# Patient Record
Sex: Male | Born: 1960 | Race: Black or African American | Hispanic: No | Marital: Married | Smoking: Former smoker
Health system: Southern US, Community
[De-identification: ages and names within clinical notes are randomized; demographics above are authoritative.]

## PROBLEM LIST (undated history)

## (undated) DIAGNOSIS — D751 Secondary polycythemia: Secondary | ICD-10-CM

## (undated) DIAGNOSIS — N200 Calculus of kidney: Secondary | ICD-10-CM

## (undated) DIAGNOSIS — D72829 Elevated white blood cell count, unspecified: Secondary | ICD-10-CM

## (undated) DIAGNOSIS — Z720 Tobacco use: Secondary | ICD-10-CM

## (undated) DIAGNOSIS — I10 Essential (primary) hypertension: Secondary | ICD-10-CM

## (undated) DIAGNOSIS — Z972 Presence of dental prosthetic device (complete) (partial): Secondary | ICD-10-CM

## (undated) DIAGNOSIS — F419 Anxiety disorder, unspecified: Secondary | ICD-10-CM

## (undated) DIAGNOSIS — R51 Headache: Secondary | ICD-10-CM

## (undated) DIAGNOSIS — E041 Nontoxic single thyroid nodule: Secondary | ICD-10-CM

## (undated) DIAGNOSIS — D3501 Benign neoplasm of right adrenal gland: Secondary | ICD-10-CM

## (undated) DIAGNOSIS — R6889 Other general symptoms and signs: Secondary | ICD-10-CM

## (undated) DIAGNOSIS — M94269 Chondromalacia, unspecified knee: Secondary | ICD-10-CM

## (undated) DIAGNOSIS — S83249A Other tear of medial meniscus, current injury, unspecified knee, initial encounter: Secondary | ICD-10-CM

## (undated) DIAGNOSIS — E039 Hypothyroidism, unspecified: Secondary | ICD-10-CM

## (undated) DIAGNOSIS — Z87442 Personal history of urinary calculi: Secondary | ICD-10-CM

## (undated) DIAGNOSIS — D649 Anemia, unspecified: Secondary | ICD-10-CM

## (undated) DIAGNOSIS — K219 Gastro-esophageal reflux disease without esophagitis: Secondary | ICD-10-CM

## (undated) DIAGNOSIS — M2560 Stiffness of unspecified joint, not elsewhere classified: Secondary | ICD-10-CM

## (undated) DIAGNOSIS — M1990 Unspecified osteoarthritis, unspecified site: Secondary | ICD-10-CM

## (undated) HISTORY — PX: COLONOSCOPY: SHX174

## (undated) HISTORY — DX: Elevated white blood cell count, unspecified: D72.829

## (undated) HISTORY — PX: INGUINAL HERNIA REPAIR: SHX194

## (undated) HISTORY — DX: Calculus of kidney: N20.0

## (undated) HISTORY — DX: Anemia, unspecified: D64.9

## (undated) HISTORY — PX: ABDOMINAL SURGERY: SHX537

## (undated) HISTORY — DX: Secondary polycythemia: D75.1

## (undated) HISTORY — PX: APPENDECTOMY: SHX54

## (undated) HISTORY — PX: UPPER GASTROINTESTINAL ENDOSCOPY: SHX188

## (undated) HISTORY — PX: EYE SURGERY: SHX253

---

## 1999-11-02 ENCOUNTER — Emergency Department (HOSPITAL_COMMUNITY): Admission: EM | Admit: 1999-11-02 | Discharge: 1999-11-02 | Payer: Self-pay | Admitting: Emergency Medicine

## 2000-02-26 ENCOUNTER — Other Ambulatory Visit: Admission: RE | Admit: 2000-02-26 | Discharge: 2000-02-26 | Payer: Self-pay | Admitting: Gastroenterology

## 2000-02-26 ENCOUNTER — Encounter (INDEPENDENT_AMBULATORY_CARE_PROVIDER_SITE_OTHER): Payer: Self-pay | Admitting: Specialist

## 2000-04-08 ENCOUNTER — Encounter (HOSPITAL_BASED_OUTPATIENT_CLINIC_OR_DEPARTMENT_OTHER): Payer: Self-pay | Admitting: General Surgery

## 2000-04-13 ENCOUNTER — Inpatient Hospital Stay (HOSPITAL_COMMUNITY): Admission: RE | Admit: 2000-04-13 | Discharge: 2000-05-11 | Payer: Self-pay | Admitting: General Surgery

## 2000-04-13 ENCOUNTER — Encounter (INDEPENDENT_AMBULATORY_CARE_PROVIDER_SITE_OTHER): Payer: Self-pay | Admitting: *Deleted

## 2000-04-13 HISTORY — PX: HEMICOLECTOMY: SHX854

## 2000-04-16 ENCOUNTER — Encounter (HOSPITAL_BASED_OUTPATIENT_CLINIC_OR_DEPARTMENT_OTHER): Payer: Self-pay | Admitting: General Surgery

## 2000-04-19 ENCOUNTER — Encounter (HOSPITAL_BASED_OUTPATIENT_CLINIC_OR_DEPARTMENT_OTHER): Payer: Self-pay | Admitting: General Surgery

## 2000-04-20 ENCOUNTER — Encounter (HOSPITAL_BASED_OUTPATIENT_CLINIC_OR_DEPARTMENT_OTHER): Payer: Self-pay | Admitting: General Surgery

## 2000-04-21 ENCOUNTER — Encounter (HOSPITAL_BASED_OUTPATIENT_CLINIC_OR_DEPARTMENT_OTHER): Payer: Self-pay | Admitting: General Surgery

## 2000-04-23 ENCOUNTER — Encounter (HOSPITAL_BASED_OUTPATIENT_CLINIC_OR_DEPARTMENT_OTHER): Payer: Self-pay | Admitting: General Surgery

## 2000-04-24 ENCOUNTER — Encounter (HOSPITAL_BASED_OUTPATIENT_CLINIC_OR_DEPARTMENT_OTHER): Payer: Self-pay | Admitting: General Surgery

## 2000-04-24 HISTORY — PX: SMALL INTESTINE SURGERY: SHX150

## 2000-05-05 ENCOUNTER — Encounter (HOSPITAL_BASED_OUTPATIENT_CLINIC_OR_DEPARTMENT_OTHER): Payer: Self-pay | Admitting: General Surgery

## 2000-09-07 ENCOUNTER — Emergency Department (HOSPITAL_COMMUNITY): Admission: EM | Admit: 2000-09-07 | Discharge: 2000-09-08 | Payer: Self-pay | Admitting: Emergency Medicine

## 2000-09-08 ENCOUNTER — Encounter: Payer: Self-pay | Admitting: Emergency Medicine

## 2001-10-04 ENCOUNTER — Encounter: Payer: Self-pay | Admitting: Gastroenterology

## 2001-10-05 ENCOUNTER — Encounter: Payer: Self-pay | Admitting: Gastroenterology

## 2001-10-05 ENCOUNTER — Encounter: Admission: RE | Admit: 2001-10-05 | Discharge: 2001-10-05 | Payer: Self-pay | Admitting: Gastroenterology

## 2003-10-09 ENCOUNTER — Emergency Department (HOSPITAL_COMMUNITY): Admission: EM | Admit: 2003-10-09 | Discharge: 2003-10-09 | Payer: Self-pay | Admitting: Emergency Medicine

## 2004-08-02 ENCOUNTER — Encounter: Admission: RE | Admit: 2004-08-02 | Discharge: 2004-08-02 | Payer: Self-pay | Admitting: Internal Medicine

## 2004-08-06 ENCOUNTER — Encounter: Admission: RE | Admit: 2004-08-06 | Discharge: 2004-08-06 | Payer: Self-pay | Admitting: Internal Medicine

## 2005-01-06 ENCOUNTER — Ambulatory Visit: Payer: Self-pay | Admitting: Gastroenterology

## 2005-01-13 ENCOUNTER — Ambulatory Visit: Payer: Self-pay | Admitting: Gastroenterology

## 2006-05-04 ENCOUNTER — Ambulatory Visit (HOSPITAL_BASED_OUTPATIENT_CLINIC_OR_DEPARTMENT_OTHER): Admission: RE | Admit: 2006-05-04 | Discharge: 2006-05-04 | Payer: Self-pay | Admitting: Urology

## 2006-05-04 HISTORY — PX: CIRCUMCISION: SHX1350

## 2008-02-01 ENCOUNTER — Emergency Department (HOSPITAL_COMMUNITY): Admission: EM | Admit: 2008-02-01 | Discharge: 2008-02-01 | Payer: Self-pay | Admitting: Emergency Medicine

## 2008-02-01 ENCOUNTER — Ambulatory Visit (HOSPITAL_COMMUNITY): Admission: RE | Admit: 2008-02-01 | Discharge: 2008-02-01 | Payer: Self-pay | Admitting: Family Medicine

## 2008-07-24 ENCOUNTER — Ambulatory Visit: Payer: Self-pay | Admitting: Internal Medicine

## 2008-07-24 ENCOUNTER — Telehealth: Payer: Self-pay | Admitting: Gastroenterology

## 2008-07-24 DIAGNOSIS — Z86010 Personal history of colonic polyps: Secondary | ICD-10-CM | POA: Insufficient documentation

## 2008-07-24 DIAGNOSIS — K219 Gastro-esophageal reflux disease without esophagitis: Secondary | ICD-10-CM | POA: Insufficient documentation

## 2008-07-25 LAB — CONVERTED CEMR LAB
Basophils Absolute: 0.1 10*3/uL (ref 0.0–0.1)
Basophils Relative: 0.4 % (ref 0.0–3.0)
Eosinophils Absolute: 0.1 10*3/uL (ref 0.0–0.7)
Eosinophils Relative: 0.9 % (ref 0.0–5.0)
HCT: 41.5 % (ref 39.0–52.0)
Hemoglobin: 14.3 g/dL (ref 13.0–17.0)
Lymphocytes Relative: 30.7 % (ref 12.0–46.0)
MCHC: 34.5 g/dL (ref 30.0–36.0)
MCV: 84.9 fL (ref 78.0–100.0)
Monocytes Absolute: 0.7 10*3/uL (ref 0.1–1.0)
Monocytes Relative: 5.6 % (ref 3.0–12.0)
Neutro Abs: 8 10*3/uL — ABNORMAL HIGH (ref 1.4–7.7)
Neutrophils Relative %: 62.4 % (ref 43.0–77.0)
Platelets: 303 10*3/uL (ref 150–400)
RBC: 4.89 M/uL (ref 4.22–5.81)
RDW: 13.3 % (ref 11.5–14.6)
WBC: 12.9 10*3/uL — ABNORMAL HIGH (ref 4.5–10.5)

## 2008-12-06 ENCOUNTER — Emergency Department (HOSPITAL_COMMUNITY): Admission: EM | Admit: 2008-12-06 | Discharge: 2008-12-06 | Payer: Self-pay | Admitting: Family Medicine

## 2009-05-01 ENCOUNTER — Inpatient Hospital Stay (HOSPITAL_COMMUNITY): Admission: EM | Admit: 2009-05-01 | Discharge: 2009-05-05 | Payer: Self-pay | Admitting: Emergency Medicine

## 2009-05-01 ENCOUNTER — Encounter (INDEPENDENT_AMBULATORY_CARE_PROVIDER_SITE_OTHER): Payer: Self-pay | Admitting: *Deleted

## 2009-05-02 ENCOUNTER — Encounter (INDEPENDENT_AMBULATORY_CARE_PROVIDER_SITE_OTHER): Payer: Self-pay | Admitting: *Deleted

## 2009-05-03 ENCOUNTER — Encounter (INDEPENDENT_AMBULATORY_CARE_PROVIDER_SITE_OTHER): Payer: Self-pay | Admitting: *Deleted

## 2009-05-04 ENCOUNTER — Encounter (INDEPENDENT_AMBULATORY_CARE_PROVIDER_SITE_OTHER): Payer: Self-pay | Admitting: *Deleted

## 2009-06-25 ENCOUNTER — Ambulatory Visit: Payer: Self-pay | Admitting: Gastroenterology

## 2009-12-06 ENCOUNTER — Encounter (INDEPENDENT_AMBULATORY_CARE_PROVIDER_SITE_OTHER): Payer: Self-pay | Admitting: *Deleted

## 2010-06-08 ENCOUNTER — Emergency Department (HOSPITAL_COMMUNITY): Admission: EM | Admit: 2010-06-08 | Discharge: 2010-06-08 | Payer: Self-pay | Admitting: Emergency Medicine

## 2010-07-16 ENCOUNTER — Ambulatory Visit (HOSPITAL_COMMUNITY): Admission: RE | Admit: 2010-07-16 | Discharge: 2010-07-17 | Payer: Self-pay | Admitting: Orthopedic Surgery

## 2010-07-16 HISTORY — PX: SHOULDER ARTHROSCOPY W/ ROTATOR CUFF REPAIR: SHX2400

## 2010-09-11 ENCOUNTER — Encounter (INDEPENDENT_AMBULATORY_CARE_PROVIDER_SITE_OTHER): Payer: Self-pay | Admitting: *Deleted

## 2010-09-11 ENCOUNTER — Telehealth: Payer: Self-pay | Admitting: Gastroenterology

## 2010-09-18 ENCOUNTER — Encounter (INDEPENDENT_AMBULATORY_CARE_PROVIDER_SITE_OTHER): Payer: Self-pay | Admitting: *Deleted

## 2010-09-25 ENCOUNTER — Ambulatory Visit: Payer: Self-pay | Admitting: Gastroenterology

## 2010-10-07 ENCOUNTER — Telehealth: Payer: Self-pay | Admitting: Gastroenterology

## 2010-10-09 ENCOUNTER — Ambulatory Visit: Payer: Self-pay | Admitting: Gastroenterology

## 2010-10-16 ENCOUNTER — Encounter: Payer: Self-pay | Admitting: Gastroenterology

## 2010-11-26 NOTE — Miscellaneous (Signed)
Summary: [facility] PREvisit/prep  Clinical Lists Changes  Medications: Added new medication of MOVIPREP 100 GM  SOLR (PEG-KCL-NACL-NASULF-NA ASC-C) As per prep instructions. - Signed Rx of MOVIPREP 100 GM  SOLR (PEG-KCL-NACL-NASULF-NA ASC-C) As per prep instructions.;  #1 x 0;  Signed;  Entered by: Wyona Almas RN;  Authorized by: Meryl Dare MD Clementeen Graham;  Method used: Electronically to [id number]*, [address] [place], [place], [place]  [address], Ph: [phone] or [phone], Fax: [phone] Allergies: Changed allergy or adverse reaction from PENICILLIN to PENICILLIN Observations: Added new observation of ALLERGY REV: Done (09/25/2010 13:26)    Prescriptions: MOVIPREP 100 GM  SOLR (PEG-KCL-NACL-NASULF-NA ASC-C) As per prep instructions.  #1 x 0   Entered by:   Wyona Almas RN   Authorized by:   Meryl Dare MD [facility]   Signed by:   Wyona Almas RN on 09/25/2010   Method used:   Electronically to        [facility] [phone]* (retail)       [address]       [place], [place]  [address]       Ph: [phone] or [phone]       Fax: [phone]   RxID:   [id number]

## 2010-11-26 NOTE — Letter (Signed)
Summary: Pre Visit Letter Revised  [facility]  [address] [place], [place] [address]   Phone: [phone]  Fax: [phone]        09/11/2010 MRN: [id number] [name] [name] [address] [place], [place]  [address]             Procedure Date:  10/09/2010  Welcome to the Gastroenterology Division at [facility].    You are scheduled to see a nurse for your pre-procedure visit on 09/23/2010 at 1:00PM on the 3rd floor at [facility], [address]  We ask that you try to arrive at our office 15 minutes prior to your appointment time to allow for check-in.  Please take a minute to review the attached form.  If you answer "Yes" to one or more of the questions on the first page, we ask that you call the person listed at your earliest opportunity.  If you answer "No" to all of the questions, please complete the rest of the form and bring it to your appointment.    Your nurse visit will consist of discussing your medical and surgical history, your immediate family medical history, and your medications.   If you are unable to list all of your medications on the form, please bring the medication bottles to your appointment and we will list them.  We will need to be aware of both prescribed and over the counter drugs.  We will need to know exact dosage information as well.    Please be prepared to read and sign documents such as consent forms, a financial agreement, and acknowledgement forms.  If necessary, and with your consent, a friend or relative is welcome to sit-in on the nurse visit with you.  Please bring your insurance card so that we may make a copy of it.  If your insurance requires a referral to see a specialist, please bring your referral form from your primary care physician.  No co-pay is required for this nurse visit.     If you cannot keep your appointment, please call [phone] to cancel or reschedule prior to your appointment date.  This allows  [name] the opportunity to schedule an appointment for another patient in need of care.    Thank you for choosing [facility] for your medical needs.  We appreciate the opportunity to care for you.  Please visit [name] at our website  to learn more about our practice.  Sincerely, The Gastroenterology Division

## 2010-11-26 NOTE — Letter (Signed)
Summary: Colonoscopy Letter  [facility]  [address] [place], [place] [address]   Phone: [phone]  Fax: [phone]      December 06, 2009 MRN: [id number]   [facility] [address] [place], [place]  [address]   Dear [name] [name],   According to your medical record, it is time for you to schedule a Colonoscopy. The American Cancer Society recommends this procedure as a method to detect early colon cancer. Patients with a family history of colon cancer, or a personal history of colon polyps or inflammatory bowel disease are at increased risk.  This letter has beeen generated based on the recommendations made at the time of your procedure. If you feel that in your particular situation this may no longer apply, please contact our office.  Please call our office at [phone] to schedule this appointment or to update your records at your earliest convenience.  Thank you for cooperating with [name] to provide you with the very best care possible.   Sincerely,  Judie Petit T. Russella Dar, M.D.  [facility] Gastroenterology Division [phone]

## 2010-11-26 NOTE — Letter (Signed)
Summary: [facility] Instructions  [name] [name]  [address] [place], [place] [address]   Phone: [phone]  Fax: [phone]       [name]    01-17-1961    MRN: [id number]        Procedure Day [name] [name]:  [facility]  10/09/10     Arrival Time:  2:00PM     Procedure Time:  3:00PM     [facility] of Procedure:                    [facility]                      PREPARATION FOR COLONOSCOPY WITH MOVIPREP   Starting 5 days prior to your procedure 12/9/11do not eat nuts, seeds, popcorn, corn, beans, peas,  salads, or any raw vegetables.  Do not take any fiber supplements (e.g. Metamucil, Citrucel, and Benefiber).  THE DAY BEFORE YOUR PROCEDURE         DATE: 10/08/10   DAY: TUESDAY  1.  Drink clear liquids the entire day-NO SOLID FOOD  2.  Do not drink anything colored red or purple.  Avoid juices with pulp.  No orange juice.  3.  Drink at least 64 oz. (8 glasses) of fluid/clear liquids during the day to prevent dehydration and help the prep work efficiently.  CLEAR LIQUIDS INCLUDE: Water Jello Ice Popsicles Tea (sugar ok, no milk/cream) Powdered fruit flavored drinks Coffee (sugar ok, no milk/cream) Gatorade Juice: apple, white grape, white cranberry  Lemonade Clear bullion, consomm, broth Carbonated beverages (any kind) Strained chicken noodle soup Hard Candy                             4.  In the morning, mix first dose of MoviPrep solution:    Empty 1 Pouch A and 1 Pouch B into the disposable container    Add lukewarm drinking water to the top line of the container. Mix to dissolve    Refrigerate (mixed solution should be used within 24 hrs)  5.  Begin drinking the prep at 5:00 p.m. The MoviPrep container is divided by 4 marks.   Every 15 minutes drink the solution down to the next [name] [name] (approximately 8 oz) until the full liter is complete.   6.  Follow completed prep with 16 oz of clear liquid of your choice (Nothing  red or purple).  Continue to drink clear liquids until bedtime.  7.  Before going to bed, mix second dose of MoviPrep solution:    Empty 1 Pouch A and 1 Pouch B into the disposable container    Add lukewarm drinking water to the top line of the container. Mix to dissolve    Refrigerate  THE DAY OF YOUR PROCEDURE      DATE: 10/09/10   DAY: WEDNESDAY  Beginning at 10:00AM (5 hours before procedure):         1. Every 15 minutes, drink the solution down to the next [name] [name] (approx 8 oz) until the full liter is complete.  2. Follow completed prep with 16 oz. of clear liquid of your choice.    3. You may drink clear liquids until 1:00PM (2 HOURS BEFORE PROCEDURE).   MEDICATION INSTRUCTIONS  Unless otherwise instructed, you should take regular prescription medications with a small sip of water   as early as possible the morning of  your procedure.          OTHER INSTRUCTIONS  You will need a responsible adult at least 51 years of age to accompany you and drive you home.   This person must remain in the waiting room during your procedure.  Wear loose fitting clothing that is easily removed.  Leave jewelry and other valuables at home.  However, you may wish to bring a book to read or  an iPod/MP3 player to listen to music as you wait for your procedure to start.  Remove all body piercing jewelry and leave at home.  Total time from sign-in until discharge is approximately 2-3 hours.  You should go home directly after your procedure and rest.  You can resume normal activities the  day after your procedure.  The day of your procedure you should not:   Drive   Make legal decisions   Operate machinery   Drink alcohol   Return to work  You will receive specific instructions about eating, activities and medications before you leave.    The above instructions have been reviewed and explained to me by   [name] Almas RN  September 25, 2010 2:08 PM     I fully  understand and can verbalize these instructions _____________________________ Date _________

## 2010-11-26 NOTE — Progress Notes (Signed)
Summary: Wants to schedule ECL asap  Phone Note Call from Patient Call back at cell [phone]   Call For: Dr Russella Dar Reason for Call: Talk to Nurse Summary of Call: Is having alot of indigestion. Wanted to schedule his Colon that is overdue  & wonders if he can get Endo at the same time directly as soon as possible before end of year? Initial call taken by: Leanor Kail [facility],  September 11, 2010 10:12 AM  Follow-up for Phone Call        Left message for patient to call back Darcey Nora RN, [facility]  September 11, 2010 10:14 AM  Patient has 5 month hx of indigestion controlled with Prilosec, but has problems if he skips a dose.  Patient recently had a rotator cuff repair, but says he hasn't been taking or prescribed NSAIDS.  Patient  reports blood in his stool.  He is overdue for his colon.  he would like to schedule both procedures directly.  Dr Russella Dar please advise. Follow-up by: Darcey Nora RN, CGRN,  September 11, 2010 11:11 AM  Additional Follow-up for Phone Call Additional follow up Details #1::        OK for EGD with COLON Additional Follow-up by: Meryl Dare MD Clementeen Graham,  September 11, 2010 11:51 AM    Additional Follow-up for Phone Call Additional follow up Details #2::    Left message for patient to call back Darcey Nora RN, [facility]  September 11, 2010 3:36 PM  Colon/endo scheduled for 10/09/10 3:00 pre-visit 09/23/10 Follow-up by: Darcey Nora RN, CGRN,  September 11, 2010 4:18 PM

## 2010-11-28 NOTE — Progress Notes (Signed)
Summary: Medication  Phone Note Call from Patient Call back at Home Phone [phone]   Caller: Patient Call For: Dr. Russella Dar Reason for Call: Talk to Nurse Summary of Call: Moviprep is too expensive...needs an alternative med Initial call taken by: Karna Christmas,  October 07, 2010 10:49 AM  Follow-up for Phone Call        Pt states he cannot afford the Movi prep. Told pt that he could have the Golytely prep as an alternate prep kit but needs to come by the office today to get new prep instructions. Pt states he will come by at 3:00pm today to get the new instructions.  Follow-up by: Christie Nottingham CMA [facility]ull),  October 07, 2010 12:34 PM    New/Updated Medications: PEG 3350/ELECTROLYTES 240 GM SOLR (PEG 3350-KCL-NABCB-NACL-NASULF) use as directed for preparation for colonoscopy DULCOLAX 5 MG  TBEC (BISACODYL) take as directed REGLAN 10 MG  TABS (METOCLOPRAMIDE HCL) As per prep instructions. Prescriptions: PEG 3350/ELECTROLYTES 240 GM SOLR (PEG 3350-KCL-NABCB-NACL-NASULF) use as directed for preparation for colonoscopy  #1 x 0   Entered by:   Christie Nottingham CMA (AAMA)   Authorized by:   Meryl Dare MD [facility]   Signed by:   Christie Nottingham CMA (AAMA) on 10/07/2010   Method used:   Electronically to        [facility] [phone]* (retail)       [address]       [place], [place]  [address]       Ph: [phone] or [phone]       Fax: [phone]   RxID:   [id number] REGLAN 10 MG  TABS (METOCLOPRAMIDE HCL) As per prep instructions.  #2 x 0   Entered by:   Christie Nottingham CMA (AAMA)   Authorized by:   Meryl Dare MD [facility]   Signed by:   Christie Nottingham CMA (AAMA) on 10/07/2010   Method used:   Electronically to        [facility] [phone]* (retail)       [address]       [place], [place]  [address]       Ph: [phone] or [phone]       Fax: [phone]   RxID:    [id number] DULCOLAX 5 MG  TBEC (BISACODYL) take as directed  #2 x 0   Entered by:   Christie Nottingham CMA (AAMA)   Authorized by:   Meryl Dare MD [facility]   Signed by:   Christie Nottingham CMA (AAMA) on 10/07/2010   Method used:   Electronically to        [facility] [phone]* (retail)       [address]       [place], [place]  [address]       Ph: [phone] or [phone]       Fax: [phone]   RxID:   [id number]

## 2010-11-28 NOTE — Letter (Signed)
Summary: Patient Notice- Colon Biospy Results  [facility]  [address] [place], [place] [address]   Phone: [phone]  Fax: [phone]        October 16, 2010 MRN: [id number]    [facility] [address] [place], [place]  [address]    Dear [name] [name],  I am pleased to inform you that the biopsies taken during your recent colonoscopy and uppper endoscopy did not show any evidence of cancer upon pathologic examination. The biopsies showed nonspecific inflammation of the ileum and normal stomach lining.   Continue with the treatment plan as outlined on the day of your      exam.  You should have a repeat colonoscopy examination in 5 years.  Please call us if you are having persistent problems or have questions about your condition that have not been fully answered at this time.  Sincerely,  Meryl Dare MD [facility]  This letter has been electronically signed by your physician.   Appended Document: Patient Notice- Colon Biospy Results Letter Mailed

## 2010-11-28 NOTE — Miscellaneous (Signed)
Summary: Prescriptions  Clinical Lists Changes  Medications: Added new medication of OMEPRAZOLE 40 MG CPDR (OMEPRAZOLE) take 1 tablet by mouth each morning - Signed Added new medication of ROBINUL 1 MG TABS (GLYCOPYRROLATE) take 1 tablet twice daily as needed for abdominal pain/cramping - Signed Rx of OMEPRAZOLE 40 MG CPDR (OMEPRAZOLE) take 1 tablet by mouth each morning;  #30 x 11;  Signed;  Entered by: Grier Rocher, RN;  Authorized by: Meryl Dare MD Clementeen Graham;  Method used: Electronically to [facility] [phone]*, [address] [place], [place], [place]  [address], Ph: [phone] or [phone], Fax: [phone] Rx of ROBINUL 1 MG TABS (GLYCOPYRROLATE) take 1 tablet twice daily as needed for abdominal pain/cramping;  #60 x 11;  Signed;  Entered by: Grier Rocher, RN;  Authorized by: Meryl Dare MD Clementeen Graham;  Method used: Electronically to [facility] [phone]*, [address] [place], [place], [place]  [address], Ph: [phone] or [phone], Fax: [phone]    Prescriptions: ROBINUL 1 MG TABS (GLYCOPYRROLATE) take 1 tablet twice daily as needed for abdominal pain/cramping  #60 x 11   Entered by:   Grier Rocher, RN   Authorized by:   Meryl Dare MD [facility]   Signed by:   Grier Rocher, RN on 10/09/2010   Method used:   Electronically to        [facility] [phone]* (retail)       [address]       [place], [place]  [address]       Ph: [phone] or [phone]       Fax: [phone]   RxID:   [id number] OMEPRAZOLE 40 MG CPDR (OMEPRAZOLE) take 1 tablet by mouth each morning  #30 x 11   Entered by:   Grier Rocher, RN   Authorized by:   Meryl Dare MD [facility]   Signed by:   Grier Rocher, RN on 10/09/2010   Method used:   Electronically to        [facility] [phone]* (retail)       [address]       [place], [place]  [address]       Ph: [phone] or [phone]       Fax: [phone]   RxID:   [id number]

## 2010-11-28 NOTE — Procedures (Signed)
Summary: Upper Endoscopy  Patient: [name] [name] Note: All result statuses are Final unless otherwise noted.  Tests: (1) Upper Endoscopy (EGD)   EGD Upper Endoscopy       DONE (C)     [facility]     [address]     [place], [place]  [address]           ENDOSCOPY PROCEDURE REPORT           PATIENT:  [name], [name]  MR#:  [id number]     BIRTHDATE:  May 03, 1961, 49 yrs. old  GENDER:  male     ENDOSCOPIST:  Judie Petit T. Russella Dar, MD, [facility]           PROCEDURE DATE:  10/09/2010     PROCEDURE:  EGD with biopsy, [address]     ASA CLASS:  Class II     INDICATIONS:  GERD     MEDICATIONS:  There was residual sedation effect present from     prior procedure, Versed 2 mg IV     TOPICAL ANESTHETIC:  Exactacain Spray     DESCRIPTION OF PROCEDURE:   After the risks benefits and     alternatives of the procedure were thoroughly explained, informed     consent was obtained.  The LB GIF-H180 [id number] endoscope was     introduced through the mouth and advanced to the second portion of     the duodenum, without limitations.  The instrument was slowly     withdrawn as the mucosa was fully examined.     <<PROCEDUREIMAGES>>     Duodenitis was found in the bulb of the duodenum. It was pathcy,     erythematous and mild.  Mild gastritis was found in the total     stomach. It was erythematous and granular. Biopsies of the antrum     and body of the stomach were obtained and sent to pathology. The     esophagus and gastroesophageal junction were completely normal in     appearance.  Otherwise normal duodenum. Retroflexed views revealed     no abnormalities. The scope was then withdrawn from the patient     and the procedure completed.           COMPLICATIONS:  None           ENDOSCOPIC IMPRESSION:     1) Duodenitis in the bulb of duodenum     2) Mild gastritis in the total stomach           RECOMMENDATIONS:     1) Await pathology results     2) Anti-reflux regimen     3) PPI qam: omeprazole 40 mg po  qam, #30, 11 refills     4) glycopyrrolate 1 mg po bid prn abd pain/cramping, #60, 11     refills     5) Consider MAC sedation for future procedures           Malcolm T. Russella Dar, MD, [facility]           n.     REVISED:  10/09/2010 02:23 PM     eSIGNED:   Venita Lick. Stark at 10/09/2010 02:23 PM           [name], [id number]  Note: An exclamation mark (!) indicates a result that was not dispersed into the flowsheet. Document Creation Date: 10/09/2010 2:23 PM _______________________________________________________________________  (1) Order result status: Final Collection or observation date-time: 10/09/2010 14:07 Requested date-time:  Receipt date-time:  Reported date-time:  Referring Physician:   Ordering Physician: Claudette Head [phone]) Specimen Source:  Source: Launa Grill Order Number: [id number] Lab site:

## 2010-11-28 NOTE — Procedures (Signed)
Summary: Colonoscopy  Patient: [name] Note: All result statuses are Final unless otherwise noted.  Tests: (1) Colonoscopy (COL)   COL Colonoscopy           DONE     [facility]     [address]     [place], [place]  [address]           COLONOSCOPY PROCEDURE REPORT     PATIENT:  [name] [name], [name] [name]  MR#:  [id number]     BIRTHDATE:  02-19-1961, 49 yrs. old  GENDER:  male     ENDOSCOPIST:  Judie Petit T. Russella Dar, MD, [facility]           PROCEDURE DATE:  10/09/2010     PROCEDURE:  Colonoscopy with biopsy and snare polypectomy     ASA CLASS:  Class II     INDICATIONS:  1) surveillance and high-risk screening  2) history     of tubulovillous adenomatous colon polyps, 1991.     MEDICATIONS:   Fentanyl 125 mcg IV, Versed 12 mg IV, Benadryl 12.5     mg IV     DESCRIPTION OF PROCEDURE:   After the risks benefits and     alternatives of the procedure were thoroughly explained, informed     consent was obtained.  Digital rectal exam was performed and     revealed no abnormalities.   The LB [id number]-[id number] endoscope     was introduced through the anus and advanced to the terminal ileum     which was intubated for a short distance, without limitations.     The quality of the prep was adequate, using MoviPrep.  The     instrument was then slowly withdrawn as the colon was fully     examined.     <<PROCEDUREIMAGES>>           FINDINGS:  There were inflammatory     changes in the ileum. They were erosive. Multiple biopsies were     obtained and sent to pathology.  The right colon was surgically     resected and an ileo-colonic anastamosis was seen. A sessile polyp     was found in the mid transverse colon. It was 4 mm in size. The     polyp was removed using cold biopsy forceps.  A sessile polyp was     found in the rectum. It was 5 mm in size. Polyp was snared without     cautery. Retrieval was successful. This was otherwise a normal     examination of the     colon. Retroflexed views in the  rectum revealed internal     hemorrhoids, small.  The time to cecum =  3  minutes. The scope     was then withdrawn (time =  9.67  min) from the patient and the     procedure completed.           COMPLICATIONS:  None           ENDOSCOPIC IMPRESSION:     1) Ileitis in the terminal ileum     2) Prior right hemi-colectomy     3) 4 mm sessile polyp     4) 5 mm sessile polyp in the rectum     5) Internal hemorrhoids           RECOMMENDATIONS:     1) Await pathology results     2) Repeat Colonoscopy in 5 years pending pathology review.  Venita Lick. Russella Dar, MD, [facility]           n.     eSIGNED:   Venita Lick. Stark at 10/09/2010 02:04 PM           [name], [id number]  Note: An exclamation mark (!) indicates a result that was not dispersed into the flowsheet. Document Creation Date: 10/09/2010 2:05 PM _______________________________________________________________________  (1) Order result status: Final Collection or observation date-time: 10/09/2010 13:55 Requested date-time:  Receipt date-time:  Reported date-time:  Referring Physician:   Ordering Physician: Claudette Head [phone]) Specimen Source:  Source: Launa Grill Order Number: [id number] Lab site:   Appended Document: Colonoscopy     Procedures Next Due Date:    Colonoscopy: 09/2015

## 2011-01-09 LAB — SURGICAL PCR SCREEN
MRSA, PCR: NEGATIVE
Staphylococcus aureus: NEGATIVE

## 2011-01-09 LAB — BASIC METABOLIC PANEL
BUN: 12 mg/dL (ref 6–23)
CO2: 27 mEq/L (ref 19–32)
Calcium: 9.6 mg/dL (ref 8.4–10.5)
Creatinine, Ser: 0.98 mg/dL (ref 0.4–1.5)
GFR calc Af Amer: >60 mL/min (ref >60)

## 2011-01-10 LAB — POCT URINALYSIS DIPSTICK
Protein, ur: NEGATIVE mg/dL
Specific Gravity, Urine: 1.025 (ref 1.005–1.030)
Urobilinogen, UA: 0.2 mg/dL (ref 0.0–1.0)
pH: 5.5 (ref 5.0–8.0)

## 2011-01-10 LAB — LIPASE, BLOOD: Lipase: 42 U/L (ref 11–59)

## 2011-02-03 LAB — URINALYSIS, ROUTINE W REFLEX MICROSCOPIC
Bilirubin Urine: NEGATIVE
Hgb urine dipstick: NEGATIVE
Nitrite: NEGATIVE
Specific Gravity, Urine: 1.029 (ref 1.005–1.030)
Urobilinogen, UA: 1 mg/dL (ref 0.0–1.0)
pH: 7 (ref 5.0–8.0)

## 2011-02-03 LAB — COMPREHENSIVE METABOLIC PANEL
AST: 26 U/L (ref 0–37)
Albumin: 3.4 g/dL — ABNORMAL LOW (ref 3.5–5.2)
Albumin: 3.9 g/dL (ref 3.5–5.2)
Alkaline Phosphatase: 65 U/L (ref 39–117)
BUN: 15 mg/dL (ref 6–23)
BUN: 8 mg/dL (ref 6–23)
Calcium: 8.8 mg/dL (ref 8.4–10.5)
Calcium: 9.5 mg/dL (ref 8.4–10.5)
Creatinine, Ser: 0.98 mg/dL (ref 0.4–1.5)
Creatinine, Ser: 1.12 mg/dL (ref 0.4–1.5)
GFR calc Af Amer: >60 mL/min (ref >60)
Glucose, Bld: 100 mg/dL — ABNORMAL HIGH (ref 70–99)
Potassium: 3.9 mEq/L (ref 3.5–5.1)
Total Protein: 6.3 g/dL (ref 6.0–8.3)
Total Protein: 7.2 g/dL (ref 6.0–8.3)

## 2011-02-03 LAB — DIFFERENTIAL
Basophils Absolute: 0 10*3/uL (ref 0.0–0.1)
Eosinophils Relative: 1 % (ref 0–5)
Eosinophils Relative: 1 % (ref 0–5)
Lymphocytes Relative: 28 % (ref 12–46)
Lymphocytes Relative: 30 % (ref 12–46)
Lymphs Abs: 3.3 10*3/uL (ref 0.7–4.0)
Lymphs Abs: 3.9 10*3/uL (ref 0.7–4.0)
Monocytes Absolute: 0.8 10*3/uL (ref 0.1–1.0)
Monocytes Absolute: 1 10*3/uL (ref 0.1–1.0)
Monocytes Relative: 7 % (ref 3–12)
Neutro Abs: 7.5 10*3/uL (ref 1.7–7.7)
Neutro Abs: 8.1 10*3/uL — ABNORMAL HIGH (ref 1.7–7.7)

## 2011-02-03 LAB — CBC
HCT: 41.2 % (ref 39.0–52.0)
HCT: 41.2 % (ref 39.0–52.0)
HCT: 44.3 % (ref 39.0–52.0)
Hemoglobin: 13.8 g/dL (ref 13.0–17.0)
Hemoglobin: 14 g/dL (ref 13.0–17.0)
MCHC: 33.5 g/dL (ref 30.0–36.0)
MCHC: 33.8 g/dL (ref 30.0–36.0)
MCV: 85.6 fL (ref 78.0–100.0)
MCV: 86.4 fL (ref 78.0–100.0)
Platelets: 220 10*3/uL (ref 150–400)
Platelets: 250 10*3/uL (ref 150–400)
RBC: 4.85 MIL/uL (ref 4.22–5.81)
RDW: 13.4 % (ref 11.5–15.5)
RDW: 14.1 % (ref 11.5–15.5)
RDW: 14.2 % (ref 11.5–15.5)
WBC: 13.2 10*3/uL — ABNORMAL HIGH (ref 4.0–10.5)

## 2011-02-03 LAB — BASIC METABOLIC PANEL
Calcium: 8.7 mg/dL (ref 8.4–10.5)
GFR calc Af Amer: >60 mL/min (ref >60)
GFR calc non Af Amer: >60 mL/min (ref >60)
Glucose, Bld: 107 mg/dL — ABNORMAL HIGH (ref 70–99)
Potassium: 3.5 mEq/L (ref 3.5–5.1)
Sodium: 137 mEq/L (ref 135–145)

## 2011-02-03 LAB — LIPASE, BLOOD: Lipase: 25 U/L (ref 11–59)

## 2011-02-11 LAB — POCT URINALYSIS DIP (DEVICE)
Glucose, UA: NEGATIVE mg/dL
Nitrite: NEGATIVE

## 2011-02-26 ENCOUNTER — Emergency Department (HOSPITAL_COMMUNITY): Payer: Federal, State, Local not specified - PPO

## 2011-02-26 ENCOUNTER — Emergency Department (HOSPITAL_COMMUNITY)
Admission: EM | Admit: 2011-02-26 | Discharge: 2011-02-26 | Disposition: A | Discharge status: Home / Self Care | Payer: Federal, State, Local not specified - PPO | Attending: Emergency Medicine | Admitting: Emergency Medicine

## 2011-02-26 DIAGNOSIS — M542 Cervicalgia: Secondary | ICD-10-CM | POA: Insufficient documentation

## 2011-02-26 DIAGNOSIS — I10 Essential (primary) hypertension: Secondary | ICD-10-CM | POA: Insufficient documentation

## 2011-02-26 DIAGNOSIS — R51 Headache: Secondary | ICD-10-CM | POA: Insufficient documentation

## 2011-02-26 DIAGNOSIS — H53149 Visual discomfort, unspecified: Secondary | ICD-10-CM | POA: Insufficient documentation

## 2011-02-26 DIAGNOSIS — Z79899 Other long term (current) drug therapy: Secondary | ICD-10-CM | POA: Insufficient documentation

## 2011-02-26 DIAGNOSIS — M79609 Pain in unspecified limb: Secondary | ICD-10-CM | POA: Insufficient documentation

## 2011-03-11 NOTE — H&P (Signed)
[name] [name], [name] [name]                 ACCOUNT NO.:  [id number]   MEDICAL RECORD NO.:  [id number]          PATIENT TYPE:  EMS   LOCATION:  MAJO                         FACILITY:  [facility]   PHYSICIAN:  Ollen Gross. Vernell Morgans, M.D. DATE OF BIRTH:  12-May-1961   DATE OF ADMISSION:  04/30/2009  DATE OF DISCHARGE:                              HISTORY & PHYSICAL   CHIEF COMPLAINT:  Abdominal pain.   HISTORY OF PRESENT ILLNESS:  Mr. Bucklin is a 50 year old black male who  began having diarrhea with some blood in the stool yesterday.  As the  day went on, the diarrhea stopped and then this morning he began having  some pain in his right upper quadrant.  He has not had any nausea or  vomiting associated with it.  He denies any chest pain or shortness of  breath.  It appears as though he did have a similar pain in February  2010 that resolved on its own.   PAST MEDICAL HISTORY:  Significant for tubulovillous adenoma of the  right colon and hypertension.   PAST SURGICAL HISTORY:  Significant for a remote appendectomy and right  colectomy in 2001.  He had to be re-explored 11 days later for a small  bowel obstruction where the anastomosis was redone.   MEDICATIONS:  Atenolol.   ALLERGIES:  MORPHINE which causes itching.   SOCIAL HISTORY:  Smokes tobacco and only occasionally drinks alcohol.   FAMILY HISTORY:  Noncontributory.   PHYSICAL EXAMINATION:  VITAL SIGNS:  Temperature is 97.4, blood pressure  132/80, and pulse 57.  GENERAL:  He is a well-developed, well-nourished black male in no acute  distress.  SKIN:  Warm and dry.  No jaundice.  EYES:  Extraocular movements are intact.  Pupils equal, round, and  reactive to light.  Sclerae are nonicteric.  LUNGS:  Clear bilaterally with no use of accessory respiratory muscles.  HEART:  Regular rate and rhythm with impulse in left chest.  ABDOMEN:  Soft but he has moderate tenderness in the right upper  quadrant, some mild-to-moderate distention.  No  palpable mass or  hepatosplenomegaly.  He has got a large midline scar.  EXTREMITIES:  No cyanosis, clubbing, or edema.  He has good strength in  his arms and legs.  PSYCHOLOGIC:  He is alert and oriented x3 with no evidence of anxiety or  depression.   LABORATORY DATA:  On review of his lab work, it is significant for a  white count of 13.2.  CT scan shows some dilated small bowel with stool-  like changes in the small bowel as was at the anastomosis.   ASSESSMENT AND PLAN:  This is a 50 year old black male with what looks  like some chronic at least partial obstruction of the small bowel  causing some stool-like changes.  We plan to admit him to the hospital,  put him on  bowel rest, and IV hydration.  We will repeat his abdominal x-rays and  labs here in the morning.  If he does not improve, he may need to have  surgery to relieve the  obstruction in the next 24-48 hours.  He is  little apprehensive about surgery since he had such a hard time with his  previous surgery back in 2001.      Ollen Gross. Vernell Morgans, M.D.  Electronically Signed     PST/MEDQ  D:  05/01/2009  T:  05/01/2009  Job:  [id number]

## 2011-03-11 NOTE — Discharge Summary (Signed)
[name] [name], [name] [name]                 ACCOUNT NO.:  [id number]   MEDICAL RECORD NO.:  [id number]          PATIENT TYPE:  INP   LOCATION:  5023                         FACILITY:  [facility]   PHYSICIAN:  Sharlet Salina T. Hoxworth, M.D.DATE OF BIRTH:  1961/05/20   DATE OF ADMISSION:  04/30/2009  DATE OF DISCHARGE:                               DISCHARGE SUMMARY   CHIEF COMPLAINT/REASON FOR ADMISSION:  [name] [name] is a 50 year old  African American male, who began having diarrhea with blood in his  stool, 24 hours prior to presentation when the diarrhea stopped, he  began having right upper quadrant pain, and this started on the date of  admission.  No nausea and vomiting.  Similar symptoms in February of  this year that resolved independently of any treatment.  The patient has  a past medical history of a complicated hospitalization involving right  colectomy and tubulovillous adenoma with return to the OR 11 days later  for small bowel obstruction and redo of the anastomosis.  On initial  exam, the patient's abdomen was soft, but moderately tender in the right  upper quadrant, some mild-to-moderate distention, midline scar without  hernia.  White count was 13,200.  CT showed dilated small bowel with  stool-like changes in the small bowel at the anastomosis concerning for  small bowel obstruction related to an anastomotic stricture with  associated fecalization in the small bowel.   ADMITTING DIAGNOSES:  1. Small bowel obstruction, question anastomotic stricture.  2. Fecalization of small bowel, questionable chronic constipation.   HOSPITAL COURSE:  The patient was admitted by Dr. Carolynne Edouard, placed on n.p.o.  status, NG tube was placed, IV fluids were initiated, and he was  followed closely.  For the first 24-48 hours, the patient did have  significant right upper quadrant abdominal pain, but bowel sounds were  present.  His white count slowly trended down to normal.  Followup x-  rays eventually showed  resolution of the small bowel obstruction pattern  with air in the colon noted by hospital day #3.  His NG tube was  discontinued and he was started on clears.  On the same day, the patient  reports having 1 stool that had a lot of hard brick-like pieces in it,  but he was passing flatus and tolerating clears.   By hospital day #4, the patient's diet was advanced to full liquids.  His abdomen was soft.  He was having bowel sounds and flatus.  He had a  diffuse report of soreness in his abdomen, but abdomen was nontender on  palpation.  Plans were to advance the patient's diet over the next 24-48  hours and if tolerates solid diet without any difficulties, plans were  to discharge home.  On hospital day #4, when diet was advanced to full  liquids, Colace was added, and plans were to continue this after  discharge with possibility of adding MiraLax if needed.   FINAL DISCHARGE DIAGNOSES:  1. Small bowel obstruction localized to the prior surgical anastomosis      with associated fecalization of the small bowel resolving at time  of dictation.  2. 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  3. Prior abdominal surgeries as described in the history of present      illness without any abdominal wall or incisional hernias.  4. Hypertension.   DISCHARGE MEDICATIONS:  The patient will resume his usual home  medications as follows;  1. Atenolol 30 mg daily.  2. Vitamin B daily.  3. Cialis p.r.n.  4. Fish oil.   New medications at the time of dictation include Colace, over the  counter 100 mg 1-2 times daily to prevent constipation.   DIET:  Advanced to soft diet.  Continue this for 1 week and then diet as  prior to admission.  Return to work within 7 days after discharge and  that will be given in specific instructions at the time of discharge.   ACTIVITY:  No restrictions in activity.   FOLLOWUP CARE:  1. The patient needs to notify his family  doctor for routine followup      in 2 weeks.  This may be necessary to assist in managing his      obstipation better.  2. He is to follow up with Dr. Johna Sheriff of Surgery p.r.n. if this      problem recurs.       Allison L. Rennis Harding, N.P.      Lorne Skeens. Hoxworth, M.D.  Electronically Signed    ALE/MEDQ  D:  05/04/2009  T:  05/04/2009  Job:  [id number]

## 2011-03-14 NOTE — Op Note (Signed)
[place]. [facility]  Patient:    [name] [name], [name] [name]                        MRN: [id number] Proc. Date: 04/13/00 Adm. Date:  [id number] Attending:  Sonda Primes Dictator:   Mardene Celeste Lurene Shadow, M.D. CC:         Mardene Celeste. Lurene Shadow, M.D. 2 copies                           Operative Report  PREOPERATIVE DIAGNOSIS:  Tubulovillious adenoma of the cecum.  POSTOPERATIVE DIAGNOSIS:  Tubulovillious adenoma of the cecum.  PATHOLOGY:  Pending.  PROCEDURE:  Right hemicolectomy with ileocolic anastomosis.  SURGEON:  Dr. Lurene Shadow.  ASSISTANT: 1. Marnee Spring. Wiliam Ke, M.D. 2. Rubye Oaks, P.A. student.  ANESTHESIA:  General.  HISTORY OF PRESENT ILLNESS:  The patient is a 50 year old man presenting with some hematochezia and underwent colonoscopy which was notable for a large (4 cm) villious tumor in the cecum, and biopsy of this shows a tubulovillious adenoma without any evidence of a ______.  Because of the large sessile nature of the tumor, the patient is referred for right-sided hemicolectomy.  DESCRIPTION OF PROCEDURE:  Following the induction of satisfactory general anesthetic with the patient positioned supinely, the abdomen was doubly prepped and draped to be included in the sterile operative field.  Exploratory laparotomy carried out through a midline incision, deepened through the skin and subcutaneous tissue through the linea alba into the peritoneum. Exploration of the abdomen showed no evidence of hepatic metastases.  The tumor could be palpated within the cecum.  There was no evidence of extension to the serosal surface.  I did not feel any significant nodes within the mesentery.  There was no peritoneal seeding noted.  There were multiple adhesions from a previous appendectomy down into the pelvis and right lower quadrant that were lysed, and then dissection was begun at the cecum, carrying dissection along the retroperitoneal reflection, mobilizing the  ileum, and along the line of Toldt up to the hepatic flexure. The right ureter was protected throughout the course of dissection.  Duodenum was also protected throughout the course of dissection.  Hepatic flexure taken down between clamps and secured with ties of 2-0 silk.  The gastric colic omentum taken down to the region of the middle colic vessels.  The colon was transected with a GIA 55 stapler, as was the distal ileum.  The intervening mesentery was then taken between clamps and secured with ties of 2-0 silk. Right colic vessels were doubly clamped and tied with 0 silk sutures. Specimens were removed from the operative field for pathologic evaluation.  I did open up the specimen and noted the margins around the tumor to be excellent.  I then accomplished a functional end-to-end anastomosis using a GIA stapler and TA55 stapling device with widely patent anastomosis.  The intervening mesenteric defect was then closed with interrupted 3-0 silk sutures.  All areas of dissection were then checked for hemostasis. Hemostasis was noted to be excellent.  Sponge, instrument, and Sharp counts verified.  The wound was then closed in layers as follows:  The midline closed with a running suture of #1 PDS, subcutaneous tissues irrigated, and the skin closed with staples.  Sterile dressings applied.  Anesthetic reversed.  The patient was removed from the operating room to the recovery room in stable condition, having tolerated the procedure well. DD:  04/13/00 TD:  04/15/00 Job: [id number]

## 2011-03-14 NOTE — Op Note (Signed)
[place] [facility]  Patient:    [name] [name], [name] [name]                        MRN: [id number] Proc. Date: 04/24/00 Adm. Date:  [id number] Attending:  Sonda Primes CC:         Mardene Celeste. Lurene Shadow, M.D. (2 copies)                           Operative Report  PREOPERATIVE DIAGNOSIS:  Small bowel obstruction at anastomosis.  POSTOPERATIVE DIAGNOSIS:  Adhesive small bowel obstruction proximal to the ileocolic anastomosis.  PROCEDURE:  Adhesiolysis, small bowel resection, and ileocolic anastomosis.  SURGEON:  Mardene Celeste. Lurene Shadow, M.D.  ASSISTANT:  Marnee Spring. Wiliam Ke, M.D.  ANESTHESIA:  General.  HISTORY:  The patient is a 50 year old man who originally presented with a large villous adenoma off the cecum.  He was prepared and brought to the operating room and underwent right-sided hemicolectomy.  During the postoperative period, he developed recurrent distention, nausea and vomiting. He was treated with nasogastric decompression and watchful waiting without any resolution of his small bowel obstructive symptoms.  Because of his increasing symptoms he has returned to the operating room for exploratory laparotomy.  PROCEDURE IN DETAIL:  Following the induction of anesthesia, the patient was positioned supinely and the abdomen prepped and draped to be included in a sterile operative field.  Exploratory laparotomy was carried out through a midline incision extending inferiorly and superiorly from his original incision.  This was done down through the skin and subcutaneous tissues and through the linea alba where the previous placed stitches were removed.  The abdomen was entered and there were very thick concretions of adhesions in all areas with very massive small bowel distention extending from the duodenum to the region just proximal to the ileocolic anastomosis.  The patient underwent rather extensive adhesiolysis, freeing the severely obstructed bowel from  the retroperitoneum and from the pelvis and this was taken down.  Once this was taken down we used a GIA stapler to transect the proximal transverse colon and portion off the distal small bowel, thus removing approximately 2 to 2-1/2 feet of the distal ileum.  We then constructed a functional end-to-end anastomosis using a GIA stapler and a TA-60 stapling device closing the mesenteric defect with interrupted 3-0 silk sutures.  We then thoroughly irrigated the entire abdominal cavity.  We checked all the areas of hemostasis to assure no additional bleeding.  Attention was then turned to the stomach where the stomach was brought up, decompressed and a tubing for gastrostomy tube was placed by placing a pressing suture into the anterior gastric wall making a gastrostomy and placing a size 22 French Foley catheter into the stomach.  The first suture line was drawn taut and tied and the second suture line inverting the first was placed with 2-0 silk and that secured the gastrostomy tube in the stomach. The tubing was brought out onto the anterior abdominal wall on the left side and the stomach was sutured up to the parietes using interrupted 2-0 silk sutures. Again, all areas of dissection were checked for hemostasis and noted to be dry. Sponge, instrument and sharp counts were verified.  Wound closed in layers as follows:  the midline fascia was closed a running #1 Novofil suture and the subcutaneous tissues irrigated and the skin closed with staples.  A sterile dressing was applied. Anesthetic  reversed.  The patient was moved from the operating room to the recovery room in stable condition having tolerated the procedure well. DD:  04/24/00 TD:  04/25/00 Job: [id number]

## 2011-03-14 NOTE — Op Note (Signed)
[name] [name], [name] [name]                 ACCOUNT NO.:  [id number]   MEDICAL RECORD NO.:  [id number]          PATIENT TYPE:  AMB   LOCATION:  [facility]C                         FACILITY:  [facility]   PHYSICIAN:  Lindaann Slough, M.D.  DATE OF BIRTH:  May 01, 1961   DATE OF PROCEDURE:  05/04/2006  DATE OF DISCHARGE:                                 OPERATIVE REPORT   PREOPERATIVE DIAGNOSES:  Phimosis and balanitis.   POSTOPERATIVE DIAGNOSES:  Phimosis and balanitis.   PROCEDURE:  Circumcision.   SURGEON:  Danae Chen, M.D.   ANESTHESIA:  General.   INDICATIONS:  The patient is a 50 years old male who was been complaining of  difficulty retracting his foreskin.  He also has been having multiple  breakdowns of the foreskin.  He was found on physical examination to have  phimosis and balanitis.  He is scheduled today for circumcision.   Under general anesthesia, the patient was prepped and draped and placed in  the supine position. A penile block was done with 0.25% Marcaine. Then two  circumferential incisions were made on the foreskin and the foreskin in  between those two incisions was excised.  Hemostasis was secured with  electrocautery.  Skin approximation was then done with #4-0 chromic.   The patient tolerated the procedure well and left the OR in satisfactory  condition to post anesthesia care unit.      Lindaann Slough, M.D.  Electronically Signed     MN/MEDQ  D:  05/04/2006  T:  05/04/2006  Job:  [id number]

## 2011-03-14 NOTE — Discharge Summary (Signed)
[place]. [facility]  Patient:    [name] [name], [name] [name]                        MRN: [id number] Adm. Date:  [id number] Disc. Date: [id number] Attending:  Sonda Primes CC:         Mardene Celeste. Lurene Shadow, M.D. (2 copies)                           Discharge Summary  ADMISSION DIAGNOSIS:  Tumor of the cecum.  DISCHARGE DIAGNOSES: 1. Tumor of the cecum. 2. Postoperative small-bowel obstruction. 3. Wound infection.  PROCEDURES: 1. Right hemicolectomy on April 13, 2000. 2. Exploratory laparotomy with adhesiolysis and small-bowel resection on    April 24, 2000. 3. Total parenteral nutrition started on April 20, 2000, until May 09, 2000.  COMPLICATIONS: 1. Postoperative bowel obstruction. 2. Superficial wound infections with growth of Pseudomonas aeruginosa. 3. Candidal infection of the glans penis and ______ .  CONDITION ON DISCHARGE:  Improved.  HOSPITAL COURSE:  This patient is a 50 year old African-American gentleman who was noted to have some hematochezia, and on colonoscopy was noted to have a very large sessile villous tumor off the cecum and ascending colon.  He was referred for evaluation and right hemicolectomy after biopsies did not show any tumor.  The patient was admitted to the hospital on April 13, 2000, where he underwent an exploratory laparotomy and right-sided hemicolectomy, final pathology showing a villous tumor without any evidence of carcinoma.  His postoperative course was complicated by prolonged abdominal distention with chronic abdominal pain.  He began having some bowel sounds and some flatus; however, continued to have distention and abdominal pain.  A KUB of the abdomen was consistent with a partial small-bowel obstruction, and Gastrografin enema was taken to reevaluate the area of the anastomosis and showed the anastomosis to be patent.  Because of the patients prolonged hospital course, at day #7, he had a central line placed and the  patient started on total parenteral nutrition for nutritional enhancement.  We, however, continued to wait for his partial obstruction to resolve.  This did not occur, and on April 24, 2000, he was returned to the operating room where he underwent exploratory laparotomy with an extensive adhesiolysis and small-bowel resection for portion of small bowel that was proximal to the anastomosis.  The entire anastomosis was re-resected and re-anastomosed.  His second postoperative course was marked by a prolonged ileus, during which time he did have significant hypovolemia and associated tachycardia.  He also developed a fever.  He had been on empiric Unasyn and was switched to vancomycin; however, the region of infection revealed itself to be a superficial wound infection which was opened up, and cultures showed Pseudomonas aeruginosa.  The patient was switched to ciprofloxacin IV, with good resolution of his fever and tachycardia.  Currently, the wound has continued to be irrigated and packed on a daily basis.  He eventually began to have some bowel activity and, at the time of discharge, was having frequent stools and normal passage of flatus.  His abdomen is not distended.  Bowel sounds are normal and active.  At the time of discharge his laboratory evaluations are as follows.  LABORATORY DATA:  CBC shows a white count of 6.6, a hemoglobin of 9.7, hematocrit of 29, and a platelet count of 291.  Showing 58% neutrophils and 25% lymphocytes.  Electrolytes show a sodium  of 138, potassium 4.0, chloride 103, CO2 27, glucose 108, BUN 15, and creatinine 0.7.  During the course of his TNA, he did have some elevation of his liver function studies which are now returning to normal.  Most recent serum magnesium was 1.8 with a phosphorus of 4.5.  FOLLOW-UP:  He is being discharged now to be followed up in the office in one week.  DISCHARGE MEDICATIONS: 1. Ciprofloxacin 200 mg p.o. b.i.d., continue for  five days. 2. Flagyl 500 mg p.o. t.i.d., continue for five days. 3. Percocet 1-2 every four hours p.r.n. for pain.  ACTIVITY:  As tolerated, with restrictions on lifting no greater than 10-15 pounds.  WOUND CARE:  Will be continued by his wife, who is a Designer, jewellery.  This will be effective by daily showers in order to cleanse the wound and then wet-to-dry gauze packings done daily.  DIET:  Unrestricted. DD:  05/11/00 TD:  05/11/00 Job: [id number]

## 2011-03-31 ENCOUNTER — Other Ambulatory Visit: Payer: Self-pay | Admitting: Gastroenterology

## 2012-08-03 ENCOUNTER — Ambulatory Visit: Payer: Self-pay | Attending: Family Medicine

## 2012-08-03 DIAGNOSIS — IMO0001 Reserved for inherently not codable concepts without codable children: Secondary | ICD-10-CM | POA: Insufficient documentation

## 2012-08-03 DIAGNOSIS — M25569 Pain in unspecified knee: Secondary | ICD-10-CM | POA: Insufficient documentation

## 2012-08-03 DIAGNOSIS — R5381 Other malaise: Secondary | ICD-10-CM | POA: Insufficient documentation

## 2012-08-03 DIAGNOSIS — M6281 Muscle weakness (generalized): Secondary | ICD-10-CM | POA: Insufficient documentation

## 2012-08-03 DIAGNOSIS — R269 Unspecified abnormalities of gait and mobility: Secondary | ICD-10-CM | POA: Insufficient documentation

## 2012-08-11 ENCOUNTER — Ambulatory Visit: Payer: Self-pay

## 2012-08-13 ENCOUNTER — Ambulatory Visit: Payer: Self-pay

## 2012-08-17 ENCOUNTER — Ambulatory Visit: Payer: Self-pay | Admitting: Physical Therapy

## 2012-08-19 ENCOUNTER — Ambulatory Visit: Payer: Self-pay

## 2012-08-23 ENCOUNTER — Other Ambulatory Visit (HOSPITAL_COMMUNITY): Payer: Self-pay | Admitting: Neurosurgery

## 2012-08-23 ENCOUNTER — Ambulatory Visit (HOSPITAL_COMMUNITY)
Admission: RE | Admit: 2012-08-23 | Discharge: 2012-08-23 | Disposition: A | Discharge status: Home / Self Care | Payer: Self-pay | Source: Ambulatory Visit | Attending: Neurosurgery | Admitting: Neurosurgery

## 2012-08-23 DIAGNOSIS — M542 Cervicalgia: Secondary | ICD-10-CM

## 2012-08-23 DIAGNOSIS — E041 Nontoxic single thyroid nodule: Secondary | ICD-10-CM | POA: Insufficient documentation

## 2012-08-23 DIAGNOSIS — M5030 Other cervical disc degeneration, unspecified cervical region: Secondary | ICD-10-CM | POA: Insufficient documentation

## 2012-09-13 ENCOUNTER — Other Ambulatory Visit (HOSPITAL_COMMUNITY): Payer: Self-pay | Admitting: Family Medicine

## 2012-09-13 DIAGNOSIS — E041 Nontoxic single thyroid nodule: Secondary | ICD-10-CM

## 2012-09-16 ENCOUNTER — Ambulatory Visit (HOSPITAL_COMMUNITY)
Admission: RE | Admit: 2012-09-16 | Discharge: 2012-09-16 | Disposition: A | Discharge status: Home / Self Care | Payer: Self-pay | Source: Ambulatory Visit | Attending: Family Medicine | Admitting: Family Medicine

## 2012-09-16 DIAGNOSIS — E041 Nontoxic single thyroid nodule: Secondary | ICD-10-CM

## 2013-01-18 ENCOUNTER — Other Ambulatory Visit: Payer: Self-pay | Admitting: Neurosurgery

## 2013-02-14 ENCOUNTER — Encounter (HOSPITAL_COMMUNITY): Payer: Self-pay | Admitting: Pharmacy Technician

## 2013-02-17 ENCOUNTER — Inpatient Hospital Stay (HOSPITAL_COMMUNITY): Admission: RE | Admit: 2013-02-17 | Payer: Federal, State, Local not specified - PPO | Source: Ambulatory Visit

## 2013-02-23 ENCOUNTER — Other Ambulatory Visit: Payer: Self-pay | Admitting: Neurosurgery

## 2013-02-24 ENCOUNTER — Ambulatory Visit (HOSPITAL_COMMUNITY): Admission: RE | Admit: 2013-02-24 | Payer: Self-pay | Source: Ambulatory Visit | Admitting: Neurosurgery

## 2013-02-24 ENCOUNTER — Encounter (HOSPITAL_COMMUNITY): Admission: RE | Payer: Self-pay | Source: Ambulatory Visit

## 2013-02-24 SURGERY — ANTERIOR CERVICAL DECOMPRESSION/DISCECTOMY FUSION 3 LEVELS
Anesthesia: General | Site: Neck

## 2013-02-25 ENCOUNTER — Other Ambulatory Visit: Payer: Self-pay | Admitting: Family Medicine

## 2013-02-25 DIAGNOSIS — E041 Nontoxic single thyroid nodule: Secondary | ICD-10-CM

## 2013-03-03 ENCOUNTER — Ambulatory Visit (HOSPITAL_COMMUNITY)
Admission: RE | Admit: 2013-03-03 | Discharge: 2013-03-03 | Disposition: A | Discharge status: Home / Self Care | Payer: Self-pay | Source: Ambulatory Visit | Attending: Family Medicine | Admitting: Family Medicine

## 2013-03-03 DIAGNOSIS — E041 Nontoxic single thyroid nodule: Secondary | ICD-10-CM | POA: Insufficient documentation

## 2013-03-09 ENCOUNTER — Other Ambulatory Visit: Payer: Self-pay | Admitting: Family Medicine

## 2013-03-09 ENCOUNTER — Encounter (HOSPITAL_COMMUNITY)
Admission: RE | Admit: 2013-03-09 | Discharge: 2013-03-09 | Disposition: A | Discharge status: Home / Self Care | Payer: Medicaid Other | Source: Ambulatory Visit | Attending: Neurosurgery | Admitting: Neurosurgery

## 2013-03-09 ENCOUNTER — Encounter (HOSPITAL_COMMUNITY)
Admission: RE | Admit: 2013-03-09 | Discharge: 2013-03-09 | Disposition: A | Discharge status: Home / Self Care | Payer: Medicaid Other | Source: Ambulatory Visit | Attending: Anesthesiology | Admitting: Anesthesiology

## 2013-03-09 ENCOUNTER — Encounter (HOSPITAL_COMMUNITY): Payer: Self-pay

## 2013-03-09 DIAGNOSIS — E041 Nontoxic single thyroid nodule: Secondary | ICD-10-CM

## 2013-03-09 HISTORY — DX: Anxiety disorder, unspecified: F41.9

## 2013-03-09 HISTORY — DX: Nontoxic single thyroid nodule: E04.1

## 2013-03-09 HISTORY — DX: Gastro-esophageal reflux disease without esophagitis: K21.9

## 2013-03-09 HISTORY — DX: Headache: R51

## 2013-03-09 LAB — BASIC METABOLIC PANEL
GFR calc Af Amer: >90 mL/min (ref >90)
GFR calc non Af Amer: >90 mL/min (ref >90)
Glucose, Bld: 96 mg/dL (ref 70–99)
Potassium: 3.3 mEq/L — ABNORMAL LOW (ref 3.5–5.1)
Sodium: 136 mEq/L (ref 135–145)

## 2013-03-09 LAB — CBC
Hemoglobin: 13.1 g/dL (ref 13.0–17.0)
RBC: 4.85 MIL/uL (ref 4.22–5.81)

## 2013-03-09 LAB — SURGICAL PCR SCREEN: MRSA, PCR: NEGATIVE

## 2013-03-09 NOTE — Pre-Procedure Instructions (Signed)
[name] [name]  03/09/2013   Your procedure is scheduled on:  *03-18-2013   Friday   Report to [facility] at 7:00 AM.   Call this number if you have problems the morning of surgery: [phone]   Remember:   Do not eat food or drink liquids after midnight.    Take these medicines the morning of surgery with A SIP OF WATER: atenolol-chlorthalidone,lorazepam as needed Omeprazole,pain medication as needed             Stop all Aspirin,NSAIDS(motrin,advil), blood thinners,and herbal supplements   Do not wear jewelry,   Do not wear lotions, powders, or perfumes.   Do not shave 48 hours prior to surgery. Men may shave face and neck.  Do not bring valuables to the hospital.  Contacts, dentures or bridgework may not be worn into surgery.  Leave suitcase in the car. After surgery it may be brought to your room.   For patients admitted to the hospital, checkout time is 11:00 AM the day of discharge.   Patients discharged the day of surgery will not be allowed to drive home.   Special Instructions: Shower using CHG 2 nights before surgery and the night before surgery.  If you shower the day of surgery use CHG.  Use special wash - you have one bottle of CHG for all showers.  You should use approximately 1/3 of the bottle for each shower.   Please read over the following fact sheets that you were given: Pain Booklet and Surgical Site Infection Prevention

## 2013-03-09 NOTE — Progress Notes (Signed)
This patient has tested at an elevated risk for obstructive Sleep Apnea using the STOP Bang tool during a pre-surgical visit. A score of 4 or greater is an elevated risk.

## 2013-03-10 NOTE — Progress Notes (Signed)
This is a 52 year old male who is scheduled for multi-level cervical spine surgery to be performed  By Dr. Maeola Harman on Friday 18 Mar 2013. He was noted to be hypokalemic at 3.3 (3.5-5.1) via his pre-anesthesia lab results dated 09 Mar 2013. Current medications include Tenoretic (Atenolol-Chlorthalidone) 100-25 daily for hypertension. He is not currently taking a potassium supplement. I left a message at Dr. Fredrich Birks office to advise them of this patient's abnormal lab results. An I-STAT has been ordered for DOS.

## 2013-03-11 ENCOUNTER — Ambulatory Visit (HOSPITAL_COMMUNITY)
Admission: RE | Admit: 2013-03-11 | Discharge: 2013-03-11 | Disposition: A | Discharge status: Home / Self Care | Payer: Medicaid Other | Source: Ambulatory Visit | Attending: Family Medicine | Admitting: Family Medicine

## 2013-03-11 DIAGNOSIS — E041 Nontoxic single thyroid nodule: Secondary | ICD-10-CM

## 2013-03-11 NOTE — Procedures (Signed)
US guided FNA x4 left lower pole thyroid nodule via 25 gauge needles. Pathology pending. No immediate complications.

## 2013-03-12 DIAGNOSIS — Z79899 Other long term (current) drug therapy: Secondary | ICD-10-CM | POA: Insufficient documentation

## 2013-03-12 DIAGNOSIS — Y9301 Activity, walking, marching and hiking: Secondary | ICD-10-CM | POA: Insufficient documentation

## 2013-03-12 DIAGNOSIS — I10 Essential (primary) hypertension: Secondary | ICD-10-CM | POA: Insufficient documentation

## 2013-03-12 DIAGNOSIS — Y9289 Other specified places as the place of occurrence of the external cause: Secondary | ICD-10-CM | POA: Insufficient documentation

## 2013-03-12 DIAGNOSIS — S40019A Contusion of unspecified shoulder, initial encounter: Secondary | ICD-10-CM | POA: Insufficient documentation

## 2013-03-12 DIAGNOSIS — F411 Generalized anxiety disorder: Secondary | ICD-10-CM | POA: Insufficient documentation

## 2013-03-12 DIAGNOSIS — S46909A Unspecified injury of unspecified muscle, fascia and tendon at shoulder and upper arm level, unspecified arm, initial encounter: Secondary | ICD-10-CM | POA: Insufficient documentation

## 2013-03-12 DIAGNOSIS — Z862 Personal history of diseases of the blood and blood-forming organs and certain disorders involving the immune mechanism: Secondary | ICD-10-CM | POA: Insufficient documentation

## 2013-03-12 DIAGNOSIS — Z8639 Personal history of other endocrine, nutritional and metabolic disease: Secondary | ICD-10-CM | POA: Insufficient documentation

## 2013-03-12 DIAGNOSIS — K219 Gastro-esophageal reflux disease without esophagitis: Secondary | ICD-10-CM | POA: Insufficient documentation

## 2013-03-12 DIAGNOSIS — Z880 Allergy status to penicillin: Secondary | ICD-10-CM | POA: Insufficient documentation

## 2013-03-12 DIAGNOSIS — S4980XA Other specified injuries of shoulder and upper arm, unspecified arm, initial encounter: Secondary | ICD-10-CM | POA: Insufficient documentation

## 2013-03-12 DIAGNOSIS — F17200 Nicotine dependence, unspecified, uncomplicated: Secondary | ICD-10-CM | POA: Insufficient documentation

## 2013-03-12 DIAGNOSIS — W1789XA Other fall from one level to another, initial encounter: Secondary | ICD-10-CM | POA: Insufficient documentation

## 2013-03-12 NOTE — ED Notes (Signed)
Pt tripped and fell on his right side. Landed on concrete. C/o rt shoulder, right thoracic, and right neck pain. Scheduled to have surgery on Friday on Rt shoulder. Pt states he can feel pressure when digits are touched but not like on his left hand. Skin warm and dry, can wiggle digits, color normal for race, cap refill less than 2 seconds.

## 2013-03-13 ENCOUNTER — Emergency Department (HOSPITAL_COMMUNITY): Payer: Medicaid Other

## 2013-03-13 ENCOUNTER — Emergency Department (HOSPITAL_COMMUNITY)
Admission: EM | Admit: 2013-03-13 | Discharge: 2013-03-13 | Disposition: A | Discharge status: Home / Self Care | Payer: Medicaid Other | Attending: Emergency Medicine | Admitting: Emergency Medicine

## 2013-03-13 DIAGNOSIS — S4991XA Unspecified injury of right shoulder and upper arm, initial encounter: Secondary | ICD-10-CM

## 2013-03-13 DIAGNOSIS — T148XXA Other injury of unspecified body region, initial encounter: Secondary | ICD-10-CM

## 2013-03-13 MED ORDER — METHOCARBAMOL 500 MG PO TABS
500.0000 mg | ORAL_TABLET | Freq: Once | ORAL | Status: AC
  Administered 2013-03-13: 500 mg via ORAL
  Filled 2013-03-13: qty 1

## 2013-03-13 MED ORDER — METHOCARBAMOL 750 MG PO TABS: 750.0000 mg | ORAL_TABLET | Freq: Two times a day (BID) | ORAL | Status: DC

## 2013-03-13 MED ORDER — OXYCODONE-ACETAMINOPHEN 5-325 MG PO TABS
1.0000 | ORAL_TABLET | Freq: Once | ORAL | Status: AC
  Administered 2013-03-13: 1 via ORAL
  Filled 2013-03-13: qty 1

## 2013-03-13 MED ORDER — OXYCODONE-ACETAMINOPHEN 5-325 MG PO TABS: 1.0000 | ORAL_TABLET | Freq: Four times a day (QID) | ORAL | Status: DC | PRN

## 2013-03-13 MED ORDER — METHOCARBAMOL 500 MG PO TABS
1000.0000 mg | ORAL_TABLET | Freq: Once | ORAL | Status: AC
  Administered 2013-03-13: 500 mg via ORAL
  Filled 2013-03-13: qty 1

## 2013-03-13 NOTE — ED Provider Notes (Signed)
History     CSN: [id number]  Arrival date & time 03/12/13  2306   First MD Initiated Contact with Patient 03/13/13 [phone]      Chief Complaint  Patient presents with  . Fall    (Consider location/radiation/quality/duration/timing/severity/associated sxs/prior treatment) Patient is a 52 y.o. male presenting with fall. The history is provided by the patient. No language interpreter was used.  Fall The accident occurred 3 to 5 hours ago. The fall occurred while walking. He fell from a height of 1 to 2 ft. He landed on concrete. There was no blood loss. The point of impact was the right shoulder. The pain is present in the right shoulder. The pain is at a severity of 10/10. The pain is severe. He was ambulatory at the scene. There was no entrapment after the fall. There was no drug use involved in the accident. There was no alcohol use involved in the accident. Pertinent negatives include no loss of consciousness. The symptoms are aggravated by activity. He has tried nothing for the symptoms. The treatment provided no relief.    Past Medical History  Diagnosis Date  . Hypertension   . Thyroid nodule   . Anxiety   . GERD (gastroesophageal reflux disease)   . Headache     Past Surgical History  Procedure Laterality Date  . Appendectomy    . Colon surgery  2002    resection  . Arthoscopic rotaor cuff repair Left   . Eye surgery Left     cataract  . Hernia repair      No family history on file.  History  Substance Use Topics  . Smoking status: Current Every Day Smoker -- 1.00 packs/day for 20 years    Types: Cigarettes  . Smokeless tobacco: Not on file  . Alcohol Use: Yes     Comment: occasionally      Review of Systems  Neurological: Negative for loss of consciousness.  All other systems reviewed and are negative.    Allergies  Morphine and Penicillins  Home Medications   Current Outpatient Rx  Name  Route  Sig  Dispense  Refill  . atenolol-chlorthalidone  (TENORETIC) 100-25 MG per tablet   Oral   Take 1 tablet by mouth daily.         Marland Kitchen LORazepam (ATIVAN) 1 MG tablet   Oral   Take 1 mg by mouth 2 (two) times daily between meals as needed for anxiety.         . Multiple Vitamin (MULTIVITAMIN WITH MINERALS) TABS   Oral   Take 1 tablet by mouth daily.         Marland Kitchen omeprazole (PRILOSEC OTC) 20 MG tablet   Oral   Take 40 mg by mouth daily.         Marland Kitchen oxyCODONE-acetaminophen (PERCOCET) 10-325 MG per tablet   Oral   Take 1 tablet by mouth 3 (three) times daily as needed for pain.         . sildenafil (VIAGRA) 100 MG tablet   Oral   Take 100 mg by mouth daily as needed for erectile dysfunction.           BP 129/83  Pulse 89  Temp(Src) 98.3 F (36.8 C) (Oral)  Resp 18  SpO2 94%  Physical Exam  Constitutional: He is oriented to person, place, and time. He appears well-developed and well-nourished.  HENT:  Head: Normocephalic and atraumatic. Head is without raccoon's eyes and without Battle's sign.  Right  Ear: No hemotympanum.  Left Ear: No hemotympanum.  Mouth/Throat: Oropharynx is clear and moist.  Eyes: Conjunctivae are normal. Pupils are equal, round, and reactive to light.  Neck: Normal range of motion. Neck supple.  No midline c spine tenderness  Cardiovascular: Normal rate, regular rhythm and intact distal pulses.   Pulmonary/Chest: Effort normal and breath sounds normal. He has no wheezes. He has no rales.  Abdominal: Soft. Bowel sounds are normal. There is no tenderness. There is no rebound and no guarding.  Musculoskeletal: Normal range of motion. He exhibits no edema.  Neurological: He is alert and oriented to person, place, and time. He has normal reflexes.  5/5 strength in all 4 extremities.  No snuff box tenderness of the wrists rUE neurovascularly intact.  Right hand neurovascularly intact sensation and motor intact to all nerve distributions cap refill < 2 sec  Skin: Skin is warm and dry.  Psychiatric: He  has a normal mood and affect.    ED Course  Procedures (including critical care time)  Labs Reviewed - No data to display Dg Chest 2 View  03/13/2013   *RADIOLOGY REPORT*  Clinical Data: Fall.  Trauma.  Hypertension.  CHEST - 2 VIEW  Comparison: 03/09/2013  Findings: Stable scarring at the lung bases.  Heart size within normal limits for technique.  Atherosclerotic aortic arch noted. Otherwise negative.  IMPRESSION:  1.  Stable bibasilar scarring.   Original Report Authenticated By: Gaylyn Rong, M.D.   Dg Cervical Spine Complete  03/13/2013   *RADIOLOGY REPORT*  Clinical Data: Fall.  Neck pain.  Numbness and weakness in the right side of the body.  CERVICAL SPINE - COMPLETE 4+ VIEW  Comparison: 08/23/2012  Findings: Straightening of the normal cervical lordosis noted.  No malalignment or prevertebral soft tissue swelling noted.  No cervical spine fracture identified.  IMPRESSION: 1.  No fracture or static instability identified. 2.  Straightening of the normal cervical lordosis, possibly from muscle spasm.   Original Report Authenticated By: Gaylyn Rong, M.D.   Dg Shoulder Right  03/13/2013   *RADIOLOGY REPORT*  Clinical Data: Right shoulder pain.  Limited range of motion. Fall.  RIGHT SHOULDER - 2+ VIEW  Comparison: None.  Findings: No fracture or dislocation observed.  No acute bony findings.  IMPRESSION:  1.  No significant abnormality identified.   Original Report Authenticated By: Gaylyn Rong, M.D.   US Biopsy  03/11/2013   *RADIOLOGY REPORT*  Clinical Data:  Patient with history of left thyroid nodules noted on MRI of the cervical spine.  Follow-up thyroid ultrasound on 03/03/2013 revealed a solid nodule in the left lower pole with microcalcifications measuring 1.4 x 1.0 x 1.2 cm.  Request is now made for needle aspirate biopsy of this left lower pole thyroid nodule.  ULTRASOUND GUIDED NEEDLE ASPIRATE BIOPSY ,LEFT LOWER POLE THYROID NODULE  Thyroid biopsy was thoroughly  discussed with the patient and questions were answered.  The benefits, risks, alternatives, and complications were also discussed.  The patient understands and wishes to proceed with the procedure.  Written consent was obtained.  Ultrasound was performed to localize and mark an adequate site for the biopsy.  The patient was then prepped and draped in a normal sterile fashion.  Local anesthesia was provided with 1% lidocaine. Using direct ultrasound guidance, 4 passes were made using 25 gauge needles into the nodule within the left lower lobe of the thyroid. Ultrasound was used to confirm needle placements on all occasions. Specimens were sent to Pathology  for analysis.  Complications:  none  IMPRESSION: Ultrasound guided needle aspirate biopsy performed of aleft lower pole thyroid nodule. Final pathology pending.  Read by: Jeananne Rama, P.A.-C   Original Report Authenticated By: Richarda Overlie, M.D.     No diagnosis found.    MDM  Case d/w Dr. Jordan Likes no NSAIDs prior to surgery   Ice pain medication and follow up with orthopedics for ongoing care    Embree Brawley K Blannie Shedlock-Rasch, MD 03/13/13 [phone]

## 2013-03-13 NOTE — ED Notes (Signed)
Patient presents from home stating he was walking out his driveway and tripped over a fire pit landing on his right shoulder.  Also states that his fingers on his right hand are numb - can feel them when he moves them but otherwise they are just numb.  Scheduled to have neck surgery by Dr Venetia Maxon on Friday for 3 disc in his neck.  States he is feeling worse the pain travels down his neck to his right shoulder and around to the front

## 2013-03-14 NOTE — Progress Notes (Signed)
Anesthesia follow-up:  See note from Grafton Folk, PA-C from 03/10/13.  Shanda Bumps from [facility] called.  Dr. Venetia Maxon has instructed patient to increase potassium rich foods in his diet, and requests that a K+ be repeated on the day of surgery.  An ISTAT4 has already been ordered.  I also reviewed patient's WBC results with Shanda Bumps with plans to repeat CBC as well on the day of surgery.  Velna Ochs [facility]/Anesthesiology Phone [phone] 03/14/2013 3:37 PM

## 2013-03-17 MED ORDER — VANCOMYCIN HCL IN DEXTROSE 1-5 GM/200ML-% IV SOLN
1000.0000 mg | INTRAVENOUS | Status: AC
  Administered 2013-03-18: 1000 mg via INTRAVENOUS
  Filled 2013-03-17: qty 200

## 2013-03-17 NOTE — H&P (Signed)
[facility] [address], [address] [place], [place]  [address] [phone]  Fax [phone]  OUTPATIENT OFFICE NOTE  February 17, 2013  Patient Name: [name] [name]  #[id number]  Date of Birth:  1961-04-25    HOPI:      I spoke with [name] [name] after he cancelled his scheduled surgery and said he was going to go to a chiropractor.  He then brought x-rays of his neck from the chiropractor and asked me to consider these and review them and determine whether or not I still had the same opinion regarding his surgery.  I said that this was essentially meaningless, that he had already had MRIs and a thorough evaluate and I did not know how an AP and lateral plain x-ray from the chiropractor was going to significantly alter my recommendations to him.  I explained that I was extremely frustrated in that we had scheduled the surgery and then he cancelled at the last minute and then called back asking for a refill of Percocet.  I told him at this point that it is fine for him to choose another doctor or another course of therapy but that I am not okay with him continuing pain medicine indefinitely or scheduling surgery and then cancelling it.  I told him at this point he needs to make some decisions.  He is welcome to go to another physician, he is welcome to go to a chiropractor, and he is welcome to go to pain management, but if he wants me to continue to care for him, I am not going to be alright with refilling pain medicine indefinitely while he seeks care elsewhere.  He says he wants to go ahead with surgery and does want to schedule it.  I told him that I would refill the pain medicine up until the surgery and that we will go ahead with the surgery, but if he cancels it again then I will terminate him as a patient.   Danae Orleans. Venetia Maxon, MD         [name]   [phone] DOB:  1961/06/20 01/17/2013:  I met with [name] [name]. I spent approximately 25 minutes in great  detail and discussion with him. We went over his cervical imaging and my recommendations for surgery.  He does wish to proceed. At this point, this will consist of anterior cervical decompression and fusion C3-4, C4-5, and C5-6 levels.  He is working on stopping smoking, but has not been able to stop completely. He is going to be fitted for a Vista cervical collar.  Surgery is scheduled for 02/24/2013.    I have recommended to the patient that he undergo anterior cervical discectomy and fusion with plating.  I went over the diagnostic studies in detail and reviewed surgical models and also discussed the exact nature of the surgical procedure, attendant risks, potential benefits and typical operative and postoperative course.  I discussed the risks of surgery which include, but are not limited to, risks of anesthesia, blood loss, infection, injury to various neck structures including the trachea, esophagus, which could cause temporary or permanent swallowing difficulties and also the potential for perforation of the esophagus which might require operative intervention, larynx, recurrent laryngeal nerve, which could cause either temporary or permanent vocal cord paralysis resulting in either temporary or permanent voice changes, injury to cervical nerve roots, which could cause either temporary or permanent arm pain, numbness and/or weakness.  There is a small chance  of injury to the spinal cord which could cause paralysis.  There is also the potential for malplacement of instrumentation, fusion failure, need for repeat surgery, degenerative disease at other levels in the neck, failure to relieve the pain, worsening of pain.  I also discussed with the patient that he will lose some neck mobility from the surgery.  It is typical to stay in the hospital overnight after this operation.  Typically he will not be able to drive for two weeks after surgery and will come back to see me two weeks following surgery with a  lateral C-spine x-ray and then for monthly visits to three months after surgery.  Generally patients are out of work for four to six weeks following surgery.  He will wear a soft collar for two weeks after surgery.    He continues to have predominantly right upper extremity pain and weakness.          Danae Orleans. Venetia Maxon, M.D./aft     NEUROSURGICAL CONSULTATION     [name] [name]                              DOB:  October 12, 1961      #[id number]                        July 16, 2011   HISTORY:                                                     [name] [name] is a 52 year old man for whom neurosurgical second opinion IME has been requested.  I met with the patient, reviewed his outside records and imaging studies, and then met with [name] [name], his nurse case manager.  [name] [name] is a 52 year old man with neck and left trapezius and left upper arm pain.  He describes his neck pain as ranging from 4 to 8/10 in severity, left arm pain 4 to 8/10 in severity, and headaches when they are severe up to 10/10.  He notes a sharp pain going into his left arm. He says this began in 03/2010.  This occurred after he fell off a truck and subsequently he had two surgeries on his left shoulder since then.  He has had a total of seven cervical injections for neck pain since his first surgery. He has been taking Percocet 10/325 two per day, Flexeril 5 mg. once daily.  His first surgery was by Dr. Simonne Come.  His second surgery was by Dr. Ave Filter. He says he did not do well after the first surgery.  He did considerably better after the second surgery.    His additional medical problems include appendectomy in childhood, circumcision and a section of his colon removed due to polyps.  He has been doing physical therapy for his shoulder which has been limited by his headaches. He feels that his shoulder is doing better, but he is having a significant amount of pain in his left neck to his forearm, and he describes neck pain all  the time.    I reviewed an MRI of his cervical spine which was performed through [facility] on 05/12/2011.  Per my review, this demonstrates a disc herniation laterally at the level of C5-6 which does appear to indent the  lateral aspect of the canal and to my review does cause some foraminal stenosis; however, this was also reviewed as a tiny disc herniation, but I believe that it is focally consistent with a C6 radiculopathy and focal disc herniation which may well be causing a significant C6 nerve root compression and C6 radiculopathy.    I also reviewed physical therapy notes and I reviewed Dr. Veda Canning evaluation, and Dr. Ollen Gross notes.  I also reviewed films from his arthroscopic shoulder surgery and also the films with the removed shoulder anchors.    I have also reviewed Mr. Kolasinski's notes from his lawyer at [employer], [place].  This is a summary of his previous treatments.   REVIEW OF SYSTEMS:                              A detailed Review of Systems sheet was reviewed with the patient.  Pertinent positives include wears glasses, eye injuries, high blood pressure, kidney stones, neck pain, and anxiety.  All other systems are negative; this includes Constitutional symptoms, Ears, nose, mouth, throat, Endocrine, Respiratory, Gastrointestinal, Integumentary & Breast, Neurologic, Hematologic/Lymphatic, Allergic/Immunologic.    PAST MEDICAL HISTORY:                                  Current Medical Conditions:                    As described above            Prior Operations and Hospitalizations:     As described above.             Medications and Allergies:                       Medications - Atenolol 100/25 qd, Oxycodone 10/325, Centrum Silver, Lorazepam 1 mg., and Flexeril 5 mg. qd.  ALLERGIES ARE TO MORPHINE.            Height and Weight:                                  He is currently 5'11" tall, 196 lbs.    FAMILY HISTORY:                                      His mother is 55 in good health. His father's health history not specified.    SOCIAL HISTORY:                                     He is a smoker and continues to smoke one pack per day.  He denies any history of drug abuse and is a social drinker of alcoholic beverages.   PHYSICAL EXAMINATION:                                General Appearance:                                 On examination today, Mr.  [name] is a pleasant and cooperative, somewhat anxious appearing man in no acute distress.             Blood Pressure, Pulse:                              His blood pressure is 142/90.  Heart rate is 74. Respiratory rate is 16.             HEENT - normocephalic, atraumatic.  The pupils are equal, round and reactive to light.  The extraocular muscles are intact.  Sclerae - white.  Conjunctiva - pink.  Oropharynx benign.  Uvula midline.            Neck - there are no masses, meningismus, deformities, tracheal deviation, jugular vein distention or carotid bruits.  There is normal cervical range of motion.  Positive Spurlings' to the left, negative to the right.  Negative Lhermitte's sign with axial compression.  He has some left parascapular discomfort.             Respiratory - there is normal respiratory effort with good intercostal function.  Lungs are clear to auscultation.  There are no rales, rhonchi or wheezes.             Cardiovascular - the heart has regular rate and rhythm to auscultation.  No murmurs are appreciated.  There is no extremity edema, cyanosis or clubbing.  There are palpable pedal pulses.             Abdomen - soft, nontender, no hepatosplenomegaly appreciated or masses.  There are active bowel sounds.  No guarding or rebound.             Musculoskeletal Examination - the patient is able to walk about the examining room with a normal, casual, heel and toe gait.  There are a mildly positive Tinel's sign at the right wrist and negative Phalen's signs bilaterally.     NEUROLOGICAL EXAMINATION:        The patient is oriented to time, person and place and has good recall of both recent and remote memory with normal attention span and concentration.  The patient speaks with clear and fluent speech and exhibits normal language function and appropriate fund of knowledge.             Cranial Nerve Examination - pupils are equal, round and reactive to light.  Extraocular movements are full.  Visual fields are full to confrontational testing.  Facial sensation and facial movement are symmetric and intact.  Hearing is intact to finger rub.  Palate is upgoing.  Shoulder shrug is symmetric.  Tongue protrudes in the midline.             Motor Examination - motor strength is 5/5 in the bilateral deltoids, triceps, handgrips,  interosseous, 5/5 right biceps, left biceps strength at 4/5, 5/5 right wrist extension, and left wrist extension strength at 4/5.  In the lower extremities motor strength is 5/5 in hip flexion, extension, quadriceps, hamstrings, plantar flexion, dorsiflexion and extensor hallucis longus.             Sensory Examination - normal to light touch and pinprick sensation in the upper and lower extremities.            Deep Tendon Reflexes - 2 in the biceps, triceps, and brachioradialis, 2 in the knees, 2 in the ankles.  The great toes are downgoing to plantar stimulation.  Cerebellar Examination - normal coordination in upper and lower extremities and normal rapid alternating movements.  Romberg test is negative.    IMPRESSION AND RECOMMENDATIONS:      My impression is that [name] [name] actually does have a symptomatic cervical radiculopathy due to the disc herniation at the C5-6 level.  He  has some mild disc bulges at C4-5 and C3-4 levels without severe foraminal stenosis or nerve root compression.    In light of his physical exam findings and the fact that he has undergone protracted therapy without much relief, I think it is reasonable to  proceed with surgical intervention and I have suggested that he undergo a C5-6 ProDisc-C arthroplasty. I think this is less likely to be problematic for him from the standpoint of adjacent segment disease as well as given the fact that he is a one pack per day smoker, he is less likely to have a nonunion with a fusion as there is no requirement for smoking cessation or fusion for a successful outcome from surgery. I also advised him the importance of stopping smoking.    I gave [name] [name] literature on the disc arthroplasty as well as standard fusion operations. I reviewed his imaging studies with and with his nurse case manager. I was asked to comment on the causation of his cervical disc herniation and I think this is entirely consistent with an injury in which he fell off a truck and injured his shoulder, and it is certainly consistent with that kind of mechanism that he would have developed a cervical radiculopathy and cervical disc herniation in conjunction with that injury. He now feels that his shoulder is better, but he is still having a lot of neck and arm discomfort. His biceps strength does seem a little bit problematic given his prior shoulder surgery, but he does also have wrist extensor weakness and this would not be present if he did not have a cervical radiculopathy.    At this point I have made my surgical recommendations and commented on causation, and the patient wants to consider his options and will let me know if he wants further help from me in the future.    [facility]       Danae Orleans. Venetia Maxon, M.D.

## 2013-03-18 ENCOUNTER — Encounter (HOSPITAL_COMMUNITY): Payer: Self-pay | Admitting: *Deleted

## 2013-03-18 ENCOUNTER — Observation Stay (HOSPITAL_COMMUNITY)
Admission: RE | Admit: 2013-03-18 | Discharge: 2013-03-19 | Disposition: A | Discharge status: Home / Self Care | Payer: Medicaid Other | Source: Ambulatory Visit | Attending: Neurosurgery | Admitting: Neurosurgery

## 2013-03-18 ENCOUNTER — Inpatient Hospital Stay (HOSPITAL_COMMUNITY): Payer: Medicaid Other | Admitting: Anesthesiology

## 2013-03-18 ENCOUNTER — Inpatient Hospital Stay (HOSPITAL_COMMUNITY): Payer: Medicaid Other

## 2013-03-18 ENCOUNTER — Encounter (HOSPITAL_COMMUNITY): Payer: Self-pay | Admitting: Anesthesiology

## 2013-03-18 ENCOUNTER — Encounter (HOSPITAL_COMMUNITY)
Admission: RE | Disposition: A | Discharge status: Home / Self Care | Payer: Self-pay | Source: Ambulatory Visit | Attending: Neurosurgery

## 2013-03-18 DIAGNOSIS — Z981 Arthrodesis status: Secondary | ICD-10-CM

## 2013-03-18 DIAGNOSIS — I10 Essential (primary) hypertension: Secondary | ICD-10-CM | POA: Insufficient documentation

## 2013-03-18 DIAGNOSIS — M5020 Other cervical disc displacement, unspecified cervical region: Secondary | ICD-10-CM | POA: Insufficient documentation

## 2013-03-18 DIAGNOSIS — Z01818 Encounter for other preprocedural examination: Secondary | ICD-10-CM | POA: Insufficient documentation

## 2013-03-18 DIAGNOSIS — Z01812 Encounter for preprocedural laboratory examination: Secondary | ICD-10-CM | POA: Insufficient documentation

## 2013-03-18 DIAGNOSIS — M47812 Spondylosis without myelopathy or radiculopathy, cervical region: Principal | ICD-10-CM | POA: Insufficient documentation

## 2013-03-18 DIAGNOSIS — Z01810 Encounter for preprocedural cardiovascular examination: Secondary | ICD-10-CM | POA: Insufficient documentation

## 2013-03-18 HISTORY — PX: ANTERIOR CERVICAL DECOMP/DISCECTOMY FUSION: SHX1161

## 2013-03-18 LAB — POCT I-STAT 4, (NA,K, GLUC, HGB,HCT)
Glucose, Bld: 97 mg/dL (ref 70–99)
HCT: 42 % (ref 39.0–52.0)
Hemoglobin: 14.3 g/dL (ref 13.0–17.0)
Sodium: 139 mEq/L (ref 135–145)

## 2013-03-18 LAB — CBC
HCT: 38.5 % — ABNORMAL LOW (ref 39.0–52.0)
MCV: 81.2 fL (ref 78.0–100.0)
Platelets: 359 10*3/uL (ref 150–400)
RBC: 4.74 MIL/uL (ref 4.22–5.81)
WBC: 12.8 10*3/uL — ABNORMAL HIGH (ref 4.0–10.5)

## 2013-03-18 LAB — DIFFERENTIAL
Eosinophils Relative: 3 % (ref 0–5)
Lymphocytes Relative: 33 % (ref 12–46)
Lymphs Abs: 4.3 10*3/uL — ABNORMAL HIGH (ref 0.7–4.0)
Monocytes Absolute: 0.9 10*3/uL (ref 0.1–1.0)
Neutro Abs: 7.3 10*3/uL (ref 1.7–7.7)

## 2013-03-18 SURGERY — ANTERIOR CERVICAL DECOMPRESSION/DISCECTOMY FUSION 3 LEVELS
Anesthesia: General | Site: Neck | Wound class: Clean

## 2013-03-18 MED ORDER — PROPOFOL 10 MG/ML IV BOLUS
INTRAVENOUS | Status: DC | PRN
  Administered 2013-03-18: 200 mg via INTRAVENOUS

## 2013-03-18 MED ORDER — PANTOPRAZOLE SODIUM 40 MG IV SOLR: 40.0000 mg | Freq: Every day | INTRAVENOUS | Status: DC

## 2013-03-18 MED ORDER — GLYCOPYRROLATE 0.2 MG/ML IJ SOLN
INTRAMUSCULAR | Status: DC | PRN
  Administered 2013-03-18: 0.6 mg via INTRAVENOUS

## 2013-03-18 MED ORDER — FENTANYL CITRATE 0.05 MG/ML IJ SOLN
INTRAMUSCULAR | Status: DC | PRN
  Administered 2013-03-18: 50 ug via INTRAVENOUS
  Administered 2013-03-18: 100 ug via INTRAVENOUS
  Administered 2013-03-18 (×3): 50 ug via INTRAVENOUS
  Administered 2013-03-18 (×2): 100 ug via INTRAVENOUS

## 2013-03-18 MED ORDER — FLEET ENEMA 7-19 GM/118ML RE ENEM
1.0000 | ENEMA | Freq: Once | RECTAL | Status: AC | PRN
  Filled 2013-03-18: qty 1

## 2013-03-18 MED ORDER — DOCUSATE SODIUM 100 MG PO CAPS
100.0000 mg | ORAL_CAPSULE | Freq: Two times a day (BID) | ORAL | Status: DC
  Administered 2013-03-18 – 2013-03-19 (×2): 100 mg via ORAL
  Filled 2013-03-18 (×2): qty 1

## 2013-03-18 MED ORDER — LACTATED RINGERS IV SOLN
INTRAVENOUS | Status: DC | PRN
  Administered 2013-03-18 (×2): via INTRAVENOUS

## 2013-03-18 MED ORDER — LIDOCAINE HCL (CARDIAC) 20 MG/ML IV SOLN
INTRAVENOUS | Status: DC | PRN
  Administered 2013-03-18: 60 mg via INTRAVENOUS

## 2013-03-18 MED ORDER — ATENOLOL-CHLORTHALIDONE 100-25 MG PO TABS: 1.0000 | ORAL_TABLET | Freq: Every day | ORAL | Status: DC

## 2013-03-18 MED ORDER — 0.9 % SODIUM CHLORIDE (POUR BTL) OPTIME
TOPICAL | Status: DC | PRN
  Administered 2013-03-18: 1000 mL

## 2013-03-18 MED ORDER — LIDOCAINE HCL 4 % MT SOLN
OROMUCOSAL | Status: DC | PRN
  Administered 2013-03-18: 4 mL via TOPICAL

## 2013-03-18 MED ORDER — ARTIFICIAL TEARS OP OINT
TOPICAL_OINTMENT | OPHTHALMIC | Status: DC | PRN
  Administered 2013-03-18: 1 via OPHTHALMIC

## 2013-03-18 MED ORDER — CHLORTHALIDONE 25 MG PO TABS
25.0000 mg | ORAL_TABLET | Freq: Every day | ORAL | Status: DC
Start: 2013-03-19 — End: 2013-03-19
  Filled 2013-03-18: qty 1

## 2013-03-18 MED ORDER — SODIUM CHLORIDE 0.9 % IV SOLN: 250.0000 mL | INTRAVENOUS | Status: DC

## 2013-03-18 MED ORDER — ZOLPIDEM TARTRATE 5 MG PO TABS: 5.0000 mg | ORAL_TABLET | Freq: Every evening | ORAL | Status: DC | PRN

## 2013-03-18 MED ORDER — ACETAMINOPHEN 650 MG RE SUPP: 650.0000 mg | RECTAL | Status: DC | PRN

## 2013-03-18 MED ORDER — OXYCODONE HCL 5 MG PO TABS: 5.0000 mg | ORAL_TABLET | ORAL | Status: DC | PRN

## 2013-03-18 MED ORDER — ADULT MULTIVITAMIN W/MINERALS CH
1.0000 | ORAL_TABLET | Freq: Every day | ORAL | Status: DC
  Filled 2013-03-18 (×2): qty 1

## 2013-03-18 MED ORDER — LIDOCAINE-EPINEPHRINE 1 %-1:100000 IJ SOLN
INTRAMUSCULAR | Status: DC | PRN
  Administered 2013-03-18: 10 mL

## 2013-03-18 MED ORDER — NEOSTIGMINE METHYLSULFATE 1 MG/ML IJ SOLN
INTRAMUSCULAR | Status: DC | PRN
  Administered 2013-03-18: 4 mg via INTRAVENOUS

## 2013-03-18 MED ORDER — MENTHOL 3 MG MT LOZG
1.0000 | LOZENGE | OROMUCOSAL | Status: DC | PRN
  Filled 2013-03-18: qty 9

## 2013-03-18 MED ORDER — HYDROMORPHONE HCL PF 1 MG/ML IJ SOLN
INTRAMUSCULAR | Status: DC | PRN
  Administered 2013-03-18 (×2): 0.5 mg via INTRAVENOUS

## 2013-03-18 MED ORDER — SENNOSIDES-DOCUSATE SODIUM 8.6-50 MG PO TABS
1.0000 | ORAL_TABLET | Freq: Every evening | ORAL | Status: DC | PRN
  Filled 2013-03-18: qty 1

## 2013-03-18 MED ORDER — ONDANSETRON HCL 4 MG/2ML IJ SOLN: 4.0000 mg | Freq: Four times a day (QID) | INTRAMUSCULAR | Status: DC | PRN

## 2013-03-18 MED ORDER — OXYCODONE-ACETAMINOPHEN 10-325 MG PO TABS: 1.0000 | ORAL_TABLET | ORAL | Status: DC | PRN

## 2013-03-18 MED ORDER — HYDROCODONE-ACETAMINOPHEN 5-325 MG PO TABS: 1.0000 | ORAL_TABLET | ORAL | Status: DC | PRN

## 2013-03-18 MED ORDER — OXYCODONE HCL 5 MG PO TABS
5.0000 mg | ORAL_TABLET | Freq: Once | ORAL | Status: AC | PRN
  Administered 2013-03-18: 5 mg via ORAL

## 2013-03-18 MED ORDER — ACETAMINOPHEN 10 MG/ML IV SOLN
INTRAVENOUS | Status: AC
  Administered 2013-03-18: 1000 mg via INTRAVENOUS
  Filled 2013-03-18: qty 100

## 2013-03-18 MED ORDER — SODIUM CHLORIDE 0.9 % IJ SOLN: 3.0000 mL | INTRAMUSCULAR | Status: DC | PRN

## 2013-03-18 MED ORDER — SODIUM CHLORIDE 0.9 % IJ SOLN: 3.0000 mL | Freq: Two times a day (BID) | INTRAMUSCULAR | Status: DC

## 2013-03-18 MED ORDER — HEMOSTATIC AGENTS (NO CHARGE) OPTIME: TOPICAL | Status: DC | PRN

## 2013-03-18 MED ORDER — ACETAMINOPHEN 325 MG PO TABS: 650.0000 mg | ORAL_TABLET | ORAL | Status: DC | PRN

## 2013-03-18 MED ORDER — OXYCODONE-ACETAMINOPHEN 5-325 MG PO TABS: 1.0000 | ORAL_TABLET | ORAL | Status: DC | PRN

## 2013-03-18 MED ORDER — HYDROMORPHONE HCL PF 1 MG/ML IJ SOLN
0.2500 mg | INTRAMUSCULAR | Status: DC | PRN
  Administered 2013-03-18 (×4): 0.5 mg via INTRAVENOUS

## 2013-03-18 MED ORDER — HYDROMORPHONE HCL PF 1 MG/ML IJ SOLN
INTRAMUSCULAR | Status: AC
  Filled 2013-03-18: qty 1

## 2013-03-18 MED ORDER — OXYCODONE HCL 5 MG PO TABS
ORAL_TABLET | ORAL | Status: AC
  Filled 2013-03-18: qty 1

## 2013-03-18 MED ORDER — ROCURONIUM BROMIDE 100 MG/10ML IV SOLN
INTRAVENOUS | Status: DC | PRN
  Administered 2013-03-18: 20 mg via INTRAVENOUS
  Administered 2013-03-18: 50 mg via INTRAVENOUS
  Administered 2013-03-18: 30 mg via INTRAVENOUS

## 2013-03-18 MED ORDER — DIAZEPAM 5 MG PO TABS
5.0000 mg | ORAL_TABLET | Freq: Four times a day (QID) | ORAL | Status: DC | PRN
  Administered 2013-03-18 – 2013-03-19 (×3): 5 mg via ORAL
  Filled 2013-03-18 (×3): qty 1

## 2013-03-18 MED ORDER — ONDANSETRON HCL 4 MG/2ML IJ SOLN: 4.0000 mg | INTRAMUSCULAR | Status: DC | PRN

## 2013-03-18 MED ORDER — KCL IN DEXTROSE-NACL 20-5-0.45 MEQ/L-%-% IV SOLN
INTRAVENOUS | Status: DC
  Administered 2013-03-18: 22:00:00 via INTRAVENOUS
  Filled 2013-03-18 (×2): qty 1000

## 2013-03-18 MED ORDER — OXYCODONE-ACETAMINOPHEN 5-325 MG PO TABS
1.0000 | ORAL_TABLET | ORAL | Status: DC | PRN
  Administered 2013-03-18: 1 via ORAL
  Administered 2013-03-19 (×2): 2 via ORAL
  Filled 2013-03-18 (×3): qty 2

## 2013-03-18 MED ORDER — BUPIVACAINE HCL (PF) 0.5 % IJ SOLN
INTRAMUSCULAR | Status: DC | PRN
  Administered 2013-03-18: 10 mL

## 2013-03-18 MED ORDER — SURGIFOAM 100 EX MISC
CUTANEOUS | Status: DC | PRN
  Administered 2013-03-18 (×2): via TOPICAL

## 2013-03-18 MED ORDER — PANTOPRAZOLE SODIUM 40 MG PO TBEC
80.0000 mg | DELAYED_RELEASE_TABLET | Freq: Every day | ORAL | Status: DC
  Filled 2013-03-18: qty 2

## 2013-03-18 MED ORDER — OMEPRAZOLE MAGNESIUM 20 MG PO TBEC: 40.0000 mg | DELAYED_RELEASE_TABLET | Freq: Every day | ORAL | Status: DC

## 2013-03-18 MED ORDER — ATENOLOL 100 MG PO TABS
100.0000 mg | ORAL_TABLET | Freq: Every day | ORAL | Status: DC
  Filled 2013-03-18: qty 1

## 2013-03-18 MED ORDER — OXYCODONE HCL 5 MG/5ML PO SOLN: 5.0000 mg | Freq: Once | ORAL | Status: AC | PRN

## 2013-03-18 MED ORDER — SENNA 8.6 MG PO TABS
1.0000 | ORAL_TABLET | Freq: Two times a day (BID) | ORAL | Status: DC
  Administered 2013-03-18: 8.6 mg via ORAL
  Filled 2013-03-18 (×3): qty 1

## 2013-03-18 MED ORDER — BISACODYL 10 MG RE SUPP: 10.0000 mg | Freq: Every day | RECTAL | Status: DC | PRN

## 2013-03-18 MED ORDER — ONDANSETRON HCL 4 MG/2ML IJ SOLN
INTRAMUSCULAR | Status: DC | PRN
  Administered 2013-03-18: 4 mg via INTRAVENOUS

## 2013-03-18 MED ORDER — DEXAMETHASONE SODIUM PHOSPHATE 4 MG/ML IJ SOLN
INTRAMUSCULAR | Status: DC | PRN
  Administered 2013-03-18: 10 mg via INTRAVENOUS

## 2013-03-18 MED ORDER — PHENOL 1.4 % MT LIQD: 1.0000 | OROMUCOSAL | Status: DC | PRN

## 2013-03-18 MED ORDER — LORAZEPAM 0.5 MG PO TABS: 1.0000 mg | ORAL_TABLET | Freq: Two times a day (BID) | ORAL | Status: DC | PRN

## 2013-03-18 MED ORDER — HYDROMORPHONE HCL PF 1 MG/ML IJ SOLN
0.5000 mg | INTRAMUSCULAR | Status: DC | PRN
  Administered 2013-03-18 – 2013-03-19 (×2): 1 mg via INTRAVENOUS
  Filled 2013-03-18 (×2): qty 1

## 2013-03-18 MED ORDER — METHOCARBAMOL 750 MG PO TABS
750.0000 mg | ORAL_TABLET | Freq: Two times a day (BID) | ORAL | Status: DC
  Administered 2013-03-18: 750 mg via ORAL
  Filled 2013-03-18 (×3): qty 1

## 2013-03-18 MED ORDER — MIDAZOLAM HCL 5 MG/5ML IJ SOLN
INTRAMUSCULAR | Status: DC | PRN
  Administered 2013-03-18: 2 mg via INTRAVENOUS

## 2013-03-18 SURGICAL SUPPLY — 71 items
BAG DECANTER FOR FLEXI CONT (MISCELLANEOUS) ×2 IMPLANT
BANDAGE GAUZE ELAST BULKY 4 IN (GAUZE/BANDAGES/DRESSINGS) IMPLANT
BENZOIN TINCTURE PRP APPL 2/3 (GAUZE/BANDAGES/DRESSINGS) IMPLANT
BIT DRILL 14MM (INSTRUMENTS) ×1 IMPLANT
BIT DRILL NEURO 2X3.1 SFT TUCH (MISCELLANEOUS) ×1 IMPLANT
BLADE ULTRA TIP 2M (BLADE) ×2 IMPLANT
BUR BARREL STRAIGHT FLUTE 4.0 (BURR) ×2 IMPLANT
CANISTER SUCTION 2500CC (MISCELLANEOUS) ×2 IMPLANT
CLOTH BEACON ORANGE TIMEOUT ST (SAFETY) ×2 IMPLANT
CONT SPEC 4OZ CLIKSEAL STRL BL (MISCELLANEOUS) ×2 IMPLANT
COVER MAYO STAND STRL (DRAPES) ×2 IMPLANT
DERMABOND ADVANCED (GAUZE/BANDAGES/DRESSINGS) ×1
DERMABOND ADVANCED .7 DNX12 (GAUZE/BANDAGES/DRESSINGS) ×1 IMPLANT
DRAIN JACKSON PRATT 10MM FLAT (MISCELLANEOUS) ×2 IMPLANT
DRAPE LAPAROTOMY 100X72 PEDS (DRAPES) ×2 IMPLANT
DRAPE MICROSCOPE LEICA (MISCELLANEOUS) IMPLANT
DRAPE POUCH INSTRU U-SHP 10X18 (DRAPES) ×2 IMPLANT
DRAPE PROXIMA HALF (DRAPES) IMPLANT
DRESSING TELFA 8X3 (GAUZE/BANDAGES/DRESSINGS) IMPLANT
DRILL 14MM (INSTRUMENTS) ×2
DRILL NEURO 2X3.1 SOFT TOUCH (MISCELLANEOUS) ×2
DURAPREP 6ML APPLICATOR 50/CS (WOUND CARE) ×2 IMPLANT
ELECT COATED BLADE 2.86 ST (ELECTRODE) ×2 IMPLANT
ELECT REM PT RETURN 9FT ADLT (ELECTROSURGICAL) ×2
ELECTRODE REM PT RTRN 9FT ADLT (ELECTROSURGICAL) ×1 IMPLANT
EVACUATOR SILICONE 100CC (DRAIN) ×2 IMPLANT
GAUZE SPONGE 4X4 16PLY XRAY LF (GAUZE/BANDAGES/DRESSINGS) IMPLANT
GLOVE BIO SURGEON STRL SZ 6.5 (GLOVE) ×4 IMPLANT
GLOVE BIO SURGEON STRL SZ8 (GLOVE) ×4 IMPLANT
GLOVE BIOGEL PI IND STRL 6.5 (GLOVE) ×1 IMPLANT
GLOVE BIOGEL PI IND STRL 8 (GLOVE) ×3 IMPLANT
GLOVE BIOGEL PI IND STRL 8.5 (GLOVE) ×1 IMPLANT
GLOVE BIOGEL PI INDICATOR 6.5 (GLOVE) ×1
GLOVE BIOGEL PI INDICATOR 8 (GLOVE) ×3
GLOVE BIOGEL PI INDICATOR 8.5 (GLOVE) ×1
GLOVE ECLIPSE 7.5 STRL STRAW (GLOVE) ×6 IMPLANT
GLOVE ECLIPSE 8.0 STRL XLNG CF (GLOVE) ×2 IMPLANT
GLOVE EXAM NITRILE LRG STRL (GLOVE) IMPLANT
GLOVE EXAM NITRILE MD LF STRL (GLOVE) ×2 IMPLANT
GLOVE EXAM NITRILE XL STR (GLOVE) IMPLANT
GLOVE EXAM NITRILE XS STR PU (GLOVE) IMPLANT
GOWN BRE IMP SLV AUR LG STRL (GOWN DISPOSABLE) IMPLANT
GOWN BRE IMP SLV AUR XL STRL (GOWN DISPOSABLE) IMPLANT
GOWN STRL REIN 2XL LVL4 (GOWN DISPOSABLE) IMPLANT
HEAD HALTER (SOFTGOODS) ×2 IMPLANT
KIT BASIN OR (CUSTOM PROCEDURE TRAY) ×2 IMPLANT
KIT ROOM TURNOVER OR (KITS) ×2 IMPLANT
NEEDLE HYPO 25X1 1.5 SAFETY (NEEDLE) ×2 IMPLANT
NEEDLE SPNL 18GX3.5 QUINCKE PK (NEEDLE) ×2 IMPLANT
NEEDLE SPNL 22GX3.5 QUINCKE BK (NEEDLE) ×2 IMPLANT
NS IRRIG 1000ML POUR BTL (IV SOLUTION) ×2 IMPLANT
PACK LAMINECTOMY NEURO (CUSTOM PROCEDURE TRAY) ×2 IMPLANT
PAD ARMBOARD 7.5X6 YLW CONV (MISCELLANEOUS) ×6 IMPLANT
PIN DISTRACTION 14MM (PIN) ×4 IMPLANT
PLATE 54MM (Plate) ×2 IMPLANT
RUBBERBAND STERILE (MISCELLANEOUS) ×4 IMPLANT
SCREW 14MM (Screw) ×16 IMPLANT
SPACER ASSEM CERV LORD 7M (Spacer) ×6 IMPLANT
SPONGE GAUZE 4X4 12PLY (GAUZE/BANDAGES/DRESSINGS) IMPLANT
SPONGE INTESTINAL PEANUT (DISPOSABLE) ×4 IMPLANT
SPONGE SURGIFOAM ABS GEL 100 (HEMOSTASIS) ×2 IMPLANT
STAPLER SKIN PROX WIDE 3.9 (STAPLE) IMPLANT
STRIP CLOSURE SKIN 1/2X4 (GAUZE/BANDAGES/DRESSINGS) IMPLANT
SUT VIC AB 3-0 SH 8-18 (SUTURE) ×2 IMPLANT
SYR 20ML ECCENTRIC (SYRINGE) ×2 IMPLANT
SYR 5ML LL (SYRINGE) IMPLANT
TOWEL OR 17X24 6PK STRL BLUE (TOWEL DISPOSABLE) ×2 IMPLANT
TOWEL OR 17X26 10 PK STRL BLUE (TOWEL DISPOSABLE) ×2 IMPLANT
TRAP SPECIMEN MUCOUS 40CC (MISCELLANEOUS) ×2 IMPLANT
TRAY FOLEY CATH 14FRSI W/METER (CATHETERS) IMPLANT
WATER STERILE IRR 1000ML POUR (IV SOLUTION) ×2 IMPLANT

## 2013-03-18 NOTE — Brief Op Note (Signed)
03/18/2013  6:01 PM  PATIENT:  [name] [name]  52 y.o. 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  PROCEDURE:  Procedure(s) with comments: ANTERIOR CERVICAL DECOMPRESSION/DISCECTOMY FUSION 3 LEVELS (N/A) - Anterior Cervical Three-Six  Decompression/Diskectomy/Fusion, autograft, allograft, plate  SURGEON:  Surgeon(s) and Role:    * Maeola Harman, MD - Primary    * Tia Alert, MD - Assisting  PHYSICIAN ASSISTANT:   ASSISTANTS: Poteat, RN   ANESTHESIA:   general  EBL:  Total I/O In: 1600 [I.V.:1600] Out: 20 [Blood:20]  BLOOD ADMINISTERED:none  DRAINS: (10) Jackson-Pratt drain(s) with closed bulb suction in the prevertebral space   LOCAL MEDICATIONS USED:  MARCAINE     SPECIMEN:  No Specimen  DISPOSITION OF SPECIMEN:  N/A  COUNTS:  YES  TOURNIQUET:  * No tourniquets in log *  DICTATION: Patient was brought to operating room and following the smooth and uncomplicated induction of general endotracheal anesthesia his head was placed on a horseshoe head holder he was placed in 5 pounds of Holter traction and his anterior neck was prepped and draped in usual sterile fashion. An incision was made on the left side of midline after infiltrating the skin and subcutaneous tissues with local lidocaine. The platysmal layer was incised and subplatysmal dissection was performed exposing the anterior border sternocleidomastoid muscle. Using blunt dissection the carotid sheath was kept lateral and trachea and esophagus kept medial exposing the anterior cervical spine. A bent spinal needle was placed it was felt to be the C 34  level and this was confirmed on intraoperative x-ray. Longus coli muscles were taken down from the anterior cervical spine using electrocautery and key  elevator and self-retaining retractor was placed. The interspace at C 56 was incised and a thorough discectomy was performed. Distraction pins were placed. Uncinate spurs and central spondylitic ridges were drilled down with a high-speed drill. The spinal cord dura and both C6 nerve roots were widely decompressed. Hemostasis was assured. After trial sizing a 7 mm machined lordotic allograft packed with morsellized autograft was selected and  tamped into position and countersunk appropriately. The retractor was moved and the interspace at C 45 was incised and a thorough discectomy was performed. Distraction pins were placed. Uncinate spurs and central spondylitic ridges were drilled down with a high-speed drill. The spinal cord dura and both C5 nerve roots were widely decompressed. Hemostasis was assured. After trial sizing a similarly sized bone graft was selected and packed with autograft. The graft was tamped into position and countersunk appropriately.The interspace at C 34 was incised and a thorough discectomy was performed. Distraction pins were placed. Uncinate spurs and central spondylitic ridges were drilled down with a high-speed drill. The spinal cord dura and both C 4 nerve roots were widely decompressed.  Hemostasis was assured. After trial sizing a similarly sized graft was selected and packed with autograft. The graft was tamped into position and countersunk appropriately.  Distraction weight was removed. A 54 mm trestle luxe anterior cervical plate was affixed to the cervical spine with 14 mm variable-angle screws 2 at C3, 2 at C4, 2 at C5,  and 2 at C6.  All screws were well-positioned and locking mechanisms were engaged. Soft tissues were inspected and found to be in good repair. The wound was irrigated. A final x-ray was obtained with  good visualization to C 5 with the interbody grafts well visualized. A 10 JP drain was placed and anchored with a 3-0 stitch. The platysma layer was closed with 3-0  Vicryl stitches and the skin was reapproximated with 3-0 Vicryl subcuticular stitches. The wound was dressed with Dermabond. Counts were correct at the end of the case. Patient was extubated and taken to recovery in stable and satisfactory condition.   PLAN OF CARE: Admit for overnight observation  PATIENT DISPOSITION:  PACU - hemodynamically stable.   Delay start of Pharmacological VTE agent (>24hrs) due to surgical blood loss or risk of bleeding: yes

## 2013-03-18 NOTE — Op Note (Signed)
03/18/2013  6:01 PM  PATIENT:  [name] [name]  51 y.o. 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  PROCEDURE:  Procedure(s) with comments: ANTERIOR CERVICAL DECOMPRESSION/DISCECTOMY FUSION 3 LEVELS (N/A) - Anterior Cervical Three-Six  Decompression/Diskectomy/Fusion, autograft, allograft, plate  SURGEON:  Surgeon(s) and Role:    * Siena Poehler, MD - Primary    * David S Jones, MD - Assisting  PHYSICIAN ASSISTANT:   ASSISTANTS: Poteat, RN   ANESTHESIA:   general  EBL:  Total I/O In: 1600 [I.V.:1600] Out: 20 [Blood:20]  BLOOD ADMINISTERED:none  DRAINS: (10) Jackson-Pratt drain(s) with closed bulb suction in the prevertebral space   LOCAL MEDICATIONS USED:  MARCAINE     SPECIMEN:  No Specimen  DISPOSITION OF SPECIMEN:  N/A  COUNTS:  YES  TOURNIQUET:  * No tourniquets in log *  DICTATION: Patient was brought to operating room and following the smooth and uncomplicated induction of general endotracheal anesthesia his head was placed on a horseshoe head holder he was placed in 5 pounds of Holter traction and his anterior neck was prepped and draped in usual sterile fashion. An incision was made on the left side of midline after infiltrating the skin and subcutaneous tissues with local lidocaine. The platysmal layer was incised and subplatysmal dissection was performed exposing the anterior border sternocleidomastoid muscle. Using blunt dissection the carotid sheath was kept lateral and trachea and esophagus kept medial exposing the anterior cervical spine. A bent spinal needle was placed it was felt to be the C 34  level and this was confirmed on intraoperative x-ray. Longus coli muscles were taken down from the anterior cervical spine using electrocautery and key  elevator and self-retaining retractor was placed. The interspace at C 56 was incised and a thorough discectomy was performed. Distraction pins were placed. Uncinate spurs and central spondylitic ridges were drilled down with a high-speed drill. The spinal cord dura and both C6 nerve roots were widely decompressed. Hemostasis was assured. After trial sizing a 7 mm machined lordotic allograft packed with morsellized autograft was selected and  tamped into position and countersunk appropriately. The retractor was moved and the interspace at C 45 was incised and a thorough discectomy was performed. Distraction pins were placed. Uncinate spurs and central spondylitic ridges were drilled down with a high-speed drill. The spinal cord dura and both C5 nerve roots were widely decompressed. Hemostasis was assured. After trial sizing a similarly sized bone graft was selected and packed with autograft. The graft was tamped into position and countersunk appropriately.The interspace at C 34 was incised and a thorough discectomy was performed. Distraction pins were placed. Uncinate spurs and central spondylitic ridges were drilled down with a high-speed drill. The spinal cord dura and both C 4 nerve roots were widely decompressed.  Hemostasis was assured. After trial sizing a similarly sized graft was selected and packed with autograft. The graft was tamped into position and countersunk appropriately.  Distraction weight was removed. A 54 mm trestle luxe anterior cervical plate was affixed to the cervical spine with 14 mm variable-angle screws 2 at C3, 2 at C4, 2 at C5,  and 2 at C6.  All screws were well-positioned and locking mechanisms were engaged. Soft tissues were inspected and found to be in good repair. The wound was irrigated. A final x-ray was obtained with   good visualization to C 5 with the interbody grafts well visualized. A 10 JP drain was placed and anchored with a 3-0 stitch. The platysma layer was closed with 3-0  Vicryl stitches and the skin was reapproximated with 3-0 Vicryl subcuticular stitches. The wound was dressed with Dermabond. Counts were correct at the end of the case. Patient was extubated and taken to recovery in stable and satisfactory condition.   PLAN OF CARE: Admit for overnight observation  PATIENT DISPOSITION:  PACU - hemodynamically stable.   Delay start of Pharmacological VTE agent (>24hrs) due to surgical blood loss or risk of bleeding: yes  

## 2013-03-18 NOTE — Anesthesia Preprocedure Evaluation (Signed)
Anesthesia Evaluation  Patient identified by MRN, date of birth, ID band Patient awake    Reviewed: Allergy & Precautions, H&P , NPO status , Patient's Chart, lab work & pertinent test results  Airway Mallampati: II  Neck ROM: full    Dental   Pulmonary Current Smoker,          Cardiovascular hypertension,     Neuro/Psych  Headaches, Anxiety    GI/Hepatic GERD-  ,  Endo/Other    Renal/GU      Musculoskeletal   Abdominal   Peds  Hematology   Anesthesia Other Findings   Reproductive/Obstetrics                           Anesthesia Physical Anesthesia Plan  ASA: II  Anesthesia Plan: General   Post-op Pain Management:    Induction: Intravenous  Airway Management Planned: Oral ETT  Additional Equipment:   Intra-op Plan:   Post-operative Plan: Extubation in OR  Informed Consent: I have reviewed the patients History and Physical, chart, labs and discussed the procedure including the risks, benefits and alternatives for the proposed anesthesia with the patient or authorized representative who has indicated his/her understanding and acceptance.     Plan Discussed with: CRNA, Anesthesiologist and Surgeon  Anesthesia Plan Comments:         Anesthesia Quick Evaluation

## 2013-03-18 NOTE — Anesthesia Procedure Notes (Signed)
Procedure Name: Intubation Date/Time: 03/18/2013 3:14 PM Performed by: Margaree Mackintosh Pre-anesthesia Checklist: Patient identified, Timeout performed, Emergency Drugs available, Suction available and Patient being monitored Patient Re-evaluated:Patient Re-evaluated prior to inductionOxygen Delivery Method: Circle system utilized Preoxygenation: Pre-oxygenation with 100% oxygen Intubation Type: IV induction Ventilation: Mask ventilation without difficulty and Oral airway inserted - appropriate to patient size Laryngoscope Size: Mac and 4 Grade View: Grade II Tube type: Oral Tube size: 7.5 mm Number of attempts: 1 Airway Equipment and Method: Stylet and LTA kit utilized Placement Confirmation: ETT inserted through vocal cords under direct vision,  positive ETCO2 and breath sounds checked- equal and bilateral Secured at: 22 cm Tube secured with: Tape Dental Injury: Teeth and Oropharynx as per pre-operative assessment

## 2013-03-18 NOTE — Transfer of Care (Signed)
Immediate Anesthesia Transfer of Care Note  Patient: [name] [name]  Procedure(s) Performed: Procedure(s) with comments: ANTERIOR CERVICAL DECOMPRESSION/DISCECTOMY FUSION 3 LEVELS (N/A) - Anterior Cervical Three-Six  Decompression/Diskectomy/Fusion  Patient Location: PACU  Anesthesia Type:General  Level of Consciousness: awake, alert  and oriented  Airway & Oxygen Therapy: Patient Spontanous Breathing and Patient connected to nasal cannula oxygen  Post-op Assessment: Report given to PACU RN, Post -op Vital signs reviewed and stable and Patient moving all extremities X 4  Post vital signs: Reviewed and stable  Complications: No apparent anesthesia complications

## 2013-03-18 NOTE — Anesthesia Postprocedure Evaluation (Signed)
  Anesthesia Post-op Note  Patient: [name] [name]  Procedure(s) Performed: Procedure(s) with comments: ANTERIOR CERVICAL DECOMPRESSION/DISCECTOMY FUSION 3 LEVELS (N/A) - Anterior Cervical Three-Six  Decompression/Diskectomy/Fusion  Patient Location: PACU  Anesthesia Type:General  Level of Consciousness: awake, alert , oriented and patient cooperative  Airway and Oxygen Therapy: Patient Spontanous Breathing and Patient connected to nasal cannula oxygen  Post-op Pain: mild  Post-op Assessment: Post-op Vital signs reviewed, Patient's Cardiovascular Status Stable, Respiratory Function Stable, Patent Airway, No signs of Nausea or vomiting and Pain level controlled  Post-op Vital Signs: Reviewed and stable  Complications: No apparent anesthesia complications

## 2013-03-18 NOTE — Preoperative (Signed)
Beta Blockers   Reason not to administer Beta Blockers:Not Applicable, took atenolol this am 

## 2013-03-18 NOTE — Progress Notes (Signed)
Awake, alert, conversant.  Full strength both D/B/T.  MAEW.  Still has some right shoulder pain consistent with preop.

## 2013-03-18 NOTE — Interval H&P Note (Signed)
History and Physical Interval Note:  03/18/2013 2:53 PM  [name] [name]  has presented today for surgery, with the diagnosis of Cervicalgia  The various methods of treatment have been discussed with the patient and family. After consideration of risks, benefits and other options for treatment, the patient has consented to  Procedure(s) with comments: ANTERIOR CERVICAL DECOMPRESSION/DISCECTOMY FUSION 3 LEVELS (N/A) - C3-4 C4-5 C5-6 Anterior cervical decompression/diskectomy/fusion as a surgical intervention .  The patient's history has been reviewed, patient examined, no change in status, stable for surgery.  I have reviewed the patient's chart and labs.  Questions were answered to the patient's satisfaction.     Rafeal Skibicki D

## 2013-03-19 MED ORDER — OXYCODONE-ACETAMINOPHEN 10-325 MG PO TABS: 1.0000 | ORAL_TABLET | Freq: Four times a day (QID) | ORAL | Status: DC | PRN

## 2013-03-19 MED ORDER — METHOCARBAMOL 750 MG PO TABS
750.0000 mg | ORAL_TABLET | Freq: Three times a day (TID) | ORAL | Status: DC | PRN
Start: 2013-03-19 — End: 2013-08-25

## 2013-03-19 NOTE — Discharge Summary (Signed)
Physician Discharge Summary  Patient ID: [name] [name] MRN: [id number] DOB/AGE: July 03, 1961 52 y.o.  Admit date: 03/18/2013 Discharge date: 03/19/2013  Admission Diagnoses: Cervical spondylosis   Discharge Diagnoses: Same   Discharged Condition: good  Hospital Course: The patient was admitted on 03/18/2013 and taken to the operating room where the patient underwent ACDF. The patient tolerated the procedure well and was taken to the recovery room and then to the floor in stable condition. The hospital course was routine. There were no complications. The wound remained clean dry and intact. Pt had appropriate neck soreness. No complaints of arm pain or new N/T/W. The patient remained afebrile with stable vital signs, and tolerated a regular diet. The patient continued to increase activities, and pain was well controlled with oral pain medications.   Consults: None  Significant Diagnostic Studies:  Results for orders placed during the hospital encounter of 03/18/13  CBC      Result Value Range   WBC 12.8 (*) 4.0 - 10.5 K/uL   RBC 4.74  4.22 - 5.81 MIL/uL   Hemoglobin 13.2  13.0 - 17.0 g/dL   HCT 65.7 (*) 84.6 - 96.2 %   MCV 81.2  78.0 - 100.0 fL   MCH 27.8  26.0 - 34.0 pg   MCHC 34.3  30.0 - 36.0 g/dL   RDW 95.2  84.1 - 32.4 %   Platelets 359  150 - 400 K/uL  DIFFERENTIAL      Result Value Range   Neutrophils Relative % 57  43 - 77 %   Neutro Abs 7.3  1.7 - 7.7 K/uL   Lymphocytes Relative 33  12 - 46 %   Lymphs Abs 4.3 (*) 0.7 - 4.0 K/uL   Monocytes Relative 7  3 - 12 %   Monocytes Absolute 0.9  0.1 - 1.0 K/uL   Eosinophils Relative 3  0 - 5 %   Eosinophils Absolute 0.4  0.0 - 0.7 K/uL   Basophils Relative 0  0 - 1 %   Basophils Absolute 0.0  0.0 - 0.1 K/uL  POCT I-STAT 4, (NA,K, GLUC, HGB,HCT)      Result Value Range   Sodium 139  135 - 145 mEq/L   Potassium 3.7  3.5 - 5.1 mEq/L   Glucose, Bld 97  70 - 99 mg/dL   HCT 40.1  02.7 - 25.3 %   Hemoglobin 14.3  13.0 - 17.0  g/dL    Dg Chest 2 View  6/64/4034   *RADIOLOGY REPORT*  Clinical Data: Fall.  Trauma.  Hypertension.  CHEST - 2 VIEW  Comparison: 03/09/2013  Findings: Stable scarring at the lung bases.  Heart size within normal limits for technique.  Atherosclerotic aortic arch noted. Otherwise negative.  IMPRESSION:  1.  Stable bibasilar scarring.   Original Report Authenticated By: Gaylyn Rong, M.D.   Dg Chest 2 View  03/09/2013   *RADIOLOGY REPORT*  Clinical Data: Preop  CHEST - 2 VIEW  Comparison: 07/09/2010  Findings: Cardiomediastinal silhouette is stable.  No acute infiltrate or pleural effusion.  No pulmonary edema.  Stable scarring right middle lobe and lingula.  Bony thorax is stable.  IMPRESSION: No active disease.  No significant change.   Original Report Authenticated By: Natasha Mead, M.D.   Dg Cervical Spine 2-3 Views  03/18/2013   *RADIOLOGY REPORT*  Clinical Data: C3-4 through C5-6 ACDF.  OPERATIVE CERVICAL SPINE - 2-3 VIEW  Comparison: Cervical spine x-rays 03/13/2013.  Findings: Images were submitted for interpretation postoperatively.  Image labeled demonstrates the localizer needle with its tip projected over the C3-4 disc space anteriorly.  Image labeled #2 demonstrates ACDF from C3-C6 with interbody fusion plugs in the disc spaces.  I am only able to visualize through the C5 level on this image.  Alignment appears anatomic.  Fusion plugs in the C3-4 and C4-5 disc spaces are appropriately positioned.  IMPRESSION: ACDF with hardware from C3-4 through C5-6 with interbody fusion plugs.   Original Report Authenticated By: Hulan Saas, M.D.   Dg Cervical Spine Complete  03/13/2013   *RADIOLOGY REPORT*  Clinical Data: Fall.  Neck pain.  Numbness and weakness in the right side of the body.  CERVICAL SPINE - COMPLETE 4+ VIEW  Comparison: 08/23/2012  Findings: Straightening of the normal cervical lordosis noted.  No malalignment or prevertebral soft tissue swelling noted.  No cervical spine  fracture identified.  IMPRESSION: 1.  No fracture or static instability identified. 2.  Straightening of the normal cervical lordosis, possibly from muscle spasm.   Original Report Authenticated By: Gaylyn Rong, M.D.   Dg Shoulder Right  03/13/2013   *RADIOLOGY REPORT*  Clinical Data: Right shoulder pain.  Limited range of motion. Fall.  RIGHT SHOULDER - 2+ VIEW  Comparison: None.  Findings: No fracture or dislocation observed.  No acute bony findings.  IMPRESSION:  1.  No significant abnormality identified.   Original Report Authenticated By: Gaylyn Rong, M.D.   US Soft Tissue Head/neck  03/03/2013   *RADIOLOGY REPORT*  Clinical Data: Thyroid nodules.  THYROID ULTRASOUND  Technique: Ultrasound examination of the thyroid gland and adjacent soft tissues was performed.  Comparison:  09/16/2012  Findings:  Right thyroid lobe:  5.8 x 1.7 x 2.5 cm. Left thyroid lobe:  6.0 x 2.2 x 2.2 cm. Isthmus:  0.3 cm.  Focal nodules:  Largest nodule on the right is 0.7 x 0.4 x 0.7 cm, solid but inhomogeneous, essentially unchanged.  The patient has multiple tiny nodules on the left as well as a complex cystic lesion in the lower pole measuring 1.6 x 1.2 x 1.2 cm  and an adjacent solid nodule measuring 1.4 x 1.0 x 1.2 cm with microcalcifications.  This is minimally larger than on the prior exam but within normal limits of variation between scans.  No new nodules.  There are several other small cystic and solid nodules on the left which are stable.  Lymphadenopathy:  No significant adenopathy.  IMPRESSION: The solid nodule in the left lower pole does contain microcalcifications on the current exam.  This nodule fits criteria for fine needle aspiration biopsy.  No other change.   Original Report Authenticated By: Francene Boyers, M.D.   US Biopsy  03/11/2013   *RADIOLOGY REPORT*  Clinical Data:  Patient with history of left thyroid nodules noted on MRI of the cervical spine.  Follow-up thyroid ultrasound on 03/03/2013  revealed a solid nodule in the left lower pole with microcalcifications measuring 1.4 x 1.0 x 1.2 cm.  Request is now made for needle aspirate biopsy of this left lower pole thyroid nodule.  ULTRASOUND GUIDED NEEDLE ASPIRATE BIOPSY ,LEFT LOWER POLE THYROID NODULE  Thyroid biopsy was thoroughly discussed with the patient and questions were answered.  The benefits, risks, alternatives, and complications were also discussed.  The patient understands and wishes to proceed with the procedure.  Written consent was obtained.  Ultrasound was performed to localize and mark an adequate site for the biopsy.  The patient was then prepped and draped in a normal  sterile fashion.  Local anesthesia was provided with 1% lidocaine. Using direct ultrasound guidance, 4 passes were made using 25 gauge needles into the nodule within the left lower lobe of the thyroid. Ultrasound was used to confirm needle placements on all occasions. Specimens were sent to Pathology for analysis.  Complications:  none  IMPRESSION: Ultrasound guided needle aspirate biopsy performed of aleft lower pole thyroid nodule. Final pathology pending.  Read by: Jeananne Rama, P.A.-C   Original Report Authenticated By: Richarda Overlie, M.D.    Antibiotics:  Anti-infectives   Start     Dose/Rate Route Frequency Ordered Stop   03/18/13 0600  vancomycin (VANCOCIN) IVPB 1000 mg/200 mL premix     1,000 mg 200 mL/hr over 60 Minutes Intravenous On call to O.R. 03/17/13 1428 03/18/13 1509      Discharge Exam: Blood pressure 126/76, pulse 77, temperature 98.8 F (37.1 C), temperature source Oral, resp. rate 18, SpO2 92.00%. Neurologic: Grossly normal Incision clean dry and intact  Discharge Medications:     Medication List    STOP taking these medications       oxyCODONE-acetaminophen 5-325 MG per tablet  Commonly known as:  PERCOCET      TAKE these medications       atenolol-chlorthalidone 100-25 MG per tablet  Commonly known as:  TENORETIC  Take 1  tablet by mouth daily.     LORazepam 1 MG tablet  Commonly known as:  ATIVAN  Take 1 mg by mouth 2 (two) times daily between meals as needed for anxiety.     methocarbamol 750 MG tablet  Commonly known as:  ROBAXIN  Take 1 tablet (750 mg total) by mouth 3 (three) times daily as needed.     multivitamin with minerals Tabs  Take 1 tablet by mouth daily.     omeprazole 20 MG tablet  Commonly known as:  PRILOSEC OTC  Take 40 mg by mouth daily.     oxyCODONE-acetaminophen 10-325 MG per tablet  Commonly known as:  PERCOCET  Take 1-2 tablets by mouth every 6 (six) hours as needed for pain.     sildenafil 100 MG tablet  Commonly known as:  VIAGRA  Take 100 mg by mouth daily as needed for erectile dysfunction.        Disposition: Home   Final Dx: ACDF      Discharge Orders   Future Orders Complete By Expires     Call MD for:  difficulty breathing, headache or visual disturbances  As directed     Call MD for:  persistant nausea and vomiting  As directed     Call MD for:  redness, tenderness, or signs of infection (pain, swelling, redness, odor or green/yellow discharge around incision site)  As directed     Call MD for:  severe uncontrolled pain  As directed     Call MD for:  temperature >100.4  As directed     Diet - low sodium heart healthy  As directed     Discharge instructions  As directed     Comments:      No heavy lifting, no driving, no strenuous activity, may shower normal    Increase activity slowly  As directed        Follow-up Information   Follow up with STERN,JOSEPH D, MD. Schedule an appointment as soon as possible for a visit in 2 weeks.   Contact information:   [address], [address] [place]  [place] [address] [phone]        Signed: Tia Alert 03/19/2013, 10:18 AM

## 2013-03-19 NOTE — Evaluation (Signed)
Physical Therapy Evaluation Patient Details Name: [name] [name] MRN: [id number] DOB: 03-Apr-1961 Today's Date: 03/19/2013 Time: 4098-1191 PT Time Calculation (min): 31 min  PT Assessment / Plan / Recommendation Clinical Impression  Pt s/p anterior cervical decompression presenting with hard collar and cervical precautions. Pt tolerating mobility well. Pt with good home set up and 24/7 assist/support. pt provided handout with precautions and pt with verbal understanding and return demonstation. Pt safe to d/c home when medically stable. Pt with no further acute PT needs. PT signing off at this time. Please re-consult if needed in future.    PT Assessment  Patent does not need any further PT services    Follow Up Recommendations  No PT follow up;Supervision - Intermittent    Does the patient have the potential to tolerate intense rehabilitation      Barriers to Discharge        Equipment Recommendations  None recommended by PT    Recommendations for Other Services     Frequency      Precautions / Restrictions Precautions Precautions: Cervical Precaution Comments: PT delivered precaution informational sheet and explained precautions to pt (pt with verbal confirmation of handout) Required Braces or Orthoses: Cervical Brace Cervical Brace: Hard collar Restrictions Weight Bearing Restrictions: No Other Position/Activity Restrictions: No lifting more than 5-10 lbs   Pertinent Vitals/Pain 4/10 at cervical incision       Mobility  Bed Mobility Bed Mobility: Rolling Right;Right Sidelying to Sit;Sitting - Scoot to [employer] of Bed;Sit to Sidelying Right Rolling Right: 5: Supervision;4: Min guard (verbal cues for technique, min guard due to 1st time OOB) Right Sidelying to Sit: 4: Min guard (v/cs for hand placement and sequencing) Sit to Sidelying Right: 4: Min guard;HOB flat (v/cs for sequencing with precautions) Details for Bed Mobility Assistance: Pt was able to return demo log roll  with superivison Transfers Transfers: Sit to Stand;Stand to Sit Sit to Stand: From bed;With armrests;4: Min guard Stand to Sit: 5: Supervision;To chair/3-in-1 Details for Transfer Assistance: v/c's for hand placement Ambulation/Gait Ambulation/Gait Assistance: 4: Min guard;5: Supervision Ambulation Distance (Feet): 150 Feet Assistive device: None Ambulation/Gait Assistance Details: no episodes of LOB Gait Pattern: Step-through pattern Gait velocity: wfl Stairs: Yes Stairs Assistance: 4: Min guard Stair Management Technique: One rail Right Number of Stairs: 12 (non-reciprocal descend, reciprocal ascend) Naval architect Mobility: No    Exercises     PT Diagnosis:    PT Problem List:   PT Treatment Interventions:     PT Goals Acute Rehab PT Goals PT Goal Formulation:  (n/a)  Visit Information  Last PT Received On: 03/19/13 Assistance Needed: +1    Subjective Data  Subjective: Pt recieved sitting up in bed, agreeable to PT Patient Stated Goal: To go home   Prior Functioning  Home Living Lives With: Spouse Available Help at Discharge: Family;Available 24 hours/day Type of Home: House Home Access: Stairs to enter [employer] of Steps: 5 Entrance Stairs-Rails: Right Home Layout: Two level;Bed/bath upstairs Alternate Level Stairs-Number of Steps: flight Alternate Level Stairs-Rails: Right Bathroom Shower/Tub: Health visitor: Standard Home Adaptive Equipment: Shower chair without back;Grab bars around toilet (hand rail on shower door) Prior Function Level of Independence: Independent Able to Take Stairs?: Yes Driving: No (pt drives "a little") Vocation: On disability Comments: Wife comments pt was able to help with some yardwork and required help putting his shirt when "his arm was gibing him fits" Communication Communication: No difficulties Dominant Hand: Right    Cognition  Cognition Arousal/Alertness:  Awake/alert Behavior During Therapy: WFL for tasks assessed/performed Overall Cognitive Status: Within Functional Limits for tasks assessed    Extremity/Trunk Assessment Right Upper Extremity Assessment RUE ROM/Strength/Tone: Deficits RUE ROM/Strength/Tone Deficits: R shoulder pain and range limitations RUE Sensation: Deficits RUE Sensation Deficits: Pt reports tingling in R UE Left Upper Extremity Assessment LUE ROM/Strength/Tone: WFL for tasks assessed LUE Coordination: WFL - gross motor Right Lower Extremity Assessment RLE ROM/Strength/Tone: WFL for tasks assessed RLE Coordination: WFL - gross motor Left Lower Extremity Assessment LLE Coordination: WFL - gross motor   Balance    End of Session PT - End of Session Equipment Utilized During Treatment: Gait belt;Cervical collar Activity Tolerance: Patient tolerated treatment well Patient left: in chair;with call bell/phone within reach;with family/visitor present Nurse Communication:  (Additional collar)  GP Functional Assessment Tool Used: clinical judement Functional Limitation: Mobility: Walking and moving around Mobility: Walking and Moving Around Current Status (A5409): At least 1 percent but less than 20 percent impaired, limited or restricted Mobility: Walking and Moving Around Goal Status [phone]): At least 1 percent but less than 20 percent impaired, limited or restricted Mobility: Walking and Moving Around Discharge Status [phone]): At least 1 percent but less than 20 percent impaired, limited or restricted   Marcene Brawn 03/19/2013, 9:01 AM  Lewis Shock, PT, DPT Pager #: [phone] Office #: [phone]

## 2013-03-19 NOTE — Evaluation (Signed)
Occupational Therapy Evaluation Patient Details Name: [name] [name] MRN: [id number] DOB: 07/15/1961 Today's Date: 03/19/2013 Time: 9563-8756 OT Time Calculation (min): 30 min  OT Assessment / Plan / Recommendation Clinical Impression  Pt is recovering from ACDF.  Moving well and able to perform ADL with supervision to modified independently.  Educated pt in precautions related to cervical surgery.  No further OT or DME needs.    OT Assessment  Patient does not need any further OT services    Follow Up Recommendations  No OT follow up    Barriers to Discharge      Equipment Recommendations  None recommended by OT    Recommendations for Other Services    Frequency       Precautions / Restrictions Precautions Precautions: Cervical Precaution Comments: reviewd cervical precautions with pt and wife Required Braces or Orthoses: Cervical Brace Cervical Brace: Hard collar Restrictions Weight Bearing Restrictions: No Other Position/Activity Restrictions: No lifting more than 5-10 lbs   Pertinent Vitals/Pain 5/10 neck, premedicated    ADL  Transfers/Ambulation Related to ADLs: supervision, no device ADL Comments: Pt instructed in ADL related to precautions.  Able to cross foot over opposite knee for reaching feet. Performing ADL at supervision to mod I level.  Will use shower seat.      OT Diagnosis:    OT Problem List:   OT Treatment Interventions:     OT Goals    Visit Information  Last OT Received On: 03/19/13 Assistance Needed: +1    Subjective Data  Subjective: "Tell the nurse I need something else for pain." Patient Stated Goal: pain free   Prior Functioning     Home Living Lives With: Spouse Available Help at Discharge: Family;Available 24 hours/day Type of Home: House Home Access: Stairs to enter [employer] of Steps: 5 Entrance Stairs-Rails: Right Home Layout: Two level;Bed/bath upstairs Alternate Level Stairs-Number of Steps:  flight Alternate Level Stairs-Rails: Right Bathroom Shower/Tub: Health visitor: Standard Home Adaptive Equipment: Shower chair without back;Grab bars around toilet Prior Function Level of Independence: Independent Able to Take Stairs?: Yes Driving: No Vocation: On disability Comments: Wife comments pt was able to help with some yardwork and required help putting his shirt when "his arm was gibing him fits" Communication Communication: No difficulties Dominant Hand: Right         Vision/Perception Vision - History Baseline Vision: Wears glasses all the time Patient Visual Report: No change from baseline   Cognition  Cognition Arousal/Alertness: Awake/alert Behavior During Therapy: WFL for tasks assessed/performed Overall Cognitive Status: Within Functional Limits for tasks assessed    Extremity/Trunk Assessment Right Upper Extremity Assessment RUE ROM/Strength/Tone: Deficits RUE ROM/Strength/Tone Deficits: R shoulder pain and range limitations, instructed in A/AROM in supine--R shoulder 3 x per day.  Pt with hx of adhesive capsulitis on L. RUE Sensation: Deficits RUE Sensation Deficits: Pt reports tingling in R UE Left Upper Extremity Assessment LUE ROM/Strength/Tone: WFL for tasks assessed LUE Coordination: WFL - gross/fine motor     Mobility Bed Mobility Bed Mobility: Sit to Sidelying Left  Sit to Sidelying Left: 6: Modified independent (Device/Increase time);HOB flat Details for Bed Mobility Assistance: Pt was able to return demo log roll with superivison Transfers Transfers: Sit to Stand;Stand to Sit Sit to Stand: With upper extremity assist;5: Supervision;From chair/3-in-1 Stand to Sit: 5: Supervision;To bed Details for Transfer Assistance: v/c's for hand placement     Exercise     Balance     End of Session OT -  End of Session Equipment Utilized During Treatment: Cervical collar Activity Tolerance: Patient limited by pain Patient left:  in bed;with call bell/phone within reach;with family/visitor present Nurse Communication: Patient requests pain meds  GO Functional Assessment Tool Used: clinical judgement Functional Limitation: Self care Self Care Current Status (A5409): At least 1 percent but less than 20 percent impaired, limited or restricted Self Care Goal Status (W1191): At least 1 percent but less than 20 percent impaired, limited or restricted Self Care Discharge Status [phone]): At least 1 percent but less than 20 percent impaired, limited or restricted   Evern Bio 03/19/2013, 9:50 AM [phone]

## 2013-03-19 NOTE — Progress Notes (Signed)
Pt. Alert and oriented,follows simple instructions, denies pain. Incision area without swelling, redness or S/S of infection. Voiding adequate clear yellow urine. Moving all extremities well and vitals stable and documented. Anterior Cervical Fusion surgery notes instructions given to patient and family member for home safety and precautions. Pt. and family stated understanding of instructions given. 

## 2013-03-22 ENCOUNTER — Encounter (HOSPITAL_COMMUNITY): Payer: Self-pay | Admitting: Neurosurgery

## 2013-05-03 ENCOUNTER — Ambulatory Visit: Payer: Medicaid Other | Attending: Family Medicine | Admitting: Internal Medicine

## 2013-05-03 ENCOUNTER — Encounter: Payer: Self-pay | Admitting: Internal Medicine

## 2013-05-03 VITALS — BP 127/85 | HR 78 | Temp 98.2°F | Resp 16 | Ht 70.0 in | Wt 191.2 lb

## 2013-05-03 DIAGNOSIS — I10 Essential (primary) hypertension: Secondary | ICD-10-CM | POA: Insufficient documentation

## 2013-05-03 DIAGNOSIS — M25511 Pain in right shoulder: Secondary | ICD-10-CM

## 2013-05-03 DIAGNOSIS — K219 Gastro-esophageal reflux disease without esophagitis: Secondary | ICD-10-CM

## 2013-05-03 DIAGNOSIS — M25519 Pain in unspecified shoulder: Secondary | ICD-10-CM

## 2013-05-03 DIAGNOSIS — G894 Chronic pain syndrome: Secondary | ICD-10-CM

## 2013-05-03 NOTE — Progress Notes (Signed)
Pt here for orthopedic referral. C/o right shoulder and left knee pain intermit. States he has medicaid and needs referral.vss

## 2013-05-03 NOTE — Progress Notes (Signed)
Patient ID: [name] [name], male   DOB: 1961-07-12, 52 y.o.   MRN: [id number]  CC: Shoulder pain  HPI: 52 year old male with past medical history hypertension, anxiety, acid reflux who presented to our clinic for orthopedic surgery referral. Patient reports right shoulder pain getting worse over past couple of months. He is able to have full range of motion but is in significant amount of pain with executive those ranges of motion. Patient also reports left knee pain and usually he gets corticosteroid injections. He reports having difficulty bearing weight on the knee and believes he may need surgery.  Allergies  Allergen Reactions  . Morphine     REACTION: itching  . Penicillins     REACTION: rash   Past Medical History  Diagnosis Date  . Hypertension   . Thyroid nodule   . Anxiety   . GERD (gastroesophageal reflux disease)   . JYNWGNFA(213.0)    Current Outpatient Prescriptions on File Prior to Visit  Medication Sig Dispense Refill  . atenolol-chlorthalidone (TENORETIC) 100-25 MG per tablet Take 1 tablet by mouth daily.      Marland Kitchen LORazepam (ATIVAN) 1 MG tablet Take 1 mg by mouth 2 (two) times daily between meals as needed for anxiety.      . methocarbamol (ROBAXIN) 750 MG tablet Take 1 tablet (750 mg total) by mouth 3 (three) times daily as needed.  60 tablet  1  . Multiple Vitamin (MULTIVITAMIN WITH MINERALS) TABS Take 1 tablet by mouth daily.      Marland Kitchen omeprazole (PRILOSEC OTC) 20 MG tablet Take 40 mg by mouth daily.      Marland Kitchen oxyCODONE-acetaminophen (PERCOCET) 10-325 MG per tablet Take 1-2 tablets by mouth every 6 (six) hours as needed for pain.  90 tablet  0  . sildenafil (VIAGRA) 100 MG tablet Take 100 mg by mouth daily as needed for erectile dysfunction.       No current facility-administered medications on file prior to visit.   Family medical history significant for HTN, HLD  History   Social History  . Marital Status: Married    Spouse Name: N/A    Number of Children: N/A  .  Years of Education: N/A   Occupational History  . Not on file.   Social History Main Topics  . Smoking status: Current Every Day Smoker -- 1.00 packs/day for 20 years    Types: Cigarettes  . Smokeless tobacco: Not on file  . Alcohol Use: Yes     Comment: occasionally  . Drug Use: No  . Sexually Active: Not on file   Other Topics Concern  . Not on file   Social History Narrative  . No narrative on file    Review of Systems  Constitutional: Negative for fever, chills, diaphoresis, activity change, appetite change and fatigue.  HENT: Negative for ear pain, nosebleeds, congestion, facial swelling, rhinorrhea, neck pain, neck stiffness and ear discharge.   Eyes: Negative for pain, discharge, redness, itching and visual disturbance.  Respiratory: Negative for cough, choking, chest tightness, shortness of breath, wheezing and stridor.   Cardiovascular: Negative for chest pain, palpitations and leg swelling.  Gastrointestinal: Negative for abdominal distention.  Genitourinary: Negative for dysuria, urgency, frequency, hematuria, flank pain, decreased urine volume, difficulty urinating and dyspareunia.  Musculoskeletal: Positive for right shoulder pain and left knee  Neurological: Negative for dizziness, tremors, seizures, syncope, facial asymmetry, speech difficulty, weakness, light-headedness, numbness and headaches.  Hematological: Negative for adenopathy. Does not bruise/bleed easily.  Psychiatric/Behavioral: Negative for hallucinations,  behavioral problems, confusion, dysphoric mood, decreased concentration and agitation.    Objective:   Filed Vitals:   05/03/13 1204  BP: 127/85  Pulse: 78  Temp: 98.2 F (36.8 C)  Resp: 16    Physical Exam  Constitutional: Appears well-developed and well-nourished. No distress.  HENT: Normocephalic. External right and left ear normal. Oropharynx is clear and moist.  Eyes: Conjunctivae and EOM are normal. PERRLA, no scleral icterus.   Neck: Normal ROM. Neck supple. No JVD. No tracheal deviation. No thyromegaly.  CVS: RRR, S1/S2 +, no murmurs, no gallops, no carotid bruit.  Pulmonary: Effort and breath sounds normal, no stridor, rhonchi, wheezes, rales.  Abdominal: Soft. BS +,  no distension, tenderness, rebound or guarding.  Musculoskeletal: Normal range of motion. No edema and no tenderness.  Lymphadenopathy: No lymphadenopathy noted, cervical, inguinal. Neuro: Alert. Normal reflexes, muscle tone coordination. No cranial nerve deficit. Skin: Skin is warm and dry. No rash noted. Not diaphoretic. No erythema. No pallor.  Psychiatric: Normal mood and affect. Behavior, judgment, thought content normal.   Lab Results  Component Value Date   WBC 12.8* 03/18/2013   HGB 14.3 03/18/2013   HCT 42.0 03/18/2013   MCV 81.2 03/18/2013   PLT 359 03/18/2013   Lab Results  Component Value Date   CREATININE 0.87 03/09/2013   BUN 16 03/09/2013   NA 139 03/18/2013   K 3.7 03/18/2013   CL 98 03/09/2013   CO2 26 03/09/2013    No results found for this basename: HGBA1C   Lipid Panel  No results found for this basename: chol, trig, hdl, cholhdl, vldl, ldlcalc       Assessment and plan:   Patient Active Problem List   Diagnosis Date Noted  . Pain in joint, shoulder region 05/03/2013    Priority: Medium - Referral for orthopedic surgery provided   . HTN (hypertension) 05/03/2013    Priority: Medium - Continue home medications, atenolol - chlorthalidone   . GERD 07/24/2008    Priority: Medium - Continue omeprazole

## 2013-05-03 NOTE — Patient Instructions (Addendum)
Shoulder Pain  The shoulder is the joint that connects your arms to your body. The bones that form the shoulder joint include the upper arm bone (humerus), the shoulder blade (scapula), and the collarbone (clavicle). The top of the humerus is shaped like a ball and fits into a rather flat socket on the scapula (glenoid cavity). A combination of muscles and strong, fibrous tissues that connect muscles to bones (tendons) support your shoulder joint and hold the ball in the socket. Small, fluid-filled sacs (bursae) are located in different areas of the joint. They act as cushions between the bones and the overlying soft tissues and help reduce friction between the gliding tendons and the bone as you move your arm. Your shoulder joint allows a wide range of motion in your arm. This range of motion allows you to do things like scratch your back or throw a ball. However, this range of motion also makes your shoulder more prone to pain from overuse and injury.  Causes of shoulder pain can originate from both injury and overuse and usually can be grouped in the following four categories:   Redness, swelling, and pain (inflammation) of the tendon (tendinitis) or the bursae (bursitis).   Instability, such as a dislocation of the joint.   Inflammation of the joint (arthritis).   Broken bone (fracture).  HOME CARE INSTRUCTIONS    Apply ice to the sore area.   Put ice in a plastic bag.   Place a towel between your skin and the bag.   Leave the ice on for 15-20 minutes, 3-4 times per day for the first 2 days.   Stop using cold packs if they do not help with the pain.   If you have a shoulder sling or immobilizer, wear it as long as your caregiver instructs. Only remove it to shower or bathe. Move your arm as little as possible, but keep your hand moving to prevent swelling.   Squeeze a soft ball or foam pad as much as possible to help prevent swelling.   Only take over-the-counter or prescription medicines for pain,  discomfort, or fever as directed by your caregiver.  SEEK MEDICAL CARE IF:    Your shoulder pain increases, or new pain develops in your arm, hand, or fingers.   Your hand or fingers become cold and numb.   Your pain is not relieved with medicines.  SEEK IMMEDIATE MEDICAL CARE IF:    Your arm, hand, or fingers are numb or tingling.   Your arm, hand, or fingers are significantly swollen or turn white or blue.  MAKE SURE YOU:    Understand these instructions.   Will watch your condition.   Will get help right away if you are not doing well or get worse.  Document Released: 07/23/2005 Document Revised: 07/07/2012 Document Reviewed: 09/27/2011  [employer] Patient Information 2014 [employer]

## 2013-06-27 DIAGNOSIS — S83249A Other tear of medial meniscus, current injury, unspecified knee, initial encounter: Secondary | ICD-10-CM

## 2013-06-27 DIAGNOSIS — M94269 Chondromalacia, unspecified knee: Secondary | ICD-10-CM

## 2013-06-27 HISTORY — DX: Other tear of medial meniscus, current injury, unspecified knee, initial encounter: S83.249A

## 2013-06-27 HISTORY — DX: Chondromalacia, unspecified knee: M94.269

## 2013-07-22 ENCOUNTER — Ambulatory Visit: Payer: Medicaid Other | Attending: Family Medicine | Admitting: Internal Medicine

## 2013-07-22 ENCOUNTER — Other Ambulatory Visit: Payer: Self-pay | Admitting: Orthopedic Surgery

## 2013-07-22 VITALS — BP 123/88 | HR 73 | Temp 98.1°F | Ht 70.5 in | Wt 191.0 lb

## 2013-07-22 DIAGNOSIS — I10 Essential (primary) hypertension: Secondary | ICD-10-CM | POA: Insufficient documentation

## 2013-07-22 DIAGNOSIS — M25519 Pain in unspecified shoulder: Secondary | ICD-10-CM | POA: Insufficient documentation

## 2013-07-22 LAB — CBC WITH DIFFERENTIAL/PLATELET
Basophils Absolute: 0 10*3/uL (ref 0.0–0.1)
Eosinophils Relative: 1 % (ref 0–5)
HCT: 36.6 % — ABNORMAL LOW (ref 39.0–52.0)
Hemoglobin: 11.8 g/dL — ABNORMAL LOW (ref 13.0–17.0)
Lymphocytes Relative: 25 % (ref 12–46)
Lymphs Abs: 3.7 10*3/uL (ref 0.7–4.0)
MCV: 76.1 fL — ABNORMAL LOW (ref 78.0–100.0)
Monocytes Absolute: 1 10*3/uL (ref 0.1–1.0)
Monocytes Relative: 6 % (ref 3–12)
RDW: 15.4 % (ref 11.5–15.5)
WBC: 14.8 10*3/uL — ABNORMAL HIGH (ref 4.0–10.5)

## 2013-07-22 LAB — COMPREHENSIVE METABOLIC PANEL
AST: 20 U/L (ref 0–37)
Albumin: 4.3 g/dL (ref 3.5–5.2)
BUN: 13 mg/dL (ref 6–23)
Calcium: 9.8 mg/dL (ref 8.4–10.5)
Chloride: 100 mEq/L (ref 96–112)
Glucose, Bld: 93 mg/dL (ref 70–99)
Potassium: 3.8 mEq/L (ref 3.5–5.3)
Total Protein: 7 g/dL (ref 6.0–8.3)

## 2013-07-22 LAB — LIPID PANEL
HDL: 48 mg/dL (ref >39)
Triglycerides: 276 mg/dL — ABNORMAL HIGH (ref <150)

## 2013-07-22 MED ORDER — OMEPRAZOLE 40 MG PO CPDR: 40.0000 mg | DELAYED_RELEASE_CAPSULE | Freq: Two times a day (BID) | ORAL | Status: DC

## 2013-07-22 MED ORDER — ATENOLOL-CHLORTHALIDONE 100-25 MG PO TABS: 1.0000 | ORAL_TABLET | Freq: Every day | ORAL | Status: DC

## 2013-07-22 NOTE — Progress Notes (Signed)
Patient ID: [name] [name], male   DOB: Jul 19, 1961, 52 y.o.   MRN: [id number]   CC: follow up and refill on medication   HPI: Patient is 52 year old male who presents to clinic requiring refill on blood pressure medicine. He denies chest pain or shortness of breath, no abdominal urinary concerns. He reports persistent right shoulder pain, left knee pain for which he has been seeing specialist and his undergoing evaluation for possible surgical intervention. Patient is receiving steroid injection in the left knee area and explains that it does not help. He is narcotic medicine is provided by orthopedic specialist that he seen.  Allergies  Allergen Reactions  . Morphine     REACTION: itching  . Penicillins     REACTION: rash   Past Medical History  Diagnosis Date  . Hypertension   . Thyroid nodule   . Anxiety   . GERD (gastroesophageal reflux disease)   . WUJWJXBJ(478.2)    Current Outpatient Prescriptions on File Prior to Visit  Medication Sig Dispense Refill  . LORazepam (ATIVAN) 1 MG tablet Take 1 mg by mouth 2 (two) times daily between meals as needed for anxiety.      . methocarbamol (ROBAXIN) 750 MG tablet Take 1 tablet (750 mg total) by mouth 3 (three) times daily as needed.  60 tablet  1  . Multiple Vitamin (MULTIVITAMIN WITH MINERALS) TABS Take 1 tablet by mouth daily.      Marland Kitchen oxyCODONE-acetaminophen (PERCOCET) 10-325 MG per tablet Take 1-2 tablets by mouth every 6 (six) hours as needed for pain.  90 tablet  0  . sildenafil (VIAGRA) 100 MG tablet Take 100 mg by mouth daily as needed for erectile dysfunction.       No current facility-administered medications on file prior to visit.   No known family medical history History   Social History  . Marital Status: Married    Spouse Name: N/A    Number of Children: N/A  . Years of Education: N/A   Occupational History  . Not on file.   Social History Main Topics  . Smoking status: Current Every Day Smoker -- 1.00 packs/day  for 20 years    Types: Cigarettes  . Smokeless tobacco: Not on file  . Alcohol Use: Yes     Comment: occasionally  . Drug Use: No  . Sexual Activity: Not on file   Other Topics Concern  . Not on file   Social History Narrative  . No narrative on file    Review of Systems  Constitutional: Negative for fever, chills, diaphoresis, activity change, appetite change and fatigue.  HENT: Negative for ear pain, nosebleeds, congestion, facial swelling, rhinorrhea, neck pain, neck stiffness and ear discharge.   Eyes: Negative for pain, discharge, redness, itching and visual disturbance.  Respiratory: Negative for cough, choking, chest tightness, shortness of breath, wheezing and stridor.   Cardiovascular: Negative for chest pain, palpitations and leg swelling.  Gastrointestinal: Negative for abdominal distention.  Genitourinary: Negative for dysuria, urgency, frequency, hematuria, flank pain, decreased urine volume, difficulty urinating and dyspareunia.  Musculoskeletal: Negative for back pain, joint swelling, arthralgias and gait problem.  Neurological: Negative for dizziness, tremors, seizures, syncope, facial asymmetry, speech difficulty, weakness, light-headedness, numbness and headaches.  Hematological: Negative for adenopathy. Does not bruise/bleed easily.  Psychiatric/Behavioral: Negative for hallucinations, behavioral problems, confusion, dysphoric mood, decreased concentration and agitation.    Objective:   Filed Vitals:   07/22/13 1002  BP: 123/88  Pulse: 73  Temp: 98.1 F (36.7  C)    Physical Exam  Constitutional: Appears well-developed and well-nourished. No distress.  CVS: RRR, S1/S2 +, no murmurs, no gallops, no carotid bruit.  Pulmonary: Effort and breath sounds normal, no stridor, rhonchi, wheezes, rales.  Abdominal: Soft. BS +,  no distension, tenderness, rebound or guarding.  Musculoskeletal: Normal range of motion. No edema and no tenderness.    Lab Results   Component Value Date   WBC 12.8* 03/18/2013   HGB 14.3 03/18/2013   HCT 42.0 03/18/2013   MCV 81.2 03/18/2013   PLT 359 03/18/2013   Lab Results  Component Value Date   CREATININE 0.87 03/09/2013   BUN 16 03/09/2013   NA 139 03/18/2013   K 3.7 03/18/2013   CL 98 03/09/2013   CO2 26 03/09/2013    No results found for this basename: HGBA1C   Lipid Panel  No results found for this basename: chol, trig, hdl, cholhdl, vldl, ldlcalc       Assessment and plan:   Patient Active Problem List   Diagnosis Date Noted  . Pain in joint, shoulder region - undergoing evaluation for surgical intervention  05/03/2013  . HTN (hypertension) - stable and at target range, will provide refill on medication today. Will check electrolyte panel today  05/03/2013

## 2013-07-22 NOTE — Patient Instructions (Signed)

## 2013-07-25 ENCOUNTER — Encounter (HOSPITAL_BASED_OUTPATIENT_CLINIC_OR_DEPARTMENT_OTHER): Payer: Self-pay | Admitting: *Deleted

## 2013-07-25 NOTE — Progress Notes (Signed)
07/25/13 1423  OBSTRUCTIVE SLEEP APNEA  Have you ever been diagnosed with sleep apnea through a sleep study? No  Do you snore loudly (loud enough to be heard through closed doors)?  0  Do you often feel tired, fatigued, or sleepy during the daytime? 1  Has anyone observed you stop breathing during your sleep? 0  Do you have, or are you being treated for high blood pressure? 1  BMI more than 35 kg/m2? 0  Age over 53 years old? 1  Gender: 1  Obstructive Sleep Apnea Score 4  Score 4 or greater  Results sent to PCP (Dr. Jeanann Lewandowsky)

## 2013-07-26 ENCOUNTER — Ambulatory Visit: Payer: Medicaid Other | Attending: Internal Medicine | Admitting: Family Medicine

## 2013-07-26 ENCOUNTER — Encounter: Payer: Self-pay | Admitting: Family Medicine

## 2013-07-26 ENCOUNTER — Encounter: Payer: Self-pay | Admitting: Internal Medicine

## 2013-07-26 VITALS — BP 123/82 | HR 74 | Temp 98.6°F | Resp 16 | Ht 70.0 in | Wt 191.0 lb

## 2013-07-26 DIAGNOSIS — M25569 Pain in unspecified knee: Secondary | ICD-10-CM

## 2013-07-26 DIAGNOSIS — R0989 Other specified symptoms and signs involving the circulatory and respiratory systems: Secondary | ICD-10-CM | POA: Insufficient documentation

## 2013-07-26 DIAGNOSIS — J209 Acute bronchitis, unspecified: Secondary | ICD-10-CM

## 2013-07-26 DIAGNOSIS — R05 Cough: Secondary | ICD-10-CM | POA: Insufficient documentation

## 2013-07-26 DIAGNOSIS — K219 Gastro-esophageal reflux disease without esophagitis: Secondary | ICD-10-CM

## 2013-07-26 DIAGNOSIS — Z720 Tobacco use: Secondary | ICD-10-CM | POA: Insufficient documentation

## 2013-07-26 DIAGNOSIS — F17200 Nicotine dependence, unspecified, uncomplicated: Secondary | ICD-10-CM

## 2013-07-26 DIAGNOSIS — J449 Chronic obstructive pulmonary disease, unspecified: Secondary | ICD-10-CM

## 2013-07-26 DIAGNOSIS — R059 Cough, unspecified: Secondary | ICD-10-CM | POA: Insufficient documentation

## 2013-07-26 DIAGNOSIS — M25562 Pain in left knee: Secondary | ICD-10-CM | POA: Insufficient documentation

## 2013-07-26 MED ORDER — IPRATROPIUM-ALBUTEROL 0.5-2.5 (3) MG/3ML IN SOLN
3.0000 mL | Freq: Once | RESPIRATORY_TRACT | Status: AC
  Administered 2013-07-26: 3 mL via RESPIRATORY_TRACT

## 2013-07-26 MED ORDER — ALBUTEROL SULFATE HFA 108 (90 BASE) MCG/ACT IN AERS: 2.0000 | INHALATION_SPRAY | RESPIRATORY_TRACT | Status: DC | PRN

## 2013-07-26 MED ORDER — CETIRIZINE HCL 10 MG PO TABS: 10.0000 mg | ORAL_TABLET | Freq: Every day | ORAL | Status: DC

## 2013-07-26 MED ORDER — AZITHROMYCIN 500 MG PO TABS: 500.0000 mg | ORAL_TABLET | Freq: Every day | ORAL | Status: DC

## 2013-07-26 MED ORDER — FLUTICASONE PROPIONATE 50 MCG/ACT NA SUSP: 2.0000 | Freq: Every day | NASAL | Status: DC

## 2013-07-26 NOTE — Telephone Encounter (Signed)
Error

## 2013-07-26 NOTE — Progress Notes (Signed)
PT HERE FOR F/O COUGH WITH BROWNISH PHLEGM X 1 WEEK HAVING KNEE SURGERY 10/6 NEEDS CLEARED AFEBRILE

## 2013-07-26 NOTE — Progress Notes (Signed)
Patient ID: [name] [name], male   DOB: June 23, 1961, 52 y.o.   MRN: [id number]  CC: cough   HPI: Patient reports that for the past several days he's had cough and chest congestion and wheezing.  He reports that he's had a chest cold has been really bad recently.  He's having runny nose and nasal congestion and postnasal drainage.  The patient reports that he is scheduled to have arthroscopic left knee surgery in 3 days.  He reports that he was told that if he didn't get his upper respiratory situation cleared up he would have to have his surgery postponed.  He's presenting today for treatment.  The patient denies fever chills nausea and vomiting. The patient denies shortness of breath.  The patient is a chronic smoker.  Allergies  Allergen Reactions  . Morphine Itching  . Penicillins Rash   Past Medical History  Diagnosis Date  . Thyroid nodule   . Anxiety   . GERD (gastroesophageal reflux disease)   . Headache(784.0)     2 x/week  . Arthritis     neck, shoulder, left knee  . Hypertension     under control with med., has been on med. x 1 yr.  . 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 Medial meniscus tear 06/2013    left knee  . Stuffy and runny nose 07/25/2013    clear drainage from nose  . Cough with sputum 07/25/2013    white  . Limited joint range of motion     cervical fusion 02/2013  . Wears partial dentures     upper   Current Outpatient Prescriptions on File Prior to Visit  Medication Sig Dispense Refill  . atenolol-chlorthalidone (TENORETIC) 100-25 MG per tablet Take 1 tablet by mouth daily.  30 tablet  5  . LORazepam (ATIVAN) 1 MG tablet Take 1 mg by mouth 2 (two) times daily between meals as needed for anxiety.      . methocarbamol (ROBAXIN) 750 MG tablet Take 1 tablet (750 mg total) by mouth 3 (three) times daily as needed.  60 tablet  1  . Multiple Vitamin (MULTIVITAMIN WITH MINERALS) TABS Take 1 tablet by mouth daily.      Marland Kitchen omeprazole (PRILOSEC) 40 MG capsule Take 1  capsule (40 mg total) by mouth 2 (two) times daily.  60 capsule  5  . oxyCODONE-acetaminophen (PERCOCET) 10-325 MG per tablet Take 1-2 tablets by mouth every 6 (six) hours as needed for pain.  90 tablet  0  . sildenafil (VIAGRA) 100 MG tablet Take 100 mg by mouth daily as needed for erectile dysfunction.       No current facility-administered medications on file prior to visit.   No family history on file. History   Social History  . Marital Status: Married    Spouse Name: N/A    Number of Children: N/A  . Years of Education: N/A   Occupational History  . Not on file.   Social History Main Topics  . Smoking status: Current Every Day Smoker -- 1.00 packs/day for 25 years    Types: Cigarettes  . Smokeless tobacco: Never Used  . Alcohol Use: Yes     Comment: occasionally  . Drug Use: No  . Sexual Activity: Not on file   Other Topics Concern  . Not on file   Social History Narrative  . No narrative on file    Review of Systems  Constitutional: Negative for fever, chills, diaphoresis, activity  change, appetite change and fatigue.  HENT: Positive for congestion, facial swelling, rhinorrhea.  Negative for ear pain, nosebleeds,  neck pain, neck stiffness and ear discharge.   Eyes: Negative for pain, discharge, redness, itching and visual disturbance.  Respiratory: Positive for cough,chest tightness,  Wheezing.  Negative for stridor and shortness of breath.   Cardiovascular: Negative for chest pain, palpitations and leg swelling.  Gastrointestinal: Negative for abdominal distention.  Genitourinary: Negative for dysuria, urgency, frequency, hematuria, flank pain, decreased urine volume, difficulty urinating and dyspareunia.  Musculoskeletal: Negative for back pain, joint swelling, arthralgias and gait problem.  Neurological: Negative for dizziness, tremors, seizures, syncope, facial asymmetry, speech difficulty, weakness, light-headedness, numbness and headaches.  Hematological:  Negative for adenopathy. Does not bruise/bleed easily.  Psychiatric/Behavioral: Negative for hallucinations, behavioral problems, confusion, dysphoric mood, decreased concentration and agitation.   Objective:   Filed Vitals:   07/26/13 1214  BP: 123/82  Pulse: 74  Temp: 98.6 F (37 C)  Resp: 16   Physical Exam  Constitutional: Appears well-developed and well-nourished. No distress.  HENT: Normocephalic. External right and left ear normal. Oropharynx is clear and moist.  Eyes: Conjunctivae and EOM are normal. PERRLA, no scleral icterus.  Neck: Normal ROM. Neck supple. No JVD. No tracheal deviation. No thyromegaly.  CVS: RRR, S1/S2 +, no murmurs, no gallops, no carotid bruit.  Pulmonary: bilateral inspiratory/expiratory wheezing.  Abdominal: Soft. BS +,  no distension, tenderness, rebound or guarding.  Musculoskeletal: Normal range of motion. No edema and no tenderness.  Lymphadenopathy: No lymphadenopathy noted, cervical, inguinal. Neuro: Alert. Normal reflexes, muscle tone coordination. No cranial nerve deficit. Skin: Skin is warm and dry. No rash noted. Not diaphoretic. No erythema. No pallor.  Psychiatric: Normal mood and affect. Behavior, judgment, thought content normal.   Lab Results  Component Value Date   WBC 14.8* 07/22/2013   HGB 11.8* 07/22/2013   HCT 36.6* 07/22/2013   MCV 76.1* 07/22/2013   PLT 489* 07/22/2013   Lab Results  Component Value Date   CREATININE 0.85 07/22/2013   BUN 13 07/22/2013   NA 138 07/22/2013   K 3.8 07/22/2013   CL 100 07/22/2013   CO2 27 07/22/2013    No results found for this basename: HGBA1C   Lipid Panel     Component Value Date/Time   CHOL 239* 07/22/2013 1021   TRIG 276* 07/22/2013 1021   HDL 48 07/22/2013 1021   CHOLHDL 5.0 07/22/2013 1021   VLDL 55* 07/22/2013 1021   LDLCALC 136* 07/22/2013 1021       Assessment and plan:   Patient Active Problem List   Diagnosis Date Noted  . Smoker 07/26/2013  . COPD (chronic obstructive  pulmonary disease) 07/26/2013  . Left knee pain 07/26/2013  . Pain in joint, shoulder region 05/03/2013  . HTN (hypertension) 05/03/2013  . GERD 07/24/2008  . 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  The patient was counseled on the dangers of tobacco use, and was advised to quit.  Reviewed strategies to maximize success, including  removing cigarettes and smoking materials from environment, stress management and substitution of other forms of reinforcement. The patient verbalized understanding.   Follow up in 2-3 days if no significant improvement.  I explained to the patient that he may have to have his surgery postponed if he doesn't clear up in the next 3 days. The patient verbalized understanding.    The patient was given clear instructions to go to ER or return to medical center if symptoms don't improve, worsen or new problems develop.  The patient verbalized understanding.  The patient was told to call to get any lab results if not heard anything in the next week.    Rodney Langton, MD, CDE, FAAFP [facility] [place], [place]

## 2013-07-26 NOTE — Patient Instructions (Addendum)
Smoking Cessation Quitting smoking is important to your health and has many advantages. However, it is not always easy to quit since nicotine is a very addictive drug. Often times, people try 3 times or more before being able to quit. This document explains the best ways for you to prepare to quit smoking. Quitting takes hard work and a lot of effort, but you can do it. ADVANTAGES OF QUITTING SMOKING  You will live longer, feel better, and live better.  Your body will feel the impact of quitting smoking almost immediately.  Within 20 minutes, blood pressure decreases. Your pulse returns to its normal level.  After 8 hours, carbon monoxide levels in the blood return to normal. Your oxygen level increases.  After 24 hours, the chance of having a heart attack starts to decrease. Your breath, hair, and body stop smelling like smoke.  After 48 hours, damaged nerve endings begin to recover. Your sense of taste and smell improve.  After 72 hours, the body is virtually free of nicotine. Your bronchial tubes relax and breathing becomes easier.  After 2 to 12 weeks, lungs can hold more air. Exercise becomes easier and circulation improves.  The risk of having a heart attack, stroke, cancer, or lung disease is greatly reduced.  After 1 year, the risk of coronary heart disease is cut in half.  After 5 years, the risk of stroke falls to the same as a nonsmoker.  After 10 years, the risk of lung cancer is cut in half and the risk of other cancers decreases significantly.  After 15 years, the risk of coronary heart disease drops, usually to the level of a nonsmoker.  If you are pregnant, quitting smoking will improve your chances of having a healthy baby.  The people you live with, especially any children, will be healthier.  You will have extra money to spend on things other than cigarettes. QUESTIONS TO THINK ABOUT BEFORE ATTEMPTING TO QUIT You may want to talk about your answers with your  caregiver.  Why do you want to quit?  If you tried to quit in the past, what helped and what did not?  What will be the most difficult situations for you after you quit? How will you plan to handle them?  Who can help you through the tough times? Your family? Friends? A caregiver?  What pleasures do you get from smoking? What ways can you still get pleasure if you quit? Here are some questions to ask your caregiver:  How can you help me to be successful at quitting?  What medicine do you think would be best for me and how should I take it?  What should I do if I need more help?  What is smoking withdrawal like? How can I get information on withdrawal? GET READY  Set a quit date.  Change your environment by getting rid of all cigarettes, ashtrays, matches, and lighters in your home, car, or work. Do not let people smoke in your home.  Review your past attempts to quit. Think about what worked and what did not. GET SUPPORT AND ENCOURAGEMENT You have a better chance of being successful if you have help. You can get support in many ways.  Tell your family, friends, and co-workers that you are going to quit and need their support. Ask them not to smoke around you.  Get individual, group, or telephone counseling and support. Programs are available at local hospitals and health centers. Call your local health department for   information about programs in your area.  Spiritual beliefs and practices may help some smokers quit.  Download a "quit meter" on your computer to keep track of quit statistics, such as how long you have gone without smoking, cigarettes not smoked, and money saved.  Get a self-help book about quitting smoking and staying off of tobacco. LEARN NEW SKILLS AND BEHAVIORS  Distract yourself from urges to smoke. Talk to someone, go for a walk, or occupy your time with a task.  Change your normal routine. Take a different route to work. Drink tea instead of coffee.  Eat breakfast in a different place.  Reduce your stress. Take a hot bath, exercise, or read a book.  Plan something enjoyable to do every day. Reward yourself for not smoking.  Explore interactive web-based programs that specialize in helping you quit. GET MEDICINE AND USE IT CORRECTLY Medicines can help you stop smoking and decrease the urge to smoke. Combining medicine with the above behavioral methods and support can greatly increase your chances of successfully quitting smoking.  Nicotine replacement therapy helps deliver nicotine to your body without the negative effects and risks of smoking. Nicotine replacement therapy includes nicotine gum, lozenges, inhalers, nasal sprays, and skin patches. Some may be available over-the-counter and others require a prescription.  Antidepressant medicine helps people abstain from smoking, but how this works is unknown. This medicine is available by prescription.  Nicotinic receptor partial agonist medicine simulates the effect of nicotine in your brain. This medicine is available by prescription. Ask your caregiver for advice about which medicines to use and how to use them based on your health history. Your caregiver will tell you what side effects to look out for if you choose to be on a medicine or therapy. Carefully read the information on the package. Do not use any other product containing nicotine while using a nicotine replacement product.  RELAPSE OR DIFFICULT SITUATIONS Most relapses occur within the first 3 months after quitting. Do not be discouraged if you start smoking again. Remember, most people try several times before finally quitting. You may have symptoms of withdrawal because your body is used to nicotine. You may crave cigarettes, be irritable, feel very hungry, cough often, get headaches, or have difficulty concentrating. The withdrawal symptoms are only temporary. They are strongest when you first quit, but they will go away within  10 14 days. To reduce the chances of relapse, try to:  Avoid drinking alcohol. Drinking lowers your chances of successfully quitting.  Reduce the amount of caffeine you consume. Once you quit smoking, the amount of caffeine in your body increases and can give you symptoms, such as a rapid heartbeat, sweating, and anxiety.  Avoid smokers because they can make you want to smoke.  Do not let weight gain distract you. Many smokers will gain weight when they quit, usually less than 10 pounds. Eat a healthy diet and stay active. You can always lose the weight gained after you quit.  Find ways to improve your mood other than smoking. FOR MORE INFORMATION  www.smokefree.gov  Document Released: 10/07/2001 Document Revised: 04/13/2012 Document Reviewed: 01/22/2012 [facility] Patient Information 2014 [place], [place]. Smoking Cessation, Tips for Success YOU CAN QUIT SMOKING If you are ready to quit smoking, congratulations! You have chosen to help yourself be healthier. Cigarettes bring nicotine, tar, carbon monoxide, and other irritants into your body. Your lungs, heart, and blood vessels will be able to work better without these poisons. There are many different ways to  quit smoking. Nicotine gum, nicotine patches, a nicotine inhaler, or nicotine nasal spray can help with physical craving. Hypnosis, support groups, and medicines help break the habit of smoking. Here are some tips to help you quit for good.  Throw away all cigarettes.  Clean and remove all ashtrays from your home, work, and car.  On a card, write down your reasons for quitting. Carry the card with you and read it when you get the urge to smoke.  Cleanse your body of nicotine. Drink enough water and fluids to keep your urine clear or pale yellow. Do this after quitting to flush the nicotine from your body.  Learn to predict your moods. Do not let a bad situation be your excuse to have a cigarette. Some situations in your life might  tempt you into wanting a cigarette.  Never have "just one" cigarette. It leads to wanting another and another. Remind yourself of your decision to quit.  Change habits associated with smoking. If you smoked while driving or when feeling stressed, try other activities to replace smoking. Stand up when drinking your coffee. Brush your teeth after eating. Sit in a different chair when you read the paper. Avoid alcohol while trying to quit, and try to drink fewer caffeinated beverages. Alcohol and caffeine may urge you to smoke.  Avoid foods and drinks that can trigger a desire to smoke, such as sugary or spicy foods and alcohol.  Ask people who smoke not to smoke around you.  Have something planned to do right after eating or having a cup of coffee. Take a walk or exercise to perk you up. This will help to keep you from overeating.  Try a relaxation exercise to calm you down and decrease your stress. Remember, you may be tense and nervous for the first 2 weeks after you quit, but this will pass.  Find new activities to keep your hands busy. Play with a pen, coin, or rubber band. Doodle or draw things on paper.  Brush your teeth right after eating. This will help cut down on the craving for the taste of tobacco after meals. You can try mouthwash, too.  Use oral substitutes, such as lemon drops, carrots, a cinnamon stick, or chewing gum, in place of cigarettes. Keep them handy so they are available when you have the urge to smoke.  When you have the urge to smoke, try deep breathing.  Designate your home as a nonsmoking area.  If you are a heavy smoker, ask your caregiver about a prescription for nicotine chewing gum. It can ease your withdrawal from nicotine.  Reward yourself. Set aside the cigarette money you save and buy yourself something nice.  Look for support from others. Join a support group or smoking cessation program. Ask someone at home or at work to help you with your plan to quit  smoking.  Always ask yourself, "Do I need this cigarette or is this just a reflex?" Tell yourself, "Today, I choose not to smoke," or "I do not want to smoke." You are reminding yourself of your decision to quit, even if you do smoke a cigarette. HOW WILL I FEEL WHEN I QUIT SMOKING?  The benefits of not smoking start within days of quitting.  You may have symptoms of withdrawal because your body is used to nicotine (the addictive substance in cigarettes). You may crave cigarettes, be irritable, feel very hungry, cough often, get headaches, or have difficulty concentrating.  The withdrawal symptoms are only temporary.  They are strongest when you first quit but will go away within 10 to 14 days.  When withdrawal symptoms occur, stay in control. Think about your reasons for quitting. Remind yourself that these are signs that your body is healing and getting used to being without cigarettes.  Remember that withdrawal symptoms are easier to treat than the major diseases that smoking can cause.  Even after the withdrawal is over, expect periodic urges to smoke. However, these cravings are generally short-lived and will go away whether you smoke or not. Do not smoke!  If you relapse and smoke again, do not lose hope. Most smokers quit 3 times before they are successful.  If you relapse, do not give up! Plan ahead and think about what you will do the next time you get the urge to smoke. LIFE AS A NONSMOKER: MAKE IT FOR A MONTH, MAKE IT FOR LIFE Day 1: Hang this page where you will see it every day. Day 2: Get rid of all ashtrays, matches, and lighters. Day 3: Drink water. Breathe deeply between sips. Day 4: Avoid places with smoke-filled air, such as bars, clubs, or the smoking section of restaurants. Day 5: Keep track of how much money you save by not smoking. Day 6: Avoid boredom. Keep a good book with you or go to the movies. Day 7: Reward yourself! One week without smoking! Day 8: Make a  dental appointment to get your teeth cleaned. Day 9: Decide how you will turn down a cigarette before it is offered to you. Day 10: Review your reasons for quitting. Day 11: Distract yourself. Stay active to keep your mind off smoking and to relieve tension. Take a walk, exercise, read a book, do a crossword puzzle, or try a new hobby. Day 12: Exercise. Get off the bus before your stop or use stairs instead of escalators. Day 13: Call on friends for support and encouragement. Day 14: Reward yourself! Two weeks without smoking! Day 15: Practice deep breathing exercises. Day 16: Bet a friend that you can stay a nonsmoker. Day 17: Ask to sit in nonsmoking sections of restaurants. Day 18: Hang up "No Smoking" signs. Day 19: Think of yourself as a nonsmoker. Day 20: Each morning, tell yourself you will not smoke. Day 21: Reward yourself! Three weeks without smoking! Day 22: Think of smoking in negative ways. Remember how it stains your teeth, gives you bad breath, and leaves you short of breath. Day 23: Eat a nutritious breakfast. Day 24:Do not relive your days as a smoker. Day 25: Hold a pencil in your hand when talking on the telephone. Day 26: Tell all your friends you do not smoke. Day 27: Think about how much better food tastes. Day 28: Remember, one cigarette is one too many. Day 29: Take up a hobby that will keep your hands busy. Day 30: Congratulations! One month without smoking! Give yourself a big reward. Your caregiver can direct you to community resources or hospitals for support, which may include:  Group support.  Education.  Hypnosis.  Subliminal therapy. Document Released: 07/11/2004 Document Revised: 01/05/2012 Document Reviewed: 07/30/2009 [facility] Patient Information 2014 [place], [place]. Allergic Rhinitis Allergic rhinitis is when the mucous membranes in the nose respond to allergens. Allergens are particles in the air that cause your body to have an allergic  reaction. This causes you to release allergic antibodies. Through a chain of events, these eventually cause you to release histamine into the blood stream (hence the use of antihistamines).  Although meant to be protective to the body, it is this release that causes your discomfort, such as frequent sneezing, congestion and an itchy runny nose.  CAUSES  The pollen allergens may come from grasses, trees, and weeds. This is seasonal allergic rhinitis, or "hay fever." Other allergens cause year-round allergic rhinitis (perennial allergic rhinitis) such as house dust mite allergen, pet dander and mold spores.  SYMPTOMS   Nasal stuffiness (congestion).  Runny, itchy nose with sneezing and tearing of the eyes.  There is often an itching of the mouth, eyes and ears. It cannot be cured, but it can be controlled with medications. DIAGNOSIS  If you are unable to determine the offending allergen, skin or blood testing may find it. TREATMENT   Avoid the allergen.  Medications and allergy shots (immunotherapy) can help.  Hay fever may often be treated with antihistamines in pill or nasal spray forms. Antihistamines block the effects of histamine. There are over-the-counter medicines that may help with nasal congestion and swelling around the eyes. Check with your caregiver before taking or giving this medicine. If the treatment above does not work, there are many new medications your caregiver can prescribe. Stronger medications may be used if initial measures are ineffective. Desensitizing injections can be used if medications and avoidance fails. Desensitization is when a patient is given ongoing shots until the body becomes less sensitive to the allergen. Make sure you follow up with your caregiver if problems continue. SEEK MEDICAL CARE IF:   You develop fever (more than 100.5 F (38.1 C).  You develop a cough that does not stop easily (persistent).  You have shortness of breath.  You start  wheezing.  Symptoms interfere with normal daily activities. Document Released: 07/08/2001 Document Revised: 01/05/2012 Document Reviewed: 01/17/2009 [facility] Patient Information 2014 [place], [place]. Bronchitis Bronchitis is a problem of the air tubes leading to your lungs. This problem makes it hard for air to get in and out of the lungs. You may cough a lot because your air tubes are narrow. Going without care can cause lasting (chronic) bronchitis. HOME CARE   Drink enough fluids to keep your pee (urine) clear or pale yellow.  Use a cool mist humidifier.  Quit smoking if you smoke. If you keep smoking, the bronchitis might not get better.  Only take medicine as told by your doctor. GET HELP RIGHT AWAY IF:   Coughing keeps you awake.  You start to wheeze.  You become more sick or weak.  You have a hard time breathing or get short of breath.  You cough up blood.  Coughing lasts more than 2 weeks.  You have a fever.  Your baby is older than 3 months with a rectal temperature of 102 F (38.9 C) or higher.  Your baby is 79 months old or younger with a rectal temperature of 100.4 F (38 C) or higher. MAKE SURE YOU:  Understand these instructions.  Will watch your condition.  Will get help right away if you are not doing well or get worse. Document Released: 03/31/2008 Document Revised: 01/05/2012 Document Reviewed: 09/14/2009 [facility] Patient Information 2014 [place], [place].

## 2013-07-29 NOTE — H&P (Signed)
SIM CHOQUETTE is an 52 y.o. male.   Chief Complaint:   Left knee pain  HPI: Mr. Caldera is a 52 year old male here to followup for left knee pain he is been experiencing for "years".  The patient was in clinic last month for the same knee pain and received a cortisone injection in his left knee.  Patient states he noticed marginal improvement that same day however notice no difference in his pain the days after.  He also complains of swelling in his left knee which he is only noticed in the past month.  He underwent an MRI of his left knee in July which that time showed mild degenerative chondrosis with no meniscal tear.  At that time MRI showed no joint effusion and showed a small Baker's cyst in the popliteal fossa.  No acute bony findings were noted.  The patient states recently his pain has worsened to the point where it is affecting his sleep and he said last night he did not sleep at all due to his pain.  He has tried his pain medication prescribed for his back pain which has not helped his knee at all.  Past Medical History  Diagnosis Date  . Thyroid nodule   . Anxiety   . GERD (gastroesophageal reflux disease)   . Headache(784.0)     2 x/week  . Arthritis     neck, shoulder, left knee  . Hypertension     under control with med., has been on med. x 1 yr.  . 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 Medial meniscus tear 06/2013    left knee  . Stuffy and runny nose 07/25/2013    clear drainage from nose  . Cough with sputum 07/25/2013    white  . Limited joint range of motion     cervical fusion 02/2013  . Wears partial dentures     upper    Past Surgical History  Procedure Laterality Date  . Appendectomy    . Eye surgery Left     cataract  . Anterior cervical decomp/discectomy fusion N/A 03/18/2013    Procedure: ANTERIOR CERVICAL DECOMPRESSION/DISCECTOMY FUSION 3 LEVELS;  Surgeon: Maeola Harman, MD;  Location: [facility];  Service: Neurosurgery;  Laterality: N/A;  Anterior  Cervical Three-Six  Decompression/Diskectomy/Fusion  . Hemicolectomy Right 04/13/2000    with ileocolic anastomosis  . Small intestine surgery  04/24/2000    adhesiolysis, ileocolic anastomosis  . Circumcision  05/04/2006  . Shoulder arthroscopy w/ rotator cuff repair Left 07/16/2010  . Inguinal hernia repair      History reviewed. No pertinent family history. Social History:  reports that he has been smoking Cigarettes.  He has a 25 pack-year smoking history. He has never used smokeless tobacco. He reports that  drinks alcohol. He reports that he does not use illicit drugs.  Allergies:  Allergies  Allergen Reactions  . Morphine Itching  . Penicillins Rash    No prescriptions prior to admission    No results found for this or any previous visit (from the past 48 hour(s)). No results found.  Review of Systems  Constitutional: Negative.   HENT: Negative.   Eyes: Negative.   Respiratory: Negative.   Cardiovascular: Negative.   Gastrointestinal: Negative.   Genitourinary: Negative.   Musculoskeletal: Positive for joint pain.  Skin: Negative.   Neurological: Negative.   Endo/Heme/Allergies: Negative.   Psychiatric/Behavioral: Negative.     Height 5' 10.5" (1.791 m), weight 86.637 kg (191 lb).  Physical Exam  Constitutional: He is oriented to person, place, and time. He appears well-developed and well-nourished.  HENT:  Head: Normocephalic and atraumatic.  Eyes: Pupils are equal, round, and reactive to light.  Neck: Normal range of motion. Neck supple.  Cardiovascular: Intact distal pulses.   Respiratory: Effort normal and breath sounds normal.  Musculoskeletal: He exhibits tenderness (left knee pain).  Neurological: He is alert and oriented to person, place, and time. He has normal reflexes.  Skin: Skin is warm and dry.  Psychiatric: He has a normal mood and affect. His behavior is normal. Judgment and thought content normal.     Assessment/Plan Assess: Left knee  pain/effusion due to possible chondromalacia.  Plan: Due to the fact that patient's last cortisone in his left knee only help marginally, patient's pain is suspicious for possible chondromalacia in the joint.  Today we spoke to Mr. Godlewski about his options and since he is failed conservative therapy, his next most helpful step would be an arthroscopic debridement.  We have provided Mr. Steagall with an information sheet regarding this procedure and he decided he would go home and think about whether or not this to be the best option for him.  He would like Mr. Kloosterman to call us to schedule this procedure should he decide to proceed with it.  We encourage Mr. Brashier to call or return to clinic should he have any other questions or concerns.  Lavern Crimi R 07/29/2013, 3:10 PM

## 2013-08-01 ENCOUNTER — Encounter (HOSPITAL_BASED_OUTPATIENT_CLINIC_OR_DEPARTMENT_OTHER)
Admission: RE | Disposition: A | Discharge status: Home / Self Care | Payer: Self-pay | Source: Ambulatory Visit | Attending: Orthopedic Surgery

## 2013-08-01 ENCOUNTER — Ambulatory Visit (HOSPITAL_BASED_OUTPATIENT_CLINIC_OR_DEPARTMENT_OTHER)
Admission: RE | Admit: 2013-08-01 | Discharge: 2013-08-01 | Disposition: A | Discharge status: Home / Self Care | Payer: Medicaid Other | Source: Ambulatory Visit | Attending: Orthopedic Surgery | Admitting: Orthopedic Surgery

## 2013-08-01 ENCOUNTER — Encounter (HOSPITAL_BASED_OUTPATIENT_CLINIC_OR_DEPARTMENT_OTHER): Payer: Self-pay | Admitting: Anesthesiology

## 2013-08-01 ENCOUNTER — Encounter (HOSPITAL_BASED_OUTPATIENT_CLINIC_OR_DEPARTMENT_OTHER): Payer: Self-pay

## 2013-08-01 ENCOUNTER — Ambulatory Visit (HOSPITAL_BASED_OUTPATIENT_CLINIC_OR_DEPARTMENT_OTHER): Payer: Medicaid Other | Admitting: Anesthesiology

## 2013-08-01 DIAGNOSIS — K219 Gastro-esophageal reflux disease without esophagitis: Secondary | ICD-10-CM | POA: Insufficient documentation

## 2013-08-01 DIAGNOSIS — X58XXXS Exposure to other specified factors, sequela: Secondary | ICD-10-CM | POA: Insufficient documentation

## 2013-08-01 DIAGNOSIS — S83207A Unspecified tear of unspecified meniscus, current injury, left knee, initial encounter: Secondary | ICD-10-CM

## 2013-08-01 DIAGNOSIS — M23359 Other meniscus derangements, posterior horn of lateral meniscus, unspecified knee: Secondary | ICD-10-CM | POA: Insufficient documentation

## 2013-08-01 DIAGNOSIS — M2340 Loose body in knee, unspecified knee: Secondary | ICD-10-CM | POA: Insufficient documentation

## 2013-08-01 DIAGNOSIS — IMO0001 Reserved for inherently not codable concepts without codable children: Secondary | ICD-10-CM | POA: Insufficient documentation

## 2013-08-01 DIAGNOSIS — F17200 Nicotine dependence, unspecified, uncomplicated: Secondary | ICD-10-CM | POA: Insufficient documentation

## 2013-08-01 DIAGNOSIS — M2240 Chondromalacia patellae, unspecified knee: Secondary | ICD-10-CM | POA: Insufficient documentation

## 2013-08-01 DIAGNOSIS — I10 Essential (primary) hypertension: Secondary | ICD-10-CM | POA: Insufficient documentation

## 2013-08-01 HISTORY — DX: Other tear of medial meniscus, current injury, unspecified knee, initial encounter: S83.249A

## 2013-08-01 HISTORY — DX: Presence of dental prosthetic device (complete) (partial): Z97.2

## 2013-08-01 HISTORY — DX: Stiffness of unspecified joint, not elsewhere classified: M25.60

## 2013-08-01 HISTORY — DX: Chondromalacia, unspecified knee: M94.269

## 2013-08-01 HISTORY — DX: Unspecified osteoarthritis, unspecified site: M19.90

## 2013-08-01 HISTORY — PX: KNEE ARTHROSCOPY: SHX127

## 2013-08-01 LAB — POCT HEMOGLOBIN-HEMACUE: Hemoglobin: 12.4 g/dL — ABNORMAL LOW (ref 13.0–17.0)

## 2013-08-01 SURGERY — ARTHROSCOPY, KNEE
Anesthesia: General | Site: Knee | Laterality: Left | Wound class: Clean

## 2013-08-01 MED ORDER — DEXAMETHASONE SODIUM PHOSPHATE 4 MG/ML IJ SOLN
INTRAMUSCULAR | Status: DC | PRN
  Administered 2013-08-01: 10 mg via INTRAVENOUS

## 2013-08-01 MED ORDER — FENTANYL CITRATE 0.05 MG/ML IJ SOLN
50.0000 ug | INTRAMUSCULAR | Status: AC | PRN
  Administered 2013-08-01 (×2): 50 ug via INTRAVENOUS

## 2013-08-01 MED ORDER — ONDANSETRON HCL 4 MG/2ML IJ SOLN
INTRAMUSCULAR | Status: DC | PRN
  Administered 2013-08-01: 4 mg via INTRAMUSCULAR

## 2013-08-01 MED ORDER — OXYCODONE HCL 5 MG PO TABS
5.0000 mg | ORAL_TABLET | Freq: Once | ORAL | Status: AC | PRN
  Administered 2013-08-01: 5 mg via ORAL

## 2013-08-01 MED ORDER — HYDROMORPHONE HCL PF 1 MG/ML IJ SOLN
0.2500 mg | INTRAMUSCULAR | Status: DC | PRN
  Administered 2013-08-01 (×2): 0.5 mg via INTRAVENOUS

## 2013-08-01 MED ORDER — OXYCODONE HCL 5 MG/5ML PO SOLN: 5.0000 mg | Freq: Once | ORAL | Status: AC | PRN

## 2013-08-01 MED ORDER — OXYCODONE-ACETAMINOPHEN 5-325 MG PO TABS: 1.0000 | ORAL_TABLET | ORAL | Status: DC | PRN

## 2013-08-01 MED ORDER — PROMETHAZINE HCL 25 MG/ML IJ SOLN: 6.2500 mg | INTRAMUSCULAR | Status: DC | PRN

## 2013-08-01 MED ORDER — FENTANYL CITRATE 0.05 MG/ML IJ SOLN: 50.0000 ug | INTRAMUSCULAR | Status: DC | PRN

## 2013-08-01 MED ORDER — VANCOMYCIN HCL IN DEXTROSE 1-5 GM/200ML-% IV SOLN: 1000.0000 mg | INTRAVENOUS | Status: DC

## 2013-08-01 MED ORDER — BUPIVACAINE-EPINEPHRINE 0.5% -1:200000 IJ SOLN
INTRAMUSCULAR | Status: DC | PRN
  Administered 2013-08-01: 20 mL

## 2013-08-01 MED ORDER — LIDOCAINE HCL (CARDIAC) 20 MG/ML IV SOLN
INTRAVENOUS | Status: DC | PRN
  Administered 2013-08-01: 100 mg via INTRAVENOUS

## 2013-08-01 MED ORDER — LACTATED RINGERS IV SOLN
INTRAVENOUS | Status: DC
  Administered 2013-08-01: 20 mL/h via INTRAVENOUS
  Administered 2013-08-01: 12:00:00 via INTRAVENOUS

## 2013-08-01 MED ORDER — CHLORHEXIDINE GLUCONATE 4 % EX LIQD: 60.0000 mL | Freq: Once | CUTANEOUS | Status: DC

## 2013-08-01 MED ORDER — MIDAZOLAM HCL 2 MG/ML PO SYRP: 12.0000 mg | ORAL_SOLUTION | Freq: Once | ORAL | Status: DC | PRN

## 2013-08-01 MED ORDER — PROPOFOL 10 MG/ML IV BOLUS
INTRAVENOUS | Status: DC | PRN
  Administered 2013-08-01: 200 mg via INTRAVENOUS

## 2013-08-01 MED ORDER — FENTANYL CITRATE 0.05 MG/ML IJ SOLN
INTRAMUSCULAR | Status: DC | PRN
  Administered 2013-08-01: 50 ug via INTRAVENOUS
  Administered 2013-08-01 (×2): 25 ug via INTRAVENOUS
  Administered 2013-08-01: 100 ug via INTRAVENOUS

## 2013-08-01 MED ORDER — DEXTROSE-NACL 5-0.45 % IV SOLN: INTRAVENOUS | Status: DC

## 2013-08-01 MED ORDER — EPINEPHRINE HCL 1 MG/ML IJ SOLN
INTRAMUSCULAR | Status: DC | PRN
  Administered 2013-08-01: 2 mL

## 2013-08-01 MED ORDER — MIDAZOLAM HCL 2 MG/2ML IJ SOLN: 1.0000 mg | INTRAMUSCULAR | Status: DC | PRN

## 2013-08-01 MED ORDER — SODIUM CHLORIDE 0.9 % IR SOLN
Status: DC | PRN
  Administered 2013-08-01: 6000 mL

## 2013-08-01 MED ORDER — MIDAZOLAM HCL 5 MG/5ML IJ SOLN
INTRAMUSCULAR | Status: DC | PRN
  Administered 2013-08-01: 2 mg via INTRAVENOUS

## 2013-08-01 SURGICAL SUPPLY — 41 items
BANDAGE ELASTIC 6 VELCRO ST LF (GAUZE/BANDAGES/DRESSINGS) IMPLANT
BLADE 4.2CUDA (BLADE) IMPLANT
BLADE CUTTER GATOR 3.5 (BLADE) ×2 IMPLANT
BLADE GREAT WHITE 4.2 (BLADE) IMPLANT
CANISTER OMNI JUG 16 LITER (MISCELLANEOUS) ×2 IMPLANT
CANISTER SUCTION 2500CC (MISCELLANEOUS) IMPLANT
CHLORAPREP W/TINT 26ML (MISCELLANEOUS) ×2 IMPLANT
CLOTH BEACON ORANGE TIMEOUT ST (SAFETY) ×2 IMPLANT
DRAPE ARTHROSCOPY W/POUCH 114 (DRAPES) ×2 IMPLANT
ELECT MENISCUS 165MM 90D (ELECTRODE) IMPLANT
ELECT REM PT RETURN 9FT ADLT (ELECTROSURGICAL) ×2
ELECTRODE REM PT RTRN 9FT ADLT (ELECTROSURGICAL) ×1 IMPLANT
GAUZE XEROFORM 1X8 LF (GAUZE/BANDAGES/DRESSINGS) ×2 IMPLANT
GLOVE BIO SURGEON STRL SZ7.5 (GLOVE) ×2 IMPLANT
GLOVE BIO SURGEON STRL SZ8.5 (GLOVE) ×2 IMPLANT
GLOVE BIOGEL PI IND STRL 7.0 (GLOVE) ×2 IMPLANT
GLOVE BIOGEL PI IND STRL 8 (GLOVE) ×1 IMPLANT
GLOVE BIOGEL PI IND STRL 9 (GLOVE) ×1 IMPLANT
GLOVE BIOGEL PI INDICATOR 7.0 (GLOVE) ×2
GLOVE BIOGEL PI INDICATOR 8 (GLOVE) ×1
GLOVE BIOGEL PI INDICATOR 9 (GLOVE) ×1
GLOVE ECLIPSE 6.5 STRL STRAW (GLOVE) ×2 IMPLANT
GOWN PREVENTION PLUS XLARGE (GOWN DISPOSABLE) ×2 IMPLANT
GOWN PREVENTION PLUS XXLARGE (GOWN DISPOSABLE) ×2 IMPLANT
IV NS IRRIG 3000ML ARTHROMATIC (IV SOLUTION) ×4 IMPLANT
KNEE WRAP E Z 3 GEL PACK (MISCELLANEOUS) ×2 IMPLANT
NDL SAFETY ECLIPSE 18X1.5 (NEEDLE) ×1 IMPLANT
NEEDLE HYPO 18GX1.5 SHARP (NEEDLE) ×1
PACK ARTHROSCOPY DSU (CUSTOM PROCEDURE TRAY) ×2 IMPLANT
PACK BASIN DAY SURGERY FS (CUSTOM PROCEDURE TRAY) ×2 IMPLANT
PAD ALCOHOL SWAB (MISCELLANEOUS) ×2 IMPLANT
PENCIL BUTTON HOLSTER BLD 10FT (ELECTRODE) IMPLANT
SET ARTHROSCOPY TUBING (MISCELLANEOUS) ×1
SET ARTHROSCOPY TUBING LN (MISCELLANEOUS) ×1 IMPLANT
SLEEVE SCD COMPRESS KNEE MED (MISCELLANEOUS) IMPLANT
SPONGE GAUZE 4X4 12PLY (GAUZE/BANDAGES/DRESSINGS) ×2 IMPLANT
SYR 3ML 18GX1 1/2 (SYRINGE) IMPLANT
SYR 5ML LL (SYRINGE) ×2 IMPLANT
TOWEL OR 17X24 6PK STRL BLUE (TOWEL DISPOSABLE) ×2 IMPLANT
WAND STAR VAC 90 (SURGICAL WAND) IMPLANT
WATER STERILE IRR 1000ML POUR (IV SOLUTION) ×2 IMPLANT

## 2013-08-01 NOTE — Progress Notes (Signed)
Pt C/O pain in knee,level 7/10.  Dr Jacklynn Bue notified, orders received %14mcg Fentanyl given IVP, pt on monitor and O2 per simple mask Family with pt

## 2013-08-01 NOTE — Anesthesia Postprocedure Evaluation (Signed)
Anesthesia Post Note  Patient: [name] [name]  Procedure(s) Performed: Procedure(s) (LRB): LEFT ARTHROSCOPY KNEE (Left)  Anesthesia type: general  Patient location: PACU  Post pain: Pain level controlled  Post assessment: Patient's Cardiovascular Status Stable  Last Vitals:  Filed Vitals:   08/01/13 1415  BP: 126/97  Pulse: 75  Temp:   Resp: 17    Post vital signs: Reviewed and stable  Level of consciousness: sedated  Complications: No apparent anesthesia complications

## 2013-08-01 NOTE — Op Note (Signed)
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 Turner Daniels M.D.  Assist: Tomi Likens. Gaylene Brooks  (present throughout entire procedure and necessary for timely completion of the procedure) Anes: General LMA  EBL: Minimal  Fluids: 800 cc   Indications: Patient has catching popping and pain in his left knee status post traumatic accident. Plain x-rays and MRI scan are nondiagnostic but he had good temporarily from a cortisone injection in his taken for arthroscopic evaluation treatment of his knee. Pt has failed conservative treatment with anti-inflammatory medicines, physical therapy, and modified activites but did get good temporarily from an intra-articular cortisone injection. Pain has recurred and patient desires elective arthroscopic evaluation and treatment of knee. Risks and benefits of surgery have been discussed and questions answered.  Procedure: Patient identified by arm band and taken to the operating room at the day [facility]. The appropriate anesthetic monitors were attached, and General LMA anesthesia was induced without difficulty. Lateral post was applied to the table and the lower extremity was prepped and draped in usual sterile fashion from the ankle to the midthigh. Time out procedure was performed. We began the operation by making standard inferior lateral and inferior medial peripatellar portals with a #11 blade allowing introduction of the arthroscope through the inferior lateral portal and the out flow to the inferior medial portal. Pump pressure was set at 100 mmHg and diagnostic arthroscopy  revealed a normal patella, grade 3 chondromalacia  the trochlea large flap tears her bradycardia back to a stable margin with a 3.5 mm Gator sucker shaver. Also multiple cartilaginous loose bodies throughout the knee there were taken primarily through the sucker shaver as well as an outflow cannula. The medial meniscus was intact as was the articular cartilage to the medial tibial plateau there is grade 3 chondromalacia small flap tears to the medial femoral condyle near the notch posteriorly, this was debrided. The anterior cruciate ligament and peace are intact. On the lateral side the patient did have a small radial tear the lateral meniscus posterior lateral it was debrided involving the inner quarter of the meniscus. We also found some more loose bodies in the lateral gutter. These were taken with 35 Gator sucker shaver.. The knee was irrigated out normal saline solution. A dressing of xerofoam 4 x 4 dressing sponges, web roll and an Ace wrap was applied. The patient was awakened extubated and taken to the recovery without difficulty.    Signed: Nestor Lewandowsky, MD

## 2013-08-01 NOTE — Transfer of Care (Signed)
Immediate Anesthesia Transfer of Care Note  Patient: [name] [name]  Procedure(s) Performed: Procedure(s): LEFT ARTHROSCOPY KNEE (Left)  Patient Location: PACU  Anesthesia Type:General  Level of Consciousness: sedated  Airway & Oxygen Therapy: Patient Spontanous Breathing and Patient connected to face mask oxygen  Post-op Assessment: Report given to PACU RN and Post -op Vital signs reviewed and stable  Post vital signs: Reviewed and stable  Complications: No apparent anesthesia complications

## 2013-08-01 NOTE — Interval H&P Note (Signed)
History and Physical Interval Note:  08/01/2013 12:24 PM  [name] [name]  has presented today for surgery, with the diagnosis of LEFT KNEE CHONDROMALACIA/MEDIAL MENISCUS TEAR  The various methods of treatment have been discussed with the patient and family. After consideration of risks, benefits and other options for treatment, the patient has consented to  Procedure(s): LEFT ARTHROSCOPY KNEE (Left) as a surgical intervention .  The patient's history has been reviewed, patient examined, no change in status, stable for surgery.  I have reviewed the patient's chart and labs.  Questions were answered to the patient's satisfaction.     Nestor Lewandowsky

## 2013-08-01 NOTE — Anesthesia Procedure Notes (Addendum)
Performed by: Gar Gibbon   Procedure Name: LMA Insertion Date/Time: 08/01/2013 12:39 PM Performed by: Gar Gibbon Pre-anesthesia Checklist: Patient identified, Emergency Drugs available, Suction available and Patient being monitored Patient Re-evaluated:Patient Re-evaluated prior to inductionOxygen Delivery Method: Circle System Utilized Preoxygenation: Pre-oxygenation with 100% oxygen Intubation Type: IV induction Ventilation: Mask ventilation without difficulty LMA: LMA inserted LMA Size: 5.0 Number of attempts: 1 Airway Equipment and Method: bite block Placement Confirmation: positive ETCO2 Tube secured with: Tape Dental Injury: Teeth and Oropharynx as per pre-operative assessment

## 2013-08-01 NOTE — Anesthesia Preprocedure Evaluation (Addendum)
Anesthesia Evaluation  Patient identified by MRN, date of birth, ID band Patient awake    Reviewed: Allergy & Precautions, H&P , NPO status , Patient's Chart, lab work & pertinent test results  History of Anesthesia Complications Negative for: history of anesthetic complications  Airway Mallampati: II  Neck ROM: full    Dental  (+) Teeth Intact   Pulmonary Current Smoker,  Has cough , on antiibiotics breath sounds clear to auscultation        Cardiovascular hypertension, Rhythm:Regular Rate:Normal     Neuro/Psych  Headaches,    GI/Hepatic GERD-  ,  Endo/Other    Renal/GU      Musculoskeletal   Abdominal   Peds  Hematology   Anesthesia Other Findings   Reproductive/Obstetrics                         Anesthesia Physical Anesthesia Plan  ASA: II  Anesthesia Plan: General   Post-op Pain Management:    Induction: Intravenous  Airway Management Planned: LMA  Additional Equipment:   Intra-op Plan:   Post-operative Plan: Extubation in OR  Informed Consent: I have reviewed the patients History and Physical, chart, labs and discussed the procedure including the risks, benefits and alternatives for the proposed anesthesia with the patient or authorized representative who has indicated his/her understanding and acceptance.   Dental advisory given  Plan Discussed with: CRNA and Surgeon  Anesthesia Plan Comments:         Anesthesia Quick Evaluation

## 2013-08-02 ENCOUNTER — Encounter (HOSPITAL_BASED_OUTPATIENT_CLINIC_OR_DEPARTMENT_OTHER): Payer: Self-pay | Admitting: Orthopedic Surgery

## 2013-08-05 ENCOUNTER — Ambulatory Visit: Payer: Medicaid Other

## 2013-08-11 ENCOUNTER — Ambulatory Visit: Payer: Medicaid Other

## 2013-08-18 ENCOUNTER — Ambulatory Visit: Payer: Medicaid Other | Attending: Orthopedic Surgery | Admitting: Physical Therapy

## 2013-08-18 DIAGNOSIS — M25569 Pain in unspecified knee: Secondary | ICD-10-CM | POA: Insufficient documentation

## 2013-08-18 DIAGNOSIS — R269 Unspecified abnormalities of gait and mobility: Secondary | ICD-10-CM | POA: Insufficient documentation

## 2013-08-18 DIAGNOSIS — M6281 Muscle weakness (generalized): Secondary | ICD-10-CM | POA: Insufficient documentation

## 2013-08-18 DIAGNOSIS — R5381 Other malaise: Secondary | ICD-10-CM | POA: Insufficient documentation

## 2013-08-18 DIAGNOSIS — IMO0001 Reserved for inherently not codable concepts without codable children: Secondary | ICD-10-CM | POA: Insufficient documentation

## 2013-08-25 ENCOUNTER — Emergency Department (HOSPITAL_COMMUNITY)
Admission: EM | Admit: 2013-08-25 | Discharge: 2013-08-25 | Disposition: A | Discharge status: Home / Self Care | Payer: Medicaid Other | Attending: Emergency Medicine | Admitting: Emergency Medicine

## 2013-08-25 ENCOUNTER — Emergency Department (HOSPITAL_COMMUNITY): Payer: Medicaid Other

## 2013-08-25 ENCOUNTER — Encounter (HOSPITAL_COMMUNITY): Payer: Self-pay | Admitting: Emergency Medicine

## 2013-08-25 DIAGNOSIS — Z880 Allergy status to penicillin: Secondary | ICD-10-CM | POA: Insufficient documentation

## 2013-08-25 DIAGNOSIS — I10 Essential (primary) hypertension: Secondary | ICD-10-CM | POA: Insufficient documentation

## 2013-08-25 DIAGNOSIS — Z860100 Personal history of colon polyps, unspecified: Secondary | ICD-10-CM | POA: Insufficient documentation

## 2013-08-25 DIAGNOSIS — F17200 Nicotine dependence, unspecified, uncomplicated: Secondary | ICD-10-CM | POA: Insufficient documentation

## 2013-08-25 DIAGNOSIS — Z9089 Acquired absence of other organs: Secondary | ICD-10-CM | POA: Insufficient documentation

## 2013-08-25 DIAGNOSIS — Z87828 Personal history of other (healed) physical injury and trauma: Secondary | ICD-10-CM | POA: Insufficient documentation

## 2013-08-25 DIAGNOSIS — F411 Generalized anxiety disorder: Secondary | ICD-10-CM | POA: Insufficient documentation

## 2013-08-25 DIAGNOSIS — D649 Anemia, unspecified: Secondary | ICD-10-CM | POA: Insufficient documentation

## 2013-08-25 DIAGNOSIS — K219 Gastro-esophageal reflux disease without esophagitis: Secondary | ICD-10-CM | POA: Insufficient documentation

## 2013-08-25 DIAGNOSIS — R109 Unspecified abdominal pain: Secondary | ICD-10-CM

## 2013-08-25 DIAGNOSIS — Z8739 Personal history of other diseases of the musculoskeletal system and connective tissue: Secondary | ICD-10-CM | POA: Insufficient documentation

## 2013-08-25 DIAGNOSIS — Z98811 Dental restoration status: Secondary | ICD-10-CM | POA: Insufficient documentation

## 2013-08-25 DIAGNOSIS — M129 Arthropathy, unspecified: Secondary | ICD-10-CM | POA: Insufficient documentation

## 2013-08-25 DIAGNOSIS — K2970 Gastritis, unspecified, without bleeding: Secondary | ICD-10-CM

## 2013-08-25 DIAGNOSIS — K5289 Other specified noninfective gastroenteritis and colitis: Secondary | ICD-10-CM | POA: Insufficient documentation

## 2013-08-25 DIAGNOSIS — R1013 Epigastric pain: Secondary | ICD-10-CM | POA: Insufficient documentation

## 2013-08-25 LAB — URINALYSIS, ROUTINE W REFLEX MICROSCOPIC
Bilirubin Urine: NEGATIVE
Ketones, ur: NEGATIVE mg/dL
Leukocytes, UA: NEGATIVE
Nitrite: NEGATIVE
Protein, ur: NEGATIVE mg/dL
pH: 6.5 (ref 5.0–8.0)

## 2013-08-25 LAB — CBC WITH DIFFERENTIAL/PLATELET
Basophils Relative: 0 % (ref 0–1)
Eosinophils Relative: 0 % (ref 0–5)
HCT: 36.3 % — ABNORMAL LOW (ref 39.0–52.0)
Hemoglobin: 12.1 g/dL — ABNORMAL LOW (ref 13.0–17.0)
Lymphocytes Relative: 17 % (ref 12–46)
Lymphs Abs: 2.3 10*3/uL (ref 0.7–4.0)
MCHC: 33.3 g/dL (ref 30.0–36.0)
MCV: 75.6 fL — ABNORMAL LOW (ref 78.0–100.0)
Monocytes Absolute: 1.1 10*3/uL — ABNORMAL HIGH (ref 0.1–1.0)
Monocytes Relative: 8 % (ref 3–12)
Neutro Abs: 10 10*3/uL — ABNORMAL HIGH (ref 1.7–7.7)
Neutrophils Relative %: 75 % (ref 43–77)
RBC: 4.8 MIL/uL (ref 4.22–5.81)

## 2013-08-25 LAB — COMPREHENSIVE METABOLIC PANEL
Albumin: 3.8 g/dL (ref 3.5–5.2)
Alkaline Phosphatase: 86 U/L (ref 39–117)
BUN: 10 mg/dL (ref 6–23)
CO2: 26 mEq/L (ref 19–32)
Chloride: 98 mEq/L (ref 96–112)
Creatinine, Ser: 0.84 mg/dL (ref 0.50–1.35)
GFR calc non Af Amer: >90 mL/min (ref >90)
Glucose, Bld: 97 mg/dL (ref 70–99)
Potassium: 3.8 mEq/L (ref 3.5–5.1)
Total Bilirubin: 0.3 mg/dL (ref 0.3–1.2)

## 2013-08-25 LAB — LIPASE, BLOOD: Lipase: 39 U/L (ref 11–59)

## 2013-08-25 MED ORDER — ONDANSETRON HCL 4 MG/2ML IJ SOLN
4.0000 mg | Freq: Once | INTRAMUSCULAR | Status: AC
  Administered 2013-08-25: 4 mg via INTRAVENOUS
  Filled 2013-08-25: qty 2

## 2013-08-25 MED ORDER — SODIUM CHLORIDE 0.9 % IV BOLUS (SEPSIS)
1000.0000 mL | Freq: Once | INTRAVENOUS | Status: AC
  Administered 2013-08-25: 1000 mL via INTRAVENOUS

## 2013-08-25 MED ORDER — HYDROMORPHONE HCL PF 2 MG/ML IJ SOLN
2.0000 mg | Freq: Once | INTRAMUSCULAR | Status: AC
  Administered 2013-08-25: 2 mg via INTRAVENOUS
  Filled 2013-08-25: qty 1

## 2013-08-25 MED ORDER — IOHEXOL 300 MG/ML  SOLN
25.0000 mL | INTRAMUSCULAR | Status: AC
  Administered 2013-08-25: 25 mL via ORAL

## 2013-08-25 MED ORDER — FAMOTIDINE IN NACL 20-0.9 MG/50ML-% IV SOLN
20.0000 mg | Freq: Once | INTRAVENOUS | Status: AC
  Administered 2013-08-25: 20 mg via INTRAVENOUS
  Filled 2013-08-25: qty 50

## 2013-08-25 MED ORDER — HYDROMORPHONE HCL PF 1 MG/ML IJ SOLN
1.0000 mg | Freq: Once | INTRAMUSCULAR | Status: AC
  Administered 2013-08-25: 1 mg via INTRAVENOUS
  Filled 2013-08-25: qty 1

## 2013-08-25 MED ORDER — IOHEXOL 300 MG/ML  SOLN
100.0000 mL | Freq: Once | INTRAMUSCULAR | Status: AC | PRN
  Administered 2013-08-25: 100 mL via INTRAVENOUS

## 2013-08-25 MED ORDER — OXYCODONE-ACETAMINOPHEN 10-325 MG PO TABS: 1.0000 | ORAL_TABLET | Freq: Four times a day (QID) | ORAL | Status: DC | PRN

## 2013-08-25 NOTE — ED Provider Notes (Signed)
CSN: [id number]     Arrival date & time 08/25/13  1227 History   First MD Initiated Contact with Patient 08/25/13 1401     No chief complaint on file.  (Consider location/radiation/quality/duration/timing/severity/associated sxs/prior Treatment) HPI  [name] [name] is a 52 y.o. male complaining of acute onset of severe, colicky epigastric pain, radiating to the back, starting  at 5 AM this morning, woke him from sleep. Patient is nauseous but there has been no vomiting. He had a single normally formed bowel movement after the onset of pain. He states it was dark, however he is taking iron pills for anemia. Patient states that since that time he has not had any bowel movement or passed any gas. Patient is status post appendectomy with hemicolectomy x2 for polyp removal around 2000. He states that he has had SBO in the past states this feels similar. Patient denies fever, chest pain, shortness of breath. Patient has been taking NSAIDs for headache, proximally 600 mg one time daily for the last several days, no significant alcohol use.  Past Medical History  Diagnosis Date  . Thyroid nodule   . Anxiety   . GERD (gastroesophageal reflux disease)   . Headache(784.0)     2 x/week  . Arthritis     neck, shoulder, left knee  . Hypertension     under control with med., has been on med. x 1 yr.  . 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 Medial meniscus tear 06/2013    left knee  . Stuffy and runny nose 07/25/2013    clear drainage from nose  . Cough with sputum 07/25/2013    white  . Limited joint range of motion     cervical fusion 02/2013  . Wears partial dentures     upper   Past Surgical History  Procedure Laterality Date  . Appendectomy    . Eye surgery Left     cataract  . Anterior cervical decomp/discectomy fusion N/A 03/18/2013    Procedure: ANTERIOR CERVICAL DECOMPRESSION/DISCECTOMY FUSION 3 LEVELS;  Surgeon: Maeola Harman, MD;  Location: [facility];  Service: Neurosurgery;   Laterality: N/A;  Anterior Cervical Three-Six  Decompression/Diskectomy/Fusion  . Hemicolectomy Right 04/13/2000    with ileocolic anastomosis  . Small intestine surgery  04/24/2000    adhesiolysis, ileocolic anastomosis  . Circumcision  05/04/2006  . Shoulder arthroscopy w/ rotator cuff repair Left 07/16/2010  . Inguinal hernia repair    . Knee arthroscopy Left 08/01/2013    Procedure: LEFT ARTHROSCOPY KNEE;  Surgeon: Nestor Lewandowsky, MD;  Location: [facility];  Service: Orthopedics;  Laterality: Left;   History reviewed. No pertinent family history. History  Substance Use Topics  . Smoking status: Current Every Day Smoker -- 1.00 packs/day for 25 years    Types: Cigarettes  . Smokeless tobacco: Never Used  . Alcohol Use: Yes     Comment: occasionally    Review of Systems 10 systems reviewed and found to be negative, except as noted in the HPI   Allergies  Chantix; Morphine; and Penicillins  Home Medications   Current Outpatient Rx  Name  Route  Sig  Dispense  Refill  . albuterol (PROVENTIL HFA;VENTOLIN HFA) 108 (90 BASE) MCG/ACT inhaler   Inhalation   Inhale 2 puffs into the lungs every 4 (four) hours as needed for wheezing or shortness of breath (cough, shortness of breath or wheezing.).   1 Inhaler   1   . atenolol-chlorthalidone (TENORETIC)  100-25 MG per tablet   Oral   Take 1 tablet by mouth daily.   30 tablet   5   . diclofenac sodium (VOLTAREN) 1 % GEL   Topical   Apply 2 g topically 4 (four) times daily as needed.         . ferrous sulfate 325 (65 FE) MG tablet   Oral   Take 325 mg by mouth 2 (two) times daily.         . fluticasone (FLONASE) 50 MCG/ACT nasal spray   Nasal   Place 2 sprays into the nose daily.   16 g   6   . LORazepam (ATIVAN) 1 MG tablet   Oral   Take 1 mg by mouth 2 (two) times daily between meals as needed for anxiety.         . methocarbamol (ROBAXIN) 500 MG tablet   Oral   Take 500 mg by mouth 3 (three) times  daily.         . Multiple Vitamin (MULTIVITAMIN WITH MINERALS) TABS   Oral   Take 1 tablet by mouth daily.         Marland Kitchen omeprazole (PRILOSEC) 40 MG capsule   Oral   Take 1 capsule (40 mg total) by mouth 2 (two) times daily.   60 capsule   5   . oxyCODONE-acetaminophen (PERCOCET) 10-325 MG per tablet   Oral   Take 1-2 tablets by mouth every 6 (six) hours as needed for pain.   90 tablet   0   . sildenafil (VIAGRA) 100 MG tablet   Oral   Take 100 mg by mouth daily as needed for erectile dysfunction.          BP 119/81  Pulse 79  Temp(Src) 98.6 F (37 C) (Oral)  Resp 19  SpO2 96% Physical Exam  Nursing note and vitals reviewed. Constitutional: He is oriented to person, place, and time. He appears well-developed and well-nourished. No distress.  HENT:  Head: Normocephalic and atraumatic.  Mouth/Throat: Oropharynx is clear and moist.  Eyes: Conjunctivae and EOM are normal. Pupils are equal, round, and reactive to light.  Neck: Normal range of motion.  Cardiovascular: Normal rate, regular rhythm and intact distal pulses.   Pulmonary/Chest: Effort normal and breath sounds normal. No stridor. No respiratory distress. He has no wheezes. He has no rales. He exhibits no tenderness.  Abdominal: Soft. He exhibits no distension. There is tenderness. There is no rebound and no guarding.  Tender to deep palpation of the epigastrium with no guarding or rebound.  Musculoskeletal: Normal range of motion. He exhibits no edema.  Neurological: He is alert and oriented to person, place, and time.  Psychiatric: He has a normal mood and affect.    ED Course  Procedures (including critical care time) Labs Review Labs Reviewed  CBC WITH DIFFERENTIAL - Abnormal; Notable for the following:    WBC 13.4 (*)    Hemoglobin 12.1 (*)    HCT 36.3 (*)    MCV 75.6 (*)    MCH 25.2 (*)    RDW 19.5 (*)    Neutro Abs 10.0 (*)    Monocytes Absolute 1.1 (*)    All other components within normal  limits  COMPREHENSIVE METABOLIC PANEL  LIPASE, BLOOD  URINALYSIS, ROUTINE W REFLEX MICROSCOPIC   Imaging Review No results found.  EKG Interpretation   None       MDM  No diagnosis found.   Filed Vitals:  08/25/13 1354 08/25/13 1400 08/25/13 1430 08/25/13 1500  BP: 119/81 110/76 114/76 115/83  Pulse: 79 75 74 70  Temp: 98.6 F (37 C)     TempSrc:      Resp: 19     SpO2: 96% 97% 98% 96%     [name] [name] is a 52 y.o. male with acute onset of epigastric pain, not passing flatus since 5 AM this morning, history of SBO and this feels similar. Patient has had several prior abdominal surgeries including 2 hemicolectomy is for what the patient described as polyp removal. States his surgeon is now retired. Abdominal exam is nonsurgical. Likely SBO. Patient made n.p.o., CT abdomen pelvis pending.   Case signed out to Resident Dr. Craige Cotta at shift change: Plan is to f/u abd CT and if negative d/c Pt with Pain control, advise Pt to DC NSAIDS and f/u with PCP for H pyori testing.   Medications  sodium chloride 0.9 % bolus 1,000 mL (1,000 mLs Intravenous New Bag/Given 08/25/13 1419)  HYDROmorphone (DILAUDID) injection 1 mg (1 mg Intravenous Given 08/25/13 1420)  ondansetron (ZOFRAN) injection 4 mg (4 mg Intravenous Given 08/25/13 1420)  famotidine (PEPCID) IVPB 20 mg (20 mg Intravenous New Bag/Given 08/25/13 1419)  iohexol (OMNIPAQUE) 300 MG/ML solution 25 mL (25 mLs Oral Contrast Given 08/25/13 1422)  HYDROmorphone (DILAUDID) injection 2 mg (2 mg Intravenous Given 08/25/13 1547)    Note: Portions of this report may have been transcribed using voice recognition software. Every effort was made to ensure accuracy; however, inadvertent computerized transcription errors may be present      Wynetta Emery, PA-C 08/25/13 1624

## 2013-08-25 NOTE — ED Provider Notes (Signed)
  Physical Exam  BP 115/83  Pulse 70  Temp(Src) 98.6 F (37 C) (Oral)  Resp 19  SpO2 96%  Physical Exam  ED Course  Procedures  MDM Assumed care from [facility] PA-C at 1600 please see her note for HPI and details of care up until that point.  Briefly this is a 52 y.o. African American male with past medical history of a colon resection small bowel obstructions in the past comes emergency department today with abdominal pain concerning for small bowel extraction. Laboratory evaluation this point has been unremarkable. With significant pain and no flatus or bowel movement awaiting a CT of his abdomen. CT of the abdomen demonstrated gastritis no small bowel obstruction.  Patient does use significant on Motrin home and does have a past history of H. pylori. He was instructed to stop using Motrin for his pain. Is provided with a prescription for Percocet instructed utilizes instead. He was instructed to followup with his primary care physician for evaluation for possible H. pylori infection. Patient expressed understanding. He was discharged in stable condition. Images reviewed by myself and considered and medical decision-making. Imaging was interpreted per radiology. Care was discussed with my attending.   Bethann Berkshire, MD 08/26/13 [phone]

## 2013-08-25 NOTE — ED Provider Notes (Signed)
Medical screening examination/treatment/procedure(s) were conducted as a shared visit with non-physician practitioner(s) and myself.  I personally evaluated the patient during the encounter.  EKG Interpretation   None        Doug Sou, MD 08/25/13 [phone]

## 2013-08-25 NOTE — ED Notes (Signed)
Pt presents with sudden onset of epigastric pain that woke him from sleep at 0500 this morning.  Pt reports h/o pancreatitis and H pylori with symptoms reported consistent with his h pylori.  Pt reports pain is constant with spasms that increase pain to 10/10.  +nausea

## 2013-08-25 NOTE — ED Provider Notes (Signed)
Complains of abdominal pain onset 5 AM today. Pain comes in waves accompanied by by nausea. feels like bowel obstructions he's had in the past. Pain is right-sided and epigastric. On exam alert nontoxic abdomen positive bowel sounds tender over her right upper quadrant right lower quadrants and epigastrium  Doug Sou, MD 08/25/13 1545

## 2013-08-25 NOTE — ED Notes (Signed)
Family at bedside. 

## 2013-08-26 NOTE — ED Provider Notes (Signed)
I have personally seen and examined the patient.  I have discussed the plan of care with the resident.  I have reviewed the documentation on PMH/FH/Soc. History.  I have reviewed the documentation of the resident and agree.  Doug Sou, MD 08/26/13 [phone]

## 2013-09-06 ENCOUNTER — Ambulatory Visit: Payer: Medicaid Other | Attending: Orthopedic Surgery | Admitting: Physical Therapy

## 2013-09-06 DIAGNOSIS — M6281 Muscle weakness (generalized): Secondary | ICD-10-CM | POA: Insufficient documentation

## 2013-09-06 DIAGNOSIS — R269 Unspecified abnormalities of gait and mobility: Secondary | ICD-10-CM | POA: Insufficient documentation

## 2013-09-06 DIAGNOSIS — IMO0001 Reserved for inherently not codable concepts without codable children: Secondary | ICD-10-CM | POA: Insufficient documentation

## 2013-09-06 DIAGNOSIS — M25569 Pain in unspecified knee: Secondary | ICD-10-CM | POA: Insufficient documentation

## 2013-09-06 DIAGNOSIS — R5381 Other malaise: Secondary | ICD-10-CM | POA: Insufficient documentation

## 2013-09-08 ENCOUNTER — Ambulatory Visit: Payer: Medicaid Other | Admitting: Internal Medicine

## 2013-09-08 ENCOUNTER — Encounter: Payer: Medicaid Other | Admitting: Physical Therapy

## 2013-09-12 ENCOUNTER — Telehealth: Payer: Self-pay | Admitting: Internal Medicine

## 2013-09-12 ENCOUNTER — Ambulatory Visit: Payer: Medicaid Other | Admitting: Physical Therapy

## 2013-09-12 NOTE — Telephone Encounter (Signed)
Hearing on 09/27/13 [employer] attorney needs narrative for patient. Please f/u with Dorene Grebe asap to let them know if this is possible.

## 2013-09-14 DIAGNOSIS — G8929 Other chronic pain: Secondary | ICD-10-CM | POA: Insufficient documentation

## 2013-09-14 DIAGNOSIS — M47812 Spondylosis without myelopathy or radiculopathy, cervical region: Secondary | ICD-10-CM | POA: Insufficient documentation

## 2013-09-14 DIAGNOSIS — M5030 Other cervical disc degeneration, unspecified cervical region: Secondary | ICD-10-CM | POA: Insufficient documentation

## 2013-09-20 NOTE — Telephone Encounter (Signed)
Natalie from attorney's office calling again.  She has not received response as to whether this will be possible before 09/27/13.

## 2013-09-21 ENCOUNTER — Encounter: Payer: Self-pay | Admitting: Internal Medicine

## 2013-09-21 ENCOUNTER — Ambulatory Visit: Payer: Medicaid Other | Admitting: Physical Therapy

## 2013-09-21 ENCOUNTER — Other Ambulatory Visit: Payer: Self-pay | Admitting: Internal Medicine

## 2013-09-21 NOTE — Telephone Encounter (Signed)
Spoke with Dorene Grebe from attorney office. Awaiting her to refax paperwork needing signed.

## 2013-09-29 ENCOUNTER — Encounter: Payer: Medicaid Other | Admitting: Physical Therapy

## 2013-10-04 DIAGNOSIS — R519 Headache, unspecified: Secondary | ICD-10-CM | POA: Insufficient documentation

## 2013-10-06 ENCOUNTER — Encounter: Payer: Medicaid Other | Admitting: Physical Therapy

## 2013-10-10 ENCOUNTER — Ambulatory Visit: Payer: Medicaid Other | Attending: Orthopedic Surgery | Admitting: Physical Therapy

## 2013-10-10 DIAGNOSIS — R269 Unspecified abnormalities of gait and mobility: Secondary | ICD-10-CM | POA: Insufficient documentation

## 2013-10-10 DIAGNOSIS — M6281 Muscle weakness (generalized): Secondary | ICD-10-CM | POA: Insufficient documentation

## 2013-10-10 DIAGNOSIS — R5381 Other malaise: Secondary | ICD-10-CM | POA: Insufficient documentation

## 2013-10-10 DIAGNOSIS — M25569 Pain in unspecified knee: Secondary | ICD-10-CM | POA: Insufficient documentation

## 2013-10-10 DIAGNOSIS — IMO0001 Reserved for inherently not codable concepts without codable children: Secondary | ICD-10-CM | POA: Insufficient documentation

## 2013-11-02 ENCOUNTER — Telehealth: Payer: Self-pay | Admitting: Hematology and Oncology

## 2013-11-02 NOTE — Telephone Encounter (Signed)
C/D 11/02/13 for appt. 11/03/13

## 2013-11-03 ENCOUNTER — Encounter: Payer: Self-pay | Admitting: Hematology and Oncology

## 2013-11-03 ENCOUNTER — Ambulatory Visit (HOSPITAL_BASED_OUTPATIENT_CLINIC_OR_DEPARTMENT_OTHER): Payer: Medicaid Other | Admitting: Hematology and Oncology

## 2013-11-03 ENCOUNTER — Ambulatory Visit: Payer: Medicare Other

## 2013-11-03 VITALS — BP 119/89 | HR 63 | Temp 98.1°F | Resp 18 | Ht 70.0 in | Wt 192.3 lb

## 2013-11-03 DIAGNOSIS — D72829 Elevated white blood cell count, unspecified: Secondary | ICD-10-CM

## 2013-11-03 DIAGNOSIS — F17200 Nicotine dependence, unspecified, uncomplicated: Secondary | ICD-10-CM

## 2013-11-03 HISTORY — DX: Elevated white blood cell count, unspecified: D72.829

## 2013-11-03 NOTE — Progress Notes (Signed)
[facility] NOTE  Patient Care Team: Darrick Penna, PA-C as PCP - General (Physician Assistant) Heath Lark, MD as Consulting Physician (Hematology and Oncology)  CHIEF COMPLAINTS/PURPOSE OF CONSULTATION:  Chronic leukocytosis  HISTORY OF PRESENTING ILLNESS:  [name] [name] 53 y.o. male is here because of elevated WBC.  He was found to have abnormal CBC from 2009. From 2009 to present, he has chronic fluctuation of white blood cell count between normal limits to 15.9. He has history of recurrent bronchitis. The last prescription antibiotics was less than 3 months ago from recurrent bronchitis At present time, he reports symptoms of productive cough with yellow phlegm on a daily basis. He denies urinary frequency/urgency or dysuria, diarrhea, joint swelling/pain or abnormal skin rash.  He had no prior history or diagnosis of cancer. His age appropriate screening programs are up-to-date. The patient has no prior diagnosis of autoimmune disease and was not prescribed corticosteroids related products. The patient is a smoker and currently smokes 1 pack of cigarettes per day for the last 25 years.  MEDICAL HISTORY:  Past Medical History  Diagnosis Date  . Thyroid nodule   . Anxiety   . GERD (gastroesophageal reflux disease)   . Headache(784.0)     2 x/week  . Arthritis     neck, shoulder, left knee  . Hypertension     under control with med., has been on med. x 1 yr.  . 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 Medial meniscus tear 06/2013    left knee  . Stuffy and runny nose 07/25/2013    clear drainage from nose  . Cough with sputum 07/25/2013    white  . Limited joint range of motion     cervical fusion 02/2013  . Wears partial dentures     upper    SURGICAL HISTORY: Past Surgical History  Procedure Laterality Date  . Appendectomy    . Eye surgery Left     cataract  . Anterior cervical decomp/discectomy fusion N/A 03/18/2013    Procedure: ANTERIOR  CERVICAL DECOMPRESSION/DISCECTOMY FUSION 3 LEVELS;  Surgeon: Erline Levine, MD;  Location: [facility];  Service: Neurosurgery;  Laterality: N/A;  Anterior Cervical Three-Six  Decompression/Diskectomy/Fusion  . Hemicolectomy Right 9/81/1914    with ileocolic anastomosis  . Small intestine surgery  04/24/2000    adhesiolysis, ileocolic anastomosis  . Circumcision  05/04/2006  . Shoulder arthroscopy w/ rotator cuff repair Left 07/16/2010  . Inguinal hernia repair    . Knee arthroscopy Left 08/01/2013    Procedure: LEFT ARTHROSCOPY KNEE;  Surgeon: Kerin Salen, MD;  Location: [facility];  Service: Orthopedics;  Laterality: Left;    SOCIAL HISTORY: History   Social History  . Marital Status: Married    Spouse Name: N/A    Number of Children: N/A  . Years of Education: N/A   Occupational History  . Not on file.   Social History Main Topics  . Smoking status: Current Every Day Smoker -- 1.00 packs/day for 25 years    Types: Cigarettes  . Smokeless tobacco: Never Used  . Alcohol Use: Yes     Comment: occasionally  . Drug Use: No  . Sexual Activity: Not on file   Other Topics Concern  . Not on file   Social History Narrative  . No narrative on file    FAMILY HISTORY: Family History  Problem Relation Age of Onset  . Cancer Father 20    colon  ca  . Cancer Sister 25    breast ca    ALLERGIES:  is allergic to chantix; morphine; and penicillins.  MEDICATIONS:  Current Outpatient Prescriptions  Medication Sig Dispense Refill  . albuterol (PROVENTIL HFA;VENTOLIN HFA) 108 (90 BASE) MCG/ACT inhaler Inhale 2 puffs into the lungs every 4 (four) hours as needed for wheezing or shortness of breath (cough, shortness of breath or wheezing.).  1 Inhaler  1  . atenolol-chlorthalidone (TENORETIC) 100-25 MG per tablet Take 1 tablet by mouth daily.  30 tablet  5  . diclofenac sodium (VOLTAREN) 1 % GEL Apply 2 g topically 4 (four) times daily as needed.      . fluticasone  (FLONASE) 50 MCG/ACT nasal spray Place 2 sprays into the nose daily.  16 g  6  . LORazepam (ATIVAN) 1 MG tablet Take 1 mg by mouth 2 (two) times daily between meals as needed for anxiety.      . methocarbamol (ROBAXIN) 500 MG tablet Take 500 mg by mouth 3 (three) times daily.      . Multiple Vitamin (MULTIVITAMIN WITH MINERALS) TABS Take 1 tablet by mouth daily.      Marland Kitchen omeprazole (PRILOSEC) 40 MG capsule Take 1 capsule (40 mg total) by mouth 2 (two) times daily.  60 capsule  5  . oxyCODONE-acetaminophen (PERCOCET) 10-325 MG per tablet Take 1-2 tablets by mouth every 6 (six) hours as needed for pain.  30 tablet  0  . sildenafil (VIAGRA) 100 MG tablet Take 100 mg by mouth daily as needed for erectile dysfunction.      . ferrous sulfate 325 (65 FE) MG tablet Take 325 mg by mouth 2 (two) times daily.       No current facility-administered medications for this visit.    REVIEW OF SYSTEMS:   Constitutional: Denies fevers, chills or abnormal night sweats Eyes: Denies blurriness of vision, double vision or watery eyes Ears, nose, mouth, throat, and face: Denies mucositis or sore throat Cardiovascular: Denies palpitation, chest discomfort or lower extremity swelling Gastrointestinal:  Denies nausea, heartburn or change in bowel habits Skin: Denies abnormal skin rashes Lymphatics: Denies new lymphadenopathy or easy bruising Neurological:Denies numbness, tingling or new weaknesses Behavioral/Psych: Mood is stable, no new changes  All other systems were reviewed with the patient and are negative.  PHYSICAL EXAMINATION: ECOG PERFORMANCE STATUS: 0 - Asymptomatic  Filed Vitals:   11/03/13 0959  BP: 119/89  Pulse: 63  Temp: 98.1 F (36.7 C)  Resp: 18   Filed Weights   11/03/13 0959  Weight: 192 lb 4.8 oz (87.227 kg)    GENERAL:alert, no distress and comfortable SKIN: skin color, texture, turgor are normal, no rashes or significant lesions EYES: normal, conjunctiva are pink and non-injected,  sclera clear OROPHARYNX:no exudate, no erythema and lips, buccal mucosa, and tongue normal  NECK: supple, thyroid normal size, non-tender, without nodularity LYMPH:  no palpable lymphadenopathy in the cervical, axillary or inguinal LUNGS: clear to auscultation and percussion with normal breathing effort HEART: regular rate & rhythm and no murmurs and no lower extremity edema ABDOMEN:abdomen soft, non-tender and normal bowel sounds Musculoskeletal:no cyanosis of digits and no clubbing  PSYCH: alert & oriented x 3 with fluent speech NEURO: no focal motor/sensory deficits  LABORATORY DATA:  I have reviewed the data as listed Recent Results (from the past 2160 hour(s))  CBC WITH DIFFERENTIAL     Status: Abnormal   Collection Time    08/25/13 12:45 PM      Result  Value Range   WBC 13.4 (*) 4.0 - 10.5 K/uL   RBC 4.80  4.22 - 5.81 MIL/uL   Hemoglobin 12.1 (*) 13.0 - 17.0 g/dL   HCT 36.3 (*) 39.0 - 52.0 %   MCV 75.6 (*) 78.0 - 100.0 fL   MCH 25.2 (*) 26.0 - 34.0 pg   MCHC 33.3  30.0 - 36.0 g/dL   RDW 19.5 (*) 11.5 - 15.5 %   Platelets 397  150 - 400 K/uL   Neutrophils Relative % 75  43 - 77 %   Neutro Abs 10.0 (*) 1.7 - 7.7 K/uL   Lymphocytes Relative 17  12 - 46 %   Lymphs Abs 2.3  0.7 - 4.0 K/uL   Monocytes Relative 8  3 - 12 %   Monocytes Absolute 1.1 (*) 0.1 - 1.0 K/uL   Eosinophils Relative 0  0 - 5 %   Eosinophils Absolute 0.1  0.0 - 0.7 K/uL   Basophils Relative 0  0 - 1 %   Basophils Absolute 0.0  0.0 - 0.1 K/uL  COMPREHENSIVE METABOLIC PANEL     Status: None   Collection Time    08/25/13 12:45 PM      Result Value Range   Sodium 136  135 - 145 mEq/L   Potassium 3.8  3.5 - 5.1 mEq/L   Chloride 98  96 - 112 mEq/L   CO2 26  19 - 32 mEq/L   Glucose, Bld 97  70 - 99 mg/dL   BUN 10  6 - 23 mg/dL   Creatinine, Ser 0.84  0.50 - 1.35 mg/dL   Calcium 9.7  8.4 - 10.5 mg/dL   Total Protein 7.4  6.0 - 8.3 g/dL   Albumin 3.8  3.5 - 5.2 g/dL   AST 22  0 - 37 U/L   ALT 19  0 - 53  U/L   Alkaline Phosphatase 86  39 - 117 U/L   Total Bilirubin 0.3  0.3 - 1.2 mg/dL   GFR calc non Af Amer >90  >90 mL/min   GFR calc Af Amer >90  >90 mL/min   Comment: (NOTE)     The eGFR has been calculated using the CKD EPI equation.     This calculation has not been validated in all clinical situations.     eGFR's persistently <90 mL/min signify possible Chronic Kidney     Disease.  LIPASE, BLOOD     Status: None   Collection Time    08/25/13 12:45 PM      Result Value Range   Lipase 39  11 - 59 U/L  URINALYSIS, ROUTINE W REFLEX MICROSCOPIC     Status: None   Collection Time    08/25/13  4:39 PM      Result Value Range   Color, Urine YELLOW  YELLOW   APPearance CLEAR  CLEAR   Specific Gravity, Urine 1.020  1.005 - 1.030   pH 6.5  5.0 - 8.0   Glucose, UA NEGATIVE  NEGATIVE mg/dL   Hgb urine dipstick NEGATIVE  NEGATIVE   Bilirubin Urine NEGATIVE  NEGATIVE   Ketones, ur NEGATIVE  NEGATIVE mg/dL   Protein, ur NEGATIVE  NEGATIVE mg/dL   Urobilinogen, UA 0.2  0.0 - 1.0 mg/dL   Nitrite NEGATIVE  NEGATIVE   Leukocytes, UA NEGATIVE  NEGATIVE   Comment: MICROSCOPIC NOT DONE ON URINES WITH NEGATIVE PROTEIN, BLOOD, LEUKOCYTES, NITRITE, OR GLUCOSE <1000 mg/dL.    RADIOGRAPHIC STUDIES: His last chest  x-ray from May 2014 and CT scan of the abdomen and pelvis from October 2014 were normal  ASSESSMENT:  Chronic leukocytosis likely secondary to bronchitis  PLAN #1 Leukocytosis The cause of his chronic leukocytosis fits the pattern of chronic bronchitis. 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 Chronic smoking can also cause leukocytosis. The pattern of his white blood cell count elevation fit into a reactive picture. It is likely if I repeat his blood work today it will be high as the patient has symptoms of bronchitis. #2 chronic tobacco abuse I spent a lot of time educating the patient importance of nicotine cessation. The patient has lost his insurance  recently. I recommend he called to quit line for help and assistance of quitting smoking. Once he has successfully quit smoking, I will bring him back and repeat blood work.

## 2014-01-05 DIAGNOSIS — M5417 Radiculopathy, lumbosacral region: Secondary | ICD-10-CM | POA: Insufficient documentation

## 2014-02-01 ENCOUNTER — Encounter (HOSPITAL_COMMUNITY): Payer: Self-pay | Admitting: Radiology

## 2014-02-01 ENCOUNTER — Emergency Department (HOSPITAL_COMMUNITY): Payer: Medicare Other

## 2014-02-01 ENCOUNTER — Emergency Department (HOSPITAL_COMMUNITY)
Admission: EM | Admit: 2014-02-01 | Discharge: 2014-02-01 | Disposition: A | Discharge status: Home / Self Care | Payer: Medicare Other | Attending: Emergency Medicine | Admitting: Emergency Medicine

## 2014-02-01 DIAGNOSIS — Z880 Allergy status to penicillin: Secondary | ICD-10-CM | POA: Insufficient documentation

## 2014-02-01 DIAGNOSIS — K219 Gastro-esophageal reflux disease without esophagitis: Secondary | ICD-10-CM | POA: Insufficient documentation

## 2014-02-01 DIAGNOSIS — F411 Generalized anxiety disorder: Secondary | ICD-10-CM | POA: Insufficient documentation

## 2014-02-01 DIAGNOSIS — Z862 Personal history of diseases of the blood and blood-forming organs and certain disorders involving the immune mechanism: Secondary | ICD-10-CM | POA: Insufficient documentation

## 2014-02-01 DIAGNOSIS — Z8639 Personal history of other endocrine, nutritional and metabolic disease: Secondary | ICD-10-CM | POA: Insufficient documentation

## 2014-02-01 DIAGNOSIS — F17200 Nicotine dependence, unspecified, uncomplicated: Secondary | ICD-10-CM | POA: Diagnosis not present

## 2014-02-01 DIAGNOSIS — M1710 Unilateral primary osteoarthritis, unspecified knee: Secondary | ICD-10-CM | POA: Insufficient documentation

## 2014-02-01 DIAGNOSIS — Z79899 Other long term (current) drug therapy: Secondary | ICD-10-CM | POA: Diagnosis not present

## 2014-02-01 DIAGNOSIS — K5289 Other specified noninfective gastroenteritis and colitis: Secondary | ICD-10-CM | POA: Diagnosis not present

## 2014-02-01 DIAGNOSIS — Z9089 Acquired absence of other organs: Secondary | ICD-10-CM | POA: Insufficient documentation

## 2014-02-01 DIAGNOSIS — Z87828 Personal history of other (healed) physical injury and trauma: Secondary | ICD-10-CM | POA: Diagnosis not present

## 2014-02-01 DIAGNOSIS — I10 Essential (primary) hypertension: Secondary | ICD-10-CM | POA: Insufficient documentation

## 2014-02-01 DIAGNOSIS — M19019 Primary osteoarthritis, unspecified shoulder: Secondary | ICD-10-CM | POA: Diagnosis not present

## 2014-02-01 DIAGNOSIS — K529 Noninfective gastroenteritis and colitis, unspecified: Secondary | ICD-10-CM

## 2014-02-01 DIAGNOSIS — IMO0002 Reserved for concepts with insufficient information to code with codable children: Secondary | ICD-10-CM | POA: Diagnosis not present

## 2014-02-01 LAB — URINALYSIS, ROUTINE W REFLEX MICROSCOPIC
BILIRUBIN URINE: NEGATIVE
Glucose, UA: NEGATIVE mg/dL
HGB URINE DIPSTICK: NEGATIVE
KETONES UR: NEGATIVE mg/dL
Leukocytes, UA: NEGATIVE
Nitrite: NEGATIVE
PROTEIN: NEGATIVE mg/dL
Specific Gravity, Urine: 1.018 (ref 1.005–1.030)
Urobilinogen, UA: 0.2 mg/dL (ref 0.0–1.0)
pH: 6 (ref 5.0–8.0)

## 2014-02-01 LAB — CBC WITH DIFFERENTIAL/PLATELET
Basophils Absolute: 0 10*3/uL (ref 0.0–0.1)
Basophils Relative: 0 % (ref 0–1)
EOS PCT: 2 % (ref 0–5)
Eosinophils Absolute: 0.3 10*3/uL (ref 0.0–0.7)
HCT: 35.3 % — ABNORMAL LOW (ref 39.0–52.0)
Hemoglobin: 12.2 g/dL — ABNORMAL LOW (ref 13.0–17.0)
Lymphocytes Relative: 34 % (ref 12–46)
Lymphs Abs: 3.8 10*3/uL (ref 0.7–4.0)
MCH: 28.5 pg (ref 26.0–34.0)
MCHC: 34.6 g/dL (ref 30.0–36.0)
MCV: 82.5 fL (ref 78.0–100.0)
Monocytes Absolute: 0.7 10*3/uL (ref 0.1–1.0)
Monocytes Relative: 7 % (ref 3–12)
Neutro Abs: 6.3 10*3/uL (ref 1.7–7.7)
Neutrophils Relative %: 57 % (ref 43–77)
Platelets: 289 10*3/uL (ref 150–400)
RBC: 4.28 MIL/uL (ref 4.22–5.81)
RDW: 15.4 % (ref 11.5–15.5)
WBC: 11 10*3/uL — ABNORMAL HIGH (ref 4.0–10.5)

## 2014-02-01 LAB — COMPREHENSIVE METABOLIC PANEL
ALK PHOS: 67 U/L (ref 39–117)
ALT: 25 U/L (ref 0–53)
AST: 24 U/L (ref 0–37)
Albumin: 3.5 g/dL (ref 3.5–5.2)
BILIRUBIN TOTAL: 0.4 mg/dL (ref 0.3–1.2)
BUN: 7 mg/dL (ref 6–23)
CALCIUM: 9.1 mg/dL (ref 8.4–10.5)
CO2: 21 mEq/L (ref 19–32)
Chloride: 105 mEq/L (ref 96–112)
Creatinine, Ser: 0.67 mg/dL (ref 0.50–1.35)
GFR calc non Af Amer: >90 mL/min (ref >90)
GLUCOSE: 83 mg/dL (ref 70–99)
POTASSIUM: 3.1 meq/L — AB (ref 3.7–5.3)
Sodium: 141 mEq/L (ref 137–147)
Total Protein: 6.3 g/dL (ref 6.0–8.3)

## 2014-02-01 LAB — LIPASE, BLOOD: Lipase: 22 U/L (ref 11–59)

## 2014-02-01 LAB — I-STAT CG4 LACTIC ACID, ED: Lactic Acid, Venous: 0.77 mmol/L (ref 0.5–2.2)

## 2014-02-01 MED ORDER — METOCLOPRAMIDE HCL 5 MG/ML IJ SOLN
10.0000 mg | Freq: Once | INTRAMUSCULAR | Status: DC
  Administered 2014-02-01: 10 mg via INTRAVENOUS
  Filled 2014-02-01: qty 2

## 2014-02-01 MED ORDER — SODIUM CHLORIDE 0.9 % IV SOLN
1000.0000 mL | INTRAVENOUS | Status: DC
  Administered 2014-02-01: 1000 mL via INTRAVENOUS

## 2014-02-01 MED ORDER — HYDROMORPHONE HCL PF 1 MG/ML IJ SOLN
INTRAMUSCULAR | Status: AC
  Filled 2014-02-01: qty 1

## 2014-02-01 MED ORDER — SODIUM CHLORIDE 0.9 % IV SOLN
1000.0000 mL | Freq: Once | INTRAVENOUS | Status: DC
  Administered 2014-02-01: 1000 mL via INTRAVENOUS

## 2014-02-01 MED ORDER — ONDANSETRON HCL 4 MG/2ML IJ SOLN
4.0000 mg | Freq: Once | INTRAMUSCULAR | Status: DC
  Administered 2014-02-01: 4 mg via INTRAVENOUS

## 2014-02-01 MED ORDER — METRONIDAZOLE 500 MG PO TABS
500.0000 mg | ORAL_TABLET | Freq: Once | ORAL | Status: DC
  Administered 2014-02-01: 500 mg via ORAL
  Filled 2014-02-01: qty 1

## 2014-02-01 MED ORDER — ONDANSETRON HCL 4 MG PO TABS: 4.0000 mg | ORAL_TABLET | Freq: Four times a day (QID) | ORAL | Status: DC | PRN

## 2014-02-01 MED ORDER — ONDANSETRON HCL 4 MG/2ML IJ SOLN
INTRAMUSCULAR | Status: AC
  Administered 2014-02-01: 4 mg via INTRAVENOUS
  Filled 2014-02-01: qty 2

## 2014-02-01 MED ORDER — LORAZEPAM 2 MG/ML IJ SOLN
1.0000 mg | Freq: Once | INTRAMUSCULAR | Status: DC
  Administered 2014-02-01: 1 mg via INTRAVENOUS
  Filled 2014-02-01: qty 1

## 2014-02-01 MED ORDER — IOHEXOL 300 MG/ML  SOLN
25.0000 mL | INTRAMUSCULAR | Status: DC
  Administered 2014-02-01: 25 mL via ORAL

## 2014-02-01 MED ORDER — METRONIDAZOLE 500 MG PO TABS: 500.0000 mg | ORAL_TABLET | Freq: Three times a day (TID) | ORAL | Status: DC

## 2014-02-01 MED ORDER — CIPROFLOXACIN HCL 500 MG PO TABS
500.0000 mg | ORAL_TABLET | Freq: Once | ORAL | Status: DC
  Administered 2014-02-01: 500 mg via ORAL
  Filled 2014-02-01: qty 1

## 2014-02-01 MED ORDER — IOHEXOL 300 MG/ML  SOLN
100.0000 mL | Freq: Once | INTRAMUSCULAR | Status: DC | PRN
  Administered 2014-02-01: 100 mL via INTRAVENOUS

## 2014-02-01 MED ORDER — OXYCODONE-ACETAMINOPHEN 5-325 MG PO TABS: 2.0000 | ORAL_TABLET | ORAL | Status: DC | PRN

## 2014-02-01 MED ORDER — OXYCODONE-ACETAMINOPHEN 5-325 MG PO TABS: 1.0000 | ORAL_TABLET | ORAL | Status: DC | PRN

## 2014-02-01 MED ORDER — CIPROFLOXACIN HCL 500 MG PO TABS: 500.0000 mg | ORAL_TABLET | Freq: Two times a day (BID) | ORAL | Status: DC

## 2014-02-01 MED ORDER — DIPHENHYDRAMINE HCL 50 MG/ML IJ SOLN
25.0000 mg | Freq: Once | INTRAMUSCULAR | Status: DC
  Administered 2014-02-01: 25 mg via INTRAVENOUS
  Filled 2014-02-01: qty 1

## 2014-02-01 MED ORDER — HYDROMORPHONE HCL PF 1 MG/ML IJ SOLN
1.0000 mg | Freq: Once | INTRAMUSCULAR | Status: DC
  Administered 2014-02-01: 1 mg via INTRAVENOUS

## 2014-02-01 MED ORDER — POTASSIUM CHLORIDE 10 MEQ/100ML IV SOLN
10.0000 meq | Freq: Once | INTRAVENOUS | Status: DC
  Administered 2014-02-01: 10 meq via INTRAVENOUS
  Filled 2014-02-01: qty 100

## 2014-02-01 NOTE — ED Provider Notes (Signed)
CSN: [id number]     Arrival date & time 02/01/14  0048 History   First MD Initiated Contact with Patient 02/01/14 0310     Chief Complaint  Patient presents with  . Abdominal Pain     (Consider location/radiation/quality/duration/timing/severity/associated sxs/prior Treatment) Patient is a 53 y.o. male presenting with abdominal pain. The history is provided by the patient.  Abdominal Pain  He complains of right upper quadrant pain which gradually spread to encompass the entire upper abdomen. There was initially some radiation to the back but that is no longer present. Pain has waxed and waned. Currently he rates pain at 5/10 but it has been as severe as 9/10. There's been associated nausea but no vomiting. He has not had a bowel movement or passed any flatus since onset of pain. He has a history of bowel obstruction with similar presentation including lack of vomiting. He tried taking Gas-X and milk of magnesia with no relief. He also took a dose of oxycodone-acetaminophen 10-French 25 with no relief. Pain is worse if she moves and better if he stays still. He has history of resection of portion of large and small bowel and subsequent bowel obstructions.  Past Medical History  Diagnosis Date  . Thyroid nodule   . Anxiety   . GERD (gastroesophageal reflux disease)   . Headache(784.0)     2 x/week  . Arthritis     neck, shoulder, left knee  . Hypertension     under control with med., has been on med. x 1 yr.  . 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 Medial meniscus tear 06/2013    left knee  . Stuffy and runny nose 07/25/2013    clear drainage from nose  . Cough with sputum 07/25/2013    white  . Limited joint range of motion     cervical fusion 02/2013  . Wears partial dentures     upper  . Leukocytosis, unspecified 11/03/2013   Past Surgical History  Procedure Laterality Date  . Appendectomy    . Eye surgery Left     cataract  . Anterior cervical decomp/discectomy fusion N/A  03/18/2013    Procedure: ANTERIOR CERVICAL DECOMPRESSION/DISCECTOMY FUSION 3 LEVELS;  Surgeon: Erline Levine, MD;  Location: [facility];  Service: Neurosurgery;  Laterality: N/A;  Anterior Cervical Three-Six  Decompression/Diskectomy/Fusion  . Hemicolectomy Right 02/02/8118    with ileocolic anastomosis  . Small intestine surgery  04/24/2000    adhesiolysis, ileocolic anastomosis  . Circumcision  05/04/2006  . Shoulder arthroscopy w/ rotator cuff repair Left 07/16/2010  . Inguinal hernia repair    . Knee arthroscopy Left 08/01/2013    Procedure: LEFT ARTHROSCOPY KNEE;  Surgeon: Kerin Salen, MD;  Location: [facility];  Service: Orthopedics;  Laterality: Left;   Family History  Problem Relation Age of Onset  . Cancer Father 7    colon ca  . Cancer Sister 35    breast ca   History  Substance Use Topics  . Smoking status: Current Every Day Smoker -- 1.00 packs/day for 25 years    Types: Cigarettes  . Smokeless tobacco: Never Used  . Alcohol Use: Yes     Comment: occasionally    Review of Systems  Gastrointestinal: Positive for abdominal pain.  All other systems reviewed and are negative.     Allergies  Chantix; Morphine; and Penicillins  Home Medications   Current Outpatient Rx  Name  Route  Sig  Dispense  Refill  . albuterol (PROVENTIL HFA;VENTOLIN HFA) 108 (90 BASE) MCG/ACT inhaler   Inhalation   Inhale 2 puffs into the lungs every 4 (four) hours as needed for wheezing or shortness of breath (cough, shortness of breath or wheezing.).   1 Inhaler   1   . atenolol-chlorthalidone (TENORETIC) 100-25 MG per tablet   Oral   Take 1 tablet by mouth daily.   30 tablet   5   . diclofenac sodium (VOLTAREN) 1 % GEL   Topical   Apply 2 g topically 4 (four) times daily as needed.         . ferrous sulfate 325 (65 FE) MG tablet   Oral   Take 325 mg by mouth 2 (two) times daily.         . fluticasone (FLONASE) 50 MCG/ACT nasal spray   Nasal   Place 2  sprays into the nose daily.   16 g   6   . LORazepam (ATIVAN) 1 MG tablet   Oral   Take 1 mg by mouth 2 (two) times daily between meals as needed for anxiety.         . methocarbamol (ROBAXIN) 500 MG tablet   Oral   Take 500 mg by mouth 3 (three) times daily.         . Multiple Vitamin (MULTIVITAMIN WITH MINERALS) TABS   Oral   Take 1 tablet by mouth daily.         Marland Kitchen omeprazole (PRILOSEC) 40 MG capsule   Oral   Take 1 capsule (40 mg total) by mouth 2 (two) times daily.   60 capsule   5   . oxyCODONE-acetaminophen (PERCOCET) 10-325 MG per tablet   Oral   Take 1-2 tablets by mouth every 6 (six) hours as needed for pain.   30 tablet   0   . sildenafil (VIAGRA) 100 MG tablet   Oral   Take 100 mg by mouth daily as needed for erectile dysfunction.          There were no vitals taken for this visit. Physical Exam  Nursing note and vitals reviewed.  53 year old male, resting comfortably and in no acute distress. Vital signs are normal. Oxygen saturation is 94%, which is normal. Head is normocephalic and atraumatic. PERRLA, EOMI. Oropharynx is clear. Neck is nontender and supple without adenopathy or JVD. Back is nontender and there is no CVA tenderness. Lungs are clear without rales, wheezes, or rhonchi. Chest is nontender. Heart has regular rate and rhythm without murmur. Abdomen is soft, flat, with diffuse tenderness. Maximum tenderness is in the epigastrium and right upper quadrant negative Murphy sign. There is no rebound or guarding. There are no masses or hepatosplenomegaly and peristalsis is hypoactive. Extremities have no cyanosis or edema, full range of motion is present. Skin is warm and dry without rash. Neurologic: Mental status is normal, cranial nerves are intact, there are no motor or sensory deficits.  ED Course  Procedures (including critical care time) Labs Review Results for orders placed during the hospital encounter of 02/01/14  CBC WITH  DIFFERENTIAL      Result Value Ref Range   WBC 11.0 (*) 4.0 - 10.5 K/uL   RBC 4.28  4.22 - 5.81 MIL/uL   Hemoglobin 12.2 (*) 13.0 - 17.0 g/dL   HCT 35.3 (*) 39.0 - 52.0 %   MCV 82.5  78.0 - 100.0 fL   MCH 28.5  26.0 - 34.0 pg   MCHC  34.6  30.0 - 36.0 g/dL   RDW 72.5  36.6 - 44.0 %   Platelets 289  150 - 400 K/uL   Neutrophils Relative % 57  43 - 77 %   Neutro Abs 6.3  1.7 - 7.7 K/uL   Lymphocytes Relative 34  12 - 46 %   Lymphs Abs 3.8  0.7 - 4.0 K/uL   Monocytes Relative 7  3 - 12 %   Monocytes Absolute 0.7  0.1 - 1.0 K/uL   Eosinophils Relative 2  0 - 5 %   Eosinophils Absolute 0.3  0.0 - 0.7 K/uL   Basophils Relative 0  0 - 1 %   Basophils Absolute 0.0  0.0 - 0.1 K/uL  COMPREHENSIVE METABOLIC PANEL      Result Value Ref Range   Sodium 141  137 - 147 mEq/L   Potassium 3.1 (*) 3.7 - 5.3 mEq/L   Chloride 105  96 - 112 mEq/L   CO2 21  19 - 32 mEq/L   Glucose, Bld 83  70 - 99 mg/dL   BUN 7  6 - 23 mg/dL   Creatinine, Ser 3.47  0.50 - 1.35 mg/dL   Calcium 9.1  8.4 - 42.5 mg/dL   Total Protein 6.3  6.0 - 8.3 g/dL   Albumin 3.5  3.5 - 5.2 g/dL   AST 24  0 - 37 U/L   ALT 25  0 - 53 U/L   Alkaline Phosphatase 67  39 - 117 U/L   Total Bilirubin 0.4  0.3 - 1.2 mg/dL   GFR calc non Af Amer >90  >90 mL/min   GFR calc Af Amer >90  >90 mL/min  LIPASE, BLOOD      Result Value Ref Range   Lipase 22  11 - 59 U/L  URINALYSIS, ROUTINE W REFLEX MICROSCOPIC      Result Value Ref Range   Color, Urine YELLOW  YELLOW   APPearance CLEAR  CLEAR   Specific Gravity, Urine 1.018  1.005 - 1.030   pH 6.0  5.0 - 8.0   Glucose, UA NEGATIVE  NEGATIVE mg/dL   Hgb urine dipstick NEGATIVE  NEGATIVE   Bilirubin Urine NEGATIVE  NEGATIVE   Ketones, ur NEGATIVE  NEGATIVE mg/dL   Protein, ur NEGATIVE  NEGATIVE mg/dL   Urobilinogen, UA 0.2  0.0 - 1.0 mg/dL   Nitrite NEGATIVE  NEGATIVE   Leukocytes, UA NEGATIVE  NEGATIVE  I-STAT CG4 LACTIC ACID, ED      Result Value Ref Range   Lactic Acid, Venous  0.77  0.5 - 2.2 mmol/L   Imaging Review Ct Abdomen Pelvis W Contrast  02/01/2014   CLINICAL DATA:  abdominal pain  EXAM: CT ABDOMEN AND PELVIS WITH CONTRAST  TECHNIQUE: Multidetector CT imaging of the abdomen and pelvis was performed using the standard protocol following bolus administration of intravenous contrast.  CONTRAST:  OMNIPAQUE IOHEXOL 300 MG/ML  SOLN  COMPARISON:  CT ABD/PELVIS W CM dated 08/25/2013  FINDINGS: Included view of the lung bases are clear. Mild paraseptal emphysema. Linear atelectasis or scarring in the lung bases is similar. Visualized heart and pericardium are unremarkable.  The liver, spleen, gallbladder, pancreas and left adrenal gland are unremarkable. Stable 19 mm right adrenal nodule  Mild gastric wall thickening is likely related to underdistention, though overall improved from prior examination. Fluid filled nondistended small and large bowel with air-fluid levels. Enteric contrast has not yet reached the distal small bowel. Right upper abdomen bowel  surgical anastomosis may reflect right hemicolectomy. Normal appendix. No intraperitoneal free fluid nor free air.  Kidneys are orthotopic, demonstrating symmetric enhancement without hydronephrosis or renal masses. Too small to characterize hypodensity in the right kidney. 2 mm left upper pole nephrolithiasis. The unopacified ureters are normal in course and caliber. Delayed imaging through the kidneys demonstrates symmetric prompt excretion to the proximal urinary collecting system. Urinary bladder is partially distended and unremarkable.  Aortoiliac vessels are normal in course and caliber with mild calcific atherosclerosis. No lymphadenopathy by CT size criteria. Internal reproductive organs are unremarkable. Scarring in the anterior abdominal wall.  IMPRESSION: Fluid filled nondistended small and large bowel likely reflect enteritis, status post apparent right hemicolectomy. No bowel obstruction.  2 mm nonobstructing left  upper pole nephrolithiasis.  Stable right adrenal nodule.   Electronically Signed   By: Elon Alas   On: 02/01/2014 05:28   Images viewed by me.  MDM   Final diagnoses:  Enteritis    Abdominal pain worrisome for recurrent bowel obstruction. Old records are reviewed and he did have a hospitalization for a bowel obstruction 5 years ago and has had several other CT scans of abdomen since then which did not show any obstruction. He will be sent for CT of his abdomen and pelvis. Screening labs are obtained and he will be given IV fluids as well as IV hydromorphone and IV ondansetron.   Delora Fuel, MD 20/94/70 [id number]

## 2014-02-01 NOTE — ED Notes (Signed)
Patient arrives from home with complaint of intense abdominal pain. Significant history of abdominal surgeries with partial resections of both intestines and multiple blockages.

## 2014-02-01 NOTE — Discharge Instructions (Signed)
Your CT scan suggests enteritis. This can be from a virus or a bacterium. You are being given an antibiotic in case it is a bacterial infection. Return to the ED if your pain is getting worse or you start running a high fever.   Colitis Colitis is inflammation of the colon. Colitis can be a short-term or long-standing (chronic) illness. Crohn's disease and ulcerative colitis are 2 types of colitis which are chronic. They usually require lifelong treatment. CAUSES  There are many different causes of colitis, including:  Viruses.  Germs (bacteria).  Medicine reactions. SYMPTOMS   Diarrhea.  Intestinal bleeding.  Pain.  Fever.  Throwing up (vomiting).  Tiredness (fatigue).  Weight loss.  Bowel blockage. DIAGNOSIS  The diagnosis of colitis is based on examination and stool or blood tests. X-rays, CT scan, and colonoscopy may also be needed. TREATMENT  Treatment may include:  Fluids given through the vein (intravenously).  Bowel rest (nothing to eat or drink for a period of time).  Medicine for pain and diarrhea.  Medicines (antibiotics) that kill germs.  Cortisone medicines.  Surgery. HOME CARE INSTRUCTIONS   Get plenty of rest.  Drink enough water and fluids to keep your urine clear or pale yellow.  Eat a well-balanced diet.  Call your caregiver for follow-up as recommended. SEEK IMMEDIATE MEDICAL CARE IF:   You develop chills.  You have an oral temperature above 102 F (38.9 C), not controlled by medicine.  You have extreme weakness, fainting, or dehydration.  You have repeated vomiting.  You develop severe belly (abdominal) pain or are passing bloody or tarry stools. MAKE SURE YOU:   Understand these instructions.  Will watch your condition.  Will get help right away if you are not doing well or get worse. Document Released: 11/20/2004 Document Revised: 01/05/2012 Document Reviewed: 02/15/2010 [facility] Patient Information 2014 [place],  [place].  Ciprofloxacin tablets What is this medicine? CIPROFLOXACIN (sip roe FLOX a sin) is a quinolone antibiotic. It is used to treat certain kinds of bacterial infections. It will not work for colds, flu, or other viral infections. This medicine may be used for other purposes; ask your health care provider or pharmacist if you have questions. COMMON BRAND NAME(S): Cipro What should I tell my health care provider before I take this medicine? They need to know if you have any of these conditions: -bone problems -cerebral disease -joint problems -irregular heartbeat -kidney disease -liver disease -myasthenia gravis -seizure disorder -tendon problems -an unusual or allergic reaction to ciprofloxacin, other antibiotics or medicines, foods, dyes, or preservatives -pregnant or trying to get pregnant -breast-feeding How should I use this medicine? Take this medicine by mouth with a glass of water. Follow the directions on the prescription label. Take your medicine at regular intervals. Do not take your medicine more often than directed. Take all of your medicine as directed even if you think your are better. Do not skip doses or stop your medicine early. You can take this medicine with food or on an empty stomach. It can be taken with a meal that contains dairy or calcium, but do not take it alone with a dairy product, like milk or yogurt or calcium-fortified juice. A special MedGuide will be given to you by the pharmacist with each prescription and refill. Be sure to read this information carefully each time. Talk to your pediatrician regarding the use of this medicine in children. Special care may be needed. Overdosage: If you think you have taken too much of this  medicine contact a poison control center or emergency room at once. NOTE: This medicine is only for you. Do not share this medicine with others. What if I miss a dose? If you miss a dose, take it as soon as you can. If it is almost  time for your next dose, take only that dose. Do not take double or extra doses. What may interact with this medicine? Do not take this medicine with any of the following medications: -cisapride -droperidol -terfenadine -tizanidine This medicine may also interact with the following medications: -antacids -caffeine -cyclosporin -didanosine (ddI) buffered tablets or powder -medicines for diabetes -medicines for inflammation like ibuprofen, naproxen -methotrexate -multivitamins -omeprazole -phenytoin -probenecid -sucralfate -theophylline -warfarin This list may not describe all possible interactions. Give your health care provider a list of all the medicines, herbs, non-prescription drugs, or dietary supplements you use. Also tell them if you smoke, drink alcohol, or use illegal drugs. Some items may interact with your medicine. What should I watch for while using this medicine? Tell your doctor or health care professional if your symptoms do not improve. Do not treat diarrhea with over the counter products. Contact your doctor if you have diarrhea that lasts more than 2 days or if it is severe and watery. You may get drowsy or dizzy. Do not drive, use machinery, or do anything that needs mental alertness until you know how this medicine affects you. Do not stand or sit up quickly, especially if you are an older patient. This reduces the risk of dizzy or fainting spells. This medicine can make you more sensitive to the sun. Keep out of the sun. If you cannot avoid being in the sun, wear protective clothing and use sunscreen. Do not use sun lamps or tanning beds/booths. Avoid antacids, aluminum, calcium, iron, magnesium, and zinc products for 6 hours before and 2 hours after taking a dose of this medicine. What side effects may I notice from receiving this medicine? Side effects that you should report to your doctor or health care professional as soon as possible: - allergic reactions  like skin rash, itching or hives, swelling of the face, lips, or tongue - breathing problems - confusion, nightmares or hallucinations - feeling faint or lightheaded, falls - irregular heartbeat - joint, muscle or tendon pain or swelling - pain or trouble passing urine -persistent headache with or without blurred vision - redness, blistering, peeling or loosening of the skin, including inside the mouth - seizure - unusual pain, numbness, tingling, or weakness Side effects that usually do not require medical attention (report to your doctor or health care professional if they continue or are bothersome): - diarrhea - nausea or stomach upset - white patches or sores in the mouth This list may not describe all possible side effects. Call your doctor for medical advice about side effects. You may report side effects to FDA at 1-800-FDA-1088. Where should I keep my medicine? Keep out of the reach of children. Store at room temperature below 30 degrees C (86 degrees F). Keep container tightly closed. Throw away any unused medicine after the expiration date. NOTE: This sheet is a summary. It may not cover all possible information. If you have questions about this medicine, talk to your doctor, pharmacist, or health care provider.  2014, Elsevier/Gold Standard. (2011-10-31 12:53:06)  Metronidazole tablets or capsules What is this medicine? METRONIDAZOLE (me troe NI da zole) is an antiinfective. It is used to treat certain kinds of bacterial and protozoal infections. It will not  work for colds, flu, or other viral infections. This medicine may be used for other purposes; ask your health care provider or pharmacist if you have questions. COMMON BRAND NAME(S): Flagyl What should I tell my health care provider before I take this medicine? They need to know if you have any of these conditions: -anemia or other blood disorders -disease of the nervous system -fungal or yeast  infection -if you drink alcohol containing drinks -liver disease -seizures -an unusual or allergic reaction to metronidazole, or other medicines, foods, dyes, or preservatives -pregnant or trying to get pregnant -breast-feeding How should I use this medicine? Take this medicine by mouth with a full glass of water. Follow the directions on the prescription label. Take your medicine at regular intervals. Do not take your medicine more often than directed. Take all of your medicine as directed even if you think you are better. Do not skip doses or stop your medicine early. Talk to your pediatrician regarding the use of this medicine in children. Special care may be needed. Overdosage: If you think you have taken too much of this medicine contact a poison control center or emergency room at once. NOTE: This medicine is only for you. Do not share this medicine with others. What if I miss a dose? If you miss a dose, take it as soon as you can. If it is almost time for your next dose, take only that dose. Do not take double or extra doses. What may interact with this medicine? Do not take this medicine with any of the following medications: -alcohol or any product that contains alcohol -amprenavir oral solution -cisapride -disulfiram -dofetilide -dronedarone -paclitaxel injection -pimozide -ritonavir oral solution -sertraline oral solution -sulfamethoxazole-trimethoprim injection -thioridazine -ziprasidone This medicine may also interact with the following medications: -cimetidine -lithium -other medicines that prolong the QT interval (cause an abnormal heart rhythm) -phenobarbital -phenytoin -warfarin This list may not describe all possible interactions. Give your health care provider a list of all the medicines, herbs, non-prescription drugs, or dietary supplements you use. Also tell them if you smoke, drink alcohol, or use illegal drugs. Some items may interact with your  medicine. What should I watch for while using this medicine? Tell your doctor or health care professional if your symptoms do not improve or if they get worse. You may get drowsy or dizzy. Do not drive, use machinery, or do anything that needs mental alertness until you know how this medicine affects you. Do not stand or sit up quickly, especially if you are an older patient. This reduces the risk of dizzy or fainting spells. Avoid alcoholic drinks while you are taking this medicine and for three days afterward. Alcohol may make you feel dizzy, sick, or flushed. If you are being treated for a sexually transmitted disease, avoid sexual contact until you have finished your treatment. Your sexual partner may also need treatment. What side effects may I notice from receiving this medicine? Side effects that you should report to your doctor or health care professional as soon as possible: -allergic reactions like skin rash or hives, swelling of the face, lips, or tongue -confusion, clumsiness -difficulty speaking -discolored or sore mouth -dizziness -fever, infection -numbness, tingling, pain or weakness in the hands or feet -trouble passing urine or change in the amount of urine -redness, blistering, peeling or loosening of the skin, including inside the mouth -seizures -unusually weak or tired -vaginal irritation, dryness, or discharge Side effects that usually do not require medical attention (report to  your doctor or health care professional if they continue or are bothersome): -diarrhea -headache -irritability -metallic taste -nausea -stomach pain or cramps -trouble sleeping This list may not describe all possible side effects. Call your doctor for medical advice about side effects. You may report side effects to FDA at 1-800-FDA-1088. Where should I keep my medicine? Keep out of the reach of children. Store at room temperature below 25 degrees C (77 degrees F). Protect from light.  Keep container tightly closed. Throw away any unused medicine after the expiration date. NOTE: This sheet is a summary. It may not cover all possible information. If you have questions about this medicine, talk to your doctor, pharmacist, or health care provider.  2014, [employer]vier/Gold Standard. (2013-04-12 14:08:26)  Ondansetron tablets What is this medicine? ONDANSETRON (on DAN se tron) is used to treat nausea and vomiting caused by chemotherapy. It is also used to prevent or treat nausea and vomiting after surgery. This medicine may be used for other purposes; ask your health care provider or pharmacist if you have questions. COMMON BRAND NAME(S): Zofran What should I tell my health care provider before I take this medicine? They need to know if you have any of these conditions: -heart disease -history of irregular heartbeat -liver disease -low levels of magnesium or potassium in the blood -an unusual or allergic reaction to ondansetron, granisetron, other medicines, foods, dyes, or preservatives -pregnant or trying to get pregnant -breast-feeding How should I use this medicine? Take this medicine by mouth with a glass of water. Follow the directions on your prescription label. Take your doses at regular intervals. Do not take your medicine more often than directed. Talk to your pediatrician regarding the use of this medicine in children. Special care may be needed. Overdosage: If you think you have taken too much of this medicine contact a poison control center or emergency room at once. NOTE: This medicine is only for you. Do not share this medicine with others. What if I miss a dose? If you miss a dose, take it as soon as you can. If it is almost time for your next dose, take only that dose. Do not take double or extra doses. What may interact with this medicine? Do not take this medicine with any of the following medications: -apomorphine -certain medicines for fungal infections  like fluconazole, itraconazole, ketoconazole, posaconazole, voriconazole -cisapride -dofetilide -dronedarone -pimozide -thioridazine -ziprasidone  This medicine may also interact with the following medications: -carbamazepine -certain medicines for depression, anxiety, or psychotic disturbances -fentanyl -linezolid -MAOIs like Carbex, Eldepryl, Marplan, Nardil, and Parnate -methylene blue (injected into a vein) -other medicines that prolong the QT interval (cause an abnormal heart rhythm) -phenytoin -rifampicin -tramadol This list may not describe all possible interactions. Give your health care provider a list of all the medicines, herbs, non-prescription drugs, or dietary supplements you use. Also tell them if you smoke, drink alcohol, or use illegal drugs. Some items may interact with your medicine. What should I watch for while using this medicine? Check with your doctor or health care professional right away if you have any sign of an allergic reaction. What side effects may I notice from receiving this medicine? Side effects that you should report to your doctor or health care professional as soon as possible: -allergic reactions like skin rash, itching or hives, swelling of the face, lips or tongue -breathing problems -confusion -dizziness -fast or irregular heartbeat -feeling faint or lightheaded, falls -fever and chills -loss of balance or coordination -seizures -  sweating -swelling of the hands or feet -tightness in the chest -tremors -unusually weak or tired Side effects that usually do not require medical attention (report to your doctor or health care professional if they continue or are bothersome): -constipation or diarrhea -headache This list may not describe all possible side effects. Call your doctor for medical advice about side effects. You may report side effects to FDA at 1-800-FDA-1088. Where should I keep my medicine? Keep out of the reach of  children. Store between 2 and 30 degrees C (36 and 86 degrees F). Throw away any unused medicine after the expiration date. NOTE: This sheet is a summary. It may not cover all possible information. If you have questions about this medicine, talk to your doctor, pharmacist, or health care provider.  2014, [employer]vier/Gold Standard. (2013-07-20 16:27:45)  Acetaminophen; Oxycodone tablets What is this medicine? ACETAMINOPHEN; OXYCODONE (a set a MEE noe fen; ox i KOE done) is a pain reliever. It is used to treat mild to moderate pain. This medicine may be used for other purposes; ask your health care provider or pharmacist if you have questions. COMMON BRAND NAME(S): Endocet, Magnacet, Narvox, Percocet, Perloxx, Primalev, Primlev, Roxicet, Xolox What should I tell my health care provider before I take this medicine? They need to know if you have any of these conditions: -brain tumor -Crohn's disease, inflammatory bowel disease, or ulcerative colitis -drug abuse or addiction -head injury -heart or circulation problems -if you often drink alcohol -kidney disease or problems going to the bathroom -liver disease -lung disease, asthma, or breathing problems -an unusual or allergic reaction to acetaminophen, oxycodone, other opioid analgesics, other medicines, foods, dyes, or preservatives -pregnant or trying to get pregnant -breast-feeding How should I use this medicine? Take this medicine by mouth with a full glass of water. Follow the directions on the prescription label. Take your medicine at regular intervals. Do not take your medicine more often than directed. Talk to your pediatrician regarding the use of this medicine in children. Special care may be needed. Patients over 86 years old may have a stronger reaction and need a smaller dose. Overdosage: If you think you have taken too much of this medicine contact a poison control center or emergency room at once. NOTE: This medicine is only for  you. Do not share this medicine with others. What if I miss a dose? If you miss a dose, take it as soon as you can. If it is almost time for your next dose, take only that dose. Do not take double or extra doses. What may interact with this medicine? -alcohol -antihistamines -barbiturates like amobarbital, butalbital, butabarbital, methohexital, pentobarbital, phenobarbital, thiopental, and secobarbital -benztropine -drugs for bladder problems like solifenacin, trospium, oxybutynin, tolterodine, hyoscyamine, and methscopolamine -drugs for breathing problems like ipratropium and tiotropium -drugs for certain stomach or intestine problems like propantheline, homatropine methylbromide, glycopyrrolate, atropine, belladonna, and dicyclomine -general anesthetics like etomidate, ketamine, nitrous oxide, propofol, desflurane, enflurane, halothane, isoflurane, and sevoflurane -medicines for depression, anxiety, or psychotic disturbances -medicines for sleep -muscle relaxants -naltrexone -narcotic medicines (opiates) for pain -phenothiazines like perphenazine, thioridazine, chlorpromazine, mesoridazine, fluphenazine, prochlorperazine, promazine, and trifluoperazine -scopolamine -tramadol -trihexyphenidyl This list may not describe all possible interactions. Give your health care provider a list of all the medicines, herbs, non-prescription drugs, or dietary supplements you use. Also tell them if you smoke, drink alcohol, or use illegal drugs. Some items may interact with your medicine. What should I watch for while using this medicine? Tell your doctor or  health care professional if your pain does not go away, if it gets worse, or if you have new or a different type of pain. You may develop tolerance to the medicine. Tolerance means that you will need a higher dose of the medication for pain relief. Tolerance is normal and is expected if you take this medicine for a long time. Do not suddenly stop  taking your medicine because you may develop a severe reaction. Your body becomes used to the medicine. This does NOT mean you are addicted. Addiction is a behavior related to getting and using a drug for a non-medical reason. If you have pain, you have a medical reason to take pain medicine. Your doctor will tell you how much medicine to take. If your doctor wants you to stop the medicine, the dose will be slowly lowered over time to avoid any side effects. You may get drowsy or dizzy. Do not drive, use machinery, or do anything that needs mental alertness until you know how this medicine affects you. Do not stand or sit up quickly, especially if you are an older patient. This reduces the risk of dizzy or fainting spells. Alcohol may interfere with the effect of this medicine. Avoid alcoholic drinks. There are different types of narcotic medicines (opiates) for pain. If you take more than one type at the same time, you may have more side effects. Give your health care provider a list of all medicines you use. Your doctor will tell you how much medicine to take. Do not take more medicine than directed. Call emergency for help if you have problems breathing. The medicine will cause constipation. Try to have a bowel movement at least every 2 to 3 days. If you do not have a bowel movement for 3 days, call your doctor or health care professional. Do not take Tylenol (acetaminophen) or medicines that have acetaminophen with this medicine. Too much acetaminophen can be very dangerous. Many nonprescription medicines contain acetaminophen. Always read the labels carefully to avoid taking more acetaminophen. What side effects may I notice from receiving this medicine? Side effects that you should report to your doctor or health care professional as soon as possible: -allergic reactions like skin rash, itching or hives, swelling of the face, lips, or tongue -breathing difficulties, wheezing -confusion -light  headedness or fainting spells -severe stomach pain -unusually weak or tired -yellowing of the skin or the whites of the eyes  Side effects that usually do not require medical attention (report to your doctor or health care professional if they continue or are bothersome): -dizziness -drowsiness -nausea -vomiting This list may not describe all possible side effects. Call your doctor for medical advice about side effects. You may report side effects to FDA at 1-800-FDA-1088. Where should I keep my medicine? Keep out of the reach of children. This medicine can be abused. Keep your medicine in a safe place to protect it from theft. Do not share this medicine with anyone. Selling or giving away this medicine is dangerous and against the law. Store at room temperature between 20 and 25 degrees C (68 and 77 degrees F). Keep container tightly closed. Protect from light. This medicine may cause accidental overdose and death if it is taken by other adults, children, or pets. Flush any unused medicine down the toilet to reduce the chance of harm. Do not use the medicine after the expiration date. NOTE: This sheet is a summary. It may not cover all possible information. If you have questions  about this medicine, talk to your doctor, pharmacist, or health care provider.  2014, [employer] Standard. (2013-06-06 13:17:35)

## 2014-02-10 DIAGNOSIS — D649 Anemia, unspecified: Secondary | ICD-10-CM | POA: Diagnosis not present

## 2014-02-10 DIAGNOSIS — F410 Panic disorder [episodic paroxysmal anxiety] without agoraphobia: Secondary | ICD-10-CM | POA: Diagnosis not present

## 2014-02-10 DIAGNOSIS — F17200 Nicotine dependence, unspecified, uncomplicated: Secondary | ICD-10-CM | POA: Diagnosis not present

## 2014-02-10 DIAGNOSIS — Z125 Encounter for screening for malignant neoplasm of prostate: Secondary | ICD-10-CM | POA: Diagnosis not present

## 2014-02-10 DIAGNOSIS — R51 Headache: Secondary | ICD-10-CM | POA: Diagnosis not present

## 2014-02-10 DIAGNOSIS — Z0000 Encounter for general adult medical examination without abnormal findings: Secondary | ICD-10-CM | POA: Diagnosis not present

## 2014-02-10 DIAGNOSIS — E041 Nontoxic single thyroid nodule: Secondary | ICD-10-CM | POA: Diagnosis not present

## 2014-02-10 DIAGNOSIS — Z136 Encounter for screening for cardiovascular disorders: Secondary | ICD-10-CM | POA: Diagnosis not present

## 2014-02-10 DIAGNOSIS — I10 Essential (primary) hypertension: Secondary | ICD-10-CM | POA: Diagnosis not present

## 2014-02-10 DIAGNOSIS — M5090 Cervical disc disorder, unspecified, unspecified cervical region: Secondary | ICD-10-CM | POA: Diagnosis not present

## 2014-03-03 DIAGNOSIS — R51 Headache: Secondary | ICD-10-CM | POA: Diagnosis not present

## 2014-03-03 DIAGNOSIS — H109 Unspecified conjunctivitis: Secondary | ICD-10-CM | POA: Diagnosis not present

## 2014-03-03 DIAGNOSIS — R6882 Decreased libido: Secondary | ICD-10-CM | POA: Diagnosis not present

## 2014-03-09 DIAGNOSIS — M5030 Other cervical disc degeneration, unspecified cervical region: Secondary | ICD-10-CM | POA: Diagnosis not present

## 2014-03-09 DIAGNOSIS — R51 Headache: Secondary | ICD-10-CM | POA: Diagnosis not present

## 2014-03-09 DIAGNOSIS — M5412 Radiculopathy, cervical region: Secondary | ICD-10-CM | POA: Diagnosis not present

## 2014-03-09 DIAGNOSIS — M47812 Spondylosis without myelopathy or radiculopathy, cervical region: Secondary | ICD-10-CM | POA: Diagnosis not present

## 2014-03-30 DIAGNOSIS — H01009 Unspecified blepharitis unspecified eye, unspecified eyelid: Secondary | ICD-10-CM | POA: Diagnosis not present

## 2014-04-07 DIAGNOSIS — H2589 Other age-related cataract: Secondary | ICD-10-CM | POA: Diagnosis not present

## 2014-04-07 DIAGNOSIS — H01009 Unspecified blepharitis unspecified eye, unspecified eyelid: Secondary | ICD-10-CM | POA: Diagnosis not present

## 2014-04-07 DIAGNOSIS — H5200 Hypermetropia, unspecified eye: Secondary | ICD-10-CM | POA: Diagnosis not present

## 2014-04-07 DIAGNOSIS — H52229 Regular astigmatism, unspecified eye: Secondary | ICD-10-CM | POA: Diagnosis not present

## 2014-05-04 DIAGNOSIS — K2970 Gastritis, unspecified, without bleeding: Secondary | ICD-10-CM | POA: Diagnosis not present

## 2014-05-04 DIAGNOSIS — K2990 Gastroduodenitis, unspecified, without bleeding: Secondary | ICD-10-CM | POA: Diagnosis not present

## 2014-05-04 DIAGNOSIS — L255 Unspecified contact dermatitis due to plants, except food: Secondary | ICD-10-CM | POA: Diagnosis not present

## 2014-05-04 DIAGNOSIS — F411 Generalized anxiety disorder: Secondary | ICD-10-CM | POA: Diagnosis not present

## 2014-05-04 DIAGNOSIS — F410 Panic disorder [episodic paroxysmal anxiety] without agoraphobia: Secondary | ICD-10-CM | POA: Diagnosis not present

## 2014-05-04 DIAGNOSIS — I10 Essential (primary) hypertension: Secondary | ICD-10-CM | POA: Diagnosis not present

## 2014-05-04 DIAGNOSIS — N529 Male erectile dysfunction, unspecified: Secondary | ICD-10-CM | POA: Diagnosis not present

## 2014-05-12 ENCOUNTER — Ambulatory Visit
Admission: RE | Admit: 2014-05-12 | Discharge: 2014-05-12 | Disposition: A | Discharge status: Home / Self Care | Payer: Medicare Other | Source: Ambulatory Visit | Attending: Family Medicine | Admitting: Family Medicine

## 2014-05-12 ENCOUNTER — Other Ambulatory Visit: Payer: Self-pay | Admitting: Family Medicine

## 2014-05-12 DIAGNOSIS — R350 Frequency of micturition: Secondary | ICD-10-CM | POA: Diagnosis not present

## 2014-05-12 DIAGNOSIS — Z87442 Personal history of urinary calculi: Secondary | ICD-10-CM | POA: Diagnosis not present

## 2014-05-12 DIAGNOSIS — M549 Dorsalgia, unspecified: Secondary | ICD-10-CM

## 2014-05-12 DIAGNOSIS — N200 Calculus of kidney: Secondary | ICD-10-CM | POA: Diagnosis not present

## 2014-05-18 DIAGNOSIS — I10 Essential (primary) hypertension: Secondary | ICD-10-CM | POA: Diagnosis not present

## 2014-05-18 DIAGNOSIS — M545 Low back pain, unspecified: Secondary | ICD-10-CM | POA: Diagnosis not present

## 2014-05-18 DIAGNOSIS — F411 Generalized anxiety disorder: Secondary | ICD-10-CM | POA: Diagnosis not present

## 2014-06-21 DIAGNOSIS — M47812 Spondylosis without myelopathy or radiculopathy, cervical region: Secondary | ICD-10-CM | POA: Diagnosis not present

## 2014-06-21 DIAGNOSIS — M5030 Other cervical disc degeneration, unspecified cervical region: Secondary | ICD-10-CM | POA: Diagnosis not present

## 2014-06-21 DIAGNOSIS — M542 Cervicalgia: Secondary | ICD-10-CM | POA: Diagnosis not present

## 2014-06-21 DIAGNOSIS — R030 Elevated blood-pressure reading, without diagnosis of hypertension: Secondary | ICD-10-CM | POA: Diagnosis not present

## 2014-06-21 DIAGNOSIS — M545 Low back pain, unspecified: Secondary | ICD-10-CM | POA: Diagnosis not present

## 2014-06-21 DIAGNOSIS — Z6828 Body mass index (BMI) 28.0-28.9, adult: Secondary | ICD-10-CM | POA: Diagnosis not present

## 2014-06-21 DIAGNOSIS — R51 Headache: Secondary | ICD-10-CM | POA: Diagnosis not present

## 2014-06-21 DIAGNOSIS — M5412 Radiculopathy, cervical region: Secondary | ICD-10-CM | POA: Diagnosis not present

## 2014-07-06 DIAGNOSIS — Z23 Encounter for immunization: Secondary | ICD-10-CM | POA: Diagnosis not present

## 2014-07-06 DIAGNOSIS — F411 Generalized anxiety disorder: Secondary | ICD-10-CM | POA: Diagnosis not present

## 2014-07-06 DIAGNOSIS — F410 Panic disorder [episodic paroxysmal anxiety] without agoraphobia: Secondary | ICD-10-CM | POA: Diagnosis not present

## 2014-07-06 DIAGNOSIS — I10 Essential (primary) hypertension: Secondary | ICD-10-CM | POA: Diagnosis not present

## 2014-07-06 DIAGNOSIS — N529 Male erectile dysfunction, unspecified: Secondary | ICD-10-CM | POA: Diagnosis not present

## 2014-07-07 DIAGNOSIS — I10 Essential (primary) hypertension: Secondary | ICD-10-CM | POA: Diagnosis not present

## 2014-07-07 DIAGNOSIS — M25619 Stiffness of unspecified shoulder, not elsewhere classified: Secondary | ICD-10-CM | POA: Diagnosis not present

## 2014-07-07 DIAGNOSIS — M5430 Sciatica, unspecified side: Secondary | ICD-10-CM | POA: Diagnosis not present

## 2014-07-07 DIAGNOSIS — M542 Cervicalgia: Secondary | ICD-10-CM | POA: Diagnosis not present

## 2014-07-12 DIAGNOSIS — M542 Cervicalgia: Secondary | ICD-10-CM | POA: Diagnosis not present

## 2014-07-12 DIAGNOSIS — M5430 Sciatica, unspecified side: Secondary | ICD-10-CM | POA: Diagnosis not present

## 2014-07-12 DIAGNOSIS — M25619 Stiffness of unspecified shoulder, not elsewhere classified: Secondary | ICD-10-CM | POA: Diagnosis not present

## 2014-07-12 DIAGNOSIS — I10 Essential (primary) hypertension: Secondary | ICD-10-CM | POA: Diagnosis not present

## 2014-07-18 DIAGNOSIS — M549 Dorsalgia, unspecified: Secondary | ICD-10-CM | POA: Diagnosis not present

## 2014-07-26 DIAGNOSIS — M5430 Sciatica, unspecified side: Secondary | ICD-10-CM | POA: Diagnosis not present

## 2014-07-26 DIAGNOSIS — M542 Cervicalgia: Secondary | ICD-10-CM | POA: Diagnosis not present

## 2014-07-26 DIAGNOSIS — I10 Essential (primary) hypertension: Secondary | ICD-10-CM | POA: Diagnosis not present

## 2014-07-26 DIAGNOSIS — M25619 Stiffness of unspecified shoulder, not elsewhere classified: Secondary | ICD-10-CM | POA: Diagnosis not present

## 2014-08-01 DIAGNOSIS — H2511 Age-related nuclear cataract, right eye: Secondary | ICD-10-CM | POA: Diagnosis not present

## 2014-08-01 DIAGNOSIS — Z961 Presence of intraocular lens: Secondary | ICD-10-CM | POA: Diagnosis not present

## 2014-08-01 DIAGNOSIS — H04121 Dry eye syndrome of right lacrimal gland: Secondary | ICD-10-CM | POA: Diagnosis not present

## 2014-08-01 DIAGNOSIS — I10 Essential (primary) hypertension: Secondary | ICD-10-CM | POA: Diagnosis not present

## 2014-08-07 DIAGNOSIS — Z961 Presence of intraocular lens: Secondary | ICD-10-CM | POA: Diagnosis not present

## 2014-08-07 DIAGNOSIS — H2511 Age-related nuclear cataract, right eye: Secondary | ICD-10-CM | POA: Diagnosis not present

## 2014-08-07 DIAGNOSIS — H269 Unspecified cataract: Secondary | ICD-10-CM | POA: Diagnosis not present

## 2014-08-14 DIAGNOSIS — M47812 Spondylosis without myelopathy or radiculopathy, cervical region: Secondary | ICD-10-CM | POA: Diagnosis not present

## 2014-08-14 DIAGNOSIS — Z6827 Body mass index (BMI) 27.0-27.9, adult: Secondary | ICD-10-CM | POA: Diagnosis not present

## 2014-08-14 DIAGNOSIS — I10 Essential (primary) hypertension: Secondary | ICD-10-CM | POA: Diagnosis not present

## 2014-08-14 DIAGNOSIS — M5412 Radiculopathy, cervical region: Secondary | ICD-10-CM | POA: Diagnosis not present

## 2014-08-14 DIAGNOSIS — M5030 Other cervical disc degeneration, unspecified cervical region: Secondary | ICD-10-CM | POA: Diagnosis not present

## 2014-09-07 DIAGNOSIS — M545 Low back pain: Secondary | ICD-10-CM | POA: Diagnosis not present

## 2014-09-26 DIAGNOSIS — K149 Disease of tongue, unspecified: Secondary | ICD-10-CM | POA: Diagnosis not present

## 2014-09-26 DIAGNOSIS — R634 Abnormal weight loss: Secondary | ICD-10-CM | POA: Diagnosis not present

## 2014-10-11 DIAGNOSIS — M5416 Radiculopathy, lumbar region: Secondary | ICD-10-CM | POA: Diagnosis not present

## 2014-10-11 DIAGNOSIS — M5440 Lumbago with sciatica, unspecified side: Secondary | ICD-10-CM | POA: Diagnosis not present

## 2014-10-11 DIAGNOSIS — M47812 Spondylosis without myelopathy or radiculopathy, cervical region: Secondary | ICD-10-CM | POA: Diagnosis not present

## 2014-10-11 DIAGNOSIS — M4722 Other spondylosis with radiculopathy, cervical region: Secondary | ICD-10-CM | POA: Diagnosis not present

## 2014-10-11 DIAGNOSIS — M545 Low back pain: Secondary | ICD-10-CM | POA: Diagnosis not present

## 2014-10-16 DIAGNOSIS — H6122 Impacted cerumen, left ear: Secondary | ICD-10-CM | POA: Diagnosis not present

## 2014-10-16 DIAGNOSIS — K146 Glossodynia: Secondary | ICD-10-CM | POA: Diagnosis not present

## 2014-10-16 DIAGNOSIS — E042 Nontoxic multinodular goiter: Secondary | ICD-10-CM | POA: Diagnosis not present

## 2014-10-24 DIAGNOSIS — N5201 Erectile dysfunction due to arterial insufficiency: Secondary | ICD-10-CM | POA: Diagnosis not present

## 2014-10-24 DIAGNOSIS — F411 Generalized anxiety disorder: Secondary | ICD-10-CM | POA: Diagnosis not present

## 2014-10-24 DIAGNOSIS — F17210 Nicotine dependence, cigarettes, uncomplicated: Secondary | ICD-10-CM | POA: Diagnosis not present

## 2014-10-24 DIAGNOSIS — R6882 Decreased libido: Secondary | ICD-10-CM | POA: Diagnosis not present

## 2014-10-24 DIAGNOSIS — I10 Essential (primary) hypertension: Secondary | ICD-10-CM | POA: Diagnosis not present

## 2014-10-24 DIAGNOSIS — D509 Iron deficiency anemia, unspecified: Secondary | ICD-10-CM | POA: Diagnosis not present

## 2014-10-24 DIAGNOSIS — K2970 Gastritis, unspecified, without bleeding: Secondary | ICD-10-CM | POA: Diagnosis not present

## 2014-10-24 DIAGNOSIS — K146 Glossodynia: Secondary | ICD-10-CM | POA: Diagnosis not present

## 2014-11-01 ENCOUNTER — Encounter: Payer: Self-pay | Admitting: Nurse Practitioner

## 2014-11-01 ENCOUNTER — Ambulatory Visit (INDEPENDENT_AMBULATORY_CARE_PROVIDER_SITE_OTHER): Payer: Medicare Other | Admitting: Nurse Practitioner

## 2014-11-01 VITALS — BP 120/86 | HR 80 | Ht 70.0 in | Wt 189.0 lb

## 2014-11-01 DIAGNOSIS — D509 Iron deficiency anemia, unspecified: Secondary | ICD-10-CM

## 2014-11-01 NOTE — Patient Instructions (Signed)
You have been scheduled for an endoscopy. Please follow written instructions given to you at your visit today. If you use inhalers (even only as needed), please bring them with you on the day of your procedure.  Stay on Omeprazole   Avoid NSAIDs like:: Advil, Motrin or Ibuprofen

## 2014-11-01 NOTE — Progress Notes (Signed)
HPI :   Patient is a 54 year old male known to Dr. Fuller Plan. For evaluation of GERD he underwent EGD December 2011. Findings included gastritis and duodenitis. Gastric biopsies showed benign gastric mucosa, no H. 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 His last colonoscopy was December 2011 with findings of ileitis, a small adenomatous polyp and internal hemorrhoids. 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  Patient is referred for evaluation of iron deficiency anemia. Recent hemoglobin 9.4, MCV 67, total iron binding capacity elevated at 540, iron saturation only 7%. Patient has been taking 3 ibuprofen daily for a month or so for headaches. Absolutely no black stool. No bowel changes. No abdominal pain, no nausea, weight is stable  Past Medical History  Diagnosis Date  . Thyroid nodule   . Anxiety   . GERD (gastroesophageal reflux disease)   . Headache(784.0)     2 x/week  . Arthritis     neck, shoulder, left knee  . Hypertension     under control with med., has been on med. x 1 yr.  . 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 Medial meniscus tear 06/2013    left knee  . Limited joint range of motion     cervical fusion 02/2013  . Wears partial dentures     upper  . Leukocytosis, unspecified 11/03/2013  . Anemia     Family History  Problem Relation Age of Onset  . Cancer Father 36    colon ca  . Cancer Sister 61    breast ca   History  Substance Use Topics  . Smoking status: Current Every Day Smoker -- 1.00 packs/day for 25 years    Types: Cigarettes  . Smokeless tobacco: Never Used  . Alcohol Use: Yes     Comment: occasionally   Current Outpatient Prescriptions  Medication Sig Dispense Refill  . albuterol (PROVENTIL HFA;VENTOLIN HFA) 108 (90 BASE) MCG/ACT inhaler Inhale 2 puffs into the lungs every 4 (four) hours as needed for wheezing or shortness of breath  (cough, shortness of breath or wheezing.). 1 Inhaler 1  . atenolol-chlorthalidone (TENORETIC) 100-25 MG per tablet Take 1 tablet by mouth daily. 30 tablet 5  . LORazepam (ATIVAN) 1 MG tablet Take 1 mg by mouth 2 (two) times daily between meals as needed for anxiety.    . methocarbamol (ROBAXIN) 500 MG tablet Take 500 mg by mouth every 8 (eight) hours as needed for muscle spasms.     . Multiple Vitamin (MULTIVITAMIN WITH MINERALS) TABS Take 1 tablet by mouth daily.    Marland Kitchen omeprazole (PRILOSEC) 40 MG capsule Take 1 capsule (40 mg total) by mouth 2 (two) times daily. 60 capsule 5  . oxyCODONE-acetaminophen (PERCOCET) 10-325 MG per tablet Take 1-2 tablets by mouth every 6 (six) hours as needed for pain. 30 tablet 0  . tadalafil (CIALIS) 20 MG tablet Take 20 mg by mouth daily as needed for erectile dysfunction.    . diclofenac sodium (VOLTAREN) 1 % GEL Apply 2 g topically 4 (four) times daily as needed.    . fluticasone (FLONASE) 50 MCG/ACT nasal spray Place 2 sprays into the nose daily. (Patient not taking: Reported on 11/01/2014) 16 g 6  . sildenafil (VIAGRA) 100 MG tablet Take 100 mg by mouth daily as needed for erectile dysfunction.    . [DISCONTINUED] simethicone (MYLICON) 80 MG chewable tablet Chew 80 mg by mouth  every 6 (six) hours as needed for flatulence.     No current facility-administered medications for this visit.   Allergies  Allergen Reactions  . Chantix [Varenicline] Palpitations    Heart race Increased panic attacks  . Morphine Itching  . Penicillins Rash     Review of Systems: Positive for anxiety, back pain, headaches, muscle pain, and night sweats  All other systems reviewed and negative except where noted in HPI.    Physical Exam: BP 120/86 mmHg  Pulse 80  Ht 5\' 10"  (1.778 m)  Wt 189 lb (85.73 kg)  BMI 27.12 kg/m2 Constitutional: Pleasant,well-developed, black male in no acute distress. HEENT: Normocephalic and atraumatic. Conjunctivae are normal. No scleral  icterus. Neck supple.  Cardiovascular: Normal rate, regular rhythm.  Pulmonary/chest: Effort normal and breath sounds normal. No wheezing, rales or rhonchi. Abdominal: Soft, nondistended, nontender. Bowel sounds active throughout. There are no masses palpable. No hepatomegaly. Rectal: scant brown, heme negative stool Extremities: no edema Lymphadenopathy: No cervical adenopathy noted. Neurological: Alert and oriented to person place and time. Skin: Skin is warm and dry. No rashes noted. Psychiatric: Normal mood and affect. Behavior is normal.   ASSESSMENT AND PLAN:  34. 54 year old male referred for iron deficiency anemia. No overt GI bleeding, Hemoccult-negative today. Patient has been taking daily NSAIDs  Rule out recurrent erosive disease/peptic ulcer disease. Recurrent ileitis also a consideration.  Patient has now discontinued NSAIDs.   He is already on a PPI (over a year now).   For further evaluation patient will be scheduled for upper endoscopy. The benefits, risks, and potential complications of EGD with possible biopsies were discussed with the patient and he agrees to proceed.   We discussed starting iron supplements. Patient has tried iron in the past and it caused some upper GI distress. We talked about iron infusions. Patient will talk with his PCP and think more about what he wants to do.  If EGD negative he may require colonoscopy and /or pill camera   2. History of colon polyps. He is s/p remote right hemicolectomy for a tubulovillous adenomatous polyp in 1991. He had one small adenomatous polyp found on last colonoscopy December 2011.  Due for surveillance colonoscopy December 2016 but require one sooner depending on results of EGD.

## 2014-11-02 NOTE — Progress Notes (Signed)
Reviewed and agree with management plan. R/O celiac disease. Check tTG and IgA.  Pricilla Riffle. Fuller Plan, MD [facility]

## 2014-11-07 DIAGNOSIS — I10 Essential (primary) hypertension: Secondary | ICD-10-CM | POA: Diagnosis not present

## 2014-11-07 DIAGNOSIS — E291 Testicular hypofunction: Secondary | ICD-10-CM | POA: Diagnosis not present

## 2014-11-14 DIAGNOSIS — M545 Low back pain: Secondary | ICD-10-CM | POA: Diagnosis not present

## 2014-11-14 DIAGNOSIS — Z6828 Body mass index (BMI) 28.0-28.9, adult: Secondary | ICD-10-CM | POA: Diagnosis not present

## 2014-11-14 DIAGNOSIS — I10 Essential (primary) hypertension: Secondary | ICD-10-CM | POA: Diagnosis not present

## 2014-11-14 DIAGNOSIS — M5412 Radiculopathy, cervical region: Secondary | ICD-10-CM | POA: Diagnosis not present

## 2014-11-14 DIAGNOSIS — M4722 Other spondylosis with radiculopathy, cervical region: Secondary | ICD-10-CM | POA: Diagnosis not present

## 2014-11-14 DIAGNOSIS — M5030 Other cervical disc degeneration, unspecified cervical region: Secondary | ICD-10-CM | POA: Diagnosis not present

## 2014-11-14 DIAGNOSIS — M5416 Radiculopathy, lumbar region: Secondary | ICD-10-CM | POA: Diagnosis not present

## 2014-11-14 DIAGNOSIS — M5440 Lumbago with sciatica, unspecified side: Secondary | ICD-10-CM | POA: Diagnosis not present

## 2014-11-16 ENCOUNTER — Encounter: Payer: Self-pay | Admitting: Gastroenterology

## 2014-11-16 ENCOUNTER — Telehealth: Payer: Self-pay | Admitting: *Deleted

## 2014-11-16 ENCOUNTER — Ambulatory Visit (AMBULATORY_SURGERY_CENTER): Payer: Medicare Other | Admitting: Gastroenterology

## 2014-11-16 VITALS — BP 106/75 | HR 73 | Temp 97.4°F | Resp 37 | Ht 70.0 in | Wt 180.0 lb

## 2014-11-16 DIAGNOSIS — K219 Gastro-esophageal reflux disease without esophagitis: Secondary | ICD-10-CM | POA: Diagnosis not present

## 2014-11-16 DIAGNOSIS — D509 Iron deficiency anemia, unspecified: Secondary | ICD-10-CM

## 2014-11-16 DIAGNOSIS — I10 Essential (primary) hypertension: Secondary | ICD-10-CM | POA: Diagnosis not present

## 2014-11-16 MED ORDER — SODIUM CHLORIDE 0.9 % IV SOLN: 500.0000 mL | INTRAVENOUS | Status: DC

## 2014-11-16 NOTE — Op Note (Signed)
[place]  [address] [place], [address]   ENDOSCOPY PROCEDURE REPORT  PATIENT: [name], [name]  MR#: [id number] BIRTHDATE: Apr 16, 1961 , 58  yrs. old GENDER: male ENDOSCOPIST: Ladene Artist, MD, [facility] PROCEDURE DATE:  11/16/2014 PROCEDURE:  EGD w/ biopsy ASA CLASS:     Class II INDICATIONS:  iron deficiency anemia. MEDICATIONS: Monitored anesthesia care, Propofol 250 mg IV, and lidocaine 40 mg IV TOPICAL ANESTHETIC: none DESCRIPTION OF PROCEDURE: After the risks benefits and alternatives of the procedure were thoroughly explained, informed consent was obtained.  The    endoscope was introduced through the mouth and advanced to the second portion of the duodenum , Without limitations.  The instrument was slowly withdrawn as the mucosa was fully examined.    ESOPHAGUS: The mucosa of the esophagus appeared normal. STOMACH: The mucosa of the stomach appeared normal. DUODENUM: The duodenal mucosa showed no abnormalities in the bulb and 2nd part of the duodenum.  Cold forceps biopsies were taken in the bulb and second portion.  Retroflexed views revealed no abnormalities.   The scope was then withdrawn from the patient and the procedure completed.  COMPLICATIONS: There were no immediate complications.  ENDOSCOPIC IMPRESSION: 1.   The EGD appeared normal  RECOMMENDATIONS: 1.  Await pathology results 2.  My office will schedule a colonoscopy   eSigned:  Ladene Artist, MD, [facility] 11/16/2014 10:07 AM

## 2014-11-16 NOTE — Telephone Encounter (Signed)
I tried to set up this pt's colonoscopy.  Pt's wife isn't aware of pt's schedule; states he will call back to set this up.  I put this on his AVS to remind him to call back to set this up

## 2014-11-16 NOTE — Progress Notes (Signed)
Called to room to assist during endoscopic procedure.  Patient ID and intended procedure confirmed with present staff. Received instructions for my participation in the procedure from the performing physician.  

## 2014-11-16 NOTE — Progress Notes (Signed)
Pt's wife isn't aware of pt's schedule; states he will call back to set this up.  Note forwarded to Adah Salvage RN and Samuella Cota CMA so they are aware of this

## 2014-11-16 NOTE — Progress Notes (Signed)
Stable to RR 

## 2014-11-16 NOTE — Patient Instructions (Signed)
YOU HAD AN ENDOSCOPIC PROCEDURE TODAY AT [facility]: Refer to the procedure report that was given to you for any specific questions about what was found during the examination.  If the procedure report does not answer your questions, please call your gastroenterologist to clarify.  If you requested that your care partner not be given the details of your procedure findings, then the procedure report has been included in a sealed envelope for you to review at your convenience later.  YOU SHOULD EXPECT: Some feelings of bloating in the abdomen. Passage of more gas than usual.  Walking can help get rid of the air that was put into your GI tract during the procedure and reduce the bloating.  DIET: Your first meal following the procedure should be a light meal and then it is ok to progress to your normal diet.  A half-sandwich or bowl of soup is an example of a good first meal.  Heavy or fried foods are harder to digest and may make you feel nauseous or bloated.  Likewise meals heavy in dairy and vegetables can cause extra gas to form and this can also increase the bloating.  Drink plenty of fluids but you should avoid alcoholic beverages for 24 hours.  ACTIVITY: Your care partner should take you home directly after the procedure.  You should plan to take it easy, moving slowly for the rest of the day.  You can resume normal activity the day after the procedure however you should NOT DRIVE or use heavy machinery for 24 hours (because of the sedation medicines used during the test).    SYMPTOMS TO REPORT IMMEDIATELY: A gastroenterologist can be reached at any hour.  During normal business hours, 8:30 AM to 5:00 PM Monday through Friday, call [phone].  After hours and on weekends, please call the GI answering service at [phone] who will take a message and have the physician on call contact you.  Following upper endoscopy (EGD)  Vomiting of blood or coffee ground material  New  chest pain or pain under the shoulder blades  Painful or persistently difficult swallowing  New shortness of breath  Fever of 100F or higher  Black, tarry-looking stools  FOLLOW UP: If any biopsies were taken you will be contacted by phone or by letter within the next 1-3 weeks.  Call your gastroenterologist if you have not heard about the biopsies in 3 weeks.  Our staff will call the home number listed on your records the next business day following your procedure to check on you and address any questions or concerns that you may have at that time regarding the information given to you following your procedure. This is a courtesy call and so if there is no answer at the home number and we have not heard from you through the emergency physician on call, we will assume that you have returned to your regular daily activities without incident.  SIGNATURES/CONFIDENTIALITY: You and/or your care partner have signed paperwork which will be entered into your electronic medical record.  These signatures attest to the fact that that the information above on your After Visit Summary has been reviewed and is understood.  Full responsibility of the confidentiality of this discharge information lies with you and/or your care-partner.  Await pathology  Please continue your normal medications  Please call office back to set up a colonoscopy

## 2014-11-16 NOTE — Telephone Encounter (Signed)
noted 

## 2014-11-20 ENCOUNTER — Encounter: Payer: Self-pay | Admitting: Gastroenterology

## 2014-11-20 ENCOUNTER — Telehealth: Payer: Self-pay | Admitting: *Deleted

## 2014-11-20 NOTE — Telephone Encounter (Signed)
  Follow up Call-  Call back number 11/16/2014  Post procedure Call Back phone  # [phone]  Permission to leave phone message Yes     No answer at # given.  Left message on voicemail.

## 2014-11-27 ENCOUNTER — Encounter: Payer: Self-pay | Admitting: Gastroenterology

## 2014-11-28 ENCOUNTER — Other Ambulatory Visit: Payer: Self-pay | Admitting: Hematology and Oncology

## 2014-11-28 ENCOUNTER — Telehealth: Payer: Self-pay | Admitting: Hematology and Oncology

## 2014-11-28 ENCOUNTER — Telehealth: Payer: Self-pay | Admitting: *Deleted

## 2014-11-28 ENCOUNTER — Other Ambulatory Visit: Payer: Self-pay | Admitting: *Deleted

## 2014-11-28 DIAGNOSIS — D509 Iron deficiency anemia, unspecified: Secondary | ICD-10-CM

## 2014-11-28 NOTE — Telephone Encounter (Signed)
returned Payne call from Rowe @ [facility] [phone] and lvm that desk nurse did not recieve fax for ivf/iron...i lvm with correct fax number

## 2014-11-28 NOTE — Telephone Encounter (Signed)
Confirm appointment for Feb.

## 2014-11-28 NOTE — Telephone Encounter (Signed)
LVM for [name] at [facility] of appts made and called pt notified him of appt on 2/4 at 2:30 pm..  He verbalized understanding.

## 2014-11-30 ENCOUNTER — Telehealth: Payer: Self-pay | Admitting: Hematology and Oncology

## 2014-11-30 ENCOUNTER — Encounter: Payer: Self-pay | Admitting: Hematology and Oncology

## 2014-11-30 ENCOUNTER — Other Ambulatory Visit: Payer: Medicare Other

## 2014-11-30 ENCOUNTER — Other Ambulatory Visit (HOSPITAL_BASED_OUTPATIENT_CLINIC_OR_DEPARTMENT_OTHER): Payer: Medicare Other

## 2014-11-30 ENCOUNTER — Ambulatory Visit (HOSPITAL_BASED_OUTPATIENT_CLINIC_OR_DEPARTMENT_OTHER): Payer: Medicare Other | Admitting: Hematology and Oncology

## 2014-11-30 VITALS — BP 122/84 | HR 86 | Temp 97.6°F | Resp 19 | Ht 70.0 in | Wt 192.5 lb

## 2014-11-30 DIAGNOSIS — D72829 Elevated white blood cell count, unspecified: Secondary | ICD-10-CM

## 2014-11-30 DIAGNOSIS — D509 Iron deficiency anemia, unspecified: Secondary | ICD-10-CM

## 2014-11-30 DIAGNOSIS — F17200 Nicotine dependence, unspecified, uncomplicated: Secondary | ICD-10-CM

## 2014-11-30 DIAGNOSIS — R197 Diarrhea, unspecified: Secondary | ICD-10-CM | POA: Diagnosis not present

## 2014-11-30 DIAGNOSIS — Z720 Tobacco use: Secondary | ICD-10-CM | POA: Diagnosis not present

## 2014-11-30 DIAGNOSIS — D751 Secondary polycythemia: Secondary | ICD-10-CM

## 2014-11-30 HISTORY — DX: Secondary polycythemia: D75.1

## 2014-11-30 LAB — CBC & DIFF AND RETIC
BASO%: 0.9 % (ref 0.0–2.0)
Basophils Absolute: 0.1 10*3/uL (ref 0.0–0.1)
EOS%: 1.2 % (ref 0.0–7.0)
Eosinophils Absolute: 0.1 10*3/uL (ref 0.0–0.5)
HEMATOCRIT: 33.2 % — AB (ref 38.4–49.9)
HGB: 10.1 g/dL — ABNORMAL LOW (ref 13.0–17.1)
IMMATURE RETIC FRACT: 35.3 % — AB (ref 3.00–10.60)
LYMPH%: 26.5 % (ref 14.0–49.0)
MCH: 20.9 pg — AB (ref 27.2–33.4)
MCHC: 30.5 g/dL — ABNORMAL LOW (ref 32.0–36.0)
MCV: 68.7 fL — ABNORMAL LOW (ref 79.3–98.0)
MONO#: 0.8 10*3/uL (ref 0.1–0.9)
MONO%: 6.6 % (ref 0.0–14.0)
NEUT#: 8.2 10*3/uL — ABNORMAL HIGH (ref 1.5–6.5)
NEUT%: 64.8 % (ref 39.0–75.0)
PLATELETS: 393 10*3/uL (ref 140–400)
RBC: 4.83 10*6/uL (ref 4.20–5.82)
RDW: 24.4 % — ABNORMAL HIGH (ref 11.0–14.6)
Retic %: 2.36 % — ABNORMAL HIGH (ref 0.80–1.80)
Retic Ct Abs: 113.99 10*3/uL — ABNORMAL HIGH (ref 34.80–93.90)
WBC: 12.6 10*3/uL — AB (ref 4.0–10.3)
lymph#: 3.3 10*3/uL (ref 0.9–3.3)

## 2014-11-30 LAB — HOLD TUBE, BLOOD BANK

## 2014-11-30 LAB — FERRITIN CHCC: FERRITIN: 11 ng/mL — AB (ref 22–316)

## 2014-11-30 LAB — IRON AND TIBC CHCC
%SAT: 18 % — ABNORMAL LOW (ref 20–55)
Iron: 81 ug/dL (ref 42–163)
TIBC: 454 ug/dL — ABNORMAL HIGH (ref 202–409)
UIBC: 373 ug/dL (ref 117–376)

## 2014-11-30 NOTE — Progress Notes (Signed)
[facility] CONSULT NOTE  Patient Care Team: Dibas Koirala, MD as PCP - General (Family Medicine) Heath Lark, MD as Consulting Physician (Hematology and Oncology)  CHIEF COMPLAINTS/PURPOSE OF CONSULTATION:  Severe iron deficiency anemia  HISTORY OF PRESENTING ILLNESS:  [name] [name] 54 y.o. male is here because of recent diagnosis of severe iron deficiency anemia  He was found to have abnormal CBC from routine blood work monitoring in his physician's office. The patient complained of fatigue. CBC from 10/24/2014 show severe iron deficiency anemia with hemoglobin of 9.4, MCV of 67.1 and platelet count of 458. He denies recent chest pain on exertion, shortness of breath on minimal exertion, pre-syncopal episodes, or palpitations. He complained of profound muscle cramping. The patient had intermittent cramps in his abdomen with occasional diarrhea. He has noticed some mucus in his stool but no blood. He had not noticed any recent bleeding such as epistaxis, hematuria or hematochezia The patient denies over the counter NSAID ingestion. He is not  on antiplatelets agents. His last colonoscopy was in December 2011 which showed some ileitis. He had no prior history or diagnosis of cancer. His age appropriate screening programs are up-to-date. He complain of pica and chews excessive amount of ice and eats a variety of diet. He never donated blood or received blood transfusion The patient was prescribed oral iron supplements and he takes 1 tablet a day with breakfast and he did not tolerate that well. It causes excessive abdominal bloating and symptoms of gastritis  MEDICAL HISTORY:  Past Medical History  Diagnosis Date  . Thyroid nodule   . Anxiety   . GERD (gastroesophageal reflux disease)   . Headache(784.0)     2 x/week  . Arthritis     neck, shoulder, left knee  . Hypertension     under control with med., has been on med. x 1 yr.  . 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 Medial meniscus tear 06/2013    left knee  . Stuffy and runny nose 07/25/2013    clear drainage from nose  . Cough with sputum 07/25/2013    white  . Limited joint range of motion     cervical fusion 02/2013  . Wears partial dentures     upper  . Leukocytosis, unspecified 11/03/2013  . Anemia   . Erythrocytosis 11/30/2014    SURGICAL HISTORY: Past Surgical History  Procedure Laterality Date  . Appendectomy    . Eye surgery Left     cataract  . Anterior cervical decomp/discectomy fusion N/A 03/18/2013    Procedure: ANTERIOR CERVICAL DECOMPRESSION/DISCECTOMY FUSION 3 LEVELS;  Surgeon: Erline Levine, MD;  Location: [facility];  Service: Neurosurgery;  Laterality: N/A;  Anterior Cervical Three-Six  Decompression/Diskectomy/Fusion  . Hemicolectomy Right 1/61/0960    with ileocolic anastomosis  . Small intestine surgery  04/24/2000    adhesiolysis, ileocolic anastomosis  . Circumcision  05/04/2006  . Shoulder arthroscopy w/ rotator cuff repair Left 07/16/2010  . Inguinal hernia repair    . Knee arthroscopy Left 08/01/2013    Procedure: LEFT ARTHROSCOPY KNEE;  Surgeon: Kerin Salen, MD;  Location: [facility];  Service: Orthopedics;  Laterality: Left;  . Upper gastrointestinal endoscopy    . Colonoscopy      SOCIAL HISTORY: History   Social History  . Marital Status: Married    Spouse Name: N/A    Number of Children: N/A  . Years of Education: N/A   Occupational History  .  Not on file.   Social History Main Topics  . Smoking status: Current Every Day Smoker -- 1.00 packs/day for 25 years    Types: Cigarettes  . Smokeless tobacco: Never Used  . Alcohol Use: Yes     Comment: occasionally  . Drug Use: No  . Sexual Activity: Not on file   Other Topics Concern  . Not on file   Social History Narrative    FAMILY HISTORY: Family History  Problem Relation Age of Onset  . Cancer Father 44    colon ca  . Colon cancer Father   . Cancer Sister 50     breast ca    ALLERGIES:  is allergic to chantix; morphine; and penicillins.  MEDICATIONS:  Current Outpatient Prescriptions  Medication Sig Dispense Refill  . albuterol (PROVENTIL HFA;VENTOLIN HFA) 108 (90 BASE) MCG/ACT inhaler Inhale 2 puffs into the lungs every 4 (four) hours as needed for wheezing or shortness of breath (cough, shortness of breath or wheezing.). 1 Inhaler 1  . atenolol-chlorthalidone (TENORETIC) 100-25 MG per tablet Take 1 tablet by mouth daily. 30 tablet 5  . FERREX 150 150 MG capsule Take 150 mg by mouth daily.     Marland Kitchen gabapentin (NEURONTIN) 100 MG capsule Take 100 mg by mouth 2 (two) times daily as needed.     Marland Kitchen LORazepam (ATIVAN) 1 MG tablet Take 1 mg by mouth 2 (two) times daily between meals as needed for anxiety.    . methocarbamol (ROBAXIN) 500 MG tablet Take 500 mg by mouth every 8 (eight) hours as needed for muscle spasms.     . Multiple Vitamin (MULTIVITAMIN WITH MINERALS) TABS Take 1 tablet by mouth daily.    Marland Kitchen omeprazole (PRILOSEC) 40 MG capsule Take 1 capsule (40 mg total) by mouth 2 (two) times daily. 60 capsule 5  . oxyCODONE-acetaminophen (PERCOCET) 10-325 MG per tablet Take 1-2 tablets by mouth every 6 (six) hours as needed for pain. 30 tablet 0  . sildenafil (VIAGRA) 100 MG tablet Take 100 mg by mouth daily as needed for erectile dysfunction.    . tadalafil (CIALIS) 20 MG tablet Take 20 mg by mouth daily as needed for erectile dysfunction.    . [DISCONTINUED] simethicone (MYLICON) 80 MG chewable tablet Chew 80 mg by mouth every 6 (six) hours as needed for flatulence.     No current facility-administered medications for this visit.    REVIEW OF SYSTEMS:   Constitutional: Denies fevers, chills or abnormal night sweats Eyes: Denies blurriness of vision, double vision or watery eyes Ears, nose, mouth, throat, and face: Denies mucositis or sore throat Respiratory: Denies cough, dyspnea or wheezes Cardiovascular: Denies palpitation, chest discomfort or  lower extremity swelling Skin: Denies abnormal skin rashes Lymphatics: Denies new lymphadenopathy or easy bruising Neurological:Denies numbness, tingling or new weaknesses Behavioral/Psych: Mood is stable, no new changes  All other systems were reviewed with the patient and are negative.  PHYSICAL EXAMINATION: ECOG PERFORMANCE STATUS: 1 - Symptomatic but completely ambulatory  Filed Vitals:   11/30/14 1401  BP: 122/84  Pulse: 86  Temp: 97.6 F (36.4 C)  Resp: 19   Filed Weights   11/30/14 1401  Weight: 192 lb 8 oz (87.317 kg)    GENERAL:alert, no distress and comfortable SKIN: skin color, texture, turgor are normal, no rashes or significant lesions EYES: normal, conjunctiva are pink and non-injected, sclera clear OROPHARYNX:no exudate, no erythema and lips, buccal mucosa, and tongue normal  NECK: supple, thyroid normal size, non-tender, without nodularity LYMPH:  no palpable lymphadenopathy in the cervical, axillary or inguinal LUNGS: clear to auscultation and percussion with normal breathing effort HEART: regular rate & rhythm and no murmurs and no lower extremity edema ABDOMEN:abdomen soft, non-tender and normal bowel sounds Musculoskeletal:no cyanosis of digits and no clubbing  PSYCH: alert & oriented x 3 with fluent speech NEURO: no focal motor/sensory deficits  LABORATORY DATA:  I have reviewed the data as listed Recent Results (from the past 2160 hour(s))  CBC & Diff and Retic     Status: Abnormal   Collection Time: 11/30/14  1:57 PM  Result Value Ref Range   WBC 12.6 (H) 4.0 - 10.3 10e3/uL   NEUT# 8.2 (H) 1.5 - 6.5 10e3/uL   HGB 10.1 (L) 13.0 - 17.1 g/dL   HCT 33.2 (L) 38.4 - 49.9 %   Platelets 393 140 - 400 10e3/uL   MCV 68.7 (L) 79.3 - 98.0 fL   MCH 20.9 (L) 27.2 - 33.4 pg   MCHC 30.5 (L) 32.0 - 36.0 g/dL   RBC 4.83 4.20 - 5.82 10e6/uL   RDW 24.4 (H) 11.0 - 14.6 %   lymph# 3.3 0.9 - 3.3 10e3/uL   MONO# 0.8 0.1 - 0.9 10e3/uL   Eosinophils Absolute 0.1 0.0  - 0.5 10e3/uL   Basophils Absolute 0.1 0.0 - 0.1 10e3/uL   NEUT% 64.8 39.0 - 75.0 %   LYMPH% 26.5 14.0 - 49.0 %   MONO% 6.6 0.0 - 14.0 %   EOS% 1.2 0.0 - 7.0 %   BASO% 0.9 0.0 - 2.0 %   Retic % 2.36 (H) 0.80 - 1.80 %   Retic Ct Abs 113.99 (H) 34.80 - 93.90 10e3/uL   Immature Retic Fract 35.30 (H) 3.00 - 10.60 %  Ferritin     Status: Abnormal   Collection Time: 11/30/14  1:57 PM  Result Value Ref Range   Ferritin 11 (L) 22 - 316 ng/ml  Iron and TIBC     Status: Abnormal   Collection Time: 11/30/14  1:57 PM  Result Value Ref Range   Iron 81 42 - 163 ug/dL   TIBC 454 (H) 202 - 409 ug/dL   UIBC 373 117 - 376 ug/dL   %SAT 18 (L) 20 - 55 %  Hold Tube, Blood Bank     Status: None   Collection Time: 11/30/14  1:57 PM  Result Value Ref Range   Hold Tube, Blood Bank Blood Bank Order Cancelled    ASSESSMENT & PLAN:  Anemia, iron deficiency His last colonoscopy in 2011 show ileitis. Recent EGD showed no source of upper GI bleed. I recommend he keeps his appointment for colonoscopy as I suspect the source of blood losses in his colon. In the meantime, I recommend iron infusion. The most likely cause of his anemia is due to chronic blood loss. We discussed some of the risks, benefits, and alternatives of intravenous iron infusions. The patient is symptomatic from anemia and the iron level is critically low. He tolerated oral iron supplement poorly and desires to achieved higher levels of iron faster for adequate hematopoesis. Some of the side-effects to be expected including risks of infusion reactions, phlebitis, headaches, nausea and fatigue.  The patient is willing to proceed. Patient education material was dispensed.  Goal is to keep ferritin level greater than 50    Smoker I spent some time counseling the patient the importance of tobacco cessation. he is currently attempting to quit on his own  I gave him patient education handout  and encouraged him to sign up for smoking cessation  class.    Leukocytosis This is likely reactive in nature related to smoking. I recommend observation only.   Diarrhea He has change in bowel habits and findings of ilietis from colonoscopy in 2011. He has strong family history of colon cancer. I recommend he follows closely with GI for colonoscopy in the near future.        All questions were answered. The patient knows to call the clinic with any problems, questions or concerns. I spent 40 minutes counseling the patient face to face. The total time spent in the appointment was 55 minutes and more than 50% was on counseling.     [place], [place], MD@ 3:57 PM 11/30/2014

## 2014-11-30 NOTE — Telephone Encounter (Signed)
gv and printed appt sched and avs for pt for Feb and April

## 2014-11-30 NOTE — Assessment & Plan Note (Signed)
He has change in bowel habits and findings of ilietis from colonoscopy in 2011. He has strong family history of colon cancer. I recommend he follows closely with GI for colonoscopy in the near future.

## 2014-11-30 NOTE — Assessment & Plan Note (Signed)
This is likely reactive in nature related to smoking. I recommend observation only.

## 2014-11-30 NOTE — Assessment & Plan Note (Signed)
His last colonoscopy in 2011 show ileitis. Recent EGD showed no source of upper GI bleed. I recommend he keeps his appointment for colonoscopy as I suspect the source of blood losses in his colon. In the meantime, I recommend iron infusion. The most likely cause of his anemia is due to chronic blood loss. We discussed some of the risks, benefits, and alternatives of intravenous iron infusions. The patient is symptomatic from anemia and the iron level is critically low. He tolerated oral iron supplement poorly and desires to achieved higher levels of iron faster for adequate hematopoesis. Some of the side-effects to be expected including risks of infusion reactions, phlebitis, headaches, nausea and fatigue.  The patient is willing to proceed. Patient education material was dispensed.  Goal is to keep ferritin level greater than 50

## 2014-11-30 NOTE — Assessment & Plan Note (Signed)
I spent some time counseling the patient the importance of tobacco cessation. he is currently attempting to quit on his own  I gave him patient education handout and encouraged him to sign up for smoking cessation class.  

## 2014-12-01 ENCOUNTER — Ambulatory Visit (HOSPITAL_BASED_OUTPATIENT_CLINIC_OR_DEPARTMENT_OTHER): Payer: Medicare Other

## 2014-12-01 ENCOUNTER — Encounter: Payer: Self-pay | Admitting: *Deleted

## 2014-12-01 ENCOUNTER — Telehealth: Payer: Self-pay | Admitting: *Deleted

## 2014-12-01 ENCOUNTER — Other Ambulatory Visit: Payer: Self-pay | Admitting: Hematology and Oncology

## 2014-12-01 DIAGNOSIS — D509 Iron deficiency anemia, unspecified: Secondary | ICD-10-CM | POA: Diagnosis not present

## 2014-12-01 MED ORDER — SODIUM CHLORIDE 0.9 % IV SOLN
Freq: Once | INTRAVENOUS | Status: AC
  Administered 2014-12-01: 14:00:00 via INTRAVENOUS

## 2014-12-01 MED ORDER — FERUMOXYTOL INJECTION 510 MG/17 ML
510.0000 mg | Freq: Once | INTRAVENOUS | Status: AC
Start: 2014-12-01 — End: 2014-12-01
  Administered 2014-12-01: 510 mg via INTRAVENOUS
  Filled 2014-12-01: qty 17

## 2014-12-01 NOTE — Telephone Encounter (Signed)
Per chemo RN I have rescheduled appt

## 2014-12-01 NOTE — Patient Instructions (Signed)

## 2014-12-07 ENCOUNTER — Ambulatory Visit (AMBULATORY_SURGERY_CENTER): Payer: Self-pay

## 2014-12-07 VITALS — Ht 70.5 in | Wt 192.0 lb

## 2014-12-07 DIAGNOSIS — D509 Iron deficiency anemia, unspecified: Secondary | ICD-10-CM

## 2014-12-07 MED ORDER — MOVIPREP 100 G PO SOLR: 1.0000 | Freq: Once | ORAL | Status: DC

## 2014-12-07 NOTE — Progress Notes (Signed)
No allergies to eggs or soy No past problems with anesthesia No home oxygen No diet/weight loss meds  Has email  Emmi instructions given for colonoscopy 

## 2014-12-08 ENCOUNTER — Ambulatory Visit: Payer: Medicare Other

## 2014-12-08 ENCOUNTER — Ambulatory Visit (HOSPITAL_BASED_OUTPATIENT_CLINIC_OR_DEPARTMENT_OTHER): Payer: Medicare Other

## 2014-12-08 DIAGNOSIS — D509 Iron deficiency anemia, unspecified: Secondary | ICD-10-CM | POA: Diagnosis not present

## 2014-12-08 MED ORDER — SODIUM CHLORIDE 0.9 % IV SOLN
510.0000 mg | Freq: Once | INTRAVENOUS | Status: AC
  Administered 2014-12-08: 510 mg via INTRAVENOUS
  Filled 2014-12-08: qty 17

## 2014-12-08 MED ORDER — SODIUM CHLORIDE 0.9 % IV SOLN
Freq: Once | INTRAVENOUS | Status: AC
  Administered 2014-12-08: 12:00:00 via INTRAVENOUS

## 2014-12-08 NOTE — Patient Instructions (Signed)

## 2014-12-14 ENCOUNTER — Encounter: Payer: Self-pay | Admitting: Gastroenterology

## 2014-12-14 ENCOUNTER — Ambulatory Visit (AMBULATORY_SURGERY_CENTER): Payer: Medicare Other | Admitting: Gastroenterology

## 2014-12-14 VITALS — BP 108/67 | HR 75 | Temp 97.4°F | Resp 20 | Ht 70.0 in | Wt 192.0 lb

## 2014-12-14 DIAGNOSIS — D125 Benign neoplasm of sigmoid colon: Secondary | ICD-10-CM | POA: Diagnosis not present

## 2014-12-14 DIAGNOSIS — D124 Benign neoplasm of descending colon: Secondary | ICD-10-CM

## 2014-12-14 DIAGNOSIS — D509 Iron deficiency anemia, unspecified: Secondary | ICD-10-CM | POA: Diagnosis not present

## 2014-12-14 DIAGNOSIS — D649 Anemia, unspecified: Secondary | ICD-10-CM | POA: Diagnosis not present

## 2014-12-14 DIAGNOSIS — Z86010 Personal history of colonic polyps: Secondary | ICD-10-CM

## 2014-12-14 DIAGNOSIS — J449 Chronic obstructive pulmonary disease, unspecified: Secondary | ICD-10-CM | POA: Diagnosis not present

## 2014-12-14 MED ORDER — SODIUM CHLORIDE 0.9 % IV SOLN: 500.0000 mL | INTRAVENOUS | Status: DC

## 2014-12-14 NOTE — Progress Notes (Signed)
Called to room to assist during endoscopic procedure.  Patient ID and intended procedure confirmed with present staff. Received instructions for my participation in the procedure from the performing physician.  

## 2014-12-14 NOTE — Progress Notes (Signed)
A/ox3 pleased with MAC, report to April RN 

## 2014-12-14 NOTE — Op Note (Signed)
[place]  [address] [place], [id number]   COLONOSCOPY PROCEDURE REPORT  PATIENT: [name], [name]  MR#: [id number] BIRTHDATE: Dec 14, 1960 , 54  yrs. old GENDER: male ENDOSCOPIST: Ladene Artist, MD, [facility] PROCEDURE DATE:  12/14/2014 PROCEDURE:   Colonoscopy with snare polypectomy First Screening Colonoscopy - Avg.  risk and is 50 yrs.  old or older - No.  Prior Negative Screening - Now for repeat screening. N/A  History of Adenoma - Now for follow-up colonoscopy & has been > or = to 3 yrs.  Yes hx of adenoma.  Has been 3 or more years since last colonoscopy.  Polyps Removed Today? Yes. ASA CLASS:   Class II INDICATIONS:follow up of adenomatous colonic polyp(s) and iron deficiency anemia. MEDICATIONS: Monitored anesthesia care and Propofol 300 mg IV DESCRIPTION OF PROCEDURE:   After the risks benefits and alternatives of the procedure were thoroughly explained, informed consent was obtained.  The digital rectal exam revealed no abnormalities of the rectum.   The LB [id number]  endoscope was introduced through the anus and advanced to the terminal ileum which was intubated for a short distance. No adverse events experienced.   The quality of the prep was good, using MoviPrep The instrument was then slowly withdrawn as the colon was fully examined.    COLON FINDINGS: There was evidence of a prior ileocolonic surgical anastomosis and segmental colon resection in the ascending colon. The examined neoterminal ileum appeared to be normal.  Two sessile polyps measuring 6 mm in size were found in the sigmoid colon and descending colon.  Polypectomies were performed with a cold snare. The resection was complete, the polyp tissue was completely retrieved and sent to histology.   The examination was otherwise normal.  Retroflexed views revealed internal Grade I hemorrhoids. The time to cecum=1 minutes 02 seconds.  Withdrawal time=9 minutes 09 seconds.  The scope  was withdrawn and the procedure completed. COMPLICATIONS: There were no immediate complications.  ENDOSCOPIC IMPRESSION: 1.   Prior ileocolonic surgical anastomosis and segmental colon resection in the ascending colon 2.   Two sessile polyps in the sigmoid colon and descending colon; polypectomies performed with a cold snare 5.   Grade l internal hemorrhoids  RECOMMENDATIONS: 1.  Await pathology results 2.  Repeat Colonoscopy in 5 years.  eSigned:  Ladene Artist, MD, [facility] 12/14/2014 11:13 AM

## 2014-12-14 NOTE — Progress Notes (Signed)
Dr did not wait for family to come into recovery, he will come back after next case.

## 2014-12-14 NOTE — Patient Instructions (Signed)

## 2014-12-15 ENCOUNTER — Encounter: Payer: Medicare Other | Admitting: Gastroenterology

## 2014-12-15 ENCOUNTER — Telehealth: Payer: Self-pay | Admitting: *Deleted

## 2014-12-15 NOTE — Telephone Encounter (Signed)
Left message that we called for f/u 

## 2014-12-18 ENCOUNTER — Ambulatory Visit: Payer: Medicare Other

## 2014-12-19 ENCOUNTER — Encounter: Payer: Self-pay | Admitting: Gastroenterology

## 2015-01-03 ENCOUNTER — Encounter: Payer: Medicare Other | Admitting: Gastroenterology

## 2015-01-23 DIAGNOSIS — M546 Pain in thoracic spine: Secondary | ICD-10-CM | POA: Diagnosis not present

## 2015-01-31 ENCOUNTER — Other Ambulatory Visit: Payer: Medicare Other

## 2015-01-31 ENCOUNTER — Ambulatory Visit: Payer: Medicare Other | Admitting: Hematology and Oncology

## 2015-02-06 DIAGNOSIS — M5440 Lumbago with sciatica, unspecified side: Secondary | ICD-10-CM | POA: Diagnosis not present

## 2015-02-06 DIAGNOSIS — G4489 Other headache syndrome: Secondary | ICD-10-CM | POA: Diagnosis not present

## 2015-02-06 DIAGNOSIS — M791 Myalgia: Secondary | ICD-10-CM | POA: Diagnosis not present

## 2015-02-15 DIAGNOSIS — Z136 Encounter for screening for cardiovascular disorders: Secondary | ICD-10-CM | POA: Diagnosis not present

## 2015-02-15 DIAGNOSIS — I10 Essential (primary) hypertension: Secondary | ICD-10-CM | POA: Diagnosis not present

## 2015-02-15 DIAGNOSIS — Z79899 Other long term (current) drug therapy: Secondary | ICD-10-CM | POA: Diagnosis not present

## 2015-02-15 DIAGNOSIS — D649 Anemia, unspecified: Secondary | ICD-10-CM | POA: Diagnosis not present

## 2015-02-15 DIAGNOSIS — Z0001 Encounter for general adult medical examination with abnormal findings: Secondary | ICD-10-CM | POA: Diagnosis not present

## 2015-02-15 DIAGNOSIS — Z125 Encounter for screening for malignant neoplasm of prostate: Secondary | ICD-10-CM | POA: Diagnosis not present

## 2015-02-15 DIAGNOSIS — F17210 Nicotine dependence, cigarettes, uncomplicated: Secondary | ICD-10-CM | POA: Diagnosis not present

## 2015-02-15 DIAGNOSIS — K219 Gastro-esophageal reflux disease without esophagitis: Secondary | ICD-10-CM | POA: Diagnosis not present

## 2015-02-15 DIAGNOSIS — E041 Nontoxic single thyroid nodule: Secondary | ICD-10-CM | POA: Diagnosis not present

## 2015-02-15 DIAGNOSIS — M5030 Other cervical disc degeneration, unspecified cervical region: Secondary | ICD-10-CM | POA: Diagnosis not present

## 2015-02-15 DIAGNOSIS — F411 Generalized anxiety disorder: Secondary | ICD-10-CM | POA: Diagnosis not present

## 2015-02-19 ENCOUNTER — Encounter: Payer: Self-pay | Admitting: Gastroenterology

## 2015-02-20 ENCOUNTER — Other Ambulatory Visit: Payer: Self-pay | Admitting: Family Medicine

## 2015-02-20 DIAGNOSIS — E041 Nontoxic single thyroid nodule: Secondary | ICD-10-CM

## 2015-03-05 ENCOUNTER — Encounter (HOSPITAL_COMMUNITY): Payer: Self-pay | Admitting: Emergency Medicine

## 2015-03-05 ENCOUNTER — Other Ambulatory Visit (HOSPITAL_COMMUNITY): Payer: Self-pay

## 2015-03-05 ENCOUNTER — Emergency Department (HOSPITAL_COMMUNITY): Payer: Medicare Other

## 2015-03-05 ENCOUNTER — Observation Stay (HOSPITAL_COMMUNITY)
Admission: EM | Admit: 2015-03-05 | Discharge: 2015-03-06 | Disposition: A | Discharge status: Home / Self Care | Payer: Medicare Other | Attending: Cardiology | Admitting: Cardiology

## 2015-03-05 DIAGNOSIS — G8929 Other chronic pain: Secondary | ICD-10-CM

## 2015-03-05 DIAGNOSIS — J449 Chronic obstructive pulmonary disease, unspecified: Secondary | ICD-10-CM | POA: Diagnosis present

## 2015-03-05 DIAGNOSIS — F419 Anxiety disorder, unspecified: Secondary | ICD-10-CM | POA: Insufficient documentation

## 2015-03-05 DIAGNOSIS — M94262 Chondromalacia, left knee: Secondary | ICD-10-CM | POA: Diagnosis not present

## 2015-03-05 DIAGNOSIS — Z862 Personal history of diseases of the blood and blood-forming organs and certain disorders involving the immune mechanism: Secondary | ICD-10-CM | POA: Diagnosis not present

## 2015-03-05 DIAGNOSIS — Z720 Tobacco use: Secondary | ICD-10-CM | POA: Diagnosis not present

## 2015-03-05 DIAGNOSIS — E041 Nontoxic single thyroid nodule: Secondary | ICD-10-CM | POA: Diagnosis not present

## 2015-03-05 DIAGNOSIS — Z98811 Dental restoration status: Secondary | ICD-10-CM | POA: Diagnosis not present

## 2015-03-05 DIAGNOSIS — R0789 Other chest pain: Principal | ICD-10-CM | POA: Diagnosis present

## 2015-03-05 DIAGNOSIS — Z880 Allergy status to penicillin: Secondary | ICD-10-CM | POA: Diagnosis not present

## 2015-03-05 DIAGNOSIS — E876 Hypokalemia: Secondary | ICD-10-CM

## 2015-03-05 DIAGNOSIS — R079 Chest pain, unspecified: Secondary | ICD-10-CM | POA: Diagnosis not present

## 2015-03-05 DIAGNOSIS — E785 Hyperlipidemia, unspecified: Secondary | ICD-10-CM

## 2015-03-05 DIAGNOSIS — K219 Gastro-esophageal reflux disease without esophagitis: Secondary | ICD-10-CM | POA: Insufficient documentation

## 2015-03-05 DIAGNOSIS — I10 Essential (primary) hypertension: Secondary | ICD-10-CM | POA: Diagnosis present

## 2015-03-05 DIAGNOSIS — Z79899 Other long term (current) drug therapy: Secondary | ICD-10-CM | POA: Diagnosis not present

## 2015-03-05 DIAGNOSIS — M159 Polyosteoarthritis, unspecified: Secondary | ICD-10-CM | POA: Insufficient documentation

## 2015-03-05 DIAGNOSIS — R072 Precordial pain: Secondary | ICD-10-CM | POA: Diagnosis not present

## 2015-03-05 DIAGNOSIS — M542 Cervicalgia: Secondary | ICD-10-CM

## 2015-03-05 HISTORY — DX: Tobacco use: Z72.0

## 2015-03-05 HISTORY — DX: Essential (primary) hypertension: I10

## 2015-03-05 LAB — CREATININE, SERUM
Creatinine, Ser: 1.01 mg/dL (ref 0.61–1.24)
GFR calc Af Amer: >60 mL/min (ref >60)
GFR calc non Af Amer: >60 mL/min (ref >60)

## 2015-03-05 LAB — CBC
HCT: 40.9 % (ref 39.0–52.0)
HEMATOCRIT: 42.2 % (ref 39.0–52.0)
HEMOGLOBIN: 13.6 g/dL (ref 13.0–17.0)
Hemoglobin: 14.3 g/dL (ref 13.0–17.0)
MCH: 27.3 pg (ref 26.0–34.0)
MCH: 26.7 pg (ref 26.0–34.0)
MCHC: 33.9 g/dL (ref 30.0–36.0)
MCHC: 33.3 g/dL (ref 30.0–36.0)
MCV: 80.7 fL (ref 78.0–100.0)
MCV: 80.2 fL (ref 78.0–100.0)
Platelets: 338 10*3/uL (ref 150–400)
Platelets: 326 10*3/uL (ref 150–400)
RBC: 5.23 MIL/uL (ref 4.22–5.81)
RBC: 5.1 MIL/uL (ref 4.22–5.81)
RDW: 16.6 % — AB (ref 11.5–15.5)
RDW: 16.4 % — ABNORMAL HIGH (ref 11.5–15.5)
WBC: 13.8 10*3/uL — ABNORMAL HIGH (ref 4.0–10.5)
WBC: 10.2 10*3/uL (ref 4.0–10.5)

## 2015-03-05 LAB — BASIC METABOLIC PANEL
ANION GAP: 10 (ref 5–15)
BUN: 12 mg/dL (ref 6–20)
CALCIUM: 9.5 mg/dL (ref 8.9–10.3)
CO2: 24 mmol/L (ref 22–32)
CREATININE: 0.88 mg/dL (ref 0.61–1.24)
Chloride: 104 mmol/L (ref 101–111)
GFR calc non Af Amer: >60 mL/min (ref >60)
Glucose, Bld: 98 mg/dL (ref 70–99)
POTASSIUM: 3.4 mmol/L — AB (ref 3.5–5.1)
Sodium: 138 mmol/L (ref 135–145)

## 2015-03-05 LAB — I-STAT TROPONIN, ED: TROPONIN I, POC: 0 ng/mL (ref 0.00–0.08)

## 2015-03-05 LAB — D-DIMER, QUANTITATIVE: D-Dimer, Quant: <0.27 ug/mL-FEU (ref 0.00–0.48)

## 2015-03-05 LAB — TROPONIN I

## 2015-03-05 LAB — TSH: TSH: 0.327 u[IU]/mL — ABNORMAL LOW (ref 0.350–4.500)

## 2015-03-05 MED ORDER — NITROGLYCERIN 0.4 MG SL SUBL: 0.4000 mg | SUBLINGUAL_TABLET | SUBLINGUAL | Status: DC | PRN

## 2015-03-05 MED ORDER — ACETAMINOPHEN 325 MG PO TABS: 650.0000 mg | ORAL_TABLET | ORAL | Status: DC | PRN

## 2015-03-05 MED ORDER — SODIUM CHLORIDE 0.9 % IJ SOLN: 3.0000 mL | Freq: Two times a day (BID) | INTRAMUSCULAR | Status: DC

## 2015-03-05 MED ORDER — SODIUM CHLORIDE 0.9 % IJ SOLN: 3.0000 mL | INTRAMUSCULAR | Status: DC | PRN

## 2015-03-05 MED ORDER — NITROGLYCERIN 0.4 MG SL SUBL
0.4000 mg | SUBLINGUAL_TABLET | SUBLINGUAL | Status: AC | PRN
Start: 2015-03-05 — End: 2015-03-05
  Administered 2015-03-05 (×3): 0.4 mg via SUBLINGUAL
  Filled 2015-03-05 (×3): qty 1

## 2015-03-05 MED ORDER — LORAZEPAM 1 MG PO TABS
1.0000 mg | ORAL_TABLET | Freq: Two times a day (BID) | ORAL | Status: DC | PRN
  Administered 2015-03-05: 1 mg via ORAL
  Filled 2015-03-05: qty 1

## 2015-03-05 MED ORDER — ATENOLOL 25 MG PO TABS
50.0000 mg | ORAL_TABLET | Freq: Every day | ORAL | Status: DC
  Filled 2015-03-05: qty 2

## 2015-03-05 MED ORDER — ACETAMINOPHEN 500 MG PO TABS
1000.0000 mg | ORAL_TABLET | Freq: Once | ORAL | Status: AC
  Administered 2015-03-05: 1000 mg via ORAL
  Filled 2015-03-05: qty 2

## 2015-03-05 MED ORDER — SODIUM CHLORIDE 0.9 % IV SOLN: 250.0000 mL | INTRAVENOUS | Status: DC | PRN

## 2015-03-05 MED ORDER — PANTOPRAZOLE SODIUM 40 MG PO TBEC
40.0000 mg | DELAYED_RELEASE_TABLET | Freq: Every day | ORAL | Status: DC
  Filled 2015-03-05: qty 1

## 2015-03-05 MED ORDER — ASPIRIN EC 325 MG PO TBEC
325.0000 mg | DELAYED_RELEASE_TABLET | Freq: Once | ORAL | Status: AC
  Administered 2015-03-05: 325 mg via ORAL
  Filled 2015-03-05: qty 1

## 2015-03-05 MED ORDER — ONDANSETRON HCL 4 MG/2ML IJ SOLN: 4.0000 mg | Freq: Four times a day (QID) | INTRAMUSCULAR | Status: DC | PRN

## 2015-03-05 MED ORDER — HEPARIN SODIUM (PORCINE) 5000 UNIT/ML IJ SOLN: 5000.0000 [IU] | Freq: Three times a day (TID) | INTRAMUSCULAR | Status: DC

## 2015-03-05 MED ORDER — ASPIRIN EC 81 MG PO TBEC: 81.0000 mg | DELAYED_RELEASE_TABLET | Freq: Every day | ORAL | Status: DC

## 2015-03-05 MED ORDER — NICOTINE 21 MG/24HR TD PT24
21.0000 mg | MEDICATED_PATCH | Freq: Every day | TRANSDERMAL | Status: DC
Start: 2015-03-05 — End: 2015-03-06
  Administered 2015-03-05: 21 mg via TRANSDERMAL
  Filled 2015-03-05: qty 1

## 2015-03-05 MED ORDER — ADULT MULTIVITAMIN W/MINERALS CH
1.0000 | ORAL_TABLET | Freq: Every day | ORAL | Status: DC
  Administered 2015-03-05: 1 via ORAL
  Filled 2015-03-05: qty 1

## 2015-03-05 MED ORDER — GABAPENTIN 100 MG PO CAPS: 100.0000 mg | ORAL_CAPSULE | Freq: Two times a day (BID) | ORAL | Status: DC | PRN

## 2015-03-05 MED ORDER — CHLORTHALIDONE 25 MG PO TABS
25.0000 mg | ORAL_TABLET | Freq: Every day | ORAL | Status: DC
  Filled 2015-03-05: qty 1

## 2015-03-05 MED ORDER — KETOROLAC TROMETHAMINE 30 MG/ML IJ SOLN
30.0000 mg | Freq: Three times a day (TID) | INTRAMUSCULAR | Status: DC | PRN
  Administered 2015-03-05: 30 mg via INTRAVENOUS
  Filled 2015-03-05: qty 1

## 2015-03-05 MED ORDER — METHOCARBAMOL 500 MG PO TABS: 500.0000 mg | ORAL_TABLET | Freq: Three times a day (TID) | ORAL | Status: DC | PRN

## 2015-03-05 MED ORDER — OXYCODONE HCL 5 MG PO TABS
10.0000 mg | ORAL_TABLET | Freq: Four times a day (QID) | ORAL | Status: DC | PRN
  Administered 2015-03-05: 10 mg via ORAL
  Filled 2015-03-05: qty 2

## 2015-03-05 MED ORDER — OXYCODONE-ACETAMINOPHEN 10-325 MG PO TABS: 2.0000 | ORAL_TABLET | Freq: Four times a day (QID) | ORAL | Status: DC | PRN

## 2015-03-05 MED ORDER — ATORVASTATIN CALCIUM 80 MG PO TABS
80.0000 mg | ORAL_TABLET | Freq: Every day | ORAL | Status: DC
  Filled 2015-03-05: qty 1

## 2015-03-05 MED ORDER — CARISOPRODOL 350 MG PO TABS
350.0000 mg | ORAL_TABLET | Freq: Three times a day (TID) | ORAL | Status: DC | PRN
  Filled 2015-03-05: qty 1

## 2015-03-05 MED ORDER — OXYCODONE-ACETAMINOPHEN 5-325 MG PO TABS
2.0000 | ORAL_TABLET | Freq: Four times a day (QID) | ORAL | Status: DC | PRN
  Administered 2015-03-05: 2 via ORAL
  Filled 2015-03-05: qty 2

## 2015-03-05 MED ORDER — KETOROLAC TROMETHAMINE 30 MG/ML IJ SOLN
30.0000 mg | Freq: Once | INTRAMUSCULAR | Status: AC
  Administered 2015-03-05: 30 mg via INTRAVENOUS
  Filled 2015-03-05: qty 1

## 2015-03-05 MED ORDER — ATENOLOL-CHLORTHALIDONE 50-25 MG PO TABS: 1.0000 | ORAL_TABLET | Freq: Every day | ORAL | Status: DC

## 2015-03-05 MED ORDER — OXYCODONE-ACETAMINOPHEN 5-325 MG PO TABS
1.0000 | ORAL_TABLET | Freq: Once | ORAL | Status: AC
  Administered 2015-03-05: 1 via ORAL
  Filled 2015-03-05: qty 1

## 2015-03-05 NOTE — ED Notes (Signed)
Patient states that his chest pain has "completely gone away".

## 2015-03-05 NOTE — ED Notes (Signed)
Pt given turkey sandwich and water

## 2015-03-05 NOTE — ED Notes (Signed)
Patient c/o chest pressure and sharp intermittent pains.  Pain sits in middle of the chest and radiates to left side of his chest and shoulder.

## 2015-03-05 NOTE — ED Provider Notes (Signed)
CSN: [id number]     Arrival date & time 03/05/15  [id number] History   First MD Initiated Contact with Patient 03/05/15 1016     Chief Complaint  Patient presents with  . Chest Pain   CHUKWUEBUKA CHURCHILL is a 54 y.o. male with a past medical history significant for hypertension, GERD, headaches, and chronic neck pain who presents with chest pain and shortness of breath. The patient reports that beginning last night, the patient experienced a pressure-like chest pain in his central chest that radiated down his left arm. The patient reports that it would come and go lasting approximately 10-20 minutes per episode. The patient denied any association with exertion but he did report shortness of breath, nausea, and lightheadedness. The patient denied diaphoresis, leg swelling, leg pain or history of heart disease. The patient denies any family members having heart disease. The patient does report smoking. The patient denies any palpitations, sensations of tachycardia, or any other symptoms on arrival. The patient reported that his chest pain was a 7 or 8 out of 10 in severity on arrival.   (Consider location/radiation/quality/duration/timing/severity/associated sxs/prior Treatment) Patient is a 54 y.o. male presenting with chest pain. The history is provided by the patient and medical records. No language interpreter was used.  Chest Pain Pain location:  Substernal area Pain quality: crushing, pressure, sharp and tightness   Pain radiates to:  L shoulder and L arm Pain radiates to the back: yes   Pain severity:  Severe Onset quality:  Gradual Duration:  1 day Timing:  Intermittent Progression:  Waxing and waning Chronicity:  New Context: at rest   Context: no trauma   Relieved by:  Nitroglycerin Worsened by:  Deep breathing Ineffective treatments:  None tried Associated symptoms: shortness of breath   Associated symptoms: no abdominal pain, no anxiety, no back pain, no cough, no diaphoresis, no dizziness,  no dysphagia, no fever, no headache, no nausea, no numbness, no palpitations and not vomiting   Risk factors: hypertension, male sex and smoking   Risk factors: no coronary artery disease and no prior DVT/PE     Past Medical History  Diagnosis Date  . Thyroid nodule   . Anxiety   . GERD (gastroesophageal reflux disease)   . Headache(784.0)     2 x/week  . Arthritis     neck, shoulder, left knee  . Hypertension     under control with med., has been on med. x 1 yr.  . 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 Medial meniscus tear 06/2013    left knee  . Stuffy and runny nose 07/25/2013    clear drainage from nose  . Cough with sputum 07/25/2013    white  . Limited joint range of motion     cervical fusion 02/2013  . Wears partial dentures     upper  . Leukocytosis, unspecified 11/03/2013  . Anemia   . Erythrocytosis 11/30/2014   Past Surgical History  Procedure Laterality Date  . Appendectomy    . Eye surgery Left     cataract  . Anterior cervical decomp/discectomy fusion N/A 03/18/2013    Procedure: ANTERIOR CERVICAL DECOMPRESSION/DISCECTOMY FUSION 3 LEVELS;  Surgeon: Erline Levine, MD;  Location: [facility];  Service: Neurosurgery;  Laterality: N/A;  Anterior Cervical Three-Six  Decompression/Diskectomy/Fusion  . Hemicolectomy Right 2/95/2841    with ileocolic anastomosis  . Small intestine surgery  04/24/2000    adhesiolysis, ileocolic anastomosis  . Circumcision  05/04/2006  .  Shoulder arthroscopy w/ rotator cuff repair Left 07/16/2010  . Inguinal hernia repair    . Knee arthroscopy Left 08/01/2013    Procedure: LEFT ARTHROSCOPY KNEE;  Surgeon: Kerin Salen, MD;  Location: [facility];  Service: Orthopedics;  Laterality: Left;  . Upper gastrointestinal endoscopy    . Colonoscopy     Family History  Problem Relation Age of Onset  . Cancer Father 31    colon ca  . Colon cancer Father   . Cancer Sister 109    breast ca   History  Substance Use Topics  .  Smoking status: Current Every Day Smoker -- 1.00 packs/day for 25 years    Types: Cigarettes  . Smokeless tobacco: Never Used  . Alcohol Use: Yes     Comment: occasionally    Review of Systems  Constitutional: Negative for fever, chills and diaphoresis.  HENT: Negative for congestion and trouble swallowing.   Respiratory: Positive for shortness of breath. Negative for cough, choking, chest tightness, wheezing and stridor.   Cardiovascular: Positive for chest pain. Negative for palpitations.  Gastrointestinal: Negative for nausea, vomiting, abdominal pain, diarrhea and constipation.  Genitourinary: Negative for dysuria.  Musculoskeletal: Negative for back pain, neck pain and neck stiffness.  Skin: Negative for rash and wound.  Neurological: Negative for dizziness, numbness and headaches.  Psychiatric/Behavioral: Negative for agitation.  All other systems reviewed and are negative.     Allergies  Chantix; Morphine; and Penicillins  Home Medications   Prior to Admission medications   Medication Sig Start Date End Date Taking? Authorizing Provider  atenolol-chlorthalidone (TENORETIC) 100-25 MG per tablet Take 1 tablet by mouth daily. 07/22/13   Theodis Blaze, MD  FERREX 150 150 MG capsule  11/24/14   Historical Provider, MD  gabapentin (NEURONTIN) 100 MG capsule Take 100 mg by mouth 2 (two) times daily as needed.  11/14/14   Historical Provider, MD  LORazepam (ATIVAN) 1 MG tablet Take 1 mg by mouth 2 (two) times daily between meals as needed for anxiety.    Historical Provider, MD  methocarbamol (ROBAXIN) 500 MG tablet Take 500 mg by mouth every 8 (eight) hours as needed for muscle spasms.     Historical Provider, MD  Multiple Vitamin (MULTIVITAMIN WITH MINERALS) TABS Take 1 tablet by mouth daily.    Historical Provider, MD  omeprazole (PRILOSEC) 40 MG capsule Take 1 capsule (40 mg total) by mouth 2 (two) times daily. 07/22/13   Theodis Blaze, MD  oxyCODONE-acetaminophen (PERCOCET)  10-325 MG per tablet Take 1-2 tablets by mouth every 6 (six) hours as needed for pain. 08/25/13   Katheren Shams, MD  sildenafil (VIAGRA) 100 MG tablet Take 100 mg by mouth daily as needed for erectile dysfunction.    Historical Provider, MD  tadalafil (CIALIS) 20 MG tablet Take 20 mg by mouth daily as needed for erectile dysfunction.    Historical Provider, MD   BP 162/101 mmHg  Pulse 82  Temp(Src) 97.7 F (36.5 C) (Oral)  Resp 16  Wt 194 lb (87.998 kg)  SpO2 96% Physical Exam  Constitutional: He is oriented to person, place, and time. He appears well-developed and well-nourished. No distress.  HENT:  Head: Normocephalic.  Mouth/Throat: No oropharyngeal exudate.  Eyes: Pupils are equal, round, and reactive to light.  Neck: Normal range of motion.  Cardiovascular: Normal rate, regular rhythm and normal heart sounds.   No murmur heard. Pulmonary/Chest: Effort normal. No stridor. No respiratory distress. He has no wheezes. He  exhibits no tenderness.  Abdominal: Soft. Bowel sounds are normal. He exhibits no distension. There is no tenderness. There is no rebound.  Musculoskeletal: He exhibits no tenderness.  Neurological: He is alert and oriented to person, place, and time. No cranial nerve deficit.  Skin: Skin is warm. He is not diaphoretic. No pallor.  Nursing note and vitals reviewed.   ED Course  Procedures (including critical care time) Labs Review Labs Reviewed  CBC - Abnormal; Notable for the following:    WBC 13.8 (*)    RDW 16.6 (*)    All other components within normal limits  BASIC METABOLIC PANEL - Abnormal; Notable for the following:    Potassium 3.4 (*)    All other components within normal limits  D-DIMER, QUANTITATIVE  CBC  CREATININE, SERUM  TSH  TROPONIN I  TROPONIN I  TROPONIN I  URINALYSIS, ROUTINE W REFLEX MICROSCOPIC  I-STAT TROPOININ, ED    Imaging Review Dg Chest 2 View  03/05/2015   CLINICAL DATA:  Chest pain and tightness starting this morning   EXAM: CHEST  2 VIEW  COMPARISON:  03/13/2013  FINDINGS: No cardiomegaly. Negative aortic and hilar contours. Stable pattern of linear opacity in the bilateral lower lungs, consistent with scarring. There is no edema, consolidation, effusion, or pneumothorax. No acute osseous findings. Two level cervical discectomy noted.  IMPRESSION: Mild lung scarring.  No change from 2014 to suggest acute disease.   Electronically Signed   By: Monte Fantasia M.D.   On: 03/05/2015 11:30     EKG Interpretation   Date/Time:  Monday Mar 05 2015 09:55:22 EDT Ventricular Rate:  79 PR Interval:  154 QRS Duration: 84 QT Interval:  384 QTC Calculation: 440 R Axis:   63 Text Interpretation:  Normal sinus rhythm Nonspecific ST abnormality  Abnormal ECG No significant change since last tracing Confirmed by Mingo Amber   MD, [place] ([id number]) on 03/05/2015 10:27:39 AM      MDM   Final diagnoses:  None    AWAB ABEBE is a 54 y.o. male with a past medical history significant for hypertension, GERD, headaches, and chronic neck pain who presents with chest pain and shortness of breath. Given the patient's description of his chest pain, there is concern for possible cardiac etiology of his symptoms. The patient's diagnostic laboratory testing results are seen above. The patient had initial troponin was negative at 0.00. The patient's CBC revealed a mild leukocytosis of 13.8 which is similar to prior readings. The patient's BMP showed a normal kidney function. The patient had a d-dimer checked which was negative making pulmonary embolism less likely. The patient was given sublingual nitroglycerin completely resolving his pain. The patient was given aspirin.  The patient's EKG showed a normal sinus rhythm with nonspecific ST abnormality. There appeared to be no significant change from prior.   Given the patient's improvement with nitroglycerin as well as his concerning history and risk factors, the patient will be admitted for  further workup and management of his chest pain. The patient's vital signs remained stable while remaining in the emergency department. The patient was admitted in stable condition.    This patient was seen with and care discussed with Dr. Mingo Amber, emergency medicine attending.      Antony Blackbird, MD 03/05/15 1553  Evelina Bucy, MD 03/06/15 [phone]

## 2015-03-05 NOTE — ED Notes (Signed)
Appt time: 15:07. Hope, RN notified

## 2015-03-05 NOTE — ED Notes (Signed)
Pt reports reports substernal chest pains radiating to left arm and SOb that began last night. Worsening this AM. Pt states feels as though he cant breathe. Lungs diminished.

## 2015-03-05 NOTE — ED Notes (Signed)
MD resident at bedside

## 2015-03-05 NOTE — H&P (Signed)
Patient ID: [name] [name] MRN: [id number], DOB/AGE: July 25, 1961   Admit date: 03/05/2015   Primary Physician: Lujean Amel, MD Primary Cardiologist: new - seen by M. Marlou Porch, MD   Pt. Profile:  54 year old male with no prior cardiac history presented to the ED this morning secondary to chest pressure.  Problem List  Past Medical History  Diagnosis Date  . Thyroid nodule   . Anxiety   . GERD (gastroesophageal reflux disease)   . Headache(784.0)     2 x/week  . Arthritis     neck, shoulder, left knee  . Essential hypertension     under control with med., has been on med. x 1 yr.  . 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 Medial meniscus tear 06/2013    left knee  . Limited joint range of motion     cervical fusion 02/2013  . Wears partial dentures     upper  . Leukocytosis, unspecified 11/03/2013  . Anemia   . Erythrocytosis 11/30/2014  . Tobacco abuse     Past Surgical History  Procedure Laterality Date  . Appendectomy    . Eye surgery Left     cataract  . Anterior cervical decomp/discectomy fusion N/A 03/18/2013    Procedure: ANTERIOR CERVICAL DECOMPRESSION/DISCECTOMY FUSION 3 LEVELS;  Surgeon: Erline Levine, MD;  Location: [facility];  Service: Neurosurgery;  Laterality: N/A;  Anterior Cervical Three-Six  Decompression/Diskectomy/Fusion  . Hemicolectomy Right 5/85/2778    with ileocolic anastomosis  . Small intestine surgery  04/24/2000    adhesiolysis, ileocolic anastomosis  . Circumcision  05/04/2006  . Shoulder arthroscopy w/ rotator cuff repair Left 07/16/2010  . Inguinal hernia repair    . Knee arthroscopy Left 08/01/2013    Procedure: LEFT ARTHROSCOPY KNEE;  Surgeon: Kerin Salen, MD;  Location: [facility];  Service: Orthopedics;  Laterality: Left;  . Upper gastrointestinal endoscopy    . Colonoscopy       Allergies  Allergies  Allergen Reactions  . Chantix [Varenicline] Palpitations    Heart race Increased panic attacks  . Morphine  Itching  . Penicillins Rash    HPI  54 year old male with the above list. He does not have a prior cardiac history but does have risk factors including hypertension and tobacco abuse. He has chronic neck pain with associated headaches related to degenerative disc disease requiring prior surgical interventions. He is on chronic pain medications at home. He reports that he was in his usual state of health until about 3-4 weeks ago when he began to experience some dyspnea on exertion while working out in his yard. Yesterday evening, while sitting at home, he developed sharp and fleeting substernal chest pain that radiated to both sides of his chest and resolved within 15 seconds. There were no associated symptoms. He slept well last night but after smoking a cigarette and using the bathroom this morning, he developed 7 at 10 substernal chest pressure associated with dyspnea and radiating down the left arm. Patient drove himself to the [facility] emergency department and was treated with 3 sublingual nitroglycerin tablets with eventual relief. He believes the total duration of symptoms was about 2 hours. Despite prolonged symptoms, ECG was nonacute and initial troponin was normal. Chest pain resolved but then just returned at approximately 1:30 PM after he pushed on his chest. He continues now to complain of 7/10 chest discomfort that is worse with palpation over the left lower sternal border. Chest pain is not  worse with deep breathing or position changes. He denies PND, orthopnea, dizziness, syncope, edema, or early satiety. Since receiving nitroglycerin earlier this morning, he has had a headache and is requesting pain medication. His RV given 1000 g of Tylenol earlier today.  Home Medications  Prior to Admission medications   Medication Sig Start Date End Date Taking? Authorizing Provider  atenolol-chlorthalidone (TENORETIC) 50-25 MG per tablet Take 1 tablet by mouth daily.   Yes Historical Provider, MD    carisoprodol (SOMA) 350 MG tablet Take 350 mg by mouth every 8 (eight) hours as needed. pain 02/06/15  Yes Historical Provider, MD  EQ NICOTINE 21 MG/24HR patch Place 1 patch onto the skin daily. 02/19/15  Yes Historical Provider, MD  gabapentin (NEURONTIN) 100 MG capsule Take 100 mg by mouth 2 (two) times daily as needed (nerve pain).  11/14/14  Yes Historical Provider, MD  LORazepam (ATIVAN) 1 MG tablet Take 1 mg by mouth 2 (two) times daily between meals as needed for anxiety.   Yes Historical Provider, MD  methocarbamol (ROBAXIN) 500 MG tablet Take 500 mg by mouth every 8 (eight) hours as needed for muscle spasms.    Yes Historical Provider, MD  Multiple Vitamin (MULTIVITAMIN WITH MINERALS) TABS Take 1 tablet by mouth daily.   Yes Historical Provider, MD  omeprazole (PRILOSEC) 40 MG capsule Take 1 capsule (40 mg total) by mouth 2 (two) times daily. 07/22/13  Yes Theodis Blaze, MD  oxyCODONE-acetaminophen (PERCOCET) 10-325 MG per tablet Take 1-2 tablets by mouth every 6 (six) hours as needed for pain. 08/25/13  Yes Katheren Shams, MD  tadalafil (CIALIS) 20 MG tablet Take 20 mg by mouth daily as needed for erectile dysfunction.   Yes Historical Provider, MD  atenolol-chlorthalidone (TENORETIC) 100-25 MG per tablet Take 1 tablet by mouth daily. Patient not taking: Reported on 03/05/2015 07/22/13   Theodis Blaze, MD    Family History  Family History  Problem Relation Age of Onset  . Cancer Father 69    colon ca - deceased.  . Colon cancer Father   . Cancer Sister 50    breast ca    Social History  History   Social History  . Marital Status: Married    Spouse Name: N/A  . Number of Children: N/A  . Years of Education: N/A   Occupational History  . Not on file.   Social History Main Topics  . Smoking status: Current Every Day Smoker -- 1.00 packs/day for 25 years    Types: Cigarettes  . Smokeless tobacco: Never Used  . Alcohol Use: Yes     Comment: occasionally  . Drug Use: No  .  Sexual Activity: Not on file   Other Topics Concern  . Not on file   Social History Narrative   Lives in [place] with his wife.     Review of Systems General:  No chills, fever, night sweats or weight changes.  Cardiovascular:  Positive chest pain, positive dyspnea on exertion, no edema, orthopnea, palpitations, paroxysmal nocturnal dyspnea. Dermatological: No rash, lesions/masses Respiratory: No cough, positive dyspnea Urologic: No hematuria, dysuria Abdominal:   No nausea, vomiting, diarrhea, bright red blood per rectum, melena, or hematemesis Neurologic:  Currently complains of headache. No visual changes, wkns, changes in mental status. All other systems reviewed and are otherwise negative except as noted above.  Physical Exam  Blood pressure 127/91, pulse 58, temperature 97.7 F (36.5 C), temperature source Oral, resp. rate 14, weight 194 lb (87.998 kg),  SpO2 97 %.  General: Pleasant, NAD Psych: Normal affect. Neuro: Alert and oriented X 3. Moves all extremities spontaneously. HEENT: Normal  Neck: Supple without bruits or JVD. Lungs:  Resp regular and unlabored, CTA. Heart: RRR no s3, s4, or murmurs. Abdomen: Soft, non-tender, non-distended, BS + x 4.  Extremities: No clubbing, cyanosis or edema. DP/PT/Radials 2+ and equal bilaterally.  Labs  Troponin [facility] Test)  Recent Labs  03/05/15 1012  TROPIPOC 0.00   Lab Results  Component Value Date   WBC 13.8* 03/05/2015   HGB 14.3 03/05/2015   HCT 42.2 03/05/2015   MCV 80.7 03/05/2015   PLT 338 03/05/2015    Recent Labs Lab 03/05/15 1000  NA 138  K 3.4*  CL 104  CO2 24  BUN 12  CREATININE 0.88  CALCIUM 9.5  GLUCOSE 98   Lab Results  Component Value Date   CHOL 239* 07/22/2013   HDL 48 07/22/2013   LDLCALC 136* 07/22/2013   TRIG 276* 07/22/2013   Lab Results  Component Value Date   DDIMER <0.27 03/05/2015     Radiology/Studies  Dg Chest 2 View  03/05/2015   CLINICAL DATA:  Chest pain and  tightness starting this morning  EXAM: CHEST  2 VIEW  COMPARISON:  03/13/2013  FINDINGS: No cardiomegaly. Negative aortic and hilar contours. Stable pattern of linear opacity in the bilateral lower lungs, consistent with scarring. There is no edema, consolidation, effusion, or pneumothorax. No acute osseous findings. Two level cervical discectomy noted.  IMPRESSION: Mild lung scarring.  No change from 2014 to suggest acute disease.   Electronically Signed   By: Monte Fantasia M.D.   On: 03/05/2015 11:30   ECG  Rsr, 79, no acute st/t changes.  ASSESSMENT AND PLAN  1. Midsternal chest pain: Patient presented to the ED this morning with a 2 hour history of substernal chest pressure and dyspnea that resolved with nitroglycerin. He began to have intermittent, fleeting and sharp chest pain beginning last night. Chest pain has returned while here in the ED and is reproducible with palpation. Despite prolonged symptoms, ECG is without acute changes and initial troponin is normal. D-dimer is also normal. We will plan to observe and cycle cardiac markers. Add aspirin and statin and repeat lipid profile. Total cholesterol appears to be 239 in September 2014. Provided that patient rules out and chest pain is controlled, we'll likely plan for discharge in the a.m. and outpatient stress testing given risk factors including hypertension and tobacco abuse. If he rules in, we will plan on diagnostic catheterization.  2. Essential hypertension: Stable on beta blocker and diuretic therapy at home. Continue to follow here.  3. Hyperlipidemia: Total cholesterol 239 with an LDL of 136 in September 2014. Repeat lipids and LFTs. Add statin the setting of chest pain as above.  4. Tobacco abuse: Complete cessation advised. Next  5. Hypokalemia: Supplement.  6. Leukocytosis: White count is elevated at 13.8. Does have prior history of leukocytosis documented. He does not appear to be infected though he does complain of  frequency of urination I will check a urinalysis.  7. Chronic neck pain with headache: Resume home pain medicine. We will give him 1 dose of Toradol here in the ER.  Signed, Murray Hodgkins, NP 03/05/2015, 1:51 PM\  Personally seen and examined. Agree with above. ?MSK component to CP. Chronic neck pain noted. Asking for pain meds. Nonetheless with smoking history could have CAD (pressure noted). Trop and ECG unremarkable. If normal  troponin serial, hopeful DC in AM. Outpatient stress.   Candee Furbish, MD

## 2015-03-05 NOTE — ED Notes (Signed)
Chest pain has subsided some since his 2nd nitroglycerin

## 2015-03-06 ENCOUNTER — Other Ambulatory Visit: Payer: Self-pay | Admitting: Physician Assistant

## 2015-03-06 DIAGNOSIS — M545 Low back pain: Secondary | ICD-10-CM | POA: Diagnosis not present

## 2015-03-06 DIAGNOSIS — R072 Precordial pain: Secondary | ICD-10-CM

## 2015-03-06 DIAGNOSIS — M5412 Radiculopathy, cervical region: Secondary | ICD-10-CM | POA: Diagnosis not present

## 2015-03-06 DIAGNOSIS — M791 Myalgia: Secondary | ICD-10-CM | POA: Diagnosis not present

## 2015-03-06 DIAGNOSIS — M5440 Lumbago with sciatica, unspecified side: Secondary | ICD-10-CM | POA: Diagnosis not present

## 2015-03-06 DIAGNOSIS — M5030 Other cervical disc degeneration, unspecified cervical region: Secondary | ICD-10-CM | POA: Diagnosis not present

## 2015-03-06 DIAGNOSIS — M542 Cervicalgia: Secondary | ICD-10-CM | POA: Diagnosis not present

## 2015-03-06 DIAGNOSIS — E785 Hyperlipidemia, unspecified: Secondary | ICD-10-CM | POA: Diagnosis not present

## 2015-03-06 DIAGNOSIS — M4722 Other spondylosis with radiculopathy, cervical region: Secondary | ICD-10-CM | POA: Diagnosis not present

## 2015-03-06 DIAGNOSIS — Z6828 Body mass index (BMI) 28.0-28.9, adult: Secondary | ICD-10-CM | POA: Diagnosis not present

## 2015-03-06 DIAGNOSIS — G4489 Other headache syndrome: Secondary | ICD-10-CM | POA: Diagnosis not present

## 2015-03-06 DIAGNOSIS — R079 Chest pain, unspecified: Secondary | ICD-10-CM

## 2015-03-06 DIAGNOSIS — M5416 Radiculopathy, lumbar region: Secondary | ICD-10-CM | POA: Diagnosis not present

## 2015-03-06 LAB — BASIC METABOLIC PANEL
Anion gap: 9 (ref 5–15)
BUN: 16 mg/dL (ref 6–20)
CO2: 25 mmol/L (ref 22–32)
CREATININE: 1.1 mg/dL (ref 0.61–1.24)
Calcium: 8.9 mg/dL (ref 8.9–10.3)
Chloride: 102 mmol/L (ref 101–111)
GLUCOSE: 110 mg/dL — AB (ref 70–99)
POTASSIUM: 3.2 mmol/L — AB (ref 3.5–5.1)
Sodium: 136 mmol/L (ref 135–145)

## 2015-03-06 LAB — LIPID PANEL
CHOL/HDL RATIO: 5.4 ratio
CHOLESTEROL: 183 mg/dL (ref 0–200)
HDL: 34 mg/dL — ABNORMAL LOW (ref >40)
LDL CALC: 73 mg/dL (ref 0–99)
TRIGLYCERIDES: 379 mg/dL — AB (ref <150)
VLDL: 76 mg/dL — AB (ref 0–40)

## 2015-03-06 LAB — TROPONIN I: Troponin I: <0.03 ng/mL (ref <0.031)

## 2015-03-06 MED ORDER — ASPIRIN 81 MG PO TBEC: 81.0000 mg | DELAYED_RELEASE_TABLET | Freq: Every day | ORAL | Status: DC

## 2015-03-06 NOTE — Discharge Summary (Signed)
Discharge Summary   Patient ID: GRIFFEY NICASIO,  MRN: [id number], DOB/AGE: July 20, 1961 54 y.o.  Admit date: 03/05/2015 Discharge date: 03/06/2015  Primary Care Provider: Lujean Amel Primary Cardiologist: Dr. Marlou Porch  Discharge Diagnoses Principal Problem:   Midsternal chest pain Active Problems:   HTN (hypertension)   Smoker   COPD (chronic obstructive pulmonary disease)   Essential hypertension   Allergies Allergies  Allergen Reactions  . Chantix [Varenicline] Palpitations    Heart race Increased panic attacks  . Morphine Itching  . Penicillins Rash   Hospital Course  The patient is a 54 year old male with past medical history of hypertension, COPD, however no prior cardiac history. He has chronic back pain associated with headache related to degenerative disc disease requiring prior surgical intervention. He is on chronic pain medicine at home. He reports he was in his usual state of health until 2-4 weeks ago when he began to experience some dyspnea on exertion while working out in his yard. He developed sharp fleeting substernal chest pain radiating to both side of his chest on the night of 5/80/2016. He drove himself to [facility] ED to seek medical attention and was treated with 3 sublingual nitroglycerin with eventual relief. The total duration of the symptom was roughly 2 hours. Despite prolonged symptoms, EKG was nonacute, initial troponin was normal. D-dimer was also normal. He was admitted to cardiology service. Overnight, serial troponin was negative.  He was seen in the morning of 03/06/2015, lipid panel shows triglycerides 379, HDL 34, LDL 73. His chest pain has resolved. Overnight, he did have some atypical chest pain that is accentuated with twisting motion, patient thinks he pulled a muscle. It was also worse with deep palpation. He is deemed stable for discharge from cardiac perspective. His pain was felt to be likely musculoskeletal. I have arranged a one-week  Lexiscan Myoview as outpatient before follow-up with Dr. Marlou Porch in the clinic. We will not add statin for now, as he will be reassessed by his PCP in 3 month after diet changes.    Discharge Vitals Blood pressure 123/77, pulse 66, temperature 97.8 F (36.6 C), temperature source Oral, resp. rate 18, height 5\' 10"  (1.778 m), weight 197 lb 6.4 oz (89.54 kg), SpO2 99 %.  Filed Weights   03/05/15 1528 03/05/15 2045 03/06/15 0625  Weight: 195 lb (88.451 kg) 196 lb 6.4 oz (89.086 kg) 197 lb 6.4 oz (89.54 kg)    Labs  CBC  Recent Labs  03/05/15 1000 03/05/15 1647  WBC 13.8* 10.2  HGB 14.3 13.6  HCT 42.2 40.9  MCV 80.7 80.2  PLT 338 448   Basic Metabolic Panel  Recent Labs  03/05/15 1000 03/05/15 1647 03/06/15 0315  NA 138  --  136  K 3.4*  --  3.2*  CL 104  --  102  CO2 24  --  25  GLUCOSE 98  --  110*  BUN 12  --  16  CREATININE 0.88 1.01 1.10  CALCIUM 9.5  --  8.9   Cardiac Enzymes  Recent Labs  03/05/15 1647 03/05/15 2245 03/06/15 0315  TROPONINI <0.03 <0.03 <0.03   BNP Invalid input(s): POCBNP D-Dimer  Recent Labs  03/05/15 1100  DDIMER <0.27   Fasting Lipid Panel  Recent Labs  03/06/15 0315  CHOL 183  HDL 34*  LDLCALC 73  TRIG 379*  CHOLHDL 5.4   Thyroid Function Tests  Recent Labs  03/05/15 1647  TSH 0.327*    Disposition  Pt is being discharged  home today in good condition.  Follow-up Plans & Appointments      Follow-up Information    Follow up with [facility] On 03/08/2015.   Specialty:  Cardiology   Why:  7:00am. Outpatient stress test. Please no food or drink after midnight on the day of procedure, sips of water with medication ok    Contact information:   [address] [phone]      Follow up with Richardson Dopp, PA-C On 04/04/2015.   Specialties:  Physician Assistant, Radiology, Interventional Cardiology   Why:  8:50am   Contact information:    [address] [address] [phone]       Discharge Medications    Medication List    TAKE these medications        aspirin 81 MG EC tablet  Take 1 tablet (81 mg total) by mouth daily.     atenolol-chlorthalidone 50-25 MG per tablet  Commonly known as:  TENORETIC  Take 1 tablet by mouth daily.     carisoprodol 350 MG tablet  Commonly known as:  SOMA  Take 350 mg by mouth every 8 (eight) hours as needed. pain     EQ NICOTINE 21 mg/24hr patch  Generic drug:  nicotine  Place 1 patch onto the skin daily.     gabapentin 100 MG capsule  Commonly known as:  NEURONTIN  Take 100 mg by mouth 2 (two) times daily as needed (nerve pain).     LORazepam 1 MG tablet  Commonly known as:  ATIVAN  Take 1 mg by mouth 2 (two) times daily between meals as needed for anxiety.     methocarbamol 500 MG tablet  Commonly known as:  ROBAXIN  Take 500 mg by mouth every 8 (eight) hours as needed for muscle spasms.     multivitamin with minerals Tabs tablet  Take 1 tablet by mouth daily.     omeprazole 40 MG capsule  Commonly known as:  PRILOSEC  Take 1 capsule (40 mg total) by mouth 2 (two) times daily.     oxyCODONE-acetaminophen 10-325 MG per tablet  Commonly known as:  PERCOCET  Take 1-2 tablets by mouth every 6 (six) hours as needed for pain.     tadalafil 20 MG tablet  Commonly known as:  CIALIS  Take 20 mg by mouth daily as needed for erectile dysfunction.        Outstanding Labs/Studies  Outpatient lexiscan myoview  Duration of Discharge Encounter   Greater than 30 minutes including physician time.  Hilbert Corrigan PA-C Pager: [phone] 03/06/2015, 9:40 AM

## 2015-03-06 NOTE — Discharge Instructions (Signed)

## 2015-03-06 NOTE — Progress Notes (Signed)
UR Completed Ophie Burrowes Graves-Bigelow, RN,BSN [phone]  

## 2015-03-06 NOTE — Progress Notes (Signed)
    Subjective:  No CP this AM. Earlier pain across chest wall accentuated with twisting motion. He thinks pulled muscle. Also worse when he pushed on chest wall.  Objective:  Vital Signs in the last 24 hours: Temp:  [97.7 F (36.5 C)-98.1 F (36.7 C)] 97.8 F (36.6 C) (05/10 0625) Pulse Rate:  [58-82] 66 (05/10 0625) Resp:  [12-20] 18 (05/09 2000) BP: (102-162)/(63-101) 123/77 mmHg (05/10 0625) SpO2:  [95 %-99 %] 99 % (05/10 0625) Weight:  [194 lb (87.998 kg)-197 lb 6.4 oz (89.54 kg)] 197 lb 6.4 oz (89.54 kg) (05/10 0625)  Intake/Output from previous day: 05/09 0701 - 05/10 0700 In: 240 [P.O.:240] Out: -    Physical Exam: General: Well developed, well nourished, in no acute distress. Head:  Normocephalic and atraumatic. Lungs: Clear to auscultation and percussion. Heart: Normal S1 and S2.  No murmur, rubs or gallops.  Abdomen: soft, non-tender, positive bowel sounds. Extremities: No clubbing or cyanosis. No edema. Neurologic: Alert and oriented x 3.    Lab Results:  Recent Labs  03/05/15 1000 03/05/15 1647  WBC 13.8* 10.2  HGB 14.3 13.6  PLT 338 326    Recent Labs  03/05/15 1000 03/05/15 1647 03/06/15 0315  NA 138  --  136  K 3.4*  --  3.2*  CL 104  --  102  CO2 24  --  25  GLUCOSE 98  --  110*  BUN 12  --  16  CREATININE 0.88 1.01 1.10    Recent Labs  03/05/15 2245 03/06/15 0315  TROPONINI <0.03 <0.03   Hepatic Function Panel No results for input(s): PROT, ALBUMIN, AST, ALT, ALKPHOS, BILITOT, BILIDIR, IBILI in the last 72 hours.  Recent Labs  03/06/15 0315  CHOL 183   No results for input(s): PROTIME in the last 72 hours.  Imaging: Dg Chest 2 View  03/05/2015   CLINICAL DATA:  Chest pain and tightness starting this morning  EXAM: CHEST  2 VIEW  COMPARISON:  03/13/2013  FINDINGS: No cardiomegaly. Negative aortic and hilar contours. Stable pattern of linear opacity in the bilateral lower lungs, consistent with scarring. There is no edema,  consolidation, effusion, or pneumothorax. No acute osseous findings. Two level cervical discectomy noted.  IMPRESSION: Mild lung scarring.  No change from 2014 to suggest acute disease.   Electronically Signed   By: Monte Fantasia M.D.   On: 03/05/2015 11:30   Personally viewed.   Telemetry: No adverse rhythm Personally viewed.   EKG:  No ST changes  Scheduled Meds: . aspirin EC  81 mg Oral Daily  . atenolol  50 mg Oral Daily   And  . chlorthalidone  25 mg Oral Daily  . atorvastatin  80 mg Oral q1800  . heparin  5,000 Units Subcutaneous 3 times per day  . multivitamin with minerals  1 tablet Oral Daily  . nicotine  21 mg Transdermal Daily  . pantoprazole  40 mg Oral Daily  . sodium chloride  3 mL Intravenous Q12H   Continuous Infusions:  PRN Meds:.sodium chloride, acetaminophen, carisoprodol, gabapentin, ketorolac, LORazepam, methocarbamol, nitroGLYCERIN, ondansetron (ZOFRAN) IV, oxyCODONE-acetaminophen **AND** oxyCODONE, sodium chloride   Assessment/Plan:  Active Problems:   Midsternal chest pain  -OK for DC -Outpatient NUC stress -Pain likely MSK -Will not send out on statin. He is seeing PCP in 3 months after diet changes to reassess.  -continue PPI -has chronic neck pain (multiple meds)   SKAINS, MARK 03/06/2015, 8:43 AM

## 2015-03-07 ENCOUNTER — Telehealth (HOSPITAL_COMMUNITY): Payer: Self-pay | Admitting: *Deleted

## 2015-03-07 NOTE — Telephone Encounter (Signed)
Patient given detailed instructions per Myocardial Perfusion Study Information Sheet for test on 03/08/15 at 7 am Patient verbalized understanding. Crissie Figures RN

## 2015-03-08 ENCOUNTER — Ambulatory Visit (HOSPITAL_COMMUNITY): Payer: Medicare Other | Attending: Cardiology

## 2015-03-08 DIAGNOSIS — R079 Chest pain, unspecified: Secondary | ICD-10-CM | POA: Diagnosis not present

## 2015-03-08 DIAGNOSIS — I10 Essential (primary) hypertension: Secondary | ICD-10-CM | POA: Diagnosis not present

## 2015-03-08 DIAGNOSIS — R0609 Other forms of dyspnea: Secondary | ICD-10-CM | POA: Diagnosis not present

## 2015-03-08 LAB — MYOCARDIAL PERFUSION IMAGING
CHL CUP NUCLEAR SRS: 1
CHL CUP NUCLEAR SSS: 2
CHL CUP RESTING HR STRESS: 67 {beats}/min
CHL CUP STRESS STAGE 1 HR: 69 {beats}/min
CHL CUP STRESS STAGE 2 GRADE: 0.1 %
CHL CUP STRESS STAGE 2 SPEED: 0 mph
CHL CUP STRESS STAGE 3 GRADE: 10 %
CHL CUP STRESS STAGE 3 HR: 113 {beats}/min
CHL CUP STRESS STAGE 3 SPEED: 0 mph
CHL CUP STRESS STAGE 4 HR: 102 {beats}/min
CHL CUP STRESS STAGE 5 HR: 85 {beats}/min
CHL CUP STRESS STAGE 5 SPEED: 0 mph
CSEPEW: 1 METS
LHR: 0.41
LV dias vol: 135 mL
LV sys vol: 63 mL
Nuc Stress EF: 54 %
Peak HR: 113 {beats}/min
Percent of predicted max HR: 68 %
SDS: 1
Stage 1 DBP: 85 mmHg
Stage 1 Grade: 0 %
Stage 1 SBP: 127 mmHg
Stage 1 Speed: 0 mph
Stage 2 HR: 70 {beats}/min
Stage 4 DBP: 77 mmHg
Stage 4 Grade: 0 %
Stage 4 SBP: 146 mmHg
Stage 4 Speed: 0 mph
Stage 5 DBP: 74 mmHg
Stage 5 Grade: 0 %
Stage 5 SBP: 122 mmHg
TID: 1.05

## 2015-03-08 MED ORDER — REGADENOSON 0.4 MG/5ML IV SOLN
0.4000 mg | Freq: Once | INTRAVENOUS | Status: AC
  Administered 2015-03-08: 0.4 mg via INTRAVENOUS

## 2015-03-08 MED ORDER — TECHNETIUM TC 99M SESTAMIBI GENERIC - CARDIOLITE
11.0000 | Freq: Once | INTRAVENOUS | Status: AC | PRN
  Administered 2015-03-08: 11 via INTRAVENOUS

## 2015-03-08 MED ORDER — TECHNETIUM TC 99M SESTAMIBI GENERIC - CARDIOLITE
33.0000 | Freq: Once | INTRAVENOUS | Status: AC | PRN
  Administered 2015-03-08: 33 via INTRAVENOUS

## 2015-03-20 DIAGNOSIS — M542 Cervicalgia: Secondary | ICD-10-CM | POA: Diagnosis not present

## 2015-03-27 DIAGNOSIS — M542 Cervicalgia: Secondary | ICD-10-CM | POA: Diagnosis not present

## 2015-03-29 DIAGNOSIS — M542 Cervicalgia: Secondary | ICD-10-CM | POA: Diagnosis not present

## 2015-04-03 DIAGNOSIS — M25562 Pain in left knee: Secondary | ICD-10-CM | POA: Diagnosis not present

## 2015-04-03 DIAGNOSIS — M1712 Unilateral primary osteoarthritis, left knee: Secondary | ICD-10-CM | POA: Diagnosis not present

## 2015-04-03 NOTE — Progress Notes (Signed)
Cardiology Office Note   Date:  04/03/2015   ID:  [name] [name], DOB November 02, 1960, MRN [id number]  PCP:  Lujean Amel, MD  Cardiologist:  Dr. Candee Furbish     Chief Complaint  Patient presents with  . Hospitalization Follow-up    admitted with chest pain     History of Present Illness: [name] [name] is a 54 y.o. male with a hx of HTN, COPD, tobacco abuse.  He was admitted last month with CP. CEs and DDimer were neg.  He was DC and underwent an OP nuclear stress test.  This demonstrated no ischemia and normal LVF.  He returns for FU.     Studies/Reports Reviewed Today:  Myoview 03/08/15 Study Impression Myocardial perfusion is normal. The study is normal. This is a low risk study. Overall left ventricular systolic function was normal. LV cavity size is normal. The left ventricular ejection fraction is mildly decreased (45-54%). There is no prior study for comparison.Normal lexiscan myovue with no ischemia or infarct EF 54%    Past Medical History  Diagnosis Date  . Thyroid nodule   . Anxiety   . GERD (gastroesophageal reflux disease)   . Headache(784.0)     2 x/week  . Arthritis     neck, shoulder, left knee  . Essential hypertension     under control with med., has been on med. x 1 yr.  . 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 Medial meniscus tear 06/2013    left knee  . Limited joint range of motion     cervical fusion 02/2013  . Wears partial dentures     upper  . Leukocytosis, unspecified 11/03/2013  . Anemia   . Erythrocytosis 11/30/2014  . Tobacco abuse     Past Surgical History  Procedure Laterality Date  . Appendectomy    . Eye surgery Left     cataract  . Anterior cervical decomp/discectomy fusion N/A 03/18/2013    Procedure: ANTERIOR CERVICAL DECOMPRESSION/DISCECTOMY FUSION 3 LEVELS;  Surgeon: Erline Levine, MD;  Location: [facility];  Service: Neurosurgery;  Laterality: N/A;  Anterior Cervical Three-Six  Decompression/Diskectomy/Fusion  . Hemicolectomy  Right 2/44/0102    with ileocolic anastomosis  . Small intestine surgery  04/24/2000    adhesiolysis, ileocolic anastomosis  . Circumcision  05/04/2006  . Shoulder arthroscopy w/ rotator cuff repair Left 07/16/2010  . Inguinal hernia repair    . Knee arthroscopy Left 08/01/2013    Procedure: LEFT ARTHROSCOPY KNEE;  Surgeon: Kerin Salen, MD;  Location: [facility];  Service: Orthopedics;  Laterality: Left;  . Upper gastrointestinal endoscopy    . Colonoscopy       Current Outpatient Prescriptions  Medication Sig Dispense Refill  . aspirin EC 81 MG EC tablet Take 1 tablet (81 mg total) by mouth daily.    Marland Kitchen atenolol-chlorthalidone (TENORETIC) 50-25 MG per tablet Take 1 tablet by mouth daily.    . carisoprodol (SOMA) 350 MG tablet Take 350 mg by mouth every 8 (eight) hours as needed. pain    . EQ NICOTINE 21 MG/24HR patch Place 1 patch onto the skin daily.    Marland Kitchen gabapentin (NEURONTIN) 100 MG capsule Take 100 mg by mouth 2 (two) times daily as needed (nerve pain).     . LORazepam (ATIVAN) 1 MG tablet Take 1 mg by mouth 2 (two) times daily between meals as needed for anxiety.    . methocarbamol (ROBAXIN) 500 MG tablet Take 500 mg by  mouth every 8 (eight) hours as needed for muscle spasms.     . Multiple Vitamin (MULTIVITAMIN WITH MINERALS) TABS Take 1 tablet by mouth daily.    Marland Kitchen omeprazole (PRILOSEC) 40 MG capsule Take 1 capsule (40 mg total) by mouth 2 (two) times daily. 60 capsule 5  . oxyCODONE-acetaminophen (PERCOCET) 10-325 MG per tablet Take 1-2 tablets by mouth every 6 (six) hours as needed for pain. 30 tablet 0  . tadalafil (CIALIS) 20 MG tablet Take 20 mg by mouth daily as needed for erectile dysfunction.    . [DISCONTINUED] simethicone (MYLICON) 80 MG chewable tablet Chew 80 mg by mouth every 6 (six) hours as needed for flatulence.     No current facility-administered medications for this visit.    Allergies:   Chantix; Morphine; and Penicillins    Social History:   The patient  reports that he has been smoking Cigarettes.  He has a 25 pack-year smoking history. He has never used smokeless tobacco. He reports that he drinks alcohol. He reports that he does not use illicit drugs.   Family History:  The patient's family history includes Cancer (age of onset: 61) in his sister; Cancer (age of onset: 92) in his father; Colon cancer in his father.    ROS:   Please see the history of present illness.   ROS    PHYSICAL EXAM: VS:  There were no vitals taken for this visit.    Wt Readings from Last 3 Encounters:  03/08/15 193 lb (87.544 kg)  03/06/15 197 lb 6.4 oz (89.54 kg)  12/14/14 192 lb (87.091 kg)     GEN: Well nourished, well developed, in no acute distress HEENT: normal Neck: no JVD, no carotid bruits, no masses Cardiac:  Normal S1/S2, RRR; no murmur ,  no rubs or gallops, no edema  Respiratory:  clear to auscultation bilaterally, no wheezing, rhonchi or rales. GI: soft, nontender, nondistended, + BS MS: no deformity or atrophy Skin: warm and dry  Neuro:  CNs II-XII intact, Strength and sensation are intact Psych: Normal affect   EKG:  EKG is ordered today.  It demonstrates:      Recent Labs: 03/05/2015: Hemoglobin 13.6; Platelets 326; TSH 0.327* 03/06/2015: BUN 16; Creatinine 1.10; Potassium 3.2*; Sodium 136    Lipid Panel    Component Value Date/Time   CHOL 183 03/06/2015 0315   TRIG 379* 03/06/2015 0315   HDL 34* 03/06/2015 0315   CHOLHDL 5.4 03/06/2015 0315   VLDL 76* 03/06/2015 0315   LDLCALC 73 03/06/2015 0315      ASSESSMENT AND PLAN:  Midsternal chest pain  Essential hypertension  Smoker  Chronic obstructive pulmonary disease, unspecified COPD, unspecified chronic bronchitis type    Current medicines are reviewed at length with the patient today.  Concerns regarding medicines are as outlined above.  The following changes have been made:       Labs/ tests ordered today include:  No orders of the defined  types were placed in this encounter.    Disposition:   FU with    Signed, Richardson Dopp, PA-C, MHS 04/03/2015 10:31 PM    [facility] [address], [place], [place]  [address] Phone: [phone]; Fax: [phone]    This encounter was created in error - please disregard.

## 2015-04-04 ENCOUNTER — Encounter: Payer: Medicare Other | Admitting: Physician Assistant

## 2015-04-05 DIAGNOSIS — M542 Cervicalgia: Secondary | ICD-10-CM | POA: Diagnosis not present

## 2015-04-10 DIAGNOSIS — M542 Cervicalgia: Secondary | ICD-10-CM | POA: Diagnosis not present

## 2015-04-19 DIAGNOSIS — M542 Cervicalgia: Secondary | ICD-10-CM | POA: Diagnosis not present

## 2015-04-24 DIAGNOSIS — M542 Cervicalgia: Secondary | ICD-10-CM | POA: Diagnosis not present

## 2015-04-25 DIAGNOSIS — M542 Cervicalgia: Secondary | ICD-10-CM | POA: Diagnosis not present

## 2015-05-02 DIAGNOSIS — M549 Dorsalgia, unspecified: Secondary | ICD-10-CM | POA: Diagnosis not present

## 2015-05-02 DIAGNOSIS — M25562 Pain in left knee: Secondary | ICD-10-CM | POA: Diagnosis not present

## 2015-05-02 DIAGNOSIS — D509 Iron deficiency anemia, unspecified: Secondary | ICD-10-CM | POA: Diagnosis not present

## 2015-05-02 DIAGNOSIS — N5201 Erectile dysfunction due to arterial insufficiency: Secondary | ICD-10-CM | POA: Diagnosis not present

## 2015-05-02 DIAGNOSIS — F410 Panic disorder [episodic paroxysmal anxiety] without agoraphobia: Secondary | ICD-10-CM | POA: Diagnosis not present

## 2015-05-02 DIAGNOSIS — I10 Essential (primary) hypertension: Secondary | ICD-10-CM | POA: Diagnosis not present

## 2015-05-02 DIAGNOSIS — E041 Nontoxic single thyroid nodule: Secondary | ICD-10-CM | POA: Diagnosis not present

## 2015-05-30 DIAGNOSIS — M5440 Lumbago with sciatica, unspecified side: Secondary | ICD-10-CM | POA: Diagnosis not present

## 2015-05-30 DIAGNOSIS — M5030 Other cervical disc degeneration, unspecified cervical region: Secondary | ICD-10-CM | POA: Diagnosis not present

## 2015-05-30 DIAGNOSIS — M4722 Other spondylosis with radiculopathy, cervical region: Secondary | ICD-10-CM | POA: Diagnosis not present

## 2015-05-30 DIAGNOSIS — M5416 Radiculopathy, lumbar region: Secondary | ICD-10-CM | POA: Diagnosis not present

## 2015-05-30 DIAGNOSIS — M791 Myalgia: Secondary | ICD-10-CM | POA: Diagnosis not present

## 2015-05-30 DIAGNOSIS — G4489 Other headache syndrome: Secondary | ICD-10-CM | POA: Diagnosis not present

## 2015-05-30 DIAGNOSIS — M545 Low back pain: Secondary | ICD-10-CM | POA: Diagnosis not present

## 2015-05-30 DIAGNOSIS — M5412 Radiculopathy, cervical region: Secondary | ICD-10-CM | POA: Diagnosis not present

## 2015-07-31 DIAGNOSIS — J209 Acute bronchitis, unspecified: Secondary | ICD-10-CM | POA: Diagnosis not present

## 2015-07-31 DIAGNOSIS — R062 Wheezing: Secondary | ICD-10-CM | POA: Diagnosis not present

## 2015-07-31 DIAGNOSIS — R05 Cough: Secondary | ICD-10-CM | POA: Diagnosis not present

## 2015-08-17 DIAGNOSIS — F411 Generalized anxiety disorder: Secondary | ICD-10-CM | POA: Diagnosis not present

## 2015-08-17 DIAGNOSIS — N5201 Erectile dysfunction due to arterial insufficiency: Secondary | ICD-10-CM | POA: Diagnosis not present

## 2015-08-17 DIAGNOSIS — I10 Essential (primary) hypertension: Secondary | ICD-10-CM | POA: Diagnosis not present

## 2015-08-17 DIAGNOSIS — D649 Anemia, unspecified: Secondary | ICD-10-CM | POA: Diagnosis not present

## 2015-08-17 DIAGNOSIS — Z23 Encounter for immunization: Secondary | ICD-10-CM | POA: Diagnosis not present

## 2015-08-17 DIAGNOSIS — E785 Hyperlipidemia, unspecified: Secondary | ICD-10-CM | POA: Diagnosis not present

## 2015-09-10 DIAGNOSIS — M791 Myalgia: Secondary | ICD-10-CM | POA: Diagnosis not present

## 2015-09-10 DIAGNOSIS — M5030 Other cervical disc degeneration, unspecified cervical region: Secondary | ICD-10-CM | POA: Diagnosis not present

## 2015-09-10 DIAGNOSIS — M5440 Lumbago with sciatica, unspecified side: Secondary | ICD-10-CM | POA: Diagnosis not present

## 2015-09-10 DIAGNOSIS — M5412 Radiculopathy, cervical region: Secondary | ICD-10-CM | POA: Diagnosis not present

## 2015-09-10 DIAGNOSIS — M5416 Radiculopathy, lumbar region: Secondary | ICD-10-CM | POA: Diagnosis not present

## 2015-09-10 DIAGNOSIS — M545 Low back pain: Secondary | ICD-10-CM | POA: Diagnosis not present

## 2015-09-10 DIAGNOSIS — M4722 Other spondylosis with radiculopathy, cervical region: Secondary | ICD-10-CM | POA: Diagnosis not present

## 2015-09-10 DIAGNOSIS — G4489 Other headache syndrome: Secondary | ICD-10-CM | POA: Diagnosis not present

## 2015-09-10 DIAGNOSIS — Z6828 Body mass index (BMI) 28.0-28.9, adult: Secondary | ICD-10-CM | POA: Diagnosis not present

## 2015-09-14 ENCOUNTER — Other Ambulatory Visit: Payer: Self-pay | Admitting: Hematology and Oncology

## 2015-09-15 ENCOUNTER — Encounter (HOSPITAL_COMMUNITY): Payer: Self-pay

## 2015-09-15 ENCOUNTER — Emergency Department (HOSPITAL_COMMUNITY)
Admission: EM | Admit: 2015-09-15 | Discharge: 2015-09-15 | Disposition: A | Discharge status: Home / Self Care | Payer: Medicare Other | Attending: Emergency Medicine | Admitting: Emergency Medicine

## 2015-09-15 ENCOUNTER — Emergency Department (HOSPITAL_COMMUNITY): Payer: Medicare Other

## 2015-09-15 DIAGNOSIS — R1010 Upper abdominal pain, unspecified: Secondary | ICD-10-CM

## 2015-09-15 DIAGNOSIS — F419 Anxiety disorder, unspecified: Secondary | ICD-10-CM | POA: Insufficient documentation

## 2015-09-15 DIAGNOSIS — R109 Unspecified abdominal pain: Secondary | ICD-10-CM | POA: Diagnosis not present

## 2015-09-15 DIAGNOSIS — M1990 Unspecified osteoarthritis, unspecified site: Secondary | ICD-10-CM | POA: Insufficient documentation

## 2015-09-15 DIAGNOSIS — Z880 Allergy status to penicillin: Secondary | ICD-10-CM | POA: Diagnosis not present

## 2015-09-15 DIAGNOSIS — F17210 Nicotine dependence, cigarettes, uncomplicated: Secondary | ICD-10-CM | POA: Diagnosis not present

## 2015-09-15 DIAGNOSIS — Z79899 Other long term (current) drug therapy: Secondary | ICD-10-CM | POA: Insufficient documentation

## 2015-09-15 DIAGNOSIS — I10 Essential (primary) hypertension: Secondary | ICD-10-CM | POA: Diagnosis not present

## 2015-09-15 DIAGNOSIS — K5900 Constipation, unspecified: Secondary | ICD-10-CM | POA: Diagnosis not present

## 2015-09-15 DIAGNOSIS — Z862 Personal history of diseases of the blood and blood-forming organs and certain disorders involving the immune mechanism: Secondary | ICD-10-CM | POA: Insufficient documentation

## 2015-09-15 DIAGNOSIS — R110 Nausea: Secondary | ICD-10-CM | POA: Insufficient documentation

## 2015-09-15 LAB — CBC WITH DIFFERENTIAL/PLATELET
BASOS PCT: 0 %
Basophils Absolute: 0 10*3/uL (ref 0.0–0.1)
Eosinophils Absolute: 0.2 10*3/uL (ref 0.0–0.7)
Eosinophils Relative: 1 %
HEMATOCRIT: 40.7 % (ref 39.0–52.0)
HEMOGLOBIN: 13.5 g/dL (ref 13.0–17.0)
LYMPHS ABS: 2.9 10*3/uL (ref 0.7–4.0)
Lymphocytes Relative: 23 %
MCH: 27.8 pg (ref 26.0–34.0)
MCHC: 33.2 g/dL (ref 30.0–36.0)
MCV: 83.9 fL (ref 78.0–100.0)
Monocytes Absolute: 0.7 10*3/uL (ref 0.1–1.0)
Monocytes Relative: 5 %
NEUTROS ABS: 8.9 10*3/uL — AB (ref 1.7–7.7)
NEUTROS PCT: 71 %
Platelets: 336 10*3/uL (ref 150–400)
RBC: 4.85 MIL/uL (ref 4.22–5.81)
RDW: 15.1 % (ref 11.5–15.5)
WBC: 12.7 10*3/uL — AB (ref 4.0–10.5)

## 2015-09-15 LAB — COMPREHENSIVE METABOLIC PANEL
ALBUMIN: 3.7 g/dL (ref 3.5–5.0)
ALK PHOS: 76 U/L (ref 38–126)
ALT: 24 U/L (ref 17–63)
ANION GAP: 9 (ref 5–15)
AST: 25 U/L (ref 15–41)
BILIRUBIN TOTAL: 0.3 mg/dL (ref 0.3–1.2)
BUN: 7 mg/dL (ref 6–20)
CALCIUM: 9.5 mg/dL (ref 8.9–10.3)
CO2: 26 mmol/L (ref 22–32)
Chloride: 103 mmol/L (ref 101–111)
Creatinine, Ser: 0.88 mg/dL (ref 0.61–1.24)
GFR calc Af Amer: >60 mL/min (ref >60)
GFR calc non Af Amer: >60 mL/min (ref >60)
GLUCOSE: 102 mg/dL — AB (ref 65–99)
Potassium: 3.5 mmol/L (ref 3.5–5.1)
Sodium: 138 mmol/L (ref 135–145)
TOTAL PROTEIN: 7 g/dL (ref 6.5–8.1)

## 2015-09-15 LAB — LIPASE, BLOOD: Lipase: 27 U/L (ref 11–51)

## 2015-09-15 MED ORDER — HYDROMORPHONE HCL 1 MG/ML IJ SOLN
1.0000 mg | Freq: Once | INTRAMUSCULAR | Status: AC
  Administered 2015-09-15: 1 mg via INTRAVENOUS
  Filled 2015-09-15: qty 1

## 2015-09-15 MED ORDER — IOHEXOL 300 MG/ML  SOLN
100.0000 mL | Freq: Once | INTRAMUSCULAR | Status: AC | PRN
  Administered 2015-09-15: 100 mL via INTRAVENOUS

## 2015-09-15 MED ORDER — ONDANSETRON HCL 4 MG PO TABS: 4.0000 mg | ORAL_TABLET | Freq: Four times a day (QID) | ORAL | Status: DC

## 2015-09-15 MED ORDER — ONDANSETRON HCL 4 MG/2ML IJ SOLN
4.0000 mg | Freq: Once | INTRAMUSCULAR | Status: AC
  Administered 2015-09-15: 4 mg via INTRAVENOUS
  Filled 2015-09-15: qty 2

## 2015-09-15 NOTE — ED Notes (Signed)
Patient undressed, in gown, on continuous pulse oximetry and blood pressure cuff; warm blankets given 

## 2015-09-15 NOTE — ED Provider Notes (Signed)
CSN: [id number]     Arrival date & time 09/15/15  0840 History   First MD Initiated Contact with Patient 09/15/15 [id number]     Chief Complaint  Patient presents with  . Abdominal Pain    (Consider location/radiation/quality/duration/timing/severity/associated sxs/prior Treatment) Patient is a 54 y.o. male presenting with abdominal pain. The history is provided by the patient. No language interpreter was used.  Abdominal Pain Associated symptoms: constipation and nausea   Associated symptoms: no chest pain, no diarrhea, no fever, no shortness of breath and no vomiting    [name] [name] is a 54 year old male with a history of GERD, hypertension, bowel obstruction x 3, partial colectomy, and appendectomy who presents for abdominal pain, nausea, and constipation for the past 3 days. He denies any bloody bowel movements but states that he total Pepto-Bismol 2 days ago and had a difficult time with a bowel movement. He states it was very hard. He then took [employer] of magnesia and still had another hard bowel movement. He reports that he was able to pass gas yesterday it is no longer passing gas. He states this feels similar to his previous bowel obstructions. He denies any vomiting. He takes Percocet for chronic neck pain. He denies any fever, chills, chest pain, cough, shortness of breath, hematochezia, or urinary symptoms.  Past Medical History  Diagnosis Date  . Thyroid nodule   . Anxiety   . GERD (gastroesophageal reflux disease)   . Headache(784.0)     2 x/week  . Arthritis     neck, shoulder, left knee  . Essential hypertension     under control with med., has been on med. x 1 yr.  . 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 Medial meniscus tear 06/2013    left knee  . Limited joint range of motion     cervical fusion 02/2013  . Wears partial dentures     upper  . Leukocytosis, unspecified 11/03/2013  . Anemia   . Erythrocytosis 11/30/2014  . Tobacco abuse    Past Surgical History    Procedure Laterality Date  . Appendectomy    . Eye surgery Left     cataract  . Anterior cervical decomp/discectomy fusion N/A 03/18/2013    Procedure: ANTERIOR CERVICAL DECOMPRESSION/DISCECTOMY FUSION 3 LEVELS;  Surgeon: Erline Levine, MD;  Location: [facility];  Service: Neurosurgery;  Laterality: N/A;  Anterior Cervical Three-Six  Decompression/Diskectomy/Fusion  . Hemicolectomy Right [id number]    with ileocolic anastomosis  . Small intestine surgery  04/24/2000    adhesiolysis, ileocolic anastomosis  . Circumcision  05/04/2006  . Shoulder arthroscopy w/ rotator cuff repair Left 07/16/2010  . Inguinal hernia repair    . Knee arthroscopy Left 08/01/2013    Procedure: LEFT ARTHROSCOPY KNEE;  Surgeon: Kerin Salen, MD;  Location: [facility];  Service: Orthopedics;  Laterality: Left;  . Upper gastrointestinal endoscopy    . Colonoscopy     Family History  Problem Relation Age of Onset  . Cancer Father 55    colon ca - deceased.  . Colon cancer Father   . Cancer Sister 65    breast ca   Social History  Substance Use Topics  . Smoking status: Current Every Day Smoker -- 1.00 packs/day for 25 years    Types: Cigarettes  . Smokeless tobacco: Never Used  . Alcohol Use: Yes     Comment: occasionally    Review of Systems  Constitutional: Negative for fever.  Respiratory:  Negative for shortness of breath.   Cardiovascular: Negative for chest pain.  Gastrointestinal: Positive for nausea, abdominal pain and constipation. Negative for vomiting, diarrhea, blood in stool, abdominal distention and anal bleeding.  All other systems reviewed and are negative.     Allergies  Chantix; Morphine; and Penicillins  Home Medications   Prior to Admission medications   Medication Sig Start Date End Date Taking? Authorizing Provider  acetaminophen (TYLENOL) 500 MG tablet Take 500 mg by mouth every 6 (six) hours as needed.   Yes Historical Provider, MD  atenolol-chlorthalidone  (TENORETIC) 50-25 MG per tablet Take 1 tablet by mouth daily.   Yes Historical Provider, MD  atorvastatin (LIPITOR) 10 MG tablet Take 10 mg by mouth daily.   Yes Historical Provider, MD  baclofen (LIORESAL) 10 MG tablet Take 10 mg by mouth 2 (two) times daily as needed for muscle spasms.   Yes Historical Provider, MD  bismuth subsalicylate (PEPTO BISMOL) 262 MG/15ML suspension Take 30 mLs by mouth every 6 (six) hours as needed for indigestion.   Yes Historical Provider, MD  gabapentin (NEURONTIN) 100 MG capsule Take 100 mg by mouth 2 (two) times daily as needed (nerve pain).  11/14/14  Yes Historical Provider, MD  ibuprofen (ADVIL,MOTRIN) 200 MG tablet Take 200 mg by mouth every 6 (six) hours as needed.   Yes Historical Provider, MD  LORazepam (ATIVAN) 1 MG tablet Take 1 mg by mouth 2 (two) times daily between meals as needed for anxiety.   Yes Historical Provider, MD  magnesium hydroxide (MILK OF MAGNESIA) 400 MG/5ML suspension Take 60 mLs by mouth daily as needed for mild constipation.   Yes Historical Provider, MD  Multiple Vitamin (MULTIVITAMIN WITH MINERALS) TABS Take 1 tablet by mouth daily.   Yes Historical Provider, MD  Multiple Vitamins-Minerals (MULTIVITAMIN WITH MINERALS) tablet Take 1 tablet by mouth daily.   Yes Historical Provider, MD  omeprazole (PRILOSEC) 40 MG capsule Take 1 capsule (40 mg total) by mouth 2 (two) times daily. 07/22/13  Yes Theodis Blaze, MD  oxyCODONE-acetaminophen (PERCOCET) 10-325 MG per tablet Take 1-2 tablets by mouth every 6 (six) hours as needed for pain. 08/25/13  Yes Katheren Shams, MD  tadalafil (CIALIS) 20 MG tablet Take 20 mg by mouth daily as needed for erectile dysfunction.   Yes Historical Provider, MD  aspirin EC 81 MG EC tablet Take 1 tablet (81 mg total) by mouth daily. 03/06/15   Almyra Deforest, PA  ondansetron (ZOFRAN) 4 MG tablet Take 1 tablet (4 mg total) by mouth every 6 (six) hours. 09/15/15   Antonette Hendricks Patel-Mills, PA-C   BP 117/79 mmHg  Pulse 57   Temp(Src) 97.8 F (36.6 C) (Oral)  Resp 18  SpO2 97% Physical Exam  Constitutional: He is oriented to person, place, and time. He appears well-developed and well-nourished.  HENT:  Head: Normocephalic and atraumatic.  Eyes: Conjunctivae are normal.  Neck: Normal range of motion. Neck supple.  Cardiovascular: Normal rate, regular rhythm and normal heart sounds.   Pulmonary/Chest: Effort normal and breath sounds normal.  Lungs clear to auscultation bilaterally. Regular rate and rhythm.  Abdominal: Soft. He exhibits no distension. There is tenderness. There is no rebound and no guarding.    Significant right sided abdominal tenderness. No guarding or rebound. Multiple surgical scars on the abdomen. No abdominal distention.  Musculoskeletal: Normal range of motion.  Neurological: He is alert and oriented to person, place, and time.  Skin: Skin is warm and dry.  Nursing note and  vitals reviewed.   ED Course  Procedures (including critical care time) Labs Review Labs Reviewed  CBC WITH DIFFERENTIAL/PLATELET - Abnormal; Notable for the following:    WBC 12.7 (*)    Neutro Abs 8.9 (*)    All other components within normal limits  COMPREHENSIVE METABOLIC PANEL - Abnormal; Notable for the following:    Glucose, Bld 102 (*)    All other components within normal limits  LIPASE, BLOOD    Imaging Review Ct Abdomen Pelvis W Contrast  09/15/2015  CLINICAL DATA:  Pt here with c/o abd pain since Thursday. Pt also reports nausea. Pt has hx of bowel obstruction. He reports taking pepto bismol at home and milk of mag. LBM was last night but no pain relief EXAM: CT ABDOMEN AND PELVIS WITH CONTRAST TECHNIQUE: Multidetector CT imaging of the abdomen and pelvis was performed using the standard protocol following bolus administration of intravenous contrast. CONTRAST:  134mL OMNIPAQUE IOHEXOL 300 MG/ML  SOLN COMPARISON:  05/12/2014. FINDINGS: Lung bases: Mild scarring/subsegmental atelectasis. Lung  bases otherwise clear. Heart normal in size. Liver: Mild diffuse hepatic steatosis. No liver mass or focal lesion. Spleen, gallbladder, pancreas: Unremarkable. Adrenal glands: Mass in the inferior margin of the right adrenal gland measuring 2.6 cm. This has relatively high attenuation. It is stable from the prior CT. Normal left adrenal gland. Kidneys, ureters, bladder: Small nonobstructing stone in the upper pole of the left kidney. No renal masses. No hydronephrosis. Ureters normal in course and in caliber. Bladder is unremarkable. Lymph nodes:  No pathologically enlarged lymph nodes. Ascites: None. Gastrointestinal: Status post previous colon surgery with a right partial hemicolectomy. There are prominent mesenteric lymph nodes adjacent to the colonic anastomosis staple line in the right upper quadrant, none enlarged by size criteria. This appearance is stable from the prior exam. No bowel wall thickening or inflammatory changes. No bowel dilation is seen to suggest obstruction. Stomach and small bowel are unremarkable. Abdominal wall:  No hernia. Musculoskeletal:  Unremarkable. IMPRESSION: 1. No acute findings.  No evidence of bowel obstruction. 2. Status post right colon surgery, stable from the prior exam. No evidence of bowel inflammation. 3. 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 4. Small nonobstructing stone in the upper pole of the left kidney, stable from the prior study. Electronically Signed   By: Lajean Manes M.D.   On: 09/15/2015 11:42   I have personally reviewed and evaluated these images and lab results as part of my medical decision-making.   EKG Interpretation None      MDM   Final diagnoses:  Pain of upper abdomen  Patient presents for constipation, nausea, abdominal pain, and inability to pass gas since yesterday. He is concerned that he may have a bowel obstruction as he has had 3 in the  past. He is well-appearing and his vital signs are stable. His labs are unremarkable. CT abdomen is negative for bowel obstruction, colitis, cholecystitis, or pancreatitis. I do not know what is causing his pain but it does not seem to be a life-threatening cause. This may be musculoskeletal related. I discussed return precautions with the patient and gave him follow-up. I explained that he could take Tylenol for pain. He was also given Zofran. Patient verbally agreed to the plan. Medications  HYDROmorphone (DILAUDID) injection 1 mg (1 mg Intravenous Given 09/15/15 0938)  ondansetron (ZOFRAN) injection 4 mg (4 mg Intravenous Given 09/15/15 0937)  HYDROmorphone (DILAUDID)  injection 1 mg (1 mg Intravenous Given 09/15/15 1049)  iohexol (OMNIPAQUE) 300 MG/ML solution 100 mL (100 mLs Intravenous Contrast Given 09/15/15 1111)        [name] Glazier, PA-C 09/15/15 1522  Charlesetta Shanks, MD 09/26/15 1500

## 2015-09-15 NOTE — Discharge Instructions (Signed)
Abdominal Pain, Adult Take Zofran for nausea. Follow up with GI. Take Tylenol for pain. Many things can cause abdominal pain. Usually, abdominal pain is not caused by a disease and will improve without treatment. It can often be observed and treated at home. Your health care provider will do a physical exam and possibly order blood tests and X-rays to help determine the seriousness of your pain. However, in many cases, more time must pass before a clear cause of the pain can be found. Before that point, your health care provider may not know if you need more testing or further treatment. HOME CARE INSTRUCTIONS Monitor your abdominal pain for any changes. The following actions may help to alleviate any discomfort you are experiencing:  Only take over-the-counter or prescription medicines as directed by your health care provider.  Do not take laxatives unless directed to do so by your health care provider.  Try a clear liquid diet (broth, tea, or water) as directed by your health care provider. Slowly move to a bland diet as tolerated. SEEK MEDICAL CARE IF:  You have unexplained abdominal pain.  You have abdominal pain associated with nausea or diarrhea.  You have pain when you urinate or have a bowel movement.  You experience abdominal pain that wakes you in the night.  You have abdominal pain that is worsened or improved by eating food.  You have abdominal pain that is worsened with eating fatty foods.  You have a fever. SEEK IMMEDIATE MEDICAL CARE IF:  Your pain does not go away within 2 hours.  You keep throwing up (vomiting).  Your pain is felt only in portions of the abdomen, such as the right side or the left lower portion of the abdomen.  You pass bloody or black tarry stools. MAKE SURE YOU:  Understand these instructions.  Will watch your condition.  Will get help right away if you are not doing well or get worse.   This information is not intended to replace advice  given to you by your health care provider. Make sure you discuss any questions you have with your health care provider.   Document Released: 07/23/2005 Document Revised: 07/04/2015 Document Reviewed: 06/22/2013 Elsevier Interactive Patient Education [employer].

## 2015-09-15 NOTE — ED Notes (Signed)
Pt here with c/o abd pain since Thursday. Pt also reports nausea. Pt has hx of bowel obstruction. He reports taking pepto bismol at home and milk of mag. LBM was last night but no pain relief.

## 2015-09-17 ENCOUNTER — Other Ambulatory Visit: Payer: Self-pay | Admitting: Neurosurgery

## 2015-09-17 DIAGNOSIS — M4722 Other spondylosis with radiculopathy, cervical region: Secondary | ICD-10-CM

## 2015-10-02 ENCOUNTER — Ambulatory Visit
Admission: RE | Admit: 2015-10-02 | Discharge: 2015-10-02 | Disposition: A | Discharge status: Home / Self Care | Payer: Medicare Other | Source: Ambulatory Visit | Attending: Neurosurgery | Admitting: Neurosurgery

## 2015-10-02 DIAGNOSIS — M4722 Other spondylosis with radiculopathy, cervical region: Secondary | ICD-10-CM

## 2015-10-02 DIAGNOSIS — M4802 Spinal stenosis, cervical region: Secondary | ICD-10-CM | POA: Diagnosis not present

## 2015-10-05 ENCOUNTER — Emergency Department (HOSPITAL_COMMUNITY)
Admission: EM | Admit: 2015-10-05 | Discharge: 2015-10-05 | Disposition: A | Discharge status: Home / Self Care | Payer: Medicare Other | Attending: Emergency Medicine | Admitting: Emergency Medicine

## 2015-10-05 ENCOUNTER — Emergency Department (HOSPITAL_COMMUNITY): Payer: Medicare Other

## 2015-10-05 ENCOUNTER — Encounter (HOSPITAL_COMMUNITY): Payer: Self-pay | Admitting: Emergency Medicine

## 2015-10-05 DIAGNOSIS — Y9389 Activity, other specified: Secondary | ICD-10-CM | POA: Diagnosis not present

## 2015-10-05 DIAGNOSIS — Z880 Allergy status to penicillin: Secondary | ICD-10-CM | POA: Diagnosis not present

## 2015-10-05 DIAGNOSIS — F17210 Nicotine dependence, cigarettes, uncomplicated: Secondary | ICD-10-CM | POA: Diagnosis not present

## 2015-10-05 DIAGNOSIS — Y998 Other external cause status: Secondary | ICD-10-CM | POA: Diagnosis not present

## 2015-10-05 DIAGNOSIS — K219 Gastro-esophageal reflux disease without esophagitis: Secondary | ICD-10-CM | POA: Diagnosis not present

## 2015-10-05 DIAGNOSIS — Z79899 Other long term (current) drug therapy: Secondary | ICD-10-CM | POA: Insufficient documentation

## 2015-10-05 DIAGNOSIS — M549 Dorsalgia, unspecified: Secondary | ICD-10-CM

## 2015-10-05 DIAGNOSIS — Z7982 Long term (current) use of aspirin: Secondary | ICD-10-CM | POA: Diagnosis not present

## 2015-10-05 DIAGNOSIS — Y9289 Other specified places as the place of occurrence of the external cause: Secondary | ICD-10-CM | POA: Diagnosis not present

## 2015-10-05 DIAGNOSIS — G8929 Other chronic pain: Secondary | ICD-10-CM | POA: Insufficient documentation

## 2015-10-05 DIAGNOSIS — I10 Essential (primary) hypertension: Secondary | ICD-10-CM | POA: Diagnosis not present

## 2015-10-05 DIAGNOSIS — Z8639 Personal history of other endocrine, nutritional and metabolic disease: Secondary | ICD-10-CM | POA: Insufficient documentation

## 2015-10-05 DIAGNOSIS — Z862 Personal history of diseases of the blood and blood-forming organs and certain disorders involving the immune mechanism: Secondary | ICD-10-CM | POA: Diagnosis not present

## 2015-10-05 DIAGNOSIS — X58XXXA Exposure to other specified factors, initial encounter: Secondary | ICD-10-CM | POA: Diagnosis not present

## 2015-10-05 DIAGNOSIS — S3992XA Unspecified injury of lower back, initial encounter: Secondary | ICD-10-CM | POA: Insufficient documentation

## 2015-10-05 DIAGNOSIS — M545 Low back pain: Secondary | ICD-10-CM | POA: Diagnosis not present

## 2015-10-05 DIAGNOSIS — M1990 Unspecified osteoarthritis, unspecified site: Secondary | ICD-10-CM | POA: Insufficient documentation

## 2015-10-05 DIAGNOSIS — F419 Anxiety disorder, unspecified: Secondary | ICD-10-CM | POA: Diagnosis not present

## 2015-10-05 MED ORDER — METHOCARBAMOL 500 MG PO TABS: 500.0000 mg | ORAL_TABLET | Freq: Two times a day (BID) | ORAL | Status: DC

## 2015-10-05 MED ORDER — OXYCODONE-ACETAMINOPHEN 5-325 MG PO TABS
2.0000 | ORAL_TABLET | Freq: Once | ORAL | Status: AC
  Administered 2015-10-05: 2 via ORAL
  Filled 2015-10-05: qty 2

## 2015-10-05 NOTE — ED Notes (Signed)
Pt provided with heating packs and placed on pts lower back in bed.

## 2015-10-05 NOTE — ED Provider Notes (Signed)
CSN: [id number]     Arrival date & time 10/05/15  1711 History  By signing my name below, I, Soijett Blue, attest that this documentation has been prepared under the direction and in the presence of Quincy Carnes, PA-C Electronically Signed: Soijett Blue, ED Scribe. 10/05/2015. 5:38 PM.   Chief Complaint  Patient presents with  . Back Pain    The history is provided by the patient. No language interpreter was used.   HPI Comments: [name] [name] is a 54 y.o. male with a medical hx of chronic neck and upper back pain, who presents to the Emergency Department complaining of low back pain onset today. He notes that he was wiping a table off today and he felt a pop to his lower back. He reports that the low back pain does radiate to his buttocks. He reports that his chronic back pain is different from the pain that he is currently experiencing. He states that his low back pain is worsened with ambulation and movement and nothing alleviates his symptoms. He notes that his is having associated symptoms of gait problem due to pain. He states that he has tried ativan PTA without any pain medications. with no relief for his symptoms. Pt denies leg pain, bowel/bladder incontinence, fever, chills, CP, SOB, abdominal pain, n/v/d/c, hematuria, dysuria, numbness, tingling, weakness, and any other symptoms. He notes that his is allergic to morphine. Dr. [name] is the pt neurosurgeon.   Past Medical History  Diagnosis Date  . Thyroid nodule   . Anxiety   . GERD (gastroesophageal reflux disease)   . Headache(784.0)     2 x/week  . Arthritis     neck, shoulder, left knee  . Essential hypertension     under control with med., has been on med. x 1 yr.  . 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 Medial meniscus tear 06/2013    left knee  . Limited joint range of motion     cervical fusion 02/2013  . Wears partial dentures     upper  . Leukocytosis, unspecified 11/03/2013  . Anemia   . Erythrocytosis 11/30/2014   . Tobacco abuse    Past Surgical History  Procedure Laterality Date  . Appendectomy    . Eye surgery Left     cataract  . Anterior cervical decomp/discectomy fusion N/A 03/18/2013    Procedure: ANTERIOR CERVICAL DECOMPRESSION/DISCECTOMY FUSION 3 LEVELS;  Surgeon: Erline Levine, MD;  Location: [facility];  Service: Neurosurgery;  Laterality: N/A;  Anterior Cervical Three-Six  Decompression/Diskectomy/Fusion  . Hemicolectomy Right 123456    with ileocolic anastomosis  . Small intestine surgery  04/24/2000    adhesiolysis, ileocolic anastomosis  . Circumcision  05/04/2006  . Shoulder arthroscopy w/ rotator cuff repair Left 07/16/2010  . Inguinal hernia repair    . Knee arthroscopy Left 08/01/2013    Procedure: LEFT ARTHROSCOPY KNEE;  Surgeon: Kerin Salen, MD;  Location: [facility];  Service: Orthopedics;  Laterality: Left;  . Upper gastrointestinal endoscopy    . Colonoscopy     Family History  Problem Relation Age of Onset  . Cancer Father 20    colon ca - deceased.  . Colon cancer Father   . Cancer Sister 67    breast ca   Social History  Substance Use Topics  . Smoking status: Current Every Day Smoker -- 1.00 packs/day for 25 years    Types: Cigarettes  . Smokeless tobacco: Never Used  .  Alcohol Use: Yes     Comment: occasionally    Review of Systems  Constitutional: Negative for fever and chills.  Gastrointestinal: Negative for nausea, vomiting and abdominal pain.       No bowel incontinence  Genitourinary: Negative for dysuria, hematuria and difficulty urinating.       No bladder incontinence  Musculoskeletal: Positive for back pain and gait problem (due to pain). Negative for joint swelling.  Skin: Negative for color change, rash and wound.  Neurological: Negative for weakness and numbness.       No tingling  All other systems reviewed and are negative.     Allergies  Chantix; Morphine; and Penicillins  Home Medications   Prior to  Admission medications   Medication Sig Start Date End Date Taking? Authorizing Provider  acetaminophen (TYLENOL) 500 MG tablet Take 500 mg by mouth every 6 (six) hours as needed.    Historical Provider, MD  aspirin EC 81 MG EC tablet Take 1 tablet (81 mg total) by mouth daily. 03/06/15   Almyra Deforest, PA  atenolol-chlorthalidone (TENORETIC) 50-25 MG per tablet Take 1 tablet by mouth daily.    Historical Provider, MD  atorvastatin (LIPITOR) 10 MG tablet Take 10 mg by mouth daily.    Historical Provider, MD  baclofen (LIORESAL) 10 MG tablet Take 10 mg by mouth 2 (two) times daily as needed for muscle spasms.    Historical Provider, MD  bismuth subsalicylate (PEPTO BISMOL) 262 MG/15ML suspension Take 30 mLs by mouth every 6 (six) hours as needed for indigestion.    Historical Provider, MD  gabapentin (NEURONTIN) 100 MG capsule Take 100 mg by mouth 2 (two) times daily as needed (nerve pain).  11/14/14   Historical Provider, MD  ibuprofen (ADVIL,MOTRIN) 200 MG tablet Take 200 mg by mouth every 6 (six) hours as needed.    Historical Provider, MD  LORazepam (ATIVAN) 1 MG tablet Take 1 mg by mouth 2 (two) times daily between meals as needed for anxiety.    Historical Provider, MD  magnesium hydroxide (MILK OF MAGNESIA) 400 MG/5ML suspension Take 60 mLs by mouth daily as needed for mild constipation.    Historical Provider, MD  Multiple Vitamin (MULTIVITAMIN WITH MINERALS) TABS Take 1 tablet by mouth daily.    Historical Provider, MD  Multiple Vitamins-Minerals (MULTIVITAMIN WITH MINERALS) tablet Take 1 tablet by mouth daily.    Historical Provider, MD  omeprazole (PRILOSEC) 40 MG capsule Take 1 capsule (40 mg total) by mouth 2 (two) times daily. 07/22/13   Theodis Blaze, MD  ondansetron (ZOFRAN) 4 MG tablet Take 1 tablet (4 mg total) by mouth every 6 (six) hours. 09/15/15   Hanna Patel-Mills, PA-C  oxyCODONE-acetaminophen (PERCOCET) 10-325 MG per tablet Take 1-2 tablets by mouth every 6 (six) hours as needed for  pain. 08/25/13   Katheren Shams, MD  tadalafil (CIALIS) 20 MG tablet Take 20 mg by mouth daily as needed for erectile dysfunction.    Historical Provider, MD   BP 152/82 mmHg  Pulse 73  Temp(Src) 97.4 F (36.3 C) (Oral)  Resp 22  SpO2 100%   Physical Exam  Constitutional: He is oriented to person, place, and time. He appears well-developed and well-nourished. No distress.  HENT:  Head: Normocephalic and atraumatic.  Mouth/Throat: Oropharynx is clear and moist.  Eyes: Conjunctivae and EOM are normal. Pupils are equal, round, and reactive to light.  Neck: Normal range of motion. Neck supple.  Cardiovascular: Normal rate, regular rhythm and normal heart sounds.  Pulmonary/Chest: Effort normal and breath sounds normal. No respiratory distress. He has no wheezes.  Abdominal: Soft. Bowel sounds are normal. There is no tenderness. There is no guarding.  Musculoskeletal: Normal range of motion.  Midline TTP of LS without deformity or step-off; limited ROM due to pain and poor patient effort; normal strength and sensation of BLE; gait not tested  Neurological: He is alert and oriented to person, place, and time.  Skin: Skin is warm and dry. He is not diaphoretic.  Psychiatric: He has a normal mood and affect.  Nursing note and vitals reviewed.   ED Course  Procedures (including critical care time) DIAGNOSTIC STUDIES: Oxygen Saturation is 100% on RA, nl by my interpretation.    COORDINATION OF CARE: 5:38 PM Discussed treatment plan with pt at bedside which includes lumbar spine xray and pt agreed to plan.    Labs Review Labs Reviewed - No data to display  Imaging Review Dg Lumbar Spine Complete  10/05/2015  CLINICAL DATA:  Acute onset low back pain while bending over. Initial encounter. EXAM: LUMBAR SPINE - COMPLETE 4+ VIEW COMPARISON:  06/21/2014 FINDINGS: There is no evidence of lumbar spine fracture. Alignment is normal. Intervertebral disc spaces are maintained. No other bone  abnormality identified. Atherosclerotic calcification of abdominal aorta noted. IMPRESSION: Negative lumbar spine radiographs. Electronically Signed   By: Earle Gell M.D.   On: 10/05/2015 18:29   I have personally reviewed and evaluated these images as part of my medical decision-making.   EKG Interpretation None      MDM   Final diagnoses:  Back pain, unspecified location   54 y.o. M here with low back pain after wiping table, states he felt a "pop" in his back.  Hx of chronic neck and upper back pain.  TTP of LS on exam without deformity.  Limited ROM due to pain and very poor patient effort.  No red flag sx or focal neurologic deficits to suggest cauda equina, SCI, or other acute spinal pathology.  X-ray was obtained, no acute findings.  Patient has follow-up with his pain management doctor on Monday.  Will add robaxin to home regimen of oxycodone and baclofen.  Discussed plan with patient, he/she acknowledged understanding and agreed with plan of care.  Return precautions given for new or worsening symptoms.  I personally performed the services described in this documentation, which was scribed in my presence. The recorded information has been reviewed and is accurate.  Larene Pickett, PA-C 10/05/15 1942  Dorie Rank, MD 10/06/15 [phone]

## 2015-10-05 NOTE — ED Notes (Signed)
Pt here for evaluation of lower back pain.  States he was wiping the table off today and "felt something pop" in his lower back.  Pt has chronic neck and upper back pain, but states this is completely different.

## 2015-10-05 NOTE — Discharge Instructions (Signed)
Take the prescribed medication as directed.  You may continue taking your baclofen and oxycodone with this. Follow-up with your pain management doctor on Monday as scheduled. Return to the ED for new or worsening symptoms.  Back Pain, Adult Back pain is very common in adults.The cause of back pain is rarely dangerous and the pain often gets better over time.The cause of your back pain may not be known. Some common causes of back pain include:  Strain of the muscles or ligaments supporting the spine.  Wear and tear (degeneration) of the spinal disks.  Arthritis.  Direct injury to the back. For many people, back pain may return. Since back pain is rarely dangerous, most people can learn to manage this condition on their own. HOME CARE INSTRUCTIONS Watch your back pain for any changes. The following actions may help to lessen any discomfort you are feeling:  Remain active. It is stressful on your back to sit or stand in one place for long periods of time. Do not sit, drive, or stand in one place for more than 30 minutes at a time. Take short walks on even surfaces as soon as you are able.Try to increase the length of time you walk each day.  Exercise regularly as directed by your health care provider. Exercise helps your back heal faster. It also helps avoid future injury by keeping your muscles strong and flexible.  Do not stay in bed.Resting more than 1-2 days can delay your recovery.  Pay attention to your body when you bend and lift. The most comfortable positions are those that put less stress on your recovering back. Always use proper lifting techniques, including:  Bending your knees.  Keeping the load close to your body.  Avoiding twisting.  Find a comfortable position to sleep. Use a firm mattress and lie on your side with your knees slightly bent. If you lie on your back, put a pillow under your knees.  Avoid feeling anxious or stressed.Stress increases muscle tension  and can worsen back pain.It is important to recognize when you are anxious or stressed and learn ways to manage it, such as with exercise.  Take medicines only as directed by your health care provider. Over-the-counter medicines to reduce pain and inflammation are often the most helpful.Your health care provider may prescribe muscle relaxant drugs.These medicines help dull your pain so you can more quickly return to your normal activities and healthy exercise.  Apply ice to the injured area:  Put ice in a plastic bag.  Place a towel between your skin and the bag.  Leave the ice on for 20 minutes, 2-3 times a day for the first 2-3 days. After that, ice and heat may be alternated to reduce pain and spasms.  Maintain a healthy weight. Excess weight puts extra stress on your back and makes it difficult to maintain good posture. SEEK MEDICAL CARE IF:  You have pain that is not relieved with rest or medicine.  You have increasing pain going down into the legs or buttocks.  You have pain that does not improve in one week.  You have night pain.  You lose weight.  You have a fever or chills. SEEK IMMEDIATE MEDICAL CARE IF:   You develop new bowel or bladder control problems.  You have unusual weakness or numbness in your arms or legs.  You develop nausea or vomiting.  You develop abdominal pain.  You feel faint.   This information is not intended to replace advice given  to you by your health care provider. Make sure you discuss any questions you have with your health care provider.   Document Released: 10/13/2005 Document Revised: 11/03/2014 Document Reviewed: 02/14/2014 Elsevier Interactive Patient Education [employer].

## 2015-10-08 DIAGNOSIS — M5030 Other cervical disc degeneration, unspecified cervical region: Secondary | ICD-10-CM | POA: Diagnosis not present

## 2015-10-08 DIAGNOSIS — M545 Low back pain: Secondary | ICD-10-CM | POA: Diagnosis not present

## 2015-10-08 DIAGNOSIS — M5416 Radiculopathy, lumbar region: Secondary | ICD-10-CM | POA: Diagnosis not present

## 2015-10-08 DIAGNOSIS — M791 Myalgia: Secondary | ICD-10-CM | POA: Diagnosis not present

## 2015-10-08 DIAGNOSIS — M5412 Radiculopathy, cervical region: Secondary | ICD-10-CM | POA: Diagnosis not present

## 2015-10-08 DIAGNOSIS — M4722 Other spondylosis with radiculopathy, cervical region: Secondary | ICD-10-CM | POA: Diagnosis not present

## 2015-10-08 DIAGNOSIS — M5440 Lumbago with sciatica, unspecified side: Secondary | ICD-10-CM | POA: Diagnosis not present

## 2015-10-09 ENCOUNTER — Other Ambulatory Visit: Payer: Self-pay | Admitting: Neurosurgery

## 2015-10-09 DIAGNOSIS — M5416 Radiculopathy, lumbar region: Secondary | ICD-10-CM

## 2015-10-11 DIAGNOSIS — M545 Low back pain: Secondary | ICD-10-CM | POA: Diagnosis not present

## 2015-10-11 DIAGNOSIS — M5440 Lumbago with sciatica, unspecified side: Secondary | ICD-10-CM | POA: Diagnosis not present

## 2015-11-09 DIAGNOSIS — E785 Hyperlipidemia, unspecified: Secondary | ICD-10-CM | POA: Diagnosis not present

## 2015-12-06 DIAGNOSIS — Z6827 Body mass index (BMI) 27.0-27.9, adult: Secondary | ICD-10-CM | POA: Diagnosis not present

## 2015-12-06 DIAGNOSIS — M4722 Other spondylosis with radiculopathy, cervical region: Secondary | ICD-10-CM | POA: Diagnosis not present

## 2015-12-06 DIAGNOSIS — M5440 Lumbago with sciatica, unspecified side: Secondary | ICD-10-CM | POA: Diagnosis not present

## 2015-12-06 DIAGNOSIS — M791 Myalgia: Secondary | ICD-10-CM | POA: Diagnosis not present

## 2015-12-06 DIAGNOSIS — G4489 Other headache syndrome: Secondary | ICD-10-CM | POA: Diagnosis not present

## 2015-12-19 DIAGNOSIS — Z79899 Other long term (current) drug therapy: Secondary | ICD-10-CM | POA: Diagnosis not present

## 2015-12-19 DIAGNOSIS — I10 Essential (primary) hypertension: Secondary | ICD-10-CM | POA: Diagnosis not present

## 2015-12-19 DIAGNOSIS — N5201 Erectile dysfunction due to arterial insufficiency: Secondary | ICD-10-CM | POA: Diagnosis not present

## 2015-12-19 DIAGNOSIS — F17210 Nicotine dependence, cigarettes, uncomplicated: Secondary | ICD-10-CM | POA: Diagnosis not present

## 2015-12-19 DIAGNOSIS — E785 Hyperlipidemia, unspecified: Secondary | ICD-10-CM | POA: Diagnosis not present

## 2015-12-19 DIAGNOSIS — Z0001 Encounter for general adult medical examination with abnormal findings: Secondary | ICD-10-CM | POA: Diagnosis not present

## 2015-12-19 DIAGNOSIS — F411 Generalized anxiety disorder: Secondary | ICD-10-CM | POA: Diagnosis not present

## 2015-12-19 DIAGNOSIS — E041 Nontoxic single thyroid nodule: Secondary | ICD-10-CM | POA: Diagnosis not present

## 2015-12-19 DIAGNOSIS — Z125 Encounter for screening for malignant neoplasm of prostate: Secondary | ICD-10-CM | POA: Diagnosis not present

## 2015-12-19 DIAGNOSIS — D649 Anemia, unspecified: Secondary | ICD-10-CM | POA: Diagnosis not present

## 2015-12-20 ENCOUNTER — Other Ambulatory Visit: Payer: Self-pay | Admitting: Family Medicine

## 2015-12-20 DIAGNOSIS — E041 Nontoxic single thyroid nodule: Secondary | ICD-10-CM

## 2015-12-26 ENCOUNTER — Ambulatory Visit
Admission: RE | Admit: 2015-12-26 | Discharge: 2015-12-26 | Disposition: A | Discharge status: Home / Self Care | Payer: Medicare Other | Source: Ambulatory Visit | Attending: Family Medicine | Admitting: Family Medicine

## 2015-12-26 DIAGNOSIS — E041 Nontoxic single thyroid nodule: Secondary | ICD-10-CM

## 2015-12-26 DIAGNOSIS — E042 Nontoxic multinodular goiter: Secondary | ICD-10-CM | POA: Diagnosis not present

## 2016-01-17 ENCOUNTER — Ambulatory Visit: Payer: Medicare Other | Admitting: Endocrinology

## 2016-01-17 ENCOUNTER — Encounter: Payer: Self-pay | Admitting: Endocrinology

## 2016-01-17 ENCOUNTER — Ambulatory Visit (INDEPENDENT_AMBULATORY_CARE_PROVIDER_SITE_OTHER): Payer: Medicare Other | Admitting: Endocrinology

## 2016-01-17 VITALS — BP 128/70 | HR 71 | Temp 97.7°F | Resp 12 | Ht 71.0 in | Wt 194.2 lb

## 2016-01-17 DIAGNOSIS — E0590 Thyrotoxicosis, unspecified without thyrotoxic crisis or storm: Secondary | ICD-10-CM | POA: Diagnosis not present

## 2016-01-17 DIAGNOSIS — E042 Nontoxic multinodular goiter: Secondary | ICD-10-CM

## 2016-01-17 MED ORDER — METHIMAZOLE 5 MG PO TABS: 5.0000 mg | ORAL_TABLET | ORAL | Status: DC

## 2016-01-17 NOTE — Progress Notes (Signed)
Subjective:    Patient ID: [name] [name], male    DOB: [birth year], 55 y.o.   MRN: [id number]  HPI In 2014, pt was noted to have a thyroid nodule.  he has no h/o XRT.  He has slight sensation of phlegm in the neck, but no assoc pain.   Past Medical History  Diagnosis Date  . Thyroid nodule   . Anxiety   . GERD (gastroesophageal reflux disease)   . Headache(784.0)     2 x/week  . Arthritis     neck, shoulder, left knee  . Essential hypertension     under control with med., has been on med. x 1 yr.  . 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 Medial meniscus tear 06/2013    left knee  . Limited joint range of motion     cervical fusion 02/2013  . Wears partial dentures     upper  . Leukocytosis, unspecified 11/03/2013  . Anemia   . Erythrocytosis 11/30/2014  . Tobacco abuse     Past Surgical History  Procedure Laterality Date  . Appendectomy    . Eye surgery Left     cataract  . Anterior cervical decomp/discectomy fusion N/A 03/18/2013    Procedure: ANTERIOR CERVICAL DECOMPRESSION/DISCECTOMY FUSION 3 LEVELS;  Surgeon: Erline Levine, MD;  Location: [facility];  Service: Neurosurgery;  Laterality: N/A;  Anterior Cervical Three-Six  Decompression/Diskectomy/Fusion  . Hemicolectomy Right 123456    with ileocolic anastomosis  . Small intestine surgery  04/24/2000    adhesiolysis, ileocolic anastomosis  . Circumcision  05/04/2006  . Shoulder arthroscopy w/ rotator cuff repair Left 07/16/2010  . Inguinal hernia repair    . Knee arthroscopy Left 08/01/2013    Procedure: LEFT ARTHROSCOPY KNEE;  Surgeon: Kerin Salen, MD;  Location: [facility];  Service: Orthopedics;  Laterality: Left;  . Upper gastrointestinal endoscopy    . Colonoscopy      Social History   Social History  . Marital Status: Married    Spouse Name: N/A  . Number of Children: N/A  . Years of Education: N/A   Occupational History  . Not on file.   Social History Main Topics  . Smoking  status: Current Every Day Smoker -- 1.00 packs/day for 25 years    Types: Cigarettes  . Smokeless tobacco: Never Used  . Alcohol Use: Yes     Comment: occasionally  . Drug Use: No  . Sexual Activity: Not on file   Other Topics Concern  . Not on file   Social History Narrative   Lives in [place] with his wife.    Current Outpatient Prescriptions on File Prior to Visit  Medication Sig Dispense Refill  . acetaminophen (TYLENOL) 500 MG tablet Take 500 mg by mouth every 6 (six) hours as needed.    Marland Kitchen atenolol-chlorthalidone (TENORETIC) 50-25 MG per tablet Take 1 tablet by mouth daily.    Marland Kitchen atorvastatin (LIPITOR) 10 MG tablet Take 10 mg by mouth daily.    . baclofen (LIORESAL) 10 MG tablet Take 10 mg by mouth 2 (two) times daily as needed for muscle spasms.    Marland Kitchen gabapentin (NEURONTIN) 100 MG capsule Take 100 mg by mouth 2 (two) times daily as needed (nerve pain).     Marland Kitchen ibuprofen (ADVIL,MOTRIN) 200 MG tablet Take 200 mg by mouth every 6 (six) hours as needed.    . methocarbamol (ROBAXIN) 500 MG tablet Take 1 tablet (500 mg total) by  mouth 2 (two) times daily. 20 tablet 0  . Multiple Vitamin (MULTIVITAMIN WITH MINERALS) TABS Take 1 tablet by mouth daily.    Marland Kitchen oxyCODONE-acetaminophen (PERCOCET) 10-325 MG per tablet Take 1-2 tablets by mouth every 6 (six) hours as needed for pain. 30 tablet 0  . tadalafil (CIALIS) 20 MG tablet Take 20 mg by mouth daily as needed for erectile dysfunction.    Marland Kitchen aspirin EC 81 MG EC tablet Take 1 tablet (81 mg total) by mouth daily. (Patient not taking: Reported on 01/17/2016)    . omeprazole (PRILOSEC) 40 MG capsule Take 1 capsule (40 mg total) by mouth 2 (two) times daily. 60 capsule 5  . [DISCONTINUED] simethicone (MYLICON) 80 MG chewable tablet Chew 80 mg by mouth every 6 (six) hours as needed for flatulence.     No current facility-administered medications on file prior to visit.    Allergies  Allergen Reactions  . Chantix [Varenicline] Palpitations    Heart  race Increased panic attacks  . Morphine Itching  . Penicillins Rash    Family History  Problem Relation Age of Onset  . Cancer Father 6    colon ca - deceased.  . Colon cancer Father   . Cancer Sister 60    breast ca  . Thyroid disease Neg Hx     BP 128/70 mmHg  Pulse 71  Temp(Src) 97.7 F (36.5 C) (Oral)  Resp 12  Ht 5\' 11"  (1.803 m)  Wt 194 lb 3.2 oz (88.089 kg)  BMI 27.10 kg/m2  SpO2 97%   Review of Systems denies weight loss, hoarseness, visual loss, palpitations, sob, diarrhea, polyuria, muscle weakness, excessive diaphoresis, tremor, heat intolerance, easy bruising, and rhinorrhea.  He has chronic headache and anxiety.     Objective:   Physical Exam VS: see vs page GEN: no distress HEAD: head: no deformity eyes: no periorbital swelling, no proptosis external nose and ears are normal mouth: no lesion seen NECK: a healed scar is present (C-spine procedure).  i do not appreciate the thyroid nodule.   CHEST WALL: no deformity LUNGS: clear to auscultation BREASTS:  No gynecomastia CV: reg rate and rhythm, no murmur ABD: abdomen is soft, nontender.  no hepatosplenomegaly.  not distended.  no hernia MUSCULOSKELETAL: muscle bulk and strength are grossly normal.  no obvious joint swelling.  gait is normal and steady EXTEMITIES: no deformity.  no edema PULSES: no carotid bruit NEURO:  cn 2-12 grossly intact.   readily moves all 4's.  sensation is intact to touch all 4's.  No tremor SKIN:  Normal texture and temperature.  No rash or suspicious lesion is visible.   NODES:  None palpable at the neck PSYCH: alert, well-oriented.  Does not appear anxious nor depressed.   Lab Results  Component Value Date   TSH 0.327* 03/05/2015   [name]: small multinodular goiter  Thyroid Bx: NON-NEOPLASTIC GOITER.    Assessment & Plan:  Multinodular goiter, usually hereditary. hyperthyroidism, mild, new.  We discussed rx options.  he declines I-131 rx.  Patient is advised the  following: Patient Instructions  i have sent a prescription to your pharmacy, to slow the thyroid if ever you have fever while taking methimazole, stop it and call us, even if the reason is obvious, because of the risk of a rare side-effect. Please come back for a follow-up appointment in 2 months.  We can reconsider the radioactive iodine pill in then future if you wish.

## 2016-01-17 NOTE — Patient Instructions (Signed)
i have sent a prescription to your pharmacy, to slow the thyroid if ever you have fever while taking methimazole, stop it and call us, even if the reason is obvious, because of the risk of a rare side-effect. Please come back for a follow-up appointment in 2 months.  We can reconsider the radioactive iodine pill in then future if you wish.

## 2016-01-19 DIAGNOSIS — E042 Nontoxic multinodular goiter: Secondary | ICD-10-CM | POA: Insufficient documentation

## 2016-01-19 DIAGNOSIS — E0590 Thyrotoxicosis, unspecified without thyrotoxic crisis or storm: Secondary | ICD-10-CM | POA: Insufficient documentation

## 2016-01-25 ENCOUNTER — Ambulatory Visit: Payer: Medicare Other | Admitting: Endocrinology

## 2016-03-03 DIAGNOSIS — M791 Myalgia: Secondary | ICD-10-CM | POA: Diagnosis not present

## 2016-03-03 DIAGNOSIS — M4722 Other spondylosis with radiculopathy, cervical region: Secondary | ICD-10-CM | POA: Diagnosis not present

## 2016-03-03 DIAGNOSIS — M5440 Lumbago with sciatica, unspecified side: Secondary | ICD-10-CM | POA: Diagnosis not present

## 2016-03-03 DIAGNOSIS — M5416 Radiculopathy, lumbar region: Secondary | ICD-10-CM | POA: Diagnosis not present

## 2016-03-03 DIAGNOSIS — Z6827 Body mass index (BMI) 27.0-27.9, adult: Secondary | ICD-10-CM | POA: Diagnosis not present

## 2016-03-18 ENCOUNTER — Encounter: Payer: Self-pay | Admitting: Endocrinology

## 2016-03-18 ENCOUNTER — Ambulatory Visit (INDEPENDENT_AMBULATORY_CARE_PROVIDER_SITE_OTHER): Payer: Medicare Other | Admitting: Endocrinology

## 2016-03-18 VITALS — BP 138/94 | HR 76 | Temp 98.0°F | Ht 71.0 in | Wt 193.0 lb

## 2016-03-18 DIAGNOSIS — E0590 Thyrotoxicosis, unspecified without thyrotoxic crisis or storm: Secondary | ICD-10-CM

## 2016-03-18 DIAGNOSIS — R6889 Other general symptoms and signs: Secondary | ICD-10-CM

## 2016-03-18 DIAGNOSIS — R198 Other specified symptoms and signs involving the digestive system and abdomen: Secondary | ICD-10-CM | POA: Diagnosis not present

## 2016-03-18 DIAGNOSIS — M542 Cervicalgia: Secondary | ICD-10-CM | POA: Insufficient documentation

## 2016-03-18 LAB — CBC WITH DIFFERENTIAL/PLATELET
BASOS ABS: 0.1 10*3/uL (ref 0.0–0.1)
Basophils Relative: 0.4 % (ref 0.0–3.0)
EOS ABS: 0.1 10*3/uL (ref 0.0–0.7)
Eosinophils Relative: 0.9 % (ref 0.0–5.0)
HCT: 41.4 % (ref 39.0–52.0)
HEMOGLOBIN: 13.7 g/dL (ref 13.0–17.0)
LYMPHS ABS: 3.3 10*3/uL (ref 0.7–4.0)
Lymphocytes Relative: 21.2 % (ref 12.0–46.0)
MCHC: 33.1 g/dL (ref 30.0–36.0)
MCV: 82.8 fl (ref 78.0–100.0)
MONO ABS: 0.7 10*3/uL (ref 0.1–1.0)
Monocytes Relative: 4.5 % (ref 3.0–12.0)
NEUTROS PCT: 73 % (ref 43.0–77.0)
Neutro Abs: 11.5 10*3/uL — ABNORMAL HIGH (ref 1.4–7.7)
Platelets: 336 10*3/uL (ref 150.0–400.0)
RBC: 4.99 Mil/uL (ref 4.22–5.81)
RDW: 14.8 % (ref 11.5–15.5)
WBC: 15.8 10*3/uL — AB (ref 4.0–10.5)

## 2016-03-18 LAB — T4, FREE: FREE T4: 0.69 ng/dL (ref 0.60–1.60)

## 2016-03-18 LAB — TSH: TSH: 1.34 u[IU]/mL (ref 0.35–4.50)

## 2016-03-18 NOTE — Patient Instructions (Addendum)
blood tests are requested for you today.  We'll let you know about the results. Please see an ear-nose-throat specialist.  you will receive a phone call, about a day and time for an appointment if ever you have fever while taking methimazole, stop it and call us, even if the reason is obvious, because of the risk of a rare side-effect. Please come back for a follow-up appointment in 4 months.  We can reconsider the radioactive iodine pill in then future if you wish.

## 2016-03-18 NOTE — Progress Notes (Signed)
Subjective:    Patient ID: [name] [name], male    DOB: [birth year], 55 y.o.   MRN: [id number]  HPI Pt returns for f/u of mild hyperthyroidism (due to multinodular goiter; dx'ed 2014; he declines RAI).  since on tapazole, he has slight pain at the throat, and assoc fatigue.   Past Medical History  Diagnosis Date  . Thyroid nodule   . Anxiety   . GERD (gastroesophageal reflux disease)   . Headache(784.0)     2 x/week  . Arthritis     neck, shoulder, left knee  . Essential hypertension     under control with med., has been on med. x 1 yr.  . 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 Medial meniscus tear 06/2013    left knee  . Limited joint range of motion     cervical fusion 02/2013  . Wears partial dentures     upper  . Leukocytosis, unspecified 11/03/2013  . Anemia   . Erythrocytosis 11/30/2014  . Tobacco abuse     Past Surgical History  Procedure Laterality Date  . Appendectomy    . Eye surgery Left     cataract  . Anterior cervical decomp/discectomy fusion N/A 03/18/2013    Procedure: ANTERIOR CERVICAL DECOMPRESSION/DISCECTOMY FUSION 3 LEVELS;  Surgeon: Erline Levine, MD;  Location: [facility];  Service: Neurosurgery;  Laterality: N/A;  Anterior Cervical Three-Six  Decompression/Diskectomy/Fusion  . Hemicolectomy Right 123456    with ileocolic anastomosis  . Small intestine surgery  04/24/2000    adhesiolysis, ileocolic anastomosis  . Circumcision  05/04/2006  . Shoulder arthroscopy w/ rotator cuff repair Left 07/16/2010  . Inguinal hernia repair    . Knee arthroscopy Left 08/01/2013    Procedure: LEFT ARTHROSCOPY KNEE;  Surgeon: Kerin Salen, MD;  Location: [facility];  Service: Orthopedics;  Laterality: Left;  . Upper gastrointestinal endoscopy    . Colonoscopy      Social History   Social History  . Marital Status: Married    Spouse Name: N/A  . Number of Children: N/A  . Years of Education: N/A   Occupational History  . Not on file.    Social History Main Topics  . Smoking status: Current Every Day Smoker -- 1.00 packs/day for 25 years    Types: Cigarettes  . Smokeless tobacco: Never Used  . Alcohol Use: Yes     Comment: occasionally  . Drug Use: No  . Sexual Activity: Not on file   Other Topics Concern  . Not on file   Social History Narrative   Lives in [place] with his wife.    Current Outpatient Prescriptions on File Prior to Visit  Medication Sig Dispense Refill  . acetaminophen (TYLENOL) 500 MG tablet Take 500 mg by mouth every 6 (six) hours as needed.    Marland Kitchen atenolol-chlorthalidone (TENORETIC) 50-25 MG per tablet Take 1 tablet by mouth daily.    Marland Kitchen atorvastatin (LIPITOR) 10 MG tablet Take 10 mg by mouth daily.    . baclofen (LIORESAL) 10 MG tablet Take 10 mg by mouth 2 (two) times daily as needed for muscle spasms.    Marland Kitchen gabapentin (NEURONTIN) 100 MG capsule Take 100 mg by mouth 2 (two) times daily as needed (nerve pain).     . methimazole (TAPAZOLE) 5 MG tablet Take 1 tablet (5 mg total) by mouth 2 (two) times a week. 10 tablet 4  . methocarbamol (ROBAXIN) 500 MG tablet Take 1 tablet (500  mg total) by mouth 2 (two) times daily. 20 tablet 0  . Multiple Vitamin (MULTIVITAMIN WITH MINERALS) TABS Take 1 tablet by mouth daily.    Marland Kitchen omeprazole (PRILOSEC) 40 MG capsule Take 1 capsule (40 mg total) by mouth 2 (two) times daily. 60 capsule 5  . oxyCODONE-acetaminophen (PERCOCET) 10-325 MG per tablet Take 1-2 tablets by mouth every 6 (six) hours as needed for pain. 30 tablet 0  . tadalafil (CIALIS) 20 MG tablet Take 20 mg by mouth daily as needed for erectile dysfunction.    Marland Kitchen aspirin EC 81 MG EC tablet Take 1 tablet (81 mg total) by mouth daily. (Patient not taking: Reported on 01/17/2016)    . ibuprofen (ADVIL,MOTRIN) 200 MG tablet Take 200 mg by mouth every 6 (six) hours as needed. Reported on 03/18/2016    . [DISCONTINUED] simethicone (MYLICON) 80 MG chewable tablet Chew 80 mg by mouth every 6 (six) hours as needed for  flatulence.     No current facility-administered medications on file prior to visit.    Allergies  Allergen Reactions  . Chantix [Varenicline] Palpitations    Heart race Increased panic attacks  . Morphine Itching  . Penicillins Rash    Family History  Problem Relation Age of Onset  . Cancer Father 20    colon ca - deceased.  . Colon cancer Father   . Cancer Sister 31    breast ca  . Thyroid disease Neg Hx     BP 138/94 mmHg  Pulse 76  Temp(Src) 98 F (36.7 C) (Oral)  Ht 5\' 11"  (1.803 m)  Wt 193 lb (87.544 kg)  BMI 26.93 kg/m2  SpO2 98%  Review of Systems Denies fever.  He has lost a few lbs.    Objective:   Physical Exam VITAL SIGNS:  See vs page GENERAL: no distress NECK: a healed scar is present (C-spine procedure).  i do not appreciate the thyroid nodule.   Skin: not diaphoretic.   Lab Results  Component Value Date   TSH 1.34 03/18/2016      Assessment & Plan:  Hyperthyroidism: well-controlled.  Patient is advised the following: Patient Instructions  blood tests are requested for you today.  We'll let you know about the results. Please see an ear-nose-throat specialist.  you will receive a phone call, about a day and time for an appointment if ever you have fever while taking methimazole, stop it and call us, even if the reason is obvious, because of the risk of a rare side-effect. Please come back for a follow-up appointment in 4 months.  We can reconsider the radioactive iodine pill in then future if you wish.     addendum: Please continue the same medication.

## 2016-03-19 ENCOUNTER — Telehealth: Payer: Self-pay | Admitting: Endocrinology

## 2016-03-19 DIAGNOSIS — E7800 Pure hypercholesterolemia, unspecified: Secondary | ICD-10-CM | POA: Diagnosis not present

## 2016-03-19 NOTE — Telephone Encounter (Signed)
I contacted the pt and advised his thyroid levels are normal and as predicted his WBC was high. Pt advised to call back if he had any questions.

## 2016-03-19 NOTE — Telephone Encounter (Signed)
Patient calling for the results of his labwork

## 2016-03-26 DIAGNOSIS — R490 Dysphonia: Secondary | ICD-10-CM | POA: Diagnosis not present

## 2016-06-04 DIAGNOSIS — Z6827 Body mass index (BMI) 27.0-27.9, adult: Secondary | ICD-10-CM | POA: Diagnosis not present

## 2016-06-04 DIAGNOSIS — M791 Myalgia: Secondary | ICD-10-CM | POA: Diagnosis not present

## 2016-06-04 DIAGNOSIS — M5416 Radiculopathy, lumbar region: Secondary | ICD-10-CM | POA: Diagnosis not present

## 2016-06-04 DIAGNOSIS — I10 Essential (primary) hypertension: Secondary | ICD-10-CM | POA: Diagnosis not present

## 2016-06-04 DIAGNOSIS — M4722 Other spondylosis with radiculopathy, cervical region: Secondary | ICD-10-CM | POA: Diagnosis not present

## 2016-06-04 DIAGNOSIS — G4489 Other headache syndrome: Secondary | ICD-10-CM | POA: Diagnosis not present

## 2016-06-04 DIAGNOSIS — M5440 Lumbago with sciatica, unspecified side: Secondary | ICD-10-CM | POA: Diagnosis not present

## 2016-06-19 DIAGNOSIS — E7800 Pure hypercholesterolemia, unspecified: Secondary | ICD-10-CM | POA: Diagnosis not present

## 2016-06-19 DIAGNOSIS — R7301 Impaired fasting glucose: Secondary | ICD-10-CM | POA: Diagnosis not present

## 2016-06-19 DIAGNOSIS — I10 Essential (primary) hypertension: Secondary | ICD-10-CM | POA: Diagnosis not present

## 2016-06-19 DIAGNOSIS — E0590 Thyrotoxicosis, unspecified without thyrotoxic crisis or storm: Secondary | ICD-10-CM | POA: Diagnosis not present

## 2016-06-19 DIAGNOSIS — F411 Generalized anxiety disorder: Secondary | ICD-10-CM | POA: Diagnosis not present

## 2016-06-19 DIAGNOSIS — F17210 Nicotine dependence, cigarettes, uncomplicated: Secondary | ICD-10-CM | POA: Diagnosis not present

## 2016-06-19 DIAGNOSIS — R079 Chest pain, unspecified: Secondary | ICD-10-CM | POA: Diagnosis not present

## 2016-06-19 DIAGNOSIS — Z79899 Other long term (current) drug therapy: Secondary | ICD-10-CM | POA: Diagnosis not present

## 2016-06-25 ENCOUNTER — Encounter: Payer: Self-pay | Admitting: Endocrinology

## 2016-06-25 ENCOUNTER — Ambulatory Visit (INDEPENDENT_AMBULATORY_CARE_PROVIDER_SITE_OTHER): Payer: Medicare Other | Admitting: Endocrinology

## 2016-06-25 VITALS — BP 110/70 | HR 80 | Wt 195.0 lb

## 2016-06-25 DIAGNOSIS — E0590 Thyrotoxicosis, unspecified without thyrotoxic crisis or storm: Secondary | ICD-10-CM

## 2016-06-25 NOTE — Patient Instructions (Addendum)
I have removed the thyroid pill from your list.   Please come back for a follow-up appointment in 2-3 months.  We can reconsider the radioactive iodine pill in then future if you wish.

## 2016-06-25 NOTE — Progress Notes (Signed)
Subjective:    Patient ID: [name] [name], male    DOB: October 16, 1961, 55 y.o.   MRN: [id number]  HPI Pt returns for f/u of mild hyperthyroidism (due to multinodular goiter; dx'ed 2014; he declines RAI).  He takes tapazole as rx'ed.  Main symptom is generalized body pain, which he attributes to tapazole.   Past Medical History:  Diagnosis Date  . Anemia   . Anxiety   . Arthritis    neck, shoulder, left knee  . 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 Erythrocytosis 11/30/2014  . Essential hypertension    under control with med., has been on med. x 1 yr.  Marland Kitchen GERD (gastroesophageal reflux disease)   . Headache(784.0)    2 x/week  . Leukocytosis, unspecified 11/03/2013  . Limited joint range of motion    cervical fusion 02/2013  . Medial meniscus tear 06/2013   left knee  . Thyroid nodule   . Tobacco abuse   . Wears partial dentures    upper    Past Surgical History:  Procedure Laterality Date  . ANTERIOR CERVICAL DECOMP/DISCECTOMY FUSION N/A 03/18/2013   Procedure: ANTERIOR CERVICAL DECOMPRESSION/DISCECTOMY FUSION 3 LEVELS;  Surgeon: Erline Levine, MD;  Location: [facility];  Service: Neurosurgery;  Laterality: N/A;  Anterior Cervical Three-Six  Decompression/Diskectomy/Fusion  . APPENDECTOMY    . CIRCUMCISION  05/04/2006  . COLONOSCOPY    . EYE SURGERY Left    cataract  . HEMICOLECTOMY Right 123456   with ileocolic anastomosis  . INGUINAL HERNIA REPAIR    . KNEE ARTHROSCOPY Left 08/01/2013   Procedure: LEFT ARTHROSCOPY KNEE;  Surgeon: Kerin Salen, MD;  Location: [facility];  Service: Orthopedics;  Laterality: Left;  . SHOULDER ARTHROSCOPY W/ ROTATOR CUFF REPAIR Left 07/16/2010  . SMALL INTESTINE SURGERY  04/24/2000   adhesiolysis, ileocolic anastomosis  . UPPER GASTROINTESTINAL ENDOSCOPY      Social History   Social History  . Marital status: Married    Spouse name: N/A  . Number of children: N/A  . Years of education: N/A   Occupational History  . Not  on file.   Social History Main Topics  . Smoking status: Current Every Day Smoker    Packs/day: 1.00    Years: 25.00    Types: Cigarettes  . Smokeless tobacco: Never Used  . Alcohol use Yes     Comment: occasionally  . Drug use: No  . Sexual activity: Not on file   Other Topics Concern  . Not on file   Social History Narrative   Lives in [place] with his wife.    Current Outpatient Prescriptions on File Prior to Visit  Medication Sig Dispense Refill  . acetaminophen (TYLENOL) 500 MG tablet Take 500 mg by mouth every 6 (six) hours as needed.    Marland Kitchen atenolol-chlorthalidone (TENORETIC) 50-25 MG per tablet Take 1 tablet by mouth daily.    Marland Kitchen atorvastatin (LIPITOR) 10 MG tablet Take 10 mg by mouth daily.    . baclofen (LIORESAL) 10 MG tablet Take 10 mg by mouth 2 (two) times daily as needed for muscle spasms.    Marland Kitchen gabapentin (NEURONTIN) 100 MG capsule Take 100 mg by mouth 2 (two) times daily as needed (nerve pain).     . methocarbamol (ROBAXIN) 500 MG tablet Take 1 tablet (500 mg total) by mouth 2 (two) times daily. 20 tablet 0  . Multiple Vitamin (MULTIVITAMIN WITH MINERALS) TABS Take 1 tablet by mouth daily.    Marland Kitchen  omeprazole (PRILOSEC) 40 MG capsule Take 1 capsule (40 mg total) by mouth 2 (two) times daily. 60 capsule 5  . oxyCODONE-acetaminophen (PERCOCET) 10-325 MG per tablet Take 1-2 tablets by mouth every 6 (six) hours as needed for pain. 30 tablet 0  . tadalafil (CIALIS) 20 MG tablet Take 20 mg by mouth daily as needed for erectile dysfunction.    Marland Kitchen aspirin EC 81 MG EC tablet Take 1 tablet (81 mg total) by mouth daily. (Patient not taking: Reported on 01/17/2016)    . ibuprofen (ADVIL,MOTRIN) 200 MG tablet Take 200 mg by mouth every 6 (six) hours as needed. Reported on 03/18/2016    . [DISCONTINUED] simethicone (MYLICON) 80 MG chewable tablet Chew 80 mg by mouth every 6 (six) hours as needed for flatulence.     No current facility-administered medications on file prior to visit.      Allergies  Allergen Reactions  . Chantix [Varenicline] Palpitations    Heart race Increased panic attacks  . Morphine Itching  . Penicillins Rash    Family History  Problem Relation Age of Onset  . Cancer Father 2    colon ca - deceased.  . Colon cancer Father   . Cancer Sister 19    breast ca  . Thyroid disease Neg Hx     BP 110/70   Pulse 80   Wt 195 lb (88.5 kg)   SpO2 98%   BMI 27.20 kg/m   Review of Systems Denies fever.     Objective:   Physical Exam VITAL SIGNS:  See vs page.  GENERAL: no distress.   eyes: slight bilat periorbital swelling and bilat proptosis   NECK: a healed scar is present (C-spine procedure).  i do not appreciate the thyroid nodule.   Skin: not diaphoretic.     (our office has requested last week's TSH result, which pt says was normal)    Assessment & Plan:  Hyperthyroidism, apparently well-controlled Myalgias, perceived due to tapazole

## 2016-07-18 ENCOUNTER — Ambulatory Visit: Payer: Medicare Other | Admitting: Endocrinology

## 2016-07-28 DIAGNOSIS — E0590 Thyrotoxicosis, unspecified without thyrotoxic crisis or storm: Secondary | ICD-10-CM | POA: Diagnosis not present

## 2016-07-28 DIAGNOSIS — I10 Essential (primary) hypertension: Secondary | ICD-10-CM | POA: Diagnosis not present

## 2016-07-28 DIAGNOSIS — E041 Nontoxic single thyroid nodule: Secondary | ICD-10-CM | POA: Diagnosis not present

## 2016-07-28 DIAGNOSIS — J069 Acute upper respiratory infection, unspecified: Secondary | ICD-10-CM | POA: Diagnosis not present

## 2016-07-28 DIAGNOSIS — Z23 Encounter for immunization: Secondary | ICD-10-CM | POA: Diagnosis not present

## 2016-07-28 DIAGNOSIS — F411 Generalized anxiety disorder: Secondary | ICD-10-CM | POA: Diagnosis not present

## 2016-07-28 DIAGNOSIS — E876 Hypokalemia: Secondary | ICD-10-CM | POA: Diagnosis not present

## 2016-07-28 DIAGNOSIS — Z1159 Encounter for screening for other viral diseases: Secondary | ICD-10-CM | POA: Diagnosis not present

## 2016-08-11 DIAGNOSIS — E876 Hypokalemia: Secondary | ICD-10-CM | POA: Diagnosis not present

## 2016-08-25 ENCOUNTER — Ambulatory Visit: Payer: Medicare Other | Admitting: Endocrinology

## 2016-09-01 DIAGNOSIS — M5440 Lumbago with sciatica, unspecified side: Secondary | ICD-10-CM | POA: Diagnosis not present

## 2016-09-01 DIAGNOSIS — M4722 Other spondylosis with radiculopathy, cervical region: Secondary | ICD-10-CM | POA: Diagnosis not present

## 2016-09-01 DIAGNOSIS — R51 Headache: Secondary | ICD-10-CM | POA: Diagnosis not present

## 2016-09-01 DIAGNOSIS — E876 Hypokalemia: Secondary | ICD-10-CM | POA: Diagnosis not present

## 2016-09-01 DIAGNOSIS — M5416 Radiculopathy, lumbar region: Secondary | ICD-10-CM | POA: Diagnosis not present

## 2016-09-01 DIAGNOSIS — M791 Myalgia: Secondary | ICD-10-CM | POA: Diagnosis not present

## 2016-09-01 DIAGNOSIS — Z6829 Body mass index (BMI) 29.0-29.9, adult: Secondary | ICD-10-CM | POA: Diagnosis not present

## 2016-09-26 DIAGNOSIS — E876 Hypokalemia: Secondary | ICD-10-CM | POA: Diagnosis not present

## 2016-09-26 DIAGNOSIS — I10 Essential (primary) hypertension: Secondary | ICD-10-CM | POA: Diagnosis not present

## 2016-09-26 DIAGNOSIS — F17210 Nicotine dependence, cigarettes, uncomplicated: Secondary | ICD-10-CM | POA: Diagnosis not present

## 2016-12-01 DIAGNOSIS — M5440 Lumbago with sciatica, unspecified side: Secondary | ICD-10-CM | POA: Diagnosis not present

## 2016-12-01 DIAGNOSIS — M4722 Other spondylosis with radiculopathy, cervical region: Secondary | ICD-10-CM | POA: Diagnosis not present

## 2016-12-01 DIAGNOSIS — I10 Essential (primary) hypertension: Secondary | ICD-10-CM | POA: Diagnosis not present

## 2016-12-01 DIAGNOSIS — M5416 Radiculopathy, lumbar region: Secondary | ICD-10-CM | POA: Diagnosis not present

## 2016-12-01 DIAGNOSIS — Z6828 Body mass index (BMI) 28.0-28.9, adult: Secondary | ICD-10-CM | POA: Diagnosis not present

## 2016-12-11 DIAGNOSIS — R042 Hemoptysis: Secondary | ICD-10-CM | POA: Diagnosis not present

## 2016-12-11 DIAGNOSIS — R109 Unspecified abdominal pain: Secondary | ICD-10-CM | POA: Diagnosis not present

## 2016-12-11 DIAGNOSIS — Z720 Tobacco use: Secondary | ICD-10-CM | POA: Diagnosis not present

## 2016-12-12 ENCOUNTER — Other Ambulatory Visit: Payer: Self-pay | Admitting: Family Medicine

## 2016-12-12 DIAGNOSIS — R042 Hemoptysis: Secondary | ICD-10-CM

## 2016-12-16 ENCOUNTER — Ambulatory Visit
Admission: RE | Admit: 2016-12-16 | Discharge: 2016-12-16 | Disposition: A | Discharge status: Home / Self Care | Payer: Medicare Other | Source: Ambulatory Visit | Attending: Family Medicine | Admitting: Family Medicine

## 2016-12-16 DIAGNOSIS — R079 Chest pain, unspecified: Secondary | ICD-10-CM | POA: Diagnosis not present

## 2016-12-16 DIAGNOSIS — R042 Hemoptysis: Secondary | ICD-10-CM

## 2017-02-16 DIAGNOSIS — I10 Essential (primary) hypertension: Secondary | ICD-10-CM | POA: Diagnosis not present

## 2017-02-16 DIAGNOSIS — D3501 Benign neoplasm of right adrenal gland: Secondary | ICD-10-CM | POA: Diagnosis not present

## 2017-02-16 DIAGNOSIS — Z0001 Encounter for general adult medical examination with abnormal findings: Secondary | ICD-10-CM | POA: Diagnosis not present

## 2017-02-16 DIAGNOSIS — Z125 Encounter for screening for malignant neoplasm of prostate: Secondary | ICD-10-CM | POA: Diagnosis not present

## 2017-02-16 DIAGNOSIS — N5201 Erectile dysfunction due to arterial insufficiency: Secondary | ICD-10-CM | POA: Diagnosis not present

## 2017-02-16 DIAGNOSIS — F411 Generalized anxiety disorder: Secondary | ICD-10-CM | POA: Diagnosis not present

## 2017-02-16 DIAGNOSIS — F17210 Nicotine dependence, cigarettes, uncomplicated: Secondary | ICD-10-CM | POA: Diagnosis not present

## 2017-02-16 DIAGNOSIS — Z79899 Other long term (current) drug therapy: Secondary | ICD-10-CM | POA: Diagnosis not present

## 2017-02-16 DIAGNOSIS — E7800 Pure hypercholesterolemia, unspecified: Secondary | ICD-10-CM | POA: Diagnosis not present

## 2017-02-16 DIAGNOSIS — E041 Nontoxic single thyroid nodule: Secondary | ICD-10-CM | POA: Diagnosis not present

## 2017-02-24 DIAGNOSIS — M5416 Radiculopathy, lumbar region: Secondary | ICD-10-CM | POA: Diagnosis not present

## 2017-02-24 DIAGNOSIS — M25551 Pain in right hip: Secondary | ICD-10-CM | POA: Diagnosis not present

## 2017-02-24 DIAGNOSIS — I10 Essential (primary) hypertension: Secondary | ICD-10-CM | POA: Diagnosis not present

## 2017-02-24 DIAGNOSIS — M791 Myalgia: Secondary | ICD-10-CM | POA: Diagnosis not present

## 2017-02-24 DIAGNOSIS — Z6828 Body mass index (BMI) 28.0-28.9, adult: Secondary | ICD-10-CM | POA: Diagnosis not present

## 2017-02-24 DIAGNOSIS — M25511 Pain in right shoulder: Secondary | ICD-10-CM | POA: Diagnosis not present

## 2017-02-24 DIAGNOSIS — M4722 Other spondylosis with radiculopathy, cervical region: Secondary | ICD-10-CM | POA: Diagnosis not present

## 2017-03-03 ENCOUNTER — Ambulatory Visit: Payer: Medicare Other | Admitting: Endocrinology

## 2017-03-13 ENCOUNTER — Encounter: Payer: Self-pay | Admitting: Endocrinology

## 2017-03-13 ENCOUNTER — Ambulatory Visit (INDEPENDENT_AMBULATORY_CARE_PROVIDER_SITE_OTHER): Payer: Medicare Other | Admitting: Endocrinology

## 2017-03-13 VITALS — BP 140/78 | HR 78 | Wt 199.0 lb

## 2017-03-13 DIAGNOSIS — E279 Disorder of adrenal gland, unspecified: Secondary | ICD-10-CM | POA: Diagnosis not present

## 2017-03-13 DIAGNOSIS — E0590 Thyrotoxicosis, unspecified without thyrotoxic crisis or storm: Secondary | ICD-10-CM

## 2017-03-13 DIAGNOSIS — E278 Other specified disorders of adrenal gland: Secondary | ICD-10-CM | POA: Insufficient documentation

## 2017-03-13 MED ORDER — DEXAMETHASONE 1 MG PO TABS: 1.0000 mg | ORAL_TABLET | Freq: Two times a day (BID) | ORAL | 0 refills | Status: DC

## 2017-03-13 NOTE — Progress Notes (Signed)
Subjective:    Patient ID: [name] [name], male    DOB: 08/26/1961, 56 y.o.   MRN: [id number]  HPI Pt returns for f/u of mild hyperthyroidism (due to multinodular goiter; dx'ed 2014; he declines RAI; he stopped tapazole, due to myalgias).  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   He has slight pain at the right flank, but no assoc flushing.   Past Medical History:  Diagnosis Date  . Anemia   . Anxiety   . Arthritis    neck, shoulder, left knee  . 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 Erythrocytosis 11/30/2014  . Essential hypertension    under control with med., has been on med. x 1 yr.  Marland Kitchen GERD (gastroesophageal reflux disease)   . Headache(784.0)    2 x/week  . Leukocytosis, unspecified 11/03/2013  . Limited joint range of motion    cervical fusion 02/2013  . Medial meniscus tear 06/2013   left knee  . Thyroid nodule   . Tobacco abuse   . Wears partial dentures    upper    Past Surgical History:  Procedure Laterality Date  . ANTERIOR CERVICAL DECOMP/DISCECTOMY FUSION N/A 03/18/2013   Procedure: ANTERIOR CERVICAL DECOMPRESSION/DISCECTOMY FUSION 3 LEVELS;  Surgeon: Erline Levine, MD;  Location: [facility];  Service: Neurosurgery;  Laterality: N/A;  Anterior Cervical Three-Six  Decompression/Diskectomy/Fusion  . APPENDECTOMY    . CIRCUMCISION  05/04/2006  . COLONOSCOPY    . EYE SURGERY Left    cataract  . HEMICOLECTOMY Right 1/63/8453   with ileocolic anastomosis  . INGUINAL HERNIA REPAIR    . KNEE ARTHROSCOPY Left 08/01/2013   Procedure: LEFT ARTHROSCOPY KNEE;  Surgeon: Kerin Salen, MD;  Location: [facility];  Service: Orthopedics;  Laterality: Left;  . SHOULDER ARTHROSCOPY W/ ROTATOR CUFF REPAIR Left 07/16/2010  . SMALL INTESTINE SURGERY  04/24/2000   adhesiolysis, ileocolic anastomosis  . UPPER GASTROINTESTINAL ENDOSCOPY      Social History   Social History  . Marital status: Married    Spouse name: N/A  . Number of children: N/A  .  Years of education: N/A   Occupational History  . Not on file.   Social History Main Topics  . Smoking status: Current Every Day Smoker    Packs/day: 1.00    Years: 25.00    Types: Cigarettes  . Smokeless tobacco: Never Used  . Alcohol use Yes     Comment: occasionally  . Drug use: No  . Sexual activity: Not on file   Other Topics Concern  . Not on file   Social History Narrative   Lives in [place] with his wife.    Current Outpatient Prescriptions on File Prior to Visit  Medication Sig Dispense Refill  . acetaminophen (TYLENOL) 500 MG tablet Take 500 mg by mouth every 6 (six) hours as needed.    Marland Kitchen atorvastatin (LIPITOR) 10 MG tablet Take 10 mg by mouth daily.    Marland Kitchen gabapentin (NEURONTIN) 100 MG capsule Take 100 mg by mouth 2 (two) times daily as needed (nerve pain).     Marland Kitchen ibuprofen (ADVIL,MOTRIN) 200 MG tablet Take 200 mg by mouth every 6 (six) hours as needed. Reported on 03/18/2016    . methocarbamol (ROBAXIN) 500 MG tablet Take 1 tablet (500 mg total) by mouth 2 (two) times daily. 20 tablet 0  . Multiple Vitamin (MULTIVITAMIN WITH MINERALS) TABS Take 1 tablet by mouth daily.    Marland Kitchen  omeprazole (PRILOSEC) 40 MG capsule Take 1 capsule (40 mg total) by mouth 2 (two) times daily. 60 capsule 5  . oxyCODONE-acetaminophen (PERCOCET) 10-325 MG per tablet Take 1-2 tablets by mouth every 6 (six) hours as needed for pain. 30 tablet 0  . tadalafil (CIALIS) 20 MG tablet Take 20 mg by mouth daily as needed for erectile dysfunction.    . [DISCONTINUED] simethicone (MYLICON) 80 MG chewable tablet Chew 80 mg by mouth every 6 (six) hours as needed for flatulence.     No current facility-administered medications on file prior to visit.     Allergies  Allergen Reactions  . Chantix [Varenicline] Palpitations    Heart race Increased panic attacks  . Morphine Itching  . Penicillins Rash    Family History  Problem Relation Age of Onset  . Cancer Father 73       colon ca - deceased.  . Colon  cancer Father   . Cancer Sister 21       breast ca  . Thyroid disease Neg Hx     BP 140/78 (BP Location: Left Arm, Patient Position: Sitting)   Pulse 78   Wt 199 lb (90.3 kg)   SpO2 98%   BMI 27.75 kg/m    Review of Systems Denies weigfht change and excessive diaphoresis.     Objective:   Physical Exam VITAL SIGNS:  See vs page.  GENERAL: no distress.   NECK: a healed scar is present (C-spine procedure).  i do not appreciate the thyroid nodule.   Skin: not diaphoretic.   CT: Right adrenal nodule shows gradual enlargement 05/01/2009 and may represent adenoma.      Assessment & Plan:  Adrenal nodule, new to me, needs w/u.  However, even with neg endocrine w/u, he should consider resection, due to interval growth. Hyperthyroidism, due for recheck.   Patient Instructions  You should do a "dexamethasone suppression test."  For this, you would take dexamethasone 1 mg at 10 pm (I have sent a prescription to your pharmacy), then come in for a "cortisol" blood test the next morning before 9 am.  You do not need to be fasting for this test. Also, please check a 24-hr urine test. Please see a surgery specialist.  you will receive a phone call, about a day and time for an appointment. Please come back for a follow-up appointment in 6 months.

## 2017-03-13 NOTE — Patient Instructions (Signed)
You should do a "dexamethasone suppression test."  For this, you would take dexamethasone 1 mg at 10 pm (I have sent a prescription to your pharmacy), then come in for a "cortisol" blood test the next morning before 9 am.  You do not need to be fasting for this test. Also, please check a 24-hr urine test. Please see a surgery specialist.  you will receive a phone call, about a day and time for an appointment. Please come back for a follow-up appointment in 6 months.

## 2017-03-16 ENCOUNTER — Other Ambulatory Visit: Payer: Self-pay

## 2017-03-16 ENCOUNTER — Telehealth: Payer: Self-pay | Admitting: Endocrinology

## 2017-03-16 NOTE — Telephone Encounter (Signed)
I contacted the pharmacy and advised the decadron prescription is for 1 tab for 1 day due to a suppression test the patient is having on 03/17/2017. Patient notified of this as well.

## 2017-03-16 NOTE — Telephone Encounter (Signed)
dexamethasone (DECADRON) 1 MG tablet  [place], [place] [address] [phone] (Phone) [phone] (Fax)   Patient needs a refill on rx above and needs the nurse to call the pharmacy to advise on the dosage. Patient requests a call back once everything has been sorted out. Okay to leave a message on phone.

## 2017-03-16 NOTE — Telephone Encounter (Signed)
See message. Dexadron was submitted for a refill on on 03/13/2017, but the quantity was for 1 tablet. Is it ok to send for a 30 day supply with 60 tablets? Thanks!

## 2017-03-16 NOTE — Telephone Encounter (Signed)
No, this was just for the test.  It is not an ongoing medication

## 2017-03-16 NOTE — Telephone Encounter (Signed)
Patient stated pharmacy  Stated they did not receive the medication Dexadron, he need to take one tab tonight at 10:00. Please advise   [facility] [address] [place] - [address] [phone] (Phone) [phone] (Fax)

## 2017-03-17 ENCOUNTER — Other Ambulatory Visit: Payer: Medicare Other

## 2017-03-17 DIAGNOSIS — E279 Disorder of adrenal gland, unspecified: Secondary | ICD-10-CM | POA: Diagnosis not present

## 2017-03-17 LAB — TSH: TSH: 0.43 u[IU]/mL (ref 0.35–4.50)

## 2017-03-17 LAB — CORTISOL: CORTISOL PLASMA: 0.9 ug/dL

## 2017-03-17 NOTE — Addendum Note (Signed)
Addended by: Kaylyn Lim I on: 03/17/2017 08:40 AM   Modules accepted: Orders

## 2017-03-17 NOTE — Telephone Encounter (Signed)
Patient is calling on the status of his surgery, no one has called him yet. Please advise

## 2017-03-17 NOTE — Telephone Encounter (Signed)
[name] [name] can you check on the status of this and contact the patient.

## 2017-03-18 DIAGNOSIS — M25551 Pain in right hip: Secondary | ICD-10-CM | POA: Diagnosis not present

## 2017-03-18 DIAGNOSIS — M25511 Pain in right shoulder: Secondary | ICD-10-CM | POA: Diagnosis not present

## 2017-03-21 LAB — ALDOSTERONE + RENIN ACTIVITY W/ RATIO
Aldosterone: <1 ng/dL
PRA LC/MS/MS: 1.08 ng/mL/h (ref 0.25–5.82)

## 2017-03-24 LAB — METANEPHRINES, URINE, 24 HOUR
METANEPH TOTAL UR: 2026 ug/(24.h) — AB (ref 224–832)
METANEPHRINES UR: 1301 ug/(24.h) — AB (ref 90–315)
NORMETANEPHRINE 24H UR: 725 ug/(24.h) — AB (ref 122–676)

## 2017-03-27 LAB — CATECHOLAMINES, FRACTIONATED, URINE, 24 HOUR
CALCULATED TOTAL (E+ NE): 223 ug/(24.h) — AB (ref 26–121)
Creatinine, Urine mg/day-CATEUR: 1.8 g/(24.h) (ref 0.63–2.50)
Dopamine, 24 hr Urine: 257 mcg/24 h (ref 52–480)
Epinephrine, 24 hr Urine: 96 mcg/24 h — ABNORMAL HIGH (ref 2–24)
Norepinephrine, 24 hr Ur: 127 mcg/24 h — ABNORMAL HIGH (ref 15–100)
TOTAL VOLUME - CF 24HR U: 900 mL

## 2017-03-28 ENCOUNTER — Other Ambulatory Visit: Payer: Self-pay | Admitting: Endocrinology

## 2017-03-28 MED ORDER — LABETALOL HCL 100 MG PO TABS: 50.0000 mg | ORAL_TABLET | Freq: Two times a day (BID) | ORAL | 5 refills | Status: DC

## 2017-04-03 ENCOUNTER — Telehealth: Payer: Self-pay | Admitting: Family Medicine

## 2017-04-03 NOTE — Telephone Encounter (Signed)
This is to control your heart rate and blood pressure, while you are getting your appointment with the surgery specialist.

## 2017-04-03 NOTE — Telephone Encounter (Signed)
Please advise. Thank you

## 2017-04-03 NOTE — Telephone Encounter (Signed)
Wants to know why he was prescribed labetalol (NORMODYNE) 100 MG tablet?  Please call to advise,  -LL

## 2017-04-06 NOTE — Telephone Encounter (Signed)
This message was not reviewed by me until 04/06/2017. Patient notified of message and voiced understanding.

## 2017-04-08 ENCOUNTER — Ambulatory Visit: Payer: Self-pay | Admitting: General Surgery

## 2017-04-08 DIAGNOSIS — D3501 Benign neoplasm of right adrenal gland: Secondary | ICD-10-CM | POA: Diagnosis not present

## 2017-04-22 ENCOUNTER — Encounter (HOSPITAL_COMMUNITY): Payer: Self-pay | Admitting: *Deleted

## 2017-04-22 NOTE — Patient Instructions (Addendum)
[name] [name]  04/22/2017   Your procedure is scheduled on: 05/06/2017    Report to [facility] Main  Entrance Take [place]  elevators to 3rd floor to  [place] at   0900 AM.    Call this number if you have problems the morning of surgery [phone]    Remember: ONLY 1 PERSON MAY GO WITH YOU TO SHORT STAY TO GET  READY MORNING OF YOUR SURGERY.  Do not eat food or drink liquids :After Midnight.     Take these medicines the morning of surgery with A SIP OF WATER:  Omeprazole ( PRilosec), Clonidine ( Catapres)                                You may not have any metal on your body including hair pins and              piercings  Do not wear jewelry, , lotions, powders or perfumes, deodorant                           Men may shave face and neck.   Do not bring valuables to the hospital. [place].  Contacts, dentures or bridgework may not be worn into surgery.  Leave suitcase in the car. After surgery it may be brought to your room.                   Please read over the following fact sheets you were given: _____________________________________________________________________             [facility] - Preparing for Surgery Before surgery, you can play an important role.  Because skin is not sterile, your skin needs to be as free of germs as possible.  You can reduce the number of germs on your skin by washing with CHG (chlorahexidine gluconate) soap before surgery.  CHG is an antiseptic cleaner which kills germs and bonds with the skin to continue killing germs even after washing. Please DO NOT use if you have an allergy to CHG or antibacterial soaps.  If your skin becomes reddened/irritated stop using the CHG and inform your nurse when you arrive at Short Stay. Do not shave (including legs and underarms) for at least 48 hours prior to the first CHG shower.  You may shave your face/neck. Please follow  these instructions carefully:  1.  Shower with CHG Soap the night before surgery and the  morning of Surgery.  2.  If you choose to wash your hair, wash your hair first as usual with your  normal  shampoo.  3.  After you shampoo, rinse your hair and body thoroughly to remove the  shampoo.                           4.  Use CHG as you would any other liquid soap.  You can apply chg directly  to the skin and wash                       Gently with a scrungie or clean washcloth.  5.  Apply the CHG Soap  to your body ONLY FROM THE NECK DOWN.   Do not use on face/ open                           Wound or open sores. Avoid contact with eyes, ears mouth and genitals (private parts).                       Wash face,  Genitals (private parts) with your normal soap.             6.  Wash thoroughly, paying special attention to the area where your surgery  will be performed.  7.  Thoroughly rinse your body with warm water from the neck down.  8.  DO NOT shower/wash with your normal soap after using and rinsing off  the CHG Soap.                9.  Pat yourself dry with a clean towel.            10.  Wear clean pajamas.            11.  Place clean sheets on your bed the night of your first shower and do not  sleep with pets. Day of Surgery : Do not apply any lotions/deodorants the morning of surgery.  Please wear clean clothes to the hospital/surgery center.  FAILURE TO FOLLOW THESE INSTRUCTIONS MAY RESULT IN THE CANCELLATION OF YOUR SURGERY PATIENT SIGNATURE_________________________________  NURSE SIGNATURE__________________________________  ________________________________________________________________________

## 2017-05-04 ENCOUNTER — Encounter (HOSPITAL_COMMUNITY): Payer: Self-pay

## 2017-05-04 ENCOUNTER — Encounter (HOSPITAL_COMMUNITY)
Admission: RE | Admit: 2017-05-04 | Discharge: 2017-05-04 | Disposition: A | Discharge status: Home / Self Care | Payer: Medicare Other | Source: Ambulatory Visit | Attending: General Surgery | Admitting: General Surgery

## 2017-05-04 DIAGNOSIS — B370 Candidal stomatitis: Secondary | ICD-10-CM | POA: Diagnosis not present

## 2017-05-04 DIAGNOSIS — G9341 Metabolic encephalopathy: Secondary | ICD-10-CM | POA: Diagnosis not present

## 2017-05-04 DIAGNOSIS — D62 Acute posthemorrhagic anemia: Secondary | ICD-10-CM | POA: Diagnosis not present

## 2017-05-04 DIAGNOSIS — J690 Pneumonitis due to inhalation of food and vomit: Secondary | ICD-10-CM | POA: Diagnosis not present

## 2017-05-04 DIAGNOSIS — E43 Unspecified severe protein-calorie malnutrition: Secondary | ICD-10-CM | POA: Diagnosis not present

## 2017-05-04 DIAGNOSIS — R6520 Severe sepsis without septic shock: Secondary | ICD-10-CM | POA: Diagnosis not present

## 2017-05-04 DIAGNOSIS — T8132XA Disruption of internal operation (surgical) wound, not elsewhere classified, initial encounter: Secondary | ICD-10-CM | POA: Diagnosis not present

## 2017-05-04 DIAGNOSIS — D3500 Benign neoplasm of unspecified adrenal gland: Secondary | ICD-10-CM | POA: Insufficient documentation

## 2017-05-04 DIAGNOSIS — Z01812 Encounter for preprocedural laboratory examination: Secondary | ICD-10-CM

## 2017-05-04 DIAGNOSIS — Z01810 Encounter for preprocedural cardiovascular examination: Secondary | ICD-10-CM

## 2017-05-04 DIAGNOSIS — D3501 Benign neoplasm of right adrenal gland: Secondary | ICD-10-CM | POA: Diagnosis not present

## 2017-05-04 DIAGNOSIS — J9601 Acute respiratory failure with hypoxia: Secondary | ICD-10-CM | POA: Diagnosis not present

## 2017-05-04 DIAGNOSIS — K560 Paralytic ileus: Secondary | ICD-10-CM | POA: Diagnosis not present

## 2017-05-04 DIAGNOSIS — K659 Peritonitis, unspecified: Secondary | ICD-10-CM | POA: Diagnosis not present

## 2017-05-04 DIAGNOSIS — A419 Sepsis, unspecified organism: Secondary | ICD-10-CM | POA: Diagnosis not present

## 2017-05-04 HISTORY — DX: Personal history of urinary calculi: Z87.442

## 2017-05-04 HISTORY — DX: Hypothyroidism, unspecified: E03.9

## 2017-05-04 LAB — CBC WITH DIFFERENTIAL/PLATELET
BASOS ABS: 0 10*3/uL (ref 0.0–0.1)
BASOS PCT: 0 %
EOS PCT: 1 %
Eosinophils Absolute: 0.2 10*3/uL (ref 0.0–0.7)
HEMATOCRIT: 40.1 % (ref 39.0–52.0)
Hemoglobin: 13.9 g/dL (ref 13.0–17.0)
Lymphocytes Relative: 29 %
Lymphs Abs: 3.9 10*3/uL (ref 0.7–4.0)
MCH: 27.9 pg (ref 26.0–34.0)
MCHC: 34.7 g/dL (ref 30.0–36.0)
MCV: 80.5 fL (ref 78.0–100.0)
MONO ABS: 0.8 10*3/uL (ref 0.1–1.0)
Monocytes Relative: 6 %
NEUTROS ABS: 8.6 10*3/uL — AB (ref 1.7–7.7)
Neutrophils Relative %: 64 %
PLATELETS: 358 10*3/uL (ref 150–400)
RBC: 4.98 MIL/uL (ref 4.22–5.81)
RDW: 15.3 % (ref 11.5–15.5)
WBC: 13.5 10*3/uL — AB (ref 4.0–10.5)

## 2017-05-04 LAB — COMPREHENSIVE METABOLIC PANEL
ALBUMIN: 4.1 g/dL (ref 3.5–5.0)
ALT: 32 U/L (ref 17–63)
AST: 31 U/L (ref 15–41)
Alkaline Phosphatase: 78 U/L (ref 38–126)
Anion gap: 12 (ref 5–15)
BUN: 11 mg/dL (ref 6–20)
CHLORIDE: 103 mmol/L (ref 101–111)
CO2: 26 mmol/L (ref 22–32)
Calcium: 9.6 mg/dL (ref 8.9–10.3)
Creatinine, Ser: 0.81 mg/dL (ref 0.61–1.24)
GFR calc Af Amer: >60 mL/min (ref >60)
GFR calc non Af Amer: >60 mL/min (ref >60)
GLUCOSE: 105 mg/dL — AB (ref 65–99)
POTASSIUM: 3.4 mmol/L — AB (ref 3.5–5.1)
Sodium: 141 mmol/L (ref 135–145)
Total Bilirubin: 0.5 mg/dL (ref 0.3–1.2)
Total Protein: 7.1 g/dL (ref 6.5–8.1)

## 2017-05-04 LAB — PROTIME-INR
INR: 1
PROTHROMBIN TIME: 13.2 s (ref 11.4–15.2)

## 2017-05-04 LAB — APTT: APTT: 36 s (ref 24–36)

## 2017-05-04 NOTE — Progress Notes (Signed)
CBc/Diff done 05/04/17 faxed via epic to Dr Kieth Brightly.

## 2017-05-04 NOTE — Progress Notes (Signed)
Stress Test - 2016 - epic

## 2017-05-04 NOTE — Progress Notes (Signed)
DR Marcell Barlow( anesthesia ) called to see patient at time of preop appointment.  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  Informed patient to call office and the office would have MD call and speak with him.  Dr Marcell Barlow made aware of all blood pressure medications patient is currently taking to include: Chlorthalidone, Valsartan and Clonidine.  DR Marcell Barlow also aware of 03/17/2017 labs done and values.  Anesthesia to see patient am of surgery per Dr Marcell Barlow.

## 2017-05-06 ENCOUNTER — Inpatient Hospital Stay (HOSPITAL_COMMUNITY)
Admission: RE | Admit: 2017-05-06 | Discharge: 2017-09-04 | DRG: 614 | Disposition: A | Discharge status: Home Health | Payer: Medicare Other | Source: Ambulatory Visit | Attending: Surgery | Admitting: Surgery

## 2017-05-06 ENCOUNTER — Encounter (HOSPITAL_COMMUNITY)
Admission: RE | Disposition: A | Discharge status: Home Health | Payer: Self-pay | Source: Ambulatory Visit | Attending: Surgery

## 2017-05-06 ENCOUNTER — Inpatient Hospital Stay (HOSPITAL_COMMUNITY): Payer: Medicare Other | Admitting: Certified Registered"

## 2017-05-06 ENCOUNTER — Encounter (HOSPITAL_COMMUNITY): Payer: Self-pay | Admitting: Anesthesiology

## 2017-05-06 DIAGNOSIS — D649 Anemia, unspecified: Secondary | ICD-10-CM | POA: Diagnosis not present

## 2017-05-06 DIAGNOSIS — J690 Pneumonitis due to inhalation of food and vomit: Secondary | ICD-10-CM | POA: Diagnosis not present

## 2017-05-06 DIAGNOSIS — R6520 Severe sepsis without septic shock: Secondary | ICD-10-CM | POA: Diagnosis not present

## 2017-05-06 DIAGNOSIS — T17500A Unspecified foreign body in bronchus causing asphyxiation, initial encounter: Secondary | ICD-10-CM

## 2017-05-06 DIAGNOSIS — E279 Disorder of adrenal gland, unspecified: Secondary | ICD-10-CM | POA: Diagnosis not present

## 2017-05-06 DIAGNOSIS — E876 Hypokalemia: Secondary | ICD-10-CM | POA: Diagnosis not present

## 2017-05-06 DIAGNOSIS — G8929 Other chronic pain: Secondary | ICD-10-CM | POA: Diagnosis not present

## 2017-05-06 DIAGNOSIS — M7989 Other specified soft tissue disorders: Secondary | ICD-10-CM | POA: Diagnosis not present

## 2017-05-06 DIAGNOSIS — T4275XA Adverse effect of unspecified antiepileptic and sedative-hypnotic drugs, initial encounter: Secondary | ICD-10-CM | POA: Diagnosis not present

## 2017-05-06 DIAGNOSIS — T402X5A Adverse effect of other opioids, initial encounter: Secondary | ICD-10-CM | POA: Diagnosis present

## 2017-05-06 DIAGNOSIS — Z452 Encounter for adjustment and management of vascular access device: Secondary | ICD-10-CM

## 2017-05-06 DIAGNOSIS — A419 Sepsis, unspecified organism: Secondary | ICD-10-CM

## 2017-05-06 DIAGNOSIS — R109 Unspecified abdominal pain: Secondary | ICD-10-CM | POA: Diagnosis not present

## 2017-05-06 DIAGNOSIS — Z720 Tobacco use: Secondary | ICD-10-CM | POA: Diagnosis present

## 2017-05-06 DIAGNOSIS — J9690 Respiratory failure, unspecified, unspecified whether with hypoxia or hypercapnia: Secondary | ICD-10-CM

## 2017-05-06 DIAGNOSIS — Z01812 Encounter for preprocedural laboratory examination: Secondary | ICD-10-CM

## 2017-05-06 DIAGNOSIS — F411 Generalized anxiety disorder: Secondary | ICD-10-CM | POA: Diagnosis not present

## 2017-05-06 DIAGNOSIS — E871 Hypo-osmolality and hyponatremia: Secondary | ICD-10-CM | POA: Diagnosis not present

## 2017-05-06 DIAGNOSIS — Z515 Encounter for palliative care: Secondary | ICD-10-CM | POA: Diagnosis not present

## 2017-05-06 DIAGNOSIS — B370 Candidal stomatitis: Secondary | ICD-10-CM | POA: Diagnosis not present

## 2017-05-06 DIAGNOSIS — D509 Iron deficiency anemia, unspecified: Secondary | ICD-10-CM | POA: Diagnosis not present

## 2017-05-06 DIAGNOSIS — B3781 Candidal esophagitis: Secondary | ICD-10-CM | POA: Diagnosis not present

## 2017-05-06 DIAGNOSIS — T8132XA Disruption of internal operation (surgical) wound, not elsewhere classified, initial encounter: Secondary | ICD-10-CM | POA: Diagnosis not present

## 2017-05-06 DIAGNOSIS — Z981 Arthrodesis status: Secondary | ICD-10-CM

## 2017-05-06 DIAGNOSIS — M79609 Pain in unspecified limb: Secondary | ICD-10-CM | POA: Diagnosis not present

## 2017-05-06 DIAGNOSIS — R188 Other ascites: Secondary | ICD-10-CM | POA: Diagnosis not present

## 2017-05-06 DIAGNOSIS — E278 Other specified disorders of adrenal gland: Secondary | ICD-10-CM | POA: Diagnosis present

## 2017-05-06 DIAGNOSIS — K560 Paralytic ileus: Secondary | ICD-10-CM | POA: Diagnosis not present

## 2017-05-06 DIAGNOSIS — F1190 Opioid use, unspecified, uncomplicated: Secondary | ICD-10-CM

## 2017-05-06 DIAGNOSIS — K565 Intestinal adhesions [bands], unspecified as to partial versus complete obstruction: Secondary | ICD-10-CM | POA: Diagnosis not present

## 2017-05-06 DIAGNOSIS — Z1611 Resistance to penicillins: Secondary | ICD-10-CM

## 2017-05-06 DIAGNOSIS — K631 Perforation of intestine (nontraumatic): Secondary | ICD-10-CM | POA: Diagnosis not present

## 2017-05-06 DIAGNOSIS — J9601 Acute respiratory failure with hypoxia: Secondary | ICD-10-CM | POA: Diagnosis not present

## 2017-05-06 DIAGNOSIS — J90 Pleural effusion, not elsewhere classified: Secondary | ICD-10-CM | POA: Diagnosis not present

## 2017-05-06 DIAGNOSIS — T8140XA Infection following a procedure, unspecified, initial encounter: Secondary | ICD-10-CM | POA: Diagnosis not present

## 2017-05-06 DIAGNOSIS — Y95 Nosocomial condition: Secondary | ICD-10-CM | POA: Diagnosis not present

## 2017-05-06 DIAGNOSIS — E039 Hypothyroidism, unspecified: Secondary | ICD-10-CM | POA: Diagnosis present

## 2017-05-06 DIAGNOSIS — Z79899 Other long term (current) drug therapy: Secondary | ICD-10-CM

## 2017-05-06 DIAGNOSIS — D72829 Elevated white blood cell count, unspecified: Secondary | ICD-10-CM

## 2017-05-06 DIAGNOSIS — K9172 Accidental puncture and laceration of a digestive system organ or structure during other procedure: Secondary | ICD-10-CM | POA: Diagnosis not present

## 2017-05-06 DIAGNOSIS — T8144XA Sepsis following a procedure, initial encounter: Secondary | ICD-10-CM | POA: Diagnosis not present

## 2017-05-06 DIAGNOSIS — F17210 Nicotine dependence, cigarettes, uncomplicated: Secondary | ICD-10-CM | POA: Diagnosis present

## 2017-05-06 DIAGNOSIS — D3501 Benign neoplasm of right adrenal gland: Secondary | ICD-10-CM | POA: Diagnosis not present

## 2017-05-06 DIAGNOSIS — Z9049 Acquired absence of other specified parts of digestive tract: Secondary | ICD-10-CM

## 2017-05-06 DIAGNOSIS — K9189 Other postprocedural complications and disorders of digestive system: Secondary | ICD-10-CM

## 2017-05-06 DIAGNOSIS — K660 Peritoneal adhesions (postprocedural) (postinfection): Secondary | ICD-10-CM

## 2017-05-06 DIAGNOSIS — J15212 Pneumonia due to Methicillin resistant Staphylococcus aureus: Secondary | ICD-10-CM | POA: Diagnosis not present

## 2017-05-06 DIAGNOSIS — J189 Pneumonia, unspecified organism: Secondary | ICD-10-CM

## 2017-05-06 DIAGNOSIS — Z885 Allergy status to narcotic agent status: Secondary | ICD-10-CM

## 2017-05-06 DIAGNOSIS — E896 Postprocedural adrenocortical (-medullary) hypofunction: Secondary | ICD-10-CM

## 2017-05-06 DIAGNOSIS — D473 Essential (hemorrhagic) thrombocythemia: Secondary | ICD-10-CM | POA: Diagnosis not present

## 2017-05-06 DIAGNOSIS — K567 Ileus, unspecified: Secondary | ICD-10-CM

## 2017-05-06 DIAGNOSIS — J15211 Pneumonia due to Methicillin susceptible Staphylococcus aureus: Secondary | ICD-10-CM | POA: Diagnosis not present

## 2017-05-06 DIAGNOSIS — E46 Unspecified protein-calorie malnutrition: Secondary | ICD-10-CM | POA: Diagnosis not present

## 2017-05-06 DIAGNOSIS — R1084 Generalized abdominal pain: Secondary | ICD-10-CM | POA: Diagnosis not present

## 2017-05-06 DIAGNOSIS — J9600 Acute respiratory failure, unspecified whether with hypoxia or hypercapnia: Secondary | ICD-10-CM | POA: Diagnosis not present

## 2017-05-06 DIAGNOSIS — B9561 Methicillin susceptible Staphylococcus aureus infection as the cause of diseases classified elsewhere: Secondary | ICD-10-CM | POA: Diagnosis not present

## 2017-05-06 DIAGNOSIS — D638 Anemia in other chronic diseases classified elsewhere: Secondary | ICD-10-CM | POA: Diagnosis not present

## 2017-05-06 DIAGNOSIS — J9602 Acute respiratory failure with hypercapnia: Secondary | ICD-10-CM | POA: Diagnosis not present

## 2017-05-06 DIAGNOSIS — E8779 Other fluid overload: Secondary | ICD-10-CM | POA: Diagnosis not present

## 2017-05-06 DIAGNOSIS — K659 Peritonitis, unspecified: Secondary | ICD-10-CM

## 2017-05-06 DIAGNOSIS — F419 Anxiety disorder, unspecified: Secondary | ICD-10-CM

## 2017-05-06 DIAGNOSIS — T81509A Unspecified complication of foreign body accidentally left in body following unspecified procedure, initial encounter: Secondary | ICD-10-CM

## 2017-05-06 DIAGNOSIS — Z888 Allergy status to other drugs, medicaments and biological substances status: Secondary | ICD-10-CM

## 2017-05-06 DIAGNOSIS — Z86010 Personal history of colonic polyps: Secondary | ICD-10-CM

## 2017-05-06 DIAGNOSIS — K632 Fistula of intestine: Secondary | ICD-10-CM | POA: Diagnosis not present

## 2017-05-06 DIAGNOSIS — T17590A Other foreign object in bronchus causing asphyxiation, initial encounter: Secondary | ICD-10-CM | POA: Diagnosis not present

## 2017-05-06 DIAGNOSIS — M25562 Pain in left knee: Secondary | ICD-10-CM

## 2017-05-06 DIAGNOSIS — E0590 Thyrotoxicosis, unspecified without thyrotoxic crisis or storm: Secondary | ICD-10-CM | POA: Diagnosis present

## 2017-05-06 DIAGNOSIS — E43 Unspecified severe protein-calorie malnutrition: Secondary | ICD-10-CM

## 2017-05-06 DIAGNOSIS — K559 Vascular disorder of intestine, unspecified: Secondary | ICD-10-CM | POA: Diagnosis not present

## 2017-05-06 DIAGNOSIS — Z01810 Encounter for preprocedural cardiovascular examination: Secondary | ICD-10-CM

## 2017-05-06 DIAGNOSIS — Z6823 Body mass index (BMI) 23.0-23.9, adult: Secondary | ICD-10-CM

## 2017-05-06 DIAGNOSIS — J449 Chronic obstructive pulmonary disease, unspecified: Secondary | ICD-10-CM | POA: Diagnosis not present

## 2017-05-06 DIAGNOSIS — I517 Cardiomegaly: Secondary | ICD-10-CM | POA: Diagnosis not present

## 2017-05-06 DIAGNOSIS — I493 Ventricular premature depolarization: Secondary | ICD-10-CM | POA: Diagnosis not present

## 2017-05-06 DIAGNOSIS — G8918 Other acute postprocedural pain: Secondary | ICD-10-CM | POA: Diagnosis not present

## 2017-05-06 DIAGNOSIS — S31109A Unspecified open wound of abdominal wall, unspecified quadrant without penetration into peritoneal cavity, initial encounter: Secondary | ICD-10-CM | POA: Diagnosis not present

## 2017-05-06 DIAGNOSIS — R0602 Shortness of breath: Secondary | ICD-10-CM

## 2017-05-06 DIAGNOSIS — R0682 Tachypnea, not elsewhere classified: Secondary | ICD-10-CM

## 2017-05-06 DIAGNOSIS — Z800 Family history of malignant neoplasm of digestive organs: Secondary | ICD-10-CM

## 2017-05-06 DIAGNOSIS — F1120 Opioid dependence, uncomplicated: Secondary | ICD-10-CM | POA: Diagnosis present

## 2017-05-06 DIAGNOSIS — R918 Other nonspecific abnormal finding of lung field: Secondary | ICD-10-CM | POA: Diagnosis not present

## 2017-05-06 DIAGNOSIS — L0291 Cutaneous abscess, unspecified: Secondary | ICD-10-CM

## 2017-05-06 DIAGNOSIS — K5651 Intestinal adhesions [bands], with partial obstruction: Secondary | ICD-10-CM | POA: Diagnosis not present

## 2017-05-06 DIAGNOSIS — K633 Ulcer of intestine: Secondary | ICD-10-CM | POA: Diagnosis not present

## 2017-05-06 DIAGNOSIS — Z4682 Encounter for fitting and adjustment of non-vascular catheter: Secondary | ICD-10-CM | POA: Diagnosis not present

## 2017-05-06 DIAGNOSIS — I10 Essential (primary) hypertension: Secondary | ICD-10-CM | POA: Diagnosis present

## 2017-05-06 DIAGNOSIS — Z5331 Laparoscopic surgical procedure converted to open procedure: Secondary | ICD-10-CM

## 2017-05-06 DIAGNOSIS — K219 Gastro-esophageal reflux disease without esophagitis: Secondary | ICD-10-CM | POA: Diagnosis present

## 2017-05-06 DIAGNOSIS — D62 Acute posthemorrhagic anemia: Secondary | ICD-10-CM | POA: Diagnosis not present

## 2017-05-06 DIAGNOSIS — M5412 Radiculopathy, cervical region: Secondary | ICD-10-CM | POA: Diagnosis present

## 2017-05-06 DIAGNOSIS — L8990 Pressure ulcer of unspecified site, unspecified stage: Secondary | ICD-10-CM | POA: Insufficient documentation

## 2017-05-06 DIAGNOSIS — J9691 Respiratory failure, unspecified with hypoxia: Secondary | ICD-10-CM | POA: Diagnosis not present

## 2017-05-06 DIAGNOSIS — E042 Nontoxic multinodular goiter: Secondary | ICD-10-CM | POA: Diagnosis present

## 2017-05-06 DIAGNOSIS — R079 Chest pain, unspecified: Secondary | ICD-10-CM | POA: Diagnosis not present

## 2017-05-06 DIAGNOSIS — R739 Hyperglycemia, unspecified: Secondary | ICD-10-CM | POA: Diagnosis not present

## 2017-05-06 DIAGNOSIS — Z880 Allergy status to penicillin: Secondary | ICD-10-CM

## 2017-05-06 DIAGNOSIS — T8131XA Disruption of external operation (surgical) wound, not elsewhere classified, initial encounter: Secondary | ICD-10-CM | POA: Diagnosis not present

## 2017-05-06 DIAGNOSIS — T85898A Other specified complication of other internal prosthetic devices, implants and grafts, initial encounter: Secondary | ICD-10-CM | POA: Diagnosis not present

## 2017-05-06 DIAGNOSIS — Z803 Family history of malignant neoplasm of breast: Secondary | ICD-10-CM

## 2017-05-06 DIAGNOSIS — Z01811 Encounter for preprocedural respiratory examination: Secondary | ICD-10-CM

## 2017-05-06 DIAGNOSIS — G9341 Metabolic encephalopathy: Secondary | ICD-10-CM | POA: Diagnosis not present

## 2017-05-06 DIAGNOSIS — E785 Hyperlipidemia, unspecified: Secondary | ICD-10-CM | POA: Diagnosis present

## 2017-05-06 DIAGNOSIS — Y838 Other surgical procedures as the cause of abnormal reaction of the patient, or of later complication, without mention of misadventure at the time of the procedure: Secondary | ICD-10-CM | POA: Diagnosis present

## 2017-05-06 DIAGNOSIS — J9809 Other diseases of bronchus, not elsewhere classified: Secondary | ICD-10-CM

## 2017-05-06 DIAGNOSIS — C7491 Malignant neoplasm of unspecified part of right adrenal gland: Secondary | ICD-10-CM | POA: Diagnosis not present

## 2017-05-06 DIAGNOSIS — K55019 Acute (reversible) ischemia of small intestine, extent unspecified: Secondary | ICD-10-CM | POA: Diagnosis not present

## 2017-05-06 DIAGNOSIS — D3500 Benign neoplasm of unspecified adrenal gland: Secondary | ICD-10-CM | POA: Diagnosis not present

## 2017-05-06 DIAGNOSIS — S0990XA Unspecified injury of head, initial encounter: Secondary | ICD-10-CM | POA: Diagnosis not present

## 2017-05-06 DIAGNOSIS — R601 Generalized edema: Secondary | ICD-10-CM | POA: Diagnosis not present

## 2017-05-06 DIAGNOSIS — R112 Nausea with vomiting, unspecified: Secondary | ICD-10-CM | POA: Diagnosis not present

## 2017-05-06 DIAGNOSIS — R0902 Hypoxemia: Secondary | ICD-10-CM

## 2017-05-06 DIAGNOSIS — N179 Acute kidney failure, unspecified: Secondary | ICD-10-CM | POA: Diagnosis not present

## 2017-05-06 DIAGNOSIS — I959 Hypotension, unspecified: Secondary | ICD-10-CM | POA: Diagnosis not present

## 2017-05-06 DIAGNOSIS — R208 Other disturbances of skin sensation: Secondary | ICD-10-CM | POA: Diagnosis present

## 2017-05-06 DIAGNOSIS — R000 Tachycardia, unspecified: Secondary | ICD-10-CM | POA: Diagnosis not present

## 2017-05-06 DIAGNOSIS — Z9289 Personal history of other medical treatment: Secondary | ICD-10-CM

## 2017-05-06 DIAGNOSIS — G894 Chronic pain syndrome: Secondary | ICD-10-CM | POA: Diagnosis present

## 2017-05-06 DIAGNOSIS — K6389 Other specified diseases of intestine: Secondary | ICD-10-CM | POA: Diagnosis not present

## 2017-05-06 DIAGNOSIS — B961 Klebsiella pneumoniae [K. pneumoniae] as the cause of diseases classified elsewhere: Secondary | ICD-10-CM | POA: Diagnosis not present

## 2017-05-06 DIAGNOSIS — S34109A Unspecified injury to unspecified level of lumbar spinal cord, initial encounter: Secondary | ICD-10-CM | POA: Diagnosis not present

## 2017-05-06 DIAGNOSIS — J9811 Atelectasis: Secondary | ICD-10-CM | POA: Diagnosis not present

## 2017-05-06 HISTORY — PX: LAPAROSCOPIC ADRENALECTOMY: SHX999

## 2017-05-06 HISTORY — DX: Benign neoplasm of right adrenal gland: D35.01

## 2017-05-06 LAB — CREATININE, SERUM
CREATININE: 0.79 mg/dL (ref 0.61–1.24)
GFR calc Af Amer: >60 mL/min (ref >60)
GFR calc non Af Amer: >60 mL/min (ref >60)

## 2017-05-06 LAB — CBC
HCT: 35.8 % — ABNORMAL LOW (ref 39.0–52.0)
Hemoglobin: 12.2 g/dL — ABNORMAL LOW (ref 13.0–17.0)
MCH: 27.5 pg (ref 26.0–34.0)
MCHC: 34.1 g/dL (ref 30.0–36.0)
MCV: 80.6 fL (ref 78.0–100.0)
PLATELETS: 315 10*3/uL (ref 150–400)
RBC: 4.44 MIL/uL (ref 4.22–5.81)
RDW: 15.3 % (ref 11.5–15.5)
WBC: 21.7 10*3/uL — ABNORMAL HIGH (ref 4.0–10.5)

## 2017-05-06 LAB — PREPARE RBC (CROSSMATCH)

## 2017-05-06 LAB — ABO/RH: ABO/RH(D): A NEG

## 2017-05-06 LAB — MRSA PCR SCREENING: MRSA by PCR: NEGATIVE

## 2017-05-06 SURGERY — ADRENALECTOMY, LAPAROSCOPIC
Anesthesia: General | Laterality: Right

## 2017-05-06 MED ORDER — ESMOLOL HCL 100 MG/10ML IV SOLN
INTRAVENOUS | Status: AC
  Filled 2017-05-06: qty 10

## 2017-05-06 MED ORDER — ATORVASTATIN CALCIUM 10 MG PO TABS
10.0000 mg | ORAL_TABLET | Freq: Every day | ORAL | Status: DC
  Administered 2017-05-06: 10 mg via ORAL
  Filled 2017-05-06: qty 1

## 2017-05-06 MED ORDER — DIPHENHYDRAMINE HCL 12.5 MG/5ML PO ELIX
12.5000 mg | ORAL_SOLUTION | Freq: Four times a day (QID) | ORAL | Status: DC | PRN
  Filled 2017-05-06: qty 5

## 2017-05-06 MED ORDER — ACETAMINOPHEN 325 MG PO TABS
650.0000 mg | ORAL_TABLET | Freq: Four times a day (QID) | ORAL | Status: DC | PRN
  Administered 2017-05-07: 650 mg via ORAL
  Filled 2017-05-06: qty 2

## 2017-05-06 MED ORDER — ONDANSETRON 4 MG PO TBDP
4.0000 mg | ORAL_TABLET | Freq: Four times a day (QID) | ORAL | Status: DC | PRN
  Filled 2017-05-06 (×2): qty 1

## 2017-05-06 MED ORDER — HEPARIN SODIUM (PORCINE) 5000 UNIT/ML IJ SOLN
5000.0000 [IU] | Freq: Once | INTRAMUSCULAR | Status: AC
  Administered 2017-05-06: 5000 [IU] via SUBCUTANEOUS
  Filled 2017-05-06: qty 1

## 2017-05-06 MED ORDER — BUPIVACAINE LIPOSOME 1.3 % IJ SUSP
20.0000 mL | Freq: Once | INTRAMUSCULAR | Status: AC
  Administered 2017-05-06: 20 mL
  Filled 2017-05-06: qty 20

## 2017-05-06 MED ORDER — ESMOLOL HCL 100 MG/10ML IV SOLN: INTRAVENOUS | Status: DC | PRN

## 2017-05-06 MED ORDER — ONDANSETRON HCL 4 MG/2ML IJ SOLN
4.0000 mg | Freq: Four times a day (QID) | INTRAMUSCULAR | Status: DC | PRN
Start: 2017-05-06 — End: 2017-06-26
  Administered 2017-05-06 – 2017-06-25 (×21): 4 mg via INTRAVENOUS
  Filled 2017-05-06 (×21): qty 2

## 2017-05-06 MED ORDER — LIDOCAINE 2% (20 MG/ML) 5 ML SYRINGE
INTRAMUSCULAR | Status: AC
  Filled 2017-05-06: qty 10

## 2017-05-06 MED ORDER — SODIUM CHLORIDE 0.9 % IV SOLN
INTRAVENOUS | Status: DC
  Administered 2017-05-06 – 2017-05-08 (×4): via INTRAVENOUS

## 2017-05-06 MED ORDER — ESMOLOL HCL 100 MG/10ML IV SOLN
INTRAVENOUS | Status: DC | PRN
  Administered 2017-05-06: 10 mg via INTRAVENOUS

## 2017-05-06 MED ORDER — MIDAZOLAM HCL 5 MG/5ML IJ SOLN
INTRAMUSCULAR | Status: DC | PRN
  Administered 2017-05-06: 2 mg via INTRAVENOUS

## 2017-05-06 MED ORDER — HYDROMORPHONE HCL-NACL 0.5-0.9 MG/ML-% IV SOSY
PREFILLED_SYRINGE | INTRAVENOUS | Status: AC
  Filled 2017-05-06: qty 2

## 2017-05-06 MED ORDER — DIPHENHYDRAMINE HCL 12.5 MG/5ML PO ELIX: 12.5000 mg | ORAL_SOLUTION | Freq: Four times a day (QID) | ORAL | Status: DC | PRN

## 2017-05-06 MED ORDER — HYDROMORPHONE HCL-NACL 0.5-0.9 MG/ML-% IV SOSY
0.5000 mg | PREFILLED_SYRINGE | INTRAVENOUS | Status: DC | PRN
  Administered 2017-05-06: 0.5 mg via INTRAVENOUS
  Filled 2017-05-06: qty 1

## 2017-05-06 MED ORDER — BISACODYL 5 MG PO TBEC: 5.0000 mg | DELAYED_RELEASE_TABLET | Freq: Every day | ORAL | Status: DC | PRN

## 2017-05-06 MED ORDER — NALOXONE HCL 0.4 MG/ML IJ SOLN: 0.4000 mg | INTRAMUSCULAR | Status: DC | PRN

## 2017-05-06 MED ORDER — OXYCODONE HCL 5 MG PO TABS
5.0000 mg | ORAL_TABLET | ORAL | Status: DC | PRN
  Administered 2017-05-06 – 2017-05-07 (×3): 10 mg via ORAL
  Filled 2017-05-06 (×3): qty 2

## 2017-05-06 MED ORDER — OXYCODONE HCL 5 MG PO TABS: 5.0000 mg | ORAL_TABLET | Freq: Once | ORAL | Status: DC | PRN

## 2017-05-06 MED ORDER — CHLORHEXIDINE GLUCONATE CLOTH 2 % EX PADS: 6.0000 | MEDICATED_PAD | Freq: Once | CUTANEOUS | Status: DC

## 2017-05-06 MED ORDER — PHENYLEPHRINE HCL 10 MG/ML IJ SOLN
INTRAVENOUS | Status: DC | PRN
  Administered 2017-05-06: 15 ug/min via INTRAVENOUS

## 2017-05-06 MED ORDER — PANTOPRAZOLE SODIUM 40 MG PO TBEC
40.0000 mg | DELAYED_RELEASE_TABLET | Freq: Every day | ORAL | Status: DC
  Administered 2017-05-07 – 2017-05-09 (×3): 40 mg via ORAL
  Filled 2017-05-06 (×3): qty 1

## 2017-05-06 MED ORDER — HYDROMORPHONE HCL 2 MG/ML IJ SOLN
INTRAMUSCULAR | Status: AC
  Filled 2017-05-06: qty 1

## 2017-05-06 MED ORDER — ACETAMINOPHEN 650 MG RE SUPP: 650.0000 mg | Freq: Four times a day (QID) | RECTAL | Status: DC | PRN

## 2017-05-06 MED ORDER — ROCURONIUM BROMIDE 50 MG/5ML IV SOSY
PREFILLED_SYRINGE | INTRAVENOUS | Status: AC
Start: 2017-05-06 — End: ?
  Filled 2017-05-06: qty 5

## 2017-05-06 MED ORDER — MORPHINE SULFATE 2 MG/ML IV SOLN
INTRAVENOUS | Status: DC
  Administered 2017-05-06: 17:00:00 via INTRAVENOUS
  Filled 2017-05-06: qty 30

## 2017-05-06 MED ORDER — MEPERIDINE HCL 50 MG/ML IJ SOLN: 6.2500 mg | INTRAMUSCULAR | Status: DC | PRN

## 2017-05-06 MED ORDER — ALBUMIN HUMAN 5 % IV SOLN
INTRAVENOUS | Status: AC
  Filled 2017-05-06: qty 250

## 2017-05-06 MED ORDER — VANCOMYCIN HCL IN DEXTROSE 1-5 GM/200ML-% IV SOLN
1000.0000 mg | INTRAVENOUS | Status: AC
  Administered 2017-05-06: 1000 mg via INTRAVENOUS
  Filled 2017-05-06: qty 200

## 2017-05-06 MED ORDER — SODIUM CHLORIDE 0.9 % IV SOLN: 10.0000 mL/h | Freq: Once | INTRAVENOUS | Status: DC

## 2017-05-06 MED ORDER — IRBESARTAN 75 MG PO TABS
37.5000 mg | ORAL_TABLET | Freq: Every day | ORAL | Status: DC
  Administered 2017-05-06 – 2017-05-07 (×2): 37.5 mg via ORAL
  Filled 2017-05-06 (×2): qty 0.5

## 2017-05-06 MED ORDER — SIMETHICONE 80 MG PO CHEW
40.0000 mg | CHEWABLE_TABLET | Freq: Four times a day (QID) | ORAL | Status: DC | PRN
  Administered 2017-05-08 – 2017-05-09 (×2): 40 mg via ORAL
  Filled 2017-05-06 (×2): qty 1

## 2017-05-06 MED ORDER — OXYCODONE HCL 5 MG/5ML PO SOLN
5.0000 mg | Freq: Once | ORAL | Status: DC | PRN
  Filled 2017-05-06: qty 5

## 2017-05-06 MED ORDER — HYDROMORPHONE HCL-NACL 0.5-0.9 MG/ML-% IV SOSY
0.2500 mg | PREFILLED_SYRINGE | INTRAVENOUS | Status: DC | PRN
  Administered 2017-05-06 (×2): 0.5 mg via INTRAVENOUS

## 2017-05-06 MED ORDER — SODIUM CHLORIDE 0.9% FLUSH: 9.0000 mL | INTRAVENOUS | Status: DC | PRN

## 2017-05-06 MED ORDER — ROCURONIUM BROMIDE 50 MG/5ML IV SOSY
PREFILLED_SYRINGE | INTRAVENOUS | Status: AC
  Filled 2017-05-06: qty 5

## 2017-05-06 MED ORDER — PROPOFOL 10 MG/ML IV BOLUS
INTRAVENOUS | Status: AC
  Filled 2017-05-06: qty 20

## 2017-05-06 MED ORDER — NITROGLYCERIN IN D5W 200-5 MCG/ML-% IV SOLN
0.0000 ug/min | INTRAVENOUS | Status: DC
  Filled 2017-05-06: qty 250

## 2017-05-06 MED ORDER — NICOTINE 21 MG/24HR TD PT24
21.0000 mg | MEDICATED_PATCH | Freq: Every day | TRANSDERMAL | Status: DC
  Administered 2017-05-06 – 2017-06-27 (×53): 21 mg via TRANSDERMAL
  Filled 2017-05-06 (×52): qty 1

## 2017-05-06 MED ORDER — ONDANSETRON HCL 4 MG/2ML IJ SOLN: 4.0000 mg | Freq: Four times a day (QID) | INTRAMUSCULAR | Status: DC | PRN

## 2017-05-06 MED ORDER — SUGAMMADEX SODIUM 200 MG/2ML IV SOLN
INTRAVENOUS | Status: DC | PRN
  Administered 2017-05-06: 200 mg via INTRAVENOUS

## 2017-05-06 MED ORDER — METHOCARBAMOL 500 MG PO TABS
500.0000 mg | ORAL_TABLET | Freq: Two times a day (BID) | ORAL | Status: DC
  Administered 2017-05-06 – 2017-05-08 (×4): 500 mg via ORAL
  Filled 2017-05-06 (×5): qty 1

## 2017-05-06 MED ORDER — PHENYLEPHRINE HCL-NACL 10-0.9 MG/250ML-% IV SOLN
0.0000 ug/min | INTRAVENOUS | Status: DC
Start: 2017-05-06 — End: 2017-05-07
  Filled 2017-05-06: qty 250

## 2017-05-06 MED ORDER — HYDROMORPHONE HCL 1 MG/ML IJ SOLN
INTRAMUSCULAR | Status: DC | PRN
  Administered 2017-05-06 (×4): 0.5 mg via INTRAVENOUS

## 2017-05-06 MED ORDER — ADULT MULTIVITAMIN W/MINERALS CH
1.0000 | ORAL_TABLET | Freq: Every day | ORAL | Status: DC
  Administered 2017-05-06 – 2017-05-09 (×4): 1 via ORAL
  Filled 2017-05-06 (×4): qty 1

## 2017-05-06 MED ORDER — FENTANYL CITRATE (PF) 100 MCG/2ML IJ SOLN
INTRAMUSCULAR | Status: DC | PRN
  Administered 2017-05-06: 200 ug via INTRAVENOUS
  Administered 2017-05-06: 50 ug via INTRAVENOUS

## 2017-05-06 MED ORDER — LORAZEPAM 1 MG PO TABS
1.0000 mg | ORAL_TABLET | Freq: Two times a day (BID) | ORAL | Status: DC | PRN
  Administered 2017-05-06 – 2017-05-09 (×2): 1 mg via ORAL
  Filled 2017-05-06 (×2): qty 1

## 2017-05-06 MED ORDER — ACETAMINOPHEN 500 MG PO TABS
1000.0000 mg | ORAL_TABLET | ORAL | Status: AC
  Administered 2017-05-06: 1000 mg via ORAL
  Filled 2017-05-06: qty 2

## 2017-05-06 MED ORDER — DIPHENHYDRAMINE HCL 50 MG/ML IJ SOLN: 12.5000 mg | Freq: Four times a day (QID) | INTRAMUSCULAR | Status: DC | PRN

## 2017-05-06 MED ORDER — 0.9 % SODIUM CHLORIDE (POUR BTL) OPTIME
TOPICAL | Status: DC | PRN
  Administered 2017-05-06: 2000 mL

## 2017-05-06 MED ORDER — HYDROMORPHONE HCL-NACL 0.5-0.9 MG/ML-% IV SOSY
0.5000 mg | PREFILLED_SYRINGE | INTRAVENOUS | Status: DC | PRN
  Administered 2017-05-07 (×3): 1 mg via INTRAVENOUS
  Administered 2017-05-07 – 2017-05-08 (×4): 2 mg via INTRAVENOUS
  Administered 2017-05-08: 1 mg via INTRAVENOUS
  Administered 2017-05-08 – 2017-05-09 (×5): 2 mg via INTRAVENOUS
  Filled 2017-05-06: qty 2
  Filled 2017-05-06 (×2): qty 4
  Filled 2017-05-06 (×2): qty 2
  Filled 2017-05-06: qty 4
  Filled 2017-05-06: qty 2
  Filled 2017-05-06 (×6): qty 4

## 2017-05-06 MED ORDER — POTASSIUM CHLORIDE CRYS ER 20 MEQ PO TBCR
20.0000 meq | EXTENDED_RELEASE_TABLET | Freq: Every day | ORAL | Status: DC
  Administered 2017-05-06 – 2017-05-13 (×7): 20 meq via ORAL
  Filled 2017-05-06 (×7): qty 1

## 2017-05-06 MED ORDER — ENOXAPARIN SODIUM 40 MG/0.4ML ~~LOC~~ SOLN
40.0000 mg | SUBCUTANEOUS | Status: DC
  Administered 2017-05-07 – 2017-05-31 (×23): 40 mg via SUBCUTANEOUS
  Filled 2017-05-06 (×23): qty 0.4

## 2017-05-06 MED ORDER — MIDAZOLAM HCL 2 MG/2ML IJ SOLN
INTRAMUSCULAR | Status: AC
  Filled 2017-05-06: qty 2

## 2017-05-06 MED ORDER — PROMETHAZINE HCL 25 MG/ML IJ SOLN: 6.2500 mg | INTRAMUSCULAR | Status: DC | PRN

## 2017-05-06 MED ORDER — LACTATED RINGERS IV SOLN
INTRAVENOUS | Status: DC
  Administered 2017-05-06 (×3): via INTRAVENOUS

## 2017-05-06 MED ORDER — SODIUM CHLORIDE 0.9 % IV SOLN: INTRAVENOUS | Status: DC | PRN

## 2017-05-06 MED ORDER — PROPOFOL 10 MG/ML IV BOLUS
INTRAVENOUS | Status: DC | PRN
  Administered 2017-05-06: 200 mg via INTRAVENOUS
  Administered 2017-05-06: 20 mg via INTRAVENOUS

## 2017-05-06 MED ORDER — HYDROMORPHONE 1 MG/ML IV SOLN
INTRAVENOUS | Status: DC
  Administered 2017-05-06: 1 mg via INTRAVENOUS
  Administered 2017-05-07: 4.2 mg via INTRAVENOUS
  Administered 2017-05-07: 1.5 mg via INTRAVENOUS
  Administered 2017-05-07: 3.3 mg via INTRAVENOUS
  Administered 2017-05-07: 3.56 mg via INTRAVENOUS
  Administered 2017-05-07: 2.7 mg via INTRAVENOUS
  Administered 2017-05-07: 3.3 mg via INTRAVENOUS
  Administered 2017-05-08: 3.6 mg via INTRAVENOUS
  Administered 2017-05-08: 2.1 mg via INTRAVENOUS
  Administered 2017-05-08: 3.6 mg via INTRAVENOUS
  Administered 2017-05-08: 3.9 mg via INTRAVENOUS
  Administered 2017-05-08: 4.5 mg via INTRAVENOUS
  Administered 2017-05-08: 2.7 mg via INTRAVENOUS
  Administered 2017-05-08 – 2017-05-09 (×3): 1.5 mg via INTRAVENOUS
  Administered 2017-05-09: 2.1 mg via INTRAVENOUS
  Administered 2017-05-09: 0.3 mg via INTRAVENOUS
  Administered 2017-05-09 (×2): 1.5 mg via INTRAVENOUS
  Filled 2017-05-06 (×2): qty 25

## 2017-05-06 MED ORDER — LIDOCAINE 2% (20 MG/ML) 5 ML SYRINGE
INTRAMUSCULAR | Status: DC | PRN
  Administered 2017-05-06: 50 mg via INTRAVENOUS

## 2017-05-06 MED ORDER — GLYCOPYRROLATE 0.2 MG/ML IV SOSY
PREFILLED_SYRINGE | INTRAVENOUS | Status: DC | PRN
  Administered 2017-05-06: .2 mg via INTRAVENOUS

## 2017-05-06 MED ORDER — SUCCINYLCHOLINE CHLORIDE 200 MG/10ML IV SOSY
PREFILLED_SYRINGE | INTRAVENOUS | Status: AC
  Filled 2017-05-06: qty 10

## 2017-05-06 MED ORDER — BUPIVACAINE-EPINEPHRINE 0.25% -1:200000 IJ SOLN
INTRAMUSCULAR | Status: DC | PRN
  Administered 2017-05-06: 30 mL

## 2017-05-06 MED ORDER — GABAPENTIN 100 MG PO CAPS: 100.0000 mg | ORAL_CAPSULE | Freq: Two times a day (BID) | ORAL | Status: DC | PRN

## 2017-05-06 MED ORDER — ALBUMIN HUMAN 5 % IV SOLN
INTRAVENOUS | Status: DC | PRN
  Administered 2017-05-06 (×2): via INTRAVENOUS

## 2017-05-06 MED ORDER — DIPHENHYDRAMINE HCL 50 MG/ML IJ SOLN: 25.0000 mg | Freq: Four times a day (QID) | INTRAMUSCULAR | Status: DC | PRN

## 2017-05-06 MED ORDER — BUPIVACAINE-EPINEPHRINE (PF) 0.25% -1:200000 IJ SOLN
INTRAMUSCULAR | Status: AC
  Filled 2017-05-06: qty 30

## 2017-05-06 MED ORDER — FENTANYL CITRATE (PF) 250 MCG/5ML IJ SOLN
INTRAMUSCULAR | Status: AC
  Filled 2017-05-06: qty 5

## 2017-05-06 MED ORDER — DEXAMETHASONE SODIUM PHOSPHATE 10 MG/ML IJ SOLN
INTRAMUSCULAR | Status: DC | PRN
  Administered 2017-05-06: 10 mg via INTRAVENOUS

## 2017-05-06 MED ORDER — NITROPRUSSIDE SODIUM 25 MG/ML IV SOLN
0.0000 ug/kg/min | INTRAVENOUS | Status: DC
  Administered 2017-05-06: .3 ug/kg/min via INTRAVENOUS
  Filled 2017-05-06: qty 2

## 2017-05-06 MED ORDER — ROCURONIUM BROMIDE 10 MG/ML (PF) SYRINGE
PREFILLED_SYRINGE | INTRAVENOUS | Status: DC | PRN
  Administered 2017-05-06: 50 mg via INTRAVENOUS
  Administered 2017-05-06: 15 mg via INTRAVENOUS
  Administered 2017-05-06: 10 mg via INTRAVENOUS
  Administered 2017-05-06: 15 mg via INTRAVENOUS
  Administered 2017-05-06 (×3): 20 mg via INTRAVENOUS

## 2017-05-06 MED ORDER — LACTATED RINGERS IV SOLN
INTRAVENOUS | Status: DC
  Administered 2017-05-06: 14:00:00 via INTRAVENOUS
  Administered 2017-05-06: 1000 mL via INTRAVENOUS

## 2017-05-06 MED ORDER — GABAPENTIN 300 MG PO CAPS
300.0000 mg | ORAL_CAPSULE | ORAL | Status: AC
  Administered 2017-05-06: 300 mg via ORAL
  Filled 2017-05-06: qty 1

## 2017-05-06 MED ORDER — DIPHENHYDRAMINE HCL 25 MG PO CAPS: 25.0000 mg | ORAL_CAPSULE | Freq: Four times a day (QID) | ORAL | Status: DC | PRN

## 2017-05-06 MED ORDER — LABETALOL HCL 100 MG PO TABS
50.0000 mg | ORAL_TABLET | Freq: Two times a day (BID) | ORAL | Status: DC
  Administered 2017-05-07 – 2017-05-13 (×12): 50 mg via ORAL
  Filled 2017-05-06 (×12): qty 1

## 2017-05-06 SURGICAL SUPPLY — 68 items
APPLIER CLIP ROT 10 11.4 M/L (STAPLE)
BAG LAPAROSCOPIC 12 15 PORT 16 (BASKET) IMPLANT
BAG RETRIEVAL 12/15 (BASKET)
BLADE EXTENDED COATED 6.5IN (ELECTRODE) ×2 IMPLANT
BLADE HEX COATED 2.75 (ELECTRODE) IMPLANT
CHLORAPREP W/TINT 26ML (MISCELLANEOUS) ×2 IMPLANT
CLIP APPLIE ROT 10 11.4 M/L (STAPLE) IMPLANT
CLIP TI LARGE 6 (CLIP) ×4 IMPLANT
CONNECTOR 5 IN 1 STRAIGHT STRL (MISCELLANEOUS) ×2 IMPLANT
COVER MAYO STAND STRL (DRAPES) ×4 IMPLANT
COVER SURGICAL LIGHT HANDLE (MISCELLANEOUS) ×2 IMPLANT
DECANTER SPIKE VIAL GLASS SM (MISCELLANEOUS) ×2 IMPLANT
DISSECTOR BLUNT TIP ENDO 5MM (MISCELLANEOUS) ×2 IMPLANT
DISSECTOR ROUND CHERRY 3/8 STR (MISCELLANEOUS) ×2 IMPLANT
DRAIN CHANNEL RND F F (WOUND CARE) IMPLANT
DRAPE LAPAROSCOPIC ABDOMINAL (DRAPES) ×2 IMPLANT
DRAPE WARM FLUID 44X44 (DRAPE) ×4 IMPLANT
DRSG VAC ATS MED SENSATRAC (GAUZE/BANDAGES/DRESSINGS) ×2 IMPLANT
ELECT BLADE TIP CTD 4 INCH (ELECTRODE) ×2 IMPLANT
ELECT CAUTERY BLADE TIP 2.5 (TIP)
ELECT PENCIL ROCKER SW 15FT (MISCELLANEOUS) ×2 IMPLANT
ELECTRODE CAUTERY BLDE TIP 2.5 (TIP) IMPLANT
EVACUATOR SILICONE 100CC (DRAIN) IMPLANT
GLOVE BIOGEL PI IND STRL 7.0 (GLOVE) ×1 IMPLANT
GLOVE BIOGEL PI INDICATOR 7.0 (GLOVE) ×1
GLOVE SURG ORTHO 8.0 STRL STRW (GLOVE) ×2 IMPLANT
GOWN STRL REUS W/TWL LRG LVL3 (GOWN DISPOSABLE) ×2 IMPLANT
GOWN STRL REUS W/TWL XL LVL3 (GOWN DISPOSABLE) ×4 IMPLANT
HEMOSTAT SURGICEL 4X8 (HEMOSTASIS) ×2 IMPLANT
IRRIG SUCT STRYKERFLOW 2 WTIP (MISCELLANEOUS) ×2
IRRIGATION SUCT STRKRFLW 2 WTP (MISCELLANEOUS) ×1 IMPLANT
KIT BASIN OR (CUSTOM PROCEDURE TRAY) ×2 IMPLANT
NS IRRIG 1000ML POUR BTL (IV SOLUTION) ×2 IMPLANT
PAD POSITIONING PINK XL (MISCELLANEOUS) IMPLANT
POSITIONER SURGICAL ARM (MISCELLANEOUS) IMPLANT
POUCH SPECIMEN RETRIEVAL 10MM (ENDOMECHANICALS) IMPLANT
RELOAD PROXIMATE 75MM BLUE (ENDOMECHANICALS) ×4 IMPLANT
RELOAD STAPLER BLUE 60MM (STAPLE) IMPLANT
RELOAD STAPLER WHITE 60MM (STAPLE) IMPLANT
SCISSORS LAP 5X35 DISP (ENDOMECHANICALS) ×2 IMPLANT
SHEARS HARMONIC ACE PLUS 36CM (ENDOMECHANICALS) ×2 IMPLANT
SOLUTION ANTI FOG 6CC (MISCELLANEOUS) ×2 IMPLANT
SPONGE LAP 18X18 X RAY DECT (DISPOSABLE) ×4 IMPLANT
STAPLER GUN LINEAR PROX 60 (STAPLE) ×2 IMPLANT
STAPLER RELOAD BLUE 60MM (STAPLE)
STAPLER RELOAD WHITE 60MM (STAPLE)
STAPLER VISISTAT 35W (STAPLE) ×2 IMPLANT
SUT ETHILON 3 0 PS 1 (SUTURE) IMPLANT
SUT MNCRL AB 4-0 PS2 18 (SUTURE) ×2 IMPLANT
SUT PDS AB 0 CT1 36 (SUTURE) ×8 IMPLANT
SUT SILK 3 0 SH CR/8 (SUTURE) ×4 IMPLANT
SUT VIC AB 3-0 SH 27 (SUTURE) ×1
SUT VIC AB 3-0 SH 27XBRD (SUTURE) ×1 IMPLANT
SYR BULB IRRIGATION 50ML (SYRINGE) ×2 IMPLANT
SYS LAPSCP GELPORT 120MM (MISCELLANEOUS)
SYSTEM LAPSCP GELPORT 120MM (MISCELLANEOUS) IMPLANT
TAPE CLOTH 4X10 WHT NS (GAUZE/BANDAGES/DRESSINGS) ×2 IMPLANT
TOWEL OR 17X26 10 PK STRL BLUE (TOWEL DISPOSABLE) ×2 IMPLANT
TRAY FOLEY W/METER SILVER 16FR (SET/KITS/TRAYS/PACK) ×2 IMPLANT
TRAY LAPAROSCOPIC (CUSTOM PROCEDURE TRAY) ×2 IMPLANT
TROCAR XCEL 12X100 BLDLESS (ENDOMECHANICALS) IMPLANT
TROCAR XCEL BLUNT TIP 100MML (ENDOMECHANICALS) IMPLANT
TROCAR XCEL NON-BLD 11X100MML (ENDOMECHANICALS) IMPLANT
TROCAR XCEL UNIV SLVE 11M 100M (ENDOMECHANICALS) IMPLANT
TUBING CONNECTING 10 (TUBING) ×2 IMPLANT
TUBING INSUF HEATED (TUBING) ×2 IMPLANT
WATER STERILE IRR 1500ML POUR (IV SOLUTION) ×2 IMPLANT
YANKAUER SUCT BULB TIP 10FT TU (MISCELLANEOUS) ×2 IMPLANT

## 2017-05-06 NOTE — Anesthesia Preprocedure Evaluation (Addendum)
Anesthesia Evaluation  Patient identified by MRN, date of birth, ID band Patient awake    Reviewed: Allergy & Precautions, H&P , NPO status , Patient's Chart, lab work & pertinent test results  History of Anesthesia Complications Negative for: history of anesthetic complications  Airway Mallampati: II   Neck ROM: full    Dental  (+) Teeth Intact   Pulmonary Current Smoker,  Has cough , on antiibiotics   breath sounds clear to auscultation       Cardiovascular hypertension,  Rhythm:Regular Rate:Normal     Neuro/Psych  Headaches,    GI/Hepatic GERD  ,  Endo/Other    Renal/GU      Musculoskeletal  (+) Arthritis ,   Abdominal   Peds  Hematology   Anesthesia Other Findings   Reproductive/Obstetrics                             Anesthesia Physical  Anesthesia Plan  ASA: III  Anesthesia Plan: General   Post-op Pain Management:    Induction: Intravenous  PONV Risk Score and Plan: 3 and Ondansetron, Dexamethasone, Propofol, Midazolam and Treatment may vary due to age or medical condition  Airway Management Planned: Oral ETT  Additional Equipment: Arterial line  Intra-op Plan:   Post-operative Plan: Extubation in OR  Informed Consent: I have reviewed the patients History and Physical, chart, labs and discussed the procedure including the risks, benefits and alternatives for the proposed anesthesia with the patient or authorized representative who has indicated his/her understanding and acceptance.   Dental advisory given  Plan Discussed with: CRNA and Surgeon  Anesthesia Plan Comments:        Anesthesia Quick Evaluation

## 2017-05-06 NOTE — Op Note (Signed)
Preoperative diagnosis: right adrenal mass  Postoperative diagnosis: same   Procedure: laparoscopic converted to open right adrenalectomy, small bowel resection with anastomosis, placement of 20cm^2 wound vac  Surgeon: Gurney Maxin, M.D.  Asst: Ralene Ok, M.D.  Anesthesia: general  Indications for procedure: [name] [name] is a 56 y.o. year old male with findings of enlarging right adrenal mass. He also had labs suspicious  .  Description of procedure: The patient was brought into the operative suite. Anesthesia was administered with General endotracheal anesthesia. WHO checklist was applied. The patient was then placed in left side down decubitus position. The area was prepped and draped in the usual sterile fashion.  Next, a 2cm incision was made at the midclavicular line and cautery was used to dissect through the layers of fascia and muscle. Finger dissection was used to attempt to identify the peritoneum but no cavity was found. An additional layer was incised and intestinal mucosa was identified. This injury was repaired with a running 3-0 silk temporarily. The trocar was then introduced into the area outside of the intestine. Insufflation occurred with low flow and appropriate abdominal dissection. On insertion of the laparoscope there wer dense adhesions with intestine adhered to the abdominal wall throughout the right upper quadrant. Since no cavity could be identified, decision was made to attempt one additional access. Therefore, the patient was rotated towards flat and small incision was made at the upper midline and a 52mm trocar was used to gain optical entry. On insertion of the laparoscope the liver was identified as well as multiple adhesions of intestine to the liver and abdominal wall. Some blunt dissection was used to try to create a space for an additional trocar, however, no abdominal wall was able to be freed. Therefore, decision was made to convert to an open  procedure.  A right side subcostal incision was made. Cautery was used to dissect through the fascia and rectus muscles. 60 minutes of blunt and sharp dissection of the intestine occurred to free the injured area away from the abdominal wall and liver as well as free the abdominal wall and liver in order to complete the procedure. Multiple serosal tears occurred. Decision was made to resect 10cm of small intestine. 27mm GIA blue load staplers were used to divide the intestine at each end of the injured bowel. Harmonic scalpel was used to take the mesentery. A 33mm GIA blue load stapler was used to create an anastomosis and a 65mm TA blue load stapler was used to close the enterotomy. One additional area has a serosal tear that was repaired with interrupted 3-0 silk sutures. Multiple filmy adhesions of the liver to the diaphragm were present and taken with cautery. This allowed retraction of the liver. A fair amount of rare liver edge was visible after the adhesiolysis, Cautery was used to provide hemostasis at this area.  A Bookwalker retractor was put in place. The duodenum was Kocherized in order to identify the IVC and once identified the peritoneum was incised along the lateral border of the IVC. Further dissection allowed identification of the right renal vein.The IVC border was dissected free cephalad until the right adrenal vein was identified. This was clipped 3 times on the proximal side and twice on the distal side and then divided. Additional cautery and harmonic scalpel was used to free the adrenal gland from the superior pole of the kidney and posterior diaphragm. A mass was palpated in the lower end of the adrenal gland. The gland was removed  and sent for pathology. The area was irrigated. Surgicel was used at the bare liver edge.  The incision fascia was closed in 2 layers using 0 PDS. Due to contamination, the skin was left open and a black sponge gauze was put in place for dressing. The 29mm  trocar site was closed with a 4-0 monocryl suture and covered with a band-aid.  Findings: large amount of dense adhesions, palpable mass in right adrenal gland, normal venous anatomy of right adrenal gland.  Specimen: right adrenal gland, small bowel  Implant: none   Blood loss: 159ml  Local anesthesia: 50 ml Exparel:Marcaine Mix  Complications: injury to small intestine leading to small bowel resection and conversion to open case due to dense adhesions throughout the abdomen.  Gurney Maxin, M.D. General, Bariatric, & Minimally Invasive Surgery [facility]

## 2017-05-06 NOTE — Anesthesia Procedure Notes (Addendum)
Arterial Line Insertion Start/End7/08/2017 11:31 AM, 05/06/2017 11:31 AM Performed by: Lollie Sails, CRNA  Patient location: Pre-op. Lidocaine 1% used for infiltration Left, radial was placed Catheter size: 20 G Hand hygiene performed  and maximum sterile barriers used  Allen's test indicative of satisfactory collateral circulation Attempts: 1 Procedure performed without using ultrasound guided technique. Following insertion, dressing applied and Biopatch. Post procedure assessment: normal  Patient tolerated the procedure well with no immediate complications.

## 2017-05-06 NOTE — Anesthesia Procedure Notes (Signed)
Procedure Name: Intubation Date/Time: 05/06/2017 12:00 PM Performed by: Vibha Ferdig, Virgel Gess Pre-anesthesia Checklist: Patient identified, Emergency Drugs available, Suction available, Patient being monitored and Timeout performed Patient Re-evaluated:Patient Re-evaluated prior to induction Oxygen Delivery Method: Circle system utilized Preoxygenation: Pre-oxygenation with 100% oxygen Induction Type: IV induction Ventilation: Mask ventilation without difficulty Laryngoscope Size: Mac and 4 Grade View: Grade II Tube type: Oral Tube size: 7.5 mm Number of attempts: 1 Airway Equipment and Method: Stylet Placement Confirmation: ETT inserted through vocal cords under direct vision,  positive ETCO2,  CO2 detector and breath sounds checked- equal and bilateral Secured at: 22 cm Tube secured with: Tape Dental Injury: Teeth and Oropharynx as per pre-operative assessment

## 2017-05-06 NOTE — Anesthesia Postprocedure Evaluation (Signed)
Anesthesia Post Note  Patient: [name] [name]  Procedure(s) Performed: Procedure(s) (LRB): OPEN RIGHT ADRENALECTOMY WITH SMALL BOWEL RESECTION AND ANASTOMOSIS (Right)     Patient location during evaluation: PACU Anesthesia Type: General Level of consciousness: awake and alert Pain management: pain level controlled Vital Signs Assessment: post-procedure vital signs reviewed and stable Respiratory status: spontaneous breathing, nonlabored ventilation and respiratory function stable Cardiovascular status: blood pressure returned to baseline and stable Postop Assessment: no signs of nausea or vomiting Anesthetic complications: no    Last Vitals:  Vitals:   05/06/17 1600 05/06/17 1615  BP: 122/82 128/82  Pulse: 81 76  Resp: (!) 24 17  Temp:      Last Pain:  Vitals:   05/06/17 1615  TempSrc:   PainSc: Asleep                 Lynda Rainwater

## 2017-05-06 NOTE — H&P (Signed)
[name] [name] is an 56 y.o. male.   Chief Complaint: adrenal mass HPI: 56 yo male with adrenal mass that has increased in size over the last few years. 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  Past Medical History:  Diagnosis Date  . Anemia   . Anxiety   . Arthritis    neck, shoulder, left knee  . 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 Erythrocytosis 11/30/2014  . Essential hypertension    under control with med., has been on med. x 1 yr.  Marland Kitchen GERD (gastroesophageal reflux disease)   . Headache(784.0)    2 x/week  . History of kidney stones   . Hypothyroidism    hx of   . Leukocytosis, unspecified 11/03/2013  . Limited joint range of motion    cervical fusion 02/2013  . Medial meniscus tear 06/2013   left knee  . Thyroid nodule   . Tobacco abuse   . Wears partial dentures    upper    Past Surgical History:  Procedure Laterality Date  . ANTERIOR CERVICAL DECOMP/DISCECTOMY FUSION N/A 03/18/2013   Procedure: ANTERIOR CERVICAL DECOMPRESSION/DISCECTOMY FUSION 3 LEVELS;  Surgeon: Erline Levine, MD;  Location: [place] [facility];  Service: Neurosurgery;  Laterality: N/A;  Anterior Cervical Three-Six  Decompression/Diskectomy/Fusion  . APPENDECTOMY    . CIRCUMCISION  05/04/2006  . COLONOSCOPY    . EYE SURGERY     bilateral cataract surgery   . HEMICOLECTOMY Right 6/44/0347   with ileocolic anastomosis  . INGUINAL HERNIA REPAIR    . KNEE ARTHROSCOPY Left 08/01/2013   Procedure: LEFT ARTHROSCOPY KNEE;  Surgeon: Kerin Salen, MD;  Location: [place];  Service: Orthopedics;  Laterality: Left;  . SHOULDER ARTHROSCOPY W/ ROTATOR CUFF REPAIR Left 07/16/2010  . SMALL INTESTINE SURGERY  04/24/2000   adhesiolysis, ileocolic anastomosis  . UPPER GASTROINTESTINAL ENDOSCOPY      Family History  Problem Relation Age of Onset  . Cancer Father 41       colon ca - deceased.  . Colon cancer Father   . Cancer Sister 32       breast ca  . Thyroid disease Neg Hx    Social History:   reports that he has been smoking Cigarettes.  He has a 25.00 pack-year smoking history. He has never used smokeless tobacco. He reports that he drinks alcohol. He reports that he does not use drugs.  Allergies:  Allergies  Allergen Reactions  . Chantix [Varenicline] Palpitations    Heart race Increased panic attacks  . Morphine Itching  . Penicillins Rash    Has patient had a PCN reaction causing immediate rash, facial/tongue/throat swelling, SOB or lightheadedness with hypotension: Yes Has patient had a PCN reaction causing severe rash involving mucus membranes or skin necrosis: Unknown Has patient had a PCN reaction that required hospitalization: No Has patient had a PCN reaction occurring within the last 10 years: No If all of the above answers are "NO", then may proceed with Cephalosporin use.     Medications Prior to Admission  Medication Sig Dispense Refill  . acetaminophen (TYLENOL) 500 MG tablet Take 1,500 mg by mouth daily as needed for mild pain.     Marland Kitchen atorvastatin (LIPITOR) 10 MG tablet Take 10 mg by mouth daily.    . chlorthalidone (HYGROTON) 25 MG tablet Take 12.5 mg by mouth daily.     . cloNIDine (CATAPRES) 0.1 MG tablet Take 0.1 mg by mouth 2 (two) times daily.    Marland Kitchen  gabapentin (NEURONTIN) 100 MG capsule Take 100 mg by mouth 2 (two) times daily as needed (nerve pain).     Marland Kitchen ibuprofen (ADVIL,MOTRIN) 200 MG tablet Take 600 mg by mouth daily as needed for headache. Reported on 03/18/2016    . LORazepam (ATIVAN) 1 MG tablet Take 1 mg by mouth 2 (two) times daily as needed for anxiety.    . methocarbamol (ROBAXIN) 500 MG tablet Take 1 tablet (500 mg total) by mouth 2 (two) times daily. (Patient taking differently: Take 500 mg by mouth daily as needed for muscle spasms. ) 20 tablet 0  . Multiple Vitamin (MULTIVITAMIN WITH MINERALS) TABS Take 1 tablet by mouth daily.    Marland Kitchen omeprazole (PRILOSEC) 40 MG capsule Take 1 capsule (40 mg total) by mouth 2 (two) times daily. (Patient taking  differently: Take 40 mg by mouth daily. ) 60 capsule 5  . oxyCODONE-acetaminophen (PERCOCET) 10-325 MG per tablet Take 1-2 tablets by mouth every 6 (six) hours as needed for pain. 30 tablet 0  . Potassium Chloride ER 20 MEQ TBCR Take 20 mEq by mouth daily.    . sildenafil (VIAGRA) 100 MG tablet Take 100 mg by mouth daily as needed for erectile dysfunction.    . tadalafil (CIALIS) 20 MG tablet Take 20 mg by mouth daily as needed for erectile dysfunction.    . valsartan (DIOVAN) 80 MG tablet Take 80 mg by mouth daily.    Marland Kitchen labetalol (NORMODYNE) 100 MG tablet Take 0.5 tablets (50 mg total) by mouth 2 (two) times daily. (Patient not taking: Reported on 04/28/2017) 30 tablet 5    Results for orders placed or performed during the hospital encounter of 05/06/17 (from the past 48 hour(s))  Type and screen [facility]     Status: None   Collection Time: 05/06/17  9:30 AM  Result Value Ref Range   ABO/RH(D) A NEG    Antibody Screen NEG    Sample Expiration 05/09/2017   ABO/Rh     Status: None   Collection Time: 05/06/17  9:40 AM  Result Value Ref Range   ABO/RH(D) A NEG    No results found.  Review of Systems  Constitutional: Negative for chills and fever.  HENT: Negative for hearing loss.   Eyes: Negative for blurred vision and double vision.  Respiratory: Negative for cough and hemoptysis.   Cardiovascular: Negative for chest pain and palpitations.  Gastrointestinal: Negative for abdominal pain, nausea and vomiting.  Genitourinary: Negative for dysuria and urgency.  Musculoskeletal: Negative for myalgias and neck pain.  Skin: Negative for itching and rash.  Neurological: Negative for dizziness, tingling and headaches.  Endo/Heme/Allergies: Does not bruise/bleed easily.  Psychiatric/Behavioral: Negative for depression and suicidal ideas.    Blood pressure 123/80, pulse 81, temperature 98 F (36.7 C), temperature source Oral, resp. rate 18, height 5' 10.5" (1.791 m),  weight 89.4 kg (197 lb), SpO2 98 %. Physical Exam  Vitals reviewed. Constitutional: He is oriented to person, place, and time. He appears well-developed and well-nourished.  HENT:  Head: Normocephalic and atraumatic.  Eyes: Conjunctivae and EOM are normal. Pupils are equal, round, and reactive to light.  Neck: Normal range of motion. Neck supple.  Cardiovascular: Normal rate and regular rhythm.   Respiratory: Effort normal and breath sounds normal.  GI: Soft. Bowel sounds are normal. He exhibits no distension. There is no tenderness.  Musculoskeletal: Normal range of motion.  Neurological: He is alert and oriented to person, place, and time.  Skin:  Skin is warm and dry.  Psychiatric: He has a normal mood and affect. His behavior is normal.     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

## 2017-05-06 NOTE — Transfer of Care (Signed)
Immediate Anesthesia Transfer of Care Note  Patient: [name] [name]  Procedure(s) Performed: Procedure(s): OPEN RIGHT ADRENALECTOMY WITH SMALL BOWEL RESECTION AND ANASTOMOSIS (Right)  Patient Location: PACU  Anesthesia Type:General  Level of Consciousness:  sedated, patient cooperative and responds to stimulation  Airway & Oxygen Therapy:Patient Spontanous Breathing and Patient connected to face mask oxgen  Post-op Assessment:  Report given to PACU RN and Post -op Vital signs reviewed and stable  Post vital signs:  Reviewed and stable  Last Vitals:  Vitals:   05/06/17 1630 05/06/17 1645  BP: 138/86 121/87  Pulse: 80 81  Resp: 20 (!) 22  Temp:  72.1 C    Complications: No apparent anesthesia complications

## 2017-05-07 ENCOUNTER — Inpatient Hospital Stay (HOSPITAL_COMMUNITY): Payer: Medicare Other

## 2017-05-07 ENCOUNTER — Encounter (HOSPITAL_COMMUNITY): Payer: Self-pay | Admitting: General Surgery

## 2017-05-07 LAB — CBC
HCT: 36 % — ABNORMAL LOW (ref 39.0–52.0)
Hemoglobin: 12.2 g/dL — ABNORMAL LOW (ref 13.0–17.0)
MCH: 27.4 pg (ref 26.0–34.0)
MCHC: 33.9 g/dL (ref 30.0–36.0)
MCV: 80.7 fL (ref 78.0–100.0)
PLATELETS: 272 10*3/uL (ref 150–400)
RBC: 4.46 MIL/uL (ref 4.22–5.81)
RDW: 15.3 % (ref 11.5–15.5)
WBC: 29.4 10*3/uL — AB (ref 4.0–10.5)

## 2017-05-07 LAB — BASIC METABOLIC PANEL
Anion gap: 11 (ref 5–15)
BUN: 12 mg/dL (ref 6–20)
CO2: 24 mmol/L (ref 22–32)
CREATININE: 0.83 mg/dL (ref 0.61–1.24)
Calcium: 8.2 mg/dL — ABNORMAL LOW (ref 8.9–10.3)
Chloride: 103 mmol/L (ref 101–111)
Glucose, Bld: 110 mg/dL — ABNORMAL HIGH (ref 65–99)
Potassium: 3.3 mmol/L — ABNORMAL LOW (ref 3.5–5.1)
SODIUM: 138 mmol/L (ref 135–145)

## 2017-05-07 LAB — POCT I-STAT 4, (NA,K, GLUC, HGB,HCT)
Glucose, Bld: 136 mg/dL — ABNORMAL HIGH (ref 65–99)
HCT: 34 % — ABNORMAL LOW (ref 39.0–52.0)
HEMOGLOBIN: 11.6 g/dL — AB (ref 13.0–17.0)
Potassium: 3.2 mmol/L — ABNORMAL LOW (ref 3.5–5.1)
SODIUM: 139 mmol/L (ref 135–145)

## 2017-05-07 MED ORDER — GABAPENTIN 300 MG PO CAPS
300.0000 mg | ORAL_CAPSULE | Freq: Two times a day (BID) | ORAL | Status: DC
  Administered 2017-05-07 – 2017-05-09 (×5): 300 mg via ORAL
  Filled 2017-05-07 (×5): qty 1

## 2017-05-07 MED ORDER — FENTANYL 25 MCG/HR TD PT72
25.0000 ug | MEDICATED_PATCH | TRANSDERMAL | Status: DC
  Administered 2017-05-07 – 2017-05-13 (×3): 25 ug via TRANSDERMAL
  Filled 2017-05-07 (×3): qty 1

## 2017-05-07 MED ORDER — DEXTROSE 5 % IV SOLN
500.0000 mg | Freq: Four times a day (QID) | INTRAVENOUS | Status: DC | PRN
  Administered 2017-05-07 – 2017-05-09 (×4): 500 mg via INTRAVENOUS
  Filled 2017-05-07 (×3): qty 550

## 2017-05-07 MED ORDER — KETOROLAC TROMETHAMINE 30 MG/ML IJ SOLN
30.0000 mg | Freq: Three times a day (TID) | INTRAMUSCULAR | Status: DC
  Administered 2017-05-07 – 2017-05-08 (×3): 30 mg via INTRAVENOUS
  Filled 2017-05-07 (×3): qty 1

## 2017-05-07 MED ORDER — BISACODYL 5 MG PO TBEC
5.0000 mg | DELAYED_RELEASE_TABLET | Freq: Every day | ORAL | Status: DC
  Administered 2017-05-07 – 2017-05-09 (×3): 5 mg via ORAL
  Filled 2017-05-07 (×3): qty 1

## 2017-05-07 NOTE — Care Management Note (Signed)
Case Management Note  Patient Details  Name: [name] [name] MRN: [id number] Date of Birth: Dec 15, 1960  Subjective/Objective:                  Preoperative diagnosis: right adrenal mass  Postoperative diagnosis: same   Procedure: laparoscopic converted to open right adrenalectomy, small bowel resection with anastomosis, placement of 20cm^2 wound vac  Action/Plan: Date:  May 07, 2017  Chart reviewed for concurrent status and case management needs.  Will continue to follow patient progress.  Discharge Planning: following for needs  Expected discharge date: [id number]  [name], BSN, [place], [place]   Expected Discharge Date:                  Expected Discharge Plan:  Home/Self Care  In-House Referral:     Discharge planning Services  CM Consult  Post Acute Care Choice:    Choice offered to:     DME Arranged:    DME Agency:     HH Arranged:    HH Agency:     Status of Service:  In process, will continue to follow  If discussed at Long Length of Stay Meetings, dates discussed:    Additional Comments:  Leeroy Cha, RN 05/07/2017, 9:32 AM

## 2017-05-07 NOTE — Progress Notes (Signed)
Patient c/o abdominal discomfort and distention.  Bowel sounds faint.  Messaged sent to Dr. Marlou Starks for evaluation.  Increased shortness of breath noted.  See new orders.

## 2017-05-07 NOTE — Progress Notes (Signed)
Patient ID: [name] [name], male   DOB: [birth year], 56 y.o.   MRN: [id number]   Progress Note: General Surgery Service   Assessment/Plan: Patient Active Problem List   Diagnosis Date Noted  . H/O total adrenalectomy ([place]) 05/06/2017  . Right adrenal mass ([place]) 05/06/2017  . Adrenal nodule ([place]) 03/13/2017  . Throat symptom 03/18/2016  . Multinodular goiter 01/19/2016  . Hyperthyroidism 01/19/2016  . Midsternal chest pain 03/05/2015  . Essential hypertension   . Diarrhea 11/30/2014  . Anemia, iron deficiency 11/01/2014  . Leukocytosis 11/03/2013  . Smoker 07/26/2013  . COPD (chronic obstructive pulmonary disease) ([place]) 07/26/2013  . Left knee pain 07/26/2013  . Pain in joint, shoulder region 05/03/2013  . HTN (hypertension) 05/03/2013  . GERD 07/24/2008  . 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   s/p Procedure(s): OPEN RIGHT ADRENALECTOMY WITH SMALL BOWEL RESECTION AND ANASTOMOSIS 05/06/2017 Pain issues overnight Hgb stable, leukomoid reaction after surgery -scheduled toradol -change gabapentin to scheduled -add fentanyl patch -transfer to floor -remove a line -remove foley -increase IS use -goal for up to chair this afternoon -continue clear liquids   LOS: 1 day  Chief Complaint/Subjective: Pain in right side is awful, some bloating, no nausea or vomiting  Objective: Vital signs in last 24 hours: Temp:  [97.6 F (36.4 C)-100.1 F (37.8 C)] 98.5 F (36.9 C) (07/12 0800) Pulse Rate:  [76-91] 81 (07/12 0500) Resp:  [14-31] 22 (07/12 0800) BP: (121-149)/(68-110) 149/79 (07/12 0500) SpO2:  [89 %-98 %] 98 % (07/12 0800) Arterial Line BP: (106-143)/(56-78) 112/57 (07/12 0500) Weight:  [89.4 kg (197 lb)-92.3 kg (203 lb 7.8 oz)] 92.3 kg (203 lb 7.8 oz) (07/11 1715) Last BM Date: 05/03/17 (per patient)  Intake/Output from previous day: 07/11 0701 - 07/12 0700 In: 4365.9 [I.V.:3865.9; IV Piggyback:500] Out: 2475 [Urine:1975; Drains:100; Blood:400] Intake/Output  this shift: Total I/O In: 225 [I.V.:225] Out: -   Lungs: CTAB  Cardiovascular: RRR  Abd: soft, ATTP, ND, some numbness medial to incision  Extremities: no edema  Neuro: AOx4  Lab Results: CBC   Recent Labs  05/06/17 1742 05/07/17 0314  WBC 21.7* 29.4*  HGB 12.2* 12.2*  HCT 35.8* 36.0*  PLT 315 272   BMET  Recent Labs  05/04/17 1018 05/06/17 1407 05/06/17 1742 05/07/17 0314  NA 141 139  --  138  K 3.4* 3.2*  --  3.3*  CL 103  --   --  103  CO2 26  --   --  24  GLUCOSE 105* 136*  --  110*  BUN 11  --   --  12  CREATININE 0.81  --  0.79 0.83  CALCIUM 9.6  --   --  8.2*   PT/INR  Recent Labs  05/04/17 1018  LABPROT 13.2  INR 1.00   ABG No results for input(s): PHART, HCO3 in the last 72 hours.  Invalid input(s): PCO2, PO2  Studies/Results:  Anti-infectives: Anti-infectives    Start     Dose/Rate Route Frequency Ordered Stop   05/06/17 0916  vancomycin (VANCOCIN) IVPB 1000 mg/200 mL premix     1,000 mg 200 mL/hr over 60 Minutes Intravenous On call to O.R. 05/06/17 0916 05/06/17 1229      Medications: Scheduled Meds: . atorvastatin  10 mg Oral q1800  . enoxaparin (LOVENOX) injection  40 mg Subcutaneous Q24H  . HYDROmorphone   Intravenous Q4H  . irbesartan  37.5 mg Oral Daily  . labetalol  50 mg Oral BID  . methocarbamol  500 mg Oral BID  . multivitamin with minerals  1 tablet Oral Daily  . nicotine  21 mg Transdermal Daily  . pantoprazole  40 mg Oral Daily  . potassium chloride SA  20 mEq Oral Daily   Continuous Infusions: . sodium chloride    . sodium chloride 75 mL/hr at 05/07/17 0800  . sodium chloride    . nitroGLYCERIN    . nitroPRUSSide Stopped (05/06/17 1427)  . phenylephrine (NEO-SYNEPHRINE) Adult infusion     PRN Meds:.Place/Maintain arterial line **AND** sodium chloride, acetaminophen **OR** acetaminophen, bisacodyl, diphenhydrAMINE **OR** diphenhydrAMINE, gabapentin, HYDROmorphone (DILAUDID) injection, LORazepam, naloxone  **AND** sodium chloride flush, ondansetron **OR** ondansetron (ZOFRAN) IV, oxyCODONE, simethicone  Mickeal Skinner, MD Pg# [phone] [facility]

## 2017-05-07 NOTE — Progress Notes (Signed)
[place], RN and [place], RN wasted 10 cc of Morphine PCA at 2025 on 05/06/17.

## 2017-05-07 NOTE — Anesthesia Postprocedure Evaluation (Signed)
Anesthesia Post Note  Patient: [name] [name]  Procedure(s) Performed: Procedure(s) (LRB): OPEN RIGHT ADRENALECTOMY WITH SMALL BOWEL RESECTION AND ANASTOMOSIS (Right)     Patient location during evaluation: PACU Anesthesia Type: General Level of consciousness: awake and alert Pain management: pain level controlled Vital Signs Assessment: post-procedure vital signs reviewed and stable Respiratory status: spontaneous breathing, nonlabored ventilation and respiratory function stable Cardiovascular status: blood pressure returned to baseline and stable Postop Assessment: no signs of nausea or vomiting Anesthetic complications: no    Last Vitals:  Vitals:   05/07/17 0400 05/07/17 0500  BP: 139/71 (!) 149/79  Pulse: 84 81  Resp: 16 19  Temp: 37.8 C     Last Pain:  Vitals:   05/07/17 0609  TempSrc:   PainSc: Advance

## 2017-05-08 LAB — CBC
HCT: 32.3 % — ABNORMAL LOW (ref 39.0–52.0)
Hemoglobin: 11.1 g/dL — ABNORMAL LOW (ref 13.0–17.0)
MCH: 27.3 pg (ref 26.0–34.0)
MCHC: 34.4 g/dL (ref 30.0–36.0)
MCV: 79.6 fL (ref 78.0–100.0)
PLATELETS: 219 10*3/uL (ref 150–400)
RBC: 4.06 MIL/uL — AB (ref 4.22–5.81)
RDW: 15 % (ref 11.5–15.5)
WBC: 19.6 10*3/uL — AB (ref 4.0–10.5)

## 2017-05-08 LAB — BASIC METABOLIC PANEL
Anion gap: 9 (ref 5–15)
BUN: 15 mg/dL (ref 6–20)
CO2: 23 mmol/L (ref 22–32)
Calcium: 7.5 mg/dL — ABNORMAL LOW (ref 8.9–10.3)
Chloride: 101 mmol/L (ref 101–111)
Creatinine, Ser: 0.95 mg/dL (ref 0.61–1.24)
GFR calc Af Amer: >60 mL/min (ref >60)
Glucose, Bld: 104 mg/dL — ABNORMAL HIGH (ref 65–99)
POTASSIUM: 3.1 mmol/L — AB (ref 3.5–5.1)
SODIUM: 133 mmol/L — AB (ref 135–145)

## 2017-05-08 MED ORDER — KETOROLAC TROMETHAMINE 30 MG/ML IJ SOLN
15.0000 mg | Freq: Three times a day (TID) | INTRAMUSCULAR | Status: DC
  Administered 2017-05-08 – 2017-05-09 (×4): 15 mg via INTRAVENOUS
  Filled 2017-05-08 (×4): qty 1

## 2017-05-08 MED ORDER — SODIUM CHLORIDE 0.9 % IV BOLUS (SEPSIS)
1000.0000 mL | Freq: Once | INTRAVENOUS | Status: AC
  Administered 2017-05-08: 1000 mL via INTRAVENOUS

## 2017-05-08 NOTE — Consult Note (Signed)
[place] Nurse wound consult note Reason for Consult: NPWT dressing, first postop. Surgery has seen patient already this am. Noted 1pc of black foam reported from surgery Wound type: full thickness surgical wound, right lateral chest Pressure Injury POA:NA Measurement:19cm x 3cm x 1cm  Wound bed:100% clean, pink Drainage (amount, consistency, odor) minimal in canister, serosanguinous  Periwound: intact some medical adhesive skin injury noted along the medial aspect of the VAC drape. Dressing procedure/placement/frequency: 1pc of black foam used to cover wound bed. Seal at 155mmHG. Patient used PCA 3 x during dressing change. Protected skin with skin barrier wipes due to the presence of MARSI along the medial aspect of the VAC drape. Next dressing due Monday 05/11/17, ok for bedside nurses to change uncomplicated NPWT dressing.   Discussed POC with patient and bedside nurse.  Re consult if needed, will not follow at this time. Thanks  Claribel Sachs [employer], RN,CWOCN, CNS, [place] [phone])

## 2017-05-09 ENCOUNTER — Inpatient Hospital Stay (HOSPITAL_COMMUNITY): Payer: Medicare Other

## 2017-05-09 DIAGNOSIS — K660 Peritoneal adhesions (postprocedural) (postinfection): Secondary | ICD-10-CM

## 2017-05-09 LAB — CBC
HCT: 29.3 % — ABNORMAL LOW (ref 39.0–52.0)
HEMOGLOBIN: 10.3 g/dL — AB (ref 13.0–17.0)
MCH: 27.7 pg (ref 26.0–34.0)
MCHC: 35.2 g/dL (ref 30.0–36.0)
MCV: 78.8 fL (ref 78.0–100.0)
Platelets: 217 10*3/uL (ref 150–400)
RBC: 3.72 MIL/uL — AB (ref 4.22–5.81)
RDW: 14.8 % (ref 11.5–15.5)
WBC: 18.8 10*3/uL — ABNORMAL HIGH (ref 4.0–10.5)

## 2017-05-09 LAB — BASIC METABOLIC PANEL
Anion gap: 11 (ref 5–15)
BUN: 12 mg/dL (ref 6–20)
CHLORIDE: 101 mmol/L (ref 101–111)
CO2: 24 mmol/L (ref 22–32)
CREATININE: 1.02 mg/dL (ref 0.61–1.24)
Calcium: 7.2 mg/dL — ABNORMAL LOW (ref 8.9–10.3)
GFR calc non Af Amer: >60 mL/min (ref >60)
Glucose, Bld: 106 mg/dL — ABNORMAL HIGH (ref 65–99)
Potassium: 3.4 mmol/L — ABNORMAL LOW (ref 3.5–5.1)
SODIUM: 136 mmol/L (ref 135–145)

## 2017-05-09 MED ORDER — CLONIDINE HCL 0.1 MG PO TABS
0.1000 mg | ORAL_TABLET | Freq: Two times a day (BID) | ORAL | Status: DC
  Administered 2017-05-09 – 2017-05-10 (×3): 0.1 mg via ORAL
  Filled 2017-05-09 (×3): qty 1

## 2017-05-09 MED ORDER — LACTATED RINGERS IV BOLUS (SEPSIS): 1000.0000 mL | Freq: Three times a day (TID) | INTRAVENOUS | Status: AC | PRN

## 2017-05-09 MED ORDER — METHOCARBAMOL 1000 MG/10ML IJ SOLN
750.0000 mg | Freq: Three times a day (TID) | INTRAVENOUS | Status: DC
  Filled 2017-05-09: qty 7.5

## 2017-05-09 MED ORDER — LIP MEDEX EX OINT
1.0000 "application " | TOPICAL_OINTMENT | Freq: Two times a day (BID) | CUTANEOUS | Status: DC
  Administered 2017-05-10 – 2017-07-26 (×148): 1 via TOPICAL
  Filled 2017-05-09 (×13): qty 7

## 2017-05-09 MED ORDER — GUAIFENESIN-DM 100-10 MG/5ML PO SYRP: 10.0000 mL | ORAL_SOLUTION | ORAL | Status: DC | PRN

## 2017-05-09 MED ORDER — ALUM & MAG HYDROXIDE-SIMETH 200-200-20 MG/5ML PO SUSP
30.0000 mL | Freq: Four times a day (QID) | ORAL | Status: DC | PRN
  Administered 2017-05-11 – 2017-05-13 (×4): 30 mL via ORAL
  Filled 2017-05-09 (×4): qty 30

## 2017-05-09 MED ORDER — METHOCARBAMOL 1000 MG/10ML IJ SOLN
750.0000 mg | Freq: Three times a day (TID) | INTRAVENOUS | Status: DC
  Administered 2017-05-09 – 2017-05-10 (×2): 750 mg via INTRAVENOUS
  Filled 2017-05-09 (×3): qty 7.5

## 2017-05-09 MED ORDER — PANTOPRAZOLE SODIUM 40 MG PO TBEC
40.0000 mg | DELAYED_RELEASE_TABLET | Freq: Two times a day (BID) | ORAL | Status: DC
  Administered 2017-05-10 – 2017-05-13 (×6): 40 mg via ORAL
  Filled 2017-05-09 (×6): qty 1

## 2017-05-09 MED ORDER — OXYCODONE-ACETAMINOPHEN 5-325 MG PO TABS
1.0000 | ORAL_TABLET | ORAL | Status: DC | PRN
  Administered 2017-05-09: 2 via ORAL
  Filled 2017-05-09: qty 2

## 2017-05-09 MED ORDER — BISACODYL 10 MG RE SUPP
10.0000 mg | Freq: Every day | RECTAL | Status: DC
  Administered 2017-05-10 – 2017-05-28 (×11): 10 mg via RECTAL
  Filled 2017-05-09 (×14): qty 1

## 2017-05-09 MED ORDER — IBUPROFEN 200 MG PO TABS: 600.0000 mg | ORAL_TABLET | Freq: Every day | ORAL | Status: DC | PRN

## 2017-05-09 MED ORDER — MAGIC MOUTHWASH
15.0000 mL | Freq: Four times a day (QID) | ORAL | Status: DC | PRN
  Administered 2017-06-07 – 2017-06-20 (×10): 15 mL via ORAL
  Filled 2017-05-09 (×11): qty 15

## 2017-05-09 MED ORDER — ALBUTEROL SULFATE (2.5 MG/3ML) 0.083% IN NEBU
2.5000 mg | INHALATION_SOLUTION | RESPIRATORY_TRACT | Status: DC | PRN
  Administered 2017-05-10 – 2017-05-13 (×5): 2.5 mg via RESPIRATORY_TRACT
  Filled 2017-05-09 (×6): qty 3

## 2017-05-09 MED ORDER — PROCHLORPERAZINE EDISYLATE 5 MG/ML IJ SOLN: 5.0000 mg | INTRAMUSCULAR | Status: DC | PRN

## 2017-05-09 MED ORDER — OXYCODONE HCL 5 MG PO TABS: 5.0000 mg | ORAL_TABLET | ORAL | Status: DC | PRN

## 2017-05-09 MED ORDER — PHENOL 1.4 % MT LIQD: 1.0000 | OROMUCOSAL | Status: DC | PRN

## 2017-05-09 MED ORDER — FUROSEMIDE 10 MG/ML IJ SOLN
20.0000 mg | Freq: Once | INTRAMUSCULAR | Status: AC
  Administered 2017-05-09: 20 mg via INTRAVENOUS
  Filled 2017-05-09: qty 2

## 2017-05-09 MED ORDER — MENTHOL 3 MG MT LOZG: 1.0000 | LOZENGE | OROMUCOSAL | Status: DC | PRN

## 2017-05-09 MED ORDER — METOCLOPRAMIDE HCL 5 MG/ML IJ SOLN: 5.0000 mg | Freq: Four times a day (QID) | INTRAMUSCULAR | Status: DC | PRN

## 2017-05-09 MED ORDER — PSYLLIUM 95 % PO PACK: 1.0000 | PACK | Freq: Every day | ORAL | Status: DC

## 2017-05-09 MED ORDER — KCL IN DEXTROSE-NACL 20-5-0.45 MEQ/L-%-% IV SOLN
INTRAVENOUS | Status: AC
  Administered 2017-05-09: 12:00:00 via INTRAVENOUS
  Administered 2017-05-10: 1000 mL via INTRAVENOUS
  Administered 2017-05-11 – 2017-05-13 (×3): via INTRAVENOUS
  Administered 2017-05-14: 100 mL via INTRAVENOUS
  Administered 2017-05-14 – 2017-05-15 (×2): via INTRAVENOUS
  Filled 2017-05-09 (×11): qty 1000

## 2017-05-09 MED ORDER — CHLORTHALIDONE 25 MG PO TABS
12.5000 mg | ORAL_TABLET | Freq: Every day | ORAL | Status: DC
  Filled 2017-05-09 (×2): qty 0.5

## 2017-05-09 MED ORDER — HYDROCORTISONE 2.5 % RE CREA: 1.0000 "application " | TOPICAL_CREAM | Freq: Four times a day (QID) | RECTAL | Status: DC | PRN

## 2017-05-09 MED ORDER — ACETAMINOPHEN 500 MG PO TABS
1000.0000 mg | ORAL_TABLET | Freq: Three times a day (TID) | ORAL | Status: DC
  Administered 2017-05-09: 1000 mg via ORAL
  Filled 2017-05-09: qty 2

## 2017-05-09 MED ORDER — ENSURE SURGERY PO LIQD
237.0000 mL | Freq: Two times a day (BID) | ORAL | Status: DC
  Administered 2017-05-11 – 2017-05-12 (×4): 237 mL via ORAL
  Filled 2017-05-09 (×9): qty 237

## 2017-05-09 MED ORDER — SACCHAROMYCES BOULARDII 250 MG PO CAPS
250.0000 mg | ORAL_CAPSULE | Freq: Two times a day (BID) | ORAL | Status: DC
  Administered 2017-05-10 – 2017-05-12 (×4): 250 mg via ORAL
  Filled 2017-05-09 (×5): qty 1

## 2017-05-09 MED ORDER — HYDROMORPHONE HCL-NACL 0.5-0.9 MG/ML-% IV SOSY
0.5000 mg | PREFILLED_SYRINGE | INTRAVENOUS | Status: DC | PRN
  Administered 2017-05-10 (×10): 2 mg via INTRAVENOUS
  Administered 2017-05-11: 0.5 mg via INTRAVENOUS
  Administered 2017-05-11 – 2017-05-13 (×18): 2 mg via INTRAVENOUS
  Administered 2017-05-13: 1 mg via INTRAVENOUS
  Administered 2017-05-13: 1.5 mg via INTRAVENOUS
  Administered 2017-05-13 (×5): 2 mg via INTRAVENOUS
  Filled 2017-05-09 (×4): qty 4
  Filled 2017-05-09: qty 2
  Filled 2017-05-09 (×4): qty 4
  Filled 2017-05-09: qty 2
  Filled 2017-05-09: qty 4
  Filled 2017-05-09: qty 2
  Filled 2017-05-09 (×24): qty 4

## 2017-05-09 MED ORDER — CHLORPROMAZINE HCL 25 MG/ML IJ SOLN
25.0000 mg | Freq: Four times a day (QID) | INTRAMUSCULAR | Status: DC | PRN
  Filled 2017-05-09: qty 1

## 2017-05-09 MED ORDER — HYDROCORTISONE 1 % EX CREA: 1.0000 "application " | TOPICAL_CREAM | Freq: Three times a day (TID) | CUTANEOUS | Status: DC | PRN

## 2017-05-09 MED ORDER — BISACODYL 10 MG RE SUPP: 10.0000 mg | Freq: Two times a day (BID) | RECTAL | Status: DC | PRN

## 2017-05-09 MED ORDER — METOPROLOL TARTRATE 5 MG/5ML IV SOLN: 5.0000 mg | Freq: Four times a day (QID) | INTRAVENOUS | Status: DC | PRN

## 2017-05-09 NOTE — Progress Notes (Signed)
[facility]  [address] [place], [place] [address] Phone: [phone]  FAX: [phone]      [name] [name] [id number] 1961-10-04  CARE TEAM:  PCP: Lujean Amel, MD  Outpatient Care Team: Patient Care Team: Lujean Amel, MD as PCP - General (Family Medicine) Heath Lark, MD as Consulting Physician (Hematology and Oncology)  Inpatient Treatment Team: Treatment Team: Attending Provider: [name], Arta Bruce, MD; Registered Nurse: Nadara Mode, RN; Technician: Sueanne Margarita, NT; Registered Nurse: Consuela Mimes, RN   Problem List:   Principal Problem:   Right adrenal mass s/p adrenalectomy 05/06/2017 Active Problems:   GERD   Smoker   COPD (chronic obstructive pulmonary disease) ([place])   Essential hypertension   H/O total adrenalectomy ([place])   Intra-abdominal adhesions s/p SB resection 05/06/2017   3 Days Post-Op  05/06/2017  Procedure(s): OPEN RIGHT ADRENALECTOMY WITH SMALL BOWEL RESECTION AND ANASTOMOSIS   Assessment  Fair  Plan:  -DC PCA and switched to intermittent Dilaudid.  Continue scheduled Robaxin/eyes/Tylenol/Toradol.  Challenge in this patient on chronic narcotics.  Anxiolysis.  -Nicotine patch for withdrawal  -Beta blocker and clonidine.  Make sure he is not getting rebound hypertension.  Blood pressure K for now.  Attempt diuresis 1 since exploration shows some edema and atelectasis.  Most likely cause of temperature spike..  IV fluid back up if urine output goes down.  Keep on low volume clears until ileus resolves.  If much worse, place nasogastric tube.  If worse, consider CAT scan.  No strong evidence of peritonitis or leak at this time.  -VTE prophylaxis- SCDs, etc  -mobilize as tolerated to help recovery  30 minutes spent in review, evaluation, examination, counseling, and coordination of care.  More than 50% of that time was spent in counseling.  I updated the patient's status to  the patient, spouse, and nurse.  Recommendations were made.  Questions were answered.  They expressed understanding & appreciation.   Adin Hector, M.D., F.A.C.S. Gastrointestinal and Minimally Invasive Surgery [facility] [address], [place], [place] [address] [phone] Main / Paging   05/09/2017    Subjective: (Chief complaint)  Patient felt pain while trying to get up and walking tonight.  Heart rate went up.  Hopefully more sore and anxious.  At extra doses of Dilaudid.  Less sore but cannot get comfortable.  Tolerating intermittent sepsis of liquids.  No nausea but has some abdominal crampiness. Claim small flatus and small bowel movement today. Sore.  Sometimes forgets to press the PCA button.   sometimes using it regularly.  Usually not more than an hour  Spouse and nurse in room.  Objective:  Vital signs:  Vitals:   05/09/17 1331 05/09/17 1508 05/09/17 2000 05/09/17 2222  BP: 113/62   123/70  Pulse: 92   (!) 121  Resp: 20 (!) 21 15 (!) 30  Temp: 98.2 F (36.8 C)   (!) 102.7 F (39.3 C)  TempSrc: Oral   Axillary  SpO2: 93% 93% 90% 90%  Weight:      Height:        Last BM Date: 05/08/17  Intake/Output   Yesterday:  07/13 0701 - 07/14 0700 In: 855.1 [P.O.:240; I.V.:615.1] Out: 450 [Urine:450] This shift:  No intake/output data recorded.  Bowel function:  Flatus: YES  BM:  YES  Drain: (No drain).  Wound VAC with old blood.   Physical Exam:  General: Pt awake/alert/oriented x4 in moderate  acute distress.  Uncomfortable.  Consolable. Eyes: PERRL, normal EOM.  Sclera clear.  No icterus Neuro: CN II-XII intact w/o focal sensory/motor deficits. Lymph: No head/neck/groin lymphadenopathy Psych:  No delerium/psychosis/paranoia HENT: Normocephalic, Mucus membranes moist.  No thrush Neck: Supple, No tracheal deviation Chest: No chest wall pain w good excursion CV:  Pulses intact.  Regular rhythm MS: Normal AROM  mjr joints.  No obvious deformity  Abdomen: Somewhat firm.  Moderately distended.  Mildly tender at incisions only.  Right upper quadrant incision clean.  Wound VAC with serosanguineous drainage.  No bile.  No stool. No evidence of peritonitis.  No incarcerated hernias.  Ext:  No deformity.  1-2+ edema.  No cyanosis Skin: No petechiae / purpura  Results:   Labs: Results for orders placed or performed during the hospital encounter of 05/06/17 (from the past 48 hour(s))  CBC     Status: Abnormal   Collection Time: 05/08/17  5:00 AM  Result Value Ref Range   WBC 19.6 (H) 4.0 - 10.5 K/uL   RBC 4.06 (L) 4.22 - 5.81 MIL/uL   Hemoglobin 11.1 (L) 13.0 - 17.0 g/dL   HCT 32.3 (L) 39.0 - 52.0 %   MCV 79.6 78.0 - 100.0 fL   MCH 27.3 26.0 - 34.0 pg   MCHC 34.4 30.0 - 36.0 g/dL   RDW 15.0 11.5 - 15.5 %   Platelets 219 150 - 400 K/uL  Basic metabolic panel     Status: Abnormal   Collection Time: 05/08/17  5:00 AM  Result Value Ref Range   Sodium 133 (L) 135 - 145 mmol/L   Potassium 3.1 (L) 3.5 - 5.1 mmol/L   Chloride 101 101 - 111 mmol/L   CO2 23 22 - 32 mmol/L   Glucose, Bld 104 (H) 65 - 99 mg/dL   BUN 15 6 - 20 mg/dL   Creatinine, Ser 0.95 0.61 - 1.24 mg/dL   Calcium 7.5 (L) 8.9 - 10.3 mg/dL   GFR calc non Af Amer >60 >60 mL/min   GFR calc Af Amer >60 >60 mL/min    Comment: (NOTE) The eGFR has been calculated using the CKD EPI equation. This calculation has not been validated in all clinical situations. eGFR's persistently <60 mL/min signify possible Chronic Kidney Disease.    Anion gap 9 5 - 15  CBC     Status: Abnormal   Collection Time: 05/09/17  5:09 AM  Result Value Ref Range   WBC 18.8 (H) 4.0 - 10.5 K/uL   RBC 3.72 (L) 4.22 - 5.81 MIL/uL   Hemoglobin 10.3 (L) 13.0 - 17.0 g/dL   HCT 29.3 (L) 39.0 - 52.0 %   MCV 78.8 78.0 - 100.0 fL   MCH 27.7 26.0 - 34.0 pg   MCHC 35.2 30.0 - 36.0 g/dL   RDW 14.8 11.5 - 15.5 %   Platelets 217 150 - 400 K/uL  Basic metabolic panel      Status: Abnormal   Collection Time: 05/09/17  5:09 AM  Result Value Ref Range   Sodium 136 135 - 145 mmol/L   Potassium 3.4 (L) 3.5 - 5.1 mmol/L   Chloride 101 101 - 111 mmol/L   CO2 24 22 - 32 mmol/L   Glucose, Bld 106 (H) 65 - 99 mg/dL   BUN 12 6 - 20 mg/dL   Creatinine, Ser 1.02 0.61 - 1.24 mg/dL   Calcium 7.2 (L) 8.9 - 10.3 mg/dL   GFR calc non Af Amer >60 >60 mL/min  GFR calc Af Amer >60 >60 mL/min    Comment: (NOTE) The eGFR has been calculated using the CKD EPI equation. This calculation has not been validated in all clinical situations. eGFR's persistently <60 mL/min signify possible Chronic Kidney Disease.    Anion gap 11 5 - 15    Imaging / Studies: No results found.  Medications / Allergies: per chart  Antibiotics: Anti-infectives    Start     Dose/Rate Route Frequency Ordered Stop   05/06/17 0916  vancomycin (VANCOCIN) IVPB 1000 mg/200 mL premix     1,000 mg 200 mL/hr over 60 Minutes Intravenous On call to O.R. 05/06/17 0916 05/06/17 1229        Note: Portions of this report may have been transcribed using voice recognition software. Every effort was made to ensure accuracy; however, inadvertent computerized transcription errors may be present.   Any transcriptional errors that result from this process are unintentional.     Adin Hector, M.D., F.A.C.S. Gastrointestinal and Minimally Invasive Surgery [facility] [address] [place], [place] [address] [phone] Main / Paging   05/09/2017

## 2017-05-09 NOTE — Progress Notes (Signed)
[facility] Office:  [phone] General Surgery Progress Note   LOS: 3 days  POD -  3 Days Post-Op  Chief Complaint: Adrenal tumor  Assessment and Plan: 1.  OPEN RIGHT ADRENALECTOMY WITH SMALL BOWEL RESECTION AND ANASTOMOSIS - 05/06/2017 - [name]  WBC - 18,800 - 05/09/2017  Has had BM, but moving slowly  Plan:  Will keep on clear liquids for now, restart his Percocet - he takes 40 to 50 mg daily at home, encourage ambulation  2.  HTN 3.  COPD/Smokes 4.  Hypokalemia -   K+ - 3.4 - 05/09/2017   On oral K+, will change IVF to add K+ 5.  On chronic pain meds  Takes about 40 mg oxycodone per day for neck - goes to pain clinic - sees Dr. [name] for neck  Will restart percocet 6.  DVT prophylaxis - Lovenox   Active Problems:   H/O total adrenalectomy ([name])   Right adrenal mass ([facility])   Subjective:  Still with pain issues.  But according to wife, these are better.  We discussed his chronic narcotic use and the difficulty of managing pain post op.  Wife in room.  Objective:   Vitals:   05/09/17 0610 05/09/17 0800  BP:    Pulse:    Resp: 11 (!) 24  Temp:       Intake/Output from previous day:  07/13 0701 - 07/14 0700 In: 855.1 [P.O.:240; I.V.:615.1] Out: 450 [Urine:450]  Intake/Output this shift:  No intake/output data recorded.   Physical Exam:   General: WN M who is alert and oriented.    HEENT: Normal. Pupils equal. .   Lungs: Clear.  IS=1,000   Abdomen: Mild distention.  Rare BS.   Wound: VAC in RUQ.   Lab Results:    Recent Labs  05/08/17 0500 05/09/17 0509  WBC 19.6* 18.8*  HGB 11.1* 10.3*  HCT 32.3* 29.3*  PLT 219 217    BMET   Recent Labs  05/08/17 0500 05/09/17 0509  NA 133* 136  K 3.1* 3.4*  CL 101 101  CO2 23 24  GLUCOSE 104* 106*  BUN 15 12  CREATININE 0.95 1.02  CALCIUM 7.5* 7.2*    PT/INR  No results for input(s): LABPROT, INR in the last 72 hours.  ABG  No results for input(s): PHART, HCO3 in the last 72  hours.  Invalid input(s): PCO2, PO2   Studies/Results:  Dg Chest Port 1 View  Result Date: 05/07/2017 CLINICAL DATA:  Rapid respiration. Abdominal distension and shortness of breath. Laparoscopic adrenalectomy and small bowel resection with reanastomosis May 06, 2017. EXAM: PORTABLE CHEST 1 VIEW COMPARISON:  Mar 05, 2015 FINDINGS: Free air seen under the right hemidiaphragm consistent with surgery yesterday. No pneumothorax. The cardiomediastinal silhouette is normal. Bibasilar opacities are likely atelectasis. IMPRESSION: 1. Free air under the right hemidiaphragm consistent with surgery yesterday. 2. Bibasilar opacities, likely atelectasis. Electronically Signed   By: Dorise Bullion III M.D   On: 05/07/2017 21:04     Anti-infectives:   Anti-infectives    Start     Dose/Rate Route Frequency Ordered Stop   05/06/17 0916  vancomycin (VANCOCIN) IVPB 1000 mg/200 mL premix     1,000 mg 200 mL/hr over 60 Minutes Intravenous On call to O.R. 05/06/17 0916 05/06/17 1229      Alphonsa Overall, MD, FACS Pager: [facility] Office: [phone] 05/09/2017

## 2017-05-10 ENCOUNTER — Encounter (HOSPITAL_COMMUNITY): Payer: Self-pay | Admitting: Surgery

## 2017-05-10 DIAGNOSIS — F411 Generalized anxiety disorder: Secondary | ICD-10-CM | POA: Insufficient documentation

## 2017-05-10 DIAGNOSIS — F1190 Opioid use, unspecified, uncomplicated: Secondary | ICD-10-CM

## 2017-05-10 LAB — CBC
HEMATOCRIT: 29.7 % — AB (ref 39.0–52.0)
HEMOGLOBIN: 10.2 g/dL — AB (ref 13.0–17.0)
MCH: 27.6 pg (ref 26.0–34.0)
MCHC: 34.3 g/dL (ref 30.0–36.0)
MCV: 80.3 fL (ref 78.0–100.0)
Platelets: 258 10*3/uL (ref 150–400)
RBC: 3.7 MIL/uL — AB (ref 4.22–5.81)
RDW: 14.7 % (ref 11.5–15.5)
WBC: 6.4 10*3/uL (ref 4.0–10.5)

## 2017-05-10 LAB — TYPE AND SCREEN
ABO/RH(D): A NEG
Antibody Screen: NEGATIVE
UNIT DIVISION: 0
UNIT DIVISION: 0

## 2017-05-10 LAB — BASIC METABOLIC PANEL
Anion gap: 10 (ref 5–15)
BUN: 20 mg/dL (ref 6–20)
CHLORIDE: 104 mmol/L (ref 101–111)
CO2: 23 mmol/L (ref 22–32)
CREATININE: 1.39 mg/dL — AB (ref 0.61–1.24)
Calcium: 7.6 mg/dL — ABNORMAL LOW (ref 8.9–10.3)
GFR calc non Af Amer: 55 mL/min — ABNORMAL LOW (ref >60)
Glucose, Bld: 123 mg/dL — ABNORMAL HIGH (ref 65–99)
POTASSIUM: 3.6 mmol/L (ref 3.5–5.1)
Sodium: 137 mmol/L (ref 135–145)

## 2017-05-10 LAB — BPAM RBC
BLOOD PRODUCT EXPIRATION DATE: 201807262359
Blood Product Expiration Date: 201807272359
UNIT TYPE AND RH: 600
Unit Type and Rh: 600

## 2017-05-10 LAB — TSH: TSH: 0.512 u[IU]/mL (ref 0.350–4.500)

## 2017-05-10 LAB — T4, FREE: Free T4: 1.13 ng/dL — ABNORMAL HIGH (ref 0.61–1.12)

## 2017-05-10 MED ORDER — LORAZEPAM 1 MG PO TABS
1.0000 mg | ORAL_TABLET | Freq: Four times a day (QID) | ORAL | Status: DC | PRN
  Administered 2017-05-10 – 2017-05-12 (×3): 1 mg via ORAL
  Filled 2017-05-10 (×3): qty 1

## 2017-05-10 MED ORDER — OXYCODONE HCL 5 MG PO TABS
10.0000 mg | ORAL_TABLET | ORAL | Status: DC | PRN
  Administered 2017-05-10 – 2017-05-12 (×5): 15 mg via ORAL
  Administered 2017-05-13: 10 mg via ORAL
  Administered 2017-05-13: 15 mg via ORAL
  Filled 2017-05-10 (×2): qty 3
  Filled 2017-05-10: qty 2
  Filled 2017-05-10 (×4): qty 3

## 2017-05-10 MED ORDER — FOLIC ACID 1 MG PO TABS
1.0000 mg | ORAL_TABLET | Freq: Every day | ORAL | Status: DC
  Administered 2017-05-10 – 2017-05-11 (×2): 1 mg via ORAL
  Filled 2017-05-10 (×3): qty 1

## 2017-05-10 MED ORDER — POLYETHYLENE GLYCOL 3350 17 G PO PACK
17.0000 g | PACK | Freq: Two times a day (BID) | ORAL | Status: DC
  Administered 2017-05-10 – 2017-05-12 (×3): 17 g via ORAL
  Filled 2017-05-10 (×5): qty 1

## 2017-05-10 MED ORDER — POLYETHYLENE GLYCOL 3350 17 G PO PACK: 17.0000 g | PACK | Freq: Two times a day (BID) | ORAL | Status: DC | PRN

## 2017-05-10 MED ORDER — GABAPENTIN 300 MG PO CAPS
300.0000 mg | ORAL_CAPSULE | Freq: Three times a day (TID) | ORAL | Status: DC
  Administered 2017-05-10 – 2017-05-13 (×8): 300 mg via ORAL
  Filled 2017-05-10 (×3): qty 1
  Filled 2017-05-10: qty 3
  Filled 2017-05-10 (×5): qty 1

## 2017-05-10 MED ORDER — THIAMINE HCL 100 MG/ML IJ SOLN: 100.0000 mg | Freq: Every day | INTRAMUSCULAR | Status: DC

## 2017-05-10 MED ORDER — LORAZEPAM 2 MG/ML IJ SOLN
1.0000 mg | Freq: Four times a day (QID) | INTRAMUSCULAR | Status: DC | PRN
  Administered 2017-05-10 – 2017-05-11 (×2): 1 mg via INTRAVENOUS
  Filled 2017-05-10 (×2): qty 1

## 2017-05-10 MED ORDER — SIMETHICONE 80 MG PO CHEW
40.0000 mg | CHEWABLE_TABLET | Freq: Four times a day (QID) | ORAL | Status: AC
Start: 2017-05-10 — End: 2017-05-12
  Administered 2017-05-10 – 2017-05-11 (×7): 40 mg via ORAL
  Filled 2017-05-10 (×7): qty 1

## 2017-05-10 MED ORDER — LACTATED RINGERS IV BOLUS (SEPSIS): 1000.0000 mL | Freq: Three times a day (TID) | INTRAVENOUS | Status: AC | PRN

## 2017-05-10 MED ORDER — ADULT MULTIVITAMIN W/MINERALS CH
1.0000 | ORAL_TABLET | Freq: Every day | ORAL | Status: DC
  Administered 2017-05-10 – 2017-05-11 (×2): 1 via ORAL
  Filled 2017-05-10 (×3): qty 1

## 2017-05-10 MED ORDER — ACETAMINOPHEN 500 MG PO TABS
1000.0000 mg | ORAL_TABLET | Freq: Four times a day (QID) | ORAL | Status: DC
  Administered 2017-05-10 – 2017-05-12 (×7): 1000 mg via ORAL
  Filled 2017-05-10 (×10): qty 2

## 2017-05-10 MED ORDER — VITAMIN B-1 100 MG PO TABS
100.0000 mg | ORAL_TABLET | Freq: Every day | ORAL | Status: DC
  Administered 2017-05-10 – 2017-05-16 (×4): 100 mg via ORAL
  Filled 2017-05-10 (×5): qty 1

## 2017-05-10 NOTE — Progress Notes (Addendum)
[facility]  [address] [place], [place] [address] Phone: [phone]  FAX: [phone]      VINT POLA [id number] [birth year]  CARE TEAM:  PCP: Lujean Amel, MD  Outpatient Care Team: Patient Care Team: Lujean Amel, MD as PCP - General (Family Medicine) Heath Lark, MD as Consulting Physician (Hematology and Oncology)  Inpatient Treatment Team: Treatment Team: Attending Provider: [name], Arta Bruce, MD; Registered Nurse: Nadara Mode, RN; Technician: Sueanne Margarita, NT; Registered Nurse: Consuela Mimes, RN; Registered Nurse: Thermon Leyland, RN   Problem List:   Principal Problem:   Right adrenal mass s/p adrenalectomy 05/06/2017 Active Problems:   GERD   Tobacco abuse   COPD (chronic obstructive pulmonary disease) ([facility])   Essential hypertension   Hyperthyroidism   H/O total adrenalectomy ([facility])   Intra-abdominal adhesions s/p SB resection 05/06/2017   Chronic narcotic use   Anxiety state   4 Days Post-Op  05/06/2017  OPEN RIGHT ADRENALECTOMY  LYSIS OF ADHESIONS SMALL BOWEL RESECTION AND ANASTOMOSIS   Assessment  Fair but a little better  Plan:  -Try pureed diet.  Adv to solids tomorrow if better.  If much worse, NPO & maybe place nasogastric tube.  Less likely now  -Pain control  Continue scheduled Robaxin/ice/Tylenol/gabapenin/Fent patch.  Inc gabapentin & tylenol.  Stop NSAIDs w inc Cr.  Cont PRN Dilaudid.  Restart oxy w BM.  Challenge in this patient on chronic narcotics.  -Anxiolysis.  CIWA protocol to help follow LOC & agitation  -Nicotine patch for withdrawal  -HTN - Beta blocker and clonidine.  Make sure he is not getting rebound hypertension.  Blood pressure OK for now.  -hyperthyroidism - check TSH, free T4, T3.  On no Tx (med intolerance).  If abnormal, med consult  -Keep dry but have PRN IVF in case Cr inc more or UOP less since some atelectasis/edema.  Hold home diuretic.    IV  fluid back up if urine output goes down.  -No more fever & WBC now WNL hopeful sign - hold off on ABx.  Atelectasis likely cause of temperature spike.  Pt still miserable.  Challenge to read.  Follow closely.  Recheck Xrays in AM.   If not better or worse, consider CAT scan.  No strong evidence of peritonitis or leak at this time.  -VTE prophylaxis- SCDs, etc  -GERD- PPI  -mobilize as tolerated to help recovery.  Challenge  30 minutes spent in review, evaluation, examination, counseling, and coordination of care.  More than 50% of that time was spent in counseling.  I updated the patient's status to the patient, spouse, and nurse.  Recommendations were made.  Questions were answered.  They expressed understanding & some appreciation.  Patient understandably frustrated & worried.   Adin Hector, M.D., F.A.C.S. Gastrointestinal and Minimally Invasive Surgery [facility] [address] [place],  [address] [phone] Main / Paging   05/10/2017    Subjective: (Chief complaint)  Patient felt better after "good sized" bowel movement this AM  Still having sharp pains at incision.  Concerning to him.  However not diffuse pain.  Dilaudid intermittent shots covering it.  Feels hot.  Occasional chills.  Wife at bedside.  There is just outside room.  Tolerated some liquids.  Appetite poor.    Worried that he is wiped out.  Frustrated that he is not normal.    Objective:  Vital signs:  Vitals:   05/10/17  0130 05/10/17 0140 05/10/17 0302 05/10/17 0543  BP: 116/78 115/77  131/70  Pulse: (!) 104   (!) 111  Resp:    (!) 24  Temp: 98.7 F (37.1 C)   99.6 F (37.6 C)  TempSrc: Oral   Oral  SpO2: 97%  99% 90%  Weight:      Height:        Last BM Date: 05/08/17  Intake/Output   Yesterday:  07/14 0701 - 07/15 0700 In: 780 [P.O.:240; I.V.:375; IV Piggyback:165] Out: 830 [Urine:580; Drains:250] This shift:  No intake/output  data recorded.  Bowel function:  Flatus: YES  BM:  YES  Drain: (No drain).  Wound VAC with old blood.   Physical Exam:  General: Pt awake/alert/oriented x4 in mild acute distress.  Less restless.  Or talkative.  Sitting up sipping liquids.  Uncomfortable.  Somewhat consolable. Eyes: PERRL, normal EOM.  Sclera clear.  No icterus Neuro: CN II-XII intact w/o focal sensory/motor deficits. Lymph: No head/neck/groin lymphadenopathy Psych:  No delerium/psychosis/paranoia HENT: Normocephalic, Mucus membranes moist.  No thrush Neck: Supple, No tracheal deviation Chest: No chest wall pain w good excursion CV:  Pulses intact.  Regular rhythm MS: Normal AROM mjr joints.  No obvious deformity  Abdomen: Somewhat firm. - softer.   Mildy distended.  Tenderness at RUQ incision only..  Right upper quadrant incision clean.  Wound VAC with serosanguineous drainage.  Patient claims foul odor.  Apparent to me at this time.  Abdomen does feel hot.  No cellulitis.  No erythema.  No swelling.  No purulence.  No bile.  No stool. No evidence of peritonitis.  No incarcerated hernias.  Ext:  No deformity.  1+ edema.  No cyanosis Skin: No petechiae / purpura  Results:   Labs: Results for orders placed or performed during the hospital encounter of 05/06/17 (from the past 48 hour(s))  CBC     Status: Abnormal   Collection Time: 05/09/17  5:09 AM  Result Value Ref Range   WBC 18.8 (H) 4.0 - 10.5 K/uL   RBC 3.72 (L) 4.22 - 5.81 MIL/uL   Hemoglobin 10.3 (L) 13.0 - 17.0 g/dL   HCT 29.3 (L) 39.0 - 52.0 %   MCV 78.8 78.0 - 100.0 fL   MCH 27.7 26.0 - 34.0 pg   MCHC 35.2 30.0 - 36.0 g/dL   RDW 14.8 11.5 - 15.5 %   Platelets 217 150 - 400 K/uL  Basic metabolic panel     Status: Abnormal   Collection Time: 05/09/17  5:09 AM  Result Value Ref Range   Sodium 136 135 - 145 mmol/L   Potassium 3.4 (L) 3.5 - 5.1 mmol/L   Chloride 101 101 - 111 mmol/L   CO2 24 22 - 32 mmol/L   Glucose, Bld 106 (H) 65 - 99 mg/dL    BUN 12 6 - 20 mg/dL   Creatinine, Ser 1.02 0.61 - 1.24 mg/dL   Calcium 7.2 (L) 8.9 - 10.3 mg/dL   GFR calc non Af Amer >60 >60 mL/min   GFR calc Af Amer >60 >60 mL/min    Comment: (NOTE) The eGFR has been calculated using the CKD EPI equation. This calculation has not been validated in all clinical situations. eGFR's persistently <60 mL/min signify possible Chronic Kidney Disease.    Anion gap 11 5 - 15  CBC     Status: Abnormal   Collection Time: 05/10/17  5:02 AM  Result Value Ref Range   WBC 6.4 4.0 -  10.5 K/uL   RBC 3.70 (L) 4.22 - 5.81 MIL/uL   Hemoglobin 10.2 (L) 13.0 - 17.0 g/dL   HCT 29.7 (L) 39.0 - 52.0 %   MCV 80.3 78.0 - 100.0 fL   MCH 27.6 26.0 - 34.0 pg   MCHC 34.3 30.0 - 36.0 g/dL   RDW 14.7 11.5 - 15.5 %   Platelets 258 150 - 400 K/uL  Basic metabolic panel     Status: Abnormal   Collection Time: 05/10/17  5:02 AM  Result Value Ref Range   Sodium 137 135 - 145 mmol/L   Potassium 3.6 3.5 - 5.1 mmol/L   Chloride 104 101 - 111 mmol/L   CO2 23 22 - 32 mmol/L   Glucose, Bld 123 (H) 65 - 99 mg/dL   BUN 20 6 - 20 mg/dL   Creatinine, Ser 1.39 (H) 0.61 - 1.24 mg/dL   Calcium 7.6 (L) 8.9 - 10.3 mg/dL   GFR calc non Af Amer 55 (L) >60 mL/min   GFR calc Af Amer >60 >60 mL/min    Comment: (NOTE) The eGFR has been calculated using the CKD EPI equation. This calculation has not been validated in all clinical situations. eGFR's persistently <60 mL/min signify possible Chronic Kidney Disease.    Anion gap 10 5 - 15    Imaging / Studies: Dg Chest Port 1 View  Result Date: 05/09/2017 CLINICAL DATA:  56 year old male with history of shortness of breath. History of prior inguinal hernia repair. EXAM: PORTABLE CHEST 1 VIEW COMPARISON:  Chest x-ray 05/07/2017. FINDINGS: Elevated right hemidiaphragm with evidence of pneumoperitoneum beneath the right hemidiaphragm. Opacities at the right lung base likely reflect subsegmental atelectasis, although underlying airspace  consolidation is not excluded. Patchy areas of interstitial prominence are noted in the lungs bilaterally, most significant in the right mid to upper lung, concerning for potential developing bronchopneumonia. Small right pleural effusion. No left pleural effusion. No evidence of pulmonary edema. Heart size is normal. Upper mediastinal contours are within normal limits. Aortic atherosclerosis. Orthopedic fixation hardware in the lower cervical spine. IMPRESSION: 1. Findings are concerning for developing multilobar bronchopneumonia. 2. Worsening atelectasis and/or consolidation in the right lower lobe. Small right pleural effusion. 3. Persistent pneumoperitoneum beneath the right hemidiaphragm, presumably iatrogenic related to recent surgery. Electronically Signed   By: Vinnie Langton M.D.   On: 05/09/2017 23:48    Medications / Allergies: per chart  Antibiotics: Anti-infectives    Start     Dose/Rate Route Frequency Ordered Stop   05/06/17 0916  vancomycin (VANCOCIN) IVPB 1000 mg/200 mL premix     1,000 mg 200 mL/hr over 60 Minutes Intravenous On call to O.R. 05/06/17 0916 05/06/17 1229        Note: Portions of this report may have been transcribed using voice recognition software. Every effort was made to ensure accuracy; however, inadvertent computerized transcription errors may be present.   Any transcriptional errors that result from this process are unintentional.     Adin Hector, M.D., F.A.C.S. Gastrointestinal and Minimally Invasive Surgery [facility] [address], [place], [place] [address] [phone] Main / Paging   05/10/2017

## 2017-05-10 NOTE — Progress Notes (Signed)
Rapid Response Event Note  Overview: Called to patient room to assess oxygen saturations in the high 80's low 90's on 3L [place].     Initial Focused Assessment: On arrival patient resting comfortably. Denied chest pain or shortness of breath.  Breath sounds diminished with expiratory wheeze.   Interventions: Oxygen probe changed, patient still on 3L, saturations now reading 96-99%. RT called for breathing treatment.   Plan of Care (if not transferred): Encourage use of incentive spirometry, as well as cough and deep breathing with use of pillow to brace surgery site. Continue to control pain.       Lollie Marrow

## 2017-05-10 NOTE — Progress Notes (Signed)
Nursing note: witnessed waste of 1.81ml from pca syringe wasted by Michelene Gardener

## 2017-05-10 NOTE — Progress Notes (Signed)
Nursing Note: In to give pain meds and IV site infiltrated.Paged IV .Pt c/o SOB,abd tight on L side, not so much on r side where pt has wound vac in place.Upon entering room,notable odor of stool ,but pt has not had BM.T-102.6 ax P-128 r-30 Bp 136/64 PO2 88% on 3L n/c.Pt has PCA infusion.A; Paged on-call.Spoke w/ Dr.Gross and orders received.Lasix 20 IV, CXR,EKG,IVF decrease to KVO,will give tylenol for temp.wbb

## 2017-05-10 NOTE — Progress Notes (Signed)
Nursing Note: Paged on-call as results of CXR available .wbb

## 2017-05-10 NOTE — Progress Notes (Signed)
Nursing Note: Wasted 1.5 ml form PCa syringe as PCa was discontinued.Wasted med w/Amanda Lemons,RN[ med was not pulled from this pyxis so it did not show up..wbb

## 2017-05-10 NOTE — Progress Notes (Signed)
Nursing Note: MD is at the bedside talking w/ pt and wife.Dr.Gross.wbb

## 2017-05-11 ENCOUNTER — Inpatient Hospital Stay (HOSPITAL_COMMUNITY): Payer: Medicare Other

## 2017-05-11 LAB — CBC
HCT: 29.2 % — ABNORMAL LOW (ref 39.0–52.0)
Hemoglobin: 10 g/dL — ABNORMAL LOW (ref 13.0–17.0)
MCH: 27 pg (ref 26.0–34.0)
MCHC: 34.2 g/dL (ref 30.0–36.0)
MCV: 78.7 fL (ref 78.0–100.0)
PLATELETS: 297 10*3/uL (ref 150–400)
RBC: 3.71 MIL/uL — AB (ref 4.22–5.81)
RDW: 14.8 % (ref 11.5–15.5)
WBC: 9.1 10*3/uL (ref 4.0–10.5)

## 2017-05-11 LAB — BASIC METABOLIC PANEL
Anion gap: 10 (ref 5–15)
BUN: 25 mg/dL — AB (ref 6–20)
CO2: 25 mmol/L (ref 22–32)
CREATININE: 1.39 mg/dL — AB (ref 0.61–1.24)
Calcium: 7.7 mg/dL — ABNORMAL LOW (ref 8.9–10.3)
Chloride: 103 mmol/L (ref 101–111)
GFR calc Af Amer: >60 mL/min (ref >60)
GFR, EST NON AFRICAN AMERICAN: 55 mL/min — AB (ref >60)
GLUCOSE: 117 mg/dL — AB (ref 65–99)
POTASSIUM: 3.7 mmol/L (ref 3.5–5.1)
Sodium: 138 mmol/L (ref 135–145)

## 2017-05-11 NOTE — Progress Notes (Addendum)
Tried to speak with patient at bedside regarding [place] plans. Patient having lots of pain and asked if I could come back later. Plan for d/c with vac, faxed application and clinicals to [facility] for approval. Awaiting final approval and [place] orders. [phone]

## 2017-05-11 NOTE — Progress Notes (Signed)
Pharmacy IV to PO conversion  The patient is ordered thiamine and diphenhydramine by the intravenous route with linked PO options available.  Based on criteria approved by the Pharmacy and [place], the IV options are being discontinued.   No active GI bleeding or impaired absorption  Not s/p esophagectomy  Documented ability to take oral medications for > 24 hr  Plan to continue treatment for at least 1 day  If you have any questions about this conversion, please contact the Pharmacy Department (ext [phone]).  Thank you.  Reuel Boom, PharmD Pager: [phone] 05/11/2017, 2:20 PM

## 2017-05-11 NOTE — Progress Notes (Signed)
Pt left floor for x-ray

## 2017-05-11 NOTE — Progress Notes (Signed)
Progress Note: General Surgery Service   Assessment/Plan: Patient Active Problem List   Diagnosis Date Noted  . Chronic narcotic use 05/10/2017  . Anxiety state 05/10/2017  . Intra-abdominal adhesions s/p SB resection 05/06/2017 05/09/2017  . H/O total adrenalectomy ([place]) 05/06/2017  . Right adrenal mass s/p adrenalectomy 05/06/2017 05/06/2017  . Throat symptom 03/18/2016  . Multinodular goiter 01/19/2016  . Hyperthyroidism 01/19/2016  . Essential hypertension   . Diarrhea 11/30/2014  . Anemia, iron deficiency 11/01/2014  . Leukocytosis 11/03/2013  . Tobacco abuse 07/26/2013  . COPD (chronic obstructive pulmonary disease) ([place]) 07/26/2013  . Left knee pain 07/26/2013  . Pain in joint, shoulder region 05/03/2013  . GERD 07/24/2008  . 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   s/p Procedure(s): OPEN RIGHT ADRENALECTOMY WITH SMALL BOWEL RESECTION AND ANASTOMOSIS 05/06/2017 +BM yesterday, pain still issue but walked in hall today -stop clonidine as patient is hypotensive and had no rebound after being stopped post operatively -increase fluids for AKI, no response to diuresis and likely third spaced from retroperitoneal dissection and atelectasis -continue IS -replace K -advance to regular diet    LOS: 5 days  Chief Complaint/Subjective: Walked in hall today, still having pain issues, tolerated some diet  Objective: Vital signs in last 24 hours: Temp:  [98.5 F (36.9 C)-99.1 F (37.3 C)] 98.9 F (37.2 C) (07/16 0602) Pulse Rate:  [98-116] 103 (07/16 0602) Resp:  [16-19] 18 (07/16 0602) BP: (106-128)/(64-83) 119/73 (07/16 0602) SpO2:  [90 %-92 %] 90 % (07/16 0602) Last BM Date: 05/08/17  Intake/Output from previous day: 07/15 0701 - 07/16 0700 In: 1689.8 [P.O.:1040; I.V.:599.8] Out: 927 [Urine:577; Drains:350] Intake/Output this shift: No intake/output data recorded.  Lungs: IS 1000, difficulty with deep breaths  Cardiovascular: tachycardic  Abd: soft,  slight distension, vac in place, TTP RuQ  Extremities: no edema  Neuro: AOx4  Lab Results: CBC   Recent Labs  05/10/17 0502 05/11/17 0500  WBC 6.4 9.1  HGB 10.2* 10.0*  HCT 29.7* 29.2*  PLT 258 297   BMET  Recent Labs  05/10/17 0502 05/11/17 0500  NA 137 138  K 3.6 3.7  CL 104 103  CO2 23 25  GLUCOSE 123* 117*  BUN 20 25*  CREATININE 1.39* 1.39*  CALCIUM 7.6* 7.7*   PT/INR No results for input(s): LABPROT, INR in the last 72 hours. ABG No results for input(s): PHART, HCO3 in the last 72 hours.  Invalid input(s): PCO2, PO2  Studies/Results:  Anti-infectives: Anti-infectives    Start     Dose/Rate Route Frequency Ordered Stop   05/06/17 0916  vancomycin (VANCOCIN) IVPB 1000 mg/200 mL premix     1,000 mg 200 mL/hr over 60 Minutes Intravenous On call to O.R. 05/06/17 0916 05/06/17 1229      Medications: Scheduled Meds: . acetaminophen  1,000 mg Oral QID  . bisacodyl  10 mg Rectal Daily  . enoxaparin (LOVENOX) injection  40 mg Subcutaneous Q24H  . feeding supplement  237 mL Oral BID BM  . fentaNYL  25 mcg Transdermal Q72H  . folic acid  1 mg Oral Daily  . gabapentin  300 mg Oral TID  . labetalol  50 mg Oral BID  . lip balm  1 application Topical BID  . multivitamin with minerals  1 tablet Oral Daily  . nicotine  21 mg Transdermal Daily  . pantoprazole  40 mg Oral BID  . polyethylene glycol  17 g Oral BID  . potassium chloride SA  20 mEq Oral  Daily  . saccharomyces boulardii  250 mg Oral BID  . simethicone  40 mg Oral QID  . thiamine  100 mg Oral Daily   Or  . thiamine  100 mg Intravenous Daily   Continuous Infusions: . dextrose 5 % and 0.45 % NaCl with KCl 20 mEq/L 1,000 mL (05/10/17 0009)  . lactated ringers    . lactated ringers     PRN Meds:.albuterol, alum & mag hydroxide-simeth, diphenhydrAMINE **OR** diphenhydrAMINE, guaiFENesin-dextromethorphan, hydrocortisone, hydrocortisone cream, HYDROmorphone (DILAUDID) injection, lactated ringers,  lactated ringers, LORazepam **OR** LORazepam, magic mouthwash, menthol-cetylpyridinium, metoCLOPramide (REGLAN) injection, ondansetron **OR** ondansetron (ZOFRAN) IV, oxyCODONE, phenol, polyethylene glycol, prochlorperazine  Mickeal Skinner, MD Pg# [phone] [facility]

## 2017-05-12 LAB — CBC
HCT: 28.9 % — ABNORMAL LOW (ref 39.0–52.0)
Hemoglobin: 10 g/dL — ABNORMAL LOW (ref 13.0–17.0)
MCH: 27.1 pg (ref 26.0–34.0)
MCHC: 34.6 g/dL (ref 30.0–36.0)
MCV: 78.3 fL (ref 78.0–100.0)
PLATELETS: 335 10*3/uL (ref 150–400)
RBC: 3.69 MIL/uL — AB (ref 4.22–5.81)
RDW: 14.5 % (ref 11.5–15.5)
WBC: 17 10*3/uL — ABNORMAL HIGH (ref 4.0–10.5)

## 2017-05-12 LAB — BASIC METABOLIC PANEL
Anion gap: 11 (ref 5–15)
BUN: 26 mg/dL — AB (ref 6–20)
CALCIUM: 8.2 mg/dL — AB (ref 8.9–10.3)
CO2: 23 mmol/L (ref 22–32)
CREATININE: 0.93 mg/dL (ref 0.61–1.24)
Chloride: 104 mmol/L (ref 101–111)
GFR calc Af Amer: >60 mL/min (ref >60)
Glucose, Bld: 125 mg/dL — ABNORMAL HIGH (ref 65–99)
POTASSIUM: 3.5 mmol/L (ref 3.5–5.1)
SODIUM: 138 mmol/L (ref 135–145)

## 2017-05-12 LAB — T3: T3, Total: 102 ng/dL (ref 71–180)

## 2017-05-12 MED ORDER — IPRATROPIUM-ALBUTEROL 0.5-2.5 (3) MG/3ML IN SOLN
3.0000 mL | Freq: Four times a day (QID) | RESPIRATORY_TRACT | Status: DC
  Administered 2017-05-12 – 2017-05-31 (×75): 3 mL via RESPIRATORY_TRACT
  Filled 2017-05-12 (×76): qty 3

## 2017-05-12 MED ORDER — DEXTROSE 5 % IV SOLN
1.0000 g | Freq: Three times a day (TID) | INTRAVENOUS | Status: AC
  Administered 2017-05-12 – 2017-05-26 (×43): 1 g via INTRAVENOUS
  Filled 2017-05-12 (×46): qty 1

## 2017-05-12 MED ORDER — VANCOMYCIN HCL IN DEXTROSE 1-5 GM/200ML-% IV SOLN
1000.0000 mg | Freq: Two times a day (BID) | INTRAVENOUS | Status: DC
  Administered 2017-05-12 – 2017-05-15 (×9): 1000 mg via INTRAVENOUS
  Filled 2017-05-12 (×10): qty 200

## 2017-05-12 MED ORDER — AZTREONAM IN DEXTROSE 2 GM/50ML IV SOLN
2.0000 g | Freq: Once | INTRAVENOUS | Status: AC
  Administered 2017-05-12: 2 g via INTRAVENOUS
  Filled 2017-05-12: qty 50

## 2017-05-12 NOTE — Progress Notes (Signed)
Patient has had restless night. Medicated X 4 for pain this shift, see MAR. Pt continues to have increased resp rate, Oxygen saturations remain in the mid to upper 90s. Collection canister replaced on wound vac, pt continues to have noticeable foul odor despite connection of wound vac.Resp Therapy has evaluated and given patient SVNs X 2 this shift due to noted I/E wheezing, and continued SOB. No acute resp distress noted this shift. Family member has remained at bedside. Pt refused all of his 2200 medications last evening due to not feeling as if he could "tolerate swallowing anything at this time."

## 2017-05-12 NOTE — Care Management Important Message (Signed)
Important Message  Patient Details  Name: ELIA KEENUM MRN: [id number] Date of Birth: [birth year]   Medicare Important Message Given:  Yes    Kerin Salen 05/12/2017, 10:47 AMImportant Message  Patient Details  Name: SHAQUIL ALDANA MRN: [id number] Date of Birth: 17-Oct-1961   Medicare Important Message Given:  Yes    Kerin Salen 05/12/2017, 10:47 AM

## 2017-05-13 ENCOUNTER — Encounter (HOSPITAL_COMMUNITY)
Admission: RE | Disposition: A | Discharge status: Home Health | Payer: Self-pay | Source: Ambulatory Visit | Attending: Surgery

## 2017-05-13 ENCOUNTER — Inpatient Hospital Stay (HOSPITAL_COMMUNITY): Payer: Medicare Other

## 2017-05-13 ENCOUNTER — Inpatient Hospital Stay (HOSPITAL_COMMUNITY): Payer: Medicare Other | Admitting: Anesthesiology

## 2017-05-13 DIAGNOSIS — J449 Chronic obstructive pulmonary disease, unspecified: Secondary | ICD-10-CM

## 2017-05-13 DIAGNOSIS — F1190 Opioid use, unspecified, uncomplicated: Secondary | ICD-10-CM

## 2017-05-13 DIAGNOSIS — E279 Disorder of adrenal gland, unspecified: Secondary | ICD-10-CM

## 2017-05-13 HISTORY — PX: LAPAROTOMY: SHX154

## 2017-05-13 LAB — BLOOD GAS, ARTERIAL
Acid-Base Excess: 1.7 mmol/L (ref 0.0–2.0)
Bicarbonate: 28.1 mmol/L — ABNORMAL HIGH (ref 20.0–28.0)
Drawn by: 232811
FIO2: 60
LHR: 16 {breaths}/min
MECHVT: 580 mL
O2 Saturation: 96.1 %
PATIENT TEMPERATURE: 98.2
PEEP: 5 cmH2O
pCO2 arterial: 55.6 mmHg — ABNORMAL HIGH (ref 32.0–48.0)
pH, Arterial: 7.323 — ABNORMAL LOW (ref 7.350–7.450)
pO2, Arterial: 98 mmHg (ref 83.0–108.0)

## 2017-05-13 LAB — CBC
HCT: 25.9 % — ABNORMAL LOW (ref 39.0–52.0)
HEMOGLOBIN: 9.1 g/dL — AB (ref 13.0–17.0)
MCH: 27.6 pg (ref 26.0–34.0)
MCHC: 35.1 g/dL (ref 30.0–36.0)
MCV: 78.5 fL (ref 78.0–100.0)
Platelets: 512 10*3/uL — ABNORMAL HIGH (ref 150–400)
RBC: 3.3 MIL/uL — ABNORMAL LOW (ref 4.22–5.81)
RDW: 14.2 % (ref 11.5–15.5)
WBC: 23.1 10*3/uL — ABNORMAL HIGH (ref 4.0–10.5)

## 2017-05-13 LAB — POCT I-STAT 7, (LYTES, BLD GAS, ICA,H+H)
ACID-BASE EXCESS: 3 mmol/L — AB (ref 0.0–2.0)
Bicarbonate: 29.2 mmol/L — ABNORMAL HIGH (ref 20.0–28.0)
CALCIUM ION: 1.11 mmol/L — AB (ref 1.15–1.40)
HEMATOCRIT: 25 % — AB (ref 39.0–52.0)
HEMOGLOBIN: 8.5 g/dL — AB (ref 13.0–17.0)
O2 SAT: 99 %
PH ART: 7.351 (ref 7.350–7.450)
POTASSIUM: 3.6 mmol/L (ref 3.5–5.1)
Sodium: 139 mmol/L (ref 135–145)
TCO2: 31 mmol/L (ref 0–100)
pCO2 arterial: 52.8 mmHg — ABNORMAL HIGH (ref 32.0–48.0)
pO2, Arterial: 154 mmHg — ABNORMAL HIGH (ref 83.0–108.0)

## 2017-05-13 LAB — BASIC METABOLIC PANEL
Anion gap: 11 (ref 5–15)
Anion gap: 7 (ref 5–15)
BUN: 17 mg/dL (ref 6–20)
BUN: 13 mg/dL (ref 6–20)
CALCIUM: 8.2 mg/dL — AB (ref 8.9–10.3)
CHLORIDE: 103 mmol/L (ref 101–111)
CO2: 27 mmol/L (ref 22–32)
CO2: 28 mmol/L (ref 22–32)
CREATININE: 0.86 mg/dL (ref 0.61–1.24)
Calcium: 7.8 mg/dL — ABNORMAL LOW (ref 8.9–10.3)
Chloride: 100 mmol/L — ABNORMAL LOW (ref 101–111)
Creatinine, Ser: 0.73 mg/dL (ref 0.61–1.24)
GFR calc Af Amer: >60 mL/min (ref >60)
GFR calc non Af Amer: >60 mL/min (ref >60)
GFR calc non Af Amer: >60 mL/min (ref >60)
GLUCOSE: 128 mg/dL — AB (ref 65–99)
Glucose, Bld: 132 mg/dL — ABNORMAL HIGH (ref 65–99)
POTASSIUM: 4.2 mmol/L (ref 3.5–5.1)
Potassium: 3.3 mmol/L — ABNORMAL LOW (ref 3.5–5.1)
SODIUM: 138 mmol/L (ref 135–145)
Sodium: 138 mmol/L (ref 135–145)

## 2017-05-13 LAB — CBC WITH DIFFERENTIAL/PLATELET
BASOS PCT: 0 %
Basophils Absolute: 0 10*3/uL (ref 0.0–0.1)
EOS ABS: 0.4 10*3/uL (ref 0.0–0.7)
Eosinophils Relative: 2 %
HCT: 26.7 % — ABNORMAL LOW (ref 39.0–52.0)
Hemoglobin: 9.4 g/dL — ABNORMAL LOW (ref 13.0–17.0)
LYMPHS ABS: 2 10*3/uL (ref 0.7–4.0)
Lymphocytes Relative: 9 %
MCH: 27.4 pg (ref 26.0–34.0)
MCHC: 35.2 g/dL (ref 30.0–36.0)
MCV: 77.8 fL — AB (ref 78.0–100.0)
MONO ABS: 1.7 10*3/uL — AB (ref 0.1–1.0)
Monocytes Relative: 8 %
NEUTROS ABS: 17.7 10*3/uL — AB (ref 1.7–7.7)
Neutrophils Relative %: 81 %
PLATELETS: 449 10*3/uL — AB (ref 150–400)
RBC: 3.43 MIL/uL — ABNORMAL LOW (ref 4.22–5.81)
RDW: 14.2 % (ref 11.5–15.5)
WBC: 21.8 10*3/uL — ABNORMAL HIGH (ref 4.0–10.5)

## 2017-05-13 LAB — CREATININE, SERUM
CREATININE: 0.91 mg/dL (ref 0.61–1.24)
GFR calc Af Amer: >60 mL/min (ref >60)

## 2017-05-13 LAB — TRIGLYCERIDES: TRIGLYCERIDES: 296 mg/dL — AB (ref <150)

## 2017-05-13 SURGERY — LAPAROTOMY, EXPLORATORY
Anesthesia: General

## 2017-05-13 MED ORDER — PROPOFOL 10 MG/ML IV BOLUS
INTRAVENOUS | Status: AC
  Filled 2017-05-13: qty 20

## 2017-05-13 MED ORDER — PROPOFOL 10 MG/ML IV BOLUS
INTRAVENOUS | Status: DC | PRN
Start: 2017-05-13 — End: 2017-05-13
  Administered 2017-05-13: 200 mg via INTRAVENOUS

## 2017-05-13 MED ORDER — POTASSIUM CHLORIDE 20 MEQ/15ML (10%) PO SOLN
40.0000 meq | Freq: Once | ORAL | Status: AC
  Administered 2017-05-13: 40 meq
  Filled 2017-05-13: qty 30

## 2017-05-13 MED ORDER — IOPAMIDOL (ISOVUE-300) INJECTION 61%
INTRAVENOUS | Status: DC
Start: 2017-05-13 — End: 2017-05-13
  Filled 2017-05-13: qty 75

## 2017-05-13 MED ORDER — FENTANYL BOLUS VIA INFUSION
50.0000 ug | INTRAVENOUS | Status: DC | PRN
  Administered 2017-05-13 – 2017-05-15 (×12): 50 ug via INTRAVENOUS
  Filled 2017-05-13: qty 50

## 2017-05-13 MED ORDER — SUFENTANIL CITRATE 50 MCG/ML IV SOLN
INTRAVENOUS | Status: AC
  Filled 2017-05-13: qty 1

## 2017-05-13 MED ORDER — MIDAZOLAM HCL 2 MG/2ML IJ SOLN
INTRAMUSCULAR | Status: AC
  Filled 2017-05-13: qty 2

## 2017-05-13 MED ORDER — IOPAMIDOL (ISOVUE-300) INJECTION 61%
INTRAVENOUS | Status: AC
  Administered 2017-05-13: 13:00:00
  Filled 2017-05-13: qty 30

## 2017-05-13 MED ORDER — ROCURONIUM BROMIDE 100 MG/10ML IV SOLN
INTRAVENOUS | Status: DC | PRN
  Administered 2017-05-13 (×3): 50 mg via INTRAVENOUS

## 2017-05-13 MED ORDER — LACTATED RINGERS IV SOLN
INTRAVENOUS | Status: DC | PRN
  Administered 2017-05-13 (×2): via INTRAVENOUS

## 2017-05-13 MED ORDER — DEXTROSE 5 % IV SOLN
INTRAVENOUS | Status: DC | PRN
  Administered 2017-05-13: 25 ug/min via INTRAVENOUS

## 2017-05-13 MED ORDER — CHLORHEXIDINE GLUCONATE 0.12% ORAL RINSE (MEDLINE KIT)
15.0000 mL | Freq: Two times a day (BID) | OROMUCOSAL | Status: DC
  Administered 2017-05-13 – 2017-05-26 (×26): 15 mL via OROMUCOSAL

## 2017-05-13 MED ORDER — FENTANYL CITRATE (PF) 100 MCG/2ML IJ SOLN: 50.0000 ug | Freq: Once | INTRAMUSCULAR | Status: AC

## 2017-05-13 MED ORDER — CLONIDINE ORAL SUSPENSION 10 MCG/ML: 0.1000 mg | Freq: Two times a day (BID) | ORAL | Status: DC

## 2017-05-13 MED ORDER — SODIUM CHLORIDE 0.9 % IJ SOLN
INTRAMUSCULAR | Status: AC
  Filled 2017-05-13: qty 10

## 2017-05-13 MED ORDER — FENTANYL CITRATE (PF) 2500 MCG/50ML IJ SOLN
25.0000 ug/h | INTRAMUSCULAR | Status: DC
  Administered 2017-05-13: 50 ug/h via INTRAVENOUS
  Administered 2017-05-14: 325 ug/h via INTRAVENOUS
  Administered 2017-05-14: 350 ug/h via INTRAVENOUS
  Administered 2017-05-14: 225 ug/h via INTRAVENOUS
  Administered 2017-05-15: 375 ug/h via INTRAVENOUS
  Administered 2017-05-15: 350 ug/h via INTRAVENOUS
  Administered 2017-05-15: 375 ug/h via INTRAVENOUS
  Administered 2017-05-15: 350 ug/h via INTRAVENOUS
  Administered 2017-05-16: 375 ug/h via INTRAVENOUS
  Administered 2017-05-16: 400 ug/h via INTRAVENOUS
  Administered 2017-05-16: 375 ug/h via INTRAVENOUS
  Administered 2017-05-17: 400 ug/h via INTRAVENOUS
  Filled 2017-05-13 (×13): qty 50

## 2017-05-13 MED ORDER — FENTANYL CITRATE (PF) 100 MCG/2ML IJ SOLN
100.0000 ug | INTRAMUSCULAR | Status: DC | PRN
  Administered 2017-05-13 (×2): 100 ug via INTRAVENOUS
  Filled 2017-05-13 (×2): qty 2

## 2017-05-13 MED ORDER — IOPAMIDOL (ISOVUE-300) INJECTION 61%
100.0000 mL | Freq: Once | INTRAVENOUS | Status: AC | PRN
  Administered 2017-05-13: 100 mL via INTRAVENOUS

## 2017-05-13 MED ORDER — 0.9 % SODIUM CHLORIDE (POUR BTL) OPTIME
TOPICAL | Status: DC | PRN
  Administered 2017-05-13: 4000 mL

## 2017-05-13 MED ORDER — SUFENTANIL CITRATE 50 MCG/ML IV SOLN
INTRAVENOUS | Status: DC | PRN
  Administered 2017-05-13 (×3): 10 ug via INTRAVENOUS
  Administered 2017-05-13: 15 ug via INTRAVENOUS
  Administered 2017-05-13 (×2): 10 ug via INTRAVENOUS
  Administered 2017-05-13 (×2): 15 ug via INTRAVENOUS
  Administered 2017-05-13: 5 ug via INTRAVENOUS
  Administered 2017-05-13: 10 ug via INTRAVENOUS
  Administered 2017-05-13: 5 ug via INTRAVENOUS
  Administered 2017-05-13: 10 ug via INTRAVENOUS
  Administered 2017-05-13: 15 ug via INTRAVENOUS
  Administered 2017-05-13 (×3): 10 ug via INTRAVENOUS
  Administered 2017-05-13: 5 ug via INTRAVENOUS

## 2017-05-13 MED ORDER — IOPAMIDOL (ISOVUE-300) INJECTION 61%: 15.0000 mL | Freq: Two times a day (BID) | INTRAVENOUS | Status: DC | PRN

## 2017-05-13 MED ORDER — LACTATED RINGERS IV SOLN
INTRAVENOUS | Status: DC | PRN
  Administered 2017-05-13 (×3): via INTRAVENOUS

## 2017-05-13 MED ORDER — FENTANYL BOLUS VIA INFUSION
50.0000 ug | INTRAVENOUS | Status: DC | PRN
  Filled 2017-05-13: qty 50

## 2017-05-13 MED ORDER — MIDAZOLAM HCL 2 MG/2ML IJ SOLN
2.0000 mg | INTRAMUSCULAR | Status: DC | PRN
  Administered 2017-05-13 – 2017-05-17 (×9): 2 mg via INTRAVENOUS
  Filled 2017-05-13 (×9): qty 2

## 2017-05-13 MED ORDER — LABETALOL HCL 5 MG/ML IV SOLN
INTRAVENOUS | Status: AC
  Administered 2017-05-13: 20 mg
  Filled 2017-05-13: qty 4

## 2017-05-13 MED ORDER — PROPOFOL 1000 MG/100ML IV EMUL
0.0000 ug/kg/min | INTRAVENOUS | Status: DC
  Administered 2017-05-13: 5 ug/kg/min via INTRAVENOUS
  Administered 2017-05-14: 45 ug/kg/min via INTRAVENOUS
  Administered 2017-05-14: 30 ug/kg/min via INTRAVENOUS
  Administered 2017-05-14: 50 ug/kg/min via INTRAVENOUS
  Administered 2017-05-14: 25 ug/kg/min via INTRAVENOUS
  Administered 2017-05-14: 30 ug/kg/min via INTRAVENOUS
  Administered 2017-05-15 – 2017-05-16 (×5): 35 ug/kg/min via INTRAVENOUS
  Administered 2017-05-16 (×3): 40 ug/kg/min via INTRAVENOUS
  Administered 2017-05-16: 35 ug/kg/min via INTRAVENOUS
  Administered 2017-05-17: 45 ug/kg/min via INTRAVENOUS
  Administered 2017-05-17: 50 ug/kg/min via INTRAVENOUS
  Filled 2017-05-13 (×19): qty 100

## 2017-05-13 MED ORDER — FENTANYL CITRATE (PF) 100 MCG/2ML IJ SOLN
INTRAMUSCULAR | Status: AC
  Administered 2017-05-13: 100 ug via INTRAVENOUS
  Filled 2017-05-13: qty 2

## 2017-05-13 MED ORDER — PANTOPRAZOLE SODIUM 40 MG IV SOLR
40.0000 mg | INTRAVENOUS | Status: DC
  Administered 2017-05-13 – 2017-05-30 (×18): 40 mg via INTRAVENOUS
  Filled 2017-05-13 (×19): qty 40

## 2017-05-13 MED ORDER — IPRATROPIUM-ALBUTEROL 20-100 MCG/ACT IN AERS
INHALATION_SPRAY | RESPIRATORY_TRACT | Status: DC | PRN
  Administered 2017-05-13: 3 via RESPIRATORY_TRACT

## 2017-05-13 MED ORDER — LABETALOL HCL 5 MG/ML IV SOLN
20.0000 mg | INTRAVENOUS | Status: DC | PRN
Start: 2017-05-13 — End: 2017-08-21

## 2017-05-13 MED ORDER — ORAL CARE MOUTH RINSE
15.0000 mL | Freq: Four times a day (QID) | OROMUCOSAL | Status: DC
  Administered 2017-05-13 – 2017-05-26 (×43): 15 mL via OROMUCOSAL

## 2017-05-13 MED ORDER — MIDAZOLAM HCL 2 MG/2ML IJ SOLN
2.0000 mg | INTRAMUSCULAR | Status: DC | PRN
  Administered 2017-05-14 (×2): 2 mg via INTRAVENOUS
  Filled 2017-05-13 (×2): qty 2

## 2017-05-13 MED ORDER — SODIUM CHLORIDE 0.9 % IV SOLN
25.0000 ug/h | INTRAVENOUS | Status: DC
  Filled 2017-05-13 (×2): qty 50

## 2017-05-13 MED ORDER — SUCCINYLCHOLINE CHLORIDE 20 MG/ML IJ SOLN
INTRAMUSCULAR | Status: DC | PRN
  Administered 2017-05-13: 120 mg via INTRAVENOUS

## 2017-05-13 MED ORDER — VANCOMYCIN HCL IN DEXTROSE 1-5 GM/200ML-% IV SOLN
INTRAVENOUS | Status: AC
  Filled 2017-05-13: qty 200

## 2017-05-13 MED ORDER — MIDAZOLAM HCL 5 MG/5ML IJ SOLN
INTRAMUSCULAR | Status: DC | PRN
  Administered 2017-05-13 (×4): 1 mg via INTRAVENOUS

## 2017-05-13 MED ORDER — LIDOCAINE HCL (CARDIAC) 20 MG/ML IV SOLN
INTRAVENOUS | Status: DC | PRN
  Administered 2017-05-13: 75 mg via INTRAVENOUS
  Administered 2017-05-13: 25 mg via INTRATRACHEAL

## 2017-05-13 MED ORDER — FENTANYL CITRATE (PF) 100 MCG/2ML IJ SOLN: 50.0000 ug | Freq: Once | INTRAMUSCULAR | Status: DC

## 2017-05-13 MED ORDER — IOPAMIDOL (ISOVUE-300) INJECTION 61%
INTRAVENOUS | Status: AC
  Filled 2017-05-13: qty 100

## 2017-05-13 MED ORDER — HYDROMORPHONE HCL 1 MG/ML IJ SOLN
INTRAMUSCULAR | Status: DC | PRN
  Administered 2017-05-13 (×2): 1 mg via INTRAVENOUS

## 2017-05-13 MED ORDER — HYDROMORPHONE HCL 2 MG/ML IJ SOLN
INTRAMUSCULAR | Status: AC
  Filled 2017-05-13: qty 1

## 2017-05-13 MED ORDER — ALBUTEROL SULFATE HFA 108 (90 BASE) MCG/ACT IN AERS
INHALATION_SPRAY | RESPIRATORY_TRACT | Status: AC
  Filled 2017-05-13: qty 6.7

## 2017-05-13 MED ORDER — FENTANYL BOLUS VIA INFUSION
100.0000 ug | INTRAVENOUS | Status: DC | PRN
  Administered 2017-05-14 – 2017-05-17 (×22): 100 ug via INTRAVENOUS
  Filled 2017-05-13: qty 100

## 2017-05-13 SURGICAL SUPPLY — 46 items
BANDAGE ELASTIC 4 VELCRO ST LF (GAUZE/BANDAGES/DRESSINGS) ×2 IMPLANT
BINDER BREAST MEDIUM (GAUZE/BANDAGES/DRESSINGS) ×2 IMPLANT
CHLORAPREP W/TINT 26ML (MISCELLANEOUS) IMPLANT
COVER MAYO STAND STRL (DRAPES) ×2 IMPLANT
COVER SURGICAL LIGHT HANDLE (MISCELLANEOUS) ×2 IMPLANT
DRAIN CHANNEL 19F RND (DRAIN) ×2 IMPLANT
DRAPE LAPAROSCOPIC ABDOMINAL (DRAPES) ×2 IMPLANT
DRAPE UTILITY XL STRL (DRAPES) ×2 IMPLANT
DRAPE WARM FLUID 44X44 (DRAPE) ×2 IMPLANT
DRSG OPSITE POSTOP 4X10 (GAUZE/BANDAGES/DRESSINGS) IMPLANT
DRSG OPSITE POSTOP 4X6 (GAUZE/BANDAGES/DRESSINGS) IMPLANT
DRSG OPSITE POSTOP 4X8 (GAUZE/BANDAGES/DRESSINGS) IMPLANT
ELECT BLADE 6.5 EXT (BLADE) IMPLANT
ELECT REM PT RETURN 15FT ADLT (MISCELLANEOUS) ×2 IMPLANT
EVACUATOR SILICONE 100CC (DRAIN) ×2 IMPLANT
GLOVE BIOGEL PI IND STRL 7.0 (GLOVE) ×1 IMPLANT
GLOVE BIOGEL PI INDICATOR 7.0 (GLOVE) ×1
GLOVE SURG SS PI 7.0 STRL IVOR (GLOVE) ×2 IMPLANT
GOWN STRL REUS W/TWL LRG LVL3 (GOWN DISPOSABLE) ×2 IMPLANT
GOWN STRL REUS W/TWL XL LVL3 (GOWN DISPOSABLE) ×2 IMPLANT
HANDLE SUCTION POOLE (INSTRUMENTS) ×1 IMPLANT
KIT BASIN OR (CUSTOM PROCEDURE TRAY) ×2 IMPLANT
PACK GENERAL/GYN (CUSTOM PROCEDURE TRAY) ×2 IMPLANT
PAD ABD 8X10 STRL (GAUZE/BANDAGES/DRESSINGS) ×2 IMPLANT
RELOAD PROXIMATE 100 BLUE (MISCELLANEOUS) IMPLANT
RELOAD PROXIMATE 100MM BLUE (MISCELLANEOUS)
SEALER TISSUE X1 CVD JAW (INSTRUMENTS) IMPLANT
SPONGE LAP 18X18 X RAY DECT (DISPOSABLE) IMPLANT
STAPLER PROXIMATE 100MM BLUE (MISCELLANEOUS) IMPLANT
STAPLER VISISTAT 35W (STAPLE) ×2 IMPLANT
SUCTION POOLE HANDLE (INSTRUMENTS) ×2
SUT ETHILON 1 LR 30 (SUTURE) ×4 IMPLANT
SUT ETHILON 2 0 PSLX (SUTURE) ×2 IMPLANT
SUT MNCRL AB 4-0 PS2 18 (SUTURE) IMPLANT
SUT PDS AB 0 CT1 36 (SUTURE) ×16 IMPLANT
SUT SILK 2 0 (SUTURE) ×1
SUT SILK 2 0 SH CR/8 (SUTURE) ×2 IMPLANT
SUT SILK 2-0 18XBRD TIE 12 (SUTURE) ×1 IMPLANT
SUT SILK 3 0 (SUTURE) ×1
SUT SILK 3 0 SH CR/8 (SUTURE) ×2 IMPLANT
SUT SILK 3-0 18XBRD TIE 12 (SUTURE) ×1 IMPLANT
TOWEL OR 17X26 10 PK STRL BLUE (TOWEL DISPOSABLE) ×6 IMPLANT
TOWEL OR NON WOVEN STRL DISP B (DISPOSABLE) ×2 IMPLANT
TRAY FOLEY W/METER SILVER 14FR (SET/KITS/TRAYS/PACK) IMPLANT
TRAY FOLEY W/METER SILVER 16FR (SET/KITS/TRAYS/PACK) ×2 IMPLANT
YANKAUER SUCT BULB TIP NO VENT (SUCTIONS) ×2 IMPLANT

## 2017-05-13 NOTE — Progress Notes (Signed)
Late entry: This morning noted pungent odor with wound and pt's abdomenn was taut and distended.  Asked [place] nurse to assess. Consulted with the PA on the floor, [name]. PA assessed wound and contacted Dr. Kieth Brightly.  Order was given for CT of abdomen. Pt later taken down for surgery.

## 2017-05-13 NOTE — Op Note (Signed)
Preoperative diagnosis: wound dehisence  Postoperative diagnosis: wound dehisence, anastomotic leak  Procedure: primary repair of small bowel anastomotic leak, abdominal closure of wound dehisence  Surgeon: Gurney Maxin, M.D.  Asst: Nedra Hai, M.D.  Anesthesia: general  Indications for procedure: [name] [name] is a 56 y.o. year old male who is s/p right adrenalectomy with complication of intestinal injury s/p small bowel resection with anastomosis who now presents with dehiscence and intestinal leak.  Description of procedure: The patient was brought into the operative suite. Anesthesia was administered with General endotracheal anesthesia. WHO checklist was applied. The patient was then placed in supine position. The area was prepped and draped in the usual sterile fashion.  The previous RUQ incision was inspected and small intestine contents were seen. The previous fascial closure sutures were free as some of the rectus muscle appeared to have disintegrated in the face of the leak. The sutures were removed. The wound was explored, there was a pin hole leak along the small intestinal anastomosis. The hole was closed with interrupted 3-0 silk sutures. Next, an adjacent loop of small intestine was chosen for serosal patch and multiple interrupted 3-0 silks were used to appose the serosal to the repair. Next, a large amount of saline was used to irrigate the abdomen. A 19 fr blake drain was placed inferior to the incision and laid along the repair and into the right posterior space. Next, the fascia was inspected and the fascia was closed in a single layer using 0 PDS and #1 nylons were used in U fashion to make retention sutures. Salinated gauze was put in place for dressing. Patient was taken to ICU due to concern for respiratory distress and sepsis.  Findings: pin hole anastomotic leak of small intestine. Dehiscence of fascia with partial disintegration of rectus muscle and  fascia  Specimen: none  Implant: 44fr blake drain   Blood loss: 184ml  Local anesthesia: none  Complications: none  Gurney Maxin, M.D. General, Bariatric, & Minimally Invasive Surgery [facility]

## 2017-05-13 NOTE — Anesthesia Procedure Notes (Signed)
Arterial Line Insertion Start/End7/18/2018 7:00 PM, 05/13/2017 7:05 PM Performed by: CRNA  Patient location: OR. Preanesthetic checklist: patient identified, site marked, surgical consent, monitors and equipment checked, pre-op evaluation and timeout performed Left, radial was placed Catheter size: 20 G Hand hygiene performed  and maximum sterile barriers used  Allen's test indicative of satisfactory collateral circulation Attempts: 1 Procedure performed without using ultrasound guided technique. Following insertion, dressing applied and Biopatch. Post procedure assessment: normal  Patient tolerated the procedure with difficulty. Additional procedure comments: Placed as per [place] MD.

## 2017-05-13 NOTE — Consult Note (Signed)
PULMONARY / CRITICAL CARE MEDICINE   Name: [name] [name] MRN: [id number] DOB: [birth year]    ADMISSION DATE:  05/06/2017 CONSULTATION DATE:  05/13/17  REFERRING MD:  [name]  CHIEF COMPLAINT:  Vent Management  HISTORY OF PRESENT ILLNESS:  Pt is encephelopathic; therefore, this HPI is obtained from chart review. [name] [name] is a 56 y.o. male with PMH as outlined below. 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  On 7/18, he developed a leak therefore was taken back to OR for re-do of ex-lap and repair of leak.  He returned to the ICU on the vent and PCCM was consulted for vent management.  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  PAST SURGICAL HISTORY: He  has a past surgical history that includes Appendectomy; Anterior cervical decomp/discectomy fusion (N/A, 03/18/2013); Hemicolectomy (Right, 04/13/2000); Small intestine surgery (04/24/2000); Circumcision (05/04/2006); Shoulder arthroscopy w/ rotator cuff repair (Left, 07/16/2010); Inguinal hernia repair; Knee arthroscopy (Left, 08/01/2013); Upper gastrointestinal endoscopy; Colonoscopy; Eye surgery; and Laparoscopic Adrenalectomy (Right, 05/06/2017).  Allergies  Allergen Reactions  . Chantix [Varenicline] Palpitations    Heart race Increased panic attacks  . Morphine Itching  . Penicillins Rash    Has patient had a PCN reaction causing immediate rash,  facial/tongue/throat swelling, SOB or lightheadedness with hypotension: Yes Has patient had a PCN reaction causing severe rash involving mucus membranes or skin necrosis: Unknown Has patient had a PCN reaction that required hospitalization: No Has patient had a PCN reaction occurring within the last 10 years: No If all of the above answers are "NO", then may proceed with Cephalosporin use.     No current facility-administered medications on file prior to encounter.    Current Outpatient Prescriptions on File Prior to Encounter  Medication Sig  . acetaminophen (TYLENOL) 500 MG tablet Take 1,500 mg by mouth daily as needed for mild pain.   Marland Kitchen atorvastatin (LIPITOR) 10 MG tablet Take 10 mg by mouth daily.  . chlorthalidone (HYGROTON) 25 MG tablet Take 12.5 mg by mouth daily.   Marland Kitchen gabapentin (NEURONTIN) 100 MG capsule Take 100 mg by mouth 2 (two) times daily as needed (nerve pain).   Marland Kitchen ibuprofen (ADVIL,MOTRIN) 200 MG tablet Take 600 mg by mouth daily as needed for headache. Reported on 03/18/2016  . methocarbamol (ROBAXIN) 500 MG tablet Take 1 tablet (500 mg total) by mouth 2 (two) times daily. (Patient taking differently: Take 500 mg by mouth daily as needed for muscle spasms. )  . Multiple Vitamin (MULTIVITAMIN WITH MINERALS) TABS Take 1 tablet by mouth daily.  Marland Kitchen omeprazole (PRILOSEC) 40 MG capsule Take 1 capsule (40 mg total) by mouth 2 (two) times daily. (Patient taking differently: Take 40 mg by mouth daily. )  . oxyCODONE-acetaminophen (PERCOCET) 10-325 MG per tablet Take 1-2 tablets by mouth every 6 (six) hours as needed for pain.  . tadalafil (CIALIS) 20 MG tablet Take 20 mg by mouth daily as needed for erectile dysfunction.  Marland Kitchen labetalol (NORMODYNE) 100 MG tablet Take 0.5 tablets (50 mg total) by mouth 2 (two) times daily. (  Patient not taking: Reported on 04/28/2017)  . [DISCONTINUED] simethicone (MYLICON) 80 MG chewable tablet Chew 80 mg by mouth every 6 (six) hours as needed for flatulence.     FAMILY HISTORY:  His indicated that his mother is alive. He indicated that his father is deceased. He indicated that the status of his sister is unknown. He indicated that the status of his neg hx is unknown.    SOCIAL HISTORY: He  reports that he has been smoking Cigarettes.  He has a 25.00 pack-year smoking history. He has never used smokeless tobacco. He reports that he drinks alcohol. He reports that he does not use drugs.  REVIEW OF SYSTEMS:   Unable to obtain as pt is encephalopathic.  SUBJECTIVE:  On vent, unresponsive.  VITAL SIGNS: BP (!) 139/91 (BP Location: Right Arm)   Pulse 95   Temp 98 F (36.7 C) (Oral)   Resp 18   Ht 5\' 10"  (1.778 m)   Wt 92.3 kg (203 lb 7.8 oz)   SpO2 94%   BMI 29.20 kg/m   HEMODYNAMICS:    VENTILATOR SETTINGS:    INTAKE / OUTPUT: I/O last 3 completed shifts: In: 5506.3 [P.O.:480; I.V.:4126.3; IV [id number]:701] Out: 7793 [Urine:1360; Drains:375]   PHYSICAL EXAMINATION: General: Adult male, resting in bed, in NAD. Neuro: Sedated, non-responsive. HEENT: [place]/AT. PERRL, sclerae anicteric. Cardiovascular: RRR, no M/R/G.  Lungs: Respirations even and unlabored.  CTA bilaterally, No W/R/R.  Abdomen: Abdominal dressings C/D/I.  JP drain in place. Musculoskeletal: No gross deformities, no edema.  Skin: Intact, warm, no rashes.   LABS:  BMET  Recent Labs Lab 05/11/17 0500 05/12/17 0519 05/13/17 0504 05/13/17 0920  NA 138 138  --  138  K 3.7 3.5  --  3.3*  CL 103 104  --  100*  CO2 25 23  --  27  BUN 25* 26*  --  17  CREATININE 1.39* 0.93 0.91 0.86  GLUCOSE 117* 125*  --  128*    Electrolytes  Recent Labs Lab 05/11/17 0500 05/12/17 0519 05/13/17 0920  CALCIUM 7.7* 8.2* 8.2*    CBC  Recent Labs Lab 05/11/17 0500 05/12/17 0519 05/13/17 0920  WBC 9.1 17.0* 21.8*  HGB 10.0* 10.0* 9.4*  HCT 29.2* 28.9* 26.7*  PLT 297 335 449*    Coag's No results for input(s): APTT, INR in the last 168 hours.  Sepsis  Markers No results for input(s): LATICACIDVEN, PROCALCITON, O2SATVEN in the last 168 hours.  ABG No results for input(s): PHART, PCO2ART, PO2ART in the last 168 hours.  Liver Enzymes No results for input(s): AST, ALT, ALKPHOS, BILITOT, ALBUMIN in the last 168 hours.  Cardiac Enzymes No results for input(s): TROPONINI, PROBNP in the last 168 hours.  Glucose No results for input(s): GLUCAP in the last 168 hours.  Imaging Ct Abdomen Pelvis W Contrast  Result Date: 05/13/2017 CLINICAL DATA:  Inpatient. Ileus. Severe abdominal pain. Status post laparoscopic converted to open right adrenalectomy on 90/30/0923 complicated by small bowel injury requiring small bowel resection with dense adhesions throughout the abdomen. Prior appendectomy and right hemicolectomy. EXAM: CT ABDOMEN AND PELVIS WITH CONTRAST TECHNIQUE: Multidetector CT imaging of the abdomen and pelvis was performed using the standard protocol following bolus administration of intravenous contrast. CONTRAST:  171mL ISOVUE-300 IOPAMIDOL (ISOVUE-300) INJECTION 61% COMPARISON:  05/11/2017 abdominal radiographs. 09/15/2015 CT abdomen/pelvis. 12/16/2016 chest CT. FINDINGS: Lower chest: Small dependent right pleural effusion. Patchy consolidation and ground-glass attenuation throughout both lung bases with some volume loss. Hepatobiliary: Normal liver  size. No discrete liver mass. Mild heterogeneity in the anterior inferior right liver lobe near the gallbladder fossa may be artifactual versus mild postsurgical change. Normal gallbladder with no radiopaque cholelithiasis. No biliary ductal dilatation. Pancreas: Normal, with no mass or duct dilation. Spleen: Normal size. No mass. Adrenals/Urinary Tract: Status post right adrenalectomy. Ill-defined 5.1 x 3.8 cm simple appearing fluid collection in the right adrenalectomy bed (series 2/ image 36) without internal gas or associated wall thickening. No left adrenal nodules. No hydronephrosis. No renal  masses. Stomach/Bowel: Distended fluid-filled stomach with no appreciable gastric wall thickening or pneumatosis. There is diffuse dilatation of the small bowel up to 5.6 cm diameter, with fluid levels throughout the small bowel. Surgical sutures are noted in the right abdomen from enteroenterostomy sites. No discrete small bowel caliber transition site. No small bowel wall thickening. Status post right hemicolectomy with intact appearing ileocolic anastomosis. Mild diffuse dilatation of the large bowel with fluid levels throughout the large bowel. No large bowel wall thickening. Vascular/Lymphatic: Atherosclerotic nonaneurysmal abdominal aorta. Patent portal, splenic, hepatic and renal veins. No pathologically enlarged lymph nodes in the abdomen or pelvis. Reproductive: Top-normal size prostate with nonspecific internal prostatic calcifications. Other: Pneumoperitoneum under the right hemidiaphragm and in the ventral right abdominal wall. No additional focal fluid collections. Open wound in the ventral right upper abdominal wall. Mild dependent superficial edema in the low back and lateral gluteal regions. Musculoskeletal: No aggressive appearing focal osseous lesions. IMPRESSION: 1. Diffuse dilatation of the small greater the large bowel with fluid levels throughout the small and large bowel, most compatible with adynamic ileus in the postoperative state. No bowel wall thickening. No discrete small bowel caliber transition to suggest obstruction. 2. Pneumoperitoneum in the right upper quadrant is presumably due to recent surgery . 3. Small postoperative fluid collection at the right adrenalectomy site without wall thickening or internal gas. 4. Small dependent right pleural effusion. 5. Patchy bibasilar lung consolidation and ground-glass attenuation with some volume loss, concerning for multilobar pneumonia and/or aspiration with some atelectasis. Electronically Signed   By: Ilona Sorrel M.D.   On: 05/13/2017  18:01     STUDIES:  CT A / P 7/18 > adynamic ileus, pneumoperitoneum, small post operative fluid collection at right adrenalectomy site without wall thickening or gas, small right pleural effusion.  CULTURES: Abdominal wound 7/18 >   ANTIBIOTICS: Vanc 7/17 > Cefepime 7/17 >   SIGNIFICANT EVENTS: 7/11 > to OR for lap converted to open right adrenalectomy, SBR with anastomosis, placement of wound vac. 7/18  > found to have leak > taken back to OR for redo of ex-lap.  Returned to ICU on vent.  LINES/TUBES: ETT 7/18 >   DISCUSSION: 56 y.o. 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  Taken to OR for right adrenalectomy, SBR with anastomosis and placement of wound vac.  7/18 developed small leak so taken back to OR for re-do of ex-lap and repair of leak.  Returned to ICU on vent.  ASSESSMENT / PLAN:  PULMONARY A: Respiratory insufficiency - due to inability to wean from vent post op. Tobacco dependence. P:   Full vent support. Wean as able. VAP prevention measures. SBT in AM though no plans for extubation until touch base with surgery regarding future trips to OR / closure of abdomen. CXR in AM. Tobacco cessation counseling.  CARDIOVASCULAR A:  Sinus tachycardia. Hx HTN. P:  Continue preadmission clonidine. Labetalol PRN. Hold preadmission atorvastatin, chlorthalidone, labetalol, valsartan.  RENAL  A:   Hypokalemia. P:   40 mEq K per tube now. Repeat BMP now and in AM.  GASTROINTESTINAL A:   S/p re-do of ex-lap post right adrenalectomy. GERD. Nutrition. P:   Post op care per CCS. SUP: Pantoprazole. NPO.  HEMATOLOGIC A:   Anemia - chronic. VTE Prophylaxis. P:  Transfuse for Hgb < 7. SCD's / lovenox. CBC in AM.  INFECTIOUS A:   At risk for peritonitis due to intrabdominal leak. P:   Abx as above (vanc / cefepime).  Follow cultures as above.  ENDOCRINE A:   Right adrenal mass - s/p adrenalectomy 7/11. Hx  hypothyroidism. P:   Assess TSH.  NEUROLOGIC A:   Acute encephalopathy - due to sedation. Hx anxiety. P:   Sedation:  Propofol gtt / Fentanyl PRN.  RASS goal: 0 to -1. Daily WUA. Hold preadmission gabapentin, lorazepam, methocarbamol, percocet.  Family updated: Wife updated by me.  Interdisciplinary Family Meeting v Palliative Care Meeting:  Due by: 05/19/17.  CC time: 35 min.   Montey Hora, [facility] Pager: [phone]  or [phone] 05/13/2017, 9:21 PM

## 2017-05-13 NOTE — Anesthesia Postprocedure Evaluation (Signed)
Anesthesia Post Note  Patient: [name] [name]  Procedure(s) Performed: Procedure(s) (LRB): EXPLORATORY LAPAROTOMY, with wound dehisense, and repair of anastamotic leak (N/A)     Patient location during evaluation: ICU Anesthesia Type: General Level of consciousness: sedated Pain management: pain level controlled Vital Signs Assessment: post-procedure vital signs reviewed and stable Respiratory status: patient remains intubated per anesthesia plan Cardiovascular status: blood pressure returned to baseline and stable Postop Assessment: no signs of nausea or vomiting Anesthetic complications: no    Last Vitals:  Vitals:   05/13/17 2130 05/13/17 2145  BP:    Pulse: 85 84  Resp: (!) 9 (!) 2  Temp:      Last Pain:  Vitals:   05/13/17 1455  TempSrc: Oral  PainSc:    Pain Goal: Patients Stated Pain Goal: 2 (05/13/17 0544)               Evelene Roussin

## 2017-05-13 NOTE — Consult Note (Signed)
[place] Nurse wound follow up Wound type: surgical full thickness Measurement:19cm x 5cm x 2cm with a 2cm shelf on the proximal side of wound from the center of the wound (12 o'clock) to the medial end of wound. At the 12 o'clock area there is a 4cm tunnel within the shelf as well as a 7cm deep tract that [name], [place] for [facility] put her finger in and said was gaseous.  Wound bed:80% black and tan slough, 20% red granulating (granulation is on the lateral portion of the wound) Drainage (amount, consistency, odor) copious, foul smelling, gray purulent Periwound:intact with darkened red area around wound from foam from NPWT dressing that was just removed.  Dressing procedure/placement/frequency: I was stopped in hallway by bedside nurse and asked if I was seeing this pt.  The consult and NPWT dressing was done Friday, 7/13 and orders written for NPWT dressing changes M-W-F by bedside RN. This RN stated she thought [place] team was doing the dressing changes so she has not completed it. She states that the odor we smell in the hallway is from his wound even though vac is intact with suction, no leakage at all. I noted on the dressing that it was dated Friday 7/13 by [place] RN.  The dressing was not changed Monday for whatever reason. I removed NPWT dressing and wound has deteriorated with foul odor and necrotic tissue, odor smells like stool. [name], [place] for [facility] was in room next door to pt and I asked her to step in to see wound. She took a picture and loaded to chart and notified physician who states he will be in to see patient.  Pt to be kept NPO as physician feels like he may have to take him back to OR today. [name], PA states to do wet to dry dressings and discontinue NPWT dressing. We will follow this patient and remain available to this patient, nursing, and the medical and surgical teams.  Fara Olden, RN-C, WTA-C, [facility] Wound Treatment Associate

## 2017-05-13 NOTE — Progress Notes (Signed)
[facility] Progress Note Patient Name: [name] [name] DOB: December 08, 1960 MRN: [id number]   Date of Service  05/13/2017  HPI/Events of Note  Increased agitation   eICU Interventions  Will order fentanyl infusion     Intervention Category Evaluation Type: Other  Dorothymae Maciver 05/13/2017, 11:12 PM

## 2017-05-13 NOTE — Anesthesia Preprocedure Evaluation (Signed)
Anesthesia Evaluation  Patient identified by MRN, date of birth, ID band Patient awake    Reviewed: Allergy & Precautions, H&P , NPO status , Patient's Chart, lab work & pertinent test results  History of Anesthesia Complications Negative for: history of anesthetic complications  Airway Mallampati: II   Neck ROM: full    Dental  (+) Teeth Intact   Pulmonary Current Smoker,  Has cough , on antiibiotics   breath sounds clear to auscultation       Cardiovascular hypertension,  Rhythm:Regular Rate:Normal     Neuro/Psych  Headaches,    GI/Hepatic GERD  ,  Endo/Other    Renal/GU      Musculoskeletal  (+) Arthritis ,   Abdominal   Peds  Hematology  (+) anemia ,   Anesthesia Other Findings   Reproductive/Obstetrics                             Anesthesia Physical  Anesthesia Plan  ASA: III  Anesthesia Plan: General   Post-op Pain Management:    Induction: Intravenous  PONV Risk Score and Plan: 3 and Ondansetron, Dexamethasone, Propofol, Midazolam and Treatment may vary due to age or medical condition  Airway Management Planned: Oral ETT  Additional Equipment: Arterial line  Intra-op Plan:   Post-operative Plan: Extubation in OR  Informed Consent: I have reviewed the patients History and Physical, chart, labs and discussed the procedure including the risks, benefits and alternatives for the proposed anesthesia with the patient or authorized representative who has indicated his/her understanding and acceptance.   Dental advisory given  Plan Discussed with: CRNA and Surgeon  Anesthesia Plan Comments:         Anesthesia Quick Evaluation

## 2017-05-13 NOTE — Progress Notes (Signed)
Notified MD McQuaid in regards to patient having large NG output (approx 721ml) and also LUQ bruise.

## 2017-05-13 NOTE — Anesthesia Procedure Notes (Addendum)
Procedure Name: Intubation Date/Time: 05/13/2017 6:46 PM Performed by: Lissa Morales Pre-anesthesia Checklist: Patient identified, Emergency Drugs available, Suction available and Patient being monitored Patient Re-evaluated:Patient Re-evaluated prior to induction Oxygen Delivery Method: Circle system utilized Preoxygenation: Pre-oxygenation with 100% oxygen Induction Type: IV induction, Rapid sequence and Cricoid Pressure applied Laryngoscope Size: Glidescope and 4 Grade View: Grade II Tube type: Oral Tube size: 8.5 (taper ETT) mm Number of attempts: 1 Airway Equipment and Method: Stylet and Video-laryngoscopy Placement Confirmation: ETT inserted through vocal cords under direct vision,  positive ETCO2 and breath sounds checked- equal and bilateral Secured at: 24 cm Tube secured with: Tape Dental Injury: Teeth and Oropharynx as per pre-operative assessment  Comments: Elected glidescope 2ndary to  3 level cervical fusion MAP 3 RSI. Good visualization with glidescope

## 2017-05-13 NOTE — Transfer of Care (Signed)
Immediate Anesthesia Transfer of Care Note  Patient: [name] [name]  Procedure(s) Performed: Procedure(s): EXPLORATORY LAPAROTOMY, with wound dehisense, and repair of anastamotic leak (N/A)  Patient Location: PACU  Anesthesia Type:General  Level of Consciousness: Patient remains intubated per anesthesia plan  Airway & Oxygen Therapy: Patient remains intubated per anesthesia plan  Post-op Assessment: Report given to RN  Post vital signs: stable  Last Vitals:  Vitals:   05/13/17 1242 05/13/17 1455  BP: 130/84 (!) 139/91  Pulse: 93 95  Resp:  18  Temp:  36.7 C    Last Pain:  Vitals:   05/13/17 1455  TempSrc: Oral  PainSc:       Patients Stated Pain Goal: 2 (91/91/66 0600)  Complications: No apparent anesthesia complications

## 2017-05-14 ENCOUNTER — Inpatient Hospital Stay (HOSPITAL_COMMUNITY): Payer: Medicare Other | Admitting: Anesthesiology

## 2017-05-14 ENCOUNTER — Encounter (HOSPITAL_COMMUNITY)
Admission: RE | Disposition: A | Discharge status: Home Health | Payer: Self-pay | Source: Ambulatory Visit | Attending: Surgery

## 2017-05-14 ENCOUNTER — Inpatient Hospital Stay (HOSPITAL_COMMUNITY): Payer: Medicare Other

## 2017-05-14 ENCOUNTER — Encounter (HOSPITAL_COMMUNITY): Payer: Self-pay | Admitting: General Surgery

## 2017-05-14 DIAGNOSIS — J690 Pneumonitis due to inhalation of food and vomit: Secondary | ICD-10-CM

## 2017-05-14 DIAGNOSIS — T8131XA Disruption of external operation (surgical) wound, not elsewhere classified, initial encounter: Secondary | ICD-10-CM

## 2017-05-14 DIAGNOSIS — K9189 Other postprocedural complications and disorders of digestive system: Secondary | ICD-10-CM

## 2017-05-14 DIAGNOSIS — A419 Sepsis, unspecified organism: Secondary | ICD-10-CM

## 2017-05-14 DIAGNOSIS — E876 Hypokalemia: Secondary | ICD-10-CM

## 2017-05-14 DIAGNOSIS — R6520 Severe sepsis without septic shock: Secondary | ICD-10-CM

## 2017-05-14 HISTORY — PX: LAPAROTOMY: SHX154

## 2017-05-14 LAB — BLOOD GAS, ARTERIAL
ACID-BASE EXCESS: 3.9 mmol/L — AB (ref 0.0–2.0)
Bicarbonate: 27 mmol/L (ref 20.0–28.0)
DRAWN BY: 232811
FIO2: 60
LHR: 16 {breaths}/min
O2 SAT: 95.8 %
PATIENT TEMPERATURE: 102.8
PCO2 ART: 40.5 mmHg (ref 32.0–48.0)
PEEP: 5 cmH2O
PH ART: 7.45 (ref 7.350–7.450)
PO2 ART: 90.3 mmHg (ref 83.0–108.0)
VT: 580 mL

## 2017-05-14 LAB — CBC
HEMATOCRIT: 24.7 % — AB (ref 39.0–52.0)
HEMOGLOBIN: 8.8 g/dL — AB (ref 13.0–17.0)
MCH: 27.8 pg (ref 26.0–34.0)
MCHC: 35.6 g/dL (ref 30.0–36.0)
MCV: 78.2 fL (ref 78.0–100.0)
Platelets: 467 10*3/uL — ABNORMAL HIGH (ref 150–400)
RBC: 3.16 MIL/uL — ABNORMAL LOW (ref 4.22–5.81)
RDW: 14.1 % (ref 11.5–15.5)
WBC: 22.8 10*3/uL — AB (ref 4.0–10.5)

## 2017-05-14 LAB — POCT I-STAT 7, (LYTES, BLD GAS, ICA,H+H)
Acid-Base Excess: 4 mmol/L — ABNORMAL HIGH (ref 0.0–2.0)
Bicarbonate: 29.3 mmol/L — ABNORMAL HIGH (ref 20.0–28.0)
Calcium, Ion: 1.11 mmol/L — ABNORMAL LOW (ref 1.15–1.40)
HCT: 26 % — ABNORMAL LOW (ref 39.0–52.0)
HEMOGLOBIN: 8.8 g/dL — AB (ref 13.0–17.0)
O2 Saturation: 100 %
PCO2 ART: 49.4 mmHg — AB (ref 32.0–48.0)
PO2 ART: 187 mmHg — AB (ref 83.0–108.0)
Potassium: 3.5 mmol/L (ref 3.5–5.1)
Sodium: 141 mmol/L (ref 135–145)
TCO2: 31 mmol/L (ref 0–100)
pH, Arterial: 7.382 (ref 7.350–7.450)

## 2017-05-14 LAB — BASIC METABOLIC PANEL
ANION GAP: 7 (ref 5–15)
BUN: 13 mg/dL (ref 6–20)
CHLORIDE: 104 mmol/L (ref 101–111)
CO2: 27 mmol/L (ref 22–32)
Calcium: 7.6 mg/dL — ABNORMAL LOW (ref 8.9–10.3)
Creatinine, Ser: 0.92 mg/dL (ref 0.61–1.24)
GFR calc Af Amer: >60 mL/min (ref >60)
GFR calc non Af Amer: >60 mL/min (ref >60)
Glucose, Bld: 129 mg/dL — ABNORMAL HIGH (ref 65–99)
Potassium: 3.8 mmol/L (ref 3.5–5.1)
SODIUM: 138 mmol/L (ref 135–145)

## 2017-05-14 LAB — PREPARE RBC (CROSSMATCH)

## 2017-05-14 LAB — GLUCOSE, CAPILLARY
GLUCOSE-CAPILLARY: 119 mg/dL — AB (ref 65–99)
GLUCOSE-CAPILLARY: 109 mg/dL — AB (ref 65–99)
Glucose-Capillary: 125 mg/dL — ABNORMAL HIGH (ref 65–99)

## 2017-05-14 LAB — MAGNESIUM: Magnesium: 1.7 mg/dL (ref 1.7–2.4)

## 2017-05-14 LAB — PHOSPHORUS: PHOSPHORUS: 2.9 mg/dL (ref 2.5–4.6)

## 2017-05-14 LAB — TSH: TSH: 0.552 u[IU]/mL (ref 0.350–4.500)

## 2017-05-14 SURGERY — LAPAROTOMY, EXPLORATORY
Anesthesia: General | Site: Abdomen

## 2017-05-14 MED ORDER — PROPOFOL 10 MG/ML IV BOLUS
INTRAVENOUS | Status: AC
Start: 2017-05-14 — End: ?
  Filled 2017-05-14: qty 40

## 2017-05-14 MED ORDER — PROPOFOL 10 MG/ML IV BOLUS
INTRAVENOUS | Status: AC
  Filled 2017-05-14: qty 20

## 2017-05-14 MED ORDER — FENTANYL CITRATE (PF) 100 MCG/2ML IJ SOLN
INTRAMUSCULAR | Status: AC
  Filled 2017-05-14: qty 2

## 2017-05-14 MED ORDER — ACETAMINOPHEN 325 MG PO TABS
650.0000 mg | ORAL_TABLET | ORAL | Status: DC | PRN
  Administered 2017-05-14: 650 mg via ORAL
  Filled 2017-05-14: qty 2

## 2017-05-14 MED ORDER — LACTATED RINGERS IV SOLN
INTRAVENOUS | Status: DC | PRN
  Administered 2017-05-14 (×3): via INTRAVENOUS

## 2017-05-14 MED ORDER — SUFENTANIL CITRATE 50 MCG/ML IV SOLN
INTRAVENOUS | Status: AC
  Filled 2017-05-14: qty 2

## 2017-05-14 MED ORDER — SUFENTANIL CITRATE 50 MCG/ML IV SOLN
INTRAVENOUS | Status: DC | PRN
  Administered 2017-05-14: 15 ug via INTRAVENOUS
  Administered 2017-05-14: 20 ug via INTRAVENOUS
  Administered 2017-05-14: 10 ug via INTRAVENOUS
  Administered 2017-05-14: 20 ug via INTRAVENOUS
  Administered 2017-05-14 (×5): 10 ug via INTRAVENOUS
  Administered 2017-05-14: 15 ug via INTRAVENOUS
  Administered 2017-05-14: 20 ug via INTRAVENOUS

## 2017-05-14 MED ORDER — SODIUM CHLORIDE 0.9 % IV BOLUS (SEPSIS)
1000.0000 mL | Freq: Once | INTRAVENOUS | Status: AC
  Administered 2017-05-14: 1000 mL via INTRAVENOUS

## 2017-05-14 MED ORDER — LACTATED RINGERS IV SOLN
INTRAVENOUS | Status: DC | PRN
  Administered 2017-05-14: 17:00:00 via INTRAVENOUS

## 2017-05-14 MED ORDER — DEXAMETHASONE SODIUM PHOSPHATE 10 MG/ML IJ SOLN
INTRAMUSCULAR | Status: AC
  Filled 2017-05-14: qty 1

## 2017-05-14 MED ORDER — INSULIN ASPART 100 UNIT/ML ~~LOC~~ SOLN
0.0000 [IU] | SUBCUTANEOUS | Status: DC
  Administered 2017-05-14 – 2017-05-17 (×13): 2 [IU] via SUBCUTANEOUS
  Administered 2017-05-18: 3 [IU] via SUBCUTANEOUS
  Administered 2017-05-18 – 2017-05-20 (×5): 2 [IU] via SUBCUTANEOUS

## 2017-05-14 MED ORDER — ARTIFICIAL TEARS OP OINT
TOPICAL_OINTMENT | OPHTHALMIC | Status: AC
  Filled 2017-05-14: qty 3.5

## 2017-05-14 MED ORDER — MIDAZOLAM HCL 2 MG/2ML IJ SOLN
INTRAMUSCULAR | Status: DC | PRN
  Administered 2017-05-14 (×2): 1 mg via INTRAVENOUS

## 2017-05-14 MED ORDER — 0.9 % SODIUM CHLORIDE (POUR BTL) OPTIME
TOPICAL | Status: DC | PRN
  Administered 2017-05-14 (×2): 2000 mL

## 2017-05-14 MED ORDER — TRACE MINERALS CR-CU-MN-SE-ZN 10-1000-500-60 MCG/ML IV SOLN
INTRAVENOUS | Status: DC
  Filled 2017-05-14: qty 960

## 2017-05-14 MED ORDER — ANIDULAFUNGIN 100 MG IV SOLR
200.0000 mg | Freq: Once | INTRAVENOUS | Status: AC
  Administered 2017-05-14: 200 mg via INTRAVENOUS
  Filled 2017-05-14: qty 200

## 2017-05-14 MED ORDER — SUFENTANIL CITRATE 50 MCG/ML IV SOLN
INTRAVENOUS | Status: AC
  Filled 2017-05-14: qty 1

## 2017-05-14 MED ORDER — MIDAZOLAM HCL 2 MG/2ML IJ SOLN
INTRAMUSCULAR | Status: AC
  Filled 2017-05-14: qty 2

## 2017-05-14 MED ORDER — ACETAMINOPHEN 10 MG/ML IV SOLN
INTRAVENOUS | Status: AC
  Filled 2017-05-14: qty 100

## 2017-05-14 MED ORDER — ROCURONIUM BROMIDE 10 MG/ML (PF) SYRINGE
PREFILLED_SYRINGE | INTRAVENOUS | Status: DC | PRN
  Administered 2017-05-14 (×3): 50 mg via INTRAVENOUS

## 2017-05-14 MED ORDER — SODIUM CHLORIDE 0.9 % IV SOLN
100.0000 mg | INTRAVENOUS | Status: DC
  Administered 2017-05-15: 100 mg via INTRAVENOUS
  Filled 2017-05-14 (×2): qty 100

## 2017-05-14 MED ORDER — SODIUM CHLORIDE 0.9 % IV SOLN
Freq: Once | INTRAVENOUS | Status: AC
  Administered 2017-05-14: 10 mL via INTRAVENOUS

## 2017-05-14 MED ORDER — ACETAMINOPHEN 10 MG/ML IV SOLN
INTRAVENOUS | Status: DC | PRN
  Administered 2017-05-14: 1000 mg via INTRAVENOUS

## 2017-05-14 SURGICAL SUPPLY — 44 items
BLADE EXTENDED COATED 6.5IN (ELECTRODE) ×2 IMPLANT
CHLORAPREP W/TINT 26ML (MISCELLANEOUS) ×2 IMPLANT
COVER MAYO STAND STRL (DRAPES) ×2 IMPLANT
COVER SURGICAL LIGHT HANDLE (MISCELLANEOUS) ×2 IMPLANT
DRAPE LAPAROSCOPIC ABDOMINAL (DRAPES) ×2 IMPLANT
DRAPE UTILITY XL STRL (DRAPES) ×2 IMPLANT
DRAPE WARM FLUID 44X44 (DRAPE) ×2 IMPLANT
DRSG OPSITE POSTOP 4X10 (GAUZE/BANDAGES/DRESSINGS) IMPLANT
DRSG OPSITE POSTOP 4X6 (GAUZE/BANDAGES/DRESSINGS) IMPLANT
DRSG OPSITE POSTOP 4X8 (GAUZE/BANDAGES/DRESSINGS) IMPLANT
ELECT BLADE 6.5 EXT (BLADE) ×2 IMPLANT
ELECT REM PT RETURN 15FT ADLT (MISCELLANEOUS) ×2 IMPLANT
GLOVE BIOGEL PI IND STRL 7.0 (GLOVE) ×1 IMPLANT
GLOVE BIOGEL PI INDICATOR 7.0 (GLOVE) ×1
GLOVE SURG SS PI 7.0 STRL IVOR (GLOVE) ×2 IMPLANT
GOWN STRL REUS W/TWL LRG LVL3 (GOWN DISPOSABLE) ×2 IMPLANT
GOWN STRL REUS W/TWL XL LVL3 (GOWN DISPOSABLE) ×2 IMPLANT
HANDLE SUCTION POOLE (INSTRUMENTS) ×1 IMPLANT
KIT BASIN OR (CUSTOM PROCEDURE TRAY) ×2 IMPLANT
PACK GENERAL/GYN (CUSTOM PROCEDURE TRAY) ×2 IMPLANT
RELOAD PROXIMATE 100 BLUE (MISCELLANEOUS) IMPLANT
RELOAD PROXIMATE 100MM BLUE (MISCELLANEOUS)
RELOAD PROXIMATE 75MM BLUE (ENDOMECHANICALS) ×2 IMPLANT
SEALER TISSUE X1 CVD JAW (INSTRUMENTS) IMPLANT
SPONGE ABDOMINAL VAC ABTHERA (MISCELLANEOUS) ×2 IMPLANT
SPONGE LAP 18X18 X RAY DECT (DISPOSABLE) IMPLANT
STAPLER PROXIMATE 100MM BLUE (MISCELLANEOUS) IMPLANT
STAPLER PROXIMATE 75MM BLUE (STAPLE) ×2 IMPLANT
STAPLER VISISTAT 35W (STAPLE) ×2 IMPLANT
SUCTION POOLE HANDLE (INSTRUMENTS) ×2
SUT MNCRL AB 4-0 PS2 18 (SUTURE) IMPLANT
SUT PDS AB 0 CT1 36 (SUTURE) IMPLANT
SUT PROLENE 2 0 SH DA (SUTURE) ×2 IMPLANT
SUT SILK 2 0 (SUTURE) ×1
SUT SILK 2 0 SH CR/8 (SUTURE) ×2 IMPLANT
SUT SILK 2-0 18XBRD TIE 12 (SUTURE) ×1 IMPLANT
SUT SILK 3 0 (SUTURE) ×1
SUT SILK 3 0 SH CR/8 (SUTURE) ×2 IMPLANT
SUT SILK 3-0 18XBRD TIE 12 (SUTURE) ×1 IMPLANT
TOWEL OR 17X26 10 PK STRL BLUE (TOWEL DISPOSABLE) ×2 IMPLANT
TOWEL OR NON WOVEN STRL DISP B (DISPOSABLE) ×2 IMPLANT
TRAY FOLEY W/METER SILVER 14FR (SET/KITS/TRAYS/PACK) ×2 IMPLANT
TRAY FOLEY W/METER SILVER 16FR (SET/KITS/TRAYS/PACK) ×2 IMPLANT
YANKAUER SUCT BULB TIP NO VENT (SUCTIONS) ×2 IMPLANT

## 2017-05-14 NOTE — Progress Notes (Signed)
Pharmacy: Re- TPN  Patient's nurse reported that IV team will not be able to place PICC today (05/14/17).    Will NOT start TPN today.  F/u with PICC placement on 7/20 and will start TPN once access is established.  Dia Sitter, PharmD, BCPS 05/14/2017 4:14 PM

## 2017-05-14 NOTE — Progress Notes (Signed)
[facility] Progress Note Patient Name: [name] [name] DOB: 11/03/1960 MRN: [id number]   Date of Service  05/14/2017  HPI/Events of Note  Low BP  eICU Interventions  1 L NS bolus to be given     Intervention Category Evaluation Type: Other  Grae Cannata 05/14/2017, 6:08 AM

## 2017-05-14 NOTE — Transfer of Care (Signed)
Immediate Anesthesia Transfer of Care Note  Patient: HASSANI SLINEY  Procedure(s) Performed: Procedure(s): EXPLORATORY LAPAROTOMY, LYSIS OF ADHESIONS/ SMALL BOWEL RESECTION/ WASHOUT OF ABDOMEN AND PLACEMENT OF NEGATIVE PRESSURE DRESSING (N/A)  Patient Location: ICU  Anesthesia Type:General  Level of Consciousness: sedated and Patient remains intubated per anesthesia plan  Airway & Oxygen Therapy: Patient remains intubated per anesthesia plan  Post-op Assessment: Report given to RN and Post -op Vital signs reviewed and stable  Post vital signs: stable  Last Vitals:  Vitals:   05/14/17 1600 05/14/17 1933  BP:    Pulse: (!) 109 (!) 124  Resp: (!) 27 (!) 23  Temp:      Last Pain:  Vitals:   05/14/17 1550  TempSrc: Axillary  PainSc:       Patients Stated Pain Goal: 2 (99/35/70 1779)  Complications: No apparent anesthesia complications

## 2017-05-14 NOTE — Anesthesia Preprocedure Evaluation (Signed)
Anesthesia Evaluation  Patient identified by MRN, date of birth, ID band Patient awake    Reviewed: Allergy & Precautions, H&P , NPO status , Patient's Chart, lab work & pertinent test results  History of Anesthesia Complications Negative for: history of anesthetic complications  Airway Mallampati: Intubated   Neck ROM: full    Dental  (+) Teeth Intact   Pulmonary Current Smoker,  Has cough , on antiibiotics   breath sounds clear to auscultation       Cardiovascular hypertension,  Rhythm:Regular Rate:Normal     Neuro/Psych  Headaches,    GI/Hepatic GERD  ,  Endo/Other    Renal/GU      Musculoskeletal  (+) Arthritis ,   Abdominal   Peds  Hematology  (+) anemia ,   Anesthesia Other Findings   Reproductive/Obstetrics                             Lab Results  Component Value Date   WBC 22.8 (H) 05/14/2017   HGB 8.8 (L) 05/14/2017   HCT 24.7 (L) 05/14/2017   MCV 78.2 05/14/2017   PLT 467 (H) 05/14/2017   Lab Results  Component Value Date   CREATININE 0.92 05/14/2017   BUN 13 05/14/2017   NA 138 05/14/2017   K 3.8 05/14/2017   CL 104 05/14/2017   CO2 27 05/14/2017    Anesthesia Physical  Anesthesia Plan  ASA: IV and emergent  Anesthesia Plan: General   Post-op Pain Management:    Induction: Intravenous  PONV Risk Score and Plan: 3 and Ondansetron, Dexamethasone, Propofol, Midazolam and Treatment may vary due to age or medical condition  Airway Management Planned: Oral ETT  Additional Equipment: Arterial line  Intra-op Plan:   Post-operative Plan: Post-operative intubation/ventilation  Informed Consent: I have reviewed the patients History and Physical, chart, labs and discussed the procedure including the risks, benefits and alternatives for the proposed anesthesia with the patient or authorized representative who has indicated his/her understanding and acceptance.    Dental advisory given  Plan Discussed with: CRNA and Surgeon  Anesthesia Plan Comments:         Anesthesia Quick Evaluation

## 2017-05-14 NOTE — Progress Notes (Signed)
[facility] Progress Note Patient Name: [name] [name] DOB: 12-Jul-1961 MRN: [id number]   Date of Service  05/14/2017  HPI/Events of Note  Fever is 102  eICU Interventions  Blood cultures and tylenol ordered        Flora Lipps 05/14/2017, 4:43 AM

## 2017-05-14 NOTE — Op Note (Signed)
Preoperative diagnosis: intestinal leak  Postoperative diagnosis: ischemic intestine with leak  Procedure: reopening of recent laparotomy, lysis of adhesions, small bowel resection, placement 200cm^2 negative pressure dressing  Surgeon: Gurney Maxin, M.D.  Asst: Neysa Bonito  Anesthesia: General  Indications for procedure: [name] [name] is a 56 y.o. year old male underwent anastomotic leak repair and then had additional succus present through his abdominal wound. Subsequently he was urgently taken to the operating room.  Description of procedure: The patient was brought into the operative suite. Anesthesia was administered with General endotracheal anesthesia. WHO checklist was applied. The patient was then placed in supine position. The area was prepped and draped in the usual sterile fashion.  The dressing was taken down and brown succus was seen in the wound. The 0 PDS from previous closure had partially pulled through the muscle and fascia especially in the medial aspect of the wound. These sutures were cut and removed. On inspection of the previous repair there was no leakage in that area. Further dissection showed a new leak on a different piece of bowel.  Careful dissection was performed in order to free this piece of bowel from dense adhesions of the abdominal wall. Due to the level of adhesions, the adhesions over the midline of the upper abdomen were dissected free using blunt and sharp dissection in order to find healthy bowel with a better plane for attack. The incision was extended in the midline to allow this to occur safely. In doing this we were able to identify the piece of injured intestine where it came up to the midline abdomen and successfully dissected it free. In doing so there was one full thickness injury made to the intestine approximately 5cm from the perforation site. Once the loop of intestine was traced and path identified a 63mm GIA stapler was used to divide each  limb of this loop of intestine. A 2-0 prolene was placed on the superior limb on the mesenteric side. A 3-0 silk was placed on the inferior limb on the mesenteric side.   Decision was made not to proceed with anastomosis or fascia closure but rather temporary vac closure device and planned reoperation in 72h. Further dissection of the abdominal contents away from the abdominal wall was performed. The stomach was identified and OG tube confirmed to be in good position.  The wound appeared to be approximately 20cm by 10cmThe blue vac sponge was put in place first with the covered sponge covering the abdominal contents, then a second sponge placed under the fascia of the wound and the third sponge covering the fascia and subcutaneous areas.  Findings: ischemic perforation of loop of intestine, intact repair of previous anastomotic leak repair, intact anastomosis  Specimen: small intestine  Implant: temporary abdominal closure sponge vac   Blood loss: 227ml  Local anesthesia: none  Complications: inadvertent injury to intestines during lysis of adhesions near the point of intestinal perforation  Disposition: The patient will go to the ICU remain intubated and plan to return to the operating room in 48-72 hours  Gurney Maxin, M.D. General, Bariatric, & Minimally Invasive Surgery [facility]

## 2017-05-14 NOTE — Progress Notes (Signed)
Progress Note: General Surgery Service   Assessment/Plan: Patient Active Problem List   Diagnosis Date Noted  . Chronic narcotic use 05/10/2017  . Anxiety state 05/10/2017  . Intra-abdominal adhesions s/p SB resection 05/06/2017 05/09/2017  . 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) 05/06/2017  . Right adrenal mass s/p adrenalectomy 05/06/2017 05/06/2017  . Throat symptom 03/18/2016  . Multinodular goiter 01/19/2016  . Hyperthyroidism 01/19/2016  . Essential hypertension   . Diarrhea 11/30/2014  . Anemia, iron deficiency 11/01/2014  . Leukocytosis 11/03/2013  . Tobacco abuse 07/26/2013  . COPD (chronic obstructive pulmonary disease) ([place]) 07/26/2013  . Left knee pain 07/26/2013  . Pain in joint, shoulder region 05/03/2013  . GERD 07/24/2008  . 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 -discussed case with Dr. Johney Maine, we both agree proceeding with a damage control type operation is a good course of action given his septic appearance -continue abx -TPN -PICC line, possible need for hemodynamic support -place OG tube   LOS: 8 days  Chief Complaint/Subjective: Febrile overnight, pulled his OG tube, odorous drainage from RUQ wound  Objective: Vital signs in last 24 hours: Temp:  [98 F (36.7 C)-102.8 F (39.3 C)] 100.6 F (38.1 C) (07/19 0800) Pulse Rate:  [84-123] 97 (07/19 1100) Resp:  [2-32] 26 (07/19 1100) BP: (102-164)/(44-91) 102/44 (07/19 0100) SpO2:  [88 %-100 %] 93 % (07/19 1100) Arterial Line BP: (81-206)/(42-94) 97/48 (07/19 1100) FiO2 (%):  [60 %-80 %] 70 % (07/19 1037) Weight:  [98 kg (216 lb 0.8 oz)] 98 kg (216 lb 0.8 oz) (07/18 2100) Last BM Date: 05/11/17  Intake/Output from previous day: 07/18 0701 -  07/19 0700 In: 8005.4 [I.V.:7125.4; NG/GT:30; IV Piggyback:850] Out: 2195 [Urine:785; Emesis/NG output:1300; Drains:60; Blood:50] Intake/Output this shift: Total I/O In: 1477.6 [I.V.:417.6; IV Piggyback:1060] Out: -   Lungs: coarse b/l, assisted  Cardiovascular: tachycardic  Abd: moderate distension, wound with odorous brown liquid seen  Neuro: sedated, arousable  Lab Results: CBC   Recent Labs  05/13/17 2109 05/14/17 0348  WBC 23.1* 22.8*  HGB 9.1* 8.8*  HCT 25.9* 24.7*  PLT 512* 467*   BMET  Recent Labs  05/13/17 2109 05/14/17 0348  NA 138 138  K 4.2 3.8  CL 103 104  CO2 28 27  GLUCOSE 132* 129*  BUN 13 13  CREATININE 0.73 0.92  CALCIUM 7.8* 7.6*   PT/INR No results for input(s): LABPROT, INR in the last 72 hours. ABG  Recent Labs  05/13/17 2140 05/14/17 0440  PHART 7.323* 7.450  HCO3 28.1* 27.0    Studies/Results:  Anti-infectives: Anti-infectives    Start     Dose/Rate Route Frequency Ordered Stop   05/15/17 1000  anidulafungin (ERAXIS) 100 mg in sodium chloride 0.9 % 100 mL IVPB     100 mg 78 mL/hr over 100 Minutes Intravenous Every 24 hours 05/14/17 0829     05/14/17 0900  anidulafungin (ERAXIS) 200 mg in sodium chloride 0.9 % 200 mL IVPB     200 mg 78 mL/hr over 200 Minutes Intravenous  Once 05/14/17 0829     05/13/17 1927  vancomycin (VANCOCIN) 1-5 GM/200ML-% IVPB    Comments:  Ward, Christa   : cabinet override      05/13/17 1927 05/14/17 0744   05/12/17 1400  ceFEPIme (  MAXIPIME) 1 g in dextrose 5 % 50 mL IVPB     1 g 100 mL/hr over 30 Minutes Intravenous Every 8 hours 05/12/17 0939     05/12/17 0900  vancomycin (VANCOCIN) IVPB 1000 mg/200 mL premix     1,000 mg 200 mL/hr over 60 Minutes Intravenous Every 12 hours 05/12/17 0750     05/12/17 0830  aztreonam (AZACTAM) 2 GM IVPB     2 g 100 mL/hr over 30 Minutes Intravenous  Once 05/12/17 0800 05/12/17 0957   05/06/17 0916  vancomycin (VANCOCIN) IVPB 1000 mg/200 mL premix     1,000  mg 200 mL/hr over 60 Minutes Intravenous On call to O.R. 05/06/17 0916 05/06/17 1229      Medications: Scheduled Meds: . bisacodyl  10 mg Rectal Daily  . chlorhexidine gluconate (MEDLINE KIT)  15 mL Mouth Rinse BID  . enoxaparin (LOVENOX) injection  40 mg Subcutaneous Q24H  . fentaNYL (SUBLIMAZE) injection  50 mcg Intravenous Once  . ipratropium-albuterol  3 mL Nebulization Q6H  . lip balm  1 application Topical BID  . mouth rinse  15 mL Mouth Rinse QID  . nicotine  21 mg Transdermal Daily  . pantoprazole (PROTONIX) IV  40 mg Intravenous Q24H  . thiamine  100 mg Oral Daily   Continuous Infusions: . anidulafungin 200 mg (05/14/17 1005)   Followed by  . [START ON 05/15/2017] anidulafungin    . ceFEPime (MAXIPIME) IV Stopped (05/14/17 0601)  . dextrose 5 % and 0.45 % NaCl with KCl 20 mEq/L 100 mL/hr at 05/14/17 1000  . fentaNYL infusion INTRAVENOUS 325 mcg/hr (05/14/17 1016)  . propofol (DIPRIVAN) infusion 25 mcg/kg/min (05/14/17 1015)  . vancomycin Stopped (05/14/17 1006)   PRN Meds:.acetaminophen, fentaNYL, fentaNYL, fentaNYL (SUBLIMAZE) injection, labetalol, magic mouthwash, midazolam, midazolam, ondansetron **OR** ondansetron (ZOFRAN) IV  Mickeal Skinner, MD Pg# [phone] [facility]

## 2017-05-14 NOTE — Progress Notes (Signed)
IV team made this RN aware that PICC would not be placed today due to patient going to surgery.  PCCM MD and Pharmacy made aware that TPN would not be started today (05/14/2017) due to this; PICC should be placed tomorrow (05/15/2017).  Current IV sites infusing without any signs of difficulty.  Will continue to monitor.

## 2017-05-14 NOTE — Anesthesia Procedure Notes (Addendum)
Performed by: Lissa Morales Oxygen Delivery Method: Circle system utilized Induction Type: Inhalational induction with existing ETT Tube size: 8.5 mm Secured at: 25 cm Tube secured with: Tape Comments: Pt came to OR intubated with 8.5 ETT in place  From  Surgery  05/13/17.  See note.

## 2017-05-14 NOTE — Progress Notes (Signed)
PULMONARY / CRITICAL CARE MEDICINE   Name: [name] [name] MRN: [id number] DOB: [birth year]    ADMISSION DATE:  05/06/2017 CONSULTATION DATE:  05/13/17  REFERRING MD:  Kieth Brightly  CHIEF COMPLAINT:  Vent Management  BRIEF SUMMARY:  [name] [name] is a 56 y.o. 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  On 7/18, he developed a leak therefore was taken back to OR for re-do of ex-lap and repair of leak.  He returned to the ICU on the vent and PCCM was consulted for vent management.   SUBJECTIVE:  RN reports pt pulled NGT tube out, almost self extubated (pulled tube out to 19cm).  ETT repositioned by RT. BP soft with sedation.  Tmax 102.8 in last 24 hours.  I/O - +5.8 L in last 24 hours  VITAL SIGNS: BP (!) 102/44   Pulse (!) 101   Temp 100.3 F (37.9 C) (Axillary)   Resp (!) 26   Ht 5\' 10"  (1.778 m)   Wt 216 lb 0.8 oz (98 kg)   SpO2 96%   BMI 31.00 kg/m   HEMODYNAMICS:    VENTILATOR SETTINGS: Vent Mode: PRVC FiO2 (%):  [60 %-80 %] 60 % Set Rate:  [14 bmp-16 bmp] 16 bmp Vt Set:  [580 mL] 580 mL PEEP:  [5 cmH20] 5 cmH20 Plateau Pressure:  [21 cmH20-24 cmH20] 21 cmH20  INTAKE / OUTPUT: I/O last 3 completed shifts: In: 9530.4 [I.V.:7950.4; NG/GT:30; IV Piggyback:1550] Out: 3070 [Urine:1285; Emesis/NG output:1300; Drains:435; Blood:50]   PHYSICAL EXAMINATION: General: adult male, appears uncomfortable on vent  HEENT: MM pink/moist, ETT, no jvd Neuro: Awakens to voice, nods, intermittently sedate, MAE CV: s1s2 rrr, no m/r/g PULM: even/non-labored, lungs bilaterally clear  GI: abdominal binder in place, abd dressing with small amt serosanguinous drainage, JP drain RLQ Extremities: warm/dry, no edema  Skin: no rashes or lesions  LABS:  BMET  Recent Labs Lab 05/13/17 0920 05/13/17 1920  05/13/17 2109 05/14/17 0348  NA 138 139 138 138  K 3.3* 3.6 4.2 3.8  CL 100*  --  103 104  CO2 27  --  28 27  BUN 17  --  13 13  CREATININE 0.86  --  0.73 0.92  GLUCOSE 128*  --  132* 129*    Electrolytes  Recent Labs Lab 05/13/17 0920 05/13/17 2109 05/14/17 0348  CALCIUM 8.2* 7.8* 7.6*  MG  --   --  1.7  PHOS  --   --  2.9    CBC  Recent Labs Lab 05/13/17 0920 05/13/17 1920 05/13/17 2109 05/14/17 0348  WBC 21.8*  --  23.1* 22.8*  HGB 9.4* 8.5* 9.1* 8.8*  HCT 26.7* 25.0* 25.9* 24.7*  PLT 449*  --  512* 467*    Coag's No results for input(s): APTT, INR in the last 168 hours.  Sepsis Markers No results for input(s): LATICACIDVEN, PROCALCITON, O2SATVEN in the last 168 hours.  ABG  Recent Labs Lab 05/13/17 1920 05/13/17 2140 05/14/17 0440  PHART 7.351 7.323* 7.450  PCO2ART 52.8* 55.6* 40.5  PO2ART 154.0* 98.0 90.3    Liver Enzymes No results for input(s): AST, ALT, ALKPHOS, BILITOT, ALBUMIN in the last 168 hours.  Cardiac Enzymes No results for input(s): TROPONINI, PROBNP in the last 168 hours.  Glucose No results for input(s): GLUCAP in the last 168 hours.  Imaging Ct  Abdomen Pelvis W Contrast  Result Date: 05/13/2017 CLINICAL DATA:  Inpatient. Ileus. Severe abdominal pain. Status post laparoscopic converted to open right adrenalectomy on 02/63/7858 complicated by small bowel injury requiring small bowel resection with dense adhesions throughout the abdomen. Prior appendectomy and right hemicolectomy. EXAM: CT ABDOMEN AND PELVIS WITH CONTRAST TECHNIQUE: Multidetector CT imaging of the abdomen and pelvis was performed using the standard protocol following bolus administration of intravenous contrast. CONTRAST:  176mL ISOVUE-300 IOPAMIDOL (ISOVUE-300) INJECTION 61% COMPARISON:  05/11/2017 abdominal radiographs. 09/15/2015 CT abdomen/pelvis. 12/16/2016 chest CT. FINDINGS: Lower chest: Small dependent right pleural effusion. Patchy consolidation and  ground-glass attenuation throughout both lung bases with some volume loss. Hepatobiliary: Normal liver size. No discrete liver mass. Mild heterogeneity in the anterior inferior right liver lobe near the gallbladder fossa may be artifactual versus mild postsurgical change. Normal gallbladder with no radiopaque cholelithiasis. No biliary ductal dilatation. Pancreas: Normal, with no mass or duct dilation. Spleen: Normal size. No mass. Adrenals/Urinary Tract: Status post right adrenalectomy. Ill-defined 5.1 x 3.8 cm simple appearing fluid collection in the right adrenalectomy bed (series 2/ image 36) without internal gas or associated wall thickening. No left adrenal nodules. No hydronephrosis. No renal masses. Stomach/Bowel: Distended fluid-filled stomach with no appreciable gastric wall thickening or pneumatosis. There is diffuse dilatation of the small bowel up to 5.6 cm diameter, with fluid levels throughout the small bowel. Surgical sutures are noted in the right abdomen from enteroenterostomy sites. No discrete small bowel caliber transition site. No small bowel wall thickening. Status post right hemicolectomy with intact appearing ileocolic anastomosis. Mild diffuse dilatation of the large bowel with fluid levels throughout the large bowel. No large bowel wall thickening. Vascular/Lymphatic: Atherosclerotic nonaneurysmal abdominal aorta. Patent portal, splenic, hepatic and renal veins. No pathologically enlarged lymph nodes in the abdomen or pelvis. Reproductive: Top-normal size prostate with nonspecific internal prostatic calcifications. Other: Pneumoperitoneum under the right hemidiaphragm and in the ventral right abdominal wall. No additional focal fluid collections. Open wound in the ventral right upper abdominal wall. Mild dependent superficial edema in the low back and lateral gluteal regions. Musculoskeletal: No aggressive appearing focal osseous lesions. IMPRESSION: 1. Diffuse dilatation of the small  greater the large bowel with fluid levels throughout the small and large bowel, most compatible with adynamic ileus in the postoperative state. No bowel wall thickening. No discrete small bowel caliber transition to suggest obstruction. 2. Pneumoperitoneum in the right upper quadrant is presumably due to recent surgery . 3. Small postoperative fluid collection at the right adrenalectomy site without wall thickening or internal gas. 4. Small dependent right pleural effusion. 5. Patchy bibasilar lung consolidation and ground-glass attenuation with some volume loss, concerning for multilobar pneumonia and/or aspiration with some atelectasis. Electronically Signed   By: Ilona Sorrel M.D.   On: 05/13/2017 18:01   Dg Chest Port 1 View  Result Date: 05/14/2017 CLINICAL DATA:  Acute respiratory failure with hypoxemia, current smoker. EXAM: PORTABLE CHEST 1 VIEW COMPARISON:  05/11/2017 FINDINGS: Heart size is top normal with aortic atherosclerosis. Endotracheal tube tip is seen 3.4 cm above the carina. A gastric tube is noted in the expected location of the stomach with tip in the region of the gastric antrum. Interstitial edema is noted with central vascular congestion. Mild prominence of the right hilum is again noted which may reflect central vascular congestion and/or adenopathy. Superimposed pneumonia is not entirely excluded. Resolution of free intraperitoneal air beneath the right hemidiaphragm on current study. No acute nor suspicious osseous abnormality. IMPRESSION: 1.  Satisfactory support line and tube positions. 2. Bilateral diffuse interstitial edema is again noted with slight improvement in aeration at the right lung base and resolution of pneumoperitoneum. 3. Right hilar prominence is seen similar to prior and may reflect adenopathy, vascular summation or possibly superimposed pneumonia. Electronically Signed   By: Ashley Royalty M.D.   On: 05/14/2017 03:58     STUDIES:  CT A / P 7/18 > adynamic ileus,  pneumoperitoneum, small post operative fluid collection at right adrenalectomy site without wall thickening or gas, small right pleural effusion.  CULTURES: Abdominal wound 7/18 >>  BCx2 7/19 >>   ANTIBIOTICS: Vanc 7/17 >> Cefepime 7/17 >> Eraxis 7/19 >>  SIGNIFICANT EVENTS: 7/11  OR for lap converted to open right adrenalectomy, SBR with anastomosis, placement of wound vac. 7/18  Found to have leak > taken back to OR for redo of ex-lap.  Returned to ICU on vent. 7/19  Pt pulled NGT out, almost self extubated (pulled to 19, adjusted by RT) > cuff leak / tube exchanged  LINES/TUBES: ETT 7/18 >>  DISCUSSION: 56 y.o. 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  Taken to OR for right adrenalectomy, SBR with anastomosis and placement of wound vac.  7/18 developed small leak so taken back to OR for re-do of ex-lap and repair of leak.  Returned to ICU on vent.  ASSESSMENT / PLAN:  PULMONARY A: Respiratory insufficiency - due to inability to wean from vent post op. Tobacco dependence ETT Cuff Leak - noted 7/19  P:   Plan for ETT exchange this am PRVC 8 cc/kg  Wean PEEP / FiO2 for sats > 90% Increase PEEP to 8 CXR now and in am  SBT but no plan for extubation until CCS has closed abdomen  VAP prevention measures  Smoking cessation counseling once able  CARDIOVASCULAR A:  Sinus tachycardia. Hx HTN. P:  Hold home clonidine, atorvastatin, chlorthalidone, labetalol, valsartan PRN labetalol  Tele monitoring   RENAL A:   Hypokalemia At Risk AKI P:   Trend BMP / urinary output Replace electrolytes as indicated Avoid nephrotoxic agents, ensure adequate renal perfusion  GASTROINTESTINAL A:   S/p re-do of ex-lap post right adrenalectomy. GERD. Nutrition. P:   Post-op rec's per CCS SUP: PPI  NPO  May need early TNA > defer to CCS  HEMATOLOGIC A:   Anemia - chronic. VTE Prophylaxis. P:  Trend CBC Transfuse for Hgb <7 SCD's  + Lovenox for DVT prophylaxis  INFECTIOUS A:   At risk for peritonitis due to intrabdominal leak. P:   Continue Vanco + Cefepime as above Follow cultures  Add Eraxis given fever  ENDOCRINE A:   Right adrenal mass - s/p adrenalectomy 7/11. Hx hypothyroidism. P:   Monitor glucose on BMP TSH wnl > 0.552  NEUROLOGIC A:   Acute encephalopathy - due to sedation. Hx anxiety. P:   RASS goal: 0 to -1 Continue propofol / fentanyl gtt's  Daily WUA  Hold home gabapentin, lorazepam, methocarbamol, percocet   Family updated:  Wife updated on am NP rounds on plan of care  Interdisciplinary Family Meeting v Palliative Care Meeting:  Due by: 05/19/17.   CC Time:  27 minutes  Noe Gens, NP-C [facility] Pgr: [phone] or if no answer [phone] 05/14/2017, 8:23 AM

## 2017-05-14 NOTE — Progress Notes (Signed)
Initial Nutrition Assessment  DOCUMENTATION CODES:   Not applicable  INTERVENTION:  - Will monitor for TPN initiation. - Will communicate with Pharmacy.  NUTRITION DIAGNOSIS:   Inadequate oral intake related to inability to eat as evidenced by NPO status.  GOAL:   Patient will meet greater than or equal to 90% of their needs  MONITOR:   Vent status, Weight trends, Labs, Skin, I & O's  REASON FOR ASSESSMENT:   Ventilator  ASSESSMENT:   56 y.o. 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 On 7/18, he developed a leak therefore was taken back to OR for re-do of ex-lap and repair of leak.  Pt seen for new vent. BMI indicates overweight status. Weight had been 193-199 lbs from 03/06/15-05/04/17; weight from 05/04/17 (197 lbs/89.4 kg) used in estimating needs as weight +19 lbs/8.6 kg since that time. Surgical hx this admission as outlined above. Pt had NGT in place which he pulled ~ 7:35 AM today and no NGT/OGT in place at this time.   Wife at bedside. She reports that PTA pt had a very good appetite with no changes until the day of admission. Following surgery on 05/06/17 pt was advanced to CLD then to Dysphagia 1, thin liquids then to Regular diet. Wife reports minimal intakes during that time (x1 week).   Per PCCM NP note this AM, possible need for TPN. Wife reports she was informed of plan for PICC placement today and initiation of IV nutrition. If TPN initiated, recommend goal of Clinimix E 5/20 @ 110 mL/hr (ILE to be held x7 days per ICU protocol). Without ILE, this regimen will provide 2323 kcal, 132 grams of protein.   Physical assessment shows no muscle and no fat wasting; mild edema to extremities.   Patient is currently intubated on ventilator support MV: 14.1 L/min Temp (24hrs), Avg:100.4 F (38 C),  Min:98 F (36.7 C), Max:102.8 F (39.3 C) Propofol: 14.7 ml/hr (388 kcal from fat) BP: 100/48 and MAP: 60  Medications reviewed; 40 mg IV Protonix/day, 40 mEq KCl per NGT x1 dose yesterday, 100 mg thiamine/day. Labs reviewed; Ca: 7.6 mg/dL.  Drips: Propofol @ 25 mcg/kg/min, Fentanyl @ 325 mcg/hr.    Diet Order:  Diet NPO time specified  Skin:  Wound (see comment) (Incisions to R flank and abdomen from 7/11 and 7/18)  Last BM:  7/16  Height:   Ht Readings from Last 1 Encounters:  05/14/17 5\' 10"  (1.778 m)    Weight:   Wt Readings from Last 1 Encounters:  05/13/17 216 lb 0.8 oz (98 kg)    Ideal Body Weight:  75.45 kg  BMI:  Body mass index is 31 kg/m.  Estimated Nutritional Needs:   Kcal:  2453  Protein:  134 grams  Fluid:  2.4 L/day  EDUCATION NEEDS:   No education needs identified at this time    [name] Matin, MS, RD, LDN, CNSC Inpatient Clinical Dietitian Pager # [phone] After hours/weekend pager # [phone]

## 2017-05-14 NOTE — Anesthesia Postprocedure Evaluation (Signed)
Anesthesia Post Note  Patient: [name] [name]  Procedure(s) Performed: Procedure(s) (LRB): EXPLORATORY LAPAROTOMY, LYSIS OF ADHESIONS/ SMALL BOWEL RESECTION/ WASHOUT OF ABDOMEN AND PLACEMENT OF NEGATIVE PRESSURE DRESSING (N/A)     Patient location during evaluation: SICU Anesthesia Type: General Level of consciousness: sedated Pain management: pain level controlled Vital Signs Assessment: post-procedure vital signs reviewed and stable Respiratory status: patient remains intubated per anesthesia plan Cardiovascular status: stable Anesthetic complications: no    Last Vitals:  Vitals:   05/14/17 1933 05/14/17 2003  BP:    Pulse: (!) 124   Resp: (!) 23   Temp:  37.2 C    Last Pain:  Vitals:   05/14/17 2003  TempSrc: Axillary  PainSc:                  Tiajuana Amass

## 2017-05-14 NOTE — Progress Notes (Signed)
Date:  May 14, 2017 Chart reviewed for concurrent status and case management needs. Will continue to follow patient progress. Intubated s/p surg. [id number] Discharge Planning: following for needs Expected discharge date: [id number] [name], BSN, [place], [place]

## 2017-05-14 NOTE — Progress Notes (Signed)
[place] NOTE   Pharmacy Consult for TPN Indication: prolonged ileus, suspected small bowel leak  Patient Measurements: Body mass index is 31 kg/m. Filed Weights   05/06/17 0947 05/06/17 1715 05/13/17 2100  Weight: 197 lb (89.4 kg) 203 lb 7.8 oz (92.3 kg) 216 lb 0.8 oz (98 kg)   HPI: 20 yoM admitted on 7/11 for enlarging adrenal mass and planned adrenalectomy.  Pharmacy is now consulted to dose TPN.  Significant events:  7/11 OR:  right adrenalectomy with complication of intestinal injury and small bowel resection with anastomosis  7/18 OR: repair of small bowel anastomotic leak, abdominal closure of wound dehisence 7/19 OR: concern of re-leaking  Insulin requirements past 24 hours: none (no hx DM)  Current Nutrition: NPO  IVF: D5 0.45% with KCl 20 mEq/L at 100 ml/hr  Central access: PICC line ordered 7/19 TPN start date: 7/19 if PICC line placed  ASSESSMENT                                                                                                          Today:   Glucose:  Within goal < 150  Electrolytes:  WNL except Ca 7.6  Renal:  SCr 0.92  LFTs:  WNL (7/9)  TGs:  296 (7/18)  Prealbumin:  Pending  Propofol infusion at 25 mcg/kg/min : 13.8 mL/hr    NUTRITIONAL GOALS                                                                                             RD recs (7/19):  134 Protein, 2453 Kcal Clinimix 5/15 at a goal rate of 115 ml/hr + 20% fat emulsion 240 ml/day to provide: 138 g of protein and 2440 kCals per day meeting >100 % of protein and 99% of kCal needs   Based on ASPEN guidelines, while the patient meets ICU status the initiation of lipids is being delayed for 7 days or until transition out of ICU. Planned start date for lipids is 05/21/17.  Goal will be to meet 100% of the patient's protein needs and approximately 80% of their caloric needs. Clinimix 5/15 @ 115 mL/hr to provide 138 grams protein (>100 %  of goal) and 1960 kcal (80% of goal)    PLAN  At 1800 today:  Start Clinimix E 5/15 at 40 ml/hr.  Hold lipids for the first 7 days.  Plan to advance as tolerated to the goal rate.  TPN to contain standard multivitamins daily and trace elements only M/W/F.  Reduce IVF to 60 ml/hr.  Continue CBGs and SSI per MD orders.  TPN lab panels on Mondays & Thursdays.  BMET, Mag, and Phos x 3 days  Gretta Arab PharmD, BCPS Pager [phone] 05/14/2017 2:06 PM

## 2017-05-14 NOTE — Progress Notes (Signed)
0733: RN heard pt's ventilator alarming, and immediately went to assess.  Upon assessment, pt's NG tube was seen out of patient's nose and on patient's bed.  Ventilator alarm had resolved. Patient was agitated. PRN medications were provided for said agitation. Oral suction was performed; copious amounts of thick white, yellow, and red secretions were removed.  0743: ETT was assessed to be 22 cm at the lip.  Respiratory was notified to come assess the ETT, and was at bedside within 2 minutes.  Patient's oxygen saturation remained in the low to mid 90's.   3888: Patient's oxygen saturation maintaining at mid to high 80's.  ETT suctioned with very little secretions returned.  O2 breath provided on the ventilator. Respiratory notified and reassessed.  2800: Patient's oxygen saturation is in the low 90's.  HR mid 90's.  Patient is resting, with no signs of distress.  Will make PCCM aware, and will continue to monitor.

## 2017-05-14 NOTE — Progress Notes (Signed)
Upon assessment of patient's abdominal wound this morning, dressing was intact with minimal serosanguinous drainage present on inside of ABD.   RN went to change the patient's abdominal dressing at 1050.  Upon removal of abdominal binder, dressing was assessed to have copious amounts of brown/yellow drainage on ABD pad. ABD pads were pulled back to show gauze packing to have copious amounts of the same drainage present with a foul odor.  PCCM was made aware.  Surgery PA, Creig Hines, came in STAT to assess the wound, and he subsequently notified the surgical team and MD following the patient, at 1116, about the findings  Wound was repacked with and dressed with care orders.  Will continue to monitor.

## 2017-05-15 ENCOUNTER — Encounter (HOSPITAL_COMMUNITY): Payer: Self-pay | Admitting: General Surgery

## 2017-05-15 ENCOUNTER — Inpatient Hospital Stay (HOSPITAL_COMMUNITY): Payer: Medicare Other

## 2017-05-15 LAB — COMPREHENSIVE METABOLIC PANEL
ALBUMIN: 1.5 g/dL — AB (ref 3.5–5.0)
ALT: 35 U/L (ref 17–63)
ANION GAP: 9 (ref 5–15)
AST: 64 U/L — ABNORMAL HIGH (ref 15–41)
Alkaline Phosphatase: 89 U/L (ref 38–126)
BUN: 11 mg/dL (ref 6–20)
CO2: 25 mmol/L (ref 22–32)
Calcium: 7.3 mg/dL — ABNORMAL LOW (ref 8.9–10.3)
Chloride: 106 mmol/L (ref 101–111)
Creatinine, Ser: 0.92 mg/dL (ref 0.61–1.24)
GFR calc non Af Amer: >60 mL/min (ref >60)
GLUCOSE: 132 mg/dL — AB (ref 65–99)
POTASSIUM: 4.3 mmol/L (ref 3.5–5.1)
SODIUM: 140 mmol/L (ref 135–145)
TOTAL PROTEIN: 4.4 g/dL — AB (ref 6.5–8.1)
Total Bilirubin: 2.2 mg/dL — ABNORMAL HIGH (ref 0.3–1.2)

## 2017-05-15 LAB — PHOSPHORUS: Phosphorus: 5.1 mg/dL — ABNORMAL HIGH (ref 2.5–4.6)

## 2017-05-15 LAB — DIFFERENTIAL
BASOS ABS: 0.3 10*3/uL — AB (ref 0.0–0.1)
Basophils Relative: 1 %
EOS ABS: 0.3 10*3/uL (ref 0.0–0.7)
Eosinophils Relative: 1 %
LYMPHS PCT: 7 %
Lymphs Abs: 2.4 10*3/uL (ref 0.7–4.0)
MONO ABS: 1.7 10*3/uL — AB (ref 0.1–1.0)
Monocytes Relative: 5 %
NEUTROS ABS: 29.2 10*3/uL — AB (ref 1.7–7.7)
NEUTROS PCT: 86 %

## 2017-05-15 LAB — CBC
HCT: 24.6 % — ABNORMAL LOW (ref 39.0–52.0)
Hemoglobin: 8.4 g/dL — ABNORMAL LOW (ref 13.0–17.0)
MCH: 26.9 pg (ref 26.0–34.0)
MCHC: 34.1 g/dL (ref 30.0–36.0)
MCV: 78.8 fL (ref 78.0–100.0)
Platelets: 525 10*3/uL — ABNORMAL HIGH (ref 150–400)
RBC: 3.12 MIL/uL — ABNORMAL LOW (ref 4.22–5.81)
RDW: 14.3 % (ref 11.5–15.5)
WBC: 33.9 10*3/uL — ABNORMAL HIGH (ref 4.0–10.5)

## 2017-05-15 LAB — GLUCOSE, CAPILLARY
GLUCOSE-CAPILLARY: 139 mg/dL — AB (ref 65–99)
GLUCOSE-CAPILLARY: 126 mg/dL — AB (ref 65–99)
GLUCOSE-CAPILLARY: 148 mg/dL — AB (ref 65–99)
Glucose-Capillary: 130 mg/dL — ABNORMAL HIGH (ref 65–99)
Glucose-Capillary: 138 mg/dL — ABNORMAL HIGH (ref 65–99)
Glucose-Capillary: 122 mg/dL — ABNORMAL HIGH (ref 65–99)
Glucose-Capillary: 135 mg/dL — ABNORMAL HIGH (ref 65–99)

## 2017-05-15 LAB — AEROBIC CULTURE W GRAM STAIN (SUPERFICIAL SPECIMEN)

## 2017-05-15 LAB — AEROBIC CULTURE  (SUPERFICIAL SPECIMEN)

## 2017-05-15 LAB — MAGNESIUM: MAGNESIUM: 1.6 mg/dL — AB (ref 1.7–2.4)

## 2017-05-15 LAB — PREALBUMIN: Prealbumin: <5 mg/dL — ABNORMAL LOW (ref 18–38)

## 2017-05-15 LAB — TRIGLYCERIDES: TRIGLYCERIDES: 266 mg/dL — AB (ref <150)

## 2017-05-15 MED ORDER — CHLORHEXIDINE GLUCONATE CLOTH 2 % EX PADS
6.0000 | MEDICATED_PAD | Freq: Every day | CUTANEOUS | Status: DC
  Administered 2017-05-15 – 2017-05-24 (×10): 6 via TOPICAL

## 2017-05-15 MED ORDER — METRONIDAZOLE IN NACL 5-0.79 MG/ML-% IV SOLN
500.0000 mg | Freq: Three times a day (TID) | INTRAVENOUS | Status: DC
  Administered 2017-05-15 – 2017-05-25 (×31): 500 mg via INTRAVENOUS
  Filled 2017-05-15 (×32): qty 100

## 2017-05-15 MED ORDER — TRACE MINERALS CR-CU-MN-SE-ZN 10-1000-500-60 MCG/ML IV SOLN
INTRAVENOUS | Status: AC
  Administered 2017-05-15: 18:00:00 via INTRAVENOUS
  Filled 2017-05-15 (×2): qty 960

## 2017-05-15 MED ORDER — KCL IN DEXTROSE-NACL 20-5-0.45 MEQ/L-%-% IV SOLN
INTRAVENOUS | Status: DC
  Administered 2017-05-15: 18:00:00 via INTRAVENOUS
  Administered 2017-05-16: 60 mL via INTRAVENOUS
  Administered 2017-05-16: 16:00:00 via INTRAVENOUS
  Filled 2017-05-15 (×3): qty 1000

## 2017-05-15 MED ORDER — ACETAMINOPHEN 650 MG RE SUPP
650.0000 mg | RECTAL | Status: DC | PRN
  Administered 2017-05-15 – 2017-05-26 (×10): 650 mg via RECTAL
  Filled 2017-05-15 (×11): qty 1

## 2017-05-15 MED ORDER — SODIUM CHLORIDE 0.9% FLUSH
10.0000 mL | INTRAVENOUS | Status: DC | PRN
  Administered 2017-05-15 – 2017-05-22 (×2): 10 mL
  Administered 2017-05-29 – 2017-05-31 (×2): 20 mL
  Administered 2017-06-05 – 2017-06-26 (×7): 10 mL
  Administered 2017-06-27: 40 mL
  Administered 2017-06-27 – 2017-07-06 (×3): 10 mL
  Administered 2017-07-06 (×2): 30 mL
  Administered 2017-07-17 – 2017-08-07 (×6): 10 mL
  Administered 2017-08-18: 20 mL
  Administered 2017-08-21: 40 mL
  Filled 2017-05-15 (×25): qty 40

## 2017-05-15 MED ORDER — MAGNESIUM SULFATE IN D5W 1-5 GM/100ML-% IV SOLN
1.0000 g | Freq: Once | INTRAVENOUS | Status: AC
  Administered 2017-05-15: 1 g via INTRAVENOUS
  Filled 2017-05-15: qty 100

## 2017-05-15 NOTE — Progress Notes (Signed)
This RN wasted 30 mL of Fentanyl in sink with Prince Rome, RN.

## 2017-05-15 NOTE — Addendum Note (Signed)
Addendum  created 05/15/17 0854 by Sharlette Dense, CRNA   Anesthesia Event deleted, Anesthesia Event edited

## 2017-05-15 NOTE — Progress Notes (Signed)
Nutrition Follow-up  DOCUMENTATION CODES:   Not applicable  INTERVENTION:  - TPN per Pharmacy. - Will continue to monitor medical course and adjust estimated nutrition needs as warranted.   NUTRITION DIAGNOSIS:   Inadequate oral intake related to inability to eat as evidenced by NPO status. -ongoing  GOAL:   Patient will meet greater than or equal to 90% of their needs -unable to meet at this time.   MONITOR:   Vent status, Weight trends, Labs, Skin, I & O's, Other (Comment) (TPN regimen)  ASSESSMENT:   56 y.o. 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 On 7/18, he developed a leak therefore was taken back to OR for re-do of ex-lap and repair of leak.  7/20 Pt remains intubated and OGT placed when pt returned to the OR yesterday. PICC not placed yesterday d/t return to the OR and was placed this AM at ~8:15 AM. Plan to start TPN today.   Pt returned to the OR yesterday for intestinal leak with ischemic intestine. Per Dr. Marylou Mccoy note yesterday at 7:23 PM: reopening of recent laparotomy, lysis of adhesions, small bowel resection, placement of negative pressure dressing. Decision was made not to proceed with anastomosis or fascia closure but rather temporary vac closure device and planned reoperation in 72h.  Estimated nutrition needs updated based on surgery and open abdomen. Needs based on weight from 7/9 (89.4 kg). Pt currently receiving 789 kcal from Propofol and IVF (D5). Per Pharmacy's note plan to use electrolyte-free TPN d/t hyperphosphatemia. Plan for Clinimix 5/15 @ 40 mL/hr to start today at 1800. This regimen will provide 48 grams of protein and 682 kcal.   Patient is currently intubated on ventilator support MV: 10.7 L/min Temp (24hrs), Avg:100.1 F (37.8 C), Min:98.8 F (37.1 C),  Max:102.1 F (38.9 C) Propofol: 20.6 ml/hr (544 kcal from fat) BP: 92/48 and MAP: 59  Medications reviewed; sliding scale Novolog, 1 g IV Mg sulfate x1 run today, 40 mg IV Protonix/day, 100 mg thiamine/day. Labs reviewed; Ca: 7.3 mg/dL, Phos: 5.1 mg/dL, Mg: 1.6 mg/dL.  IVF: D5-1/2 NS-20 mEq KCl @ 60 mL/hr (245 kcal from dextrose). Drips: Propofol @ 35 mcg/kg/min; Fentanyl @ 350 mcg/hr.    7/19 - Weight had been 193-199 lbs from 03/06/15-05/04/17. - Weight from 05/04/17 (197 lbs/89.4 kg) used in estimating needs as weight +19 lbs/8.6 kg since that time.  - Surgical hx this admission as outlined above.  - Pt had NGT in place which he pulled ~ 7:35 AM today and no NGT/OGT in place at this time.  - Family at bedside reports that PTA pt had a very good appetite with no changes until the day of admission.  - Following surgery on 05/06/17 pt was advanced to CLD then to Dysphagia 1, thin liquids then to Regular diet.  - Family reports minimal intakes during that time (x1 week).  - Per PCCM NP note this AM, possible need for TPN. - Family reports being informed of plan for PICC placement today and initiation of IV nutrition.  - If TPN initiated, recommend goal of Clinimix E 5/20 @ 110 mL/hr (ILE to be held x7 days per ICU protocol). Without ILE, this regimen will provide 2323 kcal, 132 grams of protein.   Physical assessment shows no muscle and no fat wasting; mild edema to extremities.   Patient is  currently intubated on ventilator support MV: 14.1 L/min Temp (24hrs), Avg:100.4 F (38 C), Min:98 F (36.7 C), Max:102.8 F (39.3 C) Propofol: 14.7 ml/hr (388 kcal from fat) BP: 100/48 and MAP: 60 Drips: Propofol @ 25 mcg/kg/min, Fentanyl @ 325 mcg/hr.     Diet Order:  Diet NPO time specified TPN (CLINIMIX) Adult without lytes  Skin:  Wound (see comment) (Incisions to R flank and abdomen from 7/11 and 7/18)  Last BM:  7/16  Height:   Ht Readings from Last 1 Encounters:  05/14/17 5\' 10"   (1.778 m)    Weight:   Wt Readings from Last 1 Encounters:  05/13/17 216 lb 0.8 oz (98 kg)    Ideal Body Weight:  75.45 kg  BMI:  Body mass index is 31 kg/m.  Estimated Nutritional Needs:   Kcal:  2280-2510 ([employer] Equation + x1.1 stress factor)  Protein:  160 grams of protein (1.8 grams/kg)  Fluid:  2.4 L/day  EDUCATION NEEDS:   No education needs identified at this time    Jarome Matin, MS, RD, LDN, [facility] Inpatient Clinical Dietitian Pager # [phone] After hours/weekend pager # [phone]

## 2017-05-15 NOTE — Progress Notes (Signed)
PULMONARY / CRITICAL CARE MEDICINE   Name: [name] [name] MRN: [id number] DOB: Jun 03, 1961    ADMISSION DATE:  05/06/2017 CONSULTATION DATE:  05/13/17  REFERRING MD:  Kieth Brightly  CHIEF COMPLAINT:  Vent Management  BRIEF SUMMARY:  [name] [name] is a 56 y.o. 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  On 7/18, he developed a leak therefore was taken back to OR for re-do of ex-lap and repair of leak.  He returned to the ICU on the vent and PCCM was consulted for vent management.   SUBJECTIVE:  RN reports pt pulled NGT tube out, almost self extubated (pulled tube out to 19cm).  ETT repositioned by RT. BP soft with sedation.  Tmax 102.8 in last 24 hours.  I/O - +5.8 L in last 24 hours  VITAL SIGNS: BP (!) 79/59 (BP Location: Left Leg)   Pulse (!) 103   Temp 99.6 F (37.6 C) (Axillary)   Resp 20   Ht 5\' 10"  (1.778 m)   Wt 216 lb 0.8 oz (98 kg)   SpO2 96%   BMI 31.00 kg/m   HEMODYNAMICS:    VENTILATOR SETTINGS: Vent Mode: PRVC FiO2 (%):  [60 %-70 %] 60 % Set Rate:  [16 bmp] 16 bmp Vt Set:  [580 mL] 580 mL PEEP:  [10 cmH20] 10 cmH20 Plateau Pressure:  [19 cmH20-28 cmH20] 25 cmH20  INTAKE / OUTPUT: I/O last 3 completed shifts: In: 14301.2 [I.V.:12011.2; NG/GT:30; IV Piggyback:2260] Out: 4373 [Urine:2635; Emesis/NG output:1700; Drains:535; Blood:300]   PHYSICAL EXAMINATION: General: adult male, appears uncomfortable on vent  HEENT: MM pink/moist, ETT, no jvd Neuro: Awakens to voice, nods, intermittently sedate, MAE CV: s1s2 rrr, no m/r/g PULM: even/non-labored, lungs bilaterally clear  GI: abdominal binder in place, abd dressing with small amt serosanguinous drainage, JP drain RLQ Extremities: warm/dry, no edema  Skin: no rashes or lesions  LABS:  BMET  Recent Labs Lab 05/13/17 2109  05/14/17 0348 05/14/17 1659 05/15/17 0653  NA 138 138 141 140  K 4.2 3.8 3.5 4.3  CL 103 104  --  106  CO2 28 27  --  25  BUN 13 13  --  11  CREATININE 0.73 0.92  --  0.92  GLUCOSE 132* 129*  --  132*    Electrolytes  Recent Labs Lab 05/13/17 2109 05/14/17 0348 05/15/17 0653  CALCIUM 7.8* 7.6* 7.3*  MG  --  1.7 1.6*  PHOS  --  2.9 5.1*    CBC  Recent Labs Lab 05/13/17 2109 05/14/17 0348 05/14/17 1659 05/15/17 0653  WBC 23.1* 22.8*  --  33.9*  HGB 9.1* 8.8* 8.8* 8.4*  HCT 25.9* 24.7* 26.0* 24.6*  PLT 512* 467*  --  525*    Coag's No results for input(s): APTT, INR in the last 168 hours.  Sepsis Markers No results for input(s): LATICACIDVEN, PROCALCITON, O2SATVEN in the last 168 hours.  ABG  Recent Labs Lab 05/13/17 2140 05/14/17 0440 05/14/17 1659  PHART 7.323* 7.450 7.382  PCO2ART 55.6* 40.5 49.4*  PO2ART 98.0 90.3 187.0*    Liver Enzymes  Recent Labs Lab 05/15/17 0653  AST 64*  ALT 35  ALKPHOS 89  BILITOT 2.2*  ALBUMIN 1.5*    Cardiac Enzymes No results for input(s): TROPONINI, PROBNP in the last 168 hours.  Glucose  Recent Labs Lab 05/14/17 1533  05/14/17 1941 05/14/17 2312 05/15/17 0350  GLUCAP 125* 119* 109* 130*    Imaging Dg Chest Port 1 View  Result Date: 05/15/2017 CLINICAL DATA:  Respiratory failure.  Hypoxia. EXAM: PORTABLE CHEST 1 VIEW COMPARISON:  05/14/2017 . FINDINGS: Endotracheal tube is been repositioned, its tip is 3.2 cm above the carina. NG tube noted with tip coiled in stomach. Heart size normal. Diffuse bilateral pulmonary infiltrates again noted. Left pulmonary infiltrate has improved prior exam. No prominent pleural effusion. No pneumothorax . IMPRESSION: 1. Interim endotracheal tube repositioning with its tip now 3.2 cm above the carina. NG tube noted with tip coiled in stomach. 2. Persistent bilateral pulmonary infiltrates. Left pulmonary infiltrate has improved from prior exam . 3. Stable cardiomegaly .  Electronically Signed   By: Marcello Moores  Register   On: 05/15/2017 07:11     STUDIES:  CT A / P 7/18 > adynamic ileus, pneumoperitoneum, small post operative fluid collection at right adrenalectomy site without wall thickening or gas, small right pleural effusion. CXR 05/15/2017>>Persistent bilateral pulmonary infiltrates. Left pulmonary infiltrate has improved from prior exam .  CULTURES: Abdominal wound 7/18 >> Moderate Klebsiella pneumonia prelim BCx2 7/19 >>   ANTIBIOTICS: Vanc 7/17 >> Cefepime 7/17 >> Eraxis 7/19 >> Flagyl 7/20>>   SIGNIFICANT EVENTS: 7/11  OR for lap converted to open right adrenalectomy, SBR with anastomosis, placement of wound vac. 7/18  Found to have leak > taken back to OR for redo of ex-lap.  Returned to ICU on vent. 7/19  Pt pulled NGT out, almost self extubated (pulled to 19, adjusted by RT) > cuff leak / tube exchanged 05/14/2017>> Back to OR Emergently>> intestinal leak/ ischemic intestine  LINES/TUBES: ETT 7/18 >>  DISCUSSION: 56 y.o. 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  Taken to OR for right adrenalectomy, SBR with anastomosis and placement of wound vac.  7/18 developed small leak so taken back to OR for re-do of ex-lap and repair of leak.  Returned to ICU on vent.  ASSESSMENT / PLAN:  PULMONARY A: Respiratory insufficiency - due to inability to wean from vent post op. Tobacco dependence ETT Cuff Leak - noted 7/19  P:   ETT exchange 7/19 PRVC 8 cc/kg  Wean PEEP / FiO2 for sats > 90% Increase PEEP to 8 CXR 7/21 No SBT 7/20 as emergent OR 7/19 and open abdomen  SBT when able/ noplan to extubate until CCS has closed abdomen  VAP prevention measures  Smoking cessation counseling once able  CARDIOVASCULAR A:  Sinus tachycardia. Hx HTN. P:  Hold home clonidine, atorvastatin, chlorthalidone, labetalol, valsartan PRN labetalol  Tele monitoring  Maintain MAP > 65  RENAL A:   Hypokalemia At  Risk AKI P:   Trend BMP / urinary output Replace electrolytes as indicated Avoid nephrotoxic agents, ensure adequate renal perfusion  GASTROINTESTINAL A:   S/p re-do of ex-lap post right adrenalectomy. Intestinal leak/ ischemia GERD. Nutrition. P:   Post-op rec's per CCS SUP: PPI  NPO  Consider early TNA > defer to CCS  HEMATOLOGIC A:   Anemia - chronic. VTE Prophylaxis. P:  Trend CBC Transfuse for Hgb <7 SCD's + Lovenox for DVT prophylaxis  INFECTIOUS A:   At risk for peritonitis due to intrabdominal leak. P:   Continue  Vanc + Cefepime >> Consider pharmacy consult to manage Vanc Follow cultures  Add Eraxis given fever Flagyl added per pharmacy  ENDOCRINE A:   Right adrenal mass - s/p adrenalectomy 7/11. Hx hypothyroidism.  P:   Monitor glucose on BMP TSH wnl > 0.552  NEUROLOGIC A:   Acute encephalopathy - due to sedation. Hx anxiety. P:   RASS goal: 0 to -1 Continue propofol / fentanyl gtt's  Daily WUA  Hold home gabapentin, lorazepam, methocarbamol, percocet   Family updated:  Wife updated on am NP rounds on plan of care. She is aware no extubation with open abdomen  Interdisciplinary Family Meeting v Palliative Care Meeting:  Due by: 05/19/17.   CC Time: 33  minutes  Magdalen Spatz, AGACNP-BC [facility] Pgr [phone] 05/15/2017, 9:43 AM

## 2017-05-15 NOTE — Progress Notes (Signed)
[facility] NOTE   Pharmacy Consult for TPN Indication: prolonged ileus, suspected small bowel leak  Patient Measurements: Body mass index is 31 kg/m. Filed Weights   05/06/17 0947 05/06/17 1715 05/13/17 2100  Weight: 197 lb (89.4 kg) 203 lb 7.8 oz (92.3 kg) 216 lb 0.8 oz (98 kg)   HPI: 85 yoM admitted on 7/11 for enlarging adrenal mass and planned adrenalectomy.  Pharmacy is now consulted to dose TPN.  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  Planning for return to OR in 48-72h  Insulin requirements past 24 hours: 4 units SSI (no hx DM)  Current Nutrition: NPO  IVF: D5 0.45% with KCl 20 mEq/L at 100 ml/hr  Central access: PICC line ordered 7/19, placed on 7/20 TPN start date: 7/20  ASSESSMENT                                                                                                          Today:   Glucose:  Within goal < 150  Electrolytes:  WNL, except Phos elevated at 5.1, Mag low at 1.6  Renal:  SCr 0.92  LFTs:  WNL except AST 64, Tbili 2.2 (7/20)  TGs:  296 (7/18)  Prealbumin:  Pending (7/18)  Propofol infusion (1.1 kcal/ml) at 35 mcg/kg/min : 19.4 mL/hr (provides ~512 kcal /24 hrs)   NUTRITIONAL GOALS                                                                                             RD recs (7/19):  134 Protein, 2453 Kcal Clinimix 5/15 at a goal rate of 115 ml/hr + 20% fat emulsion 240 ml/day to provide: 138 g of protein and 2440 kCals per day meeting >100 % of protein and 99% of kCal needs   Based on ASPEN guidelines, while the patient  meets ICU status the initiation of lipids is being delayed for 7 days or until transition out of ICU. Planned start date for lipids is 05/21/17.  Goal will be to meet 100% of the patient's protein needs and approximately 80% of their caloric needs. Clinimix 5/15 @ 115 mL/hr to provide 138 grams protein (>100 % of goal) and 1960 kcal (80% of goal)    PLAN  Now: Magnesium 1g IV bolus x1 dose  At 1800 today:  Start Clinimix 5/15 at 40 ml/hr - NO elytes d/t elevated phosphorus  Hold lipids for the first 7 days.  Plan to advance as tolerated to the goal rate.  TPN to contain standard multivitamins daily and trace elements only M/W/F.  Reduce IVF to 60 ml/hr.  Continue CBGs and SSI per MD orders.  TPN lab panels on Mondays & Thursdays.  BMET, Mag, and Phos x 3 days  Gretta Arab PharmD, BCPS Pager [phone] 05/15/2017 7:29 AM

## 2017-05-15 NOTE — Progress Notes (Signed)
Progress Note: General Surgery Service   Assessment/Plan: Patient Active Problem List   Diagnosis Date Noted  . Hypokalemia 05/14/2017  . Anastomotic leak of intestine 05/14/2017  . Severe sepsis ([place]) 05/14/2017  . Wound dehiscence, surgical 05/14/2017  . Aspiration pneumonia ([place]) 05/14/2017  . Chronic narcotic use 05/10/2017  . Anxiety state 05/10/2017  . Intra-abdominal adhesions s/p SB resection 05/06/2017 05/09/2017  . 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 Right adrenal mass s/p adrenalectomy 05/06/2017 05/06/2017  . Throat symptom 03/18/2016  . Multinodular goiter 01/19/2016  . Hyperthyroidism 01/19/2016  . Essential hypertension   . Diarrhea 11/30/2014  . Anemia, iron deficiency 11/01/2014  . Leukocytosis 11/03/2013  . Tobacco abuse 07/26/2013  . COPD (chronic obstructive pulmonary disease) ([place]) 07/26/2013  . Left knee pain 07/26/2013  . Pain in joint, shoulder region 05/03/2013  . GERD 07/24/2008  . 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   s/p Procedure(s): EXPLORATORY LAPAROTOMY, LYSIS OF ADHESIONS/ SMALL BOWEL RESECTION/ WASHOUT OF ABDOMEN AND PLACEMENT OF NEGATIVE PRESSURE DRESSING 05/14/2017 PICC placed this morning, increased O2/PEEP requirements since yesterday's surgery, expected leukocytosis rise after surgery yesterday, hemoglobin stable -continue vac to open abdomen -start TPN -adjust antibiotics: add flagyl, possibly remove vanc -continue OG to LIS -planned return to OR 'Sunday    LOS: 9 days  Chief Complaint/Subjective: OR yesterday for damage control of intestinal leak with small bowel resection, PICC placed this morning  Objective: Vital signs in last 24 hours: Temp:  [98.8 F (37.1 C)-102.1 F (38.9 C)] 99.6 F (37.6 C) (07/20 0400) Pulse Rate:  [94-128] 103 (07/20 0700) Resp:  [17-38] 20 (07/20 0700) BP: (113-162)/(60-72) 162/72 (07/19 2323) SpO2:  [88 %-100 %] 96 % (07/20 0700) Arterial Line BP: (86-137)/(42-65) 90/48 (07/20  0700) FiO2 (%):  [60 %-70 %] 60 % (07/20 0833) Last BM Date: 05/11/17  Intake/Output from previous day: 07/19 0701 - 07/20 0700 In: 7270.8 [I.V.:5860.8; IV Piggyback:1410] Out: 3385 [Urine:2260; Emesis/NG output:400; Drains:475; Blood:250] Intake/Output this shift: No intake/output data recorded.  Lungs: coarse R lower lung area, clear L lower lung area  Cardiovascular: tachycardic  Abd: vac in place with good suction, thin red/brown fluid, no erythema  Extremities: trace edema  Neuro: sedated, ventilated  Lab Results: CBC   Recent Labs  05/14/17 0348 05/14/17 1659 05/15/17 0653  WBC 22.8*  --  33.9*  HGB 8.8* 8.8* 8.4*  HCT 24.7* 26.0* 24.6*  PLT 467*  --  525*   BMET  Recent Labs  05/14/17 0348 05/14/17 1659 05/15/17 0653  NA 138 141 140  K 3.8 3.5 4.3  CL 104  --  106  CO2 27  --  25  GLUCOSE 129*  --  132*  BUN 13  --  11  CREATININE 0.92  --  0.92  CALCIUM 7.6*  --  7.3*   PT/INR No results for input(s): LABPROT, INR in the last 72 hours. ABG  Recent Labs  05/14/17 0440 05/14/17 1659  PHART 7.450 7.382  HCO3 27.0 29.3*    Studies/Results:  Anti-infectives: Anti-infectives    Start     Dose/Rate Route Frequency Ordered Stop   05/15/17 1000  anidulafungin (ERAXIS) 100 mg in sodium chloride 0.9 % 100 mL IVPB     100 mg 78 mL/hr over 100 Minutes Intravenous Every 24 hours 05/14/17 0829     05/15/17 1000  metroNIDAZOLE (FLAGYL) IVPB 500 mg     50' 0 mg 100 mL/hr over 60 Minutes Intravenous Every 8 hours 05/15/17 0903  05/14/17 0900  anidulafungin (ERAXIS) 200 mg in sodium chloride 0.9 % 200 mL IVPB     200 mg 78 mL/hr over 200 Minutes Intravenous  Once 05/14/17 0829 05/14/17 1325   05/13/17 1927  vancomycin (VANCOCIN) 1-5 GM/200ML-% IVPB    Comments:  Ward, Christa   : cabinet override      05/13/17 1927 05/14/17 0744   05/12/17 1400  ceFEPIme (MAXIPIME) 1 g in dextrose 5 % 50 mL IVPB     1 g 100 mL/hr over 30 Minutes Intravenous Every  8 hours 05/12/17 0939     05/12/17 0900  vancomycin (VANCOCIN) IVPB 1000 mg/200 mL premix     1,000 mg 200 mL/hr over 60 Minutes Intravenous Every 12 hours 05/12/17 0750     05/12/17 0830  aztreonam (AZACTAM) 2 GM IVPB     2 g 100 mL/hr over 30 Minutes Intravenous  Once 05/12/17 0800 05/12/17 0957   05/06/17 0916  vancomycin (VANCOCIN) IVPB 1000 mg/200 mL premix     1,000 mg 200 mL/hr over 60 Minutes Intravenous On call to O.R. 05/06/17 0916 05/06/17 1229      Medications: Scheduled Meds: . bisacodyl  10 mg Rectal Daily  . chlorhexidine gluconate (MEDLINE KIT)  15 mL Mouth Rinse BID  . Chlorhexidine Gluconate Cloth  6 each Topical Daily  . enoxaparin (LOVENOX) injection  40 mg Subcutaneous Q24H  . fentaNYL (SUBLIMAZE) injection  50 mcg Intravenous Once  . insulin aspart  0-15 Units Subcutaneous Q4H  . ipratropium-albuterol  3 mL Nebulization Q6H  . lip balm  1 application Topical BID  . mouth rinse  15 mL Mouth Rinse QID  . nicotine  21 mg Transdermal Daily  . pantoprazole (PROTONIX) IV  40 mg Intravenous Q24H  . thiamine  100 mg Oral Daily   Continuous Infusions: . anidulafungin    . ceFEPime (MAXIPIME) IV Stopped (05/15/17 1102)  . dextrose 5 % and 0.45 % NaCl with KCl 20 mEq/L 100 mL/hr at 05/15/17 0842  . fentaNYL infusion INTRAVENOUS 350 mcg/hr (05/15/17 0700)  . metronidazole    . propofol (DIPRIVAN) infusion 35 mcg/kg/min (05/15/17 0700)  . vancomycin Stopped (05/14/17 2148)   PRN Meds:.acetaminophen, acetaminophen, fentaNYL, fentaNYL, fentaNYL (SUBLIMAZE) injection, labetalol, magic mouthwash, midazolam, midazolam, ondansetron **OR** ondansetron (ZOFRAN) IV, sodium chloride flush  Mickeal Skinner, MD Pg# [phone] [facility]

## 2017-05-15 NOTE — Progress Notes (Signed)
Peripherally Inserted Central Catheter/Midline Placement  The IV Nurse has discussed with the patient and/or persons authorized to consent for the patient, the purpose of this procedure and the potential benefits and risks involved with this procedure.  The benefits include less needle sticks, lab draws from the catheter, and the patient may be discharged home with the catheter. Risks include, but not limited to, infection, bleeding, blood clot (thrombus formation), and puncture of an artery; nerve damage and irregular heartbeat and possibility to perform a PICC exchange if needed/ordered by physician.  Alternatives to this procedure were also discussed.  Bard Power PICC patient education guide, fact sheet on infection prevention and patient information card has been provided to patient /or left at bedside.    PICC/Midline Placement Documentation  PICC Triple Lumen 35/24/81 PICC Right Basilic 43 cm 0 cm (Active)  Indication for Insertion or Continuance of Line Administration of hyperosmolar/irritating solutions (i.e. TPN, Vancomycin, etc.) 05/15/2017  8:00 AM  Exposed Catheter (cm) 0 cm 05/15/2017  8:00 AM  Dressing Change Due 05/22/17 05/15/2017  8:00 AM       Jule Economy Horton 05/15/2017, 8:17 AM

## 2017-05-15 NOTE — Progress Notes (Addendum)
  Requested by patient's wife to check on patient since I was already in the intensive care unit.  I confirmed patient already seen by Dr. Kieth Brightly this morning.    Pulmonary critical care, Miss Groce, in room evaluating as well.  ICU nurse as well.    Patient remains on ventilator with moderate settings.  Not on pressors.  Decent urine output.  Creatinine normal.  Expected elevated white count with repeated surgeries.  Blood pressure soft needing volume but not requiring pressors.  Wound VAC in place with serosanguineous output.  No evidence of any purulence or succus or stool.  Patient opening eyes & BP good enough to tolerate sedation.  Expected acute on chronic anemia with hemoglobin eight.  Nothing concerning enough for  transfusion.  Considering everything, things are guardedly optimistic only one day out.  Again agreed with the plan of the ICU team & lead surgeon, Dr Kieth Brightly, of continued aggressive antibiotics, vent support, IV TNA nutrition, etc.    Hopefully if pressure stabilizes, can try some diuresis.  Hopefully at re-exploration in 48 hours, Dr Kieth Brightly will be able to do anastomosis as long as the patient's tissues do not break down further.    I noted the patient would be in hospital for several weeks at the least.  Maybe more than a month.  Probable skilled facility/rehabilitation on recovery.  Probable 6-12 months needed to recover from all of this at best.  Patient's wife noted "we have been through this before".  Concerned.  Appreciative of all the nursing in surgical care.  She expressed appreciation.   I discussed operative findings, updated the patient's status, discussed probable steps to recovery, and gave postoperative recommendations as outlined by leading surgeon, Dr Kieth Brightly, to the patient's spouse. & ICU RN  Recommendations were made.  Questions were answered.  She expressed understanding & appreciation.  Adin Hector, M.D., F.A.C.S. Gastrointestinal and  Minimally Invasive Surgery [facility] [address] [place], [place] [address] [phone] Main / Paging  05/15/2017

## 2017-05-16 ENCOUNTER — Inpatient Hospital Stay (HOSPITAL_COMMUNITY): Payer: Medicare Other

## 2017-05-16 DIAGNOSIS — K9189 Other postprocedural complications and disorders of digestive system: Secondary | ICD-10-CM

## 2017-05-16 DIAGNOSIS — J9601 Acute respiratory failure with hypoxia: Secondary | ICD-10-CM

## 2017-05-16 LAB — COMPREHENSIVE METABOLIC PANEL
ALBUMIN: 1.3 g/dL — AB (ref 3.5–5.0)
ALK PHOS: 80 U/L (ref 38–126)
ALT: 26 U/L (ref 17–63)
ANION GAP: 8 (ref 5–15)
AST: 43 U/L — ABNORMAL HIGH (ref 15–41)
BILIRUBIN TOTAL: 1.5 mg/dL — AB (ref 0.3–1.2)
BUN: 13 mg/dL (ref 6–20)
CALCIUM: 7.1 mg/dL — AB (ref 8.9–10.3)
CO2: 25 mmol/L (ref 22–32)
CREATININE: 0.89 mg/dL (ref 0.61–1.24)
Chloride: 106 mmol/L (ref 101–111)
GFR calc Af Amer: >60 mL/min (ref >60)
GFR calc non Af Amer: >60 mL/min (ref >60)
GLUCOSE: 125 mg/dL — AB (ref 65–99)
Potassium: 3.5 mmol/L (ref 3.5–5.1)
Sodium: 139 mmol/L (ref 135–145)
TOTAL PROTEIN: 4.3 g/dL — AB (ref 6.5–8.1)

## 2017-05-16 LAB — GLUCOSE, CAPILLARY
GLUCOSE-CAPILLARY: 140 mg/dL — AB (ref 65–99)
Glucose-Capillary: 130 mg/dL — ABNORMAL HIGH (ref 65–99)
Glucose-Capillary: 115 mg/dL — ABNORMAL HIGH (ref 65–99)
Glucose-Capillary: 113 mg/dL — ABNORMAL HIGH (ref 65–99)
Glucose-Capillary: 129 mg/dL — ABNORMAL HIGH (ref 65–99)

## 2017-05-16 LAB — CBC WITH DIFFERENTIAL/PLATELET
BASOS ABS: 0 10*3/uL (ref 0.0–0.1)
Basophils Relative: 0 %
EOS ABS: 0 10*3/uL (ref 0.0–0.7)
Eosinophils Relative: 0 %
HCT: 20.9 % — ABNORMAL LOW (ref 39.0–52.0)
Hemoglobin: 7.3 g/dL — ABNORMAL LOW (ref 13.0–17.0)
LYMPHS ABS: 1.9 10*3/uL (ref 0.7–4.0)
LYMPHS PCT: 7 %
MCH: 27.4 pg (ref 26.0–34.0)
MCHC: 34.9 g/dL (ref 30.0–36.0)
MCV: 78.6 fL (ref 78.0–100.0)
MONO ABS: 0.8 10*3/uL (ref 0.1–1.0)
Monocytes Relative: 3 %
NEUTROS ABS: 24.9 10*3/uL — AB (ref 1.7–7.7)
Neutrophils Relative %: 90 %
PLATELETS: 529 10*3/uL — AB (ref 150–400)
RBC: 2.66 MIL/uL — ABNORMAL LOW (ref 4.22–5.81)
RDW: 14.2 % (ref 11.5–15.5)
WBC MORPHOLOGY: INCREASED
WBC: 27.6 10*3/uL — ABNORMAL HIGH (ref 4.0–10.5)

## 2017-05-16 LAB — MAGNESIUM: Magnesium: 1.9 mg/dL (ref 1.7–2.4)

## 2017-05-16 LAB — PHOSPHORUS: Phosphorus: 3.6 mg/dL (ref 2.5–4.6)

## 2017-05-16 MED ORDER — POTASSIUM CHLORIDE 10 MEQ/50ML IV SOLN
10.0000 meq | INTRAVENOUS | Status: AC
  Administered 2017-05-16 (×3): 10 meq via INTRAVENOUS
  Filled 2017-05-16 (×3): qty 50

## 2017-05-16 MED ORDER — CHLORHEXIDINE GLUCONATE CLOTH 2 % EX PADS
6.0000 | MEDICATED_PAD | Freq: Every day | CUTANEOUS | Status: DC
  Administered 2017-05-16 – 2017-05-25 (×9): 6 via TOPICAL

## 2017-05-16 MED ORDER — M.V.I. ADULT IV INJ
INJECTION | INTRAVENOUS | Status: AC
  Administered 2017-05-16: 18:00:00 via INTRAVENOUS
  Filled 2017-05-16 (×2): qty 960

## 2017-05-16 NOTE — Progress Notes (Signed)
Progress Note: General Surgery Service   Assessment/Plan: Patient Active Problem List   Diagnosis Date Noted  . Hypokalemia 05/14/2017  . Anastomotic leak of intestine 05/14/2017  . Severe sepsis ([place]) 05/14/2017  . Wound dehiscence, surgical 05/14/2017  . Aspiration pneumonia ([place]) 05/14/2017  . Chronic narcotic use 05/10/2017  . Anxiety state 05/10/2017  . Intra-abdominal adhesions s/p SB resection 05/06/2017 05/09/2017  . H/O total adrenalectomy ([place]) 05/06/2017  . Right adrenal mass s/p adrenalectomy 05/06/2017 05/06/2017  . Throat symptom 03/18/2016  . Multinodular goiter 01/19/2016  . Hyperthyroidism 01/19/2016  . Essential hypertension   . Diarrhea 11/30/2014  . Anemia, iron deficiency 11/01/2014  . Leukocytosis 11/03/2013  . Tobacco abuse 07/26/2013  . COPD (chronic obstructive pulmonary disease) ([place]) 07/26/2013  . Left knee pain 07/26/2013  . Pain in joint, shoulder region 05/03/2013  . GERD 07/24/2008  . 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  Wbc down today.  Appreciate their assistance -continue vac to open abdomen -cont TPN -continue OG to LIS -planned return to OR tomorrow    LOS: 10 days  Chief Complaint/Subjective: OR Thur for damage control of intestinal leak with small bowel resection, PICC in place and receiving TPN  Objective: Vital signs in last 24 hours: Temp:  [98.6 F (37 C)-101.1 F (38.4 C)] 98.6 F (37 C) (07/21 0331) Pulse Rate:  [99-127] 104 (07/21 0700) Resp:  [17-31] 19 (07/21 0700) BP: (111-131)/(48-57) 131/57 (07/21 0456) SpO2:  [92 %-96 %] 95 % (07/21 0700) Arterial Line BP: (97-134)/(48-60) 122/52 (07/21 0700) FiO2 (%):  [40 %-50 %] 40 % (07/21 0700) Weight:  [98.8 kg (217 lb 13 oz)] 98.8 kg (217 lb 13 oz) (07/21 0500) Last BM  Date: 05/11/17  Intake/Output from previous day: 07/20 0701 - 07/21 0700 In: 4471 [I.V.:3491; IV Piggyback:980] Out: 7253 [Urine:1635; Emesis/NG output:1100; Drains:950] Intake/Output this shift: No intake/output data recorded.  Lungs: coarse R lower lung area, clear L lower lung area  Cardiovascular: tachycardic  Abd: vac in place with good suction, thin red/brown fluid, no erythema  Extremities: trace edema  Neuro: sedated, ventilated.  Opens eyes spontaneously   Lab Results: CBC   Recent Labs  05/15/17 0653 05/16/17 0542  WBC 33.9* 27.6*  HGB 8.4* 7.3*  HCT 24.6* 20.9*  PLT 525* 529*   BMET  Recent Labs  05/15/17 0653 05/16/17 0542  NA 140 139  K 4.3 3.5  CL 106 106  CO2 25 25  GLUCOSE 132* 125*  BUN 11 13  CREATININE 0.92 0.89  CALCIUM 7.3* 7.1*   PT/INR No results for input(s): LABPROT, INR in the last 72 hours. ABG  Recent Labs  05/14/17 0440 05/14/17 1659  PHART 7.450 7.382  HCO3 27.0 29.3*    Studies/Results:  Anti-infectives: Anti-infectives    Start     Dose/Rate Route Frequency Ordered Stop   05/15/17 1000  anidulafungin (ERAXIS) 100 mg in sodium chloride 0.9 % 100 mL IVPB  Status:  Discontinued     100 mg 78 mL/hr over 100 Minutes Intravenous Every 24 hours 05/14/17 0829 05/16/17 0901   05/15/17 1000  metroNIDAZOLE (FLAGYL) IVPB 500 mg     500 mg 100 mL/hr over 60 Minutes Intravenous Every 8 hours 05/15/17 0903     05/14/17 0900  anidulafungin (ERAXIS) 200 mg in sodium chloride 0.9 % 200 mL IVPB  200 mg 78 mL/hr over 200 Minutes Intravenous  Once 05/14/17 0829 05/14/17 1325   05/13/17 1927  vancomycin (VANCOCIN) 1-5 GM/200ML-% IVPB    Comments:  Ward, Christa   : cabinet override      05/13/17 1927 05/14/17 0744   05/12/17 1400  ceFEPIme (MAXIPIME) 1 g in dextrose 5 % 50 mL IVPB     1 g 100 mL/hr over 30 Minutes Intravenous Every 8 hours 05/12/17 0939     05/12/17 0900  vancomycin (VANCOCIN) IVPB 1000 mg/200 mL premix   Status:  Discontinued     1,000 mg 200 mL/hr over 60 Minutes Intravenous Every 12 hours 05/12/17 0750 05/16/17 0852   05/12/17 0830  aztreonam (AZACTAM) 2 GM IVPB     2 g 100 mL/hr over 30 Minutes Intravenous  Once 05/12/17 0800 05/12/17 0957   05/06/17 0916  vancomycin (VANCOCIN) IVPB 1000 mg/200 mL premix     1,000 mg 200 mL/hr over 60 Minutes Intravenous On call to O.R. 05/06/17 0916 05/06/17 1229      Medications: Scheduled Meds: . bisacodyl  10 mg Rectal Daily  . chlorhexidine gluconate (MEDLINE KIT)  15 mL Mouth Rinse BID  . Chlorhexidine Gluconate Cloth  6 each Topical Daily  . Chlorhexidine Gluconate Cloth  6 each Topical Q0600  . enoxaparin (LOVENOX) injection  40 mg Subcutaneous Q24H  . fentaNYL (SUBLIMAZE) injection  50 mcg Intravenous Once  . insulin aspart  0-15 Units Subcutaneous Q4H  . ipratropium-albuterol  3 mL Nebulization Q6H  . lip balm  1 application Topical BID  . mouth rinse  15 mL Mouth Rinse QID  . nicotine  21 mg Transdermal Daily  . pantoprazole (PROTONIX) IV  40 mg Intravenous Q24H  . thiamine  100 mg Oral Daily   Continuous Infusions: . Marland KitchenTPN (CLINIMIX-E) Adult    . ceFEPime (MAXIPIME) IV Stopped (05/16/17 0542)  . dextrose 5 % and 0.45 % NaCl with KCl 20 mEq/L 60 mL (05/16/17 0025)  . fentaNYL infusion INTRAVENOUS 375 mcg/hr (05/16/17 0700)  . metronidazole 500 mg (05/16/17 0930)  . potassium chloride    . propofol (DIPRIVAN) infusion 35 mcg/kg/min (05/16/17 0849)  . TPN (CLINIMIX) Adult without lytes 40 mL/hr at 05/15/17 1732   PRN Meds:.acetaminophen, acetaminophen, fentaNYL, fentaNYL, fentaNYL (SUBLIMAZE) injection, labetalol, magic mouthwash, midazolam, midazolam, ondansetron **OR** ondansetron (ZOFRAN) IV, sodium chloride flush  Rosario Adie, MD  [facility]

## 2017-05-16 NOTE — Progress Notes (Signed)
[facility] NOTE   Pharmacy Consult for TPN Indication: prolonged ileus, suspected small bowel leak  Patient Measurements: Body mass index is 31.25 kg/m. Filed Weights   05/06/17 1715 05/13/17 2100 05/16/17 0500  Weight: 203 lb 7.8 oz (92.3 kg) 216 lb 0.8 oz (98 kg) 217 lb 13 oz (98.8 kg)   HPI: 14 yoM admitted on 7/11 for enlarging adrenal mass and planned adrenalectomy.  Pharmacy is now consulted to dose TPN.  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  Planning for return to OR in 48-72h  Insulin requirements past 24 hours: 10 units SSI (no hx DM)  Current Nutrition: NPO  IVF: D5 0.45% with KCl 20 mEq/L at 60 ml/hr  Central access: PICC line ordered 7/19, placed on 7/20 TPN start date: 7/20  ASSESSMENT                                                                                                          Today:  - Glucose: rising a little with start of TPN but within goal < 150 with minimal SSI correction - Electrolytes:  K trended down overnight, Phos improved to wnl, Mag improved to wnl, CorrCa wnl. - Renal:  SCr 0.92 - LFTs:  Mild AST elevation, improving. Elevated Tbili improving.  - TGs:  296 (7/18), 266 (7/20) - Prealbumin:  <5 (7/20) - Propofol infusion(1.1 kcal/ml) continues at 35 mcg/kg/min : 19.4 mL/hr (provides ~512 kcal /24 hrs)   NUTRITIONAL GOALS                                                                                             RD recs (7/19):  134 Protein, 2453 Kcal Clinimix 5/15 at a goal rate of 115 ml/hr + 20% fat emulsion 240 ml/day to  provide: 138 g of protein and 2440 kCals per day meeting >100 % of protein and 99% of kCal needs   Based on ASPEN guidelines, while the patient meets ICU status the initiation of lipids is being delayed for 7 days or until transition out of ICU. Planned start date for lipids is 05/21/17.  Goal will be to meet 100% of the patient's protein needs and approximately 80% of their caloric needs. Clinimix 5/15 @ 115 mL/hr to provide 138 grams protein (>100 % of goal) and 1960 kcal (80% of goal)    PLAN  Now:  Give 69meq KCl IV today.   At 1800 today:  Change to Clinimix E 5/15 at 40 ml/hr - adding back electrolytes.  Hold lipids for the first 7 days.  Plan to advance as tolerated to the goal rate.  TPN to contain standard multivitamins daily and trace elements only M/W/F.  Cont IVF at 60 ml/hr.  Continue CBGs and SSI per MD orders.  TPN lab panels on Mondays & Thursdays.  BMET, Mag, and Phos x 3 days  Romeo Rabon, PharmD, pager [phone]. 05/16/2017,7:55 AM.

## 2017-05-16 NOTE — Progress Notes (Signed)
PULMONARY / CRITICAL CARE MEDICINE   Name: [name] [name] MRN: [id number] DOB: 1961/09/25    ADMISSION DATE:  05/06/2017 CONSULTATION DATE:  05/13/17  REFERRING MD:  Kieth Brightly  CHIEF COMPLAINT:  Vent Management  BRIEF SUMMARY:  [name] [name] is a 56 y.o. 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  On 7/18, he developed a leak therefore was taken back to OR for re-do of ex-lap and repair of leak.  He returned to the ICU on the vent and PCCM was consulted for vent management.   SUBJECTIVE:   Febrile day before WBC down  VITAL SIGNS: BP (!) 131/57   Pulse (!) 104   Temp 98.6 F (37 C) (Oral)   Resp 19   Ht 5\' 10"  (1.778 m)   Wt 98.8 kg (217 lb 13 oz)   SpO2 95%   BMI 31.25 kg/m   HEMODYNAMICS: CVP:  [7 mmHg-9 mmHg] 9 mmHg  VENTILATOR SETTINGS: Vent Mode: PRVC FiO2 (%):  [40 %-50 %] 40 % Set Rate:  [16 bmp] 16 bmp Vt Set:  [580 mL] 580 mL PEEP:  [10 cmH20] 10 cmH20 Plateau Pressure:  [20 cmH20-24 cmH20] 23 cmH20  INTAKE / OUTPUT: I/O last 3 completed shifts: In: 7275.6 [I.V.:5995.6; IV Piggyback:1280] Out: 9833 [Urine:2245; Emesis/NG output:1500; Drains:1425]   PHYSICAL EXAMINATION:  General:  In bed on vent HENT: NCAT ETT in place PULM: CTA B, vent supported breathing CV: RRR, no mgr GI: wound vac in place, large RUQ wound, no drainage MSK: normal bulk and tone Neuro: sedated on vent    LABS:  BMET  Recent Labs Lab 05/14/17 0348 05/14/17 1659 05/15/17 0653 05/16/17 0542  NA 138 141 140 139  K 3.8 3.5 4.3 3.5  CL 104  --  106 106  CO2 27  --  25 25  BUN 13  --  11 13  CREATININE 0.92  --  0.92 0.89  GLUCOSE 129*  --  132* 125*    Electrolytes  Recent Labs Lab 05/14/17 0348 05/15/17 0653 05/16/17 0542  CALCIUM 7.6* 7.3* 7.1*  MG 1.7 1.6* 1.9  PHOS 2.9 5.1*  3.6    CBC  Recent Labs Lab 05/14/17 0348 05/14/17 1659 05/15/17 0653 05/16/17 0542  WBC 22.8*  --  33.9* 27.6*  HGB 8.8* 8.8* 8.4* 7.3*  HCT 24.7* 26.0* 24.6* 20.9*  PLT 467*  --  525* 529*    Coag's No results for input(s): APTT, INR in the last 168 hours.  Sepsis Markers No results for input(s): LATICACIDVEN, PROCALCITON, O2SATVEN in the last 168 hours.  ABG  Recent Labs Lab 05/13/17 2140 05/14/17 0440 05/14/17 1659  PHART 7.323* 7.450 7.382  PCO2ART 55.6* 40.5 49.4*  PO2ART 98.0 90.3 187.0*    Liver Enzymes  Recent Labs Lab 05/15/17 0653 05/16/17 0542  AST 64* 43*  ALT 35 26  ALKPHOS 89 80  BILITOT 2.2* 1.5*  ALBUMIN 1.5* 1.3*    Cardiac Enzymes No results for input(s): TROPONINI, PROBNP in the last 168 hours.  Glucose  Recent Labs Lab 05/15/17 1139 05/15/17 1203 05/15/17 1556 05/15/17 1944 05/15/17 2306 05/16/17 0329  GLUCAP 138* 126* 122* 148* 135* 140*    Imaging Dg Chest Port 1 View  Result Date: 05/16/2017 CLINICAL DATA:  Respiratory failure EXAM: PORTABLE CHEST 1 VIEW COMPARISON:  05/15/2017 FINDINGS: Endotracheal tube  3 cm above the carina. NG tube in the stomach. Interval placement of right arm PICC tip in the SVC Bibasilar airspace disease unchanged. Negative for heart failure or effusion IMPRESSION: PICC tip in the SVC. Bibasilar airspace disease unchanged. Electronically Signed   By: Franchot Gallo M.D.   On: 05/16/2017 07:25     STUDIES:  CT A / P 7/18 > adynamic ileus, pneumoperitoneum, small post operative fluid collection at right adrenalectomy site without wall thickening or gas, small right pleural effusion. CXR 05/15/2017>>Persistent bilateral pulmonary infiltrates. Left pulmonary infiltrate has improved from prior exam .  CULTURES: Abdominal wound 7/18 >> Moderate Klebsiella pneumonia prelim BCx2 7/19 >>   ANTIBIOTICS: Vanc 7/17 >> 7/21 Cefepime 7/17 >> Eraxis 7/19 >> Flagyl 7/20>>   SIGNIFICANT EVENTS: 7/11   OR for lap converted to open right adrenalectomy, SBR with anastomosis, placement of wound vac. 7/18  Found to have leak > taken back to OR for redo of ex-lap.  Returned to ICU on vent. 7/19  Pt pulled NGT out, almost self extubated (pulled to 19, adjusted by RT) > cuff leak / tube exchanged 05/14/2017>> Back to OR [facility]>> intestinal leak/ ischemic intestine  LINES/TUBES: ETT 0/27 >>  PICC R basilic vein 2/53 >>   DISCUSSION: 56 y.o. 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  PCCM consulted for post operative vent management in setting of an open abdomen, leaking anastomosis.  ASSESSMENT / PLAN:  PULMONARY A: Acute respiratory failure with hypoxemia Atelectasis on CXR in R lung Smoker P:   Full mechanical vent support VAP prevention Maintain heavy sedation until extubated Wean PEEP now  CARDIOVASCULAR A:  No acute issues History of hypertension pheochromocytoma P:  Tele ICU monitoring of hemodynamics  RENAL A:   No acute issues P:   Monitor BMET and UOP Replace electrolytes as needed   GASTROINTESTINAL A:   Intestinal perforation during adrenalectomy Anastomosis leak Open abdomen P:   TNA per CCS Repeat OR 7/22 Pain control Monitor for stool output  HEMATOLOGIC A:   Anemia, no bleeding P:  Continue DVT prophylaxis Monitor for bleeding Transfuse for Hgb < 7gm/dL  INFECTIOUS A:   Peritonitis, klebsiella, WBC improving P:   Stop vanc Stop eraxis Continue cefepime and flagyl, likely narrow tomorrow post operatively  ENDOCRINE A:   Right adrenal mass - s/p adrenalectomy 7/11 Hx hypothyroidism P:   Monitor glucose  NEUROLOGIC A:   No acute issues P:   RASS goal 0 to -1 Continue propofol and fentanyl per PAD protocol  Family updated:  Update wife [name] on 7/21  Interdisciplinary Family Meeting v Palliative Care Meeting:  Due by: 05/19/17.  My cc time 32 minutes  Roselie Awkward,  MD [place] Pager: [phone] Cell: [phone] After 3pm or if no response, call [phone]

## 2017-05-17 ENCOUNTER — Inpatient Hospital Stay (HOSPITAL_COMMUNITY): Payer: Medicare Other | Admitting: Certified Registered Nurse Anesthetist

## 2017-05-17 ENCOUNTER — Encounter (HOSPITAL_COMMUNITY)
Admission: RE | Disposition: A | Discharge status: Home Health | Payer: Self-pay | Source: Ambulatory Visit | Attending: Surgery

## 2017-05-17 ENCOUNTER — Encounter (HOSPITAL_COMMUNITY): Payer: Self-pay | Admitting: Anesthesiology

## 2017-05-17 ENCOUNTER — Inpatient Hospital Stay (HOSPITAL_COMMUNITY): Payer: Medicare Other

## 2017-05-17 DIAGNOSIS — J9602 Acute respiratory failure with hypercapnia: Secondary | ICD-10-CM

## 2017-05-17 HISTORY — PX: LAPAROTOMY: SHX154

## 2017-05-17 LAB — HEMOGLOBIN AND HEMATOCRIT, BLOOD
HCT: 23 % — ABNORMAL LOW (ref 39.0–52.0)
Hemoglobin: 8.1 g/dL — ABNORMAL LOW (ref 13.0–17.0)

## 2017-05-17 LAB — PREPARE RBC (CROSSMATCH)

## 2017-05-17 LAB — BASIC METABOLIC PANEL
Anion gap: 7 (ref 5–15)
BUN: 11 mg/dL (ref 6–20)
CHLORIDE: 107 mmol/L (ref 101–111)
CO2: 26 mmol/L (ref 22–32)
CREATININE: 0.83 mg/dL (ref 0.61–1.24)
Calcium: 7.3 mg/dL — ABNORMAL LOW (ref 8.9–10.3)
GFR calc Af Amer: >60 mL/min (ref >60)
GFR calc non Af Amer: >60 mL/min (ref >60)
Glucose, Bld: 121 mg/dL — ABNORMAL HIGH (ref 65–99)
Potassium: 3.3 mmol/L — ABNORMAL LOW (ref 3.5–5.1)
SODIUM: 140 mmol/L (ref 135–145)

## 2017-05-17 LAB — MAGNESIUM: MAGNESIUM: 1.9 mg/dL (ref 1.7–2.4)

## 2017-05-17 LAB — GLUCOSE, CAPILLARY
Glucose-Capillary: 129 mg/dL — ABNORMAL HIGH (ref 65–99)
Glucose-Capillary: 128 mg/dL — ABNORMAL HIGH (ref 65–99)
Glucose-Capillary: 109 mg/dL — ABNORMAL HIGH (ref 65–99)
Glucose-Capillary: 115 mg/dL — ABNORMAL HIGH (ref 65–99)
Glucose-Capillary: 141 mg/dL — ABNORMAL HIGH (ref 65–99)

## 2017-05-17 LAB — TRIGLYCERIDES: TRIGLYCERIDES: 294 mg/dL — AB (ref <150)

## 2017-05-17 LAB — PHOSPHORUS: Phosphorus: 4.2 mg/dL (ref 2.5–4.6)

## 2017-05-17 SURGERY — LAPAROTOMY, EXPLORATORY
Anesthesia: General | Site: Abdomen

## 2017-05-17 MED ORDER — ROCURONIUM BROMIDE 50 MG/5ML IV SOSY
PREFILLED_SYRINGE | INTRAVENOUS | Status: AC
  Filled 2017-05-17: qty 5

## 2017-05-17 MED ORDER — SODIUM CHLORIDE 0.9 % IV SOLN
Freq: Once | INTRAVENOUS | Status: AC
  Administered 2017-05-17: 10:00:00 via INTRAVENOUS

## 2017-05-17 MED ORDER — ROCURONIUM BROMIDE 50 MG/5ML IV SOSY
PREFILLED_SYRINGE | INTRAVENOUS | Status: DC | PRN
  Administered 2017-05-17: 20 mg via INTRAVENOUS
  Administered 2017-05-17 (×2): 50 mg via INTRAVENOUS

## 2017-05-17 MED ORDER — MIDAZOLAM HCL 2 MG/2ML IJ SOLN
4.0000 mg | Freq: Once | INTRAMUSCULAR | Status: AC
  Administered 2017-05-17: 4 mg via INTRAVENOUS

## 2017-05-17 MED ORDER — ONDANSETRON HCL 4 MG/2ML IJ SOLN
INTRAMUSCULAR | Status: AC
  Filled 2017-05-17: qty 2

## 2017-05-17 MED ORDER — ONDANSETRON HCL 4 MG/2ML IJ SOLN
INTRAMUSCULAR | Status: DC | PRN
  Administered 2017-05-17: 4 mg via INTRAVENOUS

## 2017-05-17 MED ORDER — PROPOFOL 10 MG/ML IV BOLUS
INTRAVENOUS | Status: AC
  Filled 2017-05-17: qty 20

## 2017-05-17 MED ORDER — CLINIMIX E/DEXTROSE (5/15) 5 % IV SOLN
INTRAVENOUS | Status: DC
  Filled 2017-05-17: qty 1440

## 2017-05-17 MED ORDER — DEXAMETHASONE SODIUM PHOSPHATE 10 MG/ML IJ SOLN
INTRAMUSCULAR | Status: AC
  Filled 2017-05-17: qty 1

## 2017-05-17 MED ORDER — SODIUM CHLORIDE 0.9 % IV SOLN
1.0000 mg/h | INTRAVENOUS | Status: DC
  Administered 2017-05-17: 4 mg/h via INTRAVENOUS
  Administered 2017-05-17: 2 mg/h via INTRAVENOUS
  Administered 2017-05-18 – 2017-05-22 (×7): 4 mg/h via INTRAVENOUS
  Administered 2017-05-22: 2 mg/h via INTRAVENOUS
  Administered 2017-05-23: 5 mg/h via INTRAVENOUS
  Administered 2017-05-23: 4 mg/h via INTRAVENOUS
  Administered 2017-05-24 – 2017-05-28 (×12): 6 mg/h via INTRAVENOUS
  Administered 2017-05-28 – 2017-05-29 (×2): 5 mg/h via INTRAVENOUS
  Administered 2017-05-29: 4 mg/h via INTRAVENOUS
  Administered 2017-05-30 (×2): 5 mg/h via INTRAVENOUS
  Administered 2017-05-31: 4 mg/h via INTRAVENOUS
  Filled 2017-05-17 (×34): qty 5

## 2017-05-17 MED ORDER — 0.9 % SODIUM CHLORIDE (POUR BTL) OPTIME
TOPICAL | Status: DC | PRN
  Administered 2017-05-17 (×5): 1000 mL

## 2017-05-17 MED ORDER — KCL IN DEXTROSE-NACL 40-5-0.45 MEQ/L-%-% IV SOLN
INTRAVENOUS | Status: AC
  Administered 2017-05-17: 10:00:00 via INTRAVENOUS
  Filled 2017-05-17 (×2): qty 1000

## 2017-05-17 MED ORDER — THIAMINE HCL 100 MG/ML IJ SOLN
100.0000 mg | Freq: Every day | INTRAMUSCULAR | Status: DC
  Administered 2017-05-17 – 2017-06-27 (×42): 100 mg via INTRAVENOUS
  Filled 2017-05-17 (×42): qty 2

## 2017-05-17 MED ORDER — DEXAMETHASONE SODIUM PHOSPHATE 10 MG/ML IJ SOLN
INTRAMUSCULAR | Status: DC | PRN
  Administered 2017-05-17: 10 mg via INTRAVENOUS

## 2017-05-17 MED ORDER — MIDAZOLAM BOLUS VIA INFUSION
1.0000 mg | INTRAVENOUS | Status: DC | PRN
  Administered 2017-05-17 – 2017-05-18 (×2): 2 mg via INTRAVENOUS
  Filled 2017-05-17: qty 2

## 2017-05-17 MED ORDER — M.V.I. ADULT IV INJ
INTRAVENOUS | Status: DC
  Administered 2017-05-17: 18:00:00 via INTRAVENOUS
  Filled 2017-05-17: qty 1440

## 2017-05-17 MED ORDER — SODIUM CHLORIDE 0.9 % IV SOLN
1.0000 mg/h | INTRAVENOUS | Status: DC
  Administered 2017-05-17: 5 mg/h via INTRAVENOUS
  Administered 2017-05-17: 2 mg/h via INTRAVENOUS
  Administered 2017-05-18: 3 mg/h via INTRAVENOUS
  Administered 2017-05-18: 5 mg/h via INTRAVENOUS
  Administered 2017-05-18 – 2017-05-21 (×8): 6 mg/h via INTRAVENOUS
  Administered 2017-05-21: 8 mg/h via INTRAVENOUS
  Administered 2017-05-21 – 2017-05-22 (×2): 4 mg/h via INTRAVENOUS
  Administered 2017-05-22: 6 mg/h via INTRAVENOUS
  Administered 2017-05-22: 8 mg/h via INTRAVENOUS
  Administered 2017-05-23: 9 mg/h via INTRAVENOUS
  Administered 2017-05-23 (×2): 8 mg/h via INTRAVENOUS
  Administered 2017-05-24: 10 mg/h via INTRAVENOUS
  Administered 2017-05-24: 8 mg/h via INTRAVENOUS
  Administered 2017-05-24: 3 mg/h via INTRAVENOUS
  Administered 2017-05-25: 5 mg/h via INTRAVENOUS
  Administered 2017-05-25: 4 mg/h via INTRAVENOUS
  Administered 2017-05-26: 1 mg/h via INTRAVENOUS
  Filled 2017-05-17 (×27): qty 10

## 2017-05-17 MED ORDER — LACTATED RINGERS IV SOLN
INTRAVENOUS | Status: DC | PRN
  Administered 2017-05-17: 15:00:00 via INTRAVENOUS

## 2017-05-17 MED ORDER — PROPOFOL 1000 MG/100ML IV EMUL
5.0000 ug/kg/min | INTRAVENOUS | Status: DC
  Administered 2017-05-17: 5 ug/kg/min via INTRAVENOUS
  Administered 2017-05-18: 10 ug/kg/min via INTRAVENOUS
  Filled 2017-05-17 (×2): qty 100

## 2017-05-17 MED ORDER — POTASSIUM CHLORIDE 10 MEQ/50ML IV SOLN
10.0000 meq | INTRAVENOUS | Status: AC
  Administered 2017-05-17 (×4): 10 meq via INTRAVENOUS
  Filled 2017-05-17 (×4): qty 50

## 2017-05-17 MED ORDER — KCL IN DEXTROSE-NACL 40-5-0.45 MEQ/L-%-% IV SOLN
INTRAVENOUS | Status: AC
  Administered 2017-05-17 – 2017-05-19 (×3): via INTRAVENOUS
  Filled 2017-05-17: qty 1000

## 2017-05-17 MED ORDER — SODIUM CHLORIDE 0.9 % IV SOLN: Freq: Once | INTRAVENOUS | Status: DC

## 2017-05-17 MED ORDER — HYDROMORPHONE BOLUS VIA INFUSION
1.0000 mg | INTRAVENOUS | Status: DC | PRN
  Administered 2017-05-17: 1 mg via INTRAVENOUS
  Administered 2017-05-17: 2 mg via INTRAVENOUS
  Administered 2017-05-17 – 2017-05-28 (×101): 1 mg via INTRAVENOUS
  Filled 2017-05-17 (×4): qty 1

## 2017-05-17 MED ORDER — PROPOFOL 10 MG/ML IV BOLUS
INTRAVENOUS | Status: DC | PRN
  Administered 2017-05-17 (×2): 30 mg via INTRAVENOUS

## 2017-05-17 SURGICAL SUPPLY — 42 items
CATH FOLEY 2WAY SLVR 30CC 20FR (CATHETERS) ×4 IMPLANT
CHLORAPREP W/TINT 26ML (MISCELLANEOUS) ×2 IMPLANT
COVER MAYO STAND STRL (DRAPES) ×2 IMPLANT
COVER SURGICAL LIGHT HANDLE (MISCELLANEOUS) ×4 IMPLANT
DRAPE LAPAROSCOPIC ABDOMINAL (DRAPES) ×2 IMPLANT
DRAPE UTILITY XL STRL (DRAPES) ×2 IMPLANT
DRAPE WARM FLUID 44X44 (DRAPE) ×2 IMPLANT
DRSG OPSITE POSTOP 4X10 (GAUZE/BANDAGES/DRESSINGS) IMPLANT
DRSG OPSITE POSTOP 4X6 (GAUZE/BANDAGES/DRESSINGS) IMPLANT
DRSG OPSITE POSTOP 4X8 (GAUZE/BANDAGES/DRESSINGS) IMPLANT
ELECT BLADE 6.5 EXT (BLADE) ×2 IMPLANT
ELECT REM PT RETURN 15FT ADLT (MISCELLANEOUS) ×2 IMPLANT
GLOVE BIOGEL PI IND STRL 7.0 (GLOVE) ×1 IMPLANT
GLOVE BIOGEL PI INDICATOR 7.0 (GLOVE) ×1
GLOVE SURG SS PI 7.0 STRL IVOR (GLOVE) ×2 IMPLANT
GOWN STRL REUS W/TWL LRG LVL3 (GOWN DISPOSABLE) ×2 IMPLANT
GOWN STRL REUS W/TWL XL LVL3 (GOWN DISPOSABLE) ×2 IMPLANT
HANDLE SUCTION POOLE (INSTRUMENTS) ×1 IMPLANT
KIT BASIN OR (CUSTOM PROCEDURE TRAY) ×2 IMPLANT
PACK COLON (CUSTOM PROCEDURE TRAY) ×2 IMPLANT
PACK GENERAL/GYN (CUSTOM PROCEDURE TRAY) IMPLANT
RELOAD PROXIMATE 100 BLUE (MISCELLANEOUS) IMPLANT
RELOAD PROXIMATE 100MM BLUE (MISCELLANEOUS)
SEALER TISSUE X1 CVD JAW (INSTRUMENTS) IMPLANT
SPONGE LAP 18X18 X RAY DECT (DISPOSABLE) IMPLANT
STAPLER PROXIMATE 100MM BLUE (MISCELLANEOUS) IMPLANT
STAPLER VISISTAT 35W (STAPLE) IMPLANT
SUCTION POOLE HANDLE (INSTRUMENTS) ×2
SUCTION POOLE TIP (SUCTIONS) ×2 IMPLANT
SUT MNCRL AB 4-0 PS2 18 (SUTURE) IMPLANT
SUT PDS AB 0 CT1 36 (SUTURE) IMPLANT
SUT SILK 2 0 (SUTURE)
SUT SILK 2 0 SH CR/8 (SUTURE) ×2 IMPLANT
SUT SILK 2-0 18XBRD TIE 12 (SUTURE) IMPLANT
SUT SILK 3 0 (SUTURE)
SUT SILK 3 0 SH CR/8 (SUTURE) ×2 IMPLANT
SUT SILK 3-0 18XBRD TIE 12 (SUTURE) IMPLANT
TOWEL OR 17X26 10 PK STRL BLUE (TOWEL DISPOSABLE) IMPLANT
TOWEL OR NON WOVEN STRL DISP B (DISPOSABLE) IMPLANT
TRAY FOLEY W/METER SILVER 14FR (SET/KITS/TRAYS/PACK) IMPLANT
TRAY FOLEY W/METER SILVER 16FR (SET/KITS/TRAYS/PACK) IMPLANT
YANKAUER SUCT BULB TIP NO VENT (SUCTIONS) ×2 IMPLANT

## 2017-05-17 NOTE — Anesthesia Postprocedure Evaluation (Deleted)
Anesthesia Post Note  Patient: [name] [name]  Procedure(s) Performed: Procedure(s) (LRB): EXPLORATORY LAPAROTOMY, PLACEMENT OF ILEOSTOMY TUBES, PARTIAL CLOSURE OF FASCIA, WOUND VAC EXCHANGE (N/A)     Patient location during evaluation: ICU Anesthesia Type: General Level of consciousness: patient remains intubated per anesthesia plan Vital Signs Assessment: post-procedure vital signs reviewed and stable Respiratory status: patient on ventilator - see flowsheet for VS Cardiovascular status: stable Anesthetic complications: no    Last Vitals:  Vitals:   05/17/17 1300 05/17/17 1400  BP:  (!) 126/57  Pulse: 91 88  Resp: (!) 24 (!) 21  Temp:      Last Pain:  Vitals:   05/17/17 1221  TempSrc: Axillary  PainSc:    Pain Goal: Patients Stated Pain Goal: 2 (05/13/17 0544)               Macario Shear JR,[name]

## 2017-05-17 NOTE — Anesthesia Postprocedure Evaluation (Signed)
Anesthesia Post Note  Patient: [name] [name]  Procedure(s) Performed: Procedure(s) (LRB): EXPLORATORY LAPAROTOMY, PLACEMENT OF ILEOSTOMY TUBES, PARTIAL CLOSURE OF FASCIA, WOUND VAC EXCHANGE (N/A)     Patient location during evaluation: ICU Anesthesia Type: General Level of consciousness: patient remains intubated per anesthesia plan Pain management: pain level controlled Vital Signs Assessment: post-procedure vital signs reviewed and stable Respiratory status: patient on ventilator - see flowsheet for VS and patient remains intubated per anesthesia plan Cardiovascular status: stable Postop Assessment: no signs of nausea or vomiting Anesthetic complications: no    Last Vitals:  Vitals:   05/17/17 1400 05/17/17 1900  BP: (!) 126/57   Pulse: 88   Resp: (!) 21   Temp:  36.6 C    Last Pain:  Vitals:   05/17/17 1900  TempSrc: Axillary  PainSc:    Pain Goal: Patients Stated Pain Goal: 2 (05/13/17 0544)               [name] [name],[name] [name]

## 2017-05-17 NOTE — Progress Notes (Signed)
Progress Note: General Surgery Service   Assessment/Plan: Patient Active Problem List   Diagnosis Date Noted  . Hypokalemia 05/14/2017  . Anastomotic leak of intestine 05/14/2017  . Severe sepsis ([facility]) 05/14/2017  . Wound dehiscence, surgical 05/14/2017  . Aspiration pneumonia ([facility]) 05/14/2017  . Chronic narcotic use 05/10/2017  . Anxiety state 05/10/2017  . Intra-abdominal adhesions s/p SB resection 05/06/2017 05/09/2017  . H/O total adrenalectomy ([facility]) 05/06/2017  . Right adrenal mass s/p adrenalectomy 05/06/2017 05/06/2017  . Throat symptom 03/18/2016  . Multinodular goiter 01/19/2016  . Hyperthyroidism 01/19/2016  . Essential hypertension   . Diarrhea 11/30/2014  . Anemia, iron deficiency 11/01/2014  . Leukocytosis 11/03/2013  . Tobacco abuse 07/26/2013  . COPD (chronic obstructive pulmonary disease) ([facility]) 07/26/2013  . Left knee pain 07/26/2013  . Pain in joint, shoulder region 05/03/2013  . GERD 07/24/2008  . 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  Increased FiO2 last night.  Wbc up today.  Appreciate their assistance -continue vac to open abdomen -cont TPN -continue OG to LIS -planned return to OR today    LOS: 11 days  Chief Complaint/Subjective: OR Thur for damage control of intestinal leak with small bowel resection, PICC in place and receiving TPN  Objective: Vital signs in last 24 hours: Temp:  [99 F (37.2 C)-101.4 F (38.6 C)] 99.6 F (37.6 C) (07/22 0640) Pulse Rate:  [84-117] 84 (07/22 0818) Resp:  [19-28] 19 (07/22 0818) BP: (104-131)/(53-57) 104/53 (07/22 0818) SpO2:  [93 %-99 %] 99 % (07/22 0818) Arterial Line BP: (76-171)/(49-65) 95/53 (07/22 0640) FiO2 (%):  [40 %-60 %] 60 % (07/22 0820) Last BM Date: 05/11/17  Intake/Output from previous day: 07/21 0701 -  07/22 0700 In: 4473.5 [I.V.:3893.5; Blood:30; IV Piggyback:550] Out: 4580 [Urine:2480; Emesis/NG output:1000; Drains:1100] Intake/Output this shift: No intake/output data recorded.    Cardiovascular: tachycardic  Abd: vac in place with good suction, thin red/brown fluid, no erythema  Extremities: trace edema  Neuro: sedated, ventilated.  Opens eyes spontaneously   Lab Results: CBC   Recent Labs  05/16/17 0542 05/17/17 0357  WBC 27.6* 30.1*  HGB 7.3* 6.6*  HCT 20.9* 18.6*  PLT 529* 552*   BMET  Recent Labs  05/16/17 0542 05/17/17 0357  NA 139 140  K 3.5 3.3*  CL 106 107  CO2 25 26  GLUCOSE 125* 121*  BUN 13 11  CREATININE 0.89 0.83  CALCIUM 7.1* 7.3*   PT/INR No results for input(s): LABPROT, INR in the last 72 hours. ABG  Recent Labs  05/14/17 1659  PHART 7.382  HCO3 29.3*    Studies/Results:  Anti-infectives: Anti-infectives    Start     Dose/Rate Route Frequency Ordered Stop   05/15/17 1000  anidulafungin (ERAXIS) 100 mg in sodium chloride 0.9 % 100 mL IVPB  Status:  Discontinued     100 mg 78 mL/hr over 100 Minutes Intravenous Every 24 hours 05/14/17 0829 05/16/17 0901   05/15/17 1000  metroNIDAZOLE (FLAGYL) IVPB 500 mg     500 mg 100 mL/hr over 60 Minutes Intravenous Every 8 hours 05/15/17 0903     05/14/17 0900  anidulafungin (ERAXIS) 200 mg in sodium chloride 0.9 % 200 mL IVPB     200 mg 78 mL/hr over 200 Minutes Intravenous  Once 05/14/17 0829 05/14/17 1325   05/13/17 1927  vancomycin (VANCOCIN) 1-5 GM/200ML-%  IVPB    Comments:  Ward, Christa   : cabinet override      05/13/17 1927 05/14/17 0744   05/12/17 1400  ceFEPIme (MAXIPIME) 1 g in dextrose 5 % 50 mL IVPB     1 g 100 mL/hr over 30 Minutes Intravenous Every 8 hours 05/12/17 0939     05/12/17 0900  vancomycin (VANCOCIN) IVPB 1000 mg/200 mL premix  Status:  Discontinued     1,000 mg 200 mL/hr over 60 Minutes Intravenous Every 12 hours 05/12/17 0750 05/16/17 0852   05/12/17 0830   aztreonam (AZACTAM) 2 GM IVPB     2 g 100 mL/hr over 30 Minutes Intravenous  Once 05/12/17 0800 05/12/17 0957   05/06/17 0916  vancomycin (VANCOCIN) IVPB 1000 mg/200 mL premix     1,000 mg 200 mL/hr over 60 Minutes Intravenous On call to O.R. 05/06/17 0916 05/06/17 1229      Medications: Scheduled Meds: . bisacodyl  10 mg Rectal Daily  . chlorhexidine gluconate (MEDLINE KIT)  15 mL Mouth Rinse BID  . Chlorhexidine Gluconate Cloth  6 each Topical Daily  . Chlorhexidine Gluconate Cloth  6 each Topical Q0600  . enoxaparin (LOVENOX) injection  40 mg Subcutaneous Q24H  . fentaNYL (SUBLIMAZE) injection  50 mcg Intravenous Once  . insulin aspart  0-15 Units Subcutaneous Q4H  . ipratropium-albuterol  3 mL Nebulization Q6H  . lip balm  1 application Topical BID  . mouth rinse  15 mL Mouth Rinse QID  . nicotine  21 mg Transdermal Daily  . pantoprazole (PROTONIX) IV  40 mg Intravenous Q24H  . thiamine  100 mg Oral Daily   Continuous Infusions: . Marland KitchenTPN (CLINIMIX-E) Adult 40 mL/hr at 05/16/17 1739  . Marland KitchenTPN (CLINIMIX-E) Adult    . sodium chloride    . sodium chloride    . ceFEPime (MAXIPIME) IV Stopped (05/17/17 2297)  . dextrose 5 % and 0.45 % NaCl with KCl 40 mEq/L    . dextrose 5 % and 0.45 % NaCl with KCl 40 mEq/L    . fentaNYL infusion INTRAVENOUS 400 mcg/hr (05/17/17 0600)  . metronidazole Stopped (05/17/17 0350)  . potassium chloride 10 mEq (05/17/17 0754)  . propofol (DIPRIVAN) infusion 50 mcg/kg/min (05/17/17 0600)   PRN Meds:.acetaminophen, acetaminophen, fentaNYL, fentaNYL, fentaNYL (SUBLIMAZE) injection, labetalol, magic mouthwash, midazolam, midazolam, ondansetron **OR** ondansetron (ZOFRAN) IV, sodium chloride flush  Rosario Adie, MD  [facility]

## 2017-05-17 NOTE — Progress Notes (Signed)
Wasted 118mL IV Fentynl with Laurance Flatten RN

## 2017-05-17 NOTE — Transfer of Care (Signed)
Immediate Anesthesia Transfer of Care Note  Patient: [name] [name]  Procedure(s) Performed: Procedure(s): EXPLORATORY LAPAROTOMY, PLACEMENT OF ILEOSTOMY TUBES, PARTIAL CLOSURE OF FASCIA, WOUND VAC EXCHANGE (N/A)  Patient Location: ICU  Anesthesia Type:General  Level of Consciousness: sedated and Patient remains intubated per anesthesia plan  Airway & Oxygen Therapy: Patient placed on Ventilator (see vital sign flow sheet for setting)  Post-op Assessment: Report given to RN and Post -op Vital signs reviewed and stable  Post vital signs: Reviewed and stable  Last Vitals:  Vitals:   05/17/17 1300 05/17/17 1400  BP:  (!) 126/57  Pulse: 91 88  Resp: (!) 24 (!) 21  Temp:      Last Pain:  Vitals:   05/17/17 1221  TempSrc: Axillary  PainSc:       Patients Stated Pain Goal: 2 (76/14/70 9295)  Complications: No apparent anesthesia complications

## 2017-05-17 NOTE — H&P (Signed)
  Pre Procedure note for inpatients:   [name] [name] has been scheduled for Procedure(s): EXPLORATORY LAPAROTOMY, POSSIBLE REANASTOMOSIS, POSSIBLE BOWEL RESECTION, POSSIBLE WOUND VAC EXCHANGE (N/A) today. The various methods of treatment have been discussed with the patient. After consideration of the risks, benefits and treatment options the patient has consented to the planned procedure.   The patient has been seen and labs reviewed. There are no changes in the patient's condition to prevent proceeding with the planned procedure today.  Recent labs:  Lab Results  Component Value Date   WBC 30.1 (H) 05/17/2017   HGB 6.6 (LL) 05/17/2017   HCT 18.6 (L) 05/17/2017   PLT 552 (H) 05/17/2017   GLUCOSE 121 (H) 05/17/2017   CHOL 183 03/06/2015   TRIG 266 (H) 05/15/2017   HDL 34 (L) 03/06/2015   LDLCALC 73 03/06/2015   ALT 26 05/16/2017   AST 43 (H) 05/16/2017   NA 140 05/17/2017   K 3.3 (L) 05/17/2017   CL 107 05/17/2017   CREATININE 0.83 05/17/2017   BUN 11 05/17/2017   CO2 26 05/17/2017   TSH 0.552 05/14/2017   INR 1.00 05/04/2017    Mickeal Skinner, MD 05/17/2017 2:48 PM

## 2017-05-17 NOTE — Progress Notes (Signed)
[place] NOTE   Pharmacy Consult for TPN Indication: prolonged ileus, suspected small bowel leak  Patient Measurements: Body mass index is 31.25 kg/m. Filed Weights   05/06/17 1715 05/13/17 2100 05/16/17 0500  Weight: 203 lb 7.8 oz (92.3 kg) 216 lb 0.8 oz (98 kg) 217 lb 13 oz (98.8 kg)   HPI: 58 yoM admitted on 7/11 for enlarging adrenal mass and planned adrenalectomy.  Pharmacy is now consulted to dose TPN.  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  Planning for return to OR in 48-72h  Insulin requirements past 24 hours: 8 units SSI (no hx DM)  Current Nutrition: NPO  IVF: D5 0.45% with KCl 20 mEq/L at 60 ml/hr  Central access: PICC line ordered 7/19, placed on 7/20 TPN start date: 7/20  ASSESSMENT                                                                                                          Today:  - Glucose: within goal < 150 with minimal SSI correction - Electrolytes:  K still trending down despite 3 runs yesterday, Elink ordered 4 runs this AM, Phos, Mag, and CorrCa wnl. - Renal:  SCr wnl, stable. - LFTs:  (7/21)Mild AST elevation, improving. Elevated Tbili improving.  - TGs:  296 (7/18), 266 (7/20) - Prealbumin:  <5 (7/20) - Propofol infusion(1.1 kcal/ml) currently up to 50 mcg/kg/min : 27.7 mL/hr (provides ~730 kcal /24 hrs)   NUTRITIONAL GOALS                                                                                             RD recs (7/19):  134 Protein, 2453 Kcal Clinimix 5/15 at a goal rate of 115 ml/hr + 20% fat emulsion 240 ml/day to  provide: 138 g of protein and 2440 kCals per day meeting >100 % of protein and 99% of kCal needs   Based on ASPEN guidelines, while the patient meets ICU status the initiation of lipids is being delayed for 7 days or until transition out of ICU. Planned start date for lipids is 05/21/17.  Goal will be to meet 100% of the patient's protein needs and approximately 80% of their caloric needs. Clinimix 5/15 @ 115 mL/hr to provide 138 grams protein (>100 % of goal) and 1960 kcal (80% of goal)    PLAN  Now:  Increase KCL in IVF to 70meq/L, keep at 89ml/hr until 1800.   At 1800 today:  Increase Clinimix E 5/15 to 60 ml/hr.  Plan to advance as tolerated to the goal rate.  Hold lipids for the first 7 days. Getting Propofol.  TPN to contain standard multivitamins daily and trace elements only M/W/F.  Decrease IVF to 40 ml/hr.  Continue CBGs and SSI per MD orders.  TPN lab panels on Mondays & Thursdays.  BMET, Mag, and Phos x 3 days  Romeo Rabon, PharmD, pager [phone]. 05/17/2017,7:25 AM.

## 2017-05-17 NOTE — Progress Notes (Signed)
[facility] Progress Note Patient Name: [name] [name] DOB: 1961/06/01 MRN: [id number]   Date of Service  05/17/2017  HPI/Events of Note  Gradual drop in Hgb over several days.  Now 6.6.  Has wound vac in place.  Hypokalemia  eICU Interventions  Transfuse 1 u pRBC Post-transfusion CBC Potassium replaced     Intervention Category Intermediate Interventions: Other:  Oliviarose Punch 05/17/2017, 4:41 AM

## 2017-05-17 NOTE — Progress Notes (Signed)
PULMONARY / CRITICAL CARE MEDICINE   Name: [name] [name] MRN: [id number] DOB: 01-09-1961    ADMISSION DATE:  05/06/2017 CONSULTATION DATE:  05/13/17  REFERRING MD:  Kieth Brightly  CHIEF COMPLAINT:  Vent Management  BRIEF SUMMARY:  [name] [name] is a 56 y.o. 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  On 7/18, he developed a leak therefore was taken back to OR for re-do of ex-lap and repair of leak.  He returned to the ICU on the vent and PCCM was consulted for vent management.   SUBJECTIVE:   Febrile day before WBC down  VITAL SIGNS: BP 136/67   Pulse 86   Temp 97.6 F (36.4 C) (Axillary)   Resp 20   Ht 5\' 10"  (1.778 m)   Wt 98.8 kg (217 lb 13 oz)   SpO2 98%   BMI 31.25 kg/m   HEMODYNAMICS:    VENTILATOR SETTINGS: Vent Mode: PRVC FiO2 (%):  [40 %-60 %] 60 % Set Rate:  [16 bmp] 16 bmp Vt Set:  [580 mL] 580 mL PEEP:  [5 cmH20] 5 cmH20 Plateau Pressure:  [13 cmH20-20 cmH20] 19 cmH20  INTAKE / OUTPUT: I/O last 3 completed shifts: In: 6566.6 [I.V.:5586.6; Blood:30; IV Piggyback:950] Out: 4854 [Urine:3330; Emesis/NG output:1800; Drains:1550]   PHYSICAL EXAMINATION:  General:  In bed on vent HENT: NCAT ETT in place PULM: Rhonchi R>L, vent supported breathing CV: RRR, no mgr GI: BS+, soft, nontender MSK: normal bulk and tone Neuro: sedated on vent   LABS:  BMET  Recent Labs Lab 05/15/17 0653 05/16/17 0542 05/17/17 0357  NA 140 139 140  K 4.3 3.5 3.3*  CL 106 106 107  CO2 25 25 26   BUN 11 13 11   CREATININE 0.92 0.89 0.83  GLUCOSE 132* 125* 121*    Electrolytes  Recent Labs Lab 05/15/17 0653 05/16/17 0542 05/17/17 0357  CALCIUM 7.3* 7.1* 7.3*  MG 1.6* 1.9 1.9  PHOS 5.1* 3.6 4.2    CBC  Recent Labs Lab 05/15/17 0653 05/16/17 0542 05/17/17 0357  WBC 33.9*  27.6* 30.1*  HGB 8.4* 7.3* 6.6*  HCT 24.6* 20.9* 18.6*  PLT 525* 529* 552*    Coag's No results for input(s): APTT, INR in the last 168 hours.  Sepsis Markers No results for input(s): LATICACIDVEN, PROCALCITON, O2SATVEN in the last 168 hours.  ABG  Recent Labs Lab 05/13/17 2140 05/14/17 0440 05/14/17 1659  PHART 7.323* 7.450 7.382  PCO2ART 55.6* 40.5 49.4*  PO2ART 98.0 90.3 187.0*    Liver Enzymes  Recent Labs Lab 05/15/17 0653 05/16/17 0542  AST 64* 43*  ALT 35 26  ALKPHOS 89 80  BILITOT 2.2* 1.5*  ALBUMIN 1.5* 1.3*    Cardiac Enzymes No results for input(s): TROPONINI, PROBNP in the last 168 hours.  Glucose  Recent Labs Lab 05/16/17 0759 05/16/17 1557 05/16/17 1954 05/16/17 2314 05/17/17 0335 05/17/17 0805  GLUCAP 130* 115* 113* 129* 129* 128*    Imaging Dg Chest Port 1 View  Result Date: 05/17/2017 CLINICAL DATA:  Acute respiratory failure with hypoxemia EXAM: PORTABLE CHEST 1 VIEW COMPARISON:  05/16/2017 FINDINGS: Endotracheal tube in good position. NG tube in the stomach. PICC tip in the SVC. Progression of left upper lobe airspace disease with new airspace disease left upper lobe. Right lower lobe airspace disease unchanged. Progression of left upper lobe airspace  disease since the prior study. IMPRESSION: Progression of left lower lobe and left upper lobe airspace disease. No change right lower lobe airspace disease. Electronically Signed   By: Franchot Gallo M.D.   On: 05/17/2017 07:00     STUDIES:  CT A / P 7/18 > adynamic ileus, pneumoperitoneum, small post operative fluid collection at right adrenalectomy site without wall thickening or gas, small right pleural effusion. CXR 05/15/2017>>Persistent bilateral pulmonary infiltrates. Left pulmonary infiltrate has improved from prior exam    CULTURES: Abdominal wound 7/18 >> Moderate Klebsiella pneumonia prelim BCx2 7/19 >>  Resp culture 7/22 >  ANTIBIOTICS: Vanc 7/17 >> 7/21 Cefepime 7/17  >> Eraxis 7/19 >> Flagyl 7/20>>   SIGNIFICANT EVENTS: 7/11  OR for lap converted to open right adrenalectomy, SBR with anastomosis, placement of wound vac. 7/18  Found to have leak > taken back to OR for redo of ex-lap.  Returned to ICU on vent. 7/19  Pt pulled NGT out, almost self extubated (pulled to 19, adjusted by RT) > cuff leak / tube exchanged 05/14/2017>> Back to OR [facility]>> intestinal leak/ ischemic intestine  LINES/TUBES: ETT 5/73 >>  PICC R basilic vein 2/20 >>   DISCUSSION: 56 y.o. 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  PCCM consulted for post operative vent management in setting of an open abdomen, leaking anastomosis.  ASSESSMENT / PLAN:  PULMONARY A: Acute respiratory failure with hypoxemia Atelectasis on CXR in R lung > worsening infiltrate on my personal review of 7/22 CXR images Smoker P:   Full mechanical vent support VAP prevention Daily WUA/SBT Maintain heavy sedation until extubated Send resp cultures Continue pulm toilette measures  CARDIOVASCULAR A:  No acute issues History of hypertension pheochromocytoma P:  Tele Monitor hemodynamics in ICU  RENAL A:   Hypokalemia P:   Monitor BMET and UOP Replace K   GASTROINTESTINAL A:   Intestinal perforation during adrenalectomy Anastomosis leak Open abdomen P: TNA per CCS Back to OR today Continue pain control     HEMATOLOGIC A:   Anemia, no bleeding P:  Continue DVT prophylaxis with SCD Monitor for bleeding Transfuse today to maintain Hgb > 7gm/dL    INFECTIOUS A:   Peritonitis, klebsiella, WBC improving HCAP P:   Continue cefepime and flagyl Send respiratory culture   ENDOCRINE A:   Right adrenal mass - s/p adrenalectomy 7/11 Hx hypothyroidism P:   Continue to monitor glucose on labs  NEUROLOGIC A:   Appears to have developed tachyphylaxis to fentanyl/propofol as more awake on high doses of both  today P:   Change sedation from fentanyl and propofol to dilaudid and versed infusions RASS goal -2   Family updated:  Updated wife [name] on 7/22  Interdisciplinary Family Meeting v Palliative Care Meeting:  Due by: 05/19/17.  My cc time 33 minutes  Roselie Awkward, MD [place] PCCM Pager: [phone] Cell: [phone] After 3pm or if no response, call [phone]

## 2017-05-17 NOTE — Anesthesia Preprocedure Evaluation (Addendum)
Anesthesia Evaluation  Patient identified by MRN, date of birth, ID band  Reviewed: Allergy & Precautions, NPO status , Patient's Chart, lab work & pertinent test results, reviewed documented beta blocker date and time   Airway Mallampati: Intubated       Dental  (+) Teeth Intact   Pulmonary Current Smoker,     + decreased breath sounds  + intubated    Cardiovascular hypertension, Pt. on medications and Pt. on home beta blockers Normal cardiovascular exam Rhythm:Regular Rate:Normal     Neuro/Psych    GI/Hepatic   Endo/Other    Renal/GU      Musculoskeletal   Abdominal   Peds  Hematology   Anesthesia Other Findings   Reproductive/Obstetrics                            Anesthesia Physical Anesthesia Plan  ASA: III  Anesthesia Plan: General   Post-op Pain Management:    Induction: Inhalational  PONV Risk Score and Plan: 3 and Ondansetron, Dexamethasone, Propofol, Midazolam and Treatment may vary due to age or medical condition  Airway Management Planned: Oral ETT  Additional Equipment:   Intra-op Plan:   Post-operative Plan: Post-operative intubation/ventilation  Informed Consent: I have reviewed the patients History and Physical, chart, labs and discussed the procedure including the risks, benefits and alternatives for the proposed anesthesia with the patient or authorized representative who has indicated his/her understanding and acceptance.     Plan Discussed with: CRNA and Surgeon  Anesthesia Plan Comments:        Anesthesia Quick Evaluation

## 2017-05-17 NOTE — Progress Notes (Signed)
CRITICAL VALUE ALERT  Critical Value: Hgb 6.6  Date & Time Notied:  05/17/17  Provider Notified: Dr. [name]. Deterding- Elink  Orders Received/Actions taken: new orders received

## 2017-05-17 NOTE — Op Note (Signed)
Preoperative diagnosis: open abdomen, intestinal leak  Postoperative diagnosis: same   Procedure: reopening of recent laparotomy, creation of tube ileostomy, placement of 100cm2 negative pressure dressing  Surgeon: Gurney Maxin, M.D.  Asst: Kaylyn Lim  Anesthesia: general  Indications for procedure: [name] [name] is a 56 y.o. year old male with intestinal leak after adrenalectomy with open abdomen in discontinuity presents for .  Description of procedure: The patient was brought into the operative suite. Anesthesia was administered with General endotracheal anesthesia. WHO checklist was applied. The patient was then placed in supine position. The area was prepped and draped in the usual sterile fashion.  Next, the previous vac was taken down. The intestine appeared edematous and there was some fibrinous exudate on the visceral contents. No succus was encountered. The prolene and silk of the ends of intestine were identified. They were edematous and adhered to the surrounding intestines and mesentery. The area was carefully dissected briefly but do to the edema little mobility was created. Due to concern of further complication, the decision was made not to mobilize further or to create an anastomosis but rather to place tubes into the ends for venting with the plan of eventual closure of the abdomen and having to return weeks later in order to deal with the intestine not in an inflamed state.  Therefore, 2 incisions were made in the mid right abdomen and 20 Fr foley catheters were placed through the abdominal wall. Enterotomies were made on each of the intestinal ends and the balloons of the catheters were inflated with 16ml of water. The catheters were sutured in place with 2-0 silk at the skin. The distal intestinal enterotomy was larger than necessary and closed with interrupted 3-0 silk to fit snuggly against the tube.  The abdomen was then irrigated with 3L warm saline. The fascia was  closed for 2cm with 0 PDS in interrupted fashion. Further closure was not able due to the gap between fascia. The abtherra vac was put in place in standard fashion and had good suction.  Findings: edematous bowel throughout.   Specimen: none  Implant: 20Fr foley into distal ileum, 20Fr foley into proximal end of ileum   Blood loss: 139ml  Local anesthesia: none  Complications: unable to create intestinal continuity  Gurney Maxin, M.D. General, Bariatric, & Minimally Invasive Surgery [facility]

## 2017-05-17 NOTE — Anesthesia Postprocedure Evaluation (Deleted)
Anesthesia Post Note  Patient: [name] [name]  Procedure(s) Performed: Procedure(s) (LRB): EXPLORATORY LAPAROTOMY, PLACEMENT OF ILEOSTOMY TUBES, PARTIAL CLOSURE OF FASCIA, WOUND VAC EXCHANGE (N/A)     Anesthesia Post Evaluation  Last Vitals:  Vitals:   05/17/17 1300 05/17/17 1400  BP:  (!) 126/57  Pulse: 91 88  Resp: (!) 24 (!) 21  Temp:      Last Pain:  Vitals:   05/17/17 1221  TempSrc: Axillary  PainSc:    Pain Goal: Patients Stated Pain Goal: 2 (05/13/17 0544)               Ladelle Teodoro JR,[name]

## 2017-05-18 ENCOUNTER — Inpatient Hospital Stay (HOSPITAL_COMMUNITY): Payer: Medicare Other

## 2017-05-18 DIAGNOSIS — A419 Sepsis, unspecified organism: Secondary | ICD-10-CM

## 2017-05-18 LAB — BPAM RBC
BLOOD PRODUCT EXPIRATION DATE: 201808062359
BLOOD PRODUCT EXPIRATION DATE: 201808182359
Blood Product Expiration Date: 201808132359
ISSUE DATE / TIME: 201807220609
ISSUE DATE / TIME: 201807150728
ISSUE DATE / TIME: 201807220926
UNIT TYPE AND RH: 600
UNIT TYPE AND RH: 600
Unit Type and Rh: 600

## 2017-05-18 LAB — TYPE AND SCREEN
ABO/RH(D): A NEG
Antibody Screen: NEGATIVE
UNIT DIVISION: 0
UNIT DIVISION: 0
Unit division: 0

## 2017-05-18 LAB — CBC WITH DIFFERENTIAL/PLATELET
BASOS PCT: 0 %
BASOS PCT: 0 %
Basophils Absolute: 0 10*3/uL (ref 0.0–0.1)
Basophils Absolute: 0 10*3/uL (ref 0.0–0.1)
EOS ABS: 0 10*3/uL (ref 0.0–0.7)
Eosinophils Absolute: 0.3 10*3/uL (ref 0.0–0.7)
Eosinophils Relative: 1 %
Eosinophils Relative: 0 %
HEMATOCRIT: 18.6 % — AB (ref 39.0–52.0)
HEMATOCRIT: 25.6 % — AB (ref 39.0–52.0)
HEMOGLOBIN: 6.6 g/dL — AB (ref 13.0–17.0)
Hemoglobin: 8.7 g/dL — ABNORMAL LOW (ref 13.0–17.0)
LYMPHS PCT: 4 %
Lymphocytes Relative: 7 %
Lymphs Abs: 2 10*3/uL (ref 0.7–4.0)
Lymphs Abs: 1.2 10*3/uL (ref 0.7–4.0)
MCH: 27.7 pg (ref 26.0–34.0)
MCH: 27.4 pg (ref 26.0–34.0)
MCHC: 35.5 g/dL (ref 30.0–36.0)
MCHC: 34 g/dL (ref 30.0–36.0)
MCV: 78.2 fL (ref 78.0–100.0)
MCV: 80.8 fL (ref 78.0–100.0)
MONO ABS: 1.5 10*3/uL — AB (ref 0.1–1.0)
MONOS PCT: 5 %
Monocytes Absolute: 1.2 10*3/uL — ABNORMAL HIGH (ref 0.1–1.0)
Monocytes Relative: 4 %
NEUTROS ABS: 26.4 10*3/uL — AB (ref 1.7–7.7)
NEUTROS ABS: 26.4 10*3/uL — AB (ref 1.7–7.7)
Neutrophils Relative %: 88 %
Neutrophils Relative %: 92 %
PLATELETS: 552 10*3/uL — AB (ref 150–400)
PLATELETS: 568 10*3/uL — AB (ref 150–400)
RBC: 2.38 MIL/uL — ABNORMAL LOW (ref 4.22–5.81)
RBC: 3.17 MIL/uL — ABNORMAL LOW (ref 4.22–5.81)
RDW: 14 % (ref 11.5–15.5)
RDW: 14.8 % (ref 11.5–15.5)
WBC: 30.1 10*3/uL — ABNORMAL HIGH (ref 4.0–10.5)
WBC: 28.8 10*3/uL — ABNORMAL HIGH (ref 4.0–10.5)

## 2017-05-18 LAB — GLUCOSE, CAPILLARY
GLUCOSE-CAPILLARY: 114 mg/dL — AB (ref 65–99)
GLUCOSE-CAPILLARY: 120 mg/dL — AB (ref 65–99)
Glucose-Capillary: 123 mg/dL — ABNORMAL HIGH (ref 65–99)
Glucose-Capillary: 170 mg/dL — ABNORMAL HIGH (ref 65–99)
Glucose-Capillary: 135 mg/dL — ABNORMAL HIGH (ref 65–99)
Glucose-Capillary: 141 mg/dL — ABNORMAL HIGH (ref 65–99)
Glucose-Capillary: 130 mg/dL — ABNORMAL HIGH (ref 65–99)

## 2017-05-18 LAB — COMPREHENSIVE METABOLIC PANEL
ALK PHOS: 76 U/L (ref 38–126)
ALT: 22 U/L (ref 17–63)
ANION GAP: 7 (ref 5–15)
AST: 39 U/L (ref 15–41)
Albumin: 1.3 g/dL — ABNORMAL LOW (ref 3.5–5.0)
BILIRUBIN TOTAL: 0.9 mg/dL (ref 0.3–1.2)
BUN: 14 mg/dL (ref 6–20)
CALCIUM: 7.9 mg/dL — AB (ref 8.9–10.3)
CO2: 27 mmol/L (ref 22–32)
Chloride: 106 mmol/L (ref 101–111)
Creatinine, Ser: 0.71 mg/dL (ref 0.61–1.24)
GFR calc Af Amer: >60 mL/min (ref >60)
GFR calc non Af Amer: >60 mL/min (ref >60)
Glucose, Bld: 150 mg/dL — ABNORMAL HIGH (ref 65–99)
Potassium: 4.6 mmol/L (ref 3.5–5.1)
Sodium: 140 mmol/L (ref 135–145)
TOTAL PROTEIN: 4.9 g/dL — AB (ref 6.5–8.1)

## 2017-05-18 LAB — PREALBUMIN: Prealbumin: <5 mg/dL — ABNORMAL LOW (ref 18–38)

## 2017-05-18 LAB — PROCALCITONIN: PROCALCITONIN: 1.45 ng/mL

## 2017-05-18 LAB — MAGNESIUM: Magnesium: 1.9 mg/dL (ref 1.7–2.4)

## 2017-05-18 LAB — PHOSPHORUS: PHOSPHORUS: 4.3 mg/dL (ref 2.5–4.6)

## 2017-05-18 LAB — TRIGLYCERIDES: TRIGLYCERIDES: 269 mg/dL — AB (ref <150)

## 2017-05-18 MED ORDER — TRACE MINERALS CR-CU-MN-SE-ZN 10-1000-500-60 MCG/ML IV SOLN: INTRAVENOUS | Status: DC

## 2017-05-18 MED ORDER — MIDAZOLAM BOLUS VIA INFUSION
1.0000 mg | INTRAVENOUS | Status: DC | PRN
  Administered 2017-05-18: 2 mg via INTRAVENOUS
  Administered 2017-05-18 (×5): 1 mg via INTRAVENOUS
  Administered 2017-05-19 (×2): 2 mg via INTRAVENOUS
  Administered 2017-05-19: 1 mg via INTRAVENOUS
  Administered 2017-05-20: 2 mg via INTRAVENOUS
  Administered 2017-05-20: 4 mg via INTRAVENOUS
  Administered 2017-05-20 (×2): 2 mg via INTRAVENOUS
  Administered 2017-05-20: 4 mg via INTRAVENOUS
  Administered 2017-05-21: 1 mg via INTRAVENOUS
  Administered 2017-05-21: 2 mg via INTRAVENOUS
  Administered 2017-05-21: 3 mg via INTRAVENOUS
  Administered 2017-05-21 – 2017-05-22 (×5): 1 mg via INTRAVENOUS
  Administered 2017-05-22: 4 mg via INTRAVENOUS
  Administered 2017-05-22: 2 mg via INTRAVENOUS
  Administered 2017-05-22: 1 mg via INTRAVENOUS
  Administered 2017-05-22: 4 mg via INTRAVENOUS
  Administered 2017-05-23: 1 mg via INTRAVENOUS
  Administered 2017-05-23 (×2): 4 mg via INTRAVENOUS
  Administered 2017-05-23: 2 mg via INTRAVENOUS
  Administered 2017-05-23: 4 mg via INTRAVENOUS
  Administered 2017-05-23 (×4): 2 mg via INTRAVENOUS
  Administered 2017-05-23: 1 mg via INTRAVENOUS
  Administered 2017-05-23: 4 mg via INTRAVENOUS
  Administered 2017-05-23: 2 mg via INTRAVENOUS
  Administered 2017-05-23 (×2): 4 mg via INTRAVENOUS
  Administered 2017-05-23: 2 mg via INTRAVENOUS
  Administered 2017-05-24 (×9): 4 mg via INTRAVENOUS
  Administered 2017-05-25 (×2): 1 mg via INTRAVENOUS
  Administered 2017-05-25 (×4): 2 mg via INTRAVENOUS
  Administered 2017-05-25 (×2): 1 mg via INTRAVENOUS
  Administered 2017-05-25: 3 mg via INTRAVENOUS
  Administered 2017-05-26: 2 mg via INTRAVENOUS
  Filled 2017-05-18 (×2): qty 4

## 2017-05-18 MED ORDER — M.V.I. ADULT IV INJ
INJECTION | INTRAVENOUS | Status: AC
  Administered 2017-05-18: 18:00:00 via INTRAVENOUS
  Filled 2017-05-18: qty 1992

## 2017-05-18 MED ORDER — DEXMEDETOMIDINE HCL IN NACL 200 MCG/50ML IV SOLN
0.4000 ug/kg/h | INTRAVENOUS | Status: DC
  Administered 2017-05-18: 0.5 ug/kg/h via INTRAVENOUS
  Filled 2017-05-18: qty 50

## 2017-05-18 MED ORDER — M.V.I. ADULT IV INJ: INTRAVENOUS | Status: AC

## 2017-05-18 NOTE — Progress Notes (Signed)
S: fevers overnight, vac removed with discoloration and foul smelling drainage O: 97.9 124/73 HR 94 RR 18 92% on 3L  Gen: uncomfortable Neuro: AOx4 R: labored breathing Abd: TTP RUQ, wound with brown drainage  WBC 21.8 Hgb 9.4 Hct 26.7 Plt 449  Cr 0.86   A/P 56 yo male s/p adrenalectomy, wound concerning for abscess versus leak -CT scan -empiric abx -NPO  Addendum: CT scan concerning for complete dehiscence with leak -OR for exploration -likely going to ICU postop

## 2017-05-18 NOTE — Progress Notes (Signed)
Nutrition Follow-up  DOCUMENTATION CODES:   Not applicable  INTERVENTION:  - Continue TPN/TPN advancement per Pharmacy. - RD will continue to monitor for nutrition-related needs.  NUTRITION DIAGNOSIS:   Inadequate oral intake related to inability to eat as evidenced by NPO status. -ongoing  GOAL:   Patient will meet greater than or equal to 90% of their needs -unmet with current TPN rate.   MONITOR:   Vent status, Weight trends, Labs, Skin, I & O's, Other (Comment) (TPN regimen)  ASSESSMENT:   56 y.o. 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 On 7/18, he developed a leak therefore was taken back to OR for re-do of ex-lap and repair of leak.  7/23 Pt remains intubated with OGT in place; no output from OGT at this time. Pt returned to the OR yesterday for ex lap, placement of ileostomy tubes, partial closure of fascia, and wound vac exchange. Plan is for return to OR on 7/25 for full closure of abdominal fascia.   Weight +0.8 kg from 7/18-7/21 and no new weight since that time. Continue to use weight of 89.4 kg in estimating needs. Pt with triple lumen PICC and is currently receiving Clinimix E 5/15 @ 60 mL/hr which is providing 72 grams of protein and 1022 kcal. Plan to increase to Clinimix E 5/15 @ 83 mL/hr tonight at 1800. This rate will provide 100 grams of protein and 1414 kcal.   Per Pharmacy note, goal rate for TPN is Clinimix E 5/15 @ 115 mL/hr. This rate will provide 138 grams of protein (86% estimated protein need) and 1960 kcal (100% re-estimated minimum kcal need). Start date for ILE: 7/27  Patient is currently intubated on ventilator support MV: 9.7 L/min Temp (24hrs), Avg:98.2 F (36.8 C), Min:97.8 F (36.6 C), Max:99 F (37.2 C) Propofol: none BP: 102/58 and MAP: 71  Medications  reviewed; sliding scale Novolog, 40 mg IV Protonix/day, 100 mg IV thiamine/day.  Labs reviewed; CBGs: 135, 141, and 130 mg/dL this AM, Ca: 7.9 mg/dL.  IVF: D5-1/2 NS-40 mEq KCl @ 40 mL/hr (163 kcal from dextrose).  Drips: Versed @ 6 mg/hr, Dilaudid @ 4 mg/hr, Precedex @ 0.5 mcg/kg/hr.    7/20 - Pt remains intubated and OGT placed when pt returned to the OR yesterday.  - PICC not placed yesterday d/t return to the OR and was placed this AM at ~8:15 AM.  - Plan to start TPN today.  - Pt returned to the OR yesterday for intestinal leak with ischemic intestine.  - Estimated nutrition needs updated based on surgery and open abdomen.  - Needs based on weight from 7/9 (89.4 kg).  - Pt currently receiving 789 kcal from Propofol and IVF (D5).  - Per Pharmacy's note, plan to use electrolyte-free TPN d/t hyperphosphatemia.  - Plan for Clinimix 5/15 @ 40 mL/hr to start today at 1800. This regimen will provide 48 grams of protein and 682 kcal.   Per Dr. Marylou Mccoy note yesterday at 7:23 PM: reopening of recent laparotomy, lysis of adhesions, small bowel resection, placement of negative pressure dressing. Decision was made not to proceed with anastomosis or fascia closure but rather temporary vac closure device and planned reoperation in 72h.  Patient is currently intubated on ventilator support MV: 10.7 L/min Temp (24hrs), Avg:100.1 F (37.8 C), Min:98.8 F (37.1 C), Max:102.1 F (38.9 C)  Propofol: 20.6 ml/hr (544 kcal from fat) BP: 92/48 and MAP: 59 Phos: 5.1 mg/dL IVF: D5-1/2 NS-20 mEq KCl @ 60 mL/hr (245 kcal from dextrose). Drips: Propofol @ 35 mcg/kg/min; Fentanyl @ 350 mcg/hr.     7/19 - Weight had been 193-199 lbs from 03/06/15-05/04/17. - Weight from 05/04/17 (197 lbs/89.4 kg) used in estimating needs as weight +19 lbs/8.6 kg since that time.  - Surgical hx this admission as outlined above.  - Pt had NGT in place which he pulled ~ 7:35 AM today and no NGT/OGT in place at this time.  -  Family at bedside reports that PTA pt had a very good appetite with no changes until the day of admission.  - Following surgery on 05/06/17 pt was advanced to CLD then to Dysphagia 1, thin liquids then to Regular diet.  - Family reports minimal intakes during that time (x1 week).  - Per PCCM NP note this AM, possible need for TPN. - Family reports being informed of plan for PICC placement today and initiation of IV nutrition. - If TPN initiated, recommend goal of Clinimix E 5/20 @ 110 mL/hr (ILE to be held x7 days per ICU protocol). Without ILE, this regimen will provide 2323 kcal, 132 grams of protein.   Physical assessment shows no muscle and no fat wasting; mild edema to extremities.   Patient is currently intubated on ventilator support MV: 14.1L/min Temp (24hrs), Avg:100.4 F (38 C), Min:98 F (36.7 C), Max:102.8 F (39.3 C) Propofol: 14.69ml/hr (388 kcal from fat) BP: 100/48 and MAP: 60 Drips: Propofol @ 25 mcg/kg/min, Fentanyl @ 325 mcg/hr.    Diet Order:  Diet NPO time specified .TPN (CLINIMIX-E) Adult .TPN (CLINIMIX-E) Adult  Skin:  Wound (see comment) (Incisions to R flank and abdomen from 7/11 and 7/18), abdominal also from 7/19 and 7/22  Last BM:  7/16  Height:   Ht Readings from Last 1 Encounters:  05/14/17 5\' 10"  (1.778 m)    Weight:   Wt Readings from Last 1 Encounters:  05/16/17 217 lb 13 oz (98.8 kg)    Ideal Body Weight:  75.45 kg  BMI:  Body mass index is 31.25 kg/m.  Estimated Nutritional Needs:   Kcal:  3825-0539 ([employer] Equation + x1.2 stress factor)  Protein:  160 grams of protein (1.8 grams/kg)  Fluid:  2.4 L/day  EDUCATION NEEDS:   No education needs identified at this time    Jarome Matin, MS, RD, LDN, [facility] Inpatient Clinical Dietitian Pager # [phone] After hours/weekend pager # [phone]

## 2017-05-18 NOTE — Progress Notes (Signed)
patient refused to let nurse or nurse tech check blood sugar. Educated on importance and still refused. Will continue to monitor.

## 2017-05-18 NOTE — Progress Notes (Addendum)
[place] NOTE   Pharmacy Consult for TPN Indication: prolonged ileus, suspected small bowel leak  Patient Measurements: Body mass index is 31.25 kg/m. Filed Weights   05/06/17 1715 05/13/17 2100 05/16/17 0500  Weight: 203 lb 7.8 oz (92.3 kg) 216 lb 0.8 oz (98 kg) 217 lb 13 oz (98.8 kg)   HPI: 60 yoM admitted on 7/11 for enlarging adrenal mass and planned adrenalectomy.  Pharmacy is now consulted to dose TPN.  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  Planning for return to OR in 48-72h 7/23: per CCS notes, continue abxs and TPN, s/p EXPLORATORY LAPAROTOMY, PLACEMENT OF ILEOSTOMY TUBES, PARTIAL CLOSURE OF FASCIA, WOUND VAC EXCHANGE 05/17/2017 and plan to take back to OR on 7/25 for closure  Insulin requirements past 24 hours: 11 units SSI (no hx DM)  Current Nutrition: NPO  IVF: D5 1/2NS with 40 mEq KCL at 40 ml/hr  Central access: PICC line ordered 7/19, placed on 7/20 TPN start date: 7/20  ASSESSMENT                                                                                                            Today, 05/18/17 - Glucose: within goal < 150 with minimal SSI correction - Electrolytes: WNL - Renal:  SCr wnl, stable. - LFTs: WNL, tbili WNL - TGs:  296 (7/18), 266 (7/20), 269 stable (7/23) - Prealbumin:  <5 (7/20), not drawn 7/23 - will order 7/24 - Propofol infusion (1.1 kcal/ml) currently at rate of 9.9 mcg/kg/min : 5.9 mL/hr provides ~155 kcal /24 hrs   NUTRITIONAL GOALS                                                                                              RD recs (7/20):  160 Protein, 2280-2510 Kcal Clinimix 5/15 at a max fluid volume of 3L would be provide a rate of 125 ml/hr  + 20% fat emulsion 240 ml/day to provide: 153 g of protein and 2652 kCals per day meeting 100 % of protein and >100% of kCal needs BUT modified goal of 83 ml/min due to continued TPN shortages to provide 100g protein and 1912 kcal daily  Based on ASPEN guidelines, while the patient meets ICU status the initiation of lipids is being delayed for 7 days or until transition out of ICU. Planned  start date for lipids is 05/21/17.  Goal will be to meet 100% of the patient's protein needs and approximately 80% of their caloric needs. Clinimix 5/15 @ 125 mL/hr to provide 153 grams protein (100 % of goal) and 2129 kcal (>90% of goal)    PLAN                                                                                                                         At 1800 today:  Increase Clinimix E 5/15 to 83 ml/hr.  Plan to advance as tolerated to the goal rate.  Hold lipids for the first 7 days. Getting Propofol at 5.9 ml/hr to provide about 155 kcal/24hr and trying to transition off of propofol with start of precedex today  TPN to contain standard multivitamins daily and trace elements only M/W/F.  Continue IVF at 40 ml/hr.  Continue CBGs and SSI per MD orders.  TPN lab panels on Mondays & Thursdays.  BMET, Mag, and Phos x 3 days   Adrian Saran, PharmD, BCPS Pager [phone] 05/18/2017 10:29 AM

## 2017-05-18 NOTE — Progress Notes (Signed)
[facility] Attending Note  ADMISSION DATE:  05/06/2017  CONSULTATION DATE:  05/13/17  REFERRING MD:  Kieth Brightly  CHIEF COMPLAINT:  Vent Management  Presenting HPI:  56 y.o. 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 On 7/18, he developed a leak therefore was taken back to OR for re-do of ex-lap and repair of leak.  He returned to the ICU on the vent and PCCM was consulted for vent management.  Subjective:  No acute events overnight. Patient was continued thick secretions per nurse. Patient also with some agitation and still requiring propofol infusion despite Dilaudid and Versed. Arterial line reportedly very positional and correlating with cuff pressures well when it does work.   Review of Systems:  Unable to obtain given intubation & sedation.   Vent Mode: PRVC FiO2 (%):  [30 %-60 %] 60 % Set Rate:  [16 bmp] 16 bmp Vt Set:  [580 mL] 580 mL PEEP:  [5 cmH20] 5 cmH20 Plateau Pressure:  [12 cmH20-36 cmH20] 36 cmH20  Temp:  [97.8 F (36.6 C)-99 F (37.2 C)] 99 F (37.2 C) (07/23 0400) Pulse Rate:  [77-91] 80 (07/23 0900) Resp:  [9-25] 14 (07/23 0900) BP: (94-126)/(55-65) 94/57 (07/23 0900) SpO2:  [92 %-99 %] 97 % (07/23 0900) Arterial Line BP: (72-136)/(53-94) 72/65 (07/23 0900) FiO2 (%):  [30 %-60 %] 60 % (07/23 0814)  General:  No family at bedside. Intubated. No distress. Integument:  Warm & dry. No rash on exposed skin. Abdominal wound vac in place. HEENT:  Moist mucus memebranes. No scleral icterus. Endotracheal tube in place. Neurological:  Pupils symmetric and pinpoint. Sedated. No spontaneous movements. Musculoskeletal:  No joint effusion or erythema appreciated. Symmetric muscle bulk. Pulmonary:  Symmetric chest wall rise on ventilator. Coarse breath sounds  bilaterally. Cardiovascular:  Regular rate. No appreciable JVD. Normal S1 & S2. Telemetry:  Sinus rhythm. Abdomen:  Soft. Nondistended. Normoactive bowel sounds.  LINES/TUBES: OETT 07/18 >>> (tube exchanged 7/19 w/ cuff leak & attempted self-extubation) RUE TL PICC 7/20 >>> RAD ART LINE 7/18 >>> Foley 7/28 >>> OGT 7/19 >>>  CBC Latest Ref Rng & Units 05/18/2017 05/17/2017 05/17/2017  WBC 4.0 - 10.5 K/uL 28.8(H) - 30.1(H)  Hemoglobin 13.0 - 17.0 g/dL 8.7(L) 8.1(L) 6.6(LL)  Hematocrit 39.0 - 52.0 % 25.6(L) 23.0(L) 18.6(L)  Platelets 150 - 400 K/uL 568(H) - 552(H)    BMP Latest Ref Rng & Units 05/18/2017 05/17/2017 05/16/2017  Glucose 65 - 99 mg/dL 150(H) 121(H) 125(H)  BUN 6 - 20 mg/dL _0 Creatinine 0.61 - 1.24 mg/dL 0.71 0.83 0.89  Sodium 135 - 145 mmol/L 140 140 139  Potassium 3.5 - 5.1 mmol/L 4.6 3.3(L) 3.5  Chloride 101 - 111 mmol/L 106 107 106  CO2 22 - 32 mmol/L _1 Calcium 8.9 - 10.3 mg/dL 7.9(L) 7.3(L) 7.1(L)    Hepatic Function Latest Ref Rng & Units 05/18/2017 05/16/2017 05/15/2017  Total Protein 6.5 - 8.1 g/dL 4.9(L) 4.3(L) 4.4(L)  Albumin 3.5 - 5.0 g/dL 1.3(L) 1.3(L) 1.5(L)  AST 15 - 41 U/L 39 43(H) 64(H)  ALT 17 - 63 U/L 22 26 35  Alk Phosphatase 38 - 126 U/L 76 80 89  Total Bilirubin 0.3 - 1.2 mg/dL 0.9 1.5(H) 2.2(H)    IMAGING/STUDIES: CT ABD/PELVIS W/ CONTRAST 7/18: IMPRESSION: 1. Diffuse dilatation of the  small greater the large bowel with fluid levels throughout the small and large bowel, most compatible with adynamic ileus in the postoperative state. No bowel wall thickening. No discrete small bowel caliber transition to suggest obstruction. 2. Pneumoperitoneum in the right upper quadrant is presumably due to recent surgery . 3. Small postoperative fluid collection at the right adrenalectomy site without wall thickening or internal gas. 4. Small dependent right pleural effusion. 5. Patchy bibasilar lung consolidation and ground-glass attenuation with  some volume loss, concerning for multilobar pneumonia and/or aspiration with some atelectasis. PORT CXR 7/23:  Personally reviewed by me. Improving left upper lobe opacity. Enteric feeding tube coursing below diaphragm and seeming to occur towards the fundus of stomach. Endotracheal tube in good position above the carina. No new focal opacity appreciated. Persistent silhouetting left hemidiaphragm.  MICROBIOLOGY: MRSA PCR 7/11:  Negative Abdominal Wound Culture 7/18:  Klebsiella pneumoniae (resistant to Ampicillin) Blood Cultures x2 7/19 >>> Tracheal Aspirate Culture 7/22 >>>  ANTIBIOTICS: Aztreonam 7/17 (x1 dose) Vancomycin 7/17 - 7/21 Cefeime 7/17 >>> Eraxis 7/19 >>> Flagyl 7/20 >>>  SIGNIFICANT EVENTS: 07/11 - Admit for laparoscopic converted to open right adrenal ectomy w/  Small bowel resection and reanastamosis. Wound vac placed. 07/18 - Found to have leak >> taken back to OR for re-do exploratory laparotomy. Returned to ICU on vent. 07/19 - NGT pulled out and almost self-extubated. Developed cuff leak >> tube exchanged. Back to OR emergently for intestinal leak/ischemic bowel. 07/22 - Back to OR for wash out >> left with open abdomen & wound vac in place. Transfused 1u PRBC.  ASSESSMENT/PLAN:  56 y.o. male with sepsis secondary to intra-abdominal infection. Patient with persistent hypoxic respiratory failure.  1. Sepsis: Secondary to intra-abdominal infection secondary to anastomotic leak resulting in peritonitis. Continuing broad-spectrum antibiotic coverage with cefepime, Eraxis, and Flagyl. Awaiting finalization of cultures. Trending Procalcitonin per algorithm. 2. Acute hypoxic respiratory failure: Continuing full ventilator support pending abdominal closure. Holding on pressure support wean and spontaneous breathing trial. Continuing Duonebs every 6 hours. Holding on diuresis until patient closer to extubation. 3. Possible HCAP:  Continuing broad-spectrum antibiotics. Awaiting  tracheal aspirate culture. 4. Essential hypertension: History of pheochromocytoma now status post resection. Monitoring vitals as per unit protocol. Discontinuing arterial line.  5. Intestinal perforation: During adrenalectomy. Status post resection and reanastomosis by general surgery. Open abdomen with wound VAC in place. Care as per surgery service. 6. Anemia: No signs of active bleeding. Trending cell counts daily with CBC. Status post 1 unit packed red blood cells on 7/22. 7. History of hypothyroidism:  TSH 0.552 on 7/19. 8. Tachyphylaxis: Occurred while on propofol and fentanyl dose. Continuing patient on Dilaudid and Versed infusions. Attempting to transition from propofol to Precedex infusion. 9. Hypokalemia: Resolved. Monitoring electrolytes closely. 10. Hyperglycemia: Monitoring closely. Continuing Accu-Cheks every 4 hours with sliding scale insulin as per moderate algorithm. 11. Protein calorie malnutrition: Continuing TPN as per pharmacy dosing. 12. Tobacco use disorder: Continuing NicoDerm 21 mg patch.  Prophylaxis:  SCDs, Lovenox [place] q24hr, & Protonix IV q24hr.  Diet:  NPO. Currently on TPN. Code Status:  Full Code per previous physician discussions. Disposition:  Patient remains intubated & sedated pending closure of abdomen. Family Update: No family at bedside at the time of my rounds.  I have personally spent a total of 33 minutes of critical care time today caring for the patient & reviewing the patient's electronic medical record.  Remainder of care as per primary service & other consultants.   Creig Hines  Earney Hamburg, M.D. [facility] Pager:  [phone] After 3pm or if no response, call [phone] 9:57 AM 05/18/17

## 2017-05-18 NOTE — Care Management Note (Signed)
Case Management Note  Patient Details  Name: [name] [name] MRN: [id number] Date of Birth: 10/21/1961  Subjective/Objective:     POD 1-bowel resection               Action/Plan: Date:  May 18 2017  Chart reviewed for concurrent status and case management needs.  Will continue to follow patient progress.  Discharge Planning: following for needs  Expected discharge date: [id number]  [name], BSN, [place], [place]   Expected Discharge Date:                  Expected Discharge Plan:  Home/Self Care  In-House Referral:     Discharge planning Services  CM Consult  Post Acute Care Choice:    Choice offered to:     DME Arranged:    DME Agency:     HH Arranged:    HH Agency:     Status of Service:  In process, will continue to follow  If discussed at Long Length of Stay Meetings, dates discussed:    Additional Comments:  Leeroy Cha, RN 05/18/2017, 9:10 AM

## 2017-05-18 NOTE — Progress Notes (Signed)
Progress Note: General Surgery Service   Assessment/Plan: Patient Active Problem List   Diagnosis Date Noted  . Hypokalemia 05/14/2017  . Anastomotic leak of intestine 05/14/2017  . Severe sepsis ([place]) 05/14/2017  . Wound dehiscence, surgical 05/14/2017  . Aspiration pneumonia ([place]) 05/14/2017  . Chronic narcotic use 05/10/2017  . Anxiety state 05/10/2017  . Intra-abdominal adhesions s/p SB resection 05/06/2017 05/09/2017  . H/O total adrenalectomy ([place]) 05/06/2017  . Right adrenal mass s/p adrenalectomy 05/06/2017 05/06/2017  . Throat symptom 03/18/2016  . Multinodular goiter 01/19/2016  . Hyperthyroidism 01/19/2016  . Essential hypertension   . Diarrhea 11/30/2014  . Anemia, iron deficiency 11/01/2014  . Leukocytosis 11/03/2013  . Tobacco abuse 07/26/2013  . COPD (chronic obstructive pulmonary disease) ([place]) 07/26/2013  . Left knee pain 07/26/2013  . Pain in joint, shoulder region 05/03/2013  . GERD 07/24/2008  . 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   s/p Procedure(s): EXPLORATORY LAPAROTOMY, PLACEMENT OF ILEOSTOMY TUBES, PARTIAL CLOSURE OF FASCIA, WOUND VAC EXCHANGE 05/17/2017 WBC slight down 28 from 30, ileal drain with 112m output, vac in place, rhonchus with continued vent requirement -continue abx -flush drain tubes with 2105mwater q12h -continue TPN -plan for take back 7/25 for closure    LOS: 12 days  Chief Complaint/Subjective: No events overnight, NT suctioned this morning with moderate amount of output  Objective: Vital signs in last 24 hours: Temp:  [97.5 F (36.4 C)-99 F (37.2 C)] 99 F (37.2 C) (07/23 0400) Pulse Rate:  [77-91] 80 (07/23 0900) Resp:  [9-25] 14 (07/23 0900) BP: (94-126)/(55-65) 94/57 (07/23 0900) SpO2:  [92 %-99 %] 97 % (07/23 0900) Arterial Line BP: (72-136)/(53-94) 72/65 (07/23 0900) FiO2 (%):  [30 %-60 %] 60 % (07/23 0814) Last BM Date: 05/11/17  Intake/Output from previous day: 07/22 0701 - 07/23 0700 In: 5212.7  [I.V.:3984; Blood:778.7; IV Piggyback:450] Out: 367654Urine:2425; Emesis/NG output:400; Drains:775; Blood:25] Intake/Output this shift: Total I/O In: 337.8 [I.V.:337.8] Out: -   Lungs: rhonchus b/l  Cardiovascular: RRR  Abd: soft, vac in place functional, drain tubes in place with minimal output in distal and small amount output in proximal  Extremities: trace edema  Neuro: intubated, sedated  Lab Results: CBC   Recent Labs  05/17/17 0357 05/17/17 1457 05/18/17 0534  WBC 30.1*  --  28.8*  HGB 6.6* 8.1* 8.7*  HCT 18.6* 23.0* 25.6*  PLT 552*  --  568*   BMET  Recent Labs  05/17/17 0357 05/18/17 0534  NA 140 140  K 3.3* 4.6  CL 107 106  CO2 26 27  GLUCOSE 121* 150*  BUN 11 14  CREATININE 0.83 0.71  CALCIUM 7.3* 7.9*   PT/INR No results for input(s): LABPROT, INR in the last 72 hours. ABG No results for input(s): PHART, HCO3 in the last 72 hours.  Invalid input(s): PCO2, PO2  Studies/Results:  Anti-infectives: Anti-infectives    Start     Dose/Rate Route Frequency Ordered Stop   05/15/17 1000  anidulafungin (ERAXIS) 100 mg in sodium chloride 0.9 % 100 mL IVPB  Status:  Discontinued     100 mg 78 mL/hr over 100 Minutes Intravenous Every 24 hours 05/14/17 0829 05/16/17 0901   05/15/17 1000  metroNIDAZOLE (FLAGYL) IVPB 500 mg     500 mg 100 mL/hr over 60 Minutes Intravenous Every 8 hours 05/15/17 0903     05/14/17 0900  anidulafungin (ERAXIS) 200 mg in sodium chloride 0.9 % 200 mL IVPB     200 mg 78 mL/hr over  200 Minutes Intravenous  Once 05/14/17 0829 05/14/17 1325   05/13/17 1927  vancomycin (VANCOCIN) 1-5 GM/200ML-% IVPB    Comments:  Ward, Christa   : cabinet override      05/13/17 1927 05/14/17 0744   05/12/17 1400  ceFEPIme (MAXIPIME) 1 g in dextrose 5 % 50 mL IVPB     1 g 100 mL/hr over 30 Minutes Intravenous Every 8 hours 05/12/17 0939     05/12/17 0900  vancomycin (VANCOCIN) IVPB 1000 mg/200 mL premix  Status:  Discontinued     1,000 mg 200  mL/hr over 60 Minutes Intravenous Every 12 hours 05/12/17 0750 05/16/17 0852   05/12/17 0830  aztreonam (AZACTAM) 2 GM IVPB     2 g 100 mL/hr over 30 Minutes Intravenous  Once 05/12/17 0800 05/12/17 0957   05/06/17 0916  vancomycin (VANCOCIN) IVPB 1000 mg/200 mL premix     1,000 mg 200 mL/hr over 60 Minutes Intravenous On call to O.R. 05/06/17 0916 05/06/17 1229      Medications: Scheduled Meds: . bisacodyl  10 mg Rectal Daily  . chlorhexidine gluconate (MEDLINE KIT)  15 mL Mouth Rinse BID  . Chlorhexidine Gluconate Cloth  6 each Topical Daily  . Chlorhexidine Gluconate Cloth  6 each Topical Q0600  . enoxaparin (LOVENOX) injection  40 mg Subcutaneous Q24H  . insulin aspart  0-15 Units Subcutaneous Q4H  . ipratropium-albuterol  3 mL Nebulization Q6H  . lip balm  1 application Topical BID  . mouth rinse  15 mL Mouth Rinse QID  . nicotine  21 mg Transdermal Daily  . pantoprazole (PROTONIX) IV  40 mg Intravenous Q24H  . thiamine injection  100 mg Intravenous Daily   Continuous Infusions: . Marland KitchenTPN (CLINIMIX-E) Adult 60 mL/hr at 05/18/17 0900  . sodium chloride    . ceFEPime (MAXIPIME) IV Stopped (05/18/17 0606)  . dextrose 5 % and 0.45 % NaCl with KCl 40 mEq/L 40 mL/hr at 05/18/17 0900  . HYDROmorphone 4 mg/hr (05/18/17 0900)  . metronidazole Stopped (05/18/17 0310)  . midazolam (VERSED) infusion 5 mg/hr (05/18/17 0900)  . propofol (DIPRIVAN) infusion 9.953 mcg/kg/min (05/18/17 0900)   PRN Meds:.acetaminophen, acetaminophen, fentaNYL (SUBLIMAZE) injection, HYDROmorphone, labetalol, magic mouthwash, midazolam, ondansetron **OR** ondansetron (ZOFRAN) IV, sodium chloride flush  Mickeal Skinner, MD Pg# [phone] [facility]

## 2017-05-19 DIAGNOSIS — R6520 Severe sepsis without septic shock: Secondary | ICD-10-CM

## 2017-05-19 LAB — CULTURE, BLOOD (ROUTINE X 2)
Culture: NO GROWTH
Culture: NO GROWTH
SPECIAL REQUESTS: ADEQUATE
Special Requests: ADEQUATE

## 2017-05-19 LAB — CBC WITH DIFFERENTIAL/PLATELET
BASOS PCT: 0 %
Basophils Absolute: 0 10*3/uL (ref 0.0–0.1)
EOS ABS: 0.2 10*3/uL (ref 0.0–0.7)
EOS PCT: 1 %
HCT: 23.8 % — ABNORMAL LOW (ref 39.0–52.0)
HEMOGLOBIN: 8.2 g/dL — AB (ref 13.0–17.0)
LYMPHS PCT: 15 %
Lymphs Abs: 3 10*3/uL (ref 0.7–4.0)
MCH: 28.2 pg (ref 26.0–34.0)
MCHC: 34.5 g/dL (ref 30.0–36.0)
MCV: 81.8 fL (ref 78.0–100.0)
MONOS PCT: 7 %
Monocytes Absolute: 1.4 10*3/uL — ABNORMAL HIGH (ref 0.1–1.0)
NEUTROS PCT: 77 %
Neutro Abs: 15.6 10*3/uL — ABNORMAL HIGH (ref 1.7–7.7)
PLATELETS: 668 10*3/uL — AB (ref 150–400)
RBC: 2.91 MIL/uL — ABNORMAL LOW (ref 4.22–5.81)
RDW: 15.1 % (ref 11.5–15.5)
WBC: 20.2 10*3/uL — ABNORMAL HIGH (ref 4.0–10.5)

## 2017-05-19 LAB — BASIC METABOLIC PANEL
Anion gap: 7 (ref 5–15)
BUN: 18 mg/dL (ref 6–20)
CALCIUM: 7.7 mg/dL — AB (ref 8.9–10.3)
CHLORIDE: 106 mmol/L (ref 101–111)
CO2: 29 mmol/L (ref 22–32)
CREATININE: 0.71 mg/dL (ref 0.61–1.24)
GFR calc non Af Amer: >60 mL/min (ref >60)
Glucose, Bld: 118 mg/dL — ABNORMAL HIGH (ref 65–99)
Potassium: 3.7 mmol/L (ref 3.5–5.1)
SODIUM: 142 mmol/L (ref 135–145)

## 2017-05-19 LAB — GLUCOSE, CAPILLARY
GLUCOSE-CAPILLARY: 118 mg/dL — AB (ref 65–99)
GLUCOSE-CAPILLARY: 115 mg/dL — AB (ref 65–99)
GLUCOSE-CAPILLARY: 118 mg/dL — AB (ref 65–99)
GLUCOSE-CAPILLARY: 117 mg/dL — AB (ref 65–99)
Glucose-Capillary: 125 mg/dL — ABNORMAL HIGH (ref 65–99)
Glucose-Capillary: 109 mg/dL — ABNORMAL HIGH (ref 65–99)

## 2017-05-19 LAB — RENAL FUNCTION PANEL
ANION GAP: 7 (ref 5–15)
Albumin: 1.4 g/dL — ABNORMAL LOW (ref 3.5–5.0)
BUN: 18 mg/dL (ref 6–20)
CHLORIDE: 107 mmol/L (ref 101–111)
CO2: 29 mmol/L (ref 22–32)
Calcium: 7.7 mg/dL — ABNORMAL LOW (ref 8.9–10.3)
Creatinine, Ser: 0.68 mg/dL (ref 0.61–1.24)
GFR calc Af Amer: >60 mL/min (ref >60)
GFR calc non Af Amer: >60 mL/min (ref >60)
GLUCOSE: 120 mg/dL — AB (ref 65–99)
PHOSPHORUS: 3.8 mg/dL (ref 2.5–4.6)
POTASSIUM: 3.7 mmol/L (ref 3.5–5.1)
Sodium: 143 mmol/L (ref 135–145)

## 2017-05-19 LAB — MAGNESIUM: Magnesium: 1.8 mg/dL (ref 1.7–2.4)

## 2017-05-19 LAB — CULTURE, RESPIRATORY W GRAM STAIN: Special Requests: NORMAL

## 2017-05-19 LAB — PREALBUMIN: Prealbumin: 6.1 mg/dL — ABNORMAL LOW (ref 18–38)

## 2017-05-19 LAB — CULTURE, RESPIRATORY: GRAM STAIN: NONE SEEN

## 2017-05-19 LAB — PROCALCITONIN: Procalcitonin: 1 ng/mL

## 2017-05-19 MED ORDER — M.V.I. ADULT IV INJ
INTRAVENOUS | Status: AC
  Administered 2017-05-19: 18:00:00 via INTRAVENOUS
  Filled 2017-05-19 (×2): qty 1992

## 2017-05-19 NOTE — Progress Notes (Signed)
[facility] NOTE   Pharmacy Consult for TPN Indication: prolonged ileus, suspected small bowel leak  Patient Measurements: Body mass index is 31.25 kg/m. Filed Weights   05/06/17 1715 05/13/17 2100 05/16/17 0500  Weight: 203 lb 7.8 oz (92.3 kg) 216 lb 0.8 oz (98 kg) 217 lb 13 oz (98.8 kg)   HPI: 63 yoM admitted on 7/11 for enlarging adrenal mass and planned adrenalectomy.  Pharmacy is now consulted to dose TPN.  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  Planning for return to OR in 48-72h 7/23: per [facility] notes, continue abxs and TPN, s/p EXPLORATORY LAPAROTOMY, PLACEMENT OF ILEOSTOMY TUBES, PARTIAL CLOSURE OF FASCIA, WOUND VAC EXCHANGE 05/17/2017 and plan to take back to OR on 7/25 for closure  Insulin requirements past 24 hours: 6 units SSI (no hx DM)  Current Nutrition: NPO  IVF: D5 1/2NS with 40 mEq KCL at 40 ml/hr  Central access: PICC line ordered 7/19, placed on 7/20 TPN start date: 7/20  ASSESSMENT                                                                                                            Today, 05/19/17 - Glucose: within goal < 150 with minimal SSI correction - Electrolytes: WNL - Renal:  SCr wnl, stable. - LFTs: WNL, tbili WNL - TGs:  296 (7/18), 266 (7/20), 269 stable (7/23) - Prealbumin:  <5 (7/20), 6.1 (7/24) - Propofol has been stopped  NUTRITIONAL GOALS                                                                                             RD recs (7/20):  160 Protein, 2280-2510 Kcal Clinimix 5/15 at a max fluid volume of 3L would be  provide a rate of 125 ml/hr  + 20% fat emulsion 240 ml/day to provide: 153 g of protein and 2652 kCals per day meeting 100 % of protein and >100% of kCal needs BUT modified goal of 83 ml/min due to continued TPN shortages to provide 100g protein and 1912 kcal daily  Based on ASPEN guidelines, while the patient meets ICU status the initiation of lipids is being delayed for 7 days or until transition out of ICU. Planned start date for lipids is 05/21/17.  Goal will be to meet 100% of the patient's protein needs and approximately  80% of their caloric needs. Clinimix 5/15 @ 125 mL/hr to provide 153 grams protein (100 % of goal) and 2129 kcal (>90% of goal)    PLAN                                                                                                                         At 1800 today:  Continue Clinimix E 5/15 at 83 ml/hr. Will consider further increase in rate s/p surgery 7/25 to increase protein for further wound healing  Plan to advance as tolerated to the goal rate.  Hold lipids for the first 7 days - plan to begin on 7/27  TPN to contain standard multivitamins daily and trace elements only M/W/F.  Continue IVF at 40 ml/hr.  Continue CBGs and SSI per MD orders.  TPN lab panels on Mondays & Thursdays.  BMET, Mag, and Phos x 3 days   Adrian Saran, PharmD, BCPS Pager [phone] 05/19/2017 11:08 AM

## 2017-05-19 NOTE — Progress Notes (Signed)
S: pain worse, some cough overnight, sweats overnight O: 97.7 HR 102 138/86 RR 18 95% on 2L [place]  Gen: NAD Neuro: AOx4 R: coarse RLL Ab: soft, ATTP, vac in place  A/P s/p right adrenalectomy, WBC up from yesterday concern for pneumonia vs postoperative complication -Continue to monitor, if continues will get CT scan tomorrow -start empiric abx for HCAP

## 2017-05-19 NOTE — Progress Notes (Signed)
[facility] Note  ADMISSION DATE:  05/06/2017  CONSULTATION DATE:  05/13/17  REFERRING MD:  Kieth Brightly  CHIEF COMPLAINT:  Vent Management  Presenting HPI:  56 y.o. 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 On 7/18, he developed a leak therefore was taken back to OR for re-do of ex-lap and repair of leak.  He returned to the ICU on the vent and PCCM was consulted for vent management.  Subjective:  RN reports pt continues to have intermittent agitation despite dilaudid / versed gtt's.  Reportedly did not tolerate addition of precedex due to hypotension.  Tmax 99.4, VSS.  Remains on PEEP 5 / 40% FiO2.  Planned return to OR in am 7/25 for wash out and attempt at closure. 19L+ positive since admit I/O.  WBC trending down.     Vent Mode: PRVC FiO2 (%):  [40 %-70 %] 70 % Set Rate:  [16 bmp] 16 bmp Vt Set:  [580 mL] 580 mL PEEP:  [5 cmH20] 5 cmH20 Plateau Pressure:  [17 cmH20-22 cmH20] 21 cmH20  Temp:  [99 F (37.2 C)-99.5 F (37.5 C)] 99.4 F (37.4 C) (07/24 0830) Pulse Rate:  [65-105] 94 (07/24 0600) Resp:  [14-24] 17 (07/24 0600) BP: (81-143)/(46-114) 122/73 (07/24 0600) SpO2:  [94 %-99 %] 96 % (07/24 0600) Arterial Line BP: (95)/(55) 95/55 (07/23 1100) FiO2 (%):  [40 %-70 %] 70 % (07/24 5956)  General:  Ill appearing adult male in NAD on vent HEENT: MM pink/moist, ETT Neuro: sedated, opens eyes to voice, nods appropriately, MAE / generalized weakness  CV: s1s2 rrr, no m/r/g PULM: even/non-labored, lungs bilaterally coarse GI: midline foley catheter drains x2, R upper lateral abdominal incision with VAC in place, clean/dry/intact  Extremities: warm/dry, no edema  Skin: no rashes or lesions  CBC Latest Ref Rng & Units 05/19/2017 05/18/2017 05/17/2017  WBC 4.0 - 10.5 K/uL 20.2(H)  28.8(H) -  Hemoglobin 13.0 - 17.0 g/dL 8.2(L) 8.7(L) 8.1(L)  Hematocrit 39.0 - 52.0 % 23.8(L) 25.6(L) 23.0(L)  Platelets 150 - 400 K/uL 668(H) 568(H) -    BMP Latest Ref Rng & Units 05/19/2017 05/19/2017 05/18/2017  Glucose 65 - 99 mg/dL 120(H) 118(H) 150(H)  BUN 6 - 20 mg/dL _0 Creatinine 0.61 - 1.24 mg/dL 0.68 0.71 0.71  Sodium 135 - 145 mmol/L 143 142 140  Potassium 3.5 - 5.1 mmol/L 3.7 3.7 4.6  Chloride 101 - 111 mmol/L 107 106 106  CO2 22 - 32 mmol/L _1 Calcium 8.9 - 10.3 mg/dL 7.7(L) 7.7(L) 7.9(L)    Hepatic Function Latest Ref Rng & Units 05/19/2017 05/18/2017 05/16/2017  Total Protein 6.5 - 8.1 g/dL - 4.9(L) 4.3(L)  Albumin 3.5 - 5.0 g/dL 1.4(L) 1.3(L) 1.3(L)  AST 15 - 41 U/L - 39 43(H)  ALT 17 - 63 U/L - 22 26  Alk Phosphatase 38 - 126 U/L - 76 80  Total Bilirubin 0.3 - 1.2 mg/dL - 0.9 1.5(H)    IMAGING/STUDIES: CT ABD/PELVIS W/ CONTRAST 7/18: IMPRESSION: 1. Diffuse dilatation of the small greater the large bowel with fluid levels throughout the small and large bowel, most compatible with adynamic ileus in the postoperative state. No bowel wall thickening. No discrete small bowel caliber transition to suggest obstruction. 2. Pneumoperitoneum in the right upper quadrant is presumably due to recent surgery .  3. Small postoperative fluid collection at the right adrenalectomy site without wall thickening or internal gas. 4. Small dependent right pleural effusion. 5. Patchy bibasilar lung consolidation and ground-glass attenuation with some volume loss, concerning for multilobar pneumonia and/or aspiration with some atelectasis. PORT CXR 7/23:  Personally reviewed by me. Improving left upper lobe opacity. Enteric feeding tube coursing below diaphragm and seeming to occur towards the fundus of stomach. Endotracheal tube in good position above the carina. No new focal opacity appreciated. Persistent silhouetting left hemidiaphragm.  MICROBIOLOGY: MRSA PCR 7/11:   Negative Abdominal Wound Culture 7/18:  Klebsiella pneumoniae (resistant to Ampicillin) Blood Cultures x2 7/19 >>> Tracheal Aspirate Culture 7/22: multiple organisms present, none predominant  ANTIBIOTICS: Aztreonam 7/17 (x1 dose) Vancomycin 7/17 - 7/21 Cefeime 7/17 >>> Eraxis 7/19 >>> Flagyl 7/20 >>>  LINES/TUBES: OETT 07/18 >>>  RUE PICC 7/20 >>> RAD ART LINE 7/18 >>> Foley 7/28 >>> OGT 7/19 >>>  SIGNIFICANT EVENTS: 07/11 - Admit for laparoscopic converted to open right adrenal ectomy w/  Small bowel resection and reanastamosis. Wound vac placed. 07/18 - Found to have leak >> taken back to OR for re-do exploratory laparotomy. Returned to ICU on vent. 07/19 - NGT pulled out and almost self-extubated. Developed cuff leak >> tube exchanged. Back to OR emergently for intestinal leak/ischemic bowel. 07/22 - Back to OR for wash out >> left with open abdomen & wound vac in place. Transfused 1u PRBC. 07/23 - Intermittent agitation, sedation adjusted    ASSESSMENT/PLAN:  56 y.o. male with sepsis secondary to intra-abdominal infection. Patient with persistent hypoxic respiratory failure.  1. Sepsis: Secondary to intra-abdominal infection secondary to anastomotic leak resulting in peritonitis. Continue broad spectrum abx and antifungals as above.  Await blood cultures.  Trend PCT (negative).  ICU monitoring. 2. Acute hypoxic respiratory failure: continue PRVC support, wean PEEP / FiO2 for sats > 92%, not a weaning candidate until abdominal closure completed.  Continue duoneb Q6.  Lasix 20 mg IV x1.   3. Possible HCAP: abx as above, trend CXR 4. Essential hypertension: hx of pheochromocytoma s/p resection.  Monitor vitals in ICU.   5. Intestinal perforation: during adrenalectomy in setting of adhesions s/p resection & reanastomosis by CCS.  ABD remains open with VAC in place.  Post-op care per CCS.  Planned to return to OR 7/25 for washout and possible closure 6. Anemia: without active  bleeding, trend CBC, prior PRBC infusion, most recent 7/22.  Transfuse for Hgb <7%.   7. History of hypothyroidism:  TSH last assessed 7/19, no acute changes.  Follow up in 6 weeks.  8. Tachyphylaxis: on propofol / fentanyl infusion.  Transitioned to dilaudid, versed gtt's.  Did not tolerate precedex due to hypotension.   9. Hypokalemia: monitor, replace electrolytes as indicated  10. Hyperglycemia: monitor CBG Q4 with SSI.   11. Protein calorie malnutrition: TPN per pharmacy dosing 12. Tobacco use disorder: Nicoderm patch 21 mcg  Prophylaxis:  SCDs, Lovenox [place] q24hr, & Protonix IV q24hr.  Diet:  NPO. Currently on TPN. Code Status:  Full Code   Disposition:  Patient remains intubated & sedated pending closure of abdomen / ICU Family Update: Wife updated on bedside rounds per Dr. Ashok Cordia.    CC Time: 30 minutes   Noe Gens, NP-C [facility] Pgr: [phone] or if no answer [phone] 05/19/2017, 10:17 AM

## 2017-05-19 NOTE — Progress Notes (Signed)
Nutrition Follow-up  DOCUMENTATION CODES:   Not applicable  INTERVENTION:  - Continue TPN/TPN advancement per Pharmacy. - RD will continue to monitor for nutrition-related needs. - Obtain new weight today.    NUTRITION DIAGNOSIS:   Inadequate oral intake related to inability to eat as evidenced by NPO status. -ongoing  GOAL:   Patient will meet greater than or equal to 90% of their needs -unmet with  Current TPN regimen.   MONITOR:   Vent status, Weight trends, Labs, Skin, I & O's, Other (Comment) (TPN regimen)  ASSESSMENT:   56 y.o. 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 On 7/18, he developed a leak therefore was taken back to OR for re-do of ex-lap and repair of leak.  7/24 Pt remains intubated and with OGT in place. Pt had a BM via ileostomy this AM. No new weight since 7/21; requested RN obtain a new weight during this shift. Pt with triple lumen PICC and is receiving Clinimix E 5/15 @ 83 mL/hr which is providing 100 grams of protein and 1414 kcal; ILE start date: 7/26. Per Pharmacy note this AM, plan to continue this rate today and to increase rate following OR visit tomorrow. Per rounds this AM, Precedex led to hypotension yesterday and possible plan to re-try today.   Patient is currently intubated on ventilator support MV: 11.5 L/min Temp (24hrs), Avg:99.2 F (37.3 C), Min:99 F (37.2 C), Max:99.5 F (37.5 C) BP: 136/76 and MAP: 94  Medications reviewed; 10 mg rectal Dulcolax/day, sliding scale Novolog, 40 mg IV Protonix/day, 100 mg IV thiamine/day.  Labs reviewed; CBGs: 118 and 125 mg/dL, Ca: 7.7 mg/dL.  IVF: D5-1/2 NS-40 mEq KCl @ 40 mL/hr (163 kcal from dextrose).  Drips: Versed @ 6 mg/hr, Dilaudid @ 4 mg/hr     7/23 - Pt remains intubated with OGT in place - No output from  OGT.  - Pt returned to the OR yesterday for ex lap, placement of ileostomy tubes, partial closure of fascia, and wound vac exchange.  - Plan is for return to OR on 7/25 for full closure of abdominal fascia.  - Weight +0.8 kg from 7/18-7/21 and no new weight since that time.  - Continue to use weight of 89.4 kg in estimating needs.  - Pt with triple lumen PICC and is currently receiving Clinimix E 5/15 @ 60 mL/hr which is providing 72 grams of protein and 1022 kcal.  - Plan to increase to Clinimix E 5/15 @ 83 mL/hr tonight at 1800.  - This rate will provide 100 grams of protein and 1414 kcal.  - Per Pharmacy note, goal rate for TPN is Clinimix E 5/15 @ 115 mL/hr.  - This rate will provide 138 grams of protein (86% estimated protein need) and 1960 kcal (100% re-estimated minimum kcal need). - Start date for ILE: 7/26  Patient is currently intubated on ventilator support MV: 9.7 L/min Temp (24hrs), Avg:98.2 F (36.8 C), Min:97.8 F (36.6 C), Max:99 F (37.2 C) Propofol: none BP: 102/58 and MAP: 71 IVF: D5-1/2 NS-40 mEq KCl @ 40 mL/hr (163 kcal from dextrose).  Drips: Versed @ 6 mg/hr, Dilaudid @ 4 mg/hr, Precedex @ 0.5 mcg/kg/hr.    *SEE CHART FOR PREVIOUS RD NOTES    Diet Order:  Diet NPO time specified .TPN (CLINIMIX-E) Adult .TPN (CLINIMIX-E) Adult  Skin:  Wound (see comment) (Incisions to R flank and abdomen from 7/11 and 7/18)  Last BM:  7/24  Height:   Ht Readings from Last 1 Encounters:  05/14/17 5\' 10"  (1.778 m)    Weight:   Wt Readings from Last 1 Encounters:  05/16/17 217 lb 13 oz (98.8 kg)    Ideal Body Weight:  75.45 kg  BMI:  Body mass index is 31.25 kg/m.  Estimated Nutritional Needs:   Kcal:  2072-2280 [facility] + x1.1 stress factor)  Protein:  160 grams of protein (1.8 grams/kg)  Fluid:  2.4 L/day  EDUCATION NEEDS:   No education needs identified at this time    Jarome Matin, MS, RD, LDN, CNSC Inpatient Clinical Dietitian Pager #  [phone] After hours/weekend pager # [phone]

## 2017-05-19 NOTE — Progress Notes (Signed)
Progress Note: General Surgery Service   Assessment/Plan: Patient Active Problem List   Diagnosis Date Noted  . Hypokalemia 05/14/2017  . Anastomotic leak of intestine 05/14/2017  . Severe sepsis ([place]) 05/14/2017  . Wound dehiscence, surgical 05/14/2017  . Aspiration pneumonia ([place]) 05/14/2017  . Chronic narcotic use 05/10/2017  . Anxiety state 05/10/2017  . Intra-abdominal adhesions s/p SB resection 05/06/2017 05/09/2017  . 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 Right adrenal mass s/p adrenalectomy 05/06/2017 05/06/2017  . Throat symptom 03/18/2016  . Multinodular goiter 01/19/2016  . Hyperthyroidism 01/19/2016  . Essential hypertension   . Diarrhea 11/30/2014  . Anemia, iron deficiency 11/01/2014  . Leukocytosis 11/03/2013  . Tobacco abuse 07/26/2013  . COPD (chronic obstructive pulmonary disease) ([place]) 07/26/2013  . Left knee pain 07/26/2013  . Pain in joint, shoulder region 05/03/2013  . GERD 07/24/2008  . 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   s/p Procedure(s): EXPLORATORY LAPAROTOMY, PLACEMENT OF ILEOSTOMY TUBES, PARTIAL CLOSURE OF FASCIA, WOUND VAC EXCHANGE 05/17/2017 -plan return to OR -continue deep suctioning -continue abx -continue TPN    LOS: 13 days  Chief Complaint/Subjective: Bowel movement overnight night, minimal drainage from tubes  Objective: Vital signs in last 24 hours: Temp:  [99 F (37.2 C)-99.5 F (37.5 C)] 99.5 F (37.5 C) (07/24 0359) Pulse Rate:  [65-105] 94 (07/24 0600) Resp:  [14-24] 17 (07/24 0600) BP: (81-143)/(46-114) 122/73 (07/24 0600) SpO2:  [94 %-99 %] 96 % (07/24 0600) Arterial Line BP: (93-95)/(55-57) 95/55 (07/23 1100) FiO2 (%):  [40 %-70 %] 70 % (07/24 0833) Last BM Date: 05/11/17  Intake/Output from previous day: 07/23 0701 - 07/24 0700 In: 2557.5 [I.V.:2217.5; IV Piggyback:300] Out: 2875 [QJJHE:1740; Emesis/NG output:600; Drains:350] Intake/Output this shift: No intake/output data recorded.  Lungs:  coarse, improved from 7/23  Cardiovascular: RRR  Abd: soft, ATTP, drains with small amount output in proximal drain  Extremities: trace edema  Neuro: sedated, intubated  Lab Results: CBC   Recent Labs  05/18/17 0534 05/19/17 0507  WBC 28.8* 20.2*  HGB 8.7* 8.2*  HCT 25.6* 23.8*  PLT 568* 668*   BMET  Recent Labs  05/18/17 0534 05/19/17 0507  NA 140 143  142  K 4.6 3.7  3.7  CL 106 107  106  CO2 '27 29  29  ' GLUCOSE 150* 120*  118*  BUN '14 18  18  ' CREATININE 0.71 0.68  0.71  CALCIUM 7.9* 7.7*  7.7*   PT/INR No results for input(s): LABPROT, INR in the last 72 hours. ABG No results for input(s): PHART, HCO3 in the last 72 hours.  Invalid input(s): PCO2, PO2  Studies/Results:  Anti-infectives: Anti-infectives    Start     Dose/Rate Route Frequency Ordered Stop   05/15/17 1000  anidulafungin (ERAXIS) 100 mg in sodium chloride 0.9 % 100 mL IVPB  Status:  Discontinued     100 mg 78 mL/hr over 100 Minutes Intravenous Every 24 hours 05/14/17 0829 05/16/17 0901   05/15/17 1000  metroNIDAZOLE (FLAGYL) IVPB 500 mg     500 mg 100 mL/hr over 60 Minutes Intravenous Every 8 hours 05/15/17 0903     05/14/17 0900  anidulafungin (ERAXIS) 200 mg in sodium chloride 0.9 % 200 mL IVPB     200 mg 78 mL/hr over 200 Minutes Intravenous  Once 05/14/17 0829 05/14/17 1325   05/13/17 1927  vancomycin (VANCOCIN) 1-5 GM/200ML-% IVPB    Comments:  Ward, Christa   : cabinet override      05/13/17 1927  05/14/17 0744   05/12/17 1400  ceFEPIme (MAXIPIME) 1 g in dextrose 5 % 50 mL IVPB     1 g 100 mL/hr over 30 Minutes Intravenous Every 8 hours 05/12/17 0939     05/12/17 0900  vancomycin (VANCOCIN) IVPB 1000 mg/200 mL premix  Status:  Discontinued     1,000 mg 200 mL/hr over 60 Minutes Intravenous Every 12 hours 05/12/17 0750 05/16/17 0852   05/12/17 0830  aztreonam (AZACTAM) 2 GM IVPB     2 g 100 mL/hr over 30 Minutes Intravenous  Once 05/12/17 0800 05/12/17 0957   05/06/17  0916  vancomycin (VANCOCIN) IVPB 1000 mg/200 mL premix     1,000 mg 200 mL/hr over 60 Minutes Intravenous On call to O.R. 05/06/17 0916 05/06/17 1229      Medications: Scheduled Meds: . bisacodyl  10 mg Rectal Daily  . chlorhexidine gluconate (MEDLINE KIT)  15 mL Mouth Rinse BID  . Chlorhexidine Gluconate Cloth  6 each Topical Daily  . Chlorhexidine Gluconate Cloth  6 each Topical Q0600  . enoxaparin (LOVENOX) injection  40 mg Subcutaneous Q24H  . insulin aspart  0-15 Units Subcutaneous Q4H  . ipratropium-albuterol  3 mL Nebulization Q6H  . lip balm  1 application Topical BID  . mouth rinse  15 mL Mouth Rinse QID  . nicotine  21 mg Transdermal Daily  . pantoprazole (PROTONIX) IV  40 mg Intravenous Q24H  . thiamine injection  100 mg Intravenous Daily   Continuous Infusions: . Marland KitchenTPN (CLINIMIX-E) Adult 83 mL/hr at 05/19/17 0400  . sodium chloride    . ceFEPime (MAXIPIME) IV Stopped (05/19/17 0543)  . dexmedetomidine (PRECEDEX) IV infusion Stopped (05/18/17 1330)  . dextrose 5 % and 0.45 % NaCl with KCl 40 mEq/L 40 mL/hr at 05/19/17 0400  . HYDROmorphone 4 mg/hr (05/19/17 0400)  . metronidazole Stopped (05/19/17 1009)  . midazolam (VERSED) infusion 6 mg/hr (05/19/17 0722)  . propofol (DIPRIVAN) infusion Stopped (05/18/17 1045)   PRN Meds:.acetaminophen, acetaminophen, HYDROmorphone, labetalol, magic mouthwash, midazolam, ondansetron **OR** ondansetron (ZOFRAN) IV, sodium chloride flush  Mickeal Skinner, MD Pg# [phone] [facility]

## 2017-05-20 ENCOUNTER — Inpatient Hospital Stay (HOSPITAL_COMMUNITY): Payer: Medicare Other

## 2017-05-20 ENCOUNTER — Inpatient Hospital Stay (HOSPITAL_COMMUNITY): Payer: Medicare Other | Admitting: Anesthesiology

## 2017-05-20 ENCOUNTER — Encounter (HOSPITAL_COMMUNITY)
Admission: RE | Disposition: A | Discharge status: Home Health | Payer: Self-pay | Source: Ambulatory Visit | Attending: Surgery

## 2017-05-20 ENCOUNTER — Encounter (HOSPITAL_COMMUNITY): Payer: Self-pay | Admitting: General Surgery

## 2017-05-20 DIAGNOSIS — J9601 Acute respiratory failure with hypoxia: Secondary | ICD-10-CM

## 2017-05-20 DIAGNOSIS — M7989 Other specified soft tissue disorders: Secondary | ICD-10-CM

## 2017-05-20 DIAGNOSIS — K567 Ileus, unspecified: Secondary | ICD-10-CM

## 2017-05-20 HISTORY — PX: LAPAROTOMY: SHX154

## 2017-05-20 LAB — PHOSPHORUS: PHOSPHORUS: 4.2 mg/dL (ref 2.5–4.6)

## 2017-05-20 LAB — CBC WITH DIFFERENTIAL/PLATELET
BASOS ABS: 0 10*3/uL (ref 0.0–0.1)
Basophils Relative: 0 %
EOS ABS: 0.2 10*3/uL (ref 0.0–0.7)
Eosinophils Relative: 1 %
HCT: 24.3 % — ABNORMAL LOW (ref 39.0–52.0)
HEMOGLOBIN: 8.2 g/dL — AB (ref 13.0–17.0)
LYMPHS ABS: 2.8 10*3/uL (ref 0.7–4.0)
LYMPHS PCT: 12 %
MCH: 27.6 pg (ref 26.0–34.0)
MCHC: 33.7 g/dL (ref 30.0–36.0)
MCV: 81.8 fL (ref 78.0–100.0)
MONOS PCT: 8 %
Monocytes Absolute: 1.9 10*3/uL — ABNORMAL HIGH (ref 0.1–1.0)
NEUTROS ABS: 18.4 10*3/uL — AB (ref 1.7–7.7)
Neutrophils Relative %: 79 %
PLATELETS: 753 10*3/uL — AB (ref 150–400)
RBC: 2.97 MIL/uL — AB (ref 4.22–5.81)
RDW: 15.2 % (ref 11.5–15.5)
WBC: 23.3 10*3/uL — AB (ref 4.0–10.5)

## 2017-05-20 LAB — BASIC METABOLIC PANEL
Anion gap: 5 (ref 5–15)
BUN: 17 mg/dL (ref 6–20)
CALCIUM: 7.7 mg/dL — AB (ref 8.9–10.3)
CO2: 30 mmol/L (ref 22–32)
Chloride: 105 mmol/L (ref 101–111)
Creatinine, Ser: 0.68 mg/dL (ref 0.61–1.24)
GFR calc Af Amer: >60 mL/min (ref >60)
GLUCOSE: 121 mg/dL — AB (ref 65–99)
POTASSIUM: 4.2 mmol/L (ref 3.5–5.1)
SODIUM: 140 mmol/L (ref 135–145)

## 2017-05-20 LAB — GLUCOSE, CAPILLARY
GLUCOSE-CAPILLARY: 114 mg/dL — AB (ref 65–99)
GLUCOSE-CAPILLARY: 127 mg/dL — AB (ref 65–99)
GLUCOSE-CAPILLARY: 126 mg/dL — AB (ref 65–99)
Glucose-Capillary: 150 mg/dL — ABNORMAL HIGH (ref 65–99)
Glucose-Capillary: 154 mg/dL — ABNORMAL HIGH (ref 65–99)

## 2017-05-20 LAB — TROPONIN I: Troponin I: <0.03 ng/mL (ref <0.03)

## 2017-05-20 LAB — MAGNESIUM: MAGNESIUM: 1.7 mg/dL (ref 1.7–2.4)

## 2017-05-20 LAB — PROCALCITONIN: PROCALCITONIN: 4.56 ng/mL

## 2017-05-20 SURGERY — LAPAROTOMY, EXPLORATORY
Anesthesia: General | Site: Abdomen

## 2017-05-20 MED ORDER — KCL IN DEXTROSE-NACL 40-5-0.45 MEQ/L-%-% IV SOLN: INTRAVENOUS | Status: DC

## 2017-05-20 MED ORDER — FENTANYL CITRATE (PF) 100 MCG/2ML IJ SOLN
25.0000 ug | INTRAMUSCULAR | Status: DC | PRN
Start: 2017-05-20 — End: 2017-05-21

## 2017-05-20 MED ORDER — 0.9 % SODIUM CHLORIDE (POUR BTL) OPTIME
TOPICAL | Status: DC | PRN
  Administered 2017-05-20: 3000 mL

## 2017-05-20 MED ORDER — ROCURONIUM BROMIDE 50 MG/5ML IV SOSY
PREFILLED_SYRINGE | INTRAVENOUS | Status: DC | PRN
  Administered 2017-05-20: 50 mg via INTRAVENOUS
  Administered 2017-05-20: 10 mg via INTRAVENOUS
  Administered 2017-05-20: 30 mg via INTRAVENOUS
  Administered 2017-05-20: 10 mg via INTRAVENOUS

## 2017-05-20 MED ORDER — ROCURONIUM BROMIDE 50 MG/5ML IV SOSY
PREFILLED_SYRINGE | INTRAVENOUS | Status: AC
  Filled 2017-05-20: qty 5

## 2017-05-20 MED ORDER — TRACE MINERALS CR-CU-MN-SE-ZN 10-1000-500-60 MCG/ML IV SOLN
INTRAVENOUS | Status: AC
  Administered 2017-05-20: 17:00:00 via INTRAVENOUS
  Filled 2017-05-20: qty 3000

## 2017-05-20 MED ORDER — FUROSEMIDE 10 MG/ML IJ SOLN
20.0000 mg | Freq: Once | INTRAMUSCULAR | Status: AC
  Administered 2017-05-20: 20 mg via INTRAVENOUS
  Filled 2017-05-20: qty 2

## 2017-05-20 MED ORDER — INSULIN ASPART 100 UNIT/ML ~~LOC~~ SOLN
0.0000 [IU] | Freq: Four times a day (QID) | SUBCUTANEOUS | Status: DC
  Administered 2017-05-20: 2 [IU] via SUBCUTANEOUS
  Administered 2017-05-20: 3 [IU] via SUBCUTANEOUS
  Administered 2017-05-21 – 2017-05-23 (×8): 2 [IU] via SUBCUTANEOUS
  Administered 2017-05-23: 3 [IU] via SUBCUTANEOUS
  Administered 2017-05-23 (×2): 2 [IU] via SUBCUTANEOUS
  Administered 2017-05-24: 3 [IU] via SUBCUTANEOUS
  Administered 2017-05-24 – 2017-05-25 (×6): 2 [IU] via SUBCUTANEOUS
  Administered 2017-05-25: 3 [IU] via SUBCUTANEOUS
  Administered 2017-05-26: 2 [IU] via SUBCUTANEOUS
  Administered 2017-05-26: 3 [IU] via SUBCUTANEOUS
  Administered 2017-05-26 – 2017-05-28 (×5): 2 [IU] via SUBCUTANEOUS
  Administered 2017-05-28 (×2): 3 [IU] via SUBCUTANEOUS
  Administered 2017-05-28: 2 [IU] via SUBCUTANEOUS
  Administered 2017-05-29: 3 [IU] via SUBCUTANEOUS
  Administered 2017-05-29 – 2017-06-04 (×13): 2 [IU] via SUBCUTANEOUS
  Administered 2017-06-04: 3 [IU] via SUBCUTANEOUS
  Administered 2017-06-04 – 2017-06-10 (×23): 2 [IU] via SUBCUTANEOUS
  Administered 2017-06-11: 1 [IU] via SUBCUTANEOUS
  Administered 2017-06-12 – 2017-06-18 (×11): 2 [IU] via SUBCUTANEOUS
  Administered 2017-06-19: 3 [IU] via SUBCUTANEOUS
  Administered 2017-06-19 – 2017-06-22 (×2): 2 [IU] via SUBCUTANEOUS

## 2017-05-20 MED ORDER — FENTANYL CITRATE (PF) 100 MCG/2ML IJ SOLN: 25.0000 ug | INTRAMUSCULAR | Status: DC | PRN

## 2017-05-20 MED ORDER — PROPOFOL 10 MG/ML IV BOLUS
INTRAVENOUS | Status: AC
  Filled 2017-05-20: qty 20

## 2017-05-20 MED ORDER — PROPOFOL 10 MG/ML IV BOLUS
INTRAVENOUS | Status: DC | PRN
  Administered 2017-05-20: 50 mg via INTRAVENOUS

## 2017-05-20 MED ORDER — ONDANSETRON HCL 4 MG/2ML IJ SOLN: 4.0000 mg | Freq: Once | INTRAMUSCULAR | Status: DC | PRN

## 2017-05-20 MED ORDER — LACTATED RINGERS IV SOLN
INTRAVENOUS | Status: DC | PRN
  Administered 2017-05-20: 11:00:00 via INTRAVENOUS

## 2017-05-20 MED ORDER — MEPERIDINE HCL 25 MG/ML IJ SOLN: 6.2500 mg | INTRAMUSCULAR | Status: DC | PRN

## 2017-05-20 MED ORDER — ONDANSETRON HCL 4 MG/2ML IJ SOLN
INTRAMUSCULAR | Status: DC | PRN
  Administered 2017-05-20: 4 mg via INTRAVENOUS

## 2017-05-20 MED ORDER — MAGNESIUM SULFATE 2 GM/50ML IV SOLN
2.0000 g | Freq: Once | INTRAVENOUS | Status: AC
  Administered 2017-05-20: 2 g via INTRAVENOUS
  Filled 2017-05-20: qty 50

## 2017-05-20 MED ORDER — MEPERIDINE HCL 25 MG/ML IJ SOLN
6.2500 mg | INTRAMUSCULAR | Status: DC | PRN
Start: 2017-05-20 — End: 2017-05-21

## 2017-05-20 SURGICAL SUPPLY — 46 items
CHLORAPREP W/TINT 26ML (MISCELLANEOUS) ×2 IMPLANT
COVER MAYO STAND STRL (DRAPES) ×2 IMPLANT
COVER SURGICAL LIGHT HANDLE (MISCELLANEOUS) ×2 IMPLANT
DRAIN CHANNEL 19F RND (DRAIN) ×2 IMPLANT
DRAPE LAPAROSCOPIC ABDOMINAL (DRAPES) ×2 IMPLANT
DRAPE UTILITY XL STRL (DRAPES) ×2 IMPLANT
DRAPE WARM FLUID 44X44 (DRAPE) ×2 IMPLANT
DRSG OPSITE POSTOP 4X10 (GAUZE/BANDAGES/DRESSINGS) IMPLANT
DRSG OPSITE POSTOP 4X6 (GAUZE/BANDAGES/DRESSINGS) IMPLANT
DRSG OPSITE POSTOP 4X8 (GAUZE/BANDAGES/DRESSINGS) IMPLANT
DRSG VAC ATS LRG SENSATRAC (GAUZE/BANDAGES/DRESSINGS) ×2 IMPLANT
ELECT BLADE 6.5 EXT (BLADE) IMPLANT
ELECT REM PT RETURN 15FT ADLT (MISCELLANEOUS) ×2 IMPLANT
EVACUATOR DRAINAGE 10X20 100CC (DRAIN) ×1 IMPLANT
EVACUATOR SILICONE 100CC (DRAIN) ×1
GLOVE BIOGEL PI IND STRL 7.0 (GLOVE) ×1 IMPLANT
GLOVE BIOGEL PI INDICATOR 7.0 (GLOVE) ×1
GLOVE SURG SS PI 7.0 STRL IVOR (GLOVE) ×2 IMPLANT
GOWN STRL REUS W/TWL LRG LVL3 (GOWN DISPOSABLE) ×2 IMPLANT
GOWN STRL REUS W/TWL XL LVL3 (GOWN DISPOSABLE) ×2 IMPLANT
HANDLE SUCTION POOLE (INSTRUMENTS) ×1 IMPLANT
KIT BASIN OR (CUSTOM PROCEDURE TRAY) ×2 IMPLANT
MARKER SKIN DUAL TIP RULER LAB (MISCELLANEOUS) ×2 IMPLANT
MESH PHASIX ST 25X30 (Mesh General) ×2 IMPLANT
PACK GENERAL/GYN (CUSTOM PROCEDURE TRAY) ×2 IMPLANT
RELOAD PROXIMATE 100 BLUE (MISCELLANEOUS) IMPLANT
RELOAD PROXIMATE 100MM BLUE (MISCELLANEOUS)
SEALER TISSUE X1 CVD JAW (INSTRUMENTS) IMPLANT
SPONGE DRAIN TRACH 4X4 STRL 2S (GAUZE/BANDAGES/DRESSINGS) ×6 IMPLANT
SPONGE LAP 18X18 X RAY DECT (DISPOSABLE) IMPLANT
STAPLER PROXIMATE 100MM BLUE (MISCELLANEOUS) IMPLANT
STAPLER VISISTAT 35W (STAPLE) ×2 IMPLANT
SUCTION POOLE HANDLE (INSTRUMENTS) ×2
SUT MNCRL AB 4-0 PS2 18 (SUTURE) IMPLANT
SUT PDS AB 0 CT1 36 (SUTURE) ×8 IMPLANT
SUT PDS AB 1 CTX 36 (SUTURE) ×4 IMPLANT
SUT PROLENE 0 CT 2 (SUTURE) ×2 IMPLANT
SUT SILK 2 0 (SUTURE) ×1
SUT SILK 2 0 SH CR/8 (SUTURE) ×16 IMPLANT
SUT SILK 2-0 18XBRD TIE 12 (SUTURE) ×1 IMPLANT
SUT SILK 3 0 (SUTURE) ×1
SUT SILK 3 0 SH CR/8 (SUTURE) ×2 IMPLANT
SUT SILK 3-0 18XBRD TIE 12 (SUTURE) ×1 IMPLANT
TOWEL OR 17X26 10 PK STRL BLUE (TOWEL DISPOSABLE) ×2 IMPLANT
TOWEL OR NON WOVEN STRL DISP B (DISPOSABLE) ×2 IMPLANT
YANKAUER SUCT BULB TIP NO VENT (SUCTIONS) ×2 IMPLANT

## 2017-05-20 NOTE — Op Note (Signed)
Preoperative diagnosis: open abdomen, recent intestinal leak  Postoperative diagnosis: same   Procedure: placement of mesh for abdominal closure, placement of wound vac of 100cm^2  Surgeon: Gurney Maxin, M.D.  Asst: Nedra Hai  Anesthesia: general  Indications for procedure: [name] [name] is a 56 y.o. year old male who underwent adrenalectomy with complication of intestinal leak presents for reexploration.  Description of procedure: The patient was brought into the operative suite. Anesthesia was administered with General endotracheal anesthesia. WHO checklist was applied. The patient was then placed in supine position. The area was prepped and draped in the usual sterile fashion.  The vac was taken down and the intestinal ends were evaluated. The abdomen was frozen and manipulation was not safe. The abdomen was irrigated with saline. I spoke with the wife via the phone to communicate the findings and we agreed to proceed with the plan for temporary mesh placement to allow closure.  Therefore, A 25 x 35 phasix mesh was sutured to the fascia using a 0 (inferiorly) and #1 (superiorly) PDS in running fascia. The larger needle was used on the superior aspect to along suturing over the rib in a few positions. Next, 0 PDS was used to suture the fascia to appose itself in interrupted fashion. A large black vac was replaced. The patient was then transferred back to the ICU in critical condition.  Findings: ileal tubes in place with no leakage in the area. Severe inflammatory and edematous changes to intestine  Specimen: none  Implant: 25 x 35cm Phasix dual layered mesh, 19Fr blake drain in RLQ with suction portion along the blind ends of intestine, black vac sponge  Blood loss: 47ml  Local anesthesia: none  Complications: none  Gurney Maxin, M.D. General, Bariatric, & Minimally Invasive Surgery [facility]

## 2017-05-20 NOTE — Progress Notes (Signed)
Patient upon arrival from surgery was tachycardic in the 140s and oxygen saturation had dropped down to 80%, RT at bedside, [facility] called and MD as well as NP paged. STAT chest xray  Ordered and a one time troponin ordered, RN to collect. Patients O2 stable at 100% on 100% FiO2, PEEP of 10 as ordered by NP> RN to continue to monitor.

## 2017-05-20 NOTE — Progress Notes (Signed)
[facility] Note  ADMISSION DATE:  05/06/2017  CONSULTATION DATE:  05/13/17  REFERRING MD:  Kieth Brightly  CHIEF COMPLAINT:  Vent Management  Presenting HPI:  56 y.o. 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 On 7/18, he developed a leak therefore was taken back to OR for re-do of ex-lap and repair of leak.  He returned to the ICU on the vent and PCCM was consulted for vent management.  Subjective:  No acute events overnight.  Tmax 100.9.  WBC increased to 23.3 (20.2).  3025 UOP but only net negative -634 in last 24 hours.  IVF's total running at ~ 14m/hr + abx.  RN reports pt pending OR at 1000.  Wife has concerns about which part of surgery is priority and RUE swelling.    Vent Mode: PRVC FiO2 (%):  [35 %-40 %] 35 % Set Rate:  [16 bmp] 16 bmp Vt Set:  [580 mL] 580 mL PEEP:  [5 cmH20] 5 cmH20 Plateau Pressure:  [19 cmH20-21 cmH20] 19 cmH20  Temp:  [98.8 F (37.1 C)-100.9 F (38.3 C)] 100.9 F (38.3 C) (07/25 0400) Pulse Rate:  [97-114] 97 (07/25 0700) Resp:  [16-30] 21 (07/25 0700) BP: (105-145)/(59-84) 121/70 (07/25 0700) SpO2:  [92 %-99 %] 95 % (07/25 0700) FiO2 (%):  [35 %-40 %] 35 % (07/25 0345)  General: acutely ill adult [name] [name] in NAD  HEENT: MM pink/moist, ETT Neuro: sedate, opens eyes to voice, nods / falls back to sleep CV: s1s2 rrr, no m/r/g PULM: even/non-labored, lungs bilaterally coarse GI: midline large catheter x2, RUQ VAC c/d/i Extremities: warm/dry, generalized edema, RUE swelling Skin: no rashes or lesions   CBC Latest Ref Rng & Units 05/20/2017 05/19/2017 05/18/2017  WBC 4.0 - 10.5 K/uL 23.3(H) 20.2(H) 28.8(H)  Hemoglobin 13.0 - 17.0 g/dL 8.2(L) 8.2(L) 8.7(L)  Hematocrit 39.0 - 52.0 % 24.3(L) 23.8(L) 25.6(L)  Platelets 150 - 400 K/uL 753(H) 668(H) 568(H)     BMP Latest Ref Rng & Units 05/20/2017 05/19/2017 05/19/2017  Glucose 65 - 99 mg/dL 121(H) 120(H) 118(H)  BUN 6 - 20 mg/dL '17 18 18  ' Creatinine 0.[name] - 1.24 mg/dL 0.68 0.68 0.71  Sodium 135 - 145 mmol/L 140 143 142  Potassium 3.5 - 5.1 mmol/L 4.2 3.7 3.7  Chloride 101 - 111 mmol/L 105 107 106  CO2 22 - 32 mmol/L '30 29 29  ' Calcium 8.9 - 10.3 mg/dL 7.7(L) 7.7(L) 7.7(L)    Hepatic Function Latest Ref Rng & Units 05/19/2017 05/18/2017 05/16/2017  Total Protein 6.5 - 8.1 g/dL - 4.9(L) 4.3(L)  Albumin 3.5 - 5.0 g/dL 1.4(L) 1.3(L) 1.3(L)  AST 15 - 41 U/L - 39 43(H)  ALT 17 - 63 U/L - 22 26  Alk Phosphatase 38 - 126 U/L - 76 80  Total Bilirubin 0.3 - 1.2 mg/dL - 0.9 1.5(H)    IMAGING/STUDIES: CT ABD/PELVIS W/ CONTRAST 7/18: IMPRESSION: 1. Diffuse dilatation of the small greater the large bowel with fluid levels throughout the small and large bowel, most compatible with adynamic ileus in the postoperative state. No bowel wall thickening. No discrete small bowel caliber transition to suggest obstruction. 2. Pneumoperitoneum in the right upper quadrant is presumably due to recent surgery . 3. Small postoperative fluid collection at the right adrenalectomy site without wall thickening or internal gas. 4. Small dependent right pleural  effusion. 5. Patchy bibasilar lung consolidation and ground-glass attenuation with some volume loss, concerning for multilobar pneumonia and/or aspiration with some atelectasis. PORT CXR 7/23: Improving left upper lobe opacity. Enteric feeding tube coursing below diaphragm and seeming to occur towards the fundus of stomach. Endotracheal tube in good position above the carina. No new focal opacity appreciated. Persistent silhouetting left hemidiaphragm. PORT CXR 7/25: small bilateral pleural effusions VENOUS DUPLEX 7/25 >>  MICROBIOLOGY: MRSA PCR 7/11:  Negative Abdominal Wound Culture 7/18:  Klebsiella pneumoniae (resistant to Ampicillin) Blood Cultures x2 7/19:  negative  Tracheal Aspirate Culture 7/22: multiple organisms present, none predominant  ANTIBIOTICS: Aztreonam 7/17 (x1 dose) Vancomycin 7/17 - 7/21 Cefeime 7/17 >>> Eraxis 7/19 >>> Flagyl 7/20 >>>  LINES/TUBES: OETT 07/18 >>>  RUE PICC 7/20 >>> RAD ART LINE 7/18 >>> 7/23 Foley 7/28 >>> OGT 7/19 >>>  SIGNIFICANT EVENTS: 07/11 - Admit for laparoscopic converted to open right adrenal ectomy w/  Small bowel resection and reanastamosis. Wound vac placed. 07/18 - Found to have leak >> taken back to OR for re-do exploratory laparotomy. Returned to ICU on vent. 07/19 - NGT pulled out and almost self-extubated. Developed cuff leak >> tube exchanged. Back to OR emergently for intestinal leak/ischemic bowel. 07/22 - Back to OR for wash out >> left with open abdomen & wound vac in place. Transfused 1u PRBC. 07/23 - Intermittent agitation, sedation adjusted    ASSESSMENT/PLAN:  56 y.o. [name] [name] admitted with adrenal mass positive for metanephrine's s/p right adrenalectomy complicated by small bowel injury in the setting of adhesions with sepsis secondary to intra-abdominal infection. Patient with persistent hypoxic respiratory failure.  1. Sepsis: in the setting of intra-abdominal infection with anastomotic leak resulting in peritonitis.  Continue abx & antifungals as above.  Cultures are negative.  PCT rising and mild fever overnight.  Continue close monitoring.  2. Acute hypoxic respiratory failure: in setting of sepsis, post-op adrenalectomy.  Continue PRVC 8cc/kg, wean PEEP/FiO2 for sats > 92%, currently not a weaning candidate due to open abdomen.  Continue duoneb Q6.  Lasix 40m IV x1.   3. Possible HCAP: abx as above, trend CXR  4. Essential hypertension: hx of pheochromocytoma s/p resection.  Monitor vitals in ICU.   5. 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  Recommendations per CCS.  VAC care per protocol.  Pending return to OR 7/25 for wash  out and possible closure. May require two stage process for closure 6. Anemia: without active bleeding, trend CBC, PRBC for Hgb <7%.   7. RUE Swelling:  Rule out DVT, assess venous duplex 8. History of hypothyroidism:  Repeat TSH in 6 weeks (~around 9/1)  9. Tachyphylaxis: noted while on propofol / fentanyl infusion.  Continue dilaudid / versed gtts.  Did not tolerate precedex due to hypotension.   10. Hypokalemia: monitor BMP, replace electrolytes as indicated   11. Hyperglycemia: CBG Q4 with SSI    12. Protein calorie malnutrition: TPN per pharmacy 13. Tobacco use disorder: continue nicoderm patch  Prophylaxis:  SCD's, Lovenox, Protonix    Diet:  NPO. Continue TPN. Code Status:  Full Code   Disposition:  ICU.  Remains intubated / sedated on vent.  Pending abdominal closure.  Family Update: Wife updated in full on NP rounding.     CC Time: 437minutes   BNoe Gens NP-C [facility] Pgr: [phone] or if no answer [phone]/25/2018, 7:24 AM

## 2017-05-20 NOTE — Progress Notes (Signed)
Patients wife, unsure about going along with the plan for surgery, she is wanting to delay surgery for indefinite amount of time. OR desk called and Surgeon paged to come and clarify procedure with patients wife.

## 2017-05-20 NOTE — Progress Notes (Signed)
[facility] NOTE   Pharmacy Consult for TPN Indication: prolonged ileus, small bowel leak  Patient Measurements: Body mass index is 31.25 kg/m. Filed Weights   05/06/17 1715 05/13/17 2100 05/16/17 0500  Weight: 203 lb 7.8 oz (92.3 kg) 216 lb 0.8 oz (98 kg) 217 lb 13 oz (98.8 kg)   HPI: 27 yoM admitted on 7/11 for enlarging adrenal mass and planned adrenalectomy.  Pharmacy is now consulted to dose TPN.  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  Planning for return to OR in 48-72h 7/22 OR:  wash out, tube ileostomy, left with open abdomen & wound vac in place.  Plan for OR on 7/25 for closure 7/25 OR: mesh placed for abdominal closure, wound vac  Insulin requirements past 24 hours: 2 units SSI (no hx DM)  Current Nutrition: NPO  IVF: D5 1/2NS with 40 mEq KCL at 40 ml/hr  Central access: PICC line ordered 7/19, placed on 7/20 TPN start date: 7/20  ASSESSMENT                                                                                                           Today, 05/20/17 - Glucose: within goal < 150 with minimal SSI correction - Electrolytes: WNL - Renal:  SCr wnl, stable. - LFTs: WNL, Tbili WNL - TGs:  296 (7/18), 266 (7/20), 269 (7/23).  Propofol d/c 7/24. - Prealbumin:  <5 (7/20), 6.1 (7/24)  NUTRITIONAL GOALS                                                                                             RD recs (7/24):  160 Protein,  7622-6333 Kcal  Clinimix 5/15 at a max fluid volume of 3L at 125 ml/hr  + 20% fat emulsion 240 ml/day to provide: 150 g of protein  and 2610 kCals per day meeting 94% of protein and >100% of kCal needs. - Glucose infusion rate will be 3.2 mg/kg/min (Maximum 5 mg/kg/min) - Using Lipids only 3 times/week on MWF would provide: 150 g of protein and an average of 2336 kCals per day meeting 94% of protein and 100 % of kCal needs.  Based on ASPEN guidelines, while the patient meets ICU status the initiation of lipids is being delayed for 7 days or until transition out of ICU. Planned start date for lipids is  05/22/17.  Goal will be to meet 100% of the patient's protein needs and approximately 80% of their caloric needs. Clinimix 5/15 @ 125 mL/hr to provide 150 grams protein (94% of goal) and 2130 kcal (97% of goal)    PLAN                                                                                                                         Magnesium 2g bolus per PCCM  At 1800 today:  Titrate to Clinimix E 5/15 at 125 ml/hr.   Hold lipids for the first 7 days - plan to begin on 7/27  TPN to contain standard multivitamins daily and trace elements only M/W/F.  Decrease IVF to [place] to CBGs and SSI q6h instead of q4h.  TPN lab panels on Mondays & Thursdays.  BMET, [place], Phos 7/27-7/29  Gretta Arab PharmD, BCPS Pager [phone] 05/20/2017 9:26 AM

## 2017-05-20 NOTE — Progress Notes (Signed)
Nutrition Follow-up  DOCUMENTATION CODES:   Not applicable  INTERVENTION:  - Continue TPN/TPN advancement per Pharmacy. - RD will continue to monitor for nutrition-related needs following return to OR this AM.   NUTRITION DIAGNOSIS:   Inadequate oral intake related to inability to eat as evidenced by NPO status. -ongoing  GOAL:   Patient will meet greater than or equal to 90% of their needs -unmet with current TPN rate.   MONITOR:   Vent status, Weight trends, Labs, Skin, I & O's, Other (Comment) (TPN regimen)  ASSESSMENT:   56 y.o. 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 On 7/18, he developed a leak therefore was taken back to OR for re-do of ex-lap and repair of leak.  7/25 Pt remains intubated and with OGT in place. Spoke with RN this AM who also had pt during day shift yesterday. She reports new weight obtained yesterday afternoon and was 99.9 kg; if time permits she may try to re-weigh pt today and make sure that all equipment and extra pillows are off of the bed prior to weighing. Continue to use weight of 89.4 kg in estimating needs.   Plan was for return to [place] today; Surgeon talked with pt's wife earlier this AM d/t concerns she had and her wanting to delay surgery. Will monitor for plan. RN reports that if pt goes to OR, possible plan removal of OGT, placement of NGT, and extubation following surgery.   Pt with triple lumen PICC and TPN regimen today remains the same as yesterday. Will monitor for plan following surgery (if occurs) and RD will follow-up tomorrow.   Patient is currently intubated on ventilator support MV: 11.2 L/min Temp (24hrs), Avg:100 F (37.8 C), Min:98.8 F (37.1 C), Max:100.9 F (38.3 C) BP: 117/70 and MAP: 82  Medications reviewed; 20 mg IV Lasix x1 dose today,  sliding scale Novolog, 2 g IV Mg sulfate x1 run today, 40 mg IV Protonix/day, 100 mg IV thiamine/day Labs reviewed; CBGs: 114 and 127 mg/dL, Ca: 7.7 mg/dL.  IVF: D5-1/2 NS-40 mEq KCl @ 40 mL/hr (163 kcal from dextrose) Drip: Versed @ 6 mg/hr.     7/24 - Pt had a BM via ileostomy this AM.  - No new weight since 7/21; requested RN obtain a new weight during this shift.  - Pt with triple lumen PICC and is receiving Clinimix E 5/15 @ 83 mL/hr which is providing 100 grams of protein and 1414 kcal; ILE start date: 7/26.  - Per Pharmacy note this AM, plan to continue this rate today and to increase rate following OR visit tomorrow.  - Per rounds this AM, Precedex led to hypotension yesterday and possible plan to re-try today.   Patient is currently intubated on ventilator support MV: 11.5 L/min Temp (24hrs), Avg:99.2 F (37.3 C), Min:99 F (37.2 C), Max:99.5 F (37.5 C) BP: 136/76 and MAP: 94 IVF: D5-1/2 NS-40 mEq KCl @ 40 mL/hr (163 kcal from dextrose).  Drips: Versed @ 6 mg/hr, Dilaudid @ 4 mg/hr     7/23 - Pt remains intubated with OGT in place - No output from OGT.  - Pt returned to the OR yesterday for ex lap, placement of ileostomy tubes, partial closure of fascia, and wound vac exchange.  - Plan is for return to OR on 7/25 for full closure of abdominal  fascia.  - Weight +0.8 kg from 7/18-7/21 and no new weight since that time.  - Continue to use weight of 89.4 kg in estimating needs.  - Pt with triple lumen PICC and iscurrently receiving Clinimix E 5/15 @ 60 mL/hr which is providing 72 grams of protein and 1022 kcal.  - Plan to increase to Clinimix E 5/15 @ 83 mL/hr tonight at 1800.  - This rate will provide 100 grams of protein and 1414 kcal.  - Per Pharmacy note, goal rate for TPN is Clinimix E 5/15 @ 115 mL/hr.  - This rate will provide 138 grams of protein (86% estimated protein need) and 1960 kcal (100% re-estimated minimum kcal need). - Start date for ILE:  7/26  Patient is currently intubated on ventilator support MV: 9.7L/min Temp (24hrs), Avg:98.2 F (36.8 C), Min:97.8 F (36.6 C), Max:99 F (37.2 C) Propofol: none BP: 102/58 and MAP: 71 IVF:D5-1/2 NS-40 mEq KCl @ 40 mL/hr (163 kcal from dextrose).  Drips: Versed @ 6 mg/hr, Dilaudid @ 4 mg/hr, Precedex @ 0.5 mcg/kg/hr.    *SEE CHART FOR PREVIOUS RD NOTES   Diet Order:  Diet NPO time specified .TPN (CLINIMIX-E) Adult  Skin:  Wound (see comment) (Incisions to R flank and abdomen from 7/11 and 7/18)  abdominal also from 7/19 and 7/22  Last BM:  7/24  Height:   Ht Readings from Last 1 Encounters:  05/14/17 5\' 10"  (1.778 m)    Weight:   Wt Readings from Last 1 Encounters:  05/16/17 217 lb 13 oz (98.8 kg)    Ideal Body Weight:  75.45 kg  BMI:  Body mass index is 31.25 kg/m.  Estimated Nutritional Needs:   Kcal:  1638-4536 [facility] + x1.1 stress factor)  Protein:  160 grams of protein (1.8 grams/kg)  Fluid:  2.4 L/day  EDUCATION NEEDS:   No education needs identified at this time    Jarome Matin, MS, RD, LDN, CNSC Inpatient Clinical Dietitian Pager # [phone] After hours/weekend pager # [phone]

## 2017-05-20 NOTE — Anesthesia Preprocedure Evaluation (Signed)
Anesthesia Evaluation  Patient identified by MRN, date of birth, ID band Patient awake    Reviewed: Allergy & Precautions, H&P , NPO status , Patient's Chart, lab work & pertinent test results  History of Anesthesia Complications Negative for: history of anesthetic complications  Airway Mallampati: Intubated   Neck ROM: full    Dental  (+) Teeth Intact   Pulmonary pneumonia, COPD, Current Smoker,  Has cough , on antiibiotics   breath sounds clear to auscultation       Cardiovascular hypertension,  Rhythm:Regular Rate:Normal     Neuro/Psych  Headaches,    GI/Hepatic GERD  ,  Endo/Other    Renal/GU      Musculoskeletal  (+) Arthritis ,   Abdominal   Peds  Hematology  (+) anemia ,   Anesthesia Other Findings 7/14  ekg  Sinus tachycardia Left atrial enlargement Nonspecific ST abnormality  Reproductive/Obstetrics                             Lab Results  Component Value Date   WBC 23.3 (H) 05/20/2017   HGB 8.2 (L) 05/20/2017   HCT 24.3 (L) 05/20/2017   MCV 81.8 05/20/2017   PLT 753 (H) 05/20/2017   Lab Results  Component Value Date   CREATININE 0.68 05/20/2017   BUN 17 05/20/2017   NA 140 05/20/2017   K 4.2 05/20/2017   CL 105 05/20/2017   CO2 30 05/20/2017    Anesthesia Physical  Anesthesia Plan  ASA: IV and emergent  Anesthesia Plan: General   Post-op Pain Management:    Induction: Intravenous  PONV Risk Score and Plan: 3 and Ondansetron, Dexamethasone, Propofol, Midazolam and Treatment may vary due to age or medical condition  Airway Management Planned: Oral ETT  Additional Equipment: Arterial line  Intra-op Plan:   Post-operative Plan: Post-operative intubation/ventilation  Informed Consent: I have reviewed the patients History and Physical, chart, labs and discussed the procedure including the risks, benefits and alternatives for the proposed anesthesia with  the patient or authorized representative who has indicated his/her understanding and acceptance.   Dental advisory given  Plan Discussed with: CRNA and Surgeon  Anesthesia Plan Comments:         Anesthesia Quick Evaluation

## 2017-05-20 NOTE — Transfer of Care (Signed)
Immediate Anesthesia Transfer of Care Note  Patient: [name] [name]  Procedure(s) Performed: Procedure(s): ABDOMINAL WOUND [place] OUT, WOUND CLOSURE AND WOUND VAC PLACEMENT (N/A)  Patient Location: ICU  Anesthesia Type:General  Level of Consciousness: sedated and Patient remains intubated per anesthesia plan  Airway & Oxygen Therapy: Patient remains intubated per anesthesia plan  Post-op Assessment: Report given to RN and Post -op Vital signs reviewed and stable  Post vital signs: Reviewed and stable  Last Vitals:  Vitals:   05/20/17 0900 05/20/17 1000  BP: 117/70 113/67  Pulse: (!) 101 (!) 101  Resp: 16 17  Temp:      Last Pain:  Vitals:   05/20/17 0800  TempSrc: Oral  PainSc:       Patients Stated Pain Goal: 2 (09/32/67 1245)  Complications: No apparent anesthesia complications

## 2017-05-20 NOTE — Progress Notes (Signed)
Pre Procedure note for inpatients:   [name] [name] has been scheduled for Procedure(s): EXPLORATORY LAPAROTOMY, PLACEMENT OF ILEOSTOMY TUBES, PARTIAL CLOSURE OF FASCIA, WOUND VAC EXCHANGE (N/A) today. The various methods of treatment have been discussed with the patient. After consideration of the risks, benefits and treatment options the patient has consented to the planned procedure.   The patient has been seen and labs reviewed. There are no changes in the patient's condition to prevent proceeding with the planned procedure today.  Recent labs:  Lab Results  Component Value Date   WBC 23.3 (H) 05/20/2017   HGB 8.2 (L) 05/20/2017   HCT 24.3 (L) 05/20/2017   PLT 753 (H) 05/20/2017   GLUCOSE 121 (H) 05/20/2017   CHOL 183 03/06/2015   TRIG 269 (H) 05/18/2017   HDL 34 (L) 03/06/2015   LDLCALC 73 03/06/2015   ALT 22 05/18/2017   AST 39 05/18/2017   NA 140 05/20/2017   K 4.2 05/20/2017   CL 105 05/20/2017   CREATININE 0.68 05/20/2017   BUN 17 05/20/2017   CO2 30 05/20/2017   TSH 0.552 05/14/2017   INR 1.00 05/04/2017    Mickeal Skinner, MD 05/20/2017 10:38 AM

## 2017-05-20 NOTE — Progress Notes (Addendum)
**  Preliminary report by tech**  Bilateral upper extremity venous duplex complete. There is no evidence of deep vein thrombosis involving the right and left upper extremities. There is evidence of age indeterminate superficial vein thrombosis involving the cephalic vein of the left upper extremity. There is no evidence of superficial vein thrombosis involving the right upper extremity. Results were given to the patient's nurse, Zoe.  05/20/17 3:39 PM [name] [name] RVT

## 2017-05-20 NOTE — Progress Notes (Signed)
Assisted with PT transfer to [place] from [facility] ICU 1239- uneventful. PT was transferred on 100% Fi02 via Ambu bag.

## 2017-05-20 NOTE — Anesthesia Postprocedure Evaluation (Signed)
Anesthesia Post Note  Patient: [name] [name]  Procedure(s) Performed: Procedure(s) (LRB): ABDOMINAL WOUND [place] OUT, WOUND CLOSURE AND WOUND VAC PLACEMENT (N/A)     Patient location during evaluation: SICU Anesthesia Type: General Level of consciousness: sedated Pain management: pain level controlled Vital Signs Assessment: post-procedure vital signs reviewed and stable Respiratory status: patient remains intubated per anesthesia plan Cardiovascular status: blood pressure returned to baseline and stable Postop Assessment: no signs of nausea or vomiting Anesthetic complications: no    Last Vitals:  Vitals:   05/20/17 0900 05/20/17 1000  BP: 117/70 113/67  Pulse: (!) 101 (!) 101  Resp: 16 17  Temp:      Last Pain:  Vitals:   05/20/17 0800  TempSrc: Oral  PainSc:                  [name] [name]

## 2017-05-21 ENCOUNTER — Encounter (HOSPITAL_COMMUNITY): Payer: Self-pay | Admitting: General Surgery

## 2017-05-21 ENCOUNTER — Inpatient Hospital Stay (HOSPITAL_COMMUNITY): Payer: Medicare Other

## 2017-05-21 LAB — CBC WITH DIFFERENTIAL/PLATELET
BASOS ABS: 0 10*3/uL (ref 0.0–0.1)
Basophils Relative: 0 %
EOS ABS: 0.3 10*3/uL (ref 0.0–0.7)
Eosinophils Relative: 1 %
HCT: 25.4 % — ABNORMAL LOW (ref 39.0–52.0)
Hemoglobin: 8.4 g/dL — ABNORMAL LOW (ref 13.0–17.0)
LYMPHS ABS: 2.4 10*3/uL (ref 0.7–4.0)
Lymphocytes Relative: 9 %
MCH: 27.2 pg (ref 26.0–34.0)
MCHC: 33.1 g/dL (ref 30.0–36.0)
MCV: 82.2 fL (ref 78.0–100.0)
MONO ABS: 2.1 10*3/uL — AB (ref 0.1–1.0)
Monocytes Relative: 8 %
NEUTROS ABS: 21.5 10*3/uL — AB (ref 1.7–7.7)
Neutrophils Relative %: 82 %
Platelets: 853 10*3/uL — ABNORMAL HIGH (ref 150–400)
RBC: 3.09 MIL/uL — AB (ref 4.22–5.81)
RDW: 15.4 % (ref 11.5–15.5)
WBC: 26.3 10*3/uL — AB (ref 4.0–10.5)

## 2017-05-21 LAB — COMPREHENSIVE METABOLIC PANEL
ALBUMIN: 1.5 g/dL — AB (ref 3.5–5.0)
ALK PHOS: 76 U/L (ref 38–126)
ALT: 19 U/L (ref 17–63)
ANION GAP: 4 — AB (ref 5–15)
AST: 26 U/L (ref 15–41)
BILIRUBIN TOTAL: 1 mg/dL (ref 0.3–1.2)
BUN: 19 mg/dL (ref 6–20)
CALCIUM: 7.8 mg/dL — AB (ref 8.9–10.3)
CO2: 31 mmol/L (ref 22–32)
CREATININE: 0.78 mg/dL (ref 0.61–1.24)
Chloride: 101 mmol/L (ref 101–111)
GLUCOSE: 146 mg/dL — AB (ref 65–99)
POTASSIUM: 4.8 mmol/L (ref 3.5–5.1)
Sodium: 136 mmol/L (ref 135–145)
TOTAL PROTEIN: 5.2 g/dL — AB (ref 6.5–8.1)

## 2017-05-21 LAB — GLUCOSE, CAPILLARY
GLUCOSE-CAPILLARY: 148 mg/dL — AB (ref 65–99)
GLUCOSE-CAPILLARY: 119 mg/dL — AB (ref 65–99)
Glucose-Capillary: 132 mg/dL — ABNORMAL HIGH (ref 65–99)
Glucose-Capillary: 133 mg/dL — ABNORMAL HIGH (ref 65–99)

## 2017-05-21 LAB — PHOSPHORUS: Phosphorus: 4.2 mg/dL (ref 2.5–4.6)

## 2017-05-21 LAB — MAGNESIUM: MAGNESIUM: 1.9 mg/dL (ref 1.7–2.4)

## 2017-05-21 MED ORDER — SODIUM CHLORIDE 0.9 % IV SOLN
200.0000 mg | Freq: Once | INTRAVENOUS | Status: AC
  Administered 2017-05-21: 200 mg via INTRAVENOUS
  Filled 2017-05-21: qty 200

## 2017-05-21 MED ORDER — FUROSEMIDE 10 MG/ML IJ SOLN
20.0000 mg | Freq: Once | INTRAMUSCULAR | Status: AC
  Administered 2017-05-21: 20 mg via INTRAVENOUS
  Filled 2017-05-21: qty 2

## 2017-05-21 MED ORDER — SODIUM CHLORIDE 0.9 % IV SOLN
100.0000 mg | INTRAVENOUS | Status: AC
  Administered 2017-05-22 – 2017-05-27 (×6): 100 mg via INTRAVENOUS
  Filled 2017-05-21 (×6): qty 100

## 2017-05-21 MED ORDER — DEXMEDETOMIDINE HCL IN NACL 200 MCG/50ML IV SOLN
0.0000 ug/kg/h | INTRAVENOUS | Status: AC
Start: 2017-05-22 — End: 2017-05-24
  Administered 2017-05-22: 0.4 ug/kg/h via INTRAVENOUS
  Filled 2017-05-21 (×2): qty 50

## 2017-05-21 MED ORDER — M.V.I. ADULT IV INJ
INTRAVENOUS | Status: AC
  Administered 2017-05-21: 17:00:00 via INTRAVENOUS
  Filled 2017-05-21 (×2): qty 3000

## 2017-05-21 NOTE — Progress Notes (Signed)
Patient became agitated while family rubbing and talking over patient. Patient bolused pain medication and sedation without resolution. Patient coughing and agitated. Mouth and ETT suctioned no resolution. New bleeding noted in NG tube. MD paged about bleeding in NG tube, MD instructed to hold suction for one hour and flush then resume suction and report further bleeding.

## 2017-05-21 NOTE — Progress Notes (Signed)
[facility] NOTE   Pharmacy Consult for TPN Indication: prolonged ileus, small bowel leak  Patient Measurements: Body mass index is 29.92 kg/m. Filed Weights   05/13/17 2100 05/16/17 0500 05/21/17 0555  Weight: 216 lb 0.8 oz (98 kg) 217 lb 13 oz (98.8 kg) 208 lb 8.9 oz (94.6 kg)   HPI: 63 yoM admitted on 7/11 for enlarging adrenal mass and planned adrenalectomy.  Pharmacy is now consulted to dose TPN.  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  Planning for return to OR in 48-72h 7/22 OR:  wash out, tube ileostomy, left with open abdomen & wound vac in place.  Plan for OR on 7/25 for closure 7/25 OR: mesh placed for abdominal closure, wound vac  Insulin requirements past 24 hours: 9 units SSI (no hx DM)  Current Nutrition: NPO  IVF: D5 1/2NS with 40 mEq KCL at 10 ml/hr  Central access: PICC line ordered 7/19, placed on 7/20 TPN start date: 7/20  ASSESSMENT                                                                                                           Today, 05/21/17 - Glucose: within goal < 150 with minimal SSI correction - Electrolytes: WNL - Renal:  SCr wnl, stable.  Lasix given 7/25 and 7/26 per PCCM d/t high volume TPN + continuous drips/abx.  I/O net -400 mL yesterday.  - LFTs: WNL, Tbili WNL - TGs:  296 (7/18), 266 (7/20), 269 (7/23).  Propofol d/c 7/24. - Prealbumin:  <5 (7/20), 6.1 (7/24)  NUTRITIONAL GOALS                                                                                             RD recs (7/24):  160 Protein,  3716-9678 Kcal  Clinimix  5/15 at a max fluid volume of 3L at 125 ml/hr  + 20% fat emulsion 240 ml/day to provide: 150 g of protein and 2610 kCals per day meeting 94% of protein and >100% of kCal needs. - Glucose infusion rate will be 3.2 mg/kg/min (Maximum 5 mg/kg/min) - Using Lipids only 3 times/week on MWF would provide: 150 g of protein and an average of 2336 kCals per day meeting 94% of protein and 100 % of kCal needs.  Based on ASPEN guidelines, while the patient meets ICU status  the initiation of lipids is being delayed for 7 days or until transition out of ICU. Planned start date for lipids is 05/22/17.  Goal will be to meet 100% of the patient's protein needs and approximately 80% of their caloric needs. Clinimix 5/15 @ 125 mL/hr to provide 150 grams protein (94% of goal) and 2130 kcal (97% of goal)    PLAN                                                                                                                          At 1800 today:  Continue Clinimix E 5/15 at 125 ml/hr.   Hold lipids for the first 7 days - plan to begin on 7/27  TPN to contain standard multivitamins daily and trace elements only M/W/F.  Continue IVF at [place] and moderate SSI q6h.  TPN lab panels on Mondays & Thursdays.  BMET, Mag, Phos 7/27-7/29  Monitor I/O with high volume TPN  Gretta Arab PharmD, BCPS Pager [phone] 05/21/2017 10:20 AM

## 2017-05-21 NOTE — Progress Notes (Signed)
Date:  May 21, 2017 Chart reviewed for concurrent status and case management needs. Will continue to follow patient progress. Remains intubated and sedated/SB injury with leak to or [id number] for repair Discharge Planning: following for needs Expected discharge date: [id number] [name], BSN, [place], [place]

## 2017-05-21 NOTE — Progress Notes (Signed)
Progress Note: General Surgery Service   Assessment/Plan: Patient Active Problem List   Diagnosis Date Noted  . Acute respiratory failure with hypoxemia ([place])   . Ileus ([place])   . Hypokalemia 05/14/2017  . Anastomotic leak of intestine 05/14/2017  . Severe sepsis ([place]) 05/14/2017  . Wound dehiscence, surgical 05/14/2017  . Aspiration pneumonia ([place]) 05/14/2017  . Chronic narcotic use 05/10/2017  . Anxiety state 05/10/2017  . Intra-abdominal adhesions s/p SB resection 05/06/2017 05/09/2017  . H/O total adrenalectomy ([place]) 05/06/2017  . Right adrenal mass s/p adrenalectomy 05/06/2017 05/06/2017  . Throat symptom 03/18/2016  . Multinodular goiter 01/19/2016  . Hyperthyroidism 01/19/2016  . Essential hypertension   . Diarrhea 11/30/2014  . Anemia, iron deficiency 11/01/2014  . Leukocytosis 11/03/2013  . Tobacco abuse 07/26/2013  . COPD (chronic obstructive pulmonary disease) ([place]) 07/26/2013  . Left knee pain 07/26/2013  . Pain in joint, shoulder region 05/03/2013  . GERD 07/24/2008  . 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 -start antifungal due to 1 week open abdomen with multiple complications related to intestinal leak -continue vent management/wean per PCCM -continue NG tube to LIS, blake drain to bulb suction, foley ileal drains to gravity    LOS: 15 days  Chief Complaint/Subjective: Fever overnight, weaning sedation  Objective: Vital signs in last 24 hours: Temp:  [98.4 F (36.9 C)-101.7 F (38.7 C)] 98.4 F (36.9 C) (07/26 1200) Pulse Rate:  [100-127] 118 (07/26 1200) Resp:  [17-28] 26 (07/26 1200) BP: (104-141)/(57-98) 141/82 (07/26 1200) SpO2:  [94 %-100 %] 96 % (07/26 1200) FiO2 (%):  [40 %-100 %] 40 % (07/26 1156) Weight:  [94.6 kg (208 lb 8.9 oz)] 94.6 kg (208 lb 8.9 oz) (07/26 1111) Last  BM Date: 05/19/17  Intake/Output from previous day: 07/25 0701 - 07/26 0700 In: 4676 [I.V.:4136; IV Piggyback:500] Out: 5075 [Urine:3700; Emesis/NG output:700; Drains:550; Blood:125] Intake/Output this shift: Total I/O In: 278.1 [I.V.:278.1] Out: 150 [Urine:100; Emesis/NG output:50]  Lungs: CTAB  Cardiovascular: tachycardic  Abd: soft, vac in place with no surrounding erythema, thin serosanguinous fluid in canister and in blake drain bulb. Small amount of green drainage from proximal ileal drain  Extremities: no edema  Neuro: eyes open, moves all extremities  Lab Results: CBC   Recent Labs  05/20/17 0338 05/21/17 0332  WBC 23.3* 26.3*  HGB 8.2* 8.4*  HCT 24.3* 25.4*  PLT 753* 853*   BMET  Recent Labs  05/20/17 0338 05/21/17 0332  NA 140 136  K 4.2 4.8  CL 105 101  CO2 30 31  GLUCOSE 121* 146*  BUN 17 19  CREATININE 0.68 0.78  CALCIUM 7.7* 7.8*   PT/INR No results for input(s): LABPROT, INR in the last 72 hours. ABG No results for input(s): PHART, HCO3 in the last 72 hours.  Invalid input(s): PCO2, PO2  Studies/Results:  Anti-infectives: Anti-infectives    Start     Dose/Rate Route Frequency Ordered Stop   05/15/17 1000  anidulafungin (ERAXIS) 100 mg in sodium chloride 0.9 % 100 mL IVPB  Status:  Discontinued     100 mg 78 mL/hr over 100 Minutes Intravenous Every 24 hours 05/14/17 0829 05/16/17 0901   05/15/17 1000  metroNIDAZOLE (FLAGYL) IVPB 500 mg     500 mg 100 mL/hr over 60 Minutes Intravenous Every 8 hours 05/15/17 0903     05/14/17 0900  anidulafungin (ERAXIS) 200 mg in sodium chloride 0.9 % 200 mL IVPB     200 mg 78 mL/hr over 200 Minutes Intravenous  Once 05/14/17 0829 05/14/17 1325   05/13/17 1927  vancomycin (VANCOCIN) 1-5 GM/200ML-% IVPB    Comments:  Ward, Christa   : cabinet override      05/13/17 1927 05/14/17 0744   05/12/17 1400  ceFEPIme (MAXIPIME) 1 g in dextrose 5 % 50 mL IVPB     1 g 100 mL/hr over 30 Minutes Intravenous  Every 8 hours 05/12/17 0939     05/12/17 0900  vancomycin (VANCOCIN) IVPB 1000 mg/200 mL premix  Status:  Discontinued     1,000 mg 200 mL/hr over 60 Minutes Intravenous Every 12 hours 05/12/17 0750 05/16/17 0852   05/12/17 0830  aztreonam (AZACTAM) 2 GM IVPB     2 g 100 mL/hr over 30 Minutes Intravenous  Once 05/12/17 0800 05/12/17 0957   05/06/17 0916  vancomycin (VANCOCIN) IVPB 1000 mg/200 mL premix     1,000 mg 200 mL/hr over 60 Minutes Intravenous On call to O.R. 05/06/17 0916 05/06/17 1229      Medications: Scheduled Meds: . bisacodyl  10 mg Rectal Daily  . chlorhexidine gluconate (MEDLINE KIT)  15 mL Mouth Rinse BID  . Chlorhexidine Gluconate Cloth  6 each Topical Daily  . Chlorhexidine Gluconate Cloth  6 each Topical Q0600  . enoxaparin (LOVENOX) injection  40 mg Subcutaneous Q24H  . insulin aspart  0-15 Units Subcutaneous Q6H  . ipratropium-albuterol  3 mL Nebulization Q6H  . lip balm  1 application Topical BID  . mouth rinse  15 mL Mouth Rinse QID  . nicotine  21 mg Transdermal Daily  . pantoprazole (PROTONIX) IV  40 mg Intravenous Q24H  . thiamine injection  100 mg Intravenous Daily   Continuous Infusions: . Marland KitchenTPN (CLINIMIX-E) Adult 125 mL/hr at 05/21/17 0800  . Marland KitchenTPN (CLINIMIX-E) Adult    . ceFEPime (MAXIPIME) IV Stopped (05/21/17 4103)  . dextrose 5 % and 0.45 % NaCl with KCl 40 mEq/L 10 mL/hr at 05/21/17 0800  . HYDROmorphone 4 mg/hr (05/21/17 1110)  . metronidazole Stopped (05/21/17 1024)  . midazolam (VERSED) infusion 3 mg/hr (05/21/17 1113)   PRN Meds:.acetaminophen, acetaminophen, HYDROmorphone, labetalol, magic mouthwash, midazolam, ondansetron **OR** ondansetron (ZOFRAN) IV, ondansetron (ZOFRAN) IV, sodium chloride flush  Mickeal Skinner, MD Pg# [phone] [facility]

## 2017-05-21 NOTE — Progress Notes (Addendum)
Nutrition Follow-up  DOCUMENTATION CODES:   Not applicable  INTERVENTION:  - Continue TPN per Pharmacy. - Will monitor for continued plan concerning nutrition support.   NUTRITION DIAGNOSIS:   Inadequate oral intake related to inability to eat as evidenced by NPO status. -ongoing  GOAL:   Patient will meet greater than or equal to 90% of their needs -met with current TPN regimen.   MONITOR:   Vent status, Weight trends, Labs, Skin, I & O's, Other (Comment) (TPN regimen)  ASSESSMENT:   56 y.o. 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 On 7/18, he developed a leak therefore was taken back to OR for re-do of ex-lap and repair of leak.  7/26 Pt returned to OR yesterday; will review Surgery note from procedure once available. PCCM NP note from this AM states belief that abdomen was closed yesterday but not sure; plan to begin vent weaning once closure of abd can be confirmed. OGT removed and NGT placed while in the OR yesterday. NGT not secured in any way; but bilateral mittens on pt hands.   Pt with triple lumen PICC and is receiving Clinimix E 5/15 @ 125 mL/hr which is providing 150 grams of protein (94% estimated protein need) and 2130 kcal (94% minimum estimated kcal need). ILE able to be started tomorrow (7/27).  Patient is currently intubated on ventilator support MV: 11.7 L/min Temp (24hrs), Avg:100.1 F (37.8 C), Min:99 F (37.2 C), Max:101.7 F (38.7 C) BP: 119/70 and MAP: 83  Medications reviewed; 10 mg rectal Dulcolax/day, 20 mg IV Lasix x1 dose today, sliding scale Novolog, 40 mg IV Protonix/day, 100 mg IV thiamine/day.  Labs reviewed; CBG: 148 mg/dL this AM, Ca: 7.8 mg/dL.  IVF: D5-1/2 NS-40 mEq KCl @ 10 mL/hr (41 kcal from dextrose) Drips: Dilaudid @ 4 mg/hr, Versed @ 5  mg/hr.  ADDENDUM: Per RN during rounds, hopeful extubation 7/27. Abdomen was closed yesterday with plan for intestinal resection/reattachment in ~3 weeks. Spoke with Pharmacist who reports plan to add ILE tomorrow and possibly provide x3 days/week as to limit overfeeding of kcal.      7/25 - Spoke with RN this AM who also had pt during day shift yesterday.  - She reports new weight obtained yesterday afternoon and was 99.9 kg. - Continue to use weight of 89.4 kg in estimating needs.  - Plan was for return to [facility] today; Surgeon talked with pt's wife earlier this AM d/t concerns she had and her wanting to delay surgery.  - RN reports that if pt goes to OR, possible plan removal of OGT, placement of NGT, and extubation following surgery.  - Pt with triple lumen PICC and TPN regimen today remains the same as yesterday.   Patient is currently intubated on ventilator support MV: 11.2 L/min Temp (24hrs), Avg:100 F (37.8 C), Min:98.8 F (37.1 C), Max:100.9 F (38.3 C) BP: 117/70 and MAP: 82 IVF: D5-1/2 NS-40 mEq KCl @ 40 mL/hr (163 kcal from dextrose) Drip: Versed @ 6 mg/hr.     7/24 - Pt had a BM via ileostomy this AM.  - No new weight since 7/21; requested RN obtain a new weight during this shift.  - Pt with triple lumen PICC and is receiving Clinimix E 5/15 @ 83 mL/hr which is providing 100 grams of protein and 1414 kcal; ILE start date:  7/27.  - Per Pharmacy note this AM, plan to continue this rate today and to increase rate following OR visit tomorrow.  - Per rounds this AM, Precedex led to hypotension yesterday and possible plan to re-try today.   Patient is currently intubated on ventilator support MV: 11.5L/min Temp (24hrs), Avg:99.2 F (37.3 C), Min:99 F (37.2 C), Max:99.5 F (37.5 C) BP: 136/76 and MAP: 94 IVF: D5-1/2 NS-40 mEq KCl @ 40 mL/hr (163 kcal from dextrose).  Drips: Versed @ 6 mg/hr, Dilaudid @ 4 mg/hr   *SEE CHART FOR PREVIOUS RD NOTES    Diet  Order:  Diet NPO time specified .TPN (CLINIMIX-E) Adult  Skin:  Wound (see comment) (Incisions to R flank and abdomen from 7/11 and 7/18) abdominal also from 7/19, 7/22, and 7/25   Last BM:  7/24  Height:   Ht Readings from Last 1 Encounters:  05/14/17 5' 10" (1.778 m)    Weight:   Wt Readings from Last 1 Encounters:  05/21/17 208 lb 8.9 oz (94.6 kg)    Ideal Body Weight:  75.45 kg  BMI:  Body mass index is 29.92 kg/m.  Estimated Nutritional Needs:   Kcal:  2278-2505 [facility] + x1.1 stress factor)  Protein:  160 grams of protein (1.8 grams/kg)  Fluid:  2.4 L/day  EDUCATION NEEDS:   No education needs identified at this time    Jarome Matin, MS, RD, LDN, CNSC Inpatient Clinical Dietitian Pager # [phone] After hours/weekend pager # [phone]

## 2017-05-21 NOTE — Progress Notes (Signed)
[facility] Note  ADMISSION DATE:  05/06/2017  CONSULTATION DATE:  05/13/17  REFERRING MD:  Kieth Brightly  CHIEF COMPLAINT:  Vent Management  Presenting HPI:  56 y.o. 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 On 7/18, he developed a leak therefore was taken back to OR for re-do of ex-lap and repair of leak.  He returned to the ICU on the vent and PCCM was consulted for vent management.  Subjective:  RN reports PEEP 5 / FiO2 50%, tmax 101.7, UOP 3700 with 62m lasix, remains positive balance overall  Vent Mode: PRVC FiO2 (%):  [35 %-100 %] 50 % Set Rate:  [16 bmp] 16 bmp Vt Set:  [580 mL] 580 mL PEEP:  [5 cmH20-10 cmH20] 5 cmH20 Plateau Pressure:  [18 cmH20-30 cmH20] 19 cmH20  Temp:  [99 F (37.2 C)-101.7 F (38.7 C)] 99 F (37.2 C) (07/26 0745) Pulse Rate:  [101-142] 106 (07/26 0600) Resp:  [16-28] 23 (07/26 0600) BP: (105-181)/(62-98) 121/74 (07/26 0600) SpO2:  [84 %-100 %] 98 % (07/26 0600) FiO2 (%):  [35 %-100 %] 50 % (07/26 0409) Weight:  [208 lb 8.9 oz (94.6 kg)] 208 lb 8.9 oz (94.6 kg) (07/26 0555)  General: ill appearing male in NAD on vent, wife at bedside HEENT: MM pink/moist, no jvd Neuro: sedate, responds to commands  CV: s1s2 rrr, tachy, no m/r/g PULM: even/non-labored, lungs bilaterally coarse, diminished RLL  GI: midline large catheter x2, RUQ VAC, JP drain x1 on R Extremities: warm/dry, 1-2+ generalized edema  Skin: no rashes or lesions  CBC Latest Ref Rng & Units 05/21/2017 05/20/2017 05/19/2017  WBC 4.0 - 10.5 K/uL 26.3(H) 23.3(H) 20.2(H)  Hemoglobin 13.0 - 17.0 g/dL 8.4(L) 8.2(L) 8.2(L)  Hematocrit 39.0 - 52.0 % 25.4(L) 24.3(L) 23.8(L)  Platelets 150 - 400 K/uL 853(H) 753(H) 668(H)    BMP Latest Ref Rng & Units 05/21/2017 05/20/2017 05/19/2017  Glucose 65 - 99  mg/dL 146(H) 121(H) 120(H)  BUN 6 - 20 mg/dL '19 17 18  ' Creatinine 0.61 - 1.24 mg/dL 0.78 0.68 0.68  Sodium 135 - 145 mmol/L 136 140 143  Potassium 3.5 - 5.1 mmol/L 4.8 4.2 3.7  Chloride 101 - 111 mmol/L 101 105 107  CO2 22 - 32 mmol/L '31 30 29  ' Calcium 8.9 - 10.3 mg/dL 7.8(L) 7.7(L) 7.7(L)    Hepatic Function Latest Ref Rng & Units 05/21/2017 05/19/2017 05/18/2017  Total Protein 6.5 - 8.1 g/dL 5.2(L) - 4.9(L)  Albumin 3.5 - 5.0 g/dL 1.5(L) 1.4(L) 1.3(L)  AST 15 - 41 U/L 26 - 39  ALT 17 - 63 U/L 19 - 22  Alk Phosphatase 38 - 126 U/L 76 - 76  Total Bilirubin 0.3 - 1.2 mg/dL 1.0 - 0.9    IMAGING/STUDIES: CT ABD/PELVIS W/ CONTRAST 7/18: IMPRESSION: 1. Diffuse dilatation of the small greater the large bowel with fluid levels throughout the small and large bowel, most compatible with adynamic ileus in the postoperative state. No bowel wall thickening. No discrete small bowel caliber transition to suggest obstruction. 2. Pneumoperitoneum in the right upper quadrant is presumably due to recent surgery . 3. Small postoperative fluid collection at the right adrenalectomy site without wall thickening or internal gas. 4. Small dependent right pleural effusion. 5. Patchy bibasilar lung consolidation and ground-glass attenuation with some volume loss, concerning for multilobar pneumonia and/or  aspiration with some atelectasis. PORT CXR 7/23: Improving left upper lobe opacity. Enteric feeding tube coursing below diaphragm and seeming to occur towards the fundus of stomach. Endotracheal tube in good position above the carina. No new focal opacity appreciated. Persistent silhouetting left hemidiaphragm. PORT CXR 7/25: small bilateral pleural effusions VENOUS DUPLEX 7/25 >> preliminary negative for DVT, superficial vein thrombosis in the cephalic vein  MICROBIOLOGY: MRSA PCR 7/11:  Negative Abdominal Wound Culture 7/18:  Klebsiella pneumoniae (resistant to Ampicillin) Blood Cultures x2 7/19: negative   Tracheal Aspirate Culture 7/22: multiple organisms present, none predominant  ANTIBIOTICS: Aztreonam 7/17 (x1 dose) Vancomycin 7/17 - 7/21 Cefeime 7/17 >>> Eraxis 7/19 >>> 7/21, 7/26 >> Flagyl 7/20 >>>  LINES/TUBES: OETT 07/18 >>>  RUE PICC 7/20 >>> RAD ART LINE 7/18 >>> 7/23 Foley 7/28 >>>  SIGNIFICANT EVENTS: 07/11 - Admit for laparoscopic converted to open right adrenal ectomy w/  Small bowel resection and reanastamosis. Wound vac placed. 07/18 - Found to have leak >> taken back to OR for re-do exploratory laparotomy. Returned to ICU on vent. 07/19 - NGT pulled out and almost self-extubated. Developed cuff leak >> tube exchanged. Back to OR emergently for intestinal leak/ischemic bowel. 07/22 - Back to OR for wash out >> left with open abdomen & wound vac in place. Transfused 1u PRBC. 07/23 - Intermittent agitation, sedation adjusted  07/25 - rising temp, WBC increased. Returned to OR for closure   ASSESSMENT/PLAN:  56 y.o. male admitted with adrenal mass positive for metanephrine's s/p right adrenalectomy complicated by small bowel injury in the setting of adhesions with sepsis secondary to intra-abdominal infection. Patient with persistent hypoxic respiratory failure.  Sepsis - in the setting of intra-abdominal infection with anastomotic leak resulting in peritonitis.   P: Continue abx & antifungals as above Repeat cultures if fever > 101.5  Trend PCT  ? Duration of abx given recurrent wash out and repeat leak (7/19), consider 14 days total  Acute hypoxic respiratory failure: in setting of sepsis, post-op adrenalectomy. P:  PRVC 8 cc/kg  Wean PEEP / FiO2 for sats > 92% Duoneb Q6 Repeat lasix 20 mg IV x1 Intermittent CXR  VAP prevention measures    Possible HCAP P: ABX as above  Follow CXR    Essential hypertension - in setting of pheochromocytoma s/p resection  P: Monitor vitals in ICU  PRN labetalol for SBP > 170   Intestinal perforation - in setting of  adhesions during adrenalectomy s/p reanastamosis P: Recommendations per CCS  Await surgical note from 7/25 > believe he was closed but will not wean until confirmed    Anemia - without bleeding  P: Trend CBC Lovenox for DVT prophylaxis  Transfuse for Hgb <7%   Thiamine   UE Swelling - preliminary [name] results negative  P: Monitor  Lasix as above   History of hypothyroidism  P: Repeat TSH in 6 weeks (~ 9/1)   Tachyphylaxis - while on propofol / fentanyl  P: Continue dilaudid / versed gtt  Wean sedation as able  Did not tolerate precedex due to hypotension    Hypokalemia  P: Monitor BMP  Replace electrolytes as indicated  D51/2NS with 40 mEq KCL @ 45m/hr   Hyperglycemia P: CBG Q4 with SSI    Protein calorie malnutrition P:  TPN per pharmacy    Tobacco use disorder P: Nicotine patch  Tobacco cessation counseling once able    Prophylaxis:  SCD's, Lovenox, Protonix    Diet:  NPO. Continue TPN. Code Status:  Full Code   Disposition:  ICU. Remains sedated / intubated  Family Update: Retia (wife) updated on bedside rounds     CC Time: 30 minutes   Noe Gens, NP-C  Pulmonary & Critical Care Pgr: [phone] or if no answer [phone] 05/21/2017, 8:02 AM

## 2017-05-21 NOTE — Progress Notes (Signed)
[facility] Progress Note Patient Name: [name] [name] DOB: 1961/10/05 MRN: [id number]   Date of Service  05/21/2017  HPI/Events of Note  Call from nurse reporting that patient remains agitated on vent despite dilaudid at 4 and versed at 8.  Tachy to 155 but also with fever of 101.  HD stable.  RR is 39 with sats of 91%.  Had been on propofol but TG 269.  eICU Interventions  Plan: RASS goal of -1 Start precedex Continue versed and dilaudid Tylenol PRN already ordered for fever     Intervention Category Major Interventions: Delirium, psychosis, severe agitation - evaluation and management  Geniva Lohnes 05/21/2017, 11:51 PM

## 2017-05-22 ENCOUNTER — Encounter (HOSPITAL_COMMUNITY): Payer: Self-pay | Admitting: General Surgery

## 2017-05-22 ENCOUNTER — Inpatient Hospital Stay (HOSPITAL_COMMUNITY): Payer: Medicare Other

## 2017-05-22 DIAGNOSIS — J9809 Other diseases of bronchus, not elsewhere classified: Secondary | ICD-10-CM

## 2017-05-22 DIAGNOSIS — T17500A Unspecified foreign body in bronchus causing asphyxiation, initial encounter: Secondary | ICD-10-CM

## 2017-05-22 LAB — MAGNESIUM: Magnesium: 1.9 mg/dL (ref 1.7–2.4)

## 2017-05-22 LAB — GLUCOSE, CAPILLARY
GLUCOSE-CAPILLARY: 140 mg/dL — AB (ref 65–99)
GLUCOSE-CAPILLARY: 148 mg/dL — AB (ref 65–99)
Glucose-Capillary: 143 mg/dL — ABNORMAL HIGH (ref 65–99)
Glucose-Capillary: 150 mg/dL — ABNORMAL HIGH (ref 65–99)

## 2017-05-22 LAB — CBC
HCT: 21.8 % — ABNORMAL LOW (ref 39.0–52.0)
Hemoglobin: 7.3 g/dL — ABNORMAL LOW (ref 13.0–17.0)
MCH: 27.9 pg (ref 26.0–34.0)
MCHC: 33.5 g/dL (ref 30.0–36.0)
MCV: 83.2 fL (ref 78.0–100.0)
PLATELETS: 836 10*3/uL — AB (ref 150–400)
RBC: 2.62 MIL/uL — AB (ref 4.22–5.81)
RDW: 15.9 % — ABNORMAL HIGH (ref 11.5–15.5)
WBC: 25.4 10*3/uL — ABNORMAL HIGH (ref 4.0–10.5)

## 2017-05-22 LAB — URINALYSIS, ROUTINE W REFLEX MICROSCOPIC
Bilirubin Urine: NEGATIVE
GLUCOSE, UA: NEGATIVE mg/dL
KETONES UR: NEGATIVE mg/dL
Nitrite: NEGATIVE
Protein, ur: NEGATIVE mg/dL
SQUAMOUS EPITHELIAL / LPF: NONE SEEN
Specific Gravity, Urine: 1.021 (ref 1.005–1.030)
pH: 6 (ref 5.0–8.0)

## 2017-05-22 LAB — BASIC METABOLIC PANEL
ANION GAP: 8 (ref 5–15)
BUN: 28 mg/dL — ABNORMAL HIGH (ref 6–20)
CALCIUM: 8 mg/dL — AB (ref 8.9–10.3)
CO2: 27 mmol/L (ref 22–32)
Chloride: 100 mmol/L — ABNORMAL LOW (ref 101–111)
Creatinine, Ser: 0.9 mg/dL (ref 0.61–1.24)
GFR calc Af Amer: >60 mL/min (ref >60)
GLUCOSE: 143 mg/dL — AB (ref 65–99)
Potassium: 5.4 mmol/L — ABNORMAL HIGH (ref 3.5–5.1)
Sodium: 135 mmol/L (ref 135–145)

## 2017-05-22 LAB — PHOSPHORUS: Phosphorus: 5.1 mg/dL — ABNORMAL HIGH (ref 2.5–4.6)

## 2017-05-22 MED ORDER — DEXTROSE-NACL 5-0.45 % IV SOLN
INTRAVENOUS | Status: DC
  Administered 2017-05-25 – 2017-06-02 (×4): via INTRAVENOUS

## 2017-05-22 MED ORDER — FAT EMULSION 20 % IV EMUL
240.0000 mL | INTRAVENOUS | Status: AC
  Administered 2017-05-22: 240 mL via INTRAVENOUS
  Filled 2017-05-22: qty 250

## 2017-05-22 MED ORDER — TRACE MINERALS CR-CU-MN-SE-ZN 10-1000-500-60 MCG/ML IV SOLN
INTRAVENOUS | Status: AC
  Administered 2017-05-22: 17:00:00 via INTRAVENOUS
  Filled 2017-05-22: qty 2000

## 2017-05-22 NOTE — Progress Notes (Signed)
[facility] Note  ADMISSION DATE:  05/06/2017  CONSULTATION DATE:  05/13/17  REFERRING MD:  Kieth Brightly  CHIEF COMPLAINT:  Vent Management  Presenting HPI:  56 y.o. 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 On 7/18, he developed a leak therefore was taken back to OR for re-do of ex-lap and repair of leak.  He returned to the ICU on the vent and PCCM was consulted for vent management.  Subjective:  Remains 14+L positive balance.  Tmax 101.5.  Precedex initiated due to agitation.  Versed gtt off around 0300.  Dilaudid gtt down to 25m.  Bloody drainage from NGT overnight.   Vent Mode: PRVC FiO2 (%):  [40 %-100 %] 40 % Set Rate:  [16 bmp] 16 bmp Vt Set:  [580 mL] 580 mL PEEP:  [5 cmH20] 5 cmH20 Plateau Pressure:  [19 cmH20-24 cmH20] 19 cmH20  Temp:  [98.4 F (36.9 C)-101.5 F (38.6 C)] 100.5 F (38.1 C) (07/27 0600) Pulse Rate:  [89-147] 114 (07/27 0645) Resp:  [17-39] 25 (07/27 0645) BP: (71-141)/(32-82) 114/59 (07/27 0645) SpO2:  [92 %-100 %] 96 % (07/27 0645) FiO2 (%):  [40 %-100 %] 40 % (07/27 0420) Weight:  [208 lb 8.9 oz (94.6 kg)] 208 lb 8.9 oz (94.6 kg) (07/26 1111)  General: ill appearing adult male in NAD on vent, wife at bedside HEENT: MM pink/moist, ETT, no jvd PSY: calm/appropriate Neuro: Awakens to voice, follows commands CV: s1s2 rrr, no m/r/g PULM: even/non-labored, lungs bilaterally coarse, diminished BLL GI: midline catheter x2 with small amt yellow / brown drainage around site, RUQ VAC changed > wound clean Extremities: warm/dry, 1+ generalized edema  Skin: small amt skin irritation from dressing changes on abd  CBC Latest Ref Rng & Units 05/21/2017 05/20/2017 05/19/2017  WBC 4.0 - 10.5 K/uL 26.3(H) 23.3(H) 20.2(H)  Hemoglobin 13.0 - 17.0 g/dL 8.4(L) 8.2(L)  8.2(L)  Hematocrit 39.0 - 52.0 % 25.4(L) 24.3(L) 23.8(L)  Platelets 150 - 400 K/uL 853(H) 753(H) 668(H)    BMP Latest Ref Rng & Units 05/22/2017 05/21/2017 05/20/2017  Glucose 65 - 99 mg/dL 143(H) 146(H) 121(H)  BUN 6 - 20 mg/dL 28(H) 19 17  Creatinine 0.61 - 1.24 mg/dL 0.90 0.78 0.68  Sodium 135 - 145 mmol/L 135 136 140  Potassium 3.5 - 5.1 mmol/L 5.4(H) 4.8 4.2  Chloride 101 - 111 mmol/L 100(L) 101 105  CO2 22 - 32 mmol/L '27 31 30  ' Calcium 8.9 - 10.3 mg/dL 8.0(L) 7.8(L) 7.7(L)    Hepatic Function Latest Ref Rng & Units 05/21/2017 05/19/2017 05/18/2017  Total Protein 6.5 - 8.1 g/dL 5.2(L) - 4.9(L)  Albumin 3.5 - 5.0 g/dL 1.5(L) 1.4(L) 1.3(L)  AST 15 - 41 U/L 26 - 39  ALT 17 - 63 U/L 19 - 22  Alk Phosphatase 38 - 126 U/L 76 - 76  Total Bilirubin 0.3 - 1.2 mg/dL 1.0 - 0.9    IMAGING/STUDIES: CT ABD/PELVIS W/ CONTRAST 7/18: IMPRESSION: 1. Diffuse dilatation of the small greater the large bowel with fluid levels throughout the small and large bowel, most compatible with adynamic ileus in the postoperative state. No bowel wall thickening. No discrete small bowel caliber transition to suggest obstruction. 2. Pneumoperitoneum in the right upper quadrant is presumably due to recent surgery . 3. Small postoperative fluid collection at the right adrenalectomy site without wall  thickening or internal gas. 4. Small dependent right pleural effusion. 5. Patchy bibasilar lung consolidation and ground-glass attenuation with some volume loss, concerning for multilobar pneumonia and/or aspiration with some atelectasis. PORT CXR 7/23: Improving left upper lobe opacity. Enteric feeding tube coursing below diaphragm and seeming to occur towards the fundus of stomach. Endotracheal tube in good position above the carina. No new focal opacity appreciated. Persistent silhouetting left hemidiaphragm. PORT CXR 7/25: small bilateral pleural effusions VENOUS DUPLEX 7/25 >> preliminary negative for DVT, superficial  vein thrombosis in the cephalic vein  MICROBIOLOGY: MRSA PCR 7/11:  Negative Abdominal Wound Culture 7/18:  Klebsiella pneumoniae (resistant to Ampicillin) Blood Cultures x2 7/19: negative  Tracheal Aspirate Culture 7/22: multiple organisms present, none predominant  ANTIBIOTICS: Aztreonam 7/17 (x1 dose) Vancomycin 7/17 - 7/21 Cefeime 7/17 >>> Eraxis 7/19 >>> 7/21, 7/26 >> Flagyl 7/20 >>>  LINES/TUBES: OETT 07/18 >>>  RUE PICC 7/20 >>> RAD ART LINE 7/18 >>> 7/23 Foley 7/28 >>>  SIGNIFICANT EVENTS: 07/11 - Admit for laparoscopic converted to open right adrenal ectomy w/  Small bowel resection and reanastamosis. Wound vac placed. 07/18 - Found to have leak >> taken back to OR for re-do exploratory laparotomy. Returned to ICU on vent. 07/19 - NGT pulled out and almost self-extubated. Developed cuff leak >> tube exchanged. Back to OR emergently for intestinal leak/ischemic bowel. 07/22 - Back to OR for wash out >> left with open abdomen & wound vac in place. Transfused 1u PRBC. 07/23 - Intermittent agitation, sedation adjusted  07/25 - rising temp, WBC increased. Returned to OR for closure 07/27 - Recultured, sedation down on precedex   ASSESSMENT/PLAN:  56 y.o. male admitted with adrenal mass positive for metanephrine's s/p right adrenalectomy complicated by small bowel injury in the setting of adhesions with sepsis secondary to intra-abdominal infection. Patient with persistent hypoxic respiratory failure.  Sepsis - in the setting of intra-abdominal infection with anastomotic leak resulting in peritonitis.   Fever - intermittent, ongoing.   P: Continue antibiotics & antifungals as above Repeat cultures  Trend PCT  Consider 14 days total abx / antifungals    Acute hypoxic respiratory failure: in setting of sepsis, post-op adrenalectomy. 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 s/p reanastamosis P: Recommendations per CCS OK from surgical standpoint for extubation once pt meets criteria Reduce NGT suction to ~ 40 on LIS  Anemia - bloody drainage from NGT  P: Trend CBC  Lovenox for DVT prophylaxis  Transfuse for Hgb <7% Thiamine Minimize lab draws as able     UE Swelling - preliminary [name] results negative  P: Monitor / supportive care Hold lasix 7/27   History of hypothyroidism  P: Repeat TSH in 6 weeks (~ 9/1)   Tachyphylaxis - while on propofol / fentanyl  P: Continue dilaudid gtt for pain  Precedex gtt added overnight 7/27, attempt to wean versed gtt off  Has not tolerated precedex in the past due to hypotension (? If this was sepsis related or intolerance)   Hypokalemia  P: Monitor BMP  Replace electrolytes as indicated D5/15 NS with 40 mEq KCL @ 44m/hr  Hyperglycemia P: CBG with SSI    Protein calorie malnutrition P:  TPN per Pharmacy    Tobacco use disorder P: Nicotine patch  Tobacco cessation counseling once able  Prophylaxis:  SCD's, Lovenox, Protonix    Diet:  NPO. Continue TPN. Code Status:  Full Code   Disposition:  ICU. Remains sedated / intubated  Family Update: Retia (wife) updated on am rounding 7/27     CC Time: 84 minutes   Noe Gens, NP-C [facility] Pgr: [phone] or if no answer [phone] 05/22/2017, 7:31 AM

## 2017-05-22 NOTE — Progress Notes (Signed)
Sputum collected per order- given to RN.

## 2017-05-22 NOTE — Progress Notes (Signed)
Progress Note: General Surgery Service   Assessment/Plan: Patient Active Problem List   Diagnosis Date Noted  . Acute respiratory failure with hypoxemia ([place])   . Ileus ([place])   . Hypokalemia 05/14/2017  . Anastomotic leak of intestine 05/14/2017  . Severe sepsis ([place]) 05/14/2017  . Wound dehiscence, surgical 05/14/2017  . Aspiration pneumonia ([place]) 05/14/2017  . Chronic narcotic use 05/10/2017  . Anxiety state 05/10/2017  . Intra-abdominal adhesions s/p SB resection 05/06/2017 05/09/2017  . 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 Right adrenal mass s/p adrenalectomy 05/06/2017 05/06/2017  . Throat symptom 03/18/2016  . Multinodular goiter 01/19/2016  . Hyperthyroidism 01/19/2016  . Essential hypertension   . Diarrhea 11/30/2014  . Anemia, iron deficiency 11/01/2014  . Leukocytosis 11/03/2013  . Tobacco abuse 07/26/2013  . COPD (chronic obstructive pulmonary disease) ([place]) 07/26/2013  . Left knee pain 07/26/2013  . Pain in joint, shoulder region 05/03/2013  . GERD 07/24/2008  . 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   s/p Procedure(s): ABDOMINAL WOUND [place] OUT, WOUND CLOSURE AND WOUND VAC PLACEMENT 05/20/2017  -vac changed at bedside today -continue abx -cultures today -sedation and vent per CCM -continue drain management -NG suction decreased slightly   LOS: 16 days  Chief Complaint/Subjective: Some blood in NG overnight, fever overnight  Objective: Vital signs in last 24 hours: Temp:  [98.4 F (36.9 C)-101.5 F (38.6 C)] 100.5 F (38.1 C) (07/27 0600) Pulse Rate:  [89-147] 114 (07/27 0645) Resp:  [17-39] 25 (07/27 0645) BP: (71-141)/(32-82) 114/59 (07/27 0645) SpO2:  [92 %-100 %] 96 % (07/27 0645) FiO2 (%):  [40 %-100 %] 40 % (07/27 0420) Weight:  [94.6 kg (208 lb 8.9 oz)] 94.6 kg (208 lb 8.9 oz) (07/26 1111) Last BM Date: 05/19/17  Intake/Output from previous day: 07/26 0701 - 07/27 0700 In: 4024.2 [I.V.:3214.2; IV Piggyback:710] Out: 8016  [Urine:2105; Emesis/NG output:1200; Drains:565] Intake/Output this shift: No intake/output data recorded.  Cardiovascular: tachycardic  Abd: vac changed, some film overlying the mesh area, small amount drainage around the superior foley tube.   Extremities: no edema  Neuro: GCS 11T, communicating effectively with hand gestures  Lab Results: CBC   Recent Labs  05/21/17 0332 05/22/17 0547  WBC 26.3* 25.4*  HGB 8.4* 7.3*  HCT 25.4* 21.8*  PLT 853* 836*   BMET  Recent Labs  05/21/17 0332 05/22/17 0541  NA 136 135  K 4.8 5.4*  CL 101 100*  CO2 31 27  GLUCOSE 146* 143*  BUN 19 28*  CREATININE 0.78 0.90  CALCIUM 7.8* 8.0*   PT/INR No results for input(s): LABPROT, INR in the last 72 hours. ABG No results for input(s): PHART, HCO3 in the last 72 hours.  Invalid input(s): PCO2, PO2  Studies/Results:  Anti-infectives: Anti-infectives    Start     Dose/Rate Route Frequency Ordered Stop   05/22/17 1400  anidulafungin (ERAXIS) 100 mg in sodium chloride 0.9 % 100 mL IVPB     100 mg 78 mL/hr over 100 Minutes Intravenous Every 24 hours 05/21/17 1330     05/21/17 1400  anidulafungin (ERAXIS) 200 mg in sodium chloride 0.9 % 200 mL IVPB     200 mg 78 mL/hr over 200 Minutes Intravenous  Once 05/21/17 1330 05/21/17 1940   05/15/17 1000  anidulafungin (ERAXIS) 100 mg in sodium chloride 0.9 % 100 mL IVPB  Status:  Discontinued     100 mg 78 mL/hr over 100 Minutes Intravenous Every 24 hours 05/14/17 0829 05/16/17 0901   05/15/17  1000  metroNIDAZOLE (FLAGYL) IVPB 500 mg     500 mg 100 mL/hr over 60 Minutes Intravenous Every 8 hours 05/15/17 0903     05/14/17 0900  anidulafungin (ERAXIS) 200 mg in sodium chloride 0.9 % 200 mL IVPB     200 mg 78 mL/hr over 200 Minutes Intravenous  Once 05/14/17 0829 05/14/17 1325   05/13/17 1927  vancomycin (VANCOCIN) 1-5 GM/200ML-% IVPB    Comments:  Ward, Christa   : cabinet override      05/13/17 1927 05/14/17 0744   05/12/17 1400   ceFEPIme (MAXIPIME) 1 g in dextrose 5 % 50 mL IVPB     1 g 100 mL/hr over 30 Minutes Intravenous Every 8 hours 05/12/17 0939     05/12/17 0900  vancomycin (VANCOCIN) IVPB 1000 mg/200 mL premix  Status:  Discontinued     1,000 mg 200 mL/hr over 60 Minutes Intravenous Every 12 hours 05/12/17 0750 05/16/17 0852   05/12/17 0830  aztreonam (AZACTAM) 2 GM IVPB     2 g 100 mL/hr over 30 Minutes Intravenous  Once 05/12/17 0800 05/12/17 0957   05/06/17 0916  vancomycin (VANCOCIN) IVPB 1000 mg/200 mL premix     1,000 mg 200 mL/hr over 60 Minutes Intravenous On call to O.R. 05/06/17 0916 05/06/17 1229      Medications: Scheduled Meds: . bisacodyl  10 mg Rectal Daily  . chlorhexidine gluconate (MEDLINE KIT)  15 mL Mouth Rinse BID  . Chlorhexidine Gluconate Cloth  6 each Topical Daily  . Chlorhexidine Gluconate Cloth  6 each Topical Q0600  . enoxaparin (LOVENOX) injection  40 mg Subcutaneous Q24H  . insulin aspart  0-15 Units Subcutaneous Q6H  . ipratropium-albuterol  3 mL Nebulization Q6H  . lip balm  1 application Topical BID  . mouth rinse  15 mL Mouth Rinse QID  . nicotine  21 mg Transdermal Daily  . pantoprazole (PROTONIX) IV  40 mg Intravenous Q24H  . thiamine injection  100 mg Intravenous Daily   Continuous Infusions: . Marland KitchenTPN (CLINIMIX-E) Adult 125 mL/hr at 05/21/17 1717  . anidulafungin    . ceFEPime (MAXIPIME) IV Stopped (05/22/17 0612)  . dexmedetomidine (PRECEDEX) IV infusion 0.2 mcg/kg/hr (05/22/17 0246)  . dextrose 5 % and 0.45% NaCl    . HYDROmorphone 2 mg/hr (05/22/17 0981)  . metronidazole Stopped (05/22/17 0252)  . midazolam (VERSED) infusion Stopped (05/22/17 0335)   PRN Meds:.acetaminophen, acetaminophen, HYDROmorphone, labetalol, magic mouthwash, midazolam, ondansetron **OR** ondansetron (ZOFRAN) IV, ondansetron (ZOFRAN) IV, sodium chloride flush  Mickeal Skinner, MD Pg# [phone] [facility]

## 2017-05-22 NOTE — Progress Notes (Signed)
[place] NOTE   Pharmacy Consult for TPN Indication: prolonged ileus, small bowel leak  Patient Measurements: Body mass index is 29.92 kg/m. Filed Weights   05/16/17 0500 05/21/17 0555 05/21/17 1111  Weight: 217 lb 13 oz (98.8 kg) 208 lb 8.9 oz (94.6 kg) 208 lb 8.9 oz (94.6 kg)   HPI: 9 yoM admitted on 7/11 for enlarging adrenal mass and planned adrenalectomy.  Pharmacy is now consulted to dose TPN.  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  Planning for return to OR in 48-72h 7/22 OR:  wash out, tube ileostomy, left with open abdomen & wound vac in place.  Plan for OR on 7/25 for closure 7/25 OR: mesh placed for abdominal closure, wound vac  Insulin requirements past 24 hours: 8 units SSI (no hx DM)  Current Nutrition: NPO  IVF: D5 1/2NS at10 ml/hr  Central access: PICC line ordered 7/19, placed on 7/20 TPN start date: 7/20  ASSESSMENT                                                                                                           Today, 05/22/17 - Glucose: within goal < 150 with minimal SSI correction - Electrolytes: K and phos now elevated above normal range - K removed from IVF's per Md orders already but was only running at 10 ml/hr - Renal:  SCr wnl, stable.  Lasix given 7/25 and 7/26 per PCCM d/t high volume TPN + continuous drips/abx.  I/O net -400 mL yesterday.  7/27 I/O: 4024/3870 net 154 mL - LFTs: WNL, Tbili WNL - TGs:  296 (7/18), 266 (7/20), 269 (7/23).  Propofol d/c 7/24. - Prealbumin:  <5 (7/20), 6.1 (7/24)  NUTRITIONAL GOALS                                                                                              RD recs (7/24):  160 Protein,  1610-9604 Kcal  Clinimix 5/15 at a max fluid volume of 3L at 125 ml/hr  + 20% fat emulsion 240 ml/day to provide: 150 g of protein and 2610 kCals per day meeting 94% of protein and >100% of kCal needs. - Glucose infusion rate will be 3.2 mg/kg/min (Maximum 5 mg/kg/min) - Using Lipids only 3 times/week on MWF would provide: 150 g of protein and an average of 2336  kCals per day meeting 94% of protein and 100 % of kCal needs.  Based on ASPEN guidelines, while the patient meets ICU status the initiation of lipids is being delayed for 7 days or until transition out of ICU. Planned start date for lipids is 05/22/17.  Goal will be to meet 100% of the patient's protein needs and approximately 80% of their caloric needs. Clinimix 5/15 @ 125 mL/hr to provide 150 grams protein (94% of goal) and 2130 kcal (97% of goal)    PLAN                                                                                                                          At 1800 today:  Due to elevated K and phos, will switch clinimix formula from E 5/15 to electrolyte free 5/20 formulation - Due to increased dextrose in lyte free formulation, will have to reduce rate from 125 ml/hr to 115 ml/hr. This will reduce the protein for time being from 150g to 138g.    Lipids also to start today after holding for first 7 days while in ICU status per policy - Will start lipids on MWF schedule for time being so lipids MWF + clinimix will make daily Kcal increase to 2174 kcal/day  TPN to contain standard multivitamins daily and trace elements only M/W/F.  Continue IVF at [place] and moderate SSI q6h.  TPN lab panels on Mondays & Thursdays.  BMET, Mag, Phos 7/27-7/29  Monitor I/O with high volume TPN   Adrian Saran, PharmD, BCPS Pager [phone] 05/22/2017 11:18 AM

## 2017-05-22 NOTE — Progress Notes (Signed)
Nutrition Follow-up  DOCUMENTATION CODES:   Not applicable  INTERVENTION:  - Continue TPN per Pharmacy. - Will monitor for vent status.  NUTRITION DIAGNOSIS:   Inadequate oral intake related to inability to eat as evidenced by NPO status. -ongoing  GOAL:   Patient will meet greater than or equal to 90% of their needs -met with current TPN regimen.   MONITOR:   Vent status, Weight trends, Labs, Skin, I & O's, Other (Comment) (TPN regimen)  ASSESSMENT:   56 y.o. 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 On 7/18, he developed a leak therefore was taken back to OR for re-do of ex-lap and repair of leak.  7/27 Pt with NGT in place with bloody drainage during night shift with last documented output of 150cc at 0315; ~675cc in canister at this time. No additional drainage documented since 0315. Spoke with RN at bedside who reports vent weaning attempted this AM but was unsuccessful and pt now back on full vent support. Estimated kcal updated; will continue to monitor vent status/ability for extubation and update estimated nutrition needs accordingly.   Pt continues with Clinimix E 5/15 @ 125 mL/hr. ILE able to be added today. Spoke with Pharmacist about pt/TPN. Noted hypernatremia and hyperphosphatemia; likely plan to switch to electrolyte-free Clinimix 5/15 d/t this. Recommend: Clinimix 5/15 @ 120 mL/hr with 20% ILE @ 20 mL/hr x12 hours. This regimen would provide 144 grams of protein (90% estimated need) and 2525 kcal.  Weight yesterday was -4.2 kg from 7/21 and +2.3 kg from admission. Continue to use UBW of 89.4 kg in estimating needs.   Patient is currently intubated on ventilator support MV: 12.9 L/min Temp (24hrs), Avg:100.1 F (37.8 C), Min:98.4 F (36.9 C), Max:101.5 F (38.6 C) BP: 126/79 and MAP:  89  Medications reviewed; sliding scale Novolog, 40 mg IV Protonix/day, 100 mg IV thiamine/day.  Labs reviewed; CBG: 143 mg/dL this AM, Na: 154 mmol/L, Phos: 5.1 mg/dL, Cl: 100 mmol/L, BUN: 28 mg/dL, Ca: 8 mg/dL.  IVF: D5-1/2 NS @ 10 mL/hr (41 kcal from dextrose) Drips: Dilaudid @ 2 mg/hr, Versed @ 1 mg/hr, Precedex @ 0.2 mcg/kg/hr.     7/26 - Pt returned to OR yesterday; will review Surgery note from procedure once available.  - OGT removed and NGT placed while in the OR yesterday.  - NGT not secured in any way; but bilateral mittens on pt hands.  - Pt with triple lumen PICC and is receiving Clinimix E 5/15 @ 125 mL/hr which is providing 150 grams of protein (94% estimated protein need) and 2130 kcal (94% minimum estimated kcal need).  - ILE able to be started tomorrow (7/27). - Per RN during rounds, hopeful extubation 7/27.  - Abdomen was closed yesterday with plan for intestinal resection/reattachment in ~3 weeks.  - Spoke with Pharmacist who reports plan to add ILE tomorrow and possibly provide x3 days/week as to limit overfeeding of kcal.   Patient is currently intubated on ventilator support MV: 11.7 L/min Temp (24hrs), Avg:100.1 F (37.8 C), Min:99 F (37.2 C), Max:101.7 F (38.7 C) BP: 119/70 and MAP: 83 IVF: D5-1/2 NS-40 mEq KCl @ 10 mL/hr (41 kcal from dextrose) Drips: Dilaudid @ 4 mg/hr, Versed @ 5 mg/hr.    7/25 - Spoke with RN this AM who also had pt during day shift yesterday.  -  She reports new weight obtained yesterday afternoon and was 99.9 kg. - Continue to use weight of 89.4 kg in estimating needs.  - Plan was for return to [facility] today; Surgeon talked with pt's wife earlier this AM d/t concerns she had and her wanting to delay surgery.  - RN reports that if pt goes to OR, possible plan removal of OGT, placement of NGT, and extubation following surgery.  - Pt with triple lumen PICC and TPN regimen today remains the same as yesterday.   Patient is currently  intubated on ventilator support MV: 11.2L/min Temp (24hrs), Avg:100 F (37.8 C), Min:98.8 F (37.1 C), Max:100.9 F (38.3 C) BP: 117/70 and MAP: 82 IVF:D5-1/2 NS-40 mEq KCl @ 40 mL/hr (163 kcal from dextrose) Drip: Versed @ 6 mg/hr.    *SEE CHART FOR PREVIOUS RD NOTES    Diet Order:  Diet NPO time specified .TPN (CLINIMIX-E) Adult  Skin:  Wound (see comment) (Incisions to R flank and abdomen from 7/11 and 7/18)  Last BM:  7/24  Height:   Ht Readings from Last 1 Encounters:  05/14/17 _0  (1.778 m)    Weight:   Wt Readings from Last 1 Encounters:  05/21/17 208 lb 8.9 oz (94.6 kg)    Ideal Body Weight:  75.45 kg  BMI:  Body mass index is 29.92 kg/m.  Estimated Nutritional Needs:   Kcal:  1950-9326 ([employer] + x1.1 stress factor)  Protein:  160 grams of protein (1.8 grams/kg)  Fluid:  2.4 L/day  EDUCATION NEEDS:   No education needs identified at this time    Jarome Matin, MS, RD, LDN, CNSC Inpatient Clinical Dietitian Pager # [phone] After hours/weekend pager # [phone]

## 2017-05-22 NOTE — Procedures (Signed)
PCCM Video Bronchoscopy Procedure Note  The patient was informed of the risks (including but not limited to bleeding, infection, respiratory failure, lung injury, tooth/oral injury) and benefits of the procedure and gave consent, see chart.  Indication: respiratory failure with hypoxemia, HCAP  Post Procedure Diagnosis: same  Location: [facility] ICU  Condition pre procedure: critically ill on vent  Medications for procedure: versed, dilaudid, etomidate  Procedure description: The bronchoscope was introduced through the endotracheal tube and passed to the bilateral lungs to the level of the subsegmental bronchi throughout the tracheobronchial tree.  Airway exam revealed thick grey mucus in the bronchus intermedius: non-obstructing, this was easily cleared.  There was also thick mucus in the left lower lobe completely obstructing the airway: this was also cleared with saline washing.    Procedures performed: suctionining of the airways  Specimens sent: resp culture from bronch wash  Condition post procedure: critically ill on vent  EBL: none from procedure  Complications: none  Roselie Awkward, MD [place] [facility]M Pager: [phone] Cell: [phone] After 3pm or if no response, call [phone]

## 2017-05-23 ENCOUNTER — Inpatient Hospital Stay (HOSPITAL_COMMUNITY): Payer: Medicare Other

## 2017-05-23 DIAGNOSIS — R601 Generalized edema: Secondary | ICD-10-CM

## 2017-05-23 LAB — COMPREHENSIVE METABOLIC PANEL
ALBUMIN: 1.5 g/dL — AB (ref 3.5–5.0)
ALK PHOS: 67 U/L (ref 38–126)
ALT: 13 U/L — AB (ref 17–63)
AST: 25 U/L (ref 15–41)
Anion gap: 8 (ref 5–15)
BILIRUBIN TOTAL: 0.4 mg/dL (ref 0.3–1.2)
BUN: 21 mg/dL — ABNORMAL HIGH (ref 6–20)
CALCIUM: 7.7 mg/dL — AB (ref 8.9–10.3)
CO2: 24 mmol/L (ref 22–32)
CREATININE: 0.72 mg/dL (ref 0.61–1.24)
Chloride: 101 mmol/L (ref 101–111)
GFR calc Af Amer: >60 mL/min (ref >60)
GFR calc non Af Amer: >60 mL/min (ref >60)
GLUCOSE: 147 mg/dL — AB (ref 65–99)
Potassium: 4.8 mmol/L (ref 3.5–5.1)
SODIUM: 133 mmol/L — AB (ref 135–145)
TOTAL PROTEIN: 5.3 g/dL — AB (ref 6.5–8.1)

## 2017-05-23 LAB — MAGNESIUM: MAGNESIUM: 1.7 mg/dL (ref 1.7–2.4)

## 2017-05-23 LAB — BLOOD CULTURE ID PANEL (REFLEXED)
ACINETOBACTER BAUMANNII: NOT DETECTED
Candida albicans: NOT DETECTED
Candida glabrata: NOT DETECTED
Candida krusei: NOT DETECTED
Candida parapsilosis: NOT DETECTED
Candida tropicalis: NOT DETECTED
ESCHERICHIA COLI: NOT DETECTED
Enterobacter cloacae complex: NOT DETECTED
Enterobacteriaceae species: NOT DETECTED
Enterococcus species: NOT DETECTED
HAEMOPHILUS INFLUENZAE: NOT DETECTED
Klebsiella oxytoca: NOT DETECTED
Klebsiella pneumoniae: NOT DETECTED
LISTERIA MONOCYTOGENES: NOT DETECTED
METHICILLIN RESISTANCE: DETECTED — AB
NEISSERIA MENINGITIDIS: NOT DETECTED
PSEUDOMONAS AERUGINOSA: NOT DETECTED
Proteus species: NOT DETECTED
SERRATIA MARCESCENS: NOT DETECTED
STAPHYLOCOCCUS AUREUS BCID: NOT DETECTED
STREPTOCOCCUS AGALACTIAE: NOT DETECTED
STREPTOCOCCUS PNEUMONIAE: NOT DETECTED
Staphylococcus species: DETECTED — AB
Streptococcus pyogenes: NOT DETECTED
Streptococcus species: NOT DETECTED

## 2017-05-23 LAB — GLUCOSE, CAPILLARY
Glucose-Capillary: 157 mg/dL — ABNORMAL HIGH (ref 65–99)
Glucose-Capillary: 134 mg/dL — ABNORMAL HIGH (ref 65–99)
Glucose-Capillary: 131 mg/dL — ABNORMAL HIGH (ref 65–99)
Glucose-Capillary: 131 mg/dL — ABNORMAL HIGH (ref 65–99)

## 2017-05-23 LAB — CBC
HEMATOCRIT: 22.7 % — AB (ref 39.0–52.0)
HEMOGLOBIN: 7.6 g/dL — AB (ref 13.0–17.0)
MCH: 27.3 pg (ref 26.0–34.0)
MCHC: 33.5 g/dL (ref 30.0–36.0)
MCV: 81.7 fL (ref 78.0–100.0)
Platelets: 714 10*3/uL — ABNORMAL HIGH (ref 150–400)
RBC: 2.78 MIL/uL — AB (ref 4.22–5.81)
RDW: 15.3 % (ref 11.5–15.5)
WBC: 17.3 10*3/uL — ABNORMAL HIGH (ref 4.0–10.5)

## 2017-05-23 LAB — PHOSPHORUS: PHOSPHORUS: 4.1 mg/dL (ref 2.5–4.6)

## 2017-05-23 MED ORDER — TRACE MINERALS CR-CU-MN-SE-ZN 10-1000-500-60 MCG/ML IV SOLN
INTRAVENOUS | Status: AC
  Administered 2017-05-23: 18:00:00 via INTRAVENOUS
  Filled 2017-05-23 (×2): qty 2760

## 2017-05-23 MED ORDER — PROPOFOL 1000 MG/100ML IV EMUL
0.0000 ug/kg/min | INTRAVENOUS | Status: DC
  Administered 2017-05-23 – 2017-05-24 (×2): 5 ug/kg/min via INTRAVENOUS
  Administered 2017-05-25: 15 ug/kg/min via INTRAVENOUS
  Administered 2017-05-25: 3 ug/kg/min via INTRAVENOUS
  Administered 2017-05-25 – 2017-05-26 (×4): 15 ug/kg/min via INTRAVENOUS
  Administered 2017-05-26: 20 ug/kg/min via INTRAVENOUS
  Administered 2017-05-27: 40 ug/kg/min via INTRAVENOUS
  Administered 2017-05-27 (×2): 25 ug/kg/min via INTRAVENOUS
  Administered 2017-05-28: 45 ug/kg/min via INTRAVENOUS
  Administered 2017-05-28: 50 ug/kg/min via INTRAVENOUS
  Filled 2017-05-23 (×14): qty 100

## 2017-05-23 MED ORDER — PROPOFOL 1000 MG/100ML IV EMUL
INTRAVENOUS | Status: AC
  Filled 2017-05-23: qty 100

## 2017-05-23 MED ORDER — FUROSEMIDE 10 MG/ML IJ SOLN
5.0000 mg/h | INTRAVENOUS | Status: AC
  Administered 2017-05-23: 5 mg/h via INTRAVENOUS
  Filled 2017-05-23: qty 20

## 2017-05-23 NOTE — Progress Notes (Signed)
[place] Progress Note Patient Name: [name] [name] DOB: 10/24/1961 MRN: [id number]   Date of Service  05/23/2017  HPI/Events of Note  Failing current sedation   eICU Interventions  Add prop BP okay     Intervention Category Evaluation Type: New Patient Evaluation  Raylene Miyamoto. 05/23/2017, 5:31 PM

## 2017-05-23 NOTE — Progress Notes (Signed)
[facility] Note  ADMISSION DATE:  05/06/2017  CONSULTATION DATE:  05/13/17  REFERRING MD:  Kieth Brightly  CHIEF COMPLAINT:  Vent Management  Presenting HPI:  56 y.o. 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 On 7/18, he developed a leak therefore was taken back to OR for re-do of ex-lap and repair of leak.  He returned to the ICU on the vent and PCCM was consulted for vent management.  Subjective:  Continues to have increased secretions and no weaning  Vent Mode: PRVC FiO2 (%):  [40 %] 40 % Set Rate:  [16 bmp] 16 bmp Vt Set:  [580 mL] 580 mL PEEP:  [5 cmH20] 5 cmH20 Plateau Pressure:  [17 cmH20-22 cmH20] 17 cmH20  Temp:  [99.4 F (37.4 C)-100.7 F (38.2 C)] 99.4 F (37.4 C) (07/28 0800) Pulse Rate:  [86-110] 96 (07/28 0816) Resp:  [16-31] 27 (07/28 0816) BP: (87-142)/(53-108) 112/64 (07/28 0800) SpO2:  [93 %-100 %] 97 % (07/28 0816) FiO2 (%):  [40 %] 40 % (07/28 0816)  General: Ill appearing adult male in NAD on vent, wife at bedside HEENT: MM pink/moist, ETT, no jvd PSY: Sedate Neuro: Sedate, awakens to stimulation CV: RRR, Nl S1/S2, -M/R/G. PULM: Coarse BS diffusely GI: midline catheter x2 with small amt yellow / brown drainage around site, RUQ VAC changed > wound clean Extremities: warm/dry, 1+ generalized edema  Skin: small amt skin irritation from dressing changes on abd  CBC Latest Ref Rng & Units 05/23/2017 05/22/2017 05/21/2017  WBC 4.0 - 10.5 K/uL 17.3(H) 25.4(H) 26.3(H)  Hemoglobin 13.0 - 17.0 g/dL 7.6(L) 7.3(L) 8.4(L)  Hematocrit 39.0 - 52.0 % 22.7(L) 21.8(L) 25.4(L)  Platelets 150 - 400 K/uL 714(H) 836(H) 853(H)   BMP Latest Ref Rng & Units 05/23/2017 05/22/2017 05/21/2017  Glucose 65 - 99 mg/dL 147(H) 143(H) 146(H)  BUN 6 - 20 mg/dL 21(H) 28(H) 19  Creatinine  0.61 - 1.24 mg/dL 0.72 0.90 0.78  Sodium 135 - 145 mmol/L 133(L) 135 136  Potassium 3.5 - 5.1 mmol/L 4.8 5.4(H) 4.8  Chloride 101 - 111 mmol/L 101 100(L) 101  CO2 22 - 32 mmol/L '24 27 31  ' Calcium 8.9 - 10.3 mg/dL 7.7(L) 8.0(L) 7.8(L)   Hepatic Function Latest Ref Rng & Units 05/23/2017 05/21/2017 05/19/2017  Total Protein 6.5 - 8.1 g/dL 5.3(L) 5.2(L) -  Albumin 3.5 - 5.0 g/dL 1.5(L) 1.5(L) 1.4(L)  AST 15 - 41 U/L 25 26 -  ALT 17 - 63 U/L 13(L) 19 -  Alk Phosphatase 38 - 126 U/L 67 76 -  Total Bilirubin 0.3 - 1.2 mg/dL 0.4 1.0 -   IMAGING/STUDIES: CT ABD/PELVIS W/ CONTRAST 7/18: IMPRESSION: 1. Diffuse dilatation of the small greater the large bowel with fluid levels throughout the small and large bowel, most compatible with adynamic ileus in the postoperative state. No bowel wall thickening. No discrete small bowel caliber transition to suggest obstruction. 2. Pneumoperitoneum in the right upper quadrant is presumably due to recent surgery . 3. Small postoperative fluid collection at the right adrenalectomy site without wall thickening or internal gas. 4. Small dependent right pleural effusion. 5. Patchy bibasilar lung consolidation and ground-glass attenuation with some volume loss, concerning for multilobar pneumonia and/or aspiration with some atelectasis. PORT CXR 7/23: Improving left upper lobe opacity. Enteric feeding tube coursing below diaphragm and seeming to occur  towards the fundus of stomach. Endotracheal tube in good position above the carina. No new focal opacity appreciated. Persistent silhouetting left hemidiaphragm. PORT CXR 7/25: small bilateral pleural effusions VENOUS DUPLEX 7/25 >> preliminary negative for DVT, superficial vein thrombosis in the cephalic vein  MICROBIOLOGY: MRSA PCR 7/11:  Negative Abdominal Wound Culture 7/18:  Klebsiella pneumoniae (resistant to Ampicillin) Blood Cultures x2 7/19: negative  Tracheal Aspirate Culture 7/22: multiple organisms present,  none predominant  ANTIBIOTICS: Aztreonam 7/17 (x1 dose) Vancomycin 7/17 - 7/21 Cefeime 7/17 >>> Eraxis 7/19 >>> 7/21, 7/26 >> Flagyl 7/20 >>>  LINES/TUBES: OETT 07/18 >>>  RUE PICC 7/20 >>> RAD ART LINE 7/18 >>> 7/23 Foley 7/28 >>>  SIGNIFICANT EVENTS: 07/11 - Admit for laparoscopic converted to open right adrenal ectomy w/  Small bowel resection and reanastamosis. Wound vac placed. 07/18 - Found to have leak >> taken back to OR for re-do exploratory laparotomy. Returned to ICU on vent. 07/19 - NGT pulled out and almost self-extubated. Developed cuff leak >> tube exchanged. Back to OR emergently for intestinal leak/ischemic bowel. 07/22 - Back to OR for wash out >> left with open abdomen & wound vac in place. Transfused 1u PRBC. 07/23 - Intermittent agitation, sedation adjusted  07/25 - rising temp, WBC increased. Returned to OR for closure 07/27 - Recultured, sedation down on precedex  ASSESSMENT/PLAN:  56 y.o. male admitted with adrenal mass positive for metanephrine's s/p right adrenalectomy complicated by small bowel injury in the setting of adhesions with sepsis secondary to intra-abdominal infection. Patient with persistent hypoxic respiratory failure.  Sepsis - in the setting of intra-abdominal infection with anastomotic leak resulting in peritonitis.   Fever - intermittent, ongoing.   P: Continue antibiotics & antifungals as above Repeat cultures  Trend PCT  Plan 14 days of antibiotics/antifungals  Acute hypoxic respiratory failure: in setting of sepsis, post-op adrenalectomy. 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 during adrenalectomy s/p reanastamosis P: Recommendations per CCS OK from surgical standpoint for extubation once pt meets criteria Reduce NGT suction TPN per pharmacy  Anemia - bloody drainage from NGT  P: Trend CBC  Lovenox for DVT prophylaxis  Transfuse for Hgb <7% Thiamine Minimize lab draws as able    UE Swelling - preliminary [name] results negative  P: Monitor / supportive care Hold lasix 7/27  History of hypothyroidism  P: Repeat TSH in 6 weeks (~ 9/1)  Tachyphylaxis - while on propofol / fentanyl  P: Continue dilaudid gtt for pain  Versed drip Has not tolerated precedex in the past due to hypotension, will continue sedation until properly diuresed  Hypokalemia  P: Monitor BMP  Replace electrolytes as indicated KVO IVF Lasix 5 mg/hr drip Would like to give zaroxolyn but NPO for now  Hyperglycemia P: CBG with SSI   Protein calorie malnutrition P:  TPN per Pharmacy   Tobacco use disorder P: Nicotine patch  Tobacco cessation counseling once able  Prophylaxis:  SCD's, Lovenox, Protonix    Diet:  NPO. Continue TPN. Code Status:  Full Code   Disposition:  ICU. Remains sedated / intubated  Family Update: Retia (wife) updated on  am rounding 7/28   The patient is critically ill with multiple organ systems failure and requires high complexity decision making for assessment and support, frequent evaluation and titration of therapies, application of advanced monitoring technologies and extensive interpretation of multiple databases.   Critical Care Time devoted to patient care services described in this note is  35  Minutes. This time reflects time of care of this signee Dr Jennet Maduro. This critical care time does not reflect procedure time, or teaching time or supervisory time of PA/NP/Med student/Med Resident etc but could involve care discussion time.  Rush Farmer, M.D. [facility] Pulmonary/Critical Care Medicine. Pager: [phone]. After hours  pager: [phone].

## 2017-05-23 NOTE — Progress Notes (Signed)
Patient ID: [name] [name], male   DOB: May 08, 1961, 56 y.o.   MRN: [id number]   Progress Note: [facility] Service   Chief Complaint/Subjective: Intubated. Had bronch yesterday afternoon. Low grade temps.   Objective: Vital signs in last 24 hours: Temp:  [99.4 F (37.4 C)-100.7 F (38.2 C)] 99.4 F (37.4 C) (07/28 0800) Pulse Rate:  [86-110] 96 (07/28 0816) Resp:  [16-31] 27 (07/28 0816) BP: (87-142)/(53-108) 112/68 (07/28 0600) SpO2:  [93 %-100 %] 97 % (07/28 0816) FiO2 (%):  [40 %] 40 % (07/28 0816) Last BM Date: 05/19/17  Intake/Output from previous day: 07/27 0701 - 07/28 0700 In: 2972.1 [I.V.:2312.1; IV Piggyback:580] Out: 4500 [Urine:2175; Emesis/NG output:1300; Drains:1025] Intake/Output this shift: No intake/output data recorded.  Lungs: mostly cta  Cardiovascular: reg  Abd: soft, wound vac RUQ, JP-serosang; other drains-bilious; wound vac -serosang  Extremities: no edema, +SCDs  Neuro: intubated, sedated  Lab Results: CBC   Recent Labs  05/22/17 0547 05/23/17 0345  WBC 25.4* 17.3*  HGB 7.3* 7.6*  HCT 21.8* 22.7*  PLT 836* 714*   BMET  Recent Labs  05/22/17 0541 05/23/17 0345  NA 135 133*  K 5.4* 4.8  CL 100* 101  CO2 27 24  GLUCOSE 143* 147*  BUN 28* 21*  CREATININE 0.90 0.72  CALCIUM 8.0* 7.7*   PT/INR No results for input(s): LABPROT, INR in the last 72 hours. ABG No results for input(s): PHART, HCO3 in the last 72 hours.  Invalid input(s): PCO2, PO2  Studies/Results:  Anti-infectives: Anti-infectives    Start     Dose/Rate Route Frequency Ordered Stop   05/22/17 1400  anidulafungin (ERAXIS) 100 mg in sodium chloride 0.9 % 100 mL IVPB     100 mg 78 mL/hr over 100 Minutes Intravenous Every 24 hours 05/21/17 1330     05/21/17 1400  anidulafungin (ERAXIS) 200 mg in sodium chloride 0.9 % 200 mL IVPB     200 mg 78 mL/hr over 200 Minutes Intravenous  Once 05/21/17 1330 05/21/17 1940   05/15/17 1000  anidulafungin (ERAXIS) 100 mg in  sodium chloride 0.9 % 100 mL IVPB  Status:  Discontinued     100 mg 78 mL/hr over 100 Minutes Intravenous Every 24 hours 05/14/17 0829 05/16/17 0901   05/15/17 1000  metroNIDAZOLE (FLAGYL) IVPB 500 mg     500 mg 100 mL/hr over 60 Minutes Intravenous Every 8 hours 05/15/17 0903     05/14/17 0900  anidulafungin (ERAXIS) 200 mg in sodium chloride 0.9 % 200 mL IVPB     200 mg 78 mL/hr over 200 Minutes Intravenous  Once 05/14/17 0829 05/14/17 1325   05/13/17 1927  vancomycin (VANCOCIN) 1-5 GM/200ML-% IVPB    Comments:  Ward, Christa   : cabinet override      05/13/17 1927 05/14/17 0744   05/12/17 1400  ceFEPIme (MAXIPIME) 1 g in dextrose 5 % 50 mL IVPB     1 g 100 mL/hr over 30 Minutes Intravenous Every 8 hours 05/12/17 0939     05/12/17 0900  vancomycin (VANCOCIN) IVPB 1000 mg/200 mL premix  Status:  Discontinued     1,000 mg 200 mL/hr over 60 Minutes Intravenous Every 12 hours 05/12/17 0750 05/16/17 0852   05/12/17 0830  aztreonam (AZACTAM) 2 GM IVPB     2 g 100 mL/hr over 30 Minutes Intravenous  Once 05/12/17 0800 05/12/17 0957   05/06/17 0916  vancomycin (VANCOCIN) IVPB 1000 mg/200 mL premix     1,000 mg 200  mL/hr over 60 Minutes Intravenous On call to O.R. 05/06/17 0916 05/06/17 1229      Medications: Scheduled Meds: . bisacodyl  10 mg Rectal Daily  . chlorhexidine gluconate (MEDLINE KIT)  15 mL Mouth Rinse BID  . Chlorhexidine Gluconate Cloth  6 each Topical Daily  . Chlorhexidine Gluconate Cloth  6 each Topical Q0600  . enoxaparin (LOVENOX) injection  40 mg Subcutaneous Q24H  . insulin aspart  0-15 Units Subcutaneous Q6H  . ipratropium-albuterol  3 mL Nebulization Q6H  . lip balm  1 application Topical BID  . mouth rinse  15 mL Mouth Rinse QID  . nicotine  21 mg Transdermal Daily  . pantoprazole (PROTONIX) IV  40 mg Intravenous Q24H  . thiamine injection  100 mg Intravenous Daily   Continuous Infusions: . anidulafungin Stopped (05/22/17 1607)  . ceFEPime (MAXIPIME) IV  Stopped (05/23/17 0645)  . dexmedetomidine (PRECEDEX) IV infusion Stopped (05/22/17 1200)  . dextrose 5 % and 0.45% NaCl    . HYDROmorphone 4 mg/hr (05/23/17 0045)  . metronidazole Stopped (05/23/17 0359)  . midazolam (VERSED) infusion 8 mg/hr (05/23/17 0415)  . TPN (CLINIMIX) Adult without lytes 115 mL/hr at 05/22/17 1716   PRN Meds:.acetaminophen, acetaminophen, HYDROmorphone, labetalol, magic mouthwash, midazolam, ondansetron **OR** ondansetron (ZOFRAN) IV, ondansetron (ZOFRAN) IV, sodium chloride flush  Assessment/Plan: Patient Active Problem List   Diagnosis Date Noted  . Mucus plugging of bronchi   . Acute respiratory failure with hypoxemia ([place])   . Ileus ([place])   . Hypokalemia 05/14/2017  . Anastomotic leak of intestine 05/14/2017  . Severe sepsis ([place]) 05/14/2017  . Wound dehiscence, surgical 05/14/2017  . Aspiration pneumonia ([place]) 05/14/2017  . Chronic narcotic use 05/10/2017  . Anxiety state 05/10/2017  . Intra-abdominal adhesions s/p SB resection 05/06/2017 05/09/2017  . H/O total adrenalectomy ([place]) 05/06/2017  . Right adrenal mass s/p adrenalectomy 05/06/2017 05/06/2017  . Throat symptom 03/18/2016  . Multinodular goiter 01/19/2016  . Hyperthyroidism 01/19/2016  . Essential hypertension   . Diarrhea 11/30/2014  . Anemia, iron deficiency 11/01/2014  . Leukocytosis 11/03/2013  . Tobacco abuse 07/26/2013  . COPD (chronic obstructive pulmonary disease) ([place]) 07/26/2013  . Left knee pain 07/26/2013  . Pain in joint, shoulder region 05/03/2013  . GERD 07/24/2008  . 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  Small bowel in discontinuity. Cont NG tube to LIWS.  Cont IV abx  Cont TPN  Updated family at [facility].   Disposition:  LOS: 17 days    Gayland Curry, MD [phone] [facility]

## 2017-05-23 NOTE — Progress Notes (Signed)
[place] NOTE   Pharmacy Consult for TPN Indication: prolonged ileus, small bowel leak  Patient Measurements: Body mass index is 29.92 kg/m. Filed Weights   05/16/17 0500 05/21/17 0555 05/21/17 1111  Weight: 217 lb 13 oz (98.8 kg) 208 lb 8.9 oz (94.6 kg) 208 lb 8.9 oz (94.6 kg)   HPI: 49 yoM admitted on 7/11 for enlarging adrenal mass and planned adrenalectomy.  Pharmacy is now consulted to dose TPN.  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  Planning for return to OR in 48-72h 7/22 OR:  wash out, tube ileostomy, left with open abdomen & wound vac in place.  Plan for OR on 7/25 for closure 7/25 OR: mesh placed for abdominal closure, wound vac  Insulin requirements past 24 hours: 11 units SSI (no hx DM)  Current Nutrition: NPO  IVF: D5 1/2NS at10 ml/hr  Central access: PICC line ordered 7/19, placed on 7/20 TPN start date: 7/20  ASSESSMENT                                                                                                           Today, 05/23/17 - Glucose: within goal < 150 for most part - with change in Clinimix from 5/15 to 5/20 CBGs appear to be trending upward some - range 119-157 - Electrolytes: K and Phos back to WNL. Na dropped slightly to 133 - monitor - Renal:  SCr wnl, stable.  Lasix given 7/25 and 7/26 per PCCM d/t high volume TPN + continuous drips/abx.  I/O net -400 mL yesterday.  7/27 I/O: 3321/4500 net - 1171mL - LFTs: WNL, Tbili WNL - TGs:  296 (7/18), 266 (7/20), 269 (7/23).  Propofol d/c 7/24. - Prealbumin:  <5 (7/20), 6.1  (7/24)  NUTRITIONAL GOALS                                                                                             RD recs (7/24):  160 Protein,  0347-4259 Kcal  Clinimix 5/15 at a max fluid volume of 3L at 125 ml/hr  + 20% fat emulsion 240 ml/day to provide: 150 g of protein and 2610 kCals per day meeting 94% of protein and >100% of kCal needs. - Glucose infusion rate will be 3.2 mg/kg/min (Maximum 5 mg/kg/min) - Using Lipids only 3 times/week on MWF would provide: 150 g  of protein and an average of 2336 kCals per day meeting 94% of protein and 100 % of kCal needs.  7/27: Clinimix 5/20 at rate of 115 ml/hr with lipids 3x/week on MWF only would provide: 138g protein and avg 2174 kcal/day  Based on ASPEN guidelines, while the patient meets ICU status the initiation of lipids is being delayed for 7 days or until transition out of ICU. Planned start date for lipids is 05/22/17.  Goal will be to meet 100% of the patient's protein needs and approximately 80% of their caloric needs. Clinimix 5/15 @ 125 mL/hr to provide 150 grams protein (94% of goal) and 2130 kcal (97% of goal)    PLAN                                                                                                                          At 1800 today:  Continue Clinimix lyte free 5/20 at current rate of 115 ml/hr  Continue IV Lipids MWF  TPN to contain standard multivitamins daily and trace elements only M/W/F.  Continue IVF at [place] and moderate SSI q6h.  TPN lab panels on Mondays & Thursdays.  Monitor I/O with high volume TPN   Adrian Saran, PharmD, BCPS Pager [phone] 05/23/2017 9:00 AM

## 2017-05-23 NOTE — Progress Notes (Signed)
PHARMACY - PHYSICIAN COMMUNICATION CRITICAL VALUE ALERT - BLOOD CULTURE IDENTIFICATION (BCID)  No results found for this or any previous visit. 1/2 Staph Species, MecA gene detected  Name of physician (or Provider) Contacted: Greer Pickerel  Changes to prescribed antibiotics required: none, likely contaminant.  MD to observe fever curve overnight.   Biagio Borg 05/23/2017  2:53 PM

## 2017-05-24 ENCOUNTER — Inpatient Hospital Stay (HOSPITAL_COMMUNITY): Payer: Medicare Other

## 2017-05-24 DIAGNOSIS — J690 Pneumonitis due to inhalation of food and vomit: Secondary | ICD-10-CM

## 2017-05-24 LAB — BLOOD GAS, ARTERIAL
ACID-BASE EXCESS: 3 mmol/L — AB (ref 0.0–2.0)
BICARBONATE: 27.4 mmol/L (ref 20.0–28.0)
Drawn by: 308601
FIO2: 30
LHR: 16 {breaths}/min
O2 SAT: 97.3 %
PATIENT TEMPERATURE: 98.6
PCO2 ART: 44.1 mmHg (ref 32.0–48.0)
PEEP/CPAP: 5 cmH2O
PH ART: 7.41 (ref 7.350–7.450)
PO2 ART: 94.1 mmHg (ref 83.0–108.0)
VT: 580 mL

## 2017-05-24 LAB — CBC
HEMATOCRIT: 25.6 % — AB (ref 39.0–52.0)
HEMOGLOBIN: 8.3 g/dL — AB (ref 13.0–17.0)
MCH: 26.7 pg (ref 26.0–34.0)
MCHC: 32.4 g/dL (ref 30.0–36.0)
MCV: 82.3 fL (ref 78.0–100.0)
Platelets: 792 10*3/uL — ABNORMAL HIGH (ref 150–400)
RBC: 3.11 MIL/uL — AB (ref 4.22–5.81)
RDW: 15.4 % (ref 11.5–15.5)
WBC: 15.6 10*3/uL — ABNORMAL HIGH (ref 4.0–10.5)

## 2017-05-24 LAB — BASIC METABOLIC PANEL
ANION GAP: 9 (ref 5–15)
BUN: 19 mg/dL (ref 6–20)
CHLORIDE: 96 mmol/L — AB (ref 101–111)
CO2: 27 mmol/L (ref 22–32)
Calcium: 7.7 mg/dL — ABNORMAL LOW (ref 8.9–10.3)
Creatinine, Ser: 0.78 mg/dL (ref 0.61–1.24)
GFR calc non Af Amer: >60 mL/min (ref >60)
GLUCOSE: 143 mg/dL — AB (ref 65–99)
POTASSIUM: 3.8 mmol/L (ref 3.5–5.1)
Sodium: 132 mmol/L — ABNORMAL LOW (ref 135–145)

## 2017-05-24 LAB — PHOSPHORUS: PHOSPHORUS: 3.9 mg/dL (ref 2.5–4.6)

## 2017-05-24 LAB — MAGNESIUM: MAGNESIUM: 1.4 mg/dL — AB (ref 1.7–2.4)

## 2017-05-24 LAB — GLUCOSE, CAPILLARY
GLUCOSE-CAPILLARY: 138 mg/dL — AB (ref 65–99)
Glucose-Capillary: 150 mg/dL — ABNORMAL HIGH (ref 65–99)
Glucose-Capillary: 156 mg/dL — ABNORMAL HIGH (ref 65–99)

## 2017-05-24 LAB — TRIGLYCERIDES: Triglycerides: 210 mg/dL — ABNORMAL HIGH (ref <150)

## 2017-05-24 MED ORDER — MAGNESIUM SULFATE 2 GM/50ML IV SOLN
2.0000 g | Freq: Once | INTRAVENOUS | Status: AC
  Administered 2017-05-24: 2 g via INTRAVENOUS
  Filled 2017-05-24: qty 50

## 2017-05-24 MED ORDER — FUROSEMIDE 10 MG/ML IJ SOLN
5.0000 mg/h | INTRAMUSCULAR | Status: AC
  Filled 2017-05-24: qty 25

## 2017-05-24 MED ORDER — MAGNESIUM SULFATE 4 GM/100ML IV SOLN
4.0000 g | Freq: Once | INTRAVENOUS | Status: AC
  Administered 2017-05-24: 4 g via INTRAVENOUS
  Filled 2017-05-24: qty 100

## 2017-05-24 MED ORDER — M.V.I. ADULT IV INJ
INTRAVENOUS | Status: AC
  Administered 2017-05-24: 17:00:00 via INTRAVENOUS
  Filled 2017-05-24: qty 2000
  Filled 2017-05-24: qty 2760

## 2017-05-24 MED ORDER — POTASSIUM CHLORIDE 20 MEQ/15ML (10%) PO SOLN
40.0000 meq | Freq: Three times a day (TID) | ORAL | Status: AC
  Administered 2017-05-24 (×2): 40 meq
  Filled 2017-05-24 (×2): qty 30

## 2017-05-24 NOTE — Progress Notes (Signed)
Wound Vac continuously alarming and turning off, attempts made to reinforce dressing to solve leak issue, with no resolve. Surgeon on call paged and gave WTA-RN go ahead to change out entire vac dressing. Dressing changed and vac no longer alarming. RN to continue to monitor.

## 2017-05-24 NOTE — Progress Notes (Signed)
[name]  ADMISSION DATE:  05/06/2017  CONSULTATION DATE:  05/13/17  REFERRING MD:  Kieth Brightly  CHIEF COMPLAINT:  Vent Management  Presenting HPI:  56 y.o. 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 On 7/18, he developed a leak therefore was taken back to OR for re-do of ex-lap and repair of leak.  He returned to the ICU on the vent and PCCM was consulted for vent management.  Subjective:  Propofol started overnight due to severe agitation  Vent Mode: PRVC FiO2 (%):  [30 %-40 %] 30 % Set Rate:  [16 bmp] 16 bmp Vt Set:  [580 mL] 580 mL PEEP:  [5 cmH20] 5 cmH20 Plateau Pressure:  [16 cmH20-20 cmH20] 20 cmH20  Temp:  [99.5 F (37.5 C)-101.1 F (38.4 C)] 101.1 F (38.4 C) (07/29 0800) Pulse Rate:  [97-131] 124 (07/29 0900) Resp:  [16-32] 23 (07/29 0900) BP: (96-165)/(48-97) 103/88 (07/29 0900) SpO2:  [91 %-100 %] 99 % (07/29 0900) FiO2 (%):  [30 %-40 %] 30 % (07/29 0815)  General: Ill appearing adult male in NAD on vent, wife at bedside HEENT: MM pink/moist, ETT, no jvd PSY: Sedate on propofol overnight Neuro: Sedate, awakens to stimulation CV: RRR, Nl S1/S2, -M/R/G PULM: Coarse BS diffusely GI: midline catheter x2 with small amt yellow / brown drainage around site, RUQ VAC changed > wound clean Extremities: warm/dry, 1+ generalized edema  Skin: small amt skin irritation from dressing changes on abd  CBC Latest Ref Rng & Units 05/24/2017 05/23/2017 05/22/2017  WBC 4.0 - 10.5 K/uL 15.6(H) 17.3(H) 25.4(H)  Hemoglobin 13.0 - 17.0 g/dL 8.3(L) 7.6(L) 7.3(L)  Hematocrit 39.0 - 52.0 % 25.6(L) 22.7(L) 21.8(L)  Platelets 150 - 400 K/uL 792(H) 714(H) 836(H)   BMP Latest Ref Rng & Units 05/24/2017 05/23/2017 05/22/2017  Glucose 65 - 99 mg/dL 143(H) 147(H) 143(H)  BUN 6 - 20 mg/dL 19  21(H) 28(H)  Creatinine 0.61 - 1.24 mg/dL 0.78 0.72 0.90  Sodium 135 - 145 mmol/L 132(L) 133(L) 135  Potassium 3.5 - 5.1 mmol/L 3.8 4.8 5.4(H)  Chloride 101 - 111 mmol/L 96(L) 101 100(L)  CO2 22 - 32 mmol/L '27 24 27  ' Calcium 8.9 - 10.3 mg/dL 7.7(L) 7.7(L) 8.0(L)   Hepatic Function Latest Ref Rng & Units 05/23/2017 05/21/2017 05/19/2017  Total Protein 6.5 - 8.1 g/dL 5.3(L) 5.2(L) -  Albumin 3.5 - 5.0 g/dL 1.5(L) 1.5(L) 1.4(L)  AST 15 - 41 U/L 25 26 -  ALT 17 - 63 U/L 13(L) 19 -  Alk Phosphatase 38 - 126 U/L 67 76 -  Total Bilirubin 0.3 - 1.2 mg/dL 0.4 1.0 -   IMAGING/STUDIES: CT ABD/PELVIS W/ CONTRAST 7/18: IMPRESSION: 1. Diffuse dilatation of the small greater the large bowel with fluid levels throughout the small and large bowel, most compatible with adynamic ileus in the postoperative state. No bowel wall thickening. No discrete small bowel caliber transition to suggest obstruction. 2. Pneumoperitoneum in the right upper quadrant is presumably due to recent surgery . 3. Small postoperative fluid collection at the right adrenalectomy site without wall thickening or internal gas. 4. Small dependent right pleural effusion. 5. Patchy bibasilar lung consolidation and ground-glass attenuation with some volume loss, concerning for multilobar pneumonia and/or aspiration with some atelectasis. PORT CXR 7/23: Improving left upper lobe opacity. Enteric feeding tube coursing below diaphragm  and seeming to occur towards the fundus of stomach. Endotracheal tube in good position above the carina. No new focal opacity appreciated. Persistent silhouetting left hemidiaphragm. PORT CXR 7/25: small bilateral pleural effusions VENOUS DUPLEX 7/25 >> preliminary negative for DVT, superficial vein thrombosis in the cephalic vein  MICROBIOLOGY: MRSA PCR 7/11:  Negative Abdominal Wound Culture 7/18:  Klebsiella pneumoniae (resistant to Ampicillin) Blood Cultures x2 7/19: negative  Tracheal Aspirate Culture  7/22: multiple organisms present, none predominant  ANTIBIOTICS: Aztreonam 7/17 (x1 dose) Vancomycin 7/17 - 7/21 Cefeime 7/17 >>> Eraxis 7/19 >>> 7/21, 7/26 >> Flagyl 7/20 >>>  LINES/TUBES: OETT 07/18 >>>  RUE PICC 7/20 >>> RAD ART LINE 7/18 >>> 7/23 Foley 7/28 >>>  SIGNIFICANT EVENTS: 07/11 - Admit for laparoscopic converted to open right adrenal ectomy w/  Small bowel resection and reanastamosis. Wound vac placed. 07/18 - Found to have leak >> taken back to OR for re-do exploratory laparotomy. Returned to ICU on vent. 07/19 - NGT pulled out and almost self-extubated. Developed cuff leak >> tube exchanged. Back to OR emergently for intestinal leak/ischemic bowel. 07/22 - Back to OR for wash out >> left with open abdomen & wound vac in place. Transfused 1u PRBC. 07/23 - Intermittent agitation, sedation adjusted  07/25 - rising temp, WBC increased. Returned to OR for closure 07/27 - Recultured, sedation down on precedex  ASSESSMENT/PLAN:  56 y.o. male admitted with adrenal mass positive for metanephrine's s/p right adrenalectomy complicated by small bowel injury in the setting of adhesions with sepsis secondary to intra-abdominal infection. Patient with persistent hypoxic respiratory failure.  Sepsis - in the setting of intra-abdominal infection with anastomotic leak resulting in peritonitis.   Fever - intermittent, ongoing.   P: Continue antibiotics & antifungals as above Repeat cultures  Plan 14 days of antibiotics/antifungals  Acute hypoxic respiratory failure: in setting of sepsis, post-op adrenalectomy. P:  Continue full vent support Hold weaning for now Diureses as ordered Daily SBT / WUA  Duoneb Q6  Intermittent CXR  VAP prevention measures Anticipate will need trach given sedation needs, weaning well however.  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 s/p reanastamosis P: Recommendations per CCS OK from surgical standpoint for extubation once pt meets criteria Reduce NGT suction TPN per pharmacy  Anemia - bloody drainage from NGT  P: Trend CBC  Lovenox for DVT prophylaxis  Transfuse for Hgb <7% Thiamine Minimize lab draws as able    UE Swelling - preliminary [name] results negative  P: Monitor / supportive care Lasix 5 mg/hr x24 hours Replace K  History of hypothyroidism  P: Repeat TSH in 6 weeks (~ 9/1)  Tachyphylaxis - while on propofol / fentanyl  P: Dilaudid drip Versed drip Hold propofol Restart precedex  Hypokalemia  P: Monitor BMP  Replace electrolytes as indicated KVO IVF Lasix 5 mg/hr drip Would like to give zaroxolyn but NPO for now  Hyperglycemia P: CBG with SSI   Protein calorie malnutrition P:  TPN per Pharmacy   Tobacco use disorder P: Nicotine patch  Tobacco cessation counseling once able  Prophylaxis:  SCD's, Lovenox, Protonix    Diet:  NPO. Continue TPN. Code Status:  Full Code   Disposition:  ICU. Remains sedated / intubated  Family Update: No family bedside to updated  The patient is critically ill with multiple organ systems failure and requires high complexity decision making for assessment and support, frequent evaluation and titration of therapies, application of advanced monitoring technologies and extensive interpretation of multiple databases.   Critical Care Time devoted to patient care services described in this note is  35  Minutes. This time reflects time of care of this signee Dr Jennet Maduro. This critical care time does not reflect procedure time, or teaching time or supervisory time of PA/NP/Med student/Med Resident etc but could involve care discussion time.  Rush Farmer, M.D. [facility] Pulmonary/Critical Care Medicine. Pager:  [phone]. After hours pager: [phone].

## 2017-05-24 NOTE — Progress Notes (Signed)
[facility] NOTE   Pharmacy Consult for TPN Indication: prolonged ileus, small bowel leak, multiple bowel surgeries  Patient Measurements: Body mass index is 29.92 kg/m. Filed Weights   05/16/17 0500 05/21/17 0555 05/21/17 1111  Weight: 217 lb 13 oz (98.8 kg) 208 lb 8.9 oz (94.6 kg) 208 lb 8.9 oz (94.6 kg)   HPI: 69 yoM admitted on 7/11 for enlarging adrenal mass and planned adrenalectomy.  Pharmacy is now consulted to dose TPN.  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  Planning for return to OR in 48-72h 7/22 OR:  wash out, tube ileostomy, left with open abdomen & wound vac in place.  Plan for OR on 7/25 for closure 7/25 OR: mesh placed for abdominal closure, wound vac  Insulin requirements past 24 hours: 11 units SSI (no hx DM)  Current Nutrition: NPO  IVF: D5 1/2NS at10 ml/hr  Central access: PICC line ordered 7/19, placed on 7/20 TPN start date: 7/20  ASSESSMENT                                                                                                           Today, 05/24/17 - Glucose: within goal < 150 for most part - with change in Clinimix from 5/15 to 5/20 CBGs appear to be trending upward some - range 131-157 - Electrolytes: WNL except Mag just below normal range and Na still slightly low at 132 - Renal:  SCr wnl, stable.  Lasix given 7/25 and 7/26 per PCCM d/t high volume TPN + continuous drips/abx. 7/28 I/O: 3607/5829 net -2218mL - LFTs: WNL, Tbili WNL - TGs:  296 (7/18), 266 (7/20), 269 (7/23).  Propofol d/c 7/24. 210 (7/28) - propofol restarted 7/28 PM  but then stopped again 7/29 AM - Prealbumin:  <5 (7/20), 6.1 (7/24)  NUTRITIONAL GOALS                                                                                             RD recs (7/24):  160 Protein,  7616-0737 Kcal  Clinimix 5/15 at a max fluid volume of 3L at 125 ml/hr  + 20% fat emulsion 240 ml/day to provide: 150 g of protein and 2610 kCals per day meeting 94% of protein and >100% of kCal needs. - Glucose infusion rate will be 3.2 mg/kg/min (Maximum 5 mg/kg/min) - Using Lipids  only 3 times/week on MWF would provide: 150 g of protein and an average of 2336 kCals per day meeting 94% of protein and 100 % of kCal needs.  7/27: Clinimix 5/20 at rate of 115 ml/hr with lipids 3x/week on MWF only would provide: 138g protein and avg 2174 kcal/day  Based on ASPEN guidelines, while the patient meets ICU status the initiation of lipids is being delayed for 7 days or until transition out of ICU. Planned start date for lipids is 05/22/17.  Goal will be to meet 100% of the patient's protein needs and approximately 80% of their caloric needs. Clinimix 5/15 @ 125 mL/hr to provide 150 grams protein (94% of goal) and 2130 kcal (97% of goal)    PLAN                                                                                                                          Mag 2g IV x 1 for low mag level  At 1800 today:  Continue Clinimix lyte free 5/20 at current rate of 115 ml/hr  Continue IV Lipids MWF  TPN to contain standard multivitamins daily and trace elements only MWF  Continue IVF at [place] and moderate SSI q6h.  TPN lab panels on Mondays & Thursdays.  Monitor I/O with high volume TPN   Adrian Saran, PharmD, BCPS Pager [phone] 05/24/2017 7:45 AM

## 2017-05-24 NOTE — Progress Notes (Signed)
Patient ID: DONAVEN CRISWELL, male   DOB: [birth year], 56 y.o.   MRN: [id number]   Acute Care Surgery Service Progress Note:    Chief Complaint/Subjective: reqd some increased sedation last night but propofol now off. Had some mucous plugging per wife. Wound vac alerted several times and nurse had to reinforce - no leak but pressure only able to get up to 141mHg.   1/2 blood cx grew coag neg staph  Objective: Vital signs in last 24 hours: Temp:  [99.4 F (37.4 C)-100.9 F (38.3 C)] 99.5 F (37.5 C) (07/29 0530) Pulse Rate:  [95-131] 107 (07/29 0700) Resp:  [16-32] 26 (07/29 0700) BP: (96-165)/(48-97) 147/72 (07/29 0700) SpO2:  [91 %-100 %] 100 % (07/29 0700) FiO2 (%):  [30 %-40 %] 30 % (07/29 0600) Last BM Date: 05/19/17  Intake/Output from previous day: 07/28 0701 - 07/29 0700 In: 3607.2 [I.V.:3157.2; IV Piggyback:450] Out: 55009[[FGHWE:9937 Emesis/NG output:1250; Drains:655] Intake/Output this shift: No intake/output data recorded.  Lungs: cta, some coarse bs at bases, nonlabored  Cardiovascular: reg  Abd: wound vac with slight leak around tubing. Drains unchanged  Extremities: no edema, +SCDs  Neuro: intubated, sedated  Lab Results: CBC   Recent Labs  05/23/17 0345 05/24/17 0333  WBC 17.3* 15.6*  HGB 7.6* 8.3*  HCT 22.7* 25.6*  PLT 714* 792*   BMET  Recent Labs  05/23/17 0345 05/24/17 0333  NA 133* 132*  K 4.8 3.8  CL 101 96*  CO2 24 27  GLUCOSE 147* 143*  BUN 21* 19  CREATININE 0.72 0.78  CALCIUM 7.7* 7.7*   LFT Hepatic Function Latest Ref Rng & Units 05/23/2017 05/21/2017 05/19/2017  Total Protein 6.5 - 8.1 g/dL 5.3(L) 5.2(L) -  Albumin 3.5 - 5.0 g/dL 1.5(L) 1.5(L) 1.4(L)  AST 15 - 41 U/L 25 26 -  ALT 17 - 63 U/L 13(L) 19 -  Alk Phosphatase 38 - 126 U/L 67 76 -  Total Bilirubin 0.3 - 1.2 mg/dL 0.4 1.0 -   PT/INR No results for input(s): LABPROT, INR in the last 72 hours. ABG  Recent Labs  05/24/17 0318  PHART 7.410  HCO3 27.4     Studies/Results:  Anti-infectives: Anti-infectives    Start     Dose/Rate Route Frequency Ordered Stop   05/22/17 1400  anidulafungin (ERAXIS) 100 mg in sodium chloride 0.9 % 100 mL IVPB     100 mg 78 mL/hr over 100 Minutes Intravenous Every 24 hours 05/21/17 1330     05/21/17 1400  anidulafungin (ERAXIS) 200 mg in sodium chloride 0.9 % 200 mL IVPB     200 mg 78 mL/hr over 200 Minutes Intravenous  Once 05/21/17 1330 05/21/17 1940   05/15/17 1000  anidulafungin (ERAXIS) 100 mg in sodium chloride 0.9 % 100 mL IVPB  Status:  Discontinued     100 mg 78 mL/hr over 100 Minutes Intravenous Every 24 hours 05/14/17 0829 05/16/17 0901   05/15/17 1000  metroNIDAZOLE (FLAGYL) IVPB 500 mg     500 mg 100 mL/hr over 60 Minutes Intravenous Every 8 hours 05/15/17 0903     05/14/17 0900  anidulafungin (ERAXIS) 200 mg in sodium chloride 0.9 % 200 mL IVPB     200 mg 78 mL/hr over 200 Minutes Intravenous  Once 05/14/17 0829 05/14/17 1325   05/13/17 1927  vancomycin (VANCOCIN) 1-5 GM/200ML-% IVPB    Comments:  Ward, Christa   : cabinet override      05/13/17 1927 05/14/17 0744   05/12/17 1400  ceFEPIme (MAXIPIME) 1 g in dextrose 5 % 50 mL IVPB     1 g 100 mL/hr over 30 Minutes Intravenous Every 8 hours 05/12/17 0939     05/12/17 0900  vancomycin (VANCOCIN) IVPB 1000 mg/200 mL premix  Status:  Discontinued     1,000 mg 200 mL/hr over 60 Minutes Intravenous Every 12 hours 05/12/17 0750 05/16/17 0852   05/12/17 0830  aztreonam (AZACTAM) 2 GM IVPB     2 g 100 mL/hr over 30 Minutes Intravenous  Once 05/12/17 0800 05/12/17 0957   05/06/17 0916  vancomycin (VANCOCIN) IVPB 1000 mg/200 mL premix     1,000 mg 200 mL/hr over 60 Minutes Intravenous On call to O.R. 05/06/17 0916 05/06/17 1229      Medications: Scheduled Meds: . bisacodyl  10 mg Rectal Daily  . chlorhexidine gluconate (MEDLINE KIT)  15 mL Mouth Rinse BID  . Chlorhexidine Gluconate Cloth  6 each Topical Daily  . Chlorhexidine Gluconate  Cloth  6 each Topical Q0600  . enoxaparin (LOVENOX) injection  40 mg Subcutaneous Q24H  . insulin aspart  0-15 Units Subcutaneous Q6H  . ipratropium-albuterol  3 mL Nebulization Q6H  . lip balm  1 application Topical BID  . mouth rinse  15 mL Mouth Rinse QID  . nicotine  21 mg Transdermal Daily  . pantoprazole (PROTONIX) IV  40 mg Intravenous Q24H  . thiamine injection  100 mg Intravenous Daily   Continuous Infusions: . anidulafungin Stopped (05/23/17 1641)  . ceFEPime (MAXIPIME) IV Stopped (05/24/17 0533)  . dexmedetomidine (PRECEDEX) IV infusion Stopped (05/22/17 1200)  . dextrose 5 % and 0.45% NaCl    . furosemide (LASIX) infusion 5 mg/hr (05/23/17 1800)  . HYDROmorphone 6 mg/hr (05/24/17 0549)  . magnesium sulfate 1 - 4 g bolus IVPB    . metronidazole Stopped (05/24/17 0313)  . midazolam (VERSED) infusion 10 mg/hr (05/24/17 0700)  . propofol (DIPRIVAN) infusion Stopped (05/24/17 0550)  . TPN (CLINIMIX) Adult without lytes 115 mL/hr at 05/23/17 1800  . TPN (CLINIMIX) Adult without lytes     PRN Meds:.acetaminophen, acetaminophen, HYDROmorphone, labetalol, magic mouthwash, midazolam, ondansetron **OR** ondansetron (ZOFRAN) IV, ondansetron (ZOFRAN) IV, sodium chloride flush  Assessment/Plan: Patient Active Problem List   Diagnosis Date Noted  . Mucus plugging of bronchi   . Acute respiratory failure with hypoxemia ([place])   . Ileus ([place])   . Hypokalemia 05/14/2017  . Anastomotic leak of intestine 05/14/2017  . Severe sepsis ([place]) 05/14/2017  . Wound dehiscence, surgical 05/14/2017  . Aspiration pneumonia ([place]) 05/14/2017  . Chronic narcotic use 05/10/2017  . Anxiety state 05/10/2017  . Intra-abdominal adhesions s/p SB resection 05/06/2017 05/09/2017  . 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 Right adrenal mass s/p adrenalectomy 05/06/2017 05/06/2017  . Throat symptom 03/18/2016  . Multinodular goiter 01/19/2016  . Hyperthyroidism 01/19/2016  . Essential hypertension   .  Diarrhea 11/30/2014  . Anemia, iron deficiency 11/01/2014  . Leukocytosis 11/03/2013  . Tobacco abuse 07/26/2013  . COPD (chronic obstructive pulmonary disease) ([place]) 07/26/2013  . Left knee pain 07/26/2013  . Pain in joint, shoulder region 05/03/2013  . GERD 07/24/2008  . 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  Small bowel in discontinuity. Cont NG tube to LIWS.  Cont IV abx  Cont TPN  Believe the coag  staph 1/2 blood cx is a contaminant but will defer to CCM.  Wound vac change on Monday. It does not have a current leak but just maintaining pressure of 161mHg which is acceptable. Will try to reinforce.     LOS: 18 days    ELeighton Ruff WRedmond Pulling MD, FACS General, Bariatric, & Minimally Invasive Surgery [phone]

## 2017-05-24 NOTE — Progress Notes (Signed)
After referral, I met pt's sister in the hallway and visited pt w/her. Pt's wife was bedside but was asleep during the visit. Pt's nephew and his SO were also bedside w/sister. Pt's sister requested prayer which we had bedside of pt who was intubated. Pt's sister described him as sedated. Pt's wife continued to rest during our visit. Pt's sister and family were grateful for prayer and support. Please page if additional support is needed. [place], [place]   05/24/17 1700  Clinical Encounter Type  Visited With Family

## 2017-05-25 ENCOUNTER — Inpatient Hospital Stay (HOSPITAL_COMMUNITY): Payer: Medicare Other

## 2017-05-25 DIAGNOSIS — J15212 Pneumonia due to Methicillin resistant Staphylococcus aureus: Secondary | ICD-10-CM

## 2017-05-25 LAB — BLOOD GAS, ARTERIAL
ACID-BASE EXCESS: 5 mmol/L — AB (ref 0.0–2.0)
Bicarbonate: 29.6 mmol/L — ABNORMAL HIGH (ref 20.0–28.0)
DRAWN BY: 308601
FIO2: 50
O2 SAT: 97.2 %
PATIENT TEMPERATURE: 98.6
PCO2 ART: 46.1 mmHg (ref 32.0–48.0)
PEEP/CPAP: 5 cmH2O
PH ART: 7.424 (ref 7.350–7.450)
RATE: 16 resp/min
VT: 580 mL
pO2, Arterial: 98.2 mmHg (ref 83.0–108.0)

## 2017-05-25 LAB — DIFFERENTIAL
Basophils Absolute: 0 10*3/uL (ref 0.0–0.1)
Basophils Relative: 0 %
EOS PCT: 2 %
Eosinophils Absolute: 0.3 10*3/uL (ref 0.0–0.7)
LYMPHS ABS: 2.3 10*3/uL (ref 0.7–4.0)
LYMPHS PCT: 16 %
MONO ABS: 1.3 10*3/uL — AB (ref 0.1–1.0)
Monocytes Relative: 9 %
NEUTROS ABS: 10.6 10*3/uL — AB (ref 1.7–7.7)
NEUTROS PCT: 73 %

## 2017-05-25 LAB — GLUCOSE, CAPILLARY
Glucose-Capillary: 130 mg/dL — ABNORMAL HIGH (ref 65–99)
Glucose-Capillary: 139 mg/dL — ABNORMAL HIGH (ref 65–99)
Glucose-Capillary: 140 mg/dL — ABNORMAL HIGH (ref 65–99)
Glucose-Capillary: 149 mg/dL — ABNORMAL HIGH (ref 65–99)
Glucose-Capillary: 153 mg/dL — ABNORMAL HIGH (ref 65–99)

## 2017-05-25 LAB — CBC
HCT: 22.5 % — ABNORMAL LOW (ref 39.0–52.0)
Hemoglobin: 7.4 g/dL — ABNORMAL LOW (ref 13.0–17.0)
MCH: 27.1 pg (ref 26.0–34.0)
MCHC: 32.9 g/dL (ref 30.0–36.0)
MCV: 82.4 fL (ref 78.0–100.0)
PLATELETS: 857 10*3/uL — AB (ref 150–400)
RBC: 2.73 MIL/uL — AB (ref 4.22–5.81)
RDW: 15.8 % — ABNORMAL HIGH (ref 11.5–15.5)
WBC: 14.5 10*3/uL — AB (ref 4.0–10.5)

## 2017-05-25 LAB — COMPREHENSIVE METABOLIC PANEL
ALBUMIN: 1.6 g/dL — AB (ref 3.5–5.0)
ALT: 12 U/L — AB (ref 17–63)
AST: 20 U/L (ref 15–41)
Alkaline Phosphatase: 80 U/L (ref 38–126)
Anion gap: 8 (ref 5–15)
BUN: 19 mg/dL (ref 6–20)
CHLORIDE: 94 mmol/L — AB (ref 101–111)
CO2: 29 mmol/L (ref 22–32)
CREATININE: 0.84 mg/dL (ref 0.61–1.24)
Calcium: 7.6 mg/dL — ABNORMAL LOW (ref 8.9–10.3)
GFR calc Af Amer: >60 mL/min (ref >60)
Glucose, Bld: 151 mg/dL — ABNORMAL HIGH (ref 65–99)
POTASSIUM: 3.4 mmol/L — AB (ref 3.5–5.1)
Sodium: 131 mmol/L — ABNORMAL LOW (ref 135–145)
Total Bilirubin: 0.3 mg/dL (ref 0.3–1.2)
Total Protein: 6.1 g/dL — ABNORMAL LOW (ref 6.5–8.1)

## 2017-05-25 LAB — CULTURE, BLOOD (ROUTINE X 2): SPECIAL REQUESTS: ADEQUATE

## 2017-05-25 LAB — PHOSPHORUS: PHOSPHORUS: 2.7 mg/dL (ref 2.5–4.6)

## 2017-05-25 LAB — CULTURE, RESPIRATORY W GRAM STAIN

## 2017-05-25 LAB — CULTURE, RESPIRATORY

## 2017-05-25 LAB — MAGNESIUM: MAGNESIUM: 1.9 mg/dL (ref 1.7–2.4)

## 2017-05-25 LAB — PREALBUMIN: PREALBUMIN: 10.1 mg/dL — AB (ref 18–38)

## 2017-05-25 MED ORDER — VANCOMYCIN HCL 10 G IV SOLR
1250.0000 mg | Freq: Two times a day (BID) | INTRAVENOUS | Status: DC
  Administered 2017-05-25 (×2): 1250 mg via INTRAVENOUS
  Filled 2017-05-25 (×3): qty 1250

## 2017-05-25 MED ORDER — POTASSIUM CHLORIDE 10 MEQ/50ML IV SOLN
10.0000 meq | INTRAVENOUS | Status: AC
  Administered 2017-05-25 (×3): 10 meq via INTRAVENOUS
  Filled 2017-05-25 (×3): qty 50

## 2017-05-25 MED ORDER — TRACE MINERALS CR-CU-MN-SE-ZN 10-1000-500-60 MCG/ML IV SOLN
INTRAVENOUS | Status: AC
  Administered 2017-05-25: 18:00:00 via INTRAVENOUS
  Filled 2017-05-25 (×2): qty 2880

## 2017-05-25 MED ORDER — FAT EMULSION 20 % IV EMUL
240.0000 mL | INTRAVENOUS | Status: AC
  Administered 2017-05-25: 240 mL via INTRAVENOUS
  Filled 2017-05-25: qty 250

## 2017-05-25 MED ORDER — FUROSEMIDE 10 MG/ML IJ SOLN
5.0000 mg/h | INTRAVENOUS | Status: DC
  Administered 2017-05-25: 5 mg/h via INTRAVENOUS
  Filled 2017-05-25: qty 25

## 2017-05-25 MED ORDER — CHLORHEXIDINE GLUCONATE CLOTH 2 % EX PADS
6.0000 | MEDICATED_PAD | Freq: Every day | CUTANEOUS | Status: DC
  Administered 2017-05-26 – 2017-05-29 (×4): 6 via TOPICAL

## 2017-05-25 NOTE — Progress Notes (Signed)
Progress Note: General Surgery Service   Assessment/Plan: Patient Active Problem List   Diagnosis Date Noted  . Mucus plugging of bronchi   . Acute respiratory failure with hypoxemia ([place])   . Ileus ([place])   . Hypokalemia 05/14/2017  . Anastomotic leak of intestine 05/14/2017  . Severe sepsis ([place]) 05/14/2017  . Wound dehiscence, surgical 05/14/2017  . Aspiration pneumonia ([place]) 05/14/2017  . Chronic narcotic use 05/10/2017  . Anxiety state 05/10/2017  . Intra-abdominal adhesions s/p SB resection 05/06/2017 05/09/2017  . H/O total adrenalectomy ([place]) 05/06/2017  . Right adrenal mass s/p adrenalectomy 05/06/2017 05/06/2017  . Throat symptom 03/18/2016  . Multinodular goiter 01/19/2016  . Hyperthyroidism 01/19/2016  . Essential hypertension   . Diarrhea 11/30/2014  . Anemia, iron deficiency 11/01/2014  . Leukocytosis 11/03/2013  . Tobacco abuse 07/26/2013  . COPD (chronic obstructive pulmonary disease) ([place]) 07/26/2013  . Left knee pain 07/26/2013  . Pain in joint, shoulder region 05/03/2013  . GERD 07/24/2008  . 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   s/p Procedure(s): ABDOMINAL WOUND [place] OUT, WOUND CLOSURE AND WOUND VAC PLACEMENT 05/20/2017  -continue abx -vac change Wednesday -continue to wean -briefly discussed epidural with family, at this time we will hold off   LOS: 19 days  Chief Complaint/Subjective: Difficulty getting pain under control with weaning  Objective: Vital signs in last 24 hours: Temp:  [99 F (37.2 C)-100.8 F (38.2 C)] 100.4 F (38 C) (07/30 0800) Pulse Rate:  [97-123] 113 (07/30 1230) Resp:  [15-35] 22 (07/30 1230) BP: (82-188)/(53-91) 131/68 (07/30 1230) SpO2:  [88 %-100 %] 99 % (07/30 1230) FiO2 (%):  [30 %-50 %] 40 % (07/30 1205) Last BM Date: 05/24/17 (ileal tubes)  Intake/Output from previous day: 07/29 0701 - 07/30 0700 In: 3786.4 [I.V.:3176.4; IV Piggyback:610] Out: 3290 [Urine:2550; Drains:740] Intake/Output this  shift: Total I/O In: 1167.9 [I.V.:817.9; IV Piggyback:350] Out: -   Lungs: rhonchus b/l  Cardiovascular: tachycardic  Abd: soft, vac in place no erythema, drains with small amount thick liquid  Extremities: no edema  Neuro: GCS 6t  Lab Results: CBC   Recent Labs  05/24/17 0333 05/25/17 0509  WBC 15.6* 14.5*  HGB 8.3* 7.4*  HCT 25.6* 22.5*  PLT 792* 857*   BMET  Recent Labs  05/24/17 0333 05/25/17 0509  NA 132* 131*  K 3.8 3.4*  CL 96* 94*  CO2 27 29  GLUCOSE 143* 151*  BUN 19 19  CREATININE 0.78 0.84  CALCIUM 7.7* 7.6*   PT/INR No results for input(s): LABPROT, INR in the last 72 hours. ABG  Recent Labs  05/24/17 0318 05/25/17 0300  PHART 7.410 7.424  HCO3 27.4 29.6*    Studies/Results:  Anti-infectives: Anti-infectives    Start     Dose/Rate Route Frequency Ordered Stop   05/25/17 1100  vancomycin (VANCOCIN) 1,250 mg in sodium chloride 0.9 % 250 mL IVPB     1,250 mg 166.7 mL/hr over 90 Minutes Intravenous Every 12 hours 05/25/17 0955     05/22/17 1400  anidulafungin (ERAXIS) 100 mg in sodium chloride 0.9 % 100 mL IVPB     100 mg 78 mL/hr over 100 Minutes Intravenous Every 24 hours 05/21/17 1330     05/21/17 1400  anidulafungin (ERAXIS) 200 mg in sodium chloride 0.9 % 200 mL IVPB     200 mg 78 mL/hr over 200 Minutes Intravenous  Once 05/21/17 1330 05/21/17 1940   05/15/17 1000  anidulafungin (ERAXIS) 100 mg in sodium chloride 0.9 % 100  mL IVPB  Status:  Discontinued     100 mg 78 mL/hr over 100 Minutes Intravenous Every 24 hours 05/14/17 0829 05/16/17 0901   05/15/17 1000  metroNIDAZOLE (FLAGYL) IVPB 500 mg  Status:  Discontinued     500 mg 100 mL/hr over 60 Minutes Intravenous Every 8 hours 05/15/17 0903 05/25/17 0938   05/14/17 0900  anidulafungin (ERAXIS) 200 mg in sodium chloride 0.9 % 200 mL IVPB     200 mg 78 mL/hr over 200 Minutes Intravenous  Once 05/14/17 0829 05/14/17 1325   05/13/17 1927  vancomycin (VANCOCIN) 1-5 GM/200ML-% IVPB     Comments:  Ward, Christa   : cabinet override      05/13/17 1927 05/14/17 0744   05/12/17 1400  ceFEPIme (MAXIPIME) 1 g in dextrose 5 % 50 mL IVPB     1 g 100 mL/hr over 30 Minutes Intravenous Every 8 hours 05/12/17 0939 05/26/17 2359   05/12/17 0900  vancomycin (VANCOCIN) IVPB 1000 mg/200 mL premix  Status:  Discontinued     1,000 mg 200 mL/hr over 60 Minutes Intravenous Every 12 hours 05/12/17 0750 05/16/17 0852   05/12/17 0830  aztreonam (AZACTAM) 2 GM IVPB     2 g 100 mL/hr over 30 Minutes Intravenous  Once 05/12/17 0800 05/12/17 0957   05/06/17 0916  vancomycin (VANCOCIN) IVPB 1000 mg/200 mL premix     1,000 mg 200 mL/hr over 60 Minutes Intravenous On call to O.R. 05/06/17 0916 05/06/17 1229      Medications: Scheduled Meds: . bisacodyl  10 mg Rectal Daily  . chlorhexidine gluconate (MEDLINE KIT)  15 mL Mouth Rinse BID  . Chlorhexidine Gluconate Cloth  6 each Topical Q0600  . [START ON 05/26/2017] Chlorhexidine Gluconate Cloth  6 each Topical Q0600  . enoxaparin (LOVENOX) injection  40 mg Subcutaneous Q24H  . insulin aspart  0-15 Units Subcutaneous Q6H  . ipratropium-albuterol  3 mL Nebulization Q6H  . lip balm  1 application Topical BID  . mouth rinse  15 mL Mouth Rinse QID  . nicotine  21 mg Transdermal Daily  . pantoprazole (PROTONIX) IV  40 mg Intravenous Q24H  . thiamine injection  100 mg Intravenous Daily   Continuous Infusions: . [place] KitchenTPN (CLINIMIX-E) Adult    . anidulafungin Stopped (05/24/17 1736)  . ceFEPime (MAXIPIME) IV Stopped (05/25/17 0545)  . dextrose 5 % and 0.45% NaCl    . fat emulsion    . furosemide (LASIX) infusion 5 mg/hr (05/25/17 1200)  . HYDROmorphone 6 mg/hr (05/25/17 1202)  . midazolam (VERSED) infusion 4 mg/hr (05/25/17 1228)  . potassium chloride Stopped (05/25/17 1059)  . propofol (DIPRIVAN) infusion 7 mcg/kg/min (05/25/17 1243)  . TPN (CLINIMIX) Adult without lytes 115 mL/hr at 05/25/17 1200  . vancomycin Stopped (05/25/17 1240)   PRN  Meds:.acetaminophen, HYDROmorphone, labetalol, magic mouthwash, midazolam, ondansetron **OR** ondansetron (ZOFRAN) IV, ondansetron (ZOFRAN) IV, sodium chloride flush  Mickeal Skinner, MD Pg# [phone] [facility]

## 2017-05-25 NOTE — Progress Notes (Signed)
[facility] Pulmonary & Critical Care Note  ADMISSION DATE:  05/06/2017  CONSULTATION DATE:  05/13/17  REFERRING MD:  Kieth Brightly  CHIEF COMPLAINT:  Vent Management  Presenting HPI:  56 y.o. 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 On 7/18, he developed a leak therefore was taken back to OR for re-do of ex-lap and repair of leak.  He returned to the ICU on the vent and PCCM was consulted for vent management.  Subjective:  Tmax in last 24 hours 101.1.  Pt remains sedate on vent.  Dilaudid increased to 236m/hr, Versed 2336m lasix gtt at 36m60mpropofol 5 mcg.  I/O negative 2.2 in last 24 hours / remains in positive balance.    Vent Mode: PRVC FiO2 (%):  [30 %-50 %] 40 % Set Rate:  [16 bmp] 16 bmp Vt Set:  [580 mL] 580 mL PEEP:  [5 cmH20] 5 cmH20 Plateau Pressure:  [16 cmH20-20 cmH20] 20 cmH20  Temp:  [99 F (37.2 C)-100.8 F (38.2 C)] 99 F (37.2 C) (07/30 0400) Pulse Rate:  [97-124] 107 (07/30 0800) Resp:  [15-35] 19 (07/30 0800) BP: (77-131)/(50-88) 116/73 (07/30 0800) SpO2:  [88 %-100 %] 100 % (07/30 0800) FiO2 (%):  [30 %-50 %] 40 % (07/30 0302)  General: ill appearing male in NAD on vent HEENT: MM pink/moist, no jvd Neuro: sedate, no distress on vent  CV: s1s2 rrr, no m/r/g PULM: even/non-labored, lungs bilaterally with rhonchi  GI:GG:YIRSon-tender, bsx4 active  Extremities: warm/dry, reduced edema / trace to 1+  Skin: no rashes or lesions   CBC Latest Ref Rng & Units 05/25/2017 05/24/2017 05/23/2017  WBC 4.0 - 10.5 K/uL 14.5(H) 15.6(H) 17.3(H)  Hemoglobin 13.0 - 17.0 g/dL 7.4(L) 8.3(L) 7.6(L)  Hematocrit 39.0 - 52.0 % 22.5(L) 25.6(L) 22.7(L)  Platelets 150 - 400 K/uL 857(H) 792(H) 714(H)   BMP Latest Ref Rng & Units 05/25/2017 05/24/2017 05/23/2017  Glucose 65 - 99 mg/dL 151(H) 143(H) 147(H)  BUN 6  - 20 mg/dL 19 19 21(H)  Creatinine 0.61 - 1.24 mg/dL 0.84 0.78 0.72  Sodium 135 - 145 mmol/L 131(L) 132(L) 133(L)  Potassium 3.5 - 5.1 mmol/L 3.4(L) 3.8 4.8  Chloride 101 - 111 mmol/L 94(L) 96(L) 101  CO2 22 - 32 mmol/L _0 Calcium 8.9 - 10.3 mg/dL 7.6(L) 7.7(L) 7.7(L)   Hepatic Function Latest Ref Rng & Units 05/25/2017 05/23/2017 05/21/2017  Total Protein 6.5 - 8.1 g/dL 6.1(L) 5.3(L) 5.2(L)  Albumin 3.5 - 5.0 g/dL 1.6(L) 1.5(L) 1.5(L)  AST 15 - 41 U/L _1 ALT 17 - 63 U/L 12(L) 13(L) 19  Alk Phosphatase 38 - 126 U/L 80 67 76  Total Bilirubin 0.3 - 1.2 mg/dL 0.3 0.4 1.0    IMAGING/STUDIES: CT ABD/PELVIS W/ CONTRAST 7/18: IMPRESSION: 1. Diffuse dilatation of the small greater the large bowel with fluid levels throughout the small and large bowel, most compatible with adynamic ileus in the postoperative state. No bowel wall thickening. No discrete small bowel caliber transition to suggest obstruction. 2. Pneumoperitoneum in the right upper quadrant is presumably due to recent surgery . 3. Small postoperative fluid collection at the right adrenalectomy site without wall thickening or internal gas. 4. Small dependent right pleural effusion. 5. Patchy bibasilar lung consolidation and ground-glass attenuation with some volume loss, concerning for multilobar pneumonia and/or aspiration with some  atelectasis. PORT CXR 7/23: Improving left upper lobe opacity. Enteric feeding tube coursing below diaphragm and seeming to occur towards the fundus of stomach. Endotracheal tube in good position above the carina. No new focal opacity appreciated. Persistent silhouetting left hemidiaphragm. PORT CXR 7/25: small bilateral pleural effusions VENOUS DUPLEX 7/25 >> preliminary negative for DVT, superficial vein thrombosis in the cephalic vein  MICROBIOLOGY: MRSA PCR 7/11:  Negative Abdominal Wound Culture 7/18:  Klebsiella pneumoniae (resistant to Ampicillin) Blood Cultures x2 7/19: negative   Tracheal Aspirate Culture 7/22: multiple organisms present, none predominant BCx2 7/27 >> 1/2 coag neg staph BAL 7/27 >> rare staph >>  BCID 7/27 >> staph detected, MRSA detected   ANTIBIOTICS: Aztreonam 7/17 (x1 dose) Vancomycin 7/17 - 7/21 Cefeime 7/17 >>> 7/31 (stop date entered) Eraxis 7/19 >>> 7/21, 7/26 >> Flagyl 7/20 >>> 7/30 Vancomycin 7/30 >>  LINES/TUBES: OETT 07/18 >>>  RUE PICC 7/20 >>> RAD ART LINE 7/18 >>> 7/23 Foley 7/28 >>>  SIGNIFICANT EVENTS: 07/11 - Admit for laparoscopic converted to open right adrenal ectomy w/  Small bowel resection and reanastamosis. Wound vac placed. 07/18 - Found to have leak >> taken back to OR for re-do exploratory laparotomy. Returned to ICU on vent. 07/19 - NGT pulled out and almost self-extubated. Developed cuff leak >> tube exchanged. Back to OR emergently for intestinal leak/ischemic bowel. 07/22 - Back to OR for wash out >> left with open abdomen & wound vac in place. Transfused 1u PRBC. 07/23 - Intermittent agitation, sedation adjusted  07/25 - rising temp, WBC increased. Returned to OR for closure 07/27 - Recultured, sedation down on precedex 07/30 - Remains on sedation, VAC leak over the weekend  ASSESSMENT/PLAN:  56 y.o. male admitted with adrenal mass positive for metanephrine's s/p right adrenalectomy complicated by small bowel injury in the setting of adhesions with sepsis secondary to intra-abdominal infection. Patient with persistent hypoxic respiratory failure.  Sepsis - in the setting of intra-abdominal infection with anastomotic leak resulting in peritonitis.   Fever - intermittent, ongoing.   P: Continue cefepime, stop date for 7/31 = 14 days abx Stop flagyl Resume Vanco with staph in sputum until cultures defined Follow cultures as above Monitor WBC trend/fever curve   Acute hypoxic respiratory failure: in setting of sepsis, post-op adrenalectomy. 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 during adrenalectomy s/p reanastamosis P: Recommendations per CCS OK from surgical standpoint for extubation once meets criteria NGT to LIS  TPN per pharmacy    Anemia - bloody drainage from NGT  P: Trend CBC  Lovenox for DVT prophylaxis  Transfuse for Hgb <7% Thiamine  Minimize lab draws as able     UE Swelling - preliminary [name] results negative  P: Lasix gtt 71m/hr > stop time in place Replace K IV   History of hypothyroidism  P: Repeat TSH ~ 9/1  Tachyphylaxis - while on propofol / fentanyl  P: Dilaudid gtt Versed gtt for sedation  Propofol gtt   Hypokalemia  At Risk AKI P: Trend BMP /  urinary output Replace electrolytes as indicated Avoid nephrotoxic agents, ensure adequate renal perfusion Lasix gtt as above    Hyperglycemia P: CBG with SSI    Protein calorie malnutrition P:  TPN per pharmacy    Tobacco use disorder P: Nicotine patch  Tobacco cessation counseling   Prophylaxis:  SCD's, Lovenox, Protonix    Diet:  NPO. Continue TPN. Code Status:  Full Code   Disposition:  ICU. Remains sedated / intubated  Family Update: Wife updated in full 7/30   CC Time: 41 minutes  Noe Gens, NP-C [facility] Pgr: [phone] or if no answer [phone] 05/25/2017, 8:14 AM

## 2017-05-25 NOTE — Progress Notes (Signed)
Nutrition Follow-up  DOCUMENTATION CODES:   Not applicable  INTERVENTION:  - Continue TPN regimen per Pharmacy. - Will continue to monitor for vent status and need to further adjust estimated nutrition-needs.  NUTRITION DIAGNOSIS:   Inadequate oral intake related to inability to eat as evidenced by NPO status. -ongoing  GOAL:   Patient will meet greater than or equal to 90% of their needs -met with TPN regimen.   MONITOR:   Vent status, Weight trends, Labs, Skin, I & O's, Other (Comment) (TPN regimen)  ASSESSMENT:   56 y.o. 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 On 7/18, he developed a leak therefore was taken back to OR for re-do of ex-lap and repair of leak.  7/30 Pt continues with NGT and remains intubated. Estimated kcal need updated based on medical course. Pt had bronch on 7/27 afternoon. Notes indicate wound vac leak over the weekend which has now been resolved. Reviewed PCCM NP note from this AM in detail. CCM team has begun discussion with wife concerning trach placement. RT in the room at [place] visit and attempted vent weaning which was unsuccessful with increased WOB/agitation. Note also indicates acute anasarca which has been improving with addition of Lasix drip. No new weight since 7/26; will request RN re-weigh pt today as time permits.  Spoke with Pharmacist about TPN regimen. Pt current receiving Clinimix 5/20 @ 115 mL/hr which is providing 138 grams of protein (86% estimated protein need) and 1960 kcal (83% estimated kcal need). Plan to switch to Clinimix E 5/15 (add back lytes given current BMP) @ 120 mL/hr (2880 mL/day) with 20% ILE @ 20 mL/hr x12 hours on MWF. This will provide a daily average of 144 grams of protein (90% estimated protein need) and 2251 kcal (95% estimated kcal need).  Pending fluid status, eventual goal for Clinimix E 5/15 @ 125 mL/hr (3000 mL/day) with 20% ILE @ 20 mL/hr x12 hours MWF. This regimen will provide a daily average of 150 grams of protein (94% estimated protein need) and 2336 kcal (99% estimated kcal need).  Patient is currently intubated on ventilator support MV: 17.1 L/min Temp (24hrs), Avg:100.2 F (37.9 C), Min:99 F (37.2 C), Max:100.8 F (38.2 C) Propofol: 1.1 ml/hr (29 kcal from fat) BP: 128/74 and MAP: 87  Medications reviewed; 10 mg rectal Dulcolax/day, sliding scale Novolog, 4 g IV Mg sulfate x1 run yesterday, 40 mg IV Protonix/day, 10 mEq IC KCl x3 runs today, 40 mEq KCl per NGT x2 doses yesterday, 100 mg IV thiamine/day.  Labs reviewed; CBGs: 130 and 139 mg/dL today, Na: 131 mmol/L, K: 3.4 mmol/L, Cl: 94 mmol/L, Ca: 7.6 mg/dL, triglycerides: 210 mg/dL on 7/28  IVF: D5-1/2 NS @ 10 mL/hr (41 kcal from dextrose). Drips: Dilaudid @ 5 mg/hr, Versed @ 1 mg/hr, Propofol @ 2 mcg/kg/min, Lasix @ 5 mg/hr.    7/27 - NGT in place with bloody drainage during night shift with last documented output of 150cc at 0315; ~675cc in canister at this time.  - No additional drainage documented since 0315.  - Spoke with RN at bedside who reports vent weaning attempted this AM but was unsuccessful and pt now back on full vent support.  - Clinimix E 5/15 @ 125 mL/hr. ILE able to be added today.  - Spoke with Pharmacist about pt/TPN. Noted hypernatremia and hyperphosphatemia. -  Plan to switch to electrolyte-free Clinimix 5/15 d/t this.  - Recommend: Clinimix 5/15 @ 120 mL/hr with 20% ILE @ 20 mL/hr x12 hours. This regimen would provide 144 grams of protein (90% estimated need) and 2525 kcal.  - Weight yesterday was -4.2 kg from 7/21 and +2.3 kg from admission.  - Continue to use UBW of 89.4 kg in estimating needs.   Patient is currently intubated on ventilator support MV: 12.9 L/min Temp (24hrs), Avg:100.1 F (37.8 C), Min:98.4 F (36.9 C),  Max:101.5 F (38.6 C) BP: 126/79 and MAP: 89 Na: 154 mmol/L, Phos: 5.1 mg/dL IVF: D5-1/2 NS @ 10 mL/hr (41 kcal from dextrose) Drips: Dilaudid @ 2 mg/hr, Versed @ 1 mg/hr, Precedex @ 0.2 mcg/kg/hr.     7/26 - Pt returned to OR yesterday; will review Surgery note from procedure once available.  - OGT removed and NGT placed while in the OR yesterday.  - NGT not secured in any way; but bilateral mittens on pt hands.  - Pt with triple lumen PICC and is receiving Clinimix E 5/15 @ 125 mL/hr which is providing 150 grams of protein (94% estimated protein need) and 2130 kcal (94% minimum estimated kcal need).  - ILE able to be started tomorrow (7/27). - Per RN during rounds, hopeful extubation 7/27.  - Abdomen was closed yesterday with plan for intestinal resection/reattachment in ~3 weeks.  - Spoke with Pharmacist who reports plan to add ILE tomorrow and possibly provide x3 days/week as to limit overfeeding of kcal.   Patient is currently intubated on ventilator support MV: 11.7L/min Temp (24hrs), Avg:100.1 F (37.8 C), Min:99 F (37.2 C), Max:101.7 F (38.7 C) BP: 119/70 and MAP: 83 IVF: D5-1/2 NS-40 mEq KCl @ 10 mL/hr (41 kcal from dextrose) Drips: Dilaudid @ 4 mg/hr, Versed @ 5 mg/hr.   *SEE CHART FOR PREVIOUS RD NOTES   Diet Order:  Diet NPO time specified TPN (CLINIMIX) Adult without lytes .TPN (CLINIMIX-E) Adult  Skin:  Wound (see comment) (Incisions to R flank and abdomen from 7/11 and 7/18), 7/19, 7/22, and 7/25  Last BM:  7/29 (via ileostomy)  Height:   Ht Readings from Last 1 Encounters:  05/14/17 '5\' 10"'  (1.778 m)    Weight:   Wt Readings from Last 1 Encounters:  05/21/17 208 lb 8.9 oz (94.6 kg)    Ideal Body Weight:  75.45 kg  BMI:  Body mass index is 29.92 kg/m.  Estimated Nutritional Needs:   Kcal:  2362  Protein:  160 grams of protein (1.8 grams/kg)  Fluid:  2.4 L/day  EDUCATION NEEDS:   No education needs identified at this  time    Jarome Matin, MS, RD, LDN, CNSC Inpatient Clinical Dietitian Pager # [phone] After hours/weekend pager # [phone]

## 2017-05-25 NOTE — Progress Notes (Signed)
Pharmacy Antibiotic Note  [name] [name] is a 56 y.o. male admitted on 05/06/2017 s/p right adrenalectomy with multiple postop complications. He has been on several antibiotics (see below) for sepsis from peritonitis.  Pharmacy is now consulted to resume vancomycin dosing for Staph aureus isolated from trach aspirate on 7/27.  Plan:  Vancomycin 1250mg  IV q12h  Check trough at steady state, goal 15-20 mcg/ml  Follow up renal function & cultures, planned duration of therapy  Height: 5\' 10"  (177.8 cm) Weight: 208 lb 8.9 oz (94.6 kg) IBW/kg (Calculated) : 73  Temp (24hrs), Avg:100.2 F (37.9 C), Min:99 F (37.2 C), Max:100.8 F (38.2 C)   Recent Labs Lab 05/21/17 0332 05/22/17 0541 05/22/17 0547 05/23/17 0345 05/24/17 0333 05/25/17 0509  WBC 26.3*  --  25.4* 17.3* 15.6* 14.5*  CREATININE 0.78 0.90  --  0.72 0.78 0.84    Estimated Creatinine Clearance: 113.3 mL/min (by C-G formula based on SCr of 0.84 mg/dL).    Allergies  Allergen Reactions  . Chantix [Varenicline] Palpitations    Heart race Increased panic attacks  . Morphine Itching  . Penicillins Rash    Has patient had a PCN reaction causing immediate rash, facial/tongue/throat swelling, SOB or lightheadedness with hypotension: Yes Has patient had a PCN reaction causing severe rash involving mucus membranes or skin necrosis: Unknown Has patient had a PCN reaction that required hospitalization: No Has patient had a PCN reaction occurring within the last 10 years: No If all of the above answers are "NO", then may proceed with Cephalosporin use.     Antimicrobials this admission: 7/17 Vancomcyin >> 7/21 7/17 Cefepime >> (7/31) 7/19 Anidulafungin >> 7/21, resume 7/26 >>  7/20 metronidazole >> 7/30  Dose adjustments this admission:  Microbiology results: 7/11 MRSA PCR: negative 7/18 Abdominal culture:  moderate Klebsiella pna - Pan sens except amp 7/19 BCx: NGF 7/22 Trach asp: multple orgs, none  predominant 7/27 Trach asp: rare Staph aureus 7/27 BCx: 1/2 CoNS  Thank you for allowing pharmacy to be a part of this patient's care.  Peggyann Juba, PharmD, BCPS Pager: [phone] 05/25/2017 9:58 AM

## 2017-05-25 NOTE — Progress Notes (Signed)
[place] NOTE   Pharmacy Consult for TPN Indication: prolonged ileus, small bowel leak, multiple bowel surgeries  Patient Measurements: Body mass index is 29.92 kg/m. Filed Weights   05/16/17 0500 05/21/17 0555 05/21/17 1111  Weight: 217 lb 13 oz (98.8 kg) 208 lb 8.9 oz (94.6 kg) 208 lb 8.9 oz (94.6 kg)   HPI: 57 yoM admitted on 7/11 for enlarging adrenal mass and planned adrenalectomy.  Pharmacy is now consulted to dose TPN.  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  Planning for return to OR in 48-72h 7/22 OR:  wash out, tube ileostomy, left with open abdomen & wound vac in place.  Plan for OR on 7/25 for closure 7/25 OR: mesh placed for abdominal closure, wound vac  Insulin requirements past 24 hours: 11 units SSI (no hx DM)  Current Nutrition: NPO  IVF: D5 1/2NS at10 ml/hr  Central access: PICC line ordered 7/19, placed on 7/20 TPN start date: 7/20  ASSESSMENT                                                                                                           Today, 05/25/17 - Glucose: within goal < 150 for most part - with change in Clinimix from 5/15 to 5/20 CBGs appear to be trending upward some - range 138-156 - Electrolytes:   Na slightly low  K slightly low (received 79meq KCl liquid yesterday)  Corrected calcium WNL  Mg/Phos WNL (received 6g Mag sulfate IV yesterday) - Renal:  SCr wnl, stable.  Lasix given 7/25 and 7/26, then continuous infusion 7/29-7/30 per PCCM d/t high volume TPN + continuous drips/abx. 7/29 I/O: 3786/3290 net +458mL - Weight:  most recent 7/26 94.6kg - LFTs: WNL, Tbili WNL - TGs:  296 (7/18), 266 (7/20), 269 (7/23).  Propofol d/c 7/24. 210 (7/28) - propofol restarted 7/28 PM but then stopped again 7/29 AM, restarted overnight 7/30 - currently at 2 mcg/kg/min (1.1 kcal/hr). - Prealbumin:  <5 (7/20), 6.1 (7/24), 10.1 (7/30)  NUTRITIONAL GOALS                                                                                             RD recs (7/27):  160 Protein (1.8 g/kg of usual body weight 89.4kg), 2300-2530 Kcal  Clinimix 5/15 at a max fluid volume of 3L at 125 ml/hr  +  20% fat emulsion 240 ml/day to provide: 150 g of protein and 2610 kCals per day meeting 94% of protein and >100% of kCal needs. - Glucose infusion rate will be 3.2 mg/kg/min (Maximum 5 mg/kg/min) - Using Lipids only 3 times/week on MWF would provide: 150 g of protein and an average of 2336 kCals per day meeting 94% of protein and 100 % of kCal needs.   Based on ASPEN guidelines, while the patient meets ICU status the initiation of lipids is being delayed for 7 days or until transition out of ICU. Planned start date for lipids is 05/22/17.  Goal will be to meet 100% of the patient's protein needs and approximately 80% of their caloric needs. Clinimix 5/15 @ 125 mL/hr to provide 150 grams protein (94% of goal) and 2130 kcal (97% of goal)    PLAN                                                                                                                          Potassium chloride 78meq IV x 3 ordered per MD  At 1800 today:  Since all electrolytes are now all WNL or low, will Change Clinimix back to electrolyte-containing formulation 5/15 at a rate of 120 ml/hr which will provide 144 grams protein (eventual goal 125 ml/hr pending overall fluid status)  Continue IV Lipids MWF  TPN to contain standard multivitamins daily and trace elements only MWF  Continue IVF at [place] and moderate SSI q6h.  TPN lab panels on Mondays &  Thursdays.  Monitor I/O with high volume TPN   Peggyann Juba, PharmD, BCPS Pager: [phone] 05/25/2017 9:13 AM

## 2017-05-25 NOTE — Progress Notes (Signed)
Date:  May 25, 2017 Chart reviewed for concurrent status and case management needs. Will continue to follow patient progress. REMAINS ON VENT,POSS.TRACHEOSTOMY WITHIN NEXT SEVERAL DAYS/QESTION OF LTACH TO ATTENDING. Discharge Planning: following for needs Expected discharge date: [id number] [name], BSN, [place], [place]

## 2017-05-26 ENCOUNTER — Inpatient Hospital Stay (HOSPITAL_COMMUNITY): Payer: Medicare Other

## 2017-05-26 DIAGNOSIS — J15211 Pneumonia due to Methicillin susceptible Staphylococcus aureus: Secondary | ICD-10-CM

## 2017-05-26 LAB — GLUCOSE, CAPILLARY
GLUCOSE-CAPILLARY: 147 mg/dL — AB (ref 65–99)
Glucose-Capillary: 152 mg/dL — ABNORMAL HIGH (ref 65–99)
Glucose-Capillary: 148 mg/dL — ABNORMAL HIGH (ref 65–99)
Glucose-Capillary: 136 mg/dL — ABNORMAL HIGH (ref 65–99)

## 2017-05-26 LAB — PHOSPHORUS: PHOSPHORUS: 3.4 mg/dL (ref 2.5–4.6)

## 2017-05-26 LAB — CBC
HCT: 23.6 % — ABNORMAL LOW (ref 39.0–52.0)
Hemoglobin: 7.6 g/dL — ABNORMAL LOW (ref 13.0–17.0)
MCH: 26.9 pg (ref 26.0–34.0)
MCHC: 32.2 g/dL (ref 30.0–36.0)
MCV: 83.4 fL (ref 78.0–100.0)
Platelets: 894 10*3/uL — ABNORMAL HIGH (ref 150–400)
RBC: 2.83 MIL/uL — ABNORMAL LOW (ref 4.22–5.81)
RDW: 15.6 % — AB (ref 11.5–15.5)
WBC: 15.8 10*3/uL — ABNORMAL HIGH (ref 4.0–10.5)

## 2017-05-26 LAB — COMPREHENSIVE METABOLIC PANEL
ALT: 12 U/L — ABNORMAL LOW (ref 17–63)
ANION GAP: 9 (ref 5–15)
AST: 24 U/L (ref 15–41)
Albumin: 1.7 g/dL — ABNORMAL LOW (ref 3.5–5.0)
Alkaline Phosphatase: 97 U/L (ref 38–126)
BUN: 20 mg/dL (ref 6–20)
CHLORIDE: 92 mmol/L — AB (ref 101–111)
CO2: 31 mmol/L (ref 22–32)
Calcium: 8 mg/dL — ABNORMAL LOW (ref 8.9–10.3)
Creatinine, Ser: 0.78 mg/dL (ref 0.61–1.24)
GFR calc non Af Amer: >60 mL/min (ref >60)
Glucose, Bld: 129 mg/dL — ABNORMAL HIGH (ref 65–99)
Potassium: 3.5 mmol/L (ref 3.5–5.1)
SODIUM: 132 mmol/L — AB (ref 135–145)
Total Bilirubin: 0.3 mg/dL (ref 0.3–1.2)
Total Protein: 6.6 g/dL (ref 6.5–8.1)

## 2017-05-26 LAB — MAGNESIUM: Magnesium: 1.6 mg/dL — ABNORMAL LOW (ref 1.7–2.4)

## 2017-05-26 LAB — TRIGLYCERIDES: TRIGLYCERIDES: 285 mg/dL — AB (ref <150)

## 2017-05-26 MED ORDER — M.V.I. ADULT IV INJ
INTRAVENOUS | Status: AC
  Administered 2017-05-26: 18:00:00 via INTRAVENOUS
  Filled 2017-05-26: qty 2000

## 2017-05-26 MED ORDER — POTASSIUM CHLORIDE 10 MEQ/50ML IV SOLN
10.0000 meq | INTRAVENOUS | Status: AC
  Administered 2017-05-26 (×3): 10 meq via INTRAVENOUS
  Filled 2017-05-26 (×3): qty 50

## 2017-05-26 MED ORDER — SODIUM CHLORIDE 0.9 % IV SOLN
1.0000 mg/h | INTRAVENOUS | Status: DC
  Administered 2017-05-27: 2 mg/h via INTRAVENOUS
  Filled 2017-05-26: qty 10

## 2017-05-26 MED ORDER — CEFAZOLIN SODIUM-DEXTROSE 1-4 GM/50ML-% IV SOLN
1.0000 g | Freq: Three times a day (TID) | INTRAVENOUS | Status: DC
  Administered 2017-05-26 – 2017-05-29 (×9): 1 g via INTRAVENOUS
  Filled 2017-05-26 (×14): qty 50

## 2017-05-26 MED ORDER — ORAL CARE MOUTH RINSE
15.0000 mL | OROMUCOSAL | Status: DC
  Administered 2017-05-26 – 2017-05-29 (×23): 15 mL via OROMUCOSAL

## 2017-05-26 MED ORDER — MAGNESIUM SULFATE 2 GM/50ML IV SOLN
2.0000 g | Freq: Once | INTRAVENOUS | Status: AC
  Administered 2017-05-26: 2 g via INTRAVENOUS
  Filled 2017-05-26: qty 50

## 2017-05-26 MED ORDER — CHLORHEXIDINE GLUCONATE 0.12% ORAL RINSE (MEDLINE KIT)
15.0000 mL | Freq: Two times a day (BID) | OROMUCOSAL | Status: DC
  Administered 2017-05-26 – 2017-05-28 (×5): 15 mL via OROMUCOSAL

## 2017-05-26 MED ORDER — MIDAZOLAM HCL 2 MG/2ML IJ SOLN
1.0000 mg | INTRAMUSCULAR | Status: DC | PRN
  Administered 2017-05-26 – 2017-05-27 (×4): 2 mg via INTRAVENOUS
  Administered 2017-05-27 (×2): 1 mg via INTRAVENOUS
  Administered 2017-05-27 – 2017-05-28 (×2): 2 mg via INTRAVENOUS
  Filled 2017-05-26: qty 2

## 2017-05-26 NOTE — Progress Notes (Signed)
[name] NOTE   Pharmacy Consult for TPN Indication: prolonged ileus, small bowel leak, multiple bowel surgeries  Patient Measurements: Body mass index is 29.51 kg/m. Filed Weights   05/21/17 0555 05/21/17 1111 05/26/17 0430  Weight: 208 lb 8.9 oz (94.6 kg) 208 lb 8.9 oz (94.6 kg) 205 lb 11 oz (93.3 kg)   HPI: 44 yoM admitted on 7/11 for enlarging adrenal mass and planned adrenalectomy.  Pharmacy is now consulted to dose TPN.  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  Planning for return to OR in 48-72h 7/22 OR:  wash out, tube ileostomy, left with open abdomen & wound vac in place.  Plan for OR on 7/25 for closure 7/25 OR: mesh placed for abdominal closure, wound vac  Insulin requirements past 24 hours: 11 units SSI (no hx DM)  Current Nutrition: NPO  IVF: D5 1/2NS at10 ml/hr  Central access: PICC line ordered 7/19, placed on 7/20 TPN start date: 7/20  ASSESSMENT                                                                                                           Today, 05/26/17 - Glucose: within goal < 150 for most part, range 139-153 - Electrolytes:   Na slightly low  K at low-end of normal range (received 3 runs IV KCl yesterday)  Corrected calcium WNL  Mg slightly low  Phos WNL  - Renal:  SCr wnl, stable.  Lasix continuous infusion 7/29-7/31 per PCCM d/t high volume TPN + continuous drips/abx. 7/23 I/O: 3819/4850 net -20.40mL - Weight: most recent 7/31 93.3kg down from 94.6kg on 7/26 - LFTs: WNL, Tbili WNL - TGs:  296 (7/18), 266 (7/20), 269 (7/23), 210  (7/28) - propofol restarted overnight 7/30 - required up to 15 mcg/kg/min (9.4 kcal/hr). TG pending this AM. - Prealbumin:  <5 (7/20), 6.1 (7/24), 10.1 (7/30)  NUTRITIONAL GOALS                                                                                             RD recs (7/27):  160 Protein (1.8 g/kg of usual body weight 89.4kg), 2300-2530 Kcal  Clinimix 5/15 at a max fluid volume of 3L at 125 ml/hr  + 20% fat emulsion 240 ml/day to provide: 150 g of protein and 2610 kCals per day meeting 94% of protein  and >100% of kCal needs. - Glucose infusion rate will be 3.2 mg/kg/min (Maximum 5 mg/kg/min) - Using Lipids only 3 times/week on MWF would provide: 150 g of protein and an average of 2336 kCals per day meeting 94% of protein and 100 % of kCal needs.   Based on ASPEN guidelines, while the patient meets ICU status the initiation of lipids is being delayed for 7 days or until transition out of ICU. Planned start date for lipids is 05/22/17.  Goal will be to meet 100% of the patient's protein needs and approximately 80% of their caloric needs. Clinimix 5/15 @ 125 mL/hr to provide 150 grams protein (94% of goal) and 2130 kcal (97% of goal)    PLAN                                                                                                                          Magnesium sulfate 2g IV x 1  At 1800 today:  Continue Clinimix E 5/15 at 120 ml/hr (eventual goal 125 ml/hr pending overall fluid status and decreased caloric provision from propofol)  Continue IV Lipids MWF  TPN to contain standard multivitamins daily and trace elements only MWF  Continue IVF at [place] and moderate SSI q6h.  TPN lab panels on Mondays & Thursdays.  Monitor I/O with high volume TPN   Peggyann Juba, PharmD, BCPS Pager: [phone] 05/26/2017 8:15 AM

## 2017-05-26 NOTE — Progress Notes (Signed)
RT Note:  Patient was placed back on full support due to increase in RR and HR

## 2017-05-26 NOTE — Progress Notes (Signed)
[facility] Note  ADMISSION DATE:  05/06/2017  CONSULTATION DATE:  05/13/17  REFERRING MD:  Kieth Brightly  CHIEF COMPLAINT:  Vent Management  Presenting HPI:  56 y.o. 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 On 7/18, he developed a leak therefore was taken back to OR for re-do of ex-lap and repair of leak.  He returned to the ICU on the vent and PCCM was consulted for vent management.  Subjective:   No acute events overnight. Tmax 101.  WBC 15.8.  Attempted weaning on 10/5 > pt became tachypneic   Vent Mode: PRVC FiO2 (%):  [30 %-40 %] 30 % Set Rate:  [16 bmp] 16 bmp Vt Set:  [580 mL] 580 mL PEEP:  [5 cmH20] 5 cmH20 Plateau Pressure:  [12 cmH20-26 cmH20] 26 cmH20  Temp:  [97.3 F (36.3 C)-101 F (38.3 C)] 99.8 F (37.7 C) (07/31 0403) Pulse Rate:  [88-124] 97 (07/31 0759) Resp:  [18-32] 21 (07/31 0759) BP: (91-192)/(50-93) 105/56 (07/31 0759) SpO2:  [94 %-100 %] 98 % (07/31 0759) FiO2 (%):  [30 %-40 %] 30 % (07/31 0759) Weight:  [205 lb 11 oz (93.3 kg)] 205 lb 11 oz (93.3 kg) (07/31 0430)  General: adult male in NAD on vent   HEENT: MM pink/moist, no jvd Neuro: sedate, opens eyes to voice, generalized weakness CV: s1s2 rrr, no m/r/g PULM: even/non-labored, lungs bilaterally clear GI: midline catheter x2 with small amt brown drainage on gauze, JP drain x1, RUQ VAC clean/dry/intact Extremities: warm/dry, improved edema  Skin: no rashes or lesions, abd as above    CBC Latest Ref Rng & Units 05/26/2017 05/25/2017 05/24/2017  WBC 4.0 - 10.5 K/uL 15.8(H) 14.5(H) 15.6(H)  Hemoglobin 13.0 - 17.0 g/dL 7.6(L) 7.4(L) 8.3(L)  Hematocrit 39.0 - 52.0 % 23.6(L) 22.5(L) 25.6(L)  Platelets 150 - 400 K/uL 894(H) 857(H) 792(H)   BMP Latest Ref Rng & Units 05/26/2017 05/25/2017 05/24/2017   Glucose 65 - 99 mg/dL 129(H) 151(H) 143(H)  BUN 6 - 20 mg/dL _0 Creatinine 0.61 - 1.24 mg/dL 0.78 0.84 0.78  Sodium 135 - 145 mmol/L 132(L) 131(L) 132(L)  Potassium 3.5 - 5.1 mmol/L 3.5 3.4(L) 3.8  Chloride 101 - 111 mmol/L 92(L) 94(L) 96(L)  CO2 22 - 32 mmol/L _1 Calcium 8.9 - 10.3 mg/dL 8.0(L) 7.6(L) 7.7(L)   Hepatic Function Latest Ref Rng & Units 05/26/2017 05/25/2017 05/23/2017  Total Protein 6.5 - 8.1 g/dL 6.6 6.1(L) 5.3(L)  Albumin 3.5 - 5.0 g/dL 1.7(L) 1.6(L) 1.5(L)  AST 15 - 41 U/L _2 ALT 17 - 63 U/L 12(L) 12(L) 13(L)  Alk Phosphatase 38 - 126 U/L 97 80 67  Total Bilirubin 0.3 - 1.2 mg/dL 0.3 0.3 0.4    IMAGING/STUDIES: CT ABD/PELVIS W/ CONTRAST 7/18: IMPRESSION: 1. Diffuse dilatation of the small greater the large bowel with fluid levels throughout the small and large bowel, most compatible with adynamic ileus in the postoperative state. No bowel wall thickening. No discrete small bowel caliber transition to suggest obstruction. 2. Pneumoperitoneum in the right upper quadrant is presumably due to recent surgery . 3. Small postoperative fluid collection at the right adrenalectomy site without wall thickening or internal gas. 4. Small dependent right pleural effusion. 5. Patchy bibasilar lung consolidation and ground-glass attenuation with some volume loss, concerning  for multilobar pneumonia and/or aspiration with some atelectasis. PORT CXR 7/23: Improving left upper lobe opacity. Enteric feeding tube coursing below diaphragm and seeming to occur towards the fundus of stomach. Endotracheal tube in good position above the carina. No new focal opacity appreciated. Persistent silhouetting left hemidiaphragm. PORT CXR 7/25: small bilateral pleural effusions VENOUS DUPLEX 7/25 >> preliminary negative for DVT, superficial vein thrombosis in the cephalic vein  MICROBIOLOGY: MRSA PCR 7/11:  Negative Abdominal Wound Culture 7/18:  Klebsiella pneumoniae (resistant  to Ampicillin) Blood Cultures x2 7/19: negative  Tracheal Aspirate Culture 7/22: multiple organisms present, none predominant BCx2 7/27 >> 1/2 coag neg staph >> suspected contaminant  BAL 7/27 >> MSSA  BCID 7/27 >> staph detected, MRSA detected   ANTIBIOTICS:  PCN ALLERGIC Aztreonam 7/17 (x1 dose) Vancomycin 7/17 - 7/21 Cefeime 7/17 >>> 7/31   Eraxis 7/19 >>> 7/21, 7/26 >> 8/1 Flagyl 7/20 >>> 7/30 Vancomycin 7/30 >>  LINES/TUBES: OETT 07/18 >>>  RUE PICC 7/20 >>>  RAD ART LINE 7/18 >>> 7/23 Foley 7/28 >>>  SIGNIFICANT EVENTS: 07/11 - Admit for laparoscopic converted to open right adrenal ectomy w/  Small bowel resection and reanastamosis. Wound vac placed. 07/18 - Found to have leak >> taken back to OR for re-do exploratory laparotomy. Returned to ICU on vent. 07/19 - NGT pulled out and almost self-extubated. Developed cuff leak >> tube exchanged. Back to OR emergently for intestinal leak/ischemic bowel. 07/22 - Back to OR for wash out >> left with open abdomen & wound vac in place. Transfused 1u PRBC. 07/23 - Intermittent agitation, sedation adjusted  07/25 - rising temp, WBC increased. Returned to OR for closure 07/27 - Recultured, sedation down on precedex 07/30 - Remains on sedation, VAC leak over the weekend with dressing change by [facility] 07/31 - Failed SBT > tachypnea/anxiety  ASSESSMENT/PLAN:  56 y.o. male admitted with adrenal mass positive for metanephrine's s/p right adrenalectomy complicated by small bowel injury in the setting of adhesions with sepsis secondary to intra-abdominal infection. Patient with persistent hypoxic respiratory failure.  Sepsis - in the setting of intra-abdominal infection with anastomotic leak resulting in peritonitis.   Fever - intermittent, ongoing.   P: Stop Cefepime after today's dosing  Stop date added for Eraxis > 7 days total planned Continue vancomycin as above Follow cultures  Monitor fever curve / WBC trend   Acute hypoxic  respiratory failure: in setting of sepsis, post-op adrenalectomy. P:  PRVC 8cc/kg Wean PEEP / FiO2 for sats > 92% Daily SBT / WUA  Duoneb Q6 Intermittent CXR  VAP prevention measures Suspect he will need trach > working toward extubation but pain control remains an issue Discontinue lasix gtt    MSSA HCAP  P: ABX as above Vancomycin as above Follow CXR   Essential hypertension - in setting of pheochromocytoma s/p resection  P: ICU monitoring of hemodynamics  PRN labetalol for SBP >170    Intestinal perforation - in setting of adhesions during adrenalectomy s/p reanastamosis P: Post op care / recommendations per CCS  OK for extubation from surgery NGT to LIS  TPN per pharmacy   Anemia - bloody drainage from NGT  Thrombocytosis  P: Trend CBC  Lovenox for DVT prophylaxis  Transfuse for Hgb <7% Minimize lab draws as able      UE Swelling - preliminary [name] results negative  P: Discontinue lasix gtt  Monitor electrolytes with diuresis    History of hypothyroidism  P: Repeat TSH ~ 9/1   Tachyphylaxis -  while on propofol / fentanyl  P: Dilaudid gtt for pain  Versed gtt for sedation  Propofol gtt for sedation Attempt to minimize sedation as able     Hypokalemia  At Risk AKI P: Trend BMP / urinary output Replace electrolytes as indicated Avoid nephrotoxic agents, ensure adequate renal perfusion    Hyperglycemia P: CBG with SSI    Protein calorie malnutrition P:  TPN per pharmacy    Tobacco use disorder P: Nicotine patch  Smoking cessation counseling when able    Prophylaxis:  SCD's, Lovenox, Protonix    Diet:  NPO. Continue TPN. Code Status:  Full Code   Disposition:  ICU. Remains sedated / intubated  Family Update: Wife updated at bedside 7/31.  She has multiple concerns about pain control. Doesn't want him to hurt.  Explained in detail that it pain control will be a balance given sedating effects of medications and respiratory drive.  We  reviewed that he will likely not be pain free but we will work toward a tolerable / comfortable level to allow for respiratory effort and weaning in hopes to avoid a trach / prolonged ICU stay.  Unfortunately, he failed weaning this am.   CC Time:  59 minutes  Noe Gens, NP-C [facility] Pgr: [phone] or if no answer [phone] 05/26/2017, 8:02 AM

## 2017-05-26 NOTE — Progress Notes (Signed)
1945-Patient agitated and restless. Unsuccessful attempt made to calm patient down by verbal  Reorientation/calming techniques. Bolus of Dilaudid & Versed given, but patient still remains agitated by attempting to pull ETT. Propofol infusion titrated up to achieve RASS score of -1.  2000- Patient calm, cooperative and no longer interfering with tubes/lines. Will continue to monitor. Patient wife @ bedside.

## 2017-05-26 NOTE — Progress Notes (Signed)
Patient became inconsolably agitated and restless despite Dilaudid bolus. RTR and RN in room attempted suctioning oral and ETT with no relief patient continued to be dyssynchronous with the vent. Patient bolused per order and sedation increased. Patient attempting to remove equipment despite redirects. NP called and received verbal orders to restart the Versed gtt. RN to continue to monitor.

## 2017-05-27 ENCOUNTER — Inpatient Hospital Stay (HOSPITAL_COMMUNITY): Payer: Medicare Other

## 2017-05-27 DIAGNOSIS — K659 Peritonitis, unspecified: Secondary | ICD-10-CM

## 2017-05-27 LAB — CBC
HCT: 21.6 % — ABNORMAL LOW (ref 39.0–52.0)
Hemoglobin: 7.1 g/dL — ABNORMAL LOW (ref 13.0–17.0)
MCH: 27.1 pg (ref 26.0–34.0)
MCHC: 32.9 g/dL (ref 30.0–36.0)
MCV: 82.4 fL (ref 78.0–100.0)
PLATELETS: 847 10*3/uL — AB (ref 150–400)
RBC: 2.62 MIL/uL — AB (ref 4.22–5.81)
RDW: 16 % — AB (ref 11.5–15.5)
WBC: 14.2 10*3/uL — ABNORMAL HIGH (ref 4.0–10.5)

## 2017-05-27 LAB — BASIC METABOLIC PANEL
Anion gap: 6 (ref 5–15)
BUN: 23 mg/dL — AB (ref 6–20)
CALCIUM: 8.3 mg/dL — AB (ref 8.9–10.3)
CO2: 31 mmol/L (ref 22–32)
CREATININE: 0.69 mg/dL (ref 0.61–1.24)
Chloride: 96 mmol/L — ABNORMAL LOW (ref 101–111)
GFR calc non Af Amer: >60 mL/min (ref >60)
Glucose, Bld: 130 mg/dL — ABNORMAL HIGH (ref 65–99)
Potassium: 4 mmol/L (ref 3.5–5.1)
SODIUM: 133 mmol/L — AB (ref 135–145)

## 2017-05-27 LAB — GLUCOSE, CAPILLARY
GLUCOSE-CAPILLARY: 139 mg/dL — AB (ref 65–99)
Glucose-Capillary: 136 mg/dL — ABNORMAL HIGH (ref 65–99)
Glucose-Capillary: 120 mg/dL — ABNORMAL HIGH (ref 65–99)

## 2017-05-27 LAB — MAGNESIUM: Magnesium: 2 mg/dL (ref 1.7–2.4)

## 2017-05-27 LAB — CULTURE, BLOOD (ROUTINE X 2)
Culture: NO GROWTH
SPECIAL REQUESTS: ADEQUATE

## 2017-05-27 LAB — PHOSPHORUS: Phosphorus: 4.3 mg/dL (ref 2.5–4.6)

## 2017-05-27 MED ORDER — M.V.I. ADULT IV INJ
INTRAVENOUS | Status: DC
  Filled 2017-05-27: qty 2880

## 2017-05-27 MED ORDER — ACETAMINOPHEN 10 MG/ML IV SOLN
1000.0000 mg | Freq: Four times a day (QID) | INTRAVENOUS | Status: AC
  Administered 2017-05-27 – 2017-05-28 (×4): 1000 mg via INTRAVENOUS
  Filled 2017-05-27 (×4): qty 100

## 2017-05-27 MED ORDER — TRACE MINERALS CR-CU-MN-SE-ZN 10-1000-500-60 MCG/ML IV SOLN
INTRAVENOUS | Status: AC
  Administered 2017-05-27: 18:00:00 via INTRAVENOUS
  Filled 2017-05-27: qty 2000

## 2017-05-27 NOTE — Progress Notes (Signed)
Progress Note: General Surgery Service   Assessment/Plan: Patient Active Problem List   Diagnosis Date Noted  . Mucus plugging of bronchi   . Acute respiratory failure with hypoxemia ([place])   . Ileus ([place])   . Hypokalemia 05/14/2017  . Anastomotic leak of intestine 05/14/2017  . Severe sepsis ([place]) 05/14/2017  . Wound dehiscence, surgical 05/14/2017  . Aspiration pneumonia ([place]) 05/14/2017  . Chronic narcotic use 05/10/2017  . Anxiety state 05/10/2017  . Intra-abdominal adhesions s/p SB resection 05/06/2017 05/09/2017  . 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 Right adrenal mass s/p adrenalectomy 05/06/2017 05/06/2017  . Throat symptom 03/18/2016  . Multinodular goiter 01/19/2016  . Hyperthyroidism 01/19/2016  . Essential hypertension   . Diarrhea 11/30/2014  . Anemia, iron deficiency 11/01/2014  . Leukocytosis 11/03/2013  . Tobacco abuse 07/26/2013  . COPD (chronic obstructive pulmonary disease) ([place]) 07/26/2013  . Left knee pain 07/26/2013  . Pain in joint, shoulder region 05/03/2013  . GERD 07/24/2008  . 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 Albumin and prealbumin slightly increased, continud issues with analgesia and weaning  Add ofirmev scheduled for 48h as nonnarcotic analgesia   LOS: 21 days  Chief Complaint/Subjective: One episode of agitation overnight, able to come down some on sedatives  Objective: Vital signs in last 24 hours: Temp:  [98.8 F (37.1 C)-100.9 F (38.3 C)] 100.9 F (38.3 C) (08/01 0800) Pulse Rate:  [82-138] 116 (08/01 0900) Resp:  [16-39] 34 (08/01 0900) BP: (90-201)/(44-117) 130/61 (08/01 0900) SpO2:  [92 %-100 %] 95 % (08/01 0900) FiO2 (%):  [30 %] 30 % (08/01 0853) Last BM Date:  (ileal tubes)  Intake/Output from previous day: 07/31 0701 - 08/01 0700 In: 3191.7 [I.V.:2861.7; IV Piggyback:330] Out: 3975 [Urine:2425; Emesis/NG  output:1100; Drains:450] Intake/Output this shift: Total I/O In: 288 [I.V.:288] Out: 25 [Drains:25]  Lungs: coarse b/l  Cardiovascular: tachycardic  Abd: vac changes, good granulation tissue at borders, minimal drainage  Extremities: trace edema  Neuro: GCS 6t  Lab Results: CBC   Recent Labs  05/26/17 0414 05/27/17 0441  WBC 15.8* 14.2*  HGB 7.6* 7.1*  HCT 23.6* 21.6*  PLT 894* 847*   BMET  Recent Labs  05/26/17 0414 05/27/17 0441  NA 132* 133*  K 3.5 4.0  CL 92* 96*  CO2 31 31  GLUCOSE 129* 130*  BUN 20 23*  CREATININE 0.78 0.69  CALCIUM 8.0* 8.3*   PT/INR No results for input(s): LABPROT, INR in the last 72 hours. ABG  Recent Labs  05/25/17 0300  PHART 7.424  HCO3 29.6*    Studies/Results:  Anti-infectives: Anti-infectives    Start     Dose/Rate Route Frequency Ordered Stop   05/26/17 2200  ceFAZolin (ANCEF) IVPB 1 g/50 mL premix     1 g 100 mL/hr over 30 Minutes Intravenous Every 8 hours 05/26/17 1008     05/25/17 1100  vancomycin (VANCOCIN) 1,250 mg in sodium chloride 0.9 % 250 mL IVPB  Status:  Discontinued     1,250 mg 166.7 mL/hr over 90 Minutes Intravenous Every 12 hours 05/25/17 0955 05/26/17 0946   05/22/17 1400  anidulafungin (ERAXIS) 100 mg in sodium chloride 0.9 % 100 mL IVPB     100 mg 78 mL/hr over 100 Minutes Intravenous Every 24 hours 05/21/17 1330 05/27/17 2359   05/21/17 1400  anidulafungin (ERAXIS) 200 mg in sodium chloride 0.9 % 200 mL IVPB  200 mg 78 mL/hr over 200 Minutes Intravenous  Once 05/21/17 1330 05/21/17 1940   05/15/17 1000  anidulafungin (ERAXIS) 100 mg in sodium chloride 0.9 % 100 mL IVPB  Status:  Discontinued     100 mg 78 mL/hr over 100 Minutes Intravenous Every 24 hours 05/14/17 0829 05/16/17 0901   05/15/17 1000  metroNIDAZOLE (FLAGYL) IVPB 500 mg  Status:  Discontinued     500 mg 100 mL/hr over 60 Minutes Intravenous Every 8 hours 05/15/17 0903 05/25/17 0938   05/14/17 0900  anidulafungin (ERAXIS)  200 mg in sodium chloride 0.9 % 200 mL IVPB     200 mg 78 mL/hr over 200 Minutes Intravenous  Once 05/14/17 0829 05/14/17 1325   05/13/17 1927  vancomycin (VANCOCIN) 1-5 GM/200ML-% IVPB    Comments:  Ward, Christa   : cabinet override      05/13/17 1927 05/14/17 0744   05/12/17 1400  ceFEPIme (MAXIPIME) 1 g in dextrose 5 % 50 mL IVPB     1 g 100 mL/hr over 30 Minutes Intravenous Every 8 hours 05/12/17 0939 05/26/17 2359   05/12/17 0900  vancomycin (VANCOCIN) IVPB 1000 mg/200 mL premix  Status:  Discontinued     1,000 mg 200 mL/hr over 60 Minutes Intravenous Every 12 hours 05/12/17 0750 05/16/17 0852   05/12/17 0830  aztreonam (AZACTAM) 2 GM IVPB     2 g 100 mL/hr over 30 Minutes Intravenous  Once 05/12/17 0800 05/12/17 0957   05/06/17 0916  vancomycin (VANCOCIN) IVPB 1000 mg/200 mL premix     1,000 mg 200 mL/hr over 60 Minutes Intravenous On call to O.R. 05/06/17 0916 05/06/17 1229      Medications: Scheduled Meds: . bisacodyl  10 mg Rectal Daily  . chlorhexidine gluconate (MEDLINE KIT)  15 mL Mouth Rinse BID  . Chlorhexidine Gluconate Cloth  6 each Topical Q0600  . enoxaparin (LOVENOX) injection  40 mg Subcutaneous Q24H  . insulin aspart  0-15 Units Subcutaneous Q6H  . ipratropium-albuterol  3 mL Nebulization Q6H  . lip balm  1 application Topical BID  . mouth rinse  15 mL Mouth Rinse 10 times per day  . nicotine  21 mg Transdermal Daily  . pantoprazole (PROTONIX) IV  40 mg Intravenous Q24H  . thiamine injection  100 mg Intravenous Daily   Continuous Infusions: . Marland KitchenTPN (CLINIMIX-E) Adult 120 mL/hr at 05/27/17 0800  . Marland KitchenTPN (CLINIMIX-E) Adult    . anidulafungin Stopped (05/26/17 1445)  .  ceFAZolin (ANCEF) IV Stopped (05/27/17 3300)  . dextrose 5 % and 0.45% NaCl 10 mL/hr at 05/27/17 0800  . HYDROmorphone 6 mg/hr (05/27/17 0800)  . midazolam (VERSED) infusion 2 mg/hr (05/27/17 0847)  . propofol (DIPRIVAN) infusion 20.085 mcg/kg/min (05/27/17 0800)   PRN Meds:.acetaminophen,  HYDROmorphone, labetalol, magic mouthwash, midazolam, ondansetron **OR** ondansetron (ZOFRAN) IV, ondansetron (ZOFRAN) IV, sodium chloride flush  Mickeal Skinner, MD Pg# [phone] [facility]

## 2017-05-27 NOTE — Progress Notes (Signed)
[facility] Note  ADMISSION DATE:  05/06/2017  CONSULTATION DATE:  05/13/17  REFERRING MD:  Kieth Brightly  CHIEF COMPLAINT:  Vent Management  Presenting HPI:  56 y.o. 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 On 7/18, he developed a leak therefore was taken back to OR for re-do of ex-lap and repair of leak.  He returned to the ICU on the vent and PCCM was consulted for vent management.  IMAGING/STUDIES: CT ABD/PELVIS W/ CONTRAST 7/18: 1. Diffuse dilatation of the small greater the large bowel with fluid levels throughout the small and large bowel, most compatible with adynamic ileus in the postoperative state. No bowel wall thickening. No discrete small bowel caliber transition to suggest obstruction. 2. Pneumoperitoneum in the right upper quadrant is presumably due to recent surgery . 3. Small postoperative fluid collection at the right adrenalectomy site without wall thickening or internal gas. 4. Small dependent right pleural effusion. 5. Patchy bibasilar lung consolidation and ground-glass attenuation with some volume loss, concerning for multilobar pneumonia and/or aspiration with some atelectasis.  VENOUS DUPLEX 7/25 >> preliminary negative for DVT, superficial vein thrombosis in the cephalic vein  MICROBIOLOGY: MRSA PCR 7/11:  Negative Abdominal Wound Culture 7/18:  Klebsiella pneumoniae (resistant to Ampicillin) Blood Cultures x2 7/19: negative  Tracheal Aspirate Culture 7/22: multiple organisms present, none predominant BAL 7/27 >> MSSA  BC 7/27 >> MRSE 1/2 > contaminant ?  ANTIBIOTICS:  PCN ALLERGIC Aztreonam 7/17 (x1 dose) Vancomycin 7/17 - 7/21 Cefeime 7/17 >>> 7/31   Eraxis 7/19 >>> 7/21, 7/26 >> 8/1 Flagyl 7/20 >>> 7/30 Vancomycin 7/30 >> 7/31 Ancef 7/31  >>  LINES/TUBES: OETT 07/18 >>>  RUE PICC 7/20 >>>  RAD ART LINE 7/18 >>> 7/23 Foley 7/28 >>>  SIGNIFICANT EVENTS: 07/11 - Admit for laparoscopic converted to open right adrenal ectomy w/  Small bowel resection and reanastamosis. Wound vac placed. 07/18 - Found to have leak >> taken back to OR for re-do exploratory laparotomy. Returned to ICU on vent. 07/19 - NGT pulled out and almost self-extubated. Developed cuff leak >> tube exchanged. Back to OR emergently for intestinal leak/ischemic bowel. 07/22 - Back to OR for wash out >> left with open abdomen & wound vac in place. Transfused 1u PRBC. 07/23 - Intermittent agitation, sedation adjusted  07/25 - rising temp, WBC increased. Returned to OR for closure 07/27 - Recultured, sedation down on precedex 07/30 - Remains on sedation, VAC leak over the weekend with dressing change by [facility] 07/31 - Failed SBT > tachypnea/anxiety    Subjective:   Low gr febrile Critically ill, intubated on propofol/versed & dilaudid gttt  Vent Mode: PSV;CPAP FiO2 (%):  [30 %] 30 % Set Rate:  [16 bmp] 16 bmp Vt Set:  [580 mL] 580 mL PEEP:  [5 cmH20] 5 cmH20 Pressure Support:  [5 cmH20-10 cmH20] 10 cmH20 Plateau Pressure:  [10 cmH20-25 cmH20] 18 cmH20  Temp:  [98.8 F (37.1 C)-100.9 F (38.3 C)] 100.9 F (38.3 C) (08/01 0800) Pulse Rate:  [82-138] 116 (08/01 0900) Resp:  [16-39] 34 (08/01 0900) BP: (90-201)/(44-117) 130/61 (08/01 0900) SpO2:  [92 %-100 %] 95 % (08/01 0900) FiO2 (%):  [30 %] 30 % (08/01 0853)  General: adult male in NAD on vent   HEENT: MM pink/moist, no jvd Neuro: awake,opens eyes to voice, generalized weakness CV:  s1s2 rrr, no m/r/g PULM: even/non-labored, lungs bilaterally clear GI: midline ileal drain  x2 with small amt brown drainage on gauze, JP drain x1, RUQ VAC clean/dry/intact Extremities: warm/dry, improved edema  Skin: no rashes or lesions, abd as above    CBC Latest Ref Rng & Units 05/27/2017 05/26/2017 05/25/2017   WBC 4.0 - 10.5 K/uL 14.2(H) 15.8(H) 14.5(H)  Hemoglobin 13.0 - 17.0 g/dL 7.1(L) 7.6(L) 7.4(L)  Hematocrit 39.0 - 52.0 % 21.6(L) 23.6(L) 22.5(L)  Platelets 150 - 400 K/uL 847(H) 894(H) 857(H)   BMP Latest Ref Rng & Units 05/27/2017 05/26/2017 05/25/2017  Glucose 65 - 99 mg/dL 130(H) 129(H) 151(H)  BUN 6 - 20 mg/dL 23(H) 20 19  Creatinine 0.61 - 1.24 mg/dL 0.69 0.78 0.84  Sodium 135 - 145 mmol/L 133(L) 132(L) 131(L)  Potassium 3.5 - 5.1 mmol/L 4.0 3.5 3.4(L)  Chloride 101 - 111 mmol/L 96(L) 92(L) 94(L)  CO2 22 - 32 mmol/L _0 Calcium 8.9 - 10.3 mg/dL 8.3(L) 8.0(L) 7.6(L)   Hepatic Function Latest Ref Rng & Units 05/26/2017 05/25/2017 05/23/2017  Total Protein 6.5 - 8.1 g/dL 6.6 6.1(L) 5.3(L)  Albumin 3.5 - 5.0 g/dL 1.7(L) 1.6(L) 1.5(L)  AST 15 - 41 U/L _1 ALT 17 - 63 U/L 12(L) 12(L) 13(L)  Alk Phosphatase 38 - 126 U/L 97 80 67  Total Bilirubin 0.3 - 1.2 mg/dL 0.3 0.3 0.4    ASSESSMENT/PLAN:  56 y.o. male admitted with adrenal mass positive for metanephrine's s/p right adrenalectomy complicated by small bowel injury in the setting of adhesions with sepsis secondary to intra-abdominal infection. Intubated  X 13 days , requirnig multiple sedatives (chronic ativan & percocet use)  Sepsis -anastomotic leak resulting in peritonitis. MSSA HCAP    Fever - intermittent, ongoing.   P: Completed 14 ds of cefepime Ct ancef  -duration unclear Eraxis > 7 days total planned Monitor fever curve / WBC trend   Acute hypoxic respiratory failure: in setting of sepsis, post-op adrenalectomy. 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 Remains sedated / intubated   Family Update: Wife updated at bedside   The patient is critically ill with multiple organ systems failure and requires high complexity decision making for assessment and support, frequent evaluation and titration of therapies, application of advanced monitoring technologies and extensive interpretation of multiple databases. Critical Care Time devoted to patient care services described in this note independent of APP time is 35 minutes.    Kara Mead MD. [facility]. [facility] Pager [phone] If no  response call [phone]    05/27/2017, 11:01 AM

## 2017-05-27 NOTE — Progress Notes (Signed)
[place] NOTE   Pharmacy Consult for TPN Indication: prolonged ileus, small bowel leak, multiple bowel surgeries  Patient Measurements: Body mass index is 29.51 kg/m. Filed Weights   05/21/17 0555 05/21/17 1111 05/26/17 0430  Weight: 208 lb 8.9 oz (94.6 kg) 208 lb 8.9 oz (94.6 kg) 205 lb 11 oz (93.3 kg)   HPI: 76 yoM admitted on 7/11 for enlarging adrenal mass and planned adrenalectomy.  Pharmacy is now consulted to dose TPN.  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  Planning for return to OR in 48-72h 7/22 OR:  wash out, tube ileostomy, left with open abdomen & wound vac in place.  Plan for OR on 7/25 for closure 7/25 OR: mesh placed for abdominal closure, wound vac  Insulin requirements past 24 hours: 11 units SSI (no hx DM)  Current Nutrition: NPO  IVF: D5 1/2NS at10 ml/hr  Central access: PICC line ordered 7/19, placed on 7/20 TPN start date: 7/20  ASSESSMENT                                                                                                           Today, 05/27/17 - Glucose: within goal < 150 for most part, range 136-152 - Electrolytes:   Na slightly low  K now in normal range (received 3 runs IV KCl yesterday)  Corrected calcium WNL  Mg in normal range (received 2g mag sulfate yesterday)  Phos in normal range, trending up  - Renal:  SCr wnl, stable.  Lasix continuous infusion 7/29-7/31 per PCCM d/t high volume TPN + continuous drips/abx. 7/31 I/O: 3352/3975 net -676mL - Weight: most recent 7/31 93.3kg down from 94.6kg on 7/26 - LFTs: WNL,  Tbili WNL - TGs:  296 (7/18), 266 (7/20), 269 (7/23), 210 (7/28) - propofol restarted overnight 7/30 - required up to 25 mcg/kg/min (15.6 kcal/hr). TG increased to 285 (7/31) - Prealbumin:  <5 (7/20), 6.1 (7/24), 10.1 (7/30)  NUTRITIONAL GOALS                                                                                             RD recs (7/27):  160 Protein (1.8 g/kg of usual body weight 89.4kg), 2300-2530 Kcal  Clinimix 5/15 at a max fluid volume of 3L at 125 ml/hr  + 20% fat emulsion 240 ml/day to provide: 150 g of  protein and 2610 kCals per day meeting 94% of protein and >100% of kCal needs. - Glucose infusion rate will be 3.2 mg/kg/min (Maximum 5 mg/kg/min) - Using Lipids only 3 times/week on MWF would provide: 150 g of protein and an average of 2336 kCals per day meeting 94% of protein and 100 % of kCal needs.   Based on ASPEN guidelines, while the patient meets ICU status the initiation of lipids is being delayed for 7 days or until transition out of ICU. Planned start date for lipids is 05/22/17.  Goal will be to meet 100% of the patient's protein needs and approximately 80% of their caloric needs. Clinimix 5/15 @ 125 mL/hr to provide 150 grams protein (94% of goal) and 2130 kcal (97% of goal)    PLAN                                                                                                                          At 1800 today:  Continue Clinimix E 5/15 at 120 ml/hr (eventual goal 125 ml/hr pending overall fluid status and decreased caloric provision from propofol)  Withhold lipids today due to high propofol requirements  TPN to contain standard multivitamins daily and trace elements only MWF  Continue IVF at [place] and moderate SSI q6h.  TPN lab panels on Mondays & Thursdays.  Monitor I/O with high volume TPN   Peggyann Juba, PharmD, BCPS Pager: [phone] 05/27/2017 8:48 AM

## 2017-05-27 NOTE — Consult Note (Signed)
WOC not re-consulted for wound care after surgery 05/13/17, 05/14/17., 05/17/17 or 05/20/17.  Reviewed chart surgical dressings have been changed in OR and noted to have been changed 05/22/17. WOC not following for wound care at this time.   Re consult if needed, will not follow at this time. Thanks  Jamieon Lannen [employer], RN,CWOCN, CNS, [place] [phone])

## 2017-05-28 ENCOUNTER — Inpatient Hospital Stay (HOSPITAL_COMMUNITY): Payer: Medicare Other

## 2017-05-28 LAB — COMPREHENSIVE METABOLIC PANEL
ALT: 18 U/L (ref 17–63)
AST: 38 U/L (ref 15–41)
Albumin: 1.7 g/dL — ABNORMAL LOW (ref 3.5–5.0)
Alkaline Phosphatase: 215 U/L — ABNORMAL HIGH (ref 38–126)
Anion gap: 7 (ref 5–15)
BILIRUBIN TOTAL: 0.5 mg/dL (ref 0.3–1.2)
BUN: 21 mg/dL — AB (ref 6–20)
CO2: 32 mmol/L (ref 22–32)
Calcium: 8.3 mg/dL — ABNORMAL LOW (ref 8.9–10.3)
Chloride: 96 mmol/L — ABNORMAL LOW (ref 101–111)
Creatinine, Ser: 0.69 mg/dL (ref 0.61–1.24)
Glucose, Bld: 115 mg/dL — ABNORMAL HIGH (ref 65–99)
POTASSIUM: 4.2 mmol/L (ref 3.5–5.1)
Sodium: 135 mmol/L (ref 135–145)
TOTAL PROTEIN: 6.7 g/dL (ref 6.5–8.1)

## 2017-05-28 LAB — PHOSPHORUS: PHOSPHORUS: 5.1 mg/dL — AB (ref 2.5–4.6)

## 2017-05-28 LAB — GLUCOSE, CAPILLARY
GLUCOSE-CAPILLARY: 127 mg/dL — AB (ref 65–99)
GLUCOSE-CAPILLARY: 138 mg/dL — AB (ref 65–99)
GLUCOSE-CAPILLARY: 145 mg/dL — AB (ref 65–99)
GLUCOSE-CAPILLARY: 156 mg/dL — AB (ref 65–99)
GLUCOSE-CAPILLARY: 165 mg/dL — AB (ref 65–99)

## 2017-05-28 LAB — MAGNESIUM: MAGNESIUM: 2 mg/dL (ref 1.7–2.4)

## 2017-05-28 MED ORDER — ACETAMINOPHEN 10 MG/ML IV SOLN
1000.0000 mg | Freq: Four times a day (QID) | INTRAVENOUS | Status: AC
  Administered 2017-05-28 – 2017-05-29 (×4): 1000 mg via INTRAVENOUS
  Filled 2017-05-28 (×4): qty 100

## 2017-05-28 MED ORDER — DEXMEDETOMIDINE HCL IN NACL 200 MCG/50ML IV SOLN
0.4000 ug/kg/h | INTRAVENOUS | Status: AC
  Administered 2017-05-28: 0.4 ug/kg/h via INTRAVENOUS
  Administered 2017-05-29 (×3): 0.7 ug/kg/h via INTRAVENOUS
  Administered 2017-05-29: 0.5 ug/kg/h via INTRAVENOUS
  Filled 2017-05-28 (×5): qty 50

## 2017-05-28 MED ORDER — TRACE MINERALS CR-CU-MN-SE-ZN 10-1000-500-60 MCG/ML IV SOLN
INTRAVENOUS | Status: AC
  Administered 2017-05-28: 18:00:00 via INTRAVENOUS
  Filled 2017-05-28: qty 1000

## 2017-05-28 MED ORDER — HYDROMORPHONE BOLUS VIA INFUSION
1.0000 mg | INTRAVENOUS | Status: DC | PRN
Start: 2017-05-28 — End: 2017-06-27
  Administered 2017-05-28 – 2017-06-18 (×21): 1 mg via INTRAVENOUS
  Filled 2017-05-28: qty 1

## 2017-05-28 NOTE — Progress Notes (Signed)
[facility] Note  ADMISSION DATE:  05/06/2017  CONSULTATION DATE:  05/13/17  REFERRING MD:  Kieth Brightly  CHIEF COMPLAINT:  Vent Management  Presenting HPI:  56 y.o. 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 On 7/18, he developed a leak therefore was taken back to OR for re-do of ex-lap and repair of leak.  He returned to the ICU on the vent and PCCM was consulted for vent management.  Subjective:   Tmax 101.1, WBC 14.2.  RN reports thick secretions overnight.  Pt weaning on 5/5, 30%.  On propofol 50 mcg, Dilaudid at 63m/hr.  Versed gtt off.  No acute events overnight.  Wife reports pt had episodes of sweating after infusion of IV tylenol    Vent Mode: PRVC FiO2 (%):  [30 %] 30 % Set Rate:  [16 bmp-18 bmp] 18 bmp Vt Set:  [580 mL] 580 mL PEEP:  [5 cmH20] 5 cmH20 Pressure Support:  [10 cmH20-15 cmH20] 15 cmH20 Plateau Pressure:  [18 cmH20-19 cmH20] 18 cmH20  Temp:  [99.1 F (37.3 C)-101.1 F (38.4 C)] 99.4 F (37.4 C) (08/02 0400) Pulse Rate:  [89-119] 103 (08/02 0800) Resp:  [16-34] 22 (08/02 0800) BP: (107-148)/(57-79) 116/71 (08/02 0800) SpO2:  [95 %-100 %] 100 % (08/02 0800) FiO2 (%):  [30 %] 30 % (08/02 0305)  General: ill appearing adult male in NAD on vent HEENT: MM pink/moist, ETT, NGT sutured in place Neuro: awake, alert, nods appropriately, generalized weakness CV: s1s2 rrr, no m/r/g PULM: even/non-labored, lungs bilaterally clear anterior, diminished bibasilar GI: RUQ VAC intact, midline large catheter x2 with small amt brown drainage at insertion site, JP drain with pink/brown drainage in bulb Extremities: warm/dry, no edema  Skin: no rashes or lesions, small amt erythema on upper chest    CBC Latest Ref Rng & Units 05/27/2017 05/26/2017 05/25/2017  WBC 4.0 -  10.5 K/uL 14.2(H) 15.8(H) 14.5(H)  Hemoglobin 13.0 - 17.0 g/dL 7.1(L) 7.6(L) 7.4(L)  Hematocrit 39.0 - 52.0 % 21.6(L) 23.6(L) 22.5(L)  Platelets 150 - 400 K/uL 847(H) 894(H) 857(H)   BMP Latest Ref Rng & Units 05/28/2017 05/27/2017 05/26/2017  Glucose 65 - 99 mg/dL 115(H) 130(H) 129(H)  BUN 6 - 20 mg/dL 21(H) 23(H) 20  Creatinine 0.61 - 1.24 mg/dL 0.69 0.69 0.78  Sodium 135 - 145 mmol/L 135 133(L) 132(L)  Potassium 3.5 - 5.1 mmol/L 4.2 4.0 3.5  Chloride 101 - 111 mmol/L 96(L) 96(L) 92(L)  CO2 22 - 32 mmol/L 32 31 31  Calcium 8.9 - 10.3 mg/dL 8.3(L) 8.3(L) 8.0(L)   Hepatic Function Latest Ref Rng & Units 05/28/2017 05/26/2017 05/25/2017  Total Protein 6.5 - 8.1 g/dL 6.7 6.6 6.1(L)  Albumin 3.5 - 5.0 g/dL 1.7(L) 1.7(L) 1.6(L)  AST 15 - 41 U/L 38 24 20  ALT 17 - 63 U/L 18 12(L) 12(L)  Alk Phosphatase 38 - 126 U/L 215(H) 97 80  Total Bilirubin 0.3 - 1.2 mg/dL 0.5 0.3 0.3    IMAGING/STUDIES: CT ABD/PELVIS W/ CONTRAST 7/18: 1. Diffuse dilatation of the small greater the large bowel with fluid levels throughout the small and large bowel, most compatible with adynamic ileus in the postoperative state. No bowel wall thickening. No discrete small bowel caliber transition to suggest obstruction. 2. Pneumoperitoneum in the right upper quadrant is presumably due to recent surgery . 3. Small  postoperative fluid collection at the right adrenalectomy site without wall thickening or internal gas. 4. Small dependent right pleural effusion. 5. Patchy bibasilar lung consolidation and ground-glass attenuation with some volume loss, concerning for multilobar pneumonia and/or aspiration with some atelectasis.  VENOUS DUPLEX 7/25 >> negative for DVT, superficial vein thrombosis in the cephalic vein  MICROBIOLOGY: MRSA PCR 7/11:  Negative Abdominal Wound Culture 7/18:  Klebsiella pneumoniae (resistant to Ampicillin) Blood Cultures x2 7/19: negative  Tracheal Aspirate Culture 7/22: multiple organisms present,  none predominant BAL 7/27 >> MSSA  BC 7/27 >> MRSE 1/2 > contaminant ?  ANTIBIOTICS:  PCN ALLERGIC Aztreonam 7/17 (x1 dose) Vancomycin 7/17 - 7/21 Cefeime 7/17 >>> 7/31   Eraxis 7/19 >>> 7/21, 7/26 >> 8/1 Flagyl 7/20 >>> 7/30 Vancomycin 7/30 >> 7/31 Ancef 7/31 >>  LINES/TUBES: OETT 07/18 >>>  RUE PICC 7/20 >>>  RAD ART LINE 7/18 >>> 7/23 Foley 7/28 >>>  SIGNIFICANT EVENTS: 07/11 - Admit for laparoscopic converted to open right adrenal ectomy w/  Small bowel resection and reanastamosis. Wound vac placed. 07/18 - Found to have leak >> taken back to OR for re-do exploratory laparotomy. Returned to ICU on vent. 07/19 - NGT pulled out and almost self-extubated. Developed cuff leak >> tube exchanged. Back to OR emergently for intestinal leak/ischemic bowel. 07/22 - Back to OR for wash out >> left with open abdomen & wound vac in place. Transfused 1u PRBC. 07/23 - Intermittent agitation, sedation adjusted  07/25 - rising temp, WBC increased. Returned to OR for closure 07/27 - Recultured, sedation down on precedex 07/30 - Remains on sedation, VAC leak over the weekend with dressing [facility] 07/31 - Failed SBT > tachypnea/anxiety 08/01 - Weaned 10/5     ASSESSMENT/PLAN:  56 y.o. male admitted with adrenal mass positive for metanephrine's s/p right adrenalectomy complicated by small bowel injury in the setting of adhesions with sepsis secondary to intra-abdominal infection. Intubated  X 13 days , requirnig multiple sedatives (chronic ativan & percocet use)  Sepsis - anastomotic leak resulting in peritonitis. MSSA HCAP    Fever - intermittent, ongoing.   P: Abx as above  Completed 14 days cefepime, 7 days Eraxis Continue Ancef, D4/x  Monitor fever curve / WBC trend    Acute hypoxic respiratory failure: in setting of sepsis, post-op adrenalectomy. 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 Remains sedated / intubated   Family Update: Wife updated at bedside am 8/2     CC Time: 52 minutes  Noe Gens, NP-C [facility] Pgr:  [phone] or if no answer [phone] 05/28/2017, 8:14 AM

## 2017-05-28 NOTE — Progress Notes (Signed)
PT Cancellation Note  Patient Details Name: [name] [name] MRN: [id number] DOB: May 08, 1961    Cancelled Treatment:    Reason Eval/Treat Not Completed: Patient not medically ready. Just extubated. Will check back another time.   Marcelino Freestone PT [phone]  05/28/2017, 10:11 AM

## 2017-05-28 NOTE — Progress Notes (Signed)
Progress Note: General Surgery Service   Assessment/Plan: Patient Active Problem List   Diagnosis Date Noted  . Mucus plugging of bronchi   . Acute respiratory failure with hypoxemia ([place])   . Ileus ([place])   . Hypokalemia 05/14/2017  . Anastomotic leak of intestine 05/14/2017  . Severe sepsis ([place]) 05/14/2017  . Wound dehiscence, surgical 05/14/2017  . Aspiration pneumonia ([place]) 05/14/2017  . Chronic narcotic use 05/10/2017  . Anxiety state 05/10/2017  . Intra-abdominal adhesions s/p SB resection 05/06/2017 05/09/2017  . 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 Right adrenal mass s/p adrenalectomy 05/06/2017 05/06/2017  . Throat symptom 03/18/2016  . Multinodular goiter 01/19/2016  . Hyperthyroidism 01/19/2016  . Essential hypertension   . Diarrhea 11/30/2014  . Anemia, iron deficiency 11/01/2014  . Leukocytosis 11/03/2013  . Tobacco abuse 07/26/2013  . COPD (chronic obstructive pulmonary disease) ([place]) 07/26/2013  . Left knee pain 07/26/2013  . Pain in joint, shoulder region 05/03/2013  . GERD 07/24/2008  . 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   s/p Procedure(s): ABDOMINAL WOUND [place] OUT, WOUND CLOSURE AND WOUND VAC PLACEMENT 05/20/2017 -able to get off versed -reorder 24h ofirmev -f/u ccm weaning and recs    LOS: 22 days  Chief Complaint/Subjective: Required multiple dilaudid boluses overnight  Objective: Vital signs in last 24 hours: Temp:  [99.1 F (37.3 C)-101.1 F (38.4 C)] 99.4 F (37.4 C) (08/02 0400) Pulse Rate:  [89-119] 103 (08/02 0800) Resp:  [16-34] 22 (08/02 0800) BP: (107-148)/(57-79) 116/71 (08/02 0800) SpO2:  [95 %-100 %] 100 % (08/02 0800) FiO2 (%):  [30 %] 30 % (08/02 0305) Last BM Date:  (ileal tubes)  Intake/Output from previous day: 08/01 0701 - 08/02 0700 In: 3210 [I.V.:3210] Out: 3410 [Urine:1750; Emesis/NG output:1325; Drains:335] Intake/Output this shift: No intake/output data recorded.  Lungs: coarse  b/l  Cardiovascular: tachycardic  Abd: soft, vac functional, thin dark liquid from proximal ileal tube, thick drainage from blek drain  Extremities: trace edema  Neuro: GCS 11t  Lab Results: CBC   Recent Labs  05/26/17 0414 05/27/17 0441  WBC 15.8* 14.2*  HGB 7.6* 7.1*  HCT 23.6* 21.6*  PLT 894* 847*   BMET  Recent Labs  05/27/17 0441 05/28/17 0211  NA 133* 135  K 4.0 4.2  CL 96* 96*  CO2 31 32  GLUCOSE 130* 115*  BUN 23* 21*  CREATININE 0.69 0.69  CALCIUM 8.3* 8.3*   PT/INR No results for input(s): LABPROT, INR in the last 72 hours. ABG No results for input(s): PHART, HCO3 in the last 72 hours.  Invalid input(s): PCO2, PO2  Studies/Results:  Anti-infectives: Anti-infectives    Start     Dose/Rate Route Frequency Ordered Stop   05/26/17 2200  ceFAZolin (ANCEF) IVPB 1 g/50 mL premix     1 g 100 mL/hr over 30 Minutes Intravenous Every 8 hours 05/26/17 1008     05/25/17 1100  vancomycin (VANCOCIN) 1,250 mg in sodium chloride 0.9 % 250 mL IVPB  Status:  Discontinued     1,250 mg 166.7 mL/hr over 90 Minutes Intravenous Every 12 hours 05/25/17 0955 05/26/17 0946   05/22/17 1400  anidulafungin (ERAXIS) 100 mg in sodium chloride 0.9 % 100 mL IVPB     100 mg 78 mL/hr over 100 Minutes Intravenous Every 24 hours 05/21/17 1330 05/27/17 1740   05/21/17 1400  anidulafungin (ERAXIS) 200 mg in sodium chloride 0.9 % 200 mL IVPB     200 mg 78 mL/hr over 200 Minutes Intravenous  Once 05/21/17 1330 05/21/17 1940   05/15/17 1000  anidulafungin (ERAXIS) 100 mg in sodium chloride 0.9 % 100 mL IVPB  Status:  Discontinued     100 mg 78 mL/hr over 100 Minutes Intravenous Every 24 hours 05/14/17 0829 05/16/17 0901   05/15/17 1000  metroNIDAZOLE (FLAGYL) IVPB 500 mg  Status:  Discontinued     500 mg 100 mL/hr over 60 Minutes Intravenous Every 8 hours 05/15/17 0903 05/25/17 0938   05/14/17 0900  anidulafungin (ERAXIS) 200 mg in sodium chloride 0.9 % 200 mL IVPB     200 mg 78  mL/hr over 200 Minutes Intravenous  Once 05/14/17 0829 05/14/17 1325   05/13/17 1927  vancomycin (VANCOCIN) 1-5 GM/200ML-% IVPB    Comments:  Ward, Christa   : cabinet override      05/13/17 1927 05/14/17 0744   05/12/17 1400  ceFEPIme (MAXIPIME) 1 g in dextrose 5 % 50 mL IVPB     1 g 100 mL/hr over 30 Minutes Intravenous Every 8 hours 05/12/17 0939 05/26/17 2359   05/12/17 0900  vancomycin (VANCOCIN) IVPB 1000 mg/200 mL premix  Status:  Discontinued     1,000 mg 200 mL/hr over 60 Minutes Intravenous Every 12 hours 05/12/17 0750 05/16/17 0852   05/12/17 0830  aztreonam (AZACTAM) 2 GM IVPB     2 g 100 mL/hr over 30 Minutes Intravenous  Once 05/12/17 0800 05/12/17 0957   05/06/17 0916  vancomycin (VANCOCIN) IVPB 1000 mg/200 mL premix     1,000 mg 200 mL/hr over 60 Minutes Intravenous On call to O.R. 05/06/17 0916 05/06/17 1229      Medications: Scheduled Meds: . bisacodyl  10 mg Rectal Daily  . chlorhexidine gluconate (MEDLINE KIT)  15 mL Mouth Rinse BID  . Chlorhexidine Gluconate Cloth  6 each Topical Q0600  . enoxaparin (LOVENOX) injection  40 mg Subcutaneous Q24H  . insulin aspart  0-15 Units Subcutaneous Q6H  . ipratropium-albuterol  3 mL Nebulization Q6H  . lip balm  1 application Topical BID  . mouth rinse  15 mL Mouth Rinse 10 times per day  . nicotine  21 mg Transdermal Daily  . pantoprazole (PROTONIX) IV  40 mg Intravenous Q24H  . thiamine injection  100 mg Intravenous Daily   Continuous Infusions: . [place] KitchenTPN (CLINIMIX-E) Adult 120 mL/hr at 05/28/17 0600  .  ceFAZolin (ANCEF) IV Stopped (05/28/17 7673)  . dextrose 5 % and 0.45% NaCl 10 mL/hr at 05/28/17 0600  . HYDROmorphone 6 mg/hr (05/28/17 0600)  . midazolam (VERSED) infusion Stopped (05/27/17 1740)  . propofol (DIPRIVAN) infusion 50 mcg/kg/min (05/28/17 0624)  . TPN (CLINIMIX) Adult without lytes     PRN Meds:.acetaminophen, HYDROmorphone, labetalol, magic mouthwash, midazolam, ondansetron **OR** ondansetron (ZOFRAN)  IV, ondansetron (ZOFRAN) IV, sodium chloride flush  Mickeal Skinner, MD Pg# [phone] [facility]

## 2017-05-28 NOTE — Progress Notes (Signed)
Nutrition Follow-up  DOCUMENTATION CODES:   Not applicable  INTERVENTION:  - Continue TPN per Pharmacy. - RD will continue to monitor for nutrition-related plan.   NUTRITION DIAGNOSIS:   Inadequate oral intake related to inability to eat as evidenced by NPO status. -ongoing  GOAL:   Patient will meet greater than or equal to 90% of their needs -met with TPN regimen  MONITOR:   Weight trends, Labs, Skin, I & O's, Other (Comment) (TPN regimen)  ASSESSMENT:   56 y.o. 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 On 7/18, he developed a leak therefore was taken back to OR for re-do of ex-lap and repair of leak.  8/2 Pt was extubated shortly after 9AM today; estimated nutrition needs updated based on this event and continue to use weight of 89.4 kg to estimate needs. NGT remains in place with 550cc output in canister. RN, who is at bedside, reports 50cc of this is from this shift.   Pt with triple lumen RIJ and is receiving Clinimix E 5/15 @ 120 mL/hr. Pharmacy note from this AM states plan to switch to Clinimix 5/20 @ 115 mL/hr and withhold ILE d/t high Propofol requirement; Propofol now off. Will monitor for plan concerning ILE. Clinimix 5/20 @ 115 mL/hr will provide 138 grams of protein and 2429 kcal to meet 100% re-estimated kcal and protein need.  Medications reviewed; 10 mg rectal Dulcolax/day, sliding scale Novolog, 40 mg IV Protonix/day, 100 mg IV thiamine/day.  Labs reviewed; CBGs: 138 and 127 mg/dL this AM, Cl: 96 mmol/L, BUN: 21 mg/dL, Phos: 5.1 mg/dL, Ca: 8.3 mg/dL.   IVF: D5-1/2 NS @ 10 mL/hr (41 kcal from dextrose).  Drip: Dilaudid @ 5 mg/hr.    7/30 - Pt continues with NGT and remains intubated.  - Estimated kcal need updated based on medical course.  - Pt had bronch on 7/27 afternoon. -  Notes indicate wound vac leak over the weekend which has now been resolved.  - CCM team has begun discussion with wife concerning trach placement.  - Unsuccessful with increased WOB/agitation.  - Acute anasarca which has been improving with addition of Lasix drip. No new weight since 7/26; will request RN re-weigh pt today as time permits. - Spoke with Pharmacist about TPN regimen.  - Pt current receiving Clinimix 5/20 @ 115 mL/hr which is providing 138 grams of protein (86% estimated protein need) and 1960 kcal (83% estimated kcal need).  - Plan to switch to Clinimix E 5/15 (add back lytes given current BMP) @ 120 mL/hr (2880 mL/day) with 20% ILE @ 20 mL/hr x12 hours on MWF. This will provide a daily average of 144 grams of protein (90% estimated protein need) and 2251 kcal (95% estimated kcal need).  - Pending fluid status, eventual goal for Clinimix E 5/15 @ 125 mL/hr (3000 mL/day) with 20% ILE @ 20 mL/hr x12 hours MWF. T - his regimen will provide a daily average of 150 grams of protein (94% estimated protein need) and 2336 kcal (99% estimated kcal need).  Patient is currently intubated on ventilator support MV: 17.1 L/min Temp (24hrs), Avg:100.2 F (37.9 C), Min:99 F (37.2 C), Max:100.8 F (38.2 C) Propofol: 1.1 ml/hr (29 kcal from fat) BP: 128/74 and MAP: 87 IVF: D5-1/2 NS @ 10 mL/hr (41 kcal from dextrose). Drips: Dilaudid @ 5 mg/hr, Versed @  1 mg/hr, Propofol @ 2 mcg/kg/min, Lasix @ 5 mg/hr.    *SEE CHART FOR PREVIOUS RD NOTES    Diet Order:  .TPN (CLINIMIX-E) Adult TPN (CLINIMIX) Adult without lytes Diet NPO time specified  Skin:  Wound (see comment) (Incisions to R flank and abdomen from 7/11 and 7/18), 7/19, 7/22, and 7/25  Last BM:  8/2  Height:   Ht Readings from Last 1 Encounters:  05/26/17 '5\' 10"'  (1.778 m)    Weight:   Wt Readings from Last 1 Encounters:  05/26/17 205 lb 11 oz (93.3 kg)    Ideal Body Weight:  75.45 kg  BMI:  Body mass index is 29.51  kg/m.  Estimated Nutritional Needs:   Kcal:  2235-2505 (25-28 kcal/kg)  Protein:  134-152 grams (1.5-1.7 grams/kg)  Fluid:  2.4 L/day  EDUCATION NEEDS:   No education needs identified at this time    Jarome Matin, MS, RD, LDN, CNSC Inpatient Clinical Dietitian Pager # [phone] After hours/weekend pager # [phone]

## 2017-05-28 NOTE — Progress Notes (Signed)
Date:  May 28, 2017 Chart reviewed for concurrent status and case management needs. Will continue to follow patient progress./Extubated to 3liter [place] this am tolerating well at this time. NGtube sutured in place/remains npo./iv flds/iv tpn Discharge Planning: following for needs Expected discharge date: [id number] [name], BSN, [place], [place]

## 2017-05-28 NOTE — Procedures (Signed)
Extubation Procedure Note  Patient Details:   Name: [name] [name] DOB: Mar 04, 1961 MRN: [id number]   Airway Documentation:     Evaluation  O2 sats: stable throughout Complications: No apparent complications Patient did tolerate procedure well. Bilateral Breath Sounds: Diminished, Rhonchi   Yes  [name] 05/28/2017, 9:09 AM

## 2017-05-28 NOTE — Progress Notes (Signed)
[place] NOTE   Pharmacy Consult for TPN Indication: prolonged ileus, small bowel leak, multiple bowel surgeries  Patient Measurements: Body mass index is 29.51 kg/m. Filed Weights   05/21/17 0555 05/21/17 1111 05/26/17 0430  Weight: 208 lb 8.9 oz (94.6 kg) 208 lb 8.9 oz (94.6 kg) 205 lb 11 oz (93.3 kg)   HPI: 23 yoM admitted on 7/11 for enlarging adrenal mass and planned adrenalectomy.  Pharmacy is now consulted to dose TPN.  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  Planning for return to OR in 48-72h 7/22 OR:  wash out, tube ileostomy, left with open abdomen & wound vac in place.  Plan for OR on 7/25 for closure 7/25 OR: mesh placed for abdominal closure, wound vac  Insulin requirements past 24 hours: 8 units SSI (no hx DM)  Current Nutrition: NPO  IVF: D5 1/2NS at10 ml/hr  Central access: PICC line ordered 7/19, placed on 7/20 TPN start date: 7/20  ASSESSMENT                                                                                                           Today, 05/28/17 - Glucose: within goal < 150, range 120-139 (trending down with change to 5/15 formulation on 7/30) - Electrolytes:   Na had been low but now within normal range  K now in normal range after several days of supplementation  Corrected calcium WNL  Mg in normal range  Phos now elevated - Renal:  SCr wnl, stable.  Lasix continuous infusion 7/29-7/31 per PCCM d/t high volume TPN + continuous drips/abx. 8/1 I/O: 3210/3410 net -267mL - Weight: most recent 7/31 93.3kg down from 94.6kg on  7/26 - LFTs: WNL, Tbili WNL - TGs:  296 (7/18), 266 (7/20), 269 (7/23), 210 (7/28) - propofol restarted overnight 7/30 - required up to 50 mcg/kg/min (31.2 kcal/hr). TG increased to 285 (7/31) - Prealbumin:  <5 (7/20), 6.1 (7/24), 10.1 (7/30)  NUTRITIONAL GOALS                                                                                             RD recs (7/27):  160 Protein (1.8 g/kg of usual body weight 89.4kg), 2300-2530 Kcal  Clinimix 5/15 at a max fluid volume of 3L at 125 ml/hr  + 20% fat emulsion 240 ml/day to provide: 150  g of protein and 2610 kCals per day meeting 94% of protein and >100% of kCal needs. - Glucose infusion rate will be 3.2 mg/kg/min (Maximum 5 mg/kg/min) - Using Lipids only 3 times/week on MWF would provide: 150 g of protein and an average of 2336 kCals per day meeting 94% of protein and 100 % of kCal needs.  When using the 5/20 formulation (which is the only available electrolyte-free formulation available), a  rate of 115 ml/hr with lipids 3x/week on MWF would provide: 138g protein and avg 2174 kcal/day   PLAN                                                                                                                          At 1800 today:  Change Clinimix to electrolyte-free formulation due to elevated phosphorus: 5/20 and decrease rate to at 115 ml/hr   Withhold lipids today due to high propofol requirements  TPN to contain standard multivitamins and trace elements daily  Continue IVF at [place] and moderate SSI q6h.  TPN lab panels on Mondays & Thursdays.  Monitor I/O with high volume TPN   Peggyann Juba, PharmD, BCPS Pager: [phone] 05/28/2017 7:29 AM

## 2017-05-28 NOTE — Progress Notes (Signed)
[place] Progress Note Patient Name: [name] [name] DOB: [birth year] MRN: [id number]   Date of Service  05/28/2017  HPI/Events of Note  Nurse reports patient's pain now 5/10 but he feels "anxious".  eICU Interventions  Starting Precedex gtt     Intervention Category Intermediate Interventions: Other:  Tera Partridge 05/28/2017, 10:15 PM

## 2017-05-29 ENCOUNTER — Inpatient Hospital Stay (HOSPITAL_COMMUNITY): Payer: Medicare Other

## 2017-05-29 DIAGNOSIS — L8990 Pressure ulcer of unspecified site, unspecified stage: Secondary | ICD-10-CM | POA: Insufficient documentation

## 2017-05-29 LAB — BASIC METABOLIC PANEL
Anion gap: 8 (ref 5–15)
BUN: 18 mg/dL (ref 6–20)
CHLORIDE: 98 mmol/L — AB (ref 101–111)
CO2: 28 mmol/L (ref 22–32)
Calcium: 8.3 mg/dL — ABNORMAL LOW (ref 8.9–10.3)
Creatinine, Ser: 0.54 mg/dL — ABNORMAL LOW (ref 0.61–1.24)
GFR calc non Af Amer: >60 mL/min (ref >60)
Glucose, Bld: 161 mg/dL — ABNORMAL HIGH (ref 65–99)
Potassium: 3.8 mmol/L (ref 3.5–5.1)
SODIUM: 134 mmol/L — AB (ref 135–145)

## 2017-05-29 LAB — MAGNESIUM: MAGNESIUM: 1.8 mg/dL (ref 1.7–2.4)

## 2017-05-29 LAB — CBC
HCT: 21.6 % — ABNORMAL LOW (ref 39.0–52.0)
HEMOGLOBIN: 7.2 g/dL — AB (ref 13.0–17.0)
MCH: 27.1 pg (ref 26.0–34.0)
MCHC: 33.3 g/dL (ref 30.0–36.0)
MCV: 81.2 fL (ref 78.0–100.0)
Platelets: 852 10*3/uL — ABNORMAL HIGH (ref 150–400)
RBC: 2.66 MIL/uL — ABNORMAL LOW (ref 4.22–5.81)
RDW: 15.7 % — AB (ref 11.5–15.5)
WBC: 16.3 10*3/uL — ABNORMAL HIGH (ref 4.0–10.5)

## 2017-05-29 LAB — GLUCOSE, CAPILLARY
GLUCOSE-CAPILLARY: 185 mg/dL — AB (ref 65–99)
GLUCOSE-CAPILLARY: 126 mg/dL — AB (ref 65–99)
Glucose-Capillary: 150 mg/dL — ABNORMAL HIGH (ref 65–99)
Glucose-Capillary: 112 mg/dL — ABNORMAL HIGH (ref 65–99)

## 2017-05-29 LAB — PHOSPHORUS: PHOSPHORUS: 3.1 mg/dL (ref 2.5–4.6)

## 2017-05-29 LAB — TRIGLYCERIDES: TRIGLYCERIDES: 301 mg/dL — AB (ref <150)

## 2017-05-29 MED ORDER — OXYCODONE HCL 5 MG/5ML PO SOLN
10.0000 mg | ORAL | Status: DC
  Administered 2017-05-29 – 2017-06-27 (×151): 10 mg
  Filled 2017-05-29 (×157): qty 10

## 2017-05-29 MED ORDER — CEFEPIME HCL 2 G IJ SOLR
2.0000 g | Freq: Three times a day (TID) | INTRAMUSCULAR | Status: DC
  Administered 2017-05-29 – 2017-06-03 (×14): 2 g via INTRAVENOUS
  Filled 2017-05-29 (×15): qty 2

## 2017-05-29 MED ORDER — ORAL CARE MOUTH RINSE: 15.0000 mL | Freq: Two times a day (BID) | OROMUCOSAL | Status: DC

## 2017-05-29 MED ORDER — CHLORHEXIDINE GLUCONATE 0.12 % MT SOLN
15.0000 mL | Freq: Two times a day (BID) | OROMUCOSAL | Status: DC
  Administered 2017-05-29 (×2): 15 mL via OROMUCOSAL

## 2017-05-29 MED ORDER — IOPAMIDOL (ISOVUE-300) INJECTION 61%
INTRAVENOUS | Status: AC
  Filled 2017-05-29: qty 100

## 2017-05-29 MED ORDER — PHENOL 1.4 % MT LIQD
1.0000 | OROMUCOSAL | Status: DC | PRN
  Administered 2017-05-31 – 2017-06-09 (×3): 1 via OROMUCOSAL
  Filled 2017-05-29 (×2): qty 177

## 2017-05-29 MED ORDER — FAT EMULSION 20 % IV EMUL
240.0000 mL | INTRAVENOUS | Status: AC
  Administered 2017-05-29: 240 mL via INTRAVENOUS
  Filled 2017-05-29: qty 250

## 2017-05-29 MED ORDER — IOPAMIDOL (ISOVUE-300) INJECTION 61%
100.0000 mL | Freq: Once | INTRAVENOUS | Status: AC | PRN
  Administered 2017-05-29: 100 mL via INTRAVENOUS

## 2017-05-29 MED ORDER — IOPAMIDOL (ISOVUE-300) INJECTION 61%
15.0000 mL | Freq: Once | INTRAVENOUS | Status: DC | PRN
  Administered 2017-05-29: 15 mL via ORAL
  Filled 2017-05-29: qty 30

## 2017-05-29 MED ORDER — IOPAMIDOL (ISOVUE-300) INJECTION 61%
INTRAVENOUS | Status: AC
  Filled 2017-05-29: qty 30

## 2017-05-29 MED ORDER — TRACE MINERALS CR-CU-MN-SE-ZN 10-1000-500-60 MCG/ML IV SOLN
INTRAVENOUS | Status: AC
  Administered 2017-05-29: 18:00:00 via INTRAVENOUS
  Filled 2017-05-29: qty 1000

## 2017-05-29 MED ORDER — METRONIDAZOLE IN NACL 5-0.79 MG/ML-% IV SOLN
500.0000 mg | Freq: Three times a day (TID) | INTRAVENOUS | Status: DC
  Administered 2017-05-29 – 2017-06-03 (×14): 500 mg via INTRAVENOUS
  Filled 2017-05-29 (×15): qty 100

## 2017-05-29 MED ORDER — LORAZEPAM 0.5 MG PO TABS: 0.5000 mg | ORAL_TABLET | Freq: Two times a day (BID) | ORAL | Status: DC

## 2017-05-29 MED ORDER — MENTHOL 3 MG MT LOZG: 1.0000 | LOZENGE | OROMUCOSAL | Status: DC | PRN

## 2017-05-29 MED ORDER — LORAZEPAM 2 MG/ML IJ SOLN
0.5000 mg | Freq: Two times a day (BID) | INTRAMUSCULAR | Status: DC
  Administered 2017-05-29 – 2017-05-31 (×6): 0.5 mg via INTRAVENOUS
  Filled 2017-05-29 (×6): qty 1

## 2017-05-29 NOTE — Progress Notes (Signed)
OT Cancellation Note  Patient Details Name: [name] [name] MRN: [id number] DOB: September 06, 1961   Cancelled Treatment:    Reason Eval/Treat Not Completed: Other (comment).  Pt is prepping for CT scan. Will check back another day  [name] [name] 05/29/2017, 2:47 PM  [name], OTR/L [phone] 05/29/2017

## 2017-05-29 NOTE — Progress Notes (Signed)
Progress Note: General Surgery Service   Assessment/Plan: Patient Active Problem List   Diagnosis Date Noted  . Pressure injury of skin 05/29/2017  . Mucus plugging of bronchi   . Acute respiratory failure with hypoxemia ([place])   . Ileus ([place])   . Hypokalemia 05/14/2017  . Anastomotic leak of intestine 05/14/2017  . Severe sepsis ([place]) 05/14/2017  . Wound dehiscence, surgical 05/14/2017  . Aspiration pneumonia ([place]) 05/14/2017  . Chronic narcotic use 05/10/2017  . Anxiety state 05/10/2017  . Intra-abdominal adhesions s/p SB resection 05/06/2017 05/09/2017  . H/O total adrenalectomy ([place]) 05/06/2017  . Right adrenal mass s/p adrenalectomy 05/06/2017 05/06/2017  . Throat symptom 03/18/2016  . Multinodular goiter 01/19/2016  . Hyperthyroidism 01/19/2016  . Essential hypertension   . Diarrhea 11/30/2014  . Anemia, iron deficiency 11/01/2014  . Leukocytosis 11/03/2013  . Tobacco abuse 07/26/2013  . COPD (chronic obstructive pulmonary disease) ([place]) 07/26/2013  . Left knee pain 07/26/2013  . Pain in joint, shoulder region 05/03/2013  . GERD 07/24/2008  . 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   s/p Procedure(s): ABDOMINAL WOUND [place] OUT, WOUND CLOSURE AND WOUND VAC PLACEMENT 05/20/2017  -add oxycodone scheduled per tube -will hold on vac change until tomorrow given recent extubation -continue drains, blake drain with thick brown liquid, if WBC up tomorrow will get CT for being ~1wk from closure   LOS: 23 days  Chief Complaint/Subjective: Extubated yesterday, breathing well  Objective: Vital signs in last 24 hours: Temp:  [98 F (36.7 C)-100.6 F (38.1 C)] 98.1 F (36.7 C) (08/03 0755) Pulse Rate:  [82-114] 87 (08/03 0600) Resp:  [24-37] 25 (08/03 0600) BP: (113-162)/(69-89) 133/77 (08/03 0600) SpO2:  [96 %-100 %] 100 % (08/03 0744) FiO2 (%):  [2 %-30 %] 2 % (08/02 1400) Last BM Date:  (ileal tubes)  Intake/Output from previous day: 08/02 0701 - 08/03  0700 In: 4093 [I.V.:3873; IV Piggyback:200] Out: 3440 [Urine:1900; Emesis/NG output:950; Drains:590] Intake/Output this shift: No intake/output data recorded.  Lungs: CTAB  Cardiovascular: tachycardic  Abd: soft, min tenderness, vac functional, bulb with thick brown fluid, minimal drainage from foley tubes  Extremities: no edema  Neuro: GCS 15  Lab Results: CBC   Recent Labs  05/27/17 0441 05/29/17 0316  WBC 14.2* 16.3*  HGB 7.1* 7.2*  HCT 21.6* 21.6*  PLT 847* 852*   BMET  Recent Labs  05/28/17 0211 05/29/17 0316  NA 135 134*  K 4.2 3.8  CL 96* 98*  CO2 32 28  GLUCOSE 115* 161*  BUN 21* 18  CREATININE 0.69 0.54*  CALCIUM 8.3* 8.3*   PT/INR No results for input(s): LABPROT, INR in the last 72 hours. ABG No results for input(s): PHART, HCO3 in the last 72 hours.  Invalid input(s): PCO2, PO2  Studies/Results:  Anti-infectives: Anti-infectives    Start     Dose/Rate Route Frequency Ordered Stop   05/26/17 2200  ceFAZolin (ANCEF) IVPB 1 g/50 mL premix     1 g 100 mL/hr over 30 Minutes Intravenous Every 8 hours 05/26/17 1008     05/25/17 1100  vancomycin (VANCOCIN) 1,250 mg in sodium chloride 0.9 % 250 mL IVPB  Status:  Discontinued     1,250 mg 166.7 mL/hr over 90 Minutes Intravenous Every 12 hours 05/25/17 0955 05/26/17 0946   05/22/17 1400  anidulafungin (ERAXIS) 100 mg in sodium chloride 0.9 % 100 mL IVPB     100 mg 78 mL/hr over 100 Minutes Intravenous Every 24 hours 05/21/17 1330 05/27/17 1740  05/21/17 1400  anidulafungin (ERAXIS) 200 mg in sodium chloride 0.9 % 200 mL IVPB     200 mg 78 mL/hr over 200 Minutes Intravenous  Once 05/21/17 1330 05/21/17 1940   05/15/17 1000  anidulafungin (ERAXIS) 100 mg in sodium chloride 0.9 % 100 mL IVPB  Status:  Discontinued     100 mg 78 mL/hr over 100 Minutes Intravenous Every 24 hours 05/14/17 0829 05/16/17 0901   05/15/17 1000  metroNIDAZOLE (FLAGYL) IVPB 500 mg  Status:  Discontinued     500 mg 100  mL/hr over 60 Minutes Intravenous Every 8 hours 05/15/17 0903 05/25/17 0938   05/14/17 0900  anidulafungin (ERAXIS) 200 mg in sodium chloride 0.9 % 200 mL IVPB     200 mg 78 mL/hr over 200 Minutes Intravenous  Once 05/14/17 0829 05/14/17 1325   05/13/17 1927  vancomycin (VANCOCIN) 1-5 GM/200ML-% IVPB    Comments:  Ward, Christa   : cabinet override      05/13/17 1927 05/14/17 0744   05/12/17 1400  ceFEPIme (MAXIPIME) 1 g in dextrose 5 % 50 mL IVPB     1 g 100 mL/hr over 30 Minutes Intravenous Every 8 hours 05/12/17 0939 05/26/17 2359   05/12/17 0900  vancomycin (VANCOCIN) IVPB 1000 mg/200 mL premix  Status:  Discontinued     1,000 mg 200 mL/hr over 60 Minutes Intravenous Every 12 hours 05/12/17 0750 05/16/17 0852   05/12/17 0830  aztreonam (AZACTAM) 2 GM IVPB     2 g 100 mL/hr over 30 Minutes Intravenous  Once 05/12/17 0800 05/12/17 0957   05/06/17 0916  vancomycin (VANCOCIN) IVPB 1000 mg/200 mL premix     1,000 mg 200 mL/hr over 60 Minutes Intravenous On call to O.R. 05/06/17 0916 05/06/17 1229      Medications: Scheduled Meds: . bisacodyl  10 mg Rectal Daily  . chlorhexidine  15 mL Mouth Rinse BID  . Chlorhexidine Gluconate Cloth  6 each Topical Q0600  . enoxaparin (LOVENOX) injection  40 mg Subcutaneous Q24H  . insulin aspart  0-15 Units Subcutaneous Q6H  . ipratropium-albuterol  3 mL Nebulization Q6H  . lip balm  1 application Topical BID  . mouth rinse  15 mL Mouth Rinse q12n4p  . nicotine  21 mg Transdermal Daily  . oxyCODONE  10 mg Per Tube Q4H  . pantoprazole (PROTONIX) IV  40 mg Intravenous Q24H  . thiamine injection  100 mg Intravenous Daily   Continuous Infusions: .  ceFAZolin (ANCEF) IV Stopped (05/29/17 2426)  . dexmedetomidine (PRECEDEX) IV infusion 0.7 mcg/kg/hr (05/29/17 8341)  . dextrose 5 % and 0.45% NaCl 10 mL/hr at 05/29/17 0530  . HYDROmorphone 5 mg/hr (05/29/17 0530)  . TPN (CLINIMIX) Adult without lytes 115 mL/hr at 05/29/17 0530   PRN  Meds:.acetaminophen, HYDROmorphone, labetalol, magic mouthwash, ondansetron **OR** ondansetron (ZOFRAN) IV, ondansetron (ZOFRAN) IV, sodium chloride flush  Mickeal Skinner, MD Pg# [phone] [facility]

## 2017-05-29 NOTE — Evaluation (Signed)
Physical Therapy Evaluation Patient Details Name: [name] [name] MRN: [id number] DOB: Jun 13, 1961 Today's Date: 05/29/2017   History of Present Illness   56 y.o. 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 On 7/18, he developed a leak therefore was taken back to OR for re-do of ex-lap and repair of leak on 05/20/17.  He returned to the ICU on the vent . Extubated on 05/28/17.  Clinical Impression  The patient has not been out of bed since 05/06/17 upon admission. The patient was able to tolerate standing and taking a few steps to recliner with RW and 2 person moderate  Assist. KNees did buckle initially. Pt admitted with above diagnosis. Pt currently with functional limitations due to the deficits listed below (see PT Problem List).  Pt will benefit from skilled PT to increase their independence and safety with mobility to allow discharge to the venue listed below.      Follow Up Recommendations Home health PT;Supervision/Assistance - 24 hour ,CIR    Equipment Recommendations   (TBD)    Recommendations for Other Services Rehab consult     Precautions / Restrictions Precautions Precautions: Fall Precaution Comments: multiple lines in abdomen, VAC and NG      Mobility  Bed Mobility Overal bed mobility: Needs Assistance Bed Mobility: Supine to Sit     Supine to sit: Mod assist;HOB elevated     General bed mobility comments: assist with trunk, self assisted legs over bed edge  Transfers Overall transfer level: Needs assistance Equipment used: Rolling walker (2 wheeled) Transfers: Stand Pivot Transfers Sit to Stand: Mod assist;+2 physical assistance;+2 safety/equipment         General transfer comment: safety support at the knees as noted to buckle when stood first time.   Patient stood x 1 minute,  sat to rest. Assited to stand at [facility] with 2 assist. Small shuffle steps to  turn to recliner. Cues to reach back to recliner.   Ambulation/Gait                Stairs            Wheelchair Mobility    Modified Rankin (Stroke Patients Only)       Balance Overall balance assessment: Needs assistance Sitting-balance support: Feet supported;Bilateral upper extremity supported Sitting balance-Leahy Scale: Fair     Standing balance support: During functional activity;Bilateral upper extremity supported Standing balance-Leahy Scale: Poor Standing balance comment: knees buckling                             Pertinent Vitals/Pain Pain Assessment: No/denies pain    Home Living Family/patient expects to be discharged to:: Private residence Living Arrangements: Spouse/significant other Available Help at Discharge: Family;Available 24 hours/day Type of Home: House Home Access: Stairs to enter Entrance Stairs-Rails: Right Entrance Stairs-Number of Steps: 5 Home Layout: Two level;Able to live on main level with bedroom/bathroom Home Equipment: Shower seat;Grab bars - toilet      Prior Function Level of Independence: Independent               Hand Dominance        Extremity/Trunk Assessment   Upper Extremity Assessment Upper Extremity Assessment: Defer to OT evaluation    Lower Extremity Assessment Lower Extremity Assessment: Generalized weakness  Cervical / Trunk Assessment Cervical / Trunk Assessment: Normal  Communication   Communication:  (voice very soft)  Cognition Arousal/Alertness: Awake/alert Behavior During Therapy: WFL for tasks assessed/performed;Flat affect Overall Cognitive Status: Difficult to assess                                 General Comments: does follow commands very quiet      General Comments      Exercises     Assessment/Plan    PT Assessment Patient needs continued PT services  PT  Problem List Decreased strength;Decreased activity tolerance;Decreased balance;Decreased mobility;Decreased knowledge of precautions;Decreased safety awareness;Decreased knowledge of use of DME       PT Treatment Interventions DME instruction;Gait training;Functional mobility training;Stair training;Therapeutic activities;Therapeutic exercise;Patient/family education    PT Goals (Current goals can be found in the Care Plan section)  Acute Rehab PT Goals Patient Stated Goal: agreed to getting OOB PT Goal Formulation: With patient/family Time For Goal Achievement: 06/12/17 Potential to Achieve Goals: Good    Frequency Min 3X/week   Barriers to discharge        Co-evaluation               AM-PAC PT "6 Clicks" Daily Activity  Outcome Measure Difficulty turning over in bed (including adjusting bedclothes, sheets and blankets)?: Total Difficulty moving from lying on back to sitting on the side of the bed? : Total Difficulty sitting down on and standing up from a chair with arms (e.g., wheelchair, bedside commode, etc,.)?: Total Help needed moving to and from a bed to chair (including a wheelchair)?: Total Help needed walking in hospital room?: Total Help needed climbing 3-5 steps with a railing? : Total 6 Click Score: 6    End of Session   Activity Tolerance: Patient tolerated treatment well Patient left: in chair;with call bell/phone within reach;with family/visitor present;with nursing/sitter in room Nurse Communication: Mobility status PT Visit Diagnosis: Difficulty in walking, not elsewhere classified (R26.2);Muscle weakness (generalized) (M62.81)    Time: 2683-4196 PT Time Calculation (min) (ACUTE ONLY): 32 min   Charges:   PT Evaluation $PT Eval Moderate Complexity: 1 Mod PT Treatments $Therapeutic Activity: 8-22 mins   PT G CodesTresa Endo PT [phone]   [name] 05/29/2017, 11:26 AM

## 2017-05-29 NOTE — Progress Notes (Signed)
[facility] Note  ADMISSION DATE:  05/06/2017  CONSULTATION DATE:  05/13/17  REFERRING MD:  Kieth Brightly  CHIEF COMPLAINT:  Vent Management  Presenting HPI:  56 y.o. 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 On 7/18, he developed a leak therefore was taken back to OR for re-do of ex-lap and repair of leak.  He returned to the ICU on the vent and PCCM was consulted for vent management.  Subjective:  Extubated yesterday, tolerating well.  O2 weaned to 1L.  Remains on dilaudid at 34m/hr.  RN reports precedex started overnight for anxiety.  Per wife, pt did not sleep > he states he "keeps seeing a flash of light when he closes his eyes".    FiO2 (%):  [2 %-3 %] 2 %  Temp:  [98 F (36.7 C)-100.6 F (38.1 C)] 98.1 F (36.7 C) (08/03 0755) Pulse Rate:  [82-114] 82 (08/03 0834) Resp:  [24-37] 28 (08/03 0834) BP: (113-162)/(69-89) 126/72 (08/03 0834) SpO2:  [96 %-100 %] 99 % (08/03 0834) FiO2 (%):  [2 %-3 %] 2 % (08/02 1400)  General: ill appearing adult male in NAD, sitting up in bed HEENT: MM pink/moist, no jvd, NGT sewn in place PSY: calm/appropriate Neuro: Awake, alert, appropriate, generalized weakness but MAE CV: s1s2 rrr, no m/r/g PULM: even/non-labored, lungs bilaterally diminished lower GI: abdominal dressings clean /dry, midline large catheter x2, JP drain with brown drainage in bulb Extremities: warm/dry, trace generalized edema  Skin: no rashes or lesions   CBC Latest Ref Rng & Units 05/29/2017 05/27/2017 05/26/2017  WBC 4.0 - 10.5 K/uL 16.3(H) 14.2(H) 15.8(H)  Hemoglobin 13.0 - 17.0 g/dL 7.2(L) 7.1(L) 7.6(L)  Hematocrit 39.0 - 52.0 % 21.6(L) 21.6(L) 23.6(L)  Platelets 150 - 400 K/uL 852(H) 847(H) 894(H)   BMP Latest Ref Rng & Units 05/29/2017 05/28/2017 05/27/2017  Glucose 65  - 99 mg/dL 161(H) 115(H) 130(H)  BUN 6 - 20 mg/dL 18 21(H) 23(H)  Creatinine 0.61 - 1.24 mg/dL 0.54(L) 0.69 0.69  Sodium 135 - 145 mmol/L 134(L) 135 133(L)  Potassium 3.5 - 5.1 mmol/L 3.8 4.2 4.0  Chloride 101 - 111 mmol/L 98(L) 96(L) 96(L)  CO2 22 - 32 mmol/L 28 32 31  Calcium 8.9 - 10.3 mg/dL 8.3(L) 8.3(L) 8.3(L)   Hepatic Function Latest Ref Rng & Units 05/28/2017 05/26/2017 05/25/2017  Total Protein 6.5 - 8.1 g/dL 6.7 6.6 6.1(L)  Albumin 3.5 - 5.0 g/dL 1.7(L) 1.7(L) 1.6(L)  AST 15 - 41 U/L 38 24 20  ALT 17 - 63 U/L 18 12(L) 12(L)  Alk Phosphatase 38 - 126 U/L 215(H) 97 80  Total Bilirubin 0.3 - 1.2 mg/dL 0.5 0.3 0.3    IMAGING/STUDIES: CT ABD/PELVIS W/ CONTRAST 7/18: 1. Diffuse dilatation of the small greater the large bowel with fluid levels throughout the small and large bowel, most compatible with adynamic ileus in the postoperative state. No bowel wall thickening. No discrete small bowel caliber transition to suggest obstruction. 2. Pneumoperitoneum in the right upper quadrant is presumably due to recent surgery . 3. Small postoperative fluid collection at the right adrenalectomy site without wall thickening or internal gas. 4. Small dependent right pleural effusion. 5. Patchy bibasilar lung consolidation and ground-glass attenuation with some volume loss, concerning for multilobar pneumonia and/or aspiration with some atelectasis.  VENOUS DUPLEX 7/25 >> negative for DVT,  superficial vein thrombosis in the cephalic vein  MICROBIOLOGY: MRSA PCR 7/11:  Negative Abdominal Wound Culture 7/18:  Klebsiella pneumoniae (resistant to Ampicillin) Blood Cultures x2 7/19: negative  Tracheal Aspirate Culture 7/22: multiple organisms present, none predominant BAL 7/27 >> MSSA  BC 7/27 >> MRSE 1/2 > contaminant ?  ANTIBIOTICS:  PCN ALLERGIC Aztreonam 7/17 (x1 dose) Vancomycin 7/17 - 7/21 Cefeime 7/17 >>> 7/31   Eraxis 7/19 >>> 7/21, 7/26 >> 8/1 Flagyl 7/20 >>> 7/30 Vancomycin 7/30 >>  7/31 Ancef 7/31 >>  LINES/TUBES: OETT 07/18 >>> 8/2 RUE PICC 7/20 >>>  RAD ART LINE 7/18 >>> 7/23 Foley 7/28 >>>   SIGNIFICANT EVENTS: 07/11 - Admit for laparoscopic converted to open right adrenalectomy w/ small bowel resection and reanastamosis. Wound vac placed. 07/18 - Found to have leak >> taken back to OR for re-do exploratory laparotomy. Returned to ICU on vent. 07/19 - NGT pulled out and almost self-extubated. Developed cuff leak >> tube exchanged. Back to OR emergently for intestinal leak/ischemic bowel. 07/22 - Back to OR for wash out >> left with open abdomen & wound vac in place. Transfused 1u PRBC. 07/23 - Intermittent agitation, sedation adjusted  07/25 - rising temp, WBC increased. Returned to OR for closure 07/27 - Recultured, sedation down on precedex 07/30 - Remains on sedation, VAC leak over the weekend with dressing change by [facility] 07/31 - Failed SBT > tachypnea/anxiety 08/01 - Weaned 10/5  08/02 - Fever 101.1, WBC 14.2, weaned / extubated  ASSESSMENT/PLAN:  56 y.o. male admitted with adrenal mass positive for metanephrine's s/p right adrenalectomy complicated by small bowel injury in the setting of adhesions with sepsis secondary to intra-abdominal infection. Intubated for almost 2 weeks, required multiple sedatives (chronic ativan & percocet use).  Sepsis - anastomotic leak resulting in peritonitis. MSSA HCAP    Fever - intermittent, ongoing.   P: ABX as above  Completed 14 days cefepime, 7 days Eraxis Continue Ancef, D5/x  Monitor fever curve / WBC trend   Acute hypoxic respiratory failure: in setting of sepsis, post-op adrenalectomy. 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  s/p reanastamosis Protein calorie malnutrition P: Post-op care per CCS NGT to LIS  TPN per pharmacy  CCS ok with oxycodone PT   Anemia - bloody drainage from NGT  Thrombocytosis  P: Trend CBC  Lovenox for DVT prophylaxis  Transfuse for Hgb <7%     History of hypothyroidism  Hyperglycemia P: Repeat TSH ~ 9/1  CBG with SSI    Acute encephalopathy - on ativan, percocet at home Tobacco use P: Reduce ceiling of dilaudid gtt to 39m/hr with PRN bolus Oral oxycodone Q4 > hopeful this will absorb and we can get him off IV pain control  Restart home ativan, 0.5 BID (takes 156mPRN) Wean precedex gtt to off  PT efforts > OOB, mobilize as able    Code Status:  Full Code   Disposition:  ICU  Family Update: Wife updated at bedside am 8/3.     BrNoe GensNP-C [facility] Pgr: [phone] or if no answer [phone]/12/2016, 9:32 AM

## 2017-05-29 NOTE — Progress Notes (Signed)
Small amount of liquid brown stool draining from JP drain, Surgery paged

## 2017-05-29 NOTE — Progress Notes (Signed)
[name] [name] NOTE   Pharmacy Consult for TPN Indication: prolonged ileus, small bowel leak, multiple bowel surgeries  Patient Measurements: Body mass index is 29.51 kg/m. Filed Weights   05/21/17 0555 05/21/17 1111 05/26/17 0430  Weight: 208 lb 8.9 oz (94.6 kg) 208 lb 8.9 oz (94.6 kg) 205 lb 11 oz (93.3 kg)   HPI: 79 yoM admitted on 7/11 for enlarging adrenal mass and planned adrenalectomy.  Pharmacy is now consulted to dose TPN.  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  Planning for return to OR in 48-72h 7/22 OR:  wash out, tube ileostomy, left with open abdomen & wound vac in place.  Plan for OR on 7/25 for closure 7/25 OR: mesh placed for abdominal closure, wound vac 8/2 extubated, propofol drip off  Insulin requirements past 24 hours: 11 units SSI (no hx DM)  Current Nutrition: NPO  IVF: D5 1/2NS at10 ml/hr  Central access: PICC line ordered 7/19, placed on 7/20 TPN start date: 7/20  ASSESSMENT                                                                                                           Today, 05/29/17 - Glucose: 185, goal < 150, on 5/20 formulation - Electrolytes:   Na low  K WNL  Corrected calcium WNL  Mg in normal range  Phos now WNL - Renal:  SCr wnl, stable.  Lasix continuous infusion 7/29-7/31 per PCCM d/t high volume TPN + continuous drips/abx. 8/2 I/O: 4093/3440 net +656mL - Weight: most recent 7/31 93.3kg down from 94.6kg on 7/26 - LFTs: WNL, Tbili WNL - TGs:  296 (7/18), 266 (7/20), 269 (7/23), 210 (7/28), 285 (7/31) 301 (8/3)   -  Prealbumin:  <5 (7/20), 6.1 (7/24), 10.1 (7/30)  NUTRITIONAL GOALS                                                                                             RD recs (7/27):  160 Protein (1.8 g/kg of usual body weight 89.4kg), 2300-2530 Kcal  Clinimix 5/15 at a max fluid volume of 3L at 120 ml/hr  + 20% fat emulsion 240 ml/day to provide: 144 g of protein and 2568 kCals per day meeting 94% of protein and >100% of kCal needs. - Glucose infusion rate will be 3.2 mg/kg/min (Maximum 5 mg/kg/min) - Using  Lipids only 3 times/week on MWF would provide: 150 g of protein and an average of 2336 kCals per day meeting 94% of protein and 100 % of kCal needs.  When using the 5/20 formulation (which is the only available electrolyte-free formulation available), a  rate of 115 ml/hr with lipids 3x/week on MWF would provide: 138g protein and avg 2174 kcal/day   PLAN                                                                                                                          At 1800 today:  Change Clinimix to E 5/15 at 149ml/hr, as phosphorus now WNL and glucose elevated  resume lipids today at 35mls/hr x 12 hours as propofol has been discontinued  TPN to contain standard multivitamins and trace elements daily  Continue IVF at [place] and moderate SSI q6h.  Bmet with mag and phos in am  TPN lab panels on Mondays & Thursdays.  Monitor I/O with high volume TPN   [name] [name] RPh 05/29/2017, 9:01 AM Pager [phone]

## 2017-05-29 NOTE — Progress Notes (Signed)
Nutrition Follow-up  DOCUMENTATION CODES:   Not applicable  INTERVENTION:  - Continue TPN per Pharmacy. - RD will continue to monitor nutrition-related plan.   NUTRITION DIAGNOSIS:   Inadequate oral intake related to inability to eat as evidenced by NPO status. -ongoing  GOAL:   Patient will meet greater than or equal to 90% of their needs -met with TPN regimen.   MONITOR:   Weight trends, Labs, Skin, I & O's, Other (Comment) (TPN regimen)  ASSESSMENT:   56 y.o. 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 On 7/18, he developed a leak therefore was taken back to OR for re-do of ex-lap and repair of leak.  8/3 NGT remains to R nare with ~100cc clear, tan-tinged output at this time. He is receiving Clinimix 5/20 @ 115 mL/hr which is providing 138 grams of protein and 2429 kcal. Spoke with Pharmacist who states plans to add ILE today and to switch back to Clinimix E 5/15 (likely at 120 mL/hr). Will continue to monitor TPN regimen and plan. No new weight since 7/31; will as RN if she is able to weigh pt this shift.  Medications reviewed; 10 mg rectal Dulcolax, sliding scale Novolog, 40 mg IV Protonix/day, 100 mg IV thiamine/day. Labs reviewed; CBG: 185 mg/dL, Na: 134 mmol/L, Cl: 98 mmol/L, creatinine: 0.54 mg/dL, Ca: 8.3 mg/dL.  IVF: D5-1/2 NS @ 10 mL/hr (41 kcal from dextrose).  Drip: Dilaudid @ 5 mg/hr.     8/2 - Pt was extubated shortly after 9AM today; estimated nutrition needs updated based on this event. -  Continue to use weight of 89.4 kg to estimate needs.  - NGT remains in place with 550cc output in canister.  - RN, who is at bedside, reports 50cc of this is from this shift.  - Pt with triple lumen RIJ and is receiving Clinimix E 5/15 @ 120 mL/hr. - Pharmacy note from this AM states plan to  switch to Clinimix 5/20 @ 115 mL/hr and withhold ILE d/t high Propofol requirement; Propofol now off.  - Will monitor for plan concerning ILE. - Clinimix 5/20 @ 115 mL/hr will provide 138 grams of protein and 2429 kcal to meet 100% re-estimated kcal and protein need.  Phos: 5.1 mg/dL IVF: D5-1/2 NS @ 10 mL/hr (41 kcal from dextrose).  Drip: Dilaudid @ 5 mg/hr.    7/30 - Pt continues with NGT and remains intubated.  - Estimated kcal need updated based on medical course.  - Pt had bronch on 7/27 afternoon. - Notes indicate wound vac leak over the weekend which has now been resolved.  - CCM team has begun discussion with wife concerning trach placement.  - Unsuccessful with increased WOB/agitation.  - Acute anasarca which has been improving with addition of Lasix drip. No new weight since 7/26; will request RN re-weigh pt today as time permits. - Spoke with Pharmacist about TPN regimen.  - Pt current receiving Clinimix 5/20 @ 115 mL/hr which is providing 138 grams of protein (86% estimated protein need) and 1960 kcal (83% estimated kcal need).  - Plan to switch to Clinimix E 5/15 (add back lytes given current BMP) @ 120 mL/hr (2880 mL/day) with 20% ILE @ 20 mL/hr x12 hours on MWF. This will provide a daily average of 144 grams of protein (90% estimated protein need) and 2251 kcal (95%  estimated kcal need).  - Pending fluid status, eventual goal for Clinimix E 5/15 @ 125 mL/hr (3000 mL/day) with 20% ILE @ 20 mL/hr x12 hours MWF. T - his regimen will provide a daily average of 150 grams of protein (94% estimated protein need) and 2336 kcal (99% estimated kcal need).  Patient is currently intubated on ventilator support MV: 17.1L/min Temp (24hrs), Avg:100.2 F (37.9 C), Min:99 F (37.2 C), Max:100.8 F (38.2 C) Propofol: 1.52m/hr (29 kcal from fat) BP: 128/74 and MAP: 87 IVF:D5-1/2 NS @ 10 mL/hr (41 kcal from dextrose). Drips: Dilaudid @ 5 mg/hr, Versed @ 1 mg/hr, Propofol @ 2  mcg/kg/min, Lasix @ 5 mg/hr.    *SEE CHART FOR PREVIOUS RD NOTES   Diet Order:  TPN (CLINIMIX) Adult without lytes Diet NPO time specified  Skin:  Wound (see comment) (Incisions to R flank and abdomen from 7/11 and 7/18)  Last BM:  8/2  Height:   Ht Readings from Last 1 Encounters:  05/26/17 _0  (1.778 m)    Weight:   Wt Readings from Last 1 Encounters:  05/26/17 205 lb 11 oz (93.3 kg)    Ideal Body Weight:  75.45 kg  BMI:  Body mass index is 29.51 kg/m.  Estimated Nutritional Needs:   Kcal:  2235-2505 (25-28 kcal/kg)  Protein:  134-152 grams (1.5-1.7 grams/kg)  Fluid:  2.4 L/day  EDUCATION NEEDS:   No education needs identified at this time    JJarome Matin MS, RD, LDN, CNSC Inpatient Clinical Dietitian Pager # [phone] hours/weekend pager # [phone]

## 2017-05-29 NOTE — Progress Notes (Signed)
Rehab Admissions Coordinator Note:  Patient was screened by Cleatrice Burke for appropriateness for an Inpatient Acute Rehab Consult per PT recommendation. OT eval pending.  At this time, we are recommending await therpay progress for today is first day up after extubation 05/28/17. Noted TNA and VAC. We will follow his progress over the next several days to assist in determining dispo needs once medically ready for d/c. [phone].  Cleatrice Burke 05/29/2017, 11:38 AM  I can be reached at [phone].

## 2017-05-29 NOTE — Progress Notes (Signed)
Pharmacy Antibiotic Note  [name] [name] is a 56 y.o. male admitted on 05/06/2017. Patient underwent elective right adrenalectomy 3/33, complicated by small bowel injury requiring resection and anastomosis and an anastomotic leak requiring repair on 7/18, then abdominal wound washout, wound closure, and wound vac placement on 7/25. Course complicated by peritonitis and MSSA pneumonia. CT of abdomen and pelvis today shows fluid collection along the anterior and lateral aspects of the liver, which is a presumed abscess collection.  Antibiotics broadened from Cefazolin to Cefepime and Metronidazole, with pharmacy to assist with dosing.   Plan: Cefepime 2g IV q8h Metronidazole 570m IV q8h Monitor renal function, cultures, clinical course  Height: _0  (177.8 cm) Weight: 205 lb 11 oz (93.3 kg) IBW/kg (Calculated) : 73  Temp (24hrs), Avg:98.4 F (36.9 C), Min:98 F (36.7 C), Max:98.8 F (37.1 C)   Recent Labs Lab 05/24/17 0333 05/25/17 0509 05/26/17 0414 05/27/17 0441 05/28/17 0211 05/29/17 0316  WBC 15.6* 14.5* 15.8* 14.2*  --  16.3*  CREATININE 0.78 0.84 0.78 0.69 0.69 0.54*    Estimated Creatinine Clearance: 118.3 mL/min (A) (by C-G formula based on SCr of 0.54 mg/dL (L)).    Allergies  Allergen Reactions  . Chantix [Varenicline] Palpitations    Heart race Increased panic attacks  . Morphine Itching  . Penicillins Rash    Has patient had a PCN reaction causing immediate rash, facial/tongue/throat swelling, SOB or lightheadedness with hypotension: Yes Has patient had a PCN reaction causing severe rash involving mucus membranes or skin necrosis: Unknown Has patient had a PCN reaction that required hospitalization: No Has patient had a PCN reaction occurring within the last 10 years: No If all of the above answers are "NO", then may proceed with Cephalosporin use.     Antimicrobials this admission: 7/17 Vancomcyin >> 7/21, resume 7/30 >> 7/31 7/17 Cefepime >> 7/31, resume  8/3 >> 7/19 Anidulafungin >> 7/21, resume 7/26 >>  7/20 Metronidazole >> 7/30, resume 8/3 >> 7/31 Cefazolin >> 8/3  Dose adjustments this admission: --  Microbiology results: 7/11 MRSA PCR: negative 7/18 Abdominal culture: moderate Klebsiella pneumoniae- pan-sensitive, except ampicillin 7/19 BCx: NGF 7/22 Trach asp: multple organisms, none predominant 7/27 BAL: rare MSSA 7/27 BCx: 1/2 CoNS  Thank you for allowing pharmacy to be a part of this patient's care.   JLindell Spar PharmD, BCPS Pager: [phone]/12/2016 9:35 PM

## 2017-05-30 ENCOUNTER — Inpatient Hospital Stay (HOSPITAL_COMMUNITY): Payer: Medicare Other

## 2017-05-30 DIAGNOSIS — G8918 Other acute postprocedural pain: Secondary | ICD-10-CM

## 2017-05-30 LAB — BASIC METABOLIC PANEL
Anion gap: 12 (ref 5–15)
BUN: 17 mg/dL (ref 6–20)
CHLORIDE: 95 mmol/L — AB (ref 101–111)
CO2: 25 mmol/L (ref 22–32)
CREATININE: 0.57 mg/dL — AB (ref 0.61–1.24)
Calcium: 8.6 mg/dL — ABNORMAL LOW (ref 8.9–10.3)
GFR calc Af Amer: >60 mL/min (ref >60)
Glucose, Bld: 113 mg/dL — ABNORMAL HIGH (ref 65–99)
Potassium: 3.7 mmol/L (ref 3.5–5.1)
SODIUM: 132 mmol/L — AB (ref 135–145)

## 2017-05-30 LAB — APTT: aPTT: 54 s — ABNORMAL HIGH (ref 24–36)

## 2017-05-30 LAB — CBC
HCT: 23.5 % — ABNORMAL LOW (ref 39.0–52.0)
HEMOGLOBIN: 7.8 g/dL — AB (ref 13.0–17.0)
MCH: 26.7 pg (ref 26.0–34.0)
MCHC: 33.2 g/dL (ref 30.0–36.0)
MCV: 80.5 fL (ref 78.0–100.0)
Platelets: 941 10*3/uL (ref 150–400)
RBC: 2.92 MIL/uL — ABNORMAL LOW (ref 4.22–5.81)
RDW: 15.8 % — ABNORMAL HIGH (ref 11.5–15.5)
WBC: 16.4 10*3/uL — ABNORMAL HIGH (ref 4.0–10.5)

## 2017-05-30 LAB — PROTIME-INR
INR: 1.21
Prothrombin Time: 15.4 s — ABNORMAL HIGH (ref 11.4–15.2)

## 2017-05-30 LAB — GLUCOSE, CAPILLARY
GLUCOSE-CAPILLARY: 111 mg/dL — AB (ref 65–99)
GLUCOSE-CAPILLARY: 114 mg/dL — AB (ref 65–99)
Glucose-Capillary: 120 mg/dL — ABNORMAL HIGH (ref 65–99)
Glucose-Capillary: 120 mg/dL — ABNORMAL HIGH (ref 65–99)

## 2017-05-30 LAB — PHOSPHORUS: PHOSPHORUS: 3.8 mg/dL (ref 2.5–4.6)

## 2017-05-30 LAB — MAGNESIUM: Magnesium: 1.7 mg/dL (ref 1.7–2.4)

## 2017-05-30 LAB — LACTIC ACID, PLASMA: Lactic Acid, Venous: 0.9 mmol/L (ref 0.5–1.9)

## 2017-05-30 MED ORDER — DEXMEDETOMIDINE HCL IN NACL 200 MCG/50ML IV SOLN
0.2000 ug/kg/h | INTRAVENOUS | Status: DC
  Administered 2017-05-30: 0.4 ug/kg/h via INTRAVENOUS
  Administered 2017-05-30: 0.2 ug/kg/h via INTRAVENOUS
  Administered 2017-05-30: 0.4 ug/kg/h via INTRAVENOUS
  Administered 2017-05-30: 0.399 ug/kg/h via INTRAVENOUS
  Filled 2017-05-30 (×4): qty 50

## 2017-05-30 MED ORDER — POTASSIUM CHLORIDE 10 MEQ/50ML IV SOLN
10.0000 meq | INTRAVENOUS | Status: AC
  Administered 2017-05-30 (×3): 10 meq via INTRAVENOUS
  Filled 2017-05-30 (×3): qty 50

## 2017-05-30 MED ORDER — TRACE MINERALS CR-CU-MN-SE-ZN 10-1000-500-60 MCG/ML IV SOLN
INTRAVENOUS | Status: AC
  Administered 2017-05-30: 17:00:00 via INTRAVENOUS
  Filled 2017-05-30: qty 2000

## 2017-05-30 MED ORDER — FAT EMULSION 20 % IV EMUL
240.0000 mL | INTRAVENOUS | Status: AC
  Administered 2017-05-30: 240 mL via INTRAVENOUS
  Filled 2017-05-30: qty 250

## 2017-05-30 MED ORDER — MAGNESIUM SULFATE 2 GM/50ML IV SOLN
2.0000 g | Freq: Once | INTRAVENOUS | Status: DC
  Filled 2017-05-30: qty 50

## 2017-05-30 MED ORDER — CHLORHEXIDINE GLUCONATE CLOTH 2 % EX PADS
6.0000 | MEDICATED_PAD | Freq: Every day | CUTANEOUS | Status: DC
  Administered 2017-05-30 – 2017-07-30 (×56): 6 via TOPICAL

## 2017-05-30 MED ORDER — MAGNESIUM SULFATE 2 GM/50ML IV SOLN
2.0000 g | Freq: Once | INTRAVENOUS | Status: AC
  Administered 2017-05-30: 2 g via INTRAVENOUS
  Filled 2017-05-30: qty 50

## 2017-05-30 MED ORDER — LORAZEPAM 2 MG/ML IJ SOLN
1.0000 mg | INTRAMUSCULAR | Status: DC | PRN
  Administered 2017-05-30 – 2017-05-31 (×2): 1 mg via INTRAVENOUS
  Administered 2017-05-31: 0.5 mg via INTRAVENOUS
  Administered 2017-05-31: 1 mg via INTRAVENOUS
  Administered 2017-05-31: 0.5 mg via INTRAVENOUS
  Administered 2017-06-01: 1 mg via INTRAVENOUS
  Filled 2017-05-30 (×6): qty 1

## 2017-05-30 NOTE — Progress Notes (Signed)
CRITICAL VALUE ALERT  Critical Value:  Platelets 941  Date & Time Notied:  05-30-17 @ [phone]  Provider notified

## 2017-05-30 NOTE — Progress Notes (Signed)
[name] Note  ADMISSION DATE:  05/06/2017  CONSULTATION DATE:  05/13/17  REFERRING MD:  Kieth Brightly  CHIEF COMPLAINT:  Vent Management  Presenting HPI:  56 y.o. 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 On 7/18, he developed a leak therefore was taken back to OR for re-do of ex-lap and repair of leak.  He returned to the ICU on the vent and PCCM was consulted for vent management.   IMAGING/STUDIES: CT ABD/PELVIS W/ CONTRAST 7/18: 1. Diffuse dilatation of the small greater the large bowel with fluid levels throughout the small and large bowel, most compatible with adynamic ileus in the postoperative state. No bowel wall thickening. No discrete small bowel caliber transition to suggest obstruction. 2. Pneumoperitoneum in the right upper quadrant is presumably due to recent surgery . 3. Small postoperative fluid collection at the right adrenalectomy site without wall thickening or internal gas. 4. Small dependent right pleural effusion. 5. Patchy bibasilar lung consolidation and ground-glass attenuation with some volume loss, concerning for multilobar pneumonia and/or aspiration with some atelectasis.  VENOUS DUPLEX 7/25 >> negative for DVT, superficial vein thrombosis in the cephalic vein  MICROBIOLOGY: MRSA PCR 7/11:  Negative Abdominal Wound Culture 7/18:  Klebsiella pneumoniae (resistant to Ampicillin) Blood Cultures x2 7/19: negative  Tracheal Aspirate Culture 7/22: multiple organisms present, [name] predominant BAL 7/27 >> MSSA  BC 7/27 >> MRSE 1/2 > contaminant ?  ANTIBIOTICS:  PCN ALLERGIC Aztreonam 7/17 (x1 dose) Vancomycin 7/17 - 7/21 Cefeime 7/17 >>> 7/31   Eraxis 7/19 >>> 7/21, 7/26 >> 8/1 Flagyl 7/20 >>> 7/30 Vancomycin 7/30 >> 7/31 Ancef 7/31  >>  LINES/TUBES: OETT 07/18 >>> 8/2 RUE PICC 7/20 >>>  RAD ART LINE 7/18 >>> 7/23 Foley 7/28 >>>   SIGNIFICANT EVENTS: 07/11 - Admit for laparoscopic converted to open right adrenalectomy w/ small bowel resection and reanastamosis. Wound vac placed. 07/18 - Found to have leak >> taken back to OR for re-do exploratory laparotomy. Returned to ICU on vent. 07/19 - NGT pulled out and almost self-extubated. Developed cuff leak >> tube exchanged. Back to OR emergently for intestinal leak/ischemic bowel. 07/22 - Back to OR for wash out >> left with open abdomen & wound vac in place. Transfused 1u PRBC. 07/23 - Intermittent agitation, sedation adjusted  07/25 - rising temp, WBC increased. Returned to OR for closure 07/27 - Recultured, sedation down on precedex 07/30 - Remains on sedation, VAC leak over the weekend with dressing change by [facility] 07/31 - Failed SBT > tachypnea/anxiety 08/01 - Weaned 10/5  08/02 - Fever 101.1, WBC 14.2, weaned / extubated 8/3 -   Extubated yesterday, tolerating well.  O2 weaned to 1L.  Remains on dilaudid at 106m/hr.  RN reports precedex started overnight for anxiety.  Per wife, pt did not sleep > he states he "keeps seeing a flash of light when he closes his eyes".     SUBJECTIVE/OVERNIGHT/INTERVAL HX 8/4 - still on dilaudid gtt 537mh (surgical pain) and precedex. COmfortable on this thought last night require lot of prn dilaudid. Pain medsa t hoome: tylenol, advil, percocent, gabapentin, robaxin. NPO  - mild coffee ground on NG but no active bleed     Temp:  [98 F (36.7 C)-99.6 F (37.6 C)] 99.6 F (37.6 C) (08/04 0347) Pulse Rate:  [80-110] 86 (08/04 0900) Resp:  [  20-30] 25 (08/04 0900) BP: (108-152)/(67-108) 115/78 (08/04 0900) SpO2:  [95 %-100 %] 100 % (08/04 0902)     General Appearance:    Looks chornic unwell  Head:    Normocephalic, without obvious abnormality, atraumatic  Eyes:    PERRL - yes, conjunctiva/corneas - clear      Ears:    Normal  external ear canals, both ears  Nose:   NG tube - yes  Throat:  ETT TUBE - no , OG tube - no  Neck:   Supple,  No enlargement/tenderness/nodules     Lungs:     Clear to auscultation bilaterally,   Chest wall:    No deformity  Heart:    S1 and S2 normal, no murmur, CVP - no.  Pressors - no  Abdomen:     Soft, no masses, no organomegaly  Genitalia:    Not done  Rectal:   not done  Extremities:   Extremities- intact     Skin:   Intact in exposed areas . Sacral area - na     Neurologic:   Sedation - sleepding on precerdex gtt and dialudid gtt -> RASS - -2 . Moves all 4s - yes     PULMONARY  Recent Labs Lab 05/24/17 0318 05/25/17 0300  PHART 7.410 7.424  PCO2ART 44.1 46.1  PO2ART 94.1 98.2  HCO3 27.4 29.6*  O2SAT 97.3 97.2    CBC  Recent Labs Lab 05/27/17 0441 05/29/17 0316 05/30/17 0430  HGB 7.1* 7.2* 7.8*  HCT 21.6* 21.6* 23.5*  WBC 14.2* 16.3* 16.4*  PLT 847* 852* 941*    COAGULATION  Recent Labs Lab 05/30/17 0900  INR 1.21    CARDIAC  No results for input(s): TROPONINI in the last 168 hours. No results for input(s): PROBNP in the last 168 hours.   CHEMISTRY  Recent Labs Lab 05/26/17 0414 05/27/17 0441 05/28/17 0211 05/29/17 0316 05/30/17 0430  NA 132* 133* 135 134* 132*  K 3.5 4.0 4.2 3.8 3.7  CL 92* 96* 96* 98* 95*  CO2 31 31 32 28 25  GLUCOSE 129* 130* 115* 161* 113*  BUN 20 23* 21* 18 17  CREATININE 0.78 0.69 0.69 0.54* 0.57*  CALCIUM 8.0* 8.3* 8.3* 8.3* 8.6*  MG 1.6* 2.0 2.0 1.8 1.7  PHOS 3.4 4.3 5.1* 3.1 3.8   Estimated Creatinine Clearance: 118.3 mL/min (A) (by C-G formula based on SCr of 0.57 mg/dL (L)).   LIVER  Recent Labs Lab 05/25/17 0509 05/26/17 0414 05/28/17 0211 05/30/17 0900  AST 20 24 38  --   ALT 12* 12* 18  --   ALKPHOS 80 97 215*  --   BILITOT 0.3 0.3 0.5  --   PROT 6.1* 6.6 6.7  --   ALBUMIN 1.6* 1.7* 1.7*  --   INR  --   --   --  1.21     INFECTIOUS No results for input(s): LATICACIDVEN,  PROCALCITON in the last 168 hours.   ENDOCRINE CBG (last 3)   Recent Labs  05/29/17 1759 05/29/17 2307 05/30/17 0550  GLUCAP 126* 112* 120*         IMAGING x48h  - image(s) personally visualized  -   highlighted in bold Ct Abdomen Pelvis W Contrast  Result Date: 05/29/2017 CLINICAL DATA:  Status post abdominal wound washout, wound closure and wound VAC placement on July 25th. Abdominal pain and fever. Abscess suspected. EXAM: CT ABDOMEN AND PELVIS WITH CONTRAST TECHNIQUE: Multidetector CT imaging of the abdomen and  pelvis was performed using the standard protocol following bolus administration of intravenous contrast. CONTRAST:  35m ISOVUE-300 IOPAMIDOL (ISOVUE-300) INJECTION 61%, 1016mISOVUE-300 IOPAMIDOL (ISOVUE-300) INJECTION 61% COMPARISON:  [name]. FINDINGS: Lower chest: Small right pleural effusion. Mild bibasilar atelectasis. Hepatobiliary: No focal liver abnormality is seen. Gallbladder is mildly distended but otherwise unremarkable. No bile duct dilatation seen. Pancreas: Unremarkable. No pancreatic ductal dilatation or surrounding inflammatory changes. Spleen: Normal in size without focal abnormality. Adrenals/Urinary Tract: Status post right adrenalectomy. Fluid collection in the right adrenalectomy bed has decreased in size, now measuring approximately 4 x 2 cm (previously 5 x 4 cm). Left adrenal gland appears normal. Kidneys are unremarkable without mass or hydronephrosis. Small amount of air in the bladder, likely related to recent catheterization. Bladder otherwise unremarkable. Stomach/Bowel: Status post partial colectomy with surgical bowel anastomoses in the right upper quadrant. Two percutaneous jejunostomy tubes in place, grossly appropriate in position. Additional percutaneous drainage catheter in place with tip projecting across the midline into the anterior left abdomen. No dilated large or small bowel loops.  Enteric tube in the stomach. Vascular/Lymphatic: Aortic  atherosclerosis. No acute appearing vascular abnormality. Scattered small and mildly prominent lymph nodes within the abdomen and pelvis, likely reactive in nature. Reproductive: Prostate is unremarkable. Other: Small amount of free fluid in the abdomen and pelvis. Probable thin abscess collection along the anterior and lateral aspects of the liver, measuring approximately 2 cm greatest thickness, extending for approximately 20 cm along the anterolateral aspects of the liver. No free intraperitoneal air seen. Expected foci of extraperitoneal air seen along the right abdominal wall surgical defect. Musculoskeletal: No acute or suspicious osseous finding. IMPRESSION: 1. Thick-walled fluid collection along the anterior and lateral aspects of the liver. The collection is thin measuring approximately 2 cm greatest thickness (measured on the coronal reconstructions series 5, image 24) but extends for approximately 20 cm along the anterior and lateral margins of the liver (axial series 2, image 22). This is a presumed abscess collection. 2. No evidence of additional abscess collection within the abdomen or pelvis. 3. Small free fluid in the abdomen and pelvis. 4. Nonobstructive bowel gas pattern. Surgical changes of partial bowel resection. No evidence of surgical complicating feature seen. 5. Small right pleural effusion. Small consolidations at each lung base are most likely atelectasis. Pneumonia or aspiration pneumonitis are possible but considered less likely. 6. Support apparatus appears appropriately positioned. 7. Decrease in size of the fluid collection at the right adrenalectomy bed, now measuring 4 x 2 cm (previously 5 x 4 cm). 8. Aortic atherosclerosis. These results will be called to the ordering clinician or representative by the Radiologist Assistant, and communication documented in the PACS or zVision Dashboard. Electronically Signed   By: StFranki Cabot.D.   On: 05/29/2017 17:15   Dg Chest Port 1  View  Result Date: 05/30/2017 CLINICAL DATA:  History of total adrenalectomy. EXAM: PORTABLE CHEST 1 VIEW COMPARISON:  Prior radiograph from 05/29/2017. FINDINGS: Enteric tube courses in the abdomen. Cardiac and mediastinal silhouettes are stable. Aortic atherosclerosis. Right PICC catheter terminates at the cavoatrial junction. Elevation the right hemidiaphragm with associated right basilar opacity, atelectasis or infiltrate. Mild left basilar subsegmental atelectasis. No appreciable pleural effusion. Perihilar vascular congestion without pulmonary edema. No pneumothorax. No acute osseus abnormality.  Cervical ACDF noted. IMPRESSION: 1. Elevation of the right hemidiaphragm with similar right basilar opacity, either atelectasis or infiltrate. Mild left basilar subsegmental atelectasis. 2. Stable position of right PICC catheter. Electronically Signed   By: BeMarland Kitchen  Jeannine Boga M.D.   On: 05/30/2017 06:34   Dg Chest Port 1 View  Result Date: 05/29/2017 CLINICAL DATA:  Respiratory failure. EXAM: PORTABLE CHEST 1 VIEW COMPARISON:  05/28/2017. FINDINGS: Interim extubation. NG tube and right PICC line in stable position. Heart size normal. Stable right base atelectasis and infiltrate. Small left pleural effusion. Cervical spine fusion. IMPRESSION: 1. Interim extubation. NG tube and right PICC line in stable position. 2. Stable right base atelectasis/infiltrate. Mild left base subsegmental atelectasis. Electronically Signed   By: Marcello Moores  Register   On: 05/29/2017 06:40       ASSESSMENT/PLAN:  56 y.o. male admitted with adrenal mass positive for metanephrine's s/p right adrenalectomy complicated by small bowel injury in the setting of adhesions with sepsis secondary to intra-abdominal infection. Intubated for almost 2 weeks, required multiple sedatives (chronic ativan & percocet use).  Sepsis - anastomotic leak resulting in peritonitis. MSSA HCAP    Fever - intermittent, ongoing.      - 05/30/2017 - not on  pressors P: ABX as above  Completed 14 days cefepime, 7 days Eraxis Continue Ancef, D6/x  Monitor fever curve / WBC trend   Acute hypoxic respiratory failure: in setting of sepsis, post-op adrenalectomy. 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 Abd soft. Only on somme baseline pain meds at home  P: dilaudid gtt to 64m/hr with PRN bolus Unable to take po - so no oral oxycodone Q4 > h Continue home ativan Check lactate precedex gtt to take edge off  Might need opiopid rotation PT efforts > OOB, mobilize as able    Code Status:  Full Code   Disposition:  ICU  Family Update: Wife updated at bedside am 8/3.      Dr. MBrand Males M.D., FNav[facility] Clinic [facility], NewportC.P Pulmonary and Critical Care Medicine Staff Physician C[facility]iaPulmonary and Critical Care Pager: [phone] If no answer or between  15:00h - 7:00h: call [phone]  05/30/2017 9:55 AM

## 2017-05-30 NOTE — Progress Notes (Signed)
[facility] NOTE   Pharmacy Consult for TPN Indication: prolonged ileus, small bowel leak, multiple bowel surgeries  Patient Measurements: Body mass index is 29.51 kg/m. Filed Weights   05/21/17 0555 05/21/17 1111 05/26/17 0430  Weight: 208 lb 8.9 oz (94.6 kg) 208 lb 8.9 oz (94.6 kg) 205 lb 11 oz (93.3 kg)   HPI: 26 yoM admitted on 7/11 for enlarging adrenal mass and planned adrenalectomy.  Pharmacy is now consulted to dose TPN.  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  Planning for return to OR in 48-72h 7/22 OR:  wash out, tube ileostomy, left with open abdomen & wound vac in place.  Plan for OR on 7/25 for closure 7/25 OR: mesh placed for abdominal closure, wound vac 8/2 extubated, propofol drip off  Insulin requirements past 24 hours: 4 units SSI (no hx DM)  Current Nutrition: NPO  IVF: D5 1/2NS at10 ml/hr  Central access: PICC line ordered 7/19, placed on 7/20 TPN start date: 7/20  ASSESSMENT                                                                                                           Today, 05/30/17 - Glucose: 185, goal < 150, on 5/20 formulation - Electrolytes:   Na low  K low end of normal (goal 4.0 for wound healing)  Corrected calcium WNL  Mg low end of normal (goal 2.0 for wound healing)  Phos  WNL - Renal:  SCr wnl, stable. . 8/2 I/O: 3937/4095 net -170mL - Weight: most recent 7/31 93.3kg down from 94.6kg on 7/26 - LFTs: WNL, Tbili WNL - TGs:  296 (7/18), 266 (7/20), 269 (7/23), 210 (7/28), 285 (7/31) 301 (8/3)   - Prealbumin:  <5  (7/20), 6.1 (7/24), 10.1 (7/30)  NUTRITIONAL GOALS                                                                                             RD recs (7/27):  160 Protein (1.8 g/kg of usual body weight 89.4kg), 2300-2530 Kcal  Clinimix 5/15 at a max fluid volume of 3L at 120 ml/hr  + 20% fat emulsion 240 ml/day to provide: 144 g of protein and 2568 kCals per day meeting 94% of protein and >100% of kCal needs. - Glucose infusion rate will be 3.2 mg/kg/min (Maximum 5 mg/kg/min) -  Using Lipids only 3 times/week on MWF would provide: 150 g of protein and an average of 2336 kCals per day meeting 94% of protein and 100 % of kCal needs.  When using the 5/20 formulation (which is the only available electrolyte-free formulation available), a  rate of 115 ml/hr with lipids 3x/week on MWF would provide: 138g protein and avg 2174 kcal/day   PLAN                                                                                                                         Magnesium sulfate 2gm IV x1, KCl 73meq IV x 3 At 1800 today:  Continue Clinimix to E 5/15 at 196ml/hr  continue lipids today at 26mls/hr x 12 hours (propofol has been d/c'd and pt has been in ICU on TPN >7days)  TPN to contain standard multivitamins and trace elements daily  Continue IVF at [place] and moderate SSI q6h.  Bmet with mag and phos in am  TPN lab panels on Mondays & Thursdays.  Monitor I/O with high volume TPN   Dolly Rias RPh 05/30/2017, 8:29 AM Pager [phone]

## 2017-05-30 NOTE — Progress Notes (Signed)
Progress Note: General Surgery Service   Assessment/Plan: Patient Active Problem List   Diagnosis Date Noted  . Pressure injury of skin 05/29/2017  . Mucus plugging of bronchi   . Acute respiratory failure with hypoxemia ([place])   . Ileus ([place])   . Hypokalemia 05/14/2017  . Anastomotic leak of intestine 05/14/2017  . Severe sepsis ([place]) 05/14/2017  . Wound dehiscence, surgical 05/14/2017  . Aspiration pneumonia ([place]) 05/14/2017  . Chronic narcotic use 05/10/2017  . Anxiety state 05/10/2017  . Intra-abdominal adhesions s/p SB resection 05/06/2017 05/09/2017  . H/O total adrenalectomy ([place]) 05/06/2017  . Right adrenal mass s/p adrenalectomy 05/06/2017 05/06/2017  . Throat symptom 03/18/2016  . Multinodular goiter 01/19/2016  . Hyperthyroidism 01/19/2016  . Essential hypertension   . Diarrhea 11/30/2014  . Anemia, iron deficiency 11/01/2014  . Leukocytosis 11/03/2013  . Tobacco abuse 07/26/2013  . COPD (chronic obstructive pulmonary disease) ([place]) 07/26/2013  . Left knee pain 07/26/2013  . Pain in joint, shoulder region 05/03/2013  . GERD 07/24/2008  . 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 Appears contrast made the journey from NG to ileal tube yesterday. Discussed with IR, will attempt aspiration today: abx changed to zosyn for intraabdominal coverage -vac change today -continue drains -irrigate blake drain q12h -continue PT  Thrombocytosis increasing -continue to monitor  ABLA- stable Continue to monitor  Chronic pain -continue dilaudid -continue to wean precedex as tolerated -continue per tube oxycodone -continue intermittent ativan   LOS: 24 days  Chief Complaint/Subjective: Bloating pain in abdomen after contrast administration.   Objective: Vital signs in last 24 hours: Temp:  [98 F  (36.7 C)-99.6 F (37.6 C)] 99.6 F (37.6 C) (08/04 0347) Pulse Rate:  [80-110] 87 (08/04 0800) Resp:  [20-30] 22 (08/04 0800) BP: (102-152)/(67-108) 120/77 (08/04 0800) SpO2:  [94 %-100 %] 100 % (08/04 0902) Last BM Date:  (ileal tubes)  Intake/Output from previous day: 08/03 0701 - 08/04 0700 In: 3937.3 [I.V.:3857.3; NG/GT:30] Out: 4098 [Urine:1895; Emesis/NG output:1550; Drains:650] Intake/Output this shift: Total I/O In: 199.3 [I.V.:149.3; IV Piggyback:50] Out: -   Lungs: CTAB  Cardiovascular: RRR  Abd: soft, drains in place, thick brown liquid from blake drain, vac functional and in place  Extremities: no edema  Neuro: AOx4  Lab Results: CBC   Recent Labs  05/29/17 0316 05/30/17 0430  WBC 16.3* 16.4*  HGB 7.2* 7.8*  HCT 21.6* 23.5*  PLT 852* 941*   BMET  Recent Labs  05/29/17 0316 05/30/17 0430  NA 134* 132*  K 3.8 3.7  CL 98* 95*  CO2 28 25  GLUCOSE 161* 113*  BUN 18 17  CREATININE 0.54* 0.57*  CALCIUM 8.3* 8.6*   PT/INR No results for input(s): LABPROT, INR in the last 72 hours. ABG No results for input(s): PHART, HCO3 in the last 72 hours.  Invalid input(s): PCO2, PO2  Studies/Results:  Anti-infectives: Anti-infectives    Start     Dose/Rate Route Frequency Ordered Stop   05/29/17 2200  ceFEPIme (MAXIPIME) 2 g in dextrose 5 % 50 mL IVPB     2 g 100 mL/hr over 30 Minutes Intravenous Every 8 hours 05/29/17 2031     05/29/17 2100  metroNIDAZOLE (FLAGYL) IVPB 500 mg     500 mg 100 mL/hr over 60 Minutes Intravenous Every 8 hours 05/29/17 2029     05/26/17 2200  ceFAZolin (ANCEF) IVPB 1 g/50 mL premix  Status:  Discontinued     1 g 100 mL/hr over 30 Minutes Intravenous Every 8 hours 05/26/17 1008 05/29/17 2023   05/25/17 1100  vancomycin (VANCOCIN) 1,250 mg in sodium chloride 0.9 % 250 mL IVPB  Status:  Discontinued     1,250 mg 166.7 mL/hr over 90 Minutes Intravenous Every 12 hours 05/25/17 0955 05/26/17 0946   05/22/17 1400   anidulafungin (ERAXIS) 100 mg in sodium chloride 0.9 % 100 mL IVPB     100 mg 78 mL/hr over 100 Minutes Intravenous Every 24 hours 05/21/17 1330 05/27/17 1740   05/21/17 1400  anidulafungin (ERAXIS) 200 mg in sodium chloride 0.9 % 200 mL IVPB     200 mg 78 mL/hr over 200 Minutes Intravenous  Once 05/21/17 1330 05/21/17 1940   05/15/17 1000  anidulafungin (ERAXIS) 100 mg in sodium chloride 0.9 % 100 mL IVPB  Status:  Discontinued     100 mg 78 mL/hr over 100 Minutes Intravenous Every 24 hours 05/14/17 0829 05/16/17 0901   05/15/17 1000  metroNIDAZOLE (FLAGYL) IVPB 500 mg  Status:  Discontinued     500 mg 100 mL/hr over 60 Minutes Intravenous Every 8 hours 05/15/17 0903 05/25/17 0938   05/14/17 0900  anidulafungin (ERAXIS) 200 mg in sodium chloride 0.9 % 200 mL IVPB     200 mg 78 mL/hr over 200 Minutes Intravenous  Once 05/14/17 0829 05/14/17 1325   05/13/17 1927  vancomycin (VANCOCIN) 1-5 GM/200ML-% IVPB    Comments:  Ward, Christa   : cabinet override      05/13/17 1927 05/14/17 0744   05/12/17 1400  ceFEPIme (MAXIPIME) 1 g in dextrose 5 % 50 mL IVPB     1 g 100 mL/hr over 30 Minutes Intravenous Every 8 hours 05/12/17 0939 05/26/17 2359   05/12/17 0900  vancomycin (VANCOCIN) IVPB 1000 mg/200 mL premix  Status:  Discontinued     1,000 mg 200 mL/hr over 60 Minutes Intravenous Every 12 hours 05/12/17 0750 05/16/17 0852   05/12/17 0830  aztreonam (AZACTAM) 2 GM IVPB     2 g 100 mL/hr over 30 Minutes Intravenous  Once 05/12/17 0800 05/12/17 0957   05/06/17 0916  vancomycin (VANCOCIN) IVPB 1000 mg/200 mL premix     1,000 mg 200 mL/hr over 60 Minutes Intravenous On call to O.R. 05/06/17 0916 05/06/17 1229      Medications: Scheduled Meds: . Chlorhexidine Gluconate Cloth  6 each Topical Q1200  . enoxaparin (LOVENOX) injection  40 mg Subcutaneous Q24H  . insulin aspart  0-15 Units Subcutaneous Q6H  . ipratropium-albuterol  3 mL Nebulization Q6H  . lip balm  1 application Topical BID  .  LORazepam  0.5 mg Intravenous BID  . nicotine  21 mg Transdermal Daily  . oxyCODONE  10 mg Per Tube Q4H  . pantoprazole (PROTONIX) IV  40 mg Intravenous Q24H  . thiamine injection  100 mg Intravenous Daily   Continuous Infusions: . ceFEPime (MAXIPIME) IV Stopped (05/30/17 8119)  . dexmedetomidine (PRECEDEX) IV infusion 0.399 mcg/kg/hr (05/30/17 0800)  . dextrose 5 % and 0.45% NaCl 10 mL/hr at 05/30/17 0800  . Marland KitchenTPN (CLINIMIX-E) Adult     And  . fat emulsion    . HYDROmorphone 5 mg/hr (05/30/17 0800)  . metronidazole Stopped (05/30/17 0535)  . potassium chloride    . Marland KitchenTPN (CLINIMIX-E) Adult 120 mL/hr at 05/30/17 0800   PRN Meds:.acetaminophen, HYDROmorphone, iopamidol, labetalol, magic mouthwash, menthol-cetylpyridinium, ondansetron **OR**  ondansetron (ZOFRAN) IV, phenol, sodium chloride flush  Mickeal Skinner, MD Pg# [phone] [facility]

## 2017-05-30 NOTE — Progress Notes (Signed)
[facility] Progress Note Patient Name: [name] [name] DOB: [birth year] MRN: [id number]   Date of Service  05/30/2017  HPI/Events of Note  Persistent issues of inadequate pain control per bedside nurse.  Patient on dilaudid gtt at 4 with PRNs given regularly.  Was on dilaudid up to 6 and precedex last pm along with prn atian.  Also Roxicodone.  Patient is HD stable and protecting airway  eICU Interventions  Plan: Increase dilaudid gtt up to 5 No other changes to pain management regimen     Intervention Category Major Interventions: Other:  DETERDING,ELIZABETH 05/30/2017, 12:50 AM

## 2017-05-30 NOTE — H&P (Signed)
Chief Complaint: RUQ fluid collection  Referring Physician(s): [name], Arta Bruce  Supervising Physician: Jacqulynn Cadet  Patient Status: [facility] - In-pt  History of Present Illness: [name] [name] is a 56 y.o. male 56 year old man underwent elective right adrenalectomy 8/50 which was complicated by small bowel injury requiring resection and anastomosis and an anastomotic leak requiring repair on 7/18.   He went back to the OR on 7/22 for washout and was was left with an open abdomen with a wound vac in place.  He had required intubation with mechanical ventilation x 2 weeks and was extubated on 05/28/2017.  His course complicated by peritonitis, MSSA pneumonia and severe agitation and pain requiring sedative drips.  He developed increasing abdominal pain and fever yesterday and CT scan was obtained.  CT revealed thick-walled fluid collection along the anterior and lateral aspects of the liver. The collection is thin measuring approximately 2 cm greatest thickness. This is a presumed abscess collection.  Past Medical History:  Diagnosis Date  . Anemia   . Anxiety   . Anxiety state 05/10/2017  . Arthritis    neck, shoulder, left knee  . 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 Erythrocytosis 11/30/2014  . Essential hypertension    under control with med., has been on med. x 1 yr.  Marland Kitchen GERD (gastroesophageal reflux disease)   . Headache(784.0)    2 x/week  . History of kidney stones   . Hypothyroidism    hx of   . Leukocytosis, unspecified 11/03/2013  . Limited joint range of motion    cervical fusion 02/2013  . Medial meniscus tear 06/2013   left knee  . Midsternal chest pain 03/05/2015  . Thyroid nodule   . Tobacco abuse   . Wears partial dentures    upper    Past Surgical History:  Procedure Laterality Date  . ANTERIOR CERVICAL DECOMP/DISCECTOMY FUSION N/A 03/18/2013   Procedure: ANTERIOR CERVICAL DECOMPRESSION/DISCECTOMY FUSION 3 LEVELS;  Surgeon: Erline Levine, MD;   Location: [place] [facility];  Service: Neurosurgery;  Laterality: N/A;  Anterior Cervical Three-Six  Decompression/Diskectomy/Fusion  . APPENDECTOMY    . CIRCUMCISION  05/04/2006  . COLONOSCOPY    . EYE SURGERY     bilateral cataract surgery   . HEMICOLECTOMY Right 2/77/4128   with ileocolic anastomosis  . INGUINAL HERNIA REPAIR    . KNEE ARTHROSCOPY Left 08/01/2013   Procedure: LEFT ARTHROSCOPY KNEE;  Surgeon: Kerin Salen, MD;  Location: [place];  Service: Orthopedics;  Laterality: Left;  . LAPAROSCOPIC ADRENALECTOMY Right 05/06/2017   Procedure: OPEN RIGHT ADRENALECTOMY WITH SMALL BOWEL RESECTION AND ANASTOMOSIS;  Surgeon: Kieth Brightly, Arta Bruce, MD;  Location: [facility];  Service: General;  Laterality: Right;  . LAPAROTOMY N/A 05/13/2017   Procedure: EXPLORATORY LAPAROTOMY, with wound dehisense, and repair of anastamotic leak;  Surgeon: [name], Arta Bruce, MD;  Location: [facility];  Service: General;  Laterality: N/A;  . LAPAROTOMY N/A 05/14/2017   Procedure: EXPLORATORY LAPAROTOMY, LYSIS OF ADHESIONS/ SMALL BOWEL RESECTION/ WASHOUT OF ABDOMEN AND PLACEMENT OF NEGATIVE PRESSURE DRESSING;  Surgeon: [name], Arta Bruce, MD;  Location: [facility];  Service: General;  Laterality: N/A;  . LAPAROTOMY N/A 05/17/2017   Procedure: EXPLORATORY LAPAROTOMY, PLACEMENT OF ILEOSTOMY TUBES, PARTIAL CLOSURE OF FASCIA, WOUND VAC EXCHANGE;  Surgeon: [name], Arta Bruce, MD;  Location: [facility];  Service: General;  Laterality: N/A;  . LAPAROTOMY N/A 05/20/2017   Procedure: ABDOMINAL WOUND [place] OUT, WOUND CLOSURE AND WOUND [place];  Surgeon: Kieth Brightly, Arta Bruce, MD;  Location: [facility];  Service: General;  Laterality: N/A;  . SHOULDER ARTHROSCOPY W/ ROTATOR CUFF REPAIR Left 07/16/2010  . SMALL INTESTINE SURGERY  04/24/2000   adhesiolysis, ileocolic anastomosis  . UPPER GASTROINTESTINAL ENDOSCOPY      Allergies: Chantix [varenicline]; Morphine; and Penicillins  Medications: Prior to Admission  medications   Medication Sig Start Date End Date Taking? Authorizing Provider  acetaminophen (TYLENOL) 500 MG tablet Take 1,500 mg by mouth daily as needed for mild pain.    Yes [provider]  atorvastatin (LIPITOR) 10 MG tablet Take 10 mg by mouth daily.   Yes [provider]  chlorthalidone (HYGROTON) 25 MG tablet Take 12.5 mg by mouth daily.  02/11/17  Yes [provider]  cloNIDine (CATAPRES) 0.1 MG tablet Take 0.1 mg by mouth 2 (two) times daily. 04/08/17  Yes [provider]  gabapentin (NEURONTIN) 100 MG capsule Take 100 mg by mouth 2 (two) times daily as needed (nerve pain).  11/14/14  Yes [provider]  ibuprofen (ADVIL,MOTRIN) 200 MG tablet Take 600 mg by mouth daily as needed for headache. Reported on 03/18/2016   Yes [provider]  LORazepam (ATIVAN) 1 MG tablet Take 1 mg by mouth 2 (two) times daily as needed for anxiety.   Yes [provider]  methocarbamol (ROBAXIN) 500 MG tablet Take 1 tablet (500 mg total) by mouth 2 (two) times daily. Patient taking differently: Take 500 mg by mouth daily as needed for muscle spasms.  10/05/15  Yes Larene Pickett, PA-C  Multiple Vitamin (MULTIVITAMIN WITH MINERALS) TABS Take 1 tablet by mouth daily.   Yes [provider]  omeprazole (PRILOSEC) 40 MG capsule Take 1 capsule (40 mg total) by mouth 2 (two) times daily. Patient taking differently: Take 40 mg by mouth daily.  07/22/13  Yes Theodis Blaze, MD  oxyCODONE-acetaminophen (PERCOCET) 10-325 MG per tablet Take 1-2 tablets by mouth every 6 (six) hours as needed for pain. 08/25/13  Yes Katheren Shams, MD  Potassium Chloride ER 20 MEQ TBCR Take 20 mEq by mouth daily. 02/18/17  Yes [provider]  sildenafil (VIAGRA) 100 MG tablet Take 100 mg by mouth daily as needed for erectile dysfunction.   Yes [provider]  tadalafil (CIALIS) 20 MG tablet Take 20 mg by mouth daily as needed for erectile dysfunction.    Yes [provider]  valsartan (DIOVAN) 80 MG tablet Take 80 mg by mouth daily. 03/03/17  Yes [provider]  labetalol (NORMODYNE) 100 MG tablet Take 0.5 tablets (50 mg total) by mouth 2 (two) times daily. Patient not taking: Reported on 04/28/2017 03/28/17   Renato Shin, MD     Family History  Problem Relation Age of Onset  . Cancer Father 39       colon ca - deceased.  . Colon cancer Father   . Cancer Sister 14       breast ca  . Thyroid disease Neg Hx     Social History   Social History  . Marital status: Married    Spouse name: N/A  . Number of children: N/A  . Years of education: N/A   Social History Main Topics  . Smoking status: Current Every Day Smoker    Packs/day: 1.00    Years: 25.00    Types: Cigarettes  . Smokeless tobacco: Never Used  . Alcohol use Yes     Comment: occasionally  . Drug use: No  .  Sexual activity: Not Asked   Other Topics Concern  . None   Social History Narrative   Lives in [place] with his wife.    Review of Systems  Unable to perform ROS: Other  Recent pain medication administration  Vital Signs: BP 115/78   Pulse 86   Temp 99.6 F (37.6 C) (Oral)   Resp (!) 25   Ht 5\' 10"  (1.778 m)   Wt 205 lb 11 oz (93.3 kg)   SpO2 100%   BMI 29.51 kg/m   Physical Exam  Constitutional: He appears well-developed.  HENT:  Head: Normocephalic and atraumatic.  Eyes: EOM are normal.  Neck: Normal range of motion.  Cardiovascular: Normal rate, regular rhythm and normal heart sounds.   Pulmonary/Chest: Effort normal and breath sounds normal.  Abdominal:    Red = Large defect RUQ with wound vac in place Blue = drains  Neurological:  Patient sleepy secondary to recent administration of pain medication.  Skin: Skin is warm and dry.  Vitals reviewed.   Imaging: Dg Chest 1 View  Result Date: 05/20/2017 CLINICAL DATA:  Postoperative desaturation. EXAM: CHEST 1 VIEW COMPARISON:  Portable chest x-ray of May 20, 2017  FINDINGS: The lungs are adequately inflated. There is persistent increased density in the retrocardiac region on the left with obscuration of the hemidiaphragm and blunting of the lateral costophrenic angle. On the right there is patchy increased density just above the hemidiaphragm which is stable. There is no pneumothorax. The heart and pulmonary vascularity are normal. There is calcification wall of the aortic arch. The endotracheal tube lies approximately 12 mm above the carina. The esophagogastric tube's proximal port lies at or just below the GE junction with the tip projecting below the inferior margin of the image. The PICC line tip projects over the midportion of the SVC. IMPRESSION: Persistent low positioning of the endotracheal tube. Withdrawal by 2 cm is recommended in an effort to avoid accidental mainstem bronchus intubation with patient movement. Persistent bibasilar atelectasis or pneumonia. Small left pleural effusion. Thoracic aortic atherosclerosis. Electronically Signed   By: David  Martinique M.D.   On: 05/20/2017 13:31   Ct Abdomen Pelvis W Contrast  Result Date: 05/29/2017 CLINICAL DATA:  Status post abdominal wound washout, wound closure and wound VAC placement on July 25th. Abdominal pain and fever. Abscess suspected. EXAM: CT ABDOMEN AND PELVIS WITH CONTRAST TECHNIQUE: Multidetector CT imaging of the abdomen and pelvis was performed using the standard protocol following bolus administration of intravenous contrast. CONTRAST:  2mL ISOVUE-300 IOPAMIDOL (ISOVUE-300) INJECTION 61%, 165mL ISOVUE-300 IOPAMIDOL (ISOVUE-300) INJECTION 61% COMPARISON:  None. FINDINGS: Lower chest: Small right pleural effusion. Mild bibasilar atelectasis. Hepatobiliary: No focal liver abnormality is seen. Gallbladder is mildly distended but otherwise unremarkable. No bile duct dilatation seen. Pancreas: Unremarkable. No pancreatic ductal dilatation or surrounding inflammatory changes. Spleen: Normal in size without  focal abnormality. Adrenals/Urinary Tract: Status post right adrenalectomy. Fluid collection in the right adrenalectomy bed has decreased in size, now measuring approximately 4 x 2 cm (previously 5 x 4 cm). Left adrenal gland appears normal. Kidneys are unremarkable without mass or hydronephrosis. Small amount of air in the bladder, likely related to recent catheterization. Bladder otherwise unremarkable. Stomach/Bowel: Status post partial colectomy with surgical bowel anastomoses in the right upper quadrant. Two percutaneous jejunostomy tubes in place, grossly appropriate in position. Additional percutaneous drainage catheter in place with tip projecting across the midline into the anterior left abdomen. No dilated large or small bowel loops.  Enteric tube in  the stomach. Vascular/Lymphatic: Aortic atherosclerosis. No acute appearing vascular abnormality. Scattered small and mildly prominent lymph nodes within the abdomen and pelvis, likely reactive in nature. Reproductive: Prostate is unremarkable. Other: Small amount of free fluid in the abdomen and pelvis. Probable thin abscess collection along the anterior and lateral aspects of the liver, measuring approximately 2 cm greatest thickness, extending for approximately 20 cm along the anterolateral aspects of the liver. No free intraperitoneal air seen. Expected foci of extraperitoneal air seen along the right abdominal wall surgical defect. Musculoskeletal: No acute or suspicious osseous finding. IMPRESSION: 1. Thick-walled fluid collection along the anterior and lateral aspects of the liver. The collection is thin measuring approximately 2 cm greatest thickness (measured on the coronal reconstructions series 5, image 24) but extends for approximately 20 cm along the anterior and lateral margins of the liver (axial series 2, image 22). This is a presumed abscess collection. 2. No evidence of additional abscess collection within the abdomen or pelvis. 3. Small free  fluid in the abdomen and pelvis. 4. Nonobstructive bowel gas pattern. Surgical changes of partial bowel resection. No evidence of surgical complicating feature seen. 5. Small right pleural effusion. Small consolidations at each lung base are most likely atelectasis. Pneumonia or aspiration pneumonitis are possible but considered less likely. 6. Support apparatus appears appropriately positioned. 7. Decrease in size of the fluid collection at the right adrenalectomy bed, now measuring 4 x 2 cm (previously 5 x 4 cm). 8. Aortic atherosclerosis. These results will be called to the ordering clinician or representative by the Radiologist Assistant, and communication documented in the PACS or zVision Dashboard. Electronically Signed   By: Franki Cabot M.D.   On: 05/29/2017 17:15   Ct Abdomen Pelvis W Contrast  Result Date: 05/13/2017 CLINICAL DATA:  Inpatient. Ileus. Severe abdominal pain. Status post laparoscopic converted to open right adrenalectomy on 44/31/5400 complicated by small bowel injury requiring small bowel resection with dense adhesions throughout the abdomen. Prior appendectomy and right hemicolectomy. EXAM: CT ABDOMEN AND PELVIS WITH CONTRAST TECHNIQUE: Multidetector CT imaging of the abdomen and pelvis was performed using the standard protocol following bolus administration of intravenous contrast. CONTRAST:  194mL ISOVUE-300 IOPAMIDOL (ISOVUE-300) INJECTION 61% COMPARISON:  05/11/2017 abdominal radiographs. 09/15/2015 CT abdomen/pelvis. 12/16/2016 chest CT. FINDINGS: Lower chest: Small dependent right pleural effusion. Patchy consolidation and ground-glass attenuation throughout both lung bases with some volume loss. Hepatobiliary: Normal liver size. No discrete liver mass. Mild heterogeneity in the anterior inferior right liver lobe near the gallbladder fossa may be artifactual versus mild postsurgical change. Normal gallbladder with no radiopaque cholelithiasis. No biliary ductal dilatation.  Pancreas: Normal, with no mass or duct dilation. Spleen: Normal size. No mass. Adrenals/Urinary Tract: Status post right adrenalectomy. Ill-defined 5.1 x 3.8 cm simple appearing fluid collection in the right adrenalectomy bed (series 2/ image 36) without internal gas or associated wall thickening. No left adrenal nodules. No hydronephrosis. No renal masses. Stomach/Bowel: Distended fluid-filled stomach with no appreciable gastric wall thickening or pneumatosis. There is diffuse dilatation of the small bowel up to 5.6 cm diameter, with fluid levels throughout the small bowel. Surgical sutures are noted in the right abdomen from enteroenterostomy sites. No discrete small bowel caliber transition site. No small bowel wall thickening. Status post right hemicolectomy with intact appearing ileocolic anastomosis. Mild diffuse dilatation of the large bowel with fluid levels throughout the large bowel. No large bowel wall thickening. Vascular/Lymphatic: Atherosclerotic nonaneurysmal abdominal aorta. Patent portal, splenic, hepatic and renal veins. No pathologically enlarged lymph  nodes in the abdomen or pelvis. Reproductive: Top-normal size prostate with nonspecific internal prostatic calcifications. Other: Pneumoperitoneum under the right hemidiaphragm and in the ventral right abdominal wall. No additional focal fluid collections. Open wound in the ventral right upper abdominal wall. Mild dependent superficial edema in the low back and lateral gluteal regions. Musculoskeletal: No aggressive appearing focal osseous lesions. IMPRESSION: 1. Diffuse dilatation of the small greater the large bowel with fluid levels throughout the small and large bowel, most compatible with adynamic ileus in the postoperative state. No bowel wall thickening. No discrete small bowel caliber transition to suggest obstruction. 2. Pneumoperitoneum in the right upper quadrant is presumably due to recent surgery . 3. Small postoperative fluid  collection at the right adrenalectomy site without wall thickening or internal gas. 4. Small dependent right pleural effusion. 5. Patchy bibasilar lung consolidation and ground-glass attenuation with some volume loss, concerning for multilobar pneumonia and/or aspiration with some atelectasis. Electronically Signed   By: Ilona Sorrel M.D.   On: 05/13/2017 18:01   Dg Chest Port 1 View  Result Date: 05/30/2017 CLINICAL DATA:  History of total adrenalectomy. EXAM: PORTABLE CHEST 1 VIEW COMPARISON:  Prior radiograph from 05/29/2017. FINDINGS: Enteric tube courses in the abdomen. Cardiac and mediastinal silhouettes are stable. Aortic atherosclerosis. Right PICC catheter terminates at the cavoatrial junction. Elevation the right hemidiaphragm with associated right basilar opacity, atelectasis or infiltrate. Mild left basilar subsegmental atelectasis. No appreciable pleural effusion. Perihilar vascular congestion without pulmonary edema. No pneumothorax. No acute osseus abnormality.  Cervical ACDF noted. IMPRESSION: 1. Elevation of the right hemidiaphragm with similar right basilar opacity, either atelectasis or infiltrate. Mild left basilar subsegmental atelectasis. 2. Stable position of right PICC catheter. Electronically Signed   By: Jeannine Boga M.D.   On: 05/30/2017 06:34   Dg Chest Port 1 View  Result Date: 05/29/2017 CLINICAL DATA:  Respiratory failure. EXAM: PORTABLE CHEST 1 VIEW COMPARISON:  05/28/2017. FINDINGS: Interim extubation. NG tube and right PICC line in stable position. Heart size normal. Stable right base atelectasis and infiltrate. Small left pleural effusion. Cervical spine fusion. IMPRESSION: 1. Interim extubation. NG tube and right PICC line in stable position. 2. Stable right base atelectasis/infiltrate. Mild left base subsegmental atelectasis. Electronically Signed   By: Marcello Moores  Register   On: 05/29/2017 06:40   Dg Chest Port 1 View  Result Date: 05/28/2017 CLINICAL DATA:   Intubated patient, respiratory failure patient's proximally 10 days post laparotomy and subsequent abdominal wound clean out. EXAM: PORTABLE CHEST 1 VIEW COMPARISON:  Portable chest x-ray of May 27, 2017 FINDINGS: The left lung is well-expanded. Minimal increased density is present in the retrocardiac region and is stable. On the right there is persistent patchy density in the lower lung with partial obscuration of the hemidiaphragm. There is no pneumothorax or large pleural effusion. The heart is normal in size. The pulmonary vascularity is not engorged. There is calcification in the wall of the aortic arch. The endotracheal tube tip lies 5.2 cm above the carina. The esophagogastric tube has its proximal port at or just above the GE junction with the tip in the region of the gastric cardia below the inferior margin of the image. The right PICC line tip projects over the junction of middle and distal thirds of the SVC. IMPRESSION: There is persistent right basilar atelectasis or pneumonia little changed from yesterday's study. Subtle density at the left lung base medially is stable and compatible with atelectasis as well. Advancement of the esophagogastric tube by 10  cm is recommended to assure that the proximal port rib is positioned below the GE junction. Appropriate positioning of the esophagogastric tube. Thoracic aortic atherosclerosis. Electronically Signed   By: David  Martinique M.D.   On: 05/28/2017 07:11   Dg Chest Port 1 View  Result Date: 05/27/2017 CLINICAL DATA:  Respiratory failure . EXAM: PORTABLE CHEST 1 VIEW COMPARISON:  05/26/2017 . FINDINGS: Endotracheal tube, NG tube, right PICC line in stable position . Heart size normal. Bibasilar atelectasis and infiltrates. No pleural effusion or pneumothorax . Cervical spine fusion. IMPRESSION: 1.  Lines and tubes in stable position. 2. Bibasilar atelectasis and infiltrates again noted. No change from prior exam. Electronically Signed   By: Marcello Moores   Register   On: 05/27/2017 07:16   Dg Chest Port 1 View  Result Date: 05/26/2017 CLINICAL DATA:  Respiratory failure. EXAM: PORTABLE CHEST 1 VIEW COMPARISON:  05/25/2017 . FINDINGS: Endotracheal tube, NG tube, right PICC line in stable position. Heart size normal. Low lung volumes with bibasilar atelectasis and infiltrates. Interim improvement from prior exam. No significant pleural effusion noted on today's exam. No pneumothorax . IMPRESSION: 1. Lines and tubes in stable position. 2. Low lung volumes with persistent bibasilar atelectasis and infiltrates. Interim improvement from prior exam. Electronically Signed   By: Marcello Moores  Register   On: 05/26/2017 06:39   Dg Chest Port 1 View  Result Date: 05/25/2017 CLINICAL DATA:  Intubation. EXAM: PORTABLE CHEST 1 VIEW COMPARISON:  05/24/2017 . FINDINGS: Endotracheal tube, NG tube, right PICC line stable position. Stable cardiomegaly. Persistent unchanged bibasilar atelectasis and infiltrates noted. Small left pleural effusion cannot be excluded. No pneumothorax. IMPRESSION: 1. Lines and tubes in stable position. 2. Persistent unchanged bibasilar atelectasis and infiltrates noted. Small left pleural effusion cannot be excluded. Electronically Signed   By: Marcello Moores  Register   On: 05/25/2017 06:08   Dg Chest Port 1 View  Result Date: 05/24/2017 CLINICAL DATA:  Endotracheal tube. EXAM: PORTABLE CHEST 1 VIEW COMPARISON:  05/23/2017 FINDINGS: Endotracheal tube terminates 5.5 cm above the carina. Enteric tube terminates over the proximal stomach with side hole overlying the distal esophagus. The cardiomediastinal silhouette is unchanged. Left basilar airspace opacities on the prior study have decreased. Right basilar opacities are unchanged. No sizable pleural effusion or pneumothorax is identified. IMPRESSION: Improved aeration of the left lung base. Unchanged right basilar infiltrates. Electronically Signed   By: Logan Bores M.D.   On: 05/24/2017 07:05   Dg Chest  Port 1 View  Result Date: 05/23/2017 CLINICAL DATA:  Acute respiratory failure. EXAM: PORTABLE CHEST 1 VIEW COMPARISON:  05/22/2017 FINDINGS: Endotracheal tube terminates 3- 3.5 cm above the carina. Enteric tube courses into the left upper abdomen with tip not imaged. The cardiomediastinal silhouette is unchanged. Left greater than right basilar lung opacities are unchanged. No large pleural effusion or pneumothorax is identified. IMPRESSION: Unchanged left greater than right basilar lung opacities which may reflect pneumonia. Electronically Signed   By: Logan Bores M.D.   On: 05/23/2017 07:30   Dg Chest Port 1 View  Result Date: 05/22/2017 CLINICAL DATA:  Acute respiratory failure, hypoxia EXAM: PORTABLE CHEST 1 VIEW COMPARISON:  05/21/2017 FINDINGS: Interval retraction of the endotracheal tube with the tip now 4 cm above the carina. NG tube enters the stomach. Bilateral perihilar and lower lobe airspace opacities are similar to prior study. Mild cardiomegaly. IMPRESSION: Interval repositioning of endotracheal tube, now 4 cm above the carina. Continued bilateral perihilar and lower lobe airspace opacities, left greater than right. Electronically Signed  By: Rolm Baptise M.D.   On: 05/22/2017 07:10   Dg Chest Port 1 View  Result Date: 05/21/2017 CLINICAL DATA:  Acute respiratory failure with hypoxia. EXAM: PORTABLE CHEST 1 VIEW COMPARISON:  Radiograph May 20, 2017. FINDINGS: Stable cardiomediastinal silhouette. Atherosclerosis of thoracic aorta is noted. No pneumothorax is noted. Right-sided PICC line is unchanged in position. Stable bibasilar atelectasis is noted. Nasogastric tube is seen entering stomach. Endotracheal tube appears to be slightly directed into right mainstem bronchus. IMPRESSION: Aortic atherosclerosis. Stable bibasilar subsegmental atelectasis. Endotracheal tube appears to be slightly directed into right mainstem bronchus, and withdrawal by 3 cm is recommended. These results will be  called to the ordering clinician or representative by the Radiologist Assistant, and communication documented in the PACS or zVision Dashboard. Electronically Signed   By: Marijo Conception, M.D.   On: 05/21/2017 07:32   Dg Chest Port 1 View  Result Date: 05/20/2017 CLINICAL DATA:  Acute respiratory failure. EXAM: PORTABLE CHEST 1 VIEW COMPARISON:  May 18, 2017 FINDINGS: The ETT terminates 11 mm above the carina. Recommend withdrawing 1 cm. The NG tube terminates in the left upper quadrant. A right PICC line terminates in the central SVC. No pneumothorax. Bibasilar opacities and possible small effusions are stable. No other interval changes. IMPRESSION: 1. The ETT terminates 11 mm above the carina. Recommend withdrawing 1 cm. 2. Stable bibasilar opacities. Possible small effusions. No other changes. Electronically Signed   By: Dorise Bullion III M.D   On: 05/20/2017 07:19   Dg Chest Port 1 View  Result Date: 05/18/2017 CLINICAL DATA:  Respiratory failure. EXAM: PORTABLE CHEST 1 VIEW COMPARISON:  05/17/2017 . FINDINGS: Endotracheal tube, NG tube, right PICC line in stable position. Heart size stable . Improved left upper lobe infiltrate/edema . Persistent diffuse bilateral pulmonary infiltrates/edema. Persistent left base atelectasis. Persistent small left pleural effusion. No pneumothorax. IMPRESSION: 1. Lines and tubes in stable position. 2. Improved left upper lobe infiltrate/edema. 3. Persistent bibasilar pulmonary infiltrates/edema. Persistent left base atelectasis. Persistent small left pleural effusion . Electronically Signed   By: Marcello Moores  Register   On: 05/18/2017 06:48   Dg Chest Port 1 View  Result Date: 05/17/2017 CLINICAL DATA:  Acute respiratory failure with hypoxemia EXAM: PORTABLE CHEST 1 VIEW COMPARISON:  05/16/2017 FINDINGS: Endotracheal tube in good position. NG tube in the stomach. PICC tip in the SVC. Progression of left upper lobe airspace disease with new airspace disease left upper  lobe. Right lower lobe airspace disease unchanged. Progression of left upper lobe airspace disease since the prior study. IMPRESSION: Progression of left lower lobe and left upper lobe airspace disease. No change right lower lobe airspace disease. Electronically Signed   By: Franchot Gallo M.D.   On: 05/17/2017 07:00   Dg Chest Port 1 View  Result Date: 05/16/2017 CLINICAL DATA:  Respiratory failure EXAM: PORTABLE CHEST 1 VIEW COMPARISON:  05/15/2017 FINDINGS: Endotracheal tube 3 cm above the carina. NG tube in the stomach. Interval placement of right arm PICC tip in the SVC Bibasilar airspace disease unchanged. Negative for heart failure or effusion IMPRESSION: PICC tip in the SVC. Bibasilar airspace disease unchanged. Electronically Signed   By: Franchot Gallo M.D.   On: 05/16/2017 07:25   Dg Chest Port 1 View  Result Date: 05/15/2017 CLINICAL DATA:  Respiratory failure.  Hypoxia. EXAM: PORTABLE CHEST 1 VIEW COMPARISON:  05/14/2017 . FINDINGS: Endotracheal tube is been repositioned, its tip is 3.2 cm above the carina. NG tube noted with tip coiled in  stomach. Heart size normal. Diffuse bilateral pulmonary infiltrates again noted. Left pulmonary infiltrate has improved prior exam. No prominent pleural effusion. No pneumothorax . IMPRESSION: 1. Interim endotracheal tube repositioning with its tip now 3.2 cm above the carina. NG tube noted with tip coiled in stomach. 2. Persistent bilateral pulmonary infiltrates. Left pulmonary infiltrate has improved from prior exam . 3. Stable cardiomegaly . Electronically Signed   By: Marcello Moores  Register   On: 05/15/2017 07:11   Dg Chest Port 1 View  Result Date: 05/14/2017 CLINICAL DATA:  Acute respiratory failure EXAM: PORTABLE CHEST 1 VIEW COMPARISON:  Yesterday FINDINGS: Endotracheal tube tip is shorter than before, now at the level the clavicular heads. An orogastric tube is no longer seen. Worsening inflation. Diffuse interstitial and airspace opacity, greater on  the left where there could be layering fluid. No pneumothorax. Grossly normal heart size. IMPRESSION: 1. Higher endotracheal tube, tip at the clavicular heads. An orogastric tube is no longer seen. 2. Lower lung volumes. Diffuse airspace disease suspicious for ARDS or multifocal infection/aspiration. Electronically Signed   By: Monte Fantasia M.D.   On: 05/14/2017 08:59   Dg Chest Port 1 View  Result Date: 05/14/2017 CLINICAL DATA:  Acute respiratory failure with hypoxemia, current smoker. EXAM: PORTABLE CHEST 1 VIEW COMPARISON:  05/11/2017 FINDINGS: Heart size is top normal with aortic atherosclerosis. Endotracheal tube tip is seen 3.4 cm above the carina. A gastric tube is noted in the expected location of the stomach with tip in the region of the gastric antrum. Interstitial edema is noted with central vascular congestion. Mild prominence of the right hilum is again noted which may reflect central vascular congestion and/or adenopathy. Superimposed pneumonia is not entirely excluded. Resolution of free intraperitoneal air beneath the right hemidiaphragm on current study. No acute nor suspicious osseous abnormality. IMPRESSION: 1. Satisfactory support line and tube positions. 2. Bilateral diffuse interstitial edema is again noted with slight improvement in aeration at the right lung base and resolution of pneumoperitoneum. 3. Right hilar prominence is seen similar to prior and may reflect adenopathy, vascular summation or possibly superimposed pneumonia. Electronically Signed   By: Ashley Royalty M.D.   On: 05/14/2017 03:58   Dg Chest Port 1 View  Result Date: 05/09/2017 CLINICAL DATA:  56 year old male with history of shortness of breath. History of prior inguinal hernia repair. EXAM: PORTABLE CHEST 1 VIEW COMPARISON:  Chest x-ray 05/07/2017. FINDINGS: Elevated right hemidiaphragm with evidence of pneumoperitoneum beneath the right hemidiaphragm. Opacities at the right lung base likely reflect subsegmental  atelectasis, although underlying airspace consolidation is not excluded. Patchy areas of interstitial prominence are noted in the lungs bilaterally, most significant in the right mid to upper lung, concerning for potential developing bronchopneumonia. Small right pleural effusion. No left pleural effusion. No evidence of pulmonary edema. Heart size is normal. Upper mediastinal contours are within normal limits. Aortic atherosclerosis. Orthopedic fixation hardware in the lower cervical spine. IMPRESSION: 1. Findings are concerning for developing multilobar bronchopneumonia. 2. Worsening atelectasis and/or consolidation in the right lower lobe. Small right pleural effusion. 3. Persistent pneumoperitoneum beneath the right hemidiaphragm, presumably iatrogenic related to recent surgery. Electronically Signed   By: Vinnie Langton M.D.   On: 05/09/2017 23:48   Dg Chest Port 1 View  Result Date: 05/07/2017 CLINICAL DATA:  Rapid respiration. Abdominal distension and shortness of breath. Laparoscopic adrenalectomy and small bowel resection with reanastomosis May 06, 2017. EXAM: PORTABLE CHEST 1 VIEW COMPARISON:  Mar 05, 2015 FINDINGS: Free air seen under the  right hemidiaphragm consistent with surgery yesterday. No pneumothorax. The cardiomediastinal silhouette is normal. Bibasilar opacities are likely atelectasis. IMPRESSION: 1. Free air under the right hemidiaphragm consistent with surgery yesterday. 2. Bibasilar opacities, likely atelectasis. Electronically Signed   By: Dorise Bullion III M.D   On: 05/07/2017 21:04   Dg Abd Acute W/chest  Result Date: 05/11/2017 CLINICAL DATA:  Shortness of breath and abdominal pain EXAM: DG ABDOMEN ACUTE W/ 1V CHEST COMPARISON:  Chest x-ray from 2 days pper FINDINGS: Progressive diffuse airspace opacity. Lung volumes are low. Prominent heart size, accentuated by technique. No pneumothorax. Recent abdominal surgery with similar appearing pneumoperitoneum. Diffuse colonic,  gastric, and small bowel dilatation consistent with an ileus in this setting. Surgical clips and bowel sutures. IMPRESSION: 1. Progressive bilateral airspace disease which could be edema (likely noncardiogenic), infection, or aspiration. 2. Ileus pattern. Electronically Signed   By: Monte Fantasia M.D.   On: 05/11/2017 08:41    Labs:  CBC:  Recent Labs  05/26/17 0414 05/27/17 0441 05/29/17 0316 05/30/17 0430  WBC 15.8* 14.2* 16.3* 16.4*  HGB 7.6* 7.1* 7.2* 7.8*  HCT 23.6* 21.6* 21.6* 23.5*  PLT 894* 847* 852* 941*    COAGS:  Recent Labs  05/04/17 1018 05/30/17 0900  INR 1.00 1.21  APTT 36 54*    BMP:  Recent Labs  05/27/17 0441 05/28/17 0211 05/29/17 0316 05/30/17 0430  NA 133* 135 134* 132*  K 4.0 4.2 3.8 3.7  CL 96* 96* 98* 95*  CO2 31 32 28 25  GLUCOSE 130* 115* 161* 113*  BUN 23* 21* 18 17  CALCIUM 8.3* 8.3* 8.3* 8.6*  CREATININE 0.69 0.69 0.54* 0.57*  GFRNONAA >60 >60 >60 >60  GFRAA >60 >60 >60 >60    LIVER FUNCTION TESTS:  Recent Labs  05/23/17 0345 05/25/17 0509 05/26/17 0414 05/28/17 0211  BILITOT 0.4 0.3 0.3 0.5  AST 25 20 24  38  ALT 13* 12* 12* 18  ALKPHOS 67 80 97 215*  PROT 5.3* 6.1* 6.6 6.7  ALBUMIN 1.5* 1.6* 1.7* 1.7*    TUMOR MARKERS: No results for input(s): AFPTM, CEA, CA199, CHROMGRNA in the last 8760 hours.  Assessment and Plan:  S/P adrenalectomy with complicated course, now with RUQ abscess.  Images reviewed by Dr. Anselm Pancoast and Dr. Laurence Ferrari.  Wound vac in place RUQ. Should be able to navigate around the vac without removing it.  Will proceed with image guided aspiration of the fluid collection.  Risks and benefits of the aspiration were discussed with the patient and his wife including bleeding, infection, damage to adjacent structures, bowel perforation/fistula connection, and sepsis.  All of the patient's and wife's questions were answered, patient is agreeable to proceed. Consent signed and in chart.  Thank you  for this interesting consult.  I greatly enjoyed meeting DARNELL JESCHKE and look forward to participating in their care.  A copy of this report was sent to the requesting provider on this date.  Electronically Signed: Murrell Redden, PA-C 05/30/2017, 10:17 AM   I spent a total of 40 Minutes in face to face in clinical consultation, greater than 50% of which was counseling/coordinating care for RUQ fluid collection.

## 2017-05-30 NOTE — Procedures (Signed)
Interventional Radiology Procedure Note  Procedure: US aspiration of small perihepatic fluid collection yields minimal bloody fluid. 1 mL aspirated and sent for Cx    Complications: None  Estimated Blood Loss: None  Recommendations: - Cx pending  Signed,  Criselda Peaches, MD

## 2017-05-30 NOTE — Progress Notes (Signed)
Pt has continuous c/o pain. Pt requests additional pain medicine and states pain is uncontrolled.  RN has given multiple 1 mg PRN Dilaudid boluses, with little relief. Pt states that he is anxious and unable to rest. RN notified E-link MD. Dilaudid gtt was increased to 5 mg/hr and Precedex gtt initiaited at 0.2 mcg/kg/hr. Pt also has c/o "feeling of fullness" in his abdomen. RN has tried alternatives (repositioning, aroma therapy, irrigating GI tubes) but was unable to control pain.  Pt refusing pain medicine per Tube r/t feeling of fullness/ discomfort in his abdomen.  Pt states that he has had increased abdominal discomfort/ "fullness" since swallowing contrast prior to abdominal CT.  MD has been notified and RN will continue to monitor.

## 2017-05-30 NOTE — Progress Notes (Signed)
[facility] Progress Note Patient Name: [name] [name] DOB: [birth year] MRN: [id number]   Date of Service  05/30/2017  HPI/Events of Note  Continued persistent pain despite increase in dilaudid.  Now c/o of abd fullness and concern that the oral oxycodone may not be absorbing.    eICU Interventions  Restart precedex for tonight Consider pain management consult on AM rounds        [place] 05/30/2017, 1:41 AM

## 2017-05-30 NOTE — H&P (Signed)
Chief Complaint: RUQ fluid collection  Referring Physician(s): [name], Arta Bruce  Supervising Physician: Jacqulynn Cadet  Patient Status: [facility] - In-pt  History of Present Illness: [name] [name] is a 56 y.o. male 56 year old man underwent elective right adrenalectomy 0/86 which was complicated by small bowel injury requiring resection and anastomosis and an anastomotic leak requiring repair on 7/18.   He went back to the OR on 7/22 for washout and was was left with an open abdomen with a wound vac in place.  He had required intubation with mechanical ventilation x 2 weeks and was extubated on 05/28/2017.  His course complicated by peritonitis, MSSA pneumonia and severe agitation and pain requiring sedative drips.  He developed increasing abdominal pain and fever yesterday and CT scan was obtained.  CT revealed thick-walled fluid collection along the anterior and lateral aspects of the liver. The collection is thin measuring approximately 2 cm greatest thickness. This is a presumed abscess collection.  Past Medical History:  Diagnosis Date  . Anemia   . Anxiety   . Anxiety state 05/10/2017  . Arthritis    neck, shoulder, left knee  . 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 Erythrocytosis 11/30/2014  . Essential hypertension    under control with med., has been on med. x 1 yr.  Marland Kitchen GERD (gastroesophageal reflux disease)   . Headache(784.0)    2 x/week  . History of kidney stones   . Hypothyroidism    hx of   . Leukocytosis, unspecified 11/03/2013  . Limited joint range of motion    cervical fusion 02/2013  . Medial meniscus tear 06/2013   left knee  . Midsternal chest pain 03/05/2015  . Thyroid nodule   . Tobacco abuse   . Wears partial dentures    upper    Past Surgical History:  Procedure Laterality Date  . ANTERIOR CERVICAL DECOMP/DISCECTOMY FUSION N/A 03/18/2013   Procedure: ANTERIOR CERVICAL DECOMPRESSION/DISCECTOMY FUSION 3 LEVELS;  Surgeon: Erline Levine, MD;   Location: [place];  Service: Neurosurgery;  Laterality: N/A;  Anterior Cervical Three-Six  Decompression/Diskectomy/Fusion  . APPENDECTOMY    . CIRCUMCISION  05/04/2006  . COLONOSCOPY    . EYE SURGERY     bilateral cataract surgery   . HEMICOLECTOMY Right 5/78/4696   with ileocolic anastomosis  . INGUINAL HERNIA REPAIR    . KNEE ARTHROSCOPY Left 08/01/2013   Procedure: LEFT ARTHROSCOPY KNEE;  Surgeon: Kerin Salen, MD;  Location: [name];  Service: Orthopedics;  Laterality: Left;  . LAPAROSCOPIC ADRENALECTOMY Right 05/06/2017   Procedure: OPEN RIGHT ADRENALECTOMY WITH SMALL BOWEL RESECTION AND ANASTOMOSIS;  Surgeon: Kieth Brightly, Arta Bruce, MD;  Location: [facility];  Service: General;  Laterality: Right;  . LAPAROTOMY N/A 05/13/2017   Procedure: EXPLORATORY LAPAROTOMY, with wound dehisense, and repair of anastamotic leak;  Surgeon: [name], Arta Bruce, MD;  Location: [facility];  Service: General;  Laterality: N/A;  . LAPAROTOMY N/A 05/14/2017   Procedure: EXPLORATORY LAPAROTOMY, LYSIS OF ADHESIONS/ SMALL BOWEL RESECTION/ WASHOUT OF ABDOMEN AND PLACEMENT OF NEGATIVE PRESSURE DRESSING;  Surgeon: [name], Arta Bruce, MD;  Location: [facility];  Service: General;  Laterality: N/A;  . LAPAROTOMY N/A 05/17/2017   Procedure: EXPLORATORY LAPAROTOMY, PLACEMENT OF ILEOSTOMY TUBES, PARTIAL CLOSURE OF FASCIA, WOUND VAC EXCHANGE;  Surgeon: [name], Arta Bruce, MD;  Location: [facility];  Service: General;  Laterality: N/A;  . LAPAROTOMY N/A 05/20/2017   Procedure: ABDOMINAL WOUND [place] OUT, WOUND CLOSURE AND WOUND [place];  Surgeon: Kieth Brightly, Arta Bruce, MD;  Location: [facility];  Service: General;  Laterality: N/A;  . SHOULDER ARTHROSCOPY W/ ROTATOR CUFF REPAIR Left 07/16/2010  . SMALL INTESTINE SURGERY  04/24/2000   adhesiolysis, ileocolic anastomosis  . UPPER GASTROINTESTINAL ENDOSCOPY      Allergies: Chantix [varenicline]; Morphine; and Penicillins  Medications: Prior to Admission  medications   Medication Sig Start Date End Date Taking? Authorizing Provider  acetaminophen (TYLENOL) 500 MG tablet Take 1,500 mg by mouth daily as needed for mild pain.    Yes [provider]  atorvastatin (LIPITOR) 10 MG tablet Take 10 mg by mouth daily.   Yes [provider]  chlorthalidone (HYGROTON) 25 MG tablet Take 12.5 mg by mouth daily.  02/11/17  Yes [provider]  cloNIDine (CATAPRES) 0.1 MG tablet Take 0.1 mg by mouth 2 (two) times daily. 04/08/17  Yes [provider]  gabapentin (NEURONTIN) 100 MG capsule Take 100 mg by mouth 2 (two) times daily as needed (nerve pain).  11/14/14  Yes [provider]  ibuprofen (ADVIL,MOTRIN) 200 MG tablet Take 600 mg by mouth daily as needed for headache. Reported on 03/18/2016   Yes [provider]  LORazepam (ATIVAN) 1 MG tablet Take 1 mg by mouth 2 (two) times daily as needed for anxiety.   Yes [provider]  methocarbamol (ROBAXIN) 500 MG tablet Take 1 tablet (500 mg total) by mouth 2 (two) times daily. Patient taking differently: Take 500 mg by mouth daily as needed for muscle spasms.  10/05/15  Yes Larene Pickett, PA-C  Multiple Vitamin (MULTIVITAMIN WITH MINERALS) TABS Take 1 tablet by mouth daily.   Yes [provider]  omeprazole (PRILOSEC) 40 MG capsule Take 1 capsule (40 mg total) by mouth 2 (two) times daily. Patient taking differently: Take 40 mg by mouth daily.  07/22/13  Yes Theodis Blaze, MD  oxyCODONE-acetaminophen (PERCOCET) 10-325 MG per tablet Take 1-2 tablets by mouth every 6 (six) hours as needed for pain. 08/25/13  Yes Katheren Shams, MD  Potassium Chloride ER 20 MEQ TBCR Take 20 mEq by mouth daily. 02/18/17  Yes [provider]  sildenafil (VIAGRA) 100 MG tablet Take 100 mg by mouth daily as needed for erectile dysfunction.   Yes [provider]  tadalafil (CIALIS) 20 MG tablet Take 20 mg by mouth daily as needed for erectile dysfunction.    Yes [provider]  valsartan (DIOVAN) 80 MG tablet Take 80 mg by mouth daily. 03/03/17  Yes [provider]  labetalol (NORMODYNE) 100 MG tablet Take 0.5 tablets (50 mg total) by mouth 2 (two) times daily. Patient not taking: Reported on 04/28/2017 03/28/17   Renato Shin, MD     Family History  Problem Relation Age of Onset  . Cancer Father 60       colon ca - deceased.  . Colon cancer Father   . Cancer Sister 59       breast ca  . Thyroid disease Neg Hx     Social History   Social History  . Marital status: Married    Spouse name: N/A  . Number of children: N/A  . Years of education: N/A   Social History Main Topics  . Smoking status: Current Every Day Smoker    Packs/day: 1.00    Years: 25.00    Types: Cigarettes  . Smokeless tobacco: Never Used  . Alcohol use Yes     Comment: occasionally  . Drug use: No  .  Sexual activity: Not Asked   Other Topics Concern  . None   Social History Narrative   Lives in [place] with his wife.    Review of Systems  Unable to perform ROS: Other  Recent pain medication administration  Vital Signs: BP 120/77 (BP Location: Left Arm)   Pulse 87   Temp 99.6 F (37.6 C) (Oral)   Resp (!) 22   Ht 5\' 10"  (1.778 m)   Wt 205 lb 11 oz (93.3 kg)   SpO2 100%   BMI 29.51 kg/m   Physical Exam  Constitutional: He is oriented to person, place, and time. He appears well-developed.  HENT:  Head: Normocephalic and atraumatic.  Eyes: EOM are normal.  Neck: Normal range of motion.  Cardiovascular: Normal rate, regular rhythm and normal heart sounds.   Pulmonary/Chest: Effort normal and breath sounds normal.  Abdominal:    Large defect RUQ = wound vac  Neurological: He is alert and oriented to person, place, and time.  Vitals reviewed.      Imaging: Dg Chest 1 View  Result Date: 05/20/2017 CLINICAL DATA:  Postoperative desaturation. EXAM: CHEST 1 VIEW COMPARISON:  Portable chest x-ray of May 20, 2017 FINDINGS: The  lungs are adequately inflated. There is persistent increased density in the retrocardiac region on the left with obscuration of the hemidiaphragm and blunting of the lateral costophrenic angle. On the right there is patchy increased density just above the hemidiaphragm which is stable. There is no pneumothorax. The heart and pulmonary vascularity are normal. There is calcification wall of the aortic arch. The endotracheal tube lies approximately 12 mm above the carina. The esophagogastric tube's proximal port lies at or just below the GE junction with the tip projecting below the inferior margin of the image. The PICC line tip projects over the midportion of the SVC. IMPRESSION: Persistent low positioning of the endotracheal tube. Withdrawal by 2 cm is recommended in an effort to avoid accidental mainstem bronchus intubation with patient movement. Persistent bibasilar atelectasis or pneumonia. Small left pleural effusion. Thoracic aortic atherosclerosis. Electronically Signed   By: David  Martinique M.D.   On: 05/20/2017 13:31   Ct Abdomen Pelvis W Contrast  Result Date: 05/29/2017 CLINICAL DATA:  Status post abdominal wound washout, wound closure and wound VAC placement on July 25th. Abdominal pain and fever. Abscess suspected. EXAM: CT ABDOMEN AND PELVIS WITH CONTRAST TECHNIQUE: Multidetector CT imaging of the abdomen and pelvis was performed using the standard protocol following bolus administration of intravenous contrast. CONTRAST:  29mL ISOVUE-300 IOPAMIDOL (ISOVUE-300) INJECTION 61%, 118mL ISOVUE-300 IOPAMIDOL (ISOVUE-300) INJECTION 61% COMPARISON:  None. FINDINGS: Lower chest: Small right pleural effusion. Mild bibasilar atelectasis. Hepatobiliary: No focal liver abnormality is seen. Gallbladder is mildly distended but otherwise unremarkable. No bile duct dilatation seen. Pancreas: Unremarkable. No pancreatic ductal dilatation or surrounding inflammatory changes. Spleen: Normal in size without focal  abnormality. Adrenals/Urinary Tract: Status post right adrenalectomy. Fluid collection in the right adrenalectomy bed has decreased in size, now measuring approximately 4 x 2 cm (previously 5 x 4 cm). Left adrenal gland appears normal. Kidneys are unremarkable without mass or hydronephrosis. Small amount of air in the bladder, likely related to recent catheterization. Bladder otherwise unremarkable. Stomach/Bowel: Status post partial colectomy with surgical bowel anastomoses in the right upper quadrant. Two percutaneous jejunostomy tubes in place, grossly appropriate in position. Additional percutaneous drainage catheter in place with tip projecting across the midline into the anterior left abdomen. No dilated large or small bowel loops.  Enteric tube  in the stomach. Vascular/Lymphatic: Aortic atherosclerosis. No acute appearing vascular abnormality. Scattered small and mildly prominent lymph nodes within the abdomen and pelvis, likely reactive in nature. Reproductive: Prostate is unremarkable. Other: Small amount of free fluid in the abdomen and pelvis. Probable thin abscess collection along the anterior and lateral aspects of the liver, measuring approximately 2 cm greatest thickness, extending for approximately 20 cm along the anterolateral aspects of the liver. No free intraperitoneal air seen. Expected foci of extraperitoneal air seen along the right abdominal wall surgical defect. Musculoskeletal: No acute or suspicious osseous finding. IMPRESSION: 1. Thick-walled fluid collection along the anterior and lateral aspects of the liver. The collection is thin measuring approximately 2 cm greatest thickness (measured on the coronal reconstructions series 5, image 24) but extends for approximately 20 cm along the anterior and lateral margins of the liver (axial series 2, image 22). This is a presumed abscess collection. 2. No evidence of additional abscess collection within the abdomen or pelvis. 3. Small free fluid  in the abdomen and pelvis. 4. Nonobstructive bowel gas pattern. Surgical changes of partial bowel resection. No evidence of surgical complicating feature seen. 5. Small right pleural effusion. Small consolidations at each lung base are most likely atelectasis. Pneumonia or aspiration pneumonitis are possible but considered less likely. 6. Support apparatus appears appropriately positioned. 7. Decrease in size of the fluid collection at the right adrenalectomy bed, now measuring 4 x 2 cm (previously 5 x 4 cm). 8. Aortic atherosclerosis. These results will be called to the ordering clinician or representative by the Radiologist Assistant, and communication documented in the PACS or zVision Dashboard. Electronically Signed   By: Franki Cabot M.D.   On: 05/29/2017 17:15   Ct Abdomen Pelvis W Contrast  Result Date: 05/13/2017 CLINICAL DATA:  Inpatient. Ileus. Severe abdominal pain. Status post laparoscopic converted to open right adrenalectomy on 19/41/7408 complicated by small bowel injury requiring small bowel resection with dense adhesions throughout the abdomen. Prior appendectomy and right hemicolectomy. EXAM: CT ABDOMEN AND PELVIS WITH CONTRAST TECHNIQUE: Multidetector CT imaging of the abdomen and pelvis was performed using the standard protocol following bolus administration of intravenous contrast. CONTRAST:  144mL ISOVUE-300 IOPAMIDOL (ISOVUE-300) INJECTION 61% COMPARISON:  05/11/2017 abdominal radiographs. 09/15/2015 CT abdomen/pelvis. 12/16/2016 chest CT. FINDINGS: Lower chest: Small dependent right pleural effusion. Patchy consolidation and ground-glass attenuation throughout both lung bases with some volume loss. Hepatobiliary: Normal liver size. No discrete liver mass. Mild heterogeneity in the anterior inferior right liver lobe near the gallbladder fossa may be artifactual versus mild postsurgical change. Normal gallbladder with no radiopaque cholelithiasis. No biliary ductal dilatation. Pancreas:  Normal, with no mass or duct dilation. Spleen: Normal size. No mass. Adrenals/Urinary Tract: Status post right adrenalectomy. Ill-defined 5.1 x 3.8 cm simple appearing fluid collection in the right adrenalectomy bed (series 2/ image 36) without internal gas or associated wall thickening. No left adrenal nodules. No hydronephrosis. No renal masses. Stomach/Bowel: Distended fluid-filled stomach with no appreciable gastric wall thickening or pneumatosis. There is diffuse dilatation of the small bowel up to 5.6 cm diameter, with fluid levels throughout the small bowel. Surgical sutures are noted in the right abdomen from enteroenterostomy sites. No discrete small bowel caliber transition site. No small bowel wall thickening. Status post right hemicolectomy with intact appearing ileocolic anastomosis. Mild diffuse dilatation of the large bowel with fluid levels throughout the large bowel. No large bowel wall thickening. Vascular/Lymphatic: Atherosclerotic nonaneurysmal abdominal aorta. Patent portal, splenic, hepatic and renal veins. No pathologically enlarged  lymph nodes in the abdomen or pelvis. Reproductive: Top-normal size prostate with nonspecific internal prostatic calcifications. Other: Pneumoperitoneum under the right hemidiaphragm and in the ventral right abdominal wall. No additional focal fluid collections. Open wound in the ventral right upper abdominal wall. Mild dependent superficial edema in the low back and lateral gluteal regions. Musculoskeletal: No aggressive appearing focal osseous lesions. IMPRESSION: 1. Diffuse dilatation of the small greater the large bowel with fluid levels throughout the small and large bowel, most compatible with adynamic ileus in the postoperative state. No bowel wall thickening. No discrete small bowel caliber transition to suggest obstruction. 2. Pneumoperitoneum in the right upper quadrant is presumably due to recent surgery . 3. Small postoperative fluid collection at the  right adrenalectomy site without wall thickening or internal gas. 4. Small dependent right pleural effusion. 5. Patchy bibasilar lung consolidation and ground-glass attenuation with some volume loss, concerning for multilobar pneumonia and/or aspiration with some atelectasis. Electronically Signed   By: Ilona Sorrel M.D.   On: 05/13/2017 18:01   Dg Chest Port 1 View  Result Date: 05/30/2017 CLINICAL DATA:  History of total adrenalectomy. EXAM: PORTABLE CHEST 1 VIEW COMPARISON:  Prior radiograph from 05/29/2017. FINDINGS: Enteric tube courses in the abdomen. Cardiac and mediastinal silhouettes are stable. Aortic atherosclerosis. Right PICC catheter terminates at the cavoatrial junction. Elevation the right hemidiaphragm with associated right basilar opacity, atelectasis or infiltrate. Mild left basilar subsegmental atelectasis. No appreciable pleural effusion. Perihilar vascular congestion without pulmonary edema. No pneumothorax. No acute osseus abnormality.  Cervical ACDF noted. IMPRESSION: 1. Elevation of the right hemidiaphragm with similar right basilar opacity, either atelectasis or infiltrate. Mild left basilar subsegmental atelectasis. 2. Stable position of right PICC catheter. Electronically Signed   By: Jeannine Boga M.D.   On: 05/30/2017 06:34   Dg Chest Port 1 View  Result Date: 05/29/2017 CLINICAL DATA:  Respiratory failure. EXAM: PORTABLE CHEST 1 VIEW COMPARISON:  05/28/2017. FINDINGS: Interim extubation. NG tube and right PICC line in stable position. Heart size normal. Stable right base atelectasis and infiltrate. Small left pleural effusion. Cervical spine fusion. IMPRESSION: 1. Interim extubation. NG tube and right PICC line in stable position. 2. Stable right base atelectasis/infiltrate. Mild left base subsegmental atelectasis. Electronically Signed   By: Marcello Moores  Register   On: 05/29/2017 06:40   Dg Chest Port 1 View  Result Date: 05/28/2017 CLINICAL DATA:  Intubated patient,  respiratory failure patient's proximally 10 days post laparotomy and subsequent abdominal wound clean out. EXAM: PORTABLE CHEST 1 VIEW COMPARISON:  Portable chest x-ray of May 27, 2017 FINDINGS: The left lung is well-expanded. Minimal increased density is present in the retrocardiac region and is stable. On the right there is persistent patchy density in the lower lung with partial obscuration of the hemidiaphragm. There is no pneumothorax or large pleural effusion. The heart is normal in size. The pulmonary vascularity is not engorged. There is calcification in the wall of the aortic arch. The endotracheal tube tip lies 5.2 cm above the carina. The esophagogastric tube has its proximal port at or just above the GE junction with the tip in the region of the gastric cardia below the inferior margin of the image. The right PICC line tip projects over the junction of middle and distal thirds of the SVC. IMPRESSION: There is persistent right basilar atelectasis or pneumonia little changed from yesterday's study. Subtle density at the left lung base medially is stable and compatible with atelectasis as well. Advancement of the esophagogastric tube by  10 cm is recommended to assure that the proximal port rib is positioned below the GE junction. Appropriate positioning of the esophagogastric tube. Thoracic aortic atherosclerosis. Electronically Signed   By: David  Martinique M.D.   On: 05/28/2017 07:11   Dg Chest Port 1 View  Result Date: 05/27/2017 CLINICAL DATA:  Respiratory failure . EXAM: PORTABLE CHEST 1 VIEW COMPARISON:  05/26/2017 . FINDINGS: Endotracheal tube, NG tube, right PICC line in stable position . Heart size normal. Bibasilar atelectasis and infiltrates. No pleural effusion or pneumothorax . Cervical spine fusion. IMPRESSION: 1.  Lines and tubes in stable position. 2. Bibasilar atelectasis and infiltrates again noted. No change from prior exam. Electronically Signed   By: Marcello Moores  Register   On: 05/27/2017  07:16   Dg Chest Port 1 View  Result Date: 05/26/2017 CLINICAL DATA:  Respiratory failure. EXAM: PORTABLE CHEST 1 VIEW COMPARISON:  05/25/2017 . FINDINGS: Endotracheal tube, NG tube, right PICC line in stable position. Heart size normal. Low lung volumes with bibasilar atelectasis and infiltrates. Interim improvement from prior exam. No significant pleural effusion noted on today's exam. No pneumothorax . IMPRESSION: 1. Lines and tubes in stable position. 2. Low lung volumes with persistent bibasilar atelectasis and infiltrates. Interim improvement from prior exam. Electronically Signed   By: Marcello Moores  Register   On: 05/26/2017 06:39   Dg Chest Port 1 View  Result Date: 05/25/2017 CLINICAL DATA:  Intubation. EXAM: PORTABLE CHEST 1 VIEW COMPARISON:  05/24/2017 . FINDINGS: Endotracheal tube, NG tube, right PICC line stable position. Stable cardiomegaly. Persistent unchanged bibasilar atelectasis and infiltrates noted. Small left pleural effusion cannot be excluded. No pneumothorax. IMPRESSION: 1. Lines and tubes in stable position. 2. Persistent unchanged bibasilar atelectasis and infiltrates noted. Small left pleural effusion cannot be excluded. Electronically Signed   By: Marcello Moores  Register   On: 05/25/2017 06:08   Dg Chest Port 1 View  Result Date: 05/24/2017 CLINICAL DATA:  Endotracheal tube. EXAM: PORTABLE CHEST 1 VIEW COMPARISON:  05/23/2017 FINDINGS: Endotracheal tube terminates 5.5 cm above the carina. Enteric tube terminates over the proximal stomach with side hole overlying the distal esophagus. The cardiomediastinal silhouette is unchanged. Left basilar airspace opacities on the prior study have decreased. Right basilar opacities are unchanged. No sizable pleural effusion or pneumothorax is identified. IMPRESSION: Improved aeration of the left lung base. Unchanged right basilar infiltrates. Electronically Signed   By: Logan Bores M.D.   On: 05/24/2017 07:05   Dg Chest Port 1 View  Result Date:  05/23/2017 CLINICAL DATA:  Acute respiratory failure. EXAM: PORTABLE CHEST 1 VIEW COMPARISON:  05/22/2017 FINDINGS: Endotracheal tube terminates 3- 3.5 cm above the carina. Enteric tube courses into the left upper abdomen with tip not imaged. The cardiomediastinal silhouette is unchanged. Left greater than right basilar lung opacities are unchanged. No large pleural effusion or pneumothorax is identified. IMPRESSION: Unchanged left greater than right basilar lung opacities which may reflect pneumonia. Electronically Signed   By: Logan Bores M.D.   On: 05/23/2017 07:30   Dg Chest Port 1 View  Result Date: 05/22/2017 CLINICAL DATA:  Acute respiratory failure, hypoxia EXAM: PORTABLE CHEST 1 VIEW COMPARISON:  05/21/2017 FINDINGS: Interval retraction of the endotracheal tube with the tip now 4 cm above the carina. NG tube enters the stomach. Bilateral perihilar and lower lobe airspace opacities are similar to prior study. Mild cardiomegaly. IMPRESSION: Interval repositioning of endotracheal tube, now 4 cm above the carina. Continued bilateral perihilar and lower lobe airspace opacities, left greater than right. Electronically  Signed   By: Rolm Baptise M.D.   On: 05/22/2017 07:10   Dg Chest Port 1 View  Result Date: 05/21/2017 CLINICAL DATA:  Acute respiratory failure with hypoxia. EXAM: PORTABLE CHEST 1 VIEW COMPARISON:  Radiograph May 20, 2017. FINDINGS: Stable cardiomediastinal silhouette. Atherosclerosis of thoracic aorta is noted. No pneumothorax is noted. Right-sided PICC line is unchanged in position. Stable bibasilar atelectasis is noted. Nasogastric tube is seen entering stomach. Endotracheal tube appears to be slightly directed into right mainstem bronchus. IMPRESSION: Aortic atherosclerosis. Stable bibasilar subsegmental atelectasis. Endotracheal tube appears to be slightly directed into right mainstem bronchus, and withdrawal by 3 cm is recommended. These results will be called to the ordering  clinician or representative by the Radiologist Assistant, and communication documented in the PACS or zVision Dashboard. Electronically Signed   By: Marijo Conception, M.D.   On: 05/21/2017 07:32   Dg Chest Port 1 View  Result Date: 05/20/2017 CLINICAL DATA:  Acute respiratory failure. EXAM: PORTABLE CHEST 1 VIEW COMPARISON:  May 18, 2017 FINDINGS: The ETT terminates 11 mm above the carina. Recommend withdrawing 1 cm. The NG tube terminates in the left upper quadrant. A right PICC line terminates in the central SVC. No pneumothorax. Bibasilar opacities and possible small effusions are stable. No other interval changes. IMPRESSION: 1. The ETT terminates 11 mm above the carina. Recommend withdrawing 1 cm. 2. Stable bibasilar opacities. Possible small effusions. No other changes. Electronically Signed   By: Dorise Bullion III M.D   On: 05/20/2017 07:19   Dg Chest Port 1 View  Result Date: 05/18/2017 CLINICAL DATA:  Respiratory failure. EXAM: PORTABLE CHEST 1 VIEW COMPARISON:  05/17/2017 . FINDINGS: Endotracheal tube, NG tube, right PICC line in stable position. Heart size stable . Improved left upper lobe infiltrate/edema . Persistent diffuse bilateral pulmonary infiltrates/edema. Persistent left base atelectasis. Persistent small left pleural effusion. No pneumothorax. IMPRESSION: 1. Lines and tubes in stable position. 2. Improved left upper lobe infiltrate/edema. 3. Persistent bibasilar pulmonary infiltrates/edema. Persistent left base atelectasis. Persistent small left pleural effusion . Electronically Signed   By: Marcello Moores  Register   On: 05/18/2017 06:48   Dg Chest Port 1 View  Result Date: 05/17/2017 CLINICAL DATA:  Acute respiratory failure with hypoxemia EXAM: PORTABLE CHEST 1 VIEW COMPARISON:  05/16/2017 FINDINGS: Endotracheal tube in good position. NG tube in the stomach. PICC tip in the SVC. Progression of left upper lobe airspace disease with new airspace disease left upper lobe. Right lower lobe  airspace disease unchanged. Progression of left upper lobe airspace disease since the prior study. IMPRESSION: Progression of left lower lobe and left upper lobe airspace disease. No change right lower lobe airspace disease. Electronically Signed   By: Franchot Gallo M.D.   On: 05/17/2017 07:00   Dg Chest Port 1 View  Result Date: 05/16/2017 CLINICAL DATA:  Respiratory failure EXAM: PORTABLE CHEST 1 VIEW COMPARISON:  05/15/2017 FINDINGS: Endotracheal tube 3 cm above the carina. NG tube in the stomach. Interval placement of right arm PICC tip in the SVC Bibasilar airspace disease unchanged. Negative for heart failure or effusion IMPRESSION: PICC tip in the SVC. Bibasilar airspace disease unchanged. Electronically Signed   By: Franchot Gallo M.D.   On: 05/16/2017 07:25   Dg Chest Port 1 View  Result Date: 05/15/2017 CLINICAL DATA:  Respiratory failure.  Hypoxia. EXAM: PORTABLE CHEST 1 VIEW COMPARISON:  05/14/2017 . FINDINGS: Endotracheal tube is been repositioned, its tip is 3.2 cm above the carina. NG tube noted with  tip coiled in stomach. Heart size normal. Diffuse bilateral pulmonary infiltrates again noted. Left pulmonary infiltrate has improved prior exam. No prominent pleural effusion. No pneumothorax . IMPRESSION: 1. Interim endotracheal tube repositioning with its tip now 3.2 cm above the carina. NG tube noted with tip coiled in stomach. 2. Persistent bilateral pulmonary infiltrates. Left pulmonary infiltrate has improved from prior exam . 3. Stable cardiomegaly . Electronically Signed   By: Marcello Moores  Register   On: 05/15/2017 07:11   Dg Chest Port 1 View  Result Date: 05/14/2017 CLINICAL DATA:  Acute respiratory failure EXAM: PORTABLE CHEST 1 VIEW COMPARISON:  Yesterday FINDINGS: Endotracheal tube tip is shorter than before, now at the level the clavicular heads. An orogastric tube is no longer seen. Worsening inflation. Diffuse interstitial and airspace opacity, greater on the left where there  could be layering fluid. No pneumothorax. Grossly normal heart size. IMPRESSION: 1. Higher endotracheal tube, tip at the clavicular heads. An orogastric tube is no longer seen. 2. Lower lung volumes. Diffuse airspace disease suspicious for ARDS or multifocal infection/aspiration. Electronically Signed   By: Monte Fantasia M.D.   On: 05/14/2017 08:59   Dg Chest Port 1 View  Result Date: 05/14/2017 CLINICAL DATA:  Acute respiratory failure with hypoxemia, current smoker. EXAM: PORTABLE CHEST 1 VIEW COMPARISON:  05/11/2017 FINDINGS: Heart size is top normal with aortic atherosclerosis. Endotracheal tube tip is seen 3.4 cm above the carina. A gastric tube is noted in the expected location of the stomach with tip in the region of the gastric antrum. Interstitial edema is noted with central vascular congestion. Mild prominence of the right hilum is again noted which may reflect central vascular congestion and/or adenopathy. Superimposed pneumonia is not entirely excluded. Resolution of free intraperitoneal air beneath the right hemidiaphragm on current study. No acute nor suspicious osseous abnormality. IMPRESSION: 1. Satisfactory support line and tube positions. 2. Bilateral diffuse interstitial edema is again noted with slight improvement in aeration at the right lung base and resolution of pneumoperitoneum. 3. Right hilar prominence is seen similar to prior and may reflect adenopathy, vascular summation or possibly superimposed pneumonia. Electronically Signed   By: Ashley Royalty M.D.   On: 05/14/2017 03:58   Dg Chest Port 1 View  Result Date: 05/09/2017 CLINICAL DATA:  56 year old male with history of shortness of breath. History of prior inguinal hernia repair. EXAM: PORTABLE CHEST 1 VIEW COMPARISON:  Chest x-ray 05/07/2017. FINDINGS: Elevated right hemidiaphragm with evidence of pneumoperitoneum beneath the right hemidiaphragm. Opacities at the right lung base likely reflect subsegmental atelectasis, although  underlying airspace consolidation is not excluded. Patchy areas of interstitial prominence are noted in the lungs bilaterally, most significant in the right mid to upper lung, concerning for potential developing bronchopneumonia. Small right pleural effusion. No left pleural effusion. No evidence of pulmonary edema. Heart size is normal. Upper mediastinal contours are within normal limits. Aortic atherosclerosis. Orthopedic fixation hardware in the lower cervical spine. IMPRESSION: 1. Findings are concerning for developing multilobar bronchopneumonia. 2. Worsening atelectasis and/or consolidation in the right lower lobe. Small right pleural effusion. 3. Persistent pneumoperitoneum beneath the right hemidiaphragm, presumably iatrogenic related to recent surgery. Electronically Signed   By: Vinnie Langton M.D.   On: 05/09/2017 23:48   Dg Chest Port 1 View  Result Date: 05/07/2017 CLINICAL DATA:  Rapid respiration. Abdominal distension and shortness of breath. Laparoscopic adrenalectomy and small bowel resection with reanastomosis May 06, 2017. EXAM: PORTABLE CHEST 1 VIEW COMPARISON:  Mar 05, 2015 FINDINGS: Free air  seen under the right hemidiaphragm consistent with surgery yesterday. No pneumothorax. The cardiomediastinal silhouette is normal. Bibasilar opacities are likely atelectasis. IMPRESSION: 1. Free air under the right hemidiaphragm consistent with surgery yesterday. 2. Bibasilar opacities, likely atelectasis. Electronically Signed   By: Dorise Bullion III M.D   On: 05/07/2017 21:04   Dg Abd Acute W/chest  Result Date: 05/11/2017 CLINICAL DATA:  Shortness of breath and abdominal pain EXAM: DG ABDOMEN ACUTE W/ 1V CHEST COMPARISON:  Chest x-ray from 2 days pper FINDINGS: Progressive diffuse airspace opacity. Lung volumes are low. Prominent heart size, accentuated by technique. No pneumothorax. Recent abdominal surgery with similar appearing pneumoperitoneum. Diffuse colonic, gastric, and small bowel  dilatation consistent with an ileus in this setting. Surgical clips and bowel sutures. IMPRESSION: 1. Progressive bilateral airspace disease which could be edema (likely noncardiogenic), infection, or aspiration. 2. Ileus pattern. Electronically Signed   By: Monte Fantasia M.D.   On: 05/11/2017 08:41    Labs:  CBC:  Recent Labs  05/26/17 0414 05/27/17 0441 05/29/17 0316 05/30/17 0430  WBC 15.8* 14.2* 16.3* 16.4*  HGB 7.6* 7.1* 7.2* 7.8*  HCT 23.6* 21.6* 21.6* 23.5*  PLT 894* 847* 852* 941*    COAGS:  Recent Labs  05/04/17 1018  INR 1.00  APTT 36    BMP:  Recent Labs  05/27/17 0441 05/28/17 0211 05/29/17 0316 05/30/17 0430  NA 133* 135 134* 132*  K 4.0 4.2 3.8 3.7  CL 96* 96* 98* 95*  CO2 31 32 28 25  GLUCOSE 130* 115* 161* 113*  BUN 23* 21* 18 17  CALCIUM 8.3* 8.3* 8.3* 8.6*  CREATININE 0.69 0.69 0.54* 0.57*  GFRNONAA >60 >60 >60 >60  GFRAA >60 >60 >60 >60    LIVER FUNCTION TESTS:  Recent Labs  05/23/17 0345 05/25/17 0509 05/26/17 0414 05/28/17 0211  BILITOT 0.4 0.3 0.3 0.5  AST 25 20 24  38  ALT 13* 12* 12* 18  ALKPHOS 67 80 97 215*  PROT 5.3* 6.1* 6.6 6.7  ALBUMIN 1.5* 1.6* 1.7* 1.7*    TUMOR MARKERS: No results for input(s): AFPTM, CEA, CA199, CHROMGRNA in the last 8760 hours.  Assessment and Plan:  S/P adrenalectomy with complicated course, now with RUQ abscess.  Images reviewed by Dr. Anselm Pancoast and Dr. Laurence Ferrari.  Will proceed with image guided aspiration of the fluid collection.  Risks and benefits of the aspiration were discussed with the patient including bleeding, infection, damage to adjacent structures, bowel perforation/fistula connection, and sepsis.  All of the patient's questions were answered, patient is agreeable to proceed. Consent signed and in chart.  Thank you for this interesting consult.  I greatly enjoyed meeting [name] [name] and look forward to participating in their care.  A copy of this report was sent to the  requesting provider on this date.  Electronically Signed: Murrell Redden, PA-C 05/30/2017, 9:15 AM   I spent a total of 40 Minutes in face to face in clinical consultation, greater than 50% of which was counseling/coordinating care for RUQ fluid collection.

## 2017-05-30 NOTE — Progress Notes (Signed)
Patient's wife came to RN stating that "blood was backing up into the PICC."  RN went to assess; RN saw that patient's TPN line had become detached from filter and was on the floor.  RN unhooked the rest of the line from the PICC and flushed.  IV team was paged; IV RN stated to run D10 at same rate that the TPN was running.  D10 infusion began at set rate of 120 mL/hr.  Will continue to monitor.

## 2017-05-31 DIAGNOSIS — I10 Essential (primary) hypertension: Secondary | ICD-10-CM

## 2017-05-31 LAB — PHOSPHORUS: Phosphorus: 4.6 mg/dL (ref 2.5–4.6)

## 2017-05-31 LAB — BASIC METABOLIC PANEL
Anion gap: 9 (ref 5–15)
BUN: 20 mg/dL (ref 6–20)
CO2: 26 mmol/L (ref 22–32)
CREATININE: 0.67 mg/dL (ref 0.61–1.24)
Calcium: 8.4 mg/dL — ABNORMAL LOW (ref 8.9–10.3)
Chloride: 98 mmol/L — ABNORMAL LOW (ref 101–111)
GFR calc Af Amer: >60 mL/min (ref >60)
Glucose, Bld: 119 mg/dL — ABNORMAL HIGH (ref 65–99)
POTASSIUM: 4.1 mmol/L (ref 3.5–5.1)
SODIUM: 133 mmol/L — AB (ref 135–145)

## 2017-05-31 LAB — CBC WITH DIFFERENTIAL/PLATELET
BASOS ABS: 0.1 10*3/uL (ref 0.0–0.1)
BASOS PCT: 0 %
EOS PCT: 5 %
Eosinophils Absolute: 0.7 10*3/uL (ref 0.0–0.7)
HEMATOCRIT: 24.1 % — AB (ref 39.0–52.0)
Hemoglobin: 8.1 g/dL — ABNORMAL LOW (ref 13.0–17.0)
Lymphocytes Relative: 20 %
Lymphs Abs: 3 10*3/uL (ref 0.7–4.0)
MCH: 26.3 pg (ref 26.0–34.0)
MCHC: 33.6 g/dL (ref 30.0–36.0)
MCV: 78.2 fL (ref 78.0–100.0)
MONO ABS: 1.2 10*3/uL — AB (ref 0.1–1.0)
MONOS PCT: 8 %
Neutro Abs: 9.7 10*3/uL — ABNORMAL HIGH (ref 1.7–7.7)
Neutrophils Relative %: 67 %
PLATELETS: 890 10*3/uL — AB (ref 150–400)
RBC: 3.08 MIL/uL — ABNORMAL LOW (ref 4.22–5.81)
RDW: 15.9 % — AB (ref 11.5–15.5)
WBC: 14.5 10*3/uL — ABNORMAL HIGH (ref 4.0–10.5)

## 2017-05-31 LAB — GLUCOSE, CAPILLARY
GLUCOSE-CAPILLARY: 105 mg/dL — AB (ref 65–99)
GLUCOSE-CAPILLARY: 121 mg/dL — AB (ref 65–99)
GLUCOSE-CAPILLARY: 124 mg/dL — AB (ref 65–99)
Glucose-Capillary: 111 mg/dL — ABNORMAL HIGH (ref 65–99)

## 2017-05-31 LAB — LACTIC ACID, PLASMA: Lactic Acid, Venous: 1.2 mmol/L (ref 0.5–1.9)

## 2017-05-31 LAB — MAGNESIUM: MAGNESIUM: 1.9 mg/dL (ref 1.7–2.4)

## 2017-05-31 MED ORDER — ALTEPLASE 2 MG IJ SOLR
2.0000 mg | Freq: Once | INTRAMUSCULAR | Status: AC
  Administered 2017-05-31: 2 mg
  Filled 2017-05-31: qty 2

## 2017-05-31 MED ORDER — IPRATROPIUM-ALBUTEROL 0.5-2.5 (3) MG/3ML IN SOLN
3.0000 mL | Freq: Two times a day (BID) | RESPIRATORY_TRACT | Status: DC
  Administered 2017-05-31 – 2017-07-02 (×63): 3 mL via RESPIRATORY_TRACT
  Filled 2017-05-31 (×64): qty 3

## 2017-05-31 MED ORDER — SODIUM CHLORIDE 0.9% FLUSH: 9.0000 mL | INTRAVENOUS | Status: DC | PRN

## 2017-05-31 MED ORDER — CHLORHEXIDINE GLUCONATE 0.12 % MT SOLN
15.0000 mL | Freq: Two times a day (BID) | OROMUCOSAL | Status: DC
  Administered 2017-05-31 – 2017-06-27 (×50): 15 mL via OROMUCOSAL
  Filled 2017-05-31 (×39): qty 15

## 2017-05-31 MED ORDER — TRACE MINERALS CR-CU-MN-SE-ZN 10-1000-500-60 MCG/ML IV SOLN
INTRAVENOUS | Status: AC
  Administered 2017-05-31: 18:00:00 via INTRAVENOUS
  Filled 2017-05-31 (×2): qty 2880

## 2017-05-31 MED ORDER — HYDROMORPHONE 1 MG/ML IV SOLN
INTRAVENOUS | Status: DC
  Administered 2017-05-31 (×2): via INTRAVENOUS
  Administered 2017-05-31: 6.98 mg via INTRAVENOUS
  Administered 2017-05-31: 17.38 mg via INTRAVENOUS
  Administered 2017-05-31: 1 mg via INTRAVENOUS
  Administered 2017-06-01: 4.95 mg via INTRAVENOUS
  Administered 2017-06-01: 7.99 mg via INTRAVENOUS
  Administered 2017-06-01: 10.07 mg via INTRAVENOUS
  Administered 2017-06-01: 6.75 mg via INTRAVENOUS
  Administered 2017-06-01: 2.7 mg via INTRAVENOUS
  Administered 2017-06-01: 9.2 mg via INTRAVENOUS
  Administered 2017-06-02: 6.63 mg via INTRAVENOUS
  Administered 2017-06-02: 8.72 mg via INTRAVENOUS
  Administered 2017-06-02: 5.81 mg via INTRAVENOUS
  Administered 2017-06-02 (×2): via INTRAVENOUS
  Administered 2017-06-02: 6.7 mg via INTRAVENOUS
  Administered 2017-06-02: 6.48 mg via INTRAVENOUS
  Administered 2017-06-02: 8.25 mg via INTRAVENOUS
  Administered 2017-06-03: 1 mg via INTRAVENOUS
  Administered 2017-06-03: 2.69 mg via INTRAVENOUS
  Administered 2017-06-03: 1 mg via INTRAVENOUS
  Administered 2017-06-03: 5.91 mg via INTRAVENOUS
  Administered 2017-06-03: 6.38 mg via INTRAVENOUS
  Administered 2017-06-03: 6.52 mg via INTRAVENOUS
  Administered 2017-06-03: 23:00:00 via INTRAVENOUS
  Administered 2017-06-03: 4.56 mg via INTRAVENOUS
  Administered 2017-06-03: 1 mg via INTRAVENOUS
  Administered 2017-06-04: 11.4 mg via INTRAVENOUS
  Administered 2017-06-04: 5.38 mg via INTRAVENOUS
  Administered 2017-06-04 (×2): 1 mg via INTRAVENOUS
  Administered 2017-06-04: 16:00:00 via INTRAVENOUS
  Administered 2017-06-04: 7.64 mg via INTRAVENOUS
  Administered 2017-06-05: 06:00:00 via INTRAVENOUS
  Administered 2017-06-05: 7.6 mg via INTRAVENOUS
  Administered 2017-06-05: 4.87 mg via INTRAVENOUS
  Administered 2017-06-05: 9.94 mg via INTRAVENOUS
  Administered 2017-06-05: 1 mg via INTRAVENOUS
  Administered 2017-06-05: 4.91 mg via INTRAVENOUS
  Administered 2017-06-06: 23:00:00 via INTRAVENOUS
  Administered 2017-06-06: 8.49 mg via INTRAVENOUS
  Administered 2017-06-06: 6.47 mg via INTRAVENOUS
  Administered 2017-06-06: 25 mg via INTRAVENOUS
  Administered 2017-06-06: 1 mg via INTRAVENOUS
  Administered 2017-06-06: 5.7 mg via INTRAVENOUS
  Administered 2017-06-07: 3.53 mg via INTRAVENOUS
  Administered 2017-06-07: 13.94 mg via INTRAVENOUS
  Administered 2017-06-07: 14:00:00 via INTRAVENOUS
  Administered 2017-06-07: 12.89 mg via INTRAVENOUS
  Administered 2017-06-08: 6.56 mg via INTRAVENOUS
  Administered 2017-06-08: 4.37 mg via INTRAVENOUS
  Administered 2017-06-08: 6.83 mg via INTRAVENOUS
  Administered 2017-06-08: 09:00:00 via INTRAVENOUS
  Administered 2017-06-08: 14.08 mg via INTRAVENOUS
  Administered 2017-06-08: 3.15 mg via INTRAVENOUS
  Administered 2017-06-09: 8.74 mg via INTRAVENOUS
  Administered 2017-06-09: 6.21 mg via INTRAVENOUS
  Administered 2017-06-09: 17:00:00 via INTRAVENOUS
  Administered 2017-06-09: 5.18 mg via INTRAVENOUS
  Administered 2017-06-09: 01:00:00 via INTRAVENOUS
  Administered 2017-06-09: 2.59 mg via INTRAVENOUS
  Administered 2017-06-09: 4.86 mg via INTRAVENOUS
  Administered 2017-06-09: 4.25 mg via INTRAVENOUS
  Administered 2017-06-10: 5.08 mg via INTRAVENOUS
  Administered 2017-06-10: 4.99 mg via INTRAVENOUS
  Administered 2017-06-10: 4.63 mg via INTRAVENOUS
  Administered 2017-06-10: 5.06 mg via INTRAVENOUS
  Administered 2017-06-10: 12:00:00 via INTRAVENOUS
  Administered 2017-06-11: 7.14 mg via INTRAVENOUS
  Administered 2017-06-11: 13.62 mg via INTRAVENOUS
  Administered 2017-06-11: 6.03 mg via INTRAVENOUS
  Administered 2017-06-11: 22:00:00 via INTRAVENOUS
  Administered 2017-06-11: 5.19 mg via INTRAVENOUS
  Administered 2017-06-11: 07:00:00 via INTRAVENOUS
  Administered 2017-06-11: 3.94 mg via INTRAVENOUS
  Administered 2017-06-12: 5.47 mg via INTRAVENOUS
  Administered 2017-06-12: 8.1 mg via INTRAVENOUS
  Administered 2017-06-12: 10.98 mg via INTRAVENOUS
  Administered 2017-06-12: 13:00:00 via INTRAVENOUS
  Administered 2017-06-12: 2.57 mg via INTRAVENOUS
  Administered 2017-06-12: 4.36 mg via INTRAVENOUS
  Administered 2017-06-12: 5.68 mg via INTRAVENOUS
  Administered 2017-06-13: 6.19 mg via INTRAVENOUS
  Administered 2017-06-13: 21:00:00 via INTRAVENOUS
  Administered 2017-06-13: 5.45 mg via INTRAVENOUS
  Administered 2017-06-13: 6.29 mg via INTRAVENOUS
  Administered 2017-06-13: 6.69 mg via INTRAVENOUS
  Administered 2017-06-13: 10.07 mg via INTRAVENOUS
  Administered 2017-06-13: 05:00:00 via INTRAVENOUS
  Administered 2017-06-13: 2.3 mg via INTRAVENOUS
  Administered 2017-06-14: 6.06 mg via INTRAVENOUS
  Administered 2017-06-14: 4.74 mg via INTRAVENOUS
  Administered 2017-06-14: 16:00:00 via INTRAVENOUS
  Administered 2017-06-14: 3.9 mg via INTRAVENOUS
  Administered 2017-06-14: 4.92 mg via INTRAVENOUS
  Administered 2017-06-15: 10:00:00 via INTRAVENOUS
  Administered 2017-06-15: 4.03 mg via INTRAVENOUS
  Administered 2017-06-15: 10.97 mg via INTRAVENOUS
  Administered 2017-06-16: 6.49 mg via INTRAVENOUS
  Administered 2017-06-16: 3.67 mg via INTRAVENOUS
  Administered 2017-06-16: 4.35 mg via INTRAVENOUS
  Administered 2017-06-16: 6.67 mg via INTRAVENOUS
  Administered 2017-06-17: 3.84 mg via INTRAVENOUS
  Administered 2017-06-17: 20:00:00 via INTRAVENOUS
  Administered 2017-06-17: 11.52 mg via INTRAVENOUS
  Administered 2017-06-17: 5.17 mg via INTRAVENOUS
  Administered 2017-06-17: 1 mg via INTRAVENOUS
  Administered 2017-06-17: 4.85 mg via INTRAVENOUS
  Administered 2017-06-18: 8.95 mg via INTRAVENOUS
  Administered 2017-06-18: 4.29 mg via INTRAVENOUS
  Administered 2017-06-18: 11:00:00 via INTRAVENOUS
  Administered 2017-06-18: 8.31 mg via INTRAVENOUS
  Administered 2017-06-19: 7.12 mg via INTRAVENOUS
  Administered 2017-06-19: 4.97 mg via INTRAVENOUS
  Administered 2017-06-19: 4.12 mg via INTRAVENOUS
  Administered 2017-06-19 (×2): via INTRAVENOUS
  Administered 2017-06-20: 5.49 mg via INTRAVENOUS
  Administered 2017-06-20: 5.11 mg via INTRAVENOUS
  Administered 2017-06-20: 18:00:00 via INTRAVENOUS
  Administered 2017-06-20: 5.48 mg via INTRAVENOUS
  Administered 2017-06-21: 7.97 mg via INTRAVENOUS
  Administered 2017-06-21: 12:00:00 via INTRAVENOUS
  Administered 2017-06-21: 4.49 mg via INTRAVENOUS
  Administered 2017-06-21: 4.99 mg via INTRAVENOUS
  Administered 2017-06-21: 5.39 mg via INTRAVENOUS
  Administered 2017-06-22: 3.31 mg via INTRAVENOUS
  Administered 2017-06-22 (×2): 1 mg via INTRAVENOUS
  Administered 2017-06-22: 13.94 mg via INTRAVENOUS
  Administered 2017-06-22: 4.5 mg via INTRAVENOUS
  Administered 2017-06-22: 5.83 mg via INTRAVENOUS
  Administered 2017-06-23: 5.49 mg via INTRAVENOUS
  Administered 2017-06-23: 5.48 mg via INTRAVENOUS
  Administered 2017-06-23: 5 mg via INTRAVENOUS
  Administered 2017-06-23: 12:00:00 via INTRAVENOUS
  Administered 2017-06-24: 5.49 mg via INTRAVENOUS
  Administered 2017-06-24: 5.3 mg via INTRAVENOUS
  Administered 2017-06-24: 08:00:00 via INTRAVENOUS
  Administered 2017-06-24: 14.44 mg via INTRAVENOUS
  Administered 2017-06-24: 23:00:00 via INTRAVENOUS
  Administered 2017-06-24: 4.99 mg via INTRAVENOUS
  Administered 2017-06-25: 4.36 mg via INTRAVENOUS
  Administered 2017-06-25: 11.39 mg via INTRAVENOUS
  Administered 2017-06-25: 16:00:00 via INTRAVENOUS
  Administered 2017-06-25: 4.26 mg via INTRAVENOUS
  Administered 2017-06-25: 5.31 mg via INTRAVENOUS
  Administered 2017-06-25: 6.78 mg via INTRAVENOUS
  Administered 2017-06-26: 5.58 mg via INTRAVENOUS
  Administered 2017-06-26: 09:00:00 via INTRAVENOUS
  Administered 2017-06-26: 3.67 mg via INTRAVENOUS
  Administered 2017-06-26: 6.49 mg via INTRAVENOUS
  Administered 2017-06-26: 4.49 mg via INTRAVENOUS
  Administered 2017-06-27: 3.98 mg via INTRAVENOUS
  Administered 2017-06-27: 5.81 mg via INTRAVENOUS
  Administered 2017-06-27: 3.48 mg via INTRAVENOUS
  Administered 2017-06-27: 09:00:00 via INTRAVENOUS
  Administered 2017-06-27: 3.95 mg via INTRAVENOUS
  Filled 2017-05-31 (×41): qty 25

## 2017-05-31 MED ORDER — NALOXONE HCL 0.4 MG/ML IJ SOLN
0.4000 mg | INTRAMUSCULAR | Status: DC | PRN
  Filled 2017-05-31: qty 1

## 2017-05-31 MED ORDER — PANTOPRAZOLE SODIUM 40 MG IV SOLR
40.0000 mg | Freq: Two times a day (BID) | INTRAVENOUS | Status: DC
  Administered 2017-05-31 – 2017-06-27 (×55): 40 mg via INTRAVENOUS
  Filled 2017-05-31 (×56): qty 40

## 2017-05-31 MED ORDER — FAT EMULSION 20 % IV EMUL
240.0000 mL | INTRAVENOUS | Status: AC
Start: 2017-05-31 — End: 2017-06-01
  Administered 2017-05-31: 240 mL via INTRAVENOUS
  Filled 2017-05-31: qty 250

## 2017-05-31 MED ORDER — ORAL CARE MOUTH RINSE
15.0000 mL | Freq: Two times a day (BID) | OROMUCOSAL | Status: DC
  Administered 2017-06-01 – 2017-07-29 (×80): 15 mL via OROMUCOSAL

## 2017-05-31 MED ORDER — MAGNESIUM SULFATE 2 GM/50ML IV SOLN
2.0000 g | Freq: Once | INTRAVENOUS | Status: AC
  Administered 2017-05-31: 2 g via INTRAVENOUS
  Filled 2017-05-31: qty 50

## 2017-05-31 NOTE — Progress Notes (Signed)
[facility] Progress Note Patient Name: [name] [name] DOB: 1961-05-25 MRN: [id number]   Date of Service  05/31/2017  HPI/Events of Note  Patient doing well on Dilaudid PCA. Request to transfer to Stepdown bed status based on discussion with Dr. Chase Caller on rounds.   eICU Interventions  Will transfer to Stepdown bed status.      Intervention Category Minor Interventions: Routine modifications to care plan (e.g. PRN medications for pain, fever)  Ellsie Violette Eugene 05/31/2017, 3:42 PM

## 2017-05-31 NOTE — Progress Notes (Signed)
[facility] NOTE   Pharmacy Consult for TPN Indication: prolonged ileus, small bowel leak, multiple bowel surgeries  Patient Measurements: Body mass index is 29.51 kg/m. Filed Weights   05/21/17 0555 05/21/17 1111 05/26/17 0430  Weight: 208 lb 8.9 oz (94.6 kg) 208 lb 8.9 oz (94.6 kg) 205 lb 11 oz (93.3 kg)   HPI: 26 yoM admitted on 7/11 for enlarging adrenal mass and planned adrenalectomy.  Pharmacy is now consulted to dose TPN.  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  Planning for return to OR in 48-72h 7/22 OR:  wash out, tube ileostomy, left with open abdomen & wound vac in place.  Plan for OR on 7/25 for closure 7/25 OR: mesh placed for abdominal closure, wound vac 8/2 extubated, propofol drip off 8/4 TPN off for ~ 5 hours due to line detached from filter  Insulin requirements past 24 hours: 0 units SSI (no hx DM)  Current Nutrition: NPO  IVF: D5 1/2NS at10 ml/hr  Central access: PICC line ordered 7/19, placed on 7/20 TPN start date: 7/20  ASSESSMENT                                                                                                           Today, 05/31/17 - Glucose: at goal (goal < 150) - Electrolytes:   Na low  K WNL after replacement  Corrected calcium WNL  Mg improved  Phos  WNL - Renal:  SCr wnl, stable. . 8/2 I/O: 6393/3345 net +3063mL - Weight: most recent 7/31 93.3kg down from 94.6kg on 7/26 - LFTs: WNL, Tbili WNL - TGs:  296 (7/18), 266 (7/20), 269 (7/23), 210 (7/28), 285 (7/31) 301 (8/3)   - Prealbumin:  <5 (7/20), 6.1 (7/24), 10.1  (7/30)  NUTRITIONAL GOALS                                                                                             RD recs (7/27):  160 Protein (1.8 g/kg of usual body weight 89.4kg), 2300-2530 Kcal  Clinimix 5/15 at a max fluid volume of 3L at 120 ml/hr  + 20% fat emulsion 240 ml/day to provide: 144 g of protein and 2568 kCals per day meeting 94% of protein and >100% of kCal needs. - Glucose infusion rate will be 3.2 mg/kg/min (Maximum 5 mg/kg/min) - Using Lipids only  3 times/week on MWF would provide: 150 g of protein and an average of 2336 kCals per day meeting 94% of protein and 100 % of kCal needs.  When using the 5/20 formulation (which is the only available electrolyte-free formulation available), a  rate of 115 ml/hr with lipids 3x/week on MWF would provide: 138g protein and avg 2174 kcal/day   PLAN                                                                                                                          At 1800 today:  Continue Clinimix to E 5/15 at 11ml/hr  continue lipids today at 70mls/hr x 12 hours (propofol has been d/c'd and pt has been in ICU on TPN >7days)  TPN to contain standard multivitamins and trace elements daily  Continue IVF at [place] and moderate SSI q6h.  TPN lab panels on Mondays & Thursdays.  Monitor I/O with high volume TPN   Dolly Rias RPh 05/31/2017, 8:22 AM Pager [phone]

## 2017-05-31 NOTE — Progress Notes (Signed)
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  Patient's course was complicated by small bowel injury requiring resection with anastomoses and placement of wound VAC. On 7/18, patient developed leak and was taken back to the operating room for redo of his exploratory laparotomy and repair of leak. That time, patient returned to the ICU on the ventilator. Critical care was consulted for ventilator/medical management.  Over the next 2 weeks, patient's hospital course was complicated with the following: 07/19 - NGT pulled out and almost self-extubated. Developed cuff leak >> tube exchanged. Back to OR emergently for intestinal leak/ischemic bowel.TPN started at that time 07/22 - Back to OR for wash out >> left with open abdomen & wound vac in place. Transfused 1u PRBC. 07/25 - rising temp, WBC increased. Returned to OR for closure 07/27 - Recultured, sedation down on precedex [facility] 7/27 >> MRSE 1/2 > contaminant ? 07/30 - Remains on sedation, VAC leak over the weekend with dressing change by [facility] 07/31 - Initial attempt at weaning failed to due to tachypnea/anxiety 08/02 - Fever 101.1, WBC 14.2, weaned / extubated 8/3 -   Extubated yesterday, tolerating well.  O2 weaned to 1L.  Remains on dilaudid at 5mg /hr.  RN reports precedex started overnight for anxiety. 8/4: Ultrasound-guided aspiration By interventional radiology of small perihepatic fluid collection yielded only 1 mL of the fluid, sent for culture.  As of 8/5, patient improving. Off Precedex drip since 1908/4. Dilaudid Drip being changed over to PCA.  Still with some coffee ground -looking material in NG tube.  White blood cell count staying elevated at 16 for past few days. Hemoglobin Stable for the past week between 7-8. Still on TPN  Patient today feels very anxious, some pain. No shortness of breath, although does feel like he cannot take a deep breath.    Assessment/Plan: Principal Problem:   Right adrenal mass s/p adrenalectomy 05/06/2017 Active Problems:   GERD   Tobacco abuse   COPD (chronic obstructive pulmonary disease) ([place])   Essential hypertension:In setting of pheochromocytoma, status post resection. I & D Antihypertensives when necessary Reported history of hyperthyroidism. TSH stable  Acute blood loss anemia: 13.9 prior to admission. Most immediate blood loss was from initial surgeries. Monitoring for GI blood losses now. Some coffee ground-looking material in NG tube    Chronic narcotic use   Anxiety state: On when necessary Ativan. Off of Precedex   Hypokalemia: Replacing as needed    Severe sepsis (HCC)From peritonitis secondary to anastomotic leak: Completed initial antibiotic course. Also with MSS[facility] healthcare associated pneumonia. Noted increasing white blood cell count. Currently on Zosyn and Flagyl. No fever.  Severe protein calorie malnutrition:Being followed by nutrition. Currently on TPN. Albumin 1.6  Acute respiratory failure with hypoxia: In the setting of sepsis/Healthcare associated pneumonia. Off ventilator.   Wound dehiscence, surgical    Pressure injury of skin Intestinal perforation in the setting of adhesions during adrenalectomy status post re-anastomoses: On NG tube, TPN. Following H&H.  Code Status: Full code   Family Communication: Left message for wife   Disposition Plan: Continue inpatient. Transferred to step down. Hospitalists to assume medical  management consultation    Consultants:  Critical care  Interventional radiology   Procedures:  7/11: Open right adrenalectomy  complicated with small bowel injury requiring resection, reanastomosis and placement of wound VAC.  7/18: Found to have leak and taken back to or for redo exploratory laparoscopy.  7/18: Intubated  7/19: Back to work emergently for intestinal leak/ischemic bowel  7/20: PICC line placed  7/22: Back to or for washout-left with open abdomen and wound VAC.  7/22 transfuse 1 unit packed red blood cells  7/25: Venous duplex notes superficial vein thrombosis in cephalic vein  8/2: Extubated  8/3: Precedex drip started for anxiety  8/4:Ultrasound-guided aspiration of 1 mL. Hepatic fluid by interventional radiology.  Antimicrobials:  PCN ALLERGIC Aztreonam 7/17 (x1 dose) Vancomycin 7/17 - 7/21 Cefepime 7/17 >>> 7/31  , Restarted 8/3-present Eraxis 7/19 >>> 7/21, 7/26 >> 8/1 Flagyl 7/20 >>> 7/30, Restarted 8/3-present Vancomycin 7/30 >> 7/31 Ancef 7/31-8/3   DVT prophylaxis: SCDs   Objective: Vitals:   05/31/17 0600 05/31/17 0757 05/31/17 0800 05/31/17 1136  BP: 122/84     Pulse: 96     Resp: (!) 23   (!) 21  Temp:   97.8 F (36.6 C)   TempSrc:   Oral   SpO2: 100% 95%    Weight:      Height:        Intake/Output Summary (Last 24 hours) at 05/31/17 1210 Last data filed at 05/31/17 1245  Gross per 24 hour  Intake          5539.91 ml  Output             2470 ml  Net          3069.91 ml   Filed Weights   05/21/17 0555 05/21/17 1111 05/26/17 0430  Weight: 94.6 kg (208 lb 8.9 oz) 94.6 kg (208 lb 8.9 oz) 93.3 kg (205 lb 11 oz)    Exam:   General:  Alert and oriented 3, fatigued, mildly anxious  HEENT: Normocephalic, NG tube in place, mucous membranes slightly dry   Cardiovascular: Regular rate and rhythm, S1-S2   Respiratory: Few rails, only moderate inspiratory effort, breathing not labored   Abdomen: Soft, nonspecific tenderness, noted drains in place, no bowel sounds   Musculoskeletal: No clubbing or cyanosis, 1+ pitting edema from the knees down    Skin: No skin breaks, tears or lesions-mepilex Dressing over area on mid back.  Psychiatry: Appropriate, slightly anxious  Neuro: No deficits    Data Reviewed: CBC:  Recent Labs Lab 05/25/17 0509 05/26/17 0414 05/27/17 0441 05/29/17 0316 05/30/17 0430  WBC 14.5* 15.8* 14.2* 16.3* 16.4*  NEUTROABS 10.6*  --   --   --   --   HGB 7.4* 7.6* 7.1* 7.2* 7.8*  HCT 22.5* 23.6* 21.6* 21.6* 23.5*  MCV 82.4 83.4 82.4 81.2 80.5  PLT 857* 894* 847* 852* 809*   Basic Metabolic Panel:  Recent Labs Lab 05/27/17 0441 05/28/17 0211 05/29/17 0316 05/30/17 0430 05/31/17 0315  NA 133* 135 134* 132* 133*  K 4.0 4.2 3.8 3.7 4.1  CL 96* 96* 98* 95* 98*  CO2 31 32 28 25 26   GLUCOSE 130* 115* 161* 113* 119*  BUN 23* 21* 18 17 20   CREATININE 0.69 0.69 0.54* 0.57* 0.67  CALCIUM 8.3* 8.3* 8.3* 8.6* 8.4*  MG 2.0 2.0 1.8 1.7 1.9  PHOS 4.3 5.1* 3.1 3.8 4.6   GFR: Estimated Creatinine Clearance: 118.3 mL/min (by C-G formula based on SCr  of 0.67 mg/dL). Liver Function Tests:  Recent Labs Lab 05/25/17 0509 05/26/17 0414 05/28/17 0211  AST 20 24 38  ALT 12* 12* 18  ALKPHOS 80 97 215*  BILITOT 0.3 0.3 0.5  PROT 6.1* 6.6 6.7  ALBUMIN 1.6* 1.7* 1.7*   No results for input(s): LIPASE, AMYLASE in the last 168 hours. No results for input(s): AMMONIA in the last 168 hours. Coagulation Profile:  Recent Labs Lab 05/30/17 0900  INR 1.21   Cardiac Enzymes: No results for input(s): CKTOTAL, CKMB, CKMBINDEX, TROPONINI in the last 168 hours. BNP (last 3 results) No results for input(s): PROBNP in the last 8760 hours. HbA1C: No results for input(s): HGBA1C in the last 72 hours. CBG:  Recent Labs Lab 05/30/17 0550 05/30/17 1254 05/30/17 1818 05/30/17 2349 05/31/17 0617  GLUCAP 120* 120* 111* 114* 111*   Lipid Profile:  Recent Labs  05/29/17 0316  TRIG 301*   Thyroid Function Tests: No results for input(s): TSH, T4TOTAL, FREET4, T3FREE, THYROIDAB in the last 72  hours. Anemia Panel: No results for input(s): VITAMINB12, FOLATE, FERRITIN, TIBC, IRON, RETICCTPCT in the last 72 hours. Urine analysis:    Component Value Date/Time   COLORURINE AMBER (A) 05/22/2017 1300   APPEARANCEUR CLEAR 05/22/2017 1300   LABSPEC 1.021 05/22/2017 1300   PHURINE 6.0 05/22/2017 1300   GLUCOSEU NEGATIVE 05/22/2017 1300   HGBUR SMALL (A) 05/22/2017 1300   BILIRUBINUR NEGATIVE 05/22/2017 1300   KETONESUR NEGATIVE 05/22/2017 1300   PROTEINUR NEGATIVE 05/22/2017 1300   UROBILINOGEN 0.2 02/01/2014 0454   NITRITE NEGATIVE 05/22/2017 1300   LEUKOCYTESUR SMALL (A) 05/22/2017 1300   Sepsis Labs: @LABRCNTIP (procalcitonin:4,lacticidven:4)  ) Recent Results (from the past 240 hour(s))  Culture, blood (Routine X 2) w Reflex to ID Panel     Status: Abnormal   Collection Time: 05/22/17 10:41 AM  Result Value Ref Range Status   Specimen Description BLOOD LEFT ARM  Final   Special Requests IN PEDIATRIC BOTTLE Blood Culture adequate volume  Final   Culture  Setup Time   Final    GRAM POSITIVE COCCI IN CLUSTERS IN PEDIATRIC BOTTLE CRITICAL RESULT CALLED TO, READ BACK BY AND VERIFIED WITH: M LILLISTON,PHARMD AT 1453 05/23/17 BY L BENFIELD    Culture (A)  Final    STAPHYLOCOCCUS SPECIES (COAGULASE NEGATIVE) THE SIGNIFICANCE OF ISOLATING THIS ORGANISM FROM A SINGLE SET OF BLOOD CULTURES WHEN MULTIPLE SETS ARE DRAWN IS UNCERTAIN. PLEASE NOTIFY THE MICROBIOLOGY DEPARTMENT WITHIN ONE WEEK IF SPECIATION AND SENSITIVITIES ARE REQUIRED. Performed at [facility], [place] [address], [place], [place] [address]    Report Status 05/25/2017 FINAL  Final  Culture, blood (Routine X 2) w Reflex to ID Panel     Status: None   Collection Time: 05/22/17 10:41 AM  Result Value Ref Range Status   Specimen Description BLOOD LEFT HAND  Final   Special Requests AEROBIC BOTTLE ONLY Blood Culture adequate volume  Final   Culture   Final    NO GROWTH 5 DAYS Performed at [facility],  [place] [address], [place],  [address]    Report Status 05/27/2017 FINAL  Final  Blood Culture ID Panel (Reflexed)     Status: Abnormal   Collection Time: 05/22/17 10:41 AM  Result Value Ref Range Status   Enterococcus species NOT DETECTED NOT DETECTED Final   Listeria monocytogenes NOT DETECTED NOT DETECTED Final   Staphylococcus species DETECTED (A) NOT DETECTED Final    Comment: Methicillin (oxacillin) resistant coagulase negative  staphylococcus. Possible blood culture contaminant (unless isolated from more than one blood culture draw or clinical case suggests pathogenicity). No antibiotic treatment is indicated for blood  culture contaminants. CRITICAL RESULT CALLED TO, READ BACK BY AND VERIFIED WITH: M LILLISTON,PHARMD AT 1452 05/23/17 BY L BENFIELD    Staphylococcus aureus NOT DETECTED NOT DETECTED Final   Methicillin resistance DETECTED (A) NOT DETECTED Final    Comment: CRITICAL RESULT CALLED TO, READ BACK BY AND VERIFIED WITH: M LILLISTON,PHARMD AT 1452 05/23/17 BY L BENFIELD    Streptococcus species NOT DETECTED NOT DETECTED Final   Streptococcus agalactiae NOT DETECTED NOT DETECTED Final   Streptococcus pneumoniae NOT DETECTED NOT DETECTED Final   Streptococcus pyogenes NOT DETECTED NOT DETECTED Final   Acinetobacter baumannii NOT DETECTED NOT DETECTED Final   Enterobacteriaceae species NOT DETECTED NOT DETECTED Final   Enterobacter cloacae complex NOT DETECTED NOT DETECTED Final   Escherichia coli NOT DETECTED NOT DETECTED Final   Klebsiella oxytoca NOT DETECTED NOT DETECTED Final   Klebsiella pneumoniae NOT DETECTED NOT DETECTED Final   Proteus species NOT DETECTED NOT DETECTED Final   Serratia marcescens NOT DETECTED NOT DETECTED Final   Haemophilus influenzae NOT DETECTED NOT DETECTED Final   Neisseria meningitidis NOT DETECTED NOT DETECTED Final   Pseudomonas aeruginosa NOT DETECTED NOT DETECTED Final   Candida albicans NOT DETECTED NOT DETECTED Final   Candida  glabrata NOT DETECTED NOT DETECTED Final   Candida krusei NOT DETECTED NOT DETECTED Final   Candida parapsilosis NOT DETECTED NOT DETECTED Final   Candida tropicalis NOT DETECTED NOT DETECTED Final    Comment: Performed at [facility], [place]. [address], [place], [place] [address]  Culture, respiratory (NON-Expectorated)     Status: None   Collection Time: 05/22/17 12:00 PM  Result Value Ref Range Status   Specimen Description BRONCHIAL ALVEOLAR LAVAGE LEFT  Final   Special Requests NONE  Final   Gram Stain   Final    RARE WBC PRESENT,BOTH PMN AND MONONUCLEAR NO ORGANISMS SEEN Performed at [facility], [place] [address], [place], [place] [address]    Culture RARE STAPHYLOCOCCUS AUREUS  Final   Report Status 05/25/2017 FINAL  Final   Organism ID, Bacteria STAPHYLOCOCCUS AUREUS  Final      Susceptibility   Staphylococcus aureus - MIC*    CIPROFLOXACIN <=0.5 SENSITIVE Sensitive     ERYTHROMYCIN <=0.25 SENSITIVE Sensitive     GENTAMICIN <=0.5 SENSITIVE Sensitive     OXACILLIN <=0.25 SENSITIVE Sensitive     TETRACYCLINE <=1 SENSITIVE Sensitive     VANCOMYCIN <=0.5 SENSITIVE Sensitive     TRIMETH/SULFA <=10 SENSITIVE Sensitive     CLINDAMYCIN <=0.25 SENSITIVE Sensitive     RIFAMPIN <=0.5 SENSITIVE Sensitive     Inducible Clindamycin NEGATIVE Sensitive     * RARE STAPHYLOCOCCUS AUREUS  Body fluid culture     Status: None (Preliminary result)   Collection Time: 05/30/17 12:16 PM  Result Value Ref Range Status   Specimen Description FLUID ASPIRATE  Final   Special Requests SMALL PERIHEPATIC FLUID  Final   Gram Stain   Final    FEW WBC PRESENT, PREDOMINANTLY PMN NO ORGANISMS SEEN    Culture   Final    NO GROWTH 1 DAY Performed at [facility], [place] [address] [place],  [address]    Report Status PENDING  Incomplete      Studies: US Aspiration  Result Date: 05/30/2017 INDICATION: 56 year old male status post complicated right adrenalectomy  with small bowel  injury, resection and anastomotic leak necessitating re- operative intervention. He currently has a thin rim of peripherally enhancing fluid in the perihepatic space and presents for ultrasound-guided aspiration. EXAM: Ultrasound-guided aspiration MEDICATIONS: The patient is currently admitted to the hospital and receiving intravenous antibiotics. The antibiotics were administered within an appropriate time frame prior to the initiation of the procedure. ANESTHESIA/SEDATION: None COMPLICATIONS: None immediate. PROCEDURE: Informed written consent was obtained from the patient after a thorough discussion of the procedural risks, benefits and alternatives. All questions were addressed. A timeout was performed prior to the initiation of the procedure. Ultrasound was used to interrogate the right upper quadrant. There is a very thin rim of heterogeneous fluid in the perihepatic space. The fluid is a heterogeneous mix of hypoechoic and echogenic material. A suitable skin entry site was selected and marked. Local anesthesia was attained by infiltration with 1% lidocaine. A small dermatotomy was made. Under real-time sonographic guidance, a 5 [place] Yueh centesis catheter was advanced into the fluid collection. Aspiration yielded a minimal amount of bloody fluid. No gross purulence. IMPRESSION: 1. Ultrasound reveals a thin rim of highly complex fluid in the perihepatic space. 2. Ultrasound aspiration yields minimal bloody fluid. This fluid collection likely represents mild perihepatic hematoma. The aspirated sample will be sent for culture. Electronically Signed   By: Jacqulynn Cadet M.D.   On: 05/30/2017 12:33    Scheduled Meds: . Chlorhexidine Gluconate Cloth  6 each Topical Q1200  . HYDROmorphone   Intravenous Q4H  . insulin aspart  0-15 Units Subcutaneous Q6H  . ipratropium-albuterol  3 mL Nebulization Q6H  . lip balm  1 application Topical BID  . LORazepam  0.5 mg Intravenous BID  . nicotine  21 mg  Transdermal Daily  . oxyCODONE  10 mg Per Tube Q4H  . pantoprazole (PROTONIX) IV  40 mg Intravenous Q12H  . thiamine injection  100 mg Intravenous Daily    Continuous Infusions: . ceFEPime (MAXIPIME) IV Stopped (05/31/17 8841)  . dextrose 5 % and 0.45% NaCl 10 mL/hr at 05/31/17 0600  . Marland KitchenTPN (CLINIMIX-E) Adult     And  . fat emulsion    . magnesium sulfate 1 - 4 g bolus IVPB 2 g (05/31/17 1134)  . metronidazole Stopped (05/31/17 0735)  . Marland KitchenTPN (CLINIMIX-E) Adult 120 mL/hr at 05/31/17 0600     LOS: 25 days     Annita Brod, MD [facility] Pager [phone]  If 7PM-7AM, please contact night-coverage [email or web address] Password [email or web address] 05/31/2017, 12:10 PM

## 2017-05-31 NOTE — Progress Notes (Signed)
Progress Note: General Surgery Service   Assessment/Plan: Patient Active Problem List   Diagnosis Date Noted  . Pressure injury of skin 05/29/2017  . Mucus plugging of bronchi   . Acute respiratory failure with hypoxemia ([place])   . Ileus ([place])   . Hypokalemia 05/14/2017  . Anastomotic leak of intestine 05/14/2017  . Severe sepsis ([place]) 05/14/2017  . Wound dehiscence, surgical 05/14/2017  . Aspiration pneumonia ([place]) 05/14/2017  . Chronic narcotic use 05/10/2017  . Anxiety state 05/10/2017  . Intra-abdominal adhesions s/p SB resection 05/06/2017 05/09/2017  . H/O total adrenalectomy ([place]) 05/06/2017  . Right adrenal mass s/p adrenalectomy 05/06/2017 05/06/2017  . Throat symptom 03/18/2016  . Multinodular goiter 01/19/2016  . Hyperthyroidism 01/19/2016  . Essential hypertension   . Diarrhea 11/30/2014  . Anemia, iron deficiency 11/01/2014  . Leukocytosis 11/03/2013  . Tobacco abuse 07/26/2013  . COPD (chronic obstructive pulmonary disease) ([place]) 07/26/2013  . Left knee pain 07/26/2013  . Pain in joint, shoulder region 05/03/2013  . GERD 07/24/2008  . 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 IR only aspirated old blood, small amount. Ativan added yesterday -continue empiric G+/- coverage -change dilaudid drip to PCA with basal -up to chair -vac change tomorrow -continue drains -continue TPN   LOS: 25 days  Chief Complaint/Subjective: Some anxiety overnight, abdominal pain moderate  Objective: Vital signs in last 24 hours: Temp:  [98.2 F (36.8 C)-99.2 F (37.3 C)] 98.2 F (36.8 C) (08/05 0322) Pulse Rate:  [74-107] 96 (08/05 0600) Resp:  [20-27] 23 (08/05 0600) BP: (81-151)/(52-85) 122/84 (08/05 0600) SpO2:  [95 %-100 %] 95 % (08/05 0757) Last BM Date:  (ileal tubes)  Intake/Output from previous day: 08/04 0701 - 08/05 0700 In: 6393.9 [I.V.:3577.2; NG/GT:30; IV  Piggyback:2766.7] Out: 3345 [Urine:950; Emesis/NG output:1700; Drains:695] Intake/Output this shift: No intake/output data recorded.  Lungs: CTAB  Cardiovascular: RRR  Abd: soft, vac in place and functional, blake with brown drainage, other tubes with minimal drainage  Extremities: no edema  Neuro: AOx4  Lab Results: CBC   Recent Labs  05/29/17 0316 05/30/17 0430  WBC 16.3* 16.4*  HGB 7.2* 7.8*  HCT 21.6* 23.5*  PLT 852* 941*   BMET  Recent Labs  05/30/17 0430 05/31/17 0315  NA 132* 133*  K 3.7 4.1  CL 95* 98*  CO2 25 26  GLUCOSE 113* 119*  BUN 17 20  CREATININE 0.57* 0.67  CALCIUM 8.6* 8.4*   PT/INR  Recent Labs  05/30/17 0900  LABPROT 15.4*  INR 1.21   ABG No results for input(s): PHART, HCO3 in the last 72 hours.  Invalid input(s): PCO2, PO2  Studies/Results:  Anti-infectives: Anti-infectives    Start     Dose/Rate Route Frequency Ordered Stop   05/29/17 2200  ceFEPIme (MAXIPIME) 2 g in dextrose 5 % 50 mL IVPB     2 g 100 mL/hr over 30 Minutes Intravenous Every 8 hours 05/29/17 2031     05/29/17 2100  metroNIDAZOLE (FLAGYL) IVPB 500 mg     500 mg 100 mL/hr over 60 Minutes Intravenous Every 8 hours 05/29/17 2029     05/26/17 2200  ceFAZolin (ANCEF) IVPB 1 g/50 mL premix  Status:  Discontinued     1 g 100 mL/hr over 30 Minutes Intravenous Every 8 hours 05/26/17 1008 05/29/17 2023   05/25/17 1100  vancomycin (VANCOCIN) 1,250 mg in sodium chloride 0.9 % 250 mL IVPB  Status:  Discontinued     1,250 mg 166.7 mL/hr over 90 Minutes Intravenous Every 12 hours 05/25/17 0955 05/26/17 0946   05/22/17 1400  anidulafungin (ERAXIS) 100 mg in sodium chloride 0.9 % 100 mL IVPB     100 mg 78 mL/hr over 100 Minutes Intravenous Every 24 hours 05/21/17 1330 05/27/17 1740   05/21/17 1400  anidulafungin (ERAXIS) 200 mg in sodium chloride 0.9 % 200 mL IVPB     200 mg 78 mL/hr over 200 Minutes Intravenous  Once 05/21/17 1330 05/21/17 1940   05/15/17 1000   anidulafungin (ERAXIS) 100 mg in sodium chloride 0.9 % 100 mL IVPB  Status:  Discontinued     100 mg 78 mL/hr over 100 Minutes Intravenous Every 24 hours 05/14/17 0829 05/16/17 0901   05/15/17 1000  metroNIDAZOLE (FLAGYL) IVPB 500 mg  Status:  Discontinued     500 mg 100 mL/hr over 60 Minutes Intravenous Every 8 hours 05/15/17 0903 05/25/17 0938   05/14/17 0900  anidulafungin (ERAXIS) 200 mg in sodium chloride 0.9 % 200 mL IVPB     200 mg 78 mL/hr over 200 Minutes Intravenous  Once 05/14/17 0829 05/14/17 1325   05/13/17 1927  vancomycin (VANCOCIN) 1-5 GM/200ML-% IVPB    Comments:  Ward, Christa   : cabinet override      05/13/17 1927 05/14/17 0744   05/12/17 1400  ceFEPIme (MAXIPIME) 1 g in dextrose 5 % 50 mL IVPB     1 g 100 mL/hr over 30 Minutes Intravenous Every 8 hours 05/12/17 0939 05/26/17 2359   05/12/17 0900  vancomycin (VANCOCIN) IVPB 1000 mg/200 mL premix  Status:  Discontinued     1,000 mg 200 mL/hr over 60 Minutes Intravenous Every 12 hours 05/12/17 0750 05/16/17 0852   05/12/17 0830  aztreonam (AZACTAM) 2 GM IVPB     2 g 100 mL/hr over 30 Minutes Intravenous  Once 05/12/17 0800 05/12/17 0957   05/06/17 0916  vancomycin (VANCOCIN) IVPB 1000 mg/200 mL premix     1,000 mg 200 mL/hr over 60 Minutes Intravenous On call to O.R. 05/06/17 0916 05/06/17 1229      Medications: Scheduled Meds: . Chlorhexidine Gluconate Cloth  6 each Topical Q1200  . enoxaparin (LOVENOX) injection  40 mg Subcutaneous Q24H  . insulin aspart  0-15 Units Subcutaneous Q6H  . ipratropium-albuterol  3 mL Nebulization Q6H  . lip balm  1 application Topical BID  . LORazepam  0.5 mg Intravenous BID  . nicotine  21 mg Transdermal Daily  . oxyCODONE  10 mg Per Tube Q4H  . pantoprazole (PROTONIX) IV  40 mg Intravenous Q24H  . thiamine injection  100 mg Intravenous Daily   Continuous Infusions: . ceFEPime (MAXIPIME) IV Stopped (05/31/17 5726)  . dextrose 5 % and 0.45% NaCl 10 mL/hr at 05/31/17 0600  .  metronidazole 500 mg (05/31/17 2035)  . Marland KitchenTPN (CLINIMIX-E) Adult 120 mL/hr at 05/31/17 0600   PRN Meds:.acetaminophen, HYDROmorphone, iopamidol, labetalol, LORazepam, magic mouthwash, menthol-cetylpyridinium, ondansetron **OR** ondansetron (ZOFRAN) IV, phenol, sodium chloride flush  Mickeal Skinner, MD Pg# [phone] [facility]

## 2017-05-31 NOTE — Progress Notes (Signed)
[facility] Note  ADMISSION DATE:  05/06/2017  CONSULTATION DATE:  05/13/17  REFERRING MD:  Kieth Brightly  CHIEF COMPLAINT:  Vent Management  Presenting HPI:  56 y.o. 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 On 7/18, he developed a leak therefore was taken back to OR for re-do of ex-lap and repair of leak.  He returned to the ICU on the vent and PCCM was consulted for vent management.   IMAGING/STUDIES: CT ABD/PELVIS W/ CONTRAST 7/18: 1. Diffuse dilatation of the small greater the large bowel with fluid levels throughout the small and large bowel, most compatible with adynamic ileus in the postoperative state. No bowel wall thickening. No discrete small bowel caliber transition to suggest obstruction. 2. Pneumoperitoneum in the right upper quadrant is presumably due to recent surgery . 3. Small postoperative fluid collection at the right adrenalectomy site without wall thickening or internal gas. 4. Small dependent right pleural effusion. 5. Patchy bibasilar lung consolidation and ground-glass attenuation with some volume loss, concerning for multilobar pneumonia and/or aspiration with some atelectasis.  VENOUS DUPLEX 7/25 >> negative for DVT, superficial vein thrombosis in the cephalic vein  MICROBIOLOGY: MRSA PCR 7/11:  Negative Abdominal Wound Culture 7/18:  Klebsiella pneumoniae (resistant to Ampicillin) Blood Cultures x2 7/19: negative  Tracheal Aspirate Culture 7/22: multiple organisms present, none predominant BAL 7/27 >> MSSA  BC 7/27 >> MRSE 1/2 > contaminant ?  ANTIBIOTICS:  PCN ALLERGIC Aztreonam 7/17 (x1 dose) Vancomycin 7/17 - 7/21 Cefeime 7/17 >>> 7/31   Eraxis 7/19 >>> 7/21, 7/26 >> 8/1 Flagyl 7/20 >>> 7/30 Vancomycin 7/30 >> 7/31 Ancef 7/31  >>  LINES/TUBES: OETT 07/18 >>> 8/2 RUE PICC 7/20 >>>  RAD ART LINE 7/18 >>> 7/23 Foley 7/28 >>>   SIGNIFICANT EVENTS: 07/11 - Admit for laparoscopic converted to open right adrenalectomy w/ small bowel resection and reanastamosis. Wound vac placed. 07/18 - Found to have leak >> taken back to OR for re-do exploratory laparotomy. Returned to ICU on vent. 07/19 - NGT pulled out and almost self-extubated. Developed cuff leak >> tube exchanged. Back to OR emergently for intestinal leak/ischemic bowel. 07/22 - Back to OR for wash out >> left with open abdomen & wound vac in place. Transfused 1u PRBC. 07/23 - Intermittent agitation, sedation adjusted  07/25 - rising temp, WBC increased. Returned to OR for closure 07/27 - Recultured, sedation down on precedex 07/30 - Remains on sedation, VAC leak over the weekend with dressing change by [facility] 07/31 - Failed SBT > tachypnea/anxiety 08/01 - Weaned 10/5  08/02 - Fever 101.1, WBC 14.2, weaned / extubated 8/3 -   Extubated yesterday, tolerating well.  O2 weaned to 1L.  Remains on dilaudid at 61m/hr.  RN reports precedex started overnight for anxiety.  Per wife, pt did not sleep > he states he "keeps seeing a flash of light when he closes his eyes".   8/4 - still on dilaudid gtt 540mh (surgical pain) and precedex. COmfortable on this thought last night require lot of prn dilaudid. Pain medsa t hoome: tylenol, advil, percocent, gabapentin, robaxin. NPO  - mild coffee ground on NG but no active bleed   SUBJECTIVE/OVERNIGHT/INTERVAL HX 05/31/17 - s/p USKoreaspiration of small perihpatic fluids 1ML yesterday by IR . Cultures sent. CCS started dilaudid PCA. Improved pain needs - dilaudid gtt down to  36m per hour. Off precedex x > 12h. NG returns still coffee groundish     Temp:  [98.2 F (36.8 C)-99.2 F (37.3 C)] 98.2 F (36.8 C) (08/05 0322) Pulse Rate:  [74-107] 96 (08/05 0600) Resp:  [20-27] 23 (08/05 0600) BP: (81-151)/(52-85) 122/84 (08/05  0600) SpO2:  [95 %-100 %] 95 % (08/05 0757)    General Appearance:    Looks better  Head:    Normocephalic, without obvious abnormality, atraumatic  Eyes:    PERRL - yes, conjunctiva/corneas - clear      Ears:    Normal external ear canals, both ears  Nose:   NG tube - yes - coffe ground?  Throat:  ETT TUBE - no , OG tube - no  Neck:   Supple,  No enlargement/tenderness/nodules     Lungs:     Clear to auscultation bilaterally  Chest wall:    No deformity  Heart:    S1 and S2 normal, no murmur, CVP - no.  Pressors - no  Abdomen:     Soft, no masses, no organomegaly  Genitalia:    Not done  Rectal:   not done  Extremities:   Extremities- intact     Skin:   Intact in exposed areas .     Neurologic:   Sedation - dilaudid gtt 483mper hours -> RASS - +1 . Moves all 4s - yes. CAM-ICU - neg for delirium . Orientation - x3+      PULMONARY  Recent Labs Lab 05/25/17 0300  PHART 7.424  PCO2ART 46.1  PO2ART 98.2  HCO3 29.6*  O2SAT 97.2    CBC  Recent Labs Lab 05/27/17 0441 05/29/17 0316 05/30/17 0430  HGB 7.1* 7.2* 7.8*  HCT 21.6* 21.6* 23.5*  WBC 14.2* 16.3* 16.4*  PLT 847* 852* 941*    COAGULATION  Recent Labs Lab 05/30/17 0900  INR 1.21    CARDIAC  No results for input(s): TROPONINI in the last 168 hours. No results for input(s): PROBNP in the last 168 hours.   CHEMISTRY  Recent Labs Lab 05/27/17 0441 05/28/17 0211 05/29/17 0316 05/30/17 0430 05/31/17 0315  NA 133* 135 134* 132* 133*  K 4.0 4.2 3.8 3.7 4.1  CL 96* 96* 98* 95* 98*  CO2 31 32 _0 GLUCOSE 130* 115* 161* 113* 119*  BUN 23* 21* _1 CREATININE 0.69 0.69 0.54* 0.57* 0.67  CALCIUM 8.3* 8.3* 8.3* 8.6* 8.4*  MG 2.0 2.0 1.8 1.7 1.9  PHOS 4.3 5.1* 3.1 3.8 4.6   Estimated Creatinine Clearance: 118.3 mL/min (by C-G formula based on SCr of 0.67 mg/dL).   LIVER  Recent Labs Lab 05/25/17 0509 05/26/17 0414 05/28/17 0211 05/30/17 0900  AST 20 24 38  --   ALT 12* 12* 18   --   ALKPHOS 80 97 215*  --   BILITOT 0.3 0.3 0.5  --   PROT 6.1* 6.6 6.7  --   ALBUMIN 1.6* 1.7* 1.7*  --   INR  --   --   --  1.21     INFECTIOUS  Recent Labs Lab 05/30/17 1004 05/31/17 0315  LATICACIDVEN 0.9 1.2     ENDOCRINE CBG (last 3)   Recent Labs  05/30/17 1818 05/30/17 2349 05/31/17 0617  GLUCAP 111* 114* 111*         IMAGING x48h  - image(s) personally visualized  -   highlighted in bold Ct Abdomen Pelvis W Contrast  Result Date:  05/29/2017 CLINICAL DATA:  Status post abdominal wound washout, wound closure and wound VAC placement on July 25th. Abdominal pain and fever. Abscess suspected. EXAM: CT ABDOMEN AND PELVIS WITH CONTRAST TECHNIQUE: Multidetector CT imaging of the abdomen and pelvis was performed using the standard protocol following bolus administration of intravenous contrast. CONTRAST:  71m ISOVUE-300 IOPAMIDOL (ISOVUE-300) INJECTION 61%, 101mISOVUE-300 IOPAMIDOL (ISOVUE-300) INJECTION 61% COMPARISON:  None. FINDINGS: Lower chest: Small right pleural effusion. Mild bibasilar atelectasis. Hepatobiliary: No focal liver abnormality is seen. Gallbladder is mildly distended but otherwise unremarkable. No bile duct dilatation seen. Pancreas: Unremarkable. No pancreatic ductal dilatation or surrounding inflammatory changes. Spleen: Normal in size without focal abnormality. Adrenals/Urinary Tract: Status post right adrenalectomy. Fluid collection in the right adrenalectomy bed has decreased in size, now measuring approximately 4 x 2 cm (previously 5 x 4 cm). Left adrenal gland appears normal. Kidneys are unremarkable without mass or hydronephrosis. Small amount of air in the bladder, likely related to recent catheterization. Bladder otherwise unremarkable. Stomach/Bowel: Status post partial colectomy with surgical bowel anastomoses in the right upper quadrant. Two percutaneous jejunostomy tubes in place, grossly appropriate in position. Additional percutaneous  drainage catheter in place with tip projecting across the midline into the anterior left abdomen. No dilated large or small bowel loops.  Enteric tube in the stomach. Vascular/Lymphatic: Aortic atherosclerosis. No acute appearing vascular abnormality. Scattered small and mildly prominent lymph nodes within the abdomen and pelvis, likely reactive in nature. Reproductive: Prostate is unremarkable. Other: Small amount of free fluid in the abdomen and pelvis. Probable thin abscess collection along the anterior and lateral aspects of the liver, measuring approximately 2 cm greatest thickness, extending for approximately 20 cm along the anterolateral aspects of the liver. No free intraperitoneal air seen. Expected foci of extraperitoneal air seen along the right abdominal wall surgical defect. Musculoskeletal: No acute or suspicious osseous finding. IMPRESSION: 1. Thick-walled fluid collection along the anterior and lateral aspects of the liver. The collection is thin measuring approximately 2 cm greatest thickness (measured on the coronal reconstructions series 5, image 24) but extends for approximately 20 cm along the anterior and lateral margins of the liver (axial series 2, image 22). This is a presumed abscess collection. 2. No evidence of additional abscess collection within the abdomen or pelvis. 3. Small free fluid in the abdomen and pelvis. 4. Nonobstructive bowel gas pattern. Surgical changes of partial bowel resection. No evidence of surgical complicating feature seen. 5. Small right pleural effusion. Small consolidations at each lung base are most likely atelectasis. Pneumonia or aspiration pneumonitis are possible but considered less likely. 6. Support apparatus appears appropriately positioned. 7. Decrease in size of the fluid collection at the right adrenalectomy bed, now measuring 4 x 2 cm (previously 5 x 4 cm). 8. Aortic atherosclerosis. These results will be called to the ordering clinician or  representative by the Radiologist Assistant, and communication documented in the PACS or zVision Dashboard. Electronically Signed   By: StFranki Cabot.D.   On: 05/29/2017 17:15   UsKoreaspiration  Result Date: 05/30/2017 INDICATION: [age]ear old male status post complicated right adrenalectomy with small bowel injury, resection and anastomotic leak necessitating re- operative intervention. He currently has a thin rim of peripherally enhancing fluid in the perihepatic space and presents for ultrasound-guided aspiration. EXAM: Ultrasound-guided aspiration MEDICATIONS: The patient is currently admitted to the hospital and receiving intravenous antibiotics. The antibiotics were administered within an appropriate time frame prior to the initiation of the procedure. ANESTHESIA/SEDATION: None COMPLICATIONS: None immediate.  PROCEDURE: Informed written consent was obtained from the patient after a thorough discussion of the procedural risks, benefits and alternatives. All questions were addressed. A timeout was performed prior to the initiation of the procedure. Ultrasound was used to interrogate the right upper quadrant. There is a very thin rim of heterogeneous fluid in the perihepatic space. The fluid is a heterogeneous mix of hypoechoic and echogenic material. A suitable skin entry site was selected and marked. Local anesthesia was attained by infiltration with 1% lidocaine. A small dermatotomy was made. Under real-time sonographic guidance, a 5 [place] Yueh centesis catheter was advanced into the fluid collection. Aspiration yielded a minimal amount of bloody fluid. No gross purulence. IMPRESSION: 1. Ultrasound reveals a thin rim of highly complex fluid in the perihepatic space. 2. Ultrasound aspiration yields minimal bloody fluid. This fluid collection likely represents mild perihepatic hematoma. The aspirated sample will be sent for culture. Electronically Signed   By: Jacqulynn Cadet M.D.   On: 05/30/2017 12:33    Dg Chest Port 1 View  Result Date: 05/30/2017 CLINICAL DATA:  History of total adrenalectomy. EXAM: PORTABLE CHEST 1 VIEW COMPARISON:  Prior radiograph from 05/29/2017. FINDINGS: Enteric tube courses in the abdomen. Cardiac and mediastinal silhouettes are stable. Aortic atherosclerosis. Right PICC catheter terminates at the cavoatrial junction. Elevation the right hemidiaphragm with associated right basilar opacity, atelectasis or infiltrate. Mild left basilar subsegmental atelectasis. No appreciable pleural effusion. Perihilar vascular congestion without pulmonary edema. No pneumothorax. No acute osseus abnormality.  Cervical ACDF noted. IMPRESSION: 1. Elevation of the right hemidiaphragm with similar right basilar opacity, either atelectasis or infiltrate. Mild left basilar subsegmental atelectasis. 2. Stable position of right PICC catheter. Electronically Signed   By: Jeannine Boga M.D.   On: 05/30/2017 06:34       ASSESSMENT/PLAN:  56 y.o. male admitted with adrenal mass positive for metanephrine's s/p right adrenalectomy complicated by small bowel injury in the setting of adhesions with sepsis secondary to intra-abdominal infection. Intubated for almost 2 weeks, required multiple sedatives (chronic ativan & percocet use).  Sepsis - anastomotic leak resulting in peritonitis. MSSA HCAP    Fever - intermittent, ongoing.      - 05/31/2017 -afebrile since 05/28/17 . Completed 14 days cefepime, 7 days Eraxis   P: Continue flagyl IV and Ancef per CCS Monitor fever curve / WBC trend   Acute hypoxic respiratory failure: in setting of sepsis, post-op adrenalectomy. Tobacco Abuse    - sp extubation and maintains resp status   P:  Monitor post extubation  O2 as needed to support sats > 92% Intermittent CXR  Duoneb to Q6    Essential hypertension - in setting of pheochromocytoma s/p resection  P: ICU monitoring of hemodynamics PRN labetalol for SBP > 170   Intestinal  perforation - in setting of adhesions during adrenalectomy s/p reanastamosis Protein calorie malnutrition  05/31/17 - having coffee ground returns but so far bp /hr stability and cbc stable.   P: Increase PPI to bid Check CBC stat 05/31/2017 Post-op care per CCS NGT to LIS  TPN per pharmacy  CCS ok with oxycodone PT   Anemia - bloody drainage from NGT  Thrombocytosis   P: Trend CBC  - repeat 05/31/2017 Lovenox for DVT prophylaxis  - hold 05/31/2017  due to potential coffee ground returns Transfuse for Hgb <7%     History of hypothyroidism  Hyperglycemia P: Repeat TSH ~ 9/1  CBG with SSI    Acute encephalopathy - on ativan, percocet at  home Tobacco use  - 05/31/2017 - severe post op oain (neednig dilaudid gtt). Abd soft. Only on somme baseline pain meds at home. Overall improved  P: dilaudidto PCA Continue home ativan Dc precedex  PT efforts > OOB, mobilize as able    Code Status:  Full Code   Disposition:  ICU -> can move to sDU if off gtt and doing ok on PCA  Family Update: Wife updated at bedside am 05/31/17      Dr. Brand Males, M.D., [facility].C.P Pulmonary and Critical Care Medicine Staff Physician [place] [facility] Pager: [phone], If no answer or between  15:00h - 7:00h: call [phone]  05/31/2017 9:54 AM

## 2017-06-01 DIAGNOSIS — Z720 Tobacco use: Secondary | ICD-10-CM

## 2017-06-01 DIAGNOSIS — K659 Peritonitis, unspecified: Secondary | ICD-10-CM

## 2017-06-01 DIAGNOSIS — K660 Peritoneal adhesions (postprocedural) (postinfection): Secondary | ICD-10-CM

## 2017-06-01 LAB — CBC WITH DIFFERENTIAL/PLATELET
BASOS ABS: 0.1 10*3/uL (ref 0.0–0.1)
BASOS PCT: 0 %
EOS ABS: 0.5 10*3/uL (ref 0.0–0.7)
EOS PCT: 4 %
HCT: 31.3 % — ABNORMAL LOW (ref 39.0–52.0)
Hemoglobin: 10.4 g/dL — ABNORMAL LOW (ref 13.0–17.0)
Lymphocytes Relative: 21 %
Lymphs Abs: 2.6 10*3/uL (ref 0.7–4.0)
MCH: 26.5 pg (ref 26.0–34.0)
MCHC: 33.2 g/dL (ref 30.0–36.0)
MCV: 79.8 fL (ref 78.0–100.0)
MONO ABS: 0.9 10*3/uL (ref 0.1–1.0)
Monocytes Relative: 7 %
Neutro Abs: 8.5 10*3/uL — ABNORMAL HIGH (ref 1.7–7.7)
Neutrophils Relative %: 68 %
PLATELETS: 742 10*3/uL — AB (ref 150–400)
RBC: 3.92 MIL/uL — ABNORMAL LOW (ref 4.22–5.81)
RDW: 16.4 % — AB (ref 11.5–15.5)
WBC: 12.6 10*3/uL — AB (ref 4.0–10.5)

## 2017-06-01 LAB — COMPREHENSIVE METABOLIC PANEL
ALT: 37 U/L (ref 17–63)
AST: 41 U/L (ref 15–41)
Albumin: 2.2 g/dL — ABNORMAL LOW (ref 3.5–5.0)
Alkaline Phosphatase: 269 U/L — ABNORMAL HIGH (ref 38–126)
Anion gap: 10 (ref 5–15)
BILIRUBIN TOTAL: 0.6 mg/dL (ref 0.3–1.2)
BUN: 20 mg/dL (ref 6–20)
CO2: 25 mmol/L (ref 22–32)
CREATININE: 0.67 mg/dL (ref 0.61–1.24)
Calcium: 8.6 mg/dL — ABNORMAL LOW (ref 8.9–10.3)
Chloride: 99 mmol/L — ABNORMAL LOW (ref 101–111)
Glucose, Bld: 109 mg/dL — ABNORMAL HIGH (ref 65–99)
Potassium: 4.3 mmol/L (ref 3.5–5.1)
Sodium: 134 mmol/L — ABNORMAL LOW (ref 135–145)
TOTAL PROTEIN: 7.3 g/dL (ref 6.5–8.1)

## 2017-06-01 LAB — GLUCOSE, CAPILLARY
GLUCOSE-CAPILLARY: 107 mg/dL — AB (ref 65–99)
GLUCOSE-CAPILLARY: 112 mg/dL — AB (ref 65–99)
GLUCOSE-CAPILLARY: 124 mg/dL — AB (ref 65–99)
GLUCOSE-CAPILLARY: 131 mg/dL — AB (ref 65–99)

## 2017-06-01 LAB — PHOSPHORUS: Phosphorus: 4.5 mg/dL (ref 2.5–4.6)

## 2017-06-01 LAB — MAGNESIUM: Magnesium: 2.1 mg/dL (ref 1.7–2.4)

## 2017-06-01 LAB — PREALBUMIN: PREALBUMIN: 24.8 mg/dL (ref 18–38)

## 2017-06-01 MED ORDER — TRACE MINERALS CR-CU-MN-SE-ZN 10-1000-500-60 MCG/ML IV SOLN
INTRAVENOUS | Status: AC
  Administered 2017-06-01: 18:00:00 via INTRAVENOUS
  Filled 2017-06-01: qty 2880

## 2017-06-01 MED ORDER — FAT EMULSION 20 % IV EMUL
240.0000 mL | INTRAVENOUS | Status: AC
  Administered 2017-06-01: 240 mL via INTRAVENOUS
  Filled 2017-06-01: qty 250

## 2017-06-01 MED ORDER — LORAZEPAM 2 MG/ML IJ SOLN
1.0000 mg | INTRAMUSCULAR | Status: DC | PRN
  Administered 2017-06-01 – 2017-06-14 (×38): 1 mg via INTRAVENOUS
  Filled 2017-06-01 (×39): qty 1

## 2017-06-01 MED ORDER — LORAZEPAM 2 MG/ML PO CONC
2.0000 mg | Freq: Four times a day (QID) | ORAL | Status: DC
  Administered 2017-06-01 – 2017-06-22 (×84): 2 mg via ORAL
  Filled 2017-06-01 (×85): qty 1

## 2017-06-01 NOTE — Progress Notes (Signed)
Pharmacy Antibiotic Note  [name] [name] is a 56 y.o. male admitted on 05/06/2017. Patient underwent elective right adrenalectomy 7/79, complicated by small bowel injury requiring resection and anastomosis and an anastomotic leak requiring repair on 7/18, then abdominal wound washout, wound closure, and wound vac placement on 7/25. Course complicated by peritonitis and MSSA pneumonia. CT of abdomen and pelvis 8/3 shows fluid collection along the anterior and lateral aspects of the liver, which is a presumed abscess collection.  Antibiotics broadened from Cefazolin to Cefepime and Metronidazole on 8/3.  WBC down to 12.6, AF, Creat stable.  Abx to end 8/8 per CCS note pending no fever/WBC  Plan: Cefepime 2g IV q8h Metronidazole 500mg  IV q8h Monitor renal function, cultures, clinical course Anticipated end date 8/8 per CCS note if remains stable  Height: 5\' 10"  (177.8 cm) Weight: 205 lb 11 oz (93.3 kg) IBW/kg (Calculated) : 73  Temp (24hrs), Avg:98 F (36.7 C), Min:97.3 F (36.3 C), Max:98.5 F (36.9 C)   Recent Labs Lab 05/27/17 0441 05/28/17 0211 05/29/17 0316 05/30/17 0430 05/30/17 1004 05/31/17 0315 05/31/17 1235 06/01/17 0543  WBC 14.2*  --  16.3* 16.4*  --   --  14.5* 12.6*  CREATININE 0.69 0.69 0.54* 0.57*  --  0.67  --  0.67  LATICACIDVEN  --   --   --   --  0.9 1.2  --   --     Estimated Creatinine Clearance: 118.3 mL/min (by C-G formula based on SCr of 0.67 mg/dL).    Allergies  Allergen Reactions  . Chantix [Varenicline] Palpitations    Heart race Increased panic attacks  . Morphine Itching  . Penicillins Rash    Has patient had a PCN reaction causing immediate rash, facial/tongue/throat swelling, SOB or lightheadedness with hypotension: Yes Has patient had a PCN reaction causing severe rash involving mucus membranes or skin necrosis: Unknown Has patient had a PCN reaction that required hospitalization: No Has patient had a PCN reaction occurring within the last  10 years: No If all of the above answers are "NO", then may proceed with Cephalosporin use.     Antimicrobials this admission: 7/17 Vancomcyin >> 7/21, resume 7/30 >> 7/31 7/17 Cefepime >> 7/31, resume 8/3 >> 7/19 Anidulafungin >> 7/21, resume 7/26 >>  7/20 metronidazole >> 7/30, resume 8/3 >> 7/31 Cefazolin >> 8/3  Dose adjustments this admission:  Microbiology results: 7/11 MRSA PCR: negative 7/18 Abdominal culture:  moderate Klebsiella pna - Pan sens except amp 7/19 BCx: NGF 7/22 Trach asp: multple orgs, none predominant 7/27 Trach asp: rare MSSA 7/27 BCx: 1/2 CoNS 8/4 perihepatic fluid: ngtd  Eudelia Bunch, Pharm.D. [phone] 06/01/2017 8:56 AM

## 2017-06-01 NOTE — Progress Notes (Signed)
Physical Therapy Treatment Patient Details Name: [name] [name] MRN: [id number] DOB: 1961/04/05 Today's Date: 06/01/2017    History of Present Illness  56 y.o. 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 On 7/18, he developed a leak therefore was taken back to OR for re-do of ex-lap and repair of leak on 05/20/17.  He returned to the ICU on the vent . Extubated on 05/28/17.    PT Comments    Pt assisted OOB to recliner and continues to have LE buckling with taking a few steps over to recliner.  Pt with multiple lines/leads/tubes and requires increased time to perform mobility.  Follow Up Recommendations  Home health PT;Supervision/Assistance - 24 hour     Equipment Recommendations  Rolling walker with 5" wheels    Recommendations for Other Services       Precautions / Restrictions Precautions Precautions: Fall Precaution Comments: multiple lines in abdomen, VAC and NG    Mobility  Bed Mobility Overal bed mobility: Needs Assistance Bed Mobility: Supine to Sit     Supine to sit: Mod assist;HOB elevated     General bed mobility comments: assist for trunk upright  Transfers Overall transfer level: Needs assistance Equipment used: Rolling walker (2 wheeled) Transfers: Stand Pivot Transfers;Sit to/from Stand Sit to Stand: Min assist;+2 safety/equipment;+2 physical assistance Stand pivot transfers: +2 safety/equipment;+2 physical assistance;Mod assist       General transfer comment: assist to rise and steady as well as control descent, verbal cues for safe technique, LEs buckling with taking a couple steps over to recliner  Ambulation/Gait                 Stairs            Wheelchair Mobility    Modified Rankin (Stroke Patients Only)       Balance           Standing  balance support: During functional activity;Bilateral upper extremity supported Standing balance-Leahy Scale: Poor Standing balance comment: requires UE support for static standing                            Cognition Arousal/Alertness: Awake/alert Behavior During Therapy: Flat affect Overall Cognitive Status: Within Functional Limits for tasks assessed                                        Exercises      General Comments        Pertinent Vitals/Pain Pain Assessment: 0-10 Pain Score: 6  Pain Location: abdomen Pain Descriptors / Indicators: Grimacing;Sore Pain Intervention(s): Limited activity within patient's tolerance;Monitored during session;Repositioned;PCA encouraged    Home Living                      Prior Function            PT Goals (current goals can now be found in the care plan section) Progress towards PT goals: Progressing toward goals    Frequency    Min 3X/week      PT Plan Current plan remains appropriate    Co-evaluation              AM-PAC PT "6 Clicks" Daily  Activity  Outcome Measure  Difficulty turning over in bed (including adjusting bedclothes, sheets and blankets)?: Total Difficulty moving from lying on back to sitting on the side of the bed? : Total Difficulty sitting down on and standing up from a chair with arms (e.g., wheelchair, bedside commode, etc,.)?: Total Help needed moving to and from a bed to chair (including a wheelchair)?: A Lot Help needed walking in hospital room?: A Lot Help needed climbing 3-5 steps with a railing? : Total 6 Click Score: 8    End of Session Equipment Utilized During Treatment: Gait belt Activity Tolerance: Patient tolerated treatment well Patient left: in chair;with call bell/phone within reach;with family/visitor present Nurse Communication: Mobility status PT Visit Diagnosis: Difficulty in walking, not elsewhere classified (R26.2);Muscle weakness  (generalized) (M62.81)     Time: 5041-3643 PT Time Calculation (min) (ACUTE ONLY): 27 min  Charges:  $Therapeutic Activity: 8-22 mins                    G Codes:       Carmelia Bake, PT, DPT 06/01/2017 Pager: [phone]  York Ram E 06/01/2017, 1:11 PM

## 2017-06-01 NOTE — Progress Notes (Signed)
OT Cancellation Note  Patient Details Name: [name] [name] MRN: [id number] DOB: 1961-03-10   Cancelled Treatment:    Reason Eval/Treat Not Completed: Pain limiting ability to participate.  Will reattempt.  [name] [place], OTR/L [phone]   [name] 06/01/2017, 5:35 PM

## 2017-06-01 NOTE — Care Management Note (Signed)
Case Management Note  Patient Details  Name: [name] [name] MRN: [id number] Date of Birth: 1961/10/24  Subjective/Objective: npo/iv flds/ngtube/Iv diluadid pca drip.                   Action/Plan: Date:  June 01, 2017 Chart reviewed for concurrent status and case management needs. Will continue to follow patient progress. Discharge Planning: following for needs Expected discharge date: [id number] [name], BSN, [place], [place]  Expected Discharge Date:                  Expected Discharge Plan:  Home/Self Care  In-House Referral:     Discharge planning Services  CM Consult  Post Acute Care Choice:    Choice offered to:     DME Arranged:    DME Agency:     HH Arranged:    HH Agency:     Status of Service:  In process, will continue to follow  If discussed at Long Length of Stay Meetings, dates discussed:    Additional Comments:  Leeroy Cha, RN 06/01/2017, 9:56 AM

## 2017-06-01 NOTE — Progress Notes (Signed)
Progress Note: General Surgery Service   Assessment/Plan: Patient Active Problem List   Diagnosis Date Noted  . Pressure injury of skin 05/29/2017  . Mucus plugging of bronchi   . Acute respiratory failure with hypoxemia ([place])   . Ileus ([place])   . Hypokalemia 05/14/2017  . Anastomotic leak of intestine 05/14/2017  . Severe sepsis ([place]) 05/14/2017  . Wound dehiscence, surgical 05/14/2017  . Aspiration pneumonia ([place]) 05/14/2017  . Chronic narcotic use 05/10/2017  . Anxiety state 05/10/2017  . Intra-abdominal adhesions s/p SB resection 05/06/2017 05/09/2017  . H/O total adrenalectomy ([place]) 05/06/2017  . Right adrenal mass s/p adrenalectomy 05/06/2017 05/06/2017  . Throat symptom 03/18/2016  . Multinodular goiter 01/19/2016  . Hyperthyroidism 01/19/2016  . Essential hypertension   . Diarrhea 11/30/2014  . Anemia, iron deficiency 11/01/2014  . Leukocytosis 11/03/2013  . Tobacco abuse 07/26/2013  . COPD (chronic obstructive pulmonary disease) ([place]) 07/26/2013  . Left knee pain 07/26/2013  . Pain in joint, shoulder region 05/03/2013  . GERD 07/24/2008  . 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   s/p Procedure(s): ABDOMINAL WOUND [place] OUT, WOUND CLOSURE AND WOUND VAC PLACEMENT 05/20/2017 -per tube ativan -increase ativan prn frequency -ok for ice chips -vac changed today -plan to stop antibiotics 8/8 unless no fever/wbc    LOS: 26 days  Chief Complaint/Subjective: Difficulty with anxiety overnight, pain moderate, needing reminding to use PCA  Objective: Vital signs in last 24 hours: Temp:  [97.3 F (36.3 C)-98.3 F (36.8 C)] 98 F (36.7 C) (08/06 0400) Pulse Rate:  [96-112] 100 (08/06 0400) Resp:  [18-30] 28 (08/06 0400) BP: (112-143)/(69-85) 128/79 (08/06 0400) SpO2:  [95 %-100 %] 96 % (08/06 0400) Last BM Date: 05/31/17 (has an ileostomy drain)  Intake/Output from previous day: 08/05 0701 - 08/06 0700 In: 3712.8 [I.V.:3462.8; IV Piggyback:250] Out: 5038  [Urine:300; Emesis/NG output:950; Drains:580] Intake/Output this shift: Total I/O In: 1800 [I.V.:1650; IV Piggyback:150] Out: 8828 [Urine:300; Emesis/NG output:950; Drains:580]  Lungs: CTAB  Cardiovascular: tachycardic  Abd: soft, vac removed, mesh incorporating well (bed 23 x 7cm), drains in, blake with thick brown liquid  Extremities: no edema  Neuro: AOx4  Lab Results: CBC   Recent Labs  05/31/17 1235 06/01/17 0543  WBC 14.5* 12.6*  HGB 8.1* 10.4*  HCT 24.1* 31.3*  PLT 890* 742*   BMET  Recent Labs  05/30/17 0430 05/31/17 0315  NA 132* 133*  K 3.7 4.1  CL 95* 98*  CO2 25 26  GLUCOSE 113* 119*  BUN 17 20  CREATININE 0.57* 0.67  CALCIUM 8.6* 8.4*   PT/INR  Recent Labs  05/30/17 0900  LABPROT 15.4*  INR 1.21   ABG No results for input(s): PHART, HCO3 in the last 72 hours.  Invalid input(s): PCO2, PO2  Studies/Results:  Anti-infectives: Anti-infectives    Start     Dose/Rate Route Frequency Ordered Stop   05/29/17 2200  ceFEPIme (MAXIPIME) 2 g in dextrose 5 % 50 mL IVPB     2 g 100 mL/hr over 30 Minutes Intravenous Every 8 hours 05/29/17 2031     05/29/17 2100  metroNIDAZOLE (FLAGYL) IVPB 500 mg     500 mg 100 mL/hr over 60 Minutes Intravenous Every 8 hours 05/29/17 2029     05/26/17 2200  ceFAZolin (ANCEF) IVPB 1 g/50 mL premix  Status:  Discontinued     1 g 100 mL/hr over 30 Minutes Intravenous Every 8 hours 05/26/17 1008 05/29/17 2023   05/25/17 1100  vancomycin (VANCOCIN) 1,250 mg  in sodium chloride 0.9 % 250 mL IVPB  Status:  Discontinued     1,250 mg 166.7 mL/hr over 90 Minutes Intravenous Every 12 hours 05/25/17 0955 05/26/17 0946   05/22/17 1400  anidulafungin (ERAXIS) 100 mg in sodium chloride 0.9 % 100 mL IVPB     100 mg 78 mL/hr over 100 Minutes Intravenous Every 24 hours 05/21/17 1330 05/27/17 1740   05/21/17 1400  anidulafungin (ERAXIS) 200 mg in sodium chloride 0.9 % 200 mL IVPB     200 mg 78 mL/hr over 200 Minutes Intravenous   Once 05/21/17 1330 05/21/17 1940   05/15/17 1000  anidulafungin (ERAXIS) 100 mg in sodium chloride 0.9 % 100 mL IVPB  Status:  Discontinued     100 mg 78 mL/hr over 100 Minutes Intravenous Every 24 hours 05/14/17 0829 05/16/17 0901   05/15/17 1000  metroNIDAZOLE (FLAGYL) IVPB 500 mg  Status:  Discontinued     500 mg 100 mL/hr over 60 Minutes Intravenous Every 8 hours 05/15/17 0903 05/25/17 0938   05/14/17 0900  anidulafungin (ERAXIS) 200 mg in sodium chloride 0.9 % 200 mL IVPB     200 mg 78 mL/hr over 200 Minutes Intravenous  Once 05/14/17 0829 05/14/17 1325   05/13/17 1927  vancomycin (VANCOCIN) 1-5 GM/200ML-% IVPB    Comments:  Ward, Christa   : cabinet override      05/13/17 1927 05/14/17 0744   05/12/17 1400  ceFEPIme (MAXIPIME) 1 g in dextrose 5 % 50 mL IVPB     1 g 100 mL/hr over 30 Minutes Intravenous Every 8 hours 05/12/17 0939 05/26/17 2359   05/12/17 0900  vancomycin (VANCOCIN) IVPB 1000 mg/200 mL premix  Status:  Discontinued     1,000 mg 200 mL/hr over 60 Minutes Intravenous Every 12 hours 05/12/17 0750 05/16/17 0852   05/12/17 0830  aztreonam (AZACTAM) 2 GM IVPB     2 g 100 mL/hr over 30 Minutes Intravenous  Once 05/12/17 0800 05/12/17 0957   05/06/17 0916  vancomycin (VANCOCIN) IVPB 1000 mg/200 mL premix     1,000 mg 200 mL/hr over 60 Minutes Intravenous On call to O.R. 05/06/17 0916 05/06/17 1229      Medications: Scheduled Meds: . chlorhexidine  15 mL Mouth Rinse BID  . Chlorhexidine Gluconate Cloth  6 each Topical Q1200  . HYDROmorphone   Intravenous Q4H  . insulin aspart  0-15 Units Subcutaneous Q6H  . ipratropium-albuterol  3 mL Nebulization BID  . lip balm  1 application Topical BID  . LORazepam  2 mg Oral Q6H  . mouth rinse  15 mL Mouth Rinse q12n4p  . nicotine  21 mg Transdermal Daily  . oxyCODONE  10 mg Per Tube Q4H  . pantoprazole (PROTONIX) IV  40 mg Intravenous Q12H  . thiamine injection  100 mg Intravenous Daily   Continuous Infusions: .  ceFEPime (MAXIPIME) IV 2 g (06/01/17 3545)  . dextrose 5 % and 0.45% NaCl 10 mL/hr at 05/31/17 0600  . metronidazole 500 mg (06/01/17 0622)  . Marland KitchenTPN (CLINIMIX-E) Adult 120 mL/hr at 05/31/17 1733   PRN Meds:.HYDROmorphone, iopamidol, labetalol, LORazepam, magic mouthwash, menthol-cetylpyridinium, naloxone **AND** sodium chloride flush, ondansetron **OR** ondansetron (ZOFRAN) IV, phenol, sodium chloride flush  Mickeal Skinner, MD Pg# [phone] [facility]

## 2017-06-01 NOTE — Progress Notes (Signed)
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  Patient's course was complicated by small bowel injury requiring resection with anastomoses and placement of wound VAC. On 7/18, patient developed leak and was taken back to the operating room for redo of his exploratory laparotomy and repair of leak. That time, patient returned to the ICU on the ventilator. Critical care was consulted for ventilator/medical management.  Over the next 2 weeks, patient's hospital course was complicated with the following: 07/19 - NGT pulled out and almost self-extubated. Developed cuff leak >> tube exchanged. Back to OR emergently for intestinal leak/ischemic bowel.TPN started at that time 07/22 - Back to OR for wash out >> left with open abdomen & wound vac in place. Transfused 1u PRBC. 07/25 - rising temp, WBC increased. Returned to OR for closure 07/27 - Recultured, sedation down on precedex [facility] 7/27 >> MRSE 1/2 > contaminant ? 07/30 - Remains on sedation, VAC leak over the weekend with dressing change by [facility] 07/31 - Initial attempt at weaning failed to due to tachypnea/anxiety 08/02 - Fever 101.1, WBC 14.2, weaned / extubated 8/3 - Extubated yesterday, tolerating well. O2 weaned to 1L. Remains on dilaudid at 5mg /hr. RN reports precedex started overnight for anxiety. 8/4: Ultrasound-guided aspiration By interventional radiology of small perihepatic fluid collection yielded only 1 mL of the fluid, sent for culture.  NPO  - mild coffee ground on NG but no active bleed  As of 8/5, patient  improving. Off Precedex drip since 1908/4. Dilaudid Drip being changed over to PCA. Still with some coffee ground -looking material in NG tube.  White blood cell count staying elevated at 16 for past few days. Hemoglobin Stable for the past week between 7-8. Still on TPN  Assessment/Plan: Sepsis -Secondary to peritonitis and MSSA HCAP -05/31/2017 lactic acid 1.2 -WBC trending down, patient has remained afebrile last 72 hours -Completed cefepime 14 days (05/12/2017>> 05/26/2017) -Continuing cefepime and metronidazole per surgery -Please see summary of antibiotics below  Acute respiratory failure with hypoxia -Secondary to sepsis and HCAP post op adrenalectomy -Presently stable on 1 L -Extubated 05/28/2017 -Maintain oxygen saturation greater than 92% -continue Duonebs -05/30/2017--personally reviewed CXR--bibasilar atelectasis, no consolidation  Peritonitis/intestinal perforation/anastomotic leak -05/29/2017 CT abdomen and pelvis thick walled fluid collection along anterior and lateral aspects of the liver; extends 20 cm along anterior and lateral margins of liver -05/30/2017 IR aspiration--cultures remain negative; suspected hematoma -Continue cefepime and metronidazole per general surgery -05/20/17--ABDOMINAL WOUND [place] OUT, WOUND CLOSURE AND WOUND VAC PLACEMENT  -NG remains LIS--management per surgery  MSSA HCAP -finished 7 days of cefepime  Acute blood loss anemia -Baseline Hgb ~12 prior to admission -Hgb 8.1>>10.4 in past 24 hours likely spurious -had coffee ground returns but so far bp /hr stability and cbc stable -Continue PPI twice a day -TPN per general surgery and pharmacy -Follow CBC -post op care per general surgery -holding lovenox -transfuse for Hgb<7  R-adrenal mass/pheochromocytoma -s/p R-adrenalectomy -complicated by small bowel injury in the setting of adhesions with sepsis secondary to intra-abdominal infection -post-op care per  surgery  Thrombocytosis -  likely acute phase reactant -trending down  Essential Hypertension -remains stable off home clonidine, labetalol, chlorthalidone, valsartan  Chronic pain syndrome/Post-op pain -had significant post-op pain -remains on dilaudid PCA, po oxycodone scheduled, ativan scheduled -pain management per [facility] -previously on home gaba, percocet  Hyperlipidemia -remain off lipitor until able to tolerate po reliably  Tobacco abuse -nicoderm patch  Hyperglycemia -likely stress induced and TPN -check A1C -continue novolog sliding scale    Disposition Plan:   Remain in stepdown Family Communication:   No Family at bedside--Total time spent 45 minutes.  Greater than 50% spent face to face counseling and coordinating care.   Consultants: General surgery is primary   Code Status:  FULL   DVT Prophylaxis:  SCDs   Procedures: As Listed in Progress Note Above  Antibiotics: Vancomycin 7/17 >>>7/21; 7/30 >> 8/1 Eraxis 7/19 >>> 7/21, 7/26 >> 8/1 Cefazolin 7/31>>>8/3 Cefepime 7/17 >>> 7/31 ; 8/3>>> Flagyl 7/20 >>> 7/30; 8/3>>>     Subjective: Patient is slow in responding but answers properly. He denies any nausea, vomiting, diarrhea. He is passing flatus. No bowel movement in the last 24 hours. He feels that his abdominal pain is controlled. Denies any chest pain, short of breath, headache.  Objective: Vitals:   06/01/17 0600 06/01/17 0700 06/01/17 0720 06/01/17 0744  BP:      Pulse: 98     Resp: (!) 23 (!) 23  (!) 29  Temp:      TempSrc:      SpO2: 96%  96% 95%  Weight:      Height:        Intake/Output Summary (Last 24 hours) at 06/01/17 0812 Last data filed at 06/01/17 5329  Gross per 24 hour  Intake          3876.48 ml  Output             1980 ml  Net          1896.48 ml   Weight change:  Exam:   General:  Pt is alert, follows commands appropriately, not in acute distress  HEENT: No icterus, No thrush, No neck mass,  [place]/AT  Cardiovascular: RRR, S1/S2, no rubs, no gallops  Respiratory: Bibasilar crackles. No wheezes. Good air movement.  Abdomen: Soft/+BS, non tender, non distended, no guarding  Extremities: No edema, No lymphangitis, No petechiae, No rashes, no synovitis   Data Reviewed: I have personally reviewed following labs and imaging studies Basic Metabolic Panel:  Recent Labs Lab 05/28/17 0211 05/29/17 0316 05/30/17 0430 05/31/17 0315 06/01/17 0543  NA 135 134* 132* 133* 134*  K 4.2 3.8 3.7 4.1 4.3  CL 96* 98* 95* 98* 99*  CO2 32 28 25 26 25   GLUCOSE 115* 161* 113* 119* 109*  BUN 21* 18 17 20 20   CREATININE 0.69 0.54* 0.57* 0.67 0.67  CALCIUM 8.3* 8.3* 8.6* 8.4* 8.6*  MG 2.0 1.8 1.7 1.9 2.1  PHOS 5.1* 3.1 3.8 4.6 4.5   Liver Function Tests:  Recent Labs Lab 05/26/17 0414 05/28/17 0211 06/01/17 0543  AST 24 38 41  ALT 12* 18 37  ALKPHOS 97 215* 269*  BILITOT 0.3 0.5 0.6  PROT 6.6 6.7 7.3  ALBUMIN 1.7* 1.7* 2.2*   No results for input(s): LIPASE, AMYLASE in the last 168 hours. No results for input(s): AMMONIA in the last 168 hours. Coagulation Profile:  Recent Labs Lab 05/30/17 0900  INR 1.21   CBC:  Recent Labs Lab 05/27/17 0441 05/29/17 0316 05/30/17 0430 05/31/17  1235 06/01/17 0543  WBC 14.2* 16.3* 16.4* 14.5* 12.6*  NEUTROABS  --   --   --  9.7* 8.5*  HGB 7.1* 7.2* 7.8* 8.1* 10.4*  HCT 21.6* 21.6* 23.5* 24.1* 31.3*  MCV 82.4 81.2 80.5 78.2 79.8  PLT 847* 852* 941* 890* 742*   Cardiac Enzymes: No results for input(s): CKTOTAL, CKMB, CKMBINDEX, TROPONINI in the last 168 hours. BNP: Invalid input(s): POCBNP CBG:  Recent Labs Lab 05/31/17 0617 05/31/17 1239 05/31/17 1806 05/31/17 2349 06/01/17 0553  GLUCAP 111* 105* 124* 121* 112*   HbA1C: No results for input(s): HGBA1C in the last 72 hours. Urine analysis:    Component Value Date/Time   COLORURINE AMBER (A) 05/22/2017 1300   APPEARANCEUR CLEAR 05/22/2017 1300   LABSPEC 1.021  05/22/2017 1300   PHURINE 6.0 05/22/2017 1300   GLUCOSEU NEGATIVE 05/22/2017 1300   HGBUR SMALL (A) 05/22/2017 1300   BILIRUBINUR NEGATIVE 05/22/2017 1300   KETONESUR NEGATIVE 05/22/2017 1300   PROTEINUR NEGATIVE 05/22/2017 1300   UROBILINOGEN 0.2 02/01/2014 0454   NITRITE NEGATIVE 05/22/2017 1300   LEUKOCYTESUR SMALL (A) 05/22/2017 1300   Sepsis Labs: @LABRCNTIP (procalcitonin:4,lacticidven:4) ) Recent Results (from the past 240 hour(s))  Culture, blood (Routine X 2) w Reflex to ID Panel     Status: Abnormal   Collection Time: 05/22/17 10:41 AM  Result Value Ref Range Status   Specimen Description BLOOD LEFT ARM  Final   Special Requests IN PEDIATRIC BOTTLE Blood Culture adequate volume  Final   Culture  Setup Time   Final    GRAM POSITIVE COCCI IN CLUSTERS IN PEDIATRIC BOTTLE CRITICAL RESULT CALLED TO, READ BACK BY AND VERIFIED WITH: M LILLISTON,PHARMD AT 1453 05/23/17 BY L BENFIELD    Culture (A)  Final    STAPHYLOCOCCUS SPECIES (COAGULASE NEGATIVE) THE SIGNIFICANCE OF ISOLATING THIS ORGANISM FROM A SINGLE SET OF BLOOD CULTURES WHEN MULTIPLE SETS ARE DRAWN IS UNCERTAIN. PLEASE NOTIFY THE MICROBIOLOGY DEPARTMENT WITHIN ONE WEEK IF SPECIATION AND SENSITIVITIES ARE REQUIRED. Performed at [facility], [place] [address] [place], [place] [address]    Report Status 05/25/2017 FINAL  Final  Culture, blood (Routine X 2) w Reflex to ID Panel     Status: None   Collection Time: 05/22/17 10:41 AM  Result Value Ref Range Status   Specimen Description BLOOD LEFT HAND  Final   Special Requests AEROBIC BOTTLE ONLY Blood Culture adequate volume  Final   Culture   Final    NO GROWTH 5 DAYS Performed at [facility], [place] [address] [place], [place] [address]    Report Status 05/27/2017 FINAL  Final  Blood Culture ID Panel (Reflexed)     Status: Abnormal   Collection Time: 05/22/17 10:41 AM  Result Value Ref Range Status   Enterococcus species NOT DETECTED NOT DETECTED Final    Listeria monocytogenes NOT DETECTED NOT DETECTED Final   Staphylococcus species DETECTED (A) NOT DETECTED Final    Comment: Methicillin (oxacillin) resistant coagulase negative staphylococcus. Possible blood culture contaminant (unless isolated from more than one blood culture draw or clinical case suggests pathogenicity). No antibiotic treatment is indicated for blood  culture contaminants. CRITICAL RESULT CALLED TO, READ BACK BY AND VERIFIED WITH: M LILLISTON,PHARMD AT 1452 05/23/17 BY L BENFIELD    Staphylococcus aureus NOT DETECTED NOT DETECTED Final   Methicillin resistance DETECTED (A) NOT DETECTED Final    Comment: CRITICAL RESULT CALLED TO, READ BACK BY AND VERIFIED WITH: M LILLISTON,PHARMD AT 1452 05/23/17 BY L  BENFIELD    Streptococcus species NOT DETECTED NOT DETECTED Final   Streptococcus agalactiae NOT DETECTED NOT DETECTED Final   Streptococcus pneumoniae NOT DETECTED NOT DETECTED Final   Streptococcus pyogenes NOT DETECTED NOT DETECTED Final   Acinetobacter baumannii NOT DETECTED NOT DETECTED Final   Enterobacteriaceae species NOT DETECTED NOT DETECTED Final   Enterobacter cloacae complex NOT DETECTED NOT DETECTED Final   Escherichia coli NOT DETECTED NOT DETECTED Final   Klebsiella oxytoca NOT DETECTED NOT DETECTED Final   Klebsiella pneumoniae NOT DETECTED NOT DETECTED Final   Proteus species NOT DETECTED NOT DETECTED Final   Serratia marcescens NOT DETECTED NOT DETECTED Final   Haemophilus influenzae NOT DETECTED NOT DETECTED Final   Neisseria meningitidis NOT DETECTED NOT DETECTED Final   Pseudomonas aeruginosa NOT DETECTED NOT DETECTED Final   Candida albicans NOT DETECTED NOT DETECTED Final   Candida glabrata NOT DETECTED NOT DETECTED Final   Candida krusei NOT DETECTED NOT DETECTED Final   Candida parapsilosis NOT DETECTED NOT DETECTED Final   Candida tropicalis NOT DETECTED NOT DETECTED Final    Comment: Performed at [facility], [place] [address]  [place], [place] [address]  Culture, respiratory (NON-Expectorated)     Status: None   Collection Time: 05/22/17 12:00 PM  Result Value Ref Range Status   Specimen Description BRONCHIAL ALVEOLAR LAVAGE LEFT  Final   Special Requests NONE  Final   Gram Stain   Final    RARE WBC PRESENT,BOTH PMN AND MONONUCLEAR NO ORGANISMS SEEN Performed at [facility], [place] [address], [place], [place] [address]    Culture RARE STAPHYLOCOCCUS AUREUS  Final   Report Status 05/25/2017 FINAL  Final   Organism ID, Bacteria STAPHYLOCOCCUS AUREUS  Final      Susceptibility   Staphylococcus aureus - MIC*    CIPROFLOXACIN <=0.5 SENSITIVE Sensitive     ERYTHROMYCIN <=0.25 SENSITIVE Sensitive     GENTAMICIN <=0.5 SENSITIVE Sensitive     OXACILLIN <=0.25 SENSITIVE Sensitive     TETRACYCLINE <=1 SENSITIVE Sensitive     VANCOMYCIN <=0.5 SENSITIVE Sensitive     TRIMETH/SULFA <=10 SENSITIVE Sensitive     CLINDAMYCIN <=0.25 SENSITIVE Sensitive     RIFAMPIN <=0.5 SENSITIVE Sensitive     Inducible Clindamycin NEGATIVE Sensitive     * RARE STAPHYLOCOCCUS AUREUS  Body fluid culture     Status: None (Preliminary result)   Collection Time: 05/30/17 12:16 PM  Result Value Ref Range Status   Specimen Description FLUID ASPIRATE  Final   Special Requests SMALL PERIHEPATIC FLUID  Final   Gram Stain   Final    FEW WBC PRESENT, PREDOMINANTLY PMN NO ORGANISMS SEEN    Culture   Final    NO GROWTH 1 DAY Performed at [facility], [place] [address] [place], [place] [address]    Report Status PENDING  Incomplete     Scheduled Meds: . chlorhexidine  15 mL Mouth Rinse BID  . Chlorhexidine Gluconate Cloth  6 each Topical Q1200  . HYDROmorphone   Intravenous Q4H  . insulin aspart  0-15 Units Subcutaneous Q6H  . ipratropium-albuterol  3 mL Nebulization BID  . lip balm  1 application Topical BID  . LORazepam  2 mg Oral Q6H  . mouth rinse  15 mL Mouth Rinse q12n4p  . nicotine  21 mg Transdermal Daily  . oxyCODONE  10  mg Per Tube Q4H  . pantoprazole (PROTONIX) IV  40 mg Intravenous Q12H  . thiamine injection  100 mg Intravenous Daily   Continuous Infusions: . ceFEPime (MAXIPIME) IV 2 g (06/01/17 0623)  . dextrose 5 % and 0.45% NaCl 10 mL/hr at 05/31/17 0600  . Mar[place] KitchenTPN (CLINIMIX-E) Adult     And  . fat emulsion    . metronidazole Stopped (06/01/17 0722)  . Mar[place] KitchenTPN (CLINIMIX-E) Adult 120 mL/hr at 05/31/17 1733    Procedures/Studies: Dg Chest 1 View  Result Date: 05/20/2017 CLINICAL DATA:  Postoperative desaturation. EXAM: CHEST 1 VIEW COMPARISON:  Portable chest x-ray of May 20, 2017 FINDINGS: The lungs are adequately inflated. There is persistent increased density in the retrocardiac region on the left with obscuration of the hemidiaphragm and blunting of the lateral costophrenic angle. On the right there is patchy increased density just above the hemidiaphragm which is stable. There is no pneumothorax. The heart and pulmonary vascularity are normal. There is calcification wall of the aortic arch. The endotracheal tube lies approximately 12 mm above the carina. The esophagogastric tube's proximal port lies at or just below the GE junction with the tip projecting below the inferior margin of the image. The PICC line tip projects over the midportion of the SVC. IMPRESSION: Persistent low positioning of the endotracheal tube. Withdrawal by 2 cm is recommended in an effort to avoid accidental mainstem bronchus intubation with patient movement. Persistent bibasilar atelectasis or pneumonia. Small left pleural effusion. Thoracic aortic atherosclerosis. Electronically Signed   By: Laurren Lepkowski  [place] M.D.   On: 05/20/2017 13:31   Ct Abdomen Pelvis W Contrast  Result Date: 05/29/2017 CLINICAL DATA:  Status post abdominal wound washout, wound closure and wound VAC placement on July 25th. Abdominal pain and fever. Abscess suspected. EXAM: CT ABDOMEN AND PELVIS WITH CONTRAST TECHNIQUE: Multidetector CT imaging of the abdomen and  pelvis was performed using the standard protocol following bolus administration of intravenous contrast. CONTRAST:  67mL ISOVUE-300 IOPAMIDOL (ISOVUE-300) INJECTION 61%, 135mL ISOVUE-300 IOPAMIDOL (ISOVUE-300) INJECTION 61% COMPARISON:  None. FINDINGS: Lower chest: Small right pleural effusion. Mild bibasilar atelectasis. Hepatobiliary: No focal liver abnormality is seen. Gallbladder is mildly distended but otherwise unremarkable. No bile duct dilatation seen. Pancreas: Unremarkable. No pancreatic ductal dilatation or surrounding inflammatory changes. Spleen: Normal in size without focal abnormality. Adrenals/Urinary Tract: Status post right adrenalectomy. Fluid collection in the right adrenalectomy bed has decreased in size, now measuring approximately 4 x 2 cm (previously 5 x 4 cm). Left adrenal gland appears normal. Kidneys are unremarkable without mass or hydronephrosis. Small amount of air in the bladder, likely related to recent catheterization. Bladder otherwise unremarkable. Stomach/Bowel: Status post partial colectomy with surgical bowel anastomoses in the right upper quadrant. Two percutaneous jejunostomy tubes in place, grossly appropriate in position. Additional percutaneous drainage catheter in place with tip projecting across the midline into the anterior left abdomen. No dilated large or small bowel loops.  Enteric tube in the stomach. Vascular/Lymphatic: Aortic atherosclerosis. No acute appearing vascular abnormality. Scattered small and mildly prominent lymph nodes within the abdomen and pelvis, likely reactive in nature. Reproductive: Prostate is unremarkable. Other: Small amount of free fluid in the abdomen and pelvis. Probable thin abscess collection along the anterior and lateral aspects of the liver, measuring approximately 2 cm greatest thickness, extending for approximately 20 cm along the anterolateral aspects of the liver. No free intraperitoneal air seen. Expected foci of extraperitoneal  air seen along the right abdominal wall surgical defect. Musculoskeletal: No acute or suspicious osseous finding. IMPRESSION: 1. Thick-walled fluid collection along the anterior and lateral aspects of the liver. The collection  is thin measuring approximately 2 cm greatest thickness (measured on the coronal reconstructions series 5, image 24) but extends for approximately 20 cm along the anterior and lateral margins of the liver (axial series 2, image 22). This is a presumed abscess collection. 2. No evidence of additional abscess collection within the abdomen or pelvis. 3. Small free fluid in the abdomen and pelvis. 4. Nonobstructive bowel gas pattern. Surgical changes of partial bowel resection. No evidence of surgical complicating feature seen. 5. Small right pleural effusion. Small consolidations at each lung base are most likely atelectasis. Pneumonia or aspiration pneumonitis are possible but considered less likely. 6. Support apparatus appears appropriately positioned. 7. Decrease in size of the fluid collection at the right adrenalectomy bed, now measuring 4 x 2 cm (previously 5 x 4 cm). 8. Aortic atherosclerosis. These results will be called to the ordering clinician or representative by the Radiologist Assistant, and communication documented in the PACS or zVision Dashboard. Electronically Signed   By: Franki Cabot M.D.   On: 05/29/2017 17:15   Ct Abdomen Pelvis W Contrast  Result Date: 05/13/2017 CLINICAL DATA:  Inpatient. Ileus. Severe abdominal pain. Status post laparoscopic converted to open right adrenalectomy on 80/99/8338 complicated by small bowel injury requiring small bowel resection with dense adhesions throughout the abdomen. Prior appendectomy and right hemicolectomy. EXAM: CT ABDOMEN AND PELVIS WITH CONTRAST TECHNIQUE: Multidetector CT imaging of the abdomen and pelvis was performed using the standard protocol following bolus administration of intravenous contrast. CONTRAST:  128mL  ISOVUE-300 IOPAMIDOL (ISOVUE-300) INJECTION 61% COMPARISON:  05/11/2017 abdominal radiographs. 09/15/2015 CT abdomen/pelvis. 12/16/2016 chest CT. FINDINGS: Lower chest: Small dependent right pleural effusion. Patchy consolidation and ground-glass attenuation throughout both lung bases with some volume loss. Hepatobiliary: Normal liver size. No discrete liver mass. Mild heterogeneity in the anterior inferior right liver lobe near the gallbladder fossa may be artifactual versus mild postsurgical change. Normal gallbladder with no radiopaque cholelithiasis. No biliary ductal dilatation. Pancreas: Normal, with no mass or duct dilation. Spleen: Normal size. No mass. Adrenals/Urinary Tract: Status post right adrenalectomy. Ill-defined 5.1 x 3.8 cm simple appearing fluid collection in the right adrenalectomy bed (series 2/ image 36) without internal gas or associated wall thickening. No left adrenal nodules. No hydronephrosis. No renal masses. Stomach/Bowel: Distended fluid-filled stomach with no appreciable gastric wall thickening or pneumatosis. There is diffuse dilatation of the small bowel up to 5.6 cm diameter, with fluid levels throughout the small bowel. Surgical sutures are noted in the right abdomen from enteroenterostomy sites. No discrete small bowel caliber transition site. No small bowel wall thickening. Status post right hemicolectomy with intact appearing ileocolic anastomosis. Mild diffuse dilatation of the large bowel with fluid levels throughout the large bowel. No large bowel wall thickening. Vascular/Lymphatic: Atherosclerotic nonaneurysmal abdominal aorta. Patent portal, splenic, hepatic and renal veins. No pathologically enlarged lymph nodes in the abdomen or pelvis. Reproductive: Top-normal size prostate with nonspecific internal prostatic calcifications. Other: Pneumoperitoneum under the right hemidiaphragm and in the ventral right abdominal wall. No additional focal fluid collections. Open wound in  the ventral right upper abdominal wall. Mild dependent superficial edema in the low back and lateral gluteal regions. Musculoskeletal: No aggressive appearing focal osseous lesions. IMPRESSION: 1. Diffuse dilatation of the small greater the large bowel with fluid levels throughout the small and large bowel, most compatible with adynamic ileus in the postoperative state. No bowel wall thickening. No discrete small bowel caliber transition to suggest obstruction. 2. Pneumoperitoneum in the right upper quadrant is presumably due  to recent surgery . 3. Small postoperative fluid collection at the right adrenalectomy site without wall thickening or internal gas. 4. Small dependent right pleural effusion. 5. Patchy bibasilar lung consolidation and ground-glass attenuation with some volume loss, concerning for multilobar pneumonia and/or aspiration with some atelectasis. Electronically Signed   By: Ilona Sorrel M.D.   On: 05/13/2017 18:01   US Aspiration  Result Date: 05/30/2017 INDICATION: 56 year old male status post complicated right adrenalectomy with small bowel injury, resection and anastomotic leak necessitating re- operative intervention. He currently has a thin rim of peripherally enhancing fluid in the perihepatic space and presents for ultrasound-guided aspiration. EXAM: Ultrasound-guided aspiration MEDICATIONS: The patient is currently admitted to the hospital and receiving intravenous antibiotics. The antibiotics were administered within an appropriate time frame prior to the initiation of the procedure. ANESTHESIA/SEDATION: None COMPLICATIONS: None immediate. PROCEDURE: Informed written consent was obtained from the patient after a thorough discussion of the procedural risks, benefits and alternatives. All questions were addressed. A timeout was performed prior to the initiation of the procedure. Ultrasound was used to interrogate the right upper quadrant. There is a very thin rim of heterogeneous fluid in  the perihepatic space. The fluid is a heterogeneous mix of hypoechoic and echogenic material. A suitable skin entry site was selected and marked. Local anesthesia was attained by infiltration with 1% lidocaine. A small dermatotomy was made. Under real-time sonographic guidance, a 5 [place] Yueh centesis catheter was advanced into the fluid collection. Aspiration yielded a minimal amount of bloody fluid. No gross purulence. IMPRESSION: 1. Ultrasound reveals a thin rim of highly complex fluid in the perihepatic space. 2. Ultrasound aspiration yields minimal bloody fluid. This fluid collection likely represents mild perihepatic hematoma. The aspirated sample will be sent for culture. Electronically Signed   By: Jacqulynn Cadet M.D.   On: 05/30/2017 12:33   Dg Chest Port 1 View  Result Date: 05/30/2017 CLINICAL DATA:  History of total adrenalectomy. EXAM: PORTABLE CHEST 1 VIEW COMPARISON:  Prior radiograph from 05/29/2017. FINDINGS: Enteric tube courses in the abdomen. Cardiac and mediastinal silhouettes are stable. Aortic atherosclerosis. Right PICC catheter terminates at the cavoatrial junction. Elevation the right hemidiaphragm with associated right basilar opacity, atelectasis or infiltrate. Mild left basilar subsegmental atelectasis. No appreciable pleural effusion. Perihilar vascular congestion without pulmonary edema. No pneumothorax. No acute osseus abnormality.  Cervical ACDF noted. IMPRESSION: 1. Elevation of the right hemidiaphragm with similar right basilar opacity, either atelectasis or infiltrate. Mild left basilar subsegmental atelectasis. 2. Stable position of right PICC catheter. Electronically Signed   By: Jeannine Boga M.D.   On: 05/30/2017 06:34   Dg Chest Port 1 View  Result Date: 05/29/2017 CLINICAL DATA:  Respiratory failure. EXAM: PORTABLE CHEST 1 VIEW COMPARISON:  05/28/2017. FINDINGS: Interim extubation. NG tube and right PICC line in stable position. Heart size normal. Stable  right base atelectasis and infiltrate. Small left pleural effusion. Cervical spine fusion. IMPRESSION: 1. Interim extubation. NG tube and right PICC line in stable position. 2. Stable right base atelectasis/infiltrate. Mild left base subsegmental atelectasis. Electronically Signed   By: Marcello Moores  Register   On: 05/29/2017 06:40   Dg Chest Port 1 View  Result Date: 05/28/2017 CLINICAL DATA:  Intubated patient, respiratory failure patient's proximally 10 days post laparotomy and subsequent abdominal wound clean out. EXAM: PORTABLE CHEST 1 VIEW COMPARISON:  Portable chest x-ray of May 27, 2017 FINDINGS: The left lung is well-expanded. Minimal increased density is present in the retrocardiac region and is stable. On the  right there is persistent patchy density in the lower lung with partial obscuration of the hemidiaphragm. There is no pneumothorax or large pleural effusion. The heart is normal in size. The pulmonary vascularity is not engorged. There is calcification in the wall of the aortic arch. The endotracheal tube tip lies 5.2 cm above the carina. The esophagogastric tube has its proximal port at or just above the GE junction with the tip in the region of the gastric cardia below the inferior margin of the image. The right PICC line tip projects over the junction of middle and distal thirds of the SVC. IMPRESSION: There is persistent right basilar atelectasis or pneumonia little changed from yesterday's study. Subtle density at the left lung base medially is stable and compatible with atelectasis as well. Advancement of the esophagogastric tube by 10 cm is recommended to assure that the proximal port rib is positioned below the GE junction. Appropriate positioning of the esophagogastric tube. Thoracic aortic atherosclerosis. Electronically Signed   By: Bryton Romagnoli  [place] M.D.   On: 05/28/2017 07:11   Dg Chest Port 1 View  Result Date: 05/27/2017 CLINICAL DATA:  Respiratory failure . EXAM: PORTABLE CHEST 1 VIEW  COMPARISON:  05/26/2017 . FINDINGS: Endotracheal tube, NG tube, right PICC line in stable position . Heart size normal. Bibasilar atelectasis and infiltrates. No pleural effusion or pneumothorax . Cervical spine fusion. IMPRESSION: 1.  Lines and tubes in stable position. 2. Bibasilar atelectasis and infiltrates again noted. No change from prior exam. Electronically Signed   By: Marcello Moores  Register   On: 05/27/2017 07:16   Dg Chest Port 1 View  Result Date: 05/26/2017 CLINICAL DATA:  Respiratory failure. EXAM: PORTABLE CHEST 1 VIEW COMPARISON:  05/25/2017 . FINDINGS: Endotracheal tube, NG tube, right PICC line in stable position. Heart size normal. Low lung volumes with bibasilar atelectasis and infiltrates. Interim improvement from prior exam. No significant pleural effusion noted on today's exam. No pneumothorax . IMPRESSION: 1. Lines and tubes in stable position. 2. Low lung volumes with persistent bibasilar atelectasis and infiltrates. Interim improvement from prior exam. Electronically Signed   By: Marcello Moores  Register   On: 05/26/2017 06:39   Dg Chest Port 1 View  Result Date: 05/25/2017 CLINICAL DATA:  Intubation. EXAM: PORTABLE CHEST 1 VIEW COMPARISON:  05/24/2017 . FINDINGS: Endotracheal tube, NG tube, right PICC line stable position. Stable cardiomegaly. Persistent unchanged bibasilar atelectasis and infiltrates noted. Small left pleural effusion cannot be excluded. No pneumothorax. IMPRESSION: 1. Lines and tubes in stable position. 2. Persistent unchanged bibasilar atelectasis and infiltrates noted. Small left pleural effusion cannot be excluded. Electronically Signed   By: Marcello Moores  Register   On: 05/25/2017 06:08   Dg Chest Port 1 View  Result Date: 05/24/2017 CLINICAL DATA:  Endotracheal tube. EXAM: PORTABLE CHEST 1 VIEW COMPARISON:  05/23/2017 FINDINGS: Endotracheal tube terminates 5.5 cm above the carina. Enteric tube terminates over the proximal stomach with side hole overlying the distal  esophagus. The cardiomediastinal silhouette is unchanged. Left basilar airspace opacities on the prior study have decreased. Right basilar opacities are unchanged. No sizable pleural effusion or pneumothorax is identified. IMPRESSION: Improved aeration of the left lung base. Unchanged right basilar infiltrates. Electronically Signed   By: Logan Bores M.D.   On: 05/24/2017 07:05   Dg Chest Port 1 View  Result Date: 05/23/2017 CLINICAL DATA:  Acute respiratory failure. EXAM: PORTABLE CHEST 1 VIEW COMPARISON:  05/22/2017 FINDINGS: Endotracheal tube terminates 3- 3.5 cm above the carina. Enteric tube courses into the left upper  abdomen with tip not imaged. The cardiomediastinal silhouette is unchanged. Left greater than right basilar lung opacities are unchanged. No large pleural effusion or pneumothorax is identified. IMPRESSION: Unchanged left greater than right basilar lung opacities which may reflect pneumonia. Electronically Signed   By: Logan Bores M.D.   On: 05/23/2017 07:30   Dg Chest Port 1 View  Result Date: 05/22/2017 CLINICAL DATA:  Acute respiratory failure, hypoxia EXAM: PORTABLE CHEST 1 VIEW COMPARISON:  05/21/2017 FINDINGS: Interval retraction of the endotracheal tube with the tip now 4 cm above the carina. NG tube enters the stomach. Bilateral perihilar and lower lobe airspace opacities are similar to prior study. Mild cardiomegaly. IMPRESSION: Interval repositioning of endotracheal tube, now 4 cm above the carina. Continued bilateral perihilar and lower lobe airspace opacities, left greater than right. Electronically Signed   By: Rolm Baptise M.D.   On: 05/22/2017 07:10   Dg Chest Port 1 View  Result Date: 05/21/2017 CLINICAL DATA:  Acute respiratory failure with hypoxia. EXAM: PORTABLE CHEST 1 VIEW COMPARISON:  Radiograph May 20, 2017. FINDINGS: Stable cardiomediastinal silhouette. Atherosclerosis of thoracic aorta is noted. No pneumothorax is noted. Right-sided PICC line is unchanged  in position. Stable bibasilar atelectasis is noted. Nasogastric tube is seen entering stomach. Endotracheal tube appears to be slightly directed into right mainstem bronchus. IMPRESSION: Aortic atherosclerosis. Stable bibasilar subsegmental atelectasis. Endotracheal tube appears to be slightly directed into right mainstem bronchus, and withdrawal by 3 cm is recommended. These results will be called to the ordering clinician or representative by the Radiologist Assistant, and communication documented in the PACS or zVision Dashboard. Electronically Signed   By: Marijo Conception, M.D.   On: 05/21/2017 07:32   Dg Chest Port 1 View  Result Date: 05/20/2017 CLINICAL DATA:  Acute respiratory failure. EXAM: PORTABLE CHEST 1 VIEW COMPARISON:  May 18, 2017 FINDINGS: The ETT terminates 11 mm above the carina. Recommend withdrawing 1 cm. The NG tube terminates in the left upper quadrant. A right PICC line terminates in the central SVC. No pneumothorax. Bibasilar opacities and possible small effusions are stable. No other interval changes. IMPRESSION: 1. The ETT terminates 11 mm above the carina. Recommend withdrawing 1 cm. 2. Stable bibasilar opacities. Possible small effusions. No other changes. Electronically Signed   By: Dorise Bullion III M.D   On: 05/20/2017 07:19   Dg Chest Port 1 View  Result Date: 05/18/2017 CLINICAL DATA:  Respiratory failure. EXAM: PORTABLE CHEST 1 VIEW COMPARISON:  05/17/2017 . FINDINGS: Endotracheal tube, NG tube, right PICC line in stable position. Heart size stable . Improved left upper lobe infiltrate/edema . Persistent diffuse bilateral pulmonary infiltrates/edema. Persistent left base atelectasis. Persistent small left pleural effusion. No pneumothorax. IMPRESSION: 1. Lines and tubes in stable position. 2. Improved left upper lobe infiltrate/edema. 3. Persistent bibasilar pulmonary infiltrates/edema. Persistent left base atelectasis. Persistent small left pleural effusion .  Electronically Signed   By: Marcello Moores  Register   On: 05/18/2017 06:48   Dg Chest Port 1 View  Result Date: 05/17/2017 CLINICAL DATA:  Acute respiratory failure with hypoxemia EXAM: PORTABLE CHEST 1 VIEW COMPARISON:  05/16/2017 FINDINGS: Endotracheal tube in good position. NG tube in the stomach. PICC tip in the SVC. Progression of left upper lobe airspace disease with new airspace disease left upper lobe. Right lower lobe airspace disease unchanged. Progression of left upper lobe airspace disease since the prior study. IMPRESSION: Progression of left lower lobe and left upper lobe airspace disease. No change right lower lobe airspace disease.  Electronically Signed   By: Franchot Gallo M.D.   On: 05/17/2017 07:00   Dg Chest Port 1 View  Result Date: 05/16/2017 CLINICAL DATA:  Respiratory failure EXAM: PORTABLE CHEST 1 VIEW COMPARISON:  05/15/2017 FINDINGS: Endotracheal tube 3 cm above the carina. NG tube in the stomach. Interval placement of right arm PICC tip in the SVC Bibasilar airspace disease unchanged. Negative for heart failure or effusion IMPRESSION: PICC tip in the SVC. Bibasilar airspace disease unchanged. Electronically Signed   By: Franchot Gallo M.D.   On: 05/16/2017 07:25   Dg Chest Port 1 View  Result Date: 05/15/2017 CLINICAL DATA:  Respiratory failure.  Hypoxia. EXAM: PORTABLE CHEST 1 VIEW COMPARISON:  05/14/2017 . FINDINGS: Endotracheal tube is been repositioned, its tip is 3.2 cm above the carina. NG tube noted with tip coiled in stomach. Heart size normal. Diffuse bilateral pulmonary infiltrates again noted. Left pulmonary infiltrate has improved prior exam. No prominent pleural effusion. No pneumothorax . IMPRESSION: 1. Interim endotracheal tube repositioning with its tip now 3.2 cm above the carina. NG tube noted with tip coiled in stomach. 2. Persistent bilateral pulmonary infiltrates. Left pulmonary infiltrate has improved from prior exam . 3. Stable cardiomegaly . Electronically  Signed   By: Marcello Moores  Register   On: 05/15/2017 07:11   Dg Chest Port 1 View  Result Date: 05/14/2017 CLINICAL DATA:  Acute respiratory failure EXAM: PORTABLE CHEST 1 VIEW COMPARISON:  Yesterday FINDINGS: Endotracheal tube tip is shorter than before, now at the level the clavicular heads. An orogastric tube is no longer seen. Worsening inflation. Diffuse interstitial and airspace opacity, greater on the left where there could be layering fluid. No pneumothorax. Grossly normal heart size. IMPRESSION: 1. Higher endotracheal tube, tip at the clavicular heads. An orogastric tube is no longer seen. 2. Lower lung volumes. Diffuse airspace disease suspicious for ARDS or multifocal infection/aspiration. Electronically Signed   By: Monte Fantasia M.D.   On: 05/14/2017 08:59   Dg Chest Port 1 View  Result Date: 05/14/2017 CLINICAL DATA:  Acute respiratory failure with hypoxemia, current smoker. EXAM: PORTABLE CHEST 1 VIEW COMPARISON:  05/11/2017 FINDINGS: Heart size is top normal with aortic atherosclerosis. Endotracheal tube tip is seen 3.4 cm above the carina. A gastric tube is noted in the expected location of the stomach with tip in the region of the gastric antrum. Interstitial edema is noted with central vascular congestion. Mild prominence of the right hilum is again noted which may reflect central vascular congestion and/or adenopathy. Superimposed pneumonia is not entirely excluded. Resolution of free intraperitoneal air beneath the right hemidiaphragm on current study. No acute nor suspicious osseous abnormality. IMPRESSION: 1. Satisfactory support line and tube positions. 2. Bilateral diffuse interstitial edema is again noted with slight improvement in aeration at the right lung base and resolution of pneumoperitoneum. 3. Right hilar prominence is seen similar to prior and may reflect adenopathy, vascular summation or possibly superimposed pneumonia. Electronically Signed   By: Ashley Royalty M.D.   On:  05/14/2017 03:58   Dg Chest Port 1 View  Result Date: 05/09/2017 CLINICAL DATA:  56 year old male with history of shortness of breath. History of prior inguinal hernia repair. EXAM: PORTABLE CHEST 1 VIEW COMPARISON:  Chest x-ray 05/07/2017. FINDINGS: Elevated right hemidiaphragm with evidence of pneumoperitoneum beneath the right hemidiaphragm. Opacities at the right lung base likely reflect subsegmental atelectasis, although underlying airspace consolidation is not excluded. Patchy areas of interstitial prominence are noted in the lungs bilaterally, most significant in the  right mid to upper lung, concerning for potential developing bronchopneumonia. Small right pleural effusion. No left pleural effusion. No evidence of pulmonary edema. Heart size is normal. Upper mediastinal contours are within normal limits. Aortic atherosclerosis. Orthopedic fixation hardware in the lower cervical spine. IMPRESSION: 1. Findings are concerning for developing multilobar bronchopneumonia. 2. Worsening atelectasis and/or consolidation in the right lower lobe. Small right pleural effusion. 3. Persistent pneumoperitoneum beneath the right hemidiaphragm, presumably iatrogenic related to recent surgery. Electronically Signed   By: Vinnie Langton M.D.   On: 05/09/2017 23:48   Dg Chest Port 1 View  Result Date: 05/07/2017 CLINICAL DATA:  Rapid respiration. Abdominal distension and shortness of breath. Laparoscopic adrenalectomy and small bowel resection with reanastomosis May 06, 2017. EXAM: PORTABLE CHEST 1 VIEW COMPARISON:  Mar 05, 2015 FINDINGS: Free air seen under the right hemidiaphragm consistent with surgery yesterday. No pneumothorax. The cardiomediastinal silhouette is normal. Bibasilar opacities are likely atelectasis. IMPRESSION: 1. Free air under the right hemidiaphragm consistent with surgery yesterday. 2. Bibasilar opacities, likely atelectasis. Electronically Signed   By: Dorise Bullion III M.D   On: 05/07/2017  21:04   Dg Abd Acute W/chest  Result Date: 05/11/2017 CLINICAL DATA:  Shortness of breath and abdominal pain EXAM: DG ABDOMEN ACUTE W/ 1V CHEST COMPARISON:  Chest x-ray from 2 days pper FINDINGS: Progressive diffuse airspace opacity. Lung volumes are low. Prominent heart size, accentuated by technique. No pneumothorax. Recent abdominal surgery with similar appearing pneumoperitoneum. Diffuse colonic, gastric, and small bowel dilatation consistent with an ileus in this setting. Surgical clips and bowel sutures. IMPRESSION: 1. Progressive bilateral airspace disease which could be edema (likely noncardiogenic), infection, or aspiration. 2. Ileus pattern. Electronically Signed   By: Monte Fantasia M.D.   On: 05/11/2017 08:41    Ladonne Sharples, DO  [facility] Pager [phone]  If 7PM-7AM, please contact night-coverage [email or web address] Password [email or web address] 06/01/2017, 8:12 AM   LOS: 26 days

## 2017-06-01 NOTE — Progress Notes (Signed)
[facility] NOTE   Pharmacy Consult for TPN Indication: prolonged ileus, small bowel leak, multiple bowel surgeries  Patient Measurements: Body mass index is 29.51 kg/m. Filed Weights   05/21/17 0555 05/21/17 1111 05/26/17 0430  Weight: 208 lb 8.9 oz (94.6 kg) 208 lb 8.9 oz (94.6 kg) 205 lb 11 oz (93.3 kg)   HPI: 75 yoM admitted on 7/11 for enlarging adrenal mass and planned adrenalectomy.  Pharmacy consulted to dose TPN.  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   7/22 OR:  wash out, tube ileostomy, left with open abdomen & wound vac in place.  Plan for OR on 7/25 for closure 7/25 OR: mesh placed for abdominal closure, wound vac 8/2 extubated, propofol drip off 8/4 TPN off for ~ 5 hours due to line detached from filter  Insulin requirements past 24 hours: 4 units SSI (no hx DM)  Current Nutrition: NPO  IVF: D5 1/2NS at10 ml/hr  Central access: PICC line ordered 7/19, placed on 7/20 TPN start date: 7/20  ASSESSMENT                                                                                                           Today, 06/01/17 - Glucose: at goal (goal < 150) - Electrolytes: WNL x Na 134.  - Renal:  SCr wnl, stable. I/O: 1800/1830, NGO 950, drains 580 - Weight: most recent 7/31 93.3kg down from 94.6kg on 7/26 - GI: VAC removed, drains in, blake w/ thick brown liquid - LFTs: WNL x alk phos 269, trending up;  Tbili WNL - TGs:  296 (7/18), 266 (7/20), 269 (7/23), 210 (7/28), 285 (7/31) 301 (8/3)   - Prealbumin:  <5 (7/20), 6.1 (7/24), 10.1 (7/30)  NUTRITIONAL  GOALS                                                                                             RD recs (8/3):  134 - 152 gms Protein (1.5 - 1.7 g/kg) 2235 - 2505 Kcal  Clinimix 5/15 at a max fluid volume of 3L at 120 ml/hr  + 20% fat emulsion 240 ml/day to provide: 144 g of protein and 2568 kCals per day - Glucose infusion rate will be 3.2 mg/kg/min (Maximum 5 mg/kg/min)   PLAN  At 1800 today:  Continue Clinimix  E 5/15 at 141m/hr and continue lipids today at 244m/hr x 12 hours to provide 144 gm protein and 2525 Kcal for 100 % support  TPN to contain standard multivitamins and trace elements daily  [facility] q[facility].  TPN lab panels on Mondays & Thursdays.  Monitor I/O with high volume TPN  MiEudelia BunchPharm.D. [id number]/03/2017 9:22 AM

## 2017-06-02 LAB — GLUCOSE, CAPILLARY
GLUCOSE-CAPILLARY: 127 mg/dL — AB (ref 65–99)
GLUCOSE-CAPILLARY: 123 mg/dL — AB (ref 65–99)
Glucose-Capillary: 123 mg/dL — ABNORMAL HIGH (ref 65–99)
Glucose-Capillary: 124 mg/dL — ABNORMAL HIGH (ref 65–99)

## 2017-06-02 LAB — BASIC METABOLIC PANEL
ANION GAP: 10 (ref 5–15)
BUN: 22 mg/dL — ABNORMAL HIGH (ref 6–20)
CALCIUM: 8.9 mg/dL (ref 8.9–10.3)
CO2: 22 mmol/L (ref 22–32)
CREATININE: 0.66 mg/dL (ref 0.61–1.24)
Chloride: 100 mmol/L — ABNORMAL LOW (ref 101–111)
Glucose, Bld: 109 mg/dL — ABNORMAL HIGH (ref 65–99)
Potassium: 4.7 mmol/L (ref 3.5–5.1)
SODIUM: 132 mmol/L — AB (ref 135–145)

## 2017-06-02 LAB — CBC WITH DIFFERENTIAL/PLATELET
BASOS ABS: 0.1 10*3/uL (ref 0.0–0.1)
Basophils Relative: 1 %
EOS ABS: 0.7 10*3/uL (ref 0.0–0.7)
Eosinophils Relative: 4 %
HEMATOCRIT: 26.1 % — AB (ref 39.0–52.0)
Hemoglobin: 8.9 g/dL — ABNORMAL LOW (ref 13.0–17.0)
Lymphocytes Relative: 22 %
Lymphs Abs: 3.3 10*3/uL (ref 0.7–4.0)
MCH: 27.1 pg (ref 26.0–34.0)
MCHC: 34.1 g/dL (ref 30.0–36.0)
MCV: 79.3 fL (ref 78.0–100.0)
MONO ABS: 1.3 10*3/uL — AB (ref 0.1–1.0)
MONOS PCT: 9 %
NEUTROS ABS: 9.9 10*3/uL — AB (ref 1.7–7.7)
NEUTROS PCT: 64 %
Platelets: 898 10*3/uL — ABNORMAL HIGH (ref 150–400)
RBC: 3.29 MIL/uL — ABNORMAL LOW (ref 4.22–5.81)
RDW: 16.6 % — AB (ref 11.5–15.5)
WBC: 15.3 10*3/uL — ABNORMAL HIGH (ref 4.0–10.5)

## 2017-06-02 LAB — PATHOLOGIST SMEAR REVIEW

## 2017-06-02 MED ORDER — TRACE MINERALS CR-CU-MN-SE-ZN 10-1000-500-60 MCG/ML IV SOLN
INTRAVENOUS | Status: AC
  Administered 2017-06-02: 17:00:00 via INTRAVENOUS
  Filled 2017-06-02: qty 2880

## 2017-06-02 MED ORDER — FAT EMULSION 20 % IV EMUL
240.0000 mL | INTRAVENOUS | Status: AC
  Administered 2017-06-02: 240 mL via INTRAVENOUS
  Filled 2017-06-02: qty 250

## 2017-06-02 NOTE — Progress Notes (Signed)
Nutrition Follow-up  DOCUMENTATION CODES:   Not applicable  INTERVENTION:  - Continue TPN per Pharmacy.  ? Clinimix  E 5/15 at 115m/hr and continue lipids today at 269m/hr x 12 hours to provide 144 gm protein and 2525 Kcal for 100 % support ? TPN to contain standard multivitamins and trace elements daily ? [facility] and moderate SSI q6h. ? TPN lab panels on Mondays & Thursdays. ? Monitor I/O with high volume TPN  NUTRITION DIAGNOSIS:   Inadequate oral intake related to inability to eat as evidenced by NPO status. 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 On 7/18, he developed a leak therefore was taken back to OR for re-do of ex-lap and repair of leak.   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   7/22 OR:  wash out, tube ileostomy, left with open abdomen & wound vac in place.  Plan for OR on 7/25 for closure 7/25 OR: mesh placed for abdominal closure, wound vac 8/2 extubated, propofol drip off 8/4 TPN off for ~ 5 hours due to line detached from filter. Ultrasound-guided aspiration By interventional radiology of small perihepatic fluid collection yielded only 1 mL of the fluid, sent for culture. NPO - mild coffee  ground on NG but no active bleed 8/6, patient improving. Off Precedex drip since 8/4. Dilaudid Drip being changed over to PCA. Still with some coffee ground -looking material in NG tube.   Pt continues to be on TPN; tolerating well. Per chart, pt is weight stable since admit. NGT in place on suction.   Medications reviewed and include: hydromorphone, insulin, nicotine, oxycodone, thiamine, cefepime, metronidazole  Labs reviewed: Na 132(L), Cl 100(L), BUN 22(H), P 4.5 wml, Mg 2.1 wnl Wbc- 15.3(H), Hgb 8.9(L), Hct 26.1(L)  Diet Order:  Diet NPO time specified Except for: Ice Chips TPN (CLINIMIX-E) Adult TPN (CLINIMIX-E) Adult  Skin:  Wound (see comment) (Incisions to R flank and abdomen from 7/11 and 7/18), 7/19, 7/22, and 7/25, Stage II back  Last BM:  8/7-illeostomy   Height:   Ht Readings from Last 1 Encounters:  05/26/17 '5\' 10"'  (1.778 m)    Weight:   Wt Readings from Last 1 Encounters:  05/26/17 205 lb 11 oz (93.3 kg)    Ideal Body Weight:  75.45 kg  BMI:  Body mass index is 29.51 kg/m.  Estimated Nutritional Needs:   Kcal:  [id number] kcal/kg)  Protein:  134-152 grams (1.5-1.7 grams/kg)  Fluid:  2.4 L/day  EDUCATION NEEDS:   No education needs identified at this time  CaKoleen DistanceS, RD, LDWhite Rockager #- [phone] Hours Pager: [phone]

## 2017-06-02 NOTE — Progress Notes (Signed)
[place] NOTE   Pharmacy Consult for TPN Indication: prolonged ileus, small bowel leak, multiple bowel surgeries  Patient Measurements: Body mass index is 29.51 kg/m. Filed Weights   05/21/17 0555 05/21/17 1111 05/26/17 0430  Weight: 208 lb 8.9 oz (94.6 kg) 208 lb 8.9 oz (94.6 kg) 205 lb 11 oz (93.3 kg)   HPI: 64 yoM admitted on 7/11 for enlarging adrenal mass and planned adrenalectomy.  Pharmacy consulted to dose TPN.  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   7/22 OR:  wash out, tube ileostomy, left with open abdomen & wound vac in place.  Plan for OR on 7/25 for closure 7/25 OR: mesh placed for abdominal closure, wound vac 8/2 extubated, propofol drip off 8/4 TPN off for ~ 5 hours due to line detached from filter  Insulin requirements past 24 hours: 6 units SSI (no hx DM)  Current Nutrition: NPO  IVF: D5 1/2NS at10 ml/hr  Central access: PICC line ordered 7/19, placed on 7/20 TPN start date: 7/20  ASSESSMENT                                                                                                           Today, 06/02/17 - Glucose: at goal (goal < 150) - Electrolytes: WNL x Na 132.  - Renal:  SCr wnl, stable. I/O: +1084, NGO 850, drains 485 - Weight: most recent 7/31 93.3kg down from 94.6kg on 7/26 - GI: VAC removed, drains in, blake w/ thick brown liquid - LFTs: WNL x alk phos 269, trending up;  Tbili WNL - TGs:  296 (7/18), 266 (7/20), 269 (7/23), 210 (7/28), 285 (7/31) 301 (8/3)   - Prealbumin:  <5 (7/20), 6.1 (7/24), 10.1 (7/30) 24.8 (8/6) Prealbumin  now WNL NUTRITIONAL GOALS                                                                                             RD recs (8/3):  134 - 152 gms Protein (1.5 - 1.7 g/kg) 2235 - 2505 Kcal  Clinimix 5/15 at a max fluid volume of 3L at 120 ml/hr  + 20% fat emulsion 240 ml/day to provide: 144 g of protein and 2568 kCals per day - Glucose infusion rate will be 3.2 mg/kg/min (Maximum 5 mg/kg/min)   PLAN  At 1800 today:  Continue Clinimix  E 5/15 at 161m/hr and continue lipids today at 217m/hr x 12 hours to provide 144 gm protein and 2525 Kcal for 100 % support  TPN to contain standard multivitamins and trace elements daily  Continue [place]F at [place] moderate SSI q6h.  TPN lab panels on Mondays & Thursdays.  Monitor I/O with high volume TPN  MiEudelia BunchPharm.D. [id number]/04/2017 8:04 AM

## 2017-06-02 NOTE — Progress Notes (Signed)
Progress Note: General Surgery Service   Assessment/Plan: Patient Active Problem List   Diagnosis Date Noted  . Peritonitis ([place]) 06/01/2017  . Pressure injury of skin 05/29/2017  . Mucus plugging of bronchi   . Acute respiratory failure with hypoxemia ([place])   . Ileus ([place])   . Hypokalemia 05/14/2017  . Anastomotic leak of intestine 05/14/2017  . Severe sepsis ([place]) 05/14/2017  . Wound dehiscence, surgical 05/14/2017  . Aspiration pneumonia ([place]) 05/14/2017  . Chronic narcotic use 05/10/2017  . Anxiety state 05/10/2017  . Intra-abdominal adhesions s/p SB resection 05/06/2017 05/09/2017  . H/O total adrenalectomy ([place]) 05/06/2017  . Right adrenal mass s/p adrenalectomy 05/06/2017 05/06/2017  . Throat symptom 03/18/2016  . Multinodular goiter 01/19/2016  . Hyperthyroidism 01/19/2016  . Essential hypertension   . Diarrhea 11/30/2014  . Anemia, iron deficiency 11/01/2014  . Leukocytosis 11/03/2013  . Tobacco abuse 07/26/2013  . COPD (chronic obstructive pulmonary disease) ([place]) 07/26/2013  . Left knee pain 07/26/2013  . Pain in joint, shoulder region 05/03/2013  . GERD 07/24/2008  . 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 Up to chair yesterday  Objective: Vital signs in last 24 hours: Temp:  [97.5 F (36.4 C)-99.6 F (37.6 C)] 98.1 F (36.7 C) (08/07 1200) Pulse Rate:  [96-122] 101 (08/07 1200) Resp:  [16-41] 29 (08/07 1200) BP: (118-156)/(71-95) 128/71 (08/07 1200) SpO2:  [95 %-98 %] 95 % (08/07 1200) Last BM Date: 06/02/17 (ileostomy)  Intake/Output from previous day: 08/06 0701 - 08/07 0700 In: 3404 [I.V.:2644; NG/GT:160; IV Piggyback:600] Out: 2160  [Urine:825; Emesis/NG output:850; Drains:485] Intake/Output this shift: Total I/O In: 895 [I.V.:770; Other:10; NG/GT:115] Out: -   Lungs: CTAB  Cardiovascular: tachycardic  Abd: soft, NT, ND  Extremities: no edema  Neuro: AOx4  Lab Results: CBC   Recent Labs  06/01/17 0543 06/02/17 0413  WBC 12.6* 15.3*  HGB 10.4* 8.9*  HCT 31.3* 26.1*  PLT 742* 898*   BMET  Recent Labs  06/01/17 0543 06/02/17 0413  NA 134* 132*  K 4.3 4.7  CL 99* 100*  CO2 25 22  GLUCOSE 109* 109*  BUN 20 22*  CREATININE 0.67 0.66  CALCIUM 8.6* 8.9   PT/INR No results for input(s): LABPROT, INR in the last 72 hours. ABG No results for input(s): PHART, HCO3 in the last 72 hours.  Invalid input(s): PCO2, PO2  Studies/Results:  Anti-infectives: Anti-infectives    Start     Dose/Rate Route Frequency Ordered Stop   05/29/17 2200  ceFEPIme (MAXIPIME) 2 g in dextrose 5 % 50 mL IVPB     2 g 100 mL/hr over 30 Minutes Intravenous Every 8 hours 05/29/17 2031     05/29/17 2100  metroNIDAZOLE (FLAGYL) IVPB 500 mg     500 mg 100 mL/hr over 60 Minutes Intravenous Every 8 hours 05/29/17 2029     05/26/17 2200  ceFAZolin (ANCEF) IVPB 1 g/50 mL premix  Status:  Discontinued     1 g 100 mL/hr over 30 Minutes Intravenous Every 8 hours 05/26/17 1008 05/29/17 2023   05/25/17 1100  vancomycin (VANCOCIN) 1,250 mg in sodium chloride 0.9 % 250 mL IVPB  Status:  Discontinued     1,250 mg  166.7 mL/hr over 90 Minutes Intravenous Every 12 hours 05/25/17 0955 05/26/17 0946   05/22/17 1400  anidulafungin (ERAXIS) 100 mg in sodium chloride 0.9 % 100 mL IVPB     100 mg 78 mL/hr over 100 Minutes Intravenous Every 24 hours 05/21/17 1330 05/27/17 1740   05/21/17 1400  anidulafungin (ERAXIS) 200 mg in sodium chloride 0.9 % 200 mL IVPB     200 mg 78 mL/hr over 200 Minutes Intravenous  Once 05/21/17 1330 05/21/17 1940   05/15/17 1000  anidulafungin (ERAXIS) 100 mg in sodium chloride 0.9 % 100 mL IVPB  Status:   Discontinued     100 mg 78 mL/hr over 100 Minutes Intravenous Every 24 hours 05/14/17 0829 05/16/17 0901   05/15/17 1000  metroNIDAZOLE (FLAGYL) IVPB 500 mg  Status:  Discontinued     500 mg 100 mL/hr over 60 Minutes Intravenous Every 8 hours 05/15/17 0903 05/25/17 0938   05/14/17 0900  anidulafungin (ERAXIS) 200 mg in sodium chloride 0.9 % 200 mL IVPB     200 mg 78 mL/hr over 200 Minutes Intravenous  Once 05/14/17 0829 05/14/17 1325   05/13/17 1927  vancomycin (VANCOCIN) 1-5 GM/200ML-% IVPB    Comments:  Ward, Christa   : cabinet override      05/13/17 1927 05/14/17 0744   05/12/17 1400  ceFEPIme (MAXIPIME) 1 g in dextrose 5 % 50 mL IVPB     1 g 100 mL/hr over 30 Minutes Intravenous Every 8 hours 05/12/17 0939 05/26/17 2359   05/12/17 0900  vancomycin (VANCOCIN) IVPB 1000 mg/200 mL premix  Status:  Discontinued     1,000 mg 200 mL/hr over 60 Minutes Intravenous Every 12 hours 05/12/17 0750 05/16/17 0852   05/12/17 0830  aztreonam (AZACTAM) 2 GM IVPB     2 g 100 mL/hr over 30 Minutes Intravenous  Once 05/12/17 0800 05/12/17 0957   05/06/17 0916  vancomycin (VANCOCIN) IVPB 1000 mg/200 mL premix     1,000 mg 200 mL/hr over 60 Minutes Intravenous On call to O.R. 05/06/17 0916 05/06/17 1229      Medications: Scheduled Meds: . chlorhexidine  15 mL Mouth Rinse BID  . Chlorhexidine Gluconate Cloth  6 each Topical Q1200  . HYDROmorphone   Intravenous Q4H  . insulin aspart  0-15 Units Subcutaneous Q6H  . ipratropium-albuterol  3 mL Nebulization BID  . lip balm  1 application Topical BID  . LORazepam  2 mg Oral Q6H  . mouth rinse  15 mL Mouth Rinse q12n4p  . nicotine  21 mg Transdermal Daily  . oxyCODONE  10 mg Per Tube Q4H  . pantoprazole (PROTONIX) IV  40 mg Intravenous Q12H  . thiamine injection  100 mg Intravenous Daily   Continuous Infusions: . ceFEPime (MAXIPIME) IV Stopped (06/02/17 0531)  . dextrose 5 % and 0.45% NaCl 10 mL/hr at 06/02/17 0600  . Marland KitchenTPN (CLINIMIX-E) Adult       And  . fat emulsion    . metronidazole 500 mg (06/02/17 1322)  . Marland KitchenTPN (CLINIMIX-E) Adult 120 mL/hr at 06/02/17 0600   PRN Meds:.HYDROmorphone, labetalol, LORazepam, magic mouthwash, menthol-cetylpyridinium, naloxone **AND** sodium chloride flush, ondansetron **OR** ondansetron (ZOFRAN) IV, phenol, sodium chloride flush  Mickeal Skinner, MD Pg# [phone] [facility]

## 2017-06-02 NOTE — Evaluation (Signed)
Occupational Therapy Evaluation Patient Details Name: [name] [name] MRN: [id number] DOB: 06/24/1961 Today's Date: 06/02/2017    History of Present Illness  56 y.o. 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 On 7/18, he developed a leak therefore was taken back to OR for re-do of ex-lap and repair of leak on 05/20/17.  He returned to the ICU on the vent . Extubated on 05/28/17.   Clinical Impression   Pt was admitted for the above.  He was willing to get up and work with OT.  Pt fatiques and had delayed responses During session.  Pt was independent with adls prior to admission.  He needs assist for all adls at this time, up to total +2 assistance for LB adls.  Goals in acute are for min A UB adls, mod A LB adls and min A for toilet transfers.    Follow Up Recommendations  SNF    Equipment Recommendations  3 in 1 bedside commode    Recommendations for Other Services       Precautions / Restrictions Precautions Precautions: Fall Precaution Comments: multiple lines in abdomen, VAC and NG AND JP DRAIN Restrictions Weight Bearing Restrictions: No      Mobility Bed Mobility         Supine to sit: Mod assist;HOB elevated;+2 for safety/equipment     General bed mobility comments: +2 lines  Transfers Overall transfer level: Needs assistance Equipment used: Rolling walker (2 wheeled)   Sit to Stand: Min assist;+2 safety/equipment;From elevated surface Stand pivot transfers: Min assist;+2 safety/equipment       General transfer comment: assist to rise and steady.  Steadying assistance and cues for sequence during SPT.  3rd person managed lines as pt's knees buckled with PT on last visit.  Pt did not have any buckling today    Balance                                            ADL either performed or assessed with clinical judgement   ADL Overall ADL's : Needs assistance/impaired Eating/Feeding: NPO   Grooming: Minimal assistance;Sitting   Upper Body Bathing: Maximal assistance;Sitting   Lower Body Bathing: Total assistance;Sit to/from stand;+2 for safety/equipment   Upper Body Dressing : Moderate assistance;Sitting   Lower Body Dressing: Total assistance;+2 for physical assistance;Sit to/from stand   Toilet Transfer: Minimal assistance;+2 for physical assistance;Stand-pivot (to chair)   Toileting- Clothing Manipulation and Hygiene: Total assistance;+2 for safety/equipment;Sit to/from stand         General ADL Comments: pt agreeable to OOB.  Slow responses.  Knees did not buckle this session. Had 2 RNs assist due top multiple lines.  Pt felt SOB when sitting in chair:  sats 92-97% and HR 111.  Educated on pursed lip breathing     Vision         Perception     Praxis      Pertinent Vitals/Pain Pain Assessment: Faces Faces Pain Scale: Hurts whole lot Pain Location: abdomen Pain Descriptors / Indicators: Grimacing;Sore Pain Intervention(s): PCA encouraged     Hand Dominance     Extremity/Trunk Assessment Upper Extremity Assessment Upper Extremity Assessment:  (moved to 90 appears wfls)  Communication Communication Communication:  (DIFFICULT TO UNDERSTAND; SOFT SPOKEN)   Cognition Arousal/Alertness: Awake/alert Behavior During Therapy: Flat affect                                   General Comments: delayed responses   General Comments       Exercises     Shoulder Instructions      Home Living Family/patient expects to be discharged to:: Skilled nursing facility Living Arrangements: Spouse/significant other Available Help at Discharge: Family;Available 24 hours/day                                    Prior Functioning/Environment Level of Independence: Independent                  OT Problem List: Decreased strength;Decreased activity tolerance;Impaired balance (sitting and/or standing);Decreased cognition;Decreased knowledge of use of DME or AE;Pain;Cardiopulmonary status limiting activity      OT Treatment/Interventions: Self-care/ADL training;DME and/or AE instruction;Patient/family education;Balance training;Therapeutic activities;Cognitive remediation/compensation;Energy conservation    OT Goals(Current goals can be found in the care plan section) Acute Rehab OT Goals Patient Stated Goal: agreed to getting OOB OT Goal Formulation: With patient/family Time For Goal Achievement: 06/16/17 Potential to Achieve Goals: Good ADL Goals Pt Will Perform Grooming: standing;with min guard assist Pt Will Perform Upper Body Bathing: with min assist;sitting Pt Will Perform Lower Body Bathing: with adaptive equipment;sit to/from stand;with min assist Pt Will Perform Upper Body Dressing: with min assist;sitting Pt Will Perform Lower Body Dressing: with mod assist;sit to/from stand;with adaptive equipment Pt Will Transfer to Toilet: with min assist;ambulating;bedside commode Pt Will Perform Toileting - Clothing Manipulation and hygiene: with min assist;sit to/from stand Additional ADL Goal #1: pt will initiate at least one rest break for energy conservation Additional ADL Goal #2: pt will respond to questions/cues within 5 seconds  OT Frequency: Min 2X/week   Barriers to D/C:            Co-evaluation              AM-PAC PT "6 Clicks" Daily Activity     Outcome Measure Help from another person eating meals?: A Little Help from another person taking care of personal grooming?: A Little Help from another person toileting, which includes using toliet, bedpan, or urinal?: A Lot Help from another person bathing (including washing, rinsing, drying)?: A Lot Help from another person to put on and taking off regular upper body clothing?: A Lot Help from another  person to put on and taking off regular lower body clothing?: Total 6 Click Score: 13   End of Session    Activity Tolerance: Patient limited by fatigue Patient left: in chair;with call bell/phone within reach;with bed alarm set;with nursing/sitter in room  OT Visit Diagnosis: Unsteadiness on feet (R26.81);Muscle weakness (generalized) (M62.81)                Time: 4081-4481 OT Time Calculation (min): 28 min Charges:  OT General Charges $OT Visit: 1 Procedure OT Evaluation $OT Eval Moderate Complexity: 1 Procedure OT Treatments $Therapeutic Activity: 8-22 mins G-Codes:     [place], OTR/L [phone] 06/02/2017  Christain Niznik 06/02/2017, 2:40 PM

## 2017-06-02 NOTE — Progress Notes (Signed)
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 Patient's course was complicated by small bowel injury requiring resection with anastomoses and placement of wound VAC. On 7/18, patient developed leak and was taken back to the operating room for redo of his exploratory laparotomy and repair of leak. That time, patient returned to the ICU on the ventilator. Critical care was consulted for ventilator/medical management.  Over the next 2 weeks, patient's hospital course was complicated with the following: 07/19 - NGT pulled out and almost self-extubated. Developed cuff leak >> tube exchanged. Back to OR emergently for intestinal leak/ischemic bowel.TPN started at that time 07/22 - Back to OR for wash out >> left with open abdomen & wound vac in place. Transfused 1u PRBC. 07/25 - rising temp, WBC increased. Returned to OR for closure 07/27 - Recultured, sedation down on precedex [facility] 7/27 >> MRSE 1/2 > contaminant ? 07/30 - Remains on sedation, VAC leak over the weekend with dressing change by [facility] 07/31 - Initial attempt at weaning failed to due to tachypnea/anxiety 08/02 - Fever 101.1, WBC 14.2, weaned / extubated 8/3 - Extubated yesterday, tolerating well. O2 weaned to 1L. Remains on dilaudid at 5mg /hr. RN reports precedex started overnight for anxiety. 8/4: Ultrasound-guided aspiration By interventional radiology of small perihepatic fluid collection yielded only 1 mL of the fluid, sent for culture. NPO - mild coffee ground on NG but no active bleed  As of 8/6, patient  improving. Off Precedex drip since 8/4. Dilaudid Drip being changed over to PCA. Still with some coffee ground -looking material in NG tube. White blood cell count staying elevated at 14-16 for past 10 days. Hemoglobin Stable for the past 2 weeks between 7-8. Still on TPN  Assessment/Plan: Sepsis -Secondary to peritonitis and MSSA HCAP -05/31/2017 lactic acid 1.2 -WBC overall stable 14-16 for past 10 days -Completed cefepime 14 days (05/12/2017>> 05/26/2017) -Continuing cefepime and metronidazole per surgery -Please see summary of antibiotics below  Acute respiratory failure with hypoxia -Secondary to sepsis and HCAP post op adrenalectomy -Presently stable on 1 L-->weaned to RA -Extubated 05/28/2017 -Maintain oxygen saturation greater than 92% -continue Duonebs -05/30/2017--personally reviewed CXR--bibasilar atelectasis, no consolidation  Peritonitis/intestinal perforation/anastomotic leak -05/29/2017 CT abdomen and pelvis thick walled fluid collection along anterior and lateral aspects of the liver; extends 20 cm along anterior and lateral margins of liver -05/30/2017 IR aspiration--cultures negative; suspected hematoma -Continue cefepime and metronidazole per general surgery -05/20/17--ABDOMINAL WOUND Gila OUT, WOUND CLOSURE AND WOUND VAC PLACEMENT (this was most recent operative procedure) -NG remains LIS--management per surgery -diet advancement per general surgery--currently ice chips only  MSSA HCAP -finished 7 days of cefepime  Acute blood loss anemia -Baseline Hgb ~12 prior to admission -06/01/17--Hgb10.4--likely spurious -Hgb 7-8 and stable for past 2 weeks -has coffee ground returns but so far bp /hr stability and cbc stable -Continue PPI twice a day -TPN per general surgery and pharmacy -Follow CBC -post op care per general surgery -holding lovenox-->SCDs -transfuse for Hgb<7  R-adrenal mass/pheochromocytoma -s/p R-adrenalectomy -complicated by small bowel  injury in the setting of adhesions with  sepsis secondary to intra-abdominal infection -post-op care per surgery  Acute metabolic encephalopathy -multifactorial including sepsis, infection, electrolyte derangement, opioids/benzos -overall improving  Thrombocytosis -likely acute phase reactant and possible iron deficiency -check iron studies -pt had normal platelet count at time of admission  Essential Hypertension -remains stable off home clonidine, labetalol, chlorthalidone, valsartan  Chronic pain syndrome/Post-op pain -had significant post-op pain -remains on dilaudid PCA, po oxycodone scheduled, ativan scheduled -pain management per [facility] -previously on home gaba, percocet  Hyperlipidemia -remain off lipitor until able to tolerate po reliably  Tobacco abuse -nicoderm patch  Hyperglycemia -likely stress induced and TPN -check A1C -continue novolog sliding scale    Disposition Plan:   Remain in stepdown Family Communication:   Spouse updated at bedside 8/7--Total time spent 35 minutes.  Greater than 50% spent face to face counseling and coordinating care.   Consultants: General surgery is primary   Code Status:  FULL   DVT Prophylaxis:  SCDs   Procedures: As Listed in Progress Note Above  Antibiotics: Vancomycin 7/17 >>>7/21; 7/30 >> 8/1 Eraxis 7/19 >>> 7/21, 7/26 >> 8/1 Cefazolin 7/31>>>8/3 Cefepime 7/17 >>>7/31 ; 8/3>>> Flagyl 7/20 >>> 7/30; 8/3>>>      Subjective: Patient denies fevers, chills, headache, chest pain, dyspnea, nausea, vomiting, diarrhea,  dysuria, hematuria, hematochezia, and melena.  He is passing flatus, but no BM.  Abd pain is controlled with PCA pump   Objective: Vitals:   06/02/17 0509 06/02/17 0600 06/02/17 0712 06/02/17 0813  BP:  132/88    Pulse:  (!) 106    Resp: 16 20  (!) 23  Temp:   98.7 F (37.1 C)   TempSrc:   Axillary   SpO2: 98% 97%  97%  Weight:      Height:        Intake/Output Summary  (Last 24 hours) at 06/02/17 0916 Last data filed at 06/02/17 0604  Gross per 24 hour  Intake             2984 ml  Output             2160 ml  Net              824 ml   Weight change:  Exam:   General:  Pt is alert, follows commands appropriately, not in acute distress  HEENT: No icterus, No thrush, No neck mass, Margaret/AT  Cardiovascular: RRR, S1/S2, no rubs, no gallops  Respiratory: CTA bilaterally, no wheezing, no crackles, no rhonchi  Abdomen: Soft/+BS, non tender, non distended, no guarding  Extremities: No edema, No lymphangitis, No petechiae, No rashes, no synovitis   Data Reviewed: I have personally reviewed following labs and imaging studies Basic Metabolic Panel:  Recent Labs Lab 05/28/17 0211 05/29/17 0316 05/30/17 0430 05/31/17 0315 06/01/17 0543 06/02/17 0413  NA 135 134* 132* 133* 134* 132*  K 4.2 3.8 3.7 4.1 4.3 4.7  CL 96* 98* 95* 98* 99* 100*  CO2 32 28 25 26 25 22   GLUCOSE 115* 161* 113* 119* 109* 109*  BUN 21* 18 17 20 20  22*  CREATININE 0.69 0.54* 0.57* 0.67 0.67 0.66  CALCIUM 8.3* 8.3* 8.6* 8.4* 8.6* 8.9  MG 2.0 1.8 1.7 1.9 2.1  --   PHOS 5.1* 3.1 3.8 4.6 4.5  --    Liver Function Tests:  Recent Labs Lab 05/28/17 0211 06/01/17 0543  AST 38 41  ALT 18 37  ALKPHOS 215* 269*  BILITOT 0.5 0.6  PROT 6.7 7.3  ALBUMIN 1.7*  2.2*   No results for input(s): LIPASE, AMYLASE in the last 168 hours. No results for input(s): AMMONIA in the last 168 hours. Coagulation Profile:  Recent Labs Lab 05/30/17 0900  INR 1.21   CBC:  Recent Labs Lab 05/29/17 0316 05/30/17 0430 05/31/17 1235 06/01/17 0543 06/02/17 0413  WBC 16.3* 16.4* 14.5* 12.6* 15.3*  NEUTROABS  --   --  9.7* 8.5* 9.9*  HGB 7.2* 7.8* 8.1* 10.4* 8.9*  HCT 21.6* 23.5* 24.1* 31.3* 26.1*  MCV 81.2 80.5 78.2 79.8 79.3  PLT 852* 941* 890* 742* 898*   Cardiac Enzymes: No results for input(s): CKTOTAL, CKMB, CKMBINDEX, TROPONINI in the last 168 hours. BNP: Invalid input(s):  POCBNP CBG:  Recent Labs Lab 06/01/17 0553 06/01/17 1141 06/01/17 1547 06/01/17 2335 06/02/17 0558  GLUCAP 112* 124* 107* 131* 127*   HbA1C: No results for input(s): HGBA1C in the last 72 hours. Urine analysis:    Component Value Date/Time   COLORURINE AMBER (A) 05/22/2017 1300   APPEARANCEUR CLEAR 05/22/2017 1300   LABSPEC 1.021 05/22/2017 1300   PHURINE 6.0 05/22/2017 1300   GLUCOSEU NEGATIVE 05/22/2017 1300   HGBUR SMALL (A) 05/22/2017 1300   BILIRUBINUR NEGATIVE 05/22/2017 1300   KETONESUR NEGATIVE 05/22/2017 1300   PROTEINUR NEGATIVE 05/22/2017 1300   UROBILINOGEN 0.2 02/01/2014 0454   NITRITE NEGATIVE 05/22/2017 1300   LEUKOCYTESUR SMALL (A) 05/22/2017 1300   Sepsis Labs: @LABRCNTIP (procalcitonin:4,lacticidven:4) ) Recent Results (from the past 240 hour(s))  Body fluid culture     Status: None (Preliminary result)   Collection Time: 05/30/17 12:16 PM  Result Value Ref Range Status   Specimen Description FLUID ASPIRATE  Final   Special Requests SMALL PERIHEPATIC FLUID  Final   Gram Stain   Final    FEW WBC PRESENT, PREDOMINANTLY PMN NO ORGANISMS SEEN    Culture   Final    NO GROWTH 2 DAYS Performed at [facility], [place] [address], [place], [place] [address]    Report Status PENDING  Incomplete     Scheduled Meds: . chlorhexidine  15 mL Mouth Rinse BID  . Chlorhexidine Gluconate Cloth  6 each Topical Q1200  . HYDROmorphone   Intravenous Q4H  . insulin aspart  0-15 Units Subcutaneous Q6H  . ipratropium-albuterol  3 mL Nebulization BID  . lip balm  1 application Topical BID  . LORazepam  2 mg Oral Q6H  . mouth rinse  15 mL Mouth Rinse q12n4p  . nicotine  21 mg Transdermal Daily  . oxyCODONE  10 mg Per Tube Q4H  . pantoprazole (PROTONIX) IV  40 mg Intravenous Q12H  . thiamine injection  100 mg Intravenous Daily   Continuous Infusions: . ceFEPime (MAXIPIME) IV Stopped (06/02/17 0531)  . dextrose 5 % and 0.45% NaCl 10 mL/hr at 06/02/17 0600  .  Marland KitchenTPN (CLINIMIX-E) Adult     And  . fat emulsion    . metronidazole Stopped (06/02/17 7517)  . Marland KitchenTPN (CLINIMIX-E) Adult 120 mL/hr at 06/02/17 0600    Procedures/Studies: Dg Chest 1 View  Result Date: 05/20/2017 CLINICAL DATA:  Postoperative desaturation. EXAM: CHEST 1 VIEW COMPARISON:  Portable chest x-ray of May 20, 2017 FINDINGS: The lungs are adequately inflated. There is persistent increased density in the retrocardiac region on the left with obscuration of the hemidiaphragm and blunting of the lateral costophrenic angle. On the right there is patchy increased density just above the hemidiaphragm which is stable. There is no pneumothorax. The heart and pulmonary vascularity are normal.  There is calcification wall of the aortic arch. The endotracheal tube lies approximately 12 mm above the carina. The esophagogastric tube's proximal port lies at or just below the GE junction with the tip projecting below the inferior margin of the image. The PICC line tip projects over the midportion of the SVC. IMPRESSION: Persistent low positioning of the endotracheal tube. Withdrawal by 2 cm is recommended in an effort to avoid accidental mainstem bronchus intubation with patient movement. Persistent bibasilar atelectasis or pneumonia. Small left pleural effusion. Thoracic aortic atherosclerosis. Electronically Signed   By: Lilyanah Celestin  [place] M.D.   On: 05/20/2017 13:31   Ct Abdomen Pelvis W Contrast  Result Date: 05/29/2017 CLINICAL DATA:  Status post abdominal wound washout, wound closure and wound VAC placement on July 25th. Abdominal pain and fever. Abscess suspected. EXAM: CT ABDOMEN AND PELVIS WITH CONTRAST TECHNIQUE: Multidetector CT imaging of the abdomen and pelvis was performed using the standard protocol following bolus administration of intravenous contrast. CONTRAST:  36mL ISOVUE-300 IOPAMIDOL (ISOVUE-300) INJECTION 61%, 143mL ISOVUE-300 IOPAMIDOL (ISOVUE-300) INJECTION 61% COMPARISON:  None. FINDINGS:  Lower chest: Small right pleural effusion. Mild bibasilar atelectasis. Hepatobiliary: No focal liver abnormality is seen. Gallbladder is mildly distended but otherwise unremarkable. No bile duct dilatation seen. Pancreas: Unremarkable. No pancreatic ductal dilatation or surrounding inflammatory changes. Spleen: Normal in size without focal abnormality. Adrenals/Urinary Tract: Status post right adrenalectomy. Fluid collection in the right adrenalectomy bed has decreased in size, now measuring approximately 4 x 2 cm (previously 5 x 4 cm). Left adrenal gland appears normal. Kidneys are unremarkable without mass or hydronephrosis. Small amount of air in the bladder, likely related to recent catheterization. Bladder otherwise unremarkable. Stomach/Bowel: Status post partial colectomy with surgical bowel anastomoses in the right upper quadrant. Two percutaneous jejunostomy tubes in place, grossly appropriate in position. Additional percutaneous drainage catheter in place with tip projecting across the midline into the anterior left abdomen. No dilated large or small bowel loops.  Enteric tube in the stomach. Vascular/Lymphatic: Aortic atherosclerosis. No acute appearing vascular abnormality. Scattered small and mildly prominent lymph nodes within the abdomen and pelvis, likely reactive in nature. Reproductive: Prostate is unremarkable. Other: Small amount of free fluid in the abdomen and pelvis. Probable thin abscess collection along the anterior and lateral aspects of the liver, measuring approximately 2 cm greatest thickness, extending for approximately 20 cm along the anterolateral aspects of the liver. No free intraperitoneal air seen. Expected foci of extraperitoneal air seen along the right abdominal wall surgical defect. Musculoskeletal: No acute or suspicious osseous finding. IMPRESSION: 1. Thick-walled fluid collection along the anterior and lateral aspects of the liver. The collection is thin measuring  approximately 2 cm greatest thickness (measured on the coronal reconstructions series 5, image 24) but extends for approximately 20 cm along the anterior and lateral margins of the liver (axial series 2, image 22). This is a presumed abscess collection. 2. No evidence of additional abscess collection within the abdomen or pelvis. 3. Small free fluid in the abdomen and pelvis. 4. Nonobstructive bowel gas pattern. Surgical changes of partial bowel resection. No evidence of surgical complicating feature seen. 5. Small right pleural effusion. Small consolidations at each lung base are most likely atelectasis. Pneumonia or aspiration pneumonitis are possible but considered less likely. 6. Support apparatus appears appropriately positioned. 7. Decrease in size of the fluid collection at the right adrenalectomy bed, now measuring 4 x 2 cm (previously 5 x 4 cm). 8. Aortic atherosclerosis. These results will be called to the  ordering clinician or representative by the Radiologist Assistant, and communication documented in the PACS or zVision Dashboard. Electronically Signed   By: Franki Cabot M.D.   On: 05/29/2017 17:15   Ct Abdomen Pelvis W Contrast  Result Date: 05/13/2017 CLINICAL DATA:  Inpatient. Ileus. Severe abdominal pain. Status post laparoscopic converted to open right adrenalectomy on 43/32/9518 complicated by small bowel injury requiring small bowel resection with dense adhesions throughout the abdomen. Prior appendectomy and right hemicolectomy. EXAM: CT ABDOMEN AND PELVIS WITH CONTRAST TECHNIQUE: Multidetector CT imaging of the abdomen and pelvis was performed using the standard protocol following bolus administration of intravenous contrast. CONTRAST:  139mL ISOVUE-300 IOPAMIDOL (ISOVUE-300) INJECTION 61% COMPARISON:  05/11/2017 abdominal radiographs. 09/15/2015 CT abdomen/pelvis. 12/16/2016 chest CT. FINDINGS: Lower chest: Small dependent right pleural effusion. Patchy consolidation and ground-glass  attenuation throughout both lung bases with some volume loss. Hepatobiliary: Normal liver size. No discrete liver mass. Mild heterogeneity in the anterior inferior right liver lobe near the gallbladder fossa may be artifactual versus mild postsurgical change. Normal gallbladder with no radiopaque cholelithiasis. No biliary ductal dilatation. Pancreas: Normal, with no mass or duct dilation. Spleen: Normal size. No mass. Adrenals/Urinary Tract: Status post right adrenalectomy. Ill-defined 5.1 x 3.8 cm simple appearing fluid collection in the right adrenalectomy bed (series 2/ image 36) without internal gas or associated wall thickening. No left adrenal nodules. No hydronephrosis. No renal masses. Stomach/Bowel: Distended fluid-filled stomach with no appreciable gastric wall thickening or pneumatosis. There is diffuse dilatation of the small bowel up to 5.6 cm diameter, with fluid levels throughout the small bowel. Surgical sutures are noted in the right abdomen from enteroenterostomy sites. No discrete small bowel caliber transition site. No small bowel wall thickening. Status post right hemicolectomy with intact appearing ileocolic anastomosis. Mild diffuse dilatation of the large bowel with fluid levels throughout the large bowel. No large bowel wall thickening. Vascular/Lymphatic: Atherosclerotic nonaneurysmal abdominal aorta. Patent portal, splenic, hepatic and renal veins. No pathologically enlarged lymph nodes in the abdomen or pelvis. Reproductive: Top-normal size prostate with nonspecific internal prostatic calcifications. Other: Pneumoperitoneum under the right hemidiaphragm and in the ventral right abdominal wall. No additional focal fluid collections. Open wound in the ventral right upper abdominal wall. Mild dependent superficial edema in the low back and lateral gluteal regions. Musculoskeletal: No aggressive appearing focal osseous lesions. IMPRESSION: 1. Diffuse dilatation of the small greater the large  bowel with fluid levels throughout the small and large bowel, most compatible with adynamic ileus in the postoperative state. No bowel wall thickening. No discrete small bowel caliber transition to suggest obstruction. 2. Pneumoperitoneum in the right upper quadrant is presumably due to recent surgery . 3. Small postoperative fluid collection at the right adrenalectomy site without wall thickening or internal gas. 4. Small dependent right pleural effusion. 5. Patchy bibasilar lung consolidation and ground-glass attenuation with some volume loss, concerning for multilobar pneumonia and/or aspiration with some atelectasis. Electronically Signed   By: Ilona Sorrel M.D.   On: 05/13/2017 18:01   US Aspiration  Result Date: 05/30/2017 INDICATION: 56 year old male status post complicated right adrenalectomy with small bowel injury, resection and anastomotic leak necessitating re- operative intervention. He currently has a thin rim of peripherally enhancing fluid in the perihepatic space and presents for ultrasound-guided aspiration. EXAM: Ultrasound-guided aspiration MEDICATIONS: The patient is currently admitted to the hospital and receiving intravenous antibiotics. The antibiotics were administered within an appropriate time frame prior to the initiation of the procedure. ANESTHESIA/SEDATION: None COMPLICATIONS: None immediate. PROCEDURE: Informed  written consent was obtained from the patient after a thorough discussion of the procedural risks, benefits and alternatives. All questions were addressed. A timeout was performed prior to the initiation of the procedure. Ultrasound was used to interrogate the right upper quadrant. There is a very thin rim of heterogeneous fluid in the perihepatic space. The fluid is a heterogeneous mix of hypoechoic and echogenic material. A suitable skin entry site was selected and marked. Local anesthesia was attained by infiltration with 1% lidocaine. A small dermatotomy was made. Under  real-time sonographic guidance, a 5 [place] Yueh centesis catheter was advanced into the fluid collection. Aspiration yielded a minimal amount of bloody fluid. No gross purulence. IMPRESSION: 1. Ultrasound reveals a thin rim of highly complex fluid in the perihepatic space. 2. Ultrasound aspiration yields minimal bloody fluid. This fluid collection likely represents mild perihepatic hematoma. The aspirated sample will be sent for culture. Electronically Signed   By: Jacqulynn Cadet M.D.   On: 05/30/2017 12:33   Dg Chest Port 1 View  Result Date: 05/30/2017 CLINICAL DATA:  History of total adrenalectomy. EXAM: PORTABLE CHEST 1 VIEW COMPARISON:  Prior radiograph from 05/29/2017. FINDINGS: Enteric tube courses in the abdomen. Cardiac and mediastinal silhouettes are stable. Aortic atherosclerosis. Right PICC catheter terminates at the cavoatrial junction. Elevation the right hemidiaphragm with associated right basilar opacity, atelectasis or infiltrate. Mild left basilar subsegmental atelectasis. No appreciable pleural effusion. Perihilar vascular congestion without pulmonary edema. No pneumothorax. No acute osseus abnormality.  Cervical ACDF noted. IMPRESSION: 1. Elevation of the right hemidiaphragm with similar right basilar opacity, either atelectasis or infiltrate. Mild left basilar subsegmental atelectasis. 2. Stable position of right PICC catheter. Electronically Signed   By: Jeannine Boga M.D.   On: 05/30/2017 06:34   Dg Chest Port 1 View  Result Date: 05/29/2017 CLINICAL DATA:  Respiratory failure. EXAM: PORTABLE CHEST 1 VIEW COMPARISON:  05/28/2017. FINDINGS: Interim extubation. NG tube and right PICC line in stable position. Heart size normal. Stable right base atelectasis and infiltrate. Small left pleural effusion. Cervical spine fusion. IMPRESSION: 1. Interim extubation. NG tube and right PICC line in stable position. 2. Stable right base atelectasis/infiltrate. Mild left base subsegmental  atelectasis. Electronically Signed   By: Marcello Moores  Register   On: 05/29/2017 06:40   Dg Chest Port 1 View  Result Date: 05/28/2017 CLINICAL DATA:  Intubated patient, respiratory failure patient's proximally 10 days post laparotomy and subsequent abdominal wound clean out. EXAM: PORTABLE CHEST 1 VIEW COMPARISON:  Portable chest x-ray of May 27, 2017 FINDINGS: The left lung is well-expanded. Minimal increased density is present in the retrocardiac region and is stable. On the right there is persistent patchy density in the lower lung with partial obscuration of the hemidiaphragm. There is no pneumothorax or large pleural effusion. The heart is normal in size. The pulmonary vascularity is not engorged. There is calcification in the wall of the aortic arch. The endotracheal tube tip lies 5.2 cm above the carina. The esophagogastric tube has its proximal port at or just above the GE junction with the tip in the region of the gastric cardia below the inferior margin of the image. The right PICC line tip projects over the junction of middle and distal thirds of the SVC. IMPRESSION: There is persistent right basilar atelectasis or pneumonia little changed from yesterday's study. Subtle density at the left lung base medially is stable and compatible with atelectasis as well. Advancement of the esophagogastric tube by 10 cm is recommended to assure that  the proximal port rib is positioned below the GE junction. Appropriate positioning of the esophagogastric tube. Thoracic aortic atherosclerosis. Electronically Signed   By: Cameo Shewell  [place] M.D.   On: 05/28/2017 07:11   Dg Chest Port 1 View  Result Date: 05/27/2017 CLINICAL DATA:  Respiratory failure . EXAM: PORTABLE CHEST 1 VIEW COMPARISON:  05/26/2017 . FINDINGS: Endotracheal tube, NG tube, right PICC line in stable position . Heart size normal. Bibasilar atelectasis and infiltrates. No pleural effusion or pneumothorax . Cervical spine fusion. IMPRESSION: 1.  Lines and  tubes in stable position. 2. Bibasilar atelectasis and infiltrates again noted. No change from prior exam. Electronically Signed   By: Marcello Moores  Register   On: 05/27/2017 07:16   Dg Chest Port 1 View  Result Date: 05/26/2017 CLINICAL DATA:  Respiratory failure. EXAM: PORTABLE CHEST 1 VIEW COMPARISON:  05/25/2017 . FINDINGS: Endotracheal tube, NG tube, right PICC line in stable position. Heart size normal. Low lung volumes with bibasilar atelectasis and infiltrates. Interim improvement from prior exam. No significant pleural effusion noted on today's exam. No pneumothorax . IMPRESSION: 1. Lines and tubes in stable position. 2. Low lung volumes with persistent bibasilar atelectasis and infiltrates. Interim improvement from prior exam. Electronically Signed   By: Marcello Moores  Register   On: 05/26/2017 06:39   Dg Chest Port 1 View  Result Date: 05/25/2017 CLINICAL DATA:  Intubation. EXAM: PORTABLE CHEST 1 VIEW COMPARISON:  05/24/2017 . FINDINGS: Endotracheal tube, NG tube, right PICC line stable position. Stable cardiomegaly. Persistent unchanged bibasilar atelectasis and infiltrates noted. Small left pleural effusion cannot be excluded. No pneumothorax. IMPRESSION: 1. Lines and tubes in stable position. 2. Persistent unchanged bibasilar atelectasis and infiltrates noted. Small left pleural effusion cannot be excluded. Electronically Signed   By: Marcello Moores  Register   On: 05/25/2017 06:08   Dg Chest Port 1 View  Result Date: 05/24/2017 CLINICAL DATA:  Endotracheal tube. EXAM: PORTABLE CHEST 1 VIEW COMPARISON:  05/23/2017 FINDINGS: Endotracheal tube terminates 5.5 cm above the carina. Enteric tube terminates over the proximal stomach with side hole overlying the distal esophagus. The cardiomediastinal silhouette is unchanged. Left basilar airspace opacities on the prior study have decreased. Right basilar opacities are unchanged. No sizable pleural effusion or pneumothorax is identified. IMPRESSION: Improved aeration  of the left lung base. Unchanged right basilar infiltrates. Electronically Signed   By: Logan Bores M.D.   On: 05/24/2017 07:05   Dg Chest Port 1 View  Result Date: 05/23/2017 CLINICAL DATA:  Acute respiratory failure. EXAM: PORTABLE CHEST 1 VIEW COMPARISON:  05/22/2017 FINDINGS: Endotracheal tube terminates 3- 3.5 cm above the carina. Enteric tube courses into the left upper abdomen with tip not imaged. The cardiomediastinal silhouette is unchanged. Left greater than right basilar lung opacities are unchanged. No large pleural effusion or pneumothorax is identified. IMPRESSION: Unchanged left greater than right basilar lung opacities which may reflect pneumonia. Electronically Signed   By: Logan Bores M.D.   On: 05/23/2017 07:30   Dg Chest Port 1 View  Result Date: 05/22/2017 CLINICAL DATA:  Acute respiratory failure, hypoxia EXAM: PORTABLE CHEST 1 VIEW COMPARISON:  05/21/2017 FINDINGS: Interval retraction of the endotracheal tube with the tip now 4 cm above the carina. NG tube enters the stomach. Bilateral perihilar and lower lobe airspace opacities are similar to prior study. Mild cardiomegaly. IMPRESSION: Interval repositioning of endotracheal tube, now 4 cm above the carina. Continued bilateral perihilar and lower lobe airspace opacities, left greater than right. Electronically Signed   By: Rolm Baptise  M.D.   On: 05/22/2017 07:10   Dg Chest Port 1 View  Result Date: 05/21/2017 CLINICAL DATA:  Acute respiratory failure with hypoxia. EXAM: PORTABLE CHEST 1 VIEW COMPARISON:  Radiograph May 20, 2017. FINDINGS: Stable cardiomediastinal silhouette. Atherosclerosis of thoracic aorta is noted. No pneumothorax is noted. Right-sided PICC line is unchanged in position. Stable bibasilar atelectasis is noted. Nasogastric tube is seen entering stomach. Endotracheal tube appears to be slightly directed into right mainstem bronchus. IMPRESSION: Aortic atherosclerosis. Stable bibasilar subsegmental atelectasis.  Endotracheal tube appears to be slightly directed into right mainstem bronchus, and withdrawal by 3 cm is recommended. These results will be called to the ordering clinician or representative by the Radiologist Assistant, and communication documented in the PACS or zVision Dashboard. Electronically Signed   By: Marijo Conception, M.D.   On: 05/21/2017 07:32   Dg Chest Port 1 View  Result Date: 05/20/2017 CLINICAL DATA:  Acute respiratory failure. EXAM: PORTABLE CHEST 1 VIEW COMPARISON:  May 18, 2017 FINDINGS: The ETT terminates 11 mm above the carina. Recommend withdrawing 1 cm. The NG tube terminates in the left upper quadrant. A right PICC line terminates in the central SVC. No pneumothorax. Bibasilar opacities and possible small effusions are stable. No other interval changes. IMPRESSION: 1. The ETT terminates 11 mm above the carina. Recommend withdrawing 1 cm. 2. Stable bibasilar opacities. Possible small effusions. No other changes. Electronically Signed   By: Dorise Bullion III M.D   On: 05/20/2017 07:19   Dg Chest Port 1 View  Result Date: 05/18/2017 CLINICAL DATA:  Respiratory failure. EXAM: PORTABLE CHEST 1 VIEW COMPARISON:  05/17/2017 . FINDINGS: Endotracheal tube, NG tube, right PICC line in stable position. Heart size stable . Improved left upper lobe infiltrate/edema . Persistent diffuse bilateral pulmonary infiltrates/edema. Persistent left base atelectasis. Persistent small left pleural effusion. No pneumothorax. IMPRESSION: 1. Lines and tubes in stable position. 2. Improved left upper lobe infiltrate/edema. 3. Persistent bibasilar pulmonary infiltrates/edema. Persistent left base atelectasis. Persistent small left pleural effusion . Electronically Signed   By: Marcello Moores  Register   On: 05/18/2017 06:48   Dg Chest Port 1 View  Result Date: 05/17/2017 CLINICAL DATA:  Acute respiratory failure with hypoxemia EXAM: PORTABLE CHEST 1 VIEW COMPARISON:  05/16/2017 FINDINGS: Endotracheal tube in good  position. NG tube in the stomach. PICC tip in the SVC. Progression of left upper lobe airspace disease with new airspace disease left upper lobe. Right lower lobe airspace disease unchanged. Progression of left upper lobe airspace disease since the prior study. IMPRESSION: Progression of left lower lobe and left upper lobe airspace disease. No change right lower lobe airspace disease. Electronically Signed   By: Franchot Gallo M.D.   On: 05/17/2017 07:00   Dg Chest Port 1 View  Result Date: 05/16/2017 CLINICAL DATA:  Respiratory failure EXAM: PORTABLE CHEST 1 VIEW COMPARISON:  05/15/2017 FINDINGS: Endotracheal tube 3 cm above the carina. NG tube in the stomach. Interval placement of right arm PICC tip in the SVC Bibasilar airspace disease unchanged. Negative for heart failure or effusion IMPRESSION: PICC tip in the SVC. Bibasilar airspace disease unchanged. Electronically Signed   By: Franchot Gallo M.D.   On: 05/16/2017 07:25   Dg Chest Port 1 View  Result Date: 05/15/2017 CLINICAL DATA:  Respiratory failure.  Hypoxia. EXAM: PORTABLE CHEST 1 VIEW COMPARISON:  05/14/2017 . FINDINGS: Endotracheal tube is been repositioned, its tip is 3.2 cm above the carina. NG tube noted with tip coiled in stomach. Heart size normal.  Diffuse bilateral pulmonary infiltrates again noted. Left pulmonary infiltrate has improved prior exam. No prominent pleural effusion. No pneumothorax . IMPRESSION: 1. Interim endotracheal tube repositioning with its tip now 3.2 cm above the carina. NG tube noted with tip coiled in stomach. 2. Persistent bilateral pulmonary infiltrates. Left pulmonary infiltrate has improved from prior exam . 3. Stable cardiomegaly . Electronically Signed   By: Marcello Moores  Register   On: 05/15/2017 07:11   Dg Chest Port 1 View  Result Date: 05/14/2017 CLINICAL DATA:  Acute respiratory failure EXAM: PORTABLE CHEST 1 VIEW COMPARISON:  Yesterday FINDINGS: Endotracheal tube tip is shorter than before, now at the  level the clavicular heads. An orogastric tube is no longer seen. Worsening inflation. Diffuse interstitial and airspace opacity, greater on the left where there could be layering fluid. No pneumothorax. Grossly normal heart size. IMPRESSION: 1. Higher endotracheal tube, tip at the clavicular heads. An orogastric tube is no longer seen. 2. Lower lung volumes. Diffuse airspace disease suspicious for ARDS or multifocal infection/aspiration. Electronically Signed   By: Monte Fantasia M.D.   On: 05/14/2017 08:59   Dg Chest Port 1 View  Result Date: 05/14/2017 CLINICAL DATA:  Acute respiratory failure with hypoxemia, current smoker. EXAM: PORTABLE CHEST 1 VIEW COMPARISON:  05/11/2017 FINDINGS: Heart size is top normal with aortic atherosclerosis. Endotracheal tube tip is seen 3.4 cm above the carina. A gastric tube is noted in the expected location of the stomach with tip in the region of the gastric antrum. Interstitial edema is noted with central vascular congestion. Mild prominence of the right hilum is again noted which may reflect central vascular congestion and/or adenopathy. Superimposed pneumonia is not entirely excluded. Resolution of free intraperitoneal air beneath the right hemidiaphragm on current study. No acute nor suspicious osseous abnormality. IMPRESSION: 1. Satisfactory support line and tube positions. 2. Bilateral diffuse interstitial edema is again noted with slight improvement in aeration at the right lung base and resolution of pneumoperitoneum. 3. Right hilar prominence is seen similar to prior and may reflect adenopathy, vascular summation or possibly superimposed pneumonia. Electronically Signed   By: Ashley Royalty M.D.   On: 05/14/2017 03:58   Dg Chest Port 1 View  Result Date: 05/09/2017 CLINICAL DATA:  56 year old male with history of shortness of breath. History of prior inguinal hernia repair. EXAM: PORTABLE CHEST 1 VIEW COMPARISON:  Chest x-ray 05/07/2017. FINDINGS: Elevated right  hemidiaphragm with evidence of pneumoperitoneum beneath the right hemidiaphragm. Opacities at the right lung base likely reflect subsegmental atelectasis, although underlying airspace consolidation is not excluded. Patchy areas of interstitial prominence are noted in the lungs bilaterally, most significant in the right mid to upper lung, concerning for potential developing bronchopneumonia. Small right pleural effusion. No left pleural effusion. No evidence of pulmonary edema. Heart size is normal. Upper mediastinal contours are within normal limits. Aortic atherosclerosis. Orthopedic fixation hardware in the lower cervical spine. IMPRESSION: 1. Findings are concerning for developing multilobar bronchopneumonia. 2. Worsening atelectasis and/or consolidation in the right lower lobe. Small right pleural effusion. 3. Persistent pneumoperitoneum beneath the right hemidiaphragm, presumably iatrogenic related to recent surgery. Electronically Signed   By: Vinnie Langton M.D.   On: 05/09/2017 23:48   Dg Chest Port 1 View  Result Date: 05/07/2017 CLINICAL DATA:  Rapid respiration. Abdominal distension and shortness of breath. Laparoscopic adrenalectomy and small bowel resection with reanastomosis May 06, 2017. EXAM: PORTABLE CHEST 1 VIEW COMPARISON:  Mar 05, 2015 FINDINGS: Free air seen under the right hemidiaphragm consistent with  surgery yesterday. No pneumothorax. The cardiomediastinal silhouette is normal. Bibasilar opacities are likely atelectasis. IMPRESSION: 1. Free air under the right hemidiaphragm consistent with surgery yesterday. 2. Bibasilar opacities, likely atelectasis. Electronically Signed   By: Dorise Bullion III M.D   On: 05/07/2017 21:04   Dg Abd Acute W/chest  Result Date: 05/11/2017 CLINICAL DATA:  Shortness of breath and abdominal pain EXAM: DG ABDOMEN ACUTE W/ 1V CHEST COMPARISON:  Chest x-ray from 2 days pper FINDINGS: Progressive diffuse airspace opacity. Lung volumes are low. Prominent  heart size, accentuated by technique. No pneumothorax. Recent abdominal surgery with similar appearing pneumoperitoneum. Diffuse colonic, gastric, and small bowel dilatation consistent with an ileus in this setting. Surgical clips and bowel sutures. IMPRESSION: 1. Progressive bilateral airspace disease which could be edema (likely noncardiogenic), infection, or aspiration. 2. Ileus pattern. Electronically Signed   By: Monte Fantasia M.D.   On: 05/11/2017 08:41    Marthena Whitmyer, DO  [facility] Pager [phone]  If 7PM-7AM, please contact night-coverage [email or web address] Password [email or web address] 06/02/2017, 9:16 AM   LOS: 27 days

## 2017-06-03 LAB — CBC WITH DIFFERENTIAL/PLATELET
BASOS ABS: 0.1 10*3/uL (ref 0.0–0.1)
Basophils Relative: 1 %
Eosinophils Absolute: 0.6 10*3/uL (ref 0.0–0.7)
Eosinophils Relative: 4 %
HEMATOCRIT: 29 % — AB (ref 39.0–52.0)
HEMOGLOBIN: 9.7 g/dL — AB (ref 13.0–17.0)
LYMPHS PCT: 21 %
Lymphs Abs: 3.4 10*3/uL (ref 0.7–4.0)
MCH: 26.4 pg (ref 26.0–34.0)
MCHC: 33.4 g/dL (ref 30.0–36.0)
MCV: 79 fL (ref 78.0–100.0)
MONO ABS: 1.2 10*3/uL — AB (ref 0.1–1.0)
MONOS PCT: 7 %
NEUTROS ABS: 10.8 10*3/uL — AB (ref 1.7–7.7)
NEUTROS PCT: 67 %
Platelets: 889 10*3/uL — ABNORMAL HIGH (ref 150–400)
RBC: 3.67 MIL/uL — ABNORMAL LOW (ref 4.22–5.81)
RDW: 16.7 % — AB (ref 11.5–15.5)
WBC: 16.1 10*3/uL — ABNORMAL HIGH (ref 4.0–10.5)

## 2017-06-03 LAB — GLUCOSE, CAPILLARY
GLUCOSE-CAPILLARY: 121 mg/dL — AB (ref 65–99)
Glucose-Capillary: 127 mg/dL — ABNORMAL HIGH (ref 65–99)
Glucose-Capillary: 117 mg/dL — ABNORMAL HIGH (ref 65–99)
Glucose-Capillary: 121 mg/dL — ABNORMAL HIGH (ref 65–99)

## 2017-06-03 LAB — HEMOGLOBIN A1C
Hgb A1c MFr Bld: 5.6 % (ref 4.8–5.6)
Mean Plasma Glucose: 114 mg/dL

## 2017-06-03 MED ORDER — TRACE MINERALS CR-CU-MN-SE-ZN 10-1000-500-60 MCG/ML IV SOLN
INTRAVENOUS | Status: AC
  Administered 2017-06-03: 18:00:00 via INTRAVENOUS
  Filled 2017-06-03 (×2): qty 2880

## 2017-06-03 MED ORDER — FAT EMULSION 20 % IV EMUL
240.0000 mL | INTRAVENOUS | Status: AC
  Administered 2017-06-03: 240 mL via INTRAVENOUS
  Filled 2017-06-03: qty 250

## 2017-06-03 NOTE — Progress Notes (Signed)
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 Patient's course was complicated by small bowel injury requiring resection with anastomoses and placement of wound VAC. On 7/18, patient developed leak and was taken back to the operating room for redo of his exploratory laparotomy and repair of leak. That time, patient returned to the ICU on the ventilator. Critical care was consulted for ventilator/medical management.  Over the next 2 weeks, patient's hospital course was complicated with the following: 07/19 - NGT pulled out and almost self-extubated. Developed cuff leak >> tube exchanged. Back to OR emergently for intestinal leak/ischemic bowel.TPN started at that time 07/22 - Back to OR for wash out >> left with open abdomen & wound vac in place. Transfused 1u PRBC. 07/25 - rising temp, WBC increased. Returned to OR for closure 07/27 - Recultured, sedation down on precedex [facility] 7/27 >> MRSE 1/2 > contaminant ? 07/30 - Remains on sedation, VAC leak over the weekend with dressing change by [facility] 07/31 - Initial attempt at weaning failed to due to tachypnea/anxiety 08/02 - Fever 101.1, WBC 14.2, weaned / extubated 8/3 - Extubated for 1 day, tolerating well. O2 weaned to 1L. Remains on dilaudid, initially on gtt now PCA. RN reports precedex started overnight for anxiety. 8/4: Ultrasound-guided aspiration By interventional radiology of small perihepatic fluid collection yielded only 1 mL of the fluid, sent for culture. No growth yet 8/6, patient improving. Off Precedex drip  since 8/4. White blood cell count staying elevated at 14-16 for past 10 days. Hemoglobin Stable for the past 2 weeks between 7-9. Still on TPN 8/8 wound vac changed per surgery. WBC 16. 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   Chronic narcotic use   Anxiety state   Hypokalemia   Anastomotic leak of intestine   Severe sepsis (HCC)   Wound dehiscence, surgical   Aspiration pneumonia (HCC)   Acute respiratory failure with hypoxemia (HCC)   Ileus (HCC)   Mucus plugging of bronchi   Pressure injury of skin   Peritonitis ([place])   Sepsis -Secondary to peritonitis and MSSA HCAP -05/31/2017 lactic acid 1.2 -WBC overall stable 14-16 for past 11 days -Completed cefepime 14 days (05/12/2017>>05/26/2017) -Off all antibiotics 8/8 -Please see summary of antibiotics below  Acute respiratory failure with hypoxia -Secondary to sepsis and HCAP post op adrenalectomy -Presently stable on 1 L-->weaned to RA -Extubated 05/28/2017 -Maintain oxygen saturation greater than 92% -continue Duonebs -Respiratory status stable  Peritonitis/intestinal perforation/anastomotic leak -05/29/2017 CT abdomen and pelvis thick walled fluid collection along anterior and lateral aspects of the liver; extends 20 cm along anterior and lateral margins of liver -05/30/2017 IR aspiration--cultures negative; suspected hematoma -Monitor off of antibiotics, discontinued on 8/8 -05/20/17--ABDOMINAL WOUND [place] OUT, WOUND CLOSURE AND WOUND VAC PLACEMENT.  Wound vacs changed 8/8 -NG remains  LIS--management per surgery -diet advancement per general surgery--currently ice chips only  MSSA HCAP -finished 7 days of  cefepime  Acute blood loss anemia -Baseline Hgb ~12 prior to admission -06/01/17--Hgb10.4--likely spurious -Hgb 7-8 and stable for past 2 weeks -hascoffee ground returns but so far bp /hr stability and cbc stable -Continue PPI twice a day -TPN per general surgery and pharmacy -Follow CBC -post op care per general surgery -holding lovenox-->SCDs -transfuse for Hgb<7  R-adrenal mass/pheochromocytoma -s/p R-adrenalectomy -complicated by small bowel injury in the setting of adhesions with sepsis secondary to intra-abdominal infection -post-op care per surgery  Acute metabolic encephalopathy -multifactorial including sepsis, infection, electrolyte derangement, opioids/benzos -overall improving  Thrombocytosis -likely acute phase reactant and possible iron deficiency -pt had normal platelet count at time of admission  Essential Hypertension -remains stable off home clonidine, labetalol, chlorthalidone, valsartan  Chronic pain syndrome/Post-op pain -had significant post-op pain -remains on dilaudid PCA, po oxycodone scheduled, ativan scheduled -pain management per [facility] -previously on home gaba, percocet  Hyperlipidemia -remain off lipitor until able to tolerate po reliably  Tobacco abuse -nicoderm patch  Hyperglycemia -likely stress induced and TPN -A1C was 5.6, not a diabetic -continue novolog sliding scale  DVT prophylaxis: SCDs Code Status: Full code Family Communication: no family at bedside Disposition Plan: remain in SDU. Overall dispo per general surgery    Procedures:   As per progress note  Antimicrobials: Vancomycin 7/17 >>>7/21; 7/30 >>8/1 Eraxis 7/19 >>> 7/21, 7/26 >> 8/1 Cefazolin 7/31>>>8/3 Cefepime 7/17 >>>7/31 ; 8/3>>>8/8 Flagyl 7/20 >>> 7/30; 8/3>>> 8/8  Subjective: -Appears uncomfortable, somewhat restless.  Complains of abdominal pain 7/10.  No nausea or vomiting.  No chest pain or breathing difficulties.  Objective: Vitals:    06/03/17 0800 06/03/17 0944 06/03/17 1000 06/03/17 1100  BP:   130/76   Pulse:   (!) 103 (!) 101  Resp: (!) 22 20 (!) 21 (!) 21  Temp:      TempSrc:      SpO2: 95% 95% 95% 95%  Weight:      Height:        Intake/Output Summary (Last 24 hours) at 06/03/17 1219 Last data filed at 06/03/17 0800  Gross per 24 hour  Intake          3161.33 ml  Output             2680 ml  Net           481.33 ml   Filed Weights   05/21/17 0555 05/21/17 1111 05/26/17 0430  Weight: 94.6 kg (208 lb 8.9 oz) 94.6 kg (208 lb 8.9 oz) 93.3 kg (205 lb 11 oz)    Examination:  Vitals:   06/03/17 0800 06/03/17 0944 06/03/17 1000 06/03/17 1100  BP:   130/76   Pulse:   (!) 103 (!) 101  Resp: (!) 22 20 (!) 21 (!) 21  Temp:      TempSrc:      SpO2: 95% 95% 95% 95%  Weight:      Height:        Constitutional: restless Eyes:  lids and conjunctivae normal Respiratory: clear to auscultation bilaterally, no wheezing, no crackles. Cardiovascular: Regular rate and rhythm, no murmurs / rubs / gallops. No LE edema.  Abdomen: drains in place. Soft. BS+. Wound vac in place Musculoskeletal: no clubbing / cyanosis. No joint deformity upper and lower extremities. No contractures. Normal muscle tone.  Neurologic: CN 2-12 grossly intact. Strength 5/5 in all 4.    Data  Reviewed: I have independently reviewed following labs and imaging studies   CBC:  Recent Labs Lab 05/30/17 0430 05/31/17 1235 06/01/17 0543 06/02/17 0413 06/03/17 0723  WBC 16.4* 14.5* 12.6* 15.3* 16.1*  NEUTROABS  --  9.7* 8.5* 9.9* 10.8*  HGB 7.8* 8.1* 10.4* 8.9* 9.7*  HCT 23.5* 24.1* 31.3* 26.1* 29.0*  MCV 80.5 78.2 79.8 79.3 79.0  PLT 941* 890* 742* 898* 096*   Basic Metabolic Panel:  Recent Labs Lab 05/28/17 0211 05/29/17 0316 05/30/17 0430 05/31/17 0315 06/01/17 0543 06/02/17 0413  NA 135 134* 132* 133* 134* 132*  K 4.2 3.8 3.7 4.1 4.3 4.7  CL 96* 98* 95* 98* 99* 100*  CO2 32 28 25 26 25 22   GLUCOSE 115* 161* 113* 119*  109* 109*  BUN 21* 18 17 20 20  22*  CREATININE 0.69 0.54* 0.57* 0.67 0.67 0.66  CALCIUM 8.3* 8.3* 8.6* 8.4* 8.6* 8.9  MG 2.0 1.8 1.7 1.9 2.1  --   PHOS 5.1* 3.1 3.8 4.6 4.5  --    GFR: Estimated Creatinine Clearance: 118.3 mL/min (by C-G formula based on SCr of 0.66 mg/dL). Liver Function Tests:  Recent Labs Lab 05/28/17 0211 06/01/17 0543  AST 38 41  ALT 18 37  ALKPHOS 215* 269*  BILITOT 0.5 0.6  PROT 6.7 7.3  ALBUMIN 1.7* 2.2*   No results for input(s): LIPASE, AMYLASE in the last 168 hours. No results for input(s): AMMONIA in the last 168 hours. Coagulation Profile:  Recent Labs Lab 05/30/17 0900  INR 1.21   Cardiac Enzymes: No results for input(s): CKTOTAL, CKMB, CKMBINDEX, TROPONINI in the last 168 hours. BNP (last 3 results) No results for input(s): PROBNP in the last 8760 hours. HbA1C:  Recent Labs  06/02/17 0414  HGBA1C 5.6   CBG:  Recent Labs Lab 06/02/17 0558 06/02/17 1224 06/02/17 1635 06/02/17 2325 06/03/17 0647  GLUCAP 127* 123* 124* 123* 127*   Lipid Profile: No results for input(s): CHOL, HDL, LDLCALC, TRIG, CHOLHDL, LDLDIRECT in the last 72 hours. Thyroid Function Tests: No results for input(s): TSH, T4TOTAL, FREET4, T3FREE, THYROIDAB in the last 72 hours. Anemia Panel: No results for input(s): VITAMINB12, FOLATE, FERRITIN, TIBC, IRON, RETICCTPCT in the last 72 hours. Urine analysis:    Component Value Date/Time   COLORURINE AMBER (A) 05/22/2017 1300   APPEARANCEUR CLEAR 05/22/2017 1300   LABSPEC 1.021 05/22/2017 1300   PHURINE 6.0 05/22/2017 1300   GLUCOSEU NEGATIVE 05/22/2017 1300   HGBUR SMALL (A) 05/22/2017 1300   BILIRUBINUR NEGATIVE 05/22/2017 1300   KETONESUR NEGATIVE 05/22/2017 1300   PROTEINUR NEGATIVE 05/22/2017 1300   UROBILINOGEN 0.2 02/01/2014 0454   NITRITE NEGATIVE 05/22/2017 1300   LEUKOCYTESUR SMALL (A) 05/22/2017 1300   Sepsis Labs: Invalid input(s): PROCALCITONIN, LACTICIDVEN  Recent Results (from the  past 240 hour(s))  Body fluid culture     Status: None (Preliminary result)   Collection Time: 05/30/17 12:16 PM  Result Value Ref Range Status   Specimen Description FLUID ASPIRATE  Final   Special Requests SMALL PERIHEPATIC FLUID  Final   Gram Stain   Final    FEW WBC PRESENT, PREDOMINANTLY PMN NO ORGANISMS SEEN    Culture   Final    CULTURE REINCUBATED FOR BETTER GROWTH Performed at [facility], [address] [place], [place] [address]    Report Status PENDING  Incomplete      Radiology Studies: No results found.   Scheduled Meds: . chlorhexidine  15 mL Mouth Rinse BID  .  Chlorhexidine Gluconate Cloth  6 each Topical Q1200  . HYDROmorphone   Intravenous Q4H  . insulin aspart  0-15 Units Subcutaneous Q6H  . ipratropium-albuterol  3 mL Nebulization BID  . lip balm  1 application Topical BID  . LORazepam  2 mg Oral Q6H  . mouth rinse  15 mL Mouth Rinse q12n4p  . nicotine  21 mg Transdermal Daily  . oxyCODONE  10 mg Per Tube Q4H  . pantoprazole (PROTONIX) IV  40 mg Intravenous Q12H  . thiamine injection  100 mg Intravenous Daily   Continuous Infusions: . dextrose 5 % and 0.45% NaCl 10 mL/hr at 06/03/17 0800  . Marland KitchenTPN (CLINIMIX-E) Adult     And  . fat emulsion    . Marland KitchenTPN (CLINIMIX-E) Adult 120 mL/hr at 06/03/17 0800    Marzetta Board, MD, PhD [facility] Pager [phone]  If 7PM-7AM, please contact night-coverage [email or web address] Password [email or web address] 06/03/2017, 12:19 PM

## 2017-06-03 NOTE — Progress Notes (Signed)
Progress Note: General Surgery Service   Assessment/Plan: Patient Active Problem List   Diagnosis Date Noted  . Peritonitis ([place]) 06/01/2017  . Pressure injury of skin 05/29/2017  . Mucus plugging of bronchi   . Acute respiratory failure with hypoxemia ([place])   . Ileus ([place])   . Hypokalemia 05/14/2017  . Anastomotic leak of intestine 05/14/2017  . Severe sepsis ([place]) 05/14/2017  . Wound dehiscence, surgical 05/14/2017  . Aspiration pneumonia ([place]) 05/14/2017  . Chronic narcotic use 05/10/2017  . Anxiety state 05/10/2017  . Intra-abdominal adhesions s/p SB resection 05/06/2017 05/09/2017  . H/O total adrenalectomy ([place]) 05/06/2017  . Right adrenal mass s/p adrenalectomy 05/06/2017 05/06/2017  . Throat symptom 03/18/2016  . Multinodular goiter 01/19/2016  . Hyperthyroidism 01/19/2016  . Essential hypertension   . Diarrhea 11/30/2014  . Anemia, iron deficiency 11/01/2014  . Leukocytosis 11/03/2013  . Tobacco abuse 07/26/2013  . COPD (chronic obstructive pulmonary disease) ([place]) 07/26/2013  . Left knee pain 07/26/2013  . Pain in joint, shoulder region 05/03/2013  . GERD 07/24/2008  . 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   s/p Procedure(s): ABDOMINAL WOUND [place] OUT, WOUND CLOSURE AND WOUND VAC PLACEMENT 05/20/2017  Vac changed this morning, continues to show good granulation tissue and ingrowth -WBC stable, afebrile - stop abx -continue TPN -continue drain management Anxiety improved - continue ativan scheduled via tube +IV PRN Pain controlled - continue dilaudid with 1 basal rate and oxy per tube PT/OT/up to chair resp care with flutter valve and IS and deep breathing exercises   LOS: 28 days  Chief Complaint/Subjective: ETCO2 going off throughout the night, pain controlled, small BM yesterday  Objective: Vital signs in last 24 hours: Temp:  [97.1 F (36.2 C)-98.9 F (37.2 C)] 97.1 F (36.2 C) (08/08 0719) Pulse Rate:  [97-122] 100 (08/08 0600) Resp:   [19-30] 24 (08/08 0600) BP: (114-158)/(69-97) 115/81 (08/08 0600) SpO2:  [92 %-98 %] 95 % (08/08 0600) Last BM Date: 06/02/17 (ileostomy)  Intake/Output from previous day: 08/07 0701 - 08/08 0700 In: 3706.3 [I.V.:3271.3; NG/GT:125; IV Piggyback:300] Out: 2680 [Urine:1300; Emesis/NG output:1000; Drains:380] Intake/Output this shift: No intake/output data recorded.  Lungs: CTAB  Cardiovascular: tachycardic  Abd: soft, vac in position and functioning tubes in position with minimal leakage  Extremities: no edema  Neuro: AOx4  Lab Results: CBC   Recent Labs  06/02/17 0413 06/03/17 0723  WBC 15.3* 16.1*  HGB 8.9* 9.7*  HCT 26.1* 29.0*  PLT 898* 889*   BMET  Recent Labs  06/01/17 0543 06/02/17 0413  NA 134* 132*  K 4.3 4.7  CL 99* 100*  CO2 25 22  GLUCOSE 109* 109*  BUN 20 22*  CREATININE 0.67 0.66  CALCIUM 8.6* 8.9   PT/INR No results for input(s): LABPROT, INR in the last 72 hours. ABG No results for input(s): PHART, HCO3 in the last 72 hours.  Invalid input(s): PCO2, PO2  Studies/Results:  Anti-infectives: Anti-infectives    Start     Dose/Rate Route Frequency Ordered Stop   05/29/17 2200  ceFEPIme (MAXIPIME) 2 g in dextrose 5 % 50 mL IVPB  Status:  Discontinued     2 g 100 mL/hr over 30 Minutes Intravenous Every 8 hours 05/29/17 2031 06/03/17 0832   05/29/17 2100  metroNIDAZOLE (FLAGYL) IVPB 500 mg  Status:  Discontinued     500 mg 100 mL/hr over 60 Minutes Intravenous Every 8 hours 05/29/17 2029 06/03/17 0832   05/26/17 2200  ceFAZolin (ANCEF) IVPB 1 g/50 mL premix  Status:  Discontinued     1 g 100 mL/hr over 30 Minutes Intravenous Every 8 hours 05/26/17 1008 05/29/17 2023   05/25/17 1100  vancomycin (VANCOCIN) 1,250 mg in sodium chloride 0.9 % 250 mL IVPB  Status:  Discontinued     1,250 mg 166.7 mL/hr over 90 Minutes Intravenous Every 12 hours 05/25/17 0955 05/26/17 0946   05/22/17 1400  anidulafungin (ERAXIS) 100 mg in sodium chloride 0.9 %  100 mL IVPB     100 mg 78 mL/hr over 100 Minutes Intravenous Every 24 hours 05/21/17 1330 05/27/17 1740   05/21/17 1400  anidulafungin (ERAXIS) 200 mg in sodium chloride 0.9 % 200 mL IVPB     200 mg 78 mL/hr over 200 Minutes Intravenous  Once 05/21/17 1330 05/21/17 1940   05/15/17 1000  anidulafungin (ERAXIS) 100 mg in sodium chloride 0.9 % 100 mL IVPB  Status:  Discontinued     100 mg 78 mL/hr over 100 Minutes Intravenous Every 24 hours 05/14/17 0829 05/16/17 0901   05/15/17 1000  metroNIDAZOLE (FLAGYL) IVPB 500 mg  Status:  Discontinued     500 mg 100 mL/hr over 60 Minutes Intravenous Every 8 hours 05/15/17 0903 05/25/17 0938   05/14/17 0900  anidulafungin (ERAXIS) 200 mg in sodium chloride 0.9 % 200 mL IVPB     200 mg 78 mL/hr over 200 Minutes Intravenous  Once 05/14/17 0829 05/14/17 1325   05/13/17 1927  vancomycin (VANCOCIN) 1-5 GM/200ML-% IVPB    Comments:  Ward, Christa   : cabinet override      05/13/17 1927 05/14/17 0744   05/12/17 1400  ceFEPIme (MAXIPIME) 1 g in dextrose 5 % 50 mL IVPB     1 g 100 mL/hr over 30 Minutes Intravenous Every 8 hours 05/12/17 0939 05/26/17 2359   05/12/17 0900  vancomycin (VANCOCIN) IVPB 1000 mg/200 mL premix  Status:  Discontinued     1,000 mg 200 mL/hr over 60 Minutes Intravenous Every 12 hours 05/12/17 0750 05/16/17 0852   05/12/17 0830  aztreonam (AZACTAM) 2 GM IVPB     2 g 100 mL/hr over 30 Minutes Intravenous  Once 05/12/17 0800 05/12/17 0957   05/06/17 0916  vancomycin (VANCOCIN) IVPB 1000 mg/200 mL premix     1,000 mg 200 mL/hr over 60 Minutes Intravenous On call to O.R. 05/06/17 0916 05/06/17 1229      Medications: Scheduled Meds: . chlorhexidine  15 mL Mouth Rinse BID  . Chlorhexidine Gluconate Cloth  6 each Topical Q1200  . HYDROmorphone   Intravenous Q4H  . insulin aspart  0-15 Units Subcutaneous Q6H  . ipratropium-albuterol  3 mL Nebulization BID  . lip balm  1 application Topical BID  . LORazepam  2 mg Oral Q6H  . mouth  rinse  15 mL Mouth Rinse q12n4p  . nicotine  21 mg Transdermal Daily  . oxyCODONE  10 mg Per Tube Q4H  . pantoprazole (PROTONIX) IV  40 mg Intravenous Q12H  . thiamine injection  100 mg Intravenous Daily   Continuous Infusions: . dextrose 5 % and 0.45% NaCl 10 mL/hr at 06/02/17 0600  . Marland KitchenTPN (CLINIMIX-E) Adult     And  . fat emulsion    . Marland KitchenTPN (CLINIMIX-E) Adult 120 mL/hr at 06/02/17 1708   PRN Meds:.HYDROmorphone, labetalol, LORazepam, magic mouthwash, menthol-cetylpyridinium, naloxone **AND** sodium chloride flush, ondansetron **OR** ondansetron (ZOFRAN) IV, phenol, sodium chloride flush  Mickeal Skinner, MD Pg# [phone] [facility]

## 2017-06-03 NOTE — Progress Notes (Signed)
[place] NOTE   Pharmacy Consult for TPN Indication: prolonged ileus, small bowel leak, multiple bowel surgeries  Patient Measurements: Body mass index is 29.51 kg/m. Filed Weights   05/21/17 0555 05/21/17 1111 05/26/17 0430  Weight: 208 lb 8.9 oz (94.6 kg) 208 lb 8.9 oz (94.6 kg) 205 lb 11 oz (93.3 kg)   HPI: 12 yoM admitted on 7/11 for enlarging adrenal mass and planned adrenalectomy.  Pharmacy consulted to dose TPN.  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   7/22 OR:  wash out, tube ileostomy, left with open abdomen & wound vac in place.  Plan for OR on 7/25 for closure 7/25 OR: mesh placed for abdominal closure, wound vac 8/2 extubated, propofol drip off 8/4 TPN off for ~ 5 hours due to line detached from filter  Insulin requirements past 24 hours: 10 units SSI (no hx DM)  Current Nutrition: NPO  IVF: D5 1/2NS at10 ml/hr  Central access: PICC line ordered 7/19, placed on 7/20 TPN start date: 7/20  ASSESSMENT                                                                                                           Today, 06/03/17 - NO LABS - Glucose: at goal (goal < 150) - Electrolytes: WNL x Na 132.  - Renal:  SCr wnl, stable. I/O: +377, NG 1000, drains 380 - Weight: most recent 7/31 93.3kg down from 94.6kg on 7/26 - GI: VAC removed, drains in, coffee ground-looking material - LFTs: WNL x alk phos 269, trending up;  Tbili WNL - TGs:  296 (7/18), 266 (7/20), 269 (7/23), 210 (7/28), 285 (7/31) 301 (8/3)   - Prealbumin:  <5 (7/20), 6.1 (7/24), 10.1 (7/30) 24.8  (8/6) Prealbumin now WNL NUTRITIONAL GOALS                                                                                             RD recs (8/3):  134 - 152 gms Protein (1.5 - 1.7 g/kg) 2235 - 2505 Kcal  Clinimix 5/15 at a max fluid volume of 3L at 120 ml/hr  + 20% fat emulsion 240 ml/day to provide: 144 g of protein and 2568 kCals per day - Glucose infusion rate will be 3.2 mg/kg/min (Maximum 5 mg/kg/min)   PLAN  At 1800 today:  Continue Clinimix  E 5/15 at 173m/hr and continue lipids today at 270m/hr x 12 hours to provide 144 gm protein and 2525 Kcal for 100 % support  TPN to contain standard multivitamins and trace elements daily  Continue IVF at KVLovingnd moderate SSI q6h.  TPN lab panels on Mondays & Thursdays.  Monitor I/O with high volume TPN  ErPeggyann JubaPharmD, BCPS Pager: [phone]/05/2017 6:54 AM

## 2017-06-04 DIAGNOSIS — F411 Generalized anxiety disorder: Secondary | ICD-10-CM

## 2017-06-04 LAB — COMPREHENSIVE METABOLIC PANEL
ALK PHOS: 257 U/L — AB (ref 38–126)
ALT: 28 U/L (ref 17–63)
ANION GAP: 9 (ref 5–15)
AST: 35 U/L (ref 15–41)
Albumin: 2.6 g/dL — ABNORMAL LOW (ref 3.5–5.0)
BILIRUBIN TOTAL: 0.7 mg/dL (ref 0.3–1.2)
BUN: 22 mg/dL — ABNORMAL HIGH (ref 6–20)
CO2: 22 mmol/L (ref 22–32)
Calcium: 9.3 mg/dL (ref 8.9–10.3)
Chloride: 98 mmol/L — ABNORMAL LOW (ref 101–111)
Creatinine, Ser: 0.71 mg/dL (ref 0.61–1.24)
GFR calc Af Amer: >60 mL/min (ref >60)
GFR calc non Af Amer: >60 mL/min (ref >60)
GLUCOSE: 119 mg/dL — AB (ref 65–99)
POTASSIUM: 4.9 mmol/L (ref 3.5–5.1)
Sodium: 129 mmol/L — ABNORMAL LOW (ref 135–145)
TOTAL PROTEIN: 7.7 g/dL (ref 6.5–8.1)

## 2017-06-04 LAB — GLUCOSE, CAPILLARY
GLUCOSE-CAPILLARY: 132 mg/dL — AB (ref 65–99)
Glucose-Capillary: 129 mg/dL — ABNORMAL HIGH (ref 65–99)
Glucose-Capillary: 152 mg/dL — ABNORMAL HIGH (ref 65–99)
Glucose-Capillary: 130 mg/dL — ABNORMAL HIGH (ref 65–99)

## 2017-06-04 LAB — BODY FLUID CULTURE: CULTURE: NO GROWTH

## 2017-06-04 LAB — CBC WITH DIFFERENTIAL/PLATELET
BASOS ABS: 0.1 10*3/uL (ref 0.0–0.1)
Basophils Relative: 1 %
Eosinophils Absolute: 0.5 10*3/uL (ref 0.0–0.7)
Eosinophils Relative: 4 %
HEMATOCRIT: 30.7 % — AB (ref 39.0–52.0)
HEMOGLOBIN: 10.5 g/dL — AB (ref 13.0–17.0)
LYMPHS PCT: 25 %
Lymphs Abs: 3.4 10*3/uL (ref 0.7–4.0)
MCH: 27.2 pg (ref 26.0–34.0)
MCHC: 34.2 g/dL (ref 30.0–36.0)
MCV: 79.5 fL (ref 78.0–100.0)
MONO ABS: 1 10*3/uL (ref 0.1–1.0)
Monocytes Relative: 7 %
NEUTROS ABS: 8.4 10*3/uL — AB (ref 1.7–7.7)
Neutrophils Relative %: 63 %
Platelets: 780 10*3/uL — ABNORMAL HIGH (ref 150–400)
RBC: 3.86 MIL/uL — ABNORMAL LOW (ref 4.22–5.81)
RDW: 16.8 % — AB (ref 11.5–15.5)
WBC: 13.2 10*3/uL — ABNORMAL HIGH (ref 4.0–10.5)

## 2017-06-04 LAB — OSMOLALITY, URINE: OSMOLALITY UR: 788 mosm/kg (ref 300–900)

## 2017-06-04 LAB — OSMOLALITY: Osmolality: 287 mOsm/kg (ref 275–295)

## 2017-06-04 LAB — PHOSPHORUS: Phosphorus: 5.6 mg/dL — ABNORMAL HIGH (ref 2.5–4.6)

## 2017-06-04 LAB — MAGNESIUM: Magnesium: 1.8 mg/dL (ref 1.7–2.4)

## 2017-06-04 MED ORDER — TRACE MINERALS CR-CU-MN-SE-ZN 10-1000-500-60 MCG/ML IV SOLN
INTRAVENOUS | Status: AC
  Administered 2017-06-04: 17:00:00 via INTRAVENOUS
  Filled 2017-06-04 (×2): qty 2760

## 2017-06-04 NOTE — Progress Notes (Signed)
[place] NOTE   Pharmacy Consult for TPN Indication: prolonged ileus, small bowel leak, multiple bowel surgeries  Patient Measurements: Body mass index is 29.51 kg/m. Filed Weights   05/21/17 0555 05/21/17 1111 05/26/17 0430  Weight: 208 lb 8.9 oz (94.6 kg) 208 lb 8.9 oz (94.6 kg) 205 lb 11 oz (93.3 kg)   HPI: 33 yoM admitted on 7/11 for enlarging adrenal mass and planned adrenalectomy.  Pharmacy consulted to dose TPN.  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   7/22 OR:  wash out, tube ileostomy, left with open abdomen & wound vac in place.  Plan for OR on 7/25 for closure 7/25 OR: mesh placed for abdominal closure, wound vac 8/2 extubated, propofol drip off 8/4 TPN off for ~ 5 hours due to line detached from filter  Insulin requirements past 24 hours: 6 units SSI (no hx DM)  Current Nutrition: NPO, ice chips  IVF: D5 1/2NS at10 ml/hr  Central access: PICC line ordered 7/19, placed on 7/20 TPN start date: 7/20  ASSESSMENT                                                                                                           Today, 06/04/17  - Glucose: at goal (goal < 150) - Electrolytes:  Na low 129 (decreasing)  Phos high 5.6 (rising)  - Renal:  SCr wnl, stable. I/O: +173, drains 445 - Weight: most recent 7/31 93.3kg down from 94.6kg on 7/26 - GI: VAC removed, drains in - LFTs: WNL x alk phos 257, trending up;  Tbili WNL - TGs:  296 (7/18), 266 (7/20), 269 (7/23), 210 (7/28), 285 (7/31) 301 (8/3)   - Prealbumin:  <5 (7/20), 6.1 (7/24), 10.1 (7/30) 24.8  (8/6) Prealbumin now WNL NUTRITIONAL GOALS                                                                                             RD recs (8/7):  134 - 152 gms Protein (1.5 - 1.7 g/kg) 2235 - 2505 Kcal  Clinimix  E 5/15 at a max fluid volume of 3L at 120 ml/hr  + 20% fat emulsion 240 ml/day to provide: 144 g of protein and 2568 kCals per day - Glucose infusion rate will be 3.2 mg/kg/min (Maximum 5 mg/kg/min)  Clinimix 5/20 (no electrolytes) at rate of  115 ml/hr with lipids 3x/week on MWF only would provide: 138g protein and avg 2174 kcal/day - Glucose infusion rate will be 4.11 mg/kg/min (Maximum 5 mg/kg/min)   PLAN                                                                                                                          At 1800 today:  Change Clinimix to 5/20 (remove electrolytes) 115 ml/hr with no lipids today  TPN to contain standard multivitamins and trace elements daily  Continue IVF at [place] and moderate SSI q6h.  TPN lab panels on Mondays & Thursdays.  Monitor I/O with high volume TPN  Peggyann Juba, PharmD, BCPS Pager: [phone] 06/04/2017 6:58 AM

## 2017-06-04 NOTE — Progress Notes (Signed)
Physical Therapy Treatment Patient Details Name: [name] [name] MRN: [id number] DOB: 09/18/1961 Today's Date: 06/04/2017    History of Present Illness  56 y.o. 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 On 7/18, he developed a leak therefore was taken back to OR for re-do of ex-lap and repair of leak on 05/20/17.  He returned to the ICU on the vent . Extubated on 05/28/17.    PT Comments    Pt assisted with ambulating again into hallway and able to perform 27 feet!  Pt slowly progressing with mobility.  Follow Up Recommendations  SNF;Supervision/Assistance - 24 hour     Equipment Recommendations  Rolling walker with 5" wheels    Recommendations for Other Services       Precautions / Restrictions Precautions Precautions: Fall Precaution Comments: multiple lines in abdomen, VAC, NG, and JP drain    Mobility  Bed Mobility Overal bed mobility: Needs Assistance Bed Mobility: Supine to Sit     Supine to sit: Min assist;HOB elevated;+2 for safety/equipment     General bed mobility comments: assist for trunk upright, pt more awake/alert today and able to self assist better, +2 for lines  Transfers Overall transfer level: Needs assistance Equipment used: Rolling walker (2 wheeled) Transfers: Sit to/from Stand Sit to Stand: Min assist;From elevated surface;+2 physical assistance;+2 safety/equipment         General transfer comment: slight assist to rise and steady, min assist for control of descent likely due to fatigue, multimodal cues for sequence and technique  Ambulation/Gait Ambulation/Gait assistance: Mod assist;+2 physical assistance;+2 safety/equipment Ambulation Distance (Feet): 27 Feet Assistive device: Rolling walker (2 wheeled) Gait Pattern/deviations: Step-through pattern;Decreased  stride length;Narrow base of support;Scissoring     General Gait Details: verbal cues for sequencing, encouraged step to pattern as pt reports R knee pain and LE buckling, gait improved with step to pattern, recliner brought to pt as he fatigues quickly, SpO2 97% on room air however HR elevated to 137 bpm   Stairs            Wheelchair Mobility    Modified Rankin (Stroke Patients Only)       Balance                                            Cognition Arousal/Alertness: Awake/alert Behavior During Therapy: Flat affect Overall Cognitive Status: Within Functional Limits for tasks assessed                                 General Comments: Delayed responses       Exercises      General Comments        Pertinent Vitals/Pain Pain Assessment: Faces Faces Pain Scale: Hurts even more Pain Location: abdomen Pain Descriptors / Indicators: Grimacing;Sore Pain Intervention(s): PCA encouraged;Repositioned;Monitored during session;Premedicated before session    Home Living                      Prior Function            PT Goals (current goals can now be found in the care plan section) Progress towards PT goals: Progressing toward  goals    Frequency    Min 3X/week      PT Plan Current plan remains appropriate    Co-evaluation              AM-PAC PT "6 Clicks" Daily Activity  Outcome Measure  Difficulty turning over in bed (including adjusting bedclothes, sheets and blankets)?: A Little Difficulty moving from lying on back to sitting on the side of the bed? : Total Difficulty sitting down on and standing up from a chair with arms (e.g., wheelchair, bedside commode, etc,.)?: Total Help needed moving to and from a bed to chair (including a wheelchair)?: A Lot Help needed walking in hospital room?: A Lot Help needed climbing 3-5 steps with a railing? : Total 6 Click Score: 10    End of Session Equipment  Utilized During Treatment: Gait belt Activity Tolerance: Patient limited by fatigue Patient left: in chair;with call bell/phone within reach;with family/visitor present;with nursing/sitter in room Nurse Communication: Mobility status PT Visit Diagnosis: Difficulty in walking, not elsewhere classified (R26.2);Muscle weakness (generalized) (M62.81)     Time: 6226-3335 PT Time Calculation (min) (ACUTE ONLY): 27 min  Charges:  $Gait Training: 8-22 mins $Therapeutic Activity: 8-22 mins                    G Codes:      Carmelia Bake, PT, DPT 06/04/2017 Pager: [phone]  York Ram E 06/04/2017, 1:17 PM

## 2017-06-04 NOTE — Progress Notes (Signed)
Patient's wife and brother got the patient back to bed without staff help despite alarms and requests to wait for help. Patient safely back to bed no complications. Family educated on need for assistance

## 2017-06-04 NOTE — Care Management Note (Signed)
Case Management Note  Patient Details  Name: [name] [name] MRN: [id number] Date of Birth: 28-Apr-1961  Subjective/Objective:                  Wound vac changed on 080818/Extubated on 080318/WBC remains elevated/remains npo with ngtube  Action/Plan: Date:  June 04, 2017 Chart reviewed for concurrent status and case management needs. Will continue to follow patient progress. Discharge Planning: following for needs Expected discharge date: [id number] [name], BSN, [place], [place]  Expected Discharge Date:                  Expected Discharge Plan:  Home/Self Care  In-House Referral:     Discharge planning Services  CM Consult  Post Acute Care Choice:    Choice offered to:     DME Arranged:    DME Agency:     HH Arranged:    HH Agency:     Status of Service:  In process, will continue to follow  If discussed at Long Length of Stay Meetings, dates discussed:    Additional Comments:  Leeroy Cha, RN 06/04/2017, 8:37 AM

## 2017-06-04 NOTE — Progress Notes (Signed)
Progress Note: General Surgery Service   Assessment/Plan: Patient Active Problem List   Diagnosis Date Noted  . Peritonitis ([place]) 06/01/2017  . Pressure injury of skin 05/29/2017  . Mucus plugging of bronchi   . Acute respiratory failure with hypoxemia ([place])   . Ileus ([place])   . Hypokalemia 05/14/2017  . Anastomotic leak of intestine 05/14/2017  . Severe sepsis ([place]) 05/14/2017  . Wound dehiscence, surgical 05/14/2017  . Aspiration pneumonia ([place]) 05/14/2017  . Chronic narcotic use 05/10/2017  . Anxiety state 05/10/2017  . Intra-abdominal adhesions s/p SB resection 05/06/2017 05/09/2017  . H/O total adrenalectomy ([place]) 05/06/2017  . Right adrenal mass s/p adrenalectomy 05/06/2017 05/06/2017  . Throat symptom 03/18/2016  . Multinodular goiter 01/19/2016  . Hyperthyroidism 01/19/2016  . Essential hypertension   . Diarrhea 11/30/2014  . Anemia, iron deficiency 11/01/2014  . Leukocytosis 11/03/2013  . Tobacco abuse 07/26/2013  . COPD (chronic obstructive pulmonary disease) ([place]) 07/26/2013  . Left knee pain 07/26/2013  . Pain in joint, shoulder region 05/03/2013  . GERD 07/24/2008  . 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   s/p Procedure(s): ABDOMINAL WOUND [place] OUT, WOUND CLOSURE AND WOUND VAC PLACEMENT 05/20/2017  Hyponatremia -talk to pharmacy after TPN mix  Intestinal injury -continue TPN -continue tubes to drainage -continue NG tube  Anxiety- ativan PO may be only partially absorbed but does seem to have some effect on his ability to relax -continue PO ativan with intermittent IV ativan  Post operative pain -continue oxycodone per tube -continue dilaudid PCA  -labs improved with Hgb up to 10 and WBC down to 13   LOS: 29 days  Chief Complaint/Subjective: Some rest last night, able to walk to the door with assistance  Objective: Vital signs in last 24 hours: Temp:  [96.5 F (35.8 C)-98 F (36.7 C)] 97.3 F (36.3 C) (08/09 1200) Pulse Rate:   [92-117] 115 (08/09 1300) Resp:  [16-32] 28 (08/09 1200) BP: (98-132)/(60-91) 98/70 (08/09 1200) SpO2:  [91 %-100 %] 97 % (08/09 1300) Last BM Date: 06/02/17  Intake/Output from previous day: 08/08 0701 - 08/09 0700 In: 3399 [I.V.:3299; IV Piggyback:100] Out: 1610 [Urine:1550; Emesis/NG output:1250; Drains:445] Intake/Output this shift: Total I/O In: 195 [I.V.:195] Out: -   Lungs: nonlabored breathing  Cardiovascular: tachycardic  Abd: soft, vac in place and functional, drains in good position, blake drain with small amount thick secretion  Extremities: no edema  Neuro: Alert, oriented, calm  Lab Results: CBC   Recent Labs  06/03/17 0723 06/04/17 0317  WBC 16.1* 13.2*  HGB 9.7* 10.5*  HCT 29.0* 30.7*  PLT 889* 780*   BMET  Recent Labs  06/02/17 0413 06/04/17 0317  NA 132* 129*  K 4.7 4.9  CL 100* 98*  CO2 22 22  GLUCOSE 109* 119*  BUN 22* 22*  CREATININE 0.66 0.71  CALCIUM 8.9 9.3   PT/INR No results for input(s): LABPROT, INR in the last 72 hours. ABG No results for input(s): PHART, HCO3 in the last 72 hours.  Invalid input(s): PCO2, PO2  Studies/Results:  Anti-infectives: Anti-infectives    Start     Dose/Rate Route Frequency Ordered Stop   05/29/17 2200  ceFEPIme (MAXIPIME) 2 g in dextrose 5 % 50 mL IVPB  Status:  Discontinued     2 g 100 mL/hr over 30 Minutes Intravenous Every 8 hours 05/29/17 2031 06/03/17 0832   05/29/17 2100  metroNIDAZOLE (FLAGYL) IVPB 500 mg  Status:  Discontinued     500 mg 100 mL/hr over  60 Minutes Intravenous Every 8 hours 05/29/17 2029 06/03/17 0832   05/26/17 2200  ceFAZolin (ANCEF) IVPB 1 g/50 mL premix  Status:  Discontinued     1 g 100 mL/hr over 30 Minutes Intravenous Every 8 hours 05/26/17 1008 05/29/17 2023   05/25/17 1100  vancomycin (VANCOCIN) 1,250 mg in sodium chloride 0.9 % 250 mL IVPB  Status:  Discontinued     1,250 mg 166.7 mL/hr over 90 Minutes Intravenous Every 12 hours 05/25/17 0955 05/26/17  0946   05/22/17 1400  anidulafungin (ERAXIS) 100 mg in sodium chloride 0.9 % 100 mL IVPB     100 mg 78 mL/hr over 100 Minutes Intravenous Every 24 hours 05/21/17 1330 05/27/17 1740   05/21/17 1400  anidulafungin (ERAXIS) 200 mg in sodium chloride 0.9 % 200 mL IVPB     200 mg 78 mL/hr over 200 Minutes Intravenous  Once 05/21/17 1330 05/21/17 1940   05/15/17 1000  anidulafungin (ERAXIS) 100 mg in sodium chloride 0.9 % 100 mL IVPB  Status:  Discontinued     100 mg 78 mL/hr over 100 Minutes Intravenous Every 24 hours 05/14/17 0829 05/16/17 0901   05/15/17 1000  metroNIDAZOLE (FLAGYL) IVPB 500 mg  Status:  Discontinued     500 mg 100 mL/hr over 60 Minutes Intravenous Every 8 hours 05/15/17 0903 05/25/17 0938   05/14/17 0900  anidulafungin (ERAXIS) 200 mg in sodium chloride 0.9 % 200 mL IVPB     200 mg 78 mL/hr over 200 Minutes Intravenous  Once 05/14/17 0829 05/14/17 1325   05/13/17 1927  vancomycin (VANCOCIN) 1-5 GM/200ML-% IVPB    Comments:  Ward, Christa   : cabinet override      05/13/17 1927 05/14/17 0744   05/12/17 1400  ceFEPIme (MAXIPIME) 1 g in dextrose 5 % 50 mL IVPB     1 g 100 mL/hr over 30 Minutes Intravenous Every 8 hours 05/12/17 0939 05/26/17 2359   05/12/17 0900  vancomycin (VANCOCIN) IVPB 1000 mg/200 mL premix  Status:  Discontinued     1,000 mg 200 mL/hr over 60 Minutes Intravenous Every 12 hours 05/12/17 0750 05/16/17 0852   05/12/17 0830  aztreonam (AZACTAM) 2 GM IVPB     2 g 100 mL/hr over 30 Minutes Intravenous  Once 05/12/17 0800 05/12/17 0957   05/06/17 0916  vancomycin (VANCOCIN) IVPB 1000 mg/200 mL premix     1,000 mg 200 mL/hr over 60 Minutes Intravenous On call to O.R. 05/06/17 0916 05/06/17 1229      Medications: Scheduled Meds: . chlorhexidine  15 mL Mouth Rinse BID  . Chlorhexidine Gluconate Cloth  6 each Topical Q1200  . HYDROmorphone   Intravenous Q4H  . insulin aspart  0-15 Units Subcutaneous Q6H  . ipratropium-albuterol  3 mL Nebulization BID  .  lip balm  1 application Topical BID  . LORazepam  2 mg Oral Q6H  . mouth rinse  15 mL Mouth Rinse q12n4p  . nicotine  21 mg Transdermal Daily  . oxyCODONE  10 mg Per Tube Q4H  . pantoprazole (PROTONIX) IV  40 mg Intravenous Q12H  . thiamine injection  100 mg Intravenous Daily   Continuous Infusions: . dextrose 5 % and 0.45% NaCl 10 mL/hr at 06/04/17 0730  . TPN (CLINIMIX) Adult without lytes    . Marland KitchenTPN (CLINIMIX-E) Adult 120 mL/hr at 06/04/17 0730   PRN Meds:.HYDROmorphone, labetalol, LORazepam, magic mouthwash, menthol-cetylpyridinium, naloxone **AND** sodium chloride flush, ondansetron **OR** ondansetron (ZOFRAN) IV, phenol, sodium chloride flush  Lurena Joiner  Sondra Come, MD Pg# [phone] [facility]  \

## 2017-06-04 NOTE — Progress Notes (Signed)
Physical Therapy Treatment Patient Details Name: [name] [name] MRN: [id number] DOB: May 23, 1961 Today's Date: 06/04/2017    History of Present Illness  56 y.o. 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 On 7/18, he developed a leak therefore was taken back to OR for re-do of ex-lap and repair of leak on 05/20/17.  He returned to the ICU on the vent . Extubated on 05/28/17.    PT Comments    Pt difficult to awaken today however once sitting EOB more awake/alert.  Pt assisted with ambulating short distance.  Mobility limited by LE buckling.  Follow Up Recommendations  SNF;Supervision/Assistance - 24 hour     Equipment Recommendations  Rolling walker with 5" wheels    Recommendations for Other Services       Precautions / Restrictions Precautions Precautions: Fall Precaution Comments: multiple lines in abdomen, VAC, NG, and JP drain Restrictions Weight Bearing Restrictions: No    Mobility  Bed Mobility Overal bed mobility: Needs Assistance Bed Mobility: Rolling;Sidelying to Sit Rolling: Supervision Sidelying to sit: Max assist;+2 for physical assistance       General bed mobility comments: +2 for lines and assist, pt difficult to awaken so required more assist, assist to bring LEs over EOB and then trunk upright  Transfers Overall transfer level: Needs assistance Equipment used: Rolling walker (2 wheeled) Transfers: Sit to/from Stand Sit to Stand: Mod assist;+2 safety/equipment;+2 physical assistance         General transfer comment: assist to rise, steady, and control descent, multimodal cues for sequence and technique  Ambulation/Gait Ambulation/Gait assistance: Mod assist;+2 physical assistance;+2 safety/equipment Ambulation Distance (Feet): 13 Feet Assistive device: Rolling walker  (2 wheeled) Gait Pattern/deviations: Step-through pattern;Narrow base of support;Decreased stride length     General Gait Details: verbal cues for sequencing, pt with occasional LE buckling limiting distance, Recliner brought to pt, HR elevated to 136 bpm during ambulation   Stairs            Wheelchair Mobility    Modified Rankin (Stroke Patients Only)       Balance                                            Cognition Arousal/Alertness: Awake/alert Behavior During Therapy: Flat affect Overall Cognitive Status: Within Functional Limits for tasks assessed                                 General Comments: Delayed responses       Exercises      General Comments        Pertinent Vitals/Pain Pain Assessment: Faces Faces Pain Scale: Hurts even more Pain Location: abdomen Pain Descriptors / Indicators: Grimacing;Sore Pain Intervention(s): Limited activity within patient's tolerance;Repositioned;Monitored during session;Premedicated before session    Home Living                      Prior Function            PT Goals (current goals can now be found in the care plan section) Progress towards PT goals: Progressing toward goals    Frequency    Min 3X/week  PT Plan Discharge plan needs to be updated    Co-evaluation              AM-PAC PT "6 Clicks" Daily Activity  Outcome Measure  Difficulty turning over in bed (including adjusting bedclothes, sheets and blankets)?: A Little Difficulty moving from lying on back to sitting on the side of the bed? : Total Difficulty sitting down on and standing up from a chair with arms (e.g., wheelchair, bedside commode, etc,.)?: Total Help needed moving to and from a bed to chair (including a wheelchair)?: A Lot Help needed walking in hospital room?: A Lot Help needed climbing 3-5 steps with a railing? : Total 6 Click Score: 10    End of Session Equipment Utilized  During Treatment: Gait belt Activity Tolerance: Patient limited by fatigue Patient left: in chair;with call bell/phone within reach;with nursing/sitter in room Nurse Communication: Mobility status PT Visit Diagnosis: Difficulty in walking, not elsewhere classified (R26.2);Muscle weakness (generalized) (M62.81)     Time: 3785-8850 PT Time Calculation (min) (ACUTE ONLY): 30 min  Charges:                       G CodesCarmelia Bake, PT, DPT 06/04/2017 Pager: [phone]  York Ram E 06/04/2017, 8:48 AM

## 2017-06-04 NOTE — Progress Notes (Signed)
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 Patient's course was complicated by small bowel injury requiring resection with anastomoses and placement of wound VAC. On 7/18, patient developed leak and was taken back to the operating room for redo of his exploratory laparotomy and repair of leak. That time, patient returned to the ICU on the ventilator. Critical care was consulted for ventilator/medical management.  Over the next 2 weeks, patient's hospital course was complicated with the following: 07/19 - NGT pulled out and almost self-extubated. Developed cuff leak >> tube exchanged. Back to OR emergently for intestinal leak/ischemic bowel.TPN started at that time 07/22 - Back to OR for wash out >> left with open abdomen & wound vac in place. Transfused 1u PRBC. 07/25 - rising temp, WBC increased. Returned to OR for closure 07/27 - Recultured, sedation down on precedex [facility] 7/27 >> MRSE 1/2 > contaminant ? 07/30 - Remains on sedation, VAC leak over the weekend with dressing change by [facility] 07/31 - Initial attempt at weaning failed to due to tachypnea/anxiety 08/02 - Fever 101.1, WBC 14.2, weaned / extubated 8/3 - Extubated for 1 day, tolerating well. O2 weaned to 1L. Remains on dilaudid, initially on gtt now PCA. RN reports precedex started overnight for anxiety. 8/4: Ultrasound-guided aspiration By interventional radiology of small perihepatic fluid collection yielded only 1 mL of the fluid, sent for culture. No growth yet 8/6, patient improving. Off Precedex drip  since 8/4. White blood cell count staying elevated at 14-16 for past 10 days. Hemoglobin Stable for the past 2 weeks between 7-9. Still on TPN 8/8: wound vac changed per surgery. WBC 16. 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/2017 Active Problems:   GERD   Tobacco abuse   COPD (chronic obstructive pulmonary disease) (H[facility])   Essential hypertension   Hyperthyroidism   H/O total adrenalectomy ([place])   Intra-abdominal adhesions s/p SB resection 05/06/2017   Chronic narcotic use   Anxiety state   Hypokalemia   Anastomotic leak of intestine   Severe sepsis (H[facility])   Wound dehiscence, surgical   Aspiration pneumonia ([facility])   Acute respiratory failure with hypoxemia ([facility])   Ileus ([facility])   Mucus plugging of bronchi   Pressure injury of skin   Peritonitis ([place])   Sepsis -due to peritonitis and MSSA HCAP.  Patient has completed antibiotics for these as outlined below.  His white count has been overall stable in the last couple of weeks, and antibiotics were discontinued by general surgery on 8/8.  His white count today is improving some to 13.2.  Acute respiratory failure with hypoxia -Secondary to sepsis and HCAP post op adrenalectomy.  Currently he is stable on nasal cannula.  He was intubated and has been extubated since 05/28/2017.  Continue DuoNeb's.  Peritonitis/intestinal perforation/anastomotic leak -05/29/2017 CT abdomen and pelvis thick walled fluid collection along anterior and lateral aspects of the liver; extends 20 cm along anterior and lateral margins of liver.  Interventional radiology was consulted and on 05/30/2017 he underwent aspiration.  Cultures  have remained negative. -05/20/17--ABDOMINAL WOUND [place] OUT, WOUND CLOSURE AND WOUND VAC PLACEMENT.  Wound vacs changed 8/8 -NG remains in place, discussed with Dr. Alvino Blood today, patient will need to be on TPN for quite some time -Monitor off of antibiotics, discontinued on  8/8  Hyponatremia -Suspect due to TPN, also need to be very careful regarding volume status as he can be prone for fluid overload given poor mobility/TPN, will reinstate daily weights.  He seems to be up 4.5 L based on I's and O's, however they tend to become inaccurate given prolonged hospital stay.  Closely monitor volume status and daily weights.  Will obtain serum osmolality and urine osmolality for completeness.  TSH was checked on 05/14/2017 and was within normal limits  MSSA HCAP -finished 7 days of cefepime, afebrile, white count is improving, continue to closely monitor respiratory status  Acute blood loss anemia -Baseline Hgb ~12 prior to admission, currently stable  -Continue PPI -There were some concerns for coffee-ground return in his NG tube therefore he is on SCDs and his Lovenox is on hold -most recent Hb on 10.5 on 8/9.   R-adrenal mass/pheochromocytoma -s/p R-adrenalectomy, surgery was complicated by small bowel injury in the setting of adhesions with sepsis secondary to intra-abdominal infection -post-op care per surgery  Acute metabolic encephalopathy -multifactorial including sepsis, infection, electrolyte derangement, opioids/benzos -overall improving  Thrombocytosis -likely acute phase reactant and possible iron deficiency -pt had normal platelet count at time of admission  Essential Hypertension -remains stable off home clonidine, labetalol, chlorthalidone, valsartan  Chronic pain syndrome/Post-op pain -had significant post-op pain -remains on dilaudid PCA, po oxycodone scheduled, ativan scheduled -pain management per C[facility] -previously on home gaba, percocet  Hyperlipidemia -remain off lipitor until able to tolerate po reliably  Tobacco abuse -nicoderm patch  Hyperglycemia -likely stress induced and TPN -A1C was 5.6, not a diabetic -continue novolog sliding scale  DVT prophylaxis: SCDs Code Status: Full code Family Communication: no family at  bedside Disposition Plan: dispo per general surgery.  I wonder whether he is a good LTAC candidate  Procedures:   As per progress note  Antimicrobials: Vancomycin 7/17 >>>7/21; 7/30 >>8/1 Eraxis 7/19 >>> 7/21, 7/26 >> 8/1 Cefazolin 7/31>>>8/3 Cefepime 7/17 >>>7/31 ; 8/3>>>8/8 Flagyl 7/20 >>> 7/30; 8/3>>> 8/8  Subjective: -Abdominal pain seems to be a bit better today per patient.  No shortness of breath.  No chest pain.  No nausea or vomiting  Objective: Vitals:   06/04/17 0730 06/04/17 0738 06/04/17 0800 06/04/17 1112  BP: 108/86  131/78   Pulse: (!) 106  (!) 108   Resp: 16 (!) 24 (!) 22 20  Temp:   (!) 96.5 F (35.8 C)   TempSrc:   Axillary   SpO2: 95% 96% 96% 96%  Weight:      Height:        Intake/Output Summary (Last 24 hours) at 06/04/17 1122 Last data filed at 06/04/17 0730  Gross per 24 hour  Intake             3244 ml  Output             3245 ml  Net               -1 ml   Filed Weights   05/21/17 0555 05/21/17 1111 05/26/17 0430  Weight: 94.6 kg (208 lb 8.9 oz) 94.6 kg (208 lb 8.9 oz) 93.3 kg (205 lb 11 oz)    Examination:  Vitals:   06/04/17 0730 06/04/17  1275 06/04/17 0800 06/04/17 1112  BP: 108/86  131/78   Pulse: (!) 106  (!) 108   Resp: 16 (!) 24 (!) 22 20  Temp:   (!) 96.5 F (35.8 C)   TempSrc:   Axillary   SpO2: 95% 96% 96% 96%  Weight:      Height:       Constitutional: NAD Eyes: lids and conjunctivae normal Respiratory: clear to auscultation bilaterally, no wheezing, no crackles. Normal respiratory effort.  Cardiovascular: Regular rate and rhythm, no murmurs / rubs / gallops. No LE edema. 2+ pedal pulses.  Abdomen: no tenderness. Bowel sounds positive.  Drains in place, wound VAC in place Skin: no rashes, lesions, ulcers. No induration Neurologic: non focal  Data Reviewed: I have independently reviewed following labs and imaging studies   CBC:  Recent Labs Lab 05/31/17 1235 06/01/17 0543 06/02/17 0413 06/03/17 0723  06/04/17 0317  WBC 14.5* 12.6* 15.3* 16.1* 13.2*  NEUTROABS 9.7* 8.5* 9.9* 10.8* 8.4*  HGB 8.1* 10.4* 8.9* 9.7* 10.5*  HCT 24.1* 31.3* 26.1* 29.0* 30.7*  MCV 78.2 79.8 79.3 79.0 79.5  PLT 890* 742* 898* 889* 170*   Basic Metabolic Panel:  Recent Labs Lab 05/29/17 0316 05/30/17 0430 05/31/17 0315 06/01/17 0543 06/02/17 0413 06/04/17 0317  NA 134* 132* 133* 134* 132* 129*  K 3.8 3.7 4.1 4.3 4.7 4.9  CL 98* 95* 98* 99* 100* 98*  CO2 28 25 26 25 22 22   GLUCOSE 161* 113* 119* 109* 109* 119*  BUN 18 17 20 20  22* 22*  CREATININE 0.54* 0.57* 0.67 0.67 0.66 0.71  CALCIUM 8.3* 8.6* 8.4* 8.6* 8.9 9.3  MG 1.8 1.7 1.9 2.1  --  1.8  PHOS 3.1 3.8 4.6 4.5  --  5.6*   GFR: Estimated Creatinine Clearance: 118.3 mL/min (by C-G formula based on SCr of 0.71 mg/dL). Liver Function Tests:  Recent Labs Lab 06/01/17 0543 06/04/17 0317  AST 41 35  ALT 37 28  ALKPHOS 269* 257*  BILITOT 0.6 0.7  PROT 7.3 7.7  ALBUMIN 2.2* 2.6*   No results for input(s): LIPASE, AMYLASE in the last 168 hours. No results for input(s): AMMONIA in the last 168 hours. Coagulation Profile:  Recent Labs Lab 05/30/17 0900  INR 1.21   Cardiac Enzymes: No results for input(s): CKTOTAL, CKMB, CKMBINDEX, TROPONINI in the last 168 hours. BNP (last 3 results) No results for input(s): PROBNP in the last 8760 hours. HbA1C:  Recent Labs  06/02/17 0414  HGBA1C 5.6   CBG:  Recent Labs Lab 06/03/17 1217 06/03/17 1745 06/03/17 2320 06/04/17 0529 06/04/17 1106  GLUCAP 121* 117* 121* 129* 132*   Lipid Profile: No results for input(s): CHOL, HDL, LDLCALC, TRIG, CHOLHDL, LDLDIRECT in the last 72 hours. Thyroid Function Tests: No results for input(s): TSH, T4TOTAL, FREET4, T3FREE, THYROIDAB in the last 72 hours. Anemia Panel: No results for input(s): VITAMINB12, FOLATE, FERRITIN, TIBC, IRON, RETICCTPCT in the last 72 hours. Urine analysis:    Component Value Date/Time   COLORURINE AMBER (A) 05/22/2017  1300   APPEARANCEUR CLEAR 05/22/2017 1300   LABSPEC 1.021 05/22/2017 1300   PHURINE 6.0 05/22/2017 1300   GLUCOSEU NEGATIVE 05/22/2017 1300   HGBUR SMALL (A) 05/22/2017 1300   BILIRUBINUR NEGATIVE 05/22/2017 1300   KETONESUR NEGATIVE 05/22/2017 1300   PROTEINUR NEGATIVE 05/22/2017 1300   UROBILINOGEN 0.2 02/01/2014 0454   NITRITE NEGATIVE 05/22/2017 1300   LEUKOCYTESUR SMALL (A) 05/22/2017 1300   Sepsis Labs: Invalid input(s): PROCALCITONIN, LACTICIDVEN  Recent  Results (from the past 240 hour(s))  Body fluid culture     Status: None   Collection Time: 05/30/17 12:16 PM  Result Value Ref Range Status   Specimen Description FLUID ASPIRATE  Final   Special Requests SMALL PERIHEPATIC FLUID  Final   Gram Stain   Final    FEW WBC PRESENT, PREDOMINANTLY PMN NO ORGANISMS SEEN    Culture   Final    NO GROWTH 3 DAYS Performed at [facility], [address] [place], [place] [address]    Report Status 06/04/2017 FINAL  Final      Radiology Studies: No results found.   Scheduled Meds: . chlorhexidine  15 mL Mouth Rinse BID  . Chlorhexidine Gluconate Cloth  6 each Topical Q1200  . HYDROmorphone   Intravenous Q4H  . insulin aspart  0-15 Units Subcutaneous Q6H  . ipratropium-albuterol  3 mL Nebulization BID  . lip balm  1 application Topical BID  . LORazepam  2 mg Oral Q6H  . mouth rinse  15 mL Mouth Rinse q12n4p  . nicotine  21 mg Transdermal Daily  . oxyCODONE  10 mg Per Tube Q4H  . pantoprazole (PROTONIX) IV  40 mg Intravenous Q12H  . thiamine injection  100 mg Intravenous Daily   Continuous Infusions: . dextrose 5 % and 0.45% NaCl 10 mL/hr at 06/04/17 0730  . TPN (CLINIMIX) Adult without lytes    . Marland KitchenTPN (CLINIMIX-E) Adult 120 mL/hr at 06/04/17 0730    Marzetta Board, MD, PhD [facility] Pager [phone]  If 7PM-7AM, please contact night-coverage [email or web address] Password [email or web address] 06/04/2017, 11:22 AM

## 2017-06-04 NOTE — Progress Notes (Signed)
OT Cancellation Note  Patient Details Name: [name] [name] MRN: [id number] DOB: 1961/06/11   Cancelled Treatment:    Reason Eval/Treat Not Completed: Fatigue/lethargy limiting ability to participate.  Pt resting and wife reports that he just started to rest.  He worked with PT earlier. Will return tomorrow, if schedule permits.    Christella App 06/04/2017, 3:28 PM  [name], OTR/L [phone] 06/04/2017

## 2017-06-05 LAB — COMPREHENSIVE METABOLIC PANEL
ALBUMIN: 2.6 g/dL — AB (ref 3.5–5.0)
ALT: 25 U/L (ref 17–63)
ANION GAP: 10 (ref 5–15)
AST: 31 U/L (ref 15–41)
Alkaline Phosphatase: 245 U/L — ABNORMAL HIGH (ref 38–126)
BILIRUBIN TOTAL: 0.8 mg/dL (ref 0.3–1.2)
BUN: 26 mg/dL — ABNORMAL HIGH (ref 6–20)
CALCIUM: 9.3 mg/dL (ref 8.9–10.3)
CO2: 22 mmol/L (ref 22–32)
Chloride: 97 mmol/L — ABNORMAL LOW (ref 101–111)
Creatinine, Ser: 0.68 mg/dL (ref 0.61–1.24)
Glucose, Bld: 135 mg/dL — ABNORMAL HIGH (ref 65–99)
Potassium: 4.5 mmol/L (ref 3.5–5.1)
Sodium: 129 mmol/L — ABNORMAL LOW (ref 135–145)
TOTAL PROTEIN: 7.7 g/dL (ref 6.5–8.1)

## 2017-06-05 LAB — GLUCOSE, CAPILLARY
GLUCOSE-CAPILLARY: 148 mg/dL — AB (ref 65–99)
GLUCOSE-CAPILLARY: 145 mg/dL — AB (ref 65–99)
GLUCOSE-CAPILLARY: 140 mg/dL — AB (ref 65–99)
Glucose-Capillary: 122 mg/dL — ABNORMAL HIGH (ref 65–99)

## 2017-06-05 LAB — PHOSPHORUS: Phosphorus: 4.9 mg/dL — ABNORMAL HIGH (ref 2.5–4.6)

## 2017-06-05 LAB — MAGNESIUM: MAGNESIUM: 1.6 mg/dL — AB (ref 1.7–2.4)

## 2017-06-05 MED ORDER — TRACE MINERALS CR-CU-MN-SE-ZN 10-1000-500-60 MCG/ML IV SOLN
INTRAVENOUS | Status: AC
  Administered 2017-06-05: 18:00:00 via INTRAVENOUS
  Filled 2017-06-05 (×2): qty 1992

## 2017-06-05 MED ORDER — DIPHENHYDRAMINE HCL 50 MG/ML IJ SOLN
12.5000 mg | Freq: Four times a day (QID) | INTRAMUSCULAR | Status: DC | PRN
  Administered 2017-06-05 – 2017-06-30 (×9): 12.5 mg via INTRAVENOUS
  Filled 2017-06-05 (×9): qty 1

## 2017-06-05 MED ORDER — FAT EMULSION 20 % IV EMUL
240.0000 mL | INTRAVENOUS | Status: AC
  Administered 2017-06-05: 240 mL via INTRAVENOUS
  Filled 2017-06-05: qty 250

## 2017-06-05 MED ORDER — MAGNESIUM SULFATE 2 GM/50ML IV SOLN
2.0000 g | Freq: Once | INTRAVENOUS | Status: AC
  Administered 2017-06-05: 2 g via INTRAVENOUS
  Filled 2017-06-05: qty 50

## 2017-06-05 NOTE — Progress Notes (Signed)
Progress Note: General Surgery Service   Assessment/Plan: Patient Active Problem List   Diagnosis Date Noted  . Peritonitis ([place]) 06/01/2017  . Pressure injury of skin 05/29/2017  . Mucus plugging of bronchi   . Acute respiratory failure with hypoxemia ([place])   . Ileus ([place])   . Hypokalemia 05/14/2017  . Anastomotic leak of intestine 05/14/2017  . Severe sepsis ([place]) 05/14/2017  . Wound dehiscence, surgical 05/14/2017  . Aspiration pneumonia ([place]) 05/14/2017  . Chronic narcotic use 05/10/2017  . Anxiety state 05/10/2017  . Intra-abdominal adhesions s/p SB resection 05/06/2017 05/09/2017  . H/O total adrenalectomy ([place]) 05/06/2017  . Right adrenal mass s/p adrenalectomy 05/06/2017 05/06/2017  . Throat symptom 03/18/2016  . Multinodular goiter 01/19/2016  . Hyperthyroidism 01/19/2016  . Essential hypertension   . Diarrhea 11/30/2014  . Anemia, iron deficiency 11/01/2014  . Leukocytosis 11/03/2013  . Tobacco abuse 07/26/2013  . COPD (chronic obstructive pulmonary disease) ([place]) 07/26/2013  . Left knee pain 07/26/2013  . Pain in joint, shoulder region 05/03/2013  . GERD 07/24/2008  . 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   s/p Procedure(s): ABDOMINAL WOUND [place] OUT, WOUND CLOSURE AND WOUND VAC PLACEMENT 05/20/2017  Hyponatremia -if phosphorus improved, will decrease TPN to 2L a day  Intestinal injury -continue TPN -continue tubes to drainage -continue NG tube  Anxiety- ativan PO may be only partially absorbed but does seem to have some effect on his ability to relax -continue PO ativan with intermittent IV ativan  Post operative pain -continue oxycodone per tube -continue dilaudid PCA    LOS: 30 days  Chief Complaint/Subjective: Some anxiety and difficulty sleeping overnight, only requested PRN ativan 2x yesterday  Objective: Vital signs in last 24 hours: Temp:  [97.3 F (36.3 C)-98.9 F (37.2 C)] 98 F (36.7 C) (08/10 0759) Pulse Rate:  [98-129]  98 (08/10 0700) Resp:  [16-28] 18 (08/10 0730) BP: (98-132)/(48-83) 120/80 (08/10 0600) SpO2:  [88 %-100 %] 100 % (08/10 0730) Weight:  [81.1 kg (178 lb 12.7 oz)] 81.1 kg (178 lb 12.7 oz) (08/10 0730) Last BM Date: 06/02/17  Intake/Output from previous day: 08/09 0701 - 08/10 0700 In: 2724.5 [I.V.:2724.5] Out: 2645 [Urine:1050; Emesis/NG output:1300; Drains:295] Intake/Output this shift: Total I/O In: 355.9 [I.V.:355.9] Out: -   Lungs: productive cough, nonlabored breathing  Cardiovascular: tachycardic  Abd: wound changed bed was 5cm by 23cm with decreased width medially and healthy granulation tissue. Drained in position, minimal drainage, blake drain fluid changed to thin green fluid  Extremities: no edema  Neuro: tired, asks and answers questions appropriately  Lab Results: CBC   Recent Labs  06/03/17 0723 06/04/17 0317  WBC 16.1* 13.2*  HGB 9.7* 10.5*  HCT 29.0* 30.7*  PLT 889* 780*   BMET  Recent Labs  06/04/17 0317 06/05/17 0308  NA 129* 129*  K 4.9 4.5  CL 98* 97*  CO2 22 22  GLUCOSE 119* 135*  BUN 22* 26*  CREATININE 0.71 0.68  CALCIUM 9.3 9.3   PT/INR No results for input(s): LABPROT, INR in the last 72 hours. ABG No results for input(s): PHART, HCO3 in the last 72 hours.  Invalid input(s): PCO2, PO2  Studies/Results:  Anti-infectives: Anti-infectives    Start     Dose/Rate Route Frequency Ordered Stop   05/29/17 2200  ceFEPIme (MAXIPIME) 2 g in dextrose 5 % 50 mL IVPB  Status:  Discontinued     2 g 100 mL/hr over 30 Minutes Intravenous Every 8 hours 05/29/17 2031 06/03/17 6546  05/29/17 2100  metroNIDAZOLE (FLAGYL) IVPB 500 mg  Status:  Discontinued     500 mg 100 mL/hr over 60 Minutes Intravenous Every 8 hours 05/29/17 2029 06/03/17 0832   05/26/17 2200  ceFAZolin (ANCEF) IVPB 1 g/50 mL premix  Status:  Discontinued     1 g 100 mL/hr over 30 Minutes Intravenous Every 8 hours 05/26/17 1008 05/29/17 2023   05/25/17 1100  vancomycin  (VANCOCIN) 1,250 mg in sodium chloride 0.9 % 250 mL IVPB  Status:  Discontinued     1,250 mg 166.7 mL/hr over 90 Minutes Intravenous Every 12 hours 05/25/17 0955 05/26/17 0946   05/22/17 1400  anidulafungin (ERAXIS) 100 mg in sodium chloride 0.9 % 100 mL IVPB     100 mg 78 mL/hr over 100 Minutes Intravenous Every 24 hours 05/21/17 1330 05/27/17 1740   05/21/17 1400  anidulafungin (ERAXIS) 200 mg in sodium chloride 0.9 % 200 mL IVPB     200 mg 78 mL/hr over 200 Minutes Intravenous  Once 05/21/17 1330 05/21/17 1940   05/15/17 1000  anidulafungin (ERAXIS) 100 mg in sodium chloride 0.9 % 100 mL IVPB  Status:  Discontinued     100 mg 78 mL/hr over 100 Minutes Intravenous Every 24 hours 05/14/17 0829 05/16/17 0901   05/15/17 1000  metroNIDAZOLE (FLAGYL) IVPB 500 mg  Status:  Discontinued     500 mg 100 mL/hr over 60 Minutes Intravenous Every 8 hours 05/15/17 0903 05/25/17 0938   05/14/17 0900  anidulafungin (ERAXIS) 200 mg in sodium chloride 0.9 % 200 mL IVPB     200 mg 78 mL/hr over 200 Minutes Intravenous  Once 05/14/17 0829 05/14/17 1325   05/13/17 1927  vancomycin (VANCOCIN) 1-5 GM/200ML-% IVPB    Comments:  Ward, Christa   : cabinet override      05/13/17 1927 05/14/17 0744   05/12/17 1400  ceFEPIme (MAXIPIME) 1 g in dextrose 5 % 50 mL IVPB     1 g 100 mL/hr over 30 Minutes Intravenous Every 8 hours 05/12/17 0939 05/26/17 2359   05/12/17 0900  vancomycin (VANCOCIN) IVPB 1000 mg/200 mL premix  Status:  Discontinued     1,000 mg 200 mL/hr over 60 Minutes Intravenous Every 12 hours 05/12/17 0750 05/16/17 0852   05/12/17 0830  aztreonam (AZACTAM) 2 GM IVPB     2 g 100 mL/hr over 30 Minutes Intravenous  Once 05/12/17 0800 05/12/17 0957   05/06/17 0916  vancomycin (VANCOCIN) IVPB 1000 mg/200 mL premix     1,000 mg 200 mL/hr over 60 Minutes Intravenous On call to O.R. 05/06/17 0916 05/06/17 1229      Medications: Scheduled Meds: . chlorhexidine  15 mL Mouth Rinse BID  . Chlorhexidine  Gluconate Cloth  6 each Topical Q1200  . HYDROmorphone   Intravenous Q4H  . insulin aspart  0-15 Units Subcutaneous Q6H  . ipratropium-albuterol  3 mL Nebulization BID  . lip balm  1 application Topical BID  . LORazepam  2 mg Oral Q6H  . mouth rinse  15 mL Mouth Rinse q12n4p  . nicotine  21 mg Transdermal Daily  . oxyCODONE  10 mg Per Tube Q4H  . pantoprazole (PROTONIX) IV  40 mg Intravenous Q12H  . thiamine injection  100 mg Intravenous Daily   Continuous Infusions: . dextrose 5 % and 0.45% NaCl 10 mL/hr at 06/05/17 0730  . TPN (CLINIMIX) Adult without lytes 115 mL/hr at 06/05/17 0730   PRN Meds:.HYDROmorphone, labetalol, LORazepam, magic mouthwash, menthol-cetylpyridinium, naloxone **AND**  sodium chloride flush, ondansetron **OR** ondansetron (ZOFRAN) IV, phenol, sodium chloride flush  Mickeal Skinner, MD Pg# [phone] [facility]

## 2017-06-05 NOTE — Progress Notes (Signed)
Nutrition Follow-up  DOCUMENTATION CODES:   Not applicable  INTERVENTION:  - Continue TPN per Pharmacy. - RD will continue to monitor for TPN-related plan and ability to advance to at least CLD.  - Updated estimated nutrition needs this AM.   NUTRITION DIAGNOSIS:   Inadequate oral intake related to inability to eat as evidenced by NPO status. -ongoing  GOAL:   Patient will meet greater than or equal to 90% of their needs -met with TPN regimen.   MONITOR:   Weight trends, Labs, Skin, I & O's, Other (Comment) (TPN regimen)  ASSESSMENT:   56 y.o. 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 On 7/18, he developed a leak therefore was taken back to OR for re-do of ex-lap and repair of leak.  Significant events: 7/11 OR: right adrenalectomy with complication of intestinal injury and small bowel resection with anastomosis  7/18 OR: repair of small bowel anastomotic leak, abdominal closure of wound dehisence 7/19 OR: reopening of recent laparotomy, lysis of adhesions, noted ischemic perforation of new loop of intestine, intact repair of previous anastomotic leak repair, intact previous anastomosis.  7/22 OR: wash out, tube ileostomy, left with open abdomen &wound vac in place. Plan for OR on 7/25 for closure 7/25 OR: mesh placed for abdominal closure, wound vac 8/2 extubated, propofol drip off 8/4 TPN off for ~ 5 hours due to line detached from filter. Ultrasound-guided aspiration By interventional radiology of small perihepatic fluid collection yielded only 1 mL of the fluid, sent for culture. NPO - mild coffee ground on NG but no active bleed 8/6 patient improving. Off Precedex drip since 8/4. Dilaudid Drip being changed over to PCA. Still with some coffee ground-looking material in NG tube.  8/10  hyponatremia and plan to decrease TPN from 3L/day to 2L/day x1 week   Spoke with Pharmacist who states that she spoke with Dr. Reece Agar earlier this AM and he is concerned about hyponatremia being caused by volume overload and requests decreasing TPN from 3L/day to 2L/day x1 week. Reassessment to occur in 1 week concerning TPN rate. Dr. Reece Agar encouraged Pharmacist to maintain lytes in TPN despite hyperphosphatemia. Weight -12.2 kg/27 lb since 7/31 so this RD updated estimated nutrition needs accordingly; previously using weight of 89.4 kg to estimate needs as this was an average of weight, which had been stable, from 03/06/15-05/04/17.  Pharmacist reported that Dr. Reece Agar stated that abdominal wound is healing well. He reported no plan for diet advancement in the immediate future. Per Pharmacy note this AM, plan for Clinimix E 5/15 @ 83 mL/hr (2L/day) with 20% ILE @ 20 mL/hr x12 hours. This regimen will provide 100 grams of protein (82% minimum estimated protein need) and 1894 kcal (93% minimum estimated kcal need). NGT remains to LIS with 350cc output during night shift.   Medications reviewed; sliding scale Novolog, 2 g IV Mg sulfate x1 run today, 40 mg IV Protonix BID, 100 mg IV thiamine/day. Labs reviewed; CBG: 148 mg/dL this AM, Na: 129 mmol/L, Cl: 97 mmol/L, BUN: 26 mg/dL, Phos: 4.9 mg/dL, Mg: 1.6 mg/dL, Alk Phos elevated but trending down.   IVF: D5-1/2 NS @ 10 mL/hr (41 kcal from dextrose).     Diet Order:  Diet NPO time specified Except for: Ice Chips TPN (CLINIMIX) Adult without lytes TPN (CLINIMIX-E) Adult  Skin:  Wound (see comment) (  Incisions to R flank and abdomen from 7/11 and 7/18)  Last BM:  8/7 (via ileostomy)  Height:   Ht Readings from Last 1 Encounters:  05/26/17 '5\' 10"'  (1.778 m)    Weight:   Wt Readings from Last 1 Encounters:  06/05/17 178 lb 12.7 oz (81.1 kg)    Ideal Body Weight:  75.45 kg  BMI:  Body mass index is 25.65 kg/m.  Estimated Nutritional  Needs:   Kcal:  2030-2270 (25-28 kcal/kg)  Protein:  122-138 grams (1.5-1.7 grams/kg)  Fluid:  2 L/day  EDUCATION NEEDS:   No education needs identified at this time    Jarome Matin, MS, RD, LDN, CNSC Inpatient Clinical Dietitian Pager # [phone] After hours/weekend pager # [phone]

## 2017-06-05 NOTE — Progress Notes (Signed)
CSW is available to assist with dc planning. CSW has made several unsuccessful attempts to meet with pt to review PT recommendations for SNF placement. CSW will ask weekend CSW to see pt to assist with dc planning.  Werner Lean LCSW [phone]

## 2017-06-05 NOTE — Progress Notes (Signed)
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 Patient's course was complicated by small bowel injury requiring resection with anastomoses and placement of wound VAC. On 7/18, patient developed leak and was taken back to the operating room for redo of his exploratory laparotomy and repair of leak. That time, patient returned to the ICU on the ventilator. Critical care was consulted for ventilator/medical management.  Over the next 2 weeks, patient's hospital course was complicated with the following: 07/19 - NGT pulled out and almost self-extubated. Developed cuff leak >> tube exchanged. Back to OR emergently for intestinal leak/ischemic bowel.TPN started at that time 07/22 - Back to OR for wash out >> left with open abdomen & wound vac in place. Transfused 1u PRBC. 07/25 - rising temp, WBC increased. Returned to OR for closure 07/27 - Recultured, sedation down on precedex [facility] 7/27 >> MRSE 1/2 > contaminant ? 07/30 - Remains on sedation, VAC leak over the weekend with dressing change by [facility] 07/31 - Initial attempt at weaning failed to due to tachypnea/anxiety 08/02 - Fever 101.1, WBC 14.2, weaned / extubated 8/3 - Extubated for 1 day, tolerating well. O2 weaned to 1L. Remains on dilaudid, initially on gtt now PCA. RN reports precedex started overnight for anxiety. 8/4: Ultrasound-guided aspiration By interventional radiology of small perihepatic fluid collection yielded only 1 mL of the fluid, sent for culture. No growth yet 8/6, patient improving. Off Precedex drip  since 8/4. White blood cell count staying elevated at 14-16 for past 10 days. Hemoglobin Stable for the past 2 weeks between 7-9. Still on TPN 8/8: wound vac changed per surgery. WBC 16. 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/2017 Active Problems:   GERD   Tobacco abuse   COPD (chronic obstructive pulmonary disease) ([facility])   Essential hypertension   Hyperthyroidism   H/O total adrenalectomy ([place])   Intra-abdominal adhesions s/p SB resection 05/06/2017   Chronic narcotic use   Anxiety state   Hypokalemia   Anastomotic leak of intestine   Severe sepsis ([facility])   Wound dehiscence, surgical   Aspiration pneumonia ([facility])   Acute respiratory failure with hypoxemia ([facility])   Ileus ([facility])   Mucus plugging of bronchi   Pressure injury of skin   Peritonitis ([place])   Sepsis -due to peritonitis and MSSA HCAP.  Patient has completed antibiotics for these as outlined below.  His white count has been overall stable in the last couple of weeks, and antibiotics were discontinued by general surgery on 8/8.  His white count today is improving some to 13.2.  Acute respiratory failure with hypoxia -Secondary to sepsis and HCAP post op adrenalectomy.  Currently he is stable on nasal cannula.  He was intubated and has been extubated since 05/28/2017.  Continue DuoNeb's.  Peritonitis/intestinal perforation/anastomotic leak -05/29/2017 CT abdomen and pelvis thick walled fluid collection along anterior and lateral aspects of the liver; extends 20 cm along anterior and lateral margins of liver.  Interventional radiology was consulted and on 05/30/2017 he underwent aspiration.  Cultures  have remained negative. -05/20/17--ABDOMINAL WOUND [place] OUT, WOUND CLOSURE AND WOUND VAC PLACEMENT.  Wound vacs changed 8/8 -NG remains in place, discussed with Dr. Alvino Blood today, patient will need to be on TPN for quite some time -Monitor off of antibiotics, discontinued on  8/8  Hyponatremia -Suspect due to TPN, also need to be very careful regarding volume status as he can be prone for fluid overload given poor mobility/TPN, will reinstate daily weights. -today's weight 178 from 205 11 days ago ?? real -osmolalities OK, no fluid overload on clinical exam.  -continue to monitor, Na stable at 129 today  -TSH normal  MSSA HCAP -finished 7 days of cefepime, afebrile, white count is improving, stable respiratory status  Acute blood loss anemia -Baseline Hgb ~12 prior to admission, currently ~ 10. Stable -Continue PPI -There were some concerns for coffee-ground return in his NG tube therefore he is on SCDs and his Lovenox is on hold -most recent Hb on 10.5 on 8/9.   R-adrenal mass/pheochromocytoma -s/p R-adrenalectomy, surgery was complicated by small bowel injury in the setting of adhesions with sepsis secondary to intra-abdominal infection -post-op care per surgery  Acute metabolic encephalopathy -multifactorial including sepsis, infection, electrolyte derangement, opioids/benzos -overall improving  Thrombocytosis -likely acute phase reactant and possible iron deficiency -pt had normal platelet count at time of admission  Essential Hypertension -remains stable off home clonidine, labetalol, chlorthalidone, valsartan  Chronic pain syndrome/Post-op pain -had significant post-op pain -remains on dilaudid PCA, po oxycodone scheduled, ativan scheduled -pain management per CCS -previously on home gaba, percocet  Hyperlipidemia -remain off lipitor until able to tolerate po reliably  Tobacco abuse -nicoderm patch  Hyperglycemia -likely stress induced and TPN -A1C was 5.6, not a diabetic -continue novolog sliding scale   DVT prophylaxis: SCDs Code Status: Full code Family Communication: no family at bedside Disposition Plan: dispo per general surgery  Procedures:   As per progress note  Antimicrobials: Vancomycin 7/17 >>>7/21; 7/30  >>8/1 Eraxis 7/19 >>> 7/21, 7/26 >> 8/1 Cefazolin 7/31>>>8/3 Cefepime 7/17 >>>7/31 ; 8/3>>>8/8 Flagyl 7/20 >>> 7/30; 8/3>>> 8/8  Subjective: - no chest pain, shortness of breath, no abdominal pain, nausea or vomiting.   Objective: Vitals:   06/05/17 0730 06/05/17 0759 06/05/17 1149 06/05/17 1156  BP:      Pulse:      Resp: 18  (!) 23   Temp:  98 F (36.7 C)  98.5 F (36.9 C)  TempSrc:  Oral  Oral  SpO2: 100%  98%   Weight: 81.1 kg (178 lb 12.7 oz)     Height:        Intake/Output Summary (Last 24 hours) at 06/05/17 1248 Last data filed at 06/05/17 0730  Gross per 24 hour  Intake           2885.4 ml  Output             2645 ml  Net            240.4 ml   Filed Weights   05/21/17 1111 05/26/17 0430 06/05/17 0730  Weight: 94.6 kg (208 lb 8.9 oz) 93.3 kg (205 lb 11 oz) 81.1 kg (178 lb 12.7 oz)    Examination:  Vitals:   06/05/17 0730 06/05/17 0759 06/05/17 1149 06/05/17 1156  BP:      Pulse:      Resp: 18  (!) 23   Temp:  98 F (36.7 C)  98.5 F (36.9 C)  TempSrc:  Oral  Oral  SpO2: 100%  98%   Weight: 81.1 kg (178 lb 12.7 oz)     Height:       Constitutional: NAD Eyes: lids and conjunctivae normal ENMT: Mucous membranes are moist.  Neck: normal, supple Respiratory: clear to auscultation bilaterally, no wheezing, no crackles.  Cardiovascular: Regular rate and rhythm, no murmurs / rubs / gallops.  Abdomen: no tenderness. Drains/wound vac in place Neurologic: non focal  Data Reviewed: I have independently reviewed following labs and imaging studies   CBC:  Recent Labs Lab 05/31/17 1235 06/01/17 0543 06/02/17 0413 06/03/17 0723 06/04/17 0317  WBC 14.5* 12.6* 15.3* 16.1* 13.2*  NEUTROABS 9.7* 8.5* 9.9* 10.8* 8.4*  HGB 8.1* 10.4* 8.9* 9.7* 10.5*  HCT 24.1* 31.3* 26.1* 29.0* 30.7*  MCV 78.2 79.8 79.3 79.0 79.5  PLT 890* 742* 898* 889* 244*   Basic Metabolic Panel:  Recent Labs Lab 05/30/17 0430 05/31/17 0315 06/01/17 0543 06/02/17 0413  06/04/17 0317 06/05/17 0308 06/05/17 0334  NA 132* 133* 134* 132* 129* 129*  --   K 3.7 4.1 4.3 4.7 4.9 4.5  --   CL 95* 98* 99* 100* 98* 97*  --   CO2 25 26 25 22 22 22   --   GLUCOSE 113* 119* 109* 109* 119* 135*  --   BUN 17 20 20  22* 22* 26*  --   CREATININE 0.57* 0.67 0.67 0.66 0.71 0.68  --   CALCIUM 8.6* 8.4* 8.6* 8.9 9.3 9.3  --   MG 1.7 1.9 2.1  --  1.8  --  1.6*  PHOS 3.8 4.6 4.5  --  5.6*  --  4.9*   GFR: Estimated Creatinine Clearance: 106.5 mL/min (by C-G formula based on SCr of 0.68 mg/dL). Liver Function Tests:  Recent Labs Lab 06/01/17 0543 06/04/17 0317 06/05/17 0308  AST 41 35 31  ALT 37 28 25  ALKPHOS 269* 257* 245*  BILITOT 0.6 0.7 0.8  PROT 7.3 7.7 7.7  ALBUMIN 2.2* 2.6* 2.6*   No results for input(s): LIPASE, AMYLASE in the last 168 hours. No results for input(s): AMMONIA in the last 168 hours. Coagulation Profile:  Recent Labs Lab 05/30/17 0900  INR 1.21   Cardiac Enzymes: No results for input(s): CKTOTAL, CKMB, CKMBINDEX, TROPONINI in the last 168 hours. BNP (last 3 results) No results for input(s): PROBNP in the last 8760 hours. HbA1C: No results for input(s): HGBA1C in the last 72 hours. CBG:  Recent Labs Lab 06/04/17 1106 06/04/17 1736 06/04/17 2312 06/05/17 0546 06/05/17 1146  GLUCAP 132* 152* 130* 148* 145*   Lipid Profile: No results for input(s): CHOL, HDL, LDLCALC, TRIG, CHOLHDL, LDLDIRECT in the last 72 hours. Thyroid Function Tests: No results for input(s): TSH, T4TOTAL, FREET4, T3FREE, THYROIDAB in the last 72 hours. Anemia Panel: No results for input(s): VITAMINB12, FOLATE, FERRITIN, TIBC, IRON, RETICCTPCT in the last 72 hours. Urine analysis:    Component Value Date/Time   COLORURINE AMBER (A) 05/22/2017 1300   APPEARANCEUR CLEAR 05/22/2017 1300   LABSPEC 1.021 05/22/2017 1300   PHURINE 6.0 05/22/2017 1300   GLUCOSEU NEGATIVE 05/22/2017 1300   HGBUR SMALL (A) 05/22/2017 1300   BILIRUBINUR NEGATIVE 05/22/2017  1300   KETONESUR NEGATIVE 05/22/2017 1300   PROTEINUR NEGATIVE 05/22/2017 1300   UROBILINOGEN 0.2 02/01/2014 0454   NITRITE NEGATIVE 05/22/2017 1300   LEUKOCYTESUR SMALL (A) 05/22/2017 1300   Sepsis Labs: Invalid input(s): PROCALCITONIN, LACTICIDVEN  Recent Results (from the past 240 hour(s))  Body fluid culture     Status: None  Collection Time: 05/30/17 12:16 PM  Result Value Ref Range Status   Specimen Description FLUID ASPIRATE  Final   Special Requests SMALL PERIHEPATIC FLUID  Final   Gram Stain   Final    FEW WBC PRESENT, PREDOMINANTLY PMN NO ORGANISMS SEEN    Culture   Final    NO GROWTH 3 DAYS Performed at [facility], [place] [address] [place], [place] [address]    Report Status 06/04/2017 FINAL  Final      Radiology Studies: No results found.   Scheduled Meds: . chlorhexidine  15 mL Mouth Rinse BID  . Chlorhexidine Gluconate Cloth  6 each Topical Q1200  . HYDROmorphone   Intravenous Q4H  . insulin aspart  0-15 Units Subcutaneous Q6H  . ipratropium-albuterol  3 mL Nebulization BID  . lip balm  1 application Topical BID  . LORazepam  2 mg Oral Q6H  . mouth rinse  15 mL Mouth Rinse q12n4p  . nicotine  21 mg Transdermal Daily  . oxyCODONE  10 mg Per Tube Q4H  . pantoprazole (PROTONIX) IV  40 mg Intravenous Q12H  . thiamine injection  100 mg Intravenous Daily   Continuous Infusions: . dextrose 5 % and 0.45% NaCl 10 mL/hr at 06/05/17 0730  . Marland KitchenTPN (CLINIMIX-E) Adult     And  . fat emulsion    . TPN (CLINIMIX) Adult without lytes 115 mL/hr at 06/05/17 0730    Marzetta Board, MD, PhD [facility] Pager [phone]  If 7PM-7AM, please contact night-coverage [email or web address] Password [email or web address] 06/05/2017, 12:48 PM

## 2017-06-05 NOTE — Progress Notes (Signed)
[place] NOTE   Pharmacy Consult for TPN Indication: prolonged ileus, small bowel leak, multiple bowel surgeries  Patient Measurements: Body mass index is 25.65 kg/m. Filed Weights   05/21/17 1111 05/26/17 0430 06/05/17 0730  Weight: 208 lb 8.9 oz (94.6 kg) 205 lb 11 oz (93.3 kg) 178 lb 12.7 oz (81.1 kg)   HPI: 12 yoM admitted on 7/11 for enlarging adrenal mass and planned adrenalectomy.  Pharmacy consulted to dose TPN.  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   7/22 OR:  wash out, tube ileostomy, left with open abdomen & wound vac in place.  Plan for OR on 7/25 for closure 7/25 OR: mesh placed for abdominal closure, wound vac 8/2 extubated, propofol drip off 8/4 TPN off for ~ 5 hours due to line detached from filter  Insulin requirements past 24 hours: 11 units SSI (no hx DM)  Current Nutrition: NPO, ice chips  IVF: D5 1/2NS at10 ml/hr  Central access: PICC line ordered 7/19, placed on 7/20 TPN start date: 7/20  ASSESSMENT                                                                                                           Today, 06/05/17  - Glucose: at goal (goal < 150) - Electrolytes:  Na low 129 (decreasing)  Phos high 4.9 (elevated, improved from yesterday)  - Renal:  SCr wnl, stable. I/O: +79.5, drains 295 - Weight: 81.1kg down from 93.3kg on 7/31 - GI: VAC removed, drains in - LFTs: WNL x alk phos 245, trending down;  Tbili WNL - TGs:  296 (7/18), 266 (7/20), 269 (7/23), 210 (7/28), 285 (7/31) 301 (8/3)   - Prealbumin:  <5 (7/20), 6.1 (7/24), 10.1  (7/30) 24.8 (8/6) Prealbumin now WNL  NUTRITIONAL GOALS                                                                                             RD recs (8/7):  134 - 152 gms Protein (1.5 - 1.7 g/kg) 2235 - 2505 Kcal  Clinimix  E 5/15 at a max fluid volume of 3L at 120 ml/hr  + 20% fat emulsion 240 ml/day to provide: 144 g of protein and 2568 kCals per day - Glucose infusion rate will be 3.2 mg/kg/min (Maximum 5 mg/kg/min)  Clinimix 5/20 (no electrolytes) at rate  of 115 ml/hr with lipids 3x/week on MWF only would provide: 138g protein and avg 2174 kcal/day - Glucose infusion rate will be 4.11 mg/kg/min (Maximum 5 mg/kg/min)   PLAN                                                                                                                          Discussed with Dr. Kieth Brightly today - concerned about hyponatremia, possibly due to high-volume TPN. Requested to decrease rate for a short time (~1 week) and monitor sodium levels closely. Understands that this will decrease the amount of protein the patient will be receiving for the time being but prealbumin has improved and wound is healing. Also wants to add back electrolytes since phosphorus has improved today - will monitor this closely as well.  At 1800 today:  Change Clinimix to E 5/15 (adding back electrolytes), decrease rate to 83 ml/hr (~2L volume per day) ? Lipids at 45ms/hr x 12 hours   TPN to contain standard multivitamins and trace elements daily  Continue [place]F at [place] SSI q6h.  TPN lab panels on Mondays & Thursdays.  CMET, Mg & Phos in AM  EPeggyann Juba PharmD, BCPS Pager: [phone]/07/2017 8:46 AM

## 2017-06-06 LAB — COMPREHENSIVE METABOLIC PANEL
ALK PHOS: 259 U/L — AB (ref 38–126)
ALT: 31 U/L (ref 17–63)
ANION GAP: 9 (ref 5–15)
AST: 35 U/L (ref 15–41)
Albumin: 2.9 g/dL — ABNORMAL LOW (ref 3.5–5.0)
BUN: 31 mg/dL — ABNORMAL HIGH (ref 6–20)
CALCIUM: 9.7 mg/dL (ref 8.9–10.3)
CO2: 24 mmol/L (ref 22–32)
CREATININE: 0.82 mg/dL (ref 0.61–1.24)
Chloride: 96 mmol/L — ABNORMAL LOW (ref 101–111)
Glucose, Bld: 132 mg/dL — ABNORMAL HIGH (ref 65–99)
Potassium: 4.5 mmol/L (ref 3.5–5.1)
SODIUM: 129 mmol/L — AB (ref 135–145)
TOTAL PROTEIN: 8.4 g/dL — AB (ref 6.5–8.1)
Total Bilirubin: 1 mg/dL (ref 0.3–1.2)

## 2017-06-06 LAB — GLUCOSE, CAPILLARY
GLUCOSE-CAPILLARY: 130 mg/dL — AB (ref 65–99)
GLUCOSE-CAPILLARY: 138 mg/dL — AB (ref 65–99)
Glucose-Capillary: 131 mg/dL — ABNORMAL HIGH (ref 65–99)

## 2017-06-06 LAB — MAGNESIUM: MAGNESIUM: 1.9 mg/dL (ref 1.7–2.4)

## 2017-06-06 MED ORDER — TRACE MINERALS CR-CU-MN-SE-ZN 10-1000-500-60 MCG/ML IV SOLN
INTRAVENOUS | Status: AC
  Administered 2017-06-06: 17:00:00 via INTRAVENOUS
  Filled 2017-06-06 (×2): qty 1992

## 2017-06-06 MED ORDER — CHLORPROMAZINE HCL 25 MG/ML IJ SOLN
12.5000 mg | Freq: Three times a day (TID) | INTRAMUSCULAR | Status: DC | PRN
  Administered 2017-06-06 – 2017-06-17 (×6): 12.5 mg via INTRAVENOUS
  Filled 2017-06-06 (×9): qty 0.5

## 2017-06-06 MED ORDER — ALTEPLASE 2 MG IJ SOLR
2.0000 mg | Freq: Once | INTRAMUSCULAR | Status: AC
  Administered 2017-06-06: 2 mg
  Filled 2017-06-06: qty 2

## 2017-06-06 MED ORDER — FAT EMULSION 20 % IV EMUL
240.0000 mL | INTRAVENOUS | Status: AC
  Administered 2017-06-06: 240 mL via INTRAVENOUS
  Filled 2017-06-06: qty 250

## 2017-06-06 NOTE — Progress Notes (Signed)
General Surgery [facility]  Assessment & Plan: Patient Active Problem List   Diagnosis Date Noted  . Peritonitis ([place]) 06/01/2017  . Pressure injury of skin 05/29/2017  . Mucus plugging of bronchi   . Acute respiratory failure with hypoxemia ([place])   . Ileus ([place])   . Hypokalemia 05/14/2017  . Anastomotic leak of intestine 05/14/2017  . Severe sepsis ([place]) 05/14/2017  . Wound dehiscence, surgical 05/14/2017  . Aspiration pneumonia ([place]) 05/14/2017  . Chronic narcotic use 05/10/2017  . Anxiety state 05/10/2017  . Intra-abdominal adhesions s/p SB resection 05/06/2017 05/09/2017  . H/O total adrenalectomy ([place]) 05/06/2017  . Right adrenal mass s/p adrenalectomy 05/06/2017 05/06/2017  . Throat symptom 03/18/2016  . Multinodular goiter 01/19/2016  . Hyperthyroidism 01/19/2016  . Essential hypertension   . Diarrhea 11/30/2014  . Anemia, iron deficiency 11/01/2014  . Leukocytosis 11/03/2013  . Tobacco abuse 07/26/2013  . COPD (chronic obstructive pulmonary disease) ([place]) 07/26/2013  . Left knee pain 07/26/2013  . Pain in joint, shoulder region 05/03/2013  . GERD 07/24/2008  . 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        Earnstine Regal, MD, [facility]       Office: [phone]    Chief Complaint: Agitation, pain.  Subjective: Patient in bed, appears comfortable.  Family in room.  Questions about VAC pump.  Objective: Vital signs in last 24 hours: Temp:  [98.2 F (36.8 C)-99.1 F (37.3 C)] 98.2 F (36.8 C) (08/11 0813) Pulse Rate:   [108-126] 114 (08/11 0600) Resp:  [20-27] 20 (08/11 0840) BP: (113-127)/(78-94) 127/91 (08/11 0600) SpO2:  [94 %-98 %] 96 % (08/11 0840) Last BM Date: 06/02/17  Intake/Output from previous day: 08/10 0701 - 08/11 0700 In: 2095.5 [I.V.:2055.5; NG/GT:20] Out: 1887 [Urine:725; Emesis/NG output:1050; Drains:112] Intake/Output this shift: No intake/output data recorded.  Physical Exam: HEENT - sclerae clear, mucous membranes moist Neck - soft Abdomen - soft, RUQ VAC intact with good seal; thin green succus in ileostomy tube  Lab Results:   Recent Labs  06/04/17 0317  WBC 13.2*  HGB 10.5*  HCT 30.7*  PLT 780*   BMET  Recent Labs  06/04/17 0317 06/05/17 0308  NA 129* 129*  K 4.9 4.5  CL 98* 97*  CO2 22 22  GLUCOSE 119* 135*  BUN 22* 26*  CREATININE 0.71 0.68  CALCIUM 9.3 9.3   PT/INR No results for input(s): LABPROT, INR in the last 72 hours. Comprehensive Metabolic Panel:    Component Value Date/Time   NA 129 (L) 06/05/2017 0308   NA 129 (L) 06/04/2017 0317   K 4.5 06/05/2017 0308   K 4.9 06/04/2017 0317   CL 97 (L) 06/05/2017 0308   CL 98 (L) 06/04/2017 0317   CO2 22 06/05/2017 0308   CO2 22 06/04/2017 0317   BUN 26 (H) 06/05/2017 0308   BUN 22 (H) 06/04/2017 0317   CREATININE 0.68 06/05/2017 0308   CREATININE 0.71 06/04/2017 0317   CREATININE 0.85 07/22/2013 1021   GLUCOSE 135 (H) 06/05/2017 0308   GLUCOSE 119 (H) 06/04/2017 0317   CALCIUM 9.3  06/05/2017 0308   CALCIUM 9.3 06/04/2017 0317   AST 31 06/05/2017 0308   AST 35 06/04/2017 0317   ALT 25 06/05/2017 0308   ALT 28 06/04/2017 0317   ALKPHOS 245 (H) 06/05/2017 0308   ALKPHOS 257 (H) 06/04/2017 0317   BILITOT 0.8 06/05/2017 0308   BILITOT 0.7 06/04/2017 0317   PROT 7.7 06/05/2017 0308   PROT 7.7 06/04/2017 0317   ALBUMIN 2.6 (L) 06/05/2017 0308   ALBUMIN 2.6 (L) 06/04/2017 0317    Studies/Results: No results found.    [name] [name] M 06/06/2017  Patient ID: [name] [name], male   DOB:  Jul 23, 1961, 56 y.o.   MRN: [id number]

## 2017-06-06 NOTE — Progress Notes (Addendum)
[place] NOTE   Pharmacy Consult for TPN Indication: prolonged ileus, small bowel leak, multiple bowel surgeries  Patient Measurements: Body mass index is 26.92 kg/m. Filed Weights   05/26/17 0430 06/05/17 0730 06/06/17 1000  Weight: 205 lb 11 oz (93.3 kg) 178 lb 12.7 oz (81.1 kg) 187 lb 9.8 oz (85.1 kg)   HPI: 83 yoM admitted on 7/11 for enlarging adrenal mass and planned adrenalectomy.  Pharmacy consulted to dose TPN.  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   7/22 OR:  wash out, tube ileostomy, left with open abdomen & wound vac in place.  Plan for OR on 7/25 for closure 7/25 OR: mesh placed for abdominal closure, wound vac 8/2 extubated, propofol drip off 8/4 TPN off for ~ 5 hours due to line detached from filter 8/10 MD request decrease rate x 1 week 2nd low sodium  Insulin requirements past 24 hours: 2 units SSI (no hx DM)  Current Nutrition: NPO, ice chips  IVF: D5 1/2NS at10 ml/hr  Central access: PICC line ordered 7/19, placed on 7/20 TPN start date: 7/20  ASSESSMENT                                                                                                           Today, 06/06/17  - Glucose: at goal (goal < 150) - Electrolytes:  Na low 129 x 3 days  Phos high 4.9 yesterday, ordered for today but not drawn  K 4.5, stable,   Mag 1.6> 1.9 after 2 gm bolus yesterday - Renal:  SCr wnl, stable. I/O: +208,   NGO 1050 - Weight: 85.8,  Was 93.3kg on 7/31 - GI: VAC removed, drains in - LFTs: WNL x alk phos 259.   Tbili WNL  - TGs:  296 (7/18), 266  (7/20), 269 (7/23), 210 (7/28), 285 (7/31) 301 (8/3)   - Prealbumin:  <5 (7/20), 6.1 (7/24), 10.1 (7/30) 24.8 (8/6) Prealbumin now WNL  NUTRITIONAL GOALS                                                                                             RD recs (8/10):  122-138 gms Protein (1.5 - 1.7 g/kg) 2030 - 2270 Kcal  Clinimix  E 5/15 at a max fluid volume of 3L at 120 ml/hr  + 20% fat emulsion 240 ml/day  to provide: 144 g of protein and 2568 kCals per day - Glucose infusion rate will be 3.2 mg/kg/min (Maximum 5 mg/kg/min)  Clinimix 5/20 (no electrolytes) at rate of 115 ml/hr with lipids 3x/week on MWF only would provide: 138g protein and avg 2174 kcal/day - Glucose infusion rate will be 4.11 mg/kg/min (Maximum 5 mg/kg/min)   PLAN                                                                                                                          8/10 Discussed with Dr. Kieth Brightly 8/10 - concerned about hyponatremia, possibly due to high-volume TPN. Requested to decrease rate for a short time (~1 week) and monitor sodium levels closely. Understands that this will decrease the amount of protein the patient will be receiving for the time being but prealbumin has improved and wound is healing. Also wants to add back electrolytes since phosphorus has improved today - will monitor this closely as well.  At 1800 today:  Continue Clinimix  E 5/15 at rate of 83 ml/hr (~2L volume per day) ? Lipids at 55ms/hr x 12 hours  ? This provides ~ 100 gm protein and 1894 Kcals  TPN to contain standard multivitamins and trace elements daily  [place] S[place] q6h.  TPN lab panels on Mondays & Thursdays.  BMET, Mg & Phos in AM  MEudelia Bunch Pharm.D. [phone]/08/2017 1:36 PM

## 2017-06-06 NOTE — Progress Notes (Signed)
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 Patient's course was complicated by small bowel injury requiring resection with anastomoses and placement of wound VAC. On 7/18, patient developed leak and was taken back to the operating room for redo of his exploratory laparotomy and repair of leak. That time, patient returned to the ICU on the ventilator. Critical care was consulted for ventilator/medical management.  Over the next 2 weeks, patient's hospital course was complicated with the following: 07/19 - NGT pulled out and almost self-extubated. Developed cuff leak >> tube exchanged. Back to OR emergently for intestinal leak/ischemic bowel.TPN started at that time 07/22 - Back to OR for wash out >> left with open abdomen & wound vac in place. Transfused 1u PRBC. 07/25 - rising temp, WBC increased. Returned to OR for closure 07/27 - Recultured, sedation down on precedex [facility] 7/27 >> MRSE 1/2 > contaminant ? 07/30 - Remains on sedation, VAC leak over the weekend with dressing change by [facility] 07/31 - Initial attempt at weaning failed to due to tachypnea/anxiety 08/02 - Fever 101.1, WBC 14.2, weaned / extubated 8/3 - Extubated for 1 day, tolerating well. O2 weaned to 1L. Remains on dilaudid, initially on gtt now PCA. RN reports precedex started overnight for anxiety. 8/4: Ultrasound-guided aspiration By interventional radiology of small perihepatic fluid collection yielded only 1 mL of the fluid, sent for culture. No growth yet 8/6, patient improving. Off Precedex drip  since 8/4. White blood cell count staying elevated at 14-16 for past 10 days. Hemoglobin Stable for the past 2 weeks between 7-9. Still on TPN 8/8: wound vac changed per surgery. WBC 16. 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/2017 Active Problems:   GERD   Tobacco abuse   COPD (chronic obstructive pulmonary disease) (H[facility])   Essential hypertension   Hyperthyroidism   H/O total adrenalectomy ([place])   Intra-abdominal adhesions s/p SB resection 05/06/2017   Chronic narcotic use   Anxiety state   Hypokalemia   Anastomotic leak of intestine   Severe sepsis (H[facility])   Wound dehiscence, surgical   Aspiration pneumonia (H[facility])   Acute respiratory failure with hypoxemia ([facility])   Ileus ([facility])   Mucus plugging of bronchi   Pressure injury of skin   Peritonitis ([place])   Sepsis -due to peritonitis and MSSA HCAP.  Patient has completed antibiotics for these as outlined below.  His white count has been overall stable in the last couple of weeks, and antibiotics were discontinued by general surgery on 8/8.   -WBC today pending  Acute respiratory failure with hypoxia -Secondary to sepsis and HCAP post op adrenalectomy.  Currently he is stable on nasal cannula.  He was intubated and has been extubated since 05/28/2017.  Continue DuoNeb's. -Wean off oxygen as tolerated  Peritonitis/intestinal perforation/anastomotic leak -05/29/2017 CT abdomen and pelvis thick walled fluid collection along anterior and lateral aspects of the liver; extends 20 cm along anterior and lateral margins of liver.  Interventional radiology was consulted and on 05/30/2017 he underwent aspiration.  Cultures  have remained negative. -05/20/17--ABDOMINAL WOUND [place] OUT, WOUND CLOSURE AND WOUND VAC PLACEMENT.  Wound vacs changed 8/8 -NG remains in place, patient will need to be on TPN for quite some time per general surgery -Monitor off of antibiotics, discontinued on  8/8 -Remains afebrile  Hyponatremia -Suspect due to TPN, also need to be very careful regarding volume status as he can be prone for fluid overload given poor mobility/TPN, will reinstate daily weights. -today's weight 178 from 205 11 days ago ?? real -osmolalities OK, no fluid overload on clinical exam.  -continue to monitor, Na pending today -TSH normal  MSSA HCAP -finished 7 days of cefepime, afebrile, white count is improving, stable respiratory status  Acute blood loss anemia -Baseline Hgb ~12 prior to admission, currently ~ 10. Stable -Continue PPI -There were some concerns for coffee-ground return in his NG tube therefore he is on SCDs and his Lovenox is on hold -most recent Hb on 10.5 on 8/9.   R-adrenal mass/pheochromocytoma -s/p R-adrenalectomy, surgery was complicated by small bowel injury in the setting of adhesions with sepsis secondary to intra-abdominal infection -post-op care per surgery  Acute metabolic encephalopathy -multifactorial including sepsis, infection, electrolyte derangement, opioids/benzos -overall improving  Thrombocytosis -likely acute phase reactant and possible iron deficiency -pt had normal platelet count at time of admission  Essential Hypertension -remains stable off home clonidine, labetalol, chlorthalidone, valsartan  Chronic pain syndrome/Post-op pain -had significant post-op pain -remains on dilaudid PCA, po oxycodone scheduled, ativan scheduled -pain management per CCS -previously on home gaba, percocet  Hyperlipidemia -remain off lipitor until able to tolerate po reliably  Tobacco abuse -nicoderm patch  Hyperglycemia -likely stress induced and TPN -A1C was 5.6, not a diabetic -continue novolog sliding scale   DVT prophylaxis: SCDs Code Status: Full code Family Communication: no family at bedside Disposition Plan: dispo per general surgery  Procedures:   As per progress note  Antimicrobials: Vancomycin 7/17  >>>7/21; 7/30 >>8/1 Eraxis 7/19 >>> 7/21, 7/26 >> 8/1 Cefazolin 7/31>>>8/3 Cefepime 7/17 >>>7/31 ; 8/3>>>8/8 Flagyl 7/20 >>> 7/30; 8/3>>> 8/8  Subjective: -Feels very anxious, abdominal pain is controlled.  No shortness of breath, no chest pain.  Objective: Vitals:   06/06/17 0813 06/06/17 0840 06/06/17 1000 06/06/17 1012  BP:      Pulse:      Resp:  20    Temp: 98.2 F (36.8 C)     TempSrc: Oral     SpO2:  96%  97%  Weight:   85.1 kg (187 lb 9.8 oz)   Height:        Intake/Output Summary (Last 24 hours) at 06/06/17 1116 Last data filed at 06/05/17 2200  Gross per 24 hour  Intake          1739.54 ml  Output             1887 ml  Net          -147.46 ml   Filed Weights   05/26/17 0430 06/05/17 0730 06/06/17 1000  Weight: 93.3 kg (205 lb 11 oz) 81.1 kg (178 lb 12.7 oz) 85.1 kg (187 lb 9.8 oz)    Examination:  Vitals:   06/06/17 0813 06/06/17 0840 06/06/17 1000 06/06/17 1012  BP:      Pulse:      Resp:  20    Temp: 98.2 F (36.8 C)     TempSrc: Oral     SpO2:  96%  97%  Weight:   85.1 kg (187 lb 9.8 oz)  Height:       Constitutional: NAD Respiratory: CTA biL Cardiovascular: RRR, no edema Abdomen: drains / wound vac in place  Data Reviewed: I have independently reviewed following labs and imaging studies   CBC:  Recent Labs Lab 05/31/17 1235 06/01/17 0543 06/02/17 0413 06/03/17 0723 06/04/17 0317  WBC 14.5* 12.6* 15.3* 16.1* 13.2*  NEUTROABS 9.7* 8.5* 9.9* 10.8* 8.4*  HGB 8.1* 10.4* 8.9* 9.7* 10.5*  HCT 24.1* 31.3* 26.1* 29.0* 30.7*  MCV 78.2 79.8 79.3 79.0 79.5  PLT 890* 742* 898* 889* 295*   Basic Metabolic Panel:  Recent Labs Lab 05/31/17 0315 06/01/17 0543 06/02/17 0413 06/04/17 0317 06/05/17 0308 06/05/17 0334  NA 133* 134* 132* 129* 129*  --   K 4.1 4.3 4.7 4.9 4.5  --   CL 98* 99* 100* 98* 97*  --   CO2 26 25 22 22 22   --   GLUCOSE 119* 109* 109* 119* 135*  --   BUN 20 20 22* 22* 26*  --   CREATININE 0.67 0.67 0.66 0.71 0.68   --   CALCIUM 8.4* 8.6* 8.9 9.3 9.3  --   MG 1.9 2.1  --  1.8  --  1.6*  PHOS 4.6 4.5  --  5.6*  --  4.9*   GFR: Estimated Creatinine Clearance: 106.5 mL/min (by C-G formula based on SCr of 0.68 mg/dL). Liver Function Tests:  Recent Labs Lab 06/01/17 0543 06/04/17 0317 06/05/17 0308  AST 41 35 31  ALT 37 28 25  ALKPHOS 269* 257* 245*  BILITOT 0.6 0.7 0.8  PROT 7.3 7.7 7.7  ALBUMIN 2.2* 2.6* 2.6*   No results for input(s): LIPASE, AMYLASE in the last 168 hours. No results for input(s): AMMONIA in the last 168 hours. Coagulation Profile: No results for input(s): INR, PROTIME in the last 168 hours. Cardiac Enzymes: No results for input(s): CKTOTAL, CKMB, CKMBINDEX, TROPONINI in the last 168 hours. BNP (last 3 results) No results for input(s): PROBNP in the last 8760 hours. HbA1C: No results for input(s): HGBA1C in the last 72 hours. CBG:  Recent Labs Lab 06/05/17 0546 06/05/17 1146 06/05/17 1702 06/05/17 2327 06/06/17 0502  GLUCAP 148* 145* 140* 122* 130*   Lipid Profile: No results for input(s): CHOL, HDL, LDLCALC, TRIG, CHOLHDL, LDLDIRECT in the last 72 hours. Thyroid Function Tests: No results for input(s): TSH, T4TOTAL, FREET4, T3FREE, THYROIDAB in the last 72 hours. Anemia Panel: No results for input(s): VITAMINB12, FOLATE, FERRITIN, TIBC, IRON, RETICCTPCT in the last 72 hours. Urine analysis:    Component Value Date/Time   COLORURINE AMBER (A) 05/22/2017 1300   APPEARANCEUR CLEAR 05/22/2017 1300   LABSPEC 1.021 05/22/2017 1300   PHURINE 6.0 05/22/2017 1300   GLUCOSEU NEGATIVE 05/22/2017 1300   HGBUR SMALL (A) 05/22/2017 1300   BILIRUBINUR NEGATIVE 05/22/2017 1300   KETONESUR NEGATIVE 05/22/2017 1300   PROTEINUR NEGATIVE 05/22/2017 1300   UROBILINOGEN 0.2 02/01/2014 0454   NITRITE NEGATIVE 05/22/2017 1300   LEUKOCYTESUR SMALL (A) 05/22/2017 1300   Sepsis Labs: Invalid input(s): PROCALCITONIN, LACTICIDVEN  Recent Results (from the past 240 hour(s))    Body fluid culture     Status: None   Collection Time: 05/30/17 12:16 PM  Result Value Ref Range Status   Specimen Description FLUID ASPIRATE  Final   Special Requests SMALL PERIHEPATIC FLUID  Final   Gram Stain   Final    FEW WBC PRESENT, PREDOMINANTLY PMN NO ORGANISMS SEEN    Culture   Final  NO GROWTH 3 DAYS Performed at [facility], [place] [address] [place], [place] [address]    Report Status 06/04/2017 FINAL  Final      Radiology Studies: No results found.   Scheduled Meds: . chlorhexidine  15 mL Mouth Rinse BID  . Chlorhexidine Gluconate Cloth  6 each Topical Q1200  . HYDROmorphone   Intravenous Q4H  . insulin aspart  0-15 Units Subcutaneous Q6H  . ipratropium-albuterol  3 mL Nebulization BID  . lip balm  1 application Topical BID  . LORazepam  2 mg Oral Q6H  . mouth rinse  15 mL Mouth Rinse q12n4p  . nicotine  21 mg Transdermal Daily  . oxyCODONE  10 mg Per Tube Q4H  . pantoprazole (PROTONIX) IV  40 mg Intravenous Q12H  . thiamine injection  100 mg Intravenous Daily   Continuous Infusions: . dextrose 5 % and 0.45% NaCl 10 mL/hr at 06/05/17 0730  . Mar[place] KitchenTPN (CLINIMIX-E) Adult 83 mL/hr at 06/05/17 1740    Marzetta Board, MD, PhD [facility] Pager [phone]  If 7PM-7AM, please contact night-coverage [email or web address] Password [facility] 06/06/2017, 11:16 AM

## 2017-06-07 DIAGNOSIS — R000 Tachycardia, unspecified: Secondary | ICD-10-CM

## 2017-06-07 LAB — GLUCOSE, CAPILLARY
GLUCOSE-CAPILLARY: 131 mg/dL — AB (ref 65–99)
GLUCOSE-CAPILLARY: 134 mg/dL — AB (ref 65–99)
GLUCOSE-CAPILLARY: 137 mg/dL — AB (ref 65–99)
GLUCOSE-CAPILLARY: 140 mg/dL — AB (ref 65–99)
GLUCOSE-CAPILLARY: 143 mg/dL — AB (ref 65–99)

## 2017-06-07 LAB — BASIC METABOLIC PANEL
ANION GAP: 13 (ref 5–15)
BUN: 35 mg/dL — ABNORMAL HIGH (ref 6–20)
CHLORIDE: 93 mmol/L — AB (ref 101–111)
CO2: 23 mmol/L (ref 22–32)
Calcium: 9.7 mg/dL (ref 8.9–10.3)
Creatinine, Ser: 0.86 mg/dL (ref 0.61–1.24)
GFR calc Af Amer: >60 mL/min (ref >60)
GLUCOSE: 127 mg/dL — AB (ref 65–99)
POTASSIUM: 4.4 mmol/L (ref 3.5–5.1)
Sodium: 129 mmol/L — ABNORMAL LOW (ref 135–145)

## 2017-06-07 LAB — PHOSPHORUS: Phosphorus: 6.5 mg/dL — ABNORMAL HIGH (ref 2.5–4.6)

## 2017-06-07 LAB — MAGNESIUM: Magnesium: 1.9 mg/dL (ref 1.7–2.4)

## 2017-06-07 MED ORDER — TRACE MINERALS CR-CU-MN-SE-ZN 10-1000-500-60 MCG/ML IV SOLN
INTRAVENOUS | Status: AC
  Administered 2017-06-07: 18:00:00 via INTRAVENOUS
  Filled 2017-06-07 (×2): qty 1992

## 2017-06-07 MED ORDER — FAT EMULSION 20 % IV EMUL
240.0000 mL | INTRAVENOUS | Status: AC
  Administered 2017-06-07: 240 mL via INTRAVENOUS
  Filled 2017-06-07: qty 250

## 2017-06-07 NOTE — Progress Notes (Signed)
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 Patient's course was complicated by small bowel injury requiring resection with anastomoses and placement of wound VAC. On 7/18, patient developed leak and was taken back to the operating room for redo of his exploratory laparotomy and repair of leak. That time, patient returned to the ICU on the ventilator. Critical care was consulted for ventilator/medical management.  Over the next 2 weeks, patient's hospital course was complicated with the following: 07/19 - NGT pulled out and almost self-extubated. Developed cuff leak >> tube exchanged. Back to OR emergently for intestinal leak/ischemic bowel.TPN started at that time 07/22 - Back to OR for wash out >> left with open abdomen & wound vac in place. Transfused 1u PRBC. 07/25 - rising temp, WBC increased. Returned to OR for closure 07/27 - Recultured, sedation down on precedex [facility] 7/27 >> MRSE 1/2 > contaminant ? 07/30 - Remains on sedation, VAC leak over the weekend with dressing change by [facility] 07/31 - Initial attempt at weaning failed to due to tachypnea/anxiety 08/02 - Fever 101.1, WBC 14.2, weaned / extubated 8/3 - Extubated for 1 day, tolerating well. O2 weaned to 1L. Remains on dilaudid, initially on gtt now PCA. RN reports precedex started overnight for anxiety. 8/4: Ultrasound-guided aspiration By interventional radiology of small perihepatic fluid collection yielded only 1 mL of the fluid, sent for culture. No growth yet 8/6, patient improving. Off Precedex drip  since 8/4. White blood cell count staying elevated at 14-16 for past 10 days. Hemoglobin Stable for the past 2 weeks between 7-9. Still on TPN 8/8: wound vac changed per surgery. WBC 16. 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  Patient has completed antibiotics for these as outlined below.  His white count has been overall stable in the last couple of weeks, and antibiotics were discontinued by general surgery on 8/8.    Acute respiratory failure with hypoxia -Secondary to sepsis and HCAP post op adrenalectomy.  Currently he is stable on nasal cannula.  He was intubated and has been extubated since 05/28/2017.  Continue DuoNeb's. -Wean off oxygen as tolerated  Sinus tachycardia -slight worsening today, rates in the 130s while sleeping -will get an echo for completeness  Peritonitis/intestinal perforation/anastomotic leak -05/29/2017 CT abdomen and pelvis thick walled fluid collection along anterior and lateral aspects of the liver; extends 20 cm along anterior and lateral margins  of liver.  Interventional radiology was consulted and on 05/30/2017 he underwent aspiration.  Cultures have remained negative. -05/20/17--ABDOMINAL WOUND [place] OUT, WOUND CLOSURE AND WOUND VAC PLACEMENT.  Wound vacs changed 8/8 -NG remains in place, patient will need to be on TPN  for quite some time per general surgery -Monitor off of antibiotics, discontinued on 8/8 -Remains afebrile  Hyponatremia -Suspect due to TPN, also need to be very careful regarding volume status as he can be prone for fluid overload given poor mobility/TPN, will reinstate daily weights. -today's weight 178 from 205 11 days ago ?? real -osmolalities OK, no fluid overload on clinical exam.  -continue to monitor, Na stable -TSH normal  MSSA HCAP -finished 7 days of cefepime, afebrile, white count is improving, stable respiratory status  Acute blood loss anemia -Baseline Hgb ~12 prior to admission, currently ~ 10. Stable -Continue PPI -There were some concerns for coffee-ground return in his NG tube therefore he is on SCDs and his Lovenox is on hold  R-adrenal mass/pheochromocytoma -s/p R-adrenalectomy, surgery was complicated by small bowel injury in the setting of adhesions with sepsis secondary to intra-abdominal infection -post-op care per surgery  Acute metabolic encephalopathy -multifactorial including sepsis, infection, electrolyte derangement, opioids/benzos -overall improving  Thrombocytosis -likely acute phase reactant and possible iron deficiency -pt had normal platelet count at time of admission  Essential Hypertension -remains stable off home clonidine, labetalol, chlorthalidone, valsartan  Chronic pain syndrome/Post-op pain -had significant post-op pain -remains on dilaudid PCA, po oxycodone scheduled, ativan scheduled -pain management per CCS -previously on home gaba, percocet  Hyperlipidemia -remain off lipitor until able to tolerate po reliably  Tobacco abuse -nicoderm patch  Hyperglycemia -likely stress induced and TPN -A1C was 5.6, not a diabetic -continue novolog sliding scale    DVT prophylaxis: SCDs Code Status: Full code Family Communication: no family at bedside Disposition Plan: dispo per general surgery  Procedures:   As per  progress note  Antimicrobials: Vancomycin 7/17 >>>7/21; 7/30 >>8/1 Eraxis 7/19 >>> 7/21, 7/26 >> 8/1 Cefazolin 7/31>>>8/3 Cefepime 7/17 >>>7/31 ; 8/3>>>8/8 Flagyl 7/20 >>> 7/30; 8/3>>> 8/8  Subjective: -drowsy, easily drifts back asleep.   Objective: Vitals:   06/07/17 1131 06/07/17 1200 06/07/17 1215 06/07/17 1300  BP:  114/72    Pulse:    (!) 122  Resp: (!) 23 (!) 27  (!) 22  Temp:   98.4 F (36.9 C)   TempSrc:   Oral   SpO2: 97%   95%  Weight:      Height:        Intake/Output Summary (Last 24 hours) at 06/07/17 1335 Last data filed at 06/07/17 1300  Gross per 24 hour  Intake          2634.33 ml  Output             2670 ml  Net           -35.67 ml   Filed Weights   06/05/17 0730 06/06/17 1000 06/07/17 0603  Weight: 81.1 kg (178 lb 12.7 oz) 85.1 kg (187 lb 9.8 oz) 78.1 kg (172 lb 2.9 oz)    Examination:  Vitals:   06/07/17 1131 06/07/17 1200 06/07/17 1215 06/07/17 1300  BP:  114/72    Pulse:    (!) 122  Resp: (!) 23 (!) 27  (!) 22  Temp:   98.4 F (36.9 C)   TempSrc:   Oral   SpO2: 97%   95%  Weight:      Height:  Constitutional: NAD HEENT: no scleral icterus, lids and conjunctivae normal  Respiratory: CTA biL Cardiovascular: RRR, no edema, tachycardic Abdomen: drains / wound vac in place Neuro: non focal   Data Reviewed: I have independently reviewed following labs and imaging studies   CBC:  Recent Labs Lab 06/01/17 0543 06/02/17 0413 06/03/17 0723 06/04/17 0317  WBC 12.6* 15.3* 16.1* 13.2*  NEUTROABS 8.5* 9.9* 10.8* 8.4*  HGB 10.4* 8.9* 9.7* 10.5*  HCT 31.3* 26.1* 29.0* 30.7*  MCV 79.8 79.3 79.0 79.5  PLT 742* 898* 889* 353*   Basic Metabolic Panel:  Recent Labs Lab 06/01/17 0543 06/02/17 0413 06/04/17 0317 06/05/17 0308 06/05/17 0334 06/06/17 1159 06/07/17 0658  NA 134* 132* 129* 129*  --  129* 129*  K 4.3 4.7 4.9 4.5  --  4.5 4.4  CL 99* 100* 98* 97*  --  96* 93*  CO2 25 22 22 22   --  24 23  GLUCOSE 109* 109*  119* 135*  --  132* 127*  BUN 20 22* 22* 26*  --  31* 35*  CREATININE 0.67 0.66 0.71 0.68  --  0.82 0.86  CALCIUM 8.6* 8.9 9.3 9.3  --  9.7 9.7  MG 2.1  --  1.8  --  1.6* 1.9 1.9  PHOS 4.5  --  5.6*  --  4.9*  --  6.5*   GFR: Estimated Creatinine Clearance: 99 mL/min (by C-G formula based on SCr of 0.86 mg/dL). Liver Function Tests:  Recent Labs Lab 06/01/17 0543 06/04/17 0317 06/05/17 0308 06/06/17 1159  AST 41 35 31 35  ALT 37 28 25 31   ALKPHOS 269* 257* 245* 259*  BILITOT 0.6 0.7 0.8 1.0  PROT 7.3 7.7 7.7 8.4*  ALBUMIN 2.2* 2.6* 2.6* 2.9*   No results for input(s): LIPASE, AMYLASE in the last 168 hours. No results for input(s): AMMONIA in the last 168 hours. Coagulation Profile: No results for input(s): INR, PROTIME in the last 168 hours. Cardiac Enzymes: No results for input(s): CKTOTAL, CKMB, CKMBINDEX, TROPONINI in the last 168 hours. BNP (last 3 results) No results for input(s): PROBNP in the last 8760 hours. HbA1C: No results for input(s): HGBA1C in the last 72 hours. CBG:  Recent Labs Lab 06/06/17 1136 06/06/17 1758 06/07/17 0020 06/07/17 0601 06/07/17 1221  GLUCAP 138* 131* 140* 137* 143*   Lipid Profile: No results for input(s): CHOL, HDL, LDLCALC, TRIG, CHOLHDL, LDLDIRECT in the last 72 hours. Thyroid Function Tests: No results for input(s): TSH, T4TOTAL, FREET4, T3FREE, THYROIDAB in the last 72 hours. Anemia Panel: No results for input(s): VITAMINB12, FOLATE, FERRITIN, TIBC, IRON, RETICCTPCT in the last 72 hours. Urine analysis:    Component Value Date/Time   COLORURINE AMBER (A) 05/22/2017 1300   APPEARANCEUR CLEAR 05/22/2017 1300   LABSPEC 1.021 05/22/2017 1300   PHURINE 6.0 05/22/2017 1300   GLUCOSEU NEGATIVE 05/22/2017 1300   HGBUR SMALL (A) 05/22/2017 1300   BILIRUBINUR NEGATIVE 05/22/2017 1300   KETONESUR NEGATIVE 05/22/2017 1300   PROTEINUR NEGATIVE 05/22/2017 1300   UROBILINOGEN 0.2 02/01/2014 0454   NITRITE NEGATIVE 05/22/2017 1300    LEUKOCYTESUR SMALL (A) 05/22/2017 1300   Sepsis Labs: Invalid input(s): PROCALCITONIN, LACTICIDVEN  Recent Results (from the past 240 hour(s))  Body fluid culture     Status: None   Collection Time: 05/30/17 12:16 PM  Result Value Ref Range Status   Specimen Description FLUID ASPIRATE  Final   Special Requests SMALL PERIHEPATIC FLUID  Final   Gram Stain   Final  FEW WBC PRESENT, PREDOMINANTLY PMN NO ORGANISMS SEEN    Culture   Final    NO GROWTH 3 DAYS Performed at [facility] [address] [place], [place] [address]    Report Status 06/04/2017 FINAL  Final      Radiology Studies: No results found.   Scheduled Meds: . chlorhexidine  15 mL Mouth Rinse BID  . Chlorhexidine Gluconate Cloth  6 each Topical Q1200  . HYDROmorphone   Intravenous Q4H  . insulin aspart  0-15 Units Subcutaneous Q6H  . ipratropium-albuterol  3 mL Nebulization BID  . lip balm  1 application Topical BID  . LORazepam  2 mg Oral Q6H  . mouth rinse  15 mL Mouth Rinse q12n4p  . nicotine  21 mg Transdermal Daily  . oxyCODONE  10 mg Per Tube Q4H  . pantoprazole (PROTONIX) IV  40 mg Intravenous Q12H  . thiamine injection  100 mg Intravenous Daily   Continuous Infusions: . chlorproMAZINE (THORAZINE) IV 12.5 mg (06/07/17 0952)  . dextrose 5 % and 0.45% NaCl 10 mL/hr at 06/07/17 0800  . TPN (CLINIMIX) Adult without lytes     And  . fat emulsion    . Mar[place] KitchenTPN (CLINIMIX-E) Adult 83 mL/hr at 06/07/17 0800    Marzetta Board, MD, PhD [facility] Pager [phone]  If 7PM-7AM, please contact night-coverage [email or web address] Password [email or web address] 06/07/2017, 1:35 PM

## 2017-06-07 NOTE — Progress Notes (Signed)
[place] NOTE   Pharmacy Consult for TPN Indication: prolonged ileus, small bowel leak, multiple bowel surgeries  Patient Measurements: Body mass index is 24.71 kg/m. Filed Weights   06/05/17 0730 06/06/17 1000 06/07/17 0603  Weight: 178 lb 12.7 oz (81.1 kg) 187 lb 9.8 oz (85.1 kg) 172 lb 2.9 oz (78.1 kg)   HPI: 40 yoM admitted on 7/11 for enlarging adrenal mass and planned adrenalectomy.  Pharmacy consulted to dose TPN.  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   7/22 OR:  wash out, tube ileostomy, left with open abdomen & wound vac in place.  Plan for OR on 7/25 for closure 7/25 OR: mesh placed for abdominal closure, wound vac 8/2 extubated, propofol drip off 8/4 TPN off for ~ 5 hours due to line detached from filter 8/10 MD request decrease rate x 1 week 2nd low sodium  Insulin requirements past 24 hours: 8 units SSI (no hx DM)  Current Nutrition: NPO, ice chips  IVF: D5 1/2NS at10 ml/hr  Central access: PICC line ordered 7/19, placed on 7/20 TPN start date: 7/20  ASSESSMENT                                                                                                           Today, 06/07/17  - Glucose: at goal (goal < 150) - Electrolytes:  Na low 129 x 4 days - will add 154 meq Na/L to TPN - equivalent to NS, standard TPN has ~ 70 meq/L  Phos high 6.5, CoCa 10.58, CaxPhos = ~ 69  goal < 55   K 4.4, stable,   Mag 1.9  - Renal:  SCr wnl, stable. I/O: +563,   NGO 1300 - Weight:78.1,  Was 93.3kg on 7/31 - GI: VAC removed, drains in - LFTs: WNL x alk phos  259.   Tbili WNL  - TGs:  296 (7/18), 266 (7/20), 269 (7/23), 210 (7/28), 285 (7/31) 301 (8/3)   - Prealbumin:  <5 (7/20), 6.1 (7/24), 10.1 (7/30) 24.8 (8/6) Prealbumin now WNL  NUTRITIONAL GOALS                                                                                             RD recs (8/10):  122-138 gms Protein (1.5 - 1.7 g/kg) 2030 - 2270 Kcal  Clinimix  E 5/15 at  a max fluid volume of 3L at 120 ml/hr  + 20% fat emulsion 240 ml/day to provide: 144 g of protein and 2568 kCals per day - Glucose infusion rate will be 3.2 mg/kg/min (Maximum 5 mg/kg/min)  Clinimix 5/20 (no electrolytes) at rate of 115 ml/hr with lipids 3x/week on MWF only would provide: 138g protein and avg 2174 kcal/day - Glucose infusion rate will be 4.11 mg/kg/min (Maximum 5 mg/kg/min)   PLAN                                                                                                                          8/10 Discussed with Dr. Kieth Brightly 8/10 - concerned about hyponatremia, possibly due to high-volume TPN. Requested to decrease rate for a short time (~1 week) and monitor sodium levels closely. Understands that this will decrease the amount of protein the patient will be receiving for the time being but prealbumin has improved and wound is healing. Also wants to add back electrolytes since phosphorus has improved today - will monitor this closely as well.  At 1800 today:  Take out electrolytes due to elevated calcium-phosphorus product and add sodium 154 meq/L and continue rate of 83 ml/hr (~2L volume per day) ? Lipids at 30ms/hr x 12 hours  ? This provides ~ 100 gm protein and 1894 Kcals  TPN to contain standard multivitamins and trace elements daily  [facility].  TPN lab panels on Mondays & Thursdays.  MEudelia Bunch Pharm.D. [phone]/09/2017 8:54 AM

## 2017-06-07 NOTE — Progress Notes (Signed)
  General Surgery [facility]  Assessment & Plan: POD#32 - status post right adrenalectomy, subsequent laparotomy with drainage, small bowel resection  VAC dressing intact - dressing change tomorrow  Drains in place  Pain, agitation controlled better        Earnstine Regal, MD, [facility]       Office: [phone]    Chief Complaint: Right adrenalectomy, small bowel injury  Subjective: Patient awake, responsive this AM.  No complaints.  Family sleeping at bedside.  Objective: Vital signs in last 24 hours: Temp:  [97.5 F (36.4 C)-98.9 F (37.2 C)] 97.5 F (36.4 C) (08/12 0747) Pulse Rate:  [106-136] 111 (08/12 0611) Resp:  [17-29] 22 (08/12 0611) BP: (96-131)/(67-88) 106/87 (08/12 0600) SpO2:  [91 %-97 %] 94 % (08/12 0611) Weight:  [78.1 kg (172 lb 2.9 oz)-85.1 kg (187 lb 9.8 oz)] 78.1 kg (172 lb 2.9 oz) (08/12 0603) Last BM Date: 06/02/17  Intake/Output from previous day: 08/11 0701 - 08/12 0700 In: 3233.3 [I.V.:3058.3; NG/GT:100; IV Piggyback:25] Out: 2670 [Urine:1000; Emesis/NG output:1300; Drains:370] Intake/Output this shift: No intake/output data recorded.  Physical Exam: HEENT - sclerae clear, mucous membranes moist Neck - soft Chest - clear bilaterally Cor - tachy Abdomen - soft without distension; RUQ VAC intact; drains with thin green succus  Lab Results:  No results for input(s): WBC, HGB, HCT, PLT in the last 72 hours. BMET  Recent Labs  06/05/17 0308 06/06/17 1159  NA 129* 129*  K 4.5 4.5  CL 97* 96*  CO2 22 24  GLUCOSE 135* 132*  BUN 26* 31*  CREATININE 0.68 0.82  CALCIUM 9.3 9.7   PT/INR No results for input(s): LABPROT, INR in the last 72 hours. Comprehensive Metabolic Panel:    Component Value Date/Time   NA 129 (L) 06/06/2017 1159   NA 129 (L) 06/05/2017 0308   K 4.5 06/06/2017 1159   K 4.5 06/05/2017 0308   CL 96 (L) 06/06/2017 1159   CL 97 (L) 06/05/2017 0308   CO2 24  06/06/2017 1159   CO2 22 06/05/2017 0308   BUN 31 (H) 06/06/2017 1159   BUN 26 (H) 06/05/2017 0308   CREATININE 0.82 06/06/2017 1159   CREATININE 0.68 06/05/2017 0308   CREATININE 0.85 07/22/2013 1021   GLUCOSE 132 (H) 06/06/2017 1159   GLUCOSE 135 (H) 06/05/2017 0308   CALCIUM 9.7 06/06/2017 1159   CALCIUM 9.3 06/05/2017 0308   AST 35 06/06/2017 1159   AST 31 06/05/2017 0308   ALT 31 06/06/2017 1159   ALT 25 06/05/2017 0308   ALKPHOS 259 (H) 06/06/2017 1159   ALKPHOS 245 (H) 06/05/2017 0308   BILITOT 1.0 06/06/2017 1159   BILITOT 0.8 06/05/2017 0308   PROT 8.4 (H) 06/06/2017 1159   PROT 7.7 06/05/2017 0308   ALBUMIN 2.9 (L) 06/06/2017 1159   ALBUMIN 2.6 (L) 06/05/2017 0308    Studies/Results: No results found.    Rendon Howell M 06/07/2017  Patient ID: [name] [name], male   DOB: [birth year], 56 y.o.   MRN: [id number]

## 2017-06-08 ENCOUNTER — Inpatient Hospital Stay (HOSPITAL_COMMUNITY): Payer: Medicare Other

## 2017-06-08 DIAGNOSIS — R000 Tachycardia, unspecified: Secondary | ICD-10-CM

## 2017-06-08 LAB — CBC
HCT: 28 % — ABNORMAL LOW (ref 39.0–52.0)
Hemoglobin: 9.3 g/dL — ABNORMAL LOW (ref 13.0–17.0)
MCH: 26.4 pg (ref 26.0–34.0)
MCHC: 33.2 g/dL (ref 30.0–36.0)
MCV: 79.5 fL (ref 78.0–100.0)
Platelets: 635 10*3/uL — ABNORMAL HIGH (ref 150–400)
RBC: 3.52 MIL/uL — AB (ref 4.22–5.81)
RDW: 16.8 % — ABNORMAL HIGH (ref 11.5–15.5)
WBC: 15.4 10*3/uL — AB (ref 4.0–10.5)

## 2017-06-08 LAB — GLUCOSE, CAPILLARY
GLUCOSE-CAPILLARY: 128 mg/dL — AB (ref 65–99)
Glucose-Capillary: 129 mg/dL — ABNORMAL HIGH (ref 65–99)
Glucose-Capillary: 135 mg/dL — ABNORMAL HIGH (ref 65–99)

## 2017-06-08 LAB — DIFFERENTIAL
Basophils Absolute: 0 10*3/uL (ref 0.0–0.1)
Basophils Relative: 0 %
EOS PCT: 2 %
Eosinophils Absolute: 0.3 10*3/uL (ref 0.0–0.7)
LYMPHS PCT: 28 %
Lymphs Abs: 4.4 10*3/uL — ABNORMAL HIGH (ref 0.7–4.0)
MONO ABS: 1.9 10*3/uL — AB (ref 0.1–1.0)
MONOS PCT: 12 %
NEUTROS ABS: 8.9 10*3/uL — AB (ref 1.7–7.7)
Neutrophils Relative %: 58 %

## 2017-06-08 LAB — COMPREHENSIVE METABOLIC PANEL
ALK PHOS: 212 U/L — AB (ref 38–126)
ALT: 27 U/L (ref 17–63)
ANION GAP: 10 (ref 5–15)
AST: 32 U/L (ref 15–41)
Albumin: 2.8 g/dL — ABNORMAL LOW (ref 3.5–5.0)
BUN: 36 mg/dL — ABNORMAL HIGH (ref 6–20)
CALCIUM: 9.5 mg/dL (ref 8.9–10.3)
CO2: 27 mmol/L (ref 22–32)
CREATININE: 0.95 mg/dL (ref 0.61–1.24)
Chloride: 95 mmol/L — ABNORMAL LOW (ref 101–111)
Glucose, Bld: 122 mg/dL — ABNORMAL HIGH (ref 65–99)
Potassium: 4.4 mmol/L (ref 3.5–5.1)
SODIUM: 132 mmol/L — AB (ref 135–145)
TOTAL PROTEIN: 7.8 g/dL (ref 6.5–8.1)
Total Bilirubin: 0.8 mg/dL (ref 0.3–1.2)

## 2017-06-08 LAB — MAGNESIUM: MAGNESIUM: 1.7 mg/dL (ref 1.7–2.4)

## 2017-06-08 LAB — ECHOCARDIOGRAM COMPLETE
HEIGHTINCHES: 70 in
WEIGHTICAEL: 2793.67 [oz_av]

## 2017-06-08 LAB — PREALBUMIN: PREALBUMIN: 26.4 mg/dL (ref 18–38)

## 2017-06-08 LAB — PHOSPHORUS: PHOSPHORUS: 5.8 mg/dL — AB (ref 2.5–4.6)

## 2017-06-08 MED ORDER — TRACE MINERALS CR-CU-MN-SE-ZN 10-1000-500-60 MCG/ML IV SOLN
INTRAVENOUS | Status: AC
  Administered 2017-06-08: 19:00:00 via INTRAVENOUS
  Filled 2017-06-08: qty 1992

## 2017-06-08 MED ORDER — ACETAMINOPHEN 10 MG/ML IV SOLN
1000.0000 mg | Freq: Four times a day (QID) | INTRAVENOUS | Status: AC
  Administered 2017-06-08 – 2017-06-09 (×4): 1000 mg via INTRAVENOUS
  Filled 2017-06-08 (×7): qty 100

## 2017-06-08 MED ORDER — PIPERACILLIN-TAZOBACTAM 3.375 G IVPB
3.3750 g | Freq: Three times a day (TID) | INTRAVENOUS | Status: DC
  Administered 2017-06-08 – 2017-06-16 (×24): 3.375 g via INTRAVENOUS
  Filled 2017-06-08 (×24): qty 50

## 2017-06-08 MED ORDER — FAT EMULSION 20 % IV EMUL
240.0000 mL | INTRAVENOUS | Status: AC
  Administered 2017-06-08: 240 mL via INTRAVENOUS
  Filled 2017-06-08: qty 250

## 2017-06-08 NOTE — Progress Notes (Signed)
Progress Note: General Surgery Service   Assessment/Plan: Patient Active Problem List   Diagnosis Date Noted  . Peritonitis ([place]) 06/01/2017  . Pressure injury of skin 05/29/2017  . Mucus plugging of bronchi   . Acute respiratory failure with hypoxemia ([place])   . Ileus ([place])   . Hypokalemia 05/14/2017  . Anastomotic leak of intestine 05/14/2017  . Severe sepsis ([place]) 05/14/2017  . Wound dehiscence, surgical 05/14/2017  . Aspiration pneumonia ([place]) 05/14/2017  . Chronic narcotic use 05/10/2017  . Anxiety state 05/10/2017  . Intra-abdominal adhesions s/p SB resection 05/06/2017 05/09/2017  . H/O total adrenalectomy ([place]) 05/06/2017  . Right adrenal mass s/p adrenalectomy 05/06/2017 05/06/2017  . Throat symptom 03/18/2016  . Multinodular goiter 01/19/2016  . Hyperthyroidism 01/19/2016  . Essential hypertension   . Diarrhea 11/30/2014  . Anemia, iron deficiency 11/01/2014  . Leukocytosis 11/03/2013  . Tobacco abuse 07/26/2013  . COPD (chronic obstructive pulmonary disease) ([place]) 07/26/2013  . Left knee pain 07/26/2013  . Pain in joint, shoulder region 05/03/2013  . GERD 07/24/2008  . 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 The rest of the wound had good granulation tissue. On probing no fistulous tract was identified. -empiric abx for concern of stool leakage -white sponge to affected area and black sponge over the rest -reassess wound tomorrow  Anxiety -continue intermittent ativan  Pain control -continue dilaudid PCA -continue oxycodone per tube  Hyponatremia -improved  Leak after intestinal resection -continue TPN -continue ileostomy tubes to drainage -continue NG tube    LOS: 33 days  Chief Complaint/Subjective: Smell coming from area, anxiety over the weekend, up  to side of bed yesterday  Objective: Vital signs in last 24 hours: Temp:  [97.8 F (36.6 C)-99 F (37.2 C)] 98.1 F (36.7 C) (08/13 0800) Pulse Rate:  [112-129] 120 (08/13 0900) Resp:  [18-27] 27 (08/13 0905) BP: (87-114)/(67-83) 109/69 (08/13 0859) SpO2:  [91 %-97 %] 96 % (08/13 0905) FiO2 (%):  [98 %] 98 % (08/13 0400) Weight:  [79.2 kg (174 lb 9.7 oz)] 79.2 kg (174 lb 9.7 oz) (08/13 0500) Last BM Date: 06/07/17 (has an ileostomy)  Intake/Output from previous day: 08/12 0701 - 08/13 0700 In: 2375.1 [I.V.:2255.1; NG/GT:10; IV Piggyback:50] Out: 2815 [Urine:1300; Emesis/NG output:1250; Drains:265] Intake/Output this shift: No intake/output data recorded.  Cardiovascular: tachycardic  Abd: wound assessed, good granulation tissue over the majority of the area, the middle inferior area had one small area of brown drainage, other drains in place with good drainage, blake with thick yellow liquid  Extremities: no edema  Neuro: AOx4  Lab Results: CBC   Recent Labs  06/08/17 0525  WBC 15.4*  HGB 9.3*  HCT 28.0*  PLT 635*   BMET  Recent Labs  06/07/17 0658 06/08/17 0525  NA 129* 132*  K 4.4 4.4  CL 93* 95*  CO2 23 27  GLUCOSE 127* 122*  BUN 35* 36*  CREATININE 0.86 0.95  CALCIUM 9.7 9.5   PT/INR No results for input(s): LABPROT, INR in the last 72 hours. ABG No results for input(s): PHART, HCO3 in the last 72 hours.  Invalid input(s): PCO2, PO2  Studies/Results:  Anti-infectives: Anti-infectives    Start     Dose/Rate Route Frequency Ordered Stop   05/29/17 2200  ceFEPIme (MAXIPIME) 2 g in dextrose 5 % 50 mL IVPB  Status:  Discontinued     2 g 100 mL/hr over 30 Minutes Intravenous Every 8 hours 05/29/17 2031 06/03/17 0832   05/29/17 2100  metroNIDAZOLE (FLAGYL) IVPB 500 mg  Status:  Discontinued     500 mg 100 mL/hr over 60 Minutes Intravenous Every 8 hours 05/29/17 2029 06/03/17 0832   05/26/17 2200  ceFAZolin (ANCEF) IVPB 1 g/50 mL premix  Status:   Discontinued     1 g 100 mL/hr over 30 Minutes Intravenous Every 8 hours 05/26/17 1008 05/29/17 2023   05/25/17 1100  vancomycin (VANCOCIN) 1,250 mg in sodium chloride 0.9 % 250 mL IVPB  Status:  Discontinued     1,250 mg 166.7 mL/hr over 90 Minutes Intravenous Every 12 hours 05/25/17 0955 05/26/17 0946   05/22/17 1400  anidulafungin (ERAXIS) 100 mg in sodium chloride 0.9 % 100 mL IVPB     100 mg 78 mL/hr over 100 Minutes Intravenous Every 24 hours 05/21/17 1330 05/27/17 1740   05/21/17 1400  anidulafungin (ERAXIS) 200 mg in sodium chloride 0.9 % 200 mL IVPB     200 mg 78 mL/hr over 200 Minutes Intravenous  Once 05/21/17 1330 05/21/17 1940   05/15/17 1000  anidulafungin (ERAXIS) 100 mg in sodium chloride 0.9 % 100 mL IVPB  Status:  Discontinued     100 mg 78 mL/hr over 100 Minutes Intravenous Every 24 hours 05/14/17 0829 05/16/17 0901   05/15/17 1000  metroNIDAZOLE (FLAGYL) IVPB 500 mg  Status:  Discontinued     500 mg 100 mL/hr over 60 Minutes Intravenous Every 8 hours 05/15/17 0903 05/25/17 0938   05/14/17 0900  anidulafungin (ERAXIS) 200 mg in sodium chloride 0.9 % 200 mL IVPB     200 mg 78 mL/hr over 200 Minutes Intravenous  Once 05/14/17 0829 05/14/17 1325   05/13/17 1927  vancomycin (VANCOCIN) 1-5 GM/200ML-% IVPB    Comments:  Ward, Christa   : cabinet override      05/13/17 1927 05/14/17 0744   05/12/17 1400  ceFEPIme (MAXIPIME) 1 g in dextrose 5 % 50 mL IVPB     1 g 100 mL/hr over 30 Minutes Intravenous Every 8 hours 05/12/17 0939 05/26/17 2359   05/12/17 0900  vancomycin (VANCOCIN) IVPB 1000 mg/200 mL premix  Status:  Discontinued     1,000 mg 200 mL/hr over 60 Minutes Intravenous Every 12 hours 05/12/17 0750 05/16/17 0852   05/12/17 0830  aztreonam (AZACTAM) 2 GM IVPB     2 g 100 mL/hr over 30 Minutes Intravenous  Once 05/12/17 0800 05/12/17 0957   05/06/17 0916  vancomycin (VANCOCIN) IVPB 1000 mg/200 mL premix     1,000 mg 200 mL/hr over 60 Minutes Intravenous On call to  O.R. 05/06/17 0916 05/06/17 1229      Medications: Scheduled Meds: . chlorhexidine  15 mL Mouth Rinse BID  . Chlorhexidine Gluconate Cloth  6 each Topical Q1200  . HYDROmorphone   Intravenous Q4H  . insulin aspart  0-15 Units Subcutaneous Q6H  . ipratropium-albuterol  3 mL Nebulization BID  . lip balm  1 application Topical BID  . LORazepam  2 mg Oral Q6H  . mouth rinse  15 mL Mouth Rinse q12n4p  . nicotine  21 mg Transdermal Daily  . oxyCODONE  10 mg Per Tube Q4H  . pantoprazole (PROTONIX) IV  40 mg Intravenous Q12H  . thiamine injection  100 mg Intravenous Daily   Continuous Infusions: . acetaminophen    . chlorproMAZINE (THORAZINE) IV 12.5 mg (  06/08/17 1517)  . dextrose 5 % and 0.45% NaCl 10 mL/hr at 06/07/17 1729  . TPN (CLINIMIX) Adult without lytes     And  . fat emulsion    . TPN (CLINIMIX) Adult without lytes 83 mL/hr at 06/07/17 1900   PRN Meds:.chlorproMAZINE (THORAZINE) IV, diphenhydrAMINE, HYDROmorphone, labetalol, LORazepam, magic mouthwash, menthol-cetylpyridinium, naloxone **AND** sodium chloride flush, ondansetron **OR** ondansetron (ZOFRAN) IV, phenol, sodium chloride flush  Mickeal Skinner, MD Pg# [phone] [facility]

## 2017-06-08 NOTE — Consult Note (Addendum)
[place] Nurse wound consult note Reason for Consult: NPWT dressing change.  Dr. Kieth Brightly at bedside for wound assessment during NPWT dressing change.  Wife present, I am assisted in today's dressing change by Critical Care Resident RN x 2. Wound type: Surgical Pressure Injury POA: N/A Measurement: Not measured today Wound bed: 90% red, granulating, 10% area at the inferior middle of the wound with brown/feculent material noted.  Unable to express any more than a few drops through mesh with gentle exploration. Drainage (amount, consistency, odor) As above. Periwound: Periwound tissue is intact, clear. Dressing procedure/placement/frequency: Black foam removed from wound, wound cleansed with NS.  White foam (1piece) used to cover center of wound (over mesh), black foam (3 pieces) used to cover remainder of wound defect. Dr. Kieth Brightly requests to visualize wound again tomorrow; we will plan for NPWT change again in the am. [place] nursing team will follow, and will remain available to this patient, the nursing, surgical and medical teams.   Thanks, Maudie Flakes, MSN, RN, [place], Arther Abbott  Pager# [phone]

## 2017-06-08 NOTE — Care Management Note (Signed)
Case Management Note  Patient Details  Name: [name] [name] MRN: [id number] Date of Birth: 01-27-1961  Subjective/Objective:       Remains npo with ngtube on iv tpn/s/p r adenalectomy [id number]             Action/Plan: Date:  June 08, 2017  Chart reviewed for concurrent status and case management needs.  Will continue to follow patient progress.  Discharge Planning: following for needs  Expected discharge date: [id number]  [name], BSN, [place], [place]   Expected Discharge Date:                  Expected Discharge Plan:  Home/Self Care  In-House Referral:     Discharge planning Services  CM Consult  Post Acute Care Choice:    Choice offered to:     DME Arranged:    DME Agency:     HH Arranged:    HH Agency:     Status of Service:  In process, will continue to follow  If discussed at Long Length of Stay Meetings, dates discussed:    Additional Comments:  Leeroy Cha, RN 06/08/2017, 9:23 AM

## 2017-06-08 NOTE — Progress Notes (Signed)
Red tinged secretions noted in both patient's NG tube and ileostomy tube drainage. Spoke with the on-call physician for [facility] (Dr. Lucia Gaskins), he advised that patient should continue to be closely monitored, and CBC should be checked in the morning. Patient's Hgb this AM was 9.3, his HR is currently 105, BP 110/75 (86). He is laying in bed in no acute distress. Wife at bedside,I made her aware of the plan.

## 2017-06-08 NOTE — Progress Notes (Signed)
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 Patient's course was complicated by small bowel injury requiring resection with anastomoses and placement of wound VAC. On 7/18, patient developed leak and was taken back to the operating room for redo of his exploratory laparotomy and repair of leak. That time, patient returned to the ICU on the ventilator. Critical care was consulted for ventilator/medical management.  Over the next 2 weeks, patient's hospital course was complicated with the following: 07/19 - NGT pulled out and almost self-extubated. Developed cuff leak >> tube exchanged. Back to OR emergently for intestinal leak/ischemic bowel.TPN started at that time 07/22 - Back to OR for wash out >> left with open abdomen & wound vac in place. Transfused 1u PRBC. 07/25 - rising temp, WBC increased. Returned to OR for closure 07/27 - Recultured, sedation down on precedex [facility] 7/27 >> MRSE 1/2 > contaminant ? 07/30 - Remains on sedation, VAC leak over the weekend with dressing change by [facility] 07/31 - Initial attempt at weaning failed to due to tachypnea/anxiety 08/02 - Fever 101.1, WBC 14.2, weaned / extubated 8/3 - Extubated for 1 day, tolerating well. O2 weaned to 1L. Remains on dilaudid, initially on gtt now PCA. RN reports precedex started overnight for anxiety. 8/4: Ultrasound-guided aspiration By interventional radiology of small perihepatic fluid collection yielded only 1 mL of the fluid, sent for culture. No growth yet 8/6, patient improving. Off Precedex drip  since 8/4. White blood cell count staying elevated at 14-16 for past 10 days. Hemoglobin Stable for the past 2 weeks between 7-9. Still on TPN 8/8: wound vac changed per surgery. WBC 16. 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/2017 Active Problems:   GERD   Tobacco abuse   COPD (chronic obstructive pulmonary disease) (HCC)   Essential hypertension   Hyperthyroidism   H/O total adrenalectomy ([place])   Intra-abdominal adhesions s/p SB resection 05/06/2017   Chronic narcotic use   Anxiety state   Hypokalemia   Anastomotic leak of intestine   Severe sepsis (H[facility])   Wound dehiscence, surgical   Aspiration pneumonia (H[facility])   Acute respiratory failure with hypoxemia (H[facility])   Ileus ([facility])   Mucus plugging of bronchi   Pressure injury of skin   Peritonitis ([place])   Sepsis -due to peritonitis and MSSA HCAP.  Patient has completed antibiotics for these as outlined below.  His white count has been overall stable in the last couple of weeks, and antibiotics were discontinued by general surgery on 8/8.    Acute respiratory failure with hypoxia -Secondary to sepsis and HCAP post op adrenalectomy.  Currently he is stable on nasal cannula.  He was intubated and has been extubated since 05/28/2017.  Continue DuoNeb's. -Wean off oxygen as tolerated  Sinus tachycardia -ongoing, ?related to anxiety  -will get an echo for completeness  Peritonitis/intestinal perforation/anastomotic leak -05/29/2017 CT abdomen and pelvis thick walled fluid collection along anterior and lateral aspects of the liver; extends 20 cm along anterior and lateral margins of liver.  Interventional  radiology was consulted and on 05/30/2017 he underwent aspiration.  Cultures have remained negative. -05/20/17--ABDOMINAL WOUND [place] OUT, WOUND CLOSURE AND WOUND VAC PLACEMENT.  Wound vacs changed 8/8 -NG remains in place, patient will need to be on TPN for quite some time per  general surgery -Monitor off of antibiotics, discontinued on 8/8 -Remains afebrile  Hyponatremia -Suspect due to TPN, also need to be very careful regarding volume status as he can be prone for fluid overload given poor mobility/TPN, will reinstate daily weights. -today's weight 178 from 205 11 days ago ?? real -osmolalities OK, no fluid overload on clinical exam.  -continue to monitor, Na stable, improving to 132 today  -TSH normal  MSSA HCAP -finished 7 days of cefepime, afebrile, white count is improving, stable respiratory status  Acute blood loss anemia -Baseline Hgb ~12 prior to admission, currently ~ 10. Stable -Continue PPI -There were some concerns for coffee-ground return in his NG tube therefore he is on SCDs and his Lovenox is on hold  R-adrenal mass/pheochromocytoma -s/p R-adrenalectomy, surgery was complicated by small bowel injury in the setting of adhesions with sepsis secondary to intra-abdominal infection -post-op care per surgery  Acute metabolic encephalopathy -multifactorial including sepsis, infection, electrolyte derangement, opioids/benzos -overall improving  Thrombocytosis -likely acute phase reactant and possible iron deficiency -pt had normal platelet count at time of admission  Essential Hypertension -remains stable off home clonidine, labetalol, chlorthalidone, valsartan  Chronic pain syndrome/Post-op pain -had significant post-op pain -remains on dilaudid PCA, po oxycodone scheduled, ativan scheduled -pain management per [facility] -previously on home gaba, percocet  Hyperlipidemia -remain off lipitor until able to tolerate po reliably  Tobacco abuse -nicoderm patch  Hyperglycemia -likely stress induced and TPN -A1C was 5.6, not a diabetic -continue novolog sliding scale    DVT prophylaxis: SCDs Code Status: Full code Family Communication: no family at bedside Disposition Plan: dispo per general surgery  Procedures:   As per  progress note  Antimicrobials: Vancomycin 7/17 >>>7/21; 7/30 >>8/1 Eraxis 7/19 >>> 7/21, 7/26 >> 8/1 Cefazolin 7/31>>>8/3 Cefepime 7/17 >>>7/31 ; 8/3>>>8/8 Flagyl 7/20 >>> 7/30; 8/3>>> 8/8  Subjective: -feels anxious.   Objective: Vitals:   06/08/17 0800 06/08/17 0859 06/08/17 0900 06/08/17 0905  BP: 109/69 109/69    Pulse: (!) 125 (!) 125 (!) 120   Resp: (!) 24 (!) 26 (!) 25 (!) 27  Temp: 98.1 F (36.7 C)     TempSrc: Oral     SpO2: 93% 97% 97% 96%  Weight:      Height:        Intake/Output Summary (Last 24 hours) at 06/08/17 1100 Last data filed at 06/08/17 0905  Gross per 24 hour  Intake          2096.14 ml  Output             2815 ml  Net          -718.86 ml   Filed Weights   06/06/17 1000 06/07/17 0603 06/08/17 0500  Weight: 85.1 kg (187 lb 9.8 oz) 78.1 kg (172 lb 2.9 oz) 79.2 kg (174 lb 9.7 oz)    Examination:  Vitals:   06/08/17 0800 06/08/17 0859 06/08/17 0900 06/08/17 0905  BP: 109/69 109/69    Pulse: (!) 125 (!) 125 (!) 120   Resp: (!) 24 (!) 26 (!) 25 (!) 27  Temp: 98.1 F (36.7 C)     TempSrc: Oral     SpO2: 93% 97% 97% 96%  Weight:  Height:       Constitutional: NAD Respiratory: CTA biL Cardiovascular: RRR  Data Reviewed: I have independently reviewed following labs and imaging studies   CBC:  Recent Labs Lab 06/02/17 0413 06/03/17 0723 06/04/17 0317 06/08/17 0525  WBC 15.3* 16.1* 13.2* 15.4*  NEUTROABS 9.9* 10.8* 8.4* 8.9*  HGB 8.9* 9.7* 10.5* 9.3*  HCT 26.1* 29.0* 30.7* 28.0*  MCV 79.3 79.0 79.5 79.5  PLT 898* 889* 780* 709*   Basic Metabolic Panel:  Recent Labs Lab 06/04/17 0317 06/05/17 0308 06/05/17 0334 06/06/17 1159 06/07/17 0658 06/08/17 0525  NA 129* 129*  --  129* 129* 132*  K 4.9 4.5  --  4.5 4.4 4.4  CL 98* 97*  --  96* 93* 95*  CO2 22 22  --  24 23 27   GLUCOSE 119* 135*  --  132* 127* 122*  BUN 22* 26*  --  31* 35* 36*  CREATININE 0.71 0.68  --  0.82 0.86 0.95  CALCIUM 9.3 9.3  --  9.7 9.7 9.5    MG 1.8  --  1.6* 1.9 1.9 1.7  PHOS 5.6*  --  4.9*  --  6.5* 5.8*   GFR: Estimated Creatinine Clearance: 89.6 mL/min (by C-G formula based on SCr of 0.95 mg/dL). Liver Function Tests:  Recent Labs Lab 06/04/17 0317 06/05/17 0308 06/06/17 1159 06/08/17 0525  AST 35 31 35 32  ALT 28 25 31 27   ALKPHOS 257* 245* 259* 212*  BILITOT 0.7 0.8 1.0 0.8  PROT 7.7 7.7 8.4* 7.8  ALBUMIN 2.6* 2.6* 2.9* 2.8*   No results for input(s): LIPASE, AMYLASE in the last 168 hours. No results for input(s): AMMONIA in the last 168 hours. Coagulation Profile: No results for input(s): INR, PROTIME in the last 168 hours. Cardiac Enzymes: No results for input(s): CKTOTAL, CKMB, CKMBINDEX, TROPONINI in the last 168 hours. BNP (last 3 results) No results for input(s): PROBNP in the last 8760 hours. HbA1C: No results for input(s): HGBA1C in the last 72 hours. CBG:  Recent Labs Lab 06/07/17 0601 06/07/17 1221 06/07/17 1807 06/07/17 2353 06/08/17 0602  GLUCAP 137* 143* 131* 134* 128*   Lipid Profile: No results for input(s): CHOL, HDL, LDLCALC, TRIG, CHOLHDL, LDLDIRECT in the last 72 hours. Thyroid Function Tests: No results for input(s): TSH, T4TOTAL, FREET4, T3FREE, THYROIDAB in the last 72 hours. Anemia Panel: No results for input(s): VITAMINB12, FOLATE, FERRITIN, TIBC, IRON, RETICCTPCT in the last 72 hours. Urine analysis:    Component Value Date/Time   COLORURINE AMBER (A) 05/22/2017 1300   APPEARANCEUR CLEAR 05/22/2017 1300   LABSPEC 1.021 05/22/2017 1300   PHURINE 6.0 05/22/2017 1300   GLUCOSEU NEGATIVE 05/22/2017 1300   HGBUR SMALL (A) 05/22/2017 1300   BILIRUBINUR NEGATIVE 05/22/2017 1300   KETONESUR NEGATIVE 05/22/2017 1300   PROTEINUR NEGATIVE 05/22/2017 1300   UROBILINOGEN 0.2 02/01/2014 0454   NITRITE NEGATIVE 05/22/2017 1300   LEUKOCYTESUR SMALL (A) 05/22/2017 1300   Sepsis Labs: Invalid input(s): PROCALCITONIN, LACTICIDVEN  Recent Results (from the past 240 hour(s))   Body fluid culture     Status: None   Collection Time: 05/30/17 12:16 PM  Result Value Ref Range Status   Specimen Description FLUID ASPIRATE  Final   Special Requests SMALL PERIHEPATIC FLUID  Final   Gram Stain   Final    FEW WBC PRESENT, PREDOMINANTLY PMN NO ORGANISMS SEEN    Culture   Final    NO GROWTH 3 DAYS Performed at [facility], [address] [place], [place] [address]    Report Status 06/04/2017 FINAL  Final      Radiology Studies: No results found.   Scheduled Meds: . chlorhexidine  15 mL Mouth Rinse BID  . Chlorhexidine Gluconate Cloth  6 each Topical Q1200  . HYDROmorphone   Intravenous Q4H  . insulin aspart  0-15 Units Subcutaneous Q6H  . ipratropium-albuterol  3 mL Nebulization BID  . lip balm  1 application Topical BID  . LORazepam  2 mg Oral Q6H  . mouth rinse  15 mL Mouth Rinse q12n4p  . nicotine  21 mg Transdermal Daily  . oxyCODONE  10 mg Per Tube Q4H  . pantoprazole (PROTONIX) IV  40 mg Intravenous Q12H  . thiamine injection  100 mg Intravenous Daily   Continuous Infusions: . acetaminophen    . chlorproMAZINE (THORAZINE) IV 12.5 mg (06/08/17 7062)  . dextrose 5 % and 0.45% NaCl 10 mL/hr at 06/07/17 1729  . TPN (CLINIMIX) Adult without lytes     And  . fat emulsion    . piperacillin-tazobactam (ZOSYN)  IV    . TPN (CLINIMIX) Adult without lytes 83 mL/hr at 06/07/17 1900    Marzetta Board, MD, PhD [facility] Pager [phone]  If 7PM-7AM, please contact night-coverage [email or web address] Password [email or web address] 06/08/2017, 11:00 AM

## 2017-06-08 NOTE — Progress Notes (Addendum)
[place] NOTE   Pharmacy Consult for TPN Indication: prolonged ileus, small bowel leak, multiple bowel surgeries  Patient Measurements: Body mass index is 25.05 kg/m. Filed Weights   06/06/17 1000 06/07/17 0603 06/08/17 0500  Weight: 187 lb 9.8 oz (85.1 kg) 172 lb 2.9 oz (78.1 kg) 174 lb 9.7 oz (79.2 kg)   HPI: 53 yoM admitted on 7/11 for enlarging adrenal mass and planned adrenalectomy.  Pharmacy consulted to dose TPN.  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   7/22 OR:  wash out, tube ileostomy, left with open abdomen & wound vac in place.  Plan for OR on 7/25 for closure 7/25 OR: mesh placed for abdominal closure, wound vac 8/2 extubated, propofol drip off 8/4 TPN off for ~ 5 hours due to line detached from filter 8/10 MD request decrease rate x 1 week 2nd low sodium 8/12 Additional sodium added to TPN due to persistent hyponatremia  Insulin requirements past 24 hours: 10 units SSI (no hx DM)  Current Nutrition: NPO, ice chips  IVF: D5 1/2NS at10 ml/hr  Central access: PICC line ordered 7/19, placed on 7/20 TPN start date: 7/20  ASSESSMENT                                                                                                           Today, 06/08/17  - Glucose: at goal (goal < 150) - Electrolytes:  Na low but improved to 132 with the addition of sodium to TPN: now with a total of 154 meq Na/L - equivalent to NS, standard TPN has ~ 70 meq/L  Phos high 5.8, CoCa 10.46, CaxPhos = ~ 61  goal < 55   K 4.4, stable,   Mag 1.7  - Renal:  SCr wnl, stable.  I/O: -160,   NGO 1250 - Weight:79.2,  Was 93.3kg on 7/31 - GI: VAC removed, drains in - LFTs: WNL x alk phos 212.   Tbili WNL  - TGs:  296 (7/18), 266 (7/20), 269 (7/23), 210 (7/28), 285 (7/31) 301 (8/3), will check in AM  - Prealbumin:  <5 (7/20), 6.1 (7/24), 10.1 (7/30) 24.8 (8/6), 26.4 today  NUTRITIONAL GOALS                                                                                             RD recs (  8/10):  122-138 gms Protein (1.5 - 1.7 g/kg) 2030 - 2270 Kcal  Clinimix  E 5/15 at a max fluid volume of 3L at 120 ml/hr  + 20% fat emulsion 240 ml/day to provide: 144 g of protein and 2568 kCals per day - Glucose infusion rate will be 3.2 mg/kg/min (Maximum 5 mg/kg/min)  Clinimix 5/20 (no electrolytes) at rate of 115 ml/hr with lipids 3x/week on MWF only would provide: 138g protein and avg 2174 kcal/day - Glucose infusion rate will be 4.11 mg/kg/min (Maximum 5 mg/kg/min)   PLAN                                                                                                                          8/10 Discussed with Dr. Kieth Brightly 8/10 - concerned about hyponatremia, possibly due to high-volume TPN. Requested to decrease rate for a short time (~1 week) and monitor sodium levels closely. Understands that this will decrease the amount of protein the patient will be receiving for the time being but prealbumin has improved and wound is healing. Also wants to add back electrolytes since phosphorus has improved today - will monitor this closely as well.  At 1800 today:  Continue no electrolyte formula due to elevated calcium-phosphorus product but continue to add sodium 154 meq/L and continue rate of 83 ml/hr (~2L volume per day) ? Lipids at 51ms/hr x 12 hours  ? This provides ~ 100 gm protein and 1894 Kcals  TPN to contain standard multivitamins and trace elements daily  [place] q[place]h.  TPN lab panels on Mondays & Thursdays.  Check BMET and Phos  in AM  EPeggyann Juba PharmD, BCPS Pager: [phone]/13/2018 7:05 AM  Addendum: Consulted to dose Zosyn for possible wound infection  Antimicrobials this admission: 7/17 Vancomcyin >> 7/21, resume 7/30 >> 7/31 7/17 Cefepime >> 7/31, resume 8/3 >> 8/8 7/19 Anidulafungin >> 7/21, resume 7/26 >>  7/20 metronidazole >> 7/30, resume 8/3 >> 8/8 7/31 Cefazolin >> 8/3 8/13 Zosyn >>  Dose adjustments this admission:  Microbiology results: 7/11 MRSA PCR: negative 7/18 Abdominal culture:  moderate Klebsiella pna - Pan sens except amp 7/19 BCx: NGF 7/22 Trach asp: multple orgs, none predominant 7/27 Trach asp: rare MSSA 7/27 BCx: 1/2 CoNS 8/4 perihepatic fluid: ngtd  Plan: - Zosyn 3.375gm IV q8h (4hr extended infusions) - No further dosage adjustments needed, Pharmacy will sign-off  EPeggyann Juba PharmD, BCPS 06/08/2017 10:58 AM

## 2017-06-08 NOTE — Progress Notes (Signed)
  Echocardiogram 2D Echocardiogram has been performed.  [name] [name] M 06/08/2017, 3:34 PM

## 2017-06-08 NOTE — Progress Notes (Signed)
PT Cancellation Note  Patient Details Name: [name] [name] MRN: [id number] DOB: 29-Feb-1961   Cancelled Treatment:     wound VAC change this am.  Was unable to return in pm.  Will attempt to see another day as schedule permits.  Pt has been evaluated by PT with rec for SNF   [name]  PTA [facility]  Acute  Rehab Pager      [phone]

## 2017-06-09 ENCOUNTER — Inpatient Hospital Stay (HOSPITAL_COMMUNITY): Payer: Medicare Other

## 2017-06-09 LAB — BASIC METABOLIC PANEL
Anion gap: 12 (ref 5–15)
BUN: 32 mg/dL — AB (ref 6–20)
CHLORIDE: 94 mmol/L — AB (ref 101–111)
CO2: 30 mmol/L (ref 22–32)
CREATININE: 0.98 mg/dL (ref 0.61–1.24)
Calcium: 9.3 mg/dL (ref 8.9–10.3)
GFR calc Af Amer: >60 mL/min (ref >60)
GFR calc non Af Amer: >60 mL/min (ref >60)
GLUCOSE: 150 mg/dL — AB (ref 65–99)
Potassium: 3.9 mmol/L (ref 3.5–5.1)
Sodium: 136 mmol/L (ref 135–145)

## 2017-06-09 LAB — CBC
HEMATOCRIT: 26.6 % — AB (ref 39.0–52.0)
Hemoglobin: 8.4 g/dL — ABNORMAL LOW (ref 13.0–17.0)
MCH: 25.1 pg — AB (ref 26.0–34.0)
MCHC: 31.6 g/dL (ref 30.0–36.0)
MCV: 79.4 fL (ref 78.0–100.0)
Platelets: 560 10*3/uL — ABNORMAL HIGH (ref 150–400)
RBC: 3.35 MIL/uL — ABNORMAL LOW (ref 4.22–5.81)
RDW: 16.7 % — AB (ref 11.5–15.5)
WBC: 13.8 10*3/uL — ABNORMAL HIGH (ref 4.0–10.5)

## 2017-06-09 LAB — GLUCOSE, CAPILLARY
GLUCOSE-CAPILLARY: 115 mg/dL — AB (ref 65–99)
Glucose-Capillary: 123 mg/dL — ABNORMAL HIGH (ref 65–99)
Glucose-Capillary: 126 mg/dL — ABNORMAL HIGH (ref 65–99)
Glucose-Capillary: 136 mg/dL — ABNORMAL HIGH (ref 65–99)

## 2017-06-09 LAB — PHOSPHORUS: Phosphorus: 5.7 mg/dL — ABNORMAL HIGH (ref 2.5–4.6)

## 2017-06-09 LAB — TRIGLYCERIDES: Triglycerides: 323 mg/dL — ABNORMAL HIGH (ref <150)

## 2017-06-09 MED ORDER — IOPAMIDOL (ISOVUE-300) INJECTION 61%
15.0000 mL | Freq: Once | INTRAVENOUS | Status: DC | PRN
  Filled 2017-06-09: qty 30

## 2017-06-09 MED ORDER — IOPAMIDOL (ISOVUE-300) INJECTION 61%
INTRAVENOUS | Status: AC
  Administered 2017-06-09: 15 mL
  Filled 2017-06-09: qty 30

## 2017-06-09 MED ORDER — TRACE MINERALS CR-CU-MN-SE-ZN 10-1000-500-60 MCG/ML IV SOLN
INTRAVENOUS | Status: AC
  Administered 2017-06-09: 18:00:00 via INTRAVENOUS
  Filled 2017-06-09: qty 1992

## 2017-06-09 MED ORDER — IOPAMIDOL (ISOVUE-300) INJECTION 61%
100.0000 mL | Freq: Once | INTRAVENOUS | Status: AC | PRN
  Administered 2017-06-09: 100 mL via INTRAVENOUS

## 2017-06-09 MED ORDER — FAT EMULSION 20 % IV EMUL
240.0000 mL | INTRAVENOUS | Status: AC
  Administered 2017-06-09: 240 mL via INTRAVENOUS
  Filled 2017-06-09: qty 250

## 2017-06-09 MED ORDER — IOPAMIDOL (ISOVUE-300) INJECTION 61%
INTRAVENOUS | Status: AC
  Filled 2017-06-09: qty 100

## 2017-06-09 NOTE — Progress Notes (Signed)
Spoke with patient's wife, Retia. Requested a new physician be assigned to this patient.   I paged Dr. Kieth Brightly to notify him of this request. Dr. Kieth Brightly will notify his partners and a new physician will be assigned.

## 2017-06-09 NOTE — Consult Note (Signed)
[facility] Nurse wound consult note Reason for Consult:Change of NPWT with Dr. Kieth Brightly for monitoring of area with brown/feculent material beneath mesh at 6 o'clock. Wound type:Surgical Pressure Injury POA: N/A Measurement:6cm x 24 x 2cm Wound bed:90% red, mesh evident, particularly at periphery. Area measuring 3cm x 2cm at 6 o'clock with small collection of brown/feculent material noted. Assessed with Dr. Kieth Brightly.   Drainage (amount, consistency, odor) Tan drainage in NPWT tubing. Will change cannister to monitor. Feculent odor noted. Periwound: Some periwound maceration (moisture associated skin damage) noted from 5-7 o'clock, medical adhesive related skin injury (MARSI) from 11-2 o'clock.  Both areas are pretreated with NPWT drape prior to dressing application. Adhesive releaser used to remove NPWT dressing (supplied to room) and also tube dressings (x2). Dressing procedure/placement/frequency: NPWT dressing applied yesterday removed using adhesive releaser ([employer]). 3 pieces of black foam and 1 piece of white foam removed.  Wound cleansed with NS and gently patted dry.  White foam trimmed and used to cover central portion of wound. Three pieces of black foam used to obliterate dead space. I am assisted in the dressing change by bedside RN and observed by Residency Program RN. Drape applied and system attached to negative pressure, 118mmHg continuous.  An immediate seal is achieved. Patient uses PCA for pain control x3 during procedure. Plan to change NPWT on Thursday and Saturday this week [facility] Nurse to perform dressing changes) and return to M/W/F schedule next week. [facility] Nurse to page Dr. Kieth Brightly for Thursday dressing change between 10-11:30am. [facility] nursing team will follow, and will remain available to this patient, the nursing and medical teams.  Please re-consult if needed. Thanks, Maudie Flakes, MSN, RN, Bernice, Arther Abbott  Pager# [phone]

## 2017-06-09 NOTE — Progress Notes (Signed)
Physical Therapy Treatment Patient Details Name: [name] [name] MRN: [id number] DOB: [birth year] Today's Date: 06/09/2017    History of Present Illness  56 y.o. 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 On 7/18, he developed a leak therefore was taken back to OR for re-do of ex-lap and repair of leak on 05/20/17.  He returned to the ICU on the vent . Extubated on 05/28/17.    PT Comments    Supine: BP 116/70, O2 95%, HR 92 EOB     BP 110/29, O2 96%, HR 94 mod c/o dizziness  Assisted to recliner only and positioned to comfort.   Follow Up Recommendations  SNF;Supervision/Assistance - 24 hour     Equipment Recommendations  Rolling walker with 5" wheels    Recommendations for Other Services Rehab consult     Precautions / Restrictions Precautions Precautions: Fall Precaution Comments: multiple lines in abdomen, VAC, NG, and JP drain Restrictions Weight Bearing Restrictions: No    Mobility  Bed Mobility Overal bed mobility: Needs Assistance Bed Mobility: Supine to Sit     Supine to sit: Mod assist;+2 for physical assistance;+2 for safety/equipment     General bed mobility comments: assist for trunk upright, pt more awake/alert today and able to self assist better, +2 for lines  Transfers Overall transfer level: Needs assistance Equipment used: 2 person hand held assist Transfers: Sit to/from Stand;Stand Pivot Transfers   Stand pivot transfers: Mod assist;+2 physical assistance;+2 safety/equipment       General transfer comment: bed to recliner transfer only due to hypotension.  BP decreased from 116/70 to 100/29 with c/o dizziness  Ambulation/Gait             General Gait Details: transfers only this session due to decreased BP with positive c/o dizziness   Stairs            Wheelchair Mobility    Modified Rankin (Stroke Patients Only)       Balance                                            Cognition Arousal/Alertness: Awake/alert Behavior During Therapy: WFL for tasks assessed/performed Overall Cognitive Status: Within Functional Limits for tasks assessed                                        Exercises      General Comments        Pertinent Vitals/Pain Pain Assessment: Faces Faces Pain Scale: Hurts little more Pain Location: abdomen Pain Descriptors / Indicators: Grimacing;Sore Pain Intervention(s): Monitored during session    Home Living                      Prior Function            PT Goals (current goals can now be found in the care plan section) Progress towards PT goals: Progressing toward goals    Frequency    Min 3X/week      PT Plan Current plan remains appropriate    Co-evaluation  AM-PAC PT "6 Clicks" Daily Activity  Outcome Measure  Difficulty turning over in bed (including adjusting bedclothes, sheets and blankets)?: Total Difficulty moving from lying on back to sitting on the side of the bed? : Total Difficulty sitting down on and standing up from a chair with arms (e.g., wheelchair, bedside commode, etc,.)?: Total Help needed moving to and from a bed to chair (including a wheelchair)?: A Lot Help needed walking in hospital room?: A Lot Help needed climbing 3-5 steps with a railing? : Total 6 Click Score: 8    End of Session Equipment Utilized During Treatment: Gait belt Activity Tolerance: Patient limited by fatigue Patient left: in chair;with call bell/phone within reach;with family/visitor present;with nursing/sitter in room Nurse Communication: Mobility status PT Visit Diagnosis: Difficulty in walking, not elsewhere classified (R26.2);Muscle weakness (generalized) (M62.81)     Time: 1610-9604 PT Time Calculation (min) (ACUTE  ONLY): 30 min  Charges:  $Therapeutic Activity: 23-37 mins                    G Codes:       [name]  PTA [facility]  Acute  Rehab Pager      [phone]

## 2017-06-09 NOTE — Progress Notes (Signed)
Nutrition Follow-up  DOCUMENTATION CODES:   Not applicable  INTERVENTION:  - Continue TPN per Pharmacy; will monitor for ability to increase rate.  - RD will continue to monitor medical course and provide interventions as warranted.   NUTRITION DIAGNOSIS:   Inadequate oral intake related to inability to eat as evidenced by NPO status. -ongoing  GOAL:   Patient will meet greater than or equal to 90% of their needs -unmet for protein, met for kcal with current TPN regimen.   MONITOR:   Weight trends, Labs, Skin, I & O's, Other (Comment) (TPN regimen)  ASSESSMENT:   56 y.o. 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 On 7/18, he developed a leak therefore was taken back to OR for re-do of ex-lap and repair of leak.  Significant events: 7/11 OR: right adrenalectomy with complication of intestinal injury and small bowel resection with anastomosis  7/18 OR: repair of small bowel anastomotic leak, abdominal closure of wound dehisence 7/19 OR: reopening of recent laparotomy, lysis of adhesions, noted ischemic perforation of new loop of intestine, intact repair of previous anastomotic leak repair, intact previous anastomosis.  7/22 OR: wash out, tube ileostomy, left with open abdomen &wound vac in place. Plan for OR on 7/25 for closure 7/25 OR: mesh placed for abdominal closure, wound vac 8/2  extubated, propofol drip off 8/4  TPN off for ~ 5 hours due to line detached from filter. Ultrasound-guided aspiration By interventional radiology of small perihepatic fluid collection yielded only 1 mL of the fluid, sent for culture. NPO - mild coffee ground on NG but no active bleed 8/6  patient improving. Off Precedex drip since 8/4. Dilaudid Drip being changed over to PCA. Still with some coffee ground-looking  material in NG tube.  8/10  hyponatremia and plan to decrease TPN from 3L/day to 2L/day x1 week 8/13  found to have fecal matter in wound vac 8/14  increase in fecal matter presence in wound vac with no plan for surgery at this time   8/14 Information from rounds yesterday and today outlined above. Pt remains with NGT to suction with 600cc output from night shift. Pt with triple lumen PICC. Plan per Pharmacy note this AM is to continue (since 8/11) Clinimix 5/15 @ 83 mL/hr (electrolyte-free d/t hyperphosphatemia today) with 20% ILE @ 20 mL/hr x12 hours. This regimen will provide 100 grams of protein (82% minimum estimated protein need) and 1894 kcal (93% minimum estimated kcal need). Weight -1.6 kg/3 lb from 8/10.  Medications reviewed; sliding scale Novolog, 40 mg IV Protonix BID, 100 mg IV thiamine/day. Labs reviewed; CBG: 123 mg/dL, Cl: 94 mmol/L, BUN: 32 mg/dL, Phos: 5.7 mg/dL.  IVF: D5-1/2 NS @ 10 mL/hr (41 kcal from dextrose).     40/10 - Spoke with Pharmacist who states that she spoke with Dr. Reece Agar. - He is concerned about hyponatremia being caused by volume overload and requests decreasing TPN from 3L/day to 2L/day x1 week.  - Reassessment to occur in 1 week concerning TPN rate.  - Dr. Reece Agar encouraged Pharmacist to maintain lytes in TPN despite hyperphosphatemia.  - Weight -12.2 kg/27 lb since 7/31 so this RD updated estimated nutrition needs accordingly. - Previously using weight of 89.4 kg to estimate needs as this was an average of weight, which had been stable, from 03/06/15-05/04/17. - Pharmacist reported that Dr.  Kisinger stated that abdominal wound is healing well.  - He reported no plan for diet advancement in the immediate future.  - Per Pharmacy note this AM, plan for Clinimix E 5/15 @ 83 mL/hr (2L/day) with 20% ILE @ 20 mL/hr x12 hours. This regimen will provide 100 grams of protein (82% minimum estimated protein need) and 1894 kcal (93% minimum estimated kcal need).   - NGT remains to LIS with 350cc output during night shift.   Na: 129 mmol/L, Phos: 4.9 mg/dL, Mg: 1.6 mg/dL IVF: D5-1/2 NS @ 10 mL/hr (41 kcal from dextrose).    Diet Order:  Diet NPO time specified Except for: Ice Chips TPN [facility]) Adult without lytes TPN (CLINIMIX) Adult without lytes  Skin:  Wound (see comment) (Incisions to R flank and abdomen from 7/11 and 7/18)  Last BM:  8/12 via ileostomy  Height:   Ht Readings from Last 1 Encounters:  05/26/17 _0  (1.778 m)    Weight:   Wt Readings from Last 1 Encounters:  06/09/17 175 lb 4.3 oz (79.5 kg)    Ideal Body Weight:  75.45 kg  BMI:  Body mass index is 25.15 kg/m.  Estimated Nutritional Needs:   Kcal:  2030-2270 (25-28 kcal/kg)  Protein:  122-138 grams (1.5-1.7 grams/kg)  Fluid:  2 L/day  EDUCATION NEEDS:   No education needs identified at this time    Jarome Matin, MS, RD, LDN, CNSC Inpatient Clinical Dietitian Pager # [phone] After hours/weekend pager # [phone]

## 2017-06-09 NOTE — Progress Notes (Signed)
Progress Note: General Surgery Service   Assessment/Plan: Patient Active Problem List   Diagnosis Date Noted  . Peritonitis ([place]) 06/01/2017  . Pressure injury of skin 05/29/2017  . Mucus plugging of bronchi   . Acute respiratory failure with hypoxemia ([place])   . Ileus ([place])   . Hypokalemia 05/14/2017  . Anastomotic leak of intestine 05/14/2017  . Severe sepsis ([place]) 05/14/2017  . Wound dehiscence, surgical 05/14/2017  . Aspiration pneumonia ([place]) 05/14/2017  . Chronic narcotic use 05/10/2017  . Anxiety state 05/10/2017  . Intra-abdominal adhesions s/p SB resection 05/06/2017 05/09/2017  . 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 Right adrenal mass s/p adrenalectomy 05/06/2017 05/06/2017  . Throat symptom 03/18/2016  . Multinodular goiter 01/19/2016  . Hyperthyroidism 01/19/2016  . Essential hypertension   . Diarrhea 11/30/2014  . Anemia, iron deficiency 11/01/2014  . Leukocytosis 11/03/2013  . Tobacco abuse 07/26/2013  . COPD (chronic obstructive pulmonary disease) ([place]) 07/26/2013  . Left knee pain 07/26/2013  . Pain in joint, shoulder region 05/03/2013  . GERD 07/24/2008  . 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   s/p Procedure(s): ABDOMINAL WOUND [place] OUT, WOUND CLOSURE AND WOUND VAC PLACEMENT 05/20/2017 -empiric abx for concern of stool leakage -white sponge to affected area and black sponge over the rest -ct today to evaluate underdrained  Anxiety -continue intermittent ativan  Pain control -continue dilaudid PCA -continue oxycodone per tube  Hyponatremia -improved  Leak after intestinal resection -continue TPN -continue ileostomy tubes to drainage -continue NG tube   LOS: 34 days  Chief Complaint/Subjective: Continues to have anxiety issues  Objective: Vital signs in last 24 hours: Temp:  [97.3 F (36.3 C)-100.6 F (38.1 C)] 98.4 F (36.9 C) (08/14 0800) Pulse Rate:  [98-126] 108 (08/14 0900) Resp:  [19-27] 22 (08/14 0900) BP:  (91-126)/(66-79) 99/71 (08/14 0800) SpO2:  [92 %-96 %] 96 % (08/14 0900) Weight:  [79.5 kg (175 lb 4.3 oz)] 79.5 kg (175 lb 4.3 oz) (08/14 0500) Last BM Date: 06/07/17 (has an ileostomy)  Intake/Output from previous day: 08/13 0701 - 08/14 0700 In: 2443 [I.V.:2148; IV Piggyback:250] Out: 1660 [Urine:525; Emesis/NG output:600; Drains:535] Intake/Output this shift: Total I/O In: 100 [IV Piggyback:100] Out: 275 [Urine:275]  Lungs: CTAB  Cardiovascular: tachycardic  Abd: wound open with feculent drainage from middle inferior portion of the wound, other areas with healthy granulation tissue  Extremities: no edema  Neuro: AOx4  Lab Results: CBC   Recent Labs  06/08/17 0525 06/09/17 0600  WBC 15.4* 13.8*  HGB 9.3* 8.4*  HCT 28.0* 26.6*  PLT 635* 560*   BMET  Recent Labs  06/08/17 0525 06/09/17 0600  NA 132* 136  K 4.4 3.9  CL 95* 94*  CO2 27 30  GLUCOSE 122* 150*  BUN 36* 32*  CREATININE 0.95 0.98  CALCIUM 9.5 9.3   PT/INR No results for input(s): LABPROT, INR in the last 72 hours. ABG No results for input(s): PHART, HCO3 in the last 72 hours.  Invalid input(s): PCO2, PO2  Studies/Results:  Anti-infectives: Anti-infectives    Start     Dose/Rate Route Frequency Ordered Stop   06/08/17 1200  piperacillin-tazobactam (ZOSYN) IVPB 3.375 g     3.375 g 12.5 mL/hr over 240 Minutes Intravenous Every 8 hours 06/08/17 1056     05/29/17 2200  ceFEPIme (MAXIPIME) 2 g in dextrose 5 % 50 mL IVPB  Status:  Discontinued     2 g 100 mL/hr over 30 Minutes Intravenous Every 8 hours 05/29/17 2031 06/03/17  7425   05/29/17 2100  metroNIDAZOLE (FLAGYL) IVPB 500 mg  Status:  Discontinued     500 mg 100 mL/hr over 60 Minutes Intravenous Every 8 hours 05/29/17 2029 06/03/17 0832   05/26/17 2200  ceFAZolin (ANCEF) IVPB 1 g/50 mL premix  Status:  Discontinued     1 g 100 mL/hr over 30 Minutes Intravenous Every 8 hours 05/26/17 1008 05/29/17 2023   05/25/17 1100  vancomycin  (VANCOCIN) 1,250 mg in sodium chloride 0.9 % 250 mL IVPB  Status:  Discontinued     1,250 mg 166.7 mL/hr over 90 Minutes Intravenous Every 12 hours 05/25/17 0955 05/26/17 0946   05/22/17 1400  anidulafungin (ERAXIS) 100 mg in sodium chloride 0.9 % 100 mL IVPB     100 mg 78 mL/hr over 100 Minutes Intravenous Every 24 hours 05/21/17 1330 05/27/17 1740   05/21/17 1400  anidulafungin (ERAXIS) 200 mg in sodium chloride 0.9 % 200 mL IVPB     200 mg 78 mL/hr over 200 Minutes Intravenous  Once 05/21/17 1330 05/21/17 1940   05/15/17 1000  anidulafungin (ERAXIS) 100 mg in sodium chloride 0.9 % 100 mL IVPB  Status:  Discontinued     100 mg 78 mL/hr over 100 Minutes Intravenous Every 24 hours 05/14/17 0829 05/16/17 0901   05/15/17 1000  metroNIDAZOLE (FLAGYL) IVPB 500 mg  Status:  Discontinued     500 mg 100 mL/hr over 60 Minutes Intravenous Every 8 hours 05/15/17 0903 05/25/17 0938   05/14/17 0900  anidulafungin (ERAXIS) 200 mg in sodium chloride 0.9 % 200 mL IVPB     200 mg 78 mL/hr over 200 Minutes Intravenous  Once 05/14/17 0829 05/14/17 1325   05/13/17 1927  vancomycin (VANCOCIN) 1-5 GM/200ML-% IVPB    Comments:  Ward, Christa   : cabinet override      05/13/17 1927 05/14/17 0744   05/12/17 1400  ceFEPIme (MAXIPIME) 1 g in dextrose 5 % 50 mL IVPB     1 g 100 mL/hr over 30 Minutes Intravenous Every 8 hours 05/12/17 0939 05/26/17 2359   05/12/17 0900  vancomycin (VANCOCIN) IVPB 1000 mg/200 mL premix  Status:  Discontinued     1,000 mg 200 mL/hr over 60 Minutes Intravenous Every 12 hours 05/12/17 0750 05/16/17 0852   05/12/17 0830  aztreonam (AZACTAM) 2 GM IVPB     2 g 100 mL/hr over 30 Minutes Intravenous  Once 05/12/17 0800 05/12/17 0957   05/06/17 0916  vancomycin (VANCOCIN) IVPB 1000 mg/200 mL premix     1,000 mg 200 mL/hr over 60 Minutes Intravenous On call to O.R. 05/06/17 0916 05/06/17 1229      Medications: Scheduled Meds: . chlorhexidine  15 mL Mouth Rinse BID  . Chlorhexidine  Gluconate Cloth  6 each Topical Q1200  . HYDROmorphone   Intravenous Q4H  . insulin aspart  0-15 Units Subcutaneous Q6H  . ipratropium-albuterol  3 mL Nebulization BID  . lip balm  1 application Topical BID  . LORazepam  2 mg Oral Q6H  . mouth rinse  15 mL Mouth Rinse q12n4p  . nicotine  21 mg Transdermal Daily  . oxyCODONE  10 mg Per Tube Q4H  . pantoprazole (PROTONIX) IV  40 mg Intravenous Q12H  . thiamine injection  100 mg Intravenous Daily   Continuous Infusions: . chlorproMAZINE (THORAZINE) IV Stopped (06/09/17 0727)  . dextrose 5 % and 0.45% NaCl 10 mL/hr at 06/08/17 1200  . TPN (CLINIMIX) Adult without lytes     And  .  fat emulsion    . piperacillin-tazobactam (ZOSYN)  IV Stopped (06/09/17 0834)  . TPN (CLINIMIX) Adult without lytes 83 mL/hr at 06/08/17 1856   PRN Meds:.chlorproMAZINE (THORAZINE) IV, diphenhydrAMINE, HYDROmorphone, labetalol, LORazepam, magic mouthwash, menthol-cetylpyridinium, naloxone **AND** sodium chloride flush, ondansetron **OR** ondansetron (ZOFRAN) IV, phenol, sodium chloride flush  Mickeal Skinner, MD Pg# [phone] [facility]

## 2017-06-09 NOTE — Progress Notes (Signed)
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 Patient's course was complicated by small bowel injury requiring resection with anastomoses and placement of wound VAC. On 7/18, patient developed leak and was taken back to the operating room for redo of his exploratory laparotomy and repair of leak. That time, patient returned to the ICU on the ventilator. Critical care was consulted for ventilator/medical management.  Over the next 2 weeks, patient's hospital course was complicated with the following: 07/19 - NGT pulled out and almost self-extubated. Developed cuff leak >> tube exchanged. Back to OR emergently for intestinal leak/ischemic bowel.TPN started at that time 07/22 - Back to OR for wash out >> left with open abdomen & wound vac in place. Transfused 1u PRBC. 07/25 - rising temp, WBC increased. Returned to OR for closure 07/27 - Recultured, sedation down on precedex [facility] 7/27 >> MRSE 1/2 > contaminant ? 07/30 - Remains on sedation, VAC leak over the weekend with dressing change by [facility] 07/31 - Initial attempt at weaning failed to due to tachypnea/anxiety 08/02 - Fever 101.1, WBC 14.2, weaned / extubated 8/3 - Extubated for 1 day, tolerating well. O2 weaned to 1L. Remains on dilaudid, initially on gtt now PCA. RN reports precedex started overnight for anxiety. 8/4: Ultrasound-guided aspiration By interventional radiology of small perihepatic fluid collection yielded only 1 mL of the fluid, sent for culture. No growth yet 8/6, patient improving. Off Precedex drip  since 8/4. White blood cell count staying elevated at 14-16 for past 10 days. Hemoglobin Stable for the past 2 weeks between 7-9. Still on TPN 8/8: wound vac changed per surgery. WBC 16. Abx d/c per surgery  8/13: Patient with increased tachycardia and developed a fever.  Wound VAC noted with feculent material.  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  Patient has been on and off antibiotics for these as outlined below.  His white count has been overall stable in the last couple of weeks, and antibiotics were discontinued by general surgery on 8/8.  Zosyn had to be restarted however on 8/13 due to concern for recurrent sepsis given fever and elevated heart rate.  Peritonitis/intestinal perforation/anastomotic leak -05/29/2017 CT abdomen and pelvis thick walled fluid collection along anterior and lateral aspects of the liver; extends 20 cm along anterior and lateral margins of liver.  Interventional radiology was consulted and on 05/30/2017 he underwent aspiration.  Cultures  have remained negative. -05/20/17--ABDOMINAL WOUND [place] OUT, WOUND CLOSURE AND WOUND VAC PLACEMENT.  Wound vacs changed 8/8 -NG remains in place, patient will need to be on TPN for quite some time per general surgery -His antibiotics were discontinued on 8/8, he was monitor off of all  antibiotics up until 8/13 when he developed a fever again.  He is likely developing peritonitis in the setting of stool leakage. Repeat CT scan of the abdomen and pelvis pending  Acute respiratory failure with hypoxia -Secondary to sepsis and HCAP post op adrenalectomy.  Currently he is stable on nasal cannula.  He was intubated and has been extubated since 05/28/2017.  Continue DuoNeb's. -Wean off oxygen as tolerated  Sinus tachycardia -ongoing, likely related to peritonitis -2D echo obtained 8/13 showed an EF of 55-60% and grade 1 diastolic dysfunction  Hyponatremia -Suspect due to TPN, also need to be very careful regarding volume status as he can be prone for fluid overload given poor mobility/TPN, will reinstate daily weights. -today's weight 178 from 205 11 days ago ?? real -osmolalities OK, no fluid overload on clinical exam.  -continue to monitor, Na stable, improving to 132 today  -TSH normal  MSSA HCAP -finished 7 days of cefepime, afebrile, stable respiratory status  Acute blood loss anemia -Baseline Hgb ~12 prior to admission, currently ~ 10. Stable -Continue PPI -There is some concerns for coffee-ground return in his NG tube therefore he is on SCDs and his Lovenox is on hold  R-adrenal mass/pheochromocytoma -s/p R-adrenalectomy, surgery was complicated by small bowel injury in the setting of adhesions with sepsis secondary to intra-abdominal infection -post-op care per surgery  Acute metabolic encephalopathy -multifactorial including sepsis, infection, electrolyte derangement, opioids/benzos.  Stable  Thrombocytosis -likely acute phase reactant and possible iron deficiency -pt had normal platelet count at time of admission  Essential Hypertension -remains stable off home clonidine, labetalol, chlorthalidone, valsartan  Chronic pain syndrome/Post-op pain -had significant post-op pain -remains on dilaudid PCA, po oxycodone scheduled, ativan scheduled -pain  management per CCS -previously on home gaba, percocet  Hyperlipidemia -remain off lipitor until able to tolerate po reliably  Tobacco abuse -nicoderm patch  Hyperglycemia -likely stress induced and TPN -A1C was 5.6, not a diabetic -continue novolog sliding scale    DVT prophylaxis: SCDs Code Status: Full code Family Communication: no family at bedside Disposition Plan: dispo per general surgery  Procedures:   As per progress note  2D echo Impressions: - Normal LV systolic function; mild proximal septal thickening; mild diastolic dysfunction.  Antimicrobials: Vancomycin 7/17 >>>7/21; 7/30 >>8/1 Eraxis 7/19 >>> 7/21, 7/26 >> 8/1 Cefazolin 7/31>>>8/3 Cefepime 7/17 >>>7/31 ; 8/3>>>8/8 Flagyl 7/20 >>> 7/30; 8/3>>> 8/8  Subjective: -pain controlled, no chest pain / dyspnea  Objective: Vitals:   06/09/17 0843 06/09/17 0900 06/09/17 1000 06/09/17 1100  BP:   116/70   Pulse:  (!) 108 (!) 107 (!) 107  Resp:  (!) 22 (!) 22 18  Temp:      TempSrc:      SpO2: 95% 96% 94% 95%  Weight:      Height:        Intake/Output Summary (Last 24 hours) at 06/09/17 1127 Last data filed at 06/09/17 1100  Gross per 24 hour  Intake          3146.86 ml  Output             1935 ml  Net          1211.86 ml   Danley Danker  Weights   06/07/17 0603 06/08/17 0500 06/09/17 0500  Weight: 78.1 kg (172 lb 2.9 oz) 79.2 kg (174 lb 9.7 oz) 79.5 kg (175 lb 4.3 oz)    Examination:  Vitals:   06/09/17 0843 06/09/17 0900 06/09/17 1000 06/09/17 1100  BP:   116/70   Pulse:  (!) 108 (!) 107 (!) 107  Resp:  (!) 22 (!) 22 18  Temp:      TempSrc:      SpO2: 95% 96% 94% 95%  Weight:      Height:       Constitutional: NAD, drowsy Eyes: No scleral icterus Respiratory: clear to auscultation bilaterally, no wheezing, no crackles. Normal respiratory effort.  Cardiovascular: Regular rate and rhythm, no murmurs / rubs / gallops. No LE edema. 2+ pedal pulses.  Abdomen: Drains in place, wound VAC in  place Skin: no rashes, lesions, ulcers. No induration Neurologic: non focal   Data Reviewed: I have independently reviewed following labs and imaging studies   CBC:  Recent Labs Lab 06/03/17 0723 06/04/17 0317 06/08/17 0525 06/09/17 0600  WBC 16.1* 13.2* 15.4* 13.8*  NEUTROABS 10.8* 8.4* 8.9*  --   HGB 9.7* 10.5* 9.3* 8.4*  HCT 29.0* 30.7* 28.0* 26.6*  MCV 79.0 79.5 79.5 79.4  PLT 889* 780* 635* 096*   Basic Metabolic Panel:  Recent Labs Lab 06/04/17 0317 06/05/17 0308 06/05/17 0334 06/06/17 1159 06/07/17 0658 06/08/17 0525 06/09/17 0600  NA 129* 129*  --  129* 129* 132* 136  K 4.9 4.5  --  4.5 4.4 4.4 3.9  CL 98* 97*  --  96* 93* 95* 94*  CO2 22 22  --  24 23 27 30   GLUCOSE 119* 135*  --  132* 127* 122* 150*  BUN 22* 26*  --  31* 35* 36* 32*  CREATININE 0.71 0.68  --  0.82 0.86 0.95 0.98  CALCIUM 9.3 9.3  --  9.7 9.7 9.5 9.3  MG 1.8  --  1.6* 1.9 1.9 1.7  --   PHOS 5.6*  --  4.9*  --  6.5* 5.8* 5.7*   GFR: Estimated Creatinine Clearance: 86.9 mL/min (by C-G formula based on SCr of 0.98 mg/dL). Liver Function Tests:  Recent Labs Lab 06/04/17 0317 06/05/17 0308 06/06/17 1159 06/08/17 0525  AST 35 31 35 32  ALT 28 25 31 27   ALKPHOS 257* 245* 259* 212*  BILITOT 0.7 0.8 1.0 0.8  PROT 7.7 7.7 8.4* 7.8  ALBUMIN 2.6* 2.6* 2.9* 2.8*   No results for input(s): LIPASE, AMYLASE in the last 168 hours. No results for input(s): AMMONIA in the last 168 hours. Coagulation Profile: No results for input(s): INR, PROTIME in the last 168 hours. Cardiac Enzymes: No results for input(s): CKTOTAL, CKMB, CKMBINDEX, TROPONINI in the last 168 hours. BNP (last 3 results) No results for input(s): PROBNP in the last 8760 hours. HbA1C: No results for input(s): HGBA1C in the last 72 hours. CBG:  Recent Labs Lab 06/07/17 2353 06/08/17 0602 06/08/17 1750 06/08/17 2340 06/09/17 0536  GLUCAP 134* 128* 135* 129* 123*   Lipid Profile: No results for input(s): CHOL, HDL,  LDLCALC, TRIG, CHOLHDL, LDLDIRECT in the last 72 hours. Thyroid Function Tests: No results for input(s): TSH, T4TOTAL, FREET4, T3FREE, THYROIDAB in the last 72 hours. Anemia Panel: No results for input(s): VITAMINB12, FOLATE, FERRITIN, TIBC, IRON, RETICCTPCT in the last 72 hours. Urine analysis:    Component Value Date/Time   COLORURINE AMBER (A) 05/22/2017 1300   APPEARANCEUR CLEAR 05/22/2017  1300   LABSPEC 1.021 05/22/2017 1300   PHURINE 6.0 05/22/2017 1300   GLUCOSEU NEGATIVE 05/22/2017 1300   HGBUR SMALL (A) 05/22/2017 1300   BILIRUBINUR NEGATIVE 05/22/2017 1300   KETONESUR NEGATIVE 05/22/2017 1300   PROTEINUR NEGATIVE 05/22/2017 1300   UROBILINOGEN 0.2 02/01/2014 0454   NITRITE NEGATIVE 05/22/2017 1300   LEUKOCYTESUR SMALL (A) 05/22/2017 1300   Sepsis Labs: Invalid input(s): PROCALCITONIN, LACTICIDVEN  Recent Results (from the past 240 hour(s))  Body fluid culture     Status: None   Collection Time: 05/30/17 12:16 PM  Result Value Ref Range Status   Specimen Description FLUID ASPIRATE  Final   Special Requests SMALL PERIHEPATIC FLUID  Final   Gram Stain   Final    FEW WBC PRESENT, PREDOMINANTLY PMN NO ORGANISMS SEEN    Culture   Final    NO GROWTH 3 DAYS Performed at [facility], [address] [place], [place] [address]    Report Status 06/04/2017 FINAL  Final      Radiology Studies: No results found.   Scheduled Meds: . chlorhexidine  15 mL Mouth Rinse BID  . Chlorhexidine Gluconate Cloth  6 each Topical Q1200  . HYDROmorphone   Intravenous Q4H  . insulin aspart  0-15 Units Subcutaneous Q6H  . iopamidol      . ipratropium-albuterol  3 mL Nebulization BID  . lip balm  1 application Topical BID  . LORazepam  2 mg Oral Q6H  . mouth rinse  15 mL Mouth Rinse q12n4p  . nicotine  21 mg Transdermal Daily  . oxyCODONE  10 mg Per Tube Q4H  . pantoprazole (PROTONIX) IV  40 mg Intravenous Q12H  . thiamine injection  100 mg Intravenous Daily   Continuous  Infusions: . chlorproMAZINE (THORAZINE) IV Stopped (06/09/17 0727)  . dextrose 5 % and 0.45% NaCl 10 mL/hr at 06/08/17 1200  . TPN (CLINIMIX) Adult without lytes     And  . fat emulsion    . piperacillin-tazobactam (ZOSYN)  IV Stopped (06/09/17 0834)  . TPN (CLINIMIX) Adult without lytes 83 mL/hr at 06/08/17 1856    Marzetta Board, MD, PhD [facility] Pager [phone]  If 7PM-7AM, please contact night-coverage [email or web address] Password [facility] 06/09/2017, 11:27 AM

## 2017-06-09 NOTE — Progress Notes (Signed)
[place] NOTE   Pharmacy Consult for TPN Indication: prolonged ileus, small bowel leak, multiple bowel surgeries  Patient Measurements: Body mass index is 25.15 kg/m. Filed Weights   06/07/17 0603 06/08/17 0500 06/09/17 0500  Weight: 172 lb 2.9 oz (78.1 kg) 174 lb 9.7 oz (79.2 kg) 175 lb 4.3 oz (79.5 kg)   HPI: 61 yoM admitted on 7/11 for enlarging adrenal mass and planned adrenalectomy.  Pharmacy consulted to dose TPN.  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   7/22 OR:  wash out, tube ileostomy, left with open abdomen & wound vac in place.  Plan for OR on 7/25 for closure 7/25 OR: mesh placed for abdominal closure, wound vac 8/2 extubated, propofol drip off 8/4 TPN off for ~ 5 hours due to line detached from filter 8/10 MD request decrease rate x 1 week 2nd low sodium 8/12 Additional sodium added to TPN due to persistent hyponatremia  Insulin requirements past 24 hours: 10 units SSI (no hx DM)  Current Nutrition: NPO, ice chips  IVF: D5 1/2NS at10 ml/hr  Central access: PICC line ordered 7/19, placed on 7/20 TPN start date: 7/20  ASSESSMENT                                                                                                           Today, 06/09/17  - Glucose: at goal (goal < 150) - Electrolytes:  Na now normal at 136 with the addition of sodium to TPN: now with a total of 154 meq Na/L - equivalent to NS, standard TPN has ~ 70 meq/L  Phos high 5.7, CoCa 10.26, CaxPhos = ~58.4  goal < 55   K 3.9, decreasing   Mag 1.7 yesterday - Renal:  SCr wnl,  stable. I/O: +733,   NGO 86 - Weight:79.5,  Was 93.3kg on 7/31 - GI: VAC removed, drains in - LFTs: WNL x alk phos 212.   Tbili WNL  - TGs:  296 (7/18), 266 (7/20), 269 (7/23), 210 (7/28), 285 (7/31) 301 (8/3), pending this AM  - Prealbumin:  <5 (7/20), 6.1 (7/24), 10.1 (7/30) 24.8 (8/6), 26.4 (8/13)  NUTRITIONAL GOALS                                                                                             RD recs (8/10):  122-138  gms Protein (1.5 - 1.7 g/kg) 2030 - 2270 Kcal  Clinimix  E 5/15 at a max fluid volume of 3L at 120 ml/hr  + 20% fat emulsion 240 ml/day to provide: 144 g of protein and 2568 kCals per day - Glucose infusion rate will be 3.2 mg/kg/min (Maximum 5 mg/kg/min)  Clinimix 5/20 (no electrolytes) at rate of 115 ml/hr with lipids 3x/week on MWF only would provide: 138g protein and avg 2174 kcal/day - Glucose infusion rate will be 4.11 mg/kg/min (Maximum 5 mg/kg/min)   PLAN                                                                                                                          8/10 Discussed with Dr. Kieth Brightly 8/10 - concerned about hyponatremia, possibly due to high-volume TPN. Requested to decrease rate for a short time (~1 week) and monitor sodium levels closely. Understands that this will decrease the amount of protein the patient will be receiving for the time being but prealbumin has improved and wound is healing.   At 1800 today: ? Continue no electrolyte formula due to elevated calcium-phosphorus product but continue to add sodium 154 meq/L and continue rate of 83 ml/hr (~2L volume per day) ? Lipids at 45ms/hr x 12 hours  ? This provides ~ 100 gm protein and 1894 Kcals  TPN to contain standard multivitamins and trace elements daily  Continue [place]F [place] moderate SSI q6h.  TPN lab panels on Mondays & Thursdays.  Check BMET and Phos in AM  EPeggyann Juba PharmD, BCPS Pager: [phone]/14/2018 7:02 AM

## 2017-06-10 LAB — COMPREHENSIVE METABOLIC PANEL
ALK PHOS: 174 U/L — AB (ref 38–126)
ALT: 21 U/L (ref 17–63)
ANION GAP: 9 (ref 5–15)
AST: 23 U/L (ref 15–41)
Albumin: 2.5 g/dL — ABNORMAL LOW (ref 3.5–5.0)
BILIRUBIN TOTAL: 0.6 mg/dL (ref 0.3–1.2)
BUN: 26 mg/dL — ABNORMAL HIGH (ref 6–20)
CALCIUM: 9.1 mg/dL (ref 8.9–10.3)
CO2: 32 mmol/L (ref 22–32)
Chloride: 97 mmol/L — ABNORMAL LOW (ref 101–111)
Creatinine, Ser: 0.95 mg/dL (ref 0.61–1.24)
Glucose, Bld: 121 mg/dL — ABNORMAL HIGH (ref 65–99)
Potassium: 3.5 mmol/L (ref 3.5–5.1)
SODIUM: 138 mmol/L (ref 135–145)
TOTAL PROTEIN: 7.1 g/dL (ref 6.5–8.1)

## 2017-06-10 LAB — CBC
HCT: 25.1 % — ABNORMAL LOW (ref 39.0–52.0)
HEMOGLOBIN: 8.1 g/dL — AB (ref 13.0–17.0)
MCH: 25.7 pg — ABNORMAL LOW (ref 26.0–34.0)
MCHC: 32.3 g/dL (ref 30.0–36.0)
MCV: 79.7 fL (ref 78.0–100.0)
Platelets: 529 10*3/uL — ABNORMAL HIGH (ref 150–400)
RBC: 3.15 MIL/uL — AB (ref 4.22–5.81)
RDW: 16.9 % — ABNORMAL HIGH (ref 11.5–15.5)
WBC: 13 10*3/uL — AB (ref 4.0–10.5)

## 2017-06-10 LAB — GLUCOSE, CAPILLARY
GLUCOSE-CAPILLARY: 125 mg/dL — AB (ref 65–99)
Glucose-Capillary: 116 mg/dL — ABNORMAL HIGH (ref 65–99)
Glucose-Capillary: 120 mg/dL — ABNORMAL HIGH (ref 65–99)
Glucose-Capillary: 123 mg/dL — ABNORMAL HIGH (ref 65–99)
Glucose-Capillary: 128 mg/dL — ABNORMAL HIGH (ref 65–99)

## 2017-06-10 LAB — PHOSPHORUS: Phosphorus: 4.9 mg/dL — ABNORMAL HIGH (ref 2.5–4.6)

## 2017-06-10 LAB — MAGNESIUM: Magnesium: 1.6 mg/dL — ABNORMAL LOW (ref 1.7–2.4)

## 2017-06-10 MED ORDER — TRACE MINERALS CR-CU-MN-SE-ZN 10-1000-500-60 MCG/ML IV SOLN
INTRAVENOUS | Status: AC
  Administered 2017-06-10: 17:00:00 via INTRAVENOUS
  Filled 2017-06-10 (×2): qty 1992

## 2017-06-10 MED ORDER — MAGNESIUM SULFATE 2 GM/50ML IV SOLN
2.0000 g | Freq: Once | INTRAVENOUS | Status: AC
  Administered 2017-06-10: 2 g via INTRAVENOUS
  Filled 2017-06-10: qty 50

## 2017-06-10 MED ORDER — FAT EMULSION 20 % IV EMUL
240.0000 mL | INTRAVENOUS | Status: AC
  Administered 2017-06-10: 240 mL via INTRAVENOUS
  Filled 2017-06-10: qty 250

## 2017-06-10 NOTE — Progress Notes (Signed)
[place] NOTE   Pharmacy Consult for TPN Indication: prolonged ileus, small bowel leak, multiple bowel surgeries  Patient Measurements: Body mass index is 24.2 kg/m. Filed Weights   06/08/17 0500 06/09/17 0500 06/10/17 0600  Weight: 174 lb 9.7 oz (79.2 kg) 175 lb 4.3 oz (79.5 kg) 168 lb 10.4 oz (76.5 kg)   HPI: 29 yoM admitted on 7/11 for enlarging adrenal mass and planned adrenalectomy.  Pharmacy consulted to dose TPN.  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   7/22 OR:  wash out, tube ileostomy, left with open abdomen & wound vac in place.  Plan for OR on 7/25 for closure 7/25 OR: mesh placed for abdominal closure, wound vac 8/2 extubated, propofol drip off 8/4 TPN off for ~ 5 hours due to line detached from filter 8/10 MD request decrease rate x 1 week 2nd low sodium 8/12 Additional sodium added to TPN due to persistent hyponatremia  Insulin requirements past 24 hours: 4 units SSI (no hx DM)  Current Nutrition: NPO, ice chips  IVF: D5 1/2NS at10 ml/hr  Central access: PICC line ordered 7/19, placed on 7/20 TPN start date: 7/20  ASSESSMENT                                                                                                           Today, 06/10/17  - Glucose: at goal (goal < 150) - Electrolytes:  Na now normal at 138 with the addition of sodium to TPN: now with a total of 154 meq Na/L - equivalent to NS, standard TPN has ~ 70 meq/L  Phos high 4.9, CoCa 10.3, CaxPhos = ~50.47  goal < 55   K 3.5, decreasing   Mag 1.6  - Renal:  SCr wnl, stable. I/O:  +1042,   NGO 400 - Weight:76.5,  Was 93.3kg on 7/31 - GI: VAC removed, drains in - LFTs: WNL x alk phos 174.   Tbili WNL  - TGs:  296 (7/18), 266 (7/20), 269 (7/23), 210 (7/28), 285 (7/31) 301 (8/3), increased 323 on 814  - Prealbumin:  <5 (7/20), 6.1 (7/24), 10.1 (7/30) 24.8 (8/6), now normal 26.4 (8/13)  NUTRITIONAL GOALS                                                                                             RD recs (  8/10):  122-138 gms Protein (1.5 - 1.7 g/kg) 2030 - 2270 Kcal  Clinimix  E 5/15 at a max fluid volume of 3L at 120 ml/hr  + 20% fat emulsion 240 ml/day to provide: 144 g of protein and 2568 kCals per day - Glucose infusion rate will be 3.2 mg/kg/min (Maximum 5 mg/kg/min)  Clinimix 5/20 (no electrolytes) at rate of 115 ml/hr with lipids 3x/week on MWF only would provide: 138g protein and avg 2174 kcal/day - Glucose infusion rate will be 4.11 mg/kg/min (Maximum 5 mg/kg/min)   PLAN                                                                                                                          8/10 Discussed with Dr. Kieth Brightly 8/10 - concerned about hyponatremia, possibly due to high-volume TPN. Requested to decrease rate for a short time (~1 week) and monitor sodium levels closely. Understands that this will decrease the amount of protein the patient will be receiving for the time being but prealbumin has improved and wound is healing.   Now: Magnesium sulfate 2g IV x 1 per MD order  At 1800 today: ? Continue no electrolyte formula due to elevated calcium-phosphorus product but continue to add sodium 154 meq/L and continue rate of 83 ml/hr (~2L volume per day).  ? This provides ~ 100 gm protein and 1894 Kcals - plan to discuss with surgeon tomorrow regarding the ability to increase TPN rate in order to provide goal protein and kcal needs as per above discussion on 8/10. ? Lipids at 88ms/hr x 12 hours - monitor triglycerides and consider administering 3 times  weekly  TPN to contain standard multivitamins and trace elements daily  [place] S[place] q6h.  TPN lab panels on Mondays & Thursdays.  Check BMET and Phos in AM  EPeggyann Juba PharmD, BCPS Pager: [phone]/15/2018 8:03 AM

## 2017-06-10 NOTE — Progress Notes (Signed)
Multiple attempts by nursing staff to assist patient out of bed to chair today. Patient insists he needs to rest today and does not want to get up. Patient educated about importance of getting out of bed. Also, refused to get out of bed with OT earlier in the day.

## 2017-06-10 NOTE — Progress Notes (Signed)
OT Cancellation Note  Patient Details Name: [name] [name] MRN: [id number] DOB: Feb 29, 1961   Cancelled Treatment:    Reason Eval/Treat Not Completed: Fatigue/lethargy limiting ability to participate.  Pt reports he just got back to bed. Will reattempt tomorrow if schedule permits.  [name] 06/10/2017, 1:12 PM  [name], OTR/L [phone] 06/10/2017

## 2017-06-10 NOTE — Progress Notes (Signed)
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 Patient's course was complicated by small bowel injury requiring resection with anastomoses and placement of wound VAC. On 7/18, patient developed leak and was taken back to the operating room for redo of his exploratory laparotomy and repair of leak. That time, patient returned to the ICU on the ventilator. Critical care was consulted for ventilator/medical management.  Over the next 2 weeks, patient's hospital course was complicated with the following: 07/19 - NGT pulled out and almost self-extubated. Developed cuff leak >> tube exchanged. Back to OR emergently for intestinal leak/ischemic bowel.TPN started at that time 07/22 - Back to OR for wash out >> left with open abdomen & wound vac in place. Transfused 1u PRBC. 07/25 - rising temp, WBC increased. Returned to OR for closure 07/27 - Recultured, sedation down on precedex [facility] 7/27 >> MRSE 1/2 > contaminant ? 07/30 - Remains on sedation, VAC leak over the weekend with dressing change by [facility] 07/31 - Initial attempt at weaning failed to due to tachypnea/anxiety 08/02 - Fever 101.1, WBC 14.2, weaned / extubated 8/3 - Extubated for 1 day, tolerating well. O2 weaned to 1L. Remains on dilaudid, initially on gtt now PCA. RN reports precedex started overnight for anxiety. 8/4: Ultrasound-guided aspiration By interventional radiology of small perihepatic fluid collection yielded only 1 mL of the fluid, sent for culture. No growth yet 8/6, patient improving. Off Precedex drip  since 8/4. White blood cell count staying elevated at 14-16 for past 10 days. Hemoglobin Stable for the past 2 weeks between 7-9. Still on TPN 8/8: wound vac changed per surgery. WBC 16. Abx d/c per surgery  8/13: Patient with increased tachycardia and developed a fever.  Wound VAC noted with feculent material.  Started on antibiotics   Assessment & Plan: Principal Problem:   Right adrenal mass s/p adrenalectomy 05/06/2017 Active Problems:   GERD   Tobacco abuse   COPD (chronic obstructive pulmonary disease) ([place])   Essential hypertension   Hyperthyroidism   H/O total adrenalectomy ([place])   Intra-abdominal adhesions s/p SB resection 05/06/2017   Chronic narcotic use   Anxiety state   Hypokalemia   Anastomotic leak of intestine   Severe sepsis (HCC)   Wound dehiscence, surgical   Aspiration pneumonia (HCC)   Acute respiratory failure with hypoxemia (HCC)   Ileus (HCC)   Mucus plugging of bronchi   Pressure injury of skin   Peritonitis ([place])   Sepsis -due to peritonitis and MSSA HCAP.  Patient has been on and off antibiotics for these as outlined below.  His white count has been overall stable in the last couple of weeks, and antibiotics were discontinued by general surgery on 8/8.  Zosyn had to be restarted however on 8/13 due to concern for recurrent sepsis given fever and elevated heart rate. Heart rate as well as leukocytosis getting better. Patient primarily have leaking bowel, will defer to surgery for further management. At present we'll continue IV antibiotics for a total of 7-10 days.  Peritonitis/intestinal perforation/anastomotic leak -05/29/2017 CT abdomen and pelvis thick walled fluid  collection along anterior and lateral aspects of the liver; extends 20 cm along anterior and lateral margins of liver.  Interventional radiology was consulted and on 05/30/2017 he underwent aspiration.  Cultures have remained negative. -05/20/17--ABDOMINAL WOUND [place] OUT, WOUND CLOSURE AND WOUND  VAC PLACEMENT.  Wound vacs changed 8/8 -NG remains in place, patient will need to be on TPN for quite some time per general surgery -His antibiotics were discontinued on 8/8, he was monitor off of all antibiotics up until 8/13 when he developed a fever again.  He is likely developing peritonitis in the setting of stool leakage. Repeat CT scan of the abdomen and pelvis shows fecal material EKG. Awaiting recommendation from surgery or further management.  Acute respiratory failure with hypoxia -Secondary to sepsis and HCAP post op adrenalectomy.  Currently he is stable on nasal cannula.  He was intubated and has been extubated since 05/28/2017.  Continue DuoNeb's. -Wean off oxygen as tolerated  Sinus tachycardia -ongoing, likely related to peritonitis -2D echo obtained 8/13 showed an EF of 55-60% and grade 1 diastolic dysfunction  Hyponatremia -Suspect due to TPN, also need to be very careful regarding volume status as he can be prone for fluid overload given poor mobility/TPN, will reinstate daily weights. -today's weight 178 from 205 11 days ago ?? real -osmolalities OK, no fluid overload on clinical exam.  -continue to monitor, Na stable, improving to 132 today  -TSH normal  MSSA HCAP -finished 7 days of cefepime, afebrile, stable respiratory status  Acute blood loss anemia -Baseline Hgb ~12 prior to admission, currently ~ 10. Stable -Continue PPI -There is some concerns for coffee-ground return in his NG tube therefore he is on SCDs and his Lovenox is on hold  R-adrenal mass/pheochromocytoma -s/p R-adrenalectomy, surgery was complicated by small bowel injury in the setting of adhesions with sepsis secondary to intra-abdominal infection -post-op care per surgery  Acute metabolic encephalopathy -multifactorial including sepsis, infection, electrolyte derangement, opioids/benzos.  Stable  Thrombocytosis -likely acute phase reactant and possible iron deficiency -pt had normal platelet  count at time of admission  Essential Hypertension -remains stable off home clonidine, labetalol, chlorthalidone, valsartan  Chronic pain syndrome/Post-op pain -had significant post-op pain -remains on dilaudid PCA, po oxycodone scheduled, ativan scheduled -pain management per CCS -previously on home gaba, percocet  Hyperlipidemia -remain off lipitor until able to tolerate po reliably  Tobacco abuse -nicoderm patch  Hyperglycemia -likely stress induced and TPN -A1C was 5.6, not a diabetic -continue novolog sliding scale    DVT prophylaxis: SCDs Code Status: Full code Family Communication: no family at bedside Disposition Plan: dispo per general surgery  Procedures:   As per progress note  2D echo Impressions: - Normal LV systolic function; mild proximal septal thickening; mild diastolic dysfunction.  Antimicrobials: Vancomycin 7/17 >>>7/21; 7/30 >>8/1 Eraxis 7/19 >>> 7/21, 7/26 >> 8/1 Cefazolin 7/31>>>8/3 Cefepime 7/17 >>>7/31 ; 8/3>>>8/8 Flagyl 7/20 >>> 7/30; 8/3>>> 8/8  Subjective: Pain remains well controlled. No nausea no vomiting. No abdominal pain.  Objective: Vitals:   06/10/17 1500 06/10/17 1600 06/10/17 1625 06/10/17 1700  BP:  108/71    Pulse: (!) 108 (!) 108  (!) 109  Resp: 20 (!) 21 (!) 22 (!) 22  Temp:  98.3 F (36.8 C)    TempSrc:  Oral    SpO2: 95% 95% 97% 94%  Weight:      Height:        Intake/Output Summary (Last 24 hours) at 06/10/17 1727 Last data filed at 06/10/17 1718  Gross per 24 hour  Intake          3043.92 ml  Output             1630 ml  Net          1413.92 ml   Filed Weights   06/08/17 0500 06/09/17 0500 06/10/17 0600  Weight: 79.2 kg (174 lb 9.7 oz) 79.5 kg (175 lb 4.3 oz) 76.5 kg (168 lb 10.4 oz)    Examination:  Vitals:   06/10/17 1500 06/10/17 1600 06/10/17 1625 06/10/17 1700  BP:  108/71    Pulse: (!) 108 (!) 108  (!) 109  Resp: 20 (!) 21 (!) 22 (!) 22  Temp:  98.3 F (36.8 C)    TempSrc:  Oral     SpO2: 95% 95% 97% 94%  Weight:      Height:       Constitutional: NAD, drowsy Eyes: No scleral icterus Respiratory: clear to auscultation bilaterally, no wheezing, no crackles. Normal respiratory effort.  Cardiovascular: Regular rate and rhythm, no murmurs / rubs / gallops. No LE edema. 2+ pedal pulses.  Abdomen: Drains in place, wound VAC in place Skin: no rashes, lesions, ulcers. No induration Neurologic: non focal   Data Reviewed: I have independently reviewed following labs and imaging studies   CBC:  Recent Labs Lab 06/04/17 0317 06/08/17 0525 06/09/17 0600 06/10/17 0603  WBC 13.2* 15.4* 13.8* 13.0*  NEUTROABS 8.4* 8.9*  --   --   HGB 10.5* 9.3* 8.4* 8.1*  HCT 30.7* 28.0* 26.6* 25.1*  MCV 79.5 79.5 79.4 79.7  PLT 780* 635* 560* 016*   Basic Metabolic Panel:  Recent Labs Lab 06/05/17 0334 06/06/17 1159 06/07/17 0658 06/08/17 0525 06/09/17 0600 06/10/17 0603 06/10/17 0616  NA  --  129* 129* 132* 136 138  --   K  --  4.5 4.4 4.4 3.9 3.5  --   CL  --  96* 93* 95* 94* 97*  --   CO2  --  24 23 27 30  32  --   GLUCOSE  --  132* 127* 122* 150* 121*  --   BUN  --  31* 35* 36* 32* 26*  --   CREATININE  --  0.82 0.86 0.95 0.98 0.95  --   CALCIUM  --  9.7 9.7 9.5 9.3 9.1  --   MG 1.6* 1.9 1.9 1.7  --   --  1.6*  PHOS 4.9*  --  6.5* 5.8* 5.7* 4.9*  --    GFR: Estimated Creatinine Clearance: 89.6 mL/min (by C-G formula based on SCr of 0.95 mg/dL). Liver Function Tests:  Recent Labs Lab 06/04/17 0317 06/05/17 0308 06/06/17 1159 06/08/17 0525 06/10/17 0603  AST 35 31 35 32 23  ALT 28 25 31 27 21   ALKPHOS 257* 245* 259* 212* 174*  BILITOT 0.7 0.8 1.0 0.8 0.6  PROT 7.7 7.7 8.4* 7.8 7.1  ALBUMIN 2.6* 2.6* 2.9* 2.8* 2.5*   No results for input(s): LIPASE, AMYLASE in the last 168 hours. No results for input(s): AMMONIA in the last 168 hours. Coagulation Profile: No results for input(s): INR, PROTIME in the last 168 hours. Cardiac Enzymes: No results for  input(s): CKTOTAL, CKMB, CKMBINDEX, TROPONINI in the last 168 hours. BNP (last 3 results) No results for input(s): PROBNP in the last 8760 hours. HbA1C: No results for input(s): HGBA1C in the last 72 hours. CBG:  Recent Labs Lab 06/09/17 2326 06/10/17 0603 06/10/17 0756 06/10/17 1154 06/10/17 1601  GLUCAP 115* 120* 125* 128* 123*   Lipid Profile:  Recent Labs  06/09/17 0600  TRIG 323*   Thyroid Function Tests: No results for input(s): TSH, T4TOTAL, FREET4, T3FREE, THYROIDAB in the last 72 hours. Anemia Panel: No results for input(s): VITAMINB12, FOLATE, FERRITIN, TIBC, IRON, RETICCTPCT in the last 72 hours. Urine analysis:    Component Value Date/Time   COLORURINE AMBER (A) 05/22/2017 1300   APPEARANCEUR CLEAR 05/22/2017 1300   LABSPEC 1.021 05/22/2017 1300   PHURINE 6.0 05/22/2017 1300   GLUCOSEU NEGATIVE 05/22/2017 1300   HGBUR SMALL (A) 05/22/2017 1300   BILIRUBINUR NEGATIVE 05/22/2017 1300   KETONESUR NEGATIVE 05/22/2017 1300   PROTEINUR NEGATIVE 05/22/2017 1300   UROBILINOGEN 0.2 02/01/2014 0454   NITRITE NEGATIVE 05/22/2017 1300   LEUKOCYTESUR SMALL (A) 05/22/2017 1300   Sepsis Labs: Invalid input(s): PROCALCITONIN, LACTICIDVEN  No results found for this or any previous visit (from the past 240 hour(s)).    Radiology Studies: Ct Abdomen Pelvis W Contrast  Result Date: 06/09/2017 CLINICAL DATA:  Intestinal fistula. Post laparotomy for abdominal wound wash out and wound VAC placement. EXAM: CT ABDOMEN AND PELVIS WITH CONTRAST TECHNIQUE: Multidetector CT imaging of the abdomen and pelvis was performed using the standard protocol following bolus administration of intravenous contrast. CONTRAST:  100 cc Isovue-300 IV. COMPARISON:  Most recent CT 05/29/2017 FINDINGS: Lower chest: Tree in bud opacities in the right lower lobe. Dependent atelectasis in both lower lobes. Trace right pleural effusion. Hepatobiliary: Crescentic perihepatic fluid collection about the  anterior and lateral right lobe of the liver has apparent continuity with an anterior abdominal wound defect. Superiorly this collection measures approximately 12 mm in thickness with enhancing thick wall. Inferiorly there are air bubbles in continuity with the abdominal wall defect. Trace free fluid tracks inferiorly. There is no adjacent surgical clip. No intrahepatic fluid collection. Gallbladder physiologically distended, no calcified stone. No biliary dilatation. Pancreas: No ductal dilatation or inflammation. Spleen: Normal in size without focal abnormality. Adrenals/Urinary Tract: Post right adrenalectomy. The small fluid collection about the adrenalectomy bed is now ill-defined measuring approximately 2.1 x 1.6 cm, continued decrease in size from prior exam previously 4.0 x 2.2 cm. Left adrenal gland is normal. No hydronephrosis. Homogeneous renal enhancement with symmetric excretion on delayed phase imaging. Urinary bladder is physiologically distended. No bladder wall thickening. Stomach/Bowel: There is an enteric tube in place with tip in the stomach. Fluid distends the included distal esophagus. Smaller fluid level noted in the stomach. Surgical drain with skin entry site in the right lateral abdomen, loops in the central abdomen with tip in the midline in the upper abdomen. Two midline catheters, catheter with lower skin entry site appear is tibia ileostomy with intraluminal balloon in the mid abdomen. The more superior catheter tip terminates in the region of enteric sutures in the midline. Multiple enteric sutures post partial colectomy and small bowel resection in the right abdomen. Enteric anatomy is difficult to define, however there is question of extraluminal stool adjacent to enteric sutures in the right lower quadrant, images 37 through 42 series 2. This approximates the open anterior abdominal wound with wound VAC in place. High-density material in the region of multiple enteric sutures,  difficult to differentiate extraluminal enteric contrast versus surgical material, as this appears to track into the anterior abdominal wall (for example images 46 through 49), with more superior extension of the hyperdense material in the midline upper abdomen abdominal wall, postsurgical material is favored. No small or large bowel dilatation.  Formed stool in the transverse, descending and rectosigmoid colon. Vascular/Lymphatic: Multiple small retroperitoneal nodes. Mild aortic atherosclerosis. Reproductive: Prostate is unremarkable. Other: No ascites. Wound VAC overlies the right lateral abdominal wall. Musculoskeletal: Unchanged from prior exam.  No acute abnormality. IMPRESSION: 1. Complex enteric anatomy post bowel resections and multiple enteric sutures in the right abdomen. Question of extraluminal stool in the anterior right lateral abdomen in the region of multiple enteric sutures subjacent to right lateral abdominal wound VAC. Recent clinical note demonstrated concern for stool leakage, this would be congruent. 2. Crescentic perihepatic fluid collection with inferior margin in apparent continuity with the abdominal wall defect/wound VAC containing small foci of air. Overall size is similar to prior CT. 3. Distal esophagus distended with intraluminal fluid despite enteric tube in place. Tree in bud opacities in the right lower lobe are suspicious for aspiration in this setting. 4. Continued decreased size in the fluid collection of the right adrenalectomy bed, currently 2.1 x 1.6 cm. These results will be called to the ordering clinician or representative by the Radiologist Assistant, and communication documented in the PACS or zVision Dashboard. Electronically Signed   By: Jeb Levering M.D.   On: 06/09/2017 16:44     Scheduled Meds: . chlorhexidine  15 mL Mouth Rinse BID  . Chlorhexidine Gluconate Cloth  6 each Topical Q1200  . HYDROmorphone   Intravenous Q4H  . insulin aspart  0-15 Units  Subcutaneous Q6H  . ipratropium-albuterol  3 mL Nebulization BID  . lip balm  1 application Topical BID  . LORazepam  2 mg Oral Q6H  . mouth rinse  15 mL Mouth Rinse q12n4p  . nicotine  21 mg Transdermal Daily  . oxyCODONE  10 mg Per Tube Q4H  . pantoprazole (PROTONIX) IV  40 mg Intravenous Q12H  . thiamine injection  100 mg Intravenous Daily   Continuous Infusions: . chlorproMAZINE (THORAZINE) IV Stopped (06/09/17 1649)  . dextrose 5 % and 0.45% NaCl 10 mL/hr at 06/08/17 1200  . TPN (CLINIMIX) Adult without lytes 83 mL/hr at 06/10/17 1717   And  . fat emulsion 240 mL (06/10/17 1717)  . piperacillin-tazobactam (ZOSYN)  IV Stopped (06/10/17 1600)  . TPN [facility]) Adult without lytes Stopped (06/10/17 1718)    Author:  Berle Mull, MD [facility] Pager: [phone] 06/10/2017 5:29 PM     If 7PM-7AM, please contact night-coverage [email or web address] Password [facility] 06/10/2017, 5:27 PM

## 2017-06-10 NOTE — Progress Notes (Signed)
Progress Note: General Surgery Service   Assessment/Plan: Patient Active Problem List   Diagnosis Date Noted  . Peritonitis ([place]) 06/01/2017  . Pressure injury of skin 05/29/2017  . Mucus plugging of bronchi   . Acute respiratory failure with hypoxemia ([place])   . Ileus ([place])   . Hypokalemia 05/14/2017  . Anastomotic leak of intestine 05/14/2017  . Severe sepsis ([place]) 05/14/2017  . Wound dehiscence, surgical 05/14/2017  . Aspiration pneumonia ([place]) 05/14/2017  . Chronic narcotic use 05/10/2017  . Anxiety state 05/10/2017  . Intra-abdominal adhesions s/p SB resection 05/06/2017 05/09/2017  . H/O total adrenalectomy ([place]) 05/06/2017  . Right adrenal mass s/p adrenalectomy 05/06/2017 05/06/2017  . Throat symptom 03/18/2016  . Multinodular goiter 01/19/2016  . Hyperthyroidism 01/19/2016  . Essential hypertension   . Diarrhea 11/30/2014  . Anemia, iron deficiency 11/01/2014  . Leukocytosis 11/03/2013  . Tobacco abuse 07/26/2013  . COPD (chronic obstructive pulmonary disease) ([place]) 07/26/2013  . Left knee pain 07/26/2013  . Pain in joint, shoulder region 05/03/2013  . GERD 07/24/2008  . 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   s/p Procedure(s): ABDOMINAL WOUND [place] OUT, WOUND CLOSURE AND WOUND VAC PLACEMENT 05/20/2017 Continue iv abx Continue TPN Vac change tomorrow    LOS: 35 days  Chief Complaint/Subjective: No issues overnight, family requesting transfer of care to new physician  Objective: Vital signs in last 24 hours: Temp:  [97.3 F (36.3 C)-98.3 F (36.8 C)] 97.8 F (36.6 C) (08/15 0800) Pulse Rate:  [102-124] 106 (08/15 1000) Resp:  [17-28] 22 (08/15 1000) BP: (101-128)/(61-83) 101/75 (08/15 1000) SpO2:  [92 %-98 %] 93 % (08/15 1000) Weight:  [76.5 kg (168 lb 10.4 oz)] 76.5 kg (168 lb 10.4 oz) (08/15 0600) Last BM Date: 06/07/17 (has an ileostomy)  Intake/Output from previous day: 08/14 0701 - 08/15 0700 In: 3379 [I.V.:2369; NG/GT:575; IV  Piggyback:375] Out: 2295 [Urine:1250; Emesis/NG output:400; Drains:645] Intake/Output this shift: Total I/O In: 392 [I.V.:262; Other:20; NG/GT:60; IV Piggyback:50] Out: -   Lungs: CTAB  Cardiovascular: tachycardic  Abd: vac in place functional, drains in place with small amount of dark drainage, blake with small gray/tan drainage  Extremities: no edema  Neuro: AOx4  Lab Results: CBC   Recent Labs  06/09/17 0600 06/10/17 0603  WBC 13.8* 13.0*  HGB 8.4* 8.1*  HCT 26.6* 25.1*  PLT 560* 529*   BMET  Recent Labs  06/09/17 0600 06/10/17 0603  NA 136 138  K 3.9 3.5  CL 94* 97*  CO2 30 32  GLUCOSE 150* 121*  BUN 32* 26*  CREATININE 0.98 0.95  CALCIUM 9.3 9.1   PT/INR No results for input(s): LABPROT, INR in the last 72 hours. ABG No results for input(s): PHART, HCO3 in the last 72 hours.  Invalid input(s): PCO2, PO2  Studies/Results:  Anti-infectives: Anti-infectives    Start     Dose/Rate Route Frequency Ordered Stop   06/08/17 1200  piperacillin-tazobactam (ZOSYN) IVPB 3.375 g     3.375 g 12.5 mL/hr over 240 Minutes Intravenous Every 8 hours 06/08/17 1056     05/29/17 2200  ceFEPIme (MAXIPIME) 2 g in dextrose 5 % 50 mL IVPB  Status:  Discontinued     2 g 100 mL/hr over 30 Minutes Intravenous Every 8 hours 05/29/17 2031 06/03/17 0832   05/29/17 2100  metroNIDAZOLE (FLAGYL) IVPB 500 mg  Status:  Discontinued     500 mg 100 mL/hr over 60 Minutes Intravenous Every 8 hours 05/29/17 2029 06/03/17 0832   05/26/17  2200  ceFAZolin (ANCEF) IVPB 1 g/50 mL premix  Status:  Discontinued     1 g 100 mL/hr over 30 Minutes Intravenous Every 8 hours 05/26/17 1008 05/29/17 2023   05/25/17 1100  vancomycin (VANCOCIN) 1,250 mg in sodium chloride 0.9 % 250 mL IVPB  Status:  Discontinued     1,250 mg 166.7 mL/hr over 90 Minutes Intravenous Every 12 hours 05/25/17 0955 05/26/17 0946   05/22/17 1400  anidulafungin (ERAXIS) 100 mg in sodium chloride 0.9 % 100 mL IVPB     100  mg 78 mL/hr over 100 Minutes Intravenous Every 24 hours 05/21/17 1330 05/27/17 1740   05/21/17 1400  anidulafungin (ERAXIS) 200 mg in sodium chloride 0.9 % 200 mL IVPB     200 mg 78 mL/hr over 200 Minutes Intravenous  Once 05/21/17 1330 05/21/17 1940   05/15/17 1000  anidulafungin (ERAXIS) 100 mg in sodium chloride 0.9 % 100 mL IVPB  Status:  Discontinued     100 mg 78 mL/hr over 100 Minutes Intravenous Every 24 hours 05/14/17 0829 05/16/17 0901   05/15/17 1000  metroNIDAZOLE (FLAGYL) IVPB 500 mg  Status:  Discontinued     500 mg 100 mL/hr over 60 Minutes Intravenous Every 8 hours 05/15/17 0903 05/25/17 0938   05/14/17 0900  anidulafungin (ERAXIS) 200 mg in sodium chloride 0.9 % 200 mL IVPB     200 mg 78 mL/hr over 200 Minutes Intravenous  Once 05/14/17 0829 05/14/17 1325   05/13/17 1927  vancomycin (VANCOCIN) 1-5 GM/200ML-% IVPB    Comments:  Ward, Christa   : cabinet override      05/13/17 1927 05/14/17 0744   05/12/17 1400  ceFEPIme (MAXIPIME) 1 g in dextrose 5 % 50 mL IVPB     1 g 100 mL/hr over 30 Minutes Intravenous Every 8 hours 05/12/17 0939 05/26/17 2359   05/12/17 0900  vancomycin (VANCOCIN) IVPB 1000 mg/200 mL premix  Status:  Discontinued     1,000 mg 200 mL/hr over 60 Minutes Intravenous Every 12 hours 05/12/17 0750 05/16/17 0852   05/12/17 0830  aztreonam (AZACTAM) 2 GM IVPB     2 g 100 mL/hr over 30 Minutes Intravenous  Once 05/12/17 0800 05/12/17 0957   05/06/17 0916  vancomycin (VANCOCIN) IVPB 1000 mg/200 mL premix     1,000 mg 200 mL/hr over 60 Minutes Intravenous On call to O.R. 05/06/17 0916 05/06/17 1229      Medications: Scheduled Meds: . chlorhexidine  15 mL Mouth Rinse BID  . Chlorhexidine Gluconate Cloth  6 each Topical Q1200  . HYDROmorphone   Intravenous Q4H  . insulin aspart  0-15 Units Subcutaneous Q6H  . ipratropium-albuterol  3 mL Nebulization BID  . lip balm  1 application Topical BID  . LORazepam  2 mg Oral Q6H  . mouth rinse  15 mL Mouth  Rinse q12n4p  . nicotine  21 mg Transdermal Daily  . oxyCODONE  10 mg Per Tube Q4H  . pantoprazole (PROTONIX) IV  40 mg Intravenous Q12H  . thiamine injection  100 mg Intravenous Daily   Continuous Infusions: . chlorproMAZINE (THORAZINE) IV Stopped (06/09/17 1649)  . dextrose 5 % and 0.45% NaCl 10 mL/hr at 06/08/17 1200  . TPN (CLINIMIX) Adult without lytes     And  . fat emulsion    . piperacillin-tazobactam (ZOSYN)  IV Stopped (06/10/17 0904)  . TPN (CLINIMIX) Adult without lytes 83 mL/hr at 06/09/17 1809   PRN Meds:.chlorproMAZINE (THORAZINE) IV, diphenhydrAMINE, HYDROmorphone, iopamidol, labetalol,  LORazepam, magic mouthwash, menthol-cetylpyridinium, naloxone **AND** sodium chloride flush, ondansetron **OR** ondansetron (ZOFRAN) IV, phenol, sodium chloride flush  Mickeal Skinner, MD Pg# [phone] [facility]

## 2017-06-11 LAB — COMPREHENSIVE METABOLIC PANEL
ALT: 21 U/L (ref 17–63)
ANION GAP: 7 (ref 5–15)
AST: 27 U/L (ref 15–41)
Albumin: 2.4 g/dL — ABNORMAL LOW (ref 3.5–5.0)
Alkaline Phosphatase: 165 U/L — ABNORMAL HIGH (ref 38–126)
BILIRUBIN TOTAL: 0.6 mg/dL (ref 0.3–1.2)
BUN: 24 mg/dL — ABNORMAL HIGH (ref 6–20)
CHLORIDE: 101 mmol/L (ref 101–111)
CO2: 34 mmol/L — ABNORMAL HIGH (ref 22–32)
Calcium: 9.1 mg/dL (ref 8.9–10.3)
Creatinine, Ser: 0.87 mg/dL (ref 0.61–1.24)
Glucose, Bld: 110 mg/dL — ABNORMAL HIGH (ref 65–99)
Potassium: 3.4 mmol/L — ABNORMAL LOW (ref 3.5–5.1)
Sodium: 142 mmol/L (ref 135–145)
TOTAL PROTEIN: 7 g/dL (ref 6.5–8.1)

## 2017-06-11 LAB — CBC WITH DIFFERENTIAL/PLATELET
BASOS ABS: 0 10*3/uL (ref 0.0–0.1)
BASOS PCT: 0 %
Eosinophils Absolute: 0.5 10*3/uL (ref 0.0–0.7)
Eosinophils Relative: 4 %
HEMATOCRIT: 24.2 % — AB (ref 39.0–52.0)
Hemoglobin: 7.7 g/dL — ABNORMAL LOW (ref 13.0–17.0)
LYMPHS PCT: 32 %
Lymphs Abs: 3.8 10*3/uL (ref 0.7–4.0)
MCH: 25.8 pg — ABNORMAL LOW (ref 26.0–34.0)
MCHC: 31.8 g/dL (ref 30.0–36.0)
MCV: 80.9 fL (ref 78.0–100.0)
MONO ABS: 0.7 10*3/uL (ref 0.1–1.0)
Monocytes Relative: 6 %
NEUTROS ABS: 7.1 10*3/uL (ref 1.7–7.7)
NEUTROS PCT: 58 %
Platelets: 503 10*3/uL — ABNORMAL HIGH (ref 150–400)
RBC: 2.99 MIL/uL — AB (ref 4.22–5.81)
RDW: 17 % — AB (ref 11.5–15.5)
WBC: 12.2 10*3/uL — AB (ref 4.0–10.5)

## 2017-06-11 LAB — GLUCOSE, CAPILLARY
GLUCOSE-CAPILLARY: 80 mg/dL (ref 65–99)
GLUCOSE-CAPILLARY: 121 mg/dL — AB (ref 65–99)
GLUCOSE-CAPILLARY: 131 mg/dL — AB (ref 65–99)
Glucose-Capillary: 120 mg/dL — ABNORMAL HIGH (ref 65–99)

## 2017-06-11 LAB — HEMOGLOBIN AND HEMATOCRIT, BLOOD
HEMATOCRIT: 27 % — AB (ref 39.0–52.0)
Hemoglobin: 8.8 g/dL — ABNORMAL LOW (ref 13.0–17.0)

## 2017-06-11 LAB — MAGNESIUM: MAGNESIUM: 1.8 mg/dL (ref 1.7–2.4)

## 2017-06-11 LAB — MRSA PCR SCREENING: MRSA by PCR: NEGATIVE

## 2017-06-11 LAB — PHOSPHORUS: PHOSPHORUS: 5 mg/dL — AB (ref 2.5–4.6)

## 2017-06-11 LAB — PREPARE RBC (CROSSMATCH)

## 2017-06-11 MED ORDER — FAMOTIDINE IN NACL 20-0.9 MG/50ML-% IV SOLN
20.0000 mg | Freq: Two times a day (BID) | INTRAVENOUS | Status: DC
  Administered 2017-06-11 – 2017-06-28 (×34): 20 mg via INTRAVENOUS
  Filled 2017-06-11 (×35): qty 50

## 2017-06-11 MED ORDER — TRACE MINERALS CR-CU-MN-SE-ZN 10-1000-500-60 MCG/ML IV SOLN
INTRAVENOUS | Status: AC
  Administered 2017-06-11: 17:00:00 via INTRAVENOUS
  Filled 2017-06-11: qty 2760

## 2017-06-11 MED ORDER — POTASSIUM CHLORIDE 10 MEQ/100ML IV SOLN
10.0000 meq | INTRAVENOUS | Status: AC
  Administered 2017-06-11 (×3): 10 meq via INTRAVENOUS
  Filled 2017-06-11 (×3): qty 100

## 2017-06-11 MED ORDER — SODIUM CHLORIDE 0.9 % IV SOLN
Freq: Once | INTRAVENOUS | Status: AC
  Administered 2017-06-11: 11:00:00 via INTRAVENOUS

## 2017-06-11 MED ORDER — ACETAMINOPHEN 650 MG RE SUPP: 325.0000 mg | RECTAL | Status: DC | PRN

## 2017-06-11 MED ORDER — SODIUM CHLORIDE 4 MEQ/ML IV SOLN: INTRAVENOUS | Status: DC

## 2017-06-11 NOTE — Progress Notes (Signed)
Patient ID: [name] [name], male   DOB: 07/01/1961, 55 y.o.   MRN: [id number]     

## 2017-06-11 NOTE — Care Management Note (Signed)
Case Management Note  Patient Details  Name: [name] [name] MRN: [id number] Date of Birth: 1961/09/10  Subjective/Objective:    To or [id number] for wound vac change due to fecal material in wound base and possible fistula.                Action/Plan: Date:  June 11, 2017 Chart reviewed for concurrent status and case management needs. Will continue to follow patient progress. Discharge Planning: following for needs Expected discharge date: [id number] [name], BSN, [place], [place]  Expected Discharge Date:                  Expected Discharge Plan:  Home/Self Care  In-House Referral:     Discharge planning Services  CM Consult  Post Acute Care Choice:    Choice offered to:     DME Arranged:    DME Agency:     HH Arranged:    HH Agency:     Status of Service:  In process, will continue to follow  If discussed at Long Length of Stay Meetings, dates discussed:    Additional Comments:  Leeroy Cha, RN 06/11/2017, 8:41 AM

## 2017-06-11 NOTE — Progress Notes (Addendum)
PHARMACY - ADULT TOTAL PARENTERAL NUTRITION CONSULT NOTE   Pharmacy Consult for TPN Indication: prolonged ileus, small bowel leak, multiple bowel surgeries  Patient Measurements: Body mass index is 25.37 kg/m. Filed Weights   06/09/17 0500 06/10/17 0600 06/11/17 0700  Weight: 175 lb 4.3 oz (79.5 kg) 168 lb 10.4 oz (76.5 kg) 176 lb 12.9 oz (80.2 kg)   HPI: 55 yoM admitted on 7/11 for enlarging adrenal mass and planned adrenalectomy.  Pharmacy consulted to dose TPN.  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   7/22 OR:  wash out, tube ileostomy, left with open abdomen & wound vac in place.  Plan for OR on 7/25 for closure 7/25 OR: mesh placed for abdominal closure, wound vac 8/2 extubated, propofol drip off 8/4 TPN off for ~ 5 hours due to line detached from filter 8/10 MD request decrease rate x 1 week 2nd low sodium 8/12 Additional sodium added to TPN due to persistent hyponatremia 8/15 Increasing rate back to goal since sodium has improved with above intervention  Insulin requirements past 24 hours: 4 units SSI (no hx DM)  Current Nutrition: NPO, ice chips  IVF: D5 1/2NS at10 ml/hr  Central access: PICC line ordered 7/19, placed on 7/20 TPN start date: 7/20  ASSESSMENT                                                                                                           Today, 06/11/17  - Glucose: at goal (goal < 150) - Electrolytes:  Na now normal at 138 with the addition of sodium to TPN: now with a total of 154 meq Na/L - equivalent to NS, standard TPN has ~ 70 meq/L  Phos high 4.9, CoCa 10.3, CaxPhos  = ~50.47  goal < 55   K 3.5, decreasing   Mag 1.6  - Renal:  SCr wnl, stable. I/O: +1042,   NGO 400 - Weight:76.5,  Was 93.3kg on 7/31 - GI: VAC removed, drains in - LFTs: WNL x alk phos 174.   Tbili WNL  - TGs:  296 (7/18), 266 (7/20), 269 (7/23), 210 (7/28), 285 (7/31) 301 (8/3), increased 323 on 814  - Prealbumin:  <5 (7/20), 6.1 (7/24), 10.1 (7/30) 24.8 (8/6), now normal 26.4 (8/13)  NUTRITIONAL GOALS                                                                                               RD recs (8/10):  122-138 gms Protein (1.5 - 1.7 g/kg) 2030 - 2270 Kcal  Clinimix  E 5/15 at a max fluid volume of 3L at 120 ml/hr  + 20% fat emulsion 240 ml/day to provide: 144 g of protein and 2568 kCals per day - Glucose infusion rate will be 3.2 mg/kg/min (Maximum 5 mg/kg/min)  Clinimix 5/20 (no electrolytes) at rate of 115 ml/hr with lipids 3x/week on MWF only would provide: 138g protein and avg 2174 kcal/day - Glucose infusion rate will be 4.11 mg/kg/min (Maximum 5 mg/kg/min)   PLAN                                                                                                                          8/10 Discussed with Dr. [name] 8/10 - concerned about hyponatremia, possibly due to high-volume TPN. Requested to decrease rate for a short time (~1 week) and monitor sodium levels closely. Understands that this will decrease the amount of protein the patient will be receiving for the time being but prealbumin has improved and wound is healing.   8/16 Discussed with Dr. [name] - since sodium levels have improved, appropriate to increase TPN rate back to goal.  Will continue no electrolyte formula due to elevated phosphorus but continue to add sodium.  Now: Potassium chloride 10meq IV x 3  At 1800 today: ? Continue Clinimix 5/20 no electrolyte formula due to elevated phosphorus product but continue to add sodium ~125 meq/L; increase rate to 115 ml/hr (~2.76L volume per day).   Lipids  at 20mls/hr x 12 hours 3 times weekly on MWF ? This provides 138 gm protein and 2174 Kcals   TPN to contain standard multivitamins and trace elements daily  Continue IVF at KVO  CBGs and moderate SSI q6h.  TPN lab panels on Mondays & Thursdays.  Check BMET and Phos in AM   , PharmD, BCPS Pager: [phone] 06/11/2017 9:24 AM   

## 2017-06-11 NOTE — Progress Notes (Signed)
OT Cancellation Note  Patient Details Name: [name] [name] MRN: [id number] DOB: [birth year]   Cancelled Treatment:    Reason Eval/Treat Not Completed: Fatigue/lethargy limiting ability to participate.  Pt having difficulty staying aroused. Will try to check back tomorrow.  [name] 06/11/2017, 3:20 PM  [name], OTR/L [phone] 06/11/2017

## 2017-06-11 NOTE — Consult Note (Signed)
[place] Nurse wound follow up Wound type:Surgical with complications. Sister in room for wound dressing change and is asking many questions that are appropriate for surgeon, such as how long will we be using the [facility] and when will wound closure be expected and will patient spend the entire time required for wound healing in the hospital. Patient's sister also asks these questions of CCS PA and she refers her to the surgeon. I explain to patient's sister the mechanism of NPWT and she expresses appreciation for explanation. Measurement: 5.5cm x 23.5cm x 1.8cm Wound bed: 80% red with mesh lifting in center.  Brown feculent material in inferior portion of wound at center and lifting mesh encompasses approximately 20% of wound bed. Brown/feculent material noted beneath mesh and in NPWT cannister and tubing.  White foam with brown feculent material lifts easily without adherence. Drainage (amount, consistency, odor) See above Periwound:Intact with epidermal edge rolling from 3-9 o'clock and from 10-2 o'clock. Dressing procedure/placement/frequency: Drape removed using medical adhesive releaser.  Wound contact layers removed, (4), three black and 1 white.  Wound cleansed with NS, and gently patted dry.  Izora Gala, CCS PA in to see wound and to photograph for medical record.  Wound redressed using 1 piece of white foam in the center of the wound bed and one piece of black foam to obliterate the dead space.  I am assisted in placing the VAC drape over the wound while maintaining black foam position by his bedside RN. Drape applied, dressing connected to continuous negative pressure at 188mmHg and an immediate seal is achieved. The dressing is labeled.  upplies are in room for next dressing change on Saturday, August 18. Dressing change will be performed by my partner. [place] nursing team will follow along with you, and will remain available to this patient, the nursing, surgical and medical teams.  Thanks, Maudie Flakes, MSN, RN, [place], Arther Abbott  Pager# [phone]

## 2017-06-11 NOTE — Progress Notes (Signed)
[facility] Consultation Progress Note  Patient: GLENDA KUNST KCL:[id number]   PCP: Lujean Amel, MD DOB: August 26, 1961   DOA: 05/06/2017   DOS: 06/11/2017   Date of Service: the patient was seen and examined on 06/11/2017 Primary service: [name], Arta Bruce, MD   Subjective: No significant abdominal pain. Occasional dizziness. Feeling fatigue. Feeling hungry. No nausea no vomiting. No chest pain.  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 Patient's course was complicated by small bowel injury requiring resection with anastomoses and placement of wound VAC. On 7/18, patient developed leak and was taken back to the operating room for redo of his exploratory laparotomy and repair of leak. That time, patient returned to the ICU on the ventilator. Critical care was consulted for ventilator/medical management.  Over the next 2 weeks, patient's hospital course was complicated with the following: 07/19 - NGT pulled out and almost self-extubated. Developed cuff leak >> tube exchanged. Back to OR emergently for intestinal leak/ischemic bowel.TPN started at that time 07/22 - Back to OR for wash out >> left with open abdomen & wound vac in place. Transfused 1u PRBC. 07/25 - rising temp, WBC increased. Returned to OR for closure 07/27 - Recultured, sedation down on precedex [facility] 7/27 >> MRSE 1/2 > contaminant ? 07/30 - Remains on sedation, VAC leak over the weekend with dressing change by [facility] 07/31 - Initial attempt at weaning failed to due to tachypnea/anxiety 08/02 - Fever 101.1, WBC 14.2, weaned / extubated 08/03 - O2 weaned to 1L. Remains on dilaudid, initially on gtt now PCA. RN reports  precedex started overnight for anxiety. 08/04 - Ultrasound-guided aspiration By interventional radiology of small perihepatic fluid collection yielded only 1 mL of the fluid, sent for culture. No growth yet 08/06 - patient improving. Off Precedex drip since 8/4. Still on TPN 08/08 - wound vac changed per surgery. WBC 16. Abx d/c per surgery  08/13 - Patient with increased tachycardia and developed a fever.  Wound VAC noted with feculent material.  Started on IV Zosyn 08/14 - CT abdomen pelvis shows extraluminal stool in the right lower quadrant, surgery expects conservative management.  Currently further plan is continue IV antibiotics transition to oral when stable.  Assessment and Plan: 1. Sepsis- resolved now due to peritonitis and MSSA HCAP.   white count has been overall stable in the last couple of weeks, and antibiotics were discontinued by general surgery on 8/8.   Zosyn had to be restarted however on 8/13 due to concern for recurrent sepsis given fever and elevated heart rate. Heart rate as well as leukocytosis getting better. Patient primarily have leaking bowel, will defer to surgery for further management for definitive management, currently plan is conservative management with frequent wound VAC dressing changes.  At present we'll continue IV antibiotics for a total of 7-10 days.  2. Peritonitis/intestinal perforation/anastomotic leak - 05/29/2017 CT abdomen and pelvis thick walled fluid collection along anterior and lateral aspects of the liver; extends 20 cm along anterior and lateral margins of liver.   Interventional radiology was consulted and on 05/30/2017 he underwent aspiration.  Cultures have remained negative. 05/20/17--ABDOMINAL WOUND [place] OUT, WOUND CLOSURE AND WOUND VAC PLACEMENT.  NG remains in place, patient will  need to be on TPN for quite some time per general surgery  3. Acute respiratory failure with hypoxia -resolved Secondary to sepsis and HCAP post op  adrenalectomy.  Currently he is stable on nasal cannula.  He was intubated and has been extubated since 05/28/2017.  Continue DuoNeb's. -Wean off oxygen as tolerated  4. Sinus tachycardia -ongoing, likely related to peritonitis -2D echo obtained 8/13 showed an EF of 55-60% and grade 1 diastolic dysfunction  5. Acute postoperative blood loss anemia. Hemoglobin slowly trending downwards over last few days, initial drop was due to postoperative blood loss anemia. Current downward trend is most likely dilutional in the setting of receiving TPN as the patient is a 11 L positive. Patient is getting one PRBC due to being symptomatic. Will monitor.  There is some concerns for coffee-ground return in his NG tube therefore he is on SCDs and his Lovenox is on hold. On Protonix injectable twice a day as well as Pepcid added today.  6. Hyponatremia, hypomagnesemia, hypokalemia, hyperphosphatemia -Suspect due to TPN, -TSH normal - Daily monitoring and correction  7 MSSA HCAP  finished 7 days of cefepime, afebrile, stable respiratory status  8. R-adrenal mass/pheochromocytoma  -s/p R-adrenalectomy, surgery was complicated by small bowel injury in the setting of adhesions with sepsis secondary to intra-abdominal infection Blood pressure remained stable after the surgery.  9. Acute metabolic encephalopathy -multifactorial including sepsis, infection, electrolyte derangement, opioids/benzos.  Stable  10 Thrombocytosis -likely acute phase reactant and possible iron deficiency -pt had normal platelet count at time of admission  11. Essential Hypertension -remains stable off home clonidine, labetalol, chlorthalidone, valsartan  12. Chronic pain syndrome/Post-op pain -had significant post-op pain -remains on dilaudid PCA, po oxycodone scheduled, ativan scheduled -pain management per CCS -previously on home gaba, percocet  13. Hyperlipidemia -remain off lipitor until able to tolerate po  reliably  14 Tobacco abuse -nicoderm patch  15 Hyperglycemia -likely stress induced and TPN -A1C was 5.6, not a diabetic -continue novolog sliding scale  Diet: NPO on TPN DVT Prophylaxis: SCD  Family Communication: family was present at bedside, at the time of interview. The pt provided permission to discuss medical plan with the family. Opportunity was given to ask question and all questions were answered satisfactorily.   Disposition: We will continue to follow the patient.   Recommendation on discharge: To be determined.   Other Consultants: Wound care, critical care Procedures: Echocardiogram, rest per progress note Antibiotics: Anti-infectives    Start     Dose/Rate Route Frequency Ordered Stop   06/08/17 1200  piperacillin-tazobactam (ZOSYN) IVPB 3.375 g     3.375 g 12.5 mL/hr over 240 Minutes Intravenous Every 8 hours 06/08/17 1056     05/29/17 2200  ceFEPIme (MAXIPIME) 2 g in dextrose 5 % 50 mL IVPB  Status:  Discontinued     2 g 100 mL/hr over 30 Minutes Intravenous Every 8 hours 05/29/17 2031 06/03/17 0832   05/29/17 2100  metroNIDAZOLE (FLAGYL) IVPB 500 mg  Status:  Discontinued     500 mg 100 mL/hr over 60 Minutes Intravenous Every 8 hours 05/29/17 2029 06/03/17 0832   05/26/17 2200  ceFAZolin (ANCEF) IVPB 1 g/50 mL premix  Status:  Discontinued     1 g 100 mL/hr over 30 Minutes Intravenous Every 8 hours 05/26/17 1008 05/29/17 2023   05/25/17 1100  vancomycin (VANCOCIN) 1,250 mg in sodium chloride 0.9 % 250 mL IVPB  Status:  Discontinued     1,250 mg 166.7 mL/hr over  90 Minutes Intravenous Every 12 hours 05/25/17 0955 05/26/17 0946   05/22/17 1400  anidulafungin (ERAXIS) 100 mg in sodium chloride 0.9 % 100 mL IVPB     100 mg 78 mL/hr over 100 Minutes Intravenous Every 24 hours 05/21/17 1330 05/27/17 1740   05/21/17 1400  anidulafungin (ERAXIS) 200 mg in sodium chloride 0.9 % 200 mL IVPB     200 mg 78 mL/hr over 200 Minutes Intravenous  Once 05/21/17 1330  05/21/17 1940   05/15/17 1000  anidulafungin (ERAXIS) 100 mg in sodium chloride 0.9 % 100 mL IVPB  Status:  Discontinued     100 mg 78 mL/hr over 100 Minutes Intravenous Every 24 hours 05/14/17 0829 05/16/17 0901   05/15/17 1000  metroNIDAZOLE (FLAGYL) IVPB 500 mg  Status:  Discontinued     500 mg 100 mL/hr over 60 Minutes Intravenous Every 8 hours 05/15/17 0903 05/25/17 0938   05/14/17 0900  anidulafungin (ERAXIS) 200 mg in sodium chloride 0.9 % 200 mL IVPB     200 mg 78 mL/hr over 200 Minutes Intravenous  Once 05/14/17 0829 05/14/17 1325   05/13/17 1927  vancomycin (VANCOCIN) 1-5 GM/200ML-% IVPB    Comments:  Ward, Christa   : cabinet override      05/13/17 1927 05/14/17 0744   05/12/17 1400  ceFEPIme (MAXIPIME) 1 g in dextrose 5 % 50 mL IVPB     1 g 100 mL/hr over 30 Minutes Intravenous Every 8 hours 05/12/17 0939 05/26/17 2359   05/12/17 0900  vancomycin (VANCOCIN) IVPB 1000 mg/200 mL premix  Status:  Discontinued     1,000 mg 200 mL/hr over 60 Minutes Intravenous Every 12 hours 05/12/17 0750 05/16/17 0852   05/12/17 0830  aztreonam (AZACTAM) 2 GM IVPB     2 g 100 mL/hr over 30 Minutes Intravenous  Once 05/12/17 0800 05/12/17 0957   05/06/17 0916  vancomycin (VANCOCIN) IVPB 1000 mg/200 mL premix     1,000 mg 200 mL/hr over 60 Minutes Intravenous On call to O.R. 05/06/17 0916 05/06/17 1229      Objective: Physical Exam: Vitals:   06/11/17 0700 06/11/17 0800 06/11/17 1000 06/11/17 1200  BP:  110/77 106/67 114/71  Pulse:  (!) 104 (!) 107 (!) 101  Resp:  (!) 21 (!) 25 (!) 24  Temp:  97.9 F (36.6 C)  98.5 F (36.9 C)  TempSrc:  Oral  Oral  SpO2:  95% 94% 95%  Weight: 80.2 kg (176 lb 12.9 oz)     Height:        Intake/Output Summary (Last 24 hours) at 06/11/17 1357 Last data filed at 06/11/17 0600  Gross per 24 hour  Intake          2230.38 ml  Output             1620 ml  Net           610.38 ml   Filed Weights   06/09/17 0500 06/10/17 0600 06/11/17 0700  Weight:  79.5 kg (175 lb 4.3 oz) 76.5 kg (168 lb 10.4 oz) 80.2 kg (176 lb 12.9 oz)   General: Alert, Awake and Oriented to Time, Place and Person. Appear in moderate distress, affect flat Eyes: PERRL, Conjunctiva normal ENT: Oral Mucosa clear moist. Neck: difficult to assess JVD, no Abnormal Mass Or lumps Cardiovascular: S1 and S2 Present, no Murmur, Peripheral Pulses Present Respiratory: normal respiratory effort, Bilateral Air entry equal and Decreased, no use of accessory muscle, Clear to Auscultation, no Crackles,  no wheezes Abdomen: Bowel Sound present, Soft and diffuse tenderness, large open abdominal wound seen with woundcare nurse. Extremities: trace Pedal edema, no calf tenderness Neurologic: Grossly no focal neuro deficit. Bilaterally Equal motor strength   Data Reviewed: CBC:  Recent Labs Lab 06/08/17 0525 06/09/17 0600 06/10/17 0603 06/11/17 0804  WBC 15.4* 13.8* 13.0* 12.2*  NEUTROABS 8.9*  --   --  7.1  HGB 9.3* 8.4* 8.1* 7.7*  HCT 28.0* 26.6* 25.1* 24.2*  MCV 79.5 79.4 79.7 80.9  PLT 635* 560* 529* 545*   Basic Metabolic Panel:  Recent Labs Lab 06/06/17 1159 06/07/17 0658 06/08/17 0525 06/09/17 0600 06/10/17 0603 06/10/17 0616 06/11/17 0526  NA 129* 129* 132* 136 138  --  142  K 4.5 4.4 4.4 3.9 3.5  --  3.4*  CL 96* 93* 95* 94* 97*  --  101  CO2 24 23 27 30  32  --  34*  GLUCOSE 132* 127* 122* 150* 121*  --  110*  BUN 31* 35* 36* 32* 26*  --  24*  CREATININE 0.82 0.86 0.95 0.98 0.95  --  0.87  CALCIUM 9.7 9.7 9.5 9.3 9.1  --  9.1  MG 1.9 1.9 1.7  --   --  1.6* 1.8  PHOS  --  6.5* 5.8* 5.7* 4.9*  --  5.0*   Liver Function Tests:  Recent Labs Lab 06/05/17 0308 06/06/17 1159 06/08/17 0525 06/10/17 0603 06/11/17 0526  AST 31 35 32 23 27  ALT 25 31 27 21 21   ALKPHOS 245* 259* 212* 174* 165*  BILITOT 0.8 1.0 0.8 0.6 0.6  PROT 7.7 8.4* 7.8 7.1 7.0  ALBUMIN 2.6* 2.9* 2.8* 2.5* 2.4*   No results for input(s): LIPASE, AMYLASE in the last 168 hours. No  results for input(s): AMMONIA in the last 168 hours. Cardiac Enzymes: No results for input(s): CKTOTAL, CKMB, CKMBINDEX, TROPONINI in the last 168 hours. BNP (last 3 results) No results for input(s): BNP in the last 8760 hours. CBG:  Recent Labs Lab 06/10/17 1154 06/10/17 1601 06/10/17 2344 06/11/17 0540 06/11/17 1315  GLUCAP 128* 123* 116* 80 120*   No results found for this or any previous visit (from the past 240 hour(s)).  Studies: No results found.   Scheduled Meds: . chlorhexidine  15 mL Mouth Rinse BID  . Chlorhexidine Gluconate Cloth  6 each Topical Q1200  . HYDROmorphone   Intravenous Q4H  . insulin aspart  0-15 Units Subcutaneous Q6H  . ipratropium-albuterol  3 mL Nebulization BID  . lip balm  1 application Topical BID  . LORazepam  2 mg Oral Q6H  . mouth rinse  15 mL Mouth Rinse q12n4p  . nicotine  21 mg Transdermal Daily  . oxyCODONE  10 mg Per Tube Q4H  . pantoprazole (PROTONIX) IV  40 mg Intravenous Q12H  . thiamine injection  100 mg Intravenous Daily   Continuous Infusions: . sodium chloride    . chlorproMAZINE (THORAZINE) IV Stopped (06/09/17 1649)  . dextrose 5 % and 0.45% NaCl 10 mL/hr at 06/08/17 1200  . famotidine (PEPCID) IV Stopped (06/11/17 6256)  . piperacillin-tazobactam (ZOSYN)  IV 3.375 g (06/11/17 1321)  . TPN (CLINIMIX) Adult without lytes 83 mL/hr at 06/10/17 1900  . TPN (CLINIMIX) Adult without lytes     PRN Meds: acetaminophen, chlorproMAZINE (THORAZINE) IV, diphenhydrAMINE, HYDROmorphone, iopamidol, labetalol, LORazepam, magic mouthwash, menthol-cetylpyridinium, naloxone **AND** sodium chloride flush, ondansetron **OR** ondansetron (ZOFRAN) IV, phenol, sodium chloride flush  Time spent: 45 minutes  Author: Berle Mull, MD [facility] Pager: [phone] 06/11/2017 1:57 PM  If 7PM-7AM, please contact night-coverage at [email or web address], password [email or web address]

## 2017-06-11 NOTE — Progress Notes (Signed)
PT Cancellation Note  Patient Details Name: WILBON OBENCHAIN MRN: [id number] DOB: [birth year]   Cancelled Treatment:     pt very sleepy/groggy with BP 102/64.  Will attempt to see in am.   [name]  PTA [facility]  Acute  Rehab Pager      [phone]

## 2017-06-11 NOTE — Progress Notes (Signed)
Progress Note: General Surgery Service   Assessment/Plan: Patient Active Problem List   Diagnosis Date Noted  . Peritonitis ([place]) 06/01/2017  . Pressure injury of skin 05/29/2017  . Mucus plugging of bronchi   . Acute respiratory failure with hypoxemia ([place])   . Ileus ([place])   . Hypokalemia 05/14/2017  . Anastomotic leak of intestine 05/14/2017  . Severe sepsis ([place]) 05/14/2017  . Wound dehiscence, surgical 05/14/2017  . Aspiration pneumonia ([place]) 05/14/2017  . Chronic narcotic use 05/10/2017  . Anxiety state 05/10/2017  . Intra-abdominal adhesions s/p SB resection 05/06/2017 05/09/2017  . H/O total adrenalectomy ([place]) 05/06/2017  . Right adrenal mass s/p adrenalectomy 05/06/2017 05/06/2017  . Throat symptom 03/18/2016  . Multinodular goiter 01/19/2016  . Hyperthyroidism 01/19/2016  . Essential hypertension   . Diarrhea 11/30/2014  . Anemia, iron deficiency 11/01/2014  . Leukocytosis 11/03/2013  . Tobacco abuse 07/26/2013  . COPD (chronic obstructive pulmonary disease) ([place]) 07/26/2013  . Left knee pain 07/26/2013  . Pain in joint, shoulder region 05/03/2013  . GERD 07/24/2008  . 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   s/p Procedure(s): ABDOMINAL WOUND [place] OUT, WOUND CLOSURE AND WOUND VAC PLACEMENT 05/20/2017  Symptomatic anemia - transfuse 1 unit today  Malnutrition -Na improved, increase volume and protein again  Intestinal leak/discontintuiy -NG decreased and ileal tube increased, could be sign of improved intestinal function -   LOS: 36 days  Chief Complaint/Subjective: No events overnight  Objective: Vital signs in last 24 hours: Temp:  [97.9 F (36.6 C)-98.6 F (37 C)] 97.9 F (36.6 C) (08/16 0800) Pulse Rate:  [99-109] 104 (08/16 0800) Resp:  [19-25] 21 (08/16 0800) BP: (104-121)/(66-77) 110/77 (08/16 0800) SpO2:  [93 %-97 %] 95 % (08/16 0800) Weight:  [80.2 kg (176 lb 12.9 oz)] 80.2 kg (176 lb 12.9 oz) (08/16 0700) Last BM Date: 06/07/17  (has an ileostomy)  Intake/Output from previous day: 08/15 0701 - 08/16 0700 In: 2994.4 [I.V.:2344.4; NG/GT:400; IV Piggyback:200] Out: 1620 [Urine:550; Emesis/NG output:300; Drains:770] Intake/Output this shift: No intake/output data recorded.  Lungs: CTAB  Cardiovascular: tachycardic  Abd: vac in position, thin brown fluid from drain 2, minimal drainage from drain one, minimal drainage from blake  Extremities: no edema  Neuro: AOx4  Lab Results: CBC   Recent Labs  06/10/17 0603 06/11/17 0804  WBC 13.0* 12.2*  HGB 8.1* 7.7*  HCT 25.1* 24.2*  PLT 529* 503*   BMET  Recent Labs  06/10/17 0603 06/11/17 0526  NA 138 142  K 3.5 3.4*  CL 97* 101  CO2 32 34*  GLUCOSE 121* 110*  BUN 26* 24*  CREATININE 0.95 0.87  CALCIUM 9.1 9.1   PT/INR No results for input(s): LABPROT, INR in the last 72 hours. ABG No results for input(s): PHART, HCO3 in the last 72 hours.  Invalid input(s): PCO2, PO2  Studies/Results:  Anti-infectives: Anti-infectives    Start     Dose/Rate Route Frequency Ordered Stop   06/08/17 1200  piperacillin-tazobactam (ZOSYN) IVPB 3.375 g     3.375 g 12.5 mL/hr over 240 Minutes Intravenous Every 8 hours 06/08/17 1056     05/29/17 2200  ceFEPIme (MAXIPIME) 2 g in dextrose 5 % 50 mL IVPB  Status:  Discontinued     2 g 100 mL/hr over 30 Minutes Intravenous Every 8 hours 05/29/17 2031 06/03/17 0832   05/29/17 2100  metroNIDAZOLE (FLAGYL) IVPB 500 mg  Status:  Discontinued     500 mg 100 mL/hr over 60 Minutes Intravenous Every  8 hours 05/29/17 2029 06/03/17 0832   05/26/17 2200  ceFAZolin (ANCEF) IVPB 1 g/50 mL premix  Status:  Discontinued     1 g 100 mL/hr over 30 Minutes Intravenous Every 8 hours 05/26/17 1008 05/29/17 2023   05/25/17 1100  vancomycin (VANCOCIN) 1,250 mg in sodium chloride 0.9 % 250 mL IVPB  Status:  Discontinued     1,250 mg 166.7 mL/hr over 90 Minutes Intravenous Every 12 hours 05/25/17 0955 05/26/17 0946   05/22/17 1400   anidulafungin (ERAXIS) 100 mg in sodium chloride 0.9 % 100 mL IVPB     100 mg 78 mL/hr over 100 Minutes Intravenous Every 24 hours 05/21/17 1330 05/27/17 1740   05/21/17 1400  anidulafungin (ERAXIS) 200 mg in sodium chloride 0.9 % 200 mL IVPB     200 mg 78 mL/hr over 200 Minutes Intravenous  Once 05/21/17 1330 05/21/17 1940   05/15/17 1000  anidulafungin (ERAXIS) 100 mg in sodium chloride 0.9 % 100 mL IVPB  Status:  Discontinued     100 mg 78 mL/hr over 100 Minutes Intravenous Every 24 hours 05/14/17 0829 05/16/17 0901   05/15/17 1000  metroNIDAZOLE (FLAGYL) IVPB 500 mg  Status:  Discontinued     500 mg 100 mL/hr over 60 Minutes Intravenous Every 8 hours 05/15/17 0903 05/25/17 0938   05/14/17 0900  anidulafungin (ERAXIS) 200 mg in sodium chloride 0.9 % 200 mL IVPB     200 mg 78 mL/hr over 200 Minutes Intravenous  Once 05/14/17 0829 05/14/17 1325   05/13/17 1927  vancomycin (VANCOCIN) 1-5 GM/200ML-% IVPB    Comments:  Ward, Christa   : cabinet override      05/13/17 1927 05/14/17 0744   05/12/17 1400  ceFEPIme (MAXIPIME) 1 g in dextrose 5 % 50 mL IVPB     1 g 100 mL/hr over 30 Minutes Intravenous Every 8 hours 05/12/17 0939 05/26/17 2359   05/12/17 0900  vancomycin (VANCOCIN) IVPB 1000 mg/200 mL premix  Status:  Discontinued     1,000 mg 200 mL/hr over 60 Minutes Intravenous Every 12 hours 05/12/17 0750 05/16/17 0852   05/12/17 0830  aztreonam (AZACTAM) 2 GM IVPB     2 g 100 mL/hr over 30 Minutes Intravenous  Once 05/12/17 0800 05/12/17 0957   05/06/17 0916  vancomycin (VANCOCIN) IVPB 1000 mg/200 mL premix     1,000 mg 200 mL/hr over 60 Minutes Intravenous On call to O.R. 05/06/17 0916 05/06/17 1229      Medications: Scheduled Meds: . chlorhexidine  15 mL Mouth Rinse BID  . Chlorhexidine Gluconate Cloth  6 each Topical Q1200  . HYDROmorphone   Intravenous Q4H  . insulin aspart  0-15 Units Subcutaneous Q6H  . ipratropium-albuterol  3 mL Nebulization BID  . lip balm  1  application Topical BID  . LORazepam  2 mg Oral Q6H  . mouth rinse  15 mL Mouth Rinse q12n4p  . nicotine  21 mg Transdermal Daily  . oxyCODONE  10 mg Per Tube Q4H  . pantoprazole (PROTONIX) IV  40 mg Intravenous Q12H  . thiamine injection  100 mg Intravenous Daily   Continuous Infusions: . sodium chloride    . chlorproMAZINE (THORAZINE) IV Stopped (06/09/17 1649)  . dextrose 5 % and 0.45% NaCl 10 mL/hr at 06/08/17 1200  . famotidine (PEPCID) IV Stopped (06/11/17 9741)  . piperacillin-tazobactam (ZOSYN)  IV Stopped (06/11/17 0744)  . potassium chloride 10 mEq (06/11/17 1010)  . TPN (CLINIMIX) Adult without lytes 83 mL/hr  at 06/10/17 1900  . TPN (CLINIMIX) Adult without lytes     PRN Meds:.acetaminophen, chlorproMAZINE (THORAZINE) IV, diphenhydrAMINE, HYDROmorphone, iopamidol, labetalol, LORazepam, magic mouthwash, menthol-cetylpyridinium, naloxone **AND** sodium chloride flush, ondansetron **OR** ondansetron (ZOFRAN) IV, phenol, sodium chloride flush  Mickeal Skinner, MD Pg# [phone] [facility]

## 2017-06-11 NOTE — Progress Notes (Signed)
Pharmacy Antibiotic Note  [name] [name] is a 56 y.o. male admitted on 05/06/2017.  Pharmacy has been consulted for  Dosing for possible wound infection.  Today, 06/11/2017 Day #4 Zosyn Afebrile WBC 13, decreased SCr 0.87, stable CrCl 97 ml/min  Plan: Continue Zosyn 3.375gm IV q8h (4hr extended infusions) F/u duration of therapy  Height: 5\' 10"  (177.8 cm) Weight: 168 lb 10.4 oz (76.5 kg) IBW/kg (Calculated) : 73  Temp (24hrs), Avg:98.3 F (36.8 C), Min:97.8 F (36.6 C), Max:98.6 F (37 C)   Recent Labs Lab 06/07/17 0658 06/08/17 0525 06/09/17 0600 06/10/17 0603 06/11/17 0526  WBC  --  15.4* 13.8* 13.0*  --   CREATININE 0.86 0.95 0.98 0.95 0.87    Estimated Creatinine Clearance: 97.9 mL/min (by C-G formula based on SCr of 0.87 mg/dL).    Allergies  Allergen Reactions  . Chantix [Varenicline] Palpitations    Heart race Increased panic attacks  . Morphine Itching  . Penicillins Rash    Has patient had a PCN reaction causing immediate rash, facial/tongue/throat swelling, SOB or lightheadedness with hypotension: Yes Has patient had a PCN reaction causing severe rash involving mucus membranes or skin necrosis: Unknown Has patient had a PCN reaction that required hospitalization: No Has patient had a PCN reaction occurring within the last 10 years: No If all of the above answers are "NO", then may proceed with Cephalosporin use. Tolerating Zosyn 05/2017     Antimicrobials this admission: 7/17 Vancomcyin >> 7/21, resume 7/30 >> 7/31 7/17 Cefepime >> 7/31, resume 8/3 >> 8/8 7/19 Anidulafungin >> 7/21, resume 7/26 >>  7/20 metronidazole >> 7/30, resume 8/3 >> 8/8 7/31 Cefazolin >> 8/3 8/13 Zosyn >>  Dose adjustments this admission:  Microbiology results: 7/11 MRSA PCR: negative 7/18 Abdominal culture:  moderate Klebsiella pna - Pan sens except amp 7/19 BCx: NGF 7/22 Trach asp: multple orgs, none predominant 7/27 Trach asp: rare MSSA 7/27 BCx: 1/2 CoNS 8/4  perihepatic fluid: NGF  Thank you for allowing pharmacy to be a part of this patient's care.  Peggyann Juba, PharmD, BCPS Pager: [phone] 06/11/2017 7:40 AM

## 2017-06-12 LAB — CBC
HEMATOCRIT: 27.4 % — AB (ref 39.0–52.0)
Hemoglobin: 8.8 g/dL — ABNORMAL LOW (ref 13.0–17.0)
MCH: 25.7 pg — AB (ref 26.0–34.0)
MCHC: 32.1 g/dL (ref 30.0–36.0)
MCV: 79.9 fL (ref 78.0–100.0)
PLATELETS: 453 10*3/uL — AB (ref 150–400)
RBC: 3.43 MIL/uL — AB (ref 4.22–5.81)
RDW: 16.4 % — ABNORMAL HIGH (ref 11.5–15.5)
WBC: 11.1 10*3/uL — AB (ref 4.0–10.5)

## 2017-06-12 LAB — BPAM RBC
Blood Product Expiration Date: 201809062359
ISSUE DATE / TIME: 201808161515
Unit Type and Rh: 600

## 2017-06-12 LAB — TYPE AND SCREEN
ABO/RH(D): A NEG
ANTIBODY SCREEN: NEGATIVE
UNIT DIVISION: 0

## 2017-06-12 LAB — COMPREHENSIVE METABOLIC PANEL
ALT: 18 U/L (ref 17–63)
ANION GAP: 6 (ref 5–15)
AST: 23 U/L (ref 15–41)
Albumin: 2.4 g/dL — ABNORMAL LOW (ref 3.5–5.0)
Alkaline Phosphatase: 145 U/L — ABNORMAL HIGH (ref 38–126)
BUN: 23 mg/dL — AB (ref 6–20)
CHLORIDE: 106 mmol/L (ref 101–111)
CO2: 30 mmol/L (ref 22–32)
Calcium: 9.1 mg/dL (ref 8.9–10.3)
Creatinine, Ser: 0.76 mg/dL (ref 0.61–1.24)
Glucose, Bld: 98 mg/dL (ref 65–99)
POTASSIUM: 3.6 mmol/L (ref 3.5–5.1)
Sodium: 142 mmol/L (ref 135–145)
TOTAL PROTEIN: 6.8 g/dL (ref 6.5–8.1)
Total Bilirubin: 0.8 mg/dL (ref 0.3–1.2)

## 2017-06-12 LAB — GLUCOSE, CAPILLARY
Glucose-Capillary: 94 mg/dL (ref 65–99)
Glucose-Capillary: 122 mg/dL — ABNORMAL HIGH (ref 65–99)
Glucose-Capillary: 120 mg/dL — ABNORMAL HIGH (ref 65–99)

## 2017-06-12 LAB — PHOSPHORUS: Phosphorus: 4.2 mg/dL (ref 2.5–4.6)

## 2017-06-12 LAB — MAGNESIUM: Magnesium: 1.4 mg/dL — ABNORMAL LOW (ref 1.7–2.4)

## 2017-06-12 MED ORDER — FAMOTIDINE IN NACL 20-0.9 MG/50ML-% IV SOLN
20.0000 mg | Freq: Once | INTRAVENOUS | Status: AC
  Administered 2017-06-13: 20 mg via INTRAVENOUS

## 2017-06-12 MED ORDER — TRACE MINERALS CR-CU-MN-SE-ZN 10-1000-500-60 MCG/ML IV SOLN
INTRAVENOUS | Status: AC
  Administered 2017-06-12: 18:00:00 via INTRAVENOUS
  Filled 2017-06-12: qty 2760

## 2017-06-12 MED ORDER — LIDOCAINE HCL 1 % IJ SOLN
INTRAMUSCULAR | Status: AC
  Administered 2017-06-12: 09:00:00
  Filled 2017-06-12: qty 20

## 2017-06-12 MED ORDER — MAGNESIUM SULFATE 2 GM/50ML IV SOLN
2.0000 g | Freq: Once | INTRAVENOUS | Status: AC
  Administered 2017-06-12: 2 g via INTRAVENOUS
  Filled 2017-06-12: qty 50

## 2017-06-12 MED ORDER — FAT EMULSION 20 % IV EMUL
240.0000 mL | INTRAVENOUS | Status: AC
  Administered 2017-06-12: 240 mL via INTRAVENOUS
  Filled 2017-06-12: qty 250

## 2017-06-12 NOTE — Progress Notes (Signed)
Physical Therapy Treatment Patient Details Name: [name] [name] MRN: [id number] DOB: 27-Dec-1960 Today's Date: 06/12/2017    History of Present Illness  56 y.o. 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 On 7/18, he developed a leak therefore was taken back to OR for re-do of ex-lap and repair of leak on 05/20/17.  He returned to the ICU on the vent . Extubated on 05/28/17.    PT Comments    Co Tx with OT due to complexity.  Assisted pt OOB to amb a greater distance in hallway.  PCA x 2 used for ABD pain.  Unable to rate, positive grimacing.  Avg HR 102, RA 98%.  Returned to room then positioned to comfort in recliner.    Follow Up Recommendations  SNF;Supervision/Assistance - 24 hour     Equipment Recommendations  Rolling walker with 5" wheels    Recommendations for Other Services Rehab consult     Precautions / Restrictions Precautions Precautions: Fall Precaution Comments: multiple lines in abdomen, VAC, NG, and JP drain Restrictions Weight Bearing Restrictions: No    Mobility  Bed Mobility Overal bed mobility: Needs Assistance Bed Mobility: Supine to Sit     Supine to sit: Mod assist;+2 for physical assistance;+2 for safety/equipment     General bed mobility comments: assist for trunk upright, pt more awake/alert today and able to self assist better, +2 for lines  Transfers Overall transfer level: Needs assistance Equipment used: 2 person hand held assist Transfers: Sit to/from Stand Sit to Stand: Min assist;From elevated surface;+2 physical assistance;+2 safety/equipment         General transfer comment: elevated bed with 50% VC's on proper hand placement and increased time to rise      Mild c/o dizziness  Ambulation/Gait Ambulation/Gait assistance: Mod assist;+2 physical  assistance;+2 safety/equipment Ambulation Distance (Feet): 75 Feet Assistive device: Rolling walker (2 wheeled) Gait Pattern/deviations: Step-through pattern;Decreased stride length;Narrow base of support;Scissoring Gait velocity: decreased   General Gait Details: tolerated an increased distance with + 2 assist for safety (2 IV poles and recliner)    Stairs            Wheelchair Mobility    Modified Rankin (Stroke Patients Only)       Balance                                            Cognition Arousal/Alertness: Awake/alert Behavior During Therapy: WFL for tasks assessed/performed Overall Cognitive Status: Within Functional Limits for tasks assessed                                 General Comments: pleasant/cooperative      Exercises      General Comments        Pertinent Vitals/Pain Pain Assessment: Faces Faces Pain Scale: Hurts little more Pain Location: abdomen after activity Pain Descriptors / Indicators: Grimacing;Sore Pain Intervention(s): Monitored during session;Repositioned    Home Living                      Prior Function            PT Goals (current goals can now  be found in the care plan section) Progress towards PT goals: Progressing toward goals    Frequency    Min 3X/week      PT Plan Current plan remains appropriate    Co-evaluation PT/OT/SLP Co-Evaluation/Treatment: Yes Reason for Co-Treatment: Complexity of the patient's impairments (multi-system involvement);For patient/therapist safety;Other (comment) PT goals addressed during session: Mobility/safety with mobility        AM-PAC PT "6 Clicks" Daily Activity  Outcome Measure  Difficulty turning over in bed (including adjusting bedclothes, sheets and blankets)?: A Lot Difficulty moving from lying on back to sitting on the side of the bed? : A Lot Difficulty sitting down on and standing up from a chair with arms (e.g.,  wheelchair, bedside commode, etc,.)?: A Lot Help needed moving to and from a bed to chair (including a wheelchair)?: A Lot Help needed walking in hospital room?: A Lot Help needed climbing 3-5 steps with a railing? : Total 6 Click Score: 11    End of Session Equipment Utilized During Treatment: Gait belt Activity Tolerance: Patient tolerated treatment well Patient left: in chair;with call bell/phone within reach;with family/visitor present;with nursing/sitter in room Nurse Communication: Mobility status PT Visit Diagnosis: Difficulty in walking, not elsewhere classified (R26.2);Muscle weakness (generalized) (M62.81)     Time: 0100-7121 PT Time Calculation (min) (ACUTE ONLY): 35 min  Charges:  $Gait Training: 8-22 mins                     G Codes:       [name]  PTA [facility]  Acute  Rehab Pager      [phone]

## 2017-06-12 NOTE — Progress Notes (Signed)
Occupational Therapy Treatment and Goal Update Patient Details Name: [name] [name] MRN: [id number] DOB: Aug 25, 1961 Today's Date: 06/12/2017    History of present illness  56 y.o. 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 On 7/18, he developed a leak therefore was taken back to OR for re-do of ex-lap and repair of leak on 05/20/17.  He returned to the ICU on the vent . Extubated on 05/28/17.   OT comments  Pt agreeable to OOB and walk this am.  Cotx with PT due to complexity and multiple lines.  Updated goals this session  Follow Up Recommendations  SNF    Equipment Recommendations    3:1 commode   Recommendations for Other Services      Precautions / Restrictions Precautions Precautions: Fall Precaution Comments: multiple lines in abdomen, VAC, NG, and JP drain Restrictions Weight Bearing Restrictions: No       Mobility Bed Mobility Overal bed mobility: Needs Assistance Bed Mobility: Supine to Sit Rolling: Min guard (lines) Sidelying to sit: Mod assist;+2 for safety/equipment (lines)      General bed mobility comments: assist for trunk upright, pt more awake/alert today and able to self assist better, +2 for lines  Transfers Overall transfer level: Needs assistance Equipment used: Rolling walker (2 wheeled) Transfers: Sit to/from Stand Sit to Stand: Min assist;From elevated surface;+2 physical assistance;+2 safety/equipment         General transfer comment: vcs for hand placement and assist to rise and steady    Balance     Sitting balance-Leahy Scale: Fair                                     ADL either performed or assessed with clinical judgement   ADL                                         General ADL Comments: pt did not need to use  restroom during session.  Ambulated with mod A +2 safety for multiple lines and chair.  Pt tolerated well with VSS.     Vision       Perception     Praxis      Cognition Arousal/Alertness: Awake/alert Behavior During Therapy: WFL for tasks assessed/performed Overall Cognitive Status: Within Functional Limits for tasks assessed                                 General Comments: pleasant/cooperative        Exercises Exercises: Other exercises Other Exercises Other Exercises: performed one set of 10 AROM FF to 90 degrees while sitting EOB   Shoulder Instructions       General Comments      Pertinent Vitals/ Pain       Pain Assessment: Faces Faces Pain Scale: Hurts little more Pain Location: abdomen after activity Pain Descriptors / Indicators: Grimacing;Sore Pain Intervention(s): Limited activity within patient's tolerance;Monitored during session;Repositioned;PCA encouraged  Home Living  Prior Functioning/Environment              Frequency  Min 2X/week        Progress Toward Goals  OT Goals(current goals can now be found in the care plan section)  Progress towards OT goals:  (goals updated today)  Acute Rehab OT Goals OT Goal Formulation: With patient Time For Goal Achievement: 06/26/17 Potential to Achieve Goals: Good ADL Goals Pt Will Perform Grooming: with min guard assist;standing Pt Will Perform Upper Body Bathing:  (discontinue) Pt Will Perform Lower Body Bathing:  (discontinue) Pt Will Perform Upper Body Dressing:  (discontinue) Pt Will Perform Lower Body Dressing:  (discontinue) Pt Will Transfer to Toilet: with min assist;with +2 assist;bedside commode Pt Will Perform Toileting - Clothing Manipulation and hygiene: with mod assist;sit to/from stand (+2) Pt Will Perform Tub/Shower Transfer:  (discontinue) Additional ADL Goal #1: pt will perform bed mobility at min guard  level using bedrail as needed in preparation for toilet transfers Additional ADL Goal #2: Pt will sit EOB x 10 minutes and perform AROM or light adl activities to increase strength and endurance  Plan      Co-evaluation    PT/OT/SLP Co-Evaluation/Treatment: Yes Reason for Co-Treatment: Complexity of the patient's impairments (multi-system involvement);For patient/therapist safety PT goals addressed during session: Mobility/safety with mobility OT goals addressed during session: Strengthening/ROM      AM-PAC PT "6 Clicks" Daily Activity     Outcome Measure   Help from another person eating meals?: Total (NPO) Help from another person taking care of personal grooming?: A Little Help from another person toileting, which includes using toliet, bedpan, or urinal?: A Lot Help from another person bathing (including washing, rinsing, drying)?: A Lot Help from another person to put on and taking off regular upper body clothing?: A Lot Help from another person to put on and taking off regular lower body clothing?: Total 6 Click Score: 11    End of Session    OT Visit Diagnosis: Unsteadiness on feet (R26.81);Muscle weakness (generalized) (M62.81)   Activity Tolerance Patient tolerated treatment well   Patient Left in chair;with call bell/phone within reach;with bed alarm set   Nurse Communication          Time: 6301-6010 OT Time Calculation (min): 35 min  Charges: OT General Charges $OT Visit: 1 Procedure OT Treatments $Therapeutic Activity: 8-22 mins  [name], OTR/L [phone] 06/12/2017   [name] Mendolia 06/12/2017, 1:35 PM

## 2017-06-12 NOTE — Progress Notes (Signed)
Patient ID: [name] [name], male   DOB: August 13, 1961, 56 y.o.   MRN: [id number] 23 Days Post-Op   Subjective: No new events or problems overnight. Patient denies abdominal pain or other specific complaints this morning.  Objective: Vital signs in last 24 hours: Temp:  [98 F (36.7 C)-99.3 F (37.4 C)] 98 F (36.7 C) (08/17 0349) Pulse Rate:  [94-107] 94 (08/17 0800) Resp:  [16-25] 22 (08/17 0800) BP: (101-135)/(66-86) 107/68 (08/17 0800) SpO2:  [94 %-99 %] 94 % (08/17 0800) Last BM Date: 06/07/17  Intake/Output from previous day: 08/16 0701 - 08/17 0700 In: 3442.4 [I.V.:2507.4; Blood:345; NG/GT:260; IV Piggyback:300] Out: 2290 [Urine:1075; Emesis/NG output:500; Drains:715] Intake/Output this shift: No intake/output data recorded.  General appearance: fatigued and no distress Resp: clear to auscultation bilaterally GI: Nondistended. Soft and nontender. 2 Foley catheters in midline, lower with clear bilious drainage (distal bowel) and proximal with scant drainage. JP upper abdomen with minimal dark drainage. Incision/Wound: VAC in place right flank with scant apparent enteric drainage.  Lab Results:   Recent Labs  06/11/17 0804 06/11/17 2023 06/12/17 0607  WBC 12.2*  --  11.1*  HGB 7.7* 8.8* 8.8*  HCT 24.2* 27.0* 27.4*  PLT 503*  --  453*   BMET  Recent Labs  06/11/17 0526 06/12/17 0607  NA 142 142  K 3.4* 3.6  CL 101 106  CO2 34* 30  GLUCOSE 110* 98  BUN 24* 23*  CREATININE 0.87 0.76  CALCIUM 9.1 9.1     Studies/Results: No results found.  Anti-infectives: Anti-infectives    Start     Dose/Rate Route Frequency Ordered Stop   06/08/17 1200  piperacillin-tazobactam (ZOSYN) IVPB 3.375 g     3.375 g 12.5 mL/hr over 240 Minutes Intravenous Every 8 hours 06/08/17 1056     05/29/17 2200  ceFEPIme (MAXIPIME) 2 g in dextrose 5 % 50 mL IVPB  Status:  Discontinued     2 g 100 mL/hr over 30 Minutes Intravenous Every 8 hours 05/29/17 2031 06/03/17 0832   05/29/17  2100  metroNIDAZOLE (FLAGYL) IVPB 500 mg  Status:  Discontinued     500 mg 100 mL/hr over 60 Minutes Intravenous Every 8 hours 05/29/17 2029 06/03/17 0832   05/26/17 2200  ceFAZolin (ANCEF) IVPB 1 g/50 mL premix  Status:  Discontinued     1 g 100 mL/hr over 30 Minutes Intravenous Every 8 hours 05/26/17 1008 05/29/17 2023   05/25/17 1100  vancomycin (VANCOCIN) 1,250 mg in sodium chloride 0.9 % 250 mL IVPB  Status:  Discontinued     1,250 mg 166.7 mL/hr over 90 Minutes Intravenous Every 12 hours 05/25/17 0955 05/26/17 0946   05/22/17 1400  anidulafungin (ERAXIS) 100 mg in sodium chloride 0.9 % 100 mL IVPB     100 mg 78 mL/hr over 100 Minutes Intravenous Every 24 hours 05/21/17 1330 05/27/17 1740   05/21/17 1400  anidulafungin (ERAXIS) 200 mg in sodium chloride 0.9 % 200 mL IVPB     200 mg 78 mL/hr over 200 Minutes Intravenous  Once 05/21/17 1330 05/21/17 1940   05/15/17 1000  anidulafungin (ERAXIS) 100 mg in sodium chloride 0.9 % 100 mL IVPB  Status:  Discontinued     100 mg 78 mL/hr over 100 Minutes Intravenous Every 24 hours 05/14/17 0829 05/16/17 0901   05/15/17 1000  metroNIDAZOLE (FLAGYL) IVPB 500 mg  Status:  Discontinued     500 mg 100 mL/hr over 60 Minutes Intravenous Every 8 hours 05/15/17 0903 05/25/17 7494  05/14/17 0900  anidulafungin (ERAXIS) 200 mg in sodium chloride 0.9 % 200 mL IVPB     200 mg 78 mL/hr over 200 Minutes Intravenous  Once 05/14/17 0829 05/14/17 1325   05/13/17 1927  vancomycin (VANCOCIN) 1-5 GM/200ML-% IVPB    Comments:  Ward, Christa   : cabinet override      05/13/17 1927 05/14/17 0744   05/12/17 1400  ceFEPIme (MAXIPIME) 1 g in dextrose 5 % 50 mL IVPB     1 g 100 mL/hr over 30 Minutes Intravenous Every 8 hours 05/12/17 0939 05/26/17 2359   05/12/17 0900  vancomycin (VANCOCIN) IVPB 1000 mg/200 mL premix  Status:  Discontinued     1,000 mg 200 mL/hr over 60 Minutes Intravenous Every 12 hours 05/12/17 0750 05/16/17 0852   05/12/17 0830  aztreonam  (AZACTAM) 2 GM IVPB     2 g 100 mL/hr over 30 Minutes Intravenous  Once 05/12/17 0800 05/12/17 0957   05/06/17 0916  vancomycin (VANCOCIN) IVPB 1000 mg/200 mL premix     1,000 mg 200 mL/hr over 60 Minutes Intravenous On call to O.R. 05/06/17 0916 05/06/17 1229      Assessment/Plan: Multiple abdominal procedures following bowel injury during adrenalectomy Currently with distal ileum in discontinuity and catheter drainage of the 2 bowel ends. Now has new apparent enteric leak from a separate anastomosis communicating to the anterior abdomen and the VAC dressing. This appears to be draining through the wound and no evidence of sepsis. White blood count decreasing, patient stable and abdomen nontender.  No change in therapy currently. Discussed with patient and wife. Encourage participation in OT/PT. Continue antibiotics for now.  I think there is a good chance the current fistula will heal with bowel rest and wound care. Ultimately with a large tube in the ileum as currently I think we can work toward getting NG tube outer even start a small amount of enteric feeding. However would not do that currently with the recent leak. All questions answered.    LOS: 37 days    Krishay Faro T 06/12/2017

## 2017-06-12 NOTE — Progress Notes (Signed)
[place] NOTE   Pharmacy Consult for TPN Indication: prolonged ileus, small bowel leak, multiple bowel surgeries  Patient Measurements: Body mass index is 25.37 kg/m. Filed Weights   06/09/17 0500 06/10/17 0600 06/11/17 0700  Weight: 175 lb 4.3 oz (79.5 kg) 168 lb 10.4 oz (76.5 kg) 176 lb 12.9 oz (80.2 kg)   HPI: [name] [name] admitted on 7/11 for enlarging adrenal mass and planned adrenalectomy.  Pharmacy consulted to dose TPN.  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   7/22 OR:  wash out, tube ileostomy, left with open abdomen & wound vac in place.  Plan for OR on 7/25 for closure 7/25 OR: mesh placed for abdominal closure, wound vac 8/2 extubated, propofol drip off 8/4 TPN off for ~ 5 hours due to line detached from filter 8/10 MD request decrease rate x 1 week 2nd low sodium 8/12 Additional sodium added to TPN due to persistent hyponatremia 8/16 Increasing rate back to goal since sodium has improved with above intervention.    Insulin requirements past 24 hours: 3 units SSI (no hx DM)  Current Nutrition: NPO, ice chips  IVF: D5 1/2NS at10 ml/hr  Central access: PICC line ordered 7/19, placed on 7/20 TPN start date: 7/20  ASSESSMENT                                                                                                           Today, 06/12/17  - Glucose: at goal (goal < 150) - Electrolytes:  Na now normal at 142 with the addition of sodium to TPN: now with a total of 127 meq Na/L - standard TPN has ~ 70 meq/L  Phos now WNL, CoCa 10.3, CaxPhos = ~43  goal < 55    K 3.6 (WNL)  Mag low at 1.4 - Repleted per MD - Renal:  SCr wnl, stable. I/O: +1001,   NGO 45 - Weight:80.2,  Was 93.3kg on 7/31, 89.4kg on hospital admission - GI: VAC removed, drains in - LFTs: WNL x alk phos 174.   Tbili WNL  - TGs:  296 (7/18), 266 (7/20), 269 (7/23), 210 (7/28), 285 (7/31) 301 (8/3), increased 323 on 814  - Prealbumin:  <5 (7/20), 6.1 (7/24), 10.1 (7/30) 24.8 (8/6), now normal 26.4 (8/13)  NUTRITIONAL GOALS  RD recs (8/10):  122-138 gms Protein (1.5 - 1.7 g/kg) 2030 - 2270 Kcal  Clinimix  E 5/15 at a max fluid volume of 3L at 120 ml/hr  + 20% fat emulsion 240 ml/day to provide: 144 g of protein and 2568 kCals per day - Glucose infusion rate will be 3.2 mg/kg/min (Maximum 5 mg/kg/min)  Clinimix 5/20 (no electrolytes) at rate of 115 ml/hr with lipids 3x/week on MWF only would provide: 138g protein and avg 2174 kcal/day - Glucose infusion rate will be 4.11 mg/kg/min (Maximum 5 mg/kg/min)   PLAN                                                                                                                         At 1800 today: ? Change to Clinimix E 5/20 with improvement in phosphorus product at 115 ml/hr.  Will not add additional sodium to TPN (E formulation includes 70 mEq of Na)  Lipids at 23ms/hr x 12 hours 3 times weekly on MWF  TPN to contain standard multivitamins and trace elements daily  [place] S[place] q6h.  TPN lab panels on Mondays & Thursdays.  Check BMET, Mag & Phos in AM  MNetta Cedars PharmD, BCPS Pager: [phone]/17/2018 9:04 AM

## 2017-06-12 NOTE — Progress Notes (Signed)
[facility] Consultation Progress Note  Patient: [name] [name]:[id number]   PCP: Lujean Amel, MD DOB: Aug 22, 1961   DOA: 05/06/2017   DOS: 06/12/2017   Date of Service: the patient was seen and examined on 06/12/2017 Primary service: [name], Arta Bruce, MD   Subjective: Dizziness is better. Has increased cough. No nausea no vomiting.  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 Patient's course was complicated by small bowel injury requiring resection with anastomoses and placement of wound VAC. On 7/18, patient developed leak and was taken back to the operating room for redo of his exploratory laparotomy and repair of leak. That time, patient returned to the ICU on the ventilator. Critical care was consulted for ventilator/medical management.  Over the next 2 weeks, patient's hospital course was complicated with the following: 07/19 - NGT pulled out and almost self-extubated. Developed cuff leak >> tube exchanged. Back to OR emergently for intestinal leak/ischemic bowel.TPN started at that time 07/22 - Back to OR for wash out >> left with open abdomen & wound vac in place. Transfused 1u PRBC. 07/25 - rising temp, WBC increased. Returned to OR for closure 07/27 - Recultured, sedation down on precedex [facility] 7/27 >> MRSE 1/2 > contaminant ? 07/30 - Remains on sedation, VAC leak over the weekend with dressing change by [facility] 07/31 - Initial attempt at weaning failed to due to tachypnea/anxiety 08/02 - Fever 101.1, WBC 14.2, weaned / extubated 08/03 - O2 weaned to 1L. Remains on dilaudid, initially on gtt now PCA. RN reports precedex started overnight for anxiety. 08/04 -  Ultrasound-guided aspiration By interventional radiology of small perihepatic fluid collection yielded only 1 mL of the fluid, sent for culture. No growth yet 08/06 - patient improving. Off Precedex drip since 8/4. Still on TPN 08/08 - wound vac changed per surgery. WBC 16. Abx d/c per surgery  08/13 - Patient with increased tachycardia and developed a fever.  Wound VAC noted with feculent material.  Started on IV Zosyn 08/14 - CT abdomen pelvis shows extraluminal stool in the right lower quadrant, surgery expects conservative management.  Currently further plan is continue IV antibiotics transition to oral when stable.  Assessment and Plan: 1. Sepsis- resolved now due to peritonitis and MSSA HCAP.   white count has been overall stable in the last couple of weeks, and antibiotics were discontinued by general surgery on 8/8.   Zosyn had to be restarted however on 8/13 due to concern for recurrent sepsis given fever and elevated heart rate. Heart rate as well as leukocytosis getting better. Patient primarily have leaking bowel, will defer to surgery for further management for definitive treatment, currently plan is conservative management with frequent wound VAC dressing changes.  At present we'll continue IV antibiotics for a total of 7-10 days.  2. Peritonitis/intestinal perforation/anastomotic leak - 05/29/2017 CT abdomen and pelvis thick walled fluid collection along anterior and lateral aspects of the liver; extends 20 cm along anterior and lateral margins of liver.   Interventional radiology was consulted and on 05/30/2017 he underwent aspiration.  Cultures have remained negative. 05/20/17--ABDOMINAL WOUND [place] OUT, WOUND CLOSURE AND WOUND VAC PLACEMENT.  NG remains in place, patient will need to be on TPN for quite  some time per general surgery  3. Acute respiratory failure with hypoxia -resolved Secondary to sepsis and HCAP post op adrenalectomy.  Currently he is stable on nasal cannula.   He was intubated and has been extubated since 05/28/2017.  Continue DuoNeb's. -Wean off oxygen as tolerated  4. Sinus tachycardia -ongoing, likely related to peritonitis -2D echo obtained 8/13 showed an EF of 55-60% and grade 1 diastolic dysfunction  5. Acute postoperative blood loss anemia. Hemoglobin slowly trending downwards over last few days, initial drop was due to postoperative blood loss anemia. Current downward trend is most likely dilutional in the setting of receiving TPN as the patient is a 11 L positive. Patient is getting one PRBC due to being symptomatic. Will monitor.  There is some concerns for coffee-ground return in his NG tube therefore he is on SCDs and his Lovenox is on hold. On Protonix injectable twice a day as well as Pepcid added today.  6. Hyponatremia, hypomagnesemia, hypokalemia, hyperphosphatemia -Suspect due to TPN, -TSH normal - Daily monitoring and correction  7 MSSA HCAP  finished 7 days of cefepime, afebrile, stable respiratory status Continue flutter device as well as incentive spirometer.  8. R-adrenal mass/pheochromocytoma  -s/p R-adrenalectomy, surgery was complicated by small bowel injury in the setting of adhesions with sepsis secondary to intra-abdominal infection Blood pressure remained stable after the surgery.  9. Acute metabolic encephalopathy -multifactorial including sepsis, infection, electrolyte derangement, opioids/benzos.  Stable  10 Thrombocytosis -likely acute phase reactant and possible iron deficiency -pt had normal platelet count at time of admission  11. Essential Hypertension -remains stable off home clonidine, labetalol, chlorthalidone, valsartan  12. Chronic pain syndrome/Post-op pain -had significant post-op pain -remains on dilaudid PCA, po oxycodone scheduled, ativan scheduled -pain management per [facility] -previously on home gaba, percocet  13. Hyperlipidemia -remain off lipitor until able to tolerate po  reliably  14 Tobacco abuse -nicoderm patch  15 Hyperglycemia -likely stress induced and TPN -A1C was 5.6, not a diabetic -continue novolog sliding scale  Diet: NPO on TPN DVT Prophylaxis: SCD  Family Communication: family was present at bedside, at the time of interview. The pt provided permission to discuss medical plan with the family. Opportunity was given to ask question and all questions were answered satisfactorily.   Disposition: We will continue to follow the patient.   Recommendation on discharge: To be determined.   Other Consultants: Wound care, critical care Procedures: Echocardiogram, rest per progress note Antibiotics: Anti-infectives    Start     Dose/Rate Route Frequency Ordered Stop   06/08/17 1200  piperacillin-tazobactam (ZOSYN) IVPB 3.375 g     3.375 g 12.5 mL/hr over 240 Minutes Intravenous Every 8 hours 06/08/17 1056     05/29/17 2200  ceFEPIme (MAXIPIME) 2 g in dextrose 5 % 50 mL IVPB  Status:  Discontinued     2 g 100 mL/hr over 30 Minutes Intravenous Every 8 hours 05/29/17 2031 06/03/17 0832   05/29/17 2100  metroNIDAZOLE (FLAGYL) IVPB 500 mg  Status:  Discontinued     500 mg 100 mL/hr over 60 Minutes Intravenous Every 8 hours 05/29/17 2029 06/03/17 0832   05/26/17 2200  ceFAZolin (ANCEF) IVPB 1 g/50 mL premix  Status:  Discontinued     1 g 100 mL/hr over 30 Minutes Intravenous Every 8 hours 05/26/17 1008 05/29/17 2023   05/25/17 1100  vancomycin (VANCOCIN) 1,250 mg in sodium chloride 0.9 % 250 mL IVPB  Status:  Discontinued     1,250 mg 166.7 mL/hr  over 90 Minutes Intravenous Every 12 hours 05/25/17 0955 05/26/17 0946   05/22/17 1400  anidulafungin (ERAXIS) 100 mg in sodium chloride 0.9 % 100 mL IVPB     100 mg 78 mL/hr over 100 Minutes Intravenous Every 24 hours 05/21/17 1330 05/27/17 1740   05/21/17 1400  anidulafungin (ERAXIS) 200 mg in sodium chloride 0.9 % 200 mL IVPB     200 mg 78 mL/hr over 200 Minutes Intravenous  Once 05/21/17 1330  05/21/17 1940   05/15/17 1000  anidulafungin (ERAXIS) 100 mg in sodium chloride 0.9 % 100 mL IVPB  Status:  Discontinued     100 mg 78 mL/hr over 100 Minutes Intravenous Every 24 hours 05/14/17 0829 05/16/17 0901   05/15/17 1000  metroNIDAZOLE (FLAGYL) IVPB 500 mg  Status:  Discontinued     500 mg 100 mL/hr over 60 Minutes Intravenous Every 8 hours 05/15/17 0903 05/25/17 0938   05/14/17 0900  anidulafungin (ERAXIS) 200 mg in sodium chloride 0.9 % 200 mL IVPB     200 mg 78 mL/hr over 200 Minutes Intravenous  Once 05/14/17 0829 05/14/17 1325   05/13/17 1927  vancomycin (VANCOCIN) 1-5 GM/200ML-% IVPB    Comments:  Ward, Christa   : cabinet override      05/13/17 1927 05/14/17 0744   05/12/17 1400  ceFEPIme (MAXIPIME) 1 g in dextrose 5 % 50 mL IVPB     1 g 100 mL/hr over 30 Minutes Intravenous Every 8 hours 05/12/17 0939 05/26/17 2359   05/12/17 0900  vancomycin (VANCOCIN) IVPB 1000 mg/200 mL premix  Status:  Discontinued     1,000 mg 200 mL/hr over 60 Minutes Intravenous Every 12 hours 05/12/17 0750 05/16/17 0852   05/12/17 0830  aztreonam (AZACTAM) 2 GM IVPB     2 g 100 mL/hr over 30 Minutes Intravenous  Once 05/12/17 0800 05/12/17 0957   05/06/17 0916  vancomycin (VANCOCIN) IVPB 1000 mg/200 mL premix     1,000 mg 200 mL/hr over 60 Minutes Intravenous On call to O.R. 05/06/17 0916 05/06/17 1229      Objective: Physical Exam: Vitals:   06/12/17 1258 06/12/17 1300 06/12/17 1400 06/12/17 1500  BP:   103/67   Pulse:  (!) 101 99 (!) 104  Resp: (!) 22  (!) 23 19  Temp:      TempSrc:      SpO2: 94% 95% 95% 96%  Weight:      Height:        Intake/Output Summary (Last 24 hours) at 06/12/17 1510 Last data filed at 06/12/17 1500  Gross per 24 hour  Intake          4787.44 ml  Output             1565 ml  Net          3222.44 ml   Filed Weights   06/09/17 0500 06/10/17 0600 06/11/17 0700  Weight: 79.5 kg (175 lb 4.3 oz) 76.5 kg (168 lb 10.4 oz) 80.2 kg (176 lb 12.9 oz)   General:  Alert, Awake and Oriented to Time, Place and Person. Appear in moderate distress, affect flat Eyes: PERRL, Conjunctiva normal ENT: Oral Mucosa clear moist. Neck: difficult to assess JVD, no Abnormal Mass Or lumps Cardiovascular: S1 and S2 Present, no Murmur, Peripheral Pulses Present Respiratory: normal respiratory effort, Bilateral Air entry equal and Decreased, no use of accessory muscle, Clear to Auscultation, no Crackles, no wheezes Abdomen: Bowel Sound present, Soft and diffuse tenderness, large open abdominal  wound seen with woundcare nurse. Extremities: trace Pedal edema, no calf tenderness Neurologic: Grossly no focal neuro deficit. Bilaterally Equal motor strength   Data Reviewed: CBC:  Recent Labs Lab 06/08/17 0525 06/09/17 0600 06/10/17 0603 06/11/17 0804 06/11/17 2023 06/12/17 0607  WBC 15.4* 13.8* 13.0* 12.2*  --  11.1*  NEUTROABS 8.9*  --   --  7.1  --   --   HGB 9.3* 8.4* 8.1* 7.7* 8.8* 8.8*  HCT 28.0* 26.6* 25.1* 24.2* 27.0* 27.4*  MCV 79.5 79.4 79.7 80.9  --  79.9  PLT 635* 560* 529* 503*  --  389*   Basic Metabolic Panel:  Recent Labs Lab 06/07/17 0658 06/08/17 0525 06/09/17 0600 06/10/17 0603 06/10/17 0616 06/11/17 0526 06/12/17 0607  NA 129* 132* 136 138  --  142 142  K 4.4 4.4 3.9 3.5  --  3.4* 3.6  CL 93* 95* 94* 97*  --  101 106  CO2 23 27 30  32  --  34* 30  GLUCOSE 127* 122* 150* 121*  --  110* 98  BUN 35* 36* 32* 26*  --  24* 23*  CREATININE 0.86 0.95 0.98 0.95  --  0.87 0.76  CALCIUM 9.7 9.5 9.3 9.1  --  9.1 9.1  MG 1.9 1.7  --   --  1.6* 1.8 1.4*  PHOS 6.5* 5.8* 5.7* 4.9*  --  5.0* 4.2   Liver Function Tests:  Recent Labs Lab 06/06/17 1159 06/08/17 0525 06/10/17 0603 06/11/17 0526 06/12/17 0607  AST 35 32 23 27 23   ALT 31 27 21 21 18   ALKPHOS 259* 212* 174* 165* 145*  BILITOT 1.0 0.8 0.6 0.6 0.8  PROT 8.4* 7.8 7.1 7.0 6.8  ALBUMIN 2.9* 2.8* 2.5* 2.4* 2.4*   No results for input(s): LIPASE, AMYLASE in the last 168 hours. No  results for input(s): AMMONIA in the last 168 hours. Cardiac Enzymes: No results for input(s): CKTOTAL, CKMB, CKMBINDEX, TROPONINI in the last 168 hours. BNP (last 3 results) No results for input(s): BNP in the last 8760 hours. CBG:  Recent Labs Lab 06/11/17 1315 06/11/17 1711 06/11/17 2307 06/12/17 0620 06/12/17 1251  GLUCAP 120* 121* 131* 94 122*   Recent Results (from the past 240 hour(s))  MRSA PCR Screening     Status: None   Collection Time: 06/11/17  5:59 PM  Result Value Ref Range Status   MRSA by PCR NEGATIVE NEGATIVE Final    Comment:        The GeneXpert MRSA Assay (FDA approved for NASAL specimens only), is one component of a comprehensive MRSA colonization surveillance program. It is not intended to diagnose MRSA infection nor to guide or monitor treatment for MRSA infections.     Studies: No results found.   Scheduled Meds: . chlorhexidine  15 mL Mouth Rinse BID  . Chlorhexidine Gluconate Cloth  6 each Topical Q1200  . HYDROmorphone   Intravenous Q4H  . insulin aspart  0-15 Units Subcutaneous Q6H  . ipratropium-albuterol  3 mL Nebulization BID  . lip balm  1 application Topical BID  . LORazepam  2 mg Oral Q6H  . mouth rinse  15 mL Mouth Rinse q12n4p  . nicotine  21 mg Transdermal Daily  . oxyCODONE  10 mg Per Tube Q4H  . pantoprazole (PROTONIX) IV  40 mg Intravenous Q12H  . thiamine injection  100 mg Intravenous Daily   Continuous Infusions: . chlorproMAZINE (THORAZINE) IV Stopped (06/09/17 1649)  . dextrose 5 % and 0.45%  NaCl 10 mL/hr at 06/12/17 1500  . famotidine (PEPCID) IV Stopped (06/12/17 0936)  . Marland KitchenTPN (CLINIMIX-E) Adult     And  . fat emulsion    . piperacillin-tazobactam (ZOSYN)  IV Stopped (06/12/17 1653)  . TPN (CLINIMIX) Adult without lytes 115 mL/hr at 06/12/17 1500   PRN Meds: acetaminophen, chlorproMAZINE (THORAZINE) IV, diphenhydrAMINE, HYDROmorphone, iopamidol, labetalol, LORazepam, magic mouthwash, menthol-cetylpyridinium,  naloxone **AND** sodium chloride flush, ondansetron **OR** ondansetron (ZOFRAN) IV, phenol, sodium chloride flush  Time spent: 45 minutes  Author: Berle Mull, MD [facility] Pager: [phone] 06/12/2017 3:10 PM  If 7PM-7AM, please contact night-coverage at [email or web address], password [facility]

## 2017-06-12 NOTE — Progress Notes (Signed)
Nutrition Follow-up  INTERVENTION:   TPN per Pharmacy Will continue to monitor plan  NUTRITION DIAGNOSIS:   Inadequate oral intake related to inability to eat as evidenced by NPO status.  Ongoing.  GOAL:   Patient will meet greater than or equal to 90% of their needs  Meeting now with TPN.  MONITOR:   Weight trends, Labs, Skin, I & O's, Other (Comment) (TPN regimen)  ASSESSMENT:   56 y.o. 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 On 7/18, he developed a leak therefore was taken back to OR for re-do of ex-lap and repair of leak. 7/11 PY:KDXIP adrenalectomy with complication of intestinal injury and small bowel resection with anastomosis  7/18 JA:[id number] of small bowel anastomotic leak, abdominal closure of wound dehisence 7/19 OR: reopening of recent laparotomy, lysis of adhesions, noted ischemic perforation of new loop of intestine, intact repair of previous anastomotic leak repair, intact previous anastomosis.  7/22 OR: wash out, tube ileostomy, left with open abdomen &wound vac in place. Plan for OR on 7/25 for closure 7/25 OR: mesh placed for abdominal closure, wound vac 8/2 extubated, propofol drip off 8/4  TPN off for ~ 5 hours due to line detached from filter. Ultrasound-guided aspiration By interventional radiology of small perihepatic fluid collection yielded only 1 mL of the fluid, sent for culture. NPO - mild coffee ground on NG but no active bleed 8/6 patient improving. Off Precedex drip since 8/4. Dilaudid Drip being changed over to PCA. Still with some coffee ground-looking material in NG tube.  8/10  hyponatremia and plan to decrease TPN from 3L/day to 2L/day x1 week 8/13  found to have fecal matter in wound vac 8/14  increase in fecal matter presence in wound vac with no plan  for surgery at this time 8/16 TPN advanced to goal rate of 115 ml/hr (2174 kcal, 138g protein)  Patient with enteric leak. Bowel rest to continue per surgery note. Still with NGT, last output recorded: 350 ml.  Medications: IV Protonix BID, IV Thiamine daily  Labs reviewed: Low Mg Phos WNL  Plan per Pharmacy 8/17: At 1800 today: ? Change to Clinimix E 5/20 with improvement in phosphorus product at 115 ml/hr.  Will not add additional sodium to TPN (E formulation includes 70 mEq of Na)  Lipids at 93mls/hr x 12 hours 3 times weekly on MWF  Diet Order:  Diet NPO time specified Except for: Ice Chips TPN (CLINIMIX) Adult without lytes TPN (CLINIMIX-E) Adult  Skin:  Wound (see comment) (Incisions to R flank and abdomen from 7/11 and 7/18)  Last BM:  8/12 via ileostomy  Height:   Ht Readings from Last 1 Encounters:  05/26/17 5\' 10"  (1.778 m)    Weight:   Wt Readings from Last 1 Encounters:  06/11/17 176 lb 12.9 oz (80.2 kg)    Ideal Body Weight:  75.45 kg  BMI:  Body mass index is 25.37 kg/m.  Estimated Nutritional Needs:   Kcal:  2030-2270 (25-28 kcal/kg)  Protein:  122-138 grams (1.5-1.7 grams/kg)  Fluid:  2 L/day  EDUCATION NEEDS:   No education needs identified at this time  Clayton Bibles, MS, RD, LDN Pager: [phone] After Hours Pager: [phone]

## 2017-06-13 LAB — BASIC METABOLIC PANEL
Anion gap: 8 (ref 5–15)
BUN: 22 mg/dL — AB (ref 6–20)
CALCIUM: 8.8 mg/dL — AB (ref 8.9–10.3)
CO2: 26 mmol/L (ref 22–32)
CREATININE: 0.69 mg/dL (ref 0.61–1.24)
Chloride: 106 mmol/L (ref 101–111)
GFR calc Af Amer: >60 mL/min (ref >60)
GLUCOSE: 126 mg/dL — AB (ref 65–99)
Potassium: 3.3 mmol/L — ABNORMAL LOW (ref 3.5–5.1)
Sodium: 140 mmol/L (ref 135–145)

## 2017-06-13 LAB — GLUCOSE, CAPILLARY
GLUCOSE-CAPILLARY: 115 mg/dL — AB (ref 65–99)
GLUCOSE-CAPILLARY: 129 mg/dL — AB (ref 65–99)
GLUCOSE-CAPILLARY: 115 mg/dL — AB (ref 65–99)
Glucose-Capillary: 115 mg/dL — ABNORMAL HIGH (ref 65–99)
Glucose-Capillary: 121 mg/dL — ABNORMAL HIGH (ref 65–99)

## 2017-06-13 LAB — PHOSPHORUS: Phosphorus: 5 mg/dL — ABNORMAL HIGH (ref 2.5–4.6)

## 2017-06-13 LAB — CBC
HCT: 25.5 % — ABNORMAL LOW (ref 39.0–52.0)
Hemoglobin: 8.2 g/dL — ABNORMAL LOW (ref 13.0–17.0)
MCH: 27.4 pg (ref 26.0–34.0)
MCHC: 32.2 g/dL (ref 30.0–36.0)
MCV: 85.3 fL (ref 78.0–100.0)
PLATELETS: 430 10*3/uL — AB (ref 150–400)
RBC: 2.99 MIL/uL — ABNORMAL LOW (ref 4.22–5.81)
RDW: 17.8 % — ABNORMAL HIGH (ref 11.5–15.5)
WBC: 10.2 10*3/uL (ref 4.0–10.5)

## 2017-06-13 LAB — MAGNESIUM: Magnesium: 1.6 mg/dL — ABNORMAL LOW (ref 1.7–2.4)

## 2017-06-13 MED ORDER — POTASSIUM CHLORIDE 10 MEQ/100ML IV SOLN
10.0000 meq | INTRAVENOUS | Status: AC
  Administered 2017-06-13 (×3): 10 meq via INTRAVENOUS
  Filled 2017-06-13 (×3): qty 100

## 2017-06-13 MED ORDER — MAGNESIUM SULFATE 2 GM/50ML IV SOLN
2.0000 g | Freq: Once | INTRAVENOUS | Status: AC
  Administered 2017-06-13: 2 g via INTRAVENOUS
  Filled 2017-06-13: qty 50

## 2017-06-13 MED ORDER — BUTALBITAL-APAP-CAFFEINE 50-325-40 MG PO TABS
1.0000 | ORAL_TABLET | Freq: Once | ORAL | Status: AC
  Administered 2017-06-13: 1
  Filled 2017-06-13: qty 1

## 2017-06-13 MED ORDER — TRACE MINERALS CR-CU-MN-SE-ZN 10-1000-500-60 MCG/ML IV SOLN
INTRAVENOUS | Status: AC
  Administered 2017-06-13: 19:00:00 via INTRAVENOUS
  Filled 2017-06-13: qty 1000

## 2017-06-13 MED ORDER — BUTALBITAL-APAP-CAFFEINE 50-325-40 MG PO TABS: 1.0000 | ORAL_TABLET | Freq: Once | ORAL | Status: DC

## 2017-06-13 MED ORDER — BUTALBITAL-APAP-CAFFEINE 50-325-40 MG PO TABS
1.0000 | ORAL_TABLET | Freq: Four times a day (QID) | ORAL | Status: DC | PRN
  Administered 2017-06-14 – 2017-06-16 (×5): 1
  Filled 2017-06-13 (×7): qty 1

## 2017-06-13 NOTE — Progress Notes (Signed)
[facility]:  [phone] General Surgery Progress Note   LOS: 38 days  POD -  24 Days Post-Op  Chief Complaint: Abdominal pain  Assessment and Plan: 1.  OPEN RIGHT ADRENALECTOMY WITH SMALL BOWEL RESECTION AND ANASTOMOSIS - 05/06/2017 - [name]  Additional surgery - 05/13/2017, 05/14/2017, 05/17/2017 and 05/20/2017  Currently with distal ileum in discontinuity and catheter drainage of the 2 bowel ends. Now has new apparent enteric leak from a separate anastomosis communicating to the anterior abdomen and the VAC dressing.   Zosyn- 06/07/2017 >>>  2.  Malnutrition  On TPN (also for bowel rest)  3.  HTN 4.  COPD/Smokes 5.  Anemia  Hgb - 8.2 - 06/13/2017 6.  On chronic pain meds             Takes about 40 mg oxycodone per day for neck - goes to pain clinic - sees Dr. [name] for neck 7.  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  He had a headache last PM.  Better today.  Wife at bedside.  Objective:   Vitals:   06/13/17 0606 06/13/17 0800  BP:    Pulse:    Resp:    Temp: 98.3 F (36.8 C) 98.2 F (36.8 C)  SpO2:       Intake/Output from previous day:  08/17 0701 - 08/18 0700 In: 3039.8 [I.V.:2939.8; IV Piggyback:100] Out: 2818 [Urine:875; Emesis/NG output:1250; Drains:693]  Intake/Output this shift:  No intake/output data  recorded.   Physical Exam:   General: AA M who is alert and oriented.    HEENT: Normal. Pupils equal.  Has NGT .   Lungs: Clear - though he is spitting up some phlegm   Abdomen: RUQ VAC.  Looks like two drains in small bowel.  Right lateral abdominal drain.  Drain 1/2/bulb - 23/500/30 cc last 24 hours   Lab Results:    Recent Labs  06/12/17 0607 06/13/17 0430  WBC 11.1* 10.2  HGB 8.8* 8.2*  HCT 27.4* 25.5*  PLT 453* 430*    BMET   Recent Labs  06/12/17 0607 06/13/17 0430  NA 142 140  K 3.6 3.3*  CL 106 106  CO2 30 26  GLUCOSE 98 126*  BUN 23* 22*  CREATININE 0.76 0.69  CALCIUM 9.1 8.8*    PT/INR  No results for input(s): LABPROT, INR in the last 72 hours.  ABG  No results for input(s): PHART, HCO3 in the last 72 hours.  Invalid input(s): PCO2, PO2   Studies/Results:  No results found.   Anti-infectives:   Anti-infectives    Start     Dose/Rate Route Frequency Ordered Stop   06/08/17 1200  piperacillin-tazobactam (ZOSYN) IVPB 3.375 g     3.375 g 12.5 mL/hr over 240 Minutes Intravenous Every 8 hours 06/08/17 1056  05/29/17 2200  ceFEPIme (MAXIPIME) 2 g in dextrose 5 % 50 mL IVPB  Status:  Discontinued     2 g 100 mL/hr over 30 Minutes Intravenous Every 8 hours 05/29/17 2031 06/03/17 0832   05/29/17 2100  metroNIDAZOLE (FLAGYL) IVPB 500 mg  Status:  Discontinued     500 mg 100 mL/hr over 60 Minutes Intravenous Every 8 hours 05/29/17 2029 06/03/17 0832   05/26/17 2200  ceFAZolin (ANCEF) IVPB 1 g/50 mL premix  Status:  Discontinued     1 g 100 mL/hr over 30 Minutes Intravenous Every 8 hours 05/26/17 1008 05/29/17 2023   05/25/17 1100  vancomycin (VANCOCIN) 1,250 mg in sodium chloride 0.9 % 250 mL IVPB  Status:  Discontinued     1,250 mg 166.7 mL/hr over 90 Minutes Intravenous Every 12 hours 05/25/17 0955 05/26/17 0946   05/22/17 1400  anidulafungin (ERAXIS) 100 mg in sodium chloride 0.9 % 100 mL IVPB     100 mg 78 mL/hr over 100 Minutes Intravenous Every  24 hours 05/21/17 1330 05/27/17 1740   05/21/17 1400  anidulafungin (ERAXIS) 200 mg in sodium chloride 0.9 % 200 mL IVPB     200 mg 78 mL/hr over 200 Minutes Intravenous  Once 05/21/17 1330 05/21/17 1940   05/15/17 1000  anidulafungin (ERAXIS) 100 mg in sodium chloride 0.9 % 100 mL IVPB  Status:  Discontinued     100 mg 78 mL/hr over 100 Minutes Intravenous Every 24 hours 05/14/17 0829 05/16/17 0901   05/15/17 1000  metroNIDAZOLE (FLAGYL) IVPB 500 mg  Status:  Discontinued     500 mg 100 mL/hr over 60 Minutes Intravenous Every 8 hours 05/15/17 0903 05/25/17 0938   05/14/17 0900  anidulafungin (ERAXIS) 200 mg in sodium chloride 0.9 % 200 mL IVPB     200 mg 78 mL/hr over 200 Minutes Intravenous  Once 05/14/17 0829 05/14/17 1325   05/13/17 1927  vancomycin (VANCOCIN) 1-5 GM/200ML-% IVPB    Comments:  Ward, Christa   : cabinet override      05/13/17 1927 05/14/17 0744   05/12/17 1400  ceFEPIme (MAXIPIME) 1 g in dextrose 5 % 50 mL IVPB     1 g 100 mL/hr over 30 Minutes Intravenous Every 8 hours 05/12/17 0939 05/26/17 2359   05/12/17 0900  vancomycin (VANCOCIN) IVPB 1000 mg/200 mL premix  Status:  Discontinued     1,000 mg 200 mL/hr over 60 Minutes Intravenous Every 12 hours 05/12/17 0750 05/16/17 0852   05/12/17 0830  aztreonam (AZACTAM) 2 GM IVPB     2 g 100 mL/hr over 30 Minutes Intravenous  Once 05/12/17 0800 05/12/17 0957   05/06/17 0916  vancomycin (VANCOCIN) IVPB 1000 mg/200 mL premix     1,000 mg 200 mL/hr over 60 Minutes Intravenous On call to O.R. 05/06/17 0916 05/06/17 1229      Alphonsa Overall, MD, FACS Pager: [facility]: [phone] 06/13/2017

## 2017-06-13 NOTE — Progress Notes (Signed)
CBG at 0000 121.  Would not cross over from machine to epic.  Confirmed with tech [employer].

## 2017-06-13 NOTE — Progress Notes (Signed)
[place] NOTE   Pharmacy Consult for TPN Indication: prolonged ileus, small bowel leak, multiple bowel surgeries  Patient Measurements: Body mass index is 25.94 kg/m. Filed Weights   06/10/17 0600 06/11/17 0700 06/13/17 0606  Weight: 168 lb 10.4 oz (76.5 kg) 176 lb 12.9 oz (80.2 kg) 180 lb 12.4 oz (82 kg)   HPI: 54 yoM admitted on 7/11 for enlarging adrenal mass and planned adrenalectomy.  Pharmacy consulted to dose TPN.  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   7/22 OR:  wash out, tube ileostomy, left with open abdomen & wound vac in place.  Plan for OR on 7/25 for closure 7/25 OR: mesh placed for abdominal closure, wound vac 8/2 extubated, propofol drip off 8/4 TPN off for ~ 5 hours due to line detached from filter 8/10 MD request decrease rate x 1 week 2nd low sodium 8/12 Additional sodium added to TPN due to persistent hyponatremia 8/16 Increasing rate back to goal since sodium has improved with above intervention.    Insulin requirements past 24 hours: 4 units SSI (no hx DM)  Current Nutrition: NPO, ice chips  IVF: D5 1/2NS at10 ml/hr  Central access: PICC line ordered 7/19, placed on 7/20 TPN start date: 7/20  ASSESSMENT                                                                                                           Today, 06/13/17  - Glucose: at goal (goal < 150) - Electrolytes:  Na now normal at 140 with standard formulation TPN: now with a total of ~ 70 meq Na/L  Phos increasing again, CoCa 10.08, CaxPhos = ~50  goal < 55   K 3.3  (decreased)  Mag 1.6 (up from 1.4 yesterday after 2g Mag sulfate per MD) - Renal:  SCr wnl, stable. I/O: +221,   NGO 10 - [id number],  Was 93.3kg on 7/31, 89.4kg on hospital admission - GI: VAC removed, drains in - LFTs: WNL x alk phos 174.   Tbili WNL  - TGs:  296 (7/18), 266 (7/20), 269 (7/23), 210 (7/28), 285 (7/31) 301 (8/3), increased 323 on 8/14  - Prealbumin:  <5 (7/20), 6.1 (7/24), 10.1 (7/30) 24.8 (8/6), now normal 26.4 (8/13)  NUTRITIONAL GOALS  RD recs (8/10):  122-138 gms Protein (1.5 - 1.7 g/kg) 2030 - 2270 Kcal  Clinimix  E 5/15 at a max fluid volume of 3L at 120 ml/hr  + 20% fat emulsion 240 ml/day to provide: 144 g of protein and 2568 kCals per day - Glucose infusion rate will be 3.2 mg/kg/min (Maximum 5 mg/kg/min)  Clinimix 5/20 (no electrolytes) at rate of 115 ml/hr with lipids 3x/week on MWF only would provide: 138g protein and avg 2174 kcal/day - Glucose infusion rate will be 4.11 mg/kg/min (Maximum 5 mg/kg/min)   PLAN                                                                                                                         Now: Magnesium sulfate 2g IV x 1 (per MD) Potassium chloride 53mq IV x 3  At 1800 today: ? Continue Clinimix E 5/20 at 115 ml/hr.  Will not add additional sodium to TPN (E formulation includes 70 mEq of Na). Continue to monitor calcium phosphorus product and consider removal of electrolytes if rises > 55. ? Lipids at 222m/hr x 12 hours 3 times weekly on MWF  TPN to contain standard multivitamins and trace elements daily  Continue IVF at [facility] moderate SSI q6h.  TPN lab panels on Mondays & Thursdays.  Check BMET, Mag & Phos in AM  MiNetta CedarsPharmD, BCPS Pager: [phone]/18/2018 7:08 AM

## 2017-06-13 NOTE — Progress Notes (Signed)
[facility] Consultation Progress Note  Patient: [name] [name] FGH:[id number]   PCP: Lujean Amel, MD DOB: 1961/01/28   DOA: 05/06/2017   DOS: 06/13/2017   Date of Service: the patient was seen and examined on 06/13/2017 Primary service: [name], Arta Bruce, MD   Subjective: Feeling better. Complains to have some headache. No other acute complaint. No nausea no vomiting.  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 Patient's course was complicated by small bowel injury requiring resection with anastomoses and placement of wound VAC. On 7/18, patient developed leak and was taken back to the operating room for redo of his exploratory laparotomy and repair of leak. That time, patient returned to the ICU on the ventilator. Critical care was consulted for ventilator/medical management.  07/19 - NGT pulled out and almost self-extubated. Developed cuff leak >> tube exchanged. Back to OR emergently for intestinal leak/ischemic bowel.TPN started at that time 07/22 - Back to OR for wash out >> left with open abdomen & wound vac in place. Transfused 1u PRBC. 07/25 - rising temp, WBC increased. Returned to OR for closure 07/27 - Recultured, sedation down on precedex [facility] 7/27 >> MRSE 1/2 > contaminant ? 07/30 - Remains on sedation, VAC leak over the weekend with dressing change by [facility] 07/31 - Initial attempt at weaning failed to due to tachypnea/anxiety 08/02 - Fever 101.1, WBC 14.2, weaned / extubated 08/03 - O2 weaned to 1L. Remains on dilaudid, initially on gtt now PCA. RN reports precedex started overnight for anxiety. 08/04 - Ultrasound-guided aspiration By interventional radiology of small  perihepatic fluid collection yielded only 1 mL of the fluid, sent for culture. No growth yet 08/06 - patient improving. Off Precedex drip since 8/4. Still on TPN 08/08 - wound vac changed per surgery. WBC 16. Abx d/c per surgery  08/13 - Patient with increased tachycardia and developed a fever.  Wound VAC noted with feculent material.  Started on IV Zosyn 08/14 - CT abdomen pelvis shows extraluminal stool in the right lower quadrant, surgery expects conservative management.  Currently further plan is continue IV antibiotics transition to oral when stable.  Assessment and Plan: 1. Sepsis- resolved now due to peritonitis and MSSA HCAP.   white count has been overall stable in the last couple of weeks, and antibiotics were discontinued by general surgery on 8/8.   Zosyn had to be restarted however on 8/13 due to concern for recurrent sepsis given fever and elevated heart rate. Heart rate as well as leukocytosis getting better. Patient primarily have leaking bowel, will defer to surgery for further management for definitive treatment, currently plan is conservative management with frequent wound VAC dressing changes.  At present we'll continue IV antibiotics for a total of 7 day from clinical improvement, last day for antibiotic 06/16/2017.  2. Peritonitis/intestinal perforation/anastomotic leak - 05/29/2017 CT abdomen and pelvis thick walled fluid collection along anterior and lateral aspects of the liver; extends 20 cm along anterior and lateral margins of liver.   Interventional radiology was consulted and on 05/30/2017 he underwent aspiration.  Cultures have remained negative. 05/20/17--ABDOMINAL WOUND [place] OUT, WOUND CLOSURE AND WOUND VAC PLACEMENT.  NG remains in place, patient will need to be on TPN for quite  some time per general surgery  3. Acute respiratory failure with hypoxia -resolved Secondary to sepsis and HCAP post op adrenalectomy.  Currently he is stable on nasal cannula.  He was  intubated and has been extubated since 05/28/2017.  Continue DuoNeb's. -Wean off oxygen as tolerated  4. Sinus tachycardia -ongoing, likely related to peritonitis -2D echo obtained 8/13 showed an EF of 55-60% and grade 1 diastolic dysfunction  5. Acute postoperative blood loss anemia. Hemoglobin slowly trending downwards over last few days, initial drop was due to postoperative blood loss anemia. Current downward trend is most likely dilutional in the setting of receiving TPN as the patient is a 11 L positive. Patient is getting one PRBC due to being symptomatic. Will monitor.  There is some concerns for coffee-ground return in his NG tube therefore he is on SCDs and his Lovenox is on hold. On Protonix injectable twice a day as well as Pepcid added.  6. Hyponatremia, hypomagnesemia, hypokalemia, hyperphosphatemia -Suspect due to TPN, -TSH normal - Daily monitoring and correction  7 MSSA HCAP  finished 7 days of cefepime, afebrile, stable respiratory status Continue flutter device as well as incentive spirometer.  8. R-adrenal mass/pheochromocytoma  -s/p R-adrenalectomy, surgery was complicated by small bowel injury in the setting of adhesions with sepsis secondary to intra-abdominal infection Blood pressure remained stable after the surgery.  9. Acute metabolic encephalopathy -multifactorial including sepsis, infection, electrolyte derangement, opioids/benzos.  Stable  10 Thrombocytosis -likely acute phase reactant and possible iron deficiency -pt had normal platelet count at time of admission  11. Essential Hypertension -remains stable off home clonidine, labetalol, chlorthalidone, valsartan  12. Chronic pain syndrome/Post-op pain -had significant post-op pain -remains on dilaudid PCA, po oxycodone scheduled, ativan scheduled -pain management per [facility] -previously on home gaba, percocet  13. Hyperlipidemia -remain off lipitor until able to tolerate po reliably  14  Tobacco abuse -nicoderm patch  15 Hyperglycemia -likely stress induced and TPN -A1C was 5.6, not a diabetic -continue novolog sliding scale  Diet: NPO on TPN DVT Prophylaxis: SCD  Family Communication: family was present at bedside, at the time of interview. The pt provided permission to discuss medical plan with the family. Opportunity was given to ask question and all questions were answered satisfactorily.   Disposition: We will continue to follow the patient.   Recommendation on discharge: To be determined.   Other Consultants: Wound care, critical care Procedures: Echocardiogram, rest per progress note Antibiotics: Anti-infectives    Start     Dose/Rate Route Frequency Ordered Stop   06/08/17 1200  piperacillin-tazobactam (ZOSYN) IVPB 3.375 g     3.375 g 12.5 mL/hr over 240 Minutes Intravenous Every 8 hours 06/08/17 1056     05/29/17 2200  ceFEPIme (MAXIPIME) 2 g in dextrose 5 % 50 mL IVPB  Status:  Discontinued     2 g 100 mL/hr over 30 Minutes Intravenous Every 8 hours 05/29/17 2031 06/03/17 0832   05/29/17 2100  metroNIDAZOLE (FLAGYL) IVPB 500 mg  Status:  Discontinued     500 mg 100 mL/hr over 60 Minutes Intravenous Every 8 hours 05/29/17 2029 06/03/17 0832   05/26/17 2200  ceFAZolin (ANCEF) IVPB 1 g/50 mL premix  Status:  Discontinued     1 g 100 mL/hr over 30 Minutes Intravenous Every 8 hours 05/26/17 1008 05/29/17 2023   05/25/17 1100  vancomycin (VANCOCIN) 1,250 mg in sodium chloride 0.9 % 250 mL IVPB  Status:  Discontinued     1,250 mg 166.7 mL/hr over  90 Minutes Intravenous Every 12 hours 05/25/17 0955 05/26/17 0946   05/22/17 1400  anidulafungin (ERAXIS) 100 mg in sodium chloride 0.9 % 100 mL IVPB     100 mg 78 mL/hr over 100 Minutes Intravenous Every 24 hours 05/21/17 1330 05/27/17 1740   05/21/17 1400  anidulafungin (ERAXIS) 200 mg in sodium chloride 0.9 % 200 mL IVPB     200 mg 78 mL/hr over 200 Minutes Intravenous  Once 05/21/17 1330 05/21/17 1940    05/15/17 1000  anidulafungin (ERAXIS) 100 mg in sodium chloride 0.9 % 100 mL IVPB  Status:  Discontinued     100 mg 78 mL/hr over 100 Minutes Intravenous Every 24 hours 05/14/17 0829 05/16/17 0901   05/15/17 1000  metroNIDAZOLE (FLAGYL) IVPB 500 mg  Status:  Discontinued     500 mg 100 mL/hr over 60 Minutes Intravenous Every 8 hours 05/15/17 0903 05/25/17 0938   05/14/17 0900  anidulafungin (ERAXIS) 200 mg in sodium chloride 0.9 % 200 mL IVPB     200 mg 78 mL/hr over 200 Minutes Intravenous  Once 05/14/17 0829 05/14/17 1325   05/13/17 1927  vancomycin (VANCOCIN) 1-5 GM/200ML-% IVPB    Comments:  Ward, Christa   : cabinet override      05/13/17 1927 05/14/17 0744   05/12/17 1400  ceFEPIme (MAXIPIME) 1 g in dextrose 5 % 50 mL IVPB     1 g 100 mL/hr over 30 Minutes Intravenous Every 8 hours 05/12/17 0939 05/26/17 2359   05/12/17 0900  vancomycin (VANCOCIN) IVPB 1000 mg/200 mL premix  Status:  Discontinued     1,000 mg 200 mL/hr over 60 Minutes Intravenous Every 12 hours 05/12/17 0750 05/16/17 0852   05/12/17 0830  aztreonam (AZACTAM) 2 GM IVPB     2 g 100 mL/hr over 30 Minutes Intravenous  Once 05/12/17 0800 05/12/17 0957   05/06/17 0916  vancomycin (VANCOCIN) IVPB 1000 mg/200 mL premix     1,000 mg 200 mL/hr over 60 Minutes Intravenous On call to O.R. 05/06/17 0916 05/06/17 1229      Objective: Physical Exam: Vitals:   06/13/17 0606 06/13/17 0800 06/13/17 1000 06/13/17 1400  BP:  113/71 107/72 115/69  Pulse:  95 95 (!) 116  Resp:  16 (!) 22 20  Temp: 98.3 F (36.8 C) 98.2 F (36.8 C)    TempSrc: Oral Oral    SpO2:  94% 94% (!) 86%  Weight: 82 kg (180 lb 12.4 oz)     Height:        Intake/Output Summary (Last 24 hours) at 06/13/17 1451 Last data filed at 06/13/17 5638  Gross per 24 hour  Intake          3039.75 ml  Output             2818 ml  Net           221.75 ml   Filed Weights   06/10/17 0600 06/11/17 0700 06/13/17 0606  Weight: 76.5 kg (168 lb 10.4 oz) 80.2 kg  (176 lb 12.9 oz) 82 kg (180 lb 12.4 oz)   General: Alert, Awake and Oriented to Time, Place and Person. Appear in mild distress, affect flat Eyes: PERRL, Conjunctiva normal ENT: Oral Mucosa clear moist. Cardiovascular: S1 and S2 Present, no Murmur, Respiratory: normal respiratory effort, Bilateral Air entry equal and Decreased, no use of accessory muscle, faint basal Crackles, no wheezes Abdomen: Bowel Sound present, Soft and mild diffuse tenderness, Extremities: trace Pedal edema, no calf tenderness  Neurologic: Grossly no focal neuro deficit. Bilaterally Equal motor strength   Data Reviewed: CBC:  Recent Labs Lab 06/08/17 0525 06/09/17 0600 06/10/17 0603 06/11/17 0804 06/11/17 2023 06/12/17 0607 06/13/17 0430  WBC 15.4* 13.8* 13.0* 12.2*  --  11.1* 10.2  NEUTROABS 8.9*  --   --  7.1  --   --   --   HGB 9.3* 8.4* 8.1* 7.7* 8.8* 8.8* 8.2*  HCT 28.0* 26.6* 25.1* 24.2* 27.0* 27.4* 25.5*  MCV 79.5 79.4 79.7 80.9  --  79.9 85.3  PLT 635* 560* 529* 503*  --  453* 427*   Basic Metabolic Panel:  Recent Labs Lab 06/08/17 0525 06/09/17 0600 06/10/17 0603 06/10/17 0616 06/11/17 0526 06/12/17 0607 06/13/17 0430  NA 132* 136 138  --  142 142 140  K 4.4 3.9 3.5  --  3.4* 3.6 3.3*  CL 95* 94* 97*  --  101 106 106  CO2 27 30 32  --  34* 30 26  GLUCOSE 122* 150* 121*  --  110* 98 126*  BUN 36* 32* 26*  --  24* 23* 22*  CREATININE 0.95 0.98 0.95  --  0.87 0.76 0.69  CALCIUM 9.5 9.3 9.1  --  9.1 9.1 8.8*  MG 1.7  --   --  1.6* 1.8 1.4* 1.6*  PHOS 5.8* 5.7* 4.9*  --  5.0* 4.2 5.0*   Liver Function Tests:  Recent Labs Lab 06/08/17 0525 06/10/17 0603 06/11/17 0526 06/12/17 0607  AST 32 23 27 23   ALT 27 21 21 18   ALKPHOS 212* 174* 165* 145*  BILITOT 0.8 0.6 0.6 0.8  PROT 7.8 7.1 7.0 6.8  ALBUMIN 2.8* 2.5* 2.4* 2.4*   No results for input(s): LIPASE, AMYLASE in the last 168 hours. No results for input(s): AMMONIA in the last 168 hours. Cardiac Enzymes: No results for  input(s): CKTOTAL, CKMB, CKMBINDEX, TROPONINI in the last 168 hours. BNP (last 3 results) No results for input(s): BNP in the last 8760 hours. CBG:  Recent Labs Lab 06/12/17 1251 06/12/17 1626 06/12/17 2349 06/13/17 0552 06/13/17 1134  GLUCAP 122* 120* 121* 115* 129*   Recent Results (from the past 240 hour(s))  MRSA PCR Screening     Status: None   Collection Time: 06/11/17  5:59 PM  Result Value Ref Range Status   MRSA by PCR NEGATIVE NEGATIVE Final    Comment:        The GeneXpert MRSA Assay (FDA approved for NASAL specimens only), is one component of a comprehensive MRSA colonization surveillance program. It is not intended to diagnose MRSA infection nor to guide or monitor treatment for MRSA infections.     Studies: No results found.   Scheduled Meds: . chlorhexidine  15 mL Mouth Rinse BID  . Chlorhexidine Gluconate Cloth  6 each Topical Q1200  . HYDROmorphone   Intravenous Q4H  . insulin aspart  0-15 Units Subcutaneous Q6H  . ipratropium-albuterol  3 mL Nebulization BID  . lip balm  1 application Topical BID  . LORazepam  2 mg Oral Q6H  . mouth rinse  15 mL Mouth Rinse q12n4p  . nicotine  21 mg Transdermal Daily  . oxyCODONE  10 mg Per Tube Q4H  . pantoprazole (PROTONIX) IV  40 mg Intravenous Q12H  . thiamine injection  100 mg Intravenous Daily   Continuous Infusions: . chlorproMAZINE (THORAZINE) IV Stopped (06/09/17 1649)  . famotidine (PEPCID) IV Stopped (06/13/17 0848)  . piperacillin-tazobactam (ZOSYN)  IV 3.375 g (06/13/17  1224)  . .TPN (CLINIMIX-E) Adult 115 mL/hr at 06/13/17 0600  . Marland KitchenTPN (CLINIMIX-E) Adult     PRN Meds: acetaminophen, butalbital-acetaminophen-caffeine, chlorproMAZINE (THORAZINE) IV, diphenhydrAMINE, HYDROmorphone, iopamidol, labetalol, LORazepam, magic mouthwash, menthol-cetylpyridinium, naloxone **AND** sodium chloride flush, ondansetron **OR** ondansetron (ZOFRAN) IV, phenol, sodium chloride flush  Time spent: 35  minutes  Author: Berle Mull, MD [facility] Pager: [phone] 06/13/2017 2:51 PM  If 7PM-7AM, please contact night-coverage at [email or web address], password [email or web address]

## 2017-06-13 NOTE — Consult Note (Signed)
[place] Nurse wound follow up NPWT (VAC) dressing change today to abdominal surgical wound.  Measurement: 7 cm x 22.5cm x 1.8cm Wound bed: 80% red with mesh lifting in center.  Brown feculent material in inferior portion of wound at center and lifting mesh encompasses approximately 20% of wound bed. Brown/feculent material noted beneath mesh and in NPWT cannister and tubing.  White foam with brown feculent material lifts easily without adherence. Drainage (amount, consistency, odor) See above Periwound:Intact with epidermal edge rolling from 3-9 o'clock and from 10-2 o'clock. Dressing procedure/placement/frequency: Drape removed using medical adhesive releaser.  Wound contact layers removed, (2), one black and 1 white.  Wound cleansed with NS, and gently patted dry.   Wound redressed using 1 piece of white foam in the center of the wound bed and two pieces of black foam to obliterate the dead space.  BEdside RN assists with drape application.  Drape applied, dressing connected to continuous negative pressure at 187mmHg and an immediate seal is achieved.  Supplies are in room for next dressing change. [place] team will follow and remain available to patient, medical and nursing teams.  Domenic Moras RN BSN [place] Pager [phone]

## 2017-06-13 NOTE — Progress Notes (Signed)
2.5 ml of dilaudid PCA wasted and witnessed by Sharlene Dory RN  Juanda Chance

## 2017-06-14 LAB — COMPREHENSIVE METABOLIC PANEL WITH GFR
ALT: 18 U/L (ref 17–63)
AST: 21 U/L (ref 15–41)
Albumin: 2.3 g/dL — ABNORMAL LOW (ref 3.5–5.0)
Alkaline Phosphatase: 123 U/L (ref 38–126)
Anion gap: 6 (ref 5–15)
BUN: 23 mg/dL — ABNORMAL HIGH (ref 6–20)
CO2: 25 mmol/L (ref 22–32)
Calcium: 8.8 mg/dL — ABNORMAL LOW (ref 8.9–10.3)
Chloride: 106 mmol/L (ref 101–111)
Creatinine, Ser: 0.63 mg/dL (ref 0.61–1.24)
GFR calc Af Amer: >60 mL/min
GFR calc non Af Amer: >60 mL/min
Glucose, Bld: 108 mg/dL — ABNORMAL HIGH (ref 65–99)
Potassium: 3.8 mmol/L (ref 3.5–5.1)
Sodium: 137 mmol/L (ref 135–145)
Total Bilirubin: 1.1 mg/dL (ref 0.3–1.2)
Total Protein: 6.6 g/dL (ref 6.5–8.1)

## 2017-06-14 LAB — CBC
HCT: 26.7 % — ABNORMAL LOW (ref 39.0–52.0)
Hemoglobin: 8.6 g/dL — ABNORMAL LOW (ref 13.0–17.0)
MCH: 25.9 pg — ABNORMAL LOW (ref 26.0–34.0)
MCHC: 32.2 g/dL (ref 30.0–36.0)
MCV: 80.4 fL (ref 78.0–100.0)
Platelets: 433 10*3/uL — ABNORMAL HIGH (ref 150–400)
RBC: 3.32 MIL/uL — ABNORMAL LOW (ref 4.22–5.81)
RDW: 16.7 % — ABNORMAL HIGH (ref 11.5–15.5)
WBC: 10.9 10*3/uL — ABNORMAL HIGH (ref 4.0–10.5)

## 2017-06-14 LAB — MAGNESIUM: Magnesium: 1.6 mg/dL — ABNORMAL LOW (ref 1.7–2.4)

## 2017-06-14 LAB — GLUCOSE, CAPILLARY
GLUCOSE-CAPILLARY: 120 mg/dL — AB (ref 65–99)
Glucose-Capillary: 119 mg/dL — ABNORMAL HIGH (ref 65–99)
Glucose-Capillary: 125 mg/dL — ABNORMAL HIGH (ref 65–99)

## 2017-06-14 LAB — PHOSPHORUS: Phosphorus: 4.8 mg/dL — ABNORMAL HIGH (ref 2.5–4.6)

## 2017-06-14 MED ORDER — MAGNESIUM SULFATE 4 GM/100ML IV SOLN
4.0000 g | Freq: Once | INTRAVENOUS | Status: AC
  Administered 2017-06-14: 4 g via INTRAVENOUS
  Filled 2017-06-14: qty 100

## 2017-06-14 MED ORDER — LORAZEPAM 2 MG/ML IJ SOLN
1.0000 mg | INTRAMUSCULAR | Status: DC | PRN
  Administered 2017-06-16 – 2017-06-26 (×18): 1 mg via INTRAVENOUS
  Filled 2017-06-14 (×20): qty 1

## 2017-06-14 MED ORDER — SODIUM CHLORIDE 0.9 % IV SOLN
INTRAVENOUS | Status: DC
  Administered 2017-06-14 – 2017-06-17 (×2): via INTRAVENOUS
  Administered 2017-06-18: 250 mL via INTRAVENOUS
  Administered 2017-06-19: 10:00:00 via INTRAVENOUS

## 2017-06-14 MED ORDER — TRACE MINERALS CR-CU-MN-SE-ZN 10-1000-500-60 MCG/ML IV SOLN
INTRAVENOUS | Status: AC
  Administered 2017-06-14: 18:00:00 via INTRAVENOUS
  Filled 2017-06-14: qty 2000
  Filled 2017-06-14: qty 2760

## 2017-06-14 NOTE — Progress Notes (Signed)
[facility] Consultation Progress Note  Patient: [name] [name] OZD:[id number]   PCP: Lujean Amel, MD DOB: 1961/09/07   DOA: 05/06/2017   DOS: 06/14/2017   Date of Service: the patient was seen and examined on 06/14/2017 Primary service: [name], Arta Bruce, MD   Subjective: Patient was sleeping. Denied any acute complaint. No nausea no vomiting. No acute events overnight.  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 Patient's course was complicated by small bowel injury requiring resection with anastomoses and placement of wound VAC. On 7/18, patient developed leak and was taken back to the operating room for redo of his exploratory laparotomy and repair of leak. That time, patient returned to the ICU on the ventilator. Critical care was consulted for ventilator/medical management.  07/19 - NGT pulled out and almost self-extubated. Developed cuff leak >> tube exchanged. Back to OR emergently for intestinal leak/ischemic bowel.TPN started at that time 07/22 - Back to OR for wash out >> left with open abdomen & wound vac in place. Transfused 1u PRBC. 07/25 - rising temp, WBC increased. Returned to OR for closure 07/27 - Recultured, sedation down on precedex [facility] 7/27 >> MRSE 1/2 > contaminant ? 07/30 - Remains on sedation, VAC leak over the weekend with dressing change by [facility] 07/31 - Initial attempt at weaning failed to due to tachypnea/anxiety 08/02 - Fever 101.1, WBC 14.2, weaned / extubated 08/03 - O2 weaned to 1L. Remains on dilaudid, initially on gtt now PCA. RN reports precedex started overnight for anxiety. 08/04 - Ultrasound-guided aspiration By interventional radiology of small  perihepatic fluid collection yielded only 1 mL of the fluid, sent for culture. No growth yet 08/06 - patient improving. Off Precedex drip since 8/4. Still on TPN 08/08 - wound vac changed per surgery. WBC 16. Abx d/c per surgery  08/13 - Patient with increased tachycardia and developed a fever.  Wound VAC noted with feculent material.  Started on IV Zosyn 08/14 - CT abdomen pelvis shows extraluminal stool in the right lower quadrant, surgery expects conservative management.  Currently further plan is continue IV antibiotics transition to oral when stable.  Assessment and Plan: 1. Sepsis- resolved now due to peritonitis and MSSA HCAP.   white count has been overall stable in the last couple of weeks, and antibiotics were discontinued by general surgery on 8/8.   Zosyn had to be restarted however on 8/13 due to concern for recurrent sepsis given fever and elevated heart rate. Heart rate as well as leukocytosis getting better. Patient primarily have leaking bowel, will defer to surgery for further management for definitive treatment, currently plan is conservative management with frequent wound VAC dressing changes.  At present we'll continue IV antibiotics for a total of 7 day from clinical improvement, last day for antibiotic 06/16/2017.  2. Peritonitis/intestinal perforation/anastomotic leak - 05/29/2017 CT abdomen and pelvis thick walled fluid collection along anterior and lateral aspects of the liver; extends 20 cm along anterior and lateral margins of liver.   Interventional radiology was consulted and on 05/30/2017 he underwent aspiration.  Cultures have remained negative. 05/20/17--ABDOMINAL WOUND [place] OUT, WOUND CLOSURE AND WOUND VAC PLACEMENT.  NG remains in place, patient will need to be on TPN for quite  some time per general surgery  3. Acute respiratory failure with hypoxia -resolved Secondary to sepsis and HCAP post op adrenalectomy.  Currently he is stable on nasal cannula.  He was  intubated and has been extubated since 05/28/2017.  Continue DuoNeb's. -Wean off oxygen as tolerated\ Repeat chest x-ray tomorrow.  4. Sinus tachycardia -ongoing, likely related to peritonitis -2D echo obtained 8/13 showed an EF of 55-60% and grade 1 diastolic dysfunction  5. Acute postoperative blood loss anemia. Hemoglobin slowly trending downwards over last few days, initial drop was due to postoperative blood loss anemia. Current downward trend is most likely dilutional in the setting of receiving TPN as the patient is a 11 L positive. Patient is getting one PRBC due to being symptomatic. Will monitor.  There is some concerns for coffee-ground return in his NG tube therefore he is on SCDs and his Lovenox is on hold. On Protonix injectable twice a day as well as Pepcid added.  6. Hyponatremia, hypomagnesemia, hypokalemia, hyperphosphatemia -Suspect due to TPN, -TSH normal - Daily monitoring and correction  7 MSSA HCAP  finished 7 days of cefepime, afebrile, stable respiratory status Continue flutter device as well as incentive spirometer.  8. R-adrenal mass/pheochromocytoma  -s/p R-adrenalectomy, surgery was complicated by small bowel injury in the setting of adhesions with sepsis secondary to intra-abdominal infection Blood pressure remained stable after the surgery.  9. Acute metabolic encephalopathy -multifactorial including sepsis, infection, electrolyte derangement, opioids/benzos.  Stable  10 Thrombocytosis -likely acute phase reactant and possible iron deficiency -pt had normal platelet count at time of admission  11. Essential Hypertension -remains stable off home clonidine, labetalol, chlorthalidone, valsartan  12. Chronic pain syndrome/Post-op pain -had significant post-op pain -remains on dilaudid PCA, po oxycodone scheduled, ativan scheduled -pain management per [facility] -previously on home gaba, percocet  13. Hyperlipidemia -remain off lipitor until able  to tolerate po reliably  14 Tobacco abuse -nicoderm patch  15 Hyperglycemia -likely stress induced and TPN -A1C was 5.6, not a diabetic -continue novolog sliding scale  Diet: NPO on TPN DVT Prophylaxis: SCD  Family Communication: family was present at bedside, at the time of interview. The pt provided permission to discuss medical plan with the family. Opportunity was given to ask question and all questions were answered satisfactorily.   Disposition: We will continue to follow the patient.   Recommendation on discharge: To be determined.   Other Consultants: Wound care, critical care Procedures: Echocardiogram, rest per progress note Antibiotics: Anti-infectives    Start     Dose/Rate Route Frequency Ordered Stop   06/08/17 1200  piperacillin-tazobactam (ZOSYN) IVPB 3.375 g     3.375 g 12.5 mL/hr over 240 Minutes Intravenous Every 8 hours 06/08/17 1056     05/29/17 2200  ceFEPIme (MAXIPIME) 2 g in dextrose 5 % 50 mL IVPB  Status:  Discontinued     2 g 100 mL/hr over 30 Minutes Intravenous Every 8 hours 05/29/17 2031 06/03/17 0832   05/29/17 2100  metroNIDAZOLE (FLAGYL) IVPB 500 mg  Status:  Discontinued     500 mg 100 mL/hr over 60 Minutes Intravenous Every 8 hours 05/29/17 2029 06/03/17 0832   05/26/17 2200  ceFAZolin (ANCEF) IVPB 1 g/50 mL premix  Status:  Discontinued     1 g 100 mL/hr over 30 Minutes Intravenous Every 8 hours 05/26/17 1008 05/29/17 2023   05/25/17 1100  vancomycin (VANCOCIN) 1,250 mg in sodium chloride 0.9 % 250 mL IVPB  Status:  Discontinued     1,250  mg 166.7 mL/hr over 90 Minutes Intravenous Every 12 hours 05/25/17 0955 05/26/17 0946   05/22/17 1400  anidulafungin (ERAXIS) 100 mg in sodium chloride 0.9 % 100 mL IVPB     100 mg 78 mL/hr over 100 Minutes Intravenous Every 24 hours 05/21/17 1330 05/27/17 1740   05/21/17 1400  anidulafungin (ERAXIS) 200 mg in sodium chloride 0.9 % 200 mL IVPB     200 mg 78 mL/hr over 200 Minutes Intravenous  Once  05/21/17 1330 05/21/17 1940   05/15/17 1000  anidulafungin (ERAXIS) 100 mg in sodium chloride 0.9 % 100 mL IVPB  Status:  Discontinued     100 mg 78 mL/hr over 100 Minutes Intravenous Every 24 hours 05/14/17 0829 05/16/17 0901   05/15/17 1000  metroNIDAZOLE (FLAGYL) IVPB 500 mg  Status:  Discontinued     500 mg 100 mL/hr over 60 Minutes Intravenous Every 8 hours 05/15/17 0903 05/25/17 0938   05/14/17 0900  anidulafungin (ERAXIS) 200 mg in sodium chloride 0.9 % 200 mL IVPB     200 mg 78 mL/hr over 200 Minutes Intravenous  Once 05/14/17 0829 05/14/17 1325   05/13/17 1927  vancomycin (VANCOCIN) 1-5 GM/200ML-% IVPB    Comments:  Ward, Christa   : cabinet override      05/13/17 1927 05/14/17 0744   05/12/17 1400  ceFEPIme (MAXIPIME) 1 g in dextrose 5 % 50 mL IVPB     1 g 100 mL/hr over 30 Minutes Intravenous Every 8 hours 05/12/17 0939 05/26/17 2359   05/12/17 0900  vancomycin (VANCOCIN) IVPB 1000 mg/200 mL premix  Status:  Discontinued     1,000 mg 200 mL/hr over 60 Minutes Intravenous Every 12 hours 05/12/17 0750 05/16/17 0852   05/12/17 0830  aztreonam (AZACTAM) 2 GM IVPB     2 g 100 mL/hr over 30 Minutes Intravenous  Once 05/12/17 0800 05/12/17 0957   05/06/17 0916  vancomycin (VANCOCIN) IVPB 1000 mg/200 mL premix     1,000 mg 200 mL/hr over 60 Minutes Intravenous On call to O.R. 05/06/17 0916 05/06/17 1229      Objective: Physical Exam: Vitals:   06/14/17 0757 06/14/17 0800 06/14/17 0827 06/14/17 1145  BP:  103/78    Pulse:  89 94   Resp:  18 18 20   Temp: 97.8 F (36.6 C)   97.7 F (36.5 C)  TempSrc: Axillary   Oral  SpO2:  97% 98% 99%  Weight:      Height:        Intake/Output Summary (Last 24 hours) at 06/14/17 1439 Last data filed at 06/14/17 0832  Gross per 24 hour  Intake          2785.34 ml  Output             2285 ml  Net           500.34 ml   Filed Weights   06/11/17 0700 06/13/17 0606 06/14/17 0500  Weight: 80.2 kg (176 lb 12.9 oz) 82 kg (180 lb 12.4 oz)  81.4 kg (179 lb 7.3 oz)   General: Alert, Awake and Oriented to Time, Place and Person. Appear in mild distress, affect flat Eyes: PERRL, Conjunctiva normal ENT: Oral Mucosa clear moist. Cardiovascular: S1 and S2 Present, no Murmur, Respiratory: normal respiratory effort, Bilateral Air entry equal and Decreased, no use of accessory muscle, faint basal Crackles, no wheezes Abdomen: Bowel Sound present, Soft and mild diffuse tenderness, Extremities: trace Pedal edema, no calf tenderness Neurologic: Grossly no focal  neuro deficit. Bilaterally Equal motor strength   Data Reviewed: CBC:  Recent Labs Lab 06/08/17 0525  06/10/17 0603 06/11/17 0804 06/11/17 2023 06/12/17 0607 06/13/17 0430 06/14/17 0400  WBC 15.4*  < > 13.0* 12.2*  --  11.1* 10.2 10.9*  NEUTROABS 8.9*  --   --  7.1  --   --   --   --   HGB 9.3*  < > 8.1* 7.7* 8.8* 8.8* 8.2* 8.6*  HCT 28.0*  < > 25.1* 24.2* 27.0* 27.4* 25.5* 26.7*  MCV 79.5  < > 79.7 80.9  --  79.9 85.3 80.4  PLT 635*  < > 529* 503*  --  453* 430* 433*  < > = values in this interval not displayed. Basic Metabolic Panel:  Recent Labs Lab 06/10/17 0603 06/10/17 0616 06/11/17 0526 06/12/17 0607 06/13/17 0430 06/14/17 0400  NA 138  --  142 142 140 137  K 3.5  --  3.4* 3.6 3.3* 3.8  CL 97*  --  101 106 106 106  CO2 32  --  34* 30 26 25   GLUCOSE 121*  --  110* 98 126* 108*  BUN 26*  --  24* 23* 22* 23*  CREATININE 0.95  --  0.87 0.76 0.69 0.63  CALCIUM 9.1  --  9.1 9.1 8.8* 8.8*  MG  --  1.6* 1.8 1.4* 1.6* 1.6*  PHOS 4.9*  --  5.0* 4.2 5.0* 4.8*   Liver Function Tests:  Recent Labs Lab 06/08/17 0525 06/10/17 0603 06/11/17 0526 06/12/17 0607 06/14/17 0400  AST 32 23 27 23 21   ALT 27 21 21 18 18   ALKPHOS 212* 174* 165* 145* 123  BILITOT 0.8 0.6 0.6 0.8 1.1  PROT 7.8 7.1 7.0 6.8 6.6  ALBUMIN 2.8* 2.5* 2.4* 2.4* 2.3*   No results for input(s): LIPASE, AMYLASE in the last 168 hours. No results for input(s): AMMONIA in the last 168  hours. Cardiac Enzymes: No results for input(s): CKTOTAL, CKMB, CKMBINDEX, TROPONINI in the last 168 hours. BNP (last 3 results) No results for input(s): BNP in the last 8760 hours. CBG:  Recent Labs Lab 06/13/17 1134 06/13/17 1830 06/13/17 2353 06/14/17 0616 06/14/17 1113  GLUCAP 129* 115* 115* 120* 119*   Recent Results (from the past 240 hour(s))  MRSA PCR Screening     Status: None   Collection Time: 06/11/17  5:59 PM  Result Value Ref Range Status   MRSA by PCR NEGATIVE NEGATIVE Final    Comment:        The GeneXpert MRSA Assay (FDA approved for NASAL specimens only), is one component of a comprehensive MRSA colonization surveillance program. It is not intended to diagnose MRSA infection nor to guide or monitor treatment for MRSA infections.     Studies: No results found.   Scheduled Meds: . chlorhexidine  15 mL Mouth Rinse BID  . Chlorhexidine Gluconate Cloth  6 each Topical Q1200  . HYDROmorphone   Intravenous Q4H  . insulin aspart  0-15 Units Subcutaneous Q6H  . ipratropium-albuterol  3 mL Nebulization BID  . lip balm  1 application Topical BID  . LORazepam  2 mg Oral Q6H  . mouth rinse  15 mL Mouth Rinse q12n4p  . nicotine  21 mg Transdermal Daily  . oxyCODONE  10 mg Per Tube Q4H  . pantoprazole (PROTONIX) IV  40 mg Intravenous Q12H  . thiamine injection  100 mg Intravenous Daily   Continuous Infusions: . Marland KitchenTPN (CLINIMIX-E) Adult    .  chlorproMAZINE (THORAZINE) IV Stopped (06/09/17 1649)  . famotidine (PEPCID) IV Stopped (06/14/17 0915)  . piperacillin-tazobactam (ZOSYN)  IV 3.375 g (06/14/17 1141)  . Marland KitchenTPN (CLINIMIX-E) Adult 115 mL/hr at 06/13/17 1841   PRN Meds: acetaminophen, butalbital-acetaminophen-caffeine, chlorproMAZINE (THORAZINE) IV, diphenhydrAMINE, HYDROmorphone, iopamidol, labetalol, LORazepam, magic mouthwash, menthol-cetylpyridinium, naloxone **AND** sodium chloride flush, ondansetron **OR** ondansetron (ZOFRAN) IV, phenol, sodium  chloride flush  Time spent: 35 minutes  Author: Berle Mull, MD [facility] Pager: [phone] 06/14/2017 2:39 PM  If 7PM-7AM, please contact night-coverage at [email or web address], password [facility]

## 2017-06-14 NOTE — Progress Notes (Signed)
[facility] Office:  [phone] General Surgery Progress Note   LOS: 39 days  POD -  25 Days Post-Op  Chief Complaint: Abdominal pain  Assessment and Plan: 1.  OPEN RIGHT ADRENALECTOMY WITH SMALL BOWEL RESECTION AND ANASTOMOSIS - 05/06/2017 - [name]  Additional surgery - 05/13/2017, 05/14/2017, 05/17/2017 and 05/20/2017  Currently with distal ileum in discontinuity and catheter drainage of the 2 bowel ends. Now has new apparent enteric leak from a separate anastomosis communicating to the anterior abdomen and the VAC dressing.   Last CT scan - 06/09/2017  WBC - 10,900 - 06/14/2017  Zosyn- 06/07/2017 >>>  2.  Malnutrition  On TPN (also for bowel rest)  3.  HTN 4.  COPD/Smokes 5.  Anemia  Hgb - 8.6 - 06/14/2017 6.  On chronic pain meds             Prior to admission - he took about 40 mg oxycodone per day for neck - goes to pain clinic - sees Dr. [name] for neck 7.  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  Complains of HA again.  Wife - Retia - [phone])  Objective:   Vitals:   06/14/17 0600 06/14/17 0757  BP: 100/73   Pulse: 94   Resp: 18   Temp: 99.1 F (37.3 C) 97.8 F (36.6 C)  SpO2: 97%      Intake/Output from previous day:  08/18 0701 - 08/19 0700 In: 2785.3 [I.V.:2315.3; NG/GT:20; IV Piggyback:350] Out: 2229 [Urine:725; Emesis/NG  output:550; Drains:510]  Intake/Output this shift:  No intake/output data recorded.   Physical Exam:   General: AA M who is alert and oriented.    HEENT: Normal. Pupils equal.  Has NGT .   Lungs: Clear - though he is spitting up some phlegm   Abdomen: RUQ VAC.  Still some enteric contents at lower part of RUQ wound.  Looks like two drains in small bowel.  Right lateral abdominal drain.  Drain 1/2/bulb - 0/500/10 cc last 24 hours   Lab Results:     Recent Labs  06/13/17 0430 06/14/17 0400  WBC 10.2 10.9*  HGB 8.2* 8.6*  HCT 25.5* 26.7*  PLT 430* 433*    BMET    Recent Labs  06/13/17 0430 06/14/17 0400  NA 140 137  K 3.3* 3.8  CL 106 106  CO2 26 25  GLUCOSE 126* 108*  BUN 22* 23*  CREATININE 0.69 0.63  CALCIUM 8.8* 8.8*    PT/INR  No results for input(s): LABPROT, INR in the last 72 hours.  ABG  No results for input(s): PHART, HCO3 in the last 72 hours.  Invalid input(s): PCO2, PO2   Studies/Results:  No results found.   Anti-infectives:   Anti-infectives    Start     Dose/Rate Route Frequency Ordered Stop   06/08/17 1200  piperacillin-tazobactam (  ZOSYN) IVPB 3.375 g     3.375 g 12.5 mL/hr over 240 Minutes Intravenous Every 8 hours 06/08/17 1056     05/29/17 2200  ceFEPIme (MAXIPIME) 2 g in dextrose 5 % 50 mL IVPB  Status:  Discontinued     2 g 100 mL/hr over 30 Minutes Intravenous Every 8 hours 05/29/17 2031 06/03/17 0832   05/29/17 2100  metroNIDAZOLE (FLAGYL) IVPB 500 mg  Status:  Discontinued     500 mg 100 mL/hr over 60 Minutes Intravenous Every 8 hours 05/29/17 2029 06/03/17 0832   05/26/17 2200  ceFAZolin (ANCEF) IVPB 1 g/50 mL premix  Status:  Discontinued     1 g 100 mL/hr over 30 Minutes Intravenous Every 8 hours 05/26/17 1008 05/29/17 2023   05/25/17 1100  vancomycin (VANCOCIN) 1,250 mg in sodium chloride 0.9 % 250 mL IVPB  Status:  Discontinued     1,250 mg 166.7 mL/hr over 90 Minutes Intravenous Every 12 hours 05/25/17 0955 05/26/17 0946    05/22/17 1400  anidulafungin (ERAXIS) 100 mg in sodium chloride 0.9 % 100 mL IVPB     100 mg 78 mL/hr over 100 Minutes Intravenous Every 24 hours 05/21/17 1330 05/27/17 1740   05/21/17 1400  anidulafungin (ERAXIS) 200 mg in sodium chloride 0.9 % 200 mL IVPB     200 mg 78 mL/hr over 200 Minutes Intravenous  Once 05/21/17 1330 05/21/17 1940   05/15/17 1000  anidulafungin (ERAXIS) 100 mg in sodium chloride 0.9 % 100 mL IVPB  Status:  Discontinued     100 mg 78 mL/hr over 100 Minutes Intravenous Every 24 hours 05/14/17 0829 05/16/17 0901   05/15/17 1000  metroNIDAZOLE (FLAGYL) IVPB 500 mg  Status:  Discontinued     500 mg 100 mL/hr over 60 Minutes Intravenous Every 8 hours 05/15/17 0903 05/25/17 0938   05/14/17 0900  anidulafungin (ERAXIS) 200 mg in sodium chloride 0.9 % 200 mL IVPB     200 mg 78 mL/hr over 200 Minutes Intravenous  Once 05/14/17 0829 05/14/17 1325   05/13/17 1927  vancomycin (VANCOCIN) 1-5 GM/200ML-% IVPB    Comments:  Ward, Christa   : cabinet override      05/13/17 1927 05/14/17 0744   05/12/17 1400  ceFEPIme (MAXIPIME) 1 g in dextrose 5 % 50 mL IVPB     1 g 100 mL/hr over 30 Minutes Intravenous Every 8 hours 05/12/17 0939 05/26/17 2359   05/12/17 0900  vancomycin (VANCOCIN) IVPB 1000 mg/200 mL premix  Status:  Discontinued     1,000 mg 200 mL/hr over 60 Minutes Intravenous Every 12 hours 05/12/17 0750 05/16/17 0852   05/12/17 0830  aztreonam (AZACTAM) 2 GM IVPB     2 g 100 mL/hr over 30 Minutes Intravenous  Once 05/12/17 0800 05/12/17 0957   05/06/17 0916  vancomycin (VANCOCIN) IVPB 1000 mg/200 mL premix     1,000 mg 200 mL/hr over 60 Minutes Intravenous On call to O.R. 05/06/17 0916 05/06/17 1229      Alphonsa Overall, MD, FACS Pager: [facility] Office: [phone] 06/14/2017

## 2017-06-14 NOTE — Progress Notes (Signed)
[place] NOTE   Pharmacy Consult for TPN Indication: prolonged ileus, small bowel leak, multiple bowel surgeries  Patient Measurements: Body mass index is 25.75 kg/m. Filed Weights   06/11/17 0700 06/13/17 0606 06/14/17 0500  Weight: 176 lb 12.9 oz (80.2 kg) 180 lb 12.4 oz (82 kg) 179 lb 7.3 oz (81.4 kg)   HPI: 52 yoM admitted on 7/11 for enlarging adrenal mass and planned adrenalectomy.  Pharmacy consulted to dose TPN.  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   7/22 OR:  wash out, tube ileostomy, left with open abdomen & wound vac in place.  Plan for OR on 7/25 for closure 7/25 OR: mesh placed for abdominal closure, wound vac 8/2 extubated, propofol drip off 8/4 TPN off for ~ 5 hours due to line detached from filter 8/10 MD request decrease rate x 1 week 2nd low sodium 8/12 Additional sodium added to TPN due to persistent hyponatremia 8/16 Increasing rate back to goal since sodium has improved with above intervention.    Insulin requirements past 24 hours: 2 units SSI (no hx DM)  Current Nutrition: NPO, ice chips  IVF: none  Central access: PICC line ordered 7/19, placed on 7/20 TPN start date: 7/20  ASSESSMENT                                                                                                           Today, 06/14/17  - Glucose: at goal (goal < 150) - Electrolytes:  Na now normal at 138 with standard formulation TPN: now with a total of ~ 70 meq Na/L  Phos elevated, CoCa 10.16, CaxPhos = ~48.7  goal < 55   K 3.8 (improved) after 3 runs  yesterday  Mag 1.6 (unchanged after 2g Mag sulfate yesterday) - Renal:  SCr wnl, stable. recorded I/O: +1500,   NGO 34 - Weight:81.4,  Was 93.3kg on 7/31, 89.4kg on hospital admission - GI: VAC removed, drains in - LFTs: all WNL  - TGs:  296 (7/18), 266 (7/20), 269 (7/23), 210 (7/28), 285 (7/31) 301 (8/3), increased 323 on 8/14  - Prealbumin:  <5 (7/20), 6.1 (7/24), 10.1 (7/30) 24.8 (8/6), now normal 26.4 (8/13)  NUTRITIONAL GOALS  RD recs (8/10):  122-138 gms Protein (1.5 - 1.7 g/kg) 2030 - 2270 Kcal  Recalculation: Clinimix 5/15 at rate of 115 ml/hr with lipids 3x/week on MWF only would provide: 138g protein and avg 2164 kcal/day  - Glucose infusion rate will be 3.53 mg/kg/min (Maximum 5 mg/kg/min)   PLAN                                                                                                                         Now: Magnesium sulfate 4g IV x 1   At 1800 today: ? Change to Clinimix E 5/15 at 115 ml/hr. (E formulation includes 70 mEq of Na). Continue to monitor calcium phosphorus product and consider removal of electrolytes if rises > 55. ? Lipids at 63mls/hr x 12 hours 3 times weekly on MWF  TPN to contain standard multivitamins and trace elements daily  No maintenance IVF per MD  CBGs and moderate SSI q6h.  TPN lab panels on Mondays & Thursdays.  Check BMET, Mag & Phos in AM  Peggyann Juba, PharmD, BCPS Pager: [phone] 06/14/2017 7:05 AM

## 2017-06-14 NOTE — Progress Notes (Signed)
Pharmacy Antibiotic Note  [name] [name] is a 56 y.o. male admitted on 05/06/2017.  Pharmacy has been consulted for  Dosing for possible wound infection.  Today, 06/14/2017 Day #7 Zosyn Afebrile WBC 10.9, decreased SCr 0.63, stable CrCl 100 ml/min  Plan: Continue Zosyn 3.375gm IV q8h (4hr extended infusions) Per MD, planning last day of abx 8/21  Height: 5\' 10"  (177.8 cm) Weight: 179 lb 7.3 oz (81.4 kg) IBW/kg (Calculated) : 73  Temp (24hrs), Avg:98.4 F (36.9 C), Min:97.5 F (36.4 C), Max:99.1 F (37.3 C)   Recent Labs Lab 06/10/17 0603 06/11/17 0526 06/11/17 0804 06/12/17 0607 06/13/17 0430 06/14/17 0400  WBC 13.0*  --  12.2* 11.1* 10.2 10.9*  CREATININE 0.95 0.87  --  0.76 0.69 0.63    Estimated Creatinine Clearance: 106.5 mL/min (by C-G formula based on SCr of 0.63 mg/dL).    Allergies  Allergen Reactions  . Chantix [Varenicline] Palpitations    Heart race Increased panic attacks  . Morphine Itching  . Penicillins Rash    Has patient had a PCN reaction causing immediate rash, facial/tongue/throat swelling, SOB or lightheadedness with hypotension: Yes Has patient had a PCN reaction causing severe rash involving mucus membranes or skin necrosis: Unknown Has patient had a PCN reaction that required hospitalization: No Has patient had a PCN reaction occurring within the last 10 years: No If all of the above answers are "NO", then may proceed with Cephalosporin use. Tolerating Zosyn 05/2017     Antimicrobials this admission: 7/17 Vancomcyin >> 7/21, resume 7/30 >> 7/31 7/17 Cefepime >> 7/31, resume 8/3 >> 8/8 7/19 Anidulafungin >> 7/21, resume 7/26 >>  7/20 metronidazole >> 7/30, resume 8/3 >> 8/8 7/31 Cefazolin >> 8/3 8/13 Zosyn >>  Dose adjustments this admission:  Microbiology results: 7/11 MRSA PCR: negative 7/18 Abdominal culture:  moderate Klebsiella pna - Pan sens except amp 7/19 BCx: NGF 7/22 Trach asp: multple orgs, none predominant 7/27  Trach asp: rare MSSA 7/27 BCx: 1/2 CoNS 8/4 perihepatic fluid: NGF 8/16 MRSA PCR: negative  Thank you for allowing pharmacy to be a part of this patient's care.  Peggyann Juba, PharmD, BCPS Pager: [phone] 06/14/2017 7:40 AM

## 2017-06-15 ENCOUNTER — Inpatient Hospital Stay (HOSPITAL_COMMUNITY): Payer: Medicare Other

## 2017-06-15 LAB — CBC WITH DIFFERENTIAL/PLATELET
BASOS ABS: 0 10*3/uL (ref 0.0–0.1)
Basophils Relative: 0 %
EOS ABS: 0.7 10*3/uL (ref 0.0–0.7)
EOS PCT: 6 %
HCT: 26.8 % — ABNORMAL LOW (ref 39.0–52.0)
HEMOGLOBIN: 8.8 g/dL — AB (ref 13.0–17.0)
LYMPHS ABS: 4.4 10*3/uL — AB (ref 0.7–4.0)
LYMPHS PCT: 40 %
MCH: 25.8 pg — ABNORMAL LOW (ref 26.0–34.0)
MCHC: 32.8 g/dL (ref 30.0–36.0)
MCV: 78.6 fL (ref 78.0–100.0)
Monocytes Absolute: 0.8 10*3/uL (ref 0.1–1.0)
Monocytes Relative: 7 %
NEUTROS PCT: 47 %
Neutro Abs: 5.1 10*3/uL (ref 1.7–7.7)
PLATELETS: 452 10*3/uL — AB (ref 150–400)
RBC: 3.41 MIL/uL — AB (ref 4.22–5.81)
RDW: 16.4 % — ABNORMAL HIGH (ref 11.5–15.5)
WBC: 11 10*3/uL — AB (ref 4.0–10.5)

## 2017-06-15 LAB — MAGNESIUM: Magnesium: 1.8 mg/dL (ref 1.7–2.4)

## 2017-06-15 LAB — GLUCOSE, CAPILLARY
GLUCOSE-CAPILLARY: 120 mg/dL — AB (ref 65–99)
GLUCOSE-CAPILLARY: 115 mg/dL — AB (ref 65–99)
GLUCOSE-CAPILLARY: 114 mg/dL — AB (ref 65–99)
GLUCOSE-CAPILLARY: 110 mg/dL — AB (ref 65–99)
Glucose-Capillary: 126 mg/dL — ABNORMAL HIGH (ref 65–99)

## 2017-06-15 LAB — TRIGLYCERIDES: Triglycerides: 238 mg/dL — ABNORMAL HIGH (ref <150)

## 2017-06-15 LAB — COMPREHENSIVE METABOLIC PANEL
ALK PHOS: 139 U/L — AB (ref 38–126)
ALT: 19 U/L (ref 17–63)
AST: 20 U/L (ref 15–41)
Albumin: 2.2 g/dL — ABNORMAL LOW (ref 3.5–5.0)
Anion gap: 7 (ref 5–15)
BUN: 21 mg/dL — AB (ref 6–20)
CALCIUM: 8.9 mg/dL (ref 8.9–10.3)
CHLORIDE: 103 mmol/L (ref 101–111)
CO2: 25 mmol/L (ref 22–32)
CREATININE: 0.7 mg/dL (ref 0.61–1.24)
GFR calc Af Amer: >60 mL/min (ref >60)
GFR calc non Af Amer: >60 mL/min (ref >60)
Glucose, Bld: 94 mg/dL (ref 65–99)
Potassium: 4.2 mmol/L (ref 3.5–5.1)
SODIUM: 135 mmol/L (ref 135–145)
Total Bilirubin: 1.2 mg/dL (ref 0.3–1.2)
Total Protein: 6.9 g/dL (ref 6.5–8.1)

## 2017-06-15 LAB — PREALBUMIN: Prealbumin: 15.9 mg/dL — ABNORMAL LOW (ref 18–38)

## 2017-06-15 LAB — PHOSPHORUS: Phosphorus: 5 mg/dL — ABNORMAL HIGH (ref 2.5–4.6)

## 2017-06-15 MED ORDER — TRACE MINERALS CR-CU-MN-SE-ZN 10-1000-500-60 MCG/ML IV SOLN
INTRAVENOUS | Status: AC
  Administered 2017-06-15: 17:00:00 via INTRAVENOUS
  Filled 2017-06-15: qty 2000
  Filled 2017-06-15: qty 2760

## 2017-06-15 MED ORDER — FAT EMULSION 20 % IV EMUL
240.0000 mL | INTRAVENOUS | Status: AC
  Administered 2017-06-15: 240 mL via INTRAVENOUS
  Filled 2017-06-15: qty 250

## 2017-06-15 MED ORDER — MENTHOL 3 MG MT LOZG
1.0000 | LOZENGE | OROMUCOSAL | Status: DC | PRN
  Filled 2017-06-15 (×2): qty 9

## 2017-06-15 NOTE — Progress Notes (Signed)
Patient ID: [name] [name], male   DOB: 1961-05-01, 56 y.o.   MRN: [id number] [facility] Surgery Progress Note:   26 Days Post-Op  Subjective: Mental status is clear.  We discussed his situation.  Objective: Vital signs in last 24 hours: Temp:  [97.5 F (36.4 C)-98.7 F (37.1 C)] 97.5 F (36.4 C) (08/20 1317) Pulse Rate:  [88-97] 92 (08/20 1200) Resp:  [12-24] 22 (08/20 1200) BP: (104-138)/(70-84) 119/77 (08/20 1200) SpO2:  [94 %-100 %] 98 % (08/20 1200) Weight:  [81.9 kg (180 lb 8.9 oz)] 81.9 kg (180 lb 8.9 oz) (08/20 0403)  Intake/Output from previous day: 08/19 0701 - 08/20 0700 In: 3697.9 [I.V.:3447.9; NG/GT:10; IV Piggyback:200] Out: 2683 [Urine:1700; Emesis/NG output:200; Drains:1130] Intake/Output this shift: Total I/O In: 865.4 [I.V.:865.4] Out: -   Physical Exam: Work of breathing is not labored.  No abdominal pain at present.  NG in place without tape.    Lab Results:  Results for orders placed or performed during the hospital encounter of 05/06/17 (from the past 48 hour(s))  Glucose, capillary     Status: Abnormal   Collection Time: 06/13/17  6:30 PM  Result Value Ref Range   Glucose-Capillary 115 (H) 65 - 99 mg/dL  Glucose, capillary     Status: Abnormal   Collection Time: 06/13/17 11:53 PM  Result Value Ref Range   Glucose-Capillary 115 (H) 65 - 99 mg/dL   Comment 1 Notify RN   CBC     Status: Abnormal   Collection Time: 06/14/17  4:00 AM  Result Value Ref Range   WBC 10.9 (H) 4.0 - 10.5 K/uL   RBC 3.32 (L) 4.22 - 5.81 MIL/uL   Hemoglobin 8.6 (L) 13.0 - 17.0 g/dL   HCT 26.7 (L) 39.0 - 52.0 %   MCV 80.4 78.0 - 100.0 fL   MCH 25.9 (L) 26.0 - 34.0 pg   MCHC 32.2 30.0 - 36.0 g/dL   RDW 16.7 (H) 11.5 - 15.5 %   Platelets 433 (H) 150 - 400 K/uL  Comprehensive metabolic panel     Status: Abnormal   Collection Time: 06/14/17  4:00 AM  Result Value Ref Range   Sodium 137 135 - 145 mmol/L   Potassium 3.8 3.5 - 5.1 mmol/L   Chloride 106 101 - 111 mmol/L   CO2 25 22 - 32 mmol/L   Glucose, Bld 108 (H) 65 - 99 mg/dL   BUN 23 (H) 6 - 20 mg/dL   Creatinine, Ser 0.63 0.61 - 1.24 mg/dL   Calcium 8.8 (L) 8.9 - 10.3 mg/dL   Total Protein 6.6 6.5 - 8.1 g/dL   Albumin 2.3 (L) 3.5 - 5.0 g/dL   AST 21 15 - 41 U/L   ALT 18 17 - 63 U/L   Alkaline Phosphatase 123 38 - 126 U/L   Total Bilirubin 1.1 0.3 - 1.2 mg/dL   GFR calc non Af Amer >60 >60 mL/min   GFR calc Af Amer >60 >60 mL/min    Comment: (NOTE) The eGFR has been calculated using the CKD EPI equation. This calculation has not been validated in all clinical situations. eGFR's persistently <60 mL/min signify possible Chronic Kidney Disease.    Anion gap 6 5 - 15  Magnesium     Status: Abnormal   Collection Time: 06/14/17  4:00 AM  Result Value Ref Range   Magnesium 1.6 (L) 1.7 - 2.4 mg/dL  Phosphorus     Status: Abnormal   Collection Time: 06/14/17  4:00 AM  Result Value Ref Range   Phosphorus 4.8 (H) 2.5 - 4.6 mg/dL  Glucose, capillary     Status: Abnormal   Collection Time: 06/14/17  6:16 AM  Result Value Ref Range   Glucose-Capillary 120 (H) 65 - 99 mg/dL  Glucose, capillary     Status: Abnormal   Collection Time: 06/14/17 11:13 AM  Result Value Ref Range   Glucose-Capillary 119 (H) 65 - 99 mg/dL  Glucose, capillary     Status: Abnormal   Collection Time: 06/14/17  5:35 PM  Result Value Ref Range   Glucose-Capillary 125 (H) 65 - 99 mg/dL  Glucose, capillary     Status: Abnormal   Collection Time: 06/14/17 11:58 PM  Result Value Ref Range   Glucose-Capillary 126 (H) 65 - 99 mg/dL   Comment 1 Notify RN    Comment 2 Document in Chart   CBC with Differential/Platelet     Status: Abnormal   Collection Time: 06/15/17  8:09 AM  Result Value Ref Range   WBC 11.0 (H) 4.0 - 10.5 K/uL   RBC 3.41 (L) 4.22 - 5.81 MIL/uL   Hemoglobin 8.8 (L) 13.0 - 17.0 g/dL   HCT 26.8 (L) 39.0 - 52.0 %   MCV 78.6 78.0 - 100.0 fL   MCH 25.8 (L) 26.0 - 34.0 pg   MCHC 32.8 30.0 - 36.0 g/dL   RDW 16.4  (H) 11.5 - 15.5 %   Platelets 452 (H) 150 - 400 K/uL   Neutrophils Relative % 47 %   Neutro Abs 5.1 1.7 - 7.7 K/uL   Lymphocytes Relative 40 %   Lymphs Abs 4.4 (H) 0.7 - 4.0 K/uL   Monocytes Relative 7 %   Monocytes Absolute 0.8 0.1 - 1.0 K/uL   Eosinophils Relative 6 %   Eosinophils Absolute 0.7 0.0 - 0.7 K/uL   Basophils Relative 0 %   Basophils Absolute 0.0 0.0 - 0.1 K/uL  Comprehensive metabolic panel     Status: Abnormal   Collection Time: 06/15/17  8:09 AM  Result Value Ref Range   Sodium 135 135 - 145 mmol/L   Potassium 4.2 3.5 - 5.1 mmol/L   Chloride 103 101 - 111 mmol/L   CO2 25 22 - 32 mmol/L   Glucose, Bld 94 65 - 99 mg/dL   BUN 21 (H) 6 - 20 mg/dL   Creatinine, Ser 0.70 0.61 - 1.24 mg/dL   Calcium 8.9 8.9 - 10.3 mg/dL   Total Protein 6.9 6.5 - 8.1 g/dL   Albumin 2.2 (L) 3.5 - 5.0 g/dL   AST 20 15 - 41 U/L   ALT 19 17 - 63 U/L   Alkaline Phosphatase 139 (H) 38 - 126 U/L   Total Bilirubin 1.2 0.3 - 1.2 mg/dL   GFR calc non Af Amer >60 >60 mL/min   GFR calc Af Amer >60 >60 mL/min    Comment: (NOTE) The eGFR has been calculated using the CKD EPI equation. This calculation has not been validated in all clinical situations. eGFR's persistently <60 mL/min signify possible Chronic Kidney Disease.    Anion gap 7 5 - 15  Magnesium     Status: None   Collection Time: 06/15/17  8:09 AM  Result Value Ref Range   Magnesium 1.8 1.7 - 2.4 mg/dL  Phosphorus     Status: Abnormal   Collection Time: 06/15/17  8:09 AM  Result Value Ref Range   Phosphorus 5.0 (H) 2.5 - 4.6 mg/dL  Glucose, capillary  Status: Abnormal   Collection Time: 06/15/17  1:01 PM  Result Value Ref Range   Glucose-Capillary 115 (H) 65 - 99 mg/dL   Comment 1 Notify RN    Comment 2 Document in Chart     Radiology/Results: Dg Chest Port 1 View  Result Date: 06/15/2017 CLINICAL DATA:  Shortness of breath. EXAM: PORTABLE CHEST 1 VIEW COMPARISON:  05/30/2017. FINDINGS: NG tube noted below the lower  border of the image. Hemidiaphragms are incompletely imaged. Right PICC line tip in stable position at the cavoatrial junction. Heart size normal. Right base subsegmental atelectasis. Mild elevation right hemidiaphragm again noted . No pleural effusion or pneumothorax noted. Cervical spine fusion IMPRESSION: 1. NG tube noted with its tip below the lower border of the image. Hemidiaphragms are incompletely imaged. Right PICC line tip in stable position at the cavoatrial junction. 2. Mild right base subsegmental atelectasis . Mild elevation the right hemidiaphragm again noted . Electronically Signed   By: Marcello Moores  Register   On: 06/15/2017 06:18    Anti-infectives: Anti-infectives    Start     Dose/Rate Route Frequency Ordered Stop   06/08/17 1200  piperacillin-tazobactam (ZOSYN) IVPB 3.375 g     3.375 g 12.5 mL/hr over 240 Minutes Intravenous Every 8 hours 06/08/17 1056     05/29/17 2200  ceFEPIme (MAXIPIME) 2 g in dextrose 5 % 50 mL IVPB  Status:  Discontinued     2 g 100 mL/hr over 30 Minutes Intravenous Every 8 hours 05/29/17 2031 06/03/17 0832   05/29/17 2100  metroNIDAZOLE (FLAGYL) IVPB 500 mg  Status:  Discontinued     500 mg 100 mL/hr over 60 Minutes Intravenous Every 8 hours 05/29/17 2029 06/03/17 0832   05/26/17 2200  ceFAZolin (ANCEF) IVPB 1 g/50 mL premix  Status:  Discontinued     1 g 100 mL/hr over 30 Minutes Intravenous Every 8 hours 05/26/17 1008 05/29/17 2023   05/25/17 1100  vancomycin (VANCOCIN) 1,250 mg in sodium chloride 0.9 % 250 mL IVPB  Status:  Discontinued     1,250 mg 166.7 mL/hr over 90 Minutes Intravenous Every 12 hours 05/25/17 0955 05/26/17 0946   05/22/17 1400  anidulafungin (ERAXIS) 100 mg in sodium chloride 0.9 % 100 mL IVPB     100 mg 78 mL/hr over 100 Minutes Intravenous Every 24 hours 05/21/17 1330 05/27/17 1740   05/21/17 1400  anidulafungin (ERAXIS) 200 mg in sodium chloride 0.9 % 200 mL IVPB     200 mg 78 mL/hr over 200 Minutes Intravenous  Once 05/21/17  1330 05/21/17 1940   05/15/17 1000  anidulafungin (ERAXIS) 100 mg in sodium chloride 0.9 % 100 mL IVPB  Status:  Discontinued     100 mg 78 mL/hr over 100 Minutes Intravenous Every 24 hours 05/14/17 0829 05/16/17 0901   05/15/17 1000  metroNIDAZOLE (FLAGYL) IVPB 500 mg  Status:  Discontinued     500 mg 100 mL/hr over 60 Minutes Intravenous Every 8 hours 05/15/17 0903 05/25/17 0938   05/14/17 0900  anidulafungin (ERAXIS) 200 mg in sodium chloride 0.9 % 200 mL IVPB     200 mg 78 mL/hr over 200 Minutes Intravenous  Once 05/14/17 0829 05/14/17 1325   05/13/17 1927  vancomycin (VANCOCIN) 1-5 GM/200ML-% IVPB    Comments:  Ward, Christa   : cabinet override      05/13/17 1927 05/14/17 0744   05/12/17 1400  ceFEPIme (MAXIPIME) 1 g in dextrose 5 % 50 mL IVPB     1  g 100 mL/hr over 30 Minutes Intravenous Every 8 hours 05/12/17 0939 05/26/17 2359   05/12/17 0900  vancomycin (VANCOCIN) IVPB 1000 mg/200 mL premix  Status:  Discontinued     1,000 mg 200 mL/hr over 60 Minutes Intravenous Every 12 hours 05/12/17 0750 05/16/17 0852   05/12/17 0830  aztreonam (AZACTAM) 2 GM IVPB     2 g 100 mL/hr over 30 Minutes Intravenous  Once 05/12/17 0800 05/12/17 0957   05/06/17 0916  vancomycin (VANCOCIN) IVPB 1000 mg/200 mL premix     1,000 mg 200 mL/hr over 60 Minutes Intravenous On call to O.R. 05/06/17 0916 05/06/17 1229      Assessment/Plan: Problem List: Patient Active Problem List   Diagnosis Date Noted  . Peritonitis ([place]) 06/01/2017  . Pressure injury of skin 05/29/2017  . Mucus plugging of bronchi   . Acute respiratory failure with hypoxemia ([place])   . Ileus ([place])   . Hypokalemia 05/14/2017  . Anastomotic leak of intestine 05/14/2017  . Severe sepsis ([place]) 05/14/2017  . Wound dehiscence, surgical 05/14/2017  . Aspiration pneumonia ([place]) 05/14/2017  . Chronic narcotic use 05/10/2017  . Anxiety state 05/10/2017  . Intra-abdominal adhesions s/p SB resection 05/06/2017 05/09/2017  . H/O total  adrenalectomy ([place]) 05/06/2017  . Right adrenal mass s/p adrenalectomy 05/06/2017 05/06/2017  . Throat symptom 03/18/2016  . Multinodular goiter 01/19/2016  . Hyperthyroidism 01/19/2016  . Essential hypertension   . Diarrhea 11/30/2014  . Anemia, iron deficiency 11/01/2014  . Leukocytosis 11/03/2013  . Tobacco abuse 07/26/2013  . COPD (chronic obstructive pulmonary disease) ([place]) 07/26/2013  . Left knee pain 07/26/2013  . Pain in joint, shoulder region 05/03/2013  . GERD 07/24/2008  . 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  Continue TNA and time.   26 Days Post-Op    LOS: 40 days   Matt B. Hassell Done, MD, [facility] [phone] beeper [phone]  06/15/2017 3:10 PM

## 2017-06-15 NOTE — Progress Notes (Signed)
PHARMACY - ADULT TOTAL PARENTERAL NUTRITION CONSULT NOTE   Pharmacy Consult for TPN Indication: prolonged ileus, small bowel leak, multiple bowel surgeries  Patient Measurements: Body mass index is 25.91 kg/m. Filed Weights   06/13/17 0606 06/14/17 0500 06/15/17 0403  Weight: 180 lb 12.4 oz (82 kg) 179 lb 7.3 oz (81.4 kg) 180 lb 8.9 oz (81.9 kg)   HPI: 87 yoM admitted on 7/11 for enlarging adrenal mass and planned adrenalectomy.  Pharmacy consulted to dose TPN.  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   7/22 OR:  wash out, tube ileostomy, left with open abdomen & wound vac in place.  Plan for OR on 7/25 for closure 7/25 OR: mesh placed for abdominal closure, wound vac 8/2 extubated, propofol drip off 8/4 TPN off for ~ 5 hours due to line detached from filter 8/10 MD request decrease rate x 1 week 2nd low sodium 8/12 Additional sodium added to TPN due to persistent hyponatremia 8/16 Increasing rate back to goal since sodium has improved with above intervention.    Insulin requirements past 24 hours: 2 units SSI (no hx DM)  Current Nutrition: NPO, ice chips  IVF: NS at 10 ml/hr  Central access: PICC line ordered 7/19, placed on 7/20 TPN start date: 7/20  ASSESSMENT                                                                                                           Today, 06/15/17  - Glucose: at goal (goal < 150) - Electrolytes:  Na normal at 135 with standard formulation TPN: now with a total of ~ 70 meq Na/L. However has been trending down    Phos elevated, CoCa 10.3, CaxPhos = ~51.5 trending up   goal  < 55   K 4.2 WNL  Mag 1.8 WNL - Renal:  SCr wnl, stable. recorded I/O: +1500,   NGO 50 - Weight:81.9 kg,  Was 93.3kg on 7/31, 89.4kg on hospital admission - GI: VAC removed, drains in - LFTs: all WNL  - TGs:  296 (7/18), 266 (7/20), 269 (7/23), 210 (7/28), 285 (7/31) 301 (8/3), increased 323 on 8/14  - Prealbumin:  <5 (7/20), 6.1 (7/24), 10.1 (7/30) 24.8 (8/6), now normal 26.4 (8/13)  NUTRITIONAL GOALS  RD recs (8/10):  122-138 gms Protein (1.5 - 1.7 g/kg) 2030 - 2270 Kcal  Recalculation: Clinimix 5/15 at rate of 115 ml/hr with lipids 3x/week on MWF only would provide: 138g protein and avg 2164 kcal/day  - Glucose infusion rate will be 3.53 mg/kg/min (Maximum 5 mg/kg/min)   PLAN                                                                                                                           At 1800 today: ? Continue Clinimix E 5/15 at 115 ml/hr. (E formulation includes 70 mEq of Na).  ? Continue to monitor calcium phosphorus product and consider removal of electrolytes if rises > 55. ? Lipids at 54mls/hr x 12 hours 3 times weekly on MWF  TPN to contain standard multivitamins and trace elements daily  Maintenance IVF NS at 10 ml/hr  CBGs and moderate SSI q6h.  TPN lab panels on Mondays & Thursdays.  Check BMET, Mag & Phos in AM   Royetta Asal, PharmD, BCPS Pager [phone] 06/15/2017 9:47 AM

## 2017-06-15 NOTE — Care Management Note (Signed)
Case Management Note  Patient Details  Name: [name] [name] MRN: [id number] Date of Birth: [birth year]  Subjective/Objective:  Wound vac/iv abx/recent ct scan=extraluminal stool in the rt lower quad of the abd. wound                  Action/Plan: Date:  June 15, 2017 Chart reviewed for concurrent status and case management needs. Will continue to follow patient progress. Discharge Planning: following for needs Expected discharge date: [id number] [name], BSN, [place], [place]  Expected Discharge Date:                  Expected Discharge Plan:  Home/Self Care  In-House Referral:     Discharge planning Services  CM Consult  Post Acute Care Choice:    Choice offered to:     DME Arranged:    DME Agency:     HH Arranged:    HH Agency:     Status of Service:  In process, will continue to follow  If discussed at Long Length of Stay Meetings, dates discussed:    Additional Comments:  Leeroy Cha, RN 06/15/2017, 9:09 AM

## 2017-06-15 NOTE — Consult Note (Signed)
[place] Nurse wound follow up Wound type: Routine change of NPWT Measurement: per Friday Wound bed:  75% red, 25% with brown/feculent material.  Peripheral edges of mesh appear to be lifting. Drainage (amount, consistency, odor) See above. Periwound:IUntact with closed edges from 3-9 o'clock and from 10-2 o'clock. Dressing procedure/placement/frequency: Spray releaser used to remove drape for patient's comfort. Wound contact layers removed: 1 piece white foam and 2 pieces of black foam.  Wound bed cleansed. One piece of white foam applied to wound bed, two pieces of black foam used to obliterate dead space. Drape is applied and an immediate seal is achieved.  I am assisted in the dressing change today by the patient's bedside RN.  Patient's wife is present for dressing change. Next dressing change is due on 06/17/17.  Supplies in room. [place] nursing team will  Follow, and will remain available to this patient, the nursing, surgical and medical teams.  Thanks, Maudie Flakes, MSN, RN, [place], Arther Abbott  Pager# [phone]

## 2017-06-15 NOTE — Progress Notes (Signed)
Physical Therapy Treatment Patient Details Name: [name] [name] MRN: [id number] DOB: 17-Dec-1960 Today's Date: 06/15/2017    History of Present Illness  56 y.o. 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 On 7/18, he developed a leak therefore was taken back to OR for re-do of ex-lap and repair of leak on 05/20/17.  He returned to the ICU on the vent . Extubated on 05/28/17.    PT Comments    Pt assisted to sitting EOB and had increased abdominal pain which was unrelieved by PCA.  RN notified.  Pt requested return to supine.  Pt's spouse reports nursing ambulated with pt around unit yesterday.  Follow Up Recommendations  SNF;Supervision/Assistance - 24 hour     Equipment Recommendations  Rolling walker with 5" wheels    Recommendations for Other Services       Precautions / Restrictions Precautions Precautions: Fall Precaution Comments: multiple lines in abdomen, VAC, NG, and JP drain    Mobility  Bed Mobility Overal bed mobility: Needs Assistance Bed Mobility: Supine to Sit;Sit to Supine   Sidelying to sit: Min guard;+2 for safety/equipment   Sit to supine: Mod assist   General bed mobility comments: min/guard for lines, increased abdominal pain with sitting, pt hit PCA button but this did not relieve pain and pt requested return to supine, assist for LEs onto bed due to pain  Transfers                    Ambulation/Gait                 Stairs            Wheelchair Mobility    Modified Rankin (Stroke Patients Only)       Balance                                            Cognition Arousal/Alertness: Awake/alert Behavior During Therapy: WFL for tasks assessed/performed Overall Cognitive Status: Within Functional Limits for tasks assessed                                         Exercises      General Comments        Pertinent Vitals/Pain Pain Assessment: Faces Faces Pain Scale: Hurts whole lot Pain Location: abdomen Pain Descriptors / Indicators: Grimacing;Sore Pain Intervention(s): Repositioned;Limited activity within patient's tolerance;Monitored during session;PCA encouraged    Home Living                      Prior Function            PT Goals (current goals can now be found in the care plan section) Acute Rehab PT Goals PT Goal Formulation: With patient/family Time For Goal Achievement: 06/29/17 Potential to Achieve Goals: Fair Progress towards PT goals: Not progressing toward goals - comment (limited by pain today)    Frequency    Min 3X/week      PT Plan Current plan remains appropriate    Co-evaluation              AM-PAC PT "  6 Clicks" Daily Activity  Outcome Measure  Difficulty turning over in bed (including adjusting bedclothes, sheets and blankets)?: A Little Difficulty moving from lying on back to sitting on the side of the bed? : Unable Difficulty sitting down on and standing up from a chair with arms (e.g., wheelchair, bedside commode, etc,.)?: Unable Help needed moving to and from a bed to chair (including a wheelchair)?: Total Help needed walking in hospital room?: Total Help needed climbing 3-5 steps with a railing? : Total 6 Click Score: 8    End of Session   Activity Tolerance: Patient tolerated treatment well Patient left: with call bell/phone within reach;with family/visitor present;in bed Nurse Communication: Mobility status PT Visit Diagnosis: Difficulty in walking, not elsewhere classified (R26.2);Muscle weakness (generalized) (M62.81)     Time: 3202-3343 PT Time Calculation (min) (ACUTE ONLY): 21 min  Charges:  $Therapeutic Activity: 8-22 mins                    G Codes:       Carmelia Bake, PT, DPT 06/15/2017 Pager:  [phone]  York Ram E 06/15/2017, 12:48 PM

## 2017-06-15 NOTE — Progress Notes (Signed)
[facility] Consultation Progress Note  Patient: [name] [name]:[id number]   PCP: Lujean Amel, MD DOB: 1961-08-09   DOA: 05/06/2017   DOS: 06/15/2017   Date of Service: the patient was seen and examined on 06/15/2017 Primary service: [name], Arta Bruce, MD   Subjective: headache comes and go, no fever, no chest pain. Cough is better.   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 Patient's course was complicated by small bowel injury requiring resection with anastomoses and placement of wound VAC. On 7/18, patient developed leak and was taken back to the operating room for redo of his exploratory laparotomy and repair of leak. That time, patient returned to the ICU on the ventilator. Critical care was consulted for ventilator/medical management.  07/19 - NGT pulled out and almost self-extubated. Developed cuff leak >> tube exchanged. Back to OR emergently for intestinal leak/ischemic bowel.TPN started at that time 07/22 - Back to OR for wash out >> left with open abdomen & wound vac in place. Transfused 1u PRBC. 07/25 - rising temp, WBC increased. Returned to OR for closure 07/27 - Recultured, sedation down on precedex [facility] 7/27 >> MRSE 1/2 > contaminant ? 07/30 - Remains on sedation, VAC leak over the weekend with dressing change by [facility] 07/31 - Initial attempt at weaning failed to due to tachypnea/anxiety 08/02 - Fever 101.1, WBC 14.2, weaned / extubated 08/03 - O2 weaned to 1L. Remains on dilaudid, initially on gtt now PCA. RN reports precedex started overnight for anxiety. 08/04 - Ultrasound-guided aspiration By interventional radiology of small perihepatic fluid collection  yielded only 1 mL of the fluid, sent for culture. No growth yet 08/06 - patient improving. Off Precedex drip since 8/4. Still on TPN 08/08 - wound vac changed per surgery. WBC 16. Abx d/c per surgery  08/13 - Patient with increased tachycardia and developed a fever.  Wound VAC noted with feculent material.  Started on IV Zosyn 08/14 - CT abdomen pelvis shows extraluminal stool in the right lower quadrant, surgery expects conservative management.  Currently further plan is continue IV antibiotics transition to oral when stable.  Assessment and Plan: 1. Sepsis- resolved now due to peritonitis and MSSA HCAP.   white count has been overall stable in the last couple of weeks, and antibiotics were discontinued by general surgery on 8/8.   Zosyn had to be restarted however on 8/13 due to concern for recurrent sepsis given fever and elevated heart rate. Heart rate as well as leukocytosis getting better. Patient primarily have leaking bowel, will defer to surgery for further management for definitive treatment, currently plan is conservative management with frequent wound VAC dressing changes.  At present we'll continue IV antibiotics for a total of 7 day from clinical improvement, last day for antibiotic 06/16/2017.  2. Peritonitis/intestinal perforation/anastomotic leak - 05/29/2017 CT abdomen and pelvis thick walled fluid collection along anterior and lateral aspects of the liver; extends 20 cm along anterior and lateral margins of liver.   Interventional radiology was consulted and on 05/30/2017 he underwent aspiration.  Cultures have remained negative. 05/20/17--ABDOMINAL WOUND [place] OUT, WOUND CLOSURE AND WOUND VAC PLACEMENT.  NG remains in place, patient will need to be on TPN for quite some time  per general surgery Noted from surgery note"may need repeat ct abdomen today." Will watch the results.   3. Acute respiratory failure with hypoxia -resolved Secondary to sepsis and HCAP post op  adrenalectomy.  Currently he is stable on nasal cannula.  He was intubated and has been extubated since 05/28/2017.  Continue DuoNeb's. Repeat chest x-ray shows no pneumonia, pleural effusion or pneumothorax.   4. Sinus tachycardia - ongoing, likely related to peritonitis - 2D echo obtained 8/13 showed an EF of 55-60% and grade 1 diastolic dysfunction  5. Acute postoperative blood loss anemia. Hemoglobin slowly trending downwards over last few days, initial drop was due to postoperative blood loss anemia. Current downward trend is most likely dilutional in the setting of receiving TPN as the patient is a 11 L positive. Patient is getting one PRBC due to being symptomatic. Will monitor.  There is some concerns for coffee-ground return in his NG tube therefore he is on SCDs and his Lovenox was on hold. Resume now that his Hb is stable.  On Protonix injectable twice a day as well as Pepcid added.  6. Hyponatremia, hypomagnesemia, hypokalemia, hyperphosphatemia -Suspect due to TPN, -TSH normal - Daily monitoring and correction  7 MSSA HCAP  finished 7 days of cefepime, afebrile, stable respiratory status Continue flutter device as well as incentive spirometer.  8. R-adrenal mass/pheochromocytoma  -s/p R-adrenalectomy, surgery was complicated by small bowel injury in the setting of adhesions with sepsis secondary to intra-abdominal infection Blood pressure remained stable after the surgery.  9. Acute metabolic encephalopathy -multifactorial including sepsis, infection, electrolyte derangement, opioids/benzos.  Stable  10 Thrombocytosis -likely acute phase reactant and possible iron deficiency -pt had normal platelet count at time of admission  11. Essential Hypertension -remains stable off home clonidine, labetalol, chlorthalidone, valsartan  12. Chronic pain syndrome/Post-op pain -had significant post-op pain -remains on dilaudid PCA, po oxycodone scheduled, ativan  scheduled -pain management per [facility] -previously on home gaba, percocet  13. Hyperlipidemia -remain off lipitor until able to tolerate po reliably  14 Tobacco abuse -nicoderm patch  15 Hyperglycemia -likely stress induced and TPN -A1C was 5.6, not a diabetic -continue novolog sliding scale  Diet: NPO on TPN DVT Prophylaxis: SCD  Family Communication: family was present at bedside, at the time of interview. The pt provided permission to discuss medical plan with the family. Opportunity was given to ask question and all questions were answered satisfactorily.   Disposition: We will continue to follow the patient.  Can resume DVT PX if Primary team wants.   Recommendation on discharge: To be determined.   Other Consultants: Wound care, critical care Procedures: Echocardiogram, rest per progress note Antibiotics: Anti-infectives    Start     Dose/Rate Route Frequency Ordered Stop   06/08/17 1200  piperacillin-tazobactam (ZOSYN) IVPB 3.375 g     3.375 g 12.5 mL/hr over 240 Minutes Intravenous Every 8 hours 06/08/17 1056     05/29/17 2200  ceFEPIme (MAXIPIME) 2 g in dextrose 5 % 50 mL IVPB  Status:  Discontinued     2 g 100 mL/hr over 30 Minutes Intravenous Every 8 hours 05/29/17 2031 06/03/17 0832   05/29/17 2100  metroNIDAZOLE (FLAGYL) IVPB 500 mg  Status:  Discontinued     500 mg 100 mL/hr over 60 Minutes Intravenous Every 8 hours 05/29/17 2029 06/03/17 0832   05/26/17 2200  ceFAZolin (ANCEF) IVPB 1 g/50 mL premix  Status:  Discontinued     1 g 100 mL/hr over 30 Minutes Intravenous  Every 8 hours 05/26/17 1008 05/29/17 2023   05/25/17 1100  vancomycin (VANCOCIN) 1,250 mg in sodium chloride 0.9 % 250 mL IVPB  Status:  Discontinued     1,250 mg 166.7 mL/hr over 90 Minutes Intravenous Every 12 hours 05/25/17 0955 05/26/17 0946   05/22/17 1400  anidulafungin (ERAXIS) 100 mg in sodium chloride 0.9 % 100 mL IVPB     100 mg 78 mL/hr over 100 Minutes Intravenous Every 24 hours  05/21/17 1330 05/27/17 1740   05/21/17 1400  anidulafungin (ERAXIS) 200 mg in sodium chloride 0.9 % 200 mL IVPB     200 mg 78 mL/hr over 200 Minutes Intravenous  Once 05/21/17 1330 05/21/17 1940   05/15/17 1000  anidulafungin (ERAXIS) 100 mg in sodium chloride 0.9 % 100 mL IVPB  Status:  Discontinued     100 mg 78 mL/hr over 100 Minutes Intravenous Every 24 hours 05/14/17 0829 05/16/17 0901   05/15/17 1000  metroNIDAZOLE (FLAGYL) IVPB 500 mg  Status:  Discontinued     500 mg 100 mL/hr over 60 Minutes Intravenous Every 8 hours 05/15/17 0903 05/25/17 0938   05/14/17 0900  anidulafungin (ERAXIS) 200 mg in sodium chloride 0.9 % 200 mL IVPB     200 mg 78 mL/hr over 200 Minutes Intravenous  Once 05/14/17 0829 05/14/17 1325   05/13/17 1927  vancomycin (VANCOCIN) 1-5 GM/200ML-% IVPB    Comments:  Ward, Christa   : cabinet override      05/13/17 1927 05/14/17 0744   05/12/17 1400  ceFEPIme (MAXIPIME) 1 g in dextrose 5 % 50 mL IVPB     1 g 100 mL/hr over 30 Minutes Intravenous Every 8 hours 05/12/17 0939 05/26/17 2359   05/12/17 0900  vancomycin (VANCOCIN) IVPB 1000 mg/200 mL premix  Status:  Discontinued     1,000 mg 200 mL/hr over 60 Minutes Intravenous Every 12 hours 05/12/17 0750 05/16/17 0852   05/12/17 0830  aztreonam (AZACTAM) 2 GM IVPB     2 g 100 mL/hr over 30 Minutes Intravenous  Once 05/12/17 0800 05/12/17 0957   05/06/17 0916  vancomycin (VANCOCIN) IVPB 1000 mg/200 mL premix     1,000 mg 200 mL/hr over 60 Minutes Intravenous On call to O.R. 05/06/17 0916 05/06/17 1229      Objective: Physical Exam: Vitals:   06/15/17 0837 06/15/17 0943 06/15/17 1200 06/15/17 1317  BP:   119/77   Pulse:   92   Resp:  20 (!) 22   Temp:    (!) 97.5 F (36.4 C)  TempSrc:      SpO2: 96% 95% 98%   Weight:      Height:        Intake/Output Summary (Last 24 hours) at 06/15/17 1501 Last data filed at 06/15/17 1300  Gross per 24 hour  Intake          4563.33 ml  Output             2530 ml   Net          2033.33 ml   Filed Weights   06/13/17 0606 06/14/17 0500 06/15/17 0403  Weight: 82 kg (180 lb 12.4 oz) 81.4 kg (179 lb 7.3 oz) 81.9 kg (180 lb 8.9 oz)   General: Alert, Awake and Oriented to Time, Place and Person. Appear in mild distress, affect flat Eyes: PERRL, Conjunctiva normal ENT: Oral Mucosa clear moist. Cardiovascular: S1 and S2 Present, no Murmur, Respiratory: normal respiratory effort, Bilateral Air entry equal and  Decreased, no use of accessory muscle, faint basal Crackles, no wheezes Abdomen: Bowel Sound present, Soft and mild diffuse tenderness, Extremities: trace Pedal edema, no calf tenderness Neurologic: Grossly no focal neuro deficit. Bilaterally Equal motor strength   Data Reviewed: CBC:  Recent Labs Lab 06/11/17 0804 06/11/17 2023 06/12/17 0607 06/13/17 0430 06/14/17 0400 06/15/17 0809  WBC 12.2*  --  11.1* 10.2 10.9* 11.0*  NEUTROABS 7.1  --   --   --   --  5.1  HGB 7.7* 8.8* 8.8* 8.2* 8.6* 8.8*  HCT 24.2* 27.0* 27.4* 25.5* 26.7* 26.8*  MCV 80.9  --  79.9 85.3 80.4 78.6  PLT 503*  --  453* 430* 433* 505*   Basic Metabolic Panel:  Recent Labs Lab 06/11/17 0526 06/12/17 0607 06/13/17 0430 06/14/17 0400 06/15/17 0809  NA 142 142 140 137 135  K 3.4* 3.6 3.3* 3.8 4.2  CL 101 106 106 106 103  CO2 34* 30 26 25 25   GLUCOSE 110* 98 126* 108* 94  BUN 24* 23* 22* 23* 21*  CREATININE 0.87 0.76 0.69 0.63 0.70  CALCIUM 9.1 9.1 8.8* 8.8* 8.9  MG 1.8 1.4* 1.6* 1.6* 1.8  PHOS 5.0* 4.2 5.0* 4.8* 5.0*   Liver Function Tests:  Recent Labs Lab 06/10/17 0603 06/11/17 0526 06/12/17 0607 06/14/17 0400 06/15/17 0809  AST 23 27 23 21 20   ALT 21 21 18 18 19   ALKPHOS 174* 165* 145* 123 139*  BILITOT 0.6 0.6 0.8 1.1 1.2  PROT 7.1 7.0 6.8 6.6 6.9  ALBUMIN 2.5* 2.4* 2.4* 2.3* 2.2*   No results for input(s): LIPASE, AMYLASE in the last 168 hours. No results for input(s): AMMONIA in the last 168 hours. Cardiac Enzymes: No results for  input(s): CKTOTAL, CKMB, CKMBINDEX, TROPONINI in the last 168 hours. BNP (last 3 results) No results for input(s): BNP in the last 8760 hours. CBG:  Recent Labs Lab 06/14/17 0616 06/14/17 1113 06/14/17 1735 06/14/17 2358 06/15/17 1301  GLUCAP 120* 119* 125* 126* 115*   Recent Results (from the past 240 hour(s))  MRSA PCR Screening     Status: None   Collection Time: 06/11/17  5:59 PM  Result Value Ref Range Status   MRSA by PCR NEGATIVE NEGATIVE Final    Comment:        The GeneXpert MRSA Assay (FDA approved for NASAL specimens only), is one component of a comprehensive MRSA colonization surveillance program. It is not intended to diagnose MRSA infection nor to guide or monitor treatment for MRSA infections.     Studies: Dg Chest Port 1 View  Result Date: 06/15/2017 CLINICAL DATA:  Shortness of breath. EXAM: PORTABLE CHEST 1 VIEW COMPARISON:  05/30/2017. FINDINGS: NG tube noted below the lower border of the image. Hemidiaphragms are incompletely imaged. Right PICC line tip in stable position at the cavoatrial junction. Heart size normal. Right base subsegmental atelectasis. Mild elevation right hemidiaphragm again noted . No pleural effusion or pneumothorax noted. Cervical spine fusion IMPRESSION: 1. NG tube noted with its tip below the lower border of the image. Hemidiaphragms are incompletely imaged. Right PICC line tip in stable position at the cavoatrial junction. 2. Mild right base subsegmental atelectasis . Mild elevation the right hemidiaphragm again noted . Electronically Signed   By: [place]   On: 06/15/2017 06:18     Scheduled Meds: . chlorhexidine  15 mL Mouth Rinse BID  . Chlorhexidine Gluconate Cloth  6 each Topical Q1200  . HYDROmorphone   Intravenous Q4H  .  insulin aspart  0-15 Units Subcutaneous Q6H  . ipratropium-albuterol  3 mL Nebulization BID  . lip balm  1 application Topical BID  . LORazepam  2 mg Oral Q6H  . mouth rinse  15 mL Mouth Rinse  q12n4p  . nicotine  21 mg Transdermal Daily  . oxyCODONE  10 mg Per Tube Q4H  . pantoprazole (PROTONIX) IV  40 mg Intravenous Q12H  . thiamine injection  100 mg Intravenous Daily   Continuous Infusions: . Marland KitchenTPN (CLINIMIX-E) Adult 115 mL/hr at 06/15/17 1300  . sodium chloride 10 mL/hr at 06/15/17 1300  . chlorproMAZINE (THORAZINE) IV Stopped (06/09/17 1649)  . famotidine (PEPCID) IV Stopped (06/15/17 0853)  . Marland KitchenTPN (CLINIMIX-E) Adult     And  . fat emulsion    . piperacillin-tazobactam (ZOSYN)  IV 3.375 g (06/15/17 1237)   PRN Meds: acetaminophen, butalbital-acetaminophen-caffeine, chlorproMAZINE (THORAZINE) IV, diphenhydrAMINE, HYDROmorphone, iopamidol, labetalol, LORazepam, magic mouthwash, menthol-cetylpyridinium, naloxone **AND** sodium chloride flush, ondansetron **OR** ondansetron (ZOFRAN) IV, phenol, sodium chloride flush  Time spent: 35 minutes  Author: Berle Mull, MD [facility] Pager: [phone] 06/15/2017 3:01 PM  If 7PM-7AM, please contact night-coverage at [email or web address], password [facility]

## 2017-06-15 NOTE — Progress Notes (Signed)
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 [name] for neck HTN COPD/Smokes  OPEN RIGHT ADRENALECTOMY WITH SMALL BOWEL RESECTION AND ANASTOMOSIS - 05/06/2017 - [name] - Additional surgery - 05/13/2017, 05/14/2017, 05/17/2017 and 05/20/2017 - Currently with distal ileum in discontinuity and catheter drainage of the 2 bowel ends. Now has new apparent enteric leak from a separate anastomosis communicating to the anterior abdomen and the VAC dressing.  - Last CT scan on 08/14 - Vac changes being switched to M, W, F            Malnutrition - On TPN (also for bowel rest) Anemia - Hgb - 8.8 - 06/15/2017  FEN: IVF, TPN, NGT VTE: SCD's, lovenox ID: Zosyn- 06/07/2017 >>> Foley: none Follow up: TBD  DISPO: will look at wound today and see if continued feculent drainage noted in wound. Due to increase in pain today may get repeat CT scan later today. It's been 6 days since last scan. Pt remains afebrile with a WBC of 11.     LOS: 40 days    Subjective: CC: abdominal pain  Wife and son at bedside. Pt was sleeping. Wife states pt had worsening RUQ abdominal pain today. Not much pain yesterday. No other new complaints. Pt stated pain under the area of his  wound vac.   Objective: Vital signs in last 24 hours: Temp:  [97.8 F (36.6 C)-98.7 F (37.1 C)] 97.8 F (36.6 C) (08/20 0808) Pulse Rate:  [88-97] 92 (08/20 0619) Resp:  [12-24] 20 (08/20 0943) BP: (104-138)/(70-84) 119/84 (08/20 0600) SpO2:  [94 %-100 %] 95 % (08/20 0943) Weight:  [180 lb 8.9 oz (81.9 kg)] 180 lb 8.9 oz (81.9 kg) (08/20 0403) Last BM Date: 06/07/17  Intake/Output from previous day: 08/19 0701 - 08/20 0700 In: 3697.9 [I.V.:3447.9; NG/GT:10; IV Piggyback:200] Out: 4496 [Urine:1700; Emesis/NG output:200; Drains:1130] Intake/Output this shift: Total I/O In: 490.4 [I.V.:490.4] Out: -   PE: Gen:  Alert, NAD, pleasant, cooperative, resting but did awake when asked to Card:  RRR, no M/G/R heard Pulm:  CTA anteriorly, no W/R/R, effort normal Abd: Soft, not distended, hypoactive BS, RUQ vac in place with dark brown material in canister. 2 foleys/drains in right abdomen one with scant yellow drainage and the other with dark green liquid drainage. Bulb drain with cloudy yellow drainage, TTP in area of vac, rest of abdomen nontender. Skin: no rashes noted, warm and dry   Anti-infectives: Anti-infectives    Start     Dose/Rate Route Frequency Ordered Stop   06/08/17 1200  piperacillin-tazobactam (ZOSYN) IVPB 3.375 g     3.375 g 12.5 mL/hr over 240 Minutes Intravenous Every 8 hours 06/08/17 1056     05/29/17 2200  ceFEPIme (MAXIPIME) 2 g in dextrose 5 % 50 mL IVPB  Status:  Discontinued     2 g 100 mL/hr over 30 Minutes Intravenous Every 8 hours 05/29/17 2031 06/03/17 0832   05/29/17 2100  metroNIDAZOLE (FLAGYL) IVPB 500 mg  Status:  Discontinued     500 mg 100 mL/hr over 60 Minutes Intravenous Every 8 hours 05/29/17 2029 06/03/17 0832   05/26/17 2200  ceFAZolin (ANCEF) IVPB 1 g/50 mL premix  Status:  Discontinued     1 g 100 mL/hr over 30 Minutes Intravenous Every 8 hours 05/26/17 1008 05/29/17 2023   05/25/17 1100  vancomycin (VANCOCIN) 1,250 mg in sodium chloride  0.9 % 250 mL IVPB  Status:  Discontinued     1,250 mg 166.7 mL/hr over 90 Minutes Intravenous Every 12 hours 05/25/17 0955 05/26/17 0946   05/22/17 1400  anidulafungin (ERAXIS) 100 mg in sodium chloride 0.9 % 100 mL IVPB     100 mg 78 mL/hr over 100 Minutes Intravenous Every 24 hours 05/21/17 1330 05/27/17 1740   05/21/17 1400  anidulafungin (ERAXIS) 200 mg in sodium chloride 0.9 % 200 mL IVPB     200 mg 78 mL/hr over 200 Minutes Intravenous  Once 05/21/17 1330 05/21/17 1940   05/15/17 1000  anidulafungin (ERAXIS) 100 mg in sodium chloride 0.9 % 100 mL IVPB  Status:  Discontinued     100 mg 78 mL/hr over 100 Minutes Intravenous Every 24 hours 05/14/17 0829 05/16/17 0901   05/15/17 1000  metroNIDAZOLE (FLAGYL) IVPB 500 mg  Status:  Discontinued     500 mg 100 mL/hr over 60 Minutes Intravenous Every 8 hours 05/15/17 0903 05/25/17 0938   05/14/17 0900  anidulafungin (ERAXIS) 200 mg in sodium chloride 0.9 % 200 mL IVPB     200 mg 78 mL/hr over 200 Minutes Intravenous  Once 05/14/17 0829 05/14/17 1325   05/13/17 1927  vancomycin (VANCOCIN) 1-5 GM/200ML-% IVPB    Comments:  Ward, Christa   : cabinet override      05/13/17 1927 05/14/17 0744   05/12/17 1400  ceFEPIme (MAXIPIME) 1 g in dextrose 5 % 50 mL IVPB     1 g 100 mL/hr over 30 Minutes Intravenous Every 8 hours 05/12/17 0939 05/26/17 2359   05/12/17 0900  vancomycin (VANCOCIN) IVPB 1000 mg/200 mL premix  Status:  Discontinued     1,000 mg 200 mL/hr over 60 Minutes Intravenous Every 12 hours 05/12/17 0750 05/16/17 0852   05/12/17 0830  aztreonam (AZACTAM) 2 GM IVPB     2 g 100 mL/hr over 30 Minutes Intravenous  Once 05/12/17 0800 05/12/17 0957   05/06/17 0916  vancomycin (VANCOCIN) IVPB 1000 mg/200 mL premix     1,000 mg 200 mL/hr over 60 Minutes Intravenous On call to O.R. 05/06/17 0916 05/06/17 1229      Lab Results:   Recent Labs  06/14/17 0400 06/15/17 0809  WBC 10.9* 11.0*  HGB 8.6* 8.8*  HCT 26.7* 26.8*  PLT 433* 452*    BMET  Recent Labs  06/14/17 0400 06/15/17 0809  NA 137 135  K 3.8 4.2  CL 106 103  CO2 25 25  GLUCOSE 108* 94  BUN 23* 21*  CREATININE 0.63 0.70  CALCIUM 8.8* 8.9   PT/INR No results for input(s): LABPROT, INR in the last 72 hours. CMP     Component Value Date/Time   NA 135 06/15/2017 0809   K 4.2 06/15/2017 0809   CL 103 06/15/2017 0809  CO2 25 06/15/2017 0809   GLUCOSE 94 06/15/2017 0809   BUN 21 (H) 06/15/2017 0809   CREATININE 0.70 06/15/2017 0809   CREATININE 0.85 07/22/2013 1021   CALCIUM 8.9 06/15/2017 0809   PROT 6.9 06/15/2017 0809   ALBUMIN 2.2 (L) 06/15/2017 0809   AST 20 06/15/2017 0809   ALT 19 06/15/2017 0809   ALKPHOS 139 (H) 06/15/2017 0809   BILITOT 1.2 06/15/2017 0809   GFRNONAA >60 06/15/2017 0809   GFRAA >60 06/15/2017 0809   Lipase     Component Value Date/Time   LIPASE 27 09/15/2015 0934    Studies/Results: Dg Chest Port 1 View  Result Date: 06/15/2017 CLINICAL DATA:  Shortness of breath. EXAM: PORTABLE CHEST 1 VIEW COMPARISON:  05/30/2017. FINDINGS: NG tube noted below the lower border of the image. Hemidiaphragms are incompletely imaged. Right PICC line tip in stable position at the cavoatrial junction. Heart size normal. Right base subsegmental atelectasis. Mild elevation right hemidiaphragm again noted . No pleural effusion or pneumothorax noted. Cervical spine fusion IMPRESSION: 1. NG tube noted with its tip below the lower border of the image. Hemidiaphragms are incompletely imaged. Right PICC line tip in stable position at the cavoatrial junction. 2. Mild right base subsegmental atelectasis . Mild elevation the right hemidiaphragm again noted . Electronically Signed   By: Marcello Moores  Register   On: 06/15/2017 06:18    Kalman Drape , [facility] 06/15/2017, 12:24 PM Pager: [phone] Consults: [phone] Mon-Fri 7:00 am-4:30 pm Sat-Sun 7:00 am-11:30 am  Seen by Dr. Hassell Done today - who wrote note and talked  to patient and wife.  Alphonsa Overall, MD, [facility] Pager: [phone] Office phone:  [phone]

## 2017-06-15 NOTE — Progress Notes (Signed)
Nutrition Follow-up  DOCUMENTATION CODES:   Not applicable  INTERVENTION:  - Continue TPN per Pharmacy. - RD will continue to monitor nutrition-related plan and provide interventions as warranted.   NUTRITION DIAGNOSIS:   Inadequate oral intake related to inability to eat as evidenced by NPO status. -ongoing  GOAL:   Patient will meet greater than or equal to 90% of their needs -met with TPN regimen  MONITOR:   Weight trends, Labs, Skin, I & O's, Other (Comment) (TPN regimen)  ASSESSMENT:   56 y.o. 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 On 7/18, he developed a leak therefore was taken back to OR for re-do of ex-lap and repair of leak.  Significant events: 7/11 KG:MWNUU adrenalectomy with complication of intestinal injury and small bowel resection with anastomosis  7/18 VO:ZDGUYQ of small bowel anastomotic leak, abdominal closure of wound dehisence 7/19 OR: reopening of recent laparotomy, lysis of adhesions, noted ischemic perforation of new loop of intestine, intact repair of previous anastomotic leak repair, intact previous anastomosis.  7/22 OR: wash out, tube ileostomy, left with open abdomen &wound vac in place. Plan for OR on 7/25 for closure 7/25 OR: mesh placed for abdominal closure, wound vac 8/2extubated, propofol drip off 8/4TPN off for ~ 5 hours due to line detached from filter. Ultrasound-guided aspiration By interventional radiology of small perihepatic fluid collection yielded only 1 mL of the fluid, sent for culture. NPO - mild coffee ground on NG but no active bleed 8/6patient improving. Off Precedex drip since 8/4. Dilaudid Drip being changed over to PCA. Still with some coffee ground-looking material in NG tube.  8/10 hyponatremia and plan to decrease TPN from  3L/day to 2L/day x1 week 8/21fund to have fecal matter in wound vac 8/14increase in fecal matter presence in wound vac with no plan for surgery at this time 8/16 TPN advanced to goal rate of 115 ml/hr with 20% ILE @ 20 mL/hr x12 hours on MWF   Pt continues with NGT to suction with 150cc during day shift yesterday and 550cc during night shift 8/18. Pt with triple lumen PICC and is receiving Clinimix E 5/15 @ 115 mL/hr with 20% ILE @ 20 mL/hr x12 hours on MWF. This regimen is providing a daily average of 138 grams of protein and 2165 kcal to meet 100% estimated protein and kcal need.   Per Dr. NPollie Friarnote yesterday at [phone] Currently with distal ileum in discontinuity and catheter drainage of the 2 bowel ends. Now has new apparent enteric leak.  Per Surgery PA note today at 1224: wound vac changes to be switched to MWF. Last CT was on 06/09/17. Will look at wound today and see if continued feculent drainage noted in wound. Due to increase in pain today may get repeat CT scan later today.  Medications reviewed; 20 mg IV Pepcid BID, sliding scale Novolog, 40 mg IV Protonix BID, 100 mg IV thiamine/day.  Labs reviewed; CBG: 115 mg/dL today, BUN: 21 mg/dL, Phos: 5 mg/dL, Alk Phos elevated today, triglycerides on 06/09/17: 323 mg/dL.     Diet Order:  Diet NPO time specified Except for: Ice Chips .TPN (CLINIMIX-E) Adult TPN (CLINIMIX-E) Adult  Skin:  Wound (see comment) (Incisions to R flank and abdomen from 7/11 and 7/18), 7/19, 7/22, 2/25  Last BM:  8/12 via ileostomy  Height:   Ht  Readings from Last 1 Encounters:  05/26/17 '5\' 10"'  (1.778 m)    Weight:   Wt Readings from Last 1 Encounters:  06/15/17 180 lb 8.9 oz (81.9 kg)    Ideal Body Weight:  75.45 kg  BMI:  Body mass index is 25.91 kg/m.  Estimated Nutritional Needs:   Kcal:  2030-2270 (25-28 kcal/kg)  Protein:  122-138 grams (1.5-1.7 grams/kg)  Fluid:  2 L/day  EDUCATION NEEDS:   No education needs identified at  this time     [name] Matin, MS, RD, LDN, CNSC Inpatient Clinical Dietitian Pager # [phone] After hours/weekend pager # [phone]

## 2017-06-16 LAB — MAGNESIUM: Magnesium: 1.7 mg/dL (ref 1.7–2.4)

## 2017-06-16 LAB — BASIC METABOLIC PANEL
ANION GAP: 8 (ref 5–15)
BUN: 21 mg/dL — ABNORMAL HIGH (ref 6–20)
CALCIUM: 9.2 mg/dL (ref 8.9–10.3)
CHLORIDE: 103 mmol/L (ref 101–111)
CO2: 24 mmol/L (ref 22–32)
Creatinine, Ser: 0.74 mg/dL (ref 0.61–1.24)
GFR calc non Af Amer: >60 mL/min (ref >60)
Glucose, Bld: 91 mg/dL (ref 65–99)
POTASSIUM: 4.5 mmol/L (ref 3.5–5.1)
Sodium: 135 mmol/L (ref 135–145)

## 2017-06-16 LAB — GLUCOSE, CAPILLARY
GLUCOSE-CAPILLARY: 116 mg/dL — AB (ref 65–99)
GLUCOSE-CAPILLARY: 115 mg/dL — AB (ref 65–99)
Glucose-Capillary: 111 mg/dL — ABNORMAL HIGH (ref 65–99)
Glucose-Capillary: 117 mg/dL — ABNORMAL HIGH (ref 65–99)

## 2017-06-16 LAB — CBC
HEMATOCRIT: 28 % — AB (ref 39.0–52.0)
HEMOGLOBIN: 9 g/dL — AB (ref 13.0–17.0)
MCH: 25.6 pg — ABNORMAL LOW (ref 26.0–34.0)
MCHC: 32.1 g/dL (ref 30.0–36.0)
MCV: 79.5 fL (ref 78.0–100.0)
Platelets: 493 10*3/uL — ABNORMAL HIGH (ref 150–400)
RBC: 3.52 MIL/uL — AB (ref 4.22–5.81)
RDW: 16.8 % — ABNORMAL HIGH (ref 11.5–15.5)
WBC: 11.3 10*3/uL — AB (ref 4.0–10.5)

## 2017-06-16 LAB — PHOSPHORUS: PHOSPHORUS: 5.6 mg/dL — AB (ref 2.5–4.6)

## 2017-06-16 MED ORDER — HYDROCOD POLST-CPM POLST ER 10-8 MG/5ML PO SUER
5.0000 mL | Freq: Two times a day (BID) | ORAL | Status: DC
  Administered 2017-06-16 – 2017-06-23 (×13): 5 mL via ORAL
  Filled 2017-06-16 (×14): qty 5

## 2017-06-16 MED ORDER — PIPERACILLIN-TAZOBACTAM 3.375 G IVPB
3.3750 g | Freq: Three times a day (TID) | INTRAVENOUS | Status: AC
  Administered 2017-06-16 (×2): 3.375 g via INTRAVENOUS
  Filled 2017-06-16 (×2): qty 50

## 2017-06-16 MED ORDER — MAGNESIUM SULFATE IN D5W 1-5 GM/100ML-% IV SOLN
1.0000 g | Freq: Once | INTRAVENOUS | Status: AC
  Administered 2017-06-16: 1 g via INTRAVENOUS
  Filled 2017-06-16: qty 100

## 2017-06-16 MED ORDER — TRACE MINERALS CR-CU-MN-SE-ZN 10-1000-500-60 MCG/ML IV SOLN
INTRAVENOUS | Status: AC
  Administered 2017-06-16: 19:00:00 via INTRAVENOUS
  Filled 2017-06-16: qty 2760

## 2017-06-16 NOTE — Progress Notes (Signed)
PHARMACY - ADULT TOTAL PARENTERAL NUTRITION CONSULT NOTE   Pharmacy Consult for TPN Indication: prolonged ileus, small bowel leak, multiple bowel surgeries  Patient Measurements: Body mass index is 25.91 kg/m. Filed Weights   06/13/17 0606 06/14/17 0500 06/15/17 0403  Weight: 180 lb 12.4 oz (82 kg) 179 lb 7.3 oz (81.4 kg) 180 lb 8.9 oz (81.9 kg)   HPI: 8 yoM admitted on 7/11 for enlarging adrenal mass and planned adrenalectomy.  Pharmacy consulted to dose TPN.  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   7/22 OR:  wash out, tube ileostomy, left with open abdomen & wound vac in place.  Plan for OR on 7/25 for closure 7/25 OR: mesh placed for abdominal closure, wound vac 8/2 extubated, propofol drip off 8/4 TPN off for ~ 5 hours due to line detached from filter 8/10 MD request decrease rate x 1 week 2nd low sodium 8/12 Additional sodium added to TPN due to persistent hyponatremia 8/16 Increasing rate back to goal since sodium has improved with above intervention.   8/20 Prealbumin decreased (possibly related to TPN rate below goal from 8/10 - 8/16?).  New apparent enteric leak per surgery notes. 8/21 CaPhos product elevated, remove electrolytes from TPN, but add Na to prevent hyponatremia.  Insulin requirements past 24 hours: 2 units SSI (no hx DM)  Current Nutrition: NPO, ice chips, popsicles  IVF: NS at 10 ml/hr  Central access: PICC line ordered 7/19, placed on 7/20 TPN start date: 7/20  ASSESSMENT                                                                                                           Today,  06/16/17  - Glucose: CBGs at goal (goal < 150) - Electrolytes:  Na 135 at low end of normal with standard electrolyte formulation TPN.  Monitor closely for further decreases.      Phos elevated and increased to 5.6  CorrCa 10.64, CaxPhos = 59.5 and trending up.  Goal < 55   K 4.2 and Mag 1.7, WNL - Renal:  SCr wnl, stable. recorded I/O net +1L,   NG +120 - Weight: 81.9 kg,  89.4kg on hospital admission - LFTs: WNL (8/20) - TGs:  296 (7/18), 266 (7/20), 269 (7/23), 210 (7/28), 285 (7/31) 301 (8/3), increased 323 (8/14), improved to 238 (8/20) - Prealbumin:  <5 (7/20), 6.1 (7/24), 10.1 (7/30) 24.8 (8/6), 26.4 (8/13), decreased to 15.9 (8/20)  NUTRITIONAL GOALS  RD recs (8/20):  122-138 gms Protein (1.5 - 1.7 g/kg) 2030 - 2270 Kcal  Recalculation: Clinimix 5/15 at rate of 115 ml/hr with lipids 3x/week on MWF only would provide: 138g protein and avg 2164 kcal/day  - Glucose infusion rate will be 3.53 mg/kg/min (Maximum 5 mg/kg/min)   PLAN                                                                                                                         Now: Magnesium 1g IV x1 dose  At 1800 today: ? Continue Clinimix 5/15 at 115 ml/hr.  ? REMOVE ELECTROLYTES due to CaPhos product > 55 ? Add sodium to match equivalent of Clinimix E formulation (35 mEq of Na/L in a 1:2 YE:[id number] ratio) to prevent recurrent hyponatremia. ? Lipids at 10mls/hr x 12 hours 3 times weekly on MWF  TPN to contain standard multivitamins and trace elements daily  Maintenance IVF NS at 10 ml/hr  CBGs and moderate SSI q6h.  TPN lab panels on Mondays & Thursdays.  Check BMET, Mag & Phos daily   Gretta Arab PharmD, BCPS Pager [phone] 06/16/2017 9:12 AM

## 2017-06-16 NOTE — Progress Notes (Signed)
[facility] Consultation Progress Note  Patient: [name] [name] MLY:[id number]   PCP: Lujean Amel, MD DOB: 08-Sep-1961   DOA: 05/06/2017   DOS: 06/16/2017   Date of Service: the patient was seen and examined on 06/16/2017 Primary service: [name], Arta Bruce, MD   Subjective: complains about more abdominal pain. Cough is actually making the pain better as well as make the headache worse. No vomiting or nausea.  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 Patient's course was complicated by small bowel injury requiring resection with anastomoses and placement of wound VAC. On 7/18, patient developed leak and was taken back to the operating room for redo of his exploratory laparotomy and repair of leak. That time, patient returned to the ICU on the ventilator. Critical care was consulted for ventilator/medical management.  07/19 - NGT pulled out and almost self-extubated. Developed cuff leak >> tube exchanged. Back to OR emergently for intestinal leak/ischemic bowel.TPN started at that time 07/22 - Back to OR for wash out >> left with open abdomen & wound vac in place. Transfused 1u PRBC. 07/25 - rising temp, WBC increased. Returned to OR for closure 07/27 - Recultured, sedation down on precedex [facility] 7/27 >> MRSE 1/2 > contaminant ? 07/30 - Remains on sedation, VAC leak over the weekend with dressing change by [facility] 07/31 - Initial attempt at weaning failed to due to tachypnea/anxiety 08/02 - Fever 101.1, WBC 14.2, weaned / extubated 08/03 - O2 weaned to 1L. Remains on dilaudid, initially on gtt now PCA. RN reports precedex started overnight for anxiety. 08/04 - Ultrasound-guided aspiration  By interventional radiology of small perihepatic fluid collection yielded only 1 mL of the fluid, sent for culture. No growth yet 08/06 - patient improving. Off Precedex drip since 8/4. Still on TPN 08/08 - wound vac changed per surgery. WBC 16. Abx d/c per surgery  08/13 - Patient with increased tachycardia and developed a fever.  Wound VAC noted with feculent material.  Started on IV Zosyn 08/14 - CT abdomen pelvis shows extraluminal stool in the right lower quadrant, surgery expects conservative management.  Currently further plan is continue IV antibiotics transition to oral when stable.  Assessment and Plan: 1. Sepsis- resolved now due to peritonitis and MSSA HCAP.   white count has been overall stable in the last couple of weeks, and antibiotics were discontinued by general surgery on 8/8.   Zosyn had to be restarted however on 8/13 due to concern for recurrent sepsis given fever and elevated heart rate. Heart rate as well as leukocytosis getting better. Patient primarily have leaking bowel, will defer to surgery for further management for definitive treatment, currently plan is conservative management with frequent wound VAC dressing changes.  At present we'll continue IV antibiotics for a total of 7 day from clinical improvement, last day for antibiotic 06/16/2017.  2. Peritonitis/intestinal perforation/anastomotic leak - 05/29/2017 CT abdomen and pelvis thick walled fluid collection along anterior and lateral aspects of the liver; extends 20 cm along anterior and lateral margins of liver.   Interventional radiology was consulted and on 05/30/2017 he underwent aspiration.  Cultures have remained negative. 05/20/17--ABDOMINAL WOUND [place] OUT, WOUND CLOSURE AND WOUND VAC PLACEMENT.  NG remains in place, patient  will need to be on TPN for quite some time per general surgery Noted from surgery note"may need repeat ct abdomen today." Will watch the results.   3. Acute respiratory failure with  hypoxia -resolved Secondary to sepsis and HCAP post op adrenalectomy.  Currently he is stable on nasal cannula.  He was intubated and has been extubated since 05/28/2017.  Continue DuoNeb's. Repeat chest x-ray shows no pneumonia, pleural effusion or pneumothorax.   4. Sinus tachycardia - ongoing, likely related to peritonitis - 2D echo obtained 8/13 showed an EF of 55-60% and grade 1 diastolic dysfunction  5. Acute postoperative blood loss anemia. Hemoglobin slowly trending downwards over last few days, initial drop was due to postoperative blood loss anemia. Current downward trend is most likely dilutional in the setting of receiving TPN as the patient is a 11 L positive. Patient is getting one PRBC due to being symptomatic. Will monitor.  There is some concerns for coffee-ground return in his NG tube therefore he is on SCDs and his Lovenox was on hold. Resume now that his Hb is stable.  On Protonix injectable twice a day as well as Pepcid added.  6. Hyponatremia, hypomagnesemia, hypokalemia, hyperphosphatemia -Suspect due to TPN, -TSH normal - Daily monitoring and correction  7 MSSA HCAP  finished 7 days of cefepime, afebrile, stable respiratory status Continue flutter device as well as incentive spirometer.  8. R-adrenal mass/pheochromocytoma  -s/p R-adrenalectomy, surgery was complicated by small bowel injury in the setting of adhesions with sepsis secondary to intra-abdominal infection Blood pressure remained stable after the surgery.  9. Acute metabolic encephalopathy -multifactorial including sepsis, infection, electrolyte derangement, opioids/benzos.  Stable  10 Thrombocytosis -likely acute phase reactant and possible iron deficiency -pt had normal platelet count at time of admission  11. Essential Hypertension -remains stable off home clonidine, labetalol, chlorthalidone, valsartan  12. Chronic pain syndrome/Post-op pain -had significant post-op pain -remains  on dilaudid PCA, po oxycodone scheduled, ativan scheduled -pain management per [facility] -previously on home gaba, percocet  13. Hyperlipidemia -remain off lipitor until able to tolerate po reliably  14 Tobacco abuse -nicoderm patch  15 Hyperglycemia -likely stress induced and TPN -A1C was 5.6, not a diabetic -continue novolog sliding scale  16. Cough. We will provide scheduled Tussionex  Diet:advance to clear liquid diet today per surgery. DVT Prophylaxis: SCD  Family Communication: no family was present at bedside, at the time of interview. Discussed with patient's wife on 06/15/2017  Disposition: We will continue to follow the patient.  Can resume DVT PX if Primary team wants.   Recommendation on discharge: To be determined.   Other Consultants: Wound care, critical care Procedures: Echocardiogram, rest per progress note Antibiotics: Anti-infectives    Start     Dose/Rate Route Frequency Ordered Stop   06/16/17 1200  piperacillin-tazobactam (ZOSYN) IVPB 3.375 g     3.375 g 12.5 mL/hr over 240 Minutes Intravenous Every 8 hours 06/16/17 1026 06/17/17 0359   06/08/17 1200  piperacillin-tazobactam (ZOSYN) IVPB 3.375 g  Status:  Discontinued     3.375 g 12.5 mL/hr over 240 Minutes Intravenous Every 8 hours 06/08/17 1056 06/16/17 1026   05/29/17 2200  ceFEPIme (MAXIPIME) 2 g in dextrose 5 % 50 mL IVPB  Status:  Discontinued     2 g 100 mL/hr over 30 Minutes Intravenous Every 8 hours 05/29/17 2031 06/03/17 0832   05/29/17 2100  metroNIDAZOLE (FLAGYL) IVPB 500 mg  Status:  Discontinued     500 mg 100 mL/hr over  60 Minutes Intravenous Every 8 hours 05/29/17 2029 06/03/17 0832   05/26/17 2200  ceFAZolin (ANCEF) IVPB 1 g/50 mL premix  Status:  Discontinued     1 g 100 mL/hr over 30 Minutes Intravenous Every 8 hours 05/26/17 1008 05/29/17 2023   05/25/17 1100  vancomycin (VANCOCIN) 1,250 mg in sodium chloride 0.9 % 250 mL IVPB  Status:  Discontinued     1,250 mg 166.7 mL/hr  over 90 Minutes Intravenous Every 12 hours 05/25/17 0955 05/26/17 0946   05/22/17 1400  anidulafungin (ERAXIS) 100 mg in sodium chloride 0.9 % 100 mL IVPB     100 mg 78 mL/hr over 100 Minutes Intravenous Every 24 hours 05/21/17 1330 05/27/17 1740   05/21/17 1400  anidulafungin (ERAXIS) 200 mg in sodium chloride 0.9 % 200 mL IVPB     200 mg 78 mL/hr over 200 Minutes Intravenous  Once 05/21/17 1330 05/21/17 1940   05/15/17 1000  anidulafungin (ERAXIS) 100 mg in sodium chloride 0.9 % 100 mL IVPB  Status:  Discontinued     100 mg 78 mL/hr over 100 Minutes Intravenous Every 24 hours 05/14/17 0829 05/16/17 0901   05/15/17 1000  metroNIDAZOLE (FLAGYL) IVPB 500 mg  Status:  Discontinued     500 mg 100 mL/hr over 60 Minutes Intravenous Every 8 hours 05/15/17 0903 05/25/17 0938   05/14/17 0900  anidulafungin (ERAXIS) 200 mg in sodium chloride 0.9 % 200 mL IVPB     200 mg 78 mL/hr over 200 Minutes Intravenous  Once 05/14/17 0829 05/14/17 1325   05/13/17 1927  vancomycin (VANCOCIN) 1-5 GM/200ML-% IVPB    Comments:  Ward, Christa   : cabinet override      05/13/17 1927 05/14/17 0744   05/12/17 1400  ceFEPIme (MAXIPIME) 1 g in dextrose 5 % 50 mL IVPB     1 g 100 mL/hr over 30 Minutes Intravenous Every 8 hours 05/12/17 0939 05/26/17 2359   05/12/17 0900  vancomycin (VANCOCIN) IVPB 1000 mg/200 mL premix  Status:  Discontinued     1,000 mg 200 mL/hr over 60 Minutes Intravenous Every 12 hours 05/12/17 0750 05/16/17 0852   05/12/17 0830  aztreonam (AZACTAM) 2 GM IVPB     2 g 100 mL/hr over 30 Minutes Intravenous  Once 05/12/17 0800 05/12/17 0957   05/06/17 0916  vancomycin (VANCOCIN) IVPB 1000 mg/200 mL premix     1,000 mg 200 mL/hr over 60 Minutes Intravenous On call to O.R. 05/06/17 0916 05/06/17 1229      Objective: Physical Exam: Vitals:   06/16/17 1400 06/16/17 1600 06/16/17 1648 06/16/17 1800  BP: 123/85 (!) 127/96  104/85  Pulse: (!) 25 98  97  Resp:   12 (!) 23  Temp:  98.2 F (36.8 C)     TempSrc:  Oral    SpO2: 96% 97% 96% 98%  Weight:      Height:        Intake/Output Summary (Last 24 hours) at 06/16/17 1902 Last data filed at 06/16/17 1845  Gross per 24 hour  Intake          2912.66 ml  Output             2925 ml  Net           -12.34 ml   Filed Weights   06/13/17 0606 06/14/17 0500 06/15/17 0403  Weight: 82 kg (180 lb 12.4 oz) 81.4 kg (179 lb 7.3 oz) 81.9 kg (180 lb 8.9 oz)   General:  Alert, Awake and Oriented to Time, Place and Person. Appear in mild distress, affect flat Eyes: PERRL, Conjunctiva normal ENT: Oral Mucosa clear moist. Cardiovascular: S1 and S2 Present, no Murmur, Respiratory: normal respiratory effort, Bilateral Air entry equal and Decreased, no use of accessory muscle, faint basal Crackles, no wheezes Abdomen: Bowel Sound present, Soft and mild diffuse tenderness,open abdominal wound with wound VAC. Extremities: trace Pedal edema, no calf tenderness Neurologic: Grossly no focal neuro deficit. Bilaterally Equal motor strength   Data Reviewed: CBC:  Recent Labs Lab 06/11/17 0804  06/12/17 0607 06/13/17 0430 06/14/17 0400 06/15/17 0809 06/16/17 0428  WBC 12.2*  --  11.1* 10.2 10.9* 11.0* 11.3*  NEUTROABS 7.1  --   --   --   --  5.1  --   HGB 7.7*  < > 8.8* 8.2* 8.6* 8.8* 9.0*  HCT 24.2*  < > 27.4* 25.5* 26.7* 26.8* 28.0*  MCV 80.9  --  79.9 85.3 80.4 78.6 79.5  PLT 503*  --  453* 430* 433* 452* 493*  < > = values in this interval not displayed. Basic Metabolic Panel:  Recent Labs Lab 06/12/17 0607 06/13/17 0430 06/14/17 0400 06/15/17 0809 06/16/17 0428  NA 142 140 137 135 135  K 3.6 3.3* 3.8 4.2 4.5  CL 106 106 106 103 103  CO2 30 26 25 25 24   GLUCOSE 98 126* 108* 94 91  BUN 23* 22* 23* 21* 21*  CREATININE 0.76 0.69 0.63 0.70 0.74  CALCIUM 9.1 8.8* 8.8* 8.9 9.2  MG 1.4* 1.6* 1.6* 1.8 1.7  PHOS 4.2 5.0* 4.8* 5.0* 5.6*   Liver Function Tests:  Recent Labs Lab 06/10/17 0603 06/11/17 0526 06/12/17 0607  06/14/17 0400 06/15/17 0809  AST 23 27 23 21 20   ALT 21 21 18 18 19   ALKPHOS 174* 165* 145* 123 139*  BILITOT 0.6 0.6 0.8 1.1 1.2  PROT 7.1 7.0 6.8 6.6 6.9  ALBUMIN 2.5* 2.4* 2.4* 2.3* 2.2*   No results for input(s): LIPASE, AMYLASE in the last 168 hours. No results for input(s): AMMONIA in the last 168 hours. Cardiac Enzymes: No results for input(s): CKTOTAL, CKMB, CKMBINDEX, TROPONINI in the last 168 hours. BNP (last 3 results) No results for input(s): BNP in the last 8760 hours. CBG:  Recent Labs Lab 06/15/17 1804 06/15/17 2319 06/16/17 0542 06/16/17 1217 06/16/17 1747  GLUCAP 114* 110* 116* 111* 115*   Recent Results (from the past 240 hour(s))  MRSA PCR Screening     Status: None   Collection Time: 06/11/17  5:59 PM  Result Value Ref Range Status   MRSA by PCR NEGATIVE NEGATIVE Final    Comment:        The GeneXpert MRSA Assay (FDA approved for NASAL specimens only), is one component of a comprehensive MRSA colonization surveillance program. It is not intended to diagnose MRSA infection nor to guide or monitor treatment for MRSA infections.     Studies: No results found.   Scheduled Meds: . chlorhexidine  15 mL Mouth Rinse BID  . Chlorhexidine Gluconate Cloth  6 each Topical Q1200  . chlorpheniramine-HYDROcodone  5 mL Oral Q12H  . HYDROmorphone   Intravenous Q4H  . insulin aspart  0-15 Units Subcutaneous Q6H  . ipratropium-albuterol  3 mL Nebulization BID  . lip balm  1 application Topical BID  . LORazepam  2 mg Oral Q6H  . mouth rinse  15 mL Mouth Rinse q12n4p  . nicotine  21 mg Transdermal Daily  . oxyCODONE  10 mg Per Tube Q4H  . pantoprazole (PROTONIX) IV  40 mg Intravenous Q12H  . thiamine injection  100 mg Intravenous Daily   Continuous Infusions: . sodium chloride 10 mL/hr at 06/16/17 0600  . chlorproMAZINE (THORAZINE) IV Stopped (06/09/17 1649)  . famotidine (PEPCID) IV Stopped (06/16/17 3074)  . piperacillin-tazobactam (ZOSYN)  IV  Stopped (06/16/17 1700)  . TPN (CLINIMIX) Adult without lytes 115 mL/hr at 06/16/17 1841   PRN Meds: acetaminophen, butalbital-acetaminophen-caffeine, chlorproMAZINE (THORAZINE) IV, diphenhydrAMINE, HYDROmorphone, iopamidol, labetalol, LORazepam, magic mouthwash, menthol-cetylpyridinium, naloxone **AND** sodium chloride flush, ondansetron **OR** ondansetron (ZOFRAN) IV, phenol, sodium chloride flush  Time spent: 35 minutes  Author: Berle Mull, MD [facility] Pager: [phone] 06/16/2017 7:02 PM  If 7PM-7AM, please contact night-coverage at [email or web address], password [facility]

## 2017-06-16 NOTE — Progress Notes (Signed)
Patient ID: [name] [name], male   DOB: 03-06-1961, 56 y.o.   MRN: [id number] [facility] Surgery Progress Note:   27 Days Post-Op  Subjective: Mental status is clear.  Not talkative because NG is producing sore throat. No change in mental status Objective: Vital signs in last 24 hours: Temp:  [97.5 F (36.4 C)-98.9 F (37.2 C)] 98.9 F (37.2 C) (08/21 0300) Pulse Rate:  [88-95] 94 (08/21 0600) Resp:  [17-22] 22 (08/21 0823) BP: (99-131)/(68-87) 126/79 (08/21 0600) SpO2:  [96 %-100 %] 96 % (08/21 0823) FiO2 (%):  [21 %] 21 % (08/20 2101)  Intake/Output from previous day: 08/20 0701 - 08/21 0700 In: 3532.8 [I.V.:3262.8; NG/GT:120; IV Piggyback:150] Out: 2530 [Urine:800; Emesis/NG output:1050; Drains:680] Intake/Output this shift: No intake/output data recorded.  Physical Exam: Work of breathing is not labored.  VAC in place and two foleys in place and draining  Lab Results:  Results for orders placed or performed during the hospital encounter of 05/06/17 (from the past 48 hour(s))  Glucose, capillary     Status: Abnormal   Collection Time: 06/14/17 11:13 AM  Result Value Ref Range   Glucose-Capillary 119 (H) 65 - 99 mg/dL  Glucose, capillary     Status: Abnormal   Collection Time: 06/14/17  5:35 PM  Result Value Ref Range   Glucose-Capillary 125 (H) 65 - 99 mg/dL  Glucose, capillary     Status: Abnormal   Collection Time: 06/14/17 11:58 PM  Result Value Ref Range   Glucose-Capillary 126 (H) 65 - 99 mg/dL   Comment 1 Notify RN    Comment 2 Document in Chart   Glucose, capillary     Status: Abnormal   Collection Time: 06/15/17  6:07 AM  Result Value Ref Range   Glucose-Capillary 120 (H) 65 - 99 mg/dL   Comment 1 Notify RN    Comment 2 Document in Chart   CBC with Differential/Platelet     Status: Abnormal   Collection Time: 06/15/17  8:09 AM  Result Value Ref Range   WBC 11.0 (H) 4.0 - 10.5 K/uL   RBC 3.41 (L) 4.22 - 5.81 MIL/uL   Hemoglobin 8.8 (L) 13.0 - 17.0  g/dL   HCT 26.8 (L) 39.0 - 52.0 %   MCV 78.6 78.0 - 100.0 fL   MCH 25.8 (L) 26.0 - 34.0 pg   MCHC 32.8 30.0 - 36.0 g/dL   RDW 16.4 (H) 11.5 - 15.5 %   Platelets 452 (H) 150 - 400 K/uL   Neutrophils Relative % 47 %   Neutro Abs 5.1 1.7 - 7.7 K/uL   Lymphocytes Relative 40 %   Lymphs Abs 4.4 (H) 0.7 - 4.0 K/uL   Monocytes Relative 7 %   Monocytes Absolute 0.8 0.1 - 1.0 K/uL   Eosinophils Relative 6 %   Eosinophils Absolute 0.7 0.0 - 0.7 K/uL   Basophils Relative 0 %   Basophils Absolute 0.0 0.0 - 0.1 K/uL  Comprehensive metabolic panel     Status: Abnormal   Collection Time: 06/15/17  8:09 AM  Result Value Ref Range   Sodium 135 135 - 145 mmol/L   Potassium 4.2 3.5 - 5.1 mmol/L   Chloride 103 101 - 111 mmol/L   CO2 25 22 - 32 mmol/L   Glucose, Bld 94 65 - 99 mg/dL   BUN 21 (H) 6 - 20 mg/dL   Creatinine, Ser 0.70 0.61 - 1.24 mg/dL   Calcium 8.9 8.9 - 10.3 mg/dL   Total Protein  6.9 6.5 - 8.1 g/dL   Albumin 2.2 (L) 3.5 - 5.0 g/dL   AST 20 15 - 41 U/L   ALT 19 17 - 63 U/L   Alkaline Phosphatase 139 (H) 38 - 126 U/L   Total Bilirubin 1.2 0.3 - 1.2 mg/dL   GFR calc non Af Amer >60 >60 mL/min   GFR calc Af Amer >60 >60 mL/min    Comment: (NOTE) The eGFR has been calculated using the CKD EPI equation. This calculation has not been validated in all clinical situations. eGFR's persistently <60 mL/min signify possible Chronic Kidney Disease.    Anion gap 7 5 - 15  Magnesium     Status: None   Collection Time: 06/15/17  8:09 AM  Result Value Ref Range   Magnesium 1.8 1.7 - 2.4 mg/dL  Phosphorus     Status: Abnormal   Collection Time: 06/15/17  8:09 AM  Result Value Ref Range   Phosphorus 5.0 (H) 2.5 - 4.6 mg/dL  Triglycerides     Status: Abnormal   Collection Time: 06/15/17  8:09 AM  Result Value Ref Range   Triglycerides 238 (H) <150 mg/dL    Comment: Performed at [facility] [address] [place], [place] [address]  Prealbumin     Status: Abnormal   Collection  Time: 06/15/17 11:20 AM  Result Value Ref Range   Prealbumin 15.9 (L) 18 - 38 mg/dL    Comment: Performed at [facility] [address] [place], [place] [address]  Glucose, capillary     Status: Abnormal   Collection Time: 06/15/17  1:01 PM  Result Value Ref Range   Glucose-Capillary 115 (H) 65 - 99 mg/dL   Comment 1 Notify RN    Comment 2 Document in Chart   Glucose, capillary     Status: Abnormal   Collection Time: 06/15/17  6:04 PM  Result Value Ref Range   Glucose-Capillary 114 (H) 65 - 99 mg/dL   Comment 1 Notify RN    Comment 2 Document in Chart   Glucose, capillary     Status: Abnormal   Collection Time: 06/15/17 11:19 PM  Result Value Ref Range   Glucose-Capillary 110 (H) 65 - 99 mg/dL   Comment 1 Notify RN    Comment 2 Document in Chart   CBC     Status: Abnormal   Collection Time: 06/16/17  4:28 AM  Result Value Ref Range   WBC 11.3 (H) 4.0 - 10.5 K/uL   RBC 3.52 (L) 4.22 - 5.81 MIL/uL   Hemoglobin 9.0 (L) 13.0 - 17.0 g/dL   HCT 28.0 (L) 39.0 - 52.0 %   MCV 79.5 78.0 - 100.0 fL   MCH 25.6 (L) 26.0 - 34.0 pg   MCHC 32.1 30.0 - 36.0 g/dL   RDW 16.8 (H) 11.5 - 15.5 %   Platelets 493 (H) 150 - 400 K/uL  Basic metabolic panel     Status: Abnormal   Collection Time: 06/16/17  4:28 AM  Result Value Ref Range   Sodium 135 135 - 145 mmol/L   Potassium 4.5 3.5 - 5.1 mmol/L   Chloride 103 101 - 111 mmol/L   CO2 24 22 - 32 mmol/L   Glucose, Bld 91 65 - 99 mg/dL   BUN 21 (H) 6 - 20 mg/dL   Creatinine, Ser 0.74 0.61 - 1.24 mg/dL   Calcium 9.2 8.9 - 10.3 mg/dL   GFR calc non Af Amer >60 >60 mL/min  GFR calc Af Amer >60 >60 mL/min    Comment: (NOTE) The eGFR has been calculated using the CKD EPI equation. This calculation has not been validated in all clinical situations. eGFR's persistently <60 mL/min signify possible Chronic Kidney Disease.    Anion gap 8 5 - 15  Magnesium     Status: None   Collection Time: 06/16/17  4:28 AM  Result Value Ref Range    Magnesium 1.7 1.7 - 2.4 mg/dL  Phosphorus     Status: Abnormal   Collection Time: 06/16/17  4:28 AM  Result Value Ref Range   Phosphorus 5.6 (H) 2.5 - 4.6 mg/dL  Glucose, capillary     Status: Abnormal   Collection Time: 06/16/17  5:42 AM  Result Value Ref Range   Glucose-Capillary 116 (H) 65 - 99 mg/dL    Radiology/Results: Dg Chest Port 1 View  Result Date: 06/15/2017 CLINICAL DATA:  Shortness of breath. EXAM: PORTABLE CHEST 1 VIEW COMPARISON:  05/30/2017. FINDINGS: NG tube noted below the lower border of the image. Hemidiaphragms are incompletely imaged. Right PICC line tip in stable position at the cavoatrial junction. Heart size normal. Right base subsegmental atelectasis. Mild elevation right hemidiaphragm again noted . No pleural effusion or pneumothorax noted. Cervical spine fusion IMPRESSION: 1. NG tube noted with its tip below the lower border of the image. Hemidiaphragms are incompletely imaged. Right PICC line tip in stable position at the cavoatrial junction. 2. Mild right base subsegmental atelectasis . Mild elevation the right hemidiaphragm again noted . Electronically Signed   By: Marcello Moores  Register   On: 06/15/2017 06:18    Anti-infectives: Anti-infectives    Start     Dose/Rate Route Frequency Ordered Stop   06/08/17 1200  piperacillin-tazobactam (ZOSYN) IVPB 3.375 g     3.375 g 12.5 mL/hr over 240 Minutes Intravenous Every 8 hours 06/08/17 1056     05/29/17 2200  ceFEPIme (MAXIPIME) 2 g in dextrose 5 % 50 mL IVPB  Status:  Discontinued     2 g 100 mL/hr over 30 Minutes Intravenous Every 8 hours 05/29/17 2031 06/03/17 0832   05/29/17 2100  metroNIDAZOLE (FLAGYL) IVPB 500 mg  Status:  Discontinued     500 mg 100 mL/hr over 60 Minutes Intravenous Every 8 hours 05/29/17 2029 06/03/17 0832   05/26/17 2200  ceFAZolin (ANCEF) IVPB 1 g/50 mL premix  Status:  Discontinued     1 g 100 mL/hr over 30 Minutes Intravenous Every 8 hours 05/26/17 1008 05/29/17 2023   05/25/17 1100   vancomycin (VANCOCIN) 1,250 mg in sodium chloride 0.9 % 250 mL IVPB  Status:  Discontinued     1,250 mg 166.7 mL/hr over 90 Minutes Intravenous Every 12 hours 05/25/17 0955 05/26/17 0946   05/22/17 1400  anidulafungin (ERAXIS) 100 mg in sodium chloride 0.9 % 100 mL IVPB     100 mg 78 mL/hr over 100 Minutes Intravenous Every 24 hours 05/21/17 1330 05/27/17 1740   05/21/17 1400  anidulafungin (ERAXIS) 200 mg in sodium chloride 0.9 % 200 mL IVPB     200 mg 78 mL/hr over 200 Minutes Intravenous  Once 05/21/17 1330 05/21/17 1940   05/15/17 1000  anidulafungin (ERAXIS) 100 mg in sodium chloride 0.9 % 100 mL IVPB  Status:  Discontinued     100 mg 78 mL/hr over 100 Minutes Intravenous Every 24 hours 05/14/17 0829 05/16/17 0901   05/15/17 1000  metroNIDAZOLE (FLAGYL) IVPB 500 mg  Status:  Discontinued     500  mg 100 mL/hr over 60 Minutes Intravenous Every 8 hours 05/15/17 0903 05/25/17 0938   05/14/17 0900  anidulafungin (ERAXIS) 200 mg in sodium chloride 0.9 % 200 mL IVPB     200 mg 78 mL/hr over 200 Minutes Intravenous  Once 05/14/17 0829 05/14/17 1325   05/13/17 1927  vancomycin (VANCOCIN) 1-5 GM/200ML-% IVPB    Comments:  Ward, Christa   : cabinet override      05/13/17 1927 05/14/17 0744   05/12/17 1400  ceFEPIme (MAXIPIME) 1 g in dextrose 5 % 50 mL IVPB     1 g 100 mL/hr over 30 Minutes Intravenous Every 8 hours 05/12/17 0939 05/26/17 2359   05/12/17 0900  vancomycin (VANCOCIN) IVPB 1000 mg/200 mL premix  Status:  Discontinued     1,000 mg 200 mL/hr over 60 Minutes Intravenous Every 12 hours 05/12/17 0750 05/16/17 0852   05/12/17 0830  aztreonam (AZACTAM) 2 GM IVPB     2 g 100 mL/hr over 30 Minutes Intravenous  Once 05/12/17 0800 05/12/17 0957   05/06/17 0916  vancomycin (VANCOCIN) IVPB 1000 mg/200 mL premix     1,000 mg 200 mL/hr over 60 Minutes Intravenous On call to O.R. 05/06/17 0916 05/06/17 1229      Assessment/Plan: Problem List: Patient Active Problem List   Diagnosis Date  Noted  . Peritonitis ([place]) 06/01/2017  . Pressure injury of skin 05/29/2017  . Mucus plugging of bronchi   . Acute respiratory failure with hypoxemia ([place])   . Ileus ([place])   . Hypokalemia 05/14/2017  . Anastomotic leak of intestine 05/14/2017  . Severe sepsis ([place]) 05/14/2017  . Wound dehiscence, surgical 05/14/2017  . Aspiration pneumonia ([place]) 05/14/2017  . Chronic narcotic use 05/10/2017  . Anxiety state 05/10/2017  . Intra-abdominal adhesions s/p SB resection 05/06/2017 05/09/2017  . H/O total adrenalectomy ([place]) 05/06/2017  . Right adrenal mass s/p adrenalectomy 05/06/2017 05/06/2017  . Throat symptom 03/18/2016  . Multinodular goiter 01/19/2016  . Hyperthyroidism 01/19/2016  . Essential hypertension   . Diarrhea 11/30/2014  . Anemia, iron deficiency 11/01/2014  . Leukocytosis 11/03/2013  . Tobacco abuse 07/26/2013  . COPD (chronic obstructive pulmonary disease) ([place]) 07/26/2013  . Left knee pain 07/26/2013  . Pain in joint, shoulder region 05/03/2013  . GERD 07/24/2008  . 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  Ice chips with NG.  May consider small amounts of elemental liquids that will be absorbed in proximal gut along with probiotics.   27 Days Post-Op    LOS: 41 days   Matt B. Hassell Done, MD, [facility] [phone] beeper [phone]  06/16/2017 9:56 AM

## 2017-06-17 LAB — GLUCOSE, CAPILLARY
Glucose-Capillary: 125 mg/dL — ABNORMAL HIGH (ref 65–99)
Glucose-Capillary: 125 mg/dL — ABNORMAL HIGH (ref 65–99)
Glucose-Capillary: 126 mg/dL — ABNORMAL HIGH (ref 65–99)
Glucose-Capillary: 132 mg/dL — ABNORMAL HIGH (ref 65–99)

## 2017-06-17 LAB — MAGNESIUM: Magnesium: 1.5 mg/dL — ABNORMAL LOW (ref 1.7–2.4)

## 2017-06-17 LAB — BASIC METABOLIC PANEL
Anion gap: 7 (ref 5–15)
BUN: 23 mg/dL — ABNORMAL HIGH (ref 6–20)
CHLORIDE: 100 mmol/L — AB (ref 101–111)
CO2: 23 mmol/L (ref 22–32)
CREATININE: 0.76 mg/dL (ref 0.61–1.24)
Calcium: 9.2 mg/dL (ref 8.9–10.3)
GFR calc non Af Amer: >60 mL/min (ref >60)
GLUCOSE: 125 mg/dL — AB (ref 65–99)
Potassium: 4.6 mmol/L (ref 3.5–5.1)
Sodium: 130 mmol/L — ABNORMAL LOW (ref 135–145)

## 2017-06-17 LAB — PHOSPHORUS: PHOSPHORUS: 4.7 mg/dL — AB (ref 2.5–4.6)

## 2017-06-17 MED ORDER — SACCHAROMYCES BOULARDII 250 MG PO CAPS
250.0000 mg | ORAL_CAPSULE | Freq: Two times a day (BID) | ORAL | Status: DC
  Administered 2017-06-17 – 2017-07-07 (×29): 250 mg via ORAL
  Filled 2017-06-17 (×36): qty 1

## 2017-06-17 MED ORDER — FAT EMULSION 20 % IV EMUL
240.0000 mL | INTRAVENOUS | Status: AC
  Administered 2017-06-17: 240 mL via INTRAVENOUS
  Filled 2017-06-17: qty 250

## 2017-06-17 MED ORDER — FUROSEMIDE 20 MG PO TABS
20.0000 mg | ORAL_TABLET | Freq: Every day | ORAL | Status: AC
  Administered 2017-06-17 – 2017-06-18 (×2): 20 mg via ORAL
  Filled 2017-06-17: qty 1

## 2017-06-17 MED ORDER — BOOST / RESOURCE BREEZE PO LIQD
1.0000 | Freq: Two times a day (BID) | ORAL | Status: DC
  Administered 2017-06-17: 237 mL via ORAL
  Administered 2017-06-18 – 2017-06-27 (×9): 1 via ORAL

## 2017-06-17 MED ORDER — MAGNESIUM SULFATE 4 GM/100ML IV SOLN
4.0000 g | Freq: Once | INTRAVENOUS | Status: AC
  Administered 2017-06-17: 4 g via INTRAVENOUS
  Filled 2017-06-17: qty 100

## 2017-06-17 MED ORDER — NYSTATIN 100000 UNIT/ML MT SUSP
5.0000 mL | Freq: Three times a day (TID) | OROMUCOSAL | Status: DC
  Administered 2017-06-17 – 2017-07-01 (×41): 500000 [IU] via ORAL
  Filled 2017-06-17 (×38): qty 5

## 2017-06-17 MED ORDER — TRACE MINERALS CR-CU-MN-SE-ZN 10-1000-500-60 MCG/ML IV SOLN
INTRAVENOUS | Status: AC
  Administered 2017-06-17: 17:00:00 via INTRAVENOUS
  Filled 2017-06-17: qty 2000
  Filled 2017-06-17: qty 2760

## 2017-06-17 NOTE — Progress Notes (Signed)
[facility] Consultation Progress Note  Patient: [name] [name] TIW:[id number]   PCP: Lujean Amel, MD DOB: 18-Sep-1961   DOA: 05/06/2017   DOS: 06/17/2017   Date of Service: the patient was seen and examined on 06/17/2017 Primary service: [name], Arta Bruce, MD   Subjective: complains about more abdominal pain. Cough is actually making the pain better as well as make the headache worse. No vomiting or nausea.  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 Patient's course was complicated by small bowel injury requiring resection with anastomoses and placement of wound VAC. On 7/18, patient developed leak and was taken back to the operating room for redo of his exploratory laparotomy and repair of leak. That time, patient returned to the ICU on the ventilator. Critical care was consulted for ventilator/medical management.  07/19 - NGT pulled out and almost self-extubated. Developed cuff leak >> tube exchanged. Back to OR emergently for intestinal leak/ischemic bowel.TPN started at that time 07/22 - Back to OR for wash out >> left with open abdomen & wound vac in place. Transfused 1u PRBC. 07/25 - rising temp, WBC increased. Returned to OR for closure 07/27 - Recultured, sedation down on precedex [facility] 7/27 >> MRSE 1/2 > contaminant ? 07/30 - Remains on sedation, VAC leak over the weekend with dressing change by [facility] 07/31 - Initial attempt at weaning failed to due to tachypnea/anxiety 08/02 - Fever 101.1, WBC 14.2, weaned / extubated 08/03 - O2 weaned to 1L. Remains on dilaudid, initially on gtt now PCA. RN reports precedex started overnight for anxiety. 08/04 - Ultrasound-guided aspiration  By interventional radiology of small perihepatic fluid collection yielded only 1 mL of the fluid, sent for culture. No growth yet 08/06 - patient improving. Off Precedex drip since 8/4. Still on TPN 08/08 - wound vac changed per surgery. WBC 16. Abx d/c per surgery  08/13 - Patient with increased tachycardia and developed a fever.  Wound VAC noted with feculent material.  Started on IV Zosyn 08/14 - CT abdomen pelvis shows extraluminal stool in the right lower quadrant, surgery expects conservative management.  Assessment and Plan: 1. Sepsis- resolved now -due to peritonitis/abd abscess and MSSA HCAP.   -white count has been overall stable,, and antibiotics were discontinued by general surgery on 8/8, then Zosyn had to be restarted however on 8/13 due to concern for recurrent sepsis from peritonitis given fever and elevated heart rate, completed on 8/21 -monitor now -always at risk of sepsis with ongoing leak and fluid overload from TNA  2. Peritonitis/intestinal perforation/anastomotic leak - 05/29/2017 CT abdomen and pelvis thick walled fluid collection along anterior and lateral aspects of the liver; extends 20 cm along anterior and lateral margins of liver.   Interventional radiology was consulted and on 05/30/2017 he underwent aspiration.   -Cultures have remained negative. 05/20/17--ABDOMINAL WOUND [place] OUT, WOUND CLOSURE AND WOUND VAC PLACEMENT.  NG remains in place, patient will need to be on TPN for quite some time per general surgery -per [facility] -last CT 8/14, getting Vac changes MWF  3. Acute respiratory failure with hypoxia  -resolved, secondary to sepsis and HCAP post op adrenalectomy.  -stable now - He was intubated and then extubated since 05/28/2017.  Continue  DuoNeb's. - Repeat chest x-ray shows no pneumonia, pleural effusion or pneumothorax.  -overall volume overloaded from all the fluid in in TNA will give him Lasix by mouth low-dose for 2 days  4. Sinus tachycardia -  ongoing, likely related to peritonitis - 2D echo obtained 8/13 showed an EF of 55-60% and grade 1 diastolic dysfunction  5. Acute postoperative blood loss anemia. -due to acute illness and dilution -There was some concerns for coffee-ground return in his NG tube therefore he is on SCDs and his Lovenox was on hold, continue PPI   6. Hyponatremia, hypomagnesemia, hypokalemia, hyperphosphatemia -Suspect due to TPN, -TSH normal - Daily monitoring and correction  7 MSSA HCAP  finished 7 days of cefepime, afebrile, stable respiratory status Continue flutter device as well as incentive spirometer, OOB  8. R-adrenal mass/pheochromocytoma  -s/p R-adrenalectomy, surgery was complicated by small bowel injury in the setting of adhesions with sepsis secondary to intra-abdominal infection  9. Acute metabolic encephalopathy -multifactorial including sepsis, infection, electrolyte derangement, opioids/benzos.   -Stable  10 Thrombocytosis -likely acute phase reactant   11. Essential Hypertension -remains stable off home clonidine, labetalol, chlorthalidone, valsartan  12. Chronic pain syndrome/Post-op pain -had significant post-op pain -remains on dilaudid PCA, po oxycodone scheduled, ativan scheduled -pain management per [facility] -previously on home gaba, percocet  13. Hyperlipidemia -remain off lipitor until able to tolerate po reliably  14 Tobacco abuse -nicoderm patch  15 Hyperglycemia -likely stress induced and TPN -A1C was 5.6, not a diabetic -continue novolog sliding scale  Diet:advance to clear liquid diet per [facility] DVT Prophylaxis: SCDs  Family Communication: wife at bedside Disposition: to be determined  Other Consultants: Wound care, critical care Procedures: Echocardiogram, rest per progress note  Antibiotics: Anti-infectives    Start     Dose/Rate Route Frequency Ordered Stop   06/16/17 1200  piperacillin-tazobactam (ZOSYN) IVPB 3.375 g     3.375 g 12.5  mL/hr over 240 Minutes Intravenous Every 8 hours 06/16/17 1026 06/17/17 0004   06/08/17 1200  piperacillin-tazobactam (ZOSYN) IVPB 3.375 g  Status:  Discontinued     3.375 g 12.5 mL/hr over 240 Minutes Intravenous Every 8 hours 06/08/17 1056 06/16/17 1026   05/29/17 2200  ceFEPIme (MAXIPIME) 2 g in dextrose 5 % 50 mL IVPB  Status:  Discontinued     2 g 100 mL/hr over 30 Minutes Intravenous Every 8 hours 05/29/17 2031 06/03/17 0832   05/29/17 2100  metroNIDAZOLE (FLAGYL) IVPB 500 mg  Status:  Discontinued     500 mg 100 mL/hr over 60 Minutes Intravenous Every 8 hours 05/29/17 2029 06/03/17 0832   05/26/17 2200  ceFAZolin (ANCEF) IVPB 1 g/50 mL premix  Status:  Discontinued     1 g 100 mL/hr over 30 Minutes Intravenous Every 8 hours 05/26/17 1008 05/29/17 2023   05/25/17 1100  vancomycin (VANCOCIN) 1,250 mg in sodium chloride 0.9 % 250 mL IVPB  Status:  Discontinued     1,250 mg 166.7 mL/hr over 90 Minutes Intravenous Every 12 hours 05/25/17 0955 05/26/17 0946   05/22/17 1400  anidulafungin (ERAXIS) 100 mg in sodium chloride 0.9 % 100 mL IVPB     100 mg 78 mL/hr over 100 Minutes Intravenous Every 24 hours 05/21/17 1330 05/27/17 1740   05/21/17 1400  anidulafungin (ERAXIS) 200 mg in sodium chloride 0.9 % 200 mL IVPB     200 mg 78 mL/hr over 200 Minutes Intravenous  Once 05/21/17 1330 05/21/17 1940   05/15/17 1000  anidulafungin (ERAXIS) 100  mg in sodium chloride 0.9 % 100 mL IVPB  Status:  Discontinued     100 mg 78 mL/hr over 100 Minutes Intravenous Every 24 hours 05/14/17 0829 05/16/17 0901   05/15/17 1000  metroNIDAZOLE (FLAGYL) IVPB 500 mg  Status:  Discontinued     500 mg 100 mL/hr over 60 Minutes Intravenous Every 8 hours 05/15/17 0903 05/25/17 0938   05/14/17 0900  anidulafungin (ERAXIS) 200 mg in sodium chloride 0.9 % 200 mL IVPB     200 mg 78 mL/hr over 200 Minutes Intravenous  Once 05/14/17 0829 05/14/17 1325   05/13/17 1927  vancomycin (VANCOCIN) 1-5 GM/200ML-% IVPB      Comments:  Ward, Christa   : cabinet override      05/13/17 1927 05/14/17 0744   05/12/17 1400  ceFEPIme (MAXIPIME) 1 g in dextrose 5 % 50 mL IVPB     1 g 100 mL/hr over 30 Minutes Intravenous Every 8 hours 05/12/17 0939 05/26/17 2359   05/12/17 0900  vancomycin (VANCOCIN) IVPB 1000 mg/200 mL premix  Status:  Discontinued     1,000 mg 200 mL/hr over 60 Minutes Intravenous Every 12 hours 05/12/17 0750 05/16/17 0852   05/12/17 0830  aztreonam (AZACTAM) 2 GM IVPB     2 g 100 mL/hr over 30 Minutes Intravenous  Once 05/12/17 0800 05/12/17 0957   05/06/17 0916  vancomycin (VANCOCIN) IVPB 1000 mg/200 mL premix     1,000 mg 200 mL/hr over 60 Minutes Intravenous On call to O.R. 05/06/17 0916 05/06/17 1229      Objective: Physical Exam: Vitals:   06/17/17 0400 06/17/17 0800 06/17/17 0925 06/17/17 1200  BP: 99/70     Pulse: 94     Resp: 20 (!) 24  (!) 24  Temp:  98.7 F (37.1 C)  98.1 F (36.7 C)  TempSrc:  Oral  Oral  SpO2: 99% 100% 97% 96%  Weight:      Height:        Intake/Output Summary (Last 24 hours) at 06/17/17 1410 Last data filed at 06/17/17 0700  Gross per 24 hour  Intake           836.42 ml  Output             2260 ml  Net         -1423.58 ml   Filed Weights   06/14/17 0500 06/15/17 0403 06/17/17 0356  Weight: 81.4 kg (179 lb 7.3 oz) 81.9 kg (180 lb 8.9 oz) 83.6 kg (184 lb 4.9 oz)   FIE:PPIRJJOACZY ill-appearing African-American male, resting in bed, no distress HEENT: PERRLA, Neck supple, no JVD, thick whitish, pink exudate on the tongue  Lungs: Few basilar crackles noted respiratory effort normal,  CVS: RRR,No Gallops,Rubs or new Murmurs Abd: softmild diffuse tenderness, bowel sounds present, abdominal wounds with Foley catheters for drainage  Skin: no new rashes Psychiatric : Flat affect    Data Reviewed: CBC:  Recent Labs Lab 06/11/17 0804  06/12/17 0607 06/13/17 0430 06/14/17 0400 06/15/17 0809 06/16/17 0428  WBC 12.2*  --  11.1* 10.2 10.9* 11.0*  11.3*  NEUTROABS 7.1  --   --   --   --  5.1  --   HGB 7.7*  < > 8.8* 8.2* 8.6* 8.8* 9.0*  HCT 24.2*  < > 27.4* 25.5* 26.7* 26.8* 28.0*  MCV 80.9  --  79.9 85.3 80.4 78.6 79.5  PLT 503*  --  453* 430* 433* 452* 493*  < > = values  in this interval not displayed. Basic Metabolic Panel:  Recent Labs Lab 06/13/17 0430 06/14/17 0400 06/15/17 0809 06/16/17 0428 06/17/17 0450  NA 140 137 135 135 130*  K 3.3* 3.8 4.2 4.5 4.6  CL 106 106 103 103 100*  CO2 26 25 25 24 23   GLUCOSE 126* 108* 94 91 125*  BUN 22* 23* 21* 21* 23*  CREATININE 0.69 0.63 0.70 0.74 0.76  CALCIUM 8.8* 8.8* 8.9 9.2 9.2  MG 1.6* 1.6* 1.8 1.7 1.5*  PHOS 5.0* 4.8* 5.0* 5.6* 4.7*   Liver Function Tests:  Recent Labs Lab 06/11/17 0526 06/12/17 0607 06/14/17 0400 06/15/17 0809  AST 27 23 21 20   ALT 21 18 18 19   ALKPHOS 165* 145* 123 139*  BILITOT 0.6 0.8 1.1 1.2  PROT 7.0 6.8 6.6 6.9  ALBUMIN 2.4* 2.4* 2.3* 2.2*   No results for input(s): LIPASE, AMYLASE in the last 168 hours. No results for input(s): AMMONIA in the last 168 hours. Cardiac Enzymes: No results for input(s): CKTOTAL, CKMB, CKMBINDEX, TROPONINI in the last 168 hours. BNP (last 3 results) No results for input(s): BNP in the last 8760 hours. CBG:  Recent Labs Lab 06/16/17 1217 06/16/17 1747 06/16/17 2344 06/17/17 0633 06/17/17 1219  GLUCAP 111* 115* 117* 125* 125*   Recent Results (from the past 240 hour(s))  MRSA PCR Screening     Status: None   Collection Time: 06/11/17  5:59 PM  Result Value Ref Range Status   MRSA by PCR NEGATIVE NEGATIVE Final    Comment:        The GeneXpert MRSA Assay (FDA approved for NASAL specimens only), is one component of a comprehensive MRSA colonization surveillance program. It is not intended to diagnose MRSA infection nor to guide or monitor treatment for MRSA infections.     Studies: No results found.   Scheduled Meds: . chlorhexidine  15 mL Mouth Rinse BID  . Chlorhexidine Gluconate  Cloth  6 each Topical Q1200  . chlorpheniramine-HYDROcodone  5 mL Oral Q12H  . feeding supplement  1 Container Oral BID BM  . furosemide  20 mg Oral Daily  . HYDROmorphone   Intravenous Q4H  . insulin aspart  0-15 Units Subcutaneous Q6H  . ipratropium-albuterol  3 mL Nebulization BID  . lip balm  1 application Topical BID  . LORazepam  2 mg Oral Q6H  . mouth rinse  15 mL Mouth Rinse q12n4p  . nicotine  21 mg Transdermal Daily  . nystatin  5 mL Oral TID  . oxyCODONE  10 mg Per Tube Q4H  . pantoprazole (PROTONIX) IV  40 mg Intravenous Q12H  . saccharomyces boulardii  250 mg Oral BID  . thiamine injection  100 mg Intravenous Daily   Continuous Infusions: . sodium chloride 10 mL/hr at 06/17/17 0741  . chlorproMAZINE (THORAZINE) IV 12.5 mg (06/17/17 0830)  . famotidine (PEPCID) IV Stopped (06/17/17 0810)  . fat emulsion    . TPN (CLINIMIX) Adult without lytes 115 mL/hr at 06/16/17 1841  . TPN (CLINIMIX) Adult without lytes     PRN Meds: acetaminophen, butalbital-acetaminophen-caffeine, chlorproMAZINE (THORAZINE) IV, diphenhydrAMINE, HYDROmorphone, iopamidol, labetalol, LORazepam, magic mouthwash, menthol-cetylpyridinium, naloxone **AND** sodium chloride flush, ondansetron **OR** ondansetron (ZOFRAN) IV, phenol, sodium chloride flush  Time spent: 35 minutes  Author: Domenic Polite, MD [facility] Page via [email or web address], password [email or web address]  06/17/2017 2:10 PM  If 7PM-7AM, please contact night-coverage at [email or web address], password [facility]

## 2017-06-17 NOTE — Progress Notes (Signed)
Occupational Therapy Treatment Patient Details Name: [name] [name] MRN: [id number] DOB: [birth year] Today's Date: 06/17/2017    History of present illness  56 y.o. 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 On 7/18, he developed a leak therefore was taken back to OR for re-do of ex-lap and repair of leak on 05/20/17.  He returned to the ICU on the vent . Extubated on 05/28/17.   OT comments  Pt with increased HR today; decreased activity tolerance due to this and pain.  OT had woken pt up; he started to fall back to sleep when he was assisted to chair  Follow Up Recommendations  SNF    Equipment Recommendations  3 in 1 bedside commode    Recommendations for Other Services      Precautions / Restrictions Precautions Precautions: Fall Precaution Comments: multiple lines in abdomen, VAC, NG, and JP drain       Mobility Bed Mobility           Sit to supine: Min assist   General bed mobility comments: cued pt to roll over, but he started to sit up without rolling  Transfers   Equipment used: Rolling walker (2 wheeled)   Sit to Stand: Min assist;From elevated surface;+2 physical assistance;+2 safety/equipment Stand pivot transfers: Min assist;+2 physical assistance       General transfer comment: assist to rise and steady.  Cues for UE placement    Balance                                           ADL either performed or assessed with clinical judgement   ADL                                         General ADL Comments: Pt transferred to recliner only as HR went up to 141 from 115.  Pt used PCA and rested at EOB due to R side pain.  Pt sleepy; did not want to perform any adl activities from chair     Vision       Perception     Praxis       Cognition Arousal/Alertness: Awake/alert Behavior During Therapy: WFL for tasks assessed/performed Overall Cognitive Status: Within Functional Limits for tasks assessed                                          Exercises     Shoulder Instructions       General Comments      Pertinent Vitals/ Pain       Pain Assessment: Faces Faces Pain Scale: Hurts whole lot Pain Location: R side Pain Descriptors / Indicators: Grimacing;Sore Pain Intervention(s): Limited activity within patient's tolerance;Monitored during session;Premedicated before session;PCA encouraged  Home Living                                          Prior Functioning/Environment  Frequency  Min 2X/week        Progress Toward Goals  OT Goals(current goals can now be found in the care plan section)  Progress towards OT goals: Progressing toward goals (slowly)  Acute Rehab OT Goals Time For Goal Achievement: 06/26/17 Potential to Achieve Goals: Good  Plan      Co-evaluation                 AM-PAC PT "6 Clicks" Daily Activity     Outcome Measure   Help from another person eating meals?: A Little Help from another person taking care of personal grooming?: A Little Help from another person toileting, which includes using toliet, bedpan, or urinal?: A Lot Help from another person bathing (including washing, rinsing, drying)?: A Lot Help from another person to put on and taking off regular upper body clothing?: A Lot Help from another person to put on and taking off regular lower body clothing?: Total 6 Click Score: 13    End of Session    OT Visit Diagnosis: Unsteadiness on feet (R26.81);Muscle weakness (generalized) (M62.81)   Activity Tolerance Patient limited by fatigue;Patient limited by pain;Other (comment) (HR)   Patient Left in chair;with call bell/phone within reach;with chair alarm set   Nurse Communication          Time:  4920-1007 OT Time Calculation (min): 28 min  Charges: OT General Charges $OT Visit: 1 Procedure OT Treatments $Therapeutic Activity: 23-37 mins  [name], OTR/L [phone] 06/17/2017   [name] [name] 06/17/2017, 1:54 PM

## 2017-06-17 NOTE — Consult Note (Signed)
[facility] Nurse wound follow up Wound type:Full thickness surgical NPWT dressing change Measurement:6cm x 22cm x 1.8cm Wound bed: 60% pink granulating tissue, 20% beefy red granulating tissue, 20% brown fecal material under distal portion of mesh Drainage (amount, consistency, odor) small amt of brown feculent drainage in tubing and canister, malodorous. Periwound: Wound edges closed from 3-9 o'clock and 10-2 o'clock Dressing procedure/placement/frequency: Removed drape with aerosol Adhesive Remover, wound bed cleansed with NS, one piece of white foam and two pieces of black foam used to obliterate dead space. Drape applied, attached to 125 mmHg and seal achieved.  Bedside RN, Crowley assisted. Patient's wife, Retia present along with Dr. Hassell Done and his PA. Dr. Hassell Done stated he was pleased with how the wound looked. Supplies ordered for room for next dressing change which is scheduled 8/24.  We will follow this patient and return Friday 8/24 for next scheduled NWPT dressing change.  Fara Olden, RN-C, WTA-C, [facility] Wound Treatment Associate

## 2017-06-17 NOTE — Progress Notes (Signed)
Nutrition Follow-up  INTERVENTION:   -TPN per Pharmacy -Provide Boost Breeze po BID, each supplement provides 250 kcal and 9 grams of protein  RD will continue to monitor  NUTRITION DIAGNOSIS:   Inadequate oral intake related to inability to eat as evidenced by NPO status.  Ongoing.  GOAL:   Patient will meet greater than or equal to 90% of their needs  Meeting with TPN at goal rate.  MONITOR:   Supplement acceptance, Diet advancement, Labs, Weight trends, Skin, I & O's (TPN)  REASON FOR ASSESSMENT:   Consult Assessment of nutrition requirement/status  ASSESSMENT:   56 y.o. 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 On 7/18, he developed a leak therefore was taken back to OR for re-do of ex-lap and repair of leak.  Significant events: 7/11 PY:KDXIP adrenalectomy with complication of intestinal injury and small bowel resection with anastomosis  7/18 JA:SNKNLZ of small bowel anastomotic leak, abdominal closure of wound dehisence 7/19 OR: reopening of recent laparotomy, lysis of adhesions, noted ischemic perforation of new loop of intestine, intact repair of previous anastomotic leak repair, intact previous anastomosis.  7/22 OR: wash out, tube ileostomy, left with open abdomen &wound vac in place. Plan for OR on 7/25 for closure 7/25 OR: mesh placed for abdominal closure, wound vac 8/2extubated, propofol drip off 8/4TPN off for ~ 5 hours due to line detached from filter. Ultrasound-guided aspiration By interventional radiology of small perihepatic fluid collection yielded only 1 mL of the fluid, sent for culture. NPO - mild coffee ground on NG but no active bleed 8/6patient improving. Off Precedex drip since 8/4. Dilaudid Drip being changed over to PCA. Still with some coffee  ground-looking material in NG tube.  8/10 hyponatremia and plan to decrease TPN from 3L/day to 2L/day x1 week 8/71found to have fecal matter in wound vac 8/14increase in fecal matter presence in wound vac with no plan for surgery at this time 8/16TPN advanced to goal rate of 115 ml/hr with 20% ILE @ 20 mL/hr x12 hours on MWF 8/22: NGT clamped. RD to order Boost Breeze for patient to sip on.  RD consulted for recommendations regarding elemental liquids. Spoke with Claiborne Billings PA states she would like patient to have a supplement to sip on while NGT is clamped given his wound. RD to order Boost Breeze supplements for patient to try.   Pt continues to receive Clinimix E 5/15 @ 115 mL/hr with 20% ILE @ 20 mL/hr x12 hours on MWF (2164 kcal, 138g protein).  Medications: Lasix tablet daily, IV Protonix BID, Florastor capsule BID, IV Thiamine daily  Labs reviewed: CBGs: 125 Low Na, Mg Elevated Phos  Plan per Pharmacy 8/22: Now: Magnesium 4g IV x1 dose  At 1800 today: ? Continue Clinimix 5/15 at 115 ml/hr.   NO ELECTROLYTES due to elevated Phosphorus  Add sodium to make equivalent to 0.45% NaCl, above standard Clinimix E formulation (add 75 mEq of Na/L in a 1:2 JQ:[id number] ratio) ? Lipids at 100mls/hr x 12 hours 3 times weekly on MWF  Diet Order:  TPN (CLINIMIX) Adult without lytes Diet clear liquid Room service appropriate? Yes; Fluid consistency: Thin TPN (CLINIMIX) Adult without lytes  Skin:  Wound: Incisions to R flank and abdomen from 7/11 and 7/18), 7/19, 7/22, 7/25  Last BM:  8/12 via ileostomy  Height:   Ht Readings  from Last 1 Encounters:  05/26/17 5\' 10"  (1.778 m)    Weight:   Wt Readings from Last 1 Encounters:  06/17/17 184 lb 4.9 oz (83.6 kg)    Ideal Body Weight:  75.45 kg  BMI:  Body mass index is 26.44 kg/m.  Estimated Nutritional Needs:   Kcal:  2030-2270 (25-28 kcal/kg)  Protein:  122-138 grams (1.5-1.7 grams/kg)  Fluid:  2 L/day  EDUCATION NEEDS:    No education needs identified at this time  [name] Bibles, MS, RD, LDN Pager: [phone] After Hours Pager: [phone]

## 2017-06-17 NOTE — Progress Notes (Signed)
[facility] Progress Note  28 Days Post-Op  Subjective: CC: abdominal pain Patient with continued abdominal pain. NG causing throat irritation.   Objective: Vital signs in last 24 hours: Temp:  [97.6 F (36.4 C)-98.9 F (37.2 C)] 97.6 F (36.4 C) (08/21 2334) Pulse Rate:  [25-101] 94 (08/22 0400) Resp:  [12-24] 24 (08/22 0800) BP: (99-135)/(68-99) 99/70 (08/22 0400) SpO2:  [96 %-100 %] 100 % (08/22 0800) Weight:  [83.6 kg (184 lb 4.9 oz)] 83.6 kg (184 lb 4.9 oz) (08/22 0356) Last BM Date: 06/07/17  Intake/Output from previous day: 08/21 0701 - 08/22 0700 In: 1433.3 [I.V.:1163.3; NG/GT:120; IV Piggyback:150] Out: 2710 [Urine:1250; Emesis/NG output:200; Drains:1260] Intake/Output this shift: No intake/output data recorded.  PE: Gen:  Alert, NAD Head: NGT present Pulm:  Normal effort Abd: RUQ wound as below, two foleys in place and draining   Psych: A&Ox3   Lab Results:   Recent Labs  06/15/17 0809 06/16/17 0428  WBC 11.0* 11.3*  HGB 8.8* 9.0*  HCT 26.8* 28.0*  PLT 452* 493*   BMET  Recent Labs  06/16/17 0428 06/17/17 0450  NA 135 130*  K 4.5 4.6  CL 103 100*  CO2 24 23  GLUCOSE 91 125*  BUN 21* 23*  CREATININE 0.74 0.76  CALCIUM 9.2 9.2   CMP     Component Value Date/Time   NA 130 (L) 06/17/2017 0450   K 4.6 06/17/2017 0450   CL 100 (L) 06/17/2017 0450   CO2 23 06/17/2017 0450   GLUCOSE 125 (H) 06/17/2017 0450   BUN 23 (H) 06/17/2017 0450   CREATININE 0.76 06/17/2017 0450   CREATININE 0.85 07/22/2013 1021   CALCIUM 9.2 06/17/2017 0450   PROT 6.9 06/15/2017 0809   ALBUMIN 2.2 (L) 06/15/2017 0809   AST 20 06/15/2017 0809   ALT 19 06/15/2017 0809   ALKPHOS 139 (H) 06/15/2017 0809   BILITOT 1.2 06/15/2017 0809   GFRNONAA >60 06/17/2017 0450   GFRAA >60 06/17/2017 0450   Lipase     Component Value Date/Time   LIPASE 27 09/15/2015 0934    Anti-infectives: Anti-infectives    Start     Dose/Rate Route Frequency Ordered  Stop   06/16/17 1200  piperacillin-tazobactam (ZOSYN) IVPB 3.375 g     3.375 g 12.5 mL/hr over 240 Minutes Intravenous Every 8 hours 06/16/17 1026 06/17/17 0004   06/08/17 1200  piperacillin-tazobactam (ZOSYN) IVPB 3.375 g  Status:  Discontinued     3.375 g 12.5 mL/hr over 240 Minutes Intravenous Every 8 hours 06/08/17 1056 06/16/17 1026   05/29/17 2200  ceFEPIme (MAXIPIME) 2 g in dextrose 5 % 50 mL IVPB  Status:  Discontinued     2 g 100 mL/hr over 30 Minutes Intravenous Every 8 hours 05/29/17 2031 06/03/17 0832   05/29/17 2100  metroNIDAZOLE (FLAGYL) IVPB 500 mg  Status:  Discontinued     500 mg 100 mL/hr over 60 Minutes Intravenous Every 8 hours 05/29/17 2029 06/03/17 0832   05/26/17 2200  ceFAZolin (ANCEF) IVPB 1 g/50 mL premix  Status:  Discontinued     1 g 100 mL/hr over 30 Minutes Intravenous Every 8 hours 05/26/17 1008 05/29/17 2023   05/25/17 1100  vancomycin (VANCOCIN) 1,250 mg in sodium chloride 0.9 % 250 mL IVPB  Status:  Discontinued     1,250 mg 166.7 mL/hr over 90 Minutes Intravenous Every 12 hours 05/25/17 0955 05/26/17 0946   05/22/17 1400  anidulafungin (ERAXIS) 100 mg in sodium chloride 0.9 % 100  mL IVPB     100 mg 78 mL/hr over 100 Minutes Intravenous Every 24 hours 05/21/17 1330 05/27/17 1740   05/21/17 1400  anidulafungin (ERAXIS) 200 mg in sodium chloride 0.9 % 200 mL IVPB     200 mg 78 mL/hr over 200 Minutes Intravenous  Once 05/21/17 1330 05/21/17 1940   05/15/17 1000  anidulafungin (ERAXIS) 100 mg in sodium chloride 0.9 % 100 mL IVPB  Status:  Discontinued     100 mg 78 mL/hr over 100 Minutes Intravenous Every 24 hours 05/14/17 0829 05/16/17 0901   05/15/17 1000  metroNIDAZOLE (FLAGYL) IVPB 500 mg  Status:  Discontinued     500 mg 100 mL/hr over 60 Minutes Intravenous Every 8 hours 05/15/17 0903 05/25/17 0938   05/14/17 0900  anidulafungin (ERAXIS) 200 mg in sodium chloride 0.9 % 200 mL IVPB     200 mg 78 mL/hr over 200 Minutes Intravenous  Once 05/14/17  0829 05/14/17 1325   05/13/17 1927  vancomycin (VANCOCIN) 1-5 GM/200ML-% IVPB    Comments:  Ward, Christa   : cabinet override      05/13/17 1927 05/14/17 0744   05/12/17 1400  ceFEPIme (MAXIPIME) 1 g in dextrose 5 % 50 mL IVPB     1 g 100 mL/hr over 30 Minutes Intravenous Every 8 hours 05/12/17 0939 05/26/17 2359   05/12/17 0900  vancomycin (VANCOCIN) IVPB 1000 mg/200 mL premix  Status:  Discontinued     1,000 mg 200 mL/hr over 60 Minutes Intravenous Every 12 hours 05/12/17 0750 05/16/17 0852   05/12/17 0830  aztreonam (AZACTAM) 2 GM IVPB     2 g 100 mL/hr over 30 Minutes Intravenous  Once 05/12/17 0800 05/12/17 0957   05/06/17 0916  vancomycin (VANCOCIN) IVPB 1000 mg/200 mL premix     1,000 mg 200 mL/hr over 60 Minutes Intravenous On call to O.R. 05/06/17 0916 05/06/17 1229       Assessment/Plan On chronic pain meds - Takes about 40 mg oxycodone per day for neck - goes to pain clinic - sees Dr. [name] for neck HTN COPD/Smokes  OPEN RIGHT ADRENALECTOMY WITH SMALL BOWEL RESECTION AND ANASTOMOSIS - 05/06/2017 - [name] - Additional surgery - 05/13/2017, 05/14/2017, 05/17/2017 and 05/20/2017 - Currently with distal ileum in discontinuity and catheter drainage of the 2 bowel ends. Now has new apparent enteric leak from a separate anastomosis communicating to the anterior abdomen and the VAC dressing.  - Last CT scan on 08/14 - continue VAC M, W, F Malnutrition - On TPN  - allow sips of elemental fluids with NGT clamped Anemia - Hgb - 9.0 - 06/16/2017  FEN: IVF, TPN, NGT, elemental liquids VTE: SCD's, lovenox ID: Zosyn- 06/07/2017 >06/16/17 Foley: none Follow up: TBD  DISPO: Start probiotic and allow patient to have elemental liquids with NGT clamped. Continue dressing changes. Can probably transfer out of unit.   LOS: 42 days    Brigid Re , [facility] 06/17/2017, 8:49 AM Pager: [phone] Consults: [phone]  Agree with  above. Seen by Dr. Hassell Done.  Alphonsa Overall, MD, [facility] Surgery Pager: [phone] Office phone:  [phone]

## 2017-06-17 NOTE — Progress Notes (Signed)
06/17/17  1225 Paged Claiborne Billings PA, to make sure they were aware of HR 115-120's. Per Claiborne Billings she is aware.

## 2017-06-17 NOTE — Progress Notes (Signed)
[place] NOTE   Pharmacy Consult for TPN Indication: prolonged ileus, small bowel leak, multiple bowel surgeries  Patient Measurements: Body mass index is 26.44 kg/m. Filed Weights   06/14/17 0500 06/15/17 0403 06/17/17 0356  Weight: 179 lb 7.3 oz (81.4 kg) 180 lb 8.9 oz (81.9 kg) 184 lb 4.9 oz (83.6 kg)   HPI: 41 yoM admitted on 7/11 for enlarging adrenal mass and planned adrenalectomy.  Pharmacy consulted to dose TPN.  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   7/22 OR:  wash out, tube ileostomy, left with open abdomen & wound vac in place.  Plan for OR on 7/25 for closure 7/25 OR: mesh placed for abdominal closure, wound vac 8/2 extubated, propofol drip off 8/4 TPN off for ~ 5 hours due to line detached from filter 8/10 MD request decrease rate x 1 week 2nd low sodium 8/12 Additional sodium added to TPN due to persistent hyponatremia 8/16 Increasing rate back to goal since sodium has improved with above intervention.   8/20 Prealbumin decreased (possibly related to TPN rate below goal from 8/10 - 8/16?).  New apparent enteric leak per surgery notes. 8/21 CaPhos product elevated, remove electrolytes from TPN  Insulin requirements past 24 hours: 2 units SSI (no hx DM)  Current Nutrition: NPO, ice chips, popsicles  IVF: NS at 10 ml/hr  Central access: PICC line ordered 7/19, placed on 7/20 TPN start date: 7/20  ASSESSMENT                                                                                                           Today, 06/17/17  - Glucose: CBGs at goal  (goal < 150) - Electrolytes:  K 4.6, WNL  Na 130, decreased despite maintaining standard Clinimix Na concentration in TPN.   Mag low at 1.5, Phos 4.7 is elevated but improved after removing elytes from TPN   CorrCa 10.64, CaxPhos = 50 (goal < 55), improved - Renal:  SCr wnl, stable. Recorded I/O net -1.2L, NG output 244mL, Drain output 1260 mL - Weight: 83.6 kg,  89.4kg on hospital admission - LFTs: WNL (8/20) - TGs:  296 (7/18), 266 (7/20), 269 (7/23), 210 (7/28), 285 (7/31) 301 (8/3), increased 323 (8/14), improved to 238 (8/20) - Prealbumin:  <5 (7/20), 6.1 (7/24), 10.1 (7/30) 24.8 (8/6), 26.4 (8/13), decreased to 15.9 (8/20)  NUTRITIONAL GOALS  RD recs (8/20):  122-138 gms Protein (1.5 - 1.7 g/kg) 2030 - 2270 Kcal  Recalculation: Clinimix 5/15 at rate of 115 ml/hr with lipids 3x/week on MWF only would provide: 138g protein and avg 2164 kcal/day  - Glucose infusion rate will be 3.53 mg/kg/min (Maximum 5 mg/kg/min)   PLAN                                                                                                                         Now: Magnesium 4g IV x1 dose  At 1800 today: ? Continue Clinimix 5/15 at 115 ml/hr.  ? NO ELECTROLYTES due to elevated Phosphorus ? Add sodium to make equivalent to 0.45% NaCl, above standard Clinimix E formulation (add 75 mEq of Na/L in a 1:2 ZJ:IRCVELF ratio) ? Lipids at 29mls/hr x 12 hours 3 times weekly on MWF  TPN to contain standard multivitamins and trace elements daily  Maintenance IVF NS at 10 ml/hr  CBGs and moderate SSI q6h.  TPN lab panels on Mondays & Thursdays.  Check BMET, Mag & Phos daily   Gretta Arab PharmD, BCPS Pager [phone] 06/17/2017 7:33 AM

## 2017-06-18 LAB — COMPREHENSIVE METABOLIC PANEL
ALBUMIN: 2.7 g/dL — AB (ref 3.5–5.0)
ALK PHOS: 192 U/L — AB (ref 38–126)
ALT: 24 U/L (ref 17–63)
ANION GAP: 8 (ref 5–15)
AST: 25 U/L (ref 15–41)
BUN: 27 mg/dL — ABNORMAL HIGH (ref 6–20)
CALCIUM: 9.1 mg/dL (ref 8.9–10.3)
CO2: 26 mmol/L (ref 22–32)
Chloride: 99 mmol/L — ABNORMAL LOW (ref 101–111)
Creatinine, Ser: 0.77 mg/dL (ref 0.61–1.24)
GFR calc non Af Amer: >60 mL/min (ref >60)
GLUCOSE: 115 mg/dL — AB (ref 65–99)
POTASSIUM: 4.1 mmol/L (ref 3.5–5.1)
SODIUM: 133 mmol/L — AB (ref 135–145)
TOTAL PROTEIN: 7.4 g/dL (ref 6.5–8.1)
Total Bilirubin: 0.9 mg/dL (ref 0.3–1.2)

## 2017-06-18 LAB — MAGNESIUM: MAGNESIUM: 1.8 mg/dL (ref 1.7–2.4)

## 2017-06-18 LAB — PHOSPHORUS: Phosphorus: 5.3 mg/dL — ABNORMAL HIGH (ref 2.5–4.6)

## 2017-06-18 LAB — GLUCOSE, CAPILLARY
GLUCOSE-CAPILLARY: 119 mg/dL — AB (ref 65–99)
Glucose-Capillary: 119 mg/dL — ABNORMAL HIGH (ref 65–99)
Glucose-Capillary: 131 mg/dL — ABNORMAL HIGH (ref 65–99)
Glucose-Capillary: 124 mg/dL — ABNORMAL HIGH (ref 65–99)

## 2017-06-18 MED ORDER — MAGNESIUM SULFATE 2 GM/50ML IV SOLN
2.0000 g | Freq: Once | INTRAVENOUS | Status: AC
  Administered 2017-06-18: 2 g via INTRAVENOUS
  Filled 2017-06-18: qty 50

## 2017-06-18 MED ORDER — ENOXAPARIN SODIUM 40 MG/0.4ML ~~LOC~~ SOLN
40.0000 mg | SUBCUTANEOUS | Status: DC
  Administered 2017-06-18 – 2017-07-29 (×42): 40 mg via SUBCUTANEOUS
  Filled 2017-06-18 (×42): qty 0.4

## 2017-06-18 MED ORDER — TRACE MINERALS CR-CU-MN-SE-ZN 10-1000-500-60 MCG/ML IV SOLN
INTRAVENOUS | Status: AC
  Administered 2017-06-18: 17:00:00 via INTRAVENOUS
  Filled 2017-06-18: qty 2000
  Filled 2017-06-18: qty 2760

## 2017-06-18 NOTE — Progress Notes (Signed)
[facility] Progress Note  29 Days Post-Op  Subjective: CC: RUQ pain Pain in right abdomen is worse today. Had NGT clamped yesterday and took some clears, but had some nausea with this. Feeling SOB at times. Throat is still sore, started on nystatin for oral candidiasis.  Objective: Vital signs in last 24 hours: Temp:  [98.1 F (36.7 C)-98.7 F (37.1 C)] 98.2 F (36.8 C) (08/23 0306) Pulse Rate:  [97-128] 97 (08/23 0400) Resp:  [18-25] 20 (08/23 0400) BP: (102-145)/(71-97) 145/97 (08/23 0400) SpO2:  [96 %-100 %] 99 % (08/23 0400) Last BM Date: 06/07/17  Intake/Output from previous day: 08/22 0701 - 08/23 0700 In: 2180.5 [I.V.:2000.5; NG/GT:30; IV Piggyback:150] Out: 1855 [Urine:725; Emesis/NG output:450; Drains:680] Intake/Output this shift: No intake/output data recorded.  PE: Gen:  Alert, NAD Head: NGT present Card:  Regular rate and rhythm Pulm:  Normal effort, clear to auscultation bilaterally Abd: VAC dressing in RUQ with good seal; two foleys in place and draining; RLQ JP with small amount of drainage; Normoactive bowel sounds. Skin: warm and dry, no rashes  Psych: A&Ox3   Lab Results:   Recent Labs  06/15/17 0809 06/16/17 0428  WBC 11.0* 11.3*  HGB 8.8* 9.0*  HCT 26.8* 28.0*  PLT 452* 493*   BMET  Recent Labs  06/17/17 0450 06/18/17 0407  NA 130* 133*  K 4.6 4.1  CL 100* 99*  CO2 23 26  GLUCOSE 125* 115*  BUN 23* 27*  CREATININE 0.76 0.77  CALCIUM 9.2 9.1   CMP     Component Value Date/Time   NA 133 (L) 06/18/2017 0407   K 4.1 06/18/2017 0407   CL 99 (L) 06/18/2017 0407   CO2 26 06/18/2017 0407   GLUCOSE 115 (H) 06/18/2017 0407   BUN 27 (H) 06/18/2017 0407   CREATININE 0.77 06/18/2017 0407   CREATININE 0.85 07/22/2013 1021   CALCIUM 9.1 06/18/2017 0407   PROT 7.4 06/18/2017 0407   ALBUMIN 2.7 (L) 06/18/2017 0407   AST 25 06/18/2017 0407   ALT 24 06/18/2017 0407   ALKPHOS 192 (H) 06/18/2017 0407   BILITOT 0.9 06/18/2017  0407   GFRNONAA >60 06/18/2017 0407   GFRAA >60 06/18/2017 0407   Lipase     Component Value Date/Time   LIPASE 27 09/15/2015 0934    Anti-infectives: Anti-infectives    Start     Dose/Rate Route Frequency Ordered Stop   06/16/17 1200  piperacillin-tazobactam (ZOSYN) IVPB 3.375 g     3.375 g 12.5 mL/hr over 240 Minutes Intravenous Every 8 hours 06/16/17 1026 06/17/17 0004   06/08/17 1200  piperacillin-tazobactam (ZOSYN) IVPB 3.375 g  Status:  Discontinued     3.375 g 12.5 mL/hr over 240 Minutes Intravenous Every 8 hours 06/08/17 1056 06/16/17 1026   05/29/17 2200  ceFEPIme (MAXIPIME) 2 g in dextrose 5 % 50 mL IVPB  Status:  Discontinued     2 g 100 mL/hr over 30 Minutes Intravenous Every 8 hours 05/29/17 2031 06/03/17 0832   05/29/17 2100  metroNIDAZOLE (FLAGYL) IVPB 500 mg  Status:  Discontinued     500 mg 100 mL/hr over 60 Minutes Intravenous Every 8 hours 05/29/17 2029 06/03/17 0832   05/26/17 2200  ceFAZolin (ANCEF) IVPB 1 g/50 mL premix  Status:  Discontinued     1 g 100 mL/hr over 30 Minutes Intravenous Every 8 hours 05/26/17 1008 05/29/17 2023   05/25/17 1100  vancomycin (VANCOCIN) 1,250 mg in sodium chloride 0.9 % 250 mL IVPB  Status:  Discontinued     1,250 mg 166.7 mL/hr over 90 Minutes Intravenous Every 12 hours 05/25/17 0955 05/26/17 0946   05/22/17 1400  anidulafungin (ERAXIS) 100 mg in sodium chloride 0.9 % 100 mL IVPB     100 mg 78 mL/hr over 100 Minutes Intravenous Every 24 hours 05/21/17 1330 05/27/17 1740   05/21/17 1400  anidulafungin (ERAXIS) 200 mg in sodium chloride 0.9 % 200 mL IVPB     200 mg 78 mL/hr over 200 Minutes Intravenous  Once 05/21/17 1330 05/21/17 1940   05/15/17 1000  anidulafungin (ERAXIS) 100 mg in sodium chloride 0.9 % 100 mL IVPB  Status:  Discontinued     100 mg 78 mL/hr over 100 Minutes Intravenous Every 24 hours 05/14/17 0829 05/16/17 0901   05/15/17 1000  metroNIDAZOLE (FLAGYL) IVPB 500 mg  Status:  Discontinued     500 mg 100  mL/hr over 60 Minutes Intravenous Every 8 hours 05/15/17 0903 05/25/17 0938   05/14/17 0900  anidulafungin (ERAXIS) 200 mg in sodium chloride 0.9 % 200 mL IVPB     200 mg 78 mL/hr over 200 Minutes Intravenous  Once 05/14/17 0829 05/14/17 1325   05/13/17 1927  vancomycin (VANCOCIN) 1-5 GM/200ML-% IVPB    Comments:  Ward, Christa   : cabinet override      05/13/17 1927 05/14/17 0744   05/12/17 1400  ceFEPIme (MAXIPIME) 1 g in dextrose 5 % 50 mL IVPB     1 g 100 mL/hr over 30 Minutes Intravenous Every 8 hours 05/12/17 0939 05/26/17 2359   05/12/17 0900  vancomycin (VANCOCIN) IVPB 1000 mg/200 mL premix  Status:  Discontinued     1,000 mg 200 mL/hr over 60 Minutes Intravenous Every 12 hours 05/12/17 0750 05/16/17 0852   05/12/17 0830  aztreonam (AZACTAM) 2 GM IVPB     2 g 100 mL/hr over 30 Minutes Intravenous  Once 05/12/17 0800 05/12/17 0957   05/06/17 0916  vancomycin (VANCOCIN) IVPB 1000 mg/200 mL premix     1,000 mg 200 mL/hr over 60 Minutes Intravenous On call to O.R. 05/06/17 0916 05/06/17 1229       Assessment/Plan On chronic pain meds - Takes about 40 mg oxycodone per day for neck - goes to pain clinic - sees Dr. [name] for neck HTN COPD/Smokes  OPEN RIGHT ADRENALECTOMY WITH SMALL BOWEL RESECTION AND ANASTOMOSIS - 05/06/2017 - [name] - Additional surgery - 05/13/2017, 05/14/2017, 05/17/2017 and 05/20/2017 - Currently with distal ileum in discontinuity and catheter drainage of the 2 bowel ends. Now has new apparent enteric leak from a separate anastomosis communicating to the anterior abdomen and the VAC dressing.  - Last CT scan on 08/14 - will discuss with MD whether patient needs repeat scan  - continue VAC M, W, F Malnutrition - On TPN  - allow sips of elemental fluids with NGT clamped Anemia - Hgb - 9.1- 06/18/2017  FEN: IVF, TPN, NGT, elemental liquids VTE: SCD's, lovenox ID: Zosyn- 06/07/2017 >06/16/17; florastor (8/22>>) Foley: none Follow up:  TBD  DISPO: Continue current management. Encourage patient to get up and out of the bed. Will discuss with MD if patient needs repeat CT. --I think that his right sided pain is incisional.  Will hold off on CT scan for now.    LOS: 43 days    Brigid Re , [facility] 06/18/2017, 7:22 AM Pager: [phone] Consults: [phone] Mon-Fri 7:00 am-4:30 pm Sat-Sun 7:00 am-11:30 am

## 2017-06-18 NOTE — Progress Notes (Signed)
NUTRITION NOTE  Full assessment done yesterday with associated note at 1:28 PM. Pt drowsy during RD visit so discussion was had with his wife, who is at bedside. She reports that pt had about 1/3 of a bowl of beef broth for lunch and again for dinner. She is unsure if pt experienced pain or nausea with intakes (Surgery PA note pt had reported some nausea with PO intakes). Boost Breeze was ordered BID yesterday but wife does not think that pt has tried this item yet; she is very interested in him receiving it and consuming it.   Per Pharmacy note this AM, plan to continue Clinimix 5/15 @ 115 mL/hr with 20% ILE @ 20 mL/hr x12 hours MWF. Also adding sodium to TPN bags: add 75 mEq of Na/L in a 1:2 XT:[id number] ratio. TPN regimen is providing 2164 kcal and 138 grams of protein.  RD will continue to follow per protocol.    Estimated Nutritional Needs:  Kcal:  2030-2270 (25-28 kcal/kg) Protein:  122-138 grams (1.5-1.7 grams/kg) Fluid:  2 L/day     Jarome Matin, MS, RD, LDN, [facility] Inpatient Clinical Dietitian Pager # [phone] After hours/weekend pager # [phone]

## 2017-06-18 NOTE — Progress Notes (Signed)
[place] NOTE   Pharmacy Consult for TPN Indication: prolonged ileus, small bowel leak, multiple bowel surgeries  Patient Measurements: Body mass index is 26.44 kg/m. Filed Weights   06/14/17 0500 06/15/17 0403 06/17/17 0356  Weight: 179 lb 7.3 oz (81.4 kg) 180 lb 8.9 oz (81.9 kg) 184 lb 4.9 oz (83.6 kg)   HPI: 46 yoM admitted on 7/11 for enlarging adrenal mass and planned adrenalectomy.  Pharmacy consulted to dose TPN.  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   7/22 OR:  wash out, tube ileostomy, left with open abdomen & wound vac in place.  Plan for OR on 7/25 for closure 7/25 OR: mesh placed for abdominal closure, wound vac 8/2 extubated, propofol drip off 8/4 TPN off for ~ 5 hours due to line detached from filter 8/10 MD request decrease rate x 1 week 2nd low sodium 8/12 Additional sodium added to TPN due to persistent hyponatremia 8/16 Increasing rate back to goal since sodium has improved with above intervention.   8/20 Prealbumin decreased (possibly related to TPN rate below goal from 8/10 - 8/16?).  New apparent enteric leak per surgery notes. 8/21 CaPhos product elevated, remove electrolytes from TPN  Insulin requirements past 24 hours: 6 units SSI (no hx DM)  Current Nutrition: NPO, ice chips, popsicles  IVF: NS at 10 ml/hr  Central access: PICC line ordered 7/19, placed on 7/20 TPN start date: 7/20  ASSESSMENT                                                                                                           Today, 06/18/17  - Glucose: CBGs at goal  (goal < 150) - Electrolytes:  K 4.1, WNL  Na 133, increase from 130 after addition of equivalent to 0.45% NaCl to TPN   Mag  1.8, Phos 5.3 is elevated   CorrCa 10.1, CaxPhos = 53.5 (goal < 55), slightly up from 50 yesterday - Renal:  SCr wnl, stable. Recorded I/O net -915 mL, NG output 58mL, Drain output 1155 mL - Weight: 83.6 kg (8/22),  89.4kg on hospital admission - LFTs: WNL (8/20), alkaline phos elevated at 192, monitor - TGs:  296 (7/18), 266 (7/20), 269 (7/23), 210 (7/28), 285 (7/31) 301 (8/3), increased 323 (8/14), improved to 238 (8/20) - Prealbumin:  <5 (7/20), 6.1 (7/24), 10.1 (7/30) 24.8 (8/6), 26.4 (8/13), decreased to 15.9 (8/20)  NUTRITIONAL GOALS  RD recs (8/20):  122-138 gms Protein (1.5 - 1.7 g/kg) 2030 - 2270 Kcal  Recalculation: Clinimix 5/15 at rate of 115 ml/hr with lipids 3x/week on MWF only would provide: 138g protein and avg 2164 kcal/day  - Glucose infusion rate will be 3.53 mg/kg/min (Maximum 5 mg/kg/min)   PLAN                                                                                                                         Now: Magnesium 2g IV x1 dose  At 1800 today: ? Continue Clinimix 5/15 at 115 ml/hr.  ? NO ELECTROLYTES due to elevated Phosphorus ? Add sodium to make equivalent to 0.45% NaCl, above standard Clinimix E formulation (add 75 mEq of Na/L in a 1:2 SW:[id number] ratio) ? Lipids at 68mls/hr x 12 hours 3 times weekly on MWF  TPN to contain standard multivitamins and trace elements daily  Maintenance IVF NS at 10 ml/hr  CBGs and moderate SSI q6h.  TPN lab panels on Mondays & Thursdays.  Check BMET, Mag & Phos daily    Royetta Asal, PharmD, BCPS Pager [phone] 06/18/2017 11:20 AM

## 2017-06-18 NOTE — Care Management Note (Signed)
Case Management Note  Patient Details  Name: [name] [name] MRN: [id number] Date of Birth: 07-21-1961  Subjective/Objective:   NG tube clamped, sips of cl. Ligs. IV TNA                 Action/Plan: Date:  June 18, 2017  Chart reviewed for concurrent status and case management needs.  Will continue to follow patient progress.  Discharge Planning: following for needs  Expected discharge date: [id number]  [name], BSN, [place], [place]   Expected Discharge Date:                  Expected Discharge Plan:  Home/Self Care  In-House Referral:     Discharge planning Services  CM Consult  Post Acute Care Choice:    Choice offered to:     DME Arranged:    DME Agency:     HH Arranged:    HH Agency:     Status of Service:  In process, will continue to follow  If discussed at Long Length of Stay Meetings, dates discussed:    Additional Comments:  Leeroy Cha, RN 06/18/2017, 9:27 AM

## 2017-06-18 NOTE — Progress Notes (Signed)
[facility] Consultation Progress Note  Patient: [name] [name] YSA:[id number]   PCP: [name] Amel, MD DOB: 1961-01-10   DOA: 05/06/2017   DOS: 06/18/2017   Date of Service: the patient was seen and examined on 06/18/2017 Primary service: [name], Arta Bruce, MD   Subjective: complains about more abdominal pain. Cough is actually making the pain better as well as make the headache worse. No vomiting or nausea.  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 Patient's course was complicated by small bowel injury requiring resection with anastomoses and placement of wound VAC. On 7/18, patient developed leak and was taken back to the operating room for redo of his exploratory laparotomy and repair of leak. That time, patient returned to the ICU on the ventilator. Critical care was consulted for ventilator/medical management.  07/19 - NGT pulled out and almost self-extubated. Developed cuff leak >> tube exchanged. Back to OR emergently for intestinal leak/ischemic bowel.TPN started at that time 07/22 - Back to OR for wash out >> left with open abdomen & wound vac in place. Transfused 1u PRBC. 07/25 - rising temp, WBC increased. Returned to OR for closure 07/27 - Recultured, sedation down on precedex [facility] 7/27 >> MRSE 1/2 > contaminant ? 07/30 - Remains on sedation, VAC leak over the weekend with dressing change by [facility] 07/31 - Initial attempt at weaning failed to due to tachypnea/anxiety 08/02 - Fever 101.1, WBC 14.2, weaned / extubated 08/03 - O2 weaned to 1L. Remains on dilaudid, initially on gtt now PCA. RN reports precedex started overnight for anxiety. 08/04 - Ultrasound-guided aspiration  By interventional radiology of small perihepatic fluid collection yielded only 1 mL of the fluid, sent for culture. No growth yet 08/06 - patient improving. Off Precedex drip since 8/4. Still on TPN 08/08 - wound vac changed per surgery. WBC 16. Abx d/c per surgery  08/13 - Patient with increased tachycardia and developed a fever.  Wound VAC noted with feculent material.  Started on IV Zosyn 08/14 - CT abdomen pelvis shows extraluminal stool in the right lower quadrant, surgery expects conservative management.  Assessment and Plan: 1. Sepsis- resolved now -due to peritonitis/abd abscess and MSSA HCAP.   -white count has been overall stable,, and antibiotics were discontinued on 8/8, then Zosyn had to be restarted on 8/13 due to concern for recurrent sepsis from peritonitis given fever and elevated heart rate, completed on 8/21 -monitor now -always at risk of sepsis with ongoing leak and fluid overload from TNA  2. Peritonitis/intestinal perforation/anastomotic leak - 05/29/2017 CT abdomen and pelvis thick walled fluid collection along anterior and lateral aspects of the liver; extends 20 cm along anterior and lateral margins of liver.   Interventional radiology was consulted and on 05/30/2017 he underwent aspiration.   -Cultures have remained negative. 05/20/17--ABDOMINAL WOUND [name] OUT, WOUND CLOSURE AND WOUND VAC PLACEMENT.  NG remains in place, patient will need to be on TPN for quite some time per general surgery -per [facility] -last CT 8/14, getting Vac changes MWF -Continue low-dose by mouth Lasix due to TNA  3. Acute respiratory failure with hypoxia  -resolved, secondary to sepsis and HCAP post op adrenalectomy.  -stable now - He was intubated and then extubated  since 05/28/2017.  Continue DuoNeb's. - Repeat chest x-ray shows no pneumonia, pleural effusion or pneumothorax.  -overall volume overloaded from all the fluid in in TNA, second dose of Lasix today, and then dose  intermittently  4. Sinus tachycardia - ongoing, likely related to peritonitis - 2D echo obtained 8/13 showed an EF of 55-60% and grade 1 diastolic dysfunction  5. Acute postoperative blood loss anemia. -due to acute illness and dilution -There was some concerns for coffee-ground return in his NG tube therefore he is on SCDs and his Lovenox was on hold, continue PPI, now stable resume Lovenox   6. Hyponatremia, hypomagnesemia, hypokalemia, hyperphosphatemia -Suspect due to TPN, -TSH normal - Daily monitoring and correction  7 MSSA HCAP  finished 7 days of cefepime, afebrile, stable respiratory status Continue flutter device as well as incentive spirometer, OOB  8. R-adrenal mass/pheochromocytoma  -s/p R-adrenalectomy, surgery was complicated by small bowel injury in the setting of adhesions with sepsis secondary to intra-abdominal infection  9. Acute metabolic encephalopathy -multifactorial including sepsis, infection, electrolyte derangement, opioids/benzos.   -Stable  10 Thrombocytosis -likely acute phase reactant   11. Essential Hypertension -remains stable off home clonidine, labetalol, chlorthalidone, valsartan  12. Chronic pain syndrome/Post-op pain -had significant post-op pain -remains on dilaudid PCA, po oxycodone scheduled, ativan scheduled -pain management per [facility] -previously on home gaba, percocet  13. Hyperlipidemia -remain off lipitor until able to tolerate po reliably  14 Tobacco abuse -nicoderm patch  15 Hyperglycemia -likely stress induced and TPN -A1C was 5.6, not a diabetic -continue novolog sliding scale  Diet:advance to clear liquid diet per [facility] DVT Prophylaxis: Restart Lovenox  Family Communication: wife at bedside Disposition: to be determined  Other Consultants: Wound care, critical care Procedures: Echocardiogram, rest per progress note  Antibiotics: Anti-infectives    Start     Dose/Rate Route Frequency Ordered Stop    06/16/17 1200  piperacillin-tazobactam (ZOSYN) IVPB 3.375 g     3.375 g 12.5 mL/hr over 240 Minutes Intravenous Every 8 hours 06/16/17 1026 06/17/17 0004   06/08/17 1200  piperacillin-tazobactam (ZOSYN) IVPB 3.375 g  Status:  Discontinued     3.375 g 12.5 mL/hr over 240 Minutes Intravenous Every 8 hours 06/08/17 1056 06/16/17 1026   05/29/17 2200  ceFEPIme (MAXIPIME) 2 g in dextrose 5 % 50 mL IVPB  Status:  Discontinued     2 g 100 mL/hr over 30 Minutes Intravenous Every 8 hours 05/29/17 2031 06/03/17 0832   05/29/17 2100  metroNIDAZOLE (FLAGYL) IVPB 500 mg  Status:  Discontinued     500 mg 100 mL/hr over 60 Minutes Intravenous Every 8 hours 05/29/17 2029 06/03/17 0832   05/26/17 2200  ceFAZolin (ANCEF) IVPB 1 g/50 mL premix  Status:  Discontinued     1 g 100 mL/hr over 30 Minutes Intravenous Every 8 hours 05/26/17 1008 05/29/17 2023   05/25/17 1100  vancomycin (VANCOCIN) 1,250 mg in sodium chloride 0.9 % 250 mL IVPB  Status:  Discontinued     1,250 mg 166.7 mL/hr over 90 Minutes Intravenous Every 12 hours 05/25/17 0955 05/26/17 0946   05/22/17 1400  anidulafungin (ERAXIS) 100 mg in sodium chloride 0.9 % 100 mL IVPB     100 mg 78 mL/hr over 100 Minutes Intravenous Every 24 hours 05/21/17 1330 05/27/17 1740   05/21/17 1400  anidulafungin (ERAXIS) 200 mg in sodium chloride 0.9 % 200 mL IVPB     200 mg 78 mL/hr over 200 Minutes Intravenous  Once 05/21/17 1330 05/21/17 1940  05/15/17 1000  anidulafungin (ERAXIS) 100 mg in sodium chloride 0.9 % 100 mL IVPB  Status:  Discontinued     100 mg 78 mL/hr over 100 Minutes Intravenous Every 24 hours 05/14/17 0829 05/16/17 0901   05/15/17 1000  metroNIDAZOLE (FLAGYL) IVPB 500 mg  Status:  Discontinued     500 mg 100 mL/hr over 60 Minutes Intravenous Every 8 hours 05/15/17 0903 05/25/17 0938   05/14/17 0900  anidulafungin (ERAXIS) 200 mg in sodium chloride 0.9 % 200 mL IVPB     200 mg 78 mL/hr over 200 Minutes Intravenous  Once 05/14/17 0829  05/14/17 1325   05/13/17 1927  vancomycin (VANCOCIN) 1-5 GM/200ML-% IVPB    Comments:  Ward, Christa   : cabinet override      05/13/17 1927 05/14/17 0744   05/12/17 1400  ceFEPIme (MAXIPIME) 1 g in dextrose 5 % 50 mL IVPB     1 g 100 mL/hr over 30 Minutes Intravenous Every 8 hours 05/12/17 0939 05/26/17 2359   05/12/17 0900  vancomycin (VANCOCIN) IVPB 1000 mg/200 mL premix  Status:  Discontinued     1,000 mg 200 mL/hr over 60 Minutes Intravenous Every 12 hours 05/12/17 0750 05/16/17 0852   05/12/17 0830  aztreonam (AZACTAM) 2 GM IVPB     2 g 100 mL/hr over 30 Minutes Intravenous  Once 05/12/17 0800 05/12/17 0957   05/06/17 0916  vancomycin (VANCOCIN) IVPB 1000 mg/200 mL premix     1,000 mg 200 mL/hr over 60 Minutes Intravenous On call to O.R. 05/06/17 0916 05/06/17 1229      Objective: Physical Exam: Vitals:   06/18/17 0800 06/18/17 0916 06/18/17 1124 06/18/17 1200  BP:      Pulse: 60     Resp: (!) 23  16   Temp: 98 F (36.7 C)   97.9 F (36.6 C)  TempSrc: Oral   Oral  SpO2: 96% 100% 100%   Weight:      Height:        Intake/Output Summary (Last 24 hours) at 06/18/17 1322 Last data filed at 06/18/17 0600  Gross per 24 hour  Intake           2080.5 ml  Output             1705 ml  Net            375.5 ml   Filed Weights   06/14/17 0500 06/15/17 0403 06/17/17 0356  Weight: 81.4 kg (179 lb 7.3 oz) 81.9 kg (180 lb 8.9 oz) 83.6 kg (184 lb 4.9 oz)    Gen: Chronically ill-appearing African-American male, resting in bed, no distress HEENT: no JVD Lungs: Decreased breath sounds at the bases  CVS: RRR,No Gallops,Rubs or new Murmurs Abd: soft, mild diffuse tenderness, large abdominal wound with wound VAC and 2 drainage catheters  Extremities: No Cyanosis, Clubbing or edema Skin: no new rashes Psychiatric : Flat affect    Data Reviewed: CBC:  Recent Labs Lab 06/12/17 0607 06/13/17 0430 06/14/17 0400 06/15/17 0809 06/16/17 0428  WBC 11.1* 10.2 10.9* 11.0* 11.3*   NEUTROABS  --   --   --  5.1  --   HGB 8.8* 8.2* 8.6* 8.8* 9.0*  HCT 27.4* 25.5* 26.7* 26.8* 28.0*  MCV 79.9 85.3 80.4 78.6 79.5  PLT 453* 430* 433* 452* 784*   Basic Metabolic Panel:  Recent Labs Lab 06/14/17 0400 06/15/17 0809 06/16/17 0428 06/17/17 0450 06/18/17 0407  NA 137 135 135 130* 133*  K  3.8 4.2 4.5 4.6 4.1  CL 106 103 103 100* 99*  CO2 25 25 24 23 26   GLUCOSE 108* 94 91 125* 115*  BUN 23* 21* 21* 23* 27*  CREATININE 0.63 0.70 0.74 0.76 0.77  CALCIUM 8.8* 8.9 9.2 9.2 9.1  MG 1.6* 1.8 1.7 1.5* 1.8  PHOS 4.8* 5.0* 5.6* 4.7* 5.3*   Liver Function Tests:  Recent Labs Lab 06/12/17 0607 06/14/17 0400 06/15/17 0809 06/18/17 0407  AST 23 21 20 25   ALT 18 18 19 24   ALKPHOS 145* 123 139* 192*  BILITOT 0.8 1.1 1.2 0.9  PROT 6.8 6.6 6.9 7.4  ALBUMIN 2.4* 2.3* 2.2* 2.7*   No results for input(s): LIPASE, AMYLASE in the last 168 hours. No results for input(s): AMMONIA in the last 168 hours. Cardiac Enzymes: No results for input(s): CKTOTAL, CKMB, CKMBINDEX, TROPONINI in the last 168 hours. BNP (last 3 results) No results for input(s): BNP in the last 8760 hours. CBG:  Recent Labs Lab 06/17/17 1219 06/17/17 1721 06/17/17 2304 06/18/17 0623 06/18/17 1211  GLUCAP 125* 126* 132* 119* 119*   Recent Results (from the past 240 hour(s))  MRSA PCR Screening     Status: None   Collection Time: 06/11/17  5:59 PM  Result Value Ref Range Status   MRSA by PCR NEGATIVE NEGATIVE Final    Comment:        The GeneXpert MRSA Assay (FDA approved for NASAL specimens only), is one component of a comprehensive MRSA colonization surveillance program. It is not intended to diagnose MRSA infection nor to guide or monitor treatment for MRSA infections.     Studies: No results found.   Scheduled Meds: . chlorhexidine  15 mL Mouth Rinse BID  . Chlorhexidine Gluconate Cloth  6 each Topical Q1200  . chlorpheniramine-HYDROcodone  5 mL Oral Q12H  . feeding supplement   1 Container Oral BID BM  . HYDROmorphone   Intravenous Q4H  . insulin aspart  0-15 Units Subcutaneous Q6H  . ipratropium-albuterol  3 mL Nebulization BID  . lip balm  1 application Topical BID  . LORazepam  2 mg Oral Q6H  . mouth rinse  15 mL Mouth Rinse q12n4p  . nicotine  21 mg Transdermal Daily  . nystatin  5 mL Oral TID  . oxyCODONE  10 mg Per Tube Q4H  . pantoprazole (PROTONIX) IV  40 mg Intravenous Q12H  . saccharomyces boulardii  250 mg Oral BID  . thiamine injection  100 mg Intravenous Daily   Continuous Infusions: . sodium chloride 10 mL/hr at 06/18/17 0600  . chlorproMAZINE (THORAZINE) IV 12.5 mg (06/17/17 0830)  . famotidine (PEPCID) IV Stopped (06/18/17 0944)  . TPN (CLINIMIX) Adult without lytes 115 mL/hr at 06/18/17 0600  . TPN (CLINIMIX) Adult without lytes     PRN Meds: acetaminophen, butalbital-acetaminophen-caffeine, chlorproMAZINE (THORAZINE) IV, diphenhydrAMINE, HYDROmorphone, iopamidol, labetalol, LORazepam, magic mouthwash, menthol-cetylpyridinium, naloxone **AND** sodium chloride flush, ondansetron **OR** ondansetron (ZOFRAN) IV, phenol, sodium chloride flush  Time spent: 35 minutes  Author: Domenic Polite, MD [facility] Page via [email or web address], password [facility]  06/18/2017 1:22 PM  If 7PM-7AM, please contact night-coverage at [email or web address], password [facility]

## 2017-06-18 NOTE — Progress Notes (Signed)
Physical Therapy Treatment Patient Details Name: [name] [name] MRN: [id number] DOB: Sep 06, 1961 Today's Date: 06/18/2017    History of Present Illness  56 y.o. 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 On 7/18, he developed a leak therefore was taken back to OR for re-do of ex-lap and repair of leak on 05/20/17.  He returned to the ICU on the vent . Extubated on 05/28/17.    PT Comments    Pt tolerated session well. Pt able and willing to attempt ambulation. Pt ambulated 60 ft in hall before HR elevated to 139 BPM. RN was made aware of HR. Pt reports increased pain in abdomen while sitting in the chair and requested to be back in the bed following ambulation.    Follow Up Recommendations  SNF;Supervision/Assistance - 24 hour     Equipment Recommendations  Rolling walker with 5" wheels    Recommendations for Other Services       Precautions / Restrictions Precautions Precautions: Fall Precaution Comments: multiple lines in abdomen, VAC, NG, and JP drain    Mobility  Bed Mobility Overal bed mobility: Needs Assistance Bed Mobility: Supine to Sit;Sit to Supine     Supine to sit: +2 for safety/equipment;Min assist;Min guard Sit to supine: Min assist;+2 for safety/equipment;Min guard   General bed mobility comments: Pt requires min guard for safety, pt able to manage trunk and LE during bed mobility but requires A to manage multiple lines and drains   Transfers Overall transfer level: Needs assistance Equipment used: Rolling walker (2 wheeled) Transfers: Sit to/from Stand Sit to Stand: Min assist;From elevated surface;+2 physical assistance;+2 safety/equipment Stand pivot transfers: Min assist;+2 physical assistance       General transfer comment: Min A to rise and steady, pt performed both  sit to stand and stand pivot transfer   Ambulation/Gait Ambulation/Gait assistance: +2 physical assistance;+2 safety/equipment;Min assist Ambulation Distance (Feet): 60 Feet Assistive device: Rolling walker (2 wheeled) Gait Pattern/deviations: Step-through pattern;Decreased stride length;Narrow base of support Gait velocity: decr   General Gait Details: Pt ambulated 60 ft with +2 assist for safety, pt HR elevated to 139 BPM, RN aware of HR, HR was 115-120 at rest    Stairs            Wheelchair Mobility    Modified Rankin (Stroke Patients Only)       Balance                                            Cognition Arousal/Alertness: Awake/alert Behavior During Therapy: WFL for tasks assessed/performed Overall Cognitive Status: Within Functional Limits for tasks assessed                                        Exercises      General Comments        Pertinent Vitals/Pain Pain Assessment: Faces Faces Pain Scale: Hurts whole lot Pain Location: R side abdomen  Pain Descriptors / Indicators: Grimacing;Sore Pain Intervention(s): Limited activity within patient's tolerance;Monitored during session;Premedicated before session;PCA encouraged;Repositioned;RN gave pain meds during session    Home Living  Prior Function            PT Goals (current goals can now be found in the care plan section) Progress towards PT goals: Progressing toward goals    Frequency    Min 3X/week      PT Plan Current plan remains appropriate    Co-evaluation              AM-PAC PT "6 Clicks" Daily Activity  Outcome Measure  Difficulty turning over in bed (including adjusting bedclothes, sheets and blankets)?: A Little Difficulty moving from lying on back to sitting on the side of the bed? : A Lot Difficulty sitting down on and standing up from a chair with arms (e.g., wheelchair, bedside commode, etc,.)?:  Unable Help needed moving to and from a bed to chair (including a wheelchair)?: A Lot Help needed walking in hospital room?: A Lot Help needed climbing 3-5 steps with a railing? : Total 6 Click Score: 11    End of Session Equipment Utilized During Treatment: Gait belt Activity Tolerance: Patient tolerated treatment well Patient left: in bed;with nursing/sitter in room;with family/visitor present;with call bell/phone within reach Nurse Communication: Mobility status PT Visit Diagnosis: Difficulty in walking, not elsewhere classified (R26.2);Muscle weakness (generalized) (M62.81)     Time: 4287-6811 PT Time Calculation (min) (ACUTE ONLY): 42 min  Charges:  $Gait Training: 8-22 mins $Therapeutic Activity: 8-22 mins                    G Codes:       Olegario Shearer, SPT    Reino Bellis 06/18/2017, 4:51 PM

## 2017-06-19 LAB — GLUCOSE, CAPILLARY
GLUCOSE-CAPILLARY: 118 mg/dL — AB (ref 65–99)
GLUCOSE-CAPILLARY: 127 mg/dL — AB (ref 65–99)
Glucose-Capillary: 130 mg/dL — ABNORMAL HIGH (ref 65–99)

## 2017-06-19 LAB — BASIC METABOLIC PANEL
Anion gap: 8 (ref 5–15)
BUN: 27 mg/dL — AB (ref 6–20)
CALCIUM: 9 mg/dL (ref 8.9–10.3)
CO2: 26 mmol/L (ref 22–32)
CREATININE: 0.74 mg/dL (ref 0.61–1.24)
Chloride: 98 mmol/L — ABNORMAL LOW (ref 101–111)
GFR calc Af Amer: >60 mL/min (ref >60)
GLUCOSE: 117 mg/dL — AB (ref 65–99)
Potassium: 3.8 mmol/L (ref 3.5–5.1)
Sodium: 132 mmol/L — ABNORMAL LOW (ref 135–145)

## 2017-06-19 LAB — CBC
HEMATOCRIT: 30 % — AB (ref 39.0–52.0)
Hemoglobin: 9.8 g/dL — ABNORMAL LOW (ref 13.0–17.0)
MCH: 25.8 pg — AB (ref 26.0–34.0)
MCHC: 32.7 g/dL (ref 30.0–36.0)
MCV: 78.9 fL (ref 78.0–100.0)
Platelets: 509 10*3/uL — ABNORMAL HIGH (ref 150–400)
RBC: 3.8 MIL/uL — ABNORMAL LOW (ref 4.22–5.81)
RDW: 16.5 % — AB (ref 11.5–15.5)
WBC: 13.1 10*3/uL — ABNORMAL HIGH (ref 4.0–10.5)

## 2017-06-19 LAB — PHOSPHORUS: Phosphorus: 4.8 mg/dL — ABNORMAL HIGH (ref 2.5–4.6)

## 2017-06-19 LAB — MAGNESIUM: Magnesium: 1.5 mg/dL — ABNORMAL LOW (ref 1.7–2.4)

## 2017-06-19 MED ORDER — METHOCARBAMOL 1000 MG/10ML IJ SOLN
1000.0000 mg | Freq: Three times a day (TID) | INTRAVENOUS | Status: DC
  Administered 2017-06-19 – 2017-06-28 (×27): 1000 mg via INTRAVENOUS
  Filled 2017-06-19 (×6): qty 10
  Filled 2017-06-19: qty 600
  Filled 2017-06-19 (×14): qty 10
  Filled 2017-06-19: qty 600
  Filled 2017-06-19 (×6): qty 10

## 2017-06-19 MED ORDER — FAT EMULSION 20 % IV EMUL
240.0000 mL | INTRAVENOUS | Status: AC
  Administered 2017-06-19: 240 mL via INTRAVENOUS
  Filled 2017-06-19: qty 250

## 2017-06-19 MED ORDER — MAGNESIUM SULFATE 4 GM/100ML IV SOLN
4.0000 g | Freq: Once | INTRAVENOUS | Status: AC
  Administered 2017-06-19: 4 g via INTRAVENOUS
  Filled 2017-06-19: qty 100

## 2017-06-19 MED ORDER — FUROSEMIDE 10 MG/ML IJ SOLN
20.0000 mg | INTRAMUSCULAR | Status: DC
  Administered 2017-06-19 – 2017-06-21 (×2): 20 mg via INTRAVENOUS
  Filled 2017-06-19 (×3): qty 2

## 2017-06-19 MED ORDER — POTASSIUM CHLORIDE 10 MEQ/100ML IV SOLN
10.0000 meq | INTRAVENOUS | Status: AC
  Administered 2017-06-19 (×2): 10 meq via INTRAVENOUS
  Filled 2017-06-19 (×2): qty 100

## 2017-06-19 MED ORDER — METOCLOPRAMIDE HCL 5 MG/ML IJ SOLN
5.0000 mg | Freq: Three times a day (TID) | INTRAMUSCULAR | Status: DC
  Administered 2017-06-19 – 2017-06-25 (×21): 5 mg via INTRAVENOUS
  Filled 2017-06-19 (×21): qty 2

## 2017-06-19 MED ORDER — SODIUM ACETATE 2 MEQ/ML IV SOLN
INTRAVENOUS | Status: AC
Start: 2017-06-19 — End: 2017-06-20
  Administered 2017-06-19: 17:00:00 via INTRAVENOUS
  Filled 2017-06-19: qty 2760
  Filled 2017-06-19: qty 2000

## 2017-06-19 NOTE — Progress Notes (Signed)
Physical Therapy Treatment Patient Details Name: [name] [name] MRN: [id number] DOB: [birth year] Today's Date: 06/19/2017    History of Present Illness  56 y.o. 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 On 7/18, he developed a leak therefore was taken back to OR for re-do of ex-lap and repair of leak on 05/20/17.  He returned to the ICU on the vent . Extubated on 05/28/17.     PT Comments    Pt tolerated session well. Pt ambulated 60 ft before requesting recliner. Pt reports increased abdominal pain after ambulation but willing to sit upright in chair. Pt demonstrated improved bed and transfer mobility today requiring less assistance. Pt remains +2 for lines, leads, and tube management.    Follow Up Recommendations  SNF;Supervision/Assistance - 24 hour     Equipment Recommendations  Rolling walker with 5" wheels    Recommendations for Other Services       Precautions / Restrictions Precautions Precautions: Fall Precaution Comments: multiple lines in abdomen, VAC, NG, and JP drain Restrictions Weight Bearing Restrictions: No    Mobility  Bed Mobility Overal bed mobility: Needs Assistance Bed Mobility: Supine to Sit     Supine to sit: +2 for safety/equipment;Min guard     General bed mobility comments: Pt requires min guard for safety and managing lines, pt able to pull himself upright at EOB with unilateral UE support   Transfers Overall transfer level: Needs assistance Equipment used: Rolling walker (2 wheeled) Transfers: Sit to/from Stand Sit to Stand: Min guard;+2 safety/equipment         General transfer comment: Pt able to stand and steady, min guard for safety and management of lines   Ambulation/Gait Ambulation/Gait assistance: Min guard;+2 physical assistance;+2  safety/equipment Ambulation Distance (Feet): 60 Feet Assistive device: Rolling walker (2 wheeled) Gait Pattern/deviations: Step-through pattern;Decreased stride length;Narrow base of support Gait velocity: decr   General Gait Details: Pt ambulated 60 ft with +2 assist for safety and to manage 2 IV poles and recliner, pt HR elevated to 144 BPM, HR was 115-120 at rest    Stairs            Wheelchair Mobility    Modified Rankin (Stroke Patients Only)       Balance                                            Cognition Arousal/Alertness: Awake/alert Behavior During Therapy: WFL for tasks assessed/performed Overall Cognitive Status: Within Functional Limits for tasks assessed                                 General Comments: pleasant/cooperative      Exercises      General Comments        Pertinent Vitals/Pain Pain Assessment: Faces Faces Pain Scale: Hurts whole lot Pain Location: R side abdomen  Pain Descriptors / Indicators: Sore;Grimacing Pain Intervention(s): Limited activity within patient's tolerance;Monitored during session;Premedicated before session;PCA encouraged;Repositioned    Home Living Family/patient expects to be discharged to:: Skilled nursing facility Living Arrangements: Spouse/significant other Available Help at Discharge: Family;Available 24 hours/day  Prior Function Level of Independence: Independent          PT Goals (current goals can now be found in the care plan section) Progress towards PT goals: Progressing toward goals    Frequency    Min 3X/week      PT Plan Current plan remains appropriate    Co-evaluation              AM-PAC PT "6 Clicks" Daily Activity  Outcome Measure  Difficulty turning over in bed (including adjusting bedclothes, sheets and blankets)?: A Little Difficulty moving from lying on back to sitting on the side of the bed? : A Little Difficulty  sitting down on and standing up from a chair with arms (e.g., wheelchair, bedside commode, etc,.)?: A Little Help needed moving to and from a bed to chair (including a wheelchair)?: A Little Help needed walking in hospital room?: A Little Help needed climbing 3-5 steps with a railing? : Total 6 Click Score: 16    End of Session Equipment Utilized During Treatment: Gait belt Activity Tolerance: Patient tolerated treatment well Patient left: in chair;with call bell/phone within reach Nurse Communication: Mobility status PT Visit Diagnosis: Difficulty in walking, not elsewhere classified (R26.2);Muscle weakness (generalized) (M62.81)     Time: 9675-9163 PT Time Calculation (min) (ACUTE ONLY): 27 min  Charges:  $Gait Training: 8-22 mins $Therapeutic Activity: 8-22 mins                    G Codes:       Olegario Shearer, SPT    Reino Bellis 06/19/2017, 1:10 PM

## 2017-06-19 NOTE — Progress Notes (Addendum)
[facility] Progress Note  30 Days Post-Op  Subjective: CC: Abdominal pain  Pain in right abdomen is unchanged from yesterday. Had NGT clamped yesterday and took some clears, but had some nausea with this. Pain worse after working with PT yesterday.   Objective: Vital signs in last 24 hours: Temp:  [97.6 F (36.4 C)-98.4 F (36.9 C)] 97.6 F (36.4 C) (08/24 0335) Pulse Rate:  [94-120] 106 (08/24 0800) Resp:  [16-27] 23 (08/24 0800) BP: (111-155)/(73-96) 120/87 (08/24 0800) SpO2:  [96 %-100 %] 98 % (08/24 0800) Last BM Date: 06/07/17  Intake/Output from previous day: 08/23 0701 - 08/24 0700 In: 3275.9 [P.O.:100; I.V.:2880.9; NG/GT:195; IV Piggyback:100] Out: 2251 [Urine:1055; Emesis/NG output:600; Drains:596] Intake/Output this shift: No intake/output data recorded.  PE: Gen:  Alert, NAD Head: NGT present Pulm:  Normal effort Abd:  two foleys in place and draining; RLQ JP with small amount of drainage; RUQ wound as below with moderate slough present at inferior border of wound, minimal slough present at superior border   Skin: warm and dry, no rashes  Psych: A&Ox3   Lab Results:   Recent Labs  06/19/17 0316  WBC 13.1*  HGB 9.8*  HCT 30.0*  PLT 509*   BMET  Recent Labs  06/18/17 0407 06/19/17 0316  NA 133* 132*  K 4.1 3.8  CL 99* 98*  CO2 26 26  GLUCOSE 115* 117*  BUN 27* 27*  CREATININE 0.77 0.74  CALCIUM 9.1 9.0   PT/INR No results for input(s): LABPROT, INR in the last 72 hours. CMP     Component Value Date/Time   NA 132 (L) 06/19/2017 0316   K 3.8 06/19/2017 0316   CL 98 (L) 06/19/2017 0316   CO2 26 06/19/2017 0316   GLUCOSE 117 (H) 06/19/2017 0316   BUN 27 (H) 06/19/2017 0316   CREATININE 0.74 06/19/2017 0316   CREATININE 0.85 07/22/2013 1021   CALCIUM 9.0 06/19/2017 0316   PROT 7.4 06/18/2017 0407   ALBUMIN 2.7 (L) 06/18/2017 0407   AST 25 06/18/2017 0407   ALT 24 06/18/2017 0407   ALKPHOS 192 (H) 06/18/2017 0407   BILITOT 0.9 06/18/2017 0407   GFRNONAA >60 06/19/2017 0316   GFRAA >60 06/19/2017 0316   Lipase     Component Value Date/Time   LIPASE 27 09/15/2015 0934       Studies/Results: No results found.  Anti-infectives: Anti-infectives    Start     Dose/Rate Route Frequency Ordered Stop   06/16/17 1200  piperacillin-tazobactam (ZOSYN) IVPB 3.375 g     3.375 g 12.5 mL/hr over 240 Minutes Intravenous Every 8 hours 06/16/17 1026 06/17/17 0004   06/08/17 1200  piperacillin-tazobactam (ZOSYN) IVPB 3.375 g  Status:  Discontinued     3.375 g 12.5 mL/hr over 240 Minutes Intravenous Every 8 hours 06/08/17 1056 06/16/17 1026   05/29/17 2200  ceFEPIme (MAXIPIME) 2 g in dextrose 5 % 50 mL IVPB  Status:  Discontinued     2 g 100 mL/hr over 30 Minutes Intravenous Every 8 hours 05/29/17 2031 06/03/17 0832   05/29/17 2100  metroNIDAZOLE (FLAGYL) IVPB 500 mg  Status:  Discontinued     500 mg 100 mL/hr over 60 Minutes Intravenous Every 8 hours 05/29/17 2029 06/03/17 0832   05/26/17 2200  ceFAZolin (ANCEF) IVPB 1 g/50 mL premix  Status:  Discontinued     1 g 100 mL/hr over 30 Minutes Intravenous Every 8 hours 05/26/17 1008 05/29/17 2023   05/25/17 1100  vancomycin (  VANCOCIN) 1,250 mg in sodium chloride 0.9 % 250 mL IVPB  Status:  Discontinued     1,250 mg 166.7 mL/hr over 90 Minutes Intravenous Every 12 hours 05/25/17 0955 05/26/17 0946   05/22/17 1400  anidulafungin (ERAXIS) 100 mg in sodium chloride 0.9 % 100 mL IVPB     100 mg 78 mL/hr over 100 Minutes Intravenous Every 24 hours 05/21/17 1330 05/27/17 1740   05/21/17 1400  anidulafungin (ERAXIS) 200 mg in sodium chloride 0.9 % 200 mL IVPB     200 mg 78 mL/hr over 200 Minutes Intravenous  Once 05/21/17 1330 05/21/17 1940   05/15/17 1000  anidulafungin (ERAXIS) 100 mg in sodium chloride 0.9 % 100 mL IVPB  Status:  Discontinued     100 mg 78 mL/hr over 100 Minutes Intravenous Every 24 hours 05/14/17 0829 05/16/17 0901   05/15/17 1000   metroNIDAZOLE (FLAGYL) IVPB 500 mg  Status:  Discontinued     500 mg 100 mL/hr over 60 Minutes Intravenous Every 8 hours 05/15/17 0903 05/25/17 0938   05/14/17 0900  anidulafungin (ERAXIS) 200 mg in sodium chloride 0.9 % 200 mL IVPB     200 mg 78 mL/hr over 200 Minutes Intravenous  Once 05/14/17 0829 05/14/17 1325   05/13/17 1927  vancomycin (VANCOCIN) 1-5 GM/200ML-% IVPB    Comments:  Ward, Christa   : cabinet override      05/13/17 1927 05/14/17 0744   05/12/17 1400  ceFEPIme (MAXIPIME) 1 g in dextrose 5 % 50 mL IVPB     1 g 100 mL/hr over 30 Minutes Intravenous Every 8 hours 05/12/17 0939 05/26/17 2359   05/12/17 0900  vancomycin (VANCOCIN) IVPB 1000 mg/200 mL premix  Status:  Discontinued     1,000 mg 200 mL/hr over 60 Minutes Intravenous Every 12 hours 05/12/17 0750 05/16/17 0852   05/12/17 0830  aztreonam (AZACTAM) 2 GM IVPB     2 g 100 mL/hr over 30 Minutes Intravenous  Once 05/12/17 0800 05/12/17 0957   05/06/17 0916  vancomycin (VANCOCIN) IVPB 1000 mg/200 mL premix     1,000 mg 200 mL/hr over 60 Minutes Intravenous On call to O.R. 05/06/17 0916 05/06/17 1229       Assessment/Plan On chronic pain meds - Takes about 40 mg oxycodone per day for neck - goes to pain clinic - sees Dr. [name] for neck HTN COPD/Smokes  OPEN RIGHT ADRENALECTOMY WITH SMALL BOWEL RESECTION AND ANASTOMOSIS - 05/06/2017 - [name] - Additional surgery - 05/13/2017, 05/14/2017, 05/17/2017 and 05/20/2017 - Currently with distal ileum in discontinuity and catheter drainage of the 2 bowel ends. Now has new apparent enteric leak from a separate anastomosis communicating to the anterior abdomen and the VAC dressing.  - Last CT scan on 08/14 - WBC 13.1, afebrile, sinus tachycardia - continue VACM, W, F Malnutrition - On TPN  - allow sips of elemental fluids with NGT clamped Anemia - Hgb - 9.8- 06/19/2017  FEN: IVF, TPN, NGT, elemental liquids VTE: SCD's, lovenox ID: Zosyn- 06/07/2017  >06/16/17; florastor (8/22>>) Foley: none Follow up: TBD  DISPO: Continue current management. Encourage patient to get up and out of the bed.   Will discuss with MD if there may be any benefit for some reglan for nausea or scheduled robaxin for pain.   LOS: 44 days    Brigid Re , [facility] Surgery 06/19/2017, 8:44 AM Pager: [phone] Consults: [phone]  Agree with above. Seen earlier by Dr. Hassell Done. Wife at bedside.  I answered  some questions. Main issue is pain in RUQ at [facility] site and some nausea, which is persistent. Otherwise stable.  Alphonsa Overall, MD, [facility] Surgery Pager: [phone] Office phone:  [phone]

## 2017-06-19 NOTE — Progress Notes (Signed)
Pt seen, no significant changes from prior, apart from ongoing intra abd issues, continues to have net IVF/TNA intake of 2-3L per day and hence I will leave him on lasix every other day to prevent iatrogenic fluid overload. -rest per CCS -will follow peripherally  Domenic Polite, MD

## 2017-06-19 NOTE — Progress Notes (Addendum)
[place] NOTE   Pharmacy Consult for TPN Indication: prolonged ileus, small bowel leak, multiple bowel surgeries  Patient Measurements: Body mass index is 26.44 kg/m. Filed Weights   06/14/17 0500 06/15/17 0403 06/17/17 0356  Weight: 179 lb 7.3 oz (81.4 kg) 180 lb 8.9 oz (81.9 kg) 184 lb 4.9 oz (83.6 kg)   HPI: 71 yoM admitted on 7/11 for enlarging adrenal mass and planned adrenalectomy.  Pharmacy consulted to dose TPN.  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   7/22 OR:  wash out, tube ileostomy, left with open abdomen & wound vac in place.  Plan for OR on 7/25 for closure 7/25 OR: mesh placed for abdominal closure, wound vac 8/2 extubated, propofol drip off 8/4 TPN off for ~ 5 hours due to line detached from filter 8/10 MD request decrease rate x 1 week 2nd low sodium 8/12 Additional sodium added to TPN due to persistent hyponatremia 8/16 Increasing rate back to goal since sodium has improved with above intervention.   8/20 Prealbumin decreased (possibly related to TPN rate below goal from 8/10 - 8/16?).  New apparent enteric leak per surgery notes. 8/21 CaPhos product elevated, remove electrolytes from TPN  Insulin requirements past 24 hours: 6 units SSI (no hx DM)  Current Nutrition: Clear Liquid Diet (8/21)  IVF: NS at 10 ml/hr  Central access: PICC line ordered 7/19, placed on 7/20 TPN start date: 7/20  ASSESSMENT                                                                                                           Today, 06/19/17  - Glucose: CBGs at goal  (goal < 150) - Electrolytes:  K 3.8, WNL  Na 132, remains low but stable after Na addition to TPN, equivalent to 0.45% NaCl  Mag low at 1.5, Phos 4.8 is elevated but improved  CorrCa 10.44, CaxPhos = 50.1 (goal < 55), improved - Renal:  SCr wnl, stable. Recorded I/O net +1078mL, NG output 645mL, Drain output 581mL - Weight: 83.6 kg (8/22),  89.4kg on hospital admission - LFTs: WNL except alkaline phos elevated at 192 (8/23), monitor - TGs:  296 (7/18), 266 (7/20), 269 (7/23), 210 (7/28), 285 (7/31) 301 (8/3), increased 323 (8/14), improved to 238 (8/20) - Prealbumin:  <5 (7/20), 6.1 (7/24), 10.1 (7/30) 24.8 (8/6), 26.4 (8/13), decreased to 15.9 (8/20)  NUTRITIONAL GOALS  RD recs (8/20):  122-138 gms Protein (1.5 - 1.7 g/kg) 2030 - 2270 Kcal  Recalculation: Clinimix 5/15 at rate of 115 ml/hr with lipids 3x/week on MWF only would provide: 138g protein and avg 2164 kcal/day  - Glucose infusion rate will be 3.53 mg/kg/min (Maximum 5 mg/kg/min)   PLAN                                                                                                                         Now: Magnesium 4g IV x1 dose KCl 10 mEq IV x2 runs  At 1800 today: ? Continue Clinimix 5/15 at 115 ml/hr.  ? NO ELECTROLYTES due to elevated Phosphorus ? Add sodium to make equivalent to 0.45% NaCl (add 75 mEq of Na/L in a 1:2 [id number] ratio);  above standard Clinimix E formulation due to ongoing and recurrent hyponatremia ? Lipids at 81mls/hr x 12 hours 3 times weekly on MWF  TPN to contain standard multivitamins and trace elements daily  Maintenance IVF NS at 10 ml/hr  CBGs and moderate SSI q6h.  TPN lab panels on Mondays & Thursdays.  Check BMET, Mag & Phos daily   Gretta Arab PharmD, BCPS Pager [phone] 06/19/2017 10:05 AM

## 2017-06-19 NOTE — Consult Note (Signed)
[place] Nurse wound follow up Wound type:Full thickness surgical NPWT dressing change Measurement: see Wednesday Wound bed: 80% beefy red granulating tissue, 15% what I believe looks like brown fecal material under  mesh at distal portion of wound.  PA present and described to family as slough.  Drainage (amount, consistency, odor) moderate amt of brown feculent drainage in tubing and canister, more malodorous today which pt assumed meant infection.  I explained the smell was stool and not related to infection. PA explained WBC count good, VS stable and etc.  Periwound: Wound edges closed from 3-9 o'clock and 10-2 o'clock Dressing procedure/placement/frequency: Removed drape with aerosol Adhesive Remover, wound bed cleansed with NS, one piece of white foam and two pieces of black foam used to obliterate dead space. Drape applied, attached to 125 mmHg and seal achieved.  Bedside RN assisted. Patient's wife, Retia present along with Claiborne Billings, [place] for CCS. Supplies ordered for room for next dressing change which is scheduled 8/27.  We will follow this patient and return Monday 8/27 for next scheduled NWPT dressing change.  Fara Olden, RN-C, WTA-C, [facility] Associate

## 2017-06-20 ENCOUNTER — Inpatient Hospital Stay (HOSPITAL_COMMUNITY): Payer: Medicare Other

## 2017-06-20 LAB — CBC
HCT: 28.1 % — ABNORMAL LOW (ref 39.0–52.0)
HEMOGLOBIN: 9.1 g/dL — AB (ref 13.0–17.0)
MCH: 25.6 pg — ABNORMAL LOW (ref 26.0–34.0)
MCHC: 32.4 g/dL (ref 30.0–36.0)
MCV: 78.9 fL (ref 78.0–100.0)
PLATELETS: 546 10*3/uL — AB (ref 150–400)
RBC: 3.56 MIL/uL — AB (ref 4.22–5.81)
RDW: 16.5 % — ABNORMAL HIGH (ref 11.5–15.5)
WBC: 12.8 10*3/uL — AB (ref 4.0–10.5)

## 2017-06-20 LAB — GLUCOSE, CAPILLARY
GLUCOSE-CAPILLARY: 111 mg/dL — AB (ref 65–99)
GLUCOSE-CAPILLARY: 113 mg/dL — AB (ref 65–99)
GLUCOSE-CAPILLARY: 115 mg/dL — AB (ref 65–99)
Glucose-Capillary: 117 mg/dL — ABNORMAL HIGH (ref 65–99)
Glucose-Capillary: 125 mg/dL — ABNORMAL HIGH (ref 65–99)

## 2017-06-20 LAB — BASIC METABOLIC PANEL
ANION GAP: 10 (ref 5–15)
BUN: 25 mg/dL — ABNORMAL HIGH (ref 6–20)
CHLORIDE: 97 mmol/L — AB (ref 101–111)
CO2: 27 mmol/L (ref 22–32)
Calcium: 8.9 mg/dL (ref 8.9–10.3)
Creatinine, Ser: 0.7 mg/dL (ref 0.61–1.24)
Glucose, Bld: 111 mg/dL — ABNORMAL HIGH (ref 65–99)
POTASSIUM: 3.5 mmol/L (ref 3.5–5.1)
SODIUM: 134 mmol/L — AB (ref 135–145)

## 2017-06-20 LAB — MAGNESIUM: MAGNESIUM: 1.8 mg/dL (ref 1.7–2.4)

## 2017-06-20 LAB — PHOSPHORUS: PHOSPHORUS: 5.3 mg/dL — AB (ref 2.5–4.6)

## 2017-06-20 MED ORDER — SODIUM CHLORIDE 4 MEQ/ML IV SOLN
INTRAVENOUS | Status: AC
  Administered 2017-06-20: 18:00:00 via INTRAVENOUS
  Filled 2017-06-20: qty 2760

## 2017-06-20 MED ORDER — MAGNESIUM SULFATE 4 GM/100ML IV SOLN
4.0000 g | Freq: Once | INTRAVENOUS | Status: AC
  Administered 2017-06-20: 4 g via INTRAVENOUS
  Filled 2017-06-20: qty 100

## 2017-06-20 MED ORDER — POTASSIUM CHLORIDE 10 MEQ/100ML IV SOLN
10.0000 meq | INTRAVENOUS | Status: AC
  Administered 2017-06-20 (×4): 10 meq via INTRAVENOUS
  Filled 2017-06-20: qty 100

## 2017-06-20 MED ORDER — ALTEPLASE 2 MG IJ SOLR
2.0000 mg | Freq: Once | INTRAMUSCULAR | Status: AC
  Administered 2017-06-20: 2 mg
  Filled 2017-06-20: qty 2

## 2017-06-20 NOTE — Progress Notes (Signed)
[place] Progress Note Patient Name: [name] [name] DOB: [birth year] MRN: [id number]   Date of Service  06/20/2017  HPI/Events of Note  Minor Head Trauma - Patient isn't a PCCM patient. However, surgeon on call in OR> Patient slid out of chair striking head. Patient is on Lovenox (prophylaxis dose). Not able to do full neurological exam from [place].   eICU Interventions  Will order: 1. Head CT Scan without contrast STAT. 2. Further management by primary service.      Intervention Category Intermediate Interventions: Other:  Sommer,Steven Cornelia Copa 06/20/2017, 4:05 PM

## 2017-06-20 NOTE — Progress Notes (Addendum)
[place] NOTE   Pharmacy Consult for TPN Indication: prolonged ileus, small bowel leak, multiple bowel surgeries  Patient Measurements: Body mass index is 26.44 kg/m. Filed Weights   06/14/17 0500 06/15/17 0403 06/17/17 0356  Weight: 179 lb 7.3 oz (81.4 kg) 180 lb 8.9 oz (81.9 kg) 184 lb 4.9 oz (83.6 kg)   HPI: 43 yoM admitted on 7/11 for enlarging adrenal mass and planned adrenalectomy.  Pharmacy consulted to dose TPN.  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   7/22 OR:  wash out, tube ileostomy, left with open abdomen & wound vac in place.  Plan for OR on 7/25 for closure 7/25 OR: mesh placed for abdominal closure, wound vac 8/2 extubated, propofol drip off 8/4 TPN off for ~ 5 hours due to line detached from filter 8/10 MD request decrease rate x 1 week 2nd low sodium 8/12 Additional sodium added to TPN due to persistent hyponatremia 8/16 Increasing rate back to goal since sodium has improved with above intervention.   8/20 Prealbumin decreased.  New apparent enteric leak per surgery notes. 8/21 CaPhos product elevated, remove electrolytes from TPN  Insulin requirements past 24 hours: 3 units SSI (no hx DM)  Current Nutrition: Clear Liquid Diet (8/21)  IVF: NS at 10 ml/hr  Central access: PICC line ordered 7/19, placed on 7/20 TPN start date: 7/20  ASSESSMENT                                                                                                           Today, 06/20/17  - Glucose: CBGs at goal (goal < 150) - Electrolytes:  K 3.5, Mag 1.8  Na improved  to 134, remains low but improved after Na addition to TPN equivalent to 0.45% NaCl  Phos increased to 5.3 despite no phos in TPN since 8/21  CorrCa 9.94, CaxPhos = 52.7 (goal < 55) - Renal:  SCr wnl, stable. - Weight: 83.6 kg (8/22),  89.4kg on hospital admission - LFTs: WNL except alkaline phos elevated at 192 (8/23), monitor - TGs:  296 (7/18), 266 (7/20), 269 (7/23), 210 (7/28), 285 (7/31) 301 (8/3), increased 323 (8/14), improved to 238 (8/20) - Prealbumin:  <5 (7/20), 6.1 (7/24), 10.1 (7/30) 24.8 (8/6), 26.4 (8/13), decreased to 15.9 (8/20)  NUTRITIONAL GOALS  RD recs (8/20):  122-138 gms Protein (1.5 - 1.7 g/kg) 2030 - 2270 Kcal  Recalculation: Clinimix 5/15 at rate of 115 ml/hr with lipids 3x/week on MWF only would provide: 138g protein and avg 2164 kcal/day  - Glucose infusion rate will be 3.53 mg/kg/min (Maximum 5 mg/kg/min)   PLAN                                                                                                                         Now: Magnesium 4g IV x1 dose KCl 10 mEq IV x4 runs  At 1800 today: ? Continue Clinimix 5/15 at 115 ml/hr.  ? NO ELECTROLYTES due to elevated Phosphorus ? Add sodium to make equivalent to 0.45% NaCl (add 75 mEq of Na/L, change to 1:1 DV:[id number] ratio on 8/25);  above standard Clinimix sodium concentration due to ongoing and recurrent hyponatremia ? Lipids at 27mls/hr x 12 hours 3 times weekly on MWF  TPN to contain standard multivitamins and trace elements daily  Maintenance IVF NS at 10 ml/hr  CBGs and moderate SSI q6h.  TPN lab panels on Mondays & Thursdays.  Check BMET, Mag & Phos daily   Gretta Arab PharmD, BCPS Pager [phone] 06/20/2017 9:18 AM

## 2017-06-20 NOTE — Progress Notes (Signed)
This RN heard noise from room 1239.  This RN went into room and saw pt on floor.  Pt was sitting up on floor.  Feet and Buttocks was touching floor.  This RN, Karena Addison RT, and Diane CNA helped pt back to chair.  Assessed pt head to toe.  No bleeding or lacerations noted and all bones were intact. Pt was in chair and had slid out to floor.  Pt stated he was "looking for pain pump controller" which was in chair with him.  Pt stated he "bumped" his head on the bedside table when he fell. Stated he hit the posterior right side of head.  No bumps or lacerations noted in that area.  This RN believes pt mistakenly believed tubing on the bed for pain pump controller and was reaching for items on bed and slipped out of chair to floor.  Pt wound vac, 2 gravity drains, JP drain, and NG all remain unharmed.  This RN called charge RN who called Elink "Dr. Oletta Darter.  This RN also called wife and made her aware of situation.  CT of  Head ordered and pt transported to CT stat.  Irven Baltimore, RN

## 2017-06-20 NOTE — Progress Notes (Signed)
Pt's PICC did not have blood return in 2 of the 3 ports. IV team was notified

## 2017-06-20 NOTE — Progress Notes (Signed)
Patient ID: [name] [name], male   DOB: 1961/08/13, 56 y.o.   MRN: [id number] [facility] Surgery Progress Note:   31 Days Post-Op  Subjective: Mental status is clear.  Seen with wife at bedside Objective: Vital signs in last 24 hours: Temp:  [98.1 F (36.7 C)-98.7 F (37.1 C)] 98.7 F (37.1 C) (08/25 0551) Pulse Rate:  [92-119] 100 (08/25 0600) Resp:  [16-21] 19 (08/25 0600) BP: (102-135)/(67-96) 106/71 (08/25 0600) SpO2:  [92 %-100 %] 93 % (08/25 0600)  Intake/Output from previous day: 08/24 0701 - 08/25 0700 In: 3985.2 [I.V.:3245.2; NG/GT:60; IV Piggyback:670] Out: 1912.5 [Urine:925; Emesis/NG output:200; Drains:787.5] Intake/Output this shift: No intake/output data recorded.  Physical Exam: Work of breathing is not labored.  Using IS at present  Lab Results:  Results for orders placed or performed during the hospital encounter of 05/06/17 (from the past 48 hour(s))  Glucose, capillary     Status: Abnormal   Collection Time: 06/18/17 12:11 PM  Result Value Ref Range   Glucose-Capillary 119 (H) 65 - 99 mg/dL  Glucose, capillary     Status: Abnormal   Collection Time: 06/18/17  5:55 PM  Result Value Ref Range   Glucose-Capillary 131 (H) 65 - 99 mg/dL  Glucose, capillary     Status: Abnormal   Collection Time: 06/18/17 11:03 PM  Result Value Ref Range   Glucose-Capillary 124 (H) 65 - 99 mg/dL  Basic metabolic panel     Status: Abnormal   Collection Time: 06/19/17  3:16 AM  Result Value Ref Range   Sodium 132 (L) 135 - 145 mmol/L   Potassium 3.8 3.5 - 5.1 mmol/L   Chloride 98 (L) 101 - 111 mmol/L   CO2 26 22 - 32 mmol/L   Glucose, Bld 117 (H) 65 - 99 mg/dL   BUN 27 (H) 6 - 20 mg/dL   Creatinine, Ser 0.74 0.61 - 1.24 mg/dL   Calcium 9.0 8.9 - 10.3 mg/dL   GFR calc non Af Amer >60 >60 mL/min   GFR calc Af Amer >60 >60 mL/min    Comment: (NOTE) The eGFR has been calculated using the CKD EPI equation. This calculation has not been validated in all clinical  situations. eGFR's persistently <60 mL/min signify possible Chronic Kidney Disease.    Anion gap 8 5 - 15  Magnesium     Status: Abnormal   Collection Time: 06/19/17  3:16 AM  Result Value Ref Range   Magnesium 1.5 (L) 1.7 - 2.4 mg/dL  Phosphorus     Status: Abnormal   Collection Time: 06/19/17  3:16 AM  Result Value Ref Range   Phosphorus 4.8 (H) 2.5 - 4.6 mg/dL  CBC     Status: Abnormal   Collection Time: 06/19/17  3:16 AM  Result Value Ref Range   WBC 13.1 (H) 4.0 - 10.5 K/uL   RBC 3.80 (L) 4.22 - 5.81 MIL/uL   Hemoglobin 9.8 (L) 13.0 - 17.0 g/dL   HCT 30.0 (L) 39.0 - 52.0 %   MCV 78.9 78.0 - 100.0 fL   MCH 25.8 (L) 26.0 - 34.0 pg   MCHC 32.7 30.0 - 36.0 g/dL   RDW 16.5 (H) 11.5 - 15.5 %   Platelets 509 (H) 150 - 400 K/uL  Glucose, capillary     Status: Abnormal   Collection Time: 06/19/17  5:19 AM  Result Value Ref Range   Glucose-Capillary 118 (H) 65 - 99 mg/dL   Comment 1 Notify RN    Comment 2  Document in Chart   Glucose, capillary     Status: Abnormal   Collection Time: 06/19/17 12:13 PM  Result Value Ref Range   Glucose-Capillary 130 (H) 65 - 99 mg/dL  Glucose, capillary     Status: Abnormal   Collection Time: 06/19/17  6:14 PM  Result Value Ref Range   Glucose-Capillary 127 (H) 65 - 99 mg/dL   Comment 1 Notify RN    Comment 2 Document in Chart   Glucose, capillary     Status: Abnormal   Collection Time: 06/20/17 12:02 AM  Result Value Ref Range   Glucose-Capillary 111 (H) 65 - 99 mg/dL   Comment 1 Notify RN    Comment 2 Document in Chart   Basic metabolic panel     Status: Abnormal   Collection Time: 06/20/17  3:02 AM  Result Value Ref Range   Sodium 134 (L) 135 - 145 mmol/L   Potassium 3.5 3.5 - 5.1 mmol/L   Chloride 97 (L) 101 - 111 mmol/L   CO2 27 22 - 32 mmol/L   Glucose, Bld 111 (H) 65 - 99 mg/dL   BUN 25 (H) 6 - 20 mg/dL   Creatinine, Ser 0.70 0.61 - 1.24 mg/dL   Calcium 8.9 8.9 - 10.3 mg/dL   GFR calc non Af Amer >60 >60 mL/min   GFR calc Af  Amer >60 >60 mL/min    Comment: (NOTE) The eGFR has been calculated using the CKD EPI equation. This calculation has not been validated in all clinical situations. eGFR's persistently <60 mL/min signify possible Chronic Kidney Disease.    Anion gap 10 5 - 15  Magnesium     Status: None   Collection Time: 06/20/17  3:02 AM  Result Value Ref Range   Magnesium 1.8 1.7 - 2.4 mg/dL  Phosphorus     Status: Abnormal   Collection Time: 06/20/17  3:02 AM  Result Value Ref Range   Phosphorus 5.3 (H) 2.5 - 4.6 mg/dL  CBC     Status: Abnormal   Collection Time: 06/20/17  3:02 AM  Result Value Ref Range   WBC 12.8 (H) 4.0 - 10.5 K/uL   RBC 3.56 (L) 4.22 - 5.81 MIL/uL   Hemoglobin 9.1 (L) 13.0 - 17.0 g/dL   HCT 28.1 (L) 39.0 - 52.0 %   MCV 78.9 78.0 - 100.0 fL   MCH 25.6 (L) 26.0 - 34.0 pg   MCHC 32.4 30.0 - 36.0 g/dL   RDW 16.5 (H) 11.5 - 15.5 %   Platelets 546 (H) 150 - 400 K/uL  Glucose, capillary     Status: Abnormal   Collection Time: 06/20/17  5:49 AM  Result Value Ref Range   Glucose-Capillary 113 (H) 65 - 99 mg/dL   Comment 1 Notify RN    Comment 2 Document in Chart     Radiology/Results: No results found.  Anti-infectives: Anti-infectives    Start     Dose/Rate Route Frequency Ordered Stop   06/16/17 1200  piperacillin-tazobactam (ZOSYN) IVPB 3.375 g     3.375 g 12.5 mL/hr over 240 Minutes Intravenous Every 8 hours 06/16/17 1026 06/17/17 0004   06/08/17 1200  piperacillin-tazobactam (ZOSYN) IVPB 3.375 g  Status:  Discontinued     3.375 g 12.5 mL/hr over 240 Minutes Intravenous Every 8 hours 06/08/17 1056 06/16/17 1026   05/29/17 2200  ceFEPIme (MAXIPIME) 2 g in dextrose 5 % 50 mL IVPB  Status:  Discontinued     2 g 100 mL/hr over  30 Minutes Intravenous Every 8 hours 05/29/17 2031 06/03/17 0832   05/29/17 2100  metroNIDAZOLE (FLAGYL) IVPB 500 mg  Status:  Discontinued     500 mg 100 mL/hr over 60 Minutes Intravenous Every 8 hours 05/29/17 2029 06/03/17 0832   05/26/17  2200  ceFAZolin (ANCEF) IVPB 1 g/50 mL premix  Status:  Discontinued     1 g 100 mL/hr over 30 Minutes Intravenous Every 8 hours 05/26/17 1008 05/29/17 2023   05/25/17 1100  vancomycin (VANCOCIN) 1,250 mg in sodium chloride 0.9 % 250 mL IVPB  Status:  Discontinued     1,250 mg 166.7 mL/hr over 90 Minutes Intravenous Every 12 hours 05/25/17 0955 05/26/17 0946   05/22/17 1400  anidulafungin (ERAXIS) 100 mg in sodium chloride 0.9 % 100 mL IVPB     100 mg 78 mL/hr over 100 Minutes Intravenous Every 24 hours 05/21/17 1330 05/27/17 1740   05/21/17 1400  anidulafungin (ERAXIS) 200 mg in sodium chloride 0.9 % 200 mL IVPB     200 mg 78 mL/hr over 200 Minutes Intravenous  Once 05/21/17 1330 05/21/17 1940   05/15/17 1000  anidulafungin (ERAXIS) 100 mg in sodium chloride 0.9 % 100 mL IVPB  Status:  Discontinued     100 mg 78 mL/hr over 100 Minutes Intravenous Every 24 hours 05/14/17 0829 05/16/17 0901   05/15/17 1000  metroNIDAZOLE (FLAGYL) IVPB 500 mg  Status:  Discontinued     500 mg 100 mL/hr over 60 Minutes Intravenous Every 8 hours 05/15/17 0903 05/25/17 0938   05/14/17 0900  anidulafungin (ERAXIS) 200 mg in sodium chloride 0.9 % 200 mL IVPB     200 mg 78 mL/hr over 200 Minutes Intravenous  Once 05/14/17 0829 05/14/17 1325   05/13/17 1927  vancomycin (VANCOCIN) 1-5 GM/200ML-% IVPB    Comments:  Ward, Christa   : cabinet override      05/13/17 1927 05/14/17 0744   05/12/17 1400  ceFEPIme (MAXIPIME) 1 g in dextrose 5 % 50 mL IVPB     1 g 100 mL/hr over 30 Minutes Intravenous Every 8 hours 05/12/17 0939 05/26/17 2359   05/12/17 0900  vancomycin (VANCOCIN) IVPB 1000 mg/200 mL premix  Status:  Discontinued     1,000 mg 200 mL/hr over 60 Minutes Intravenous Every 12 hours 05/12/17 0750 05/16/17 0852   05/12/17 0830  aztreonam (AZACTAM) 2 GM IVPB     2 g 100 mL/hr over 30 Minutes Intravenous  Once 05/12/17 0800 05/12/17 0957   05/06/17 0916  vancomycin (VANCOCIN) IVPB 1000 mg/200 mL premix      1,000 mg 200 mL/hr over 60 Minutes Intravenous On call to O.R. 05/06/17 0916 05/06/17 1229      Assessment/Plan: Problem List: Patient Active Problem List   Diagnosis Date Noted  . Peritonitis ([place]) 06/01/2017  . Pressure injury of skin 05/29/2017  . Mucus plugging of bronchi   . Acute respiratory failure with hypoxemia ([place])   . Ileus ([place])   . Hypokalemia 05/14/2017  . Anastomotic leak of intestine 05/14/2017  . Severe sepsis ([place]) 05/14/2017  . Wound dehiscence, surgical 05/14/2017  . Aspiration pneumonia ([place]) 05/14/2017  . Chronic narcotic use 05/10/2017  . Anxiety state 05/10/2017  . Intra-abdominal adhesions s/p SB resection 05/06/2017 05/09/2017  . H/O total adrenalectomy ([place]) 05/06/2017  . Right adrenal mass s/p adrenalectomy 05/06/2017 05/06/2017  . Throat symptom 03/18/2016  . Multinodular goiter 01/19/2016  . Hyperthyroidism 01/19/2016  . Essential hypertension   . Diarrhea 11/30/2014  .  Anemia, iron deficiency 11/01/2014  . Leukocytosis 11/03/2013  . Tobacco abuse 07/26/2013  . COPD (chronic obstructive pulmonary disease) ([place]) 07/26/2013  . Left knee pain 07/26/2013  . Pain in joint, shoulder region 05/03/2013  . GERD 07/24/2008  . 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  Labs OK 31 Days Post-Op    LOS: 45 days   Matt B. Hassell Done, MD, [facility] [phone] beeper [phone]  06/20/2017 8:09 AM

## 2017-06-21 LAB — BASIC METABOLIC PANEL
ANION GAP: 8 (ref 5–15)
BUN: 22 mg/dL — ABNORMAL HIGH (ref 6–20)
CALCIUM: 8.9 mg/dL (ref 8.9–10.3)
CO2: 25 mmol/L (ref 22–32)
Chloride: 98 mmol/L — ABNORMAL LOW (ref 101–111)
Creatinine, Ser: 0.65 mg/dL (ref 0.61–1.24)
GLUCOSE: 109 mg/dL — AB (ref 65–99)
Potassium: 3.7 mmol/L (ref 3.5–5.1)
SODIUM: 131 mmol/L — AB (ref 135–145)

## 2017-06-21 LAB — GLUCOSE, CAPILLARY
GLUCOSE-CAPILLARY: 113 mg/dL — AB (ref 65–99)
GLUCOSE-CAPILLARY: 103 mg/dL — AB (ref 65–99)
Glucose-Capillary: 118 mg/dL — ABNORMAL HIGH (ref 65–99)
Glucose-Capillary: 125 mg/dL — ABNORMAL HIGH (ref 65–99)

## 2017-06-21 LAB — PHOSPHORUS: PHOSPHORUS: 4.8 mg/dL — AB (ref 2.5–4.6)

## 2017-06-21 LAB — MAGNESIUM: Magnesium: 1.6 mg/dL — ABNORMAL LOW (ref 1.7–2.4)

## 2017-06-21 MED ORDER — POTASSIUM CHLORIDE 10 MEQ/100ML IV SOLN
10.0000 meq | INTRAVENOUS | Status: AC
  Administered 2017-06-21 (×4): 10 meq via INTRAVENOUS
  Filled 2017-06-21 (×3): qty 100

## 2017-06-21 MED ORDER — MAGNESIUM SULFATE 4 GM/100ML IV SOLN
4.0000 g | Freq: Once | INTRAVENOUS | Status: AC
  Administered 2017-06-21: 4 g via INTRAVENOUS
  Filled 2017-06-21: qty 100

## 2017-06-21 MED ORDER — TRACE MINERALS CR-CU-MN-SE-ZN 10-1000-500-60 MCG/ML IV SOLN
INTRAVENOUS | Status: AC
  Administered 2017-06-21: 17:00:00 via INTRAVENOUS
  Filled 2017-06-21 (×2): qty 2760

## 2017-06-21 NOTE — Progress Notes (Signed)
[place] NOTE   Pharmacy Consult for TPN Indication: prolonged ileus, small bowel leak, multiple bowel surgeries  Patient Measurements: Body mass index is 26.44 kg/m. Filed Weights   06/14/17 0500 06/15/17 0403 06/17/17 0356  Weight: 179 lb 7.3 oz (81.4 kg) 180 lb 8.9 oz (81.9 kg) 184 lb 4.9 oz (83.6 kg)   HPI: 25 yoM admitted on 7/11 for enlarging adrenal mass and planned adrenalectomy.  Pharmacy consulted to dose TPN.  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   7/22 OR:  wash out, tube ileostomy, left with open abdomen & wound vac in place.  Plan for OR on 7/25 for closure 7/25 OR: mesh placed for abdominal closure, wound vac 8/2 extubated, propofol drip off 8/4 TPN off for ~ 5 hours due to line detached from filter 8/10 MD request decrease rate x 1 week 2nd low sodium 8/12 Additional sodium added to TPN due to persistent hyponatremia 8/16 Increasing rate back to goal since sodium has improved with above intervention.   8/20 Prealbumin decreased.  New apparent enteric leak per surgery notes. 8/21 CaPhos product elevated, remove electrolytes from TPN 8/25 PICC did not have blood return in 2 of the 3 ports.  Alteplase intra-catheter and function returned.  TPN infusing correctly on 8/26 per RN.  Insulin requirements past 24 hours: 0 units SSI (no hx DM)  Current Nutrition: Clear Liquid Diet (8/21)  IVF: NS at 10 ml/hr  Central access: PICC line ordered 7/19, placed on 7/20 TPN start date: 7/20  ASSESSMENT                                                                                                            Today, 06/21/17  - Glucose: CBGs at goal (goal range 100-150) - Electrolytes:  K 3.7, Mag 1.6  Na 131, remains low despite sodium addition to TPN equivalent to 0.45% NaCl  Phos remains elevated at 4.8 despite no phos in TPN since 8/21  CorrCa 9.94, CaPhos = 47.7 (goal < 55) - Renal:  SCr wnl, stable. - Weight: 83.6 kg (8/22),  89.4kg on hospital admission - LFTs: WNL except alkaline phos elevated at 192 (8/23), monitor - TGs:  296 (7/18), 266 (7/20), 269 (7/23), 210 (7/28), 285 (7/31) 301 (8/3), increased 323 (8/14), improved to 238 (8/20) - Prealbumin:  <5 (7/20), 6.1 (7/24), 10.1 (7/30) 24.8 (8/6), 26.4 (8/13), decreased to 15.9 (8/20)  NUTRITIONAL GOALS  RD recs (8/20):  122-138 gms Protein (1.5 - 1.7 g/kg) 2030 - 2270 Kcal  Recalculation: Clinimix 5/15 at rate of 115 ml/hr with lipids 3x/week on MWF only would provide: 138g protein and avg 2164 kcal/day  - Glucose infusion rate will be 3.53 mg/kg/min (Maximum 5 mg/kg/min)   PLAN                                                                                                                         Now:   Magnesium 4g IV x1 dose  KCl 10 mEq IV x4 runs  At 1800 today: ? Continue Clinimix 5/15 at 115 ml/hr.  ? NO ELECTROLYTES due to elevated Phosphorus ? Add sodium to make equivalent to 0.45% NaCl (add 75 mEq of Na/L in a 1:1 [id number] ratio);  above standard Clinimix sodium concentration due to ongoing and recurrent hyponatremia ? Lipids at 24mls/hr x 12 hours 3 times weekly on MWF  TPN to contain standard multivitamins and trace elements daily  Maintenance IVF NS at 10 ml/hr  CBGs and moderate SSI q6h.  TPN lab panels on Mondays & Thursdays.  Check BMET, Mag & Phos daily   Gretta Arab PharmD, BCPS Pager [phone] 06/21/2017 9:34 AM

## 2017-06-21 NOTE — Progress Notes (Signed)
Patient ID: [name] [name], male   DOB: [birth year], 56 y.o.   MRN: [id number] [facility] Surgery Progress Note:   32 Days Post-Op  Subjective: Mental status is alert and orient;  Patient fell yesterday while I was in the OR.  CT of head obtained that was negative.   Objective: Vital signs in last 24 hours: Temp:  [97.9 F (36.6 C)-98.8 F (37.1 C)] 98.6 F (37 C) (08/26 0800) Pulse Rate:  [45-104] 94 (08/26 0600) Resp:  [15-26] 18 (08/26 0600) BP: (106-166)/(62-99) 125/77 (08/26 0600) SpO2:  [93 %-100 %] 97 % (08/26 0600) FiO2 (%):  [97 %] 97 % (08/26 0827)  Intake/Output from previous day: 08/25 0701 - 08/26 0700 In: 2184.3 [I.V.:1374.3; NG/GT:120; IV Piggyback:680] Out: 1710 [Urine:575; Emesis/NG output:350; Drains:785] Intake/Output this shift: No intake/output data recorded.  Physical Exam: Work of breathing is normal.   Foleys are draining OK.  VAC in place  Lab Results:  Results for orders placed or performed during the hospital encounter of 05/06/17 (from the past 48 hour(s))  Glucose, capillary     Status: Abnormal   Collection Time: 06/19/17 12:13 PM  Result Value Ref Range   Glucose-Capillary 130 (H) 65 - 99 mg/dL  Glucose, capillary     Status: Abnormal   Collection Time: 06/19/17  6:14 PM  Result Value Ref Range   Glucose-Capillary 127 (H) 65 - 99 mg/dL   Comment 1 Notify RN    Comment 2 Document in Chart   Glucose, capillary     Status: Abnormal   Collection Time: 06/20/17 12:02 AM  Result Value Ref Range   Glucose-Capillary 111 (H) 65 - 99 mg/dL   Comment 1 Notify RN    Comment 2 Document in Chart   Basic metabolic panel     Status: Abnormal   Collection Time: 06/20/17  3:02 AM  Result Value Ref Range   Sodium 134 (L) 135 - 145 mmol/L   Potassium 3.5 3.5 - 5.1 mmol/L   Chloride 97 (L) 101 - 111 mmol/L   CO2 27 22 - 32 mmol/L   Glucose, Bld 111 (H) 65 - 99 mg/dL   BUN 25 (H) 6 - 20 mg/dL   Creatinine, Ser 0.70 0.61 - 1.24 mg/dL   Calcium 8.9 8.9 -  10.3 mg/dL   GFR calc non Af Amer >60 >60 mL/min   GFR calc Af Amer >60 >60 mL/min    Comment: (NOTE) The eGFR has been calculated using the CKD EPI equation. This calculation has not been validated in all clinical situations. eGFR's persistently <60 mL/min signify possible Chronic Kidney Disease.    Anion gap 10 5 - 15  Magnesium     Status: None   Collection Time: 06/20/17  3:02 AM  Result Value Ref Range   Magnesium 1.8 1.7 - 2.4 mg/dL  Phosphorus     Status: Abnormal   Collection Time: 06/20/17  3:02 AM  Result Value Ref Range   Phosphorus 5.3 (H) 2.5 - 4.6 mg/dL  CBC     Status: Abnormal   Collection Time: 06/20/17  3:02 AM  Result Value Ref Range   WBC 12.8 (H) 4.0 - 10.5 K/uL   RBC 3.56 (L) 4.22 - 5.81 MIL/uL   Hemoglobin 9.1 (L) 13.0 - 17.0 g/dL   HCT 28.1 (L) 39.0 - 52.0 %   MCV 78.9 78.0 - 100.0 fL   MCH 25.6 (L) 26.0 - 34.0 pg   MCHC 32.4 30.0 - 36.0 g/dL  RDW 16.5 (H) 11.5 - 15.5 %   Platelets 546 (H) 150 - 400 K/uL  Glucose, capillary     Status: Abnormal   Collection Time: 06/20/17  5:49 AM  Result Value Ref Range   Glucose-Capillary 113 (H) 65 - 99 mg/dL   Comment 1 Notify RN    Comment 2 Document in Chart   Glucose, capillary     Status: Abnormal   Collection Time: 06/20/17  1:00 PM  Result Value Ref Range   Glucose-Capillary 117 (H) 65 - 99 mg/dL   Comment 1 Notify RN    Comment 2 Document in Chart   Glucose, capillary     Status: Abnormal   Collection Time: 06/20/17  5:19 PM  Result Value Ref Range   Glucose-Capillary 125 (H) 65 - 99 mg/dL  Glucose, capillary     Status: Abnormal   Collection Time: 06/20/17 11:36 PM  Result Value Ref Range   Glucose-Capillary 115 (H) 65 - 99 mg/dL   Comment 1 Notify RN   Basic metabolic panel     Status: Abnormal   Collection Time: 06/21/17  3:34 AM  Result Value Ref Range   Sodium 131 (L) 135 - 145 mmol/L   Potassium 3.7 3.5 - 5.1 mmol/L   Chloride 98 (L) 101 - 111 mmol/L   CO2 25 22 - 32 mmol/L   Glucose,  Bld 109 (H) 65 - 99 mg/dL   BUN 22 (H) 6 - 20 mg/dL   Creatinine, Ser 0.65 0.61 - 1.24 mg/dL   Calcium 8.9 8.9 - 10.3 mg/dL   GFR calc non Af Amer >60 >60 mL/min   GFR calc Af Amer >60 >60 mL/min    Comment: (NOTE) The eGFR has been calculated using the CKD EPI equation. This calculation has not been validated in all clinical situations. eGFR's persistently <60 mL/min signify possible Chronic Kidney Disease.    Anion gap 8 5 - 15  Magnesium     Status: Abnormal   Collection Time: 06/21/17  3:34 AM  Result Value Ref Range   Magnesium 1.6 (L) 1.7 - 2.4 mg/dL  Phosphorus     Status: Abnormal   Collection Time: 06/21/17  3:34 AM  Result Value Ref Range   Phosphorus 4.8 (H) 2.5 - 4.6 mg/dL  Glucose, capillary     Status: Abnormal   Collection Time: 06/21/17  5:27 AM  Result Value Ref Range   Glucose-Capillary 118 (H) 65 - 99 mg/dL   Comment 1 Notify RN     Radiology/Results: Ct Head Wo Contrast  Result Date: 06/20/2017 CLINICAL DATA:  Head trauma EXAM: CT HEAD WITHOUT CONTRAST TECHNIQUE: Contiguous axial images were obtained from the base of the skull through the vertex without intravenous contrast. COMPARISON:  None. FINDINGS: Brain: No acute intracranial abnormality. Specifically, no hemorrhage, hydrocephalus, mass lesion, acute infarction, or significant intracranial injury. Vascular: No hyperdense vessel or unexpected calcification. Skull: No acute calvarial abnormality. Sinuses/Orbits: Visualized paranasal sinuses and mastoids clear. Orbital soft tissues unremarkable. Other: None IMPRESSION: No acute intracranial abnormality. Electronically Signed   By: Rolm Baptise M.D.   On: 06/20/2017 16:59    Anti-infectives: Anti-infectives    Start     Dose/Rate Route Frequency Ordered Stop   06/16/17 1200  piperacillin-tazobactam (ZOSYN) IVPB 3.375 g     3.375 g 12.5 mL/hr over 240 Minutes Intravenous Every 8 hours 06/16/17 1026 06/17/17 0004   06/08/17 1200  piperacillin-tazobactam  (ZOSYN) IVPB 3.375 g  Status:  Discontinued  3.375 g 12.5 mL/hr over 240 Minutes Intravenous Every 8 hours 06/08/17 1056 06/16/17 1026   05/29/17 2200  ceFEPIme (MAXIPIME) 2 g in dextrose 5 % 50 mL IVPB  Status:  Discontinued     2 g 100 mL/hr over 30 Minutes Intravenous Every 8 hours 05/29/17 2031 06/03/17 0832   05/29/17 2100  metroNIDAZOLE (FLAGYL) IVPB 500 mg  Status:  Discontinued     500 mg 100 mL/hr over 60 Minutes Intravenous Every 8 hours 05/29/17 2029 06/03/17 0832   05/26/17 2200  ceFAZolin (ANCEF) IVPB 1 g/50 mL premix  Status:  Discontinued     1 g 100 mL/hr over 30 Minutes Intravenous Every 8 hours 05/26/17 1008 05/29/17 2023   05/25/17 1100  vancomycin (VANCOCIN) 1,250 mg in sodium chloride 0.9 % 250 mL IVPB  Status:  Discontinued     1,250 mg 166.7 mL/hr over 90 Minutes Intravenous Every 12 hours 05/25/17 0955 05/26/17 0946   05/22/17 1400  anidulafungin (ERAXIS) 100 mg in sodium chloride 0.9 % 100 mL IVPB     100 mg 78 mL/hr over 100 Minutes Intravenous Every 24 hours 05/21/17 1330 05/27/17 1740   05/21/17 1400  anidulafungin (ERAXIS) 200 mg in sodium chloride 0.9 % 200 mL IVPB     200 mg 78 mL/hr over 200 Minutes Intravenous  Once 05/21/17 1330 05/21/17 1940   05/15/17 1000  anidulafungin (ERAXIS) 100 mg in sodium chloride 0.9 % 100 mL IVPB  Status:  Discontinued     100 mg 78 mL/hr over 100 Minutes Intravenous Every 24 hours 05/14/17 0829 05/16/17 0901   05/15/17 1000  metroNIDAZOLE (FLAGYL) IVPB 500 mg  Status:  Discontinued     500 mg 100 mL/hr over 60 Minutes Intravenous Every 8 hours 05/15/17 0903 05/25/17 0938   05/14/17 0900  anidulafungin (ERAXIS) 200 mg in sodium chloride 0.9 % 200 mL IVPB     200 mg 78 mL/hr over 200 Minutes Intravenous  Once 05/14/17 0829 05/14/17 1325   05/13/17 1927  vancomycin (VANCOCIN) 1-5 GM/200ML-% IVPB    Comments:  Ward, Christa   : cabinet override      05/13/17 1927 05/14/17 0744   05/12/17 1400  ceFEPIme (MAXIPIME) 1 g in  dextrose 5 % 50 mL IVPB     1 g 100 mL/hr over 30 Minutes Intravenous Every 8 hours 05/12/17 0939 05/26/17 2359   05/12/17 0900  vancomycin (VANCOCIN) IVPB 1000 mg/200 mL premix  Status:  Discontinued     1,000 mg 200 mL/hr over 60 Minutes Intravenous Every 12 hours 05/12/17 0750 05/16/17 0852   05/12/17 0830  aztreonam (AZACTAM) 2 GM IVPB     2 g 100 mL/hr over 30 Minutes Intravenous  Once 05/12/17 0800 05/12/17 0957   05/06/17 0916  vancomycin (VANCOCIN) IVPB 1000 mg/200 mL premix     1,000 mg 200 mL/hr over 60 Minutes Intravenous On call to O.R. 05/06/17 0916 05/06/17 1229      Assessment/Plan: Problem List: Patient Active Problem List   Diagnosis Date Noted  . Peritonitis ([place]) 06/01/2017  . Pressure injury of skin 05/29/2017  . Mucus plugging of bronchi   . Acute respiratory failure with hypoxemia ([place])   . Ileus ([place])   . Hypokalemia 05/14/2017  . Anastomotic leak of intestine 05/14/2017  . Severe sepsis ([place]) 05/14/2017  . Wound dehiscence, surgical 05/14/2017  . Aspiration pneumonia ([place]) 05/14/2017  . Chronic narcotic use 05/10/2017  . Anxiety state 05/10/2017  . Intra-abdominal adhesions s/p SB resection  05/06/2017 05/09/2017  . H/O total adrenalectomy ([place]) 05/06/2017  . Right adrenal mass s/p adrenalectomy 05/06/2017 05/06/2017  . Throat symptom 03/18/2016  . Multinodular goiter 01/19/2016  . Hyperthyroidism 01/19/2016  . Essential hypertension   . Diarrhea 11/30/2014  . Anemia, iron deficiency 11/01/2014  . Leukocytosis 11/03/2013  . Tobacco abuse 07/26/2013  . COPD (chronic obstructive pulmonary disease) ([place]) 07/26/2013  . Left knee pain 07/26/2013  . Pain in joint, shoulder region 05/03/2013  . GERD 07/24/2008  . 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  CT head ok.  No other problems noted.   32 Days Post-Op    LOS: 46 days   Matt B. Hassell Done, MD, [facility] [phone] beeper [phone]  06/21/2017 9:35 AM

## 2017-06-21 NOTE — Progress Notes (Signed)
Pt was lying in bed awake and alert when I arrived. His sister was bedside. Pt's sister and I talked about how much btr he is doing. Pt talked w/nurse staff about pain he was having; which they took care of. Pt was appreciative of visit and prayer. Please page if additional support is needed. [place], [place]   06/21/17 1900  Clinical Encounter Type  Visited With Patient and family together

## 2017-06-22 LAB — DIFFERENTIAL
BASOS PCT: 0 %
Basophils Absolute: 0 10*3/uL (ref 0.0–0.1)
EOS ABS: 0.6 10*3/uL (ref 0.0–0.7)
Eosinophils Relative: 5 %
LYMPHS ABS: 4 10*3/uL (ref 0.7–4.0)
Lymphocytes Relative: 35 %
MONO ABS: 1 10*3/uL (ref 0.1–1.0)
MONOS PCT: 9 %
Neutro Abs: 5.7 10*3/uL (ref 1.7–7.7)
Neutrophils Relative %: 51 %

## 2017-06-22 LAB — CBC
HCT: 28.1 % — ABNORMAL LOW (ref 39.0–52.0)
HEMOGLOBIN: 9.1 g/dL — AB (ref 13.0–17.0)
MCH: 25.6 pg — AB (ref 26.0–34.0)
MCHC: 32.4 g/dL (ref 30.0–36.0)
MCV: 78.9 fL (ref 78.0–100.0)
PLATELETS: 586 10*3/uL — AB (ref 150–400)
RBC: 3.56 MIL/uL — AB (ref 4.22–5.81)
RDW: 16.7 % — ABNORMAL HIGH (ref 11.5–15.5)
WBC: 11.3 10*3/uL — ABNORMAL HIGH (ref 4.0–10.5)

## 2017-06-22 LAB — COMPREHENSIVE METABOLIC PANEL
ALT: 25 U/L (ref 17–63)
AST: 26 U/L (ref 15–41)
Albumin: 2.7 g/dL — ABNORMAL LOW (ref 3.5–5.0)
Alkaline Phosphatase: 172 U/L — ABNORMAL HIGH (ref 38–126)
Anion gap: 8 (ref 5–15)
BILIRUBIN TOTAL: 0.8 mg/dL (ref 0.3–1.2)
BUN: 22 mg/dL — AB (ref 6–20)
CHLORIDE: 99 mmol/L — AB (ref 101–111)
CO2: 24 mmol/L (ref 22–32)
Calcium: 8.9 mg/dL (ref 8.9–10.3)
Creatinine, Ser: 0.64 mg/dL (ref 0.61–1.24)
Glucose, Bld: 115 mg/dL — ABNORMAL HIGH (ref 65–99)
Potassium: 3.6 mmol/L (ref 3.5–5.1)
Sodium: 131 mmol/L — ABNORMAL LOW (ref 135–145)
TOTAL PROTEIN: 7 g/dL (ref 6.5–8.1)

## 2017-06-22 LAB — GLUCOSE, CAPILLARY
GLUCOSE-CAPILLARY: 116 mg/dL — AB (ref 65–99)
GLUCOSE-CAPILLARY: 128 mg/dL — AB (ref 65–99)
GLUCOSE-CAPILLARY: 118 mg/dL — AB (ref 65–99)

## 2017-06-22 LAB — PREALBUMIN: PREALBUMIN: 19.7 mg/dL (ref 18–38)

## 2017-06-22 LAB — TRIGLYCERIDES: TRIGLYCERIDES: 203 mg/dL — AB (ref <150)

## 2017-06-22 LAB — MAGNESIUM: Magnesium: 1.6 mg/dL — ABNORMAL LOW (ref 1.7–2.4)

## 2017-06-22 LAB — PHOSPHORUS: Phosphorus: 4.6 mg/dL (ref 2.5–4.6)

## 2017-06-22 MED ORDER — MAGNESIUM SULFATE 4 GM/100ML IV SOLN
4.0000 g | Freq: Once | INTRAVENOUS | Status: AC
  Administered 2017-06-22: 4 g via INTRAVENOUS
  Filled 2017-06-22: qty 100

## 2017-06-22 MED ORDER — POTASSIUM CHLORIDE 10 MEQ/100ML IV SOLN
10.0000 meq | INTRAVENOUS | Status: AC
  Administered 2017-06-22 (×4): 10 meq via INTRAVENOUS
  Filled 2017-06-22 (×4): qty 100

## 2017-06-22 MED ORDER — FAT EMULSION 20 % IV EMUL
240.0000 mL | INTRAVENOUS | Status: AC
  Administered 2017-06-22: 240 mL via INTRAVENOUS
  Filled 2017-06-22: qty 250

## 2017-06-22 MED ORDER — TRACE MINERALS CR-CU-MN-SE-ZN 10-1000-500-60 MCG/ML IV SOLN
INTRAVENOUS | Status: AC
  Administered 2017-06-22: 18:00:00 via INTRAVENOUS
  Filled 2017-06-22: qty 2000

## 2017-06-22 MED ORDER — PRO-STAT SUGAR FREE PO LIQD
30.0000 mL | Freq: Every day | ORAL | Status: DC
  Administered 2017-06-22 – 2017-06-27 (×5): 30 mL via ORAL
  Filled 2017-06-22 (×9): qty 30

## 2017-06-22 MED ORDER — FUROSEMIDE 10 MG/ML IJ SOLN
20.0000 mg | Freq: Every day | INTRAMUSCULAR | Status: DC
  Administered 2017-06-22 – 2017-06-28 (×7): 20 mg via INTRAVENOUS
  Filled 2017-06-22 (×7): qty 2

## 2017-06-22 NOTE — Care Management Note (Signed)
Case Management Note  Patient Details  Name: [name] [name] MRN: [id number] Date of Birth: 24-Jul-1961  Subjective/Objective:      wcb trending down,ng tube remains in place,iv flds and iv tpn, fell on pm of [id number] injuries sustained, wound vac intact to abd wound.              Action/Plan: Date:  June 22, 2017 Chart reviewed for concurrent status and case management needs. Will continue to follow patient progress. Discharge Planning: following for needs Expected discharge date: [id number] [name], BSN, [place], [place]  Expected Discharge Date:                  Expected Discharge Plan:  Home/Self Care  In-House Referral:     Discharge planning Services  CM Consult  Post Acute Care Choice:    Choice offered to:     DME Arranged:    DME Agency:     HH Arranged:    HH Agency:     Status of Service:  In process, will continue to follow  If discussed at Long Length of Stay Meetings, dates discussed:    Additional Comments:  Leeroy Cha, RN 06/22/2017, 8:42 AM

## 2017-06-22 NOTE — Progress Notes (Signed)
Physical Therapy Treatment Patient Details Name: MARTAVIOUS HARTEL MRN: [id number] DOB: Mar 10, 1961 Today's Date: 06/22/2017    History of Present Illness  56 y.o. 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 On 7/18, he developed a leak therefore was taken back to OR for re-do of ex-lap and repair of leak on 05/20/17.  He returned to the ICU on the vent . Extubated on 05/28/17.     PT Comments    Pt progressing well with mobility, he tolerated increased distance of 80' with ambulation, HR 122. Performed BLE strengthening exercises.   Follow Up Recommendations  SNF;Supervision/Assistance - 24 hour     Equipment Recommendations  Rolling walker with 5" wheels    Recommendations for Other Services       Precautions / Restrictions Precautions Precaution Comments: multiple lines in abdomen, VAC, NG, and JP drain Restrictions Weight Bearing Restrictions: No    Mobility  Bed Mobility Overal bed mobility: Needs Assistance Bed Mobility: Supine to Sit     Supine to sit: +2 for safety/equipment;Min guard     General bed mobility comments: Pt requires min guard for safety and managing lines, pt able to pull himself upright at EOB with unilateral UE support   Transfers Overall transfer level: Needs assistance Equipment used: Rolling walker (2 wheeled) Transfers: Sit to/from Stand Sit to Stand: Min guard;+2 safety/equipment         General transfer comment: Pt able to stand and steady, min guard for safety and management of lines, VCs hand placement  Ambulation/Gait Ambulation/Gait assistance: Min guard;+2 safety/equipment Ambulation Distance (Feet): 80 Feet Assistive device: Rolling walker (2 wheeled) Gait Pattern/deviations: Step-through pattern;Decreased stride length;Narrow base of support Gait  velocity: decr Gait velocity interpretation: Below normal speed for age/gender General Gait Details: Pt ambulated 80 ft with +2 assist for safety and to manage lines, IV pole and recliner,  HR 122 max with walking, distance limited by fatigue   Stairs            Wheelchair Mobility    Modified Rankin (Stroke Patients Only)       Balance           Standing balance support: During functional activity;Bilateral upper extremity supported Standing balance-Leahy Scale: Poor Standing balance comment: requires UE support for static standing                            Cognition Arousal/Alertness: Awake/alert Behavior During Therapy: WFL for tasks assessed/performed Overall Cognitive Status: Within Functional Limits for tasks assessed                                        Exercises General Exercises - Lower Extremity Ankle Circles/Pumps: AROM;Both;10 reps;Supine Quad Sets: AROM;Both;10 reps;Supine Gluteal Sets: AROM;Both;5 reps;Supine Short Arc Quad: AROM;Both;10 reps;Supine Heel Slides: AROM;Both;10 reps (with manually resisted extension)    General Comments        Pertinent Vitals/Pain Pain Assessment: No/denies pain    Home Living                      Prior Function            PT Goals (current goals can now be  found in the care plan section) Acute Rehab PT Goals Patient Stated Goal: to go fishing PT Goal Formulation: With patient/family Time For Goal Achievement: 06/29/17 Potential to Achieve Goals: Fair Progress towards PT goals: Progressing toward goals    Frequency    Min 3X/week      PT Plan Current plan remains appropriate    Co-evaluation              AM-PAC PT "6 Clicks" Daily Activity  Outcome Measure  Difficulty turning over in bed (including adjusting bedclothes, sheets and blankets)?: A Little Difficulty moving from lying on back to sitting on the side of the bed? : A Little Difficulty  sitting down on and standing up from a chair with arms (e.g., wheelchair, bedside commode, etc,.)?: A Little Help needed moving to and from a bed to chair (including a wheelchair)?: A Little Help needed walking in hospital room?: A Little Help needed climbing 3-5 steps with a railing? : Total 6 Click Score: 16    End of Session Equipment Utilized During Treatment: Gait belt Activity Tolerance: Patient tolerated treatment well Patient left: in chair;with call bell/phone within reach;with family/visitor present;with chair alarm set Nurse Communication: Mobility status PT Visit Diagnosis: Difficulty in walking, not elsewhere classified (R26.2);Muscle weakness (generalized) (M62.81)     Time: 1610-9604 PT Time Calculation (min) (ACUTE ONLY): 26 min  Charges:  $Gait Training: 8-22 mins $Therapeutic Exercise: 8-22 mins                    G Codes:          Philomena Doheny 06/22/2017, 12:24 PM [phone]

## 2017-06-22 NOTE — Consult Note (Signed)
[facility] Nurse wound consult note Reason for Consult: Routine change of NPWT dressing Wound type: Surgical Pressure Injury POA: N/A Measurement: 5.5cm x 22cm x 1.8cm Wound bed: 80% red, moist granulating tissue, 20% brown/feculent material at 6 o'clock.  No mesh noted to be lifting in the superior portion of wound. Drainage (amount, consistency, odor) As noted above Periwound:Intact. Wound edges are closed (epibole) from 3-9 o'clock and from 10-2 o'clock. Dressing procedure/placement/frequency: Spray releaser used to remove drape, dressing easily removed (1 piece of white foam and 2 pieces of black foam).  Wound cleansed with NS, and gently patted dry.  1 piece of white foam used to cover 80% of wound bed, 1 piece of black foam used to cover white foam and rest of wound bed (2 pieces total). Drape applied and dressing is attached to 161mmHg of continuous negative pressure via T.R.A.C. pad.  An immediate seal was achieved. Patient used PCA pump 3 times during procedure. Tolerated well. Next dressing change is scheduel for Wednesday, August 29.  [facility] nursing team will follow, and will remain available to this patient, the nursing, surgical and medical teams.   Thanks, Maudie Flakes, MSN, RN, [place], Arther Abbott  Pager# [phone]

## 2017-06-22 NOTE — Progress Notes (Signed)
Starting to make some progress with mobility/PT Afebrile, WBC stable Continues to have Net Fluid intake of around 3L per day and hence will change lasix from QOD to Daily while he is on TNA and also getting IVF due to PCA and other IV meds -will follow peripherally  Domenic Polite MD

## 2017-06-22 NOTE — Progress Notes (Signed)
Patient ID: [name] [name], male   DOB: 10-Feb-1961, 56 y.o.   MRN: [id number] [facility] Surgery Progress Note:   33 Days Post-Op  Subjective: Mental status is clear.  He feels like he needs to have a BM.  Have asked nursing to place on toilet and see he can evacuate his bowels Objective: Vital signs in last 24 hours: Temp:  [97.7 F (36.5 C)-99.2 F (37.3 C)] 99.2 F (37.3 C) (08/27 0800) Pulse Rate:  [86-100] 94 (08/27 0938) Resp:  [16-23] 18 (08/27 0938) BP: (109-138)/(56-86) 113/76 (08/27 0600) SpO2:  [96 %-99 %] 98 % (08/27 0938)  Intake/Output from previous day: 08/26 0701 - 08/27 0700 In: 2356.4 [I.V.:1486.4; NG/GT:190; IV Piggyback:680] Out: 2205 [Urine:1150; Emesis/NG output:370; Drains:685] Intake/Output this shift: Total I/O In: -  Out: 780 [Urine:780]  Physical Exam: Work of breathing is not labored.  VAC intact;  Drainage unchanged.  No pain today  Lab Results:  Results for orders placed or performed during the hospital encounter of 05/06/17 (from the past 48 hour(s))  Glucose, capillary     Status: Abnormal   Collection Time: 06/20/17  1:00 PM  Result Value Ref Range   Glucose-Capillary 117 (H) 65 - 99 mg/dL   Comment 1 Notify RN    Comment 2 Document in Chart   Glucose, capillary     Status: Abnormal   Collection Time: 06/20/17  5:19 PM  Result Value Ref Range   Glucose-Capillary 125 (H) 65 - 99 mg/dL  Glucose, capillary     Status: Abnormal   Collection Time: 06/20/17 11:36 PM  Result Value Ref Range   Glucose-Capillary 115 (H) 65 - 99 mg/dL   Comment 1 Notify RN   Basic metabolic panel     Status: Abnormal   Collection Time: 06/21/17  3:34 AM  Result Value Ref Range   Sodium 131 (L) 135 - 145 mmol/L   Potassium 3.7 3.5 - 5.1 mmol/L   Chloride 98 (L) 101 - 111 mmol/L   CO2 25 22 - 32 mmol/L   Glucose, Bld 109 (H) 65 - 99 mg/dL   BUN 22 (H) 6 - 20 mg/dL   Creatinine, Ser 0.65 0.61 - 1.24 mg/dL   Calcium 8.9 8.9 - 10.3 mg/dL   GFR calc non Af  Amer >60 >60 mL/min   GFR calc Af Amer >60 >60 mL/min    Comment: (NOTE) The eGFR has been calculated using the CKD EPI equation. This calculation has not been validated in all clinical situations. eGFR's persistently <60 mL/min signify possible Chronic Kidney Disease.    Anion gap 8 5 - 15  Magnesium     Status: Abnormal   Collection Time: 06/21/17  3:34 AM  Result Value Ref Range   Magnesium 1.6 (L) 1.7 - 2.4 mg/dL  Phosphorus     Status: Abnormal   Collection Time: 06/21/17  3:34 AM  Result Value Ref Range   Phosphorus 4.8 (H) 2.5 - 4.6 mg/dL  Glucose, capillary     Status: Abnormal   Collection Time: 06/21/17  5:27 AM  Result Value Ref Range   Glucose-Capillary 118 (H) 65 - 99 mg/dL   Comment 1 Notify RN   Glucose, capillary     Status: Abnormal   Collection Time: 06/21/17 12:08 PM  Result Value Ref Range   Glucose-Capillary 113 (H) 65 - 99 mg/dL  Glucose, capillary     Status: Abnormal   Collection Time: 06/21/17  4:48 PM  Result Value Ref Range  Glucose-Capillary 125 (H) 65 - 99 mg/dL   Comment 1 Notify RN    Comment 2 Document in Chart   Glucose, capillary     Status: Abnormal   Collection Time: 06/21/17 11:26 PM  Result Value Ref Range   Glucose-Capillary 103 (H) 65 - 99 mg/dL  Glucose, capillary     Status: Abnormal   Collection Time: 06/22/17  5:30 AM  Result Value Ref Range   Glucose-Capillary 116 (H) 65 - 99 mg/dL  Magnesium     Status: Abnormal   Collection Time: 06/22/17  5:43 AM  Result Value Ref Range   Magnesium 1.6 (L) 1.7 - 2.4 mg/dL  Phosphorus     Status: None   Collection Time: 06/22/17  5:43 AM  Result Value Ref Range   Phosphorus 4.6 2.5 - 4.6 mg/dL  Differential     Status: None   Collection Time: 06/22/17  5:43 AM  Result Value Ref Range   Neutrophils Relative % 51 %   Neutro Abs 5.7 1.7 - 7.7 K/uL   Lymphocytes Relative 35 %   Lymphs Abs 4.0 0.7 - 4.0 K/uL   Monocytes Relative 9 %   Monocytes Absolute 1.0 0.1 - 1.0 K/uL    Eosinophils Relative 5 %   Eosinophils Absolute 0.6 0.0 - 0.7 K/uL   Basophils Relative 0 %   Basophils Absolute 0.0 0.0 - 0.1 K/uL  Comprehensive metabolic panel     Status: Abnormal   Collection Time: 06/22/17  5:43 AM  Result Value Ref Range   Sodium 131 (L) 135 - 145 mmol/L   Potassium 3.6 3.5 - 5.1 mmol/L   Chloride 99 (L) 101 - 111 mmol/L   CO2 24 22 - 32 mmol/L   Glucose, Bld 115 (H) 65 - 99 mg/dL   BUN 22 (H) 6 - 20 mg/dL   Creatinine, Ser 0.64 0.61 - 1.24 mg/dL   Calcium 8.9 8.9 - 10.3 mg/dL   Total Protein 7.0 6.5 - 8.1 g/dL   Albumin 2.7 (L) 3.5 - 5.0 g/dL   AST 26 15 - 41 U/L   ALT 25 17 - 63 U/L   Alkaline Phosphatase 172 (H) 38 - 126 U/L   Total Bilirubin 0.8 0.3 - 1.2 mg/dL   GFR calc non Af Amer >60 >60 mL/min   GFR calc Af Amer >60 >60 mL/min    Comment: (NOTE) The eGFR has been calculated using the CKD EPI equation. This calculation has not been validated in all clinical situations. eGFR's persistently <60 mL/min signify possible Chronic Kidney Disease.    Anion gap 8 5 - 15  Prealbumin     Status: None   Collection Time: 06/22/17  5:43 AM  Result Value Ref Range   Prealbumin 19.7 18 - 38 mg/dL    Comment: Performed at [facility] [address], [place], [place] [address]  Triglycerides     Status: Abnormal   Collection Time: 06/22/17  5:43 AM  Result Value Ref Range   Triglycerides 203 (H) <150 mg/dL    Comment: Performed at [facility] [address], [place] [address]  CBC     Status: Abnormal   Collection Time: 06/22/17  5:43 AM  Result Value Ref Range   WBC 11.3 (H) 4.0 - 10.5 K/uL   RBC 3.56 (L) 4.22 - 5.81 MIL/uL   Hemoglobin 9.1 (L) 13.0 - 17.0 g/dL   HCT 28.1 (L) 39.0 - 52.0 %   MCV 78.9  78.0 - 100.0 fL   MCH 25.6 (L) 26.0 - 34.0 pg   MCHC 32.4 30.0 - 36.0 g/dL   RDW 16.7 (H) 11.5 - 15.5 %   Platelets 586 (H) 150 - 400 K/uL    Radiology/Results: Ct Head Wo Contrast  Result Date: 06/20/2017 CLINICAL DATA:   Head trauma EXAM: CT HEAD WITHOUT CONTRAST TECHNIQUE: Contiguous axial images were obtained from the base of the skull through the vertex without intravenous contrast. COMPARISON:  None. FINDINGS: Brain: No acute intracranial abnormality. Specifically, no hemorrhage, hydrocephalus, mass lesion, acute infarction, or significant intracranial injury. Vascular: No hyperdense vessel or unexpected calcification. Skull: No acute calvarial abnormality. Sinuses/Orbits: Visualized paranasal sinuses and mastoids clear. Orbital soft tissues unremarkable. Other: None IMPRESSION: No acute intracranial abnormality. Electronically Signed   By: Rolm Baptise M.D.   On: 06/20/2017 16:59    Anti-infectives: Anti-infectives    Start     Dose/Rate Route Frequency Ordered Stop   06/16/17 1200  piperacillin-tazobactam (ZOSYN) IVPB 3.375 g     3.375 g 12.5 mL/hr over 240 Minutes Intravenous Every 8 hours 06/16/17 1026 06/17/17 0004   06/08/17 1200  piperacillin-tazobactam (ZOSYN) IVPB 3.375 g  Status:  Discontinued     3.375 g 12.5 mL/hr over 240 Minutes Intravenous Every 8 hours 06/08/17 1056 06/16/17 1026   05/29/17 2200  ceFEPIme (MAXIPIME) 2 g in dextrose 5 % 50 mL IVPB  Status:  Discontinued     2 g 100 mL/hr over 30 Minutes Intravenous Every 8 hours 05/29/17 2031 06/03/17 0832   05/29/17 2100  metroNIDAZOLE (FLAGYL) IVPB 500 mg  Status:  Discontinued     500 mg 100 mL/hr over 60 Minutes Intravenous Every 8 hours 05/29/17 2029 06/03/17 0832   05/26/17 2200  ceFAZolin (ANCEF) IVPB 1 g/50 mL premix  Status:  Discontinued     1 g 100 mL/hr over 30 Minutes Intravenous Every 8 hours 05/26/17 1008 05/29/17 2023   05/25/17 1100  vancomycin (VANCOCIN) 1,250 mg in sodium chloride 0.9 % 250 mL IVPB  Status:  Discontinued     1,250 mg 166.7 mL/hr over 90 Minutes Intravenous Every 12 hours 05/25/17 0955 05/26/17 0946   05/22/17 1400  anidulafungin (ERAXIS) 100 mg in sodium chloride 0.9 % 100 mL IVPB     100 mg 78 mL/hr  over 100 Minutes Intravenous Every 24 hours 05/21/17 1330 05/27/17 1740   05/21/17 1400  anidulafungin (ERAXIS) 200 mg in sodium chloride 0.9 % 200 mL IVPB     200 mg 78 mL/hr over 200 Minutes Intravenous  Once 05/21/17 1330 05/21/17 1940   05/15/17 1000  anidulafungin (ERAXIS) 100 mg in sodium chloride 0.9 % 100 mL IVPB  Status:  Discontinued     100 mg 78 mL/hr over 100 Minutes Intravenous Every 24 hours 05/14/17 0829 05/16/17 0901   05/15/17 1000  metroNIDAZOLE (FLAGYL) IVPB 500 mg  Status:  Discontinued     500 mg 100 mL/hr over 60 Minutes Intravenous Every 8 hours 05/15/17 0903 05/25/17 0938   05/14/17 0900  anidulafungin (ERAXIS) 200 mg in sodium chloride 0.9 % 200 mL IVPB     200 mg 78 mL/hr over 200 Minutes Intravenous  Once 05/14/17 0829 05/14/17 1325   05/13/17 1927  vancomycin (VANCOCIN) 1-5 GM/200ML-% IVPB    Comments:  Ward, Christa   : cabinet override      05/13/17 1927 05/14/17 0744   05/12/17 1400  ceFEPIme (MAXIPIME) 1 g in dextrose 5 % 50 mL IVPB  1 g 100 mL/hr over 30 Minutes Intravenous Every 8 hours 05/12/17 0939 05/26/17 2359   05/12/17 0900  vancomycin (VANCOCIN) IVPB 1000 mg/200 mL premix  Status:  Discontinued     1,000 mg 200 mL/hr over 60 Minutes Intravenous Every 12 hours 05/12/17 0750 05/16/17 0852   05/12/17 0830  aztreonam (AZACTAM) 2 GM IVPB     2 g 100 mL/hr over 30 Minutes Intravenous  Once 05/12/17 0800 05/12/17 0957   05/06/17 0916  vancomycin (VANCOCIN) IVPB 1000 mg/200 mL premix     1,000 mg 200 mL/hr over 60 Minutes Intravenous On call to O.R. 05/06/17 0916 05/06/17 1229      Assessment/Plan: Problem List: Patient Active Problem List   Diagnosis Date Noted  . Peritonitis ([place]) 06/01/2017  . Pressure injury of skin 05/29/2017  . Mucus plugging of bronchi   . Acute respiratory failure with hypoxemia ([place])   . Ileus ([place])   . Hypokalemia 05/14/2017  . Anastomotic leak of intestine 05/14/2017  . Severe sepsis ([place]) 05/14/2017  . Wound  dehiscence, surgical 05/14/2017  . Aspiration pneumonia ([place]) 05/14/2017  . Chronic narcotic use 05/10/2017  . Anxiety state 05/10/2017  . Intra-abdominal adhesions s/p SB resection 05/06/2017 05/09/2017  . H/O total adrenalectomy ([place]) 05/06/2017  . Right adrenal mass s/p adrenalectomy 05/06/2017 05/06/2017  . Throat symptom 03/18/2016  . Multinodular goiter 01/19/2016  . Hyperthyroidism 01/19/2016  . Essential hypertension   . Diarrhea 11/30/2014  . Anemia, iron deficiency 11/01/2014  . Leukocytosis 11/03/2013  . Tobacco abuse 07/26/2013  . COPD (chronic obstructive pulmonary disease) ([place]) 07/26/2013  . Left knee pain 07/26/2013  . Pain in joint, shoulder region 05/03/2013  . GERD 07/24/2008  . 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   33 Days Post-Op    LOS: 47 days   Matt B. Hassell Done, MD, [facility] [phone] beeper [phone]  06/22/2017 10:33 AM

## 2017-06-22 NOTE — Progress Notes (Signed)
[facility] NOTE   Pharmacy Consult for TPN Indication: prolonged ileus, small bowel leak, multiple bowel surgeries  Patient Measurements: Body mass index is 26.44 kg/m. Filed Weights   06/15/17 0403 06/17/17 0356 06/21/17 1014  Weight: 180 lb 8.9 oz (81.9 kg) 184 lb 4.9 oz (83.6 kg) 184 lb 4.9 oz (83.6 kg)   HPI: 13 yoM admitted on 7/11 for enlarging adrenal mass and planned adrenalectomy.  Pharmacy consulted to dose TPN.  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   7/22 OR:  wash out, tube ileostomy, left with open abdomen & wound vac in place.  Plan for OR on 7/25 for closure 7/25 OR: mesh placed for abdominal closure, wound vac 8/2 extubated, propofol drip off 8/4 TPN off for ~ 5 hours due to line detached from filter 8/10 MD request decrease rate x 1 week 2nd low sodium 8/12 Additional sodium added to TPN due to persistent hyponatremia 8/16 Increasing rate back to goal since sodium has improved with above intervention.   8/20 Prealbumin decreased.  New apparent enteric leak per surgery notes. 8/21 CaPhos product elevated, remove electrolytes from TPN 8/25 PICC did not have blood return in 2 of the 3 ports.  Alteplase intra-catheter and function returned.  TPN infusing correctly on 8/26 per RN.  Insulin requirements past 24 hours: 0 units SSI (no hx DM)  Current Nutrition: Clear Liquid Diet (8/21)  IVF: NS at 10 ml/hr  Central access: PICC line ordered 7/19, placed on 7/20 TPN start date: 7/20  ASSESSMENT                                                                                                            Today, 06/22/17  - Glucose (goal <150): CBGs at goal  -  Electrolytes: K 3.6, Mag 1.6, Na low but stable at 131 (sodium added to TPN equivalent to 0.45% NaCl), phos down to 4.6 (with lytes removed from TPN since 8/21), CorrCa wnl at 9.94 - Renal:  SCr wnl, stable. - LFTs: WNL except alkaline phos elevated but down to 172 - TGs:  296 (7/18), 266 (7/20), 269 (7/23), 210 (7/28), 285 (7/31) 301 (8/3), increased 323 (8/14), improved to 238 (8/20) - Prealbumin:  <5 (7/20), 6.1 (7/24), 10.1 (7/30) 24.8 (8/6), 26.4 (8/13), decreased to 15.9 (8/20)  NUTRITIONAL GOALS  RD recs (8/27):  122-138 gms Protein (1.5 - 1.7 g/kg) 2030 - 2270 Kcal  Recalculation: Clinimix 5/15 at rate of 115 ml/hr with lipids 3x/week on MWF only would provide: 138g protein and avg 2164 kcal/day  - Glucose infusion rate will be 3.53 mg/kg/min (Maximum 5 mg/kg/min)   PLAN                                                                                                                         Now:  - Magnesium 4 g IV x1 dose - KCl 10 mEq IV x4 runs  At 1800 today: ? Continue Clinimix 5/15 at 115 ml/hr.  ? NO ELECTROLYTES for now. Will plan on adding lytes back to TPN on 8/28 if Phosphorus continues to remain within goal range ? Add sodium to make equivalent to 0.45% NaCl (add 75 mEq of Na/L in a 1:1 KW:IOXBDZH ratio);  above standard Clinimix sodium concentration due to ongoing and recurrent hyponatremia ? Lipids at 59mls/hr x 12 hours 3 times weekly on MWF  TPN to contain standard multivitamins and trace elements daily  Maintenance IVF NS at 10 ml/hr  CBGs and moderate SSI q6h.  TPN lab panels on Mondays & Thursdays.  Check BMET, Mag & Phos daily   Dia Sitter, PharmD, BCPS 06/22/2017 7:14 AM

## 2017-06-22 NOTE — Progress Notes (Signed)
Assessment OPEN RIGHT ADRENALECTOMY WITH SMALL BOWEL RESECTION AND ANASTOMOSIS - 05/06/2017 - [name] - Additional surgery - 05/13/2017, 05/14/2017, 05/17/2017 and 05/20/2017 - Currently with distal ileum in discontinuity and catheter drainage of the 2 bowel ends. Now has new, low volume enteric leak from a separate anastomosis communicating to the anterior abdomen and the VAC dressing.  - Last CT scan on 08/14 - WBC 11.3, afebrile, sinus tachycardia - continue VACM, W, F HTN Malnutrition - On TPN; Prealbumin 19.7 (range 18-38) - allow sips of elemental fluids with NGT clamped Anemia - Hgb - 9.1 Chronic pain FEN: IVF, TPN, NGT, elemental liquids VTE: SCD's, lovenox ID: Zosyn- 06/07/2017 >06/16/17; florastor (8/22>>) Foley: none Follow up: TBD  DISPO: Continue current management. Encourage patient to get up and out of the bed  Plan:  VAC change today.  Continue bowel rest and TPN.     LOS: 47 days     33 Days Post-Op  Chief Complaint/Subjective: Still with nausea at times. Wife at bedside.  Some intermittent pain.  Objective: Vital signs in last 24 hours: Temp:  [97.7 F (36.5 C)-98.7 F (37.1 C)] 97.7 F (36.5 C) (08/26 2348) Pulse Rate:  [86-101] 96 (08/27 0600) Resp:  [16-23] 20 (08/27 0600) BP: (108-138)/(56-86) 113/76 (08/27 0600) SpO2:  [96 %-99 %] 97 % (08/27 0600) Weight:  [83.6 kg (184 lb 4.9 oz)] 83.6 kg (184 lb 4.9 oz) (08/26 1014) Last BM Date: 06/07/17  Intake/Output from previous day: 08/26 0701 - 08/27 0700 In: 2356.4 [I.V.:1486.4; NG/GT:190; IV Piggyback:680] Out: 2205 [Urine:1150; Emesis/NG output:370; Drains:685] Intake/Output this shift: No intake/output data recorded.  PE: General- In NAD.  Awake and alert. Abdomen-soft, VAC on right flank wound with small amount of enteric-like drainage, multiple drains present and draining  Lab Results:   Recent Labs  06/20/17 0302 06/22/17 0543  WBC 12.8* 11.3*  HGB 9.1* 9.1*  HCT 28.1*  28.1*  PLT 546* 586*   BMET  Recent Labs  06/21/17 0334 06/22/17 0543  NA 131* 131*  K 3.7 3.6  CL 98* 99*  CO2 25 24  GLUCOSE 109* 115*  BUN 22* 22*  CREATININE 0.65 0.64  CALCIUM 8.9 8.9   PT/INR No results for input(s): LABPROT, INR in the last 72 hours. Comprehensive Metabolic Panel:    Component Value Date/Time   NA 131 (L) 06/22/2017 0543   NA 131 (L) 06/21/2017 0334   K 3.6 06/22/2017 0543   K 3.7 06/21/2017 0334   CL 99 (L) 06/22/2017 0543   CL 98 (L) 06/21/2017 0334   CO2 24 06/22/2017 0543   CO2 25 06/21/2017 0334   BUN 22 (H) 06/22/2017 0543   BUN 22 (H) 06/21/2017 0334   CREATININE 0.64 06/22/2017 0543   CREATININE 0.65 06/21/2017 0334   CREATININE 0.85 07/22/2013 1021   GLUCOSE 115 (H) 06/22/2017 0543   GLUCOSE 109 (H) 06/21/2017 0334   CALCIUM 8.9 06/22/2017 0543   CALCIUM 8.9 06/21/2017 0334   AST 26 06/22/2017 0543   AST 25 06/18/2017 0407   ALT 25 06/22/2017 0543   ALT 24 06/18/2017 0407   ALKPHOS 172 (H) 06/22/2017 0543   ALKPHOS 192 (H) 06/18/2017 0407   BILITOT 0.8 06/22/2017 0543   BILITOT 0.9 06/18/2017 0407   PROT 7.0 06/22/2017 0543   PROT 7.4 06/18/2017 0407   ALBUMIN 2.7 (L) 06/22/2017 0543   ALBUMIN 2.7 (L) 06/18/2017 0407     Studies/Results: Ct Head Wo Contrast  Result Date: 06/20/2017 CLINICAL DATA:  Head  trauma EXAM: CT HEAD WITHOUT CONTRAST TECHNIQUE: Contiguous axial images were obtained from the base of the skull through the vertex without intravenous contrast. COMPARISON:  None. FINDINGS: Brain: No acute intracranial abnormality. Specifically, no hemorrhage, hydrocephalus, mass lesion, acute infarction, or significant intracranial injury. Vascular: No hyperdense vessel or unexpected calcification. Skull: No acute calvarial abnormality. Sinuses/Orbits: Visualized paranasal sinuses and mastoids clear. Orbital soft tissues unremarkable. Other: None IMPRESSION: No acute intracranial abnormality. Electronically Signed   By: Rolm Baptise M.D.   On: 06/20/2017 16:59    Anti-infectives: Anti-infectives    Start     Dose/Rate Route Frequency Ordered Stop   06/16/17 1200  piperacillin-tazobactam (ZOSYN) IVPB 3.375 g     3.375 g 12.5 mL/hr over 240 Minutes Intravenous Every 8 hours 06/16/17 1026 06/17/17 0004   06/08/17 1200  piperacillin-tazobactam (ZOSYN) IVPB 3.375 g  Status:  Discontinued     3.375 g 12.5 mL/hr over 240 Minutes Intravenous Every 8 hours 06/08/17 1056 06/16/17 1026   05/29/17 2200  ceFEPIme (MAXIPIME) 2 g in dextrose 5 % 50 mL IVPB  Status:  Discontinued     2 g 100 mL/hr over 30 Minutes Intravenous Every 8 hours 05/29/17 2031 06/03/17 0832   05/29/17 2100  metroNIDAZOLE (FLAGYL) IVPB 500 mg  Status:  Discontinued     500 mg 100 mL/hr over 60 Minutes Intravenous Every 8 hours 05/29/17 2029 06/03/17 0832   05/26/17 2200  ceFAZolin (ANCEF) IVPB 1 g/50 mL premix  Status:  Discontinued     1 g 100 mL/hr over 30 Minutes Intravenous Every 8 hours 05/26/17 1008 05/29/17 2023   05/25/17 1100  vancomycin (VANCOCIN) 1,250 mg in sodium chloride 0.9 % 250 mL IVPB  Status:  Discontinued     1,250 mg 166.7 mL/hr over 90 Minutes Intravenous Every 12 hours 05/25/17 0955 05/26/17 0946   05/22/17 1400  anidulafungin (ERAXIS) 100 mg in sodium chloride 0.9 % 100 mL IVPB     100 mg 78 mL/hr over 100 Minutes Intravenous Every 24 hours 05/21/17 1330 05/27/17 1740   05/21/17 1400  anidulafungin (ERAXIS) 200 mg in sodium chloride 0.9 % 200 mL IVPB     200 mg 78 mL/hr over 200 Minutes Intravenous  Once 05/21/17 1330 05/21/17 1940   05/15/17 1000  anidulafungin (ERAXIS) 100 mg in sodium chloride 0.9 % 100 mL IVPB  Status:  Discontinued     100 mg 78 mL/hr over 100 Minutes Intravenous Every 24 hours 05/14/17 0829 05/16/17 0901   05/15/17 1000  metroNIDAZOLE (FLAGYL) IVPB 500 mg  Status:  Discontinued     500 mg 100 mL/hr over 60 Minutes Intravenous Every 8 hours 05/15/17 0903 05/25/17 0938   05/14/17 0900  anidulafungin  (ERAXIS) 200 mg in sodium chloride 0.9 % 200 mL IVPB     200 mg 78 mL/hr over 200 Minutes Intravenous  Once 05/14/17 0829 05/14/17 1325   05/13/17 1927  vancomycin (VANCOCIN) 1-5 GM/200ML-% IVPB    Comments:  Ward, Christa   : cabinet override      05/13/17 1927 05/14/17 0744   05/12/17 1400  ceFEPIme (MAXIPIME) 1 g in dextrose 5 % 50 mL IVPB     1 g 100 mL/hr over 30 Minutes Intravenous Every 8 hours 05/12/17 0939 05/26/17 2359   05/12/17 0900  vancomycin (VANCOCIN) IVPB 1000 mg/200 mL premix  Status:  Discontinued     1,000 mg 200 mL/hr over 60 Minutes Intravenous Every 12 hours 05/12/17 0750 05/16/17 6761  05/12/17 0830  aztreonam (AZACTAM) 2 GM IVPB     2 g 100 mL/hr over 30 Minutes Intravenous  Once 05/12/17 0800 05/12/17 0957   05/06/17 0916  vancomycin (VANCOCIN) IVPB 1000 mg/200 mL premix     1,000 mg 200 mL/hr over 60 Minutes Intravenous On call to O.R. 05/06/17 0916 05/06/17 1229       Punam Broussard J 06/22/2017

## 2017-06-22 NOTE — Progress Notes (Addendum)
Nutrition Follow-up  DOCUMENTATION CODES:   Not applicable  INTERVENTION:  - Continue Boost Breeze BID and encourage consumption. - Will order 30 mL Prostat once/day, this supplement provides 100 kcal and 15 grams of protein. - Diet advancement as medically feasible. - Continue TPN regimen per Pharmacy.  NUTRITION DIAGNOSIS:   Inadequate oral intake related to inability to eat as evidenced by NPO status. -ongoing  GOAL:   Patient will meet greater than or equal to 90% of their needs -met with TPN regimen alone  MONITOR:   Supplement acceptance, Diet advancement, Labs, Weight trends, Skin, I & O's (TPN)  ASSESSMENT:   56 y.o. 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 On 7/18, he developed a leak therefore was taken back to OR for re-do of ex-lap and repair of leak.  Significant events: 7/11 PT:WSFKC adrenalectomy with complication of intestinal injury and small bowel resection with anastomosis  7/18 LE:XNTZGY of small bowel anastomotic leak, abdominal closure of wound dehisence 7/19 OR: reopening of recent laparotomy, lysis of adhesions, noted ischemic perforation of new loop of intestine, intact repair of previous anastomotic leak repair, intact previous anastomosis.  7/22 OR: wash out, tube ileostomy, left with open abdomen &wound vac in place. Plan for OR on 7/25 for closure 7/25 OR: mesh placed for abdominal closure, wound vac 8/2extubated, propofol drip off 8/4TPN off for ~ 5 hours due to line detached from filter. Ultrasound-guided aspiration By interventional radiology of small perihepatic fluid collection yielded only 1 mL of the fluid, sent for culture. NPO - mild coffee ground on NG but no active bleed 8/6patient improving. Off Precedex drip since 8/4. Dilaudid Drip being  changed over to PCA. Still with some coffee ground-looking material in NG tube.  8/10 hyponatremia and plan to decrease TPN from 3L/day to 2L/day x1 week 8/6fund to have fecal matter in wound vac 8/14increase in fecal matter presence in wound vac with no plan for surgery at this time 8/16TPN advanced to goal rate of 115 ml/hr with 20% ILE @ 20 mL/hr x12 hours on MWF 8/22: NGT clamped. RD to order Boost Breeze for patient to sip on; Mycostatin started for oral thrush.   8/27 Pt continues on CLD with Boost Breeze BID (pt has accepted it 4/10 times offered). Will trial Prostat mixed in beverage of choice. Weight yesterday exactly the same as weight 8/22 with no weight obtained between those dates or this AM. Dr. MEarlie Servernote from this AM states that pt's condition remains unchanged.   Spoke with Pharmacist who reports plan to continue current TPN regimen of Clinimix 5/15 @ 115 mL/hr with 20% ILE @ 20 mL/hr x12 hours on MWF as Phos trending down from yesterday but still on the high side of "normal range." Plan to re-assess tomorrow AM following lab draw.  Medications reviewed; 20 mg IV Pepcid BID, 20 mg IV Lasix/day, sliding scale Novolog, 4 g IV Mg sulfate x1 run yesterday and x1 run today, 5 mg IV Reglan TID, 5 mL Mycostatin TID, 40 mg IV Protonix BID, 10 mEq IV KCl x4 runs yesterday and x4 runs today, 250 mg Florastor BID, 100 mg IV thiamine/day.  Labs reviewed; CBG: 116 mg/dL this AM, Na: 131 mmol/L, Cl: 99 mmol/L, BUN: 22 mg/dL, Mg: 1.6 mg/dL, Phos: 4.6 mg/dL, Alk Phos elevated, triglycerides 203 mg/dL today.  8/23 - Pt drowsy during RD visit so discussion was had with his wife, who is at bedside.  - Pt had about 1/3 of a bowl of beef broth for lunch and again for dinner.  - She is unsure if pt experienced pain or nausea with intakes (Surgery PA note pt had reported some nausea with PO intakes).  - Boost Breeze was ordered BID yesterday but wife does not think that pt has tried  this item yet; she is very interested in him receiving it and consuming it.   Per Pharmacy note this AM, plan to continue Clinimix 5/15 @ 115 mL/hr with 20% ILE @ 20 mL/hr x12 hours MWF. Also adding sodium to TPN bags: add 75 mEq of Na/L in a 1:2 KS:[id number] ratio. TPN regimen is providing 2164 kcal and 138 grams of protein.  RD will continue to follow per protocol.    8/22 - RD consulted for recommendations regarding elemental liquids.  - Spoke with Claiborne Billings, [place], states she would like patient to have a supplement to sip on while NGT is clamped given his wound.  - RD to order Boost Breeze supplements for patient to try.  - Pt continues to receive Clinimix E 5/15 @ 115 mL/hr with 20% ILE @ 20 mL/hr x12 hours on MWF which provides a daily average of 2164 kcal and 138g protein .    Diet Order:  Diet clear liquid Room service appropriate? Yes; Fluid consistency: Thin TPN (CLINIMIX) Adult without lytes  Skin:  Wound (see comment) (Incisions to R flank and abdomen from 7/11 and 7/18)  Last BM:  8/12 via ileostomy  Height:   Ht Readings from Last 1 Encounters:  05/26/17 '5\' 10"'  (1.778 m)    Weight:   Wt Readings from Last 1 Encounters:  06/21/17 184 lb 4.9 oz (83.6 kg)    Ideal Body Weight:  75.45 kg  BMI:  Body mass index is 26.44 kg/m.  Estimated Nutritional Needs:   Kcal:  2030-2270 (25-28 kcal/kg)  Protein:  122-138 grams (1.5-1.7 grams/kg)  Fluid:  2 L/day  EDUCATION NEEDS:   No education needs identified at this time    Jarome Matin, MS, RD, LDN, CNSC Inpatient Clinical Dietitian Pager # [phone] After hours/weekend pager # [phone]

## 2017-06-22 NOTE — Progress Notes (Signed)
Offered to assist pt to [facility] or bathroom to evacuate bowels; pt refused. Encouraged to try; continued to refuse. Will offer again this afternoon.

## 2017-06-23 LAB — BASIC METABOLIC PANEL
ANION GAP: 9 (ref 5–15)
BUN: 23 mg/dL — ABNORMAL HIGH (ref 6–20)
CALCIUM: 8.8 mg/dL — AB (ref 8.9–10.3)
CHLORIDE: 101 mmol/L (ref 101–111)
CO2: 22 mmol/L (ref 22–32)
CREATININE: 0.57 mg/dL — AB (ref 0.61–1.24)
GFR calc non Af Amer: >60 mL/min (ref >60)
GLUCOSE: 113 mg/dL — AB (ref 65–99)
Potassium: 3.9 mmol/L (ref 3.5–5.1)
Sodium: 132 mmol/L — ABNORMAL LOW (ref 135–145)

## 2017-06-23 LAB — GLUCOSE, CAPILLARY
GLUCOSE-CAPILLARY: 105 mg/dL — AB (ref 65–99)
GLUCOSE-CAPILLARY: 106 mg/dL — AB (ref 65–99)
GLUCOSE-CAPILLARY: 129 mg/dL — AB (ref 65–99)
Glucose-Capillary: 115 mg/dL — ABNORMAL HIGH (ref 65–99)

## 2017-06-23 LAB — PHOSPHORUS: PHOSPHORUS: 4.8 mg/dL — AB (ref 2.5–4.6)

## 2017-06-23 LAB — MAGNESIUM: Magnesium: 1.5 mg/dL — ABNORMAL LOW (ref 1.7–2.4)

## 2017-06-23 MED ORDER — LORAZEPAM 2 MG/ML PO CONC: 2.0000 mg | Freq: Four times a day (QID) | ORAL | Status: DC | PRN

## 2017-06-23 MED ORDER — TRACE MINERALS CR-CU-MN-SE-ZN 10-1000-500-60 MCG/ML IV SOLN
INTRAVENOUS | Status: AC
  Administered 2017-06-23: 18:00:00 via INTRAVENOUS
  Filled 2017-06-23: qty 1000
  Filled 2017-06-23: qty 2760

## 2017-06-23 MED ORDER — MAGNESIUM SULFATE 4 GM/100ML IV SOLN
4.0000 g | Freq: Once | INTRAVENOUS | Status: AC
  Administered 2017-06-23: 4 g via INTRAVENOUS
  Filled 2017-06-23: qty 100

## 2017-06-23 NOTE — Progress Notes (Signed)
No significant changes from a medical standpoint, remains on IV lasix due to large amounts IV net fluid intake with TNA , PCA pump etc -would recommend continuing IV lasix daily until he is on TNA -getting electrolytes per pharmacy with TNA -we will sign off, dont have anything else to add medically to [name] [name]'s care -please call as needed  Domenic Polite, MD

## 2017-06-23 NOTE — Progress Notes (Addendum)
Occupational Therapy Treatment Patient Details Name: [name] [name] MRN: [id number] DOB: [birth year] Today's Date: 06/23/2017    History of present illness  56 y.o. 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 On 7/18, he developed a leak therefore was taken back to OR for re-do of ex-lap and repair of leak on 05/20/17.  He returned to the ICU on the vent . Extubated on 05/28/17.    OT comments  Pt making good progress with OT. Ambulated to bathroom x 2 with min  Guard +2 lines.  HR 105-141  Follow Up Recommendations  SNF    Equipment Recommendations  3 in 1 bedside commode    Recommendations for Other Services      Precautions / Restrictions Precautions Precautions: Fall Precaution Comments: multiple lines in abdomen, VAC, NG, and JP drain Restrictions Weight Bearing Restrictions: No       Mobility Bed Mobility     Rolling: Min guard Sidelying to sit: Min guard       General bed mobility comments: for safety/lines  Transfers   Equipment used: Rolling walker (2 wheeled)   Sit to Stand: Min guard;+2 safety/equipment;Min assist         General transfer comment: min guard from bed and recliner. Min A to control descent to toilet    Balance                                           ADL either performed or assessed with clinical judgement   ADL       Grooming: Wash/dry hands;Supervision/safety;Standing                   Toilet Transfer: Minimal assistance;+2 for safety/equipment;Regular Toilet;Grab bars;RW             General ADL Comments: ambulated to bathroom twice:  washed hands first time. After UE exercise, pt requested to try to go in and sit on commode.  He did not want 3:1 over this.       Vision       Perception     Praxis       Cognition Arousal/Alertness: Awake/alert Behavior During Therapy: WFL for tasks assessed/performed Overall Cognitive Status: Within Functional Limits for tasks assessed                                          Exercises Other Exercises Other Exercises: performed 2 sets of 10 AROM FF to 90; one set of abduction to 90 and 20 reps elbow flexion   Shoulder Instructions       General Comments      Pertinent Vitals/ Pain       Pain Score: 4  Pain Location: R side abdomen  Pain Descriptors / Indicators: Sore;Grimacing Pain Intervention(s): Limited activity within patient's tolerance;Monitored during session;Repositioned  Home Living                                          Prior Functioning/Environment  Frequency  Min 2X/week        Progress Toward Goals  OT Goals(current goals can now be found in the care plan section)  Progress towards OT goals: Progressing toward goals  Acute Rehab OT Goals Time For Goal Achievement: 06/26/17  Plan      Co-evaluation                 AM-PAC PT "6 Clicks" Daily Activity     Outcome Measure   Help from another person eating meals?: A Little Help from another person taking care of personal grooming?: A Little Help from another person toileting, which includes using toliet, bedpan, or urinal?: A Lot Help from another person bathing (including washing, rinsing, drying)?: A Lot Help from another person to put on and taking off regular upper body clothing?: A Lot Help from another person to put on and taking off regular lower body clothing?: Total 6 Click Score: 13    End of Session    OT Visit Diagnosis: Unsteadiness on feet (R26.81);Muscle weakness (generalized) (M62.81)   Activity Tolerance Patient tolerated treatment well   Patient Left  (on commode with NT)   Nurse Communication          Time: 6269-4854 OT Time Calculation (min): 43 min  Charges: OT General  Charges $OT Visit: 1 Visit OT Treatments $Self Care/Home Management : 23-37 mins $Therapeutic Exercise: 8-22 mins  [name] [name], OTR/L [phone] 06/23/2017   [name] [name] 06/23/2017, 1:56 PM

## 2017-06-23 NOTE — Progress Notes (Signed)
[facility] NOTE   Pharmacy Consult for TPN Indication: prolonged ileus, small bowel leak, multiple bowel surgeries  Patient Measurements: Body mass index is 26.44 kg/m. Filed Weights   06/15/17 0403 06/17/17 0356 06/21/17 1014  Weight: 180 lb 8.9 oz (81.9 kg) 184 lb 4.9 oz (83.6 kg) 184 lb 4.9 oz (83.6 kg)   HPI: 27 yoM admitted on 7/11 for enlarging adrenal mass and planned adrenalectomy.  Pharmacy consulted to dose TPN.  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   7/22 OR:  wash out, tube ileostomy, left with open abdomen & wound vac in place.  Plan for OR on 7/25 for closure 7/25 OR: mesh placed for abdominal closure, wound vac 8/2 extubated, propofol drip off 8/4 TPN off for ~ 5 hours due to line detached from filter 8/10 MD request decrease rate x 1 week 2nd low sodium 8/12 Additional sodium added to TPN due to persistent hyponatremia 8/16 Increasing rate back to goal since sodium has improved with above intervention.   8/20 Prealbumin decreased.  New apparent enteric leak per surgery notes. 8/21 CaPhos product elevated, remove electrolytes from TPN 8/25 PICC did not have blood return in 2 of the 3 ports.  Alteplase intra-catheter and function returned.  TPN infusing correctly on 8/26 per RN. 8/28: d/c SSI  Insulin requirements past 24 hours: 2 units from SSI in the past 24 hhrs (no hx DM)  Current Nutrition: Clear Liquid Diet (8/21) - boost bid started on 8/22 (pt didn't take any from 8/24-8/26) - pro-stat 30 mL daily started on 8/27  IVF: none  Central access: PICC  line ordered 7/19, placed on 7/20 TPN start date: 7/20  ASSESSMENT                                                                                                           Today, 06/23/17  - Glucose (goal <150): CBGs at goal  -  Electrolytes: K 3.9, Mag down to 1.5 despite 4 gm yesterday, Na low but up slightly to 132 (sodium added to TPN equivalent to 0.45% NaCl), phos up 4.8 (with lytes removed from TPN since 8/21), CorrCa wnl at 9.84; Ca/phos product <55 - Renal:  SCr wnl, stable. - LFTs: WNL except alkaline phos elevated but down to 172 - TGs:  296 (7/18), 266 (7/20), 269 (7/23), 210 (7/28), 285 (7/31) 301 (8/3), increased 323 (8/14), 238 (8/20), trending down to 203 (8/27) - Prealbumin:  <5 (7/20), 6.1 (7/24), 10.1 (7/30) 24.8 (8/6), 26.4 (8/13), 15.9 (8/20), 19.7 (8/27)  NUTRITIONAL GOALS  RD recs (8/27):  122-138 gms Protein (1.5 - 1.7 g/kg) 2030 - 2270 Kcal  Recalculation: Clinimix 5/15 at rate of 115 ml/hr with lipids 3x/week on MWF only would provide: 138g protein and avg 2164 kcal/day  - Glucose infusion rate will be 3.53 mg/kg/min (Maximum 5 mg/kg/min)   PLAN                                                                                                                         Now:  - Magnesium 4 g IV x1 dose  At 1800 today: ? Continue Clinimix 5/15 at 115 ml/hr.  ? NO ELECTROLYTES for now since Phosphorus is elevated ? Add sodium to make equivalent to 0.45% NaCl (add 75 mEq of Na/L in a 1:1 YJ:[id number] ratio);  above standard Clinimix sodium concentration due to ongoing and recurrent hyponatremia ? Lipids at 79mls/hr x 12 hours 3 times weekly on MWF  TPN to contain standard multivitamins and trace elements daily  With stable cbgs, will d/c SSI  TPN lab panels on Mondays & Thursdays.  Check BMET, Mag & Phos daily   Dia Sitter, PharmD, BCPS 06/23/2017 6:59 AM

## 2017-06-24 LAB — BASIC METABOLIC PANEL
ANION GAP: 8 (ref 5–15)
BUN: 23 mg/dL — ABNORMAL HIGH (ref 6–20)
CALCIUM: 8.9 mg/dL (ref 8.9–10.3)
CHLORIDE: 101 mmol/L (ref 101–111)
CO2: 24 mmol/L (ref 22–32)
Creatinine, Ser: 0.62 mg/dL (ref 0.61–1.24)
GFR calc non Af Amer: >60 mL/min (ref >60)
Glucose, Bld: 118 mg/dL — ABNORMAL HIGH (ref 65–99)
Potassium: 3.1 mmol/L — ABNORMAL LOW (ref 3.5–5.1)
Sodium: 133 mmol/L — ABNORMAL LOW (ref 135–145)

## 2017-06-24 LAB — GLUCOSE, CAPILLARY
GLUCOSE-CAPILLARY: 126 mg/dL — AB (ref 65–99)
Glucose-Capillary: 112 mg/dL — ABNORMAL HIGH (ref 65–99)

## 2017-06-24 LAB — PHOSPHORUS: PHOSPHORUS: 4.2 mg/dL (ref 2.5–4.6)

## 2017-06-24 LAB — MAGNESIUM: Magnesium: 1.5 mg/dL — ABNORMAL LOW (ref 1.7–2.4)

## 2017-06-24 MED ORDER — MAGNESIUM SULFATE 4 GM/100ML IV SOLN
4.0000 g | Freq: Once | INTRAVENOUS | Status: AC
  Administered 2017-06-24: 4 g via INTRAVENOUS
  Filled 2017-06-24: qty 100

## 2017-06-24 MED ORDER — POTASSIUM CHLORIDE 10 MEQ/100ML IV SOLN
10.0000 meq | INTRAVENOUS | Status: AC
  Administered 2017-06-24 (×4): 10 meq via INTRAVENOUS
  Filled 2017-06-24 (×4): qty 100

## 2017-06-24 MED ORDER — TRACE MINERALS CR-CU-MN-SE-ZN 10-1000-500-60 MCG/ML IV SOLN
INTRAVENOUS | Status: AC
  Administered 2017-06-24: 18:00:00 via INTRAVENOUS
  Filled 2017-06-24: qty 2000
  Filled 2017-06-24: qty 2760

## 2017-06-24 MED ORDER — FAT EMULSION 20 % IV EMUL
240.0000 mL | INTRAVENOUS | Status: AC
  Administered 2017-06-24: 240 mL via INTRAVENOUS
  Filled 2017-06-24: qty 250

## 2017-06-24 NOTE — Progress Notes (Signed)
Nutrition Follow-up  DOCUMENTATION CODES:   Not applicable  INTERVENTION:  - Continue TPN per Pharmacy. - Continue to encourage PO intakes of CLD and supplements. - Continue Boost Breeze BID and 30 mL Prostat once/day.   NUTRITION DIAGNOSIS:   Inadequate oral intake related to inability to eat as evidenced by NPO status. -pt on CLD with minimal intakes.   GOAL:   Patient will meet greater than or equal to 90% of their needs -met with TPN regimen.  MONITOR:   Supplement acceptance, Diet advancement, Labs, Weight trends, Skin, I & O's (TPN)  ASSESSMENT:   56 y.o. 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 On 7/18, he developed a leak therefore was taken back to OR for re-do of ex-lap and repair of leak.  Significant events: 7/11 XB:JYNWG adrenalectomy with complication of intestinal injury and small bowel resection with anastomosis  7/18 NF:[id number] of small bowel anastomotic leak, abdominal closure of wound dehisence 7/19 OR: reopening of recent laparotomy, lysis of adhesions, noted ischemic perforation of new loop of intestine, intact repair of previous anastomotic leak repair, intact previous anastomosis.  7/22 OR: wash out, tube ileostomy, left with open abdomen &wound vac in place. Plan for OR on 7/25 for closure 7/25 OR: mesh placed for abdominal closure, wound vac 8/2extubated, propofol drip off 8/4TPN off for ~ 5 hours due to line detached from filter. Ultrasound-guided aspiration By interventional radiology of small perihepatic fluid collection yielded only 1 mL of the fluid, sent for culture. NPO - mild coffee ground on NG but no active bleed 8/6patient improving. Off Precedex drip since 8/4. Dilaudid Drip being changed over to PCA. Still with some coffee ground-looking material in  NG tube.  8/10 hyponatremia and plan to decrease TPN from 3L/day to 2L/day x1 week 8/66fund to have fecal matter in wound vac 8/14increase in fecal matter presence in wound vac with no plan for surgery at this time 8/16TPN advanced to goal rate of 115 ml/hr with 20% ILE @ 20 mL/hr x12 hours on MWF 8/22: NGT clamped. RD to order Boost Breeze for patient to sip on; Mycostatin started for oral thrush.   8/29 Pt continues on CLD with Boost Breeze BID (accepting ~50% of the time) and Prostat once/day (has accepted both times it was offered). Spoke with pt earlier this AM and he reported that he had drank most of a Boost Breeze, no other liquids at that time. He reported plan to try a few sips of tea and to eat a jello later in the AM. Last night he had a few sips of tea and a few spoonfuls of beef broth and did not experience abdominal pain or nausea with these items. Talked with pt about oral nutrition supplements and continued to encourage him to consume these items and the ability to use Boost Breeze to take medications. Encouraged sips throughout the day of any items on CLD. No new weight since 8/26.  Spoke with Pharmacist earlier this AM concerning TPN regimen. Plan to maintain electrolyte-free formula today and re-assess tomorrow AM; hopefully Phos will drop a little lower. Pharmacist would like to wait until pt is transferred to the floor before transitioning to cyclic TPN. When that occurs recommend: Clinimix 5/15/Clinimix E 5/15 @ 50 mL/hr x1 hour, @ 190 mL/hr x14 hours, @ 50 mL/hr x1 hour (16 hours on,  8 hours off; GIR for 190 mL/hr: 1.9). Pharmacist also reported that she d/c'ed sliding scale Novolog and QID CBGs.   Medications reviewed; 20 mg IV Pepcid BID, 20 mg IV Lasix/day, 4 g Mg sulfate x1 run yesterday and x1 run today, 5 mg IV Reglan TID, 5 mL Mycostatin TID, 40 mg IV Protonix BID, 10 mEq IV KCl x6 runs today, 250 mg Florastor BID, 100 mg IV thiamine/day.  Labs reviewed; CBGs:  105-129 mg/dL yesterday, Na: 133 mmol/L, K: 3.1 mmol/L, Mg: 1.5 mg/dL, BUN: 23 mg/dL.    8/27 - Boost Breeze BID (pt has accepted it 4/10 times offered).  - Will trial Prostat mixed in beverage of choice.  - Weight yesterday exactly the same as weight 8/22 with no weight obtained between those dates or this AM.  - Dr. Earlie Server note from this AM states that pt's condition remains unchanged.  - Spoke with Pharmacist who reports plan to continue current TPN regimen of Clinimix 5/15 @ 115 mL/hr with 20% ILE @ 20 mL/hr x12 hours on MWF as Phos trending down from yesterday but still on the high side of "normal range."   Mg: 1.6 mg/dL, Phos: 4.6 mg/dL, Alk Phos elevated, triglycerides 203 mg/dL today.     8/23 - Pt drowsy during RD visit so discussion was had with his wife, who is at bedside.  - Pt had about 1/3 of a bowl of beef broth for lunch and again for dinner.  - She is unsure if pt experienced pain or nausea with intakes (Surgery PA note pt had reported some nausea with PO intakes).  - Boost Breeze was ordered BID yesterday but wife does not think that pt has tried this item yet; she is very interested in him receiving it and consuming it.   Per Pharmacy note this AM, plan to continue Clinimix 5/15 @ 115 mL/hr with 20% ILE @ 20 mL/hr x12 hours MWF. Also adding sodium to TPN bags: add 75 mEq of Na/L in a 1:2 WT:[id number] ratio. TPN regimen is providing 2164 kcal and 138 grams of protein.     Diet Order:  Diet clear liquid Room service appropriate? Yes; Fluid consistency: Thin TPN (CLINIMIX) Adult without lytes  Skin:  Abdomen and R flank incision sites with wound vac to abdomen  Last BM:  8/12 via ileostomy  Height:   Ht Readings from Last 1 Encounters:  05/26/17 '5\' 10"'  (1.778 m)    Weight:   Wt Readings from Last 1 Encounters:  06/21/17 184 lb 4.9 oz (83.6 kg)    Ideal Body Weight:  75.45 kg  BMI:  Body mass index is 26.44 kg/m.  Estimated Nutritional Needs:    Kcal:  2030-2270 (25-28 kcal/kg)  Protein:  122-138 grams (1.5-1.7 grams/kg)  Fluid:  2 L/day  EDUCATION NEEDS:   No education needs identified at this time     Jarome Matin, MS, RD, LDN, CNSC Inpatient Clinical Dietitian Pager # [phone] After hours/weekend pager # [phone]

## 2017-06-24 NOTE — Consult Note (Signed)
[facility] Nurse wound consult note Reason for Consult: Routine change of NPWT device/dressing. I am assisted in the dressing change by Bedside RN.  CC[facility] PA Brooke Meuth in for wound assessment Wound type:Surgical Pressure Injury POA: N/A Measurement: per Monday Wound bed: 75% red, moist and granulating.  25% of wound bed with brown/feculent material. Drainage (amount, consistency, odor) VAC cannister is 80% full today and it will be changed shortly.  There is a pronounced odor in room today that is possibly linked to the cannister. Periwound: Intact, closed edges. Dressing procedure/placement/frequency: NPWT dressing removed using adhesive releaser product. One piece of black foam removed, 1 piece of white foam removed. Wound cleansed with NS prior to redressing.  Photo taken by [facility] PA Meuth.  One piece of white foam used to cover 80% of wound bed. Two pieces of black foam used to obliterate and fully cover wound bed and white foam.  Drape applied and TRAC pad applied. Negative pressure initiated at 136mmHg and an immediate seal achieved. Dressing labeled.  Patient tolerated procedure well; used PCA pump x 3 during dressing change. Patient is planning on getting OOB to chair in a short time with PT.  I will provide a pressure redistribution chair pad for OOB use. [place] nursing team will continue to follow, and will remain available to this patient, the nursing, srugical and medical teams.   Thanks, Maudie Flakes, MSN, RN, [place], Arther Abbott  Pager# [phone]

## 2017-06-24 NOTE — Progress Notes (Signed)
[facility] NOTE   Pharmacy Consult for TPN Indication: prolonged ileus, small bowel leak, multiple bowel surgeries  Patient Measurements: Body mass index is 26.44 kg/m. Filed Weights   06/15/17 0403 06/17/17 0356 06/21/17 1014  Weight: 180 lb 8.9 oz (81.9 kg) 184 lb 4.9 oz (83.6 kg) 184 lb 4.9 oz (83.6 kg)   HPI: 28 yoM admitted on 7/11 for enlarging adrenal mass and planned adrenalectomy.  Pharmacy consulted to dose TPN.  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   7/22 OR:  wash out, tube ileostomy, left with open abdomen & wound vac in place.  Plan for OR on 7/25 for closure 7/25 OR: mesh placed for abdominal closure, wound vac 8/2 extubated, propofol drip off 8/4 TPN off for ~ 5 hours due to line detached from filter 8/10 MD request decrease rate x 1 week 2nd low sodium 8/12 Additional sodium added to TPN due to persistent hyponatremia 8/16 Increasing rate back to goal since sodium has improved with above intervention.   8/20 Prealbumin decreased.  New apparent enteric leak per surgery notes. 8/21 CaPhos product elevated, remove electrolytes from TPN 8/25 PICC did not have blood return in 2 of the 3 ports.  Alteplase intra-catheter and function returned.  TPN infusing correctly on 8/26 per RN. 8/28: d/c SSI  Insulin requirements past 24 hours: not on insulin (no hx DM)  Current Nutrition: Clear Liquid Diet (8/21) - boost bid started on 8/22 (pt didn't take any from 8/24-8/26) - pro-stat 30 mL daily started on 8/27  IVF: none  Central access: PICC line ordered 7/19,  placed on 7/20 TPN start date: 7/20  ASSESSMENT                                                                                                           Today, 06/24/17  - Glucose (goal <150): CBGs at goal  -  Electrolytes: K low at 3.1, Mag remains at 1.5 despite 4 gm yesterday, Na low but trending up 133 (sodium added to TPN equivalent to 0.45% NaCl), phos down 4.2 (with lytes removed from TPN since 8/21), CorrCa wnl at 9.84; Ca/phos product <55 - Renal:  SCr wnl, stable. - LFTs: WNL except alkaline phos elevated but down to 172 - TGs:  296 (7/18), 266 (7/20), 269 (7/23), 210 (7/28), 285 (7/31) 301 (8/3), increased 323 (8/14), 238 (8/20), trending down to 203 (8/27) - Prealbumin:  <5 (7/20), 6.1 (7/24), 10.1 (7/30) 24.8 (8/6), 26.4 (8/13), 15.9 (8/20), 19.7 (8/27)  NUTRITIONAL GOALS  RD recs (8/27):  122-138 gms Protein (1.5 - 1.7 g/kg) 2030 - 2270 Kcal  Recalculation: Clinimix 5/15 at rate of 115 ml/hr with lipids 3x/week on MWF only would provide: 138g protein and avg 2164 kcal/day  - Glucose infusion rate will be 3.53 mg/kg/min (Maximum 5 mg/kg/min)   PLAN                                                                                                                         Now:  - Magnesium 4 g IV x1 dose - potassium chloride 10 mEq IV x 6 runs  At 1800 today: ? Continue Clinimix 5/15 at 115 ml/hr.  ? With phosphorus trended up in the past when Clinimix switched to the one with electrolytes, will continue with NO ELECTROLYTES formulation today.  If Phosphorus level remains stable and at goal tomorrow, will plan on changing clinimix to the E formulation on 8/30.  ? Add sodium to make equivalent to 0.45% NaCl (add 75 mEq of Na/L in a 1:1 VE:HMCNOBS ratio);  above standard Clinimix sodium concentration due to ongoing and recurrent hyponatremia.  If to change to Clinimix E on 8/30, Will not add  additional sodium to TPN bag. ? Lipids at 69mls/hr x 12 hours 3 times weekly on MWF  TPN to contain standard multivitamins and trace elements daily  TPN lab panels on Mondays & Thursdays.  Check BMET, Mag & Phos daily   Dia Sitter, PharmD, BCPS 06/24/2017 7:11 AM

## 2017-06-24 NOTE — Progress Notes (Signed)
Patient ID: [name] [name], male   DOB: 28-Feb-1961, 56 y.o.   MRN: [id number]

## 2017-06-24 NOTE — Progress Notes (Signed)
Patient ID: [name] [name], male   DOB: 1961-06-02, 56 y.o.   MRN: [id number] [name] Surgery Progress Note:   35 Days Post-Op  Subjective: Mental status is clear. Had BM.  Feels better since I cut the sutures holding the NG in his nose yesterday.   Objective: Vital signs in last 24 hours: Temp:  [97.5 F (36.4 C)-99.2 F (37.3 C)] 97.5 F (36.4 C) (08/29 0800) Pulse Rate:  [88-104] 97 (08/29 0500) Resp:  [16-20] 17 (08/29 0742) BP: (108-142)/(68-86) 114/72 (08/29 0400) SpO2:  [96 %-100 %] 99 % (08/29 0742)  Intake/Output from previous day: 08/28 0701 - 08/29 0700 In: 2861 [I.V.:2301; NG/GT:190; IV Piggyback:370] Out: 2495 [Urine:1975; Emesis/NG output:250; Drains:270] Intake/Output this shift: No intake/output data recorded.  Physical Exam: Work of breathing is not labored.  VAC in place.  No drainage from distal Foley  Lab Results:  Results for orders placed or performed during the hospital encounter of 05/06/17 (from the past 48 hour(s))  Glucose, capillary     Status: Abnormal   Collection Time: 06/22/17 11:39 AM  Result Value Ref Range   Glucose-Capillary 128 (H) 65 - 99 mg/dL  Glucose, capillary     Status: Abnormal   Collection Time: 06/22/17  5:25 PM  Result Value Ref Range   Glucose-Capillary 118 (H) 65 - 99 mg/dL  Glucose, capillary     Status: Abnormal   Collection Time: 06/23/17  1:01 AM  Result Value Ref Range   Glucose-Capillary 105 (H) 65 - 99 mg/dL  Basic metabolic panel     Status: Abnormal   Collection Time: 06/23/17  3:18 AM  Result Value Ref Range   Sodium 132 (L) 135 - 145 mmol/L   Potassium 3.9 3.5 - 5.1 mmol/L   Chloride 101 101 - 111 mmol/L   CO2 22 22 - 32 mmol/L   Glucose, Bld 113 (H) 65 - 99 mg/dL   BUN 23 (H) 6 - 20 mg/dL   Creatinine, Ser 0.57 (L) 0.61 - 1.24 mg/dL   Calcium 8.8 (L) 8.9 - 10.3 mg/dL   GFR calc non Af Amer >60 >60 mL/min   GFR calc Af Amer >60 >60 mL/min    Comment: (NOTE) The eGFR has been calculated using the  CKD EPI equation. This calculation has not been validated in all clinical situations. eGFR's persistently <60 mL/min signify possible Chronic Kidney Disease.    Anion gap 9 5 - 15  Magnesium     Status: Abnormal   Collection Time: 06/23/17  3:18 AM  Result Value Ref Range   Magnesium 1.5 (L) 1.7 - 2.4 mg/dL  Phosphorus     Status: Abnormal   Collection Time: 06/23/17  3:18 AM  Result Value Ref Range   Phosphorus 4.8 (H) 2.5 - 4.6 mg/dL  Glucose, capillary     Status: Abnormal   Collection Time: 06/23/17 11:38 AM  Result Value Ref Range   Glucose-Capillary 115 (H) 65 - 99 mg/dL  Glucose, capillary     Status: Abnormal   Collection Time: 06/23/17  6:01 PM  Result Value Ref Range   Glucose-Capillary 106 (H) 65 - 99 mg/dL  Glucose, capillary     Status: Abnormal   Collection Time: 06/23/17 11:24 PM  Result Value Ref Range   Glucose-Capillary 129 (H) 65 - 99 mg/dL  Basic metabolic panel     Status: Abnormal   Collection Time: 06/24/17  6:08 AM  Result Value Ref Range   Sodium 133 (L) 135 - 145  mmol/L   Potassium 3.1 (L) 3.5 - 5.1 mmol/L    Comment: DELTA CHECK NOTED REPEATED TO VERIFY    Chloride 101 101 - 111 mmol/L   CO2 24 22 - 32 mmol/L   Glucose, Bld 118 (H) 65 - 99 mg/dL   BUN 23 (H) 6 - 20 mg/dL   Creatinine, Ser 0.62 0.61 - 1.24 mg/dL   Calcium 8.9 8.9 - 10.3 mg/dL   GFR calc non Af Amer >60 >60 mL/min   GFR calc Af Amer >60 >60 mL/min    Comment: (NOTE) The eGFR has been calculated using the CKD EPI equation. This calculation has not been validated in all clinical situations. eGFR's persistently <60 mL/min signify possible Chronic Kidney Disease.    Anion gap 8 5 - 15  Magnesium     Status: Abnormal   Collection Time: 06/24/17  6:08 AM  Result Value Ref Range   Magnesium 1.5 (L) 1.7 - 2.4 mg/dL  Phosphorus     Status: None   Collection Time: 06/24/17  6:08 AM  Result Value Ref Range   Phosphorus 4.2 2.5 - 4.6 mg/dL    Radiology/Results: No results  found.  Anti-infectives: Anti-infectives    Start     Dose/Rate Route Frequency Ordered Stop   06/16/17 1200  piperacillin-tazobactam (ZOSYN) IVPB 3.375 g     3.375 g 12.5 mL/hr over 240 Minutes Intravenous Every 8 hours 06/16/17 1026 06/17/17 0004   06/08/17 1200  piperacillin-tazobactam (ZOSYN) IVPB 3.375 g  Status:  Discontinued     3.375 g 12.5 mL/hr over 240 Minutes Intravenous Every 8 hours 06/08/17 1056 06/16/17 1026   05/29/17 2200  ceFEPIme (MAXIPIME) 2 g in dextrose 5 % 50 mL IVPB  Status:  Discontinued     2 g 100 mL/hr over 30 Minutes Intravenous Every 8 hours 05/29/17 2031 06/03/17 0832   05/29/17 2100  metroNIDAZOLE (FLAGYL) IVPB 500 mg  Status:  Discontinued     500 mg 100 mL/hr over 60 Minutes Intravenous Every 8 hours 05/29/17 2029 06/03/17 0832   05/26/17 2200  ceFAZolin (ANCEF) IVPB 1 g/50 mL premix  Status:  Discontinued     1 g 100 mL/hr over 30 Minutes Intravenous Every 8 hours 05/26/17 1008 05/29/17 2023   05/25/17 1100  vancomycin (VANCOCIN) 1,250 mg in sodium chloride 0.9 % 250 mL IVPB  Status:  Discontinued     1,250 mg 166.7 mL/hr over 90 Minutes Intravenous Every 12 hours 05/25/17 0955 05/26/17 0946   05/22/17 1400  anidulafungin (ERAXIS) 100 mg in sodium chloride 0.9 % 100 mL IVPB     100 mg 78 mL/hr over 100 Minutes Intravenous Every 24 hours 05/21/17 1330 05/27/17 1740   05/21/17 1400  anidulafungin (ERAXIS) 200 mg in sodium chloride 0.9 % 200 mL IVPB     200 mg 78 mL/hr over 200 Minutes Intravenous  Once 05/21/17 1330 05/21/17 1940   05/15/17 1000  anidulafungin (ERAXIS) 100 mg in sodium chloride 0.9 % 100 mL IVPB  Status:  Discontinued     100 mg 78 mL/hr over 100 Minutes Intravenous Every 24 hours 05/14/17 0829 05/16/17 0901   05/15/17 1000  metroNIDAZOLE (FLAGYL) IVPB 500 mg  Status:  Discontinued     500 mg 100 mL/hr over 60 Minutes Intravenous Every 8 hours 05/15/17 0903 05/25/17 0938   05/14/17 0900  anidulafungin (ERAXIS) 200 mg in sodium  chloride 0.9 % 200 mL IVPB     200 mg 78 mL/hr over 200 Minutes  Intravenous  Once 05/14/17 0829 05/14/17 1325   05/13/17 1927  vancomycin (VANCOCIN) 1-5 GM/200ML-% IVPB    Comments:  Ward, Christa   : cabinet override      05/13/17 1927 05/14/17 0744   05/12/17 1400  ceFEPIme (MAXIPIME) 1 g in dextrose 5 % 50 mL IVPB     1 g 100 mL/hr over 30 Minutes Intravenous Every 8 hours 05/12/17 0939 05/26/17 2359   05/12/17 0900  vancomycin (VANCOCIN) IVPB 1000 mg/200 mL premix  Status:  Discontinued     1,000 mg 200 mL/hr over 60 Minutes Intravenous Every 12 hours 05/12/17 0750 05/16/17 0852   05/12/17 0830  aztreonam (AZACTAM) 2 GM IVPB     2 g 100 mL/hr over 30 Minutes Intravenous  Once 05/12/17 0800 05/12/17 0957   05/06/17 0916  vancomycin (VANCOCIN) IVPB 1000 mg/200 mL premix     1,000 mg 200 mL/hr over 60 Minutes Intravenous On call to O.R. 05/06/17 0916 05/06/17 1229      Assessment/Plan: Problem List: Patient Active Problem List   Diagnosis Date Noted  . Peritonitis ([place]) 06/01/2017  . Pressure injury of skin 05/29/2017  . Mucus plugging of bronchi   . Acute respiratory failure with hypoxemia ([place])   . Ileus ([place])   . Hypokalemia 05/14/2017  . Anastomotic leak of intestine 05/14/2017  . Severe sepsis ([place]) 05/14/2017  . Wound dehiscence, surgical 05/14/2017  . Aspiration pneumonia ([place]) 05/14/2017  . Chronic narcotic use 05/10/2017  . Anxiety state 05/10/2017  . Intra-abdominal adhesions s/p SB resection 05/06/2017 05/09/2017  . H/O total adrenalectomy ([place]) 05/06/2017  . Right adrenal mass s/p adrenalectomy 05/06/2017 05/06/2017  . Throat symptom 03/18/2016  . Multinodular goiter 01/19/2016  . Hyperthyroidism 01/19/2016  . Essential hypertension   . Diarrhea 11/30/2014  . Anemia, iron deficiency 11/01/2014  . Leukocytosis 11/03/2013  . Tobacco abuse 07/26/2013  . COPD (chronic obstructive pulmonary disease) ([place]) 07/26/2013  . Left knee pain 07/26/2013  . Pain in  joint, shoulder region 05/03/2013  . GERD 07/24/2008  . 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  LTAC not preferred by me.   35 Days Post-Op    LOS: 49 days   Matt B. Hassell Done, MD, [facility] [phone] beeper [phone]  06/24/2017 9:32 AM

## 2017-06-24 NOTE — Progress Notes (Signed)
PT Cancellation Note  Patient Details Name: [name] [name] MRN: [id number] DOB: 03/16/1961   Cancelled Treatment:     PT attempted x 2 this date.  Pt refused x 2 c/o fatigue.  Will follow.   Zanaya Baize 06/24/2017, 2:40 PM

## 2017-06-25 LAB — COMPREHENSIVE METABOLIC PANEL
ALT: 24 U/L (ref 17–63)
ANION GAP: 9 (ref 5–15)
AST: 24 U/L (ref 15–41)
Albumin: 2.6 g/dL — ABNORMAL LOW (ref 3.5–5.0)
Alkaline Phosphatase: 180 U/L — ABNORMAL HIGH (ref 38–126)
BILIRUBIN TOTAL: 0.6 mg/dL (ref 0.3–1.2)
BUN: 20 mg/dL (ref 6–20)
CO2: 24 mmol/L (ref 22–32)
Calcium: 9 mg/dL (ref 8.9–10.3)
Chloride: 99 mmol/L — ABNORMAL LOW (ref 101–111)
Creatinine, Ser: 0.59 mg/dL — ABNORMAL LOW (ref 0.61–1.24)
Glucose, Bld: 129 mg/dL — ABNORMAL HIGH (ref 65–99)
Potassium: 3.2 mmol/L — ABNORMAL LOW (ref 3.5–5.1)
Sodium: 132 mmol/L — ABNORMAL LOW (ref 135–145)
TOTAL PROTEIN: 7 g/dL (ref 6.5–8.1)

## 2017-06-25 LAB — PHOSPHORUS: Phosphorus: 4.3 mg/dL (ref 2.5–4.6)

## 2017-06-25 LAB — MAGNESIUM: MAGNESIUM: 1.3 mg/dL — AB (ref 1.7–2.4)

## 2017-06-25 MED ORDER — POTASSIUM CHLORIDE 10 MEQ/100ML IV SOLN
10.0000 meq | INTRAVENOUS | Status: AC
  Administered 2017-06-25 (×6): 10 meq via INTRAVENOUS
  Filled 2017-06-25 (×5): qty 100

## 2017-06-25 MED ORDER — TRACE MINERALS CR-CU-MN-SE-ZN 10-1000-500-60 MCG/ML IV SOLN
INTRAVENOUS | Status: DC
  Administered 2017-06-25: 18:00:00 via INTRAVENOUS
  Filled 2017-06-25: qty 2760

## 2017-06-25 MED ORDER — MAGNESIUM SULFATE 4 GM/100ML IV SOLN
4.0000 g | Freq: Once | INTRAVENOUS | Status: AC
  Administered 2017-06-25: 4 g via INTRAVENOUS
  Filled 2017-06-25 (×2): qty 100

## 2017-06-25 NOTE — Progress Notes (Signed)
[place] NOTE   Pharmacy Consult for TPN Indication: prolonged ileus, small bowel leak, multiple bowel surgeries  Patient Measurements: Body mass index is 26.41 kg/m. Filed Weights   06/21/17 1014 06/25/17 0428 06/25/17 1110  Weight: 184 lb 4.9 oz (83.6 kg) 184 lb 1.4 oz (83.5 kg) 184 lb 1.4 oz (83.5 kg)   HPI: 45 yoM admitted on 7/11 for enlarging adrenal mass and planned adrenalectomy.  Pharmacy consulted to dose TPN.  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   7/22 OR:  wash out, tube ileostomy, left with open abdomen & wound vac in place.  Plan for OR on 7/25 for closure 7/25 OR: mesh placed for abdominal closure, wound vac 8/2 extubated, propofol drip off 8/4 TPN off for ~ 5 hours due to line detached from filter 8/10 MD request decrease rate x 1 week 2nd low sodium 8/12 Additional sodium added to TPN due to persistent hyponatremia 8/16 Increasing rate back to goal since sodium has improved with above intervention.   8/20 Prealbumin decreased.  New apparent enteric leak per surgery notes. 8/21 CaPhos product elevated, remove electrolytes from TPN 8/25 PICC did not have blood return in 2 of the 3 ports.  Alteplase intra-catheter and function returned.  TPN infusing correctly on 8/26 per RN. 8/28: d/c SSI  Insulin requirements past 24 hours: not on insulin (no hx DM)  Current Nutrition: Clear Liquid Diet (8/21) - boost bid started on 8/22 (pt didn't take any from 8/24-8/26) - pro-stat 30 mL daily started on 8/27  IVF: none  Central access: PICC line ordered 7/19,  placed on 7/20 TPN start date: 7/20  ASSESSMENT                                                                                                           Today, 06/25/17  - Glucose (goal <150): at goal -  Electrolytes: K low at 3.2, Mag low at 1.3, Na low but stable at 132 (sodium added to TPN equivalent to 0.45% NaCl), phos stable at 4.2 (with lytes removed from TPN since 8/21), CorrCa wnl at 10.12; Ca/phos product <55 - Renal:  SCr wnl, stable. - LFTs: WNL except alkaline phos elevated but down to 172 - TGs:  296 (7/18), 266 (7/20), 269 (7/23), 210 (7/28), 285 (7/31) 301 (8/3), increased 323 (8/14), 238 (8/20), trending down to 203 (8/27) - Prealbumin:  <5 (7/20), 6.1 (7/24), 10.1 (7/30) 24.8 (8/6), 26.4 (8/13), 15.9 (8/20), 19.7 (8/27)  NUTRITIONAL GOALS  RD recs (8/27):  122-138 gms Protein (1.5 - 1.7 g/kg) 2030 - 2270 Kcal  Recalculation: Clinimix 5/15 at rate of 115 ml/hr with lipids 3x/week on MWF only would provide: 138g protein and avg 2164 kcal/day  - Glucose infusion rate will be 3.53 mg/kg/min (Maximum 5 mg/kg/min)   PLAN                                                                                                                         Now:  - Magnesium 4 g IV x1 dose - potassium chloride 10 mEq IV x 6 runs  At 1800 today: ? Continue Clinimix 5/15 at 115 ml/hr. Add electrolytes to TPN bag.   ? Lipids at 109mls/hr x 12 hours 3 times weekly on MWF  TPN to contain standard multivitamins and trace elements daily  TPN lab panels on Mondays & Thursdays.  Check BMET, Mag & Phos daily  Netta Cedars, PharmD, BCPS Pager: [phone] 06/25/2017 1:16 PM

## 2017-06-25 NOTE — Progress Notes (Signed)
PT Cancellation Note  Patient Details Name: [name] [name] MRN: [id number] DOB: 1961/03/02   Cancelled Treatment:     pt too fatigued to participate.  Will check back later as schedule permits.   [name]  PTA [facility]  Acute  Rehab Pager      [phone]

## 2017-06-25 NOTE — Care Management Note (Signed)
Case Management Note  Patient Details  Name: [name] [name] MRN: [id number] Date of Birth: October 15, 1961  Subjective/Objective:     woiund vac, surgical drain remaons in place, npo, iv tpn, ng tube               Action/Plan: Date:  June 25, 2017 Chart reviewed for concurrent status and case management needs. Will continue to follow patient progress. Discharge Planning: following for needs Expected discharge date: [id number] [name], BSN, [place], [place]  Expected Discharge Date:                  Expected Discharge Plan:  Home/Self Care  In-House Referral:     Discharge planning Services  CM Consult  Post Acute Care Choice:    Choice offered to:     DME Arranged:    DME Agency:     HH Arranged:    HH Agency:     Status of Service:  In process, will continue to follow  If discussed at Long Length of Stay Meetings, dates discussed:    Additional Comments:  Leeroy Cha, RN 06/25/2017, 9:04 AM

## 2017-06-26 LAB — BASIC METABOLIC PANEL
ANION GAP: 10 (ref 5–15)
BUN: 19 mg/dL (ref 6–20)
CO2: 25 mmol/L (ref 22–32)
Calcium: 9.2 mg/dL (ref 8.9–10.3)
Chloride: 99 mmol/L — ABNORMAL LOW (ref 101–111)
Creatinine, Ser: 0.67 mg/dL (ref 0.61–1.24)
Glucose, Bld: 93 mg/dL (ref 65–99)
POTASSIUM: 3.5 mmol/L (ref 3.5–5.1)
SODIUM: 134 mmol/L — AB (ref 135–145)

## 2017-06-26 LAB — PHOSPHORUS: PHOSPHORUS: 5.3 mg/dL — AB (ref 2.5–4.6)

## 2017-06-26 LAB — MAGNESIUM: Magnesium: 1.6 mg/dL — ABNORMAL LOW (ref 1.7–2.4)

## 2017-06-26 MED ORDER — PROMETHAZINE HCL 25 MG/ML IJ SOLN
6.2500 mg | INTRAMUSCULAR | Status: DC | PRN
  Administered 2017-06-26 – 2017-06-29 (×6): 6.25 mg via INTRAVENOUS
  Filled 2017-06-26 (×6): qty 1

## 2017-06-26 MED ORDER — ONDANSETRON HCL 4 MG/2ML IJ SOLN
4.0000 mg | INTRAMUSCULAR | Status: DC | PRN
  Administered 2017-06-26 – 2017-06-29 (×3): 4 mg via INTRAVENOUS
  Filled 2017-06-26 (×4): qty 2

## 2017-06-26 MED ORDER — FAT EMULSION 20 % IV EMUL
240.0000 mL | INTRAVENOUS | Status: AC
  Administered 2017-06-26: 240 mL via INTRAVENOUS
  Filled 2017-06-26: qty 250

## 2017-06-26 MED ORDER — MAGNESIUM SULFATE 2 GM/50ML IV SOLN
2.0000 g | Freq: Once | INTRAVENOUS | Status: AC
  Administered 2017-06-26: 2 g via INTRAVENOUS
  Filled 2017-06-26: qty 50

## 2017-06-26 MED ORDER — POTASSIUM CHLORIDE 10 MEQ/100ML IV SOLN
10.0000 meq | INTRAVENOUS | Status: AC
  Administered 2017-06-26 (×4): 10 meq via INTRAVENOUS
  Filled 2017-06-26 (×2): qty 100

## 2017-06-26 MED ORDER — METOCLOPRAMIDE HCL 5 MG/ML IJ SOLN
10.0000 mg | Freq: Four times a day (QID) | INTRAMUSCULAR | Status: DC
  Administered 2017-06-26 – 2017-06-28 (×8): 10 mg via INTRAVENOUS
  Filled 2017-06-26 (×8): qty 2

## 2017-06-26 MED ORDER — M.V.I. ADULT IV INJ
INJECTION | INTRAVENOUS | Status: AC
Start: 2017-06-26 — End: 2017-06-27
  Administered 2017-06-26: 17:00:00 via INTRAVENOUS
  Filled 2017-06-26 (×2): qty 2760

## 2017-06-26 MED ORDER — CLINIMIX/DEXTROSE (5/15) 5 % IV SOLN
115.0000 mL/h | INTRAVENOUS | Status: AC
  Administered 2017-06-26: 08:00:00 via INTRAVENOUS
  Filled 2017-06-26: qty 920

## 2017-06-26 MED ORDER — ONDANSETRON 4 MG PO TBDP: 4.0000 mg | ORAL_TABLET | Freq: Four times a day (QID) | ORAL | Status: DC | PRN

## 2017-06-26 NOTE — Consult Note (Signed)
[facility] Nurse wound consult note Reason for Consult:Routine change of NPWT.  I am assisted with the wound dressing change by the bedside RN today.  Her assistance is appreciated. Discussed with Margie Billet, CCS PA that Dr. Zella Richer did not need to see today or have a photo.  Ms. [name] [name] and I will change the dressing together on Monday. Wound type:Surgical Pressure Injury POA: N/A Measurement:per Monday Wound bed:75% red, 25% of wound bed with brown/tan feculent material at 6 o'clock Drainage (amount, consistency, odor) Cannister changed today. Periwound: Intact, closed edges Dressing procedure/placement/frequency: NPWT dressing removed using medical adhesive remover spray. One piece of white foam and two pieces of black foam removed. Wound cleansed with NS, gently patted dry. One piece of white foam used to cover 80% of wound bed, one piece of black foam used to cover white foam and remaining 20% of wound bed. Covered with drape and attached to 132mmHg continuous negative pressure.  An immediate seal is achieved. Patient tolerated dressing change well; used PCA pump x2 during procedure.  Next dressing change is Monday, 06/29/17.  I will perform that dressing change. [facility] nursing team will follow, and will remain available to this patient, the nursing, surgical and medical teams.  Please re-consult if needed. Thanks, Maudie Flakes, MSN, RN, [place], Arther Abbott  Pager# [phone]

## 2017-06-26 NOTE — Progress Notes (Signed)
[facility] NOTE   Pharmacy Consult for TPN Indication: prolonged ileus, small bowel leak, multiple bowel surgeries  Patient Measurements: Body mass index is 24.86 kg/m. Filed Weights   06/25/17 0428 06/25/17 1110 06/26/17 0500  Weight: 184 lb 1.4 oz (83.5 kg) 184 lb 1.4 oz (83.5 kg) 173 lb 4.5 oz (78.6 kg)   HPI: 44 yoM admitted on 7/11 for enlarging adrenal mass and planned adrenalectomy.  Pharmacy consulted to dose TPN.  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   7/22 OR:  wash out, tube ileostomy, left with open abdomen & wound vac in place.  Plan for OR on 7/25 for closure 7/25 OR: mesh placed for abdominal closure, wound vac 8/2 extubated, propofol drip off 8/4 TPN off for ~ 5 hours due to line detached from filter 8/10 MD request decrease rate x 1 week 2nd low sodium 8/12 Additional sodium added to TPN due to persistent hyponatremia 8/16 Increasing rate back to goal since sodium has improved with above intervention.   8/20 Prealbumin decreased.  New apparent enteric leak per surgery notes. 8/21 CaPhos product elevated, remove electrolytes from TPN 8/25 PICC did not have blood return in 2 of the 3 ports.  Alteplase intra-catheter and function returned.  TPN infusing correctly on 8/26 per RN. 8/28: d/c SSI 8/30: added electrolytes to TPN 8/31: TPN IV line came out around ~0340 and current bag was stopped at this time.  Restart new clinimix 5/15 1L bag (no lytes since phos is elevated this morning) until next bag is due at [facility]. Will not add MVI and trace elements to this 1L  bag. Use electrolyte free clinimix with new bag at 6PM  Insulin requirements past 24 hours: not on insulin (no hx DM)  Current Nutrition: Clear Liquid Diet (8/21) - boost bid started on 8/22 (pt didn't take any from 8/24-8/26) - pro-stat 30 mL daily started on 8/27  IVF: none  Central access: PICC line ordered 7/19, placed on 7/20 TPN start date: 7/20  ASSESSMENT                                                                                                           Today, 06/26/17  - Glucose (goal <150): at goal -  Electrolytes: K 3.5, Mag low at 1.6 but up after repletion and resumption of clinimix E, Na trending up and slightly low at 134, phos up to 5.3 with lytes added to clinimix yesterday, CorrCa wnl at 10.32; Ca/phos product 55 - Renal:  SCr wnl, stable. - LFTs: WNL except alkaline phos elevated- up slightly to 180 - TGs:  296 (7/18), 266 (7/20), 269 (7/23), 210 (7/28), 285 (7/31) 301 (8/3), increased 323 (8/14), 238 (8/20), trending  down to 203 (8/27) - Prealbumin:  <5 (7/20), 6.1 (7/24), 10.1 (7/30) 24.8 (8/6), 26.4 (8/13), 15.9 (8/20), 19.7 (8/27)  NUTRITIONAL GOALS                                                                                             RD recs (8/27):  122-138 gms Protein (1.5 - 1.7 g/kg) 2030 - 2270 Kcal  Recalculation: Clinimix 5/15 at rate of 115 ml/hr with lipids 3x/week on MWF only would provide: 138g protein and avg 2164 kcal/day  - Glucose infusion rate will be 3.53 mg/kg/min (Maximum 5 mg/kg/min)   PLAN                                                                                                                         Now:  - Magnesium 2 g IV x1 dose - potassium chloride 10 mEq IV x 4 runs - Will hang 1 L bag of clinimix electrolytes free at rate of 115 ml/hr (will last for 8 hrs -- from 8a to 4p)  At 1800 today: ? Start Clinimix 5/15 (Electrolytes free) at 115 ml/hr.  Will hold off on adding additional sodium to bag for now since Na is  only slightly below goal--monitor closely for now ? Lipids at 72mls/hr x 12 hours 3 times weekly on MWF  TPN to contain standard multivitamins and trace elements daily  TPN lab panels on Mondays & Thursdays.  Check BMET, Mag & Phos daily  Dia Sitter, PharmD, BCPS 06/26/2017 7:24 AM

## 2017-06-26 NOTE — Progress Notes (Signed)
Occupational Therapy Treatment and Goal Update Patient Details Name: LARON BOORMAN MRN: [id number] DOB: 09-20-1961 Today's Date: 06/26/2017    History of present illness  56 y.o. 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 On 7/18, he developed a leak therefore was taken back to OR for re-do of ex-lap and repair of leak on 05/20/17.  He returned to the ICU on the vent . Extubated on 05/28/17.    OT comments  Pt is making good progress with OT. Goals updated today.    Follow Up Recommendations  SNF    Equipment Recommendations  3 in 1 bedside commode    Recommendations for Other Services      Precautions / Restrictions Precautions Precautions: Fall Precaution Comments: multiple lines in abdomen, VAC, NG, and JP drain Restrictions Weight Bearing Restrictions: No       Mobility Bed Mobility     Rolling: Min guard Sidelying to sit: Min assist       General bed mobility comments: assist for lines and light min A to sit up  Transfers   Equipment used: Rolling walker (2 wheeled)   Sit to Stand: Min guard;+2 safety/equipment;Min assist         General transfer comment: Min a to help control descent to chair; assist for multiple lines    Balance                                           ADL either performed or assessed with clinical judgement   ADL                       Lower Body Dressing: Moderate assistance;Bed level Lower Body Dressing Details (indicate cue type and reason): shorts only.  Pt's wife started these up to knees and pt bridged to pull up.  He states it didn't hurt more but he might be more comfortable standing and pulling pants up Toilet Transfer: Minimal assistance;Ambulation;RW (chair (simulated))             General ADL Comments: Pt  did not want to walk to bathroom this session. He was agreeable to walking out in hall.  Min +2 assist for lines. (steadying).  HR 107-133     Vision       Perception     Praxis      Cognition Arousal/Alertness: Awake/alert Behavior During Therapy: WFL for tasks assessed/performed Overall Cognitive Status: Within Functional Limits for tasks assessed                                          Exercises Other Exercises Other Exercises: performed 2 sets of 10 FF and one set of 10 abduction AROM   Shoulder Instructions       General Comments      Pertinent Vitals/ Pain       Pain Score: 7  Pain Location: R side abdomen  Pain Descriptors / Indicators: Sore;Grimacing Pain Intervention(s): Limited activity within patient's tolerance;Monitored during session;Repositioned;PCA encouraged  Home Living  Prior Functioning/Environment              Frequency           Progress Toward Goals  OT Goals(current goals can now be found in the care plan section)  Progress towards OT goals: Goals met and updated - see care plan  Acute Rehab OT Goals OT Goal Formulation: With patient Time For Goal Achievement: 07/10/17 Potential to Achieve Goals: Good ADL Goals Pt Will Transfer to Toilet: with min guard assist;with +2 assist;bedside commode;ambulating Pt Will Perform Toileting - Clothing Manipulation and hygiene: with min assist;sit to/from stand Additional ADL Goal #1: pt will perform 3 sets of 10 x 2 shoulder exercises to increase strength for adls/transfers Additional ADL Goal #2: Pt will tolerate 5 minutes of static standing for adl activities  Plan      Co-evaluation                 AM-PAC PT "6 Clicks" Daily Activity     Outcome Measure   Help from another person eating meals?: A Little Help from another person taking care of personal grooming?: A Little Help from another person  toileting, which includes using toliet, bedpan, or urinal?: A Lot Help from another person bathing (including washing, rinsing, drying)?: A Lot Help from another person to put on and taking off regular upper body clothing?: A Little Help from another person to put on and taking off regular lower body clothing?: A Lot 6 Click Score: 15    End of Session    OT Visit Diagnosis: Unsteadiness on feet (R26.81);Muscle weakness (generalized) (M62.81)   Activity Tolerance Patient tolerated treatment well   Patient Left in chair;with call bell/phone within reach;with family/visitor present   Nurse Communication          Time: 0340-3524 OT Time Calculation (min): 30 min  Charges: OT General Charges $OT Visit: 1 Visit OT Treatments $Self Care/Home Management : 8-22 mins $Therapeutic Exercise: 8-22 mins  [name], OTR/L [phone] 06/26/2017   Arnel Wymer 06/26/2017, 3:18 PM

## 2017-06-26 NOTE — Progress Notes (Signed)
Assessment OPEN RIGHT ADRENALECTOMY WITH SMALL BOWEL RESECTION AND ANASTOMOSIS - 05/06/2017 - [name] - Additional surgery - 05/13/2017, 05/14/2017, 05/17/2017 and 05/20/2017 - Currently with distal ileum in discontinuity and catheter drainage of the 2 bowel ends. Now has new, low volume enteric leak from a separate anastomosis communicating to the anterior abdomen and the VAC dressing.  - Last CT scan on 08/14  - continue VACM, W, F HTN Malnutrition - On TPN; Prealbumin 19.7 (range 18-38) - allow sips of elemental fluids with NGT clamped Anemia - Hgb - 9.1 Chronic pain FEN: IVF, TPN, NGT, elemental liquids VTE: SCD's, lovenox Foley: none Follow up: TBD  DISPO: Continue current management. Encourage patient to get up and out of the bed  Plan:  VAC change today.  Continue bowel rest and TPN.  Work with anti emetics with goal of getting ng tube out.in 48 hours if nausea subsides.   LOS: 51 days     37 Days Post-Op  Chief Complaint/Subjective: Nausea is still an issue  Objective: Vital signs in last 24 hours: Temp:  [97.6 F (36.4 C)-98.4 F (36.9 C)] 97.6 F (36.4 C) (08/31 0411) Pulse Rate:  [85-103] 94 (08/31 0600) Resp:  [14-27] 18 (08/31 0600) BP: (118-130)/(73-97) 121/73 (08/31 0200) SpO2:  [97 %-99 %] 97 % (08/31 0600) FiO2 (%):  [99 %] 99 % (08/31 0043) Weight:  [78.6 kg (173 lb 4.5 oz)-83.5 kg (184 lb 1.4 oz)] 78.6 kg (173 lb 4.5 oz) (08/31 0500) Last BM Date: 06/23/17  Intake/Output from previous day: 08/30 0701 - 08/31 0700 In: 1756.2 [I.V.:1146.2; NG/GT:20; IV Piggyback:570] Out: 700 [Urine:700] Intake/Output this shift: No intake/output data recorded.  PE: General- In NAD.  Awake and alert. Abdomen-soft, VAC on right flank wound with small amount of enteric-like drainage, multiple drains present and draining enteric contents  Lab Results:  No results for input(s): WBC, HGB, HCT, PLT in the last 72 hours. BMET  Recent Labs   06/25/17 1107 06/26/17 0533  NA 132* 134*  K 3.2* 3.5  CL 99* 99*  CO2 24 25  GLUCOSE 129* 93  BUN 20 19  CREATININE 0.59* 0.67  CALCIUM 9.0 9.2   PT/INR No results for input(s): LABPROT, INR in the last 72 hours. Comprehensive Metabolic Panel:    Component Value Date/Time   NA 134 (L) 06/26/2017 0533   NA 132 (L) 06/25/2017 1107   K 3.5 06/26/2017 0533   K 3.2 (L) 06/25/2017 1107   CL 99 (L) 06/26/2017 0533   CL 99 (L) 06/25/2017 1107   CO2 25 06/26/2017 0533   CO2 24 06/25/2017 1107   BUN 19 06/26/2017 0533   BUN 20 06/25/2017 1107   CREATININE 0.67 06/26/2017 0533   CREATININE 0.59 (L) 06/25/2017 1107   CREATININE 0.85 07/22/2013 1021   GLUCOSE 93 06/26/2017 0533   GLUCOSE 129 (H) 06/25/2017 1107   CALCIUM 9.2 06/26/2017 0533   CALCIUM 9.0 06/25/2017 1107   AST 24 06/25/2017 1107   AST 26 06/22/2017 0543   ALT 24 06/25/2017 1107   ALT 25 06/22/2017 0543   ALKPHOS 180 (H) 06/25/2017 1107   ALKPHOS 172 (H) 06/22/2017 0543   BILITOT 0.6 06/25/2017 1107   BILITOT 0.8 06/22/2017 0543   PROT 7.0 06/25/2017 1107   PROT 7.0 06/22/2017 0543   ALBUMIN 2.6 (L) 06/25/2017 1107   ALBUMIN 2.7 (L) 06/22/2017 0543     Studies/Results: No results found.  Anti-infectives: Anti-infectives    Start     Dose/Rate Route  Frequency Ordered Stop   06/16/17 1200  piperacillin-tazobactam (ZOSYN) IVPB 3.375 g     3.375 g 12.5 mL/hr over 240 Minutes Intravenous Every 8 hours 06/16/17 1026 06/17/17 0004   06/08/17 1200  piperacillin-tazobactam (ZOSYN) IVPB 3.375 g  Status:  Discontinued     3.375 g 12.5 mL/hr over 240 Minutes Intravenous Every 8 hours 06/08/17 1056 06/16/17 1026   05/29/17 2200  ceFEPIme (MAXIPIME) 2 g in dextrose 5 % 50 mL IVPB  Status:  Discontinued     2 g 100 mL/hr over 30 Minutes Intravenous Every 8 hours 05/29/17 2031 06/03/17 0832   05/29/17 2100  metroNIDAZOLE (FLAGYL) IVPB 500 mg  Status:  Discontinued     500 mg 100 mL/hr over 60 Minutes Intravenous  Every 8 hours 05/29/17 2029 06/03/17 0832   05/26/17 2200  ceFAZolin (ANCEF) IVPB 1 g/50 mL premix  Status:  Discontinued     1 g 100 mL/hr over 30 Minutes Intravenous Every 8 hours 05/26/17 1008 05/29/17 2023   05/25/17 1100  vancomycin (VANCOCIN) 1,250 mg in sodium chloride 0.9 % 250 mL IVPB  Status:  Discontinued     1,250 mg 166.7 mL/hr over 90 Minutes Intravenous Every 12 hours 05/25/17 0955 05/26/17 0946   05/22/17 1400  anidulafungin (ERAXIS) 100 mg in sodium chloride 0.9 % 100 mL IVPB     100 mg 78 mL/hr over 100 Minutes Intravenous Every 24 hours 05/21/17 1330 05/27/17 1740   05/21/17 1400  anidulafungin (ERAXIS) 200 mg in sodium chloride 0.9 % 200 mL IVPB     200 mg 78 mL/hr over 200 Minutes Intravenous  Once 05/21/17 1330 05/21/17 1940   05/15/17 1000  anidulafungin (ERAXIS) 100 mg in sodium chloride 0.9 % 100 mL IVPB  Status:  Discontinued     100 mg 78 mL/hr over 100 Minutes Intravenous Every 24 hours 05/14/17 0829 05/16/17 0901   05/15/17 1000  metroNIDAZOLE (FLAGYL) IVPB 500 mg  Status:  Discontinued     500 mg 100 mL/hr over 60 Minutes Intravenous Every 8 hours 05/15/17 0903 05/25/17 0938   05/14/17 0900  anidulafungin (ERAXIS) 200 mg in sodium chloride 0.9 % 200 mL IVPB     200 mg 78 mL/hr over 200 Minutes Intravenous  Once 05/14/17 0829 05/14/17 1325   05/13/17 1927  vancomycin (VANCOCIN) 1-5 GM/200ML-% IVPB    Comments:  Ward, Christa   : cabinet override      05/13/17 1927 05/14/17 0744   05/12/17 1400  ceFEPIme (MAXIPIME) 1 g in dextrose 5 % 50 mL IVPB     1 g 100 mL/hr over 30 Minutes Intravenous Every 8 hours 05/12/17 0939 05/26/17 2359   05/12/17 0900  vancomycin (VANCOCIN) IVPB 1000 mg/200 mL premix  Status:  Discontinued     1,000 mg 200 mL/hr over 60 Minutes Intravenous Every 12 hours 05/12/17 0750 05/16/17 0852   05/12/17 0830  aztreonam (AZACTAM) 2 GM IVPB     2 g 100 mL/hr over 30 Minutes Intravenous  Once 05/12/17 0800 05/12/17 0957   05/06/17 0916   vancomycin (VANCOCIN) IVPB 1000 mg/200 mL premix     1,000 mg 200 mL/hr over 60 Minutes Intravenous On call to O.R. 05/06/17 0916 05/06/17 1229       Lurena Naeve J 06/26/2017

## 2017-06-27 ENCOUNTER — Encounter (HOSPITAL_COMMUNITY): Payer: Self-pay | Admitting: Surgery

## 2017-06-27 DIAGNOSIS — E43 Unspecified severe protein-calorie malnutrition: Secondary | ICD-10-CM

## 2017-06-27 DIAGNOSIS — D3501 Benign neoplasm of right adrenal gland: Secondary | ICD-10-CM

## 2017-06-27 DIAGNOSIS — K632 Fistula of intestine: Secondary | ICD-10-CM

## 2017-06-27 HISTORY — DX: Benign neoplasm of right adrenal gland: D35.01

## 2017-06-27 LAB — BASIC METABOLIC PANEL
Anion gap: 10 (ref 5–15)
BUN: 22 mg/dL — AB (ref 6–20)
CO2: 22 mmol/L (ref 22–32)
Calcium: 9.6 mg/dL (ref 8.9–10.3)
Chloride: 97 mmol/L — ABNORMAL LOW (ref 101–111)
Creatinine, Ser: 0.72 mg/dL (ref 0.61–1.24)
GFR calc Af Amer: >60 mL/min (ref >60)
GLUCOSE: 118 mg/dL — AB (ref 65–99)
POTASSIUM: 3.4 mmol/L — AB (ref 3.5–5.1)
Sodium: 129 mmol/L — ABNORMAL LOW (ref 135–145)

## 2017-06-27 LAB — MAGNESIUM: Magnesium: 1.3 mg/dL — ABNORMAL LOW (ref 1.7–2.4)

## 2017-06-27 LAB — PHOSPHORUS: PHOSPHORUS: 5 mg/dL — AB (ref 2.5–4.6)

## 2017-06-27 MED ORDER — POTASSIUM CHLORIDE 10 MEQ/100ML IV SOLN
10.0000 meq | INTRAVENOUS | Status: AC
  Administered 2017-06-27 (×6): 10 meq via INTRAVENOUS
  Filled 2017-06-27 (×6): qty 100

## 2017-06-27 MED ORDER — ALTEPLASE 2 MG IJ SOLR
2.0000 mg | Freq: Once | INTRAMUSCULAR | Status: AC
  Administered 2017-06-27: 2 mg
  Filled 2017-06-27: qty 2

## 2017-06-27 MED ORDER — OXYCODONE HCL 5 MG/5ML PO SOLN
10.0000 mg | ORAL | Status: DC
  Administered 2017-06-28: 10 mg via ORAL
  Filled 2017-06-27: qty 10

## 2017-06-27 MED ORDER — SODIUM CHLORIDE 4 MEQ/ML IV SOLN
INTRAVENOUS | Status: AC
  Administered 2017-06-27: 17:00:00 via INTRAVENOUS
  Filled 2017-06-27: qty 2760
  Filled 2017-06-27: qty 2000

## 2017-06-27 MED ORDER — ACETAMINOPHEN 650 MG RE SUPP
650.0000 mg | RECTAL | Status: DC | PRN
  Administered 2017-07-31 – 2017-08-01 (×3): 650 mg via RECTAL
  Filled 2017-06-27 (×3): qty 1

## 2017-06-27 MED ORDER — ACETAMINOPHEN 80 MG RE SUPP: 500.0000 mg | RECTAL | Status: DC | PRN

## 2017-06-27 MED ORDER — LORAZEPAM 2 MG/ML PO CONC: 2.0000 mg | Freq: Four times a day (QID) | ORAL | Status: DC | PRN

## 2017-06-27 MED ORDER — MAGNESIUM SULFATE 4 GM/100ML IV SOLN
4.0000 g | Freq: Once | INTRAVENOUS | Status: AC
  Administered 2017-06-27: 4 g via INTRAVENOUS
  Filled 2017-06-27: qty 100

## 2017-06-27 MED ORDER — HYDROMORPHONE BOLUS VIA INFUSION
1.0000 mg | INTRAVENOUS | Status: DC | PRN
  Filled 2017-06-27: qty 2

## 2017-06-27 MED ORDER — LORAZEPAM 2 MG/ML IJ SOLN
1.0000 mg | Freq: Four times a day (QID) | INTRAMUSCULAR | Status: DC | PRN
  Administered 2017-06-27 – 2017-06-29 (×4): 1 mg via INTRAVENOUS
  Filled 2017-06-27 (×4): qty 1

## 2017-06-27 MED ORDER — HYDROMORPHONE 1 MG/ML IV SOLN
INTRAVENOUS | Status: DC
  Administered 2017-06-27: 5.36 mg via INTRAVENOUS
  Administered 2017-06-27: 2.8 mg via INTRAVENOUS
  Administered 2017-06-28: 06:00:00 via INTRAVENOUS
  Administered 2017-06-28: 4.3 mg via INTRAVENOUS
  Administered 2017-06-28: 3.83 mg via INTRAVENOUS
  Filled 2017-06-27: qty 25

## 2017-06-27 MED ORDER — LORAZEPAM 2 MG/ML PO CONC
2.0000 mg | Freq: Three times a day (TID) | ORAL | Status: DC | PRN
  Administered 2017-06-27 – 2017-06-28 (×2): 2 mg via ORAL
  Filled 2017-06-27 (×2): qty 1

## 2017-06-27 NOTE — Progress Notes (Addendum)
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 Active Problems:   Protein-calorie malnutrition, severe (HCC)   GERD   Tobacco abuse   COPD (chronic obstructive pulmonary disease) ([place])   Essential hypertension   Hyperthyroidism   Intra-abdominal adhesions s/p SB resection 05/06/2017   Chronic narcotic use   Anxiety state   Hypokalemia   Anastomotic leak of intestine   Severe sepsis (HCC)   Wound dehiscence, surgical   Aspiration pneumonia (HCC)   Acute respiratory failure with hypoxemia (HCC)   Ileus (HCC)   Mucus plugging of bronchi   Pressure injury of skin   Peritonitis ([place])   Pheochromocytoma, right, 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  Enterocutaneous fistula Currently with distal ileum in discontinuity and catheter drainage of the 2 bowel ends.  Low volume enteric leak from a separate anastomosis communicating to the anterior abdomen and the VAC dressing.  -Pulled NGT out again - follow without -Bowel rest - low vol clears only as tolerated -Internal tube drainage at SB discontinuity  -continue VACqMWF -consider drain study of R flank drain if low output     Most likely wait 6-12 months from last surgery to improve nutrition, wean off tobacco, and normal activity before attempting ex lap & reanastomosis (has leaked & broken down numerous prior attempts)  - Last CT scan on 08/14 - resolving inflammation & fistulas controlled -   Malnutrition - On TPN; Prealbumin 19.7 (range 18-38) - allow sips of clear liquids & follow output  HTN -controlled s/p Pheochromocytoma 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  Adrenal gland, resection, right - PHEOCHROMOCYTOMA, 3.5 CM. - CONFINED TO ADRENAL GLAND. - RESECTION  MARGINS ARE NEGATIVE. - SEE COMMENT. Microscopic Comment . The cells are positive for CD56, chromogranin, and synaptophysin. S100 is weakly positive and stains focal sustentancular cells. Calretinin, inhibin, cytokeratin, and MelanA are negative. According to AJCC TNM staging the tumor is best classified pT1, pNX, pMX. Dr. Saralyn Pilar has reviewed the case. Dr. Kieth Brightly was paged on 05/12/2017.  Anemia - Hgb - 9.1  Chronic pain -on PCA - consider weaning -continue PO meds as tolerated  Anxiety -wean ativan as tolerated  FEN: IVF, TPN, NGT, elemental liquids  VTE: SCD's, lovenox  Foley: none  Tob abuse: quit smoking to avoid further complications/breakdowns  Follow up: TBD  DISPO: Continue current management. Encourage patient to get up and out of the bed  I updated the patient's status to the patient.  Recommendations were made.  Questions were answered.  He expressed understanding & appreciation.  45 minutes of review, H&P, planning, consultation.    LOS: 52 days     38 Days Post-Op  Chief Complaint/Subjective: Pulled out NGT fri night ("I had a nightmare") Denies much pain Got up yesterday   Objective: Vital signs in last 24 hours: Temp:  [97.8 F (36.6 C)-98.8 F (37.1 C)] 98.8 F (37.1 C) (09/01 0324) Pulse Rate:  [91-107] 97 (09/01 0600) Resp:  [15-24] 19 (09/01 0600) BP: (109-126)/(76-92) 126/92 (09/01 0600) SpO2:  [95 %-100 %] 98 % (09/01 0600) Weight:  [79.7 kg (175 lb 11.3 oz)] 79.7 kg (175 lb 11.3 oz) (09/01 0500) Last BM Date: 06/23/17  Intake/Output from previous day: 08/31 0701 - 09/01 0700 In: 1555.2 [I.V.:875.2; IV Piggyback:680] Out: 1100 [Urine:700; Drains:400] Intake/Output this shift: No intake/output data recorded.  PE: General: Pt awake/alert/oriented x4 in no major acute distress Eyes: PERRL, normal EOM. Sclera nonicteric Neuro: CN II-XII intact w/o focal sensory/motor deficits. Lymph: No head/neck/groin lymphadenopathy Psych:   No delerium/psychosis/paranoia HENT: Normocephalic, Mucus membranes moist.  No thrush Neck: Supple, No tracheal deviation Chest: No pain.  Good respiratory excursion. CV:  Pulses intact.  Regular rhythm MS: Normal AROM mjr joints.  No obvious deformity  Abdomen: soft, VAC on right flank wound with small amount of enteric-like drainage, 2 foley tube drains in prox/distal ileum.  R flank surgical drain   Ext:  SCDs BLE.  No significant edema.  No cyanosis Skin: No petechiae / purpura  Abdomen- Lab Results:  No results for input(s): WBC, HGB, HCT, PLT in the last 72 hours. BMET  Recent Labs  06/25/17 1107 06/26/17 0533  NA 132* 134*  K 3.2* 3.5  CL 99* 99*  CO2 24 25  GLUCOSE 129* 93  BUN 20 19  CREATININE 0.59* 0.67  CALCIUM 9.0 9.2   PT/INR No results for input(s): LABPROT, INR in the last 72 hours. Comprehensive Metabolic Panel:    Component Value Date/Time   NA 134 (L) 06/26/2017 0533   NA 132 (L) 06/25/2017 1107   K 3.5 06/26/2017 0533   K 3.2 (L) 06/25/2017 1107   CL 99 (L) 06/26/2017 0533   CL 99 (L) 06/25/2017 1107   CO2 25 06/26/2017 0533   CO2 24 06/25/2017 1107   BUN 19 06/26/2017 0533   BUN 20 06/25/2017 1107   CREATININE 0.67 06/26/2017 0533   CREATININE 0.59 (L) 06/25/2017 1107   CREATININE 0.85 07/22/2013 1021   GLUCOSE 93 06/26/2017 0533   GLUCOSE 129 (H) 06/25/2017 1107   CALCIUM 9.2 06/26/2017 0533   CALCIUM 9.0 06/25/2017 1107   AST 24 06/25/2017 1107   AST 26 06/22/2017 0543   ALT 24 06/25/2017 1107   ALT 25 06/22/2017 0543   ALKPHOS 180 (H) 06/25/2017 1107   ALKPHOS 172 (H) 06/22/2017 0543   BILITOT 0.6 06/25/2017 1107   BILITOT 0.8 06/22/2017 0543   PROT 7.0 06/25/2017 1107   PROT 7.0 06/22/2017 0543   ALBUMIN 2.6 (L) 06/25/2017 1107   ALBUMIN 2.7 (L) 06/22/2017 0543     Studies/Results: No results found.  Anti-infectives: Anti-infectives    Start     Dose/Rate Route Frequency Ordered Stop   06/16/17 1200   piperacillin-tazobactam (ZOSYN) IVPB 3.375 g     3.375 g 12.5 mL/hr over 240 Minutes Intravenous Every 8 hours 06/16/17 1026 06/17/17 0004   06/08/17 1200  piperacillin-tazobactam (ZOSYN) IVPB 3.375 g  Status:  Discontinued     3.375 g 12.5 mL/hr over 240 Minutes Intravenous Every 8 hours 06/08/17 1056 06/16/17 1026   05/29/17 2200  ceFEPIme (MAXIPIME) 2 g in dextrose 5 % 50 mL IVPB  Status:  Discontinued     2 g 100 mL/hr over 30 Minutes Intravenous Every 8 hours 05/29/17 2031 06/03/17 0832   05/29/17 2100  metroNIDAZOLE (FLAGYL) IVPB 500 mg  Status:  Discontinued     500 mg 100 mL/hr over 60 Minutes Intravenous Every 8 hours 05/29/17 2029 06/03/17 0832   05/26/17 2200  ceFAZolin (ANCEF) IVPB 1 g/50 mL premix  Status:  Discontinued     1 g 100 mL/hr over 30 Minutes Intravenous Every 8 hours 05/26/17 1008 05/29/17 2023   05/25/17 1100  vancomycin (VANCOCIN) 1,250 mg in sodium chloride 0.9 % 250 mL IVPB  Status:  Discontinued     1,250  mg 166.7 mL/hr over 90 Minutes Intravenous Every 12 hours 05/25/17 0955 05/26/17 0946   05/22/17 1400  anidulafungin (ERAXIS) 100 mg in sodium chloride 0.9 % 100 mL IVPB     100 mg 78 mL/hr over 100 Minutes Intravenous Every 24 hours 05/21/17 1330 05/27/17 1740   05/21/17 1400  anidulafungin (ERAXIS) 200 mg in sodium chloride 0.9 % 200 mL IVPB     200 mg 78 mL/hr over 200 Minutes Intravenous  Once 05/21/17 1330 05/21/17 1940   05/15/17 1000  anidulafungin (ERAXIS) 100 mg in sodium chloride 0.9 % 100 mL IVPB  Status:  Discontinued     100 mg 78 mL/hr over 100 Minutes Intravenous Every 24 hours 05/14/17 0829 05/16/17 0901   05/15/17 1000  metroNIDAZOLE (FLAGYL) IVPB 500 mg  Status:  Discontinued     500 mg 100 mL/hr over 60 Minutes Intravenous Every 8 hours 05/15/17 0903 05/25/17 0938   05/14/17 0900  anidulafungin (ERAXIS) 200 mg in sodium chloride 0.9 % 200 mL IVPB     200 mg 78 mL/hr over 200 Minutes Intravenous  Once 05/14/17 0829 05/14/17 1325    05/13/17 1927  vancomycin (VANCOCIN) 1-5 GM/200ML-% IVPB    Comments:  Ward, Christa   : cabinet override      05/13/17 1927 05/14/17 0744   05/12/17 1400  ceFEPIme (MAXIPIME) 1 g in dextrose 5 % 50 mL IVPB     1 g 100 mL/hr over 30 Minutes Intravenous Every 8 hours 05/12/17 0939 05/26/17 2359   05/12/17 0900  vancomycin (VANCOCIN) IVPB 1000 mg/200 mL premix  Status:  Discontinued     1,000 mg 200 mL/hr over 60 Minutes Intravenous Every 12 hours 05/12/17 0750 05/16/17 0852   05/12/17 0830  aztreonam (AZACTAM) 2 GM IVPB     2 g 100 mL/hr over 30 Minutes Intravenous  Once 05/12/17 0800 05/12/17 0957   05/06/17 0916  vancomycin (VANCOCIN) IVPB 1000 mg/200 mL premix     1,000 mg 200 mL/hr over 60 Minutes Intravenous On call to O.R. 05/06/17 0916 05/06/17 1229       Jyasia Markoff C. 06/27/2017

## 2017-06-27 NOTE — Progress Notes (Signed)
PHARMACY - ADULT TOTAL PARENTERAL NUTRITION CONSULT NOTE   Pharmacy Consult for TPN Indication: prolonged ileus, small bowel leak, multiple bowel surgeries  Patient Measurements: Body mass index is 25.21 kg/m. Filed Weights   06/25/17 1110 06/26/17 0500 06/27/17 0500  Weight: 184 lb 1.4 oz (83.5 kg) 173 lb 4.5 oz (78.6 kg) 175 lb 11.3 oz (79.7 kg)   HPI: 55 yoM admitted on 7/11 for enlarging adrenal mass and planned adrenalectomy.  Pharmacy consulted to dose TPN.  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   7/22 OR:  wash out, tube ileostomy, left with open abdomen & wound vac in place.  Plan for OR on 7/25 for closure 7/25 OR: mesh placed for abdominal closure, wound vac 8/2 extubated, propofol drip off 8/4 TPN off for ~ 5 hours due to line detached from filter 8/10 MD request decrease rate x 1 week 2nd low sodium 8/12 Additional sodium added to TPN due to persistent hyponatremia 8/16 Increasing rate back to goal since sodium has improved with above intervention.   8/20 Prealbumin decreased.  New apparent enteric leak per surgery notes. 8/21 CaPhos product elevated, remove electrolytes from TPN 8/25 PICC did not have blood return in 2 of the 3 ports.  Alteplase intra-catheter and function returned.  TPN infusing correctly on 8/26 per RN. 8/28: d/c SSI 8/30: added electrolytes to TPN 8/31: TPN IV line came out around ~0340 and current bag was stopped at this time.  Restart new clinimix 5/15 1L bag (no lytes since phos is elevated this morning) until next bag is due at [facility]. Will not add MVI and trace elements to this 1L  bag. Use electrolyte free clinimix with new bag at 6PM  Insulin requirements past 24 hours: not on insulin (no hx DM)  Current Nutrition: Clear Liquid Diet (8/21) - boost bid started on 8/22 (pt didn't take any from 8/24-8/26) - pro-stat 30 mL daily started on 8/27  IVF: none  Central access: PICC line ordered 7/19, placed on 7/20 TPN start date: 7/20  ASSESSMENT                                                                                                           Today, 06/27/17  - Glucose (goal <150): at goal -  Electrolytes: K low 3.4, Mag down 1.3 with lytes removed from TPN; Na down 129, phos down to 5.0 with removed from TPN yesterday, CorrCa slightly evelated at 10.72; Ca/phos product <55 - Renal:  SCr wnl - LFTs: WNL except alkaline phos elevated- up slightly to 180 - TGs:  296 (7/18), 266 (7/20), 269 (7/23), 210 (7/28), 285 (7/31) 301 (8/3), increased 323 (8/14), 238 (8/20), trending down to 203 (8/27) - Prealbumin:  <5 (7/20), 6.1 (  7/24), 10.1 (7/30) 24.8 (8/6), 26.4 (8/13), 15.9 (8/20), 19.7 (8/27)  NUTRITIONAL GOALS                                                                                             RD recs (8/27):  122-138 gms Protein (1.5 - 1.7 g/kg) 2030 - 2270 Kcal  Recalculation: Clinimix 5/15 at rate of 115 ml/hr with lipids 3x/week on MWF only would provide: 138g protein and avg 2164 kcal/day  - Glucose infusion rate will be 3.53 mg/kg/min (Maximum 5 mg/kg/min)   PLAN                                                                                                                         Now:  - Magnesium 4 g IV x1 dose - potassium chloride 10 mEq IV x 6 runs  At 1800 today: ? continune Clinimix 5/15 (Electrolytes free) at 115 ml/hr.   Add sodium to make equivalent to 0.45% NaCl (add 75 mEq of Na/L in a 1:1 YB:WLSLHTD ratio) for hyponatremia ? Lipids at 45mls/hr x 12 hours 3 times weekly on MWF  TPN to contain standard multivitamins and trace  elements daily  TPN lab panels on Mondays & Thursdays.  Check BMET, Mag & Phos daily  Dia Sitter, PharmD, BCPS 06/27/2017 7:36 AM

## 2017-06-28 ENCOUNTER — Encounter (HOSPITAL_COMMUNITY): Payer: Self-pay | Admitting: Surgery

## 2017-06-28 DIAGNOSIS — E039 Hypothyroidism, unspecified: Secondary | ICD-10-CM | POA: Diagnosis present

## 2017-06-28 LAB — PHOSPHORUS: PHOSPHORUS: 5.1 mg/dL — AB (ref 2.5–4.6)

## 2017-06-28 LAB — BASIC METABOLIC PANEL
ANION GAP: 6 (ref 5–15)
BUN: 23 mg/dL — ABNORMAL HIGH (ref 6–20)
CHLORIDE: 99 mmol/L — AB (ref 101–111)
CO2: 25 mmol/L (ref 22–32)
Calcium: 9.2 mg/dL (ref 8.9–10.3)
Creatinine, Ser: 0.69 mg/dL (ref 0.61–1.24)
Glucose, Bld: 110 mg/dL — ABNORMAL HIGH (ref 65–99)
POTASSIUM: 3.5 mmol/L (ref 3.5–5.1)
SODIUM: 130 mmol/L — AB (ref 135–145)

## 2017-06-28 LAB — MAGNESIUM: MAGNESIUM: 1.5 mg/dL — AB (ref 1.7–2.4)

## 2017-06-28 MED ORDER — FERROUS SULFATE 325 (65 FE) MG PO TABS
325.0000 mg | ORAL_TABLET | Freq: Two times a day (BID) | ORAL | Status: DC
  Administered 2017-06-28 (×2): 325 mg via ORAL
  Filled 2017-06-28 (×2): qty 1

## 2017-06-28 MED ORDER — NICOTINE 14 MG/24HR TD PT24
14.0000 mg | MEDICATED_PATCH | Freq: Every day | TRANSDERMAL | Status: DC
  Administered 2017-06-28 – 2017-08-16 (×49): 14 mg via TRANSDERMAL
  Filled 2017-06-28 (×49): qty 1

## 2017-06-28 MED ORDER — METHOCARBAMOL 1000 MG/10ML IJ SOLN
1000.0000 mg | Freq: Four times a day (QID) | INTRAVENOUS | Status: DC | PRN
  Filled 2017-06-28: qty 10

## 2017-06-28 MED ORDER — BOOST / RESOURCE BREEZE PO LIQD: 0.5000 | Freq: Two times a day (BID) | ORAL | Status: DC

## 2017-06-28 MED ORDER — OXYCODONE HCL 5 MG/5ML PO SOLN
10.0000 mg | ORAL | Status: DC | PRN
  Administered 2017-06-29: 10 mg via ORAL
  Filled 2017-06-28: qty 10

## 2017-06-28 MED ORDER — HYDROMORPHONE 1 MG/ML IV SOLN
INTRAVENOUS | Status: DC
  Administered 2017-06-28: 4.78 mg via INTRAVENOUS

## 2017-06-28 MED ORDER — HYDROMORPHONE 1 MG/ML IV SOLN
INTRAVENOUS | Status: DC
  Administered 2017-06-28: 6.93 mg via INTRAVENOUS
  Administered 2017-06-28: 0 mg via INTRAVENOUS
  Administered 2017-06-28: 3.45 mg via INTRAVENOUS
  Administered 2017-06-29: 2.96 mg via INTRAVENOUS
  Administered 2017-06-29: 6.88 mg via INTRAVENOUS
  Administered 2017-06-29: 6.89 mg via INTRAVENOUS
  Administered 2017-06-29: 02:00:00 via INTRAVENOUS
  Filled 2017-06-28: qty 25

## 2017-06-28 MED ORDER — POTASSIUM CHLORIDE CRYS ER 20 MEQ PO TBCR
20.0000 meq | EXTENDED_RELEASE_TABLET | Freq: Every day | ORAL | Status: DC
  Administered 2017-06-28: 20 meq via ORAL
  Filled 2017-06-28: qty 1

## 2017-06-28 MED ORDER — LOPERAMIDE HCL 2 MG PO CAPS
2.0000 mg | ORAL_CAPSULE | Freq: Every day | ORAL | Status: DC
  Filled 2017-06-28: qty 1

## 2017-06-28 MED ORDER — MAGNESIUM SULFATE 4 GM/100ML IV SOLN
4.0000 g | Freq: Once | INTRAVENOUS | Status: AC
  Administered 2017-06-28: 4 g via INTRAVENOUS
  Filled 2017-06-28: qty 100

## 2017-06-28 MED ORDER — METOCLOPRAMIDE HCL 5 MG/ML IJ SOLN
10.0000 mg | Freq: Three times a day (TID) | INTRAMUSCULAR | Status: DC
  Administered 2017-06-28 – 2017-06-29 (×3): 10 mg via INTRAVENOUS
  Filled 2017-06-28 (×3): qty 2

## 2017-06-28 MED ORDER — SODIUM CHLORIDE 4 MEQ/ML IV SOLN
INTRAVENOUS | Status: AC
  Administered 2017-06-28: 17:00:00 via INTRAVENOUS
  Filled 2017-06-28: qty 2760
  Filled 2017-06-28: qty 1000

## 2017-06-28 MED ORDER — ADULT MULTIVITAMIN W/MINERALS CH
1.0000 | ORAL_TABLET | Freq: Every day | ORAL | Status: DC
  Administered 2017-06-28: 1 via ORAL
  Filled 2017-06-28: qty 1

## 2017-06-28 MED ORDER — POTASSIUM CHLORIDE CRYS ER 20 MEQ PO TBCR
20.0000 meq | EXTENDED_RELEASE_TABLET | Freq: Once | ORAL | Status: AC
  Administered 2017-06-28: 20 meq via ORAL
  Filled 2017-06-28: qty 1

## 2017-06-28 MED ORDER — PANTOPRAZOLE SODIUM 40 MG PO TBEC
40.0000 mg | DELAYED_RELEASE_TABLET | Freq: Two times a day (BID) | ORAL | Status: DC
  Administered 2017-06-28 (×2): 40 mg via ORAL
  Filled 2017-06-28 (×2): qty 1

## 2017-06-28 NOTE — Progress Notes (Signed)
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   GERD   Tobacco abuse   COPD (chronic obstructive pulmonary disease) (HCC)   Anemia, iron deficiency   Essential hypertension   Hyperthyroidism   Intra-abdominal adhesions s/p SB resection 05/06/2017   Generalized anxiety disorder   Hypokalemia   Anastomotic leak of intestine   Wound dehiscence, surgical   Aspiration pneumonia (HCC)   Ileus (HCC)   Pressure injury of skin   Hypomagnesemia   Hypothyroidism   ASSESSMENT/PLAN:  OPEN RIGHT ADRENALECTOMY WITH SMALL BOWEL RESECTION AND ANASTOMOSIS - 05/06/2017 - [name] - Additional surgery - 05/13/2017, 05/14/2017, 05/17/2017 and 05/20/2017 involving bowel resection, tube drainage, abdominal closure.  Enterocutaneous fistula Currently with distal ileum in discontinuity and catheter drainage of the 2 bowel ends.  Low volume enteric leak from a separate anastomosis communicating to the anterior abdomen and the VAC dressing.  -Low vol clears only as tolerated -Internal tube drainage at SB discontinuity.  Consider d/c distal tube for long Hartmann -continue VACqMWF -consider drain study of R flank drain since low output - ?maybe remove     Most likely wait 6-12 months from last surgery to improve nutrition, wean off tobacco, and normal activity before attempting ex lap & reanastomosis (has leaked & broken down numerous prior attempts)  - Last CT scan on 08/14 - resolving inflammation & fistulas controlled -   Malnutrition - On TPN; Prealbumin 19.7 (range 18-38) - allow sips of clear liquids & follow output   HTN -lasix diurtics - follow w ECfistula -s/p Pheochromocytoma 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  Adrenal gland, resection, right - PHEOCHROMOCYTOMA, 3.5 CM. - CONFINED TO ADRENAL GLAND. - RESECTION MARGINS  ARE NEGATIVE. - SEE COMMENT. Microscopic Comment . The cells are positive for CD56, chromogranin, and synaptophysin. S100 is weakly positive and stains focal sustentancular cells. Calretinin, inhibin, cytokeratin, and MelanA are negative. According to AJCC TNM staging the tumor is best classified pT1, pNX, pMX. Dr. Saralyn Pilar has reviewed the case. Dr. Kieth Brightly was paged on 05/12/2017.  Anemia - Hgb - 9.1 -restart PO iron - should help constipate  Chronic pain -on PCA - weaning continuous rate.  Longer interval but higher dose PRN.  Maybe wean off next week -continue PO meds as tolerated -switch oxycodone to PRN since "It doesn't do anything" Could consider palliative care consult  Anxiety -Ativan as tolerated - major challenge  FEN: IVF, TPN,  Restart daily K with daily lasix Low Mag - replace  VTE: SCD's, lovenox  Foley: none  Tob abuse: quit smoking to avoid further complications/breakdowns.  Lower nicotine patch to 14g   Follow up: TBD  DISPO: Continue current management.  High complexity of pain, anxiety, fistula management = SDU.   Encourage patient to get up and out of the bed  I updated the patient's status to the patient and nurse.  Recommendations were made.  Questions were answered.  He expressed understanding & appreciation.  45 minutes of review, H&P, planning, consultation.    LOS: 53 days     39 Days Post-Op  Chief Complaint/Subjective:  Tol clears but inc output Some abd cramping SDU RN in room oxycodone not working - prefers PCA to get up, etc Still very anxious Got up yesterday   Objective: Vital signs in last 24 hours: Temp:  [97.6 F (36.4 C)-99 F (37.2 C)] 99 F (37.2 C) (  09/02 0324) Pulse Rate:  [95-110] 98 (09/02 0600) Resp:  [16-20] 20 (09/02 0600) BP: (103-142)/(65-106) 103/65 (09/02 0600) SpO2:  [94 %-99 %] 98 % (09/02 0600) Weight:  [79.9 kg (176 lb 2.4 oz)] 79.9 kg (176 lb 2.4 oz) (09/02 0500) Last BM Date:  06/27/17  Intake/Output from previous day: 09/01 0701 - 09/02 0700 In: 4790.1 [P.O.:200; I.V.:4230.1; IV Piggyback:340] Out: 5188 [Urine:2150; Drains:1135] Intake/Output this shift: No intake/output data recorded.  PE: General: Pt awake/alert/oriented x4 in no major acute distress Eyes: PERRL, normal EOM. Sclera nonicteric Neuro: CN II-XII intact w/o focal sensory/motor deficits. Lymph: No head/neck/groin lymphadenopathy Psych:  No delerium/psychosis/paranoia.  Anxious but consolable HENT: Normocephalic, Mucus membranes moist.  No thrush Neck: Supple, No tracheal deviation Chest: No pain.  Good respiratory excursion. CV:  Pulses intact.  Regular rhythm MS: Normal AROM mjr joints.  No obvious deformity  Abdomen: soft, VAC on right flank wound with small amount of enteric-like drainage, 2 foley tube drains in prox/distal ileum.  R flank surgical drain.  Dressings clean  Ext:  SCDs BLE.  No significant edema.  No cyanosis Skin: No petechiae / purpura  Abdomen- Lab Results:  No results for input(s): WBC, HGB, HCT, PLT in the last 72 hours. BMET  Recent Labs  06/26/17 0533 06/27/17 0748  NA 134* 129*  K 3.5 3.4*  CL 99* 97*  CO2 25 22  GLUCOSE 93 118*  BUN 19 22*  CREATININE 0.67 0.72  CALCIUM 9.2 9.6   PT/INR No results for input(s): LABPROT, INR in the last 72 hours. Comprehensive Metabolic Panel:    Component Value Date/Time   NA 129 (L) 06/27/2017 0748   NA 134 (L) 06/26/2017 0533   K 3.4 (L) 06/27/2017 0748   K 3.5 06/26/2017 0533   CL 97 (L) 06/27/2017 0748   CL 99 (L) 06/26/2017 0533   CO2 22 06/27/2017 0748   CO2 25 06/26/2017 0533   BUN 22 (H) 06/27/2017 0748   BUN 19 06/26/2017 0533   CREATININE 0.72 06/27/2017 0748   CREATININE 0.67 06/26/2017 0533   CREATININE 0.85 07/22/2013 1021   GLUCOSE 118 (H) 06/27/2017 0748   GLUCOSE 93 06/26/2017 0533   CALCIUM 9.6 06/27/2017 0748   CALCIUM 9.2 06/26/2017 0533   AST 24 06/25/2017 1107   AST 26  06/22/2017 0543   ALT 24 06/25/2017 1107   ALT 25 06/22/2017 0543   ALKPHOS 180 (H) 06/25/2017 1107   ALKPHOS 172 (H) 06/22/2017 0543   BILITOT 0.6 06/25/2017 1107   BILITOT 0.8 06/22/2017 0543   PROT 7.0 06/25/2017 1107   PROT 7.0 06/22/2017 0543   ALBUMIN 2.6 (L) 06/25/2017 1107   ALBUMIN 2.7 (L) 06/22/2017 0543     Studies/Results: No results found.  Anti-infectives: Anti-infectives    Start     Dose/Rate Route Frequency Ordered Stop   06/16/17 1200  piperacillin-tazobactam (ZOSYN) IVPB 3.375 g     3.375 g 12.5 mL/hr over 240 Minutes Intravenous Every 8 hours 06/16/17 1026 06/17/17 0004   06/08/17 1200  piperacillin-tazobactam (ZOSYN) IVPB 3.375 g  Status:  Discontinued     3.375 g 12.5 mL/hr over 240 Minutes Intravenous Every 8 hours 06/08/17 1056 06/16/17 1026   05/29/17 2200  ceFEPIme (MAXIPIME) 2 g in dextrose 5 % 50 mL IVPB  Status:  Discontinued     2 g 100 mL/hr over 30 Minutes Intravenous Every 8 hours 05/29/17 2031 06/03/17 0832   05/29/17 2100  metroNIDAZOLE (FLAGYL) IVPB 500 mg  Status:  Discontinued     500 mg 100 mL/hr over 60 Minutes Intravenous Every 8 hours 05/29/17 2029 06/03/17 0832   05/26/17 2200  ceFAZolin (ANCEF) IVPB 1 g/50 mL premix  Status:  Discontinued     1 g 100 mL/hr over 30 Minutes Intravenous Every 8 hours 05/26/17 1008 05/29/17 2023   05/25/17 1100  vancomycin (VANCOCIN) 1,250 mg in sodium chloride 0.9 % 250 mL IVPB  Status:  Discontinued     1,250 mg 166.7 mL/hr over 90 Minutes Intravenous Every 12 hours 05/25/17 0955 05/26/17 0946   05/22/17 1400  anidulafungin (ERAXIS) 100 mg in sodium chloride 0.9 % 100 mL IVPB     100 mg 78 mL/hr over 100 Minutes Intravenous Every 24 hours 05/21/17 1330 05/27/17 1740   05/21/17 1400  anidulafungin (ERAXIS) 200 mg in sodium chloride 0.9 % 200 mL IVPB     200 mg 78 mL/hr over 200 Minutes Intravenous  Once 05/21/17 1330 05/21/17 1940   05/15/17 1000  anidulafungin (ERAXIS) 100 mg in sodium chloride 0.9  % 100 mL IVPB  Status:  Discontinued     100 mg 78 mL/hr over 100 Minutes Intravenous Every 24 hours 05/14/17 0829 05/16/17 0901   05/15/17 1000  metroNIDAZOLE (FLAGYL) IVPB 500 mg  Status:  Discontinued     500 mg 100 mL/hr over 60 Minutes Intravenous Every 8 hours 05/15/17 0903 05/25/17 0938   05/14/17 0900  anidulafungin (ERAXIS) 200 mg in sodium chloride 0.9 % 200 mL IVPB     200 mg 78 mL/hr over 200 Minutes Intravenous  Once 05/14/17 0829 05/14/17 1325   05/13/17 1927  vancomycin (VANCOCIN) 1-5 GM/200ML-% IVPB    Comments:  Ward, Christa   : cabinet override      05/13/17 1927 05/14/17 0744   05/12/17 1400  ceFEPIme (MAXIPIME) 1 g in dextrose 5 % 50 mL IVPB     1 g 100 mL/hr over 30 Minutes Intravenous Every 8 hours 05/12/17 0939 05/26/17 2359   05/12/17 0900  vancomycin (VANCOCIN) IVPB 1000 mg/200 mL premix  Status:  Discontinued     1,000 mg 200 mL/hr over 60 Minutes Intravenous Every 12 hours 05/12/17 0750 05/16/17 0852   05/12/17 0830  aztreonam (AZACTAM) 2 GM IVPB     2 g 100 mL/hr over 30 Minutes Intravenous  Once 05/12/17 0800 05/12/17 0957   05/06/17 0916  vancomycin (VANCOCIN) IVPB 1000 mg/200 mL premix     1,000 mg 200 mL/hr over 60 Minutes Intravenous On call to O.R. 05/06/17 0916 05/06/17 1229       Yonah Tangeman C. 06/28/2017

## 2017-06-28 NOTE — Progress Notes (Signed)
[facility] NOTE   Pharmacy Consult for TPN Indication: prolonged ileus, small bowel leak, multiple bowel surgeries  Patient Measurements: Body mass index is 25.27 kg/m. Filed Weights   06/26/17 0500 06/27/17 0500 06/28/17 0500  Weight: 173 lb 4.5 oz (78.6 kg) 175 lb 11.3 oz (79.7 kg) 176 lb 2.4 oz (79.9 kg)   HPI: 52 yoM admitted on 7/11 for enlarging adrenal mass and planned adrenalectomy.  Pharmacy consulted to dose TPN.  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   7/22 OR:  wash out, tube ileostomy, left with open abdomen & wound vac in place.  Plan for OR on 7/25 for closure 7/25 OR: mesh placed for abdominal closure, wound vac 8/2 extubated, propofol drip off 8/4 TPN off for ~ 5 hours due to line detached from filter 8/10 MD request decrease rate x 1 week 2nd low sodium 8/12 Additional sodium added to TPN due to persistent hyponatremia 8/16 Increasing rate back to goal since sodium has improved with above intervention.   8/20 Prealbumin decreased.  New apparent enteric leak per surgery notes. 8/21 CaPhos product elevated, remove electrolytes from TPN 8/25 PICC did not have blood return in 2 of the 3 ports.  Alteplase intra-catheter and function returned.  TPN infusing correctly on 8/26 per RN. 8/28: d/c SSI 8/30: added electrolytes to TPN 8/31: TPN IV line came out around ~0340 and current bag was stopped at this time.  Restart new clinimix 5/15 1L bag (no lytes since phos is elevated this morning) until next bag is due at [facility]. Will not add MVI and trace elements to this 1L  bag. Use electrolyte free clinimix with new bag at 6PM  Insulin requirements past 24 hours: not on insulin (no hx DM)  Current Nutrition: Clear Liquid Diet (8/21) - boost bid started on 8/22 (pt didn't take any from 8/24-8/26) - pro-stat 30 mL daily started on 8/27  IVF: none  Central access: PICC line ordered 7/19, placed on 7/20 TPN start date: 7/20  ASSESSMENT                                                                                                           Today, 06/28/17  - Glucose (goal <150): at goal -  Electrolytes: K 3.5, Mag low 1.5; Na up slightly to 130 after sodium added to TPN, phos 5.1, CorrCa 10.32; Ca/phos product <55 - Renal:  SCr wnl - LFTs: WNL except alkaline phos elevated- up slightly to 180 - TGs:  296 (7/18), 266 (7/20), 269 (7/23), 210 (7/28), 285 (7/31) 301 (8/3), increased 323 (8/14), 238 (8/20), trending down to 203 (8/27) - Prealbumin:  <5 (7/20), 6.1 (7/24), 10.1 (7/30) 24.8 (8/6), 26.4 (8/13), 15.9 (8/20),  19.7 (8/27)  NUTRITIONAL GOALS                                                                                             RD recs (8/27):  122-138 gms Protein (1.5 - 1.7 g/kg) 2030 - 2270 Kcal  Recalculation: Clinimix 5/15 at rate of 115 ml/hr with lipids 3x/week on MWF only would provide: 138g protein and avg 2164 kcal/day  - Glucose infusion rate will be 3.53 mg/kg/min (Maximum 5 mg/kg/min)   PLAN                                                                                                                         Now:  - Magnesium 4 g IV x1 dose (per MD) - kdur 20 mEq daily started today per MD. Will give an additional 20 mEq to get 40 meq total for today  At 1800 today: ? continune Clinimix 5/15 (Electrolytes free) at 115 ml/hr.   Add sodium to make equivalent to 0.45% NaCl (add 75 mEq of Na/L in a 1:1 OF:BPZWCHE ratio) for hyponatremia ? Lipids at 104mls/hr x 12 hours 3 times weekly on MWF  Patient's taking oral meds. Will take  out MVI and trace elements in TPN bag and start MVI PO daily.  TPN lab panels on Mondays & Thursdays.  Check BMET, Mag & Phos daily  Dia Sitter, PharmD, BCPS 06/28/2017 7:19 AM

## 2017-06-29 ENCOUNTER — Inpatient Hospital Stay (HOSPITAL_COMMUNITY): Payer: Medicare Other

## 2017-06-29 LAB — DIFFERENTIAL
BASOS PCT: 0 %
Basophils Absolute: 0 10*3/uL (ref 0.0–0.1)
EOS ABS: 0.3 10*3/uL (ref 0.0–0.7)
EOS PCT: 1 %
Lymphocytes Relative: 15 %
Lymphs Abs: 3.6 10*3/uL (ref 0.7–4.0)
MONO ABS: 1.7 10*3/uL — AB (ref 0.1–1.0)
Monocytes Relative: 7 %
NEUTROS PCT: 77 %
Neutro Abs: 18 10*3/uL — ABNORMAL HIGH (ref 1.7–7.7)

## 2017-06-29 LAB — CBC
HCT: 33.6 % — ABNORMAL LOW (ref 39.0–52.0)
Hemoglobin: 11.3 g/dL — ABNORMAL LOW (ref 13.0–17.0)
MCH: 25.6 pg — ABNORMAL LOW (ref 26.0–34.0)
MCHC: 33.6 g/dL (ref 30.0–36.0)
MCV: 76.2 fL — ABNORMAL LOW (ref 78.0–100.0)
PLATELETS: 738 10*3/uL — AB (ref 150–400)
RBC: 4.41 MIL/uL (ref 4.22–5.81)
RDW: 16.6 % — AB (ref 11.5–15.5)
WBC: 23.6 10*3/uL — AB (ref 4.0–10.5)

## 2017-06-29 LAB — COMPREHENSIVE METABOLIC PANEL
ALBUMIN: 3.1 g/dL — AB (ref 3.5–5.0)
ALK PHOS: 196 U/L — AB (ref 38–126)
ALT: 27 U/L (ref 17–63)
ANION GAP: 12 (ref 5–15)
AST: 25 U/L (ref 15–41)
BUN: 27 mg/dL — ABNORMAL HIGH (ref 6–20)
CHLORIDE: 97 mmol/L — AB (ref 101–111)
CO2: 22 mmol/L (ref 22–32)
Calcium: 9.5 mg/dL (ref 8.9–10.3)
Creatinine, Ser: 0.66 mg/dL (ref 0.61–1.24)
GFR calc non Af Amer: >60 mL/min (ref >60)
Glucose, Bld: 129 mg/dL — ABNORMAL HIGH (ref 65–99)
POTASSIUM: 3.4 mmol/L — AB (ref 3.5–5.1)
SODIUM: 131 mmol/L — AB (ref 135–145)
Total Bilirubin: 1.1 mg/dL (ref 0.3–1.2)
Total Protein: 7.9 g/dL (ref 6.5–8.1)

## 2017-06-29 LAB — MAGNESIUM: MAGNESIUM: 1.6 mg/dL — AB (ref 1.7–2.4)

## 2017-06-29 LAB — PHOSPHORUS: PHOSPHORUS: 4.4 mg/dL (ref 2.5–4.6)

## 2017-06-29 LAB — TRIGLYCERIDES: Triglycerides: 206 mg/dL — ABNORMAL HIGH (ref <150)

## 2017-06-29 LAB — PREALBUMIN: Prealbumin: 24.7 mg/dL (ref 18–38)

## 2017-06-29 MED ORDER — MAGNESIUM SULFATE 4 GM/100ML IV SOLN: 4.0000 g | Freq: Once | INTRAVENOUS | Status: DC

## 2017-06-29 MED ORDER — FUROSEMIDE 20 MG PO TABS: 20.0000 mg | ORAL_TABLET | Freq: Every day | ORAL | Status: DC

## 2017-06-29 MED ORDER — IOPAMIDOL (ISOVUE-300) INJECTION 61%
100.0000 mL | Freq: Once | INTRAVENOUS | Status: AC | PRN
  Administered 2017-06-29: 100 mL via INTRAVENOUS

## 2017-06-29 MED ORDER — PROMETHAZINE HCL 25 MG/ML IJ SOLN
12.5000 mg | INTRAMUSCULAR | Status: DC | PRN
  Administered 2017-06-30 – 2017-07-10 (×4): 12.5 mg via INTRAVENOUS
  Administered 2017-07-16 – 2017-07-19 (×2): 25 mg via INTRAVENOUS
  Administered 2017-07-23: 12.5 mg via INTRAVENOUS
  Administered 2017-07-27 – 2017-08-04 (×9): 25 mg via INTRAVENOUS
  Filled 2017-06-29 (×17): qty 1

## 2017-06-29 MED ORDER — METOCLOPRAMIDE HCL 5 MG/ML IJ SOLN
10.0000 mg | Freq: Four times a day (QID) | INTRAMUSCULAR | Status: DC
  Administered 2017-06-30 – 2017-07-01 (×7): 10 mg via INTRAVENOUS
  Filled 2017-06-29 (×7): qty 2

## 2017-06-29 MED ORDER — LORAZEPAM 2 MG/ML IJ SOLN
0.5000 mg | Freq: Four times a day (QID) | INTRAMUSCULAR | Status: DC | PRN
  Administered 2017-06-29: 1 mg via INTRAVENOUS
  Administered 2017-06-29 – 2017-06-30 (×2): 2 mg via INTRAVENOUS
  Filled 2017-06-29 (×4): qty 1

## 2017-06-29 MED ORDER — CLINIMIX E/DEXTROSE (5/15) 5 % IV SOLN
INTRAVENOUS | Status: AC
  Administered 2017-06-29: 18:00:00 via INTRAVENOUS
  Filled 2017-06-29 (×2): qty 2760

## 2017-06-29 MED ORDER — IOPAMIDOL (ISOVUE-300) INJECTION 61%
INTRAVENOUS | Status: AC
  Filled 2017-06-29: qty 100

## 2017-06-29 MED ORDER — IOPAMIDOL (ISOVUE-300) INJECTION 61%
15.0000 mL | Freq: Two times a day (BID) | INTRAVENOUS | Status: DC | PRN
  Administered 2017-06-29: 15 mL via ORAL
  Filled 2017-06-29: qty 30

## 2017-06-29 MED ORDER — MAGNESIUM SULFATE 2 GM/50ML IV SOLN
2.0000 g | Freq: Once | INTRAVENOUS | Status: AC
  Administered 2017-06-29: 2 g via INTRAVENOUS
  Filled 2017-06-29: qty 50

## 2017-06-29 MED ORDER — ONDANSETRON HCL 40 MG/20ML IJ SOLN
8.0000 mg | INTRAMUSCULAR | Status: DC | PRN
  Administered 2017-06-30 – 2017-08-14 (×12): 8 mg via INTRAVENOUS
  Filled 2017-06-29 (×15): qty 4

## 2017-06-29 MED ORDER — METOCLOPRAMIDE HCL 5 MG/ML IJ SOLN
10.0000 mg | Freq: Four times a day (QID) | INTRAMUSCULAR | Status: DC | PRN
  Administered 2017-06-29 (×2): 10 mg via INTRAVENOUS
  Filled 2017-06-29 (×2): qty 2

## 2017-06-29 MED ORDER — POTASSIUM CHLORIDE 10 MEQ/100ML IV SOLN
10.0000 meq | INTRAVENOUS | Status: AC
  Administered 2017-06-29 (×4): 10 meq via INTRAVENOUS
  Filled 2017-06-29 (×4): qty 100

## 2017-06-29 MED ORDER — PANTOPRAZOLE SODIUM 40 MG IV SOLR
40.0000 mg | Freq: Two times a day (BID) | INTRAVENOUS | Status: DC
  Administered 2017-06-29 – 2017-08-16 (×97): 40 mg via INTRAVENOUS
  Filled 2017-06-29 (×96): qty 40

## 2017-06-29 MED ORDER — LOPERAMIDE HCL 2 MG PO CAPS
4.0000 mg | ORAL_CAPSULE | Freq: Every day | ORAL | Status: DC
  Filled 2017-06-29: qty 2

## 2017-06-29 MED ORDER — POTASSIUM CHLORIDE CRYS ER 20 MEQ PO TBCR: 20.0000 meq | EXTENDED_RELEASE_TABLET | Freq: Once | ORAL | Status: DC

## 2017-06-29 MED ORDER — METOPROLOL TARTRATE 5 MG/5ML IV SOLN
5.0000 mg | Freq: Four times a day (QID) | INTRAVENOUS | Status: DC | PRN
  Administered 2017-06-29 – 2017-07-01 (×2): 5 mg via INTRAVENOUS
  Filled 2017-06-29 (×2): qty 5

## 2017-06-29 MED ORDER — HYDROMORPHONE 1 MG/ML IV SOLN
INTRAVENOUS | Status: DC
  Administered 2017-06-29: 1.13 mg via INTRAVENOUS
  Administered 2017-06-29: 2.73 mg via INTRAVENOUS
  Administered 2017-06-29: 1.79 mg via INTRAVENOUS
  Administered 2017-06-30: 0.794 mg via INTRAVENOUS
  Administered 2017-06-30: 3.76 mg via INTRAVENOUS

## 2017-06-29 MED ORDER — LORAZEPAM 2 MG/ML PO CONC: 2.0000 mg | Freq: Four times a day (QID) | ORAL | Status: DC | PRN

## 2017-06-29 MED ORDER — FAT EMULSION 20 % IV EMUL
240.0000 mL | INTRAVENOUS | Status: AC
  Administered 2017-06-29: 240 mL via INTRAVENOUS
  Filled 2017-06-29: qty 250

## 2017-06-29 MED ORDER — LABETALOL HCL 100 MG PO TABS: 50.0000 mg | ORAL_TABLET | Freq: Two times a day (BID) | ORAL | Status: DC

## 2017-06-29 MED ORDER — FUROSEMIDE 10 MG/ML IJ SOLN
20.0000 mg | Freq: Every day | INTRAMUSCULAR | Status: DC
  Administered 2017-06-29 – 2017-07-14 (×16): 20 mg via INTRAVENOUS
  Filled 2017-06-29 (×17): qty 2

## 2017-06-29 MED ORDER — ACETAMINOPHEN 325 MG PO TABS: 325.0000 mg | ORAL_TABLET | Freq: Four times a day (QID) | ORAL | Status: DC | PRN

## 2017-06-29 MED ORDER — MAGNESIUM SULFATE 4 GM/100ML IV SOLN
4.0000 g | Freq: Once | INTRAVENOUS | Status: AC
  Administered 2017-06-29: 4 g via INTRAVENOUS
  Filled 2017-06-29 (×2): qty 100

## 2017-06-29 MED ORDER — CHLORPROMAZINE HCL 25 MG/ML IJ SOLN
12.5000 mg | Freq: Four times a day (QID) | INTRAMUSCULAR | Status: DC | PRN
  Filled 2017-06-29 (×2): qty 0.5

## 2017-06-29 MED ORDER — IOPAMIDOL (ISOVUE-300) INJECTION 61%
INTRAVENOUS | Status: AC
  Filled 2017-06-29: qty 30

## 2017-06-29 MED ORDER — ONDANSETRON 4 MG PO TBDP
4.0000 mg | ORAL_TABLET | Freq: Four times a day (QID) | ORAL | Status: DC | PRN
  Administered 2017-06-29 – 2017-06-30 (×2): 4 mg via ORAL
  Filled 2017-06-29 (×5): qty 1

## 2017-06-29 NOTE — Progress Notes (Addendum)
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   GERD   Tobacco abuse   COPD (chronic obstructive pulmonary disease) (HCC)   Anemia, iron deficiency   Essential hypertension   Hyperthyroidism   Intra-abdominal adhesions s/p SB resection 05/06/2017   Generalized anxiety disorder   Hypokalemia   Anastomotic leak of intestine   Wound dehiscence, surgical   Aspiration pneumonia (HCC)   Ileus (HCC)   Pressure injury of skin   Hypomagnesemia   Hypothyroidism  Patient Care Team: Lujean Amel, MD as PCP - General (Family Medicine) Heath Lark, MD as Consulting Physician (Hematology and Oncology) Johnathan Hausen, MD as Consulting Physician (General Surgery) Frederik Pear, MD as Consulting Physician (Orthopedic Surgery) Erline Levine, MD as Consulting Physician (Neurosurgery)   ASSESSMENT/PLAN:  OPEN RIGHT ADRENALECTOMY WITH SMALL BOWEL RESECTION AND ANASTOMOSIS - 05/06/2017 - [name] - Additional surgery - 05/13/2017, 05/14/2017, 05/17/2017 and 05/20/2017 involving bowel resection, tube drainage, abdominal closure.  Enterocutaneous fistula Currently with distal ileum in discontinuity and catheter drainage of the 2 bowel ends.  Low volume enteric leak from a separate anastomosis communicating to the anterior abdomen and the VAC dressing.  -Back down to sips only as tolerated -Internal tube drainage at SB discontinuity.  Consider d/c tubes & switching to Eakin's puch to catch output -continue VACqMWF for incision -consider drain study of R flank drain since low output - ?maybe remove     Most likely wait 6-12 months from last surgery to improve nutrition, wean off tobacco, and normal activity before attempting ex lap & reanastomosis (has leaked & broken down numerous prior attempts)  - Last CT scan on 08/14 - resolving  inflammation & fistulas controlled -   Malnutrition - On TPN; Prealbumin 19.7 (range 18-38) - allow sips only with n/v Sat   HTN -lasix diuretics - follow w ECfistula -restart B blocker with intermittent inc HR with anxiety/pain -s/p Pheochromocytoma 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  Adrenal gland, resection, right - PHEOCHROMOCYTOMA, 3.5 CM. - CONFINED TO ADRENAL GLAND. - RESECTION MARGINS ARE NEGATIVE. - SEE COMMENT. Microscopic Comment . The cells are positive for CD56, chromogranin, and synaptophysin. S100 is weakly positive and stains focal sustentancular cells. Calretinin, inhibin, cytokeratin, and MelanA are negative. According to AJCC TNM staging the tumor is best classified pT1, pNX, pMX. Dr. Saralyn Pilar has reviewed the case. Dr. Kieth Brightly was paged on 05/12/2017.  Anemia - Hgb - 9.1 -hold PO iron with n/v  Chronic pain -on PCA - weaning continuous rate.  Longer interval but higher dose PRN.  Maybe wean off next week -continue PO meds as tolerated -switch oxycodone to PRN since "It doesn't do anything" -Palliative care consult - d/w MD.  He will reach out to Neurosurgery that has helped manage patient's chronic pain in past  Anxiety -Ativan as tolerated - major challenge  FEN: IVF, TPN,  Restart daily K with daily lasix HypoMag - replacing  ID: Inc WBC but no evidence of infection.   Most likely related to stress/anxiety with N/V, but follow Back down to sips New abscess unlikely >5 weeks from last operation.  Consider CT scan if persistent or worsens  VTE: SCD's, lovenox  Foley: none  Tob abuse: quit smoking to avoid further complications/breakdowns.  Lowered nicotine patch to 14g on 9/2  Follow up: TBD  DISPO: Continue current management.  High complexity of pain, anxiety, fistula management =  SDU.   Encourage patient to get up and out of the bed  I updated the patient's status to the patient and nurse.  Recommendations were made.   Questions were answered.  He expressed understanding & appreciation.  30 minutes of review, H&P, planning, consultation.    LOS: 54 days     40 Days Post-Op  Chief Complaint/Subjective: N/V x 1  One fistula tube fell out partially - he pushed back in himself Cannot tolerate many pills, esp iron Some abd cramping SDU RN in room Prefers PCA to get up, etc Still with occasional anxiety   Objective: Vital signs in last 24 hours: Temp:  [97.4 F (36.3 C)-98.9 F (37.2 C)] 97.9 F (36.6 C) (09/03 0323) Pulse Rate:  [92-123] 123 (09/03 0600) Resp:  [16-27] 21 (09/03 0600) BP: (102-137)/(76-91) 108/91 (09/03 0600) SpO2:  [97 %-100 %] 97 % (09/03 0600) Weight:  [82.2 kg (181 lb 3.5 oz)] 82.2 kg (181 lb 3.5 oz) (09/03 0452) Last BM Date: 06/27/17  Intake/Output from previous day: 09/02 0701 - 09/03 0700 In: 2937.3 [P.O.:300; I.V.:2547.3] Out: 2350 [Urine:950; Drains:1400] Intake/Output this shift: No intake/output data recorded.  PE: General: Pt awake/alert/oriented x4 in no major acute distress Eyes: PERRL, normal EOM. Sclera nonicteric Neuro: CN II-XII intact w/o focal sensory/motor deficits. Lymph: No head/neck/groin lymphadenopathy Psych:  No delerium/psychosis/paranoia.  Anxious but consolable HENT: Normocephalic, Mucus membranes moist.  No thrush Neck: Supple, No tracheal deviation Chest: No pain.  Good respiratory excursion. CV:  Pulses intact.  Regular rhythm MS: Normal AROM mjr joints.  No obvious deformity  Abdomen: soft, VAC on right flank wound with small amount of enteric-like drainage, 2 foley tube drains in prox/distal ileum.  R flank surgical drain.  Dressings clean  Ext:  SCDs BLE.  No significant edema.  No cyanosis Skin: No petechiae / purpura  Abdomen- Lab Results:   Recent Labs  06/29/17 0434  WBC 23.6*  HGB 11.3*  HCT 33.6*  PLT 738*   BMET  Recent Labs  06/28/17 0145 06/29/17 0434  NA 130* 131*  K 3.5 3.4*  CL 99* 97*  CO2 25 22   GLUCOSE 110* 129*  BUN 23* 27*  CREATININE 0.69 0.66  CALCIUM 9.2 9.5   PT/INR No results for input(s): LABPROT, INR in the last 72 hours. Comprehensive Metabolic Panel:    Component Value Date/Time   NA 131 (L) 06/29/2017 0434   NA 130 (L) 06/28/2017 0145   K 3.4 (L) 06/29/2017 0434   K 3.5 06/28/2017 0145   CL 97 (L) 06/29/2017 0434   CL 99 (L) 06/28/2017 0145   CO2 22 06/29/2017 0434   CO2 25 06/28/2017 0145   BUN 27 (H) 06/29/2017 0434   BUN 23 (H) 06/28/2017 0145   CREATININE 0.66 06/29/2017 0434   CREATININE 0.69 06/28/2017 0145   CREATININE 0.85 07/22/2013 1021   GLUCOSE 129 (H) 06/29/2017 0434   GLUCOSE 110 (H) 06/28/2017 0145   CALCIUM 9.5 06/29/2017 0434   CALCIUM 9.2 06/28/2017 0145   AST 25 06/29/2017 0434   AST 24 06/25/2017 1107   ALT 27 06/29/2017 0434   ALT 24 06/25/2017 1107   ALKPHOS 196 (H) 06/29/2017 0434   ALKPHOS 180 (H) 06/25/2017 1107   BILITOT 1.1 06/29/2017 0434   BILITOT 0.6 06/25/2017 1107   PROT 7.9 06/29/2017 0434   PROT 7.0 06/25/2017 1107   ALBUMIN 3.1 (L) 06/29/2017 0434   ALBUMIN 2.6 (L) 06/25/2017 1107     Studies/Results: No  results found.  Anti-infectives: Anti-infectives    Start     Dose/Rate Route Frequency Ordered Stop   06/16/17 1200  piperacillin-tazobactam (ZOSYN) IVPB 3.375 g     3.375 g 12.5 mL/hr over 240 Minutes Intravenous Every 8 hours 06/16/17 1026 06/17/17 0004   06/08/17 1200  piperacillin-tazobactam (ZOSYN) IVPB 3.375 g  Status:  Discontinued     3.375 g 12.5 mL/hr over 240 Minutes Intravenous Every 8 hours 06/08/17 1056 06/16/17 1026   05/29/17 2200  ceFEPIme (MAXIPIME) 2 g in dextrose 5 % 50 mL IVPB  Status:  Discontinued     2 g 100 mL/hr over 30 Minutes Intravenous Every 8 hours 05/29/17 2031 06/03/17 0832   05/29/17 2100  metroNIDAZOLE (FLAGYL) IVPB 500 mg  Status:  Discontinued     500 mg 100 mL/hr over 60 Minutes Intravenous Every 8 hours 05/29/17 2029 06/03/17 0832   05/26/17 2200  ceFAZolin  (ANCEF) IVPB 1 g/50 mL premix  Status:  Discontinued     1 g 100 mL/hr over 30 Minutes Intravenous Every 8 hours 05/26/17 1008 05/29/17 2023   05/25/17 1100  vancomycin (VANCOCIN) 1,250 mg in sodium chloride 0.9 % 250 mL IVPB  Status:  Discontinued     1,250 mg 166.7 mL/hr over 90 Minutes Intravenous Every 12 hours 05/25/17 0955 05/26/17 0946   05/22/17 1400  anidulafungin (ERAXIS) 100 mg in sodium chloride 0.9 % 100 mL IVPB     100 mg 78 mL/hr over 100 Minutes Intravenous Every 24 hours 05/21/17 1330 05/27/17 1740   05/21/17 1400  anidulafungin (ERAXIS) 200 mg in sodium chloride 0.9 % 200 mL IVPB     200 mg 78 mL/hr over 200 Minutes Intravenous  Once 05/21/17 1330 05/21/17 1940   05/15/17 1000  anidulafungin (ERAXIS) 100 mg in sodium chloride 0.9 % 100 mL IVPB  Status:  Discontinued     100 mg 78 mL/hr over 100 Minutes Intravenous Every 24 hours 05/14/17 0829 05/16/17 0901   05/15/17 1000  metroNIDAZOLE (FLAGYL) IVPB 500 mg  Status:  Discontinued     500 mg 100 mL/hr over 60 Minutes Intravenous Every 8 hours 05/15/17 0903 05/25/17 0938   05/14/17 0900  anidulafungin (ERAXIS) 200 mg in sodium chloride 0.9 % 200 mL IVPB     200 mg 78 mL/hr over 200 Minutes Intravenous  Once 05/14/17 0829 05/14/17 1325   05/13/17 1927  vancomycin (VANCOCIN) 1-5 GM/200ML-% IVPB    Comments:  Ward, Christa   : cabinet override      05/13/17 1927 05/14/17 0744   05/12/17 1400  ceFEPIme (MAXIPIME) 1 g in dextrose 5 % 50 mL IVPB     1 g 100 mL/hr over 30 Minutes Intravenous Every 8 hours 05/12/17 0939 05/26/17 2359   05/12/17 0900  vancomycin (VANCOCIN) IVPB 1000 mg/200 mL premix  Status:  Discontinued     1,000 mg 200 mL/hr over 60 Minutes Intravenous Every 12 hours 05/12/17 0750 05/16/17 0852   05/12/17 0830  aztreonam (AZACTAM) 2 GM IVPB     2 g 100 mL/hr over 30 Minutes Intravenous  Once 05/12/17 0800 05/12/17 0957   05/06/17 0916  vancomycin (VANCOCIN) IVPB 1000 mg/200 mL premix     1,000 mg 200  mL/hr over 60 Minutes Intravenous On call to O.R. 05/06/17 0916 05/06/17 1229       Ladashia Demarinis C. 06/29/2017

## 2017-06-29 NOTE — Progress Notes (Signed)
Nutrition Follow-up  DOCUMENTATION CODES:   Not applicable  INTERVENTION:  - Continue TPN per Pharmacy. Will monitor for need to start cycling TPN dependent on labs and if plan is made for possible d/c to a facility.  - Will continue to monitor for diet abilities and provide interventions as warranted.   NUTRITION DIAGNOSIS:   Inadequate oral intake related to inability to eat as evidenced by NPO status. -ongoing  GOAL:   Patient will meet greater than or equal to 90% of their needs -met with TPN regimen  MONITOR:   Diet advancement, Weight trends, Labs, Skin, I & O's, Other (Comment) (TPN regimen)  ASSESSMENT:   56 y.o. 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 On 7/18, he developed a leak therefore was taken back to OR for re-do of ex-lap and repair of leak.  Significant events: 7/11 LX:IYHYZ adrenalectomy with complication of intestinal injury and small bowel resection with anastomosis  7/18 ZF:RKYJWV of small bowel anastomotic leak, abdominal closure of wound dehisence 7/19 OR: reopening of recent laparotomy, lysis of adhesions, noted ischemic perforation of new loop of intestine, intact repair of previous anastomotic leak repair, intact previous anastomosis.  7/22 OR: wash out, tube ileostomy, left with open abdomen &wound vac in place. Plan for OR on 7/25 for closure 7/25 OR: mesh placed for abdominal closure, wound vac 8/2extubated, propofol drip off 8/4TPN off for ~ 5 hours due to line detached from filter. Ultrasound-guided aspiration By interventional radiology of small perihepatic fluid collection yielded only 1 mL of the fluid, sent for culture. NPO - mild coffee ground on NG but no active bleed 8/6patient improving. Off Precedex drip since 8/4. Dilaudid Drip being changed over  to PCA. Still with some coffee ground-looking material in NG tube.  8/10 hyponatremia and plan to decrease TPN from 3L/day to 2L/day x1 week 8/24found to have fecal matter in wound vac (EC fistula) 8/14increase in fecal matter presence in wound vac with no plan for surgery at this time 8/16TPN advanced to goal rate of 115 ml/hr with 20% ILE @ 20 mL/hr x12 hours on MWF 8/22: NGT clamped. RD to order Boost Breeze for patient to sip on; Mycostatin started for oral thrush. 9/1: pulled NGT and no plans for replacement. 9/1: developed N/V and only permitted sips of clears, if tolerated.   9/3 Per Dr. Michaell Cowing' note on 9/1, pt had pulled NGT and no plan for replacement and "most likely wait 6-12 months from last surgery to improve nutrition, wean off tobacco, and normal activity before attempting ex lap & reanastomosis (has leaked & broken down numerous prior attempts)." Consideration of drain study of R flank drain with possible plan for removal. Dr. Michaell Cowing' note this AM states sips of clears only, as tolerated, as pt was experiencing N/V on 9/1. Diet order states NPO likely to reflect that pt is not to receive a tray from the kitchen. Weight fluctuations since 8/10: 172-187 lbs.  Pt with triple lumen PICC and continues with Clinimix (E) 5/15 @ 115 mL/hr with 20% ILE @ 20 mL/hr x12 hours on MWF. Electrolytes being added back today as Phos now WDL. This regimen is providing a daily average of 138 grams of protein and 2165 kcal to meet 100% estimated protein and kcal need.   Medications reviewed; 20 mg IV Lasix/day, 20 mg oral Lasix/day,  2 g IV Mg sulfate x1 run today, 4 mg IV Mg sulfate x1 run today, 4 g IV Mg sulfate x1 run yesterday, 10 mg IV Reglan QID PRN (no longer scheduled), 5 mL Mycostatin TID, 40 mg IV Protonix BID, 10 mEq IV KCl x4 runs today, 20 mEq oral KCl x2 doses yesterday and x1 dose today, 250 mg Florastor BID.  Labs reviewed; Na: 131 mmol/L, K: 3.4 mmol/L, Cl: 97 mmol/L, BUN: 27 mg/dL,  Mg: 1.6 mg/dL, Alk Phos elevated and trending up, triglycerides: 203 mg/dL on 8.27.    8/29 - Pt continues on CLD with Boost Breeze BID (accepting ~50% of the time) and Prostat once/day (has accepted both times it was offered).  - He reports drinking most of a Boost Breeze, no other liquids this AM.  - Plans to try a few sips of tea and to eat a jello later in the AM. - Last night he had a few sips of tea and a few spoonfuls of beef broth and did not experience abdominal pain or nausea with these items.  - Talked with pt about oral nutrition supplements and continued to encourage him to consume these items and the ability to use Boost Breeze to take medications.  - Encouraged sips throughout the day of any items on CLD. - No new weight since 8/26. - Spoke with Pharmacist earlier this AM concerning TPN regimen.  - Plan to maintain electrolyte-free formula today and re-assess tomorrow AM; hopefully Phos will drop a little lower.  - Pharmacist would like to wait until pt is transferred to the floor before transitioning to cyclic TPN.  - When that occurs, recommend: Clinimix 5/15/Clinimix E 5/15 @ 50 mL/hr x1 hour, @ 190 mL/hr x14 hours, @ 50 mL/hr x1 hour (16 hours on, 8 hours off; GIR for 190 mL/hr: 1.9).  4 g Mg sulfate x1 run yesterday and x1 run today, 5 mg IV Reglan TID, 10 mEq IV KCl x6 runs today K: 3.1 mmol/L, Mg: 1.5 mg/dL    8/27 - Boost Breeze BID (pt has accepted it 4/10 times offered).  - Will trial Prostat mixed in beverage of choice.  - Weight yesterday exactly the same as weight 8/22 with no weight obtained between those dates or this AM.  - Dr. Earlie Server note from this AM states that pt's condition remains unchanged.  - Spoke with Pharmacist who reports plan to continue current TPN regimen of Clinimix 5/15 @ 115 mL/hr with 20% ILE @ 20 mL/hr x12 hours on MWF as Phos trending down from yesterday but still on the high side of "normal range."   Mg: 1.6 mg/dL, Phos: 4.6  mg/dL, Alk Phos elevated, triglycerides 203 mg/dL today.   **SEE CHART FOR RD NOTES THROUGHOUT ADMISSION    Diet Order:  TPN (CLINIMIX) Adult without lytes TPN (CLINIMIX-E) Adult Diet NPO time specified Except for: Ice Chips  Skin:  R flank and abdominal incisions; abdominal incision with wound vac  Last BM:  9/1  Height:   Ht Readings from Last 1 Encounters:  05/26/17 '5\' 10"'$  (1.778 m)    Weight:   Wt Readings from Last 1 Encounters:  06/29/17 181 lb 3.5 oz (82.2 kg)    Ideal Body Weight:  75.45 kg  BMI:  Body mass index is 26 kg/m.  Estimated Nutritional Needs:   Kcal:  2030-2270 (25-28 kcal/kg)  Protein:  122-138 grams (1.5-1.7 grams/kg)  Fluid:  2 L/day  EDUCATION NEEDS:   No education needs identified at this  time    Jarome Matin, MS, RD, LDN, [facility] Inpatient Clinical Dietitian Pager # [phone] After hours/weekend pager # [phone]

## 2017-06-29 NOTE — Care Management Note (Signed)
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 Will continue to follow patient progress. Discharge Planning: following for needs Expected discharge date: [id number] [name], BSN, [place], [place]  Expected Discharge Date:                  Expected Discharge Plan:  Home/Self Care  In-House Referral:     Discharge planning Services  CM Consult  Post Acute Care Choice:    Choice offered to:     DME Arranged:    DME Agency:     HH Arranged:    HH Agency:     Status of Service:  In process, will continue to follow  If discussed at Long Length of Stay Meetings, dates discussed:    Additional Comments:  Leeroy Cha, RN 06/29/2017, 8:59 AM

## 2017-06-29 NOTE — Consult Note (Signed)
[place] Nurse wound follow up Wound type:Surgical.  Routine change for NPWT dressing. I am assisted in today's dressing change by the patient's bedside RN, Judson Roch. Measurement: 5cm x 18.5cm x 2cm Wound bed: 80% red, moist, 20% with brown, feculent material Drainage (amount, consistency, odor) brown in tubing and in cannister. Strong, sour odor (not feculent) Periwound: intact with maceration noted from 5-7 o'clock.  Dressing procedure/placement/frequency: Dressings removed using medical adhesive releaser spray. One piece of black foam and one piece of white foam removed.  Wound cleansed with NS, and gently patted dry.  One piece of white foam used to cover 90% of wound bed. One piece of black foam used to cover white foam and rest of wound bed.  Drape applied, attached to 168mmHg of continuous negative pressure. An immediate seal is achieved. Patient used PCA X1 during dressing change today. He reports nausea. NGT is out. Next dressing change is Wednesday, July 01, 2017. [place] nursing team will follow, and will remain available to this patient, the nursing, surgical and medical teams.   Thanks, Maudie Flakes, MSN, RN, [place], Arther Abbott  Pager# [phone]

## 2017-06-29 NOTE — Progress Notes (Signed)
Pt with persistent nausea and occasional dry heaves. Pt given medications with little effect. Pt states he may need to NG tube back if he "has another night like last night". Pt refused walk today. Pt refused getting up to char today. Pt educated to continue to change positions in bed to reduce chance of developing pressure sores. Pt verbalized understanding.

## 2017-06-29 NOTE — Progress Notes (Signed)
[place] NOTE   Pharmacy Consult for TPN Indication: prolonged ileus, small bowel leak, multiple bowel surgeries  Patient Measurements: Body mass index is 26 kg/m. Filed Weights   06/27/17 0500 06/28/17 0500 06/29/17 0452  Weight: 175 lb 11.3 oz (79.7 kg) 176 lb 2.4 oz (79.9 kg) 181 lb 3.5 oz (82.2 kg)   HPI: 35 yoM admitted on 7/11 for enlarging adrenal mass and planned adrenalectomy.  Pharmacy consulted to dose TPN.  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   7/22 OR:  wash out, tube ileostomy, left with open abdomen & wound vac in place.  Plan for OR on 7/25 for closure 7/25 OR: mesh placed for abdominal closure, wound vac 8/2 extubated, propofol drip off 8/4 TPN off for ~ 5 hours due to line detached from filter 8/10 MD request decrease rate x 1 week 2nd low sodium 8/12 Additional sodium added to TPN due to persistent hyponatremia 8/16 Increasing rate back to goal since sodium has improved with above intervention.   8/20 Prealbumin decreased.  New apparent enteric leak per surgery notes. 8/21 CaPhos product elevated, remove electrolytes from TPN 8/25 PICC did not have blood return in 2 of the 3 ports.  Alteplase intra-catheter and function returned.  TPN infusing correctly on 8/26 per RN. 8/28: d/c SSI 8/30: added electrolytes to TPN 8/31: TPN IV line came out around ~0340 and current bag was stopped at this time.  Restart new clinimix 5/15 1L bag (no lytes since phos is elevated this morning) until next bag is due at [facility]. Will not add MVI and trace elements to this 1L bag.  Use electrolyte free clinimix with new bag at [facility] 9/3 added electrolytes to TPN  Insulin requirements past 24 hours: not on insulin (no hx DM)  Current Nutrition: Clear Liquid Diet (8/21) - boost bid started on 8/22, decreased to 1/2 container 9/2 - pt refusing - pro-stat 30 mL daily started on 8/27- pt refused 9/2  IVF: none  Central access: PICC line ordered 7/19, placed on 7/20 TPN start date: 7/20  ASSESSMENT                                                                                                           Today, 06/29/17  - Glucose (goal <150): at goal -  Electrolytes: K 3.4, Mag low 1.6; Na 131 after sodium added to TPN, phos 4.4, CorrCa 10.22; Ca/phos product <55- no electrolytes in TPN - Renal:  SCr wnl - LFTs: WNL except alkaline phos elevated- 196 - TGs:  296 (7/18), 266 (7/20), 269 (7/23), 210 (7/28), 285 (7/31) 301 (8/3), increased 323 (8/14), 238 (8/20), trending down to 203 (8/27) - Prealbumin:  <5 (7/20), 6.1 (7/24),  10.1 (7/30) 24.8 (8/6), 26.4 (8/13), 15.9 (8/20), 19.7 (8/27)  NUTRITIONAL GOALS                                                                                             RD recs (8/27):  122-138 gms Protein (1.5 - 1.7 g/kg) 2030 - 2270 Kcal  Recalculation: Clinimix 5/15 at rate of 115 ml/hr with lipids 3x/week on MWF only would provide: 138g protein and avg 2164 kcal/day  - Glucose infusion rate will be 3.53 mg/kg/min (Maximum 5 mg/kg/min)   PLAN                                                                                                            Now:  - Magnesium 6 g IV x1 dose ( 4 gm followed by 2 gm for total of 6 gm) - kdur 20 mEq daily per MD. Will give an additional 20 mEq to get 40 meq total for today  At 1800 today: ? change Clinimix 5/15 (Electrolytes free) to Clinimix E 5/15 (with standard electrolytes)  at 115 ml/hr.  ? Lipids at 66mls/hr x 12 hours 3 times weekly on MWF  Patient's taking oral meds.  MVI PO daily.  TPN lab  panels on Mondays & Thursdays.  Check BMET, Mag & Phos daily  Eudelia Bunch, Pharm.D. [phone] 06/29/2017 7:36 AM

## 2017-06-29 NOTE — Progress Notes (Signed)
Pt continues with N/V. Zofran, phenergan, reglan, benadryl given to pt with minimal to no relief. Ativan given. NG tube offered to pt. Pt refused and requested CT scan. MD notified. CT scan ordered. Thorazine offered. Pt refused due to confusion with prior administration. Will continue to monitor.

## 2017-06-30 DIAGNOSIS — K632 Fistula of intestine: Secondary | ICD-10-CM

## 2017-06-30 LAB — BASIC METABOLIC PANEL
ANION GAP: 11 (ref 5–15)
BUN: 34 mg/dL — ABNORMAL HIGH (ref 6–20)
CHLORIDE: 95 mmol/L — AB (ref 101–111)
CO2: 25 mmol/L (ref 22–32)
CREATININE: 0.83 mg/dL (ref 0.61–1.24)
Calcium: 9.9 mg/dL (ref 8.9–10.3)
GFR calc non Af Amer: >60 mL/min (ref >60)
Glucose, Bld: 127 mg/dL — ABNORMAL HIGH (ref 65–99)
Potassium: 4.1 mmol/L (ref 3.5–5.1)
SODIUM: 131 mmol/L — AB (ref 135–145)

## 2017-06-30 LAB — MAGNESIUM: Magnesium: 2 mg/dL (ref 1.7–2.4)

## 2017-06-30 LAB — PHOSPHORUS: Phosphorus: 6.5 mg/dL — ABNORMAL HIGH (ref 2.5–4.6)

## 2017-06-30 MED ORDER — DIAZEPAM 5 MG/ML IJ SOLN
5.0000 mg | Freq: Two times a day (BID) | INTRAMUSCULAR | Status: DC
  Administered 2017-06-30 – 2017-08-01 (×27): 5 mg via INTRAVENOUS
  Filled 2017-06-30 (×40): qty 2

## 2017-06-30 MED ORDER — OLANZAPINE 5 MG PO TBDP
5.0000 mg | ORAL_TABLET | Freq: Every day | ORAL | Status: DC
  Administered 2017-06-30 – 2017-08-02 (×12): 5 mg via ORAL
  Filled 2017-06-30 (×49): qty 1

## 2017-06-30 MED ORDER — FENTANYL 50 MCG/HR TD PT72
50.0000 ug | MEDICATED_PATCH | TRANSDERMAL | Status: DC
  Administered 2017-06-30: 50 ug via TRANSDERMAL
  Filled 2017-06-30: qty 1

## 2017-06-30 MED ORDER — LORAZEPAM 2 MG/ML IJ SOLN
1.0000 mg | INTRAMUSCULAR | Status: DC | PRN
Start: 2017-06-30 — End: 2017-08-01
  Administered 2017-06-30 – 2017-08-01 (×78): 1 mg via INTRAVENOUS
  Filled 2017-06-30 (×82): qty 1

## 2017-06-30 MED ORDER — TRACE MINERALS CR-CU-MN-SE-ZN 10-1000-500-60 MCG/ML IV SOLN
INTRAVENOUS | Status: AC
  Administered 2017-06-30: 18:00:00 via INTRAVENOUS
  Filled 2017-06-30: qty 2000

## 2017-06-30 MED ORDER — FENTANYL CITRATE (PF) 100 MCG/2ML IJ SOLN
25.0000 ug | INTRAMUSCULAR | Status: DC | PRN
  Administered 2017-06-30 – 2017-07-01 (×4): 25 ug via INTRAVENOUS
  Filled 2017-06-30 (×4): qty 2

## 2017-06-30 MED ORDER — ONDANSETRON HCL 4 MG/2ML IJ SOLN
4.0000 mg | Freq: Three times a day (TID) | INTRAMUSCULAR | Status: DC
  Administered 2017-06-30 – 2017-08-01 (×91): 4 mg via INTRAVENOUS
  Filled 2017-06-30 (×96): qty 2

## 2017-06-30 MED ORDER — MAGNESIUM SULFATE 2 GM/50ML IV SOLN
2.0000 g | Freq: Once | INTRAVENOUS | Status: AC
  Administered 2017-06-30: 2 g via INTRAVENOUS
  Filled 2017-06-30: qty 50

## 2017-06-30 NOTE — Progress Notes (Signed)
Patient ID: [name] [name], male   DOB: 09-16-1961, 56 y.o.   MRN: [id number] [facility] Surgery Progress Note:   41 Days Post-Op  Subjective: Mental status is depressed but he is talkative and we discussed his frustrations.  I discussed with his sister and brother at length.  I reviewed his CT scan with them.   Objective: Vital signs in last 24 hours: Temp:  [97.9 F (36.6 C)-98.8 F (37.1 C)] 97.9 F (36.6 C) (09/04 1600) Pulse Rate:  [102-111] 111 (09/04 1300) Resp:  [18-35] 27 (09/04 1600) BP: (95-123)/(65-91) 95/73 (09/04 1200) SpO2:  [97 %-100 %] 99 % (09/04 1600) Weight:  [84.2 kg (185 lb 10 oz)] 84.2 kg (185 lb 10 oz) (09/04 0342)  Intake/Output from previous day: 09/03 0701 - 09/04 0700 In: 1613.5 [I.V.:1553.5] Out: 2960 [Drains:2960] Intake/Output this shift: Total I/O In: 2300 [I.V.:2300] Out: 550 [Urine:500; Drains:50]  Physical Exam: Work of breathing is normal.  Abdomen is flat;  Proximal Foley is putting out.    Lab Results:  Results for orders placed or performed during the hospital encounter of 05/06/17 (from the past 48 hour(s))  Magnesium     Status: Abnormal   Collection Time: 06/29/17  4:34 AM  Result Value Ref Range   Magnesium 1.6 (L) 1.7 - 2.4 mg/dL  Phosphorus     Status: None   Collection Time: 06/29/17  4:34 AM  Result Value Ref Range   Phosphorus 4.4 2.5 - 4.6 mg/dL  Differential     Status: Abnormal   Collection Time: 06/29/17  4:34 AM  Result Value Ref Range   Neutrophils Relative % 77 %   Neutro Abs 18.0 (H) 1.7 - 7.7 K/uL   Lymphocytes Relative 15 %   Lymphs Abs 3.6 0.7 - 4.0 K/uL   Monocytes Relative 7 %   Monocytes Absolute 1.7 (H) 0.1 - 1.0 K/uL   Eosinophils Relative 1 %   Eosinophils Absolute 0.3 0.0 - 0.7 K/uL   Basophils Relative 0 %   Basophils Absolute 0.0 0.0 - 0.1 K/uL  Comprehensive metabolic panel     Status: Abnormal   Collection Time: 06/29/17  4:34 AM  Result Value Ref Range   Sodium 131 (L) 135 - 145 mmol/L    Potassium 3.4 (L) 3.5 - 5.1 mmol/L   Chloride 97 (L) 101 - 111 mmol/L   CO2 22 22 - 32 mmol/L   Glucose, Bld 129 (H) 65 - 99 mg/dL   BUN 27 (H) 6 - 20 mg/dL   Creatinine, Ser 0.66 0.61 - 1.24 mg/dL   Calcium 9.5 8.9 - 10.3 mg/dL   Total Protein 7.9 6.5 - 8.1 g/dL   Albumin 3.1 (L) 3.5 - 5.0 g/dL   AST 25 15 - 41 U/L   ALT 27 17 - 63 U/L   Alkaline Phosphatase 196 (H) 38 - 126 U/L   Total Bilirubin 1.1 0.3 - 1.2 mg/dL   GFR calc non Af Amer >60 >60 mL/min   GFR calc Af Amer >60 >60 mL/min    Comment: (NOTE) The eGFR has been calculated using the CKD EPI equation. This calculation has not been validated in all clinical situations. eGFR's persistently <60 mL/min signify possible Chronic Kidney Disease.    Anion gap 12 5 - 15  CBC     Status: Abnormal   Collection Time: 06/29/17  4:34 AM  Result Value Ref Range   WBC 23.6 (H) 4.0 - 10.5 K/uL   RBC 4.41 4.22 - 5.81  MIL/uL   Hemoglobin 11.3 (L) 13.0 - 17.0 g/dL   HCT 33.6 (L) 39.0 - 52.0 %   MCV 76.2 (L) 78.0 - 100.0 fL   MCH 25.6 (L) 26.0 - 34.0 pg   MCHC 33.6 30.0 - 36.0 g/dL   RDW 16.6 (H) 11.5 - 15.5 %   Platelets 738 (H) 150 - 400 K/uL  Prealbumin     Status: None   Collection Time: 06/29/17  7:15 AM  Result Value Ref Range   Prealbumin 24.7 18 - 38 mg/dL    Comment: Performed at [facility], [place] [address] [place], [place] [address]  Triglycerides     Status: Abnormal   Collection Time: 06/29/17  7:15 AM  Result Value Ref Range   Triglycerides 206 (H) <150 mg/dL    Comment: Performed at [facility] [address] [place], [place] [address]  Magnesium     Status: None   Collection Time: 06/30/17  5:00 AM  Result Value Ref Range   Magnesium 2.0 1.7 - 2.4 mg/dL  Phosphorus     Status: Abnormal   Collection Time: 06/30/17  5:00 AM  Result Value Ref Range   Phosphorus 6.5 (H) 2.5 - 4.6 mg/dL  Basic metabolic panel     Status: Abnormal   Collection Time: 06/30/17  5:00 AM  Result Value Ref Range    Sodium 131 (L) 135 - 145 mmol/L   Potassium 4.1 3.5 - 5.1 mmol/L    Comment: DELTA CHECK NOTED REPEATED TO VERIFY NO VISIBLE HEMOLYSIS    Chloride 95 (L) 101 - 111 mmol/L   CO2 25 22 - 32 mmol/L   Glucose, Bld 127 (H) 65 - 99 mg/dL   BUN 34 (H) 6 - 20 mg/dL   Creatinine, Ser 0.83 0.61 - 1.24 mg/dL   Calcium 9.9 8.9 - 10.3 mg/dL   GFR calc non Af Amer >60 >60 mL/min   GFR calc Af Amer >60 >60 mL/min    Comment: (NOTE) The eGFR has been calculated using the CKD EPI equation. This calculation has not been validated in all clinical situations. eGFR's persistently <60 mL/min signify possible Chronic Kidney Disease.    Anion gap 11 5 - 15    Radiology/Results: Ct Abdomen Pelvis W Contrast  Result Date: 06/29/2017 CLINICAL DATA:  56 year old male with recent right adrenalectomy for pheochromocytoma, small bowel resection and enterocutaneous fistula, currently with distal ileum in discontinuity and catheter drainage of the 2 bowel ends. Low volume enteric leak from separate anastomosis communicating to the anterior abdomen and the VAC dressing. EXAM: CT ABDOMEN AND PELVIS WITH CONTRAST TECHNIQUE: Multidetector CT imaging of the abdomen and pelvis was performed using the standard protocol following bolus administration of intravenous contrast. CONTRAST:  100 cc intravenous Isovue-300 COMPARISON:  06/09/2017 and prior CTs FINDINGS: Lower chest: Mild bibasilar atelectasis again noted. Hepatobiliary: The liver and gallbladder are unremarkable. No biliary dilatation. Pancreas: Unremarkable Spleen: Unremarkable Adrenals/Urinary Tract: Right adrenalectomy changes noted. The left adrenal gland is unremarkable. The kidneys and bladder are unremarkable. Stomach/Bowel: Right colectomy, small bowel resection and postoperative changes within the abdomen are again identified. Two percutaneous enteric catheters are noted within small bowel. Minimally distended fluid-filled loops of small bowel within the left  abdomen are noted without a discrete transition point and probably represents a mild focal ileus. No other dilated bowel loops are present. Anterior open right abdominal wound is noted with a percutaneous abdominal drain within the anterior abdomen. A thick walled linear  collection along the anterior liver has slightly decreased in size. There is no evidence of pneumoperitoneum. Vascular/Lymphatic: Aortic atherosclerosis. No enlarged abdominal or pelvic lymph nodes. Reproductive: Prostate enlargement again noted Other: No free fluid or new focal collection Musculoskeletal: No acute abnormalities IMPRESSION: 1. Bowel postoperative changes again identified with mildly distended fluid filled small bowel loops in the left abdomen -suspect mild focal ileus. 2. Slightly smaller thick walled linear collection anterior to the liver. 3. No evidence of pneumoperitoneum or new fluid collection. 4.  Aortic Atherosclerosis (ICD10-I70.0). Electronically Signed   By: Margarette Canada M.D.   On: 06/29/2017 20:52    Anti-infectives: Anti-infectives    Start     Dose/Rate Route Frequency Ordered Stop   06/16/17 1200  piperacillin-tazobactam (ZOSYN) IVPB 3.375 g     3.375 g 12.5 mL/hr over 240 Minutes Intravenous Every 8 hours 06/16/17 1026 06/17/17 0004   06/08/17 1200  piperacillin-tazobactam (ZOSYN) IVPB 3.375 g  Status:  Discontinued     3.375 g 12.5 mL/hr over 240 Minutes Intravenous Every 8 hours 06/08/17 1056 06/16/17 1026   05/29/17 2200  ceFEPIme (MAXIPIME) 2 g in dextrose 5 % 50 mL IVPB  Status:  Discontinued     2 g 100 mL/hr over 30 Minutes Intravenous Every 8 hours 05/29/17 2031 06/03/17 0832   05/29/17 2100  metroNIDAZOLE (FLAGYL) IVPB 500 mg  Status:  Discontinued     500 mg 100 mL/hr over 60 Minutes Intravenous Every 8 hours 05/29/17 2029 06/03/17 0832   05/26/17 2200  ceFAZolin (ANCEF) IVPB 1 g/50 mL premix  Status:  Discontinued     1 g 100 mL/hr over 30 Minutes Intravenous Every 8 hours 05/26/17 1008  05/29/17 2023   05/25/17 1100  vancomycin (VANCOCIN) 1,250 mg in sodium chloride 0.9 % 250 mL IVPB  Status:  Discontinued     1,250 mg 166.7 mL/hr over 90 Minutes Intravenous Every 12 hours 05/25/17 0955 05/26/17 0946   05/22/17 1400  anidulafungin (ERAXIS) 100 mg in sodium chloride 0.9 % 100 mL IVPB     100 mg 78 mL/hr over 100 Minutes Intravenous Every 24 hours 05/21/17 1330 05/27/17 1740   05/21/17 1400  anidulafungin (ERAXIS) 200 mg in sodium chloride 0.9 % 200 mL IVPB     200 mg 78 mL/hr over 200 Minutes Intravenous  Once 05/21/17 1330 05/21/17 1940   05/15/17 1000  anidulafungin (ERAXIS) 100 mg in sodium chloride 0.9 % 100 mL IVPB  Status:  Discontinued     100 mg 78 mL/hr over 100 Minutes Intravenous Every 24 hours 05/14/17 0829 05/16/17 0901   05/15/17 1000  metroNIDAZOLE (FLAGYL) IVPB 500 mg  Status:  Discontinued     500 mg 100 mL/hr over 60 Minutes Intravenous Every 8 hours 05/15/17 0903 05/25/17 0938   05/14/17 0900  anidulafungin (ERAXIS) 200 mg in sodium chloride 0.9 % 200 mL IVPB     200 mg 78 mL/hr over 200 Minutes Intravenous  Once 05/14/17 0829 05/14/17 1325   05/13/17 1927  vancomycin (VANCOCIN) 1-5 GM/200ML-% IVPB    Comments:  Ward, Christa   : cabinet override      05/13/17 1927 05/14/17 0744   05/12/17 1400  ceFEPIme (MAXIPIME) 1 g in dextrose 5 % 50 mL IVPB     1 g 100 mL/hr over 30 Minutes Intravenous Every 8 hours 05/12/17 0939 05/26/17 2359   05/12/17 0900  vancomycin (VANCOCIN) IVPB 1000 mg/200 mL premix  Status:  Discontinued     1,000 mg  200 mL/hr over 60 Minutes Intravenous Every 12 hours 05/12/17 0750 05/16/17 0852   05/12/17 0830  aztreonam (AZACTAM) 2 GM IVPB     2 g 100 mL/hr over 30 Minutes Intravenous  Once 05/12/17 0800 05/12/17 0957   05/06/17 0916  vancomycin (VANCOCIN) IVPB 1000 mg/200 mL premix     1,000 mg 200 mL/hr over 60 Minutes Intravenous On call to O.R. 05/06/17 0916 05/06/17 1229      Assessment/Plan: Problem List: Patient  Active Problem List   Diagnosis Date Noted  . Hypomagnesemia 06/28/2017  . Hypothyroidism   . 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 Protein-calorie malnutrition, severe ([place]) 06/27/2017  . 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 Pressure injury of skin 05/29/2017  . Ileus ([place])   . Hypokalemia 05/14/2017  . Anastomotic leak of intestine 05/14/2017  . Wound dehiscence, surgical 05/14/2017  . Aspiration pneumonia ([place]) 05/14/2017  . Chronic narcotic use 05/10/2017  . Generalized anxiety disorder 05/10/2017  . Intra-abdominal adhesions s/p SB resection 05/06/2017 05/09/2017  . Throat symptom 03/18/2016  . Multinodular goiter 01/19/2016  . Hyperthyroidism 01/19/2016  . Essential hypertension   . Diarrhea 11/30/2014  . Anemia, iron deficiency 11/01/2014  . Leukocytosis 11/03/2013  . Tobacco abuse 07/26/2013  . COPD (chronic obstructive pulmonary disease) ([place]) 07/26/2013  . Left knee pain 07/26/2013  . Pain in joint, shoulder region 05/03/2013  . GERD 07/24/2008  . 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    Will try medications to stabilize nausea.  May need NG if this doesn't work.  I told him that I would consider surgery on him later this month (2 months after his prior surgery).   41 Days Post-Op    LOS: 55 days   Matt B. Hassell Done, MD, [facility] [phone] beeper [phone]  06/30/2017 6:07 PM

## 2017-06-30 NOTE — Consult Note (Addendum)
Palliative Medicine consulted for SYMPTOM MANAGEMENT.  56 yo s/p adrenalectomy-Complicated history-but hospitalization has been long and he is getting increasingly frustrated, anxious and agitated.He was treated for chronic cervical radiculopathy prior to admission with 3-4 10mg  oxycodone a day along with neurontin and other adjuvant pain medications.   Assessment: Prolonged illness and hospitalization- ongoing issues with refractory nausea, pain and existential suffering.  Recommendations:  Because of the multiple anti-anxiety/antinausea medications he is receiving, along with risk for Mg depletion we should check an EKG to make sure he does not have QT prolongation.  1. Stop Imodium, may contribute to bowel dysmotility and ileus. No having Diarrhea, possibly helpful for high output ileostomy but may have unwanted side effects on bowel.  Consider a trial of octreotide will help with decreasing small bowel and gastric secretions and with nausea.  2. Persistent Nausea: Complex nausea with aspects of mechanoreceptor induced features,toxic CTZ triggers and cholinergic from high levels of anxiety. It may need multiple levels of control- but in general I think he would benefit from scheduled Reglan, Xyprexa (ODT if he will take it-otherwise can use IV Haldol but probably not as effective), and zofran. Schedule these ATC and can use Phenergan as a PRN. For anxiety of to continue using the lorazepam.  3. Pain: Stop PCA. He clearly appears to be suffering - it is generalized, he is not making good eye contact but he did seem to engage me when I offered a new plan for his symptom control. He is probably having Dilaudid induced hyperalgesia- he reports that when he hits the PCA button his pain actually gets worse.  Opioid induced hyperalgesia  Opioid induced bowel dysmotility/ileus-persistent  He is receiving baseline 8mg  of hydromorphone daily (scheduled)- he has not hit his button- He will feel better  off the PCA without the CO2 monitoring etc..Since his bowel issues may go one for months I think a fentanyl patch would be best way to go 78mh hydromorphone = 27mcg Fentanyl patch-will start at this dose since he is getting PRN's on top of this as well. Will add on an IV fentanyl PRN as well.   4. Ritta Slot- this can cause nausea if in the esophagus as well as regurgitation and vomiting- would stop nystatin and MMW and use fluconazole.  Will ask Chaplain to visit- there may be ways we help with his pain by addressing existential suffering.  Appreciate this consult for symptom management. Will follow for adjustments and other needs.  Time: 70 minutes Greater than 50%  of this time was spent counseling and coordinating care related to the above assessment and plan.   Lane Hacker, DO Palliative Medicine [phone]

## 2017-06-30 NOTE — Progress Notes (Addendum)
Physical Therapy Treatment Patient Details Name: [name] [name] MRN: [id number] DOB: 1961/02/03 Today's Date: 06/30/2017    History of Present Illness  56 y.o. 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 On 7/18, he developed a leak therefore was taken back to OR for re-do of ex-lap and repair of leak on 05/20/17.  He returned to the ICU on the vent . Extubated on 05/28/17.     PT Comments    The patient complained of nausea. Did ambulate x 56'. Continue PT. Patient can benefit from a K Pad-heating pad.  Follow Up Recommendations  SNF;Supervision/Assistance - 24 hour     Equipment Recommendations  Rolling walker with 5" wheels    Recommendations for Other Services       Precautions / Restrictions Precautions Precautions: Fall Precaution Comments: multiple lines in abdomen, VAC, , and JP drain    Mobility  Bed Mobility   Bed Mobility: Supine to Sit   Sidelying to sit: Min assist       General bed mobility comments: assist for lines and light min A to sit up  Transfers Overall transfer level: Needs assistance Equipment used: Rolling walker (2 wheeled) Transfers: Sit to/from Stand Sit to Stand: Min guard;+2 safety/equipment;Min assist            Ambulation/Gait Ambulation/Gait assistance: Min assist;+2 safety/equipment Ambulation Distance (Feet): 56 Feet Assistive device: Rolling walker (2 wheeled) Gait Pattern/deviations: Step-to pattern;Step-through pattern     General Gait Details: slow pace. HR 114-137. Sats 100% RA   Stairs            Wheelchair Mobility    Modified Rankin (Stroke Patients Only)       Balance                                            Cognition Arousal/Alertness: Awake/alert Behavior During Therapy: WFL for tasks  assessed/performed                                          Exercises General Exercises - Lower Extremity Ankle Circles/Pumps: AROM;Both;5 reps Quad Sets: AROM;Both;5 reps    General Comments        Pertinent Vitals/Pain Faces Pain Scale: Hurts little more Pain Location: R side back  Pain Descriptors / Indicators: Sore;Grimacing Pain Intervention(s): Monitored during session;Premedicated before session;Repositioned    Home Living                      Prior Function            PT Goals (current goals can now be found in the care plan section) Acute Rehab PT Goals Time For Goal Achievement: 07/14/17 Progress towards PT goals:  (goals updated)    Frequency    Min 3X/week      PT Plan Current plan remains appropriate    Co-evaluation              AM-PAC PT "6 Clicks" Daily Activity  Outcome Measure  Difficulty turning over in bed (including adjusting bedclothes, sheets and blankets)?: A Lot Difficulty moving from lying on back to  sitting on the side of the bed? : A Lot Difficulty sitting down on and standing up from a chair with arms (e.g., wheelchair, bedside commode, etc,.)?: A Lot Help needed moving to and from a bed to chair (including a wheelchair)?: A Little Help needed walking in hospital room?: A Little Help needed climbing 3-5 steps with a railing? : Total 6 Click Score: 13    End of Session   Activity Tolerance: Patient tolerated treatment well Patient left: in chair;with call bell/phone within reach;with family/visitor present Nurse Communication: Mobility status PT Visit Diagnosis: Difficulty in walking, not elsewhere classified (R26.2);Muscle weakness (generalized) (M62.81)     Time: 3491-7915 PT Time Calculation (min) (ACUTE ONLY): 22 min  Charges:  $Gait Training: 8-22 mins                    G CodesTresa Endo PT [phone] e}   [name] 06/30/2017, 3:13 PM

## 2017-06-30 NOTE — Progress Notes (Addendum)
[facility] NOTE   Pharmacy Consult for TPN Indication: prolonged ileus, small bowel leak, multiple bowel surgeries  Patient Measurements: Body mass index is 26.63 kg/m. Filed Weights   06/28/17 0500 06/29/17 0452 06/30/17 0342  Weight: 176 lb 2.4 oz (79.9 kg) 181 lb 3.5 oz (82.2 kg) 185 lb 10 oz (84.2 kg)   HPI: 89 yoM admitted on 7/11 for enlarging adrenal mass and planned adrenalectomy.  Pharmacy consulted to dose TPN.  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   7/22 OR:  wash out, tube ileostomy, left with open abdomen & wound vac in place.  Plan for OR on 7/25 for closure 7/25 OR: mesh placed for abdominal closure, wound vac 8/2 extubated, propofol drip off 8/4 TPN off for ~ 5 hours due to line detached from filter 8/10 MD request decrease rate x 1 week 2nd low sodium 8/12 Additional sodium added to TPN due to persistent hyponatremia 8/16 Increasing rate back to goal since sodium has improved with above intervention.   8/20 Prealbumin decreased.  New apparent enteric leak per surgery notes. 8/21 CaPhos product elevated, remove electrolytes from TPN 8/25 PICC did not have blood return in 2 of the 3 ports.  Alteplase intra-catheter and function returned.  TPN infusing correctly on 8/26 per RN. 8/28: d/c SSI 8/30: added electrolytes to TPN 8/31: TPN IV line came out around ~0340 and current bag was stopped at this time.  Restart new clinimix 5/15 1L bag (no lytes since phos is elevated this morning) until next bag is due at [facility]. Will not add MVI and trace elements to this 1L bag.  Use electrolyte free clinimix with new bag at [facility] 9/1 NGT out 9/3 added electrolytes to TPN 9/4 lytes out of TPN  Insulin requirements past 24 hours: not on insulin (no hx DM)  Current Nutrition: Clear Liquid Diet (8/21) - boost bid started on 8/22, decreased to 1/2 container 9/2, dc'd 9/3 - pro-stat 30 mL daily started on 8/27- pt refused 9/2, 9/3  IVF: none. Lasix 20 IV qday  Central access: PICC line ordered 7/19, placed on 7/20 TPN start date: 7/20  ASSESSMENT                                                                                                           Today, 06/30/17  - Glucose (goal <150): at goal -  Electrolytes: K 4.1 after 4 runs and lytes added back to TPN, Mag 2.0 after 6 gm bolus,  Na 131, phos 4.4>6.5, CorrCa 10.62, Ca/phos product = 69, goal is < 55- after electrolytes added back to TPN 9/3 - Renal:  SCr wnl - LFTs: WNL except alkaline phos elevated- 196 - TGs:  296 (  7/18), 266 (7/20), 269 (7/23), 210 (7/28), 285 (7/31) 301 (8/3), increased 323 (8/14), 238 (8/20), 203(8/27), 206 (9/3) - Prealbumin:  <5 (7/20), 6.1 (7/24), 10.1 (7/30) 24.8 (8/6), 26.4 (8/13), 15.9 (8/20), 19.7 (8/27) 24.7 (9/3)- within normal range -pt with persistent N, dry heaves, zofran, phenergan, reglan, benadryl given w/ minimal relief, on IV PPI BID, scheduled reglan, NGT offered, CT scan 9/3 PM - mild focal ileus  NUTRITIONAL GOALS                                                                                             RD recs (9/3):  122-138 gms Protein (1.5 - 1.7 g/kg) 2030 - 2270 Kcal  Recalculation: Clinimix 5/15 at rate of 115 ml/hr with lipids 3x/week on MWF only would provide: 138g protein and avg 2164 kcal/day  - Glucose infusion rate will be 3.53 mg/kg/min (Maximum 5 mg/kg/min)   PLAN                                                                                                            Now:  Magnesium 2 gm- because taking lytes out of TPN anticipate it will drop  again  At 1800 today: ? change Clinimix E 5/15 (with standard electrolytes) back to Clinimix 5/15 (No electrolytes)  at 115 ml/hr due to elevated calcium phos product  Add sodium to TPN  (add 100 mEq of Na/L in a 1:1 VO:HYWVPXT ratio) for hyponatremia ? Lipids at 86mls/hr x 12 hours 3 times weekly on MWF  PO MVI>>MVI/TE in TPN  TPN lab panels on Mondays & Thursdays.  Check BMET, Mag & Phos daily  adding pepcid 40 mg/L - d/w Brooke, CCS PA  Eudelia Bunch, Pharm.D. [phone] 06/30/2017 7:21 AM

## 2017-06-30 NOTE — Progress Notes (Signed)
Patient with worsening nausea or retching and anxiety despite numerous IV medications. Put him back on scheduled Reglan for now since he felt that helped in the past. Refused nasogastric tube. Increased output from his tubes in his entertaining his fistula.  Again discussed with intensive care unit nurse that that is expected for someone with a proximal ileostomy of sorts with this EC fistula.  Expected some dumping given his prior by mouth intake without much output the first day.  Suspect he is cleaning himself out somewhat  Patient and family insisting on CAT scan given his history of prior abdominal catastrophes.  This was ordered.  He cannot tolerate oral contrast.  CT scan reviewed by myself and reported by radiology.  It is reassuring that there are no new issues.  Perhaps less inflammation overall.  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  New fluid collection or perforation.  Small bowel mildly more dilated than on last CAT scan with NG tube in place, but no evidence of any blockage.  Consistent with his by mouth clear liquid  intake over the past 48 hours.  Drains in correct location.  I called relay this to the intensive care unit nurse to let the patient and family know.  Nurse noted that after receiving Ativan, anxiety and nausea has gone down markedly.  No more dry heaving.  Defer to Dr. Hassell Done will return care tomorrow  Adin Hector, M.D., F.A.C.S. Gastrointestinal and Minimally Invasive Surgery [facility] [address] [place], [place] [phone] Main / Paging

## 2017-06-30 NOTE — Progress Notes (Signed)
Notified by primary nurse Marjory Lies of difficulty inserting NGT.  He advised me that he had tried a few times on both nares and unable to get it passed the nasal cavity.  I attempted twice (once on both sides) with the same results, will not advance more than a three or four inches past entrance of nares, immediately encounter resistance.  I attempted to pause for a few minutes then attempt to re-advance with no success.  Pt at this time is refusing anymore attempts to insert NGT.  Pt requesting NGT be placed under sedation by IR, if possible.  [facility] ICU/SD Care Coordinator / Rapid Response Nurse

## 2017-06-30 NOTE — Progress Notes (Signed)
AM labs drawn from PICC w/ TPN infusing. Labs revealed elevated phosphorus. Concerned about sample being contaminated. Spoke with Leodis Sias, Pharmacist who stated that she did not feel phosphorus sample was elevated due to contamination as glucose and potassium were not elevated.

## 2017-07-01 LAB — BASIC METABOLIC PANEL
Anion gap: 12 (ref 5–15)
BUN: 44 mg/dL — AB (ref 6–20)
CHLORIDE: 95 mmol/L — AB (ref 101–111)
CO2: 23 mmol/L (ref 22–32)
Calcium: 10.3 mg/dL (ref 8.9–10.3)
Creatinine, Ser: 0.98 mg/dL (ref 0.61–1.24)
GFR calc Af Amer: >60 mL/min (ref >60)
GLUCOSE: 141 mg/dL — AB (ref 65–99)
POTASSIUM: 4.6 mmol/L (ref 3.5–5.1)
Sodium: 130 mmol/L — ABNORMAL LOW (ref 135–145)

## 2017-07-01 LAB — MAGNESIUM: Magnesium: 1.8 mg/dL (ref 1.7–2.4)

## 2017-07-01 LAB — PHOSPHORUS: Phosphorus: 5.5 mg/dL — ABNORMAL HIGH (ref 2.5–4.6)

## 2017-07-01 MED ORDER — TRAZODONE HCL 50 MG PO TABS
50.0000 mg | ORAL_TABLET | Freq: Every day | ORAL | Status: DC
  Administered 2017-07-03 – 2017-07-30 (×26): 50 mg via ORAL
  Filled 2017-07-01 (×31): qty 1

## 2017-07-01 MED ORDER — FAT EMULSION 20 % IV EMUL
240.0000 mL | INTRAVENOUS | Status: AC
  Administered 2017-07-01: 240 mL via INTRAVENOUS
  Filled 2017-07-01: qty 250

## 2017-07-01 MED ORDER — TRACE MINERALS CR-CU-MN-SE-ZN 10-1000-500-60 MCG/ML IV SOLN
INTRAVENOUS | Status: AC
  Administered 2017-07-01: 17:00:00 via INTRAVENOUS
  Filled 2017-07-01: qty 1000
  Filled 2017-07-01: qty 2760

## 2017-07-01 MED ORDER — FLUCONAZOLE IN SODIUM CHLORIDE 100-0.9 MG/50ML-% IV SOLN
100.0000 mg | INTRAVENOUS | Status: AC
  Administered 2017-07-01 – 2017-07-07 (×7): 100 mg via INTRAVENOUS
  Filled 2017-07-01 (×10): qty 50

## 2017-07-01 MED ORDER — LIDOCAINE 5 % EX PTCH
2.0000 | MEDICATED_PATCH | CUTANEOUS | Status: DC
  Administered 2017-07-01 – 2017-07-08 (×6): 2 via TRANSDERMAL
  Administered 2017-07-09 – 2017-07-12 (×3): 1 via TRANSDERMAL
  Administered 2017-07-13 – 2017-07-15 (×2): 2 via TRANSDERMAL
  Administered 2017-07-16 – 2017-07-20 (×3): 1 via TRANSDERMAL
  Administered 2017-07-21: 2 via TRANSDERMAL
  Administered 2017-07-22: 1 via TRANSDERMAL
  Administered 2017-07-23 – 2017-08-01 (×4): 2 via TRANSDERMAL
  Administered 2017-08-02: 1 via TRANSDERMAL
  Administered 2017-08-06: 2 via TRANSDERMAL
  Administered 2017-08-09: 1 via TRANSDERMAL
  Administered 2017-08-11 – 2017-09-02 (×2): 2 via TRANSDERMAL
  Filled 2017-07-01 (×65): qty 2

## 2017-07-01 MED ORDER — METOCLOPRAMIDE HCL 5 MG/ML IJ SOLN
10.0000 mg | Freq: Three times a day (TID) | INTRAMUSCULAR | Status: DC
  Administered 2017-07-01 – 2017-08-16 (×134): 10 mg via INTRAVENOUS
  Filled 2017-07-01 (×136): qty 2

## 2017-07-01 MED ORDER — TRACE MINERALS CR-CU-MN-SE-ZN 10-1000-500-60 MCG/ML IV SOLN
INTRAVENOUS | Status: DC
  Filled 2017-07-01: qty 2760

## 2017-07-01 MED ORDER — FAT EMULSION 20 % IV EMUL
240.0000 mL | INTRAVENOUS | Status: DC
  Filled 2017-07-01: qty 250

## 2017-07-01 MED ORDER — MAGNESIUM SULFATE 2 GM/50ML IV SOLN
2.0000 g | Freq: Once | INTRAVENOUS | Status: AC
  Administered 2017-07-01: 2 g via INTRAVENOUS
  Filled 2017-07-01: qty 50

## 2017-07-01 MED ORDER — FENTANYL CITRATE (PF) 100 MCG/2ML IJ SOLN
75.0000 ug | INTRAMUSCULAR | Status: DC | PRN
  Administered 2017-07-01 – 2017-07-15 (×71): 75 ug via INTRAVENOUS
  Filled 2017-07-01 (×72): qty 2

## 2017-07-01 NOTE — Progress Notes (Signed)
OT Cancellation Note  Patient Details Name: [name] [name] MRN: [id number] DOB: 1961/10/21   Cancelled Treatment:    Reason Eval/Treat Not Completed: Other (comment)  Noted increased HR with PT this afternoon. Will check on pt next day for OT. Thanks,   Mickel Baas [place], [place] [phone] 07/01/2017, 4:18 PM

## 2017-07-01 NOTE — Progress Notes (Signed)
Patient ID: [name] [name], male   DOB: 1961-08-15, 56 y.o.   MRN: [id number] [facility] Progress Note:   42 Days Post-Op  Subjective: Mental status is clear and he appears less depressed Objective: Vital signs in last 24 hours: Temp:  [97.3 F (36.3 C)-98.5 F (36.9 C)] 97.3 F (36.3 C) (09/05 0800) Pulse Rate:  [25-123] 115 (09/05 1000) Resp:  [9-29] 9 (09/05 1000) BP: (95-122)/(68-90) 110/90 (09/05 1000) SpO2:  [85 %-100 %] 96 % (09/05 1000) FiO2 (%):  [99 %] 99 % (09/04 1957) Weight:  [79.5 kg (175 lb 4.3 oz)] 79.5 kg (175 lb 4.3 oz) (09/05 0402)  Intake/Output from previous day: 09/04 0701 - 09/05 0700 In: 4284.1 [I.V.:4230.1; IV Piggyback:54] Out: 70 [Urine:800; Drains:1740] Intake/Output this shift: Total I/O In: 395 [I.V.:345; IV Piggyback:50] Out: -   Physical Exam: Work of breathing is normal.  Abdominal exam is unchanged.  Nausea better with meds  Lab Results:  Results for orders placed or performed during the hospital encounter of 05/06/17 (from the past 48 hour(s))  Magnesium     Status: None   Collection Time: 06/30/17  5:00 AM  Result Value Ref Range   Magnesium 2.0 1.7 - 2.4 mg/dL  Phosphorus     Status: Abnormal   Collection Time: 06/30/17  5:00 AM  Result Value Ref Range   Phosphorus 6.5 (H) 2.5 - 4.6 mg/dL  Basic metabolic panel     Status: Abnormal   Collection Time: 06/30/17  5:00 AM  Result Value Ref Range   Sodium 131 (L) 135 - 145 mmol/L   Potassium 4.1 3.5 - 5.1 mmol/L    Comment: DELTA CHECK NOTED REPEATED TO VERIFY NO VISIBLE HEMOLYSIS    Chloride 95 (L) 101 - 111 mmol/L   CO2 25 22 - 32 mmol/L   Glucose, Bld 127 (H) 65 - 99 mg/dL   BUN 34 (H) 6 - 20 mg/dL   Creatinine, Ser 0.83 0.61 - 1.24 mg/dL   Calcium 9.9 8.9 - 10.3 mg/dL   GFR calc non Af Amer >60 >60 mL/min   GFR calc Af Amer >60 >60 mL/min    Comment: (NOTE) The eGFR has been calculated using the CKD EPI equation. This calculation has not been validated in all  clinical situations. eGFR's persistently <60 mL/min signify possible Chronic Kidney Disease.    Anion gap 11 5 - 15  Magnesium     Status: None   Collection Time: 07/01/17  3:21 AM  Result Value Ref Range   Magnesium 1.8 1.7 - 2.4 mg/dL  Phosphorus     Status: Abnormal   Collection Time: 07/01/17  3:21 AM  Result Value Ref Range   Phosphorus 5.5 (H) 2.5 - 4.6 mg/dL  Basic metabolic panel     Status: Abnormal   Collection Time: 07/01/17  3:21 AM  Result Value Ref Range   Sodium 130 (L) 135 - 145 mmol/L   Potassium 4.6 3.5 - 5.1 mmol/L   Chloride 95 (L) 101 - 111 mmol/L   CO2 23 22 - 32 mmol/L   Glucose, Bld 141 (H) 65 - 99 mg/dL   BUN 44 (H) 6 - 20 mg/dL   Creatinine, Ser 0.98 0.61 - 1.24 mg/dL   Calcium 10.3 8.9 - 10.3 mg/dL   GFR calc non Af Amer >60 >60 mL/min   GFR calc Af Amer >60 >60 mL/min    Comment: (NOTE) The eGFR has been calculated using the CKD EPI equation. This calculation has  not been validated in all clinical situations. eGFR's persistently <60 mL/min signify possible Chronic Kidney Disease.    Anion gap 12 5 - 15    Radiology/Results: Ct Abdomen Pelvis W Contrast  Result Date: 06/29/2017 CLINICAL DATA:  56 year old male with recent right adrenalectomy for pheochromocytoma, small bowel resection and enterocutaneous fistula, currently with distal ileum in discontinuity and catheter drainage of the 2 bowel ends. Low volume enteric leak from separate anastomosis communicating to the anterior abdomen and the VAC dressing. EXAM: CT ABDOMEN AND PELVIS WITH CONTRAST TECHNIQUE: Multidetector CT imaging of the abdomen and pelvis was performed using the standard protocol following bolus administration of intravenous contrast. CONTRAST:  100 cc intravenous Isovue-300 COMPARISON:  06/09/2017 and prior CTs FINDINGS: Lower chest: Mild bibasilar atelectasis again noted. Hepatobiliary: The liver and gallbladder are unremarkable. No biliary dilatation. Pancreas: Unremarkable  Spleen: Unremarkable Adrenals/Urinary Tract: Right adrenalectomy changes noted. The left adrenal gland is unremarkable. The kidneys and bladder are unremarkable. Stomach/Bowel: Right colectomy, small bowel resection and postoperative changes within the abdomen are again identified. Two percutaneous enteric catheters are noted within small bowel. Minimally distended fluid-filled loops of small bowel within the left abdomen are noted without a discrete transition point and probably represents a mild focal ileus. No other dilated bowel loops are present. Anterior open right abdominal wound is noted with a percutaneous abdominal drain within the anterior abdomen. A thick walled linear collection along the anterior liver has slightly decreased in size. There is no evidence of pneumoperitoneum. Vascular/Lymphatic: Aortic atherosclerosis. No enlarged abdominal or pelvic lymph nodes. Reproductive: Prostate enlargement again noted Other: No free fluid or new focal collection Musculoskeletal: No acute abnormalities IMPRESSION: 1. Bowel postoperative changes again identified with mildly distended fluid filled small bowel loops in the left abdomen -suspect mild focal ileus. 2. Slightly smaller thick walled linear collection anterior to the liver. 3. No evidence of pneumoperitoneum or new fluid collection. 4.  Aortic Atherosclerosis (ICD10-I70.0). Electronically Signed   By: Margarette Canada M.D.   On: 06/29/2017 20:52    Anti-infectives: Anti-infectives    Start     Dose/Rate Route Frequency Ordered Stop   06/16/17 1200  piperacillin-tazobactam (ZOSYN) IVPB 3.375 g     3.375 g 12.5 mL/hr over 240 Minutes Intravenous Every 8 hours 06/16/17 1026 06/17/17 0004   06/08/17 1200  piperacillin-tazobactam (ZOSYN) IVPB 3.375 g  Status:  Discontinued     3.375 g 12.5 mL/hr over 240 Minutes Intravenous Every 8 hours 06/08/17 1056 06/16/17 1026   05/29/17 2200  ceFEPIme (MAXIPIME) 2 g in dextrose 5 % 50 mL IVPB  Status:   Discontinued     2 g 100 mL/hr over 30 Minutes Intravenous Every 8 hours 05/29/17 2031 06/03/17 0832   05/29/17 2100  metroNIDAZOLE (FLAGYL) IVPB 500 mg  Status:  Discontinued     500 mg 100 mL/hr over 60 Minutes Intravenous Every 8 hours 05/29/17 2029 06/03/17 0832   05/26/17 2200  ceFAZolin (ANCEF) IVPB 1 g/50 mL premix  Status:  Discontinued     1 g 100 mL/hr over 30 Minutes Intravenous Every 8 hours 05/26/17 1008 05/29/17 2023   05/25/17 1100  vancomycin (VANCOCIN) 1,250 mg in sodium chloride 0.9 % 250 mL IVPB  Status:  Discontinued     1,250 mg 166.7 mL/hr over 90 Minutes Intravenous Every 12 hours 05/25/17 0955 05/26/17 0946   05/22/17 1400  anidulafungin (ERAXIS) 100 mg in sodium chloride 0.9 % 100 mL IVPB     100 mg 78 mL/hr  over 100 Minutes Intravenous Every 24 hours 05/21/17 1330 05/27/17 1740   05/21/17 1400  anidulafungin (ERAXIS) 200 mg in sodium chloride 0.9 % 200 mL IVPB     200 mg 78 mL/hr over 200 Minutes Intravenous  Once 05/21/17 1330 05/21/17 1940   05/15/17 1000  anidulafungin (ERAXIS) 100 mg in sodium chloride 0.9 % 100 mL IVPB  Status:  Discontinued     100 mg 78 mL/hr over 100 Minutes Intravenous Every 24 hours 05/14/17 0829 05/16/17 0901   05/15/17 1000  metroNIDAZOLE (FLAGYL) IVPB 500 mg  Status:  Discontinued     500 mg 100 mL/hr over 60 Minutes Intravenous Every 8 hours 05/15/17 0903 05/25/17 0938   05/14/17 0900  anidulafungin (ERAXIS) 200 mg in sodium chloride 0.9 % 200 mL IVPB     200 mg 78 mL/hr over 200 Minutes Intravenous  Once 05/14/17 0829 05/14/17 1325   05/13/17 1927  vancomycin (VANCOCIN) 1-5 GM/200ML-% IVPB    Comments:  Ward, Christa   : cabinet override      05/13/17 1927 05/14/17 0744   05/12/17 1400  ceFEPIme (MAXIPIME) 1 g in dextrose 5 % 50 mL IVPB     1 g 100 mL/hr over 30 Minutes Intravenous Every 8 hours 05/12/17 0939 05/26/17 2359   05/12/17 0900  vancomycin (VANCOCIN) IVPB 1000 mg/200 mL premix  Status:  Discontinued     1,000  mg 200 mL/hr over 60 Minutes Intravenous Every 12 hours 05/12/17 0750 05/16/17 0852   05/12/17 0830  aztreonam (AZACTAM) 2 GM IVPB     2 g 100 mL/hr over 30 Minutes Intravenous  Once 05/12/17 0800 05/12/17 0957   05/06/17 0916  vancomycin (VANCOCIN) IVPB 1000 mg/200 mL premix     1,000 mg 200 mL/hr over 60 Minutes Intravenous On call to O.R. 05/06/17 0916 05/06/17 1229      Assessment/Plan: Problem List: Patient Active Problem List   Diagnosis Date Noted  . Hypomagnesemia 06/28/2017  . Hypothyroidism   . 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 Protein-calorie malnutrition, severe ([place]) 06/27/2017  . 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 Pressure injury of skin 05/29/2017  . Ileus ([place])   . Hypokalemia 05/14/2017  . Anastomotic leak of intestine 05/14/2017  . Wound dehiscence, surgical 05/14/2017  . Aspiration pneumonia ([place]) 05/14/2017  . Chronic narcotic use 05/10/2017  . Generalized anxiety disorder 05/10/2017  . Intra-abdominal adhesions s/p SB resection 05/06/2017 05/09/2017  . Throat symptom 03/18/2016  . Multinodular goiter 01/19/2016  . Hyperthyroidism 01/19/2016  . Essential hypertension   . Diarrhea 11/30/2014  . Anemia, iron deficiency 11/01/2014  . Leukocytosis 11/03/2013  . Tobacco abuse 07/26/2013  . COPD (chronic obstructive pulmonary disease) ([place]) 07/26/2013  . Left knee pain 07/26/2013  . Pain in joint, shoulder region 05/03/2013  . GERD 07/24/2008  . 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    Working toward surgery date later this month.   42 Days Post-Op    LOS: 56 days   Matt B. Hassell Done, MD, [facility] [phone] beeper [phone]  07/01/2017 10:56 AM

## 2017-07-01 NOTE — Progress Notes (Signed)
Much improved-talkative, nausea controlled- he says he feels like he wants to try something other than ice chips-feels appetite may be coming back. Less depressed. Pain still not optimal but I was conservative with fentanyl dosing yesterday-he isn't having any sedation from it- feels the diazepam was very effective.Ok to use PRN ativan for more acute-panic like reactions.  Plan: Increase PRN fentanyl to 73mcg Will increase patch tomorrow Add lidoderm patches Treat his oral thrush with fluconazole- most effective tx.-high risk for esophageal candidiasis Continue all other medications as ordered yesterday Will add on low dose trazdone for sleep.   Lane Hacker, DO Palliative Medicine [phone]

## 2017-07-01 NOTE — Progress Notes (Signed)
Patient ID: [name] [name], male   DOB: March 30, 1961, 56 y.o.   MRN: [id number]  [facility] Surgery Progress Note  42 Days Post-Op  Subjective: CC- nausea Patient states that nausea has improved. He has had no recent emesis. Abdominal pain well controlled.  Objective: Vital signs in last 24 hours: Temp:  [97.3 F (36.3 C)-98.5 F (36.9 C)] 97.3 F (36.3 C) (09/05 0800) Pulse Rate:  [25-123] 115 (09/05 1000) Resp:  [9-29] 9 (09/05 1000) BP: (95-122)/(68-90) 110/90 (09/05 1000) SpO2:  [85 %-100 %] 96 % (09/05 1000) FiO2 (%):  [99 %] 99 % (09/04 1957) Weight:  [175 lb 4.3 oz (79.5 kg)] 175 lb 4.3 oz (79.5 kg) (09/05 0402) Last BM Date: 06/27/17  Intake/Output from previous day: 09/04 0701 - 09/05 0700 In: 4284.1 [I.V.:4230.1; IV Piggyback:54] Out: 2540 [Urine:800; Drains:1740] Intake/Output this shift: Total I/O In: 395 [I.V.:345; IV Piggyback:50] Out: -   PE: Gen:  Alert, NAD, depressed affect HEENT: EOMs intact, pupils equal and round Pulm:  Normal effort, CTAB Cardio: RRR Abd: soft, NT, ND, hypoactive BS, wound vac in place, two foleys in place and draining  Lab Results:   Recent Labs  06/29/17 0434  WBC 23.6*  HGB 11.3*  HCT 33.6*  PLT 738*   BMET  Recent Labs  06/30/17 0500 07/01/17 0321  NA 131* 130*  K 4.1 4.6  CL 95* 95*  CO2 25 23  GLUCOSE 127* 141*  BUN 34* 44*  CREATININE 0.83 0.98  CALCIUM 9.9 10.3   PT/INR No results for input(s): LABPROT, INR in the last 72 hours. CMP     Component Value Date/Time   NA 130 (L) 07/01/2017 0321   K 4.6 07/01/2017 0321   CL 95 (L) 07/01/2017 0321   CO2 23 07/01/2017 0321   GLUCOSE 141 (H) 07/01/2017 0321   BUN 44 (H) 07/01/2017 0321   CREATININE 0.98 07/01/2017 0321   CREATININE 0.85 07/22/2013 1021   CALCIUM 10.3 07/01/2017 0321   PROT 7.9 06/29/2017 0434   ALBUMIN 3.1 (L) 06/29/2017 0434   AST 25 06/29/2017 0434   ALT 27 06/29/2017 0434   ALKPHOS 196 (H) 06/29/2017 0434   BILITOT 1.1  06/29/2017 0434   GFRNONAA >60 07/01/2017 0321   GFRAA >60 07/01/2017 0321   Lipase     Component Value Date/Time   LIPASE 27 09/15/2015 0934       Studies/Results: Ct Abdomen Pelvis W Contrast  Result Date: 06/29/2017 CLINICAL DATA:  56 year old male with recent right adrenalectomy for pheochromocytoma, small bowel resection and enterocutaneous fistula, currently with distal ileum in discontinuity and catheter drainage of the 2 bowel ends. Low volume enteric leak from separate anastomosis communicating to the anterior abdomen and the VAC dressing. EXAM: CT ABDOMEN AND PELVIS WITH CONTRAST TECHNIQUE: Multidetector CT imaging of the abdomen and pelvis was performed using the standard protocol following bolus administration of intravenous contrast. CONTRAST:  100 cc intravenous Isovue-300 COMPARISON:  06/09/2017 and prior CTs FINDINGS: Lower chest: Mild bibasilar atelectasis again noted. Hepatobiliary: The liver and gallbladder are unremarkable. No biliary dilatation. Pancreas: Unremarkable Spleen: Unremarkable Adrenals/Urinary Tract: Right adrenalectomy changes noted. The left adrenal gland is unremarkable. The kidneys and bladder are unremarkable. Stomach/Bowel: Right colectomy, small bowel resection and postoperative changes within the abdomen are again identified. Two percutaneous enteric catheters are noted within small bowel. Minimally distended fluid-filled loops of small bowel within the left abdomen are noted without a discrete transition point and probably represents a mild focal ileus. No  other dilated bowel loops are present. Anterior open right abdominal wound is noted with a percutaneous abdominal drain within the anterior abdomen. A thick walled linear collection along the anterior liver has slightly decreased in size. There is no evidence of pneumoperitoneum. Vascular/Lymphatic: Aortic atherosclerosis. No enlarged abdominal or pelvic lymph nodes. Reproductive: Prostate enlargement again  noted Other: No free fluid or new focal collection Musculoskeletal: No acute abnormalities IMPRESSION: 1. Bowel postoperative changes again identified with mildly distended fluid filled small bowel loops in the left abdomen -suspect mild focal ileus. 2. Slightly smaller thick walled linear collection anterior to the liver. 3. No evidence of pneumoperitoneum or new fluid collection. 4.  Aortic Atherosclerosis (ICD10-I70.0). Electronically Signed   By: Margarette Canada M.D.   On: 06/29/2017 20:52    Anti-infectives: Anti-infectives    Start     Dose/Rate Route Frequency Ordered Stop   06/16/17 1200  piperacillin-tazobactam (ZOSYN) IVPB 3.375 g     3.375 g 12.5 mL/hr over 240 Minutes Intravenous Every 8 hours 06/16/17 1026 06/17/17 0004   06/08/17 1200  piperacillin-tazobactam (ZOSYN) IVPB 3.375 g  Status:  Discontinued     3.375 g 12.5 mL/hr over 240 Minutes Intravenous Every 8 hours 06/08/17 1056 06/16/17 1026   05/29/17 2200  ceFEPIme (MAXIPIME) 2 g in dextrose 5 % 50 mL IVPB  Status:  Discontinued     2 g 100 mL/hr over 30 Minutes Intravenous Every 8 hours 05/29/17 2031 06/03/17 0832   05/29/17 2100  metroNIDAZOLE (FLAGYL) IVPB 500 mg  Status:  Discontinued     500 mg 100 mL/hr over 60 Minutes Intravenous Every 8 hours 05/29/17 2029 06/03/17 0832   05/26/17 2200  ceFAZolin (ANCEF) IVPB 1 g/50 mL premix  Status:  Discontinued     1 g 100 mL/hr over 30 Minutes Intravenous Every 8 hours 05/26/17 1008 05/29/17 2023   05/25/17 1100  vancomycin (VANCOCIN) 1,250 mg in sodium chloride 0.9 % 250 mL IVPB  Status:  Discontinued     1,250 mg 166.7 mL/hr over 90 Minutes Intravenous Every 12 hours 05/25/17 0955 05/26/17 0946   05/22/17 1400  anidulafungin (ERAXIS) 100 mg in sodium chloride 0.9 % 100 mL IVPB     100 mg 78 mL/hr over 100 Minutes Intravenous Every 24 hours 05/21/17 1330 05/27/17 1740   05/21/17 1400  anidulafungin (ERAXIS) 200 mg in sodium chloride 0.9 % 200 mL IVPB     200 mg 78 mL/hr over  200 Minutes Intravenous  Once 05/21/17 1330 05/21/17 1940   05/15/17 1000  anidulafungin (ERAXIS) 100 mg in sodium chloride 0.9 % 100 mL IVPB  Status:  Discontinued     100 mg 78 mL/hr over 100 Minutes Intravenous Every 24 hours 05/14/17 0829 05/16/17 0901   05/15/17 1000  metroNIDAZOLE (FLAGYL) IVPB 500 mg  Status:  Discontinued     500 mg 100 mL/hr over 60 Minutes Intravenous Every 8 hours 05/15/17 0903 05/25/17 0938   05/14/17 0900  anidulafungin (ERAXIS) 200 mg in sodium chloride 0.9 % 200 mL IVPB     200 mg 78 mL/hr over 200 Minutes Intravenous  Once 05/14/17 0829 05/14/17 1325   05/13/17 1927  vancomycin (VANCOCIN) 1-5 GM/200ML-% IVPB    Comments:  Ward, Christa   : cabinet override      05/13/17 1927 05/14/17 0744   05/12/17 1400  ceFEPIme (MAXIPIME) 1 g in dextrose 5 % 50 mL IVPB     1 g 100 mL/hr over 30 Minutes Intravenous Every 8  hours 05/12/17 0939 05/26/17 2359   05/12/17 0900  vancomycin (VANCOCIN) IVPB 1000 mg/200 mL premix  Status:  Discontinued     1,000 mg 200 mL/hr over 60 Minutes Intravenous Every 12 hours 05/12/17 0750 05/16/17 0852   05/12/17 0830  aztreonam (AZACTAM) 2 GM IVPB     2 g 100 mL/hr over 30 Minutes Intravenous  Once 05/12/17 0800 05/12/17 0957   05/06/17 0916  vancomycin (VANCOCIN) IVPB 1000 mg/200 mL premix     1,000 mg 200 mL/hr over 60 Minutes Intravenous On call to O.R. 05/06/17 0916 05/06/17 1229       Assessment/Plan HTN COPD Tobacco abuse Malnutrition - improving, prealbumin 24.7 (9/3) Anemia - Hg 11.3 (9/3)  OPEN RIGHT ADRENALECTOMY WITH SMALL BOWEL RESECTION AND ANASTOMOSIS - 05/06/2017 - [name] - Additional surgery - 05/13/2017, 05/14/2017, 05/17/2017 and 05/20/2017 - Currently with distal ileum in discontinuity and catheter drainage of the 2 bowel ends, and an EC fistula - CT 9/3 ok - Vac changes MWF - Dr. Hassell Done considering taking patient back to surgery later this month (2 months after prior surgery)  FEN: IVF,  TPN VTE: SCD's, lovenox ID: currently  Foley: none Follow up: TBD  DISPO: Nausea improved. Continue NPO and TPN. I will return later today during vac change to evaluate wound.   LOS: 54 days    Wellington Hampshire , [facility] Surgery 07/01/2017, 10:34 AM Pager: [phone] Consults: [phone] Mon-Fri 7:00 am-4:30 pm Sat-Sun 7:00 am-11:30 am   Addendum:

## 2017-07-01 NOTE — Consult Note (Signed)
[facility] Nurse wound consult note Reason for Consult:Routine NPWT dressing change.  I am assisted today by CCS PA B.Meuth who is in to photograph the wound and bedside RN Bri.   Wound type: Surgical Pressure Injury POA: N/A Measurement: per Monday Wound bed: 80-% red. Moist with healthy granulation tissue in center.  20% with exposed mesh, brown feculent material Drainage (amount, consistency, odor)  Brown in tubing and in cannister. Periwound: More periwound maceration noted today at superior and inferior aspects of wound; drape strips applied to these regions in an attempt to repair and prevent damp sponges from making contact with skin.  Dressing procedure/placement/frequency: Medical Adhesive remover spray used to remove drape and tape from tubes. Tube care performed and dressings replaced. One piece of white foam and one piece of black foam removed today; wound cleansed with NS prior to photo. One pice of white foam and one piece of black foam used to fill defect, drape applied and T.R.A.C. Pad applied.  Negative pressure initiated at 114mmHg continuous suction and an immediate seal achieved. Patient was medicated with Fentanyl prior to dressing change and tolerated procedure well. Supplies for Friday's dressing change are requested for delivery to patient's room today. I saw wife in the hall as I was charting this note and I relayed the dressing change nuances (PA was present, cannister changed, no change in presentation) to her. I will be out of town on Friday and Monday, September 7 and 10, and one of my partners will be changing the dressing in my absence. I will see the patient again on Wednesday, September 12.  [place] nursing team will follow, and will remain available to this patient, the nursing, surgical and medical teams.   Thanks, Maudie Flakes, MSN, RN, [place], Arther Abbott  Pager# [phone]

## 2017-07-01 NOTE — Progress Notes (Signed)
[facility] [place] NOTE   Pharmacy Consult for TPN Indication: prolonged ileus, small bowel leak, multiple bowel surgeries  Patient Measurements: Body mass index is 25.15 kg/m. Filed Weights   06/29/17 0452 06/30/17 0342 07/01/17 0402  Weight: 181 lb 3.5 oz (82.2 kg) 185 lb 10 oz (84.2 kg) 175 lb 4.3 oz (79.5 kg)   HPI: 33 yoM admitted on 7/11 for enlarging adrenal mass and planned adrenalectomy.  Pharmacy consulted to dose TPN.  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   7/22 OR:  wash out, tube ileostomy, left with open abdomen & wound vac in place.  Plan for OR on 7/25 for closure 7/25 OR: mesh placed for abdominal closure, wound vac 8/2 extubated, propofol drip off 8/4 TPN off for ~ 5 hours due to line detached from filter 8/10 MD request decrease rate x 1 week 2nd low sodium 8/12 Additional sodium added to TPN due to persistent hyponatremia 8/16 Increasing rate back to goal since sodium has improved with above intervention.   8/20 Prealbumin decreased.  New apparent enteric leak per surgery notes. 8/21 CaPhos product elevated, remove electrolytes from TPN 8/25 PICC did not have blood return in 2 of the 3 ports.  Alteplase intra-catheter and function returned.  TPN infusing correctly on 8/26 per RN. 8/28: d/c SSI 8/30: added electrolytes to TPN 8/31: TPN IV line came out around ~0340 and current bag was stopped at this time.  Restart new clinimix 5/15 1L bag (no lytes since phos is elevated this morning) until next bag is due at [facility]. Will not add MVI and trace elements to this 1L bag.  Use electrolyte free clinimix with new bag at [facility] 9/1 NGT out 9/3 added electrolytes to TPN 9/4 lytes out of TPN  Insulin requirements past 24 hours: not on insulin (no hx DM)  Current Nutrition: Clear Liquid Diet (8/21) - boost bid 8/22, decreased to 1/2 container 9/2, dc'd 9/3 - pro-stat 30 mL daily started on 8/27- pt refusing x 3 days  IVF: none. Lasix 20 IV qday  Central access: PICC line ordered 7/19, placed on 7/20 TPN start date: 7/20  ASSESSMENT                                                                                                           Today, 07/01/17  - Glucose (goal <150): at goal, but up to 141 this morning,  -  Electrolytes: K 4.6, Mag 1.8  after 2 gm bolus,  Na 130 with Na 100 meq/L in TPN, phos 5.5, , CorrCa 11 , Ca/phos product = 60.5, goal is < 55- No electrolytes in TPN - Renal:  SCr wnl,  BUN up to 44,   - LFTs:  WNL except alkaline phos elevated- 196 - TGs:   206 (9/3) - Prealbumin:  <5 (7/20), 6.1 (7/24), 10.1 (7/30) 24.8 (8/6), 26.4 (8/13), 15.9 (8/20), 19.7 (8/27) 24.7 (9/3)- within normal range -pt with persistent N, dry heaves, CT scan 9/3 PM - mild focal ileus - 9/4 pall care dc dilaudid PCA>> fent patch, dc thorazine, continued reglan 10 IV q6h, PPI IV BID and pepcid 40 in TPN Started scheduled zofran, zyprexa, prn ativan, scheduled IV diazepam, rec trial of octreotide - 9/4 RN unable to get NGT to insert, pt requesting NGT under sedation by IR  NUTRITIONAL GOALS                                                                                             RD recs (9/3):  122-138 gms Protein (1.5 - 1.7 g/kg) 2030 - 2270 Kcal  Recalculation: Clinimix 5/15 at rate of 115 ml/hr with lipids 3x/week on MWF only would provide: 138g protein and avg 2164 kcal/day  - Glucose infusion rate will be 3.53 mg/kg/min (Maximum 5 mg/kg/min)   PLAN      Now:  Magnesium 2 gm  IV x 1   At 1800 today: ? continue Clinimix 5/15 (No electrolytes) at 115 ml/hr due  to elevated calcium phos product  Add sodium to TPN  (add 125 mEq of Na/L in a 1:1 LP:FXTKWIO ratio) for hyponatremia ? Lipids at 57mls/hr x 12 hours 3 times weekly on MWF  MVI/TE in TPN and pepcid 40 mg/day in TPN  TPN lab panels on Mondays & Thursdays.  Check BMET, Mag & Phos daily  Eudelia Bunch, Pharm.D. [phone] 07/01/2017 7:34 AM

## 2017-07-01 NOTE — Progress Notes (Signed)
Physical Therapy Treatment Patient Details Name: JAYLENE SCHROM MRN: [id number] DOB: 12/25/1960 Today's Date: 07/01/2017    History of Present Illness  56 y.o. 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 On 7/18, he developed a leak therefore was taken back to OR for re-do of ex-lap and repair of leak on 05/20/17.  He returned to the ICU on the vent . Extubated on 05/28/17.     PT Comments    [place] looks goods.  Bright.  Assisted OOB to amb however a limited distance due to increased HR as high as 146.  See details below.    Follow Up Recommendations  SNF;Supervision/Assistance - 24 hour     Equipment Recommendations  Rolling walker with 5" wheels    Recommendations for Other Services       Precautions / Restrictions Precautions Precautions: Fall Precaution Comments: multiple lines in abdomen, VAC, , and JP drain Restrictions Weight Bearing Restrictions: No    Mobility  Bed Mobility Overal bed mobility: Needs Assistance Bed Mobility: Supine to Sit     Supine to sit: +2 for safety/equipment;Min guard     General bed mobility comments: + 2 to watch multiple lines/tubes    pt able to self rise with use of rail  Transfers Overall transfer level: Needs assistance Equipment used: Rolling walker (2 wheeled) Transfers: Sit to/from [facility] Sit to Stand: Min guard;+2 safety/equipment;Min assist         General transfer comment: Min a to help control descent to chair; assist for multiple lines  Ambulation/Gait Ambulation/Gait assistance: Min guard;+2 safety/equipment Ambulation Distance (Feet): 40 Feet (20 feet x 2 one sitting rest break) Assistive device: Rolling walker (2 wheeled) Gait Pattern/deviations: Step-to pattern;Step-through pattern     General Gait Details:  amb distance limited by HIGH HR and c/o fatigue.  HR at rest was 114.  HR increased to 144 after 20 feet.  Recliner brought to pt to cease activity.  Allowed an extended sitting rest break, amb a seclond time same situation.  HR increased to 146.  RR 34.  Reported to RN.   Stairs            Wheelchair Mobility    Modified Rankin (Stroke Patients Only)       Balance                                            Cognition Arousal/Alertness: Awake/alert Behavior During Therapy: WFL for tasks assessed/performed Overall Cognitive Status: Within Functional Limits for tasks assessed                                 General Comments: pleasant/cooperative      Exercises      General Comments        Pertinent Vitals/Pain Pain Assessment: Faces Faces Pain Scale: Hurts even more Pain Location: ABD Pain Descriptors / Indicators: Sore;Grimacing Pain Intervention(s): Monitored during session    Home Living                      Prior Function            PT Goals (  current goals can now be found in the care plan section) Progress towards PT goals: Progressing toward goals    Frequency    Min 3X/week      PT Plan Current plan remains appropriate    Co-evaluation              AM-PAC PT "6 Clicks" Daily Activity  Outcome Measure    Difficulty moving from lying on back to sitting on the side of the bed? : A Lot Difficulty sitting down on and standing up from a chair with arms (e.g., wheelchair, bedside commode, etc,.)?: A Lot Help needed moving to and from a bed to chair (including a wheelchair)?: A Little Help needed walking in hospital room?: Total Help needed climbing 3-5 steps with a railing? : Total 6 Click Score: 9    End of Session Equipment Utilized During Treatment: Gait belt Activity Tolerance: Other (comment) Patient left: in chair;with call bell/phone within reach;with family/visitor present Nurse  Communication: Mobility status PT Visit Diagnosis: Difficulty in walking, not elsewhere classified (R26.2);Muscle weakness (generalized) (M62.81)     Time: 4825-0037 PT Time Calculation (min) (ACUTE ONLY): 24 min  Charges:  $Gait Training: 8-22 mins $Therapeutic Activity: 8-22 mins                    G Codes:       [name]  PTA [facility]  Acute  Rehab Pager      [phone]

## 2017-07-02 LAB — COMPREHENSIVE METABOLIC PANEL
ALBUMIN: 3.2 g/dL — AB (ref 3.5–5.0)
ALK PHOS: 240 U/L — AB (ref 38–126)
ALT: 82 U/L — ABNORMAL HIGH (ref 17–63)
AST: 60 U/L — ABNORMAL HIGH (ref 15–41)
Anion gap: 12 (ref 5–15)
BILIRUBIN TOTAL: 1.3 mg/dL — AB (ref 0.3–1.2)
BUN: 48 mg/dL — ABNORMAL HIGH (ref 6–20)
CALCIUM: 10.1 mg/dL (ref 8.9–10.3)
CO2: 26 mmol/L (ref 22–32)
Chloride: 98 mmol/L — ABNORMAL LOW (ref 101–111)
Creatinine, Ser: 0.97 mg/dL (ref 0.61–1.24)
GFR calc Af Amer: >60 mL/min (ref >60)
GFR calc non Af Amer: >60 mL/min (ref >60)
GLUCOSE: 144 mg/dL — AB (ref 65–99)
Potassium: 3.5 mmol/L (ref 3.5–5.1)
Sodium: 136 mmol/L (ref 135–145)
TOTAL PROTEIN: 8.2 g/dL — AB (ref 6.5–8.1)

## 2017-07-02 LAB — MAGNESIUM: Magnesium: 1.7 mg/dL (ref 1.7–2.4)

## 2017-07-02 LAB — PHOSPHORUS: Phosphorus: 4.8 mg/dL — ABNORMAL HIGH (ref 2.5–4.6)

## 2017-07-02 MED ORDER — FENTANYL 100 MCG/HR TD PT72
100.0000 ug | MEDICATED_PATCH | TRANSDERMAL | Status: DC
  Administered 2017-07-02 – 2017-07-11 (×4): 100 ug via TRANSDERMAL
  Filled 2017-07-02 (×4): qty 1

## 2017-07-02 MED ORDER — TRACE MINERALS CR-CU-MN-SE-ZN 10-1000-500-60 MCG/ML IV SOLN
INTRAVENOUS | Status: AC
  Administered 2017-07-02: 17:00:00 via INTRAVENOUS
  Filled 2017-07-02: qty 1000
  Filled 2017-07-02: qty 2760

## 2017-07-02 MED ORDER — MAGNESIUM SULFATE IN D5W 1-5 GM/100ML-% IV SOLN
1.0000 g | Freq: Once | INTRAVENOUS | Status: AC
Start: 2017-07-02 — End: 2017-07-02
  Administered 2017-07-02: 1 g via INTRAVENOUS
  Filled 2017-07-02: qty 100

## 2017-07-02 MED ORDER — POTASSIUM CHLORIDE 10 MEQ/100ML IV SOLN
10.0000 meq | INTRAVENOUS | Status: AC
  Administered 2017-07-02 (×3): 10 meq via INTRAVENOUS
  Filled 2017-07-02 (×3): qty 100

## 2017-07-02 MED ORDER — IPRATROPIUM-ALBUTEROL 0.5-2.5 (3) MG/3ML IN SOLN: 3.0000 mL | Freq: Four times a day (QID) | RESPIRATORY_TRACT | Status: DC | PRN

## 2017-07-02 NOTE — Care Management Note (Signed)
Case Management Note  Patient Details  Name: [name] [name] MRN: [id number] Date of Birth: 03-28-1961  Subjective/Objective:    Ng tube out/cl.ligs./mental status clearer/iv tpn on going/wound vac/drains to surgical site x2.                Action/Plan: Date:  July 02, 2017 Chart reviewed for concurrent status and case management needs. Will continue to follow patient progress. Discharge Planning: following for needs Expected discharge date: [id number] [name], BSN, [place], [place]  Expected Discharge Date:                  Expected Discharge Plan:  Home/Self Care  In-House Referral:     Discharge planning Services  CM Consult  Post Acute Care Choice:    Choice offered to:     DME Arranged:    DME Agency:     HH Arranged:    HH Agency:     Status of Service:  In process, will continue to follow  If discussed at Long Length of Stay Meetings, dates discussed:    Additional Comments:  Leeroy Cha, RN 07/02/2017, 11:16 AM

## 2017-07-02 NOTE — Progress Notes (Signed)
Patient ID: [name] [name], male   DOB: 10/20/1961, 56 y.o.   MRN: [id number] [facility] Progress Note:   43 Days Post-Op  Subjective: Mental status is clear;  Feeling much better and seems less depressed Objective: Vital signs in last 24 hours: Temp:  [97.5 F (36.4 C)-98.6 F (37 C)] 98.6 F (37 C) (09/06 0800) Pulse Rate:  [65-119] 109 (09/06 0800) Resp:  [9-36] 24 (09/06 0800) BP: (95-139)/(73-94) 139/92 (09/06 0800) SpO2:  [95 %-100 %] 99 % (09/06 0800) Weight:  [78.3 kg (172 lb 9.9 oz)] 78.3 kg (172 lb 9.9 oz) (09/06 0500)  Intake/Output from previous day: 09/05 0701 - 09/06 0700 In: 1328.8 [I.V.:1228.8; IV Piggyback:100] Out: 1625 [Urine:200; Drains:1425] Intake/Output this shift: Total I/O In: -  Out: 20 [Drains:20]  Physical Exam: Work of breathing is normal.  Drains unchanged.    Lab Results:  Results for orders placed or performed during the hospital encounter of 05/06/17 (from the past 48 hour(s))  Magnesium     Status: None   Collection Time: 07/01/17  3:21 AM  Result Value Ref Range   Magnesium 1.8 1.7 - 2.4 mg/dL  Phosphorus     Status: Abnormal   Collection Time: 07/01/17  3:21 AM  Result Value Ref Range   Phosphorus 5.5 (H) 2.5 - 4.6 mg/dL  Basic metabolic panel     Status: Abnormal   Collection Time: 07/01/17  3:21 AM  Result Value Ref Range   Sodium 130 (L) 135 - 145 mmol/L   Potassium 4.6 3.5 - 5.1 mmol/L   Chloride 95 (L) 101 - 111 mmol/L   CO2 23 22 - 32 mmol/L   Glucose, Bld 141 (H) 65 - 99 mg/dL   BUN 44 (H) 6 - 20 mg/dL   Creatinine, Ser 0.98 0.61 - 1.24 mg/dL   Calcium 10.3 8.9 - 10.3 mg/dL   GFR calc non Af Amer >60 >60 mL/min   GFR calc Af Amer >60 >60 mL/min    Comment: (NOTE) The eGFR has been calculated using the CKD EPI equation. This calculation has not been validated in all clinical situations. eGFR's persistently <60 mL/min signify possible Chronic Kidney Disease.    Anion gap 12 5 - 15  Magnesium     Status: None    Collection Time: 07/02/17  6:17 AM  Result Value Ref Range   Magnesium 1.7 1.7 - 2.4 mg/dL  Phosphorus     Status: Abnormal   Collection Time: 07/02/17  6:17 AM  Result Value Ref Range   Phosphorus 4.8 (H) 2.5 - 4.6 mg/dL  Comprehensive metabolic panel     Status: Abnormal   Collection Time: 07/02/17  6:17 AM  Result Value Ref Range   Sodium 136 135 - 145 mmol/L   Potassium 3.5 3.5 - 5.1 mmol/L    Comment: DELTA CHECK NOTED REPEATED TO VERIFY    Chloride 98 (L) 101 - 111 mmol/L   CO2 26 22 - 32 mmol/L   Glucose, Bld 144 (H) 65 - 99 mg/dL   BUN 48 (H) 6 - 20 mg/dL   Creatinine, Ser 0.97 0.61 - 1.24 mg/dL   Calcium 10.1 8.9 - 10.3 mg/dL   Total Protein 8.2 (H) 6.5 - 8.1 g/dL   Albumin 3.2 (L) 3.5 - 5.0 g/dL   AST 60 (H) 15 - 41 U/L   ALT 82 (H) 17 - 63 U/L   Alkaline Phosphatase 240 (H) 38 - 126 U/L   Total Bilirubin 1.3 (H) 0.3 -  1.2 mg/dL   GFR calc non Af Amer >60 >60 mL/min   GFR calc Af Amer >60 >60 mL/min    Comment: (NOTE) The eGFR has been calculated using the CKD EPI equation. This calculation has not been validated in all clinical situations. eGFR's persistently <60 mL/min signify possible Chronic Kidney Disease.    Anion gap 12 5 - 15    Radiology/Results: No results found.  Anti-infectives: Anti-infectives    Start     Dose/Rate Route Frequency Ordered Stop   07/01/17 1500  fluconazole (DIFLUCAN) IVPB 100 mg     100 mg 50 mL/hr over 60 Minutes Intravenous Every 24 hours 07/01/17 1401 07/08/17 1459   06/16/17 1200  piperacillin-tazobactam (ZOSYN) IVPB 3.375 g     3.375 g 12.5 mL/hr over 240 Minutes Intravenous Every 8 hours 06/16/17 1026 06/17/17 0004   06/08/17 1200  piperacillin-tazobactam (ZOSYN) IVPB 3.375 g  Status:  Discontinued     3.375 g 12.5 mL/hr over 240 Minutes Intravenous Every 8 hours 06/08/17 1056 06/16/17 1026   05/29/17 2200  ceFEPIme (MAXIPIME) 2 g in dextrose 5 % 50 mL IVPB  Status:  Discontinued     2 g 100 mL/hr over 30 Minutes  Intravenous Every 8 hours 05/29/17 2031 06/03/17 0832   05/29/17 2100  metroNIDAZOLE (FLAGYL) IVPB 500 mg  Status:  Discontinued     500 mg 100 mL/hr over 60 Minutes Intravenous Every 8 hours 05/29/17 2029 06/03/17 0832   05/26/17 2200  ceFAZolin (ANCEF) IVPB 1 g/50 mL premix  Status:  Discontinued     1 g 100 mL/hr over 30 Minutes Intravenous Every 8 hours 05/26/17 1008 05/29/17 2023   05/25/17 1100  vancomycin (VANCOCIN) 1,250 mg in sodium chloride 0.9 % 250 mL IVPB  Status:  Discontinued     1,250 mg 166.7 mL/hr over 90 Minutes Intravenous Every 12 hours 05/25/17 0955 05/26/17 0946   05/22/17 1400  anidulafungin (ERAXIS) 100 mg in sodium chloride 0.9 % 100 mL IVPB     100 mg 78 mL/hr over 100 Minutes Intravenous Every 24 hours 05/21/17 1330 05/27/17 1740   05/21/17 1400  anidulafungin (ERAXIS) 200 mg in sodium chloride 0.9 % 200 mL IVPB     200 mg 78 mL/hr over 200 Minutes Intravenous  Once 05/21/17 1330 05/21/17 1940   05/15/17 1000  anidulafungin (ERAXIS) 100 mg in sodium chloride 0.9 % 100 mL IVPB  Status:  Discontinued     100 mg 78 mL/hr over 100 Minutes Intravenous Every 24 hours 05/14/17 0829 05/16/17 0901   05/15/17 1000  metroNIDAZOLE (FLAGYL) IVPB 500 mg  Status:  Discontinued     500 mg 100 mL/hr over 60 Minutes Intravenous Every 8 hours 05/15/17 0903 05/25/17 0938   05/14/17 0900  anidulafungin (ERAXIS) 200 mg in sodium chloride 0.9 % 200 mL IVPB     200 mg 78 mL/hr over 200 Minutes Intravenous  Once 05/14/17 0829 05/14/17 1325   05/13/17 1927  vancomycin (VANCOCIN) 1-5 GM/200ML-% IVPB    Comments:  Ward, Christa   : cabinet override      05/13/17 1927 05/14/17 0744   05/12/17 1400  ceFEPIme (MAXIPIME) 1 g in dextrose 5 % 50 mL IVPB     1 g 100 mL/hr over 30 Minutes Intravenous Every 8 hours 05/12/17 0939 05/26/17 2359   05/12/17 0900  vancomycin (VANCOCIN) IVPB 1000 mg/200 mL premix  Status:  Discontinued     1,000 mg 200 mL/hr over 60 Minutes Intravenous Every  12  hours 05/12/17 0750 05/16/17 0852   05/12/17 0830  aztreonam (AZACTAM) 2 GM IVPB     2 g 100 mL/hr over 30 Minutes Intravenous  Once 05/12/17 0800 05/12/17 0957   05/06/17 0916  vancomycin (VANCOCIN) IVPB 1000 mg/200 mL premix     1,000 mg 200 mL/hr over 60 Minutes Intravenous On call to O.R. 05/06/17 0916 05/06/17 1229      Assessment/Plan: Problem List: Patient Active Problem List   Diagnosis Date Noted  . Hypomagnesemia 06/28/2017  . Hypothyroidism   . 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 Protein-calorie malnutrition, severe ([place]) 06/27/2017  . 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 Pressure injury of skin 05/29/2017  . Ileus ([place])   . Hypokalemia 05/14/2017  . Anastomotic leak of intestine 05/14/2017  . Wound dehiscence, surgical 05/14/2017  . Aspiration pneumonia ([place]) 05/14/2017  . Chronic narcotic use 05/10/2017  . Generalized anxiety disorder 05/10/2017  . Intra-abdominal adhesions s/p SB resection 05/06/2017 05/09/2017  . Throat symptom 03/18/2016  . Multinodular goiter 01/19/2016  . Hyperthyroidism 01/19/2016  . Essential hypertension   . Diarrhea 11/30/2014  . Anemia, iron deficiency 11/01/2014  . Leukocytosis 11/03/2013  . Tobacco abuse 07/26/2013  . COPD (chronic obstructive pulmonary disease) ([place]) 07/26/2013  . Left knee pain 07/26/2013  . Pain in joint, shoulder region 05/03/2013  . GERD 07/24/2008  . 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    On clear liquids to sip PO;  NG out 43 Days Post-Op    LOS: 57 days   Matt B. Hassell Done, MD, [facility] [phone] beeper [phone]  07/02/2017 9:09 AM

## 2017-07-02 NOTE — Progress Notes (Signed)
Patient ID: [name] [name], male   DOB: 02-09-1961, 56 y.o.   MRN: [id number]  [facility] Progress Note  43 Days Post-Op  Subjective: CC- nausea No nausea yesterday, but patient states that he did have some mild nausea after sipping water this morning. No emesis. Nausea now relieved. Denies increased abdominal pain. Patient asking when his surgery is going to be.  Objective: Vital signs in last 24 hours: Temp:  [97.5 F (36.4 C)-98.6 F (37 C)] 98.6 F (37 C) (09/06 0800) Pulse Rate:  [65-119] 114 (09/06 0600) Resp:  [9-36] 25 (09/06 0600) BP: (95-126)/(73-94) 95/73 (09/06 0200) SpO2:  [95 %-100 %] 100 % (09/06 0600) Weight:  [172 lb 9.9 oz (78.3 kg)] 172 lb 9.9 oz (78.3 kg) (09/06 0500) Last BM Date: 06/27/17  Intake/Output from previous day: 09/05 0701 - 09/06 0700 In: 1328.8 [I.V.:1228.8; IV Piggyback:100] Out: 1625 [Urine:200; Drains:1425] Intake/Output this shift: No intake/output data recorded.  PE: Gen: Alert, NAD, depressed affect HEENT: EOMs intact, pupils equal and round Pulm: Normal effort, CTAB Cardio: RRR Abd: soft, mild global tenderness, ND, few BS heard, wound vac in place, two foleys in place and draining  Lab Results:  No results for input(s): WBC, HGB, HCT, PLT in the last 72 hours. BMET  Recent Labs  07/01/17 0321 07/02/17 0617  NA 130* 136  K 4.6 3.5  CL 95* 98*  CO2 23 26  GLUCOSE 141* 144*  BUN 44* 48*  CREATININE 0.98 0.97  CALCIUM 10.3 10.1   PT/INR No results for input(s): LABPROT, INR in the last 72 hours. CMP     Component Value Date/Time   NA 136 07/02/2017 0617   K 3.5 07/02/2017 0617   CL 98 (L) 07/02/2017 0617   CO2 26 07/02/2017 0617   GLUCOSE 144 (H) 07/02/2017 0617   BUN 48 (H) 07/02/2017 0617   CREATININE 0.97 07/02/2017 0617   CREATININE 0.85 07/22/2013 1021   CALCIUM 10.1 07/02/2017 0617   PROT 8.2 (H) 07/02/2017 0617   ALBUMIN 3.2 (L) 07/02/2017 0617   AST 60 (H) 07/02/2017 0617   ALT 82 (H)  07/02/2017 0617   ALKPHOS 240 (H) 07/02/2017 0617   BILITOT 1.3 (H) 07/02/2017 0617   GFRNONAA >60 07/02/2017 0617   GFRAA >60 07/02/2017 0617   Lipase     Component Value Date/Time   LIPASE 27 09/15/2015 0934       Studies/Results: No results found.  Anti-infectives: Anti-infectives    Start     Dose/Rate Route Frequency Ordered Stop   07/01/17 1500  fluconazole (DIFLUCAN) IVPB 100 mg     100 mg 50 mL/hr over 60 Minutes Intravenous Every 24 hours 07/01/17 1401 07/08/17 1459   06/16/17 1200  piperacillin-tazobactam (ZOSYN) IVPB 3.375 g     3.375 g 12.5 mL/hr over 240 Minutes Intravenous Every 8 hours 06/16/17 1026 06/17/17 0004   06/08/17 1200  piperacillin-tazobactam (ZOSYN) IVPB 3.375 g  Status:  Discontinued     3.375 g 12.5 mL/hr over 240 Minutes Intravenous Every 8 hours 06/08/17 1056 06/16/17 1026   05/29/17 2200  ceFEPIme (MAXIPIME) 2 g in dextrose 5 % 50 mL IVPB  Status:  Discontinued     2 g 100 mL/hr over 30 Minutes Intravenous Every 8 hours 05/29/17 2031 06/03/17 0832   05/29/17 2100  metroNIDAZOLE (FLAGYL) IVPB 500 mg  Status:  Discontinued     500 mg 100 mL/hr over 60 Minutes Intravenous Every 8 hours 05/29/17 2029 06/03/17 3664  05/26/17 2200  ceFAZolin (ANCEF) IVPB 1 g/50 mL premix  Status:  Discontinued     1 g 100 mL/hr over 30 Minutes Intravenous Every 8 hours 05/26/17 1008 05/29/17 2023   05/25/17 1100  vancomycin (VANCOCIN) 1,250 mg in sodium chloride 0.9 % 250 mL IVPB  Status:  Discontinued     1,250 mg 166.7 mL/hr over 90 Minutes Intravenous Every 12 hours 05/25/17 0955 05/26/17 0946   05/22/17 1400  anidulafungin (ERAXIS) 100 mg in sodium chloride 0.9 % 100 mL IVPB     100 mg 78 mL/hr over 100 Minutes Intravenous Every 24 hours 05/21/17 1330 05/27/17 1740   05/21/17 1400  anidulafungin (ERAXIS) 200 mg in sodium chloride 0.9 % 200 mL IVPB     200 mg 78 mL/hr over 200 Minutes Intravenous  Once 05/21/17 1330 05/21/17 1940   05/15/17 1000   anidulafungin (ERAXIS) 100 mg in sodium chloride 0.9 % 100 mL IVPB  Status:  Discontinued     100 mg 78 mL/hr over 100 Minutes Intravenous Every 24 hours 05/14/17 0829 05/16/17 0901   05/15/17 1000  metroNIDAZOLE (FLAGYL) IVPB 500 mg  Status:  Discontinued     500 mg 100 mL/hr over 60 Minutes Intravenous Every 8 hours 05/15/17 0903 05/25/17 0938   05/14/17 0900  anidulafungin (ERAXIS) 200 mg in sodium chloride 0.9 % 200 mL IVPB     200 mg 78 mL/hr over 200 Minutes Intravenous  Once 05/14/17 0829 05/14/17 1325   05/13/17 1927  vancomycin (VANCOCIN) 1-5 GM/200ML-% IVPB    Comments:  Ward, Christa   : cabinet override      05/13/17 1927 05/14/17 0744   05/12/17 1400  ceFEPIme (MAXIPIME) 1 g in dextrose 5 % 50 mL IVPB     1 g 100 mL/hr over 30 Minutes Intravenous Every 8 hours 05/12/17 0939 05/26/17 2359   05/12/17 0900  vancomycin (VANCOCIN) IVPB 1000 mg/200 mL premix  Status:  Discontinued     1,000 mg 200 mL/hr over 60 Minutes Intravenous Every 12 hours 05/12/17 0750 05/16/17 0852   05/12/17 0830  aztreonam (AZACTAM) 2 GM IVPB     2 g 100 mL/hr over 30 Minutes Intravenous  Once 05/12/17 0800 05/12/17 0957   05/06/17 0916  vancomycin (VANCOCIN) IVPB 1000 mg/200 mL premix     1,000 mg 200 mL/hr over 60 Minutes Intravenous On call to O.R. 05/06/17 0916 05/06/17 1229       Assessment/Plan HTN COPD Tobacco abuse - nicotine patch Malnutrition - improving, prealbumin 24.7 (9/3) Anemia - Hg 11.3 (9/3), recheck tomorrow Chronic pain - appreciate palliative assistance with pain management  OPEN RIGHT ADRENALECTOMY WITH SMALL BOWEL RESECTION AND ANASTOMOSIS - 05/06/2017 - [name] - Additional surgery - 05/13/2017, 05/14/2017, 05/17/2017 and 05/20/2017 - Currently with distal ileum in discontinuity and catheter drainage of the 2 bowel ends, and an EC fistula - CT 9/3 ok - Vac changes MWF - Dr. Hassell Done considering taking patient back to surgery later this month (2 months after prior  surgery)  FEN: IVF, TPN, sips of clears VTE: SCD's, lovenox ID: currently  Foley: none Follow up: TBD  DISPO: Continue therapies. Continue TPN and sips of clears. OR date per Dr. Hassell Done.   LOS: 73 days    Wellington Hampshire , [facility] 07/02/2017, 8:30 AM Pager: [phone] Consults: [phone] Mon-Fri 7:00 am-4:30 pm Sat-Sun 7:00 am-11:30 am

## 2017-07-02 NOTE — Progress Notes (Signed)
Occupational Therapy Treatment Patient Details Name: [name] [name] MRN: [id number] DOB: 1961-01-26 Today's Date: 07/02/2017    History of present illness  56 y.o. 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 On 7/18, he developed a leak therefore was taken back to OR for re-do of ex-lap and repair of leak on 05/20/17.  He returned to the ICU on the vent . Extubated on 05/28/17.    OT comments  Pt fatiqued from walking with RN.  Performed AROM to bil UEs and gave him handout with goal of performing daily with RN (as he needs to ensure that lines are free)  Follow Up Recommendations  SNF    Equipment Recommendations  3 in 1 bedside commode    Recommendations for Other Services      Precautions / Restrictions Precautions Precautions: Fall Precaution Comments: multiple lines in abdomen, VAC, , and JP drain Restrictions Weight Bearing Restrictions: No       Mobility Bed Mobility                  Transfers                      Balance                                           ADL either performed or assessed with clinical judgement   ADL                                               Vision       Perception     Praxis      Cognition Arousal/Alertness: Awake/alert Behavior During Therapy: WFL for tasks assessed/performed Overall Cognitive Status: Within Functional Limits for tasks assessed                                          Exercises Exercises: Other exercises Other Exercises Other Exercises: performed 20 FF and abduction to bil shoulders.  Printed 2 copies of exercises with goal of him doing daily when nursing staff is in the room.  Staff needed to ensure that lines are free    Shoulder Instructions        General Comments pt had just finished walking with RN. Did not want to do any more standing at this time.    Pertinent Vitals/ Pain       Faces Pain Scale: Hurts little more Pain Location: ABD Pain Intervention(s): Limited activity within patient's tolerance;Monitored during session  Home Living                                          Prior Functioning/Environment              Frequency           Progress Toward Goals  OT Goals(current goals can now be found in the  care plan section)  Progress towards OT goals: Progressing toward goals     Plan      Co-evaluation                 AM-PAC PT "6 Clicks" Daily Activity     Outcome Measure   Help from another person eating meals?: A Little Help from another person taking care of personal grooming?: A Little Help from another person toileting, which includes using toliet, bedpan, or urinal?: A Lot Help from another person bathing (including washing, rinsing, drying)?: A Lot Help from another person to put on and taking off regular upper body clothing?: A Little Help from another person to put on and taking off regular lower body clothing?: A Lot 6 Click Score: 15    End of Session        Activity Tolerance     Patient Left     Nurse Communication          Time: 1194-1740 OT Time Calculation (min): 8 min  Charges: OT General Charges $OT Visit: 1 Visit OT Treatments $Therapeutic Exercise: 8-22 mins  [name], OTR/L [phone] 07/02/2017   [employer] 07/02/2017, 2:05 PM

## 2017-07-02 NOTE — Progress Notes (Signed)
Mr. Bolds continues to improve. Still has nausea but only with PO intake. No vomiting. Improved sleep last PM. Will try to allow for uninterrupted sleep if possible-will request nursing help with this. His affect is much better-he is asking to get OOB now and is walking- he is severely deconditioned. Thrush improving now- would continue fluconazole-probably will need more than 7 days but will reassess after complete course.He believes it is affecting the way food tastes and causing the nausea.  In addition to symptom management I provided additional psychological support and encouragement. Discussed plan with his wife and also answered her questions. Reviewed most current CT per their request.  Plan: Increase Duragesic to 155mcg-otherwise maintain current regimen.  Lane Hacker, DO Palliative Medicine  Time: 35 minutes Greater than 50%  of this time was spent counseling and coordinating care related to the above assessment and plan.

## 2017-07-02 NOTE — Progress Notes (Signed)
PHARMACY - ADULT TOTAL PARENTERAL NUTRITION CONSULT NOTE   Pharmacy Consult for TPN Indication: prolonged ileus, small bowel leak, multiple bowel surgeries  Patient Measurements: Body mass index is 24.77 kg/m. Filed Weights   06/30/17 0342 07/01/17 0402 07/02/17 0500  Weight: 185 lb 10 oz (84.2 kg) 175 lb 4.3 oz (79.5 kg) 172 lb 9.9 oz (78.3 kg)   HPI: 55 yoM admitted on 7/11 for enlarging adrenal mass and planned adrenalectomy.  Pharmacy consulted to dose TPN.  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   7/22 OR:  wash out, tube ileostomy, left with open abdomen & wound vac in place.  Plan for OR on 7/25 for closure 7/25 OR: mesh placed for abdominal closure, wound vac 8/2 extubated, propofol drip off 8/4 TPN off for ~ 5 hours due to line detached from filter 8/10 MD request decrease rate x 1 week 2nd low sodium 8/12 Additional sodium added to TPN due to persistent hyponatremia 8/16 Increasing rate back to goal since sodium has improved with above intervention.   8/20 Prealbumin decreased.  New apparent enteric leak per surgery notes. 8/21 CaPhos product elevated, remove electrolytes from TPN 8/25 PICC did not have blood return in 2 of the 3 ports.  Alteplase intra-catheter and function returned.  TPN infusing correctly on 8/26 per RN. 8/28: d/c SSI 8/30: added electrolytes to TPN 8/31: TPN IV line came out around ~0340 and current bag was stopped at this time.  Restart new clinimix 5/15 1L bag (no lytes since phos is elevated this morning) until next bag is due at 6PM. Will not add MVI and trace elements to this 1L bag.  Use electrolyte free clinimix with new bag at 6PM 9/1 NGT out 9/3 added electrolytes to TPN 9/4 lytes out of TPN  Insulin requirements past 24 hours: not on insulin (no hx DM)  Current Nutrition: Clear Liquid Diet (8/21) - boost bid 8/22, decreased to 1/2 container 9/2, dc'd 9/3 - pro-stat 30 mL daily started on 8/27- pt refusing x 3 days  IVF: none. Lasix 20 IV qday  Central access: PICC line ordered 7/19, placed on 7/20 TPN start date: 7/20  ASSESSMENT                                                                                                           Today, 07/02/17  - Glucose (goal <150): at goal, but up to 144 this morning,  -  Electrolytes: K 3.5, Mag 1.7- at goal, but low end of range; Na 136- improved to WNL with Na 125 meq/L in TPN, phos 4.8, , CorrCa 10.7 , Ca/phos product = 51.4, goal is < 55- No electrolytes in TPN - Renal:  SCr wnl,  BUN up to 48,   -   LFTs: slightly elevated- AST 60, ALT 82, Alk Phos 240, T Bili 1.3 - TGs:   206 (9/3) - Prealbumin:  <5 (7/20), 6.1 (7/24), 10.1 (7/30) 24.8 (8/6), 26.4 (8/13), 15.9 (8/20), 19.7 (8/27) 24.7 (9/3)- within normal range - Nausea much improved after meds adjusted - 9/4 pall care dc dilaudid PCA>> fent patch, dc thorazine, continued reglan 10 IV q6h, PPI IV BID and pepcid 40 in TPN Started scheduled zofran, zyprexa, prn ativan, scheduled IV diazepam, rec trial of octreotide - 9/4 RN unable to get NGT to insert, pt requesting NGT under sedation by IR  NUTRITIONAL GOALS                                                                                             RD recs (9/3):  122-138 gms Protein (1.5 - 1.7 g/kg) 2030 - 2270 Kcal  Recalculation: Clinimix 5/15 at rate of 115 ml/hr with lipids 3x/week on MWF only would provide: 138g protein and avg 2164 kcal/day  - Glucose infusion rate will be 3.53 mg/kg/min (Maximum 5 mg/kg/min)  PLAN      Now:  Replete electrolytes to maintain WNL  Magnesium 1 gm  IV x 1   IV KCl x 3  runs today  At 1800 today: ? continue Clinimix 5/15 (No electrolytes) at 115 ml/hr due to elevated calcium phos product  Add sodium to TPN  (add 125 mEq of Na/L in a 1:1 Cl:Acetate ratio) for hyponatremia ? Lipids at 20mls/hr x 12 hours 3 times weekly on MWF  MVI/TE in TPN and pepcid 40 mg/day in TPN  TPN lab panels on Mondays & Thursdays.  Check BMET, Mag & Phos daily  Michelle , PharmD, BCPS Pager: [phone] 07/02/2017 7:22 AM  

## 2017-07-02 NOTE — Progress Notes (Signed)
Nutrition Follow-up  DOCUMENTATION CODES:   Not applicable  INTERVENTION:  - Continue TPN per Pharmacy; will continue to monitor LFTs and if TPN needs to be cycled in the future. - Will monitor for tolerance of sips of liquid.  NUTRITION DIAGNOSIS:   Inadequate oral intake related to inability to eat as evidenced by NPO status. -ongoing, allowed sips of clears.   GOAL:   Patient will meet greater than or equal to 90% of their needs -met with TPN regimen alone.   MONITOR:   Diet advancement, Weight trends, Labs, Skin, I & O's, Other (Comment) (TPN regimen)  ASSESSMENT:   56 y.o. 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 On 7/18, he developed a leak therefore was taken back to OR for re-do of ex-lap and repair of leak.  Significant events: 7/11 YB:OFBPZ adrenalectomy with complication of intestinal injury and small bowel resection with anastomosis  7/18 WC:HENIDP of small bowel anastomotic leak, abdominal closure of wound dehisence 7/19 OR: reopening of recent laparotomy, lysis of adhesions, noted ischemic perforation of new loop of intestine, intact repair of previous anastomotic leak repair, intact previous anastomosis.  7/22 OR: wash out, tube ileostomy, left with open abdomen &wound vac in place. Plan for OR on 7/25 for closure 7/25 OR: mesh placed for abdominal closure, wound vac 8/2extubated, propofol drip off 8/4TPN off for ~ 5 hours due to line detached from filter. Ultrasound-guided aspiration By interventional radiology of small perihepatic fluid collection yielded only 1 mL of the fluid, sent for culture. NPO - mild coffee ground on NG but no active bleed 8/6patient improving. Off Precedex drip since 8/4. Dilaudid Drip being changed over to PCA. Still with some coffee  ground-looking material in NG tube.  8/10 hyponatremia and plan to decrease TPN from 3L/day to 2L/day x1 week 8/49fund to have fecal matter in wound vac (EC fistula) 8/14increase in fecal matter presence in wound vac with no plan for surgery at this time 8/16TPN advanced to goal rate of 115 ml/hr with 20% ILE @ 20 mL/hr x12 hours on MWF 8/22: NGT clamped. RD to order Boost Breeze for patient to sip on; Mycostatin started for oral thrush. 9/1: pulled NGT and no plans for replacement. 9/1: developed N/V and only permitted sips of clears, if tolerated. 9/4: NGT re-insertion attempted several times without success   9/6 Pt with triple lumen PICC and continues with TPN regimen as outlined below; currently receiving electrolyte-free formula d/t persistent hyperphosphatemia. Pt permitted sips of clear liquids. Noted that on 9/3 pt was experiencing persistent nausea with dry heaving. Reviewed notes from yesterday and today which indicate that pt was requesting a diet d/t hunger and that he experienced nausea today with water. Per Brooke's, Surgery PA, note this AM, plan to continue with sips of clears only. Talked with her on the phone to ask if sips of full liquids would be feasible and she is going to talk with Dr. TMarlou Starkslater today about if this would be possible given complicated nature of pt, EC fistula. Her note also states that "Dr. MHassell Doneconsidering taking patient back to surgery later this month (2 months after prior surgery)." Weight fluctuating almost daily for the past 1 month; will continue to monitor trends.  Medications reviewed; 20 mg IV Lasix/day, 1 g IV Mg sulfate x1 run today, 10 mg IV  Reglan TID, 40 mg IV Protonix BID, 250 mg Florastor BID. Labs reviewed; Cl: 98 mmol/L, BUN: 48 mg/dL, glucose: 144 mg/dL, Phos: 4.8 mg/dL (trending down from yesterday), Alk Phos elevated and trending up, LFTs elevated today, triglycerides: 206 mg/dL on 9/3.    9/3 - Per Dr. Johney Maine' note on 9/1,  pt had pulled NGT and no plan for replacement and "most likely wait 6-12 months from last surgery to improve nutrition, wean off tobacco, and normal activity before attempting ex lap & reanastomosis (has leaked &[place] numerous prior attempts)."  - Consideration of drain study of R flank drain with possible plan for removal.  - Dr. Johney Maine' note this AM states sips of clears only, as tolerated, as pt was experiencing N/V on 9/1.  - Weight fluctuations since 8/10: 172-187 lbs. - Pt with triple lumen PICC and continues with Clinimix (E) 5/15 @ 115 mL/hr with 20% ILE @ 20 mL/hr x12 hours on MWF.  - Electrolytes being added back today as Phos now WDL.  - This regimen is providing a daily average of 138 grams of protein and 2165 kcal to meet 100% estimated protein and kcal need.    8/29 - Pt continues on CLD with Boost Breeze BID (accepting ~50% of the time) and Prostat once/day (has accepted both times it was offered).  - He reports drinking most of a Boost Breeze, no other liquids this AM.  - Plans to try a few sips of tea and to eat a jello later in the AM. - Last night he had a few sips of tea and a few spoonfuls of beef broth and did not experience abdominal pain or nausea with these items.  - Talked with pt about oral nutrition supplements and continued to encourage him to consume these items and the ability to use Boost Breeze to take medications.  - Encouraged sips throughout the day of any items on CLD. - No new weight since 8/26. - Spoke with Pharmacist earlier this AM concerning TPN regimen.  - Plan to maintain electrolyte-free formula today and re-assess tomorrow AM; hopefully Phos will drop a little lower.  - Pharmacist would like to wait until pt is transferred to the floor before transitioning to cyclic TPN.  - When that occurs, recommend: Clinimix 5/15/Clinimix E 5/15 @ 50 mL/hr x1 hour, @ 190 mL/hr x14 hours, @ 50 mL/hr x1 hour (16 hours on, 8 hours off; GIR for 190 mL/hr:  1.9).  4 g Mg sulfate x1 run yesterday and x1 run today,5 mg IV Reglan TID, 10 mEq IV KCl x6 runs today K: 3.1 mmol/L, Mg: 1.5 mg/dL  **SEE CHART FOR RD NOTES THROUGHOUT ADMISSION    Diet Order:  TPN (CLINIMIX) Adult without lytes Diet NPO time specified Except for: Ice Chips, Other (See Comments) TPN (CLINIMIX) Adult without lytes  Skin:  Wound (see comment) (Incisions to R flank and abdomen from 7/11 and 7/18)  Last BM:  9/1  Height:   Ht Readings from Last 1 Encounters:  05/26/17 '5\' 10"'$  (1.778 m)    Weight:   Wt Readings from Last 1 Encounters:  07/02/17 172 lb 9.9 oz (78.3 kg)    Ideal Body Weight:  75.45 kg  BMI:  Body mass index is 24.77 kg/m.  Estimated Nutritional Needs:   Kcal:  2030-2270 (25-28 kcal/kg)  Protein:  122-138 grams (1.5-1.7 grams/kg)  Fluid:  2 L/day  EDUCATION NEEDS:   No education needs identified at this time    Jarome Matin, MS,  RD, LDN, CNSC Inpatient Clinical Dietitian Pager # [phone] After hours/weekend pager # [phone]

## 2017-07-03 LAB — BASIC METABOLIC PANEL WITH GFR
Anion gap: 8 (ref 5–15)
BUN: 41 mg/dL — ABNORMAL HIGH (ref 6–20)
CO2: 25 mmol/L (ref 22–32)
Calcium: 9.8 mg/dL (ref 8.9–10.3)
Chloride: 100 mmol/L — ABNORMAL LOW (ref 101–111)
Creatinine, Ser: 0.88 mg/dL (ref 0.61–1.24)
GFR calc Af Amer: >60 mL/min
GFR calc non Af Amer: >60 mL/min
Glucose, Bld: 132 mg/dL — ABNORMAL HIGH (ref 65–99)
Potassium: 3.5 mmol/L (ref 3.5–5.1)
Sodium: 133 mmol/L — ABNORMAL LOW (ref 135–145)

## 2017-07-03 LAB — CBC
HCT: 33.6 % — ABNORMAL LOW (ref 39.0–52.0)
Hemoglobin: 10.8 g/dL — ABNORMAL LOW (ref 13.0–17.0)
MCH: 24.7 pg — AB (ref 26.0–34.0)
MCHC: 32.1 g/dL (ref 30.0–36.0)
MCV: 76.9 fL — AB (ref 78.0–100.0)
PLATELETS: 593 10*3/uL — AB (ref 150–400)
RBC: 4.37 MIL/uL (ref 4.22–5.81)
RDW: 16.8 % — AB (ref 11.5–15.5)
WBC: 11.7 10*3/uL — AB (ref 4.0–10.5)

## 2017-07-03 LAB — PHOSPHORUS: Phosphorus: 4.5 mg/dL (ref 2.5–4.6)

## 2017-07-03 LAB — MAGNESIUM: Magnesium: 1.7 mg/dL (ref 1.7–2.4)

## 2017-07-03 LAB — GLUCOSE, CAPILLARY: Glucose-Capillary: 131 mg/dL — ABNORMAL HIGH (ref 65–99)

## 2017-07-03 MED ORDER — INSULIN ASPART 100 UNIT/ML ~~LOC~~ SOLN
0.0000 [IU] | SUBCUTANEOUS | Status: DC
  Administered 2017-07-03 – 2017-07-04 (×2): 1 [IU] via SUBCUTANEOUS

## 2017-07-03 MED ORDER — TRACE MINERALS CR-CU-MN-SE-ZN 10-1000-500-60 MCG/ML IV SOLN
INTRAVENOUS | Status: AC
  Administered 2017-07-03: 18:00:00 via INTRAVENOUS
  Filled 2017-07-03: qty 2644
  Filled 2017-07-03: qty 2000

## 2017-07-03 MED ORDER — MAGNESIUM SULFATE 4 GM/100ML IV SOLN
4.0000 g | Freq: Once | INTRAVENOUS | Status: AC
  Administered 2017-07-03: 4 g via INTRAVENOUS
  Filled 2017-07-03: qty 100

## 2017-07-03 MED ORDER — MAGNESIUM SULFATE 2 GM/50ML IV SOLN
2.0000 g | Freq: Once | INTRAVENOUS | Status: AC
  Administered 2017-07-03: 2 g via INTRAVENOUS
  Filled 2017-07-03: qty 50

## 2017-07-03 MED ORDER — TRACE MINERALS CR-CU-MN-SE-ZN 10-1000-500-60 MCG/ML IV SOLN
INTRAVENOUS | Status: DC
  Filled 2017-07-03: qty 2760

## 2017-07-03 MED ORDER — FAT EMULSION 20 % IV EMUL
240.0000 mL | INTRAVENOUS | Status: DC
  Filled 2017-07-03: qty 250

## 2017-07-03 MED ORDER — FAT EMULSION 20 % IV EMUL
240.0000 mL | INTRAVENOUS | Status: AC
  Administered 2017-07-03: 240 mL via INTRAVENOUS
  Filled 2017-07-03: qty 250

## 2017-07-03 MED ORDER — POTASSIUM CHLORIDE 10 MEQ/50ML IV SOLN
10.0000 meq | INTRAVENOUS | Status: AC
  Administered 2017-07-03 (×6): 10 meq via INTRAVENOUS
  Filled 2017-07-03 (×6): qty 50

## 2017-07-03 MED ORDER — BISACODYL 10 MG RE SUPP
10.0000 mg | Freq: Once | RECTAL | Status: AC
  Administered 2017-07-04: 10 mg via RECTAL
  Filled 2017-07-03: qty 1

## 2017-07-03 NOTE — Progress Notes (Addendum)
[facility] NOTE   Pharmacy Consult for TPN Indication: prolonged ileus, small bowel leak, multiple bowel surgeries  Patient Measurements: Body mass index is 24.93 kg/m. Filed Weights   07/01/17 0402 07/02/17 0500 07/03/17 0450  Weight: 175 lb 4.3 oz (79.5 kg) 172 lb 9.9 oz (78.3 kg) 173 lb 11.6 oz (78.8 kg)   HPI: 76 yoM admitted on 7/11 for enlarging adrenal mass and planned adrenalectomy.  Pharmacy consulted to dose TPN.  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   7/22 OR:  wash out, tube ileostomy, left with open abdomen & wound vac in place.  Plan for OR on 7/25 for closure 7/25 OR: mesh placed for abdominal closure, wound vac 8/2 extubated, propofol drip off 8/4 TPN off for ~ 5 hours due to line detached from filter 8/10 MD request decrease rate x 1 week 2nd low sodium 8/12 Additional sodium added to TPN due to persistent hyponatremia 8/16 Increasing rate back to goal since sodium has improved with above intervention.   8/20 Prealbumin decreased.  New apparent enteric leak per surgery notes. 8/21 CaPhos product elevated, remove electrolytes from TPN 8/25 PICC did not have blood return in 2 of the 3 ports.  Alteplase intra-catheter and function returned.  TPN infusing correctly on 8/26 per RN. 8/28: d/c SSI 8/30: added electrolytes to TPN 8/31: TPN IV line came out around ~0340 and current bag was stopped at this time.  Restart new clinimix 5/15 1L bag (no lytes since phos is elevated this morning) until next bag is [facility] at [facility]. Will not add MVI and trace elements to this 1L  bag. Use electrolyte free clinimix with new bag at [facility] 9/1 NGT out 9/3 added electrolytes to TPN 9/4 lytes out of TPN  Insulin requirements past 24 hours: not on insulin (no hx DM)  Current Nutrition: NPO x ice chips 9/5  IVF: none. Lasix 20 IV qday  Central access: PICC line ordered 7/19, placed on 7/20 TPN start date: 7/20  ASSESSMENT                                                                                                           Today, 07/03/17  - Glucose (goal <150): at goal,  -  Electrolytes: K 3.5 after 3 runs yesterday, Mag 1.7- after 1 gm yesterday, Na 133 with Na 125 meq/L in TPN, phos 4.5, , CorrCa 10.44 , Ca/phos product = 47,  goal is < 55- No electrolytes in TPN - Renal:  SCr wnl,  BUN elevated at 41   - LFTs: slightly elevated- AST 60, ALT 82, Alk Phos 240, T Bili 1.3 - TGs:   206 (9/3) - Prealbumin:  <5 (7/20), 6.1 (7/24), 10.1 (  7/30) 24.8 (8/6), 26.4 (8/13), 15.9 (8/20), 19.7 (8/27) 24.7 (9/3)- within normal range -  9/4 pall care dc dilaudid PCA>> fent patch, dc thorazine, continued reglan 10 IV q6h, PPI IV BID and pepcid 40 in TPN Started scheduled zofran, zyprexa, prn ativan, scheduled IV diazepam, rec trial of octreotide - 9/4 RN unable to get NGT to insert -  9/6 Nausea much improved after meds adjusted NUTRITIONAL GOALS                                                                                             RD recs (9/3):  122-138 gms Protein (1.5 - 1.7 g/kg) 2030 - 2270 Kcal  Recalculation: Clinimix 5/15 at rate of 115 ml/hr with lipids 3x/week on MWF only would provide: 138g protein and avg 2164 kcal/day  - Glucose infusion rate will be 3.53 mg/kg/min (Maximum 5 mg/kg/min)  PLAN      Now:  Replete electrolytes to maintain WNL  Magnesium 6 gm  IV x 1   IV KCl x 6 runs today  At 1800 today: ? continue Clinimix 5/15 (No electrolytes) due to elevated calcium phos product when lytes added to TPN ? Change rate from 115 ml/hr to equivalent of 110  ml/hr and change to 18 hr cycle and assess tolerance ? This will provide 132 gm protein and 2080 Kcal meeting 100% of needs  Add sodium to TPN  (add 125 mEq of Na/L in a 1:1 BE:MLJQGBE ratio) for hyponatremia ? Lipids at 5ms/hr x 12 hours 3 times weekly on MWF  MVI/TE in TPN and pepcid 40 mg/day in TPN  TPN lab panels on Mondays & Thursdays.  Check BMET, Mag & Phos daily  Custom SSI while transitioning to cyclic rate to assess tolerance  MEudelia Bunch Pharm.D. [id number]/04/2017 7:36 AM

## 2017-07-03 NOTE — Progress Notes (Signed)
NUTRITION NOTE  Pt discussed in rounds this AM. Pharmacist reports plan to begin cycling TPN; see note today at 0707 for further details on this plan. Per this RD's note on 8/29, recommended Clinimix (E) 5/15 x16 hours (50 mL/hr x1 hour, 190 mL/hr x14 hours, 50 mL/hr x1 hour; total volume: 2760 mL) which would provide a daily average (if ILE continued on to be provided MWF) of 2166 kcal and 138 grams of protein. Pharmacist states hope of being able to cycle down to only 14 hours/day.       Jarome Matin, MS, RD, LDN, [facility] Inpatient Clinical Dietitian Pager # [phone] After hours/weekend pager # [phone]

## 2017-07-03 NOTE — Consult Note (Signed)
[place] Nurse wound consult note Reason for Consult:NPWT dressing change, no assistance today. (Dr. Marlou Starks in just after wound vac applied.) Wound type:surgical full thickness Pressure Injury POA: NA Measurement:see Monday Wound bed:75% red moist granulating tissue, 25% exposed mesh, with brown feculent material.  Drainage (amount, consistency, odor) brown on white foam, in tubing and in canister. Periwound:less maceration than photo from Wednesday today, will continue extra drape around perimeter. Dressing procedure/placement/frequency:Patient medicated with IV pain medication. I went to get supplies from portable equipment. Placed extra supplies in room for Monday. Removed drape with spray adhesive remover, removed one white foam and one black foam. Cleansed wound with NS, patted dry, placed new pieces of one white foam and one black foam, drape applied, TRAC pad applied and negative pressure at 179mmHg was started with immediate seal. Patient tolerated well.  [place] team with continue to follow, remain available to this patient, nursing, and medical, surgical teams.   Fara Olden, RN-C, WTA-C,OCA Wound Treatment Associate

## 2017-07-03 NOTE — Progress Notes (Signed)
44 Days Post-Op   Subjective/Chief Complaint: No complaints. Feels a little better   Objective: Vital signs in last 24 hours: Temp:  [97.4 F (36.3 C)-99.1 F (37.3 C)] 97.5 F (36.4 C) (09/07 1200) Pulse Rate:  [96-111] 97 (09/07 1000) Resp:  [18-28] 18 (09/07 1000) BP: (102-143)/(74-88) 109/82 (09/07 1000) SpO2:  [98 %-100 %] 98 % (09/07 1000) Weight:  [78.8 kg (173 lb 11.6 oz)] 78.8 kg (173 lb 11.6 oz) (09/07 0450) Last BM Date: 06/27/17  Intake/Output from previous day: 09/06 0701 - 09/07 0700 In: 2580 [I.V.:2530; IV Piggyback:50] Out: 1945 [Urine:1200; Drains:745] Intake/Output this shift: Total I/O In: -  Out: 800 [Urine:800]  General appearance: alert and cooperative Resp: clear to auscultation bilaterally Cardio: regular rate and rhythm GI: soft, vac and tubes in place  Lab Results:   Recent Labs  07/03/17 0318  WBC 11.7*  HGB 10.8*  HCT 33.6*  PLT 593*   BMET  Recent Labs  07/02/17 0617 07/03/17 0318  NA 136 133*  K 3.5 3.5  CL 98* 100*  CO2 26 25  GLUCOSE 144* 132*  BUN 48* 41*  CREATININE 0.97 0.88  CALCIUM 10.1 9.8   PT/INR No results for input(s): LABPROT, INR in the last 72 hours. ABG No results for input(s): PHART, HCO3 in the last 72 hours.  Invalid input(s): PCO2, PO2  Studies/Results: No results found.  Anti-infectives: Anti-infectives    Start     Dose/Rate Route Frequency Ordered Stop   07/01/17 1500  fluconazole (DIFLUCAN) IVPB 100 mg     100 mg 50 mL/hr over 60 Minutes Intravenous Every 24 hours 07/01/17 1401 07/08/17 1459   06/16/17 1200  piperacillin-tazobactam (ZOSYN) IVPB 3.375 g     3.375 g 12.5 mL/hr over 240 Minutes Intravenous Every 8 hours 06/16/17 1026 06/17/17 0004   06/08/17 1200  piperacillin-tazobactam (ZOSYN) IVPB 3.375 g  Status:  Discontinued     3.375 g 12.5 mL/hr over 240 Minutes Intravenous Every 8 hours 06/08/17 1056 06/16/17 1026   05/29/17 2200  ceFEPIme (MAXIPIME) 2 g in dextrose 5 % 50 mL  IVPB  Status:  Discontinued     2 g 100 mL/hr over 30 Minutes Intravenous Every 8 hours 05/29/17 2031 06/03/17 0832   05/29/17 2100  metroNIDAZOLE (FLAGYL) IVPB 500 mg  Status:  Discontinued     500 mg 100 mL/hr over 60 Minutes Intravenous Every 8 hours 05/29/17 2029 06/03/17 0832   05/26/17 2200  ceFAZolin (ANCEF) IVPB 1 g/50 mL premix  Status:  Discontinued     1 g 100 mL/hr over 30 Minutes Intravenous Every 8 hours 05/26/17 1008 05/29/17 2023   05/25/17 1100  vancomycin (VANCOCIN) 1,250 mg in sodium chloride 0.9 % 250 mL IVPB  Status:  Discontinued     1,250 mg 166.7 mL/hr over 90 Minutes Intravenous Every 12 hours 05/25/17 0955 05/26/17 0946   05/22/17 1400  anidulafungin (ERAXIS) 100 mg in sodium chloride 0.9 % 100 mL IVPB     100 mg 78 mL/hr over 100 Minutes Intravenous Every 24 hours 05/21/17 1330 05/27/17 1740   05/21/17 1400  anidulafungin (ERAXIS) 200 mg in sodium chloride 0.9 % 200 mL IVPB     200 mg 78 mL/hr over 200 Minutes Intravenous  Once 05/21/17 1330 05/21/17 1940   05/15/17 1000  anidulafungin (ERAXIS) 100 mg in sodium chloride 0.9 % 100 mL IVPB  Status:  Discontinued     100 mg 78 mL/hr over 100 Minutes Intravenous Every 24 hours  05/14/17 0829 05/16/17 0901   05/15/17 1000  metroNIDAZOLE (FLAGYL) IVPB 500 mg  Status:  Discontinued     500 mg 100 mL/hr over 60 Minutes Intravenous Every 8 hours 05/15/17 0903 05/25/17 0938   05/14/17 0900  anidulafungin (ERAXIS) 200 mg in sodium chloride 0.9 % 200 mL IVPB     200 mg 78 mL/hr over 200 Minutes Intravenous  Once 05/14/17 0829 05/14/17 1325   05/13/17 1927  vancomycin (VANCOCIN) 1-5 GM/200ML-% IVPB    Comments:  Ward, Christa   : cabinet override      05/13/17 1927 05/14/17 0744   05/12/17 1400  ceFEPIme (MAXIPIME) 1 g in dextrose 5 % 50 mL IVPB     1 g 100 mL/hr over 30 Minutes Intravenous Every 8 hours 05/12/17 0939 05/26/17 2359   05/12/17 0900  vancomycin (VANCOCIN) IVPB 1000 mg/200 mL premix  Status:  Discontinued      1,000 mg 200 mL/hr over 60 Minutes Intravenous Every 12 hours 05/12/17 0750 05/16/17 0852   05/12/17 0830  aztreonam (AZACTAM) 2 GM IVPB     2 g 100 mL/hr over 30 Minutes Intravenous  Once 05/12/17 0800 05/12/17 0957   05/06/17 0916  vancomycin (VANCOCIN) IVPB 1000 mg/200 mL premix     1,000 mg 200 mL/hr over 60 Minutes Intravenous On call to O.R. 05/06/17 0916 05/06/17 1229      Assessment/Plan: s/p Procedure(s): ABDOMINAL WOUND [place] OUT, WOUND CLOSURE AND WOUND VAC PLACEMENT (N/A) Continue tpn for nutrition support Allow clears Plan for surgery later in september  LOS: 58 days    TOTH III,PAUL S 07/03/2017

## 2017-07-03 NOTE — Progress Notes (Signed)
Physical Therapy Treatment Patient Details Name: [name] [name] MRN: [id number] DOB: 1961/01/31 Today's Date: 07/03/2017    History of Present Illness  56 y.o. 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 On 7/18, he developed a leak therefore was taken back to OR for re-do of ex-lap and repair of leak on 05/20/17.  He returned to the ICU on the vent . Extubated on 05/28/17.     PT Comments    Assisted OOB + 2 for safety multiple ines/leads/drains to amb a greater distance.  Highest HR was 133.  Improved.     Follow Up Recommendations  SNF;Supervision/Assistance - 24 hour     Equipment Recommendations  Rolling walker with 5" wheels    Recommendations for Other Services       Precautions / Restrictions Precautions Precautions: Fall Precaution Comments: multiple lines in abdomen, VAC, , and JP drain Restrictions Weight Bearing Restrictions: No    Mobility  Bed Mobility Overal bed mobility: Needs Assistance Bed Mobility: Supine to Sit     Supine to sit: +2 for safety/equipment;Min guard     General bed mobility comments: + 2 to watch multiple lines/tubes    pt able to self rise with use of rail  Transfers Overall transfer level: Needs assistance Equipment used: Rolling walker (2 wheeled) Transfers: Sit to/from [facility] Sit to Stand: Min guard;+2 safety/equipment;Min assist         General transfer comment: Min a to help control descent to chair; assist for multiple lines  Ambulation/Gait Ambulation/Gait assistance: Min guard;+2 safety/equipment Ambulation Distance (Feet): 220 Feet (110 feet x 2 one sitting rest break) Assistive device: Rolling walker (2 wheeled) Gait Pattern/deviations: Step-to pattern;Step-through pattern     General Gait Details: tolerated an  increaesed amb distance.  Highest HR was 133.     Stairs            Wheelchair Mobility    Modified Rankin (Stroke Patients Only)       Balance                                            Cognition Arousal/Alertness: Awake/alert Behavior During Therapy: WFL for tasks assessed/performed Overall Cognitive Status: Within Functional Limits for tasks assessed                                 General Comments: pleasant/cooperative      Exercises      General Comments        Pertinent Vitals/Pain Pain Assessment: Faces Faces Pain Scale: Hurts a little bit Pain Location: ABD with activity Pain Descriptors / Indicators: Sore;Grimacing Pain Intervention(s): Monitored during session;Repositioned    Home Living                      Prior Function            PT Goals (current goals can now be found in the care plan section) Progress towards PT goals: Progressing toward goals    Frequency    Min 3X/week      PT Plan Current plan remains appropriate    Co-evaluation  AM-PAC PT "6 Clicks" Daily Activity  Outcome Measure  Difficulty turning over in bed (including adjusting bedclothes, sheets and blankets)?: A Lot Difficulty moving from lying on back to sitting on the side of the bed? : A Lot Difficulty sitting down on and standing up from a chair with arms (e.g., wheelchair, bedside commode, etc,.)?: A Lot Help needed moving to and from a bed to chair (including a wheelchair)?: A Little Help needed walking in hospital room?: A Little Help needed climbing 3-5 steps with a railing? : Total 6 Click Score: 13    End of Session Equipment Utilized During Treatment: Gait belt Activity Tolerance: Patient tolerated treatment well Patient left: in chair;with call bell/phone within reach;with family/visitor present Nurse Communication: Mobility status PT Visit Diagnosis: Difficulty in walking, not elsewhere  classified (R26.2);Muscle weakness (generalized) (M62.81)     Time: 5945-8592 PT Time Calculation (min) (ACUTE ONLY): 24 min  Charges:  $Gait Training: 8-22 mins $Therapeutic Activity: 8-22 mins                    G Codes:       [name]  PTA [facility]  Acute  Rehab Pager      [phone]

## 2017-07-04 LAB — GLUCOSE, CAPILLARY
GLUCOSE-CAPILLARY: 94 mg/dL (ref 65–99)
GLUCOSE-CAPILLARY: 92 mg/dL (ref 65–99)
GLUCOSE-CAPILLARY: 147 mg/dL — AB (ref 65–99)
Glucose-Capillary: 121 mg/dL — ABNORMAL HIGH (ref 65–99)

## 2017-07-04 LAB — BASIC METABOLIC PANEL
Anion gap: 10 (ref 5–15)
BUN: 39 mg/dL — AB (ref 6–20)
CALCIUM: 9.6 mg/dL (ref 8.9–10.3)
CO2: 19 mmol/L — ABNORMAL LOW (ref 22–32)
Chloride: 105 mmol/L (ref 101–111)
Creatinine, Ser: 0.84 mg/dL (ref 0.61–1.24)
GFR calc Af Amer: >60 mL/min (ref >60)
GLUCOSE: 99 mg/dL (ref 65–99)
Potassium: 3.8 mmol/L (ref 3.5–5.1)
Sodium: 134 mmol/L — ABNORMAL LOW (ref 135–145)

## 2017-07-04 LAB — MAGNESIUM: MAGNESIUM: 1.7 mg/dL (ref 1.7–2.4)

## 2017-07-04 LAB — PHOSPHORUS: PHOSPHORUS: 3.5 mg/dL (ref 2.5–4.6)

## 2017-07-04 MED ORDER — FAMOTIDINE 200 MG/20ML IV SOLN
INTRAVENOUS | Status: AC
Start: 2017-07-04 — End: 2017-07-05
  Administered 2017-07-04: 18:00:00 via INTRAVENOUS
  Filled 2017-07-04: qty 2644
  Filled 2017-07-04: qty 2000

## 2017-07-04 MED ORDER — MAGNESIUM SULFATE 2 GM/50ML IV SOLN
2.0000 g | Freq: Once | INTRAVENOUS | Status: AC
  Administered 2017-07-04: 2 g via INTRAVENOUS
  Filled 2017-07-04: qty 50

## 2017-07-04 MED ORDER — INSULIN ASPART 100 UNIT/ML ~~LOC~~ SOLN
0.0000 [IU] | SUBCUTANEOUS | Status: DC
  Administered 2017-07-04 – 2017-07-05 (×2): 1 [IU] via SUBCUTANEOUS

## 2017-07-04 NOTE — Progress Notes (Signed)
PHARMACY - ADULT TOTAL PARENTERAL NUTRITION CONSULT NOTE   Pharmacy Consult for TPN Indication: prolonged ileus, small bowel leak, multiple bowel surgeries  Patient Measurements: Body mass index is 25.21 kg/m. Filed Weights   07/02/17 0500 07/03/17 0450 07/04/17 0414  Weight: 172 lb 9.9 oz (78.3 kg) 173 lb 11.6 oz (78.8 kg) 175 lb 11.3 oz (79.7 kg)   HPI: 15 yoM admitted on 7/11 for enlarging adrenal mass and planned adrenalectomy.  Pharmacy consulted to dose TPN.  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   7/22 OR:  wash out, tube ileostomy, left with open abdomen & wound vac in place.  Plan for OR on 7/25 for closure 7/25 OR: mesh placed for abdominal closure, wound vac 8/2 extubated, propofol drip off 8/4 TPN off for ~ 5 hours due to line detached from filter 8/10 MD request decrease rate x 1 week 2nd low sodium 8/12 Additional sodium added to TPN due to persistent hyponatremia 8/16 Increasing rate back to goal since sodium has improved with above intervention.   8/20 Prealbumin decreased.  New apparent enteric leak per surgery notes. 8/21 CaPhos product elevated, remove electrolytes from TPN 8/25 PICC did not have blood return in 2 of the 3 ports.  Alteplase intra-catheter and function returned.  TPN infusing correctly on 8/26 per RN. 8/28: d/c SSI 8/30: added electrolytes to TPN 8/31: TPN IV line came out around ~0340 and current bag was stopped at this time.  Restart new clinimix 5/15 1L bag (no lytes since phos is elevated this morning) until next bag is due at [facility]. Will not add MVI and trace elements to this 1L  bag. Use electrolyte free clinimix with new bag at [facility] 9/1 NGT out 9/3 added electrolytes to TPN 9/4 lytes out of TPN 9/7 cycle over 18 hrs 9/8 add back electrolytes to TPN and cycle over 14 hours  Insulin requirements past 24 hours: 2 units SSI (no hx DM)  Current Nutrition: NPO x ice chips 9/5  IVF: none. Lasix 20 IV qday  Central access: PICC line ordered 7/19, placed on 7/20 TPN start date: 7/20  ASSESSMENT                                                                                                           Today, 07/04/17  - Glucose (goal <150): at goal, CBG 131 and 121 and 99 while cycle infusing at 159 ml/hr -  Electrolytes: K 3.8 after 5 runs yesterday, Mag 1.7- after 6 gm yesterday, Na134 with Na 125 meq/L in TPN,  phos 3.5, , CorrCa 10.24 , Ca/phos product = 36,  goal is < 55- No electrolytes in TPN - Renal:  SCr wnl,  BUN elevated at 39   -  LFTs: slightly elevated- AST 60, ALT 82, Alk Phos 240, T Bili 1.3 - TGs:   206 (9/3) - Prealbumin:  <5 (7/20), 6.1 (7/24), 10.1 (7/30) 24.8 (8/6), 26.4 (8/13), 15.9 (8/20), 19.7 (8/27) 24.7 (9/3)- within normal range -  9/4 pall care dc dilaudid PCA>> fent patch, dc thorazine, continued reglan 10 IV q6h, PPI IV BID and pepcid 40 in TPN Started scheduled zofran, zyprexa, prn ativan, scheduled IV diazepam, rec trial of octreotide - 9/4 RN unable to get NGT to insert -  9/6 Nausea much improved after meds adjusted NUTRITIONAL GOALS                                                                                             RD recs (9/3):  122-138 gms Protein (1.5 - 1.7 g/kg) 2030 - 2270 Kcal  Recalculation: Clinimix 5/15 at rate of 115 ml/hr with lipids 3x/week on MWF only would provide: 138g protein and avg 2164 kcal/day  - Glucose infusion rate will be 3.53 mg/kg/min (Maximum 5 mg/kg/min)  PLAN      Now:  Replete electrolytes to maintain WNL  Magnesium 2 gm  IV x 1   At 1800 today: ? Add back electrolytes: change to Clinimix E  5/15  ? Decrease cycle from 18 hr to 14 hr cycle and assess tolerance ? This will provide 132 gm protein and 2080 Kcal meeting 100% of needs ? Lipids at 85ms/hr x 12 hours 3 times weekly on MWF  MVI/TE in TPN and pepcid 40 mg/day in TPN  TPN lab panels on Mondays & Thursdays.  Check BMET in AM, Mag & Phos daily  Custom SSI while transitioning to cyclic rate to assess tolerance  MEudelia Bunch Pharm.D. [phone]/05/2017 7:16 AM

## 2017-07-04 NOTE — Progress Notes (Signed)
Physical Therapy Treatment Patient Details Name: [name] [name] MRN: [id number] DOB: [birth year] Today's Date: 07/04/2017    History of Present Illness  56 y.o. 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 On 7/18, he developed a leak therefore was taken back to OR for re-do of ex-lap and repair of leak on 05/20/17.  He returned to the ICU on the vent . Extubated on 05/28/17.     PT Comments    Pt motivated this date and showing steady progress with ambulated distance and balance.   Follow Up Recommendations  SNF;Supervision/Assistance - 24 hour     Equipment Recommendations  Rolling walker with 5" wheels    Recommendations for Other Services Rehab consult     Precautions / Restrictions Precautions Precautions: Fall Precaution Comments: multiple lines in abdomen, VAC, , and JP drain Restrictions Weight Bearing Restrictions: No    Mobility  Bed Mobility Overal bed mobility: Needs Assistance Bed Mobility: Supine to Sit Rolling: Min guard Sidelying to sit: Min assist       General bed mobility comments: + 2 to watch multiple lines/tubes    Transfers Overall transfer level: Needs assistance Equipment used: Rolling walker (2 wheeled) Transfers: Sit to/from Stand Sit to Stand: Min assist;Min guard;+2 safety/equipment         General transfer comment: Min a to help control descent to chair; assist for multiple lines  Ambulation/Gait Ambulation/Gait assistance: Min guard;+2 safety/equipment Ambulation Distance (Feet): 300 Feet (150' twice with one seated rest break) Assistive device: Rolling walker (2 wheeled) Gait Pattern/deviations: Step-through pattern;Decreased step length - right;Decreased step length - left;Shuffle;Trunk flexed Gait velocity: decr Gait velocity interpretation: Below  normal speed for age/gender General Gait Details: tolerated an increased amb distance.  Highest HR was 133.     Stairs            Wheelchair Mobility    Modified Rankin (Stroke Patients Only)       Balance Overall balance assessment: Needs assistance Sitting-balance support: Feet supported;Bilateral upper extremity supported Sitting balance-Leahy Scale: Fair     Standing balance support: During functional activity Standing balance-Leahy Scale: Fair                              Cognition Arousal/Alertness: Awake/alert Behavior During Therapy: WFL for tasks assessed/performed Overall Cognitive Status: Within Functional Limits for tasks assessed                                 General Comments: pleasant/cooperative      Exercises      General Comments        Pertinent Vitals/Pain Pain Assessment: 0-10 Pain Score: 6  Pain Location: ABD with activity Pain Descriptors / Indicators: Sore;Grimacing Pain Intervention(s): Limited activity within patient's tolerance;Monitored during session    Home Living                      Prior Function            PT Goals (current goals can now be found in the care plan section) Acute Rehab PT Goals Patient Stated Goal: to go fishing PT Goal Formulation: With patient/family Time For Goal Achievement: 07/14/17 Potential to Achieve Goals: Fair Progress  towards PT goals: Progressing toward goals    Frequency    Min 3X/week      PT Plan Current plan remains appropriate    Co-evaluation              AM-PAC PT "6 Clicks" Daily Activity  Outcome Measure  Difficulty turning over in bed (including adjusting bedclothes, sheets and blankets)?: A Lot Difficulty moving from lying on back to sitting on the side of the bed? : A Lot Difficulty sitting down on and standing up from a chair with arms (e.g., wheelchair, bedside commode, etc,.)?: A Lot Help needed moving to and from a  bed to chair (including a wheelchair)?: A Little Help needed walking in hospital room?: A Little Help needed climbing 3-5 steps with a railing? : Total 6 Click Score: 13    End of Session   Activity Tolerance: Patient tolerated treatment well Patient left: in chair;with call bell/phone within reach;with family/visitor present Nurse Communication: Mobility status PT Visit Diagnosis: Difficulty in walking, not elsewhere classified (R26.2);Muscle weakness (generalized) (M62.81)     Time: 2500-3704 PT Time Calculation (min) (ACUTE ONLY): 24 min  Charges:  $Gait Training: 23-37 mins                    G Codes:       Pg [phone]    [name] [name] 07/04/2017, 4:47 PM

## 2017-07-04 NOTE — Progress Notes (Signed)
  General Surgery [facility]  Assessment & Plan: Status post ex lap, small bowel resection, open abd wound  TNA, sips clear liquids  NG tube removed  Awaiting reconstructive abdominal surgery later this month         Earnstine Regal, MD, [facility]       Office: [phone]    Chief Complaint: Discontinuity of GI tract   Subjective: Patient awake and alert, no complaints this AM.  Objective: Vital signs in last 24 hours: Temp:  [97.5 F (36.4 C)-98.1 F (36.7 C)] 97.5 F (36.4 C) (09/08 0750) Pulse Rate:  [90-104] 103 (09/08 0600) Resp:  [17-24] 21 (09/08 0600) BP: (95-126)/(71-100) 112/76 (09/08 0600) SpO2:  [98 %-100 %] 99 % (09/08 0600) Weight:  [79.7 kg (175 lb 11.3 oz)] 79.7 kg (175 lb 11.3 oz) (09/08 0414) Last BM Date: 06/27/17  Intake/Output from previous day: 09/07 0701 - 09/08 0700 In: 2136.2 [P.O.:120; I.V.:2016.2] Out: 2020 [Urine:1350; Drains:670] Intake/Output this shift: Total I/O In: -  Out: 300 [Urine:300]  Physical Exam: HEENT - sclerae clear, mucous membranes moist Neck - soft Abdomen - soft; VAC intact RUQ; drains with thin bilious output Ext - no edema, non-tender Neuro - alert & oriented, no focal deficits  Lab Results:   Recent Labs  07/03/17 0318  WBC 11.7*  HGB 10.8*  HCT 33.6*  PLT 593*   BMET  Recent Labs  07/03/17 0318 07/04/17 0323  NA 133* 134*  K 3.5 3.8  CL 100* 105  CO2 25 19*  GLUCOSE 132* 99  BUN 41* 39*  CREATININE 0.88 0.84  CALCIUM 9.8 9.6   PT/INR No results for input(s): LABPROT, INR in the last 72 hours. Comprehensive Metabolic Panel:    Component Value Date/Time   NA 134 (L) 07/04/2017 0323   NA 133 (L) 07/03/2017 0318   K 3.8 07/04/2017 0323   K 3.5 07/03/2017 0318   CL 105 07/04/2017 0323   CL 100 (L) 07/03/2017 0318   CO2 19 (L) 07/04/2017 0323   CO2 25 07/03/2017 0318   BUN 39 (H) 07/04/2017 0323   BUN 41 (H) 07/03/2017 0318   CREATININE 0.84 07/04/2017 0323   CREATININE 0.88 07/03/2017 0318   CREATININE 0.85 07/22/2013 1021   GLUCOSE 99 07/04/2017 0323   GLUCOSE 132 (H) 07/03/2017 0318   CALCIUM 9.6 07/04/2017 0323   CALCIUM 9.8 07/03/2017 0318   AST 60 (H) 07/02/2017 0617   AST 25 06/29/2017 0434   ALT 82 (H) 07/02/2017 0617   ALT 27 06/29/2017 0434   ALKPHOS 240 (H) 07/02/2017 0617   ALKPHOS 196 (H) 06/29/2017 0434   BILITOT 1.3 (H) 07/02/2017 0617   BILITOT 1.1 06/29/2017 0434   PROT 8.2 (H) 07/02/2017 0617   PROT 7.9 06/29/2017 0434   ALBUMIN 3.2 (L) 07/02/2017 0617   ALBUMIN 3.1 (L) 06/29/2017 0434    Studies/Results: No results found.    [name] [name] M 07/04/2017  Patient ID: [name] [name], male   DOB: 08-29-1961, 56 y.o.   MRN: [id number]

## 2017-07-05 LAB — GLUCOSE, CAPILLARY
GLUCOSE-CAPILLARY: 135 mg/dL — AB (ref 65–99)
GLUCOSE-CAPILLARY: 102 mg/dL — AB (ref 65–99)
GLUCOSE-CAPILLARY: 94 mg/dL (ref 65–99)

## 2017-07-05 LAB — BASIC METABOLIC PANEL
ANION GAP: 8 (ref 5–15)
BUN: 38 mg/dL — ABNORMAL HIGH (ref 6–20)
CO2: 20 mmol/L — ABNORMAL LOW (ref 22–32)
Calcium: 9.2 mg/dL (ref 8.9–10.3)
Chloride: 106 mmol/L (ref 101–111)
Creatinine, Ser: 0.83 mg/dL (ref 0.61–1.24)
GFR calc Af Amer: >60 mL/min (ref >60)
Glucose, Bld: 122 mg/dL — ABNORMAL HIGH (ref 65–99)
POTASSIUM: 3.7 mmol/L (ref 3.5–5.1)
SODIUM: 134 mmol/L — AB (ref 135–145)

## 2017-07-05 LAB — PHOSPHORUS: PHOSPHORUS: 5.4 mg/dL — AB (ref 2.5–4.6)

## 2017-07-05 LAB — MAGNESIUM: Magnesium: 1.7 mg/dL (ref 1.7–2.4)

## 2017-07-05 MED ORDER — MAGNESIUM SULFATE 2 GM/50ML IV SOLN
2.0000 g | Freq: Once | INTRAVENOUS | Status: AC
Start: 2017-07-05 — End: 2017-07-05
  Administered 2017-07-05: 2 g via INTRAVENOUS
  Filled 2017-07-05: qty 50

## 2017-07-05 MED ORDER — TRACE MINERALS CR-CU-MN-SE-ZN 10-1000-500-60 MCG/ML IV SOLN
INTRAVENOUS | Status: AC
  Administered 2017-07-05: 18:00:00 via INTRAVENOUS
  Filled 2017-07-05: qty 2644
  Filled 2017-07-05: qty 2000

## 2017-07-05 MED ORDER — POTASSIUM CHLORIDE 10 MEQ/50ML IV SOLN
10.0000 meq | INTRAVENOUS | Status: AC
  Administered 2017-07-05 (×4): 10 meq via INTRAVENOUS
  Filled 2017-07-05 (×4): qty 50

## 2017-07-05 MED ORDER — MAGNESIUM SULFATE 4 GM/100ML IV SOLN
4.0000 g | Freq: Once | INTRAVENOUS | Status: AC
  Administered 2017-07-05: 4 g via INTRAVENOUS
  Filled 2017-07-05: qty 100

## 2017-07-05 NOTE — Progress Notes (Signed)
  General Surgery [facility]  Assessment & Plan: Status post ex lap, small bowel resection, open abd wound             TNA, sips clear liquids             tolerating NG tube removed             Awaiting reconstructive abdominal surgery later this month - Dr. Hassell Done to discuss this week while LDOW        Earnstine Regal, MD, [facility]       Office: [phone]    Chief Complaint: Open abdomen  Subjective: Patient up to bedside commode.  Comfortable.  No nausea or emesis with NG tube removed.  Objective: Vital signs in last 24 hours: Temp:  [96.5 F (35.8 C)-98.1 F (36.7 C)] 97.5 F (36.4 C) (09/09 0429) Pulse Rate:  [25-114] 37 (09/09 0455) Resp:  [18-24] 21 (09/09 0531) BP: (88-116)/(60-80) 105/60 (09/09 0341) SpO2:  [95 %-100 %] 98 % (09/09 0455) Last BM Date: 06/27/17  Intake/Output from previous day: 09/08 0701 - 09/09 0700 In: 3809.6 [I.V.:3639.6; IV Piggyback:150] Out: 2325 [Urine:1700; Drains:625] Intake/Output this shift: No intake/output data recorded.  Physical Exam: HEENT - sclerae clear, mucous membranes moist Abdomen - VAC intact; drains with thin green output Ext - no edema, non-tender Neuro - alert & oriented, no focal deficits  Lab Results:   Recent Labs  07/03/17 0318  WBC 11.7*  HGB 10.8*  HCT 33.6*  PLT 593*   BMET  Recent Labs  07/04/17 0323 07/05/17 0337  NA 134* 134*  K 3.8 3.7  CL 105 106  CO2 19* 20*  GLUCOSE 99 122*  BUN 39* 38*  CREATININE 0.84 0.83  CALCIUM 9.6 9.2   PT/INR No results for input(s): LABPROT, INR in the last 72 hours. Comprehensive Metabolic Panel:    Component Value Date/Time   NA 134 (L) 07/05/2017 0337   NA 134 (L) 07/04/2017 0323   K 3.7 07/05/2017 0337   K 3.8 07/04/2017 0323   CL 106 07/05/2017 0337   CL 105 07/04/2017 0323   CO2 20 (L) 07/05/2017 0337   CO2 19 (L) 07/04/2017 0323   BUN 38 (H) 07/05/2017 0337   BUN 39 (H) 07/04/2017  0323   CREATININE 0.83 07/05/2017 0337   CREATININE 0.84 07/04/2017 0323   CREATININE 0.85 07/22/2013 1021   GLUCOSE 122 (H) 07/05/2017 0337   GLUCOSE 99 07/04/2017 0323   CALCIUM 9.2 07/05/2017 0337   CALCIUM 9.6 07/04/2017 0323   AST 60 (H) 07/02/2017 0617   AST 25 06/29/2017 0434   ALT 82 (H) 07/02/2017 0617   ALT 27 06/29/2017 0434   ALKPHOS 240 (H) 07/02/2017 0617   ALKPHOS 196 (H) 06/29/2017 0434   BILITOT 1.3 (H) 07/02/2017 0617   BILITOT 1.1 06/29/2017 0434   PROT 8.2 (H) 07/02/2017 0617   PROT 7.9 06/29/2017 0434   ALBUMIN 3.2 (L) 07/02/2017 0617   ALBUMIN 3.1 (L) 06/29/2017 0434    Studies/Results: No results found.    [name] [name] M 07/05/2017  Patient ID: [name] [name], male   DOB: [birth year], 56 y.o.   MRN: [id number]

## 2017-07-05 NOTE — Progress Notes (Signed)
Physical Therapy Treatment Patient Details Name: [name] [name] MRN: [id number] DOB: Jan 02, 1961 Today's Date: 07/05/2017    History of Present Illness  56 y.o. 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 On 7/18, he developed a leak therefore was taken back to OR for re-do of ex-lap and repair of leak on 05/20/17.  He returned to the ICU on the vent . Extubated on 05/28/17.     PT Comments    Assisted pt OOB to amb a great distance then positioned in High Back Semi recliner WC for RN who plans to take pt outside for a little with spouse.    Follow Up Recommendations  SNF;Supervision/Assistance - 24 hour     Equipment Recommendations  Rolling walker with 5" wheels    Recommendations for Other Services       Precautions / Restrictions Precautions Precautions: Fall Precaution Comments: multiple lines in abdomen, VAC, , and JP drain Restrictions Weight Bearing Restrictions: No    Mobility  Bed Mobility Overal bed mobility: Needs Assistance       Supine to sit: Min assist     General bed mobility comments: min assist for upper body due to ABD "discomfort"  Transfers Overall transfer level: Needs assistance Equipment used: Rolling walker (2 wheeled) Transfers: Sit to/from Stand Sit to Stand: Supervision;Min guard Stand pivot transfers: Supervision;Min guard       General transfer comment: assist for lines/leads/drains and one VC to reach back prior to sit  Ambulation/Gait Ambulation/Gait assistance: Min guard;+2 safety/equipment Ambulation Distance (Feet): 325 Feet Assistive device: Rolling walker (2 wheeled) Gait Pattern/deviations: Step-through pattern;Decreased step length - right;Decreased step length - left;Shuffle;Trunk flexed Gait velocity: WFL   General Gait Details: tolerated  an increased amb distance.  Highest HR was 127.     Stairs            Wheelchair Mobility    Modified Rankin (Stroke Patients Only)       Balance                                            Cognition Arousal/Alertness: Awake/alert Behavior During Therapy: WFL for tasks assessed/performed Overall Cognitive Status: Within Functional Limits for tasks assessed                                        Exercises      General Comments        Pertinent Vitals/Pain Pain Assessment: Faces Faces Pain Scale: Hurts a little bit Pain Location: ABD after activity Pain Descriptors / Indicators: Grimacing Pain Intervention(s): Monitored during session;Repositioned    Home Living                      Prior Function            PT Goals (current goals can now be found in the care plan section) Progress towards PT goals: Progressing toward goals    Frequency    Min 3X/week      PT Plan Current plan remains appropriate    Co-evaluation  AM-PAC PT "6 Clicks" Daily Activity  Outcome Measure  Difficulty turning over in bed (including adjusting bedclothes, sheets and blankets)?: A Lot Difficulty moving from lying on back to sitting on the side of the bed? : A Lot Difficulty sitting down on and standing up from a chair with arms (e.g., wheelchair, bedside commode, etc,.)?: A Lot Help needed moving to and from a bed to chair (including a wheelchair)?: A Lot Help needed walking in hospital room?: A Lot Help needed climbing 3-5 steps with a railing? : A Lot 6 Click Score: 12    End of Session Equipment Utilized During Treatment: Gait belt Activity Tolerance: Patient tolerated treatment well Patient left: in chair;with call bell/phone within reach;with family/visitor present Nurse Communication:  (placed pt in High Back Semi recline WC for RN who is taking pt outside for a little while) PT Visit Diagnosis:  Difficulty in walking, not elsewhere classified (R26.2);Muscle weakness (generalized) (M62.81)     Time: 3220-2542 PT Time Calculation (min) (ACUTE ONLY): 27 min  Charges:  $Gait Training: 8-22 mins $Therapeutic Activity: 8-22 mins                    G Codes:       [name]  PTA [facility]  Acute  Rehab Pager      [phone]

## 2017-07-05 NOTE — Progress Notes (Signed)
PHARMACY - ADULT TOTAL PARENTERAL NUTRITION CONSULT NOTE   Pharmacy Consult for TPN Indication: prolonged ileus, small bowel leak, multiple bowel surgeries  Patient Measurements: Body mass index is 25.21 kg/m. Filed Weights   07/02/17 0500 07/03/17 0450 07/04/17 0414  Weight: 172 lb 9.9 oz (78.3 kg) 173 lb 11.6 oz (78.8 kg) 175 lb 11.3 oz (79.7 kg)   HPI: 56 yoM admitted on 7/11 for enlarging adrenal mass and planned adrenalectomy.  Pharmacy consulted to dose TPN.  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   7/22 OR:  wash out, tube ileostomy, left with open abdomen & wound vac in place.  Plan for OR on 7/25 for closure 7/25 OR: mesh placed for abdominal closure, wound vac 8/2 extubated, propofol drip off 8/4 TPN off for ~ 5 hours due to line detached from filter 8/10 MD request decrease rate x 1 week 2nd low sodium 8/12 Additional sodium added to TPN due to persistent hyponatremia 8/16 Increasing rate back to goal since sodium has improved with above intervention.   8/20 Prealbumin decreased.  New apparent enteric leak per surgery notes. 8/21 CaPhos product elevated, remove electrolytes from TPN 8/25 PICC did not have blood return in 2 of the 3 ports.  Alteplase intra-catheter and function returned.  TPN infusing correctly on 8/26 per RN. 8/28: d/c SSI 8/30: added electrolytes to TPN 8/31: TPN IV line came out around ~0340 and current bag was stopped at this time.  Restart new clinimix 5/15 1L bag (no lytes since phos is elevated this morning) until next bag is due at [facility]. Will not add MVI and trace elements to this 1L  bag. Use electrolyte free clinimix with new bag at [facility] 9/1 NGT out 9/3 added electrolytes to TPN 9/4 lytes out of TPN 9/7 cycle over 18 hrs 9/8 add back electrolytes to TPN and cycle over 14 hours 9/9 lytes out of TPN, continue 14 hr cycle, DC SSI/CBGs  Insulin requirements past 24 hours: 2 units SSI (no hx DM)  Current Nutrition: NPO x ice chips 9/5  IVF: none. Lasix 20 IV qday  Central access: PICC line ordered 7/19, placed on 7/20 TPN start date: 7/20  ASSESSMENT                                                                                                           Today, 07/05/17  - Glucose (goal <150): at goal, CBG 122-135-147 while cycle infusing at 212 ml/hr - acceptable, will DC SSI/CBGs -  Electrolytes: K 3.7 (goal > = 4 for ileus), Mag 1.7- after 2 gm yesterday ( goal >-2 for ileus), Na134   phos 3.5> 5.4 after lytes added back to TPN 9/8 , CorrCa 9.84 , Ca/phos product = 53  goal is < 55- electrolytes in TPN 8/9 PM- take out 9/9 - Renal:  SCr wnl,  BUN elevated at 38 but trending down  - LFTs: slightly elevated- AST 60, ALT 82, Alk Phos 240, T Bili 1.3 - TGs:   206 (9/3) - Prealbumin:  <5 (7/20), 6.1 (7/24), 10.1 (7/30) 24.8 (8/6), 26.4 (8/13), 15.9 (8/20), 19.7 (8/27) 24.7 (9/3)- within normal range -  9/4 pall care dc dilaudid PCA>> fent patch, dc thorazine, continued reglan 10 IV q6h, PPI IV BID and pepcid 40 in TPN Started scheduled zofran, zyprexa, prn ativan, scheduled IV diazepam, rec trial of octreotide - 9/4 RN unable to get NGT to insert -  9/6 Nausea much improved after meds adjusted NUTRITIONAL GOALS                                                                                             RD recs (9/3):  122-138 gms Protein (1.5 - 1.7 g/kg) 2030 - 2270 Kcal  Recalculation: Clinimix 5/15 at rate of 115 ml/hr with lipids 3x/week on MWF only would provide: 138g protein and avg 2164 kcal/day  - Glucose infusion rate will be 3.53 mg/kg/min (Maximum 5  mg/kg/min)  PLAN      Now:  Replete electrolytes to maintain WNL  Magnesium 6 gm  IV x 1 followed by 4 runs K  At 1800 today: ? Take out electrolytes: change to Clinimix 5/15 - due to elevated calcium phos product when lytes added to TPN   Add sodium to TPN  (add 125 mEq of Na/L in a 1:57 YO:[id number]  ratio) for hyponatremia ? Continue 14 hr cycle  ? This will provide 132 gm protein and 2080 Kcal meeting 100% of needs ? Lipids at 13ms/hr x 12 hours 3 times weekly on MWF  MVI/TE in TPN and pepcid 40 mg/day in TPN  TPN lab panels on Mondays & Thursdays.  BMET AM, Mag & Phos daily   MEudelia Bunch Pharm.D. [id number]/06/2017 7:17 AM

## 2017-07-06 LAB — DIFFERENTIAL
BASOS PCT: 0 %
Basophils Absolute: 0 10*3/uL (ref 0.0–0.1)
Eosinophils Absolute: 0.4 10*3/uL (ref 0.0–0.7)
Eosinophils Relative: 3 %
LYMPHS PCT: 31 %
Lymphs Abs: 4.5 10*3/uL — ABNORMAL HIGH (ref 0.7–4.0)
Monocytes Absolute: 1.1 10*3/uL — ABNORMAL HIGH (ref 0.1–1.0)
Monocytes Relative: 8 %
NEUTROS PCT: 58 %
Neutro Abs: 8.3 10*3/uL — ABNORMAL HIGH (ref 1.7–7.7)

## 2017-07-06 LAB — COMPREHENSIVE METABOLIC PANEL
ALT: 55 U/L (ref 17–63)
AST: 32 U/L (ref 15–41)
Albumin: 2.7 g/dL — ABNORMAL LOW (ref 3.5–5.0)
Alkaline Phosphatase: 199 U/L — ABNORMAL HIGH (ref 38–126)
Anion gap: 10 (ref 5–15)
BUN: 32 mg/dL — AB (ref 6–20)
CHLORIDE: 103 mmol/L (ref 101–111)
CO2: 24 mmol/L (ref 22–32)
Calcium: 9.3 mg/dL (ref 8.9–10.3)
Creatinine, Ser: 0.79 mg/dL (ref 0.61–1.24)
GFR calc Af Amer: >60 mL/min (ref >60)
GFR calc non Af Amer: >60 mL/min (ref >60)
Glucose, Bld: 99 mg/dL (ref 65–99)
Potassium: 4 mmol/L (ref 3.5–5.1)
SODIUM: 137 mmol/L (ref 135–145)
Total Bilirubin: 1 mg/dL (ref 0.3–1.2)
Total Protein: 7.1 g/dL (ref 6.5–8.1)

## 2017-07-06 LAB — CBC
HEMATOCRIT: 30.4 % — AB (ref 39.0–52.0)
Hemoglobin: 9.7 g/dL — ABNORMAL LOW (ref 13.0–17.0)
MCH: 25.3 pg — ABNORMAL LOW (ref 26.0–34.0)
MCHC: 31.9 g/dL (ref 30.0–36.0)
MCV: 79.2 fL (ref 78.0–100.0)
Platelets: 462 10*3/uL — ABNORMAL HIGH (ref 150–400)
RBC: 3.84 MIL/uL — ABNORMAL LOW (ref 4.22–5.81)
RDW: 17.4 % — AB (ref 11.5–15.5)
WBC: 14.4 10*3/uL — ABNORMAL HIGH (ref 4.0–10.5)

## 2017-07-06 LAB — PHOSPHORUS: PHOSPHORUS: 4.9 mg/dL — AB (ref 2.5–4.6)

## 2017-07-06 LAB — TRIGLYCERIDES: TRIGLYCERIDES: 227 mg/dL — AB (ref <150)

## 2017-07-06 LAB — MAGNESIUM: MAGNESIUM: 1.5 mg/dL — AB (ref 1.7–2.4)

## 2017-07-06 LAB — PREALBUMIN: Prealbumin: 23.7 mg/dL (ref 18–38)

## 2017-07-06 MED ORDER — BACITRACIN-NEOMYCIN-POLYMYXIN OINTMENT TUBE
TOPICAL_OINTMENT | CUTANEOUS | Status: DC | PRN
  Administered 2017-07-23 – 2017-07-26 (×3): via TOPICAL
  Filled 2017-07-06 (×2): qty 14.17

## 2017-07-06 MED ORDER — FAT EMULSION 20 % IV EMUL
240.0000 mL | INTRAVENOUS | Status: AC
  Administered 2017-07-06: 240 mL via INTRAVENOUS
  Filled 2017-07-06: qty 250

## 2017-07-06 MED ORDER — TRACE MINERALS CR-CU-MN-SE-ZN 10-1000-500-60 MCG/ML IV SOLN
INTRAVENOUS | Status: AC
  Administered 2017-07-06: 17:00:00 via INTRAVENOUS
  Filled 2017-07-06: qty 2644
  Filled 2017-07-06: qty 2000

## 2017-07-06 MED ORDER — ALTEPLASE 2 MG IJ SOLR
2.0000 mg | Freq: Once | INTRAMUSCULAR | Status: AC
  Administered 2017-07-06: 2 mg
  Filled 2017-07-06 (×2): qty 2

## 2017-07-06 MED ORDER — MAGNESIUM SULFATE 4 GM/100ML IV SOLN
4.0000 g | Freq: Four times a day (QID) | INTRAVENOUS | Status: AC
  Administered 2017-07-06 (×2): 4 g via INTRAVENOUS
  Filled 2017-07-06 (×3): qty 100

## 2017-07-06 NOTE — Progress Notes (Signed)
Nutrition Follow-up  DOCUMENTATION CODES:   Not applicable  INTERVENTION:  - Continue cyclic TPN per Pharmacy. - Will monitor for ability to consume items PO.   NUTRITION DIAGNOSIS:   Inadequate oral intake related to inability to eat as evidenced by NPO status. -ongoing  GOAL:   Patient will meet greater than or equal to 90% of their needs -met with TPN.   MONITOR:   Diet advancement, Weight trends, Labs, Skin, I & O's, Other (Comment) (TPN regimen)  ASSESSMENT:   56 y.o. 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 On 7/18, he developed a leak therefore was taken back to OR for re-do of ex-lap and repair of leak.  Significant events: 7/11 QI:HKVQQ adrenalectomy with complication of intestinal injury and small bowel resection with anastomosis  7/18 VZ:DGLOVF of small bowel anastomotic leak, abdominal closure of wound dehisence 7/19 OR: reopening of recent laparotomy, lysis of adhesions, noted ischemic perforation of new loop of intestine, intact repair of previous anastomotic leak repair, intact previous anastomosis.  7/22 OR: wash out, tube ileostomy, left with open abdomen &wound vac in place. Plan for OR on 7/25 for closure 7/25 OR: mesh placed for abdominal closure, wound vac 8/2extubated, propofol drip off 8/4TPN off for ~ 5 hours due to line detached from filter. Ultrasound-guided aspiration By interventional radiology of small perihepatic fluid collection yielded only 1 mL of the fluid, sent for culture. NPO - mild coffee ground on NG but no active bleed 8/6patient improving. Off Precedex drip since 8/4. Dilaudid Drip being changed over to PCA. Still with some coffee ground-looking material in NG tube.  8/10 hyponatremia and plan to decrease TPN from 3L/day to 2L/day x1  week 8/77fund to have fecal matter in wound vac (EC fistula) 8/14increase in fecal matter presence in wound vac with no plan for surgery at this time 8/16TPN advanced to goal rate of 115 ml/hr with 20% ILE @ 20 mL/hr x12 hours on MWF 8/22: NGT clamped. RD to order Boost Breeze for patient to sip on; Mycostatin started for oral thrush. 9/1:pulled NGT and no plans for replacement. 9/1:developed N/V and only permitted sips of clears, if tolerated. 9/4: NGT re-insertion attempted several times without success 9/7: begin cycling TPN   9/10 Pt remains NPO with allowance for popsicles and ice chips as tolerated. NGT remains out since 9/1. Pt with triple lumen PICC and receiving cyclic TPN: Clinimix 56/43@ 50 mL/hr x1 hour, 212 mL/hr x12 hours, 50 mL/hr x1 hour (2644 mL/day) with 20% ILE @ 20 mL/hr x12 hours on MWF. This regimen is providing a daily average of 132 grams of protein and 2083 kcal; meeting 100% estimated protein and kcal need. Weight continues to fluctuate. Per Dr. GGala Lewandowskynote this AM, plan is still for reconstructive surgery at the end of the month and Dr. MHassell Doneto discuss this during this week. Pt remains free of N/V.   Medications reviewed; 20 mg IV Lasix/day, 2 g IV Mg sulfate x1 run yesterday, 4 g IV Mg sulfate x1 run yesterday, 10 mg IV Reglan TID, 4 mg IV Zofran TID, 40 mg IV Protonix BID, 250 mg Florastor BID.  Labs reviewed; CBGs: 135, 102, and 94 mg/dL on 9/9, Na: 134 mmol/L, BUN: 38 mg/dL, Phos: 5.4 mg/dL.     9/7 - Pt discussed in rounds this AM.  -  Pharmacist reports plan to begin cycling TPN; see note today at 0707 for further details on this plan.  - Per this RD's note on 8/29, recommended Clinimix (E) 5/15 x16 hours (50 mL/hr x1 hour, 190 mL/hr x14 hours, 50 mL/hr x1 hour; total volume: 1007 mL)  - This cyclic regimen would provide a daily average (if ILE continued on to be provided MWF) of 2166 kcal and 138 grams of protein.  - Pharmacist states hope of being  able to cycle down to only 14 hours/day.     9/6 - Currently receiving electrolyte-free Clinimix 5/15 d/t persistent hyperphosphatemia. - Pt permitted sips of clear liquids.  - Noted that on 9/3 pt was experiencing persistent nausea with dry heaving.  - Reviewed notes from yesterday and today which indicate that pt was requesting a diet d/t hunger and that he experienced nausea today with water.  - Per Brooke's, Surgery PA, note this AM, plan to continue with sips of clears only.  - Talked with her on the phone to ask if sips of full liquids would be feasible. - Her note also states that "Dr. Hassell Done considering taking patient back to surgery later this month (2 months after prior surgery)."  - Weight fluctuating almost daily for the past 1 month.    Diet Order:  Diet NPO time specified Except for: Ice Chips, Other (See Comments) TPN (CLINIMIX) Adult without lytes  Skin:  Wound (see comment) (Incisions to R flank and abdomen from 7/11 and 7/18)  Last BM:  9/9  Height:   Ht Readings from Last 1 Encounters:  05/26/17 '5\' 10"'  (1.778 m)    Weight:   Wt Readings from Last 1 Encounters:  07/04/17 175 lb 11.3 oz (79.7 kg)    Ideal Body Weight:  75.45 kg  BMI:  Body mass index is 25.21 kg/m.  Estimated Nutritional Needs:   Kcal:  2030-2270 (25-28 kcal/kg)  Protein:  122-138 grams (1.5-1.7 grams/kg)  Fluid:  2 L/day  EDUCATION NEEDS:   No education needs identified at this time    Jarome Matin, MS, RD, LDN, CNSC Inpatient Clinical Dietitian Pager # [phone] After hours/weekend pager # [phone]

## 2017-07-06 NOTE — Progress Notes (Signed)
Physical Therapy Treatment Patient Details Name: [name] [name] MRN: [id number] DOB: July 31, 1961 Today's Date: 07/06/2017    History of Present Illness  56 y.o. 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 On 7/18, he developed a leak therefore was taken back to OR for re-do of ex-lap and repair of leak on 05/20/17.  He returned to the ICU on the vent . Extubated on 05/28/17.     PT Comments    Assisted OOB to amb a greater distance.  Follow Up Recommendations  SNF;Supervision/Assistance - 24 hour     Equipment Recommendations  Rolling walker with 5" wheels    Recommendations for Other Services       Precautions / Restrictions Precautions Precautions: Fall Precaution Comments: multiple lines in abdomen, VAC, , and JP drain Restrictions Weight Bearing Restrictions: No    Mobility  Bed Mobility Overal bed mobility: Needs Assistance Bed Mobility: Supine to Sit     Supine to sit: Min guard;Min assist     General bed mobility comments: min assist for upper body due to ABD "discomfort" and no assist B LE's off bed  Transfers Overall transfer level: Needs assistance Equipment used: Rolling walker (2 wheeled) Transfers: Sit to/from Stand Sit to Stand: Supervision;Min guard Stand pivot transfers: Supervision;Min guard       General transfer comment: assist for lines/leads/drains and one VC to reach back prior to sit  Ambulation/Gait Ambulation/Gait assistance: Min guard;+2 safety/equipment Ambulation Distance (Feet): 600 Feet Assistive device: Rolling walker (2 wheeled) Gait Pattern/deviations: Step-through pattern;Decreased step length - right;Decreased step length - left;Shuffle;Trunk flexed Gait velocity: WFL   General Gait Details: tolerated an increased amb distance.  Highest HR was  133.     Stairs            Wheelchair Mobility    Modified Rankin (Stroke Patients Only)       Balance                                            Cognition Arousal/Alertness: Awake/alert Behavior During Therapy: WFL for tasks assessed/performed Overall Cognitive Status: Within Functional Limits for tasks assessed                                        Exercises      General Comments        Pertinent Vitals/Pain Pain Assessment: Faces Faces Pain Scale: Hurts a little bit Pain Location: ABD after activity    Home Living                      Prior Function            PT Goals (current goals can now be found in the care plan section) Progress towards PT goals: Progressing toward goals    Frequency    Min 3X/week      PT Plan Current plan remains appropriate    Co-evaluation              AM-PAC PT "6 Clicks" Daily Activity  Outcome Measure  Difficulty turning over in bed (including adjusting bedclothes, sheets and blankets)?:  A Lot Difficulty moving from lying on back to sitting on the side of the bed? : A Lot Difficulty sitting down on and standing up from a chair with arms (e.g., wheelchair, bedside commode, etc,.)?: A Lot Help needed moving to and from a bed to chair (including a wheelchair)?: A Lot Help needed walking in hospital room?: A Lot Help needed climbing 3-5 steps with a railing? : A Lot 6 Click Score: 12    End of Session Equipment Utilized During Treatment: Gait belt Activity Tolerance: Patient tolerated treatment well Patient left: in chair;with call bell/phone within reach;with family/visitor present Nurse Communication: Mobility status PT Visit Diagnosis: Difficulty in walking, not elsewhere classified (R26.2);Muscle weakness (generalized) (M62.81)     Time: 3254-9826 PT Time Calculation (min) (ACUTE ONLY): 25 min  Charges:  $Gait Training: 8-22 mins $Therapeutic  Activity: 8-22 mins                    G Codes:       [name]  PTA [facility]  Acute  Rehab Pager      [phone]

## 2017-07-06 NOTE — Progress Notes (Signed)
PHARMACY - ADULT TOTAL PARENTERAL NUTRITION CONSULT NOTE   Pharmacy Consult for TPN Indication: prolonged ileus, small bowel leak, multiple bowel surgeries  Patient Measurements: Body mass index is 25.21 kg/m. Filed Weights   07/02/17 0500 07/03/17 0450 07/04/17 0414  Weight: 172 lb 9.9 oz (78.3 kg) 173 lb 11.6 oz (78.8 kg) 175 lb 11.3 oz (79.7 kg)   HPI: 33 yoM admitted on 7/11 for enlarging adrenal mass and planned adrenalectomy.  Pharmacy consulted to dose TPN.  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   7/22 OR:  wash out, tube ileostomy, left with open abdomen & wound vac in place.  Plan for OR on 7/25 for closure 7/25 OR: mesh placed for abdominal closure, wound vac 8/2 extubated, propofol drip off 8/4 TPN off for ~ 5 hours due to line detached from filter 8/10 MD request decrease rate x 1 week 2nd low sodium 8/12 Additional sodium added to TPN due to persistent hyponatremia 8/16 Increasing rate back to goal since sodium has improved with above intervention.   8/20 Prealbumin decreased.  New apparent enteric leak per surgery notes. 8/21 CaPhos product elevated, remove electrolytes from TPN 8/25 PICC did not have blood return in 2 of the 3 ports.  Alteplase intra-catheter and function returned.  TPN infusing correctly on 8/26 per RN. 8/28: d/c SSI 8/30: added electrolytes to TPN 8/31: TPN IV line came out around ~0340 and current bag was stopped at this time.  Restart new clinimix 5/15 1L bag (no lytes since phos is elevated this morning) until next bag is due at [facility]. Will not add MVI and trace elements to this 1L  bag. Use electrolyte free clinimix with new bag at [facility] 9/1 NGT out 9/3 added electrolytes to TPN 9/4 lytes out of TPN 9/7 cycle over 18 hrs 9/8 add back electrolytes to TPN and cycle over 14 hours 9/9 lytes out of TPN, continue 14 hr cycle, DC SSI/CBGs  Insulin requirements past 24 hours: 2 units SSI (no hx DM)  Current Nutrition: NPO x ice chips 9/5  IVF: none. Lasix 20 IV qday  Central access: PICC line ordered 7/19, placed on 7/20 TPN start date: 7/20  ASSESSMENT                                                                                                           Today, 07/06/17  - Glucose (goal <150): at goal -  Electrolytes: K 4.0 (goal > = 4 for ileus), Mag 1.5- after 6gm yesterday ( goal >-2 for ileus), Na 137,  Phos 4.9, CorrCa 10.34, Ca/phos product 50.7 =   goal is < 55 - Renal:  SCr wnl,  BUN elevated at 32  but trending down  - LFTs: WNL  except Alk Phos which is elevated at 199, but trending down.    - TGs:   206 (9/3), IP (9/10) - Prealbumin:  <5 (7/20), 6.1 (7/24), 10.1 (7/30) 24.8 (8/6), 26.4 (8/13), 15.9 (8/20), 19.7 (8/27) 24.7 (9/3) IP (9/10) -  9/4 pall care dc dilaudid PCA>> fent patch, dc thorazine, continued reglan 10 IV q6h, PPI IV BID and pepcid 40 in TPN Started scheduled zofran, zyprexa, prn ativan, scheduled IV diazepam, rec trial of octreotide - 9/4 RN unable to get NGT to insert -  9/6 Nausea much improved after meds adjusted  NUTRITIONAL GOALS                                                                                             RD recs (9/3):  122-138 gms Protein (1.5 - 1.7 g/kg) 2030 - 2270 Kcal  Recalculation: Clinimix 5/15 over 14h cycle with lipids 3x/week on MWF only would provide: 132g protein and avg 2080 kcal/day  - Glucose infusion rate will be 3.53 mg/kg/min (Maximum 5 mg/kg/min)  PLAN      Now:  Replete electrolytes to maintain WNL  Magnesium 4 gm  IV x 2 today  At 1800 today: ? Continue Clinimix 5/15 for total of  2669m/day to cycle over 14hrs   Add sodium to TPN  (add 125 mEq of Na/L in a 1:1 CIY:MEBRAXEratio) for hyponatremia ? Lipids at 279m/hr x 12 hours 3 times weekly on MWF  MVI/TE in TPN and pepcid 40 mg/day in TPN  TPN lab panels on Mondays & Thursdays.  BMET AM, Mag & Phos daily  MiNetta CedarsPharmD, BCPS Pager: [phone]/07/2017 7:08 AM

## 2017-07-06 NOTE — Consult Note (Addendum)
[place] Nurse wound consult note Surgical team following for assessment and plan of care.  Photo taken as requested; wife took the picture and has it on her phone for surgical team to review when they are next present.  Reason for Consult: NPWT dressing change,Wound type: post-op surgical full thickness Measurement:see Monday Wound bed: 80% red moist granulating tissue, 20% exposed mesh, with greenish brown tint.  Scant amt green drainage when patted with gauze.  Drainage (amount, consistency, odor) Small amt brown drainage in the cannister, strong fecal odor when dressing changed. Periwound: No maceration surrounding wound edges. Dressing procedure/placement/frequency: Patient medicated with IV pain medication. Removed drape with spray adhesive remover, removed one white foam and one black foam. Cleansed wound with NS, patted dry, skin prep applied to wound edges, and placed 1 piece of one white foam and 1 piece of black foam, drape applied, TRAC pad applied lower on the wound bed as requested and negative pressure at 144mmHg was started with immediate seal. New cannister applied. Patient tolerated dressing change well.  [place] team with continue to follow, remain available to this patient, nursing, and medical, surgical teams.  Julien Girt MSN, RN, [place], [place], [place]

## 2017-07-06 NOTE — Care Management Note (Signed)
Case Management Note  Patient Details  Name: [name] [name] MRN: [id number] Date of Birth: [birth year]  Subjective/Objective:                  Sips of cl. Liqs, iv tpn, awaiting surg for fecal material leakage at surgicla site later this month, wound vac to open abd wound  Action/Plan: Date:  July 06, 2017 Chart reviewed for concurrent status and case management needs. Will continue to follow patient progress. Discharge Planning: following for needs Expected discharge date: [id number] [name], BSN, [employer], [place]  Expected Discharge Date:                  Expected Discharge Plan:  Home/Self Care  In-House Referral:     Discharge planning Services  CM Consult  Post Acute Care Choice:    Choice offered to:     DME Arranged:    DME Agency:     HH Arranged:    HH Agency:     Status of Service:  In process, will continue to follow  If discussed at Long Length of Stay Meetings, dates discussed:    Additional Comments:  Leeroy Cha, RN 07/06/2017, 9:13 AM

## 2017-07-06 NOTE — Progress Notes (Signed)
Patient ID: [name] [name], male   DOB: Dec 24, 1960, 56 y.o.   MRN: [id number] [facility] Progress Note:   47 Days Post-Op  Subjective: Mental status is clear.   Objective: Vital signs in last 24 hours: Temp:  [98.1 F (36.7 C)-99.3 F (37.4 C)] 98.3 F (36.8 C) (09/10 0800) Pulse Rate:  [92-104] 99 (09/10 1000) Resp:  [13-26] 17 (09/10 1000) BP: (98-125)/(62-80) 125/73 (09/10 1000) SpO2:  [95 %-100 %] 99 % (09/10 1000)  Intake/Output from previous day: 09/09 0701 - 09/10 0700 In: 3025.6 [P.O.:90; I.V.:2635.6; IV Piggyback:300] Out: 2915 [Urine:2375; Drains:540] Intake/Output this shift: Total I/O In: 30 [I.V.:30] Out: -   Physical Exam: Work of breathing is normal.  Foleys checked and secured to the abdominal wall with NG holder.    Lab Results:  Results for orders placed or performed during the hospital encounter of 05/06/17 (from the past 48 hour(s))  Glucose, capillary     Status: None   Collection Time: 07/04/17  4:03 PM  Result Value Ref Range   Glucose-Capillary 92 65 - 99 mg/dL   Comment 1 Notify RN    Comment 2 Document in Chart   Glucose, capillary     Status: Abnormal   Collection Time: 07/04/17  8:19 PM  Result Value Ref Range   Glucose-Capillary 147 (H) 65 - 99 mg/dL   Comment 1 Notify RN    Comment 2 Document in Chart   Magnesium     Status: None   Collection Time: 07/05/17  3:37 AM  Result Value Ref Range   Magnesium 1.7 1.7 - 2.4 mg/dL  Phosphorus     Status: Abnormal   Collection Time: 07/05/17  3:37 AM  Result Value Ref Range   Phosphorus 5.4 (H) 2.5 - 4.6 mg/dL  Basic metabolic panel     Status: Abnormal   Collection Time: 07/05/17  3:37 AM  Result Value Ref Range   Sodium 134 (L) 135 - 145 mmol/L   Potassium 3.7 3.5 - 5.1 mmol/L   Chloride 106 101 - 111 mmol/L   CO2 20 (L) 22 - 32 mmol/L   Glucose, Bld 122 (H) 65 - 99 mg/dL   BUN 38 (H) 6 - 20 mg/dL   Creatinine, Ser 0.83 0.61 - 1.24 mg/dL   Calcium 9.2 8.9 - 10.3 mg/dL   GFR  calc non Af Amer >60 >60 mL/min   GFR calc Af Amer >60 >60 mL/min    Comment: (NOTE) The eGFR has been calculated using the CKD EPI equation. This calculation has not been validated in all clinical situations. eGFR's persistently <60 mL/min signify possible Chronic Kidney Disease.    Anion gap 8 5 - 15  Glucose, capillary     Status: Abnormal   Collection Time: 07/05/17  5:57 AM  Result Value Ref Range   Glucose-Capillary 135 (H) 65 - 99 mg/dL  Glucose, capillary     Status: Abnormal   Collection Time: 07/05/17 12:51 PM  Result Value Ref Range   Glucose-Capillary 102 (H) 65 - 99 mg/dL  Glucose, capillary     Status: None   Collection Time: 07/05/17  4:33 PM  Result Value Ref Range   Glucose-Capillary 94 65 - 99 mg/dL    Radiology/Results: No results found.  Anti-infectives: Anti-infectives    Start     Dose/Rate Route Frequency Ordered Stop   07/01/17 1500  fluconazole (DIFLUCAN) IVPB 100 mg     100 mg 50 mL/hr over 60 Minutes Intravenous Every  24 hours 07/01/17 1401 07/08/17 1459   06/16/17 1200  piperacillin-tazobactam (ZOSYN) IVPB 3.375 g     3.375 g 12.5 mL/hr over 240 Minutes Intravenous Every 8 hours 06/16/17 1026 06/17/17 0004   06/08/17 1200  piperacillin-tazobactam (ZOSYN) IVPB 3.375 g  Status:  Discontinued     3.375 g 12.5 mL/hr over 240 Minutes Intravenous Every 8 hours 06/08/17 1056 06/16/17 1026   05/29/17 2200  ceFEPIme (MAXIPIME) 2 g in dextrose 5 % 50 mL IVPB  Status:  Discontinued     2 g 100 mL/hr over 30 Minutes Intravenous Every 8 hours 05/29/17 2031 06/03/17 0832   05/29/17 2100  metroNIDAZOLE (FLAGYL) IVPB 500 mg  Status:  Discontinued     500 mg 100 mL/hr over 60 Minutes Intravenous Every 8 hours 05/29/17 2029 06/03/17 0832   05/26/17 2200  ceFAZolin (ANCEF) IVPB 1 g/50 mL premix  Status:  Discontinued     1 g 100 mL/hr over 30 Minutes Intravenous Every 8 hours 05/26/17 1008 05/29/17 2023   05/25/17 1100  vancomycin (VANCOCIN) 1,250 mg in sodium  chloride 0.9 % 250 mL IVPB  Status:  Discontinued     1,250 mg 166.7 mL/hr over 90 Minutes Intravenous Every 12 hours 05/25/17 0955 05/26/17 0946   05/22/17 1400  anidulafungin (ERAXIS) 100 mg in sodium chloride 0.9 % 100 mL IVPB     100 mg 78 mL/hr over 100 Minutes Intravenous Every 24 hours 05/21/17 1330 05/27/17 1740   05/21/17 1400  anidulafungin (ERAXIS) 200 mg in sodium chloride 0.9 % 200 mL IVPB     200 mg 78 mL/hr over 200 Minutes Intravenous  Once 05/21/17 1330 05/21/17 1940   05/15/17 1000  anidulafungin (ERAXIS) 100 mg in sodium chloride 0.9 % 100 mL IVPB  Status:  Discontinued     100 mg 78 mL/hr over 100 Minutes Intravenous Every 24 hours 05/14/17 0829 05/16/17 0901   05/15/17 1000  metroNIDAZOLE (FLAGYL) IVPB 500 mg  Status:  Discontinued     500 mg 100 mL/hr over 60 Minutes Intravenous Every 8 hours 05/15/17 0903 05/25/17 0938   05/14/17 0900  anidulafungin (ERAXIS) 200 mg in sodium chloride 0.9 % 200 mL IVPB     200 mg 78 mL/hr over 200 Minutes Intravenous  Once 05/14/17 0829 05/14/17 1325   05/13/17 1927  vancomycin (VANCOCIN) 1-5 GM/200ML-% IVPB    Comments:  Ward, Christa   : cabinet override      05/13/17 1927 05/14/17 0744   05/12/17 1400  ceFEPIme (MAXIPIME) 1 g in dextrose 5 % 50 mL IVPB     1 g 100 mL/hr over 30 Minutes Intravenous Every 8 hours 05/12/17 0939 05/26/17 2359   05/12/17 0900  vancomycin (VANCOCIN) IVPB 1000 mg/200 mL premix  Status:  Discontinued     1,000 mg 200 mL/hr over 60 Minutes Intravenous Every 12 hours 05/12/17 0750 05/16/17 0852   05/12/17 0830  aztreonam (AZACTAM) 2 GM IVPB     2 g 100 mL/hr over 30 Minutes Intravenous  Once 05/12/17 0800 05/12/17 0957   05/06/17 0916  vancomycin (VANCOCIN) IVPB 1000 mg/200 mL premix     1,000 mg 200 mL/hr over 60 Minutes Intravenous On call to O.R. 05/06/17 0916 05/06/17 1229      Assessment/Plan: Problem List: Patient Active Problem List   Diagnosis Date Noted  . Hypomagnesemia 06/28/2017  .  Hypothyroidism   . 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 Protein-calorie malnutrition, severe ([place]) 06/27/2017  . Pheochromocytoma, right,  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 06/27/2017  . Pressure injury of skin 05/29/2017  . Ileus ([place])   . Hypokalemia 05/14/2017  . Anastomotic leak of intestine 05/14/2017  . Wound dehiscence, surgical 05/14/2017  . Aspiration pneumonia ([place]) 05/14/2017  . Chronic narcotic use 05/10/2017  . Generalized anxiety disorder 05/10/2017  . Intra-abdominal adhesions s/p SB resection 05/06/2017 05/09/2017  . Throat symptom 03/18/2016  . Multinodular goiter 01/19/2016  . Hyperthyroidism 01/19/2016  . Essential hypertension   . Diarrhea 11/30/2014  . Anemia, iron deficiency 11/01/2014  . Leukocytosis 11/03/2013  . Tobacco abuse 07/26/2013  . COPD (chronic obstructive pulmonary disease) ([place]) 07/26/2013  . Left knee pain 07/26/2013  . Pain in joint, shoulder region 05/03/2013  . GERD 07/24/2008  . 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   47 Days Post-Op    LOS: 61 days   Matt B. Hassell Done, MD, [facility] [phone] beeper [phone]  07/06/2017 12:48 PM

## 2017-07-07 LAB — BASIC METABOLIC PANEL
Anion gap: 8 (ref 5–15)
BUN: 29 mg/dL — ABNORMAL HIGH (ref 6–20)
CHLORIDE: 103 mmol/L (ref 101–111)
CO2: 24 mmol/L (ref 22–32)
CREATININE: 0.75 mg/dL (ref 0.61–1.24)
Calcium: 8.5 mg/dL — ABNORMAL LOW (ref 8.9–10.3)
GFR calc Af Amer: >60 mL/min (ref >60)
GFR calc non Af Amer: >60 mL/min (ref >60)
Glucose, Bld: 137 mg/dL — ABNORMAL HIGH (ref 65–99)
Potassium: 4.2 mmol/L (ref 3.5–5.1)
SODIUM: 135 mmol/L (ref 135–145)

## 2017-07-07 LAB — MAGNESIUM: Magnesium: 2.5 mg/dL — ABNORMAL HIGH (ref 1.7–2.4)

## 2017-07-07 LAB — PHOSPHORUS: Phosphorus: 4.7 mg/dL — ABNORMAL HIGH (ref 2.5–4.6)

## 2017-07-07 MED ORDER — TRACE MINERALS CR-CU-MN-SE-ZN 10-1000-500-60 MCG/ML IV SOLN
INTRAVENOUS | Status: AC
  Administered 2017-07-07: 17:00:00 via INTRAVENOUS
  Filled 2017-07-07: qty 2644
  Filled 2017-07-07: qty 2000

## 2017-07-07 NOTE — Progress Notes (Signed)
Physical Therapy Treatment Patient Details Name: [name] [name] MRN: [id number] DOB: 07-Apr-1961 Today's Date: 07/07/2017    History of Present Illness  56 y.o. 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 On 7/18, he developed a leak therefore was taken back to OR for re-do of ex-lap and repair of leak on 05/20/17.  He returned to the ICU on the vent . Extubated on 05/28/17.     PT Comments    Assisted OOB to amb a greater distance.  Pt required + 2 assist for safety/multiple lines//tubes/drains/VAC and such recliner is following with close monitoring of vaitals(esp HR).   Follow Up Recommendations  SNF;Supervision/Assistance - 24 hour     Equipment Recommendations  Rolling walker with 5" wheels    Recommendations for Other Services       Precautions / Restrictions Precautions Precautions: Fall Precaution Comments: multiple lines in abdomen, VAC, , and JP drain Restrictions Weight Bearing Restrictions: No    Mobility  Bed Mobility Overal bed mobility: Needs Assistance Bed Mobility: Supine to Sit     Supine to sit: Min guard;Min assist     General bed mobility comments: min assist for upper body due to ABD "discomfort" and no assist B LE's off bed.  required extended sitting rest break EOB before amb   Transfers Overall transfer level: Needs assistance Equipment used: Rolling walker (2 wheeled) Transfers: Sit to/from Stand Sit to Stand: Supervision;Min guard Stand pivot transfers: Supervision;Min guard       General transfer comment: assist for lines/leads/drains and one VC to reach back prior to sit  Ambulation/Gait Ambulation/Gait assistance: Min guard;+2 safety/equipment Ambulation Distance (Feet): 650 Feet Assistive device: Rolling walker (2 wheeled) Gait Pattern/deviations:  Step-through pattern;Decreased step length - right;Decreased step length - left;Shuffle;Trunk flexed Gait velocity: WFL   General Gait Details: tolerated an increased amb distance.  Highest HR was 129.     Stairs            Wheelchair Mobility    Modified Rankin (Stroke Patients Only)       Balance                                            Cognition Arousal/Alertness: Awake/alert Behavior During Therapy: WFL for tasks assessed/performed Overall Cognitive Status: Within Functional Limits for tasks assessed                                 General Comments: pleasant/cooperative      Exercises      General Comments        Pertinent Vitals/Pain      Home Living                      Prior Function            PT Goals (current goals can now be found in the care plan section)      Frequency    Min 3X/week      PT Plan Current plan remains appropriate    Co-evaluation              AM-PAC PT "6 Clicks" Daily Activity  Outcome Measure  Difficulty turning over in bed (including adjusting bedclothes, sheets and blankets)?: A Lot   Difficulty sitting down on and standing up from a chair with arms (e.g., wheelchair, bedside commode, etc,.)?: A Lot Help needed moving to and from a bed to chair (including a wheelchair)?: A Lot Help needed walking in hospital room?: A Lot Help needed climbing 3-5 steps with a railing? : A Lot 6 Click Score: 10    End of Session Equipment Utilized During Treatment: Gait belt Activity Tolerance: Patient tolerated treatment well Patient left: in chair;with call bell/phone within reach;with family/visitor present Nurse Communication: Mobility status PT Visit Diagnosis: Difficulty in walking, not elsewhere classified (R26.2);Muscle weakness (generalized) (M62.81)     Time: 4627-0350 PT Time Calculation (min) (ACUTE ONLY): 26 min  Charges:  $Gait Training: 8-22  mins $Therapeutic Activity: 8-22 mins                    G Codes:       [name]  PTA [facility]  Acute  Rehab Pager      [phone]

## 2017-07-07 NOTE — Progress Notes (Signed)
PHARMACY - ADULT TOTAL PARENTERAL NUTRITION CONSULT NOTE   Pharmacy Consult for TPN Indication: prolonged ileus, small bowel leak, multiple bowel surgeries  Patient Measurements: Body mass index is 25.21 kg/m. Filed Weights   07/02/17 0500 07/03/17 0450 07/04/17 0414  Weight: 172 lb 9.9 oz (78.3 kg) 173 lb 11.6 oz (78.8 kg) 175 lb 11.3 oz (79.7 kg)   HPI: 16 yoM admitted on 7/11 for enlarging adrenal mass and planned adrenalectomy.  Pharmacy consulted to dose TPN.  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   7/22 OR:  wash out, tube ileostomy, left with open abdomen & wound vac in place.  Plan for OR on 7/25 for closure 7/25 OR: mesh placed for abdominal closure, wound vac 8/2 extubated, propofol drip off 8/4 TPN off for ~ 5 hours due to line detached from filter 8/10 MD request decrease rate x 1 week 2nd low sodium 8/12 Additional sodium added to TPN due to persistent hyponatremia 8/16 Increasing rate back to goal since sodium has improved with above intervention.   8/20 Prealbumin decreased.  New apparent enteric leak per surgery notes. 8/21 CaPhos product elevated, remove electrolytes from TPN 8/25 PICC did not have blood return in 2 of the 3 ports.  Alteplase intra-catheter and function returned.  TPN infusing correctly on 8/26 per RN. 8/28: d/c SSI 8/30: added electrolytes to TPN 8/31: TPN IV line came out around ~0340 and current bag was stopped at this time.  Restart new clinimix 5/15 1L bag (no lytes since phos is elevated this morning) until next bag is due at [facility]. Will not add MVI and trace elements to this 1L  bag. Use electrolyte free clinimix with new bag at [facility] 9/1 NGT out 9/3 added electrolytes to TPN 9/4 lytes out of TPN 9/7 cycle over 18 hrs 9/8 add back electrolytes to TPN and cycle over 14 hours 9/9 lytes out of TPN, continue 14 hr cycle, DC SSI/CBGs  Insulin requirements past 24 hours: none (no hx DM)  Current Nutrition: NPO x ice chips, popsicles 9/5  IVF: none. Lasix 20 IV qday  Central access: PICC line ordered 7/19, placed on 7/20 TPN start date: 7/20  ASSESSMENT                                                                                                           Today, 07/07/17  - Glucose (goal <150): at goal -  Electrolytes: K 4.2 (goal > = 4 for ileus), Mag 2.5- after 8gm yesterday ( goal >-2 for ileus), Na 135,  Phos 4.7, CorrCa 9.54, Ca/phos product 44.8 =   goal is < 55 - Renal:  SCr wnl,  BUN elevated at 29  but trending down  - LFTs (9/10): WNL  except Alk Phos which is elevated at 199, but trending down.    - TGs:   206 (9/3), 227 (9/10) - Prealbumin:  <5 (7/20), 6.1 (7/24), 10.1 (7/30) 24.8 (8/6), 26.4 (8/13), 15.9 (8/20), 19.7 (8/27) 24.7 (9/3) 23.7 (9/10) -  9/4 pall care dc dilaudid PCA>> fent patch, dc thorazine, continued reglan 10 IV q6h, PPI IV BID and pepcid 40 in TPN Started scheduled zofran, zyprexa, prn ativan, scheduled IV diazepam, rec trial of octreotide - 9/4 RN unable to get NGT to insert - 9/6 Nausea much improved after meds adjusted  NUTRITIONAL GOALS                                                                                             RD recs (9/3):  122-138 gms Protein (1.5 - 1.7 g/kg) 2030 - 2270 Kcal  Recalculation: Clinimix 5/15 over 14h cycle with lipids 3x/week on MWF only would provide: 132g protein and avg 2080 kcal/day  - Average glucose infusion rate will be 4.11 mg/kg/min (Maximum 5 mg/kg/min)  PLAN       At 1800 today: ? Continue Clinimix 5/15 for total of 2652m/day to cycle over 14hrs   Add sodium to TPN  (add 125 mEq  of Na/L in a 1:1 CHB:ZJIRCVEratio) for hyponatremia ? Lipids at 229m/hr x 12 hours 3 times weekly on MWF  MVI/TE in TPN and pepcid 40 mg/day in TPN  TPN lab panels on Mondays & Thursdays.  BMET AM, Mag & Phos daily  ErPeggyann JubaPharmD, BCPS Pager: [phone]/08/2017 7:32 AM

## 2017-07-07 NOTE — Care Management Note (Signed)
Case Management Note  Patient Details  Name: [name] [name] MRN: [id number] Date of Birth: September 11, 1961  Subjective/Objective:                  Cl.ligs.,iv tpn, wound vac,  Action/Plan: Date:  July 07, 2017 Chart reviewed for concurrent status and case management needs. Will continue to follow patient progress. Discharge Planning: following for needs Expected discharge date: [id number] [name], BSN, [place], [place]  Expected Discharge Date:                  Expected Discharge Plan:  Home/Self Care  In-House Referral:     Discharge planning Services  CM Consult  Post Acute Care Choice:    Choice offered to:     DME Arranged:    DME Agency:     HH Arranged:    HH Agency:     Status of Service:  In process, will continue to follow  If discussed at Long Length of Stay Meetings, dates discussed:    Additional Comments:  Leeroy Cha, RN 07/07/2017, 8:37 AM

## 2017-07-08 LAB — CBC
HEMATOCRIT: 28.2 % — AB (ref 39.0–52.0)
Hemoglobin: 9 g/dL — ABNORMAL LOW (ref 13.0–17.0)
MCH: 25.1 pg — ABNORMAL LOW (ref 26.0–34.0)
MCHC: 31.9 g/dL (ref 30.0–36.0)
MCV: 78.6 fL (ref 78.0–100.0)
PLATELETS: 401 10*3/uL — AB (ref 150–400)
RBC: 3.59 MIL/uL — AB (ref 4.22–5.81)
RDW: 17.1 % — AB (ref 11.5–15.5)
WBC: 9.7 10*3/uL (ref 4.0–10.5)

## 2017-07-08 LAB — BASIC METABOLIC PANEL
Anion gap: 8 (ref 5–15)
BUN: 29 mg/dL — ABNORMAL HIGH (ref 6–20)
CO2: 24 mmol/L (ref 22–32)
Calcium: 9 mg/dL (ref 8.9–10.3)
Chloride: 102 mmol/L (ref 101–111)
Creatinine, Ser: 0.67 mg/dL (ref 0.61–1.24)
GFR calc Af Amer: >60 mL/min (ref >60)
GFR calc non Af Amer: >60 mL/min (ref >60)
Glucose, Bld: 130 mg/dL — ABNORMAL HIGH (ref 65–99)
Potassium: 3 mmol/L — ABNORMAL LOW (ref 3.5–5.1)
Sodium: 134 mmol/L — ABNORMAL LOW (ref 135–145)

## 2017-07-08 LAB — MAGNESIUM: Magnesium: 1.3 mg/dL — ABNORMAL LOW (ref 1.7–2.4)

## 2017-07-08 LAB — PHOSPHORUS: Phosphorus: 4.6 mg/dL (ref 2.5–4.6)

## 2017-07-08 MED ORDER — MAGNESIUM SULFATE 4 GM/100ML IV SOLN
4.0000 g | Freq: Once | INTRAVENOUS | Status: AC
  Administered 2017-07-08: 4 g via INTRAVENOUS
  Filled 2017-07-08: qty 100

## 2017-07-08 MED ORDER — FLUCONAZOLE IN SODIUM CHLORIDE 100-0.9 MG/50ML-% IV SOLN
100.0000 mg | INTRAVENOUS | Status: AC
  Administered 2017-07-08 – 2017-07-14 (×7): 100 mg via INTRAVENOUS
  Filled 2017-07-08 (×11): qty 50

## 2017-07-08 MED ORDER — POTASSIUM CHLORIDE 10 MEQ/100ML IV SOLN
10.0000 meq | INTRAVENOUS | Status: DC
  Administered 2017-07-08: 10 meq via INTRAVENOUS
  Filled 2017-07-08: qty 100

## 2017-07-08 MED ORDER — TRACE MINERALS CR-CU-MN-SE-ZN 10-1000-500-60 MCG/ML IV SOLN
INTRAVENOUS | Status: AC
  Administered 2017-07-08: 18:00:00 via INTRAVENOUS
  Filled 2017-07-08: qty 2000
  Filled 2017-07-08: qty 2644

## 2017-07-08 MED ORDER — POTASSIUM CHLORIDE 10 MEQ/50ML IV SOLN
10.0000 meq | INTRAVENOUS | Status: AC
  Administered 2017-07-08 (×5): 10 meq via INTRAVENOUS
  Filled 2017-07-08 (×5): qty 50

## 2017-07-08 MED ORDER — FAT EMULSION 20 % IV EMUL
240.0000 mL | INTRAVENOUS | Status: AC
  Administered 2017-07-08: 240 mL via INTRAVENOUS
  Filled 2017-07-08: qty 250

## 2017-07-08 MED ORDER — BISACODYL 10 MG RE SUPP
10.0000 mg | Freq: Every day | RECTAL | Status: DC | PRN
  Administered 2017-07-10: 10 mg via RECTAL
  Filled 2017-07-08: qty 1

## 2017-07-08 NOTE — Progress Notes (Signed)
Occupational Therapy Treatment and goal update Patient Details Name: [name] [name] MRN: [id number] DOB: [birth year] Today's Date: 07/08/2017    History of present illness  56 y.o. 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 On 7/18, he developed a leak therefore was taken back to OR for re-do of ex-lap and repair of leak on 05/20/17.  He returned to the ICU on the vent . Extubated on 05/28/17.    OT comments  Pt did not feel like doing ADL activities this session. He worked on sitting and standing tolerance.  HR 101-125.  Pt verbalizes the progress he has been making. His goal is to go outside  Follow Up Recommendations  SNF    Equipment Recommendations  3 in 1 bedside commode    Recommendations for Other Services      Precautions / Restrictions Precautions Precautions: Fall Precaution Comments: multiple lines in abdomen, VAC, , and JP drain Restrictions Weight Bearing Restrictions: No       Mobility Bed Mobility Overal bed mobility: Needs Assistance Bed Mobility: Supine to Sit Rolling: Min guard Sidelying to sit: Min guard Supine to sit: Min guard;Min assist Sit to supine: Min guard;HOB elevated   General bed mobility comments: pt reported pain with sidelying to sit.  Raised HOB for back to bed  Transfers Overall transfer level: Needs assistance Equipment used: Rolling walker (2 wheeled) Transfers: Sit to/from Stand Sit to Stand: Supervision        General transfer comment: watched lines, cues for UE placement.  Also sidestepped up [place]                                           ADL either performed or assessed with clinical judgement   ADL                                         General ADL Comments: pt did not want stand for adls but  agreeable to standing statically as well as sitting EOB and performing exercises.  Pt stood x 3 for 2 minutes twice and 30 seconds the last time.  He reports he has been doing exercises, and encouraged him to perform more reps. Pt sat unsupported  EOB 10 minutes.       Vision       Perception     Praxis      Cognition Arousal/Alertness: Awake/alert Behavior During Therapy: WFL for tasks assessed/performed Overall Cognitive Status: Within Functional Limits for tasks assessed                                 General Comments: pleasant/cooperative        Exercises Other Exercises Other Exercises: 20 FF and abd exercises   Shoulder Instructions       General Comments Discussed goals and updated today.  Pt is fine with wife helping with adls.  He just really wants to increase his activity tolerance.  His goal is to be outside    Pertinent Vitals/ Pain  Pain Assessment: No/denies pain Pain Score: 7  Pain Location: ABD after activity Pain Descriptors / Indicators: Grimacing Pain Intervention(s): Limited activity within patient's tolerance;Monitored during session;Premedicated before session;Repositioned;Patient requesting pain meds-RN notified  Home Living                                          Prior Functioning/Environment              Frequency           Progress Toward Goals  OT Goals(current goals can now be found in the care plan section)  Progress towards OT goals: Progressing toward goals  Acute Rehab OT Goals Time For Goal Achievement: 07/22/17 Potential to Achieve Goals: Good ADL Goals Pt Will Perform Grooming:  (discontinue) Pt Will Transfer to Toilet: with supervision;ambulating;bedside commode Pt Will Perform Toileting - Clothing Manipulation and hygiene: with min assist;sit to/from stand Additional ADL Goal #2: pt will tolerate sitting eob x 20 minutes with any activity of choice to increase endurance for  adls Additional ADL Goal #3: pt will stand for 4 minutes statically for adls  Plan      Co-evaluation                 AM-PAC PT "6 Clicks" Daily Activity     Outcome Measure   Help from another person eating meals?: A Little Help from another person taking care of personal grooming?: A Little Help from another person toileting, which includes using toliet, bedpan, or urinal?: A Lot Help from another person bathing (including washing, rinsing, drying)?: A Lot Help from another person to put on and taking off regular upper body clothing?: A Little Help from another person to put on and taking off regular lower body clothing?: A Lot 6 Click Score: 15    End of Session        Activity Tolerance     Patient Left     Nurse Communication          Time: 1535-1600 OT Time Calculation (min): 25 min  Charges: OT General Charges $OT Visit: 1 Visit OT Treatments $Therapeutic Activity: 23-37 mins  [name], OTR/L [phone] 07/08/2017   [name] [name] 07/08/2017, 4:18 PM

## 2017-07-08 NOTE — Progress Notes (Signed)
PHARMACY - ADULT TOTAL PARENTERAL NUTRITION CONSULT NOTE   Pharmacy Consult for TPN Indication: prolonged ileus, small bowel leak, multiple bowel surgeries  Patient Measurements: Body mass index is 25.21 kg/m. Filed Weights   07/02/17 0500 07/03/17 0450 07/04/17 0414  Weight: 172 lb 9.9 oz (78.3 kg) 173 lb 11.6 oz (78.8 kg) 175 lb 11.3 oz (79.7 kg)   HPI: 63 yoM admitted on 7/11 for enlarging adrenal mass and planned adrenalectomy.  Pharmacy consulted to dose TPN.  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   7/22 OR:  wash out, tube ileostomy, left with open abdomen & wound vac in place.  Plan for OR on 7/25 for closure 7/25 OR: mesh placed for abdominal closure, wound vac 8/2 extubated, propofol drip off 8/4 TPN off for ~ 5 hours due to line detached from filter 8/10 MD request decrease rate x 1 week 2nd low sodium 8/12 Additional sodium added to TPN due to persistent hyponatremia 8/16 Increasing rate back to goal since sodium has improved with above intervention.   8/20 Prealbumin decreased.  New apparent enteric leak per surgery notes. 8/21 CaPhos product elevated, remove electrolytes from TPN 8/25 PICC did not have blood return in 2 of the 3 ports.  Alteplase intra-catheter and function returned.  TPN infusing correctly on 8/26 per RN. 8/28: d/c SSI 8/30: added electrolytes to TPN 8/31: TPN IV line came out around ~0340 and current bag was stopped at this time.  Restart new clinimix 5/15 1L bag (no lytes since phos is elevated this morning) until next bag is due at [facility]. Will not add MVI and trace elements to this 1L  bag. Use electrolyte free clinimix with new bag at [facility] 9/1 NGT out 9/3 added electrolytes to TPN 9/4 lytes out of TPN 9/7 cycle over 18 hrs 9/8 add back electrolytes to TPN and cycle over 14 hours 9/9 lytes out of TPN, continue 14 hr cycle, DC SSI/CBGs  Insulin requirements past 24 hours: none (no hx DM)  Current Nutrition: NPO x ice chips, popsicles 9/5  IVF: none. Lasix 20 IV qday  Central access: PICC line ordered 7/19, placed on 7/20 TPN start date: 7/20  ASSESSMENT                                                                                                           Today, 07/08/17  - Glucose (goal <150): at goal -  Electrolytes: K 3.0 (goal > = 4 for ileus), Mag 1.3 ( goal >-2 for ileus), Na 134,  Phos 4.6, CorrCa 10.04, Ca/phos product 46 = goal is < 55 - Renal:  SCr wnl,  BUN elevated at 29  but trending down  - LFTs (9/10): WNL except Alk Phos which is  elevated at 199, but trending down.    - TGs:   206 (9/3), 227 (9/10) - Prealbumin:  <5 (7/20), 6.1 (7/24), 10.1 (7/30) 24.8 (8/6), 26.4 (8/13), 15.9 (8/20), 19.7 (8/27) 24.7 (9/3) 23.7 (9/10)  -  9/4 pall care dc dilaudid PCA>> fent patch, prn fentanyl, dc thorazine, continued reglan 10 IV q6h, PPI IV BID and pepcid 40 in TPN Started scheduled zofran, zyprexa, prn ativan, scheduled IV diazepam, rec trial of octreotide - 9/4 RN unable to get NGT to insert - 9/6 Nausea much improved after meds adjusted  NUTRITIONAL GOALS                                                                                             RD recs (9/3):  122-138 gms Protein (1.5 - 1.7 g/kg) 2030 - 2270 Kcal  Recalculation: Clinimix 5/15 over 14h cycle with lipids 3x/week on MWF only would provide: 132g protein and avg 2080 kcal/day  - Average glucose infusion rate will be 4.11 mg/kg/min (Maximum 5 mg/kg/min)  PLAN       Now: Mag Sulfate 4g IV x 2 doses, KCl 23mq x 6 runs  At 1800 today: ? Continue Clinimix 5/15 for total of 26469mday to  cycle over 14hrs   Add sodium to TPN  (add 125 mEq of Na/L in a 1:1 ClSJ:GGEZMOQatio) for hyponatremia ? Lipids at 2060mhr x 12 hours 3 times weekly on MWF  MVI/TE in TPN and pepcid 40 mg/day in TPN  TPN lab panels on Mondays & Thursdays.  Mag & Phos daily  EriPeggyann JubaharmD, BCPS Pager: [phone]/2018 7:29 AM

## 2017-07-08 NOTE — Consult Note (Signed)
[place] Nurse wound follow up Wound type:Surgical NPWT dressing change. Measurement: 5cm x 18cm x 2cm with undermining 0.2 from 5-7 o'clock with brown drainage pooling in area once VAC removed.  Wound bed:80% red moist wound bed, with 20% brown fecal material Drainage (amount, consistency, odor) brown, malodorous Periwound: macerated around edges, drape applied surrounding wound to keep this area dry. Dressing procedure/placement/frequency: After pt was premedicated, I removed drape with aerosol adhesive remover, removed one piece black foam and one piece of white foam which was brown with drainage. Cleansed wound and surrounding area with NS, patted dry, placed one piece of white foam to wound bed, covered with one piece of black foam to obliterate space, covered with drape, attached to 138mmHg of continuous negative pressure, immediately seal was achieved. We will continue to follow this patient and remain available for nursing and medical and surgical teams. Will return Friday for dressing change.   Fara Olden, RN-C, WTA-C Wound Treatment Associate

## 2017-07-08 NOTE — Progress Notes (Signed)
Patient had blood tinged secretions from his wound vac and sanguinous secretions in his JP drain (75mls). Patient and his wife expressed concern about the color of the secretions which according to them started in the morning. MD on-call for [facility] was made aware, order for CBC to be added to his morning lab was received. Patient was assured that the drainage will monitored thru the night.

## 2017-07-08 NOTE — Progress Notes (Signed)
Mr. Hannis is steadily improving. No nausea, has been able to take ice chips, fluids PO and had a "frosty" yesterday with no nausea or vomiting. Pain is under excellent control- the only issue he has is getting OOB which causes severe pain but it doesn't last long-he has been walking the halls and really enjoys this. I encouraged mobility and mental distraction.   Plan: 1. High probability he had a component of esophageal thrush- his oral thrush has responded to fluconazole so given his complicated issues with PO intake and nausea should consider 14-21 day course for complete treatment.  2. Will gradually start to cut back on the anti-nausea meds- he is doing very well from this perspective.  3. Anxiety, anticipatory anxiety are major issues- he handles benzodiazapines very well and they do appear to help him- he sleeps at night and is awake most of the day -no issues with daytime sedation or confusion- would continue for now.  4. Maintain current fentanyl transdermal dosing.  5. Bowel regimen- no BMX2- dulcolax suppository PRN  6. Requested spiritual care see him for additional support.  I will continue to follow him as needed during his hospitalization.  Lane Hacker, DO Palliative Medicine [phone]  Time: 35 min Greater than 50%  of this time was spent counseling and coordinating care related to the above assessment and plan.

## 2017-07-08 NOTE — Progress Notes (Signed)
   07/08/17 1000  Clinical Encounter Type  Visited With Patient  Visit Type Follow-up  Referral From Nurse  Consult/Referral To Chaplain  Spiritual Encounters  Spiritual Needs Prayer;Emotional  Stress Factors  Patient Stress Factors Other (Comment) (Long time hopsitalization )   Visited patient on follow up from consult request.  Patient has been hospitalized for a long time.  Wife has been coming back and forth.  They have 2 teenage children.  Patient seemed comfortable and said today was a good day so far and hopes the rest of the day will go well. Patient has a somewhat positive outlook and taking one day at a time.  He wants to get better and stronger to be able to go home. Patient reflected on what he will look forward to most going home. Patient has a strong faith and relies on his trust in God.  Will be sure he is visited again in the next day or so. Wife had been present over night and had gone home for a while.

## 2017-07-08 NOTE — Progress Notes (Signed)
Physical Therapy Treatment Patient Details Name: [name] [name] MRN: [id number] DOB: [birth year] Today's Date: 07/08/2017    History of Present Illness  56 y.o. 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 On 7/18, he developed a leak therefore was taken back to OR for re-do of ex-lap and repair of leak on 05/20/17.  He returned to the ICU on the vent . Extubated on 05/28/17.     PT Comments    Idan is progressing well with his mobility.  Tolerated an increased distance. HR at rest 110.  HR during amb 133.  Follow Up Recommendations  SNF;Supervision/Assistance - 24 hour     Equipment Recommendations  Rolling walker with 5" wheels    Recommendations for Other Services       Precautions / Restrictions Precautions Precautions: Fall Precaution Comments: multiple lines in abdomen, VAC, , and JP drain Restrictions Weight Bearing Restrictions: No    Mobility  Bed Mobility Overal bed mobility: Needs Assistance Bed Mobility: Supine to Sit     Supine to sit: Min guard;Min assist     General bed mobility comments: min assist for upper body due to ABD "discomfort" and no assist B LE's off bed.  required extended sitting rest break EOB before amb   Transfers Overall transfer level: Needs assistance Equipment used: Rolling walker (2 wheeled) Transfers: Sit to/from Stand Sit to Stand: Supervision;Min guard Stand pivot transfers: Supervision;Min guard       General transfer comment: assist for lines/leads/drains and one VC to reach back prior to sit  Ambulation/Gait Ambulation/Gait assistance: Min guard;+2 safety/equipment Ambulation Distance (Feet): 735 Feet Assistive device: Rolling walker (2 wheeled) Gait Pattern/deviations: Step-through pattern;Decreased step length - right;Decreased step  length - left;Shuffle;Trunk flexed Gait velocity: WFL   General Gait Details: tolerated an increased amb distance.  Highest HR was 133     Stairs            Wheelchair Mobility    Modified Rankin (Stroke Patients Only)       Balance                                            Cognition Arousal/Alertness: Awake/alert Behavior During Therapy: WFL for tasks assessed/performed Overall Cognitive Status: Within Functional Limits for tasks assessed                                 General Comments: pleasant/cooperative      Exercises      General Comments        Pertinent Vitals/Pain Pain Assessment: No/denies pain    Home Living                      Prior Function            PT Goals (current goals can now be found in the care plan section) Progress towards PT goals: Progressing toward goals    Frequency    Min 3X/week      PT Plan Current plan remains appropriate    Co-evaluation              AM-PAC PT "6 Clicks" Daily Activity  Outcome Measure  Difficulty turning over in bed (including adjusting bedclothes, sheets and blankets)?: A Lot Difficulty moving from lying on back to sitting on the side of the bed? : A Lot Difficulty sitting down on and standing up from a chair with arms (e.g., wheelchair, bedside commode, etc,.)?: A Lot Help needed moving to and from a bed to chair (including a wheelchair)?: A Lot Help needed walking in hospital room?: A Lot Help needed climbing 3-5 steps with a railing? : A Lot 6 Click Score: 12    End of Session Equipment Utilized During Treatment: Gait belt Activity Tolerance: Patient tolerated treatment well Patient left: in chair;with call bell/phone within reach;with family/visitor present Nurse Communication: Mobility status PT Visit Diagnosis: Difficulty in walking, not elsewhere classified (R26.2);Muscle weakness (generalized) (M62.81)     Time: 7944-4619 PT  Time Calculation (min) (ACUTE ONLY): 29 min  Charges:  $Gait Training: 8-22 mins $Therapeutic Activity: 8-22 mins                    G Codes:       [name]  PTA [facility]  Acute  Rehab Pager      [phone]

## 2017-07-09 LAB — COMPREHENSIVE METABOLIC PANEL
ALBUMIN: 2.6 g/dL — AB (ref 3.5–5.0)
ALT: 38 U/L (ref 17–63)
ANION GAP: 9 (ref 5–15)
AST: 26 U/L (ref 15–41)
Alkaline Phosphatase: 192 U/L — ABNORMAL HIGH (ref 38–126)
BILIRUBIN TOTAL: 0.4 mg/dL (ref 0.3–1.2)
BUN: 29 mg/dL — ABNORMAL HIGH (ref 6–20)
CO2: 23 mmol/L (ref 22–32)
Calcium: 9.2 mg/dL (ref 8.9–10.3)
Chloride: 101 mmol/L (ref 101–111)
Creatinine, Ser: 0.74 mg/dL (ref 0.61–1.24)
GFR calc Af Amer: >60 mL/min (ref >60)
Glucose, Bld: 116 mg/dL — ABNORMAL HIGH (ref 65–99)
POTASSIUM: 3.5 mmol/L (ref 3.5–5.1)
Sodium: 133 mmol/L — ABNORMAL LOW (ref 135–145)
TOTAL PROTEIN: 6.9 g/dL (ref 6.5–8.1)

## 2017-07-09 LAB — MAGNESIUM: MAGNESIUM: 1.6 mg/dL — AB (ref 1.7–2.4)

## 2017-07-09 LAB — PHOSPHORUS: Phosphorus: 4 mg/dL (ref 2.5–4.6)

## 2017-07-09 MED ORDER — MAGNESIUM SULFATE 4 GM/100ML IV SOLN
4.0000 g | Freq: Once | INTRAVENOUS | Status: AC
  Administered 2017-07-09: 4 g via INTRAVENOUS
  Filled 2017-07-09: qty 100

## 2017-07-09 MED ORDER — POTASSIUM CHLORIDE 10 MEQ/50ML IV SOLN
10.0000 meq | INTRAVENOUS | Status: AC
  Administered 2017-07-09 (×4): 10 meq via INTRAVENOUS
  Filled 2017-07-09 (×6): qty 50

## 2017-07-09 MED ORDER — TRACE MINERALS CR-CU-MN-SE-ZN 10-1000-500-60 MCG/ML IV SOLN
INTRAVENOUS | Status: AC
  Administered 2017-07-09: 17:00:00 via INTRAVENOUS
  Filled 2017-07-09: qty 2644
  Filled 2017-07-09: qty 1000

## 2017-07-09 MED ORDER — ALTEPLASE 2 MG IJ SOLR
2.0000 mg | Freq: Once | INTRAMUSCULAR | Status: AC
  Administered 2017-07-09: 2 mg
  Filled 2017-07-09: qty 2

## 2017-07-09 MED ORDER — CLINIMIX E/DEXTROSE (5/15) 5 % IV SOLN
INTRAVENOUS | Status: DC
  Filled 2017-07-09: qty 2644

## 2017-07-09 MED ORDER — ALTEPLASE 2 MG IJ SOLR
2.0000 mg | Freq: Once | INTRAMUSCULAR | Status: AC
  Administered 2017-07-09: 2 mg
  Filled 2017-07-09 (×2): qty 2

## 2017-07-09 MED ORDER — FAT EMULSION 20 % IV EMUL: 240.0000 mL | INTRAVENOUS | Status: DC

## 2017-07-09 MED ORDER — POTASSIUM CHLORIDE 10 MEQ/50ML IV SOLN
10.0000 meq | INTRAVENOUS | Status: AC
  Administered 2017-07-09 (×2): 10 meq via INTRAVENOUS
  Filled 2017-07-09 (×2): qty 50

## 2017-07-09 NOTE — Progress Notes (Signed)
Physical Therapy Treatment Patient Details Name: [name] [name] MRN: [id number] DOB: 1960-12-16 Today's Date: 07/09/2017    History of Present Illness  56 y.o. 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 On 7/18, he developed a leak therefore was taken back to OR for re-do of ex-lap and repair of leak on 05/20/17.  He returned to the ICU on the vent . Extubated on 05/28/17.     PT Comments    Pt continues motivated and with improvement in ambulatory balance and activity tolerance.   Follow Up Recommendations  SNF;Supervision/Assistance - 24 hour     Equipment Recommendations  Rolling walker with 5" wheels    Recommendations for Other Services Rehab consult     Precautions / Restrictions Precautions Precautions: Fall Precaution Comments: multiple lines in abdomen, VAC, , and JP drain Restrictions Weight Bearing Restrictions: No    Mobility  Bed Mobility Overal bed mobility: Needs Assistance Bed Mobility: Supine to Sit Rolling: Supervision Sidelying to sit: Supervision          Transfers Overall transfer level: Needs assistance Equipment used: Rolling walker (2 wheeled) Transfers: Sit to/from Stand Sit to Stand: Supervision         General transfer comment: watched lines, cues for UE placement.    Ambulation/Gait Ambulation/Gait assistance: Min guard;+2 safety/equipment Ambulation Distance (Feet): 1250 Feet Assistive device: Rolling walker (2 wheeled) Gait Pattern/deviations: Step-through pattern;Decreased step length - right;Decreased step length - left;Shuffle;Trunk flexed;Narrow base of support Gait velocity: WFL Gait velocity interpretation: Below normal speed for age/gender General Gait Details: tolerated an increased amb distance.  Highest HR was 130   Stairs            Wheelchair Mobility    Modified Rankin (Stroke Patients Only)       Balance Overall balance assessment: Needs assistance Sitting-balance support: Feet supported Sitting balance-Leahy Scale: Good     Standing balance support: During functional activity Standing balance-Leahy Scale: Fair                              Cognition Arousal/Alertness: Awake/alert Behavior During Therapy: WFL for tasks assessed/performed Overall Cognitive Status: Within Functional Limits for tasks assessed                                 General Comments: pleasant/cooperative      Exercises      General Comments        Pertinent Vitals/Pain Pain Assessment: Faces Faces Pain Scale: Hurts little more Pain Location: ABD after activity Pain Descriptors / Indicators: Grimacing Pain Intervention(s): Limited activity within patient's tolerance;Monitored during session;Premedicated before session    Home Living                      Prior Function            PT Goals (current goals can now be found in the care plan section) Acute Rehab PT Goals Patient Stated Goal: to go fishing PT Goal Formulation: With patient/family Time For Goal Achievement: 07/14/17 Potential to Achieve Goals: Fair Progress towards PT goals: Progressing toward goals    Frequency    Min 3X/week      PT Plan Current plan remains appropriate  Co-evaluation              AM-PAC PT "6 Clicks" Daily Activity  Outcome Measure  Difficulty turning over in bed (including adjusting bedclothes, sheets and blankets)?: A Little Difficulty moving from lying on back to sitting on the side of the bed? : A Lot Difficulty sitting down on and standing up from a chair with arms (e.g., wheelchair, bedside commode, etc,.)?: A Lot Help needed moving to and from a bed to chair (including a wheelchair)?: A Little Help needed walking in hospital room?: A Little Help needed climbing 3-5  steps with a railing? : A Lot 6 Click Score: 15    End of Session Equipment Utilized During Treatment: Gait belt Activity Tolerance: Patient tolerated treatment well Patient left: in chair;with call bell/phone within reach;with family/visitor present Nurse Communication: Mobility status PT Visit Diagnosis: Difficulty in walking, not elsewhere classified (R26.2);Muscle weakness (generalized) (M62.81)     Time: 7341-9379 PT Time Calculation (min) (ACUTE ONLY): 29 min  Charges:  $Gait Training: 23-37 mins                    G Codes:       Pg [phone]    Kandi Brusseau 07/09/2017, 12:33 PM

## 2017-07-09 NOTE — Progress Notes (Signed)
Patient ID: [name] [name], male   DOB: 08/23/1961, 56 y.o.   MRN: [id number] [facility] Surgery Progress Note:   50 Days Post-Op  Subjective: Mental status is clear.  Will discuss moving him out of stepdown and to the floor.   Objective: Vital signs in last 24 hours: Temp:  [97.8 F (36.6 C)-98.5 F (36.9 C)] 98.3 F (36.8 C) (09/12 2334) Pulse Rate:  [93-110] 97 (09/13 0735) Resp:  [11-23] 18 (09/13 0735) BP: (104-120)/(70-77) 114/77 (09/13 0600) SpO2:  [97 %-100 %] 100 % (09/13 0735) Weight:  [78.8 kg (173 lb 11.6 oz)] 78.8 kg (173 lb 11.6 oz) (09/13 0500)  Intake/Output from previous day: 09/12 0701 - 09/13 0700 In: 9024 [P.O.:120; I.V.:2867; IV Piggyback:100] Out: 0973 [Urine:1325; Drains:415] Intake/Output this shift: No intake/output data recorded.  Physical Exam: Work of breathing is OK and unchanged.    Lab Results:  Results for orders placed or performed during the hospital encounter of 05/06/17 (from the past 48 hour(s))  Magnesium     Status: Abnormal   Collection Time: 07/08/17  3:39 AM  Result Value Ref Range   Magnesium 1.3 (L) 1.7 - 2.4 mg/dL  Phosphorus     Status: None   Collection Time: 07/08/17  3:39 AM  Result Value Ref Range   Phosphorus 4.6 2.5 - 4.6 mg/dL  Basic metabolic panel     Status: Abnormal   Collection Time: 07/08/17  3:39 AM  Result Value Ref Range   Sodium 134 (L) 135 - 145 mmol/L   Potassium 3.0 (L) 3.5 - 5.1 mmol/L    Comment: DELTA CHECK NOTED REPEATED TO VERIFY    Chloride 102 101 - 111 mmol/L   CO2 24 22 - 32 mmol/L   Glucose, Bld 130 (H) 65 - 99 mg/dL   BUN 29 (H) 6 - 20 mg/dL   Creatinine, Ser 0.67 0.61 - 1.24 mg/dL   Calcium 9.0 8.9 - 10.3 mg/dL   GFR calc non Af Amer >60 >60 mL/min   GFR calc Af Amer >60 >60 mL/min    Comment: (NOTE) The eGFR has been calculated using the CKD EPI equation. This calculation has not been validated in all clinical situations. eGFR's persistently <60 mL/min signify possible Chronic  Kidney Disease.    Anion gap 8 5 - 15  CBC     Status: Abnormal   Collection Time: 07/08/17  3:39 AM  Result Value Ref Range   WBC 9.7 4.0 - 10.5 K/uL   RBC 3.59 (L) 4.22 - 5.81 MIL/uL   Hemoglobin 9.0 (L) 13.0 - 17.0 g/dL   HCT 28.2 (L) 39.0 - 52.0 %   MCV 78.6 78.0 - 100.0 fL   MCH 25.1 (L) 26.0 - 34.0 pg   MCHC 31.9 30.0 - 36.0 g/dL   RDW 17.1 (H) 11.5 - 15.5 %   Platelets 401 (H) 150 - 400 K/uL  Magnesium     Status: Abnormal   Collection Time: 07/09/17  7:22 AM  Result Value Ref Range   Magnesium 1.6 (L) 1.7 - 2.4 mg/dL  Phosphorus     Status: None   Collection Time: 07/09/17  7:22 AM  Result Value Ref Range   Phosphorus 4.0 2.5 - 4.6 mg/dL  Comprehensive metabolic panel     Status: Abnormal   Collection Time: 07/09/17  7:22 AM  Result Value Ref Range   Sodium 133 (L) 135 - 145 mmol/L   Potassium 3.5 3.5 - 5.1 mmol/L   Chloride 101 101 -  111 mmol/L   CO2 23 22 - 32 mmol/L   Glucose, Bld 116 (H) 65 - 99 mg/dL   BUN 29 (H) 6 - 20 mg/dL   Creatinine, Ser 0.74 0.61 - 1.24 mg/dL   Calcium 9.2 8.9 - 10.3 mg/dL   Total Protein 6.9 6.5 - 8.1 g/dL   Albumin 2.6 (L) 3.5 - 5.0 g/dL   AST 26 15 - 41 U/L   ALT 38 17 - 63 U/L   Alkaline Phosphatase 192 (H) 38 - 126 U/L   Total Bilirubin 0.4 0.3 - 1.2 mg/dL   GFR calc non Af Amer >60 >60 mL/min   GFR calc Af Amer >60 >60 mL/min    Comment: (NOTE) The eGFR has been calculated using the CKD EPI equation. This calculation has not been validated in all clinical situations. eGFR's persistently <60 mL/min signify possible Chronic Kidney Disease.    Anion gap 9 5 - 15    Radiology/Results: No results found.  Anti-infectives: Anti-infectives    Start     Dose/Rate Route Frequency Ordered Stop   07/08/17 1600  fluconazole (DIFLUCAN) IVPB 100 mg     100 mg 50 mL/hr over 60 Minutes Intravenous Every 24 hours 07/08/17 0828 07/15/17 1559   07/01/17 1500  fluconazole (DIFLUCAN) IVPB 100 mg     100 mg 50 mL/hr over 60 Minutes  Intravenous Every 24 hours 07/01/17 1401 07/07/17 1715   06/16/17 1200  piperacillin-tazobactam (ZOSYN) IVPB 3.375 g     3.375 g 12.5 mL/hr over 240 Minutes Intravenous Every 8 hours 06/16/17 1026 06/17/17 0004   06/08/17 1200  piperacillin-tazobactam (ZOSYN) IVPB 3.375 g  Status:  Discontinued     3.375 g 12.5 mL/hr over 240 Minutes Intravenous Every 8 hours 06/08/17 1056 06/16/17 1026   05/29/17 2200  ceFEPIme (MAXIPIME) 2 g in dextrose 5 % 50 mL IVPB  Status:  Discontinued     2 g 100 mL/hr over 30 Minutes Intravenous Every 8 hours 05/29/17 2031 06/03/17 0832   05/29/17 2100  metroNIDAZOLE (FLAGYL) IVPB 500 mg  Status:  Discontinued     500 mg 100 mL/hr over 60 Minutes Intravenous Every 8 hours 05/29/17 2029 06/03/17 0832   05/26/17 2200  ceFAZolin (ANCEF) IVPB 1 g/50 mL premix  Status:  Discontinued     1 g 100 mL/hr over 30 Minutes Intravenous Every 8 hours 05/26/17 1008 05/29/17 2023   05/25/17 1100  vancomycin (VANCOCIN) 1,250 mg in sodium chloride 0.9 % 250 mL IVPB  Status:  Discontinued     1,250 mg 166.7 mL/hr over 90 Minutes Intravenous Every 12 hours 05/25/17 0955 05/26/17 0946   05/22/17 1400  anidulafungin (ERAXIS) 100 mg in sodium chloride 0.9 % 100 mL IVPB     100 mg 78 mL/hr over 100 Minutes Intravenous Every 24 hours 05/21/17 1330 05/27/17 1740   05/21/17 1400  anidulafungin (ERAXIS) 200 mg in sodium chloride 0.9 % 200 mL IVPB     200 mg 78 mL/hr over 200 Minutes Intravenous  Once 05/21/17 1330 05/21/17 1940   05/15/17 1000  anidulafungin (ERAXIS) 100 mg in sodium chloride 0.9 % 100 mL IVPB  Status:  Discontinued     100 mg 78 mL/hr over 100 Minutes Intravenous Every 24 hours 05/14/17 0829 05/16/17 0901   05/15/17 1000  metroNIDAZOLE (FLAGYL) IVPB 500 mg  Status:  Discontinued     500 mg 100 mL/hr over 60 Minutes Intravenous Every 8 hours 05/15/17 0903 05/25/17 0938   05/14/17 0900  anidulafungin (ERAXIS) 200 mg in sodium chloride 0.9 % 200 mL IVPB     200 mg 78  mL/hr over 200 Minutes Intravenous  Once 05/14/17 0829 05/14/17 1325   05/13/17 1927  vancomycin (VANCOCIN) 1-5 GM/200ML-% IVPB    Comments:  Ward, Christa   : cabinet override      05/13/17 1927 05/14/17 0744   05/12/17 1400  ceFEPIme (MAXIPIME) 1 g in dextrose 5 % 50 mL IVPB     1 g 100 mL/hr over 30 Minutes Intravenous Every 8 hours 05/12/17 0939 05/26/17 2359   05/12/17 0900  vancomycin (VANCOCIN) IVPB 1000 mg/200 mL premix  Status:  Discontinued     1,000 mg 200 mL/hr over 60 Minutes Intravenous Every 12 hours 05/12/17 0750 05/16/17 0852   05/12/17 0830  aztreonam (AZACTAM) 2 GM IVPB     2 g 100 mL/hr over 30 Minutes Intravenous  Once 05/12/17 0800 05/12/17 0957   05/06/17 0916  vancomycin (VANCOCIN) IVPB 1000 mg/200 mL premix     1,000 mg 200 mL/hr over 60 Minutes Intravenous On call to O.R. 05/06/17 0916 05/06/17 1229      Assessment/Plan: Problem List: Patient Active Problem List   Diagnosis Date Noted  . Hypomagnesemia 06/28/2017  . Hypothyroidism   . 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 Protein-calorie malnutrition, severe ([place]) 06/27/2017  . 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 Pressure injury of skin 05/29/2017  . Ileus ([place])   . Hypokalemia 05/14/2017  . Anastomotic leak of intestine 05/14/2017  . Wound dehiscence, surgical 05/14/2017  . Aspiration pneumonia ([place]) 05/14/2017  . Chronic narcotic use 05/10/2017  . Generalized anxiety disorder 05/10/2017  . Intra-abdominal adhesions s/p SB resection 05/06/2017 05/09/2017  . Throat symptom 03/18/2016  . Multinodular goiter 01/19/2016  . Hyperthyroidism 01/19/2016  . Essential hypertension   . Diarrhea 11/30/2014  . Anemia, iron deficiency 11/01/2014  . Leukocytosis 11/03/2013  . Tobacco abuse 07/26/2013  . COPD (chronic obstructive pulmonary disease) ([place]) 07/26/2013  . Left knee pain 07/26/2013  . Pain in joint, shoulder region 05/03/2013  . GERD 07/24/2008  .  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 Hassell Done, MD, [facility] [phone] beeper [phone]  07/09/2017 10:06 AM

## 2017-07-09 NOTE — Progress Notes (Signed)
[place] NOTE   Pharmacy Consult for TPN Indication: prolonged ileus, small bowel leak, multiple bowel surgeries  Patient Measurements: Body mass index is 24.93 kg/m. Filed Weights   07/03/17 0450 07/04/17 0414 07/09/17 0500  Weight: 173 lb 11.6 oz (78.8 kg) 175 lb 11.3 oz (79.7 kg) 173 lb 11.6 oz (78.8 kg)   HPI: 26 yoM admitted on 7/11 for enlarging adrenal mass and planned adrenalectomy.  Pharmacy consulted to dose TPN.  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   7/22 OR:  wash out, tube ileostomy, left with open abdomen & wound vac in place.  Plan for OR on 7/25 for closure 7/25 OR: mesh placed for abdominal closure, wound vac 8/2 extubated, propofol drip off 8/4 TPN off for ~ 5 hours due to line detached from filter 8/10 MD request decrease rate x 1 week 2nd low sodium 8/12 Additional sodium added to TPN due to persistent hyponatremia 8/16 Increasing rate back to goal since sodium has improved with above intervention.   8/20 Prealbumin decreased.  New apparent enteric leak per surgery notes. 8/21 CaPhos product elevated, remove electrolytes from TPN 8/25 PICC did not have blood return in 2 of the 3 ports.  Alteplase intra-catheter and function returned.  TPN infusing correctly on 8/26 per RN. 8/28: d/c SSI 8/30: added electrolytes to TPN 8/31: TPN IV line came out around ~0340 and current bag was stopped at this time.  Restart new clinimix 5/15 1L bag (no lytes since phos is elevated this morning) until next bag is due at [facility]. Will not add MVI and trace elements to this 1L  bag. Use electrolyte free clinimix with new bag at [facility] 9/1 NGT out 9/3 added electrolytes to TPN 9/4 lytes out of TPN 9/7 cycle over 18 hrs 9/8 add back electrolytes to TPN and cycle over 14 hours 9/9 lytes out of TPN, continue 14 hr cycle, DC SSI/CBGs 9/13 electrolytes added back to TPN  Insulin requirements past 24 hours: none (no hx DM)  Current Nutrition: NPO x ice chips, popsicles 9/5  IVF: none. Lasix 20 IV qday  Central access: PICC line ordered 7/19, placed on 7/20 TPN start date: 7/20  ASSESSMENT                                                                                                           Today, 07/09/17  - Glucose (goal <150): at goal -  Electrolytes: K 3.0 (goal > = 4 for ileus), Mag 1.3 ( goal >-2 for ileus), Na 134,  Phos 4.6, CorrCa 10.04, Ca/phos product 46 = goal is < 55 - Renal:  SCr wnl,  BUN elevated at 29  but trending down  - LFTs (9/10):  WNL except Alk Phos which is elevated at 199, but trending down.    - TGs:   206 (9/3), 227 (9/10) - Prealbumin:  <5 (7/20), 6.1 (7/24), 10.1 (7/30) 24.8 (8/6), 26.4 (8/13), 15.9 (8/20), 19.7 (8/27) 24.7 (9/3) 23.7 (9/10)  -  9/4 pall care dc dilaudid PCA>> fent patch, prn fentanyl, dc thorazine, continued reglan 10 IV q6h, PPI IV BID and pepcid 40 in TPN Started scheduled zofran, zyprexa, prn ativan, scheduled IV diazepam, rec trial of octreotide - 9/4 RN unable to get NGT to insert - 9/6 Nausea much improved after meds adjusted  NUTRITIONAL GOALS                                                                                             RD recs (9/3):  122-138 gms Protein (1.5 - 1.7 g/kg) 2030 - 2270 Kcal  Recalculation: Clinimix 5/15 over 14h cycle with lipids 3x/week on MWF only would provide: 132g protein and avg 2080 kcal/day  - Average glucose infusion rate will be 4.11 mg/kg/min (Maximum 5 mg/kg/min)  PLAN       Now: Mag Sulfate 4g IV x 2 doses, KCl 57mq x 6 runs  At 1800 today: ? Add electrolytes  back to TPN since Phos has been WNL x 2 days and has required high dose supplementation of K and Mg.  Continue add Na to provide the equivalent of normal saline (~150 meq/L) with a chloride to acetate ratio of 1:1 ? Clinimix E 5/15 for total of 26454mday to cycle over 14hrs ? Lipids at 2084mhr x 12 hours 3 times weekly on MWF  MVI/TE in TPN and pepcid 40 mg/day in TPN  TPN lab panels on Mondays & Thursdays.  Mag & Phos daily  EriPeggyann JubaharmD, BCPS Pager: [phone] 9:11 AM

## 2017-07-09 NOTE — Progress Notes (Signed)
Nutrition Follow-up  DOCUMENTATION CODES:   Not applicable  INTERVENTION:  - Continue cyclic TPN per Pharmacy. - Will continue to monitor for plan concerning repeat surgery. - Will continue to monitor for possibility to advance diet in the future.   NUTRITION DIAGNOSIS:   Inadequate oral intake related to inability to eat as evidenced by NPO status. -ongoing  GOAL:   Patient will meet greater than or equal to 90% of their needs -met with cyclic TPN regimen.   MONITOR:   Diet advancement, Weight trends, Labs, Skin, I & O's, Other (Comment) (TPN regimen)  ASSESSMENT:   56 y.o. 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 On 7/18, he developed a leak therefore was taken back to OR for re-do of ex-lap and repair of leak.  Significant events: 7/11 WU:JWJXB adrenalectomy with complication of intestinal injury and small bowel resection with anastomosis  7/18 JY:NWGNFA of small bowel anastomotic leak, abdominal closure of wound dehisence 7/19 OR: reopening of recent laparotomy, lysis of adhesions, noted ischemic perforation of new loop of intestine, intact repair of previous anastomotic leak repair, intact previous anastomosis.  7/22 OR: wash out, tube ileostomy, left with open abdomen &wound vac in place. Plan for OR on 7/25 for closure 7/25 OR: mesh placed for abdominal closure, wound vac 8/2extubated, propofol drip off 8/4TPN off for ~ 5 hours due to line detached from filter. Ultrasound-guided aspiration By interventional radiology of small perihepatic fluid collection yielded only 1 mL of the fluid, sent for culture. NPO - mild coffee ground on NG but no active bleed 8/6patient improving. Off Precedex drip since 8/4. Dilaudid Drip being changed over to PCA. Still with some coffee ground-looking  material in NG tube.  8/10 hyponatremia and plan to decrease TPN from 3L/day to 2L/day x1 week 8/93fund to have fecal matter in wound vac (EC fistula) 8/14increase in fecal matter presence in wound vac with no plan for surgery at this time 8/16TPN advanced to goal rate of 115 ml/hr with 20% ILE @ 20 mL/hr x12 hours on MWF 8/22: NGT clamped. RD to order Boost Breeze for patient to sip on; Mycostatin started for oral thrush. 9/1:pulled NGT and no plans for replacement. 9/1:developed N/V and only permitted sips of clears, if tolerated. 9/4:NGT re-insertion attempted several times without success 9/7: begin cycling TPN   9/13 Pt remains NPO. Palliative Care following and last saw pt yesterday; note states "high probability he had a component of esophageal thrush- his oral thrush has responded to fluconazole so given his complicated issues with PO intake and nausea should consider 14-21 day course for complete treatment." WOC saw pt yesterday and note states wound with 20% brown fecal matter. Dr. MEarlie Servernote states ongoing plan for surgery later this month; will monitor for when date is able to be set.  Pt with triple lumen PICC and continued cyclic TPN regimen. Weight -2 lbs/0.9 kg from 9/8 through this AM.   Medications reviewed; 20 mg IV Lasix/day, 4 g IV Mg sulfate x1 run yesterday and x2 runs today, 10 mg IV Reglan TID, 40 mg IV Protonix BID, 10 mEq IV KCl x6 runs today and x5 runs yesterday,  Labs reviewed; Na: 133 mmol/L, glucose: 116 mg/dL today, BUN: 39 mg/dL, Mg: 1.6 mg/dL, Alk Phos elevated but trending down from yesterday to today, triglycerides: 227 mg/dL on 9/10.  9/10 - Pt remains NPO with allowance for popsicles and ice chips as tolerated.  - NGT remains out since 9/1.  - Pt with triple lumen PICC and receiving cyclic TPN: Clinimix 9/40 @ 50 mL/hr x1 hour, 212 mL/hr x12 hours, 50 mL/hr x1 hour (2644 mL/day) with 20% ILE @ 20 mL/hr x12 hours on MWF.  - This regimen  is providing a daily average of 132 grams of protein and 2083 kcal; meeting 100% estimated protein and kcal need.  - Weight continues to fluctuate. - Per Dr. Gala Lewandowsky note this AM, plan is still for reconstructive surgery at the end of the month and Dr. Hassell Done to discuss this during this week.  - Pt remains free of N/V.   10 mg IV Reglan TID, 4 mg IV Zofran TIDPhos: 5.4 mg/dL.     9/7 - Pt discussed in rounds this AM.  - Pharmacist reports plan to begin cycling TPN; see note today at 0707 for further details on this plan.  - Per this RD's note on 8/29, recommended Clinimix (E) 5/15 x16 hours (50 mL/hr x1 hour, 190 mL/hr x14 hours, 50 mL/hr x1 hour; total volume: 9050 mL)  - This cyclic regimen would provide a daily average (if ILE continued on to be provided MWF) of 2166 kcal and 138 grams of protein.  - Pharmacist states hope of being able to cycle down to only 14 hours/day.    Diet Order:  Diet NPO time specified Except for: Ice Chips, Other (See Comments)  Skin:  Abdominal wound from 7/25 with wound vac.   Last BM:  9/12 via ileostomy  Height:   Ht Readings from Last 1 Encounters:  05/26/17 '5\' 10"'  (1.778 m)    Weight:   Wt Readings from Last 1 Encounters:  07/09/17 173 lb 11.6 oz (78.8 kg)    Ideal Body Weight:  75.45 kg  BMI:  Body mass index is 24.93 kg/m.  Estimated Nutritional Needs:   Kcal:  2030-2270 (25-28 kcal/kg)  Protein:  122-138 grams (1.5-1.7 grams/kg)  Fluid:  2 L/day  EDUCATION NEEDS:   No education needs identified at this time    Jarome Matin, MS, RD, LDN, CNSC Inpatient Clinical Dietitian Pager # [phone] After hours/weekend pager # [phone]

## 2017-07-09 NOTE — Progress Notes (Signed)
Patient ID: [name] [name], male   DOB: 1961-06-17, 56 y.o.   MRN: [id number] [facility] Surgery Progress Note:   50 Days Post-Op  Subjective: Mental status has been better this week.   Objective: Vital signs in last 24 hours: Temp:  [97.8 F (36.6 C)-98.5 F (36.9 C)] 98.3 F (36.8 C) (09/12 2334) Pulse Rate:  [86-110] 93 (09/13 0200) Resp:  [13-23] 19 (09/13 0200) BP: (104-122)/(70-84) 109/75 (09/13 0200) SpO2:  [97 %-100 %] 98 % (09/13 0200) Weight:  [78.8 kg (173 lb 11.6 oz)] 78.8 kg (173 lb 11.6 oz) (09/13 0500)  Intake/Output from previous day: 09/12 0701 - 09/13 0700 In: 3147 [P.O.:120; I.V.:2867; IV Piggyback:100] Out: 7209 [Urine:1050; Drains:415] Intake/Output this shift: No intake/output data recorded.  Physical Exam: Work of breathing has not been labored.  Drains intact  Lab Results:  Results for orders placed or performed during the hospital encounter of 05/06/17 (from the past 48 hour(s))  Magnesium     Status: Abnormal   Collection Time: 07/08/17  3:39 AM  Result Value Ref Range   Magnesium 1.3 (L) 1.7 - 2.4 mg/dL  Phosphorus     Status: None   Collection Time: 07/08/17  3:39 AM  Result Value Ref Range   Phosphorus 4.6 2.5 - 4.6 mg/dL  Basic metabolic panel     Status: Abnormal   Collection Time: 07/08/17  3:39 AM  Result Value Ref Range   Sodium 134 (L) 135 - 145 mmol/L   Potassium 3.0 (L) 3.5 - 5.1 mmol/L    Comment: DELTA CHECK NOTED REPEATED TO VERIFY    Chloride 102 101 - 111 mmol/L   CO2 24 22 - 32 mmol/L   Glucose, Bld 130 (H) 65 - 99 mg/dL   BUN 29 (H) 6 - 20 mg/dL   Creatinine, Ser 0.67 0.61 - 1.24 mg/dL   Calcium 9.0 8.9 - 10.3 mg/dL   GFR calc non Af Amer >60 >60 mL/min   GFR calc Af Amer >60 >60 mL/min    Comment: (NOTE) The eGFR has been calculated using the CKD EPI equation. This calculation has not been validated in all clinical situations. eGFR's persistently <60 mL/min signify possible Chronic Kidney Disease.    Anion gap 8  5 - 15  CBC     Status: Abnormal   Collection Time: 07/08/17  3:39 AM  Result Value Ref Range   WBC 9.7 4.0 - 10.5 K/uL   RBC 3.59 (L) 4.22 - 5.81 MIL/uL   Hemoglobin 9.0 (L) 13.0 - 17.0 g/dL   HCT 28.2 (L) 39.0 - 52.0 %   MCV 78.6 78.0 - 100.0 fL   MCH 25.1 (L) 26.0 - 34.0 pg   MCHC 31.9 30.0 - 36.0 g/dL   RDW 17.1 (H) 11.5 - 15.5 %   Platelets 401 (H) 150 - 400 K/uL    Radiology/Results: No results found.  Anti-infectives: Anti-infectives    Start     Dose/Rate Route Frequency Ordered Stop   07/08/17 1600  fluconazole (DIFLUCAN) IVPB 100 mg     100 mg 50 mL/hr over 60 Minutes Intravenous Every 24 hours 07/08/17 0828 07/15/17 1559   07/01/17 1500  fluconazole (DIFLUCAN) IVPB 100 mg     100 mg 50 mL/hr over 60 Minutes Intravenous Every 24 hours 07/01/17 1401 07/07/17 1715   06/16/17 1200  piperacillin-tazobactam (ZOSYN) IVPB 3.375 g     3.375 g 12.5 mL/hr over 240 Minutes Intravenous Every 8 hours 06/16/17 1026 06/17/17 0004  06/08/17 1200  piperacillin-tazobactam (ZOSYN) IVPB 3.375 g  Status:  Discontinued     3.375 g 12.5 mL/hr over 240 Minutes Intravenous Every 8 hours 06/08/17 1056 06/16/17 1026   05/29/17 2200  ceFEPIme (MAXIPIME) 2 g in dextrose 5 % 50 mL IVPB  Status:  Discontinued     2 g 100 mL/hr over 30 Minutes Intravenous Every 8 hours 05/29/17 2031 06/03/17 0832   05/29/17 2100  metroNIDAZOLE (FLAGYL) IVPB 500 mg  Status:  Discontinued     500 mg 100 mL/hr over 60 Minutes Intravenous Every 8 hours 05/29/17 2029 06/03/17 0832   05/26/17 2200  ceFAZolin (ANCEF) IVPB 1 g/50 mL premix  Status:  Discontinued     1 g 100 mL/hr over 30 Minutes Intravenous Every 8 hours 05/26/17 1008 05/29/17 2023   05/25/17 1100  vancomycin (VANCOCIN) 1,250 mg in sodium chloride 0.9 % 250 mL IVPB  Status:  Discontinued     1,250 mg 166.7 mL/hr over 90 Minutes Intravenous Every 12 hours 05/25/17 0955 05/26/17 0946   05/22/17 1400  anidulafungin (ERAXIS) 100 mg in sodium chloride 0.9  % 100 mL IVPB     100 mg 78 mL/hr over 100 Minutes Intravenous Every 24 hours 05/21/17 1330 05/27/17 1740   05/21/17 1400  anidulafungin (ERAXIS) 200 mg in sodium chloride 0.9 % 200 mL IVPB     200 mg 78 mL/hr over 200 Minutes Intravenous  Once 05/21/17 1330 05/21/17 1940   05/15/17 1000  anidulafungin (ERAXIS) 100 mg in sodium chloride 0.9 % 100 mL IVPB  Status:  Discontinued     100 mg 78 mL/hr over 100 Minutes Intravenous Every 24 hours 05/14/17 0829 05/16/17 0901   05/15/17 1000  metroNIDAZOLE (FLAGYL) IVPB 500 mg  Status:  Discontinued     500 mg 100 mL/hr over 60 Minutes Intravenous Every 8 hours 05/15/17 0903 05/25/17 0938   05/14/17 0900  anidulafungin (ERAXIS) 200 mg in sodium chloride 0.9 % 200 mL IVPB     200 mg 78 mL/hr over 200 Minutes Intravenous  Once 05/14/17 0829 05/14/17 1325   05/13/17 1927  vancomycin (VANCOCIN) 1-5 GM/200ML-% IVPB    Comments:  Ward, Christa   : cabinet override      05/13/17 1927 05/14/17 0744   05/12/17 1400  ceFEPIme (MAXIPIME) 1 g in dextrose 5 % 50 mL IVPB     1 g 100 mL/hr over 30 Minutes Intravenous Every 8 hours 05/12/17 0939 05/26/17 2359   05/12/17 0900  vancomycin (VANCOCIN) IVPB 1000 mg/200 mL premix  Status:  Discontinued     1,000 mg 200 mL/hr over 60 Minutes Intravenous Every 12 hours 05/12/17 0750 05/16/17 0852   05/12/17 0830  aztreonam (AZACTAM) 2 GM IVPB     2 g 100 mL/hr over 30 Minutes Intravenous  Once 05/12/17 0800 05/12/17 0957   05/06/17 0916  vancomycin (VANCOCIN) IVPB 1000 mg/200 mL premix     1,000 mg 200 mL/hr over 60 Minutes Intravenous On call to O.R. 05/06/17 0916 05/06/17 1229      Assessment/Plan: Problem List: Patient Active Problem List   Diagnosis Date Noted  . Hypomagnesemia 06/28/2017  . Hypothyroidism   . 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 Protein-calorie malnutrition, severe ([place]) 06/27/2017  . 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 Pressure injury  of skin 05/29/2017  . Ileus ([place])   . Hypokalemia 05/14/2017  . Anastomotic leak of intestine 05/14/2017  . Wound dehiscence, surgical 05/14/2017  .  Aspiration pneumonia ([place]) 05/14/2017  . Chronic narcotic use 05/10/2017  . Generalized anxiety disorder 05/10/2017  . Intra-abdominal adhesions s/p SB resection 05/06/2017 05/09/2017  . Throat symptom 03/18/2016  . Multinodular goiter 01/19/2016  . Hyperthyroidism 01/19/2016  . Essential hypertension   . Diarrhea 11/30/2014  . Anemia, iron deficiency 11/01/2014  . Leukocytosis 11/03/2013  . Tobacco abuse 07/26/2013  . COPD (chronic obstructive pulmonary disease) ([place]) 07/26/2013  . Left knee pain 07/26/2013  . Pain in joint, shoulder region 05/03/2013  . GERD 07/24/2008  . 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    Working with my office to establish a day for surgery later in Sept.   50 Days Post-Op    LOS: 64 days   Matt B. Hassell Done, MD, [facility] [phone] beeper [phone]  07/09/2017 7:31 AM

## 2017-07-10 LAB — BASIC METABOLIC PANEL
ANION GAP: 6 (ref 5–15)
BUN: 26 mg/dL — ABNORMAL HIGH (ref 6–20)
CHLORIDE: 103 mmol/L (ref 101–111)
CO2: 29 mmol/L (ref 22–32)
CREATININE: 0.75 mg/dL (ref 0.61–1.24)
Calcium: 9 mg/dL (ref 8.9–10.3)
GFR calc non Af Amer: >60 mL/min (ref >60)
Glucose, Bld: 110 mg/dL — ABNORMAL HIGH (ref 65–99)
Potassium: 3.7 mmol/L (ref 3.5–5.1)
SODIUM: 138 mmol/L (ref 135–145)

## 2017-07-10 LAB — PHOSPHORUS: Phosphorus: 5.6 mg/dL — ABNORMAL HIGH (ref 2.5–4.6)

## 2017-07-10 LAB — MAGNESIUM: Magnesium: 2 mg/dL (ref 1.7–2.4)

## 2017-07-10 MED ORDER — TRACE MINERALS CR-CU-MN-SE-ZN 10-1000-500-60 MCG/ML IV SOLN
INTRAVENOUS | Status: AC
  Administered 2017-07-10: 17:00:00 via INTRAVENOUS
  Filled 2017-07-10: qty 1000

## 2017-07-10 MED ORDER — MAGNESIUM SULFATE 2 GM/50ML IV SOLN
2.0000 g | Freq: Once | INTRAVENOUS | Status: AC
  Administered 2017-07-10: 2 g via INTRAVENOUS
  Filled 2017-07-10: qty 50

## 2017-07-10 MED ORDER — FAT EMULSION 20 % IV EMUL
240.0000 mL | INTRAVENOUS | Status: AC
  Administered 2017-07-10: 240 mL via INTRAVENOUS
  Filled 2017-07-10: qty 250

## 2017-07-10 MED ORDER — POTASSIUM CHLORIDE 10 MEQ/50ML IV SOLN
10.0000 meq | INTRAVENOUS | Status: AC
  Administered 2017-07-10 (×6): 10 meq via INTRAVENOUS
  Filled 2017-07-10 (×6): qty 50

## 2017-07-10 MED ORDER — INFLUENZA VAC SPLIT QUAD 0.5 ML IM SUSY
0.5000 mL | PREFILLED_SYRINGE | INTRAMUSCULAR | Status: DC
  Filled 2017-07-10: qty 0.5

## 2017-07-10 NOTE — Progress Notes (Signed)
Physical Therapy Treatment Patient Details Name: [name] [name] MRN: [id number] DOB: 09/16/1961 Today's Date: 07/10/2017    History of Present Illness  56 y.o. 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 On 7/18, he developed a leak therefore was taken back to OR for re-do of ex-lap and repair of leak on 05/20/17.  He returned to the ICU on the vent . Extubated on 05/28/17.     PT Comments    Assisted OOB to amb a great distance in hallway.    Follow Up Recommendations  SNF;Supervision/Assistance - 24 hour     Equipment Recommendations  Rolling walker with 5" wheels    Recommendations for Other Services       Precautions / Restrictions Precautions Precautions: Fall Precaution Comments: multiple lines in abdomen, VAC, , and JP drain Restrictions Weight Bearing Restrictions: No    Mobility  Bed Mobility Overal bed mobility: Needs Assistance Bed Mobility: Supine to Sit     Supine to sit: Min guard;Min assist     General bed mobility comments: assist for upper body supine to sit  Transfers Overall transfer level: Needs assistance Equipment used: Rolling walker (2 wheeled) Transfers: Sit to/from Stand Sit to Stand: Supervision;Min guard Stand pivot transfers: Supervision;Min guard       General transfer comment: watched lines, cues for UE placement esp for stand to sit  Ambulation/Gait     Assistive device: Rolling walker (2 wheeled) Gait Pattern/deviations: Step-through pattern;Decreased step length - right;Decreased step length - left;Shuffle;Trunk flexed;Narrow base of support Gait velocity: WFL   General Gait Details: increased c/o ABD pain at end of session.  HR 128.    Stairs            Wheelchair Mobility    Modified Rankin (Stroke Patients Only)       Balance                                             Cognition Arousal/Alertness: Awake/alert Behavior During Therapy: WFL for tasks assessed/performed Overall Cognitive Status: Within Functional Limits for tasks assessed                                        Exercises      General Comments        Pertinent Vitals/Pain Pain Assessment: Faces Faces Pain Scale: Hurts even more Pain Location: ABD after activity Pain Descriptors / Indicators: Cramping Pain Intervention(s): Patient requesting pain meds-RN notified    Home Living                      Prior Function            PT Goals (current goals can now be found in the care plan section) Progress towards PT goals: Progressing toward goals    Frequency    Min 3X/week      PT Plan Current plan remains appropriate    Co-evaluation              AM-PAC PT "6 Clicks" Daily Activity  Outcome Measure  Difficulty turning over in bed (including adjusting bedclothes, sheets  and blankets)?: A Little Difficulty moving from lying on back to sitting on the side of the bed? : A Lot Difficulty sitting down on and standing up from a chair with arms (e.g., wheelchair, bedside commode, etc,.)?: A Lot Help needed moving to and from a bed to chair (including a wheelchair)?: A Little Help needed walking in hospital room?: A Little Help needed climbing 3-5 steps with a railing? : A Lot 6 Click Score: 15    End of Session Equipment Utilized During Treatment: Gait belt Activity Tolerance: Patient tolerated treatment well Patient left: in chair;with call bell/phone within reach;with family/visitor present Nurse Communication: Mobility status;Patient requests pain meds PT Visit Diagnosis: Difficulty in walking, not elsewhere classified (R26.2);Muscle weakness (generalized) (M62.81)     Time: 3557-3220 PT Time Calculation (min) (ACUTE ONLY): 28 min  Charges:  $Gait Training: 8-22  mins $Therapeutic Activity: 8-22 mins                    G Codes:       {Naydelin Ziegler  PTA [facility]  Acute  Rehab Pager      [phone]

## 2017-07-10 NOTE — Progress Notes (Signed)
OT Cancellation Note  Patient Details Name: [name] [name] MRN: [id number] DOB: 01-05-1961   Cancelled Treatment:    Reason Eval/Treat Not Completed: Other (comment).  Pt was sleeping soundly when I checked at 1400. Did not have time to return today. Will check back next week.  [name] [name] 07/10/2017, 3:31 PM  [name], OTR/L [phone] 07/10/2017

## 2017-07-10 NOTE — Consult Note (Signed)
[facility] Nurse wound follow up Reason for Consult:Routine NPWT dressing change. Wound type: Surgical Pressure Injury POA: N/A Measurement: per Monday Wound bed: 80-% red. Moist with healthy granulation tissue in center.  20% with exposed mesh, brown feculent material Drainage (amount, consistency, odor)  Brown in tubing and in cannister.  Canister is changed at this time.  Periwound: More periwound maceration is improving at superior and inferior aspects of wound;  Dressing procedure/placement/frequency: Medical Adhesive remover spray used to remove drape and tape from tubes. Tube care performed and dressings replaced. One piece of white foam and one piece of black foam removed today; wound cleansed with NS. One pice of white foam and one piece of black foam used to fill defect, drape applied and T.R.A.C. Pad applied.  Negative pressure initiated at 172mmHg continuous suction and an immediate seal achieved. Patient was medicated with Fentanyl prior to dressing change and tolerated procedure well. Supplies for Monday's dressing change are requested for delivery to patient's room today. [facility] team will follow and remain available to patient, medical and nursing teams.  Domenic Moras RN BSN [place] Pager [phone]

## 2017-07-10 NOTE — Progress Notes (Signed)
[place] NOTE   Pharmacy Consult for TPN Indication: prolonged ileus, small bowel leak, multiple bowel surgeries  Patient Measurements: Body mass index is 24.93 kg/m. Filed Weights   07/03/17 0450 07/04/17 0414 07/09/17 0500  Weight: 173 lb 11.6 oz (78.8 kg) 175 lb 11.3 oz (79.7 kg) 173 lb 11.6 oz (78.8 kg)   HPI: 43 yoM admitted on 7/11 for enlarging adrenal mass and planned adrenalectomy.  Pharmacy consulted to dose TPN.  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   7/22 OR:  wash out, tube ileostomy, left with open abdomen & wound vac in place.  Plan for OR on 7/25 for closure 7/25 OR: mesh placed for abdominal closure, wound vac 8/2 extubated, propofol drip off 8/4 TPN off for ~ 5 hours due to line detached from filter 8/10 MD request decrease rate x 1 week 2nd low sodium 8/12 Additional sodium added to TPN due to persistent hyponatremia 8/16 Increasing rate back to goal since sodium has improved with above intervention.   8/20 Prealbumin decreased.  New apparent enteric leak per surgery notes. 8/21 CaPhos product elevated, remove electrolytes from TPN 8/25 PICC did not have blood return in 2 of the 3 ports.  Alteplase intra-catheter and function returned.  TPN infusing correctly on 8/26 per RN. 8/28: d/c SSI 8/30: added electrolytes to TPN 8/31: TPN IV line came out around ~0340 and current bag was stopped at this time.  Restart new clinimix 5/15 1L bag (no lytes since phos is elevated this morning) until next bag is due at [facility]. Will not add MVI and trace elements to this 1L  bag. Use electrolyte free clinimix with new bag at [facility] 9/1 NGT out 9/3 added electrolytes to TPN 9/4 lytes out of TPN 9/7 cycle over 18 hrs 9/8 add back electrolytes to TPN and cycle over 14 hours 9/9 lytes out of TPN, continue 14 hr cycle, DC SSI/CBGs 9/13 electrolytes added back to TPN 9/14 remove electrolytes from TPN  Insulin requirements past 24 hours: none (no hx DM)  Current Nutrition: NPO x ice chips, popsicles 9/5  IVF: none. Lasix 20 IV qday  Central access: PICC line ordered 7/19, placed on 7/20 TPN start date: 7/20  ASSESSMENT                                                                                                           Today, 07/10/17  - Glucose (goal <150): at goal -  Electrolytes: K 3.7 (goal > = 4 for ileus), Mag 2.0 ( goal >-2 for ileus), Na 138,  Phos inc 5.6 with electrolytes added to TPN, CorrCa 10.12, Ca/phos product 56.7 = goal is < 55 - Renal:  SCr wnl,  BUN  elevated at 26  but trending down  - LFTs (9/13): WNL except Alk Phos which is elevated at 192, but trending down.    - TGs:   206 (9/3), 227 (9/10) - Prealbumin:  <5 (7/20), 6.1 (7/24), 10.1 (7/30) 24.8 (8/6), 26.4 (8/13), 15.9 (8/20), 19.7 (8/27) 24.7 (9/3) 23.7 (9/10)  -  9/4 pall care dc dilaudid PCA>> fent patch, prn fentanyl, dc thorazine, continued reglan 10 IV q6h, PPI IV BID and pepcid 40 in TPN Started scheduled zofran, zyprexa, prn ativan, scheduled IV diazepam, rec trial of octreotide - 9/4 RN unable to get NGT to insert - 9/6 Nausea much improved after meds adjusted  NUTRITIONAL GOALS                                                                                             RD recs (9/3):  122-138 gms Protein (1.5 - 1.7 g/kg) 2030 - 2270 Kcal  Recalculation: Clinimix 5/15 over 14h cycle with lipids 3x/week on MWF only would provide: 132g protein and avg 2080 kcal/day  - Average glucose infusion rate will be 4.11 mg/kg/min (Maximum 5 mg/kg/min)  PLAN       Now:   Mag  Sulfate 2g IV x 1 dose this afternoon to keep Mag therapeutic since removing electrolytes from TPN  KCl 65mq x 6 runs   At 1800 today: ? Remove electrolytes from TPN since CaxPhos >55.  Continue add Na to provide the equivalent of normal saline (~150 meq/L) with a chloride to acetate ratio of 1:1 ? Clinimix E 5/15 for total of 26461mday to cycle over 14hrs ? Lipids at 2060mhr x 12 hours 3 times weekly on MWF  MVI/TE in TPN and pepcid 40 mg/day in TPN  TPN lab panels on Mondays & Thursdays.  Mag & Phos daily  MicNetta CedarsharmD, BCPS Pager: [phone]/2018 9:12 AM

## 2017-07-10 NOTE — Care Management Note (Signed)
Case Management Note  Patient Details  Name: [name] [name] MRN: [id number] Date of Birth: August 19, 1961  Subjective/Objective:                  Iv tpn, drains and woubndvac  Action/Plan: Date:  July 10, 2017 Chart reviewed for concurrent status and case management needs.  Will continue to follow patient progress.  Discharge Planning: following for needs  Expected discharge date: [id number]  [name], BSN, [place], [place]   Expected Discharge Date:                  Expected Discharge Plan:  Home/Self Care  In-House Referral:     Discharge planning Services  CM Consult  Post Acute Care Choice:    Choice offered to:     DME Arranged:    DME Agency:     HH Arranged:    HH Agency:     Status of Service:  In process, will continue to follow  If discussed at Long Length of Stay Meetings, dates discussed:   [id number] Additional Comments:  Leeroy Cha, RN 07/10/2017, 9:18 AM

## 2017-07-11 LAB — BASIC METABOLIC PANEL
Anion gap: 6 (ref 5–15)
BUN: 29 mg/dL — ABNORMAL HIGH (ref 6–20)
CHLORIDE: 106 mmol/L (ref 101–111)
CO2: 26 mmol/L (ref 22–32)
Calcium: 9.2 mg/dL (ref 8.9–10.3)
Creatinine, Ser: 0.78 mg/dL (ref 0.61–1.24)
GFR calc non Af Amer: >60 mL/min (ref >60)
Glucose, Bld: 113 mg/dL — ABNORMAL HIGH (ref 65–99)
POTASSIUM: 4.1 mmol/L (ref 3.5–5.1)
SODIUM: 138 mmol/L (ref 135–145)

## 2017-07-11 LAB — PHOSPHORUS: Phosphorus: 4.8 mg/dL — ABNORMAL HIGH (ref 2.5–4.6)

## 2017-07-11 LAB — MAGNESIUM: Magnesium: 1.7 mg/dL (ref 1.7–2.4)

## 2017-07-11 MED ORDER — TRACE MINERALS CR-CU-MN-SE-ZN 10-1000-500-60 MCG/ML IV SOLN
INTRAVENOUS | Status: AC
  Administered 2017-07-11: 18:00:00 via INTRAVENOUS
  Filled 2017-07-11: qty 2644
  Filled 2017-07-11: qty 1000

## 2017-07-11 MED ORDER — MAGNESIUM SULFATE 4 GM/100ML IV SOLN
4.0000 g | Freq: Once | INTRAVENOUS | Status: AC
  Administered 2017-07-11: 4 g via INTRAVENOUS
  Filled 2017-07-11: qty 100

## 2017-07-11 NOTE — Progress Notes (Signed)
Emptied 350 ML from wound vac container. New container placed per pt request. Pt's wife requested RN note on chart that fentanyl pain patch does not work for patient for pain control and that the IV fentanyl works better.

## 2017-07-11 NOTE — Progress Notes (Signed)
[place] NOTE   Pharmacy Consult for TPN Indication: prolonged ileus, small bowel leak, multiple bowel surgeries  Patient Measurements: Body mass index is 24.58 kg/m. Filed Weights   07/04/17 0414 07/09/17 0500 07/11/17 0500  Weight: 175 lb 11.3 oz (79.7 kg) 173 lb 11.6 oz (78.8 kg) 171 lb 4.8 oz (77.7 kg)   HPI: 39 yoM admitted on 7/11 for enlarging adrenal mass and planned adrenalectomy.  Pharmacy consulted to dose TPN.  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   7/22 OR:  wash out, tube ileostomy, left with open abdomen & wound vac in place.  Plan for OR on 7/25 for closure 7/25 OR: mesh placed for abdominal closure, wound vac 8/2 extubated, propofol drip off 8/4 TPN off for ~ 5 hours due to line detached from filter 8/10 MD request decrease rate x 1 week 2nd low sodium 8/12 Additional sodium added to TPN due to persistent hyponatremia 8/16 Increasing rate back to goal since sodium has improved with above intervention.   8/20 Prealbumin decreased.  New apparent enteric leak per surgery notes. 8/21 CaPhos product elevated, remove electrolytes from TPN 8/25 PICC did not have blood return in 2 of the 3 ports.  Alteplase intra-catheter and function returned.  TPN infusing correctly on 8/26 per RN. 8/28: d/c SSI 8/30: added electrolytes to TPN 8/31: TPN IV line came out around ~0340 and current bag was stopped at this time.  Restart new clinimix 5/15 1L bag (no lytes since phos is elevated this morning) until next bag is due at [facility]. Will not add MVI and trace elements to this 1L  bag. Use electrolyte free clinimix with new bag at [facility] 9/1 NGT out 9/3 added electrolytes to TPN 9/4 lytes out of TPN 9/7 cycle over 18 hrs 9/8 add back electrolytes to TPN and cycle over 14 hours 9/9 lytes out of TPN, continue 14 hr cycle, DC SSI/CBGs 9/13 electrolytes added back to TPN 9/14 remove electrolytes from TPN  Insulin requirements past 24 hours: none (no hx DM)  Current Nutrition: NPO x ice chips, popsicles 9/5  IVF: none. Lasix 20 IV qday  Central access: PICC line ordered 7/19, placed on 7/20 TPN start date: 7/20  ASSESSMENT                                                                                                           Today, 07/11/17  - Glucose (goal <150): at goal -  Electrolytes: K 4.1 (goal > = 4 for ileus), Mag 1.7 ( goal >-2 for ileus), Na 138,  Phos down 4.8 with electrolytes removed from TPN, CorrCa 10.32, Ca/phos product 49.5 = goal is < 55 - Renal:  SCr wnl,  BUN  elevated at 29 but stable  - LFTs (9/13): WNL except Alk Phos which is elevated at 192, but trending down.    - TGs:   206 (9/3), 227 (9/10) - Prealbumin:  <5 (7/20), 6.1 (7/24), 10.1 (7/30) 24.8 (8/6), 26.4 (8/13), 15.9 (8/20), 19.7 (8/27) 24.7 (9/3) 23.7 (9/10)  -  9/4 pall care dc dilaudid PCA>> fent patch, prn fentanyl, dc thorazine, continued reglan 10 IV q6h, PPI IV BID and pepcid 40 in TPN Started scheduled zofran, zyprexa, prn ativan, scheduled IV diazepam, rec trial of octreotide - 9/4 RN unable to get NGT to insert - 9/6 Nausea much improved after meds adjusted  NUTRITIONAL GOALS                                                                                             RD recs (9/3):  122-138 gms Protein (1.5 - 1.7 g/kg) 2030 - 2270 Kcal  Recalculation: Clinimix 5/15 over 14h cycle with lipids 3x/week on MWF only would provide: 132g protein and avg 2080 kcal/day  - Average glucose infusion rate will be 4.11 mg/kg/min (Maximum 5 mg/kg/min)  PLAN       Now:   Mag Sulfate  4g IV x 1 dose this afternoon to keep Mag therapeutic    At 1800 today: ? Continue electrolyte-free formulation but continue to add Na to provide the equivalent of normal saline (~150 meq/L) with a chloride to acetate ratio of 1:1 ? Clinimix E 5/15 for total of 264m/day to cycle over 14hrs ? Lipids at 258m/hr x 12 hours 3 times weekly on MWF  MVI/TE in TPN and pepcid 40 mg/day in TPN  TPN lab panels on Mondays & Thursdays.  Mag & Phos daily  ErPeggyann JubaPharmD, BCPS Pager: [phone]/15/2018 8:02 AM

## 2017-07-11 NOTE — Progress Notes (Signed)
Patient ID: [name] [name], male   DOB: 10-Mar-1961, 56 y.o.   MRN: [id number] [facility] Progress Note:   51 Days Post-Op   Subjective: Discouraged.  Some mild abdominal pain.  No n/v.  Pt ambulating.    Objective: Vital signs in last 24 hours: Temp:  [97.8 F (36.6 C)-98.5 F (36.9 C)] 98.3 F (36.8 C) (09/15 0834) Pulse Rate:  [97-101] 101 (09/15 0000) Resp:  [20-22] 22 (09/15 0000) BP: (103-134)/(71-76) 117/74 (09/15 0500) SpO2:  [100 %] 100 % (09/15 0000) Weight:  [77.7 kg (171 lb 4.8 oz)] 77.7 kg (171 lb 4.8 oz) (09/15 0500)  Intake/Output from previous day: 09/14 0701 - 09/15 0700 In: 928.5 [I.V.:878.5; IV Piggyback:50] Out: 364 [Urine:450; Drains:315] Intake/Output this shift: Total I/O In: -  Out: 510 [Drains:510]  Physical Exam: Gen:  A&O x 3.   CV RR&R Resp:  Breathing comfortably, good chest wall excursion Abd:  Drain purulent.  Soft, non distended.  Vac in right upper abdomen.  Tubes unchanged.   Ext:  Warm, well perfused.  No edema.    Lab Results:  Results for orders placed or performed during the hospital encounter of 05/06/17 (from the past 48 hour(s))  Magnesium     Status: None   Collection Time: 07/10/17  3:13 AM  Result Value Ref Range   Magnesium 2.0 1.7 - 2.4 mg/dL  Phosphorus     Status: Abnormal   Collection Time: 07/10/17  3:13 AM  Result Value Ref Range   Phosphorus 5.6 (H) 2.5 - 4.6 mg/dL  Basic metabolic panel     Status: Abnormal   Collection Time: 07/10/17  3:13 AM  Result Value Ref Range   Sodium 138 135 - 145 mmol/L   Potassium 3.7 3.5 - 5.1 mmol/L   Chloride 103 101 - 111 mmol/L   CO2 29 22 - 32 mmol/L   Glucose, Bld 110 (H) 65 - 99 mg/dL   BUN 26 (H) 6 - 20 mg/dL   Creatinine, Ser 0.75 0.61 - 1.24 mg/dL   Calcium 9.0 8.9 - 10.3 mg/dL   GFR calc non Af Amer >60 >60 mL/min   GFR calc Af Amer >60 >60 mL/min    Comment: (NOTE) The eGFR has been calculated using the CKD EPI equation. This calculation has not been  validated in all clinical situations. eGFR's persistently <60 mL/min signify possible Chronic Kidney Disease.    Anion gap 6 5 - 15  Magnesium     Status: None   Collection Time: 07/11/17  5:29 AM  Result Value Ref Range   Magnesium 1.7 1.7 - 2.4 mg/dL  Phosphorus     Status: Abnormal   Collection Time: 07/11/17  5:29 AM  Result Value Ref Range   Phosphorus 4.8 (H) 2.5 - 4.6 mg/dL  Basic metabolic panel     Status: Abnormal   Collection Time: 07/11/17  5:29 AM  Result Value Ref Range   Sodium 138 135 - 145 mmol/L   Potassium 4.1 3.5 - 5.1 mmol/L   Chloride 106 101 - 111 mmol/L   CO2 26 22 - 32 mmol/L   Glucose, Bld 113 (H) 65 - 99 mg/dL   BUN 29 (H) 6 - 20 mg/dL   Creatinine, Ser 0.78 0.61 - 1.24 mg/dL   Calcium 9.2 8.9 - 10.3 mg/dL   GFR calc non Af Amer >60 >60 mL/min   GFR calc Af Amer >60 >60 mL/min    Comment: (NOTE) The eGFR has been calculated  using the CKD EPI equation. This calculation has not been validated in all clinical situations. eGFR's persistently <60 mL/min signify possible Chronic Kidney Disease.    Anion gap 6 5 - 15    Radiology/Results: No results found.  Anti-infectives: Anti-infectives    Start     Dose/Rate Route Frequency Ordered Stop   07/08/17 1600  fluconazole (DIFLUCAN) IVPB 100 mg     100 mg 50 mL/hr over 60 Minutes Intravenous Every 24 hours 07/08/17 0828 07/15/17 1559   07/01/17 1500  fluconazole (DIFLUCAN) IVPB 100 mg     100 mg 50 mL/hr over 60 Minutes Intravenous Every 24 hours 07/01/17 1401 07/07/17 1715   06/16/17 1200  piperacillin-tazobactam (ZOSYN) IVPB 3.375 g     3.375 g 12.5 mL/hr over 240 Minutes Intravenous Every 8 hours 06/16/17 1026 06/17/17 0004   06/08/17 1200  piperacillin-tazobactam (ZOSYN) IVPB 3.375 g  Status:  Discontinued     3.375 g 12.5 mL/hr over 240 Minutes Intravenous Every 8 hours 06/08/17 1056 06/16/17 1026   05/29/17 2200  ceFEPIme (MAXIPIME) 2 g in dextrose 5 % 50 mL IVPB  Status:  Discontinued      2 g 100 mL/hr over 30 Minutes Intravenous Every 8 hours 05/29/17 2031 06/03/17 0832   05/29/17 2100  metroNIDAZOLE (FLAGYL) IVPB 500 mg  Status:  Discontinued     500 mg 100 mL/hr over 60 Minutes Intravenous Every 8 hours 05/29/17 2029 06/03/17 0832   05/26/17 2200  ceFAZolin (ANCEF) IVPB 1 g/50 mL premix  Status:  Discontinued     1 g 100 mL/hr over 30 Minutes Intravenous Every 8 hours 05/26/17 1008 05/29/17 2023   05/25/17 1100  vancomycin (VANCOCIN) 1,250 mg in sodium chloride 0.9 % 250 mL IVPB  Status:  Discontinued     1,250 mg 166.7 mL/hr over 90 Minutes Intravenous Every 12 hours 05/25/17 0955 05/26/17 0946   05/22/17 1400  anidulafungin (ERAXIS) 100 mg in sodium chloride 0.9 % 100 mL IVPB     100 mg 78 mL/hr over 100 Minutes Intravenous Every 24 hours 05/21/17 1330 05/27/17 1740   05/21/17 1400  anidulafungin (ERAXIS) 200 mg in sodium chloride 0.9 % 200 mL IVPB     200 mg 78 mL/hr over 200 Minutes Intravenous  Once 05/21/17 1330 05/21/17 1940   05/15/17 1000  anidulafungin (ERAXIS) 100 mg in sodium chloride 0.9 % 100 mL IVPB  Status:  Discontinued     100 mg 78 mL/hr over 100 Minutes Intravenous Every 24 hours 05/14/17 0829 05/16/17 0901   05/15/17 1000  metroNIDAZOLE (FLAGYL) IVPB 500 mg  Status:  Discontinued     500 mg 100 mL/hr over 60 Minutes Intravenous Every 8 hours 05/15/17 0903 05/25/17 0938   05/14/17 0900  anidulafungin (ERAXIS) 200 mg in sodium chloride 0.9 % 200 mL IVPB     200 mg 78 mL/hr over 200 Minutes Intravenous  Once 05/14/17 0829 05/14/17 1325   05/13/17 1927  vancomycin (VANCOCIN) 1-5 GM/200ML-% IVPB    Comments:  Ward, Christa   : cabinet override      05/13/17 1927 05/14/17 0744   05/12/17 1400  ceFEPIme (MAXIPIME) 1 g in dextrose 5 % 50 mL IVPB     1 g 100 mL/hr over 30 Minutes Intravenous Every 8 hours 05/12/17 0939 05/26/17 2359   05/12/17 0900  vancomycin (VANCOCIN) IVPB 1000 mg/200 mL premix  Status:  Discontinued     1,000 mg 200 mL/hr over 60  Minutes Intravenous Every 12  hours 05/12/17 0750 05/16/17 0852   05/12/17 0830  aztreonam (AZACTAM) 2 GM IVPB     2 g 100 mL/hr over 30 Minutes Intravenous  Once 05/12/17 0800 05/12/17 0957   05/06/17 0916  vancomycin (VANCOCIN) IVPB 1000 mg/200 mL premix     1,000 mg 200 mL/hr over 60 Minutes Intravenous On call to O.R. 05/06/17 0916 05/06/17 1229      Assessment/Plan: Problem List: Patient Active Problem List   Diagnosis Date Noted  . Hypomagnesemia 06/28/2017  . Hypothyroidism   . 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 Protein-calorie malnutrition, severe ([place]) 06/27/2017  . 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 Pressure injury of skin 05/29/2017  . Ileus ([place])   . Hypokalemia 05/14/2017  . Anastomotic leak of intestine 05/14/2017  . Wound dehiscence, surgical 05/14/2017  . Aspiration pneumonia ([place]) 05/14/2017  . Chronic narcotic use 05/10/2017  . Generalized anxiety disorder 05/10/2017  . Intra-abdominal adhesions s/p SB resection 05/06/2017 05/09/2017  . Throat symptom 03/18/2016  . Multinodular goiter 01/19/2016  . Hyperthyroidism 01/19/2016  . Essential hypertension   . Diarrhea 11/30/2014  . Anemia, iron deficiency 11/01/2014  . Leukocytosis 11/03/2013  . Tobacco abuse 07/26/2013  . COPD (chronic obstructive pulmonary disease) ([place]) 07/26/2013  . Left knee pain 07/26/2013  . Pain in joint, shoulder region 05/03/2013  . GERD 07/24/2008  . 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 Continue nutritional support with TNA for discontinuous small bowel and EC fistula. VTE prophylaxis.  Dr. Hassell Done has planned laparotomy on October 5th for hopeful fistula takedown and reconnection of small bowel.   51 Days Post-Op    LOS: 66 days   [facility]   07/11/2017 8:37 AM

## 2017-07-12 LAB — PHOSPHORUS: Phosphorus: 5 mg/dL — ABNORMAL HIGH (ref 2.5–4.6)

## 2017-07-12 LAB — BASIC METABOLIC PANEL
ANION GAP: 8 (ref 5–15)
BUN: 30 mg/dL — AB (ref 6–20)
CHLORIDE: 103 mmol/L (ref 101–111)
CO2: 25 mmol/L (ref 22–32)
Calcium: 9.2 mg/dL (ref 8.9–10.3)
Creatinine, Ser: 0.81 mg/dL (ref 0.61–1.24)
GFR calc Af Amer: >60 mL/min (ref >60)
GFR calc non Af Amer: >60 mL/min (ref >60)
Glucose, Bld: 115 mg/dL — ABNORMAL HIGH (ref 65–99)
POTASSIUM: 3.5 mmol/L (ref 3.5–5.1)
SODIUM: 136 mmol/L (ref 135–145)

## 2017-07-12 LAB — MAGNESIUM: Magnesium: 1.5 mg/dL — ABNORMAL LOW (ref 1.7–2.4)

## 2017-07-12 MED ORDER — FENTANYL 12 MCG/HR TD PT72: 112.5000 ug | MEDICATED_PATCH | TRANSDERMAL | Status: DC

## 2017-07-12 MED ORDER — TRACE MINERALS CR-CU-MN-SE-ZN 10-1000-500-60 MCG/ML IV SOLN
INTRAVENOUS | Status: AC
  Administered 2017-07-12: 18:00:00 via INTRAVENOUS
  Filled 2017-07-12: qty 2000
  Filled 2017-07-12: qty 2644

## 2017-07-12 MED ORDER — GI COCKTAIL ~~LOC~~: 30.0000 mL | Freq: Two times a day (BID) | ORAL | Status: DC | PRN

## 2017-07-12 MED ORDER — FENTANYL 25 MCG/HR TD PT72
125.0000 ug | MEDICATED_PATCH | TRANSDERMAL | Status: DC
  Administered 2017-07-12 – 2017-07-30 (×7): 125 ug via TRANSDERMAL
  Filled 2017-07-12 (×8): qty 1

## 2017-07-12 MED ORDER — POTASSIUM CHLORIDE 10 MEQ/100ML IV SOLN
INTRAVENOUS | Status: AC
  Administered 2017-07-12: 10 meq via INTRAVENOUS
  Filled 2017-07-12: qty 100

## 2017-07-12 MED ORDER — POTASSIUM CHLORIDE 10 MEQ/100ML IV SOLN
10.0000 meq | INTRAVENOUS | Status: AC
  Administered 2017-07-12 (×5): 10 meq via INTRAVENOUS
  Filled 2017-07-12 (×2): qty 100

## 2017-07-12 MED ORDER — MAGNESIUM SULFATE 4 GM/100ML IV SOLN
4.0000 g | Freq: Once | INTRAVENOUS | Status: AC
Start: 2017-07-12 — End: 2017-07-12
  Administered 2017-07-12: 4 g via INTRAVENOUS
  Filled 2017-07-12: qty 100

## 2017-07-12 MED ORDER — MAGNESIUM SULFATE 4 GM/100ML IV SOLN
4.0000 g | Freq: Once | INTRAVENOUS | Status: AC
  Administered 2017-07-12: 4 g via INTRAVENOUS
  Filled 2017-07-12: qty 100

## 2017-07-12 NOTE — Progress Notes (Signed)
Patient ID: [name] [name], male   DOB: Sep 02, 1961, 56 y.o.   MRN: [id number] [facility] Surgery Progress Note:   51 Days Post-Op   Subjective: Had an episode of epigastric pain/lower chest pain yesterday.  EKG OK.  Seemed to be very anxious and RN gave PRN ativan which seemed to help a bit.  Denies n/v today.      Objective: Vital signs in last 24 hours: Temp:  [97.6 F (36.4 C)-98.4 F (36.9 C)] 97.6 F (36.4 C) (09/16 0358) Pulse Rate:  [98-110] 102 (09/16 0446) Resp:  [17-23] 20 (09/16 0446) BP: (106-129)/(68-99) 107/76 (09/16 0446) SpO2:  [98 %-100 %] 100 % (09/16 0446) Weight:  [79.3 kg (174 lb 13.2 oz)] 79.3 kg (174 lb 13.2 oz) (09/16 0600)  Intake/Output from previous day: 09/15 0701 - 09/16 0700 In: 2375.9 [I.V.:2215.9; IV Piggyback:150] Out: 2045 [Urine:850; Drains:1195] Intake/Output this shift: No intake/output data recorded.  Physical Exam: Gen:  A&O x 3.   Sleeping, but arousable.   CV RR&R Resp:  Breathing comfortably, good chest wall excursion Abd:  Drain purulent.  Soft, non distended.  Vac in right upper abdomen.  Tubes unchanged.   Ext:  Warm, well perfused.  No edema.    Lab Results:  Results for orders placed or performed during the hospital encounter of 05/06/17 (from the past 48 hour(s))  Magnesium     Status: None   Collection Time: 07/11/17  5:29 AM  Result Value Ref Range   Magnesium 1.7 1.7 - 2.4 mg/dL  Phosphorus     Status: Abnormal   Collection Time: 07/11/17  5:29 AM  Result Value Ref Range   Phosphorus 4.8 (H) 2.5 - 4.6 mg/dL  Basic metabolic panel     Status: Abnormal   Collection Time: 07/11/17  5:29 AM  Result Value Ref Range   Sodium 138 135 - 145 mmol/L   Potassium 4.1 3.5 - 5.1 mmol/L   Chloride 106 101 - 111 mmol/L   CO2 26 22 - 32 mmol/L   Glucose, Bld 113 (H) 65 - 99 mg/dL   BUN 29 (H) 6 - 20 mg/dL   Creatinine, Ser 0.78 0.61 - 1.24 mg/dL   Calcium 9.2 8.9 - 10.3 mg/dL   GFR calc non Af Amer >60 >60 mL/min   GFR calc  Af Amer >60 >60 mL/min    Comment: (NOTE) The eGFR has been calculated using the CKD EPI equation. This calculation has not been validated in all clinical situations. eGFR's persistently <60 mL/min signify possible Chronic Kidney Disease.    Anion gap 6 5 - 15  Magnesium     Status: Abnormal   Collection Time: 07/12/17  5:00 AM  Result Value Ref Range   Magnesium 1.5 (L) 1.7 - 2.4 mg/dL  Phosphorus     Status: Abnormal   Collection Time: 07/12/17  5:00 AM  Result Value Ref Range   Phosphorus 5.0 (H) 2.5 - 4.6 mg/dL  Basic metabolic panel     Status: Abnormal   Collection Time: 07/12/17  5:00 AM  Result Value Ref Range   Sodium 136 135 - 145 mmol/L   Potassium 3.5 3.5 - 5.1 mmol/L   Chloride 103 101 - 111 mmol/L   CO2 25 22 - 32 mmol/L   Glucose, Bld 115 (H) 65 - 99 mg/dL   BUN 30 (H) 6 - 20 mg/dL   Creatinine, Ser 0.81 0.61 - 1.24 mg/dL   Calcium 9.2 8.9 - 10.3 mg/dL   GFR  calc non Af Amer >60 >60 mL/min   GFR calc Af Amer >60 >60 mL/min    Comment: (NOTE) The eGFR has been calculated using the CKD EPI equation. This calculation has not been validated in all clinical situations. eGFR's persistently <60 mL/min signify possible Chronic Kidney Disease.    Anion gap 8 5 - 15    Radiology/Results: No results found.  Anti-infectives: Anti-infectives    Start     Dose/Rate Route Frequency Ordered Stop   07/08/17 1600  fluconazole (DIFLUCAN) IVPB 100 mg     100 mg 50 mL/hr over 60 Minutes Intravenous Every 24 hours 07/08/17 0828 07/15/17 1559   07/01/17 1500  fluconazole (DIFLUCAN) IVPB 100 mg     100 mg 50 mL/hr over 60 Minutes Intravenous Every 24 hours 07/01/17 1401 07/07/17 1715   06/16/17 1200  piperacillin-tazobactam (ZOSYN) IVPB 3.375 g     3.375 g 12.5 mL/hr over 240 Minutes Intravenous Every 8 hours 06/16/17 1026 06/17/17 0004   06/08/17 1200  piperacillin-tazobactam (ZOSYN) IVPB 3.375 g  Status:  Discontinued     3.375 g 12.5 mL/hr over 240 Minutes Intravenous  Every 8 hours 06/08/17 1056 06/16/17 1026   05/29/17 2200  ceFEPIme (MAXIPIME) 2 g in dextrose 5 % 50 mL IVPB  Status:  Discontinued     2 g 100 mL/hr over 30 Minutes Intravenous Every 8 hours 05/29/17 2031 06/03/17 0832   05/29/17 2100  metroNIDAZOLE (FLAGYL) IVPB 500 mg  Status:  Discontinued     500 mg 100 mL/hr over 60 Minutes Intravenous Every 8 hours 05/29/17 2029 06/03/17 0832   05/26/17 2200  ceFAZolin (ANCEF) IVPB 1 g/50 mL premix  Status:  Discontinued     1 g 100 mL/hr over 30 Minutes Intravenous Every 8 hours 05/26/17 1008 05/29/17 2023   05/25/17 1100  vancomycin (VANCOCIN) 1,250 mg in sodium chloride 0.9 % 250 mL IVPB  Status:  Discontinued     1,250 mg 166.7 mL/hr over 90 Minutes Intravenous Every 12 hours 05/25/17 0955 05/26/17 0946   05/22/17 1400  anidulafungin (ERAXIS) 100 mg in sodium chloride 0.9 % 100 mL IVPB     100 mg 78 mL/hr over 100 Minutes Intravenous Every 24 hours 05/21/17 1330 05/27/17 1740   05/21/17 1400  anidulafungin (ERAXIS) 200 mg in sodium chloride 0.9 % 200 mL IVPB     200 mg 78 mL/hr over 200 Minutes Intravenous  Once 05/21/17 1330 05/21/17 1940   05/15/17 1000  anidulafungin (ERAXIS) 100 mg in sodium chloride 0.9 % 100 mL IVPB  Status:  Discontinued     100 mg 78 mL/hr over 100 Minutes Intravenous Every 24 hours 05/14/17 0829 05/16/17 0901   05/15/17 1000  metroNIDAZOLE (FLAGYL) IVPB 500 mg  Status:  Discontinued     500 mg 100 mL/hr over 60 Minutes Intravenous Every 8 hours 05/15/17 0903 05/25/17 0938   05/14/17 0900  anidulafungin (ERAXIS) 200 mg in sodium chloride 0.9 % 200 mL IVPB     200 mg 78 mL/hr over 200 Minutes Intravenous  Once 05/14/17 0829 05/14/17 1325   05/13/17 1927  vancomycin (VANCOCIN) 1-5 GM/200ML-% IVPB    Comments:  Ward, Christa   : cabinet override      05/13/17 1927 05/14/17 0744   05/12/17 1400  ceFEPIme (MAXIPIME) 1 g in dextrose 5 % 50 mL IVPB     1 g 100 mL/hr over 30 Minutes Intravenous Every 8 hours 05/12/17 0939  05/26/17 2359   05/12/17 0900  vancomycin (VANCOCIN) IVPB 1000 mg/200 mL premix  Status:  Discontinued     1,000 mg 200 mL/hr over 60 Minutes Intravenous Every 12 hours 05/12/17 0750 05/16/17 0852   05/12/17 0830  aztreonam (AZACTAM) 2 GM IVPB     2 g 100 mL/hr over 30 Minutes Intravenous  Once 05/12/17 0800 05/12/17 0957   05/06/17 0916  vancomycin (VANCOCIN) IVPB 1000 mg/200 mL premix     1,000 mg 200 mL/hr over 60 Minutes Intravenous On call to O.R. 05/06/17 0916 05/06/17 1229      Assessment/Plan: Problem List: Patient Active Problem List   Diagnosis Date Noted  . Hypomagnesemia 06/28/2017  . Hypothyroidism   . 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 Protein-calorie malnutrition, severe ([place]) 06/27/2017  . 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 Pressure injury of skin 05/29/2017  . Ileus ([place])   . Hypokalemia 05/14/2017  . Anastomotic leak of intestine 05/14/2017  . Wound dehiscence, surgical 05/14/2017  . Aspiration pneumonia ([place]) 05/14/2017  . Chronic narcotic use 05/10/2017  . Generalized anxiety disorder 05/10/2017  . Intra-abdominal adhesions s/p SB resection 05/06/2017 05/09/2017  . Throat symptom 03/18/2016  . Multinodular goiter 01/19/2016  . Hyperthyroidism 01/19/2016  . Essential hypertension   . Diarrhea 11/30/2014  . Anemia, iron deficiency 11/01/2014  . Leukocytosis 11/03/2013  . Tobacco abuse 07/26/2013  . COPD (chronic obstructive pulmonary disease) ([place]) 07/26/2013  . Left knee pain 07/26/2013  . Pain in joint, shoulder region 05/03/2013  . GERD 07/24/2008  . 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 Continue nutritional support with TNA for discontinuous small bowel and EC fistula. VTE prophylaxis.  Dr. Hassell Done has planned laparotomy on October 5th for hopeful fistula takedown and reconnection of small bowel.  Wrote for PRN GI cocktail given upper  abdominal pain.  Already on BID protonix.    [place] days   [facility]   07/12/2017 8:01 AM

## 2017-07-12 NOTE — Progress Notes (Signed)
[place] NOTE   Pharmacy Consult for TPN Indication: prolonged ileus, small bowel leak, multiple bowel surgeries  Patient Measurements: Body mass index is 25.08 kg/m. Filed Weights   07/09/17 0500 07/11/17 0500 07/12/17 0600  Weight: 173 lb 11.6 oz (78.8 kg) 171 lb 4.8 oz (77.7 kg) 174 lb 13.2 oz (79.3 kg)   HPI: [name] [name] admitted on 7/11 for enlarging adrenal mass and planned adrenalectomy.  Pharmacy consulted to dose TPN.  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   7/22 OR:  wash out, tube ileostomy, left with open abdomen & wound vac in place.  Plan for OR on 7/25 for closure 7/25 OR: mesh placed for abdominal closure, wound vac 8/2 extubated, propofol drip off 8/4 TPN off for ~ 5 hours due to line detached from filter 8/10 MD request decrease rate x 1 week 2nd low sodium 8/12 Additional sodium added to TPN due to persistent hyponatremia 8/16 Increasing rate back to goal since sodium has improved with above intervention.   8/20 Prealbumin decreased.  New apparent enteric leak per surgery notes. 8/21 CaPhos product elevated, remove electrolytes from TPN 8/25 PICC did not have blood return in 2 of the 3 ports.  Alteplase intra-catheter and function returned.  TPN infusing correctly on 8/26 per RN. 8/28: d/c SSI 8/30: added electrolytes to TPN 8/31: TPN IV line came out around ~0340 and current bag was stopped at this time.  Restart new clinimix 5/15 1L bag (no lytes since phos is elevated this morning) until next bag is due at [facility]. Will not add MVI and trace elements to this 1L  bag. Use electrolyte free clinimix with new bag at [facility] 9/1 NGT out 9/3 added electrolytes to TPN 9/4 lytes out of TPN 9/7 cycle over 18 hrs 9/8 add back electrolytes to TPN and cycle over 14 hours 9/9 lytes out of TPN, continue 14 hr cycle, DC SSI/CBGs 9/13 electrolytes added back to TPN 9/14 remove electrolytes from TPN  Insulin requirements past 24 hours: none (no hx DM)  Current Nutrition: NPO x ice chips, popsicles 9/5  IVF: none. Lasix 20 IV qday  Central access: PICC line ordered 7/19, placed on 7/20 TPN start date: 7/20  ASSESSMENT                                                                                                           Today, 07/12/17  - Glucose (goal <150): at goal -  Electrolytes: K 3.5 (goal > = 4 for ileus), Mag 1.5 ( goal >-2 for ileus), Na 138,  Phos 5.0 with electrolytes removed from TPN, CorrCa 10.32, Ca/phos product 51.6 = goal is < 55 - Renal:  SCr wnl,  BUN elevated  at 30 but stable  - LFTs (9/13): WNL except Alk Phos which is elevated at 192, but trending down.    - TGs:   206 (9/3), 227 (9/10) - Prealbumin:  <5 (7/20), 6.1 (7/24), 10.1 (7/30) 24.8 (8/6), 26.4 (8/13), 15.9 (8/20), 19.7 (8/27) 24.7 (9/3) 23.7 (9/10)  -  9/4 pall care dc dilaudid PCA>> fent patch, prn fentanyl, dc thorazine, continued reglan 10 IV q6h, PPI IV BID and pepcid 40 in TPN Started scheduled zofran, zyprexa, prn ativan, scheduled IV diazepam, rec trial of octreotide - 9/4 RN unable to get NGT to insert - 9/6 Nausea much improved after meds adjusted  NUTRITIONAL GOALS                                                                                             RD recs (9/3):  122-138 gms Protein (1.5 - 1.7 g/kg) 2030 - 2270 Kcal  Recalculation: Clinimix 5/15 over 14h cycle with lipids 3x/week on MWF only would provide: 132g protein and avg 2080 kcal/day  - Average glucose infusion rate will be 4.11 mg/kg/min (Maximum 5 mg/kg/min)  PLAN       Now:   Mag Sulfate 4g IV  x 2 doses   Potassium chloride 1mq x 5 runs    At 1800 today: ? Continue electrolyte-free formulation but continue to add Na to provide the equivalent of normal saline (~150 meq/L) with a chloride to acetate ratio of 1:1 ? Clinimix E 5/15 for total of 26488mday to cycle over 14hrs ? Lipids at 2066mhr x 12 hours 3 times weekly on MWF  MVI/TE in TPN and pepcid 40 mg/day in TPN  TPN lab panels on Mondays & Thursdays.  Mag & Phos daily  EriPeggyann JubaharmD, BCPS Pager: [phone] 7:05 AM

## 2017-07-12 NOTE — Progress Notes (Signed)
Pt walked 800 feet with 2 assist and walker.

## 2017-07-13 LAB — COMPREHENSIVE METABOLIC PANEL
ALT: 29 U/L (ref 17–63)
AST: 24 U/L (ref 15–41)
Albumin: 2.5 g/dL — ABNORMAL LOW (ref 3.5–5.0)
Alkaline Phosphatase: 162 U/L — ABNORMAL HIGH (ref 38–126)
Anion gap: 8 (ref 5–15)
BUN: 30 mg/dL — AB (ref 6–20)
CHLORIDE: 102 mmol/L (ref 101–111)
CO2: 24 mmol/L (ref 22–32)
CREATININE: 0.78 mg/dL (ref 0.61–1.24)
Calcium: 9 mg/dL (ref 8.9–10.3)
GFR calc Af Amer: >60 mL/min (ref >60)
GFR calc non Af Amer: >60 mL/min (ref >60)
GLUCOSE: 109 mg/dL — AB (ref 65–99)
POTASSIUM: 3.8 mmol/L (ref 3.5–5.1)
SODIUM: 134 mmol/L — AB (ref 135–145)
Total Bilirubin: 0.6 mg/dL (ref 0.3–1.2)
Total Protein: 6.4 g/dL — ABNORMAL LOW (ref 6.5–8.1)

## 2017-07-13 LAB — CBC
HCT: 28.2 % — ABNORMAL LOW (ref 39.0–52.0)
HEMOGLOBIN: 9 g/dL — AB (ref 13.0–17.0)
MCH: 24.4 pg — ABNORMAL LOW (ref 26.0–34.0)
MCHC: 31.9 g/dL (ref 30.0–36.0)
MCV: 76.4 fL — ABNORMAL LOW (ref 78.0–100.0)
PLATELETS: 462 10*3/uL — AB (ref 150–400)
RBC: 3.69 MIL/uL — AB (ref 4.22–5.81)
RDW: 17.1 % — ABNORMAL HIGH (ref 11.5–15.5)
WBC: 9.9 10*3/uL (ref 4.0–10.5)

## 2017-07-13 LAB — DIFFERENTIAL
BASOS ABS: 0 10*3/uL (ref 0.0–0.1)
BASOS PCT: 0 %
EOS ABS: 0.5 10*3/uL (ref 0.0–0.7)
Eosinophils Relative: 5 %
Lymphocytes Relative: 37 %
Lymphs Abs: 3.7 10*3/uL (ref 0.7–4.0)
MONOS PCT: 9 %
Monocytes Absolute: 0.9 10*3/uL (ref 0.1–1.0)
NEUTROS ABS: 4.8 10*3/uL (ref 1.7–7.7)
Neutrophils Relative %: 49 %

## 2017-07-13 LAB — PHOSPHORUS: Phosphorus: 5.3 mg/dL — ABNORMAL HIGH (ref 2.5–4.6)

## 2017-07-13 LAB — TRIGLYCERIDES: Triglycerides: 195 mg/dL — ABNORMAL HIGH (ref <150)

## 2017-07-13 LAB — PREALBUMIN: PREALBUMIN: 18.5 mg/dL (ref 18–38)

## 2017-07-13 LAB — MAGNESIUM: MAGNESIUM: 1.7 mg/dL (ref 1.7–2.4)

## 2017-07-13 MED ORDER — MAGNESIUM SULFATE 4 GM/100ML IV SOLN
4.0000 g | Freq: Once | INTRAVENOUS | Status: AC
  Administered 2017-07-13: 4 g via INTRAVENOUS
  Filled 2017-07-13: qty 100

## 2017-07-13 MED ORDER — MAGNESIUM SULFATE 2 GM/50ML IV SOLN
2.0000 g | Freq: Once | INTRAVENOUS | Status: AC
  Administered 2017-07-13: 2 g via INTRAVENOUS
  Filled 2017-07-13: qty 50

## 2017-07-13 MED ORDER — MAGIC MOUTHWASH
5.0000 mL | Freq: Three times a day (TID) | ORAL | Status: DC
  Administered 2017-07-13 – 2017-08-10 (×65): 5 mL via ORAL
  Filled 2017-07-13 (×60): qty 5

## 2017-07-13 MED ORDER — FAT EMULSION 20 % IV EMUL
240.0000 mL | INTRAVENOUS | Status: AC
  Administered 2017-07-13: 240 mL via INTRAVENOUS
  Filled 2017-07-13: qty 250

## 2017-07-13 MED ORDER — TRACE MINERALS CR-CU-MN-SE-ZN 10-1000-500-60 MCG/ML IV SOLN
INTRAVENOUS | Status: AC
  Administered 2017-07-13: 18:00:00 via INTRAVENOUS
  Filled 2017-07-13: qty 1000
  Filled 2017-07-13: qty 2644

## 2017-07-13 MED ORDER — POTASSIUM CHLORIDE 10 MEQ/50ML IV SOLN
10.0000 meq | INTRAVENOUS | Status: AC
  Administered 2017-07-13 (×4): 10 meq via INTRAVENOUS
  Filled 2017-07-13 (×4): qty 50

## 2017-07-13 NOTE — Progress Notes (Signed)
[place] NOTE   Pharmacy Consult for TPN Indication: prolonged ileus, small bowel leak, multiple bowel surgeries  Patient Measurements: Body mass index is 24.96 kg/m. Filed Weights   07/11/17 0500 07/12/17 0600 07/13/17 0600  Weight: 171 lb 4.8 oz (77.7 kg) 174 lb 13.2 oz (79.3 kg) 173 lb 15.1 oz (78.9 kg)   HPI: 62 yoM admitted on 7/11 for enlarging adrenal mass and planned adrenalectomy.  Pharmacy consulted to dose TPN.  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   7/22 OR:  wash out, tube ileostomy, left with open abdomen & wound vac in place.  Plan for OR on 7/25 for closure 7/25 OR: mesh placed for abdominal closure, wound vac 8/2 extubated, propofol drip off 8/4 TPN off for ~ 5 hours due to line detached from filter 8/10 MD request decrease rate x 1 week 2nd low sodium 8/12 Additional sodium added to TPN due to persistent hyponatremia 8/16 Increasing rate back to goal since sodium has improved with above intervention.   8/20 Prealbumin decreased.  New apparent enteric leak per surgery notes. 8/21 CaPhos product elevated, remove electrolytes from TPN 8/25 PICC did not have blood return in 2 of the 3 ports.  Alteplase intra-catheter and function returned.  TPN infusing correctly on 8/26 per RN. 8/28: d/c SSI 8/30: added electrolytes to TPN 8/31: TPN IV line came out around ~0340 and current bag was stopped at this time.  Restart new clinimix 5/15 1L bag (no lytes since phos is elevated this morning) until next bag is due at [facility]. Will not add MVI and trace elements to this 1L  bag. Use electrolyte free clinimix with new bag at [facility] 9/1 NGT out 9/3 added electrolytes to TPN 9/4 lytes out of TPN 9/7 cycle over 18 hrs 9/8 add back electrolytes to TPN and cycle over 14 hours 9/9 lytes out of TPN, continue 14 hr cycle, DC SSI/CBGs 9/13 electrolytes added back to TPN 9/14 remove electrolytes from TPN  Insulin requirements past 24 hours: none (no hx DM)  Current Nutrition: NPO x ice chips, popsicles 9/5  IVF: none. Lasix 20 IV qday  Central access: PICC line ordered 7/19, placed on 7/20 TPN start date: 7/20  ASSESSMENT                                                                                                           Today, 07/13/17  - Glucose (goal <150): at goal -  Electrolytes: K 3.8 (goal > = 4 for ileus), Mag 1.7 ( goal >-2 for ileus), Na 134-trending back down so will increase amt in TPN slightly,  Phos 5.3 with electrolytes removed from TPN, CorrCa 10.2, Ca/phos product 54 = goal is <  55 - Renal:  SCr wnl,  BUN elevated at 30 but stable  - LFTs (9/13): WNL except Alk Phos which is elevated at 162, but trending down.    - TGs:   206 (9/3), 227 (9/10), IP (9/17) - Prealbumin:  <5 (7/20), 6.1 (7/24), 10.1 (7/30) 24.8 (8/6), 26.4 (8/13), 15.9 (8/20), 19.7 (8/27) 24.7 (9/3) 23.7 (9/10) IP(9/17)  NUTRITIONAL GOALS                                                                                             RD recs (9/3):  122-138 gms Protein (1.5 - 1.7 g/kg) 2030 - 2270 Kcal  Recalculation: Clinimix 5/15 over 14h cycle with lipids 3x/week on MWF only would provide: 132g protein and avg 2080 kcal/day  - Average glucose infusion rate will be 4.11 mg/kg/min (Maximum 5 mg/kg/min)  PLAN      Now:   Mag Sulfate 6g IV today  Potassium chloride 81mq x 4 runs    At 1800 today: ? Continue electrolyte-free formulation but continue to add Na to provide the equivalent of normal saline (~150 meq/L) with a chloride to acetate ratio of 1:1 ? Clinimix E 5/15 for  total of 26458mday to cycle over 14hrs ? Lipids at 2036mhr x 12 hours 3 times weekly on MWF  MVI/TE in TPN and pepcid 40 mg/day in TPN  TPN lab panels on Mondays & Thursdays.  Mag & Phos daily  MicNetta CedarsharmD, BCPS Pager: [phone] 8:49 AM

## 2017-07-13 NOTE — Progress Notes (Signed)
   07/13/17 1400  Clinical Encounter Type  Visited With Patient and family together  Visit Type Follow-up  Spiritual Encounters  Spiritual Needs Emotional  Stress Factors  Patient Stress Factors Other (Comment) (Long time hospitalization)  Family Stress Factors None identified  Visited with patient and his wife and his sister.  He was able to be in a chair and is getting ready to go for a walk.  Indicates he will have surgery in early October and then hopefully a plan to go home.  He certainly misses the routine of home, but is taking one day at a time.  Will follow up with him. Chaplain Katherene Ponto

## 2017-07-13 NOTE — Progress Notes (Signed)
Patient ID: [name] [name], male   DOB: 1961-01-06, 56 y.o.   MRN: [id number] [facility] Surgery Progress Note:   54 Days Post-Op  Subjective: Mental status is clear.  Up walking in the halls-made 2 laps Objective: Vital signs in last 24 hours: Temp:  [97.4 F (36.3 C)-98.4 F (36.9 C)] 98.3 F (36.8 C) (09/17 0800) Pulse Rate:  [89-99] 97 (09/17 0900) Resp:  [16-21] 19 (09/17 0900) BP: (88-106)/(51-80) 106/69 (09/17 0900) SpO2:  [98 %-100 %] 98 % (09/17 0900) Weight:  [78.9 kg (173 lb 15.1 oz)] 78.9 kg (173 lb 15.1 oz) (09/17 0600)  Intake/Output from previous day: 09/16 0701 - 09/17 0700 In: 2182.1 [I.V.:2018.1; IV Piggyback:154] Out: 1930 [Urine:1225; Drains:705] Intake/Output this shift: Total I/O In: -  Out: 450 [Urine:200; Drains:250]  Physical Exam: Work of breathing is not labored.  Drains in place.    Lab Results:  Results for orders placed or performed during the hospital encounter of 05/06/17 (from the past 48 hour(s))  Magnesium     Status: Abnormal   Collection Time: 07/12/17  5:00 AM  Result Value Ref Range   Magnesium 1.5 (L) 1.7 - 2.4 mg/dL  Phosphorus     Status: Abnormal   Collection Time: 07/12/17  5:00 AM  Result Value Ref Range   Phosphorus 5.0 (H) 2.5 - 4.6 mg/dL  Basic metabolic panel     Status: Abnormal   Collection Time: 07/12/17  5:00 AM  Result Value Ref Range   Sodium 136 135 - 145 mmol/L   Potassium 3.5 3.5 - 5.1 mmol/L   Chloride 103 101 - 111 mmol/L   CO2 25 22 - 32 mmol/L   Glucose, Bld 115 (H) 65 - 99 mg/dL   BUN 30 (H) 6 - 20 mg/dL   Creatinine, Ser 0.81 0.61 - 1.24 mg/dL   Calcium 9.2 8.9 - 10.3 mg/dL   GFR calc non Af Amer >60 >60 mL/min   GFR calc Af Amer >60 >60 mL/min    Comment: (NOTE) The eGFR has been calculated using the CKD EPI equation. This calculation has not been validated in all clinical situations. eGFR's persistently <60 mL/min signify possible Chronic Kidney Disease.    Anion gap 8 5 - 15  Magnesium      Status: None   Collection Time: 07/13/17  3:51 AM  Result Value Ref Range   Magnesium 1.7 1.7 - 2.4 mg/dL  Phosphorus     Status: Abnormal   Collection Time: 07/13/17  3:51 AM  Result Value Ref Range   Phosphorus 5.3 (H) 2.5 - 4.6 mg/dL  Differential     Status: None   Collection Time: 07/13/17  3:51 AM  Result Value Ref Range   Neutrophils Relative % 49 %   Neutro Abs 4.8 1.7 - 7.7 K/uL   Lymphocytes Relative 37 %   Lymphs Abs 3.7 0.7 - 4.0 K/uL   Monocytes Relative 9 %   Monocytes Absolute 0.9 0.1 - 1.0 K/uL   Eosinophils Relative 5 %   Eosinophils Absolute 0.5 0.0 - 0.7 K/uL   Basophils Relative 0 %   Basophils Absolute 0.0 0.0 - 0.1 K/uL  Comprehensive metabolic panel     Status: Abnormal   Collection Time: 07/13/17  3:51 AM  Result Value Ref Range   Sodium 134 (L) 135 - 145 mmol/L   Potassium 3.8 3.5 - 5.1 mmol/L   Chloride 102 101 - 111 mmol/L   CO2 24 22 - 32 mmol/L  Glucose, Bld 109 (H) 65 - 99 mg/dL   BUN 30 (H) 6 - 20 mg/dL   Creatinine, Ser 0.78 0.61 - 1.24 mg/dL   Calcium 9.0 8.9 - 10.3 mg/dL   Total Protein 6.4 (L) 6.5 - 8.1 g/dL   Albumin 2.5 (L) 3.5 - 5.0 g/dL   AST 24 15 - 41 U/L   ALT 29 17 - 63 U/L   Alkaline Phosphatase 162 (H) 38 - 126 U/L   Total Bilirubin 0.6 0.3 - 1.2 mg/dL   GFR calc non Af Amer >60 >60 mL/min   GFR calc Af Amer >60 >60 mL/min    Comment: (NOTE) The eGFR has been calculated using the CKD EPI equation. This calculation has not been validated in all clinical situations. eGFR's persistently <60 mL/min signify possible Chronic Kidney Disease.    Anion gap 8 5 - 15  CBC     Status: Abnormal   Collection Time: 07/13/17  3:51 AM  Result Value Ref Range   WBC 9.9 4.0 - 10.5 K/uL   RBC 3.69 (L) 4.22 - 5.81 MIL/uL   Hemoglobin 9.0 (L) 13.0 - 17.0 g/dL   HCT 28.2 (L) 39.0 - 52.0 %   MCV 76.4 (L) 78.0 - 100.0 fL   MCH 24.4 (L) 26.0 - 34.0 pg   MCHC 31.9 30.0 - 36.0 g/dL   RDW 17.1 (H) 11.5 - 15.5 %   Platelets 462 (H) 150 - 400  K/uL    Radiology/Results: No results found.  Anti-infectives: Anti-infectives    Start     Dose/Rate Route Frequency Ordered Stop   07/08/17 1600  fluconazole (DIFLUCAN) IVPB 100 mg     100 mg 50 mL/hr over 60 Minutes Intravenous Every 24 hours 07/08/17 0828 07/15/17 1559   07/01/17 1500  fluconazole (DIFLUCAN) IVPB 100 mg     100 mg 50 mL/hr over 60 Minutes Intravenous Every 24 hours 07/01/17 1401 07/07/17 1715   06/16/17 1200  piperacillin-tazobactam (ZOSYN) IVPB 3.375 g     3.375 g 12.5 mL/hr over 240 Minutes Intravenous Every 8 hours 06/16/17 1026 06/17/17 0004   06/08/17 1200  piperacillin-tazobactam (ZOSYN) IVPB 3.375 g  Status:  Discontinued     3.375 g 12.5 mL/hr over 240 Minutes Intravenous Every 8 hours 06/08/17 1056 06/16/17 1026   05/29/17 2200  ceFEPIme (MAXIPIME) 2 g in dextrose 5 % 50 mL IVPB  Status:  Discontinued     2 g 100 mL/hr over 30 Minutes Intravenous Every 8 hours 05/29/17 2031 06/03/17 0832   05/29/17 2100  metroNIDAZOLE (FLAGYL) IVPB 500 mg  Status:  Discontinued     500 mg 100 mL/hr over 60 Minutes Intravenous Every 8 hours 05/29/17 2029 06/03/17 0832   05/26/17 2200  ceFAZolin (ANCEF) IVPB 1 g/50 mL premix  Status:  Discontinued     1 g 100 mL/hr over 30 Minutes Intravenous Every 8 hours 05/26/17 1008 05/29/17 2023   05/25/17 1100  vancomycin (VANCOCIN) 1,250 mg in sodium chloride 0.9 % 250 mL IVPB  Status:  Discontinued     1,250 mg 166.7 mL/hr over 90 Minutes Intravenous Every 12 hours 05/25/17 0955 05/26/17 0946   05/22/17 1400  anidulafungin (ERAXIS) 100 mg in sodium chloride 0.9 % 100 mL IVPB     100 mg 78 mL/hr over 100 Minutes Intravenous Every 24 hours 05/21/17 1330 05/27/17 1740   05/21/17 1400  anidulafungin (ERAXIS) 200 mg in sodium chloride 0.9 % 200 mL IVPB     200 mg  78 mL/hr over 200 Minutes Intravenous  Once 05/21/17 1330 05/21/17 1940   05/15/17 1000  anidulafungin (ERAXIS) 100 mg in sodium chloride 0.9 % 100 mL IVPB  Status:   Discontinued     100 mg 78 mL/hr over 100 Minutes Intravenous Every 24 hours 05/14/17 0829 05/16/17 0901   05/15/17 1000  metroNIDAZOLE (FLAGYL) IVPB 500 mg  Status:  Discontinued     500 mg 100 mL/hr over 60 Minutes Intravenous Every 8 hours 05/15/17 0903 05/25/17 0938   05/14/17 0900  anidulafungin (ERAXIS) 200 mg in sodium chloride 0.9 % 200 mL IVPB     200 mg 78 mL/hr over 200 Minutes Intravenous  Once 05/14/17 0829 05/14/17 1325   05/13/17 1927  vancomycin (VANCOCIN) 1-5 GM/200ML-% IVPB    Comments:  Ward, Christa   : cabinet override      05/13/17 1927 05/14/17 0744   05/12/17 1400  ceFEPIme (MAXIPIME) 1 g in dextrose 5 % 50 mL IVPB     1 g 100 mL/hr over 30 Minutes Intravenous Every 8 hours 05/12/17 0939 05/26/17 2359   05/12/17 0900  vancomycin (VANCOCIN) IVPB 1000 mg/200 mL premix  Status:  Discontinued     1,000 mg 200 mL/hr over 60 Minutes Intravenous Every 12 hours 05/12/17 0750 05/16/17 0852   05/12/17 0830  aztreonam (AZACTAM) 2 GM IVPB     2 g 100 mL/hr over 30 Minutes Intravenous  Once 05/12/17 0800 05/12/17 0957   05/06/17 0916  vancomycin (VANCOCIN) IVPB 1000 mg/200 mL premix     1,000 mg 200 mL/hr over 60 Minutes Intravenous On call to O.R. 05/06/17 0916 05/06/17 1229      Assessment/Plan: Problem List: Patient Active Problem List   Diagnosis Date Noted  . Hypomagnesemia 06/28/2017  . Hypothyroidism   . 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 Protein-calorie malnutrition, severe ([place]) 06/27/2017  . 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 Pressure injury of skin 05/29/2017  . Ileus ([place])   . Hypokalemia 05/14/2017  . Anastomotic leak of intestine 05/14/2017  . Wound dehiscence, surgical 05/14/2017  . Aspiration pneumonia ([place]) 05/14/2017  . Chronic narcotic use 05/10/2017  . Generalized anxiety disorder 05/10/2017  . Intra-abdominal adhesions s/p SB resection 05/06/2017 05/09/2017  . Throat symptom 03/18/2016  .  Multinodular goiter 01/19/2016  . Hyperthyroidism 01/19/2016  . Essential hypertension   . Diarrhea 11/30/2014  . Anemia, iron deficiency 11/01/2014  . Leukocytosis 11/03/2013  . Tobacco abuse 07/26/2013  . COPD (chronic obstructive pulmonary disease) ([place]) 07/26/2013  . Left knee pain 07/26/2013  . Pain in joint, shoulder region 05/03/2013  . GERD 07/24/2008  . 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   54 Days Post-Op    LOS: 68 days   Matt B. Hassell Done, MD, [facility] [phone] beeper [phone]  07/13/2017 10:25 AM

## 2017-07-13 NOTE — Consult Note (Signed)
[place] Nurse wound follow up Wound type:Routine change of NPWT.  [facility] PA Will Creig Hines present for assessment. Measurement: 5cm x 17cm x 1.5 cm (Contraction noted at 3 and 9 o'clock.) Wound bed: 70% red, granulating and contracting, 30% with mesh and brown feculent material collecting and presenting through. Drainage (amount, consistency, odor) Brown, feculent, sour odor Periwound:Periwound maceration from 3-7 o'clock and from 10-3 o'clock measuring 2cm. Dressing procedure/placement/frequency: NPWT wound dressing removed (1 piece each of black and white foam) with aerosol medical adhesive remover, wound and periwound tissue cleansed and patted dry.  Periwound covered with strips of V.A.C. Drape. One piece of white foam used to cover 80% of wound bed, 1 piece of black foam used to cover white foam and rest of wound bed. Foam is secured with drape, dressing is attached to negative pressure at 133mmHg continuous.  An immediate seal is achieved. Patient was premedicated with fentanyl IV and tolerated procedure well. The cannister was changed.  Supplies are ordered for Wednesday's dressing change. [place] nursing team will follow, and will remain available to this patient, the nursing, surgical and medical teams.   Thanks, Maudie Flakes, MSN, RN, [place], Arther Abbott  Pager# [phone]

## 2017-07-13 NOTE — Progress Notes (Signed)
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  Taking Fentanyl pretty frequently via nurse bolus Valium 5 mg yesterday x1 Fentanyl patch 100 mcg Fentanyl bolus 75 mcg x 5 yesterday Lidocaine patch to neck Ativan 1 mg IV x 3 yesterday Reglan 10 mg x 3 yesterday Nicoderm 14 qd trazodone 50 HS   Objective: Vital signs in last 24 hours: Temp:  [97.4 F (36.3 C)-98.4 F (36.9 C)] 98.4 F (36.9 C) (09/17 0359) Pulse Rate:  [89-101] 99 (09/17 0400) Resp:  [16-22] 16 (09/17 0400) BP: (88-105)/(51-80) 100/62 (09/17 0400) SpO2:  [98 %-100 %] 98 % (09/17 0400) Weight:  [78.9 kg (173 lb 15.1 oz)] 78.9 kg (173 lb 15.1 oz) (09/17 0600) Last BM Date: 07/12/17 IV 2182 Urine 1225 Drain 705 Afebrile, VSS, drops to 80's occasionally CMP OK CBC is stable CT 06/29/17 - No new acute chages Intake/Output from previous day: 09/16 0701 - 09/17 0700 In: 2182.1 [I.V.:2018.1; IV Piggyback:154] Out: 1930 [Urine:1225; Drains:705] Intake/Output this shift: No intake/output data recorded.  General appearance: alert, cooperative and no distress Resp: clear to auscultation bilaterally and down in the bases some GI: No edema Extremities: soft, not distended, wound vac in place, ileostomy tubes draining, NO BS, No BM recorded  Lab Results:   Recent Labs  07/13/17 0351  WBC 9.9  HGB 9.0*  HCT 28.2*  PLT 462*    BMET  Recent Labs  07/12/17 0500 07/13/17 0351  NA 136 134*  K 3.5 3.8  CL 103 102  CO2 25 24  GLUCOSE 115* 109*  BUN 30* 30*  CREATININE 0.81 0.78  CALCIUM 9.2 9.0   PT/INR No results for input(s): LABPROT, INR in the last 72 hours.   Recent Labs Lab 07/06/17 1255 07/09/17 0722 07/13/17 0351  AST 32 26 24  ALT 55 38 29  ALKPHOS 199* 192* 162*  BILITOT 1.0 0.4 0.6  PROT 7.1 6.9 6.4*  ALBUMIN 2.7* 2.6* 2.5*     Lipase     Component Value Date/Time   LIPASE 27 09/15/2015 0934     Prior to Admission  medications   Medication Sig Start Date End Date Taking? Authorizing Provider  acetaminophen (TYLENOL) 500 MG tablet Take 1,500 mg by mouth daily as needed for mild pain.    Yes [provider]  atorvastatin (LIPITOR) 10 MG tablet Take 10 mg by mouth daily.   Yes [provider]  chlorthalidone (HYGROTON) 25 MG tablet Take 12.5 mg by mouth daily.  02/11/17  Yes [provider]  cloNIDine (CATAPRES) 0.1 MG tablet Take 0.1 mg by mouth 2 (two) times daily. 04/08/17  Yes [provider]  gabapentin (NEURONTIN) 100 MG capsule Take 100 mg by mouth 2 (two) times daily as needed (nerve pain).  11/14/14  Yes [provider]  ibuprofen (ADVIL,MOTRIN) 200 MG tablet Take 600 mg by mouth daily as needed for headache. Reported on 03/18/2016   Yes [provider]  LORazepam (ATIVAN) 1 MG tablet Take 1 mg by mouth 2 (two) times daily as needed for anxiety.   Yes [provider]  methocarbamol (ROBAXIN) 500 MG tablet Take 1 tablet (500 mg total) by mouth 2 (two) times daily. Patient taking differently: Take 500 mg by mouth daily as needed for muscle spasms.  10/05/15  Yes Larene Pickett, PA-C  Multiple Vitamin (MULTIVITAMIN WITH MINERALS) TABS Take 1 tablet by mouth daily.   Yes [provider]  omeprazole (PRILOSEC) 40 MG capsule Take 1 capsule (40 mg total) by mouth 2 (two) times daily. Patient taking differently: Take 40 mg by mouth daily.  07/22/13  Yes Theodis Blaze, MD  oxyCODONE-acetaminophen (PERCOCET) 10-325 MG per tablet Take 1-2 tablets by mouth every 6 (six) hours as needed for pain. 08/25/13  Yes Katheren Shams, MD  Potassium Chloride ER 20 MEQ TBCR Take 20 mEq by mouth daily. 02/18/17  Yes [provider]  sildenafil (VIAGRA) 100 MG tablet Take 100 mg by mouth daily as needed for erectile dysfunction.   Yes [provider]  tadalafil (CIALIS) 20 MG tablet Take 20 mg by mouth daily as needed for erectile dysfunction.    Yes [provider]  valsartan (DIOVAN) 80 MG tablet Take 80 mg by mouth daily. 03/03/17  Yes [provider]  labetalol (NORMODYNE) 100 MG tablet Take 0.5 tablets (50 mg total) by mouth 2 (two) times daily. Patient not taking: Reported on 04/28/2017 03/28/17   Renato Shin, MD    Medications: . Chlorhexidine Gluconate Cloth  6 each Topical Q1200  . diazepam  5 mg Intravenous q12n4p  . enoxaparin (LOVENOX) injection  40 mg Subcutaneous Q24H  . fentaNYL  125 mcg Transdermal Q72H  . furosemide  20 mg Intravenous Daily  . Influenza vac split quadrivalent PF  0.5 mL Intramuscular Tomorrow-1000  . lidocaine  2 patch Transdermal Q24H  . lip balm  1 application Topical BID  . mouth rinse  15 mL Mouth Rinse q12n4p  . metoCLOPramide (REGLAN) injection  10 mg Intravenous Q8H  . nicotine  14 mg Transdermal Daily  . OLANZapine zydis  5 mg Oral QHS  . ondansetron (ZOFRAN) IV  4 mg Intravenous Q8H  . pantoprazole (PROTONIX) IV  40 mg Intravenous BID  . traZODone  50 mg Oral QHS   . fluconazole (DIFLUCAN) IV Stopped (07/12/17 1738)  . ondansetron [facility]) IV 8 mg (07/11/17 1839)   . fluconazole (DIFLUCAN) IV Stopped (07/12/17 1738)  . ondansetron (ZOFRAN) IV 8 mg (07/11/17 1839)   Anti-infectives    Start     Dose/Rate Route Frequency Ordered Stop   07/08/17 1600  fluconazole (DIFLUCAN) IVPB 100 mg     100 mg 50 mL/hr over 60 Minutes Intravenous Every 24 hours 07/08/17 0828 07/15/17 1559   07/01/17 1500  fluconazole (DIFLUCAN) IVPB 100 mg     100 mg 50 mL/hr over 60 Minutes Intravenous Every 24 hours 07/01/17 1401 07/07/17 1715   06/16/17 1200  piperacillin-tazobactam (ZOSYN) IVPB 3.375 g     3.375 g 12.5 mL/hr over 240 Minutes Intravenous Every 8 hours 06/16/17 1026 06/17/17 0004   06/08/17 1200  piperacillin-tazobactam (ZOSYN) IVPB 3.375 g  Status:  Discontinued     3.375 g 12.5 mL/hr over 240 Minutes Intravenous Every 8 hours 06/08/17 1056 06/16/17 1026   05/29/17 2200   ceFEPIme (MAXIPIME) 2 g in dextrose 5 % 50 mL IVPB  Status:  Discontinued     2 g 100 mL/hr over 30 Minutes Intravenous Every 8 hours 05/29/17 2031 06/03/17 0832   05/29/17 2100  metroNIDAZOLE (FLAGYL) IVPB 500 mg  Status:  Discontinued     500 mg 100 mL/hr over 60 Minutes Intravenous Every 8 hours 05/29/17 2029 06/03/17 0832   05/26/17 2200  ceFAZolin (ANCEF) IVPB 1 g/50 mL premix  Status:  Discontinued     1 g 100 mL/hr over 30 Minutes Intravenous Every 8 hours 05/26/17 1008 05/29/17 2023  05/25/17 1100  vancomycin (VANCOCIN) 1,250 mg in sodium chloride 0.9 % 250 mL IVPB  Status:  Discontinued     1,250 mg 166.7 mL/hr over 90 Minutes Intravenous Every 12 hours 05/25/17 0955 05/26/17 0946   05/22/17 1400  anidulafungin (ERAXIS) 100 mg in sodium chloride 0.9 % 100 mL IVPB     100 mg 78 mL/hr over 100 Minutes Intravenous Every 24 hours 05/21/17 1330 05/27/17 1740   05/21/17 1400  anidulafungin (ERAXIS) 200 mg in sodium chloride 0.9 % 200 mL IVPB     200 mg 78 mL/hr over 200 Minutes Intravenous  Once 05/21/17 1330 05/21/17 1940   05/15/17 1000  anidulafungin (ERAXIS) 100 mg in sodium chloride 0.9 % 100 mL IVPB  Status:  Discontinued     100 mg 78 mL/hr over 100 Minutes Intravenous Every 24 hours 05/14/17 0829 05/16/17 0901   05/15/17 1000  metroNIDAZOLE (FLAGYL) IVPB 500 mg  Status:  Discontinued     500 mg 100 mL/hr over 60 Minutes Intravenous Every 8 hours 05/15/17 0903 05/25/17 0938   05/14/17 0900  anidulafungin (ERAXIS) 200 mg in sodium chloride 0.9 % 200 mL IVPB     200 mg 78 mL/hr over 200 Minutes Intravenous  Once 05/14/17 0829 05/14/17 1325   05/13/17 1927  vancomycin (VANCOCIN) 1-5 GM/200ML-% IVPB    Comments:  Ward, Christa   : cabinet override      05/13/17 1927 05/14/17 0744   05/12/17 1400  ceFEPIme (MAXIPIME) 1 g in dextrose 5 % 50 mL IVPB     1 g 100 mL/hr over 30 Minutes Intravenous Every 8 hours 05/12/17 0939 05/26/17 2359   05/12/17 0900  vancomycin (VANCOCIN) IVPB  1000 mg/200 mL premix  Status:  Discontinued     1,000 mg 200 mL/hr over 60 Minutes Intravenous Every 12 hours 05/12/17 0750 05/16/17 0852   05/12/17 0830  aztreonam (AZACTAM) 2 GM IVPB     2 g 100 mL/hr over 30 Minutes Intravenous  Once 05/12/17 0800 05/12/17 0957   05/06/17 0916  vancomycin (VANCOCIN) IVPB 1000 mg/200 mL premix     1,000 mg 200 mL/hr over 60 Minutes Intravenous On call to O.R. 05/06/17 0916 05/06/17 1229     Assessment/Plan Adrenal mass/prior hemicolectomy/ileocolic anastomosis 5277 laparoscopic converted to open right adrenalectomy, small bowel resection with anastomosis, placement of 20cm^2 wound vac, 05/06/17, Dr. Gurney Maxin Post op wound dehiscence - primary repair of small bowel anastomotic leak, abdominal closure of wound dehisence, 05/13/17, Gurney Maxin S/p  reopening of recent laparotomy, lysis of adhesions, small bowel resection, placement 200cm^2 negative pressure dressing, 05/14/17, Gurney Maxin S/p reopening of recent laparotomy, creation of tube ileostomy, placement of 100cm2 negative pressure dressing, 05/17/17, Lurena Joiner [name] S/p abdominal wash out, placement of mesh for abdominal closure, placement of wound vac of 100cm^2, 725/18, Luke [name] S/p Bronchoscopy 05/22/17 Dr. Simonne Maffucci EC fistula 06/13/17 Sepsis -  Acute respiratory failure/ post op HCAP pneumonia/post op pleural effusion - CCM/extubated 05/28/17 Malnutrition - PICC/TNA 05/14/17 Hx of hypertension Hx of anemia Hx of anxiety Hx of hypothyroid Chronic pain medicine use - neck pain Tobacco use FEN:  NPO/ice chips/? TNA ID:  Diflucan 9/5-9/14 DVT: Lovenox   PlaN: Wound vac change later today, continue support and aim for surgery next week.      LOS: 68 days    Debar Plate 07/13/2017 [phone]

## 2017-07-13 NOTE — Care Management Note (Signed)
Case Management Note  Patient Details  Name: [name] [name] MRN: [id number] Date of Birth: Jan 07, 1961  Subjective/Objective:                  Continues with ivt pn, drains and wound vac awaiting new surgical date  Action/Plan: Date:  July 13, 2017 Chart reviewed for concurrent status and case management needs. Will continue to follow patient progress. Discharge Planning: following for needs Expected discharge date: [id number] [name], BSN, [place], [place]  Expected Discharge Date:                  Expected Discharge Plan:  Home/Self Care  In-House Referral:     Discharge planning Services  CM Consult  Post Acute Care Choice:    Choice offered to:     DME Arranged:    DME Agency:     HH Arranged:    HH Agency:     Status of Service:  In process, will continue to follow  If discussed at Long Length of Stay Meetings, dates discussed:    Additional Comments:  Leeroy Cha, RN 07/13/2017, 9:02 AM

## 2017-07-13 NOTE — Progress Notes (Signed)
Physical Therapy Treatment Patient Details Name: [name] [name] MRN: [id number] DOB: Oct 09, 1961 Today's Date: 07/13/2017    History of Present Illness  56 y.o. 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 On 7/18, he developed a leak therefore was taken back to OR for re-do of ex-lap and repair of leak on 05/20/17.  He returned to the ICU on the vent . Extubated on 05/28/17.     PT Comments    Pt amb 2 laps with nursing earlier today.  Returned this afternoon.  Assisted with amb one lap this afternoon.  Pt progressing well with his mobility and is highly motivated.    Follow Up Recommendations  SNF;Supervision/Assistance - 24 hour (pending medical)     Equipment Recommendations  Rolling walker with 5" wheels    Recommendations for Other Services       Precautions / Restrictions Precautions Precaution Comments: multiple lines in abdomen, VAC, 2 ABD drains , and JP drain Restrictions Weight Bearing Restrictions: No    Mobility  Bed Mobility Overal bed mobility: Needs Assistance Bed Mobility: Sit to Supine       Sit to supine: Min guard;Min assist   General bed mobility comments: assisted back to bed B LE due to ABD pain  Transfers Overall transfer level: Needs assistance Equipment used: Rolling walker (2 wheeled) Transfers: Sit to/from Stand Sit to Stand: Supervision;Min guard Stand pivot transfers: Supervision;Min guard       General transfer comment: watched lines, cues for UE placement esp for stand to sit  Ambulation/Gait Ambulation/Gait assistance: Min guard;+2 safety/equipment Ambulation Distance (Feet): 750 Feet (one lap) Assistive device: Rolling walker (2 wheeled) Gait Pattern/deviations: Step-through pattern;Decreased step length - right;Decreased step length -  left;Shuffle;Trunk flexed;Narrow base of support Gait velocity: WFL   General Gait Details: increased c/o ABD pain at end of session.  HR 130    Stairs            Wheelchair Mobility    Modified Rankin (Stroke Patients Only)       Balance                                            Cognition Arousal/Alertness: Awake/alert Behavior During Therapy: WFL for tasks assessed/performed Overall Cognitive Status: Within Functional Limits for tasks assessed                                 General Comments: pleasant/cooperative/motivated      Exercises      General Comments        Pertinent Vitals/Pain Pain Assessment: Faces Faces Pain Scale: Hurts little more Pain Location: ABD after activity Pain Descriptors / Indicators: Cramping Pain Intervention(s): Monitored during session;Repositioned;Patient requesting pain meds-RN notified    Home Living                      Prior Function            PT Goals (current goals can now be found in the care plan section) Progress towards PT goals: Progressing toward goals    Frequency    Min 3X/week      PT Plan Current  plan remains appropriate    Co-evaluation              AM-PAC PT "6 Clicks" Daily Activity  Outcome Measure  Difficulty turning over in bed (including adjusting bedclothes, sheets and blankets)?: A Little Difficulty moving from lying on back to sitting on the side of the bed? : A Lot   Help needed moving to and from a bed to chair (including a wheelchair)?: A Little Help needed walking in hospital room?: A Little Help needed climbing 3-5 steps with a railing? : A Lot 6 Click Score: 13    End of Session Equipment Utilized During Treatment: Gait belt Activity Tolerance: Patient tolerated treatment well Patient left: in bed;with call bell/phone within reach;with family/visitor present Nurse Communication: Mobility status;Patient requests pain  meds PT Visit Diagnosis: Difficulty in walking, not elsewhere classified (R26.2);Muscle weakness (generalized) (M62.81)     Time: 9794-8016 PT Time Calculation (min) (ACUTE ONLY): 14 min  Charges:  $Gait Training: 8-22 mins                    G Codes:       [name]  PTA [facility]  Acute  Rehab Pager      [phone]

## 2017-07-14 LAB — BASIC METABOLIC PANEL
ANION GAP: 8 (ref 5–15)
BUN: 29 mg/dL — ABNORMAL HIGH (ref 6–20)
CALCIUM: 9 mg/dL (ref 8.9–10.3)
CO2: 25 mmol/L (ref 22–32)
Chloride: 101 mmol/L (ref 101–111)
Creatinine, Ser: 0.78 mg/dL (ref 0.61–1.24)
GFR calc non Af Amer: >60 mL/min (ref >60)
Glucose, Bld: 120 mg/dL — ABNORMAL HIGH (ref 65–99)
POTASSIUM: 3.8 mmol/L (ref 3.5–5.1)
Sodium: 134 mmol/L — ABNORMAL LOW (ref 135–145)

## 2017-07-14 LAB — PHOSPHORUS: Phosphorus: 5.1 mg/dL — ABNORMAL HIGH (ref 2.5–4.6)

## 2017-07-14 LAB — MAGNESIUM: MAGNESIUM: 1.5 mg/dL — AB (ref 1.7–2.4)

## 2017-07-14 MED ORDER — MAGNESIUM SULFATE 2 GM/50ML IV SOLN
2.0000 g | Freq: Once | INTRAVENOUS | Status: AC
  Administered 2017-07-14: 2 g via INTRAVENOUS
  Filled 2017-07-14: qty 50

## 2017-07-14 MED ORDER — TRACE MINERALS CR-CU-MN-SE-ZN 10-1000-500-60 MCG/ML IV SOLN
INTRAVENOUS | Status: DC
  Filled 2017-07-14: qty 2644

## 2017-07-14 MED ORDER — MAGNESIUM SULFATE 4 GM/100ML IV SOLN
4.0000 g | Freq: Once | INTRAVENOUS | Status: AC
  Administered 2017-07-14: 4 g via INTRAVENOUS
  Filled 2017-07-14: qty 100

## 2017-07-14 MED ORDER — POTASSIUM CHLORIDE 10 MEQ/50ML IV SOLN
10.0000 meq | INTRAVENOUS | Status: AC
  Administered 2017-07-14 (×4): 10 meq via INTRAVENOUS
  Filled 2017-07-14 (×4): qty 50

## 2017-07-14 MED ORDER — TRACE MINERALS CR-CU-MN-SE-ZN 10-1000-500-60 MCG/ML IV SOLN
INTRAVENOUS | Status: AC
  Administered 2017-07-14: 18:00:00 via INTRAVENOUS
  Filled 2017-07-14 (×2): qty 2644

## 2017-07-14 NOTE — Progress Notes (Signed)
Nutrition Follow-up  DOCUMENTATION CODES:   Not applicable  INTERVENTION:  - Continue cyclic TPN per Pharmacy. - Will monitor for possible diet advancement following surgery next month.   NUTRITION DIAGNOSIS:   Inadequate oral intake related to inability to eat as evidenced by NPO status. -ongoing  GOAL:   Patient will meet greater than or equal to 90% of their needs -met with TPN regimen  MONITOR:   Diet advancement, Weight trends, Labs, Skin, I & O's, Other (Comment) (TPN regimen)  ASSESSMENT:   56 y.o. 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 On 7/18, he developed a leak therefore was taken back to OR for re-do of ex-lap and repair of leak.  Significant events: 7/11 CZ:YSAYT adrenalectomy with complication of intestinal injury and small bowel resection with anastomosis  7/18 KZ:SWFUXN of small bowel anastomotic leak, abdominal closure of wound dehisence 7/19 OR: reopening of recent laparotomy, lysis of adhesions, noted ischemic perforation of new loop of intestine, intact repair of previous anastomotic leak repair, intact previous anastomosis.  7/22 OR: wash out, tube ileostomy, left with open abdomen &wound vac in place. Plan for OR on 7/25 for closure 7/25 OR: mesh placed for abdominal closure, wound vac 8/2extubated, propofol drip off 8/4TPN off for ~ 5 hours due to line detached from filter. Ultrasound-guided aspiration By interventional radiology of small perihepatic fluid collection yielded only 1 mL of the fluid, sent for culture. NPO - mild coffee ground on NG but no active bleed 8/6patient improving. Off Precedex drip since 8/4. Dilaudid Drip being changed over to PCA. Still with some coffee ground-looking material in NG tube.  8/10 hyponatremia and plan to decrease TPN  from 3L/day to 2L/day x1 week 8/65fund to have fecal matter in wound vac (EC fistula) 8/14increase in fecal matter presence in wound vac with no plan for surgery at this time 8/16TPN advanced to goal rate of 115 ml/hr with 20% ILE @ 20 mL/hr x12 hours on MWF 8/22: NGT clamped. RD to order Boost Breeze for patient to sip on; Mycostatin started for oral thrush. 9/1:pulled NGT and no plans for replacement. 9/1:developed N/V and only permitted sips of clears, if tolerated. 9/4:NGT re-insertion attempted several times without success 9/7:begin cycling TPN   9/18 Pt remains NPO and continues cyclic TPN regimen as outlined below (from note on 9/10). TPN run over 14 hours/day. ILE on MWF. Per notes and per rounds this AM, plan for fistula-repair surgery on 10/5. Weight has been mainly stable for the past 2 weeks.    Medications reviewed; 20 mg IV Lasix/day, 2 g IV Mg sulfate x1 run today + 4 g IV Mg sulfate x1 run today (same regimen yesterday), 10 mg IV Reglan TID, 40 mg IV Protonix BID, 10 mEq IV KCl x4 runs today and x4 runs yesterday. Labs reviewed; Na: 134 mmol/L, BUN: 29 mg/dL, Phos: 5.1 mg/dL, Mg: 1.5 mg/dL, Alk Phos elevated but trending down, glucose: 120 mg/dL, triglycerides: 195 mg/dL (on 9/16).    9/13 - Palliative Care following and last saw pt yesterday; note states "high probability he had a component of esophageal thrush- his oral thrush has responded to fluconazole so given his complicated issues with PO intake and nausea should consider 14-21 day course for complete treatment."  - WOC saw pt yesterday and note states wound with 20% brown fecal matter.  -  Dr. Earlie Server note states ongoing plan for surgery later this month. - Pt with triple lumen PICC and continued cyclic TPN regimen. - Weight -2 lbs/0.9 kg from 9/8 through this AM.  Mg: 1.6 mg/dL    9/10 - Pt remains NPO with allowance for popsicles and ice chips as tolerated.  - NGT remains out since 9/1.  - Pt  with triple lumen PICC and receiving cyclic TPN: Clinimix 1/61 @ 50 mL/hr x1 hour, 212 mL/hr x12 hours, 50 mL/hr x1 hour (2644 mL/day) with 20% ILE @ 20 mL/hr x12 hours on MWF.  - This regimen is providing a daily average of 132 grams of protein and 2083 kcal; meeting 100% estimated protein and kcal need.  - Weight continues to fluctuate. - Per Dr. Gala Lewandowsky note this AM, plan is still for reconstructive surgery at the end of the month and Dr. Hassell Done to discuss this during this week.  - Pt remains free of N/V.   10 mg IV Reglan TID, 4 mg IV Zofran TIDPhos: 5.4 mg/dL.    Diet Order:  Diet NPO time specified Except for: Ice Chips, Other (See Comments) TPN (CLINIMIX) Adult without lytes  Skin:  Wound (see comment) (Incisions to R flank and abdomen from 7/11 and 7/18)  Last BM:  9/17  Height:   Ht Readings from Last 1 Encounters:  05/26/17 '5\' 10"'  (1.778 m)    Weight:   Wt Readings from Last 1 Encounters:  07/13/17 173 lb 15.1 oz (78.9 kg)    Ideal Body Weight:  75.45 kg  BMI:  Body mass index is 24.96 kg/m.  Estimated Nutritional Needs:   Kcal:  2030-2270 (25-28 kcal/kg)  Protein:  122-138 grams (1.5-1.7 grams/kg)  Fluid:  2 L/day  EDUCATION NEEDS:   No education needs identified at this time    Jarome Matin, MS, RD, LDN, CNSC Inpatient Clinical Dietitian Pager # [phone] After hours/weekend pager # [phone]

## 2017-07-14 NOTE — Progress Notes (Signed)
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 We gave him some Magic mouthwash for his tongue, he wants something for his face, he sees a small skin color change and thinks it's a fungus also.    Objective: Vital signs in last 24 hours: Temp:  [98.2 F (36.8 C)-98.5 F (36.9 C)] 98.5 F (36.9 C) (09/18 0300) Pulse Rate:  [93-119] 96 (09/18 0800) Resp:  [16-23] 20 (09/18 0800) BP: (83-121)/(50-80) 101/72 (09/18 0800) SpO2:  [96 %-99 %] 98 % (09/18 0800) Last BM Date: 07/12/17 2332 IV 2225 urine  Drain 675  Negative fluid balance 575 Wt 05/04/17 = 89.4 Kg  Wt 78.9 = 07/12/17 Afebrile, VSS  BP down in the early AM Mag 1.5, phos is also low. Intake/Output from previous day: 09/17 0701 - 09/18 0700 In: 2325 [I.V.:2295] Out: 2900 [Urine:2225; Drains:675] Intake/Output this shift: No intake/output data recorded.  General appearance: alert, cooperative and no distress Resp: clear to auscultation bilaterally GI: up brushing his teeth, wound vac in place.  Fistula still present but stable, wound is better and healing in.  We looked at it yesterday with the wound vac change.  Lab Results:   Recent Labs  07/13/17 0351  WBC 9.9  HGB 9.0*  HCT 28.2*  PLT 462*    BMET  Recent Labs  07/13/17 0351 07/14/17 0432  NA 134* 134*  K 3.8 3.8  CL 102 101  CO2 24 25  GLUCOSE 109* 120*  BUN 30* 29*  CREATININE 0.78 0.78  CALCIUM 9.0 9.0   PT/INR No results for input(s): LABPROT, INR in the last 72 hours.   Recent Labs Lab 07/09/17 0722 07/13/17 0351  AST 26 24  ALT 38 29  ALKPHOS 192* 162*  BILITOT 0.4 0.6  PROT 6.9 6.4*  ALBUMIN 2.6* 2.5*     Lipase     Component Value Date/Time   LIPASE 27 09/15/2015 0934     Medications: . Chlorhexidine Gluconate Cloth  6 each Topical Q1200  . diazepam  5 mg Intravenous q12n4p  . enoxaparin (LOVENOX) injection  40 mg Subcutaneous Q24H  . fentaNYL  125 mcg  Transdermal Q72H  . furosemide  20 mg Intravenous Daily  . Influenza vac split quadrivalent PF  0.5 mL Intramuscular Tomorrow-1000  . lidocaine  2 patch Transdermal Q24H  . lip balm  1 application Topical BID  . magic mouthwash  5 mL Oral TID AC & HS  . mouth rinse  15 mL Mouth Rinse q12n4p  . metoCLOPramide (REGLAN) injection  10 mg Intravenous Q8H  . nicotine  14 mg Transdermal Daily  . OLANZapine zydis  5 mg Oral QHS  . ondansetron (ZOFRAN) IV  4 mg Intravenous Q8H  . pantoprazole (PROTONIX) IV  40 mg Intravenous BID  . traZODone  50 mg Oral QHS    Assessment/Plan Adrenal mass/hx of prior hemicolectomy/ileocolic anastomosis 4097 1.  S/p laparoscopic converted to open right adrenalectomy, small bowel resection with anastomosis, placement of 20cm^2 wound vac, 05/06/17, Dr. Lurena Joiner [name] 2.  Post op wound dehiscence - primary repair of small bowel anastomotic leak, abdominal closure of wound dehisence, 05/13/17, Luke [name] 3.  S/p reopening of recent laparotomy, lysis of adhesions, small bowel resection, placement 200cm^2 negative pressure dressing, 05/14/17, Luke [name] 4.  S/p reopening of recent laparotomy, creation of tube ileostomy, placement of 100cm2 negative pressure dressing, 05/17/17, Luke [name] 5.  S/p abdominal wash out, placement  of mesh for abdominal closure, placement of wound vac of 100cm^2, 05/20/17, Luke [name] 6.  S/p Bronchoscopy 05/22/17 Dr. 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 TNA ID:  Diflucan 9/5-9/14 DVT: Lovenox   PlaN: Continue current course, Pharmacy is adjusting lytes and replacing K+ and Mag. Dr. Hassell Done has plans to repair Fistula 07/31/17.     LOS: 69  days    Georgetta Crafton 07/14/2017 [phone]

## 2017-07-14 NOTE — Progress Notes (Signed)
OT Cancellation Note  Patient Details Name: [name] [name] MRN: [id number] DOB: 07-07-1961   Cancelled Treatment:    Reason Eval/Treat Not Completed: Other (comment). Checked on pt at 14:30 and he was just waking up and requested some time.  Returned at 15:30 and he was sleeping soundly. Will check back tomorrow if schedule allows.  Metha Kolasa 07/14/2017, 3:35 PM  [name], OTR/L [phone] 07/14/2017

## 2017-07-14 NOTE — Progress Notes (Signed)
[name] NOTE   Pharmacy Consult for TPN Indication: prolonged ileus, small bowel leak, multiple bowel surgeries  Patient Measurements: Body mass index is 24.96 kg/m. Filed Weights   07/11/17 0500 07/12/17 0600 07/13/17 0600  Weight: 171 lb 4.8 oz (77.7 kg) 174 lb 13.2 oz (79.3 kg) 173 lb 15.1 oz (78.9 kg)   HPI: 65 yoM admitted on 7/11 for enlarging adrenal mass and planned adrenalectomy.  Pharmacy consulted to dose TPN.  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   7/22 OR:  wash out, tube ileostomy, left with open abdomen & wound vac in place.  Plan for OR on 7/25 for closure 7/25 OR: mesh placed for abdominal closure, wound vac 8/2 extubated, propofol drip off 8/4 TPN off for ~ 5 hours due to line detached from filter 8/10 MD request decrease rate x 1 week 2nd low sodium 8/12 Additional sodium added to TPN due to persistent hyponatremia 8/16 Increasing rate back to goal since sodium has improved with above intervention.   8/20 Prealbumin decreased.  New apparent enteric leak per surgery notes. 8/21 CaPhos product elevated, remove electrolytes from TPN 8/25 PICC did not have blood return in 2 of the 3 ports.  Alteplase intra-catheter and function returned.  TPN infusing correctly on 8/26 per RN. 8/28: d/c SSI 8/30: added electrolytes to TPN 8/31: TPN IV line came out around ~0340 and current bag was stopped at this time.  Restart new clinimix 5/15 1L bag (no lytes since phos is elevated this morning) until next bag is due at [facility]. Will not add MVI and trace elements to this 1L  bag. Use electrolyte free clinimix with new bag at [facility] 9/1 NGT out 9/3 added electrolytes to TPN 9/4 lytes out of TPN 9/7 cycle over 18 hrs 9/8 add back electrolytes to TPN and cycle over 14 hours 9/9 lytes out of TPN, continue 14 hr cycle, DC SSI/CBGs 9/13 electrolytes added back to TPN 9/14 remove electrolytes from TPN  Insulin requirements past 24 hours: none (no hx DM)  Current Nutrition: NPO x ice chips, popsicles 9/5  IVF: none. Lasix 20 IV qday  Central access: PICC line ordered 7/19, placed on 7/20 TPN start date: 7/20  ASSESSMENT                                                                                                           Today, 07/14/17  - Glucose (goal <150): at goal -  Electrolytes: K 3.8 (goal > = 4 for ileus), Mag 1.5 ( goal >-2 for ileus), Na 134-trending back down so have increased amt in TPN slightly,  Phos 5.1 with electrolytes removed from TPN, CorrCa 10.2, Ca/phos product 52 = goal is <  55 - Renal:  SCr wnl,  BUN elevated at 29 but stable  - LFTs (9/17): WNL except Alk Phos which is elevated at 162, but trending down.    - TGs:   206 (9/3), 227 (9/10), 195 (9/16) - Prealbumin:  <5 (7/20), 6.1 (7/24), 10.1 (7/30) 24.8 (8/6), 26.4 (8/13), 15.9 (8/20), 19.7 (8/27) 24.7 (9/3) 23.7 (9/10) 18.5(9/17)  NUTRITIONAL GOALS                                                                                             RD recs (9/13):  122-138 gms Protein (1.5 - 1.7 g/kg) 2030 - 2270 Kcal  Recalculation: Clinimix 5/15 over 14h cycle with lipids 3x/week on MWF only would provide: 132g protein and avg 2080 kcal/day  - Average glucose infusion rate will be 4.11 mg/kg/min (Maximum 5 mg/kg/min)  PLAN      Now:   Mag Sulfate 8g IV today  Potassium chloride 31mq x 4 runs    At 1800 today: ? Continue electrolyte-free formulation but continue to add Na to provide the equivalent of normal saline (~150 meq/L) with a chloride to acetate ratio of 1:1 ? Clinimix E 5/15  for total of 26482mday to cycle over 14hrs ? Lipids at 2070mhr x 12 hours 3 times weekly on MWF  MVI/TE in TPN and pepcid 40 mg/day in TPN  TPN lab panels on Mondays & Thursdays.  Mag & Phos daily  [name] [name], BCPS Pager: [phone]/2018 7:04 AM

## 2017-07-14 NOTE — Progress Notes (Signed)
Physical Therapy Treatment Patient Details Name: [name] [name] MRN: [id number] DOB: 07-22-1961 Today's Date: 07/14/2017    History of Present Illness  56 y.o. 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 On 7/18, he developed a leak therefore was taken back to OR for re-do of ex-lap and repair of leak on 05/20/17.  He returned to the ICU on the vent . Extubated on 05/28/17.     PT Comments    Assisted OOB to amb a limited distance in hallway then transferred to w/c.    Follow Up Recommendations  SNF;Supervision/Assistance - 24 hour     Equipment Recommendations  Rolling walker with 5" wheels    Recommendations for Other Services Rehab consult     Precautions / Restrictions Precautions Precautions: Fall Precaution Comments: multiple lines in abdomen, VAC, 2 ABD drains , and JP drain Restrictions Weight Bearing Restrictions: No    Mobility  Bed Mobility Overal bed mobility: Needs Assistance Bed Mobility: Supine to Sit;Sit to Supine     Supine to sit: Min guard;Min assist Sit to supine: Min guard;Min assist   General bed mobility comments: assissted OOB and back to bed.  Increased assist back to bed due to increased ABD pain and fatigue  Transfers Overall transfer level: Needs assistance Equipment used: Rolling walker (2 wheeled) Transfers: Sit to/from Stand Sit to Stand: Supervision;Min guard Stand pivot transfers: Supervision;Min guard       General transfer comment: watched lines, cues for UE placement esp for stand to sit to reach back prior to sit  Ambulation/Gait Ambulation/Gait assistance: Min guard;+2 safety/equipment Ambulation Distance (Feet): 750 Feet Assistive device: Rolling walker (2 wheeled) Gait Pattern/deviations: Step-through pattern;Decreased step length -  right;Decreased step length - left;Shuffle;Trunk flexed;Narrow base of support Gait velocity: WFL   General Gait Details: increased c/o ABD pain at end of session.  HR 128   Stairs            Wheelchair Mobility    Modified Rankin (Stroke Patients Only)       Balance                                            Cognition Arousal/Alertness: Awake/alert Behavior During Therapy: WFL for tasks assessed/performed Overall Cognitive Status: Within Functional Limits for tasks assessed                                 General Comments: pleasant/cooperative/motivated      Exercises      General Comments        Pertinent Vitals/Pain Pain Assessment: Faces Faces Pain Scale: Hurts little more Pain Location: ABD after activity Pain Descriptors / Indicators: Cramping;Constant Pain Intervention(s): Monitored during session;Repositioned;Patient requesting pain meds-RN notified    Home Living                      Prior Function            PT Goals (current goals can now be found in the care plan section) Progress towards PT goals: Progressing toward goals    Frequency    Min 3X/week      PT Plan Current  plan remains appropriate    Co-evaluation              AM-PAC PT "6 Clicks" Daily Activity  Outcome Measure  Difficulty turning over in bed (including adjusting bedclothes, sheets and blankets)?: A Little Difficulty moving from lying on back to sitting on the side of the bed? : A Lot Difficulty sitting down on and standing up from a chair with arms (e.g., wheelchair, bedside commode, etc,.)?: A Lot Help needed moving to and from a bed to chair (including a wheelchair)?: A Little Help needed walking in hospital room?: A Little Help needed climbing 3-5 steps with a railing? : A Lot 6 Click Score: 15    End of Session Equipment Utilized During Treatment: Gait belt Activity Tolerance: Patient tolerated treatment  well Patient left: in bed;with call bell/phone within reach;with family/visitor present Nurse Communication: Mobility status;Patient requests pain meds PT Visit Diagnosis: Difficulty in walking, not elsewhere classified (R26.2);Muscle weakness (generalized) (M62.81)     Time: 5176-1607 PT Time Calculation (min) (ACUTE ONLY): 25 min  Charges:  $Gait Training: 8-22 mins $Therapeutic Activity: 8-22 mins                    G Codes:       [name]  PTA [facility]  Acute  Rehab Pager      [phone]

## 2017-07-15 LAB — BASIC METABOLIC PANEL
Anion gap: 8 (ref 5–15)
BUN: 26 mg/dL — ABNORMAL HIGH (ref 6–20)
CHLORIDE: 100 mmol/L — AB (ref 101–111)
CO2: 25 mmol/L (ref 22–32)
CREATININE: 0.76 mg/dL (ref 0.61–1.24)
Calcium: 8.9 mg/dL (ref 8.9–10.3)
GFR calc non Af Amer: >60 mL/min (ref >60)
GLUCOSE: 126 mg/dL — AB (ref 65–99)
Potassium: 3.9 mmol/L (ref 3.5–5.1)
Sodium: 133 mmol/L — ABNORMAL LOW (ref 135–145)

## 2017-07-15 LAB — MAGNESIUM: MAGNESIUM: 1.6 mg/dL — AB (ref 1.7–2.4)

## 2017-07-15 LAB — PHOSPHORUS: Phosphorus: 4.9 mg/dL — ABNORMAL HIGH (ref 2.5–4.6)

## 2017-07-15 MED ORDER — HYDROMORPHONE HCL 1 MG/ML IJ SOLN
0.5000 mg | INTRAMUSCULAR | Status: DC | PRN
  Administered 2017-07-15: 0.5 mg via INTRAVENOUS
  Filled 2017-07-15: qty 1

## 2017-07-15 MED ORDER — FENTANYL CITRATE (PF) 100 MCG/2ML IJ SOLN
75.0000 ug | INTRAMUSCULAR | Status: DC | PRN
  Administered 2017-07-15 (×7): 75 ug via INTRAVENOUS
  Filled 2017-07-15 (×7): qty 2

## 2017-07-15 MED ORDER — MAGNESIUM SULFATE 4 GM/100ML IV SOLN
4.0000 g | Freq: Once | INTRAVENOUS | Status: AC
  Administered 2017-07-15: 4 g via INTRAVENOUS
  Filled 2017-07-15: qty 100

## 2017-07-15 MED ORDER — TRACE MINERALS CR-CU-MN-SE-ZN 10-1000-500-60 MCG/ML IV SOLN
INTRAVENOUS | Status: AC
  Administered 2017-07-15: 18:00:00 via INTRAVENOUS
  Filled 2017-07-15 (×2): qty 2644

## 2017-07-15 MED ORDER — FUROSEMIDE 10 MG/ML IJ SOLN: 20.0000 mg | Freq: Every day | INTRAMUSCULAR | Status: DC

## 2017-07-15 MED ORDER — POTASSIUM CHLORIDE 10 MEQ/50ML IV SOLN
10.0000 meq | INTRAVENOUS | Status: AC
  Administered 2017-07-15 (×4): 10 meq via INTRAVENOUS
  Filled 2017-07-15 (×4): qty 50

## 2017-07-15 MED ORDER — FAT EMULSION 20 % IV EMUL
240.0000 mL | INTRAVENOUS | Status: AC
  Administered 2017-07-15: 240 mL via INTRAVENOUS
  Filled 2017-07-15: qty 250

## 2017-07-15 MED ORDER — FENTANYL CITRATE (PF) 100 MCG/2ML IJ SOLN
100.0000 ug | INTRAMUSCULAR | Status: DC | PRN
  Administered 2017-07-15 – 2017-07-29 (×128): 100 ug via INTRAVENOUS
  Filled 2017-07-15 (×130): qty 2

## 2017-07-15 NOTE — Progress Notes (Signed)
Physical Therapy Treatment Patient Details Name: [name] [name] MRN: [id number] DOB: October 23, 1961 Today's Date: 07/15/2017    History of Present Illness  56 y.o. 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 On 7/18, he developed a leak therefore was taken back to OR for re-do of ex-lap and repair of leak on 05/20/17.  He returned to the ICU on the vent . Extubated on 05/28/17.     PT Comments    Assisted OOB to amb a greater distance.  Increased c/o nausea.    Follow Up Recommendations  SNF;Supervision/Assistance - 24 hour     Equipment Recommendations  Rolling walker with 5" wheels    Recommendations for Other Services       Precautions / Restrictions Precautions Precautions: Fall Precaution Comments: multiple lines in abdomen, VAC, 2 ABD drains , and JP drain Restrictions Weight Bearing Restrictions: No    Mobility  Bed Mobility Overal bed mobility: Needs Assistance Bed Mobility: Supine to Sit;Sit to Supine     Supine to sit: Min guard;Min assist     General bed mobility comments: increased time and assist with multiple lines   Transfers Overall transfer level: Needs assistance Equipment used: Rolling walker (2 wheeled) Transfers: Sit to/from Stand Sit to Stand: Supervision;Min guard Stand pivot transfers: Supervision;Min guard       General transfer comment: watched lines, cues for UE placement esp for stand to sit to reach back prior to sit  Ambulation/Gait Ambulation/Gait assistance: Min guard;+2 safety/equipment Ambulation Distance (Feet): 1275 Feet Assistive device: Rolling walker (2 wheeled) Gait Pattern/deviations: Step-through pattern;Decreased step length - right;Decreased step length - left;Shuffle;Trunk flexed;Narrow base of support Gait velocity: WFL   General Gait  Details: increased c/o ABD pain at end of session.  HR 119   Stairs            Wheelchair Mobility    Modified Rankin (Stroke Patients Only)       Balance                                            Cognition Arousal/Alertness: Awake/alert Behavior During Therapy: WFL for tasks assessed/performed Overall Cognitive Status: Within Functional Limits for tasks assessed                                 General Comments: pleasant/cooperative/motivated      Exercises      General Comments        Pertinent Vitals/Pain Pain Assessment: Faces Faces Pain Scale: Hurts little more Pain Location: R ABD side "pulling"  Pain Descriptors / Indicators: Cramping;Constant Pain Intervention(s): Monitored during session;Repositioned    Home Living                      Prior Function            PT Goals (current goals can now be found in the care plan section) Progress towards PT goals: Progressing toward goals    Frequency    Min 3X/week      PT Plan Current plan remains appropriate    Co-evaluation  AM-PAC PT "6 Clicks" Daily Activity  Outcome Measure  Difficulty turning over in bed (including adjusting bedclothes, sheets and blankets)?: A Little Difficulty moving from lying on back to sitting on the side of the bed? : A Lot Difficulty sitting down on and standing up from a chair with arms (e.g., wheelchair, bedside commode, etc,.)?: A Lot Help needed moving to and from a bed to chair (including a wheelchair)?: A Little Help needed walking in hospital room?: A Little Help needed climbing 3-5 steps with a railing? : A Lot 6 Click Score: 15    End of Session Equipment Utilized During Treatment: Gait belt Activity Tolerance: Patient tolerated treatment well Patient left: in chair;with call bell/phone within reach Nurse Communication: Mobility status (nausea) PT Visit Diagnosis: Difficulty in walking, not  elsewhere classified (R26.2);Muscle weakness (generalized) (M62.81)     Time: 2919-1660 PT Time Calculation (min) (ACUTE ONLY): 30 min  Charges:  $Gait Training: 8-22 mins $Therapeutic Activity: 8-22 mins                    G Codes:       [name]  PTA [facility]  Acute  Rehab Pager      [phone]

## 2017-07-15 NOTE — Consult Note (Signed)
[facility] Nurse wound consult note Reason for Consult: Routine NPWT dressing change. Bedside RN Thornton Dales assists me today.  Wife and sister in room for my assessment.  Wife takes a Clinical research associate of wound today.  No measuring device in photo. All agree that wound is contracting and smaller than previously noted (last week). Patient's spirits are high today. He reports being up to chair for 90 minutes. Currently watching television. Wound type:Surgical Pressure Injury POA:N/A Measurement:per Monday Wound bed:790% red, moist and granulating, 30% with mesh exposed and gold/light brown fluid beneath and through in a small amount Drainage (amount, consistency, odor) As above Periwound: Periwound maceration is vastly improved. Plan to drap again in the periwound area to keep moist sponge off of tissue as wound contracts. Dressing procedure/placement/frequency: NPWT dressing removed (1piece each white and black foam) using aerosol medical adhesive releaser. Wound and surrounding skin cleansed.  White foam used as wound contact layer, black foam used to cover white foam and any wound bed exposed.  Drape applied and T.R.A.C pad applied at midline. Suction initiated at 111mmHg continuous pressure and an immediate seal is achieved. Patient tolerated procedure well (had been medicated with IV Fentanyl prior to wound care).  Supplies in room for Friday's dressing change. Will plan to see then, if not before. [facility] nursing team will not follow, but will remain available to this patient, the nursing and medical teams.  Please re-consult if needed. Thanks, Maudie Flakes, MSN, RN, [place], Arther Abbott  Pager# [phone]

## 2017-07-15 NOTE — Progress Notes (Signed)
OT Cancellation Note  Patient Details Name: [name] [name] MRN: [id number] DOB: October 04, 1961   Cancelled Treatment:    Reason Eval/Treat Not Completed: Pain limiting ability to participate  [place] 07/15/2017, 3:35 PM  [name], OTR/L [phone] 07/15/2017

## 2017-07-15 NOTE — Progress Notes (Signed)
56 Days Post-Op    CC:  Adrenal mass with post op bowel dehiscence    Subjective: No real change, complains of back pain this AM.  Says he is OOB about 5 hrs perday.  PT is working with him. BP down with ativan and Fentanyl.  Objective: Vital signs in last 24 hours: Temp:  [98 F (36.7 C)-99 F (37.2 C)] 98 F (36.7 C) (09/19 0302) Pulse Rate:  [83-108] 99 (09/19 0700) Resp:  [14-22] 18 (09/19 0700) BP: (78-112)/(50-84) 98/68 (09/19 0700) SpO2:  [87 %-100 %] 99 % (09/19 0700) Weight:  [79.4 kg (175 lb 0.7 oz)] 79.4 kg (175 lb 0.7 oz) (09/19 0500) Last BM Date: 07/15/17 651 IV recorded 1550 urine 640 drain/ileostomy AFEBRILE, bp STILL LOW on and off during the day Wt stable   Pharmacy still replacing Mag and K+  Getting lasix 20 mg daily Last CT 06/29/17 Intake/Output from previous day: 09/18 0701 - 09/19 0700 In: 651.5 [I.V.:501.5; IV Piggyback:150] Out: 2190 [Urine:1550; Drains:640] Intake/Output this shift: No intake/output data recorded.  General appearance: alert, cooperative and no distress Resp: clear to auscultation bilaterally GI: wound vac in place, will change today.  Lab Results:   Recent Labs  07/13/17 0351  WBC 9.9  HGB 9.0*  HCT 28.2*  PLT 462*    BMET  Recent Labs  07/14/17 0432 07/15/17 0500  NA 134* 133*  K 3.8 3.9  CL 101 100*  CO2 25 25  GLUCOSE 120* 126*  BUN 29* 26*  CREATININE 0.78 0.76  CALCIUM 9.0 8.9   PT/INR No results for input(s): LABPROT, INR in the last 72 hours.   Recent Labs Lab 07/09/17 0722 07/13/17 0351  AST 26 24  ALT 38 29  ALKPHOS 192* 162*  BILITOT 0.4 0.6  PROT 6.9 6.4*  ALBUMIN 2.6* 2.5*     Lipase     Component Value Date/Time   LIPASE 27 09/15/2015 0934     Medications: . Chlorhexidine Gluconate Cloth  6 each Topical Q1200  . diazepam  5 mg Intravenous q12n4p  . enoxaparin (LOVENOX) injection  40 mg Subcutaneous Q24H  . fentaNYL  125 mcg Transdermal Q72H  . furosemide  20 mg  Intravenous Daily  . Influenza vac split quadrivalent PF  0.5 mL Intramuscular Tomorrow-1000  . lidocaine  2 patch Transdermal Q24H  . lip balm  1 application Topical BID  . magic mouthwash  5 mL Oral TID AC & HS  . mouth rinse  15 mL Mouth Rinse q12n4p  . metoCLOPramide (REGLAN) injection  10 mg Intravenous Q8H  . nicotine  14 mg Transdermal Daily  . OLANZapine zydis  5 mg Oral QHS  . ondansetron (ZOFRAN) IV  4 mg Intravenous Q8H  . pantoprazole (PROTONIX) IV  40 mg Intravenous BID  . traZODone  50 mg Oral QHS    Assessment/Plan Adrenal mass/hx of prior hemicolectomy/ileocolic anastomosis 9147 1.  S/p laparoscopic converted to open right adrenalectomy, small bowel resection with anastomosis, placement of 20cm^2 wound vac, 05/06/17, Dr. Lurena Joiner [name] 2.  Post op wound dehiscence - primary repair of small bowel anastomotic leak, abdominal closure of wound dehisence, 05/13/17, Luke [name] 3.  S/p reopening of recent laparotomy, lysis of adhesions, small bowel resection, placement 200cm^2 negative pressure dressing, 05/14/17, Luke [name] 4.  S/p reopening of recent laparotomy, creation of tube ileostomy, placement of 100cm2 negative pressure dressing, 05/17/17, Luke [name] 5.  S/p abdominal wash out, placement of mesh for abdominal closure, placement of wound vac  of 100cm^2, 05/20/17, Luke [name] 6.  S/p Bronchoscopy 05/22/17 Dr. 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  Pain/anxiety: Fentanyl patch 100 mcg q 3 days Fentanyl bolus 75 mcg x 5 lidoderm  patch Ativan 1 mg IV TID/pt also has valium and has not taken it yesterday, just one dose om Monday and Tuesday this week.  nicoderm patch Zofran 4 mg IV TID       LOS: 70 days    Haisley Arens 07/15/2017 [phone]

## 2017-07-15 NOTE — Progress Notes (Signed)
Patient ID: [name] [name], male   DOB: 1961-03-02, 56 y.o.   MRN: [id number] [facility] Surgery Progress Note:   57 Days Post-Op  Subjective: Mental status is clear.  Asking about BP-ups and downs Objective: Vital signs in last 24 hours: Temp:  [98 F (36.7 C)-98.2 F (36.8 C)] 98.2 F (36.8 C) (09/19 0800) Pulse Rate:  [83-108] 99 (09/19 0700) Resp:  [14-22] 18 (09/19 0700) BP: (78-112)/(50-84) 98/68 (09/19 0700) SpO2:  [87 %-100 %] 99 % (09/19 0700) Weight:  [79.4 kg (175 lb 0.7 oz)] 79.4 kg (175 lb 0.7 oz) (09/19 0500)  Intake/Output from previous day: 09/18 0701 - 09/19 0700 In: 651.5 [I.V.:501.5; IV Piggyback:150] Out: 2190 [Urine:1550; Drains:640] Intake/Output this shift: Total I/O In: 1360.3 [I.V.:1360.3] Out: -   Physical Exam: Work of breathing is normal.  Complaining of VAC discomfort  Lab Results:  Results for orders placed or performed during the hospital encounter of 05/06/17 (from the past 48 hour(s))  Magnesium     Status: Abnormal   Collection Time: 07/14/17  4:32 AM  Result Value Ref Range   Magnesium 1.5 (L) 1.7 - 2.4 mg/dL  Phosphorus     Status: Abnormal   Collection Time: 07/14/17  4:32 AM  Result Value Ref Range   Phosphorus 5.1 (H) 2.5 - 4.6 mg/dL  Basic metabolic panel     Status: Abnormal   Collection Time: 07/14/17  4:32 AM  Result Value Ref Range   Sodium 134 (L) 135 - 145 mmol/L   Potassium 3.8 3.5 - 5.1 mmol/L   Chloride 101 101 - 111 mmol/L   CO2 25 22 - 32 mmol/L   Glucose, Bld 120 (H) 65 - 99 mg/dL   BUN 29 (H) 6 - 20 mg/dL   Creatinine, Ser 0.78 0.61 - 1.24 mg/dL   Calcium 9.0 8.9 - 10.3 mg/dL   GFR calc non Af Amer >60 >60 mL/min   GFR calc Af Amer >60 >60 mL/min    Comment: (NOTE) The eGFR has been calculated using the CKD EPI equation. This calculation has not been validated in all clinical situations. eGFR's persistently <60 mL/min signify possible Chronic Kidney Disease.    Anion gap 8 5 - 15  Magnesium     Status:  Abnormal   Collection Time: 07/15/17  5:00 AM  Result Value Ref Range   Magnesium 1.6 (L) 1.7 - 2.4 mg/dL  Phosphorus     Status: Abnormal   Collection Time: 07/15/17  5:00 AM  Result Value Ref Range   Phosphorus 4.9 (H) 2.5 - 4.6 mg/dL  Basic metabolic panel     Status: Abnormal   Collection Time: 07/15/17  5:00 AM  Result Value Ref Range   Sodium 133 (L) 135 - 145 mmol/L   Potassium 3.9 3.5 - 5.1 mmol/L   Chloride 100 (L) 101 - 111 mmol/L   CO2 25 22 - 32 mmol/L   Glucose, Bld 126 (H) 65 - 99 mg/dL   BUN 26 (H) 6 - 20 mg/dL   Creatinine, Ser 0.76 0.61 - 1.24 mg/dL   Calcium 8.9 8.9 - 10.3 mg/dL   GFR calc non Af Amer >60 >60 mL/min   GFR calc Af Amer >60 >60 mL/min    Comment: (NOTE) The eGFR has been calculated using the CKD EPI equation. This calculation has not been validated in all clinical situations. eGFR's persistently <60 mL/min signify possible Chronic Kidney Disease.    Anion gap 8 5 - 15  Radiology/Results: No results found.  Anti-infectives: Anti-infectives    Start     Dose/Rate Route Frequency Ordered Stop   07/08/17 1600  fluconazole (DIFLUCAN) IVPB 100 mg     100 mg 50 mL/hr over 60 Minutes Intravenous Every 24 hours 07/08/17 0828 07/14/17 1710   07/01/17 1500  fluconazole (DIFLUCAN) IVPB 100 mg     100 mg 50 mL/hr over 60 Minutes Intravenous Every 24 hours 07/01/17 1401 07/07/17 1715   06/16/17 1200  piperacillin-tazobactam (ZOSYN) IVPB 3.375 g     3.375 g 12.5 mL/hr over 240 Minutes Intravenous Every 8 hours 06/16/17 1026 06/17/17 0004   06/08/17 1200  piperacillin-tazobactam (ZOSYN) IVPB 3.375 g  Status:  Discontinued     3.375 g 12.5 mL/hr over 240 Minutes Intravenous Every 8 hours 06/08/17 1056 06/16/17 1026   05/29/17 2200  ceFEPIme (MAXIPIME) 2 g in dextrose 5 % 50 mL IVPB  Status:  Discontinued     2 g 100 mL/hr over 30 Minutes Intravenous Every 8 hours 05/29/17 2031 06/03/17 0832   05/29/17 2100  metroNIDAZOLE (FLAGYL) IVPB 500 mg   Status:  Discontinued     500 mg 100 mL/hr over 60 Minutes Intravenous Every 8 hours 05/29/17 2029 06/03/17 0832   05/26/17 2200  ceFAZolin (ANCEF) IVPB 1 g/50 mL premix  Status:  Discontinued     1 g 100 mL/hr over 30 Minutes Intravenous Every 8 hours 05/26/17 1008 05/29/17 2023   05/25/17 1100  vancomycin (VANCOCIN) 1,250 mg in sodium chloride 0.9 % 250 mL IVPB  Status:  Discontinued     1,250 mg 166.7 mL/hr over 90 Minutes Intravenous Every 12 hours 05/25/17 0955 05/26/17 0946   05/22/17 1400  anidulafungin (ERAXIS) 100 mg in sodium chloride 0.9 % 100 mL IVPB     100 mg 78 mL/hr over 100 Minutes Intravenous Every 24 hours 05/21/17 1330 05/27/17 1740   05/21/17 1400  anidulafungin (ERAXIS) 200 mg in sodium chloride 0.9 % 200 mL IVPB     200 mg 78 mL/hr over 200 Minutes Intravenous  Once 05/21/17 1330 05/21/17 1940   05/15/17 1000  anidulafungin (ERAXIS) 100 mg in sodium chloride 0.9 % 100 mL IVPB  Status:  Discontinued     100 mg 78 mL/hr over 100 Minutes Intravenous Every 24 hours 05/14/17 0829 05/16/17 0901   05/15/17 1000  metroNIDAZOLE (FLAGYL) IVPB 500 mg  Status:  Discontinued     500 mg 100 mL/hr over 60 Minutes Intravenous Every 8 hours 05/15/17 0903 05/25/17 0938   05/14/17 0900  anidulafungin (ERAXIS) 200 mg in sodium chloride 0.9 % 200 mL IVPB     200 mg 78 mL/hr over 200 Minutes Intravenous  Once 05/14/17 0829 05/14/17 1325   05/13/17 1927  vancomycin (VANCOCIN) 1-5 GM/200ML-% IVPB    Comments:  Ward, Christa   : cabinet override      05/13/17 1927 05/14/17 0744   05/12/17 1400  ceFEPIme (MAXIPIME) 1 g in dextrose 5 % 50 mL IVPB     1 g 100 mL/hr over 30 Minutes Intravenous Every 8 hours 05/12/17 0939 05/26/17 2359   05/12/17 0900  vancomycin (VANCOCIN) IVPB 1000 mg/200 mL premix  Status:  Discontinued     1,000 mg 200 mL/hr over 60 Minutes Intravenous Every 12 hours 05/12/17 0750 05/16/17 0852   05/12/17 0830  aztreonam (AZACTAM) 2 GM IVPB     2 g 100 mL/hr over 30  Minutes Intravenous  Once 05/12/17 0800 05/12/17 0957   05/06/17  1002  vancomycin (VANCOCIN) IVPB 1000 mg/200 mL premix     1,000 mg 200 mL/hr over 60 Minutes Intravenous On call to O.R. 05/06/17 0916 05/06/17 1229      Assessment/Plan: Problem List: Patient Active Problem List   Diagnosis Date Noted  . Hypomagnesemia 06/28/2017  . Hypothyroidism   . 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 Protein-calorie malnutrition, severe ([place]) 06/27/2017  . 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 Pressure injury of skin 05/29/2017  . Ileus ([place])   . Hypokalemia 05/14/2017  . Anastomotic leak of intestine 05/14/2017  . Wound dehiscence, surgical 05/14/2017  . Aspiration pneumonia ([place]) 05/14/2017  . Chronic narcotic use 05/10/2017  . Generalized anxiety disorder 05/10/2017  . Intra-abdominal adhesions s/p SB resection 05/06/2017 05/09/2017  . Throat symptom 03/18/2016  . Multinodular goiter 01/19/2016  . Hyperthyroidism 01/19/2016  . Essential hypertension   . Diarrhea 11/30/2014  . Anemia, iron deficiency 11/01/2014  . Leukocytosis 11/03/2013  . Tobacco abuse 07/26/2013  . COPD (chronic obstructive pulmonary disease) ([place]) 07/26/2013  . Left knee pain 07/26/2013  . Pain in joint, shoulder region 05/03/2013  . GERD 07/24/2008  . 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    TNA support heading toward surgery date Oct 4.   57 Days Post-Op    LOS: 70 days   Matt B. Hassell Done, MD, [facility] [phone] beeper [phone]  07/15/2017 8:28 AM

## 2017-07-15 NOTE — Progress Notes (Signed)
[place] NOTE   Pharmacy Consult for TPN Indication: prolonged ileus, small bowel leak, multiple bowel surgeries  Patient Measurements: Body mass index is 25.12 kg/m. Filed Weights   07/12/17 0600 07/13/17 0600 07/15/17 0500  Weight: 174 lb 13.2 oz (79.3 kg) 173 lb 15.1 oz (78.9 kg) 175 lb 0.7 oz (79.4 kg)   HPI: 6 yoM admitted on 7/11 for enlarging adrenal mass and planned adrenalectomy.  Pharmacy consulted to dose TPN.  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   7/22 OR:  wash out, tube ileostomy, left with open abdomen & wound vac in place.  Plan for OR on 7/25 for closure 7/25 OR: mesh placed for abdominal closure, wound vac 8/2 extubated, propofol drip off 8/4 TPN off for ~ 5 hours due to line detached from filter 8/10 MD request decrease rate x 1 week 2nd low sodium 8/12 Additional sodium added to TPN due to persistent hyponatremia 8/16 Increasing rate back to goal since sodium has improved with above intervention.   8/20 Prealbumin decreased.  New apparent enteric leak per surgery notes. 8/21 CaPhos product elevated, remove electrolytes from TPN 8/25 PICC did not have blood return in 2 of the 3 ports.  Alteplase intra-catheter and function returned.  TPN infusing correctly on 8/26 per RN. 8/28: d/c SSI 8/30: added electrolytes to TPN 8/31: TPN IV line came out around ~0340 and current bag was stopped at this time.  Restart new clinimix 5/15 1L bag (no lytes since phos is elevated this morning) until next bag is due at [facility]. Will not add MVI and trace elements to this 1L  bag. Use electrolyte free clinimix with new bag at [facility] 9/1 NGT out 9/3 added electrolytes to TPN 9/4 lytes out of TPN 9/7 cycle over 18 hrs 9/8 add back electrolytes to TPN and cycle over 14 hours 9/9 lytes out of TPN, continue 14 hr cycle, DC SSI/CBGs 9/13 electrolytes added back to TPN 9/14 remove electrolytes from TPN  Plan fistula-repair surgery on 10/5  Insulin requirements past 24 hours: none (no hx DM)  Current Nutrition: NPO x ice chips, popsicles 9/5  IVF: none. Lasix 20 IV qday - hold 9/19 for low BP  Central access: PICC line ordered 7/19, placed on 7/20 TPN start date: 7/20  ASSESSMENT                                                                                                           Today, 07/15/17  - Glucose (goal <150): at goal -  Electrolytes: K 3.9 (goal > = 4 for ileus), Mag 1.6 ( goal >-2 for ileus) - remain low despite daily aggressive replacement, Na 133-trending back down so have increased amt in  TPN slightly,  Phos 4.9 with electrolytes removed from TPN, CorrCa 10.1, Ca/phos product 49.5 = goal is < 55 - Renal:  SCr wnl,  BUN elevated at 26 but has improved  - LFTs (9/17): WNL except Alk Phos which is elevated at 162, but trending down.    - TGs:   206 (9/3), 227 (9/10), 195 (9/16) - Prealbumin:  <5 (7/20), 6.1 (7/24), 10.1 (7/30) 24.8 (8/6), 26.4 (8/13), 15.9 (8/20), 19.7 (8/27) 24.7 (9/3) 23.7 (9/10) 18.5(9/17)  NUTRITIONAL GOALS                                                                                             RD recs (9/13):  122-138 gms Protein (1.5 - 1.7 g/kg) 2030 - 2270 Kcal  Recalculation: Clinimix 5/15 over 14h cycle with lipids 3x/week on MWF only would provide: 132g protein and avg 2080 kcal/day  - Average glucose infusion rate will be 4.11 mg/kg/min (Maximum 5 mg/kg/min)  PLAN      Now:   Mag Sulfate 8g IV today  Potassium chloride 38mq x 4 runs    At 1800 today: ? Continue electrolyte-free formulation but continue to  add Na to provide the equivalent of normal saline (~150 meq/L) with a chloride to acetate ratio of 1:1 ? Clinimix E 5/15 for total of 26447mday to cycle over 14hrs ? Lipids at 2017mhr x 12 hours 3 times weekly on MWF  MVI/TE in TPN and pepcid 40 mg/day in TPN  TPN lab panels on Mondays & Thursdays.  Mag & Phos daily  EriPeggyann JubaharmD, BCPS Pager: [phone]/2018 8:17 AM

## 2017-07-15 NOTE — Progress Notes (Signed)
Pt having complaints of pain increasing in the abdomen and current ordered dose not working. Dr. Johney Maine notified and fentanyl increased to 197mcg. Will give prn ativan and increased fentanyl dose now per Dr. Johney Maine' request and will re evaluate pt's pain.

## 2017-07-16 LAB — COMPREHENSIVE METABOLIC PANEL
ALBUMIN: 2.7 g/dL — AB (ref 3.5–5.0)
ALT: 22 U/L (ref 17–63)
ANION GAP: 7 (ref 5–15)
AST: 22 U/L (ref 15–41)
Alkaline Phosphatase: 175 U/L — ABNORMAL HIGH (ref 38–126)
BUN: 28 mg/dL — AB (ref 6–20)
CHLORIDE: 101 mmol/L (ref 101–111)
CO2: 24 mmol/L (ref 22–32)
Calcium: 9 mg/dL (ref 8.9–10.3)
Creatinine, Ser: 0.76 mg/dL (ref 0.61–1.24)
GFR calc Af Amer: >60 mL/min (ref >60)
GLUCOSE: 93 mg/dL (ref 65–99)
POTASSIUM: 4.3 mmol/L (ref 3.5–5.1)
Sodium: 132 mmol/L — ABNORMAL LOW (ref 135–145)
Total Bilirubin: 0.6 mg/dL (ref 0.3–1.2)
Total Protein: 6.5 g/dL (ref 6.5–8.1)

## 2017-07-16 LAB — PHOSPHORUS: Phosphorus: 3.8 mg/dL (ref 2.5–4.6)

## 2017-07-16 LAB — MAGNESIUM: Magnesium: 1.4 mg/dL — ABNORMAL LOW (ref 1.7–2.4)

## 2017-07-16 MED ORDER — FUROSEMIDE 10 MG/ML IJ SOLN
20.0000 mg | INTRAMUSCULAR | Status: DC
  Administered 2017-07-16 – 2017-08-01 (×10): 20 mg via INTRAVENOUS
  Filled 2017-07-16 (×15): qty 2

## 2017-07-16 MED ORDER — TRACE MINERALS CR-CU-MN-SE-ZN 10-1000-500-60 MCG/ML IV SOLN: INTRAVENOUS | Status: DC

## 2017-07-16 MED ORDER — MAGNESIUM SULFATE 4 GM/100ML IV SOLN
4.0000 g | Freq: Once | INTRAVENOUS | Status: AC
  Administered 2017-07-16: 4 g via INTRAVENOUS
  Filled 2017-07-16: qty 100

## 2017-07-16 MED ORDER — TRACE MINERALS CR-CU-MN-SE-ZN 10-1000-500-60 MCG/ML IV SOLN
INTRAVENOUS | Status: AC
  Administered 2017-07-16: 18:00:00 via INTRAVENOUS
  Filled 2017-07-16: qty 2000
  Filled 2017-07-16: qty 2644

## 2017-07-16 NOTE — Care Management Note (Signed)
Case Management Note  Patient Details  Name: [name] [name] MRN: [id number] Date of Birth: Jan 17, 1961  Subjective/Objective:                  Awaiting new surg date of [id number]/no changes in treatment  Action/Plan: Date:  July 16, 2017 Chart reviewed for concurrent status and case management needs.  Will continue to follow patient progress.  Discharge Planning: following for needs  Expected discharge date: [id number]  [name], BSN, [place], [place]   Expected Discharge Date:                  Expected Discharge Plan:  Home/Self Care  In-House Referral:     Discharge planning Services  CM Consult  Post Acute Care Choice:    Choice offered to:     DME Arranged:    DME Agency:     HH Arranged:    HH Agency:     Status of Service:  In process, will continue to follow  If discussed at Long Length of Stay Meetings, dates discussed:    Additional Comments:  Leeroy Cha, RN 07/16/2017, 9:43 AM

## 2017-07-16 NOTE — Progress Notes (Signed)
OT Cancellation Note  Patient Details Name: [name] [name] MRN: [id number] DOB: 24-Jun-1961   Cancelled Treatment:    Reason Eval/Treat Not Completed: Pain limiting ability to participate. Pt is up in chair and walked earlier:  He is having difficulty controlling pain at this time. Will check back early next week.  [name] 07/16/2017, 1:38 PM  [name], OTR/L [phone] 07/16/2017

## 2017-07-16 NOTE — Progress Notes (Signed)
47 Days Post-Op    CW:CBJSEGB mass with post op bowel dehiscence    Subjective: [name] [name] says the suction hurts, and that's why [name] [name] takes so much pain medicine.  [name] [name] says [name] [name] will try to decrease ti.,  Objective: Vital signs in last 24 hours: Temp:  [97.7 F (36.5 C)-98.9 F (37.2 C)] 98.9 F (37.2 C) (09/20 0404) Pulse Rate:  [90-109] 107 (09/20 0600) Resp:  [13-22] 21 (09/20 0600) BP: (93-121)/(44-82) 116/74 (09/20 0600) SpO2:  [98 %-100 %] 100 % (09/20 0600) Last BM Date: 07/15/17 4400 IV 2525 urine 825 ileostomy BP was better yesterday - lasix held K+ is up, Mag is still low  Pharmacy replacing   Intake/Output from previous day: 09/19 0701 - 09/20 0700 In: 4409.6 [I.V.:4055.6; IV Piggyback:354] Out: 3350 [Urine:2525; Drains:825] Intake/Output this shift: No intake/output data recorded.  General appearance: alert, cooperative, no distress and getting more fentanyl, complains of nausea also Resp: clear to auscultation bilaterally GI: no real change, complains of pain from suction.    Lab Results:  No results for input(s): WBC, HGB, HCT, PLT in the last 72 hours.  BMET  Recent Labs  07/15/17 0500 07/16/17 0551  NA 133* 132*  K 3.9 4.3  CL 100* 101  CO2 25 24  GLUCOSE 126* 93  BUN 26* 28*  CREATININE 0.76 0.76  CALCIUM 8.9 9.0   PT/INR No results for input(s): LABPROT, INR in the last 72 hours.   Recent Labs Lab 07/13/17 0351 07/16/17 0551  AST 24 22  ALT 29 22  ALKPHOS 162* 175*  BILITOT 0.6 0.6  PROT 6.4* 6.5  ALBUMIN 2.5* 2.7*     Lipase     Component Value Date/Time   LIPASE 27 09/15/2015 0934     Medications: . Chlorhexidine Gluconate Cloth  6 each Topical Q1200  . diazepam  5 mg Intravenous q12n4p  . enoxaparin (LOVENOX) injection  40 mg Subcutaneous Q24H  . fentaNYL  125 mcg Transdermal Q72H  . [START ON 07/17/2017] furosemide  20 mg Intravenous Daily  . Influenza vac split quadrivalent PF  0.5 mL Intramuscular Tomorrow-1000  .  lidocaine  2 patch Transdermal Q24H  . lip balm  1 application Topical BID  . magic mouthwash  5 mL Oral TID AC & HS  . mouth rinse  15 mL Mouth Rinse q12n4p  . metoCLOPramide (REGLAN) injection  10 mg Intravenous Q8H  . nicotine  14 mg Transdermal Daily  . OLANZapine zydis  5 mg Oral QHS  . ondansetron (ZOFRAN) IV  4 mg Intravenous Q8H  . pantoprazole (PROTONIX) IV  40 mg Intravenous BID  . traZODone  50 mg Oral QHS    Assessment/Plan Adrenal mass/hx of prior hemicolectomy/ileocolic anastomosis 1517 1. S/p laparoscopic converted to open right adrenalectomy, small bowel resection with anastomosis, placement of 20cm^2 wound vac, 05/06/17, Dr. Lurena Joiner [name] 2. Post op wound dehiscence - primary repair of small bowel anastomotic leak, abdominal closure of wound dehisence, 05/13/17, Luke [name] 3. S/p reopening of recent laparotomy, lysis of adhesions, small bowel resection, placement 200cm^2 negative pressure dressing, 05/14/17, Luke [name] 4. S/p reopening of recent laparotomy, creation of tube ileostomy, placement of 100cm2 negative pressure dressing, 05/17/17, Luke [name] 5. S/p abdominal wash out, placement of mesh for abdominal closure, placement of wound vac of 100cm^2, 05/20/17, Luke [name] 6. S/p Bronchoscopy 05/22/17 Dr. Simonne Maffucci EC fistula 06/13/17 - noted on exam Sepsis -  Acute respiratory failure/ post op HCAP pneumonia/post op pleural effusion - CCM/extubated 05/28/17  Malnutrition - PICC/TNA 05/14/17 Hx of hypertension Hx of anemia Hx of anxiety Hx of hypothyroid Chronic pain medicine use - neck pain Tobacco use FEN: NPO/ice chips/cyclic TNA ID: Diflucan 9/5-9/14 DVT: Lovenox   Pain/anxiety:  Fentanyl patch 100 mcg q 3 days =>>increased to 125 mcg yesterday Fentanyl bolus 75 mcg x 5    Fentanyl 75 mcg x 10 yesterday =>> increased to 100 mcg --5 doses since 2234 last PM lidoderm 2 patches q 24h Ativan 1 mg IV TID/pt also has valium and has not  taken it yesterday, just one dose om Monday and Tuesday this week.  2 doses yesterday nicoderm patch Zofran 4 mg IV TID 3 doses 9/18, 2 doses 9/19, one dose 0600 this AM so far   Plan:  I am going to change the lasix to q 48 hours, and see how [name] [name] does.  I talked to him about the Fentanyl, and [name] [name] is going to try to decrease the use.          LOS: 71 days    [name] [name] 07/16/2017 [phone]

## 2017-07-16 NOTE — Progress Notes (Addendum)
[place] NOTE   Pharmacy Consult for TPN Indication: prolonged ileus, small bowel leak, multiple bowel surgeries  Patient Measurements: Body mass index is 25.12 kg/m. Filed Weights   07/12/17 0600 07/13/17 0600 07/15/17 0500  Weight: 174 lb 13.2 oz (79.3 kg) 173 lb 15.1 oz (78.9 kg) 175 lb 0.7 oz (79.4 kg)   HPI: 38 yoM admitted on 7/11 for enlarging adrenal mass and planned adrenalectomy.  Pharmacy consulted to dose TPN.  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   7/22 OR:  wash out, tube ileostomy, left with open abdomen & wound vac in place.  Plan for OR on 7/25 for closure 7/25 OR: mesh placed for abdominal closure, wound vac 8/2 extubated, propofol drip off 8/4 TPN off for ~ 5 hours due to line detached from filter 8/10 MD request decrease rate x 1 week 2nd low sodium 8/12 Additional sodium added to TPN due to persistent hyponatremia 8/16 Increasing rate back to goal since sodium has improved with above intervention.   8/20 Prealbumin decreased.  New apparent enteric leak per surgery notes. 8/21 CaPhos product elevated, remove electrolytes from TPN 8/25 PICC did not have blood return in 2 of the 3 ports.  Alteplase intra-catheter and function returned.  TPN infusing correctly on 8/26 per RN. 8/28: d/c SSI 8/30: added electrolytes to TPN 8/31: TPN IV line came out around ~0340 and current bag was stopped at this time.  Restart new clinimix 5/15 1L bag (no lytes since phos is elevated this morning) until next bag is due at [facility]. Will not add MVI and trace elements to this 1L  bag. Use electrolyte free clinimix with new bag at [facility] 9/1 NGT out 9/3 added electrolytes to TPN 9/4 lytes out of TPN 9/7 cycle over 18 hrs 9/8 add back electrolytes to TPN and cycle over 14 hours 9/9 lytes out of TPN, continue 14 hr cycle, DC SSI/CBGs 9/13 electrolytes added back to TPN 9/14 remove electrolytes from TPN  Plan fistula-repair surgery on 10/5  Insulin requirements past 24 hours: none (no hx DM)  Current Nutrition: NPO x ice chips, popsicles 9/5  IVF: none. Lasix 20 IV daily changed to every other day 9/20  Central access: PICC line ordered 7/19, placed on 7/20 TPN start date: 7/20  ASSESSMENT                                                                                                           Today, 07/16/17  - Glucose (goal <150): at goal -  Electrolytes: K 4.3 (goal >/= 4 for ileus), Mag 1.4 (goal >/= 2 for ileus) - remain low despite daily aggressive replacement, Na 132 - will increase sodium in TPN,  Phos  3.8 with electrolytes removed from TPN, CorrCa 10.0, Ca/phos product 38, goal is < 55 - Renal:  SCr wnl,  BUN elevated - LFTs: WNL except Alk Phos elevated at 175    - TGs:   206 (9/3), 227 (9/10), 195 (9/16) - Prealbumin:  <5 (7/20), 6.1 (7/24), 10.1 (7/30) 24.8 (8/6), 26.4 (8/13), 15.9 (8/20), 19.7 (8/27) 24.7 (9/3) 23.7 (9/10) 18.5 (9/17)  NUTRITIONAL GOALS                                                                                             RD recs (9/13):  122-138 gms Protein (1.5 - 1.7 g/kg) 2030 - 2270 Kcal  Recalculation: Clinimix 5/15 over 14h cycle with lipids 3x/week on MWF only would provide: 132g protein and avg 2080 kcal/day   PLAN      Now:   Mag Sulfate 8g IV today  At 1800 today: ? Clinimix 5/15 (electrolyte-free formulation) for total of 2647m/day to cycle over 14hrs ? Add sodium to TPN (add 125 mEq of Na/L in a 1:1 [id number]) for hyponatremia ? Lipids at 27m/hr x 12 hours 3 times weekly on MWF  MVI/TE in TPN and  pepcid 40 mg/day in TPN  TPN lab panels on Mondays & Thursdays.  Mag & Phos daily. BMET in AM.  AmHershal CoriaPharmD, BCPS Pager: [phone]/20/2018 9:51 AM

## 2017-07-16 NOTE — Progress Notes (Signed)
Physical Therapy Treatment Patient Details Name: [name] [name] MRN: [id number] DOB: 08-10-1961 Today's Date: 07/16/2017    History of Present Illness  56 y.o. 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 On 7/18, he developed a leak therefore was taken back to OR for re-do of ex-lap and repair of leak on 05/20/17.  He returned to the ICU on the vent . Extubated on 05/28/17.     PT Comments    Assisted OOB to amb 2 laps around unit.  Pt demonstrating increased concern about a "new" pain R side.  "Feels like I broke a rib".    Follow Up Recommendations  SNF;Supervision/Assistance - 24 hour     Equipment Recommendations  Rolling walker with 5" wheels    Recommendations for Other Services       Precautions / Restrictions Precautions Precautions: Fall Precaution Comments: multiple lines in abdomen, VAC, 2 ABD drains , and JP drain    Mobility  Bed Mobility Overal bed mobility: Needs Assistance Bed Mobility: Supine to Sit     Supine to sit: Min guard;Min assist     General bed mobility comments: increased time and assist with multiple lines   Transfers Overall transfer level: Needs assistance Equipment used: Rolling walker (2 wheeled) Transfers: Sit to/from Stand Sit to Stand: Supervision;Min guard Stand pivot transfers: Supervision;Min guard       General transfer comment: watched lines, cues for UE placement esp for stand to sit to reach back prior to sit  Ambulation/Gait Ambulation/Gait assistance: Min guard;+2 safety/equipment Ambulation Distance (Feet): 1350 Feet Assistive device: Rolling walker (2 wheeled) Gait Pattern/deviations: Step-through pattern;Decreased step length - right;Decreased step length - left;Shuffle;Trunk flexed;Narrow base of support Gait velocity: WFL   General  Gait Details: increased c/o R side pain at end of session.  HR 128   Stairs            Wheelchair Mobility    Modified Rankin (Stroke Patients Only)       Balance                                            Cognition Arousal/Alertness: Awake/alert Behavior During Therapy: WFL for tasks assessed/performed Overall Cognitive Status: Within Functional Limits for tasks assessed                                 General Comments: pleasant/cooperative/motivated      Exercises      General Comments        Pertinent Vitals/Pain Pain Assessment: Faces Faces Pain Scale: Hurts even more Pain Location: R ABD side "feels like I broke a rib" Pain Descriptors / Indicators: Sharp Pain Intervention(s): Monitored during session;Patient requesting pain meds-RN notified    Home Living                      Prior Function            PT Goals (current goals can now be found in the care plan section) Progress towards PT goals: Progressing toward goals    Frequency    Min 3X/week      PT Plan Current plan remains  appropriate    Co-evaluation              AM-PAC PT "6 Clicks" Daily Activity  Outcome Measure  Difficulty turning over in bed (including adjusting bedclothes, sheets and blankets)?: A Little Difficulty moving from lying on back to sitting on the side of the bed? : A Lot Difficulty sitting down on and standing up from a chair with arms (e.g., wheelchair, bedside commode, etc,.)?: A Lot Help needed moving to and from a bed to chair (including a wheelchair)?: A Little Help needed walking in hospital room?: A Little Help needed climbing 3-5 steps with a railing? : A Lot 6 Click Score: 15    End of Session Equipment Utilized During Treatment: Gait belt Activity Tolerance: Patient tolerated treatment well Patient left: in chair;with call bell/phone within reach Nurse Communication: Mobility status PT Visit  Diagnosis: Difficulty in walking, not elsewhere classified (R26.2);Muscle weakness (generalized) (M62.81)     Time: 5456-2563 PT Time Calculation (min) (ACUTE ONLY): 25 min  Charges:  $Gait Training: 8-22 mins $Therapeutic Activity: 8-22 mins                    G Codes:       [name]  PTA [facility]  Acute  Rehab Pager      [phone]

## 2017-07-17 LAB — MAGNESIUM: MAGNESIUM: 1.6 mg/dL — AB (ref 1.7–2.4)

## 2017-07-17 LAB — BASIC METABOLIC PANEL
ANION GAP: 10 (ref 5–15)
BUN: 27 mg/dL — ABNORMAL HIGH (ref 6–20)
CALCIUM: 8.9 mg/dL (ref 8.9–10.3)
CO2: 26 mmol/L (ref 22–32)
CREATININE: 0.75 mg/dL (ref 0.61–1.24)
Chloride: 100 mmol/L — ABNORMAL LOW (ref 101–111)
GLUCOSE: 125 mg/dL — AB (ref 65–99)
Potassium: 3.8 mmol/L (ref 3.5–5.1)
Sodium: 136 mmol/L (ref 135–145)

## 2017-07-17 LAB — PHOSPHORUS: PHOSPHORUS: 4.7 mg/dL — AB (ref 2.5–4.6)

## 2017-07-17 MED ORDER — FAT EMULSION 20 % IV EMUL
240.0000 mL | INTRAVENOUS | Status: AC
  Administered 2017-07-17: 240 mL via INTRAVENOUS
  Filled 2017-07-17: qty 250

## 2017-07-17 MED ORDER — MAGNESIUM SULFATE 4 GM/100ML IV SOLN
4.0000 g | Freq: Once | INTRAVENOUS | Status: AC
  Administered 2017-07-17: 4 g via INTRAVENOUS
  Filled 2017-07-17: qty 100

## 2017-07-17 MED ORDER — TRACE MINERALS CR-CU-MN-SE-ZN 10-1000-500-60 MCG/ML IV SOLN
INTRAVENOUS | Status: AC
  Administered 2017-07-17: 18:00:00 via INTRAVENOUS
  Filled 2017-07-17: qty 2000

## 2017-07-17 MED ORDER — NYSTATIN-TRIAMCINOLONE 100000-0.1 UNIT/GM-% EX CREA
TOPICAL_CREAM | Freq: Two times a day (BID) | CUTANEOUS | Status: DC
  Administered 2017-07-17 – 2017-07-25 (×14): via TOPICAL
  Administered 2017-07-25: 1 via TOPICAL
  Administered 2017-07-26 – 2017-07-30 (×10): via TOPICAL
  Filled 2017-07-17: qty 30

## 2017-07-17 NOTE — Progress Notes (Signed)
   07/17/17 1500  Clinical Encounter Type  Visited With Patient  Visit Type Follow-up  Spiritual Encounters  Spiritual Needs Prayer   Follow up visit with the patient.  Continues to have a good out look despite long hospitalization.  Patient appreciates the visits and the prayers.  Will follow up. Chaplain Katherene Ponto

## 2017-07-17 NOTE — Progress Notes (Signed)
[place] NOTE   Pharmacy Consult for TPN Indication: prolonged ileus, small bowel leak, multiple bowel surgeries  Patient Measurements: Body mass index is 25.12 kg/m. Filed Weights   07/12/17 0600 07/13/17 0600 07/15/17 0500  Weight: 174 lb 13.2 oz (79.3 kg) 173 lb 15.1 oz (78.9 kg) 175 lb 0.7 oz (79.4 kg)   HPI: 64 yoM admitted on 7/11 for enlarging adrenal mass and planned adrenalectomy.  Pharmacy consulted to dose TPN.  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   7/22 OR:  wash out, tube ileostomy, left with open abdomen & wound vac in place.  Plan for OR on 7/25 for closure 7/25 OR: mesh placed for abdominal closure, wound vac 8/2 extubated, propofol drip off 8/4 TPN off for ~ 5 hours due to line detached from filter 8/10 MD request decrease rate x 1 week 2nd low sodium 8/12 Additional sodium added to TPN due to persistent hyponatremia 8/16 Increasing rate back to goal since sodium has improved with above intervention.   8/20 Prealbumin decreased.  New apparent enteric leak per surgery notes. 8/21 CaPhos product elevated, remove electrolytes from TPN 8/25 PICC did not have blood return in 2 of the 3 ports.  Alteplase intra-catheter and function returned.  TPN infusing correctly on 8/26 per RN. 8/28: d/c SSI 8/30: added electrolytes to TPN 8/31: TPN IV line came out around ~0340 and current bag was stopped at this time.  Restart new clinimix 5/15 1L bag (no lytes since phos is elevated this morning) until next bag is due at [facility]. Will not add MVI and trace elements to this 1L  bag. Use electrolyte free clinimix with new bag at [facility] 9/1 NGT out 9/3 added electrolytes to TPN 9/4 lytes out of TPN 9/7 cycle over 18 hrs 9/8 add back electrolytes to TPN and cycle over 14 hours 9/9 lytes out of TPN, continue 14 hr cycle, DC SSI/CBGs 9/13 electrolytes added back to TPN 9/14 remove electrolytes from TPN  Plan fistula-repair surgery on 10/5  Insulin requirements past 24 hours: none (no hx DM)  Current Nutrition: NPO x ice chips, popsicles 9/5  IVF: none. Lasix 20 IV daily changed to every other day 9/20  Central access: PICC line ordered 7/19, placed on 7/20 TPN start date: 7/20  ASSESSMENT                                                                                                           Today, 07/17/17  - Glucose (goal <150): at goal -  Electrolytes: K 3.8 (goal >/= 4 for ileus), Mag 1.6 (goal >/= 2 for ileus) - remains low despite daily aggressive replacement, Na 136 - normalized after increasing sodium in TPN,  Phos 4.7 - increased despite electrolytes removed from TPN, CorrCa 9.94, Ca/phos product 46.7, goal is < 55 - Renal:  SCr wnl,  BUN elevated - LFTs: 9/20 labs WNL except Alk Phos elevated at 175    - TGs:   206 (9/3), 227 (9/10), 195 (9/16) - Prealbumin:  <5 (7/20), 6.1 (7/24), 10.1 (7/30) 24.8 (8/6), 26.4 (8/13), 15.9 (8/20), 19.7 (8/27) 24.7 (9/3) 23.7 (9/10) 18.5 (9/17)  NUTRITIONAL GOALS                                                                                             RD recs (9/13):  122-138 gms Protein (1.5 - 1.7 g/kg) 2030 - 2270 Kcal  Recalculation: Clinimix 5/15 over 14h cycle with lipids 3x/week on MWF only would provide: 132g protein and avg 2080 kcal/day   PLAN      Now:   Mag Sulfate 8g IV today  At 1800 today: ? Clinimix 5/15 (electrolyte-free formulation) for total of 2619m/day to cycle over 14hrs ? Add sodium to TPN (add 125 mEq of Na/L in a 1:1 CGF:[id number]) for hyponatremia ? Lipids at 250m/hr x 12 hours  3 times weekly on MWF  MVI/TE in TPN and pepcid 40 mg/day in TPN  TPN lab panels on Mondays & Thursdays.  BMET, Mag & Phos daily ordered.  AmHershal CoriaPharmD, BCPS Pager: [phone]/21/2018 9:12 AM

## 2017-07-17 NOTE — Progress Notes (Signed)
50 Days Post-Op    DD:UKGURKY mass with post op bowel dehiscence    Subjective: No real change, still complaining of pain but says he is trying to cut back on pain medicines  Objective: Vital signs in last 24 hours: Temp:  [98 F (36.7 C)-99.4 F (37.4 C)] 98.8 F (37.1 C) (09/21 0355) Pulse Rate:  [88-105] 102 (09/21 0600) Resp:  [16-22] 19 (09/21 0600) BP: (92-118)/(59-79) 108/71 (09/21 0600) SpO2:  [95 %-100 %] 99 % (09/21 0600) Last BM Date: 07/15/17 Afebrile, VSS - BP is better Labs stable   Intake/Output from previous day: 09/20 0701 - 09/21 0700 In: 2295.7 [I.V.:2285.7] Out: 1875 [Urine:1200; Drains:675] Intake/Output this shift: No intake/output data recorded.  General appearance: alert, cooperative and no distress Resp: clear to auscultation bilaterally GI: No change, wound vac in place, ileostomy tube working.    Lab Results:  No results for input(s): WBC, HGB, HCT, PLT in the last 72 hours.  BMET  Recent Labs  07/16/17 0551 07/17/17 0531  NA 132* 136  K 4.3 3.8  CL 101 100*  CO2 24 26  GLUCOSE 93 125*  BUN 28* 27*  CREATININE 0.76 0.75  CALCIUM 9.0 8.9   PT/INR No results for input(s): LABPROT, INR in the last 72 hours.   Recent Labs Lab 07/13/17 0351 07/16/17 0551  AST 24 22  ALT 29 22  ALKPHOS 162* 175*  BILITOT 0.6 0.6  PROT 6.4* 6.5  ALBUMIN 2.5* 2.7*     Lipase     Component Value Date/Time   LIPASE 27 09/15/2015 0934     Medications: . Chlorhexidine Gluconate Cloth  6 each Topical Q1200  . diazepam  5 mg Intravenous q12n4p  . enoxaparin (LOVENOX) injection  40 mg Subcutaneous Q24H  . fentaNYL  125 mcg Transdermal Q72H  . furosemide  20 mg Intravenous Q48H  . Influenza vac split quadrivalent PF  0.5 mL Intramuscular Tomorrow-1000  . lidocaine  2 patch Transdermal Q24H  . lip balm  1 application Topical BID  . magic mouthwash  5 mL Oral TID AC & HS  . mouth rinse  15 mL Mouth Rinse q12n4p  . metoCLOPramide (REGLAN)  injection  10 mg Intravenous Q8H  . nicotine  14 mg Transdermal Daily  . OLANZapine zydis  5 mg Oral QHS  . ondansetron (ZOFRAN) IV  4 mg Intravenous Q8H  . pantoprazole (PROTONIX) IV  40 mg Intravenous BID  . traZODone  50 mg Oral QHS    Assessment/Plan Adrenal mass/hx of prior hemicolectomy/ileocolic anastomosis 7062 1. S/p laparoscopic converted to open right adrenalectomy, small bowel resection with anastomosis, placement of 20cm^2 wound vac, 05/06/17, Dr. Lurena Joiner [name] 2. Post op wound dehiscence - primary repair of small bowel anastomotic leak, abdominal closure of wound dehisence, 05/13/17, Luke [name] 3. S/p reopening of recent laparotomy, lysis of adhesions, small bowel resection, placement 200cm^2 negative pressure dressing, 05/14/17, Luke [name] 4. S/p reopening of recent laparotomy, creation of tube ileostomy, placement of 100cm2 negative pressure dressing, 05/17/17, Luke [name] 5. S/p abdominal wash out, placement of mesh for abdominal closure, placement of wound vac of 100cm^2, 05/20/17, Luke [name] 6. S/p Bronchoscopy 05/22/17 Dr. Simonne Maffucci EC fistula 06/13/17 - noted on exam Sepsis -  Acute respiratory failure/ post op HCAP pneumonia/post op pleural effusion - CCM/extubated 05/28/17 Malnutrition - PICC/TNA 05/14/17 Hx of hypertension Hx of anemia Hx of anxiety Hx of hypothyroid Chronic pain medicine use - neck pain Tobacco use FEN: NPO/ice chips/cyclic TNA ID: Diflucan  9/5-9/14 DVT: Lovenox  Plan:  Continue current Rx.    LOS: 72 days    [name],[name] 07/17/2017 [phone]  Will try nystatin cream to the patches of lighter skin that he has developed on his face.  These are not itching.

## 2017-07-17 NOTE — Consult Note (Signed)
[place] Nurse wound follow up Wound type: surgical, full thickness Wound bed:70% red granulating tissue, 30% mesh with brown material underneath Drainage (amount, consistency, odor) small amount of brown drainage in canister Periwound:improving, continued with extra drape today to protect, also added barrier ring opened up in a v shape in lateral corner to help with pinching feeling pt complains of. Dressing procedure/placement/frequency: Cleansed with NS, patted dry, used one white foam for contact layer, one black foam to fill space, covered with drape, attached TRAC pad, suction at 12mmHg continuous pressure, seal achieved immediately. Pt tolerated well. Will order supplies for Monday. Changed dressings around tubes, cleansed sites, wife put antibiotic ointment on tube sites under split gauze dressings. Wife did take picture of wound once everything removed. She, and pt seem in better spirits today. Pt's sister also present. [place] team will return for next NPWT dressing change.  Fara Olden, RN-C, WTA-C Wound Treatment Associate

## 2017-07-17 NOTE — Progress Notes (Signed)
Physical Therapy Treatment Patient Details Name: [name] [name] MRN: [id number] DOB: 18-Dec-1960 Today's Date: 07/17/2017    History of Present Illness  56 y.o. 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 On 7/18, he developed a leak therefore was taken back to OR for re-do of ex-lap and repair of leak on 05/20/17.  He returned to the ICU on the vent . Extubated on 05/28/17.     PT Comments    Assisted OB to amb a greater distance.    Follow Up Recommendations  SNF;Supervision/Assistance - 24 hour     Equipment Recommendations  Rolling walker with 5" wheels    Recommendations for Other Services       Precautions / Restrictions Precautions Precautions: Fall Precaution Comments: multiple lines in abdomen, VAC, 2 ABD drains , and JP drain Restrictions Weight Bearing Restrictions: No    Mobility  Bed Mobility Overal bed mobility: Needs Assistance Bed Mobility: Supine to Sit     Supine to sit: Min guard;Min assist     General bed mobility comments: increased time and assist with multiple lines and upper body   Transfers Overall transfer level: Needs assistance Equipment used: Rolling walker (2 wheeled) Transfers: Sit to/from Stand Sit to Stand: Supervision;Min guard Stand pivot transfers: Supervision;Min guard       General transfer comment: watched lines, cues for UE placement esp for stand to sit to reach back prior to sit  Ambulation/Gait Ambulation/Gait assistance: Min guard Ambulation Distance (Feet): 1550 Feet (3 laps) Assistive device: Rolling walker (2 wheeled) Gait Pattern/deviations: Step-through pattern;Decreased step length - right;Decreased step length - left;Shuffle;Trunk flexed;Narrow base of support Gait velocity: WFL   General Gait Details: tolerated an increased  distance.  3 laps around unit      avg HR 126   Stairs            Wheelchair Mobility    Modified Rankin (Stroke Patients Only)       Balance                                            Cognition Arousal/Alertness: Awake/alert Behavior During Therapy: WFL for tasks assessed/performed Overall Cognitive Status: Within Functional Limits for tasks assessed                                 General Comments: pleasant/cooperative/motivated      Exercises      General Comments        Pertinent Vitals/Pain Pain Assessment: Faces Faces Pain Scale: Hurts a little bit Pain Location: ABD Pain Descriptors / Indicators: Constant Pain Intervention(s): Monitored during session;Repositioned    Home Living                      Prior Function            PT Goals (current goals can now be found in the care plan section) Progress towards PT goals: Progressing toward goals    Frequency    Min 3X/week      PT Plan Current plan remains appropriate    Co-evaluation  AM-PAC PT "6 Clicks" Daily Activity  Outcome Measure  Difficulty turning over in bed (including adjusting bedclothes, sheets and blankets)?: A Little Difficulty moving from lying on back to sitting on the side of the bed? : A Lot Difficulty sitting down on and standing up from a chair with arms (e.g., wheelchair, bedside commode, etc,.)?: A Lot Help needed moving to and from a bed to chair (including a wheelchair)?: A Little Help needed walking in hospital room?: A Little Help needed climbing 3-5 steps with a railing? : A Lot 6 Click Score: 15    End of Session Equipment Utilized During Treatment: Gait belt Activity Tolerance: Patient tolerated treatment well Patient left: in chair;with call bell/phone within reach Nurse Communication: Mobility status PT Visit Diagnosis: Difficulty in walking, not elsewhere classified (R26.2);Muscle weakness  (generalized) (M62.81)     Time: 6734-1937 PT Time Calculation (min) (ACUTE ONLY): 24 min  Charges:  $Gait Training: 8-22 mins $Therapeutic Activity: 8-22 mins                    G Codes:       [name]  PTA [facility]  Acute  Rehab Pager      [phone]

## 2017-07-18 LAB — BASIC METABOLIC PANEL
Anion gap: 8 (ref 5–15)
BUN: 22 mg/dL — AB (ref 6–20)
CHLORIDE: 101 mmol/L (ref 101–111)
CO2: 28 mmol/L (ref 22–32)
Calcium: 8.7 mg/dL — ABNORMAL LOW (ref 8.9–10.3)
Creatinine, Ser: 0.69 mg/dL (ref 0.61–1.24)
GFR calc Af Amer: >60 mL/min (ref >60)
GFR calc non Af Amer: >60 mL/min (ref >60)
GLUCOSE: 128 mg/dL — AB (ref 65–99)
POTASSIUM: 3.3 mmol/L — AB (ref 3.5–5.1)
SODIUM: 137 mmol/L (ref 135–145)

## 2017-07-18 LAB — PHOSPHORUS: Phosphorus: 4.9 mg/dL — ABNORMAL HIGH (ref 2.5–4.6)

## 2017-07-18 LAB — MAGNESIUM: Magnesium: 1.7 mg/dL (ref 1.7–2.4)

## 2017-07-18 MED ORDER — MAGNESIUM SULFATE 4 GM/100ML IV SOLN
4.0000 g | Freq: Once | INTRAVENOUS | Status: AC
  Administered 2017-07-18: 4 g via INTRAVENOUS
  Filled 2017-07-18: qty 100

## 2017-07-18 MED ORDER — TRACE MINERALS CR-CU-MN-SE-ZN 10-1000-500-60 MCG/ML IV SOLN
INTRAVENOUS | Status: AC
  Administered 2017-07-18: 17:00:00 via INTRAVENOUS
  Filled 2017-07-18: qty 2644
  Filled 2017-07-18: qty 1000

## 2017-07-18 MED ORDER — POTASSIUM CHLORIDE 10 MEQ/100ML IV SOLN
10.0000 meq | INTRAVENOUS | Status: AC
  Administered 2017-07-18: 10 meq via INTRAVENOUS
  Filled 2017-07-18: qty 100

## 2017-07-18 MED ORDER — FAT EMULSION 20 % IV EMUL
240.0000 mL | INTRAVENOUS | Status: DC
  Filled 2017-07-18: qty 250

## 2017-07-18 MED ORDER — POTASSIUM CHLORIDE 10 MEQ/100ML IV SOLN
10.0000 meq | INTRAVENOUS | Status: AC
  Administered 2017-07-18 (×4): 10 meq via INTRAVENOUS
  Filled 2017-07-18: qty 100

## 2017-07-18 NOTE — Progress Notes (Signed)
[facility] Office:  [phone] General Surgery Progress Note   LOS: 73 days  POD -  59 Days Post-Op  Chief Complaint: Abdominal pain  Assessment and Plan: 1.  OPEN RIGHT ADRENALECTOMY WITH SMALL BOWEL RESECTION AND ANASTOMOSIS - 05/06/2017 - [name]  Additional surgery - 05/13/2017, 05/14/2017, 05/17/2017 and 05/20/2017  Currently with distal ileum in discontinuity and catheter drainage of the 2 bowel ends. Now has new apparent enteric leak from a separate anastomosis communicating to the anterior abdomen and the VAC dressing.  WBC - 9,900 - 07/13/2017     Looks good with no complaint.  2.  Malnutrition  On TPN (also for bowel rest)  Prealbumin - 07/13/2017 - 18.5 3.  HTN 4.  COPD/Smokes  Bronchoscopy by Dr. Despina Hick - 05/22/2017 5.  Anemia  Hgb - 9.0 - 07/13/2017 6.  On chronic pain meds             Prior to admission - he took about 40 mg oxycodone per day for neck - goes to pain clinic - sees Dr. [name] for neck 7.  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  No complaint. Wife - Ret[place] - [phone])  Objective:   Vitals:   07/18/17 0405 07/18/17 0713  BP:    Pulse:    Resp:    Temp: 98.9 F (37.2 C) 98.1 F (36.7 C)  SpO2:       Intake/Output from previous day:  09/21 0701 - 09/22 0700 In: 1171.8 [I.V.:1071.8; IV  Piggyback:100] Out: 6073 [Urine:675; Drains:915]  Intake/Output this shift:  No intake/output data recorded.   Physical Exam:   General: AA M who is alert and oriented.    HEENT: Normal.  .   Lungs: Clear    Abdomen: RUQ VAC.   Two drains in small bowel.  Right lateral abdominal drain.     Lab Results:    No results for input(s): WBC, HGB, HCT, PLT in the last 72 hours.  BMET    Recent Labs  07/17/17 0531 07/18/17 0400  NA 136 137  K 3.8 3.3*  CL 100* 101  CO2 26 28  GLUCOSE 125* 128*  BUN 27* 22*  CREATININE 0.75 0.69  CALCIUM 8.9 8.7*    PT/INR  No results for input(s): LABPROT, INR in the last 72 hours.  ABG  No results for input(s): PHART, HCO3 in the last 72 hours.  Invalid input(s): PCO2, PO2   Studies/Results:  No results found.   Anti-infectives:   Anti-infectives    Start     Dose/Rate Route Frequency Ordered Stop   07/08/17 1600  fluconazole (DIFLUCAN) IVPB 100 mg     100 mg 50 mL/hr over 60 Minutes Intravenous Every 24 hours 07/08/17 0828 07/14/17 1710   07/01/17 1500  fluconazole (DIFLUCAN) IVPB 100 mg     100 mg 50 mL/hr  over 60 Minutes Intravenous Every 24 hours 07/01/17 1401 07/07/17 1715   06/16/17 1200  piperacillin-tazobactam (ZOSYN) IVPB 3.375 g     3.375 g 12.5 mL/hr over 240 Minutes Intravenous Every 8 hours 06/16/17 1026 06/17/17 0004   06/08/17 1200  piperacillin-tazobactam (ZOSYN) IVPB 3.375 g  Status:  Discontinued     3.375 g 12.5 mL/hr over 240 Minutes Intravenous Every 8 hours 06/08/17 1056 06/16/17 1026   05/29/17 2200  ceFEPIme (MAXIPIME) 2 g in dextrose 5 % 50 mL IVPB  Status:  Discontinued     2 g 100 mL/hr over 30 Minutes Intravenous Every 8 hours 05/29/17 2031 06/03/17 0832   05/29/17 2100  metroNIDAZOLE (FLAGYL) IVPB 500 mg  Status:  Discontinued     500 mg 100 mL/hr over 60 Minutes Intravenous Every 8 hours 05/29/17 2029 06/03/17 0832   05/26/17 2200  ceFAZolin (ANCEF) IVPB 1 g/50 mL premix  Status:  Discontinued      1 g 100 mL/hr over 30 Minutes Intravenous Every 8 hours 05/26/17 1008 05/29/17 2023   05/25/17 1100  vancomycin (VANCOCIN) 1,250 mg in sodium chloride 0.9 % 250 mL IVPB  Status:  Discontinued     1,250 mg 166.7 mL/hr over 90 Minutes Intravenous Every 12 hours 05/25/17 0955 05/26/17 0946   05/22/17 1400  anidulafungin (ERAXIS) 100 mg in sodium chloride 0.9 % 100 mL IVPB     100 mg 78 mL/hr over 100 Minutes Intravenous Every 24 hours 05/21/17 1330 05/27/17 1740   05/21/17 1400  anidulafungin (ERAXIS) 200 mg in sodium chloride 0.9 % 200 mL IVPB     200 mg 78 mL/hr over 200 Minutes Intravenous  Once 05/21/17 1330 05/21/17 1940   05/15/17 1000  anidulafungin (ERAXIS) 100 mg in sodium chloride 0.9 % 100 mL IVPB  Status:  Discontinued     100 mg 78 mL/hr over 100 Minutes Intravenous Every 24 hours 05/14/17 0829 05/16/17 0901   05/15/17 1000  metroNIDAZOLE (FLAGYL) IVPB 500 mg  Status:  Discontinued     500 mg 100 mL/hr over 60 Minutes Intravenous Every 8 hours 05/15/17 0903 05/25/17 0938   05/14/17 0900  anidulafungin (ERAXIS) 200 mg in sodium chloride 0.9 % 200 mL IVPB     200 mg 78 mL/hr over 200 Minutes Intravenous  Once 05/14/17 0829 05/14/17 1325   05/13/17 1927  vancomycin (VANCOCIN) 1-5 GM/200ML-% IVPB    Comments:  Ward, Christa   : cabinet override      05/13/17 1927 05/14/17 0744   05/12/17 1400  ceFEPIme (MAXIPIME) 1 g in dextrose 5 % 50 mL IVPB     1 g 100 mL/hr over 30 Minutes Intravenous Every 8 hours 05/12/17 0939 05/26/17 2359   05/12/17 0900  vancomycin (VANCOCIN) IVPB 1000 mg/200 mL premix  Status:  Discontinued     1,000 mg 200 mL/hr over 60 Minutes Intravenous Every 12 hours 05/12/17 0750 05/16/17 0852   05/12/17 0830  aztreonam (AZACTAM) 2 GM IVPB     2 g 100 mL/hr over 30 Minutes Intravenous  Once 05/12/17 0800 05/12/17 0957   05/06/17 0916  vancomycin (VANCOCIN) IVPB 1000 mg/200 mL premix     1,000 mg 200 mL/hr over 60 Minutes Intravenous On call to O.R. 05/06/17  0916 05/06/17 1229      Alphonsa Overall, MD, FACS Pager: [facility] [facility] Office: [phone] 07/18/2017

## 2017-07-18 NOTE — Progress Notes (Signed)
[place] NOTE   Pharmacy Consult for TPN Indication: prolonged ileus, small bowel leak, multiple bowel surgeries  Patient Measurements: Body mass index is 24.77 kg/m. Filed Weights   07/13/17 0600 07/15/17 0500 07/18/17 0407  Weight: 173 lb 15.1 oz (78.9 kg) 175 lb 0.7 oz (79.4 kg) 172 lb 9.9 oz (78.3 kg)   HPI: 41 yoM admitted on 7/11 for enlarging adrenal mass and planned adrenalectomy.  Pharmacy consulted to dose TPN.  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   7/22 OR:  wash out, tube ileostomy, left with open abdomen & wound vac in place.  Plan for OR on 7/25 for closure 7/25 OR: mesh placed for abdominal closure, wound vac 8/2 extubated, propofol drip off 8/4 TPN off for ~ 5 hours due to line detached from filter 8/10 MD request decrease rate x 1 week 2nd low sodium 8/12 Additional sodium added to TPN due to persistent hyponatremia 8/16 Increasing rate back to goal since sodium has improved with above intervention.   8/20 Prealbumin decreased.  New apparent enteric leak per surgery notes. 8/21 CaPhos product elevated, remove electrolytes from TPN 8/25 PICC did not have blood return in 2 of the 3 ports.  Alteplase intra-catheter and function returned.  TPN infusing correctly on 8/26 per RN. 8/28: d/c SSI 8/30: added electrolytes to TPN 8/31: TPN IV line came out around ~0340 and current bag was stopped at this time.  Restart new clinimix 5/15 1L bag (no lytes since phos is elevated this morning) until next bag is due at [facility]. Will not add MVI and trace elements to this 1L  bag. Use electrolyte free clinimix with new bag at [facility] 9/1 NGT out 9/3 added electrolytes to TPN 9/4 lytes out of TPN 9/7 cycle over 18 hrs 9/8 add back electrolytes to TPN and cycle over 14 hours 9/9 lytes out of TPN, continue 14 hr cycle, DC SSI/CBGs 9/13 electrolytes added back to TPN 9/14 remove electrolytes from TPN  Plan fistula-repair surgery on 10/5  Insulin requirements past 24 hours: none (no hx DM)  Current Nutrition: NPO x ice chips, popsicles 9/5  IVF: none. Lasix 20 IV daily changed to every other day 9/20  Central access: PICC line ordered 7/19, placed on 7/20 TPN start date: 7/20  ASSESSMENT                                                                                                           Today, 07/18/17  - Glucose (goal <150): at goal -  Electrolytes: K 3.3 (goal >/= 4 for ileus), 3 runs ordered. Mag 1.7 (goal >/= 2 for ileus) - remains low despite daily aggressive replacement. Na 137 - normalized after increasing sodium  in TPN,  Phos 4.9 - increased despite electrolytes removed from TPN, CorrCa 9.74, Ca/phos product 47.7, goal is < 55 - Renal:  SCr wnl,  BUN elevated - LFTs: 9/20 labs WNL except Alk Phos elevated at 175    - TGs:   206 (9/3), 227 (9/10), 195 (9/16) - Prealbumin:  <5 (7/20), 6.1 (7/24), 10.1 (7/30) 24.8 (8/6), 26.4 (8/13), 15.9 (8/20), 19.7 (8/27) 24.7 (9/3) 23.7 (9/10) 18.5 (9/17)  NUTRITIONAL GOALS                                                                                             RD recs (9/13):  122-138 gms Protein (1.5 - 1.7 g/kg) 2030 - 2270 Kcal  Recalculation: Clinimix 5/15 over 14h cycle with lipids 3x/week on MWF only would provide: 132g protein and avg 2080 kcal/day   PLAN      Now:   Mag Sulfate 8g IV today  Reordered KCl for a total of 5 runs today.  At 1800 today: ? Clinimix 5/15 (electrolyte-free formulation) for total of 2659m/day to cycle over 14hrs ? Add sodium to TPN (add 125 mEq of Na/L in a 1:1  CJQ:[id number]) for hyponatremia ? Lipids at 248m/hr x 12 hours 3 times weekly on MWF  MVI/TE in TPN and pepcid 40 mg/day in TPN  TPN lab panels on Mondays & Thursdays.  BMET, Mag & Phos daily ordered.  ToRomeo RabonPharmD, pager [phone]/22/2018,8:29 AM.

## 2017-07-19 LAB — MAGNESIUM: Magnesium: 1.7 mg/dL (ref 1.7–2.4)

## 2017-07-19 LAB — PHOSPHORUS: Phosphorus: 5.1 mg/dL — ABNORMAL HIGH (ref 2.5–4.6)

## 2017-07-19 LAB — BASIC METABOLIC PANEL
ANION GAP: 9 (ref 5–15)
BUN: 24 mg/dL — ABNORMAL HIGH (ref 6–20)
CALCIUM: 8.7 mg/dL — AB (ref 8.9–10.3)
CHLORIDE: 101 mmol/L (ref 101–111)
CO2: 30 mmol/L (ref 22–32)
Creatinine, Ser: 0.76 mg/dL (ref 0.61–1.24)
GFR calc Af Amer: >60 mL/min (ref >60)
GFR calc non Af Amer: >60 mL/min (ref >60)
GLUCOSE: 116 mg/dL — AB (ref 65–99)
POTASSIUM: 3.5 mmol/L (ref 3.5–5.1)
Sodium: 140 mmol/L (ref 135–145)

## 2017-07-19 MED ORDER — TRACE MINERALS CR-CU-MN-SE-ZN 10-1000-500-60 MCG/ML IV SOLN
INTRAVENOUS | Status: AC
  Administered 2017-07-19: 18:00:00 via INTRAVENOUS
  Filled 2017-07-19: qty 1000
  Filled 2017-07-19: qty 2644

## 2017-07-19 MED ORDER — POTASSIUM CHLORIDE 10 MEQ/100ML IV SOLN
10.0000 meq | INTRAVENOUS | Status: AC
  Administered 2017-07-19 (×3): 10 meq via INTRAVENOUS
  Filled 2017-07-19: qty 100

## 2017-07-19 MED ORDER — MAGNESIUM SULFATE 4 GM/100ML IV SOLN
4.0000 g | INTRAVENOUS | Status: AC
  Administered 2017-07-19 (×3): 4 g via INTRAVENOUS
  Filled 2017-07-19 (×3): qty 100

## 2017-07-19 NOTE — Progress Notes (Signed)
Pt was sitting up in bed rec'ng meds when I arrived. He and I talked about prior visits, his family (with whom I'm acquainted) and school. Pt enjoys prayer, and today was thankful for prayer and visit. He was happy that he got to go outside today. Pt mentioned he will have another surgery Oct 5 and hopes to be able to eat soon after that. Pt is very pleasant to visit and talk with. Will continue to offer visits and support as needed. Please page [place], [place]   07/19/17 1700  Clinical Encounter Type  Visited With Patient

## 2017-07-19 NOTE — Progress Notes (Signed)
[place] NOTE   Pharmacy Consult for TPN Indication: prolonged ileus, small bowel leak, multiple bowel surgeries  Patient Measurements: Body mass index is 24.96 kg/m. Filed Weights   07/15/17 0500 07/18/17 0407 07/19/17 0500  Weight: 175 lb 0.7 oz (79.4 kg) 172 lb 9.9 oz (78.3 kg) 173 lb 15.1 oz (78.9 kg)   HPI: 62 yoM admitted on 7/11 for enlarging adrenal mass and planned adrenalectomy.  Pharmacy consulted to dose TPN.  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   7/22 OR:  wash out, tube ileostomy, left with open abdomen & wound vac in place.  Plan for OR on 7/25 for closure 7/25 OR: mesh placed for abdominal closure, wound vac 8/2 extubated, propofol drip off 8/4 TPN off for ~ 5 hours due to line detached from filter 8/10 MD request decrease rate x 1 week 2nd low sodium 8/12 Additional sodium added to TPN due to persistent hyponatremia 8/16 Increasing rate back to goal since sodium has improved with above intervention.   8/20 Prealbumin decreased.  New apparent enteric leak per surgery notes. 8/21 CaPhos product elevated, remove electrolytes from TPN 8/25 PICC did not have blood return in 2 of the 3 ports.  Alteplase intra-catheter and function returned.  TPN infusing correctly on 8/26 per RN. 8/28: d/c SSI 8/30: added electrolytes to TPN 8/31: TPN IV line came out around ~0340 and current bag was stopped at this time.  Restart new clinimix 5/15 1L bag (no lytes since phos is elevated this morning) until next bag is due at [facility]. Will not add MVI and trace elements to this 1L  bag. Use electrolyte free clinimix with new bag at [facility] 9/1 NGT out 9/3 added electrolytes to TPN 9/4 lytes out of TPN 9/7 cycle over 18 hrs 9/8 add back electrolytes to TPN and cycle over 14 hours 9/9 lytes out of TPN, continue 14 hr cycle, DC SSI/CBGs 9/13 electrolytes added back to TPN 9/14 remove electrolytes from TPN  Plan fistula-repair surgery on 10/5  Insulin requirements past 24 hours: none (no hx DM)  Current Nutrition: NPO x ice chips, popsicles 9/5  IVF: none. Lasix 20 IV daily changed to every other day 9/20  Central access: PICC line ordered 7/19, placed on 7/20 TPN start date: 7/20  ASSESSMENT                                                                                                           Today, 07/19/17  - Glucose (goal <150): at goal -  Electrolytes:  K 3.5 (goal >/= 4 for ileus), only slightly better after 5 runs yesterday. Mag 1.7 (goal >/= 2 for ileus), remains low despite daily aggressive replacement.  Na 140 -  normalized after increasing sodium in TPN. Phos 5.1 - slowly trending up despite electrolytes removed from TPN, CorrCa 9.74, Ca/phos product 49.6 (goal < 55) - Renal:  SCr wnl - LFTs: 9/20 labs WNL except Alk Phos elevated at 175    - TGs:   206 (9/3), 227 (9/10), 195 (9/16) - Prealbumin:  <5 (7/20), 6.1 (7/24), 10.1 (7/30) 24.8 (8/6), 26.4 (8/13), 15.9 (8/20), 19.7 (8/27) 24.7 (9/3) 23.7 (9/10) 18.5 (9/17)  NUTRITIONAL GOALS                                                                                             RD recs (9/13):  122-138 gms Protein (1.5 - 1.7 g/kg) 2030 - 2270 Kcal  Recalculation: Clinimix 5/15 over 14h cycle with lipids 3x/week on MWF only would provide: 132g protein and avg 2080 kcal/day   PLAN      Now:   Mag Sulfate 4g IV x 3 doses today  KCl 6mq IV today.  At 1800 today: ? Clinimix 5/15 (electrolyte-free formulation) for total of 26429mday to cycle over 14hrs ? Add sodium to TPN (add 125 mEq of Na/L in  a 1:1 ClDV:[id number]) for hyponatremia ? Lipids at 2052mhr x 12 hours 3 times weekly on MWF  MVI/TE in TPN and pepcid 40 mg/day in TPN  TPN lab panels on Mondays & Thursdays.  BMET, Mag & Phos daily ordered.  TomRomeo RabonharmD, pager [phone]/23/2018,7:56 AM.

## 2017-07-19 NOTE — Progress Notes (Signed)
[place] [facility] Office:  [phone] General Surgery Progress Note   LOS: 74 days  POD -  60 Days Post-Op  Chief Complaint: Abdominal pain  Assessment and Plan: 1.  OPEN RIGHT ADRENALECTOMY WITH SMALL BOWEL RESECTION AND ANASTOMOSIS - 05/06/2017 - [name]  Additional surgery - 05/13/2017, 05/14/2017, 05/17/2017 and 05/20/2017  Currently with distal ileum in discontinuity and catheter drainage of the 2 bowel ends. Enteric leak from a separate anastomosis communicating to the anterior abdomen and the VAC dressing.  WBC - 9,900 - 07/13/2017     Still using a fair amount of pain meds and complains of pain around RUQ incision.  In part, this may be because of his chronic narcotic use.  His exam is unremarkable.  Will let him leave the floor and go outside today.  2.  Malnutrition  On TPN (also for bowel rest)  Prealbumin - 07/13/2017 - 18.5 3.  HTN 4.  COPD/Smokes  Bronchoscopy by Dr. Despina Hick - 05/22/2017 5.  Anemia  Hgb - 9.0 - 07/13/2017 6.  On chronic pain meds             Prior to admission - he took about 40 mg oxycodone per day for neck - goes to pain clinic - sees Dr. [name] for neck 7.  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 Active Problems:   GERD   Tobacco abuse   COPD (chronic obstructive pulmonary disease) (HCC)   Anemia, iron deficiency   Essential hypertension   Hyperthyroidism   Intra-abdominal adhesions s/p SB resection 05/06/2017   Chronic narcotic use   Generalized anxiety disorder   Hypokalemia   Anastomotic leak of intestine   Wound dehiscence, surgical   Aspiration pneumonia (HCC)   Ileus (HCC)   Pressure injury of skin   Protein-calorie malnutrition, severe (HCC)   Pheochromocytoma, right, 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   Hypomagnesemia   Hypothyroidism  Subjective:  No specific complaints - though he says he needs the pain meds for his RUQ wound. Wife - Retia -  [phone])  Objective:   Vitals:   07/19/17 0400 07/19/17 0600  BP: (!) 91/54 (!) 95/58  Pulse: 91 89  Resp: 20 19  Temp: 98.7 F (37.1 C)   SpO2: 97% 98%     Intake/Output from previous day:  09/22 0701 - 09/23 0700 In: 2537.5 [I.V.:2537.5] Out: 1750 [Urine:1000; Drains:750]  Intake/Output this shift:  No intake/output data recorded.   Physical Exam:   General: AA M who is alert and oriented.    HEENT: Normal.  .   Lungs: Clear    Abdomen: RUQ VAC.   Two drains in small bowel.  Right lateral abdominal drain.     Lab Results:    No results for input(s): WBC, HGB, HCT, PLT in the last 72 hours.  BMET    Recent Labs  07/18/17 0400 07/19/17 0305  NA 137 140  K 3.3* 3.5  CL 101 101  CO2 28 30  GLUCOSE 128* 116*  BUN 22* 24*  CREATININE 0.69 0.76  CALCIUM 8.7* 8.7*    PT/INR  No results for input(s): LABPROT, INR in the last 72 hours.  ABG  No results for input(s): PHART, HCO3 in the last 72 hours.  Invalid input(s): PCO2, PO2   Studies/Results:  No results found.   Anti-infectives:   Anti-infectives    Start     Dose/Rate Route Frequency Ordered Stop   07/08/17 1600  fluconazole (DIFLUCAN) IVPB 100 mg     100 mg 50 mL/hr over 60 Minutes Intravenous Every 24 hours 07/08/17 0828 07/14/17 1710   07/01/17 1500  fluconazole (DIFLUCAN) IVPB 100 mg     100 mg 50 mL/hr over 60 Minutes Intravenous Every 24 hours 07/01/17 1401 07/07/17 1715   06/16/17 1200  piperacillin-tazobactam (ZOSYN) IVPB 3.375 g     3.375 g 12.5 mL/hr over 240 Minutes Intravenous Every 8 hours 06/16/17 1026 06/17/17 0004   06/08/17 1200  piperacillin-tazobactam (ZOSYN) IVPB 3.375 g  Status:  Discontinued     3.375 g 12.5 mL/hr over 240 Minutes Intravenous Every 8 hours 06/08/17 1056 06/16/17 1026   05/29/17 2200  ceFEPIme (MAXIPIME) 2 g in dextrose 5 % 50 mL IVPB  Status:  Discontinued     2 g 100 mL/hr over 30 Minutes Intravenous Every 8 hours 05/29/17 2031 06/03/17 0832    05/29/17 2100  metroNIDAZOLE (FLAGYL) IVPB 500 mg  Status:  Discontinued     500 mg 100 mL/hr over 60 Minutes Intravenous Every 8 hours 05/29/17 2029 06/03/17 0832   05/26/17 2200  ceFAZolin (ANCEF) IVPB 1 g/50 mL premix  Status:  Discontinued     1 g 100 mL/hr over 30 Minutes Intravenous Every 8 hours 05/26/17 1008 05/29/17 2023   05/25/17 1100  vancomycin (VANCOCIN) 1,250 mg in sodium chloride 0.9 % 250 mL IVPB  Status:  Discontinued     1,250 mg 166.7 mL/hr over 90 Minutes Intravenous Every 12 hours 05/25/17 0955 05/26/17 0946   05/22/17 1400  anidulafungin (ERAXIS) 100 mg in sodium chloride 0.9 % 100 mL IVPB     100 mg 78 mL/hr over 100 Minutes Intravenous Every 24 hours 05/21/17 1330 05/27/17 1740   05/21/17 1400  anidulafungin (ERAXIS) 200 mg in sodium chloride 0.9 % 200 mL IVPB     200 mg 78 mL/hr over 200 Minutes Intravenous  Once 05/21/17 1330 05/21/17 1940   05/15/17 1000  anidulafungin (ERAXIS) 100 mg in sodium chloride 0.9 % 100 mL IVPB  Status:  Discontinued     100 mg 78 mL/hr over 100 Minutes Intravenous Every 24 hours 05/14/17 0829 05/16/17 0901   05/15/17 1000  metroNIDAZOLE (FLAGYL) IVPB 500 mg  Status:  Discontinued     500 mg 100 mL/hr over 60 Minutes Intravenous Every 8 hours 05/15/17 0903 05/25/17 0938   05/14/17 0900  anidulafungin (ERAXIS) 200 mg in sodium chloride 0.9 % 200 mL IVPB     200 mg 78 mL/hr over 200 Minutes Intravenous  Once 05/14/17 0829 05/14/17 1325   05/13/17 1927  vancomycin (VANCOCIN) 1-5 GM/200ML-% IVPB    Comments:  Ward, Christa   : cabinet override      05/13/17 1927 05/14/17 0744   05/12/17 1400  ceFEPIme (MAXIPIME) 1 g in dextrose 5 % 50 mL IVPB     1 g 100 mL/hr over 30 Minutes Intravenous Every 8 hours 05/12/17 0939 05/26/17 2359   05/12/17 0900  vancomycin (VANCOCIN) IVPB 1000 mg/200 mL premix  Status:  Discontinued     1,000 mg 200 mL/hr over 60 Minutes Intravenous Every 12 hours 05/12/17 0750 05/16/17 0852   05/12/17 0830   aztreonam (AZACTAM) 2 GM IVPB     2 g 100 mL/hr over 30 Minutes Intravenous  Once 05/12/17 0800 05/12/17 0957   05/06/17 0916  vancomycin (VANCOCIN) IVPB 1000 mg/200 mL premix     1,000 mg 200 mL/hr over 60 Minutes Intravenous On call to O.R.  05/06/17 0916 05/06/17 [place], MD, FACS Pager: [phone] Surgery Office: [phone] 07/19/2017

## 2017-07-20 LAB — CBC
HCT: 25.3 % — ABNORMAL LOW (ref 39.0–52.0)
Hemoglobin: 8.2 g/dL — ABNORMAL LOW (ref 13.0–17.0)
MCH: 25.2 pg — AB (ref 26.0–34.0)
MCHC: 32.4 g/dL (ref 30.0–36.0)
MCV: 77.6 fL — AB (ref 78.0–100.0)
PLATELETS: 606 10*3/uL — AB (ref 150–400)
RBC: 3.26 MIL/uL — ABNORMAL LOW (ref 4.22–5.81)
RDW: 17.1 % — AB (ref 11.5–15.5)
WBC: 8.8 10*3/uL (ref 4.0–10.5)

## 2017-07-20 LAB — COMPREHENSIVE METABOLIC PANEL
ALT: 17 U/L (ref 17–63)
ANION GAP: 8 (ref 5–15)
AST: 18 U/L (ref 15–41)
Albumin: 2.2 g/dL — ABNORMAL LOW (ref 3.5–5.0)
Alkaline Phosphatase: 151 U/L — ABNORMAL HIGH (ref 38–126)
BUN: 25 mg/dL — ABNORMAL HIGH (ref 6–20)
CHLORIDE: 103 mmol/L (ref 101–111)
CO2: 29 mmol/L (ref 22–32)
Calcium: 8.7 mg/dL — ABNORMAL LOW (ref 8.9–10.3)
Creatinine, Ser: 0.69 mg/dL (ref 0.61–1.24)
Glucose, Bld: 121 mg/dL — ABNORMAL HIGH (ref 65–99)
POTASSIUM: 3.4 mmol/L — AB (ref 3.5–5.1)
SODIUM: 140 mmol/L (ref 135–145)
Total Bilirubin: 0.5 mg/dL (ref 0.3–1.2)
Total Protein: 6.1 g/dL — ABNORMAL LOW (ref 6.5–8.1)

## 2017-07-20 LAB — DIFFERENTIAL
BASOS ABS: 0 10*3/uL (ref 0.0–0.1)
BASOS PCT: 0 %
Eosinophils Absolute: 0.3 10*3/uL (ref 0.0–0.7)
Eosinophils Relative: 4 %
Lymphocytes Relative: 35 %
Lymphs Abs: 3.1 10*3/uL (ref 0.7–4.0)
MONO ABS: 0.9 10*3/uL (ref 0.1–1.0)
Monocytes Relative: 11 %
NEUTROS ABS: 4.4 10*3/uL (ref 1.7–7.7)
Neutrophils Relative %: 50 %

## 2017-07-20 LAB — TRIGLYCERIDES: Triglycerides: 193 mg/dL — ABNORMAL HIGH (ref <150)

## 2017-07-20 LAB — PHOSPHORUS: PHOSPHORUS: 4.7 mg/dL — AB (ref 2.5–4.6)

## 2017-07-20 LAB — PREALBUMIN: PREALBUMIN: 11.5 mg/dL — AB (ref 18–38)

## 2017-07-20 LAB — MAGNESIUM: MAGNESIUM: 1.8 mg/dL (ref 1.7–2.4)

## 2017-07-20 MED ORDER — POTASSIUM CHLORIDE 10 MEQ/100ML IV SOLN
10.0000 meq | INTRAVENOUS | Status: AC
  Administered 2017-07-20 (×6): 10 meq via INTRAVENOUS
  Filled 2017-07-20 (×6): qty 100

## 2017-07-20 MED ORDER — MAGNESIUM SULFATE 4 GM/100ML IV SOLN
4.0000 g | INTRAVENOUS | Status: AC
Start: 2017-07-20 — End: 2017-07-20
  Administered 2017-07-20 (×2): 4 g via INTRAVENOUS
  Filled 2017-07-20 (×2): qty 100

## 2017-07-20 MED ORDER — FAT EMULSION 20 % IV EMUL
240.0000 mL | INTRAVENOUS | Status: AC
  Administered 2017-07-20: 240 mL via INTRAVENOUS
  Filled 2017-07-20: qty 250

## 2017-07-20 MED ORDER — TRACE MINERALS CR-CU-MN-SE-ZN 10-1000-500-60 MCG/ML IV SOLN
INTRAVENOUS | Status: AC
  Administered 2017-07-20: 18:00:00 via INTRAVENOUS
  Filled 2017-07-20: qty 2644
  Filled 2017-07-20: qty 2000

## 2017-07-20 NOTE — Progress Notes (Signed)
[facility] NOTE   Pharmacy Consult for TPN Indication: prolonged ileus, small bowel leak, multiple bowel surgeries  Patient Measurements: Body mass index is 25.05 kg/m. Filed Weights   07/18/17 0407 07/19/17 0500 07/20/17 0500  Weight: 172 lb 9.9 oz (78.3 kg) 173 lb 15.1 oz (78.9 kg) 174 lb 9.7 oz (79.2 kg)   HPI: 46 yoM admitted on 7/11 for enlarging adrenal mass and planned adrenalectomy.  Pharmacy consulted to dose TPN.  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   7/22 OR:  wash out, tube ileostomy, left with open abdomen & wound vac in place.  Plan for OR on 7/25 for closure 7/25 OR: mesh placed for abdominal closure, wound vac 8/2 extubated, propofol drip off 8/4 TPN off for ~ 5 hours due to line detached from filter 8/10 MD request decrease rate x 1 week 2nd low sodium 8/12 Additional sodium added to TPN due to persistent hyponatremia 8/16 Increasing rate back to goal since sodium has improved with above intervention.   8/20 Prealbumin decreased.  New apparent enteric leak per surgery notes. 8/21 CaPhos product elevated, remove electrolytes from TPN 8/25 PICC did not have blood return in 2 of the 3 ports.  Alteplase intra-catheter and function returned.  TPN infusing correctly on 8/26 per RN. 8/28: d/c SSI 8/30: added electrolytes to TPN 8/31: TPN IV line came out around ~0340 and current bag was stopped at this time.  Restart new clinimix 5/15 1L bag (no lytes since phos is elevated this morning) until next bag is due at [facility]. Will not add MVI and trace elements to this 1L  bag. Use electrolyte free clinimix with new bag at [facility] 9/1 NGT out 9/3 added electrolytes to TPN 9/4 lytes out of TPN 9/7 cycle over 18 hrs 9/8 add back electrolytes to TPN and cycle over 14 hours 9/9 lytes out of TPN, continue 14 hr cycle, DC SSI/CBGs 9/13 electrolytes added back to TPN 9/14 remove electrolytes from TPN  Plan fistula-repair surgery on 10/5  Insulin requirements past 24 hours: none (no hx DM)  Current Nutrition: NPO x ice chips, popsicles 9/5  IVF: none. Lasix 20 IV daily changed to every other day 9/20  Central access: PICC line ordered 7/19, placed on 7/20 TPN start date: 7/20  ASSESSMENT                                                                                                           Today, 07/20/17  - Glucose (goal <150): at goal -  Electrolytes:  K 3.4 (goal >/= 4 for ileus) Mag 1.8 (goal >/= 2 for ileus), remains low despite daily aggressive replacement.  Na 140 - normalized after increasing sodium in TPN.  Phos down 4.7 - with electrolytes removed from TPN, CorrCa 10.14, Ca/phos product 47.6 (goal < 55) - Renal:  SCr wnl - LFTs: 9/20 labs WNL except Alk Phos elevated but down at 151   - TGs:   206 (9/3), 227 (9/10), 195 (9/16) - Prealbumin:  <5 (7/20), 6.1 (7/24), 10.1 (7/30) 24.8 (8/6), 26.4 (8/13), 15.9 (8/20), 19.7 (8/27) 24.7 (9/3) 23.7 (9/10) 18.5 (9/17)  NUTRITIONAL GOALS                                                                                             RD recs (9/18):  122-138 gms Protein (1.5 - 1.7 g/kg) 2030 - 2270 Kcal  Recalculation: Clinimix 5/15 over 14h cycle with lipids 3x/week on MWF only would provide: 132g protein and avg 2080 kcal/day   PLAN      Now:   Mag Sulfate 4g IV x 2 doses   KCl 55mq IV x6  At 1800 today: ? Clinimix 5/15 (electrolyte-free formulation) for total of 2641mday to cycle over 14hrs ? Add sodium to TPN (add 125 mEq of Na/L-- 70 mEq/L NaCl and 55 mEq/L Na Acetate) for  hyponatremia ? Lipids at 2074mhr x 12 hours 3 times weekly on MWF  MVI/TE in TPN and pepcid 40 mg/day in TPN  TPN lab panels on Mondays & Thursdays.  BMET, Mag & Phos daily ordered.  AnhDia SitterharmD, BCPS 07/20/2017 7:06 AM

## 2017-07-20 NOTE — Care Management Note (Signed)
Case Management Note  Patient Details  Name: [name] [name] MRN: [id number] Date of Birth: 1961-09-26  Subjective/Objective:                  OPEN RIGHT ADRENALECTOMY WITH SMALL BOWEL RESECTION AND ANASTOMOSIS - 05/06/2017 - [name]             Additional surgery - 05/13/2017, 05/14/2017, 05/17/2017 and 05/20/2017             Currently with distal ileum in discontinuity and catheter drainage of the 2 bowel ends. Enteric leak from a separate anastomosis communicating to the anterior abdomen and the VAC dressing.             WBC - 9,900 - 07/13/2017                Still using a fair amount of pain meds and complains of pain around RUQ incision.  In part, this may be because of his chronic narcotic use.  His exam is unremarkable.             Will let him leave the floor and go outside today.  2.  Malnutrition             On TPN (also for bowel rest)             Prealbumin - 07/13/2017 - 18.5  Action/Plan: Date:  July 20, 2017 Chart reviewed for concurrent status and case management needs.  Will continue to follow patient progress.  Discharge Planning: following for needs  Expected discharge date: [id number]  [name], BSN, [place], [place]    Expected Discharge Date:                  Expected Discharge Plan:  Home/Self Care  In-House Referral:     Discharge planning Services  CM Consult  Post Acute Care Choice:    Choice offered to:     DME Arranged:    DME Agency:     HH Arranged:    HH Agency:     Status of Service:  In process, will continue to follow  If discussed at Long Length of Stay Meetings, dates discussed:    Additional Comments:  Leeroy Cha, RN 07/20/2017, 9:53 AM

## 2017-07-20 NOTE — Progress Notes (Signed)
Physical Therapy Treatment Patient Details Name: [name] [name] MRN: [id number] DOB: [birth year] Today's Date: 07/20/2017    History of Present Illness  56 y.o. 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 On 7/18, he developed a leak therefore was taken back to OR for re-do of ex-lap and repair of leak on 05/20/17.  He returned to the ICU on the vent . Extubated on 05/28/17.     PT Comments    Assisted OOB to amb 2 1/2 laps around unit.    Follow Up Recommendations        Equipment Recommendations  Rolling walker with 5" wheels    Recommendations for Other Services       Precautions / Restrictions Precautions Precautions: Fall Precaution Comments: multiple lines in abdomen, VAC, 2 ABD drains , and JP drain Restrictions Weight Bearing Restrictions: No    Mobility  Bed Mobility Overal bed mobility: Needs Assistance Bed Mobility: Supine to Sit     Supine to sit: Min guard;Min assist     General bed mobility comments: increased time and assist with multiple lines and upper body   Transfers Overall transfer level: Needs assistance Equipment used: Rolling walker (2 wheeled) Transfers: Sit to/from Stand Sit to Stand: Supervision;Min guard Stand pivot transfers: Supervision;Min guard       General transfer comment: watched lines, cues for UE placement esp for stand to sit to reach back prior to sit  Ambulation/Gait Ambulation/Gait assistance: Min guard Ambulation Distance (Feet): 1250 Feet Assistive device: Rolling walker (2 wheeled) Gait Pattern/deviations: Step-through pattern;Decreased step length - right;Decreased step length - left;Shuffle;Trunk flexed;Narrow base of support Gait velocity: WFL   General Gait Details: decreased distance due to increased c/o R upper rib pain from  "pulling"    Stairs            Wheelchair Mobility    Modified Rankin (Stroke Patients Only)       Balance                                            Cognition Arousal/Alertness: Awake/alert Behavior During Therapy: WFL for tasks assessed/performed Overall Cognitive Status: Within Functional Limits for tasks assessed                                 General Comments: pleasant/cooperative/motivated      Exercises      General Comments        Pertinent Vitals/Pain Pain Assessment: Faces Faces Pain Scale: Hurts little more Pain Location: R upper ribs Pain Descriptors / Indicators: Grimacing Pain Intervention(s): Monitored during session;Repositioned    Home Living                      Prior Function            PT Goals (current goals can now be found in the care plan section) Progress towards PT goals: Progressing toward goals    Frequency    Min 3X/week      PT Plan Current plan remains appropriate    Co-evaluation              AM-PAC PT "6  Clicks" Daily Activity  Outcome Measure  Difficulty turning over in bed (including adjusting bedclothes, sheets and blankets)?: A Little Difficulty moving from lying on back to sitting on the side of the bed? : A Lot Difficulty sitting down on and standing up from a chair with arms (e.g., wheelchair, bedside commode, etc,.)?: A Lot Help needed moving to and from a bed to chair (including a wheelchair)?: A Little Help needed walking in hospital room?: A Little Help needed climbing 3-5 steps with a railing? : A Lot 6 Click Score: 15    End of Session Equipment Utilized During Treatment: Gait belt Activity Tolerance: Patient tolerated treatment well Patient left: in chair;with call bell/phone within reach Nurse Communication: Mobility status PT Visit Diagnosis: Difficulty in walking, not elsewhere classified (R26.2);Muscle weakness (generalized) (M62.81)      Time: 1200-1230 PT Time Calculation (min) (ACUTE ONLY): 30 min  Charges:  $Gait Training: 8-22 mins $Therapeutic Activity: 8-22 mins                    G Codes:       [name]  PTA [facility]  Acute  Rehab Pager      [phone]

## 2017-07-20 NOTE — Progress Notes (Signed)
Nutrition Follow-up  DOCUMENTATION CODES:   Not applicable  INTERVENTION:  - Continue cyclic TPN per Pharmacy.  NUTRITION DIAGNOSIS:   Inadequate oral intake related to inability to eat as evidenced by NPO status. -ongoing  GOAL:   Patient will meet greater than or equal to 90% of their needs -met with cyclic TPN regimen.   MONITOR:   Diet advancement, Weight trends, Labs, Skin, I & O's, Other (Comment) (TPN regimen)  ASSESSMENT:   56 y.o. 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 On 7/18, he developed a leak therefore was taken back to OR for re-do of ex-lap and repair of leak.  Significant events: 7/11 ZH:GDJME adrenalectomy with complication of intestinal injury and small bowel resection with anastomosis  7/18 QA:STMHDQ of small bowel anastomotic leak, abdominal closure of wound dehisence 7/19 OR: reopening of recent laparotomy, lysis of adhesions, noted ischemic perforation of new loop of intestine, intact repair of previous anastomotic leak repair, intact previous anastomosis.  7/22 OR: wash out, tube ileostomy, left with open abdomen &wound vac in place. Plan for OR on 7/25 for closure 7/25 OR: mesh placed for abdominal closure, wound vac 8/2extubated, propofol drip off 8/4TPN off for ~ 5 hours due to line detached from filter. Ultrasound-guided aspiration By interventional radiology of small perihepatic fluid collection yielded only 1 mL of the fluid, sent for culture. NPO - mild coffee ground on NG but no active bleed 8/6patient improving. Off Precedex drip since 8/4. Dilaudid Drip being changed over to PCA. Still with some coffee ground-looking material in NG tube.  8/10 hyponatremia and plan to decrease TPN from 3L/day to 2L/day x1 week 8/25fund to have fecal matter in  wound vac (EC fistula) 8/14increase in fecal matter presence in wound vac with no plan for surgery at this time 8/16TPN advanced to goal rate of 115 ml/hr with 20% ILE @ 20 mL/hr x12 hours on MWF 8/22: NGT clamped. RD to order Boost Breeze for patient to sip on; Mycostatin started for oral thrush. 9/1:pulled NGT and no plans for replacement. 9/1:developed N/V and only permitted sips of clears, if tolerated. 9/4:NGT re-insertion attempted several times without success 9/7:begin cycling TPN 9/23: allowed to leave the floor to go outside   9/24 Pt remains NPO. He has a triple lumen PICC which was placed on 05/15/17. He is receiving cyclic TPN: Clinimix 52/22x14 hours (2644 mL/day) with 20% ILE @ 20 mL/hr x12 hours on MWF. This regimen is providing a daily average of 132 grams of protein and 2083 kcal. Weight has been stable since 9/5 (throughtout the time of receiving cyclic TPN).   Medications reviewed; 20 mg IV Lasix every 48 hours, 4 g IV Mg sulfate x2 runs today and x3 runs yesterday, 10 mg IV Reglan TID, 40 mg IV Protonix BID, 10 mEq IV KCl x6 runs today and x3 runs yesterday. Labs reviewed; K: 3.4 mmol/L, BUN: 25 mg/dL, Ca: 8.7 mg/dL, Phos: 4.7 mg/dL, Alk Phos elevated but trending down today.     9/18 Pt remains NPO and continues cyclic TPN regimen as outlined below (from note on 9/10). TPN run over 14 hours/day. ILE on MWF. Per notes and per rounds this AM, plan for fistula-repair surgery on 10/5. Weight has been mainly stable for the past 2 weeks.     9/13 - Palliative Care following and  last saw pt yesterday; note states "high probability he had a component of esophageal thrush- his oral thrush has responded to fluconazole so given his complicated issues with PO intake and nausea should consider 14-21 day course for complete treatment."  - WOC saw pt yesterday and note states wound with 20% brown fecal matter.  - Dr. Earlie Server note states ongoing plan for surgery later this  month. - Pt with triple lumen PICC and continued cyclic TPN regimen. - Weight -2 lbs/0.9 kg from 9/8 through this AM.  Mg: 1.6 mg/dL     Diet Order:  Diet NPO time specified Except for: Ice Chips, Other (See Comments)  Skin:  Wound (see comment) (Incisions to R flank and abdomen from 7/11 and 7/18)  Last BM:  9/17  Height:   Ht Readings from Last 1 Encounters:  05/26/17 '5\' 10"'  (1.778 m)    Weight:   Wt Readings from Last 1 Encounters:  07/20/17 174 lb 9.7 oz (79.2 kg)    Ideal Body Weight:  75.45 kg  BMI:  Body mass index is 25.05 kg/m.  Estimated Nutritional Needs:   Kcal:  2030-2270 (25-28 kcal/kg)  Protein:  122-138 grams (1.5-1.7 grams/kg)  Fluid:  2 L/day  EDUCATION NEEDS:   No education needs identified at this time    Jarome Matin, MS, RD, LDN, CNSC Inpatient Clinical Dietitian Pager # [phone] After hours/weekend pager # [phone]

## 2017-07-20 NOTE — Consult Note (Signed)
[facility] Nurse wound follow up Wound type: NPWT (VAC) change to RLQ surgical site Measurement: 3.8 cm x 16.5 c, x 1.3 cm  Wound bed: 75% pink moist and granulating 25% with surgical mesh and brown feculent material passing through Drainage (amount, consistency, odor) Brown feculent thick effluent Periwound:Barrier ring used at distal end due to "pulling" voiced by patient.   Dressing procedure/placement/frequency:NPWT wound dressing removed (1 piece each of black and white foam) with aerosol medical adhesive remover, wound and periwound tissue cleansed and patted dry.  Periwound covered with strips of V.A.C. Drape. One piece of white foam used to cover 80% of wound bed, 1 piece of black foam used to cover white foam and rest of wound bed. Foam is secured with drape, dressing is attached to negative pressure at 166mHg continuous.  An immediate seal is achieved. Patient was premedicated with fentanyl IV and tolerated procedure well. Patient is engaged and pleasant today.  Supplies to be ordered for Wednesday dressing change.  Need adhesive remover and black foam kit WOC team will follow.  KDomenic MorasRN BSN CLex[place]Pager [phone]

## 2017-07-21 LAB — BASIC METABOLIC PANEL
Anion gap: 8 (ref 5–15)
BUN: 21 mg/dL — ABNORMAL HIGH (ref 6–20)
CO2: 28 mmol/L (ref 22–32)
Calcium: 8.7 mg/dL — ABNORMAL LOW (ref 8.9–10.3)
Chloride: 103 mmol/L (ref 101–111)
Creatinine, Ser: 0.68 mg/dL (ref 0.61–1.24)
GFR calc Af Amer: >60 mL/min (ref >60)
GFR calc non Af Amer: >60 mL/min (ref >60)
Glucose, Bld: 122 mg/dL — ABNORMAL HIGH (ref 65–99)
Potassium: 3.5 mmol/L (ref 3.5–5.1)
Sodium: 139 mmol/L (ref 135–145)

## 2017-07-21 LAB — MAGNESIUM: Magnesium: 1.5 mg/dL — ABNORMAL LOW (ref 1.7–2.4)

## 2017-07-21 LAB — PHOSPHORUS: Phosphorus: 4.8 mg/dL — ABNORMAL HIGH (ref 2.5–4.6)

## 2017-07-21 MED ORDER — TRACE MINERALS CR-CU-MN-SE-ZN 10-1000-500-60 MCG/ML IV SOLN
INTRAVENOUS | Status: AC
  Administered 2017-07-21: 18:00:00 via INTRAVENOUS
  Filled 2017-07-21: qty 1000

## 2017-07-21 MED ORDER — MAGNESIUM SULFATE 4 GM/100ML IV SOLN
4.0000 g | Freq: Four times a day (QID) | INTRAVENOUS | Status: DC
  Filled 2017-07-21 (×2): qty 100

## 2017-07-21 MED ORDER — MAGNESIUM SULFATE 4 GM/100ML IV SOLN
4.0000 g | Freq: Four times a day (QID) | INTRAVENOUS | Status: DC
  Filled 2017-07-21: qty 100

## 2017-07-21 MED ORDER — POTASSIUM CHLORIDE 10 MEQ/100ML IV SOLN
INTRAVENOUS | Status: AC
  Administered 2017-07-21: 10 meq
  Filled 2017-07-21: qty 100

## 2017-07-21 MED ORDER — DEXTROSE 5 % IV SOLN
8.0000 g | Freq: Once | INTRAVENOUS | Status: AC
  Administered 2017-07-21: 8 g via INTRAVENOUS
  Filled 2017-07-21: qty 10

## 2017-07-21 MED ORDER — POTASSIUM CHLORIDE 10 MEQ/100ML IV SOLN
INTRAVENOUS | Status: AC
  Administered 2017-07-21: 10 meq via INTRAVENOUS
  Filled 2017-07-21: qty 100

## 2017-07-21 MED ORDER — POTASSIUM CHLORIDE 10 MEQ/100ML IV SOLN
10.0000 meq | INTRAVENOUS | Status: AC
  Administered 2017-07-21 (×4): 10 meq via INTRAVENOUS
  Filled 2017-07-21 (×2): qty 100

## 2017-07-21 NOTE — Consult Note (Signed)
[facility] Nurse wound consult note Reason for Consult:Patient complaining of pain last night and this morning (9/10) from T.R.A.C. Pad near ribcage and at lateral edge of NPWT dressing.  Requesting dressing change. Has been medicated several times for wound related pain. Wound type: Surgical Pressure Injury POA:N/A Measurement:per yesterday 3.8 x 16.5 x 1.3cm Wound bed:70% red, 30% with exposed mesh with tan effluent coming through mesh. Drainage (amount, consistency, odor) Tan effluent in tubing. Periwound: Some periwound maceration from 9-3 o'clock and from 3-7 o'clock.. Dressing procedure/placement/frequency: Spray adhesive released used to remove old dressing, wound cleansed and patted dry.  Drape applied to periwound tissue.1 piece of white foam used to cover 80% of wound bed, 1 piece black foam used to cover entire wound bed. Drape applied to seal foam. One piece of black foam used to make a bridge to the 6 o'clock position so that T.R.A.C. pad would not rest on patient's ribcage. This foam was covered with drape and an opening made to place the T.R.A.C pad.  Attached to negative pressure at 161mmHg continuous pressure and an immediate seal is achieved.  We do not change cannister today as it has just been changed in the middle of the night.  We must now adjust the NPWT dressing change schedule to get back to the M/W/F dressing change.  CCS:  May we omit dressing change on Wednesday since we have just performed on two consecutive days and perform on Friday this week and return to M/W/F schedule?   [facility] nursing team will follow, and will remain available to this patient, the nursing, surgical and medical teams.   Thanks, Maudie Flakes, MSN, RN, [place], Arther Abbott  Pager# [phone]

## 2017-07-21 NOTE — Progress Notes (Signed)
Patient ID: [name] [name], male   DOB: 07-25-1961, 56 y.o.   MRN: [id number]  [facility] Surgery Progress Note  62 Days Post-Op  Subjective: CC- abdominal pain and nausea Continues to complain of significant abdominal pain (most severe where the wound vac is in place), took 10 doses of fentanyl yesterday. Also continues to have intermittent nausea.  Objective: Vital signs in last 24 hours: Temp:  [97.4 F (36.3 C)-98.2 F (36.8 C)] 97.4 F (36.3 C) (09/25 0357) Pulse Rate:  [25-95] 89 (09/25 0600) Resp:  [0-29] 19 (09/25 0600) BP: (90-123)/(58-87) 112/58 (09/25 0600) SpO2:  [83 %-100 %] 100 % (09/25 0600) Weight:  [177 lb 4 oz (80.4 kg)] 177 lb 4 oz (80.4 kg) (09/25 0500) Last BM Date: 07/15/17  Intake/Output from previous day: 09/24 0701 - 09/25 0700 In: 916.2 [I.V.:716.2; IV Piggyback:200] Out: 2080 [Urine:1275; Drains:805] Intake/Output this shift: No intake/output data recorded.  PE: Gen: Alert, NAD HEENT: EOMs intact, pupils equal and round Pulm: Normal effort, CTAB Cardio: RRR Abd: soft, ND, +BS, wound vac in place RUQ, two foleys in place and draining  Lab Results:   Recent Labs  07/20/17 0500  WBC 8.8  HGB 8.2*  HCT 25.3*  PLT 606*   BMET  Recent Labs  07/20/17 0500 07/21/17 0349  NA 140 139  K 3.4* 3.5  CL 103 103  CO2 29 28  GLUCOSE 121* 122*  BUN 25* 21*  CREATININE 0.69 0.68  CALCIUM 8.7* 8.7*   PT/INR No results for input(s): LABPROT, INR in the last 72 hours. CMP     Component Value Date/Time   NA 139 07/21/2017 0349   K 3.5 07/21/2017 0349   CL 103 07/21/2017 0349   CO2 28 07/21/2017 0349   GLUCOSE 122 (H) 07/21/2017 0349   BUN 21 (H) 07/21/2017 0349   CREATININE 0.68 07/21/2017 0349   CREATININE 0.85 07/22/2013 1021   CALCIUM 8.7 (L) 07/21/2017 0349   PROT 6.1 (L) 07/20/2017 0500   ALBUMIN 2.2 (L) 07/20/2017 0500   AST 18 07/20/2017 0500   ALT 17 07/20/2017 0500   ALKPHOS 151 (H) 07/20/2017 0500   BILITOT 0.5  07/20/2017 0500   GFRNONAA >60 07/21/2017 0349   GFRAA >60 07/21/2017 0349   Lipase     Component Value Date/Time   LIPASE 27 09/15/2015 0934       Studies/Results: No results found.  Anti-infectives: Anti-infectives    Start     Dose/Rate Route Frequency Ordered Stop   07/08/17 1600  fluconazole (DIFLUCAN) IVPB 100 mg     100 mg 50 mL/hr over 60 Minutes Intravenous Every 24 hours 07/08/17 0828 07/14/17 1710   07/01/17 1500  fluconazole (DIFLUCAN) IVPB 100 mg     100 mg 50 mL/hr over 60 Minutes Intravenous Every 24 hours 07/01/17 1401 07/07/17 1715   06/16/17 1200  piperacillin-tazobactam (ZOSYN) IVPB 3.375 g     3.375 g 12.5 mL/hr over 240 Minutes Intravenous Every 8 hours 06/16/17 1026 06/17/17 0004   06/08/17 1200  piperacillin-tazobactam (ZOSYN) IVPB 3.375 g  Status:  Discontinued     3.375 g 12.5 mL/hr over 240 Minutes Intravenous Every 8 hours 06/08/17 1056 06/16/17 1026   05/29/17 2200  ceFEPIme (MAXIPIME) 2 g in dextrose 5 % 50 mL IVPB  Status:  Discontinued     2 g 100 mL/hr over 30 Minutes Intravenous Every 8 hours 05/29/17 2031 06/03/17 0832   05/29/17 2100  metroNIDAZOLE (FLAGYL) IVPB 500 mg  Status:  Discontinued     500 mg 100 mL/hr over 60 Minutes Intravenous Every 8 hours 05/29/17 2029 06/03/17 0832   05/26/17 2200  ceFAZolin (ANCEF) IVPB 1 g/50 mL premix  Status:  Discontinued     1 g 100 mL/hr over 30 Minutes Intravenous Every 8 hours 05/26/17 1008 05/29/17 2023   05/25/17 1100  vancomycin (VANCOCIN) 1,250 mg in sodium chloride 0.9 % 250 mL IVPB  Status:  Discontinued     1,250 mg 166.7 mL/hr over 90 Minutes Intravenous Every 12 hours 05/25/17 0955 05/26/17 0946   05/22/17 1400  anidulafungin (ERAXIS) 100 mg in sodium chloride 0.9 % 100 mL IVPB     100 mg 78 mL/hr over 100 Minutes Intravenous Every 24 hours 05/21/17 1330 05/27/17 1740   05/21/17 1400  anidulafungin (ERAXIS) 200 mg in sodium chloride 0.9 % 200 mL IVPB     200 mg 78 mL/hr over 200  Minutes Intravenous  Once 05/21/17 1330 05/21/17 1940   05/15/17 1000  anidulafungin (ERAXIS) 100 mg in sodium chloride 0.9 % 100 mL IVPB  Status:  Discontinued     100 mg 78 mL/hr over 100 Minutes Intravenous Every 24 hours 05/14/17 0829 05/16/17 0901   05/15/17 1000  metroNIDAZOLE (FLAGYL) IVPB 500 mg  Status:  Discontinued     500 mg 100 mL/hr over 60 Minutes Intravenous Every 8 hours 05/15/17 0903 05/25/17 0938   05/14/17 0900  anidulafungin (ERAXIS) 200 mg in sodium chloride 0.9 % 200 mL IVPB     200 mg 78 mL/hr over 200 Minutes Intravenous  Once 05/14/17 0829 05/14/17 1325   05/13/17 1927  vancomycin (VANCOCIN) 1-5 GM/200ML-% IVPB    Comments:  Ward, Christa   : cabinet override      05/13/17 1927 05/14/17 0744   05/12/17 1400  ceFEPIme (MAXIPIME) 1 g in dextrose 5 % 50 mL IVPB     1 g 100 mL/hr over 30 Minutes Intravenous Every 8 hours 05/12/17 0939 05/26/17 2359   05/12/17 0900  vancomycin (VANCOCIN) IVPB 1000 mg/200 mL premix  Status:  Discontinued     1,000 mg 200 mL/hr over 60 Minutes Intravenous Every 12 hours 05/12/17 0750 05/16/17 0852   05/12/17 0830  aztreonam (AZACTAM) 2 GM IVPB     2 g 100 mL/hr over 30 Minutes Intravenous  Once 05/12/17 0800 05/12/17 0957   05/06/17 0916  vancomycin (VANCOCIN) IVPB 1000 mg/200 mL premix     1,000 mg 200 mL/hr over 60 Minutes Intravenous On call to O.R. 05/06/17 0916 05/06/17 1229       Assessment/Plan Adrenal mass/hx of prior hemicolectomy/ileocolic anastomosis 2355 1. S/p laparoscopic converted to open right adrenalectomy, small bowel resection with anastomosis, placement of 20cm^2 wound vac, 05/06/17, Dr. Lurena Joiner [name] 2. Post op wound dehiscence - primary repair of small bowel anastomotic leak, abdominal closure of wound dehisence, 05/13/17, Luke [name] 3. S/p reopening of recent laparotomy, lysis of adhesions, small bowel resection, placement 200cm^2 negative pressure dressing, 05/14/17, Luke [name] 4. S/p reopening  of recent laparotomy, creation of tube ileostomy, placement of 100cm2 negative pressure dressing, 05/17/17, Luke [name] 5. S/p abdominal wash out, placement of mesh for abdominal closure, placement of wound vac of 100cm^2, 05/20/17, Luke [name] 6. S/p Bronchoscopy 05/22/17 Dr. Simonne Maffucci EC fistula - noted on exam 06/13/17 Acute respiratory failure/ post op HCAP pneumonia/post op pleural effusion - CCM/extubated 05/28/17 COPD/Tobacco use  Malnutrition - PICC/TNA 05/14/17. Prealbumin 11.5 (9/24) HTN Anemia Anxiety Hypothyroidism Chronic pain medicine use: Prior to  admission he took about 40 mg oxycodone per day - goes to pain clinic - sees Dr. [name] for neck  FEN: NPO/ice chips/TNA ID: none currentlyo DVT: Lovenox  Plan:  Continue bowel rest and TNA. Vac changes MWF.  OR scheduled for 10/5 with Dr. Hassell Done   LOS: 74 days    Wellington [place] , [facility] Surgery 07/21/2017, 8:02 AM Pager: [phone] Consults: [phone] Mon-Fri 7:00 am-4:30 pm Sat-Sun 7:00 am-11:30 am

## 2017-07-21 NOTE — Progress Notes (Addendum)
[place] NOTE   Pharmacy Consult for TPN Indication: prolonged ileus, small bowel leak, multiple bowel surgeries  Patient Measurements: Body mass index is 25.43 kg/m. Filed Weights   07/19/17 0500 07/20/17 0500 07/21/17 0500  Weight: 173 lb 15.1 oz (78.9 kg) 174 lb 9.7 oz (79.2 kg) 177 lb 4 oz (80.4 kg)   HPI: 42 yoM admitted on 7/11 for enlarging adrenal mass and planned adrenalectomy.  Pharmacy consulted to dose TPN.  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   7/22 OR:  wash out, tube ileostomy, left with open abdomen & wound vac in place.  Plan for OR on 7/25 for closure 7/25 OR: mesh placed for abdominal closure, wound vac 8/2 extubated, propofol drip off 8/4 TPN off for ~ 5 hours due to line detached from filter 8/10 MD request decrease rate x 1 week 2nd low sodium 8/12 Additional sodium added to TPN due to persistent hyponatremia 8/16 Increasing rate back to goal since sodium has improved with above intervention.   8/20 Prealbumin decreased.  New apparent enteric leak per surgery notes. 8/21 CaPhos product elevated, remove electrolytes from TPN 8/25 PICC did not have blood return in 2 of the 3 ports.  Alteplase intra-catheter and function returned.  TPN infusing correctly on 8/26 per RN. 8/28: d/c SSI 8/30: added electrolytes to TPN 8/31: TPN IV line came out around ~0340 and current bag was stopped at this time.  Restart new clinimix 5/15 1L bag (no lytes since phos is elevated this morning) until next bag is [facility] at [facility]. Will not add MVI and trace elements to this 1L bag.  Use electrolyte free clinimix with [facility] bag at [facility] 9/1 NGT out 9/3 added electrolytes to TPN 9/4 lytes out of TPN 9/7 cycle over 18 hrs 9/8 add back electrolytes to TPN and cycle over 14 hours 9/9 lytes out of TPN, continue 14 hr cycle, DC SSI/CBGs 9/13 electrolytes added back to TPN 9/14 remove electrolytes from TPN  Plan fistula-repair surgery on 10/5  Insulin requirements past 24 hours: none (no hx DM)  Current Nutrition: NPO x ice chips, popsicles 9/5  IVF: none. Lasix 20 IV daily changed to every other day 9/20  Central access: PICC line ordered 7/19, placed on 7/20 TPN start date: 7/20  ASSESSMENT                                                                                                           Today, 07/21/17  - Glucose (goal <150): at goal -  Electrolytes:  K 3.5 (goal >/= 4 for ileus) Mag down 1.5 (goal >/= 2 for ileus), remains low despite daily aggressive replacement.  Na 139 - normalized after increasing sodium in  TPN. Phos 4.8 - with electrolytes removed from TPN, CorrCa 10.14, Ca/phos product <55 (goal < 55) - Renal:  SCr wnl - LFTs: 9/20 labs WNL except Alk Phos elevated but down at 151   - TGs:   206 (9/3), 227 (9/10), 195 (9/16), 193 (9/24) - Prealbumin:  <5 (7/20), 6.1 (7/24), 10.1 (7/30) 24.8 (8/6), 26.4 (8/13), 15.9 (8/20), 19.7 (8/27) 24.7 (9/3) 23.7 (9/10) 18.5 (9/17), 11.5 (9/24)  NUTRITIONAL GOALS                                                                                             RD recs (9/24):  122-138 gms Protein (1.5 - 1.7 g/kg) 2030 - 2270 Kcal  Recalculation: Clinimix 5/15 over 14h cycle with lipids 3x/week on MWF only would provide: 132g protein and avg 2080 kcal/day   PLAN      Now:  - Patient's magnesium level continues to be low despite repleting with high intermittent Mag Sulfate doses.  Will give magnesium sulfate 8 gm IV, infuse over 6 hrs today to see if extended infusion will help with absorption. - KCl 37mq IV x6  At  1800 today: ? Clinimix 5/15 (electrolyte-free formulation) for total of 26439mday to cycle over 14hrs ? Add sodium to TPN (add 125 mEq of Na/L-- 70 mEq/L NaCl and 55 mEq/L Na Acetate) for hyponatremia ? Lipids at 2026mhr x 12 hours 3 times weekly on MWF  MVI/TE in TPN and pepcid 40 mg/day in TPN  TPN lab panels on Mondays & Thursdays.  BMET, Mag & Phos daily ordered.  AnhDia SitterharmD, BCPS 07/21/2017 7:13 AM

## 2017-07-21 NOTE — Progress Notes (Signed)
Physical Therapy Treatment Patient Details Name: [name] [name] MRN: [id number] DOB: 1961/03/19 Today's Date: 07/21/2017    History of Present Illness  56 y.o. 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 On 7/18, he developed a leak therefore was taken back to OR for re-do of ex-lap and repair of leak on 05/20/17.  He returned to the ICU on the vent . Extubated on 05/28/17.     PT Comments    Assisted pt OOB with increased time to amb a greater distance in hallway while monitoring HR and pain level.    Follow Up Recommendations  SNF;Supervision/Assistance - 24 hour     Equipment Recommendations       Recommendations for Other Services Rehab consult     Precautions / Restrictions Precautions Precautions: Fall Precaution Comments: multiple lines in abdomen, VAC, 2 ABD drains , and JP drain Restrictions Weight Bearing Restrictions: No    Mobility  Bed Mobility Overal bed mobility: Needs Assistance Bed Mobility: Supine to Sit     Supine to sit: Min guard;Min assist     General bed mobility comments: increased time and assist with multiple lines and upper body   Transfers Overall transfer level: Needs assistance Equipment used: Rolling walker (2 wheeled) Transfers: Sit to/from Stand Sit to Stand: Supervision;Min guard Stand pivot transfers: Supervision;Min guard       General transfer comment: watched lines, cues for UE placement esp for stand to sit to reach back prior to sit  Ambulation/Gait Ambulation/Gait assistance: Min guard Ambulation Distance (Feet): 1550 Feet Assistive device: Rolling walker (2 wheeled) Gait Pattern/deviations: Step-through pattern;Decreased step length - right;Decreased step length - left;Shuffle;Trunk flexed;Narrow base of support     General Gait  Details: tolerated an increased distance     Avg HR 128   Stairs            Wheelchair Mobility    Modified Rankin (Stroke Patients Only)       Balance                                            Cognition Arousal/Alertness: Awake/alert Behavior During Therapy: WFL for tasks assessed/performed Overall Cognitive Status: Within Functional Limits for tasks assessed                                 General Comments: pleasant/cooperative/motivated      Exercises      General Comments        Pertinent Vitals/Pain Pain Assessment: Faces Faces Pain Scale: Hurts a little bit Pain Location: ABD VAC location Pain Descriptors / Indicators: Grimacing Pain Intervention(s): Monitored during session;Repositioned;Patient requesting pain meds-RN notified    Home Living                      Prior Function            PT Goals (current goals can now be found in the care plan section) Progress towards PT goals: Progressing toward goals    Frequency    Min 3X/week      PT Plan Current plan remains appropriate    Co-evaluation  AM-PAC PT "6 Clicks" Daily Activity  Outcome Measure  Difficulty turning over in bed (including adjusting bedclothes, sheets and blankets)?: A Little Difficulty moving from lying on back to sitting on the side of the bed? : A Lot Difficulty sitting down on and standing up from a chair with arms (e.g., wheelchair, bedside commode, etc,.)?: A Lot Help needed moving to and from a bed to chair (including a wheelchair)?: A Little Help needed walking in hospital room?: A Little Help needed climbing 3-5 steps with a railing? : A Lot 6 Click Score: 15    End of Session Equipment Utilized During Treatment: Gait belt Activity Tolerance: Patient tolerated treatment well Patient left: in chair;with call bell/phone within reach Nurse Communication: Mobility status PT Visit Diagnosis:  Difficulty in walking, not elsewhere classified (R26.2);Muscle weakness (generalized) (M62.81)     Time: 1300-1330 PT Time Calculation (min) (ACUTE ONLY): 30 min  Charges:  $Gait Training: 8-22 mins $Therapeutic Activity: 8-22 mins                    G Codes:       [name]  PTA [facility]  Acute  Rehab Pager      [phone]

## 2017-07-22 LAB — BASIC METABOLIC PANEL
ANION GAP: 6 (ref 5–15)
BUN: 20 mg/dL (ref 6–20)
CALCIUM: 8.6 mg/dL — AB (ref 8.9–10.3)
CO2: 27 mmol/L (ref 22–32)
Chloride: 105 mmol/L (ref 101–111)
Creatinine, Ser: 0.65 mg/dL (ref 0.61–1.24)
GFR calc non Af Amer: >60 mL/min (ref >60)
GLUCOSE: 121 mg/dL — AB (ref 65–99)
POTASSIUM: 3.6 mmol/L (ref 3.5–5.1)
Sodium: 138 mmol/L (ref 135–145)

## 2017-07-22 LAB — MAGNESIUM: Magnesium: 1.6 mg/dL — ABNORMAL LOW (ref 1.7–2.4)

## 2017-07-22 LAB — PHOSPHORUS: Phosphorus: 4.6 mg/dL (ref 2.5–4.6)

## 2017-07-22 MED ORDER — FAT EMULSION 20 % IV EMUL
240.0000 mL | INTRAVENOUS | Status: AC
  Administered 2017-07-22: 240 mL via INTRAVENOUS
  Filled 2017-07-22: qty 250

## 2017-07-22 MED ORDER — TRACE MINERALS CR-CU-MN-SE-ZN 10-1000-500-60 MCG/ML IV SOLN
INTRAVENOUS | Status: AC
  Administered 2017-07-22: 18:00:00 via INTRAVENOUS
  Filled 2017-07-22: qty 2000

## 2017-07-22 MED ORDER — MAGNESIUM SULFATE 4 GM/100ML IV SOLN
4.0000 g | INTRAVENOUS | Status: AC
  Administered 2017-07-22 (×3): 4 g via INTRAVENOUS
  Filled 2017-07-22 (×3): qty 100

## 2017-07-22 MED ORDER — POTASSIUM CHLORIDE 10 MEQ/50ML IV SOLN
10.0000 meq | INTRAVENOUS | Status: AC
  Administered 2017-07-22 (×6): 10 meq via INTRAVENOUS
  Filled 2017-07-22 (×9): qty 50

## 2017-07-22 NOTE — Progress Notes (Signed)
[facility] NOTE   Pharmacy Consult for TPN Indication: prolonged ileus, small bowel leak, multiple bowel surgeries  Patient Measurements: Body mass index is 25.81 kg/m. Filed Weights   07/20/17 0500 07/21/17 0500 07/22/17 0548  Weight: 174 lb 9.7 oz (79.2 kg) 177 lb 4 oz (80.4 kg) 179 lb 14.3 oz (81.6 kg)   HPI: 53 yoM admitted on 7/11 for enlarging adrenal mass and planned adrenalectomy.  Pharmacy consulted to dose TPN.  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   7/22 OR:  wash out, tube ileostomy, left with open abdomen & wound vac in place.  Plan for OR on 7/25 for closure 7/25 OR: mesh placed for abdominal closure, wound vac 8/2 extubated, propofol drip off 8/4 TPN off for ~ 5 hours due to line detached from filter 8/10 MD request decrease rate x 1 week 2nd low sodium 8/12 Additional sodium added to TPN due to persistent hyponatremia 8/16 Increasing rate back to goal since sodium has improved with above intervention.   8/20 Prealbumin decreased.  New apparent enteric leak per surgery notes. 8/21 CaPhos product elevated, remove electrolytes from TPN 8/25 PICC did not have blood return in 2 of the 3 ports.  Alteplase intra-catheter and function returned.  TPN infusing correctly on 8/26 per RN. 8/28: d/c SSI 8/30: added electrolytes to TPN 8/31: TPN IV line came out around ~0340 and current bag was stopped at this time.  Restart new clinimix 5/15 1L bag (no lytes since phos is elevated this morning) until next bag is due at [facility]. Will not add MVI and trace elements to this 1L bag.  Use electrolyte free clinimix with new bag at [facility] 9/1 NGT out 9/3 added electrolytes to TPN 9/4 lytes out of TPN 9/7 cycle over 18 hrs 9/8 add back electrolytes to TPN and cycle over 14 hours 9/9 lytes out of TPN, continue 14 hr cycle, DC SSI/CBGs 9/13 electrolytes added back to TPN 9/14 remove electrolytes from TPN  Plan fistula-repair surgery on 10/5  Insulin requirements past 24 hours: none (no hx DM)  Current Nutrition: NPO x ice chips, popsicles 9/5  IVF: none. Lasix 20 IV daily changed to every other day 9/20  Central access: PICC line ordered 7/19, placed on 7/20 TPN start date: 7/20  ASSESSMENT                                                                                                           Today, 07/22/17  - Glucose (goal <150): at goal -  Electrolytes:  K 3.6 (goal >/= 4 for ileus) Mag 1.6 (goal >/= 2 for ileus), remains low despite daily aggressive replacement.  Na 138 - normalized after increasing sodium in TPN.  Phos 4.6 - with electrolytes removed from TPN, CorrCa 10.04, Ca/phos product <55 (goal < 55) - Renal:  SCr wnl - LFTs: 9/24 labs WNL except Alk Phos elevated at 151   - TGs:   206 (9/3), 227 (9/10), 195 (9/16), 193 (9/24) - Prealbumin:  <5 (7/20), 6.1 (7/24), 10.1 (7/30) 24.8 (8/6), 26.4 (8/13), 15.9 (8/20), 19.7 (8/27) 24.7 (9/3) 23.7 (9/10) 18.5 (9/17), 11.5 (9/24)  NUTRITIONAL GOALS                                                                                             RD recs (9/24):  122-138 gms Protein (1.5 - 1.7 g/kg) 2030 - 2270 Kcal  Recalculation: Clinimix 5/15 over 14h cycle with lipids 3x/week on MWF only would provide: 132g protein and avg 2080 kcal/day   PLAN      This morning: - Mag Sulfate 4g IV x 3 doses today - KCl 33mq IV x 6   At 1800 today: ? Clinimix 5/15 (electrolyte-free formulation) for total of 26456mday to cycle over 14hrs ? Add sodium to TPN (add 125 mEq of Na/L-- 70 mEq/L NaCl and 55 mEq/L Na Acetate) for  hyponatremia ? Lipids at 2013mhr x 12 hours 3 times weekly on MWF  MVI/TE in TPN and pepcid 40 mg/day in TPN  TPN lab panels on Mondays & Thursdays.  BMET, Mag & Phos daily ordered.  AmaHershal CoriaharmD, BCPS Pager: [phone]/2018 9:13 AM

## 2017-07-22 NOTE — Progress Notes (Signed)
Patient ID: [name] [name], male   DOB: Aug 21, 1961, 56 y.o.   MRN: [id number]  [facility] Surgery Progress Note  63 Days Post-Op  Subjective: CC- abdominal pain Sitting up in bed washing up. Patient states that a lot of his abdominal pain has improved since vac change yesterday. Denies any current abdominal pain. No new complaints.  Objective: Vital signs in last 24 hours: Temp:  [97.6 F (36.4 C)-98.4 F (36.9 C)] 98.2 F (36.8 C) (09/26 0311) Pulse Rate:  [85-91] 91 (09/26 0400) Resp:  [14-18] 15 (09/26 0400) BP: (89-111)/(61-68) 98/62 (09/26 0400) SpO2:  [96 %-98 %] 98 % (09/26 0400) Weight:  [179 lb 14.3 oz (81.6 kg)] 179 lb 14.3 oz (81.6 kg) (09/26 0548) Last BM Date: 07/15/17  Intake/Output from previous day: 09/25 0701 - 09/26 0700 In: 283 [I.V.:233; IV Piggyback:50] Out: 2075 [Urine:1075; Drains:1000] Intake/Output this shift: Total I/O In: -  Out: 775 [Urine:775]  PE: Gen: Alert, NAD, pleasant HEENT: EOMs intact, pupils equal and round Pulm: CTAB, effort normal Cardio: RRR Abd: soft, ND, few BS heard, wound vac in place RUQ, two foleys in place and draining  Lab Results:   Recent Labs  07/20/17 0500  WBC 8.8  HGB 8.2*  HCT 25.3*  PLT 606*   BMET  Recent Labs  07/21/17 0349 07/22/17 0523  NA 139 138  K 3.5 3.6  CL 103 105  CO2 28 27  GLUCOSE 122* 121*  BUN 21* 20  CREATININE 0.68 0.65  CALCIUM 8.7* 8.6*   PT/INR No results for input(s): LABPROT, INR in the last 72 hours. CMP     Component Value Date/Time   NA 138 07/22/2017 0523   K 3.6 07/22/2017 0523   CL 105 07/22/2017 0523   CO2 27 07/22/2017 0523   GLUCOSE 121 (H) 07/22/2017 0523   BUN 20 07/22/2017 0523   CREATININE 0.65 07/22/2017 0523   CREATININE 0.85 07/22/2013 1021   CALCIUM 8.6 (L) 07/22/2017 0523   PROT 6.1 (L) 07/20/2017 0500   ALBUMIN 2.2 (L) 07/20/2017 0500   AST 18 07/20/2017 0500   ALT 17 07/20/2017 0500   ALKPHOS 151 (H) 07/20/2017 0500   BILITOT 0.5  07/20/2017 0500   GFRNONAA >60 07/22/2017 0523   GFRAA >60 07/22/2017 0523   Lipase     Component Value Date/Time   LIPASE 27 09/15/2015 0934       Studies/Results: No results found.  Anti-infectives: Anti-infectives    Start     Dose/Rate Route Frequency Ordered Stop   07/08/17 1600  fluconazole (DIFLUCAN) IVPB 100 mg     100 mg 50 mL/hr over 60 Minutes Intravenous Every 24 hours 07/08/17 0828 07/14/17 1710   07/01/17 1500  fluconazole (DIFLUCAN) IVPB 100 mg     100 mg 50 mL/hr over 60 Minutes Intravenous Every 24 hours 07/01/17 1401 07/07/17 1715   06/16/17 1200  piperacillin-tazobactam (ZOSYN) IVPB 3.375 g     3.375 g 12.5 mL/hr over 240 Minutes Intravenous Every 8 hours 06/16/17 1026 06/17/17 0004   06/08/17 1200  piperacillin-tazobactam (ZOSYN) IVPB 3.375 g  Status:  Discontinued     3.375 g 12.5 mL/hr over 240 Minutes Intravenous Every 8 hours 06/08/17 1056 06/16/17 1026   05/29/17 2200  ceFEPIme (MAXIPIME) 2 g in dextrose 5 % 50 mL IVPB  Status:  Discontinued     2 g 100 mL/hr over 30 Minutes Intravenous Every 8 hours 05/29/17 2031 06/03/17 0832   05/29/17 2100  metroNIDAZOLE (FLAGYL) IVPB  500 mg  Status:  Discontinued     500 mg 100 mL/hr over 60 Minutes Intravenous Every 8 hours 05/29/17 2029 06/03/17 0832   05/26/17 2200  ceFAZolin (ANCEF) IVPB 1 g/50 mL premix  Status:  Discontinued     1 g 100 mL/hr over 30 Minutes Intravenous Every 8 hours 05/26/17 1008 05/29/17 2023   05/25/17 1100  vancomycin (VANCOCIN) 1,250 mg in sodium chloride 0.9 % 250 mL IVPB  Status:  Discontinued     1,250 mg 166.7 mL/hr over 90 Minutes Intravenous Every 12 hours 05/25/17 0955 05/26/17 0946   05/22/17 1400  anidulafungin (ERAXIS) 100 mg in sodium chloride 0.9 % 100 mL IVPB     100 mg 78 mL/hr over 100 Minutes Intravenous Every 24 hours 05/21/17 1330 05/27/17 1740   05/21/17 1400  anidulafungin (ERAXIS) 200 mg in sodium chloride 0.9 % 200 mL IVPB     200 mg 78 mL/hr over 200  Minutes Intravenous  Once 05/21/17 1330 05/21/17 1940   05/15/17 1000  anidulafungin (ERAXIS) 100 mg in sodium chloride 0.9 % 100 mL IVPB  Status:  Discontinued     100 mg 78 mL/hr over 100 Minutes Intravenous Every 24 hours 05/14/17 0829 05/16/17 0901   05/15/17 1000  metroNIDAZOLE (FLAGYL) IVPB 500 mg  Status:  Discontinued     500 mg 100 mL/hr over 60 Minutes Intravenous Every 8 hours 05/15/17 0903 05/25/17 0938   05/14/17 0900  anidulafungin (ERAXIS) 200 mg in sodium chloride 0.9 % 200 mL IVPB     200 mg 78 mL/hr over 200 Minutes Intravenous  Once 05/14/17 0829 05/14/17 1325   05/13/17 1927  vancomycin (VANCOCIN) 1-5 GM/200ML-% IVPB    Comments:  Ward, Christa   : cabinet override      05/13/17 1927 05/14/17 0744   05/12/17 1400  ceFEPIme (MAXIPIME) 1 g in dextrose 5 % 50 mL IVPB     1 g 100 mL/hr over 30 Minutes Intravenous Every 8 hours 05/12/17 0939 05/26/17 2359   05/12/17 0900  vancomycin (VANCOCIN) IVPB 1000 mg/200 mL premix  Status:  Discontinued     1,000 mg 200 mL/hr over 60 Minutes Intravenous Every 12 hours 05/12/17 0750 05/16/17 0852   05/12/17 0830  aztreonam (AZACTAM) 2 GM IVPB     2 g 100 mL/hr over 30 Minutes Intravenous  Once 05/12/17 0800 05/12/17 0957   05/06/17 0916  vancomycin (VANCOCIN) IVPB 1000 mg/200 mL premix     1,000 mg 200 mL/hr over 60 Minutes Intravenous On call to O.R. 05/06/17 0916 05/06/17 1229       Assessment/Plan Adrenal mass/hx of prior hemicolectomy/ileocolic anastomosis 1245 1. S/p laparoscopic converted to open right adrenalectomy, small bowel resection with anastomosis, placement of 20cm^2 wound vac, 05/06/17, Dr. Lurena Joiner [name] 2. Post op wound dehiscence - primary repair of small bowel anastomotic leak, abdominal closure of wound dehisence, 05/13/17, Luke [name] 3. S/p reopening of recent laparotomy, lysis of adhesions, small bowel resection, placement 200cm^2 negative pressure dressing, 05/14/17, Luke [name] 4. S/p reopening  of recent laparotomy, creation of tube ileostomy, placement of 100cm2 negative pressure dressing, 05/17/17, Luke [name] 5. S/p abdominal wash out, placement of mesh for abdominal closure, placement of wound vac of 100cm^2, 05/20/17, Luke [name] 6. S/p Bronchoscopy 05/22/17 Dr. Simonne Maffucci EC fistula - noted on exam 06/13/17 Acute respiratory failure/ post op HCAP pneumonia/post op pleural effusion - CCM/extubated 05/28/17 COPD/Tobacco use  Malnutrition - PICC/TNA 05/14/17. Prealbumin 11.5 (9/24) HTN Anemia Anxiety Hypothyroidism Chronic  pain medicine use: Prior to admission he took about 40 mg oxycodone per day - goes to pain clinic - sees Dr. [name] for neck  FEN: NPO/ice chips/TNA ID: none currently none DVT: Lovenox  Plan: Continue bowel rest and TNA. Vac changed yesterday, will omit change today and return to MWF schedule.  OR scheduled for 10/5 with Dr. Hassell Done   LOS: 18 days    Wellington Hampshire , [facility] Surgery 07/22/2017, 10:35 AM Pager: [phone] Consults: [phone] Mon-Fri 7:00 am-4:30 pm Sat-Sun 7:00 am-11:30 am

## 2017-07-22 NOTE — Progress Notes (Signed)
OT Cancellation Note  Patient Details Name: [name] [name] MRN: [id number] DOB: 1961-06-15   Cancelled Treatment:    Reason Eval/Treat Not Completed: Fatigue/lethargy limiting ability to participate  Betsy Pries 07/22/2017, 4:17 PM

## 2017-07-22 NOTE — Progress Notes (Signed)
Patient ID: [name] [name], male   DOB: [birth year], 56 y.o.   MRN: [id number]  [place] Nurse changed NPWT dressing change 07/21/17 at patient and family request (two consecutive days) due to discomfort. Ok to omit dressing change today and Thursday and plan to change the NPWT device on Friday to return to the M/W/F schedule.  BROOKE A MEUTH

## 2017-07-22 NOTE — Progress Notes (Signed)
[place] nurse team notified by [facility] PA ok to omit NPWT dressing change on Wednesday and change again on Friday to allow for M/W/F dressing change schedule.  Orders updated appropriately.   [place], [place], [place]

## 2017-07-22 NOTE — Progress Notes (Signed)
   07/22/17 1500  Clinical Encounter Type  Visited With Patient and family together  Visit Type Follow-up  Spiritual Encounters  Spiritual Needs Emotional;Prayer   Following up on previous visits with the patient.  He reported he had a chance to go outside today.  Wife was with him and continues to be very supportive.  Patient was very sleepy and I will follow up again. Chaplain Katherene Ponto

## 2017-07-22 NOTE — Progress Notes (Signed)
Physical Therapy Treatment Patient Details Name: [name] [name] MRN: [id number] DOB: 05-28-1961 Today's Date: 07/22/2017    History of Present Illness  56 y.o. 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 On 7/18, he developed a leak therefore was taken back to OR for re-do of ex-lap and repair of leak on 05/20/17.  He returned to the ICU on the vent . Extubated on 05/28/17.     PT Comments    Assisted OOB to amb around unit 2 laps while monitoring vitals.  After amb, pt c/o increased R UQ ABD pain.  Pain meds requested.    Follow Up Recommendations  SNF;Supervision/Assistance - 24 hour     Equipment Recommendations  Rolling walker with 5" wheels    Recommendations for Other Services       Precautions / Restrictions Precautions Precautions: Fall Precaution Comments: multiple lines in abdomen, VAC, 2 ABD drains , and JP drain Restrictions Weight Bearing Restrictions: No    Mobility  Bed Mobility Overal bed mobility: Needs Assistance Bed Mobility: Supine to Sit     Supine to sit: Min guard;Min assist     General bed mobility comments: increased time and assist with multiple lines and upper body   Transfers Overall transfer level: Needs assistance Equipment used: Rolling walker (2 wheeled) Transfers: Sit to/from Stand Sit to Stand: Supervision;Min guard Stand pivot transfers: Supervision;Min guard       General transfer comment: watched lines, cues for UE placement esp for stand to sit to reach back prior to sit  Ambulation/Gait Ambulation/Gait assistance: Supervision;Min guard Ambulation Distance (Feet): 1250 Feet Assistive device: Rolling walker (2 wheeled) Gait Pattern/deviations: Step-through pattern;Decreased step length - right;Decreased step length - left;Shuffle;Trunk  flexed;Narrow base of support Gait velocity: WFL   General Gait Details: 2 lap around unit while monitoring vitals   Stairs            Wheelchair Mobility    Modified Rankin (Stroke Patients Only)       Balance                                            Cognition Arousal/Alertness: Awake/alert Behavior During Therapy: WFL for tasks assessed/performed Overall Cognitive Status: Within Functional Limits for tasks assessed                                 General Comments: pleasant/cooperative/motivated      Exercises      General Comments        Pertinent Vitals/Pain Pain Assessment: 0-10 Pain Score: 7  Pain Location: ABD VAC location Pain Descriptors / Indicators: Grimacing Pain Intervention(s): Monitored during session;Repositioned;Patient requesting pain meds-RN notified    Home Living                      Prior Function            PT Goals (current goals can now be found in the care plan section) Progress towards PT goals: Progressing toward goals    Frequency    Min 3X/week      PT Plan Current plan remains appropriate    Co-evaluation PT/OT/SLP Co-Evaluation/Treatment:  Yes            AM-PAC PT "6 Clicks" Daily Activity  Outcome Measure  Difficulty turning over in bed (including adjusting bedclothes, sheets and blankets)?: A Little Difficulty moving from lying on back to sitting on the side of the bed? : A Lot Difficulty sitting down on and standing up from a chair with arms (e.g., wheelchair, bedside commode, etc,.)?: A Lot Help needed moving to and from a bed to chair (including a wheelchair)?: A Little Help needed walking in hospital room?: A Little Help needed climbing 3-5 steps with a railing? : A Lot 6 Click Score: 15    End of Session Equipment Utilized During Treatment: Gait belt Activity Tolerance: Patient tolerated treatment well Patient left: in chair;with call bell/phone  within reach Nurse Communication: Mobility status PT Visit Diagnosis: Difficulty in walking, not elsewhere classified (R26.2);Muscle weakness (generalized) (M62.81)     Time: 6812-7517 PT Time Calculation (min) (ACUTE ONLY): 30 min  Charges:  $Gait Training: 8-22 mins $Therapeutic Activity: 8-22 mins                    G Codes:       [name]  PTA [facility]  Acute  Rehab Pager      [phone]

## 2017-07-23 LAB — COMPREHENSIVE METABOLIC PANEL
ALT: 18 U/L (ref 17–63)
ANION GAP: 7 (ref 5–15)
AST: 21 U/L (ref 15–41)
Albumin: 2.2 g/dL — ABNORMAL LOW (ref 3.5–5.0)
Alkaline Phosphatase: 141 U/L — ABNORMAL HIGH (ref 38–126)
BILIRUBIN TOTAL: 0.2 mg/dL — AB (ref 0.3–1.2)
BUN: 24 mg/dL — AB (ref 6–20)
CALCIUM: 8.7 mg/dL — AB (ref 8.9–10.3)
CO2: 26 mmol/L (ref 22–32)
Chloride: 105 mmol/L (ref 101–111)
Creatinine, Ser: 0.74 mg/dL (ref 0.61–1.24)
GFR calc Af Amer: >60 mL/min (ref >60)
GFR calc non Af Amer: >60 mL/min (ref >60)
Glucose, Bld: 109 mg/dL — ABNORMAL HIGH (ref 65–99)
POTASSIUM: 4.1 mmol/L (ref 3.5–5.1)
Sodium: 138 mmol/L (ref 135–145)
TOTAL PROTEIN: 6 g/dL — AB (ref 6.5–8.1)

## 2017-07-23 LAB — PHOSPHORUS: PHOSPHORUS: 4.5 mg/dL (ref 2.5–4.6)

## 2017-07-23 LAB — MAGNESIUM: Magnesium: 1.8 mg/dL (ref 1.7–2.4)

## 2017-07-23 MED ORDER — TRACE MINERALS CR-CU-MN-SE-ZN 10-1000-500-60 MCG/ML IV SOLN
INTRAVENOUS | Status: AC
  Administered 2017-07-23: 18:00:00 via INTRAVENOUS
  Filled 2017-07-23: qty 2644
  Filled 2017-07-23: qty 2000

## 2017-07-23 MED ORDER — MAGNESIUM SULFATE 50 % IJ SOLN
8.0000 g | Freq: Once | INTRAVENOUS | Status: AC
  Administered 2017-07-23: 8 g via INTRAVENOUS
  Filled 2017-07-23: qty 16

## 2017-07-23 NOTE — Progress Notes (Signed)
Physical Therapy Treatment Patient Details Name: [name] [name] MRN: [id number] DOB: 1961-06-08 Today's Date: 07/23/2017    History of Present Illness  56 y.o. 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 On 7/18, he developed a leak therefore was taken back to OR for re-do of ex-lap and repair of leak on 05/20/17.  He returned to the ICU on the vent . Extubated on 05/28/17.     PT Comments    Assisted OOB to amb a greater distance in hallway.  Pt progressing slolwy  Follow Up Recommendations  SNF;Supervision/Assistance - 24 hour     Equipment Recommendations       Recommendations for Other Services       Precautions / Restrictions Precautions Precautions: Fall Precaution Comments: multiple lines in abdomen, VAC, 2 ABD drains , and JP drain Restrictions Weight Bearing Restrictions: No    Mobility  Bed Mobility Overal bed mobility: Needs Assistance Bed Mobility: Supine to Sit     Supine to sit: Supervision;Min guard     General bed mobility comments: increased time and assist with multiple lines and upper body   Transfers Overall transfer level: Needs assistance Equipment used: Rolling walker (2 wheeled) Transfers: Sit to/from Stand Sit to Stand: Supervision;Min guard Stand pivot transfers: Supervision;Min guard       General transfer comment: watched lines, cues for UE placement esp for stand to sit to reach back prior to sit  Ambulation/Gait Ambulation/Gait assistance: Supervision;Min guard Ambulation Distance (Feet): 1550 Feet Assistive device: Rolling walker (2 wheeled) Gait Pattern/deviations: Step-through pattern;Decreased step length - right;Decreased step length - left;Shuffle;Trunk flexed;Narrow base of support     General Gait Details: 3 lap around unit while monitoring  vitals   Stairs            Wheelchair Mobility    Modified Rankin (Stroke Patients Only)       Balance                                            Cognition Arousal/Alertness: Awake/alert Behavior During Therapy: WFL for tasks assessed/performed Overall Cognitive Status: Within Functional Limits for tasks assessed                                 General Comments: pleasant/cooperative/motivated      Exercises      General Comments        Pertinent Vitals/Pain Pain Assessment: 0-10 Pain Score: 2  Pain Location: ABD VAC location Pain Descriptors / Indicators: Grimacing Pain Intervention(s): Monitored during session    Home Living                      Prior Function            PT Goals (current goals can now be found in the care plan section) Progress towards PT goals: Progressing toward goals    Frequency    Min 3X/week      PT Plan Current plan remains appropriate    Co-evaluation              AM-PAC PT "6 Clicks" Daily Activity  Outcome Measure  Difficulty turning  over in bed (including adjusting bedclothes, sheets and blankets)?: A Little Difficulty moving from lying on back to sitting on the side of the bed? : A Little Difficulty sitting down on and standing up from a chair with arms (e.g., wheelchair, bedside commode, etc,.)?: A Little Help needed moving to and from a bed to chair (including a wheelchair)?: A Little Help needed walking in hospital room?: A Little Help needed climbing 3-5 steps with a railing? : A Little 6 Click Score: 18    End of Session Equipment Utilized During Treatment: Gait belt Activity Tolerance: Patient tolerated treatment well Patient left: in chair;with call bell/phone within reach Nurse Communication: Mobility status PT Visit Diagnosis: Difficulty in walking, not elsewhere classified (R26.2);Muscle weakness (generalized) (M62.81)     Time: 0630-1601 PT  Time Calculation (min) (ACUTE ONLY): 23 min  Charges:  $Gait Training: 8-22 mins $Therapeutic Activity: 8-22 mins                    G Codes:       [name]  PTA [facility]  Acute  Rehab Pager      [phone]

## 2017-07-23 NOTE — Care Management Note (Signed)
Case Management Note  Patient Details  Name: CARLETON VANVALKENBURGH MRN: [id number] Date of Birth: 02-08-1961  Subjective/Objective:                  Awaiting surg on [id number]/iv lfdd-tpa/drains and wound vac  Action/Plan: Date:  July 23, 2017 Chart reviewed for concurrent status and case management needs.  Will continue to follow patient progress.  Discharge Planning: following for needs  Expected discharge date: [id number]  [name], BSN, [place], [place]   Expected Discharge Date:                  Expected Discharge Plan:  Home/Self Care  In-House Referral:     Discharge planning Services  CM Consult  Post Acute Care Choice:    Choice offered to:     DME Arranged:    DME Agency:     HH Arranged:    HH Agency:     Status of Service:  In process, will continue to follow  If discussed at Long Length of Stay Meetings, dates discussed:    Additional Comments:  Leeroy Cha, RN 07/23/2017, 1:01 PM

## 2017-07-23 NOTE — Progress Notes (Signed)
[place] NOTE   Pharmacy Consult for TPN Indication: prolonged ileus, small bowel leak, multiple bowel surgeries  Patient Measurements: Body mass index is 25.88 kg/m. Filed Weights   07/21/17 0500 07/22/17 0548 07/23/17 0500  Weight: 177 lb 4 oz (80.4 kg) 179 lb 14.3 oz (81.6 kg) 180 lb 5.4 oz (81.8 kg)   HPI: 83 yoM admitted on 7/11 for enlarging adrenal mass and planned adrenalectomy.  Pharmacy consulted to dose TPN.  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   7/22 OR:  wash out, tube ileostomy, left with open abdomen & wound vac in place.  Plan for OR on 7/25 for closure 7/25 OR: mesh placed for abdominal closure, wound vac 8/2 extubated, propofol drip off 8/4 TPN off for ~ 5 hours due to line detached from filter 8/10 MD request decrease rate x 1 week 2nd low sodium 8/12 Additional sodium added to TPN due to persistent hyponatremia 8/16 Increasing rate back to goal since sodium has improved with above intervention.   8/20 Prealbumin decreased.  New apparent enteric leak per surgery notes. 8/21 CaPhos product elevated, remove electrolytes from TPN 8/25 PICC did not have blood return in 2 of the 3 ports.  Alteplase intra-catheter and function returned.  TPN infusing correctly on 8/26 per RN. 8/28: d/c SSI 8/30: added electrolytes to TPN 8/31: TPN IV line came out around ~0340 and current bag was stopped at this time.  Restart new clinimix 5/15 1L bag (no lytes since phos is elevated this morning) until next bag is due at [facility]. Will not add MVI and trace elements to this 1L bag.  Use electrolyte free clinimix with new bag at [facility] 9/1 NGT out 9/3 added electrolytes to TPN 9/4 lytes out of TPN 9/7 cycle over 18 hrs 9/8 add back electrolytes to TPN and cycle over 14 hours 9/9 lytes out of TPN, continue 14 hr cycle, DC SSI/CBGs 9/13 electrolytes added back to TPN 9/14 remove electrolytes from TPN  Plan fistula-repair surgery on 10/5  Insulin requirements past 24 hours: none (no hx DM)  Current Nutrition: NPO x ice chips, popsicles 9/5  IVF: none. Lasix 20 IV every other day  Central access: PICC line ordered 7/19, placed on 7/20 TPN start date: 7/20  ASSESSMENT                                                                                                           Today, 07/23/17  - Glucose (goal <150): at goal -  Electrolytes:  K 4.1 (goal >/= 4 for ileus) Mag 1.8 (goal >/= 2 for ileus), remains low despite daily aggressive replacement.  Na 138 - normalized after increasing sodium in TPN. Phos 4.5 - with  electrolytes removed from TPN, CorrCa 10.14, Ca/phos product <55 (goal < 55) - Renal:  SCr wnl - LFTs: WNL except Alk Phos which is elevated but improving - TGs:   206 (9/3), 227 (9/10), 195 (9/16), 193 (9/24) - Prealbumin:  <5 (7/20), 6.1 (7/24), 10.1 (7/30) 24.8 (8/6), 26.4 (8/13), 15.9 (8/20), 19.7 (8/27) 24.7 (9/3) 23.7 (9/10) 18.5 (9/17), 11.5 (9/24)  NUTRITIONAL GOALS                                                                                             RD recs (9/24):  122-138 gms Protein (1.5 - 1.7 g/kg) 2030 - 2270 Kcal  Recalculation: Clinimix 5/15 over 14h cycle with lipids 3x/week on MWF only would provide: 132g protein and avg 2080 kcal/day   PLAN      This morning: - Mag Sulfate 8g IV in 250 mL D5W over 8 hours (administer 1g per hour to try to improve absorption).   At 1800 today: ? Clinimix 5/15 (electrolyte-free formulation) for total of 2633m/day to cycle over 14hrs ? Add sodium to TPN (add 125 mEq of Na/L-- 70 mEq/L NaCl  and 55 mEq/L Na Acetate) for hyponatremia ? Lipids at 292m/hr x 12 hours 3 times weekly on MWF  MVI/TE in TPN and pepcid 40 mg/day in TPN  TPN lab panels on Mondays & Thursdays.  BMET, Mag & Phos daily ordered.  AmHershal CoriaPharmD, BCPS Pager: [phone] 9880/27/2018 7:48 AM

## 2017-07-23 NOTE — Progress Notes (Signed)
Nutrition Follow-up  DOCUMENTATION CODES:   Not applicable  INTERVENTION:  - Continue cyclic TPN regimen per Pharmacy.  - Will continue to monitor for nutrition-related needs, especially after surgery on 10/5.  NUTRITION DIAGNOSIS:   Inadequate oral intake related to inability to eat as evidenced by NPO status. -ongoing  GOAL:   Patient will meet greater than or equal to 90% of their needs -met with cyclic TPN   MONITOR:   Diet advancement, Weight trends, Labs, Skin, I & O's, Other (Comment) (TPN regimen)  ASSESSMENT:   56 y.o. 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 On 7/18, he developed a leak therefore was taken back to OR for re-do of ex-lap and repair of leak.  Significant events: 7/11 LT:JQZES adrenalectomy with complication of intestinal injury and small bowel resection with anastomosis  7/18 PQ:ZRAQTM of small bowel anastomotic leak, abdominal closure of wound dehisence 7/19 OR: reopening of recent laparotomy, lysis of adhesions, noted ischemic perforation of new loop of intestine, intact repair of previous anastomotic leak repair, intact previous anastomosis.  7/22 OR: wash out, tube ileostomy, left with open abdomen &wound vac in place. Plan for OR on 7/25 for closure 7/25 OR: mesh placed for abdominal closure, wound vac 8/2extubated, propofol drip off 8/4TPN off for ~ 5 hours due to line detached from filter. Ultrasound-guided aspiration By interventional radiology of small perihepatic fluid collection yielded only 1 mL of the fluid, sent for culture. NPO - mild coffee ground on NG but no active bleed 8/6patient improving. Off Precedex drip since 8/4. Dilaudid Drip being changed over to PCA. Still with some coffee ground-looking material in NG tube.  8/10 hyponatremia and  plan to decrease TPN from 3L/day to 2L/day x1 week 8/34fund to have fecal matter in wound vac (EC fistula) 8/14increase in fecal matter presence in wound vac with no plan for surgery at this time 8/16TPN advanced to goal rate of 115 ml/hr with 20% ILE @ 20 mL/hr x12 hours on MWF 8/22: NGT clamped. RD to order Boost Breeze for patient to sip on; Mycostatin started for oral thrush. 9/1:pulled NGT and no plans for replacement. 9/1:developed N/V and only permitted sips of clears, if tolerated. 9/4:NGT re-insertion attempted several times without success 9/7:begin cycling TPN 9/23: allowed to leave the floor to go outside   9/27 Pt remains NPO with cyclic TPN as outlined below (on 9/24). Pt with some abdominal pain on Tuesday which was improved with wound vac change yesterday. Weight trending up since 9/24 and is currently +6 lbs/2.6 kg since that date. Plan remains for OR on 10/5 with Dr. MHassell Done  Medications reviewed; 20 mg IV Lasix every 48 hours, 8 g IV Mg sulfate x1 run today, 4 g IV Mg sulfate x3 runs yesterday, 10 mg IV Reglan TID, 40 mg IV Protonix BID, 10 mEq IV KCl x6 runs yesterday.  Labs reviewed; BUN: 24 mg/dL, Ca: 8.7 mg/dL.   9/24 - Pt remains NPO.  - He has a triple lumen PICC which was placed on 05/15/17.  - He is receiving cyclic TPN: Clinimix 52/26x14 hours (2644 mL/day) with 20% ILE @ 20 mL/hr x12 hours on MWF.  - This regimen is providing a daily average of 132 grams of protein and 2083 kcal.  - Weight has been stable since 9/5 (throughtout the time of receiving cyclic  TPN).    9/18 - Pt remains NPO and continues cyclic TPN regimen. - TPN run over 14 hours/day. ILE on MWF.  - Per notes and per rounds this AM, plan for fistula-repair surgery on 10/5.  - Weight has been mainly stable for the past 2 weeks.    9/13 - Palliative Care following and last saw pt yesterday; note states "high probability he had a component of esophageal thrush- his oral thrush  has responded to fluconazole so given his complicated issues with PO intake and nausea should consider 14-21 day course for complete treatment."  - WOC saw pt yesterday and note states wound with 20% brown fecal matter.  - Dr. Earlie Server note states ongoing plan for surgery later this month. - Pt with triple lumen PICC and continued cyclic TPN regimen. -Weight -2 lbs/0.9 kg from 9/8 through this AM.  Mg: 1.6 mg/dL   Diet Order:  Diet NPO time specified Except for: Ice Chips, Other (See Comments) TPN (CLINIMIX) Adult without lytes  Skin:  Wound (see comment) (Incisions to R flank and abdomen from 7/11 and 7/18)  Last BM:  9/17  Height:   Ht Readings from Last 1 Encounters:  05/26/17 _0  (1.778 m)    Weight:   Wt Readings from Last 1 Encounters:  07/23/17 180 lb 5.4 oz (81.8 kg)    Ideal Body Weight:  75.45 kg  BMI:  Body mass index is 25.88 kg/m.  Estimated Nutritional Needs:   Kcal:  2030-2270 (25-28 kcal/kg)  Protein:  122-138 grams (1.5-1.7 grams/kg)  Fluid:  2 L/day  EDUCATION NEEDS:   No education needs identified at this time    Jarome Matin, MS, RD, LDN, CNSC Inpatient Clinical Dietitian Pager # [phone] After hours/weekend pager # [phone]

## 2017-07-24 LAB — BASIC METABOLIC PANEL
Anion gap: 8 (ref 5–15)
BUN: 25 mg/dL — ABNORMAL HIGH (ref 6–20)
CALCIUM: 9 mg/dL (ref 8.9–10.3)
CO2: 28 mmol/L (ref 22–32)
CREATININE: 0.8 mg/dL (ref 0.61–1.24)
Chloride: 104 mmol/L (ref 101–111)
GFR calc Af Amer: >60 mL/min (ref >60)
GFR calc non Af Amer: >60 mL/min (ref >60)
GLUCOSE: 111 mg/dL — AB (ref 65–99)
Potassium: 4 mmol/L (ref 3.5–5.1)
Sodium: 140 mmol/L (ref 135–145)

## 2017-07-24 LAB — PHOSPHORUS: Phosphorus: 5.9 mg/dL — ABNORMAL HIGH (ref 2.5–4.6)

## 2017-07-24 LAB — MAGNESIUM: MAGNESIUM: 1.6 mg/dL — AB (ref 1.7–2.4)

## 2017-07-24 MED ORDER — TRACE MINERALS CR-CU-MN-SE-ZN 10-1000-500-60 MCG/ML IV SOLN
INTRAVENOUS | Status: AC
  Administered 2017-07-24: 18:00:00 via INTRAVENOUS
  Filled 2017-07-24: qty 2000
  Filled 2017-07-24: qty 2644

## 2017-07-24 MED ORDER — FAT EMULSION 20 % IV EMUL
240.0000 mL | INTRAVENOUS | Status: AC
  Administered 2017-07-24: 240 mL via INTRAVENOUS
  Filled 2017-07-24: qty 250

## 2017-07-24 MED ORDER — MAGNESIUM SULFATE 50 % IJ SOLN
10.0000 g | Freq: Once | INTRAVENOUS | Status: AC
  Administered 2017-07-24: 10 g via INTRAVENOUS
  Filled 2017-07-24: qty 20

## 2017-07-24 NOTE — Progress Notes (Signed)
[place] NOTE   Pharmacy Consult for TPN Indication: prolonged ileus, small bowel leak, multiple bowel surgeries  Patient Measurements: Body mass index is 25.05 kg/m. Filed Weights   07/22/17 0548 07/23/17 0500 07/24/17 0500  Weight: 179 lb 14.3 oz (81.6 kg) 180 lb 5.4 oz (81.8 kg) 174 lb 9.7 oz (79.2 kg)   HPI: 75 yoM admitted on 7/11 for enlarging adrenal mass and planned adrenalectomy.  Pharmacy consulted to dose TPN.  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   7/22 OR:  wash out, tube ileostomy, left with open abdomen & wound vac in place.  Plan for OR on 7/25 for closure 7/25 OR: mesh placed for abdominal closure, wound vac 8/2 extubated, propofol drip off 8/4 TPN off for ~ 5 hours due to line detached from filter 8/10 MD request decrease rate x 1 week 2nd low sodium 8/12 Additional sodium added to TPN due to persistent hyponatremia 8/16 Increasing rate back to goal since sodium has improved with above intervention.   8/20 Prealbumin decreased.  New apparent enteric leak per surgery notes. 8/21 CaPhos product elevated, remove electrolytes from TPN 8/25 PICC did not have blood return in 2 of the 3 ports.  Alteplase intra-catheter and function returned.  TPN infusing correctly on 8/26 per RN. 8/28: d/c SSI 8/30: added electrolytes to TPN 8/31: TPN IV line came out around ~0340 and current bag was stopped at this time.  Restart new clinimix 5/15 1L bag (no lytes since phos is elevated this morning) until next bag is due at [facility]. Will not add MVI and trace elements to this 1L  bag. Use electrolyte free clinimix with new bag at [facility] 9/1 NGT out 9/3 added electrolytes to TPN 9/4 lytes out of TPN 9/7 cycle over 18 hrs 9/8 add back electrolytes to TPN and cycle over 14 hours 9/9 lytes out of TPN, continue 14 hr cycle, DC SSI/CBGs 9/13 electrolytes added back to TPN 9/14 remove electrolytes from TPN  Plan fistula-repair surgery on 10/5  Insulin requirements past 24 hours: none (no hx DM)  Current Nutrition: NPO  IVF: none. Lasix 20 IV every other day  Central access: PICC line ordered 7/19, placed on 7/20 TPN start date: 7/20  ASSESSMENT                                                                                                           Today, 07/24/17  - Glucose (goal <150): at goal -  Electrolytes:  K 4 (goal > 4 for ileus) Mag down 1.6 (goal > 2 for ileus), remains low despite daily aggressive replacement.  Na 140 - normalized after increasing sodium in TPN. Phos up 5.9 - with electrolytes removed from  TPN, CorrCa 10.44, Ca/phos product 61 (goal < 55) - Renal:  SCr wnl - LFTs: WNL except Alk Phos which is elevated but improving - TGs:   206 (9/3), 227 (9/10), 195 (9/16), 193 (9/24) - Prealbumin:  <5 (7/20), 6.1 (7/24), 10.1 (7/30) 24.8 (8/6), 26.4 (8/13), 15.9 (8/20), 19.7 (8/27) 24.7 (9/3) 23.7 (9/10) 18.5 (9/17), 11.5 (9/24)  NUTRITIONAL GOALS                                                                                             RD recs (9/27):  122-138 gms Protein (1.5 - 1.7 g/kg) 2030 - 2270 Kcal  Recalculation: Clinimix 5/15 over 14h cycle with lipids 3x/week on MWF only would provide: 132g protein and avg 2080 kcal/day   PLAN      This morning: - Mag Sulfate 10g IV in 500 mL D5W over 16 hours (administer as extended unfusion to try to improve absorption).   At 1800 today: ? Clinimix 5/15 (electrolyte-free formulation) for total of 2617m/day to cycle over 14hrs ? Add sodium to TPN (add 125 mEq of Na/L-- 70 mEq/L NaCl and 55 mEq/L  Na Acetate) for hyponatremia ? Lipids at 231m/hr x 12 hours 3 times weekly on MWF  MVI/TE in TPN and pepcid 40 mg/day in TPN  TPN lab panels on Mondays & Thursdays.  BMET, Mag & Phos daily ordered.  AnDia SitterPharmD, BCPS 07/24/2017 7:29 AM

## 2017-07-24 NOTE — Progress Notes (Addendum)
Physical Therapy Treatment Patient Details Name: [name] [name] MRN: [id number] DOB: 19-Nov-1960 Today's Date: 07/24/2017    History of Present Illness  56 y.o. 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 On 7/18, he developed a leak therefore was taken back to OR for re-do of ex-lap and repair of leak on 05/20/17.  He returned to the ICU on the vent . Extubated on 05/28/17.     PT Comments    Assisted OOB to amb 3 laps around unit while monitoring vitals and pain level.    Follow Up Recommendations  SNF;Supervision/Assistance - 24 hour     Equipment Recommendations       Recommendations for Other Services       Precautions / Restrictions Precautions Precautions: Fall Precaution Comments: multiple lines in abdomen, VAC, 2 ABD drains , and JP drain Restrictions Weight Bearing Restrictions: No    Mobility  Bed Mobility Overal bed mobility: Needs Assistance Bed Mobility: Supine to Sit     Supine to sit: Supervision;Min guard        Transfers Overall transfer level: Needs assistance Equipment used: Rolling walker (2 wheeled) Transfers: Sit to/from Stand Sit to Stand: Supervision;Min guard         General transfer comment: watched lines, cues for UE placement esp for stand to sit to reach back prior to sit  Ambulation/Gait Ambulation/Gait assistance: Supervision;Min guard Ambulation Distance (Feet): 1550 Feet Assistive device: Rolling walker (2 wheeled) Gait Pattern/deviations: Step-through pattern;Decreased step length - right;Decreased step length - left;Shuffle;Trunk flexed;Narrow base of support Gait velocity: WFL   General Gait Details: 3 lap around unit while monitoring vitals   during walk the wound VAC started beeping then shut off/notified RN   Stairs             Wheelchair Mobility    Modified Rankin (Stroke Patients Only)       Balance                                            Cognition Arousal/Alertness: Awake/alert Behavior During Therapy: WFL for tasks assessed/performed Overall Cognitive Status: Within Functional Limits for tasks assessed                                 General Comments: pleasant/cooperative/motivated      Exercises      General Comments        Pertinent Vitals/Pain Pain Assessment: Faces Faces Pain Scale: Hurts a little bit Pain Location: ABD VAC location Pain Descriptors / Indicators: Grimacing Pain Intervention(s): Monitored during session;Repositioned    Home Living                      Prior Function            PT Goals (current goals can now be found in the care plan section) Progress towards PT goals: Progressing toward goals    Frequency    Min 3X/week      PT Plan Current plan remains appropriate    Co-evaluation              AM-PAC PT "6 Clicks" Daily Activity  Outcome  Measure  Difficulty turning over in bed (including adjusting bedclothes, sheets and blankets)?: A Little Difficulty moving from lying on back to sitting on the side of the bed? : A Little Difficulty sitting down on and standing up from a chair with arms (e.g., wheelchair, bedside commode, etc,.)?: A Little Help needed moving to and from a bed to chair (including a wheelchair)?: A Little Help needed walking in hospital room?: A Little Help needed climbing 3-5 steps with a railing? : A Little 6 Click Score: 18    End of Session Equipment Utilized During Treatment: Gait belt Activity Tolerance: Patient tolerated treatment well Patient left: in chair;with call bell/phone within reach Nurse Communication: Mobility status (wound VAC shut off) PT Visit Diagnosis: Difficulty in walking, not elsewhere classified (R26.2);Muscle weakness (generalized) (M62.81)      Time: 6122-4497 PT Time Calculation (min) (ACUTE ONLY): 27 min  Charges:  $Gait Training: 8-22 mins $Therapeutic Activity: 8-22 mins                    G Codes:       [name]  PTA [facility]  Acute  Rehab Pager      [phone]

## 2017-07-24 NOTE — Progress Notes (Signed)
   07/24/17 1400  Clinical Encounter Type  Visited With Patient and family together  Visit Type Follow-up  Spiritual Encounters  Spiritual Needs Emotional;Prayer   Checking on the patient.  He continues to want to get stronger as he prepares for surgery.  Wife continues to be a great support and he said today was a pretty good day.  Prayed together.  Will continue to follow. Chaplain Katherene Ponto

## 2017-07-24 NOTE — Progress Notes (Signed)
Patient ID: [name] [name], male   DOB: Oct 29, 1960, 56 y.o.   MRN: [id number]  [facility] Progress Note  65 Days Post-Op  Subjective: CC- abdominal pain Patient states that he has been having a pinching/pulling pain below wound vac again. Required 11 doses fentanyl yesterday. Anxious about vac change. Denies any n/v.   Objective: Vital signs in last 24 hours: Temp:  [97.4 F (36.3 C)-98.5 F (36.9 C)] 97.8 F (36.6 C) (09/28 0800) Pulse Rate:  [86-102] 86 (09/27 2000) Resp:  [12-23] 22 (09/28 0600) BP: (97-134)/(62-82) 126/72 (09/28 0600) SpO2:  [94 %-100 %] 100 % (09/28 0400) Weight:  [174 lb 9.7 oz (79.2 kg)] 174 lb 9.7 oz (79.2 kg) (09/28 0500) Last BM Date: 07/15/17  Intake/Output from previous day: 09/27 0701 - 09/28 0700 In: 2392.8 [I.V.:2377.8] Out: 2280 [Urine:1125; Drains:1155] Intake/Output this shift: Total I/O In: -  Out: 800 [Urine:800]  PE: Gen: Alert, NAD HEENT: EOMs intact, pupils equal and round Pulm: Normal effort, CTAB Cardio: RRR Abd: soft, ND, +BS, wound vac in place RUQ, two foleys in place and draining  Lab Results:  No results for input(s): WBC, HGB, HCT, PLT in the last 72 hours. BMET  Recent Labs  07/23/17 0500 07/24/17 0332  NA 138 140  K 4.1 4.0  CL 105 104  CO2 26 28  GLUCOSE 109* 111*  BUN 24* 25*  CREATININE 0.74 0.80  CALCIUM 8.7* 9.0   PT/INR No results for input(s): LABPROT, INR in the last 72 hours. CMP     Component Value Date/Time   NA 140 07/24/2017 0332   K 4.0 07/24/2017 0332   CL 104 07/24/2017 0332   CO2 28 07/24/2017 0332   GLUCOSE 111 (H) 07/24/2017 0332   BUN 25 (H) 07/24/2017 0332   CREATININE 0.80 07/24/2017 0332   CREATININE 0.85 07/22/2013 1021   CALCIUM 9.0 07/24/2017 0332   PROT 6.0 (L) 07/23/2017 0500   ALBUMIN 2.2 (L) 07/23/2017 0500   AST 21 07/23/2017 0500   ALT 18 07/23/2017 0500   ALKPHOS 141 (H) 07/23/2017 0500   BILITOT 0.2 (L) 07/23/2017 0500   GFRNONAA >60 07/24/2017  0332   GFRAA >60 07/24/2017 0332   Lipase     Component Value Date/Time   LIPASE 27 09/15/2015 0934       Studies/Results: No results found.  Anti-infectives: Anti-infectives    Start     Dose/Rate Route Frequency Ordered Stop   07/08/17 1600  fluconazole (DIFLUCAN) IVPB 100 mg     100 mg 50 mL/hr over 60 Minutes Intravenous Every 24 hours 07/08/17 0828 07/14/17 1710   07/01/17 1500  fluconazole (DIFLUCAN) IVPB 100 mg     100 mg 50 mL/hr over 60 Minutes Intravenous Every 24 hours 07/01/17 1401 07/07/17 1715   06/16/17 1200  piperacillin-tazobactam (ZOSYN) IVPB 3.375 g     3.375 g 12.5 mL/hr over 240 Minutes Intravenous Every 8 hours 06/16/17 1026 06/17/17 0004   06/08/17 1200  piperacillin-tazobactam (ZOSYN) IVPB 3.375 g  Status:  Discontinued     3.375 g 12.5 mL/hr over 240 Minutes Intravenous Every 8 hours 06/08/17 1056 06/16/17 1026   05/29/17 2200  ceFEPIme (MAXIPIME) 2 g in dextrose 5 % 50 mL IVPB  Status:  Discontinued     2 g 100 mL/hr over 30 Minutes Intravenous Every 8 hours 05/29/17 2031 06/03/17 0832   05/29/17 2100  metroNIDAZOLE (FLAGYL) IVPB 500 mg  Status:  Discontinued     500 mg 100  mL/hr over 60 Minutes Intravenous Every 8 hours 05/29/17 2029 06/03/17 0832   05/26/17 2200  ceFAZolin (ANCEF) IVPB 1 g/50 mL premix  Status:  Discontinued     1 g 100 mL/hr over 30 Minutes Intravenous Every 8 hours 05/26/17 1008 05/29/17 2023   05/25/17 1100  vancomycin (VANCOCIN) 1,250 mg in sodium chloride 0.9 % 250 mL IVPB  Status:  Discontinued     1,250 mg 166.7 mL/hr over 90 Minutes Intravenous Every 12 hours 05/25/17 0955 05/26/17 0946   05/22/17 1400  anidulafungin (ERAXIS) 100 mg in sodium chloride 0.9 % 100 mL IVPB     100 mg 78 mL/hr over 100 Minutes Intravenous Every 24 hours 05/21/17 1330 05/27/17 1740   05/21/17 1400  anidulafungin (ERAXIS) 200 mg in sodium chloride 0.9 % 200 mL IVPB     200 mg 78 mL/hr over 200 Minutes Intravenous  Once 05/21/17 1330 05/21/17  1940   05/15/17 1000  anidulafungin (ERAXIS) 100 mg in sodium chloride 0.9 % 100 mL IVPB  Status:  Discontinued     100 mg 78 mL/hr over 100 Minutes Intravenous Every 24 hours 05/14/17 0829 05/16/17 0901   05/15/17 1000  metroNIDAZOLE (FLAGYL) IVPB 500 mg  Status:  Discontinued     500 mg 100 mL/hr over 60 Minutes Intravenous Every 8 hours 05/15/17 0903 05/25/17 0938   05/14/17 0900  anidulafungin (ERAXIS) 200 mg in sodium chloride 0.9 % 200 mL IVPB     200 mg 78 mL/hr over 200 Minutes Intravenous  Once 05/14/17 0829 05/14/17 1325   05/13/17 1927  vancomycin (VANCOCIN) 1-5 GM/200ML-% IVPB    Comments:  Ward, Christa   : cabinet override      05/13/17 1927 05/14/17 0744   05/12/17 1400  ceFEPIme (MAXIPIME) 1 g in dextrose 5 % 50 mL IVPB     1 g 100 mL/hr over 30 Minutes Intravenous Every 8 hours 05/12/17 0939 05/26/17 2359   05/12/17 0900  vancomycin (VANCOCIN) IVPB 1000 mg/200 mL premix  Status:  Discontinued     1,000 mg 200 mL/hr over 60 Minutes Intravenous Every 12 hours 05/12/17 0750 05/16/17 0852   05/12/17 0830  aztreonam (AZACTAM) 2 GM IVPB     2 g 100 mL/hr over 30 Minutes Intravenous  Once 05/12/17 0800 05/12/17 0957   05/06/17 0916  vancomycin (VANCOCIN) IVPB 1000 mg/200 mL premix     1,000 mg 200 mL/hr over 60 Minutes Intravenous On call to O.R. 05/06/17 0916 05/06/17 1229       Assessment/Plan Adrenal mass/hx of prior hemicolectomy/ileocolic anastomosis 7124 1. S/p laparoscopic converted to open right adrenalectomy, small bowel resection with anastomosis, placement of 20cm^2 wound vac, 05/06/17, Dr. Lurena Joiner [name] 2. Post op wound dehiscence - primary repair of small bowel anastomotic leak, abdominal closure of wound dehisence, 05/13/17, Luke [name] 3. S/p reopening of recent laparotomy, lysis of adhesions, small bowel resection, placement 200cm^2 negative pressure dressing, 05/14/17, Luke [name] 4. S/p reopening of recent laparotomy, creation of tube ileostomy,  placement of 100cm2 negative pressure dressing, 05/17/17, Luke [name] 5. S/p abdominal wash out, placement of mesh for abdominal closure, placement of wound vac of 100cm^2, 05/20/17, Luke [name] 6. S/p Bronchoscopy 05/22/17 Dr. Simonne Maffucci EC fistula - noted on exam 06/13/17 Acute respiratory failure/ post op HCAP pneumonia/post op pleural effusion - CCM/extubated 05/28/17 COPD/Tobacco use  Malnutrition - PICC/TNA 05/14/17. Prealbumin 11.5 (9/24) HTN Anemia Anxiety Hypothyroidism Chronic pain medicine use: Prior to admission he took about 40 mg oxycodone per  day - goes to pain clinic - sees Dr. [name] for neck  FEN: NPO/ice chips/TNA ID: none currently none DVT: Lovenox  Plan: Plan to return later today with WOC during vac change. Continue bowel rest and TNA. Labs in AM. OR scheduled for 10/5 with Dr. Hassell Done   LOS: 30 days    Wellington Hampshire , [facility] Surgery 07/24/2017, 12:20 PM Pager: [phone] Consults: [phone] Mon-Fri 7:00 am-4:30 pm Sat-Sun 7:00 am-11:30 am   Addendum:

## 2017-07-25 LAB — CBC
HCT: 26.3 % — ABNORMAL LOW (ref 39.0–52.0)
HEMOGLOBIN: 8.4 g/dL — AB (ref 13.0–17.0)
MCH: 24.2 pg — AB (ref 26.0–34.0)
MCHC: 31.9 g/dL (ref 30.0–36.0)
MCV: 75.8 fL — ABNORMAL LOW (ref 78.0–100.0)
PLATELETS: 578 10*3/uL — AB (ref 150–400)
RBC: 3.47 MIL/uL — AB (ref 4.22–5.81)
RDW: 17 % — ABNORMAL HIGH (ref 11.5–15.5)
WBC: 8.1 10*3/uL (ref 4.0–10.5)

## 2017-07-25 LAB — BASIC METABOLIC PANEL
ANION GAP: 7 (ref 5–15)
BUN: 25 mg/dL — ABNORMAL HIGH (ref 6–20)
CALCIUM: 6.2 mg/dL — AB (ref 8.9–10.3)
CO2: 29 mmol/L (ref 22–32)
CREATININE: 0.77 mg/dL (ref 0.61–1.24)
Chloride: 102 mmol/L (ref 101–111)
GFR calc non Af Amer: >60 mL/min (ref >60)
Glucose, Bld: 157 mg/dL — ABNORMAL HIGH (ref 65–99)
Potassium: 4.9 mmol/L (ref 3.5–5.1)
SODIUM: 138 mmol/L (ref 135–145)

## 2017-07-25 LAB — CALCIUM: CALCIUM: 8.7 mg/dL — AB (ref 8.9–10.3)

## 2017-07-25 LAB — MAGNESIUM: MAGNESIUM: 1.5 mg/dL — AB (ref 1.7–2.4)

## 2017-07-25 LAB — PHOSPHORUS: Phosphorus: 5.4 mg/dL — ABNORMAL HIGH (ref 2.5–4.6)

## 2017-07-25 MED ORDER — DEXTROSE 5 % IV SOLN: 3.0000 g | Freq: Once | INTRAVENOUS | Status: DC

## 2017-07-25 MED ORDER — TRACE MINERALS CR-CU-MN-SE-ZN 10-1000-500-60 MCG/ML IV SOLN
INTRAVENOUS | Status: AC
  Administered 2017-07-25: 18:00:00 via INTRAVENOUS
  Filled 2017-07-25: qty 2000
  Filled 2017-07-25: qty 2644

## 2017-07-25 MED ORDER — SODIUM CHLORIDE 0.9 % IV SOLN: 2.0000 g | Freq: Once | INTRAVENOUS | Status: DC

## 2017-07-25 MED ORDER — MAGNESIUM SULFATE 4 GM/100ML IV SOLN
4.0000 g | INTRAVENOUS | Status: AC
  Administered 2017-07-25 (×3): 4 g via INTRAVENOUS
  Filled 2017-07-25 (×3): qty 100

## 2017-07-25 NOTE — Progress Notes (Signed)
Patient ID: [name] [name], male   DOB: 1961/10/08, 56 y.o.   MRN: [id number]   [facility] Progress Note:   66 Days Post-Op  Subjective: Mental status is alert Objective: Vital signs in last 24 hours: Temp:  [97.4 F (36.3 C)-98.6 F (37 C)] 98.6 F (37 C) (09/29 0842) Pulse Rate:  [88-98] 95 (09/29 0800) Resp:  [13-18] 16 (09/29 0800) BP: (83-117)/(56-83) 106/70 (09/29 0800) SpO2:  [97 %-99 %] 99 % (09/29 0800) Weight:  [80.9 kg (178 lb 5.6 oz)] 80.9 kg (178 lb 5.6 oz) (09/29 0422)  Intake/Output from previous day: 09/28 0701 - 09/29 0700 In: 3276.7 [I.V.:3222.7; IV Piggyback:54] Out: 1740 [Urine:1450; Drains:290] Intake/Output this shift: Total I/O In: 778.4 [I.V.:678.4; IV Piggyback:100] Out: 660 [Urine:250; Drains:410]  Physical Exam: Work of breathing is normal;  No pain in incision  Lab Results:  Results for orders placed or performed during the hospital encounter of 05/06/17 (from the past 48 hour(s))  Magnesium     Status: Abnormal   Collection Time: 07/24/17  3:32 AM  Result Value Ref Range   Magnesium 1.6 (L) 1.7 - 2.4 mg/dL  Phosphorus     Status: Abnormal   Collection Time: 07/24/17  3:32 AM  Result Value Ref Range   Phosphorus 5.9 (H) 2.5 - 4.6 mg/dL  Basic metabolic panel     Status: Abnormal   Collection Time: 07/24/17  3:32 AM  Result Value Ref Range   Sodium 140 135 - 145 mmol/L   Potassium 4.0 3.5 - 5.1 mmol/L   Chloride 104 101 - 111 mmol/L   CO2 28 22 - 32 mmol/L   Glucose, Bld 111 (H) 65 - 99 mg/dL   BUN 25 (H) 6 - 20 mg/dL   Creatinine, Ser 9.42 0.61 - 1.24 mg/dL   Calcium 9.0 8.9 - 05.8 mg/dL   GFR calc non Af Amer >60 >60 mL/min   GFR calc Af Amer >60 >60 mL/min    Comment: (NOTE) The eGFR has been calculated using the CKD EPI equation. This calculation has not been validated in all clinical situations. eGFR's persistently <60 mL/min signify possible Chronic Kidney Disease.    Anion gap 8 5 - 15  Magnesium     Status:  Abnormal   Collection Time: 07/25/17  4:02 AM  Result Value Ref Range   Magnesium 1.5 (L) 1.7 - 2.4 mg/dL  Phosphorus     Status: Abnormal   Collection Time: 07/25/17  4:02 AM  Result Value Ref Range   Phosphorus 5.4 (H) 2.5 - 4.6 mg/dL  Basic metabolic panel     Status: Abnormal   Collection Time: 07/25/17  4:02 AM  Result Value Ref Range   Sodium 138 135 - 145 mmol/L   Potassium 4.9 3.5 - 5.1 mmol/L    Comment: DELTA CHECK NOTED   Chloride 102 101 - 111 mmol/L   CO2 29 22 - 32 mmol/L   Glucose, Bld 157 (H) 65 - 99 mg/dL   BUN 25 (H) 6 - 20 mg/dL   Creatinine, Ser 4.38 0.61 - 1.24 mg/dL   Calcium 6.2 (LL) 8.9 - 10.3 mg/dL    Comment: DELTA CHECK NOTED CRITICAL RESULT CALLED TO, READ BACK BY AND VERIFIED WITH: A SAWYERS AT 0507 ON 09.29.2018 BY NBROOKS    GFR calc non Af Amer >60 >60 mL/min   GFR calc Af Amer >60 >60 mL/min    Comment: (NOTE) The eGFR has been calculated using the CKD EPI equation. This  calculation has not been validated in all clinical situations. eGFR's persistently <60 mL/min signify possible Chronic Kidney Disease.    Anion gap 7 5 - 15  CBC     Status: Abnormal   Collection Time: 07/25/17  4:02 AM  Result Value Ref Range   WBC 8.1 4.0 - 10.5 K/uL   RBC 3.47 (L) 4.22 - 5.81 MIL/uL   Hemoglobin 8.4 (L) 13.0 - 17.0 g/dL   HCT 26.3 (L) 39.0 - 52.0 %   MCV 75.8 (L) 78.0 - 100.0 fL   MCH 24.2 (L) 26.0 - 34.0 pg   MCHC 31.9 30.0 - 36.0 g/dL   RDW 17.0 (H) 11.5 - 15.5 %   Platelets 578 (H) 150 - 400 K/uL  Calcium     Status: Abnormal   Collection Time: 07/25/17  7:35 AM  Result Value Ref Range   Calcium 8.7 (L) 8.9 - 10.3 mg/dL    Comment: DELTA CHECK NOTED    Radiology/Results: No results found.  Anti-infectives: Anti-infectives    Start     Dose/Rate Route Frequency Ordered Stop   07/08/17 1600  fluconazole (DIFLUCAN) IVPB 100 mg     100 mg 50 mL/hr over 60 Minutes Intravenous Every 24 hours 07/08/17 0828 07/14/17 1710   07/01/17 1500   fluconazole (DIFLUCAN) IVPB 100 mg     100 mg 50 mL/hr over 60 Minutes Intravenous Every 24 hours 07/01/17 1401 07/07/17 1715   06/16/17 1200  piperacillin-tazobactam (ZOSYN) IVPB 3.375 g     3.375 g 12.5 mL/hr over 240 Minutes Intravenous Every 8 hours 06/16/17 1026 06/17/17 0004   06/08/17 1200  piperacillin-tazobactam (ZOSYN) IVPB 3.375 g  Status:  Discontinued     3.375 g 12.5 mL/hr over 240 Minutes Intravenous Every 8 hours 06/08/17 1056 06/16/17 1026   05/29/17 2200  ceFEPIme (MAXIPIME) 2 g in dextrose 5 % 50 mL IVPB  Status:  Discontinued     2 g 100 mL/hr over 30 Minutes Intravenous Every 8 hours 05/29/17 2031 06/03/17 0832   05/29/17 2100  metroNIDAZOLE (FLAGYL) IVPB 500 mg  Status:  Discontinued     500 mg 100 mL/hr over 60 Minutes Intravenous Every 8 hours 05/29/17 2029 06/03/17 0832   05/26/17 2200  ceFAZolin (ANCEF) IVPB 1 g/50 mL premix  Status:  Discontinued     1 g 100 mL/hr over 30 Minutes Intravenous Every 8 hours 05/26/17 1008 05/29/17 2023   05/25/17 1100  vancomycin (VANCOCIN) 1,250 mg in sodium chloride 0.9 % 250 mL IVPB  Status:  Discontinued     1,250 mg 166.7 mL/hr over 90 Minutes Intravenous Every 12 hours 05/25/17 0955 05/26/17 0946   05/22/17 1400  anidulafungin (ERAXIS) 100 mg in sodium chloride 0.9 % 100 mL IVPB     100 mg 78 mL/hr over 100 Minutes Intravenous Every 24 hours 05/21/17 1330 05/27/17 1740   05/21/17 1400  anidulafungin (ERAXIS) 200 mg in sodium chloride 0.9 % 200 mL IVPB     200 mg 78 mL/hr over 200 Minutes Intravenous  Once 05/21/17 1330 05/21/17 1940   05/15/17 1000  anidulafungin (ERAXIS) 100 mg in sodium chloride 0.9 % 100 mL IVPB  Status:  Discontinued     100 mg 78 mL/hr over 100 Minutes Intravenous Every 24 hours 05/14/17 0829 05/16/17 0901   05/15/17 1000  metroNIDAZOLE (FLAGYL) IVPB 500 mg  Status:  Discontinued     500 mg 100 mL/hr over 60 Minutes Intravenous Every 8 hours 05/15/17 0903 05/25/17 9169  05/14/17 0900  anidulafungin  (ERAXIS) 200 mg in sodium chloride 0.9 % 200 mL IVPB     200 mg 78 mL/hr over 200 Minutes Intravenous  Once 05/14/17 0829 05/14/17 1325   05/13/17 1927  vancomycin (VANCOCIN) 1-5 GM/200ML-% IVPB    Comments:  Ward, Christa   : cabinet override      05/13/17 1927 05/14/17 0744   05/12/17 1400  ceFEPIme (MAXIPIME) 1 g in dextrose 5 % 50 mL IVPB     1 g 100 mL/hr over 30 Minutes Intravenous Every 8 hours 05/12/17 0939 05/26/17 2359   05/12/17 0900  vancomycin (VANCOCIN) IVPB 1000 mg/200 mL premix  Status:  Discontinued     1,000 mg 200 mL/hr over 60 Minutes Intravenous Every 12 hours 05/12/17 0750 05/16/17 0852   05/12/17 0830  aztreonam (AZACTAM) 2 GM IVPB     2 g 100 mL/hr over 30 Minutes Intravenous  Once 05/12/17 0800 05/12/17 0957   05/06/17 0916  vancomycin (VANCOCIN) IVPB 1000 mg/200 mL premix     1,000 mg 200 mL/hr over 60 Minutes Intravenous On call to O.R. 05/06/17 0916 05/06/17 1229      Assessment/Plan: Problem List: Patient Active Problem List   Diagnosis Date Noted  . Hypomagnesemia 06/28/2017  . Hypothyroidism   . 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 Protein-calorie malnutrition, severe ([place]) 06/27/2017  . 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 Pressure injury of skin 05/29/2017  . Ileus ([place])   . Hypokalemia 05/14/2017  . Anastomotic leak of intestine 05/14/2017  . Wound dehiscence, surgical 05/14/2017  . Aspiration pneumonia ([place]) 05/14/2017  . Chronic narcotic use 05/10/2017  . Generalized anxiety disorder 05/10/2017  . Intra-abdominal adhesions s/p SB resection 05/06/2017 05/09/2017  . Throat symptom 03/18/2016  . Multinodular goiter 01/19/2016  . Hyperthyroidism 01/19/2016  . Essential hypertension   . Diarrhea 11/30/2014  . Anemia, iron deficiency 11/01/2014  . Leukocytosis 11/03/2013  . Tobacco abuse 07/26/2013  . COPD (chronic obstructive pulmonary disease) ([place]) 07/26/2013  . Left knee pain 07/26/2013   . Pain in joint, shoulder region 05/03/2013  . GERD 07/24/2008  . 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    Exploration this Friday.   66 Days Post-Op    LOS: 80 days   Matt B. Hassell Done, MD, [facility] [phone] beeper [phone]  07/25/2017 11:28 AM

## 2017-07-25 NOTE — Progress Notes (Addendum)
CRITICAL VALUE ALERT  Critical Value:  Calcium 6.2  Date & Time Notied: 07/25/17 @5 :35am  Provider Notified: Neysa Bonito

## 2017-07-25 NOTE — Progress Notes (Signed)
PHARMACY - ADULT TOTAL PARENTERAL NUTRITION CONSULT NOTE   Pharmacy Consult for TPN Indication: prolonged ileus, small bowel leak, multiple bowel surgeries  Patient Measurements: Body mass index is 25.59 kg/m. Filed Weights   07/23/17 0500 07/24/17 0500 07/25/17 0422  Weight: 180 lb 5.4 oz (81.8 kg) 174 lb 9.7 oz (79.2 kg) 178 lb 5.6 oz (80.9 kg)   HPI: 3 yoM admitted on 7/11 for enlarging adrenal mass and planned adrenalectomy.  Pharmacy consulted to dose TPN.  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   7/22 OR:  wash out, tube ileostomy, left with open abdomen & wound vac in place.  Plan for OR on 7/25 for closure 7/25 OR: mesh placed for abdominal closure, wound vac 8/2 extubated, propofol drip off 8/4 TPN off for ~ 5 hours due to line detached from filter 8/10 MD request decrease rate x 1 week 2nd low sodium 8/12 Additional sodium added to TPN due to persistent hyponatremia 8/16 Increasing rate back to goal since sodium has improved with above intervention.   8/20 Prealbumin decreased.  New apparent enteric leak per surgery notes. 8/21 CaPhos product elevated, remove electrolytes from TPN 8/25 PICC did not have blood return in 2 of the 3 ports.  Alteplase intra-catheter and function returned.  TPN infusing correctly on 8/26 per RN. 8/28: d/c SSI 8/30: added electrolytes to TPN 8/31: TPN IV line came out around ~0340 and current bag was stopped at this time.  Restart new clinimix 5/15 1L bag (no lytes since phos is elevated this morning) until next bag is due at [facility]. Will not add MVI and trace elements to this 1L  bag. Use electrolyte free clinimix with new bag at [facility] 9/1 NGT out 9/3 added electrolytes to TPN 9/4 lytes out of TPN 9/7 cycle over 18 hrs 9/8 add back electrolytes to TPN and cycle over 14 hours 9/9 lytes out of TPN, continue 14 hr cycle, DC SSI/CBGs 9/13 electrolytes added back to TPN 9/14 remove electrolytes from TPN  Plan fistula-repair surgery on 10/5  Insulin requirements past 24 hours: none (no hx DM)  Current Nutrition: NPO  IVF: none. Lasix 20 IV every other day  Central access: PICC line ordered 7/19, placed on 7/20 TPN start date: 7/20  ASSESSMENT                                                                                                           Today, 07/25/17  - Glucose (goal <150): at goal -  Electrolytes:  K increased to 4.9 with last KCL repletion on 9/26  (goal > 4 for ileus) Mag down 1.5 (goal > 2 for ileus), remains low despite daily aggressive replacement.  Na 138 - normalized after increasing sodium in  TPN. Phos elevated at 5.4 - with electrolytes removed from TPN, CorrCa drop sharply with level at 4AM but when rechecked level at 7:30 AM to verify it came back as 10.14 , Ca/phos product 54.7 (goal < 55) - Renal:  SCr wnl - LFTs: WNL except Alk Phos which is elevated but improving - TGs:   206 (9/3), 227 (9/10), 195 (9/16), 193 (9/24) - Prealbumin:  <5 (7/20), 6.1 (7/24), 10.1 (7/30) 24.8 (8/6), 26.4 (8/13), 15.9 (8/20), 19.7 (8/27) 24.7 (9/3) 23.7 (9/10) 18.5 (9/17), 11.5 (9/24)  NUTRITIONAL GOALS                                                                                             RD recs (9/27):  122-138 gms Protein (1.5 - 1.7 g/kg) 2030 - 2270 Kcal  Recalculation: Clinimix 5/15 over 14h cycle with lipids 3x/week on MWF only would provide: 132g protein and avg 2080 kcal/day   PLAN      This morning: - extended infusion of magnesium doesn't appear to be effective since Mag level went down after giving 10 gm of Mag sulfate over 16 hrs  yesterday.  Will give mag sulfate 4gm IV q4h x3 doses   At 1800 today: ? Clinimix 5/15 (electrolyte-free formulation) for total of 2674m/day to cycle over 14hrs ? Add sodium to TPN (add 125 mEq of Na/L-- 70 mEq/L NaCl and 55 mEq/L Na Acetate) for hyponatremia ? Lipids at 250m/hr x 12 hours 3 times weekly on MWF  MVI/TE in TPN and pepcid 40 mg/day in TPN  TPN lab panels on Mondays & Thursdays.  BMET, Mag & Phos daily ordered.  AnDia SitterPharmD, BCPS 07/25/2017 7:09 AM

## 2017-07-26 LAB — BASIC METABOLIC PANEL
ANION GAP: 9 (ref 5–15)
BUN: 26 mg/dL — AB (ref 6–20)
CO2: 27 mmol/L (ref 22–32)
Calcium: 8.9 mg/dL (ref 8.9–10.3)
Chloride: 104 mmol/L (ref 101–111)
Creatinine, Ser: 0.83 mg/dL (ref 0.61–1.24)
GFR calc Af Amer: >60 mL/min (ref >60)
Glucose, Bld: 107 mg/dL — ABNORMAL HIGH (ref 65–99)
POTASSIUM: 3.7 mmol/L (ref 3.5–5.1)
Sodium: 140 mmol/L (ref 135–145)

## 2017-07-26 LAB — SURGICAL PCR SCREEN
MRSA, PCR: NEGATIVE
STAPHYLOCOCCUS AUREUS: POSITIVE — AB

## 2017-07-26 LAB — PHOSPHORUS: Phosphorus: 5 mg/dL — ABNORMAL HIGH (ref 2.5–4.6)

## 2017-07-26 LAB — MAGNESIUM: Magnesium: 1.5 mg/dL — ABNORMAL LOW (ref 1.7–2.4)

## 2017-07-26 MED ORDER — DIPHENHYDRAMINE HCL 50 MG/ML IJ SOLN: 12.5000 mg | Freq: Four times a day (QID) | INTRAMUSCULAR | Status: DC | PRN

## 2017-07-26 MED ORDER — MAGNESIUM SULFATE 4 GM/100ML IV SOLN
4.0000 g | INTRAVENOUS | Status: AC
  Administered 2017-07-26 (×3): 4 g via INTRAVENOUS
  Filled 2017-07-26 (×3): qty 100

## 2017-07-26 MED ORDER — SODIUM CHLORIDE 0.9% FLUSH: 9.0000 mL | INTRAVENOUS | Status: DC | PRN

## 2017-07-26 MED ORDER — ONDANSETRON HCL 4 MG/2ML IJ SOLN: 4.0000 mg | Freq: Four times a day (QID) | INTRAMUSCULAR | Status: DC | PRN

## 2017-07-26 MED ORDER — DIPHENHYDRAMINE HCL 12.5 MG/5ML PO ELIX: 12.5000 mg | ORAL_SOLUTION | Freq: Four times a day (QID) | ORAL | Status: DC | PRN

## 2017-07-26 MED ORDER — POTASSIUM CHLORIDE 10 MEQ/100ML IV SOLN
10.0000 meq | INTRAVENOUS | Status: AC
  Administered 2017-07-26 (×3): 10 meq via INTRAVENOUS
  Filled 2017-07-26: qty 100

## 2017-07-26 MED ORDER — HYDROMORPHONE 1 MG/ML IV SOLN
INTRAVENOUS | Status: DC
  Administered 2017-07-26: 13:00:00 via INTRAVENOUS
  Administered 2017-07-26: 3.4 mg via INTRAVENOUS
  Administered 2017-07-26: 4 mL via INTRAVENOUS
  Administered 2017-07-27: 0.3 mg via INTRAVENOUS
  Administered 2017-07-27: 0.9 mL via INTRAVENOUS
  Administered 2017-07-27: 3.3 mg via INTRAVENOUS
  Administered 2017-07-27: 0.6 mg via INTRAVENOUS
  Administered 2017-07-27: 0.6 mL via INTRAVENOUS
  Administered 2017-07-27: 0.3 mg via INTRAVENOUS
  Administered 2017-07-28: 0.9 mg via INTRAVENOUS
  Administered 2017-07-28: 6.3 mL via INTRAVENOUS
  Administered 2017-07-28: 0.3 mg via INTRAVENOUS
  Administered 2017-07-28: 0.9 mg via INTRAVENOUS
  Administered 2017-07-28: 0.3 mg via INTRAVENOUS
  Administered 2017-07-29 (×2): 0.9 mL via INTRAVENOUS
  Filled 2017-07-26: qty 25

## 2017-07-26 MED ORDER — TRACE MINERALS CR-CU-MN-SE-ZN 10-1000-500-60 MCG/ML IV SOLN
INTRAVENOUS | Status: AC
  Administered 2017-07-26: 18:00:00 via INTRAVENOUS
  Filled 2017-07-26: qty 2644
  Filled 2017-07-26: qty 2000

## 2017-07-26 MED ORDER — NALOXONE HCL 0.4 MG/ML IJ SOLN: 0.4000 mg | INTRAMUSCULAR | Status: DC | PRN

## 2017-07-26 NOTE — Progress Notes (Signed)
Patient ID: [name] [name], male   DOB: January 04, 1961, 56 y.o.   MRN: [id number]  [facility] Surgery Progress Note:   67 Days Post-Op  Subjective: Mental status is clear.  Having pain in incisional VAC and requested to go back on the PCA  Objective: Vital signs in last 24 hours: Temp:  [97.1 F (36.2 C)-98.4 F (36.9 C)] 98.4 F (36.9 C) (09/30 0811) Pulse Rate:  [84-103] 99 (09/30 0800) Resp:  [15-23] 19 (09/30 0800) BP: (106-126)/(65-79) 106/78 (09/30 0800) SpO2:  [95 %-100 %] 98 % (09/30 0800) Weight:  [82.1 kg (181 lb)] 82.1 kg (181 lb) (09/30 0433)  Intake/Output from previous day: 09/29 0701 - 09/30 0700 In: 3434.6 [I.V.:3134.6; IV Piggyback:300] Out: 2260 [Urine:1475; Drains:785] Intake/Output this shift: Total I/O In: -  Out: 950 [Urine:950]  Physical Exam: Work of breathing is not labored.  VAC in place along with the two Foley's in his bowel.    Lab Results:  Results for orders placed or performed during the hospital encounter of 05/06/17 (from the past 48 hour(s))  Magnesium     Status: Abnormal   Collection Time: 07/25/17  4:02 AM  Result Value Ref Range   Magnesium 1.5 (L) 1.7 - 2.4 mg/dL  Phosphorus     Status: Abnormal   Collection Time: 07/25/17  4:02 AM  Result Value Ref Range   Phosphorus 5.4 (H) 2.5 - 4.6 mg/dL  Basic metabolic panel     Status: Abnormal   Collection Time: 07/25/17  4:02 AM  Result Value Ref Range   Sodium 138 135 - 145 mmol/L   Potassium 4.9 3.5 - 5.1 mmol/L    Comment: DELTA CHECK NOTED   Chloride 102 101 - 111 mmol/L   CO2 29 22 - 32 mmol/L   Glucose, Bld 157 (H) 65 - 99 mg/dL   BUN 25 (H) 6 - 20 mg/dL   Creatinine, Ser 0.77 0.61 - 1.24 mg/dL   Calcium 6.2 (LL) 8.9 - 10.3 mg/dL    Comment: DELTA CHECK NOTED CRITICAL RESULT CALLED TO, READ BACK BY AND VERIFIED WITH: A SAWYERS AT 0507 ON 09.29.2018 BY NBROOKS    GFR calc non Af Amer >60 >60 mL/min   GFR calc Af Amer >60 >60 mL/min    Comment: (NOTE) The eGFR has been  calculated using the CKD EPI equation. This calculation has not been validated in all clinical situations. eGFR's persistently <60 mL/min signify possible Chronic Kidney Disease.    Anion gap 7 5 - 15  CBC     Status: Abnormal   Collection Time: 07/25/17  4:02 AM  Result Value Ref Range   WBC 8.1 4.0 - 10.5 K/uL   RBC 3.47 (L) 4.22 - 5.81 MIL/uL   Hemoglobin 8.4 (L) 13.0 - 17.0 g/dL   HCT 26.3 (L) 39.0 - 52.0 %   MCV 75.8 (L) 78.0 - 100.0 fL   MCH 24.2 (L) 26.0 - 34.0 pg   MCHC 31.9 30.0 - 36.0 g/dL   RDW 17.0 (H) 11.5 - 15.5 %   Platelets 578 (H) 150 - 400 K/uL  Calcium     Status: Abnormal   Collection Time: 07/25/17  7:35 AM  Result Value Ref Range   Calcium 8.7 (L) 8.9 - 10.3 mg/dL    Comment: DELTA CHECK NOTED  Basic metabolic panel     Status: Abnormal   Collection Time: 07/26/17  9:12 AM  Result Value Ref Range   Sodium 140 135 - 145 mmol/L  Potassium 3.7 3.5 - 5.1 mmol/L    Comment: DELTA CHECK NOTED   Chloride 104 101 - 111 mmol/L   CO2 27 22 - 32 mmol/L   Glucose, Bld 107 (H) 65 - 99 mg/dL   BUN 26 (H) 6 - 20 mg/dL   Creatinine, Ser 0.83 0.61 - 1.24 mg/dL   Calcium 8.9 8.9 - 10.3 mg/dL   GFR calc non Af Amer >60 >60 mL/min   GFR calc Af Amer >60 >60 mL/min    Comment: (NOTE) The eGFR has been calculated using the CKD EPI equation. This calculation has not been validated in all clinical situations. eGFR's persistently <60 mL/min signify possible Chronic Kidney Disease.    Anion gap 9 5 - 15  Magnesium     Status: Abnormal   Collection Time: 07/26/17  9:12 AM  Result Value Ref Range   Magnesium 1.5 (L) 1.7 - 2.4 mg/dL  Phosphorus     Status: Abnormal   Collection Time: 07/26/17  9:12 AM  Result Value Ref Range   Phosphorus 5.0 (H) 2.5 - 4.6 mg/dL    Radiology/Results: No results found.  Anti-infectives: Anti-infectives    Start     Dose/Rate Route Frequency Ordered Stop   07/08/17 1600  fluconazole (DIFLUCAN) IVPB 100 mg     100 mg 50 mL/hr over  60 Minutes Intravenous Every 24 hours 07/08/17 0828 07/14/17 1710   07/01/17 1500  fluconazole (DIFLUCAN) IVPB 100 mg     100 mg 50 mL/hr over 60 Minutes Intravenous Every 24 hours 07/01/17 1401 07/07/17 1715   06/16/17 1200  piperacillin-tazobactam (ZOSYN) IVPB 3.375 g     3.375 g 12.5 mL/hr over 240 Minutes Intravenous Every 8 hours 06/16/17 1026 06/17/17 0004   06/08/17 1200  piperacillin-tazobactam (ZOSYN) IVPB 3.375 g  Status:  Discontinued     3.375 g 12.5 mL/hr over 240 Minutes Intravenous Every 8 hours 06/08/17 1056 06/16/17 1026   05/29/17 2200  ceFEPIme (MAXIPIME) 2 g in dextrose 5 % 50 mL IVPB  Status:  Discontinued     2 g 100 mL/hr over 30 Minutes Intravenous Every 8 hours 05/29/17 2031 06/03/17 0832   05/29/17 2100  metroNIDAZOLE (FLAGYL) IVPB 500 mg  Status:  Discontinued     500 mg 100 mL/hr over 60 Minutes Intravenous Every 8 hours 05/29/17 2029 06/03/17 0832   05/26/17 2200  ceFAZolin (ANCEF) IVPB 1 g/50 mL premix  Status:  Discontinued     1 g 100 mL/hr over 30 Minutes Intravenous Every 8 hours 05/26/17 1008 05/29/17 2023   05/25/17 1100  vancomycin (VANCOCIN) 1,250 mg in sodium chloride 0.9 % 250 mL IVPB  Status:  Discontinued     1,250 mg 166.7 mL/hr over 90 Minutes Intravenous Every 12 hours 05/25/17 0955 05/26/17 0946   05/22/17 1400  anidulafungin (ERAXIS) 100 mg in sodium chloride 0.9 % 100 mL IVPB     100 mg 78 mL/hr over 100 Minutes Intravenous Every 24 hours 05/21/17 1330 05/27/17 1740   05/21/17 1400  anidulafungin (ERAXIS) 200 mg in sodium chloride 0.9 % 200 mL IVPB     200 mg 78 mL/hr over 200 Minutes Intravenous  Once 05/21/17 1330 05/21/17 1940   05/15/17 1000  anidulafungin (ERAXIS) 100 mg in sodium chloride 0.9 % 100 mL IVPB  Status:  Discontinued     100 mg 78 mL/hr over 100 Minutes Intravenous Every 24 hours 05/14/17 0829 05/16/17 0901   05/15/17 1000  metroNIDAZOLE (FLAGYL) IVPB 500 mg  Status:  Discontinued     500 mg 100 mL/hr over 60 Minutes  Intravenous Every 8 hours 05/15/17 0903 05/25/17 0938   05/14/17 0900  anidulafungin (ERAXIS) 200 mg in sodium chloride 0.9 % 200 mL IVPB     200 mg 78 mL/hr over 200 Minutes Intravenous  Once 05/14/17 0829 05/14/17 1325   05/13/17 1927  vancomycin (VANCOCIN) 1-5 GM/200ML-% IVPB    Comments:  Ward, Christa   : cabinet override      05/13/17 1927 05/14/17 0744   05/12/17 1400  ceFEPIme (MAXIPIME) 1 g in dextrose 5 % 50 mL IVPB     1 g 100 mL/hr over 30 Minutes Intravenous Every 8 hours 05/12/17 0939 05/26/17 2359   05/12/17 0900  vancomycin (VANCOCIN) IVPB 1000 mg/200 mL premix  Status:  Discontinued     1,000 mg 200 mL/hr over 60 Minutes Intravenous Every 12 hours 05/12/17 0750 05/16/17 0852   05/12/17 0830  aztreonam (AZACTAM) 2 GM IVPB     2 g 100 mL/hr over 30 Minutes Intravenous  Once 05/12/17 0800 05/12/17 0957   05/06/17 0916  vancomycin (VANCOCIN) IVPB 1000 mg/200 mL premix     1,000 mg 200 mL/hr over 60 Minutes Intravenous On call to O.R. 05/06/17 0916 05/06/17 1229      Assessment/Plan: Problem List: Patient Active Problem List   Diagnosis Date Noted  . Hypomagnesemia 06/28/2017  . Hypothyroidism   . 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 Protein-calorie malnutrition, severe ([place]) 06/27/2017  . 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 Pressure injury of skin 05/29/2017  . Ileus ([place])   . Hypokalemia 05/14/2017  . Anastomotic leak of intestine 05/14/2017  . Wound dehiscence, surgical 05/14/2017  . Aspiration pneumonia ([place]) 05/14/2017  . Chronic narcotic use 05/10/2017  . Generalized anxiety disorder 05/10/2017  . Intra-abdominal adhesions s/p SB resection 05/06/2017 05/09/2017  . Throat symptom 03/18/2016  . Multinodular goiter 01/19/2016  . Hyperthyroidism 01/19/2016  . Essential hypertension   . Diarrhea 11/30/2014  . Anemia, iron deficiency 11/01/2014  . Leukocytosis 11/03/2013  . Tobacco abuse 07/26/2013  . COPD  (chronic obstructive pulmonary disease) ([place]) 07/26/2013  . Left knee pain 07/26/2013  . Pain in joint, shoulder region 05/03/2013  . GERD 07/24/2008  . 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    Requesting to go back on PCA Dilaudid for pain.  Ordered.   67 Days Post-Op    LOS: 81 days   Matt B. Hassell Done, MD, [facility] [phone] beeper [phone]  07/26/2017 11:15 AM

## 2017-07-26 NOTE — Progress Notes (Signed)
[place] NOTE   Pharmacy Consult for TPN Indication: prolonged ileus, small bowel leak, multiple bowel surgeries  Patient Measurements: Body mass index is 25.97 kg/m. Filed Weights   07/24/17 0500 07/25/17 0422 07/26/17 0433  Weight: 174 lb 9.7 oz (79.2 kg) 178 lb 5.6 oz (80.9 kg) 181 lb (82.1 kg)   HPI: 57 yoM admitted on 7/11 for enlarging adrenal mass and planned adrenalectomy.  Pharmacy consulted to dose TPN.  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   7/22 OR:  wash out, tube ileostomy, left with open abdomen & wound vac in place.  Plan for OR on 7/25 for closure 7/25 OR: mesh placed for abdominal closure, wound vac 8/2 extubated, propofol drip off 8/4 TPN off for ~ 5 hours due to line detached from filter 8/10 MD request decrease rate x 1 week 2nd low sodium 8/12 Additional sodium added to TPN due to persistent hyponatremia 8/16 Increasing rate back to goal since sodium has improved with above intervention.   8/20 Prealbumin decreased.  New apparent enteric leak per surgery notes. 8/21 CaPhos product elevated, remove electrolytes from TPN 8/25 PICC did not have blood return in 2 of the 3 ports.  Alteplase intra-catheter and function returned.  TPN infusing correctly on 8/26 per RN. 8/28: d/c SSI 8/30: added electrolytes to TPN 8/31: TPN IV line came out around ~0340 and current bag was stopped at this time.  Restart new clinimix 5/15 1L bag (no lytes since phos is elevated this morning) until next bag is due at [facility]. Will not add MVI and trace elements to this 1L bag. Use  electrolyte free clinimix with new bag at [facility] 9/1 NGT out 9/3 added electrolytes to TPN 9/4 lytes out of TPN 9/7 cycle over 18 hrs 9/8 add back electrolytes to TPN and cycle over 14 hours 9/9 lytes out of TPN, continue 14 hr cycle, DC SSI/CBGs 9/13 electrolytes added back to TPN 9/14 remove electrolytes from TPN  Plan fistula-repair surgery on 10/5  Insulin requirements past 24 hours: none (no hx DM)  Current Nutrition: NPO  IVF: none. Lasix 20 IV every other day  Central access: PICC line ordered 7/19, placed on 7/20 TPN start date: 7/20  ASSESSMENT                                                                                                           Today, 07/26/17  - Glucose (goal <150): at goal -  Electrolytes:  K down 3.7  (goal > 4 for ileus) Mag 1.5 (goal > 2 for ileus), remains low despite daily aggressive replacement. (didn't improve with extended infusion Mag sulfate) Na 140 - normalized after increasing sodium in TPN. Phos elevated at  5 - with electrolytes removed from TPN, CorrCa 10.34 , Ca/phos product 51 (goal < 55) - Renal:  SCr wnl - LFTs: WNL except Alk Phos which is elevated but improving - TGs:   206 (9/3), 227 (9/10), 195 (9/16), 193 (9/24) - Prealbumin:  <5 (7/20), 6.1 (7/24), 10.1 (7/30) 24.8 (8/6), 26.4 (8/13), 15.9 (8/20), 19.7 (8/27) 24.7 (9/3) 23.7 (9/10) 18.5 (9/17), 11.5 (9/24)  NUTRITIONAL GOALS                                                                                             RD recs (9/27):  122-138 gms Protein (1.5 - 1.7 g/kg) 2030 - 2270 Kcal  Recalculation: Clinimix 5/15 over 14h cycle with lipids 3x/week on MWF only would provide: 132g protein and avg 2080 kcal/day   PLAN      Now: - potassium chloride 10 meq IV x3 runs - Mag sulfate 4gm IV q4h x3 doses   At 1800 today: ? Clinimix 5/15 (electrolyte-free formulation) for total of 268m/day to cycle over 14hrs ? Add sodium to TPN (add 125 mEq of Na/L-- 70 mEq/L NaCl and 55  mEq/L Na Acetate) for hyponatremia ? Lipids at 280m/hr x 12 hours 3 times weekly on MWF  MVI/TE in TPN and pepcid 40 mg/day in TPN  TPN lab panels on Mondays & Thursdays.  BMET, Mag & Phos daily ordered.  AnDia SitterPharmD, BCPS 07/26/2017 7:47 AM

## 2017-07-27 LAB — CBC WITH DIFFERENTIAL/PLATELET
BASOS PCT: 0 %
Basophils Absolute: 0 10*3/uL (ref 0.0–0.1)
EOS ABS: 0.3 10*3/uL (ref 0.0–0.7)
Eosinophils Relative: 3 %
HCT: 26.9 % — ABNORMAL LOW (ref 39.0–52.0)
Hemoglobin: 8.6 g/dL — ABNORMAL LOW (ref 13.0–17.0)
Lymphocytes Relative: 28 %
Lymphs Abs: 2.7 10*3/uL (ref 0.7–4.0)
MCH: 24 pg — ABNORMAL LOW (ref 26.0–34.0)
MCHC: 32 g/dL (ref 30.0–36.0)
MCV: 74.9 fL — ABNORMAL LOW (ref 78.0–100.0)
MONO ABS: 0.8 10*3/uL (ref 0.1–1.0)
MONOS PCT: 8 %
Neutro Abs: 5.7 10*3/uL (ref 1.7–7.7)
Neutrophils Relative %: 61 %
Platelets: 547 10*3/uL — ABNORMAL HIGH (ref 150–400)
RBC: 3.59 MIL/uL — ABNORMAL LOW (ref 4.22–5.81)
RDW: 17.1 % — AB (ref 11.5–15.5)
WBC: 9.6 10*3/uL (ref 4.0–10.5)

## 2017-07-27 LAB — COMPREHENSIVE METABOLIC PANEL
ALBUMIN: 2.4 g/dL — AB (ref 3.5–5.0)
ALT: 21 U/L (ref 17–63)
ANION GAP: 8 (ref 5–15)
AST: 27 U/L (ref 15–41)
Alkaline Phosphatase: 150 U/L — ABNORMAL HIGH (ref 38–126)
BILIRUBIN TOTAL: 0.5 mg/dL (ref 0.3–1.2)
BUN: 24 mg/dL — ABNORMAL HIGH (ref 6–20)
CO2: 27 mmol/L (ref 22–32)
Calcium: 9 mg/dL (ref 8.9–10.3)
Chloride: 105 mmol/L (ref 101–111)
Creatinine, Ser: 0.78 mg/dL (ref 0.61–1.24)
GFR calc Af Amer: >60 mL/min (ref >60)
GFR calc non Af Amer: >60 mL/min (ref >60)
GLUCOSE: 109 mg/dL — AB (ref 65–99)
POTASSIUM: 3.8 mmol/L (ref 3.5–5.1)
Sodium: 140 mmol/L (ref 135–145)
TOTAL PROTEIN: 6.6 g/dL (ref 6.5–8.1)

## 2017-07-27 LAB — PREALBUMIN: PREALBUMIN: 13.4 mg/dL — AB (ref 18–38)

## 2017-07-27 LAB — TRIGLYCERIDES: TRIGLYCERIDES: 111 mg/dL (ref <150)

## 2017-07-27 LAB — MAGNESIUM: MAGNESIUM: 1.7 mg/dL (ref 1.7–2.4)

## 2017-07-27 LAB — PHOSPHORUS: PHOSPHORUS: 5.5 mg/dL — AB (ref 2.5–4.6)

## 2017-07-27 MED ORDER — FAT EMULSION 20 % IV EMUL
240.0000 mL | INTRAVENOUS | Status: AC
  Administered 2017-07-27: 240 mL via INTRAVENOUS
  Filled 2017-07-27: qty 250

## 2017-07-27 MED ORDER — LIP MEDEX EX OINT: 1.0000 "application " | TOPICAL_OINTMENT | CUTANEOUS | Status: DC | PRN

## 2017-07-27 MED ORDER — POTASSIUM CHLORIDE 10 MEQ/100ML IV SOLN
10.0000 meq | INTRAVENOUS | Status: AC
  Administered 2017-07-27 (×4): 10 meq via INTRAVENOUS
  Filled 2017-07-27: qty 100

## 2017-07-27 MED ORDER — TRACE MINERALS CR-CU-MN-SE-ZN 10-1000-500-60 MCG/ML IV SOLN
INTRAVENOUS | Status: AC
  Administered 2017-07-27: 18:00:00 via INTRAVENOUS
  Filled 2017-07-27: qty 2644
  Filled 2017-07-27: qty 1000

## 2017-07-27 MED ORDER — MAGNESIUM SULFATE 4 GM/100ML IV SOLN
4.0000 g | INTRAVENOUS | Status: AC
  Administered 2017-07-27 (×3): 4 g via INTRAVENOUS
  Filled 2017-07-27 (×3): qty 100

## 2017-07-27 NOTE — Consult Note (Signed)
[place] Nurse wound follow up (Late entry for dressing change performed on Friday, 07/24/17) Wound type:Routine change of NPWT. Wife presnt for dressing change.  CCS PA B. Meuth present for dressing change. Measurement: See Tuesday, 07/22/27 measurements Wound bed: 70% red, 30% exposed mesh with tan drainage beneath Drainage (amount, consistency, odor) tan drainage beneath mesh Periwound: intact, minor maceration from 3-5 o'clock Dressing procedure/placement/frequency: Dressing removed using spray medical adhesive releaser product. Wound cleansed with NS, and gently patted dry. Drape applied to the periwound tissue. One piece of white foam used to cover 80% of wound bed, one piece of black foam used to cover entire wound bed. Drape applied.  One piece of black foam used to make a bridge for [facility] pad. (2 pieces black foam total, one piece of white foam). Cannister changed.  Patient initially reported that he felt a "stinging sensation" at 3 o'clock and this writer was prepared to change the dressing/move the [facility] pad.  When I entered the room, the patient reported the sensation had passed and he no longer needed a dressing change.  Next dressing change is due 07/27/17. [place] nursing team will follow, and will remain available to this patient, the nursing, surgical and medical teams.   Thanks, Maudie Flakes, MSN, RN, [place], Arther Abbott  Pager# [phone]

## 2017-07-27 NOTE — Progress Notes (Signed)
[place] NOTE   Pharmacy Consult for TPN Indication: prolonged ileus, small bowel leak, multiple bowel surgeries  Patient Measurements: Body mass index is 25.27 kg/m. Filed Weights   07/25/17 0422 07/26/17 0433 07/27/17 0356  Weight: 178 lb 5.6 oz (80.9 kg) 181 lb (82.1 kg) 176 lb 2.4 oz (79.9 kg)   HPI: 32 yoM admitted on 7/11 for enlarging adrenal mass and planned adrenalectomy.  Pharmacy consulted to dose TPN.  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   7/22 OR:  wash out, tube ileostomy, left with open abdomen & wound vac in place.  Plan for OR on 7/25 for closure 7/25 OR: mesh placed for abdominal closure, wound vac 8/2 extubated, propofol drip off 8/4 TPN off for ~ 5 hours due to line detached from filter 8/10 MD request decrease rate x 1 week 2nd low sodium 8/12 Additional sodium added to TPN due to persistent hyponatremia 8/16 Increasing rate back to goal since sodium has improved with above intervention.   8/20 Prealbumin decreased.  New apparent enteric leak per surgery notes. 8/21 CaPhos product elevated, remove electrolytes from TPN 8/25 PICC did not have blood return in 2 of the 3 ports.  Alteplase intra-catheter and function returned.  TPN infusing correctly on 8/26 per RN. 8/28: d/c SSI 8/30: added electrolytes to TPN 8/31: TPN IV line came out around ~0340 and current bag was stopped at this time.  Restart new clinimix 5/15 1L bag (no lytes since phos is elevated this morning) until next bag is due at [facility]. Will not add MVI and trace elements to this 1L bag. Use  electrolyte free clinimix with new bag at [facility] 9/1 NGT out 9/3 added electrolytes to TPN 9/4 lytes out of TPN 9/7 cycle over 18 hrs 9/8 add back electrolytes to TPN and cycle over 14 hours 9/9 lytes out of TPN, continue 14 hr cycle, DC SSI/CBGs 9/13 electrolytes added back to TPN 9/14 remove electrolytes from TPN  Plan fistula-repair surgery on 10/5  Insulin requirements past 24 hours: none (no hx DM)  Current Nutrition: NPO  IVF: none. Lasix 20 IV q48h.  Central access: PICC line ordered 7/19, placed on 7/20 TPN start date: 7/20  ASSESSMENT                                                                                                           Today, 07/27/17  - Glucose (goal <150): at goal -  Electrolytes:  K 3.8  (goal > 4 for ileus), replacing IV daily Mag 1.7 (goal > 2 for ileus), remains low despite daily aggressive replacement (didn't improve with extended infusion Mag sulfate on 9/28) Na 140 - normalized and stable with additional sodium in  TPN. Phos elevated and increased at 5.5 despite electrolytes removed from TPN, CorrCa 10.28 , Ca/phos product 56.5 (goal < 55)   - Renal:  SCr wnl - LFTs: WNL except Alk Phos which is elevated - TGs:   206 (9/3), 227 (9/10), 195 (9/16), 193 (9/24), pending (10/1) - Prealbumin:  <5 (7/20), 6.1 (7/24), 10.1 (7/30) 24.8 (8/6), 26.4 (8/13), 15.9 (8/20), 19.7 (8/27) 24.7 (9/3) 23.7 (9/10) 18.5 (9/17), 11.5 (9/24), pending (10/1)  NUTRITIONAL GOALS                                                                                             RD recs (9/27):  122-138 gms Protein (1.5 - 1.7 g/kg) 2030 - 2270 Kcal  Recalculation: Clinimix 5/15 2644 mL over 14h cycle with lipids 3x/week on MWF only would provide: 132g protein and avg 2080 kcal/day  Glucose infusion rate will be 6.6 mg/kg/min at max cyclic rate.   PLAN                                                                                                                          Now: - KCl 10 meq IV x4 runs - Mag sulfate 4gm IV q4h x3 doses  At 1800 today:  Clinimix 5/15 (electrolyte-free formulation) for total of 2644 mL/day to cycle over 14hrs  Cycle: 50 ml/hr x 1 hr 6p-7p, 212 ml/hr x 12 hrs 7p-7a, 50 ml/hr x 1 hr 7a-8a, then off  Add sodium 125 mEq/L to TPN: add as 70 mEq/L NaCl and 55 mEq/L Na Acetate   Lipids at 31ms/hr x 12 hours 3 times weekly on MWF  Standard MVI, Trace elements, and pepcid 40 mg/day in TPN  TPN lab panels on Mondays & Thursdays.  BMET, Mag & Phos daily ordered.  CGretta ArabPharmD, BCPS Pager [phone]/10/2016 10:51 AM

## 2017-07-27 NOTE — Progress Notes (Signed)
While walking with PT patient experienced anxiety, pain and nausea. Gave PRN medications and currently resting in chair with family at bedside. Will continue to monitor.

## 2017-07-27 NOTE — Progress Notes (Signed)
   07/27/17 1500  Clinical Encounter Type  Visited With Patient and family together  Visit Type Follow-up  Spiritual Encounters  Spiritual Needs Emotional;Prayer  Stress Factors  Patient Stress Factors (Thinking about upcoming surgery)   Continue to provide support and care for the patient and spouse.  Expressed some concern about upcoming surgery in terms of just going through it, but is ready to get it behind him.  Will follow as needed. Chaplain Katherene Ponto

## 2017-07-27 NOTE — Consult Note (Signed)
[place] Nurse wound follow up [place] Nurse wound consult note NPWT VAC dressing change.   Has been medicated for wound related pain. Wound type: Surgical Pressure Injury POA:N/A Measurement:4 x 16 x 1.3cm Wound bed:70% red, 30% with exposed mesh with tan effluent coming through mesh. Drainage (amount, consistency, odor) Tan effluent in tubing. Periwound: Some periwound maceration from 9-3 o'clock and from 3-7 o'clock.. Dressing procedure/placement/frequency: Spray adhesive released used to remove old dressing, wound cleansed and patted dry.  Drape applied to periwound tissue.1 piece of white foam used to cover 80% of wound bed, 1 piece black foam used to cover entire wound bed. Drape applied to seal foam. One piece of black foam used to make a bridge to the 6 o'clock position so that T.R.A.C. pad would not rest on patient's ribcage. This foam was covered with drape and an opening made to place the T.R.A.C pad.  Attached to negative pressure at 163mmHg continuous pressure and an immediate seal is achieved.  Patient complains of tightness with dressing change.  [place] nursing team will follow.  Domenic Moras RN BSN [place] Pager [phone]

## 2017-07-27 NOTE — Progress Notes (Signed)
Occupational Therapy Treatment Patient Details Name: [name] [name] MRN: [id number] DOB: 05-26-1961 Today's Date: 07/27/2017    History of present illness  56 y.o. 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 On 7/18, he developed a leak therefore was taken back to OR for re-do of ex-lap and repair of leak on 05/20/17.  He returned to the ICU on the vent . Extubated on 05/28/17.    OT comments  Pt is making steady progress with increasing activity tolerance.  He demonstrated HR to 154 while ambulating.  He reports feeling like he was having a panic attack - RN notified.   He is able to perform ADLs with min - mod A.  Will continue to follow..   Follow Up Recommendations  SNF    Equipment Recommendations  3 in 1 bedside commode    Recommendations for Other Services      Precautions / Restrictions Precautions Precautions: Fall Precaution Comments: multiple lines in abdomen, VAC, 2 ABD drains , and JP drain Restrictions Weight Bearing Restrictions: No       Mobility Bed Mobility Overal bed mobility: Needs Assistance Bed Mobility: Rolling;Sidelying to Sit Rolling: Supervision Sidelying to sit: Min assist       General bed mobility comments: Pt's wife jumpted in to assist him, and appeared to over assist him   Transfers Overall transfer level: Needs assistance Equipment used: Rolling walker (2 wheeled) Transfers: Sit to/from [facility] Sit to Stand: Supervision Stand pivot transfers: Min guard            Balance Overall balance assessment: Needs assistance Sitting-balance support: Feet supported Sitting balance-Leahy Scale: Good     Standing balance support: During functional activity Standing balance-Leahy Scale: Fair                             ADL  either performed or assessed with clinical judgement   ADL Overall ADL's : Needs assistance/impaired                         Toilet Transfer: Min guard;Ambulation;Comfort height toilet;RW;Grab bars   Toileting- Clothing Manipulation and Hygiene: Minimal assistance;Sit to/from stand               Vision       Perception     Praxis      Cognition Arousal/Alertness: Awake/alert Behavior During Therapy: WFL for tasks assessed/performed Overall Cognitive Status: Within Functional Limits for tasks assessed                                          Exercises     Shoulder Instructions       General Comments HR increased to 154 while ambulating in hallway then dropped into the 130s and slowly down into the 110's.  Pt reports he feels he was having a panic attack - RN notified and provided meds     Pertinent Vitals/ Pain       Pain Assessment: Faces Faces Pain Scale: Hurts a little bit Pain Location: ABD VAC location Pain Descriptors / Indicators: Grimacing Pain Intervention(s): Monitored during session  Home Living  Prior Functioning/Environment              Frequency  Min 2X/week        Progress Toward Goals  OT Goals(current goals can now be found in the care plan section)  Progress towards OT goals: Progressing toward goals  Acute Rehab OT Goals Patient Stated Goal: to go fishing OT Goal Formulation: With patient Time For Goal Achievement: 08/10/17 Potential to Achieve Goals: Good  Plan Discharge plan remains appropriate    Co-evaluation                 AM-PAC PT "6 Clicks" Daily Activity     Outcome Measure   Help from another person eating meals?: A Little Help from another person taking care of personal grooming?: A Little Help from another person toileting, which includes using toliet, bedpan, or urinal?: A Little Help from another person bathing  (including washing, rinsing, drying)?: A Lot Help from another person to put on and taking off regular upper body clothing?: A Little Help from another person to put on and taking off regular lower body clothing?: A Lot 6 Click Score: 16    End of Session Equipment Utilized During Treatment: Rolling walker  OT Visit Diagnosis: Unsteadiness on feet (R26.81);Muscle weakness (generalized) (M62.81)   Activity Tolerance Other (comment) (anxiety and elevated HR )   Patient Left in chair;with call bell/phone within reach;with family/visitor present;with nursing/sitter in room   Nurse Communication Mobility status        Time: 5176-1607 OT Time Calculation (min): 44 min  Charges: OT General Charges $OT Visit: 1 Visit OT Treatments $Therapeutic Activity: 38-52 mins  [facility], OTR/L [phone]    [name] 07/27/2017, 6:33 PM

## 2017-07-27 NOTE — Progress Notes (Signed)
General Surgery [facility]  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 Kaylyn Lim and Dr. Annye English  Problem List:     Patient Active Problem List   Diagnosis Date Noted  . Hypomagnesemia 06/28/2017  . Hypothyroidism   . 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 Protein-calorie malnutrition, severe ([place]) 06/27/2017  . 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 Pressure injury of skin 05/29/2017  . Ileus ([place])   . Hypokalemia 05/14/2017  . Anastomotic leak of intestine 05/14/2017  . Wound dehiscence, surgical 05/14/2017  . Aspiration pneumonia ([place]) 05/14/2017  . Chronic narcotic use 05/10/2017  . Generalized anxiety disorder 05/10/2017  . Intra-abdominal adhesions s/p SB resection 05/06/2017 05/09/2017  . Throat symptom 03/18/2016  . Multinodular goiter 01/19/2016  . Hyperthyroidism 01/19/2016  . Essential hypertension   . Diarrhea 11/30/2014  . Anemia, iron deficiency 11/01/2014  . Leukocytosis 11/03/2013  . Tobacco abuse 07/26/2013  . COPD (chronic obstructive pulmonary disease) ([place]) 07/26/2013  . Left knee pain 07/26/2013  . Pain in joint, shoulder region 05/03/2013  . GERD 07/24/2008  . 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    PCA Dilaudid for pain per patient request.  Encouraged OOB, ambulation.  Pre-op orders per Dr. Hassell Done.        Earnstine Regal, MD, [facility]       Office: [phone]    Chief Complaint: Enterocutaneous fistulae, open wound  Subjective: Patient in bed, comfortable.  No complaints.  Objective: Vital signs in last 24 hours: Temp:  [98.1 F (36.7 C)-98.9 F (37.2 C)] 98.5 F (36.9 C) (10/01 0700) Pulse Rate:  [86-100] 90 (10/01 0400) Resp:  [11-23] 22 (10/01 0400) BP:  (97-124)/(59-80) 124/77 (10/01 0400) SpO2:  [95 %-99 %] 99 % (10/01 0400) Weight:  [79.9 kg (176 lb 2.4 oz)] 79.9 kg (176 lb 2.4 oz) (10/01 0356) Last BM Date: 07/15/17  Intake/Output from previous day: 09/30 0701 - 10/01 0700 In: 2008.1 [I.V.:2008.1] Out: 1845 [Urine:1225; Drains:620] Intake/Output this shift: No intake/output data recorded.  Physical Exam: HEENT - sclerae clear, mucous membranes moist Neck - soft Chest - clear bilaterally Cor - RRR Abdomen - soft, VAC intact RUQ; drains with cloudy, greenish output, slight odor Ext - no edema, non-tender Neuro - alert & oriented, no focal deficits  Lab Results:   Recent Labs  07/25/17 0402  WBC 8.1  HGB 8.4*  HCT 26.3*  PLT 578*   BMET  Recent Labs  07/25/17 0402 07/25/17 0735 07/26/17 0912  NA 138  --  140  K 4.9  --  3.7  CL 102  --  104  CO2 29  --  27  GLUCOSE 157*  --  107*  BUN 25*  --  26*  CREATININE 0.77  --  0.83  CALCIUM 6.2* 8.7* 8.9   PT/INR No results for input(s): LABPROT, INR in the last 72 hours. Comprehensive Metabolic Panel:    Component Value Date/Time   NA 140 07/26/2017 0912   NA 138 07/25/2017 0402   K 3.7 07/26/2017 0912   K 4.9 07/25/2017 0402   CL 104 07/26/2017 0912   CL 102 07/25/2017 0402   CO2 27 07/26/2017 0912   CO2 29 07/25/2017 0402   BUN 26 (H) 07/26/2017  0912   BUN 25 (H) 07/25/2017 0402   CREATININE 0.83 07/26/2017 0912   CREATININE 0.77 07/25/2017 0402   CREATININE 0.85 07/22/2013 1021   GLUCOSE 107 (H) 07/26/2017 0912   GLUCOSE 157 (H) 07/25/2017 0402   CALCIUM 8.9 07/26/2017 0912   CALCIUM 8.7 (L) 07/25/2017 0735   AST 21 07/23/2017 0500   AST 18 07/20/2017 0500   ALT 18 07/23/2017 0500   ALT 17 07/20/2017 0500   ALKPHOS 141 (H) 07/23/2017 0500   ALKPHOS 151 (H) 07/20/2017 0500   BILITOT 0.2 (L) 07/23/2017 0500   BILITOT 0.5 07/20/2017 0500   PROT 6.0 (L) 07/23/2017 0500   PROT 6.1 (L) 07/20/2017 0500   ALBUMIN 2.2 (L) 07/23/2017 0500   ALBUMIN  2.2 (L) 07/20/2017 0500    Studies/Results: No results found.    [name] [name] M 07/27/2017  Patient ID: [name] [name], male   DOB: [birth year], 56 y.o.   MRN: [id number]

## 2017-07-27 NOTE — Care Management Note (Signed)
Case Management Note  Patient Details  Name: [name] [name] MRN: [id number] Date of Birth: 01-22-1961  Subjective/Objective:                  Next or day [id number]  Action/Plan: Date:  July 27, 2017 Chart reviewed for concurrent status and case management needs.  Will continue to follow patient progress.  Discharge Planning: following for needs  Expected discharge date: July 30, 2017  [name], BSN, [place], [place]   Expected Discharge Date:                  Expected Discharge Plan:  Home/Self Care  In-House Referral:     Discharge planning Services  CM Consult  Post Acute Care Choice:    Choice offered to:     DME Arranged:    DME Agency:     HH Arranged:    HH Agency:     Status of Service:  In process, will continue to follow  If discussed at Long Length of Stay Meetings, dates discussed:    Additional Comments:  Leeroy Cha, RN 07/27/2017, 9:54 AM

## 2017-07-27 NOTE — Progress Notes (Signed)
Helped wound RN with wound vac change. Applied dressings to foley sites and JP drain. Encouraged use of the PCA pump for better pain management. Patient also refused valium after wasted medication with [facility]. Wasted the reminder in the sink with [facility]. Tolerated well. Will continue to monitor.

## 2017-07-28 LAB — COMPREHENSIVE METABOLIC PANEL
ALBUMIN: 2.1 g/dL — AB (ref 3.5–5.0)
ALT: 18 U/L (ref 17–63)
ANION GAP: 6 (ref 5–15)
AST: 22 U/L (ref 15–41)
Alkaline Phosphatase: 133 U/L — ABNORMAL HIGH (ref 38–126)
BILIRUBIN TOTAL: 0.4 mg/dL (ref 0.3–1.2)
BUN: 21 mg/dL — AB (ref 6–20)
CHLORIDE: 108 mmol/L (ref 101–111)
CO2: 27 mmol/L (ref 22–32)
Calcium: 8.2 mg/dL — ABNORMAL LOW (ref 8.9–10.3)
Creatinine, Ser: 0.7 mg/dL (ref 0.61–1.24)
GFR calc Af Amer: >60 mL/min (ref >60)
GFR calc non Af Amer: >60 mL/min (ref >60)
GLUCOSE: 112 mg/dL — AB (ref 65–99)
POTASSIUM: 3.3 mmol/L — AB (ref 3.5–5.1)
Sodium: 141 mmol/L (ref 135–145)
TOTAL PROTEIN: 5.8 g/dL — AB (ref 6.5–8.1)

## 2017-07-28 LAB — PHOSPHORUS: PHOSPHORUS: 4 mg/dL (ref 2.5–4.6)

## 2017-07-28 LAB — MAGNESIUM: MAGNESIUM: 2.2 mg/dL (ref 1.7–2.4)

## 2017-07-28 MED ORDER — CHLORHEXIDINE GLUCONATE CLOTH 2 % EX PADS
6.0000 | MEDICATED_PAD | Freq: Every day | CUTANEOUS | Status: AC
  Administered 2017-07-28 – 2017-08-01 (×4): 6 via TOPICAL

## 2017-07-28 MED ORDER — POTASSIUM CHLORIDE 10 MEQ/50ML IV SOLN
10.0000 meq | INTRAVENOUS | Status: AC
  Administered 2017-07-28 (×6): 10 meq via INTRAVENOUS
  Filled 2017-07-28 (×6): qty 50

## 2017-07-28 MED ORDER — MUPIROCIN 2 % EX OINT
1.0000 "application " | TOPICAL_OINTMENT | Freq: Two times a day (BID) | CUTANEOUS | Status: DC
  Administered 2017-07-28 – 2017-07-30 (×6): 1 via NASAL
  Filled 2017-07-28: qty 22

## 2017-07-28 MED ORDER — TRACE MINERALS CR-CU-MN-SE-ZN 10-1000-500-60 MCG/ML IV SOLN
INTRAVENOUS | Status: AC
  Administered 2017-07-28: 18:00:00 via INTRAVENOUS
  Filled 2017-07-28: qty 2644
  Filled 2017-07-28: qty 1000

## 2017-07-28 MED ORDER — MAGNESIUM SULFATE 4 GM/100ML IV SOLN
4.0000 g | Freq: Four times a day (QID) | INTRAVENOUS | Status: AC
  Administered 2017-07-28 (×2): 4 g via INTRAVENOUS
  Filled 2017-07-28 (×2): qty 100

## 2017-07-28 NOTE — Progress Notes (Signed)
Nutrition Follow-up  DOCUMENTATION CODES:   Not applicable  INTERVENTION:  - Continue cyclic TPN per Pharmacy. - RD will monitor for nutrition-related plan following surgery on 10/5.  NUTRITION DIAGNOSIS:   Inadequate oral intake related to inability to eat as evidenced by NPO status. -ongoing  GOAL:   Patient will meet greater than or equal to 90% of their needs -met with TPN regimen.  MONITOR:   Diet advancement, Weight trends, Labs, Skin, I & O's, Other (Comment) (TPN regimen)  ASSESSMENT:   56 y.o. 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 On 7/18, he developed a leak therefore was taken back to OR for re-do of ex-lap and repair of leak.  Significant events: 7/11 CV:ELFYB adrenalectomy with complication of intestinal injury and small bowel resection with anastomosis  7/18 OF:BPZWCH of small bowel anastomotic leak, abdominal closure of wound dehisence 7/19 OR: reopening of recent laparotomy, lysis of adhesions, noted ischemic perforation of new loop of intestine, intact repair of previous anastomotic leak repair, intact previous anastomosis.  7/22 OR: wash out, tube ileostomy, left with open abdomen &wound vac in place. Plan for OR on 7/25 for closure 7/25 OR: mesh placed for abdominal closure, wound vac 8/2extubated, propofol drip off 8/4TPN off for ~ 5 hours due to line detached from filter. Ultrasound-guided aspiration By interventional radiology of small perihepatic fluid collection yielded only 1 mL of the fluid, sent for culture. NPO - mild coffee ground on NG but no active bleed 8/6patient improving. Off Precedex drip since 8/4. Dilaudid Drip being changed over to PCA. Still with some coffee ground-looking material in NG tube.  8/10 hyponatremia and plan to decrease TPN from  3L/day to 2L/day x1 week 8/1fund to have fecal matter in wound vac (EC fistula) 8/14increase in fecal matter presence in wound vac with no plan for surgery at this time 8/16TPN advanced to goal rate of 115 ml/hr with 20% ILE @ 20 mL/hr x12 hours on MWF 8/22: NGT clamped. RD to order Boost Breeze for patient to sip on; Mycostatin started for oral thrush. 9/1:pulled NGT and no plans for replacement. 9/1:developed N/V and only permitted sips of clears, if tolerated. 9/4:NGT re-insertion attempted several times without success 9/7:begin cycling TPN 9/23: allowed to leave the floor to go outside   10/2 Pt remains NPO with cyclic TPN regimen. Spoke with Pharmacist concerning prealbumin; adjusted estimated needs in light of this and continued plan for surgery on Friday (10/5) and wanting to maximizing nutrition prior to surgery. Pharmacist plans to make adjustments tomorrow for TPN (will likely maintain rate but increase from 14 hours to 16 hours/day). Will continue to monitor and communicate with Pharmacist. Weight continues to fluctuate; will monitor closely, especially with increases to TPN volume.  Medications reviewed; 20 mg IV Lasix every 48 hours, 4 g IV Mg sulfate x2 runs today, 10 mg IV Reglan TID, 40 mg IV Protonix BID.  Labs reviewed; K: 3.3 mmol/L, BUN: 21 mg/dL, Ca: 8.2 mg/dL, prealbumin: 13.4 mg/dL.    9/27 - Pt remains NPO with cyclic TPN as outlined below.  - Pt with some abdominal pain on Tuesday which was improved with wound vac change yesterday.  - Weight trending up since 9/24 and is currently +6 lbs/2.6 kg since that date.  - Plan remains for OR on 10/5 with Dr. MHassell Done   9/24 -  Pt remains NPO.  - He has a triple lumen PICC which was placed on 05/15/17.  - He is receiving cyclic TPN: Clinimix 2/06 x14 hours (2644 mL/day) with 20% ILE @ 20 mL/hr x12 hours on MWF.  - This regimen is providing a daily average of 132 grams of protein and 2083 kcal.  - Weight has  been stable since 9/5 (throughtout the time of receiving cyclic TPN).    9/18 - Pt remains NPO and continues cyclic TPN regimen. - TPN run over 14 hours/day. ILE on MWF.  - Per notes and per rounds this AM, plan for fistula-repair surgery on 10/5.  - Weight has been mainly stable for the past 2 weeks.    Diet Order:  Diet NPO time specified Except for: Ice Chips, Other (See Comments) TPN (CLINIMIX) Adult without lytes  Skin:  Wound (see comment) (Incisions to R flank and abdomen from 7/11 and 7/18)  Last BM:  10/1 (through EC fistula)  Height:   Ht Readings from Last 1 Encounters:  05/26/17 '5\' 10"'  (1.778 m)    Weight:   Wt Readings from Last 1 Encounters:  07/28/17 177 lb 11.1 oz (80.6 kg)    Ideal Body Weight:  75.45 kg  BMI:  Body mass index is 25.5 kg/m.  Estimated Nutritional Needs:   Kcal:  2030-2270 (25-28 kcal/kg)  Protein:  137-153 grams (1.7-1.9 grams/kg)  Fluid:  2 L/day  EDUCATION NEEDS:   No education needs identified at this time    Jarome Matin, MS, RD, LDN, CNSC Inpatient Clinical Dietitian Pager # [phone] After hours/weekend pager # [phone]

## 2017-07-28 NOTE — Progress Notes (Signed)
07/28/17  0823  Infection disease called and wants patient on staph precautions. Staph order set applied.

## 2017-07-28 NOTE — Progress Notes (Signed)
OT Cancellation Note  Patient Details Name: KATHRYN LINAREZ MRN: [id number] DOB: 03-16-1961   Cancelled Treatment:    Reason Eval/Treat Not Completed: Fatigue/lethargy limiting ability to participate.  Pt reports he is too fatigued due to activity with PT earlier.  Will reattempt.  [place], OTR/L [phone]   [name] 07/28/2017, 2:11 PM

## 2017-07-28 NOTE — Progress Notes (Signed)
[place] NOTE   Pharmacy Consult for TPN Indication: prolonged ileus, small bowel leak, multiple bowel surgeries  Patient Measurements: Body mass index is 25.5 kg/m. Filed Weights   07/26/17 0433 07/27/17 0356 07/28/17 0357  Weight: 181 lb (82.1 kg) 176 lb 2.4 oz (79.9 kg) 177 lb 11.1 oz (80.6 kg)   HPI: [name] [name] admitted on 7/11 for enlarging adrenal mass and planned adrenalectomy.  Pharmacy consulted to dose TPN.  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   7/22 OR:  wash out, tube ileostomy, left with open abdomen & wound vac in place.  Plan for OR on 7/25 for closure 7/25 OR: mesh placed for abdominal closure, wound vac 8/2 extubated, propofol drip off 8/4 TPN off for ~ 5 hours due to line detached from filter 8/10 MD request decrease rate x 1 week 2nd low sodium 8/12 Additional sodium added to TPN due to persistent hyponatremia 8/16 Increasing rate back to goal since sodium has improved with above intervention.   8/20 Prealbumin decreased.  New apparent enteric leak per surgery notes. 8/21 CaPhos product elevated, remove electrolytes from TPN 8/25 PICC did not have blood return in 2 of the 3 ports.  Alteplase intra-catheter and function returned.  TPN infusing correctly on 8/26 per RN. 8/28: d/c SSI 8/30: added electrolytes to TPN 8/31: TPN IV line came out around ~0340 and current bag was stopped at this time.  Restart new clinimix 5/15 1L bag (no lytes since phos is elevated this morning) until next bag is due at [facility]. Will not add MVI and trace elements to this 1L bag. Use  electrolyte free clinimix with new bag at [facility] 9/1 NGT out 9/3 added electrolytes to TPN 9/4 lytes out of TPN 9/7 cycle over 18 hrs 9/8 add back electrolytes to TPN.  Cycle over 14 hours 9/9 lytes out of TPN, continue 14 hr cycle, DC SSI/CBGs 9/13 electrolytes added back to TPN 9/14 remove electrolytes from TPN  Plan fistula-repair surgery on 10/5  Insulin requirements past 24 hours: none (no hx DM)  Current Nutrition: NPO  IVF: none. Lasix 20 IV q48h.  Central access: PICC line ordered 7/19, placed on 7/20 TPN start date: 7/20  ASSESSMENT                                                                                                           Today, 07/28/17  - Glucose (goal <150): at goal -  Electrolytes:  K 3.3  (goal > 4 for ileus), low and decreased, requiring aggressive daily replacement IV Mag 2.2 (goal > 2 for ileus), improved to goal after daily aggressive replacement (extended infusion Mag sulfate on 9/28, 12 g/day on 9/29, 9/30, and 10/1)  Na 141 - normalized and stable with additional sodium in TPN. Phos 4, no electrolytes in TPN, CorrCa 9.72 , Ca/phos product 39 (goal < 55)   - Renal:  SCr wnl - LFTs: WNL except Alk Phos which is elevated but improved - TGs:   206 (9/3), 227 (9/10), 195 (9/16), 193 (9/24), 111(10/1) - Prealbumin:  <5 (7/20), 6.1 (7/24), 10.1 (7/30) 24.8 (8/6), 26.4 (8/13), 15.9 (8/20), 19.7 (8/27) 24.7 (9/3) 23.7 (9/10) 18.5 (9/17), 11.5 (9/24), 13.4 (10/1)  NUTRITIONAL GOALS                                                                                             RD recs (9/27):  122-138 gms Protein (1.5 - 1.7 g/kg) 2030 - 2270 Kcal  Recalculation: Clinimix 5/15 2644 mL over 14h cycle with lipids 3x/week on MWF only would provide: 132g protein and avg 2080 kcal/day  Glucose infusion rate will be 6.6 mg/kg/min at max cyclic rate.  (Maximum 7 mg/kg/min)   PLAN                                                                                                                          Now: - KCl 10 meq IV x6 runs - Mag sulfate 4gm IV x2 doses  At 1800 today:  Clinimix 5/15 (electrolyte-free formulation) for total of 2644 mL/day to cycle over 14hrs  Cycle: 50 ml/hr x 1 hr 6p-7p, 212 ml/hr x 12 hrs 7p-7a, 50 ml/hr x 1 hr 7a-8a, then off  Add sodium 125 mEq/L to TPN: add as 70 mEq/L NaCl and 55 mEq/L Na Acetate   Lipids at 66ms/hr x 12 hours 3 times weekly on MWF  Standard MVI, Trace elements, and pepcid 40 mg/day in TPN  TPN lab panels on Mondays & Thursdays.  BMET, Mag & Phos daily ordered.  CGretta ArabPharmD, BCPS Pager [phone]/11/2016 6:30 AM

## 2017-07-28 NOTE — Progress Notes (Signed)
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 Kaylyn Lim and Dr. Annye English  FEN: NPO, ice chips, TNA VTE: SCD's, lovenox ID: cultures/abx none currently Follow up: TBD  DISPO: OR Friday with Dr. Hassell Done and Dr. Dema Severin    LOS: 83 days    Subjective:  CC: Right sided abdominal pain at vac site  Pt states vac is leaking. No other new complaints.   Objective: Vital signs in last 24 hours: Temp:  [97.9 F (36.6 C)-98.5 F (36.9 C)] 98.3 F (36.8 C) (10/02 0800) Pulse Rate:  [83-96] 93 (10/02 0600) Resp:  [14-22] 20 (10/02 0800) BP: (89-124)/(56-79) 124/71 (10/02 0600) SpO2:  [97 %-99 %] 98 % (10/02 0800) Weight:  [177 lb 11.1 oz (80.6 kg)] 177 lb 11.1 oz (80.6 kg) (10/02 0357) Last BM Date: 07/27/17 (through a FC )  Intake/Output from previous day: 10/01 0701 - 10/02 0700 In: 2931.2 [I.V.:2631.2; IV Piggyback:300] Out: 940 [Urine:600; Drains:340] Intake/Output this shift: No intake/output data recorded.  PE: Gen:  Alert, NAD, pleasant, cooperative HEENT: sclerae clear, pupils are equal, moist mucus membranes Card:  RRR Pulm:  CTA, no W/R/R, rate and effort normal Abd: Soft, not distended, vac intact with leak on lateral side, drains with green output and slight odor Skin: no rashes noted, warm and dry Neuro: alert and oriented   Anti-infectives: Anti-infectives    Start     Dose/Rate Route Frequency Ordered Stop   07/08/17 1600  fluconazole (DIFLUCAN) IVPB 100 mg     100 mg 50 mL/hr over 60 Minutes Intravenous Every 24 hours 07/08/17 0828 07/14/17 1710   07/01/17 1500  fluconazole (DIFLUCAN) IVPB 100 mg     100 mg 50 mL/hr over 60 Minutes Intravenous Every 24 hours 07/01/17 1401 07/07/17 1715   06/16/17  1200  piperacillin-tazobactam (ZOSYN) IVPB 3.375 g     3.375 g 12.5 mL/hr over 240 Minutes Intravenous Every 8 hours 06/16/17 1026 06/17/17 0004   06/08/17 1200  piperacillin-tazobactam (ZOSYN) IVPB 3.375 g  Status:  Discontinued     3.375 g 12.5 mL/hr over 240 Minutes Intravenous Every 8 hours 06/08/17 1056 06/16/17 1026   05/29/17 2200  ceFEPIme (MAXIPIME) 2 g in dextrose 5 % 50 mL IVPB  Status:  Discontinued     2 g 100 mL/hr over 30 Minutes Intravenous Every 8 hours 05/29/17 2031 06/03/17 0832   05/29/17 2100  metroNIDAZOLE (FLAGYL) IVPB 500 mg  Status:  Discontinued     500 mg 100 mL/hr over 60 Minutes Intravenous Every 8 hours 05/29/17 2029 06/03/17 0832   05/26/17 2200  ceFAZolin (ANCEF) IVPB 1 g/50 mL premix  Status:  Discontinued     1 g 100 mL/hr over 30 Minutes Intravenous Every 8 hours 05/26/17 1008 05/29/17 2023   05/25/17 1100  vancomycin (VANCOCIN) 1,250 mg in sodium chloride 0.9 % 250 mL IVPB  Status:  Discontinued     1,250 mg 166.7 mL/hr over 90 Minutes Intravenous Every 12 hours 05/25/17 0955 05/26/17 0946   05/22/17 1400  anidulafungin (ERAXIS) 100 mg  in sodium chloride 0.9 % 100 mL IVPB     100 mg 78 mL/hr over 100 Minutes Intravenous Every 24 hours 05/21/17 1330 05/27/17 1740   05/21/17 1400  anidulafungin (ERAXIS) 200 mg in sodium chloride 0.9 % 200 mL IVPB     200 mg 78 mL/hr over 200 Minutes Intravenous  Once 05/21/17 1330 05/21/17 1940   05/15/17 1000  anidulafungin (ERAXIS) 100 mg in sodium chloride 0.9 % 100 mL IVPB  Status:  Discontinued     100 mg 78 mL/hr over 100 Minutes Intravenous Every 24 hours 05/14/17 0829 05/16/17 0901   05/15/17 1000  metroNIDAZOLE (FLAGYL) IVPB 500 mg  Status:  Discontinued     500 mg 100 mL/hr over 60 Minutes Intravenous Every 8 hours 05/15/17 0903 05/25/17 0938   05/14/17 0900  anidulafungin (ERAXIS) 200 mg in sodium chloride 0.9 % 200 mL IVPB     200 mg 78 mL/hr over 200 Minutes Intravenous  Once 05/14/17 0829 05/14/17 1325    05/13/17 1927  vancomycin (VANCOCIN) 1-5 GM/200ML-% IVPB    Comments:  Ward, Christa   : cabinet override      05/13/17 1927 05/14/17 0744   05/12/17 1400  ceFEPIme (MAXIPIME) 1 g in dextrose 5 % 50 mL IVPB     1 g 100 mL/hr over 30 Minutes Intravenous Every 8 hours 05/12/17 0939 05/26/17 2359   05/12/17 0900  vancomycin (VANCOCIN) IVPB 1000 mg/200 mL premix  Status:  Discontinued     1,000 mg 200 mL/hr over 60 Minutes Intravenous Every 12 hours 05/12/17 0750 05/16/17 0852   05/12/17 0830  aztreonam (AZACTAM) 2 GM IVPB     2 g 100 mL/hr over 30 Minutes Intravenous  Once 05/12/17 0800 05/12/17 0957   05/06/17 0916  vancomycin (VANCOCIN) IVPB 1000 mg/200 mL premix     1,000 mg 200 mL/hr over 60 Minutes Intravenous On call to O.R. 05/06/17 0916 05/06/17 1229      Lab Results:   Recent Labs  07/27/17 1128  WBC 9.6  HGB 8.6*  HCT 26.9*  PLT 547*   BMET  Recent Labs  07/27/17 1128 07/28/17 0349  NA 140 141  K 3.8 3.3*  CL 105 108  CO2 27 27  GLUCOSE 109* 112*  BUN 24* 21*  CREATININE 0.78 0.70  CALCIUM 9.0 8.2*   PT/INR No results for input(s): LABPROT, INR in the last 72 hours. CMP     Component Value Date/Time   NA 141 07/28/2017 0349   K 3.3 (L) 07/28/2017 0349   CL 108 07/28/2017 0349   CO2 27 07/28/2017 0349   GLUCOSE 112 (H) 07/28/2017 0349   BUN 21 (H) 07/28/2017 0349   CREATININE 0.70 07/28/2017 0349   CREATININE 0.85 07/22/2013 1021   CALCIUM 8.2 (L) 07/28/2017 0349   PROT 5.8 (L) 07/28/2017 0349   ALBUMIN 2.1 (L) 07/28/2017 0349   AST 22 07/28/2017 0349   ALT 18 07/28/2017 0349   ALKPHOS 133 (H) 07/28/2017 0349   BILITOT 0.4 07/28/2017 0349   GFRNONAA >60 07/28/2017 0349   GFRAA >60 07/28/2017 0349   Lipase     Component Value Date/Time   LIPASE 27 09/15/2015 0934    Studies/Results: No results found.    Kalman Drape , [facility] 07/28/2017, 8:33 AM Pager: [phone] Consults: [phone] Mon-Fri 7:00  am-4:30 pm Sat-Sun 7:00 am-11:30 am

## 2017-07-28 NOTE — Consult Note (Signed)
[place] Nurse wound consult note Reason for Consult: NPWT dressing change.  Supplies provided to room in anticipation of dressing change, but Acupuncturist ([place]) were able to successfully change dressing prior to my arrival. Patient is satisfied that it was performed accurately, medical adhesive remover used to remove dressing, three pieces of foam removed (2 black, 1 white), periwound skin protected with drape, white foam placed in wound bed, black foam used to cover. Seal achieved on first attempt and remains with excellent seal. While not bridged, patient reports dressing is comfortable. Cannister also changed.  One piece of white foam and 1 piece of black foam used in dressing change today. Wound type: EC fistula Dressing change is due on Thursday, October 4 and will be performed by a member of the [facility] Nursing team. Surgery day is Friday, October 5. [place] nursing team will follow, and will remain available to this patient, the nursing, surgical and medical teams.   Thanks, Maudie Flakes, MSN, RN, [place], Arther Abbott  Pager# [phone]

## 2017-07-28 NOTE — Progress Notes (Signed)
Physical Therapy Treatment Patient Details Name: [name] [name] MRN: [id number] DOB: 1961-05-30 Today's Date: 07/28/2017    History of Present Illness  56 y.o. 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 On 7/18, he developed a leak therefore was taken back to OR for re-do of ex-lap and repair of leak on 05/20/17.  He returned to the ICU on the vent . Extubated on 05/28/17.     PT Comments    Assisted OOB to amb in hallway while monitoring vitals and pain level.    Follow Up Recommendations  SNF;Supervision/Assistance - 24 hour     Equipment Recommendations  Rolling walker with 5" wheels    Recommendations for Other Services       Precautions / Restrictions Precautions Precautions: Fall Precaution Comments: multiple lines in abdomen, VAC, 2 ABD drains , and JP drain Restrictions Weight Bearing Restrictions: No    Mobility  Bed Mobility Overal bed mobility: Needs Assistance Bed Mobility: Rolling;Sidelying to Sit Rolling: Supervision   Supine to sit: Supervision;Min guard     General bed mobility comments: increased time  Transfers Overall transfer level: Needs assistance   Transfers: Sit to/from Stand;Stand Pivot Transfers Sit to Stand: Supervision Stand pivot transfers: Min guard       General transfer comment: increased time with caution to multiple lines/tubes   Ambulation/Gait Ambulation/Gait assistance: Supervision;Min guard Ambulation Distance (Feet): 1550 Feet (3 laps) Assistive device: Rolling walker (2 wheeled)   Gait [facility]: [facility]   General Gait Details: 3 lap around unit while monitoring vitals   Amb pain increased from 2 to 7.  Pain meds requested.     Stairs            Wheelchair Mobility    Modified Rankin (Stroke Patients Only)       Balance                                             Cognition Arousal/Alertness: Awake/alert Behavior During Therapy: WFL for tasks assessed/performed Overall Cognitive Status: Within Functional Limits for tasks assessed                                 General Comments: pleasant/cooperative/motivated      Exercises      General Comments        Pertinent Vitals/Pain Pain Assessment: 0-10 Pain Score: 7  Pain Location: ABD VAC location Pain Descriptors / Indicators: Grimacing;Operative site guarding Pain Intervention(s): Monitored during session;Patient requesting pain meds-RN notified;Repositioned    Home Living                      Prior Function            PT Goals (current goals can now be found in the care plan section) Progress towards PT goals: Progressing toward goals    Frequency    Min 3X/week      PT Plan Current plan remains appropriate    Co-evaluation              AM-PAC PT "6 Clicks" Daily Activity  Outcome Measure  Difficulty turning over in bed (including adjusting bedclothes, sheets  and blankets)?: A Little Difficulty moving from lying on back to sitting on the side of the bed? : A Little Difficulty sitting down on and standing up from a chair with arms (e.g., wheelchair, bedside commode, etc,.)?: A Little Help needed moving to and from a bed to chair (including a wheelchair)?: A Little Help needed walking in hospital room?: A Little Help needed climbing 3-5 steps with a railing? : A Little 6 Click Score: 18    End of Session Equipment Utilized During Treatment: Gait belt Activity Tolerance: Patient tolerated treatment well Patient left: in chair;with call bell/phone within reach Nurse Communication: Mobility status PT Visit Diagnosis: Difficulty in walking, not elsewhere classified (R26.2);Muscle weakness (generalized) (M62.81)     Time: 1950-9326 PT Time Calculation (min) (ACUTE ONLY): 27  min  Charges:  $Gait Training: 8-22 mins $Therapeutic Activity: 8-22 mins                    G Codes:       {Claudeen Leason  PTA [facility]  Acute  Rehab Pager      [phone]

## 2017-07-29 LAB — COMPREHENSIVE METABOLIC PANEL
ALBUMIN: 2.3 g/dL — AB (ref 3.5–5.0)
ALT: 18 U/L (ref 17–63)
ANION GAP: 8 (ref 5–15)
AST: 19 U/L (ref 15–41)
Alkaline Phosphatase: 132 U/L — ABNORMAL HIGH (ref 38–126)
BUN: 24 mg/dL — ABNORMAL HIGH (ref 6–20)
CALCIUM: 8.9 mg/dL (ref 8.9–10.3)
CHLORIDE: 106 mmol/L (ref 101–111)
CO2: 26 mmol/L (ref 22–32)
Creatinine, Ser: 0.69 mg/dL (ref 0.61–1.24)
GFR calc Af Amer: >60 mL/min (ref >60)
GFR calc non Af Amer: >60 mL/min (ref >60)
GLUCOSE: 110 mg/dL — AB (ref 65–99)
Potassium: 4.1 mmol/L (ref 3.5–5.1)
SODIUM: 140 mmol/L (ref 135–145)
Total Bilirubin: 0.5 mg/dL (ref 0.3–1.2)
Total Protein: 6.3 g/dL — ABNORMAL LOW (ref 6.5–8.1)

## 2017-07-29 LAB — MAGNESIUM: Magnesium: 1.4 mg/dL — ABNORMAL LOW (ref 1.7–2.4)

## 2017-07-29 LAB — PHOSPHORUS: Phosphorus: 4.8 mg/dL — ABNORMAL HIGH (ref 2.5–4.6)

## 2017-07-29 MED ORDER — POTASSIUM CHLORIDE 10 MEQ/50ML IV SOLN
10.0000 meq | INTRAVENOUS | Status: AC
  Administered 2017-07-29 (×4): 10 meq via INTRAVENOUS
  Filled 2017-07-29 (×4): qty 50

## 2017-07-29 MED ORDER — TRACE MINERALS CR-CU-MN-SE-ZN 10-1000-500-60 MCG/ML IV SOLN
INTRAVENOUS | Status: AC
  Administered 2017-07-29: 18:00:00 via INTRAVENOUS
  Filled 2017-07-29: qty 3000
  Filled 2017-07-29: qty 1000

## 2017-07-29 MED ORDER — FAT EMULSION 20 % IV EMUL
240.0000 mL | INTRAVENOUS | Status: AC
  Administered 2017-07-29: 240 mL via INTRAVENOUS
  Filled 2017-07-29: qty 240

## 2017-07-29 MED ORDER — NALOXONE HCL 0.4 MG/ML IJ SOLN: 0.4000 mg | INTRAMUSCULAR | Status: DC | PRN

## 2017-07-29 MED ORDER — DIPHENHYDRAMINE HCL 50 MG/ML IJ SOLN
12.5000 mg | Freq: Four times a day (QID) | INTRAMUSCULAR | Status: DC | PRN
Start: 2017-07-29 — End: 2017-07-30

## 2017-07-29 MED ORDER — MAGNESIUM SULFATE 4 GM/100ML IV SOLN
4.0000 g | INTRAVENOUS | Status: AC
  Administered 2017-07-29 (×3): 4 g via INTRAVENOUS
  Filled 2017-07-29 (×3): qty 100

## 2017-07-29 MED ORDER — SODIUM CHLORIDE 0.9% FLUSH: 9.0000 mL | INTRAVENOUS | Status: DC | PRN

## 2017-07-29 MED ORDER — FENTANYL 40 MCG/ML IV SOLN
INTRAVENOUS | Status: DC
  Administered 2017-07-29: 1000 ug via INTRAVENOUS
  Administered 2017-07-29: 150 ug via INTRAVENOUS
  Administered 2017-07-29 (×2): 105 ug via INTRAVENOUS
  Administered 2017-07-30: 120 ug via INTRAVENOUS
  Administered 2017-07-30: 60 ug via INTRAVENOUS
  Filled 2017-07-29: qty 25

## 2017-07-29 MED ORDER — DIPHENHYDRAMINE HCL 12.5 MG/5ML PO ELIX: 12.5000 mg | ORAL_SOLUTION | Freq: Four times a day (QID) | ORAL | Status: DC | PRN

## 2017-07-29 MED ORDER — ONDANSETRON HCL 4 MG/2ML IJ SOLN: 4.0000 mg | Freq: Four times a day (QID) | INTRAMUSCULAR | Status: DC | PRN

## 2017-07-29 NOTE — Progress Notes (Signed)
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 Kaylyn Lim and Dr. Annye English  FEN: NPO, ice chips, TNA VTE: SCD's, lovenox ID: cultures/abx none currently Follow up: TBD  DISPO: OR Friday with Dr. Hassell Done and Dr. Dema Severin. switched to fentanyl pca.    LOS: 84 days    Subjective:  CC: right sided abdominal pain at site of vac  Doesn't like the dilaudid cause it leaves a foul taste in his mouth. States fentanyl works better and would prefer that.   Objective: Vital signs in last 24 hours: Temp:  [98.2 F (36.8 C)-98.9 F (37.2 C)] 98.2 F (36.8 C) (10/03 0400) Pulse Rate:  [86-127] 100 (10/03 0400) Resp:  [14-32] 16 (10/03 0400) BP: (81-125)/(39-81) 81/39 (10/03 0400) SpO2:  [96 %-98 %] 98 % (10/03 0400) FiO2 (%):  [98 %-99 %] 98 % (10/03 0400) Weight:  [175 lb 11.3 oz (79.7 kg)] 175 lb 11.3 oz (79.7 kg) (10/03 0451) Last BM Date: 07/27/17 (through a FC )  Intake/Output from previous day: 10/02 0701 - 10/03 0700 In: 2264.2 [I.V.:1914.2; IV Piggyback:350] Out: 2560 [Urine:1800; Drains:760] Intake/Output this shift: No intake/output data recorded.  PE: Gen:  Alert, NAD, pleasant, cooperative HEENT: sclerae clear, pupils are equal, moist mucus membranes Card:  RRR Pulm:  CTA, no W/R/R, rate and effort normal Abd: Soft, not distended, few BS appreciated, vac intact to right side of abdomen, drains with green output and slight odor Skin: no rashes noted, warm and dry Neuro: alert and oriented   Anti-infectives: Anti-infectives    Start     Dose/Rate Route Frequency Ordered Stop   07/08/17 1600  fluconazole (DIFLUCAN) IVPB 100 mg     100 mg 50 mL/hr over 60 Minutes Intravenous Every 24 hours 07/08/17 0828 07/14/17  1710   07/01/17 1500  fluconazole (DIFLUCAN) IVPB 100 mg     100 mg 50 mL/hr over 60 Minutes Intravenous Every 24 hours 07/01/17 1401 07/07/17 1715   06/16/17 1200  piperacillin-tazobactam (ZOSYN) IVPB 3.375 g     3.375 g 12.5 mL/hr over 240 Minutes Intravenous Every 8 hours 06/16/17 1026 06/17/17 0004   06/08/17 1200  piperacillin-tazobactam (ZOSYN) IVPB 3.375 g  Status:  Discontinued     3.375 g 12.5 mL/hr over 240 Minutes Intravenous Every 8 hours 06/08/17 1056 06/16/17 1026   05/29/17 2200  ceFEPIme (MAXIPIME) 2 g in dextrose 5 % 50 mL IVPB  Status:  Discontinued     2 g 100 mL/hr over 30 Minutes Intravenous Every 8 hours 05/29/17 2031 06/03/17 0832   05/29/17 2100  metroNIDAZOLE (FLAGYL) IVPB 500 mg  Status:  Discontinued     500 mg 100 mL/hr over 60 Minutes Intravenous Every 8 hours 05/29/17 2029 06/03/17 0832   05/26/17 2200  ceFAZolin (ANCEF) IVPB 1 g/50 mL premix  Status:  Discontinued     1 g 100 mL/hr over 30 Minutes Intravenous Every 8 hours 05/26/17 1008 05/29/17 2023   05/25/17 1100  vancomycin (VANCOCIN) 1,250 mg in sodium chloride 0.9 % 250 mL IVPB  Status:  Discontinued     1,250 mg 166.7 mL/hr over 90 Minutes Intravenous Every 12 hours 05/25/17 0955 05/26/17 0946   05/22/17 1400  anidulafungin (ERAXIS) 100 mg in sodium chloride 0.9 % 100 mL IVPB     100 mg 78 mL/hr over 100  Minutes Intravenous Every 24 hours 05/21/17 1330 05/27/17 1740   05/21/17 1400  anidulafungin (ERAXIS) 200 mg in sodium chloride 0.9 % 200 mL IVPB     200 mg 78 mL/hr over 200 Minutes Intravenous  Once 05/21/17 1330 05/21/17 1940   05/15/17 1000  anidulafungin (ERAXIS) 100 mg in sodium chloride 0.9 % 100 mL IVPB  Status:  Discontinued     100 mg 78 mL/hr over 100 Minutes Intravenous Every 24 hours 05/14/17 0829 05/16/17 0901   05/15/17 1000  metroNIDAZOLE (FLAGYL) IVPB 500 mg  Status:  Discontinued     500 mg 100 mL/hr over 60 Minutes Intravenous Every 8 hours 05/15/17 0903 05/25/17 0938    05/14/17 0900  anidulafungin (ERAXIS) 200 mg in sodium chloride 0.9 % 200 mL IVPB     200 mg 78 mL/hr over 200 Minutes Intravenous  Once 05/14/17 0829 05/14/17 1325   05/13/17 1927  vancomycin (VANCOCIN) 1-5 GM/200ML-% IVPB    Comments:  Ward, Christa   : cabinet override      05/13/17 1927 05/14/17 0744   05/12/17 1400  ceFEPIme (MAXIPIME) 1 g in dextrose 5 % 50 mL IVPB     1 g 100 mL/hr over 30 Minutes Intravenous Every 8 hours 05/12/17 0939 05/26/17 2359   05/12/17 0900  vancomycin (VANCOCIN) IVPB 1000 mg/200 mL premix  Status:  Discontinued     1,000 mg 200 mL/hr over 60 Minutes Intravenous Every 12 hours 05/12/17 0750 05/16/17 0852   05/12/17 0830  aztreonam (AZACTAM) 2 GM IVPB     2 g 100 mL/hr over 30 Minutes Intravenous  Once 05/12/17 0800 05/12/17 0957   05/06/17 0916  vancomycin (VANCOCIN) IVPB 1000 mg/200 mL premix     1,000 mg 200 mL/hr over 60 Minutes Intravenous On call to O.R. 05/06/17 0916 05/06/17 1229      Lab Results:   Recent Labs  07/27/17 1128  WBC 9.6  HGB 8.6*  HCT 26.9*  PLT 547*   BMET  Recent Labs  07/27/17 1128 07/28/17 0349  NA 140 141  K 3.8 3.3*  CL 105 108  CO2 27 27  GLUCOSE 109* 112*  BUN 24* 21*  CREATININE 0.78 0.70  CALCIUM 9.0 8.2*   PT/INR No results for input(s): LABPROT, INR in the last 72 hours. CMP     Component Value Date/Time   NA 141 07/28/2017 0349   K 3.3 (L) 07/28/2017 0349   CL 108 07/28/2017 0349   CO2 27 07/28/2017 0349   GLUCOSE 112 (H) 07/28/2017 0349   BUN 21 (H) 07/28/2017 0349   CREATININE 0.70 07/28/2017 0349   CREATININE 0.85 07/22/2013 1021   CALCIUM 8.2 (L) 07/28/2017 0349   PROT 5.8 (L) 07/28/2017 0349   ALBUMIN 2.1 (L) 07/28/2017 0349   AST 22 07/28/2017 0349   ALT 18 07/28/2017 0349   ALKPHOS 133 (H) 07/28/2017 0349   BILITOT 0.4 07/28/2017 0349   GFRNONAA >60 07/28/2017 0349   GFRAA >60 07/28/2017 0349   Lipase     Component Value Date/Time   LIPASE 27 09/15/2015 0934     Studies/Results: No results found.    Kalman Drape , [facility] Surgery 07/29/2017, 8:03 AM Pager: [phone] Consults: [phone] Mon-Fri 7:00 am-4:30 pm Sat-Sun 7:00 am-11:30 am

## 2017-07-29 NOTE — Progress Notes (Signed)
OT Cancellation Note  Patient Details Name: [name] [name] MRN: [id number] DOB: [birth year]   Cancelled Treatment:    Reason Eval/Treat Not Completed: Other (comment). Pt sleeping soundly. Worked with PT earlier. Will check back tomorrow as schedule permits.  Caylin Raby 07/29/2017, 3:45 PM  [name], OTR/L [phone] 07/29/2017

## 2017-07-29 NOTE — Progress Notes (Signed)
Patient ID: [name] [name], male   DOB: June 28, 1961, 56 y.o.   MRN: [id number] [facility] Progress Note:   70 Days Post-Op  Subjective: Mental status is less anxious this morning Objective: Vital signs in last 24 hours: Temp:  [97.8 F (36.6 C)-98.6 F (37 C)] 97.8 F (36.6 C) (10/03 0800) Pulse Rate:  [90-100] 100 (10/03 0400) Resp:  [14-20] 20 (10/03 1250) BP: (81-125)/(39-81) 81/39 (10/03 0400) SpO2:  [95 %-99 %] 96 % (10/03 1250) FiO2 (%):  [98 %-99 %] 98 % (10/03 0400) Weight:  [79.7 kg (175 lb 11.3 oz)] 79.7 kg (175 lb 11.3 oz) (10/03 0451)  Intake/Output from previous day: 10/02 0701 - 10/03 0700 In: 2264.2 [I.V.:1914.2; IV Piggyback:350] Out: 2560 [Urine:1800; Drains:760] Intake/Output this shift: Total I/O In: -  Out: 855 [Urine:200; Drains:655]  Physical Exam: Work of breathing is normal.  Incisions and drains unchanged  Lab Results:  Results for orders placed or performed during the hospital encounter of 05/06/17 (from the past 48 hour(s))  Magnesium     Status: None   Collection Time: 07/28/17  3:49 AM  Result Value Ref Range   Magnesium 2.2 1.7 - 2.4 mg/dL  Phosphorus     Status: None   Collection Time: 07/28/17  3:49 AM  Result Value Ref Range   Phosphorus 4.0 2.5 - 4.6 mg/dL  Comprehensive metabolic panel     Status: Abnormal   Collection Time: 07/28/17  3:49 AM  Result Value Ref Range   Sodium 141 135 - 145 mmol/L   Potassium 3.3 (L) 3.5 - 5.1 mmol/L   Chloride 108 101 - 111 mmol/L   CO2 27 22 - 32 mmol/L   Glucose, Bld 112 (H) 65 - 99 mg/dL   BUN 21 (H) 6 - 20 mg/dL   Creatinine, Ser 0.70 0.61 - 1.24 mg/dL   Calcium 8.2 (L) 8.9 - 10.3 mg/dL   Total Protein 5.8 (L) 6.5 - 8.1 g/dL   Albumin 2.1 (L) 3.5 - 5.0 g/dL   AST 22 15 - 41 U/L   ALT 18 17 - 63 U/L   Alkaline Phosphatase 133 (H) 38 - 126 U/L   Total Bilirubin 0.4 0.3 - 1.2 mg/dL   GFR calc non Af Amer >60 >60 mL/min   GFR calc Af Amer >60 >60 mL/min    Comment: (NOTE) The eGFR  has been calculated using the CKD EPI equation. This calculation has not been validated in all clinical situations. eGFR's persistently <60 mL/min signify possible Chronic Kidney Disease.    Anion gap 6 5 - 15  Comprehensive metabolic panel     Status: Abnormal   Collection Time: 07/29/17  8:12 AM  Result Value Ref Range   Sodium 140 135 - 145 mmol/L   Potassium 4.1 3.5 - 5.1 mmol/L    Comment: DELTA CHECK NOTED REPEATED TO VERIFY NO VISIBLE HEMOLYSIS    Chloride 106 101 - 111 mmol/L   CO2 26 22 - 32 mmol/L   Glucose, Bld 110 (H) 65 - 99 mg/dL   BUN 24 (H) 6 - 20 mg/dL   Creatinine, Ser 0.69 0.61 - 1.24 mg/dL   Calcium 8.9 8.9 - 10.3 mg/dL   Total Protein 6.3 (L) 6.5 - 8.1 g/dL   Albumin 2.3 (L) 3.5 - 5.0 g/dL   AST 19 15 - 41 U/L   ALT 18 17 - 63 U/L   Alkaline Phosphatase 132 (H) 38 - 126 U/L   Total Bilirubin 0.5 0.3 -  1.2 mg/dL   GFR calc non Af Amer >60 >60 mL/min   GFR calc Af Amer >60 >60 mL/min    Comment: (NOTE) The eGFR has been calculated using the CKD EPI equation. This calculation has not been validated in all clinical situations. eGFR's persistently <60 mL/min signify possible Chronic Kidney Disease.    Anion gap 8 5 - 15  Magnesium     Status: Abnormal   Collection Time: 07/29/17  8:12 AM  Result Value Ref Range   Magnesium 1.4 (L) 1.7 - 2.4 mg/dL  Phosphorus     Status: Abnormal   Collection Time: 07/29/17  8:12 AM  Result Value Ref Range   Phosphorus 4.8 (H) 2.5 - 4.6 mg/dL    Radiology/Results: No results found.  Anti-infectives: Anti-infectives    Start     Dose/Rate Route Frequency Ordered Stop   07/08/17 1600  fluconazole (DIFLUCAN) IVPB 100 mg     100 mg 50 mL/hr over 60 Minutes Intravenous Every 24 hours 07/08/17 0828 07/14/17 1710   07/01/17 1500  fluconazole (DIFLUCAN) IVPB 100 mg     100 mg 50 mL/hr over 60 Minutes Intravenous Every 24 hours 07/01/17 1401 07/07/17 1715   06/16/17 1200  piperacillin-tazobactam (ZOSYN) IVPB 3.375 g      3.375 g 12.5 mL/hr over 240 Minutes Intravenous Every 8 hours 06/16/17 1026 06/17/17 0004   06/08/17 1200  piperacillin-tazobactam (ZOSYN) IVPB 3.375 g  Status:  Discontinued     3.375 g 12.5 mL/hr over 240 Minutes Intravenous Every 8 hours 06/08/17 1056 06/16/17 1026   05/29/17 2200  ceFEPIme (MAXIPIME) 2 g in dextrose 5 % 50 mL IVPB  Status:  Discontinued     2 g 100 mL/hr over 30 Minutes Intravenous Every 8 hours 05/29/17 2031 06/03/17 0832   05/29/17 2100  metroNIDAZOLE (FLAGYL) IVPB 500 mg  Status:  Discontinued     500 mg 100 mL/hr over 60 Minutes Intravenous Every 8 hours 05/29/17 2029 06/03/17 0832   05/26/17 2200  ceFAZolin (ANCEF) IVPB 1 g/50 mL premix  Status:  Discontinued     1 g 100 mL/hr over 30 Minutes Intravenous Every 8 hours 05/26/17 1008 05/29/17 2023   05/25/17 1100  vancomycin (VANCOCIN) 1,250 mg in sodium chloride 0.9 % 250 mL IVPB  Status:  Discontinued     1,250 mg 166.7 mL/hr over 90 Minutes Intravenous Every 12 hours 05/25/17 0955 05/26/17 0946   05/22/17 1400  anidulafungin (ERAXIS) 100 mg in sodium chloride 0.9 % 100 mL IVPB     100 mg 78 mL/hr over 100 Minutes Intravenous Every 24 hours 05/21/17 1330 05/27/17 1740   05/21/17 1400  anidulafungin (ERAXIS) 200 mg in sodium chloride 0.9 % 200 mL IVPB     200 mg 78 mL/hr over 200 Minutes Intravenous  Once 05/21/17 1330 05/21/17 1940   05/15/17 1000  anidulafungin (ERAXIS) 100 mg in sodium chloride 0.9 % 100 mL IVPB  Status:  Discontinued     100 mg 78 mL/hr over 100 Minutes Intravenous Every 24 hours 05/14/17 0829 05/16/17 0901   05/15/17 1000  metroNIDAZOLE (FLAGYL) IVPB 500 mg  Status:  Discontinued     500 mg 100 mL/hr over 60 Minutes Intravenous Every 8 hours 05/15/17 0903 05/25/17 0938   05/14/17 0900  anidulafungin (ERAXIS) 200 mg in sodium chloride 0.9 % 200 mL IVPB     200 mg 78 mL/hr over 200 Minutes Intravenous  Once 05/14/17 0829 05/14/17 1325   05/13/17 1927  vancomycin (  VANCOCIN) 1-5 GM/200ML-%  IVPB    Comments:  Ward, Christa   : cabinet override      05/13/17 1927 05/14/17 0744   05/12/17 1400  ceFEPIme (MAXIPIME) 1 g in dextrose 5 % 50 mL IVPB     1 g 100 mL/hr over 30 Minutes Intravenous Every 8 hours 05/12/17 0939 05/26/17 2359   05/12/17 0900  vancomycin (VANCOCIN) IVPB 1000 mg/200 mL premix  Status:  Discontinued     1,000 mg 200 mL/hr over 60 Minutes Intravenous Every 12 hours 05/12/17 0750 05/16/17 0852   05/12/17 0830  aztreonam (AZACTAM) 2 GM IVPB     2 g 100 mL/hr over 30 Minutes Intravenous  Once 05/12/17 0800 05/12/17 0957   05/06/17 0916  vancomycin (VANCOCIN) IVPB 1000 mg/200 mL premix     1,000 mg 200 mL/hr over 60 Minutes Intravenous On call to O.R. 05/06/17 0916 05/06/17 1229      Assessment/Plan: Problem List: Patient Active Problem List   Diagnosis Date Noted  . Hypomagnesemia 06/28/2017  . Hypothyroidism   . 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 Protein-calorie malnutrition, severe ([place]) 06/27/2017  . 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 Pressure injury of skin 05/29/2017  . Ileus ([place])   . Hypokalemia 05/14/2017  . Anastomotic leak of intestine 05/14/2017  . Wound dehiscence, surgical 05/14/2017  . Aspiration pneumonia ([place]) 05/14/2017  . Chronic narcotic use 05/10/2017  . Generalized anxiety disorder 05/10/2017  . Intra-abdominal adhesions s/p SB resection 05/06/2017 05/09/2017  . Throat symptom 03/18/2016  . Multinodular goiter 01/19/2016  . Hyperthyroidism 01/19/2016  . Essential hypertension   . Diarrhea 11/30/2014  . Anemia, iron deficiency 11/01/2014  . Leukocytosis 11/03/2013  . Tobacco abuse 07/26/2013  . COPD (chronic obstructive pulmonary disease) ([place]) 07/26/2013  . Left knee pain 07/26/2013  . Pain in joint, shoulder region 05/03/2013  . GERD 07/24/2008  . 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    Will get anesthesia consult preop 70 Days Post-Op    LOS: 84 days    Matt B. Hassell Done, MD, [facility] [phone] beeper [phone]  07/29/2017 2:14 PM

## 2017-07-29 NOTE — Progress Notes (Signed)
Dilaudid PCA 11mg /ml wasted in sink, waste witnessed by Earma Reading, RN

## 2017-07-29 NOTE — Anesthesia Preprocedure Evaluation (Addendum)
Anesthesia Evaluation  Patient identified by MRN, date of birth, ID band Patient awake    Reviewed: Allergy & Precautions, H&P , NPO status , Patient's Chart, lab work & pertinent test results  History of Anesthesia Complications Negative for: history of anesthetic complications  Airway Mallampati: II   Neck ROM: full    Dental  (+) Teeth Intact   Pulmonary COPD, Current Smoker,  Hx of prolonged intubation during hospitalization 2/2 sepsis and respiratory failure.   breath sounds clear to auscultation       Cardiovascular hypertension, Pt. on medications and Pt. on home beta blockers  Rhythm:Regular Rate:Normal  06/08/17 Echo: Normal LV systolic function; mild proximal septal thickening;  mild diastolic dysfunction.  Myoview in 2016: Nl EF. No ischemia.   Neuro/Psych  Headaches,    GI/Hepatic GERD  ,For ex-lap 2/2 anastomotic leak and bowel injury from original adrenal surgery. 70days in the hospital   Endo/Other  S/p right adrenalectomy 2/2 pheochromocytoma  Renal/GU      Musculoskeletal  (+) Arthritis ,   Abdominal   Peds  Hematology  (+) anemia ,   Anesthesia Other Findings   Reproductive/Obstetrics                            Lab Results  Component Value Date   WBC 9.6 07/27/2017   HGB 8.6 (L) 07/27/2017   HCT 26.9 (L) 07/27/2017   MCV 74.9 (L) 07/27/2017   PLT 547 (H) 07/27/2017   Lab Results  Component Value Date   CREATININE 0.69 07/29/2017   BUN 24 (H) 07/29/2017   NA 140 07/29/2017   K 4.1 07/29/2017   CL 106 07/29/2017   CO2 26 07/29/2017    Anesthesia Physical  Anesthesia Plan  ASA: III  Anesthesia Plan: General   Post-op Pain Management:    Induction: Intravenous  PONV Risk Score and Plan: 3 and Ondansetron, Dexamethasone, Propofol, Midazolam and Treatment may vary due to age or medical condition  Airway Management Planned: Oral ETT  Additional  Equipment:   Intra-op Plan:   Post-operative Plan: Extubation in OR  Informed Consent:   Plan Discussed with: Surgeon  Anesthesia Plan Comments: (Pt seen in consultation at the request of Dr. Hassell Done. Pt with complicated postop course from adrenalectomy requiring multiple surgeries and prolonged intubation due to anastamotic leak and sepsis. Hx of easy intubation grade II view with Mac 4 and previous intubations with glidescope. Previous ACDF. Type and screen expired so a new order was placed. Plan for GETA, type and screen. +/-aline tbd by anesthesiologist dos.)       Anesthesia Quick Evaluation

## 2017-07-29 NOTE — Progress Notes (Signed)
Physical Therapy Treatment Patient Details Name: [name] [name] MRN: [id number] DOB: [birth year] Today's Date: 07/29/2017    History of Present Illness  56 y.o. 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 On 7/18, he developed a leak therefore was taken back to OR for re-do of ex-lap and repair of leak on 05/20/17.  He returned to the ICU on the vent . Extubated on 05/28/17.     PT Comments    Assisted OOB to amb in hallway with one sitting rest break due to increased c/o pain and request for Ativan.  [place] having some anxiety about Friday's surgery.   Follow Up Recommendations  SNF;Supervision/Assistance - 24 hour     Equipment Recommendations  Rolling walker with 5" wheels    Recommendations for Other Services       Precautions / Restrictions Precautions Precautions: Fall Precaution Comments: multiple lines in abdomen, VAC, 2 ABD drains , and JP drain Restrictions Weight Bearing Restrictions: No    Mobility  Bed Mobility Overal bed mobility: Needs Assistance Bed Mobility: Rolling;Sidelying to Sit     Supine to sit: Supervision;Min guard     General bed mobility comments: increased time  Transfers Overall transfer level: Needs assistance Equipment used: Rolling walker (2 wheeled) Transfers: Sit to/from [facility] Sit to Stand: Supervision         General transfer comment: increased time with caution to multiple lines/tubes   Ambulation/Gait Ambulation/Gait assistance: Supervision;Min guard Ambulation Distance (Feet): 1250 Feet Assistive device: Rolling walker (2 wheeled) Gait Pattern/deviations: Step-through pattern;Decreased step length - right;Decreased step length - left;Shuffle;Trunk flexed;Narrow base of support Gait velocity: WFL   General Gait Details: 2  1/2 laps with one sitting rest break due to increased pain and mild anxiety.  Ativan given during session.   Stairs            Wheelchair Mobility    Modified Rankin (Stroke Patients Only)       Balance                                            Cognition Arousal/Alertness: Awake/alert Behavior During Therapy: WFL for tasks assessed/performed Overall Cognitive Status: Within Functional Limits for tasks assessed                                 General Comments: worried about his surgery Friday      Exercises      General Comments        Pertinent Vitals/Pain Pain Assessment: Faces Faces Pain Scale: Hurts little more Pain Location: ABD VAC location "feels like pulling" Pain Descriptors / Indicators: Grimacing;Operative site guarding Pain Intervention(s): Monitored during session;PCA encouraged;Repositioned    Home Living                      Prior Function            PT Goals (current goals can now be found in the care plan section) Progress towards PT goals: Progressing toward goals    Frequency    Min 3X/week      PT Plan Current plan remains appropriate    Co-evaluation  AM-PAC PT "6 Clicks" Daily Activity  Outcome Measure  Difficulty turning over in bed (including adjusting bedclothes, sheets and blankets)?: A Little Difficulty moving from lying on back to sitting on the side of the bed? : A Little Difficulty sitting down on and standing up from a chair with arms (e.g., wheelchair, bedside commode, etc,.)?: A Little Help needed moving to and from a bed to chair (including a wheelchair)?: A Little Help needed walking in hospital room?: A Little Help needed climbing 3-5 steps with a railing? : A Little 6 Click Score: 18    End of Session Equipment Utilized During Treatment: Gait belt Activity Tolerance: Patient tolerated treatment well Patient left: in chair;with call bell/phone  within reach Nurse Communication: Mobility status PT Visit Diagnosis: Difficulty in walking, not elsewhere classified (R26.2);Muscle weakness (generalized) (M62.81)     Time: 8832-5498 PT Time Calculation (min) (ACUTE ONLY): 31 min  Charges:  $Gait Training: 8-22 mins $Therapeutic Activity: 8-22 mins                    G Codes:        [name]  PTA [facility]  Acute  Rehab Pager      [phone]

## 2017-07-29 NOTE — Progress Notes (Signed)
PHARMACY - ADULT TOTAL PARENTERAL NUTRITION CONSULT NOTE   Pharmacy Consult for TPN Indication: prolonged ileus, small bowel leak, multiple bowel surgeries  Patient Measurements: Body mass index is 25.21 kg/m. Filed Weights   07/27/17 0356 07/28/17 0357 07/29/17 0451  Weight: 176 lb 2.4 oz (79.9 kg) 177 lb 11.1 oz (80.6 kg) 175 lb 11.3 oz (79.7 kg)   HPI: [name] [name] admitted on 7/11 for enlarging adrenal mass and planned adrenalectomy.  Pharmacy consulted to dose TPN.  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   7/22 OR:  wash out, tube ileostomy, left with open abdomen & wound vac in place.  Plan for OR on 7/25 for closure 7/25 OR: mesh placed for abdominal closure, wound vac 8/2 extubated, propofol drip off 8/4 TPN off for ~ 5 hours due to line detached from filter 8/10 MD request decrease rate x 1 week 2nd low sodium 8/12 Additional sodium added to TPN due to persistent hyponatremia 8/16 Increasing rate back to goal since sodium has improved with above intervention.   8/20 Prealbumin decreased.  New apparent enteric leak per surgery notes. 8/21 CaPhos product elevated, remove electrolytes from TPN 8/25 PICC did not have blood return in 2 of the 3 ports.  Alteplase intra-catheter and function returned.  TPN infusing correctly on 8/26 per RN. 8/28: d/c SSI 8/30: added electrolytes to TPN 8/31: TPN IV line came out around ~0340 and current bag was stopped at this time.  Restart new clinimix 5/15 1L bag (no lytes since phos is elevated this morning) until next bag is [facility] at [facility]. Will not add MVI and trace elements to this 1L  bag. Use electrolyte free clinimix with new bag at [facility] 9/1 NGT out 9/3 added electrolytes to TPN 9/4 lytes out of TPN 9/7 cycle over 18 hrs 9/8 add back electrolytes to TPN.  Cycle over 14 hours 9/9 lytes out of TPN, continue 14 hr cycle, DC SSI/CBGs 9/13 electrolytes added back to TPN 9/14 remove electrolytes from TPN 10/2 Adjusted estimated needs based on prealbumin (decreased on 9/24, low but slightly improved on 10/1) and attempt to maximize nutrition prior to surgery planned for 07/31/17.  Plan fistula-repair surgery on 10/5  Insulin requirements past 24 hours: none (no hx DM)  Current Nutrition: NPO  IVF: none. Lasix 20 IV q48h.  Central access: PICC line ordered 7/19, placed on 7/20 TPN start date: 7/20  ASSESSMENT                                                                                                           Today, 07/29/17  - Glucose from chemistry panel (if drawn with daily labs at 0500 will be during max cyclic rate) remains at goal <150 -  Electrolytes:  K 4.1  (goal > 4 for ileus), improved to goal after daily IV replacement.  Expect lower K levels after Lasix doses given q48h.  Mag 1.4 (goal > 2 for ileus), improved yesterday to goal after daily aggressive replacement 12g/day, but now decreased after reducing to 8g/day and Lasix on 10/2.   Na 140 - normalized and stable with additional sodium in TPN. Phos 4.8, elevated and increased despite no electrolytes in TPN, CorrCa 10.26 , Ca/phos product 49.3 (goal < 55)   - Renal:  SCr wnl - LFTs: WNL except Alk Phos which is elevated but improved - TGs:   206 (9/3), 227 (9/10), 195 (9/16), 193 (9/24), 111(10/1) - Prealbumin:  <5 (7/20), 6.1 (7/24), 10.1 (7/30) 24.8 (8/6), 26.4 (8/13), 15.9 (8/20), 19.7 (8/27) 24.7 (9/3) 23.7 (9/10) 18.5 (9/17), 11.5 (9/24), 13.4 (10/1)  NUTRITIONAL GOALS                                                                                             RD recs (10/2):  137-153 gms Protein  (1.7 - 1.9 g/kg) 2030 - 2270 Kcal  Recalculation (10/3): Clinimix 5/15 3000 mL over 16h cycle with lipids 3x/week on MWF only would provide: 150 g protein and avg 2335 kcal/day  Glucose infusion rate will be 6.5 mg/kg/min at max cyclic rate. (Maximum 7 mg/kg/min)   PLAN                                                                                                                         Now: - KCl 10 meq IV x4 runs  - Mag sulfate 4gm IV x3 doses   At 1800 today:  Increase to Clinimix 5/15 (electrolyte-free formulation) for total of 3000 mL/day to cycle over 16hrs  Cycle: 50 ml/hr x 1 hr 6p-7p, 207 ml/hr x 12 hrs 7p-9a, 50 ml/hr x 1 hr 9a-10a, then off  Add sodium 125 mEq/L to TPN: add as [name] mEq/L NaCl and 55 mEq/L Na Acetate   Lipids at 56ms/hr x 12 hours 3 times weekly on MWF  Standard MVI, Trace elements, and pepcid 40 mg/day in TPN  TPN lab panels on Mondays & Thursdays.  BMET, Mag & Phos daily ordered.  CGretta ArabPharmD, BCPS Pager [phone]/12/2016 8:05 AM

## 2017-07-30 ENCOUNTER — Ambulatory Visit: Payer: Self-pay | Admitting: Surgery

## 2017-07-30 ENCOUNTER — Inpatient Hospital Stay (HOSPITAL_COMMUNITY): Payer: Medicare Other

## 2017-07-30 LAB — COMPREHENSIVE METABOLIC PANEL
ALK PHOS: 137 U/L — AB (ref 38–126)
ALT: 17 U/L (ref 17–63)
ANION GAP: 7 (ref 5–15)
AST: 18 U/L (ref 15–41)
Albumin: 2.2 g/dL — ABNORMAL LOW (ref 3.5–5.0)
BUN: 24 mg/dL — ABNORMAL HIGH (ref 6–20)
CALCIUM: 8.4 mg/dL — AB (ref 8.9–10.3)
CHLORIDE: 106 mmol/L (ref 101–111)
CO2: 26 mmol/L (ref 22–32)
Creatinine, Ser: 0.72 mg/dL (ref 0.61–1.24)
GFR calc Af Amer: >60 mL/min (ref >60)
GFR calc non Af Amer: >60 mL/min (ref >60)
GLUCOSE: 124 mg/dL — AB (ref 65–99)
Potassium: 3.5 mmol/L (ref 3.5–5.1)
Sodium: 139 mmol/L (ref 135–145)
Total Bilirubin: 0.4 mg/dL (ref 0.3–1.2)
Total Protein: 6 g/dL — ABNORMAL LOW (ref 6.5–8.1)

## 2017-07-30 LAB — MAGNESIUM: Magnesium: 1.9 mg/dL (ref 1.7–2.4)

## 2017-07-30 LAB — PHOSPHORUS: Phosphorus: 4 mg/dL (ref 2.5–4.6)

## 2017-07-30 LAB — PREPARE RBC (CROSSMATCH)

## 2017-07-30 MED ORDER — FENTANYL CITRATE (PF) 100 MCG/2ML IJ SOLN
100.0000 ug | INTRAMUSCULAR | Status: DC | PRN
  Administered 2017-07-30 – 2017-08-03 (×45): 100 ug via INTRAVENOUS
  Filled 2017-07-30 (×48): qty 2

## 2017-07-30 MED ORDER — POTASSIUM CHLORIDE 10 MEQ/50ML IV SOLN
10.0000 meq | INTRAVENOUS | Status: AC
  Administered 2017-07-30 (×6): 10 meq via INTRAVENOUS
  Filled 2017-07-30 (×6): qty 50

## 2017-07-30 MED ORDER — MAGNESIUM SULFATE 4 GM/100ML IV SOLN
4.0000 g | Freq: Four times a day (QID) | INTRAVENOUS | Status: AC
  Administered 2017-07-30 (×3): 4 g via INTRAVENOUS
  Filled 2017-07-30 (×3): qty 100

## 2017-07-30 MED ORDER — ENOXAPARIN SODIUM 40 MG/0.4ML ~~LOC~~ SOLN: 40.0000 mg | SUBCUTANEOUS | Status: DC

## 2017-07-30 MED ORDER — TRACE MINERALS CR-CU-MN-SE-ZN 10-1000-500-60 MCG/ML IV SOLN
INTRAVENOUS | Status: AC
  Administered 2017-07-30: 18:00:00 via INTRAVENOUS
  Filled 2017-07-30: qty 1000

## 2017-07-30 MED ORDER — SODIUM CHLORIDE 0.9 % IV SOLN
Freq: Once | INTRAVENOUS | Status: AC
  Administered 2017-07-30: 19:00:00 via INTRAVENOUS

## 2017-07-30 MED ORDER — ENOXAPARIN SODIUM 40 MG/0.4ML ~~LOC~~ SOLN: 40.0000 mg | Freq: Once | SUBCUTANEOUS | Status: DC

## 2017-07-30 NOTE — Progress Notes (Signed)
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 Kaylyn Lim and Dr. Annye English  FEN: NPO, TNA VTE: SCD's, lovenox ID: cultures/abx none currently Foley: none Pain: fentanyl 131mcg q1 PRN Follow up: TBD  DISPO: OR Friday with Dr. Hassell Done and Dr. Dema Severin.     LOS: 85 days    Subjective:  CC; abdominal pain at site of wound vac  Pt states his PCA is making him nauseated and it's not working well. He would like to go back to the fentanyl shot. No other complaints at this time. Wife sleeping at bedside.   Objective: Vital signs in last 24 hours: Temp:  [98.7 F (37.1 C)-99.3 F (37.4 C)] 99.3 F (37.4 C) (10/04 0800) Pulse Rate:  [92-111] 98 (10/04 0600) Resp:  [14-25] 22 (10/04 0600) BP: (99-118)/(55-79) 109/76 (10/04 0600) SpO2:  [95 %-98 %] 96 % (10/04 0600) Last BM Date: 07/27/17 (through a FC )  Intake/Output from previous day: 10/03 0701 - 10/04 0700 In: 2283.5 [I.V.:1783.5; IV Piggyback:500] Out: 2005 [Urine:800; Drains:1205] Intake/Output this shift: No intake/output data recorded.  PE: Gen: Alert, NAD, pleasant, cooperative HEENT: sclerae clear, pupils are equal, moist mucus membranes Card: RRR Pulm: CTA, no W/R/R, rate and effort normal Abd: Soft, not distended, few BS appreciated, vac intact to right side of abdomen, drains with green output and slight odor Skin: no rashes noted, warm and dry Neuro: alert and oriented   Anti-infectives: Anti-infectives    Start     Dose/Rate Route Frequency Ordered Stop   07/08/17 1600  fluconazole (DIFLUCAN) IVPB 100 mg     100 mg 50 mL/hr over 60 Minutes Intravenous Every 24 hours 07/08/17 0828 07/14/17 1710   07/01/17 1500  fluconazole (DIFLUCAN) IVPB 100 mg      100 mg 50 mL/hr over 60 Minutes Intravenous Every 24 hours 07/01/17 1401 07/07/17 1715   06/16/17 1200  piperacillin-tazobactam (ZOSYN) IVPB 3.375 g     3.375 g 12.5 mL/hr over 240 Minutes Intravenous Every 8 hours 06/16/17 1026 06/17/17 0004   06/08/17 1200  piperacillin-tazobactam (ZOSYN) IVPB 3.375 g  Status:  Discontinued     3.375 g 12.5 mL/hr over 240 Minutes Intravenous Every 8 hours 06/08/17 1056 06/16/17 1026   05/29/17 2200  ceFEPIme (MAXIPIME) 2 g in dextrose 5 % 50 mL IVPB  Status:  Discontinued     2 g 100 mL/hr over 30 Minutes Intravenous Every 8 hours 05/29/17 2031 06/03/17 0832   05/29/17 2100  metroNIDAZOLE (FLAGYL) IVPB 500 mg  Status:  Discontinued     500 mg 100 mL/hr over 60 Minutes Intravenous Every 8 hours 05/29/17 2029 06/03/17 0832   05/26/17 2200  ceFAZolin (ANCEF) IVPB 1 g/50 mL premix  Status:  Discontinued     1 g 100 mL/hr over 30 Minutes Intravenous Every 8 hours 05/26/17 1008 05/29/17 2023   05/25/17 1100  vancomycin (VANCOCIN) 1,250 mg in sodium chloride 0.9 % 250 mL IVPB  Status:  Discontinued     1,250 mg 166.7 mL/hr over 90 Minutes Intravenous Every 12 hours 05/25/17 0955 05/26/17 0946   05/22/17 1400  anidulafungin (ERAXIS) 100 mg in sodium chloride 0.9 % 100 mL IVPB     100 mg 78 mL/hr over 100 Minutes Intravenous Every 24 hours 05/21/17 1330 05/27/17 1740   05/21/17 1400  anidulafungin (ERAXIS)  200 mg in sodium chloride 0.9 % 200 mL IVPB     200 mg 78 mL/hr over 200 Minutes Intravenous  Once 05/21/17 1330 05/21/17 1940   05/15/17 1000  anidulafungin (ERAXIS) 100 mg in sodium chloride 0.9 % 100 mL IVPB  Status:  Discontinued     100 mg 78 mL/hr over 100 Minutes Intravenous Every 24 hours 05/14/17 0829 05/16/17 0901   05/15/17 1000  metroNIDAZOLE (FLAGYL) IVPB 500 mg  Status:  Discontinued     500 mg 100 mL/hr over 60 Minutes Intravenous Every 8 hours 05/15/17 0903 05/25/17 0938   05/14/17 0900  anidulafungin (ERAXIS) 200 mg in sodium chloride  0.9 % 200 mL IVPB     200 mg 78 mL/hr over 200 Minutes Intravenous  Once 05/14/17 0829 05/14/17 1325   05/13/17 1927  vancomycin (VANCOCIN) 1-5 GM/200ML-% IVPB    Comments:  Ward, Christa   : cabinet override      05/13/17 1927 05/14/17 0744   05/12/17 1400  ceFEPIme (MAXIPIME) 1 g in dextrose 5 % 50 mL IVPB     1 g 100 mL/hr over 30 Minutes Intravenous Every 8 hours 05/12/17 0939 05/26/17 2359   05/12/17 0900  vancomycin (VANCOCIN) IVPB 1000 mg/200 mL premix  Status:  Discontinued     1,000 mg 200 mL/hr over 60 Minutes Intravenous Every 12 hours 05/12/17 0750 05/16/17 0852   05/12/17 0830  aztreonam (AZACTAM) 2 GM IVPB     2 g 100 mL/hr over 30 Minutes Intravenous  Once 05/12/17 0800 05/12/17 0957   05/06/17 0916  vancomycin (VANCOCIN) IVPB 1000 mg/200 mL premix     1,000 mg 200 mL/hr over 60 Minutes Intravenous On call to O.R. 05/06/17 0916 05/06/17 1229      Lab Results:   Recent Labs  07/27/17 1128  WBC 9.6  HGB 8.6*  HCT 26.9*  PLT 547*   BMET  Recent Labs  07/29/17 0812 07/30/17 0443  NA 140 139  K 4.1 3.5  CL 106 106  CO2 26 26  GLUCOSE 110* 124*  BUN 24* 24*  CREATININE 0.69 0.72  CALCIUM 8.9 8.4*   PT/INR No results for input(s): LABPROT, INR in the last 72 hours. CMP     Component Value Date/Time   NA 139 07/30/2017 0443   K 3.5 07/30/2017 0443   CL 106 07/30/2017 0443   CO2 26 07/30/2017 0443   GLUCOSE 124 (H) 07/30/2017 0443   BUN 24 (H) 07/30/2017 0443   CREATININE 0.72 07/30/2017 0443   CREATININE 0.85 07/22/2013 1021   CALCIUM 8.4 (L) 07/30/2017 0443   PROT 6.0 (L) 07/30/2017 0443   ALBUMIN 2.2 (L) 07/30/2017 0443   AST 18 07/30/2017 0443   ALT 17 07/30/2017 0443   ALKPHOS 137 (H) 07/30/2017 0443   BILITOT 0.4 07/30/2017 0443   GFRNONAA >60 07/30/2017 0443   GFRAA >60 07/30/2017 0443   Lipase     Component Value Date/Time   LIPASE 27 09/15/2015 0934    Studies/Results: No results found.    Kalman Drape , [facility] 07/30/2017, 8:47 AM Pager: [phone] Consults: [phone] Mon-Fri 7:00 am-4:30 pm Sat-Sun 7:00 am-11:30 am

## 2017-07-30 NOTE — Consult Note (Signed)
[place] Nurse wound follow up Wound type:NPWT dressing change to surgical wound  Wound bed:60% red, 40% exposed mess with tan effluent Drainage (amount, consistency, odor) tan effluent in tubing of cannister  Periwound:maceration around entire wound.  Dressing procedure/placement/frequency: Dr. Hassell Done at bedside for dressing change. Spray adhesive remover used to remove old dressing. Wound cleansed and dried. Drape applied to entire periwound area. one piece of white foam placed in wound bed, covered with one piece of black foam and one piece of black foam used as a bridge at the 6 o'clock position to prevent pain from the [facility] pad. All covered with drape, TRAC pad attached, seal immediately achieved at 125 mmHG continuous pressure.  Dressing labeled. Pt states it feels better. Pt to go to OR tomorrow. Will watch for new wound care orders. WOC continues to follow.   Fara Olden, RN-C, WTA-C, [place] [facility]

## 2017-07-30 NOTE — Progress Notes (Signed)
6 cc fentanyl wasted in sink. Pamala Hurry May, RN witnessed waste.

## 2017-07-30 NOTE — Progress Notes (Signed)
PHARMACY - ADULT TOTAL PARENTERAL NUTRITION CONSULT NOTE   Pharmacy Consult for TPN Indication: prolonged ileus, small bowel leak, multiple bowel surgeries  Patient Measurements: Body mass index is 25.21 kg/m. Filed Weights   07/27/17 0356 07/28/17 0357 07/29/17 0451  Weight: 176 lb 2.4 oz (79.9 kg) 177 lb 11.1 oz (80.6 kg) 175 lb 11.3 oz (79.7 kg)   HPI: 35 yoM admitted on 7/11 for enlarging adrenal mass and planned adrenalectomy.  Pharmacy consulted to dose TPN.  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   7/22 OR:  wash out, tube ileostomy, left with open abdomen & wound vac in place.  Plan for OR on 7/25 for closure 7/25 OR: mesh placed for abdominal closure, wound vac 8/2 extubated, propofol drip off 8/4 TPN off for ~ 5 hours due to line detached from filter 8/10 MD request decrease rate x 1 week 2nd low sodium 8/12 Additional sodium added to TPN due to persistent hyponatremia 8/16 Increasing rate back to goal since sodium has improved with above intervention.   8/20 Prealbumin decreased.  New apparent enteric leak per surgery notes. 8/21 CaPhos product elevated, remove electrolytes from TPN 8/25 PICC did not have blood return in 2 of the 3 ports.  Alteplase intra-catheter and function returned.  TPN infusing correctly on 8/26 per RN. 8/28: d/c SSI 8/30: added electrolytes to TPN 8/31: TPN IV line came out around ~0340 and current bag was stopped at this time.  Restart new clinimix 5/15 1L bag (no lytes since phos is elevated this morning) until next bag is due at [facility]. Will not add MVI and trace elements to this 1L  bag. Use electrolyte free clinimix with new bag at [facility] 9/1 NGT out 9/3 added electrolytes to TPN 9/4 lytes out of TPN 9/7 cycle over 18 hrs 9/8 add back electrolytes to TPN.  Cycle over 14 hours 9/9 lytes out of TPN, continue 14 hr cycle, DC SSI/CBGs 9/13 electrolytes added back to TPN 9/14 remove electrolytes from TPN 10/3 Adjusted estimated needs based on prealbumin (decreased on 9/24, low but slightly improved on 10/1) and attempt to maximize nutrition prior to surgery planned for 07/31/17 - required increase of cycle to 16 hours.  Plan fistula-repair surgery on 10/5  Insulin requirements past 24 hours: none (no hx DM)  Current Nutrition: NPO  IVF: none. Lasix 20 IV q48h.  Central access: PICC line ordered 7/19, placed on 7/20 TPN start date: 7/20  ASSESSMENT                                                                                                           Today, 07/30/17  - Glucose from chemistry panel (if drawn with daily labs at 0500 will be during max  cyclic rate) remains at goal <150 -  Electrolytes:  K 3.5  (goal > 4 for ileus), below goal despite aggressive daily replacement.  Expect he will need more replacement after Lasix doses given q48h.  Mag 1.9 (goal > 2 for ileus), daily aggressive replacement w/ 12g/day.  Low yesterday after reducing to 8g/day and Lasix on 10/2.   Na 139, normalized and stable with additional sodium in TPN. Phos 4, improved w/ no electrolytes in TPN.  CorrCa 9.8 , Ca/phos product 39.3 (goal < 55)   - Renal:  SCr wnl - LFTs: WNL except Alk Phos which is elevated but improved - TGs:   206 (9/3), 227 (9/10), 195 (9/16), 193 (9/24), 111(10/1) - Prealbumin:  <5 (7/20), 6.1 (7/24), 10.1 (7/30) 24.8 (8/6), 26.4 (8/13), 15.9 (8/20), 19.7 (8/27) 24.7 (9/3) 23.7 (9/10) 18.5 (9/17), 11.5 (9/24), 13.4 (10/1)  NUTRITIONAL GOALS                                                                                             RD recs (10/2):  137-153 gms  Protein (1.7 - 1.9 g/kg) 2030 - 2270 Kcal  Recalculation (10/3): Clinimix 5/15 3000 mL over 16h cycle with lipids 3x/week on MWF only would provide: 150 g protein and avg 2335 kcal/day  Glucose infusion rate will be 6.5 mg/kg/min at max cyclic rate. (Maximum 7 mg/kg/min)   PLAN                                                                                                                         Now: - KCl 10 meq IV x6 runs  - Mag sulfate 4gm IV x3 doses   At 1800 today:  Continue Clinimix 5/15 (electrolyte-free formulation) for total of 3000 mL/day to cycle over 16hrs  Cycle: 50 ml/hr x 1 hr 6p-7p, 207 ml/hr x 12 hrs 7p-9a, 50 ml/hr x 1 hr 9a-10a, then off  Add sodium 125 mEq/L to TPN: add as 70 mEq/L NaCl and 55 mEq/L Na Acetate   Lipids at 25ms/hr x 12 hours 3 times weekly on MWF  Standard MVI, Trace elements, and pepcid 40 mg/day in TPN  TPN lab panels on Mondays & Thursdays.  BMET, Mag & Phos daily ordered.  CGretta ArabPharmD, BCPS Pager [phone] 276010/01/2017 7:04 AM

## 2017-07-31 ENCOUNTER — Encounter (HOSPITAL_COMMUNITY)
Admission: RE | Disposition: A | Discharge status: Home Health | Payer: Self-pay | Source: Ambulatory Visit | Attending: Surgery

## 2017-07-31 ENCOUNTER — Inpatient Hospital Stay (HOSPITAL_COMMUNITY): Payer: Medicare Other | Admitting: Anesthesiology

## 2017-07-31 ENCOUNTER — Inpatient Hospital Stay (HOSPITAL_COMMUNITY): Payer: Medicare Other

## 2017-07-31 ENCOUNTER — Encounter (HOSPITAL_COMMUNITY): Payer: Self-pay | Admitting: Certified Registered Nurse Anesthetist

## 2017-07-31 HISTORY — PX: INSERTION OF MESH: SHX5868

## 2017-07-31 HISTORY — PX: LAPAROTOMY: SHX154

## 2017-07-31 HISTORY — PX: LAPAROSCOPIC LYSIS OF ADHESIONS: SHX5905

## 2017-07-31 HISTORY — PX: LAPAROSCOPY: SHX197

## 2017-07-31 LAB — CBC
HCT: 25.7 % — ABNORMAL LOW (ref 39.0–52.0)
HCT: 26 % — ABNORMAL LOW (ref 39.0–52.0)
Hemoglobin: 8.3 g/dL — ABNORMAL LOW (ref 13.0–17.0)
Hemoglobin: 8.4 g/dL — ABNORMAL LOW (ref 13.0–17.0)
MCH: 24.3 pg — AB (ref 26.0–34.0)
MCH: 25 pg — AB (ref 26.0–34.0)
MCHC: 32.3 g/dL (ref 30.0–36.0)
MCHC: 32.3 g/dL (ref 30.0–36.0)
MCV: 75.4 fL — AB (ref 78.0–100.0)
MCV: 77.4 fL — AB (ref 78.0–100.0)
PLATELETS: 525 10*3/uL — AB (ref 150–400)
Platelets: 444 10*3/uL — ABNORMAL HIGH (ref 150–400)
RBC: 3.41 MIL/uL — ABNORMAL LOW (ref 4.22–5.81)
RBC: 3.36 MIL/uL — ABNORMAL LOW (ref 4.22–5.81)
RDW: 17.4 % — ABNORMAL HIGH (ref 11.5–15.5)
RDW: 17.2 % — AB (ref 11.5–15.5)
WBC: 10.3 10*3/uL (ref 4.0–10.5)
WBC: 17.4 10*3/uL — AB (ref 4.0–10.5)

## 2017-07-31 LAB — CREATININE, SERUM
Creatinine, Ser: 0.76 mg/dL (ref 0.61–1.24)
GFR calc non Af Amer: >60 mL/min (ref >60)

## 2017-07-31 LAB — POCT I-STAT 4, (NA,K, GLUC, HGB,HCT)
GLUCOSE: 123 mg/dL — AB (ref 65–99)
HEMATOCRIT: 31 % — AB (ref 39.0–52.0)
Hemoglobin: 10.5 g/dL — ABNORMAL LOW (ref 13.0–17.0)
POTASSIUM: 4.8 mmol/L (ref 3.5–5.1)
SODIUM: 139 mmol/L (ref 135–145)

## 2017-07-31 LAB — COMPREHENSIVE METABOLIC PANEL
ALBUMIN: 2.4 g/dL — AB (ref 3.5–5.0)
ALK PHOS: 142 U/L — AB (ref 38–126)
ALT: 18 U/L (ref 17–63)
AST: 21 U/L (ref 15–41)
Anion gap: 8 (ref 5–15)
BUN: 26 mg/dL — ABNORMAL HIGH (ref 6–20)
CALCIUM: 8.7 mg/dL — AB (ref 8.9–10.3)
CHLORIDE: 107 mmol/L (ref 101–111)
CO2: 27 mmol/L (ref 22–32)
CREATININE: 0.72 mg/dL (ref 0.61–1.24)
GFR calc Af Amer: >60 mL/min (ref >60)
GFR calc non Af Amer: >60 mL/min (ref >60)
GLUCOSE: 124 mg/dL — AB (ref 65–99)
Potassium: 3.8 mmol/L (ref 3.5–5.1)
SODIUM: 142 mmol/L (ref 135–145)
Total Bilirubin: 0.5 mg/dL (ref 0.3–1.2)
Total Protein: 6.4 g/dL — ABNORMAL LOW (ref 6.5–8.1)

## 2017-07-31 LAB — PROTIME-INR
INR: 1.1
PROTHROMBIN TIME: 14.2 s (ref 11.4–15.2)

## 2017-07-31 LAB — APTT: aPTT: 46 seconds — ABNORMAL HIGH (ref 24–36)

## 2017-07-31 LAB — PHOSPHORUS: PHOSPHORUS: 4.3 mg/dL (ref 2.5–4.6)

## 2017-07-31 LAB — MAGNESIUM: Magnesium: 1.8 mg/dL (ref 1.7–2.4)

## 2017-07-31 SURGERY — LAPAROTOMY, EXPLORATORY
Anesthesia: General | Site: Abdomen

## 2017-07-31 MED ORDER — PROMETHAZINE HCL 25 MG/ML IJ SOLN: 6.2500 mg | INTRAMUSCULAR | Status: DC | PRN

## 2017-07-31 MED ORDER — DEXAMETHASONE SODIUM PHOSPHATE 10 MG/ML IJ SOLN
INTRAMUSCULAR | Status: DC | PRN
  Administered 2017-07-31: 10 mg via INTRAVENOUS

## 2017-07-31 MED ORDER — FENTANYL CITRATE (PF) 250 MCG/5ML IJ SOLN
INTRAMUSCULAR | Status: AC
  Filled 2017-07-31: qty 5

## 2017-07-31 MED ORDER — ENOXAPARIN SODIUM 30 MG/0.3ML ~~LOC~~ SOLN
30.0000 mg | SUBCUTANEOUS | Status: DC
  Administered 2017-08-01 – 2017-08-05 (×5): 30 mg via SUBCUTANEOUS
  Filled 2017-07-31 (×5): qty 0.3

## 2017-07-31 MED ORDER — SODIUM CHLORIDE 0.9 % IV SOLN
INTRAVENOUS | Status: DC | PRN
  Administered 2017-07-31: 14:00:00 via INTRAVENOUS

## 2017-07-31 MED ORDER — METRONIDAZOLE IN NACL 5-0.79 MG/ML-% IV SOLN: 500.0000 mg | INTRAVENOUS | Status: DC

## 2017-07-31 MED ORDER — SODIUM CHLORIDE 0.9% FLUSH: 9.0000 mL | INTRAVENOUS | Status: DC | PRN

## 2017-07-31 MED ORDER — MAGNESIUM SULFATE 4 GM/100ML IV SOLN
4.0000 g | Freq: Three times a day (TID) | INTRAVENOUS | Status: AC
  Administered 2017-07-31 (×2): 4 g via INTRAVENOUS
  Filled 2017-07-31 (×3): qty 100

## 2017-07-31 MED ORDER — ALBUMIN HUMAN 5 % IV SOLN
INTRAVENOUS | Status: AC
Start: 2017-07-31 — End: ?
  Filled 2017-07-31: qty 250

## 2017-07-31 MED ORDER — CHLORHEXIDINE GLUCONATE CLOTH 2 % EX PADS
6.0000 | MEDICATED_PAD | Freq: Once | CUTANEOUS | Status: AC
  Administered 2017-07-31: 6 via TOPICAL

## 2017-07-31 MED ORDER — 0.9 % SODIUM CHLORIDE (POUR BTL) OPTIME
TOPICAL | Status: DC | PRN
  Administered 2017-07-31: 6000 mL

## 2017-07-31 MED ORDER — DIPHENHYDRAMINE HCL 12.5 MG/5ML PO ELIX: 12.5000 mg | ORAL_SOLUTION | Freq: Four times a day (QID) | ORAL | Status: DC | PRN

## 2017-07-31 MED ORDER — FENTANYL 40 MCG/ML IV SOLN
INTRAVENOUS | Status: DC
  Administered 2017-07-31: 1000 ug via INTRAVENOUS
  Administered 2017-08-01: 60 ug via INTRAVENOUS
  Administered 2017-08-01 (×2): 285 ug via INTRAVENOUS
  Administered 2017-08-01: 18:00:00 via INTRAVENOUS
  Administered 2017-08-01: 30 ug via INTRAVENOUS
  Administered 2017-08-01: 165 ug via INTRAVENOUS
  Administered 2017-08-02: 120 ug via INTRAVENOUS
  Administered 2017-08-02: 21:00:00 via INTRAVENOUS
  Administered 2017-08-02: 150 ug via INTRAVENOUS
  Administered 2017-08-02: 315 ug via INTRAVENOUS
  Administered 2017-08-02: 60 ug via INTRAVENOUS
  Administered 2017-08-02: 165 ug via INTRAVENOUS
  Administered 2017-08-03: 175 ug via INTRAVENOUS
  Administered 2017-08-03: 135 ug via INTRAVENOUS
  Administered 2017-08-03: 60 ug via INTRAVENOUS
  Administered 2017-08-03: 75 ug via INTRAVENOUS
  Administered 2017-08-03: 60 ug via INTRAVENOUS
  Administered 2017-08-04: 180 ug via INTRAVENOUS
  Administered 2017-08-04: 90 ug via INTRAVENOUS
  Administered 2017-08-04: 12:00:00 via INTRAVENOUS
  Administered 2017-08-04: 45 ug via INTRAVENOUS
  Administered 2017-08-05: 15:00:00 via INTRAVENOUS
  Administered 2017-08-05: 105 ug via INTRAVENOUS
  Filled 2017-07-31 (×5): qty 25

## 2017-07-31 MED ORDER — MEPERIDINE HCL 50 MG/ML IJ SOLN: 6.2500 mg | INTRAMUSCULAR | Status: DC | PRN

## 2017-07-31 MED ORDER — ONDANSETRON HCL 4 MG/2ML IJ SOLN
4.0000 mg | Freq: Four times a day (QID) | INTRAMUSCULAR | Status: DC | PRN
  Administered 2017-08-01 – 2017-08-05 (×6): 4 mg via INTRAVENOUS
  Filled 2017-07-31 (×7): qty 2

## 2017-07-31 MED ORDER — ROCURONIUM BROMIDE 50 MG/5ML IV SOSY
PREFILLED_SYRINGE | INTRAVENOUS | Status: AC
  Filled 2017-07-31: qty 5

## 2017-07-31 MED ORDER — ALBUMIN HUMAN 5 % IV SOLN
INTRAVENOUS | Status: AC
  Filled 2017-07-31: qty 250

## 2017-07-31 MED ORDER — NALOXONE HCL 0.4 MG/ML IJ SOLN: 0.4000 mg | INTRAMUSCULAR | Status: DC | PRN

## 2017-07-31 MED ORDER — MIDAZOLAM HCL 2 MG/2ML IJ SOLN
INTRAMUSCULAR | Status: AC
  Filled 2017-07-31: qty 2

## 2017-07-31 MED ORDER — SUCCINYLCHOLINE CHLORIDE 200 MG/10ML IV SOSY
PREFILLED_SYRINGE | INTRAVENOUS | Status: AC
  Filled 2017-07-31: qty 10

## 2017-07-31 MED ORDER — HYDROMORPHONE HCL-NACL 0.5-0.9 MG/ML-% IV SOSY
PREFILLED_SYRINGE | INTRAVENOUS | Status: AC
Start: 2017-07-31 — End: 2017-08-01
  Filled 2017-07-31: qty 2

## 2017-07-31 MED ORDER — HYDROMORPHONE HCL-NACL 0.5-0.9 MG/ML-% IV SOSY
0.2500 mg | PREFILLED_SYRINGE | INTRAVENOUS | Status: DC | PRN
  Administered 2017-07-31: 0.5 mg via INTRAVENOUS

## 2017-07-31 MED ORDER — DIPHENHYDRAMINE HCL 50 MG/ML IJ SOLN: 12.5000 mg | Freq: Four times a day (QID) | INTRAMUSCULAR | Status: DC | PRN

## 2017-07-31 MED ORDER — DEXAMETHASONE SODIUM PHOSPHATE 10 MG/ML IJ SOLN
INTRAMUSCULAR | Status: AC
  Filled 2017-07-31: qty 1

## 2017-07-31 MED ORDER — MIDAZOLAM HCL 5 MG/5ML IJ SOLN
INTRAMUSCULAR | Status: DC | PRN
  Administered 2017-07-31: 2 mg via INTRAVENOUS

## 2017-07-31 MED ORDER — MIDAZOLAM HCL 2 MG/2ML IJ SOLN: 0.5000 mg | Freq: Once | INTRAMUSCULAR | Status: DC | PRN

## 2017-07-31 MED ORDER — ALBUMIN HUMAN 5 % IV SOLN: 12.5000 g | Freq: Once | INTRAVENOUS | Status: DC

## 2017-07-31 MED ORDER — DIPHENHYDRAMINE HCL 12.5 MG/5ML PO ELIX
12.5000 mg | ORAL_SOLUTION | Freq: Four times a day (QID) | ORAL | Status: DC | PRN
  Filled 2017-07-31: qty 5

## 2017-07-31 MED ORDER — PHENYLEPHRINE HCL 10 MG/ML IJ SOLN
INTRAVENOUS | Status: DC | PRN
  Administered 2017-07-31: 25 ug/min via INTRAVENOUS

## 2017-07-31 MED ORDER — SODIUM ACETATE 2 MEQ/ML IV SOLN
INTRAVENOUS | Status: AC
  Administered 2017-07-31: 18:00:00 via INTRAVENOUS
  Filled 2017-07-31 (×2): qty 3000
  Filled 2017-07-31: qty 1000

## 2017-07-31 MED ORDER — SUGAMMADEX SODIUM 200 MG/2ML IV SOLN
INTRAVENOUS | Status: AC
  Filled 2017-07-31: qty 2

## 2017-07-31 MED ORDER — HYDROMORPHONE HCL 2 MG/ML IJ SOLN
INTRAMUSCULAR | Status: AC
  Filled 2017-07-31: qty 1

## 2017-07-31 MED ORDER — ENOXAPARIN SODIUM 40 MG/0.4ML ~~LOC~~ SOLN
40.0000 mg | Freq: Once | SUBCUTANEOUS | Status: DC
  Filled 2017-07-31: qty 0.4

## 2017-07-31 MED ORDER — LACTATED RINGERS IV SOLN: INTRAVENOUS | Status: DC | PRN

## 2017-07-31 MED ORDER — LACTATED RINGERS IV SOLN
INTRAVENOUS | Status: DC | PRN
  Administered 2017-07-31 (×4): via INTRAVENOUS

## 2017-07-31 MED ORDER — HYDROMORPHONE HCL-NACL 0.5-0.9 MG/ML-% IV SOSY
PREFILLED_SYRINGE | INTRAVENOUS | Status: AC
  Filled 2017-07-31: qty 4

## 2017-07-31 MED ORDER — SUGAMMADEX SODIUM 200 MG/2ML IV SOLN
INTRAVENOUS | Status: DC | PRN
  Administered 2017-07-31: 200 mg via INTRAVENOUS

## 2017-07-31 MED ORDER — CIPROFLOXACIN IN D5W 400 MG/200ML IV SOLN
400.0000 mg | INTRAVENOUS | Status: AC
  Administered 2017-07-31: 400 mg via INTRAVENOUS
  Filled 2017-07-31: qty 200

## 2017-07-31 MED ORDER — LIDOCAINE 2% (20 MG/ML) 5 ML SYRINGE
INTRAMUSCULAR | Status: DC | PRN
  Administered 2017-07-31: 100 mg via INTRAVENOUS

## 2017-07-31 MED ORDER — FAT EMULSION 20 % IV EMUL
240.0000 mL | INTRAVENOUS | Status: AC
  Administered 2017-07-31: 240 mL via INTRAVENOUS
  Filled 2017-07-31: qty 250

## 2017-07-31 MED ORDER — METRONIDAZOLE IN NACL 5-0.79 MG/ML-% IV SOLN
500.0000 mg | INTRAVENOUS | Status: AC
  Administered 2017-07-31: 500 mg via INTRAVENOUS
  Filled 2017-07-31: qty 100

## 2017-07-31 MED ORDER — CIPROFLOXACIN IN D5W 400 MG/200ML IV SOLN: 400.0000 mg | INTRAVENOUS | Status: DC

## 2017-07-31 MED ORDER — PROPOFOL 10 MG/ML IV BOLUS
INTRAVENOUS | Status: AC
  Filled 2017-07-31: qty 20

## 2017-07-31 MED ORDER — ROCURONIUM BROMIDE 50 MG/5ML IV SOSY
PREFILLED_SYRINGE | INTRAVENOUS | Status: DC | PRN
  Administered 2017-07-31 (×3): 30 mg via INTRAVENOUS
  Administered 2017-07-31: 50 mg via INTRAVENOUS
  Administered 2017-07-31: 20 mg via INTRAVENOUS
  Administered 2017-07-31: 30 mg via INTRAVENOUS
  Administered 2017-07-31: 10 mg via INTRAVENOUS
  Administered 2017-07-31: 40 mg via INTRAVENOUS
  Administered 2017-07-31 (×2): 20 mg via INTRAVENOUS

## 2017-07-31 MED ORDER — POTASSIUM CHLORIDE 10 MEQ/50ML IV SOLN
10.0000 meq | INTRAVENOUS | Status: DC
  Filled 2017-07-31 (×5): qty 50

## 2017-07-31 MED ORDER — HYDROMORPHONE HCL 1 MG/ML IJ SOLN
INTRAMUSCULAR | Status: DC | PRN
  Administered 2017-07-31 (×2): 1 mg via INTRAVENOUS
  Administered 2017-07-31: 0.5 mg via INTRAVENOUS
  Administered 2017-07-31: 1 mg via INTRAVENOUS

## 2017-07-31 MED ORDER — ROCURONIUM BROMIDE 50 MG/5ML IV SOSY
PREFILLED_SYRINGE | INTRAVENOUS | Status: AC
Start: 2017-07-31 — End: ?
  Filled 2017-07-31: qty 5

## 2017-07-31 MED ORDER — ONDANSETRON HCL 4 MG/2ML IJ SOLN: 4.0000 mg | Freq: Four times a day (QID) | INTRAMUSCULAR | Status: DC | PRN

## 2017-07-31 MED ORDER — ONDANSETRON HCL 4 MG/2ML IJ SOLN
INTRAMUSCULAR | Status: AC
  Filled 2017-07-31: qty 2

## 2017-07-31 MED ORDER — LIDOCAINE 2% (20 MG/ML) 5 ML SYRINGE
INTRAMUSCULAR | Status: AC
  Filled 2017-07-31: qty 5

## 2017-07-31 MED ORDER — LACTATED RINGERS IV SOLN
INTRAVENOUS | Status: DC | PRN
  Administered 2017-07-31: 07:00:00 via INTRAVENOUS

## 2017-07-31 MED ORDER — PROPOFOL 10 MG/ML IV BOLUS
INTRAVENOUS | Status: DC | PRN
  Administered 2017-07-31: 100 mg via INTRAVENOUS

## 2017-07-31 MED ORDER — ONDANSETRON HCL 4 MG/2ML IJ SOLN
INTRAMUSCULAR | Status: DC | PRN
  Administered 2017-07-31: 4 mg via INTRAVENOUS

## 2017-07-31 MED ORDER — ALBUMIN HUMAN 5 % IV SOLN
INTRAVENOUS | Status: DC | PRN
  Administered 2017-07-31 (×2): via INTRAVENOUS

## 2017-07-31 MED ORDER — FENTANYL CITRATE (PF) 100 MCG/2ML IJ SOLN
INTRAMUSCULAR | Status: DC | PRN
  Administered 2017-07-31: 50 ug via INTRAVENOUS
  Administered 2017-07-31: 100 ug via INTRAVENOUS
  Administered 2017-07-31 (×2): 50 ug via INTRAVENOUS
  Administered 2017-07-31 (×2): 100 ug via INTRAVENOUS
  Administered 2017-07-31 (×2): 50 ug via INTRAVENOUS
  Administered 2017-07-31: 100 ug via INTRAVENOUS
  Administered 2017-07-31 (×7): 50 ug via INTRAVENOUS

## 2017-07-31 SURGICAL SUPPLY — 48 items
BLADE EXTENDED COATED 6.5IN (ELECTRODE) IMPLANT
BLADE HEX COATED 2.75 (ELECTRODE) ×2 IMPLANT
COVER MAYO STAND STRL (DRAPES) ×2 IMPLANT
COVER SURGICAL LIGHT HANDLE (MISCELLANEOUS) ×4 IMPLANT
DEVICE SECURE STRAP 25 ABSORB (INSTRUMENTS) ×2 IMPLANT
DRAIN CHANNEL 19F RND (DRAIN) IMPLANT
DRAPE LAPAROSCOPIC ABDOMINAL (DRAPES) ×2 IMPLANT
DRAPE WARM FLUID 44X44 (DRAPE) ×2 IMPLANT
DRSG KUZMA FLUFF (GAUZE/BANDAGES/DRESSINGS) ×2 IMPLANT
ELECT REM PT RETURN 15FT ADLT (MISCELLANEOUS) ×2 IMPLANT
ELECT REM PT RETURN 9FT ADLT (ELECTROSURGICAL) ×2
ELECTRODE REM PT RTRN 9FT ADLT (ELECTROSURGICAL) ×1 IMPLANT
EVACUATOR SILICONE 100CC (DRAIN) IMPLANT
GAUZE SPONGE 4X4 12PLY STRL (GAUZE/BANDAGES/DRESSINGS) ×2 IMPLANT
GOWN SPEC L4 XLG W/TWL (GOWN DISPOSABLE) ×2 IMPLANT
GOWN STRL REUS W/TWL XL LVL3 (GOWN DISPOSABLE) ×12 IMPLANT
HANDLE STAPLE EGIA 4 XL (STAPLE) ×2 IMPLANT
HANDLE SUCTION POOLE (INSTRUMENTS) IMPLANT
KIT BASIN OR (CUSTOM PROCEDURE TRAY) ×2 IMPLANT
MESH VICRYL KNITTED 12X12 (Mesh General) ×4 IMPLANT
NS IRRIG 1000ML POUR BTL (IV SOLUTION) ×4 IMPLANT
PACK COLON (CUSTOM PROCEDURE TRAY) ×2 IMPLANT
PAD ABD 8X10 STRL (GAUZE/BANDAGES/DRESSINGS) ×4 IMPLANT
RELOAD EGIA 60 MED/THCK PURPLE (STAPLE) ×6 IMPLANT
RELOAD TRI 2.0 60 XTHK VAS SUL (STAPLE) ×4 IMPLANT
SEALER TISSUE X1 CVD JAW (INSTRUMENTS) IMPLANT
SPONGE LAP 18X18 X RAY DECT (DISPOSABLE) ×22 IMPLANT
STAPLER VISISTAT 35W (STAPLE) ×2 IMPLANT
SUCTION POOLE HANDLE (INSTRUMENTS)
SUT ETHILON 1 LR 30 (SUTURE) ×6 IMPLANT
SUT NOVA 1 T20/GS 25DT (SUTURE) ×10 IMPLANT
SUT PDS AB 1 CTX 36 (SUTURE) IMPLANT
SUT PDS AB 4-0 SH 27 (SUTURE) ×6 IMPLANT
SUT RET BRIDGE (SUTURE) ×2 IMPLANT
SUT SILK 2 0 (SUTURE) ×1
SUT SILK 2 0 SH CR/8 (SUTURE) ×2 IMPLANT
SUT SILK 2-0 18XBRD TIE 12 (SUTURE) ×1 IMPLANT
SUT SILK 3 0 (SUTURE) ×1
SUT SILK 3 0 SH CR/8 (SUTURE) ×4 IMPLANT
SUT SILK 3-0 18XBRD TIE 12 (SUTURE) ×1 IMPLANT
SUT VIC AB 3-0 SH 18 (SUTURE) ×2 IMPLANT
SUT VICRYL 2 0 18  UND BR (SUTURE)
SUT VICRYL 2 0 18 UND BR (SUTURE) IMPLANT
SYRINGE IRR TOOMEY STRL 70CC (SYRINGE) ×2 IMPLANT
TAPE CLOTH SURG 6X10 WHT LF (GAUZE/BANDAGES/DRESSINGS) ×2 IMPLANT
TOWEL OR 17X26 10 PK STRL BLUE (TOWEL DISPOSABLE) ×2 IMPLANT
TOWEL OR NON WOVEN STRL DISP B (DISPOSABLE) ×2 IMPLANT
TRAY FOLEY W/METER SILVER 16FR (SET/KITS/TRAYS/PACK) ×2 IMPLANT

## 2017-07-31 NOTE — Anesthesia Procedure Notes (Signed)
Procedure Name: Intubation Date/Time: 07/31/2017 8:04 AM Performed by: Maxwell Caul Pre-anesthesia Checklist: Patient identified, Emergency Drugs available, Suction available and Patient being monitored Patient Re-evaluated:Patient Re-evaluated prior to induction Oxygen Delivery Method: Circle system utilized Preoxygenation: Pre-oxygenation with 100% oxygen Induction Type: IV induction Ventilation: Mask ventilation without difficulty and Oral airway inserted - appropriate to patient size Laryngoscope Size: Mac and 4 Grade View: Grade II Tube type: Oral Tube size: 7.5 mm Number of attempts: 1 Airway Equipment and Method: Stylet and Oral airway Placement Confirmation: ETT inserted through vocal cords under direct vision,  positive ETCO2 and breath sounds checked- equal and bilateral Secured at: 22 cm Tube secured with: Tape Dental Injury: Teeth and Oropharynx as per pre-operative assessment

## 2017-07-31 NOTE — Progress Notes (Signed)
[facility] NOTE   Pharmacy Consult for TPN Indication: prolonged ileus, small bowel leak, multiple bowel surgeries  Patient Measurements: Body mass index is 25.18 kg/m. Filed Weights   07/28/17 0357 07/29/17 0451 07/30/17 1352  Weight: 177 lb 11.1 oz (80.6 kg) 175 lb 11.3 oz (79.7 kg) 175 lb 7.8 oz (79.6 kg)   HPI: 11 yoM admitted on 7/11 for enlarging adrenal mass and planned adrenalectomy.  Pharmacy consulted to dose TPN.  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   7/22 OR:  wash out, tube ileostomy, left with open abdomen & wound vac in place.  Plan for OR on 7/25 for closure 7/25 OR: mesh placed for abdominal closure, wound vac 8/2 extubated, propofol drip off 8/4 TPN off for ~ 5 hours due to line detached from filter 8/10 MD request decrease rate x 1 week 2nd low sodium 8/12 Additional sodium added to TPN due to persistent hyponatremia 8/16 Increasing rate back to goal since sodium has improved with above intervention.   8/20 Prealbumin decreased.  New apparent enteric leak per surgery notes. 8/21 CaPhos product elevated, remove electrolytes from TPN 8/25 PICC did not have blood return in 2 of the 3 ports.  Alteplase intra-catheter and function returned.  TPN infusing correctly on 8/26 per RN. 8/28: d/c SSI 8/30: added electrolytes to TPN 8/31: TPN IV line came out around ~0340 and current bag was stopped at this time.  Restart new clinimix 5/15 1L bag (no lytes since phos is elevated this morning) until next bag is due at [facility]. Will not add MVI and trace elements to this 1L  bag. Use electrolyte free clinimix with new bag at [facility] 9/1 NGT out 9/3 added electrolytes to TPN 9/4 lytes out of TPN 9/7 cycle over 18 hrs 9/8 add back electrolytes to TPN.  Cycle over 14 hours 9/9 lytes out of TPN, continue 14 hr cycle, DC SSI/CBGs 9/13 electrolytes added back to TPN 9/14 remove electrolytes from TPN 10/3 Adjusted estimated needs based on prealbumin (decreased on 9/24, low but slightly improved on 10/1) and attempt to maximize nutrition prior to surgery planned for 07/31/17 - required increase of cycle to 16 hours. 10/5 to OR for fistula-repair surgery on 10/5  Insulin requirements past 24 hours: none (no hx DM)  Current Nutrition: NPO  IVF: none.  Central access: PICC line ordered 7/19, placed on 7/20 TPN start date: 7/20  ASSESSMENT                                                                                                           Today, 07/31/17  - Glucose from chemistry panel (if drawn with daily labs at 0500 will be during max cyclic rate)  remains at goal <150 -  Electrolytes: K 3.8  (goal > 4 for ileus), below goal despite aggressive daily replacement. Mag 1.8 (goal > 2 for ileus), daily aggressive replacement w/ 12g/day. Na 142, normalized and stable with additional sodium in TPN. Phos 4.3, improved w/ no electrolytes in TPN. CorrCa wnl; Ca/phos product goal < 55 - Renal:  SCr wnl, stable - LFTs: WNL except Alk Phos which is elevated but improved - TGs:   Slightly elevated prior to starting TPN, but improved to WNL - Prealbumin: Low on admission but trended up and remained at or near goal during Aug - Sep; now slightly below goal but improving  NUTRITIONAL GOALS                                                                                             RD recs (10/2):  137-153 gms Protein (1.7 - 1.9 g/kg) 2030 - 2270 Kcal  Recalculation (10/3): Clinimix 5/15 3000 mL over 16h cycle with lipids 3x/week on MWF only would provide: 150 g protein and avg  2335 kcal/day  Glucose infusion rate will be 6.5 mg/kg/min at max cyclic rate. (Maximum 7 mg/kg/min)   PLAN                                                                                                                         Now: - KCl 10 meq IV x 5 runs  - Mag sulfate 4gm IV x2 doses (would give 3 doses, but patient in OR this AM)  At 1800 today:  Continue Clinimix 5/15 (electrolyte-free formulation) for total of 3000 mL/day to cycle over 16 hr thus:  50 ml/hr x 1 hr (6p-7p), 207 ml/hr x 12 hr (7p-9a), 50 ml/hr x 1 hr (9a-10a), then off  Continue sodium 125 mEq/L in TPN: add as 70 mEq/L NaCl and 55 mEq/L Na Acetate   Lipids at 61ms/hr x 12 hours 3 times weekly on MWF  Standard MVI, Trace elements, and pepcid 40 mg/day in TPN  TPN lab panels on Mondays & Thursdays.  BMET, Mag & Phos daily ordered.  DReuel Boom PharmD, BCPS Pager: [phone]/02/2017, 11:07 AM

## 2017-07-31 NOTE — Progress Notes (Signed)
PACU note--------------------Dr. Linna Caprice, anesthesiologist, gave pt 80 mcg of Precedex in PACU; 120 mcg of Precedex wasted in sink; witnessed by Kandace Blitz, RN

## 2017-07-31 NOTE — Interval H&P Note (Signed)
History and Physical Interval Note:  07/31/2017 7:19 AM  [name] [name]  has presented today for surgery, with the diagnosis of PHEOCHROMOCYTOMA RIGHT  The various methods of treatment have been discussed with the patient and family. After consideration of risks, benefits and other options for treatment, the patient has consented to  Procedure(s): EXPLORATORY LAPAROTOMY, CLOSURE ENTEROTOMICS (N/A) as a surgical intervention .  The patient's history has been reviewed, patient examined, no change in status, stable for surgery.  I have reviewed the patient's chart and labs.  Questions were answered to the patient's satisfaction.     [name] B

## 2017-07-31 NOTE — Transfer of Care (Signed)
Immediate Anesthesia Transfer of Care Note  Patient: [name] [name]  Procedure(s) Performed: EXPLORATORY LAPAROTOMY WITH CLOSURE OF ENTEROTOMICS (N/A Abdomen) LAPAROSCOPY DIAGNOSTIC (N/A Abdomen) LYSIS OF ADHESIONS (N/A Abdomen) INSERTION OF MESH (N/A Abdomen)  Patient Location: PACU  Anesthesia Type:General  Level of Consciousness: awake, alert  and oriented  Airway & Oxygen Therapy: Patient Spontanous Breathing and Patient connected to face mask oxygen  Post-op Assessment: Report given to RN and Post -op Vital signs reviewed and stable  Post vital signs: Reviewed and stable  Last Vitals:  Vitals:   07/31/17 0600 07/31/17 1515  BP: 118/66 (!) 156/119  Pulse: 85 (!) 147  Resp: 17 (!) 34  Temp:    SpO2: 98% 100%    Last Pain:  Vitals:   07/31/17 0400  TempSrc: Oral  PainSc:       Patients Stated Pain Goal: 3 (43/60/67 7034)  Complications: No apparent anesthesia complications

## 2017-07-31 NOTE — Op Note (Signed)
Surgeon: Kaylyn Lim, MD, FACS  Asst:  Annye English, MD  Anes:  general  Preop Dx: Small bowel disconnected; mesh in right upper quadrant with fistula Postop Dx: S/p reconstitution of bowel  Procedure: Laparoscopy, laparotomy, extensive enterolysis with removal of mesh (partially) in the right upper quadrant; resection of old anastomosis and ileotransverse anastomosis Location Surgery: [facility] 2 Operative Time  6.5 hrs  EBL:   200 cc  Drains: none  Description of Procedure:  The patient was taken to OR 2 .  After anesthesia was administered and the patient was prepped a timeout was performed. The betadine prep included the two Foleys in the abdomen and the JP drain.   Access to the abdomen was attempted with the Optiview through the left upper quadant. The left colon was identified and gas was seen escaping from the RUQ wound near the mesh and this was the colonic fistula in the RUQ wound.  The colotomy was later repaired in 2 layers with 4-0 PDS and 3-0 silk.    The operation then turned to the lower midline where I excised his old midline incision and carefully entered the abdomen dodging multiple loops of small bowel that were sharply lysed.  The enterolysis took 4 hours and I gradually sharply lysed the small bowel adhesions and worked my way up in to the right upper quadrant.  The old anastomosis and enterotomies (where Foleys were) and the fistula to the mesh were taken down and the old mesh was resected from the wound. The distal bowel was transected with a purple load Covidien endostapler.  The colon was divided with black load Covidien.    Remnant mesh was sutured and adherent to the liver.  This could not be removed.  The gallbladder was left in situ and should intervention ever be required then it should be done percutaneously.    The right upper quadrant incision was closed with #1 Novafil through the skin and mesh with Vicryl mesh (doubled) and tacked to the undersurface of the incision.     The small intestine was run from the LT to the areas of terminal ileum.  The anastomosis was created with a purple load between the ileum and transverse colon and the common defect was closed in two layers with 4-0 PDS and 3-0 silk.  Copious irrigation performed and xray to check sponge count.  Midline closed with double layer Vicryl mesh onlaying the bowel and with 7 #1 nylon retention sutures with bridges.  Wounds packed with saline soaked gauze.    The patient tolerated the procedure well and was taken to the PACU in stable condition.     Matt B. Hassell Done, [place], [facility] Surgery, [place]

## 2017-07-31 NOTE — H&P (View-Only) (Signed)
Patient ID: [name] [name], male   DOB: June 28, 1961, 56 y.o.   MRN: [id number] [facility] Progress Note:   70 Days Post-Op  Subjective: Mental status is less anxious this morning Objective: Vital signs in last 24 hours: Temp:  [97.8 F (36.6 C)-98.6 F (37 C)] 97.8 F (36.6 C) (10/03 0800) Pulse Rate:  [90-100] 100 (10/03 0400) Resp:  [14-20] 20 (10/03 1250) BP: (81-125)/(39-81) 81/39 (10/03 0400) SpO2:  [95 %-99 %] 96 % (10/03 1250) FiO2 (%):  [98 %-99 %] 98 % (10/03 0400) Weight:  [79.7 kg (175 lb 11.3 oz)] 79.7 kg (175 lb 11.3 oz) (10/03 0451)  Intake/Output from previous day: 10/02 0701 - 10/03 0700 In: 2264.2 [I.V.:1914.2; IV Piggyback:350] Out: 2560 [Urine:1800; Drains:760] Intake/Output this shift: Total I/O In: -  Out: 855 [Urine:200; Drains:655]  Physical Exam: Work of breathing is normal.  Incisions and drains unchanged  Lab Results:  Results for orders placed or performed during the hospital encounter of 05/06/17 (from the past 48 hour(s))  Magnesium     Status: None   Collection Time: 07/28/17  3:49 AM  Result Value Ref Range   Magnesium 2.2 1.7 - 2.4 mg/dL  Phosphorus     Status: None   Collection Time: 07/28/17  3:49 AM  Result Value Ref Range   Phosphorus 4.0 2.5 - 4.6 mg/dL  Comprehensive metabolic panel     Status: Abnormal   Collection Time: 07/28/17  3:49 AM  Result Value Ref Range   Sodium 141 135 - 145 mmol/L   Potassium 3.3 (L) 3.5 - 5.1 mmol/L   Chloride 108 101 - 111 mmol/L   CO2 27 22 - 32 mmol/L   Glucose, Bld 112 (H) 65 - 99 mg/dL   BUN 21 (H) 6 - 20 mg/dL   Creatinine, Ser 0.70 0.61 - 1.24 mg/dL   Calcium 8.2 (L) 8.9 - 10.3 mg/dL   Total Protein 5.8 (L) 6.5 - 8.1 g/dL   Albumin 2.1 (L) 3.5 - 5.0 g/dL   AST 22 15 - 41 U/L   ALT 18 17 - 63 U/L   Alkaline Phosphatase 133 (H) 38 - 126 U/L   Total Bilirubin 0.4 0.3 - 1.2 mg/dL   GFR calc non Af Amer >60 >60 mL/min   GFR calc Af Amer >60 >60 mL/min    Comment: (NOTE) The eGFR  has been calculated using the CKD EPI equation. This calculation has not been validated in all clinical situations. eGFR's persistently <60 mL/min signify possible Chronic Kidney Disease.    Anion gap 6 5 - 15  Comprehensive metabolic panel     Status: Abnormal   Collection Time: 07/29/17  8:12 AM  Result Value Ref Range   Sodium 140 135 - 145 mmol/L   Potassium 4.1 3.5 - 5.1 mmol/L    Comment: DELTA CHECK NOTED REPEATED TO VERIFY NO VISIBLE HEMOLYSIS    Chloride 106 101 - 111 mmol/L   CO2 26 22 - 32 mmol/L   Glucose, Bld 110 (H) 65 - 99 mg/dL   BUN 24 (H) 6 - 20 mg/dL   Creatinine, Ser 0.69 0.61 - 1.24 mg/dL   Calcium 8.9 8.9 - 10.3 mg/dL   Total Protein 6.3 (L) 6.5 - 8.1 g/dL   Albumin 2.3 (L) 3.5 - 5.0 g/dL   AST 19 15 - 41 U/L   ALT 18 17 - 63 U/L   Alkaline Phosphatase 132 (H) 38 - 126 U/L   Total Bilirubin 0.5 0.3 -  1.2 mg/dL   GFR calc non Af Amer >60 >60 mL/min   GFR calc Af Amer >60 >60 mL/min    Comment: (NOTE) The eGFR has been calculated using the CKD EPI equation. This calculation has not been validated in all clinical situations. eGFR's persistently <60 mL/min signify possible Chronic Kidney Disease.    Anion gap 8 5 - 15  Magnesium     Status: Abnormal   Collection Time: 07/29/17  8:12 AM  Result Value Ref Range   Magnesium 1.4 (L) 1.7 - 2.4 mg/dL  Phosphorus     Status: Abnormal   Collection Time: 07/29/17  8:12 AM  Result Value Ref Range   Phosphorus 4.8 (H) 2.5 - 4.6 mg/dL    Radiology/Results: No results found.  Anti-infectives: Anti-infectives    Start     Dose/Rate Route Frequency Ordered Stop   07/08/17 1600  fluconazole (DIFLUCAN) IVPB 100 mg     100 mg 50 mL/hr over 60 Minutes Intravenous Every 24 hours 07/08/17 0828 07/14/17 1710   07/01/17 1500  fluconazole (DIFLUCAN) IVPB 100 mg     100 mg 50 mL/hr over 60 Minutes Intravenous Every 24 hours 07/01/17 1401 07/07/17 1715   06/16/17 1200  piperacillin-tazobactam (ZOSYN) IVPB 3.375 g      3.375 g 12.5 mL/hr over 240 Minutes Intravenous Every 8 hours 06/16/17 1026 06/17/17 0004   06/08/17 1200  piperacillin-tazobactam (ZOSYN) IVPB 3.375 g  Status:  Discontinued     3.375 g 12.5 mL/hr over 240 Minutes Intravenous Every 8 hours 06/08/17 1056 06/16/17 1026   05/29/17 2200  ceFEPIme (MAXIPIME) 2 g in dextrose 5 % 50 mL IVPB  Status:  Discontinued     2 g 100 mL/hr over 30 Minutes Intravenous Every 8 hours 05/29/17 2031 06/03/17 0832   05/29/17 2100  metroNIDAZOLE (FLAGYL) IVPB 500 mg  Status:  Discontinued     500 mg 100 mL/hr over 60 Minutes Intravenous Every 8 hours 05/29/17 2029 06/03/17 0832   05/26/17 2200  ceFAZolin (ANCEF) IVPB 1 g/50 mL premix  Status:  Discontinued     1 g 100 mL/hr over 30 Minutes Intravenous Every 8 hours 05/26/17 1008 05/29/17 2023   05/25/17 1100  vancomycin (VANCOCIN) 1,250 mg in sodium chloride 0.9 % 250 mL IVPB  Status:  Discontinued     1,250 mg 166.7 mL/hr over 90 Minutes Intravenous Every 12 hours 05/25/17 0955 05/26/17 0946   05/22/17 1400  anidulafungin (ERAXIS) 100 mg in sodium chloride 0.9 % 100 mL IVPB     100 mg 78 mL/hr over 100 Minutes Intravenous Every 24 hours 05/21/17 1330 05/27/17 1740   05/21/17 1400  anidulafungin (ERAXIS) 200 mg in sodium chloride 0.9 % 200 mL IVPB     200 mg 78 mL/hr over 200 Minutes Intravenous  Once 05/21/17 1330 05/21/17 1940   05/15/17 1000  anidulafungin (ERAXIS) 100 mg in sodium chloride 0.9 % 100 mL IVPB  Status:  Discontinued     100 mg 78 mL/hr over 100 Minutes Intravenous Every 24 hours 05/14/17 0829 05/16/17 0901   05/15/17 1000  metroNIDAZOLE (FLAGYL) IVPB 500 mg  Status:  Discontinued     500 mg 100 mL/hr over 60 Minutes Intravenous Every 8 hours 05/15/17 0903 05/25/17 0938   05/14/17 0900  anidulafungin (ERAXIS) 200 mg in sodium chloride 0.9 % 200 mL IVPB     200 mg 78 mL/hr over 200 Minutes Intravenous  Once 05/14/17 0829 05/14/17 1325   05/13/17 1927  vancomycin (  VANCOCIN) 1-5 GM/200ML-%  IVPB    Comments:  Ward, Christa   : cabinet override      05/13/17 1927 05/14/17 0744   05/12/17 1400  ceFEPIme (MAXIPIME) 1 g in dextrose 5 % 50 mL IVPB     1 g 100 mL/hr over 30 Minutes Intravenous Every 8 hours 05/12/17 0939 05/26/17 2359   05/12/17 0900  vancomycin (VANCOCIN) IVPB 1000 mg/200 mL premix  Status:  Discontinued     1,000 mg 200 mL/hr over 60 Minutes Intravenous Every 12 hours 05/12/17 0750 05/16/17 0852   05/12/17 0830  aztreonam (AZACTAM) 2 GM IVPB     2 g 100 mL/hr over 30 Minutes Intravenous  Once 05/12/17 0800 05/12/17 0957   05/06/17 0916  vancomycin (VANCOCIN) IVPB 1000 mg/200 mL premix     1,000 mg 200 mL/hr over 60 Minutes Intravenous On call to O.R. 05/06/17 0916 05/06/17 1229      Assessment/Plan: Problem List: Patient Active Problem List   Diagnosis Date Noted  . Hypomagnesemia 06/28/2017  . Hypothyroidism   . 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 Protein-calorie malnutrition, severe ([place]) 06/27/2017  . 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 Pressure injury of skin 05/29/2017  . Ileus ([place])   . Hypokalemia 05/14/2017  . Anastomotic leak of intestine 05/14/2017  . Wound dehiscence, surgical 05/14/2017  . Aspiration pneumonia ([place]) 05/14/2017  . Chronic narcotic use 05/10/2017  . Generalized anxiety disorder 05/10/2017  . Intra-abdominal adhesions s/p SB resection 05/06/2017 05/09/2017  . Throat symptom 03/18/2016  . Multinodular goiter 01/19/2016  . Hyperthyroidism 01/19/2016  . Essential hypertension   . Diarrhea 11/30/2014  . Anemia, iron deficiency 11/01/2014  . Leukocytosis 11/03/2013  . Tobacco abuse 07/26/2013  . COPD (chronic obstructive pulmonary disease) ([place]) 07/26/2013  . Left knee pain 07/26/2013  . Pain in joint, shoulder region 05/03/2013  . GERD 07/24/2008  . 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    Will get anesthesia consult preop 70 Days Post-Op    LOS: 84 days    Matt B. Hassell Done, MD, [facility] [phone] beeper [phone]  07/29/2017 2:14 PM

## 2017-07-31 NOTE — Progress Notes (Signed)
Brief Pharmacy Note re: TPN/electrolytes. For complete details, see note from earlier today by Alanda Amass, PharmD  Istat K+ 4.8 @1447 , Bmet K+ 3.8  @ 0700 Patient is receiving electrolyte-free TPN and supplementing IV KCl & Magnesium.   Although I-stat is less reliable than blood draw, will d/c K+runs planned for today since both labs WNL and re-assess in am.   Netta Cedars, PharmD, BCPS 07/31/2017@5 :50 PM

## 2017-07-31 NOTE — Progress Notes (Signed)
PT Cancellation Note  Patient Details Name: [name] [name] MRN: [id number] DOB: 1961-01-01   Cancelled Treatment:    Reason Eval/Treat Not Completed: Patient at procedure or test/unavailable   [facility] 07/31/2017, 2:18 PM

## 2017-08-01 LAB — MAGNESIUM: Magnesium: 2.4 mg/dL (ref 1.7–2.4)

## 2017-08-01 LAB — BASIC METABOLIC PANEL
Anion gap: 6 (ref 5–15)
BUN: 20 mg/dL (ref 6–20)
CO2: 23 mmol/L (ref 22–32)
Calcium: 7.7 mg/dL — ABNORMAL LOW (ref 8.9–10.3)
Chloride: 110 mmol/L (ref 101–111)
Creatinine, Ser: 0.67 mg/dL (ref 0.61–1.24)
GFR calc Af Amer: >60 mL/min (ref >60)
GFR calc non Af Amer: >60 mL/min (ref >60)
Glucose, Bld: 176 mg/dL — ABNORMAL HIGH (ref 65–99)
Potassium: 3.8 mmol/L (ref 3.5–5.1)
Sodium: 139 mmol/L (ref 135–145)

## 2017-08-01 LAB — GLUCOSE, CAPILLARY
GLUCOSE-CAPILLARY: 93 mg/dL (ref 65–99)
Glucose-Capillary: 173 mg/dL — ABNORMAL HIGH (ref 65–99)
Glucose-Capillary: 115 mg/dL — ABNORMAL HIGH (ref 65–99)
Glucose-Capillary: 139 mg/dL — ABNORMAL HIGH (ref 65–99)

## 2017-08-01 LAB — PHOSPHORUS: Phosphorus: 2 mg/dL — ABNORMAL LOW (ref 2.5–4.6)

## 2017-08-01 LAB — CBC
HCT: 24.7 % — ABNORMAL LOW (ref 39.0–52.0)
Hemoglobin: 8 g/dL — ABNORMAL LOW (ref 13.0–17.0)
MCH: 24.8 pg — ABNORMAL LOW (ref 26.0–34.0)
MCHC: 32.4 g/dL (ref 30.0–36.0)
MCV: 76.7 fL — ABNORMAL LOW (ref 78.0–100.0)
Platelets: 431 10*3/uL — ABNORMAL HIGH (ref 150–400)
RBC: 3.22 MIL/uL — ABNORMAL LOW (ref 4.22–5.81)
RDW: 17.5 % — ABNORMAL HIGH (ref 11.5–15.5)
WBC: 19.3 10*3/uL — ABNORMAL HIGH (ref 4.0–10.5)

## 2017-08-01 MED ORDER — MAGNESIUM SULFATE 4 GM/100ML IV SOLN
4.0000 g | Freq: Once | INTRAVENOUS | Status: AC
  Administered 2017-08-01: 4 g via INTRAVENOUS
  Filled 2017-08-01: qty 100

## 2017-08-01 MED ORDER — HYDROMORPHONE HCL 1 MG/ML IJ SOLN
1.0000 mg | INTRAMUSCULAR | Status: DC | PRN
  Administered 2017-08-01 – 2017-08-02 (×8): 1 mg via INTRAVENOUS
  Filled 2017-08-01 (×8): qty 1

## 2017-08-01 MED ORDER — TRACE MINERALS CR-CU-MN-SE-ZN 10-1000-500-60 MCG/ML IV SOLN
INTRAVENOUS | Status: AC
  Administered 2017-08-01: 18:00:00 via INTRAVENOUS
  Filled 2017-08-01 (×2): qty 3000

## 2017-08-01 MED ORDER — INSULIN ASPART 100 UNIT/ML ~~LOC~~ SOLN
0.0000 [IU] | SUBCUTANEOUS | Status: DC
  Administered 2017-08-01: 2 [IU] via SUBCUTANEOUS
  Administered 2017-08-01: 3 [IU] via SUBCUTANEOUS
  Administered 2017-08-02 (×2): 2 [IU] via SUBCUTANEOUS
  Administered 2017-08-02: 3 [IU] via SUBCUTANEOUS

## 2017-08-01 MED ORDER — ACETAMINOPHEN 650 MG RE SUPP
650.0000 mg | Freq: Four times a day (QID) | RECTAL | Status: DC
  Administered 2017-08-01 – 2017-08-03 (×8): 650 mg via RECTAL
  Filled 2017-08-01 (×7): qty 1

## 2017-08-01 MED ORDER — POTASSIUM CHLORIDE 10 MEQ/50ML IV SOLN
10.0000 meq | INTRAVENOUS | Status: AC
  Administered 2017-08-01 (×3): 10 meq via INTRAVENOUS
  Filled 2017-08-01 (×3): qty 50

## 2017-08-01 MED ORDER — LORAZEPAM 2 MG/ML IJ SOLN
2.0000 mg | INTRAMUSCULAR | Status: DC | PRN
Start: 2017-08-01 — End: 2017-08-16
  Administered 2017-08-01 – 2017-08-16 (×33): 2 mg via INTRAVENOUS
  Filled 2017-08-01 (×35): qty 1

## 2017-08-01 MED ORDER — METHOCARBAMOL 1000 MG/10ML IJ SOLN
1000.0000 mg | Freq: Three times a day (TID) | INTRAVENOUS | Status: DC
  Administered 2017-08-01 – 2017-08-11 (×31): 1000 mg via INTRAVENOUS
  Filled 2017-08-01 (×21): qty 10
  Filled 2017-08-01: qty 600
  Filled 2017-08-01 (×12): qty 10

## 2017-08-01 MED ORDER — POTASSIUM PHOSPHATES 15 MMOLE/5ML IV SOLN
15.0000 mmol | Freq: Once | INTRAVENOUS | Status: AC
  Administered 2017-08-01: 15 mmol via INTRAVENOUS
  Filled 2017-08-01: qty 5

## 2017-08-01 NOTE — Progress Notes (Signed)
Patient unable to achieve pain control on Q1 hr Fentanyl, he reported a constant pain of 10/10. Patient and his spouse requested sedation and PCA. MD on call for [facility] paged, Dr. Kae Heller returned page. Telephone order for standard dose PCA Fentanyl received. Patient and family made aware that sedation is not an option at this time. PCA fentanyl was set up, patient expressed some relief but continues to require PRN and 100 mcg Fentanyl every hour. Patient will continue to be monitored.

## 2017-08-01 NOTE — Progress Notes (Signed)
1 Day Post-Op   Subjective/Chief Complaint: Pain control issues overnight. He currently has a fentanyl patch, fentanyl patches, and a fentanyl PCA in addition to scheduled Valium and when necessary Ativan.   Objective: Vital signs in last 24 hours: Temp:  [97.3 F (36.3 C)-102.5 F (39.2 C)] 101.2 F (38.4 C) (10/06 0342) Pulse Rate:  [96-149] 112 (10/06 0800) Resp:  [17-34] 23 (10/06 0801) BP: (78-161)/(53-119) 151/86 (10/06 0800) SpO2:  [95 %-100 %] 99 % (10/06 0801) Weight:  [84 kg (185 lb 3 oz)] 84 kg (185 lb 3 oz) (10/06 0500) Last BM Date: 07/31/17 (ileostomy tubes)  Intake/Output from previous day: 10/05 0701 - 10/06 0700 In: 9593.8 [I.V.:8428.8; Blood:315; IV Piggyback:850] Out: 2600 [Urine:2300; Blood:300] Intake/Output this shift: Total I/O In: 421.1 [I.V.:421.1] Out: 350 [Urine:350]  General appearance: alert and ncomfortable Resp: clear to auscultation bilaterally GI: soft, nondistended, appropriately tender Incision/Wound: right subcostal wound with interrupted Novafils and some exposed old mesh, some serosanguineous drainage.Right lower abdominal circular 1 cm wound without any signs of infection or drainage .Midline open skin and partial fascial reapproximation with visible vicryl mesh at the base and retention sutures. No abnormal drainage. New damp to dry applied.    Lab Results:   Recent Labs  07/31/17 1740 08/01/17 0317  WBC 17.4* 19.3*  HGB 8.4* 8.0*  HCT 26.0* 24.7*  PLT 444* 431*   BMET  Recent Labs  07/31/17 0700 07/31/17 1309 07/31/17 1740 08/01/17 0317  NA 142 139  --  139  K 3.8 4.8  --  3.8  CL 107  --   --  110  CO2 27  --   --  23  GLUCOSE 124* 123*  --  176*  BUN 26*  --   --  20  CREATININE 0.72  --  0.76 0.67  CALCIUM 8.7*  --   --  7.7*   PT/INR  Recent Labs  07/31/17 0700  LABPROT 14.2  INR 1.10   ABG No results for input(s): PHART, HCO3 in the last 72 hours.  Invalid input(s): PCO2, PO2  Studies/Results: Dg  Chest 2 View  Result Date: 07/30/2017 CLINICAL DATA:  56 year old male preoperative study for abdominal surgery. EXAM: CHEST  2 VIEW COMPARISON:  CT Abdomen and Pelvis 06/29/2017 and earlier. FINDINGS: Right PICC line in place. Lung volumes are within normal limits. Small right pleural effusion. No pneumothorax or pneumoperitoneum. No pulmonary edema or other confluent pulmonary opacity. Stable cardiac size and mediastinal contours. Visualized tracheal air column is within normal limits. Partially visible cervical ACDF hardware. Paucity of bowel gas in the upper abdomen. No acute osseous abnormality identified. IMPRESSION: 1. Small right pleural effusion. 2. No other acute cardiopulmonary abnormality. Electronically Signed   By: Genevie Ann M.D.   On: 07/30/2017 15:39   Dg Abd Portable 1v  Result Date: 07/31/2017 CLINICAL DATA:  56 y/o M; incorrect sponge count, evaluate for foreign body. EXAM: PORTABLE ABDOMEN - 1 VIEW COMPARISON:  None. FINDINGS: Normal bowel gas pattern. Pneumoperitoneum, expected postoperatively. Tip of enteric tube is seen projecting over gastric body. Right upper quadrant surgical clip. Surgical sutures project over the left paramedian mid abdomen. Bones are unremarkable. No unexpected radiopaque foreign body. IMPRESSION: No unexpected radiopaque foreign body. These results were called by telephone at the time of interpretation on 07/31/2017 at 2:56 pm to the OR. Electronically Signed   By: Kristine Garbe M.D.   On: 07/31/2017 14:56    Anti-infectives: Anti-infectives    Start  Dose/Rate Route Frequency Ordered Stop   08/01/17 0600  metroNIDAZOLE (FLAGYL) IVPB 500 mg  Status:  Discontinued     500 mg 100 mL/hr over 60 Minutes Intravenous On call to O.R. 07/31/17 1656 07/31/17 1709   08/01/17 0600  ciprofloxacin (CIPRO) IVPB 400 mg  Status:  Discontinued     400 mg 200 mL/hr over 60 Minutes Intravenous On call to O.R. 07/31/17 1656 07/31/17 1709   07/31/17 0556   metroNIDAZOLE (FLAGYL) IVPB 500 mg     500 mg 100 mL/hr over 60 Minutes Intravenous On call to O.R. 07/31/17 0556 07/31/17 0840   07/31/17 0556  ciprofloxacin (CIPRO) IVPB 400 mg     400 mg 200 mL/hr over 60 Minutes Intravenous On call to O.R. 07/31/17 0556 07/31/17 0914   07/08/17 1600  fluconazole (DIFLUCAN) IVPB 100 mg     100 mg 50 mL/hr over 60 Minutes Intravenous Every 24 hours 07/08/17 0828 07/14/17 1710   07/01/17 1500  fluconazole (DIFLUCAN) IVPB 100 mg     100 mg 50 mL/hr over 60 Minutes Intravenous Every 24 hours 07/01/17 1401 07/07/17 1715   06/16/17 1200  piperacillin-tazobactam (ZOSYN) IVPB 3.375 g     3.375 g 12.5 mL/hr over 240 Minutes Intravenous Every 8 hours 06/16/17 1026 06/17/17 0004   06/08/17 1200  piperacillin-tazobactam (ZOSYN) IVPB 3.375 g  Status:  Discontinued     3.375 g 12.5 mL/hr over 240 Minutes Intravenous Every 8 hours 06/08/17 1056 06/16/17 1026   05/29/17 2200  ceFEPIme (MAXIPIME) 2 g in dextrose 5 % 50 mL IVPB  Status:  Discontinued     2 g 100 mL/hr over 30 Minutes Intravenous Every 8 hours 05/29/17 2031 06/03/17 0832   05/29/17 2100  metroNIDAZOLE (FLAGYL) IVPB 500 mg  Status:  Discontinued     500 mg 100 mL/hr over 60 Minutes Intravenous Every 8 hours 05/29/17 2029 06/03/17 0832   05/26/17 2200  ceFAZolin (ANCEF) IVPB 1 g/50 mL premix  Status:  Discontinued     1 g 100 mL/hr over 30 Minutes Intravenous Every 8 hours 05/26/17 1008 05/29/17 2023   05/25/17 1100  vancomycin (VANCOCIN) 1,250 mg in sodium chloride 0.9 % 250 mL IVPB  Status:  Discontinued     1,250 mg 166.7 mL/hr over 90 Minutes Intravenous Every 12 hours 05/25/17 0955 05/26/17 0946   05/22/17 1400  anidulafungin (ERAXIS) 100 mg in sodium chloride 0.9 % 100 mL IVPB     100 mg 78 mL/hr over 100 Minutes Intravenous Every 24 hours 05/21/17 1330 05/27/17 1740   05/21/17 1400  anidulafungin (ERAXIS) 200 mg in sodium chloride 0.9 % 200 mL IVPB     200 mg 78 mL/hr over 200 Minutes  Intravenous  Once 05/21/17 1330 05/21/17 1940   05/15/17 1000  anidulafungin (ERAXIS) 100 mg in sodium chloride 0.9 % 100 mL IVPB  Status:  Discontinued     100 mg 78 mL/hr over 100 Minutes Intravenous Every 24 hours 05/14/17 0829 05/16/17 0901   05/15/17 1000  metroNIDAZOLE (FLAGYL) IVPB 500 mg  Status:  Discontinued     500 mg 100 mL/hr over 60 Minutes Intravenous Every 8 hours 05/15/17 0903 05/25/17 0938   05/14/17 0900  anidulafungin (ERAXIS) 200 mg in sodium chloride 0.9 % 200 mL IVPB     200 mg 78 mL/hr over 200 Minutes Intravenous  Once 05/14/17 0829 05/14/17 1325   05/13/17 1927  vancomycin (VANCOCIN) 1-5 GM/200ML-% IVPB    Comments:  Ward, Christa   :  cabinet override      05/13/17 1927 05/14/17 0744   05/12/17 1400  ceFEPIme (MAXIPIME) 1 g in dextrose 5 % 50 mL IVPB     1 g 100 mL/hr over 30 Minutes Intravenous Every 8 hours 05/12/17 0939 05/26/17 2359   05/12/17 0900  vancomycin (VANCOCIN) IVPB 1000 mg/200 mL premix  Status:  Discontinued     1,000 mg 200 mL/hr over 60 Minutes Intravenous Every 12 hours 05/12/17 0750 05/16/17 0852   05/12/17 0830  aztreonam (AZACTAM) 2 GM IVPB     2 g 100 mL/hr over 30 Minutes Intravenous  Once 05/12/17 0800 05/12/17 0957   05/06/17 0916  vancomycin (VANCOCIN) IVPB 1000 mg/200 mL premix     1,000 mg 200 mL/hr over 60 Minutes Intravenous On call to O.R. 05/06/17 0916 05/06/17 1229      Assessment/Plan: s/p Procedure(s): EXPLORATORY LAPAROTOMY WITH CLOSURE OF ENTEROTOMICS (N/A) LAPAROSCOPY DIAGNOSTIC (N/A) LYSIS OF ADHESIONS (N/A) INSERTION OF MESH (N/A)  Neuro: will schedule tylenol suppositories, add scheduled IV robaxin, and change breakthrough push meds from fentanyl to dilaudid. May have some desensitization to the fentanyl by this point.  Cardiac: tachycardia likely due to pain, hypertensive Pulmonary: aggressive pulmonary toilet, incentive spirometer, Sats 99% on nasal canula GI: continue NG suction, NPO, await return of bowel  function Renal: urine output and elytes are OK, continue foley for now Heme: acute blood loss anemia and anemia of chronic disease, stable, continue to monitor ID: Fever overnight and Leukocytosis to 19, not unexpected post op day 1, recheck daily. He is NOT presently on antibiotics Endocrine: blood glucose 176 on todays labs. Start sliding scale insulin FEN: TPN, NPO  Dispo: remain in ICU. Work on pain control. Await return of bowel function.   LOS: 87 days    Clovis Riley 08/01/2017

## 2017-08-01 NOTE — Progress Notes (Signed)
[facility] NOTE   Pharmacy Consult for TPN Indication: prolonged ileus, small bowel leak, multiple bowel surgeries  Patient Measurements: Body mass index is 26.57 kg/m. Filed Weights   07/29/17 0451 07/30/17 1352 08/01/17 0500  Weight: 175 lb 11.3 oz (79.7 kg) 175 lb 7.8 oz (79.6 kg) 185 lb 3 oz (84 kg)   HPI: 31 yoM admitted on 7/11 for enlarging adrenal mass and planned adrenalectomy.  Pharmacy consulted to dose TPN.  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   7/22 OR:  wash out, tube ileostomy, left with open abdomen & wound vac in place.  Plan for OR on 7/25 for closure 7/25 OR: mesh placed for abdominal closure, wound vac 8/2 extubated, propofol drip off 8/4 TPN off for ~ 5 hours due to line detached from filter 8/10 MD request decrease rate x 1 week 2nd low sodium 8/12 Additional sodium added to TPN due to persistent hyponatremia 8/16 Increasing rate back to goal since sodium has improved with above intervention.   8/20 Prealbumin decreased.  New apparent enteric leak per surgery notes. 8/21 CaPhos product elevated, remove electrolytes from TPN 8/25 PICC did not have blood return in 2 of the 3 ports.  Alteplase intra-catheter and function returned.  TPN infusing correctly on 8/26 per RN. 8/28: d/c SSI 8/30: added electrolytes to TPN 8/31: TPN IV line came out around ~0340 and current bag was stopped at this time.  Restart new clinimix 5/15 1L bag (no lytes since phos is elevated this morning) until next bag is due at [facility]. Will not add MVI and trace elements to this 1L bag.  Use electrolyte free clinimix with [facility] bag at [facility] 9/1 NGT out 9/3 added electrolytes to TPN 9/4 lytes out of TPN 9/7 cycle over 18 hrs 9/8 add back electrolytes to TPN.  Cycle over 14 hours 9/9 lytes out of TPN, continue 14 hr cycle, DC SSI/CBGs 9/13 electrolytes added back to TPN 9/14 remove electrolytes from TPN 10/3 Adjusted estimated needs based on prealbumin (decreased on 9/24, low but slightly improved on 10/1) and attempt to maximize nutrition prior to surgery planned for 07/31/17 - required increase of cycle to 16 hours. 10/5 to OR for fistula-repair surgery  Insulin requirements past 24 hours: none (no hx DM)  Current Nutrition: NPO  IVF: none.  Central access: PICC line ordered 7/19, placed on 7/20 TPN start date: 7/20  ASSESSMENT                                                                                                           Today, 08/01/17  - Glucose from chemistry panel (if drawn with daily labs at 0500 will be during max cyclic rate) remains at  goal <150 -  Electrolytes: K 3.8  (goal > 4 for ileus), K runs not given yesterday d/t IStat reading 4.8 postoperatively; however K did not drop overnight despite no supplementation. Mag 2.4 (goal > 2 for ileus), daily aggressive replacement w/ 12g/day. Phos 2.0, now low w/ no electrolytes in TPN. Sodium normalized and stable with additional sodium in TPN. CorrCa borderline low; Ca/phos product < 55 - Renal:  SCr wnl, stable - LFTs: WNL except Alk Phos which is elevated but improved - TGs:   Slightly elevated prior to starting TPN, but improved to WNL - Prealbumin: Low on admission but trended up and remained at or near goal during Aug - Sep; now slightly below goal but improving  NUTRITIONAL GOALS                                                                                             RD recs (10/2):  137-153 gms Protein (1.7 - 1.9 g/kg) 2030 - 2270 Kcal  Recalculation (10/3): Clinimix 5/15 3000 mL over 16h cycle  with lipids 3x/week on MWF only would provide: 150 g protein and avg 2335 kcal/day  Glucose infusion rate will be 6.5 mg/kg/min at max cyclic rate. (Maximum 7 mg/kg/min)   PLAN                                                                                                                         Kphos 15 mmol IV x 1 KCl 10 mEq IV x 3 (plus 22.5 mEq in Kphos) Mag 4g x 1 over 4 hr; at goal but has previously required very 8-12 g daily just to maintain levels  At 1800 today:  Continue Clinimix E 5/15 (switching back to electrolyte formulation with Phos and Ca dropping) for total of 3000 mL/day to cycle over 16 hr thus:  50 ml/hr x 1 hr (6p-7p), 207 ml/hr x 12 hr (7p-9a), 50 ml/hr x 1 hr (9a-10a), then off  Continue sodium 125 mEq/L in TPN: add as 70 mEq/L NaCl and 55 mEq/L Na Acetate   Lipids at 98ms/hr x 12 hours 3 times weekly on MWF  Standard MVI, Trace elements, and pepcid 40 mg/day in TPN  Not checking CBGs; SBGs stable at goal and no Hx DM  TPN lab panels on Mondays & Thursdays.  BMET, Mag & Phos daily ordered.  DReuel Boom PharmD, BCPS Pager: [phone]/03/2017, 8:16 AM

## 2017-08-02 LAB — BASIC METABOLIC PANEL
Anion gap: 5 (ref 5–15)
BUN: 20 mg/dL (ref 6–20)
CHLORIDE: 109 mmol/L (ref 101–111)
CO2: 24 mmol/L (ref 22–32)
CREATININE: 0.6 mg/dL — AB (ref 0.61–1.24)
Calcium: 8.1 mg/dL — ABNORMAL LOW (ref 8.9–10.3)
GFR calc non Af Amer: >60 mL/min (ref >60)
GLUCOSE: 110 mg/dL — AB (ref 65–99)
Potassium: 4.2 mmol/L (ref 3.5–5.1)
Sodium: 138 mmol/L (ref 135–145)

## 2017-08-02 LAB — GLUCOSE, CAPILLARY
GLUCOSE-CAPILLARY: 148 mg/dL — AB (ref 65–99)
GLUCOSE-CAPILLARY: 153 mg/dL — AB (ref 65–99)
GLUCOSE-CAPILLARY: 101 mg/dL — AB (ref 65–99)
GLUCOSE-CAPILLARY: 108 mg/dL — AB (ref 65–99)
Glucose-Capillary: 139 mg/dL — ABNORMAL HIGH (ref 65–99)
Glucose-Capillary: 86 mg/dL (ref 65–99)

## 2017-08-02 LAB — CBC
HEMATOCRIT: 19.9 % — AB (ref 39.0–52.0)
HEMOGLOBIN: 6.6 g/dL — AB (ref 13.0–17.0)
MCH: 25.6 pg — AB (ref 26.0–34.0)
MCHC: 33.2 g/dL (ref 30.0–36.0)
MCV: 77.1 fL — AB (ref 78.0–100.0)
Platelets: 406 10*3/uL — ABNORMAL HIGH (ref 150–400)
RBC: 2.58 MIL/uL — ABNORMAL LOW (ref 4.22–5.81)
RDW: 18.2 % — ABNORMAL HIGH (ref 11.5–15.5)
WBC: 18.9 10*3/uL — ABNORMAL HIGH (ref 4.0–10.5)

## 2017-08-02 LAB — PHOSPHORUS: PHOSPHORUS: 3.3 mg/dL (ref 2.5–4.6)

## 2017-08-02 LAB — MAGNESIUM: Magnesium: 1.6 mg/dL — ABNORMAL LOW (ref 1.7–2.4)

## 2017-08-02 LAB — PREPARE RBC (CROSSMATCH)

## 2017-08-02 MED ORDER — KETOROLAC TROMETHAMINE 30 MG/ML IJ SOLN
30.0000 mg | Freq: Three times a day (TID) | INTRAMUSCULAR | Status: DC
  Administered 2017-08-02: 30 mg via INTRAVENOUS
  Filled 2017-08-02: qty 1

## 2017-08-02 MED ORDER — SODIUM CHLORIDE 0.9 % IV SOLN
Freq: Once | INTRAVENOUS | Status: AC
  Administered 2017-08-02: 12:00:00 via INTRAVENOUS

## 2017-08-02 MED ORDER — SODIUM CHLORIDE 0.9 % IV SOLN
Freq: Once | INTRAVENOUS | Status: AC
  Administered 2017-08-02: 09:00:00 via INTRAVENOUS

## 2017-08-02 MED ORDER — MAGNESIUM SULFATE 4 GM/100ML IV SOLN
4.0000 g | Freq: Once | INTRAVENOUS | Status: DC
  Filled 2017-08-02: qty 100

## 2017-08-02 MED ORDER — CHLORHEXIDINE GLUCONATE CLOTH 2 % EX PADS
6.0000 | MEDICATED_PAD | Freq: Every day | CUTANEOUS | Status: AC
  Administered 2017-08-02 – 2017-08-06 (×4): 6 via TOPICAL

## 2017-08-02 MED ORDER — SODIUM CHLORIDE 4 MEQ/ML IV SOLN
INTRAVENOUS | Status: AC
  Administered 2017-08-02: 19:00:00 via INTRAVENOUS
  Filled 2017-08-02: qty 2000

## 2017-08-02 MED ORDER — INSULIN ASPART 100 UNIT/ML ~~LOC~~ SOLN
0.0000 [IU] | Freq: Four times a day (QID) | SUBCUTANEOUS | Status: DC
  Administered 2017-08-03 – 2017-08-04 (×3): 2 [IU] via SUBCUTANEOUS

## 2017-08-02 MED ORDER — DIAZEPAM 5 MG/ML IJ SOLN
5.0000 mg | Freq: Three times a day (TID) | INTRAMUSCULAR | Status: DC
  Administered 2017-08-02 – 2017-08-11 (×21): 5 mg via INTRAVENOUS
  Filled 2017-08-02 (×23): qty 2

## 2017-08-02 MED ORDER — MAGNESIUM SULFATE 4 GM/100ML IV SOLN
4.0000 g | Freq: Three times a day (TID) | INTRAVENOUS | Status: AC
  Administered 2017-08-02 (×3): 4 g via INTRAVENOUS
  Filled 2017-08-02 (×3): qty 100

## 2017-08-02 MED ORDER — POTASSIUM CHLORIDE 10 MEQ/50ML IV SOLN
10.0000 meq | INTRAVENOUS | Status: AC
  Administered 2017-08-02 (×2): 10 meq via INTRAVENOUS
  Filled 2017-08-02 (×2): qty 50

## 2017-08-02 MED ORDER — FENTANYL 100 MCG/HR TD PT72
175.0000 ug | MEDICATED_PATCH | TRANSDERMAL | Status: DC
  Administered 2017-08-02 – 2017-09-01 (×11): 175 ug via TRANSDERMAL
  Filled 2017-08-02 (×13): qty 1

## 2017-08-02 NOTE — Progress Notes (Signed)
[place] NOTE   Pharmacy Consult for TPN Indication: prolonged ileus, small bowel leak, multiple bowel surgeries  Patient Measurements: Body mass index is 26.57 kg/m. Filed Weights   07/29/17 0451 07/30/17 1352 08/01/17 0500  Weight: 175 lb 11.3 oz (79.7 kg) 175 lb 7.8 oz (79.6 kg) 185 lb 3 oz (84 kg)   HPI: 32 yoM admitted on 7/11 for enlarging adrenal mass and planned adrenalectomy.  Pharmacy consulted to dose TPN.  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   7/22 OR:  wash out, tube ileostomy, left with open abdomen & wound vac in place.  Plan for OR on 7/25 for closure 7/25 OR: mesh placed for abdominal closure, wound vac 8/2 extubated, propofol drip off 8/4 TPN off for ~ 5 hours due to line detached from filter 8/10 MD request decrease rate x 1 week 2nd low sodium 8/12 Additional sodium added to TPN due to persistent hyponatremia 8/16 Increasing rate back to goal since sodium has improved with above intervention.   8/20 Prealbumin decreased.  New apparent enteric leak per surgery notes. 8/21 CaPhos product elevated, remove electrolytes from TPN 8/25 PICC did not have blood return in 2 of the 3 ports.  Alteplase intra-catheter and function returned.  TPN infusing correctly on 8/26 per RN. 8/28: d/c SSI 8/30: added electrolytes to TPN 8/31: TPN IV line came out around ~0340 and current bag was stopped at this time.  Restart new clinimix 5/15 1L bag (no lytes since phos is elevated this morning) until next bag is due at [facility]. Will not add MVI and trace elements to this 1L bag.  Use electrolyte free clinimix with new bag at [facility] 9/1 NGT out 9/3 added electrolytes to TPN 9/4 lytes out of TPN 9/7 cycle over 18 hrs 9/8 add back electrolytes to TPN.  Cycle over 14 hours 9/9 lytes out of TPN, continue 14 hr cycle, DC SSI/CBGs 9/13 electrolytes added back to TPN 9/14 remove electrolytes from TPN 10/3 Adjusted estimated needs based on prealbumin (decreased on 9/24, low but slightly improved on 10/1) and attempt to maximize nutrition prior to surgery planned for 07/31/17 - required increase of cycle to 16 hours. 10/5 to OR for fistula-repair surgery 10/6: Lytes added back to TPN with low Phos  Insulin requirements past 24 hours: 5 units RAI ([facility] added SSI)  Current Nutrition: NPO  IVF: none.  Central access: PICC line ordered 7/19, placed on 7/20 TPN start date: 7/20  ASSESSMENT                                                                                                           Today, 08/02/17  - Glucose:  received Decadron perioperatively 10/5 and had  SBG > 150, so CCS added SSI. Subsequent CBG/SBGs all at goal (100-150) - Electrolytes: K at goal after 52 mEq yesterday plus lytes in TPN - appears to be requiring less supplementation for maintenance. Mag decreased 2.4 >> 1.6 after 4g plus lytes in TPN - still requiring aggressive replacement. Phos improved to WNL after 15 mmol plus lytes in TPN - expect this will continue to rise as has previously been the reason for removing lytes from TPN. Sodium normalized and stable with additional sodium in TPN. Corr Ca improved to WNL with addition of lytes to TPN - Ca/phos product < 55 - Renal:  SCr wnl, stable - LFTs:  WNL except Alk Phos which is slightly elevated but relatively stable - TGs:  Slightly elevated prior to starting TPN, but improved to WNL - Prealbumin: Low on admission but trended up and remained at or near goal during Aug - Sep; now slightly below goal but improving  NUTRITIONAL GOALS                                                                                              RD recs (10/2):  137-153 gms Protein (1.7 - 1.9 g/kg) 2030 - 2270 Kcal  Recalculation (10/3): Clinimix 5/15 3000 mL over 16h cycle with lipids 3x/week on MWF only would provide: 150 g protein and avg 2335 kcal/day  Glucose infusion rate will be 6.5 mg/kg/min at max cyclic rate. (Maximum 7 mg/kg/min)  Electrolyte goals:  K >/= 4.0; Mg >/= 2.0 with ileus  PLAN                                                                                                                           KCl 10 mEq IV x 2  Mag 4g IV q8 hr x 3 (given over 4 hr)  At 1800 today:  Continue Clinimix E 5/15 for total of 3000 mL/day to cycle over 16 hr thus:  50 ml/hr x 1 hr (6p-7p), 207 ml/hr x 12 hr (7p-9a), 50 ml/hr x 1 hr (9a-10a), then off  Continue sodium 125 mEq/L in TPN: add as 70 mEq/L NaCl and 55 mEq/L Na Acetate   Lipids at 61ms/hr x 12 hours 3 times weekly on MWF  Standard MVI, Trace elements, and pepcid 40 mg/day in TPN  Mod SSI per CCS - will change CBG checks to q6 hrs as patient appears relatively well controlled  TPN lab panels on Mondays & Thursdays.  BMET, Mag & Phos daily ordered.  DReuel Boom PharmD, BCPS Pager: [phone] 386210/04/2017, 7:29 AM

## 2017-08-02 NOTE — Progress Notes (Signed)
2 Days Post-Op   Subjective/Chief Complaint: Continues to have pain issues.  Also having low grade fevers consistent with atelectasis.  Pt thinks fentanyl works better at this point.    Objective: Vital signs in last 24 hours: Temp:  [99 F (37.2 C)-100.6 F (38.1 C)] 99.9 F (37.7 C) (10/07 0800) Pulse Rate:  [109-124] 113 (10/07 0600) Resp:  [19-31] 28 (10/07 0742) BP: (107-140)/(54-82) 124/61 (10/07 0600) SpO2:  [97 %-100 %] 100 % (10/07 0742) Last BM Date: 07/31/17 (ileostomy tubes)  Intake/Output from previous day: 10/06 0701 - 10/07 0700 In: 3485.6 [I.V.:2800.6; IV Piggyback:685] Out: 3600 [Urine:2100; Emesis/NG output:1500] Intake/Output this shift: No intake/output data recorded.  General appearance: alert and uncomfortable Resp: shallow respirations.   GI: soft, nondistended, appropriately tender Incision/Wound: right subcostal wound with interrupted Novafils and some exposed old mesh, some serosanguineous drainage.Right lower abdominal circular 1 cm wound without any signs of infection or drainage .Midline open skin and partial fascial reapproximation and retention sutures. No abnormal drainage. New damp to dry applied.    Lab Results:   Recent Labs  08/01/17 0317 08/02/17 0531  WBC 19.3* 18.9*  HGB 8.0* 6.6*  HCT 24.7* 19.9*  PLT 431* 406*   BMET  Recent Labs  08/01/17 0317 08/02/17 0531  NA 139 138  K 3.8 4.2  CL 110 109  CO2 23 24  GLUCOSE 176* 110*  BUN 20 20  CREATININE 0.67 0.60*  CALCIUM 7.7* 8.1*   PT/INR  Recent Labs  07/31/17 0700  LABPROT 14.2  INR 1.10   ABG No results for input(s): PHART, HCO3 in the last 72 hours.  Invalid input(s): PCO2, PO2  Studies/Results: Dg Abd Portable 1v  Result Date: 07/31/2017 CLINICAL DATA:  56 y/o M; incorrect sponge count, evaluate for foreign body. EXAM: PORTABLE ABDOMEN - 1 VIEW COMPARISON:  None. FINDINGS: Normal bowel gas pattern. Pneumoperitoneum, expected postoperatively. Tip of enteric  tube is seen projecting over gastric body. Right upper quadrant surgical clip. Surgical sutures project over the left paramedian mid abdomen. Bones are unremarkable. No unexpected radiopaque foreign body. IMPRESSION: No unexpected radiopaque foreign body. These results were called by telephone at the time of interpretation on 07/31/2017 at 2:56 pm to the OR. Electronically Signed   By: Kristine Garbe M.D.   On: 07/31/2017 14:56    Anti-infectives: Anti-infectives    Start     Dose/Rate Route Frequency Ordered Stop   08/01/17 0600  metroNIDAZOLE (FLAGYL) IVPB 500 mg  Status:  Discontinued     500 mg 100 mL/hr over 60 Minutes Intravenous On call to O.R. 07/31/17 1656 07/31/17 1709   08/01/17 0600  ciprofloxacin (CIPRO) IVPB 400 mg  Status:  Discontinued     400 mg 200 mL/hr over 60 Minutes Intravenous On call to O.R. 07/31/17 1656 07/31/17 1709   07/31/17 0556  metroNIDAZOLE (FLAGYL) IVPB 500 mg     500 mg 100 mL/hr over 60 Minutes Intravenous On call to O.R. 07/31/17 0556 07/31/17 0840   07/31/17 0556  ciprofloxacin (CIPRO) IVPB 400 mg     400 mg 200 mL/hr over 60 Minutes Intravenous On call to O.R. 07/31/17 0556 07/31/17 0914   07/08/17 1600  fluconazole (DIFLUCAN) IVPB 100 mg     100 mg 50 mL/hr over 60 Minutes Intravenous Every 24 hours 07/08/17 0828 07/14/17 1710   07/01/17 1500  fluconazole (DIFLUCAN) IVPB 100 mg     100 mg 50 mL/hr over 60 Minutes Intravenous Every 24 hours 07/01/17 1401 07/07/17 1715  06/16/17 1200  piperacillin-tazobactam (ZOSYN) IVPB 3.375 g     3.375 g 12.5 mL/hr over 240 Minutes Intravenous Every 8 hours 06/16/17 1026 06/17/17 0004   06/08/17 1200  piperacillin-tazobactam (ZOSYN) IVPB 3.375 g  Status:  Discontinued     3.375 g 12.5 mL/hr over 240 Minutes Intravenous Every 8 hours 06/08/17 1056 06/16/17 1026   05/29/17 2200  ceFEPIme (MAXIPIME) 2 g in dextrose 5 % 50 mL IVPB  Status:  Discontinued     2 g 100 mL/hr over 30 Minutes Intravenous Every  8 hours 05/29/17 2031 06/03/17 0832   05/29/17 2100  metroNIDAZOLE (FLAGYL) IVPB 500 mg  Status:  Discontinued     500 mg 100 mL/hr over 60 Minutes Intravenous Every 8 hours 05/29/17 2029 06/03/17 0832   05/26/17 2200  ceFAZolin (ANCEF) IVPB 1 g/50 mL premix  Status:  Discontinued     1 g 100 mL/hr over 30 Minutes Intravenous Every 8 hours 05/26/17 1008 05/29/17 2023   05/25/17 1100  vancomycin (VANCOCIN) 1,250 mg in sodium chloride 0.9 % 250 mL IVPB  Status:  Discontinued     1,250 mg 166.7 mL/hr over 90 Minutes Intravenous Every 12 hours 05/25/17 0955 05/26/17 0946   05/22/17 1400  anidulafungin (ERAXIS) 100 mg in sodium chloride 0.9 % 100 mL IVPB     100 mg 78 mL/hr over 100 Minutes Intravenous Every 24 hours 05/21/17 1330 05/27/17 1740   05/21/17 1400  anidulafungin (ERAXIS) 200 mg in sodium chloride 0.9 % 200 mL IVPB     200 mg 78 mL/hr over 200 Minutes Intravenous  Once 05/21/17 1330 05/21/17 1940   05/15/17 1000  anidulafungin (ERAXIS) 100 mg in sodium chloride 0.9 % 100 mL IVPB  Status:  Discontinued     100 mg 78 mL/hr over 100 Minutes Intravenous Every 24 hours 05/14/17 0829 05/16/17 0901   05/15/17 1000  metroNIDAZOLE (FLAGYL) IVPB 500 mg  Status:  Discontinued     500 mg 100 mL/hr over 60 Minutes Intravenous Every 8 hours 05/15/17 0903 05/25/17 0938   05/14/17 0900  anidulafungin (ERAXIS) 200 mg in sodium chloride 0.9 % 200 mL IVPB     200 mg 78 mL/hr over 200 Minutes Intravenous  Once 05/14/17 0829 05/14/17 1325   05/13/17 1927  vancomycin (VANCOCIN) 1-5 GM/200ML-% IVPB    Comments:  Ward, Christa   : cabinet override      05/13/17 1927 05/14/17 0744   05/12/17 1400  ceFEPIme (MAXIPIME) 1 g in dextrose 5 % 50 mL IVPB     1 g 100 mL/hr over 30 Minutes Intravenous Every 8 hours 05/12/17 0939 05/26/17 2359   05/12/17 0900  vancomycin (VANCOCIN) IVPB 1000 mg/200 mL premix  Status:  Discontinued     1,000 mg 200 mL/hr over 60 Minutes Intravenous Every 12 hours 05/12/17 0750  05/16/17 0852   05/12/17 0830  aztreonam (AZACTAM) 2 GM IVPB     2 g 100 mL/hr over 30 Minutes Intravenous  Once 05/12/17 0800 05/12/17 0957   05/06/17 0916  vancomycin (VANCOCIN) IVPB 1000 mg/200 mL premix     1,000 mg 200 mL/hr over 60 Minutes Intravenous On call to O.R. 05/06/17 0916 05/06/17 1229      Assessment/Plan: s/p Procedure(s): EXPLORATORY LAPAROTOMY WITH CLOSURE OF ENTEROTOMICS (N/A) LAPAROSCOPY DIAGNOSTIC (N/A) LYSIS OF ADHESIONS (N/A) INSERTION OF MESH (N/A)  Neuro: scheduled tylenol suppositories, scheduled IV valium, and PCA and prn fentanyl. May have some desensitization to the fentanyl by this point. Add toradol.  Cardiac: tachycardia likely due to pain, hypertensive Pulmonary: aggressive pulmonary toilet, incentive spirometer, Sats 99% on nasal canula GI: continue NG suction, NPO, await return of bowel function Renal: continue foley.   Heme: acute blood loss anemia and anemia of chronic disease, down a bit, transfuse today.   ID: no antibiotics POD 2.  Think temp secondary to atelectasis given shallow breathing.  Work on IS more today.   Endocrine:SSI, stress related hyperglycemia/.TNA. FEN: TPN, NPO  Dispo: remain in ICU. Work on pain control. Await return of bowel function.    LOS: 88 days    [name] [name] 08/02/2017

## 2017-08-03 ENCOUNTER — Encounter (HOSPITAL_COMMUNITY): Payer: Self-pay | Admitting: Surgery

## 2017-08-03 LAB — TYPE AND SCREEN
ABO/RH(D): A NEG
Antibody Screen: NEGATIVE
UNIT DIVISION: 0
UNIT DIVISION: 0
Unit division: 0

## 2017-08-03 LAB — BASIC METABOLIC PANEL
Anion gap: 8 (ref 5–15)
BUN: 17 mg/dL (ref 6–20)
CO2: 22 mmol/L (ref 22–32)
CREATININE: 0.63 mg/dL (ref 0.61–1.24)
Calcium: 7.7 mg/dL — ABNORMAL LOW (ref 8.9–10.3)
Chloride: 106 mmol/L (ref 101–111)
GFR calc non Af Amer: >60 mL/min (ref >60)
Glucose, Bld: 139 mg/dL — ABNORMAL HIGH (ref 65–99)
POTASSIUM: 4.1 mmol/L (ref 3.5–5.1)
SODIUM: 136 mmol/L (ref 135–145)

## 2017-08-03 LAB — BPAM RBC
BLOOD PRODUCT EXPIRATION DATE: 201810232359
Blood Product Expiration Date: 201810212359
Blood Product Expiration Date: 201810232359
ISSUE DATE / TIME: 201810050728
ISSUE DATE / TIME: 201810071101
ISSUE DATE / TIME: 201810071324
UNIT TYPE AND RH: 600
Unit Type and Rh: 600
Unit Type and Rh: 600

## 2017-08-03 LAB — GLUCOSE, CAPILLARY
GLUCOSE-CAPILLARY: 76 mg/dL (ref 65–99)
Glucose-Capillary: 96 mg/dL (ref 65–99)
Glucose-Capillary: 138 mg/dL — ABNORMAL HIGH (ref 65–99)

## 2017-08-03 LAB — CBC
HCT: 25 % — ABNORMAL LOW (ref 39.0–52.0)
HEMOGLOBIN: 8.4 g/dL — AB (ref 13.0–17.0)
MCH: 25.7 pg — AB (ref 26.0–34.0)
MCHC: 33.6 g/dL (ref 30.0–36.0)
MCV: 76.5 fL — ABNORMAL LOW (ref 78.0–100.0)
PLATELETS: 406 10*3/uL — AB (ref 150–400)
RBC: 3.27 MIL/uL — AB (ref 4.22–5.81)
RDW: 17.2 % — ABNORMAL HIGH (ref 11.5–15.5)
WBC: 17.6 10*3/uL — AB (ref 4.0–10.5)

## 2017-08-03 LAB — TRIGLYCERIDES: TRIGLYCERIDES: 145 mg/dL (ref <150)

## 2017-08-03 LAB — PHOSPHORUS: Phosphorus: 5 mg/dL — ABNORMAL HIGH (ref 2.5–4.6)

## 2017-08-03 LAB — MAGNESIUM: Magnesium: 2.3 mg/dL (ref 1.7–2.4)

## 2017-08-03 LAB — PREALBUMIN

## 2017-08-03 MED ORDER — ACETAMINOPHEN 10 MG/ML IV SOLN
1000.0000 mg | Freq: Four times a day (QID) | INTRAVENOUS | Status: AC
  Administered 2017-08-03 – 2017-08-04 (×4): 1000 mg via INTRAVENOUS
  Filled 2017-08-03 (×5): qty 100

## 2017-08-03 MED ORDER — FAT EMULSION 20 % IV EMUL
240.0000 mL | INTRAVENOUS | Status: AC
  Administered 2017-08-03: 240 mL via INTRAVENOUS
  Filled 2017-08-03: qty 250

## 2017-08-03 MED ORDER — POTASSIUM CHLORIDE 10 MEQ/100ML IV SOLN
10.0000 meq | INTRAVENOUS | Status: AC
  Administered 2017-08-03 (×3): 10 meq via INTRAVENOUS
  Filled 2017-08-03 (×2): qty 100

## 2017-08-03 MED ORDER — MAGNESIUM SULFATE 4 GM/100ML IV SOLN
4.0000 g | Freq: Once | INTRAVENOUS | Status: AC
  Administered 2017-08-03: 4 g via INTRAVENOUS
  Filled 2017-08-03: qty 100

## 2017-08-03 MED ORDER — TRACE MINERALS CR-CU-MN-SE-ZN 10-1000-500-60 MCG/ML IV SOLN
INTRAVENOUS | Status: AC
  Administered 2017-08-03: 18:00:00 via INTRAVENOUS
  Filled 2017-08-03: qty 2000

## 2017-08-03 NOTE — Progress Notes (Signed)
Date:  August 03, 2017 Chart reviewed for concurrent status and case management needs.  Will continue to follow patient progress.  Discharge Planning: following for needs  Expected discharge date: [id number]  Rhonda Davis, BSN, RN3, CCM   [phone]  

## 2017-08-03 NOTE — Anesthesia Postprocedure Evaluation (Signed)
Anesthesia Post Note  Patient: [name] [name]  Procedure(s) Performed: EXPLORATORY LAPAROTOMY WITH CLOSURE OF ENTEROTOMICS (N/A Abdomen) LAPAROSCOPY DIAGNOSTIC (N/A Abdomen) LYSIS OF ADHESIONS (N/A Abdomen) INSERTION OF MESH (N/A Abdomen)     Patient location during evaluation: PACU Anesthesia Type: General Level of consciousness: awake and alert and confused (pt slightly confused, anxious, requiring sedation) Pain management: pain level controlled (pain improving) Vital Signs Assessment: post-procedure vital signs reviewed and stable Respiratory status: spontaneous breathing, nonlabored ventilation, respiratory function stable and patient connected to nasal cannula oxygen Cardiovascular status: blood pressure returned to baseline and stable Postop Assessment: no apparent nausea or vomiting Anesthetic complications: yes (pt is a little confused and anxious, improved with Precedex sedation)    Last Vitals:  Vitals:   08/03/17 1000 08/03/17 1159  BP: 134/81   Pulse: 96   Resp: (!) 26 (!) 22  Temp:    SpO2: 99% 100%    Last Pain:  Vitals:   08/03/17 1159  TempSrc:   PainSc: 5                  Suezette Lafave,E. Lajean Boese

## 2017-08-03 NOTE — Progress Notes (Signed)
Patient and his wife have been requesting frequent PRNs for pain, anxiety, and nausea. Patient appears comfortable, though he rates his pain 9/10. Patient and his wife are educated on the effects of receiving excessive sedating medications and the importance of assuring the patient can protect his own airway. The wife continues to request frequent pain med interventions and would like to suggest a "different combination of drugs." VSS. Will continue to monitor. Will continue to attempt non-pharm pain interventions.

## 2017-08-03 NOTE — Progress Notes (Signed)
   08/03/17 1400  Clinical Encounter Type  Visited With Patient and family together  Visit Type Follow-up  Spiritual Encounters  Spiritual Needs Emotional;Prayer   Following up with patient as I have been visiting with him for several weeks.  His sister was by his side.  He was kind of out of it, but recognized me and we had a short visit.  Will continue to follow.   Chaplain Katherene Ponto

## 2017-08-03 NOTE — Progress Notes (Signed)
Patient ID: [name] [name], male   DOB: 06-03-1961, 56 y.o.   MRN: [id number] [facility] Progress Note:   3 Days Post-Op  Subjective: Mental status is awakens and complains of pain;   Objective: Vital signs in last 24 hours: Temp:  [97.5 F (36.4 C)-100.1 F (37.8 C)] 99.5 F (37.5 C) (10/08 0800) Pulse Rate:  [69-106] 83 (10/08 0700) Resp:  [18-34] 26 (10/08 0800) BP: (105-139)/(56-86) 129/74 (10/08 0700) SpO2:  [97 %-100 %] 99 % (10/08 0800) FiO2 (%):  [97 %] 97 % (10/07 1534)  Intake/Output from previous day: 10/07 0701 - 10/08 0700 In: 3263.1 [I.V.:2194.1; Blood:805; IV Piggyback:264] Out: 3700 [Urine:3075; Emesis/NG output:625] Intake/Output this shift: No intake/output data recorded.  Physical Exam: Work of breathing is not labored.  Abdominal binder in place.    Lab Results:  Results for orders placed or performed during the hospital encounter of 05/06/17 (from the past 48 hour(s))  Glucose, capillary     Status: None   Collection Time: 08/01/17 11:41 AM  Result Value Ref Range   Glucose-Capillary 93 65 - 99 mg/dL   Comment 1 Notify RN    Comment 2 Document in Chart   Glucose, capillary     Status: Abnormal   Collection Time: 08/01/17  3:14 PM  Result Value Ref Range   Glucose-Capillary 115 (H) 65 - 99 mg/dL   Comment 1 Notify RN    Comment 2 Document in Chart   Glucose, capillary     Status: Abnormal   Collection Time: 08/01/17  7:51 PM  Result Value Ref Range   Glucose-Capillary 139 (H) 65 - 99 mg/dL   Comment 1 Notify RN    Comment 2 Document in Chart   Glucose, capillary     Status: Abnormal   Collection Time: 08/01/17 11:22 PM  Result Value Ref Range   Glucose-Capillary 139 (H) 65 - 99 mg/dL   Comment 1 Notify RN    Comment 2 Document in Chart   Glucose, capillary     Status: Abnormal   Collection Time: 08/02/17  3:26 AM  Result Value Ref Range   Glucose-Capillary 148 (H) 65 - 99 mg/dL   Comment 1 Notify RN    Comment 2 Document in Chart    Magnesium     Status: Abnormal   Collection Time: 08/02/17  5:31 AM  Result Value Ref Range   Magnesium 1.6 (L) 1.7 - 2.4 mg/dL  Phosphorus     Status: None   Collection Time: 08/02/17  5:31 AM  Result Value Ref Range   Phosphorus 3.3 2.5 - 4.6 mg/dL  CBC     Status: Abnormal   Collection Time: 08/02/17  5:31 AM  Result Value Ref Range   WBC 18.9 (H) 4.0 - 10.5 K/uL   RBC 2.58 (L) 4.22 - 5.81 MIL/uL   Hemoglobin 6.6 (LL) 13.0 - 17.0 g/dL    Comment: REPEATED TO VERIFY CRITICAL RESULT CALLED TO, READ BACK BY AND VERIFIED WITH: T BANKS AT 0631 ON 10.07.2018 BY NBROOKS    HCT 19.9 (L) 39.0 - 52.0 %   MCV 77.1 (L) 78.0 - 100.0 fL   MCH 25.6 (L) 26.0 - 34.0 pg   MCHC 33.2 30.0 - 36.0 g/dL   RDW 18.2 (H) 11.5 - 15.5 %   Platelets 406 (H) 150 - 400 K/uL  Basic metabolic panel     Status: Abnormal   Collection Time: 08/02/17  5:31 AM  Result Value Ref Range  Sodium 138 135 - 145 mmol/L   Potassium 4.2 3.5 - 5.1 mmol/L   Chloride 109 101 - 111 mmol/L   CO2 24 22 - 32 mmol/L   Glucose, Bld 110 (H) 65 - 99 mg/dL   BUN 20 6 - 20 mg/dL   Creatinine, Ser 0.60 (L) 0.61 - 1.24 mg/dL   Calcium 8.1 (L) 8.9 - 10.3 mg/dL   GFR calc non Af Amer >60 >60 mL/min   GFR calc Af Amer >60 >60 mL/min    Comment: (NOTE) The eGFR has been calculated using the CKD EPI equation. This calculation has not been validated in all clinical situations. eGFR's persistently <60 mL/min signify possible Chronic Kidney Disease.    Anion gap 5 5 - 15  Prepare RBC     Status: None   Collection Time: 08/02/17  7:34 AM  Result Value Ref Range   Order Confirmation ORDER PROCESSED BY BLOOD BANK   Glucose, capillary     Status: Abnormal   Collection Time: 08/02/17  7:47 AM  Result Value Ref Range   Glucose-Capillary 153 (H) 65 - 99 mg/dL  Glucose, capillary     Status: Abnormal   Collection Time: 08/02/17 11:39 AM  Result Value Ref Range   Glucose-Capillary 101 (H) 65 - 99 mg/dL  Glucose, capillary      Status: None   Collection Time: 08/02/17  3:12 PM  Result Value Ref Range   Glucose-Capillary 86 65 - 99 mg/dL  CBC     Status: Abnormal   Collection Time: 08/02/17  6:22 PM  Result Value Ref Range   WBC 17.6 (H) 4.0 - 10.5 K/uL   RBC 3.27 (L) 4.22 - 5.81 MIL/uL   Hemoglobin 8.4 (L) 13.0 - 17.0 g/dL    Comment: RESULT REPEATED AND VERIFIED POST TRANSFUSION SPECIMEN DELTA CHECK NOTED    HCT 25.0 (L) 39.0 - 52.0 %   MCV 76.5 (L) 78.0 - 100.0 fL   MCH 25.7 (L) 26.0 - 34.0 pg   MCHC 33.6 30.0 - 36.0 g/dL   RDW 17.2 (H) 11.5 - 15.5 %   Platelets 406 (H) 150 - 400 K/uL  Glucose, capillary     Status: Abnormal   Collection Time: 08/02/17 11:14 PM  Result Value Ref Range   Glucose-Capillary 108 (H) 65 - 99 mg/dL   Comment 1 Notify RN   Prealbumin     Status: Abnormal   Collection Time: 08/03/17  3:47 AM  Result Value Ref Range   Prealbumin <5.0 (L) 18 - 38 mg/dL    Comment: Performed at [facility], [address] [place], [place] [address]  Triglycerides     Status: None   Collection Time: 08/03/17  3:47 AM  Result Value Ref Range   Triglycerides 145 <150 mg/dL    Comment: Performed at [facility] [address], [place], [place] [address]  Magnesium     Status: None   Collection Time: 08/03/17  3:47 AM  Result Value Ref Range   Magnesium 2.3 1.7 - 2.4 mg/dL  Phosphorus     Status: Abnormal   Collection Time: 08/03/17  3:47 AM  Result Value Ref Range   Phosphorus 5.0 (H) 2.5 - 4.6 mg/dL  Basic metabolic panel     Status: Abnormal   Collection Time: 08/03/17  3:47 AM  Result Value Ref Range   Sodium 136 135 - 145 mmol/L   Potassium 4.1 3.5 - 5.1 mmol/L   Chloride 106 101 -  111 mmol/L   CO2 22 22 - 32 mmol/L   Glucose, Bld 139 (H) 65 - 99 mg/dL   BUN 17 6 - 20 mg/dL   Creatinine, Ser 0.63 0.61 - 1.24 mg/dL   Calcium 7.7 (L) 8.9 - 10.3 mg/dL   GFR calc non Af Amer >60 >60 mL/min   GFR calc Af Amer >60 >60 mL/min    Comment: (NOTE) The eGFR has been  calculated using the CKD EPI equation. This calculation has not been validated in all clinical situations. eGFR's persistently <60 mL/min signify possible Chronic Kidney Disease.    Anion gap 8 5 - 15    Radiology/Results: No results found.  Anti-infectives: Anti-infectives    Start     Dose/Rate Route Frequency Ordered Stop   08/01/17 0600  metroNIDAZOLE (FLAGYL) IVPB 500 mg  Status:  Discontinued     500 mg 100 mL/hr over 60 Minutes Intravenous On call to O.R. 07/31/17 1656 07/31/17 1709   08/01/17 0600  ciprofloxacin (CIPRO) IVPB 400 mg  Status:  Discontinued     400 mg 200 mL/hr over 60 Minutes Intravenous On call to O.R. 07/31/17 1656 07/31/17 1709   07/31/17 0556  metroNIDAZOLE (FLAGYL) IVPB 500 mg     500 mg 100 mL/hr over 60 Minutes Intravenous On call to O.R. 07/31/17 0556 07/31/17 0840   07/31/17 0556  ciprofloxacin (CIPRO) IVPB 400 mg     400 mg 200 mL/hr over 60 Minutes Intravenous On call to O.R. 07/31/17 0556 07/31/17 0914   07/08/17 1600  fluconazole (DIFLUCAN) IVPB 100 mg     100 mg 50 mL/hr over 60 Minutes Intravenous Every 24 hours 07/08/17 0828 07/14/17 1710   07/01/17 1500  fluconazole (DIFLUCAN) IVPB 100 mg     100 mg 50 mL/hr over 60 Minutes Intravenous Every 24 hours 07/01/17 1401 07/07/17 1715   06/16/17 1200  piperacillin-tazobactam (ZOSYN) IVPB 3.375 g     3.375 g 12.5 mL/hr over 240 Minutes Intravenous Every 8 hours 06/16/17 1026 06/17/17 0004   06/08/17 1200  piperacillin-tazobactam (ZOSYN) IVPB 3.375 g  Status:  Discontinued     3.375 g 12.5 mL/hr over 240 Minutes Intravenous Every 8 hours 06/08/17 1056 06/16/17 1026   05/29/17 2200  ceFEPIme (MAXIPIME) 2 g in dextrose 5 % 50 mL IVPB  Status:  Discontinued     2 g 100 mL/hr over 30 Minutes Intravenous Every 8 hours 05/29/17 2031 06/03/17 0832   05/29/17 2100  metroNIDAZOLE (FLAGYL) IVPB 500 mg  Status:  Discontinued     500 mg 100 mL/hr over 60 Minutes Intravenous Every 8 hours 05/29/17 2029  06/03/17 0832   05/26/17 2200  ceFAZolin (ANCEF) IVPB 1 g/50 mL premix  Status:  Discontinued     1 g 100 mL/hr over 30 Minutes Intravenous Every 8 hours 05/26/17 1008 05/29/17 2023   05/25/17 1100  vancomycin (VANCOCIN) 1,250 mg in sodium chloride 0.9 % 250 mL IVPB  Status:  Discontinued     1,250 mg 166.7 mL/hr over 90 Minutes Intravenous Every 12 hours 05/25/17 0955 05/26/17 0946   05/22/17 1400  anidulafungin (ERAXIS) 100 mg in sodium chloride 0.9 % 100 mL IVPB     100 mg 78 mL/hr over 100 Minutes Intravenous Every 24 hours 05/21/17 1330 05/27/17 1740   05/21/17 1400  anidulafungin (ERAXIS) 200 mg in sodium chloride 0.9 % 200 mL IVPB     200 mg 78 mL/hr over 200 Minutes Intravenous  Once 05/21/17 1330 05/21/17 1940  05/15/17 1000  anidulafungin (ERAXIS) 100 mg in sodium chloride 0.9 % 100 mL IVPB  Status:  Discontinued     100 mg 78 mL/hr over 100 Minutes Intravenous Every 24 hours 05/14/17 0829 05/16/17 0901   05/15/17 1000  metroNIDAZOLE (FLAGYL) IVPB 500 mg  Status:  Discontinued     500 mg 100 mL/hr over 60 Minutes Intravenous Every 8 hours 05/15/17 0903 05/25/17 0938   05/14/17 0900  anidulafungin (ERAXIS) 200 mg in sodium chloride 0.9 % 200 mL IVPB     200 mg 78 mL/hr over 200 Minutes Intravenous  Once 05/14/17 0829 05/14/17 1325   05/13/17 1927  vancomycin (VANCOCIN) 1-5 GM/200ML-% IVPB    Comments:  Ward, Christa   : cabinet override      05/13/17 1927 05/14/17 0744   05/12/17 1400  ceFEPIme (MAXIPIME) 1 g in dextrose 5 % 50 mL IVPB     1 g 100 mL/hr over 30 Minutes Intravenous Every 8 hours 05/12/17 0939 05/26/17 2359   05/12/17 0900  vancomycin (VANCOCIN) IVPB 1000 mg/200 mL premix  Status:  Discontinued     1,000 mg 200 mL/hr over 60 Minutes Intravenous Every 12 hours 05/12/17 0750 05/16/17 0852   05/12/17 0830  aztreonam (AZACTAM) 2 GM IVPB     2 g 100 mL/hr over 30 Minutes Intravenous  Once 05/12/17 0800 05/12/17 0957   05/06/17 0916  vancomycin (VANCOCIN) IVPB  1000 mg/200 mL premix     1,000 mg 200 mL/hr over 60 Minutes Intravenous On call to O.R. 05/06/17 0916 05/06/17 1229      Assessment/Plan: Problem List: Patient Active Problem List   Diagnosis Date Noted  . Hypomagnesemia 06/28/2017  . Hypothyroidism   . 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 Protein-calorie malnutrition, severe ([place]) 06/27/2017  . 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 Pressure injury of skin 05/29/2017  . Ileus ([place])   . Hypokalemia 05/14/2017  . Anastomotic leak of intestine 05/14/2017  . Wound dehiscence, surgical 05/14/2017  . Aspiration pneumonia ([place]) 05/14/2017  . Chronic narcotic use 05/10/2017  . Generalized anxiety disorder 05/10/2017  . Intra-abdominal adhesions s/p SB resection 05/06/2017 05/09/2017  . Throat symptom 03/18/2016  . Multinodular goiter 01/19/2016  . Hyperthyroidism 01/19/2016  . Essential hypertension   . Diarrhea 11/30/2014  . Anemia, iron deficiency 11/01/2014  . Leukocytosis 11/03/2013  . Tobacco abuse 07/26/2013  . COPD (chronic obstructive pulmonary disease) ([place]) 07/26/2013  . Left knee pain 07/26/2013  . Pain in joint, shoulder region 05/03/2013  . GERD 07/24/2008  . 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    Continue NPO with NG;  Will add IV Ofirmiv x 24 hrs to see if that will improve his pain management.   3 Days Post-Op    LOS: 89 days   Matt B. Hassell Done, MD, [facility] [phone] beeper [phone]  08/03/2017 9:03 AM

## 2017-08-03 NOTE — Progress Notes (Signed)
Nutrition Follow-up  DOCUMENTATION CODES:   Not applicable  INTERVENTION:  - Continue cyclic TPN regimen per Pharmacy. - Will continue to monitor for plan concerning nutrition.  NUTRITION DIAGNOSIS:   Inadequate oral intake related to inability to eat as evidenced by NPO status. -ongoing  GOAL:   Patient will meet greater than or equal to 90% of their needs -met with cyclic TPN regimen.   MONITOR:   Diet advancement, Weight trends, Labs, Skin, I & O's, Other (Comment) (TPN regimen)  ASSESSMENT:   56 y.o. 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 On 7/18, he developed a leak therefore was taken back to OR for re-do of ex-lap and repair of leak.  7/11 YT:RZNBV adrenalectomy with complication of intestinal injury and small bowel resection with anastomosis  7/18 AP:OLIDCV of small bowel anastomotic leak, abdominal closure of wound dehisence 7/19 OR: reopening of recent laparotomy, lysis of adhesions, noted ischemic perforation of new loop of intestine, intact repair of previous anastomotic leak repair, intact previous anastomosis.  7/22 OR: wash out, tube ileostomy, left with open abdomen &wound vac in place. Plan for OR on 7/25 for closure 7/25 OR: mesh placed for abdominal closure, wound vac 8/2extubated, propofol drip off 8/4TPN off for ~ 5 hours due to line detached from filter. Ultrasound-guided aspiration By interventional radiology of small perihepatic fluid collection yielded only 1 mL of the fluid, sent for culture. NPO - mild coffee ground on NG but no active bleed 8/6patient improving. Off Precedex drip since 8/4. Dilaudid Drip being changed over to PCA. Still with some coffee ground-looking material in NG tube.  8/10 hyponatremia and plan to decrease TPN from 3L/day to 2L/day x1  week 8/81fund to have fecal matter in wound vac (EC fistula) 8/14increase in fecal matter presence in wound vac with no plan for surgery at this time 8/16TPN advanced to goal rate of 115 ml/hr with 20% ILE @ 20 mL/hr x12 hours on MWF 8/22: NGT clamped. RD to order Boost Breeze for patient to sip on; Mycostatin started for oral thrush. 9/1:pulled NGT and no plans for replacement. 9/1:developed N/V and only permitted sips of clears, if tolerated. 9/4:NGT re-insertion attempted several times without success 9/7:begin cycling TPN 9/23: allowed to leave the floor to go outside 10/5: Laparoscopy, laparotomy, extensive enterolysis with removal of mesh (partially) in the right upper quadrant; resection of old anastomosis and ileotransverse anastomosis   10/8 Pt remains NPO. NGT in place with 400cc light brown output with ~150cc of this being from this shift. Pt very drowsy and mumbles words; no family/visitors present. Weight stable 10/2-10/4 and then +10 lbs/4.4 kg from 10/4-10/6 (likely from surgery). Cyclic TPN regimen was changed to be over 16 hours on 10/3. Spoke with Pharmacist this AM and she reports plan to continue current cyclic regimen but remove lytes so regimen will be: Clinimix 5/15 over 16 hours for 3L with 20% ILE @ 20 mL/hr x12 hours on MWF. This regimen provides a daily average of 150 grams of protein 2336 kcal. Plans to empirically order K and Mg runs as these tend to drop low for this pt when lytes are removed from his TPN.  BP: 134/81 and MAP: 96.  Medications reviewed; sliding scale Novolog, 4 g IV Mg sulfate x1 run today, 4 g IV Mg sulfate x3 runs yesterday, 10 mg IV  Reglan TID, 40 mg IV Protonix BID, 10 mEq IV KCl x3 runs today and x2 runs yesterday. Labs reviewed; Ca: 7.7 mg/dL, Phos: 5 mg/dL.    10/2 - Pt remains NPO with cyclic TPN regimen.  - Spoke with Pharmacist concerning prealbumin; adjusted estimated needs in light of this and continued plan for surgery on  Friday (10/5) and wanting to maximizing nutrition prior to surgery.  - Pharmacist plans to make adjustments tomorrow for TPN (will likely maintain rate but increase from 14 hours to 16 hours/day).  - Weight continues to fluctuate; will monitor closely, especially with increases to TPN volume.  4 g IV Mg sulfate x2 runs today K: 3.3 mmol/L   9/27 - Pt remains NPO with cyclic TPN as outlined below.  - Pt with some abdominal pain on Tuesday which was improved with wound vac change yesterday.  - Weight trending up since 9/24 and is currently +6 lbs/2.6 kg since that date.  - Plan remains for OR on 10/5 with Dr. Hassell Done.   Diet Order:  Diet NPO time specified TPN (CLINIMIX) Adult without lytes  Skin:  Wound (see comment) (Incisions to R flank and abdomen from 7/11 and 7/18)  Last BM:  10/1 (through EC fistula)  Height:   Ht Readings from Last 1 Encounters:  05/26/17 '5\' 10"'  (1.778 m)    Weight:   Wt Readings from Last 1 Encounters:  08/01/17 185 lb 3 oz (84 kg)    Ideal Body Weight:  75.45 kg  BMI:  Body mass index is 26.57 kg/m.  Estimated Nutritional Needs:   Kcal:  2030-2270 (25-28 kcal/kg)  Protein:  137-153 grams (1.7-1.9 grams/kg)  Fluid:  2 L/day  EDUCATION NEEDS:   No education needs identified at this time    [name] Matin, MS, RD, LDN, CNSC Inpatient Clinical Dietitian Pager # [phone] After hours/weekend pager # [phone]

## 2017-08-03 NOTE — Progress Notes (Signed)
[place] NOTE   Pharmacy Consult for TPN Indication: prolonged ileus, small bowel leak, multiple bowel surgeries  Patient Measurements: Body mass index is 26.57 kg/m. Filed Weights   07/29/17 0451 07/30/17 1352 08/01/17 0500  Weight: 175 lb 11.3 oz (79.7 kg) 175 lb 7.8 oz (79.6 kg) 185 lb 3 oz (84 kg)   HPI: 25 yoM admitted on 7/11 for enlarging adrenal mass and planned adrenalectomy.  Pharmacy consulted to dose TPN.  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   7/22 OR:  wash out, tube ileostomy, left with open abdomen & wound vac in place.  Plan for OR on 7/25 for closure 7/25 OR: mesh placed for abdominal closure, wound vac 8/2 extubated, propofol drip off 8/4 TPN off for ~ 5 hours due to line detached from filter 8/10 MD request decrease rate x 1 week 2nd low sodium 8/12 Additional sodium added to TPN due to persistent hyponatremia 8/16 Increasing rate back to goal since sodium has improved with above intervention.   8/20 Prealbumin decreased.  New apparent enteric leak per surgery notes. 8/21 CaPhos product elevated, remove electrolytes from TPN 8/25 PICC did not have blood return in 2 of the 3 ports.  Alteplase intra-catheter and function returned.  TPN infusing correctly on 8/26 per RN. 8/28: d/c SSI 8/30: added electrolytes to TPN 8/31: TPN IV line came out around ~0340 and current bag was stopped at this time.  Restart new clinimix 5/15 1L bag (no lytes since phos is elevated this morning) until next bag is due at [facility]. Will not add MVI and trace elements to this 1L bag.  Use electrolyte free clinimix with new bag at [facility] 9/1 NGT out 9/3 added electrolytes to TPN 9/4 lytes out of TPN 9/7 cycle over 18 hrs 9/8 add back electrolytes to TPN.  Cycle over 14 hours 9/9 lytes out of TPN, continue 14 hr cycle, DC SSI/CBGs 9/13 electrolytes added back to TPN 9/14 remove electrolytes from TPN 10/3 Adjusted estimated needs based on prealbumin (decreased on 9/24, low but slightly improved on 10/1) and attempt to maximize nutrition prior to surgery planned for 07/31/17 - required increase of cycle to 16 hours. 10/5 to OR for fistula-repair surgery 10/6: Lytes added back to TPN with low Phos  Insulin requirements past 24 hours: 5 units in 24 hrs (CCS added SSI)  Current Nutrition: NPO  IVF: none.  Central access: PICC line ordered 7/19, placed on 7/20 TPN start date: 7/20  ASSESSMENT                                                                                                           Today, 08/03/17  - Glucose:  received Decadron perioperatively 10/5  and had SBG > 150, so CCS added SSI. Subsequent CBG/SBGs all at goal (100-150) - Electrolytes:  K 4.1 (goal > 4 )  Mag up 2.3 with aggressive repletion (goal >2)   Phos up 5 with lytes added to TPN for two days on 10/6 and 10/7  Sodium normalized and stable with additional sodium in TPN.  Corr Ca 8.98; Ca/phos product < 55 - Renal:  SCr wnl, stable - LFTs:  WNL except Alk Phos which is slightly elevated but relatively stable - TGs:  Slightly elevated prior to starting TPN, 145 on 10/8 - Prealbumin: Low on admission but trended up and remained at or near goal during Aug   to Sep; now slightly below goal but improving  NUTRITIONAL GOALS                                                                                             RD recs (10/2):  137-153 gms Protein (1.7 - 1.9 g/kg) 2030 - 2270 Kcal  Recalculation (10/3): Clinimix 5/15 3000 mL over 16h cycle with lipids 3x/week on MWF only would provide: 150 g  protein and avg 2335 kcal/day  Glucose infusion rate will be 6.5 mg/kg/min at max cyclic rate. (Maximum 7 mg/kg/min)  Electrolyte goals:  K >/= 4.0; Mg >/= 2.0 with ileus  PLAN                                                                                                                           KCl 10 mEq IV x 3  Mag 4g IV x1  At 1800 today:  Continue Clinimix 5/15 (electrolytes free) for total of 3000 mL/day to cycle over 16 hr thus:  50 ml/hr x 1 hr (6p-7p), 207 ml/hr x 12 hr (7p-9a), 50 ml/hr x 1 hr (9a-10a), then off  Continue sodium 125 mEq/L in TPN: add as 70 mEq/L NaCl and 55 mEq/L Na Acetate   Lipids at 34ms/hr x 12 hours 3 times weekly on MWF  Standard MVI, Trace elements, and pepcid 40 mg/day in TPN  Mod SSI per CCS - will change CBG checks to q6 hrs as patient appears relatively well controlled  TPN lab panels on Mondays & Thursdays.  BMET, Mag & Phos daily ordered.  ADia Sitter PharmD, BCPS 08/03/2017 8:05 AM

## 2017-08-03 NOTE — Progress Notes (Signed)
3 Days Post-Op    CC:  Continues to complain of pain  Subjective: Pt awake, speech slurred, NG in place, thick white oral secretions - On magic mouth wash Has not been OOB, complaining of nausea.    Objective: Vital signs in last 24 hours: Temp:  [97.5 F (36.4 C)-100.1 F (37.8 C)] 98.8 F (37.1 C) (10/08 0349) Pulse Rate:  [69-112] 83 (10/08 0700) Resp:  [18-34] 25 (10/08 0700) BP: (105-139)/(56-86) 129/74 (10/08 0700) SpO2:  [97 %-100 %] 100 % (10/08 0700) FiO2 (%):  [97 %] 97 % (10/07 1534) Last BM Date: 07/31/17 (ileostomy tubes) 3263 IV 3075 urine 625/NG Afebrile, VSS, TM 100.6 08/01/17, 102.5 post op 07/31/17 AM labs today so far:  Glucose 139/Phos 5.0 Other labs pending No films this AM Pain:Tylenol Supp x 4  Valium x 1  Fentanyl patch 125 mcg  Fentanyl bolus 100 mcg x 12  Fentanyl PCA 810 mcg  Ativan 2 mg x 5  Robaxin 1gm x 3  Nicoderm patch 14 mg daily  Intake/Output from previous day: 10/07 0701 - 10/08 0700 In: 3263.1 [I.V.:2194.1; Blood:805; IV Piggyback:264] Out: 2671 [Urine:3075; Emesis/NG output:625] Intake/Output this shift: No intake/output data recorded.  General appearance: Drowsy, with slurred speech, but awake and asking for more for pain and the  nausea Resp: some mild anterior chest wheezing, mostly upper air way GI: Picture is below.  Dressings were soaked and wet.  NO BS, NO flatus, no BM      Lab Results:   Recent Labs  08/02/17 0531 08/02/17 1822  WBC 18.9* 17.6*  HGB 6.6* 8.4*  HCT 19.9* 25.0*  PLT 406* 406*    BMET  Recent Labs  08/02/17 0531 08/03/17 0347  NA 138 136  K 4.2 4.1  CL 109 106  CO2 24 22  GLUCOSE 110* 139*  BUN 20 17  CREATININE 0.60* 0.63  CALCIUM 8.1* 7.7*   PT/INR No results for input(s): LABPROT, INR in the last 72 hours.   Recent Labs Lab 07/27/17 1128 07/28/17 0349 07/29/17 0812 07/30/17 0443 07/31/17 0700  AST 27 22 19 18 21   ALT 21 18 18 17 18   ALKPHOS 150* 133* 132* 137* 142*    BILITOT 0.5 0.4 0.5 0.4 0.5  PROT 6.6 5.8* 6.3* 6.0* 6.4*  ALBUMIN 2.4* 2.1* 2.3* 2.2* 2.4*     Lipase     Component Value Date/Time   LIPASE 27 09/15/2015 0934     Medications: . acetaminophen  650 mg Rectal Q6H  . Chlorhexidine Gluconate Cloth  6 each Topical Daily  . diazepam  5 mg Intravenous Q8H  . enoxaparin (LOVENOX) injection  30 mg Subcutaneous Q24H  . fentaNYL  175 mcg Transdermal Q72H  . fentaNYL   Intravenous Q4H  . insulin aspart  0-15 Units Subcutaneous Q6H  . lidocaine  2 patch Transdermal Q24H  . magic mouthwash  5 mL Oral TID AC & HS  . metoCLOPramide (REGLAN) injection  10 mg Intravenous Q8H  . nicotine  14 mg Transdermal Daily  . OLANZapine zydis  5 mg Oral QHS  . pantoprazole (PROTONIX) IV  40 mg Intravenous BID   Anti-infectives    Start     Dose/Rate Route Frequency Ordered Stop   08/01/17 0600  metroNIDAZOLE (FLAGYL) IVPB 500 mg  Status:  Discontinued     500 mg 100 mL/hr over 60 Minutes Intravenous On call to O.R. 07/31/17 1656 07/31/17 1709   08/01/17 0600  ciprofloxacin (CIPRO) IVPB 400 mg  Status:  Discontinued     400 mg 200 mL/hr over 60 Minutes Intravenous On call to O.R. 07/31/17 1656 07/31/17 1709   07/31/17 0556  metroNIDAZOLE (FLAGYL) IVPB 500 mg     500 mg 100 mL/hr over 60 Minutes Intravenous On call to O.R. 07/31/17 0556 07/31/17 0840   07/31/17 0556  ciprofloxacin (CIPRO) IVPB 400 mg     400 mg 200 mL/hr over 60 Minutes Intravenous On call to O.R. 07/31/17 0556 07/31/17 0914   07/08/17 1600  fluconazole (DIFLUCAN) IVPB 100 mg     100 mg 50 mL/hr over 60 Minutes Intravenous Every 24 hours 07/08/17 0828 07/14/17 1710   07/01/17 1500  fluconazole (DIFLUCAN) IVPB 100 mg     100 mg 50 mL/hr over 60 Minutes Intravenous Every 24 hours 07/01/17 1401 07/07/17 1715   06/16/17 1200  piperacillin-tazobactam (ZOSYN) IVPB 3.375 g     3.375 g 12.5 mL/hr over 240 Minutes Intravenous Every 8 hours 06/16/17 1026 06/17/17 0004   06/08/17 1200   piperacillin-tazobactam (ZOSYN) IVPB 3.375 g  Status:  Discontinued     3.375 g 12.5 mL/hr over 240 Minutes Intravenous Every 8 hours 06/08/17 1056 06/16/17 1026   05/29/17 2200  ceFEPIme (MAXIPIME) 2 g in dextrose 5 % 50 mL IVPB  Status:  Discontinued     2 g 100 mL/hr over 30 Minutes Intravenous Every 8 hours 05/29/17 2031 06/03/17 0832   05/29/17 2100  metroNIDAZOLE (FLAGYL) IVPB 500 mg  Status:  Discontinued     500 mg 100 mL/hr over 60 Minutes Intravenous Every 8 hours 05/29/17 2029 06/03/17 0832   05/26/17 2200  ceFAZolin (ANCEF) IVPB 1 g/50 mL premix  Status:  Discontinued     1 g 100 mL/hr over 30 Minutes Intravenous Every 8 hours 05/26/17 1008 05/29/17 2023   05/25/17 1100  vancomycin (VANCOCIN) 1,250 mg in sodium chloride 0.9 % 250 mL IVPB  Status:  Discontinued     1,250 mg 166.7 mL/hr over 90 Minutes Intravenous Every 12 hours 05/25/17 0955 05/26/17 0946   05/22/17 1400  anidulafungin (ERAXIS) 100 mg in sodium chloride 0.9 % 100 mL IVPB     100 mg 78 mL/hr over 100 Minutes Intravenous Every 24 hours 05/21/17 1330 05/27/17 1740   05/21/17 1400  anidulafungin (ERAXIS) 200 mg in sodium chloride 0.9 % 200 mL IVPB     200 mg 78 mL/hr over 200 Minutes Intravenous  Once 05/21/17 1330 05/21/17 1940   05/15/17 1000  anidulafungin (ERAXIS) 100 mg in sodium chloride 0.9 % 100 mL IVPB  Status:  Discontinued     100 mg 78 mL/hr over 100 Minutes Intravenous Every 24 hours 05/14/17 0829 05/16/17 0901   05/15/17 1000  metroNIDAZOLE (FLAGYL) IVPB 500 mg  Status:  Discontinued     500 mg 100 mL/hr over 60 Minutes Intravenous Every 8 hours 05/15/17 0903 05/25/17 0938   05/14/17 0900  anidulafungin (ERAXIS) 200 mg in sodium chloride 0.9 % 200 mL IVPB     200 mg 78 mL/hr over 200 Minutes Intravenous  Once 05/14/17 0829 05/14/17 1325   05/13/17 1927  vancomycin (VANCOCIN) 1-5 GM/200ML-% IVPB    Comments:  Ward, Christa   : cabinet override      05/13/17 1927 05/14/17 0744   05/12/17 1400   ceFEPIme (MAXIPIME) 1 g in dextrose 5 % 50 mL IVPB     1 g 100 mL/hr over 30 Minutes Intravenous Every 8 hours 05/12/17 0939 05/26/17 2359   05/12/17  0900  vancomycin (VANCOCIN) IVPB 1000 mg/200 mL premix  Status:  Discontinued     1,000 mg 200 mL/hr over 60 Minutes Intravenous Every 12 hours 05/12/17 0750 05/16/17 0852   05/12/17 0830  aztreonam (AZACTAM) 2 GM IVPB     2 g 100 mL/hr over 30 Minutes Intravenous  Once 05/12/17 0800 05/12/17 0957   05/06/17 0916  vancomycin (VANCOCIN) IVPB 1000 mg/200 mL premix     1,000 mg 200 mL/hr over 60 Minutes Intravenous On call to O.R. 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 S/p laparoscopic converted to open right adrenalectomy, small bowel resection with anastomosis, placement of 20cm^2 wound vac, 05/06/17, Dr. Lurena Joiner [name] 2. Post op wound dehiscence - primary repair of small bowel anastomotic leak, abdominal closure of wound dehisence, 05/13/17, Luke [name] 3. S/p reopening of recent laparotomy, lysis of adhesions, small bowel resection, placement 200cm^2 negative pressure dressing, 05/14/17, Luke [name] 4. S/p reopening of recent laparotomy, creation of tube ileostomy, placement of 100cm2 negative pressure dressing, 05/17/17, Luke [name] 5. S/p abdominal wash out, placement of mesh for abdominal closure, placement of wound vac of 100cm^2, 05/20/17, Luke [name] 6. S/p Bronchoscopy 05/22/17 Dr. Simonne Maffucci 7.   Laparoscopy, laparotomy, extensive enterolysis with removal of mesh (partially) in the right upper quadrant; resection of old anastomosis and ileotransverse anastomosis, 07/31/17,Dr.Jovoni Borkenhagen Hassell Done EC fistula 06/13/17 - noted on exam Sepsis -  Acute respiratory failure/ post op HCAP pneumonia/post op pleural effusion - CCM/extubated 05/28/17 Malnutrition - PICC/TNA 05/14/17 Hx of hypertension Hx of anemia Hx of anxiety Hx of hypothyroid Chronic  pain medicine use - neck pain Tobacco use FEN: NPO/ice chips/cyclic TNA - started 84/1/32 - pharmacy adjusting electrolytes/Phos ID: Vancomycin 7/11- 05/25/17, Maixpime 7/17-7/31/18, Eraxis 7/19-05/27/17, Diflucan 9/5-9/14, Cipro/ Flagyl pre-op 07/31/17 DVT: Lovenox  Plan:  Dressing changes BID, OOB to chair today, I told him and his wife I would not escalate his pain medicines further.  I will defer to DR. Hassell Done.        LOS: 89 days    JENNINGS,WILLARD 08/03/2017 [phone] \

## 2017-08-04 LAB — MAGNESIUM: Magnesium: 1.4 mg/dL — ABNORMAL LOW (ref 1.7–2.4)

## 2017-08-04 LAB — BASIC METABOLIC PANEL
Anion gap: 9 (ref 5–15)
BUN: 16 mg/dL (ref 6–20)
CHLORIDE: 106 mmol/L (ref 101–111)
CO2: 20 mmol/L — ABNORMAL LOW (ref 22–32)
CREATININE: 0.51 mg/dL — AB (ref 0.61–1.24)
Calcium: 8.1 mg/dL — ABNORMAL LOW (ref 8.9–10.3)
GFR calc Af Amer: >60 mL/min (ref >60)
GLUCOSE: 135 mg/dL — AB (ref 65–99)
POTASSIUM: 3.8 mmol/L (ref 3.5–5.1)
SODIUM: 135 mmol/L (ref 135–145)

## 2017-08-04 LAB — GLUCOSE, CAPILLARY
GLUCOSE-CAPILLARY: 104 mg/dL — AB (ref 65–99)
GLUCOSE-CAPILLARY: 88 mg/dL (ref 65–99)
GLUCOSE-CAPILLARY: 124 mg/dL — AB (ref 65–99)
Glucose-Capillary: 139 mg/dL — ABNORMAL HIGH (ref 65–99)

## 2017-08-04 LAB — PHOSPHORUS: PHOSPHORUS: 4.8 mg/dL — AB (ref 2.5–4.6)

## 2017-08-04 MED ORDER — TRACE MINERALS CR-CU-MN-SE-ZN 10-1000-500-60 MCG/ML IV SOLN
INTRAVENOUS | Status: AC
  Administered 2017-08-04: 18:00:00 via INTRAVENOUS
  Filled 2017-08-04: qty 2000
  Filled 2017-08-04: qty 3000

## 2017-08-04 MED ORDER — FENTANYL CITRATE (PF) 100 MCG/2ML IJ SOLN
100.0000 ug | INTRAMUSCULAR | Status: DC | PRN
Start: 2017-08-04 — End: 2017-08-05
  Administered 2017-08-04 – 2017-08-05 (×10): 100 ug via INTRAVENOUS
  Filled 2017-08-04 (×9): qty 2

## 2017-08-04 MED ORDER — POTASSIUM CHLORIDE 10 MEQ/50ML IV SOLN
10.0000 meq | INTRAVENOUS | Status: AC
  Administered 2017-08-04 (×6): 10 meq via INTRAVENOUS
  Filled 2017-08-04 (×6): qty 50

## 2017-08-04 MED ORDER — ACETAMINOPHEN 10 MG/ML IV SOLN
1000.0000 mg | Freq: Four times a day (QID) | INTRAVENOUS | Status: DC
  Filled 2017-08-04: qty 100

## 2017-08-04 MED ORDER — INSULIN ASPART 100 UNIT/ML ~~LOC~~ SOLN
0.0000 [IU] | Freq: Three times a day (TID) | SUBCUTANEOUS | Status: DC
  Administered 2017-08-04 – 2017-08-05 (×2): 2 [IU] via SUBCUTANEOUS

## 2017-08-04 MED ORDER — MAGNESIUM SULFATE 4 GM/100ML IV SOLN
4.0000 g | INTRAVENOUS | Status: AC
  Administered 2017-08-04 (×3): 4 g via INTRAVENOUS
  Filled 2017-08-04 (×3): qty 100

## 2017-08-04 MED ORDER — ACETAMINOPHEN 10 MG/ML IV SOLN
1000.0000 mg | Freq: Four times a day (QID) | INTRAVENOUS | Status: AC
  Administered 2017-08-04 – 2017-08-05 (×4): 1000 mg via INTRAVENOUS
  Filled 2017-08-04 (×4): qty 100

## 2017-08-04 MED ORDER — PROMETHAZINE HCL 25 MG/ML IJ SOLN
12.5000 mg | INTRAMUSCULAR | Status: DC | PRN
  Administered 2017-08-04 – 2017-08-26 (×31): 12.5 mg via INTRAVENOUS
  Filled 2017-08-04 (×31): qty 1

## 2017-08-04 NOTE — Progress Notes (Signed)
[facility] NOTE   Pharmacy Consult for TPN Indication: prolonged ileus, small bowel leak, multiple bowel surgeries  Patient Measurements: Body mass index is 26.54 kg/m. Filed Weights   07/30/17 1352 08/01/17 0500 08/04/17 0410  Weight: 175 lb 7.8 oz (79.6 kg) 185 lb 3 oz (84 kg) 184 lb 15.5 oz (83.9 kg)   HPI: [name] [name] admitted on 7/11 for enlarging adrenal mass and planned adrenalectomy.  Pharmacy consulted to dose TPN.  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   7/22 OR:  wash out, tube ileostomy, left with open abdomen & wound vac in place.  Plan for OR on 7/25 for closure 7/25 OR: mesh placed for abdominal closure, wound vac 8/2 extubated, propofol drip off 8/4 TPN off for ~ 5 hours due to line detached from filter 8/10 MD request decrease rate x 1 week 2nd low sodium 8/12 Additional sodium added to TPN due to persistent hyponatremia 8/16 Increasing rate back to goal since sodium has improved with above intervention.   8/20 Prealbumin decreased.  New apparent enteric leak per surgery notes. 8/21 CaPhos product elevated, remove electrolytes from TPN 8/25 PICC did not have blood return in 2 of the 3 ports.  Alteplase intra-catheter and function returned.  TPN infusing correctly on 8/26 per RN. 8/28: d/c SSI 8/30: added electrolytes to TPN 8/31: TPN IV line came out around ~0340 and current bag was stopped at this time.  Restart new clinimix 5/15 1L bag (no lytes since phos is elevated this morning) until next bag is due at [facility]. Will not add MVI and trace elements to this 1L bag.  Use electrolyte free clinimix with new bag at [facility] 9/1 NGT out 9/3 added electrolytes to TPN 9/4 lytes out of TPN 9/7 cycle over 18 hrs 9/8 add back electrolytes to TPN.  Cycle over 14 hours 9/9 lytes out of TPN, continue 14 hr cycle, DC SSI/CBGs 9/13 electrolytes added back to TPN 9/14 remove electrolytes from TPN 10/3 Adjusted estimated needs based on prealbumin (decreased on 9/24, low but slightly improved on 10/1) and attempt to maximize nutrition prior to surgery planned for 07/31/17 - required increase of cycle to 16 hours. 10/5 to OR for fistula-repair surgery 10/6: Lytes added back to TPN with low Phos  Insulin requirements past 24 hours: 4 units in 24 hrs (moderate SSI)   Current Nutrition: NPO  IVF: none  Central access: PICC line ordered 7/19, placed on 7/20 TPN start date: 7/20  ASSESSMENT                                                                                                           Today, 08/04/17  - Glucose:  (goal 100-150) Decadron perioperatively  10/5 and had SBG > 150, so CCS added SSI. Subsequent CBG < 150.  Two low CBGs 76 and 96 on 10/8 while TPN was off. - Electrolytes:  K 3.8 (goal > 4 )  Mag 1.4 (goal >2) after reducing replacement when levels previously improved.  Phos 4.8, elevated but improved with lytes removed from TPN (were added back into TPN on 10/6 and 10/7)  Sodium normalized but drifting down despite additional sodium in TPN.  Corr Ca 9.38; Ca/phos product 45 (goal < 55) - Renal:  SCr wnl, stable - LFTs:  WNL except Alk Phos which is slightly elevated but relatively stable - TGs:  Slightly elevated prior to starting TPN, 145 on 10/8 - Prealbumin: Low on admission but trended up and remained at or near goal during Aug to Sep; now decreased to < 5 (10/8).    NUTRITIONAL GOALS                                                                                             RD recs (10/8):  137-153 gms Protein (1.7 - 1.9 g/kg) 2030 - 2270  Kcal (25-28 kcal/kg)  Recalculation (10/3): Clinimix 5/15 3000 mL over 16h cycle with lipids 3x/week on MWF only would provide: 150 g protein and avg 2335 kcal/day  Glucose infusion rate will be 6.5 mg/kg/min at max cyclic rate. (Maximum 7 mg/kg/min)  Electrolyte goals:  K >/= 4.0; Mg >/= 2.0 with ileus  PLAN                                                                                                                         Now:  KCl 10 mEq IV x 6  Mag 4g IV x3  At 1800 today:  Continue Clinimix 5/15 (electrolyte free) 3000 mL/day cycled over 16 hr  50 ml/hr x 1 hr (6p-7p), 207 ml/hr x 12 hr (7p-9a), 50 ml/hr x 1 hr (9a-10a), then off  Continue sodium 125 mEq/L in TPN: add as 70 mEq/L NaCl and 55 mEq/L Na Acetate   Lipids at 20 mls/hr x 12 hours 3 times weekly on MWF  Standard MVI, Trace elements, and pepcid 40 mg/day in TPN  Mod SSI per CCS - decrease to q8h CBG checks and SSI.  Consider d/c if pt remains well controlled.   TPN lab panels on Mondays & Thursdays.   BMET, Mag & Phos daily ordered.  Gretta Arab PharmD, BCPS Pager [phone] 08/04/2017 7:34 AM

## 2017-08-04 NOTE — Progress Notes (Signed)
Pt sat up in bed. Reported diffuse abd pain rated 7/10 in severity. Encouraged splinting abdominal incisions with pillow when moving in bed. Re-educated on benefits of early mobilization and use of IS. Will continue to continue pt to use IS and move in bed.

## 2017-08-04 NOTE — Progress Notes (Signed)
4 Days Post-Op    CC: Adrenal mass with post op bowel dehiscence   Subjective: He is more awake today, speech is still a bit thick, but a little better than yesterday.  Did not get OOB yesterday "to painful."  He did have 3 BM's he says were soft.  I told him when he got up and did well we would try clamping his NG and see how he does.  Wounds are a bit soupy so I am going to increase dressing changes to TID.    Objective: Vital signs in last 24 hours: Temp:  [98 F (36.7 C)-100.5 F (38.1 C)] 98 F (36.7 C) (10/09 0326) Pulse Rate:  [75-101] 75 (10/09 0600) Resp:  [17-33] 24 (10/09 0600) BP: (115-153)/(63-87) 125/70 (10/09 0600) SpO2:  [96 %-100 %] 100 % (10/09 0600) FiO2 (%):  [97 %] 97 % (10/08 2357) Weight:  [83.9 kg (184 lb 15.5 oz)] 83.9 kg (184 lb 15.5 oz) (10/09 0410) Last BM Date: 07/31/17 (ileostomy tubes) NPO IB 1224 Urine 2250 NG 2100 BM x 3 TM 100.5 VSS Phos 4.8 - pharmacy adjusting NO films    Intake/Output from previous day: 10/08 0701 - 10/09 0700 In: 1225.2 [I.V.:625.2; NG/GT:40; IV Piggyback:560] Out: 1740 [Urine:2250; Emesis/NG output:2100] Intake/Output this shift: No intake/output data recorded.  General appearance: alert, cooperative and clearer this AM, speech is still a bit thick but better. Resp: clear to auscultation bilaterally Cardio: regular rate and rhythm, S1, S2 normal, no murmur, click, rub or gallop GI: Open wound is OK, soupy, but clean.  sutures are all intact.  Rare BS.  + Bm reported x 3 yesterday.   Lab Results:   Recent Labs  08/02/17 0531 08/02/17 1822  WBC 18.9* 17.6*  HGB 6.6* 8.4*  HCT 19.9* 25.0*  PLT 406* 406*    BMET  Recent Labs  08/03/17 0347 08/04/17 0408  NA 136 135  K 4.1 3.8  CL 106 106  CO2 22 20*  GLUCOSE 139* 135*  BUN 17 16  CREATININE 0.63 0.51*  CALCIUM 7.7* 8.1*   PT/INR No results for input(s): LABPROT, INR in the last 72 hours.   Recent Labs Lab 07/29/17 0812 07/30/17 0443  07/31/17 0700  AST 19 18 21   ALT 18 17 18   ALKPHOS 132* 137* 142*  BILITOT 0.5 0.4 0.5  PROT 6.3* 6.0* 6.4*  ALBUMIN 2.3* 2.2* 2.4*     Lipase     Component Value Date/Time   LIPASE 27 09/15/2015 0934     Medications: . Chlorhexidine Gluconate Cloth  6 each Topical Daily  . diazepam  5 mg Intravenous Q8H  . enoxaparin (LOVENOX) injection  30 mg Subcutaneous Q24H  . fentaNYL  175 mcg Transdermal Q72H  . fentaNYL   Intravenous Q4H  . insulin aspart  0-15 Units Subcutaneous Q6H  . lidocaine  2 patch Transdermal Q24H  . magic mouthwash  5 mL Oral TID AC & HS  . metoCLOPramide (REGLAN) injection  10 mg Intravenous Q8H  . nicotine  14 mg Transdermal Daily  . OLANZapine zydis  5 mg Oral QHS  . pantoprazole (PROTONIX) IV  40 mg Intravenous BID   . methocarbamol (ROBAXIN)  IV Stopped (08/04/17 0257)  . ondansetron (ZOFRAN) IV Stopped (08/02/17 2340)  . TPN (CLINIMIX) Adult without lytes 50 mL/hr at 08/03/17 1800   Anti-infectives    Start     Dose/Rate Route Frequency Ordered Stop   08/01/17 0600  metroNIDAZOLE (FLAGYL) IVPB 500 mg  Status:  Discontinued     500 mg 100 mL/hr over 60 Minutes Intravenous On call to O.R. 07/31/17 1656 07/31/17 1709   08/01/17 0600  ciprofloxacin (CIPRO) IVPB 400 mg  Status:  Discontinued     400 mg 200 mL/hr over 60 Minutes Intravenous On call to O.R. 07/31/17 1656 07/31/17 1709   07/31/17 0556  metroNIDAZOLE (FLAGYL) IVPB 500 mg     500 mg 100 mL/hr over 60 Minutes Intravenous On call to O.R. 07/31/17 0556 07/31/17 0840   07/31/17 0556  ciprofloxacin (CIPRO) IVPB 400 mg     400 mg 200 mL/hr over 60 Minutes Intravenous On call to O.R. 07/31/17 0556 07/31/17 0914   07/08/17 1600  fluconazole (DIFLUCAN) IVPB 100 mg     100 mg 50 mL/hr over 60 Minutes Intravenous Every 24 hours 07/08/17 0828 07/14/17 1710   07/01/17 1500  fluconazole (DIFLUCAN) IVPB 100 mg     100 mg 50 mL/hr over 60 Minutes Intravenous Every 24 hours 07/01/17 1401 07/07/17 1715    06/16/17 1200  piperacillin-tazobactam (ZOSYN) IVPB 3.375 g     3.375 g 12.5 mL/hr over 240 Minutes Intravenous Every 8 hours 06/16/17 1026 06/17/17 0004   06/08/17 1200  piperacillin-tazobactam (ZOSYN) IVPB 3.375 g  Status:  Discontinued     3.375 g 12.5 mL/hr over 240 Minutes Intravenous Every 8 hours 06/08/17 1056 06/16/17 1026   05/29/17 2200  ceFEPIme (MAXIPIME) 2 g in dextrose 5 % 50 mL IVPB  Status:  Discontinued     2 g 100 mL/hr over 30 Minutes Intravenous Every 8 hours 05/29/17 2031 06/03/17 0832   05/29/17 2100  metroNIDAZOLE (FLAGYL) IVPB 500 mg  Status:  Discontinued     500 mg 100 mL/hr over 60 Minutes Intravenous Every 8 hours 05/29/17 2029 06/03/17 0832   05/26/17 2200  ceFAZolin (ANCEF) IVPB 1 g/50 mL premix  Status:  Discontinued     1 g 100 mL/hr over 30 Minutes Intravenous Every 8 hours 05/26/17 1008 05/29/17 2023   05/25/17 1100  vancomycin (VANCOCIN) 1,250 mg in sodium chloride 0.9 % 250 mL IVPB  Status:  Discontinued     1,250 mg 166.7 mL/hr over 90 Minutes Intravenous Every 12 hours 05/25/17 0955 05/26/17 0946   05/22/17 1400  anidulafungin (ERAXIS) 100 mg in sodium chloride 0.9 % 100 mL IVPB     100 mg 78 mL/hr over 100 Minutes Intravenous Every 24 hours 05/21/17 1330 05/27/17 1740   05/21/17 1400  anidulafungin (ERAXIS) 200 mg in sodium chloride 0.9 % 200 mL IVPB     200 mg 78 mL/hr over 200 Minutes Intravenous  Once 05/21/17 1330 05/21/17 1940   05/15/17 1000  anidulafungin (ERAXIS) 100 mg in sodium chloride 0.9 % 100 mL IVPB  Status:  Discontinued     100 mg 78 mL/hr over 100 Minutes Intravenous Every 24 hours 05/14/17 0829 05/16/17 0901   05/15/17 1000  metroNIDAZOLE (FLAGYL) IVPB 500 mg  Status:  Discontinued     500 mg 100 mL/hr over 60 Minutes Intravenous Every 8 hours 05/15/17 0903 05/25/17 0938   05/14/17 0900  anidulafungin (ERAXIS) 200 mg in sodium chloride 0.9 % 200 mL IVPB     200 mg 78 mL/hr over 200 Minutes Intravenous  Once 05/14/17 0829  05/14/17 1325   05/13/17 1927  vancomycin (VANCOCIN) 1-5 GM/200ML-% IVPB    Comments:  Ward, Christa   : cabinet override      05/13/17 1927 05/14/17 0744   05/12/17 1400  ceFEPIme (  MAXIPIME) 1 g in dextrose 5 % 50 mL IVPB     1 g 100 mL/hr over 30 Minutes Intravenous Every 8 hours 05/12/17 0939 05/26/17 2359   05/12/17 0900  vancomycin (VANCOCIN) IVPB 1000 mg/200 mL premix  Status:  Discontinued     1,000 mg 200 mL/hr over 60 Minutes Intravenous Every 12 hours 05/12/17 0750 05/16/17 0852   05/12/17 0830  aztreonam (AZACTAM) 2 GM IVPB     2 g 100 mL/hr over 30 Minutes Intravenous  Once 05/12/17 0800 05/12/17 0957   05/06/17 0916  vancomycin (VANCOCIN) IVPB 1000 mg/200 mL premix     1,000 mg 200 mL/hr over 60 Minutes Intravenous On call to O.R. 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 S/p laparoscopic converted to open right adrenalectomy, small bowel resection with anastomosis, placement of 20cm^2 wound vac, 05/06/17, Dr. Lurena Joiner [name] 2. Post op wound dehiscence - primary repair of small bowel anastomotic leak, abdominal closure of wound dehisence, 05/13/17, Luke [name] 3. S/p reopening of recent laparotomy, lysis of adhesions, small bowel resection, placement 200cm^2 negative pressure dressing, 05/14/17, Luke [name] 4. S/p reopening of recent laparotomy, creation of tube ileostomy, placement of 100cm2 negative pressure dressing, 05/17/17, Luke [name] 5. S/p abdominal wash out, placement of mesh for abdominal closure, placement of wound vac of 100cm^2, 05/20/17, Luke [name] 6. S/p Bronchoscopy 05/22/17 Dr. Simonne Maffucci 7.  Laparoscopy, laparotomy, extensive enterolysis with removal of mesh (partially) in the right upper quadrant; resection of old anastomosis and ileotransverse anastomosis, 07/31/17,Dr.Carmen Tolliver Hassell Done EC fistula 06/13/17 - noted on exam Sepsis -  Acute respiratory failure/ post  op HCAP pneumonia/post op pleural effusion - CCM/extubated 05/28/17 Malnutrition - PICC/TNA 05/14/17 Hx of hypertension Hx of anemia Hx of anxiety Hx of hypothyroid Chronic pain medicine use - neck pain Tobacco use FEN: NPO/ice chips/cyclic TNA - started 70/0/17 - pharmacy adjusting electrolytes/Phos ID: Vancomycin 7/11- 05/25/17, Maixpime 7/17-7/31/18, Eraxis 7/19-05/27/17, Diflucan 9/5-9/14, Cipro/ Flagyl pre-op 07/31/17 DVT: Lovenox    Plan:  Tid dressing changes, work on getting him OOB.  Will check on D/Cing foley.  Once he is up will try some clamping trials.  Continue IV Tylenol Valium 5 mg x 2 Fentanyl Bolus 100 mcg x 6 Fentanyl PCA 505 mcg Fentanyl patch 175 mcg lidoderm patch Ativan 2 mg x 4 Robaxin 1 gm x 3 Nicotine 14 mg patch Phenergan 25 mg IV x 2 Reglan 10 mg x 3 Zofran 4 mg x 1   LOS: 90 days    JENNINGS,WILLARD 08/04/2017 [phone]  Would keep at bedrest for now.  Despite BM, I want to go slowly on returning to POs.    Kaylyn Lim, MD, FACS

## 2017-08-04 NOTE — Progress Notes (Signed)
Pt educated on need and purpose for incentive spirometer. Encouraged pt to use IS 10 times hourly while awake. Educated on early mobilization; Encouraged pt to sit up in bed. Will continue to educate and monitor pt closely.

## 2017-08-04 NOTE — Progress Notes (Signed)
Patient has asked for pain medication on multiple occasions throughout the night, however each time I walked into the room he was comfortably asleep. I attempted to give him his 2 PO medications this evening and I could not finish a sentence without having him fall asleep again. I did not feel comfortable trying to get him to swallow anything, and I commented on this in the [facility] for those 2 medications. I did not give him his morning valium for this same reason.   Charlyne Quale RN

## 2017-08-05 DIAGNOSIS — R109 Unspecified abdominal pain: Secondary | ICD-10-CM

## 2017-08-05 DIAGNOSIS — Z515 Encounter for palliative care: Secondary | ICD-10-CM

## 2017-08-05 LAB — MAGNESIUM: MAGNESIUM: 2 mg/dL (ref 1.7–2.4)

## 2017-08-05 LAB — BASIC METABOLIC PANEL
Anion gap: 9 (ref 5–15)
BUN: 16 mg/dL (ref 6–20)
CALCIUM: 8.1 mg/dL — AB (ref 8.9–10.3)
CO2: 21 mmol/L — AB (ref 22–32)
CREATININE: 0.55 mg/dL — AB (ref 0.61–1.24)
Chloride: 107 mmol/L (ref 101–111)
GFR calc non Af Amer: >60 mL/min (ref >60)
Glucose, Bld: 127 mg/dL — ABNORMAL HIGH (ref 65–99)
Potassium: 3.7 mmol/L (ref 3.5–5.1)
SODIUM: 137 mmol/L (ref 135–145)

## 2017-08-05 LAB — PHOSPHORUS: Phosphorus: 3.9 mg/dL (ref 2.5–4.6)

## 2017-08-05 LAB — GLUCOSE, CAPILLARY: Glucose-Capillary: 125 mg/dL — ABNORMAL HIGH (ref 65–99)

## 2017-08-05 MED ORDER — ENOXAPARIN SODIUM 40 MG/0.4ML ~~LOC~~ SOLN
40.0000 mg | SUBCUTANEOUS | Status: DC
  Administered 2017-08-06 – 2017-09-04 (×31): 40 mg via SUBCUTANEOUS
  Filled 2017-08-05 (×30): qty 0.4

## 2017-08-05 MED ORDER — HYDROMORPHONE HCL 1 MG/ML IJ SOLN: 1.0000 mg | Freq: Once | INTRAMUSCULAR | Status: DC

## 2017-08-05 MED ORDER — NALOXONE HCL 0.4 MG/ML IJ SOLN
0.4000 mg | INTRAMUSCULAR | Status: DC | PRN
Start: 2017-08-05 — End: 2017-08-16

## 2017-08-05 MED ORDER — ONDANSETRON HCL 4 MG/2ML IJ SOLN
4.0000 mg | Freq: Four times a day (QID) | INTRAMUSCULAR | Status: DC | PRN
  Administered 2017-08-05 – 2017-08-16 (×5): 4 mg via INTRAVENOUS
  Filled 2017-08-05 (×6): qty 2

## 2017-08-05 MED ORDER — FENTANYL 40 MCG/ML IV SOLN
INTRAVENOUS | Status: DC
  Administered 2017-08-05: 120 ug via INTRAVENOUS
  Administered 2017-08-05: 1000 ug via INTRAVENOUS
  Administered 2017-08-05: 210 ug via INTRAVENOUS
  Administered 2017-08-06: 20:00:00 via INTRAVENOUS
  Administered 2017-08-06: 285.1 ug via INTRAVENOUS
  Administered 2017-08-06: 09:00:00 via INTRAVENOUS
  Administered 2017-08-06: 270 ug via INTRAVENOUS
  Administered 2017-08-06 (×2): 330 ug via INTRAVENOUS
  Administered 2017-08-07: 240 ug via INTRAVENOUS
  Administered 2017-08-07: 210 ug via INTRAVENOUS
  Filled 2017-08-05 (×4): qty 25

## 2017-08-05 MED ORDER — SODIUM CHLORIDE 0.9% FLUSH: 9.0000 mL | INTRAVENOUS | Status: DC | PRN

## 2017-08-05 MED ORDER — HYDROMORPHONE HCL 1 MG/ML IJ SOLN
1.5000 mg | Freq: Once | INTRAMUSCULAR | Status: AC
  Administered 2017-08-05: 1.5 mg via INTRAVENOUS
  Filled 2017-08-05: qty 2

## 2017-08-05 MED ORDER — DIPHENHYDRAMINE HCL 50 MG/ML IJ SOLN: 12.5000 mg | Freq: Four times a day (QID) | INTRAMUSCULAR | Status: DC | PRN

## 2017-08-05 MED ORDER — TRACE MINERALS CR-CU-MN-SE-ZN 10-1000-500-60 MCG/ML IV SOLN
INTRAVENOUS | Status: AC
  Administered 2017-08-05: 18:00:00 via INTRAVENOUS
  Filled 2017-08-05: qty 3000
  Filled 2017-08-05: qty 1000

## 2017-08-05 MED ORDER — POTASSIUM CHLORIDE 10 MEQ/50ML IV SOLN
10.0000 meq | INTRAVENOUS | Status: AC
  Administered 2017-08-05 (×6): 10 meq via INTRAVENOUS
  Filled 2017-08-05 (×6): qty 50

## 2017-08-05 MED ORDER — MAGNESIUM SULFATE 4 GM/100ML IV SOLN
4.0000 g | INTRAVENOUS | Status: AC
  Administered 2017-08-05 (×3): 4 g via INTRAVENOUS
  Filled 2017-08-05 (×3): qty 100

## 2017-08-05 MED ORDER — DIPHENHYDRAMINE HCL 12.5 MG/5ML PO ELIX: 12.5000 mg | ORAL_SOLUTION | Freq: Four times a day (QID) | ORAL | Status: DC | PRN

## 2017-08-05 MED ORDER — FAT EMULSION 20 % IV EMUL
240.0000 mL | INTRAVENOUS | Status: AC
  Administered 2017-08-05: 240 mL via INTRAVENOUS
  Filled 2017-08-05: qty 250

## 2017-08-05 NOTE — Progress Notes (Signed)
Palliative care progress note  Reason for visit: pain management  I met today with [name] [name] and his wife.  Reports that pain remains uncontrolled and this is frustrating to him.  Reviewed current regimen which includes: Fentanyl 189mg/hr patch: 42063m/24 hours Fentanyl 1003mQ 2 hour push X 7: 700m54m4 hours Fentanyl PCA: 900mc53m last 24 hours  In addition to his patches, Mr. FreyeSanto Heldbeen using approximately 66mcg3m00mcg/57ms) of fentanyl per hour from combination of IV push and PCA doses with reported poor pain control.  I discussed plan to increase PCA dosing and discontinue IV pushes to allow him to better control his pain.  His wife is concerned with continuing to increase his opioids, but agrees this may be necessary at least in the short term.    I increased PCA dose to 30mcg w34ma 12 minute lockout and 150mcg/hr57mimum, which effectively doubles the available medication via PCA.  If this in ineffective, would plan to change to 50mcg/pus45mth 20 minute lockout, which would not further increase the amount available to him overall, but may simplify how often he is needing to hit the button.  He also requested one time dose of dilaudid to see if this helped get ahead of pain prior to changing PCA.  Will plan to f/u again tomorrow.  Total time: 50 minutes Greater than 50%  of this time was spent counseling and coordinating care related to the above assessment and plan.  [name] Salsbury FreemMicheline [phone]

## 2017-08-05 NOTE — Progress Notes (Signed)
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  He seems more alert speech clearer, taking less pain meds/ativan/Phenergan.  Wife is happy with not getting him up.  Pain always first and primary topic.  Wound still soupy but slowly improving.  He is still on TNA despite the I/O record.      Objective: Vital signs in last 24 hours: Temp:  [97.8 F (36.6 C)-99.8 F (37.7 C)] 99.8 F (37.7 C) (10/10 0715) Pulse Rate:  [69-101] 92 (10/10 0600) Resp:  [15-41] 25 (10/10 0600) BP: (116-163)/(76-94) 138/83 (10/10 0600) SpO2:  [97 %-100 %] 99 % (10/10 0600) FiO2 (%):  [98 %] 98 % (10/10 0409) Weight:  [83.4 kg (183 lb 13.8 oz)] 83.4 kg (183 lb 13.8 oz) (10/10 0416) Last BM Date: 07/31/17 (ileostomy tubes) 500 urine recorded 4440 urine recorded NG 600 Afebrile, VSS Labs OK  IV Tylenol x 4 Pt refused valium Fentanyl bolus 100 mcg x 7 Fentanyl patch 175 mcg Fentanyl PCA  315 mcg Ativan 2 mg x 1 yesterday Robaxin 1000 mg x 3 Reglan 10 mg x 3 Phenergan 12.5 x 3   Intake/Output from previous day: 10/09 0701 - 10/10 0700 In: 500 [IV Piggyback:500] Out: 1517 [Urine:4440; Emesis/NG output:600] Intake/Output this shift: No intake/output data recorded.  General appearance: alert, cooperative and no distress Resp: clear to auscultation bilaterally and anterior GI: Open wounds are OK, still a little soupy.  few BS, No BM yesterday.  NG still draining fluid.    Lab Results:   Recent Labs  08/02/17 1822  WBC 17.6*  HGB 8.4*  HCT 25.0*  PLT 406*    BMET  Recent Labs  08/04/17 0408 08/05/17 0316  NA 135 137  K 3.8 3.7  CL 106 107  CO2 20* 21*  GLUCOSE 135* 127*  BUN 16 16  CREATININE 0.51* 0.55*  CALCIUM 8.1* 8.1*   PT/INR No results for input(s): LABPROT, INR in the last 72 hours.   Recent Labs Lab 07/30/17 0443 07/31/17 0700  AST 18 21  ALT 17 18  ALKPHOS 137* 142*  BILITOT 0.4 0.5  PROT 6.0*  6.4*  ALBUMIN 2.2* 2.4*     Lipase     Component Value Date/Time   LIPASE 27 09/15/2015 0934     Medications: . Chlorhexidine Gluconate Cloth  6 each Topical Daily  . diazepam  5 mg Intravenous Q8H  . enoxaparin (LOVENOX) injection  30 mg Subcutaneous Q24H  . fentaNYL  175 mcg Transdermal Q72H  . fentaNYL   Intravenous Q4H  . insulin aspart  0-15 Units Subcutaneous Q8H  . lidocaine  2 patch Transdermal Q24H  . magic mouthwash  5 mL Oral TID AC & HS  . metoCLOPramide (REGLAN) injection  10 mg Intravenous Q8H  . nicotine  14 mg Transdermal Daily  . OLANZapine zydis  5 mg Oral QHS  . pantoprazole (PROTONIX) IV  40 mg Intravenous BID    Assessment/Plan Adrenal mass/hx of prior hemicolectomy/ileocolic anastomosis 6160 1. S/p laparoscopic converted to open right adrenalectomy, small bowel resection with anastomosis, placement of 20cm^2 wound vac, 05/06/17, Dr. Lurena Joiner [name] 2. Post op wound dehiscence - primary repair of small bowel anastomotic leak, abdominal closure of wound dehisence, 05/13/17, Luke [name] 3. S/p reopening of recent laparotomy, lysis of adhesions, small bowel resection, placement 200cm^2 negative pressure dressing, 05/14/17, Luke [name] 4. S/p reopening of recent laparotomy, creation of tube  ileostomy, placement of 100cm2 negative pressure dressing, 05/17/17, Luke [name] 5. S/p abdominal wash out, placement of mesh for abdominal closure, placement of wound vac of 100cm^2, 05/20/17, Luke [name] 6. S/p Bronchoscopy 05/22/17 Dr. Simonne Maffucci 7. 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 - noted on exam Sepsis -  Acute respiratory failure/ post op HCAP pneumonia/post op pleural effusion - CCM/extubated 05/28/17 Malnutrition - PICC/TNA 05/14/17 Hx of hypertension Hx of anemia Hx of  anxiety Hx of hypothyroid Chronic pain medicine use - neck pain Tobacco use FEN: NPO/ice chips/cyclic TNA - started 41/7/40 - pharmacy adjusting electrolytes/Phos ID: Vancomycin 7/11- 05/25/17, Maixpime 7/17-7/31/18, Eraxis 7/19-05/27/17, Diflucan 9/5-9/14, Cipro/ Flagyl pre-op 07/31/17 DVT: Lovenox Foley:  In place - bed rest Follow up:  [facility];  Continue current course, Dr. Hassell Done does not want him OOB yet.  He wants to go very slow with resumption of PO's.      LOS: 91 days    [name] [name],[name] [name] 08/05/2017 [phone]   Patient seen by me early - wife sleeping at bedside.  Had restful night.  Agree with above.    Kaylyn Lim, MD,FACS

## 2017-08-05 NOTE — Progress Notes (Signed)
   08/05/17 1500  Clinical Encounter Type  Visited With Patient and family together  Visit Type Follow-up  Spiritual Encounters  Spiritual Needs Emotional;Prayer   Visited with patient and spouse.  Continues to have a positive outlook and staying strong wants to get better.  Today he was a little down.  Spent time supporting his spouse.  We prayed together and I will follow up as needed. Chaplain Katherene Ponto

## 2017-08-05 NOTE — Progress Notes (Signed)
[place] NOTE   Pharmacy Consult for TPN Indication: prolonged ileus, small bowel leak, multiple bowel surgeries  Patient Measurements: Body mass index is 26.38 kg/m. Filed Weights   08/01/17 0500 08/04/17 0410 08/05/17 0416  Weight: 185 lb 3 oz (84 kg) 184 lb 15.5 oz (83.9 kg) 183 lb 13.8 oz (83.4 kg)   HPI: 39 yoM admitted on 7/11 for enlarging adrenal mass and planned adrenalectomy.  Pharmacy consulted to dose TPN.  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   7/22 OR:  wash out, tube ileostomy, left with open abdomen & wound vac in place.  Plan for OR on 7/25 for closure 7/25 OR: mesh placed for abdominal closure, wound vac 8/2 extubated, propofol drip off 8/4 TPN off for ~ 5 hours due to line detached from filter 8/10 MD request decrease rate x 1 week 2nd low sodium 8/12 Additional sodium added to TPN due to persistent hyponatremia 8/16 Increasing rate back to goal since sodium has improved with above intervention.   8/20 Prealbumin decreased.  New apparent enteric leak per surgery notes. 8/21 CaPhos product elevated, remove electrolytes from TPN 8/25 PICC did not have blood return in 2 of the 3 ports.  Alteplase intra-catheter and function returned.  TPN infusing correctly on 8/26 per RN. 8/28: d/c SSI 8/30: added electrolytes to TPN 8/31: TPN IV line came out around ~0340 and current bag was stopped at this time.  Restart new clinimix 5/15 1L bag (no lytes since phos is elevated this morning) until next bag is due at [facility]. Will not add MVI and trace elements to this 1L bag.  Use electrolyte free clinimix with new bag at [facility] 9/1 NGT out 9/3 added electrolytes to TPN 9/4 lytes out of TPN 9/7 cycle over 18 hrs 9/8 add back electrolytes to TPN.  Cycle over 14 hours 9/9 lytes out of TPN, continue 14 hr cycle, DC SSI/CBGs 9/13 electrolytes added back to TPN 9/14 remove electrolytes from TPN 10/3 Adjusted estimated needs based on prealbumin (decreased on 9/24, low but slightly improved on 10/1) and attempt to maximize nutrition prior to surgery planned for 07/31/17 - required increase of cycle to 16 hours. 10/5 to OR for fistula-repair surgery 10/6: Lytes added back to TPN with low Phos 10/10 D/C CBGs  Insulin requirements past 24 hours: 2 units in 24 hrs (moderate SSI)   Current Nutrition: NPO  IVF: none  Central access: PICC line ordered 7/19, placed on 7/20 TPN start date: 7/20  ASSESSMENT                                                                                                           Today, 08/05/17  - Glucose:  (goal  100-150) Decadron perioperatively 10/5 and had SBG > 150, so CCS added SSI. Subsequent CBG < 150.  Low CBG 88 on 10/9 while TPN was off, CBG 124 while TPN infusing at max rate.  Very little SSI required. - Electrolytes:  K 3.7 (goal > 4 ) - daily IV replacement  Mag 2 (goal >2) - daily IV replacement  Phos 3.9, improved with lytes removed from TPN (were added back into TPN on 10/6-10/7)  Sodium normalized with additional sodium in TPN.  Corr Ca 9.38; Ca/phos product 45 (goal < 55) - Renal:  SCr wnl, stable - LFTs:  WNL except Alk Phos which is slightly elevated but relatively stable - TGs:  Slightly elevated prior to starting TPN, 145 on 10/8 - Prealbumin: Low on admission but trended up and remained at or near goal during Aug to Sep; now decreased to < 5 (10/8).    NUTRITIONAL GOALS                                                                                             RD recs (10/8):  137-153 gms Protein (1.7 - 1.9  g/kg) 2030 - 2270 Kcal (25-28 kcal/kg)  Recalculation (10/3): Clinimix 5/15 3000 mL over 16h cycle with lipids 3x/week on MWF only would provide: 150 g protein and avg 2335 kcal/day  Glucose infusion rate will be 6.5 mg/kg/min at max cyclic rate. (Maximum 7 mg/kg/min)  Electrolyte goals:  K >/= 4.0; Mg >/= 2.0 with ileus  PLAN                                                                                                                         Now:  KCl 10 mEq IV x 6  Mag 4g IV x3  At 1800 today:  Continue Clinimix 5/15 (electrolyte free) 3000 mL/day cycled over 16 hr  50 ml/hr x 1 hr (6p-7p), 207 ml/hr x 12 hr (7p-9a), 50 ml/hr x 1 hr (9a-10a), then off  Continue sodium 125 mEq/L in TPN: add as 70 mEq/L NaCl and 55 mEq/L Na Acetate   Lipids at 20 mls/hr x 12 hours 3 times weekly on MWF  Standard MVI, Trace elements, and pepcid 40 mg/day in TPN  D/C CBGs and SSI as his CBGs remain well controlled.   TPN lab panels on Mondays & Thursdays.   BMET, Mag & Phos daily ordered.  Gretta Arab PharmD, BCPS Pager [phone] 08/05/2017 7:21 AM

## 2017-08-06 LAB — COMPREHENSIVE METABOLIC PANEL
ALK PHOS: 216 U/L — AB (ref 38–126)
ALT: 20 U/L (ref 17–63)
ANION GAP: 8 (ref 5–15)
AST: 18 U/L (ref 15–41)
Albumin: 2.2 g/dL — ABNORMAL LOW (ref 3.5–5.0)
BILIRUBIN TOTAL: 0.4 mg/dL (ref 0.3–1.2)
BUN: 18 mg/dL (ref 6–20)
CALCIUM: 8.3 mg/dL — AB (ref 8.9–10.3)
CO2: 22 mmol/L (ref 22–32)
CREATININE: 0.53 mg/dL — AB (ref 0.61–1.24)
Chloride: 103 mmol/L (ref 101–111)
Glucose, Bld: 118 mg/dL — ABNORMAL HIGH (ref 65–99)
Potassium: 3.8 mmol/L (ref 3.5–5.1)
SODIUM: 133 mmol/L — AB (ref 135–145)
TOTAL PROTEIN: 6.1 g/dL — AB (ref 6.5–8.1)

## 2017-08-06 LAB — PHOSPHORUS: Phosphorus: 4.1 mg/dL (ref 2.5–4.6)

## 2017-08-06 LAB — MAGNESIUM: Magnesium: 1.8 mg/dL (ref 1.7–2.4)

## 2017-08-06 MED ORDER — ACETAMINOPHEN 10 MG/ML IV SOLN
1000.0000 mg | Freq: Four times a day (QID) | INTRAVENOUS | Status: AC
  Administered 2017-08-06 – 2017-08-07 (×4): 1000 mg via INTRAVENOUS
  Filled 2017-08-06 (×6): qty 100

## 2017-08-06 MED ORDER — POTASSIUM CHLORIDE 10 MEQ/50ML IV SOLN
10.0000 meq | INTRAVENOUS | Status: AC
  Administered 2017-08-06 (×4): 10 meq via INTRAVENOUS
  Filled 2017-08-06 (×5): qty 50

## 2017-08-06 MED ORDER — MAGNESIUM SULFATE 4 GM/100ML IV SOLN
4.0000 g | INTRAVENOUS | Status: AC
  Administered 2017-08-06 (×3): 4 g via INTRAVENOUS
  Filled 2017-08-06 (×3): qty 100

## 2017-08-06 MED ORDER — TRACE MINERALS CR-CU-MN-SE-ZN 10-1000-500-60 MCG/ML IV SOLN
INTRAVENOUS | Status: AC
  Administered 2017-08-06: 18:00:00 via INTRAVENOUS
  Filled 2017-08-06: qty 2000

## 2017-08-06 NOTE — Progress Notes (Signed)
Nutrition Follow-up  DOCUMENTATION CODES:   Not applicable  INTERVENTION:  - Continue cyclic TPN per Pharmacy. - Will continue to monitor for ability for diet advancement.   NUTRITION DIAGNOSIS:   Inadequate oral intake related to inability to eat as evidenced by NPO status. -ongoing  GOAL:   Patient will meet greater than or equal to 90% of their needs -met with cyclic TPN regimen.   MONITOR:   Diet advancement, Weight trends, Labs, Skin, I & O's, Other (Comment) (TPN regimen)  ASSESSMENT:   56 y.o. 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 On 7/18, he developed a leak therefore was taken back to OR for re-do of ex-lap and repair of leak.  7/11 TG:YBWLS adrenalectomy with complication of intestinal injury and small bowel resection with anastomosis  7/18 LH:TDSKAJ of small bowel anastomotic leak, abdominal closure of wound dehisence 7/19 OR: reopening of recent laparotomy, lysis of adhesions, noted ischemic perforation of new loop of intestine, intact repair of previous anastomotic leak repair, intact previous anastomosis.  7/22 OR: wash out, tube ileostomy, left with open abdomen &wound vac in place. Plan for OR on 7/25 for closure 7/25 OR: mesh placed for abdominal closure, wound vac 8/2extubated, propofol drip off 8/4TPN off for ~ 5 hours due to line detached from filter. Ultrasound-guided aspiration By interventional radiology of small perihepatic fluid collection yielded only 1 mL of the fluid, sent for culture. NPO - mild coffee ground on NG but no active bleed 8/6patient improving. Off Precedex drip since 8/4. Dilaudid Drip being changed over to PCA. Still with some coffee ground-looking material in NG tube.  8/10 hyponatremia and plan to decrease TPN from 3L/day to 2L/day x1  week 8/55fund to have fecal matter in wound vac (EC fistula) 8/14increase in fecal matter presence in wound vac with no plan for surgery at this time 8/16TPN advanced to goal rate of 115 ml/hr with 20% ILE @ 20 mL/hr x12 hours on MWF 8/22: NGT clamped. RD to order Boost Breeze for patient to sip on; Mycostatin started for oral thrush. 9/1:pulled NGT and no plans for replacement. 9/1:developed N/V and only permitted sips of clears, if tolerated. 9/4:NGT re-insertion attempted several times without success 9/7:begin cycling TPN 9/23: allowed to leave the floor to go outside 10/5: Laparoscopy, laparotomy, extensive enterolysis with removal of mesh (partially) in the right upper quadrant; resection of old anastomosis and ileotransverse anastomosis 10/5: NGT placement during surgery   10/11 Pt remains NPO since prior to surgery. NGT remains in place with very minimal to no output over the past few days. Current weight now consistent with weight, which was stable, for several weeks prior to surgery on 10/5. Triple lumen in place and cyclic TPN (as outlined on 10/8) continues. No plans at this time for NGT removal or for diet advancement.  Medications reviewed; 4 g IV Mg sulfate x3 runs today and x3 runs yesterday, 10 mg IV Reglan TID, 40 mg IV Protonix BID, 10 mEq IV KCl x4 runs today and x6 runs yesterday.  Labs reviewed; CBG: 125 mg/dL today, Na: 133 mmol/L, creatinine: 0.53 mg/dL, Ca: 8.3 mg/dL, Alk Phos elevated.     10/8 - NGT in place with 400cc light brown output with ~150cc of this being from this shift.  - Pt very drowsy and mumbles words; no family/visitors present.  -  Weight stable 10/2-10/4 and then +10 lbs/4.4 kg from 10/4-10/6 (likely from surgery).  - Cyclic TPN regimen was changed to be over 16 hours on 10/3.  - Spoke with Pharmacist this AM and she reports plan to continue current cyclic regimen but remove lytes so regimen will be: Clinimix 5/15 over 16 hours for 3L  with 20% ILE @ 20 mL/hr x12 hours on MWF.  - This regimen provides a daily average of 150 grams of protein 2336 kcal.  - Plans to empirically order K and Mg runs as these tend to drop low for this pt when lytes are removed from his TPN.    Phos: 5 mg/dL.    10/2 - Pt remains NPO with cyclic TPN regimen.  - Spoke with Pharmacist concerning prealbumin; adjusted estimated needs in light of this and continued plan for surgery on Friday (10/5) and wanting to maximizing nutrition prior to surgery.  - Pharmacist plans to make adjustments tomorrow for TPN (will likely maintain rate but increase from 14 hours to 16 hours/day).  - Weight continues to fluctuate; will monitor closely, especially with increases to TPN volume.  4 g IV Mg sulfate x2 runs today K: 3.3 mmol/L   Diet Order:  Diet NPO time specified TPN (CLINIMIX) Adult without lytes  Skin:  Abdominal incision from 07/31/17  Last BM:  10/10  Height:   Ht Readings from Last 1 Encounters:  05/26/17 _0  (1.778 m)    Weight:   Wt Readings from Last 1 Encounters:  08/06/17 176 lb 9.4 oz (80.1 kg)    Ideal Body Weight:  75.45 kg  BMI:  Body mass index is 25.34 kg/m.  Estimated Nutritional Needs:   Kcal:  2030-2270 (25-28 kcal/kg)  Protein:  137-153 grams (1.7-1.9 grams/kg)  Fluid:  2 L/day  EDUCATION NEEDS:   No education needs identified at this time    Jarome Matin, MS, RD, LDN, CNSC Inpatient Clinical Dietitian Pager # [phone] After hours/weekend pager # [phone]

## 2017-08-06 NOTE — Progress Notes (Signed)
[place] NOTE   Pharmacy Consult for TPN Indication: prolonged ileus, small bowel leak, multiple bowel surgeries  Patient Measurements: Body mass index is 26.38 kg/m. Filed Weights   08/01/17 0500 08/04/17 0410 08/05/17 0416  Weight: 185 lb 3 oz (84 kg) 184 lb 15.5 oz (83.9 kg) 183 lb 13.8 oz (83.4 kg)   HPI: 41 yoM admitted on 7/11 for enlarging adrenal mass and planned adrenalectomy.  Pharmacy consulted to dose TPN.  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   7/22 OR:  wash out, tube ileostomy, left with open abdomen & wound vac in place.  Plan for OR on 7/25 for closure 7/25 OR: mesh placed for abdominal closure, wound vac 8/2 extubated, propofol drip off 8/4 TPN off for ~ 5 hours due to line detached from filter 8/10 MD request decrease rate x 1 week 2nd low sodium 8/12 Additional sodium added to TPN due to persistent hyponatremia 8/16 Increasing rate back to goal since sodium has improved with above intervention.   8/20 Prealbumin decreased.  New apparent enteric leak per surgery notes. 8/21 CaPhos product elevated, remove electrolytes from TPN 8/25 PICC did not have blood return in 2 of the 3 ports.  Alteplase intra-catheter and function returned.  TPN infusing correctly on 8/26 per RN. 8/28: d/c SSI 8/30: added electrolytes to TPN 8/31: TPN IV line came out around ~0340 and current bag was stopped at this time.  Restart new clinimix 5/15 1L bag (no lytes since phos is elevated this morning) until next bag is due at [facility]. Will not add MVI and trace elements to this 1L bag.  Use electrolyte free clinimix with new bag at [facility] 9/1 NGT out 9/3 added electrolytes to TPN 9/4 lytes out of TPN 9/7 cycle over 18 hrs 9/8 add back electrolytes to TPN.  Cycle over 14 hours 9/9 lytes out of TPN, continue 14 hr cycle, DC SSI/CBGs 9/13 electrolytes added back to TPN 9/14 remove electrolytes from TPN 10/3 Adjusted estimated needs based on prealbumin (decreased on 9/24, low but slightly improved on 10/1) and attempt to maximize nutrition prior to surgery planned for 07/31/17 - required increase of cycle to 16 hours. 10/5 to OR for fistula-repair surgery 10/6: Lytes added back to TPN with low Phos 10/10 D/C CBGs  Insulin requirements past 24 hours: 2 units in 24 hrs (moderate SSI)   Current Nutrition: NPO  IVF: none  Central access: PICC line ordered 7/19, placed on 7/20 TPN start date: 7/20  ASSESSMENT                                                                                                           Today, 08/06/17  - Glucose:  (goal  100-150) Decadron perioperatively 10/5 and had SBG > 150, so CCS added SSI. Subsequent CBG < 150.  Low CBG 88 on 10/9 while TPN was off, CBG 124 while TPN infusing at max rate.  Very little SSI required. - Electrolytes:  K 3.8 (goal > 4 ) - daily IV replacement  Mag 1.8 (goal >2) - daily IV replacement  Phos 4.1, improved with lytes removed from TPN (were added back into TPN on 10/6-10/7)  Na decreased to 133 despite additional sodium in TPN.  Corr Ca 9.74; Ca/phos product 39 (goal < 55) - Renal:  SCr wnl, stable  - I/O 3266/1750 (NGT 233m) - LFTs:  WNL except Alk Phos which is elevated, trending up - TGs:  Slightly elevated prior to starting TPN, 145 on 10/8 - Prealbumin: Low on admission but trended up and remained at or near goal during Aug to Sep; now decreased to < 5 (10/8).    NUTRITIONAL GOALS                                                                                             RD recs (10/8):  137-153 gms  Protein (1.7 - 1.9 g/kg) 2030 - 2270 Kcal (25-28 kcal/kg)  Recalculation (10/3): Clinimix 5/15 3000 mL over 16h cycle with lipids 3x/week on MWF only would provide: 150 g protein and avg 2335 kcal/day  Glucose infusion rate will be 6.5 mg/kg/min at max cyclic rate. (Maximum 7 mg/kg/min)  Electrolyte goals:  K >/= 4.0; Mg >/= 2.0 with ileus  PLAN                                                                                                                         Now:  KCl 10 mEq IV x 4  Mag 4g IV x3  At 1800 today:  Continue Clinimix 5/15 (electrolyte free) 3000 mL/day cycled over 16 hr  50 ml/hr x 1 hr (6p-7p), 207 ml/hr x 12 hr (7p-9a), 50 ml/hr x 1 hr (9a-10a), then off  Continue sodium 125 mEq/L in TPN: add as 70 mEq/L NaCl and 55 mEq/L Na Acetate   Lipids at 20 mls/hr x 12 hours 3 times weekly on MWF  Standard MVI, Trace elements, and pepcid 40 mg/day in TPN  D/C CBGs and SSI as his CBGs remain well controlled.   TPN lab panels on Mondays & Thursdays.   BMET, Mag & Phos daily ordered.  DDoreene Eland PharmD, BCPS.   Pager: [phone]/08/2017 7:48 AM

## 2017-08-06 NOTE — Progress Notes (Signed)
Date:  August 06, 2017 Chart reviewed for concurrent status and case management needs.  Will continue to follow patient progress.  Discharge Planning: following for needs  Expected discharge date: [id number]  Vivan Agostino, BSN, RN3, CCM   [phone]  

## 2017-08-06 NOTE — Progress Notes (Signed)
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 If I am reading [facility] correctly he is using less pain meds.  His wife seems a little more at ease.  Wounds still soupy but cleaning up.  He  Reports another BM yesterday.  NG drainage is minimal.   Objective: Vital signs in last 24 hours: Temp:  [98.6 F (37 C)-99.9 F (37.7 C)] 99.2 F (37.3 C) (10/11 0300) Pulse Rate:  [75-101] 99 (10/11 0300) Resp:  [14-28] 18 (10/11 0400) BP: (111-163)/(72-98) 111/79 (10/11 0300) SpO2:  [94 %-100 %] 97 % (10/11 0400) Last BM Date: 08/05/17 3300 IV 1550 urine 200 NG Afebrile, VSS CMP OK Pain control: IV tylenol 4 gm Valium 5 mg x 2 Fentanyl patch 175 mcg Fentanyl bolus 100 mcg x 3 Fentanyl PCA   105 mch listed Dilaudid 1.5 mg x 1 Ativan 2 mg x 3 Robaxin 1 gm x 3 Reglan x 3/zofran 4 mg x 2/phenergan 12.5 x 1 Nicotine x 14 mg patch Intake/Output from previous day: 10/10 0701 - 10/11 0700 In: 3265.8 [I.V.:2185.8; IV Piggyback:1080] Out: 5176 [Urine:1550; Emesis/NG output:200] Intake/Output this shift: No intake/output data recorded.  General appearance: alert, cooperative and no distress Resp: clear to auscultation bilaterally GI: open wounds are ok, still soupy but clean.  few BS, + BM  Lab Results:  No results for input(s): WBC, HGB, HCT, PLT in the last 72 hours.  BMET  Recent Labs  08/05/17 0316 08/06/17 0500  NA 137 133*  K 3.7 3.8  CL 107 103  CO2 21* 22  GLUCOSE 127* 118*  BUN 16 18  CREATININE 0.55* 0.53*  CALCIUM 8.1* 8.3*   PT/INR No results for input(s): LABPROT, INR in the last 72 hours.   Recent Labs Lab 07/31/17 0700 08/06/17 0500  AST 21 18  ALT 18 20  ALKPHOS 142* 216*  BILITOT 0.5 0.4  PROT 6.4* 6.1*  ALBUMIN 2.4* 2.2*     Lipase     Component Value Date/Time   LIPASE 27 09/15/2015 0934     Medications: . Chlorhexidine Gluconate Cloth  6 each Topical Daily  .  diazepam  5 mg Intravenous Q8H  . enoxaparin (LOVENOX) injection  40 mg Subcutaneous Q24H  . fentaNYL  175 mcg Transdermal Q72H  . fentaNYL   Intravenous Q4H  . lidocaine  2 patch Transdermal Q24H  . magic mouthwash  5 mL Oral TID AC & HS  . metoCLOPramide (REGLAN) injection  10 mg Intravenous Q8H  . nicotine  14 mg Transdermal Daily  . OLANZapine zydis  5 mg Oral QHS  . pantoprazole (PROTONIX) IV  40 mg Intravenous BID    Assessment/Plan  Adrenal mass/hx of prior hemicolectomy/ileocolic anastomosis 1607 1. S/p laparoscopic converted to open right adrenalectomy, small bowel resection with anastomosis, placement of 20cm^2 wound vac, 05/06/17, Dr. Lurena Joiner [name] 2. Post op wound dehiscence - primary repair of small bowel anastomotic leak, abdominal closure of wound dehisence, 05/13/17, Luke [name] 3. S/p reopening of recent laparotomy, lysis of adhesions, small bowel resection, placement 200cm^2 negative pressure dressing, 05/14/17, Luke [name] 4. S/p reopening of recent laparotomy, creation of tube ileostomy, placement of 100cm2 negative pressure dressing, 05/17/17, Luke [name] 5. S/p abdominal wash out, placement of mesh for abdominal closure, placement of wound vac of 100cm^2, 05/20/17, Luke [name] 6. S/p Bronchoscopy 05/22/17 Dr. Simonne Maffucci 7. 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 Hassell Done        LOS: 92 days    Camara Rosander 08/06/2017 [phone]

## 2017-08-06 NOTE — Progress Notes (Signed)
Palliative care progress note  Reason for visit: pain management  I met today with [name] [name] and his wife.  Reports that pain was better controlled following PCA adjustment.  Reviewed current regimen which includes: Fentanyl 147mg/hr patch: 42037m/24 hours Fentanyl PCA: 142062min last 24 hours  Continue PCA dose to 14m56mith a 12 minute lockout and 150mc108m maximum.  We discussed plan to continue same regimen for now.  Will plan to f/u again tomorrow.  Total time: 30 minutes Greater than 50%  of this time was spent counseling and coordinating care related to the above assessment and plan.  Romeka Scifres Micheline RoughCone Trinway [phone]

## 2017-08-07 DIAGNOSIS — R1084 Generalized abdominal pain: Secondary | ICD-10-CM

## 2017-08-07 LAB — BASIC METABOLIC PANEL
Anion gap: 10 (ref 5–15)
BUN: 17 mg/dL (ref 6–20)
CALCIUM: 8.4 mg/dL — AB (ref 8.9–10.3)
CHLORIDE: 99 mmol/L — AB (ref 101–111)
CO2: 23 mmol/L (ref 22–32)
CREATININE: 0.5 mg/dL — AB (ref 0.61–1.24)
Glucose, Bld: 139 mg/dL — ABNORMAL HIGH (ref 65–99)
Potassium: 3.5 mmol/L (ref 3.5–5.1)
SODIUM: 132 mmol/L — AB (ref 135–145)

## 2017-08-07 LAB — CBC
HCT: 29.1 % — ABNORMAL LOW (ref 39.0–52.0)
Hemoglobin: 9.3 g/dL — ABNORMAL LOW (ref 13.0–17.0)
MCH: 24.7 pg — ABNORMAL LOW (ref 26.0–34.0)
MCHC: 32 g/dL (ref 30.0–36.0)
MCV: 77.4 fL — ABNORMAL LOW (ref 78.0–100.0)
PLATELETS: 731 10*3/uL — AB (ref 150–400)
RBC: 3.76 MIL/uL — AB (ref 4.22–5.81)
RDW: 18.5 % — ABNORMAL HIGH (ref 11.5–15.5)
WBC: 15.5 10*3/uL — AB (ref 4.0–10.5)

## 2017-08-07 LAB — PHOSPHORUS: Phosphorus: 4.2 mg/dL (ref 2.5–4.6)

## 2017-08-07 LAB — MAGNESIUM: MAGNESIUM: 1.5 mg/dL — AB (ref 1.7–2.4)

## 2017-08-07 MED ORDER — FAT EMULSION 20 % IV EMUL
240.0000 mL | INTRAVENOUS | Status: AC
  Administered 2017-08-07: 240 mL via INTRAVENOUS
  Filled 2017-08-07: qty 250

## 2017-08-07 MED ORDER — HYDROMORPHONE HCL 1 MG/ML IJ SOLN
1.0000 mg | Freq: Once | INTRAMUSCULAR | Status: AC
  Administered 2017-08-07: 1 mg via INTRAVENOUS
  Filled 2017-08-07: qty 1

## 2017-08-07 MED ORDER — FENTANYL 40 MCG/ML IV SOLN
INTRAVENOUS | Status: DC
  Administered 2017-08-07: 40 ug via INTRAVENOUS
  Administered 2017-08-07: 448.3 ug via INTRAVENOUS
  Administered 2017-08-08: 1140 ug via INTRAVENOUS
  Administered 2017-08-08: 22:00:00 via INTRAVENOUS
  Administered 2017-08-08: 1000 ug via INTRAVENOUS
  Administered 2017-08-08: 592.4 ug via INTRAVENOUS
  Administered 2017-08-08: 502.3 ug via INTRAVENOUS
  Administered 2017-08-08: 472.2 ug via INTRAVENOUS
  Administered 2017-08-08: 638.5 ug via INTRAVENOUS
  Administered 2017-08-08 – 2017-08-09 (×3): via INTRAVENOUS
  Administered 2017-08-09: 345.7 ug via INTRAVENOUS
  Administered 2017-08-09: 1135 ug via INTRAVENOUS
  Administered 2017-08-09: 341 ug via INTRAVENOUS
  Administered 2017-08-09: 1000 ug via INTRAVENOUS
  Administered 2017-08-09: 646.1 ug via INTRAVENOUS
  Administered 2017-08-09: 264.8 ug via INTRAVENOUS
  Administered 2017-08-10: 1000 ug via INTRAVENOUS
  Administered 2017-08-10: 23:00:00 via INTRAVENOUS
  Administered 2017-08-10: 499.6 ug via INTRAVENOUS
  Administered 2017-08-10: 08:00:00 via INTRAVENOUS
  Administered 2017-08-10: 499.4 ug via INTRAVENOUS
  Administered 2017-08-10: 15:00:00 via INTRAVENOUS
  Administered 2017-08-10: 441.5 ug via INTRAVENOUS
  Administered 2017-08-10: 850.8 ug via INTRAVENOUS
  Administered 2017-08-10: 680.7 ug via INTRAVENOUS
  Administered 2017-08-11: 09:00:00 via INTRAVENOUS
  Administered 2017-08-11: 192.2 ug via INTRAVENOUS
  Administered 2017-08-11: 364.5 ug via INTRAVENOUS
  Filled 2017-08-07 (×14): qty 25

## 2017-08-07 MED ORDER — FENTANYL 40 MCG/ML IV SOLN
INTRAVENOUS | Status: DC
  Administered 2017-08-07: 10:00:00 via INTRAVENOUS
  Filled 2017-08-07: qty 25

## 2017-08-07 MED ORDER — MAGNESIUM SULFATE 4 GM/100ML IV SOLN
4.0000 g | INTRAVENOUS | Status: AC
  Administered 2017-08-07 (×3): 4 g via INTRAVENOUS
  Filled 2017-08-07 (×3): qty 100

## 2017-08-07 MED ORDER — TRACE MINERALS CR-CU-MN-SE-ZN 10-1000-500-60 MCG/ML IV SOLN
INTRAVENOUS | Status: AC
  Administered 2017-08-07: 18:00:00 via INTRAVENOUS
  Filled 2017-08-07: qty 1000

## 2017-08-07 MED ORDER — POTASSIUM CHLORIDE 10 MEQ/50ML IV SOLN
10.0000 meq | INTRAVENOUS | Status: AC
  Administered 2017-08-07 (×6): 10 meq via INTRAVENOUS
  Filled 2017-08-07 (×6): qty 50

## 2017-08-07 NOTE — Progress Notes (Signed)
   08/07/17 1500  Clinical Encounter Type  Visited With Patient and family together  Visit Type Follow-up  Spiritual Encounters  Spiritual Needs Emotional  Stress Factors  Patient Stress Factors Exhausted   Continue to follow patient as he is wanting to get better.  Nurse had given some medicine and patient was sleepy.  Spoke with spouse and another family member.  Will continue to follow. Chaplain Katherene Ponto

## 2017-08-07 NOTE — Progress Notes (Addendum)
[place] NOTE   Pharmacy Consult for TPN Indication: prolonged ileus, small bowel leak, multiple bowel surgeries  Patient Measurements: Body mass index is 25.34 kg/m. Filed Weights   08/04/17 0410 08/05/17 0416 08/06/17 1147  Weight: 184 lb 15.5 oz (83.9 kg) 183 lb 13.8 oz (83.4 kg) 176 lb 9.4 oz (80.1 kg)   HPI: 28 yoM admitted on 7/11 for enlarging adrenal mass and planned adrenalectomy.  Pharmacy consulted to dose TPN.  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   7/22 OR:  wash out, tube ileostomy, left with open abdomen & wound vac in place.  Plan for OR on 7/25 for closure 7/25 OR: mesh placed for abdominal closure, wound vac 8/2 extubated, propofol drip off 8/4 TPN off for ~ 5 hours due to line detached from filter 8/10 MD request decrease rate x 1 week 2nd low sodium 8/12 Additional sodium added to TPN due to persistent hyponatremia 8/16 Increasing rate back to goal since sodium has improved with above intervention.   8/20 Prealbumin decreased.  New apparent enteric leak per surgery notes. 8/21 CaPhos product elevated, remove electrolytes from TPN 8/25 PICC did not have blood return in 2 of the 3 ports.  Alteplase intra-catheter and function returned.  TPN infusing correctly on 8/26 per RN. 8/28: d/c SSI 8/30: added electrolytes to TPN 8/31: TPN IV line came out around ~0340 and current bag was stopped at this time.  Restart new clinimix 5/15 1L bag (no lytes since phos is elevated this morning) until next bag is due at [facility]. Will not add MVI and trace elements to this 1L  bag. Use electrolyte free clinimix with new bag at [facility] 9/1 NGT out 9/3 added electrolytes to TPN 9/4 lytes out of TPN 9/7 cycle over 18 hrs 9/8 add back electrolytes to TPN.  Cycle over 14 hours 9/9 lytes out of TPN, continue 14 hr cycle, DC SSI/CBGs 9/13 electrolytes added back to TPN 9/14 remove electrolytes from TPN 10/3 Adjusted estimated needs based on prealbumin (decreased on 9/24, low but slightly improved on 10/1) and attempt to maximize nutrition prior to surgery planned for 07/31/17 - required increase of cycle to 16 hours. 10/5 to OR for fistula-repair surgery 10/6: Lytes added back to TPN with low Phos 10/10 D/C CBGs  Insulin requirements past 24 hours: 2 units in 24 hrs (moderate SSI)   Current Nutrition: NPO  IVF: none  Central access: PICC line ordered 7/19, placed on 7/20 TPN start date: 7/20  ASSESSMENT                                                                                                           Today, 08/07/17  - Glucose:  (goal  100-150) Decadron perioperatively 10/5 and had SBG > 150, so CCS added SSI. Subsequent CBG < 150.  Low CBG 88 on 10/9 while TPN was off, CBG 124 while TPN infusing at max rate.  Very little SSI required. - Electrolytes:  K 3.5 (goal > 4 ) - daily IV replacement  Mag 1.5 (goal >2) - daily IV replacement  Phos 4.2, improved with lytes removed from TPN (were added back into TPN on 10/6-10/7)  Na decreased to 132 despite additional sodium in TPN.  Corr Ca 9.74; Ca/phos product 39 (goal < 55) - Renal:  SCr wnl, stable  -NGT output - 730m.  Weight looks OK, down vs last 4 days - LFTs:  WNL except Alk Phos which is elevated, trending up - TGs:  Slightly elevated prior to starting TPN, 145 on 10/8 - Prealbumin: Low on admission but trended up and remained at or near goal during Aug to Sep; now decreased to < 5 (10/8).    NUTRITIONAL GOALS                                                                                              RD recs (10/8):  137-153 gms Protein (1.7 - 1.9 g/kg) 2030 - 2270 Kcal (25-28 kcal/kg)  Recalculation (10/3): Clinimix 5/15 3000 mL over 16h cycle with lipids 3x/week on MWF only would provide: 150 g protein and avg 2335 kcal/day  Glucose infusion rate will be 6.5 mg/kg/min at max cyclic rate. (Maximum 7 mg/kg/min)  Electrolyte goals:  K >/= 4.0; Mg >/= 2.0 with ileus  PLAN                                                                                                                         Now:  KCl 10 mEq IV x 6  Mag 4g IV x3  At 1800 today:  Continue Clinimix 5/15 (electrolyte free) 3000 mL/day cycled over 16 hr  50 ml/hr x 1 hr (6p-7p), 207 ml/hr x 12 hr (7p-9a), 50 ml/hr x 1 hr (9a-10a), then off  Increase sodium 125 mEq/L in TPN: add as 70 mEq/L NaCl and 55 mEq/L Na Acetate   Will consider increasing to Normal Saline if Na continues to decrease  Lipids at 20 mls/hr x 12 hours 3 times weekly on MWF  Standard MVI, Trace elements, and pepcid 40 mg/day in TPN  D/C CBGs and SSI as his CBGs remain well controlled.   TPN lab panels on Mondays & Thursdays.   BMET, Mag & Phos daily ordered.  DDoreene Eland PharmD, BCPS.   Pager: [phone]/09/2017 8:35 AM

## 2017-08-07 NOTE — Progress Notes (Signed)
Palliative care progress note  Reason for visit: pain management  I met today with Mr. Hansley.  Reports that pain was better controlled following initial PCA adjustment but seems worse again now.   Reviewed current regimen which includes: Fentanyl 174mg/hr patch: 42052m/24 hours Fentanyl PCA: 180084min last 24 hours  He has been averaging roughly additional 4m52mer hour via PCA with reports pain control is worse.  Very frustrated that he is needing to wake up in pain and hit button.   Plan for addition of basal rate and will also increase PCA dose to 50mc36mile increasing the lockout to 20min19mI am thinking we may need to consider rotation back to dilaudid if this is ineffective.  Will plan to f/u again tomorrow.  Total time: 30 minutes Greater than 50%  of this time was spent counseling and coordinating care related to the above assessment and plan.  Lael Pilch FMicheline Roughone HManasota [phone]

## 2017-08-07 NOTE — Progress Notes (Signed)
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  Dressing changed at 3 AM, still has a fair amount of purulent drainage at the base of all the wounds.  There is a loss of tissue at the base of the lateral incision.  Sutures are in the air in parts of this wound.  Retention sutures are also starting to get red at the distal ends of the retention. I have ask the nurses to clean around these with Q-tips.  He had a BM yesterday and today already.    Objective: Vital signs in last 24 hours: Temp:  [98.2 F (36.8 C)-99.1 F (37.3 C)] 98.2 F (36.8 C) (10/12 0400) Pulse Rate:  [88-102] 93 (10/12 0400) Resp:  [11-26] 14 (10/12 0400) BP: (116-143)/(75-98) 126/77 (10/12 0400) SpO2:  [33 %-100 %] 97 % (10/12 0400) Weight:  [80.1 kg (176 lb 9.4 oz)] 80.1 kg (176 lb 9.4 oz) (10/11 1147) Last BM Date: 08/05/17 1908 IV 450 urine recorded NG 700 recorded BM x 1 recorded Afebrile, VSS No labs this AM   Intake/Output from previous day: 10/11 0701 - 10/12 0700 In: 1908.5 [I.V.:1348.5; IV Piggyback:560] Out: 0960 [Urine:450; Emesis/NG output:700; Stool:1] Intake/Output this shift: No intake/output data recorded.  General appearance: alert, cooperative, no distress and chronic pain.   Resp: clear to auscultation bilaterally and anterior GI: few BS, wound as noted above.  + BM, NG is a little bloody this AM  Lab Results:  No results for input(s): WBC, HGB, HCT, PLT in the last 72 hours.  BMET  Recent Labs  08/05/17 0316 08/06/17 0500  NA 137 133*  K 3.7 3.8  CL 107 103  CO2 21* 22  GLUCOSE 127* 118*  BUN 16 18  CREATININE 0.55* 0.53*  CALCIUM 8.1* 8.3*   PT/INR No results for input(s): LABPROT, INR in the last 72 hours.   Recent Labs Lab 08/06/17 0500  AST 18  ALT 20  ALKPHOS 216*  BILITOT 0.4  PROT 6.1*  ALBUMIN 2.2*     Lipase     Component Value Date/Time   LIPASE 27  09/15/2015 0934     Medications: . Chlorhexidine Gluconate Cloth  6 each Topical Daily  . diazepam  5 mg Intravenous Q8H  . enoxaparin (LOVENOX) injection  40 mg Subcutaneous Q24H  . fentaNYL  175 mcg Transdermal Q72H  . fentaNYL   Intravenous Q4H  . lidocaine  2 patch Transdermal Q24H  . magic mouthwash  5 mL Oral TID AC & HS  . metoCLOPramide (REGLAN) injection  10 mg Intravenous Q8H  . nicotine  14 mg Transdermal Daily  . OLANZapine zydis  5 mg Oral QHS  . pantoprazole (PROTONIX) IV  40 mg Intravenous BID   . methocarbamol (ROBAXIN)  IV Stopped (08/07/17 0059)  . ondansetron (ZOFRAN) IV Stopped (08/02/17 2340)  . TPN (CLINIMIX) Adult without lytes 50 mL/hr at 08/07/17 0400    Assessment/Plan Adrenal mass/hx of prior hemicolectomy/ileocolic anastomosis 4540 1. S/p laparoscopic converted to open right adrenalectomy, small bowel resection with anastomosis, placement of 20cm^2 wound vac, 05/06/17, Dr. Lurena Joiner [name] 2. Post op wound dehiscence - primary repair of small bowel anastomotic leak, abdominal closure of wound dehisence, 05/13/17, Luke [name] 3. S/p reopening of recent laparotomy, lysis of adhesions, small bowel resection, placement 200cm^2 negative pressure dressing, 05/14/17, Luke [name] 4. S/p reopening of recent laparotomy, creation of tube ileostomy,  placement of 100cm2 negative pressure dressing, 05/17/17, Luke [name] 5. S/p abdominal wash out, placement of mesh for abdominal closure, placement of wound vac of 100cm^2, 05/20/17, Luke [name] 6. S/p Bronchoscopy 05/22/17 Dr. Simonne Maffucci 7. 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 Tobacco use FEN: NPO/ice chips/cyclic TNA - started 42/1/03 - pharmacy adjusting electrolytes/Phos ID: Vancomycin 7/11- 05/25/17, Maixpime 7/17-7/31/18, Eraxis 7/19-05/27/17, Diflucan 9/5-9/14, Cipro/ Flagyl pre-op 07/31/17 DVT: Lovenox Foley: In place - bed rest Follow up: Hassell Done   Plan:  Continued local wound care, I ask the nursing staff to clean suture sites with each dressing change.  Palliative following for pain control.  Plan per Dr. Hassell Done.        LOS: 93 days    Traxton Kolenda 08/07/2017 [phone]

## 2017-08-08 LAB — BASIC METABOLIC PANEL
Anion gap: 9 (ref 5–15)
BUN: 18 mg/dL (ref 6–20)
CHLORIDE: 100 mmol/L — AB (ref 101–111)
CO2: 23 mmol/L (ref 22–32)
Calcium: 8.7 mg/dL — ABNORMAL LOW (ref 8.9–10.3)
Creatinine, Ser: 0.56 mg/dL — ABNORMAL LOW (ref 0.61–1.24)
GFR calc Af Amer: >60 mL/min (ref >60)
GFR calc non Af Amer: >60 mL/min (ref >60)
Glucose, Bld: 136 mg/dL — ABNORMAL HIGH (ref 65–99)
POTASSIUM: 3.5 mmol/L (ref 3.5–5.1)
SODIUM: 132 mmol/L — AB (ref 135–145)

## 2017-08-08 LAB — PHOSPHORUS: PHOSPHORUS: 3.9 mg/dL (ref 2.5–4.6)

## 2017-08-08 LAB — MAGNESIUM: Magnesium: 1.5 mg/dL — ABNORMAL LOW (ref 1.7–2.4)

## 2017-08-08 MED ORDER — TRACE MINERALS CR-CU-MN-SE-ZN 10-1000-500-60 MCG/ML IV SOLN
INTRAVENOUS | Status: AC
  Administered 2017-08-08: 18:00:00 via INTRAVENOUS
  Filled 2017-08-08: qty 2000

## 2017-08-08 MED ORDER — HYDROMORPHONE HCL 1 MG/ML IJ SOLN
1.0000 mg | Freq: Three times a day (TID) | INTRAMUSCULAR | Status: DC | PRN
  Administered 2017-08-08 – 2017-08-18 (×11): 1 mg via INTRAVENOUS
  Filled 2017-08-08 (×14): qty 1

## 2017-08-08 MED ORDER — POTASSIUM CHLORIDE 10 MEQ/50ML IV SOLN
10.0000 meq | INTRAVENOUS | Status: AC
  Administered 2017-08-08 (×6): 10 meq via INTRAVENOUS
  Filled 2017-08-08 (×6): qty 50

## 2017-08-08 MED ORDER — MAGNESIUM SULFATE 4 GM/100ML IV SOLN
4.0000 g | INTRAVENOUS | Status: AC
  Administered 2017-08-08 (×3): 4 g via INTRAVENOUS
  Filled 2017-08-08 (×3): qty 100

## 2017-08-08 NOTE — Progress Notes (Signed)
Patient ID: [name] [name], male   DOB: 29-Jan-1961, 56 y.o.   MRN: [id number] 8 Days Post-Op   Subjective: No new complaints. Pain control is main issue but feeling overall a little better today. Had a bowel movement yesterday. Wife at bedside and feels that he is doing okay.  Objective: Vital signs in last 24 hours: Temp:  [97.9 F (36.6 C)-99.8 F (37.7 C)] 98.5 F (36.9 C) (10/13 0800) Pulse Rate:  [96-108] 105 (10/13 0600) Resp:  [15-20] 17 (10/13 0600) BP: (116-146)/(74-85) 116/76 (10/13 0000) SpO2:  [94 %-100 %] 95 % (10/13 0600) Weight:  [79.6 kg (175 lb 7.8 oz)] 79.6 kg (175 lb 7.8 oz) (10/13 0430) Last BM Date: 08/07/17  Intake/Output from previous day: 10/12 0701 - 10/13 0700 In: 1544.8 [I.V.:694.8; NG/GT:20; IV Piggyback:830] Out: 3700 [Urine:2300; Emesis/NG output:1400] Intake/Output this shift: No intake/output data recorded.  General appearance: cooperative, fatigued and no distress Resp: clear to auscultation bilaterally GI: Nondistended. Fairly soft and minimally tender.  IncisionsRight upper quadrant and midline wounds packed. Some eschar at the base particularly of the right upper quadrant incision but no unusual or copious drainage.   Lab Results:   Recent Labs  08/07/17 0710  WBC 15.5*  HGB 9.3*  HCT 29.1*  PLT 731*   BMET  Recent Labs  08/06/17 0500 08/07/17 0718  NA 133* 132*  K 3.8 3.5  CL 103 99*  CO2 22 23  GLUCOSE 118* 139*  BUN 18 17  CREATININE 0.53* 0.50*  CALCIUM 8.3* 8.4*     Studies/Results: No results found.  Anti-infectives: Anti-infectives    Start     Dose/Rate Route Frequency Ordered Stop   08/01/17 0600  metroNIDAZOLE (FLAGYL) IVPB 500 mg  Status:  Discontinued     500 mg 100 mL/hr over 60 Minutes Intravenous On call to O.R. 07/31/17 1656 07/31/17 1709   08/01/17 0600  ciprofloxacin (CIPRO) IVPB 400 mg  Status:  Discontinued     400 mg 200 mL/hr over 60 Minutes Intravenous On call to O.R. 07/31/17 1656 07/31/17  1709   07/31/17 0556  metroNIDAZOLE (FLAGYL) IVPB 500 mg     500 mg 100 mL/hr over 60 Minutes Intravenous On call to O.R. 07/31/17 0556 07/31/17 0840   07/31/17 0556  ciprofloxacin (CIPRO) IVPB 400 mg     400 mg 200 mL/hr over 60 Minutes Intravenous On call to O.R. 07/31/17 0556 07/31/17 0914   07/08/17 1600  fluconazole (DIFLUCAN) IVPB 100 mg     100 mg 50 mL/hr over 60 Minutes Intravenous Every 24 hours 07/08/17 0828 07/14/17 1710   07/01/17 1500  fluconazole (DIFLUCAN) IVPB 100 mg     100 mg 50 mL/hr over 60 Minutes Intravenous Every 24 hours 07/01/17 1401 07/07/17 1715   06/16/17 1200  piperacillin-tazobactam (ZOSYN) IVPB 3.375 g     3.375 g 12.5 mL/hr over 240 Minutes Intravenous Every 8 hours 06/16/17 1026 06/17/17 0004   06/08/17 1200  piperacillin-tazobactam (ZOSYN) IVPB 3.375 g  Status:  Discontinued     3.375 g 12.5 mL/hr over 240 Minutes Intravenous Every 8 hours 06/08/17 1056 06/16/17 1026   05/29/17 2200  ceFEPIme (MAXIPIME) 2 g in dextrose 5 % 50 mL IVPB  Status:  Discontinued     2 g 100 mL/hr over 30 Minutes Intravenous Every 8 hours 05/29/17 2031 06/03/17 0832   05/29/17 2100  metroNIDAZOLE (FLAGYL) IVPB 500 mg  Status:  Discontinued     500 mg 100 mL/hr over 60 Minutes Intravenous  Every 8 hours 05/29/17 2029 06/03/17 0832   05/26/17 2200  ceFAZolin (ANCEF) IVPB 1 g/50 mL premix  Status:  Discontinued     1 g 100 mL/hr over 30 Minutes Intravenous Every 8 hours 05/26/17 1008 05/29/17 2023   05/25/17 1100  vancomycin (VANCOCIN) 1,250 mg in sodium chloride 0.9 % 250 mL IVPB  Status:  Discontinued     1,250 mg 166.7 mL/hr over 90 Minutes Intravenous Every 12 hours 05/25/17 0955 05/26/17 0946   05/22/17 1400  anidulafungin (ERAXIS) 100 mg in sodium chloride 0.9 % 100 mL IVPB     100 mg 78 mL/hr over 100 Minutes Intravenous Every 24 hours 05/21/17 1330 05/27/17 1740   05/21/17 1400  anidulafungin (ERAXIS) 200 mg in sodium chloride 0.9 % 200 mL IVPB     200 mg 78 mL/hr  over 200 Minutes Intravenous  Once 05/21/17 1330 05/21/17 1940   05/15/17 1000  anidulafungin (ERAXIS) 100 mg in sodium chloride 0.9 % 100 mL IVPB  Status:  Discontinued     100 mg 78 mL/hr over 100 Minutes Intravenous Every 24 hours 05/14/17 0829 05/16/17 0901   05/15/17 1000  metroNIDAZOLE (FLAGYL) IVPB 500 mg  Status:  Discontinued     500 mg 100 mL/hr over 60 Minutes Intravenous Every 8 hours 05/15/17 0903 05/25/17 0938   05/14/17 0900  anidulafungin (ERAXIS) 200 mg in sodium chloride 0.9 % 200 mL IVPB     200 mg 78 mL/hr over 200 Minutes Intravenous  Once 05/14/17 0829 05/14/17 1325   05/13/17 1927  vancomycin (VANCOCIN) 1-5 GM/200ML-% IVPB    Comments:  Ward, Christa   : cabinet override      05/13/17 1927 05/14/17 0744   05/12/17 1400  ceFEPIme (MAXIPIME) 1 g in dextrose 5 % 50 mL IVPB     1 g 100 mL/hr over 30 Minutes Intravenous Every 8 hours 05/12/17 0939 05/26/17 2359   05/12/17 0900  vancomycin (VANCOCIN) IVPB 1000 mg/200 mL premix  Status:  Discontinued     1,000 mg 200 mL/hr over 60 Minutes Intravenous Every 12 hours 05/12/17 0750 05/16/17 0852   05/12/17 0830  aztreonam (AZACTAM) 2 GM IVPB     2 g 100 mL/hr over 30 Minutes Intravenous  Once 05/12/17 0800 05/12/17 0957   05/06/17 0916  vancomycin (VANCOCIN) IVPB 1000 mg/200 mL premix     1,000 mg 200 mL/hr over 60 Minutes Intravenous On call to O.R. 05/06/17 0916 05/06/17 1229      Assessment/Plan: s/p Procedure(s): EXPLORATORY LAPAROTOMY WITH CLOSURE OF ENTEROTOMICS LAPAROSCOPY DIAGNOSTIC LYSIS OF ADHESIONS INSERTION OF MESH Appear stable postoperatively. At bed rest due to tenuous wound closure. Continue NG tube for now. Discussed with patient and wife. Questions answered.   LOS: 94 days    Keilana Morlock T 08/08/2017

## 2017-08-08 NOTE — Progress Notes (Signed)
Palliative care progress note  Reason for visit: pain management  I met today with [name] [name].  Reports that pain is better controlled following PCA adjustment and he seems more interactive today.  He still reports that he gets best pain relief and rest from intermittent dilaudid but it seems to make him sleepy.  The dilaudid 11m dose that he has been getting intermittently is much lower opioid equivalent that his current fentanyl usage, and I think that he may benefit from opioid rotation back to dilaudid.  I discussed this with him and he does not want to try opioid rotation at this time.     Reviewed current regimen which includes: Fentanyl 1742m/hr patch: 420052m24 hours Fentanyl PCA: 2500m74mn last 24 hours Dilaudid 1mg 52m dose  Continue 50mcg42mbasal rate and PCA dose of 50mcg 84m lockout of 20mins.83mI am thinking we may need rotate back to dilaudid, but he is resistant to do so at this time.  I added on dilaudid 1mg ever2m hours at his request, and if he uses these and gets better relief than he has been from PCA, I told him that I think this is clear indication we may need to rotate opioids.  Will plan to f/u again tomorrow.  Total time: 30 minutes Greater than 50%  of this time was spent counseling and coordinating care related to the above assessment and plan.  [name] [name] FreeMicheline Rough HealVander-[phone]

## 2017-08-08 NOTE — Progress Notes (Signed)
[facility] NOTE   Pharmacy Consult for TPN Indication: prolonged ileus, small bowel leak, multiple bowel surgeries  Patient Measurements: Body mass index is 25.18 kg/m. Filed Weights   08/05/17 0416 08/06/17 1147 08/08/17 0430  Weight: 183 lb 13.8 oz (83.4 kg) 176 lb 9.4 oz (80.1 kg) 175 lb 7.8 oz (79.6 kg)   HPI: 73 yoM admitted on 7/11 for enlarging adrenal mass and planned adrenalectomy.  Pharmacy consulted to dose TPN.  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   7/22 OR:  wash out, tube ileostomy, left with open abdomen & wound vac in place.  Plan for OR on 7/25 for closure 7/25 OR: mesh placed for abdominal closure, wound vac 8/2 extubated, propofol drip off 8/4 TPN off for ~ 5 hours due to line detached from filter 8/10 MD request decrease rate x 1 week 2nd low sodium 8/12 Additional sodium added to TPN due to persistent hyponatremia 8/16 Increasing rate back to goal since sodium has improved with above intervention.   8/20 Prealbumin decreased.  New apparent enteric leak per surgery notes. 8/21 CaPhos product elevated, remove electrolytes from TPN 8/25 PICC did not have blood return in 2 of the 3 ports.  Alteplase intra-catheter and function returned.  TPN infusing correctly on 8/26 per RN. 8/28: d/c SSI 8/30: added electrolytes to TPN 8/31: TPN IV line came out around ~0340 and current bag was stopped at this time.  Restart new clinimix 5/15 1L bag (no lytes since phos is elevated this morning) until next bag is due at [facility]. Will not add MVI and trace elements to this 1L  bag. Use electrolyte free clinimix with new bag at [facility] 9/1 NGT out 9/3 added electrolytes to TPN 9/4 lytes out of TPN 9/7 cycle over 18 hrs 9/8 add back electrolytes to TPN.  Cycle over 14 hours 9/9 lytes out of TPN, continue 14 hr cycle, DC SSI/CBGs 9/13 electrolytes added back to TPN 9/14 remove electrolytes from TPN 10/3 Adjusted estimated needs based on prealbumin (decreased on 9/24, low but slightly improved on 10/1) and attempt to maximize nutrition prior to surgery planned for 07/31/17 - required increase of cycle to 16 hours. 10/5 to OR for fistula-repair surgery 10/6: Lytes added back to TPN with low Phos 10/10 D/C CBGs  Insulin requirements past 24 hours: 2 units in 24 hrs (moderate SSI)   Current Nutrition: NPO  IVF: none  Central access: PICC line ordered 7/19, placed on 7/20 TPN start date: 7/20  ASSESSMENT                                                                                                           Today, 08/08/17  - Glucose:  (goal  100-150) Decadron perioperatively 10/5 and had SBG > 150, so CCS added SSI. Subsequent CBG < 150.  Low CBG 88 on 10/9 while TPN was off, CBG 124 while TPN infusing at max rate.  Very little SSI required. - Electrolytes:  K 3.5  - daily IV replacement  Mag 1.5  - daily IV replacement  Phos 3.9, improved with lytes removed from TPN (were added back into TPN on 10/6-10/7)  Na decreased to 132 despite adding sodium in TPN.  Corr Ca 10.14; Ca/phos product 39 (goal < 55) - Renal:  SCr wnl, stable  -NGT output - 142m.  Weight looks OK, down vs last 4 days  - + BMs - LFTs:  WNL except Alk Phos which is elevated, trending up - TGs:  Slightly elevated prior to starting TPN, 145 on 10/8 - Prealbumin: Low on admission but trended up and remained at or near goal during Aug to Sep; now decreased to < 5 (10/8).    NUTRITIONAL GOALS                                                                                             RD recs  (10/8):  137-153 gms Protein (1.7 - 1.9 g/kg) 2030 - 2270 Kcal (25-28 kcal/kg)  Recalculation (10/3): Clinimix 5/15 3000 mL over 16h cycle with lipids 3x/week on MWF only would provide: 150 g protein and avg 2335 kcal/day  Glucose infusion rate will be 6.5 mg/kg/min at max cyclic rate. (Maximum 7 mg/kg/min)  Electrolyte goals:  K >/= 4.0; Mg >/= 2.0 with ileus  PLAN                                                                                                                         Now:  KCl 10 mEq IV x 6  Mag 4g IV x3  At 1800 today:  Continue Clinimix 5/15 (electrolyte free) 3000 mL/day cycled over 16 hr  50 ml/hr x 1 hr (6p-7p), 207 ml/hr x 12 hr (7p-9a), 50 ml/hr x 1 hr (9a-10a), then off  Continue sodium 125 mEq/L in TPN: add as 70 mEq/L NaCl and 55 mEq/L Na Acetate   Will consider increasing to Normal Saline if Na continues to decrease  Lipids at 20 mls/hr x 12 hours 3 times weekly on MWF  Standard MVI, Trace elements, and pepcid 40 mg/day in TPN  D/C CBGs and SSI as his CBGs remain well controlled.   TPN lab panels on Mondays & Thursdays.   BMET, Mag & Phos daily ordered.  DDoreene Eland PharmD, BCPS.   Pager: [phone]/13/2018 10:06 AM

## 2017-08-09 LAB — BASIC METABOLIC PANEL
Anion gap: 9 (ref 5–15)
BUN: 18 mg/dL (ref 6–20)
CALCIUM: 8.5 mg/dL — AB (ref 8.9–10.3)
CO2: 24 mmol/L (ref 22–32)
CREATININE: 0.51 mg/dL — AB (ref 0.61–1.24)
Chloride: 101 mmol/L (ref 101–111)
GFR calc Af Amer: >60 mL/min (ref >60)
GLUCOSE: 129 mg/dL — AB (ref 65–99)
POTASSIUM: 3.5 mmol/L (ref 3.5–5.1)
SODIUM: 134 mmol/L — AB (ref 135–145)

## 2017-08-09 LAB — PHOSPHORUS: PHOSPHORUS: 4.8 mg/dL — AB (ref 2.5–4.6)

## 2017-08-09 LAB — MAGNESIUM: MAGNESIUM: 2 mg/dL (ref 1.7–2.4)

## 2017-08-09 MED ORDER — TRACE MINERALS CR-CU-MN-SE-ZN 10-1000-500-60 MCG/ML IV SOLN
INTRAVENOUS | Status: AC
  Administered 2017-08-09: 19:00:00 via INTRAVENOUS
  Filled 2017-08-09: qty 3000
  Filled 2017-08-09: qty 2000

## 2017-08-09 MED ORDER — CHLORHEXIDINE GLUCONATE CLOTH 2 % EX PADS
6.0000 | MEDICATED_PAD | Freq: Every day | CUTANEOUS | Status: DC
  Administered 2017-08-09 – 2017-08-10 (×2): 6 via TOPICAL

## 2017-08-09 MED ORDER — TRACE MINERALS CR-CU-MN-SE-ZN 10-1000-500-60 MCG/ML IV SOLN
INTRAVENOUS | Status: DC
  Filled 2017-08-09: qty 3000

## 2017-08-09 MED ORDER — POTASSIUM CHLORIDE 10 MEQ/50ML IV SOLN
10.0000 meq | INTRAVENOUS | Status: AC
  Administered 2017-08-09 (×6): 10 meq via INTRAVENOUS
  Filled 2017-08-09 (×6): qty 50

## 2017-08-09 MED ORDER — MAGNESIUM SULFATE 4 GM/100ML IV SOLN
4.0000 g | INTRAVENOUS | Status: AC
  Administered 2017-08-09 (×3): 4 g via INTRAVENOUS
  Filled 2017-08-09 (×3): qty 100

## 2017-08-09 NOTE — Progress Notes (Addendum)
[facility] NOTE   Pharmacy Consult for TPN Indication: prolonged ileus, small bowel leak, multiple bowel surgeries  Patient Measurements: Body mass index is 25.18 kg/m. Filed Weights   08/05/17 0416 08/06/17 1147 08/08/17 0430  Weight: 183 lb 13.8 oz (83.4 kg) 176 lb 9.4 oz (80.1 kg) 175 lb 7.8 oz (79.6 kg)   HPI: 45 yoM admitted on 7/11 for enlarging adrenal mass and planned adrenalectomy.  Pharmacy consulted to dose TPN.  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   7/22 OR:  wash out, tube ileostomy, left with open abdomen & wound vac in place.  Plan for OR on 7/25 for closure 7/25 OR: mesh placed for abdominal closure, wound vac 8/2 extubated, propofol drip off 8/4 TPN off for ~ 5 hours due to line detached from filter 8/10 MD request decrease rate x 1 week 2nd low sodium 8/12 Additional sodium added to TPN due to persistent hyponatremia 8/16 Increasing rate back to goal since sodium has improved with above intervention.   8/20 Prealbumin decreased.  New apparent enteric leak per surgery notes. 8/21 CaPhos product elevated, remove electrolytes from TPN 8/25 PICC did not have blood return in 2 of the 3 ports.  Alteplase intra-catheter and function returned.  TPN infusing correctly on 8/26 per RN. 8/28: d/c SSI 8/30: added electrolytes to TPN 8/31: TPN IV line came out around ~0340 and current bag was stopped at this time.  Restart new clinimix 5/15 1L bag (no lytes since phos is elevated this morning) until next bag is due at [facility]. Will not add MVI and trace elements to this 1L  bag. Use electrolyte free clinimix with new bag at [facility] 9/1 NGT out 9/3 added electrolytes to TPN 9/4 lytes out of TPN 9/7 cycle over 18 hrs 9/8 add back electrolytes to TPN.  Cycle over 14 hours 9/9 lytes out of TPN, continue 14 hr cycle, DC SSI/CBGs 9/13 electrolytes added back to TPN 9/14 remove electrolytes from TPN 10/3 Adjusted estimated needs based on prealbumin (decreased on 9/24, low but slightly improved on 10/1) and attempt to maximize nutrition prior to surgery planned for 07/31/17 - required increase of cycle to 16 hours. 10/5 to OR for fistula-repair surgery 10/6: Lytes added back to TPN with low Phos 10/10 D/C CBGs 10/14 NGT clamp trial  Insulin requirements past 24 hours: 2 units in 24 hrs (moderate SSI)   Current Nutrition: NPO  IVF: none  Central access: PICC line ordered 7/19, placed on 7/20 TPN start date: 7/20  ASSESSMENT                                                                                                           Today, 08/09/17  -  Glucose:  (goal 100-150) Decadron perioperatively 10/5 and had SBG > 150, so CCS added SSI. Subsequent CBG < 150.  Low CBG 88 on 10/9 while TPN was off, CBG 124 while TPN infusing at max rate.  Very little SSI required. - Electrolytes:  K 3.5  - daily IV replacement  Mag 2  - daily IV replacement  Phos 4.8, increased but lytes already removed from TPN (were added back into TPN on 10/6-10/7)  Na slightly decreased - 134  sodium in TPN.  Corr Ca 9.94; Ca/phos product 47 (goal < 55) - Renal:  SCr wnl, stable  -NGT output - none documented.  Weight looks OK  - + BMs - LFTs:  WNL except Alk Phos which is elevated, trending up - TGs:  Slightly elevated prior to starting TPN, 145 on 10/8 - Prealbumin: Low on admission but trended up and remained at or near goal during Aug to Sep; now decreased to < 5 (10/8).    NUTRITIONAL GOALS                                                                                              RD recs (10/8):  137-153 gms Protein (1.7 - 1.9 g/kg) 2030 - 2270 Kcal (25-28 kcal/kg)  Recalculation (10/3): Clinimix 5/15 3000 mL over 16h cycle with lipids 3x/week on MWF only would provide: 150 g protein and avg 2335 kcal/day  Glucose infusion rate will be 6.5 mg/kg/min at max cyclic rate. (Maximum 7 mg/kg/min)  Electrolyte goals:  K >/= 4.0; Mg >/= 2.0 with ileus  PLAN                                                                                                                         Now:  KCl 10 mEq IV x 6  Mag 4g IV x3  At 1800 today:  Continue Clinimix 5/15 (electrolyte free) 3000 mL/day cycled over 16 hr  50 ml/hr x 1 hr (6p-7p), 207 ml/hr x 12 hr (7p-9a), 50 ml/hr x 1 hr (9a-10a), then off  Continue sodium 125 mEq/L in TPN: add as 70 mEq/L NaCl and 55 mEq/L Na Acetate   Lipids at 20 mls/hr x 12 hours 3 times weekly on MWF  Standard MVI, Trace elements, and pepcid 40 mg/day in TPN  Pepcid and IV protonix for coffee ground material from NGT in early August.  In October had some blood in NGT  D/C CBGs and SSI as his CBGs remain well controlled.   TPN lab panels on Mondays & Thursdays.   BMET, Mag & Phos daily ordered.  Doreene Eland,  PharmD, BCPS.   Pager: [phone] 08/09/2017 7:19 AM

## 2017-08-09 NOTE — Progress Notes (Signed)
Patient ID: [name] [name], male   DOB: 11-Jun-1961, 56 y.o.   MRN: [id number] 9 Days Post-Op   Subjective: No events overnight. Still requiring a lot of pain medication and 1 additional dose of Dilaudid for breakthrough. No bowel movements yesterday. Tolerated intermittent NG clamping without nausea or distention.  Objective: Vital signs in last 24 hours: Temp:  [97.8 F (36.6 C)-98.5 F (36.9 C)] 98.4 F (36.9 C) (10/14 0800) Pulse Rate:  [94-107] 107 (10/14 0800) Resp:  [14-22] 20 (10/14 0800) BP: (112-154)/(64-110) 132/110 (10/14 0800) SpO2:  [91 %-99 %] 99 % (10/14 0800) Last BM Date: 08/07/17  Intake/Output from previous day: 10/13 0701 - 10/14 0700 In: 716 [I.V.:36; IV Piggyback:680] Out: 1785 [Urine:1285; Emesis/NG output:500] Intake/Output this shift: No intake/output data recorded.  General appearance: alert, cooperative and no distress GI: soft without apparent tenderness and nondistended Incision/Wound: wound carefully examined at dressing change. Midline wound essentially all healthy granulation. There is some mild separation of his right flank wound with eschar at the base. No unusual drainage.  Lab Results:   Recent Labs  08/07/17 0710  WBC 15.5*  HGB 9.3*  HCT 29.1*  PLT 731*   BMET  Recent Labs  08/08/17 0823 08/09/17 0342  NA 132* 134*  K 3.5 3.5  CL 100* 101  CO2 23 24  GLUCOSE 136* 129*  BUN 18 18  CREATININE 0.56* 0.51*  CALCIUM 8.7* 8.5*     Studies/Results: No results found.  Anti-infectives: Anti-infectives    Start     Dose/Rate Route Frequency Ordered Stop   08/01/17 0600  metroNIDAZOLE (FLAGYL) IVPB 500 mg  Status:  Discontinued     500 mg 100 mL/hr over 60 Minutes Intravenous On call to O.R. 07/31/17 1656 07/31/17 1709   08/01/17 0600  ciprofloxacin (CIPRO) IVPB 400 mg  Status:  Discontinued     400 mg 200 mL/hr over 60 Minutes Intravenous On call to O.R. 07/31/17 1656 07/31/17 1709   07/31/17 0556  metroNIDAZOLE (FLAGYL)  IVPB 500 mg     500 mg 100 mL/hr over 60 Minutes Intravenous On call to O.R. 07/31/17 0556 07/31/17 0840   07/31/17 0556  ciprofloxacin (CIPRO) IVPB 400 mg     400 mg 200 mL/hr over 60 Minutes Intravenous On call to O.R. 07/31/17 0556 07/31/17 0914   07/08/17 1600  fluconazole (DIFLUCAN) IVPB 100 mg     100 mg 50 mL/hr over 60 Minutes Intravenous Every 24 hours 07/08/17 0828 07/14/17 1710   07/01/17 1500  fluconazole (DIFLUCAN) IVPB 100 mg     100 mg 50 mL/hr over 60 Minutes Intravenous Every 24 hours 07/01/17 1401 07/07/17 1715   06/16/17 1200  piperacillin-tazobactam (ZOSYN) IVPB 3.375 g     3.375 g 12.5 mL/hr over 240 Minutes Intravenous Every 8 hours 06/16/17 1026 06/17/17 0004   06/08/17 1200  piperacillin-tazobactam (ZOSYN) IVPB 3.375 g  Status:  Discontinued     3.375 g 12.5 mL/hr over 240 Minutes Intravenous Every 8 hours 06/08/17 1056 06/16/17 1026   05/29/17 2200  ceFEPIme (MAXIPIME) 2 g in dextrose 5 % 50 mL IVPB  Status:  Discontinued     2 g 100 mL/hr over 30 Minutes Intravenous Every 8 hours 05/29/17 2031 06/03/17 0832   05/29/17 2100  metroNIDAZOLE (FLAGYL) IVPB 500 mg  Status:  Discontinued     500 mg 100 mL/hr over 60 Minutes Intravenous Every 8 hours 05/29/17 2029 06/03/17 0832   05/26/17 2200  ceFAZolin (ANCEF) IVPB 1 g/50 mL  premix  Status:  Discontinued     1 g 100 mL/hr over 30 Minutes Intravenous Every 8 hours 05/26/17 1008 05/29/17 2023   05/25/17 1100  vancomycin (VANCOCIN) 1,250 mg in sodium chloride 0.9 % 250 mL IVPB  Status:  Discontinued     1,250 mg 166.7 mL/hr over 90 Minutes Intravenous Every 12 hours 05/25/17 0955 05/26/17 0946   05/22/17 1400  anidulafungin (ERAXIS) 100 mg in sodium chloride 0.9 % 100 mL IVPB     100 mg 78 mL/hr over 100 Minutes Intravenous Every 24 hours 05/21/17 1330 05/27/17 1740   05/21/17 1400  anidulafungin (ERAXIS) 200 mg in sodium chloride 0.9 % 200 mL IVPB     200 mg 78 mL/hr over 200 Minutes Intravenous  Once 05/21/17  1330 05/21/17 1940   05/15/17 1000  anidulafungin (ERAXIS) 100 mg in sodium chloride 0.9 % 100 mL IVPB  Status:  Discontinued     100 mg 78 mL/hr over 100 Minutes Intravenous Every 24 hours 05/14/17 0829 05/16/17 0901   05/15/17 1000  metroNIDAZOLE (FLAGYL) IVPB 500 mg  Status:  Discontinued     500 mg 100 mL/hr over 60 Minutes Intravenous Every 8 hours 05/15/17 0903 05/25/17 0938   05/14/17 0900  anidulafungin (ERAXIS) 200 mg in sodium chloride 0.9 % 200 mL IVPB     200 mg 78 mL/hr over 200 Minutes Intravenous  Once 05/14/17 0829 05/14/17 1325   05/13/17 1927  vancomycin (VANCOCIN) 1-5 GM/200ML-% IVPB    Comments:  Ward, Christa   : cabinet override      05/13/17 1927 05/14/17 0744   05/12/17 1400  ceFEPIme (MAXIPIME) 1 g in dextrose 5 % 50 mL IVPB     1 g 100 mL/hr over 30 Minutes Intravenous Every 8 hours 05/12/17 0939 05/26/17 2359   05/12/17 0900  vancomycin (VANCOCIN) IVPB 1000 mg/200 mL premix  Status:  Discontinued     1,000 mg 200 mL/hr over 60 Minutes Intravenous Every 12 hours 05/12/17 0750 05/16/17 0852   05/12/17 0830  aztreonam (AZACTAM) 2 GM IVPB     2 g 100 mL/hr over 30 Minutes Intravenous  Once 05/12/17 0800 05/12/17 0957   05/06/17 0916  vancomycin (VANCOCIN) IVPB 1000 mg/200 mL premix     1,000 mg 200 mL/hr over 60 Minutes Intravenous On call to O.R. 05/06/17 0916 05/06/17 1229      Assessment/Plan: s/p Procedure(s): EXPLORATORY LAPAROTOMY WITH CLOSURE OF ENTEROTOMICS LAPAROSCOPY DIAGNOSTIC LYSIS OF ADHESIONS INSERTION OF MESH Stable postoperatively without apparent complication. Try NG clamped continuously today. Continue TNA. Continue bed rest.   LOS: 95 days    [name] [name] 08/09/2017

## 2017-08-10 LAB — COMPREHENSIVE METABOLIC PANEL
ALT: 35 U/L (ref 17–63)
ANION GAP: 8 (ref 5–15)
AST: 28 U/L (ref 15–41)
Albumin: 2.5 g/dL — ABNORMAL LOW (ref 3.5–5.0)
Alkaline Phosphatase: 279 U/L — ABNORMAL HIGH (ref 38–126)
BILIRUBIN TOTAL: 0.3 mg/dL (ref 0.3–1.2)
BUN: 20 mg/dL (ref 6–20)
CHLORIDE: 105 mmol/L (ref 101–111)
CO2: 22 mmol/L (ref 22–32)
Calcium: 9 mg/dL (ref 8.9–10.3)
Creatinine, Ser: 0.55 mg/dL — ABNORMAL LOW (ref 0.61–1.24)
GFR calc Af Amer: >60 mL/min (ref >60)
Glucose, Bld: 139 mg/dL — ABNORMAL HIGH (ref 65–99)
POTASSIUM: 3.9 mmol/L (ref 3.5–5.1)
Sodium: 135 mmol/L (ref 135–145)
TOTAL PROTEIN: 6.7 g/dL (ref 6.5–8.1)

## 2017-08-10 LAB — DIFFERENTIAL
BASOS ABS: 0 10*3/uL (ref 0.0–0.1)
BASOS PCT: 0 %
Eosinophils Absolute: 0.5 10*3/uL (ref 0.0–0.7)
Eosinophils Relative: 4 %
LYMPHS ABS: 3.4 10*3/uL (ref 0.7–4.0)
Lymphocytes Relative: 24 %
MONOS PCT: 7 %
Monocytes Absolute: 1 10*3/uL (ref 0.1–1.0)
NEUTROS ABS: 9.3 10*3/uL — AB (ref 1.7–7.7)
Neutrophils Relative %: 65 %

## 2017-08-10 LAB — PHOSPHORUS: PHOSPHORUS: 4.7 mg/dL — AB (ref 2.5–4.6)

## 2017-08-10 LAB — CBC
HEMATOCRIT: 28.5 % — AB (ref 39.0–52.0)
HEMOGLOBIN: 9.1 g/dL — AB (ref 13.0–17.0)
MCH: 24.6 pg — ABNORMAL LOW (ref 26.0–34.0)
MCHC: 31.9 g/dL (ref 30.0–36.0)
MCV: 77 fL — ABNORMAL LOW (ref 78.0–100.0)
Platelets: 897 10*3/uL — ABNORMAL HIGH (ref 150–400)
RBC: 3.7 MIL/uL — AB (ref 4.22–5.81)
RDW: 18.6 % — ABNORMAL HIGH (ref 11.5–15.5)
WBC: 14.2 10*3/uL — AB (ref 4.0–10.5)

## 2017-08-10 LAB — TRIGLYCERIDES: TRIGLYCERIDES: 148 mg/dL (ref <150)

## 2017-08-10 LAB — MAGNESIUM: MAGNESIUM: 1.6 mg/dL — AB (ref 1.7–2.4)

## 2017-08-10 LAB — PREALBUMIN: Prealbumin: 15.6 mg/dL — ABNORMAL LOW (ref 18–38)

## 2017-08-10 MED ORDER — TRACE MINERALS CR-CU-MN-SE-ZN 10-1000-500-60 MCG/ML IV SOLN
INTRAVENOUS | Status: AC
  Administered 2017-08-10: 18:00:00 via INTRAVENOUS
  Filled 2017-08-10: qty 1000

## 2017-08-10 MED ORDER — NYSTATIN 100000 UNIT/ML MT SUSP
5.0000 mL | Freq: Four times a day (QID) | OROMUCOSAL | Status: DC
  Administered 2017-08-10 (×4): 500000 [IU] via OROMUCOSAL
  Filled 2017-08-10 (×4): qty 5

## 2017-08-10 MED ORDER — ALTEPLASE 2 MG IJ SOLR
2.0000 mg | Freq: Once | INTRAMUSCULAR | Status: AC
  Administered 2017-08-10: 2 mg
  Filled 2017-08-10: qty 2

## 2017-08-10 MED ORDER — POTASSIUM CHLORIDE 10 MEQ/50ML IV SOLN
10.0000 meq | INTRAVENOUS | Status: AC
  Administered 2017-08-10 (×4): 10 meq via INTRAVENOUS
  Filled 2017-08-10 (×4): qty 50

## 2017-08-10 MED ORDER — MAGNESIUM SULFATE 4 GM/100ML IV SOLN
4.0000 g | INTRAVENOUS | Status: AC
  Administered 2017-08-10 (×3): 4 g via INTRAVENOUS
  Filled 2017-08-10 (×3): qty 100

## 2017-08-10 MED ORDER — CHLORHEXIDINE GLUCONATE CLOTH 2 % EX PADS
6.0000 | MEDICATED_PAD | Freq: Every day | CUTANEOUS | Status: DC
  Administered 2017-08-11 – 2017-08-16 (×6): 6 via TOPICAL

## 2017-08-10 MED ORDER — FAT EMULSION 20 % IV EMUL
240.0000 mL | INTRAVENOUS | Status: AC
  Administered 2017-08-10: 240 mL via INTRAVENOUS
  Filled 2017-08-10: qty 250

## 2017-08-10 NOTE — Progress Notes (Signed)
10 Days Post-Op    CC:  Adrenal mass with post op bowel dehiscence    Subjective: No nausea with the NG clamped, +BS, No Bm yesterday.  Midline incision OK, Mesh showing lateral right side incision.  Complaining of oral thrush.  Objective: Vital signs in last 24 hours: Temp:  [97.9 F (36.6 C)-99.3 F (37.4 C)] 97.9 F (36.6 C) (10/15 0342) Pulse Rate:  [95-107] 102 (10/15 0700) Resp:  [13-24] 24 (10/15 0700) BP: (104-132)/(74-110) 108/74 (10/15 0400) SpO2:  [94 %-100 %] 98 % (10/15 0700) Last BM Date: 08/07/17 2260 IV Urine 2715 No BM  NO NG drainage Afebrile, VSS HR up some Labs OK no real change Last CXR/film 07/31/17 Intake/Output from previous day: 10/14 0701 - 10/15 0700 In: 2260.9 [I.V.:2200.9; IV Piggyback:60] Out: 2715 [Urine:2715] Intake/Output this shift: Total I/O In: 326 [I.V.:326] Out: 180 [Urine:180]  General appearance: alert, cooperative and no distress Resp: clear to auscultation bilaterally GI: open wound midline is cleaner, the right lateral wound is now showing mesh.  + BS, NO BM, NG clamped since yesterday.  Lab Results:   Recent Labs  08/10/17 0619  WBC 14.2*  HGB 9.1*  HCT 28.5*  PLT 897*    BMET  Recent Labs  08/09/17 0342 08/10/17 0619  NA 134* 135  K 3.5 3.9  CL 101 105  CO2 24 22  GLUCOSE 129* 139*  BUN 18 20  CREATININE 0.51* 0.55*  CALCIUM 8.5* 9.0   PT/INR No results for input(s): LABPROT, INR in the last 72 hours.   Recent Labs Lab 08/06/17 0500 08/10/17 0619  AST 18 28  ALT 20 35  ALKPHOS 216* 279*  BILITOT 0.4 0.3  PROT 6.1* 6.7  ALBUMIN 2.2* 2.5*     Lipase     Component Value Date/Time   LIPASE 27 09/15/2015 0934     Medications: . alteplase  2 mg Intracatheter Once  . Chlorhexidine Gluconate Cloth  6 each Topical Q0600  . diazepam  5 mg Intravenous Q8H  . enoxaparin (LOVENOX) injection  40 mg Subcutaneous Q24H  . fentaNYL  175 mcg Transdermal Q72H  . fentaNYL   Intravenous Q4H  .  lidocaine  2 patch Transdermal Q24H  . magic mouthwash  5 mL Oral TID AC & HS  . metoCLOPramide (REGLAN) injection  10 mg Intravenous Q8H  . nicotine  14 mg Transdermal Daily  . OLANZapine zydis  5 mg Oral QHS  . pantoprazole (PROTONIX) IV  40 mg Intravenous BID   . methocarbamol (ROBAXIN)  IV Stopped (08/10/17 0317)  . ondansetron (ZOFRAN) IV Stopped (08/02/17 2340)  . TPN (CLINIMIX) Adult without lytes 207 mL/hr at 08/10/17 0600   Anti-infectives    Start     Dose/Rate Route Frequency Ordered Stop   08/01/17 0600  metroNIDAZOLE (FLAGYL) IVPB 500 mg  Status:  Discontinued     500 mg 100 mL/hr over 60 Minutes Intravenous On call to O.R. 07/31/17 1656 07/31/17 1709   08/01/17 0600  ciprofloxacin (CIPRO) IVPB 400 mg  Status:  Discontinued     400 mg 200 mL/hr over 60 Minutes Intravenous On call to O.R. 07/31/17 1656 07/31/17 1709   07/31/17 0556  metroNIDAZOLE (FLAGYL) IVPB 500 mg     500 mg 100 mL/hr over 60 Minutes Intravenous On call to O.R. 07/31/17 0350 07/31/17 0840   07/31/17 0556  ciprofloxacin (CIPRO) IVPB 400 mg     400 mg 200 mL/hr over 60 Minutes Intravenous On call to O.R. 07/31/17  3329 07/31/17 0914   07/08/17 1600  fluconazole (DIFLUCAN) IVPB 100 mg     100 mg 50 mL/hr over 60 Minutes Intravenous Every 24 hours 07/08/17 0828 07/14/17 1710   07/01/17 1500  fluconazole (DIFLUCAN) IVPB 100 mg     100 mg 50 mL/hr over 60 Minutes Intravenous Every 24 hours 07/01/17 1401 07/07/17 1715   06/16/17 1200  piperacillin-tazobactam (ZOSYN) IVPB 3.375 g     3.375 g 12.5 mL/hr over 240 Minutes Intravenous Every 8 hours 06/16/17 1026 06/17/17 0004   06/08/17 1200  piperacillin-tazobactam (ZOSYN) IVPB 3.375 g  Status:  Discontinued     3.375 g 12.5 mL/hr over 240 Minutes Intravenous Every 8 hours 06/08/17 1056 06/16/17 1026   05/29/17 2200  ceFEPIme (MAXIPIME) 2 g in dextrose 5 % 50 mL IVPB  Status:  Discontinued     2 g 100 mL/hr over 30 Minutes Intravenous Every 8 hours 05/29/17  2031 06/03/17 0832   05/29/17 2100  metroNIDAZOLE (FLAGYL) IVPB 500 mg  Status:  Discontinued     500 mg 100 mL/hr over 60 Minutes Intravenous Every 8 hours 05/29/17 2029 06/03/17 0832   05/26/17 2200  ceFAZolin (ANCEF) IVPB 1 g/50 mL premix  Status:  Discontinued     1 g 100 mL/hr over 30 Minutes Intravenous Every 8 hours 05/26/17 1008 05/29/17 2023   05/25/17 1100  vancomycin (VANCOCIN) 1,250 mg in sodium chloride 0.9 % 250 mL IVPB  Status:  Discontinued     1,250 mg 166.7 mL/hr over 90 Minutes Intravenous Every 12 hours 05/25/17 0955 05/26/17 0946   05/22/17 1400  anidulafungin (ERAXIS) 100 mg in sodium chloride 0.9 % 100 mL IVPB     100 mg 78 mL/hr over 100 Minutes Intravenous Every 24 hours 05/21/17 1330 05/27/17 1740   05/21/17 1400  anidulafungin (ERAXIS) 200 mg in sodium chloride 0.9 % 200 mL IVPB     200 mg 78 mL/hr over 200 Minutes Intravenous  Once 05/21/17 1330 05/21/17 1940   05/15/17 1000  anidulafungin (ERAXIS) 100 mg in sodium chloride 0.9 % 100 mL IVPB  Status:  Discontinued     100 mg 78 mL/hr over 100 Minutes Intravenous Every 24 hours 05/14/17 0829 05/16/17 0901   05/15/17 1000  metroNIDAZOLE (FLAGYL) IVPB 500 mg  Status:  Discontinued     500 mg 100 mL/hr over 60 Minutes Intravenous Every 8 hours 05/15/17 0903 05/25/17 0938   05/14/17 0900  anidulafungin (ERAXIS) 200 mg in sodium chloride 0.9 % 200 mL IVPB     200 mg 78 mL/hr over 200 Minutes Intravenous  Once 05/14/17 0829 05/14/17 1325   05/13/17 1927  vancomycin (VANCOCIN) 1-5 GM/200ML-% IVPB    Comments:  Ward, Christa   : cabinet override      05/13/17 1927 05/14/17 0744   05/12/17 1400  ceFEPIme (MAXIPIME) 1 g in dextrose 5 % 50 mL IVPB     1 g 100 mL/hr over 30 Minutes Intravenous Every 8 hours 05/12/17 0939 05/26/17 2359   05/12/17 0900  vancomycin (VANCOCIN) IVPB 1000 mg/200 mL premix  Status:  Discontinued     1,000 mg 200 mL/hr over 60 Minutes Intravenous Every 12 hours 05/12/17 0750 05/16/17 0852    05/12/17 0830  aztreonam (AZACTAM) 2 GM IVPB     2 g 100 mL/hr over 30 Minutes Intravenous  Once 05/12/17 0800 05/12/17 0957   05/06/17 0916  vancomycin (VANCOCIN) IVPB 1000 mg/200 mL premix     1,000 mg 200 mL/hr  over 60 Minutes Intravenous On call to O.R. 05/06/17 0916 05/06/17 1229      Assessment/Plan Adrenal mass/hx of prior hemicolectomy/ileocolic anastomosis 0932 1. S/p laparoscopic converted to open right adrenalectomy, small bowel resection with anastomosis, placement of 20cm^2 wound vac, 05/06/17, Dr. Lurena Joiner [name] 2. Post op wound dehiscence - primary repair of small bowel anastomotic leak, abdominal closure of wound dehisence, 05/13/17, Luke [name] 3. S/p reopening of recent laparotomy, lysis of adhesions, small bowel resection, placement 200cm^2 negative pressure dressing, 05/14/17, Luke [name] 4. S/p reopening of recent laparotomy, creation of tube ileostomy, placement of 100cm2 negative pressure dressing, 05/17/17, Luke [name] 5. S/p abdominal wash out, placement of mesh for abdominal closure, placement of wound vac of 100cm^2, 05/20/17, Luke [name] 6. S/p Bronchoscopy 05/22/17 Dr. Simonne Maffucci 7. 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 Tobacco use FEN: NPO/ice chips/cyclic TNA - started 67/1/24 - pharmacy adjusting electrolytes/Phos ID: Vancomycin 7/11- 05/25/17, Maixpime 7/17-7/31/18, Eraxis 7/19-05/27/17, Diflucan 9/5-9/14,  Cipro/ Flagyl pre-op 07/31/17 DVT: Lovenox Foley:  bed rest Follow up: Hassell Done  PLan:  Appreciate Palliative assist with pain issue.  Will check on NG and plans for starting PO's with Dr. Hassell Done.  Dr. Hassell Done will need to decide on bedrest vs OOB.  He is on Magic mouthwash for Oral thrush, wants to go back to Nystatin.  Will ask about starting some Diflucan.    LOS: 96 days    [name] [name] 08/10/2017 [phone]

## 2017-08-10 NOTE — Progress Notes (Signed)
[place] NOTE   Pharmacy Consult for TPN Indication: prolonged ileus, small bowel leak, multiple bowel surgeries  Patient Measurements: Body mass index is 25.18 kg/m. Filed Weights   08/05/17 0416 08/06/17 1147 08/08/17 0430  Weight: 183 lb 13.8 oz (83.4 kg) 176 lb 9.4 oz (80.1 kg) 175 lb 7.8 oz (79.6 kg)   HPI: 76 yoM admitted on 7/11 for enlarging adrenal mass and planned adrenalectomy.  Pharmacy consulted to dose TPN.  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   7/22 OR:  wash out, tube ileostomy, left with open abdomen & wound vac in place.  Plan for OR on 7/25 for closure 7/25 OR: mesh placed for abdominal closure, wound vac 8/2 extubated, propofol drip off 8/4 TPN off for ~ 5 hours due to line detached from filter 8/10 MD request decrease rate x 1 week 2nd low sodium 8/12 Additional sodium added to TPN due to persistent hyponatremia 8/16 Increasing rate back to goal since sodium has improved with above intervention.   8/20 Prealbumin decreased.  New apparent enteric leak per surgery notes. 8/21 CaPhos product elevated, remove electrolytes from TPN 8/25 PICC did not have blood return in 2 of the 3 ports.  Alteplase intra-catheter and function returned.  TPN infusing correctly on 8/26 per RN. 8/28: d/c SSI 8/30: added electrolytes to TPN 8/31: TPN IV line came out around ~0340 and current bag was stopped at this time.  Restart new clinimix 5/15 1L bag (no lytes since phos is elevated this morning) until next bag is due at [facility]. Will not add MVI and trace elements to this 1L  bag. Use electrolyte free clinimix with new bag at [facility] 9/1 NGT out 9/3 added electrolytes to TPN 9/4 lytes out of TPN 9/7 cycle over 18 hrs 9/8 add back electrolytes to TPN.  Cycle over 14 hours 9/9 lytes out of TPN, continue 14 hr cycle, DC SSI/CBGs 9/13 electrolytes added back to TPN 9/14 remove electrolytes from TPN 10/3 Adjusted estimated needs based on prealbumin (decreased on 9/24, low but slightly improved on 10/1) and attempt to maximize nutrition prior to surgery planned for 07/31/17 - required increase of cycle to 16 hours. 10/5 to OR for fistula-repair surgery 10/6: Lytes added back to TPN with low Phos 10/10 D/C CBGs 10/14 NGT clamp trial  Insulin requirements past 24 hours: none  Current Nutrition: NPO  IVF: none  Central access: PICC line ordered 7/19, placed on 7/20 TPN start date: 7/20  ASSESSMENT                                                                                                           Today, 08/10/17  - Glucose:  (goal 100-150) At goal.  SSI/CBGs stopped 10/10. - Electrolytes:  K 3.9  - daily IV replacement  Mag 1.6  - daily IV replacement  Phos 4.7, increased but lytes already removed from TPN (were added back into TPN on 10/6-10/7)  Na 135-  sodium in TPN.  Corr Ca 10.2; Ca/phos product 48 (goal < 55) - Renal:  SCr wnl, stable  -NGT output - none documented.  Weight looks OK  - + BMs (last 10/12) - LFTs:  WNL except Alk Phos which is elevated, trending up - TGs:  Slightly elevated prior to starting TPN, 145 on 10/8 - Prealbumin: Low on admission but trended up and remained at or near goal during Aug to Sep; now decreased to < 5 (10/8).    NUTRITIONAL GOALS                                                                                             RD recs (10/8):  137-153 gms Protein (1.7 - 1.9 g/kg) 2030 - 2270 Kcal (25-28 kcal/kg)  Recalculation (10/3): Clinimix 5/15 3000 mL over 16h cycle with lipids 3x/week on MWF only would  provide: 150 g protein and avg 2335 kcal/day  Glucose infusion rate will be 6.5 mg/kg/min at max cyclic rate. (Maximum 7 mg/kg/min)  Electrolyte goals:  K >/= 4.0; Mg >/= 2.0 with ileus  PLAN                                                                                                                         Now:  KCl 10 mEq IV x 4  Mag 4g IV x3  At 1800 today:  Continue Clinimix 5/15 (electrolyte free) 3000 mL/day cycled over 16 hr  50 ml/hr x 1 hr (6p-7p), 207 ml/hr x 12 hr (7p-9a), 50 ml/hr x 1 hr (9a-10a), then off  Continue sodium 125 mEq/L in TPN: add as 70 mEq/L NaCl and 55 mEq/L Na Acetate   Lipids at 20 mls/hr x 12 hours 3 times weekly on MWF  Standard MVI, Trace elements, and pepcid 40 mg/day in TPN  Pepcid and IV protonix for coffee ground material from NGT in early August.  In October had some blood in NGT  TPN lab panels on Mondays & Thursdays.   BMET, Mag & Phos daily ordered.  Netta Cedars, PharmD, BCPS Pager: [phone] 08/10/2017 8:12 AM

## 2017-08-10 NOTE — Care Management Note (Signed)
Case Management Note  Patient Details  Name: [name] [name] MRN: [id number] Date of Birth: 11-Sep-1961  Subjective/Objective:                  Iv tpn, open abd wound, ng tube being clamped  Action/Plan: Date:  October 15,2 018 Chart reviewed for concurrent status and case management needs.  Will continue to follow patient progress.  Discharge Planning: following for needs  Expected discharge date: [id number]  [name], BSN, [place], [place]   Expected Discharge Date:                  Expected Discharge Plan:  Home/Self Care  In-House Referral:     Discharge planning Services  CM Consult  Post Acute Care Choice:    Choice offered to:     DME Arranged:    DME Agency:     HH Arranged:    HH Agency:     Status of Service:  In process, will continue to follow  If discussed at Long Length of Stay Meetings, dates discussed:    Additional Comments:  [name] Cha, RN 08/10/2017, 8:41 AM

## 2017-08-11 ENCOUNTER — Inpatient Hospital Stay (HOSPITAL_COMMUNITY): Payer: Medicare Other

## 2017-08-11 DIAGNOSIS — M79609 Pain in unspecified limb: Secondary | ICD-10-CM

## 2017-08-11 LAB — BASIC METABOLIC PANEL
ANION GAP: 11 (ref 5–15)
BUN: 14 mg/dL (ref 6–20)
CALCIUM: 9.2 mg/dL (ref 8.9–10.3)
CO2: 23 mmol/L (ref 22–32)
CREATININE: 0.6 mg/dL — AB (ref 0.61–1.24)
Chloride: 102 mmol/L (ref 101–111)
Glucose, Bld: 141 mg/dL — ABNORMAL HIGH (ref 65–99)
Potassium: 3 mmol/L — ABNORMAL LOW (ref 3.5–5.1)
SODIUM: 136 mmol/L (ref 135–145)

## 2017-08-11 LAB — PHOSPHORUS: Phosphorus: 5.5 mg/dL — ABNORMAL HIGH (ref 2.5–4.6)

## 2017-08-11 LAB — MAGNESIUM: MAGNESIUM: 1.8 mg/dL (ref 1.7–2.4)

## 2017-08-11 MED ORDER — TRACE MINERALS CR-CU-MN-SE-ZN 10-1000-500-60 MCG/ML IV SOLN
INTRAVENOUS | Status: AC
  Administered 2017-08-11: 18:00:00 via INTRAVENOUS
  Filled 2017-08-11: qty 3000
  Filled 2017-08-11: qty 1000

## 2017-08-11 MED ORDER — FENTANYL 40 MCG/ML IV SOLN
INTRAVENOUS | Status: DC
  Administered 2017-08-11: 75 ug via INTRAVENOUS
  Administered 2017-08-11: 440.6 ug via INTRAVENOUS
  Administered 2017-08-11 – 2017-08-12 (×2): via INTRAVENOUS
  Administered 2017-08-12: 422.7 ug via INTRAVENOUS
  Administered 2017-08-12: 354.2 ug via INTRAVENOUS
  Administered 2017-08-12: 18:00:00 via INTRAVENOUS
  Administered 2017-08-12: 320 ug via INTRAVENOUS
  Administered 2017-08-12: 300 ug via INTRAVENOUS
  Administered 2017-08-13: 150 ug via INTRAVENOUS
  Administered 2017-08-13: 40 mL via INTRAVENOUS
  Administered 2017-08-13: 19:00:00 via INTRAVENOUS
  Administered 2017-08-13: 300 ug via INTRAVENOUS
  Administered 2017-08-13: 525 ug via INTRAVENOUS
  Administered 2017-08-13: 300 ug via INTRAVENOUS
  Administered 2017-08-14: 473.1 ug via INTRAVENOUS
  Administered 2017-08-14: 300 ug via INTRAVENOUS
  Administered 2017-08-14: 395.3 ug via INTRAVENOUS
  Administered 2017-08-14: 75 ug via INTRAVENOUS
  Administered 2017-08-14: 375 ug via INTRAVENOUS
  Administered 2017-08-14: 225 ug via INTRAVENOUS
  Administered 2017-08-14: 1000 ug via INTRAVENOUS
  Administered 2017-08-14: 150 ug via INTRAVENOUS
  Administered 2017-08-14: 08:00:00 via INTRAVENOUS
  Administered 2017-08-15: 1000 ug via INTRAVENOUS
  Administered 2017-08-15: 561.5 ug via INTRAVENOUS
  Administered 2017-08-15: 573.2 ug via INTRAVENOUS
  Administered 2017-08-15: 1000 ug via INTRAVENOUS
  Administered 2017-08-15: 398.6 ug via INTRAVENOUS
  Administered 2017-08-15: 05:00:00 via INTRAVENOUS
  Administered 2017-08-16: 276.1 ug via INTRAVENOUS
  Administered 2017-08-16: 1000 ug via INTRAVENOUS
  Administered 2017-08-16: 485.1 ug via INTRAVENOUS
  Administered 2017-08-16: 1000 ug via INTRAVENOUS
  Filled 2017-08-11 (×14): qty 25

## 2017-08-11 MED ORDER — FLUCONAZOLE 100MG IVPB
100.0000 mg | INTRAVENOUS | Status: DC
  Administered 2017-08-11 – 2017-08-16 (×6): 100 mg via INTRAVENOUS
  Filled 2017-08-11 (×6): qty 50

## 2017-08-11 MED ORDER — POTASSIUM CHLORIDE 10 MEQ/50ML IV SOLN
10.0000 meq | INTRAVENOUS | Status: AC
  Administered 2017-08-11 (×6): 10 meq via INTRAVENOUS
  Filled 2017-08-11 (×6): qty 50

## 2017-08-11 MED ORDER — MAGNESIUM SULFATE 4 GM/100ML IV SOLN
4.0000 g | INTRAVENOUS | Status: AC
  Administered 2017-08-11 (×3): 4 g via INTRAVENOUS
  Filled 2017-08-11 (×3): qty 100

## 2017-08-11 MED ORDER — DIAZEPAM 5 MG/ML IJ SOLN
10.0000 mg | Freq: Two times a day (BID) | INTRAMUSCULAR | Status: DC
  Administered 2017-08-11 – 2017-08-14 (×5): 10 mg via INTRAVENOUS
  Administered 2017-08-16: 5 mg via INTRAVENOUS
  Filled 2017-08-11 (×7): qty 2

## 2017-08-11 NOTE — Progress Notes (Signed)
VASCULAR LAB PRELIMINARY  PRELIMINARY  PRELIMINARY  PRELIMINARY  Bilateral lower extremity venous duplex completed.    Preliminary report:  Bilateral:  No evidence of DVT, superficial thrombosis, or Baker's Cyst.   Damontae Loppnow, RVS 08/11/2017, 9:38 AM

## 2017-08-11 NOTE — Progress Notes (Addendum)
Palliative Care  Progress Note Reason: Symptom Management  [name] continues to be a challenging pain and symptom management patient- much of what he is experiencing is driven by anxiety and anticipatory anxiety/pain and prior tolerance from chronic opioid use for cervical radiculopathy. He is post op from revision and this is an expected set back with his pain management- and I expected that in the days even approaching his surgery he would need more medication from the anxiety related to fear/anticipation. He is genrally miserable and uncomfortable and seeking relief- I tried to provide reassurance and support. Getting the NG tube out will hopefully help. He is having bowel movements and has bowel sounds. I encouraged him taking the Zyprexa at night for nausea and anxiety. He has recurrence of oral thrush-think coating on his tongue buccal patches.  For now I will continue to titrate his fentanyl and ok to use limited amounts of dilaudid for drg changes etc. Previous dilaudid cause hyperalgesia. Until his GI tract is working opioid rotation will be challenging. Looking to his future care I will contact Dr. Maryjean Ka who can help with reducing his opioids and he may even benefit from inpatient rehab and PMR assistance with this during rehab phase.  Fentanyl to 75/hr continuous infusion +75 bolus Durgaesic remains on at 177mcg/hr Dilaudid PRNM breakthough  I increased diazepam  to 10 BID instead of 5 TID. Encouraged Zyprexa. Started fluconazole- the Nystatin makes him nauseated.   [name] Hacker, DO Palliative Medicine [phone]  35 min Greater than 50%  of this time was spent counseling and coordinating care related to the above assessment and plan.

## 2017-08-11 NOTE — Progress Notes (Addendum)
Patient has tolerated liquids, ice chips, and small amount of jello well. No nausea nor vomiting. Patient and spouse refused to d/c NG tube. The NG tube is turned off, but is still in the nare. Patient and spouse refused to get to chair today. Will continue to encourage movement to chair. PA with [facility] notified of events today. Will continue to monitor patient.

## 2017-08-11 NOTE — Progress Notes (Signed)
Patient and spouse is requesting for the NG tube to stay in until Attending Provider rounds. NG tube is cut off, will try ice chips. Will continue to monitor patient.

## 2017-08-11 NOTE — Progress Notes (Signed)
[place] NOTE   Pharmacy Consult for TPN Indication: prolonged ileus, small bowel leak, multiple bowel surgeries  Patient Measurements: Body mass index is 25.18 kg/m. Filed Weights   08/05/17 0416 08/06/17 1147 08/08/17 0430  Weight: 183 lb 13.8 oz (83.4 kg) 176 lb 9.4 oz (80.1 kg) 175 lb 7.8 oz (79.6 kg)   HPI: 41 yoM admitted on 7/11 for enlarging adrenal mass and planned adrenalectomy.  Pharmacy consulted to dose TPN.  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   7/22 OR:  wash out, tube ileostomy, left with open abdomen & wound vac in place.  Plan for OR on 7/25 for closure 7/25 OR: mesh placed for abdominal closure, wound vac 8/2 extubated, propofol drip off 8/4 TPN off for ~ 5 hours due to line detached from filter 8/10 MD request decrease rate x 1 week 2nd low sodium 8/12 Additional sodium added to TPN due to persistent hyponatremia 8/16 Increasing rate back to goal since sodium has improved with above intervention.   8/20 Prealbumin decreased.  New apparent enteric leak per surgery notes. 8/21 CaPhos product elevated, remove electrolytes from TPN 8/25 PICC did not have blood return in 2 of the 3 ports.  Alteplase intra-catheter and function returned.  TPN infusing correctly on 8/26 per RN. 8/28: d/c SSI 8/30: added electrolytes to TPN 8/31: TPN IV line came out around ~0340 and current bag was stopped at this time.  Restart new clinimix 5/15 1L bag (no lytes since phos is elevated this morning) until next bag is due at [facility]. Will not add MVI and trace elements to this 1L  bag. Use electrolyte free clinimix with new bag at [facility] 9/1 NGT out 9/3 added electrolytes to TPN 9/4 lytes out of TPN 9/7 cycle over 18 hrs 9/8 add back electrolytes to TPN.  Cycle over 14 hours 9/9 lytes out of TPN, continue 14 hr cycle, DC SSI/CBGs 9/13 electrolytes added back to TPN 9/14 remove electrolytes from TPN 10/3 Adjusted estimated needs based on prealbumin (decreased on 9/24, low but slightly improved on 10/1) and attempt to maximize nutrition prior to surgery planned for 07/31/17 - required increase of cycle to 16 hours. 10/5 to OR for fistula-repair surgery 10/6: Lytes added back to TPN with low Phos 10/10 D/C CBGs 10/14 NGT clamp trial 10/16: CLD ordered, plan to remove NGT  Insulin requirements past 24 hours: none  Current Nutrition: bariatric CLD ordered to start 10/16 (HIGH protein)  IVF: none  Central access: PICC line ordered 7/19, placed on 7/20 TPN start date: 7/20  ASSESSMENT  Today, 08/11/17  - Glucose:  (goal 100-150) At goal.  SSI/CBGs discontinued 10/10. - Electrolytes:  K 3.0  - daily IV replacement  Mag 1.8  - daily IV replacement  Phos 5.5, increased but lytes already removed from TPN   Na 136-  sodium in TPN.  Corr Ca 10.4; Ca/phos product 57.2 (goal < 55) - Renal:  SCr wnl, stable  -NGT output -+397m.  Weight looks OK  - + BMs (last 10/12) - LFTs:  WNL except Alk Phos which is elevated, trending up - TGs:  Slightly elevated prior to starting TPN, but now WNL- 148 on 10/8 - Prealbumin: Low on admission but trended up and remained at or near goal during Aug to Sep; now decreased but trending back to goal (15.6 on 10/15).    NUTRITIONAL GOALS                                                                                             RD recs (10/8):  137-153 gms Protein (1.7 - 1.9 g/kg) 2030 - 2270 Kcal (25-28 kcal/kg)  Recalculation  (10/3): Clinimix 5/15 3000 mL over 16h cycle with lipids 3x/week on MWF only would provide: 150 g protein and avg 2335 kcal/day  Glucose infusion rate will be 6.5 mg/kg/min at max cyclic rate. (Maximum 7 mg/kg/min)  Electrolyte goals:  K >/= 4.0; Mg >/= 2.0 with ileus  PLAN                                                                                                                         Now:  KCl 10 mEq IV x 8  Mag 4g IV x3  TPN rate not increased until 0130 this morning so RN to continue at 207 ml/hr until 2p today then decrease to 551mhr until entire 3L has infused.    At 1800 today:  Continue Clinimix 5/15 (electrolyte free) 3000 mL/day cycled over 16 hr  50 ml/hr x 1 hr (6p-7p), 207 ml/hr x 12 hr (7p-9a), 50 ml/hr x 1 hr (9a-10a), then off  Continue sodium 125 mEq/L in TPN: add as 70 mEq/L NaCl and 55 mEq/L Na Acetate   Lipids at 20 mls/hr x 12 hours 3 times weekly on MWF  Standard MVI, Trace elements, and pepcid 40 mg/day in TPN  Pepcid and IV protonix for coffee ground material from NGT in early August.  In October had some blood in NGT  TPN lab panels on Mondays & Thursdays.   BMET, Mag & Phos daily ordered.  F/U how patient tolerates diet & adjust TPN rate accordingly  [name] CedarsPharmD, BCPS  Pager: [phone] 08/11/2017 9:36 AM

## 2017-08-11 NOTE — Progress Notes (Signed)
11 Days Post-Op    CC: adrenal mass/post op bowel dehiscence/chronic/acute pain  Subjective: Discussed pulling NG, he and wife currently agreeable to this.  Wound is stable and looks about the same,    Objective: Vital signs in last 24 hours: Temp:  [97.9 F (36.6 C)-99.2 F (37.3 C)] 99.2 F (37.3 C) (10/16 0312) Pulse Rate:  [91-108] 91 (10/16 0000) Resp:  [15-19] 15 (10/16 0547) BP: (117-123)/(74-82) 117/82 (10/16 0000) SpO2:  [96 %-100 %] 98 % (10/16 0547) FiO2 (%):  [98 %] 98 % (10/15 2243) Last BM Date: 08/07/17 2167 IV NPO Urine 1855 NG 300 BM x 1 Afebrile, VSS K+ 3.0/Mag 1.8 Phos 5.5 - Pharmacy adjusting LE venous dopplers pending  Intake/Output from previous day: 10/15 0701 - 10/16 0700 In: 2167.6 [I.V.:1707.6; IV Piggyback:460] Out: 2155 [Urine:1855; Emesis/NG output:300] Intake/Output this shift: No intake/output data recorded.  General appearance: alert, cooperative and no distress Resp: clear to auscultation bilaterally GI: soft, few BS, wounds about the same slowly cleaning up.    Lab Results:   Recent Labs  08/10/17 0619  WBC 14.2*  HGB 9.1*  HCT 28.5*  PLT 897*    BMET  Recent Labs  08/10/17 0619 08/11/17 0331  NA 135 136  K 3.9 3.0*  CL 105 102  CO2 22 23  GLUCOSE 139* 141*  BUN 20 14  CREATININE 0.55* 0.60*  CALCIUM 9.0 9.2   PT/INR No results for input(s): LABPROT, INR in the last 72 hours.   Recent Labs Lab 08/06/17 0500 08/10/17 0619  AST 18 28  ALT 20 35  ALKPHOS 216* 279*  BILITOT 0.4 0.3  PROT 6.1* 6.7  ALBUMIN 2.2* 2.5*     Lipase     Component Value Date/Time   LIPASE 27 09/15/2015 0934     Medications: . Chlorhexidine Gluconate Cloth  6 each Topical Q0600  . diazepam  5 mg Intravenous Q8H  . enoxaparin (LOVENOX) injection  40 mg Subcutaneous Q24H  . fentaNYL  175 mcg Transdermal Q72H  . fentaNYL   Intravenous Q4H  . lidocaine  2 patch Transdermal Q24H  . metoCLOPramide (REGLAN) injection  10 mg  Intravenous Q8H  . nicotine  14 mg Transdermal Daily  . nystatin  5 mL Mouth/Throat QID  . OLANZapine zydis  5 mg Oral QHS  . pantoprazole (PROTONIX) IV  40 mg Intravenous BID   . methocarbamol (ROBAXIN)  IV Stopped (08/11/17 0530)  . ondansetron [facility]) IV 8 mg (08/10/17 2240)  . TPN (CLINIMIX) Adult without lytes 207 mL/hr at 08/11/17 0600     Assessment/Plan Adrenal mass/hx of prior hemicolectomy/ileocolic anastomosis 2353 1. S/p laparoscopic converted to open right adrenalectomy, small bowel resection with anastomosis, placement of 20cm^2 wound vac, 05/06/17, Dr. Lurena Joiner [name] 2. Post op wound dehiscence - primary repair of small bowel anastomotic leak, abdominal closure of wound dehisence, 05/13/17, Luke [name] 3. S/p reopening of recent laparotomy, lysis of adhesions, small bowel resection, placement 200cm^2 negative pressure dressing, 05/14/17, Luke [name] 4. S/p reopening of recent laparotomy, creation of tube ileostomy, placement of 100cm2 negative pressure dressing, 05/17/17, Luke [name] 5. S/p abdominal wash out, placement of mesh for abdominal closure, placement of wound vac of 100cm^2, 05/20/17, Luke [name] 6. S/p Bronchoscopy 05/22/17 Dr. Simonne Maffucci 7. Laparoscopy, laparotomy, extensive enterolysis with removal of mesh (partially) in the right upper quadrant; resection of old anastomosis and ileotransverse anastomosis, 07/31/17,Dr.Matthew Martin POD 7 On Bed rest day 7 EC fistula 06/13/17 - noted on exam Sepsis -  Acute respiratory failure/ post op HCAP pneumonia/post op pleural effusion - CCM/extubated 05/28/17 Malnutrition - PICC/TNA 05/14/17 Oral thrush  Hx of hypertension Hx of anemia Hx of anxiety Hx of hypothyroid Chronic pain medicine use - neck pain - Pt being followed by Palliative to assist with pain control. Tobacco use FEN: NPO/ice chips/cyclic TNA - started 92/9/24 - pharmacy adjusting electrolytes/Phos ID: Vancomycin 7/11- 05/25/17,  Maixpime 7/17-7/31/18, Eraxis 7/19-05/27/17, Diflucan 9/5-9/14, Cipro/ Flagyl pre-op 07/31/17 DVT: Lovenox Foley:  bed rest Follow up: Hassell Done    Plan:  Pull NG, sips of clears,with ice chips;  will order Bariatric clears to increase protein content.  Awaiting Venous dopplers and will decide on getting OOB after that.   Preliminary report is negative for DVT - OOB to chair today.     LOS: 97 days    Justinian Miano 08/11/2017 [phone]

## 2017-08-12 LAB — PHOSPHORUS: PHOSPHORUS: 4.4 mg/dL (ref 2.5–4.6)

## 2017-08-12 LAB — MAGNESIUM: Magnesium: 1.5 mg/dL — ABNORMAL LOW (ref 1.7–2.4)

## 2017-08-12 LAB — BASIC METABOLIC PANEL
Anion gap: 8 (ref 5–15)
BUN: 22 mg/dL — AB (ref 6–20)
CALCIUM: 9.2 mg/dL (ref 8.9–10.3)
CO2: 24 mmol/L (ref 22–32)
CREATININE: 0.57 mg/dL — AB (ref 0.61–1.24)
Chloride: 106 mmol/L (ref 101–111)
GFR calc Af Amer: >60 mL/min (ref >60)
Glucose, Bld: 122 mg/dL — ABNORMAL HIGH (ref 65–99)
Potassium: 3.5 mmol/L (ref 3.5–5.1)
SODIUM: 138 mmol/L (ref 135–145)

## 2017-08-12 MED ORDER — FAT EMULSION 20 % IV EMUL
240.0000 mL | INTRAVENOUS | Status: AC
  Administered 2017-08-12: 240 mL via INTRAVENOUS
  Filled 2017-08-12: qty 250

## 2017-08-12 MED ORDER — TRACE MINERALS CR-CU-MN-SE-ZN 10-1000-500-60 MCG/ML IV SOLN
INTRAVENOUS | Status: AC
  Administered 2017-08-12: 19:00:00 via INTRAVENOUS
  Filled 2017-08-12: qty 3000
  Filled 2017-08-12: qty 2000

## 2017-08-12 MED ORDER — MAGNESIUM SULFATE 4 GM/100ML IV SOLN
4.0000 g | INTRAVENOUS | Status: AC
  Administered 2017-08-12 (×3): 4 g via INTRAVENOUS
  Filled 2017-08-12 (×3): qty 100

## 2017-08-12 MED ORDER — POTASSIUM CHLORIDE 10 MEQ/50ML IV SOLN
10.0000 meq | INTRAVENOUS | Status: AC
  Administered 2017-08-12 (×6): 10 meq via INTRAVENOUS
  Filled 2017-08-12 (×6): qty 50

## 2017-08-12 NOTE — Progress Notes (Signed)
Patient ID: [name] [name], male   DOB: 14-Dec-1960, 56 y.o.   MRN: [id number] [facility] Surgery Progress Note:   12 Days Post-Op  Subjective: Mental status is clear.  Slept well and had a good night.  NG is still in place although not to suction Objective: Vital signs in last 24 hours: Temp:  [97.7 F (36.5 C)-98.3 F (36.8 C)] 97.8 F (36.6 C) (10/17 0342) Pulse Rate:  [91-108] 100 (10/17 0400) Resp:  [14-20] 16 (10/17 0559) BP: (109-136)/(73-88) 109/77 (10/17 0400) SpO2:  [98 %-100 %] 99 % (10/17 0559)  Intake/Output from previous day: 10/16 0701 - 10/17 0700 In: 2933.5 [I.V.:2223.5; IV Piggyback:710] Out: 6294 [Urine:1400; Emesis/NG output:250] Intake/Output this shift: No intake/output data recorded.  Physical Exam: Work of breathing is not labored.  Abdominal binder in place  Lab Results:  Results for orders placed or performed during the hospital encounter of 05/06/17 (from the past 48 hour(s))  Magnesium     Status: None   Collection Time: 08/11/17  3:31 AM  Result Value Ref Range   Magnesium 1.8 1.7 - 2.4 mg/dL  Phosphorus     Status: Abnormal   Collection Time: 08/11/17  3:31 AM  Result Value Ref Range   Phosphorus 5.5 (H) 2.5 - 4.6 mg/dL  Basic metabolic panel     Status: Abnormal   Collection Time: 08/11/17  3:31 AM  Result Value Ref Range   Sodium 136 135 - 145 mmol/L   Potassium 3.0 (L) 3.5 - 5.1 mmol/L    Comment: DELTA CHECK NOTED REPEATED TO VERIFY    Chloride 102 101 - 111 mmol/L   CO2 23 22 - 32 mmol/L   Glucose, Bld 141 (H) 65 - 99 mg/dL   BUN 14 6 - 20 mg/dL   Creatinine, Ser 0.60 (L) 0.61 - 1.24 mg/dL   Calcium 9.2 8.9 - 10.3 mg/dL   GFR calc non Af Amer >60 >60 mL/min   GFR calc Af Amer >60 >60 mL/min    Comment: (NOTE) The eGFR has been calculated using the CKD EPI equation. This calculation has not been validated in all clinical situations. eGFR's persistently <60 mL/min signify possible Chronic Kidney Disease.    Anion gap 11 5 -  15    Radiology/Results: No results found.  Anti-infectives: Anti-infectives    Start     Dose/Rate Route Frequency Ordered Stop   08/11/17 1400  fluconazole (DIFLUCAN) IVPB 100 mg     100 mg 50 mL/hr over 60 Minutes Intravenous Every 24 hours 08/11/17 1312     08/01/17 0600  metroNIDAZOLE (FLAGYL) IVPB 500 mg  Status:  Discontinued     500 mg 100 mL/hr over 60 Minutes Intravenous On call to O.R. 07/31/17 1656 07/31/17 1709   08/01/17 0600  ciprofloxacin (CIPRO) IVPB 400 mg  Status:  Discontinued     400 mg 200 mL/hr over 60 Minutes Intravenous On call to O.R. 07/31/17 1656 07/31/17 1709   07/31/17 0556  metroNIDAZOLE (FLAGYL) IVPB 500 mg     500 mg 100 mL/hr over 60 Minutes Intravenous On call to O.R. 07/31/17 0556 07/31/17 0840   07/31/17 0556  ciprofloxacin (CIPRO) IVPB 400 mg     400 mg 200 mL/hr over 60 Minutes Intravenous On call to O.R. 07/31/17 0556 07/31/17 0914   07/08/17 1600  fluconazole (DIFLUCAN) IVPB 100 mg     100 mg 50 mL/hr over 60 Minutes Intravenous Every 24 hours 07/08/17 0828 07/14/17 1710   07/01/17 1500  fluconazole (DIFLUCAN) IVPB 100 mg     100 mg 50 mL/hr over 60 Minutes Intravenous Every 24 hours 07/01/17 1401 07/07/17 1715   06/16/17 1200  piperacillin-tazobactam (ZOSYN) IVPB 3.375 g     3.375 g 12.5 mL/hr over 240 Minutes Intravenous Every 8 hours 06/16/17 1026 06/17/17 0004   06/08/17 1200  piperacillin-tazobactam (ZOSYN) IVPB 3.375 g  Status:  Discontinued     3.375 g 12.5 mL/hr over 240 Minutes Intravenous Every 8 hours 06/08/17 1056 06/16/17 1026   05/29/17 2200  ceFEPIme (MAXIPIME) 2 g in dextrose 5 % 50 mL IVPB  Status:  Discontinued     2 g 100 mL/hr over 30 Minutes Intravenous Every 8 hours 05/29/17 2031 06/03/17 0832   05/29/17 2100  metroNIDAZOLE (FLAGYL) IVPB 500 mg  Status:  Discontinued     500 mg 100 mL/hr over 60 Minutes Intravenous Every 8 hours 05/29/17 2029 06/03/17 0832   05/26/17 2200  ceFAZolin (ANCEF) IVPB 1 g/50 mL premix   Status:  Discontinued     1 g 100 mL/hr over 30 Minutes Intravenous Every 8 hours 05/26/17 1008 05/29/17 2023   05/25/17 1100  vancomycin (VANCOCIN) 1,250 mg in sodium chloride 0.9 % 250 mL IVPB  Status:  Discontinued     1,250 mg 166.7 mL/hr over 90 Minutes Intravenous Every 12 hours 05/25/17 0955 05/26/17 0946   05/22/17 1400  anidulafungin (ERAXIS) 100 mg in sodium chloride 0.9 % 100 mL IVPB     100 mg 78 mL/hr over 100 Minutes Intravenous Every 24 hours 05/21/17 1330 05/27/17 1740   05/21/17 1400  anidulafungin (ERAXIS) 200 mg in sodium chloride 0.9 % 200 mL IVPB     200 mg 78 mL/hr over 200 Minutes Intravenous  Once 05/21/17 1330 05/21/17 1940   05/15/17 1000  anidulafungin (ERAXIS) 100 mg in sodium chloride 0.9 % 100 mL IVPB  Status:  Discontinued     100 mg 78 mL/hr over 100 Minutes Intravenous Every 24 hours 05/14/17 0829 05/16/17 0901   05/15/17 1000  metroNIDAZOLE (FLAGYL) IVPB 500 mg  Status:  Discontinued     500 mg 100 mL/hr over 60 Minutes Intravenous Every 8 hours 05/15/17 0903 05/25/17 0938   05/14/17 0900  anidulafungin (ERAXIS) 200 mg in sodium chloride 0.9 % 200 mL IVPB     200 mg 78 mL/hr over 200 Minutes Intravenous  Once 05/14/17 0829 05/14/17 1325   05/13/17 1927  vancomycin (VANCOCIN) 1-5 GM/200ML-% IVPB    Comments:  Ward, Christa   : cabinet override      05/13/17 1927 05/14/17 0744   05/12/17 1400  ceFEPIme (MAXIPIME) 1 g in dextrose 5 % 50 mL IVPB     1 g 100 mL/hr over 30 Minutes Intravenous Every 8 hours 05/12/17 0939 05/26/17 2359   05/12/17 0900  vancomycin (VANCOCIN) IVPB 1000 mg/200 mL premix  Status:  Discontinued     1,000 mg 200 mL/hr over 60 Minutes Intravenous Every 12 hours 05/12/17 0750 05/16/17 0852   05/12/17 0830  aztreonam (AZACTAM) 2 GM IVPB     2 g 100 mL/hr over 30 Minutes Intravenous  Once 05/12/17 0800 05/12/17 0957   05/06/17 0916  vancomycin (VANCOCIN) IVPB 1000 mg/200 mL premix     1,000 mg 200 mL/hr over 60 Minutes Intravenous  On call to O.R. 05/06/17 0916 05/06/17 1229      Assessment/Plan: Problem List: Patient Active Problem List   Diagnosis Date Noted  . Hypomagnesemia 06/28/2017  . Hypothyroidism   .  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 Protein-calorie malnutrition, severe ([place]) 06/27/2017  . 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 Pressure injury of skin 05/29/2017  . Ileus ([place])   . Hypokalemia 05/14/2017  . Anastomotic leak of intestine 05/14/2017  . Wound dehiscence, surgical 05/14/2017  . Aspiration pneumonia ([place]) 05/14/2017  . Chronic narcotic use 05/10/2017  . Generalized anxiety disorder 05/10/2017  . Intra-abdominal adhesions s/p SB resection 05/06/2017 05/09/2017  . Throat symptom 03/18/2016  . Multinodular goiter 01/19/2016  . Hyperthyroidism 01/19/2016  . Essential hypertension   . Diarrhea 11/30/2014  . Anemia, iron deficiency 11/01/2014  . Leukocytosis 11/03/2013  . Tobacco abuse 07/26/2013  . COPD (chronic obstructive pulmonary disease) ([place]) 07/26/2013  . Left knee pain 07/26/2013  . Pain in joint, shoulder region 05/03/2013  . GERD 07/24/2008  . 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    Will get up in to chair today.  NG can come out when he wants to have it removed.   12 Days Post-Op    LOS: 98 days   Matt B. Hassell Done, MD, [facility] [phone] beeper [phone]  08/12/2017 7:43 AM

## 2017-08-12 NOTE — Progress Notes (Signed)
[facility] NOTE   Pharmacy Consult for TPN Indication: prolonged ileus, small bowel leak, multiple bowel surgeries  Patient Measurements: Body mass index is 25.18 kg/m. Filed Weights   08/05/17 0416 08/06/17 1147 08/08/17 0430  Weight: 183 lb 13.8 oz (83.4 kg) 176 lb 9.4 oz (80.1 kg) 175 lb 7.8 oz (79.6 kg)   HPI: 84 yoM admitted on 7/11 for enlarging adrenal mass and planned adrenalectomy.  Pharmacy consulted to dose TPN.  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   7/22 OR:  wash out, tube ileostomy, left with open abdomen & wound vac in place.  Plan for OR on 7/25 for closure 7/25 OR: mesh placed for abdominal closure, wound vac 8/2 extubated, propofol drip off 8/4 TPN off for ~ 5 hours due to line detached from filter 8/10 MD request decrease rate x 1 week 2nd low sodium 8/12 Additional sodium added to TPN due to persistent hyponatremia 8/16 Increasing rate back to goal since sodium has improved with above intervention.   8/20 Prealbumin decreased.  New apparent enteric leak per surgery notes. 8/21 CaPhos product elevated, remove electrolytes from TPN 8/25 PICC did not have blood return in 2 of the 3 ports.  Alteplase intra-catheter and function returned.  TPN infusing correctly on 8/26 per RN. 8/28: d/c SSI 8/30: added electrolytes to TPN 8/31: TPN IV line came out around ~0340 and current bag was stopped at this time.  Restart new clinimix 5/15 1L bag (no lytes since phos is elevated this morning) until next bag is due at [facility]. Will not add MVI and trace elements to this 1L  bag. Use electrolyte free clinimix with new bag at [facility] 9/1 NGT out 9/3 added electrolytes to TPN 9/4 lytes out of TPN 9/7 cycle over 18 hrs 9/8 add back electrolytes to TPN.  Cycle over 14 hours 9/9 lytes out of TPN, continue 14 hr cycle, DC SSI/CBGs 9/13 electrolytes added back to TPN 9/14 remove electrolytes from TPN 10/3 Adjusted estimated needs based on prealbumin (decreased on 9/24, low but slightly improved on 10/1) and attempt to maximize nutrition prior to surgery planned for 07/31/17 - required increase of cycle to 16 hours. 10/5 to OR for fistula-repair surgery 10/6: Lytes added back to TPN with low Phos 10/10 D/C CBGs 10/14 NGT clamp trial 10/16: CLD ordered, plan to remove NGT - patient does not want it removed yet but is not on suction.  Per RN notes patient tolerated liquids, ice chips, small amnt jello  Insulin requirements past 24 hours: none  Current Nutrition: bariatric CLD ordered to start 10/16 (HIGH protein)  IVF: none  Central access: PICC line ordered 7/19, placed on 7/20 TPN start date: 7/20  ASSESSMENT  Today, 08/12/17  - Glucose:  (goal 100-150) At goal.  SSI/CBGs discontinued 10/10. - Electrolytes:  K 3.5  - daily IV replacement  Mag 1.5  - daily IV replacement  Phos 4.4, increased but lytes already removed from TPN   Na WNL-  sodium in TPN.  Corr Ca 10.7; Ca/phos product 47 (goal < 55) - Renal:  SCr wnl, stable  -NGT output -212m.  Weight looks OK (last checked 10/13)  - + BMs  - LFTs:  WNL except Alk Phos which is elevated, trending up - TGs:  Slightly elevated prior to starting TPN, but now WNL- 148 on 10/8 - Prealbumin: Low on admission but trended up and remained at or near goal during Aug to Sep; now decreased but trending back to goal (15.6 on 10/15).    NUTRITIONAL GOALS                                                                                              RD recs (10/8):  137-153 gms Protein (1.7 - 1.9 g/kg) 2030 - 2270 Kcal (25-28 kcal/kg)  Recalculation (10/3): Clinimix 5/15 3000 mL over 16h cycle with lipids 3x/week on MWF only would provide: 150 g protein and avg 2335 kcal/day  Glucose infusion rate will be 6.5 mg/kg/min at max cyclic rate. (Maximum 7 mg/kg/min)  Electrolyte goals:  K >/= 4.0; Mg >/= 2.0 with ileus  PLAN                                                                                                                         Now:  KCl 10 mEq IV x 6  Mag 4g IV x3  At 1800 today:  Continue Clinimix 5/15 (electrolyte free) 3000 mL/day cycled over 16 hr  50 ml/hr x 1 hr (6p-7p), 207 ml/hr x 12 hr (7p-9a), 50 ml/hr x 1 hr (9a-10a), then off  Continue sodium 125 mEq/L in TPN: add as 70 mEq/L NaCl and 55 mEq/L Na Acetate   Lipids at 20 mls/hr x 12 hours 3 times weekly on MWF  Standard MVI, Trace elements, and pepcid 40 mg/day in TPN  Pepcid and IV protonix for coffee ground material from NGT in early August.  In October had some blood in NGT  TPN lab panels on Mondays & Thursdays.   BMET, Mag & Phos daily ordered.  F/U how patient tolerates diet & adjust TPN rate accordingly  DDoreene Eland PharmD, BCPS.   Pager: [phone]/17/2018 9:38 AM

## 2017-08-12 NOTE — Progress Notes (Signed)
   08/12/17 1620  Clinical Encounter Type  Visited With Patient and family together  Visit Type Follow-up  Spiritual Encounters  Spiritual Needs Emotional;Prayer   Continue to follow patient.  Today was big for the patient because he was able to get out of the bed for a while and the number of tubes has decreased.  Patient and spouse seems to be in good spirits and please with progress.  They greatly appreciate prayer.   Chaplain Katherene Ponto

## 2017-08-13 LAB — COMPREHENSIVE METABOLIC PANEL
ALK PHOS: 203 U/L — AB (ref 38–126)
ALT: 29 U/L (ref 17–63)
ANION GAP: 10 (ref 5–15)
AST: 20 U/L (ref 15–41)
Albumin: 2.5 g/dL — ABNORMAL LOW (ref 3.5–5.0)
BUN: 21 mg/dL — AB (ref 6–20)
CHLORIDE: 102 mmol/L (ref 101–111)
CO2: 23 mmol/L (ref 22–32)
Calcium: 9.1 mg/dL (ref 8.9–10.3)
Creatinine, Ser: 0.55 mg/dL — ABNORMAL LOW (ref 0.61–1.24)
GLUCOSE: 128 mg/dL — AB (ref 65–99)
Potassium: 3.6 mmol/L (ref 3.5–5.1)
SODIUM: 135 mmol/L (ref 135–145)
TOTAL PROTEIN: 6.8 g/dL (ref 6.5–8.1)
Total Bilirubin: 0.5 mg/dL (ref 0.3–1.2)

## 2017-08-13 LAB — PHOSPHORUS: PHOSPHORUS: 4.2 mg/dL (ref 2.5–4.6)

## 2017-08-13 LAB — MAGNESIUM: Magnesium: 1.4 mg/dL — ABNORMAL LOW (ref 1.7–2.4)

## 2017-08-13 MED ORDER — TRACE MINERALS CR-CU-MN-SE-ZN 10-1000-500-60 MCG/ML IV SOLN
INTRAVENOUS | Status: AC
  Administered 2017-08-13: 18:00:00 via INTRAVENOUS
  Filled 2017-08-13 (×2): qty 3000

## 2017-08-13 MED ORDER — POTASSIUM CHLORIDE 10 MEQ/50ML IV SOLN
10.0000 meq | INTRAVENOUS | Status: AC
  Administered 2017-08-13 (×4): 10 meq via INTRAVENOUS
  Filled 2017-08-13 (×4): qty 50

## 2017-08-13 MED ORDER — BOOST / RESOURCE BREEZE PO LIQD
1.0000 | ORAL | Status: DC
  Administered 2017-08-14 – 2017-08-19 (×4): 1 via ORAL

## 2017-08-13 MED ORDER — ENSURE ENLIVE PO LIQD
237.0000 mL | ORAL | Status: DC
  Administered 2017-08-13 – 2017-08-19 (×4): 237 mL via ORAL

## 2017-08-13 MED ORDER — MAGNESIUM SULFATE 4 GM/100ML IV SOLN
4.0000 g | INTRAVENOUS | Status: AC
  Administered 2017-08-13 (×3): 4 g via INTRAVENOUS
  Filled 2017-08-13 (×3): qty 100

## 2017-08-13 NOTE — Progress Notes (Signed)
Patient refuses to get out of bed until seen by physical therapy today.

## 2017-08-13 NOTE — Progress Notes (Signed)
Nutrition Follow-up  DOCUMENTATION CODES:   Not applicable  INTERVENTION:  - Continue cyclic TPN per Pharmacy. - Will order Ensure Enlive once/day, this supplement provides 350 kcal and 20 grams of protein. - Will order Boost Breeze once/day , this supplement provides 250 kcal and 9 grams of protein. - Will order Magic Cup BID with meals, each supplement provides 290 kcal and 9 grams of protein. - Continue to encourage PO intakes of meals and supplements.    NUTRITION DIAGNOSIS:   Inadequate oral intake related to poor appetite as evidenced by per patient/family report, meal completion < 25%. -revised, ongoing.   GOAL:   Patient will meet greater than or equal to 90% of their needs -met with TPN regimen and minimal PO intakes.   MONITOR:   PO intake, Supplement acceptance, Weight trends, Labs, Skin, I & O's, Other (Comment) (TPN regimen)  ASSESSMENT:   56 y.o. 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 On 7/18, he developed a leak therefore was taken back to OR for re-do of ex-lap and repair of leak.  7/11 FV:CBSWH adrenalectomy with complication of intestinal injury and small bowel resection with anastomosis  7/18 QP:RFFMBW of small bowel anastomotic leak, abdominal closure of wound dehisence 7/19 OR: reopening of recent laparotomy, lysis of adhesions, noted ischemic perforation of new loop of intestine, intact repair of previous anastomotic leak repair, intact previous anastomosis.  7/22 OR: wash out, tube ileostomy, left with open abdomen &wound vac in place. Plan for OR on 7/25 for closure 7/25 OR: mesh placed for abdominal closure, wound vac 8/2extubated, propofol drip off 8/4TPN off for ~ 5 hours due to line detached from filter. Ultrasound-guided aspiration By interventional radiology  of small perihepatic fluid collection yielded only 1 mL of the fluid, sent for culture. NPO - mild coffee ground on NG but no active bleed 8/6patient improving. Off Precedex drip since 8/4. Dilaudid Drip being changed over to PCA. Still with some coffee ground-looking material in NG tube.  8/10 hyponatremia and plan to decrease TPN from 3L/day to 2L/day x1 week 8/1fund to have fecal matter in wound vac (EC fistula) 8/14increase in fecal matter presence in wound vac with no plan for surgery at this time 8/16TPN advanced to goal rate of 115 ml/hr with 20% ILE @ 20 mL/hr x12 hours on MWF 8/22: NGT clamped. RD to order Boost Breeze for patient to sip on; Mycostatin started for oral thrush. 9/1:pulled NGT and no plans for replacement. 9/1:developed N/V and only permitted sips of clears, if tolerated. 9/4:NGT re-insertion attempted several times without success 9/7:begin cycling TPN 9/23: allowed to leave the floor to go outside 10/5:Laparoscopy, laparotomy, extensive enterolysis with removal of mesh (partially) in the right upper quadrant; resection of old anastomosis and ileotransverse anastomosis 10/5: NGT placement during surgery 10/15: development of recurrent oral thrush 10/17: NGT removed at 10:45 AM   10/18 Pt is POD #13. Diet advanced from NPO to CLD on 10/16 at 8:15 AM and then to Soft today at 8:05 AM. Pt reports eating a few bites each of grits and scrambled egg and denies associated abdominal pain or nausea. He reports some abdominal pressure with he attributes to his need to go to the bathroom. Plan to continue cyclic TPN regimen as outlined in 10/8 note. Weight has fluctuated over the past 1 week but primarily  consistent with weight from prior to surgery on 10/5.  Medications reviewed; 4 g IV Mg sulfate x3 runs today, 10 mg IV Reglan TID, 40 mg IV Protonix BID, 10 mEq IV KCl x4 runs today. Labs reviewed; creatinine: 0.55 mg/dL, Mg: 1.4 mg/dL, Alk Phos  elevated.    10/11 - Pt remains NPO since prior to surgery.  - NGT remains in place with very minimal to no output over the past few days.  - Current weight now consistent with weight, which was stable, for several weeks prior to surgery on 10/5.  - Triple lumen in place and cyclic TPN (as outlined on 10/8) continues.  - No plans at this time for NGT removal or for diet advancement.    10/8 - NGT in place with 400cc light brown output with ~150cc of this being from this shift.  - Pt very drowsy and mumbles words; no family/visitors present.  - Weight stable 10/2-10/4 and then +10 lbs/4.4 kg from 10/4-10/6 (likely from surgery).  - Cyclic TPN regimen was changed to be over 16 hours on 10/3.  - Spoke with Pharmacist this AM and she reports plan to continue current cyclic regimen but remove lytes so regimen will be: Clinimix 5/15 over 16 hours for 3L with 20% ILE @ 20 mL/hr x12 hours on MWF.  - This regimen provides a daily average of 150 grams of protein 2336 kcal.  - Plans to empirically order K and Mg runs as these tend to drop low for this pt when lytes are removed from his TPN.   Phos: 5 mg/dL.    Diet Order:  DIET SOFT Room service appropriate? Yes; Fluid consistency: Thin TPN (CLINIMIX) Adult without lytes  Skin:  Abdominal incision 10/5  Last BM:  10/17  Height:   Ht Readings from Last 1 Encounters:  05/26/17 _0  (1.778 m)    Weight:   Wt Readings from Last 1 Encounters:  08/08/17 175 lb 7.8 oz (79.6 kg)    Ideal Body Weight:  75.45 kg  BMI:  Body mass index is 25.18 kg/m.  Estimated Nutritional Needs:   Kcal:  2030-2270 (25-28 kcal/kg)  Protein:  137-153 grams (1.7-1.9 grams/kg)  Fluid:  2 L/day  EDUCATION NEEDS:   No education needs identified at this time    Jarome Matin, MS, RD, LDN, CNSC Inpatient Clinical Dietitian Pager # [phone] After hours/weekend pager # [phone]

## 2017-08-13 NOTE — Care Management Note (Signed)
Case Management Note  Patient Details  Name: [name] [name] MRN: [id number] Date of Birth: 04-01-1961  Subjective/Objective:                  Patient continues to progress post op day 13/up in chair and advancing to a soft diet still has iv pain meds.  Action/Plan: Date:  August 13, 2017 Chart reviewed for concurrent status and case management needs.  Will continue to follow patient progress.  Discharge Planning: following for needs  Expected discharge date: August 16, 2017  [name], BSN, [place], [place]   Expected Discharge Date:                  Expected Discharge Plan:  Home/Self Care  In-House Referral:     Discharge planning Services  CM Consult  Post Acute Care Choice:    Choice offered to:     DME Arranged:    DME Agency:     HH Arranged:    HH Agency:     Status of Service:  In process, will continue to follow  If discussed at Long Length of Stay Meetings, dates discussed:    Additional Comments:  Leeroy Cha, RN 08/13/2017, 9:23 AM

## 2017-08-13 NOTE — Progress Notes (Signed)
[place] NOTE   Pharmacy Consult for TPN Indication: prolonged ileus, small bowel leak, multiple bowel surgeries  Patient Measurements: Body mass index is 25.18 kg/m. Filed Weights   08/05/17 0416 08/06/17 1147 08/08/17 0430  Weight: 183 lb 13.8 oz (83.4 kg) 176 lb 9.4 oz (80.1 kg) 175 lb 7.8 oz (79.6 kg)   HPI: [name] [name] admitted on 7/11 for enlarging adrenal mass and planned adrenalectomy.  Pharmacy consulted to dose TPN.  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   7/22 OR:  wash out, tube ileostomy, left with open abdomen & wound vac in place.  Plan for OR on 7/25 for closure 7/25 OR: mesh placed for abdominal closure, wound vac 8/2 extubated, propofol drip off 8/4 TPN off for ~ 5 hours due to line detached from filter 8/10 MD request decrease rate x 1 week 2nd low sodium 8/12 Additional sodium added to TPN due to persistent hyponatremia 8/16 Increasing rate back to goal since sodium has improved with above intervention.   8/20 Prealbumin decreased.  New apparent enteric leak per surgery notes. 8/21 CaPhos product elevated, remove electrolytes from TPN 8/25 PICC did not have blood return in 2 of the 3 ports.  Alteplase intra-catheter and function returned.  TPN infusing correctly on 8/26 per RN. 8/28: d/c SSI 8/30: added electrolytes to TPN 8/31: TPN IV line came out around ~0340 and current bag was stopped at this time.  Restart new clinimix 5/15 1L bag (no lytes since phos is elevated this morning) until next bag is due at [facility]. Will not add MVI and trace elements to this 1L  bag. Use electrolyte free clinimix with new bag at [facility] 9/1 NGT out 9/3 added electrolytes to TPN 9/4 lytes out of TPN 9/7 cycle over 18 hrs 9/8 add back electrolytes to TPN.  Cycle over 14 hours 9/9 lytes out of TPN, continue 14 hr cycle, DC SSI/CBGs 9/13 electrolytes added back to TPN 9/14 remove electrolytes from TPN 10/3 Adjusted estimated needs based on prealbumin (decreased on 9/24, low but slightly improved on 10/1) and attempt to maximize nutrition prior to surgery planned for 07/31/17 - required increase of cycle to 16 hours. 10/5 to OR for fistula-repair surgery 10/6: Lytes added back to TPN with low Phos 10/10 D/C CBGs 10/14 NGT clamp trial 10/16: CLD ordered, plan to remove NGT - patient does not want it removed yet but is not on suction.  Per RN notes patient tolerated liquids, ice chips, small amnt jello 10/17: NGT removed  Insulin requirements past 24 hours: none  Current Nutrition: bariatric CLD ordered to start 10/16 (HIGH protein)  IVF: none  Central access: PICC line ordered 7/19, placed on 7/20 TPN start date: 7/20  ASSESSMENT  Today, 08/13/17  - Glucose:  (goal 100-150) At goal.  SSI/CBGs discontinued 10/10. - Electrolytes:  K 3.6  - daily IV replacement  Mag 1.4  - daily IV replacement  Phos 4.2 - has been variable, lytes removed from TPN to avoid high Phos levels  Na WNL- have been providing ~42 mEq/L Na in TPN  Corr Ca 10.3; Ca/phos product 43 (goal < 55) - Renal:  SCr wnl, stable  - NGT removed. Weight looks OK (last checked 10/13)  - + BMs  - LFTs:  WNL except Alk Phos which is elevated - TGs:  Slightly elevated prior to starting TPN, but now WNL- 148 on 10/15 - Prealbumin: Low on admission but trended up and remained at or near goal during Aug to Sep; now decreased but trending back to goal (15.6 on 10/15).    NUTRITIONAL GOALS                                                                                              RD recs (10/8):  137-153 gms Protein (1.7 - 1.9 g/kg) 2030 - 2270 Kcal (25-28 kcal/kg)  Recalculation (10/3): Clinimix 5/15 3000 mL over 16h cycle with lipids 3x/week on MWF only would provide: 150 g protein and avg 2335 kcal/day  Glucose infusion rate will be 6.5 mg/kg/min at max cyclic rate. (Maximum 7 mg/kg/min)  PLAN                                                                                                                         Now:  KCl 10 mEq IV x 4  Mag 4g IV x3  At 1800 today:  Continue Clinimix 5/15 (electrolyte free) 3000 mL/day cycled over 16 hr  50 ml/hr x 1 hr (6p-7p), 207 ml/hr x 12 hr (7p-9a), 50 ml/hr x 1 hr (9a-10a), then off  Continue sodium in TPN as patient has been receiving since Na stable: add 70 mEq NaCl and 55 mEq Na Acetate in the 3L TPN bag  Lipids at 20 mls/hr x 12 hours 3 times weekly on MWF  Standard MVI, Trace elements, and pepcid 40 mg/day in TPN  Pepcid and IV protonix for coffee ground material from NGT in early August.  In October had some blood in NGT  TPN lab panels on Mondays & Thursdays.   BMET, Mag & Phos daily ordered.  F/U how patient tolerates diet & adjust TPN rate accordingly  Hershal Coria, PharmD, BCPS Pager: [phone] 08/13/2017 9:24 AM

## 2017-08-13 NOTE — Progress Notes (Signed)
08/13/17  1710  Patient was asked to ambulate. Patient sister was present in the room when patient verbalized that he did not want to get up to the chair nor ambulate. Patient stated that he will ambulate tomorrow 08/14/17. Also patient's family was told that on a soft diet patient is allowed baked/grilled fish. Pt family brought [employer] baked chicken wings. Pt was advised not to eat the chicken wings, but patient choose to eat them anyway.

## 2017-08-13 NOTE — Progress Notes (Signed)
Patient ID: [name] [name], male   DOB: 01/25/1961, 56 y.o.   MRN: [id number] [facility] Surgery Progress Note:   13 Days Post-Op  Subjective: Mental status is clear and feeling better. Got up into chair yesterday.   Objective: Vital signs in last 24 hours: Temp:  [98.3 F (36.8 C)-99.2 F (37.3 C)] 99.2 F (37.3 C) (10/18 0708) Pulse Rate:  [95-108] 102 (10/18 0800) Resp:  [13-22] 22 (10/18 0808) BP: (106-130)/(70-83) 116/83 (10/18 0800) SpO2:  [97 %-100 %] 98 % (10/18 0808)  Intake/Output from previous day: 10/17 0701 - 10/18 0700 In: 3351.6 [I.V.:2901.6; IV Piggyback:450] Out: 750 [Urine:650; Emesis/NG output:100] Intake/Output this shift: Total I/O In: 207 [I.V.:207] Out: 225 [Urine:225]  Physical Exam: Work of breathing is normal.  Abdominal binder in place.   Lab Results:  Results for orders placed or performed during the hospital encounter of 05/06/17 (from the past 48 hour(s))  Magnesium     Status: Abnormal   Collection Time: 08/12/17  8:03 AM  Result Value Ref Range   Magnesium 1.5 (L) 1.7 - 2.4 mg/dL  Phosphorus     Status: None   Collection Time: 08/12/17  8:03 AM  Result Value Ref Range   Phosphorus 4.4 2.5 - 4.6 mg/dL  Basic metabolic panel     Status: Abnormal   Collection Time: 08/12/17  8:03 AM  Result Value Ref Range   Sodium 138 135 - 145 mmol/L   Potassium 3.5 3.5 - 5.1 mmol/L   Chloride 106 101 - 111 mmol/L   CO2 24 22 - 32 mmol/L   Glucose, Bld 122 (H) 65 - 99 mg/dL   BUN 22 (H) 6 - 20 mg/dL   Creatinine, Ser 0.57 (L) 0.61 - 1.24 mg/dL   Calcium 9.2 8.9 - 10.3 mg/dL   GFR calc non Af Amer >60 >60 mL/min   GFR calc Af Amer >60 >60 mL/min    Comment: (NOTE) The eGFR has been calculated using the CKD EPI equation. This calculation has not been validated in all clinical situations. eGFR's persistently <60 mL/min signify possible Chronic Kidney Disease.    Anion gap 8 5 - 15  Magnesium     Status: Abnormal   Collection Time: 08/13/17  7:51  AM  Result Value Ref Range   Magnesium 1.4 (L) 1.7 - 2.4 mg/dL  Phosphorus     Status: None   Collection Time: 08/13/17  7:51 AM  Result Value Ref Range   Phosphorus 4.2 2.5 - 4.6 mg/dL  Comprehensive metabolic panel     Status: Abnormal   Collection Time: 08/13/17  7:51 AM  Result Value Ref Range   Sodium 135 135 - 145 mmol/L   Potassium 3.6 3.5 - 5.1 mmol/L   Chloride 102 101 - 111 mmol/L   CO2 23 22 - 32 mmol/L   Glucose, Bld 128 (H) 65 - 99 mg/dL   BUN 21 (H) 6 - 20 mg/dL   Creatinine, Ser 0.55 (L) 0.61 - 1.24 mg/dL   Calcium 9.1 8.9 - 10.3 mg/dL   Total Protein 6.8 6.5 - 8.1 g/dL   Albumin 2.5 (L) 3.5 - 5.0 g/dL   AST 20 15 - 41 U/L   ALT 29 17 - 63 U/L   Alkaline Phosphatase 203 (H) 38 - 126 U/L   Total Bilirubin 0.5 0.3 - 1.2 mg/dL   GFR calc non Af Amer >60 >60 mL/min   GFR calc Af Amer >60 >60 mL/min    Comment: (NOTE) The  eGFR has been calculated using the CKD EPI equation. This calculation has not been validated in all clinical situations. eGFR's persistently <60 mL/min signify possible Chronic Kidney Disease.    Anion gap 10 5 - 15    Radiology/Results: No results found.  Anti-infectives: Anti-infectives    Start     Dose/Rate Route Frequency Ordered Stop   08/11/17 1400  fluconazole (DIFLUCAN) IVPB 100 mg     100 mg 50 mL/hr over 60 Minutes Intravenous Every 24 hours 08/11/17 1312     08/01/17 0600  metroNIDAZOLE (FLAGYL) IVPB 500 mg  Status:  Discontinued     500 mg 100 mL/hr over 60 Minutes Intravenous On call to O.R. 07/31/17 1656 07/31/17 1709   08/01/17 0600  ciprofloxacin (CIPRO) IVPB 400 mg  Status:  Discontinued     400 mg 200 mL/hr over 60 Minutes Intravenous On call to O.R. 07/31/17 1656 07/31/17 1709   07/31/17 0556  metroNIDAZOLE (FLAGYL) IVPB 500 mg     500 mg 100 mL/hr over 60 Minutes Intravenous On call to O.R. 07/31/17 0556 07/31/17 0840   07/31/17 0556  ciprofloxacin (CIPRO) IVPB 400 mg     400 mg 200 mL/hr over 60 Minutes  Intravenous On call to O.R. 07/31/17 0556 07/31/17 0914   07/08/17 1600  fluconazole (DIFLUCAN) IVPB 100 mg     100 mg 50 mL/hr over 60 Minutes Intravenous Every 24 hours 07/08/17 0828 07/14/17 1710   07/01/17 1500  fluconazole (DIFLUCAN) IVPB 100 mg     100 mg 50 mL/hr over 60 Minutes Intravenous Every 24 hours 07/01/17 1401 07/07/17 1715   06/16/17 1200  piperacillin-tazobactam (ZOSYN) IVPB 3.375 g     3.375 g 12.5 mL/hr over 240 Minutes Intravenous Every 8 hours 06/16/17 1026 06/17/17 0004   06/08/17 1200  piperacillin-tazobactam (ZOSYN) IVPB 3.375 g  Status:  Discontinued     3.375 g 12.5 mL/hr over 240 Minutes Intravenous Every 8 hours 06/08/17 1056 06/16/17 1026   05/29/17 2200  ceFEPIme (MAXIPIME) 2 g in dextrose 5 % 50 mL IVPB  Status:  Discontinued     2 g 100 mL/hr over 30 Minutes Intravenous Every 8 hours 05/29/17 2031 06/03/17 0832   05/29/17 2100  metroNIDAZOLE (FLAGYL) IVPB 500 mg  Status:  Discontinued     500 mg 100 mL/hr over 60 Minutes Intravenous Every 8 hours 05/29/17 2029 06/03/17 0832   05/26/17 2200  ceFAZolin (ANCEF) IVPB 1 g/50 mL premix  Status:  Discontinued     1 g 100 mL/hr over 30 Minutes Intravenous Every 8 hours 05/26/17 1008 05/29/17 2023   05/25/17 1100  vancomycin (VANCOCIN) 1,250 mg in sodium chloride 0.9 % 250 mL IVPB  Status:  Discontinued     1,250 mg 166.7 mL/hr over 90 Minutes Intravenous Every 12 hours 05/25/17 0955 05/26/17 0946   05/22/17 1400  anidulafungin (ERAXIS) 100 mg in sodium chloride 0.9 % 100 mL IVPB     100 mg 78 mL/hr over 100 Minutes Intravenous Every 24 hours 05/21/17 1330 05/27/17 1740   05/21/17 1400  anidulafungin (ERAXIS) 200 mg in sodium chloride 0.9 % 200 mL IVPB     200 mg 78 mL/hr over 200 Minutes Intravenous  Once 05/21/17 1330 05/21/17 1940   05/15/17 1000  anidulafungin (ERAXIS) 100 mg in sodium chloride 0.9 % 100 mL IVPB  Status:  Discontinued     100 mg 78 mL/hr over 100 Minutes Intravenous Every 24 hours 05/14/17  0829 05/16/17 0901   05/15/17  1000  metroNIDAZOLE (FLAGYL) IVPB 500 mg  Status:  Discontinued     500 mg 100 mL/hr over 60 Minutes Intravenous Every 8 hours 05/15/17 0903 05/25/17 0938   05/14/17 0900  anidulafungin (ERAXIS) 200 mg in sodium chloride 0.9 % 200 mL IVPB     200 mg 78 mL/hr over 200 Minutes Intravenous  Once 05/14/17 0829 05/14/17 1325   05/13/17 1927  vancomycin (VANCOCIN) 1-5 GM/200ML-% IVPB    Comments:  Ward, Christa   : cabinet override      05/13/17 1927 05/14/17 0744   05/12/17 1400  ceFEPIme (MAXIPIME) 1 g in dextrose 5 % 50 mL IVPB     1 g 100 mL/hr over 30 Minutes Intravenous Every 8 hours 05/12/17 0939 05/26/17 2359   05/12/17 0900  vancomycin (VANCOCIN) IVPB 1000 mg/200 mL premix  Status:  Discontinued     1,000 mg 200 mL/hr over 60 Minutes Intravenous Every 12 hours 05/12/17 0750 05/16/17 0852   05/12/17 0830  aztreonam (AZACTAM) 2 GM IVPB     2 g 100 mL/hr over 30 Minutes Intravenous  Once 05/12/17 0800 05/12/17 0957   05/06/17 0916  vancomycin (VANCOCIN) IVPB 1000 mg/200 mL premix     1,000 mg 200 mL/hr over 60 Minutes Intravenous On call to O.R. 05/06/17 0916 05/06/17 1229      Assessment/Plan: Problem List: Patient Active Problem List   Diagnosis Date Noted  . Hypomagnesemia 06/28/2017  . Hypothyroidism   . 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 Protein-calorie malnutrition, severe ([place]) 06/27/2017  . 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 Pressure injury of skin 05/29/2017  . Ileus ([place])   . Hypokalemia 05/14/2017  . Anastomotic leak of intestine 05/14/2017  . Wound dehiscence, surgical 05/14/2017  . Aspiration pneumonia ([place]) 05/14/2017  . Chronic narcotic use 05/10/2017  . Generalized anxiety disorder 05/10/2017  . Intra-abdominal adhesions s/p SB resection 05/06/2017 05/09/2017  . Throat symptom 03/18/2016  . Multinodular goiter 01/19/2016  . Hyperthyroidism 01/19/2016  . Essential  hypertension   . Diarrhea 11/30/2014  . Anemia, iron deficiency 11/01/2014  . Leukocytosis 11/03/2013  . Tobacco abuse 07/26/2013  . COPD (chronic obstructive pulmonary disease) ([place]) 07/26/2013  . Left knee pain 07/26/2013  . Pain in joint, shoulder region 05/03/2013  . GERD 07/24/2008  . 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    Will advance to soft diet.   13 Days Post-Op    LOS: 99 days   Matt B. Hassell Done, MD, [facility] [phone] beeper [phone]  08/13/2017 8:54 AM

## 2017-08-14 DIAGNOSIS — F411 Generalized anxiety disorder: Secondary | ICD-10-CM

## 2017-08-14 DIAGNOSIS — F419 Anxiety disorder, unspecified: Secondary | ICD-10-CM

## 2017-08-14 LAB — BASIC METABOLIC PANEL
Anion gap: 8 (ref 5–15)
BUN: 19 mg/dL (ref 6–20)
CALCIUM: 9 mg/dL (ref 8.9–10.3)
CO2: 23 mmol/L (ref 22–32)
CREATININE: 0.54 mg/dL — AB (ref 0.61–1.24)
Chloride: 104 mmol/L (ref 101–111)
GFR calc non Af Amer: >60 mL/min (ref >60)
Glucose, Bld: 126 mg/dL — ABNORMAL HIGH (ref 65–99)
Potassium: 3.3 mmol/L — ABNORMAL LOW (ref 3.5–5.1)
SODIUM: 135 mmol/L (ref 135–145)

## 2017-08-14 LAB — PHOSPHORUS: PHOSPHORUS: 5 mg/dL — AB (ref 2.5–4.6)

## 2017-08-14 LAB — MAGNESIUM: MAGNESIUM: 1.7 mg/dL (ref 1.7–2.4)

## 2017-08-14 MED ORDER — ALTEPLASE 2 MG IJ SOLR
2.0000 mg | Freq: Once | INTRAMUSCULAR | Status: AC
  Administered 2017-08-14: 2 mg
  Filled 2017-08-14: qty 2

## 2017-08-14 MED ORDER — MAGNESIUM SULFATE 4 GM/100ML IV SOLN
4.0000 g | INTRAVENOUS | Status: AC
  Administered 2017-08-14 (×3): 4 g via INTRAVENOUS
  Filled 2017-08-14 (×3): qty 100

## 2017-08-14 MED ORDER — POTASSIUM CHLORIDE 10 MEQ/50ML IV SOLN
10.0000 meq | INTRAVENOUS | Status: AC
  Administered 2017-08-14 (×6): 10 meq via INTRAVENOUS
  Filled 2017-08-14 (×6): qty 50

## 2017-08-14 MED ORDER — TRACE MINERALS CR-CU-MN-SE-ZN 10-1000-500-60 MCG/ML IV SOLN
INTRAVENOUS | Status: AC
  Administered 2017-08-14: 18:00:00 via INTRAVENOUS
  Filled 2017-08-14: qty 3000
  Filled 2017-08-14: qty 1000

## 2017-08-14 MED ORDER — FAT EMULSION 20 % IV EMUL
240.0000 mL | INTRAVENOUS | Status: AC
  Administered 2017-08-14: 240 mL via INTRAVENOUS
  Filled 2017-08-14: qty 250

## 2017-08-14 MED ORDER — KETOROLAC TROMETHAMINE 15 MG/ML IJ SOLN
15.0000 mg | Freq: Once | INTRAMUSCULAR | Status: AC
  Administered 2017-08-14: 15 mg via INTRAVENOUS
  Filled 2017-08-14: qty 1

## 2017-08-14 NOTE — Progress Notes (Signed)
PT Cancellation Note  Patient Details Name: [name] [name] MRN: [id number] DOB: 02/18/1961   Cancelled Treatment:    Reason Eval/Treat Not Completed: Patient declined, no reason specified; Spoke with RN earlier this am to make an appointment to see pt, when PT arrived pt had just gotten out of bed with nursing staff and then proceeded to refuse therapy; Really recommend pt amb with nursing staff given that prior to last procedure pt was amb well over 1000' with PT; will re-attempt PT eval on Monday or as schedule permits;    [facility] 08/14/2017, 12:11 PM

## 2017-08-14 NOTE — Progress Notes (Signed)
[employer] NOTE   Pharmacy Consult for TPN Indication: prolonged ileus, small bowel leak, multiple bowel surgeries  Patient Measurements: Body mass index is 25.31 kg/m. Filed Weights   08/08/17 0430 08/13/17 1343 08/14/17 0500  Weight: 175 lb 7.8 oz (79.6 kg) 171 lb 1.2 oz (77.6 kg) 176 lb 5.9 oz (80 kg)   HPI: 17 yoM admitted on 7/11 for enlarging adrenal mass and planned adrenalectomy.  Pharmacy consulted to dose TPN.  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   7/22 OR:  wash out, tube ileostomy, left with open abdomen & wound vac in place.  Plan for OR on 7/25 for closure 7/25 OR: mesh placed for abdominal closure, wound vac 8/2 extubated, propofol drip off 8/4 TPN off for ~ 5 hours due to line detached from filter 8/10 MD request decrease rate x 1 week 2nd low sodium 8/12 Additional sodium added to TPN due to persistent hyponatremia 8/16 Increasing rate back to goal since sodium has improved with above intervention.   8/20 Prealbumin decreased.  New apparent enteric leak per surgery notes. 8/21 CaPhos product elevated, remove electrolytes from TPN 8/25 PICC did not have blood return in 2 of the 3 ports.  Alteplase intra-catheter and function returned.  TPN infusing correctly on 8/26 per RN. 8/28: d/c SSI 8/30: added electrolytes to TPN 8/31: TPN IV line came out around ~0340 and current bag was stopped at this time.  Restart new clinimix 5/15 1L bag (no lytes since phos is elevated this morning) until next bag is due at [facility]. Will not add MVI and trace elements to this 1L bag.  Use electrolyte free clinimix with new bag at [facility] 9/1 NGT out 9/3 added electrolytes to TPN 9/4 lytes out of TPN 9/7 cycle over 18 hrs 9/8 add back electrolytes to TPN.  Cycle over 14 hours 9/9 lytes out of TPN, continue 14 hr cycle, DC SSI/CBGs 9/13 electrolytes added back to TPN 9/14 remove electrolytes from TPN 10/3 Adjusted estimated needs based on prealbumin (decreased on 9/24, low but slightly improved on 10/1) and attempt to maximize nutrition prior to surgery planned for 07/31/17 - required increase of cycle to 16 hours. 10/5 to OR for fistula-repair surgery 10/6: Lytes added back to TPN with low Phos 10/10 D/C CBGs 10/14 NGT clamp trial 10/16: CLD ordered, plan to remove NGT - patient does not want it removed yet but is not on suction.  Per RN notes patient tolerated liquids, ice chips, small amnt jello 10/17: NGT removed  Insulin requirements past 24 hours: none  Current Nutrition: soft diet ordered to start 10/18  IVF: none  Central access: PICC line ordered 7/19, placed on 7/20 TPN start date: 7/20  ASSESSMENT  Today, 08/14/17  - Glucose:  (goal 100-150) At goal.  SSI/CBGs discontinued 10/10. - Electrolytes:  K 3.3  - daily IV replacement  Mag 1.7  - daily IV replacement  Phos 5.0 - has been variable, lytes removed from TPN to avoid high Phos levels  Na WNL- have been providing ~42 mEq/L Na in TPN  Corr Ca 10.2; Ca/phos product 51 (goal < 55) - Renal:  SCr wnl, stable  - NGT removed. Weight looks OK. Using some PRN Zofran, no emesis documented. Has IV Reglan scheduled TID.  - + BMs  - LFTs:  WNL except Alk Phos which is elevated - TGs:  Slightly elevated prior to starting TPN, but now WNL- 148 on 10/15 - Prealbumin: Low on admission but trended up and remained at or near goal during Aug to Sep; now decreased but trending back to goal (15.6 on 10/15).   -  Dietitian has ordered daily Boost, Ensure and recommended ordering Magic Cup BID with meals. Has received 1 Ensure so far.  RN documents that patient ate chicken wings last night brought in by family.  NUTRITIONAL GOALS                                                                                             RD recs (10/8):  137-153 gms Protein (1.7 - 1.9 g/kg) 2030 - 2270 Kcal (25-28 kcal/kg)  Recalculation (10/3): Clinimix 5/15 3000 mL over 16h cycle with lipids 3x/week on MWF only would provide: 150 g protein and avg 2335 kcal/day  Glucose infusion rate will be 6.5 mg/kg/min at max cyclic rate. (Maximum 7 mg/kg/min)  PLAN                                                                                                                         Now:  KCl 10 mEq IV x 6  Mag 4g IV x3  At 1800 today:  Continue Clinimix 5/15 (electrolyte free) 3000 mL/day cycled over 16 hr  50 ml/hr x 1 hr (6p-7p), 207 ml/hr x 12 hr (7p-9a), 50 ml/hr x 1 hr (9a-10a), then off  Continue sodium in TPN as patient has been receiving since Na stable: add 70 mEq NaCl and 55 mEq Na Acetate in the 3L TPN bag  Lipids at 20 mls/hr x 12 hours 3 times weekly on MWF  Standard MVI, Trace elements, and pepcid 40 mg/day in TPN  Pepcid and IV protonix for coffee ground material from NGT in early August.  In October had some blood in NGT  TPN lab panels on Mondays & Thursdays.   BMET, [place] &  Phos daily ordered.  Follow up patient's oral intake. Will continue TPN until enteral intake is meeting >60% nutrition goals.  Hershal Coria, PharmD, BCPS Pager: [phone] 08/14/2017 7:29 AM

## 2017-08-14 NOTE — Progress Notes (Signed)
Patient ID: [name] [name], male   DOB: 19-Jan-1961, 56 y.o.   MRN: [id number] [facility] Progress Note:   14 Days Post-Op  Subjective: Mental status is good and upbeat this am Objective: Vital signs in last 24 hours: Temp:  [97.6 F (36.4 C)-99 F (37.2 C)] 97.6 F (36.4 C) (10/19 1158) Pulse Rate:  [92-104] 101 (10/19 1000) Resp:  [14-21] 16 (10/19 1140) BP: (100-126)/(58-81) 107/58 (10/19 1000) SpO2:  [93 %-100 %] 96 % (10/19 1140) Weight:  [77.6 kg (171 lb 1.2 oz)-80 kg (176 lb 5.9 oz)] 80 kg (176 lb 5.9 oz) (10/19 0500)  Intake/Output from previous day: 10/18 0701 - 10/19 0700 In: 2928.6 [P.O.:250; I.V.:2128.6; IV Piggyback:550] Out: 2027 [Urine:2026; Stool:1] Intake/Output this shift: Total I/O In: 1086.8 [I.V.:1086.8] Out: 800 [Urine:800]  Physical Exam: Work of breathing is not labored.    Lab Results:  Results for orders placed or performed during the hospital encounter of 05/06/17 (from the past 48 hour(s))  Magnesium     Status: Abnormal   Collection Time: 08/13/17  7:51 AM  Result Value Ref Range   Magnesium 1.4 (L) 1.7 - 2.4 mg/dL  Phosphorus     Status: None   Collection Time: 08/13/17  7:51 AM  Result Value Ref Range   Phosphorus 4.2 2.5 - 4.6 mg/dL  Comprehensive metabolic panel     Status: Abnormal   Collection Time: 08/13/17  7:51 AM  Result Value Ref Range   Sodium 135 135 - 145 mmol/L   Potassium 3.6 3.5 - 5.1 mmol/L   Chloride 102 101 - 111 mmol/L   CO2 23 22 - 32 mmol/L   Glucose, Bld 128 (H) 65 - 99 mg/dL   BUN 21 (H) 6 - 20 mg/dL   Creatinine, Ser 0.55 (L) 0.61 - 1.24 mg/dL   Calcium 9.1 8.9 - 10.3 mg/dL   Total Protein 6.8 6.5 - 8.1 g/dL   Albumin 2.5 (L) 3.5 - 5.0 g/dL   AST 20 15 - 41 U/L   ALT 29 17 - 63 U/L   Alkaline Phosphatase 203 (H) 38 - 126 U/L   Total Bilirubin 0.5 0.3 - 1.2 mg/dL   GFR calc non Af Amer >60 >60 mL/min   GFR calc Af Amer >60 >60 mL/min    Comment: (NOTE) The eGFR has been calculated using the CKD  EPI equation. This calculation has not been validated in all clinical situations. eGFR's persistently <60 mL/min signify possible Chronic Kidney Disease.    Anion gap 10 5 - 15  Magnesium     Status: None   Collection Time: 08/14/17  3:19 AM  Result Value Ref Range   Magnesium 1.7 1.7 - 2.4 mg/dL  Phosphorus     Status: Abnormal   Collection Time: 08/14/17  3:19 AM  Result Value Ref Range   Phosphorus 5.0 (H) 2.5 - 4.6 mg/dL  Basic metabolic panel     Status: Abnormal   Collection Time: 08/14/17  3:19 AM  Result Value Ref Range   Sodium 135 135 - 145 mmol/L   Potassium 3.3 (L) 3.5 - 5.1 mmol/L   Chloride 104 101 - 111 mmol/L   CO2 23 22 - 32 mmol/L   Glucose, Bld 126 (H) 65 - 99 mg/dL   BUN 19 6 - 20 mg/dL   Creatinine, Ser 0.54 (L) 0.61 - 1.24 mg/dL   Calcium 9.0 8.9 - 10.3 mg/dL   GFR calc non Af Amer >60 >60 mL/min   GFR  calc Af Amer >60 >60 mL/min    Comment: (NOTE) The eGFR has been calculated using the CKD EPI equation. This calculation has not been validated in all clinical situations. eGFR's persistently <60 mL/min signify possible Chronic Kidney Disease.    Anion gap 8 5 - 15    Radiology/Results: No results found.  Anti-infectives: Anti-infectives    Start     Dose/Rate Route Frequency Ordered Stop   08/11/17 1400  fluconazole (DIFLUCAN) IVPB 100 mg     100 mg 50 mL/hr over 60 Minutes Intravenous Every 24 hours 08/11/17 1312     08/01/17 0600  metroNIDAZOLE (FLAGYL) IVPB 500 mg  Status:  Discontinued     500 mg 100 mL/hr over 60 Minutes Intravenous On call to O.R. 07/31/17 1656 07/31/17 1709   08/01/17 0600  ciprofloxacin (CIPRO) IVPB 400 mg  Status:  Discontinued     400 mg 200 mL/hr over 60 Minutes Intravenous On call to O.R. 07/31/17 1656 07/31/17 1709   07/31/17 0556  metroNIDAZOLE (FLAGYL) IVPB 500 mg     500 mg 100 mL/hr over 60 Minutes Intravenous On call to O.R. 07/31/17 0556 07/31/17 0840   07/31/17 0556  ciprofloxacin (CIPRO) IVPB 400 mg      400 mg 200 mL/hr over 60 Minutes Intravenous On call to O.R. 07/31/17 0556 07/31/17 0914   07/08/17 1600  fluconazole (DIFLUCAN) IVPB 100 mg     100 mg 50 mL/hr over 60 Minutes Intravenous Every 24 hours 07/08/17 0828 07/14/17 1710   07/01/17 1500  fluconazole (DIFLUCAN) IVPB 100 mg     100 mg 50 mL/hr over 60 Minutes Intravenous Every 24 hours 07/01/17 1401 07/07/17 1715   06/16/17 1200  piperacillin-tazobactam (ZOSYN) IVPB 3.375 g     3.375 g 12.5 mL/hr over 240 Minutes Intravenous Every 8 hours 06/16/17 1026 06/17/17 0004   06/08/17 1200  piperacillin-tazobactam (ZOSYN) IVPB 3.375 g  Status:  Discontinued     3.375 g 12.5 mL/hr over 240 Minutes Intravenous Every 8 hours 06/08/17 1056 06/16/17 1026   05/29/17 2200  ceFEPIme (MAXIPIME) 2 g in dextrose 5 % 50 mL IVPB  Status:  Discontinued     2 g 100 mL/hr over 30 Minutes Intravenous Every 8 hours 05/29/17 2031 06/03/17 0832   05/29/17 2100  metroNIDAZOLE (FLAGYL) IVPB 500 mg  Status:  Discontinued     500 mg 100 mL/hr over 60 Minutes Intravenous Every 8 hours 05/29/17 2029 06/03/17 0832   05/26/17 2200  ceFAZolin (ANCEF) IVPB 1 g/50 mL premix  Status:  Discontinued     1 g 100 mL/hr over 30 Minutes Intravenous Every 8 hours 05/26/17 1008 05/29/17 2023   05/25/17 1100  vancomycin (VANCOCIN) 1,250 mg in sodium chloride 0.9 % 250 mL IVPB  Status:  Discontinued     1,250 mg 166.7 mL/hr over 90 Minutes Intravenous Every 12 hours 05/25/17 0955 05/26/17 0946   05/22/17 1400  anidulafungin (ERAXIS) 100 mg in sodium chloride 0.9 % 100 mL IVPB     100 mg 78 mL/hr over 100 Minutes Intravenous Every 24 hours 05/21/17 1330 05/27/17 1740   05/21/17 1400  anidulafungin (ERAXIS) 200 mg in sodium chloride 0.9 % 200 mL IVPB     200 mg 78 mL/hr over 200 Minutes Intravenous  Once 05/21/17 1330 05/21/17 1940   05/15/17 1000  anidulafungin (ERAXIS) 100 mg in sodium chloride 0.9 % 100 mL IVPB  Status:  Discontinued     100 mg 78 mL/hr over 100 Minutes  Intravenous Every 24 hours 05/14/17 0829 05/16/17 0901   05/15/17 1000  metroNIDAZOLE (FLAGYL) IVPB 500 mg  Status:  Discontinued     500 mg 100 mL/hr over 60 Minutes Intravenous Every 8 hours 05/15/17 0903 05/25/17 0938   05/14/17 0900  anidulafungin (ERAXIS) 200 mg in sodium chloride 0.9 % 200 mL IVPB     200 mg 78 mL/hr over 200 Minutes Intravenous  Once 05/14/17 0829 05/14/17 1325   05/13/17 1927  vancomycin (VANCOCIN) 1-5 GM/200ML-% IVPB    Comments:  Ward, Christa   : cabinet override      05/13/17 1927 05/14/17 0744   05/12/17 1400  ceFEPIme (MAXIPIME) 1 g in dextrose 5 % 50 mL IVPB     1 g 100 mL/hr over 30 Minutes Intravenous Every 8 hours 05/12/17 0939 05/26/17 2359   05/12/17 0900  vancomycin (VANCOCIN) IVPB 1000 mg/200 mL premix  Status:  Discontinued     1,000 mg 200 mL/hr over 60 Minutes Intravenous Every 12 hours 05/12/17 0750 05/16/17 0852   05/12/17 0830  aztreonam (AZACTAM) 2 GM IVPB     2 g 100 mL/hr over 30 Minutes Intravenous  Once 05/12/17 0800 05/12/17 0957   05/06/17 0916  vancomycin (VANCOCIN) IVPB 1000 mg/200 mL premix     1,000 mg 200 mL/hr over 60 Minutes Intravenous On call to O.R. 05/06/17 0916 05/06/17 1229      Assessment/Plan: Problem List: Patient Active Problem List   Diagnosis Date Noted  . Hypomagnesemia 06/28/2017  . Hypothyroidism   . 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 Protein-calorie malnutrition, severe ([place]) 06/27/2017  . 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 Pressure injury of skin 05/29/2017  . Ileus ([place])   . Hypokalemia 05/14/2017  . Anastomotic leak of intestine 05/14/2017  . Wound dehiscence, surgical 05/14/2017  . Aspiration pneumonia ([place]) 05/14/2017  . Chronic narcotic use 05/10/2017  . Generalized anxiety disorder 05/10/2017  . Intra-abdominal adhesions s/p SB resection 05/06/2017 05/09/2017  . Throat symptom 03/18/2016  . Multinodular goiter 01/19/2016  . Hyperthyroidism  01/19/2016  . Essential hypertension   . Diarrhea 11/30/2014  . Anemia, iron deficiency 11/01/2014  . Leukocytosis 11/03/2013  . Tobacco abuse 07/26/2013  . COPD (chronic obstructive pulmonary disease) ([place]) 07/26/2013  . Left knee pain 07/26/2013  . Pain in joint, shoulder region 05/03/2013  . GERD 07/24/2008  . 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    Will advance to regular diet;  Mobilize and try to walk 14 Days Post-Op    LOS: 100 days   Matt B. Hassell Done, MD, [facility] [phone] beeper [phone]  08/14/2017 1:09 PM

## 2017-08-15 LAB — BASIC METABOLIC PANEL
ANION GAP: 6 (ref 5–15)
BUN: 21 mg/dL — AB (ref 6–20)
CHLORIDE: 107 mmol/L (ref 101–111)
CO2: 23 mmol/L (ref 22–32)
Calcium: 9 mg/dL (ref 8.9–10.3)
Creatinine, Ser: 0.66 mg/dL (ref 0.61–1.24)
Glucose, Bld: 144 mg/dL — ABNORMAL HIGH (ref 65–99)
POTASSIUM: 4 mmol/L (ref 3.5–5.1)
SODIUM: 136 mmol/L (ref 135–145)

## 2017-08-15 LAB — MAGNESIUM: MAGNESIUM: 1.5 mg/dL — AB (ref 1.7–2.4)

## 2017-08-15 LAB — PHOSPHORUS: PHOSPHORUS: 4.9 mg/dL — AB (ref 2.5–4.6)

## 2017-08-15 MED ORDER — POTASSIUM CHLORIDE 10 MEQ/50ML IV SOLN
10.0000 meq | INTRAVENOUS | Status: AC
  Administered 2017-08-15 (×4): 10 meq via INTRAVENOUS
  Filled 2017-08-15 (×4): qty 50

## 2017-08-15 MED ORDER — TRACE MINERALS CR-CU-MN-SE-ZN 10-1000-500-60 MCG/ML IV SOLN
INTRAVENOUS | Status: AC
  Administered 2017-08-15: 18:00:00 via INTRAVENOUS
  Filled 2017-08-15: qty 2000
  Filled 2017-08-15: qty 3000

## 2017-08-15 MED ORDER — KETOROLAC TROMETHAMINE 15 MG/ML IJ SOLN
15.0000 mg | Freq: Once | INTRAMUSCULAR | Status: AC
  Administered 2017-08-15: 15 mg via INTRAVENOUS
  Filled 2017-08-15: qty 1

## 2017-08-15 MED ORDER — MAGNESIUM SULFATE 4 GM/100ML IV SOLN
4.0000 g | INTRAVENOUS | Status: AC
  Administered 2017-08-15 (×3): 4 g via INTRAVENOUS
  Filled 2017-08-15 (×3): qty 100

## 2017-08-15 NOTE — Progress Notes (Signed)
[facility] Office:  [phone] General Surgery Progress Note   LOS: 101 days  POD -  15 Days Post-Op  Chief Complaint: abdominal pain  Assessment and Plan: 1.   Laparoscopy, laparotomy, extensive enterolysis with removal of mesh (partially) in the right upper quadrant; resection of old anastomosis and ileotransverse anastomosis - 07/31/2017 - Hassell Done  Complex hospital course  Taking po's - to get out of bed more and push to walk  2.  OPEN RIGHT ADRENALECTOMY WITH SMALL BOWEL RESECTION AND ANASTOMOSIS - 05/06/2017 - [name]             Additional surgery - 05/13/2017, 05/14/2017, 05/17/2017 and 05/20/2017       3.  Malnutrition             On TPN              Prealbumin - 08/10/2017 - 15.6 4. HTN 5. COPD/Smokes             Bronchoscopy by Dr. Despina Hick - 05/22/2017 5.  Anemia             Hgb - 9.1 -08/10/2017 6. On chronic pain meds Prior to admission - he took about 40 mg oxycodone per day for neck - goes to pain clinic - sees Dr. [name] for neck 7. 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 Active Problems:   GERD   Tobacco abuse   COPD (chronic obstructive pulmonary disease) (H[facility])   Anemia, iron deficiency   Essential hypertension   Hyperthyroidism   Intra-abdominal adhesions s/p SB resection 05/06/2017   Chronic narcotic use   Generalized anxiety disorder   Hypokalemia   Anastomotic leak of intestine   Wound dehiscence, surgical   Aspiration pneumonia ([facility])   Ileus ([facility])   Pressure injury of skin   Protein-calorie malnutrition, severe ([facility])   Pheochromocytoma, right, 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   Hypomagnesemia   Hypothyroidism   Anxiety state  Subjective:  No specific complaint.  He is voiding on his own. Had BM.  Taking small amounts of reg food.  Objective:   Vitals:   08/15/17 0715 08/15/17 0800  BP:  112/75  Pulse:    Resp:  17  Temp: 98.3 F (36.8  C)   SpO2:  98%     Intake/Output from previous day:  10/19 0701 - 10/20 0700 In: 4140.6 [I.V.:3540.6; IV Piggyback:600] Out: 1443 [Urine:1750]  Intake/Output this shift:  Total I/O In: -  Out: 375 [Urine:375]   Physical Exam:   General: AA M who is alert and oriented.    HEENT: Normal. Pupils equal. .   Lungs: Clear.  Inc Spi - 1,200 cc   Abdomen: Soft.  Has BS.   Wound: RUQ incision - relatively clean, some mesh exposed laterally.  Midline wound with retention sutures and clean.   Lab Results:   No results for input(s): WBC, HGB, HCT, PLT in the last 72 hours.  BMET   Recent Labs  08/14/17 0319 08/15/17 0515  NA 135 136  K 3.3* 4.0  CL 104 107  CO2 23 23  GLUCOSE 126* 144*  BUN 19 21*  CREATININE 0.54* 0.66  CALCIUM 9.0 9.0    PT/INR  No results for input(s): LABPROT, INR in the last 72 hours.  ABG  No results for input(s): PHART, HCO3 in the last 72 hours.  Invalid input(s): PCO2, PO2   Studies/Results:  No results found.  Anti-infectives:   Anti-infectives    Start     Dose/Rate Route Frequency Ordered Stop   08/11/17 1400  fluconazole (DIFLUCAN) IVPB 100 mg     100 mg 50 mL/hr over 60 Minutes Intravenous Every 24 hours 08/11/17 1312     08/01/17 0600  metroNIDAZOLE (FLAGYL) IVPB 500 mg  Status:  Discontinued     500 mg 100 mL/hr over 60 Minutes Intravenous On call to O.R. 07/31/17 1656 07/31/17 1709   08/01/17 0600  ciprofloxacin (CIPRO) IVPB 400 mg  Status:  Discontinued     400 mg 200 mL/hr over 60 Minutes Intravenous On call to O.R. 07/31/17 1656 07/31/17 1709   07/31/17 0556  metroNIDAZOLE (FLAGYL) IVPB 500 mg     500 mg 100 mL/hr over 60 Minutes Intravenous On call to O.R. 07/31/17 0556 07/31/17 0840   07/31/17 0556  ciprofloxacin (CIPRO) IVPB 400 mg     400 mg 200 mL/hr over 60 Minutes Intravenous On call to O.R. 07/31/17 0556 07/31/17 0914   07/08/17 1600  fluconazole (DIFLUCAN) IVPB 100 mg     100 mg 50 mL/hr over 60 Minutes  Intravenous Every 24 hours 07/08/17 0828 07/14/17 1710   07/01/17 1500  fluconazole (DIFLUCAN) IVPB 100 mg     100 mg 50 mL/hr over 60 Minutes Intravenous Every 24 hours 07/01/17 1401 07/07/17 1715   06/16/17 1200  piperacillin-tazobactam (ZOSYN) IVPB 3.375 g     3.375 g 12.5 mL/hr over 240 Minutes Intravenous Every 8 hours 06/16/17 1026 06/17/17 0004   06/08/17 1200  piperacillin-tazobactam (ZOSYN) IVPB 3.375 g  Status:  Discontinued     3.375 g 12.5 mL/hr over 240 Minutes Intravenous Every 8 hours 06/08/17 1056 06/16/17 1026   05/29/17 2200  ceFEPIme (MAXIPIME) 2 g in dextrose 5 % 50 mL IVPB  Status:  Discontinued     2 g 100 mL/hr over 30 Minutes Intravenous Every 8 hours 05/29/17 2031 06/03/17 0832   05/29/17 2100  metroNIDAZOLE (FLAGYL) IVPB 500 mg  Status:  Discontinued     500 mg 100 mL/hr over 60 Minutes Intravenous Every 8 hours 05/29/17 2029 06/03/17 0832   05/26/17 2200  ceFAZolin (ANCEF) IVPB 1 g/50 mL premix  Status:  Discontinued     1 g 100 mL/hr over 30 Minutes Intravenous Every 8 hours 05/26/17 1008 05/29/17 2023   05/25/17 1100  vancomycin (VANCOCIN) 1,250 mg in sodium chloride 0.9 % 250 mL IVPB  Status:  Discontinued     1,250 mg 166.7 mL/hr over 90 Minutes Intravenous Every 12 hours 05/25/17 0955 05/26/17 0946   05/22/17 1400  anidulafungin (ERAXIS) 100 mg in sodium chloride 0.9 % 100 mL IVPB     100 mg 78 mL/hr over 100 Minutes Intravenous Every 24 hours 05/21/17 1330 05/27/17 1740   05/21/17 1400  anidulafungin (ERAXIS) 200 mg in sodium chloride 0.9 % 200 mL IVPB     200 mg 78 mL/hr over 200 Minutes Intravenous  Once 05/21/17 1330 05/21/17 1940   05/15/17 1000  anidulafungin (ERAXIS) 100 mg in sodium chloride 0.9 % 100 mL IVPB  Status:  Discontinued     100 mg 78 mL/hr over 100 Minutes Intravenous Every 24 hours 05/14/17 0829 05/16/17 0901   05/15/17 1000  metroNIDAZOLE (FLAGYL) IVPB 500 mg  Status:  Discontinued     500 mg 100 mL/hr over 60 Minutes Intravenous  Every 8 hours 05/15/17 0903 05/25/17 0938   05/14/17 0900  anidulafungin (ERAXIS) 200 mg in sodium chloride 0.9 %  200 mL IVPB     200 mg 78 mL/hr over 200 Minutes Intravenous  Once 05/14/17 0829 05/14/17 1325   05/13/17 1927  vancomycin (VANCOCIN) 1-5 GM/200ML-% IVPB    Comments:  Ward, Christa   : cabinet override      05/13/17 1927 05/14/17 0744   05/12/17 1400  ceFEPIme (MAXIPIME) 1 g in dextrose 5 % 50 mL IVPB     1 g 100 mL/hr over 30 Minutes Intravenous Every 8 hours 05/12/17 0939 05/26/17 2359   05/12/17 0900  vancomycin (VANCOCIN) IVPB 1000 mg/200 mL premix  Status:  Discontinued     1,000 mg 200 mL/hr over 60 Minutes Intravenous Every 12 hours 05/12/17 0750 05/16/17 0852   05/12/17 0830  aztreonam (AZACTAM) 2 GM IVPB     2 g 100 mL/hr over 30 Minutes Intravenous  Once 05/12/17 0800 05/12/17 0957   05/06/17 0916  vancomycin (VANCOCIN) IVPB 1000 mg/200 mL premix     1,000 mg 200 mL/hr over 60 Minutes Intravenous On call to O.R. 05/06/17 0916 05/06/17 1229      Alphonsa Overall, MD, FACS Pager: [facility] Office: [phone] 08/15/2017

## 2017-08-15 NOTE — Progress Notes (Signed)
[facility] NOTE   Pharmacy Consult for TPN Indication: prolonged ileus, small bowel leak, multiple bowel surgeries  Patient Measurements: Body mass index is 25.31 kg/m. Filed Weights   08/13/17 1343 08/14/17 0500 08/15/17 0405  Weight: 171 lb 1.2 oz (77.6 kg) 176 lb 5.9 oz (80 kg) 176 lb 5.9 oz (80 kg)   HPI: 11 yoM admitted on 7/11 for enlarging adrenal mass and planned adrenalectomy.  Pharmacy consulted to dose TPN.  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   7/22 OR:  wash out, tube ileostomy, left with open abdomen & wound vac in place.  Plan for OR on 7/25 for closure 7/25 OR: mesh placed for abdominal closure, wound vac 8/2 extubated, propofol drip off 8/4 TPN off for ~ 5 hours due to line detached from filter 8/10 MD request decrease rate x 1 week 2nd low sodium 8/12 Additional sodium added to TPN due to persistent hyponatremia 8/16 Increasing rate back to goal since sodium has improved with above intervention.   8/20 Prealbumin decreased.  New apparent enteric leak per surgery notes. 8/21 CaPhos product elevated, remove electrolytes from TPN 8/25 PICC did not have blood return in 2 of the 3 ports.  Alteplase intra-catheter and function returned.  TPN infusing correctly on 8/26 per RN. 8/28: d/c SSI 8/30: added electrolytes to TPN 8/31: TPN IV line came out around ~0340 and current bag was stopped at this time.  Restart new clinimix 5/15 1L bag (no lytes since phos is elevated this morning) until next bag is due at [facility]. Will not add MVI and trace elements to this 1L bag.  Use electrolyte free clinimix with new bag at [facility] 9/1 NGT out 9/3 added electrolytes to TPN 9/4 lytes out of TPN 9/7 cycle over 18 hrs 9/8 add back electrolytes to TPN.  Cycle over 14 hours 9/9 lytes out of TPN, continue 14 hr cycle, DC SSI/CBGs 9/13 electrolytes added back to TPN 9/14 remove electrolytes from TPN 10/3 Adjusted estimated needs based on prealbumin (decreased on 9/24, low but slightly improved on 10/1) and attempt to maximize nutrition prior to surgery planned for 07/31/17 - required increase of cycle to 16 hours. 10/5 to OR for fistula-repair surgery 10/6: Lytes added back to TPN with low Phos 10/10 D/C CBGs 10/14 NGT clamp trial 10/16: CLD ordered.  Per RN notes patient tolerated liquids, ice chips, small amnt jello 10/17: NGT removed 10/19: Diet advanced to Regular diet   Insulin requirements past 24 hours: none  Current Nutrition: regular diet ordered to start 10/19, Boost, Ensure & Magic Cup daily  IVF: none  Central access: PICC line ordered 7/19, placed on 7/20 TPN start date: 7/20  ASSESSMENT  Today, 08/15/17  - Glucose:  (goal 100-150) At goal.  SSI/CBGs discontinued 10/10. - Electrolytes:  K 4.0  - daily IV replacement  Mag 1.5  - daily IV replacement  Phos 4.9 - has been variable, lytes removed from TPN to avoid high Phos levels  Na WNL- have been providing ~42 mEq/L Na in TPN  Corr Ca 10.2; Ca/phos product  (goal < 55) - Renal:  SCr wnl, stable - NGT removed. Weight looks OK. Using some PRN Zofran, no emesis documented. Has IV Reglan scheduled TID.  - + BMs  - LFTs:  WNL except Alk Phos which is elevated - TGs:  Slightly elevated prior to starting TPN, but now WNL- 148 on 10/15 - Prealbumin: Low on admission but trended up and remained at or near goal during Aug to Sep; now decreased but trending back to goal (15.6 on 10/15).   - Dietitian  has ordered daily Boost, Ensure and recommended ordering Magic Cup BID with meals. Patient has drank 1 each over past 2 days.  Ate chicken wings brought in by family on 10/18.   NUTRITIONAL GOALS                                                                                             RD recs (10/8):  137-153 gms Protein (1.7 - 1.9 g/kg) 2030 - 2270 Kcal (25-28 kcal/kg)  Recalculation (10/3): Clinimix 5/15 3000 mL over 16h cycle with lipids 3x/week on MWF only would provide: 150 g protein and avg 2335 kcal/day  Glucose infusion rate will be 6.5 mg/kg/min at max cyclic rate. (Maximum 7 mg/kg/min)  PLAN                                                                                                                         Now:  KCl 10 mEq IV x 4  Mag 4g IV x3  At 1800 today:  Continue Clinimix 5/15 (electrolyte free) 3000 mL/day cycled over 16 hr  50 ml/hr x 1 hr (6p-7p), 207 ml/hr x 12 hr (7p-9a), 50 ml/hr x 1 hr (9a-10a), then off  Continue sodium in TPN as patient has been receiving since Na stable: add 70 mEq NaCl and 55 mEq Na Acetate in the 3L TPN bag  Lipids at 20 mls/hr x 12 hours 3 times weekly on MWF  Standard MVI, Trace elements, and pepcid 40 mg/day in TPN  Pepcid and IV protonix for coffee ground material from NGT in early August.  In October had some blood in NGT  TPN lab panels on Mondays & Thursdays.   BMET, Mag & Phos  daily ordered.  Follow up patient's oral intake. Will continue TPN until enteral intake is meeting >60% nutrition goals.  Netta Cedars, PharmD, BCPS Pager: [phone] 08/15/2017 @ 9:24 AM

## 2017-08-15 NOTE — Progress Notes (Signed)
Pt complaining of lower mid abdomen pain and left groin pain. Pt's lowest plastic stitch/holder that is closing the wound is digging into pt's skin a bit, RN put gauze underneath plastic and in btw skin to give pt comfort. States he cannot get up with this pain. MD notified, dopplers ordered for DVT check (left groin pain).

## 2017-08-16 ENCOUNTER — Inpatient Hospital Stay (HOSPITAL_COMMUNITY): Payer: Medicare Other

## 2017-08-16 LAB — BASIC METABOLIC PANEL
ANION GAP: 9 (ref 5–15)
BUN: 21 mg/dL — ABNORMAL HIGH (ref 6–20)
CALCIUM: 9.1 mg/dL (ref 8.9–10.3)
CO2: 23 mmol/L (ref 22–32)
CREATININE: 0.53 mg/dL — AB (ref 0.61–1.24)
Chloride: 105 mmol/L (ref 101–111)
GFR calc non Af Amer: >60 mL/min (ref >60)
Glucose, Bld: 102 mg/dL — ABNORMAL HIGH (ref 65–99)
Potassium: 3.5 mmol/L (ref 3.5–5.1)
SODIUM: 137 mmol/L (ref 135–145)

## 2017-08-16 LAB — PHOSPHORUS: Phosphorus: 5.1 mg/dL — ABNORMAL HIGH (ref 2.5–4.6)

## 2017-08-16 LAB — MAGNESIUM: Magnesium: 1.7 mg/dL (ref 1.7–2.4)

## 2017-08-16 MED ORDER — MAGNESIUM SULFATE 4 GM/100ML IV SOLN
4.0000 g | INTRAVENOUS | Status: AC
  Administered 2017-08-16 (×3): 4 g via INTRAVENOUS
  Filled 2017-08-16 (×4): qty 100

## 2017-08-16 MED ORDER — GABAPENTIN 300 MG PO CAPS
300.0000 mg | ORAL_CAPSULE | Freq: Every day | ORAL | Status: AC
  Filled 2017-08-16 (×2): qty 1

## 2017-08-16 MED ORDER — PSYLLIUM 95 % PO PACK
1.0000 | PACK | Freq: Every day | ORAL | Status: DC
  Administered 2017-08-28 – 2017-08-29 (×2): 1 via ORAL
  Filled 2017-08-16 (×15): qty 1

## 2017-08-16 MED ORDER — ENALAPRILAT 1.25 MG/ML IV SOLN: 0.6250 mg | Freq: Four times a day (QID) | INTRAVENOUS | Status: DC | PRN

## 2017-08-16 MED ORDER — GUAIFENESIN-DM 100-10 MG/5ML PO SYRP: 10.0000 mL | ORAL_SOLUTION | ORAL | Status: DC | PRN

## 2017-08-16 MED ORDER — NICOTINE 14 MG/24HR TD PT24
14.0000 mg | MEDICATED_PATCH | Freq: Every day | TRANSDERMAL | Status: AC
  Administered 2017-08-17 – 2017-08-18 (×2): 14 mg via TRANSDERMAL
  Filled 2017-08-16 (×2): qty 1

## 2017-08-16 MED ORDER — NALOXONE HCL 0.4 MG/ML IJ SOLN: 0.4000 mg | INTRAMUSCULAR | Status: DC | PRN

## 2017-08-16 MED ORDER — POTASSIUM CHLORIDE 10 MEQ/50ML IV SOLN
10.0000 meq | INTRAVENOUS | Status: AC
  Administered 2017-08-16 (×6): 10 meq via INTRAVENOUS
  Filled 2017-08-16 (×6): qty 50

## 2017-08-16 MED ORDER — ATORVASTATIN CALCIUM 10 MG PO TABS
10.0000 mg | ORAL_TABLET | Freq: Every day | ORAL | Status: DC
  Filled 2017-08-16: qty 1

## 2017-08-16 MED ORDER — PANTOPRAZOLE SODIUM 40 MG PO TBEC: 40.0000 mg | DELAYED_RELEASE_TABLET | Freq: Every day | ORAL | Status: DC

## 2017-08-16 MED ORDER — ADULT MULTIVITAMIN W/MINERALS CH
1.0000 | ORAL_TABLET | Freq: Every day | ORAL | Status: DC
  Administered 2017-08-21 – 2017-09-03 (×8): 1 via ORAL
  Filled 2017-08-16 (×12): qty 1

## 2017-08-16 MED ORDER — LABETALOL HCL 100 MG PO TABS
50.0000 mg | ORAL_TABLET | Freq: Two times a day (BID) | ORAL | Status: DC
  Administered 2017-08-17 – 2017-08-22 (×4): 50 mg via ORAL
  Filled 2017-08-16 (×9): qty 1

## 2017-08-16 MED ORDER — LORAZEPAM 1 MG PO TABS
1.0000 mg | ORAL_TABLET | Freq: Four times a day (QID) | ORAL | Status: DC | PRN
Start: 2017-08-16 — End: 2017-08-16

## 2017-08-16 MED ORDER — SODIUM CHLORIDE 0.9% FLUSH: 9.0000 mL | INTRAVENOUS | Status: DC | PRN

## 2017-08-16 MED ORDER — HYDROCORTISONE 2.5 % RE CREA: 1.0000 "application " | TOPICAL_CREAM | Freq: Four times a day (QID) | RECTAL | Status: DC | PRN

## 2017-08-16 MED ORDER — ALUM & MAG HYDROXIDE-SIMETH 200-200-20 MG/5ML PO SUSP: 30.0000 mL | Freq: Four times a day (QID) | ORAL | Status: DC | PRN

## 2017-08-16 MED ORDER — OLANZAPINE 5 MG PO TBDP
10.0000 mg | ORAL_TABLET | Freq: Every day | ORAL | Status: DC
  Filled 2017-08-16 (×4): qty 2

## 2017-08-16 MED ORDER — METOCLOPRAMIDE HCL 5 MG/ML IJ SOLN: 10.0000 mg | Freq: Four times a day (QID) | INTRAMUSCULAR | Status: DC | PRN

## 2017-08-16 MED ORDER — SODIUM CHLORIDE 0.9 % IV SOLN: 25.0000 mg | Freq: Four times a day (QID) | INTRAVENOUS | Status: DC | PRN

## 2017-08-16 MED ORDER — GABAPENTIN 100 MG PO CAPS: 200.0000 mg | ORAL_CAPSULE | Freq: Two times a day (BID) | ORAL | Status: DC

## 2017-08-16 MED ORDER — LACTATED RINGERS IV BOLUS (SEPSIS): 1000.0000 mL | Freq: Three times a day (TID) | INTRAVENOUS | Status: AC | PRN

## 2017-08-16 MED ORDER — WITCH HAZEL-GLYCERIN EX PADS: 1.0000 "application " | MEDICATED_PAD | CUTANEOUS | Status: DC | PRN

## 2017-08-16 MED ORDER — ACETAMINOPHEN 500 MG PO TABS
1000.0000 mg | ORAL_TABLET | Freq: Three times a day (TID) | ORAL | Status: DC
  Administered 2017-08-20: 1000 mg via ORAL
  Filled 2017-08-16 (×5): qty 2

## 2017-08-16 MED ORDER — LORAZEPAM 2 MG/ML IJ SOLN
1.0000 mg | Freq: Four times a day (QID) | INTRAMUSCULAR | Status: DC | PRN
  Administered 2017-08-17 – 2017-08-20 (×9): 2 mg via INTRAVENOUS
  Filled 2017-08-16 (×9): qty 1

## 2017-08-16 MED ORDER — HYDRALAZINE HCL 20 MG/ML IJ SOLN: 5.0000 mg | Freq: Four times a day (QID) | INTRAMUSCULAR | Status: DC | PRN

## 2017-08-16 MED ORDER — SACCHAROMYCES BOULARDII 250 MG PO CAPS
250.0000 mg | ORAL_CAPSULE | Freq: Two times a day (BID) | ORAL | Status: DC
  Filled 2017-08-16 (×4): qty 1

## 2017-08-16 MED ORDER — MAGIC MOUTHWASH: 15.0000 mL | Freq: Four times a day (QID) | ORAL | Status: DC | PRN

## 2017-08-16 MED ORDER — PHENOL 1.4 % MT LIQD: 1.0000 | OROMUCOSAL | Status: DC | PRN

## 2017-08-16 MED ORDER — ONDANSETRON HCL 4 MG/2ML IJ SOLN
4.0000 mg | Freq: Four times a day (QID) | INTRAMUSCULAR | Status: DC | PRN
  Administered 2017-08-17: 4 mg via INTRAVENOUS
  Filled 2017-08-16: qty 2

## 2017-08-16 MED ORDER — MENTHOL 3 MG MT LOZG: 1.0000 | LOZENGE | OROMUCOSAL | Status: DC | PRN

## 2017-08-16 MED ORDER — METHOCARBAMOL 500 MG PO TABS
750.0000 mg | ORAL_TABLET | Freq: Three times a day (TID) | ORAL | Status: DC
  Administered 2017-08-20 – 2017-08-22 (×3): 750 mg via ORAL
  Filled 2017-08-16 (×11): qty 2

## 2017-08-16 MED ORDER — METOCLOPRAMIDE HCL 5 MG/ML IJ SOLN
5.0000 mg | Freq: Four times a day (QID) | INTRAMUSCULAR | Status: DC | PRN
  Administered 2017-08-17: 10 mg via INTRAVENOUS
  Filled 2017-08-16: qty 2

## 2017-08-16 MED ORDER — METHOCARBAMOL 500 MG PO TABS: 500.0000 mg | ORAL_TABLET | Freq: Two times a day (BID) | ORAL | Status: DC

## 2017-08-16 MED ORDER — LIP MEDEX EX OINT: 1.0000 "application " | TOPICAL_OINTMENT | Freq: Two times a day (BID) | CUTANEOUS | Status: DC

## 2017-08-16 MED ORDER — FENTANYL 40 MCG/ML IV SOLN
INTRAVENOUS | Status: DC
  Administered 2017-08-16: 23:00:00 via INTRAVENOUS
  Administered 2017-08-17: 300 ug via INTRAVENOUS
  Administered 2017-08-17: 1000 ug via INTRAVENOUS
  Administered 2017-08-17: 225 ug via INTRAVENOUS
  Administered 2017-08-17: 21:00:00 via INTRAVENOUS
  Administered 2017-08-17: 442.2 ug via INTRAVENOUS
  Administered 2017-08-17: 506.9 ug via INTRAVENOUS
  Administered 2017-08-18: 450 ug via INTRAVENOUS
  Administered 2017-08-18: 13:00:00 via INTRAVENOUS
  Administered 2017-08-18: 225 ug via INTRAVENOUS
  Administered 2017-08-18: 300 ug via INTRAVENOUS
  Administered 2017-08-18: 150 ug via INTRAVENOUS
  Administered 2017-08-18: 450 ug via INTRAVENOUS
  Administered 2017-08-18 (×2): 1000 ug via INTRAVENOUS
  Administered 2017-08-18: 375 ug via INTRAVENOUS
  Administered 2017-08-19: 225 ug via INTRAVENOUS
  Administered 2017-08-19: 40 mL via INTRAVENOUS
  Administered 2017-08-19: 225 ug via INTRAVENOUS
  Administered 2017-08-19: 160.9 ug via INTRAVENOUS
  Administered 2017-08-19: 21:00:00 via INTRAVENOUS
  Administered 2017-08-19: 225 ug via INTRAVENOUS
  Administered 2017-08-19: 364 ug via INTRAVENOUS
  Administered 2017-08-19: 450 ug via INTRAVENOUS
  Administered 2017-08-19: 273 ug via INTRAVENOUS
  Administered 2017-08-20: 300 ug via INTRAVENOUS
  Administered 2017-08-20: 1000 ug via INTRAVENOUS
  Administered 2017-08-20: 175 ug via INTRAVENOUS
  Administered 2017-08-20 (×2): 0 ug via INTRAVENOUS
  Administered 2017-08-21: 150 ug via INTRAVENOUS
  Filled 2017-08-16 (×9): qty 25

## 2017-08-16 MED ORDER — DIPHENHYDRAMINE HCL 50 MG/ML IJ SOLN: 12.5000 mg | Freq: Four times a day (QID) | INTRAMUSCULAR | Status: DC | PRN

## 2017-08-16 MED ORDER — TRACE MINERALS CR-CU-MN-SE-ZN 10-1000-500-60 MCG/ML IV SOLN
INTRAVENOUS | Status: AC
  Administered 2017-08-16: 18:00:00 via INTRAVENOUS
  Filled 2017-08-16: qty 2000

## 2017-08-16 MED ORDER — KETOROLAC TROMETHAMINE 15 MG/ML IJ SOLN
15.0000 mg | Freq: Three times a day (TID) | INTRAMUSCULAR | Status: AC | PRN
Start: 2017-08-16 — End: 2017-08-21

## 2017-08-16 MED ORDER — DIPHENHYDRAMINE HCL 12.5 MG/5ML PO ELIX: 12.5000 mg | ORAL_SOLUTION | Freq: Four times a day (QID) | ORAL | Status: DC | PRN

## 2017-08-16 MED ORDER — DIAZEPAM 5 MG PO TABS
10.0000 mg | ORAL_TABLET | Freq: Two times a day (BID) | ORAL | Status: DC
  Administered 2017-08-18 – 2017-08-23 (×7): 10 mg via ORAL
  Filled 2017-08-16 (×11): qty 2

## 2017-08-16 MED ORDER — BISACODYL 10 MG RE SUPP: 10.0000 mg | Freq: Two times a day (BID) | RECTAL | Status: DC | PRN

## 2017-08-16 MED ORDER — HYDROCORTISONE 1 % EX CREA: 1.0000 "application " | TOPICAL_CREAM | Freq: Three times a day (TID) | CUTANEOUS | Status: DC | PRN

## 2017-08-16 MED ORDER — SIMETHICONE 40 MG/0.6ML PO SUSP
40.0000 mg | Freq: Four times a day (QID) | ORAL | Status: DC | PRN
  Filled 2017-08-16: qty 0.6

## 2017-08-16 NOTE — Progress Notes (Signed)
Attempted to give ordered p.o medications at 9pm patient and family not aware he was advanced to p.o medications and did not feel comfortable with this. Patient did refuse all p.o medications.

## 2017-08-16 NOTE — Progress Notes (Signed)
[place] NOTE   Pharmacy Consult for TPN Indication: prolonged ileus, small bowel leak, multiple bowel surgeries  Patient Measurements: Body mass index is 25.62 kg/m. Filed Weights   08/14/17 0500 08/15/17 0405 08/16/17 0400  Weight: 176 lb 5.9 oz (80 kg) 176 lb 5.9 oz (80 kg) 178 lb 9.2 oz (81 kg)   HPI: 48 yoM admitted on 7/11 for enlarging adrenal mass and planned adrenalectomy.  Pharmacy consulted to dose TPN.  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   7/22 OR:  wash out, tube ileostomy, left with open abdomen & wound vac in place.  Plan for OR on 7/25 for closure 7/25 OR: mesh placed for abdominal closure, wound vac 8/2 extubated, propofol drip off 8/4 TPN off for ~ 5 hours due to line detached from filter 8/10 MD request decrease rate x 1 week 2nd low sodium 8/12 Additional sodium added to TPN due to persistent hyponatremia 8/16 Increasing rate back to goal since sodium has improved with above intervention.   8/20 Prealbumin decreased.  New apparent enteric leak per surgery notes. 8/21 CaPhos product elevated, remove electrolytes from TPN 8/25 PICC did not have blood return in 2 of the 3 ports.  Alteplase intra-catheter and function returned.  TPN infusing correctly on 8/26 per RN. 8/28: d/c SSI 8/30: added electrolytes to TPN 8/31: TPN IV line came out around ~0340 and current bag was stopped at this time.  Restart new clinimix 5/15 1L bag (no lytes since phos is elevated this morning) until next bag is [facility] at [facility]. Will not add MVI and trace elements to this 1L bag. Use  electrolyte free clinimix with new bag at [facility] 9/1 NGT out 9/3 added electrolytes to TPN 9/4 lytes out of TPN 9/7 cycle over 18 hrs 9/8 add back electrolytes to TPN.  Cycle over 14 hours 9/9 lytes out of TPN, continue 14 hr cycle, DC SSI/CBGs 9/13 electrolytes added back to TPN 9/14 remove electrolytes from TPN 10/3 Adjusted estimated needs based on prealbumin (decreased on 9/24, low but slightly improved on 10/1) and attempt to maximize nutrition prior to surgery planned for 07/31/17 - required increase of cycle to 16 hours. 10/5 to OR for fistula-repair surgery 10/6: Lytes added back to TPN with low Phos 10/10 D/C CBGs 10/14 NGT clamp trial 10/16: CLD ordered.  Per RN notes patient tolerated liquids, ice chips, small amnt jello 10/17: NGT removed 10/19: Diet advanced to Regular diet   Insulin requirements past 24 hours: none  Current Nutrition: regular diet ordered to start 10/19, Boost, Ensure & Magic Cup daily  IVF: none  Central access: PICC line ordered 7/19, placed on 7/20 TPN start date: 7/20  ASSESSMENT  Today, 08/16/17  - Glucose:  (goal 100-150) At goal.  SSI/CBGs discontinued 10/10. - Electrolytes:  K 3.5  - daily IV replacement  Mag 1.7  - daily IV replacement  Phos 5.1- has been variable, lytes removed from TPN to avoid high Phos levels  Na WNL- have been providing ~42 mEq/L Na in TPN  Corr Ca 10.3; Ca/phos product 52.5  (goal < 55) - Renal:  SCr wnl, stable - NGT removed. Weight looks OK. Using some PRN Zofran, no emesis documented. Has IV Reglan scheduled TID.  - + BMs  - LFTs:  WNL except Alk Phos which is elevated - TGs:  Slightly elevated prior to starting TPN, but now WNL- 148 on 10/15 - Prealbumin: Low on admission but trended up and remained at or near goal during Aug to Sep; now decreased but trending back to goal (15.6 on 10/15).   - Drinking  Boost, Ensure.  Dietician recommended ordering Magic Cup BID with meals. Ate chicken wings brought in by family on 10/18.  RN documented poor appetitie NUTRITIONAL GOALS                                                                                             RD recs (10/8):  137-153 gms Protein (1.7 - 1.9 g/kg) 2030 - 2270 Kcal (25-28 kcal/kg)  Ensure Enlive once/day provides 350 kcal and 20 grams of protein. Boost Breeze once/day provides 250 kcal and 9 grams of protein. Magic Cup BID with meals, each supplement provides 290 kcal and 9 grams of protein  Recalculation (10/3): Clinimix 5/15 3000 mL over 16h cycle with lipids 3x/week on MWF only would provide: 150 g protein and avg 2335 kcal/day  Glucose infusion rate will be 6.5 mg/kg/min at max cyclic rate. (Maximum 7 mg/kg/min)  PLAN                                                                                                                         Now:  KCl 10 mEq IV x 6  Mag 4g IV x3  At 1800 today:  Continue Clinimix 5/15 (electrolyte free) 3000 mL/day cycled over 16 hr  50 ml/hr x 1 hr (6p-7p), 207 ml/hr x 12 hr (7p-9a), 50 ml/hr x 1 hr (9a-10a), then off  Continue sodium in TPN as patient has been receiving since Na stable: add 70 mEq NaCl and 55 mEq Na Acetate in the 3L TPN bag  Lipids at 20 mls/hr x 12 hours 3 times weekly on MWF  Standard MVI, Trace elements, and pepcid 40 mg/day in TPN  Pepcid and IV protonix for coffee  ground material from NGT in early August.  In October had some blood in NGT  TPN lab panels on Mondays & Thursdays.   BMET, Mag & Phos daily ordered.  Follow up patient's oral intake. Will continue TPN until enteral intake is meeting >60% nutrition goals.  Netta Cedars, PharmD, BCPS Pager: [phone] 08/16/2017 @ 9:40 AM

## 2017-08-16 NOTE — Progress Notes (Signed)
Arrived to pt room for LE venous duplex. Patient is refusing at this time stating he knows his pain is from "sutures sticking him."    Can refer also to negative LE venous duplex on 08/11/17.   Please reorder if pt is agreeable and test is still indicated.   Landry Mellow, RDMS, RVT 08/16/2017

## 2017-08-16 NOTE — Progress Notes (Signed)
Educated pt on and need and purpose of mobilization. He reports feeling discomfort surrounding midline abdominal incision and associated sutures with bearing weight. Discussed pain management options and encouraged patient to get up to chair; pt declined at this time. Advised patient that the plan of care included getting up to the chair twice today. Will continue to encourage pt to mobilize and attempt to get pt up to the chair at 1000

## 2017-08-16 NOTE — Progress Notes (Signed)
During AM dressing change, an larger area of abdominal mesh was visible within R abd wound. MD made aware; No new orders at this time. Will continue to monitor patient closely and change dressings as ordered.

## 2017-08-16 NOTE — Progress Notes (Signed)
[facility] Office:  [phone] General Surgery Progress Note   LOS: 102 days  POD -  16 Days Post-Op  Chief Complaint: abdominal pain  Assessment and Plan: 1.   Laparoscopy, laparotomy, extensive enterolysis with removal of mesh (partially) in the right upper quadrant; resection of old anastomosis and ileotransverse anastomosis - 07/31/2017 - [facility] Done  Complex hospital course  Didn't get out of bed yesterday, because lower retention sutures "cutting" patient.  I removed the lowest 2 retention sutures.  The wound otherwise looks good.  2.  OPEN RIGHT ADRENALECTOMY WITH SMALL BOWEL RESECTION AND ANASTOMOSIS - 05/06/2017 - [name]             Additional surgery - 05/13/2017, 05/14/2017, 05/17/2017 and 05/20/2017       3.  Malnutrition             On TPN              Prealbumin - 08/10/2017 - 15.6 4. HTN 5. COPD/Smokes             Bronchoscopy by Dr. Despina Hick - 05/22/2017 5.  Anemia             Hgb - 9.1 -08/10/2017 6. On chronic pain meds Prior to admission - he took about 40 mg oxycodone per day for neck - goes to pain clinic - sees Dr. [name] for neck 7. 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 Active Problems:   GERD   Tobacco abuse   COPD (chronic obstructive pulmonary disease) (HCC)   Anemia, iron deficiency   Essential hypertension   Hyperthyroidism   Intra-abdominal adhesions s/p SB resection 05/06/2017   Chronic narcotic use   Generalized anxiety disorder   Hypokalemia   Anastomotic leak of intestine   Wound dehiscence, surgical   Aspiration pneumonia (HCC)   Ileus (HCC)   Pressure injury of skin   Protein-calorie malnutrition, severe (HCC)   Pheochromocytoma, right, 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   Hypomagnesemia   Hypothyroidism   Anxiety state  Subjective:  Didn't get out of bed yesterday, because lower retention sutures "cutting" patient.  Wife also worried  about left leg pain - and worried about DVT.  But the patient denies left leg pain today.  Objective:   Vitals:   08/16/17 0400 08/16/17 0600  BP: 102/71 101/63  Pulse: 96   Resp: 14 15  Temp: 98.6 F (37 C)   SpO2: 97%      Intake/Output from previous day:  10/20 0701 - 10/21 0700 In: 2921.2 [I.V.:2371.2; IV Piggyback:550] Out: 4854 [OEVOJ:5009]  Intake/Output this shift:  No intake/output data recorded.   Physical Exam:   General: AA M who is alert and oriented.    HEENT: Normal. Pupils equal. .   Lungs: Clear.  Inc Spi - 1,200 cc   Abdomen: Soft.  Has BS.   Wound: RUQ incision - relatively clean, some mesh exposed laterally.  Midline wound with retention sutures.  I removed the lowest two retention sutures, because they are limiting his mobility.   Lab Results:   No results for input(s): WBC, HGB, HCT, PLT in the last 72 hours.  BMET    Recent Labs  08/15/17 0515 08/16/17 0526  NA 136 137  K 4.0 3.5  CL 107 105  CO2 23 23  GLUCOSE 144* 102*  BUN 21* 21*  CREATININE 0.66 0.53*  CALCIUM 9.0 9.1    PT/INR  No  results for input(s): LABPROT, INR in the last 72 hours.  ABG  No results for input(s): PHART, HCO3 in the last 72 hours.  Invalid input(s): PCO2, PO2   Studies/Results:  No results found.   Anti-infectives:   Anti-infectives    Start     Dose/Rate Route Frequency Ordered Stop   08/11/17 1400  fluconazole (DIFLUCAN) IVPB 100 mg     100 mg 50 mL/hr over 60 Minutes Intravenous Every 24 hours 08/11/17 1312     08/01/17 0600  metroNIDAZOLE (FLAGYL) IVPB 500 mg  Status:  Discontinued     500 mg 100 mL/hr over 60 Minutes Intravenous On call to O.R. 07/31/17 1656 07/31/17 1709   08/01/17 0600  ciprofloxacin (CIPRO) IVPB 400 mg  Status:  Discontinued     400 mg 200 mL/hr over 60 Minutes Intravenous On call to O.R. 07/31/17 1656 07/31/17 1709   07/31/17 0556  metroNIDAZOLE (FLAGYL) IVPB 500 mg     500 mg 100 mL/hr over 60 Minutes Intravenous On call  to O.R. 07/31/17 0556 07/31/17 0840   07/31/17 0556  ciprofloxacin (CIPRO) IVPB 400 mg     400 mg 200 mL/hr over 60 Minutes Intravenous On call to O.R. 07/31/17 0556 07/31/17 0914   07/08/17 1600  fluconazole (DIFLUCAN) IVPB 100 mg     100 mg 50 mL/hr over 60 Minutes Intravenous Every 24 hours 07/08/17 0828 07/14/17 1710   07/01/17 1500  fluconazole (DIFLUCAN) IVPB 100 mg     100 mg 50 mL/hr over 60 Minutes Intravenous Every 24 hours 07/01/17 1401 07/07/17 1715   06/16/17 1200  piperacillin-tazobactam (ZOSYN) IVPB 3.375 g     3.375 g 12.5 mL/hr over 240 Minutes Intravenous Every 8 hours 06/16/17 1026 06/17/17 0004   06/08/17 1200  piperacillin-tazobactam (ZOSYN) IVPB 3.375 g  Status:  Discontinued     3.375 g 12.5 mL/hr over 240 Minutes Intravenous Every 8 hours 06/08/17 1056 06/16/17 1026   05/29/17 2200  ceFEPIme (MAXIPIME) 2 g in dextrose 5 % 50 mL IVPB  Status:  Discontinued     2 g 100 mL/hr over 30 Minutes Intravenous Every 8 hours 05/29/17 2031 06/03/17 0832   05/29/17 2100  metroNIDAZOLE (FLAGYL) IVPB 500 mg  Status:  Discontinued     500 mg 100 mL/hr over 60 Minutes Intravenous Every 8 hours 05/29/17 2029 06/03/17 0832   05/26/17 2200  ceFAZolin (ANCEF) IVPB 1 g/50 mL premix  Status:  Discontinued     1 g 100 mL/hr over 30 Minutes Intravenous Every 8 hours 05/26/17 1008 05/29/17 2023   05/25/17 1100  vancomycin (VANCOCIN) 1,250 mg in sodium chloride 0.9 % 250 mL IVPB  Status:  Discontinued     1,250 mg 166.7 mL/hr over 90 Minutes Intravenous Every 12 hours 05/25/17 0955 05/26/17 0946   05/22/17 1400  anidulafungin (ERAXIS) 100 mg in sodium chloride 0.9 % 100 mL IVPB     100 mg 78 mL/hr over 100 Minutes Intravenous Every 24 hours 05/21/17 1330 05/27/17 1740   05/21/17 1400  anidulafungin (ERAXIS) 200 mg in sodium chloride 0.9 % 200 mL IVPB     200 mg 78 mL/hr over 200 Minutes Intravenous  Once 05/21/17 1330 05/21/17 1940   05/15/17 1000  anidulafungin (ERAXIS) 100 mg in  sodium chloride 0.9 % 100 mL IVPB  Status:  Discontinued     100 mg 78 mL/hr over 100 Minutes Intravenous Every 24 hours 05/14/17 0829 05/16/17 0901   05/15/17 1000  metroNIDAZOLE (FLAGYL) IVPB  500 mg  Status:  Discontinued     500 mg 100 mL/hr over 60 Minutes Intravenous Every 8 hours 05/15/17 0903 05/25/17 0938   05/14/17 0900  anidulafungin (ERAXIS) 200 mg in sodium chloride 0.9 % 200 mL IVPB     200 mg 78 mL/hr over 200 Minutes Intravenous  Once 05/14/17 0829 05/14/17 1325   05/13/17 1927  vancomycin (VANCOCIN) 1-5 GM/200ML-% IVPB    Comments:  Ward, Christa   : cabinet override      05/13/17 1927 05/14/17 0744   05/12/17 1400  ceFEPIme (MAXIPIME) 1 g in dextrose 5 % 50 mL IVPB     1 g 100 mL/hr over 30 Minutes Intravenous Every 8 hours 05/12/17 0939 05/26/17 2359   05/12/17 0900  vancomycin (VANCOCIN) IVPB 1000 mg/200 mL premix  Status:  Discontinued     1,000 mg 200 mL/hr over 60 Minutes Intravenous Every 12 hours 05/12/17 0750 05/16/17 0852   05/12/17 0830  aztreonam (AZACTAM) 2 GM IVPB     2 g 100 mL/hr over 30 Minutes Intravenous  Once 05/12/17 0800 05/12/17 0957   05/06/17 0916  vancomycin (VANCOCIN) IVPB 1000 mg/200 mL premix     1,000 mg 200 mL/hr over 60 Minutes Intravenous On call to O.R. 05/06/17 0916 05/06/17 1229      Alphonsa Overall, MD, FACS Pager: [facility] Office: [phone] 08/16/2017

## 2017-08-17 LAB — CBC
HEMATOCRIT: 25.5 % — AB (ref 39.0–52.0)
Hemoglobin: 8.2 g/dL — ABNORMAL LOW (ref 13.0–17.0)
MCH: 24.6 pg — AB (ref 26.0–34.0)
MCHC: 32.2 g/dL (ref 30.0–36.0)
MCV: 76.6 fL — AB (ref 78.0–100.0)
PLATELETS: 749 10*3/uL — AB (ref 150–400)
RBC: 3.33 MIL/uL — ABNORMAL LOW (ref 4.22–5.81)
RDW: 18.6 % — AB (ref 11.5–15.5)
WBC: 7.8 10*3/uL (ref 4.0–10.5)

## 2017-08-17 LAB — COMPREHENSIVE METABOLIC PANEL
ALBUMIN: 2.3 g/dL — AB (ref 3.5–5.0)
ALT: 20 U/L (ref 17–63)
AST: 17 U/L (ref 15–41)
Alkaline Phosphatase: 138 U/L — ABNORMAL HIGH (ref 38–126)
Anion gap: 6 (ref 5–15)
BUN: 18 mg/dL (ref 6–20)
CHLORIDE: 106 mmol/L (ref 101–111)
CO2: 24 mmol/L (ref 22–32)
CREATININE: 0.53 mg/dL — AB (ref 0.61–1.24)
Calcium: 8.8 mg/dL — ABNORMAL LOW (ref 8.9–10.3)
GFR calc Af Amer: >60 mL/min (ref >60)
GLUCOSE: 123 mg/dL — AB (ref 65–99)
POTASSIUM: 3.6 mmol/L (ref 3.5–5.1)
SODIUM: 136 mmol/L (ref 135–145)
Total Bilirubin: 0.5 mg/dL (ref 0.3–1.2)
Total Protein: 6.3 g/dL — ABNORMAL LOW (ref 6.5–8.1)

## 2017-08-17 LAB — TRIGLYCERIDES: Triglycerides: 141 mg/dL (ref <150)

## 2017-08-17 LAB — DIFFERENTIAL
BASOS PCT: 1 %
Basophils Absolute: 0 10*3/uL (ref 0.0–0.1)
EOS ABS: 0.6 10*3/uL (ref 0.0–0.7)
EOS PCT: 7 %
Lymphocytes Relative: 33 %
Lymphs Abs: 2.6 10*3/uL (ref 0.7–4.0)
MONOS PCT: 9 %
Monocytes Absolute: 0.7 10*3/uL (ref 0.1–1.0)
NEUTROS ABS: 4 10*3/uL (ref 1.7–7.7)
Neutrophils Relative %: 50 %

## 2017-08-17 LAB — PREALBUMIN: Prealbumin: 13.3 mg/dL — ABNORMAL LOW (ref 18–38)

## 2017-08-17 LAB — PHOSPHORUS: PHOSPHORUS: 4 mg/dL (ref 2.5–4.6)

## 2017-08-17 LAB — MAGNESIUM: Magnesium: 1.6 mg/dL — ABNORMAL LOW (ref 1.7–2.4)

## 2017-08-17 MED ORDER — MAGNESIUM SULFATE 4 GM/100ML IV SOLN
4.0000 g | INTRAVENOUS | Status: AC
  Administered 2017-08-17 (×3): 4 g via INTRAVENOUS
  Filled 2017-08-17 (×3): qty 100

## 2017-08-17 MED ORDER — FAT EMULSION 20 % IV EMUL
180.0000 mL | INTRAVENOUS | Status: AC
  Administered 2017-08-17: 180 mL via INTRAVENOUS
  Filled 2017-08-17: qty 250

## 2017-08-17 MED ORDER — TRACE MINERALS CR-CU-MN-SE-ZN 10-1000-500-60 MCG/ML IV SOLN
INTRAVENOUS | Status: AC
  Administered 2017-08-17: 19:00:00 via INTRAVENOUS
  Filled 2017-08-17 (×2): qty 1990

## 2017-08-17 MED ORDER — POTASSIUM CHLORIDE 10 MEQ/50ML IV SOLN
10.0000 meq | INTRAVENOUS | Status: AC
  Administered 2017-08-17 (×6): 10 meq via INTRAVENOUS
  Filled 2017-08-17 (×6): qty 50

## 2017-08-17 NOTE — Evaluation (Addendum)
Physical Therapy Evaluation Patient Details Name: [name] [name] MRN: [id number] DOB: 1961-06-12 Today's Date: 08/17/2017   History of Present Illness   56 y.o. male admitted 7/11 with right adrenal mass , s/p  right adrenalectomy complicated by small bowel injury requiring resection with anastomosis . Has required multiple surgeris, the last being 10/5 for  Laparoscopy, laparotomy, extensive enterolysis with removal of mesh (partially) in the right upper quadrant; resection of old anastomosis and ileotransverse anastomosiss  Clinical Impression  The patient is able to ambulate today x 440' with RW. Pt admitted with above diagnosis. Pt currently with functional limitations due to the deficits listed below (see PT Problem List).Pt will benefit from skilled PT to increase their independence and safety with mobility to allow discharge to the venue listed below.       Follow Up Recommendations HHPT   Equipment Recommendations  Rolling walker with 5" wheels    Recommendations for Other Services       Precautions / Restrictions Precautions Precautions: Fall Precaution Comments: abdominal wounds      Mobility  Bed Mobility Overal bed mobility: Needs Assistance Bed Mobility: Sidelying to Sit;Sit to Sidelying Rolling: Supervision Sidelying to sit: Mod assist   Sit to supine: Mod assist Sit to sidelying: Mod assist General bed mobility comments: cues for rolling , for sidelying to protect abdominal surgeries. Assit with trunk once on the side. Then verbal cues to go to sidelying. While assisting legs onto bed, the patient lay back to his back, increasing the complaints of pain. PCA not availble  yet. Repositioned  in bed.  Transfers Overall transfer level: Needs assistance Equipment used: Rolling walker (2 wheeled) Transfers: Sit to/from [facility] Sit to Stand: Min guard            Ambulation/Gait Ambulation/Gait assistance: Min guard Ambulation Distance  (Feet): 440 Feet Assistive device: Rolling walker (2 wheeled) Gait Pattern/deviations: Step-through pattern;Decreased step length - right;Decreased step length - left;Shuffle;Trunk flexed;Narrow base of support Gait velocity: slow   General Gait Details: slow gait speed. HR 118, Sats >95% RA.  Complained of feeling dizzy after sitting down. BP 128/92.   Stairs            Wheelchair Mobility    Modified Rankin (Stroke Patients Only)       Balance Overall balance assessment: Needs assistance Sitting-balance support: Feet supported Sitting balance-Leahy Scale: Good     Standing balance support: During functional activity;Bilateral upper extremity supported Standing balance-Leahy Scale: Fair                               Pertinent Vitals/Pain Pain Score: 4  Pain Intervention(s): Monitored during session;PCA encouraged;Premedicated before session    [place] expects to be discharged to:: Private residence Living Arrangements: Spouse/significant other Available Help at Discharge: Family;Available 24 hours/day Type of Home: House Home Access: Stairs to enter Entrance Stairs-Rails: Right Entrance Stairs-Number of Steps: 5 Home Layout: Two level;Able to live on main level with bedroom/bathroom Home Equipment: Shower seat;Grab bars - toilet      Prior Function Level of Independence: Independent               Hand Dominance        Extremity/Trunk Assessment   Upper Extremity Assessment Upper Extremity Assessment: Generalized weakness    Lower Extremity Assessment Lower Extremity Assessment: Generalized weakness    Cervical / Trunk Assessment Cervical / Trunk Assessment: Normal  Communication   Communication: No difficulties  Cognition Arousal/Alertness: Awake/alert Behavior During Therapy: WFL for tasks assessed/performed Overall Cognitive Status: Within Functional Limits for tasks assessed                                         General Comments      Exercises     Assessment/Plan    PT Assessment Patient needs continued PT services  PT Problem List Decreased strength;Decreased activity tolerance;Pain       PT Treatment Interventions DME instruction;Gait training;Functional mobility training;Therapeutic activities;Patient/family education    PT Goals (Current goals can be found in the Care Plan section)  Acute Rehab PT Goals Patient Stated Goal: to walk around the block PT Goal Formulation: With patient Time For Goal Achievement: 08/31/17 Potential to Achieve Goals: Good    Frequency Min 3X/week   Barriers to discharge        Co-evaluation               AM-PAC PT "6 Clicks" Daily Activity  Outcome Measure Difficulty turning over in bed (including adjusting bedclothes, sheets and blankets)?: A Lot Difficulty moving from lying on back to sitting on the side of the bed? : A Lot Difficulty sitting down on and standing up from a chair with arms (e.g., wheelchair, bedside commode, etc,.)?: A Little Help needed moving to and from a bed to chair (including a wheelchair)?: A Little Help needed walking in hospital room?: A Little Help needed climbing 3-5 steps with a railing? : A Lot 6 Click Score: 15    End of Session   Activity Tolerance: Patient tolerated treatment well Patient left: with call bell/phone within reach;with bed alarm set Nurse Communication: Mobility status PT Visit Diagnosis: Difficulty in walking, not elsewhere classified (R26.2);Muscle weakness (generalized) (M62.81)    Time: 6720-9470 PT Time Calculation (min) (ACUTE ONLY): 30 min   Charges:   PT Evaluation $PT Eval re-evaluation PT Treatments $Gait Training: 8-22 mins   PT G CodesTresa [name] PT [phone]   [name] [name] 08/17/2017, 12:47 PM

## 2017-08-17 NOTE — Progress Notes (Signed)
[name] NOTE   Pharmacy Consult for TPN Indication: prolonged ileus, small bowel leak, multiple bowel surgeries  Patient Measurements: Body mass index is 25.43 kg/m. Filed Weights   08/15/17 0405 08/16/17 0400 08/17/17 0448  Weight: 176 lb 5.9 oz (80 kg) 178 lb 9.2 oz (81 kg) 177 lb 4 oz (80.4 kg)   HPI: 4 yoM admitted on 7/11 for enlarging adrenal mass and planned adrenalectomy.  Pharmacy consulted to dose TPN.  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   7/22 OR:  wash out, tube ileostomy, left with open abdomen & wound vac in place.  Plan for OR on 7/25 for closure 7/25 OR: mesh placed for abdominal closure, wound vac 8/2 extubated, propofol drip off 8/4 TPN off for ~ 5 hours due to line detached from filter 8/10 MD request decrease rate x 1 week 2nd low sodium 8/12 Additional sodium added to TPN due to persistent hyponatremia 8/16 Increasing rate back to goal since sodium has improved with above intervention.   8/20 Prealbumin decreased.  New apparent enteric leak per surgery notes. 8/21 CaPhos product elevated, remove electrolytes from TPN 8/25 PICC did not have blood return in 2 of the 3 ports.  Alteplase intra-catheter and function returned.  TPN infusing correctly on 8/26 per RN. 8/28: d/c SSI 8/30: added electrolytes to TPN 8/31: TPN IV line came out around ~0340 and current bag was stopped at this time.  Restart new clinimix 5/15 1L bag (no lytes since phos is elevated this morning) until next bag is due at [facility]. Will not add MVI and trace elements to this 1L bag. Use  electrolyte free clinimix with new bag at [facility] 9/1 NGT out 9/3 added electrolytes to TPN 9/4 lytes out of TPN 9/7 cycle over 18 hrs 9/8 add back electrolytes to TPN.  Cycle over 14 hours 9/9 lytes out of TPN, continue 14 hr cycle, DC SSI/CBGs 9/13 electrolytes added back to TPN 9/14 remove electrolytes from TPN 10/3 Adjusted estimated needs based on prealbumin (decreased on 9/24, low but slightly improved on 10/1) and attempt to maximize nutrition prior to surgery planned for 07/31/17 - required increase of cycle to 16 hours. 10/5 to OR for fistula-repair surgery 10/6: Lytes added back to TPN with low Phos 10/10 D/C CBGs 10/14 NGT clamp trial 10/16: CLD ordered.  Per RN notes patient tolerated liquids, ice chips, small amnt jello 10/17: NGT removed 10/19: Diet advanced to Regular diet  10/21: Pharmacy received consult to wean TPN off.  Clarified with Dr. Rosendo Gros to gradually decrease TPN over next 2-days with plans to stop by mid-week  Insulin requirements past 24 hours: none  Current Nutrition: regular diet ordered to start 10/19, Boost, Ensure & Magic Cup daily  IVF: none  Central access: PICC line ordered 7/19, placed on 7/20 TPN start date: 7/20  ASSESSMENT  Today, 08/17/17  - Glucose:  (goal 100-150) At goal.  SSI/CBGs discontinued 10/10. - Electrolytes:  K 3.5  - daily IV replacement  Mag 1.6  - daily IV replacement  Phos 4.0- has been variable, lytes removed from TPN to avoid high Phos levels  Na WNL- have been providing ~42 mEq/L Na in TPN  Corr Ca 10.3; Ca/phos product 52.5  (goal < 55) - Renal:  SCr wnl, stable - NGT removed. Weight looks OK. Using some PRN Zofran, no emesis documented. Has IV Reglan scheduled TID.  - + BMs  - LFTs:  WNL except Alk Phos which is elevated but trending down - TGs:  Slightly elevated prior to starting TPN, but now WNL- 148 on  10/15 - Prealbumin: Low on admission but trended up and remained at or near goal during Aug to Sep;   (15.6 on 10/15 to 13.3 10/22).   - Drinking Boost, Ensure.  Dietician recommended ordering Magic Cup BID with meals. Ate chicken wings brought in by family on 10/18.  RN documented poor appetitie NUTRITIONAL GOALS                                                                                             RD recs (10/8):  137-153 gms Protein (1.7 - 1.9 g/kg) 2030 - 2270 Kcal (25-28 kcal/kg)  Ensure Enlive once/day provides 350 kcal and 20 grams of protein. Boost Breeze once/day provides 250 kcal and 9 grams of protein. Magic Cup BID with meals, each supplement provides 290 kcal and 9 grams of protein  Recalculation (10/3): Clinimix 5/15 3000 mL over 16h cycle with lipids 3x/week on MWF only would provide: 150 g protein and avg 2335 kcal/day  Glucose infusion rate will be 6.5 mg/kg/min at max cyclic rate. (Maximum 7 mg/kg/min)  PLAN                                                                                                                         Now:  KCl 10 mEq IV x 6  Mag 4g IV x3  At 1800 today: NOTE Clinimix and lipid rate adjustment for orders to begin weaning TPN  Decrease Clinimix 5/15 (electrolyte free) to 2000 mL/day cycled over 16 hr.  TPN will provide 108.5gm protein (~78% lower end goal) and avg 1694 kcals (83% lower end goal)  50 ml/hr x 1 hr (6p-7p), 135 ml/hr x 14 hr (7p-9a), 50 ml/hr x 1 hr (9a-10a), then off  Continue sodium in TPN as patient has been receiving since Na stable: adding 50 mEq NaCl and 35 mEq Na Acetate in the 2L TPN  bag  Lipids at 15 mls/hr x 12 hours 3 times weekly on MWF  Standard MVI, Trace elements, and pepcid 40 mg/day in TPN  Pepcid and IV protonix for coffee ground material from NGT in early August.  In October had some blood in NGT  TPN lab panels on Mondays & Thursdays.   BMET, Mag & Phos daily ordered.  Follow up patient's oral  intake. Will continue TPN until enteral intake is meeting >60% nutrition goals.  Doreene Eland, PharmD, BCPS.   Pager: [phone] 08/17/2017 8:30 AM

## 2017-08-17 NOTE — Progress Notes (Signed)
Nutrition Follow-up  DOCUMENTATION CODES:   Not applicable  INTERVENTION:  - Continue TPN per Pharmacy. - Continue Boost Breeze and Ensure Enlive each once/day. - Continue to encourage PO intakes of meals and supplements.   NUTRITION DIAGNOSIS:   Inadequate oral intake related to poor appetite as evidenced by per patient/family report, meal completion < 25%. -ongoing  GOAL:   Patient will meet greater than or equal to 90% of their needs -met with TPN and minimal oral intakes.   MONITOR:   PO intake, Supplement acceptance, Weight trends, Labs, Skin, I & O's, Other (Comment) (TPN regimen)  REASON FOR ASSESSMENT:   Consult Calorie Count  ASSESSMENT:   56 y.o. 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 On 7/18, he developed a leak therefore was taken back to OR for re-do of ex-lap and repair of leak.  7/11 OR:right adrenalectomy with complication of intestinal injury and small bowel resection with anastomosis  7/18 OR:repair of small bowel anastomotic leak, abdominal closure of wound dehisence 7/19 OR: reopening of recent laparotomy, lysis of adhesions, noted ischemic perforation of new loop of intestine, intact repair of previous anastomotic leak repair, intact previous anastomosis.  7/22 OR: wash out, tube ileostomy, left with open abdomen &wound vac in place. Plan for OR on 7/25 for closure 7/25 OR: mesh placed for abdominal closure, wound vac 8/2extubated, propofol drip off 8/4TPN off for ~ 5 hours due to line detached from filter. Ultrasound-guided aspiration By interventional radiology of small perihepatic fluid collection yielded only 1 mL of the fluid, sent for culture. NPO - mild coffee ground on NG but no active bleed 8/6patient improving. Off Precedex drip since 8/4. Dilaudid  Drip being changed over to PCA. Still with some coffee ground-looking material in NG tube.  8/10 hyponatremia and plan to decrease TPN from 3L/day to 2L/day x1 week 8/13found to have fecal matter in wound vac (EC fistula) 8/14increase in fecal matter presence in wound vac with no plan for surgery at this time 8/16TPN advanced to goal rate of 115 ml/hr with 20% ILE @ 20 mL/hr x12 hours on MWF 8/22: NGT clamped. RD to order Boost Breeze for patient to sip on; Mycostatin started for oral thrush. 9/1:pulled NGT and no plans for replacement. 9/1:developed N/V and only permitted sips of clears, if tolerated. 9/4:NGT re-insertion attempted several times without success 9/7:begin cycling TPN 9/23: allowed to leave the floor to go outside 10/5:Laparoscopy, laparotomy, extensive enterolysis with removal of mesh (partially) in the right upper quadrant; resection of old anastomosis and ileotransverse anastomosis 10/5:NGT placement during surgery 10/15: development of recurrent oral thrush 10/17: NGT removed at 10:45 AM 10/22: decrease cyclic TPN volume from 3L/day to 2L/day 10/22-10/24: Calorie Count   10/22 Diet advanced from Soft to Regular on 10/19 at 11:35 AM. No documented intakes since previous assessment. Calorie Count ordered and starts to day and will end on 10/24. Pt reports that he ate a few bites each of bacon and scrambled eggs and he feels that he is taking more bites now than he was when RD saw him at the end of last week. He initially states abdominal pain after intakes but after more questioning it comes to light that he is not experiencing pain, but, rather, nausea after eating. He is open to trying antinausea medication before a meal to assess if this   helps, but he does not want it to be something he has to do before every meal.  He usually eats bites at breakfast and dinner and has been skipping lunch. He has been sipping on oral nutrition supplements, when he does accept  them. Weight has mainly been stable. Spoke with Pharmacist who reports he received an order to wean TPN so he will decrease cyclic TPN from 3L/day to 2L/day starting today. Clinimix 5/15 x2L/day with 20% ILE @ 20 mL/hr x12 on MWF will provide a daily average of 100 grams of protein (73% minimum estimated protein need) and 1626 kcal (80% minimum estimated kcal need).  Medications reviewed; 4 g IV Mg sulfate x3 runs yesterday and x3 runs today, daily oral multivitamin with minerals started yesterday, 40 mg oral Protonix/day, 10 mEq IV KCl x6 runs yesterday and x6 runs today, 250 mg Florastor BID. Labs reviewed; glucose: 123 mg/dL, creatinine: 0.53 mg/dL, Ca: 8.8 mg/dL, Mg: 1.6 mg/dL, Alk Phos elevated, triglycerides WDL (141 mg/dL).     10/18 - Pt is POD #13.  - Diet advanced from NPO to CLD on 10/16 at 8:15 AM and then to Soft today at 8:05 AM. - Pt reports eating a few bites each of grits and scrambled egg and denies associated abdominal pain or nausea.  - He reports some abdominal pressure with he attributes to his need to go to the bathroom.  - Plan to continue cyclic TPN regimen.  - Weight has fluctuated over the past 1 week but primarily consistent with weight from prior to surgery on 10/5.  4 g IV Mg sulfate x3 runs today, 10 mEq IV KCl x4 runs today. Mg: 1.4 mg/dL    10/11 - Pt remains NPO since prior to surgery.  - NGT remains in place with very minimal to no output over the past few days.  - Current weight now consistent with weight, which was stable, for several weeks prior to surgery on 10/5.  - Triple lumen in place and cyclic TPN (as outlined on 10/8) continues.  - No plans at this time for NGT removal or for diet advancement.    Diet Order:  Diet regular Room service appropriate? Yes; Fluid consistency: Thin TPN (CLINIMIX) Adult without lytes  Skin:  Wound (see comment) (Incisions to R flank and abdomen from 7/11 and 7/18)  Last BM:  10/21  Height:   Ht Readings  from Last 1 Encounters:  05/26/17 5' 10" (1.778 m)    Weight:   Wt Readings from Last 1 Encounters:  08/17/17 177 lb 4 oz (80.4 kg)    Ideal Body Weight:  75.45 kg  BMI:  Body mass index is 25.43 kg/m.  Estimated Nutritional Needs:   Kcal:  2030-2270 (25-28 kcal/kg)  Protein:  137-153 grams (1.7-1.9 grams/kg)  Fluid:  2 L/day  EDUCATION NEEDS:   No education needs identified at this time      , MS, RD, LDN, CNSC Inpatient Clinical Dietitian Pager # [phone] After hours/weekend pager # [phone]  

## 2017-08-17 NOTE — Progress Notes (Signed)
17 Days Post-Op   Subjective/Chief Complaint: Pt with no issues overnight tol Po Mobilizing after sutures removed   Objective: Vital signs in last 24 hours: Temp:  [97.7 F (36.5 C)-98.8 F (37.1 C)] 97.9 F (36.6 C) (10/22 0308) Pulse Rate:  [87-108] 97 (10/22 0700) Resp:  [13-21] 19 (10/22 0733) BP: (104-133)/(64-101) 117/65 (10/22 0600) SpO2:  [96 %-99 %] 98 % (10/22 0733) Weight:  [80.4 kg (177 lb 4 oz)] 80.4 kg (177 lb 4 oz) (10/22 0448) Last BM Date: 08/16/17  Intake/Output from previous day: 10/21 0701 - 10/22 0700 In: 608 [IV Piggyback:608] Out: 850 [Urine:850] Intake/Output this shift: Total I/O In: 690.8 [I.V.:690.8] Out: -   General appearance: alert and cooperative GI: soft, ND, NTTP, incisions c/d/i good granulation tissue  Lab Results:   Recent Labs  08/17/17 0450  WBC 7.8  HGB 8.2*  HCT 25.5*  PLT 749*   BMET  Recent Labs  08/16/17 0526 08/17/17 0450  NA 137 136  K 3.5 3.6  CL 105 106  CO2 23 24  GLUCOSE 102* 123*  BUN 21* 18  CREATININE 0.53* 0.53*  CALCIUM 9.1 8.8*   PT/INR No results for input(s): LABPROT, INR in the last 72 hours. ABG No results for input(s): PHART, HCO3 in the last 72 hours.  Invalid input(s): PCO2, PO2  Studies/Results: No results found.  Anti-infectives: Anti-infectives    Start     Dose/Rate Route Frequency Ordered Stop   08/11/17 1400  fluconazole (DIFLUCAN) IVPB 100 mg  Status:  Discontinued     100 mg 50 mL/hr over 60 Minutes Intravenous Every 24 hours 08/11/17 1312 08/16/17 1847   08/01/17 0600  metroNIDAZOLE (FLAGYL) IVPB 500 mg  Status:  Discontinued     500 mg 100 mL/hr over 60 Minutes Intravenous On call to O.R. 07/31/17 1656 07/31/17 1709   08/01/17 0600  ciprofloxacin (CIPRO) IVPB 400 mg  Status:  Discontinued     400 mg 200 mL/hr over 60 Minutes Intravenous On call to O.R. 07/31/17 1656 07/31/17 1709   07/31/17 0556  metroNIDAZOLE (FLAGYL) IVPB 500 mg     500 mg 100 mL/hr over 60  Minutes Intravenous On call to O.R. 07/31/17 0556 07/31/17 0840   07/31/17 0556  ciprofloxacin (CIPRO) IVPB 400 mg     400 mg 200 mL/hr over 60 Minutes Intravenous On call to O.R. 07/31/17 0556 07/31/17 0914   07/08/17 1600  fluconazole (DIFLUCAN) IVPB 100 mg     100 mg 50 mL/hr over 60 Minutes Intravenous Every 24 hours 07/08/17 0828 07/14/17 1710   07/01/17 1500  fluconazole (DIFLUCAN) IVPB 100 mg     100 mg 50 mL/hr over 60 Minutes Intravenous Every 24 hours 07/01/17 1401 07/07/17 1715   06/16/17 1200  piperacillin-tazobactam (ZOSYN) IVPB 3.375 g     3.375 g 12.5 mL/hr over 240 Minutes Intravenous Every 8 hours 06/16/17 1026 06/17/17 0004   06/08/17 1200  piperacillin-tazobactam (ZOSYN) IVPB 3.375 g  Status:  Discontinued     3.375 g 12.5 mL/hr over 240 Minutes Intravenous Every 8 hours 06/08/17 1056 06/16/17 1026   05/29/17 2200  ceFEPIme (MAXIPIME) 2 g in dextrose 5 % 50 mL IVPB  Status:  Discontinued     2 g 100 mL/hr over 30 Minutes Intravenous Every 8 hours 05/29/17 2031 06/03/17 0832   05/29/17 2100  metroNIDAZOLE (FLAGYL) IVPB 500 mg  Status:  Discontinued     500 mg 100 mL/hr over 60 Minutes Intravenous Every 8 hours 05/29/17  2029 06/03/17 0832   05/26/17 2200  ceFAZolin (ANCEF) IVPB 1 g/50 mL premix  Status:  Discontinued     1 g 100 mL/hr over 30 Minutes Intravenous Every 8 hours 05/26/17 1008 05/29/17 2023   05/25/17 1100  vancomycin (VANCOCIN) 1,250 mg in sodium chloride 0.9 % 250 mL IVPB  Status:  Discontinued     1,250 mg 166.7 mL/hr over 90 Minutes Intravenous Every 12 hours 05/25/17 0955 05/26/17 0946   05/22/17 1400  anidulafungin (ERAXIS) 100 mg in sodium chloride 0.9 % 100 mL IVPB     100 mg 78 mL/hr over 100 Minutes Intravenous Every 24 hours 05/21/17 1330 05/27/17 1740   05/21/17 1400  anidulafungin (ERAXIS) 200 mg in sodium chloride 0.9 % 200 mL IVPB     200 mg 78 mL/hr over 200 Minutes Intravenous  Once 05/21/17 1330 05/21/17 1940   05/15/17 1000   anidulafungin (ERAXIS) 100 mg in sodium chloride 0.9 % 100 mL IVPB  Status:  Discontinued     100 mg 78 mL/hr over 100 Minutes Intravenous Every 24 hours 05/14/17 0829 05/16/17 0901   05/15/17 1000  metroNIDAZOLE (FLAGYL) IVPB 500 mg  Status:  Discontinued     500 mg 100 mL/hr over 60 Minutes Intravenous Every 8 hours 05/15/17 0903 05/25/17 0938   05/14/17 0900  anidulafungin (ERAXIS) 200 mg in sodium chloride 0.9 % 200 mL IVPB     200 mg 78 mL/hr over 200 Minutes Intravenous  Once 05/14/17 0829 05/14/17 1325   05/13/17 1927  vancomycin (VANCOCIN) 1-5 GM/200ML-% IVPB    Comments:  Ward, Christa   : cabinet override      05/13/17 1927 05/14/17 0744   05/12/17 1400  ceFEPIme (MAXIPIME) 1 g in dextrose 5 % 50 mL IVPB     1 g 100 mL/hr over 30 Minutes Intravenous Every 8 hours 05/12/17 0939 05/26/17 2359   05/12/17 0900  vancomycin (VANCOCIN) IVPB 1000 mg/200 mL premix  Status:  Discontinued     1,000 mg 200 mL/hr over 60 Minutes Intravenous Every 12 hours 05/12/17 0750 05/16/17 0852   05/12/17 0830  aztreonam (AZACTAM) 2 GM IVPB     2 g 100 mL/hr over 30 Minutes Intravenous  Once 05/12/17 0800 05/12/17 0957   05/06/17 0916  vancomycin (VANCOCIN) IVPB 1000 mg/200 mL premix     1,000 mg 200 mL/hr over 60 Minutes Intravenous On call to O.R. 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  Laparoscopy, laparotomy, extensive enterolysis with removal of mesh (partially) in the right upper quadrant; resection of old anastomosis and ileotransverse anastomosis - 07/31/2017 - Hassell Done             Complex hospital course            Wound looks good.  Mobilized after lower retention sutures removed.Con't dressing changes  2. OPEN RIGHT ADRENALECTOMY WITH SMALL BOWEL RESECTION AND ANASTOMOSIS - 05/06/2017 - [name] Additional surgery - 05/13/2017, 05/14/2017, 05/17/2017 and 05/20/2017  3. Malnutrition On TPN, tol PO Prealbumin - 08/10/2017 - 15.6 4. HTN 5. COPD/Smokes Bronchoscopy by Dr. Despina Hick - 05/22/2017 5. Anemia Hgb - 9.1 -08/10/2017 6. On chronic pain meds Prior to admission - he took about 40 mg oxycodone per day for neck - goes to pain clinic - sees Dr. [name] for neck 7. DVT prophylaxis - Lovenox      LOS: 103 days    Rosario Jacks., [facility] 08/17/2017

## 2017-08-18 LAB — BASIC METABOLIC PANEL
Anion gap: 9 (ref 5–15)
BUN: 14 mg/dL (ref 6–20)
CALCIUM: 9.2 mg/dL (ref 8.9–10.3)
CHLORIDE: 106 mmol/L (ref 101–111)
CO2: 22 mmol/L (ref 22–32)
CREATININE: 0.51 mg/dL — AB (ref 0.61–1.24)
GFR calc Af Amer: >60 mL/min (ref >60)
GFR calc non Af Amer: >60 mL/min (ref >60)
GLUCOSE: 114 mg/dL — AB (ref 65–99)
Potassium: 3.6 mmol/L (ref 3.5–5.1)
Sodium: 137 mmol/L (ref 135–145)

## 2017-08-18 LAB — PHOSPHORUS: Phosphorus: 5.3 mg/dL — ABNORMAL HIGH (ref 2.5–4.6)

## 2017-08-18 LAB — MAGNESIUM: MAGNESIUM: 1.5 mg/dL — AB (ref 1.7–2.4)

## 2017-08-18 MED ORDER — POTASSIUM CHLORIDE 10 MEQ/50ML IV SOLN
10.0000 meq | INTRAVENOUS | Status: AC
  Administered 2017-08-18 (×6): 10 meq via INTRAVENOUS
  Filled 2017-08-18 (×6): qty 50

## 2017-08-18 MED ORDER — MAGNESIUM SULFATE 4 GM/100ML IV SOLN
4.0000 g | INTRAVENOUS | Status: AC
  Administered 2017-08-18 (×2): 4 g via INTRAVENOUS
  Filled 2017-08-18 (×3): qty 100

## 2017-08-18 MED ORDER — CHLORHEXIDINE GLUCONATE CLOTH 2 % EX PADS
6.0000 | MEDICATED_PAD | Freq: Every day | CUTANEOUS | Status: DC
  Administered 2017-08-18 – 2017-08-24 (×6): 6 via TOPICAL

## 2017-08-18 MED ORDER — MAGNESIUM SULFATE 4 GM/100ML IV SOLN
4.0000 g | Freq: Once | INTRAVENOUS | Status: AC
  Administered 2017-08-18: 4 g via INTRAVENOUS
  Filled 2017-08-18: qty 100

## 2017-08-18 MED ORDER — PANTOPRAZOLE SODIUM 40 MG IV SOLR
40.0000 mg | Freq: Every day | INTRAVENOUS | Status: DC
  Administered 2017-08-18 – 2017-08-19 (×2): 40 mg via INTRAVENOUS
  Filled 2017-08-18 (×2): qty 40

## 2017-08-18 MED ORDER — TRACE MINERALS CR-CU-MN-SE-ZN 10-1000-500-60 MCG/ML IV SOLN
INTRAVENOUS | Status: AC
  Administered 2017-08-18: 18:00:00 via INTRAVENOUS
  Filled 2017-08-18: qty 1990

## 2017-08-18 NOTE — Progress Notes (Signed)
[place] NOTE   Pharmacy Consult for TPN Indication: prolonged ileus, small bowel leak, multiple bowel surgeries  Patient Measurements: Body mass index is 25.43 kg/m. Filed Weights   08/15/17 0405 08/16/17 0400 08/17/17 0448  Weight: 176 lb 5.9 oz (80 kg) 178 lb 9.2 oz (81 kg) 177 lb 4 oz (80.4 kg)   HPI: 17 yoM admitted on 7/11 for enlarging adrenal mass and planned adrenalectomy.  Pharmacy consulted to dose TPN.  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   7/22 OR:  wash out, tube ileostomy, left with open abdomen & wound vac in place.  Plan for OR on 7/25 for closure 7/25 OR: mesh placed for abdominal closure, wound vac 8/2 extubated, propofol drip off 8/4 TPN off for ~ 5 hours due to line detached from filter 8/10 MD request decrease rate x 1 week 2nd low sodium 8/12 Additional sodium added to TPN due to persistent hyponatremia 8/16 Increasing rate back to goal since sodium has improved with above intervention.   8/20 Prealbumin decreased.  New apparent enteric leak per surgery notes. 8/21 CaPhos product elevated, remove electrolytes from TPN 8/25 PICC did not have blood return in 2 of the 3 ports.  Alteplase intra-catheter and function returned.  TPN infusing correctly on 8/26 per RN. 8/28: d/c SSI 8/30: added electrolytes to TPN 8/31: TPN IV line came out around ~0340 and current bag was stopped at this time.  Restart new clinimix 5/15 1L bag (no lytes since phos is elevated this morning) until next bag is due at [facility]. Will not add MVI and trace elements to this 1L bag. Use  electrolyte free clinimix with new bag at [facility] 9/1 NGT out 9/3 added electrolytes to TPN 9/4 lytes out of TPN 9/7 cycle over 18 hrs 9/8 add back electrolytes to TPN.  Cycle over 14 hours 9/9 lytes out of TPN, continue 14 hr cycle, DC SSI/CBGs 9/13 electrolytes added back to TPN 9/14 remove electrolytes from TPN 10/3 Adjusted estimated needs based on prealbumin (decreased on 9/24, low but slightly improved on 10/1) and attempt to maximize nutrition prior to surgery planned for 07/31/17 - required increase of cycle to 16 hours. 10/5 to OR for fistula-repair surgery 10/6: Lytes added back to TPN with low Phos 10/10 D/C CBGs 10/14 NGT clamp trial 10/16: CLD ordered.  Per RN notes patient tolerated liquids, ice chips, small amnt jello 10/17: NGT removed 10/19: Diet advanced to Regular diet  10/21: Pharmacy received consult to wean TPN off.   10/22: Clarified with Dr. Rosendo Gros to gradually decrease TPN over next 2-days with plans to stop by mid-week 10/23: Oral intake remains <= 25% of needs  Insulin requirements past 24 hours: none  Current Nutrition: regular diet ordered to start 10/19, Boost, Ensure & Magic Cup daily  IVF: none  Central access: PICC line ordered 7/19, placed on 7/20 TPN start date: 7/20  ASSESSMENT  Today, 08/18/17  - Glucose:  (goal 100-150) At goal.  SSI/CBGs discontinued 10/10. - Electrolytes:  K 3.6  - daily IV replacement  Mag 1.5  - daily IV replacement  Phos 5.3- has been variable, lytes removed from TPN to avoid high Phos levels  Na WNL- have been providing ~42 mEq/L Na in TPN  Corr Ca 10.6; Ca/phos product 56  (goal < 55) - Renal:  SCr wnl, stable - NGT removed. Weight looks OK. Using some PRN promethazine, less use of Zofran, no emesis documented. Has IV Reglan scheduled TID. - LFTs:  WNL except Alk Phos which is elevated but trending down -  TGs:  Slightly elevated prior to starting TPN, but now WNL- 141 on 10/22 - Prealbumin: Low on admission but trended up and remained at or near goal during Aug to Sep;   (15.6 on 10/15 to 13.3 10/22).   - Drinking Boost, Ensure.  Dietician recommended ordering Magic Cup BID with meals. Ate chicken wings brought in by family on 10/18.  RN documented poor appetitie NUTRITIONAL GOALS                                                                                             RD recs (10/8):  137-153 gms Protein (1.7 - 1.9 g/kg) 2030 - 2270 Kcal (25-28 kcal/kg)  Ensure Enlive once/day provides 350 kcal and 20 grams of protein. Boost Breeze once/day provides 250 kcal and 9 grams of protein. Magic Cup BID with meals, each supplement provides 290 kcal and 9 grams of protein  Recalculation (10/3): Clinimix 5/15 3000 mL over 16h cycle with lipids 3x/week on MWF only would provide: 150 g protein and avg 2335 kcal/day  Glucose infusion rate will be 6.5 mg/kg/min at max cyclic rate. (Maximum 7 mg/kg/min)  PLAN                                                                                                                         Now:  KCl 10 mEq IV x 6  Mag 4g IV x3  At 1800 today:  Based on PO intake, Continued reduced Clinimix 5/15 (electrolyte free) to 1990 mL/day cycled over 16 hr.  TPN will provide 108.5gm protein (~78% lower end goal) and avg 1694 kcals (83% lower end goal)  50 ml/hr x 1 hr (6p-7p), 135 ml/hr x 14 hr (7p-9a), 50 ml/hr x 1 hr (9a-10a), then off  Continue sodium in TPN as patient has been receiving since Na stable: adding 50 mEq NaCl and 35 mEq Na Acetate in the 2L TPN bag  Lipids at 15 mls/hr x 12  hours 3 times weekly on MWF  Standard MVI (also on multivitamin), Trace elements, and pepcid 40 mg/day in TPN  Pepcid and IV protonix for coffee ground material from NGT in early August.  In October had some blood in NGT  TPN lab panels on Mondays & Thursdays.   BMET, Mag & Phos  daily ordered.  Follow up patient's oral intake. Will continue TPN until enteral intake is meeting >60% nutrition goals.  Doreene Eland, PharmD, BCPS.   Pager: [phone] 08/18/2017 7:57 AM

## 2017-08-18 NOTE — Progress Notes (Signed)
18 Days Post-Op    CC: Adrenal mass/small bowel dehiscence  Subjective: He is upset today, and reports his knee gave out on him.  He wants a cortisone shot.  Not complaining too much of abdominal pain.  Still on fentanyl IV.  Nutrition reports p.o. intake is still very poor, less than 25%.  Continuing on TPN because of this.  Midline abdominal wound looks good.  The lateral right abdominal wound has some exudate dressings.  The mesh is still visible.  Objective: Vital signs in last 24 hours: Temp:  [98.4 F (36.9 C)-98.7 F (37.1 C)] 98.4 F (36.9 C) (10/23 0800) Pulse Rate:  [83-98] 83 (10/23 0934) Resp:  [15-21] 21 (10/23 0934) BP: (100-132)/(63-84) 130/84 (10/23 0934) SpO2:  [95 %-100 %] 100 % (10/23 0934) Last BM Date: 08/16/17 Nothing pO recorded 2900 IV 1600 urine, NO BM recorded Afebrile, VSS Labs are good Taking less than 25% of tray each meal   Intake/Output from previous day: 10/22 0701 - 10/23 0700 In: 2912.5 [I.V.:2412.5; IV Piggyback:500] Out: 1600 [Urine:1600] Intake/Output this shift: Total I/O In: 308.3 [I.V.:308.3] Out: 175 [Urine:175]  General appearance: alert, no distress and Anxious and up in chair. Resp: clear to auscultation bilaterally GI: Positive bowel sounds.  Midline abdominal wound has some good granulation tissue in it.,  Lateral abdominal wound with some exudate.  Mesh is still visible.  Lab Results:   Recent Labs  08/17/17 0450  WBC 7.8  HGB 8.2*  HCT 25.5*  PLT 749*    BMET  Recent Labs  08/17/17 0450 08/18/17 0332  NA 136 137  K 3.6 3.6  CL 106 106  CO2 24 22  GLUCOSE 123* 114*  BUN 18 14  CREATININE 0.53* 0.51*  CALCIUM 8.8* 9.2   PT/INR No results for input(s): LABPROT, INR in the last 72 hours.   Recent Labs Lab 08/13/17 0751 08/17/17 0450  AST 20 17  ALT 29 20  ALKPHOS 203* 138*  BILITOT 0.5 0.5  PROT 6.8 6.3*  ALBUMIN 2.5* 2.3*     Lipase     Component Value Date/Time   LIPASE 27 09/15/2015  0934     Medications: . acetaminophen  1,000 mg Oral TID  . atorvastatin  10 mg Oral q1800  . Chlorhexidine Gluconate Cloth  6 each Topical Daily  . diazepam  10 mg Oral BID  . enoxaparin (LOVENOX) injection  40 mg Subcutaneous Q24H  . feeding supplement  1 Container Oral Q24H  . feeding supplement (ENSURE ENLIVE)  237 mL Oral Q24H  . fentaNYL  175 mcg Transdermal Q72H  . fentaNYL   Intravenous Q4H  . gabapentin  300 mg Oral QHS  . labetalol  50 mg Oral BID  . lidocaine  2 patch Transdermal Q24H  . methocarbamol  750 mg Oral TID  . multivitamin with minerals  1 tablet Oral Daily  . nicotine  14 mg Transdermal Daily  . OLANZapine zydis  10 mg Oral QHS  . pantoprazole  40 mg Oral Q1200  . psyllium  1 packet Oral Daily  . saccharomyces boulardii  250 mg Oral BID   . chlorproMAZINE (THORAZINE) IV    . lactated ringers    . magnesium sulfate 1 - 4 g bolus IVPB Stopped (08/18/17 1110)  . ondansetron [facility]) IV 8 mg (08/14/17 6144)  . potassium chloride 10 mEq (08/18/17 1139)  . TPN (CLINIMIX) Adult without lytes     Anti-infectives    Start     Dose/Rate  Route Frequency Ordered Stop   08/11/17 1400  fluconazole (DIFLUCAN) IVPB 100 mg  Status:  Discontinued     100 mg 50 mL/hr over 60 Minutes Intravenous Every 24 hours 08/11/17 1312 08/16/17 1847   08/01/17 0600  metroNIDAZOLE (FLAGYL) IVPB 500 mg  Status:  Discontinued     500 mg 100 mL/hr over 60 Minutes Intravenous On call to O.R. 07/31/17 1656 07/31/17 1709   08/01/17 0600  ciprofloxacin (CIPRO) IVPB 400 mg  Status:  Discontinued     400 mg 200 mL/hr over 60 Minutes Intravenous On call to O.R. 07/31/17 1656 07/31/17 1709   07/31/17 0556  metroNIDAZOLE (FLAGYL) IVPB 500 mg     500 mg 100 mL/hr over 60 Minutes Intravenous On call to O.R. 07/31/17 0556 07/31/17 0840   07/31/17 0556  ciprofloxacin (CIPRO) IVPB 400 mg     400 mg 200 mL/hr over 60 Minutes Intravenous On call to O.R. 07/31/17 0556 07/31/17 0914   07/08/17 1600   fluconazole (DIFLUCAN) IVPB 100 mg     100 mg 50 mL/hr over 60 Minutes Intravenous Every 24 hours 07/08/17 0828 07/14/17 1710   07/01/17 1500  fluconazole (DIFLUCAN) IVPB 100 mg     100 mg 50 mL/hr over 60 Minutes Intravenous Every 24 hours 07/01/17 1401 07/07/17 1715   06/16/17 1200  piperacillin-tazobactam (ZOSYN) IVPB 3.375 g     3.375 g 12.5 mL/hr over 240 Minutes Intravenous Every 8 hours 06/16/17 1026 06/17/17 0004   06/08/17 1200  piperacillin-tazobactam (ZOSYN) IVPB 3.375 g  Status:  Discontinued     3.375 g 12.5 mL/hr over 240 Minutes Intravenous Every 8 hours 06/08/17 1056 06/16/17 1026   05/29/17 2200  ceFEPIme (MAXIPIME) 2 g in dextrose 5 % 50 mL IVPB  Status:  Discontinued     2 g 100 mL/hr over 30 Minutes Intravenous Every 8 hours 05/29/17 2031 06/03/17 0832   05/29/17 2100  metroNIDAZOLE (FLAGYL) IVPB 500 mg  Status:  Discontinued     500 mg 100 mL/hr over 60 Minutes Intravenous Every 8 hours 05/29/17 2029 06/03/17 0832   05/26/17 2200  ceFAZolin (ANCEF) IVPB 1 g/50 mL premix  Status:  Discontinued     1 g 100 mL/hr over 30 Minutes Intravenous Every 8 hours 05/26/17 1008 05/29/17 2023   05/25/17 1100  vancomycin (VANCOCIN) 1,250 mg in sodium chloride 0.9 % 250 mL IVPB  Status:  Discontinued     1,250 mg 166.7 mL/hr over 90 Minutes Intravenous Every 12 hours 05/25/17 0955 05/26/17 0946   05/22/17 1400  anidulafungin (ERAXIS) 100 mg in sodium chloride 0.9 % 100 mL IVPB     100 mg 78 mL/hr over 100 Minutes Intravenous Every 24 hours 05/21/17 1330 05/27/17 1740   05/21/17 1400  anidulafungin (ERAXIS) 200 mg in sodium chloride 0.9 % 200 mL IVPB     200 mg 78 mL/hr over 200 Minutes Intravenous  Once 05/21/17 1330 05/21/17 1940   05/15/17 1000  anidulafungin (ERAXIS) 100 mg in sodium chloride 0.9 % 100 mL IVPB  Status:  Discontinued     100 mg 78 mL/hr over 100 Minutes Intravenous Every 24 hours 05/14/17 0829 05/16/17 0901   05/15/17 1000  metroNIDAZOLE (FLAGYL) IVPB 500 mg   Status:  Discontinued     500 mg 100 mL/hr over 60 Minutes Intravenous Every 8 hours 05/15/17 0903 05/25/17 0938   05/14/17 0900  anidulafungin (ERAXIS) 200 mg in sodium chloride 0.9 % 200 mL IVPB     200  mg 78 mL/hr over 200 Minutes Intravenous  Once 05/14/17 0829 05/14/17 1325   05/13/17 1927  vancomycin (VANCOCIN) 1-5 GM/200ML-% IVPB    Comments:  Ward, Christa   : cabinet override      05/13/17 1927 05/14/17 0744   05/12/17 1400  ceFEPIme (MAXIPIME) 1 g in dextrose 5 % 50 mL IVPB     1 g 100 mL/hr over 30 Minutes Intravenous Every 8 hours 05/12/17 0939 05/26/17 2359   05/12/17 0900  vancomycin (VANCOCIN) IVPB 1000 mg/200 mL premix  Status:  Discontinued     1,000 mg 200 mL/hr over 60 Minutes Intravenous Every 12 hours 05/12/17 0750 05/16/17 0852   05/12/17 0830  aztreonam (AZACTAM) 2 GM IVPB     2 g 100 mL/hr over 30 Minutes Intravenous  Once 05/12/17 0800 05/12/17 0957   05/06/17 0916  vancomycin (VANCOCIN) IVPB 1000 mg/200 mL premix     1,000 mg 200 mL/hr over 60 Minutes Intravenous On call to O.R. 05/06/17 0916 05/06/17 1229      Assessment/Plan Adrenal mass/hx of prior hemicolectomy/ileocolic anastomosis 6712 1. S/p laparoscopic converted to open right adrenalectomy, small bowel resection with anastomosis, placement of 20cm^2 wound vac, 05/06/17, Dr. Lurena Joiner [name] 2. Post op wound dehiscence - primary repair of small bowel anastomotic leak, abdominal closure of wound dehisence, 05/13/17, Luke [name] 3. S/p reopening of recent laparotomy, lysis of adhesions, small bowel resection, placement 200cm^2 negative pressure dressing, 05/14/17, Luke [name] 4. S/p reopening of recent laparotomy, creation of tube ileostomy, placement of 100cm2 negative pressure dressing, 05/17/17, Luke [name] 5. S/p abdominal wash out, placement of mesh for abdominal closure, placement of wound vac of 100cm^2, 05/20/17, Luke [name] 6. S/p Bronchoscopy 05/22/17 Dr. Simonne Maffucci 7.  Laparoscopy, laparotomy, extensive enterolysis with removal of mesh (partially) in the right upper quadrant; resection of old anastomosis and ileotransverse anastomosis, 07/31/17,Dr.Matthew Martin POD 7 On Bed rest day 7 - OOB 08/12/17 EC fistula 06/13/17 - noted on exam Sepsis -  Acute respiratory failure/ post op HCAP pneumonia/post op pleural effusion - CCM/extubated 05/28/17 Malnutrition - PICC/TNA 05/14/17 Oral thrush  Hx of hypertension Hx of anemia Hx of anxiety Hx of hypothyroid Chronic pain medicine use - neck pain - Pt being followed by Palliative to assist with pain control. Tobacco use FEN: Regular diet 08/14/17/cyclic TNA - started 45/8/09 - pharmacy adjusting electrolytes/Phos ID: Vancomycin 7/11- 05/25/17, Maixpime 7/17-7/31/18, Eraxis 7/19-05/27/17, Diflucan 9/5-9/14,& 10/16-10/21/18;  Cipro/ Flagyl pre-op 07/31/17 DVT: Lovenox Foley: Out Follow up: [facility] Done  Plan: Continue current treatments.        LOS: 104 days    Noha Milberger 08/18/2017 [phone]

## 2017-08-18 NOTE — Progress Notes (Signed)
Physical Therapy Treatment Patient Details Name: [name] [name] MRN: [id number] DOB: 1961-08-13 Today's Date: 08/18/2017    History of Present Illness  56 y.o. male admitted 7/11 with right adrenal mass , s/p  right adrenalectomy complicated by small bowel injury requiring resection with anastomosis . Has required multiple surgeris, the last being 10/5 for  Laparoscopy, laparotomy, extensive enterolysis with removal of mesh (partially) in the right upper quadrant; resection of old anastomosis and ileotransverse anastomosiss    PT Comments    Upon standing up from bed and starting to ambulate, noted that the left knee buckled, patient complained of pain medially . Performed warm up stand in place walking prior to ambulating.   The patient only ambulated 50' x 2 today, whereas he ambulated x 440' yesterday. The patient indicates left medial knee tenderness near joint line. It is noted in EPIC that patient had an arthroscopy  By Dr. Mayer Camel in 2014. May benefit from ortho consult as this  Complaint is highly knew  Onset since yesterday. Continue PT.  Follow Up Recommendations  SNF;Supervision/Assistance - 24 hour;Home health PT     Equipment Recommendations  Rolling walker with 5" wheels    Recommendations for Other Services       Precautions / Restrictions Precautions Precautions: Fall Precaution Comments: abdominal wounds, left knee is buckling today    Mobility  Bed Mobility Overal bed mobility: Needs Assistance Bed Mobility: Sidelying to Sit Rolling: Min assist Sidelying to sit: Min assist       General bed mobility comments: cues for rolling , for sidelying to protect abdominal surgeries. Assist with trunk once on the side.   Transfers Overall transfer level: Needs assistance Equipment used: Rolling walker (2 wheeled) Transfers: Sit to/from Stand Sit to Stand: Min assist         General transfer comment: upon standing, patient reported that his left knee was painful,  noted to buckle , performed walk inplace with noted antalgia and knee instability.  patient reports medial knee is painful.  Ambulation/Gait Ambulation/Gait assistance: Mod assist;+2 safety/equipment Ambulation Distance (Feet): 50 Feet (x 2) Assistive device: Rolling walker (2 wheeled) Gait Pattern/deviations: Step-to pattern;Step-through pattern;Antalgic     General Gait Details: the patient is noted to have antalgic gait on the left, noted the left knee "wobbles" during stance with slight hyper knee extension.  Platient limited gait by p c/O pain in left knee   Stairs            Wheelchair Mobility    Modified Rankin (Stroke Patients Only)       Balance Overall balance assessment: Needs assistance         Standing balance support: Bilateral upper extremity supported;During functional activity Standing balance-Leahy Scale: Poor Standing balance comment: due to left knee pain possibly                            Cognition Arousal/Alertness: Awake/alert Behavior During Therapy: Flat affect                                          Exercises      General Comments        Pertinent Vitals/Pain Pain Score: 7  Pain Location: L medial knee-  Pain Intervention(s): Limited activity within patient's tolerance;Monitored during session;Ice applied    Home Living  Prior Function            PT Goals (current goals can now be found in the care plan section) Progress towards PT goals: Progressing toward goals    Frequency    Min 3X/week      PT Plan Current plan remains appropriate    Co-evaluation              AM-PAC PT "6 Clicks" Daily Activity  Outcome Measure  Difficulty turning over in bed (including adjusting bedclothes, sheets and blankets)?: A Lot Difficulty moving from lying on back to sitting on the side of the bed? : A Lot Difficulty sitting down on and standing up from a chair  with arms (e.g., wheelchair, bedside commode, etc,.)?: A Lot Help needed moving to and from a bed to chair (including a wheelchair)?: A Lot Help needed walking in hospital room?: A Lot Help needed climbing 3-5 steps with a railing? : A Lot 6 Click Score: 12    End of Session Equipment Utilized During Treatment: Gait belt Activity Tolerance: Patient limited by pain Patient left: in chair;with call bell/phone within reach;with chair alarm set;with nursing/sitter in room Nurse Communication: Mobility status PT Visit Diagnosis: Difficulty in walking, not elsewhere classified (R26.2);Muscle weakness (generalized) (M62.81)     Time: 1025-1110 PT Time Calculation (min) (ACUTE ONLY): 45 min  Charges:  $Gait Training: 23-37 mins $Therapeutic Activity: 8-22 mins                    G CodesTresa [name] PT [phone]    [name] [name] 08/18/2017, 12:04 PM

## 2017-08-18 NOTE — Progress Notes (Signed)
Calorie Count Note  48 hour calorie count ordered (10/22-10/24).  Diet: Regular Supplements: Boost Breeze once/day and Ensure Enlive once/day   Breakfast 10/22: 88 kcal and 1.5 grams protein Lunch 10/22: did not order Dinner 10/22: meal ordered, but meal ticket not in envelope on pt's door Breakfast 10/23: 123 kcal and 4.5 grams protein Supplements: refused all supplements yesterday  Total intake: 211 kcal (10% of minimum estimated needs)  6 protein (4% of minimum estimated needs)  Nutrition Dx: Inadequate oral intake related to poor appetite as evidenced by per patient/family report, meal completion < 25%.  Goal: Patient will meet greater than or equal to 90% of their needs Patient will meet greater than or equal to 90% of their needs   Intervention:  - Continue supplements. - Continue to encourage PO intakes. - Continue current cyclic TPN regimen.      Jarome Matin, MS, RD, LDN, [facility] Inpatient Clinical Dietitian Pager # [phone] After hours/weekend pager # [phone]

## 2017-08-19 LAB — BASIC METABOLIC PANEL
Anion gap: 8 (ref 5–15)
BUN: 14 mg/dL (ref 6–20)
CALCIUM: 9 mg/dL (ref 8.9–10.3)
CO2: 25 mmol/L (ref 22–32)
Chloride: 106 mmol/L (ref 101–111)
Creatinine, Ser: 0.55 mg/dL — ABNORMAL LOW (ref 0.61–1.24)
GFR calc Af Amer: >60 mL/min (ref >60)
Glucose, Bld: 105 mg/dL — ABNORMAL HIGH (ref 65–99)
POTASSIUM: 3.7 mmol/L (ref 3.5–5.1)
SODIUM: 139 mmol/L (ref 135–145)

## 2017-08-19 LAB — MAGNESIUM: MAGNESIUM: 2.3 mg/dL (ref 1.7–2.4)

## 2017-08-19 LAB — PHOSPHORUS: PHOSPHORUS: 4.7 mg/dL — AB (ref 2.5–4.6)

## 2017-08-19 MED ORDER — OXYCODONE HCL 5 MG PO TABS: 5.0000 mg | ORAL_TABLET | Freq: Four times a day (QID) | ORAL | Status: DC | PRN

## 2017-08-19 MED ORDER — POTASSIUM CHLORIDE 10 MEQ/50ML IV SOLN
10.0000 meq | INTRAVENOUS | Status: AC
  Administered 2017-08-19 (×5): 10 meq via INTRAVENOUS
  Filled 2017-08-19 (×5): qty 50

## 2017-08-19 MED ORDER — TRACE MINERALS CR-CU-MN-SE-ZN 10-1000-500-60 MCG/ML IV SOLN
INTRAVENOUS | Status: AC
  Administered 2017-08-19: 18:00:00 via INTRAVENOUS
  Filled 2017-08-19: qty 1990

## 2017-08-19 MED ORDER — FAT EMULSION 20 % IV EMUL
180.0000 mL | INTRAVENOUS | Status: AC
  Administered 2017-08-19: 180 mL via INTRAVENOUS
  Filled 2017-08-19: qty 250

## 2017-08-19 MED ORDER — MAGNESIUM SULFATE 4 GM/100ML IV SOLN
4.0000 g | Freq: Once | INTRAVENOUS | Status: AC
  Administered 2017-08-19: 4 g via INTRAVENOUS
  Filled 2017-08-19: qty 100

## 2017-08-19 NOTE — Progress Notes (Signed)
Dressing changed to abdominal and right flank wounds. Pt tolerated with no problems.

## 2017-08-19 NOTE — Progress Notes (Signed)
[facility] NOTE   Pharmacy Consult for TPN Indication: prolonged ileus, small bowel leak, multiple bowel surgeries  Patient Measurements: Body mass index is 25.5 kg/m. Filed Weights   08/16/17 0400 08/17/17 0448 08/19/17 0500  Weight: 178 lb 9.2 oz (81 kg) 177 lb 4 oz (80.4 kg) 177 lb 11.1 oz (80.6 kg)   HPI: 68 yoM admitted on 7/11 for enlarging adrenal mass and planned adrenalectomy.  Pharmacy consulted to dose TPN.  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   7/22 OR:  wash out, tube ileostomy, left with open abdomen & wound vac in place.  Plan for OR on 7/25 for closure 7/25 OR: mesh placed for abdominal closure, wound vac 8/2 extubated, propofol drip off 8/4 TPN off for ~ 5 hours due to line detached from filter 8/10 MD request decrease rate x 1 week 2nd low sodium 8/12 Additional sodium added to TPN due to persistent hyponatremia 8/16 Increasing rate back to goal since sodium has improved with above intervention.   8/20 Prealbumin decreased.  New apparent enteric leak per surgery notes. 8/21 CaPhos product elevated, remove electrolytes from TPN 8/25 PICC did not have blood return in 2 of the 3 ports.  Alteplase intra-catheter and function returned.  TPN infusing correctly on 8/26 per RN. 8/28: d/c SSI 8/30: added electrolytes to TPN 8/31: TPN IV line came out around ~0340 and current bag was stopped at this time.  Restart new clinimix 5/15 1L bag (no lytes since phos is elevated this morning) until next bag is due at [facility]. Will not add MVI and trace elements to this 1L bag.  Use electrolyte free clinimix with new bag at [facility] 9/1 NGT out 9/3 added electrolytes to TPN 9/4 lytes out of TPN 9/7 cycle over 18 hrs 9/8 add back electrolytes to TPN.  Cycle over 14 hours 9/9 lytes out of TPN, continue 14 hr cycle, DC SSI/CBGs 9/13 electrolytes added back to TPN 9/14 remove electrolytes from TPN 10/3 Adjusted estimated needs based on prealbumin (decreased on 9/24, low but slightly improved on 10/1) and attempt to maximize nutrition prior to surgery planned for 07/31/17 - required increase of cycle to 16 hours. 10/5 to OR for fistula-repair surgery 10/6: Lytes added back to TPN with low Phos 10/10 D/C CBGs 10/14 NGT clamp trial 10/16: CLD ordered.  Per RN notes patient tolerated liquids, ice chips, small amnt jello 10/17: NGT removed 10/19: Diet advanced to Regular diet  10/21: Pharmacy received consult to wean TPN off.   10/22: Clarified with Dr. Rosendo Gros to gradually decrease TPN over next 2-days with plans to stop by mid-week 10/23: Oral intake remains <= 25% of needs. Refer to dietician note for details.  No anti-emetic use today  Insulin requirements past 24 hours: none  Current Nutrition: regular diet ordered to start 10/19, Boost, Ensure & Magic Cup daily  IVF: none  Central access: PICC line ordered 7/19, placed on 7/20 TPN start date: 7/20  ASSESSMENT  Today, 08/19/17  - Glucose:  (goal 100-150) At goal.  SSI/CBGs discontinued 10/10. - Electrolytes:  K 3.7  - daily IV replacement  Mag 2.3  - daily IV replacement  Phos 5.3- has been variable, lytes removed from TPN to avoid high Phos levels  Na WNL- have been providing ~42 mEq/L Na in TPN  Corr Ca 10.6; Ca/phos product 56  (goal < 55) - Renal:  SCr wnl, stable - NGT removed. Weight looks OK. Using some PRN promethazine, less use of Zofran, no emesis documented. - LFTs:  WNL except Alk Phos  which is elevated but trending down - TGs:  Slightly elevated prior to starting TPN, but now WNL- 141 on 10/22 - Prealbumin: Low on admission but trended up and remained at or near goal during Aug to Sep;   (15.6 on 10/15 to 13.3 10/22).    NUTRITIONAL GOALS                                                                                             RD recs (10/8):  137-153 gms Protein (1.7 - 1.9 g/kg) 2030 - 2270 Kcal (25-28 kcal/kg)  Ensure Enlive once/day provides 350 kcal and 20 grams of protein. Boost Breeze once/day provides 250 kcal and 9 grams of protein. Magic Cup BID with meals, each supplement provides 290 kcal and 9 grams of protein  Recalculation (10/3): Clinimix 5/15 3000 mL over 16h cycle with lipids 3x/week on MWF only would provide: 150 g protein and avg 2335 kcal/day  Glucose infusion rate will be 6.5 mg/kg/min at max cyclic rate. (Maximum 7 mg/kg/min)  PLAN                                                                                                                         Now:  KCl 10 mEq IV x 5  Mag 4g IV x 1  At 1800 today:  Based on PO intake, Continued reduced Clinimix 5/15 (electrolyte free) to 1990 mL/day cycled over 16 hr.  TPN will provide 108.5gm protein (~78% lower end goal) and avg 1694 kcals (83% lower end goal)  50 ml/hr x 1 hr (6p-7p), 135 ml/hr x 14 hr (7p-9a), 50 ml/hr x 1 hr (9a-10a), then off  Continue sodium in TPN as patient has been receiving since Na stable: adding 50 mEq NaCl and 35 mEq Na Acetate in the 2L TPN bag  Lipids at 15 mls/hr x 12 hours 3 times weekly on MWF  Standard MVI (also on multivitamin), Trace elements, and pepcid 40 mg/day in TPN  Pepcid and IV protonix for coffee ground material from  NGT in early August.  In October had some blood in NGT  TPN lab panels on Mondays & Thursdays.   BMET, Mag & Phos daily ordered.  Follow up patient's oral intake. Will continue TPN until enteral intake is meeting >60% nutrition goals  or otherwise requested by surgery.  Doreene Eland, PharmD, BCPS.   Pager: [phone] 08/19/2017 7:31 AM

## 2017-08-19 NOTE — Progress Notes (Signed)
Physical Therapy Treatment Patient Details Name: [name] [name] MRN: [id number] DOB: November 12, 1960 Today's Date: 08/19/2017    History of Present Illness  56 y.o. male admitted 7/11 with right adrenal mass , s/p  right adrenalectomy complicated by small bowel injury requiring resection with anastomosis . Has required multiple surgeris, the last being 10/5 for  Laparoscopy, laparotomy, extensive enterolysis with removal of mesh (partially) in the right upper quadrant; resection of old anastomosis and ileotransverse anastomosiss    PT Comments    The patient reports that the left knee is less painful. Ambulated around the unit x 1 with 2 assist. Appeared very fatigued after ambulating, pushed himself to make it around. Continue PT.   Follow Up Recommendations  SNF;Supervision/Assistance - 24 hour;Home health PT     Equipment Recommendations  Rolling walker with 5" wheels    Recommendations for Other Services       Precautions / Restrictions Precautions Precautions: Fall Precaution Comments: abdominal wounds, left knee is buckling  a little Required Braces or Orthoses: Other Brace/Splint Other Brace/Splint: wrap left knee w/ ace wrap    Mobility  Bed Mobility Overal bed mobility: Needs Assistance Bed Mobility: Sidelying to Sit   Sidelying to sit: Mod assist     Sit to sidelying: Mod assist General bed mobility comments: assist with trunk and legs  Transfers Overall transfer level: Needs assistance Equipment used: Rolling walker (2 wheeled) Transfers: Sit to/from Stand Sit to Stand: Min assist;+2 safety/equipment         General transfer comment: upon standing, patient reported that his left knee was less  painful, noted to buckle slightly , performed walk in place with noted increased weight placed on the left today.   Ambulation/Gait Ambulation/Gait assistance: Mod assist;+2 safety/equipment Ambulation Distance (Feet): 440 Feet Assistive device: Rolling walker (2  wheeled) Gait Pattern/deviations: Step-to pattern;Step-through pattern;Shuffle     General Gait Details: noted decreased stability of left knee during stance , buckled x 2 during gait. Noted shuffle of the right foot. Tolerated a lap around, slow pace. HR 115   Stairs            Wheelchair Mobility    Modified Rankin (Stroke Patients Only)       Balance           Standing balance support: Bilateral upper extremity supported;During functional activity Standing balance-Leahy Scale: Poor Standing balance comment: tends to not move head and look around.                            Cognition Arousal/Alertness: Awake/alert Behavior During Therapy: Flat affect                                          Exercises      General Comments        Pertinent Vitals/Pain Faces Pain Scale: Hurts whole lot Pain Location: Abdominal binder, reports left knee is less painful than previous visit Pain Descriptors / Indicators: Grimacing;Operative site guarding    Home Living                      Prior Function            PT Goals (current goals can now be found in the care plan section) Progress towards PT goals: Progressing toward goals    Frequency  Min 3X/week      PT Plan Current plan remains appropriate    Co-evaluation              AM-PAC PT "6 Clicks" Daily Activity  Outcome Measure  Difficulty turning over in bed (including adjusting bedclothes, sheets and blankets)?: A Lot Difficulty moving from lying on back to sitting on the side of the bed? : A Lot Difficulty sitting down on and standing up from a chair with arms (e.g., wheelchair, bedside commode, etc,.)?: A Lot Help needed moving to and from a bed to chair (including a wheelchair)?: A Lot Help needed walking in hospital room?: A Lot Help needed climbing 3-5 steps with a railing? : A Lot 6 Click Score: 12    End of Session Equipment Utilized During  Treatment: Gait belt Activity Tolerance: Patient tolerated treatment well Patient left: in bed;with call bell/phone within reach;with bed alarm set Nurse Communication: Mobility status PT Visit Diagnosis: Difficulty in walking, not elsewhere classified (R26.2);Muscle weakness (generalized) (M62.81)     Time: 9485-4627 PT Time Calculation (min) (ACUTE ONLY): 26 min  Charges:  $Gait Training: 23-37 mins                    G CodesTresa Endo PT [phone]    [name] 08/19/2017, 12:06 PM

## 2017-08-19 NOTE — Care Management Note (Signed)
Case Management Note  Patient Details  Name: [name] [name] MRN: [id number] Date of Birth: December 31, 1960  Subjective/Objective:                  Mesh still visible in the wound bed,some exudate present,iv pain meds and iv tpn  Action/Plan: Date:  August 19 2017 Chart reviewed for concurrent status and case management needs.  Will continue to follow patient progress.  Discharge Planning: following for needs  Expected discharge date: August 22, 2017  [name], BSN, [place], [place]   Expected Discharge Date:                  Expected Discharge Plan:  Home/Self Care  In-House Referral:     Discharge planning Services  CM Consult  Post Acute Care Choice:    Choice offered to:     DME Arranged:    DME Agency:     HH Arranged:    HH Agency:     Status of Service:  In process, will continue to follow  If discussed at Long Length of Stay Meetings, dates discussed:    Additional Comments:  Leeroy Cha, RN 08/19/2017, 9:20 AM

## 2017-08-19 NOTE — Progress Notes (Signed)
   08/19/17 1431  Clinical Encounter Type  Visited With Patient  Visit Type Follow-up  Spiritual Encounters  Spiritual Needs Emotional;Prayer  Stress Factors  Patient Stress Factors Other (Comment) (Wants to know when he can go home)   Have visited with the patient over a number of weeks and this is the first time I have encountered him being really down.  He wants some hope and good news about a date when he can go home. He states he is trying as hard as he can to eat and move and really wants to go home.  We talked for a while and prayed together.  Will follow as needed. Chaplain Katherene Ponto

## 2017-08-19 NOTE — Progress Notes (Signed)
[facility] Progress Note  19 Days Post-Op  Subjective: CC:  Patient is tearful today, expressed that he feels like he is never getting out of the hospital. Misses his children and his house. Would like to go home and get PT/OT at home and have his wife help with wound care. Discussed needing to wean off IV pain medication prior to getting home.  Reports abdominal pain is generalized but also feels pulling in the LLQ when sitting up straight. Taking in a little less than 50% of food/drink brought to him. Having BMs. Patient still having some left knee pain in the inferomedial aspect of his knee. Would like for Dr. Mayer Camel to see him and evaluate.   Objective: Vital signs in last 24 hours: Temp:  [97.6 F (36.4 C)-98.5 F (36.9 C)] 98.5 F (36.9 C) (10/24 1202) Pulse Rate:  [83-96] 96 (10/24 1200) Resp:  [14-21] 16 (10/24 1200) BP: (99-116)/(52-74) 101/73 (10/24 1200) SpO2:  [96 %-99 %] 98 % (10/24 1200) FiO2 (%):  [99 %] 99 % (10/23 1325) Weight:  [80.6 kg (177 lb 11.1 oz)] 80.6 kg (177 lb 11.1 oz) (10/24 0500) Last BM Date: 08/18/17  Intake/Output from previous day: 10/23 0701 - 10/24 0700 In: 941.6 [I.V.:441.6; IV Piggyback:500] Out: 900 [Urine:900] Intake/Output this shift: Total I/O In: 920 [P.O.:300; I.V.:420; IV Piggyback:200] Out: 625 [Urine:625]  PE: Gen:  Alert, NAD, pleasant Card:  Regular rate and rhythm Pulm:  Normal effort, clear to auscultation bilaterally Abd: Soft, not distended, +BS; midline wound clean with good granulation tissue; lateral wound with exudate, foul smelling, no obvious feculent drainage, mesh visible.   Ext: Left knee TTP along inferomedial aspect, no erythema/warmth/swelling, ROM intact  Skin: warm and dry, no rashes  Psych: A&Ox3   Lab Results:   Recent Labs  08/17/17 0450  WBC 7.8  HGB 8.2*  HCT 25.5*  PLT 749*   BMET  Recent Labs  08/18/17 0332 08/19/17 0515  NA 137 139  K 3.6 3.7  CL 106 106  CO2 22 25   GLUCOSE 114* 105*  BUN 14 14  CREATININE 0.51* 0.55*  CALCIUM 9.2 9.0   PT/INR No results for input(s): LABPROT, INR in the last 72 hours. CMP     Component Value Date/Time   NA 139 08/19/2017 0515   K 3.7 08/19/2017 0515   CL 106 08/19/2017 0515   CO2 25 08/19/2017 0515   GLUCOSE 105 (H) 08/19/2017 0515   BUN 14 08/19/2017 0515   CREATININE 0.55 (L) 08/19/2017 0515   CREATININE 0.85 07/22/2013 1021   CALCIUM 9.0 08/19/2017 0515   PROT 6.3 (L) 08/17/2017 0450   ALBUMIN 2.3 (L) 08/17/2017 0450   AST 17 08/17/2017 0450   ALT 20 08/17/2017 0450   ALKPHOS 138 (H) 08/17/2017 0450   BILITOT 0.5 08/17/2017 0450   GFRNONAA >60 08/19/2017 0515   GFRAA >60 08/19/2017 0515   Lipase     Component Value Date/Time   LIPASE 27 09/15/2015 0934       Studies/Results: No results found.  Anti-infectives: Anti-infectives    Start     Dose/Rate Route Frequency Ordered Stop   08/11/17 1400  fluconazole (DIFLUCAN) IVPB 100 mg  Status:  Discontinued     100 mg 50 mL/hr over 60 Minutes Intravenous Every 24 hours 08/11/17 1312 08/16/17 1847   08/01/17 0600  metroNIDAZOLE (FLAGYL) IVPB 500 mg  Status:  Discontinued     500 mg 100 mL/hr over 60 Minutes Intravenous On call  to O.R. 07/31/17 1656 07/31/17 1709   08/01/17 0600  ciprofloxacin (CIPRO) IVPB 400 mg  Status:  Discontinued     400 mg 200 mL/hr over 60 Minutes Intravenous On call to O.R. 07/31/17 1656 07/31/17 1709   07/31/17 0556  metroNIDAZOLE (FLAGYL) IVPB 500 mg     500 mg 100 mL/hr over 60 Minutes Intravenous On call to O.R. 07/31/17 0556 07/31/17 0840   07/31/17 0556  ciprofloxacin (CIPRO) IVPB 400 mg     400 mg 200 mL/hr over 60 Minutes Intravenous On call to O.R. 07/31/17 0556 07/31/17 0914   07/08/17 1600  fluconazole (DIFLUCAN) IVPB 100 mg     100 mg 50 mL/hr over 60 Minutes Intravenous Every 24 hours 07/08/17 0828 07/14/17 1710   07/01/17 1500  fluconazole (DIFLUCAN) IVPB 100 mg     100 mg 50 mL/hr over 60  Minutes Intravenous Every 24 hours 07/01/17 1401 07/07/17 1715   06/16/17 1200  piperacillin-tazobactam (ZOSYN) IVPB 3.375 g     3.375 g 12.5 mL/hr over 240 Minutes Intravenous Every 8 hours 06/16/17 1026 06/17/17 0004   06/08/17 1200  piperacillin-tazobactam (ZOSYN) IVPB 3.375 g  Status:  Discontinued     3.375 g 12.5 mL/hr over 240 Minutes Intravenous Every 8 hours 06/08/17 1056 06/16/17 1026   05/29/17 2200  ceFEPIme (MAXIPIME) 2 g in dextrose 5 % 50 mL IVPB  Status:  Discontinued     2 g 100 mL/hr over 30 Minutes Intravenous Every 8 hours 05/29/17 2031 06/03/17 0832   05/29/17 2100  metroNIDAZOLE (FLAGYL) IVPB 500 mg  Status:  Discontinued     500 mg 100 mL/hr over 60 Minutes Intravenous Every 8 hours 05/29/17 2029 06/03/17 0832   05/26/17 2200  ceFAZolin (ANCEF) IVPB 1 g/50 mL premix  Status:  Discontinued     1 g 100 mL/hr over 30 Minutes Intravenous Every 8 hours 05/26/17 1008 05/29/17 2023   05/25/17 1100  vancomycin (VANCOCIN) 1,250 mg in sodium chloride 0.9 % 250 mL IVPB  Status:  Discontinued     1,250 mg 166.7 mL/hr over 90 Minutes Intravenous Every 12 hours 05/25/17 0955 05/26/17 0946   05/22/17 1400  anidulafungin (ERAXIS) 100 mg in sodium chloride 0.9 % 100 mL IVPB     100 mg 78 mL/hr over 100 Minutes Intravenous Every 24 hours 05/21/17 1330 05/27/17 1740   05/21/17 1400  anidulafungin (ERAXIS) 200 mg in sodium chloride 0.9 % 200 mL IVPB     200 mg 78 mL/hr over 200 Minutes Intravenous  Once 05/21/17 1330 05/21/17 1940   05/15/17 1000  anidulafungin (ERAXIS) 100 mg in sodium chloride 0.9 % 100 mL IVPB  Status:  Discontinued     100 mg 78 mL/hr over 100 Minutes Intravenous Every 24 hours 05/14/17 0829 05/16/17 0901   05/15/17 1000  metroNIDAZOLE (FLAGYL) IVPB 500 mg  Status:  Discontinued     500 mg 100 mL/hr over 60 Minutes Intravenous Every 8 hours 05/15/17 0903 05/25/17 0938   05/14/17 0900  anidulafungin (ERAXIS) 200 mg in sodium chloride 0.9 % 200 mL IVPB     200  mg 78 mL/hr over 200 Minutes Intravenous  Once 05/14/17 0829 05/14/17 1325   05/13/17 1927  vancomycin (VANCOCIN) 1-5 GM/200ML-% IVPB    Comments:  Ward, Christa   : cabinet override      05/13/17 1927 05/14/17 0744   05/12/17 1400  ceFEPIme (MAXIPIME) 1 g in dextrose 5 % 50 mL IVPB     1  g 100 mL/hr over 30 Minutes Intravenous Every 8 hours 05/12/17 0939 05/26/17 2359   05/12/17 0900  vancomycin (VANCOCIN) IVPB 1000 mg/200 mL premix  Status:  Discontinued     1,000 mg 200 mL/hr over 60 Minutes Intravenous Every 12 hours 05/12/17 0750 05/16/17 0852   05/12/17 0830  aztreonam (AZACTAM) 2 GM IVPB     2 g 100 mL/hr over 30 Minutes Intravenous  Once 05/12/17 0800 05/12/17 0957   05/06/17 0916  vancomycin (VANCOCIN) IVPB 1000 mg/200 mL premix     1,000 mg 200 mL/hr over 60 Minutes Intravenous On call to O.R. 05/06/17 0916 05/06/17 1229       Assessment/Plan Acute respiratory failure/ post op HCAP pneumonia/post op pleural effusion - CCM/extubated 05/28/17 Malnutrition - PICC/TNA 05/14/17; albumin 2.3 and preablumin 13.3 on 10/22 Oral thrush  Hx of hypertension Hx of anemia Hx of anxiety Hx of hypothyroid Chronic pain medicine use - neck pain - Pt being followed by Palliative to assist with pain control. Tobacco use  Adrenal mass/hx of prior hemicolectomy/ileocolic anastomosis 5035 1. S/p laparoscopic converted to open right adrenalectomy, small bowel resection with anastomosis, placement of 20cm^2 wound vac, 05/06/17, Dr. Lurena Joiner [name] 2. Post op wound dehiscence - primary repair of small bowel anastomotic leak, abdominal closure of wound dehisence, 05/13/17, Luke [name] 3. S/p reopening of recent laparotomy, lysis of adhesions, small bowel resection, placement 200cm^2 negative pressure dressing, 05/14/17, Luke [name] 4. S/p reopening of recent laparotomy, creation of tube ileostomy, placement of 100cm2 negative pressure dressing, 05/17/17, Luke [name] 5. S/p abdominal wash  out, placement of mesh for abdominal closure, placement of wound vac of 100cm^2, 05/20/17, Luke [name] 6. S/p Bronchoscopy 05/22/17 Dr. Simonne Maffucci EC fistula 06/13/17 - noted on exam  7. Laparoscopy, laparotomy, extensive enterolysis with removal of mesh (partially) in the right upper quadrant; resection of old anastomosis and ileotransverse anastomosis, 07/31/17,Dr.Matthew Hassell Done  - POD#19 - continue dressing changes - continue therapies - encourage PO intake - discontinued IV dilaudid and added PO oxy IR - patient needs to try to begin weaning off IV pain medication   Left Knee pain - Will ask Dr. Mayer Camel who has seen the patient in the past - no swelling, redness or signs of joint infection - heat/ice prn  FEN: Regular diet 08/14/17/cyclic TNA - started 46/5/68 - pharmacy adjusting electrolytes/Phos ID: Vancomycin 7/11- 05/25/17, Maixpime 7/17-7/31/18, Eraxis 7/19-05/27/17, Diflucan 9/5-9/14,& 10/16-10/21/18;  Cipro/ Flagyl pre-op 07/31/17 DVT: Lovenox Foley: Out Follow up: Hassell Done  Plan: Continue current treatments. Will discuss lateral wound with MD. Encourage PO intake.  LOS: 105 days    Brigid Re , [facility] 08/19/2017, 1:13 PM Pager: [phone] Consults: [phone] Mon-Fri 7:00 am-4:30 pm Sat-Sun 7:00 am-11:30 am

## 2017-08-19 NOTE — Progress Notes (Addendum)
Nutrition Follow-up  DOCUMENTATION CODES:   Not applicable  INTERVENTION:  - Continue cyclic TPN per Pharmacy. - Continue Ensure Enlive and [employer] each once/day. - Continue to encourage PO intakes of meals and supplements.   NUTRITION DIAGNOSIS:   Inadequate oral intake related to poor appetite as evidenced by per patient/family report, meal completion < 25%. -ongoing  GOAL:   Patient will meet greater than or equal to 90% of their needs - met for kcal, unmet for protein with cyclic TPN and minimal PO intakes  MONITOR:   PO intake, Supplement acceptance, Weight trends, Labs, Skin, I & O's, Other (Comment) (TPN regimen)  ASSESSMENT:   56 y.o. 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 On 7/18, he developed a leak therefore was taken back to OR for re-do of ex-lap and repair of leak.  7/11 HW:EXHBZ adrenalectomy with complication of intestinal injury and small bowel resection with anastomosis  7/18 JI:RCVELF of small bowel anastomotic leak, abdominal closure of wound dehisence 7/19 OR: reopening of recent laparotomy, lysis of adhesions, noted ischemic perforation of new loop of intestine, intact repair of previous anastomotic leak repair, intact previous anastomosis.  7/22 OR: wash out, tube ileostomy, left with open abdomen &wound vac in place. Plan for OR on 7/25 for closure 7/25 OR: mesh placed for abdominal closure, wound vac 8/2extubated, propofol drip off 8/4TPN off for ~ 5 hours due to line detached from filter. Ultrasound-guided aspiration By interventional radiology of small perihepatic fluid collection yielded only 1 mL of the fluid, sent for culture. NPO - mild coffee ground on NG but no active bleed 8/6patient improving. Off Precedex drip since 8/4. Dilaudid Drip being  changed over to PCA. Still with some coffee ground-looking material in NG tube.  8/10 hyponatremia and plan to decrease TPN from 3L/day to 2L/day x1 week 8/40fund to have fecal matter in wound vac (EC fistula) 8/14increase in fecal matter presence in wound vac with no plan for surgery at this time 8/16TPN advanced to goal rate of 115 ml/hr with 20% ILE @ 20 mL/hr x12 hours on MWF 8/22: NGT clamped. RD to order Boost Breeze for patient to sip on; Mycostatin started for oral thrush. 9/1:pulled NGT and no plans for replacement. 9/1:developed N/V and only permitted sips of clears, if tolerated. 9/4:NGT re-insertion attempted several times without success 9/7:begin cycling TPN 9/23: allowed to leave the floor to go outside 10/5:Laparoscopy, laparotomy, extensive enterolysis with removal of mesh (partially) in the right upper quadrant; resection of old anastomosis and ileotransverse anastomosis 10/5:NGT placement during surgery 10/15:development of recurrent oral thrush 10/17:NGT removed at 10:45 AM 181/01 decrease cyclic TPN volume from 3L/day to 2L/day 10/22-10/24: Calorie Count   10/24 Day #2 of Calorie Count follow-up.  Dinner 10/23: 330 kcal, 14 grams of protein Breakfast 10/24: 151 kcal, 3.5 grams of protein  Total of these two meals: 481 kcal (24% minimum estimated kcal need), 17.5 grams protein (13% minimum estimated protein need).  Pt with triple lumen PICC and continues with cyclic TPN regimen: Clinimix 5/15 1990 mL over 14 hours with 20% ILE @ 20 mL/hr x12 hours on MWF. This regimen provides a daily average of 100 grams of protein (73% minimum estimated protein need), 1621 kcal (80% minimum estimated kcal need).  Weight has been stable since 08/06/17. No new Surgery note today.  Medications reviewed; 4 g IV Mg sulfate x1 run yesterday and x1 run today, daily multivitamin with minerals, 40 mg IV Protonix/day, 1- mEq IV KCl x6 runs yesterday and x5 runs today, 1  packet Metamucil/day, 250 mg Florastor BID. Labs reviewed; glucose: 105 mg/dL, creatinine: 0.55 mg/dL, Phos: 4.7 mg/dL.    10/22 - Diet advanced from Soft to Regular on 10/19 at 11:35 AM.  - Pt reports that he ate a few bites each of bacon and scrambled eggs. - He feels that he is taking more bites now than at the end of last week. - He is experiencing nausea after eating.  - He is open to trying antinausea medication before a meal to assess if this helps, but he does not want it to be something he has to do before every meal. - Pt usually eats bites at breakfast and dinner and has been skipping lunch.  - He has been sipping on oral nutrition supplements, when he does accept them.  - Spoke with Pharmacist who reports he received an order to wean TPN so he will decrease cyclic TPN from 3L/day to 2L/day starting today.  - Clinimix 5/15 x2L/day with 20% ILE @ 20 mL/hr x12 on MWF will provide a daily average of 100 grams of protein (73% minimum estimated protein need) and 1626 kcal (80% minimum estimated kcal need).  Meds: 4 g IV Mg sulfate x3 runs yesterday and x3 runs today, 10 mEq IV KCl x6 runs yesterday and x6 runs today Lab: Mg: 1.6 mg/dL    10/18 - Pt is POD #13.  - Diet advanced from NPO to CLD on 10/16 at 8:15 AM and then to Soft today at 8:05 AM. - Pt reports eating a few bites each of grits and scrambled egg and denies associated abdominal pain or nausea.  - He reports some abdominal pressure with he attributes to his need to go to the bathroom.  - Plan to continue cyclic TPN regimen.  - Weight has fluctuated over the past 1 week but primarily consistent with weight from prior to surgery on 10/5.  Meds: 4 g IV Mg sulfate x3 runs today, 10 mEq IV KCl x4 runs today Lab: Mg: 1.4 mg/dL    Diet Order:  Diet regular Room service appropriate? Yes; Fluid consistency: Thin TPN (CLINIMIX) Adult without lytes  Skin:  Wound (see comment) (Incisions to R flank and abdomen from 7/11  and 7/18)  Last BM:  10/23  Height:   Ht Readings from Last 1 Encounters:  05/26/17 '5\' 10"'  (1.778 m)    Weight:   Wt Readings from Last 1 Encounters:  08/19/17 177 lb 11.1 oz (80.6 kg)    Ideal Body Weight:  75.45 kg  BMI:  Body mass index is 25.5 kg/m.  Estimated Nutritional Needs:   Kcal:  2030-2270 (25-28 kcal/kg)  Protein:  137-153 grams (1.7-1.9 grams/kg)  Fluid:  2 L/day  EDUCATION NEEDS:   No education needs identified at this time    Jarome Matin, MS, RD, LDN, CNSC Inpatient Clinical Dietitian Pager # [phone] After hours/weekend pager # [phone]

## 2017-08-19 NOTE — Progress Notes (Signed)
Patient refused all morning medicines. Ate very little for breakfast. Walked entire circle of unit with physical therapy. States he is tired.

## 2017-08-20 ENCOUNTER — Inpatient Hospital Stay (HOSPITAL_COMMUNITY): Payer: Medicare Other

## 2017-08-20 LAB — COMPREHENSIVE METABOLIC PANEL
ALT: 59 U/L (ref 17–63)
ANION GAP: 8 (ref 5–15)
AST: 57 U/L — ABNORMAL HIGH (ref 15–41)
Albumin: 2.5 g/dL — ABNORMAL LOW (ref 3.5–5.0)
Alkaline Phosphatase: 209 U/L — ABNORMAL HIGH (ref 38–126)
BUN: 14 mg/dL (ref 6–20)
CHLORIDE: 105 mmol/L (ref 101–111)
CO2: 24 mmol/L (ref 22–32)
Calcium: 9.1 mg/dL (ref 8.9–10.3)
Creatinine, Ser: 0.58 mg/dL — ABNORMAL LOW (ref 0.61–1.24)
Glucose, Bld: 120 mg/dL — ABNORMAL HIGH (ref 65–99)
Potassium: 3.9 mmol/L (ref 3.5–5.1)
SODIUM: 137 mmol/L (ref 135–145)
Total Bilirubin: 0.5 mg/dL (ref 0.3–1.2)
Total Protein: 6.4 g/dL — ABNORMAL LOW (ref 6.5–8.1)

## 2017-08-20 LAB — MAGNESIUM: MAGNESIUM: 1.4 mg/dL — AB (ref 1.7–2.4)

## 2017-08-20 LAB — PHOSPHORUS: PHOSPHORUS: 4.6 mg/dL (ref 2.5–4.6)

## 2017-08-20 MED ORDER — MAGNESIUM SULFATE 4 GM/100ML IV SOLN
4.0000 g | Freq: Once | INTRAVENOUS | Status: AC
  Administered 2017-08-20: 4 g via INTRAVENOUS
  Filled 2017-08-20: qty 100

## 2017-08-20 MED ORDER — TRACE MINERALS CR-CU-MN-SE-ZN 10-1000-500-60 MCG/ML IV SOLN
INTRAVENOUS | Status: AC
  Administered 2017-08-20: 18:00:00 via INTRAVENOUS
  Filled 2017-08-20: qty 1000

## 2017-08-20 MED ORDER — MIRTAZAPINE 15 MG PO TBDP
15.0000 mg | ORAL_TABLET | Freq: Every day | ORAL | Status: DC
  Administered 2017-08-20 – 2017-09-03 (×10): 15 mg via ORAL
  Filled 2017-08-20 (×16): qty 1

## 2017-08-20 MED ORDER — METOCLOPRAMIDE HCL 5 MG/ML IJ SOLN
5.0000 mg | Freq: Three times a day (TID) | INTRAMUSCULAR | Status: DC
  Administered 2017-08-20 – 2017-08-24 (×11): 5 mg via INTRAVENOUS
  Filled 2017-08-20 (×12): qty 2

## 2017-08-20 MED ORDER — TRACE MINERALS CR-CU-MN-SE-ZN 10-1000-500-60 MCG/ML IV SOLN
INTRAVENOUS | Status: DC
  Filled 2017-08-20: qty 1000

## 2017-08-20 MED ORDER — ENSURE ENLIVE PO LIQD
237.0000 mL | Freq: Two times a day (BID) | ORAL | Status: DC
  Administered 2017-08-22 – 2017-09-04 (×15): 237 mL via ORAL

## 2017-08-20 MED ORDER — FAMOTIDINE 20 MG PO TABS
40.0000 mg | ORAL_TABLET | Freq: Every day | ORAL | Status: DC
  Administered 2017-08-20 – 2017-09-03 (×10): 40 mg via ORAL
  Filled 2017-08-20 (×12): qty 2

## 2017-08-20 MED ORDER — PRO-STAT SUGAR FREE PO LIQD: 30.0000 mL | Freq: Two times a day (BID) | ORAL | Status: DC

## 2017-08-20 MED ORDER — POTASSIUM CHLORIDE 10 MEQ/50ML IV SOLN
10.0000 meq | INTRAVENOUS | Status: AC
  Administered 2017-08-20 (×4): 10 meq via INTRAVENOUS
  Filled 2017-08-20 (×4): qty 50

## 2017-08-20 MED ORDER — BOOST / RESOURCE BREEZE PO LIQD
1.0000 | Freq: Two times a day (BID) | ORAL | Status: DC
  Administered 2017-08-20 – 2017-09-03 (×20): 1 via ORAL

## 2017-08-20 NOTE — Progress Notes (Addendum)
[place] NOTE   Pharmacy Consult for TPN Indication: prolonged ileus, small bowel leak, multiple bowel surgeries  Patient Measurements: Body mass index is 25.08 kg/m. Filed Weights   08/17/17 0448 08/19/17 0500 08/20/17 0459  Weight: 177 lb 4 oz (80.4 kg) 177 lb 11.1 oz (80.6 kg) 174 lb 13.2 oz (79.3 kg)   HPI: 18 yoM admitted on 7/11 for enlarging adrenal mass and planned adrenalectomy.  Pharmacy consulted to dose TPN.  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   7/22 OR:  wash out, tube ileostomy, left with open abdomen & wound vac in place.  Plan for OR on 7/25 for closure 7/25 OR: mesh placed for abdominal closure, wound vac 8/2 extubated, propofol drip off 8/4 TPN off for ~ 5 hours due to line detached from filter 8/10 MD request decrease rate x 1 week 2nd low sodium 8/12 Additional sodium added to TPN due to persistent hyponatremia 8/16 Increasing rate back to goal since sodium has improved with above intervention.   8/20 Prealbumin decreased.  New apparent enteric leak per surgery notes. 8/21 CaPhos product elevated, remove electrolytes from TPN 8/25 PICC did not have blood return in 2 of the 3 ports.  Alteplase intra-catheter and function returned.  TPN infusing correctly on 8/26 per RN. 8/28: d/c SSI 8/30: added electrolytes to TPN 8/31: TPN IV line came out around ~0340 and current bag was stopped at this time.  Restart new clinimix 5/15 1L bag (no lytes since phos is elevated this morning) until next bag is due at [facility]. Will not add MVI and trace elements to this 1L  bag. Use electrolyte free clinimix with new bag at [facility] 9/1 NGT out 9/3 added electrolytes to TPN 9/4 lytes out of TPN 9/7 cycle over 18 hrs 9/8 add back electrolytes to TPN.  Cycle over 14 hours 9/9 lytes out of TPN, continue 14 hr cycle, DC SSI/CBGs 9/13 electrolytes added back to TPN 9/14 remove electrolytes from TPN 10/3 Adjusted estimated needs based on prealbumin (decreased on 9/24, low but slightly improved on 10/1) and attempt to maximize nutrition prior to surgery planned for 07/31/17 - required increase of cycle to 16 hours. 10/5 to OR for fistula-repair surgery 10/6: Lytes added back to TPN with low Phos 10/10 D/C CBGs 10/14 NGT clamp trial 10/16: CLD ordered.  Per RN notes patient tolerated liquids, ice chips, small amnt jello 10/17: NGT removed 10/19: Diet advanced to Regular diet  10/21: Pharmacy received consult to wean TPN off.   10/22: Clarified with Dr. Rosendo Gros to gradually decrease TPN over next 2-days with plans to stop by mid-week 10/23: Oral intake remains <= 25% of needs. Refer to dietician note for details.  No anti-emetic use today 10/25  Further weaning to 1L TPN per surgery's request in hopes to increase appetite.  Pt reports eating bfast today (egg, french toast, sausage) without nausea, but vomited with pills this morning.  Increase dietary supplements and Prostat and encouraged to take small sips continuously throughout the day.  Pharmacy to continue to replace electrolytes IV.  Insulin requirements past 24 hours: none  Current Nutrition: regular diet started 10/19 Supplements:  Boost  BID, Ensure BID, Prostat BID, Magic Cup daily (dietary orders only)  IVF: none  Central access: PICC line ordered 7/19, placed on 7/20 TPN start date: 7/20  ASSESSMENT                                                                                                           Today, 08/20/17  - Glucose:  (goal 100-150) At goal.  SSI/CBGs discontinued 10/10. -  Electrolytes:  K 3.9  - daily IV replacement  Mag 1.4  - daily IV replacement   Phos 4.6- has been variable, lytes removed from TPN to avoid high Phos levels  Na WNL since adding Na to electrolyte free TPN.  Continue at ~ 0.45% Na equivalence.     Corr Ca 10.3; Ca/phos product 47  (goal < 55) - Renal:  SCr wnl, stable - NGT removed. Using some PRN promethazine, no Zofran for several days, but pt reports emesis after taking PO meds.  Encouraged use of PRNs. - LFTs:  ALT WNL, AST 57 and Alk Phos 209 elevated and increased (10/25) - TGs:  Slightly elevated prior to starting TPN, but now WNL (141 on 10/22) - Prealbumin: Low on admission but trended up and remained at or near goal during Aug to Sep;  (15.6 on 10/15 decreased to 13.3 on 10/22).    NUTRITIONAL GOALS                                                                                             RD recs (10/24):  137-153 gms Protein (1.7 - 1.9 g/kg), 2030 - 2270 Kcal (25-28 kcal/kg)  Recalculation (10/24): Clinimix 5/15 1000 mL/day with lipids 3x/week on MWF only would provide: 50g protein (36% minimum estimated protein need) and 916 kcal (45% minimum estimated kcal need) - Ensure Enlive BID (each: 350 kcal and 20 g protein) - Boost Breeze BID (each: 250 kcal and 9 g protein) - Prostat BID (each: 100 kcal and 15 grams of protein).  Pt refusing d/t texture/taste. - Magic Cup daily (each: 290 kcal and 9 g protein).    PLAN  Now:  KCl 10 mEq IV x 4  Mag 4g IV x 2  Pharmacy to continue IV electrolyte replacement while on TPN and until more consistently tolerating PO intake.  At 1800 today:  Further reduce to Clinimix 5/15 (electrolyte free) 1000 mL/day cycled over 12 hours.  50 ml/hr x 1 hr (6p-7p), 90 ml/hr x 14 hr (7p-5a), 50 ml/hr x 1 hr (5a-6a), then off  Sodium added to Clinimix to keep equivalent to ~  0.225% Na:  adding 19 mEq NaCl and 19 mEq Na Acetate in the 1L TPN bag   Lipids at 15 mls/hr x 12 hours 3 times weekly on MWF TPN to contain standard multivitamins and trace elements.  Continue PO MVI order, but he is currently refusing PO.  Change to Pepcid 40 mg PO (start 10/25)  Pepcid and IV protonix for coffee ground material from NGT in early August.  In October had some blood in NGT.  TPN lab panels on Mondays & Thursdays.   BMET, Mag & Phos daily ordered.  Follow up patient's oral intake. Will continue 1L TPN until enteral intake is meeting >60% nutrition goals or otherwise requested by surgery.  Gretta Arab PharmD, BCPS Pager [phone] 08/20/2017 7:52 AM

## 2017-08-20 NOTE — Progress Notes (Signed)
Physical Therapy Treatment Patient Details Name: [name] [name] MRN: [id number] DOB: February 24, 1961 Today's Date: 08/20/2017    History of Present Illness  56 y.o. male admitted 7/11 with right adrenal mass , s/p  right adrenalectomy complicated by small bowel injury requiring resection with anastomosis . Has required multiple surgeris, the last being 10/5 for  Laparoscopy, laparotomy, extensive enterolysis with removal of mesh (partially) in the right upper quadrant; resection of old anastomosis and ileotransverse anastomosiss    PT Comments    The patient  Patient's HR 141 while ambulating today. Tolerated weight on the left leg but limited distance.  RN aware. Continue PT   Follow Up Recommendations  SNF;Supervision/Assistance - 24 hour;Home health PT     Equipment Recommendations  Rolling walker with 5" wheels    Recommendations for Other Services       Precautions / Restrictions Precautions Precautions: Fall Precaution Comments: abdominal wounds, left knee is buckling  a little Required Braces or Orthoses: Other Brace/Splint Other Brace/Splint: wrap left knee w/ ace wrap    Mobility  Bed Mobility               General bed mobility comments: in recliner  Transfers Overall transfer level: Needs assistance Equipment used: Rolling walker (2 wheeled) Transfers: Sit to/from Stand Sit to Stand: Min guard         General transfer comment: cues for hand placement  Ambulation/Gait Ambulation/Gait assistance: Min assist;+2 safety/equipment Ambulation Distance (Feet): 250 Feet Assistive device: Rolling walker (2 wheeled) Gait Pattern/deviations: Step-to pattern;Step-through pattern;Shuffle;Antalgic Gait velocity: git speed incr. from previous   General Gait Details: tolerated weight bear  throught the left, HR 141 max while ambulating   Stairs            Wheelchair Mobility    Modified Rankin (Stroke Patients Only)       Balance                                             Cognition Arousal/Alertness: Awake/alert                                     General Comments: tearful      Exercises      General Comments        Pertinent Vitals/Pain Faces Pain Scale: Hurts even more Pain Location: left  medial knee, Pain Descriptors / Indicators: Grimacing;Operative site guarding Pain Intervention(s): Repositioned    Home Living                      Prior Function            PT Goals (current goals can now be found in the care plan section) Progress towards PT goals: Progressing toward goals    Frequency           PT Plan Current plan remains appropriate    Co-evaluation              AM-PAC PT "6 Clicks" Daily Activity  Outcome Measure  Difficulty turning over in bed (including adjusting bedclothes, sheets and blankets)?: A Lot Difficulty moving from lying on back to sitting on the side of the bed? : A Lot Difficulty sitting down on and standing up from a chair with arms (e.g., wheelchair,  bedside commode, etc,.)?: A Lot Help needed moving to and from a bed to chair (including a wheelchair)?: A Lot Help needed walking in hospital room?: A Lot Help needed climbing 3-5 steps with a railing? : A Lot 6 Click Score: 12    End of Session Equipment Utilized During Treatment: Gait belt Activity Tolerance: Patient limited by fatigue Patient left: in chair;with call bell/phone within reach;with family/visitor present Nurse Communication: Mobility status PT Visit Diagnosis: Difficulty in walking, not elsewhere classified (R26.2);Muscle weakness (generalized) (M62.81)     Time: 1354-1430 PT Time Calculation (min) (ACUTE ONLY): 36 min  Charges:  $Gait Training: 23-37 mins                    G CodesTresa Endo PT [phone]    [name] 08/20/2017, 3:22 PM

## 2017-08-20 NOTE — Progress Notes (Signed)
Per Dr. Excell Seltzer OK for Steroid injection to left knee if needed.

## 2017-08-20 NOTE — Progress Notes (Signed)
[facility] Progress Note  20 Days Post-Op  Subjective: CC: wants to get home Discussed plans to help patient transition towards home. He is concerned about length of time these measures will take, but willing to work with [name] on it.  UOP good. VSS.   Objective: Vital signs in last 24 hours: Temp:  [98 F (36.7 C)-98.5 F (36.9 C)] 98.4 F (36.9 C) (10/25 0353) Pulse Rate:  [82-96] 85 (10/25 0400) Resp:  [14-21] 14 (10/25 0400) BP: (101-139)/(73-89) 139/80 (10/25 0400) SpO2:  [96 %-99 %] 97 % (10/25 0400) FiO2 (%):  [93 %-99 %] 93 % (10/25 0359) Weight:  [79.3 kg (174 lb 13.2 oz)] 79.3 kg (174 lb 13.2 oz) (10/25 0459) Last BM Date: 08/18/17  Intake/Output from previous day: 10/24 0701 - 10/25 0700 In: 1520 [P.O.:790; I.V.:480; IV Piggyback:250] Out: 1200 [Urine:1200] Intake/Output this shift: No intake/output data recorded.  PE: Gen:  Alert, NAD, pleasant Card:  Regular rate and rhythm Pulm:  Normal effort Abd: Soft, not distended Ext: no erythema/warmth/swelling in left knee Skin: warm and dry, no rashes  Psych: A&Ox3   Lab Results:  No results for input(s): WBC, HGB, HCT, PLT in the last 72 hours. BMET  Recent Labs  08/19/17 0515 08/20/17 0542  NA 139 137  K 3.7 3.9  CL 106 105  CO2 25 24  GLUCOSE 105* 120*  BUN 14 14  CREATININE 0.55* 0.58*  CALCIUM 9.0 9.1   PT/INR No results for input(s): LABPROT, INR in the last 72 hours. CMP     Component Value Date/Time   NA 137 08/20/2017 0542   K 3.9 08/20/2017 0542   CL 105 08/20/2017 0542   CO2 24 08/20/2017 0542   GLUCOSE 120 (H) 08/20/2017 0542   BUN 14 08/20/2017 0542   CREATININE 0.58 (L) 08/20/2017 0542   CREATININE 0.85 07/22/2013 1021   CALCIUM 9.1 08/20/2017 0542   PROT 6.4 (L) 08/20/2017 0542   ALBUMIN 2.5 (L) 08/20/2017 0542   AST 57 (H) 08/20/2017 0542   ALT 59 08/20/2017 0542   ALKPHOS 209 (H) 08/20/2017 0542   BILITOT 0.5 08/20/2017 0542   GFRNONAA >60 08/20/2017 0542   GFRAA >60 08/20/2017 0542   Lipase     Component Value Date/Time   LIPASE 27 09/15/2015 0934       Studies/Results: No results found.  Anti-infectives: Anti-infectives    Start     Dose/Rate Route Frequency Ordered Stop   08/11/17 1400  fluconazole (DIFLUCAN) IVPB 100 mg  Status:  Discontinued     100 mg 50 mL/hr over 60 Minutes Intravenous Every 24 hours 08/11/17 1312 08/16/17 1847   08/01/17 0600  metroNIDAZOLE (FLAGYL) IVPB 500 mg  Status:  Discontinued     500 mg 100 mL/hr over 60 Minutes Intravenous On call to O.R. 07/31/17 1656 07/31/17 1709   08/01/17 0600  ciprofloxacin (CIPRO) IVPB 400 mg  Status:  Discontinued     400 mg 200 mL/hr over 60 Minutes Intravenous On call to O.R. 07/31/17 1656 07/31/17 1709   07/31/17 0556  metroNIDAZOLE (FLAGYL) IVPB 500 mg     500 mg 100 mL/hr over 60 Minutes Intravenous On call to O.R. 07/31/17 0556 07/31/17 0840   07/31/17 0556  ciprofloxacin (CIPRO) IVPB 400 mg     400 mg 200 mL/hr over 60 Minutes Intravenous On call to O.R. 07/31/17 0556 07/31/17 0914   07/08/17 1600  fluconazole (DIFLUCAN) IVPB 100 mg     100 mg 50 mL/hr  over 60 Minutes Intravenous Every 24 hours 07/08/17 0828 07/14/17 1710   07/01/17 1500  fluconazole (DIFLUCAN) IVPB 100 mg     100 mg 50 mL/hr over 60 Minutes Intravenous Every 24 hours 07/01/17 1401 07/07/17 1715   06/16/17 1200  piperacillin-tazobactam (ZOSYN) IVPB 3.375 g     3.375 g 12.5 mL/hr over 240 Minutes Intravenous Every 8 hours 06/16/17 1026 06/17/17 0004   06/08/17 1200  piperacillin-tazobactam (ZOSYN) IVPB 3.375 g  Status:  Discontinued     3.375 g 12.5 mL/hr over 240 Minutes Intravenous Every 8 hours 06/08/17 1056 06/16/17 1026   05/29/17 2200  ceFEPIme (MAXIPIME) 2 g in dextrose 5 % 50 mL IVPB  Status:  Discontinued     2 g 100 mL/hr over 30 Minutes Intravenous Every 8 hours 05/29/17 2031 06/03/17 0832   05/29/17 2100  metroNIDAZOLE (FLAGYL) IVPB 500 mg  Status:  Discontinued     500 mg 100  mL/hr over 60 Minutes Intravenous Every 8 hours 05/29/17 2029 06/03/17 0832   05/26/17 2200  ceFAZolin (ANCEF) IVPB 1 g/50 mL premix  Status:  Discontinued     1 g 100 mL/hr over 30 Minutes Intravenous Every 8 hours 05/26/17 1008 05/29/17 2023   05/25/17 1100  vancomycin (VANCOCIN) 1,250 mg in sodium chloride 0.9 % 250 mL IVPB  Status:  Discontinued     1,250 mg 166.7 mL/hr over 90 Minutes Intravenous Every 12 hours 05/25/17 0955 05/26/17 0946   05/22/17 1400  anidulafungin (ERAXIS) 100 mg in sodium chloride 0.9 % 100 mL IVPB     100 mg 78 mL/hr over 100 Minutes Intravenous Every 24 hours 05/21/17 1330 05/27/17 1740   05/21/17 1400  anidulafungin (ERAXIS) 200 mg in sodium chloride 0.9 % 200 mL IVPB     200 mg 78 mL/hr over 200 Minutes Intravenous  Once 05/21/17 1330 05/21/17 1940   05/15/17 1000  anidulafungin (ERAXIS) 100 mg in sodium chloride 0.9 % 100 mL IVPB  Status:  Discontinued     100 mg 78 mL/hr over 100 Minutes Intravenous Every 24 hours 05/14/17 0829 05/16/17 0901   05/15/17 1000  metroNIDAZOLE (FLAGYL) IVPB 500 mg  Status:  Discontinued     500 mg 100 mL/hr over 60 Minutes Intravenous Every 8 hours 05/15/17 0903 05/25/17 0938   05/14/17 0900  anidulafungin (ERAXIS) 200 mg in sodium chloride 0.9 % 200 mL IVPB     200 mg 78 mL/hr over 200 Minutes Intravenous  Once 05/14/17 0829 05/14/17 1325   05/13/17 1927  vancomycin (VANCOCIN) 1-5 GM/200ML-% IVPB    Comments:  Ward, Christa   : cabinet override      05/13/17 1927 05/14/17 0744   05/12/17 1400  ceFEPIme (MAXIPIME) 1 g in dextrose 5 % 50 mL IVPB     1 g 100 mL/hr over 30 Minutes Intravenous Every 8 hours 05/12/17 0939 05/26/17 2359   05/12/17 0900  vancomycin (VANCOCIN) IVPB 1000 mg/200 mL premix  Status:  Discontinued     1,000 mg 200 mL/hr over 60 Minutes Intravenous Every 12 hours 05/12/17 0750 05/16/17 0852   05/12/17 0830  aztreonam (AZACTAM) 2 GM IVPB     2 g 100 mL/hr over 30 Minutes Intravenous  Once 05/12/17 0800  05/12/17 0957   05/06/17 0916  vancomycin (VANCOCIN) IVPB 1000 mg/200 mL premix     1,000 mg 200 mL/hr over 60 Minutes Intravenous On call to O.R. 05/06/17 0916 05/06/17 1229       Assessment/Plan Acute respiratory  failure/ post op HCAP pneumonia/post op pleural effusion - CCM/extubated 05/28/17 Malnutrition - PICC/TNA 05/14/17; albumin 2.3 and preablumin 13.3 on 10/22 Oral thrush  Hx of hypertension Hx of anemia Hx of anxiety Hx of hypothyroid Chronic pain medicine use - neck pain - Pt being followed by Palliative to assist with pain control. Tobacco use  Adrenal mass/hx of prior hemicolectomy/ileocolic anastomosis 1191 1. S/p laparoscopic converted to open right adrenalectomy, small bowel resection with anastomosis, placement of 20cm^2 wound vac, 05/06/17, Dr. Lurena Joiner [name] 2. Post op wound dehiscence - primary repair of small bowel anastomotic leak, abdominal closure of wound dehisence, 05/13/17, Luke [name] 3. S/p reopening of recent laparotomy, lysis of adhesions, small bowel resection, placement 200cm^2 negative pressure dressing, 05/14/17, Luke [name] 4. S/p reopening of recent laparotomy, creation of tube ileostomy, placement of 100cm2 negative pressure dressing, 05/17/17, Luke [name] 5. S/p abdominal wash out, placement of mesh for abdominal closure, placement of wound vac of 100cm^2, 05/20/17, Luke [name] 6. S/p Bronchoscopy 05/22/17 Dr. Simonne Maffucci EC fistula 06/13/17 - noted on exam  7. Laparoscopy, laparotomy, extensive enterolysis with removal of mesh (partially) in the right upper quadrant; resection of old anastomosis and ileotransverse anastomosis, 07/31/17,Dr.Matthew Hassell Done  - continue dressing changes - continue therapies - encourage PO intake, gradual weaning of TPN - will consult palliative care for assistance in weaning off IV pain medication  Left Knee pain - Have consulted Dr. Mayer Camel who has seen the patient in the past - no swelling,  redness or signs of joint infection - heat/ice prn  FEN: Regular diet 08/14/17/cyclic TPN - started 47/8/29 - pharmacy adjusting electrolytes/Phos ID: Vancomycin 7/11- 05/25/17, Maixpime 7/17-7/31/18, Eraxis 7/19-05/27/17, Diflucan 9/5-9/14,&10/16-10/21/18; Cipro/ Flagyl pre-op 07/31/17 DVT: Lovenox, SCDs Foley: Out Follow up: Hassell Done  Plan: Discussed gradual weaning off TPN to try to stimulate appetite with pharmacy. Palliative consult to try to wean off IV pain medication. Ortho to see left knee. Continue therapies.   LOS: 106 days    Brigid Re , [facility] 08/20/2017, 8:38 AM Pager: [phone] Consults: [phone] Mon-Fri 7:00 am-4:30 pm Sat-Sun 7:00 am-11:30 am

## 2017-08-20 NOTE — Consult Note (Signed)
Reason for Consult: Left Knee Pain  Referring Physician: Claiborne Billings Rayburn PA-C  [name] [name] is an 57 y.o. male.   HPI: 56 y.o. male admitted 7/11 with right adrenal mass , s/p  right adrenalectomy complicated by small bowel injury requiring resection with anastomosis . Has required multiple surgeris, the last being 10/5 for  Laparoscopy, laparotomy, extensive enterolysis with removal of mesh (partially) in the right upper quadrant; resection of old anastomosis and ileotransverse anastomosiss.  Pt now complains of left knee pain that is limiting physical therapy.  He has previously been seen by Dr. Mayer Camel with history of arthroscopy several years prior.  No recent injury.  Past Medical History:  Diagnosis Date  . Anemia   . Anxiety   . Arthritis    neck, shoulder, left knee  . 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 Erythrocytosis 11/30/2014  . Essential hypertension    under control with med., has been on med. x 1 yr.  Marland Kitchen GERD (gastroesophageal reflux disease)   . Headache(784.0)    2 x/week  . History of kidney stones   . Hypothyroidism    hx of   . Leukocytosis, unspecified 11/03/2013  . Limited joint range of motion    cervical fusion 02/2013  . Medial meniscus tear 06/2013   left knee  . Midsternal chest pain 03/05/2015  . 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 Thyroid nodule   . Tobacco abuse   . Wears partial dentures    upper    Past Surgical History:  Procedure Laterality Date  . ANTERIOR CERVICAL DECOMP/DISCECTOMY FUSION N/A 03/18/2013   Procedure: ANTERIOR CERVICAL DECOMPRESSION/DISCECTOMY FUSION 3 LEVELS;  Surgeon: Erline Levine, MD;  Location: [place] [facility];  Service: Neurosurgery;  Laterality: N/A;  Anterior Cervical Three-Six  Decompression/Diskectomy/Fusion  . APPENDECTOMY    . CIRCUMCISION  05/04/2006  . COLONOSCOPY    . EYE SURGERY     bilateral cataract surgery   . HEMICOLECTOMY Right 7/35/3299   with ileocolic anastomosis  .  INGUINAL HERNIA REPAIR    . INSERTION OF MESH N/A 07/31/2017   Procedure: INSERTION OF MESH;  Surgeon: Johnathan Hausen, MD;  Location: [facility];  Service: General;  Laterality: N/A;  . KNEE ARTHROSCOPY Left 08/01/2013   Procedure: LEFT ARTHROSCOPY KNEE;  Surgeon: Kerin Salen, MD;  Location: [place];  Service: Orthopedics;  Laterality: Left;  . LAPAROSCOPIC ADRENALECTOMY Right 05/06/2017   Procedure: OPEN RIGHT ADRENALECTOMY WITH SMALL BOWEL RESECTION AND ANASTOMOSIS;  Surgeon: Kieth Brightly, Arta Bruce, MD;  Location: [facility];  Service: General;  Laterality: Right;  . LAPAROSCOPIC LYSIS OF ADHESIONS N/A 07/31/2017   Procedure: LYSIS OF ADHESIONS;  Surgeon: Johnathan Hausen, MD;  Location: [facility];  Service: General;  Laterality: N/A;  . LAPAROSCOPY N/A 07/31/2017   Procedure: LAPAROSCOPY DIAGNOSTIC;  Surgeon: Johnathan Hausen, MD;  Location: [facility];  Service: General;  Laterality: N/A;  . LAPAROTOMY N/A 05/13/2017   Procedure: EXPLORATORY LAPAROTOMY, with wound dehisense, and repair of anastamotic leak;  Surgeon: Mickeal Skinner, MD;  Location: [facility];  Service: General;  Laterality: N/A;  . LAPAROTOMY N/A 05/14/2017   Procedure: EXPLORATORY LAPAROTOMY, LYSIS OF ADHESIONS/ SMALL BOWEL RESECTION/ WASHOUT OF ABDOMEN AND PLACEMENT OF NEGATIVE PRESSURE DRESSING;  Surgeon: Kieth Brightly, Arta Bruce, MD;  Location: [facility];  Service: General;  Laterality: N/A;  . LAPAROTOMY N/A 05/17/2017   Procedure: EXPLORATORY LAPAROTOMY, PLACEMENT OF ILEOSTOMY TUBES, PARTIAL CLOSURE OF FASCIA, WOUND VAC EXCHANGE;  Surgeon:  [name], Arta Bruce, MD;  Location: [facility];  Service: General;  Laterality: N/A;  . LAPAROTOMY N/A 05/20/2017   Procedure: ABDOMINAL WOUND [place] OUT, WOUND CLOSURE AND WOUND VAC PLACEMENT;  Surgeon: [name], Arta Bruce, MD;  Location: [facility];  Service: General;  Laterality: N/A;  . LAPAROTOMY N/A 07/31/2017   Procedure: EXPLORATORY LAPAROTOMY WITH CLOSURE OF ENTEROTOMICS;  Surgeon: Johnathan Hausen, MD;  Location: [facility];  Service: General;  Laterality: N/A;  . SHOULDER ARTHROSCOPY W/ ROTATOR CUFF REPAIR Left 07/16/2010  . SMALL INTESTINE SURGERY  04/24/2000   adhesiolysis, ileocolic anastomosis  . UPPER GASTROINTESTINAL ENDOSCOPY      Family History  Problem Relation Age of Onset  . Cancer Father 73       colon ca - deceased.  . Colon cancer Father   . Cancer Sister 56       breast ca  . Thyroid disease Neg Hx     Social History:  reports that he has been smoking Cigarettes.  He has a 25.00 pack-year smoking history. He has never used smokeless tobacco. He reports that he drinks alcohol. He reports that he does not use drugs.  Allergies:  Allergies  Allergen Reactions  . Chantix [Varenicline] Palpitations    Heart race Increased panic attacks  . Morphine Itching  . Penicillins Rash    Has patient had a PCN reaction causing immediate rash, facial/tongue/throat swelling, SOB or lightheadedness with hypotension: Yes Has patient had a PCN reaction causing severe rash involving mucus membranes or skin necrosis: Unknown Has patient had a PCN reaction that required hospitalization: No Has patient had a PCN reaction occurring within the last 10 years: No If all of the above answers are "NO", then may proceed with Cephalosporin use. Tolerating Zosyn 05/2017     Medications: I have reviewed the patient's current medications.  Results for orders placed or performed during the hospital encounter of 05/06/17 (from the past 48 hour(s))  Magnesium     Status: None   Collection Time: 08/19/17  5:15 AM  Result Value Ref Range   Magnesium 2.3 1.7 - 2.4 mg/dL  Phosphorus     Status: Abnormal   Collection Time: 08/19/17  5:15 AM  Result Value Ref Range   Phosphorus 4.7 (H) 2.5 - 4.6 mg/dL  Basic metabolic panel     Status: Abnormal   Collection Time: 08/19/17  5:15 AM  Result Value Ref Range   Sodium 139 135 - 145 mmol/L   Potassium 3.7 3.5 - 5.1 mmol/L   Chloride 106 101  - 111 mmol/L   CO2 25 22 - 32 mmol/L   Glucose, Bld 105 (H) 65 - 99 mg/dL   BUN 14 6 - 20 mg/dL   Creatinine, Ser 0.55 (L) 0.61 - 1.24 mg/dL   Calcium 9.0 8.9 - 10.3 mg/dL   GFR calc non Af Amer >60 >60 mL/min   GFR calc Af Amer >60 >60 mL/min    Comment: (NOTE) The eGFR has been calculated using the CKD EPI equation. This calculation has not been validated in all clinical situations. eGFR's persistently <60 mL/min signify possible Chronic Kidney Disease.    Anion gap 8 5 - 15  Comprehensive metabolic panel     Status: Abnormal   Collection Time: 08/20/17  5:42 AM  Result Value Ref Range   Sodium 137 135 - 145 mmol/L   Potassium 3.9 3.5 - 5.1 mmol/L   Chloride 105 101 - 111 mmol/L   CO2 24 22 -  32 mmol/L   Glucose, Bld 120 (H) 65 - 99 mg/dL   BUN 14 6 - 20 mg/dL   Creatinine, Ser 0.58 (L) 0.61 - 1.24 mg/dL   Calcium 9.1 8.9 - 10.3 mg/dL   Total Protein 6.4 (L) 6.5 - 8.1 g/dL   Albumin 2.5 (L) 3.5 - 5.0 g/dL   AST 57 (H) 15 - 41 U/L   ALT 59 17 - 63 U/L   Alkaline Phosphatase 209 (H) 38 - 126 U/L   Total Bilirubin 0.5 0.3 - 1.2 mg/dL   GFR calc non Af Amer >60 >60 mL/min   GFR calc Af Amer >60 >60 mL/min    Comment: (NOTE) The eGFR has been calculated using the CKD EPI equation. This calculation has not been validated in all clinical situations. eGFR's persistently <60 mL/min signify possible Chronic Kidney Disease.    Anion gap 8 5 - 15  Magnesium     Status: Abnormal   Collection Time: 08/20/17  5:42 AM  Result Value Ref Range   Magnesium 1.4 (L) 1.7 - 2.4 mg/dL  Phosphorus     Status: None   Collection Time: 08/20/17  5:42 AM  Result Value Ref Range   Phosphorus 4.6 2.5 - 4.6 mg/dL    No results found.  Review of Systems  Constitutional: Positive for malaise/fatigue.  Gastrointestinal: Positive for abdominal pain.  Musculoskeletal: Positive for joint pain.  Neurological: Positive for weakness.  Psychiatric/Behavioral: Positive for depression.   Blood  pressure (!) 138/92, pulse (!) 104, temperature 98.7 F (37.1 C), temperature source Oral, resp. rate (!) 21, height _0  (1.778 m), weight 79.3 kg (174 lb 13.2 oz), SpO2 97 %. Physical Exam  Constitutional: He is oriented to person, place, and time. He appears well-developed and well-nourished.  Neck: Normal range of motion. Neck supple.  Cardiovascular: Intact distal pulses.   Respiratory: Effort normal.  Musculoskeletal: Normal range of motion. He exhibits tenderness.  Normal ROM.  Tenderness over anteromedial joint line.  No effusion.  Pop/clunck with mcmurray's test.  Calves are soft and nontender.  Neurological: He is alert and oriented to person, place, and time.  Skin: Skin is warm and dry.  Psychiatric: He has a normal mood and affect. His behavior is normal. Judgment and thought content normal.    Assessment/Plan: Left Knee pain  Plan to day is to get xray of left knee.  If no acute findings, pt may benefit from steroid injection if not contraindicated from a medicine stand point.  Pt may follow up at any point in the outpatient setting once he is discharged.    Sarabella Caprio R 08/20/2017, 12:34 PM

## 2017-08-20 NOTE — Progress Notes (Addendum)
NUTRITION NOTE  Pt seen for full follow-up by this RD yesterday with associated note at 11:56AM. Spoke with Pharmacist this AM and she states that Surgery team has requested further wean of TPN (to 1 L/day). This + current ILE regimen will provide a daily average of 50 grams of protein (36% minimum estimated protein need) and 916 kcal (45% minimum estimated kcal need). Pt is currently ordered Ensure Enlive once/day (350 kcal and 20 grams of protein) and Boost Breeze once/day (250 kcal and 9 grams of protein). Plan to increase both of these to BID and order Prostat BID (each packet provides 100 kcal and 15 grams of protein). Pt enjoys Ensure Enlive mixed with ice cream; mix Prostat with drink of choice. Will also order Magic Cup for all dinner trays (290 kcal and 9 grams of protein). Will continue to follow per protocol and continue to assist as able to optimize nutrition.     Estimated Nutritional Needs:  Kcal:  2030-2270 (25-28 kcal/kg) Protein:  137-153 grams (1.7-1.9 grams/kg) Fluid:  2 L/day    Jarome Matin, MS, RD, LDN, [facility] Inpatient Clinical Dietitian Pager # [phone] After hours/weekend pager # [phone]

## 2017-08-21 LAB — PHOSPHORUS: Phosphorus: 6.3 mg/dL — ABNORMAL HIGH (ref 2.5–4.6)

## 2017-08-21 LAB — BASIC METABOLIC PANEL
Anion gap: 11 (ref 5–15)
BUN: 14 mg/dL (ref 6–20)
CALCIUM: 9.6 mg/dL (ref 8.9–10.3)
CO2: 23 mmol/L (ref 22–32)
CREATININE: 0.64 mg/dL (ref 0.61–1.24)
Chloride: 101 mmol/L (ref 101–111)
GFR calc non Af Amer: >60 mL/min (ref >60)
Glucose, Bld: 113 mg/dL — ABNORMAL HIGH (ref 65–99)
Potassium: 3.7 mmol/L (ref 3.5–5.1)
SODIUM: 135 mmol/L (ref 135–145)

## 2017-08-21 LAB — MAGNESIUM: MAGNESIUM: 2 mg/dL (ref 1.7–2.4)

## 2017-08-21 MED ORDER — TRACE MINERALS CR-CU-MN-SE-ZN 10-1000-500-60 MCG/ML IV SOLN
INTRAVENOUS | Status: AC
  Administered 2017-08-21: 18:00:00 via INTRAVENOUS
  Filled 2017-08-21 (×2): qty 1000

## 2017-08-21 MED ORDER — MAGNESIUM SULFATE 4 GM/100ML IV SOLN
4.0000 g | Freq: Once | INTRAVENOUS | Status: AC
  Administered 2017-08-21: 4 g via INTRAVENOUS
  Filled 2017-08-21: qty 100

## 2017-08-21 MED ORDER — DICLOFENAC SODIUM 1 % TD GEL
4.0000 g | Freq: Four times a day (QID) | TRANSDERMAL | Status: DC
  Administered 2017-08-21 – 2017-09-03 (×31): 4 g via TOPICAL
  Filled 2017-08-21: qty 100

## 2017-08-21 MED ORDER — PROMETHAZINE HCL 25 MG PO TABS
12.5000 mg | ORAL_TABLET | Freq: Three times a day (TID) | ORAL | Status: DC
  Administered 2017-08-21 – 2017-09-04 (×28): 12.5 mg via ORAL
  Filled 2017-08-21 (×30): qty 1

## 2017-08-21 MED ORDER — LORAZEPAM 1 MG PO TABS
1.0000 mg | ORAL_TABLET | ORAL | Status: DC | PRN
  Administered 2017-08-21: 1 mg via ORAL
  Filled 2017-08-21: qty 1

## 2017-08-21 MED ORDER — LORAZEPAM 2 MG/ML IJ SOLN
1.0000 mg | Freq: Four times a day (QID) | INTRAMUSCULAR | Status: DC | PRN
  Administered 2017-08-21 – 2017-08-28 (×10): 1 mg via INTRAVENOUS
  Filled 2017-08-21 (×10): qty 1

## 2017-08-21 MED ORDER — FENTANYL 40 MCG/ML IV SOLN
INTRAVENOUS | Status: DC
  Filled 2017-08-21: qty 25

## 2017-08-21 MED ORDER — FENTANYL 40 MCG/ML IV SOLN
INTRAVENOUS | Status: DC
  Administered 2017-08-21 (×2): 0 ug via INTRAVENOUS
  Administered 2017-08-21: 50 ug via INTRAVENOUS
  Administered 2017-08-22: 0 ug via INTRAVENOUS
  Administered 2017-08-22: 12.5 ug via INTRAVENOUS
  Administered 2017-08-22: 0 ug via INTRAVENOUS
  Administered 2017-08-22: 12.5 ug via INTRAVENOUS
  Administered 2017-08-22: 37.5 ug via INTRAVENOUS
  Filled 2017-08-21: qty 25

## 2017-08-21 MED ORDER — FAT EMULSION 20 % IV EMUL
120.0000 mL | INTRAVENOUS | Status: AC
  Administered 2017-08-21: 120 mL via INTRAVENOUS
  Filled 2017-08-21: qty 250

## 2017-08-21 MED ORDER — POTASSIUM CHLORIDE 10 MEQ/50ML IV SOLN
10.0000 meq | INTRAVENOUS | Status: AC
  Administered 2017-08-21 (×4): 10 meq via INTRAVENOUS
  Filled 2017-08-21 (×4): qty 50

## 2017-08-21 MED ORDER — OXYCODONE-ACETAMINOPHEN 5-325 MG PO TABS
2.0000 | ORAL_TABLET | Freq: Three times a day (TID) | ORAL | Status: DC
  Administered 2017-08-21 – 2017-08-31 (×12): 2 via ORAL
  Filled 2017-08-21 (×18): qty 2

## 2017-08-21 NOTE — Progress Notes (Addendum)
Palliative Care Pain Management  Received call from [facility]CS PA re: opioid weaning. His basal rate has already been stopped, but he is still using 1600 mcg of fentanyl via pump in 24 hours. His pain scores are mostly the same not significantly worse. He isn't taking oxycodone or an oral pain medications. Nausea has been an issue with any PO medications. Agree with mirtazapine- I had given him Xyprexa for nausea but in general he would not take this in the evenings. Will reduce his PCA dose of Fentanyl to 69mcg today. Hi major issues are mostly anxiety and anticipatory anxiety re: pain. He has pre-existing chronic pain and is followed by Dr. Maryjean Ka office. I will touch base with Dr. Maryjean Ka and discuss a pain medication transition plan for discharge.   Until he is reliably taking PO medication it will be extremely difficult to get him off IV pain medications and to transition him to long acting benzodiazepines.  Lane Hacker, DO Palliative Medicine'

## 2017-08-21 NOTE — Progress Notes (Signed)
Late entry- Mr [name] [name] c/o that pill "stuck" in throat after taking multivitamin.  In no distress, applesauce given to pt and sips of drink, then Ativan IV given.  Symptoms subsided, has concerns about taking other pills

## 2017-08-21 NOTE — Progress Notes (Signed)
[facility] NOTE   Pharmacy Consult for TPN Indication: prolonged ileus, small bowel leak, multiple bowel surgeries  Patient Measurements: Body mass index is 24.2 kg/m. Filed Weights   08/19/17 0500 08/20/17 0459 08/21/17 0403  Weight: 177 lb 11.1 oz (80.6 kg) 174 lb 13.2 oz (79.3 kg) 168 lb 10.4 oz (76.5 kg)   HPI: 89 yoM admitted on 7/11 for enlarging adrenal mass and planned adrenalectomy.  Pharmacy consulted to dose TPN.  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   7/22 OR:  wash out, tube ileostomy, left with open abdomen & wound vac in place.  Plan for OR on 7/25 for closure 7/25 OR: mesh placed for abdominal closure, wound vac 8/2 extubated, propofol drip off 8/4 TPN off for ~ 5 hours due to line detached from filter 8/10 MD request decrease rate x 1 week 2nd low sodium 8/12 Additional sodium added to TPN due to persistent hyponatremia 8/16 Increasing rate back to goal since sodium has improved with above intervention.   8/20 Prealbumin decreased.  New apparent enteric leak per surgery notes. 8/21 CaPhos product elevated, remove electrolytes from TPN 8/25 PICC did not have blood return in 2 of the 3 ports.  Alteplase intra-catheter and function returned.  TPN infusing correctly on 8/26 per RN. 8/28: d/c SSI 8/30: added electrolytes to TPN 8/31: TPN IV line came out around ~0340 and current bag was stopped at this time.  Restart new clinimix 5/15 1L bag (no lytes since phos is elevated this morning) until next bag is due at [facility]. Will not add MVI and trace elements to this 1L  bag. Use electrolyte free clinimix with new bag at [facility] 9/1 NGT out 9/3 added electrolytes to TPN 9/4 lytes out of TPN 9/7 cycle over 18 hrs 9/8 add back electrolytes to TPN.  Cycle over 14 hours 9/9 lytes out of TPN, continue 14 hr cycle, DC SSI/CBGs 9/13 electrolytes added back to TPN 9/14 remove electrolytes from TPN 10/3 Adjusted estimated needs based on prealbumin (decreased on 9/24, low but slightly improved on 10/1) and attempt to maximize nutrition prior to surgery planned for 07/31/17 - required increase of cycle to 16 hours. 10/5 to OR for fistula-repair surgery 10/6: Lytes added back to TPN with low Phos 10/10 D/C CBGs 10/14 NGT clamp trial 10/16: CLD ordered.  Per RN notes patient tolerated liquids, ice chips, small amnt jello 10/17: NGT removed 10/19: Diet advanced to Regular diet  10/21: Pharmacy received consult to wean TPN off.   10/22: Clarified with Dr. Rosendo Gros to gradually decrease TPN over next 2-days with plans to stop by mid-week 10/23: Oral intake remains <= 25% of needs. Refer to dietician note for details.  No anti-emetic use today 10/25  Further weaning to 1L TPN per surgery's request in hopes to increase appetite.  Pt reports eating bfast today (egg, french toast, sausage) without nausea, but vomited with pills this morning.  Increase dietary supplements and Prostat and encouraged to take small sips continuously throughout the day.  Pharmacy to continue to replace electrolytes IV. 10/26 Patient with better appetite this am, ate all oatmeal and [place] sausage!  No complaints other  than feeling full.  Took oral meds  Insulin requirements past 24 hours: none  Current Nutrition: regular diet started 10/19 Supplements:  Boost BID, Ensure BID, Prostat BID, Magic Cup daily (dietary orders only)  IVF: none  Central access: PICC line ordered 7/19, placed on 7/20 TPN start date: 7/20  ASSESSMENT                                                                                                            Today, 08/21/17  - Glucose:  (goal 100-150) At goal.  SSI/CBGs discontinued 10/10. - Electrolytes:  K 3.7  - daily IV replacement  Mag 2.0  - daily IV replacement   Phos 6.3- has been variable, lytes removed from TPN to avoid high Phos levels  Na WNL since adding Na to electrolyte free TPN.  Continue at ~ 0.225% Na equivalence.     Corr Ca 10.8; Ca/phos product 68  (goal < 55) - Renal:  SCr wnl, stable - NGT removed. Using some PRN promethazine, no Zofran for several days, but pt reports emesis after taking PO meds.  Encouraged use of PRNs. - LFTs:  ALT WNL, AST 57 and Alk Phos 209 elevated and increased (10/25) - TGs:  Slightly elevated prior to starting TPN, but now WNL (141 on 10/22) - Prealbumin: Low on admission but trended up and remained at or near goal during Aug to Sep;  (15.6 on 10/15 decreased to 13.3 on 10/22).    NUTRITIONAL GOALS                                                                                             RD recs (10/24):  137-153 gms Protein (1.7 - 1.9 g/kg), 2030 - 2270 Kcal (25-28 kcal/kg)  Recalculation (10/24): Clinimix 5/15 1000 mL/day with lipids 3x/week on MWF only would provide: 50g protein (36% minimum estimated protein need) and 870 kcal (43% minimum estimated kcal need) - Ensure Enlive BID (each: 350 kcal and 20 g protein) - Boost Breeze BID (each: 250 kcal and 9 g protein) - Prostat BID (each: 100 kcal and 15 grams of protein).  Pt refusing d/t texture/taste. - Magic Cup daily (each: 290 kcal and 9 g protein).    PLAN  Now:  KCl 10 mEq IV x 4  Mag 4g IV x 1  Pharmacy to continue IV electrolyte replacement while on TPN and until more consistently tolerating PO intake.  At 1800 today:  Continue TPN Clinimix 5/15 (electrolyte free) 1000 mL/day cycled over 12 hours.  50 ml/hr x 1 hr  (6p-7p), 90 ml/hr x 10hr (7p-5a), 50 ml/hr x 1 hr (5a-6a), then off  Sodium added to Clinimix to keep equivalent to ~ 0.225% Na:  adding 19 mEq NaCl and 19 mEq Na Acetate in the 1L TPN bag   Lipids at 10 mls/hr x 12 hours 3 times weekly on MWF TPN to contain standard multivitamins and trace elements.  Continue PO MVI order, but he is currently refusing PO.  TPN lab panels on Mondays & Thursdays.   BMET, Mag & Phos daily ordered.  Follow up patient's oral intake. Will continue 1L TPN until enteral intake is meeting >60% nutrition goals or otherwise requested by surgery.  Doreene Eland, PharmD, BCPS.   Pager: [phone] 08/21/2017 7:42 AM

## 2017-08-21 NOTE — Progress Notes (Signed)
New order: Rate change for Fentanyl PCA dose. PCA dose has changed from 75 mcg to 25 mcg. Adjustment made by both Bertram Millard, RN and [place], Therapist, sports.

## 2017-08-21 NOTE — Progress Notes (Signed)
At change of shift, wife informed RN's that it seemed the PICC line had "slide out some".  RN placed order for IV team to come and assess PICC.  After IV RN's visit, she advised bedside nurse to page MD for X-ray orders. Per Gross, MD fluids are to stop and may start again pending X-ray results. If PICC is in right place then resume fluids and pass along in AM report for further evaluation in the morning.  Fluids are resumed. PICC over central SVC. Will continue to monitor patient.

## 2017-08-21 NOTE — Progress Notes (Signed)
Nutrition Follow-up  DOCUMENTATION CODES:   Not applicable  INTERVENTION:  - Continue Ensure Enlive BID, Boost Breeze BID, Prostat BID, and Magic Cup on dinner trays. - Continue to encourage PO intakes of meals and oral supplements. - Continue TPN/TPN wean per Pharmacy and Surgical team.   NUTRITION DIAGNOSIS:   Inadequate oral intake related to poor appetite as evidenced by per patient/family report, meal completion < 25%. -improving.   GOAL:   Patient will meet greater than or equal to 90% of their needs -unmet at this time.   MONITOR:   PO intake, Supplement acceptance, Weight trends, Labs, Skin, I & O's, Other (Comment) (TPN regimen)  ASSESSMENT:   56 y.o. 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 On 7/18, he developed a leak therefore was taken back to OR for re-do of ex-lap and repair of leak.  7/11 FU:XNATF adrenalectomy with complication of intestinal injury and small bowel resection with anastomosis  7/18 TD:DUKGUR of small bowel anastomotic leak, abdominal closure of wound dehisence 7/19 OR: reopening of recent laparotomy, lysis of adhesions, noted ischemic perforation of new loop of intestine, intact repair of previous anastomotic leak repair, intact previous anastomosis.  7/22 OR: wash out, tube ileostomy, left with open abdomen &wound vac in place. Plan for OR on 7/25 for closure 7/25 OR: mesh placed for abdominal closure, wound vac 8/2extubated, propofol drip off 8/4TPN off for ~ 5 hours due to line detached from filter. Ultrasound-guided aspiration By interventional radiology of small perihepatic fluid collection yielded only 1 mL of the fluid, sent for culture. NPO - mild coffee ground on NG but no active bleed 8/6patient improving. Off Precedex drip since 8/4. Dilaudid  Drip being changed over to PCA. Still with some coffee ground-looking material in NG tube.  8/10 hyponatremia and plan to decrease TPN from 3L/day to 2L/day x1 week 8/42found to have fecal matter in wound vac (EC fistula) 8/14increase in fecal matter presence in wound vac with no plan for surgery at this time 8/16TPN advanced to goal rate of 115 ml/hr with 20% ILE @ 20 mL/hr x12 hours on MWF 8/22: NGT clamped. RD to order Boost Breeze for patient to sip on; Mycostatin started for oral thrush. 9/1:pulled NGT and no plans for replacement. 9/1:developed N/V and only permitted sips of clears, if tolerated. 9/4:NGT re-insertion attempted several times without success 9/7:begin cycling TPN 9/23: allowed to leave the floor to go outside 10/5:Laparoscopy, laparotomy, extensive enterolysis with removal of mesh (partially) in the right upper quadrant; resection of old anastomosis and ileotransverse anastomosis 10/5:NGT placement during surgery 10/15:development of recurrent oral thrush 10/17:NGT removed at 10:45 AM 42/70:WCBJSEGB cyclic TPN volume from 3L/day to 2L/day 10/22-10/24:Calorie Count   10/26 Pt sleeping at this time with no family/visitors present. Breakfast tray still on bedside table with 100% completion (grits, [place] sausage, milk: 453 kcal and 20 grams of protein). RN note from shortly after midnight statues that PICC had come out part way and that it was assessed/fixed by IV team. Pt with triple lumen PICC and TPN regimen that is now infusing is outlined below. Per chart review, weight -6 lbs/2.8 kg from yesterday.   Medications reviewed; 40 mg oral Pepcid/day, 4 g IV Mg sulfate x1 run yesterday and x1 run today, 5 mg IV Reglan TID, daily multivitamin with minerals, 10 mEq IV KCl x4  runs yesterday and x4 runs today, 12.5 mg oral Phenergan TID, 1 packet Metamucil/day. Labs reviewed; Phos: 6.3 mg/dL.   10/25 - Spoke with Pharmacist this AM and she states that Surgery  team has requested further wean of TPN (to 1 L/day). - This + current ILE regimen will provide a daily average of 50 grams of protein (36% minimum estimated protein need) and 916 kcal (45% minimum estimated kcal need).  - Pt is currently ordered Ensure Enlive once/day and Boost Breeze once/day.  - Plan to increase both of these to BID and order Prostat BID.  - Pt enjoys Ensure Enlive mixed with ice cream; mix Prostat with drink of choice.  - Will also order Magic Cup for all dinner trays.    10/24 Day #2 of Calorie Count follow-up.  Dinner 10/23: 330 kcal, 14 grams of protein Breakfast 10/24: 151 kcal, 3.5 grams of protein  Total of these two meals: 481 kcal (24% minimum estimated kcal need), 17.5 grams protein (13% minimum estimated protein need).  - Pt with triple lumen PICC and continues with cyclic TPN regimen: Clinimix 5/15 1990 mL over 14 hours with 20% ILE @ 20 mL/hr x12 hours on MWF.  - This regimen provides a daily average of 100 grams of protein (73% minimum estimated protein need), 1621 kcal (80% minimum estimated kcal need). - Weight has been stable since 08/06/17.  Phos: 4.7 mg/dL.    Diet Order:  Diet regular Room service appropriate? Yes; Fluid consistency: Thin  EDUCATION NEEDS:   No education needs identified at this time  Skin:  Skin Assessment: Wound (see comment) (Incisions to R flank and abdomen from 7/11 and 7/18)  Last BM:  10/25  Height:   Ht Readings from Last 1 Encounters:  05/26/17 5\' 10"  (1.778 m)    Weight:   Wt Readings from Last 1 Encounters:  08/21/17 168 lb 10.4 oz (76.5 kg)    Ideal Body Weight:  75.45 kg  BMI:  Body mass index is 24.2 kg/m.  Estimated Nutritional Needs:   Kcal:  2030-2270 (25-28 kcal/kg)  Protein:  137-153 grams (1.7-1.9 grams/kg)  Fluid:  2 L/day     Jarome Matin, MS, RD, LDN, [facility] Inpatient Clinical Dietitian Pager # [phone] After hours/weekend pager # [phone]

## 2017-08-21 NOTE — Progress Notes (Signed)
PATIENT ID: [name] [name]  MRN: [id number]  DOB/AGE:  1961-05-21 / 55 y.o.  21 Days Post-Op Procedure(s) (LRB): EXPLORATORY LAPAROTOMY WITH CLOSURE OF ENTEROTOMICS (N/A) LAPAROSCOPY DIAGNOSTIC (N/A) LYSIS OF ADHESIONS (N/A) INSERTION OF MESH (N/A)    PROGRESS NOTE Subjective:   Patient is alert, oriented Denies SOB, Chest or Calf Pain. Using Incentive Spirometer, PAS in place. Ambulate WBAT with therapy assist, Patient reports pain as moderate,     Objective: Vital signs in last 24 hours: Temp:  [97.6 F (36.4 C)-98.4 F (36.9 C)] 98.1 F (36.7 C) (10/26 0800) Pulse Rate:  [82-124] 91 (10/26 0800) Resp:  [12-26] 17 (10/26 1200) BP: (110-133)/(82-98) 113/82 (10/26 0800) SpO2:  [94 %-99 %] 98 % (10/26 1200) Weight:  [76.5 kg (168 lb 10.4 oz)] 76.5 kg (168 lb 10.4 oz) (10/26 0403)    Intake/Output from previous day: I/O last 3 completed shifts: In: 720 [P.O.:340; I.V.:180; IV Piggyback:200] Out: 1017 [Urine:1575; Stool:250]   Intake/Output this shift: Total I/O In: 150 [IV Piggyback:150] Out: -    LABORATORY DATA:  Recent Labs  08/20/17 0542 08/21/17 0533  NA 137 135  K 3.9 3.7  CL 105 101  CO2 24 23  BUN 14 14  CREATININE 0.58* 0.64  GLUCOSE 120* 113*  CALCIUM 9.1 9.6    Examination: Neurologically intact Neurovascular intact Sensation intact distally Intact pulses distally Dorsiflexion/Plantar flexion intact No cellulitis present Normal ROW with mild pain over medial joint line}  Assessment:   21 Days Post-Op Procedure(s) (LRB): EXPLORATORY LAPAROTOMY WITH CLOSURE OF ENTEROTOMICS (N/A) LAPAROSCOPY DIAGNOSTIC (N/A) LYSIS OF ADHESIONS (N/A) INSERTION OF MESH (N/A) ADDITIONAL DIAGNOSIS:   Plan:  Weight Bearing as Tolerated (WBAT)  Pt wishes to proceed with steroid injection today.  The benefits risks and complications of injections are discussed.  Under steril technique the left knee is injected with a 22 gage needle using a superior lateral approach.   3 cc of celestone and 3 cc of 1% marcaine was injected with out complications.  Pt is to follow up in office with Dr. Mayer Camel if needed when discharged home.       Antonino Nienhuis R 08/21/2017, 12:35 PM

## 2017-08-21 NOTE — Progress Notes (Signed)
[facility] Progress Note  21 Days Post-Op  Subjective: CC: wants to go home Patient with some improvement in PO intake overnight, took more of his PO medications. States nausea is about the same as what it has been. Generalized abdominal pain remains unchanged. Left knee pain remains unchanged from yesterday. Patient reports that he was actually able to sleep last night.  VSS.   Objective: Vital signs in last 24 hours: Temp:  [97.6 F (36.4 C)-99.1 F (37.3 C)] 98 F (36.7 C) (10/26 0347) Pulse Rate:  [82-124] 85 (10/26 0600) Resp:  [12-26] 14 (10/26 0715) BP: (110-138)/(83-98) 110/83 (10/26 0400) SpO2:  [94 %-99 %] 96 % (10/26 0715) Weight:  [76.5 kg (168 lb 10.4 oz)] 76.5 kg (168 lb 10.4 oz) (10/26 0403) Last BM Date: 08/20/17  Intake/Output from previous day: 10/25 0701 - 10/26 0700 In: 480 [P.O.:100; I.V.:180; IV Piggyback:200] Out: 1250 [Urine:1000; Stool:250] Intake/Output this shift: No intake/output data recorded.  PE: Gen:  Alert, NAD, pleasant Card:  Regular rate and rhythm Pulm:  Normal effort Abd: Soft, not distended, +BS; midline wound clean with good granulation tissue; lateral wound with exudate, foul smelling, no obvious feculent drainage, mesh visible.   Skin: warm and dry, no rashes  Psych: A&Ox3   Lab Results:  No results for input(s): WBC, HGB, HCT, PLT in the last 72 hours. BMET  Recent Labs  08/20/17 0542 08/21/17 0533  NA 137 135  K 3.9 3.7  CL 105 101  CO2 24 23  GLUCOSE 120* 113*  BUN 14 14  CREATININE 0.58* 0.64  CALCIUM 9.1 9.6   PT/INR No results for input(s): LABPROT, INR in the last 72 hours. CMP     Component Value Date/Time   NA 135 08/21/2017 0533   K 3.7 08/21/2017 0533   CL 101 08/21/2017 0533   CO2 23 08/21/2017 0533   GLUCOSE 113 (H) 08/21/2017 0533   BUN 14 08/21/2017 0533   CREATININE 0.64 08/21/2017 0533   CREATININE 0.85 07/22/2013 1021   CALCIUM 9.6 08/21/2017 0533   PROT 6.4 (L) 08/20/2017  0542   ALBUMIN 2.5 (L) 08/20/2017 0542   AST 57 (H) 08/20/2017 0542   ALT 59 08/20/2017 0542   ALKPHOS 209 (H) 08/20/2017 0542   BILITOT 0.5 08/20/2017 0542   GFRNONAA >60 08/21/2017 0533   GFRAA >60 08/21/2017 0533   Lipase     Component Value Date/Time   LIPASE 27 09/15/2015 0934       Studies/Results: Dg Knee 1-2 Views Left  Result Date: 08/20/2017 CLINICAL DATA:  Medial left knee pain.  No injury. EXAM: LEFT KNEE - 1-2 VIEW COMPARISON:  MRI 7 6 scratched it MRI 05/21/2013 . FINDINGS: No acute bony or joint abnormality identified. No evidence of fracture or dislocation. IMPRESSION: No acute abnormality. Electronically Signed   By: Marcello Moores  Register   On: 08/20/2017 14:53   Dg Chest Port 1 View  Result Date: 08/20/2017 CLINICAL DATA:  56 year old male with possible displacement of the PICC. Evaluate for positioning. EXAM: PORTABLE CHEST 1 VIEW COMPARISON:  Chest radiograph dated 07/30/2017 FINDINGS: A right-sided PICC is noted with tip over central SVC approximately at the level of the aortic arch. The tip of the PICC appears approximately 3 cm higher right compared to the prior radiograph, although this may be partly related to patient positioning during radiograph. There bibasilar subsegmental atelectasis. The left costophrenic angle has been excluded from the image. There is no focal consolidation, pleural effusion, or pneumothorax. Stable  cardiac silhouette. Cervical fixation hardware. No acute osseous pathology. IMPRESSION: Right-sided PICC with tip over central SVC. Electronically Signed   By: Anner Crete M.D.   On: 08/20/2017 21:23    Anti-infectives: Anti-infectives    Start     Dose/Rate Route Frequency Ordered Stop   08/11/17 1400  fluconazole (DIFLUCAN) IVPB 100 mg  Status:  Discontinued     100 mg 50 mL/hr over 60 Minutes Intravenous Every 24 hours 08/11/17 1312 08/16/17 1847   08/01/17 0600  metroNIDAZOLE (FLAGYL) IVPB 500 mg  Status:  Discontinued     500  mg 100 mL/hr over 60 Minutes Intravenous On call to O.R. 07/31/17 1656 07/31/17 1709   08/01/17 0600  ciprofloxacin (CIPRO) IVPB 400 mg  Status:  Discontinued     400 mg 200 mL/hr over 60 Minutes Intravenous On call to O.R. 07/31/17 1656 07/31/17 1709   07/31/17 0556  metroNIDAZOLE (FLAGYL) IVPB 500 mg     500 mg 100 mL/hr over 60 Minutes Intravenous On call to O.R. 07/31/17 0556 07/31/17 0840   07/31/17 0556  ciprofloxacin (CIPRO) IVPB 400 mg     400 mg 200 mL/hr over 60 Minutes Intravenous On call to O.R. 07/31/17 0556 07/31/17 0914   07/08/17 1600  fluconazole (DIFLUCAN) IVPB 100 mg     100 mg 50 mL/hr over 60 Minutes Intravenous Every 24 hours 07/08/17 0828 07/14/17 1710   07/01/17 1500  fluconazole (DIFLUCAN) IVPB 100 mg     100 mg 50 mL/hr over 60 Minutes Intravenous Every 24 hours 07/01/17 1401 07/07/17 1715   06/16/17 1200  piperacillin-tazobactam (ZOSYN) IVPB 3.375 g     3.375 g 12.5 mL/hr over 240 Minutes Intravenous Every 8 hours 06/16/17 1026 06/17/17 0004   06/08/17 1200  piperacillin-tazobactam (ZOSYN) IVPB 3.375 g  Status:  Discontinued     3.375 g 12.5 mL/hr over 240 Minutes Intravenous Every 8 hours 06/08/17 1056 06/16/17 1026   05/29/17 2200  ceFEPIme (MAXIPIME) 2 g in dextrose 5 % 50 mL IVPB  Status:  Discontinued     2 g 100 mL/hr over 30 Minutes Intravenous Every 8 hours 05/29/17 2031 06/03/17 0832   05/29/17 2100  metroNIDAZOLE (FLAGYL) IVPB 500 mg  Status:  Discontinued     500 mg 100 mL/hr over 60 Minutes Intravenous Every 8 hours 05/29/17 2029 06/03/17 0832   05/26/17 2200  ceFAZolin (ANCEF) IVPB 1 g/50 mL premix  Status:  Discontinued     1 g 100 mL/hr over 30 Minutes Intravenous Every 8 hours 05/26/17 1008 05/29/17 2023   05/25/17 1100  vancomycin (VANCOCIN) 1,250 mg in sodium chloride 0.9 % 250 mL IVPB  Status:  Discontinued     1,250 mg 166.7 mL/hr over 90 Minutes Intravenous Every 12 hours 05/25/17 0955 05/26/17 0946   05/22/17 1400  anidulafungin  (ERAXIS) 100 mg in sodium chloride 0.9 % 100 mL IVPB     100 mg 78 mL/hr over 100 Minutes Intravenous Every 24 hours 05/21/17 1330 05/27/17 1740   05/21/17 1400  anidulafungin (ERAXIS) 200 mg in sodium chloride 0.9 % 200 mL IVPB     200 mg 78 mL/hr over 200 Minutes Intravenous  Once 05/21/17 1330 05/21/17 1940   05/15/17 1000  anidulafungin (ERAXIS) 100 mg in sodium chloride 0.9 % 100 mL IVPB  Status:  Discontinued     100 mg 78 mL/hr over 100 Minutes Intravenous Every 24 hours 05/14/17 0829 05/16/17 0901   05/15/17 1000  metroNIDAZOLE (FLAGYL) IVPB 500 mg  Status:  Discontinued     500 mg 100 mL/hr over 60 Minutes Intravenous Every 8 hours 05/15/17 0903 05/25/17 0938   05/14/17 0900  anidulafungin (ERAXIS) 200 mg in sodium chloride 0.9 % 200 mL IVPB     200 mg 78 mL/hr over 200 Minutes Intravenous  Once 05/14/17 0829 05/14/17 1325   05/13/17 1927  vancomycin (VANCOCIN) 1-5 GM/200ML-% IVPB    Comments:  Ward, Christa   : cabinet override      05/13/17 1927 05/14/17 0744   05/12/17 1400  ceFEPIme (MAXIPIME) 1 g in dextrose 5 % 50 mL IVPB     1 g 100 mL/hr over 30 Minutes Intravenous Every 8 hours 05/12/17 0939 05/26/17 2359   05/12/17 0900  vancomycin (VANCOCIN) IVPB 1000 mg/200 mL premix  Status:  Discontinued     1,000 mg 200 mL/hr over 60 Minutes Intravenous Every 12 hours 05/12/17 0750 05/16/17 0852   05/12/17 0830  aztreonam (AZACTAM) 2 GM IVPB     2 g 100 mL/hr over 30 Minutes Intravenous  Once 05/12/17 0800 05/12/17 0957   05/06/17 0916  vancomycin (VANCOCIN) IVPB 1000 mg/200 mL premix     1,000 mg 200 mL/hr over 60 Minutes Intravenous On call to O.R. 05/06/17 0916 05/06/17 1229       Assessment/Plan Acute respiratory failure/ post op HCAP pneumonia/post op pleural effusion - CCM/extubated 05/28/17 Malnutrition - PICC/TNA 05/14/17; albumin 2.3 and preablumin 13.3 on 10/22 Oral thrush  Hx of hypertension Hx of anemia Hx of anxiety Hx of hypothyroid Chronic pain medicine  use - neck pain - Pt being followed by Palliative to assist with pain control. Tobacco use  Adrenal mass/hx of prior hemicolectomy/ileocolic anastomosis 1962 1. S/p laparoscopic converted to open right adrenalectomy, small bowel resection with anastomosis, placement of 20cm^2 wound vac, 05/06/17, Dr. Lurena Joiner [name] 2. Post op wound dehiscence - primary repair of small bowel anastomotic leak, abdominal closure of wound dehisence, 05/13/17, Luke [name] 3. S/p reopening of recent laparotomy, lysis of adhesions, small bowel resection, placement 200cm^2 negative pressure dressing, 05/14/17, Luke [name] 4. S/p reopening of recent laparotomy, creation of tube ileostomy, placement of 100cm2 negative pressure dressing, 05/17/17, Luke [name] 5. S/p abdominal wash out, placement of mesh for abdominal closure, placement of wound vac of 100cm^2, 05/20/17, Luke [name] 6. S/p Bronchoscopy 05/22/17 Dr. Simonne Maffucci EC fistula 06/13/17 - noted on exam  7. Laparoscopy, laparotomy, extensive enterolysis with removal of mesh (partially) in the right upper quadrant; resection of old anastomosis and ileotransverse anastomosis, 07/31/17,Dr.Matthew Hassell Done  - continue dressing changes - continue therapies - encourage PO intake, gradual weaning of TPN - scheduled reglan 5 mg q8h for nausea and gut motility - started remeron qhs to stimulate appetite  - palliative care assisting in pain management, have decreased fentanyl PCA - appreciate assistance and will continue follow recommendations regarding pain management  Left Knee pain- Dr. Mayer Camel planning steroid injection, outpatient follow up - no swelling, redness or signs of joint infection - heat/ice prn  FEN: Regular diet 08/14/17/cyclic TPN - started 22/9/79 - pharmacy adjusting electrolytes/Phos ID: Vancomycin 7/11- 05/25/17, Maixpime 7/17-7/31/18, Eraxis 7/19-05/27/17, Diflucan 9/5-9/14,&10/16-10/21/18; Cipro/ Flagyl pre-op 07/31/17 DVT:  Lovenox, SCDs Foley: Out Follow up: Hassell Done  Plan: Continue to wean TPN gradually. Pain management per palliative care. Encourage PO intake. Discontinue cardiac monitoring.   LOS: 107 days    Brigid Re , [facility] Surgery 08/21/2017, 7:37 AM Pager: [phone] Consults: [phone] Mon-Fri 7:00 am-4:30 pm Sat-Sun 7:00 am-11:30 am

## 2017-08-21 NOTE — Progress Notes (Signed)
Physical Therapy Treatment Patient Details Name: [name] [name] MRN: [id number] DOB: [birth year] Today's Date: 08/21/2017    History of Present Illness  56 y.o. male admitted 7/11 with right adrenal mass , s/p  right adrenalectomy complicated by small bowel injury requiring resection with anastomosis . Has required multiple surgeris, the last being 10/5 for  Laparoscopy, laparotomy, extensive enterolysis with removal of mesh (partially) in the right upper quadrant; resection of old anastomosis and ileotransverse anastomosiss    PT Comments    Assisted OOB to amb in hallway.  See gait details below.   Follow Up Recommendations  SNF;Supervision/Assistance - 24 hour;Home health PT (pending progress)     Equipment Recommendations  Rolling walker with 5" wheels    Recommendations for Other Services       Precautions / Restrictions Precautions Precautions: Fall Precaution Comments: abdominal wounds, left knee is buckling  a little Restrictions Weight Bearing Restrictions: No    Mobility  Bed Mobility Overal bed mobility: Needs Assistance Bed Mobility: Sidelying to Sit Rolling: Min assist   Supine to sit: Supervision;Min guard     General bed mobility comments: increased time  Transfers Overall transfer level: Needs assistance Equipment used: Rolling walker (2 wheeled) Transfers: Sit to/from Stand Sit to Stand: Min guard         General transfer comment: ,25% cues for hand placement  Ambulation/Gait Ambulation/Gait assistance: Min assist;+2 safety/equipment Ambulation Distance (Feet): 225 Feet Assistive device: Rolling walker (2 wheeled) Gait Pattern/deviations: Step-to pattern;Step-through pattern;Shuffle;Antalgic Gait velocity: git speed incr. from previous   General Gait Details: tolerated weight bear  throught the left, HR 141 max while ambulating   Stairs            Wheelchair Mobility    Modified Rankin (Stroke Patients Only)       Balance                                             Cognition Arousal/Alertness: Awake/alert Behavior During Therapy: WFL for tasks assessed/performed Overall Cognitive Status: Within Functional Limits for tasks assessed                                        Exercises      General Comments        Pertinent Vitals/Pain Pain Assessment: 0-10 Pain Score: 5  Pain Location: left  medial knee, Pain Descriptors / Indicators: Grimacing;Operative site guarding Pain Intervention(s): Monitored during session;Repositioned    Home Living                      Prior Function            PT Goals (current goals can now be found in the care plan section) Progress towards PT goals: Progressing toward goals    Frequency    Min 3X/week      PT Plan Current plan remains appropriate    Co-evaluation              AM-PAC PT "6 Clicks" Daily Activity  Outcome Measure  Difficulty turning over in bed (including adjusting bedclothes, sheets and blankets)?: A Lot Difficulty moving from lying on back to sitting on the side of the bed? : A Lot Difficulty sitting down on and standing up from  a chair with arms (e.g., wheelchair, bedside commode, etc,.)?: A Lot Help needed moving to and from a bed to chair (including a wheelchair)?: A Lot Help needed walking in hospital room?: A Lot Help needed climbing 3-5 steps with a railing? : A Lot 6 Click Score: 12    End of Session Equipment Utilized During Treatment: Gait belt Activity Tolerance: Patient limited by fatigue Patient left: with call bell/phone within reach;with family/visitor present;in bed Nurse Communication: Mobility status PT Visit Diagnosis: Difficulty in walking, not elsewhere classified (R26.2);Muscle weakness (generalized) (M62.81)     Time: 1282-0813 PT Time Calculation (min) (ACUTE ONLY): 24 min  Charges:  $Gait Training: 8-22 mins $Therapeutic Activity: 8-22 mins                     G Codes:       [name]  PTA [facility]  Acute  Rehab Pager      [phone]

## 2017-08-22 LAB — BASIC METABOLIC PANEL
Anion gap: 10 (ref 5–15)
BUN: 16 mg/dL (ref 6–20)
CO2: 21 mmol/L — ABNORMAL LOW (ref 22–32)
CREATININE: 0.64 mg/dL (ref 0.61–1.24)
Calcium: 9.7 mg/dL (ref 8.9–10.3)
Chloride: 104 mmol/L (ref 101–111)
GFR calc Af Amer: >60 mL/min (ref >60)
GLUCOSE: 138 mg/dL — AB (ref 65–99)
POTASSIUM: 5 mmol/L (ref 3.5–5.1)
Sodium: 135 mmol/L (ref 135–145)

## 2017-08-22 LAB — PHOSPHORUS: PHOSPHORUS: 3.8 mg/dL (ref 2.5–4.6)

## 2017-08-22 LAB — MAGNESIUM: MAGNESIUM: 1.7 mg/dL (ref 1.7–2.4)

## 2017-08-22 MED ORDER — FENTANYL 40 MCG/ML IV SOLN
INTRAVENOUS | Status: DC
  Administered 2017-08-22: 87.5 ug via INTRAVENOUS
  Administered 2017-08-23: 100 ug via INTRAVENOUS
  Administered 2017-08-23: 150 ug via INTRAVENOUS
  Administered 2017-08-23: 25 ug via INTRAVENOUS
  Administered 2017-08-23: 100 ug via INTRAVENOUS
  Administered 2017-08-23 (×2): 75 ug via INTRAVENOUS
  Administered 2017-08-24: 200 ug via INTRAVENOUS
  Administered 2017-08-24: 0 ug via INTRAVENOUS
  Administered 2017-08-24: 25 ug via INTRAVENOUS
  Administered 2017-08-24: 0 mL via INTRAVENOUS
  Administered 2017-08-24: 25 ug via INTRAVENOUS
  Administered 2017-08-24: 50 ug via INTRAVENOUS
  Administered 2017-08-25: 75 ug via INTRAVENOUS
  Administered 2017-08-25: 0 ug via INTRAVENOUS
  Administered 2017-08-25: 25 ug via INTRAVENOUS
  Administered 2017-08-26: 1000 ug via INTRAVENOUS
  Administered 2017-08-26: 25 ug via INTRAVENOUS
  Administered 2017-08-26: 50 ug via INTRAVENOUS
  Administered 2017-08-26 (×2): 0 ug via INTRAVENOUS
  Administered 2017-08-27: 104.7 ug via INTRAVENOUS
  Administered 2017-08-27: 75.38 ug via INTRAVENOUS
  Administered 2017-08-27: 165.3 ug via INTRAVENOUS
  Administered 2017-08-27: 25.54 ug via INTRAVENOUS
  Administered 2017-08-27: 153.3 ug via INTRAVENOUS
  Administered 2017-08-27: 262.2 ug via INTRAVENOUS
  Administered 2017-08-28: 75 ug via INTRAVENOUS
  Administered 2017-08-28: 150 ug via INTRAVENOUS
  Administered 2017-08-28: 125 ug via INTRAVENOUS
  Administered 2017-08-28: 100 ug via INTRAVENOUS
  Administered 2017-08-28 – 2017-08-29 (×3): 0 ug via INTRAVENOUS
  Administered 2017-08-29: 75 ug via INTRAVENOUS
  Administered 2017-08-29: 50 ug via INTRAVENOUS
  Administered 2017-08-30: 125 ug via INTRAVENOUS
  Administered 2017-08-30 – 2017-08-31 (×2): via INTRAVENOUS
  Administered 2017-08-31: 100 ug via INTRAVENOUS
  Filled 2017-08-22 (×6): qty 25

## 2017-08-22 MED ORDER — MAGNESIUM SULFATE 4 GM/100ML IV SOLN
4.0000 g | Freq: Once | INTRAVENOUS | Status: AC
  Administered 2017-08-22: 4 g via INTRAVENOUS
  Filled 2017-08-22: qty 100

## 2017-08-22 MED ORDER — TRACE MINERALS CR-CU-MN-SE-ZN 10-1000-500-60 MCG/ML IV SOLN
INTRAVENOUS | Status: AC
  Administered 2017-08-22: 18:00:00 via INTRAVENOUS
  Filled 2017-08-22 (×2): qty 1000

## 2017-08-22 MED ORDER — ALTEPLASE 2 MG IJ SOLR
2.0000 mg | Freq: Once | INTRAMUSCULAR | Status: AC
  Administered 2017-08-22: 2 mg
  Filled 2017-08-22: qty 2

## 2017-08-22 MED ORDER — ALUM & MAG HYDROXIDE-SIMETH 200-200-20 MG/5ML PO SUSP
30.0000 mL | Freq: Four times a day (QID) | ORAL | Status: DC | PRN
  Administered 2017-08-28 – 2017-08-31 (×6): 30 mL via ORAL
  Filled 2017-08-22 (×6): qty 30

## 2017-08-22 NOTE — Progress Notes (Signed)
[facility] NOTE   Pharmacy Consult for TPN Indication: prolonged ileus, small bowel leak, multiple bowel surgeries  Patient Measurements: Body mass index is 24.2 kg/m. Filed Weights   08/20/17 0459 08/21/17 0403 08/22/17 0500  Weight: 174 lb 13.2 oz (79.3 kg) 168 lb 10.4 oz (76.5 kg) 168 lb 10.4 oz (76.5 kg)   HPI: 60 yoM admitted on 7/11 for enlarging adrenal mass and planned adrenalectomy.  Pharmacy consulted to dose TPN.  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   7/22 OR:  wash out, tube ileostomy, left with open abdomen & wound vac in place.  Plan for OR on 7/25 for closure 7/25 OR: mesh placed for abdominal closure, wound vac 8/2 extubated, propofol drip off 8/4 TPN off for ~ 5 hours due to line detached from filter 8/10 MD request decrease rate x 1 week 2nd low sodium 8/12 Additional sodium added to TPN due to persistent hyponatremia 8/16 Increasing rate back to goal since sodium has improved with above intervention.   8/20 Prealbumin decreased.  New apparent enteric leak per surgery notes. 8/21 CaPhos product elevated, remove electrolytes from TPN 8/25 PICC did not have blood return in 2 of the 3 ports.  Alteplase intra-catheter and function returned.  TPN infusing correctly on 8/26 per RN. 8/28: d/c SSI 8/30: added electrolytes to TPN 8/31: TPN IV line came out around ~0340 and current bag was stopped at this time.  Restart new clinimix 5/15 1L bag (no lytes since phos is elevated this morning) until next bag is due at [facility]. Will not add MVI and trace elements to this 1L  bag. Use electrolyte free clinimix with new bag at [facility] 9/1 NGT out 9/3 added electrolytes to TPN 9/4 lytes out of TPN 9/7 cycle over 18 hrs 9/8 add back electrolytes to TPN.  Cycle over 14 hours 9/9 lytes out of TPN, continue 14 hr cycle, DC SSI/CBGs 9/13 electrolytes added back to TPN 9/14 remove electrolytes from TPN 10/3 Adjusted estimated needs based on prealbumin (decreased on 9/24, low but slightly improved on 10/1) and attempt to maximize nutrition prior to surgery planned for 07/31/17 - required increase of cycle to 16 hours. 10/5 to OR for fistula-repair surgery 10/6: Lytes added back to TPN with low Phos 10/10 D/C CBGs 10/14 NGT clamp trial 10/16: CLD ordered.  Per RN notes patient tolerated liquids, ice chips, small amnt jello 10/17: NGT removed 10/19: Diet advanced to Regular diet  10/21: Pharmacy received consult to wean TPN off.   10/22: Clarified with Dr. Rosendo Gros to gradually decrease TPN over next 2-days with plans to stop by mid-week 10/23: Oral intake remains <= 25% of needs. Refer to dietician note for details.  No anti-emetic use today 10/25  Further weaning to 1L TPN per surgery's request in hopes to increase appetite.  Pt reports eating bfast today (egg, french toast, sausage) without nausea, but vomited with pills this morning.  Increase dietary supplements and Prostat and encouraged to take small sips continuously throughout the day.  Pharmacy to continue to replace electrolytes IV. 10/26 Patient with better appetite this am, ate all oatmeal and [place] sausage!  No complaints other  than feeling full.  Took oral meds.  PICC slid out some, CXR reveal PICC over SVC 10/27 patient's intake remains below goal but is improving.  Emphasized taking supplements. Issues with oral meds (cause nausea) and problems swallowing multivitamin  Insulin requirements past 24 hours: none  Current Nutrition: regular diet started 10/19 Supplements:  Boost BID, Ensure BID, Prostat BID, Magic  Cup daily (dietary orders only)  IVF: none  Central access: PICC line ordered 7/19, placed on 7/20 TPN start date: 7/20  ASSESSMENT                                                                                                           Today, 08/22/17  - Glucose:  (goal 100-150) At goal.  SSI/CBGs discontinued 10/10. - Electrolytes:  K = 5 9 w/o hemolysis (withold replacement today)  Mag 1.7  - daily IV replacement   Phos 3.8- has been variable, lytes removed from TPN to avoid high Phos levels  Na WNL since adding Na to electrolyte free TPN.  Continue at ~ 0.225% Na equivalence.     Corr Ca 10.8; Ca/phos product 68  (goal < 55) - Renal:  SCr wnl, stable - NGT removed. Using some PRN promethazine, no Zofran for several days, but pt reports emesis after taking PO meds.  Encouraged use of PRNs. - LFTs:  ALT WNL, AST 57 and Alk Phos 209 elevated and increased (10/25) - TGs:  Slightly elevated prior to starting TPN, but now WNL (141 on 10/22) - Prealbumin: Low on admission but trended up and remained at or near goal during Aug to Sep;  (15.6 on 10/15 decreased to 13.3 on 10/22).    NUTRITIONAL GOALS                                                                                             RD recs (10/24):  137-153 gms Protein (1.7 - 1.9 g/kg), 2030 - 2270 Kcal (25-28 kcal/kg)  Recalculation (10/24): Clinimix 5/15 1000 mL/day with lipids 3x/week on MWF only would provide: 50g protein (36% minimum estimated protein need) and 870 kcal (43% minimum estimated kcal need) - Ensure Enlive BID (each: 350 kcal and 20 g protein) - Boost Breeze BID (each: 250 kcal and 9 g protein) - Prostat BID (each: 100 kcal and 15 grams of protein).  Pt refusing d/t texture/taste. - Magic Cup daily (each: 290 kcal and 9 g protein).    PLAN  Now:  Mag 4g IV x 1  Pharmacy to continue IV  electrolyte replacement while on TPN and until more consistently tolerating PO intake.  At 1800 today:  Continue TPN Clinimix 5/15 (electrolyte free) 1000 mL/day cycled over 12 hours.  50 ml/hr x 1 hr (6p-7p), 90 ml/hr x 10hr (7p-5a), 50 ml/hr x 1 hr (5a-6a), then off  Sodium added to Clinimix to keep equivalent to ~ 0.225% Na:  adding 19 mEq NaCl and 19 mEq Na Acetate in the 1L TPN bag   Lipids at 10 mls/hr x 12 hours 3 times weekly on MWF TPN to contain standard multivitamins and trace elements.  Continue PO MVI order, but he is currently refusing PO.  TPN lab panels on Mondays & Thursdays.   BMET, Mag & Phos daily ordered.  Follow up patient's oral intake. Will continue 1L TPN until enteral intake is meeting >60% nutrition goals or otherwise requested by surgery.  Doreene Eland, PharmD, BCPS.   Pager: [phone] 08/22/2017 8:51 AM

## 2017-08-22 NOTE — Progress Notes (Signed)
General Surgery [facility]  Assessment & Plan: Laparoscopy, laparotomy, extensive enterolysis with removal of mesh (partially) in the right upper quadrant; resection of old anastomosis and ileotransverse anastomosis, 07/31/17,Dr.Matthew Hassell Done  - continue dressing changes - continue therapies - encourage PO intake, gradual weaning of TPN - scheduled reglan 5 mg q8h for nausea and gut motility - started remeron qhs to stimulate appetite  - palliative care assisting in pain management, have decreased fentanyl PCA - appreciate assistance and will continue follow recommendations regarding pain management  Left Knee pain- Dr. Mayer Camel planning steroid injection, outpatient follow up - no swelling, redness or signs of joint infection - heat/ice prn        Earnstine Regal, MD, [facility]       Office: [phone]    Chief Complaint: Hiccups this AM  Subjective: Patient in bed, appears comfortable.  Pleasant.  No complaints other than hiccups this AM.  Wants to go home.  Objective: Vital signs in last 24 hours: Temp:  [97.5 F (36.4 C)-98.4 F (36.9 C)] 98.1 F (36.7 C) (10/27 0740) Pulse Rate:  [87-101] 88 (10/27 0350) Resp:  [12-21] 12 (10/27 0400) BP: (113-135)/(74-88) 124/82 (10/27 0350) SpO2:  [96 %-98 %] 98 % (10/27 0400) Weight:  [76.5 kg (168 lb 10.4 oz)] 76.5 kg (168 lb 10.4 oz) (10/27 0500) Last BM Date: 08/21/17  Intake/Output from previous day: 10/26 0701 - 10/27 0700 In: 950.3 [P.O.:200; I.V.:600.3; IV Piggyback:150] Out: 831 [Urine:875] Intake/Output this shift: No intake/output data recorded.  Physical Exam: HEENT - sclerae clear, mucous membranes moist Neck - soft Chest - clear bilaterally Cor - RRR Abdomen - soft, wounds with dressings in place, no drainage; binder replaced Ext - no edema, non-tender Neuro - alert & oriented, no focal deficits  Lab Results:  No results for input(s): WBC, HGB, HCT,  PLT in the last 72 hours. BMET  Recent Labs  08/21/17 0533 08/22/17 0318  NA 135 135  K 3.7 5.0  CL 101 104  CO2 23 21*  GLUCOSE 113* 138*  BUN 14 16  CREATININE 0.64 0.64  CALCIUM 9.6 9.7   PT/INR No results for input(s): LABPROT, INR in the last 72 hours. Comprehensive Metabolic Panel:    Component Value Date/Time   NA 135 08/22/2017 0318   NA 135 08/21/2017 0533   K 5.0 08/22/2017 0318   K 3.7 08/21/2017 0533   CL 104 08/22/2017 0318   CL 101 08/21/2017 0533   CO2 21 (L) 08/22/2017 0318   CO2 23 08/21/2017 0533   BUN 16 08/22/2017 0318   BUN 14 08/21/2017 0533   CREATININE 0.64 08/22/2017 0318   CREATININE 0.64 08/21/2017 0533   CREATININE 0.85 07/22/2013 1021   GLUCOSE 138 (H) 08/22/2017 0318   GLUCOSE 113 (H) 08/21/2017 0533   CALCIUM 9.7 08/22/2017 0318   CALCIUM 9.6 08/21/2017 0533   AST 57 (H) 08/20/2017 0542   AST 17 08/17/2017 0450   ALT 59 08/20/2017 0542   ALT 20 08/17/2017 0450   ALKPHOS 209 (H) 08/20/2017 0542   ALKPHOS 138 (H) 08/17/2017 0450   BILITOT 0.5 08/20/2017 0542   BILITOT 0.5 08/17/2017 0450   PROT 6.4 (L) 08/20/2017 0542   PROT 6.3 (L) 08/17/2017 0450   ALBUMIN 2.5 (L) 08/20/2017 0542   ALBUMIN 2.3 (L) 08/17/2017 0450    Studies/Results: Dg Knee 1-2 Views Left  Result Date: 08/20/2017 CLINICAL DATA:  Medial left  knee pain.  No injury. EXAM: LEFT KNEE - 1-2 VIEW COMPARISON:  MRI 7 6 scratched it MRI 05/21/2013 . FINDINGS: No acute bony or joint abnormality identified. No evidence of fracture or dislocation. IMPRESSION: No acute abnormality. Electronically Signed   By: Marcello Moores  Register   On: 08/20/2017 14:53   Dg Chest Port 1 View  Result Date: 08/20/2017 CLINICAL DATA:  56 year old male with possible displacement of the PICC. Evaluate for positioning. EXAM: PORTABLE CHEST 1 VIEW COMPARISON:  Chest radiograph dated 07/30/2017 FINDINGS: A right-sided PICC is noted with tip over central SVC approximately at the level of the aortic  arch. The tip of the PICC appears approximately 3 cm higher right compared to the prior radiograph, although this may be partly related to patient positioning during radiograph. There bibasilar subsegmental atelectasis. The left costophrenic angle has been excluded from the image. There is no focal consolidation, pleural effusion, or pneumothorax. Stable cardiac silhouette. Cervical fixation hardware. No acute osseous pathology. IMPRESSION: Right-sided PICC with tip over central SVC. Electronically Signed   By: Anner Crete M.D.   On: 08/20/2017 21:23      [place] M 08/22/2017  Patient ID: [name] [name], male   DOB: [birth year], 56 y.o.   MRN: [id number]

## 2017-08-23 LAB — BASIC METABOLIC PANEL
Anion gap: 7 (ref 5–15)
BUN: 20 mg/dL (ref 6–20)
CALCIUM: 9.4 mg/dL (ref 8.9–10.3)
CO2: 25 mmol/L (ref 22–32)
CREATININE: 0.63 mg/dL (ref 0.61–1.24)
Chloride: 106 mmol/L (ref 101–111)
GFR calc non Af Amer: >60 mL/min (ref >60)
Glucose, Bld: 102 mg/dL — ABNORMAL HIGH (ref 65–99)
Potassium: 4 mmol/L (ref 3.5–5.1)
SODIUM: 138 mmol/L (ref 135–145)

## 2017-08-23 LAB — PHOSPHORUS: PHOSPHORUS: 4.2 mg/dL (ref 2.5–4.6)

## 2017-08-23 LAB — MAGNESIUM: MAGNESIUM: 1.7 mg/dL (ref 1.7–2.4)

## 2017-08-23 MED ORDER — TRACE MINERALS CR-CU-MN-SE-ZN 10-1000-500-60 MCG/ML IV SOLN
INTRAVENOUS | Status: AC
  Administered 2017-08-23: 18:00:00 via INTRAVENOUS
  Filled 2017-08-23: qty 1000

## 2017-08-23 MED ORDER — MAGNESIUM SULFATE 4 GM/100ML IV SOLN
4.0000 g | Freq: Once | INTRAVENOUS | Status: AC
  Administered 2017-08-23: 4 g via INTRAVENOUS
  Filled 2017-08-23: qty 100

## 2017-08-23 MED ORDER — HYDROCORTISONE 2.5 % RE CREA: 1.0000 | TOPICAL_CREAM | Freq: Four times a day (QID) | RECTAL | Status: DC | PRN

## 2017-08-23 MED ORDER — WITCH HAZEL-GLYCERIN EX PADS: 1.0000 "application " | MEDICATED_PAD | CUTANEOUS | Status: DC | PRN

## 2017-08-23 MED ORDER — POTASSIUM CHLORIDE 10 MEQ/50ML IV SOLN
10.0000 meq | INTRAVENOUS | Status: AC
  Administered 2017-08-23 (×3): 10 meq via INTRAVENOUS
  Filled 2017-08-23 (×3): qty 50

## 2017-08-23 NOTE — Progress Notes (Signed)
[place] NOTE   Pharmacy Consult for TPN Indication: prolonged ileus, small bowel leak, multiple bowel surgeries  Patient Measurements: Body mass index is 24.29 kg/m. Filed Weights   08/21/17 0403 08/22/17 0500 08/23/17 0500  Weight: 168 lb 10.4 oz (76.5 kg) 168 lb 10.4 oz (76.5 kg) 169 lb 5 oz (76.8 kg)   HPI: 55 yoM admitted on 7/11 for enlarging adrenal mass and planned adrenalectomy.  Pharmacy consulted to dose TPN.  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   7/22 OR:  wash out, tube ileostomy, left with open abdomen & wound vac in place.  Plan for OR on 7/25 for closure 7/25 OR: mesh placed for abdominal closure, wound vac 8/2 extubated, propofol drip off 8/4 TPN off for ~ 5 hours due to line detached from filter 8/10 MD request decrease rate x 1 week 2nd low sodium 8/12 Additional sodium added to TPN due to persistent hyponatremia 8/16 Increasing rate back to goal since sodium has improved with above intervention.   8/20 Prealbumin decreased.  New apparent enteric leak per surgery notes. 8/21 CaPhos product elevated, remove electrolytes from TPN 8/25 PICC did not have blood return in 2 of the 3 ports.  Alteplase intra-catheter and function returned.  TPN infusing correctly on 8/26 per RN. 8/28: d/c SSI 8/30: added electrolytes to TPN 8/31: TPN IV line came out around ~0340 and current bag was stopped at this time.  Restart new clinimix 5/15 1L bag (no lytes since phos is elevated this morning) until next bag is due at [facility]. Will not add MVI and trace elements to this 1L  bag. Use electrolyte free clinimix with new bag at [facility] 9/1 NGT out 9/3 added electrolytes to TPN 9/4 lytes out of TPN 9/7 cycle over 18 hrs 9/8 add back electrolytes to TPN.  Cycle over 14 hours 9/9 lytes out of TPN, continue 14 hr cycle, DC SSI/CBGs 9/13 electrolytes added back to TPN 9/14 remove electrolytes from TPN 10/3 Adjusted estimated needs based on prealbumin (decreased on 9/24, low but slightly improved on 10/1) and attempt to maximize nutrition prior to surgery planned for 07/31/17 - required increase of cycle to 16 hours. 10/5 to OR for fistula-repair surgery 10/6: Lytes added back to TPN with low Phos 10/10 D/C CBGs 10/14 NGT clamp trial 10/16: CLD ordered.  Per RN notes patient tolerated liquids, ice chips, small amnt jello 10/17: NGT removed 10/19: Diet advanced to Regular diet  10/21: Pharmacy received consult to wean TPN off.   10/22: Clarified with Dr. Rosendo Gros to gradually decrease TPN over next 2-days with plans to stop by mid-week 10/23: Oral intake remains <= 25% of needs. Refer to dietician note for details.  No anti-emetic use today 10/25  Further weaning to 1L TPN per surgery's request in hopes to increase appetite.  Pt reports eating bfast today (egg, french toast, sausage) without nausea, but vomited with pills this morning.  Increase dietary supplements and Prostat and encouraged to take small sips continuously throughout the day.  Pharmacy to continue to replace electrolytes IV. 10/26 Patient with better appetite this am, ate all oatmeal and [place] sausage!  No complaints other  than feeling full.  Took oral meds.  PICC slid out some, CXR reveal PICC over SVC 10/27 patient's intake remains below goal but is improving.  Emphasized taking supplements. Issues with oral meds (cause nausea) and problems swallowing multivitamin 10/28 patient woke up more nauseated.  Has not eaten today  Insulin requirements past 24 hours: none  Current Nutrition: regular diet started  10/19 Supplements:  Resource breeze BID, Ensure BID, Magic Cup daily (dietary orders only)  IVF: none  Central access: PICC line ordered 7/19, placed on 7/20 TPN start date: 7/20  ASSESSMENT                                                                                                           Today, 08/23/17  - Glucose:  (goal 100-150) At goal.  SSI/CBGs discontinued 10/10. - Electrolytes:  K = 4 w/o hemolysis (withold replacement today)  Mag 1.7  - daily IV replacement   Phos 4.2- has been variable, lytes removed from TPN to avoid high Phos levels  Na WNL since adding Na to electrolyte free TPN.  Continue at ~ 0.225% Na equivalence.     Corr Ca 10.8; Ca/phos product 68  (goal < 55) - Renal:  SCr wnl, stable - NGT removed. Using some PRN promethazine, no Zofran for several days, but pt reports emesis after taking PO meds.  Encouraged use of PRNs. - LFTs:  ALT WNL, AST 57 and Alk Phos 209 elevated and increased (10/25) - TGs:  Slightly elevated prior to starting TPN, but now WNL (141 on 10/22) - Prealbumin: Low on admission but trended up and remained at or near goal during Aug to Sep;  (15.6 on 10/15 decreased to 13.3 on 10/22).    NUTRITIONAL GOALS                                                                                             RD recs (10/24):  137-153 gms Protein (1.7 - 1.9 g/kg), 2030 - 2270 Kcal (25-28 kcal/kg)  Recalculation (10/24): Clinimix 5/15 1000 mL/day with lipids 3x/week on MWF only would provide: 50g protein (36% minimum estimated protein need) and 870 kcal (43% minimum estimated kcal need) - Ensure Enlive BID (each: 350 kcal and 20 g protein) - Boost Breeze BID (each: 250 kcal and 9 g protein) - Magic Cup daily (each: 290 kcal and 9 g protein).    PLAN  Now:  Mag 4g IV x 1  KCl x 3 runs  Prefer to replace by oral route but  patient states tabs make him nauseous  Pharmacy to continue IV electrolyte replacement while on TPN and until more consistently tolerating PO intake.  At 1800 today:  Continue TPN Clinimix 5/15 (electrolyte free) 1000 mL/day cycled over 12 hours.  50 ml/hr x 1 hr (6p-7p), 90 ml/hr x 10hr (7p-5a), 50 ml/hr x 1 hr (5a-6a), then off  Sodium added to Clinimix to keep equivalent to ~ 0.225% Na:  adding 19 mEq NaCl and 19 mEq Na Acetate in the 1L TPN bag   Lipids at 10 mls/hr x 12 hours 3 times weekly on MWF TPN to contain standard multivitamins and trace elements.  Continue PO MVI order, but he is currently refusing PO.  TPN lab panels on Mondays & Thursdays.   BMET, Mag & Phos daily ordered.  Follow up patient's oral intake. Will continue 1L TPN until enteral intake is meeting >60% nutrition goals or otherwise requested by surgery.  Doreene Eland, PharmD, BCPS.   Pager: [phone] 08/23/2017 8:07 AM

## 2017-08-23 NOTE — Progress Notes (Signed)
Palliative care progress note  Reason for visit: Uncontrolled pain  I received a call from RN last night that Mr. Snelgrove's pain was uncontrolled.  Reviewed chart and noted that his PCA dosage had been decreased to 12.5 mcg with 20-minute lockout.  At that point, I increased back to 25 mcg a 20-minute lockout.  I saw and examined Mr. Tones this afternoon.  He was sitting in bedside chair at time of encounter.  Wife was also present at bedside.  We discussed his pain and he states that his pain became uncontrolled again yesterday but has improved since resuming higher dose of breakthrough medication.  I reviewed PCA and he has used approximately 475 additional micrograms of fentanyl over the last 24 hours.  This is not an amount of breakthrough medication that could not be converted to oral regimen once he gets to a point where he is able to tolerate p.o., however, as noted by Dr. Hilma Favors, it will be difficult to transition him off of IV pain medications until he is reliably taking p.o. Medications.  He refuses to even consider p.o. medications at this time and requests to maintain same regimen to allow things to "cool off" today.  Would continue process of weaning PCA as we are able while also continuing to encourage him transition over to oral medications as he is able.  Total time: 20 minutes Greater than 50%  of this time was spent counseling and coordinating care related to the above assessment and plan.  Micheline Rough, MD [place] Team [phone]

## 2017-08-23 NOTE — Progress Notes (Signed)
  General Surgery [facility]  Assessment & Plan: Laparoscopy, laparotomy, extensive enterolysis with removal of mesh (partially) in the right upper quadrant; resection of old anastomosis and ileotransverse anastomosis, 07/31/17,Dr.Matthew Hassell [name]  - continue dressing changes - continue therapies - encourage PO intake, gradual weaning of TPN per pharmacy recommendations - palliative care assisting in pain management - will leave pain Rx regimen to them - phenergan for nausea this AM - having difficulty swallowing pills  Left Knee pain- Dr. Mayer [name] planning steroid injection, outpatient follow up - no swelling, redness or signs of joint infection - heat/ice prn        [name] Regal, MD, [facility]       Office: [phone]    Chief Complaint: Nausea after breakfast  Subjective: Nausea after breakfast, having trouble swallowing pills for pain control  Objective: Vital signs in last 24 hours: Temp:  [97.6 F (36.4 C)-98.4 F (36.9 C)] 98.4 F (36.9 C) (10/28 0700) Pulse Rate:  [75-89] 85 (10/28 0800) Resp:  [11-23] 16 (10/28 0800) BP: (108-127)/(82-83) 108/82 (10/28 0800) SpO2:  [94 %-99 %] 98 % (10/28 0800) Weight:  [76.8 kg (169 lb 5 oz)] 76.8 kg (169 lb 5 oz) (10/28 0500) Last BM Date: 08/22/17  Intake/Output from previous day: 10/27 0701 - 10/28 0700 In: -  Out: 450 [Urine:450] Intake/Output this shift: Total I/O In: 10 [I.V.:10] Out: 350 [Urine:100; Stool:250]  Physical Exam: HEENT - sclerae clear, mucous membranes moist Neck - soft Chest - clear bilaterally Cor - RRR Abdomen - binder in place, dry, intact; non-tender Ext - no edema, non-tender Neuro - alert & oriented, no focal deficits  Lab Results:  No results for input(s): WBC, HGB, HCT, PLT in the last 72 hours. BMET  Recent Labs  08/22/17 0318 08/23/17 0500  NA 135 138  K 5.0 4.0  CL 104 106  CO2 21* 25  GLUCOSE 138* 102*  BUN 16 20   CREATININE 0.64 0.63  CALCIUM 9.7 9.4   PT/INR No results for input(s): LABPROT, INR in the last 72 hours. Comprehensive Metabolic Panel:    Component Value Date/Time   NA 138 08/23/2017 0500   NA 135 08/22/2017 0318   K 4.0 08/23/2017 0500   K 5.0 08/22/2017 0318   CL 106 08/23/2017 0500   CL 104 08/22/2017 0318   CO2 25 08/23/2017 0500   CO2 21 (L) 08/22/2017 0318   BUN 20 08/23/2017 0500   BUN 16 08/22/2017 0318   CREATININE 0.63 08/23/2017 0500   CREATININE 0.64 08/22/2017 0318   CREATININE 0.85 07/22/2013 1021   GLUCOSE 102 (H) 08/23/2017 0500   GLUCOSE 138 (H) 08/22/2017 0318   CALCIUM 9.4 08/23/2017 0500   CALCIUM 9.7 08/22/2017 0318   AST 57 (H) 08/20/2017 0542   AST 17 08/17/2017 0450   ALT 59 08/20/2017 0542   ALT 20 08/17/2017 0450   ALKPHOS 209 (H) 08/20/2017 0542   ALKPHOS 138 (H) 08/17/2017 0450   BILITOT 0.5 08/20/2017 0542   BILITOT 0.5 08/17/2017 0450   PROT 6.4 (L) 08/20/2017 0542   PROT 6.3 (L) 08/17/2017 0450   ALBUMIN 2.5 (L) 08/20/2017 0542   ALBUMIN 2.3 (L) 08/17/2017 0450    Studies/Results: No results found.    [name] [name] 08/23/2017  Patient ID: [name] [name], male   DOB: 1961-08-26, 56 y.o.   MRN: [id number]

## 2017-08-24 LAB — COMPREHENSIVE METABOLIC PANEL
ALK PHOS: 225 U/L — AB (ref 38–126)
ALT: 156 U/L — AB (ref 17–63)
AST: 75 U/L — AB (ref 15–41)
Albumin: 3.3 g/dL — ABNORMAL LOW (ref 3.5–5.0)
Anion gap: 10 (ref 5–15)
BILIRUBIN TOTAL: 0.5 mg/dL (ref 0.3–1.2)
BUN: 21 mg/dL — AB (ref 6–20)
CALCIUM: 9.9 mg/dL (ref 8.9–10.3)
CO2: 24 mmol/L (ref 22–32)
CREATININE: 0.69 mg/dL (ref 0.61–1.24)
Chloride: 104 mmol/L (ref 101–111)
GFR calc Af Amer: >60 mL/min (ref >60)
Glucose, Bld: 91 mg/dL (ref 65–99)
POTASSIUM: 4.3 mmol/L (ref 3.5–5.1)
Sodium: 138 mmol/L (ref 135–145)
TOTAL PROTEIN: 8.1 g/dL (ref 6.5–8.1)

## 2017-08-24 LAB — DIFFERENTIAL
BASOS ABS: 0 10*3/uL (ref 0.0–0.1)
BASOS PCT: 0 %
EOS ABS: 0.2 10*3/uL (ref 0.0–0.7)
Eosinophils Relative: 2 %
Lymphocytes Relative: 43 %
Lymphs Abs: 4.6 10*3/uL — ABNORMAL HIGH (ref 0.7–4.0)
Monocytes Absolute: 1.3 10*3/uL — ABNORMAL HIGH (ref 0.1–1.0)
Monocytes Relative: 12 %
NEUTROS ABS: 4.6 10*3/uL (ref 1.7–7.7)
NEUTROS PCT: 43 %

## 2017-08-24 LAB — PHOSPHORUS: Phosphorus: 4.7 mg/dL — ABNORMAL HIGH (ref 2.5–4.6)

## 2017-08-24 LAB — CBC
HCT: 33.4 % — ABNORMAL LOW (ref 39.0–52.0)
Hemoglobin: 10.5 g/dL — ABNORMAL LOW (ref 13.0–17.0)
MCH: 24.1 pg — ABNORMAL LOW (ref 26.0–34.0)
MCHC: 31.4 g/dL (ref 30.0–36.0)
MCV: 76.6 fL — ABNORMAL LOW (ref 78.0–100.0)
PLATELETS: 644 10*3/uL — AB (ref 150–400)
RBC: 4.36 MIL/uL (ref 4.22–5.81)
RDW: 19.7 % — AB (ref 11.5–15.5)
WBC: 10.7 10*3/uL — AB (ref 4.0–10.5)

## 2017-08-24 LAB — MAGNESIUM: MAGNESIUM: 1.7 mg/dL (ref 1.7–2.4)

## 2017-08-24 LAB — TRIGLYCERIDES: Triglycerides: 157 mg/dL — ABNORMAL HIGH (ref <150)

## 2017-08-24 LAB — PREALBUMIN: Prealbumin: 33 mg/dL (ref 18–38)

## 2017-08-24 MED ORDER — POTASSIUM CHLORIDE 10 MEQ/50ML IV SOLN
10.0000 meq | INTRAVENOUS | Status: AC
  Administered 2017-08-24 (×3): 10 meq via INTRAVENOUS
  Filled 2017-08-24 (×3): qty 50

## 2017-08-24 MED ORDER — SUCRALFATE 1 GM/10ML PO SUSP
1.0000 g | Freq: Three times a day (TID) | ORAL | Status: DC
  Administered 2017-08-24 – 2017-08-25 (×3): 1 g via ORAL
  Filled 2017-08-24 (×3): qty 10

## 2017-08-24 MED ORDER — METOCLOPRAMIDE HCL 5 MG/ML IJ SOLN
10.0000 mg | Freq: Three times a day (TID) | INTRAMUSCULAR | Status: DC
  Administered 2017-08-24 – 2017-09-01 (×24): 10 mg via INTRAVENOUS
  Filled 2017-08-24 (×24): qty 2

## 2017-08-24 MED ORDER — ATENOLOL 25 MG PO TABS: 50.0000 mg | ORAL_TABLET | Freq: Two times a day (BID) | ORAL | Status: DC

## 2017-08-24 MED ORDER — ONDANSETRON HCL 4 MG/2ML IJ SOLN
4.0000 mg | Freq: Three times a day (TID) | INTRAMUSCULAR | Status: DC | PRN
  Administered 2017-08-24 – 2017-08-28 (×4): 4 mg via INTRAVENOUS
  Filled 2017-08-24 (×4): qty 2

## 2017-08-24 MED ORDER — TRACE MINERALS CR-CU-MN-SE-ZN 10-1000-500-60 MCG/ML IV SOLN
INTRAVENOUS | Status: AC
  Filled 2017-08-24: qty 1000

## 2017-08-24 MED ORDER — LORAZEPAM 1 MG PO TABS
1.0000 mg | ORAL_TABLET | Freq: Three times a day (TID) | ORAL | Status: DC
  Administered 2017-08-26 – 2017-09-03 (×22): 1 mg via ORAL
  Filled 2017-08-24 (×24): qty 1

## 2017-08-24 MED ORDER — MAGNESIUM SULFATE 4 GM/100ML IV SOLN
4.0000 g | Freq: Once | INTRAVENOUS | Status: AC
  Administered 2017-08-24: 4 g via INTRAVENOUS
  Filled 2017-08-24: qty 100

## 2017-08-24 MED ORDER — FAT EMULSION 20 % IV EMUL
120.0000 mL | INTRAVENOUS | Status: AC
  Filled 2017-08-24: qty 250

## 2017-08-24 NOTE — Progress Notes (Signed)
Date:  August 24, 2017 Chart reviewed for concurrent status and case management needs.  Will continue to follow patient progress.  Discharge Planning: following for needs  Expected discharge date: [id number]  Rhonda Davis, BSN, RN3, CCM   [phone]  

## 2017-08-24 NOTE — Progress Notes (Signed)
Palliative Care Follow-up Note-Symptom Management  [name] [name] continues to have difficulty taking pills due to "nausea" this has complicated his pain and anxiety regimen/transition. I have made several changes as follows:  1. Reduced his PCA bolus and removed his end tidal CO2 monitoring for his comfort. No basal PCA.  2. Plan for next few days/week titrate down his Transdermal Fentanyl every three days by 50mcg doses based on PCA usage.For his next Transdermal Fentanyl change would decrease to 125 mcg/hr.  3. Schedule Percocet 10mg  TID (this was his PTA regimen) for cervical radiculopathy.  4. Should continue mirtazapine at bedtime-he refused to take the low dose Zyprexa for nausea at bedtime which I think was helping him significantly-if nausea persists this should be continued.  5. I did a complete med reconciliation and eliminated any medications via oral route that were not needed ex. Pravastatin stopped.  6. For his anxiety I am going to switch him to scheduled Ativan since he seems to be taking this frequently, and requests it. Anxiety especially anticipatory anxiety is a huge contributor here to his transition.  7. Discontinued his Robaxin- can cause nausea.  8. He has persistent and extensive oral and pharyngeal thrush- and probably down his esophagis as well- I examined his mouth and he has a thick white coating on his tongue with classic irregularly shaped satellite plaques on his buccal and pharyngeal surfaces that cannot be removed without causing damage to the surface. He had a very long course of IV fluconazole with some improvement- but it is getting worse again off the antifungals- I suspect his oral barrier is impaired just from the stress of his illness and poor PO intake for an extended period of time. Would appreciate pharmacy recs on alternative regimen- will try to obtain a culture since he has been off antifungals for a while now- I think this may be contributing to his  nausea as well and difficulty eating and taking pills. He reporst only a "bad taste" no real discomfort in his mouth -May need additional evaluation to confirm.  9. Scheduling phenergan oral low dose before meals- he prefers IV and isn't really eating enough to sustain his nutrition. He remains on TNA.  10. Needs an aggressive bowel regimen and close attention to frequency of stools. Should consider sq injectable Relistor if >2 days since BM.  I have spoken with [name] Hakim, MD his outpatient pain specialist- when it is time to d/c please call his office to schedule him an appointment and they will help with his pain regimen and continue to get him off high dose opioids.  He would possibly be a good candidate for inpatient rehab for his pain management and severe deconditioning- also if TNA will be continued - [name] will need a full team to care for him especially in the rehabilitation phase- this has been physically and emotionally debilitating for him and his coping has been challenge for symptom management and he may benefit from their services.  [name] Hacker, DO Palliative Medicine Team [phone] Cell [phone]

## 2017-08-24 NOTE — Progress Notes (Signed)
08/24/17  1700 Asked patient and his wife about patient wearing the CO2 monitor d/t the PCA. Pt wife stated that per MD he is not required to wear CO2 monitor d/t the amount of Fentanyl patient is receiving. Looked back at MD note and it was removed for patient's comfort.

## 2017-08-24 NOTE — Progress Notes (Signed)
Physical Therapy Treatment Patient Details Name: [name] [name] MRN: [id number] DOB: 1961-09-21 Today's Date: 08/24/2017    History of Present Illness  56 y.o. male admitted 7/11 with right adrenal mass , s/p  right adrenalectomy complicated by small bowel injury requiring resection with anastomosis . Has required multiple surgeris, the last being 10/5 for  Laparoscopy, laparotomy, extensive enterolysis with removal of mesh (partially) in the right upper quadrant; resection of old anastomosis and ileotransverse anastomosiss    PT Comments    Ambulated 300 ft with rolling walker. Began with step to pattern with decreased step length and decreased stride length, but progressed to a step through pattern reporting less tightness with time. Positioned sitting up in chair with leg rest.    Follow Up Recommendations  SNF;Supervision/Assistance - 24 hour;Home health PT     Equipment Recommendations  Rolling walker with 5" wheels    Recommendations for Other Services       Precautions / Restrictions Precautions Precautions: Fall Precaution Comments: abdominal wounds, left knee is buckling  a little Restrictions Weight Bearing Restrictions: No    Mobility  Bed Mobility Overal bed mobility: Needs Assistance Bed Mobility: Sidelying to Sit     Supine to sit: Min guard;Supervision     General bed mobility comments: increased time  Transfers Overall transfer level: Needs assistance Equipment used: Rolling walker (2 wheeled) Transfers: Sit to/from Stand Sit to Stand: Min guard            Ambulation/Gait Ambulation/Gait assistance: Min assist;+2 safety/equipment Ambulation Distance (Feet): 300 Feet Assistive device: Rolling walker (2 wheeled) Gait Pattern/deviations: Step-to pattern;Step-through pattern;Decreased step length - left;Decreased stride length;Shuffle;Antalgic     General Gait Details: tolerated weight bear  throught the left, HR 139 max while  ambulating   Stairs            Wheelchair Mobility    Modified Rankin (Stroke Patients Only)       Balance                                            Cognition Arousal/Alertness: Awake/alert Behavior During Therapy: WFL for tasks assessed/performed Overall Cognitive Status: Within Functional Limits for tasks assessed                                        Exercises      General Comments        Pertinent Vitals/Pain Pain Assessment: 0-10 Pain Score: 8  Pain Location: left  medial knee, Pain Descriptors / Indicators: Grimacing;Operative site guarding Pain Intervention(s): Monitored during session;Repositioned    Home Living                      Prior Function            PT Goals (current goals can now be found in the care plan section)      Frequency    Min 3X/week      PT Plan      Co-evaluation              AM-PAC PT "6 Clicks" Daily Activity  Outcome Measure  Difficulty turning over in bed (including adjusting bedclothes, sheets and blankets)?: A Lot Difficulty moving from lying on back to sitting on the side  of the bed? : A Lot Difficulty sitting down on and standing up from a chair with arms (e.g., wheelchair, bedside commode, etc,.)?: A Lot Help needed moving to and from a bed to chair (including a wheelchair)?: A Lot Help needed walking in hospital room?: A Lot Help needed climbing 3-5 steps with a railing? : A Lot 6 Click Score: 12    End of Session Equipment Utilized During Treatment: Gait belt Activity Tolerance: Patient limited by fatigue Patient left: with call bell/phone within reach;in chair   PT Visit Diagnosis: Difficulty in walking, not elsewhere classified (R26.2);Muscle weakness (generalized) (M62.81)       Almond Lint, SPTA [facility] [place] [facility] [phone]  Reviewed and agree with above  [name]  PTA [facility]  Acute  Rehab Pager      [phone]

## 2017-08-24 NOTE — Progress Notes (Signed)
[facility] Progress Note  24 Days Post-Op  Subjective: CC: nausea Patient with persistent nausea that has worsened over the weekend. Not taking in much PO. Abdominal pain remains unchanged. Left knee pain is improved. VSS.   Objective: Vital signs in last 24 hours: Temp:  [98.2 F (36.8 C)-98.9 F (37.2 C)] 98.2 F (36.8 C) (10/29 0413) Pulse Rate:  [80-102] 85 (10/29 0600) Resp:  [11-21] 12 (10/29 0600) BP: (119-132)/(80-84) 119/83 (10/28 2000) SpO2:  [96 %-100 %] 100 % (10/29 0600) FiO2 (%):  [98 %] 98 % (10/28 2017) Weight:  [77 kg (169 lb 12.1 oz)] 77 kg (169 lb 12.1 oz) (10/29 0500) Last BM Date: 08/22/17  Intake/Output from previous day: 10/28 0701 - 10/29 0700 In: 800 [P.O.:240; I.V.:310; IV Piggyback:250] Out: 650 [Urine:400; Stool:250] Intake/Output this shift: No intake/output data recorded.  PE: Gen: Alert, NAD, pleasant Card: Regular rate and rhythm Pulm: Normal effort Abd: Soft, not distended, +BS; midline wound clean with good granulation tissue; lateral wound with exudate, foul smelling, no obvious feculent drainage, mesh visible.  Skin: warm and dry, no rashes  Psych: A&Ox3   Lab Results:   Recent Labs  08/24/17 0619  WBC 10.7*  HGB 10.5*  HCT 33.4*  PLT 644*   BMET  Recent Labs  08/22/17 0318 08/23/17 0500  NA 135 138  K 5.0 4.0  CL 104 106  CO2 21* 25  GLUCOSE 138* 102*  BUN 16 20  CREATININE 0.64 0.63  CALCIUM 9.7 9.4   PT/INR No results for input(s): LABPROT, INR in the last 72 hours. CMP     Component Value Date/Time   NA 138 08/23/2017 0500   K 4.0 08/23/2017 0500   CL 106 08/23/2017 0500   CO2 25 08/23/2017 0500   GLUCOSE 102 (H) 08/23/2017 0500   BUN 20 08/23/2017 0500   CREATININE 0.63 08/23/2017 0500   CREATININE 0.85 07/22/2013 1021   CALCIUM 9.4 08/23/2017 0500   PROT 6.4 (L) 08/20/2017 0542   ALBUMIN 2.5 (L) 08/20/2017 0542   AST 57 (H) 08/20/2017 0542   ALT 59 08/20/2017 0542   ALKPHOS 209  (H) 08/20/2017 0542   BILITOT 0.5 08/20/2017 0542   GFRNONAA >60 08/23/2017 0500   GFRAA >60 08/23/2017 0500   Lipase     Component Value Date/Time   LIPASE 27 09/15/2015 0934       Studies/Results: No results found.  Anti-infectives: Anti-infectives    Start     Dose/Rate Route Frequency Ordered Stop   08/11/17 1400  fluconazole (DIFLUCAN) IVPB 100 mg  Status:  Discontinued     100 mg 50 mL/hr over 60 Minutes Intravenous Every 24 hours 08/11/17 1312 08/16/17 1847   08/01/17 0600  metroNIDAZOLE (FLAGYL) IVPB 500 mg  Status:  Discontinued     500 mg 100 mL/hr over 60 Minutes Intravenous On call to O.R. 07/31/17 1656 07/31/17 1709   08/01/17 0600  ciprofloxacin (CIPRO) IVPB 400 mg  Status:  Discontinued     400 mg 200 mL/hr over 60 Minutes Intravenous On call to O.R. 07/31/17 1656 07/31/17 1709   07/31/17 0556  metroNIDAZOLE (FLAGYL) IVPB 500 mg     500 mg 100 mL/hr over 60 Minutes Intravenous On call to O.R. 07/31/17 0556 07/31/17 0840   07/31/17 0556  ciprofloxacin (CIPRO) IVPB 400 mg     400 mg 200 mL/hr over 60 Minutes Intravenous On call to O.R. 07/31/17 1610 07/31/17 0914   07/08/17 1600  fluconazole (DIFLUCAN) IVPB  100 mg     100 mg 50 mL/hr over 60 Minutes Intravenous Every 24 hours 07/08/17 0828 07/14/17 1710   07/01/17 1500  fluconazole (DIFLUCAN) IVPB 100 mg     100 mg 50 mL/hr over 60 Minutes Intravenous Every 24 hours 07/01/17 1401 07/07/17 1715   06/16/17 1200  piperacillin-tazobactam (ZOSYN) IVPB 3.375 g     3.375 g 12.5 mL/hr over 240 Minutes Intravenous Every 8 hours 06/16/17 1026 06/17/17 0004   06/08/17 1200  piperacillin-tazobactam (ZOSYN) IVPB 3.375 g  Status:  Discontinued     3.375 g 12.5 mL/hr over 240 Minutes Intravenous Every 8 hours 06/08/17 1056 06/16/17 1026   05/29/17 2200  ceFEPIme (MAXIPIME) 2 g in dextrose 5 % 50 mL IVPB  Status:  Discontinued     2 g 100 mL/hr over 30 Minutes Intravenous Every 8 hours 05/29/17 2031 06/03/17 0832    05/29/17 2100  metroNIDAZOLE (FLAGYL) IVPB 500 mg  Status:  Discontinued     500 mg 100 mL/hr over 60 Minutes Intravenous Every 8 hours 05/29/17 2029 06/03/17 0832   05/26/17 2200  ceFAZolin (ANCEF) IVPB 1 g/50 mL premix  Status:  Discontinued     1 g 100 mL/hr over 30 Minutes Intravenous Every 8 hours 05/26/17 1008 05/29/17 2023   05/25/17 1100  vancomycin (VANCOCIN) 1,250 mg in sodium chloride 0.9 % 250 mL IVPB  Status:  Discontinued     1,250 mg 166.7 mL/hr over 90 Minutes Intravenous Every 12 hours 05/25/17 0955 05/26/17 0946   05/22/17 1400  anidulafungin (ERAXIS) 100 mg in sodium chloride 0.9 % 100 mL IVPB     100 mg 78 mL/hr over 100 Minutes Intravenous Every 24 hours 05/21/17 1330 05/27/17 1740   05/21/17 1400  anidulafungin (ERAXIS) 200 mg in sodium chloride 0.9 % 200 mL IVPB     200 mg 78 mL/hr over 200 Minutes Intravenous  Once 05/21/17 1330 05/21/17 1940   05/15/17 1000  anidulafungin (ERAXIS) 100 mg in sodium chloride 0.9 % 100 mL IVPB  Status:  Discontinued     100 mg 78 mL/hr over 100 Minutes Intravenous Every 24 hours 05/14/17 0829 05/16/17 0901   05/15/17 1000  metroNIDAZOLE (FLAGYL) IVPB 500 mg  Status:  Discontinued     500 mg 100 mL/hr over 60 Minutes Intravenous Every 8 hours 05/15/17 0903 05/25/17 0938   05/14/17 0900  anidulafungin (ERAXIS) 200 mg in sodium chloride 0.9 % 200 mL IVPB     200 mg 78 mL/hr over 200 Minutes Intravenous  Once 05/14/17 0829 05/14/17 1325   05/13/17 1927  vancomycin (VANCOCIN) 1-5 GM/200ML-% IVPB    Comments:  Ward, Christa   : cabinet override      05/13/17 1927 05/14/17 0744   05/12/17 1400  ceFEPIme (MAXIPIME) 1 g in dextrose 5 % 50 mL IVPB     1 g 100 mL/hr over 30 Minutes Intravenous Every 8 hours 05/12/17 0939 05/26/17 2359   05/12/17 0900  vancomycin (VANCOCIN) IVPB 1000 mg/200 mL premix  Status:  Discontinued     1,000 mg 200 mL/hr over 60 Minutes Intravenous Every 12 hours 05/12/17 0750 05/16/17 0852   05/12/17 0830   aztreonam (AZACTAM) 2 GM IVPB     2 g 100 mL/hr over 30 Minutes Intravenous  Once 05/12/17 0800 05/12/17 0957   05/06/17 0916  vancomycin (VANCOCIN) IVPB 1000 mg/200 mL premix     1,000 mg 200 mL/hr over 60 Minutes Intravenous On call to O.R. 05/06/17 7829 05/06/17  1229       Assessment/Plan Acute respiratory failure/ post op HCAP pneumonia/post op pleural effusion - CCM/extubated 05/28/17 Malnutrition - PICC/TNA 05/14/17; albumin 2.3 and preablumin 13.3 on 10/22 Oral thrush  Hx of hypertension Hx of anemia Hx of anxiety Hx of hypothyroid Chronic pain medicine use - neck pain - Pt being followed by Palliative to assist with pain control. Tobacco use  Adrenal mass/hx of prior hemicolectomy/ileocolic anastomosis 4008 1. S/p laparoscopic converted to open right adrenalectomy, small bowel resection with anastomosis, placement of 20cm^2 wound vac, 05/06/17, Dr. Lurena Joiner [name] 2. Post op wound dehiscence - primary repair of small bowel anastomotic leak, abdominal closure of wound dehisence, 05/13/17, Luke [name] 3. S/p reopening of recent laparotomy, lysis of adhesions, small bowel resection, placement 200cm^2 negative pressure dressing, 05/14/17, Luke [name] 4. S/p reopening of recent laparotomy, creation of tube ileostomy, placement of 100cm2 negative pressure dressing, 05/17/17, Luke [name] 5. S/p abdominal wash out, placement of mesh for abdominal closure, placement of wound vac of 100cm^2, 05/20/17, Luke [name] 6. S/p Bronchoscopy 05/22/17 Dr. Simonne Maffucci EC fistula 06/13/17 - noted on exam  7. Laparoscopy, laparotomy, extensive enterolysis with removal of mesh (partially) in the right upper quadrant; resection of old anastomosis and ileotransverse anastomosis, 07/31/17,Dr.Matthew Hassell Done  - continue dressing changes - continue therapies - encourage PO intake, gradual weaning of TPN - increased reglan to 10 mg q8h for nausea and gut motility - remeron qhs to stimulate  appetite  - per family request ordered PO carafate - palliative care assisting in pain management - wife wanting to know if he could have some liquid pain medication (hycet or codeine?) to help wean off the IV pain medication  Left Knee pain- s/p steroid infection - no swelling, redness or signs of joint infection - heat/ice prn  FEN: Regular diet 08/14/17/cyclic TPN- started 67/6/19 - pharmacy adjusting electrolytes/Phos ID: Vancomycin 7/11- 05/25/17, Maixpime 7/17-7/31/18, Eraxis 7/19-05/27/17, Diflucan 9/5-9/14,&10/16-10/21/18; Cipro/ Flagyl pre-op 07/31/17 DVT: Lovenox, SCDs Foley: Out Follow up: Hassell Done  Plan: Continue to wean TPN gradually. Pain management per palliative care. Encourage PO intake.   LOS: 110 days    Brigid Re , [facility] 08/24/2017, 8:09 AM Pager: [phone] Consults: [phone] Mon-Fri 7:00 am-4:30 pm Sat-Sun 7:00 am-11:30 am

## 2017-08-24 NOTE — Progress Notes (Signed)
Patient refused 10am oral meds. States  Throat is a little sore and he has slight nausea.Walked in hall with physical therapy and is sitting in chair.Right flank dressing changed with small amount tan drainage on old dressing.

## 2017-08-24 NOTE — Progress Notes (Signed)
[facility] NOTE   Pharmacy Consult for TPN Indication: prolonged ileus, small bowel leak, multiple bowel surgeries  Patient Measurements: Body mass index is 24.36 kg/m. Filed Weights   08/22/17 0500 08/23/17 0500 08/24/17 0500  Weight: 168 lb 10.4 oz (76.5 kg) 169 lb 5 oz (76.8 kg) 169 lb 12.1 oz (77 kg)   HPI: 26 yoM admitted on 7/11 for enlarging adrenal mass and planned adrenalectomy.  Pharmacy consulted to dose TPN.  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   7/22 OR:  wash out, tube ileostomy, left with open abdomen & wound vac in place.  Plan for OR on 7/25 for closure 7/25 OR: mesh placed for abdominal closure, wound vac 8/2 extubated, propofol drip off 8/4 TPN off for ~ 5 hours due to line detached from filter 8/10 MD request decrease rate x 1 week 2nd low sodium 8/12 Additional sodium added to TPN due to persistent hyponatremia 8/16 Increasing rate back to goal since sodium has improved with above intervention.   8/20 Prealbumin decreased.  New apparent enteric leak per surgery notes. 8/21 CaPhos product elevated, remove electrolytes from TPN 8/25 PICC did not have blood return in 2 of the 3 ports.  Alteplase intra-catheter and function returned.  TPN infusing correctly on 8/26 per RN. 8/28: d/c SSI 8/30: added electrolytes to TPN 8/31: TPN IV line came out around ~0340 and current bag was stopped at this time.  Restart new clinimix 5/15 1L bag (no lytes since phos is elevated this morning) until next bag is due at [facility] Will not add MVI and trace elements to this 1L bag.  Use electrolyte free clinimix with new bag at [facility] 9/1 NGT out 9/3 added electrolytes to TPN 9/4 lytes out of TPN 9/7 cycle over 18 hrs 9/8 add back electrolytes to TPN.  Cycle over 14 hours 9/9 lytes out of TPN, continue 14 hr cycle, DC SSI/CBGs 9/13 electrolytes added back to TPN 9/14 remove electrolytes from TPN 10/3 Adjusted estimated needs based on prealbumin (decreased on 9/24, low but slightly improved on 10/1) and attempt to maximize nutrition prior to surgery planned for 07/31/17 - required increase of cycle to 16 hours. 10/5 to OR for fistula-repair surgery 10/6: Lytes added back to TPN with low Phos 10/10 D/C CBGs 10/14 NGT clamp trial 10/16: CLD ordered.  Per RN notes patient tolerated liquids, ice chips, small amnt jello 10/17: NGT removed 10/19: Diet advanced to Regular diet  10/21: Pharmacy received consult to wean TPN off.   10/22: Clarified with Dr. Rosendo Gros to gradually decrease TPN over next 2-days with plans to stop by mid-week 10/23: Oral intake remains <= 25% of needs. Refer to dietician note for details.  No anti-emetic use today 10/25  Further weaning to 1L TPN per surgery's request in hopes to increase appetite.  Pt reports eating bfast today (egg, french toast, sausage) without nausea, but vomited with pills this morning.  Increase dietary supplements and Prostat and encouraged to take small sips continuously throughout the day.  Pharmacy to continue to replace electrolytes IV. 10/26 Patient with better appetite this am, ate all oatmeal and [place] sausage!  No complaints other  than feeling full.  Took oral meds.  PICC slid out some, CXR reveal PICC over SVC 10/27 patient's intake remains below goal but is improving.  Emphasized taking supplements. Issues with oral meds (cause nausea) and problems swallowing multivitamin 10/28 patient woke up more nauseated.  Has not eaten today 10/29 Pt with increased nausea. Poor PO intake. Spoke to [employer]. Was hoping to decrease  rate today, but because of poor PO intake and nausea, keep at 1L for at least today and the reevaluate in AM   Insulin requirements past 24 hours: none  Current Nutrition: regular diet started 10/19 Supplements:  Resource breeze BID, Ensure BID, Magic Cup daily (dietary orders only)  IVF: none  Central access: PICC line ordered 7/19, placed on 7/20 TPN start date: 7/20  ASSESSMENT                                                                                                           Today, 08/24/17  - Glucose:  (goal 100-150) At goal.  SSI/CBGs discontinued 10/10. - Electrolytes:  K = 4 w/o hemolysis    Mag 1.7  - daily IV replacement   Phos 4.2- has been variable, lytes removed from TPN to avoid high Phos levels  Na WNL since adding Na to electrolyte free TPN.  Continue at ~ 0.225% Na equivalence.     Corr Ca 10.8; Ca/phos product 49.5  (goal < 55) - Renal:  SCr wnl, stable - NGT removed. Using some PRN promethazine.  Zofranhas been stopped. Pt reports emesis after taking PO meds.  Encouraged use of PRNs. Reglan increased to 10 mg PO q8h. Also started sucralfate AC/HS - LFTs:  ALT 156, AST 75 and Alk Phos 225 elevated and increased (10/29) - TGs:  Slightly elevated prior to starting TPN, but now WNL (157 on 10/22) - Prealbumin: Low on admission but trended up and remained at or near goal during Aug to Sep;  (15.6 on 10/15 decreased to 13.3 on 10/22).  33 on 10/29  NUTRITIONAL GOALS                                                                                             RD recs (10/24):  137-153 gms Protein (1.7 - 1.9 g/kg), 2030 - 2270 Kcal (25-28 kcal/kg)  Recalculation (10/24): Clinimix 5/15 1000 mL/day with lipids 3x/week on MWF only would provide: 50g protein (36% minimum estimated protein need) and 870 kcal (43% minimum estimated kcal need) - Ensure Enlive BID (each: 350 kcal and 20 g protein) - Boost Breeze BID (each: 250 kcal and 9 g protein) - Magic Cup daily  (each: 290 kcal and 9 g protein).  PLAN                                                                                                                         Now:  Mag 4g IV x 1  KCl x 3 runs  Prefer to replace by oral route but patient states tabs make him nauseous  Pharmacy to continue IV electrolyte replacement while on TPN and until more consistently tolerating PO intake.  At 1800 today:  Continue TPN Clinimix 5/15 (electrolyte free) 1000 mL/day cycled over 12 hours.  50 ml/hr x 1 hr (6p-7p), 90 ml/hr x 10hr (7p-5a), 50 ml/hr x 1 hr (5a-6a), then off  Sodium added to Clinimix to keep equivalent to ~ 0.225% Na:  adding 19 mEq NaCl and 19 mEq Na Acetate in the 1L TPN bag   Lipids at 10 mls/hr x 12 hours 3 times weekly on MWF TPN to contain standard multivitamins and trace elements.  Continue PO MVI order, but he is currently refusing PO.  TPN lab panels on Mondays & Thursdays.   BMET, Mag & Phos daily ordered.  Follow up patient's oral intake. Will continue 1L TPN until enteral intake is meeting >60% nutrition goals or otherwise requested by surgery.   Royetta Asal, PharmD, BCPS Pager [phone] 08/24/2017 9:51 AM

## 2017-08-25 ENCOUNTER — Encounter (HOSPITAL_COMMUNITY): Payer: Self-pay | Admitting: Physician Assistant

## 2017-08-25 ENCOUNTER — Inpatient Hospital Stay (HOSPITAL_COMMUNITY): Payer: Medicare Other

## 2017-08-25 DIAGNOSIS — I493 Ventricular premature depolarization: Secondary | ICD-10-CM

## 2017-08-25 LAB — CBC
HEMATOCRIT: 36.6 % — AB (ref 39.0–52.0)
HEMOGLOBIN: 11.9 g/dL — AB (ref 13.0–17.0)
MCH: 24.8 pg — AB (ref 26.0–34.0)
MCHC: 32.5 g/dL (ref 30.0–36.0)
MCV: 76.4 fL — AB (ref 78.0–100.0)
Platelets: 630 10*3/uL — ABNORMAL HIGH (ref 150–400)
RBC: 4.79 MIL/uL (ref 4.22–5.81)
RDW: 19.7 % — ABNORMAL HIGH (ref 11.5–15.5)
WBC: 13.1 10*3/uL — ABNORMAL HIGH (ref 4.0–10.5)

## 2017-08-25 LAB — BASIC METABOLIC PANEL
ANION GAP: 13 (ref 5–15)
BUN: 20 mg/dL (ref 6–20)
CHLORIDE: 105 mmol/L (ref 101–111)
CO2: 20 mmol/L — AB (ref 22–32)
Calcium: 10.1 mg/dL (ref 8.9–10.3)
Creatinine, Ser: 0.71 mg/dL (ref 0.61–1.24)
GFR calc Af Amer: >60 mL/min (ref >60)
GFR calc non Af Amer: >60 mL/min (ref >60)
GLUCOSE: 96 mg/dL (ref 65–99)
POTASSIUM: 4.3 mmol/L (ref 3.5–5.1)
Sodium: 138 mmol/L (ref 135–145)

## 2017-08-25 LAB — PHOSPHORUS: Phosphorus: 5.3 mg/dL — ABNORMAL HIGH (ref 2.5–4.6)

## 2017-08-25 LAB — MAGNESIUM: MAGNESIUM: 1.7 mg/dL (ref 1.7–2.4)

## 2017-08-25 MED ORDER — IOPAMIDOL (ISOVUE-300) INJECTION 61%
100.0000 mL | Freq: Once | INTRAVENOUS | Status: AC | PRN
  Administered 2017-08-25: 100 mL via INTRAVENOUS

## 2017-08-25 MED ORDER — SODIUM CHLORIDE 0.9 % IV SOLN
200.0000 mg | INTRAVENOUS | Status: DC
  Administered 2017-08-25 – 2017-09-03 (×10): 200 mg via INTRAVENOUS
  Filled 2017-08-25 (×12): qty 200

## 2017-08-25 MED ORDER — IOPAMIDOL (ISOVUE-300) INJECTION 61%
INTRAVENOUS | Status: AC
  Filled 2017-08-25: qty 30

## 2017-08-25 MED ORDER — ONDANSETRON HCL 4 MG/2ML IJ SOLN
4.0000 mg | Freq: Three times a day (TID) | INTRAMUSCULAR | Status: DC
  Administered 2017-08-25 – 2017-09-04 (×28): 4 mg via INTRAVENOUS
  Filled 2017-08-25 (×28): qty 2

## 2017-08-25 MED ORDER — IOPAMIDOL (ISOVUE-300) INJECTION 61%: 15.0000 mL | Freq: Once | INTRAVENOUS | Status: DC | PRN

## 2017-08-25 MED ORDER — SCOPOLAMINE 1 MG/3DAYS TD PT72
1.0000 | MEDICATED_PATCH | TRANSDERMAL | Status: DC
  Administered 2017-08-25 – 2017-09-03 (×4): 1.5 mg via TRANSDERMAL
  Filled 2017-08-25 (×4): qty 1

## 2017-08-25 MED ORDER — IOPAMIDOL (ISOVUE-300) INJECTION 61%
INTRAVENOUS | Status: AC
  Filled 2017-08-25: qty 100

## 2017-08-25 MED ORDER — METOPROLOL TARTRATE 5 MG/5ML IV SOLN
2.5000 mg | Freq: Four times a day (QID) | INTRAVENOUS | Status: DC
  Administered 2017-08-25 – 2017-09-01 (×28): 2.5 mg via INTRAVENOUS
  Filled 2017-08-25 (×28): qty 5

## 2017-08-25 MED ORDER — CLONIDINE HCL 0.1 MG/24HR TD PTWK
0.1000 mg | MEDICATED_PATCH | TRANSDERMAL | Status: DC
  Administered 2017-08-25 – 2017-09-01 (×2): 0.1 mg via TRANSDERMAL
  Filled 2017-08-25 (×2): qty 1

## 2017-08-25 NOTE — Progress Notes (Signed)
Palliative Care Follow-up  [name] [name] has used significantly less usage of his PCA, but unfortunately he looks worse from a nausea stamdpoint. Episode of vomiting last PM. Sore throat. Possible that this is from esophageal thrush and he continues to have worsening signs of oral and pharyngeal thrush. He is refusing PO meds entirely-but took carafate which his wife requested-carafate can make nausea worse and could also lead to constipation and other problems with bowel motility and function-I would not recommend continuing this. He isn't taking his oral BP medication. Will place a low dose clonidine patch and give metoprolol 2.5 q6 hours to control his heart rate -he is tachycardic on my exam.  I have placed a pharmacy consult for antifungal recommendation- we should aggressively treat his thrush which is contributing to his nausea and poor PO intake.  Time: 35 min Greater than 50%  of this time was spent counseling and coordinating care related to the above assessment and plan.  Lane Hacker, DO Palliative Medicine

## 2017-08-25 NOTE — Progress Notes (Signed)
[facility]/Trauma Progress Note  25 Days Post-Op   Assessment/Plan Acute respiratory failure/ post op HCAP pneumonia/post op pleural effusion - CCM/extubated 05/28/17 Malnutrition - PICC/TNA 05/14/17; albumin 2.3 and preablumin 13.3 on 10/22 Oral thrush  Hx of hypertension Hx of anemia Hx of anxiety Hx of hypothyroid Chronic pain medicine use - neck pain - Pt being followed by Palliative to assist with pain control. Tobacco use  Adrenal mass/hx of prior hemicolectomy/ileocolic anastomosis 8101 1. S/p laparoscopic converted to open right adrenalectomy, small bowel resection with anastomosis, placement of 20cm^2 wound vac, 05/06/17, Dr. Lurena Joiner [name] 2. Post op wound dehiscence - primary repair of small bowel anastomotic leak, abdominal closure of wound dehisence, 05/13/17, Luke [name] 3. S/p reopening of recent laparotomy, lysis of adhesions, small bowel resection, placement 200cm^2 negative pressure dressing, 05/14/17, Luke [name] 4. S/p reopening of recent laparotomy, creation of tube ileostomy, placement of 100cm2 negative pressure dressing, 05/17/17, Luke [name] 5. S/p abdominal wash out, placement of mesh for abdominal closure, placement of wound vac of 100cm^2, 05/20/17, Luke [name] 6. S/p Bronchoscopy 05/22/17 Dr. Simonne Maffucci EC fistula 06/13/17 - noted on exam  7. Laparoscopy, laparotomy, extensive enterolysis with removal of mesh (partially) in the right upper quadrant; resection of old anastomosis and ileotransverse anastomosis, 07/31/17,Dr.Matthew Hassell Done  - continue dressing changes - continue therapies - encourage PO intake, off TPN, PICC removed - increased reglan to 10 mg q8h for nausea and gut motility - remeron qhs to stimulate appetite  - per family request ordered PO carafate - palliative care assisting in pain management - wife wanting to know if he could have some liquid pain medication (hycet or codeine?) to help wean off the IV pain  medication  Left Knee pain- s/p steroid infection - no swelling, redness or signs of joint infection - heat/ice prn  FEN: Regular diet 10/19; TPN stopped 10/29 ID: Vancomycin 7/11- 05/25/17, Maixpime 7/17-7/31/18, Eraxis 7/19-05/27/17, Diflucan 9/5-9/14,&10/16-10/21/18; Cipro/ Flagyl pre-op 07/31/17  NO ABX currenlty DVT: Lovenox, SCDs Foley: Out Follow up: Hassell Done  Plan: Pain management per palliative care. Encourage PO intake. Increased nausea and abd pain, CT pending. Increase in WBC - chest xray pending    LOS: 111 days    Subjective: CC: abdominal pain, nausea, one episode of vomiting last night  No appetite. Slight increase in abdominal pain. Constant nausea. Still having BM's and flatus.   Objective: Vital signs in last 24 hours: Temp:  [97.3 F (36.3 C)-98.5 F (36.9 C)] 98 F (36.7 C) (10/30 0318) Pulse Rate:  [98-125] 116 (10/30 0600) Resp:  [14-98] 19 (10/30 0800) BP: (119-129)/(80-92) 124/88 (10/30 0400) SpO2:  [14 %-100 %] 100 % (10/30 0800) FiO2 (%):  [97 %-99 %] 97 % (10/30 0400) Weight:  [164 lb 7.4 oz (74.6 kg)] 164 lb 7.4 oz (74.6 kg) (10/30 0500) Last BM Date: 08/24/17  Intake/Output from previous day: 10/29 0701 - 10/30 0700 In: 460 [P.O.:150; I.V.:60; IV Piggyback:250] Out: 1225 [Urine:1225] Intake/Output this shift: No intake/output data recorded.  PE: Gen: Alert, NAD, pleasant Card: tachycardic, Regular rhythm Pulm: CTA, rate and effort normal Abd: Soft, not distended, +BS; midline wound clean with good granulation tissue; lateral wound with exudate, foul smelling, no obvious feculent drainage, mesh visible.  Skin: warm and dry, no rashes  Psych: A&Ox3         Anti-infectives: Anti-infectives    Start     Dose/Rate Route Frequency Ordered Stop   08/11/17 1400  fluconazole (DIFLUCAN) IVPB 100 mg  Status:  Discontinued  100 mg 50 mL/hr over 60 Minutes Intravenous Every 24 hours 08/11/17 1312 08/16/17 1847   08/01/17 0600   metroNIDAZOLE (FLAGYL) IVPB 500 mg  Status:  Discontinued     500 mg 100 mL/hr over 60 Minutes Intravenous On call to O.R. 07/31/17 1656 07/31/17 1709   08/01/17 0600  ciprofloxacin (CIPRO) IVPB 400 mg  Status:  Discontinued     400 mg 200 mL/hr over 60 Minutes Intravenous On call to O.R. 07/31/17 1656 07/31/17 1709   07/31/17 0556  metroNIDAZOLE (FLAGYL) IVPB 500 mg     500 mg 100 mL/hr over 60 Minutes Intravenous On call to O.R. 07/31/17 0556 07/31/17 0840   07/31/17 0556  ciprofloxacin (CIPRO) IVPB 400 mg     400 mg 200 mL/hr over 60 Minutes Intravenous On call to O.R. 07/31/17 0556 07/31/17 0914   07/08/17 1600  fluconazole (DIFLUCAN) IVPB 100 mg     100 mg 50 mL/hr over 60 Minutes Intravenous Every 24 hours 07/08/17 0828 07/14/17 1710   07/01/17 1500  fluconazole (DIFLUCAN) IVPB 100 mg     100 mg 50 mL/hr over 60 Minutes Intravenous Every 24 hours 07/01/17 1401 07/07/17 1715   06/16/17 1200  piperacillin-tazobactam (ZOSYN) IVPB 3.375 g     3.375 g 12.5 mL/hr over 240 Minutes Intravenous Every 8 hours 06/16/17 1026 06/17/17 0004   06/08/17 1200  piperacillin-tazobactam (ZOSYN) IVPB 3.375 g  Status:  Discontinued     3.375 g 12.5 mL/hr over 240 Minutes Intravenous Every 8 hours 06/08/17 1056 06/16/17 1026   05/29/17 2200  ceFEPIme (MAXIPIME) 2 g in dextrose 5 % 50 mL IVPB  Status:  Discontinued     2 g 100 mL/hr over 30 Minutes Intravenous Every 8 hours 05/29/17 2031 06/03/17 0832   05/29/17 2100  metroNIDAZOLE (FLAGYL) IVPB 500 mg  Status:  Discontinued     500 mg 100 mL/hr over 60 Minutes Intravenous Every 8 hours 05/29/17 2029 06/03/17 0832   05/26/17 2200  ceFAZolin (ANCEF) IVPB 1 g/50 mL premix  Status:  Discontinued     1 g 100 mL/hr over 30 Minutes Intravenous Every 8 hours 05/26/17 1008 05/29/17 2023   05/25/17 1100  vancomycin (VANCOCIN) 1,250 mg in sodium chloride 0.9 % 250 mL IVPB  Status:  Discontinued     1,250 mg 166.7 mL/hr over 90 Minutes Intravenous Every 12  hours 05/25/17 0955 05/26/17 0946   05/22/17 1400  anidulafungin (ERAXIS) 100 mg in sodium chloride 0.9 % 100 mL IVPB     100 mg 78 mL/hr over 100 Minutes Intravenous Every 24 hours 05/21/17 1330 05/27/17 1740   05/21/17 1400  anidulafungin (ERAXIS) 200 mg in sodium chloride 0.9 % 200 mL IVPB     200 mg 78 mL/hr over 200 Minutes Intravenous  Once 05/21/17 1330 05/21/17 1940   05/15/17 1000  anidulafungin (ERAXIS) 100 mg in sodium chloride 0.9 % 100 mL IVPB  Status:  Discontinued     100 mg 78 mL/hr over 100 Minutes Intravenous Every 24 hours 05/14/17 0829 05/16/17 0901   05/15/17 1000  metroNIDAZOLE (FLAGYL) IVPB 500 mg  Status:  Discontinued     500 mg 100 mL/hr over 60 Minutes Intravenous Every 8 hours 05/15/17 0903 05/25/17 0938   05/14/17 0900  anidulafungin (ERAXIS) 200 mg in sodium chloride 0.9 % 200 mL IVPB     200 mg 78 mL/hr over 200 Minutes Intravenous  Once 05/14/17 0829 05/14/17 1325   05/13/17 1927  vancomycin (VANCOCIN) 1-5 GM/200ML-%  IVPB    Comments:  Ward, Christa   : cabinet override      05/13/17 1927 05/14/17 0744   05/12/17 1400  ceFEPIme (MAXIPIME) 1 g in dextrose 5 % 50 mL IVPB     1 g 100 mL/hr over 30 Minutes Intravenous Every 8 hours 05/12/17 0939 05/26/17 2359   05/12/17 0900  vancomycin (VANCOCIN) IVPB 1000 mg/200 mL premix  Status:  Discontinued     1,000 mg 200 mL/hr over 60 Minutes Intravenous Every 12 hours 05/12/17 0750 05/16/17 0852   05/12/17 0830  aztreonam (AZACTAM) 2 GM IVPB     2 g 100 mL/hr over 30 Minutes Intravenous  Once 05/12/17 0800 05/12/17 0957   05/06/17 0916  vancomycin (VANCOCIN) IVPB 1000 mg/200 mL premix     1,000 mg 200 mL/hr over 60 Minutes Intravenous On call to O.R. 05/06/17 0916 05/06/17 1229      Lab Results:   Recent Labs  08/24/17 0619 08/25/17 0314  WBC 10.7* 13.1*  HGB 10.5* 11.9*  HCT 33.4* 36.6*  PLT 644* 630*   BMET  Recent Labs  08/24/17 0619 08/25/17 0314  NA 138 138  K 4.3 4.3  CL 104 105  CO2 24  20*  GLUCOSE 91 96  BUN 21* 20  CREATININE 0.69 0.71  CALCIUM 9.9 10.1   PT/INR No results for input(s): LABPROT, INR in the last 72 hours. CMP     Component Value Date/Time   NA 138 08/25/2017 0314   K 4.3 08/25/2017 0314   CL 105 08/25/2017 0314   CO2 20 (L) 08/25/2017 0314   GLUCOSE 96 08/25/2017 0314   BUN 20 08/25/2017 0314   CREATININE 0.71 08/25/2017 0314   CREATININE 0.85 07/22/2013 1021   CALCIUM 10.1 08/25/2017 0314   PROT 8.1 08/24/2017 0619   ALBUMIN 3.3 (L) 08/24/2017 0619   AST 75 (H) 08/24/2017 0619   ALT 156 (H) 08/24/2017 0619   ALKPHOS 225 (H) 08/24/2017 0619   BILITOT 0.5 08/24/2017 0619   GFRNONAA >60 08/25/2017 0314   GFRAA >60 08/25/2017 0314   Lipase     Component Value Date/Time   LIPASE 27 09/15/2015 0934    Studies/Results: No results found.    Kalman Drape , [facility] Surgery 08/25/2017, 8:09 AM Pager: [phone] Consults: [phone] Mon-Fri 7:00 am-4:30 pm Sat-Sun 7:00 am-11:30 am

## 2017-08-25 NOTE — Progress Notes (Signed)
Pharmacy Antibiotic Note  [name] [name] is a 56 y.o. male admitted on 05/06/2017 with severe esophageal and oropharyngeal canidiasis.  Pharmacy has been consulted for Eraxis dosing.  Plan: Eraxis 200 mg IV q24h   - Will dose at higher dose as per IDSA guidelines as there is concern that pt may have azole resistant candidiasis as pt has received fluconazole during stay. Per guidelines, recommended LOT for azole resistant esophageal candidiasis is 14-21 days.   Height: 5\' 10"  (177.8 cm) Weight: 164 lb 7.4 oz (74.6 kg) IBW/kg (Calculated) : 73  Temp (24hrs), Avg:97.8 F (36.6 C), Min:97.3 F (36.3 C), Max:98.5 F (36.9 C)   Recent Labs Lab 08/21/17 0533 08/22/17 0318 08/23/17 0500 08/24/17 0619 08/25/17 0314  WBC  --   --   --  10.7* 13.1*  CREATININE 0.64 0.64 0.63 0.69 0.71    Estimated Creatinine Clearance: 106.5 mL/min (by C-G formula based on SCr of 0.71 mg/dL).    Allergies  Allergen Reactions  . Chantix [Varenicline] Palpitations    Heart race Increased panic attacks  . Morphine Itching  . Penicillins Rash    Has patient had a PCN reaction causing immediate rash, facial/tongue/throat swelling, SOB or lightheadedness with hypotension: Yes Has patient had a PCN reaction causing severe rash involving mucus membranes or skin necrosis: Unknown Has patient had a PCN reaction that required hospitalization: No Has patient had a PCN reaction occurring within the last 10 years: No If all of the above answers are "NO", then may proceed with Cephalosporin use. Tolerating Zosyn 05/2017     Antimicrobials this admission: 7/17 Vancomcyin >> 7/21, resume 7/30 >> 7/31 7/17 Cefepime >> 7/31, resume 8/3 >> 8/8 7/19 Anidulafungin >> 7/21, resume 7/26 >> 8/1 7/20 metronidazole >> 7/30, resume 8/3 >> 8/8 7/31 Cefazolin >> 8/3 8/13 Zosyn >> 8/21 8/22 Nystatin for thrush>>9/4; 10/15>> 10/16 9/4 Diflucan (esophageal candidiasis) >> 9/18, 10/16 >> 10/21 10/30 anidulafungin >>     Dose adjustments this admission: ---  Microbiology results: 7/11 MRSA PCR: negative 7/18 Abdominal culture:  moderate Klebsiella pna - Pan sens except amp 7/19 BCx: NGF 7/22 Trach asp: multple orgs, none predominant 7/27 Trach asp: rare MSSA 7/27 BCx: 1/2 CoNS 8/4 perihepatic fluid: NGF 8/16 MRSA PCR: negative 10/29 Fungus culture ( throat ):    Thank you for allowing pharmacy to be a part of this patient's care.   Royetta Asal, PharmD, BCPS Pager [phone] 08/25/2017 10:59 AM

## 2017-08-25 NOTE — Consult Note (Signed)
Cardiology Consultation:   Patient ID: [name] [name]; [id number]; 1961/08/09   Admit date: 05/06/2017 Date of Consult: 08/25/2017  Primary Care Provider: Lujean Amel, MD Primary Cardiologist: New to Dr. Radford Pax  Chief Complaint: nausea  Patient Profile:   [name] [name] is a 56 y.o. male with a hx of Adrenal mass, HTN, anemia, anxiety, hypothyroidism, chronic neck pain on chronic opiates, tobacco abuse who is being seen today for the evaluation of PVCs at the request of Dani Gobble PA-C. He has been admitted since 05/06/17.  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 This was complicated by small bowel injury requiring resection and anastomosis and an anastomotic leak requiring repair on 7/18. Subsequent hospital course notable for wounld dehiscence, respiratory failure requiring intubation, peritonitis, MSSA HCAP, severe agitation/pain requiring sedative drips, malnutrition, thrush, ABL anemia, metabolic encephaloapthy, thrombocytosis. 2D echo obtained for sinus tach 06/08/17 showed EF 55-60%, grade 1 DD, no RWMA, mild proximal septal thickening. Last surgery was laparoscopy, laparotomy, extensive enterolysis with removal of mesh (partially) in the right upper quadrant; resection of old anastomosis and ileotransverse anastomosis on 07/31/17. On TPN for nutrition, intermittently refusing medicines due to nausea and thrush. Palliative is also following for chronic pain issues. Intermittent PVCs have been noted on his telemetry, most recently on 10/25-10/26. He has remained in NSR/sinus tach since that time. He denies any prior cardiac history or family history of heart disease. Most recent K 4.3, Mg 1.7, increase in LFTs to 75/156, WBC 13k, Hgb 11.9, Cr 0.71. He is on clonidine patch for BP. He was started on Lopressor q6hr by palliative medicine for sinus tach. He denies any CP, SOB, palpitations or  syncope.  Past Medical History:  Diagnosis Date  . Anemia   . Anxiety   . Arthritis    neck, shoulder, left knee  . 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 Erythrocytosis 11/30/2014  . Essential hypertension   . GERD (gastroesophageal reflux disease)   . Headache(784.0)    2 x/week  . History of kidney stones   . Hypothyroidism    hx of   . Leukocytosis, unspecified 11/03/2013  . Limited joint range of motion    cervical fusion 02/2013  . Medial meniscus tear 06/2013   left knee  . 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 Thyroid nodule   . Tobacco abuse   . Wears partial dentures    upper    Past Surgical History:  Procedure Laterality Date  . ANTERIOR CERVICAL DECOMP/DISCECTOMY FUSION N/A 03/18/2013   Procedure: ANTERIOR CERVICAL DECOMPRESSION/DISCECTOMY FUSION 3 LEVELS;  Surgeon: Erline Levine, MD;  Location: [facility];  Service: Neurosurgery;  Laterality: N/A;  Anterior Cervical Three-Six  Decompression/Diskectomy/Fusion  . APPENDECTOMY    . CIRCUMCISION  05/04/2006  . COLONOSCOPY    . EYE SURGERY     bilateral cataract surgery   . HEMICOLECTOMY Right 01/15/253   with ileocolic anastomosis  . INGUINAL HERNIA REPAIR    . INSERTION OF MESH N/A 07/31/2017   Procedure: INSERTION OF MESH;  Surgeon: Johnathan Hausen, MD;  Location: [facility];  Service: General;  Laterality: N/A;  . KNEE ARTHROSCOPY Left 08/01/2013   Procedure: LEFT ARTHROSCOPY KNEE;  Surgeon: Kerin Salen, MD;  Location: [facility];  Service: Orthopedics;  Laterality: Left;  . LAPAROSCOPIC ADRENALECTOMY Right 05/06/2017   Procedure: OPEN RIGHT ADRENALECTOMY WITH SMALL BOWEL RESECTION AND  ANASTOMOSIS;  Surgeon: Kieth Brightly, Arta Bruce, MD;  Location: [facility];  Service: General;  Laterality: Right;  . LAPAROSCOPIC LYSIS OF ADHESIONS N/A 07/31/2017   Procedure: LYSIS OF ADHESIONS;  Surgeon: Johnathan Hausen, MD;  Location: [facility];  Service: General;  Laterality: N/A;  .  LAPAROSCOPY N/A 07/31/2017   Procedure: LAPAROSCOPY DIAGNOSTIC;  Surgeon: Johnathan Hausen, MD;  Location: [facility];  Service: General;  Laterality: N/A;  . LAPAROTOMY N/A 05/13/2017   Procedure: EXPLORATORY LAPAROTOMY, with wound dehisense, and repair of anastamotic leak;  Surgeon: Mickeal Skinner, MD;  Location: [facility];  Service: General;  Laterality: N/A;  . LAPAROTOMY N/A 05/14/2017   Procedure: EXPLORATORY LAPAROTOMY, LYSIS OF ADHESIONS/ SMALL BOWEL RESECTION/ WASHOUT OF ABDOMEN AND PLACEMENT OF NEGATIVE PRESSURE DRESSING;  Surgeon: Kieth Brightly, Arta Bruce, MD;  Location: [facility];  Service: General;  Laterality: N/A;  . LAPAROTOMY N/A 05/17/2017   Procedure: EXPLORATORY LAPAROTOMY, PLACEMENT OF ILEOSTOMY TUBES, PARTIAL CLOSURE OF FASCIA, WOUND VAC EXCHANGE;  Surgeon: [name], Arta Bruce, MD;  Location: [facility];  Service: General;  Laterality: N/A;  . LAPAROTOMY N/A 05/20/2017   Procedure: ABDOMINAL WOUND [place] OUT, WOUND CLOSURE AND WOUND VAC PLACEMENT;  Surgeon: [name], Arta Bruce, MD;  Location: [facility];  Service: General;  Laterality: N/A;  . LAPAROTOMY N/A 07/31/2017   Procedure: EXPLORATORY LAPAROTOMY WITH CLOSURE OF ENTEROTOMICS;  Surgeon: Johnathan Hausen, MD;  Location: [facility];  Service: General;  Laterality: N/A;  . SHOULDER ARTHROSCOPY W/ ROTATOR CUFF REPAIR Left 07/16/2010  . SMALL INTESTINE SURGERY  04/24/2000   adhesiolysis, ileocolic anastomosis  . UPPER GASTROINTESTINAL ENDOSCOPY       Inpatient Medications: Scheduled Meds: . cloNIDine  0.1 mg Transdermal Weekly  . diclofenac sodium  4 g Topical QID  . enoxaparin (LOVENOX) injection  40 mg Subcutaneous Q24H  . famotidine  40 mg Oral QHS  . feeding supplement  1 Container Oral BID BM  . feeding supplement (ENSURE ENLIVE)  237 mL Oral BID  . fentaNYL  175 mcg Transdermal Q72H  . fentaNYL   Intravenous Q4H  . iopamidol      . lidocaine  2 patch Transdermal Q24H  . LORazepam  1 mg Oral TID  . metoCLOPramide (REGLAN) injection   10 mg Intravenous Q8H  . metoprolol tartrate  2.5 mg Intravenous Q6H  . mirtazapine  15 mg Oral QHS  . multivitamin with minerals  1 tablet Oral Daily  . oxyCODONE-acetaminophen  2 tablet Oral TID  . promethazine  12.5 mg Oral TID AC  . psyllium  1 packet Oral Daily  . scopolamine  1 patch Transdermal Q72H   Continuous Infusions:  PRN Meds: alum & mag hydroxide-simeth, hydrocortisone, hydrocortisone cream, iopamidol, lip balm, LORazepam, LORazepam, ondansetron (ZOFRAN) IV, promethazine, simethicone, witch hazel-glycerin  Home Meds: Prior to Admission medications   Medication Sig Start Date End Date Taking? Authorizing Provider  acetaminophen (TYLENOL) 500 MG tablet Take 1,500 mg by mouth daily as needed for mild pain.    Yes [provider]  atorvastatin (LIPITOR) 10 MG tablet Take 10 mg by mouth daily.   Yes [provider]  chlorthalidone (HYGROTON) 25 MG tablet Take 12.5 mg by mouth daily.  02/11/17  Yes [provider]  cloNIDine (CATAPRES) 0.1 MG tablet Take 0.1 mg by mouth 2 (two) times daily. 04/08/17  Yes [provider]  gabapentin (NEURONTIN) 100 MG capsule Take 100 mg by mouth 2 (two) times daily as needed (nerve pain).  11/14/14  Yes [provider]  ibuprofen (ADVIL,MOTRIN) 200 MG tablet Take 600 mg by mouth daily as needed for headache. Reported on 03/18/2016   Yes [provider]  LORazepam (ATIVAN) 1 MG tablet Take 1 mg by mouth 2 (two) times daily as needed for anxiety.   Yes [provider]  methocarbamol (ROBAXIN) 500 MG tablet Take 1 tablet (500 mg total) by mouth 2 (two) times daily. Patient taking differently: Take 500 mg by mouth daily as needed for muscle spasms.  10/05/15  Yes Larene Pickett, PA-C  Multiple Vitamin (MULTIVITAMIN WITH MINERALS) TABS Take 1 tablet by mouth daily.   Yes [provider]  omeprazole (PRILOSEC) 40 MG capsule Take 1 capsule (40 mg total) by mouth 2 (two) times  daily. Patient taking differently: Take 40 mg by mouth daily.  07/22/13  Yes Theodis Blaze, MD  oxyCODONE-acetaminophen (PERCOCET) 10-325 MG per tablet Take 1-2 tablets by mouth every 6 (six) hours as needed for pain. 08/25/13  Yes Katheren Shams, MD  Potassium Chloride ER 20 MEQ TBCR Take 20 mEq by mouth daily. 02/18/17  Yes [provider]  sildenafil (VIAGRA) 100 MG tablet Take 100 mg by mouth daily as needed for erectile dysfunction.   Yes [provider]  tadalafil (CIALIS) 20 MG tablet Take 20 mg by mouth daily as needed for erectile dysfunction.   Yes [provider]  valsartan (DIOVAN) 80 MG tablet Take 80 mg by mouth daily. 03/03/17  Yes [provider]  labetalol (NORMODYNE) 100 MG tablet Take 0.5 tablets (50 mg total) by mouth 2 (two) times daily. Patient not taking: Reported on 04/28/2017 03/28/17   Renato Shin, MD    Allergies:    Allergies  Allergen Reactions  . Chantix [Varenicline] Palpitations    Heart race Increased panic attacks  . Morphine Itching  . Penicillins Rash    Has patient had a PCN reaction causing immediate rash, facial/tongue/throat swelling, SOB or lightheadedness with hypotension: Yes Has patient had a PCN reaction causing severe rash involving mucus membranes or skin necrosis: Unknown Has patient had a PCN reaction that required hospitalization: No Has patient had a PCN reaction occurring within the last 10 years: No If all of the above answers are "NO", then may proceed with Cephalosporin use. Tolerating Zosyn 05/2017     Social History:   Social History   Social History  . Marital status: Married    Spouse name: N/A  . Number of children: N/A  . Years of education: N/A   Occupational History  . Not on file.   Social History Main Topics  . Smoking status: Current Every Day Smoker    Packs/day: 1.00    Years: 25.00    Types: Cigarettes  . Smokeless tobacco: Never Used  . Alcohol use Yes     Comment:  occasionally  . Drug use: No  . Sexual activity: Not on file   Other Topics Concern  . Not on file   Social History Narrative   Lives in [place] with his wife.    Family History:   The patient's family history includes Cancer (age of onset: 17) in his sister; Cancer (age of onset: 107) in his father; Colon cancer in his father. There is no history of Thyroid disease.  ROS:  + mouth soreness, nausea, generalized weakness. All other ROS reviewed and negative.     Physical Exam/Data:   Vitals:   08/25/17 0400 08/25/17 0500 08/25/17 0600 08/25/17 0800  BP: 124/88  Pulse: (!) 101 (!) 108 (!) 116   Resp: 17 14 19 19   Temp:    97.9 F (36.6 C)  TempSrc:    Oral  SpO2: 92% 95% 96% 100%  Weight:  164 lb 7.4 oz (74.6 kg)    Height:        Intake/Output Summary (Last 24 hours) at 08/25/17 1033 Last data filed at 08/25/17 0600  Gross per 24 hour  Intake              240 ml  Output              825 ml  Net             -585 ml   Filed Weights   08/23/17 0500 08/24/17 0500 08/25/17 0500  Weight: 169 lb 5 oz (76.8 kg) 169 lb 12.1 oz (77 kg) 164 lb 7.4 oz (74.6 kg)   Body mass index is 23.6 kg/m.  General: Chronically ill appearing AAM in no acute distress. Head: Normocephalic, atraumatic, sclera non-icteric, no xanthomas, nares are without discharge.  Neck: Negative for carotid bruits. JVD not elevated. Lungs: Clear bilaterally to auscultation without wheezes, rales, or rhonchi. Breathing is unlabored. Heart. Mildly tachycardic with regular rhythm  with S1 S2. No murmurs, rubs, or gallops appreciated. Abdomen: Soft, non-tender, non-distended with normoactive bowel sounds. No hepatomegaly. No rebound/guarding. No obvious abdominal masses. Msk:  Strength and tone appear normal for age. Extremities: No clubbing or cyanosis. No edema.  Distal pedal pulses are 2+ and equal bilaterally. Neuro: Alert and oriented X 3. No facial asymmetry. No focal deficit. Moves all extremities  spontaneously. Psych:  Responds to questions appropriately with a normal affect.  EKG:  No recent EKG. EKG 07/11/17 showed NSR no acute changes, QTC 416ms  Relevant CV Studies: Ref above  Laboratory Data:  Chemistry Recent Labs Lab 08/23/17 0500 08/24/17 0619 08/25/17 0314  NA 138 138 138  K 4.0 4.3 4.3  CL 106 104 105  CO2 25 24 20*  GLUCOSE 102* 91 96  BUN 20 21* 20  CREATININE 0.63 0.69 0.71  CALCIUM 9.4 9.9 10.1  GFRNONAA >60 >60 >60  GFRAA >60 >60 >60  ANIONGAP 7 10 13      Recent Labs Lab 08/20/17 0542 08/24/17 0619  PROT 6.4* 8.1  ALBUMIN 2.5* 3.3*  AST 57* 75*  ALT 59 156*  ALKPHOS 209* 225*  BILITOT 0.5 0.5   Hematology Recent Labs Lab 08/24/17 0619 08/25/17 0314  WBC 10.7* 13.1*  RBC 4.36 4.79  HGB 10.5* 11.9*  HCT 33.4* 36.6*  MCV 76.6* 76.4*  MCH 24.1* 24.8*  MCHC 31.4 32.5  RDW 19.7* 19.7*  PLT 644* 630*   Cardiac EnzymesNo results for input(s): TROPONINI in the last 168 hours. No results for input(s): TROPIPOC in the last 168 hours.  BNPNo results for input(s): BNP, PROBNP in the last 168 hours.  DDimer No results for input(s): DDIMER in the last 168 hours.  Radiology/Studies:  Dg Chest Port 1 View  Result Date: 08/25/2017 CLINICAL DATA:  Elevated white blood cell count EXAM: PORTABLE CHEST 1 VIEW COMPARISON:  August 20, 2017 FINDINGS: No edema or consolidation. Heart size and pulmonary vascularity are normal. No adenopathy. There is aortic atherosclerosis. There is postoperative change in the lower cervical spine. Central catheter has been removed. No pneumothorax. IMPRESSION: No edema or consolidation.  There is aortic atherosclerosis. Aortic Atherosclerosis (ICD10-I70.0). Electronically Signed   By: Lowella Grip III M.D.   On: 08/25/2017  09:44    Assessment and Plan:   1. Adrenal mass s/p adrenalectomy with complicated post-hospital course as above - per primary teams.  2. Occasional PVCs - last noted on 10/25-10/26. Normal  LVEF by echo this admission. Do not suspect any acute cardiac issue, likely driven by underlying illness. Will repeat EKG now for updated baseline as last one was obtained 06/2017. It appears that palliative team has also ordered 12-lead to assess QTc while on antiemetics. This appears to be 470ms by telemetry. Would recommend primary teams keep K close to 4.0 or greater and magnesium 1.7 or greater. Given patient's tenuous and complicated course will defer method of electrolyte repletion to their discretion.  3. Sinus tach - given above medical issues, rising WBC and ongoing infection issues, this is likely physiologic.  Signed, Charlie Pitter, PA-C  08/25/2017 10:33 AM

## 2017-08-25 NOTE — Progress Notes (Signed)
Pt refusing PO contrast. Educated on purpose and need; continued to refuse. Jackson Latino, PA made aware. Will proceed with CT A/P with IV contrast per PA instruction. CT dept. notified.  Will continue to monitor pt closely.

## 2017-08-25 NOTE — Progress Notes (Signed)
Physical Therapy Treatment Patient Details Name: [name] [name] MRN: [id number] DOB: 01-16-1961 Today's Date: 08/25/2017    History of Present Illness  56 y.o. male admitted 7/11 with right adrenal mass , s/p  right adrenalectomy complicated by small bowel injury requiring resection with anastomosis . Has required multiple surgeris, the last being 10/5 for  Laparoscopy, laparotomy, extensive enterolysis with removal of mesh (partially) in the right upper quadrant; resection of old anastomosis and ileotransverse anastomosiss    PT Comments    Ambulated 100 ft with min guard. HR increased to 151 during ambulation with complaints of fatigue, fatigue diminished with rest. VC required for hand placement during sit to stand.    Follow Up Recommendations        Equipment Recommendations       Recommendations for Other Services       Precautions / Restrictions Precautions Precautions: Fall Precaution Comments: abdominal wounds, left knee is buckling  a little Restrictions Weight Bearing Restrictions: No    Mobility  Bed Mobility Overal bed mobility: Needs Assistance Bed Mobility: Sidelying to Sit     Supine to sit: Min guard;Supervision     General bed mobility comments: increased time needed  Transfers Overall transfer level: Needs assistance Equipment used: Rolling walker (2 wheeled) Transfers: Sit to/from Stand Sit to Stand: Min guard Stand pivot transfers: Min guard          Ambulation/Gait Ambulation/Gait assistance: Min assist;+2 safety/equipment Ambulation Distance (Feet): 100 Feet Assistive device: Rolling walker (2 wheeled) Gait Pattern/deviations: Step-to pattern;Step-through pattern;Decreased step length - left;Decreased stride length;Shuffle;Antalgic     General Gait Details: tolerated weight bear  throught the left, HR 151 max while ambulating   Stairs            Wheelchair Mobility    Modified Rankin (Stroke Patients Only)        Balance                                            Cognition Arousal/Alertness: Awake/alert Behavior During Therapy: WFL for tasks assessed/performed Overall Cognitive Status: Within Functional Limits for tasks assessed                                        Exercises      General Comments        Pertinent Vitals/Pain Pain Score: 8  Pain Location: left  medial knee, Pain Descriptors / Indicators: Grimacing;Operative site guarding Pain Intervention(s): Monitored during session;Repositioned    Home Living                      Prior Function            PT Goals (current goals can now be found in the care plan section)      Frequency    Min 3X/week      PT Plan      Co-evaluation PT/OT/SLP Co-Evaluation/Treatment: Yes            AM-PAC PT "6 Clicks" Daily Activity  Outcome Measure  Difficulty turning over in bed (including adjusting bedclothes, sheets and blankets)?: A Lot Difficulty moving from lying on back to sitting on the side of the bed? : A Lot Difficulty sitting down on and standing up from a  chair with arms (e.g., wheelchair, bedside commode, etc,.)?: A Lot Help needed moving to and from a bed to chair (including a wheelchair)?: A Lot Help needed walking in hospital room?: A Lot Help needed climbing 3-5 steps with a railing? : A Lot 6 Click Score: 12    End of Session Equipment Utilized During Treatment: Gait belt Activity Tolerance: Patient limited by fatigue Patient left: with call bell/phone within reach;in chair;with family/visitor present Nurse Communication: Mobility status PT Visit Diagnosis: Difficulty in walking, not elsewhere classified (R26.2);Muscle weakness (generalized) (M62.81)     Time: 1445-1510 PT Time Calculation (min) (ACUTE ONLY): 25 min  Charges:  $Gait Training: 8-22 mins $Therapeutic Activity: 8-22 mins                    G Codes:       Almond Lint,  SPTA [facility] [place]  PTA [facility]  Acute  Rehab Pager      [phone]

## 2017-08-26 LAB — BASIC METABOLIC PANEL
ANION GAP: 11 (ref 5–15)
BUN: 21 mg/dL — ABNORMAL HIGH (ref 6–20)
CHLORIDE: 109 mmol/L (ref 101–111)
CO2: 20 mmol/L — ABNORMAL LOW (ref 22–32)
Calcium: 10.1 mg/dL (ref 8.9–10.3)
Creatinine, Ser: 0.79 mg/dL (ref 0.61–1.24)
GFR calc non Af Amer: >60 mL/min (ref >60)
Glucose, Bld: 98 mg/dL (ref 65–99)
POTASSIUM: 4.6 mmol/L (ref 3.5–5.1)
SODIUM: 140 mmol/L (ref 135–145)

## 2017-08-26 MED ORDER — UNJURY CHICKEN SOUP POWDER
8.0000 [oz_av] | Freq: Every day | ORAL | Status: DC
  Administered 2017-08-26 – 2017-08-31 (×3): 8 [oz_av] via ORAL
  Filled 2017-08-26: qty 27

## 2017-08-26 NOTE — Progress Notes (Signed)
[facility] Progress Note  26 Days Post-Op  Subjective: CC: nausea Patient states nausea has improved since yesterday, but still present. No appetite. Pain is improved but worsens as the day goes on. Left knee is still occasionally painful, but actually feels better with movement and walking.   Objective: Vital signs in last 24 hours: Temp:  [97.9 F (36.6 C)-98.7 F (37.1 C)] 98.2 F (36.8 C) (10/30 2300) Pulse Rate:  [84-151] 102 (10/31 0400) Resp:  [13-19] 14 (10/31 0400) BP: (129-140)/(92-93) 129/93 (10/30 1200) SpO2:  [95 %-100 %] 98 % (10/31 0400) FiO2 (%):  [96 %-98 %] 98 % (10/31 0400) Weight:  [74.9 kg (165 lb 2 oz)] 74.9 kg (165 lb 2 oz) (10/31 0442) Last BM Date: 08/25/17  Intake/Output from previous day: 10/30 0701 - 10/31 0700 In: 350 [I.V.:90; IV Piggyback:260] Out: -  Intake/Output this shift: No intake/output data recorded.  PE: Gen: Alert, NAD, pleasant Card: Regular rate and rhythm Pulm: Normal effort Abd: Soft, not distended, +BS; midline wound clean with good granulation tissue; lateral wound with visible mesh, no purulence  Skin: warm and dry, no rashes  Psych: A&Ox3   Lab Results:   Recent Labs  08/24/17 0619 08/25/17 0314  WBC 10.7* 13.1*  HGB 10.5* 11.9*  HCT 33.4* 36.6*  PLT 644* 630*   BMET  Recent Labs  08/25/17 0314 08/26/17 0302  NA 138 140  K 4.3 4.6  CL 105 109  CO2 20* 20*  GLUCOSE 96 98  BUN 20 21*  CREATININE 0.71 0.79  CALCIUM 10.1 10.1   PT/INR No results for input(s): LABPROT, INR in the last 72 hours. CMP     Component Value Date/Time   NA 140 08/26/2017 0302   K 4.6 08/26/2017 0302   CL 109 08/26/2017 0302   CO2 20 (L) 08/26/2017 0302   GLUCOSE 98 08/26/2017 0302   BUN 21 (H) 08/26/2017 0302   CREATININE 0.79 08/26/2017 0302   CREATININE 0.85 07/22/2013 1021   CALCIUM 10.1 08/26/2017 0302   PROT 8.1 08/24/2017 0619   ALBUMIN 3.3 (L) 08/24/2017 0619   AST 75 (H) 08/24/2017 0619   ALT  156 (H) 08/24/2017 0619   ALKPHOS 225 (H) 08/24/2017 0619   BILITOT 0.5 08/24/2017 0619   GFRNONAA >60 08/26/2017 0302   GFRAA >60 08/26/2017 0302   Lipase     Component Value Date/Time   LIPASE 27 09/15/2015 0934       Studies/Results: Ct Abdomen Pelvis W Contrast  Result Date: 08/25/2017 CLINICAL DATA:  Nausea and vomiting. Multiple recent abdominal surgeries most recently 07/31/2017. EXAM: CT ABDOMEN AND PELVIS WITH CONTRAST TECHNIQUE: Multidetector CT imaging of the abdomen and pelvis was performed using the standard protocol following bolus administration of intravenous contrast. Patient did not tolerate enteric contrast. CONTRAST:  197mL ISOVUE-300 IOPAMIDOL (ISOVUE-300) INJECTION 61% COMPARISON:  Most recent CT 06/29/2017 FINDINGS: Lower chest: Trace right pleural fluid/ thickening. Minimal bibasilar atelectasis. Normal heart size. Hepatobiliary: No focal hepatic lesion. No calcified gallstone. The previous perihepatic fluid collection has diminished with persistent soft tissue thickening. The soft tissue extends from the anterior liver distally to the within defect to the right lower abdominal wall. No definite residual fluid component. Pancreas: No ductal dilatation or inflammation. Spleen: Normal in size without focal abnormality. Adrenals/Urinary Tract: Post right adrenalectomy. Left adrenal gland is normal. No hydronephrosis. Minimal fluid in the right pararenal space appears similar to prior exam. Punctate nonobstructing stone in the left kidney. There is symmetric excretion  on delayed phase imaging. Urinary bladder is physiologically distended without wall thickening. Stomach/Bowel: Stomach physiologically distended with fluid. Mild duodenal distention with fluid. Enteric sutures within bowel loops in the central abdomen. Proximal small bowel is fluid-filled and prominent in caliber. Probable transition in the anterior central abdomen, with decompressed small bowel in the central and  left abdomen. No pneumatosis. More distal small bowel is fluid-filled and prominent with ileal colonic anastomosis in the transverse colon. Transverse, descending, and sigmoid colon are fluid-filled. Despite dilated and fluid-filled bowel, there is no definite bowel wall thickening. Vascular/Lymphatic: Aortic atherosclerosis. No abdominal or pelvic adenopathy. Reproductive: Prostatic calcifications. Other: Previous surgical drains have been removed. No new intra-abdominal fluid collection. Right lateral abdominal wall defect with packing material. Presumed surgical mesh in the upper abdomen. No free intra-abdominal air. There is mild central mesenteric engorgement. Musculoskeletal: There are no acute or suspicious osseous abnormalities. IMPRESSION: 1. Probable adhesions in the central anterior abdomen with fluid-filled proximal bowel, and a tentative transition point anteriorly. There is intervening decompressed small bowel with distal small bowel and colon fluid-filled and prominent. Overall findings are likely related to slow GI transit, with intermittent obstruction considered secondary to adhesions. Small bowel follow-through could be considered to evaluate for small bowel transit. 2. Decreased size of perihepatic fluid collection with residual soft tissue thickening, no residual drainable fluid component. No new intra-abdominal abscess. No free air. 3. No other new abnormality.  Aortic Atherosclerosis (ICD10-I70.0). Electronically Signed   By: Jeb Levering M.D.   On: 08/25/2017 21:22   Dg Chest Port 1 View  Result Date: 08/25/2017 CLINICAL DATA:  Elevated white blood cell count EXAM: PORTABLE CHEST 1 VIEW COMPARISON:  August 20, 2017 FINDINGS: No edema or consolidation. Heart size and pulmonary vascularity are normal. No adenopathy. There is aortic atherosclerosis. There is postoperative change in the lower cervical spine. Central catheter has been removed. No pneumothorax. IMPRESSION: No edema or  consolidation.  There is aortic atherosclerosis. Aortic Atherosclerosis (ICD10-I70.0). Electronically Signed   By: Lowella Grip III M.D.   On: 08/25/2017 09:44    Anti-infectives: Anti-infectives    Start     Dose/Rate Route Frequency Ordered Stop   08/25/17 1200  anidulafungin (ERAXIS) 200 mg in sodium chloride 0.9 % 200 mL IVPB     200 mg 78 mL/hr over 200 Minutes Intravenous Every 24 hours 08/25/17 1047     08/11/17 1400  fluconazole (DIFLUCAN) IVPB 100 mg  Status:  Discontinued     100 mg 50 mL/hr over 60 Minutes Intravenous Every 24 hours 08/11/17 1312 08/16/17 1847   08/01/17 0600  metroNIDAZOLE (FLAGYL) IVPB 500 mg  Status:  Discontinued     500 mg 100 mL/hr over 60 Minutes Intravenous On call to O.R. 07/31/17 1656 07/31/17 1709   08/01/17 0600  ciprofloxacin (CIPRO) IVPB 400 mg  Status:  Discontinued     400 mg 200 mL/hr over 60 Minutes Intravenous On call to O.R. 07/31/17 1656 07/31/17 1709   07/31/17 0556  metroNIDAZOLE (FLAGYL) IVPB 500 mg     500 mg 100 mL/hr over 60 Minutes Intravenous On call to O.R. 07/31/17 0556 07/31/17 0840   07/31/17 0556  ciprofloxacin (CIPRO) IVPB 400 mg     400 mg 200 mL/hr over 60 Minutes Intravenous On call to O.R. 07/31/17 0556 07/31/17 0914   07/08/17 1600  fluconazole (DIFLUCAN) IVPB 100 mg     100 mg 50 mL/hr over 60 Minutes Intravenous Every 24 hours 07/08/17 0828 07/14/17 1710  07/01/17 1500  fluconazole (DIFLUCAN) IVPB 100 mg     100 mg 50 mL/hr over 60 Minutes Intravenous Every 24 hours 07/01/17 1401 07/07/17 1715   06/16/17 1200  piperacillin-tazobactam (ZOSYN) IVPB 3.375 g     3.375 g 12.5 mL/hr over 240 Minutes Intravenous Every 8 hours 06/16/17 1026 06/17/17 0004   06/08/17 1200  piperacillin-tazobactam (ZOSYN) IVPB 3.375 g  Status:  Discontinued     3.375 g 12.5 mL/hr over 240 Minutes Intravenous Every 8 hours 06/08/17 1056 06/16/17 1026   05/29/17 2200  ceFEPIme (MAXIPIME) 2 g in dextrose 5 % 50 mL IVPB  Status:   Discontinued     2 g 100 mL/hr over 30 Minutes Intravenous Every 8 hours 05/29/17 2031 06/03/17 0832   05/29/17 2100  metroNIDAZOLE (FLAGYL) IVPB 500 mg  Status:  Discontinued     500 mg 100 mL/hr over 60 Minutes Intravenous Every 8 hours 05/29/17 2029 06/03/17 0832   05/26/17 2200  ceFAZolin (ANCEF) IVPB 1 g/50 mL premix  Status:  Discontinued     1 g 100 mL/hr over 30 Minutes Intravenous Every 8 hours 05/26/17 1008 05/29/17 2023   05/25/17 1100  vancomycin (VANCOCIN) 1,250 mg in sodium chloride 0.9 % 250 mL IVPB  Status:  Discontinued     1,250 mg 166.7 mL/hr over 90 Minutes Intravenous Every 12 hours 05/25/17 0955 05/26/17 0946   05/22/17 1400  anidulafungin (ERAXIS) 100 mg in sodium chloride 0.9 % 100 mL IVPB     100 mg 78 mL/hr over 100 Minutes Intravenous Every 24 hours 05/21/17 1330 05/27/17 1740   05/21/17 1400  anidulafungin (ERAXIS) 200 mg in sodium chloride 0.9 % 200 mL IVPB     200 mg 78 mL/hr over 200 Minutes Intravenous  Once 05/21/17 1330 05/21/17 1940   05/15/17 1000  anidulafungin (ERAXIS) 100 mg in sodium chloride 0.9 % 100 mL IVPB  Status:  Discontinued     100 mg 78 mL/hr over 100 Minutes Intravenous Every 24 hours 05/14/17 0829 05/16/17 0901   05/15/17 1000  metroNIDAZOLE (FLAGYL) IVPB 500 mg  Status:  Discontinued     500 mg 100 mL/hr over 60 Minutes Intravenous Every 8 hours 05/15/17 0903 05/25/17 0938   05/14/17 0900  anidulafungin (ERAXIS) 200 mg in sodium chloride 0.9 % 200 mL IVPB     200 mg 78 mL/hr over 200 Minutes Intravenous  Once 05/14/17 0829 05/14/17 1325   05/13/17 1927  vancomycin (VANCOCIN) 1-5 GM/200ML-% IVPB    Comments:  Ward, Christa   : cabinet override      05/13/17 1927 05/14/17 0744   05/12/17 1400  ceFEPIme (MAXIPIME) 1 g in dextrose 5 % 50 mL IVPB     1 g 100 mL/hr over 30 Minutes Intravenous Every 8 hours 05/12/17 0939 05/26/17 2359   05/12/17 0900  vancomycin (VANCOCIN) IVPB 1000 mg/200 mL premix  Status:  Discontinued     1,000  mg 200 mL/hr over 60 Minutes Intravenous Every 12 hours 05/12/17 0750 05/16/17 0852   05/12/17 0830  aztreonam (AZACTAM) 2 GM IVPB     2 g 100 mL/hr over 30 Minutes Intravenous  Once 05/12/17 0800 05/12/17 0957   05/06/17 0916  vancomycin (VANCOCIN) IVPB 1000 mg/200 mL premix     1,000 mg 200 mL/hr over 60 Minutes Intravenous On call to O.R. 05/06/17 0916 05/06/17 1229       Assessment/Plan Acute respiratory failure/ post op HCAP pneumonia/post op pleural effusion - CCM/extubated 05/28/17 Malnutrition -  PICC/TNA 05/14/17; albumin 2.3 and preablumin 13.3 on 10/22 Oral thrush  Hx of hypertension Hx of anemia Hx of anxiety Hx of hypothyroid Chronic pain medicine use - neck pain - Pt being followed by Palliative to assist with pain control. 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 S/p laparoscopic converted to open right adrenalectomy, small bowel resection with anastomosis, placement of 20cm^2 wound vac, 05/06/17, Dr. Lurena Joiner [name] 2. Post op wound dehiscence - primary repair of small bowel anastomotic leak, abdominal closure of wound dehisence, 05/13/17, Luke [name] 3. S/p reopening of recent laparotomy, lysis of adhesions, small bowel resection, placement 200cm^2 negative pressure dressing, 05/14/17, Luke [name] 4. S/p reopening of recent laparotomy, creation of tube ileostomy, placement of 100cm2 negative pressure dressing, 05/17/17, Luke [name] 5. S/p abdominal wash out, placement of mesh for abdominal closure, placement of wound vac of 100cm^2, 05/20/17, Luke [name] 6. S/p Bronchoscopy 05/22/17 Dr. Simonne Maffucci EC fistula 06/13/17 - noted on exam  7. Laparoscopy, laparotomy, extensive enterolysis with removal of mesh (partially) in the right upper quadrant; resection of old anastomosis and ileotransverse anastomosis, 07/31/17,Dr.Matthew Hassell Done  - continue dressing changes - continue therapies - encourage PO intake, off TPN,  PICC removed - continuereglan 10mg  q8h for nausea and gut motility - remeron qhs to stimulate appetite  - restarted on Eraxis for oral thrush - palliative care assisting in pain management - wife wanting to know if he could have some liquid pain medication (hycet or codeine?) to help wean off the IV pain medication  FEN: Regular diet 10/19; TPN stopped 10/29 ID: Vancomycin 7/11- 05/25/17, Maixpime 7/17-7/31/18, Eraxis 7/19-05/27/17, Diflucan 9/5-9/14,&10/16-10/21/18; Cipro/ Flagyl pre-op 07/31/17; Eraxis 10/30>> DVT: Lovenox, SCDs Foley: Out Follow up: Hassell Done  Plan: Pain management per palliative care. Encourage PO intake. Nausea improved slightly. CXR and CT abd/pelvis negative for infectious process. Check UA and culture. Elevated WBC could be from oral thrush?   LOS: 112 days    Brigid Re , [facility] 08/26/2017, 7:39 AM Pager: [phone] Consults: [phone] Mon-Fri 7:00 am-4:30 pm Sat-Sun 7:00 am-11:30 am

## 2017-08-26 NOTE — Discharge Instructions (Signed)
MIDLINE WOUND CARE: - midline dressing to be changed once daily - supplies: sterile saline, kerlix, scissors, ABD pads, tape  - remove dressing and all packing carefully, moistening with sterile saline as needed to avoid packing/internal dressing sticking to the wound. - clean edges of skin around the wound with water/gauze, making sure there is no tape debris or leakage left on skin that could cause skin irritation or breakdown. - dampen and clean kerlix with sterile saline and pack wound from wound base to skin level, making sure to take note of any possible areas of wound tracking, tunneling and packing appropriately. Wound can be packed loosely. Trim kerlix to size if a whole kerlix is not required. - cover wound with a dry ABD pad and secure with tape.  - write the date/time on the dry dressing/tape to better track when the last dressing change occurred. - apply any skin protectant/powder recommended by clinician to protect skin/skin folds. - change dressing as needed if leakage occurs, wound gets contaminated, or patient requests to shower. - patient may shower daily with wound open and following the shower the wound should be dried and a clean dressing placed.   1. PAIN CONTROL:  1. Pain is best controlled by a usual combination of three different methods TOGETHER:  1. Ice/Heat 2. Over the counter pain medication 3. Prescription pain medication 2. Most patients will experience some swelling and bruising around wounds. Ice packs or heating pads (30-60 minutes up to 6 times a day) will help. Use ice for the first few days to help decrease swelling and bruising, then switch to heat to help relax tight/sore spots and speed recovery. Some people prefer to use ice alone, heat alone, alternating between ice & heat. Experiment to what works for you. Swelling and bruising can take several weeks to resolve.  3. It is helpful to take an over-the-counter pain medication regularly for the first few  weeks. Choose one of the following that works best for you:  1. Naproxen (Aleve, etc) Two 220mg  tabs twice a day 2. Ibuprofen (Advil, etc) Three 200mg  tabs four times a day (every meal & bedtime) 3. Acetaminophen (Tylenol, etc) 500-650mg  four times a day (every meal & bedtime) 4. A prescription for pain medication (such as oxycodone, hydrocodone, etc) should be given to you upon discharge. Take your pain medication as prescribed.  1. If you are having problems/concerns with the prescription medicine (does not control pain, nausea, vomiting, rash, itching, etc), please call us [phone] to see if we need to switch you to a different pain medicine that will work better for you and/or control your side effect better. 2. If you need a refill on your pain medication, please contact your pharmacy. They will contact our office to request authorization. Prescriptions will not be filled after 5 pm or on week-ends. 4. Avoid getting constipated. When taking pain medications, it is common to experience some constipation. Increasing fluid intake and taking a fiber supplement (such as Metamucil, Citrucel, FiberCon, MiraLax, etc) 1-2 times a day regularly will usually help prevent this problem from occurring. A mild laxative (prune juice, Milk of Magnesia, MiraLax, etc) should be taken according to package directions if there are no bowel movements after 48 hours.  5. Watch out for diarrhea. If you have many loose bowel movements, simplify your diet to bland foods & liquids for a few days. Stop any stool softeners and decrease your fiber supplement. Switching to mild anti-diarrheal medications (Kayopectate, Pepto Bismol) can help.  If this worsens or does not improve, please call us. 6. Wash / shower every day. You may shower daily and replace your bandges after showering. No bathing or submerging your wounds in water until they heal. 7. FOLLOW UP in our office  1. Please call [facility] at [phone] to set up an  appointment for a follow-up appointment approximately 2-3 weeks after discharge for wound check  WHEN TO CALL us [phone]:  1. Poor pain control 2. Reactions / problems with new medications (rash/itching, nausea, etc)  3. Fever over 101.5 F (38.5 C) 4. Worsening swelling or bruising 5. Continued bleeding from wounds. 6. Increased pain, redness, or drainage from the wounds which could be signs of infection  The clinic staff is available to answer your questions during regular business hours (8:30am-5pm). Please dont hesitate to call and ask to speak to one of our nurses for clinical concerns.  If you have a medical emergency, go to the nearest emergency room or call 911.  A surgeon from [facility] is always on call at the [facility], [address], [place], [place], [place] [address] ?  MAIN: [phone] ? TOLL FREE: [phone] ?  FAX [phone]  [email or web address]

## 2017-08-26 NOTE — Discharge Summary (Signed)
[facility] Discharge Summary   Patient ID: [name] [name] MRN: [id number] DOB/AGE: 57-Sep-1962 57 y.o.  Admit date: 05/06/2017 Discharge date: 09/04/2017 Admitting Diagnosis: Pheochromocytoma  Discharge Diagnosis Patient Active Problem List   Diagnosis Date Noted  . PVC (premature ventricular contraction)   . Anxiety state 08/14/2017  . Hypomagnesemia 06/28/2017  . Hypothyroidism   . 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 Protein-calorie malnutrition, severe ([place]) 06/27/2017  . 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 Pressure injury of skin 05/29/2017  . Ileus ([place])   . Hypokalemia 05/14/2017  . Anastomotic leak of intestine 05/14/2017  . Wound dehiscence, surgical 05/14/2017  . Aspiration pneumonia ([place]) 05/14/2017  . Chronic narcotic use 05/10/2017  . Generalized anxiety disorder 05/10/2017  . Intra-abdominal adhesions s/p SB resection 05/06/2017 05/09/2017  . Throat symptom 03/18/2016  . Multinodular goiter 01/19/2016  . Hyperthyroidism 01/19/2016  . Essential hypertension   . Diarrhea 11/30/2014  . Anemia, iron deficiency 11/01/2014  . Leukocytosis 11/03/2013  . Tobacco abuse 07/26/2013  . COPD (chronic obstructive pulmonary disease) ([place]) 07/26/2013  . Left knee pain 07/26/2013  . Pain in joint, shoulder region 05/03/2013  . GERD 07/24/2008  . 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    Consultants [facility] Palliative Care Orthopedic Surgery Cardiology  Imaging: No results found.  Procedures 1. Laparoscopic converted to open right adrenalectomy, small bowel resection, VAC placement - 05/06/17 Dr. Lurena Joiner [name] 2. Primary repair of small bowel anastomotic leak, abdominal closure of wound dehisence - 05/13/17 Dr. Lurena Joiner [name] 3. Reopening of recent laparotomy, LOA, small bowel resection, VAC placement - 05/14/17 Dr. Lurena Joiner [name] 4. Reopening of  recent laparotomy, creation of tube ileostomy, VAC placement - 05/17/17 Dr. Lurena Joiner [name] 5. Placement of mesh for abdominal closure, VAC placement - 05/20/17 Dr. Lurena Joiner [name] 6. Bronchoscopy - 05/22/17 Dr. Simonne Maffucci 7. US aspiration of small perihepatic fluid collection - 05/30/17 Dr. Jacqulynn Cadet 8. Laparoscopy, laparotomy, extensive enterolysis with partial removal of mesh in the RUQ, resection of old anastomosis and ileotransverse anastomosis - 07/31/17 Dr. Lynn Ito Course:  [name] [name] is a 56 y.o. Male who presented to [facility] for laparoscopic right adrenalectomy for a right adrenal mass that had increased in size over the last few years and was concerning for pheochromocytoma. WOC was consulted for assistance with VAC dressings. Patient had significant pain in the right side post-operatively, but had a history of chronic pain issues. Patient developed some post-operative ileus, kept on clears. Patient WBC count trending up 7/17, repeat CT done 7/18 and showed dehiscence with leak. Patient taken back to the OR, taken to the ICU post-operatively. Critical care consulted for assistance with ventilator management. 7/19 patient found to have succus in wound and high fever and was taken back to OR due to concern for re-leak, abdomen left open post-operatively. PICC line placed and patient started on TPN 7/20. Patient taken back to the OR 7/22 for above listed procedure, abdomen not able to be completely closed and small bowel unable to be re-anastamosed. Enterotomies were created and drains placed. Supportive care continued and patient returned to OR 7/25 for abdominal closure with temporary mesh. Bronchoscopy done 7/27 for HCAP. Patient was able to be extubated 8/2 and tolerated well. Drain noted to have some concerning output 8/3, CT showed fluid anterior to liver. Radiology consulted for fluid aspiration which was done 8/4, no growth from cultures. Patient noted to have feculent  appearing  drainage from wound 8/13 but no fistulous tract identified, patient started on empiric antibiotics and VAC dressing changed to white foam over concerning part of wound. CT 8/14 concerning for enterocutaneous fistula. Patient's family requesting patient's care be transitioned to another surgeon 8/14, Dr. Hassell Done assumed care. Patient noted to have some symptomatic anemia 8/17, transfused 1 unit PRBC. Patient continued on TPN and bowel rest to try to allow fistula to heal. Patient started on some elemental liquids 8/22. Patient started on nystatin for oral thrush 8/22. Patient fell 8/25 while getting up to the bathroom, CT head was negative and no other injuries were noted. Patient pulled out NGT 8/31-9/1. CT repeated 9/3 was stable. Palliative care consulted 9/4 to assist with pain and symptom management. Plans made tentatively for repeat exploratory laparotomy with hopeful reconstitution of small bowel for late September vs early October with Dr. Hassell Done. Patient was continued on supportive measures and efforts to optimize nutritional and functional status until being taken back to the OR 10/5.   The patient was taken to the OR on October 5th and Dr. Hassell Done and Dr. Dema Severin performed an extensive laparotomy with enterolysis (6 hours), resection of the prior ileocolic anastomsis and the two ends of the EC fistula and mucous fistula.  The bowel was reconstituted and the abdomen closed with vicryl mesh; primary closure of the large right subcostal incision and closure of the midline with double layer vicryl mesh and retention sutures.    Over the ensuing month, [name] [name] was rehabilitated; first we were slow to get him up to prevent incisional complications.  Then we slowly advanced his diet as his postop ileus resolved.  Pain management was a problem from his pre admission dependence on narcotics for cervical pain.    His wound was healing nicely and he has shown no evidence of recurrent fistula to date.   We have tried to help him manage his anxiety.  He will take Percocet home for pain.   He was ready for discharge on Friday, Nov 9th.  His wife has assumed his wound care at home and arrangements made for me to follow in the office post discharge.  --Kaylyn Lim 09/04/2017    Allergies as of 09/04/2017      Reactions   Chantix [varenicline] Palpitations   Heart race Increased panic attacks   Morphine Itching   Penicillins Rash   Has patient had a PCN reaction causing immediate rash, facial/tongue/throat swelling, SOB or lightheadedness with hypotension: Yes Has patient had a PCN reaction causing severe rash involving mucus membranes or skin necrosis: Unknown Has patient had a PCN reaction that required hospitalization: No Has patient had a PCN reaction occurring within the last 10 years: No If all of the above answers are "NO", then may proceed with Cephalosporin use. Tolerating Zosyn 05/2017      Medication List    STOP taking these medications   acetaminophen 500 MG tablet Commonly known as:  TYLENOL   omeprazole 40 MG capsule Commonly known as:  PRILOSEC     TAKE these medications   atorvastatin 10 MG tablet Commonly known as:  LIPITOR Take 10 mg by mouth daily.   chlorthalidone 25 MG tablet Commonly known as:  HYGROTON Take 12.5 mg by mouth daily.   cloNIDine 0.1 MG tablet Commonly known as:  CATAPRES Take 0.1 mg by mouth 2 (two) times daily.   diclofenac sodium 1 % Gel Commonly known as:  VOLTAREN Apply 4 g 4 (four) times daily topically.  famotidine 40 MG tablet Commonly known as:  PEPCID Take 1 tablet (40 mg total) at bedtime by mouth.   gabapentin 300 MG capsule Commonly known as:  NEURONTIN Take 1 capsule (300 mg total) at bedtime by mouth. What changed:    medication strength  how much to take  when to take this  reasons to take this   ibuprofen 200 MG tablet Commonly known as:  ADVIL,MOTRIN Take 600 mg by mouth daily as needed for headache.  Reported on 03/18/2016   labetalol 100 MG tablet Commonly known as:  NORMODYNE Take 0.5 tablets (50 mg total) by mouth 2 (two) times daily.   LORazepam 1 MG tablet Commonly known as:  ATIVAN Take 1 mg by mouth 2 (two) times daily as needed for anxiety.   methocarbamol 500 MG tablet Commonly known as:  ROBAXIN Take 1 tablet (500 mg total) by mouth 2 (two) times daily. What changed:    when to take this  reasons to take this   metoCLOPramide 5 MG tablet Commonly known as:  REGLAN Take 1 tablet (5 mg total) 3 (three) times daily before meals by mouth.   mirtazapine 15 MG disintegrating tablet Commonly known as:  REMERON SOL-TAB Take 1 tablet (15 mg total) at bedtime by mouth.   multivitamin with minerals Tabs tablet Take 1 tablet by mouth daily.   ondansetron 4 MG disintegrating tablet Commonly known as:  ZOFRAN ODT Take 1 tablet (4 mg total) every 8 (eight) hours as needed by mouth for nausea or vomiting.   oxyCODONE-acetaminophen 10-325 MG tablet Commonly known as:  PERCOCET Take 1 tablet every 6 (six) hours as needed by mouth for pain. What changed:  how much to take   Potassium Chloride ER 20 MEQ Tbcr Take 20 mEq by mouth daily.   promethazine 12.5 MG tablet Commonly known as:  PHENERGAN Take 1 tablet (12.5 mg total) 3 (three) times daily before meals by mouth.   sildenafil 100 MG tablet Commonly known as:  VIAGRA Take 100 mg by mouth daily as needed for erectile dysfunction.   tadalafil 20 MG tablet Commonly known as:  CIALIS Take 20 mg by mouth daily as needed for erectile dysfunction.   valsartan 80 MG tablet Commonly known as:  DIOVAN Take 80 mg by mouth daily.            Durable Medical Equipment  (From admission, onward)        Start     Ordered   09/02/17 1542  For home use only DME Walker rolling  Once    Question:  Patient needs a walker to treat with the following condition  Answer:  Weakness generalized   09/02/17 1542        Follow-up Information    Johnathan Hausen, MD. Call.   Specialty:  General Surgery Why:  Call and schedule a follow up appointment  Contact information: [address] [place] [address] [phone]           Signed: Brigid Re, [facility] 08/26/2017, 1:30 PM Pager: [phone] Consults: [phone] Mon-Fri 7:00 am-4:30 pm Sat-Sun 7:00 am-11:30 am

## 2017-08-26 NOTE — Progress Notes (Signed)
Nutrition Follow-up  DOCUMENTATION CODES:   Not applicable  INTERVENTION:  - Continue Ensure Enlive BID, Boost Breeze BID, and Magic Cup once/day. - Will order Unjury Chicken Soup once/day, each 8 ounce serving provides 100kcal and 21 grams of protein.  - Continue to encourage PO intakes of meals and supplements.   NUTRITION DIAGNOSIS:   Inadequate oral intake related to poor appetite as evidenced by per patient/family report, meal completion < 25%. -ongoing at this time.   GOAL:   Patient will meet greater than or equal to 90% of their needs -no longer meeting with decreased PO intakes and d/c of TPN.   MONITOR:   PO intake, Supplement acceptance, Weight trends, Labs, Skin, I & O's  ASSESSMENT:   56 y.o. 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 On 7/18, he developed a leak therefore was taken back to OR for re-do of ex-lap and repair of leak.  /11 ZO:XWRUE adrenalectomy with complication of intestinal injury and small bowel resection with anastomosis  7/18 AV:[id number] of small bowel anastomotic leak, abdominal closure of wound dehisence 7/19 OR: reopening of recent laparotomy, lysis of adhesions, noted ischemic perforation of new loop of intestine, intact repair of previous anastomotic leak repair, intact previous anastomosis.  7/22 OR: wash out, tube ileostomy, left with open abdomen &wound vac in place. Plan for OR on 7/25 for closure 7/25 OR: mesh placed for abdominal closure, wound vac 8/2extubated, propofol drip off 8/4TPN off for ~ 5 hours due to line detached from filter. Ultrasound-guided aspiration By interventional radiology of small perihepatic fluid collection yielded only 1 mL of the fluid, sent for culture. NPO - mild coffee ground on NG but no active bleed 8/6patient  improving. Off Precedex drip since 8/4. Dilaudid Drip being changed over to PCA. Still with some coffee ground-looking material in NG tube.  8/10 hyponatremia and plan to decrease TPN from 3L/day to 2L/day x1 week 8/47found to have fecal matter in wound vac (EC fistula) 8/14increase in fecal matter presence in wound vac with no plan for surgery at this time 8/16TPN advanced to goal rate of 115 ml/hr with 20% ILE @ 20 mL/hr x12 hours on MWF 8/22: NGT clamped. RD to order Boost Breeze for patient to sip on; Mycostatin started for oral thrush. 9/1:pulled NGT and no plans for replacement. 9/1:developed N/V and only permitted sips of clears, if tolerated. 9/4:NGT re-insertion attempted several times without success 9/7:begin cycling TPN 9/23: allowed to leave the floor to go outside 10/5 BJ:[id number], laparotomy, extensive enterolysis with removal of mesh (partially) in the right upper quadrant; resection of old anastomosis and ileotransverse anastomosis 10/5:NGT placement during surgery 10/15:development of recurrent oral thrush 10/17:NGT removed at 10:45 AM 86/57:QIONGEXB cyclic TPN volume from 3L/day to 2L/day 10/22-10/24:Calorie Count 10/29: TPN stopped and PICC removed   10/31 Pt now off of TPN but has been eating poorly d/t persistent nausea. Pt states that he did not consume breakfast d/t nausea this AM. He denies emesis this AM but states he threw up clear emesis yesterday evening. Pt has scheduled Phenergan, scheduled Reglan, and PRN Zofran ordered. He reports receiving antinausea medication yesterday after emesis but that this did not help and that he was given antinausea medication this AM and has had no change/no improvement in nausea. He confirms that he is having anxiety and states that it  is related to being in the hospital and not wanting to be here, but he denies anxiety causing or worsening nausea; will continue to monitor symptoms and any related changes in  medications. Notes state pt with ongoing oral thrush; unable to determine if this is a factor limiting PO intake but will ask pt about this further at follow-up.   Pt currently has Ensure Enlive and Boost Breeze each ordered BID and has accepted 4/10 of each of these since 10/26. RN yesterday communicated to RD that MD was interested in increasing protein provision. Will trial Unjury Chicken Soup as this is a savory, rather than sweet, oral supplement. Per chart review, weight -3 lbs/1.6 kg since 10/26; will continue to monitor weight trends. RN reports that pt to transfer to [address] [place] today/when bed available.   Medications reviewed; 40 mg oral Pepcid/day, 10 mg IV Reglan TID, 4 mg IV Zofran PRN, 12.5 mg IV Phenergan TID + PRN, 1 packet Metamucil/day, daily multivitamin with minerals, 1 mg oral Ativan TID, 200 mg IV Eraxis/day.  Labs reviewed.    10/26 - Pt sleeping at this time with no family/visitors present.  - Breakfast tray still on bedside table with 100% completion (grits, [place] sausage, milk: 453 kcal and 20 grams of protein).  - RN note from shortly after midnight statues that PICC had come out part way and that it was assessed/fixed by IV team.  - Pt with triple lumen PICC and TPN regimen that is now infusing is outlined below.  - Per chart review, weight -6 lbs/2.8 kg from yesterday.  Lab: Phos: 6.3 mg/dL.   10/25 - Spoke with Pharmacist this AM and she states that Surgery team has requested further wean of TPN (to 1 L/day). - This + current ILE regimen will provide a daily average of 50 grams of protein (36% minimum estimated protein need) and 916 kcal (45% minimum estimated kcal need).  - Pt is currently ordered Ensure Enlive once/day and Boost Breeze once/day.  - Plan to increase both of these to BID and order Prostat BID. - Pt enjoys Ensure Enlive mixed with ice cream; mix Prostat with drink of choice.  - Will also order Magic Cup for all dinner trays.       Diet Order:   Diet regular Room service appropriate? Yes; Fluid consistency: Thin  EDUCATION NEEDS:   No education needs identified at this time  Skin:  Skin Assessment: Wound (see comment) (Incisions to R flank and abdomen from 7/11 and 7/18)  Last BM:  10/31  Height:   Ht Readings from Last 1 Encounters:  05/26/17 5\' 10"  (1.778 m)    Weight:   Wt Readings from Last 1 Encounters:  08/26/17 165 lb 2 oz (74.9 kg)    Ideal Body Weight:  75.45 kg  BMI:  Body mass index is 23.69 kg/m.  Estimated Nutritional Needs:   Kcal:  2030-2270 (25-28 kcal/kg)  Protein:  137-153 grams (1.7-1.9 grams/kg)  Fluid:  2 L/day     Jarome Matin, MS, RD, LDN, [facility] Inpatient Clinical Dietitian Pager # [phone] After hours/weekend pager # [phone]

## 2017-08-27 LAB — URINALYSIS, ROUTINE W REFLEX MICROSCOPIC
Bilirubin Urine: NEGATIVE
GLUCOSE, UA: NEGATIVE mg/dL
Hgb urine dipstick: NEGATIVE
Ketones, ur: 5 mg/dL — AB
Leukocytes, UA: NEGATIVE
NITRITE: NEGATIVE
PROTEIN: 30 mg/dL — AB
Specific Gravity, Urine: 1.031 — ABNORMAL HIGH (ref 1.005–1.030)
Squamous Epithelial / LPF: NONE SEEN
pH: 5 (ref 5.0–8.0)

## 2017-08-27 LAB — BASIC METABOLIC PANEL
ANION GAP: 12 (ref 5–15)
BUN: 22 mg/dL — AB (ref 6–20)
CALCIUM: 10.1 mg/dL (ref 8.9–10.3)
CO2: 20 mmol/L — ABNORMAL LOW (ref 22–32)
CREATININE: 0.82 mg/dL (ref 0.61–1.24)
Chloride: 108 mmol/L (ref 101–111)
GLUCOSE: 96 mg/dL (ref 65–99)
POTASSIUM: 4.2 mmol/L (ref 3.5–5.1)
Sodium: 140 mmol/L (ref 135–145)

## 2017-08-27 LAB — CBC
HCT: 37.8 % — ABNORMAL LOW (ref 39.0–52.0)
Hemoglobin: 11.8 g/dL — ABNORMAL LOW (ref 13.0–17.0)
MCH: 24.3 pg — AB (ref 26.0–34.0)
MCHC: 31.2 g/dL (ref 30.0–36.0)
MCV: 77.8 fL — ABNORMAL LOW (ref 78.0–100.0)
PLATELETS: 576 10*3/uL — AB (ref 150–400)
RBC: 4.86 MIL/uL (ref 4.22–5.81)
RDW: 19.9 % — AB (ref 11.5–15.5)
WBC: 11.3 10*3/uL — ABNORMAL HIGH (ref 4.0–10.5)

## 2017-08-27 NOTE — Progress Notes (Signed)
Spoke with palliative care MD Dr. Hilma Favors. Will start transition to PO pain medication early next week. Burtis Junes this over changing too much now. Would like to allow patient some time for thrush to resolve and hopefully nausea will improve. Patient in better spirits today and working hard to transition towards home care.  Brigid Re , [facility] 08/27/2017, 3:22 PM Pager: [phone] Mon-Fri 7:00 am-4:30 pm Sat-Sun 7:00 am-11:30 am

## 2017-08-27 NOTE — Progress Notes (Signed)
[facility] Progress Note  27 Days Post-Op  Subjective: CC: nausea Patient states nausea is slightly improved, but persistent. Abdominal pain is pretty well controlled currently, but patient has chronic pain. Trying to take in more liquids and transition to more PO medications. Working with therapies. Hopefull to transition towards home care soon. Left knee pain is better.  UOP good. VSS.   Objective: Vital signs in last 24 hours: Temp:  [97.9 F (36.6 C)-98.5 F (36.9 C)] 97.9 F (36.6 C) (11/01 0514) Pulse Rate:  [81-100] 86 (11/01 0514) Resp:  [14-97] 97 (11/01 0737) BP: (117-130)/(80-96) 118/87 (11/01 0514) SpO2:  [96 %-100 %] 98 % (11/01 0514) Weight:  [75.1 kg (165 lb 9.1 oz)] 75.1 kg (165 lb 9.1 oz) (11/01 0615) Last BM Date: 08/26/17  Intake/Output from previous day: 10/31 0701 - 11/01 0700 In: 910 [P.O.:600; I.V.:50; IV Piggyback:260] Out: 300 [Urine:300] Intake/Output this shift: No intake/output data recorded.  PE: Gen: Alert, NAD, pleasant Card: Regular rate and rhythm Pulm: Normal effort Abd: Soft, not distended, +BS; wounds as pictured below    Skin: warm and dry, no rashes  Psych: A&Ox3   Lab Results:   Recent Labs  08/25/17 0314 08/27/17 0506  WBC 13.1* 11.3*  HGB 11.9* 11.8*  HCT 36.6* 37.8*  PLT 630* 576*   BMET  Recent Labs  08/26/17 0302 08/27/17 0506  NA 140 140  K 4.6 4.2  CL 109 108  CO2 20* 20*  GLUCOSE 98 96  BUN 21* 22*  CREATININE 0.79 0.82  CALCIUM 10.1 10.1   PT/INR No results for input(s): LABPROT, INR in the last 72 hours. CMP     Component Value Date/Time   NA 140 08/27/2017 0506   K 4.2 08/27/2017 0506   CL 108 08/27/2017 0506   CO2 20 (L) 08/27/2017 0506   GLUCOSE 96 08/27/2017 0506   BUN 22 (H) 08/27/2017 0506   CREATININE 0.82 08/27/2017 0506   CREATININE 0.85 07/22/2013 1021   CALCIUM 10.1 08/27/2017 0506   PROT 8.1 08/24/2017 0619   ALBUMIN 3.3 (L) 08/24/2017 0619   AST 75 (H)  08/24/2017 0619   ALT 156 (H) 08/24/2017 0619   ALKPHOS 225 (H) 08/24/2017 0619   BILITOT 0.5 08/24/2017 0619   GFRNONAA >60 08/27/2017 0506   GFRAA >60 08/27/2017 0506   Lipase     Component Value Date/Time   LIPASE 27 09/15/2015 0934       Studies/Results: Ct Abdomen Pelvis W Contrast  Result Date: 08/25/2017 CLINICAL DATA:  Nausea and vomiting. Multiple recent abdominal surgeries most recently 07/31/2017. EXAM: CT ABDOMEN AND PELVIS WITH CONTRAST TECHNIQUE: Multidetector CT imaging of the abdomen and pelvis was performed using the standard protocol following bolus administration of intravenous contrast. Patient did not tolerate enteric contrast. CONTRAST:  129mL ISOVUE-300 IOPAMIDOL (ISOVUE-300) INJECTION 61% COMPARISON:  Most recent CT 06/29/2017 FINDINGS: Lower chest: Trace right pleural fluid/ thickening. Minimal bibasilar atelectasis. Normal heart size. Hepatobiliary: No focal hepatic lesion. No calcified gallstone. The previous perihepatic fluid collection has diminished with persistent soft tissue thickening. The soft tissue extends from the anterior liver distally to the within defect to the right lower abdominal wall. No definite residual fluid component. Pancreas: No ductal dilatation or inflammation. Spleen: Normal in size without focal abnormality. Adrenals/Urinary Tract: Post right adrenalectomy. Left adrenal gland is normal. No hydronephrosis. Minimal fluid in the right pararenal space appears similar to prior exam. Punctate nonobstructing stone in the left kidney. There is symmetric excretion on delayed  phase imaging. Urinary bladder is physiologically distended without wall thickening. Stomach/Bowel: Stomach physiologically distended with fluid. Mild duodenal distention with fluid. Enteric sutures within bowel loops in the central abdomen. Proximal small bowel is fluid-filled and prominent in caliber. Probable transition in the anterior central abdomen, with decompressed small  bowel in the central and left abdomen. No pneumatosis. More distal small bowel is fluid-filled and prominent with ileal colonic anastomosis in the transverse colon. Transverse, descending, and sigmoid colon are fluid-filled. Despite dilated and fluid-filled bowel, there is no definite bowel wall thickening. Vascular/Lymphatic: Aortic atherosclerosis. No abdominal or pelvic adenopathy. Reproductive: Prostatic calcifications. Other: Previous surgical drains have been removed. No new intra-abdominal fluid collection. Right lateral abdominal wall defect with packing material. Presumed surgical mesh in the upper abdomen. No free intra-abdominal air. There is mild central mesenteric engorgement. Musculoskeletal: There are no acute or suspicious osseous abnormalities. IMPRESSION: 1. Probable adhesions in the central anterior abdomen with fluid-filled proximal bowel, and a tentative transition point anteriorly. There is intervening decompressed small bowel with distal small bowel and colon fluid-filled and prominent. Overall findings are likely related to slow GI transit, with intermittent obstruction considered secondary to adhesions. Small bowel follow-through could be considered to evaluate for small bowel transit. 2. Decreased size of perihepatic fluid collection with residual soft tissue thickening, no residual drainable fluid component. No new intra-abdominal abscess. No free air. 3. No other new abnormality.  Aortic Atherosclerosis (ICD10-I70.0). Electronically Signed   By: Jeb Levering M.D.   On: 08/25/2017 21:22    Anti-infectives: Anti-infectives    Start     Dose/Rate Route Frequency Ordered Stop   08/25/17 1200  anidulafungin (ERAXIS) 200 mg in sodium chloride 0.9 % 200 mL IVPB     200 mg 78 mL/hr over 200 Minutes Intravenous Every 24 hours 08/25/17 1047     08/11/17 1400  fluconazole (DIFLUCAN) IVPB 100 mg  Status:  Discontinued     100 mg 50 mL/hr over 60 Minutes Intravenous Every 24 hours  08/11/17 1312 08/16/17 1847   08/01/17 0600  metroNIDAZOLE (FLAGYL) IVPB 500 mg  Status:  Discontinued     500 mg 100 mL/hr over 60 Minutes Intravenous On call to O.R. 07/31/17 1656 07/31/17 1709   08/01/17 0600  ciprofloxacin (CIPRO) IVPB 400 mg  Status:  Discontinued     400 mg 200 mL/hr over 60 Minutes Intravenous On call to O.R. 07/31/17 1656 07/31/17 1709   07/31/17 0556  metroNIDAZOLE (FLAGYL) IVPB 500 mg     500 mg 100 mL/hr over 60 Minutes Intravenous On call to O.R. 07/31/17 0556 07/31/17 0840   07/31/17 0556  ciprofloxacin (CIPRO) IVPB 400 mg     400 mg 200 mL/hr over 60 Minutes Intravenous On call to O.R. 07/31/17 0556 07/31/17 0914   07/08/17 1600  fluconazole (DIFLUCAN) IVPB 100 mg     100 mg 50 mL/hr over 60 Minutes Intravenous Every 24 hours 07/08/17 0828 07/14/17 1710   07/01/17 1500  fluconazole (DIFLUCAN) IVPB 100 mg     100 mg 50 mL/hr over 60 Minutes Intravenous Every 24 hours 07/01/17 1401 07/07/17 1715   06/16/17 1200  piperacillin-tazobactam (ZOSYN) IVPB 3.375 g     3.375 g 12.5 mL/hr over 240 Minutes Intravenous Every 8 hours 06/16/17 1026 06/17/17 0004   06/08/17 1200  piperacillin-tazobactam (ZOSYN) IVPB 3.375 g  Status:  Discontinued     3.375 g 12.5 mL/hr over 240 Minutes Intravenous Every 8 hours 06/08/17 1056 06/16/17 1026   05/29/17 2200  ceFEPIme (MAXIPIME) 2 g in dextrose 5 % 50 mL IVPB  Status:  Discontinued     2 g 100 mL/hr over 30 Minutes Intravenous Every 8 hours 05/29/17 2031 06/03/17 0832   05/29/17 2100  metroNIDAZOLE (FLAGYL) IVPB 500 mg  Status:  Discontinued     500 mg 100 mL/hr over 60 Minutes Intravenous Every 8 hours 05/29/17 2029 06/03/17 0832   05/26/17 2200  ceFAZolin (ANCEF) IVPB 1 g/50 mL premix  Status:  Discontinued     1 g 100 mL/hr over 30 Minutes Intravenous Every 8 hours 05/26/17 1008 05/29/17 2023   05/25/17 1100  vancomycin (VANCOCIN) 1,250 mg in sodium chloride 0.9 % 250 mL IVPB  Status:  Discontinued     1,250 mg 166.7  mL/hr over 90 Minutes Intravenous Every 12 hours 05/25/17 0955 05/26/17 0946   05/22/17 1400  anidulafungin (ERAXIS) 100 mg in sodium chloride 0.9 % 100 mL IVPB     100 mg 78 mL/hr over 100 Minutes Intravenous Every 24 hours 05/21/17 1330 05/27/17 1740   05/21/17 1400  anidulafungin (ERAXIS) 200 mg in sodium chloride 0.9 % 200 mL IVPB     200 mg 78 mL/hr over 200 Minutes Intravenous  Once 05/21/17 1330 05/21/17 1940   05/15/17 1000  anidulafungin (ERAXIS) 100 mg in sodium chloride 0.9 % 100 mL IVPB  Status:  Discontinued     100 mg 78 mL/hr over 100 Minutes Intravenous Every 24 hours 05/14/17 0829 05/16/17 0901   05/15/17 1000  metroNIDAZOLE (FLAGYL) IVPB 500 mg  Status:  Discontinued     500 mg 100 mL/hr over 60 Minutes Intravenous Every 8 hours 05/15/17 0903 05/25/17 0938   05/14/17 0900  anidulafungin (ERAXIS) 200 mg in sodium chloride 0.9 % 200 mL IVPB     200 mg 78 mL/hr over 200 Minutes Intravenous  Once 05/14/17 0829 05/14/17 1325   05/13/17 1927  vancomycin (VANCOCIN) 1-5 GM/200ML-% IVPB    Comments:  Ward, Christa   : cabinet override      05/13/17 1927 05/14/17 0744   05/12/17 1400  ceFEPIme (MAXIPIME) 1 g in dextrose 5 % 50 mL IVPB     1 g 100 mL/hr over 30 Minutes Intravenous Every 8 hours 05/12/17 0939 05/26/17 2359   05/12/17 0900  vancomycin (VANCOCIN) IVPB 1000 mg/200 mL premix  Status:  Discontinued     1,000 mg 200 mL/hr over 60 Minutes Intravenous Every 12 hours 05/12/17 0750 05/16/17 0852   05/12/17 0830  aztreonam (AZACTAM) 2 GM IVPB     2 g 100 mL/hr over 30 Minutes Intravenous  Once 05/12/17 0800 05/12/17 0957   05/06/17 0916  vancomycin (VANCOCIN) IVPB 1000 mg/200 mL premix     1,000 mg 200 mL/hr over 60 Minutes Intravenous On call to O.R. 05/06/17 0916 05/06/17 1229       Assessment/Plan Acute respiratory failure/ post op HCAP pneumonia/post op pleural effusion - CCM/extubated 05/28/17 Malnutrition - PICC/TNA 05/14/17; albumin 2.3 and preablumin 13.3 on  10/22 Oral thrush  Hx of hypertension Hx of anemia Hx of anxiety Hx of hypothyroid Chronic pain medicine use - neck pain - Pt being followed by Palliative to assist with pain control. Tobacco use  Adrenal mass/hx of prior hemicolectomy/ileocolic anastomosis 0973 1. S/p laparoscopic converted to open right adrenalectomy, small bowel resection with anastomosis, placement of 20cm^2 wound vac, 05/06/17, Dr. Lurena Joiner [name] 2. Post op wound dehiscence - primary repair of small bowel anastomotic leak, abdominal closure of wound dehisence, 05/13/17, Lurena Joiner  [name] 3. S/p reopening of recent laparotomy, lysis of adhesions, small bowel resection, placement 200cm^2 negative pressure dressing, 05/14/17, Luke [name] 4. S/p reopening of recent laparotomy, creation of tube ileostomy, placement of 100cm2 negative pressure dressing, 05/17/17, Luke [name] 5. S/p abdominal wash out, placement of mesh for abdominal closure, placement of wound vac of 100cm^2, 05/20/17, Luke [name] 6. S/p Bronchoscopy 05/22/17 Dr. Simonne Maffucci EC fistula 06/13/17 - noted on exam  7. Laparoscopy, laparotomy, extensive enterolysis with removal of mesh (partially) in the right upper quadrant; resection of old anastomosis and ileotransverse anastomosis, 07/31/17,Dr.Matthew Hassell Done  - continue dressing changes - continue therapies - encourage PO intake, off TPN, PICC removed - continuereglan 10mg  q8h for nausea and gut motility - will discuss increasing with palliative care - remeron qhs to stimulate appetite  - restarted on Eraxis for oral thrush - palliative care assisting in pain management - wife wanting to know if he could have some liquid pain medication (hycet or codeine?) to help wean off the IV pain medication  FEN: Regular diet 10/19; TPN stopped 10/29 ID: Vancomycin 7/11- 05/25/17, Maixpime 7/17-7/31/18, Eraxis 7/19-05/27/17, Diflucan 9/5-9/14,&10/16-10/21/18; Cipro/ Flagyl pre-op 07/31/17; Eraxis  10/30>> DVT: Lovenox, SCDs Foley: Out Follow up: Hassell Done  Plan: Pain management per palliative care. Encourage PO intake. Nausea improved slightly. CXR and CT abd/pelvis negative for infectious process. Check UA and culture. WBC trending back down   LOS: 113 days    Brigid Re , [facility] Surgery 08/27/2017, 10:45 AM Pager: [phone] Consults: [phone] Mon-Fri 7:00 am-4:30 pm Sat-Sun 7:00 am-11:30 am

## 2017-08-27 NOTE — Progress Notes (Signed)
Pt walked around the unit today with front wheel walker and one assist. Pt has privileges to go off the unit and so he did today accompanied by the NT. Pt stated he enjoyed the weather outside as well as the fresh air.

## 2017-08-27 NOTE — Progress Notes (Signed)
PT Cancellation Note  Patient Details Name: [name] [name] MRN: [id number] DOB: [birth year]   Cancelled Treatment:     pt amb unit with nursing staff.  Will cont to check on mobility progress.    [name]  PTA [facility]  Acute  Rehab Pager      [phone]

## 2017-08-28 LAB — BASIC METABOLIC PANEL
Anion gap: 10 (ref 5–15)
BUN: 23 mg/dL — AB (ref 6–20)
CO2: 21 mmol/L — ABNORMAL LOW (ref 22–32)
CREATININE: 0.83 mg/dL (ref 0.61–1.24)
Calcium: 9.7 mg/dL (ref 8.9–10.3)
Chloride: 110 mmol/L (ref 101–111)
GFR calc Af Amer: >60 mL/min (ref >60)
Glucose, Bld: 92 mg/dL (ref 65–99)
POTASSIUM: 3.9 mmol/L (ref 3.5–5.1)
SODIUM: 141 mmol/L (ref 135–145)

## 2017-08-28 LAB — URINE CULTURE: Culture: NO GROWTH

## 2017-08-28 MED ORDER — SODIUM CHLORIDE 0.9 % IV BOLUS (SEPSIS)
500.0000 mL | Freq: Once | INTRAVENOUS | Status: AC
  Administered 2017-08-28: 500 mL via INTRAVENOUS

## 2017-08-28 NOTE — Progress Notes (Signed)
Spoke with pharmacy about dose of Eraxis and the possibility if it causing an increase in Alk phos. Alk phos and LFT's were elevated on 10/29. CMP pending for tomorrow morning to check LFT's and Alk phos. If both are still elevated may need to consider an alternative treatment for pt's thrush.   Jackson Latino, [facility] Surgery Pager [phone]

## 2017-08-28 NOTE — Progress Notes (Signed)
[facility]/Trauma Progress Note  28 Days Post-Op   Assessment/Plan Acute respiratory failure/ post op HCAP pneumonia/post op pleural effusion - CCM/extubated 05/28/17 Malnutrition - PICC/TNA 05/14/17; albumin 2.3 and preablumin 13.3 on 10/22 Oral thrush  Hx of hypertension Hx of anemia Hx of anxiety Hx of hypothyroid Chronic pain medicine use - neck pain - Pt being followed by Palliative to assist with pain control. Tobacco use  Adrenal mass/hx of prior hemicolectomy/ileocolic anastomosis 4656 1. S/p laparoscopic converted to open right adrenalectomy, small bowel resection with anastomosis, placement of 20cm^2 wound vac, 05/06/17, Dr. Lurena Joiner [name] 2. Post op wound dehiscence - primary repair of small bowel anastomotic leak, abdominal closure of wound dehisence, 05/13/17, Luke [name] 3. S/p reopening of recent laparotomy, lysis of adhesions, small bowel resection, placement 200cm^2 negative pressure dressing, 05/14/17, Luke [name] 4. S/p reopening of recent laparotomy, creation of tube ileostomy, placement of 100cm2 negative pressure dressing, 05/17/17, Luke [name] 5. S/p abdominal wash out, placement of mesh for abdominal closure, placement of wound vac of 100cm^2, 05/20/17, Luke [name] 6. S/p Bronchoscopy 05/22/17 Dr. Simonne Maffucci EC fistula 06/13/17 - noted on exam  7. Laparoscopy, laparotomy, extensive enterolysis with removal of mesh (partially) in the right upper quadrant; resection of old anastomosis and ileotransverse anastomosis, 07/31/17,Dr.Matthew Hassell Done  - continue dressing changes - continue therapies - encourage PO intake, off TPN, PICC removed - continuereglan 10mg  q8h for nausea and gut motility - will discuss increasing with palliative care - remeron qhs to stimulate appetite  - restarted on Eraxis for oral thrush - palliative care assisting in pain management - wife wanting to know if he could have some liquid pain medication (hycet or  codeine?)to help wean off the IV pain medication, we appreciate palliative care's assistance  FEN: Regular diet 10/19; TPN stopped 10/29 ID: Vancomycin 7/11- 05/25/17, Maixpime 7/17-7/31/18, Eraxis 7/19-05/27/17, Diflucan 9/5-9/14,&10/16-10/21/18; Cipro/ Flagyl pre-op 07/31/17; Eraxis 10/30>> DVT: Lovenox, SCDs Foley: Out Follow up: Hassell Done  Plan: Pain management per palliative care. Encourage PO intake. Nausea improving. CXR and CT abd/pelvis negative for infectious process. Check UA and culture. WBC trending back down     LOS: 114 days    Subjective:  CC: nausea and abdominal pain  Nausea is improving but persistant. Pain is well controlled. Pt really wants to go home  Objective: Vital signs in last 24 hours: Temp:  [98 F (36.7 C)-98.9 F (37.2 C)] 98.6 F (37 C) (11/02 0940) Pulse Rate:  [81-95] 83 (11/02 0940) Resp:  [14-17] 17 (11/02 0940) BP: (110-122)/(88-94) 115/91 (11/02 0940) SpO2:  [95 %-99 %] 99 % (11/02 0940) Last BM Date: 08/27/17  Intake/Output from previous day: 11/01 0701 - 11/02 0700 In: 766.9 [P.O.:480; I.V.:26.9; IV Piggyback:260] Out: -  Intake/Output this shift: No intake/output data recorded.  PE: Gen: Alert, NAD, pleasant Card: Regular rate and rhythm Pulm: rate and effort normal Abd: Soft, not distended, +BS; wounds as pictured below         Anti-infectives: Anti-infectives    Start     Dose/Rate Route Frequency Ordered Stop   08/25/17 1200  anidulafungin (ERAXIS) 200 mg in sodium chloride 0.9 % 200 mL IVPB     200 mg 78 mL/hr over 200 Minutes Intravenous Every 24 hours 08/25/17 1047     08/11/17 1400  fluconazole (DIFLUCAN) IVPB 100 mg  Status:  Discontinued     100 mg 50 mL/hr over 60 Minutes Intravenous Every 24 hours 08/11/17 1312 08/16/17 1847   08/01/17 0600  metroNIDAZOLE (FLAGYL) IVPB  500 mg  Status:  Discontinued     500 mg 100 mL/hr over 60 Minutes Intravenous On call to O.R. 07/31/17 1656 07/31/17 1709    08/01/17 0600  ciprofloxacin (CIPRO) IVPB 400 mg  Status:  Discontinued     400 mg 200 mL/hr over 60 Minutes Intravenous On call to O.R. 07/31/17 1656 07/31/17 1709   07/31/17 0556  metroNIDAZOLE (FLAGYL) IVPB 500 mg     500 mg 100 mL/hr over 60 Minutes Intravenous On call to O.R. 07/31/17 0556 07/31/17 0840   07/31/17 0556  ciprofloxacin (CIPRO) IVPB 400 mg     400 mg 200 mL/hr over 60 Minutes Intravenous On call to O.R. 07/31/17 0556 07/31/17 0914   07/08/17 1600  fluconazole (DIFLUCAN) IVPB 100 mg     100 mg 50 mL/hr over 60 Minutes Intravenous Every 24 hours 07/08/17 0828 07/14/17 1710   07/01/17 1500  fluconazole (DIFLUCAN) IVPB 100 mg     100 mg 50 mL/hr over 60 Minutes Intravenous Every 24 hours 07/01/17 1401 07/07/17 1715   06/16/17 1200  piperacillin-tazobactam (ZOSYN) IVPB 3.375 g     3.375 g 12.5 mL/hr over 240 Minutes Intravenous Every 8 hours 06/16/17 1026 06/17/17 0004   06/08/17 1200  piperacillin-tazobactam (ZOSYN) IVPB 3.375 g  Status:  Discontinued     3.375 g 12.5 mL/hr over 240 Minutes Intravenous Every 8 hours 06/08/17 1056 06/16/17 1026   05/29/17 2200  ceFEPIme (MAXIPIME) 2 g in dextrose 5 % 50 mL IVPB  Status:  Discontinued     2 g 100 mL/hr over 30 Minutes Intravenous Every 8 hours 05/29/17 2031 06/03/17 0832   05/29/17 2100  metroNIDAZOLE (FLAGYL) IVPB 500 mg  Status:  Discontinued     500 mg 100 mL/hr over 60 Minutes Intravenous Every 8 hours 05/29/17 2029 06/03/17 0832   05/26/17 2200  ceFAZolin (ANCEF) IVPB 1 g/50 mL premix  Status:  Discontinued     1 g 100 mL/hr over 30 Minutes Intravenous Every 8 hours 05/26/17 1008 05/29/17 2023   05/25/17 1100  vancomycin (VANCOCIN) 1,250 mg in sodium chloride 0.9 % 250 mL IVPB  Status:  Discontinued     1,250 mg 166.7 mL/hr over 90 Minutes Intravenous Every 12 hours 05/25/17 0955 05/26/17 0946   05/22/17 1400  anidulafungin (ERAXIS) 100 mg in sodium chloride 0.9 % 100 mL IVPB     100 mg 78 mL/hr over 100 Minutes  Intravenous Every 24 hours 05/21/17 1330 05/27/17 1740   05/21/17 1400  anidulafungin (ERAXIS) 200 mg in sodium chloride 0.9 % 200 mL IVPB     200 mg 78 mL/hr over 200 Minutes Intravenous  Once 05/21/17 1330 05/21/17 1940   05/15/17 1000  anidulafungin (ERAXIS) 100 mg in sodium chloride 0.9 % 100 mL IVPB  Status:  Discontinued     100 mg 78 mL/hr over 100 Minutes Intravenous Every 24 hours 05/14/17 0829 05/16/17 0901   05/15/17 1000  metroNIDAZOLE (FLAGYL) IVPB 500 mg  Status:  Discontinued     500 mg 100 mL/hr over 60 Minutes Intravenous Every 8 hours 05/15/17 0903 05/25/17 0938   05/14/17 0900  anidulafungin (ERAXIS) 200 mg in sodium chloride 0.9 % 200 mL IVPB     200 mg 78 mL/hr over 200 Minutes Intravenous  Once 05/14/17 0829 05/14/17 1325   05/13/17 1927  vancomycin (VANCOCIN) 1-5 GM/200ML-% IVPB    Comments:  Ward, Christa   : cabinet override      05/13/17 1927 05/14/17 0744  05/12/17 1400  ceFEPIme (MAXIPIME) 1 g in dextrose 5 % 50 mL IVPB     1 g 100 mL/hr over 30 Minutes Intravenous Every 8 hours 05/12/17 0939 05/26/17 2359   05/12/17 0900  vancomycin (VANCOCIN) IVPB 1000 mg/200 mL premix  Status:  Discontinued     1,000 mg 200 mL/hr over 60 Minutes Intravenous Every 12 hours 05/12/17 0750 05/16/17 0852   05/12/17 0830  aztreonam (AZACTAM) 2 GM IVPB     2 g 100 mL/hr over 30 Minutes Intravenous  Once 05/12/17 0800 05/12/17 0957   05/06/17 0916  vancomycin (VANCOCIN) IVPB 1000 mg/200 mL premix     1,000 mg 200 mL/hr over 60 Minutes Intravenous On call to O.R. 05/06/17 0916 05/06/17 1229      Lab Results:   Recent Labs  08/27/17 0506  WBC 11.3*  HGB 11.8*  HCT 37.8*  PLT 576*   BMET  Recent Labs  08/27/17 0506 08/28/17 0533  NA 140 141  K 4.2 3.9  CL 108 110  CO2 20* 21*  GLUCOSE 96 92  BUN 22* 23*  CREATININE 0.82 0.83  CALCIUM 10.1 9.7   PT/INR No results for input(s): LABPROT, INR in the last 72 hours. CMP     Component Value Date/Time   NA 141  08/28/2017 0533   K 3.9 08/28/2017 0533   CL 110 08/28/2017 0533   CO2 21 (L) 08/28/2017 0533   GLUCOSE 92 08/28/2017 0533   BUN 23 (H) 08/28/2017 0533   CREATININE 0.83 08/28/2017 0533   CREATININE 0.85 07/22/2013 1021   CALCIUM 9.7 08/28/2017 0533   PROT 8.1 08/24/2017 0619   ALBUMIN 3.3 (L) 08/24/2017 0619   AST 75 (H) 08/24/2017 0619   ALT 156 (H) 08/24/2017 0619   ALKPHOS 225 (H) 08/24/2017 0619   BILITOT 0.5 08/24/2017 0619   GFRNONAA >60 08/28/2017 0533   GFRAA >60 08/28/2017 0533   Lipase     Component Value Date/Time   LIPASE 27 09/15/2015 0934    Studies/Results: No results found.    Kalman Drape , [facility] Surgery 08/28/2017, 10:17 AM Pager: [phone] Consults: [phone] Mon-Fri 7:00 am-4:30 pm Sat-Sun 7:00 am-11:30 am

## 2017-08-28 NOTE — Progress Notes (Signed)
Pharmacy Antibiotic Note  [name] [name] is a 56 y.o. male admitted on 05/06/2017 with severe esophageal and oropharyngeal canidiasis.  Pharmacy has been consulted for Eraxis dosing.  Plan:  Continue Eraxis 200 mg IV q24h   Using higher dose as per IDSA guidelines as there is concern that pt may have azole resistant candidiasis as pt has received fluconazole during stay. Per guidelines, recommended LOT for azole resistant esophageal candidiasis is 14-21 days.   Height: 5\' 10"  (177.8 cm) Weight: 165 lb 9.1 oz (75.1 kg) IBW/kg (Calculated) : 73  Temp (24hrs), Avg:98.4 F (36.9 C), Min:98 F (36.7 C), Max:98.9 F (37.2 C)   Recent Labs Lab 08/24/17 0619 08/25/17 0314 08/26/17 0302 08/27/17 0506 08/28/17 0533  WBC 10.7* 13.1*  --  11.3*  --   CREATININE 0.69 0.71 0.79 0.82 0.83    Estimated Creatinine Clearance: 102.6 mL/min (by C-G formula based on SCr of 0.83 mg/dL).    Allergies  Allergen Reactions  . Chantix [Varenicline] Palpitations    Heart race Increased panic attacks  . Morphine Itching  . Penicillins Rash    Has patient had a PCN reaction causing immediate rash, facial/tongue/throat swelling, SOB or lightheadedness with hypotension: Yes Has patient had a PCN reaction causing severe rash involving mucus membranes or skin necrosis: Unknown Has patient had a PCN reaction that required hospitalization: No Has patient had a PCN reaction occurring within the last 10 years: No If all of the above answers are "NO", then may proceed with Cephalosporin use. Tolerating Zosyn 05/2017     Antimicrobials this admission: 7/17 Vancomcyin >> 7/21, resume 7/30 >> 7/31 7/17 Cefepime >> 7/31, resume 8/3 >> 8/8 7/19 Anidulafungin >> 7/21, resume 7/26 >> 8/1 7/20 metronidazole >> 7/30, resume 8/3 >> 8/8 7/31 Cefazolin >> 8/3 8/13 Zosyn >> 8/21 8/22 Nystatin for thrush>>9/4; 10/15>> 10/16 9/4 Diflucan (esophageal candidiasis) >> 9/18, 10/16 >> 10/21 10/30 anidulafungin >>     Dose adjustments this admission: ---  Microbiology results: 7/11 MRSA PCR: negative 7/18 Abdominal culture:  moderate Klebsiella pna - Pan sens except amp 7/19 BCx: NGF 7/22 Trach asp: multple orgs, none predominant 7/27 Trach asp: rare MSSA 7/27 BCx: 1/2 CoNS 8/4 perihepatic fluid: NGF 8/16 MRSA PCR: negative 10/29 Fungus culture ( throat ):    Thank you for allowing pharmacy to be a part of this patient's care.   Reuel Boom, PharmD, BCPS Pager: [phone] 08/28/2017, 12:55 PM

## 2017-08-28 NOTE — Progress Notes (Signed)
Physical Therapy Treatment Patient Details Name: [name] [name] MRN: [id number] DOB: [birth year] Today's Date: 08/28/2017    History of Present Illness  56 y.o. male admitted 7/11 with right adrenal mass , s/p  right adrenalectomy complicated by small bowel injury requiring resection with anastomosis . Has required multiple surgeris, the last being 10/5 for  Laparoscopy, laparotomy, extensive enterolysis with removal of mesh (partially) in the right upper quadrant; resection of old anastomosis and ileotransverse anastomosiss    PT Comments    Assisted OOB to amb a greater distance avg HR 111.  See mobility details below.   Follow Up Recommendations  SNF;Supervision/Assistance - 24 hour;Home health PT (pending medical progress)     Equipment Recommendations  Rolling walker with 5" wheels    Recommendations for Other Services       Precautions / Restrictions Precautions Precautions: Fall Precaution Comments: abdominal wounds, left knee is buckling  a little Required Braces or Orthoses: Other Brace/Splint Other Brace/Splint: ABD binder    Mobility  Bed Mobility Overal bed mobility: Needs Assistance Bed Mobility: Supine to Sit;Sit to Supine     Supine to sit: Min guard Sit to supine: Min guard   General bed mobility comments: increased time needed with elvated bed   Transfers Overall transfer level: Needs assistance Equipment used: Rolling walker (2 wheeled) Transfers: Sit to/from Stand Sit to Stand: Min guard Stand pivot transfers: Min guard       General transfer comment: increased time and one VC safety with turns  Ambulation/Gait Ambulation/Gait assistance: Min guard Ambulation Distance (Feet): 450 Feet   Gait Pattern/deviations: Step-to pattern;Step-through pattern;Decreased step length - left;Decreased stride length;Shuffle;Antalgic Gait velocity: WFL   General Gait Details: good alternating gait with avg HR 111   Stairs            Wheelchair  Mobility    Modified Rankin (Stroke Patients Only)       Balance                                            Cognition Arousal/Alertness: Awake/alert Behavior During Therapy: WFL for tasks assessed/performed;Flat affect Overall Cognitive Status: Within Functional Limits for tasks assessed                                        Exercises      General Comments        Pertinent Vitals/Pain Pain Assessment: Faces Faces Pain Scale: Hurts a little bit Pain Location: left  medial knee, Pain Descriptors / Indicators: Grimacing Pain Intervention(s): Monitored during session;Repositioned    Home Living                      Prior Function            PT Goals (current goals can now be found in the care plan section) Progress towards PT goals: Progressing toward goals    Frequency    Min 3X/week      PT Plan Current plan remains appropriate    Co-evaluation              AM-PAC PT "6 Clicks" Daily Activity  Outcome Measure  Difficulty turning over in bed (including adjusting bedclothes, sheets and blankets)?: A Lot Difficulty moving from lying on back  to sitting on the side of the bed? : A Lot Difficulty sitting down on and standing up from a chair with arms (e.g., wheelchair, bedside commode, etc,.)?: A Lot Help needed moving to and from a bed to chair (including a wheelchair)?: A Lot Help needed walking in hospital room?: A Lot Help needed climbing 3-5 steps with a railing? : A Lot 6 Click Score: 12    End of Session Equipment Utilized During Treatment: Gait belt Activity Tolerance: Patient tolerated treatment well Patient left: in bed;with call bell/phone within reach;with family/visitor present Nurse Communication: Mobility status PT Visit Diagnosis: Difficulty in walking, not elsewhere classified (R26.2);Muscle weakness (generalized) (M62.81)     Time: 3546-5681 PT Time Calculation (min) (ACUTE ONLY): 14  min  Charges:  $Gait Training: 8-22 mins                    G Codes:       {Keane Martelli  PTA [facility]  Acute  Rehab Pager      [phone]

## 2017-08-28 NOTE — Progress Notes (Signed)
Wife spoke with this RN and said that she emptied 150cc of dark amber urine from pt with afternoon and there is no other output charted on pt. Only about 240cc of oral intake charted since midnight. Pt with poor tolerance of pos d/t nausea. Spoke with Dr. Excell Seltzer and order given for NS bolus at this time. Will implement order and update wife as well.

## 2017-08-28 NOTE — Progress Notes (Signed)
   08/28/17 1619  Clinical Encounter Type  Visited With Patient and family together  Visit Type Follow-up  Spiritual Encounters  Spiritual Needs Emotional;Prayer   Was glad to see him in a new room out of ICU.  Patient seemed a little more hopeful this week.  He wants to get better to get a plan to get out of the hospital.  Hopes to know soon when he might get out.  We shared some laughter and prayer.  Will follow as needed. Chaplain Katherene Ponto

## 2017-08-29 LAB — COMPREHENSIVE METABOLIC PANEL
ALBUMIN: 3.1 g/dL — AB (ref 3.5–5.0)
ALT: 151 U/L — ABNORMAL HIGH (ref 17–63)
AST: 59 U/L — AB (ref 15–41)
Alkaline Phosphatase: 173 U/L — ABNORMAL HIGH (ref 38–126)
Anion gap: 7 (ref 5–15)
BUN: 18 mg/dL (ref 6–20)
CHLORIDE: 111 mmol/L (ref 101–111)
CO2: 24 mmol/L (ref 22–32)
Calcium: 9.5 mg/dL (ref 8.9–10.3)
Creatinine, Ser: 0.97 mg/dL (ref 0.61–1.24)
GFR calc Af Amer: >60 mL/min (ref >60)
GFR calc non Af Amer: >60 mL/min (ref >60)
Glucose, Bld: 86 mg/dL (ref 65–99)
POTASSIUM: 3.9 mmol/L (ref 3.5–5.1)
Sodium: 142 mmol/L (ref 135–145)
Total Bilirubin: 0.8 mg/dL (ref 0.3–1.2)
Total Protein: 6.7 g/dL (ref 6.5–8.1)

## 2017-08-29 LAB — CBC
HEMATOCRIT: 33.7 % — AB (ref 39.0–52.0)
Hemoglobin: 10.5 g/dL — ABNORMAL LOW (ref 13.0–17.0)
MCH: 24.2 pg — ABNORMAL LOW (ref 26.0–34.0)
MCHC: 31.2 g/dL (ref 30.0–36.0)
MCV: 77.8 fL — AB (ref 78.0–100.0)
Platelets: 496 10*3/uL — ABNORMAL HIGH (ref 150–400)
RBC: 4.33 MIL/uL (ref 4.22–5.81)
RDW: 20.2 % — AB (ref 11.5–15.5)
WBC: 9.6 10*3/uL (ref 4.0–10.5)

## 2017-08-29 NOTE — Progress Notes (Signed)
Pharmacy Antibiotic Note  [name] [name] is a 56 y.o. male admitted on 05/06/2017 with severe esophageal and oropharyngeal canidiasis.  Pharmacy has been consulted for Eraxis dosing.  Plan:  Continue Eraxis 200 mg IV q24h   Using higher dose as per IDSA guidelines as there is concern that pt may have azole resistant candidiasis as pt has received fluconazole during stay. Per guidelines, recommended LOT for azole resistant esophageal candidiasis is 14-21 days.   LFT drawn today, 11/3. AST/ALT is 59/151, which is similar to last labs drawn on 10/29 of 75/156 while pt was on TPN and was not on Eraxis yet. Alkaline phosphatase is 173, which is also lower than level on 10/29, which was 225. Bilirubin WNL, consistent with previous result  Will monitor LFTs as indicated depending on length of therapy   Height: 5\' 10"  (177.8 cm) Weight: 165 lb 9.1 oz (75.1 kg) IBW/kg (Calculated) : 73  Temp (24hrs), Avg:98.4 F (36.9 C), Min:98 F (36.7 C), Max:98.6 F (37 C)   Recent Labs Lab 08/24/17 0619 08/25/17 0314 08/26/17 0302 08/27/17 0506 08/28/17 0533 08/29/17 0503  WBC 10.7* 13.1*  --  11.3*  --  9.6  CREATININE 0.69 0.71 0.79 0.82 0.83 0.97    Estimated Creatinine Clearance: 87.8 mL/min (by C-G formula based on SCr of 0.97 mg/dL).    Allergies  Allergen Reactions  . Chantix [Varenicline] Palpitations    Heart race Increased panic attacks  . Morphine Itching  . Penicillins Rash    Has patient had a PCN reaction causing immediate rash, facial/tongue/throat swelling, SOB or lightheadedness with hypotension: Yes Has patient had a PCN reaction causing severe rash involving mucus membranes or skin necrosis: Unknown Has patient had a PCN reaction that required hospitalization: No Has patient had a PCN reaction occurring within the last 10 years: No If all of the above answers are "NO", then may proceed with Cephalosporin use. Tolerating Zosyn 05/2017     Antimicrobials this  admission: 7/17 Vancomcyin >> 7/21, resume 7/30 >> 7/31 7/17 Cefepime >> 7/31, resume 8/3 >> 8/8 7/19 Anidulafungin >> 7/21, resume 7/26 >> 8/1 7/20 metronidazole >> 7/30, resume 8/3 >> 8/8 7/31 Cefazolin >> 8/3 8/13 Zosyn >> 8/21 8/22 Nystatin for thrush>>9/4; 10/15>> 10/16 9/4 Diflucan (esophageal candidiasis) >> 9/18, 10/16 >> 10/21 10/30 anidulafungin >>    Dose adjustments this admission: ---  Microbiology results: 7/11 MRSA PCR: negative 7/18 Abdominal culture:  moderate Klebsiella pna - Pan sens except amp 7/19 BCx: NGF 7/22 Trach asp: multple orgs, none predominant 7/27 Trach asp: rare MSSA 7/27 BCx: 1/2 CoNS 8/4 perihepatic fluid: NGF 8/16 MRSA PCR: negative 10/29 Fungus culture ( throat ): yeast obeserved 11/1 urine Cx:  NGF    Thank you for allowing pharmacy to be a part of this patient's care.   Royetta Asal, PharmD, BCPS Pager [phone] 08/29/2017 9:30 AM

## 2017-08-29 NOTE — Progress Notes (Signed)
Assessment  Adrenal mass/hx of prior hemicolectomy/ileocolic anastomosis 2202 1. S/p laparoscopic converted to open right adrenalectomy, small bowel resection with anastomosis, placement of 20cm^2 wound vac, 05/06/17, Dr. Lurena Joiner [name] 2. Post op wound dehiscence - primary repair of small bowel anastomotic leak, abdominal closure of wound dehisence, 05/13/17, Luke [name] 3. S/p reopening of recent laparotomy, lysis of adhesions, small bowel resection, placement 200cm^2 negative pressure dressing, 05/14/17, Luke [name] 4. S/p reopening of recent laparotomy, creation of tube ileostomy, placement of 100cm2 negative pressure dressing, 05/17/17, Luke [name] 5. 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 Laparoscopy, laparotomy, extensive enterolysis with removal of mesh (partially) in the right upper quadrant; resection of old anastomosis and ileotransverse anastomosis, 07/31/17,Dr.Matthew Hassell Done  - continue dressing changes - continue therapies - still not eating of drinking much; IVF given this AM for some dehydrationencourage PO intake, off TPN, PICC removed - continuereglan 1m q8h for nausea and gut motility -  - remeron qhs to stimulate appetite  - restarted on Eraxis for oral thrush; Alk phos and AST slightly higher today - palliative care assisting in pain management  FEN: Regular diet 10/19; TPN stopped 10/29 ID: Vancomycin 7/11- 05/25/17, Maixpime 7/17-7/31/18, Eraxis 7/19-05/27/17, Diflucan 9/5-9/14,&10/16-10/21/18; Cipro/ Flagyl pre-op 07/31/17; Eraxis 10/30>> DVT: Lovenox, SCDs  Plan:  Encouraged increased oral intake and hydration.  Recheck LFTs 11/5 and if still trending up, consider changing Eraxis.   LOS: 115 days     29 Days Post-Op  Chief Complaint/Subjective: No specific complaints this morning.  Objective: Vital signs in last 24  hours: Temp:  [98 F (36.7 C)-98.6 F (37 C)] 98 F (36.7 C) (11/03 0622) Pulse Rate:  [74-100] 74 (11/03 0622) Resp:  [17-20] 18 (11/03 0622) BP: (106-135)/(82-93) 106/82 (11/03 0622) SpO2:  [97 %-100 %] 100 % (11/03 0622) Last BM Date: 08/28/17  Intake/Output from previous day: No intake/output data recorded. Intake/Output this shift: No intake/output data recorded.  PE: General- In NAD.  Awake and alert. Abdomen-soft, open wounds are clean with no purulent drainage  Lab Results:   Recent Labs  08/27/17 0506 08/29/17 0503  WBC 11.3* 9.6  HGB 11.8* 10.5*  HCT 37.8* 33.7*  PLT 576* 496*   BMET  Recent Labs  08/28/17 0533 08/29/17 0503  NA 141 142  K 3.9 3.9  CL 110 111  CO2 21* 24  GLUCOSE 92 86  BUN 23* 18  CREATININE 0.83 0.97  CALCIUM 9.7 9.5   PT/INR No results for input(s): LABPROT, INR in the last 72 hours. Comprehensive Metabolic Panel:    Component Value Date/Time   NA 142 08/29/2017 0503   NA 141 08/28/2017 0533   K 3.9 08/29/2017 0503   K 3.9 08/28/2017 0533   CL 111 08/29/2017 0503   CL 110 08/28/2017 0533   CO2 24 08/29/2017 0503   CO2 21 (L) 08/28/2017 0533   BUN 18 08/29/2017 0503   BUN 23 (H) 08/28/2017 0533   CREATININE 0.97 08/29/2017 0503   CREATININE 0.83 08/28/2017 0533   CREATININE 0.85 07/22/2013 1021   GLUCOSE 86 08/29/2017 0503   GLUCOSE 92 08/28/2017 0533   CALCIUM 9.5 08/29/2017 0503   CALCIUM 9.7 08/28/2017 0533   AST 59 (H) 08/29/2017 0503   AST 75 (H) 08/24/2017 0619   ALT 151 (H) 08/29/2017 0503   ALT 156 (H) 08/24/2017 0619   ALKPHOS 173 (H) 08/29/2017  0503   ALKPHOS 225 (H) 08/24/2017 0619   BILITOT 0.8 08/29/2017 0503   BILITOT 0.5 08/24/2017 0619   PROT 6.7 08/29/2017 0503   PROT 8.1 08/24/2017 0619   ALBUMIN 3.1 (L) 08/29/2017 0503   ALBUMIN 3.3 (L) 08/24/2017 8469     Studies/Results: No results found.  Anti-infectives: Anti-infectives    Start     Dose/Rate Route Frequency Ordered Stop    08/25/17 1200  anidulafungin (ERAXIS) 200 mg in sodium chloride 0.9 % 200 mL IVPB     200 mg 78 mL/hr over 200 Minutes Intravenous Every 24 hours 08/25/17 1047     08/11/17 1400  fluconazole (DIFLUCAN) IVPB 100 mg  Status:  Discontinued     100 mg 50 mL/hr over 60 Minutes Intravenous Every 24 hours 08/11/17 1312 08/16/17 1847   08/01/17 0600  metroNIDAZOLE (FLAGYL) IVPB 500 mg  Status:  Discontinued     500 mg 100 mL/hr over 60 Minutes Intravenous On call to O.R. 07/31/17 1656 07/31/17 1709   08/01/17 0600  ciprofloxacin (CIPRO) IVPB 400 mg  Status:  Discontinued     400 mg 200 mL/hr over 60 Minutes Intravenous On call to O.R. 07/31/17 1656 07/31/17 1709   07/31/17 0556  metroNIDAZOLE (FLAGYL) IVPB 500 mg     500 mg 100 mL/hr over 60 Minutes Intravenous On call to O.R. 07/31/17 0556 07/31/17 0840   07/31/17 0556  ciprofloxacin (CIPRO) IVPB 400 mg     400 mg 200 mL/hr over 60 Minutes Intravenous On call to O.R. 07/31/17 0556 07/31/17 0914   07/08/17 1600  fluconazole (DIFLUCAN) IVPB 100 mg     100 mg 50 mL/hr over 60 Minutes Intravenous Every 24 hours 07/08/17 0828 07/14/17 1710   07/01/17 1500  fluconazole (DIFLUCAN) IVPB 100 mg     100 mg 50 mL/hr over 60 Minutes Intravenous Every 24 hours 07/01/17 1401 07/07/17 1715   06/16/17 1200  piperacillin-tazobactam (ZOSYN) IVPB 3.375 g     3.375 g 12.5 mL/hr over 240 Minutes Intravenous Every 8 hours 06/16/17 1026 06/17/17 0004   06/08/17 1200  piperacillin-tazobactam (ZOSYN) IVPB 3.375 g  Status:  Discontinued     3.375 g 12.5 mL/hr over 240 Minutes Intravenous Every 8 hours 06/08/17 1056 06/16/17 1026   05/29/17 2200  ceFEPIme (MAXIPIME) 2 g in dextrose 5 % 50 mL IVPB  Status:  Discontinued     2 g 100 mL/hr over 30 Minutes Intravenous Every 8 hours 05/29/17 2031 06/03/17 0832   05/29/17 2100  metroNIDAZOLE (FLAGYL) IVPB 500 mg  Status:  Discontinued     500 mg 100 mL/hr over 60 Minutes Intravenous Every 8 hours 05/29/17 2029 06/03/17  0832   05/26/17 2200  ceFAZolin (ANCEF) IVPB 1 g/50 mL premix  Status:  Discontinued     1 g 100 mL/hr over 30 Minutes Intravenous Every 8 hours 05/26/17 1008 05/29/17 2023   05/25/17 1100  vancomycin (VANCOCIN) 1,250 mg in sodium chloride 0.9 % 250 mL IVPB  Status:  Discontinued     1,250 mg 166.7 mL/hr over 90 Minutes Intravenous Every 12 hours 05/25/17 0955 05/26/17 0946   05/22/17 1400  anidulafungin (ERAXIS) 100 mg in sodium chloride 0.9 % 100 mL IVPB     100 mg 78 mL/hr over 100 Minutes Intravenous Every 24 hours 05/21/17 1330 05/27/17 1740   05/21/17 1400  anidulafungin (ERAXIS) 200 mg in sodium chloride 0.9 % 200 mL IVPB     200 mg 78 mL/hr over 200 Minutes  Intravenous  Once 05/21/17 1330 05/21/17 1940   05/15/17 1000  anidulafungin (ERAXIS) 100 mg in sodium chloride 0.9 % 100 mL IVPB  Status:  Discontinued     100 mg 78 mL/hr over 100 Minutes Intravenous Every 24 hours 05/14/17 0829 05/16/17 0901   05/15/17 1000  metroNIDAZOLE (FLAGYL) IVPB 500 mg  Status:  Discontinued     500 mg 100 mL/hr over 60 Minutes Intravenous Every 8 hours 05/15/17 0903 05/25/17 0938   05/14/17 0900  anidulafungin (ERAXIS) 200 mg in sodium chloride 0.9 % 200 mL IVPB     200 mg 78 mL/hr over 200 Minutes Intravenous  Once 05/14/17 0829 05/14/17 1325   05/13/17 1927  vancomycin (VANCOCIN) 1-5 GM/200ML-% IVPB    Comments:  Ward, Christa   : cabinet override      05/13/17 1927 05/14/17 0744   05/12/17 1400  ceFEPIme (MAXIPIME) 1 g in dextrose 5 % 50 mL IVPB     1 g 100 mL/hr over 30 Minutes Intravenous Every 8 hours 05/12/17 0939 05/26/17 2359   05/12/17 0900  vancomycin (VANCOCIN) IVPB 1000 mg/200 mL premix  Status:  Discontinued     1,000 mg 200 mL/hr over 60 Minutes Intravenous Every 12 hours 05/12/17 0750 05/16/17 0852   05/12/17 0830  aztreonam (AZACTAM) 2 GM IVPB     2 g 100 mL/hr over 30 Minutes Intravenous  Once 05/12/17 0800 05/12/17 0957   05/06/17 0916  vancomycin (VANCOCIN) IVPB 1000 mg/200  mL premix     1,000 mg 200 mL/hr over 60 Minutes Intravenous On call to O.R. 05/06/17 0916 05/06/17 1229       [name] [name] J 08/29/2017

## 2017-08-30 NOTE — Progress Notes (Signed)
Assessment  Adrenal mass/hx of prior hemicolectomy/ileocolic anastomosis 9532 1. S/p laparoscopic converted to open right adrenalectomy, small bowel resection with anastomosis, placement of 20cm^2 wound vac, 05/06/17, Dr. Lurena Joiner [name] 2. Post op wound dehiscence - primary repair of small bowel anastomotic leak, abdominal closure of wound dehisence, 05/13/17, Luke [name] 3. S/p reopening of recent laparotomy, lysis of adhesions, small bowel resection, placement 200cm^2 negative pressure dressing, 05/14/17, Luke [name] 4. S/p reopening of recent laparotomy, creation of tube ileostomy, placement of 100cm2 negative pressure dressing, 05/17/17, Luke [name] 5. 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 Laparoscopy, laparotomy, extensive enterolysis with removal of mesh (partially) in the right upper quadrant; resection of old anastomosis and ileotransverse anastomosis, 07/31/17,Dr.Matthew Hassell Done  - continue dressing changes - continue therapies - still not eating of drinking much; IVF given this AM for some dehydrationencourage PO intake, off TPN, PICC removed - continuereglan 38m q8h for nausea and gut motility -  - remeron qhs to stimulate appetite  - restarted on Eraxis for oral thrush; Alk phos and AST slightly higher today - palliative care assisting in pain management  FEN: Regular diet 10/19; TPN stopped 10/29 ID: Vancomycin 7/11- 05/25/17, Maixpime 7/17-7/31/18, Eraxis 7/19-05/27/17, Diflucan 9/5-9/14,&10/16-10/21/18; Cipro/ Flagyl pre-op 07/31/17; Eraxis 10/30>> DVT: Lovenox, SCDs  Plan:  Encouraged increased oral intake and hydration.  Recheck LFTs 11/5 and if still trending up, consider changing Eraxis.   LOS: 116 days     30 Days Post-Op  Chief Complaint/Subjective: Feeling better today.  Nausea better. Ate better  yesterday.  Objective: Vital signs in last 24 hours: Temp:  [98.2 F (36.8 C)-98.3 F (36.8 C)] 98.2 F (36.8 C) (11/04 0548) Pulse Rate:  [67-81] 67 (11/04 0548) Resp:  [16-18] 16 (11/04 0932) BP: (110-111)/(75-80) 111/75 (11/04 0548) SpO2:  [98 %-100 %] 100 % (11/04 0932) FiO2 (%):  [99 %] 99 % (11/04 0046) Last BM Date: 08/29/17  Intake/Output from previous day: 11/03 0701 - 11/04 0700 In: 158[P.O.:720; IV Piggyback:520] Out: 200 [Urine:200] Intake/Output this shift: Total I/O In: 120 [P.O.:120] Out: 225 [Urine:225]  PE: General- In NAD.  Awake and alert. Abdomen-soft, open wounds are clean with no purulent drainage  Lab Results:  Recent Labs    08/29/17 0503  WBC 9.6  HGB 10.5*  HCT 33.7*  PLT 496*   BMET Recent Labs    08/28/17 0533 08/29/17 0503  NA 141 142  K 3.9 3.9  CL 110 111  CO2 21* 24  GLUCOSE 92 86  BUN 23* 18  CREATININE 0.83 0.97  CALCIUM 9.7 9.5   PT/INR No results for input(s): LABPROT, INR in the last 72 hours. Comprehensive Metabolic Panel:    Component Value Date/Time   NA 142 08/29/2017 0503   NA 141 08/28/2017 0533   K 3.9 08/29/2017 0503   K 3.9 08/28/2017 0533   CL 111 08/29/2017 0503   CL 110 08/28/2017 0533   CO2 24 08/29/2017 0503   CO2 21 (L) 08/28/2017 0533   BUN 18 08/29/2017 0503   BUN 23 (H) 08/28/2017 0533   CREATININE 0.97 08/29/2017 0503   CREATININE 0.83 08/28/2017 0533   CREATININE 0.85 07/22/2013 1021   GLUCOSE 86 08/29/2017 0503   GLUCOSE 92 08/28/2017 0533   CALCIUM 9.5 08/29/2017 0503   CALCIUM 9.7 08/28/2017 0533   AST 59 (H) 08/29/2017 0503   AST 75 (H)  08/24/2017 0619   ALT 151 (H) 08/29/2017 0503   ALT 156 (H) 08/24/2017 0619   ALKPHOS 173 (H) 08/29/2017 0503   ALKPHOS 225 (H) 08/24/2017 0619   BILITOT 0.8 08/29/2017 0503   BILITOT 0.5 08/24/2017 0619   PROT 6.7 08/29/2017 0503   PROT 8.1 08/24/2017 0619   ALBUMIN 3.1 (L) 08/29/2017 0503   ALBUMIN 3.3 (L) 08/24/2017 3267      Studies/Results: No results found.  Anti-infectives: Anti-infectives (From admission, onward)   Start     Dose/Rate Route Frequency Ordered Stop   08/25/17 1200  anidulafungin (ERAXIS) 200 mg in sodium chloride 0.9 % 200 mL IVPB     200 mg 78 mL/hr over 200 Minutes Intravenous Every 24 hours 08/25/17 1047     08/11/17 1400  fluconazole (DIFLUCAN) IVPB 100 mg  Status:  Discontinued     100 mg 50 mL/hr over 60 Minutes Intravenous Every 24 hours 08/11/17 1312 08/16/17 1847   08/01/17 0600  metroNIDAZOLE (FLAGYL) IVPB 500 mg  Status:  Discontinued     500 mg 100 mL/hr over 60 Minutes Intravenous On call to O.R. 07/31/17 1656 07/31/17 1709   08/01/17 0600  ciprofloxacin (CIPRO) IVPB 400 mg  Status:  Discontinued     400 mg 200 mL/hr over 60 Minutes Intravenous On call to O.R. 07/31/17 1656 07/31/17 1709   07/31/17 0556  metroNIDAZOLE (FLAGYL) IVPB 500 mg     500 mg 100 mL/hr over 60 Minutes Intravenous On call to O.R. 07/31/17 0556 07/31/17 0840   07/31/17 0556  ciprofloxacin (CIPRO) IVPB 400 mg     400 mg 200 mL/hr over 60 Minutes Intravenous On call to O.R. 07/31/17 0556 07/31/17 0914   07/08/17 1600  fluconazole (DIFLUCAN) IVPB 100 mg     100 mg 50 mL/hr over 60 Minutes Intravenous Every 24 hours 07/08/17 0828 07/14/17 1710   07/01/17 1500  fluconazole (DIFLUCAN) IVPB 100 mg     100 mg 50 mL/hr over 60 Minutes Intravenous Every 24 hours 07/01/17 1401 07/07/17 1715   06/16/17 1200  piperacillin-tazobactam (ZOSYN) IVPB 3.375 g     3.375 g 12.5 mL/hr over 240 Minutes Intravenous Every 8 hours 06/16/17 1026 06/17/17 0004   06/08/17 1200  piperacillin-tazobactam (ZOSYN) IVPB 3.375 g  Status:  Discontinued     3.375 g 12.5 mL/hr over 240 Minutes Intravenous Every 8 hours 06/08/17 1056 06/16/17 1026   05/29/17 2200  ceFEPIme (MAXIPIME) 2 g in dextrose 5 % 50 mL IVPB  Status:  Discontinued     2 g 100 mL/hr over 30 Minutes Intravenous Every 8 hours 05/29/17 2031 06/03/17 0832    05/29/17 2100  metroNIDAZOLE (FLAGYL) IVPB 500 mg  Status:  Discontinued     500 mg 100 mL/hr over 60 Minutes Intravenous Every 8 hours 05/29/17 2029 06/03/17 0832   05/26/17 2200  ceFAZolin (ANCEF) IVPB 1 g/50 mL premix  Status:  Discontinued     1 g 100 mL/hr over 30 Minutes Intravenous Every 8 hours 05/26/17 1008 05/29/17 2023   05/25/17 1100  vancomycin (VANCOCIN) 1,250 mg in sodium chloride 0.9 % 250 mL IVPB  Status:  Discontinued     1,250 mg 166.7 mL/hr over 90 Minutes Intravenous Every 12 hours 05/25/17 0955 05/26/17 0946   05/22/17 1400  anidulafungin (ERAXIS) 100 mg in sodium chloride 0.9 % 100 mL IVPB     100 mg 78 mL/hr over 100 Minutes Intravenous Every 24 hours 05/21/17 1330 05/27/17 1740   05/21/17 1400  anidulafungin (ERAXIS) 200 mg in sodium chloride 0.9 % 200 mL IVPB     200 mg 78 mL/hr over 200 Minutes Intravenous  Once 05/21/17 1330 05/21/17 1940   05/15/17 1000  anidulafungin (ERAXIS) 100 mg in sodium chloride 0.9 % 100 mL IVPB  Status:  Discontinued     100 mg 78 mL/hr over 100 Minutes Intravenous Every 24 hours 05/14/17 0829 05/16/17 0901   05/15/17 1000  metroNIDAZOLE (FLAGYL) IVPB 500 mg  Status:  Discontinued     500 mg 100 mL/hr over 60 Minutes Intravenous Every 8 hours 05/15/17 0903 05/25/17 0938   05/14/17 0900  anidulafungin (ERAXIS) 200 mg in sodium chloride 0.9 % 200 mL IVPB     200 mg 78 mL/hr over 200 Minutes Intravenous  Once 05/14/17 0829 05/14/17 1325   05/13/17 1927  vancomycin (VANCOCIN) 1-5 GM/200ML-% IVPB    Comments:  Ward, Christa   : cabinet override      05/13/17 1927 05/14/17 0744   05/12/17 1400  ceFEPIme (MAXIPIME) 1 g in dextrose 5 % 50 mL IVPB     1 g 100 mL/hr over 30 Minutes Intravenous Every 8 hours 05/12/17 0939 05/26/17 2359   05/12/17 0900  vancomycin (VANCOCIN) IVPB 1000 mg/200 mL premix  Status:  Discontinued     1,000 mg 200 mL/hr over 60 Minutes Intravenous Every 12 hours 05/12/17 0750 05/16/17 0852   05/12/17 0830   aztreonam (AZACTAM) 2 GM IVPB     2 g 100 mL/hr over 30 Minutes Intravenous  Once 05/12/17 0800 05/12/17 0957   05/06/17 0916  vancomycin (VANCOCIN) IVPB 1000 mg/200 mL premix     1,000 mg 200 mL/hr over 60 Minutes Intravenous On call to O.R. 05/06/17 0916 05/06/17 1229       Bedford Winsor J 08/30/2017

## 2017-08-31 LAB — CBC
HCT: 30.5 % — ABNORMAL LOW (ref 39.0–52.0)
Hemoglobin: 9.6 g/dL — ABNORMAL LOW (ref 13.0–17.0)
MCH: 24.4 pg — AB (ref 26.0–34.0)
MCHC: 31.5 g/dL (ref 30.0–36.0)
MCV: 77.4 fL — AB (ref 78.0–100.0)
PLATELETS: 471 10*3/uL — AB (ref 150–400)
RBC: 3.94 MIL/uL — AB (ref 4.22–5.81)
RDW: 20.5 % — ABNORMAL HIGH (ref 11.5–15.5)
WBC: 9.2 10*3/uL (ref 4.0–10.5)

## 2017-08-31 LAB — COMPREHENSIVE METABOLIC PANEL
ALK PHOS: 154 U/L — AB (ref 38–126)
ALT: 88 U/L — AB (ref 17–63)
ANION GAP: 7 (ref 5–15)
AST: 40 U/L (ref 15–41)
Albumin: 3.1 g/dL — ABNORMAL LOW (ref 3.5–5.0)
BUN: 11 mg/dL (ref 6–20)
CALCIUM: 9.3 mg/dL (ref 8.9–10.3)
CO2: 24 mmol/L (ref 22–32)
CREATININE: 0.84 mg/dL (ref 0.61–1.24)
Chloride: 109 mmol/L (ref 101–111)
Glucose, Bld: 86 mg/dL (ref 65–99)
Potassium: 4.4 mmol/L (ref 3.5–5.1)
SODIUM: 140 mmol/L (ref 135–145)
TOTAL PROTEIN: 6.6 g/dL (ref 6.5–8.1)
Total Bilirubin: 0.7 mg/dL (ref 0.3–1.2)

## 2017-08-31 LAB — BASIC METABOLIC PANEL
Anion gap: 8 (ref 5–15)
BUN: 12 mg/dL (ref 6–20)
CHLORIDE: 109 mmol/L (ref 101–111)
CO2: 24 mmol/L (ref 22–32)
CREATININE: 0.86 mg/dL (ref 0.61–1.24)
Calcium: 9.1 mg/dL (ref 8.9–10.3)
GFR calc Af Amer: >60 mL/min (ref >60)
GFR calc non Af Amer: >60 mL/min (ref >60)
GLUCOSE: 88 mg/dL (ref 65–99)
POTASSIUM: 3.9 mmol/L (ref 3.5–5.1)
SODIUM: 141 mmol/L (ref 135–145)

## 2017-08-31 MED ORDER — FENTANYL 40 MCG/ML IV SOLN
INTRAVENOUS | Status: DC
  Administered 2017-09-01: 9 ug via INTRAVENOUS
  Administered 2017-09-01: 25 ug via INTRAVENOUS
  Filled 2017-08-31: qty 25

## 2017-08-31 MED ORDER — LORAZEPAM 2 MG/ML IJ SOLN
1.0000 mg | Freq: Four times a day (QID) | INTRAMUSCULAR | Status: DC | PRN
  Administered 2017-08-31 (×2): 1 mg via INTRAVENOUS
  Filled 2017-08-31 (×2): qty 1

## 2017-08-31 MED ORDER — LORAZEPAM 2 MG/ML IJ SOLN: 1.0000 mg | Freq: Four times a day (QID) | INTRAMUSCULAR | Status: DC

## 2017-08-31 MED ORDER — FENTANYL 40 MCG/ML IV SOLN
INTRAVENOUS | Status: DC
  Administered 2017-08-31: 100 ug via INTRAVENOUS
  Filled 2017-08-31: qty 25

## 2017-08-31 NOTE — Progress Notes (Signed)
PHYSICAL THERAPY  Pt progressing well and amb with nursing staff and family.  Pt plans to D/C to home vs SNF.  Will cont to follow.  Will need to address stair goal.  [name]  PTA [facility]  Acute  Rehab Pager      [phone]

## 2017-08-31 NOTE — Progress Notes (Signed)
[facility]/Trauma Progress Note  31 Days Post-Op   Assessment/Plan Adrenal mass/hx of prior hemicolectomy/ileocolic anastomosis 4492 1. S/p laparoscopic converted to open right adrenalectomy, small bowel resection with anastomosis, placement of 20cm^2 wound vac, 05/06/17, Dr. Lurena Joiner [name] 2. Post op wound dehiscence - primary repair of small bowel anastomotic leak, abdominal closure of wound dehisence, 05/13/17, Luke [name] 3. S/p reopening of recent laparotomy, lysis of adhesions, small bowel resection, placement 200cm^2 negative pressure dressing, 05/14/17, Luke [name] 4. S/p reopening of recent laparotomy, creation of tube ileostomy, placement of 100cm2 negative pressure dressing, 05/17/17, Luke [name] 5. 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 Laparoscopy, laparotomy, extensive enterolysis with removal of mesh (partially) in the right upper quadrant; resection of old anastomosis and ileotransverse anastomosis, 07/31/17,Dr.Benedetta Sundstrom Hassell Done  - continue dressing changes - continue therapies - continuereglan 93m q8h for nausea and gut motility -  - remeron qhs to stimulate appetite  - restarted on Eraxis for oral thrush; Alk phos and LFT's trending down - palliative care assisting in pain management   FEN: Regular diet 10/19; TPN stopped 10/29 ID: Vancomycin 7/11- 05/25/17, Maixpime 7/17-7/31/18, Eraxis 7/19-05/27/17, Diflucan 9/5-9/14,&10/16-10/21/18; Cipro/ Flagyl pre-op 07/31/17; Eraxis 10/30>> DVT: Lovenox, SCDs  Plan:  Encouraged increased oral intake and hydration. Pt states he doesn't need the PCA anymore and he can take the percocet. He really wants to go home. Will discuss with Dr. MHassell Done    LOS: 117 days    Subjective:  CC: abdominal pain  Nausea and abdominal pain improving. Appetite getting better. Taking in more  orally. Pt is ready to go home. He states he can take the percocet and doesn't need the PCA anymore. Still having BM's and flatus. No vomiting. Wife at bedside  Objective: Vital signs in last 24 hours: Temp:  [97.9 F (36.6 C)-98.7 F (37.1 C)] 98.6 F (37 C) (11/05 1214) Pulse Rate:  [69-75] 75 (11/05 1214) Resp:  [16-18] 16 (11/05 1214) BP: (110-124)/(75-92) 124/92 (11/05 1214) SpO2:  [98 %-100 %] 100 % (11/05 1214) Last BM Date: 08/31/17  Intake/Output from previous day: 11/04 0701 - 11/05 0700 In: 120 [P.O.:120] Out: 225 [Urine:225] Intake/Output this shift: Total I/O In: 480 [P.O.:480] Out: -   PE: Gen: Alert, NAD, pleasant Card: Regular rate and rhythm Pulm: rate and effort normal Abd: Soft, not distended, +BS, midline wound well healing with retention sutures in place, would on right side of abdomen appears well healing with area of exposed mesh less necrotic looking   Anti-infectives: Anti-infectives (From admission, onward)   Start     Dose/Rate Route Frequency Ordered Stop   08/25/17 1200  anidulafungin (ERAXIS) 200 mg in sodium chloride 0.9 % 200 mL IVPB     200 mg 78 mL/hr over 200 Minutes Intravenous Every 24 hours 08/25/17 1047     08/11/17 1400  fluconazole (DIFLUCAN) IVPB 100 mg  Status:  Discontinued     100 mg 50 mL/hr over 60 Minutes Intravenous Every 24 hours 08/11/17 1312 08/16/17 1847   08/01/17 0600  metroNIDAZOLE (FLAGYL) IVPB 500 mg  Status:  Discontinued     500 mg 100 mL/hr over 60 Minutes Intravenous On call to O.R. 07/31/17 1656 07/31/17 1709   08/01/17 0600  ciprofloxacin (CIPRO) IVPB 400 mg  Status:  Discontinued     400 mg 200 mL/hr over 60 Minutes Intravenous On call to O.R. 07/31/17  1656 07/31/17 1709   07/31/17 0556  metroNIDAZOLE (FLAGYL) IVPB 500 mg     500 mg 100 mL/hr over 60 Minutes Intravenous On call to O.R. 07/31/17 0556 07/31/17 0840   07/31/17 0556  ciprofloxacin (CIPRO) IVPB 400 mg     400 mg 200 mL/hr over 60 Minutes  Intravenous On call to O.R. 07/31/17 0556 07/31/17 0914   07/08/17 1600  fluconazole (DIFLUCAN) IVPB 100 mg     100 mg 50 mL/hr over 60 Minutes Intravenous Every 24 hours 07/08/17 0828 07/14/17 1710   07/01/17 1500  fluconazole (DIFLUCAN) IVPB 100 mg     100 mg 50 mL/hr over 60 Minutes Intravenous Every 24 hours 07/01/17 1401 07/07/17 1715   06/16/17 1200  piperacillin-tazobactam (ZOSYN) IVPB 3.375 g     3.375 g 12.5 mL/hr over 240 Minutes Intravenous Every 8 hours 06/16/17 1026 06/17/17 0004   06/08/17 1200  piperacillin-tazobactam (ZOSYN) IVPB 3.375 g  Status:  Discontinued     3.375 g 12.5 mL/hr over 240 Minutes Intravenous Every 8 hours 06/08/17 1056 06/16/17 1026   05/29/17 2200  ceFEPIme (MAXIPIME) 2 g in dextrose 5 % 50 mL IVPB  Status:  Discontinued     2 g 100 mL/hr over 30 Minutes Intravenous Every 8 hours 05/29/17 2031 06/03/17 0832   05/29/17 2100  metroNIDAZOLE (FLAGYL) IVPB 500 mg  Status:  Discontinued     500 mg 100 mL/hr over 60 Minutes Intravenous Every 8 hours 05/29/17 2029 06/03/17 0832   05/26/17 2200  ceFAZolin (ANCEF) IVPB 1 g/50 mL premix  Status:  Discontinued     1 g 100 mL/hr over 30 Minutes Intravenous Every 8 hours 05/26/17 1008 05/29/17 2023   05/25/17 1100  vancomycin (VANCOCIN) 1,250 mg in sodium chloride 0.9 % 250 mL IVPB  Status:  Discontinued     1,250 mg 166.7 mL/hr over 90 Minutes Intravenous Every 12 hours 05/25/17 0955 05/26/17 0946   05/22/17 1400  anidulafungin (ERAXIS) 100 mg in sodium chloride 0.9 % 100 mL IVPB     100 mg 78 mL/hr over 100 Minutes Intravenous Every 24 hours 05/21/17 1330 05/27/17 1740   05/21/17 1400  anidulafungin (ERAXIS) 200 mg in sodium chloride 0.9 % 200 mL IVPB     200 mg 78 mL/hr over 200 Minutes Intravenous  Once 05/21/17 1330 05/21/17 1940   05/15/17 1000  anidulafungin (ERAXIS) 100 mg in sodium chloride 0.9 % 100 mL IVPB  Status:  Discontinued     100 mg 78 mL/hr over 100 Minutes Intravenous Every 24 hours 05/14/17  0829 05/16/17 0901   05/15/17 1000  metroNIDAZOLE (FLAGYL) IVPB 500 mg  Status:  Discontinued     500 mg 100 mL/hr over 60 Minutes Intravenous Every 8 hours 05/15/17 0903 05/25/17 0938   05/14/17 0900  anidulafungin (ERAXIS) 200 mg in sodium chloride 0.9 % 200 mL IVPB     200 mg 78 mL/hr over 200 Minutes Intravenous  Once 05/14/17 0829 05/14/17 1325   05/13/17 1927  vancomycin (VANCOCIN) 1-5 GM/200ML-% IVPB    Comments:  Ward, Christa   : cabinet override      05/13/17 1927 05/14/17 0744   05/12/17 1400  ceFEPIme (MAXIPIME) 1 g in dextrose 5 % 50 mL IVPB     1 g 100 mL/hr over 30 Minutes Intravenous Every 8 hours 05/12/17 0939 05/26/17 2359   05/12/17 0900  vancomycin (VANCOCIN) IVPB 1000 mg/200 mL premix  Status:  Discontinued     1,000 mg 200  mL/hr over 60 Minutes Intravenous Every 12 hours 05/12/17 0750 05/16/17 0852   05/12/17 0830  aztreonam (AZACTAM) 2 GM IVPB     2 g 100 mL/hr over 30 Minutes Intravenous  Once 05/12/17 0800 05/12/17 0957   05/06/17 0916  vancomycin (VANCOCIN) IVPB 1000 mg/200 mL premix     1,000 mg 200 mL/hr over 60 Minutes Intravenous On call to O.R. 05/06/17 0916 05/06/17 1229      Lab Results:  Recent Labs    08/29/17 0503 08/31/17 0536  WBC 9.6 9.2  HGB 10.5* 9.6*  HCT 33.7* 30.5*  PLT 496* 471*   BMET Recent Labs    08/31/17 0536 08/31/17 0738  NA 141 140  K 3.9 4.4  CL 109 109  CO2 24 24  GLUCOSE 88 86  BUN 12 11  CREATININE 0.86 0.84  CALCIUM 9.1 9.3   PT/INR No results for input(s): LABPROT, INR in the last 72 hours. CMP     Component Value Date/Time   NA 140 08/31/2017 0738   K 4.4 08/31/2017 0738   CL 109 08/31/2017 0738   CO2 24 08/31/2017 0738   GLUCOSE 86 08/31/2017 0738   BUN 11 08/31/2017 0738   CREATININE 0.84 08/31/2017 0738   CREATININE 0.85 07/22/2013 1021   CALCIUM 9.3 08/31/2017 0738   PROT 6.6 08/31/2017 0738   ALBUMIN 3.1 (L) 08/31/2017 0738   AST 40 08/31/2017 0738   ALT 88 (H) 08/31/2017 0738   ALKPHOS  154 (H) 08/31/2017 0738   BILITOT 0.7 08/31/2017 0738   GFRNONAA >60 08/31/2017 0738   GFRAA >60 08/31/2017 0738   Lipase     Component Value Date/Time   LIPASE 27 09/15/2015 0934    Studies/Results: No results found.    Kalman Drape , [facility] Surgery 08/31/2017, 1:21 PM Pager: [phone] Consults: [phone] Mon-Fri 7:00 am-4:30 pm Sat-Sun 7:00 am-11:30 am

## 2017-09-01 LAB — COMPREHENSIVE METABOLIC PANEL
ALK PHOS: 153 U/L — AB (ref 38–126)
ALT: 63 U/L (ref 17–63)
ANION GAP: 10 (ref 5–15)
AST: 31 U/L (ref 15–41)
Albumin: 2.9 g/dL — ABNORMAL LOW (ref 3.5–5.0)
BILIRUBIN TOTAL: 0.5 mg/dL (ref 0.3–1.2)
BUN: 9 mg/dL (ref 6–20)
CALCIUM: 9.3 mg/dL (ref 8.9–10.3)
CO2: 22 mmol/L (ref 22–32)
Chloride: 110 mmol/L (ref 101–111)
Creatinine, Ser: 0.86 mg/dL (ref 0.61–1.24)
GFR calc Af Amer: >60 mL/min (ref >60)
GLUCOSE: 82 mg/dL (ref 65–99)
Potassium: 3.9 mmol/L (ref 3.5–5.1)
Sodium: 142 mmol/L (ref 135–145)
TOTAL PROTEIN: 6.2 g/dL — AB (ref 6.5–8.1)

## 2017-09-01 LAB — CBC
HCT: 30.9 % — ABNORMAL LOW (ref 39.0–52.0)
Hemoglobin: 9.6 g/dL — ABNORMAL LOW (ref 13.0–17.0)
MCH: 24.2 pg — AB (ref 26.0–34.0)
MCHC: 31.1 g/dL (ref 30.0–36.0)
MCV: 77.8 fL — AB (ref 78.0–100.0)
PLATELETS: 433 10*3/uL — AB (ref 150–400)
RBC: 3.97 MIL/uL — AB (ref 4.22–5.81)
RDW: 20.5 % — AB (ref 11.5–15.5)
WBC: 11.2 10*3/uL — ABNORMAL HIGH (ref 4.0–10.5)

## 2017-09-01 MED ORDER — GABAPENTIN 300 MG PO CAPS
300.0000 mg | ORAL_CAPSULE | Freq: Every day | ORAL | Status: DC
  Administered 2017-09-01: 300 mg via ORAL
  Filled 2017-09-01 (×3): qty 1

## 2017-09-01 MED ORDER — LORAZEPAM 2 MG/ML IJ SOLN
1.0000 mg | Freq: Three times a day (TID) | INTRAMUSCULAR | Status: DC | PRN
  Administered 2017-09-01 – 2017-09-04 (×3): 2 mg via INTRAVENOUS
  Filled 2017-09-01 (×3): qty 1

## 2017-09-01 MED ORDER — FENTANYL CITRATE (PF) 100 MCG/2ML IJ SOLN
25.0000 ug | INTRAMUSCULAR | Status: DC | PRN
  Filled 2017-09-01: qty 2

## 2017-09-01 MED ORDER — OXYCODONE HCL 5 MG PO TABS: 5.0000 mg | ORAL_TABLET | ORAL | Status: DC | PRN

## 2017-09-01 MED ORDER — PSYLLIUM 95 % PO PACK
1.0000 | PACK | Freq: Two times a day (BID) | ORAL | Status: DC
  Administered 2017-09-01 – 2017-09-03 (×3): 1 via ORAL
  Filled 2017-09-01 (×5): qty 1

## 2017-09-01 MED ORDER — OXYCODONE HCL 5 MG PO TABS
10.0000 mg | ORAL_TABLET | Freq: Four times a day (QID) | ORAL | Status: DC | PRN
  Administered 2017-09-01 (×2): 15 mg via ORAL
  Administered 2017-09-01: 20 mg via ORAL
  Administered 2017-09-02: 15 mg via ORAL
  Administered 2017-09-03: 10 mg via ORAL
  Filled 2017-09-01: qty 4
  Filled 2017-09-01 (×2): qty 3
  Filled 2017-09-01: qty 2
  Filled 2017-09-01: qty 4

## 2017-09-01 MED ORDER — METOCLOPRAMIDE HCL 5 MG PO TABS
5.0000 mg | ORAL_TABLET | Freq: Three times a day (TID) | ORAL | Status: DC
  Administered 2017-09-01 – 2017-09-04 (×9): 5 mg via ORAL
  Filled 2017-09-01 (×8): qty 1

## 2017-09-01 MED ORDER — ACETAMINOPHEN 500 MG PO TABS
1000.0000 mg | ORAL_TABLET | Freq: Three times a day (TID) | ORAL | Status: DC
  Administered 2017-09-01 – 2017-09-03 (×8): 1000 mg via ORAL
  Filled 2017-09-01 (×9): qty 2

## 2017-09-01 NOTE — Consult Note (Signed)
   [facility] CM Inpatient Consult   09/01/2017  [name] [name] 08-21-1961 [id number]   Referral received for [place] Management services from inpatient RNCM due to extended hospital length of stay.   Spoke with [name] at bedside about potential [facility] Care Management services. He denies having any [facility] Care Management needs at this time.   Confirmed Primary Care MD is Dr. Dorthy Cooler. [facility] practices are listed as doing their own post hospital discharge calls.   Left voicemail message for Horris Latino with [facility] to request that [place] Management referral be made if [place] Management services are needed post discharge.  In the meantime, [facility] Care Management brochure and 24hr-nurse line magnet provided.    Marthenia Rolling, MSN-Ed, RN,BSN [facility] Liaison [phone]

## 2017-09-01 NOTE — Progress Notes (Signed)
Nutrition Follow-up  INTERVENTION:   -Continue Ensure Enlive BID, Boost Breeze BID, and Magic Cup once/day. -D/c Unjury Chicken soup per patient's dislike -Will continue to monitor plan  NUTRITION DIAGNOSIS:   Inadequate oral intake related to poor appetite as evidenced by per patient/family report, meal completion < 25%.  Improving.  GOAL:   Patient will meet greater than or equal to 90% of their needs  Progressing.  MONITOR:   PO intake, Supplement acceptance, Weight trends, Labs, Skin, I & O's  ASSESSMENT:   56 y.o. 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 On 7/18, he developed a leak therefore was taken back to OR for re-do of ex-lap and repair of leak.  7/11 KK:XFGHW adrenalectomy with complication of intestinal injury and small bowel resection with anastomosis  7/18 EX:HBZJIR of small bowel anastomotic leak, abdominal closure of wound dehisence 7/19 OR: reopening of recent laparotomy, lysis of adhesions, noted ischemic perforation of new loop of intestine, intact repair of previous anastomotic leak repair, intact previous anastomosis.  7/22 OR: wash out, tube ileostomy, left with open abdomen &wound vac in place. Plan for OR on 7/25 for closure 7/25 OR: mesh placed for abdominal closure, wound vac 8/2extubated, propofol drip off 8/4TPN off for ~ 5 hours due to line detached from filter. Ultrasound-guided aspiration By interventional radiology of small perihepatic fluid collection yielded only 1 mL of the fluid, sent for culture. NPO - mild coffee ground on NG but no active bleed 8/6patient improving. Off Precedex drip since 8/4. Dilaudid Drip being changed over to PCA. Still with some coffee ground-looking material in NG tube.  8/10 hyponatremia and plan to decrease TPN from  3L/day to 2L/day x1 week 8/22found to have fecal matter in wound vac (EC fistula) 8/14increase in fecal matter presence in wound vac with no plan for surgery at this time 8/16TPN advanced to goal rate of 115 ml/hr with 20% ILE @ 20 mL/hr x12 hours on MWF 8/22: NGT clamped. RD to order Boost Breeze for patient to sip on; Mycostatin started for oral thrush. 9/1:pulled NGT and no plans for replacement. 9/1:developed N/V and only permitted sips of clears, if tolerated. 9/4:NGT re-insertion attempted several times without success 9/7:begin cycling TPN 9/23: allowed to leave the floor to go outside 10/5 CV:[id number], laparotomy, extensive enterolysis with removal of mesh (partially) in the right upper quadrant; resection of old anastomosis and ileotransverse anastomosis 10/5:NGT placement during surgery 10/15:development of recurrent oral thrush 10/17:NGT removed at 10:45 AM 25/85:IDPOEUMP cyclic TPN volume from 3L/day to 2L/day 10/22-10/24:Calorie Count 10/29: TPN stopped and PICC removed   11/6: Patient eating better. Pt consuming 100% of meals. Family has been bringing in some meals for patient. Pt consumed 100% of breakfast this morning (provided ~600 kcal, 13g protein).  Pt reports not liking Unjury Chicken soup supplements, will d/c. Drinking Boost Breeze and Ensure supplements. Taking MVI. Pt eager to go home.  Labs reviewed. Medications: Pepcid tablet daily, Reglan tablet TID, Remeron SOL-TAB daily, Multivitamin with minerals daily, Iv Zofran every 8 hours, Phenergan tablet TID, Metamucil packet BID   10/31: Pt now off of TPN but has been eating poorly d/t persistent nausea. Pt states that he did not consume breakfast d/t nausea this AM. He denies emesis this AM but states he threw up clear emesis yesterday evening. Pt has scheduled Phenergan, scheduled Reglan,  and PRN Zofran ordered. He reports receiving antinausea medication yesterday after emesis but that this did  not help and that he was given antinausea medication this AM and has had no change/no improvement in nausea. He confirms that he is having anxiety and states that it is related to being in the hospital and not wanting to be here, but he denies anxiety causing or worsening nausea; will continue to monitor symptoms and any related changes in medications. Notes state pt with ongoing oral thrush; unable to determine if this is a factor limiting PO intake    Diet Order:  Diet regular Room service appropriate? Yes; Fluid consistency: Thin  EDUCATION NEEDS:   No education needs identified at this time  Skin:  Skin Assessment: Wound (see comment)(Incisions to R flank and abdomen from 7/11 and 7/18)  Last BM:  10/31  Height:   Ht Readings from Last 1 Encounters:  05/26/17 5\' 10"  (1.778 m)    Weight:   Wt Readings from Last 1 Encounters:  08/27/17 165 lb 9.1 oz (75.1 kg)    Ideal Body Weight:  75.45 kg  BMI:  Body mass index is 23.76 kg/m.  Estimated Nutritional Needs:   Kcal:  2030-2270 (25-28 kcal/kg)  Protein:  137-153 grams (1.7-1.9 grams/kg)  Fluid:  2 L/day  Clayton Bibles, MS, RD, LDN [place] Dietitian Pager: [phone] After Hours Pager: [phone]

## 2017-09-01 NOTE — Care Management Important Message (Signed)
Important Message  Patient Details  Name: [name] [name] MRN: [id number] Date of Birth: Nov 03, 1960   Medicare Important Message Given:  Yes    Kerin Salen 09/01/2017, 10:43 AMImportant Message  Patient Details  Name: [name] [name] MRN: [id number] Date of Birth: 18-Sep-1961   Medicare Important Message Given:  Yes    Kerin Salen 09/01/2017, 10:43 AM

## 2017-09-01 NOTE — Progress Notes (Signed)
[facility]/Trauma Progress Note  32 Days Post-Op   Assessment/Plan Adrenal mass/hx of prior hemicolectomy/ileocolic anastomosis 6720 1. S/p laparoscopic converted to open right adrenalectomy, small bowel resection with anastomosis, placement of 20cm^2 wound vac, 05/06/17, Dr. Lurena Joiner [name] 2. Post op wound dehiscence - primary repair of small bowel anastomotic leak, abdominal closure of wound dehisence, 05/13/17, Luke [name] 3. S/p reopening of recent laparotomy, lysis of adhesions, small bowel resection, placement 200cm^2 negative pressure dressing, 05/14/17, Luke [name] 4. S/p reopening of recent laparotomy, creation of tube ileostomy, placement of 100cm2 negative pressure dressing, 05/17/17, Luke [name] 5. 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 Laparoscopy, laparotomy, extensive enterolysis with removal of mesh (partially) in the right upper quadrant; resection of old anastomosis and ileotransverse anastomosis, 07/31/17,Dr.Matthew Hassell Done  - continue dressing changes - continue therapies - continuereglan 49m q8h for nausea and gut motility -  - remeron qhs to stimulate appetite  - restarted on Eraxis for oral thrush; Alk phos and LFT's trending down - palliative care assisting in pain management   FEN: Regular diet 10/19; TPN stopped 10/29 ID: Vancomycin 7/11- 05/25/17, Maixpime 7/17-7/31/18, Eraxis 7/19-05/27/17, Diflucan 9/5-9/14,&10/16-10/21/18; Cipro/ Flagyl pre-op 07/31/17; Eraxis 10/30>> DVT: Lovenox, SCDs  Plan: Encouraged increased oral intake and hydration. PCA dc'd. Hopefully home at the end of this week. Need palliative care to come see pt again and discuss outpt pain management    LOS: 118 days    Subjective: CC: abdominal pain  Pt is feeling okay and about to go for a walk. He has no concerns at this time.  Family at beside  Objective: Vital signs in last 24 hours: Temp:  [97.7 F (36.5 C)-98.9 F (37.2 C)] 98.9 F (37.2 C) (11/06 1400) Pulse Rate:  [67-94] 91 (11/06 1400) Resp:  [14-18] 17 (11/06 1400) BP: (106-124)/(72-83) 121/83 (11/06 1400) SpO2:  [92 %-100 %] 99 % (11/06 1400) Last BM Date: 08/31/17  Intake/Output from previous day: 11/05 0701 - 11/06 0700 In: 840 [P.O.:840] Out: -  Intake/Output this shift: Total I/O In: 120 [P.O.:120] Out: 0   PE: Gen: Alert, NAD, pleasant, appears well Pulm: rate and effort normal Neuro: alert and oriented   Anti-infectives: Anti-infectives (From admission, onward)   Start     Dose/Rate Route Frequency Ordered Stop   08/25/17 1200  anidulafungin (ERAXIS) 200 mg in sodium chloride 0.9 % 200 mL IVPB     200 mg 78 mL/hr over 200 Minutes Intravenous Every 24 hours 08/25/17 1047     08/11/17 1400  fluconazole (DIFLUCAN) IVPB 100 mg  Status:  Discontinued     100 mg 50 mL/hr over 60 Minutes Intravenous Every 24 hours 08/11/17 1312 08/16/17 1847   08/01/17 0600  metroNIDAZOLE (FLAGYL) IVPB 500 mg  Status:  Discontinued     500 mg 100 mL/hr over 60 Minutes Intravenous On call to O.R. 07/31/17 1656 07/31/17 1709   08/01/17 0600  ciprofloxacin (CIPRO) IVPB 400 mg  Status:  Discontinued     400 mg 200 mL/hr over 60 Minutes Intravenous On call to O.R. 07/31/17 1656 07/31/17 1709   07/31/17 0556  metroNIDAZOLE (FLAGYL) IVPB 500 mg     500 mg 100 mL/hr over 60 Minutes Intravenous On call to O.R. 07/31/17 0947010/05/18 0840   07/31/17 0556  ciprofloxacin (CIPRO) IVPB 400 mg     400 mg 200 mL/hr over 60 Minutes  Intravenous On call to O.R. 07/31/17 0556 07/31/17 0914   07/08/17 1600  fluconazole (DIFLUCAN) IVPB 100 mg     100 mg 50 mL/hr over 60 Minutes Intravenous Every 24 hours 07/08/17 0828 07/14/17 1710   07/01/17 1500  fluconazole (DIFLUCAN) IVPB 100 mg     100 mg 50 mL/hr over 60 Minutes Intravenous Every 24 hours 07/01/17 1401  07/07/17 1715   06/16/17 1200  piperacillin-tazobactam (ZOSYN) IVPB 3.375 g     3.375 g 12.5 mL/hr over 240 Minutes Intravenous Every 8 hours 06/16/17 1026 06/17/17 0004   06/08/17 1200  piperacillin-tazobactam (ZOSYN) IVPB 3.375 g  Status:  Discontinued     3.375 g 12.5 mL/hr over 240 Minutes Intravenous Every 8 hours 06/08/17 1056 06/16/17 1026   05/29/17 2200  ceFEPIme (MAXIPIME) 2 g in dextrose 5 % 50 mL IVPB  Status:  Discontinued     2 g 100 mL/hr over 30 Minutes Intravenous Every 8 hours 05/29/17 2031 06/03/17 0832   05/29/17 2100  metroNIDAZOLE (FLAGYL) IVPB 500 mg  Status:  Discontinued     500 mg 100 mL/hr over 60 Minutes Intravenous Every 8 hours 05/29/17 2029 06/03/17 0832   05/26/17 2200  ceFAZolin (ANCEF) IVPB 1 g/50 mL premix  Status:  Discontinued     1 g 100 mL/hr over 30 Minutes Intravenous Every 8 hours 05/26/17 1008 05/29/17 2023   05/25/17 1100  vancomycin (VANCOCIN) 1,250 mg in sodium chloride 0.9 % 250 mL IVPB  Status:  Discontinued     1,250 mg 166.7 mL/hr over 90 Minutes Intravenous Every 12 hours 05/25/17 0955 05/26/17 0946   05/22/17 1400  anidulafungin (ERAXIS) 100 mg in sodium chloride 0.9 % 100 mL IVPB     100 mg 78 mL/hr over 100 Minutes Intravenous Every 24 hours 05/21/17 1330 05/27/17 1740   05/21/17 1400  anidulafungin (ERAXIS) 200 mg in sodium chloride 0.9 % 200 mL IVPB     200 mg 78 mL/hr over 200 Minutes Intravenous  Once 05/21/17 1330 05/21/17 1940   05/15/17 1000  anidulafungin (ERAXIS) 100 mg in sodium chloride 0.9 % 100 mL IVPB  Status:  Discontinued     100 mg 78 mL/hr over 100 Minutes Intravenous Every 24 hours 05/14/17 0829 05/16/17 0901   05/15/17 1000  metroNIDAZOLE (FLAGYL) IVPB 500 mg  Status:  Discontinued     500 mg 100 mL/hr over 60 Minutes Intravenous Every 8 hours 05/15/17 0903 05/25/17 0938   05/14/17 0900  anidulafungin (ERAXIS) 200 mg in sodium chloride 0.9 % 200 mL IVPB     200 mg 78 mL/hr over 200 Minutes Intravenous  Once  05/14/17 0829 05/14/17 1325   05/13/17 1927  vancomycin (VANCOCIN) 1-5 GM/200ML-% IVPB    Comments:  Ward, Christa   : cabinet override      05/13/17 1927 05/14/17 0744   05/12/17 1400  ceFEPIme (MAXIPIME) 1 g in dextrose 5 % 50 mL IVPB     1 g 100 mL/hr over 30 Minutes Intravenous Every 8 hours 05/12/17 0939 05/26/17 2359   05/12/17 0900  vancomycin (VANCOCIN) IVPB 1000 mg/200 mL premix  Status:  Discontinued     1,000 mg 200 mL/hr over 60 Minutes Intravenous Every 12 hours 05/12/17 0750 05/16/17 0852   05/12/17 0830  aztreonam (AZACTAM) 2 GM IVPB     2 g 100 mL/hr over 30 Minutes Intravenous  Once 05/12/17 0800 05/12/17 0957   05/06/17 0916  vancomycin (VANCOCIN) IVPB 1000 mg/200 mL premix  1,000 mg 200 mL/hr over 60 Minutes Intravenous On call to O.R. 05/06/17 0916 05/06/17 1229      Lab Results:  Recent Labs    08/31/17 0536 09/01/17 0547  WBC 9.2 11.2*  HGB 9.6* 9.6*  HCT 30.5* 30.9*  PLT 471* 433*   BMET Recent Labs    08/31/17 0738 09/01/17 0547  NA 140 142  K 4.4 3.9  CL 109 110  CO2 24 22  GLUCOSE 86 82  BUN 11 9  CREATININE 0.84 0.86  CALCIUM 9.3 9.3   PT/INR No results for input(s): LABPROT, INR in the last 72 hours. CMP     Component Value Date/Time   NA 142 09/01/2017 0547   K 3.9 09/01/2017 0547   CL 110 09/01/2017 0547   CO2 22 09/01/2017 0547   GLUCOSE 82 09/01/2017 0547   BUN 9 09/01/2017 0547   CREATININE 0.86 09/01/2017 0547   CREATININE 0.85 07/22/2013 1021   CALCIUM 9.3 09/01/2017 0547   PROT 6.2 (L) 09/01/2017 0547   ALBUMIN 2.9 (L) 09/01/2017 0547   AST 31 09/01/2017 0547   ALT 63 09/01/2017 0547   ALKPHOS 153 (H) 09/01/2017 0547   BILITOT 0.5 09/01/2017 0547   GFRNONAA >60 09/01/2017 0547   GFRAA >60 09/01/2017 0547   Lipase     Component Value Date/Time   LIPASE 27 09/15/2015 0934    Studies/Results: No results found.    Kalman Drape , [facility] Surgery 09/01/2017, 4:01 PM Pager:  [phone] Consults: [phone] Mon-Fri 7:00 am-4:30 pm Sat-Sun 7:00 am-11:30 am

## 2017-09-01 NOTE — Progress Notes (Signed)
Spoke with patient at bedside. Discussed plans for d/c, patient states he was told d/c will likely be at the end of the week. He states his wife will be primary caregiver at home. He is ambulatory with a walker. Does not feel he needs any PT services but would like RN for wound care. Also requesting RW for home use. Asked that I confirm plans with his wife, [name] [name]. Called and left her a VM, will await callback. [phone]

## 2017-09-01 NOTE — Progress Notes (Signed)
Pharmacy Antibiotic Note  [name] [name] is a 56 y.o. male admitted on 05/06/2017 with severe esophageal and oropharyngeal canidiasis.  Pharmacy has been consulted for Eraxis dosing.  Plan:  Day 8 Eraxis 200 mg IV q24h   Using higher dose as per IDSA guidelines as there is concern that pt may have azole resistant candidiasis as pt has received fluconazole during stay. Per guidelines, recommended LOT for azole resistant esophageal candidiasis is 14-21 days.   Monitor LFTs as indicated depending on length of therapy   LFTs decreasing slowly,   Height: 5\' 10"  (177.8 cm) Weight: 165 lb 9.1 oz (75.1 kg) IBW/kg (Calculated) : 73  Temp (24hrs), Avg:98.6 F (37 C), Min:98.4 F (36.9 C), Max:98.7 F (37.1 C)  Recent Labs  Lab 08/27/17 0506 08/28/17 0533 08/29/17 0503 08/31/17 0536 08/31/17 0738 09/01/17 0547  WBC 11.3*  --  9.6 9.2  --  11.2*  CREATININE 0.82 0.83 0.97 0.86 0.84 0.86    Estimated Creatinine Clearance: 99 mL/min (by C-G formula based on SCr of 0.86 mg/dL).    Allergies  Allergen Reactions  . Chantix [Varenicline] Palpitations    Heart race Increased panic attacks  . Morphine Itching  . Penicillins Rash    Has patient had a PCN reaction causing immediate rash, facial/tongue/throat swelling, SOB or lightheadedness with hypotension: Yes Has patient had a PCN reaction causing severe rash involving mucus membranes or skin necrosis: Unknown Has patient had a PCN reaction that required hospitalization: No Has patient had a PCN reaction occurring within the last 10 years: No If all of the above answers are "NO", then may proceed with Cephalosporin use. Tolerating Zosyn 05/2017     Antimicrobials this admission: 7/17 Vancomcyin >> 7/21, resume 7/30 >> 7/31 7/17 Cefepime >> 7/31, resume 8/3 >> 8/8 7/19 Anidulafungin >> 7/21, resume 7/26 >> 8/1 7/20 metronidazole >> 7/30, resume 8/3 >> 8/8 7/31 Cefazolin >> 8/3 8/13 Zosyn >> 8/21 8/22 Nystatin for thrush>>9/4;  10/15>> 10/16 9/4 Diflucan (esophageal candidiasis) >> 9/18, 10/16 >> 10/21 10/30 anidulafungin >>    Dose adjustments this admission: ---  Microbiology results: 7/11 MRSA PCR: negative 7/18 Abdominal culture:  moderate Klebsiella pna - Pan sens except amp 7/19 BCx: NGF 7/22 Trach asp: multple orgs, none predominant 7/27 Trach asp: rare MSSA 7/27 BCx: 1/2 CoNS 8/4 perihepatic fluid: NGF 8/16 MRSA PCR: negative 10/29 Fungus culture ( throat ): yeast obeserved 11/1 urine Cx:  NGF   Thank you for allowing pharmacy to be a part of this patient's care.  Minda Ditto PharmD Pager [phone] 09/01/2017, 1:14 PM

## 2017-09-02 LAB — BASIC METABOLIC PANEL
Anion gap: 9 (ref 5–15)
BUN: 10 mg/dL (ref 6–20)
CALCIUM: 9.5 mg/dL (ref 8.9–10.3)
CHLORIDE: 112 mmol/L — AB (ref 101–111)
CO2: 22 mmol/L (ref 22–32)
CREATININE: 0.72 mg/dL (ref 0.61–1.24)
GFR calc non Af Amer: >60 mL/min (ref >60)
Glucose, Bld: 88 mg/dL (ref 65–99)
Potassium: 3.5 mmol/L (ref 3.5–5.1)
SODIUM: 143 mmol/L (ref 135–145)

## 2017-09-02 NOTE — Progress Notes (Addendum)
CSW consult- Spouse is requesting information for utility bill assistance program.  Patient gave CSW permission to talk with his spouse.  CSW called and left confidential voicemail for spouse. Waiting for return call.   Kathrin Greathouse, Latanya Presser, MSW Clinical Social Worker  [phone] 09/02/2017  12:28 PM

## 2017-09-02 NOTE — Progress Notes (Signed)
[facility]/Trauma Progress Note  33 Days Post-Op   Assessment/Plan Adrenal mass/hx of prior hemicolectomy/ileocolic anastomosis 9024 1. S/p laparoscopic converted to open right adrenalectomy, small bowel resection with anastomosis, placement of 20cm^2 wound vac, 05/06/17, Dr. Lurena Joiner [name] 2. Post op wound dehiscence - primary repair of small bowel anastomotic leak, abdominal closure of wound dehisence, 05/13/17, [name] [name] 3. S/p reopening of recent laparotomy, lysis of adhesions, small bowel resection, placement 200cm^2 negative pressure dressing, 05/14/17, [name] [name] 4. S/p reopening of recent laparotomy, creation of tube ileostomy, placement of 100cm2 negative pressure dressing, 05/17/17, [name] [name] 5. 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 Laparoscopy, laparotomy, extensive enterolysis with removal of mesh (partially) in the right upper quadrant; resection of old anastomosis and ileotransverse anastomosis, 07/31/17,Dr.Matthew Hassell [name]  - continue dressing changes - continue therapies - continuereglan 103m q8h for nausea and gut motility -  - remeron qhs to stimulate appetite  - restarted on Eraxis for oral thrush; Alk phos andLFT's trending down - palliative care assisting in pain management   FEN: Regular diet 10/19; TPN stopped 10/29 ID: Vancomycin 7/11- 05/25/17, Maixpime 7/17-7/31/18, Eraxis 7/19-05/27/17, Diflucan 9/5-9/14,&10/16-10/21/18; Cipro/ Flagyl pre-op 07/31/17; Eraxis 10/30>> DVT: Lovenox, SCDs  Plan: Encouraged increased oral intake and hydration. Hopefully home at the end of this week. Need palliative care to come see pt again and discuss outpt pain management. Will switch to a PO antifungal at discharge.      LOS: 119 days    Subjective:  CC; abdominal pain  Nausea and appetite improving. Pain well  controlled. Pt is anxious to go home. He has no new complaints today. He appears to be in good spirits. No family at bedside.  Objective: Vital signs in last 24 hours: Temp:  [97.7 F (36.5 C)-98.9 F (37.2 C)] 98 F (36.7 C) (11/07 0619) Pulse Rate:  [66-94] 66 (11/07 0619) Resp:  [17-18] 18 (11/07 0619) BP: (109-124)/(71-88) 109/71 (11/07 0619) SpO2:  [99 %-100 %] 100 % (11/07 0619) Weight:  [169 lb (76.7 kg)] 169 lb (76.7 kg) (11/07 0619) Last BM Date: 08/31/17  Intake/Output from previous day: 11/06 0701 - 11/07 0700 In: 360 [P.O.:360] Out: 0  Intake/Output this shift: No intake/output data recorded.  PE: Gen: Alert, NAD, pleasant Card: Regular rate and rhythm Pulm: rate and effort normal Abd: Soft, not distended, +BS, midline wound well healing with retention sutures in place, wound on right side of abdomen appears well healing with area of exposed mesh less necrotic looking           Anti-infectives: Anti-infectives (From admission, onward)   Start     Dose/Rate Route Frequency Ordered Stop   08/25/17 1200  anidulafungin (ERAXIS) 200 mg in sodium chloride 0.9 % 200 mL IVPB     200 mg 78 mL/hr over 200 Minutes Intravenous Every 24 hours 08/25/17 1047     08/11/17 1400  fluconazole (DIFLUCAN) IVPB 100 mg  Status:  Discontinued     100 mg 50 mL/hr over 60 Minutes Intravenous Every 24 hours 08/11/17 1312 08/16/17 1847   08/01/17 0600  metroNIDAZOLE (FLAGYL) IVPB 500 mg  Status:  Discontinued     500 mg 100 mL/hr over 60 Minutes Intravenous On call to O.R. 07/31/17 1656 07/31/17 1709   08/01/17 0600  ciprofloxacin (CIPRO) IVPB 400 mg  Status:  Discontinued     400 mg 200 mL/hr  over 60 Minutes Intravenous On call to O.R. 07/31/17 1656 07/31/17 1709   07/31/17 0556  metroNIDAZOLE (FLAGYL) IVPB 500 mg     500 mg 100 mL/hr over 60 Minutes Intravenous On call to O.R. 07/31/17 0556 07/31/17 0840   07/31/17 0556  ciprofloxacin (CIPRO) IVPB 400 mg     400 mg 200  mL/hr over 60 Minutes Intravenous On call to O.R. 07/31/17 0556 07/31/17 0914   07/08/17 1600  fluconazole (DIFLUCAN) IVPB 100 mg     100 mg 50 mL/hr over 60 Minutes Intravenous Every 24 hours 07/08/17 0828 07/14/17 1710   07/01/17 1500  fluconazole (DIFLUCAN) IVPB 100 mg     100 mg 50 mL/hr over 60 Minutes Intravenous Every 24 hours 07/01/17 1401 07/07/17 1715   06/16/17 1200  piperacillin-tazobactam (ZOSYN) IVPB 3.375 g     3.375 g 12.5 mL/hr over 240 Minutes Intravenous Every 8 hours 06/16/17 1026 06/17/17 0004   06/08/17 1200  piperacillin-tazobactam (ZOSYN) IVPB 3.375 g  Status:  Discontinued     3.375 g 12.5 mL/hr over 240 Minutes Intravenous Every 8 hours 06/08/17 1056 06/16/17 1026   05/29/17 2200  ceFEPIme (MAXIPIME) 2 g in dextrose 5 % 50 mL IVPB  Status:  Discontinued     2 g 100 mL/hr over 30 Minutes Intravenous Every 8 hours 05/29/17 2031 06/03/17 0832   05/29/17 2100  metroNIDAZOLE (FLAGYL) IVPB 500 mg  Status:  Discontinued     500 mg 100 mL/hr over 60 Minutes Intravenous Every 8 hours 05/29/17 2029 06/03/17 0832   05/26/17 2200  ceFAZolin (ANCEF) IVPB 1 g/50 mL premix  Status:  Discontinued     1 g 100 mL/hr over 30 Minutes Intravenous Every 8 hours 05/26/17 1008 05/29/17 2023   05/25/17 1100  vancomycin (VANCOCIN) 1,250 mg in sodium chloride 0.9 % 250 mL IVPB  Status:  Discontinued     1,250 mg 166.7 mL/hr over 90 Minutes Intravenous Every 12 hours 05/25/17 0955 05/26/17 0946   05/22/17 1400  anidulafungin (ERAXIS) 100 mg in sodium chloride 0.9 % 100 mL IVPB     100 mg 78 mL/hr over 100 Minutes Intravenous Every 24 hours 05/21/17 1330 05/27/17 1740   05/21/17 1400  anidulafungin (ERAXIS) 200 mg in sodium chloride 0.9 % 200 mL IVPB     200 mg 78 mL/hr over 200 Minutes Intravenous  Once 05/21/17 1330 05/21/17 1940   05/15/17 1000  anidulafungin (ERAXIS) 100 mg in sodium chloride 0.9 % 100 mL IVPB  Status:  Discontinued     100 mg 78 mL/hr over 100 Minutes Intravenous  Every 24 hours 05/14/17 0829 05/16/17 0901   05/15/17 1000  metroNIDAZOLE (FLAGYL) IVPB 500 mg  Status:  Discontinued     500 mg 100 mL/hr over 60 Minutes Intravenous Every 8 hours 05/15/17 0903 05/25/17 0938   05/14/17 0900  anidulafungin (ERAXIS) 200 mg in sodium chloride 0.9 % 200 mL IVPB     200 mg 78 mL/hr over 200 Minutes Intravenous  Once 05/14/17 0829 05/14/17 1325   05/13/17 1927  vancomycin (VANCOCIN) 1-5 GM/200ML-% IVPB    Comments:  Ward, Christa   : cabinet override      05/13/17 1927 05/14/17 0744   05/12/17 1400  ceFEPIme (MAXIPIME) 1 g in dextrose 5 % 50 mL IVPB     1 g 100 mL/hr over 30 Minutes Intravenous Every 8 hours 05/12/17 0939 05/26/17 2359   05/12/17 0900  vancomycin (VANCOCIN) IVPB 1000 mg/200 mL premix  Status:  Discontinued     1,000 mg 200 mL/hr over 60 Minutes Intravenous Every 12 hours 05/12/17 0750 05/16/17 0852   05/12/17 0830  aztreonam (AZACTAM) 2 GM IVPB     2 g 100 mL/hr over 30 Minutes Intravenous  Once 05/12/17 0800 05/12/17 0957   05/06/17 0916  vancomycin (VANCOCIN) IVPB 1000 mg/200 mL premix     1,000 mg 200 mL/hr over 60 Minutes Intravenous On call to O.R. 05/06/17 0916 05/06/17 1229      Lab Results:  Recent Labs    08/31/17 0536 09/01/17 0547  WBC 9.2 11.2*  HGB 9.6* 9.6*  HCT 30.5* 30.9*  PLT 471* 433*   BMET Recent Labs    09/01/17 0547 09/02/17 0514  NA 142 143  K 3.9 3.5  CL 110 112*  CO2 22 22  GLUCOSE 82 88  BUN 9 10  CREATININE 0.86 0.72  CALCIUM 9.3 9.5   PT/INR No results for input(s): LABPROT, INR in the last 72 hours. CMP     Component Value Date/Time   NA 143 09/02/2017 0514   K 3.5 09/02/2017 0514   CL 112 (H) 09/02/2017 0514   CO2 22 09/02/2017 0514   GLUCOSE 88 09/02/2017 0514   BUN 10 09/02/2017 0514   CREATININE 0.72 09/02/2017 0514   CREATININE 0.85 07/22/2013 1021   CALCIUM 9.5 09/02/2017 0514   PROT 6.2 (L) 09/01/2017 0547   ALBUMIN 2.9 (L) 09/01/2017 0547   AST 31 09/01/2017 0547   ALT  63 09/01/2017 0547   ALKPHOS 153 (H) 09/01/2017 0547   BILITOT 0.5 09/01/2017 0547   GFRNONAA >60 09/02/2017 0514   GFRAA >60 09/02/2017 0514   Lipase     Component Value Date/Time   LIPASE 27 09/15/2015 0934    Studies/Results: No results found.    Kalman Drape , [facility] 09/02/2017, 9:58 AM Pager: [phone] Consults: [phone] Mon-Fri 7:00 am-4:30 pm Sat-Sun 7:00 am-11:30 am

## 2017-09-02 NOTE — Progress Notes (Signed)
   09/02/17 1500  Clinical Encounter Type  Visited With Patient  Visit Type Follow-up  Spiritual Encounters  Spiritual Needs Prayer   Have continued to follow the patient and was glad to hear he might be going home this week.  We spent some time talking about what that transition will be like for him, having been in the hospital for so long.  He knows he will need to take it easy.  We prayed together.  Will follow as needed. Chaplain Katherene Ponto

## 2017-09-03 LAB — BASIC METABOLIC PANEL
ANION GAP: 8 (ref 5–15)
BUN: 10 mg/dL (ref 6–20)
CALCIUM: 9.4 mg/dL (ref 8.9–10.3)
CHLORIDE: 112 mmol/L — AB (ref 101–111)
CO2: 21 mmol/L — AB (ref 22–32)
Creatinine, Ser: 0.88 mg/dL (ref 0.61–1.24)
GFR calc non Af Amer: >60 mL/min (ref >60)
Glucose, Bld: 85 mg/dL (ref 65–99)
Potassium: 3.7 mmol/L (ref 3.5–5.1)
Sodium: 141 mmol/L (ref 135–145)

## 2017-09-03 NOTE — Progress Notes (Addendum)
[facility] Progress Note  34 Days Post-Op  Subjective: CC: abdominal pain Patient with pain well controlled. Anxious about going home, but wanting to get home. No new complaints. Nausea slightly improved, trying to eat some.  Already spoke with Dr. Hassell Done this AM.   Objective: Vital signs in last 24 hours: Temp:  [97.8 F (36.6 C)-98.3 F (36.8 C)] 97.9 F (36.6 C) (11/08 0519) Pulse Rate:  [71-86] 71 (11/08 0519) Resp:  [16-18] 16 (11/08 0519) BP: (96-138)/(71-83) 104/71 (11/08 0519) SpO2:  [99 %-100 %] 99 % (11/08 0519) Weight:  [74.8 kg (165 lb)] 74.8 kg (165 lb) (11/08 0700) Last BM Date: 09/02/17  Intake/Output from previous day: 11/07 0701 - 11/08 0700 In: 480 [P.O.:480] Out: 500 [Urine:500] Intake/Output this shift: Total I/O In: 240 [P.O.:240] Out: -   PE: Gen:  Alert, NAD, pleasant Pulm:  Normal effort Abd: Soft, non-distended, bowel sounds present in all 4 quadrants, no HSM Skin: warm and dry, no rashes  Psych: A&Ox3   Lab Results:  Recent Labs    09/01/17 0547  WBC 11.2*  HGB 9.6*  HCT 30.9*  PLT 433*   BMET Recent Labs    09/02/17 0514 09/03/17 0443  NA 143 141  K 3.5 3.7  CL 112* 112*  CO2 22 21*  GLUCOSE 88 85  BUN 10 10  CREATININE 0.72 0.88  CALCIUM 9.5 9.4   PT/INR No results for input(s): LABPROT, INR in the last 72 hours. CMP     Component Value Date/Time   NA 141 09/03/2017 0443   K 3.7 09/03/2017 0443   CL 112 (H) 09/03/2017 0443   CO2 21 (L) 09/03/2017 0443   GLUCOSE 85 09/03/2017 0443   BUN 10 09/03/2017 0443   CREATININE 0.88 09/03/2017 0443   CREATININE 0.85 07/22/2013 1021   CALCIUM 9.4 09/03/2017 0443   PROT 6.2 (L) 09/01/2017 0547   ALBUMIN 2.9 (L) 09/01/2017 0547   AST 31 09/01/2017 0547   ALT 63 09/01/2017 0547   ALKPHOS 153 (H) 09/01/2017 0547   BILITOT 0.5 09/01/2017 0547   GFRNONAA >60 09/03/2017 0443   GFRAA >60 09/03/2017 0443   Lipase     Component Value Date/Time   LIPASE 27  09/15/2015 0934       Studies/Results: No results found.  Anti-infectives: Anti-infectives (From admission, onward)   Start     Dose/Rate Route Frequency Ordered Stop   08/25/17 1200  anidulafungin (ERAXIS) 200 mg in sodium chloride 0.9 % 200 mL IVPB     200 mg 78 mL/hr over 200 Minutes Intravenous Every 24 hours 08/25/17 1047     08/11/17 1400  fluconazole (DIFLUCAN) IVPB 100 mg  Status:  Discontinued     100 mg 50 mL/hr over 60 Minutes Intravenous Every 24 hours 08/11/17 1312 08/16/17 1847   08/01/17 0600  metroNIDAZOLE (FLAGYL) IVPB 500 mg  Status:  Discontinued     500 mg 100 mL/hr over 60 Minutes Intravenous On call to O.R. 07/31/17 1656 07/31/17 1709   08/01/17 0600  ciprofloxacin (CIPRO) IVPB 400 mg  Status:  Discontinued     400 mg 200 mL/hr over 60 Minutes Intravenous On call to O.R. 07/31/17 1656 07/31/17 1709   07/31/17 0556  metroNIDAZOLE (FLAGYL) IVPB 500 mg     500 mg 100 mL/hr over 60 Minutes Intravenous On call to O.R. 07/31/17 6468 07/31/17 0840   07/31/17 0556  ciprofloxacin (CIPRO) IVPB 400 mg     400 mg 200 mL/hr over  60 Minutes Intravenous On call to O.R. 07/31/17 0556 07/31/17 0914   07/08/17 1600  fluconazole (DIFLUCAN) IVPB 100 mg     100 mg 50 mL/hr over 60 Minutes Intravenous Every 24 hours 07/08/17 0828 07/14/17 1710   07/01/17 1500  fluconazole (DIFLUCAN) IVPB 100 mg     100 mg 50 mL/hr over 60 Minutes Intravenous Every 24 hours 07/01/17 1401 07/07/17 1715   06/16/17 1200  piperacillin-tazobactam (ZOSYN) IVPB 3.375 g     3.375 g 12.5 mL/hr over 240 Minutes Intravenous Every 8 hours 06/16/17 1026 06/17/17 0004   06/08/17 1200  piperacillin-tazobactam (ZOSYN) IVPB 3.375 g  Status:  Discontinued     3.375 g 12.5 mL/hr over 240 Minutes Intravenous Every 8 hours 06/08/17 1056 06/16/17 1026   05/29/17 2200  ceFEPIme (MAXIPIME) 2 g in dextrose 5 % 50 mL IVPB  Status:  Discontinued     2 g 100 mL/hr over 30 Minutes Intravenous Every 8 hours 05/29/17  2031 06/03/17 0832   05/29/17 2100  metroNIDAZOLE (FLAGYL) IVPB 500 mg  Status:  Discontinued     500 mg 100 mL/hr over 60 Minutes Intravenous Every 8 hours 05/29/17 2029 06/03/17 0832   05/26/17 2200  ceFAZolin (ANCEF) IVPB 1 g/50 mL premix  Status:  Discontinued     1 g 100 mL/hr over 30 Minutes Intravenous Every 8 hours 05/26/17 1008 05/29/17 2023   05/25/17 1100  vancomycin (VANCOCIN) 1,250 mg in sodium chloride 0.9 % 250 mL IVPB  Status:  Discontinued     1,250 mg 166.7 mL/hr over 90 Minutes Intravenous Every 12 hours 05/25/17 0955 05/26/17 0946   05/22/17 1400  anidulafungin (ERAXIS) 100 mg in sodium chloride 0.9 % 100 mL IVPB     100 mg 78 mL/hr over 100 Minutes Intravenous Every 24 hours 05/21/17 1330 05/27/17 1740   05/21/17 1400  anidulafungin (ERAXIS) 200 mg in sodium chloride 0.9 % 200 mL IVPB     200 mg 78 mL/hr over 200 Minutes Intravenous  Once 05/21/17 1330 05/21/17 1940   05/15/17 1000  anidulafungin (ERAXIS) 100 mg in sodium chloride 0.9 % 100 mL IVPB  Status:  Discontinued     100 mg 78 mL/hr over 100 Minutes Intravenous Every 24 hours 05/14/17 0829 05/16/17 0901   05/15/17 1000  metroNIDAZOLE (FLAGYL) IVPB 500 mg  Status:  Discontinued     500 mg 100 mL/hr over 60 Minutes Intravenous Every 8 hours 05/15/17 0903 05/25/17 0938   05/14/17 0900  anidulafungin (ERAXIS) 200 mg in sodium chloride 0.9 % 200 mL IVPB     200 mg 78 mL/hr over 200 Minutes Intravenous  Once 05/14/17 0829 05/14/17 1325   05/13/17 1927  vancomycin (VANCOCIN) 1-5 GM/200ML-% IVPB    Comments:  Ward, Christa   : cabinet override      05/13/17 1927 05/14/17 0744   05/12/17 1400  ceFEPIme (MAXIPIME) 1 g in dextrose 5 % 50 mL IVPB     1 g 100 mL/hr over 30 Minutes Intravenous Every 8 hours 05/12/17 0939 05/26/17 2359   05/12/17 0900  vancomycin (VANCOCIN) IVPB 1000 mg/200 mL premix  Status:  Discontinued     1,000 mg 200 mL/hr over 60 Minutes Intravenous Every 12 hours 05/12/17 0750 05/16/17 0852    05/12/17 0830  aztreonam (AZACTAM) 2 GM IVPB     2 g 100 mL/hr over 30 Minutes Intravenous  Once 05/12/17 0800 05/12/17 0957   05/06/17 0916  vancomycin (VANCOCIN) IVPB 1000 mg/200 mL premix  1,000 mg 200 mL/hr over 60 Minutes Intravenous On call to O.R. 05/06/17 0916 05/06/17 1229       Assessment/Plan Adrenal mass/hx of prior hemicolectomy/ileocolic anastomosis 4174 1. S/p laparoscopic converted to open right adrenalectomy, small bowel resection with anastomosis, placement of 20cm^2 wound vac, 05/06/17, Dr. Lurena Joiner [name] 2. Post op wound dehiscence - primary repair of small bowel anastomotic leak, abdominal closure of wound dehisence, 05/13/17, Luke [name] 3. S/p reopening of recent laparotomy, lysis of adhesions, small bowel resection, placement 200cm^2 negative pressure dressing, 05/14/17, Luke [name] 4. S/p reopening of recent laparotomy, creation of tube ileostomy, placement of 100cm2 negative pressure dressing, 05/17/17, Luke [name] 5. 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 Laparoscopy, laparotomy, extensive enterolysis with removal of mesh (partially) in the right upper quadrant; resection of old anastomosis and ileotransverse anastomosis, 07/31/17,Dr.Jaisha Villacres Hassell Done  - retention sutures removed today continue dressing changes - continue therapies - continuereglan 42m q8h for nausea and gut motility  - remeron qhs to stimulate appetite  - restarted on Eraxis for oral thrush; Alk phos andLFT's trending down - palliative care assisting in pain management   FEN: Regular diet 10/19; TPN stopped 10/29 ID: Vancomycin 7/11- 05/25/17, Maixpime 7/17-7/31/18, Eraxis 7/19-05/27/17, Diflucan 9/5-9/14,&10/16-10/21/18; Cipro/ Flagyl pre-op 07/31/17; Eraxis 10/30>> DVT: Lovenox, SCDs  Plan: Encouraged increased oral intake and hydration.  Hopefully home tomorrow. Will touch base with palliative care to come see pt again and discuss outpt pain management. Will switch to a PO antifungal at discharge.     LOS: 120 days    KBrigid Re, PCarroll[facility] SpringsSurgery 09/03/2017, 1:03 PM Pager: [phone] Consults: [phone]-Fri 7:00 am-4:30 pm Sat-Sun 7:00 am-11:30 am  Retention sutures removed today.  Wounds looked good.  Novafils visible in the right subcostal incision  MKaylyn Lim MD, FACS

## 2017-09-03 NOTE — Progress Notes (Signed)
Spoke with patient and wife at bedside. They decline HH services, wife states she will be able to manage wound care at home, per nursing she has been providing some wound care while in the hospital. Patient did request a RW, ordered from [facility] to be delivered to the room. Plan for d/c to home in am. No further needs assessed. [phone]

## 2017-09-03 NOTE — Progress Notes (Signed)
Physical Therapy Treatment Patient Details Name: [name] [name] MRN: [id number] DOB: 29-Jul-1961 Today's Date: 09/03/2017    History of Present Illness  56 y.o. male admitted 7/11 with right adrenal mass , s/p  right adrenalectomy complicated by small bowel injury requiring resection with anastomosis . Has required multiple surgeris, the last being 10/5 for  Laparoscopy, laparotomy, extensive enterolysis with removal of mesh (partially) in the right upper quadrant; resection of old anastomosis and ileotransverse anastomosiss    PT Comments    Ambulated 100 feet with SPC with min guard. Required min guard with 5 steps in stairway with a SPC, with 25% VC's needed for sequencing. Positioned in recliner. Patient has met all PT goals plans to D/C to home tomorrow.   Follow Up Recommendations  Pt declining HH services      Equipment Recommendations  none    Recommendations for Other Services Rehab consult     Precautions / Restrictions Precautions Precautions: Fall Precaution Comments: abdominal wounds, left knee is buckling  a little Required Braces or Orthoses: Other Brace/Splint Other Brace/Splint: Restrictions Weight Bearing Restrictions: No    Mobility  Bed Mobility                  Transfers                    Ambulation/Gait Ambulation/Gait assistance: Min guard;Supervision Ambulation Distance (Feet): 100 Feet Assistive device: Straight cane Gait Pattern/deviations: Step-to pattern;Step-through pattern;Decreased step length - left;Decreased stride length Gait velocity: WFL Gait velocity interpretation: Below normal speed for age/gender General Gait Details: progressed to [place], 25% VC's for technique with SPC   Stairs Stairs: Yes   Stair Management: One rail Right;Step to pattern;With cane Number of Stairs: 5 General stair comments: min guard, 25% VC's for sequence  Wheelchair Mobility    Modified Rankin (Stroke Patients Only)       Balance                                             Cognition Arousal/Alertness: Awake/alert Behavior During Therapy: WFL for tasks assessed/performed Overall Cognitive Status: Within Functional Limits for tasks assessed                                        Exercises      General Comments        Pertinent Vitals/Pain Pain Assessment: 0-10 Pain Score: 2  Pain Location: left  medial knee, Pain Descriptors / Indicators: Grimacing Pain Intervention(s): Repositioned;Monitored during session    Home Living                      Prior Function            PT Goals (current goals can now be found in the care plan section)      Frequency    Min 3X/week      PT Plan Current plan remains appropriate    Co-evaluation              AM-PAC PT "6 Clicks" Daily Activity  Outcome Measure  Difficulty turning over in bed (including adjusting bedclothes, sheets and blankets)?: A Little Difficulty moving from lying on back to sitting on the side of the bed? : A Little  Difficulty sitting down on and standing up from a chair with arms (e.g., wheelchair, bedside commode, etc,.)?: A Little Help needed moving to and from a bed to chair (including a wheelchair)?: A Little Help needed walking in hospital room?: A Little Help needed climbing 3-5 steps with a railing? : A Little 6 Click Score: 18    End of Session Equipment Utilized During Treatment: Gait belt Activity Tolerance: Patient tolerated treatment well Patient left: in chair;with call bell/phone within reach Nurse Communication: Mobility status PT Visit Diagnosis: Difficulty in walking, not elsewhere classified (R26.2);Muscle weakness (generalized) (M62.81)     Time:  - 13:38 - 14:50    Charges:    1 gt                   G Codes:       Almond Lint, SPTA [facility] [place]  PTA [facility]  Acute  Rehab Pager      [phone]

## 2017-09-04 LAB — BASIC METABOLIC PANEL
Anion gap: 9 (ref 5–15)
BUN: 10 mg/dL (ref 6–20)
CHLORIDE: 111 mmol/L (ref 101–111)
CO2: 22 mmol/L (ref 22–32)
CREATININE: 0.82 mg/dL (ref 0.61–1.24)
Calcium: 9.4 mg/dL (ref 8.9–10.3)
GFR calc non Af Amer: >60 mL/min (ref >60)
Glucose, Bld: 81 mg/dL (ref 65–99)
POTASSIUM: 3.6 mmol/L (ref 3.5–5.1)
Sodium: 142 mmol/L (ref 135–145)

## 2017-09-04 MED ORDER — GABAPENTIN 300 MG PO CAPS: 300.0000 mg | ORAL_CAPSULE | Freq: Every day | ORAL | 0 refills | Status: DC

## 2017-09-04 MED ORDER — PROMETHAZINE HCL 12.5 MG PO TABS: 12.5000 mg | ORAL_TABLET | Freq: Three times a day (TID) | ORAL | 0 refills | Status: DC

## 2017-09-04 MED ORDER — ONDANSETRON 4 MG PO TBDP: 4.0000 mg | ORAL_TABLET | Freq: Three times a day (TID) | ORAL | 0 refills | Status: DC | PRN

## 2017-09-04 MED ORDER — MIRTAZAPINE 15 MG PO TBDP: 15.0000 mg | ORAL_TABLET | Freq: Every day | ORAL | 0 refills | Status: DC

## 2017-09-04 MED ORDER — DICLOFENAC SODIUM 1 % TD GEL: 4.0000 g | Freq: Four times a day (QID) | TRANSDERMAL | 0 refills | Status: DC

## 2017-09-04 MED ORDER — OXYCODONE-ACETAMINOPHEN 10-325 MG PO TABS: 1.0000 | ORAL_TABLET | Freq: Four times a day (QID) | ORAL | 0 refills | Status: DC | PRN

## 2017-09-04 MED ORDER — METOCLOPRAMIDE HCL 5 MG PO TABS: 5.0000 mg | ORAL_TABLET | Freq: Three times a day (TID) | ORAL | 0 refills | Status: DC

## 2017-09-04 MED ORDER — FAMOTIDINE 40 MG PO TABS: 40.0000 mg | ORAL_TABLET | Freq: Every day | ORAL | 1 refills | Status: DC

## 2017-09-04 NOTE — Progress Notes (Signed)
Pt checked frequently. Rested quietly at long intervals with eyes closed, resp even and unlabored. No c/o voiced. Wound care performed, wound beds are red, with granulation tissue noted. Tolerated procedure well.

## 2017-09-04 NOTE — Progress Notes (Signed)
Pt alert, oriented, a little anxious about going home.  D/C instructions and prescriptions were given.  All questions answered.  Pt was d/cd home.

## 2017-09-14 ENCOUNTER — Ambulatory Visit: Payer: Medicare Other | Admitting: Endocrinology

## 2017-09-25 LAB — FUNGAL ORGANISM REFLEX

## 2017-09-25 LAB — FUNGUS CULTURE WITH STAIN

## 2017-09-25 LAB — FUNGUS CULTURE RESULT

## 2017-10-03 ENCOUNTER — Telehealth: Payer: Self-pay | Admitting: Surgery

## 2017-10-03 NOTE — Telephone Encounter (Signed)
[name] [name]  [birth year] [id number]  Patient Care Team: Lujean Amel, MD as PCP - General (Family Medicine) Heath Lark, MD as Consulting Physician (Hematology and Oncology) Johnathan Hausen, MD as Consulting Physician (General Surgery) Frederik Pear, MD as Consulting Physician (Orthopedic Surgery) Erline Levine, MD as Consulting Physician (Neurosurgery)  This patient is a 56 y.o.male who calls today for surgical evaluation.    0.  Adrenal mass/hx of prior hemicolectomy/ileocolic anastomosis 3491 1. S/p laparoscopic converted to open right adrenalectomy, small bowel resection with anastomosis, placement of 20cm^2 wound vac, 05/06/17, Dr. Lurena Joiner [name] 2. Post op wound dehiscence - primary repair of small bowel anastomotic leak, abdominal closure of wound dehisence, 05/13/17, Luke [name] 3. S/p reopening of recent laparotomy, lysis of adhesions, small bowel resection, placement 200cm^2 negative pressure dressing, 05/14/17, Luke [name] 4. S/p reopening of recent laparotomy, creation of tube ileostomy, placement of 100cm2 negative pressure dressing, 05/17/17, Luke [name] 5. S/p abdominal wash out, placement of mesh for abdominal closure, placement of wound vac of 100cm^2, 05/20/17, Luke [name] 6. Laparoscopy, laparotomy, extensive enterolysis with removal of mesh (partially) in the right upper quadrant; resection of old anastomosis and ileotransverse anastomosis, 07/31/17,Dr.Matthew Hassell Done     Complex patient status post small bowel leak required for adrenalectomy complicated by repeated leaks and enterocutaneous fistula.  Ultimately recovered after lysis adhesions and re-anastomosis.  Has slowly healing wound with exposed old mesh slowly being debrided out.  Concern for mild drainage from wound.  I noted this will require gradual debridements to remove old permanent mesh and old stitches from surgery a decade ago.  Dr. Hassell Done has discussed this with myself and numerous partners  in the past.  Patient is wife insistent that he get his Remeron to help him sleep at night.  I noted I was not comfortable prescribing insomnia medications at this point.  I encouraged her to touch base with his primary care physician for anxiety and insomnia issues.  Apparently we had sent a prescription for Ativan anyway.  Patient wife seemed disappointed that I would not fill his sleeping medication.  I am skeptical that he would go on withdrawal.  She was worried he may be getting a panic attack.  Patient very familiar to [name].  I recommended she work through his primary care physician and probably psychiatry to help work through that  Patient Active Problem List   Diagnosis Date Noted  . 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    Priority: High  . Protein-calorie malnutrition, severe ([place]) 06/27/2017    Priority: High  . Pheochromocytoma, right, 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 06/27/2017    Priority: Medium  . Chronic narcotic use 05/10/2017    Priority: Medium  . PVC (premature ventricular contraction)   . Anxiety state 08/14/2017  . Hypomagnesemia 06/28/2017  . Hypothyroidism   . Pressure injury of skin 05/29/2017  . Ileus ([place])   . Hypokalemia 05/14/2017  . Anastomotic leak of intestine 05/14/2017  . Wound dehiscence, surgical 05/14/2017  . Aspiration pneumonia ([place]) 05/14/2017  . Generalized anxiety disorder 05/10/2017  . Intra-abdominal adhesions s/p SB resection 05/06/2017 05/09/2017  . Throat symptom 03/18/2016  . Multinodular goiter 01/19/2016  . Hyperthyroidism 01/19/2016  . Essential hypertension   . Diarrhea 11/30/2014  . Anemia, iron deficiency 11/01/2014  . Leukocytosis 11/03/2013  . Tobacco abuse 07/26/2013  . COPD (chronic obstructive pulmonary disease) ([place]) 07/26/2013  . Left knee pain 07/26/2013  . Pain in joint, shoulder region 05/03/2013  . GERD 07/24/2008  .  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    Past Medical  History:  Diagnosis Date  . Anemia   . Anxiety   . Arthritis    neck, shoulder, left knee  . 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 Erythrocytosis 11/30/2014  . Essential hypertension   . GERD (gastroesophageal reflux disease)   . Headache(784.0)    2 x/week  . History of kidney stones   . Hypothyroidism    hx of   . Leukocytosis, unspecified 11/03/2013  . Limited joint range of motion    cervical fusion 02/2013  . Medial meniscus tear 06/2013   left knee  . 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 Thyroid nodule   . Tobacco abuse   . Wears partial dentures    upper    Past Surgical History:  Procedure Laterality Date  . ANTERIOR CERVICAL DECOMP/DISCECTOMY FUSION N/A 03/18/2013   Procedure: ANTERIOR CERVICAL DECOMPRESSION/DISCECTOMY FUSION 3 LEVELS;  Surgeon: Erline Levine, MD;  Location: [facility];  Service: Neurosurgery;  Laterality: N/A;  Anterior Cervical Three-Six  Decompression/Diskectomy/Fusion  . APPENDECTOMY    . CIRCUMCISION  05/04/2006  . COLONOSCOPY    . EYE SURGERY     bilateral cataract surgery   . HEMICOLECTOMY Right 3/00/7622   with ileocolic anastomosis  . INGUINAL HERNIA REPAIR    . INSERTION OF MESH N/A 07/31/2017   Procedure: INSERTION OF MESH;  Surgeon: Johnathan Hausen, MD;  Location: [facility];  Service: General;  Laterality: N/A;  . KNEE ARTHROSCOPY Left 08/01/2013   Procedure: LEFT ARTHROSCOPY KNEE;  Surgeon: Kerin Salen, MD;  Location: [facility];  Service: Orthopedics;  Laterality: Left;  . LAPAROSCOPIC ADRENALECTOMY Right 05/06/2017   Procedure: OPEN RIGHT ADRENALECTOMY WITH SMALL BOWEL RESECTION AND ANASTOMOSIS;  Surgeon: Kieth Brightly, Arta Bruce, MD;  Location: [facility];  Service: General;  Laterality: Right;  . LAPAROSCOPIC LYSIS OF ADHESIONS N/A 07/31/2017   Procedure: LYSIS OF ADHESIONS;  Surgeon: Johnathan Hausen, MD;  Location: [facility];  Service: General;  Laterality: N/A;  . LAPAROSCOPY N/A 07/31/2017    Procedure: LAPAROSCOPY DIAGNOSTIC;  Surgeon: Johnathan Hausen, MD;  Location: [facility];  Service: General;  Laterality: N/A;  . LAPAROTOMY N/A 05/13/2017   Procedure: EXPLORATORY LAPAROTOMY, with wound dehisense, and repair of anastamotic leak;  Surgeon: Mickeal Skinner, MD;  Location: [facility];  Service: General;  Laterality: N/A;  . LAPAROTOMY N/A 05/14/2017   Procedure: EXPLORATORY LAPAROTOMY, LYSIS OF ADHESIONS/ SMALL BOWEL RESECTION/ WASHOUT OF ABDOMEN AND PLACEMENT OF NEGATIVE PRESSURE DRESSING;  Surgeon: Kieth Brightly, Arta Bruce, MD;  Location: [facility];  Service: General;  Laterality: N/A;  . LAPAROTOMY N/A 05/17/2017   Procedure: EXPLORATORY LAPAROTOMY, PLACEMENT OF ILEOSTOMY TUBES, PARTIAL CLOSURE OF FASCIA, WOUND VAC EXCHANGE;  Surgeon: [name], Arta Bruce, MD;  Location: [facility];  Service: General;  Laterality: N/A;  . LAPAROTOMY N/A 05/20/2017   Procedure: ABDOMINAL WOUND [place] OUT, WOUND CLOSURE AND WOUND VAC PLACEMENT;  Surgeon: [name], Arta Bruce, MD;  Location: [facility];  Service: General;  Laterality: N/A;  . LAPAROTOMY N/A 07/31/2017   Procedure: EXPLORATORY LAPAROTOMY WITH CLOSURE OF ENTEROTOMICS;  Surgeon: Johnathan Hausen, MD;  Location: [facility];  Service: General;  Laterality: N/A;  . SHOULDER ARTHROSCOPY W/ ROTATOR CUFF REPAIR Left 07/16/2010  . SMALL INTESTINE SURGERY  04/24/2000   adhesiolysis, ileocolic anastomosis  . UPPER GASTROINTESTINAL ENDOSCOPY      Social History   Socioeconomic History  . Marital status: Married  Spouse name: Not on file  . Number of children: Not on file  . Years of education: Not on file  . Highest education level: Not on file  Social Needs  . Financial resource strain: Not on file  . Food insecurity - worry: Not on file  . Food insecurity - inability: Not on file  . Transportation needs - medical: Not on file  . Transportation needs - non-medical: Not on file  Occupational History  . Not on file  Tobacco Use  . Smoking status: Current  Every Day Smoker    Packs/day: 1.00    Years: 25.00    Pack years: 25.00    Types: Cigarettes  . Smokeless tobacco: Never Used  Substance and Sexual Activity  . Alcohol use: Yes    Comment: occasionally  . Drug use: No  . Sexual activity: Not on file  Other Topics Concern  . Not on file  Social History Narrative   Lives in [place] with his wife.    Family History  Problem Relation Age of Onset  . Cancer Father 5       colon ca - deceased.  . Colon cancer Father   . Cancer Sister 30       breast ca  . Thyroid disease Neg Hx     Current Outpatient Medications  Medication Sig Dispense Refill  . atorvastatin (LIPITOR) 10 MG tablet Take 10 mg by mouth daily.    . chlorthalidone (HYGROTON) 25 MG tablet Take 12.5 mg by mouth daily.     . cloNIDine (CATAPRES) 0.1 MG tablet Take 0.1 mg by mouth 2 (two) times daily.    . diclofenac sodium (VOLTAREN) 1 % GEL Apply 4 g 4 (four) times daily topically. 1 Tube 0  . famotidine (PEPCID) 40 MG tablet Take 1 tablet (40 mg total) at bedtime by mouth. 30 tablet 1  . gabapentin (NEURONTIN) 300 MG capsule Take 1 capsule (300 mg total) at bedtime by mouth. 30 capsule 0  . ibuprofen (ADVIL,MOTRIN) 200 MG tablet Take 600 mg by mouth daily as needed for headache. Reported on 03/18/2016    . labetalol (NORMODYNE) 100 MG tablet Take 0.5 tablets (50 mg total) by mouth 2 (two) times daily. (Patient not taking: Reported on 04/28/2017) 30 tablet 5  . LORazepam (ATIVAN) 1 MG tablet Take 1 mg by mouth 2 (two) times daily as needed for anxiety.    . methocarbamol (ROBAXIN) 500 MG tablet Take 1 tablet (500 mg total) by mouth 2 (two) times daily. (Patient taking differently: Take 500 mg by mouth daily as needed for muscle spasms. ) 20 tablet 0  . metoCLOPramide (REGLAN) 5 MG tablet Take 1 tablet (5 mg total) 3 (three) times daily before meals by mouth. 90 tablet 0  . mirtazapine (REMERON SOL-TAB) 15 MG disintegrating tablet Take 1 tablet (15 mg total) at bedtime by  mouth. 30 tablet 0  . Multiple Vitamin (MULTIVITAMIN WITH MINERALS) TABS Take 1 tablet by mouth daily.    . ondansetron (ZOFRAN ODT) 4 MG disintegrating tablet Take 1 tablet (4 mg total) every 8 (eight) hours as needed by mouth for nausea or vomiting. 20 tablet 0  . oxyCODONE-acetaminophen (PERCOCET) 10-325 MG tablet Take 1 tablet every 6 (six) hours as needed by mouth for pain. 30 tablet 0  . Potassium Chloride ER 20 MEQ TBCR Take 20 mEq by mouth daily.    . promethazine (PHENERGAN) 12.5 MG tablet Take 1 tablet (12.5 mg total) 3 (three) times daily  before meals by mouth. 90 tablet 0  . sildenafil (VIAGRA) 100 MG tablet Take 100 mg by mouth daily as needed for erectile dysfunction.    . tadalafil (CIALIS) 20 MG tablet Take 20 mg by mouth daily as needed for erectile dysfunction.    . valsartan (DIOVAN) 80 MG tablet Take 80 mg by mouth daily.     No current facility-administered medications for this visit.      Allergies  Allergen Reactions  . Chantix [Varenicline] Palpitations    Heart race Increased panic attacks  . Morphine Itching  . Penicillins Rash    Has patient had a PCN reaction causing immediate rash, facial/tongue/throat swelling, SOB or lightheadedness with hypotension: Yes Has patient had a PCN reaction causing severe rash involving mucus membranes or skin necrosis: Unknown Has patient had a PCN reaction that required hospitalization: No Has patient had a PCN reaction occurring within the last 10 years: No If all of the above answers are "NO", then may proceed with Cephalosporin use. Tolerating Zosyn 05/2017     @VS @  No results found.  Note: This dictation was prepared with Dragon/digital dictation along with [employer]. Any transcriptional errors that result from this process are unintentional.   .Adin Hector, M.D., F.A.C.S. Gastrointestinal and Minimally Invasive Surgery [facility] [address] [place], [place]  [address] [phone] Main / Paging  10/03/2017 10:39 PM

## 2017-10-16 DIAGNOSIS — F410 Panic disorder [episodic paroxysmal anxiety] without agoraphobia: Secondary | ICD-10-CM | POA: Diagnosis not present

## 2017-10-16 DIAGNOSIS — F411 Generalized anxiety disorder: Secondary | ICD-10-CM | POA: Diagnosis not present

## 2017-10-16 DIAGNOSIS — T148XXA Other injury of unspecified body region, initial encounter: Secondary | ICD-10-CM | POA: Diagnosis not present

## 2017-10-16 DIAGNOSIS — D649 Anemia, unspecified: Secondary | ICD-10-CM | POA: Diagnosis not present

## 2017-10-16 DIAGNOSIS — R251 Tremor, unspecified: Secondary | ICD-10-CM | POA: Diagnosis not present

## 2017-11-03 DIAGNOSIS — Z23 Encounter for immunization: Secondary | ICD-10-CM | POA: Diagnosis not present

## 2017-11-06 DIAGNOSIS — Z79899 Other long term (current) drug therapy: Secondary | ICD-10-CM | POA: Diagnosis not present

## 2017-11-11 DIAGNOSIS — M4722 Other spondylosis with radiculopathy, cervical region: Secondary | ICD-10-CM | POA: Diagnosis not present

## 2017-11-11 DIAGNOSIS — M5416 Radiculopathy, lumbar region: Secondary | ICD-10-CM | POA: Diagnosis not present

## 2017-11-11 DIAGNOSIS — G8918 Other acute postprocedural pain: Secondary | ICD-10-CM | POA: Insufficient documentation

## 2017-11-16 DIAGNOSIS — E876 Hypokalemia: Secondary | ICD-10-CM | POA: Diagnosis not present

## 2017-11-19 DIAGNOSIS — I10 Essential (primary) hypertension: Secondary | ICD-10-CM | POA: Diagnosis not present

## 2017-11-19 DIAGNOSIS — F411 Generalized anxiety disorder: Secondary | ICD-10-CM | POA: Diagnosis not present

## 2017-11-19 DIAGNOSIS — G250 Essential tremor: Secondary | ICD-10-CM | POA: Diagnosis not present

## 2017-11-19 DIAGNOSIS — J069 Acute upper respiratory infection, unspecified: Secondary | ICD-10-CM | POA: Diagnosis not present

## 2017-12-14 DIAGNOSIS — F411 Generalized anxiety disorder: Secondary | ICD-10-CM | POA: Diagnosis not present

## 2017-12-14 DIAGNOSIS — F410 Panic disorder [episodic paroxysmal anxiety] without agoraphobia: Secondary | ICD-10-CM | POA: Diagnosis not present

## 2017-12-14 DIAGNOSIS — I10 Essential (primary) hypertension: Secondary | ICD-10-CM | POA: Diagnosis not present

## 2018-02-17 DIAGNOSIS — F411 Generalized anxiety disorder: Secondary | ICD-10-CM | POA: Diagnosis not present

## 2018-02-17 DIAGNOSIS — I10 Essential (primary) hypertension: Secondary | ICD-10-CM | POA: Diagnosis not present

## 2018-02-25 DIAGNOSIS — G8918 Other acute postprocedural pain: Secondary | ICD-10-CM | POA: Diagnosis not present

## 2018-02-25 DIAGNOSIS — M5416 Radiculopathy, lumbar region: Secondary | ICD-10-CM | POA: Diagnosis not present

## 2018-02-25 DIAGNOSIS — I10 Essential (primary) hypertension: Secondary | ICD-10-CM | POA: Diagnosis not present

## 2018-02-25 DIAGNOSIS — M4722 Other spondylosis with radiculopathy, cervical region: Secondary | ICD-10-CM | POA: Diagnosis not present

## 2018-05-27 DIAGNOSIS — Z79891 Long term (current) use of opiate analgesic: Secondary | ICD-10-CM | POA: Diagnosis not present

## 2018-05-27 DIAGNOSIS — M5416 Radiculopathy, lumbar region: Secondary | ICD-10-CM | POA: Diagnosis not present

## 2018-05-27 DIAGNOSIS — I10 Essential (primary) hypertension: Secondary | ICD-10-CM | POA: Diagnosis not present

## 2018-05-27 DIAGNOSIS — Z5181 Encounter for therapeutic drug level monitoring: Secondary | ICD-10-CM | POA: Diagnosis not present

## 2018-05-27 DIAGNOSIS — Z79899 Other long term (current) drug therapy: Secondary | ICD-10-CM | POA: Diagnosis not present

## 2018-05-27 DIAGNOSIS — M4722 Other spondylosis with radiculopathy, cervical region: Secondary | ICD-10-CM | POA: Diagnosis not present

## 2018-06-02 DIAGNOSIS — I10 Essential (primary) hypertension: Secondary | ICD-10-CM | POA: Diagnosis not present

## 2018-06-02 DIAGNOSIS — F411 Generalized anxiety disorder: Secondary | ICD-10-CM | POA: Diagnosis not present

## 2018-06-02 DIAGNOSIS — K644 Residual hemorrhoidal skin tags: Secondary | ICD-10-CM | POA: Diagnosis not present

## 2018-08-17 DIAGNOSIS — I10 Essential (primary) hypertension: Secondary | ICD-10-CM | POA: Diagnosis not present

## 2018-08-17 DIAGNOSIS — M5416 Radiculopathy, lumbar region: Secondary | ICD-10-CM | POA: Diagnosis not present

## 2018-08-17 DIAGNOSIS — M4722 Other spondylosis with radiculopathy, cervical region: Secondary | ICD-10-CM | POA: Diagnosis not present

## 2018-08-17 DIAGNOSIS — M791 Myalgia, unspecified site: Secondary | ICD-10-CM | POA: Diagnosis not present

## 2018-08-17 DIAGNOSIS — Z6825 Body mass index (BMI) 25.0-25.9, adult: Secondary | ICD-10-CM | POA: Diagnosis not present

## 2018-08-17 DIAGNOSIS — G8918 Other acute postprocedural pain: Secondary | ICD-10-CM | POA: Diagnosis not present

## 2018-09-07 DIAGNOSIS — M62830 Muscle spasm of back: Secondary | ICD-10-CM | POA: Diagnosis not present

## 2018-09-07 DIAGNOSIS — Z79899 Other long term (current) drug therapy: Secondary | ICD-10-CM | POA: Diagnosis not present

## 2018-09-07 DIAGNOSIS — Z23 Encounter for immunization: Secondary | ICD-10-CM | POA: Diagnosis not present

## 2018-09-07 DIAGNOSIS — I10 Essential (primary) hypertension: Secondary | ICD-10-CM | POA: Diagnosis not present

## 2018-09-07 DIAGNOSIS — F410 Panic disorder [episodic paroxysmal anxiety] without agoraphobia: Secondary | ICD-10-CM | POA: Diagnosis not present

## 2018-09-07 DIAGNOSIS — F411 Generalized anxiety disorder: Secondary | ICD-10-CM | POA: Diagnosis not present

## 2018-09-07 DIAGNOSIS — F5101 Primary insomnia: Secondary | ICD-10-CM | POA: Diagnosis not present

## 2018-11-02 DIAGNOSIS — I10 Essential (primary) hypertension: Secondary | ICD-10-CM | POA: Diagnosis not present

## 2018-11-02 DIAGNOSIS — M791 Myalgia, unspecified site: Secondary | ICD-10-CM | POA: Diagnosis not present

## 2018-11-02 DIAGNOSIS — G8918 Other acute postprocedural pain: Secondary | ICD-10-CM | POA: Diagnosis not present

## 2018-11-02 DIAGNOSIS — M5416 Radiculopathy, lumbar region: Secondary | ICD-10-CM | POA: Diagnosis not present

## 2018-11-02 DIAGNOSIS — Z6826 Body mass index (BMI) 26.0-26.9, adult: Secondary | ICD-10-CM | POA: Diagnosis not present

## 2018-11-08 ENCOUNTER — Other Ambulatory Visit: Payer: Self-pay

## 2018-11-08 ENCOUNTER — Emergency Department (HOSPITAL_COMMUNITY)
Admission: EM | Admit: 2018-11-08 | Discharge: 2018-11-08 | Disposition: A | Discharge status: Home / Self Care | Payer: Medicare HMO | Attending: Emergency Medicine | Admitting: Emergency Medicine

## 2018-11-08 ENCOUNTER — Emergency Department (HOSPITAL_COMMUNITY): Payer: Medicare HMO

## 2018-11-08 ENCOUNTER — Encounter (HOSPITAL_COMMUNITY): Payer: Self-pay | Admitting: Emergency Medicine

## 2018-11-08 DIAGNOSIS — I10 Essential (primary) hypertension: Secondary | ICD-10-CM | POA: Diagnosis not present

## 2018-11-08 DIAGNOSIS — Z79899 Other long term (current) drug therapy: Secondary | ICD-10-CM | POA: Insufficient documentation

## 2018-11-08 DIAGNOSIS — N134 Hydroureter: Secondary | ICD-10-CM | POA: Diagnosis not present

## 2018-11-08 DIAGNOSIS — R1032 Left lower quadrant pain: Secondary | ICD-10-CM | POA: Diagnosis present

## 2018-11-08 DIAGNOSIS — N200 Calculus of kidney: Secondary | ICD-10-CM | POA: Diagnosis not present

## 2018-11-08 DIAGNOSIS — E039 Hypothyroidism, unspecified: Secondary | ICD-10-CM | POA: Insufficient documentation

## 2018-11-08 DIAGNOSIS — N130 Hydronephrosis with ureteropelvic junction obstruction: Secondary | ICD-10-CM | POA: Diagnosis not present

## 2018-11-08 DIAGNOSIS — N201 Calculus of ureter: Secondary | ICD-10-CM | POA: Insufficient documentation

## 2018-11-08 DIAGNOSIS — F17210 Nicotine dependence, cigarettes, uncomplicated: Secondary | ICD-10-CM | POA: Diagnosis not present

## 2018-11-08 LAB — COMPREHENSIVE METABOLIC PANEL
ALK PHOS: 67 U/L (ref 38–126)
ALT: 22 U/L (ref 0–44)
AST: 23 U/L (ref 15–41)
Albumin: 3.9 g/dL (ref 3.5–5.0)
Anion gap: 7 (ref 5–15)
BILIRUBIN TOTAL: 0.6 mg/dL (ref 0.3–1.2)
BUN: 15 mg/dL (ref 6–20)
CALCIUM: 9.4 mg/dL (ref 8.9–10.3)
CO2: 24 mmol/L (ref 22–32)
Chloride: 111 mmol/L (ref 98–111)
Creatinine, Ser: 1.58 mg/dL — ABNORMAL HIGH (ref 0.61–1.24)
GFR calc Af Amer: 55 mL/min — ABNORMAL LOW (ref >60)
GFR calc non Af Amer: 48 mL/min — ABNORMAL LOW (ref >60)
GLUCOSE: 104 mg/dL — AB (ref 70–99)
Potassium: 3.9 mmol/L (ref 3.5–5.1)
Sodium: 142 mmol/L (ref 135–145)
TOTAL PROTEIN: 7.1 g/dL (ref 6.5–8.1)

## 2018-11-08 LAB — URINALYSIS, ROUTINE W REFLEX MICROSCOPIC
Bilirubin Urine: NEGATIVE
GLUCOSE, UA: NEGATIVE mg/dL
Ketones, ur: NEGATIVE mg/dL
Leukocytes, UA: NEGATIVE
Nitrite: NEGATIVE
Protein, ur: NEGATIVE mg/dL
Specific Gravity, Urine: 1.023 (ref 1.005–1.030)
pH: 5 (ref 5.0–8.0)

## 2018-11-08 LAB — CBC
HCT: 39.6 % (ref 39.0–52.0)
HEMOGLOBIN: 12.3 g/dL — AB (ref 13.0–17.0)
MCH: 27.6 pg (ref 26.0–34.0)
MCHC: 31.1 g/dL (ref 30.0–36.0)
MCV: 89 fL (ref 80.0–100.0)
PLATELETS: 296 10*3/uL (ref 150–400)
RBC: 4.45 MIL/uL (ref 4.22–5.81)
RDW: 14.6 % (ref 11.5–15.5)
WBC: 13.4 10*3/uL — ABNORMAL HIGH (ref 4.0–10.5)
nRBC: 0 % (ref 0.0–0.2)

## 2018-11-08 LAB — LIPASE, BLOOD: Lipase: 29 U/L (ref 11–51)

## 2018-11-08 MED ORDER — ONDANSETRON 4 MG PO TBDP: 4.0000 mg | ORAL_TABLET | Freq: Three times a day (TID) | ORAL | 0 refills | Status: DC | PRN

## 2018-11-08 MED ORDER — TAMSULOSIN HCL 0.4 MG PO CAPS: 0.4000 mg | ORAL_CAPSULE | Freq: Every day | ORAL | 0 refills | Status: DC

## 2018-11-08 MED ORDER — IOHEXOL 300 MG/ML  SOLN
100.0000 mL | Freq: Once | INTRAMUSCULAR | Status: AC | PRN
  Administered 2018-11-08: 100 mL via INTRAVENOUS

## 2018-11-08 MED ORDER — OXYCODONE-ACETAMINOPHEN 5-325 MG PO TABS: 1.0000 | ORAL_TABLET | ORAL | 0 refills | Status: DC | PRN

## 2018-11-08 MED ORDER — HYDROMORPHONE HCL 2 MG PO TABS: 2.0000 mg | ORAL_TABLET | ORAL | 0 refills | Status: DC | PRN

## 2018-11-08 MED ORDER — ONDANSETRON HCL 4 MG/2ML IJ SOLN
4.0000 mg | Freq: Once | INTRAMUSCULAR | Status: AC
  Administered 2018-11-08: 4 mg via INTRAVENOUS
  Filled 2018-11-08: qty 2

## 2018-11-08 MED ORDER — FENTANYL CITRATE (PF) 100 MCG/2ML IJ SOLN
100.0000 ug | Freq: Once | INTRAMUSCULAR | Status: AC
  Administered 2018-11-08: 100 ug via INTRAVENOUS
  Filled 2018-11-08: qty 2

## 2018-11-08 MED ORDER — HYDROMORPHONE HCL 1 MG/ML IJ SOLN
1.0000 mg | Freq: Once | INTRAMUSCULAR | Status: AC
  Administered 2018-11-08: 1 mg via INTRAVENOUS
  Filled 2018-11-08: qty 1

## 2018-11-08 MED ORDER — KETOROLAC TROMETHAMINE 30 MG/ML IJ SOLN
30.0000 mg | Freq: Once | INTRAMUSCULAR | Status: AC
  Administered 2018-11-08: 30 mg via INTRAVENOUS
  Filled 2018-11-08: qty 1

## 2018-11-08 NOTE — ED Notes (Signed)
ED Provider at bedside. 

## 2018-11-08 NOTE — Discharge Instructions (Signed)
Call your pain management MD tomorrow to let them know about pain medications

## 2018-11-08 NOTE — ED Notes (Signed)
Patient up to bathroom, no assist

## 2018-11-08 NOTE — ED Triage Notes (Signed)
Pt reports pain to LLQ radiating to R flank starting this morning. Pt reports nausea, denies vomiting. States he took 25 mg of phenergan with relief but reports he is starting to feel nauseous again. Pt reports decreased urination, denies painful urination or hematuria.

## 2018-11-08 NOTE — ED Notes (Signed)
Pt's sister has been standing outside room asking for MD; pt has not complained at this time; waiting for re evaluation by PA; PA has been notified of family request-Monique,RN

## 2018-11-08 NOTE — ED Provider Notes (Signed)
[facility] EMERGENCY DEPARTMENT Provider Note   CSN: [id number] Arrival date & time: 11/08/18  1457     History   Chief Complaint Chief Complaint  Patient presents with  . Abdominal Pain    HPI [name] [name] is a 58 y.o. male.  The history is provided by the patient. No language interpreter was used.  Flank Pain  This is a new problem. The problem occurs constantly. The problem has been gradually worsening. Associated symptoms include abdominal pain. Nothing relieves the symptoms. He has tried nothing for the symptoms.   Pt complains of severe left back pain and left lowr abdominal pain.  Pt has a history of multiple abdominal surgeries  Past Medical History:  Diagnosis Date  . Anemia   . Anxiety   . Arthritis    neck, shoulder, left knee  . 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 Erythrocytosis 11/30/2014  . Essential hypertension   . GERD (gastroesophageal reflux disease)   . Headache(784.0)    2 x/week  . History of kidney stones   . Hypothyroidism    hx of   . Leukocytosis, unspecified 11/03/2013  . Limited joint range of motion    cervical fusion 02/2013  . Medial meniscus tear 06/2013   left knee  . 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 Thyroid nodule   . Tobacco abuse   . Wears partial dentures    upper    Patient Active Problem List   Diagnosis Date Noted  . PVC (premature ventricular contraction)   . Anxiety state 08/14/2017  . Hypomagnesemia 06/28/2017  . Hypothyroidism   . 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 Protein-calorie malnutrition, severe ([place]) 06/27/2017  . 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 Pressure injury of skin 05/29/2017  . Ileus ([place])   . Hypokalemia 05/14/2017  . Anastomotic leak of intestine 05/14/2017  . Wound dehiscence, surgical 05/14/2017  . Aspiration pneumonia ([place]) 05/14/2017  . Chronic  narcotic use 05/10/2017  . Generalized anxiety disorder 05/10/2017  . Intra-abdominal adhesions s/p SB resection 05/06/2017 05/09/2017  . Throat symptom 03/18/2016  . Multinodular goiter 01/19/2016  . Hyperthyroidism 01/19/2016  . Essential hypertension   . Diarrhea 11/30/2014  . Anemia, iron deficiency 11/01/2014  . Leukocytosis 11/03/2013  . Tobacco abuse 07/26/2013  . COPD (chronic obstructive pulmonary disease) ([place]) 07/26/2013  . Left knee pain 07/26/2013  . Pain in joint, shoulder region 05/03/2013  . GERD 07/24/2008  . 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 ABDOMINAL SURGERY    . ANTERIOR CERVICAL DECOMP/DISCECTOMY FUSION N/A 03/18/2013   Procedure: ANTERIOR CERVICAL DECOMPRESSION/DISCECTOMY FUSION 3 LEVELS;  Surgeon: Erline Levine, MD;  Location: [facility];  Service: Neurosurgery;  Laterality: N/A;  Anterior Cervical Three-Six  Decompression/Diskectomy/Fusion  . APPENDECTOMY    . CIRCUMCISION  05/04/2006  . COLONOSCOPY    . EYE SURGERY     bilateral cataract surgery   . HEMICOLECTOMY Right 6/71/2458   with ileocolic anastomosis  . INGUINAL HERNIA REPAIR    . INSERTION OF MESH N/A 07/31/2017   Procedure: INSERTION OF MESH;  Surgeon: Johnathan Hausen, MD;  Location: [facility];  Service: General;  Laterality: N/A;  . KNEE ARTHROSCOPY Left 08/01/2013   Procedure: LEFT ARTHROSCOPY KNEE;  Surgeon: Kerin Salen, MD;  Location: [facility];  Service: Orthopedics;  Laterality: Left;  . LAPAROSCOPIC  ADRENALECTOMY Right 05/06/2017   Procedure: OPEN RIGHT ADRENALECTOMY WITH SMALL BOWEL RESECTION AND ANASTOMOSIS;  Surgeon: Kieth Brightly, Arta Bruce, MD;  Location: [facility];  Service: General;  Laterality: Right;  . LAPAROSCOPIC LYSIS OF ADHESIONS N/A 07/31/2017   Procedure: LYSIS OF ADHESIONS;  Surgeon: Johnathan Hausen, MD;  Location: [facility];  Service: General;  Laterality: N/A;  . LAPAROSCOPY N/A 07/31/2017   Procedure:  LAPAROSCOPY DIAGNOSTIC;  Surgeon: Johnathan Hausen, MD;  Location: [facility];  Service: General;  Laterality: N/A;  . LAPAROTOMY N/A 05/13/2017   Procedure: EXPLORATORY LAPAROTOMY, with wound dehisense, and repair of anastamotic leak;  Surgeon: Mickeal Skinner, MD;  Location: [facility];  Service: General;  Laterality: N/A;  . LAPAROTOMY N/A 05/14/2017   Procedure: EXPLORATORY LAPAROTOMY, LYSIS OF ADHESIONS/ SMALL BOWEL RESECTION/ WASHOUT OF ABDOMEN AND PLACEMENT OF NEGATIVE PRESSURE DRESSING;  Surgeon: Kieth Brightly, Arta Bruce, MD;  Location: [facility];  Service: General;  Laterality: N/A;  . LAPAROTOMY N/A 05/17/2017   Procedure: EXPLORATORY LAPAROTOMY, PLACEMENT OF ILEOSTOMY TUBES, PARTIAL CLOSURE OF FASCIA, WOUND VAC EXCHANGE;  Surgeon: [name], Arta Bruce, MD;  Location: [facility];  Service: General;  Laterality: N/A;  . LAPAROTOMY N/A 05/20/2017   Procedure: ABDOMINAL WOUND [place] OUT, WOUND CLOSURE AND WOUND VAC PLACEMENT;  Surgeon: [name], Arta Bruce, MD;  Location: [facility];  Service: General;  Laterality: N/A;  . LAPAROTOMY N/A 07/31/2017   Procedure: EXPLORATORY LAPAROTOMY WITH CLOSURE OF ENTEROTOMICS;  Surgeon: Johnathan Hausen, MD;  Location: [facility];  Service: General;  Laterality: N/A;  . SHOULDER ARTHROSCOPY W/ ROTATOR CUFF REPAIR Left 07/16/2010  . SMALL INTESTINE SURGERY  04/24/2000   adhesiolysis, ileocolic anastomosis  . UPPER GASTROINTESTINAL ENDOSCOPY          Home Medications    Prior to Admission medications   Medication Sig Start Date End Date Taking? Authorizing Provider  acetaminophen (TYLENOL) 500 MG tablet Take 1,000 mg by mouth every 6 (six) hours as needed for mild pain.   Yes [provider]  amLODipine (NORVASC) 5 MG tablet Take 5 mg by mouth daily.   Yes [provider]  diclofenac sodium (VOLTAREN) 1 % GEL Apply 4 g 4 (four) times daily topically. Patient taking differently: Apply 4 g topically 4 (four) times daily as needed (pain).  09/04/17  Yes Focht,  Jessica L, PA  famotidine (PEPCID) 40 MG tablet Take 1 tablet (40 mg total) at bedtime by mouth. 09/04/17  Yes Focht, Jessica L, PA  ibuprofen (ADVIL,MOTRIN) 200 MG tablet Take 600 mg by mouth daily as needed for headache. Reported on 03/18/2016   Yes [provider]  LORazepam (ATIVAN) 1 MG tablet Take 1 mg by mouth 2 (two) times daily as needed for anxiety.   Yes [provider]  methocarbamol (ROBAXIN) 500 MG tablet Take 1 tablet (500 mg total) by mouth 2 (two) times daily. Patient taking differently: Take 500 mg by mouth daily as needed for muscle spasms.  10/05/15  Yes Larene Pickett, PA-C  Multiple Vitamin (MULTIVITAMIN WITH MINERALS) TABS Take 1 tablet by mouth daily.   Yes [provider]  omeprazole (PRILOSEC) 40 MG capsule Take 40 mg by mouth daily.   Yes [provider]  Potassium Chloride ER 20 MEQ TBCR Take 20 mEq by mouth daily. 02/18/17  Yes [provider]  promethazine (PHENERGAN) 12.5 MG tablet Take 1 tablet (12.5 mg total) 3 (three) times daily before meals by mouth. 09/04/17  Yes Focht, Jessica L, PA  traZODone (DESYREL) 100 MG  tablet Take 100 mg by mouth at bedtime.   Yes [provider]  valsartan (DIOVAN) 80 MG tablet Take 80 mg by mouth daily. 03/03/17  Yes [provider]  ondansetron (ZOFRAN ODT) 4 MG disintegrating tablet Take 1 tablet (4 mg total) by mouth every 8 (eight) hours as needed for nausea or vomiting. 11/08/18   Fransico Meadow, PA-C  oxyCODONE-acetaminophen (PERCOCET/ROXICET) 5-325 MG tablet Take 1 tablet by mouth every 4 (four) hours as needed for severe pain. 11/08/18   Fransico Meadow, PA-C  sildenafil (VIAGRA) 100 MG tablet Take 100 mg by mouth daily as needed for erectile dysfunction.    [provider]  tadalafil (CIALIS) 20 MG tablet Take 20 mg by mouth daily as needed for erectile dysfunction.    [provider]  tamsulosin (FLOMAX) 0.4 MG CAPS capsule Take 1 capsule (0.4 mg total)  by mouth daily. 11/08/18   Fransico Meadow, PA-C  simethicone (MYLICON) 80 MG chewable tablet Chew 80 mg by mouth every 6 (six) hours as needed for flatulence.  02/01/14  [provider]    Family History Family History  Problem Relation Age of Onset  . Cancer Father 88       colon ca - deceased.  . Colon cancer Father   . Cancer Sister 15       breast ca  . Thyroid disease Neg Hx     Social History Social History   Tobacco Use  . Smoking status: Current Every Day Smoker    Packs/day: 1.00    Years: 25.00    Pack years: 25.00    Types: Cigarettes  . Smokeless tobacco: Never Used  Substance Use Topics  . Alcohol use: Yes    Comment: occasionally  . Drug use: No     Allergies   Chantix [varenicline]; Morphine; and Penicillins   Review of Systems Review of Systems  Gastrointestinal: Positive for abdominal pain.  Genitourinary: Positive for flank pain.  All other systems reviewed and are negative.    Physical Exam Updated Vital Signs BP 114/82 (BP Location: Right Arm)   Pulse 60   Temp 97.9 F (36.6 C) (Oral)   Resp 16   Ht 5' 10.5" (1.791 m)   Wt 82.1 kg   SpO2 100%   BMI 25.60 kg/m   Physical Exam Vitals signs and nursing note reviewed.  Constitutional:      Appearance: He is well-developed.  HENT:     Head: Normocephalic.  Eyes:     Extraocular Movements: Extraocular movements intact.  Neck:     Musculoskeletal: Normal range of motion.  Cardiovascular:     Rate and Rhythm: Normal rate.  Pulmonary:     Effort: Pulmonary effort is normal.  Abdominal:     General: Abdomen is flat. Bowel sounds are normal. There is no distension.     Palpations: Abdomen is soft.     Tenderness: There is left CVA tenderness.  Musculoskeletal: Normal range of motion.  Skin:    General: Skin is warm.  Neurological:     General: No focal deficit present.     Mental Status: He is alert and oriented to person, place, and time.  Psychiatric:        Mood and  Affect: Mood normal.     ED Treatments / Results  Labs (all labs ordered are listed, but only abnormal results are displayed) Labs Reviewed  COMPREHENSIVE METABOLIC PANEL - Abnormal; Notable for the following components:  Result Value   Glucose, Bld 104 (*)    Creatinine, Ser 1.58 (*)    GFR calc non Af Amer 48 (*)    GFR calc Af Amer 55 (*)    All other components within normal limits  CBC - Abnormal; Notable for the following components:   WBC 13.4 (*)    Hemoglobin 12.3 (*)    All other components within normal limits  URINALYSIS, ROUTINE W REFLEX MICROSCOPIC - Abnormal; Notable for the following components:   Hgb urine dipstick MODERATE (*)    Bacteria, UA RARE (*)    All other components within normal limits  LIPASE, BLOOD    EKG None  Radiology Ct Abdomen Pelvis W Contrast  Result Date: 11/08/2018 CLINICAL DATA:  Left lower quadrant pain radiating to the left flank starting this morning. Nausea. Frequent urination. EXAM: CT ABDOMEN AND PELVIS WITH CONTRAST TECHNIQUE: Multidetector CT imaging of the abdomen and pelvis was performed using the standard protocol following bolus administration of intravenous contrast. CONTRAST:  145mL OMNIPAQUE IOHEXOL 300 MG/ML  SOLN COMPARISON:  08/25/2017 FINDINGS: Lower chest: Lung bases are clear. Hepatobiliary: No focal liver abnormality is seen. No gallstones, gallbladder wall thickening, or biliary dilatation. Pancreas: Unremarkable. No pancreatic ductal dilatation or surrounding inflammatory changes. Spleen: Normal in size without focal abnormality. Adrenals/Urinary Tract: Surgical absence of the right adrenal gland. No left adrenal nodules. Mild left hydronephrosis. Several stones demonstrated in the distal left ureter just above the ureterovesical junction. Maximal diameter is about 4 mm. There is mild proximal hydronephrosis and hydroureter with stranding around the left kidney. Delayed left nephrogram. Right kidney and ureter are  unremarkable. Bladder appears normal. Stomach/Bowel: Stomach and small bowel are decompressed. Partial right colectomy with ileal transverse anastomosis. No large or small bowel distention. No wall thickening or inflammatory changes. Vascular/Lymphatic: Aortic atherosclerosis. No enlarged abdominal or pelvic lymph nodes. Reproductive: Prostate gland is enlarged, measuring 4.1 cm diameter. Other: No free air or free fluid in the abdomen. Anterior abdominal wall scarring and postoperative changes consistent with mesh repair. There is residual or recurrent ventral hernia containing bowel but without proximal obstruction. Musculoskeletal: No acute or significant osseous findings. IMPRESSION: 1. Several stones demonstrated in the distal left ureter, measuring up to 4 mm diameter with moderate proximal obstruction. 2. Partial right colectomy with ileal transverse anastomosis. 3. Residual or recurrent ventral hernia containing bowel but without proximal obstruction. Postoperative mesh repair. 4. Prostate gland is mildly enlarged. Electronically Signed   By: Lucienne Capers M.D.   On: 11/08/2018 20:02    Procedures Procedures (including critical care time)  Medications Ordered in ED Medications  HYDROmorphone (DILAUDID) injection 1 mg (1 mg Intravenous Given 11/08/18 1910)  ondansetron (ZOFRAN) injection 4 mg (4 mg Intravenous Given 11/08/18 1910)  iohexol (OMNIPAQUE) 300 MG/ML solution 100 mL (100 mLs Intravenous Contrast Given 11/08/18 1934)  ketorolac (TORADOL) 30 MG/ML injection 30 mg (30 mg Intravenous Given 11/08/18 2121)  fentaNYL (SUBLIMAZE) injection 100 mcg (100 mcg Intravenous Given 11/08/18 2121)     Initial Impression / Assessment and Plan / ED Course  I have reviewed the triage vital signs and the nursing notes.  Pertinent labs & imaging results that were available during my care of the patient were reviewed by me and considered in my medical decision making (see chart for details).    MDM    Pt given dilauid IV.  No relief.  Pt given fentanyl 100and torodol.  Pt feels better.  Pt counseled on stone ct reviewed  with pt.  Pt is in pain management.  No control at home from percocet.  Pt given rx for dilaudid.  Pt advised to call his pain mangement MD tomorrow    Final Clinical Impressions(s) / ED Diagnoses   Final diagnoses:  Kidney stones  Kidney stone    ED Discharge Orders         Ordered    oxyCODONE-acetaminophen (PERCOCET/ROXICET) 5-325 MG tablet  Every 4 hours PRN,   Status:  Discontinued     11/08/18 2217    tamsulosin (FLOMAX) 0.4 MG CAPS capsule  Daily,   Status:  Discontinued     11/08/18 2217    ondansetron (ZOFRAN ODT) 4 MG disintegrating tablet  Every 8 hours PRN     11/08/18 2217    HYDROmorphone (DILAUDID) 2 MG tablet  Every 4 hours PRN     11/08/18 2227    tamsulosin (FLOMAX) 0.4 MG CAPS capsule  Daily     11/08/18 2227    ondansetron (ZOFRAN ODT) 4 MG disintegrating tablet  Every 8 hours PRN     11/08/18 2227        An After Visit Summary was printed and given to the patient.    Fransico Meadow, [place] 11/09/18 1557    Duffy Bruce, MD 11/10/18 [phone]

## 2018-11-08 NOTE — ED Notes (Signed)
Patient transported to CT 

## 2018-11-08 NOTE — ED Notes (Signed)
Pt notified RN that he is in pain clinic  And had concerns about RX, RN advised pt to call clinic first thing in the AM to make them aware of visit to ER and DX-Monique,RN

## 2018-11-12 ENCOUNTER — Other Ambulatory Visit: Payer: Self-pay | Admitting: Family Medicine

## 2018-11-12 ENCOUNTER — Ambulatory Visit
Admission: RE | Admit: 2018-11-12 | Discharge: 2018-11-12 | Disposition: A | Discharge status: Home / Self Care | Payer: Medicare HMO | Source: Ambulatory Visit | Attending: Family Medicine | Admitting: Family Medicine

## 2018-11-12 DIAGNOSIS — R05 Cough: Secondary | ICD-10-CM

## 2018-11-12 DIAGNOSIS — R0982 Postnasal drip: Secondary | ICD-10-CM | POA: Diagnosis not present

## 2018-11-12 DIAGNOSIS — R059 Cough, unspecified: Secondary | ICD-10-CM

## 2018-11-16 DIAGNOSIS — N201 Calculus of ureter: Secondary | ICD-10-CM | POA: Diagnosis not present

## 2018-11-19 NOTE — Addendum Note (Signed)
Addendum  created 11/19/18 0851 by Lynda Rainwater, MD   Delete clinical note

## 2018-11-22 ENCOUNTER — Encounter (HOSPITAL_BASED_OUTPATIENT_CLINIC_OR_DEPARTMENT_OTHER): Payer: Self-pay | Admitting: *Deleted

## 2018-11-22 ENCOUNTER — Other Ambulatory Visit: Payer: Self-pay | Admitting: Urology

## 2018-11-22 ENCOUNTER — Other Ambulatory Visit: Payer: Self-pay

## 2018-11-22 DIAGNOSIS — N201 Calculus of ureter: Secondary | ICD-10-CM | POA: Diagnosis not present

## 2018-11-22 NOTE — Progress Notes (Signed)
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

## 2018-11-24 ENCOUNTER — Encounter (HOSPITAL_BASED_OUTPATIENT_CLINIC_OR_DEPARTMENT_OTHER): Payer: Self-pay | Admitting: *Deleted

## 2018-11-24 ENCOUNTER — Encounter (HOSPITAL_BASED_OUTPATIENT_CLINIC_OR_DEPARTMENT_OTHER)
Admission: RE | Disposition: A | Discharge status: Home / Self Care | Payer: Self-pay | Source: Home / Self Care | Attending: Urology

## 2018-11-24 ENCOUNTER — Ambulatory Visit (HOSPITAL_BASED_OUTPATIENT_CLINIC_OR_DEPARTMENT_OTHER)
Admission: RE | Admit: 2018-11-24 | Discharge: 2018-11-24 | Disposition: A | Discharge status: Home / Self Care | Payer: Medicare HMO | Attending: Urology | Admitting: Urology

## 2018-11-24 ENCOUNTER — Ambulatory Visit (HOSPITAL_BASED_OUTPATIENT_CLINIC_OR_DEPARTMENT_OTHER): Payer: Medicare HMO | Admitting: Anesthesiology

## 2018-11-24 ENCOUNTER — Other Ambulatory Visit: Payer: Self-pay

## 2018-11-24 DIAGNOSIS — I10 Essential (primary) hypertension: Secondary | ICD-10-CM | POA: Diagnosis not present

## 2018-11-24 DIAGNOSIS — J449 Chronic obstructive pulmonary disease, unspecified: Secondary | ICD-10-CM | POA: Insufficient documentation

## 2018-11-24 DIAGNOSIS — F419 Anxiety disorder, unspecified: Secondary | ICD-10-CM | POA: Insufficient documentation

## 2018-11-24 DIAGNOSIS — K219 Gastro-esophageal reflux disease without esophagitis: Secondary | ICD-10-CM | POA: Insufficient documentation

## 2018-11-24 DIAGNOSIS — Z87891 Personal history of nicotine dependence: Secondary | ICD-10-CM | POA: Diagnosis not present

## 2018-11-24 DIAGNOSIS — Z79899 Other long term (current) drug therapy: Secondary | ICD-10-CM | POA: Diagnosis not present

## 2018-11-24 DIAGNOSIS — N201 Calculus of ureter: Secondary | ICD-10-CM | POA: Insufficient documentation

## 2018-11-24 HISTORY — PX: CYSTOSCOPY/URETEROSCOPY/HOLMIUM LASER/STENT PLACEMENT: SHX6546

## 2018-11-24 HISTORY — DX: Other general symptoms and signs: R68.89

## 2018-11-24 LAB — POCT I-STAT 4, (NA,K, GLUC, HGB,HCT)
Glucose, Bld: 85 mg/dL (ref 70–99)
HCT: 34 % — ABNORMAL LOW (ref 39.0–52.0)
Hemoglobin: 11.6 g/dL — ABNORMAL LOW (ref 13.0–17.0)
Potassium: 3.4 mmol/L — ABNORMAL LOW (ref 3.5–5.1)
Sodium: 143 mmol/L (ref 135–145)

## 2018-11-24 SURGERY — CYSTOSCOPY/URETEROSCOPY/HOLMIUM LASER/STENT PLACEMENT
Anesthesia: General | Site: Bladder | Laterality: Left

## 2018-11-24 MED ORDER — ONDANSETRON HCL 4 MG/2ML IJ SOLN
INTRAMUSCULAR | Status: DC | PRN
  Administered 2018-11-24: 4 mg via INTRAVENOUS

## 2018-11-24 MED ORDER — ONDANSETRON HCL 4 MG/2ML IJ SOLN
INTRAMUSCULAR | Status: AC
  Filled 2018-11-24: qty 2

## 2018-11-24 MED ORDER — PROPOFOL 10 MG/ML IV BOLUS
INTRAVENOUS | Status: AC
  Filled 2018-11-24: qty 20

## 2018-11-24 MED ORDER — MIDAZOLAM HCL 2 MG/2ML IJ SOLN
INTRAMUSCULAR | Status: AC
  Filled 2018-11-24: qty 2

## 2018-11-24 MED ORDER — SODIUM CHLORIDE 0.9 % IR SOLN
Status: DC | PRN
  Administered 2018-11-24: 1000 mL via INTRAVESICAL

## 2018-11-24 MED ORDER — FENTANYL CITRATE (PF) 100 MCG/2ML IJ SOLN
INTRAMUSCULAR | Status: DC | PRN
  Administered 2018-11-24 (×3): 25 ug via INTRAVENOUS
  Administered 2018-11-24: 50 ug via INTRAVENOUS
  Administered 2018-11-24 (×3): 25 ug via INTRAVENOUS

## 2018-11-24 MED ORDER — PROMETHAZINE HCL 25 MG/ML IJ SOLN
6.2500 mg | INTRAMUSCULAR | Status: DC | PRN
  Filled 2018-11-24: qty 1

## 2018-11-24 MED ORDER — TAMSULOSIN HCL 0.4 MG PO CAPS: 0.4000 mg | ORAL_CAPSULE | Freq: Every day | ORAL | 3 refills | Status: DC

## 2018-11-24 MED ORDER — MIDAZOLAM HCL 2 MG/2ML IJ SOLN
2.0000 mg | Freq: Once | INTRAMUSCULAR | Status: AC
  Administered 2018-11-24: 2 mg via INTRAVENOUS
  Filled 2018-11-24: qty 2

## 2018-11-24 MED ORDER — CIPROFLOXACIN IN D5W 400 MG/200ML IV SOLN
INTRAVENOUS | Status: DC | PRN
  Administered 2018-11-24: 400 mg via INTRAVENOUS

## 2018-11-24 MED ORDER — DEXAMETHASONE SODIUM PHOSPHATE 4 MG/ML IJ SOLN
INTRAMUSCULAR | Status: DC | PRN
  Administered 2018-11-24: 10 mg via INTRAVENOUS

## 2018-11-24 MED ORDER — LACTATED RINGERS IV SOLN
INTRAVENOUS | Status: DC
  Administered 2018-11-24 (×2): via INTRAVENOUS
  Filled 2018-11-24: qty 1000

## 2018-11-24 MED ORDER — CEFAZOLIN SODIUM-DEXTROSE 2-4 GM/100ML-% IV SOLN
2.0000 g | INTRAVENOUS | Status: DC
  Filled 2018-11-24: qty 100

## 2018-11-24 MED ORDER — FENTANYL CITRATE (PF) 100 MCG/2ML IJ SOLN
INTRAMUSCULAR | Status: AC
  Filled 2018-11-24: qty 2

## 2018-11-24 MED ORDER — FENTANYL CITRATE (PF) 100 MCG/2ML IJ SOLN
25.0000 ug | INTRAMUSCULAR | Status: DC | PRN
  Filled 2018-11-24: qty 1

## 2018-11-24 MED ORDER — KETOROLAC TROMETHAMINE 30 MG/ML IJ SOLN
INTRAMUSCULAR | Status: AC
  Filled 2018-11-24: qty 1

## 2018-11-24 MED ORDER — METOCLOPRAMIDE HCL 5 MG/ML IJ SOLN
INTRAMUSCULAR | Status: AC
  Filled 2018-11-24: qty 2

## 2018-11-24 MED ORDER — CIPROFLOXACIN IN D5W 400 MG/200ML IV SOLN
INTRAVENOUS | Status: AC
  Filled 2018-11-24: qty 200

## 2018-11-24 MED ORDER — HYDROCODONE-ACETAMINOPHEN 5-325 MG PO TABS: 1.0000 | ORAL_TABLET | ORAL | 0 refills | Status: DC | PRN

## 2018-11-24 MED ORDER — LIDOCAINE HCL (CARDIAC) PF 100 MG/5ML IV SOSY
PREFILLED_SYRINGE | INTRAVENOUS | Status: DC | PRN
  Administered 2018-11-24: 80 mg via INTRAVENOUS

## 2018-11-24 MED ORDER — KETOROLAC TROMETHAMINE 30 MG/ML IJ SOLN
INTRAMUSCULAR | Status: DC | PRN
  Administered 2018-11-24: 30 mg via INTRAVENOUS

## 2018-11-24 MED ORDER — PROPOFOL 10 MG/ML IV BOLUS
INTRAVENOUS | Status: DC | PRN
  Administered 2018-11-24: 160 mg via INTRAVENOUS
  Administered 2018-11-24: 50 mg via INTRAVENOUS
  Administered 2018-11-24: 40 mg via INTRAVENOUS
  Administered 2018-11-24 (×3): 50 mg via INTRAVENOUS

## 2018-11-24 MED ORDER — METOCLOPRAMIDE HCL 5 MG/ML IJ SOLN
INTRAMUSCULAR | Status: DC | PRN
  Administered 2018-11-24: 10 mg via INTRAVENOUS

## 2018-11-24 MED ORDER — IOHEXOL 300 MG/ML  SOLN
INTRAMUSCULAR | Status: DC | PRN
  Administered 2018-11-24: 10 mL via URETHRAL

## 2018-11-24 MED ORDER — KETOROLAC TROMETHAMINE 30 MG/ML IJ SOLN
30.0000 mg | Freq: Once | INTRAMUSCULAR | Status: DC | PRN
  Filled 2018-11-24: qty 1

## 2018-11-24 MED ORDER — LIDOCAINE 2% (20 MG/ML) 5 ML SYRINGE
INTRAMUSCULAR | Status: AC
  Filled 2018-11-24: qty 5

## 2018-11-24 MED ORDER — DEXAMETHASONE SODIUM PHOSPHATE 10 MG/ML IJ SOLN
INTRAMUSCULAR | Status: AC
  Filled 2018-11-24: qty 1

## 2018-11-24 SURGICAL SUPPLY — 19 items
BAG DRAIN URO-CYSTO SKYTR STRL (DRAIN) ×2 IMPLANT
CATH INTERMIT  6FR 70CM (CATHETERS) IMPLANT
CLOTH BEACON ORANGE TIMEOUT ST (SAFETY) ×2 IMPLANT
FIBER LASER FLEXIVA 365 (UROLOGICAL SUPPLIES) IMPLANT
FIBER LASER TRAC TIP (UROLOGICAL SUPPLIES) ×1 IMPLANT
GLOVE BIO SURGEON STRL SZ7.5 (GLOVE) ×2 IMPLANT
GOWN STRL REUS W/TWL XL LVL3 (GOWN DISPOSABLE) IMPLANT
GUIDEWIRE STR DUAL SENSOR (WIRE) ×2 IMPLANT
INFUSOR MANOMETER BAG 3000ML (MISCELLANEOUS) ×1 IMPLANT
IV NS 1000ML (IV SOLUTION)
IV NS 1000ML BAXH (IV SOLUTION) ×1 IMPLANT
IV NS IRRIG 3000ML ARTHROMATIC (IV SOLUTION) ×2 IMPLANT
KIT TURNOVER CYSTO (KITS) ×2 IMPLANT
MANIFOLD NEPTUNE II (INSTRUMENTS) ×2 IMPLANT
NS IRRIG 500ML POUR BTL (IV SOLUTION) ×3 IMPLANT
PACK CYSTO (CUSTOM PROCEDURE TRAY) ×2 IMPLANT
STENT URET 6FRX26 CONTOUR (STENTS) ×1 IMPLANT
TUBE CONNECTING 12X1/4 (SUCTIONS) ×1 IMPLANT
TUBING UROLOGY SET (TUBING) ×1 IMPLANT

## 2018-11-24 NOTE — Discharge Instructions (Addendum)
[facility] °[phone] °Post Ureteroscopy With or Without Stent Instructions ° °Definitions: ° °Ureter: The duct that transports urine from the kidney to the bladder. °Stent:   A plastic hollow tube that is placed into the ureter, from the kidney to the                 bladder to prevent the ureter from swelling shut. ° °GENERAL INSTRUCTIONS: ° °Despite the fact that no skin incisions were used, the area around the ureter and bladder is raw and irritated. The stent is a foreign body which will further irritate the bladder wall. This irritation is manifested by increased frequency of urination, both day and night, and by an increase in the urge to urinate. In some, the urge to urinate is present almost always. Sometimes the urge is strong enough that you may not be able to stop yourself from urinating. The only real cure is to remove the stent and then give time for the bladder wall to heal which can't be done until the danger of the ureter swelling shut has passed, which varies. ° °You may see some blood in your urine while the stent is in place and a few days afterwards. Do not be alarmed, even if the urine was clear for a while. Get off your feet and drink lots of fluids until clearing occurs. If you start to pass clots or don't improve, call us. ° °DIET: °You may return to your normal diet immediately. Because of the raw surface of your bladder, alcohol, spicy foods, acid type foods and drinks with caffeine may cause irritation or frequency and should be used in moderation. To keep your urine flowing freely and to avoid constipation, drink plenty of fluids during the day ( 8-10 glasses ). °Tip: Avoid cranberry juice because it is very acidic. ° °ACTIVITY: °Your physical activity doesn't need to be restricted. However, if you are very active, you may see some blood in your urine. We suggest that you reduce your activity under these circumstances until the bleeding has stopped. ° °BOWELS: °It is  important to keep your bowels regular during the postoperative period. Straining with bowel movements can cause bleeding. A bowel movement every other day is reasonable. Use a mild laxative if needed, such as Milk of Magnesia 2-3 tablespoons, or 2 Dulcolax tablets. Call if you continue to have problems. If you have been taking narcotics for pain, before, during or after your surgery, you may be constipated. Take a laxative if necessary. ° ° °MEDICATION: °You should resume your pre-surgery medications unless told not to. °You may take oxybutynin or flomax if prescribed for bladder spasms or discomfort from the stent °Take pain medication as directed for pain refractory to conservative management ° °PROBLEMS YOU SHOULD REPORT TO US: °· Fevers over 100.5 Fahrenheit. °· Heavy bleeding, or clots ( See above notes about blood in urine ). °· Inability to urinate. °· Drug reactions ( hives, rash, nausea, vomiting, diarrhea ). °· Severe burning or pain with urination that is not improving. ° ° °Post Anesthesia Home Care Instructions ° °Activity: °Get plenty of rest for the remainder of the day. A responsible individual must stay with you for 24 hours following the procedure.  °For the next 24 hours, DO NOT: °-Drive a car °-Operate machinery °-Drink alcoholic beverages °-Take any medication unless instructed by your physician °-Make any legal decisions or sign important papers. ° °Meals: °Start with liquid foods such as gelatin or soup. Progress to regular foods   as tolerated. Avoid greasy, spicy, heavy foods. If nausea and/or vomiting occur, drink only clear liquids until the nausea and/or vomiting subsides. Call your physician if vomiting continues. ° °Special Instructions/Symptoms: °Your throat may feel dry or sore from the anesthesia or the breathing tube placed in your throat during surgery. If this causes discomfort, gargle with warm salt water. The discomfort should disappear within 24 hours. ° °If you had a scopolamine  patch placed behind your ear for the management of post- operative nausea and/or vomiting: ° °1. The medication in the patch is effective for 72 hours, after which it should be removed.  Wrap patch in a tissue and discard in the trash. Wash hands thoroughly with soap and water. °2. You may remove the patch earlier than 72 hours if you experience unpleasant side effects which may include dry mouth, dizziness or visual disturbances. °3. Avoid touching the patch. Wash your hands with soap and water after contact with the patch. °  ° ° °

## 2018-11-24 NOTE — H&P (Signed)
CC/HPI: Cc: Left ureteral calculi  HPI:  11/16/2018  Patient underwent a CT scan of the abdomen and pelvis on 11/08/2018 for abdominal pain. This revealed several stacked left ureteral calculi up to 4-5 mm in size. He has not passed the stones. He continues to have left-sided flank pain. Moderately controlled currently. Denies any fever, chill, nausea, vomiting.   11/22/18: He returns today for follow up. He denies seeing any stones pass in the interim. He denies any current pain and states that prior pain he was experiencing subsided about 1 weeks ago. He does complain of some increased urinary frequency from his baseline. He denies dysuria, gross hematuria, fevers, chills, nausea, or vomiting. He is not on blood thinners. No issues with aesthesia in the past. He does have issues with anxiety per his report.     ALLERGIES: Chantix morphine penicillin    MEDICATIONS: Cialis 5 mg tablet  Tamsulosin Hcl 0.4 mg capsule  Viagra 100 mg tablet  Amlodipine Besylate 5 mg tablet  Baclofen 10 mg tablet  Diclofenac Sodium  Lidocaine  Lorazepam 2 mg tablet  Multivitamin  Oxycodone-Acetaminophen 10 mg-325 mg tablet  Potassium Chloride 20 meq tablet, extended release  Probiotic  Promethazine Hcl 12.5 mg tablet  Telmisartan 80 mg tablet  Tessalon Perle 100 mg capsule  Trazodone Hcl 100 mg tablet     GU PSH: None     PSH Notes: colon surgery     NON-GU PSH: Appendectomy (laparoscopic) Neck Surgery Shoulder Surgery (Unspecified)    GU PMH: Renal colic - 7/82/4235 Ureteral calculus - 11/16/2018      PMH Notes:  1898-10-27 00:00:00 - Note: Normal Routine History And Physical Adult   NON-GU PMH: Anxiety Arthritis GERD Hypertension    FAMILY HISTORY: None   SOCIAL HISTORY: Marital Status: Married Preferred Language: English; Race: Black or African American Current Smoking Status: Patient does not smoke anymore.   Tobacco Use Assessment Completed: Used Tobacco in last 30  days? Drinks 1 caffeinated drink per day.    REVIEW OF SYSTEMS:    GU Review Male:   Patient denies frequent urination, hard to postpone urination, burning/ pain with urination, get up at night to urinate, leakage of urine, stream starts and stops, trouble starting your stream, have to strain to urinate , erection problems, and penile pain.  Gastrointestinal (Upper):   Patient denies nausea, vomiting, and indigestion/ heartburn.  Gastrointestinal (Lower):   Patient denies diarrhea and constipation.  Constitutional:   Patient denies fever, night sweats, weight loss, and fatigue.  Skin:   Patient denies skin rash/ lesion and itching.  Eyes:   Patient denies blurred vision and double vision.  Ears/ Nose/ Throat:   Patient denies sore throat and sinus problems.  Hematologic/Lymphatic:   Patient denies swollen glands and easy bruising.  Cardiovascular:   Patient denies chest pains and leg swelling.  Respiratory:   Patient denies cough and shortness of breath.  Endocrine:   Patient denies excessive thirst.  Musculoskeletal:   Patient denies back pain and joint pain.  Neurological:   Patient denies headaches and dizziness.  Psychologic:   Patient denies depression and anxiety.   VITAL SIGNS:      11/22/2018 10:11 AM  Weight 184 lb / 83.46 kg  Height 70 in / 177.8 cm  BP 136/85 mmHg  Pulse 83 /min  BMI 26.4 kg/m   MULTI-SYSTEM PHYSICAL EXAMINATION:    Constitutional: Well-nourished. No physical deformities. Normally developed. Good grooming.  Respiratory: Normal breath sounds. No labored breathing,  no use of accessory muscles.   Cardiovascular: Regular rate and rhythm. No murmur, no gallop. Normal temperature, normal extremity pulses, no swelling, no varicosities.   Neurologic / Psychiatric: Oriented to time, oriented to place, oriented to person. No depression, no anxiety, no agitation.  Gastrointestinal: No mass, no tenderness, no rigidity, non obese abdomen. No CVA tenderness bilaterally   Musculoskeletal: Normal gait and station of head and neck.     PAST DATA REVIEWED:  Source Of History:  Patient  Records Review:   Previous Patient Records  Urine Test Review:   Urinalysis  X-Ray Review: KUB: Reviewed Films.  C.T. Abdomen/Pelvis: Reviewed Films. Reviewed Report.     04/02/06  PSA  Total PSA 0.82     PROCEDURES:         KUB - 74018  A single view of the abdomen is obtained. No obvious opacities noted within the confines of the right renal shadow or along the expected anatomical tract of the right ureter. No obvious opacities noted within the confines of the left renal shadow. There continue to be several opacities noted along the expected anatomical course of the distal left ureter, which is consistent with stones noted on prior studies. There appears to be minimal progression when compared to prior imaging studies. Prominent, but normal appearing bowel gas pattern.                Urinalysis w/Scope Dipstick Dipstick Cont'd Micro  Color: Yellow Bilirubin: Neg mg/dL WBC/hpf: 0 - 5/hpf  Appearance: Clear Ketones: Neg mg/dL RBC/hpf: 3 - 10/hpf  Specific Gravity: 1.025 Blood: 1+ ery/uL Bacteria: Rare (0-9/hpf)  pH: 6.0 Protein: Neg mg/dL Cystals: NS (Not Seen)  Glucose: Neg mg/dL Urobilinogen: 0.2 mg/dL Casts: NS (Not Seen)    Nitrites: Neg Trichomonas: Not Present    Leukocyte Esterase: Neg leu/uL Mucous: Present      Epithelial Cells: NS (Not Seen)      Yeast: NS (Not Seen)      Sperm: Not Present    ASSESSMENT:      ICD-10 Details  1 GU:   Ureteral calculus - N20.1    PLAN:           Orders Labs Urine Culture          Document Letter(s):  Created for Patient: Clinical Summary         Notes:   Urinalysis today is without infectious parameters, but does show microscopic hematuria. I will send for urine culture given upcoming procedure. On KUB imaging study today, there continue to be several opacities noted along the expected anatomical course of the  distal left ureter, which is consistent with stones noted on prior studies. There appears to be minimal progression when compared to prior imaging studies. I will review with his urologist, but recommended proceeding with ureteroscopy, as planned. I will keep him updated regarding alternative recommendations. We again reviewed indications and potential risks of the ureteroscopy procedure. He voiced understanding. Return precuations were reviewed in the interim and he voiced understanding.         Next Appointment:      Next Appointment: 11/24/2018 09:45 AM    Appointment Type: Surgery     Location: [facility] [phone]    Provider: Link Snuffer, III, M.D.    Reason for Visit: NE/OP LEFT URS LL STENT    Signed by Azucena Fallen on 11/22/18 at 2:38 PM (EST

## 2018-11-24 NOTE — Transfer of Care (Signed)
Immediate Anesthesia Transfer of Care Note  Patient: [name] Gautreau Sr.  Procedure(s) Performed: Procedure(s) (LRB): CYSTOSCOPY/URETEROSCOPY/HOLMIUM LASER/STENT PLACEMENT (Left)  Patient Location: PACU  Anesthesia Type: General  Level of Consciousness: awake, sedated, patient cooperative and responds to stimulation  Airway & Oxygen Therapy: Patient Spontanous Breathing and Patient connected to [place] oxygen  Post-op Assessment: Report given to PACU RN, Post -op Vital signs reviewed and stable and Patient moving all extremities  Post vital signs: Reviewed and stable  Complications: No apparent anesthesia complications

## 2018-11-24 NOTE — Anesthesia Preprocedure Evaluation (Signed)
Anesthesia Evaluation  Patient identified by MRN, date of birth, ID band Patient awake    Reviewed: Allergy & Precautions, NPO status , Patient's Chart, lab work & pertinent test results  Airway Mallampati: II  TM Distance: >3 FB Neck ROM: Full    Dental no notable dental hx.    Pulmonary COPD, Current Smoker,    Pulmonary exam normal breath sounds clear to auscultation       Cardiovascular hypertension, Normal cardiovascular exam Rhythm:Regular Rate:Normal     Neuro/Psych Anxiety negative neurological ROS     GI/Hepatic Neg liver ROS, GERD  ,  Endo/Other  Hypothyroidism   Renal/GU negative Renal ROS  negative genitourinary   Musculoskeletal negative musculoskeletal ROS (+)   Abdominal   Peds negative pediatric ROS (+)  Hematology negative hematology ROS (+)   Anesthesia Other Findings   Reproductive/Obstetrics negative OB ROS                             Anesthesia Physical Anesthesia Plan  ASA: III  Anesthesia Plan: General   Post-op Pain Management:    Induction: Intravenous  PONV Risk Score and Plan: 2 and Ondansetron, Dexamethasone and Treatment may vary due to age or medical condition  Airway Management Planned: LMA  Additional Equipment:   Intra-op Plan:   Post-operative Plan: Extubation in OR  Informed Consent: I have reviewed the patients History and Physical, chart, labs and discussed the procedure including the risks, benefits and alternatives for the proposed anesthesia with the patient or authorized representative who has indicated his/her understanding and acceptance.     Dental advisory given  Plan Discussed with: CRNA and Surgeon  Anesthesia Plan Comments:         Anesthesia Quick Evaluation

## 2018-11-24 NOTE — Op Note (Addendum)
Operative Note   Preoperative diagnosis:  1.  Left ureteral calculus  Postoperative diagnosis: 1.  Left ureteral calculus  Procedure(s): 1.  Cystoscopy with left retrograde pyelogram, left ureteroscopy with laser lithotripsy, ureteral stent placement  Surgeon: Link Snuffer, MD  Assistants: None  Anesthesia: General  Complications: None immediate  EBL: Minimal  Specimens: 1.  None  Drains/Catheters: 1.  6 x 26 double-J ureteral stent  Intraoperative findings: Approximately 4 mm distal ureteral calculus fragmented to tiny fragments.  Retrograde pyelogram revealed no evidence of filling defect afterwards.  Cystoscopy was normal.  Indication: 58 year old male who failed to pass a 4 mm left distal ureteral calculus presents for the previously mentioned operation.  Description of procedure:  The patient was identified and consent was obtained.  The patient was taken to the operating room and placed in the supine position.  The patient was placed under general anesthesia.  Perioperative antibiotics were administered.  The patient was placed in dorsal lithotomy.  Patient was prepped and draped in a standard sterile fashion and a timeout was performed.  A 21 French rigid cystoscope was advanced into the urethra and into the bladder.  Complete cystoscopy was performed with no abnormal findings.  The left ureter was cannulated with a sensor wire which was advanced up to the kidney under fluoroscopic guidance.  A semirigid ureteroscope was advanced alongside the wire up to the stone of interest which was fragmented to tiny fragments with a laser fiber.  I advanced the scope up to the renal pelvis and there were no other ureteral calculi.  I shot a retrograde pyelogram through the scope with no abnormal findings.  Hydronephrosis appears to have resolved.  I then carefully withdrew the scope.  There were no clinically significant stone fragments and there was no ureteral injury.  I then  backloaded the wire onto a rigid cystoscope and advanced that into the bladder followed by routine placement of a 6 x 26 double-J ureteral stent followed by removal of the wire.  Fluoroscopy confirmed proximal placement and direct visualization confirmed a good coil within the bladder.  I drained the bladder and withdrew the scope and this concluded the operation.  Patient tolerated procedure well was stable postoperatively.  Plan: return in 1 week for ureteral stent removal.

## 2018-11-24 NOTE — Anesthesia Procedure Notes (Signed)
Procedure Name: LMA Insertion Date/Time: 11/24/2018 10:22 AM Performed by: Justice Rocher, CRNA Pre-anesthesia Checklist: Patient identified, Emergency Drugs available, Suction available and Patient being monitored Patient Re-evaluated:Patient Re-evaluated prior to induction Oxygen Delivery Method: Circle system utilized Preoxygenation: Pre-oxygenation with 100% oxygen Induction Type: IV induction Ventilation: Mask ventilation without difficulty LMA: LMA inserted LMA Size: 5.0 Number of attempts: 1 Airway Equipment and Method: Bite block Placement Confirmation: positive ETCO2 and breath sounds checked- equal and bilateral Tube secured with: Tape Dental Injury: Teeth and Oropharynx as per pre-operative assessment

## 2018-11-24 NOTE — Anesthesia Postprocedure Evaluation (Signed)
Anesthesia Post Note  Patient: [name] Simkins Sr.  Procedure(s) Performed: CYSTOSCOPY/URETEROSCOPY/HOLMIUM LASER/STENT PLACEMENT (Left Bladder)     Patient location during evaluation: PACU Anesthesia Type: General Level of consciousness: awake and alert Pain management: pain level controlled Vital Signs Assessment: post-procedure vital signs reviewed and stable Respiratory status: spontaneous breathing, nonlabored ventilation, respiratory function stable and patient connected to nasal cannula oxygen Cardiovascular status: blood pressure returned to baseline and stable Postop Assessment: no apparent nausea or vomiting Anesthetic complications: no    Last Vitals:  Vitals:   11/24/18 1115 11/24/18 1130  BP: 121/80 135/86  Pulse: 79 79  Resp: 13 13  Temp:    SpO2: 100% 100%    Last Pain:  Vitals:   11/24/18 1130  TempSrc:   PainSc: 0-No pain                 Payslie Mccaig S

## 2018-11-25 ENCOUNTER — Encounter (HOSPITAL_BASED_OUTPATIENT_CLINIC_OR_DEPARTMENT_OTHER): Payer: Self-pay | Admitting: Urology

## 2018-11-30 DIAGNOSIS — N201 Calculus of ureter: Secondary | ICD-10-CM | POA: Diagnosis not present

## 2018-12-22 ENCOUNTER — Encounter: Payer: Self-pay | Admitting: Psychology

## 2019-01-11 DIAGNOSIS — M5416 Radiculopathy, lumbar region: Secondary | ICD-10-CM | POA: Diagnosis not present

## 2019-01-11 DIAGNOSIS — I10 Essential (primary) hypertension: Secondary | ICD-10-CM | POA: Diagnosis not present

## 2019-01-11 DIAGNOSIS — Z6826 Body mass index (BMI) 26.0-26.9, adult: Secondary | ICD-10-CM | POA: Diagnosis not present

## 2019-01-20 DIAGNOSIS — Z79899 Other long term (current) drug therapy: Secondary | ICD-10-CM | POA: Diagnosis not present

## 2019-03-18 DIAGNOSIS — Z125 Encounter for screening for malignant neoplasm of prostate: Secondary | ICD-10-CM | POA: Diagnosis not present

## 2019-03-18 DIAGNOSIS — Z79899 Other long term (current) drug therapy: Secondary | ICD-10-CM | POA: Diagnosis not present

## 2019-03-18 DIAGNOSIS — R7301 Impaired fasting glucose: Secondary | ICD-10-CM | POA: Diagnosis not present

## 2019-03-18 DIAGNOSIS — E7800 Pure hypercholesterolemia, unspecified: Secondary | ICD-10-CM | POA: Diagnosis not present

## 2019-03-22 ENCOUNTER — Other Ambulatory Visit: Payer: Self-pay

## 2019-03-22 ENCOUNTER — Encounter: Payer: Medicare HMO | Attending: Psychology | Admitting: Psychology

## 2019-03-22 ENCOUNTER — Encounter: Payer: Self-pay | Admitting: Psychology

## 2019-03-22 DIAGNOSIS — F4001 Agoraphobia with panic disorder: Secondary | ICD-10-CM | POA: Diagnosis not present

## 2019-03-22 DIAGNOSIS — F411 Generalized anxiety disorder: Secondary | ICD-10-CM

## 2019-03-22 NOTE — Progress Notes (Signed)
Neuropsychological Consultation   Patient:   [name] Birchard Sr.   DOB:   1961/01/29  MR Number:  [id number]  Location:  [facility] [place], [place] [id number] [place] [id number] Dept: [phone]           Date of Service:   03/22/2019  Start Time:   9 AM End Time:   10 AM  Today's visit was an in person visit with the patient and myself and my outpatient clinical office.  The first hour was face-to-face with the patient in the second hour of this visit was records review and report writing.  Provider/Observer:  Ilean Skill, Psy.D.       Clinical Neuropsychologist       Billing Code/Service: 15056, [phone]  Chief Complaint:    [name] Poe. Ayrton Mcvay. is a 58 year old male who was referred by [facility] for neuropsychological/psychological consultation.  The patient has a prior history of anxiety going back 20 or 30 years with treatment utilizing benzodiazepine.  He is also had colon surgery in 2001 and cervical spine surgery with fusion in 2014.  The patient had surgery in 2018 on his kidney and there were extensive complications that resulted in the patient spending 3 months sedated and unconscious in the hospital and then 1 month rehabilitation before he was discharged home.  Within 2 days of going home and a significant reduction in the medications that he was given the patient developed a severe full-blown panic attack that is left longstanding residual effects on him with recurrent panic attacks and a great deal of anxiety in anticipation of the possibility of other panic attacks.  Reason for Service:  [name] Caldron. Oziah Vitanza. is a 58 year old male who was referred by [facility] for neuropsychological/psychological consultation.  The patient has a prior history of anxiety going back 20 or 30 years with treatment utilizing benzodiazepine.  He is also had  colon surgery in 2001 and cervical spine surgery with fusion in 2014.  The patient had surgery in 2018 on his kidney and there were extensive complications that resulted in the patient spending 3 months sedated and unconscious in the hospital and then 1 month rehabilitation before he was discharged home.  Within 2 days of going home and a significant reduction in the medications that he was given the patient developed a severe full-blown panic attack that is left longstanding residual effects on him with recurrent panic attacks and a great deal of anxiety in anticipation of the possibility of other panic attacks.  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  The patient also was on a ventilator for 2 to 3 weeks and the rest the time he was sedated and has no memory of his hospitalization for the 3 months until he woke up.  The patient spent another month after he came to before he was discharged.  The patient reports that after awakening his wife tried to explain what it happened.  The patient has residual scars and surgical sites all over his stomach.  The patient reports that all of this took a toll and when he got out of the hospital in October of that year he went home in 2 days after he went home he had a severe panic attack.  They had raised his benzodiazepine and other pain medications while in the hospital and his medications were put back to his pre-level of medications.  The patient reports that he constantly thinks of the events where this panic attack left him thinking that he was in imminent danger of dying.  The patient talks of the trauma of that day and how it is "still in his head".  The patient reports that he will tremble whenever he thinks about that it might happen again.  The patient does report some panic attacks continuing since then.   The patient reports that this initial severe panic attack lasted 45 minutes to 1 hour and that he simply "lost control."  The patient talks of this trauma continuing to set a great baseline of fear for the patient and that he has a constant fear that this could happen again at any time.  The patient reports that he had anxiety going back to his early 40s but this is nothing like he had before.  The patient describes disturbed sleep.  He reports that he will go to bed around 11:30 PM and he will usually fall asleep around 1230 or 1 AM.  The patient reports that he often wakes up at 5 or 6 AM.  The patient reports that he has a good diet and appetite most of the time and he eats about twice a day.  The patient reports that he has lost more than 50 pounds since before his surgery.  The patient reports that he does have some increased attentional problems and forgetfulness versus his premorbid functioning.  Current Status:  The patient describes significant anxiety and panic attacks and constant fear of having a severe full-blown panic attack again.  Reliability of Information: The information is derived from 1 hour face-to-face clinical interview as well as review of available medical records.  Behavioral Observation: [name] Vert Sr.  presents as a 58 y.o.-year-old Right African American Male who appeared his stated age. his dress was Appropriate and he was Well Groomed and his manners were Appropriate to the situation.  his participation was indicative of Appropriate and Redirectable behaviors.  There were not any physical disabilities noted.  he displayed an appropriate level of cooperation and motivation.     Interactions:    Active Appropriate and Redirectable  Attention:   abnormal and attention span appeared shorter than expected for age  Memory:   abnormal; remote memory intact, recent memory impaired  Visuo-spatial:  not examined  Speech (Volume):  normal  Speech:   normal;  normal  Thought Process:  Coherent and Relevant  Though Content:  WNL; not suicidal and not homicidal  Orientation:   person, place, time/date and situation  Judgment:   Good  Planning:   Fair  Affect:    Anxious  Mood:    Anxious  Insight:   Fair  Intelligence:   normal  Marital Status/Living: The patient reports that he was born and raised in [place] and was 1 of 6 siblings.  The patient currently lives with his wife, 2 sons age 33 and 12.  He has been married for 25 years married in 1995.  Current Employment: The patient is disabled.  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  Hobbies and interests include walking, watching TV, and working with [employer], [employer].  Substance Use:  While the patient has no history of street drugs or other substance abuse he has a long  history of Lorazepam and then oxycodone use.  The patient for started taking lorazepam almost 30 years ago for his anxiety disorder and he felt that it managed it very well for many years.  However, with all of the surgery that he had particularly around his cervical surgery and then this most recent surgery oxycodone was utilized.  The patient has had difficulty with withdrawals whenever these medications were changed significantly and a history of significant panic attack at those times.  Education:   The patient attended [employer] and completed the 11th grade.  Medical History:   Past Medical History:  Diagnosis Date  . Anemia   . Anxiety   . Arthritis    neck, shoulder, left knee  . 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 Erythrocytosis 11/30/2014  . Essential hypertension   . GERD (gastroesophageal reflux disease)   . Headache(784.0)    2 x/week  . History of kidney stones   . Hypothyroidism    hx of   . Leukocytosis, unspecified 11/03/2013  . Limited joint range of motion    cervical fusion 02/2013  . Medial meniscus tear 06/2013   left knee  .  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 Throat and mouth symptom    itchy throat x 3 weeks  . Thyroid nodule   . Tobacco abuse   . Wears partial dentures    upper        Abuse/Trauma History: While the patient has not been the victim of abusive situations he has had significant traumatic experiences.  His first colon surgery in 2001 resulted in a 1 month hospitalization with complications.  The patient had a long recovery from his cervical injuries and surgery but did do well there.  The patient had significant traumatic experiences after going in for a surgical procedure on his kidney that became complicated with adverse outcome resulting in the patient being hospitalized for 4 months with 3 months of that either sedated to unconsciousness and weeks of ventilator to 1 month after that trying to regain strength and deal with issues of debility.  Psychiatric History:  The patient has a long history of anxiety disorder that goes back to his 16s.  However, he denied any prior panic attacks until the second day after his discharge from his surgery and hospitalization in 2018.  Family Med/Psych History:  Family History  Problem Relation Age of Onset  . Cancer Father 62       colon ca - deceased.  . Colon cancer Father   . Cancer Sister 48       breast ca  . Thyroid disease Neg Hx     Risk of Suicide/Violence: low the patient denies any suicidal or homicidal ideation.  Impression/DX:  [name] Poe. Justn Quale. is a 58 year old male who was referred by [facility] for neuropsychological/psychological consultation.  The patient has a prior history of anxiety going back 20 or 30 years with treatment utilizing benzodiazepine.  He is also had colon surgery in 2001 and cervical spine surgery with fusion in 2014.  The patient had surgery in 2018 on his kidney and there were extensive complications that resulted in the patient spending 3 months sedated and unconscious  in the hospital and then 1 month rehabilitation before he was discharged home.  Within 2 days of going home and a significant reduction in the medications that he was given the patient developed a severe full-blown panic attack that is  left longstanding residual effects on him with recurrent panic attacks and a great deal of anxiety in anticipation of the possibility of other panic attacks.   Disposition/Plan:  We have set the patient up for psychotherapeutic interventions as I do think that the cognitive and memory issues that he is having are directly related to his anxiety although his extended hospitalization and being placed on the ventilator may have played some role in attention and memory issues.  I do think that the patient also needs a referral for a psychiatrist.  I have talked to the patient about him seeing Dr. Adele Schilder with [facility] and if that appointment is not available he needs to see a psychiatrist to some degree to manage some of his medications that he is developed either dependence upon or needs to manage his panic disorder.  The patient has severe panic disorders that are disabling for him.  The patient has had long-term benzodiazepine use as well as more recently opiate medications for his pain symptoms.  The patient has been tried on a number of SSRIs and did not particularly have a good response.  However, the complexity of his medical status and his psychiatric/psychopharmacological status would warrant having an expert psychiatric opinion and referral.  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

## 2019-03-23 DIAGNOSIS — F5101 Primary insomnia: Secondary | ICD-10-CM | POA: Diagnosis not present

## 2019-03-23 DIAGNOSIS — F410 Panic disorder [episodic paroxysmal anxiety] without agoraphobia: Secondary | ICD-10-CM | POA: Diagnosis not present

## 2019-03-23 DIAGNOSIS — Z0001 Encounter for general adult medical examination with abnormal findings: Secondary | ICD-10-CM | POA: Diagnosis not present

## 2019-03-23 DIAGNOSIS — E7800 Pure hypercholesterolemia, unspecified: Secondary | ICD-10-CM | POA: Diagnosis not present

## 2019-03-23 DIAGNOSIS — I10 Essential (primary) hypertension: Secondary | ICD-10-CM | POA: Diagnosis not present

## 2019-03-23 DIAGNOSIS — N529 Male erectile dysfunction, unspecified: Secondary | ICD-10-CM | POA: Diagnosis not present

## 2019-03-23 DIAGNOSIS — F411 Generalized anxiety disorder: Secondary | ICD-10-CM | POA: Diagnosis not present

## 2019-04-01 DIAGNOSIS — M25511 Pain in right shoulder: Secondary | ICD-10-CM | POA: Diagnosis not present

## 2019-04-01 DIAGNOSIS — M4722 Other spondylosis with radiculopathy, cervical region: Secondary | ICD-10-CM | POA: Diagnosis not present

## 2019-04-05 DIAGNOSIS — M1711 Unilateral primary osteoarthritis, right knee: Secondary | ICD-10-CM | POA: Diagnosis not present

## 2019-04-27 ENCOUNTER — Ambulatory Visit (HOSPITAL_COMMUNITY): Payer: Self-pay | Admitting: Psychiatry

## 2019-05-10 DIAGNOSIS — M170 Bilateral primary osteoarthritis of knee: Secondary | ICD-10-CM | POA: Diagnosis not present

## 2019-05-10 DIAGNOSIS — M1711 Unilateral primary osteoarthritis, right knee: Secondary | ICD-10-CM | POA: Diagnosis not present

## 2019-05-10 DIAGNOSIS — M1712 Unilateral primary osteoarthritis, left knee: Secondary | ICD-10-CM | POA: Diagnosis not present

## 2019-05-12 IMAGING — DX DG CHEST 1V PORT
1 series · 1 of 1 positions shown · non-contrast
Comparison: March 05, 2015

CLINICAL DATA: Rapid respiration. Abdominal distension and
shortness of breath. Laparoscopic adrenalectomy and small bowel
resection with reanastomosis May 06, 2017.

EXAM:
PORTABLE CHEST 1 VIEW

[chest ap]
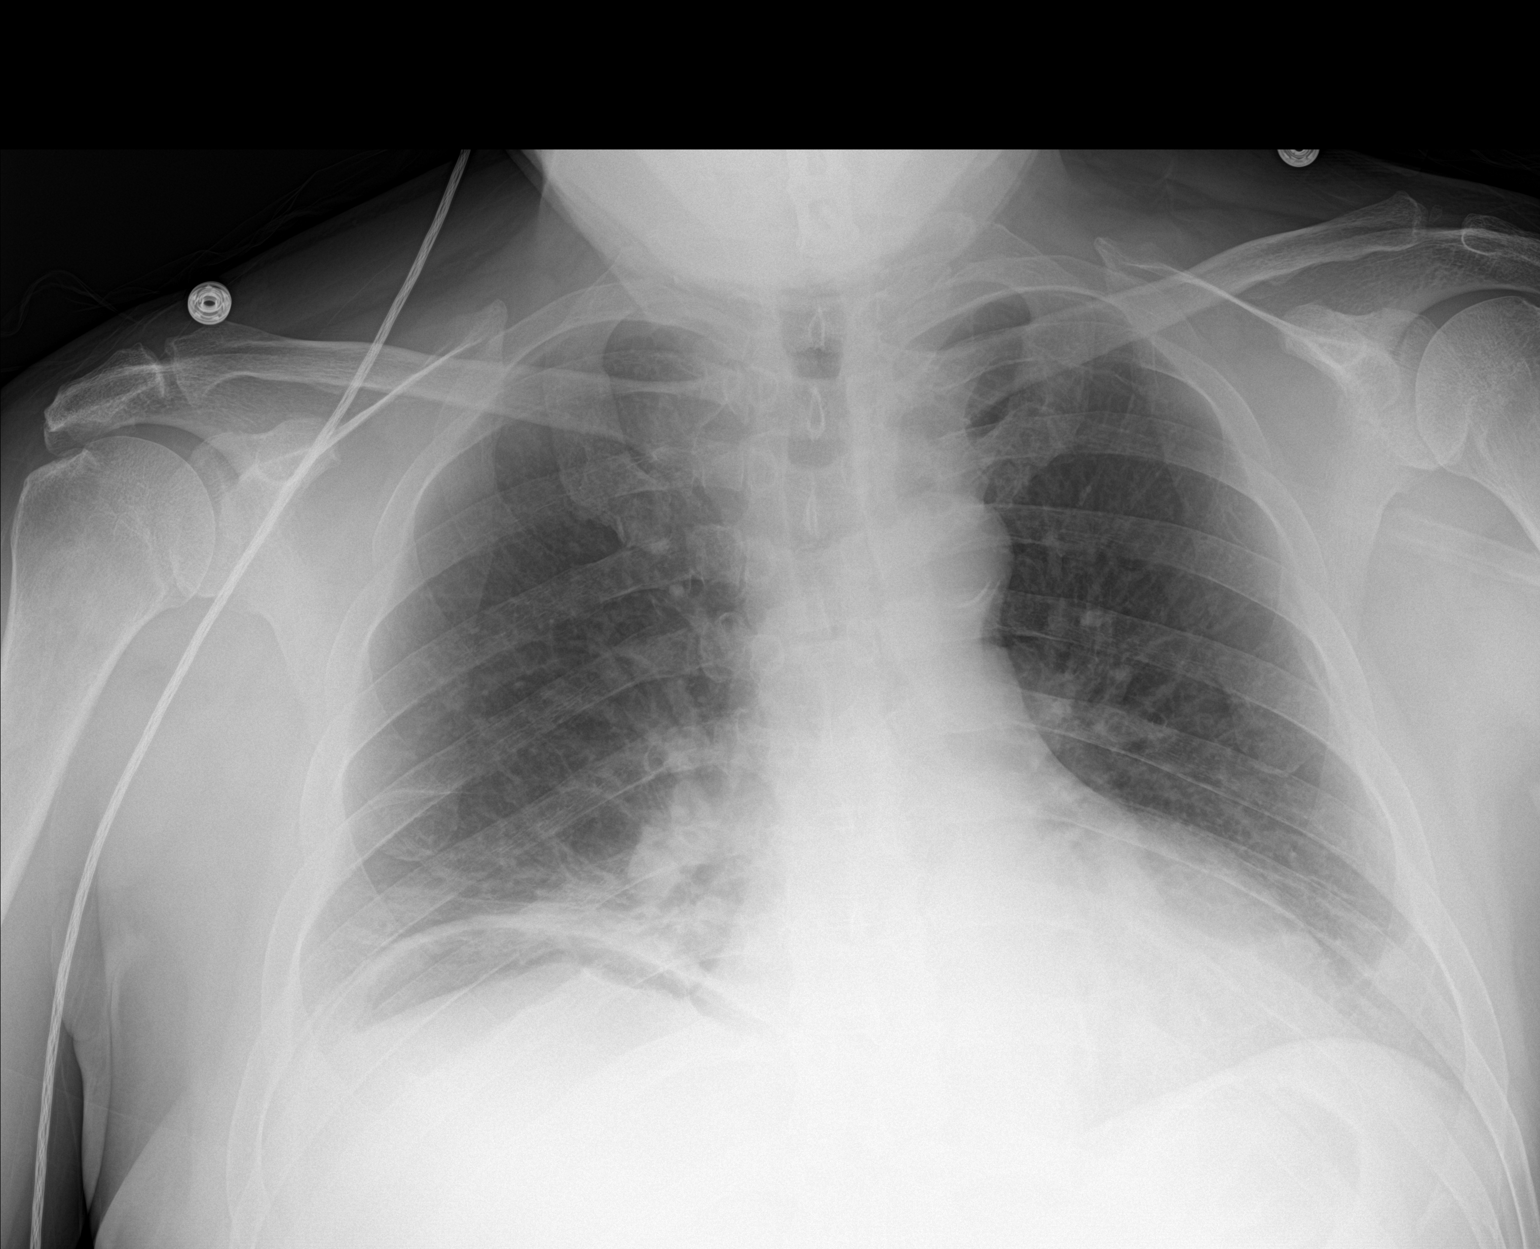

[1 of 1 positions shown; findings below may reference images not displayed]

FINDINGS: Free air seen under the right hemidiaphragm consistent with surgery
yesterday. No pneumothorax. The cardiomediastinal silhouette is
normal. Bibasilar opacities are likely atelectasis.
IMPRESSION: 1. Free air under the right hemidiaphragm consistent with surgery
yesterday.
2. Bibasilar opacities, likely atelectasis.

## 2019-05-16 IMAGING — DX DG ABDOMEN ACUTE W/ 1V CHEST
5 series · 5 of 5 positions shown · non-contrast
Comparison: Chest x-ray from 2 days pper

CLINICAL DATA: Shortness of breath and abdominal pain

EXAM:
DG ABDOMEN ACUTE W/ 1V CHEST

[abdomen supine (1 of 2)]
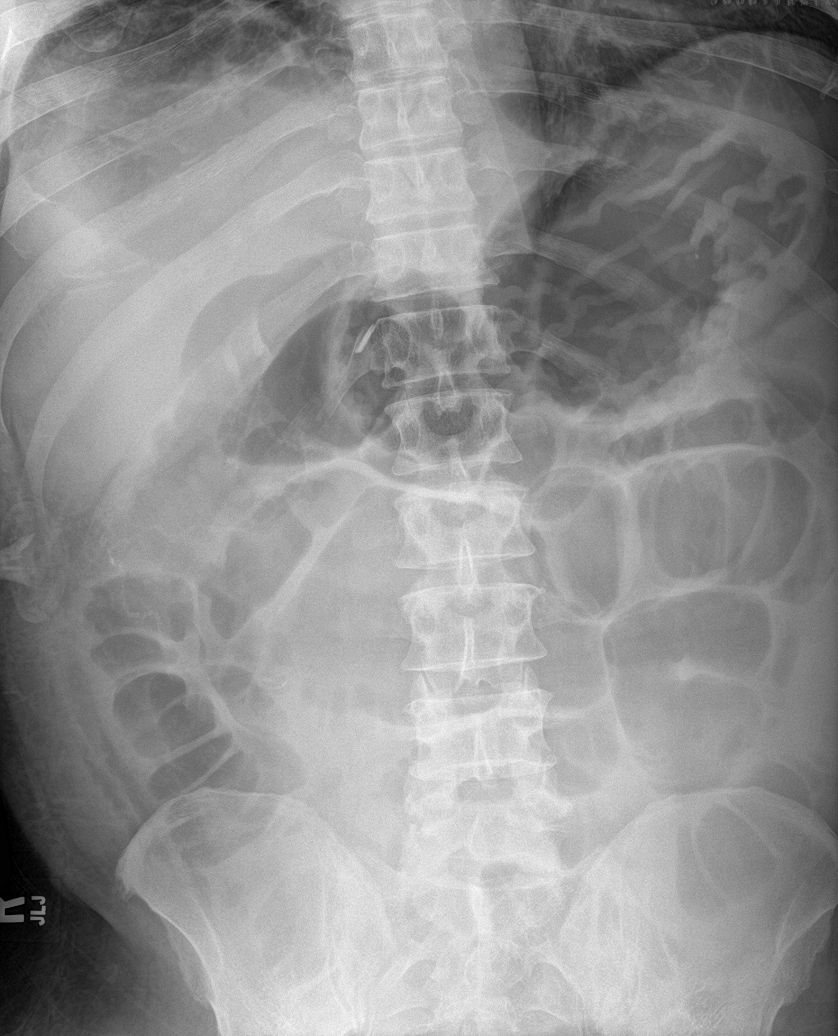

[abdomen supine (2 of 2)]
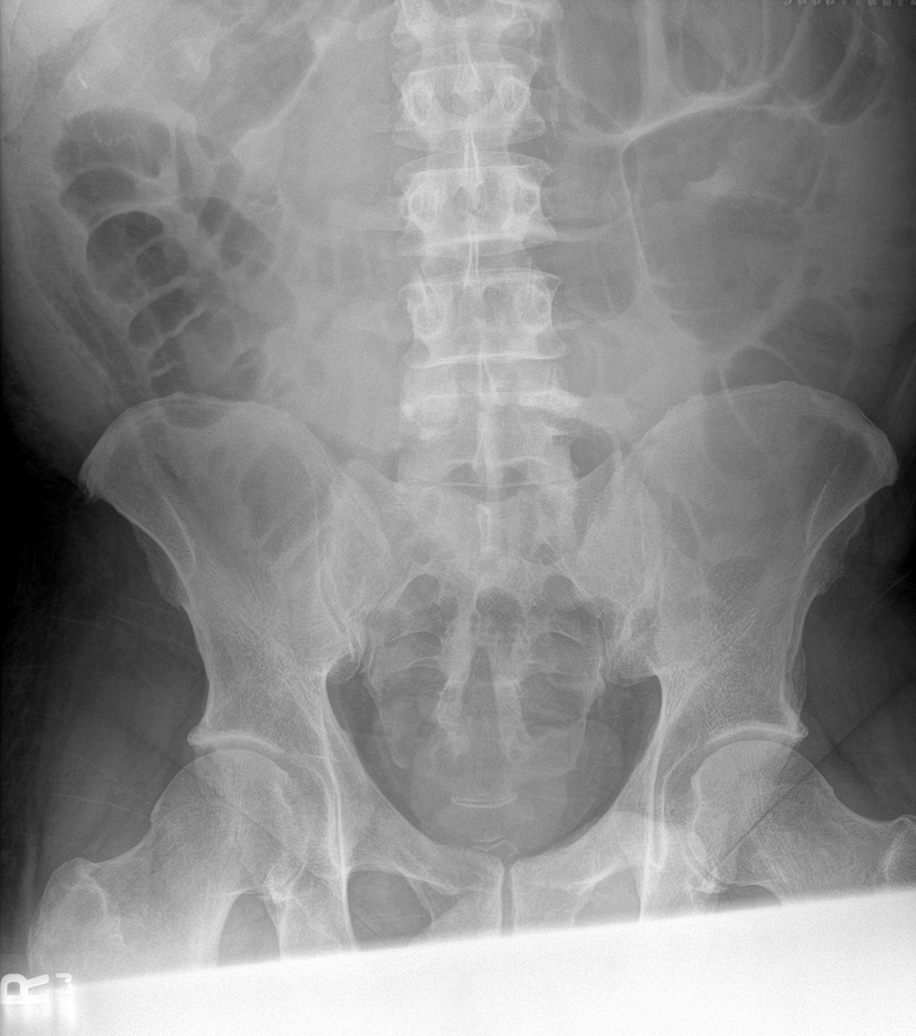

[abdomen decu (1 of 2)]
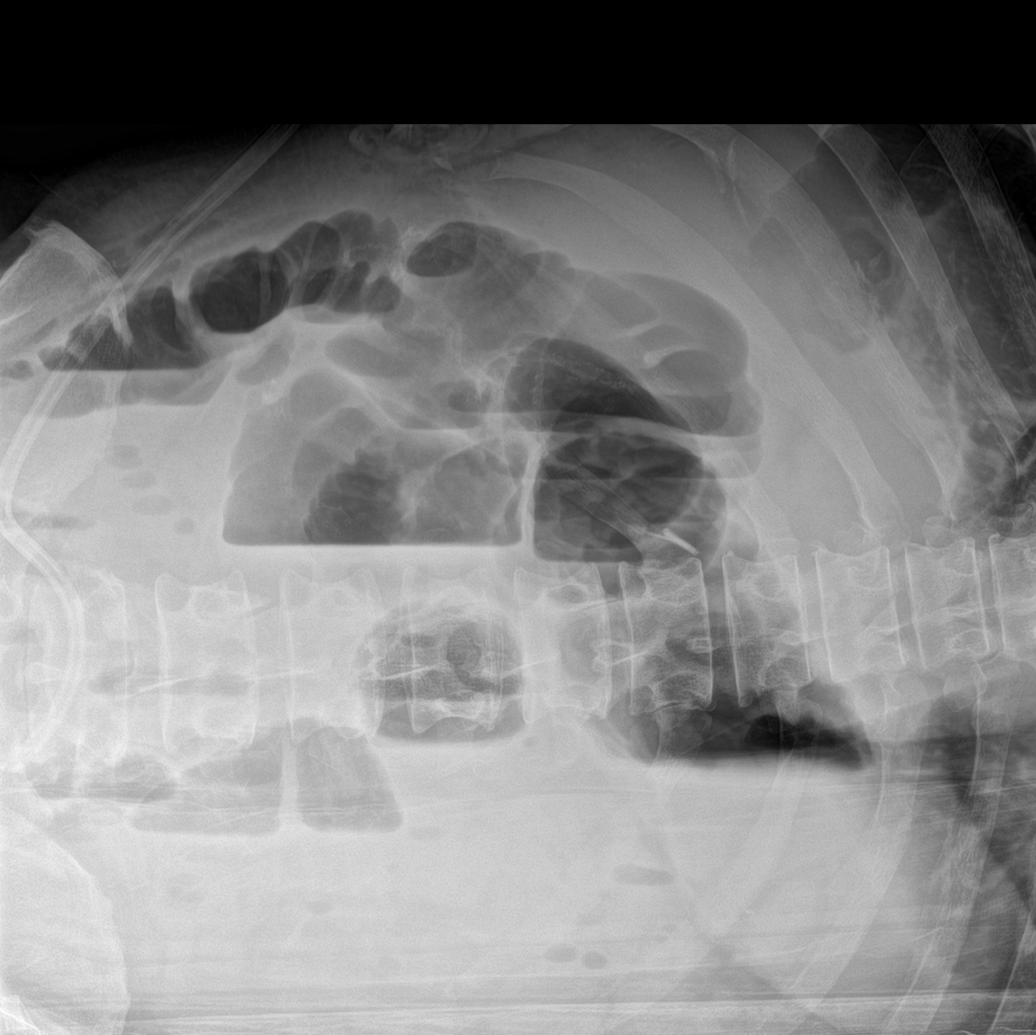

[chest ap]
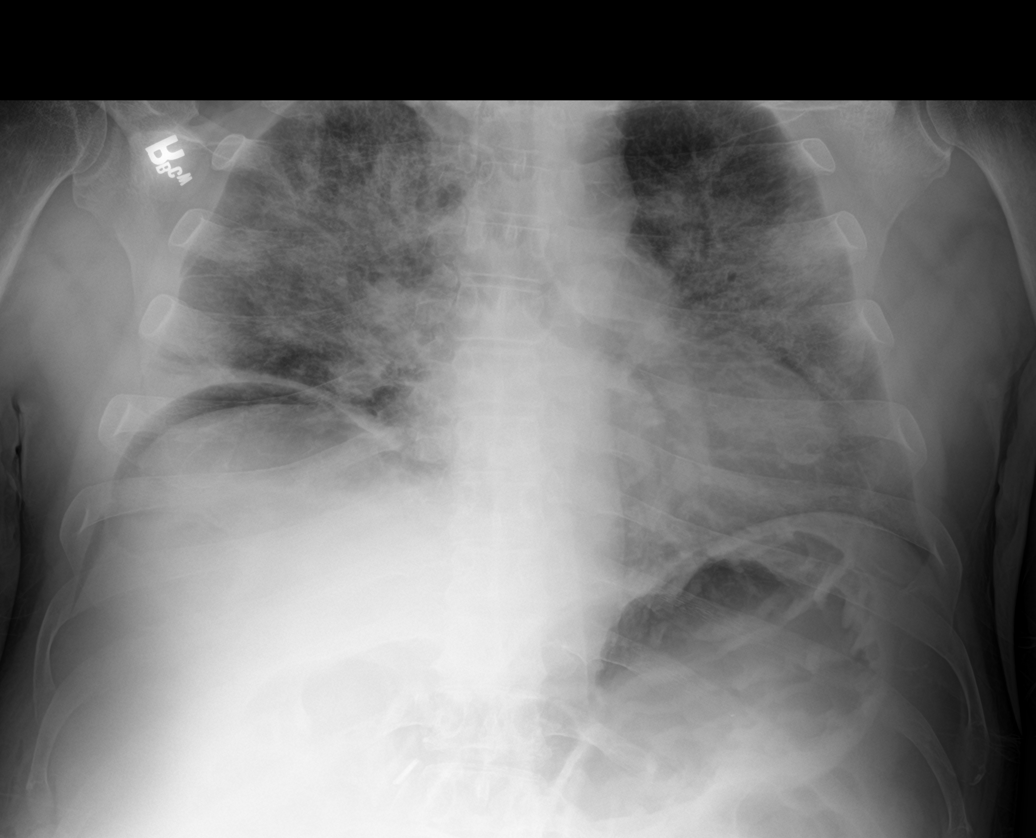

[abdomen decu (2 of 2)]
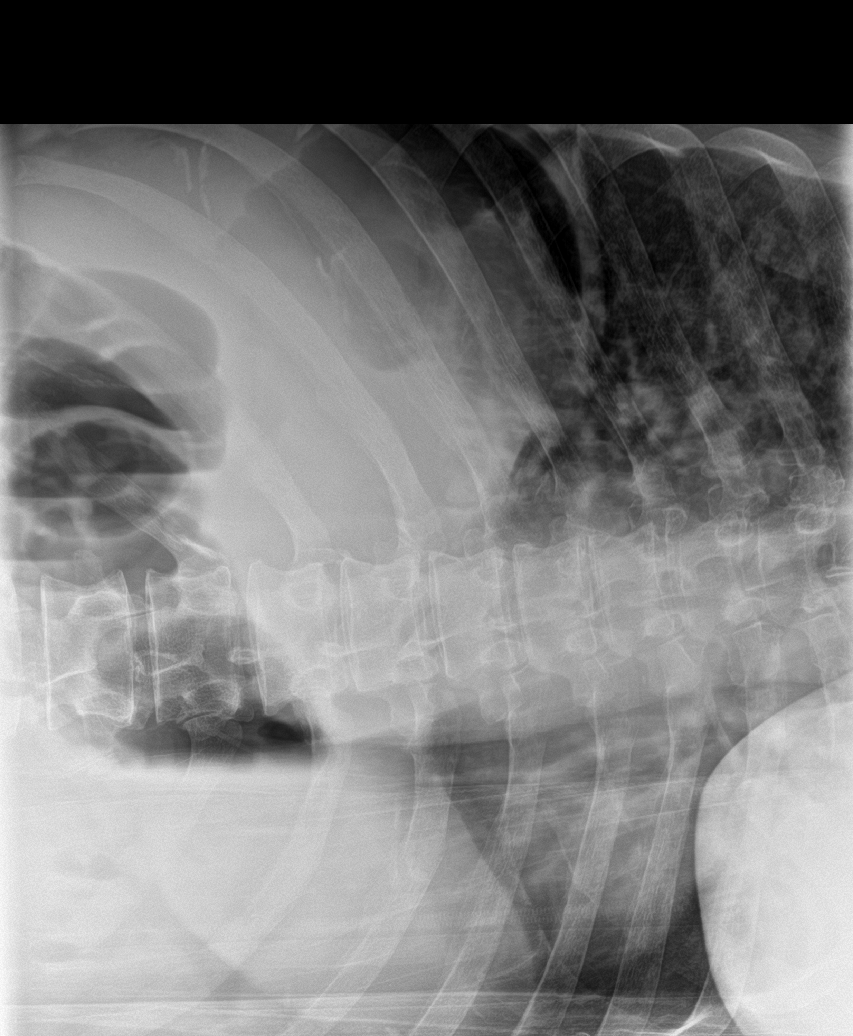

[5 of 5 positions shown; findings below may reference images not displayed]

FINDINGS: Progressive diffuse airspace opacity. Lung volumes are low.
Prominent heart size, accentuated by technique. No pneumothorax.

Recent abdominal surgery with similar appearing pneumoperitoneum.
Diffuse colonic, gastric, and small bowel dilatation consistent with
an ileus in this setting. Surgical clips and bowel sutures.
IMPRESSION: 1. Progressive bilateral airspace disease which could be edema
(likely noncardiogenic), infection, or aspiration.
2. Ileus pattern.

## 2019-05-17 ENCOUNTER — Encounter: Payer: Medicare HMO | Admitting: Psychology

## 2019-05-17 DIAGNOSIS — M1711 Unilateral primary osteoarthritis, right knee: Secondary | ICD-10-CM | POA: Diagnosis not present

## 2019-05-24 DIAGNOSIS — M1711 Unilateral primary osteoarthritis, right knee: Secondary | ICD-10-CM | POA: Diagnosis not present

## 2019-05-26 ENCOUNTER — Other Ambulatory Visit: Payer: Self-pay

## 2019-05-26 ENCOUNTER — Encounter (HOSPITAL_COMMUNITY): Payer: Self-pay | Admitting: Psychiatry

## 2019-05-26 ENCOUNTER — Ambulatory Visit (INDEPENDENT_AMBULATORY_CARE_PROVIDER_SITE_OTHER): Payer: Self-pay | Admitting: Psychiatry

## 2019-05-26 DIAGNOSIS — F411 Generalized anxiety disorder: Secondary | ICD-10-CM

## 2019-05-26 DIAGNOSIS — F410 Panic disorder [episodic paroxysmal anxiety] without agoraphobia: Secondary | ICD-10-CM

## 2019-05-26 NOTE — Progress Notes (Signed)
Virtual Visit via Video Note  I connected with [name] Mccarley Sr. on 05/26/19 at 11:00 AM EDT by a video enabled telemedicine application and verified that I am speaking with the correct person using two identifiers.   I discussed the limitations of evaluation and management by telemedicine and the availability of in person appointments. The patient expressed understanding and agreed to proceed.  History of Present Illness:   [name] [name] is a 58 year old married unemployment man who is referred from Dr. Sima Matas for medication management.  Patient struggle with anxiety for past 25 years.  He was seen primary care physician and prescribed benzodiazepine since then.  He has a difficult year in 2018 when he had surgery on his kidney and there were extensive complication and he was in the hospital for 3 months unconscious and sedated.  When he wake up he remembered that he was on ventilator.  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  Then he had 1 month rehabilitation.  In the hospital he was given benzodiazepine but he do not recall the hospital course very well.  Since discharge from the hospital he admitted his anxiety has been worse.  He is very afraid of his general health.  He feared that his symptoms may come back again.  He had a good support from his wife but he has to adjust to his new life style.  He endorsed that he is anxiety as excessive sweating, shortness of breath, palpitation and fear of dying.  Currently he is taking lorazepam 2 mg twice a day.  He is also taking trazodone to help her sleep.  He was referred to see Dr. Sima Matas for therapy who recommended to see psychiatrist for his medication management.  Patient denies any paranoia, hallucination, suicidal thoughts.  He denies any aggression or violence but admitted he was feeling very sad and depressed soon after the surgery but he overcome with his depression slowly and gradually.  His biggest concern  is anxiety.  Though he feels that current dose of Ativan is working very well for him but he is also afraid that he is dependent on lorazepam.  Recall that he had tried multiple antidepressant from his primary care physician but most of the time he had tremors and some of them caused sexual side effects.  He recall taking the Lamictal with because tremors, and gabapentin, hydroxyzine, Remeron and Cymbalta did not work.  He also tried Paxil Prozac and Zoloft long time ago but believe they did not work either.  Patient denies any mania or any psychosis.  He reported some time nightmares and flashback about his hospital stay but trazodone helps his sleep.  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  He sleeps good with the trazodone.  He is concerned about his recurrent panic attacks and anticipation of panic attacks and worsening of general health.  He lives with his wife and 2 young children.  His energy level is fair.  He is on disability for more than 10 years due to his neck surgery and anxiety.  He has never seen psychiatrist before.  He is open to try a different medication.  Past psychiatric history; History of anxiety since his early 33s.  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  No history of mania, psychosis, suicidal attempt, inpatient treatment.  Seen and managed his anxiety  by primary care physician since early 59s and member prescribed Remeron, hydroxyzine, gabapentin, Neurontin, Paxil, Prozac, Zoloft and Lamictal.  Most of the time here he had tremors or sexual side effects.  Medical history Patient has hypertension, chronic pain, multinodular goiter, knee pain, multiple surgeries for his neck and abdomen.  His primary care physician is Dr. Hiram Comber only at [facility].  Psychosocial history; Patient born and raised in [place].  His father died many years ago.  He has a close  contact with his mother who lives close by.  He has been married for 25 years.  His wife is very supportive.  He is on disability due to his neck pain and surgery.  Used to work as a Administrator.  He finished GED.   No results found for this or any previous visit (from the past 2160 hour(s)).  Psychiatric Specialty Exam: Physical Exam  ROS  There were no vitals taken for this visit.There is no height or weight on file to calculate BMI.  General Appearance: Casual  Eye Contact:  Good  Speech:  Clear and Coherent and Slow  Volume:  Decreased  Mood:  Anxious and Dysphoric  Affect:  Constricted  Thought Process:  Goal Directed  Orientation:  Full (Time, Place, and Person)  Thought Content:  Rumination  Suicidal Thoughts:  No  Homicidal Thoughts:  No  Memory:  Immediate;   Fair Recent;   Fair Remote;   Fair  Judgement:  Fair  Insight:  Present  Psychomotor Activity:  Decreased  Concentration:  Concentration: Fair and Attention Span: Fair  Recall:  [employer] of Knowledge:  Fair  Language:  Good  Akathisia:  No  Handed:  Right  AIMS (if indicated):     Assets:  Communication Skills Desire for Improvement Housing Resilience  ADL's:  Intact  Cognition:  Impaired,  Mild  Sleep:         Assessment and Plan: Generalized anxiety disorder.  Panic attacks.  Rule out posttraumatic stress disorder.  I reviewed his history, current medication, blood work and psychosocial information.  Patient is experiencing increased anxiety after the surgery in 2018.  Currently he is taking lorazepam 2 mg twice a day and trazodone 75-100 mg at bedtime.  Though he feels his anxiety is better but we talked about higher dose of benzodiazepine dependence and his tolerance.  He is willing to try a different medication so he can slowly wean himself off from the benzodiazepine.  He recall taking a lot of medication which either have side effects or did not work.  I recommend to have GeneSight testing before  prescribe any medication.  He has not recall taking Trintellix or Viibryd.  We will consider Trintellix if GeneSight testing shows that it is favorable to take it.  In the meantime he will continue his lorazepam 2 mg twice a day and trazodone from his primary care physician.  I also encouraged to see therapist Dr. Caroline Sauger for coping skills.  Discuss safety concern that anytime having active suicidal thoughts or homicidal thought that he need to call 911 or go to local emergency room.  Follow-up in 4 weeks.  Follow Up Instructions:    I discussed the assessment and treatment plan with the patient. The patient was provided an opportunity to ask questions and all were answered. The patient agreed with the plan and demonstrated an understanding of the instructions.   The patient was advised to call back or seek an in-person evaluation  if the symptoms worsen or if the condition fails to improve as anticipated.  I provided 50 minutes of non-face-to-face time during this encounter.   [name], MD

## 2019-05-27 DIAGNOSIS — F331 Major depressive disorder, recurrent, moderate: Secondary | ICD-10-CM | POA: Diagnosis not present

## 2019-06-03 ENCOUNTER — Emergency Department (HOSPITAL_COMMUNITY)
Admission: EM | Admit: 2019-06-03 | Discharge: 2019-06-03 | Disposition: A | Discharge status: Home / Self Care | Payer: Medicare HMO | Attending: Emergency Medicine | Admitting: Emergency Medicine

## 2019-06-03 ENCOUNTER — Encounter (HOSPITAL_COMMUNITY): Payer: Self-pay | Admitting: Emergency Medicine

## 2019-06-03 ENCOUNTER — Other Ambulatory Visit: Payer: Self-pay

## 2019-06-03 ENCOUNTER — Emergency Department (HOSPITAL_COMMUNITY): Payer: Medicare HMO

## 2019-06-03 DIAGNOSIS — Y93K3 Activity, grooming and shearing an animal: Secondary | ICD-10-CM | POA: Insufficient documentation

## 2019-06-03 DIAGNOSIS — M545 Low back pain, unspecified: Secondary | ICD-10-CM

## 2019-06-03 DIAGNOSIS — S3992XA Unspecified injury of lower back, initial encounter: Secondary | ICD-10-CM | POA: Diagnosis present

## 2019-06-03 DIAGNOSIS — I7090 Unspecified atherosclerosis: Secondary | ICD-10-CM | POA: Insufficient documentation

## 2019-06-03 DIAGNOSIS — Y92019 Unspecified place in single-family (private) house as the place of occurrence of the external cause: Secondary | ICD-10-CM | POA: Insufficient documentation

## 2019-06-03 DIAGNOSIS — G8929 Other chronic pain: Secondary | ICD-10-CM | POA: Diagnosis not present

## 2019-06-03 DIAGNOSIS — E039 Hypothyroidism, unspecified: Secondary | ICD-10-CM | POA: Insufficient documentation

## 2019-06-03 DIAGNOSIS — I10 Essential (primary) hypertension: Secondary | ICD-10-CM | POA: Diagnosis not present

## 2019-06-03 DIAGNOSIS — X501XXA Overexertion from prolonged static or awkward postures, initial encounter: Secondary | ICD-10-CM | POA: Diagnosis not present

## 2019-06-03 DIAGNOSIS — S39012A Strain of muscle, fascia and tendon of lower back, initial encounter: Secondary | ICD-10-CM | POA: Diagnosis not present

## 2019-06-03 DIAGNOSIS — Y999 Unspecified external cause status: Secondary | ICD-10-CM | POA: Diagnosis not present

## 2019-06-03 DIAGNOSIS — M47817 Spondylosis without myelopathy or radiculopathy, lumbosacral region: Secondary | ICD-10-CM | POA: Insufficient documentation

## 2019-06-03 DIAGNOSIS — Z79899 Other long term (current) drug therapy: Secondary | ICD-10-CM | POA: Insufficient documentation

## 2019-06-03 DIAGNOSIS — M1990 Unspecified osteoarthritis, unspecified site: Secondary | ICD-10-CM | POA: Diagnosis not present

## 2019-06-03 DIAGNOSIS — F17210 Nicotine dependence, cigarettes, uncomplicated: Secondary | ICD-10-CM | POA: Diagnosis not present

## 2019-06-03 MED ORDER — METHYLPREDNISOLONE 4 MG PO TBPK: ORAL_TABLET | ORAL | 0 refills | Status: DC

## 2019-06-03 MED ORDER — FENTANYL CITRATE (PF) 100 MCG/2ML IJ SOLN
50.0000 ug | Freq: Once | INTRAMUSCULAR | Status: AC
  Administered 2019-06-03: 50 ug via INTRAVENOUS
  Filled 2019-06-03: qty 2

## 2019-06-03 MED ORDER — CYCLOBENZAPRINE HCL 10 MG PO TABS: 5.0000 mg | ORAL_TABLET | Freq: Two times a day (BID) | ORAL | 0 refills | Status: DC | PRN

## 2019-06-03 NOTE — ED Triage Notes (Signed)
Patient to ED c/o worsening lower back pain x 2 days despite taking home pain medication - states he has chronic pain in lower back, but upon bending over 2 days ago, he states pain worsened and now moves down his R leg and to the R side of his groin. He denies numbness/tingling or incontinence. He reports he has an appointment Monday with pain management for injection, but pain too bad to make it to appt.

## 2019-06-03 NOTE — ED Provider Notes (Signed)
[facility] [place] [facility] EMERGENCY DEPARTMENT Provider Note   CSN: [id number] Arrival date & time: 06/03/19  1142    History   Chief Complaint Chief Complaint  Patient presents with  . Back Pain    HPI [name] Toscano Sr. is a 58 y.o. male who presents with c/o pain in his back. 2 days ago he washed his dog and had pain in right low back which has been progressively worsening over the past 2 days.  Now radiating down the right lateral thigh.  He states that the pain is severe.  He takes 3-4 10- 325 mg Percocet daily for chronic neck and back pain.  He has an appointment with his pain management specialist on Monday but states that he was having too much pain to make it to then.  He denies any new weakness numbness or tingling.  He denies any saddle anesthesia, loss of bowel or bladder control.     HPI  Past Medical History:  Diagnosis Date  . Anemia   . Anxiety   . Arthritis    neck, shoulder, left knee  . 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 Erythrocytosis 11/30/2014  . Essential hypertension   . GERD (gastroesophageal reflux disease)   . Headache(784.0)    2 x/week  . History of kidney stones   . Hypothyroidism    hx of   . Leukocytosis, unspecified 11/03/2013  . Limited joint range of motion    cervical fusion 02/2013  . Medial meniscus tear 06/2013   left knee  . 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 Throat and mouth symptom    itchy throat x 3 weeks  . Thyroid nodule   . Tobacco abuse   . Wears partial dentures    upper    Patient Active Problem List   Diagnosis Date Noted  . PVC (premature ventricular contraction)   . Anxiety state 08/14/2017  . Hypomagnesemia 06/28/2017  . Hypothyroidism   . 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 Protein-calorie malnutrition, severe ([place]) 06/27/2017  . 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 Pressure injury of  skin 05/29/2017  . Ileus ([place])   . Hypokalemia 05/14/2017  . Anastomotic leak of intestine 05/14/2017  . Wound dehiscence, surgical 05/14/2017  . Aspiration pneumonia ([place]) 05/14/2017  . Chronic narcotic use 05/10/2017  . Generalized anxiety disorder 05/10/2017  . Intra-abdominal adhesions s/p SB resection 05/06/2017 05/09/2017  . Throat symptom 03/18/2016  . Multinodular goiter 01/19/2016  . Hyperthyroidism 01/19/2016  . Essential hypertension   . Diarrhea 11/30/2014  . Anemia, iron deficiency 11/01/2014  . Leukocytosis 11/03/2013  . Tobacco abuse 07/26/2013  . COPD (chronic obstructive pulmonary disease) ([place]) 07/26/2013  . Left knee pain 07/26/2013  . Pain in joint, shoulder region 05/03/2013  . GERD 07/24/2008  . 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 ABDOMINAL SURGERY    . ANTERIOR CERVICAL DECOMP/DISCECTOMY FUSION N/A 03/18/2013   Procedure: ANTERIOR CERVICAL DECOMPRESSION/DISCECTOMY FUSION 3 LEVELS;  Surgeon: Erline Levine, MD;  Location: [facility];  Service: Neurosurgery;  Laterality: N/A;  Anterior Cervical Three-Six  Decompression/Diskectomy/Fusion  . APPENDECTOMY    . CIRCUMCISION  05/04/2006  . COLONOSCOPY    . CYSTOSCOPY/URETEROSCOPY/HOLMIUM LASER/STENT PLACEMENT Left 11/24/2018   Procedure: CYSTOSCOPY/URETEROSCOPY/HOLMIUM LASER/STENT PLACEMENT;  Surgeon: Lucas Mallow, MD;  Location: [facility];  Service: Urology;  Laterality: Left;  .  EYE SURGERY     bilateral cataract surgery   . HEMICOLECTOMY Right 0/86/7619   with ileocolic anastomosis  . INGUINAL HERNIA REPAIR    . INSERTION OF MESH N/A 07/31/2017   Procedure: INSERTION OF MESH;  Surgeon: Johnathan Hausen, MD;  Location: [facility];  Service: General;  Laterality: N/A;  . KNEE ARTHROSCOPY Left 08/01/2013   Procedure: LEFT ARTHROSCOPY KNEE;  Surgeon: Kerin Salen, MD;  Location: [facility];  Service: Orthopedics;   Laterality: Left;  . LAPAROSCOPIC ADRENALECTOMY Right 05/06/2017   Procedure: OPEN RIGHT ADRENALECTOMY WITH SMALL BOWEL RESECTION AND ANASTOMOSIS;  Surgeon: Kieth Brightly, Arta Bruce, MD;  Location: [facility];  Service: General;  Laterality: Right;  . LAPAROSCOPIC LYSIS OF ADHESIONS N/A 07/31/2017   Procedure: LYSIS OF ADHESIONS;  Surgeon: Johnathan Hausen, MD;  Location: [facility];  Service: General;  Laterality: N/A;  . LAPAROSCOPY N/A 07/31/2017   Procedure: LAPAROSCOPY DIAGNOSTIC;  Surgeon: Johnathan Hausen, MD;  Location: [facility];  Service: General;  Laterality: N/A;  . LAPAROTOMY N/A 05/13/2017   Procedure: EXPLORATORY LAPAROTOMY, with wound dehisense, and repair of anastamotic leak;  Surgeon: Mickeal Skinner, MD;  Location: [facility];  Service: General;  Laterality: N/A;  . LAPAROTOMY N/A 05/14/2017   Procedure: EXPLORATORY LAPAROTOMY, LYSIS OF ADHESIONS/ SMALL BOWEL RESECTION/ WASHOUT OF ABDOMEN AND PLACEMENT OF NEGATIVE PRESSURE DRESSING;  Surgeon: Kieth Brightly, Arta Bruce, MD;  Location: [facility];  Service: General;  Laterality: N/A;  . LAPAROTOMY N/A 05/17/2017   Procedure: EXPLORATORY LAPAROTOMY, PLACEMENT OF ILEOSTOMY TUBES, PARTIAL CLOSURE OF FASCIA, WOUND VAC EXCHANGE;  Surgeon: [name], Arta Bruce, MD;  Location: [facility];  Service: General;  Laterality: N/A;  . LAPAROTOMY N/A 05/20/2017   Procedure: ABDOMINAL WOUND [place] OUT, WOUND CLOSURE AND WOUND VAC PLACEMENT;  Surgeon: [name], Arta Bruce, MD;  Location: [facility];  Service: General;  Laterality: N/A;  . LAPAROTOMY N/A 07/31/2017   Procedure: EXPLORATORY LAPAROTOMY WITH CLOSURE OF ENTEROTOMICS;  Surgeon: Johnathan Hausen, MD;  Location: [facility];  Service: General;  Laterality: N/A;  . SHOULDER ARTHROSCOPY W/ ROTATOR CUFF REPAIR Left 07/16/2010  . SMALL INTESTINE SURGERY  04/24/2000   adhesiolysis, ileocolic anastomosis  . UPPER GASTROINTESTINAL ENDOSCOPY          Home Medications    Prior to Admission medications   Medication Sig Start Date End  Date Taking? Authorizing Provider  acetaminophen (TYLENOL) 500 MG tablet Take 1,000 mg by mouth every 6 (six) hours as needed for mild pain.    [provider]  amLODipine (NORVASC) 10 MG tablet  03/11/19   [provider]  baclofen (LIORESAL) 10 MG tablet Take 10 mg by mouth 3 (three) times daily.    [provider]  diclofenac sodium (VOLTAREN) 1 % GEL Apply 4 g 4 (four) times daily topically. Patient taking differently: Apply 4 g topically 4 (four) times daily as needed (pain).  09/04/17   Focht, Fraser Din, PA  ibuprofen (ADVIL,MOTRIN) 200 MG tablet Take 600 mg by mouth daily as needed for headache. Reported on 03/18/2016    [provider]  loratadine (CLARITIN) 10 MG tablet Take 10 mg by mouth daily.    [provider]  LORazepam (ATIVAN) 1 MG tablet Take 1 mg by mouth 2 (two) times daily as needed for anxiety.    [provider]  methylPREDNISolone (MEDROL DOSEPAK) 4 MG TBPK tablet Use as directed 06/03/19   Margarita Mail, PA-C  Multiple Vitamin (MULTIVITAMIN WITH MINERALS) TABS Take 1 tablet by mouth  daily.    [provider]  omeprazole (PRILOSEC) 40 MG capsule Take 40 mg by mouth at bedtime.     [provider]  oxyCODONE-acetaminophen (PERCOCET) 10-325 MG tablet Take 1 tablet by mouth 4 (four) times daily.    [provider]  sildenafil (VIAGRA) 100 MG tablet Take 100 mg by mouth daily as needed for erectile dysfunction.    [provider]  tamsulosin (FLOMAX) 0.4 MG CAPS capsule Take 1 capsule (0.4 mg total) by mouth daily. 11/24/18   Lucas Mallow, MD  telmisartan (MICARDIS) 80 MG tablet Take 80 mg by mouth at bedtime.    [provider]  traZODone (DESYREL) 100 MG tablet Take 100 mg by mouth at bedtime.    [provider]  simethicone (MYLICON) 80 MG chewable tablet Chew 80 mg by mouth every 6 (six) hours as needed for flatulence.  02/01/14  [provider]    Family  History Family History  Problem Relation Age of Onset  . Cancer Father 29       colon ca - deceased.  . Colon cancer Father   . Cancer Sister 68       breast ca  . Thyroid disease Neg Hx     Social History Social History   Tobacco Use  . Smoking status: Current Every Day Smoker    Packs/day: 1.00    Years: 25.00    Pack years: 25.00    Types: Cigarettes  . Smokeless tobacco: Never Used  Substance Use Topics  . Alcohol use: Yes    Comment: occasionally  . Drug use: No     Allergies   Chantix [varenicline], Morphine, and Penicillins   Review of Systems Review of Systems  Ten systems reviewed and are negative for acute change, except as noted in the HPI.   Physical Exam Updated Vital Signs BP 136/81 (BP Location: Right Arm)   Pulse 90   Temp 98 F (36.7 C) (Oral)   Resp 16   Ht 5' 10.5" (1.791 m)   Wt 87.5 kg   SpO2 98%   BMI 27.30 kg/m   Physical Exam Vitals signs and nursing note reviewed.  Constitutional:      General: He is not in acute distress.    Appearance: He is well-developed. He is not diaphoretic.  HENT:     Head: Normocephalic and atraumatic.  Eyes:     General: No scleral icterus.    Conjunctiva/sclera: Conjunctivae normal.  Neck:     Musculoskeletal: Normal range of motion and neck supple.  Cardiovascular:     Rate and Rhythm: Normal rate and regular rhythm.     Heart sounds: Normal heart sounds.  Pulmonary:     Effort: Pulmonary effort is normal. No respiratory distress.     Breath sounds: Normal breath sounds.  Abdominal:     Palpations: Abdomen is soft.     Tenderness: There is no abdominal tenderness.  Musculoskeletal:        General: Tenderness present.     Comments: No midline spinal tenderness.  Pain with flexion extension twisting and lateral flexion of the lumbar spine in all directions.  He has a positive straight leg test on the right at about 30 degrees.  Normal DTRs.  Normal pulse and sensation in the bilateral lower  extremities.  Skin:    General: Skin is warm and dry.  Neurological:     Mental Status: He is alert.  Psychiatric:  Behavior: Behavior normal.      ED Treatments / Results  Labs (all labs ordered are listed, but only abnormal results are displayed) Labs Reviewed - No data to display  EKG None  Radiology Dg Lumbar Spine Complete  Result Date: 06/03/2019 CLINICAL DATA:  Worsening lower back pain. EXAM: LUMBAR SPINE - COMPLETE 4+ VIEW COMPARISON:  None. FINDINGS: There is no evidence of lumbar spine fracture. Alignment is normal. Minimal multilevel osteoarthritic changes. Mild posterior facet arthropathy at L4-L5 and L5-S1. 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 IMPRESSION: 1. Minimal multilevel osteoarthritic changes. 2. 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 Electronically Signed   By: Fidela Salisbury M.D.   On: 06/03/2019 13:53    Procedures Procedures (including critical care time)  Medications Ordered in ED Medications  fentaNYL (SUBLIMAZE) injection 50 mcg (50 mcg Intravenous Given 06/03/19 1309)     Initial Impression / Assessment and Plan / ED Course  I have reviewed the triage vital signs and the nursing notes.  Pertinent labs & imaging results that were available during my care of the patient were reviewed by me and considered in my medical decision making (see chart for details).        Patient here with acute on chronic back pain.  I personally reviewed the patient's lumbar film which shows degenerative changes and advanced atherosclerosis.  He is a daily smoker.  He is advised to discontinue smoking.The patient was counseled on the dangers of tobacco use, and was advised to quit. Reviewed strategies to maximize success, including removing cigarettes and smoking materials from environment, stress management, substitution of other forms of reinforcement, support of family/friends and written materials. Unfortunately  patient did not have significant relief with the method of pain relief he requested specifically which was IV fentanyl.  He was given a single dose here.  I will have him with a Medrol Dosepak.  He has no red flag symptoms.  He is advised to keep his pain management follow-up.  Discussed return precautions.  Final Clinical Impressions(s) / ED Diagnoses   Final diagnoses:  Strain of lumbar region, initial encounter  Chronic bilateral low back pain without sciatica  Lumbar and sacral osteoarthritis  Atherosclerosis    ED Discharge Orders         Ordered    methylPREDNISolone (MEDROL DOSEPAK) 4 MG TBPK tablet     06/03/19 1424           Margarita Mail, PA-C 06/03/19 1426    Maudie Flakes, MD 06/06/19 [phone]

## 2019-06-03 NOTE — ED Notes (Signed)
Upon reviewing discharge instructions with patient, he reported he was still in pain and didn't even get what he needed by coming here. EDP made aware and with RN present, explained to patient in detail x-ray findings, discharge plan of care, prescription medication, and f/u information. Patient stated he wouldn't take the steroid because it makes him all jittery and that he would continue to take his pain medication as prescribed in addition to Flexeril. Patient denied further needs or questions afterward and ambulated steadily to ED entrance after refusing wheelchair.

## 2019-06-03 NOTE — Discharge Instructions (Addendum)
Your x-ray showed arthritis of the lumbar spine as well as atherosclerosis which is hardening of your arteries.  Both arterial hardening and chronic low back pain are directly related to chronic smoking.  You are advised to discontinue smoking cigarettes if you are currently smoking.  This may also help you avoid strokes and heart attack. These keep your appointment with your pain management specialist this coming Monday.  New numbness, tingling, weakness, or problem with the use of your arms or legs.  Loss of control of your bowel or bladder not secondary to pain and limited movement. Increasing pain in any areas of the body (such as chest or abdominal pain).  Shortness of breath, dizziness or fainting.  Nausea (feeling sick to your stomach), vomiting, fever, or sweats.

## 2019-06-07 DIAGNOSIS — M5416 Radiculopathy, lumbar region: Secondary | ICD-10-CM | POA: Diagnosis not present

## 2019-06-20 ENCOUNTER — Ambulatory Visit (INDEPENDENT_AMBULATORY_CARE_PROVIDER_SITE_OTHER): Payer: Medicare HMO | Admitting: Psychiatry

## 2019-06-20 ENCOUNTER — Encounter (HOSPITAL_COMMUNITY): Payer: Self-pay | Admitting: Psychiatry

## 2019-06-20 ENCOUNTER — Other Ambulatory Visit: Payer: Self-pay

## 2019-06-20 DIAGNOSIS — F410 Panic disorder [episodic paroxysmal anxiety] without agoraphobia: Secondary | ICD-10-CM

## 2019-06-20 DIAGNOSIS — F411 Generalized anxiety disorder: Secondary | ICD-10-CM

## 2019-06-20 MED ORDER — VORTIOXETINE HBR 10 MG PO TABS: 10.0000 mg | ORAL_TABLET | Freq: Every day | ORAL | 0 refills | Status: DC

## 2019-06-20 NOTE — Progress Notes (Signed)
Virtual Visit via Telephone Note  I connected with [name] [name]. on 06/20/19 at 11:40 AM EDT by telephone and verified that I am speaking with the correct person using two identifiers.   I discussed the limitations, risks, security and privacy concerns of performing an evaluation and management service by telephone and the availability of in person appointments. I also discussed with the patient that there may be a patient responsible charge related to this service. The patient expressed understanding and agreed to proceed.   History of Present Illness: Patient was evaluated by phone session.  He is a 58 year old who was referred from Dr. Sima Matas.  Patient struggled with anxiety for past 25 years.  He is taking lorazepam 2 mg twice a day and trazodone 75 mg prescribed by primary care physician.  In the past he had tried multiple medication but he did develop sexual side effects, tremors or did not work.  We have recommended GeneSight testing and I discussed the results with him today.  We have recommended to try Trintellix but he wanted to wait until results come back.  I explained that Trintellix is suitable medicine for him as per GeneSight testing.  Patient is willing to try the medication.  He is still like to continue lorazepam and trazodone from his primary care physician.  He admitted some stress but it is stable.  He is sleeping at least 6 hours at night.  He denies any agitation, anger, severe mood swing.  He has panic attacks as patient has gone through a lot of medical complication and afraid for his general health.  In 2018 he had a surgery on his kidney and there were extensive complication and he stayed in the hospital for 3 months unconscious and sedated.  He is very concerned about his general health.  Denies any major panic attack since the last visit but he is afraid cutting down the lorazepam may trigger the anxiety.  He like to try Trintellix since it is favorable in GeneSight  testing results.  He lives with his wife who is very supportive.  His energy level is fair.  He is on disability for more than 10 years due to his neck surgery and anxiety.     Past psychiatric history; H/O anxiety since early 27s.  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  No h/o mania, psychosis, suicidal attempt, inpatient treatment.  Seen and managed his anxiety by PCP since early 60s and took Remeron, hydroxyzine, gabapentin, Neurontin, Paxil, Prozac, Zoloft and Lamictal.  Most of the time he had tremors or sexual side effects.  Psychiatric Specialty Exam: Physical Exam  ROS  There were no vitals taken for this visit.There is no height or weight on file to calculate BMI.  General Appearance: NA  Eye Contact:  NA  Speech:  Clear and Coherent and Slow  Volume:  Decreased  Mood:  Anxious  Affect:  NA  Thought Process:  Descriptions of Associations: Intact  Orientation:  Full (Time, Place, and Person)  Thought Content:  Rumination  Suicidal Thoughts:  No  Homicidal Thoughts:  No  Memory:  Immediate;   Fair Recent;   Fair Remote;   Fair  Judgement:  Fair  Insight:  Fair  Psychomotor Activity:  NA  Concentration:  Concentration: Fair and Attention Span: Fair  Recall:  [place] of Knowledge:  Good  Language:  Good  Akathisia:  No  Handed:  Right  AIMS (if indicated):  Assets:  Communication Skills Desire for Improvement Housing Resilience Social Support  ADL's:  Intact  Cognition:  WNL  Sleep:   6 hrs     Assessment and Plan: Generalized anxiety disorder.  Panic attacks.  Discussed GeneSight testing results with him.  He is willing to try Trintellix.  We have provided samples to start with 5 mg for 10 days and then 10 mg.  He is afraid to take higher dose since he had a bad response with the previous medication.  We discussed in detail about the medication side effects and benefits.  He will continue lorazepam 2 mg twice a day and trazodone 75  mg from his primary care physician.  I also encouraged strongly that he should continue therapy with Dr. Sima Matas since his plan is to come off from the medication in the future.  Recommended to call us back if is any question or any concern.  Follow-up in 4 to 6 weeks.  Follow Up Instructions:    I discussed the assessment and treatment plan with the patient. The patient was provided an opportunity to ask questions and all were answered. The patient agreed with the plan and demonstrated an understanding of the instructions.   The patient was advised to call back or seek an in-person evaluation if the symptoms worsen or if the condition fails to improve as anticipated.  I provided 20 minutes of non-face-to-face time during this encounter.   [name], MD

## 2019-07-05 ENCOUNTER — Other Ambulatory Visit: Payer: Self-pay

## 2019-07-05 ENCOUNTER — Encounter: Payer: Medicare HMO | Attending: Psychology | Admitting: Psychology

## 2019-07-05 DIAGNOSIS — F4001 Agoraphobia with panic disorder: Secondary | ICD-10-CM

## 2019-07-05 DIAGNOSIS — F411 Generalized anxiety disorder: Secondary | ICD-10-CM

## 2019-07-07 ENCOUNTER — Encounter: Payer: Self-pay | Admitting: Psychology

## 2019-07-07 NOTE — Progress Notes (Signed)
Neuropsychology Visit  Patient:  [name] Prickett Sr.   DOB: [birth year]  MR Number: [id number]  Location: [facility] [place], [place] [id number] [place] [address] Dept: [phone]  Date of Service: 07/05/2019  Start: 10 AM End: 11 AM  Duration of Service: 1 Hour  Provider/Observer:     Edgardo Roys PsyD  Chief Complaint:      Chief Complaint  Patient presents with  . Anxiety  . Pain  . Panic Attack    Reason For Service:      [name] Samson. [name] Manriquez. is a 58 year old male who was referred by [place] [facility] for neuropsychological/psychological consultation.  The patient has a prior history of anxiety going back 20 or 30 years with treatment utilizing benzodiazepine.  He is also had colon surgery in 2001 and cervical spine surgery with fusion in 2014.  The patient had surgery in 2018 on his kidney and there were extensive complications that resulted in the patient spending 3 months sedated and unconscious in the hospital and then 1 month rehabilitation before he was discharged home.  Within 2 days of going home and a significant reduction in the medications that he was given the patient developed a severe full-blown panic attack that is left longstanding residual effects on him with recurrent panic attacks and a great deal of anxiety in anticipation of the possibility of other panic attacks.  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  The patient also was on a ventilator for 2 to 3 weeks and the rest the time he was sedated and has no memory of his hospitalization for the 3 months until he woke up.  The patient spent another month after he came to before he was discharged.  The patient reports that after  awakening his wife tried to explain what it happened.  The patient has residual scars and surgical sites all over his stomach.  The patient reports that all of this took a toll and when he got out of the hospital in October of that year he went home in 2 days after he went home he had a severe panic attack.  They had raised his benzodiazepine and other pain medications while in the hospital and his medications were put back to his pre-level of medications.  The patient reports that he constantly thinks of the events where this panic attack left him thinking that he was in imminent danger of dying.  The patient talks of the trauma of that day and how it is "still in his head".  The patient reports that he will tremble whenever he thinks about that it might happen again.  The patient does report some panic attacks continuing since then.  The patient reports that this initial severe panic attack lasted 45 minutes to 1 hour and that he simply "lost control."  The patient talks of this trauma continuing to set a great baseline of fear for the patient and that he has a constant fear that this could happen again at any time.  The patient reports that he had anxiety going back to his early 47s but this is nothing like he had before.  The patient describes disturbed sleep.  He reports that he will go to bed around 11:30 PM  and he will usually fall asleep around 1230 or 1 AM.  The patient reports that he often wakes up at 5 or 6 AM.  The patient reports that he has a good diet and appetite most of the time and he eats about twice a day.  The patient reports that he has lost more than 50 pounds since before his surgery.  The patient reports that he does have some increased attentional problems and forgetfulness versus his premorbid functioning.  Treatment Interventions:  Today we worked on Radiographer, therapeutic and strategies around his significant panic disorder and helping him understand the underlying properties of his panic  disorder and their development.  The patient continues to experience significant panic disorder and they are having a negative impact on his ability to function.  Participation Level:   Active  Participation Quality:  Appropriate and Attentive      Behavioral Observation:  Well Groomed, Alert, and Appropriate.   Current Psychosocial Factors: The patient reports that he is struggling with the reduced activity that he has in social activities.  The patient reports that he is avoiding getting out but that he has begun working on his sleep patterns.  Content of Session:   Reviewed current symptoms and worked on therapeutic interventions including systematic desensitization of panic disorder.  Effectiveness of Interventions: The patient had numerous questions today and continues to struggle with understanding how these panic symptoms developed in the relationship to his surgeries.  Target Goals:   Reducing the intensity, duration and significant frequency of his panic attacks.  Goals Last Reviewed:   07/05/2019  Goals Addressed Today:    Today we worked on achieving skills around systematic desensitization of his panic disorder.  Impression/Diagnosis:   [name] Poe. [name] Geddis. is a 58 year old male who was referred by [place] [facility] for neuropsychological/psychological consultation.  The patient has a prior history of anxiety going back 20 or 30 years with treatment utilizing benzodiazepine.  He is also had colon surgery in 2001 and cervical spine surgery with fusion in 2014.  The patient had surgery in 2018 on his kidney and there were extensive complications that resulted in the patient spending 3 months sedated and unconscious in the hospital and then 1 month rehabilitation before he was discharged home.  Within 2 days of going home and a significant reduction in the medications that he was given the patient developed a severe full-blown panic attack that is left longstanding residual effects on  him with recurrent panic attacks and a great deal of anxiety in anticipation of the possibility of other panic attacks.  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 Clinical Psychologist Neuropsychologist

## 2019-07-11 DIAGNOSIS — M5416 Radiculopathy, lumbar region: Secondary | ICD-10-CM | POA: Diagnosis not present

## 2019-07-11 DIAGNOSIS — Z6828 Body mass index (BMI) 28.0-28.9, adult: Secondary | ICD-10-CM | POA: Diagnosis not present

## 2019-07-11 DIAGNOSIS — I10 Essential (primary) hypertension: Secondary | ICD-10-CM | POA: Diagnosis not present

## 2019-07-11 DIAGNOSIS — M4722 Other spondylosis with radiculopathy, cervical region: Secondary | ICD-10-CM | POA: Diagnosis not present

## 2019-07-15 DIAGNOSIS — F411 Generalized anxiety disorder: Secondary | ICD-10-CM | POA: Diagnosis not present

## 2019-07-15 DIAGNOSIS — H9202 Otalgia, left ear: Secondary | ICD-10-CM | POA: Diagnosis not present

## 2019-07-19 ENCOUNTER — Encounter

## 2019-07-19 ENCOUNTER — Encounter (HOSPITAL_BASED_OUTPATIENT_CLINIC_OR_DEPARTMENT_OTHER): Payer: Medicare HMO | Admitting: Psychology

## 2019-07-19 ENCOUNTER — Other Ambulatory Visit: Payer: Self-pay

## 2019-07-19 DIAGNOSIS — F411 Generalized anxiety disorder: Secondary | ICD-10-CM | POA: Diagnosis not present

## 2019-07-19 DIAGNOSIS — S46812A Strain of other muscles, fascia and tendons at shoulder and upper arm level, left arm, initial encounter: Secondary | ICD-10-CM | POA: Diagnosis not present

## 2019-07-19 DIAGNOSIS — S0990XA Unspecified injury of head, initial encounter: Secondary | ICD-10-CM | POA: Diagnosis not present

## 2019-07-19 DIAGNOSIS — Z23 Encounter for immunization: Secondary | ICD-10-CM | POA: Diagnosis not present

## 2019-07-19 DIAGNOSIS — F4001 Agoraphobia with panic disorder: Secondary | ICD-10-CM

## 2019-07-22 ENCOUNTER — Encounter: Payer: Self-pay | Admitting: Psychology

## 2019-07-22 NOTE — Progress Notes (Signed)
Neuropsychology Visit  Patient:  [name] Schrandt Sr.   DOB: [birth year]  MR Number: [id number]  Location: [facility] [place], [place] [id number] [place] [address] Dept: [phone]  Date of Service: 07/19/2019  Start: 10 AM End: 11 AM  Duration of Service: 1 Hour  Provider/Observer:     Edgardo Roys PsyD  Chief Complaint:      Chief Complaint  Patient presents with  . Anxiety  . Panic Attack  . Sleeping Problem    Reason For Service:      [name] Barraco. [name] Arendt. is a 58 year old male who was referred by [place] [facility] for neuropsychological/psychological consultation.  The patient has a prior history of anxiety going back 20 or 30 years with treatment utilizing benzodiazepine.  He is also had colon surgery in 2001 and cervical spine surgery with fusion in 2014.  The patient had surgery in 2018 on his kidney and there were extensive complications that resulted in the patient spending 3 months sedated and unconscious in the hospital and then 1 month rehabilitation before he was discharged home.  Within 2 days of going home and a significant reduction in the medications that he was given the patient developed a severe full-blown panic attack that is left longstanding residual effects on him with recurrent panic attacks and a great deal of anxiety in anticipation of the possibility of other panic attacks.  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  The patient also was on a ventilator for 2 to 3 weeks and the rest the time he was sedated and has no memory of his hospitalization for the 3 months until he woke up.  The patient spent another month after he came to before he was discharged.  The patient reports  that after awakening his wife tried to explain what it happened.  The patient has residual scars and surgical sites all over his stomach.  The patient reports that all of this took a toll and when he got out of the hospital in October of that year he went home in 2 days after he went home he had a severe panic attack.  They had raised his benzodiazepine and other pain medications while in the hospital and his medications were put back to his pre-level of medications.  The patient reports that he constantly thinks of the events where this panic attack left him thinking that he was in imminent danger of dying.  The patient talks of the trauma of that day and how it is "still in his head".  The patient reports that he will tremble whenever he thinks about that it might happen again.  The patient does report some panic attacks continuing since then.  The patient reports that this initial severe panic attack lasted 45 minutes to 1 hour and that he simply "lost control."  The patient talks of this trauma continuing to set a great baseline of fear for the patient and that he has a constant fear that this could happen again at any time.  The patient reports that he had anxiety going back to his early 62s but this is nothing like he had before.  The patient describes disturbed sleep.  He reports that he will go to bed around 11:30  PM and he will usually fall asleep around 1230 or 1 AM.  The patient reports that he often wakes up at 5 or 6 AM.  The patient reports that he has a good diet and appetite most of the time and he eats about twice a day.  The patient reports that he has lost more than 50 pounds since before his surgery.  The patient reports that he does have some increased attentional problems and forgetfulness versus his premorbid functioning.  Treatment Interventions:  Today we worked on Radiographer, therapeutic and strategies around his significant panic disorder and helping him understand the underlying properties of  his panic disorder and their development.  The patient continues to experience significant panic disorder and they are having a negative impact on his ability to function.  Participation Level:   Active  Participation Quality:  Appropriate and Attentive      Behavioral Observation:  Well Groomed, Alert, and Appropriate.   Current Psychosocial Factors: The patient reports that he is struggling with the reduced activity that he has in social activities.  The patient reports that he is avoiding getting out but that he has begun working on his sleep patterns.  Content of Session:   Reviewed current symptoms and worked on therapeutic interventions including systematic desensitization of panic disorder.  Effectiveness of Interventions: The patient had numerous questions today and continues to struggle with understanding how these panic symptoms developed in the relationship to his surgeries.  Target Goals:   Reducing the intensity, duration and significant frequency of his panic attacks.  Goals Last Reviewed:   07/19/2019  Goals Addressed Today:    Today we worked on achieving skills around systematic desensitization of his panic disorder.  Impression/Diagnosis:   [name] Poe. [name] Paparella. is a 58 year old male who was referred by [place] [facility] for neuropsychological/psychological consultation.  The patient has a prior history of anxiety going back 20 or 30 years with treatment utilizing benzodiazepine.  He is also had colon surgery in 2001 and cervical spine surgery with fusion in 2014.  The patient had surgery in 2018 on his kidney and there were extensive complications that resulted in the patient spending 3 months sedated and unconscious in the hospital and then 1 month rehabilitation before he was discharged home.  Within 2 days of going home and a significant reduction in the medications that he was given the patient developed a severe full-blown panic attack that is left longstanding residual  effects on him with recurrent panic attacks and a great deal of anxiety in anticipation of the possibility of other panic attacks.  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 Clinical Psychologist Neuropsychologist

## 2019-08-01 ENCOUNTER — Other Ambulatory Visit: Payer: Self-pay

## 2019-08-01 ENCOUNTER — Encounter (HOSPITAL_COMMUNITY): Payer: Self-pay | Admitting: Psychiatry

## 2019-08-01 ENCOUNTER — Ambulatory Visit (INDEPENDENT_AMBULATORY_CARE_PROVIDER_SITE_OTHER): Payer: Medicare HMO | Admitting: Psychiatry

## 2019-08-01 DIAGNOSIS — F410 Panic disorder [episodic paroxysmal anxiety] without agoraphobia: Secondary | ICD-10-CM

## 2019-08-01 DIAGNOSIS — F411 Generalized anxiety disorder: Secondary | ICD-10-CM

## 2019-08-01 MED ORDER — VORTIOXETINE HBR 20 MG PO TABS: 20.0000 mg | ORAL_TABLET | Freq: Every day | ORAL | 2 refills | Status: DC

## 2019-08-01 NOTE — Progress Notes (Signed)
Virtual Visit via Telephone Note  I connected with [name] Mcgloin Sr. on 08/01/19 at 11:40 AM EDT by telephone and verified that I am speaking with the correct person using two identifiers.   I discussed the limitations, risks, security and privacy concerns of performing an evaluation and management service by telephone and the availability of in person appointments. I also discussed with the patient that there may be a patient responsible charge related to this service. The patient expressed understanding and agreed to proceed.   History of Present Illness: Patient was evaluated by phone session.  He is doing much better on Trintellix.  He is taking 10 mg.  He admitted his anxiety and depression is not as bad but he continues to take lorazepam 2 mg twice a day and trazodone prescribed by his other physician.  We chooses Trintellix after [employer] testing.  He is also in therapy with Dr. Sima Matas.  He is sleeping 7 hours.  Denies any panic attack since the last visit.  He has no tremors, shakes or any EPS.  Lives with his wife who is very supportive.  He denies any feeling of hopelessness or any suicidal thoughts.  His energy level is okay.  His appetite is okay.  He denies drinking or using any illegal substances.  Patient is on disability due to his neck surgery.   Past psychiatric history; H/O anxiety since early 1s. 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 No h/o mania, psychosis, suicidal attempt, inpatient treatment. Seen and managed his anxiety by PCP since early 17s and took Remeron, hydroxyzine, gabapentin, Neurontin, Paxil, Prozac, Zoloft and Lamictal. Most of the time he had tremors or sexual side effects.    Assessment and Plan: Generalized anxiety disorder.  Panic attacks.  Patient still have some residual anxiety and I recommend to try Trintellix 20 mg since he has no side effects from the medication.  He agreed with the plan.  He is getting lorazepam 2  mg twice a day and trazodone 75 mg from his primary care physician.  We discussed cutting down his lorazepam since he is taking a very high dose and he agreed with the plan.  He promised he will try to reduce his lorazepam.  Encouraged to continue therapy for Dr. Sima Matas.  We will try Trintellix 20 mg and we will provide samples.  We will also start prior authorization to get his Trintellix from the pharmacy.  Recommended to call us back if he has any question of any concern.  Follow-up in 3 months.   Follow Up Instructions:    I discussed the assessment and treatment plan with the patient. The patient was provided an opportunity to ask questions and all were answered. The patient agreed with the plan and demonstrated an understanding of the instructions.   The patient was advised to call back or seek an in-person evaluation if the symptoms worsen or if the condition fails to improve as anticipated.  I provided 20 minutes of non-face-to-face time during this encounter.   [name], MD

## 2019-08-02 ENCOUNTER — Encounter: Payer: Medicare HMO | Admitting: Psychology

## 2019-08-10 ENCOUNTER — Telehealth (HOSPITAL_COMMUNITY): Payer: Self-pay

## 2019-08-10 NOTE — Telephone Encounter (Signed)
He will need samples.

## 2019-08-10 NOTE — Telephone Encounter (Signed)
I called patients insurance about the Trintellix, it has been authorized, but the cost to the patient is still $250. Patient can not use a copay card because he has Medicare. Please review and advise, thank you

## 2019-08-10 NOTE — Telephone Encounter (Signed)
I called patient and let him know we would continue samples for now. Patient is looking to change insurance this month and will call me when his new policy goes into affect.

## 2019-08-16 ENCOUNTER — Encounter: Payer: Medicare HMO | Attending: Psychology | Admitting: Psychology

## 2019-08-16 ENCOUNTER — Other Ambulatory Visit: Payer: Self-pay

## 2019-08-16 DIAGNOSIS — F4001 Agoraphobia with panic disorder: Secondary | ICD-10-CM | POA: Diagnosis not present

## 2019-08-16 DIAGNOSIS — F411 Generalized anxiety disorder: Secondary | ICD-10-CM

## 2019-08-30 ENCOUNTER — Encounter: Payer: Self-pay | Admitting: Psychology

## 2019-08-30 NOTE — Progress Notes (Signed)
Neuropsychology Visit  Patient:  Charlis Stuchell Sr.   DOB: [birth year]  MR Number: [id number]  Location: [facility] [place], [place] [id number] [place] [address] Dept: [phone]  Date of Service: 08/16/2019  Start: 10 AM End: 11 AM  Duration of Service: 1 Hour  Provider/Observer:     Edgardo Roys PsyD  Chief Complaint:      Chief Complaint  Patient presents with  . Anxiety  . Panic Attack  . Sleeping Problem    Reason For Service:      Issaac Claywell. Ehaan Stagnaro. is a 58 year old male who was referred by [place] [facility] for neuropsychological/psychological consultation.  The patient has a prior history of anxiety going back 20 or 30 years with treatment utilizing benzodiazepine.  He is also had colon surgery in 2001 and cervical spine surgery with fusion in 2014.  The patient had surgery in 2018 on his kidney and there were extensive complications that resulted in the patient spending 3 months sedated and unconscious in the hospital and then 1 month rehabilitation before he was discharged home.  Within 2 days of going home and a significant reduction in the medications that he was given the patient developed a severe full-blown panic attack that is left longstanding residual effects on him with recurrent panic attacks and a great deal of anxiety in anticipation of the possibility of other panic attacks.  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  The patient also was on a ventilator for 2 to 3 weeks and the rest the time he was sedated and has no memory of his hospitalization for the 3 months until he woke up.  The patient spent another month after he came to before he was discharged.  The patient reports  that after awakening his wife tried to explain what it happened.  The patient has residual scars and surgical sites all over his stomach.  The patient reports that all of this took a toll and when he got out of the hospital in October of that year he went home in 2 days after he went home he had a severe panic attack.  They had raised his benzodiazepine and other pain medications while in the hospital and his medications were put back to his pre-level of medications.  The patient reports that he constantly thinks of the events where this panic attack left him thinking that he was in imminent danger of dying.  The patient talks of the trauma of that day and how it is "still in his head".  The patient reports that he will tremble whenever he thinks about that it might happen again.  The patient does report some panic attacks continuing since then.  The patient reports that this initial severe panic attack lasted 45 minutes to 1 hour and that he simply "lost control."  The patient talks of this trauma continuing to set a great baseline of fear for the patient and that he has a constant fear that this could happen again at any time.  The patient reports that he had anxiety going back to his early 89s but this is nothing like he had before.  The patient describes disturbed sleep.  He reports that he will go to bed around 11:30  PM and he will usually fall asleep around 1230 or 1 AM.  The patient reports that he often wakes up at 5 or 6 AM.  The patient reports that he has a good diet and appetite most of the time and he eats about twice a day.  The patient reports that he has lost more than 50 pounds since before his surgery.  The patient reports that he does have some increased attentional problems and forgetfulness versus his premorbid functioning.  Treatment Interventions:  Today we worked on Radiographer, therapeutic and strategies around his significant panic disorder and helping him understand the underlying properties of  his panic disorder and their development.  The patient continues to experience significant panic disorder and they are having a negative impact on his ability to function.  Participation Level:   Active  Participation Quality:  Appropriate and Attentive      Behavioral Observation:  Well Groomed, Alert, and Appropriate.   Current Psychosocial Factors: The patient reports that he is struggling with the reduced activity that he has in social activities.  The patient reports that he is avoiding getting out but that he has begun working on his sleep patterns.  Content of Session:   Reviewed current symptoms and worked on therapeutic interventions including systematic desensitization of panic disorder.  Effectiveness of Interventions: The patient had numerous questions today and continues to struggle with understanding how these panic symptoms developed in the relationship to his surgeries.  Target Goals:   Reducing the intensity, duration and significant frequency of his panic attacks.  Goals Last Reviewed:   07/19/2019  Goals Addressed Today:    Today we worked on achieving skills around systematic desensitization of his panic disorder.  Impression/Diagnosis:   [name] Sambo Bassano. is a 58 year old male who was referred by [place] [facility] for neuropsychological/psychological consultation.  The patient has a prior history of anxiety going back 20 or 30 years with treatment utilizing benzodiazepine.  He is also had colon surgery in 2001 and cervical spine surgery with fusion in 2014.  The patient had surgery in 2018 on his kidney and there were extensive complications that resulted in the patient spending 3 months sedated and unconscious in the hospital and then 1 month rehabilitation before he was discharged home.  Within 2 days of going home and a significant reduction in the medications that he was given the patient developed a severe full-blown panic attack that is left longstanding residual  effects on him with recurrent panic attacks and a great deal of anxiety in anticipation of the possibility of other panic attacks.  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 Clinical Psychologist Neuropsychologist

## 2019-09-01 DIAGNOSIS — I10 Essential (primary) hypertension: Secondary | ICD-10-CM | POA: Diagnosis not present

## 2019-09-01 DIAGNOSIS — F411 Generalized anxiety disorder: Secondary | ICD-10-CM | POA: Diagnosis not present

## 2019-09-01 DIAGNOSIS — Z79899 Other long term (current) drug therapy: Secondary | ICD-10-CM | POA: Diagnosis not present

## 2019-09-08 ENCOUNTER — Emergency Department (HOSPITAL_COMMUNITY): Payer: Medicare HMO

## 2019-09-08 ENCOUNTER — Other Ambulatory Visit: Payer: Self-pay

## 2019-09-08 ENCOUNTER — Emergency Department (HOSPITAL_COMMUNITY)
Admission: EM | Admit: 2019-09-08 | Discharge: 2019-09-08 | Disposition: A | Discharge status: Home / Self Care | Payer: Medicare HMO | Attending: Emergency Medicine | Admitting: Emergency Medicine

## 2019-09-08 DIAGNOSIS — Z87891 Personal history of nicotine dependence: Secondary | ICD-10-CM | POA: Diagnosis not present

## 2019-09-08 DIAGNOSIS — E039 Hypothyroidism, unspecified: Secondary | ICD-10-CM | POA: Insufficient documentation

## 2019-09-08 DIAGNOSIS — J449 Chronic obstructive pulmonary disease, unspecified: Secondary | ICD-10-CM | POA: Insufficient documentation

## 2019-09-08 DIAGNOSIS — Z79899 Other long term (current) drug therapy: Secondary | ICD-10-CM | POA: Diagnosis not present

## 2019-09-08 DIAGNOSIS — R0789 Other chest pain: Secondary | ICD-10-CM | POA: Diagnosis not present

## 2019-09-08 DIAGNOSIS — R1012 Left upper quadrant pain: Secondary | ICD-10-CM

## 2019-09-08 DIAGNOSIS — R202 Paresthesia of skin: Secondary | ICD-10-CM | POA: Insufficient documentation

## 2019-09-08 DIAGNOSIS — G894 Chronic pain syndrome: Secondary | ICD-10-CM | POA: Insufficient documentation

## 2019-09-08 DIAGNOSIS — I10 Essential (primary) hypertension: Secondary | ICD-10-CM | POA: Insufficient documentation

## 2019-09-08 DIAGNOSIS — R079 Chest pain, unspecified: Secondary | ICD-10-CM | POA: Diagnosis not present

## 2019-09-08 DIAGNOSIS — R0602 Shortness of breath: Secondary | ICD-10-CM | POA: Insufficient documentation

## 2019-09-08 LAB — COMPREHENSIVE METABOLIC PANEL
ALT: 40 U/L (ref 0–44)
AST: 34 U/L (ref 15–41)
Albumin: 3.9 g/dL (ref 3.5–5.0)
Alkaline Phosphatase: 80 U/L (ref 38–126)
Anion gap: 9 (ref 5–15)
BUN: 12 mg/dL (ref 6–20)
CO2: 23 mmol/L (ref 22–32)
Calcium: 9.2 mg/dL (ref 8.9–10.3)
Chloride: 105 mmol/L (ref 98–111)
Creatinine, Ser: 1.05 mg/dL (ref 0.61–1.24)
GFR calc Af Amer: >60 mL/min (ref >60)
GFR calc non Af Amer: >60 mL/min (ref >60)
Glucose, Bld: 125 mg/dL — ABNORMAL HIGH (ref 70–99)
Potassium: 3.5 mmol/L (ref 3.5–5.1)
Sodium: 137 mmol/L (ref 135–145)
Total Bilirubin: 0.5 mg/dL (ref 0.3–1.2)
Total Protein: 6.9 g/dL (ref 6.5–8.1)

## 2019-09-08 LAB — URINALYSIS, ROUTINE W REFLEX MICROSCOPIC
Bilirubin Urine: NEGATIVE
Glucose, UA: NEGATIVE mg/dL
Hgb urine dipstick: NEGATIVE
Ketones, ur: NEGATIVE mg/dL
Leukocytes,Ua: NEGATIVE
Nitrite: NEGATIVE
Protein, ur: NEGATIVE mg/dL
Specific Gravity, Urine: 1.02 (ref 1.005–1.030)
pH: 5 (ref 5.0–8.0)

## 2019-09-08 LAB — CBC
HCT: 40.8 % (ref 39.0–52.0)
Hemoglobin: 12.9 g/dL — ABNORMAL LOW (ref 13.0–17.0)
MCH: 28.3 pg (ref 26.0–34.0)
MCHC: 31.6 g/dL (ref 30.0–36.0)
MCV: 89.5 fL (ref 80.0–100.0)
Platelets: 307 10*3/uL (ref 150–400)
RBC: 4.56 MIL/uL (ref 4.22–5.81)
RDW: 14.7 % (ref 11.5–15.5)
WBC: 9 10*3/uL (ref 4.0–10.5)
nRBC: 0 % (ref 0.0–0.2)

## 2019-09-08 LAB — TROPONIN I (HIGH SENSITIVITY)
Troponin I (High Sensitivity): 4 ng/L (ref <18)
Troponin I (High Sensitivity): 4 ng/L (ref <18)

## 2019-09-08 LAB — LIPASE, BLOOD: Lipase: 29 U/L (ref 11–51)

## 2019-09-08 MED ORDER — ONDANSETRON 4 MG PO TBDP
4.0000 mg | ORAL_TABLET | Freq: Once | ORAL | Status: AC
  Administered 2019-09-08: 4 mg via ORAL
  Filled 2019-09-08: qty 1

## 2019-09-08 MED ORDER — HYDROMORPHONE HCL 1 MG/ML IJ SOLN
1.0000 mg | Freq: Once | INTRAMUSCULAR | Status: AC
  Administered 2019-09-08: 1 mg via INTRAMUSCULAR
  Filled 2019-09-08: qty 1

## 2019-09-08 MED ORDER — OXYCODONE-ACETAMINOPHEN 5-325 MG PO TABS
2.0000 | ORAL_TABLET | Freq: Once | ORAL | Status: DC
  Filled 2019-09-08: qty 2

## 2019-09-08 NOTE — ED Provider Notes (Signed)
[facility] DEPT Provider Note   CSN: [id number] Arrival date & time: 09/08/19  1207     History   Chief Complaint Chief Complaint  Patient presents with  . Chest Pain  . Abdominal Pain    HPI [name] Maheu Sr. is a 58 y.o. male with history of chronic pain syndrome, HTN, anxiety/depression, who presents with chest pain.  Patient states for the past 4 to 5 days he has had intermittent substernal chest discomfort.  Cannot identify any aggravating or alleviating factors.  He notes that the area is tender to palpation.  He started a new medication for his blood pressure last week and is wondering if this could be contributing to his symptoms.  This morning he woke up with chest discomfort and he had SOB and his left arm was tingling and his neck was hurting and this prompted him to come to the ED.  He has a history of chronic neck and low back pain which has been aggravated since he has been sitting in the waiting room for several hours.  He endorses history of sciatica and radicular symptoms.  He is also been having a lot of stress, increased anxiety, and depression lately.  He also tells me about his abdominal pain which is been intermittent for several days.  It is in the left upper quadrant.  Nothing makes it better or worse.  He is wondering if it is related to starting probiotics.  He has chronic diarrhea which is unchanged.  He reports a major abdominal surgery several years ago due to a bowel perforation. He denies fever, chills, cough, wheezing, N/V.     HPI  Past Medical History:  Diagnosis Date  . Anemia   . Anxiety   . Arthritis    neck, shoulder, left knee  . 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 Erythrocytosis 11/30/2014  . Essential hypertension   . GERD (gastroesophageal reflux disease)   . Headache(784.0)    2 x/week  . History of kidney stones   . Hypothyroidism    hx of   . Leukocytosis, unspecified 11/03/2013  . Limited joint  range of motion    cervical fusion 02/2013  . Medial meniscus tear 06/2013   left knee  . 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 Throat and mouth symptom    itchy throat x 3 weeks  . Thyroid nodule   . Tobacco abuse   . Wears partial dentures    upper    Patient Active Problem List   Diagnosis Date Noted  . PVC (premature ventricular contraction)   . Anxiety state 08/14/2017  . Hypomagnesemia 06/28/2017  . Hypothyroidism   . 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 Protein-calorie malnutrition, severe ([place]) 06/27/2017  . 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 Pressure injury of skin 05/29/2017  . Ileus ([place])   . Hypokalemia 05/14/2017  . Anastomotic leak of intestine 05/14/2017  . Wound dehiscence, surgical 05/14/2017  . Aspiration pneumonia ([place]) 05/14/2017  . Chronic narcotic use 05/10/2017  . Generalized anxiety disorder 05/10/2017  . Intra-abdominal adhesions s/p SB resection 05/06/2017 05/09/2017  . Throat symptom 03/18/2016  . Multinodular goiter 01/19/2016  . Hyperthyroidism 01/19/2016  . Essential hypertension   . Diarrhea 11/30/2014  . Anemia, iron deficiency 11/01/2014  . Leukocytosis 11/03/2013  . Tobacco abuse 07/26/2013  . COPD (chronic obstructive pulmonary disease) ([place]) 07/26/2013  . Left knee pain 07/26/2013  .  Pain in joint, shoulder region 05/03/2013  . GERD 07/24/2008  . 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 ABDOMINAL SURGERY    . ANTERIOR CERVICAL DECOMP/DISCECTOMY FUSION N/A 03/18/2013   Procedure: ANTERIOR CERVICAL DECOMPRESSION/DISCECTOMY FUSION 3 LEVELS;  Surgeon: Erline Levine, MD;  Location: [facility];  Service: Neurosurgery;  Laterality: N/A;  Anterior Cervical Three-Six  Decompression/Diskectomy/Fusion  . APPENDECTOMY    . CIRCUMCISION  05/04/2006  . COLONOSCOPY    .  CYSTOSCOPY/URETEROSCOPY/HOLMIUM LASER/STENT PLACEMENT Left 11/24/2018   Procedure: CYSTOSCOPY/URETEROSCOPY/HOLMIUM LASER/STENT PLACEMENT;  Surgeon: Lucas Mallow, MD;  Location: [facility];  Service: Urology;  Laterality: Left;  . EYE SURGERY     bilateral cataract surgery   . HEMICOLECTOMY Right [id number]   with ileocolic anastomosis  . INGUINAL HERNIA REPAIR    . INSERTION OF MESH N/A 07/31/2017   Procedure: INSERTION OF MESH;  Surgeon: Johnathan Hausen, MD;  Location: [facility];  Service: General;  Laterality: N/A;  . KNEE ARTHROSCOPY Left 08/01/2013   Procedure: LEFT ARTHROSCOPY KNEE;  Surgeon: Kerin Salen, MD;  Location: [facility];  Service: Orthopedics;  Laterality: Left;  . LAPAROSCOPIC ADRENALECTOMY Right 05/06/2017   Procedure: OPEN RIGHT ADRENALECTOMY WITH SMALL BOWEL RESECTION AND ANASTOMOSIS;  Surgeon: Kieth Brightly, Arta Bruce, MD;  Location: [facility];  Service: General;  Laterality: Right;  . LAPAROSCOPIC LYSIS OF ADHESIONS N/A 07/31/2017   Procedure: LYSIS OF ADHESIONS;  Surgeon: Johnathan Hausen, MD;  Location: [facility];  Service: General;  Laterality: N/A;  . LAPAROSCOPY N/A 07/31/2017   Procedure: LAPAROSCOPY DIAGNOSTIC;  Surgeon: Johnathan Hausen, MD;  Location: [facility];  Service: General;  Laterality: N/A;  . LAPAROTOMY N/A 05/13/2017   Procedure: EXPLORATORY LAPAROTOMY, with wound dehisense, and repair of anastamotic leak;  Surgeon: Mickeal Skinner, MD;  Location: [facility];  Service: General;  Laterality: N/A;  . LAPAROTOMY N/A 05/14/2017   Procedure: EXPLORATORY LAPAROTOMY, LYSIS OF ADHESIONS/ SMALL BOWEL RESECTION/ WASHOUT OF ABDOMEN AND PLACEMENT OF NEGATIVE PRESSURE DRESSING;  Surgeon: Kieth Brightly, Arta Bruce, MD;  Location: [facility];  Service: General;  Laterality: N/A;  . LAPAROTOMY N/A 05/17/2017   Procedure: EXPLORATORY LAPAROTOMY, PLACEMENT OF ILEOSTOMY TUBES, PARTIAL CLOSURE OF FASCIA, WOUND VAC EXCHANGE;  Surgeon: [name], Arta Bruce, MD;   Location: [facility];  Service: General;  Laterality: N/A;  . LAPAROTOMY N/A 05/20/2017   Procedure: ABDOMINAL WOUND [place] OUT, WOUND CLOSURE AND WOUND VAC PLACEMENT;  Surgeon: [name], Arta Bruce, MD;  Location: [facility];  Service: General;  Laterality: N/A;  . LAPAROTOMY N/A 07/31/2017   Procedure: EXPLORATORY LAPAROTOMY WITH CLOSURE OF ENTEROTOMICS;  Surgeon: Johnathan Hausen, MD;  Location: [facility];  Service: General;  Laterality: N/A;  . SHOULDER ARTHROSCOPY W/ ROTATOR CUFF REPAIR Left 07/16/2010  . SMALL INTESTINE SURGERY  04/24/2000   adhesiolysis, ileocolic anastomosis  . UPPER GASTROINTESTINAL ENDOSCOPY          Home Medications    Prior to Admission medications   Medication Sig Start Date End Date Taking? Authorizing Provider  acetaminophen (TYLENOL) 500 MG tablet Take 1,000 mg by mouth every 6 (six) hours as needed for mild pain.   Yes [provider]  amLODipine (NORVASC) 10 MG tablet Take 10 mg by mouth daily.  03/11/19  Yes [provider]  cyclobenzaprine (FLEXERIL) 10 MG tablet Take 0.5-1 tablets (5-10 mg total) by mouth 2 (two) times daily as needed for muscle spasms. 06/03/19  Yes Harris,  Abigail, PA-C  diclofenac sodium (VOLTAREN) 1 % GEL Apply 4 g 4 (four) times daily topically. Patient taking differently: Apply 4 g topically 4 (four) times daily as needed (pain).  09/04/17  Yes Focht, Jessica L, PA  dicyclomine (BENTYL) 20 MG tablet Take 20 mg by mouth 3 (three) times daily as needed. 09/01/19  Yes [provider]  famotidine (PEPCID) 40 MG tablet Take 40 mg by mouth daily. 07/18/19  Yes [provider]  hydrochlorothiazide (HYDRODIURIL) 12.5 MG tablet Take 12.5 mg by mouth daily. 09/01/19  Yes [provider]  LORazepam (ATIVAN) 2 MG tablet Take 2 mg by mouth 2 (two) times daily as needed. 08/26/19  Yes [provider]  Magnesium 200 MG TABS Take 400 mg by mouth daily.   Yes [provider]  Multiple Vitamin (MULTIVITAMIN  WITH MINERALS) TABS Take 1 tablet by mouth daily.   Yes [provider]  omeprazole (PRILOSEC) 40 MG capsule Take 40 mg by mouth at bedtime.    Yes [provider]  oxyCODONE-acetaminophen (PERCOCET) 10-325 MG tablet Take 1 tablet by mouth 4 (four) times daily.   Yes [provider]  sildenafil (VIAGRA) 100 MG tablet Take 100 mg by mouth daily as needed for erectile dysfunction.   Yes [provider]  telmisartan (MICARDIS) 80 MG tablet Take 80 mg by mouth at bedtime.   Yes [provider]  traZODone (DESYREL) 100 MG tablet Take 75 mg by mouth at bedtime.    Yes [provider]  vortioxetine HBr (TRINTELLIX) 20 MG TABS tablet Take 1 tablet (20 mg total) by mouth daily. 08/01/19  Yes Arfeen, Arlyce Harman, MD  methylPREDNISolone (MEDROL DOSEPAK) 4 MG TBPK tablet Use as directed Patient not taking: Reported on 09/08/2019 06/03/19   Margarita Mail, PA-C  tamsulosin (FLOMAX) 0.4 MG CAPS capsule Take 1 capsule (0.4 mg total) by mouth daily. Patient not taking: Reported on 09/08/2019 11/24/18   Lucas Mallow, MD  simethicone (MYLICON) 80 MG chewable tablet Chew 80 mg by mouth every 6 (six) hours as needed for flatulence.  02/01/14  [provider]    Family History Family History  Problem Relation Age of Onset  . Cancer Father 35       colon ca - deceased.  . Colon cancer Father   . Cancer Sister 68       breast ca  . Thyroid disease Neg Hx     Social History Social History   Tobacco Use  . Smoking status: Former Smoker    Packs/day: 1.00    Years: 25.00    Pack years: 25.00    Types: Cigarettes    Quit date: 2017    Years since quitting: 3.8  . Smokeless tobacco: Never Used  . Tobacco comment: quit smoking 3 years ago  Substance Use Topics  . Alcohol use: Yes    Comment: occasionally  . Drug use: No     Allergies   Chantix [varenicline], Morphine, and Penicillins   Review of Systems Review of Systems   Constitutional: Negative for chills and fever.  Respiratory: Positive for shortness of breath.   Cardiovascular: Positive for chest pain.  Gastrointestinal: Positive for abdominal pain.  Musculoskeletal: Positive for back pain, myalgias and neck pain.  Neurological: Negative for syncope and headaches.  All other systems reviewed and are negative.    Physical Exam Updated Vital Signs BP 120/87 (BP Location: Left Arm)   Pulse 90   Temp 98 F (36.7  C)   Resp 18   SpO2 100%   Physical Exam Vitals signs and nursing note reviewed.  Constitutional:      General: He is not in acute distress.    Appearance: He is well-developed. He is not ill-appearing.  HENT:     Head: Normocephalic and atraumatic.  Eyes:     General: No scleral icterus.       Right eye: No discharge.        Left eye: No discharge.     Conjunctiva/sclera: Conjunctivae normal.     Pupils: Pupils are equal, round, and reactive to light.  Neck:     Musculoskeletal: Normal range of motion.  Cardiovascular:     Rate and Rhythm: Normal rate and regular rhythm.  Pulmonary:     Effort: Pulmonary effort is normal. No respiratory distress.     Breath sounds: Normal breath sounds.  Chest:     Chest wall: Tenderness (reproducible tenderness for sternum) present.  Abdominal:     General: There is no distension.     Palpations: Abdomen is soft.     Tenderness: There is no abdominal tenderness.  Musculoskeletal:     Right lower leg: No edema.     Left lower leg: No edema.  Skin:    General: Skin is warm and dry.  Neurological:     Mental Status: He is alert and oriented to person, place, and time.  Psychiatric:        Behavior: Behavior normal.      ED Treatments / Results  Labs (all labs ordered are listed, but only abnormal results are displayed) Labs Reviewed  COMPREHENSIVE METABOLIC PANEL - Abnormal; Notable for the following components:      Result Value   Glucose, Bld 125 (*)    All other components  within normal limits  CBC - Abnormal; Notable for the following components:   Hemoglobin 12.9 (*)    All other components within normal limits  LIPASE, BLOOD  URINALYSIS, ROUTINE W REFLEX MICROSCOPIC  TROPONIN I (HIGH SENSITIVITY)  TROPONIN I (HIGH SENSITIVITY)    EKG None  Radiology Dg Chest 2 View  Result Date: 09/08/2019 CLINICAL DATA:  Worsening chest pain over the past 4-5 days. EXAM: CHEST - 2 VIEW COMPARISON:  11/12/2018 FINDINGS: The cardiac silhouette, mediastinal and hilar contours are within normal limits and stable. There is mild tortuosity and calcification of the thoracic aorta. Stable mild underlying emphysematous changes. No infiltrates or effusions. No worrisome pulmonary lesions. Artifact from EKG leads are noted. The bony thorax. IMPRESSION: No acute cardiopulmonary findings. Electronically Signed   By: Marijo Sanes M.D.   On: 09/08/2019 13:20    Procedures Procedures (including critical care time)  Medications Ordered in ED Medications  HYDROmorphone (DILAUDID) injection 1 mg (1 mg Intramuscular Given 09/08/19 1706)  ondansetron (ZOFRAN-ODT) disintegrating tablet 4 mg (4 mg Oral Given 09/08/19 1746)     Initial Impression / Assessment and Plan / ED Course  I have reviewed the triage vital signs and the nursing notes.  Pertinent labs & imaging results that were available during my care of the patient were reviewed by me and considered in my medical decision making (see chart for details).  58 year old male with chest pain, abdominal pain, worsening chronic pain. Vitals are normal. Chest pain work up is reassuring. Doubt ACS, PE, pericarditis, esophageal rupture, tension pneumothorax, aortic dissection, cardiac tamponade. EKG is NSR. CXR shows mild emphysema and calcification of aorta. Initial and second troponin  is 4. Labs are unremarkable. UA is normal. No significant past or family hx of cardiac disease. Patient is non-smoker now per his report. Chest pain is  atypical and do not think he needs obs for this at this time although he does have risk factors. Abdomen is soft and non-tender. Do not feel he needs imaging today. He was given pain control for his neck and back pain. Results were discussed extensively with pt. Encouraged him to f/u with his pain doctor, ortho, and his PCP.   Final Clinical Impressions(s) / ED Diagnoses   Final diagnoses:  Atypical chest pain  Chronic pain syndrome  Left upper quadrant abdominal pain    ED Discharge Orders    None       Recardo Evangelist, PA-C 09/09/19 1730    Daleen Bo, MD 09/11/19 [phone]

## 2019-09-08 NOTE — ED Triage Notes (Signed)
Pt c/o cenral chest pains that radiates to neck and left arm x 4 days. Reports started new HTN med x 5 days ago. Also having left abd pains x 2 and started probiotics 3 days ago. Pt reports always has diarrhea due to abd surgery.

## 2019-09-08 NOTE — Discharge Instructions (Signed)
All the tests for your heart and your blood work today looked good Please follow up with your pain management doctor and orthopedics Return if worsening

## 2019-09-10 ENCOUNTER — Encounter (HOSPITAL_COMMUNITY): Payer: Self-pay

## 2019-09-10 ENCOUNTER — Other Ambulatory Visit: Payer: Self-pay

## 2019-09-10 ENCOUNTER — Ambulatory Visit (HOSPITAL_COMMUNITY)
Admission: EM | Admit: 2019-09-10 | Discharge: 2019-09-10 | Disposition: A | Discharge status: Home / Self Care | Payer: Medicare HMO | Attending: Radiology | Admitting: Radiology

## 2019-09-10 DIAGNOSIS — H9202 Otalgia, left ear: Secondary | ICD-10-CM | POA: Diagnosis not present

## 2019-09-10 MED ORDER — AFRIN NASAL SPRAY 0.05 % NA SOLN: 1.0000 | Freq: Two times a day (BID) | NASAL | 0 refills | Status: DC

## 2019-09-10 MED ORDER — FLUTICASONE PROPIONATE 50 MCG/ACT NA SUSP: 1.0000 | Freq: Every day | NASAL | 0 refills | Status: DC

## 2019-09-10 NOTE — ED Provider Notes (Signed)
[facility]    CSN: [id number] Arrival date & time: 09/10/19  1558      History   Chief Complaint Chief Complaint  Patient presents with  . Ear Fullness    Left     HPI [name] Zajicek Sr. is a 58 y.o. male.  presents with ear fullness to his left ear  X 2 days patient states that was diagnoses with an ear infection and completed the antibiotic 3 weeks ago. Patient was as seen in the ED and told he had ear wax. Patient states that he went home and OTC ear wax removal. Patient states since then he has a "fullness" and decrease in hearing. Condition is cute in nature. Condition is made better by nothing. Condition is made worse by nothing. Patient denies any treatment prior to there arrival at this facility.     HPI  Past Medical History:  Diagnosis Date  . Anemia   . Anxiety   . Arthritis    neck, shoulder, left knee  . 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 Erythrocytosis 11/30/2014  . Essential hypertension   . GERD (gastroesophageal reflux disease)   . Headache(784.0)    2 x/week  . History of kidney stones   . Hypothyroidism    hx of   . Leukocytosis, unspecified 11/03/2013  . Limited joint range of motion    cervical fusion 02/2013  . Medial meniscus tear 06/2013   left knee  . 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 Throat and mouth symptom    itchy throat x 3 weeks  . Thyroid nodule   . Tobacco abuse   . Wears partial dentures    upper    Patient Active Problem List   Diagnosis Date Noted  . PVC (premature ventricular contraction)   . Anxiety state 08/14/2017  . Hypomagnesemia 06/28/2017  . Hypothyroidism   . 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 Protein-calorie malnutrition, severe ([place]) 06/27/2017  . 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 Pressure injury of skin 05/29/2017  . Ileus ([place])   . Hypokalemia 05/14/2017  . Anastomotic  leak of intestine 05/14/2017  . Wound dehiscence, surgical 05/14/2017  . Aspiration pneumonia ([place]) 05/14/2017  . Chronic narcotic use 05/10/2017  . Generalized anxiety disorder 05/10/2017  . Intra-abdominal adhesions s/p SB resection 05/06/2017 05/09/2017  . Throat symptom 03/18/2016  . Multinodular goiter 01/19/2016  . Hyperthyroidism 01/19/2016  . Essential hypertension   . Diarrhea 11/30/2014  . Anemia, iron deficiency 11/01/2014  . Leukocytosis 11/03/2013  . Tobacco abuse 07/26/2013  . COPD (chronic obstructive pulmonary disease) ([place]) 07/26/2013  . Left knee pain 07/26/2013  . Pain in joint, shoulder region 05/03/2013  . GERD 07/24/2008  . 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 ABDOMINAL SURGERY    . ANTERIOR CERVICAL DECOMP/DISCECTOMY FUSION N/A 03/18/2013   Procedure: ANTERIOR CERVICAL DECOMPRESSION/DISCECTOMY FUSION 3 LEVELS;  Surgeon: Erline Levine, MD;  Location: [facility];  Service: Neurosurgery;  Laterality: N/A;  Anterior Cervical Three-Six  Decompression/Diskectomy/Fusion  . APPENDECTOMY    . CIRCUMCISION  05/04/2006  . COLONOSCOPY    . CYSTOSCOPY/URETEROSCOPY/HOLMIUM LASER/STENT PLACEMENT Left 11/24/2018   Procedure: CYSTOSCOPY/URETEROSCOPY/HOLMIUM LASER/STENT PLACEMENT;  Surgeon: Lucas Mallow, MD;  Location[facility]  Service: Urology;  Laterality: Left;  . EYE SURGERY     bilateral cataract surgery   .  HEMICOLECTOMY Right [id number]   with ileocolic anastomosis  . INGUINAL HERNIA REPAIR    . INSERTION OF MESH N/A 07/31/2017   Procedure: INSERTION OF MESH;  Surgeon: Johnathan Hausen, MD;  Location: [facility];  Service: General;  Laterality: N/A;  . KNEE ARTHROSCOPY Left 08/01/2013   Procedure: LEFT ARTHROSCOPY KNEE;  Surgeon: Kerin Salen, MD;  Location: [facility];  Service: Orthopedics;  Laterality: Left;  . LAPAROSCOPIC ADRENALECTOMY Right 05/06/2017   Procedure:  OPEN RIGHT ADRENALECTOMY WITH SMALL BOWEL RESECTION AND ANASTOMOSIS;  Surgeon: Kieth Brightly, Arta Bruce, MD;  Location: [facility];  Service: General;  Laterality: Right;  . LAPAROSCOPIC LYSIS OF ADHESIONS N/A 07/31/2017   Procedure: LYSIS OF ADHESIONS;  Surgeon: Johnathan Hausen, MD;  Location: [facility];  Service: General;  Laterality: N/A;  . LAPAROSCOPY N/A 07/31/2017   Procedure: LAPAROSCOPY DIAGNOSTIC;  Surgeon: Johnathan Hausen, MD;  Location: [facility];  Service: General;  Laterality: N/A;  . LAPAROTOMY N/A 05/13/2017   Procedure: EXPLORATORY LAPAROTOMY, with wound dehisense, and repair of anastamotic leak;  Surgeon: Mickeal Skinner, MD;  Location: [facility];  Service: General;  Laterality: N/A;  . LAPAROTOMY N/A 05/14/2017   Procedure: EXPLORATORY LAPAROTOMY, LYSIS OF ADHESIONS/ SMALL BOWEL RESECTION/ WASHOUT OF ABDOMEN AND PLACEMENT OF NEGATIVE PRESSURE DRESSING;  Surgeon: Kieth Brightly, Arta Bruce, MD;  Location: [facility];  Service: General;  Laterality: N/A;  . LAPAROTOMY N/A 05/17/2017   Procedure: EXPLORATORY LAPAROTOMY, PLACEMENT OF ILEOSTOMY TUBES, PARTIAL CLOSURE OF FASCIA, WOUND VAC EXCHANGE;  Surgeon: [name], Arta Bruce, MD;  Location: [facility];  Service: General;  Laterality: N/A;  . LAPAROTOMY N/A 05/20/2017   Procedure: ABDOMINAL WOUND [place] OUT, WOUND CLOSURE AND WOUND VAC PLACEMENT;  Surgeon: [name], Arta Bruce, MD;  Location: [facility];  Service: General;  Laterality: N/A;  . LAPAROTOMY N/A 07/31/2017   Procedure: EXPLORATORY LAPAROTOMY WITH CLOSURE OF ENTEROTOMICS;  Surgeon: Johnathan Hausen, MD;  Location: [facility];  Service: General;  Laterality: N/A;  . SHOULDER ARTHROSCOPY W/ ROTATOR CUFF REPAIR Left 07/16/2010  . SMALL INTESTINE SURGERY  04/24/2000   adhesiolysis, ileocolic anastomosis  . UPPER GASTROINTESTINAL ENDOSCOPY         Home Medications    Prior to Admission medications   Medication Sig Start Date End Date Taking? Authorizing Provider  acetaminophen (TYLENOL) 500 MG tablet Take  1,000 mg by mouth every 6 (six) hours as needed for mild pain.    [provider]  amLODipine (NORVASC) 10 MG tablet Take 10 mg by mouth daily.  03/11/19   [provider]  cyclobenzaprine (FLEXERIL) 10 MG tablet Take 0.5-1 tablets (5-10 mg total) by mouth 2 (two) times daily as needed for muscle spasms. 06/03/19   Margarita Mail, PA-C  diclofenac sodium (VOLTAREN) 1 % GEL Apply 4 g 4 (four) times daily topically. Patient taking differently: Apply 4 g topically 4 (four) times daily as needed (pain).  09/04/17   Focht, Fraser Din, PA  dicyclomine (BENTYL) 20 MG tablet Take 20 mg by mouth 3 (three) times daily as needed. 09/01/19   [provider]  famotidine (PEPCID) 40 MG tablet Take 40 mg by mouth daily. 07/18/19   [provider]  hydrochlorothiazide (HYDRODIURIL) 12.5 MG tablet Take 12.5 mg by mouth daily. 09/01/19   [provider]  LORazepam (ATIVAN) 2 MG tablet Take 2 mg by mouth 2 (two) times daily as needed. 08/26/19   [provider]  Magnesium 200 MG TABS Take 400 mg by mouth daily.  [provider]  Multiple Vitamin (MULTIVITAMIN WITH MINERALS) TABS Take 1 tablet by mouth daily.    [provider]  omeprazole (PRILOSEC) 40 MG capsule Take 40 mg by mouth at bedtime.     [provider]  oxyCODONE-acetaminophen (PERCOCET) 10-325 MG tablet Take 1 tablet by mouth 4 (four) times daily.    [provider]  sildenafil (VIAGRA) 100 MG tablet Take 100 mg by mouth daily as needed for erectile dysfunction.    [provider]  telmisartan (MICARDIS) 80 MG tablet Take 80 mg by mouth at bedtime.    [provider]  traZODone (DESYREL) 100 MG tablet Take 75 mg by mouth at bedtime.     [provider]  vortioxetine HBr (TRINTELLIX) 20 MG TABS tablet Take 1 tablet (20 mg total) by mouth daily. 08/01/19   Arfeen, Arlyce Harman, MD  simethicone (MYLICON) 80 MG chewable tablet Chew 80 mg by mouth every 6  (six) hours as needed for flatulence.  02/01/14  [provider]    Family History Family History  Problem Relation Age of Onset  . Cancer Father 43       colon ca - deceased.  . Colon cancer Father   . Cancer Sister 7       breast ca  . Thyroid disease Neg Hx     Social History Social History   Tobacco Use  . Smoking status: Former Smoker    Packs/day: 1.00    Years: 25.00    Pack years: 25.00    Types: Cigarettes    Quit date: 2017    Years since quitting: 3.8  . Smokeless tobacco: Never Used  . Tobacco comment: quit smoking 3 years ago  Substance Use Topics  . Alcohol use: Yes    Comment: occasionally  . Drug use: No     Allergies   Chantix [varenicline], Morphine, and Penicillins   Review of Systems Review of Systems  Constitutional: Negative for chills and fever.  HENT: Negative for ear pain ( fullness left ear) and sore throat.   Eyes: Negative for pain and visual disturbance.  Respiratory: Negative for cough and shortness of breath.   Cardiovascular: Negative for chest pain and palpitations.  Gastrointestinal: Negative for abdominal pain and vomiting.  Genitourinary: Negative for dysuria and hematuria.  Musculoskeletal: Negative for arthralgias and back pain.  Skin: Negative for color change and rash.  Neurological: Negative for seizures and syncope.  All other systems reviewed and are negative.    Physical Exam Triage Vital Signs ED Triage Vitals  Enc Vitals Group     BP 09/10/19 1634 132/82     Pulse Rate 09/10/19 1634 97     Resp 09/10/19 1634 18     Temp 09/10/19 1634 98 F (36.7 C)     Temp Source 09/10/19 1634 Skin     SpO2 09/10/19 1634 99 %     Weight --      Height --      Head Circumference --      Peak Flow --      Pain Score 09/10/19 1635 7     Pain Loc --      Pain Edu? --      Excl. in [facility]? --    No data found.  Updated Vital Signs BP 132/82 (BP Location: Right Arm)   Pulse 97   Temp 98 F (36.7 C) (Skin)    Resp 18   SpO2 99%  Physical Exam Vitals signs and nursing note reviewed.  Constitutional:      Appearance: He is well-developed.  HENT:     Head: Normocephalic.     Right Ear: Tympanic membrane normal.     Ears:     Comments: Effusion noted to left ear Neck:     Musculoskeletal: Normal range of motion.  Pulmonary:     Effort: Pulmonary effort is normal.  Musculoskeletal: Normal range of motion.  Skin:    General: Skin is dry.  Neurological:     Mental Status: He is alert and oriented to person, place, and time.      UC Treatments / Results  Labs (all labs ordered are listed, but only abnormal results are displayed) Labs Reviewed - No data to display  EKG   Radiology No results found.  Procedures Procedures (including critical care time)  Medications Ordered in UC Medications - No data to display  Initial Impression / Assessment and Plan / UC Course  I have reviewed the triage vital signs and the nursing notes.  Pertinent labs & imaging results that were available during my care of the patient were reviewed by me and considered in my medical decision making (see chart for details).      Final Clinical Impressions(s) / UC Diagnoses   Final diagnoses:  None   Discharge Instructions   None    ED Prescriptions    None     PDMP not reviewed this encounter.   Jacqualine Mau, NP 09/10/19 1805

## 2019-09-10 NOTE — ED Triage Notes (Signed)
Pt present left ear fullness. Pt states he attempted to unclogged his ear with some ear wax removal but was unsuccessful.

## 2019-09-10 NOTE — Discharge Instructions (Addendum)
Do not use the afrin more than 3 days

## 2019-09-13 DIAGNOSIS — H9202 Otalgia, left ear: Secondary | ICD-10-CM | POA: Diagnosis not present

## 2019-09-13 DIAGNOSIS — I10 Essential (primary) hypertension: Secondary | ICD-10-CM | POA: Diagnosis not present

## 2019-09-27 DIAGNOSIS — Z125 Encounter for screening for malignant neoplasm of prostate: Secondary | ICD-10-CM | POA: Diagnosis not present

## 2019-09-27 DIAGNOSIS — I10 Essential (primary) hypertension: Secondary | ICD-10-CM | POA: Diagnosis not present

## 2019-09-27 DIAGNOSIS — Z0000 Encounter for general adult medical examination without abnormal findings: Secondary | ICD-10-CM | POA: Diagnosis not present

## 2019-09-27 DIAGNOSIS — Z136 Encounter for screening for cardiovascular disorders: Secondary | ICD-10-CM | POA: Diagnosis not present

## 2019-09-27 DIAGNOSIS — Z79899 Other long term (current) drug therapy: Secondary | ICD-10-CM | POA: Diagnosis not present

## 2019-10-03 DIAGNOSIS — F410 Panic disorder [episodic paroxysmal anxiety] without agoraphobia: Secondary | ICD-10-CM | POA: Diagnosis not present

## 2019-10-03 DIAGNOSIS — Z0000 Encounter for general adult medical examination without abnormal findings: Secondary | ICD-10-CM | POA: Diagnosis not present

## 2019-10-03 DIAGNOSIS — E7800 Pure hypercholesterolemia, unspecified: Secondary | ICD-10-CM | POA: Diagnosis not present

## 2019-10-03 DIAGNOSIS — L309 Dermatitis, unspecified: Secondary | ICD-10-CM | POA: Diagnosis not present

## 2019-10-03 DIAGNOSIS — F411 Generalized anxiety disorder: Secondary | ICD-10-CM | POA: Diagnosis not present

## 2019-10-03 DIAGNOSIS — I10 Essential (primary) hypertension: Secondary | ICD-10-CM | POA: Diagnosis not present

## 2019-10-04 ENCOUNTER — Telehealth (HOSPITAL_COMMUNITY): Payer: Self-pay | Admitting: *Deleted

## 2019-10-04 NOTE — Telephone Encounter (Signed)
Pt called requesting more samples of the Trintellix. Pt has an appointment upcoming on 11/01/19. Please review and advise.

## 2019-10-04 NOTE — Telephone Encounter (Signed)
Please provide Trintellix 20 mg samples if available.

## 2019-10-10 DIAGNOSIS — M4722 Other spondylosis with radiculopathy, cervical region: Secondary | ICD-10-CM | POA: Diagnosis not present

## 2019-10-10 DIAGNOSIS — M5416 Radiculopathy, lumbar region: Secondary | ICD-10-CM | POA: Diagnosis not present

## 2019-11-01 ENCOUNTER — Other Ambulatory Visit: Payer: Self-pay

## 2019-11-01 ENCOUNTER — Encounter (HOSPITAL_COMMUNITY): Payer: Self-pay | Admitting: Psychiatry

## 2019-11-01 ENCOUNTER — Ambulatory Visit (INDEPENDENT_AMBULATORY_CARE_PROVIDER_SITE_OTHER): Payer: Medicare HMO | Admitting: Psychiatry

## 2019-11-01 DIAGNOSIS — F410 Panic disorder [episodic paroxysmal anxiety] without agoraphobia: Secondary | ICD-10-CM

## 2019-11-01 DIAGNOSIS — F411 Generalized anxiety disorder: Secondary | ICD-10-CM

## 2019-11-01 MED ORDER — VORTIOXETINE HBR 20 MG PO TABS: 20.0000 mg | ORAL_TABLET | Freq: Every day | ORAL | 2 refills | Status: DC

## 2019-11-01 MED ORDER — LAMOTRIGINE 25 MG PO TABS: ORAL_TABLET | ORAL | 1 refills | Status: DC

## 2019-11-01 NOTE — Progress Notes (Signed)
Virtual Visit via Telephone Note  I connected with [name] Stiteler Sr. on 11/01/19 at 10:00 AM EST by telephone and verified that I am speaking with the correct person using two identifiers.   I discussed the limitations, risks, security and privacy concerns of performing an evaluation and management service by telephone and the availability of in person appointments. I also discussed with the patient that there may be a patient responsible charge related to this service. The patient expressed understanding and agreed to proceed.   History of Present Illness: Patient was evaluated by phone session.  He had a good Christmas.  He is doing well on Trintellix.  Now he is thinking to cut down his lorazepam to try to cut down the past his Ativan he started to have jitteriness.  He is sleeping good.  He admitted not able to see Dr. Sima Matas in past few months because his schedule was busy.  However he had upcoming appointment with Dr. Sima Matas.  He is getting Trintellix samples from our office.  He denies any major panic attack.  He denies any irritability, anger, mania.  He lives with his wife was very supportive.  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  He is on disability due to his neck surgery  Past psychiatric history; H/Oanxiety since early 32s. 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 No h/omania, psychosis, suicidal attempt, inpatient treatment. Seen and managed his anxiety byPCPsince early 30s andtookRemeron, hydroxyzine, gabapentin, Paxil, Prozac, Zoloft and Lamictal. Stopped due to tremors or sexual side effects.    Psychiatric Specialty Exam: Physical Exam  Review of Systems  There were no vitals taken for this visit.There is no height or weight on file to calculate BMI.  General Appearance: NA  Eye Contact:  NA  Speech:  Clear and Coherent  Volume:  Normal  Mood:  Euthymic  Affect:  NA  Thought Process:  Goal  Directed  Orientation:  Full (Time, Place, and Person)  Thought Content:  WDL  Suicidal Thoughts:  No  Homicidal Thoughts:  No  Memory:  Immediate;   Fair Recent;   Good Remote;   Good  Judgement:  Fair  Insight:  Present  Psychomotor Activity:  NA  Concentration:  Concentration: Fair and Attention Span: Fair  Recall:  Good  Fund of Knowledge:  Good  Language:  Good  Akathisia:  mild tremors  Handed:  Right  AIMS (if indicated):     Assets:  Communication Skills Desire for Improvement Housing Resilience Social Support  ADL's:  Intact  Cognition:  WNL  Sleep:   good      Assessment and Plan: Generalized anxiety disorder.  Panic attacks.  I talked to him about slowly reducing his lorazepam.  He is getting Ativan 2 mg twice a day and trazodone 75 mg for PCP.  I recommend that he can try Lamictal 25 mg daily for 1 week and then 50 mg daily.  Since he had tried cutting down his lorazepam he has shakes and tremors.  I discussed with her concern is seizure and Lamictal can help his anxiety and prevent any seizures.  He had tried gabapentin but did not like it.  We talked about slowly tapering his Ativan.  Continue Trintellix 20 mg samples from the office.  We will continue trazodone from his PCP.  He will start cutting down his lorazepam that he is getting from PCP from 3 mg  for 2 weeks and then 2 mg until his next appointment.  Follow-up in 6 weeks.  Follow Up Instructions:    I discussed the assessment and treatment plan with the patient. The patient was provided an opportunity to ask questions and all were answered. The patient agreed with the plan and demonstrated an understanding of the instructions.   The patient was advised to call back or seek an in-person evaluation if the symptoms worsen or if the condition fails to improve as anticipated.  I provided 20 minutes of non-face-to-face time during this encounter.   [name], MD

## 2019-11-12 ENCOUNTER — Other Ambulatory Visit: Payer: Self-pay

## 2019-11-12 ENCOUNTER — Encounter (HOSPITAL_COMMUNITY): Payer: Self-pay | Admitting: Emergency Medicine

## 2019-11-12 ENCOUNTER — Emergency Department (HOSPITAL_COMMUNITY)
Admission: EM | Admit: 2019-11-12 | Discharge: 2019-11-12 | Disposition: A | Discharge status: Home / Self Care | Payer: Medicare HMO | Attending: Emergency Medicine | Admitting: Emergency Medicine

## 2019-11-12 ENCOUNTER — Emergency Department (HOSPITAL_COMMUNITY): Payer: Medicare HMO

## 2019-11-12 DIAGNOSIS — E039 Hypothyroidism, unspecified: Secondary | ICD-10-CM | POA: Insufficient documentation

## 2019-11-12 DIAGNOSIS — R197 Diarrhea, unspecified: Secondary | ICD-10-CM | POA: Diagnosis not present

## 2019-11-12 DIAGNOSIS — N201 Calculus of ureter: Secondary | ICD-10-CM

## 2019-11-12 DIAGNOSIS — J449 Chronic obstructive pulmonary disease, unspecified: Secondary | ICD-10-CM | POA: Insufficient documentation

## 2019-11-12 DIAGNOSIS — R109 Unspecified abdominal pain: Secondary | ICD-10-CM | POA: Diagnosis not present

## 2019-11-12 DIAGNOSIS — Z87891 Personal history of nicotine dependence: Secondary | ICD-10-CM | POA: Diagnosis not present

## 2019-11-12 DIAGNOSIS — Z79899 Other long term (current) drug therapy: Secondary | ICD-10-CM | POA: Diagnosis not present

## 2019-11-12 DIAGNOSIS — I10 Essential (primary) hypertension: Secondary | ICD-10-CM | POA: Diagnosis not present

## 2019-11-12 LAB — CBC WITH DIFFERENTIAL/PLATELET
Abs Immature Granulocytes: 0.04 10*3/uL (ref 0.00–0.07)
Basophils Absolute: 0 10*3/uL (ref 0.0–0.1)
Basophils Relative: 0 %
Eosinophils Absolute: 0.2 10*3/uL (ref 0.0–0.5)
Eosinophils Relative: 2 %
HCT: 37.6 % — ABNORMAL LOW (ref 39.0–52.0)
Hemoglobin: 12.1 g/dL — ABNORMAL LOW (ref 13.0–17.0)
Immature Granulocytes: 0 %
Lymphocytes Relative: 25 %
Lymphs Abs: 2.6 10*3/uL (ref 0.7–4.0)
MCH: 28.3 pg (ref 26.0–34.0)
MCHC: 32.2 g/dL (ref 30.0–36.0)
MCV: 87.9 fL (ref 80.0–100.0)
Monocytes Absolute: 1 10*3/uL (ref 0.1–1.0)
Monocytes Relative: 10 %
Neutro Abs: 6.3 10*3/uL (ref 1.7–7.7)
Neutrophils Relative %: 63 %
Platelets: 274 10*3/uL (ref 150–400)
RBC: 4.28 MIL/uL (ref 4.22–5.81)
RDW: 14.6 % (ref 11.5–15.5)
WBC: 10.2 10*3/uL (ref 4.0–10.5)
nRBC: 0 % (ref 0.0–0.2)

## 2019-11-12 LAB — COMPREHENSIVE METABOLIC PANEL
ALT: 29 U/L (ref 0–44)
AST: 28 U/L (ref 15–41)
Albumin: 3.7 g/dL (ref 3.5–5.0)
Alkaline Phosphatase: 72 U/L (ref 38–126)
Anion gap: 10 (ref 5–15)
BUN: 13 mg/dL (ref 6–20)
CO2: 22 mmol/L (ref 22–32)
Calcium: 8.8 mg/dL — ABNORMAL LOW (ref 8.9–10.3)
Chloride: 108 mmol/L (ref 98–111)
Creatinine, Ser: 1.11 mg/dL (ref 0.61–1.24)
GFR calc Af Amer: >60 mL/min (ref >60)
GFR calc non Af Amer: >60 mL/min (ref >60)
Glucose, Bld: 90 mg/dL (ref 70–99)
Potassium: 3.5 mmol/L (ref 3.5–5.1)
Sodium: 140 mmol/L (ref 135–145)
Total Bilirubin: 0.7 mg/dL (ref 0.3–1.2)
Total Protein: 6.4 g/dL — ABNORMAL LOW (ref 6.5–8.1)

## 2019-11-12 LAB — URINALYSIS, ROUTINE W REFLEX MICROSCOPIC
Bacteria, UA: NONE SEEN
Bilirubin Urine: NEGATIVE
Glucose, UA: NEGATIVE mg/dL
Ketones, ur: NEGATIVE mg/dL
Leukocytes,Ua: NEGATIVE
Nitrite: NEGATIVE
Protein, ur: NEGATIVE mg/dL
RBC / HPF: >50 RBC/hpf — ABNORMAL HIGH (ref 0–5)
Specific Gravity, Urine: 1.017 (ref 1.005–1.030)
pH: 5 (ref 5.0–8.0)

## 2019-11-12 LAB — LIPASE, BLOOD: Lipase: 20 U/L (ref 11–51)

## 2019-11-12 MED ORDER — SODIUM CHLORIDE (PF) 0.9 % IJ SOLN
INTRAMUSCULAR | Status: AC
  Filled 2019-11-12: qty 50

## 2019-11-12 MED ORDER — IOHEXOL 300 MG/ML  SOLN
100.0000 mL | Freq: Once | INTRAMUSCULAR | Status: AC | PRN
  Administered 2019-11-12: 100 mL via INTRAVENOUS

## 2019-11-12 MED ORDER — FENTANYL CITRATE (PF) 100 MCG/2ML IJ SOLN
50.0000 ug | Freq: Once | INTRAMUSCULAR | Status: AC
  Administered 2019-11-12: 50 ug via INTRAVENOUS
  Filled 2019-11-12: qty 2

## 2019-11-12 MED ORDER — FENTANYL CITRATE (PF) 100 MCG/2ML IJ SOLN
25.0000 ug | Freq: Once | INTRAMUSCULAR | Status: AC
  Administered 2019-11-12: 25 ug via INTRAVENOUS
  Filled 2019-11-12: qty 2

## 2019-11-12 MED ORDER — KETOROLAC TROMETHAMINE 30 MG/ML IJ SOLN
30.0000 mg | Freq: Once | INTRAMUSCULAR | Status: AC
  Administered 2019-11-12: 30 mg via INTRAVENOUS
  Filled 2019-11-12: qty 1

## 2019-11-12 MED ORDER — SODIUM CHLORIDE 0.9 % IV BOLUS
1000.0000 mL | Freq: Once | INTRAVENOUS | Status: AC
  Administered 2019-11-12: 1000 mL via INTRAVENOUS

## 2019-11-12 MED ORDER — TAMSULOSIN HCL 0.4 MG PO CAPS: 0.4000 mg | ORAL_CAPSULE | Freq: Every day | ORAL | 0 refills | Status: DC

## 2019-11-12 MED ORDER — ONDANSETRON HCL 4 MG/2ML IJ SOLN
4.0000 mg | Freq: Once | INTRAMUSCULAR | Status: AC
  Administered 2019-11-12: 4 mg via INTRAVENOUS
  Filled 2019-11-12: qty 2

## 2019-11-12 MED ORDER — OXYCODONE-ACETAMINOPHEN 5-325 MG PO TABS
1.0000 | ORAL_TABLET | Freq: Once | ORAL | Status: AC
  Administered 2019-11-12: 1 via ORAL
  Filled 2019-11-12: qty 1

## 2019-11-12 NOTE — ED Notes (Signed)
Patient reports having increased diarrhea x 2 days.

## 2019-11-12 NOTE — ED Triage Notes (Signed)
Per pt, states right abdominal pain radiating to his back-slow stream urination-states diarrhea as well-history of abdominal/colon surgery 2 years ago

## 2019-11-12 NOTE — ED Provider Notes (Signed)
[facility] DEPT Provider Note   CSN: [id number] Arrival date & time: 11/12/19  0932     History Chief Complaint  Patient presents with   Abdominal Pain   Dysuria    [name] Knauer Sr. is a 59 y.o. male with a past medical history of hypertension, kidney stones, status post resection pheochromocytoma, ileus, presenting to the ED with a chief complaint of right-sided abdominal pain radiating to his back.  Symptoms began 2 days ago with associated diarrhea and "slow stream" urination.  Reports nausea but denies any vomiting.  Does note that he ate popcorn 2 days ago which is unusual for him prior to symptom onset.  No sick contacts with similar symptoms.  Reports multiple abdominal surgeries in the past including a bowel blockage about 10 years ago.  States that his back pain feels similar to his prior kidney stones but the abdominal pain is different.  He is not passing flatulence for last bowel movement was yesterday and was diarrhea.  Denies any fevers, hematuria, injuries or falls, numbness in arms or legs, blood in stool or vomit, prostate issues, shortness of breath.  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   left   Erythrocytosis 11/30/2014   Essential hypertension    GERD (gastroesophageal reflux disease)    Headache(784.0)    2 x/week   History of kidney stones    Hypothyroidism    hx of    Leukocytosis, unspecified 11/03/2013   Limited joint range of motion    cervical fusion 02/2013   Medial meniscus tear 06/2013   left knee   Pheochromocytoma, right, 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 06/27/2017   Throat and mouth symptom    itchy throat x 3 weeks   Thyroid nodule    Tobacco abuse    Wears partial dentures    upper    Patient Active Problem List   Diagnosis Date Noted   PVC (premature ventricular contraction)     Anxiety state 08/14/2017   Hypomagnesemia 06/28/2017   Hypothyroidism    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   Protein-calorie malnutrition, severe ([place]) 06/27/2017   Pheochromocytoma, right, 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 06/27/2017   Pressure injury of skin 05/29/2017   Ileus ([place])    Hypokalemia 05/14/2017   Anastomotic leak of intestine 05/14/2017   Wound dehiscence, surgical 05/14/2017   Aspiration pneumonia ([place]) 05/14/2017   Chronic narcotic use 05/10/2017   Generalized anxiety disorder 05/10/2017   Intra-abdominal adhesions s/p SB resection 05/06/2017 05/09/2017   Throat symptom 03/18/2016   Multinodular goiter 01/19/2016   Hyperthyroidism 01/19/2016   Essential hypertension    Diarrhea 11/30/2014   Anemia, iron deficiency 11/01/2014   Leukocytosis 11/03/2013   Tobacco abuse 07/26/2013   COPD (chronic obstructive pulmonary disease) ([place]) 07/26/2013   Left knee pain 07/26/2013   Pain in joint, shoulder region 05/03/2013   GERD 07/24/2008   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    Past Surgical History:  Procedure Laterality Date   ABDOMINAL SURGERY     ANTERIOR CERVICAL DECOMP/DISCECTOMY FUSION N/A 03/18/2013   Procedure: ANTERIOR CERVICAL DECOMPRESSION/DISCECTOMY FUSION 3 LEVELS;  Surgeon: Erline Levine, MD;  Location: [facility];  Service: Neurosurgery;  Laterality: N/A;  Anterior Cervical Three-Six  Decompression/Diskectomy/Fusion   APPENDECTOMY     CIRCUMCISION  05/04/2006  COLONOSCOPY     CYSTOSCOPY/URETEROSCOPY/HOLMIUM LASER/STENT PLACEMENT Left 11/24/2018   Procedure: CYSTOSCOPY/URETEROSCOPY/HOLMIUM LASER/STENT PLACEMENT;  Surgeon: Lucas Mallow, MD;  Location: [facility];  Service: Urology;  Laterality: Left;   EYE SURGERY     bilateral cataract surgery    HEMICOLECTOMY Right [id number]   with ileocolic anastomosis   INGUINAL HERNIA REPAIR     INSERTION  OF MESH N/A 07/31/2017   Procedure: INSERTION OF MESH;  Surgeon: Johnathan Hausen, MD;  Location: [facility];  Service: General;  Laterality: N/A;   KNEE ARTHROSCOPY Left 08/01/2013   Procedure: LEFT ARTHROSCOPY KNEE;  Surgeon: Kerin Salen, MD;  Location: [facility];  Service: Orthopedics;  Laterality: Left;   LAPAROSCOPIC ADRENALECTOMY Right 05/06/2017   Procedure: OPEN RIGHT ADRENALECTOMY WITH SMALL BOWEL RESECTION AND ANASTOMOSIS;  Surgeon: Kieth Brightly, Arta Bruce, MD;  Location: [facility];  Service: General;  Laterality: Right;   LAPAROSCOPIC LYSIS OF ADHESIONS N/A 07/31/2017   Procedure: LYSIS OF ADHESIONS;  Surgeon: Johnathan Hausen, MD;  Location: [facility];  Service: General;  Laterality: N/A;   LAPAROSCOPY N/A 07/31/2017   Procedure: LAPAROSCOPY DIAGNOSTIC;  Surgeon: Johnathan Hausen, MD;  Location: [facility];  Service: General;  Laterality: N/A;   LAPAROTOMY N/A 05/13/2017   Procedure: EXPLORATORY LAPAROTOMY, with wound dehisense, and repair of anastamotic leak;  Surgeon: [name], Arta Bruce, MD;  Location: [facility];  Service: General;  Laterality: N/A;   LAPAROTOMY N/A 05/14/2017   Procedure: EXPLORATORY LAPAROTOMY, LYSIS OF ADHESIONS/ SMALL BOWEL RESECTION/ WASHOUT OF ABDOMEN AND PLACEMENT OF NEGATIVE PRESSURE DRESSING;  Surgeon: [name], Arta Bruce, MD;  Location: [facility];  Service: General;  Laterality: N/A;   LAPAROTOMY N/A 05/17/2017   Procedure: EXPLORATORY LAPAROTOMY, PLACEMENT OF ILEOSTOMY TUBES, PARTIAL CLOSURE OF FASCIA, WOUND VAC EXCHANGE;  Surgeon: [name], Arta Bruce, MD;  Location: [facility];  Service: General;  Laterality: N/A;   LAPAROTOMY N/A 05/20/2017   Procedure: ABDOMINAL WOUND [place] OUT, WOUND CLOSURE AND WOUND VAC PLACEMENT;  Surgeon: [name], Arta Bruce, MD;  Location: [facility];  Service: General;  Laterality: N/A;   LAPAROTOMY N/A 07/31/2017   Procedure: EXPLORATORY LAPAROTOMY WITH CLOSURE OF ENTEROTOMICS;  Surgeon: Johnathan Hausen, MD;  Location: [facility];  Service:  General;  Laterality: N/A;   SHOULDER ARTHROSCOPY W/ ROTATOR CUFF REPAIR Left 07/16/2010   SMALL INTESTINE SURGERY  04/24/2000   adhesiolysis, ileocolic anastomosis   UPPER GASTROINTESTINAL ENDOSCOPY         Family History  Problem Relation Age of Onset   Cancer Father 34       colon ca - deceased.   Colon cancer Father    Cancer Sister 70       breast ca   Thyroid disease Neg Hx     Social History   Tobacco Use   Smoking status: Former Smoker    Packs/day: 1.00    Years: 25.00    Pack years: 25.00    Types: Cigarettes    Quit date: 2017    Years since quitting: 4.0   Smokeless tobacco: Never Used   Tobacco comment: quit smoking 3 years ago  Substance Use Topics   Alcohol use: Yes    Comment: occasionally   Drug use: No    Home Medications Prior to Admission medications   Medication Sig Start Date End Date Taking? Authorizing Provider  acetaminophen (TYLENOL) 500 MG tablet Take 1,000 mg by mouth every 6 (six) hours as needed for mild pain.   Yes [provider]  amLODipine (NORVASC) 10 MG tablet Take 10 mg by mouth daily.  03/11/19  Yes [provider]  cyclobenzaprine (FLEXERIL) 10 MG tablet Take 0.5-1 tablets (5-10 mg total) by mouth 2 (two) times daily as needed for muscle spasms. 06/03/19  Yes Harris, Abigail, PA-C  diclofenac sodium (VOLTAREN) 1 % GEL Apply 4 g 4 (four) times daily topically. Patient taking differently: Apply 4 g topically 4 (four) times daily as needed (pain).  09/04/17  Yes Focht, Jessica L, PA  dicyclomine (BENTYL) 20 MG tablet Take 20 mg by mouth 3 (three) times daily as needed. 09/01/19  Yes [provider]  hydrochlorothiazide (HYDRODIURIL) 12.5 MG tablet Take 3.125 mg by mouth daily.  09/01/19  Yes [provider]  LORazepam (ATIVAN) 2 MG tablet Take 2 mg by mouth 2 (two) times daily as needed. 08/26/19  Yes [provider]  Magnesium 200 MG TABS Take 400 mg by mouth daily.   Yes [provider]  Multiple Vitamin (MULTIVITAMIN WITH MINERALS) TABS Take 1 tablet by mouth daily.   Yes [provider]  omeprazole (PRILOSEC) 40 MG capsule Take 40 mg by mouth at bedtime.    Yes [provider]  oxyCODONE-acetaminophen (PERCOCET) 10-325 MG tablet Take 1 tablet by mouth 4 (four) times daily.   Yes [provider]  sildenafil (VIAGRA) 100 MG tablet Take 100 mg by mouth daily as needed for erectile dysfunction.   Yes [provider]  telmisartan (MICARDIS) 80 MG tablet Take 80 mg by mouth at bedtime.   Yes [provider]  traZODone (DESYREL) 100 MG tablet Take 75 mg by mouth at bedtime.    Yes [provider]  vortioxetine HBr (TRINTELLIX) 20 MG TABS tablet Take 1 tablet (20 mg total) by mouth daily. 11/01/19  Yes Arfeen, Arlyce Harman, MD  fluticasone (FLONASE) 50 MCG/ACT nasal spray Place 1 spray into both nostrils daily. Patient not taking: Reported on 11/12/2019 09/10/19   Jacqualine Mau, NP  lamoTRIgine (LAMICTAL) 25 MG tablet Take one tab daily for one week and than 2 tab daily Patient not taking: Reported on 11/12/2019 11/01/19   Arfeen, Arlyce Harman, MD  oxymetazoline (AFRIN NASAL SPRAY) 0.05 % nasal spray Place 1 spray into both nostrils 2 (two) times daily. Patient not taking: Reported on 11/12/2019 09/10/19   Jacqualine Mau, NP  tamsulosin (FLOMAX) 0.4 MG CAPS capsule Take 1 capsule (0.4 mg total) by mouth daily after supper. 11/12/19   Andy Moye, PA-C  simethicone (MYLICON) 80 MG chewable tablet Chew 80 mg by mouth every 6 (six) hours as needed for flatulence.  02/01/14  [provider]    Allergies    Chantix [varenicline], Morphine, and Penicillins  Review of Systems   Review of Systems  Constitutional: Negative for appetite change, chills and fever.  HENT: Negative for ear pain, rhinorrhea, sneezing and sore throat.   Eyes: Negative for photophobia and visual disturbance.  Respiratory: Negative for cough,  chest tightness, shortness of breath and wheezing.   Cardiovascular: Negative for chest pain and palpitations.  Gastrointestinal: Positive for abdominal distention, diarrhea and nausea. Negative for abdominal pain, blood in stool, constipation and vomiting.  Genitourinary: Positive for difficulty urinating and flank pain. Negative for dysuria, hematuria and urgency.  Musculoskeletal: Negative for myalgias.  Skin: Negative for rash.  Neurological: Negative for dizziness, weakness and light-headedness.    Physical Exam Updated Vital Signs BP 130/85    Pulse 87    Temp 98.1 F (36.7 C) (  Oral)    Resp 16    SpO2 100%   Physical Exam Vitals and nursing note reviewed.  Constitutional:      General: He is not in acute distress.    Appearance: He is well-developed.  HENT:     Head: Normocephalic and atraumatic.     Nose: Nose normal.  Eyes:     General: No scleral icterus.       Left eye: No discharge.     Conjunctiva/sclera: Conjunctivae normal.  Cardiovascular:     Rate and Rhythm: Normal rate and regular rhythm.     Heart sounds: Normal heart sounds. No murmur. No friction rub. No gallop.   Pulmonary:     Effort: Pulmonary effort is normal. No respiratory distress.     Breath sounds: Normal breath sounds.  Abdominal:     General: Bowel sounds are normal. There is no distension.     Palpations: Abdomen is soft.     Tenderness: There is abdominal tenderness (Right middle quadrant, right flank). There is right CVA tenderness. There is no guarding.     Comments: Several healing abdominal scars.  Musculoskeletal:        General: Normal range of motion.     Cervical back: Normal range of motion and neck supple.  Skin:    General: Skin is warm and dry.     Findings: No rash.  Neurological:     Mental Status: He is alert.     Motor: No abnormal muscle tone.     Coordination: Coordination normal.     ED Results / Procedures / Treatments   Labs (all labs ordered are listed, but  only abnormal results are displayed) Labs Reviewed  COMPREHENSIVE METABOLIC PANEL - Abnormal; Notable for the following components:      Result Value   Calcium 8.8 (*)    Total Protein 6.4 (*)    All other components within normal limits  URINALYSIS, ROUTINE W REFLEX MICROSCOPIC - Abnormal; Notable for the following components:   Hgb urine dipstick LARGE (*)    RBC / HPF >50 (*)    All other components within normal limits  CBC WITH DIFFERENTIAL/PLATELET - Abnormal; Notable for the following components:   Hemoglobin 12.1 (*)    HCT 37.6 (*)    All other components within normal limits  URINE CULTURE  LIPASE, BLOOD    EKG None  Radiology CT ABDOMEN PELVIS W CONTRAST  Result Date: 11/12/2019 CLINICAL DATA:  59 year old male with right-sided abdominal pain radiating to the back. Decreased urinary output. Diarrhea. EXAM: CT ABDOMEN AND PELVIS WITH CONTRAST TECHNIQUE: Multidetector CT imaging of the abdomen and pelvis was performed using the standard protocol following bolus administration of intravenous contrast. CONTRAST:  166mL OMNIPAQUE IOHEXOL 300 MG/ML  SOLN COMPARISON:  CT the abdomen and pelvis 11/08/2018. FINDINGS: Lower chest: Mild linear scarring in the lung bases bilaterally. Hepatobiliary: No suspicious cystic or solid hepatic lesions. No intra or extrahepatic biliary ductal dilatation. Gallbladder is normal in appearance. Pancreas: No pancreatic mass. No pancreatic ductal dilatation. No pancreatic or peripancreatic fluid collections or inflammatory changes. Spleen: Unremarkable. Adrenals/Urinary Tract: 4 mm calculus in the distal third of the right ureter (axial image 71 of series 2) with mild proximal right hydroureteronephrosis and right-sided perinephric stranding. Left kidney and left adrenal gland are normal in appearance. Status post right adrenalectomy. Urinary bladder is nearly completely decompressed, but otherwise unremarkable in appearance. Stomach/Bowel: Normal  appearance of the stomach. No pathologic dilatation of small bowel  or colon. Status post right hemicolectomy. Vascular/Lymphatic: Aortic atherosclerosis, without evidence of aneurysm or dissection in the abdominal or pelvic vasculature. No lymphadenopathy noted in the abdomen or pelvis. Reproductive: Prostate gland and seminal vesicles are unremarkable in appearance. Other: Diffuse laxity of the linea alba. postoperative changes in the right abdominal wall with defect of overlying subcutaneous fat and atrophy of the abdominal wall musculature. No significant volume of ascites. No pneumoperitoneum. Musculoskeletal: There are no aggressive appearing lytic or blastic lesions noted in the visualized portions of the skeleton. IMPRESSION: 1. 4 mm obstructive calculus in the distal third of the right ureter with mild proximal right hydroureteronephrosis and perinephric stranding. 2. Aortic atherosclerosis. 3. Additional postoperative changes and incidental findings, as above, similar to prior studies. Electronically Signed   By: Vinnie Langton M.D.   On: 11/12/2019 11:55    Procedures Procedures (including critical care time)  Medications Ordered in ED Medications  sodium chloride (PF) 0.9 % injection (0 mLs  Hold 11/12/19 1157)  sodium chloride 0.9 % bolus 1,000 mL (0 mLs Intravenous Stopped 11/12/19 1205)  fentaNYL (SUBLIMAZE) injection 50 mcg (50 mcg Intravenous Given 11/12/19 1019)  iohexol (OMNIPAQUE) 300 MG/ML solution 100 mL (100 mLs Intravenous Contrast Given 11/12/19 1147)  fentaNYL (SUBLIMAZE) injection 25 mcg (25 mcg Intravenous Given 11/12/19 1117)  ketorolac (TORADOL) 30 MG/ML injection 30 mg (30 mg Intravenous Given 11/12/19 1228)  oxyCODONE-acetaminophen (PERCOCET/ROXICET) 5-325 MG per tablet 1 tablet (1 tablet Oral Given 11/12/19 1231)  ondansetron (ZOFRAN) injection 4 mg (4 mg Intravenous Given 11/12/19 1235)    ED Course  I have reviewed the triage vital signs and the nursing  notes.  Pertinent labs & imaging results that were available during my care of the patient were reviewed by me and considered in my medical decision making (see chart for details).  Clinical Course as of Nov 11 1330  Sat Nov 12, 2019  1201 4 mm right distal ureteral stone with hydronephrosis.  CT ABDOMEN PELVIS W CONTRAST [HK]    Clinical Course User Index [HK] Delia Heady, PA-C   MDM Rules/Calculators/A&P                      59 year old male with numerous abdominal surgeries, kidney stones presenting to the ED with a chief complaint of right-sided flank pain and back pain.  Denies dysuria but notes that he has had a slow stream of urination.  Symptoms began 2 days ago.  Had diarrhea yesterday but no bowel movement today.  He is afebrile with no recent use of antipyretics.  He does take up to 15 mg Percocets at home with minimal improvement in his pain.  On exam he is overall well-appearing.  There is tenderness palpation of the right flank area and right CVA tenderness, no right lower quadrant pain, rebound or guarding.  He denies any respiratory complaints.  Work-up here significant for normal BUN/creatinine, CBC, urinalysis with hematuria.  CT renal stone study shows 4 mm distal ureteral stone with hydronephrosis.  Patient's pain controlled here in the ED.  He has pain medication and antiemetics at home so we will add on Flomax.  We will urged him to follow-up with his urologist if his symptoms persist and return to the ED for any other worsening symptoms.  Patient is hemodynamically stable, in NAD, and able to ambulate in the ED. Evaluation does not show pathology that would require ongoing emergent intervention or inpatient treatment. I explained the diagnosis to the patient. Pain  has been managed and has no complaints prior to discharge. Patient is comfortable with above plan and is stable for discharge at this time. All questions were answered prior to disposition. Strict return precautions  for returning to the ED were discussed. Encouraged follow up with PCP.   An After Visit Summary was printed and given to the patient.   Portions of this note were generated with [employer]. Dictation errors may occur despite best attempts at proofreading.  Final Clinical Impression(s) / ED Diagnoses Final diagnoses:  Ureterolithiasis    Rx / DC Orders ED Discharge Orders         Ordered    tamsulosin (FLOMAX) 0.4 MG CAPS capsule  Daily after supper     11/12/19 [place], Paras Kreider, PA-C 11/12/19 1332    Dorie Rank, MD 11/13/19 [phone]

## 2019-11-12 NOTE — Discharge Instructions (Signed)
Continue your home pain medication as previously prescribed. Take the Flomax to help with passing a kidney stone. Follow-up with your urologist if your symptoms do not improve in 4 to 5 days. Return to the ED if you start to develop a fever, chest pain, shortness of breath, lightheadedness.

## 2019-11-13 ENCOUNTER — Encounter (HOSPITAL_COMMUNITY): Payer: Self-pay | Admitting: Emergency Medicine

## 2019-11-13 ENCOUNTER — Other Ambulatory Visit: Payer: Self-pay

## 2019-11-13 ENCOUNTER — Emergency Department (HOSPITAL_COMMUNITY)
Admission: EM | Admit: 2019-11-13 | Discharge: 2019-11-13 | Disposition: A | Discharge status: Home / Self Care | Payer: Medicare HMO | Attending: Emergency Medicine | Admitting: Emergency Medicine

## 2019-11-13 DIAGNOSIS — N200 Calculus of kidney: Secondary | ICD-10-CM | POA: Diagnosis not present

## 2019-11-13 DIAGNOSIS — Z87891 Personal history of nicotine dependence: Secondary | ICD-10-CM | POA: Insufficient documentation

## 2019-11-13 DIAGNOSIS — Z79899 Other long term (current) drug therapy: Secondary | ICD-10-CM | POA: Diagnosis not present

## 2019-11-13 DIAGNOSIS — J449 Chronic obstructive pulmonary disease, unspecified: Secondary | ICD-10-CM | POA: Diagnosis not present

## 2019-11-13 DIAGNOSIS — I10 Essential (primary) hypertension: Secondary | ICD-10-CM | POA: Insufficient documentation

## 2019-11-13 DIAGNOSIS — E039 Hypothyroidism, unspecified: Secondary | ICD-10-CM | POA: Diagnosis not present

## 2019-11-13 DIAGNOSIS — R509 Fever, unspecified: Secondary | ICD-10-CM

## 2019-11-13 LAB — CBC WITH DIFFERENTIAL/PLATELET
Abs Immature Granulocytes: 0.03 10*3/uL (ref 0.00–0.07)
Basophils Absolute: 0 10*3/uL (ref 0.0–0.1)
Basophils Relative: 0 %
Eosinophils Absolute: 0.2 10*3/uL (ref 0.0–0.5)
Eosinophils Relative: 2 %
HCT: 35.2 % — ABNORMAL LOW (ref 39.0–52.0)
Hemoglobin: 11.2 g/dL — ABNORMAL LOW (ref 13.0–17.0)
Immature Granulocytes: 0 %
Lymphocytes Relative: 27 %
Lymphs Abs: 2.6 10*3/uL (ref 0.7–4.0)
MCH: 28.1 pg (ref 26.0–34.0)
MCHC: 31.8 g/dL (ref 30.0–36.0)
MCV: 88.2 fL (ref 80.0–100.0)
Monocytes Absolute: 1 10*3/uL (ref 0.1–1.0)
Monocytes Relative: 10 %
Neutro Abs: 5.8 10*3/uL (ref 1.7–7.7)
Neutrophils Relative %: 61 %
Platelets: 258 10*3/uL (ref 150–400)
RBC: 3.99 MIL/uL — ABNORMAL LOW (ref 4.22–5.81)
RDW: 14.5 % (ref 11.5–15.5)
WBC: 9.6 10*3/uL (ref 4.0–10.5)
nRBC: 0 % (ref 0.0–0.2)

## 2019-11-13 LAB — URINALYSIS, ROUTINE W REFLEX MICROSCOPIC
Bacteria, UA: NONE SEEN
Bilirubin Urine: NEGATIVE
Glucose, UA: NEGATIVE mg/dL
Ketones, ur: NEGATIVE mg/dL
Leukocytes,Ua: NEGATIVE
Nitrite: NEGATIVE
Protein, ur: NEGATIVE mg/dL
Specific Gravity, Urine: 1.01 (ref 1.005–1.030)
pH: 5 (ref 5.0–8.0)

## 2019-11-13 LAB — BASIC METABOLIC PANEL
Anion gap: 9 (ref 5–15)
BUN: 18 mg/dL (ref 6–20)
CO2: 23 mmol/L (ref 22–32)
Calcium: 8.7 mg/dL — ABNORMAL LOW (ref 8.9–10.3)
Chloride: 108 mmol/L (ref 98–111)
Creatinine, Ser: 1.71 mg/dL — ABNORMAL HIGH (ref 0.61–1.24)
GFR calc Af Amer: 50 mL/min — ABNORMAL LOW (ref >60)
GFR calc non Af Amer: 43 mL/min — ABNORMAL LOW (ref >60)
Glucose, Bld: 97 mg/dL (ref 70–99)
Potassium: 4 mmol/L (ref 3.5–5.1)
Sodium: 140 mmol/L (ref 135–145)

## 2019-11-13 MED ORDER — KETOROLAC TROMETHAMINE 60 MG/2ML IM SOLN: 60.0000 mg | Freq: Once | INTRAMUSCULAR | Status: DC

## 2019-11-13 MED ORDER — KETOROLAC TROMETHAMINE 30 MG/ML IJ SOLN
30.0000 mg | Freq: Once | INTRAMUSCULAR | Status: AC
  Administered 2019-11-13: 30 mg via INTRAVENOUS
  Filled 2019-11-13: qty 1

## 2019-11-13 NOTE — Discharge Instructions (Addendum)
Continue medications as previously prescribed.  Follow-up with [facility] if you have not passed the stone in the next few days.  Their contact information has been provided in this discharge summary for you to call and make these arrangements.

## 2019-11-13 NOTE — ED Triage Notes (Signed)
Per pt, states he was here yesterday for kidney stone-right flank pain-was told to come back if he had a fever-states he had one this am-states he is nauseated and still having a slow flow

## 2019-11-13 NOTE — ED Provider Notes (Signed)
[facility] DEPT Provider Note   CSN: [id number] Arrival date & time: 11/13/19  [id number]     History Chief Complaint  Patient presents with  . Fever    [name] Nimtz Sr. is a 59 y.o. male.  Patient is a 59 year old male with past medical history of hypertension, renal calculi, anemia, anxiety.  He presents today for evaluation of fever.  He was diagnosed yesterday with a renal calculus in the distal third of the ureter.  He was discharged with pain medicine and told return if he becomes febrile.  Patient states this morning he felt warm and checked his temperature.  It was 102.  He denies cough, nausea, vomiting, or dysuria.  He does report urinary urgency.  The history is provided by the patient.  Fever Max temp prior to arrival:  102 Temp source:  Tympanic Severity:  Moderate Onset quality:  Sudden Timing:  Constant Progression:  Worsening Chronicity:  New Relieved by:  Nothing Worsened by:  Nothing Ineffective treatments:  None tried Associated symptoms: no chills and no dysuria        Past Medical History:  Diagnosis Date  . Anemia   . Anxiety   . Arthritis    neck, shoulder, left knee  . 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 Erythrocytosis 11/30/2014  . Essential hypertension   . GERD (gastroesophageal reflux disease)   . Headache(784.0)    2 x/week  . History of kidney stones   . Hypothyroidism    hx of   . Leukocytosis, unspecified 11/03/2013  . Limited joint range of motion    cervical fusion 02/2013  . Medial meniscus tear 06/2013   left knee  . 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 Throat and mouth symptom    itchy throat x 3 weeks  . Thyroid nodule   . Tobacco abuse   . Wears partial dentures    upper    Patient Active Problem List   Diagnosis Date Noted  . PVC (premature ventricular contraction)   . Anxiety state 08/14/2017  . Hypomagnesemia 06/28/2017  . Hypothyroidism   .  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 Protein-calorie malnutrition, severe ([place]) 06/27/2017  . 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 Pressure injury of skin 05/29/2017  . Ileus ([place])   . Hypokalemia 05/14/2017  . Anastomotic leak of intestine 05/14/2017  . Wound dehiscence, surgical 05/14/2017  . Aspiration pneumonia ([place]) 05/14/2017  . Chronic narcotic use 05/10/2017  . Generalized anxiety disorder 05/10/2017  . Intra-abdominal adhesions s/p SB resection 05/06/2017 05/09/2017  . Throat symptom 03/18/2016  . Multinodular goiter 01/19/2016  . Hyperthyroidism 01/19/2016  . Essential hypertension   . Diarrhea 11/30/2014  . Anemia, iron deficiency 11/01/2014  . Leukocytosis 11/03/2013  . Tobacco abuse 07/26/2013  . COPD (chronic obstructive pulmonary disease) ([place]) 07/26/2013  . Left knee pain 07/26/2013  . Pain in joint, shoulder region 05/03/2013  . GERD 07/24/2008  . 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 ABDOMINAL SURGERY    . ANTERIOR CERVICAL DECOMP/DISCECTOMY FUSION N/A 03/18/2013   Procedure: ANTERIOR CERVICAL DECOMPRESSION/DISCECTOMY FUSION 3 LEVELS;  Surgeon: Erline Levine, MD;  Location: [facility];  Service: Neurosurgery;  Laterality: N/A;  Anterior Cervical Three-Six  Decompression/Diskectomy/Fusion  . APPENDECTOMY    . CIRCUMCISION  05/04/2006  . COLONOSCOPY    . CYSTOSCOPY/URETEROSCOPY/HOLMIUM LASER/STENT PLACEMENT Left  11/24/2018   Procedure: CYSTOSCOPY/URETEROSCOPY/HOLMIUM LASER/STENT PLACEMENT;  Surgeon: Lucas Mallow, MD;  Location: [facility];  Service: Urology;  Laterality: Left;  . EYE SURGERY     bilateral cataract surgery   . HEMICOLECTOMY Right [id number]   with ileocolic anastomosis  . INGUINAL HERNIA REPAIR    . INSERTION OF MESH N/A 07/31/2017   Procedure: INSERTION OF MESH;  Surgeon: Johnathan Hausen,  MD;  Location: [facility];  Service: General;  Laterality: N/A;  . KNEE ARTHROSCOPY Left 08/01/2013   Procedure: LEFT ARTHROSCOPY KNEE;  Surgeon: Kerin Salen, MD;  Location: [facility];  Service: Orthopedics;  Laterality: Left;  . LAPAROSCOPIC ADRENALECTOMY Right 05/06/2017   Procedure: OPEN RIGHT ADRENALECTOMY WITH SMALL BOWEL RESECTION AND ANASTOMOSIS;  Surgeon: Kieth Brightly, Arta Bruce, MD;  Location: [facility];  Service: General;  Laterality: Right;  . LAPAROSCOPIC LYSIS OF ADHESIONS N/A 07/31/2017   Procedure: LYSIS OF ADHESIONS;  Surgeon: Johnathan Hausen, MD;  Location: [facility];  Service: General;  Laterality: N/A;  . LAPAROSCOPY N/A 07/31/2017   Procedure: LAPAROSCOPY DIAGNOSTIC;  Surgeon: Johnathan Hausen, MD;  Location: [facility];  Service: General;  Laterality: N/A;  . LAPAROTOMY N/A 05/13/2017   Procedure: EXPLORATORY LAPAROTOMY, with wound dehisense, and repair of anastamotic leak;  Surgeon: Mickeal Skinner, MD;  Location: [facility];  Service: General;  Laterality: N/A;  . LAPAROTOMY N/A 05/14/2017   Procedure: EXPLORATORY LAPAROTOMY, LYSIS OF ADHESIONS/ SMALL BOWEL RESECTION/ WASHOUT OF ABDOMEN AND PLACEMENT OF NEGATIVE PRESSURE DRESSING;  Surgeon: Kieth Brightly, Arta Bruce, MD;  Location: [facility];  Service: General;  Laterality: N/A;  . LAPAROTOMY N/A 05/17/2017   Procedure: EXPLORATORY LAPAROTOMY, PLACEMENT OF ILEOSTOMY TUBES, PARTIAL CLOSURE OF FASCIA, WOUND VAC EXCHANGE;  Surgeon: [name], Arta Bruce, MD;  Location: [facility];  Service: General;  Laterality: N/A;  . LAPAROTOMY N/A 05/20/2017   Procedure: ABDOMINAL WOUND [place] OUT, WOUND CLOSURE AND WOUND VAC PLACEMENT;  Surgeon: [name], Arta Bruce, MD;  Location: [facility];  Service: General;  Laterality: N/A;  . LAPAROTOMY N/A 07/31/2017   Procedure: EXPLORATORY LAPAROTOMY WITH CLOSURE OF ENTEROTOMICS;  Surgeon: Johnathan Hausen, MD;  Location: [facility];  Service: General;  Laterality: N/A;  . SHOULDER ARTHROSCOPY W/ ROTATOR CUFF REPAIR Left  07/16/2010  . SMALL INTESTINE SURGERY  04/24/2000   adhesiolysis, ileocolic anastomosis  . UPPER GASTROINTESTINAL ENDOSCOPY         Family History  Problem Relation Age of Onset  . Cancer Father 36       colon ca - deceased.  . Colon cancer Father   . Cancer Sister 82       breast ca  . Thyroid disease Neg Hx     Social History   Tobacco Use  . Smoking status: Former Smoker    Packs/day: 1.00    Years: 25.00    Pack years: 25.00    Types: Cigarettes    Quit date: 2017    Years since quitting: 4.0  . Smokeless tobacco: Never Used  . Tobacco comment: quit smoking 3 years ago  Substance Use Topics  . Alcohol use: Yes    Comment: occasionally  . Drug use: No    Home Medications Prior to Admission medications   Medication Sig Start Date End Date Taking? Authorizing Provider  acetaminophen (TYLENOL) 500 MG tablet Take 1,000 mg by mouth every 6 (six) hours as needed for mild pain.   Yes [provider]  amLODipine (NORVASC) 10 MG tablet Take 10 mg  by mouth daily.  03/11/19  Yes [provider]  cyclobenzaprine (FLEXERIL) 10 MG tablet Take 0.5-1 tablets (5-10 mg total) by mouth 2 (two) times daily as needed for muscle spasms. 06/03/19  Yes Harris, Abigail, PA-C  diclofenac sodium (VOLTAREN) 1 % GEL Apply 4 g 4 (four) times daily topically. Patient taking differently: Apply 4 g topically 4 (four) times daily as needed (pain).  09/04/17  Yes Focht, Jessica L, PA  dicyclomine (BENTYL) 20 MG tablet Take 20 mg by mouth 3 (three) times daily as needed. 09/01/19  Yes [provider]  hydrochlorothiazide (HYDRODIURIL) 12.5 MG tablet Take 3.125 mg by mouth daily.  09/01/19  Yes [provider]  LORazepam (ATIVAN) 2 MG tablet Take 2 mg by mouth 2 (two) times daily as needed. 08/26/19  Yes [provider]  Magnesium 200 MG TABS Take 400 mg by mouth daily.   Yes [provider]  Multiple Vitamin (MULTIVITAMIN WITH MINERALS) TABS Take 1 tablet  by mouth daily.   Yes [provider]  omeprazole (PRILOSEC) 40 MG capsule Take 40 mg by mouth at bedtime.    Yes [provider]  oxyCODONE-acetaminophen (PERCOCET) 10-325 MG tablet Take 1 tablet by mouth 4 (four) times daily.   Yes [provider]  sildenafil (VIAGRA) 100 MG tablet Take 100 mg by mouth daily as needed for erectile dysfunction.   Yes [provider]  tamsulosin (FLOMAX) 0.4 MG CAPS capsule Take 1 capsule (0.4 mg total) by mouth daily after supper. 11/12/19  Yes Khatri, Hina, PA-C  telmisartan (MICARDIS) 80 MG tablet Take 80 mg by mouth at bedtime.   Yes [provider]  traZODone (DESYREL) 100 MG tablet Take 75 mg by mouth at bedtime.    Yes [provider]  vortioxetine HBr (TRINTELLIX) 20 MG TABS tablet Take 1 tablet (20 mg total) by mouth daily. 11/01/19  Yes Arfeen, Arlyce Harman, MD  fluticasone (FLONASE) 50 MCG/ACT nasal spray Place 1 spray into both nostrils daily. Patient not taking: Reported on 11/12/2019 09/10/19   Jacqualine Mau, NP  lamoTRIgine (LAMICTAL) 25 MG tablet Take one tab daily for one week and than 2 tab daily Patient not taking: Reported on 11/12/2019 11/01/19   Arfeen, Arlyce Harman, MD  oxymetazoline (AFRIN NASAL SPRAY) 0.05 % nasal spray Place 1 spray into both nostrils 2 (two) times daily. Patient not taking: Reported on 11/12/2019 09/10/19   Jacqualine Mau, NP  simethicone (MYLICON) 80 MG chewable tablet Chew 80 mg by mouth every 6 (six) hours as needed for flatulence.  02/01/14  [provider]    Allergies    Chantix [varenicline], Morphine, and Penicillins  Review of Systems   Review of Systems  Constitutional: Positive for fever. Negative for chills.  Genitourinary: Negative for dysuria.  All other systems reviewed and are negative.   Physical Exam Updated Vital Signs BP (!) 143/86 (BP Location: Right Arm)   Pulse 97   Temp 99.9 F (37.7 C) (Oral)   Resp 18   SpO2 97%   Physical  Exam Vitals and nursing note reviewed.  Constitutional:      General: He is not in acute distress.    Appearance: He is well-developed. He is not diaphoretic.  HENT:     Head: Normocephalic and atraumatic.  Cardiovascular:     Rate and Rhythm: Normal rate and regular rhythm.     Heart sounds: No murmur. No friction rub.  Pulmonary:     Effort: Pulmonary effort  is normal. No respiratory distress.     Breath sounds: Normal breath sounds. No wheezing or rales.  Abdominal:     General: Bowel sounds are normal. There is no distension.     Palpations: Abdomen is soft.     Tenderness: There is no abdominal tenderness. There is right CVA tenderness. There is no left CVA tenderness, guarding or rebound.  Musculoskeletal:        General: Normal range of motion.     Cervical back: Normal range of motion and neck supple.  Skin:    General: Skin is warm and dry.  Neurological:     Mental Status: He is alert and oriented to person, place, and time.     Coordination: Coordination normal.     ED Results / Procedures / Treatments   Labs (all labs ordered are listed, but only abnormal results are displayed) Labs Reviewed  BASIC METABOLIC PANEL  CBC WITH DIFFERENTIAL/PLATELET  URINALYSIS, ROUTINE W REFLEX MICROSCOPIC    EKG None  Radiology CT ABDOMEN PELVIS W CONTRAST  Result Date: 11/12/2019 CLINICAL DATA:  59 year old male with right-sided abdominal pain radiating to the back. Decreased urinary output. Diarrhea. EXAM: CT ABDOMEN AND PELVIS WITH CONTRAST TECHNIQUE: Multidetector CT imaging of the abdomen and pelvis was performed using the standard protocol following bolus administration of intravenous contrast. CONTRAST:  136mL OMNIPAQUE IOHEXOL 300 MG/ML  SOLN COMPARISON:  CT the abdomen and pelvis 11/08/2018. FINDINGS: Lower chest: Mild linear scarring in the lung bases bilaterally. Hepatobiliary: No suspicious cystic or solid hepatic lesions. No intra or extrahepatic biliary ductal  dilatation. Gallbladder is normal in appearance. Pancreas: No pancreatic mass. No pancreatic ductal dilatation. No pancreatic or peripancreatic fluid collections or inflammatory changes. Spleen: Unremarkable. Adrenals/Urinary Tract: 4 mm calculus in the distal third of the right ureter (axial image 71 of series 2) with mild proximal right hydroureteronephrosis and right-sided perinephric stranding. Left kidney and left adrenal gland are normal in appearance. Status post right adrenalectomy. Urinary bladder is nearly completely decompressed, but otherwise unremarkable in appearance. Stomach/Bowel: Normal appearance of the stomach. No pathologic dilatation of small bowel or colon. Status post right hemicolectomy. Vascular/Lymphatic: Aortic atherosclerosis, without evidence of aneurysm or dissection in the abdominal or pelvic vasculature. No lymphadenopathy noted in the abdomen or pelvis. Reproductive: Prostate gland and seminal vesicles are unremarkable in appearance. Other: Diffuse laxity of the linea alba. postoperative changes in the right abdominal wall with defect of overlying subcutaneous fat and atrophy of the abdominal wall musculature. No significant volume of ascites. No pneumoperitoneum. Musculoskeletal: There are no aggressive appearing lytic or blastic lesions noted in the visualized portions of the skeleton. IMPRESSION: 1. 4 mm obstructive calculus in the distal third of the right ureter with mild proximal right hydroureteronephrosis and perinephric stranding. 2. Aortic atherosclerosis. 3. Additional postoperative changes and incidental findings, as above, similar to prior studies. Electronically Signed   By: Vinnie Langton M.D.   On: 11/12/2019 11:55    Procedures Procedures (including critical care time)  Medications Ordered in ED Medications  ketorolac (TORADOL) injection 60 mg (has no administration in time range)    ED Course  I have reviewed the triage vital signs and the nursing  notes.  Pertinent labs & imaging results that were available during my care of the patient were reviewed by me and considered in my medical decision making (see chart for details).    MDM Rules/Calculators/A&P  Patient presents here with complaints of fever after being diagnosed yesterday with a renal  calculus.  He states he had a temperature this morning of 102, however is now 99.9.  Patient is nontoxic-appearing, has clear urine, and has a normal white count which is actually lower than yesterday's value.  At this point, I see no evidence that the stone has become infected and needs to be emergently removed.  Patient has no other complaints that would explain his fever.  At this point, I feel as though patient is appropriate for discharge.  He is to follow-up with [facility] if he has not passed the stone in the next few days.  Final Clinical Impression(s) / ED Diagnoses Final diagnoses:  None    Rx / DC Orders ED Discharge Orders    None       Veryl Speak, MD 11/13/19 1212

## 2019-11-13 NOTE — ED Notes (Signed)
Urine culture sent down to lab with urinalysis. 

## 2019-11-14 LAB — URINE CULTURE: Culture: NO GROWTH

## 2019-11-17 DIAGNOSIS — R3121 Asymptomatic microscopic hematuria: Secondary | ICD-10-CM | POA: Diagnosis not present

## 2019-11-17 DIAGNOSIS — N201 Calculus of ureter: Secondary | ICD-10-CM | POA: Diagnosis not present

## 2019-11-18 ENCOUNTER — Encounter: Payer: Self-pay | Admitting: Gastroenterology

## 2019-11-21 ENCOUNTER — Telehealth (HOSPITAL_COMMUNITY): Payer: Self-pay | Admitting: *Deleted

## 2019-11-21 NOTE — Telephone Encounter (Signed)
Pt called requesting samples of Trintellix 20mg . Writer will have samples available for pick up tomorrow.

## 2019-11-24 ENCOUNTER — Encounter: Payer: Medicare HMO | Admitting: Psychology

## 2019-11-24 DIAGNOSIS — N201 Calculus of ureter: Secondary | ICD-10-CM | POA: Diagnosis not present

## 2019-11-24 DIAGNOSIS — N23 Unspecified renal colic: Secondary | ICD-10-CM | POA: Diagnosis not present

## 2019-12-08 DIAGNOSIS — N23 Unspecified renal colic: Secondary | ICD-10-CM | POA: Diagnosis not present

## 2019-12-08 DIAGNOSIS — N201 Calculus of ureter: Secondary | ICD-10-CM | POA: Diagnosis not present

## 2019-12-12 ENCOUNTER — Encounter (HOSPITAL_COMMUNITY): Payer: Self-pay | Admitting: Psychiatry

## 2019-12-12 ENCOUNTER — Other Ambulatory Visit: Payer: Self-pay

## 2019-12-12 ENCOUNTER — Ambulatory Visit (INDEPENDENT_AMBULATORY_CARE_PROVIDER_SITE_OTHER): Payer: Medicare HMO | Admitting: Psychiatry

## 2019-12-12 DIAGNOSIS — F411 Generalized anxiety disorder: Secondary | ICD-10-CM | POA: Diagnosis not present

## 2019-12-12 DIAGNOSIS — F410 Panic disorder [episodic paroxysmal anxiety] without agoraphobia: Secondary | ICD-10-CM

## 2019-12-12 MED ORDER — GABAPENTIN 100 MG PO CAPS: 100.0000 mg | ORAL_CAPSULE | Freq: Two times a day (BID) | ORAL | 1 refills | Status: DC

## 2019-12-12 NOTE — Progress Notes (Signed)
Virtual Visit via Telephone Note  I connected with [name] [name]. on 12/12/19 at  9:00 AM EST by telephone and verified that I am speaking with the correct person using two identifiers.   I discussed the limitations, risks, security and privacy concerns of performing an evaluation and management service by telephone and the availability of in person appointments. I also discussed with the patient that there may be a patient responsible charge related to this service. The patient expressed understanding and agreed to proceed.   History of Present Illness: Patient was evaluated by phone session.  He was recently seen in the emergency room because of renal stone.  Patient told his stone was passed last week.  He has a creatinine of 1.71.  He tried Lamictal but he felt it make him nauseous and he stopped after taking 2 doses.  He missed the appointment of Dr. Hall Busing because same day he was in the ER.  Now he has second appointment in May but he like to have some earlier appointment and okay to see a different therapist.  He is trying to cut down his lorazepam and now he is taking half tablet in the morning and 1 at bedtime to a total dose of lorazepam 1.5 mg.  He is also taking trazodone 50 mg it is helping with sleep.  Patient is on disability.  Denies any highs and lows, crying spells, feeling of hopelessness or worthlessness.  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  He admitted a lot of anxiety because some time he does not get Trintellix samples on time.  He cannot afford and like to have samples on time.  He is willing to try a different medication to help his anxiety since Lamictal did not work.    Past psychiatric history; H/Oanxiety since early 77s. 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 No h/omania, psychosis, suicidal attempt, inpatient treatment. Seen and managed byPCPsince early 26s. Tried Remeron, hydroxyzine, gabapentin,  Paxil, Prozac, Zoloft and Lamictal. Stopped due to tremors or sexual side effects.  Recent Results (from the past 2160 hour(s))  Lipase, blood     Status: None   Collection Time: 11/12/19  9:51 AM  Result Value Ref Range   Lipase 20 11 - 51 U/L    Comment: Performed at [facility], [place] [address], [place], [place] [address]  Comprehensive metabolic panel     Status: Abnormal   Collection Time: 11/12/19  9:51 AM  Result Value Ref Range   Sodium 140 135 - 145 mmol/L   Potassium 3.5 3.5 - 5.1 mmol/L   Chloride 108 98 - 111 mmol/L   CO2 22 22 - 32 mmol/L   Glucose, Bld 90 70 - 99 mg/dL   BUN 13 6 - 20 mg/dL   Creatinine, Ser 1.11 0.61 - 1.24 mg/dL   Calcium 8.8 (L) 8.9 - 10.3 mg/dL   Total Protein 6.4 (L) 6.5 - 8.1 g/dL   Albumin 3.7 3.5 - 5.0 g/dL   AST 28 15 - 41 U/L   ALT 29 0 - 44 U/L   Alkaline Phosphatase 72 38 - 126 U/L   Total Bilirubin 0.7 0.3 - 1.2 mg/dL   GFR calc non Af Amer >60 >60 mL/min   GFR calc Af Amer >60 >60 mL/min   Anion gap 10 5 - 15    Comment: Performed at [facility], [place]  [address] [place], [place] [address]  CBC with Differential     Status: Abnormal   Collection Time: 11/12/19  9:58 AM  Result Value Ref Range   WBC 10.2 4.0 - 10.5 K/uL   RBC 4.28 4.22 - 5.81 MIL/uL   Hemoglobin 12.1 (L) 13.0 - 17.0 g/dL   HCT 37.6 (L) 39.0 - 52.0 %   MCV 87.9 80.0 - 100.0 fL   MCH 28.3 26.0 - 34.0 pg   MCHC 32.2 30.0 - 36.0 g/dL   RDW 14.6 11.5 - 15.5 %   Platelets 274 150 - 400 K/uL   nRBC 0.0 0.0 - 0.2 %   Neutrophils Relative % 63 %   Neutro Abs 6.3 1.7 - 7.7 K/uL   Lymphocytes Relative 25 %   Lymphs Abs 2.6 0.7 - 4.0 K/uL   Monocytes Relative 10 %   Monocytes Absolute 1.0 0.1 - 1.0 K/uL   Eosinophils Relative 2 %   Eosinophils Absolute 0.2 0.0 - 0.5 K/uL   Basophils Relative 0 %   Basophils Absolute 0.0 0.0 - 0.1 K/uL   Immature Granulocytes 0 %   Abs Immature Granulocytes 0.04 0.00 - 0.07 K/uL    Comment:  Performed at [facility], [place] [address] [place], [place] [address]  Urinalysis, Routine w reflex microscopic     Status: Abnormal   Collection Time: 11/12/19 11:19 AM  Result Value Ref Range   Color, Urine YELLOW YELLOW   APPearance CLEAR CLEAR   Specific Gravity, Urine 1.017 1.005 - 1.030   pH 5.0 5.0 - 8.0   Glucose, UA NEGATIVE NEGATIVE mg/dL   Hgb urine dipstick LARGE (A) NEGATIVE   Bilirubin Urine NEGATIVE NEGATIVE   Ketones, ur NEGATIVE NEGATIVE mg/dL   Protein, ur NEGATIVE NEGATIVE mg/dL   Nitrite NEGATIVE NEGATIVE   Leukocytes,Ua NEGATIVE NEGATIVE   RBC / HPF >50 (H) 0 - 5 RBC/hpf   WBC, UA 0-5 0 - 5 WBC/hpf   Bacteria, UA NONE SEEN NONE SEEN   Mucus PRESENT     Comment: Performed at [facility], [place] [address], [place], [place] [address]  Urine culture     Status: None   Collection Time: 11/12/19 11:19 AM   Specimen: Urine, Random  Result Value Ref Range   Specimen Description      URINE, RANDOM Performed at [place] [address], [place], [place] [address]    Special Requests      NONE Performed at [facility], [place] [address], [place], [place] [address]    Culture      NO GROWTH Performed at [facility], [place] [address] [place], [place] [address]    Report Status 11/14/2019 FINAL   Basic metabolic panel     Status: Abnormal   Collection Time: 11/13/19  9:57 AM  Result Value Ref Range   Sodium 140 135 - 145 mmol/L   Potassium 4.0 3.5 - 5.1 mmol/L   Chloride 108 98 - 111 mmol/L   CO2 23 22 - 32 mmol/L   Glucose, Bld 97 70 - 99 mg/dL   BUN 18 6 - 20 mg/dL   Creatinine, Ser 1.71 (H) 0.61 - 1.24 mg/dL   Calcium 8.7 (L) 8.9 - 10.3 mg/dL   GFR calc non Af Amer 43 (L) >60 mL/min   GFR calc Af Amer 50 (L) >60 mL/min   Anion gap 9 5 - 15    Comment: Performed at [facility], [place] [address], [place], [place] [address]  CBC with Differential     Status:  Abnormal   Collection Time: 11/13/19  9:57 AM  Result Value Ref Range   WBC 9.6 4.0 - 10.5 K/uL   RBC 3.99 (L) 4.22 - 5.81 MIL/uL   Hemoglobin 11.2 (L) 13.0 - 17.0 g/dL   HCT 35.2 (L) 39.0 - 52.0 %   MCV 88.2 80.0 - 100.0 fL   MCH 28.1 26.0 - 34.0 pg   MCHC 31.8 30.0 - 36.0 g/dL   RDW 14.5 11.5 - 15.5 %   Platelets 258 150 - 400 K/uL   nRBC 0.0 0.0 - 0.2 %   Neutrophils Relative % 61 %   Neutro Abs 5.8 1.7 - 7.7 K/uL   Lymphocytes Relative 27 %   Lymphs Abs 2.6 0.7 - 4.0 K/uL   Monocytes Relative 10 %   Monocytes Absolute 1.0 0.1 - 1.0 K/uL   Eosinophils Relative 2 %   Eosinophils Absolute 0.2 0.0 - 0.5 K/uL   Basophils Relative 0 %   Basophils Absolute 0.0 0.0 - 0.1 K/uL   Immature Granulocytes 0 %   Abs Immature Granulocytes 0.03 0.00 - 0.07 K/uL    Comment: Performed at [facility], [place] [address], [place], [place] [address]  Urinalysis, Routine w reflex microscopic     Status: Abnormal   Collection Time: 11/13/19 10:07 AM  Result Value Ref Range   Color, Urine YELLOW YELLOW   APPearance CLEAR CLEAR   Specific Gravity, Urine 1.010 1.005 - 1.030   pH 5.0 5.0 - 8.0   Glucose, UA NEGATIVE NEGATIVE mg/dL   Hgb urine dipstick SMALL (A) NEGATIVE   Bilirubin Urine NEGATIVE NEGATIVE   Ketones, ur NEGATIVE NEGATIVE mg/dL   Protein, ur NEGATIVE NEGATIVE mg/dL   Nitrite NEGATIVE NEGATIVE   Leukocytes,Ua NEGATIVE NEGATIVE   RBC / HPF 0-5 0 - 5 RBC/hpf   WBC, UA 0-5 0 - 5 WBC/hpf   Bacteria, UA NONE SEEN NONE SEEN   Mucus PRESENT     Comment: Performed at [facility], [place] [address] [place],  [address]       Psychiatric Specialty Exam: Physical Exam  Review of Systems  There were no vitals taken for this visit.There is no height or weight on file to calculate BMI.  General Appearance: NA  Eye Contact:  NA  Speech:  Clear and Coherent  Volume:  Normal  Mood:  Anxious  Affect:  NA  Thought Process:  Goal Directed   Orientation:  Full (Time, Place, and Person)  Thought Content:  WDL  Suicidal Thoughts:  No  Homicidal Thoughts:  No  Memory:  Immediate;   Good Recent;   Good Remote;   Good  Judgement:  Fair  Insight:  Present  Psychomotor Activity:  NA  Concentration:  Concentration: Fair and Attention Span: Fair  Recall:  Good  Fund of Knowledge:  Good  Language:  Good  Akathisia:  No  Handed:  Right  AIMS (if indicated):     Assets:  Communication Skills Desire for Improvement Housing  ADL's:  Intact  Cognition:  WNL  Sleep:   good      Assessment and Plan: Generalized anxiety disorder.  Panic attack.  Discontinue Lamictal as patient reported having nausea.  I also discussed it could be due to renal stone but patient reluctant to go back on Lamictal.  He is open to try a different medication.  We  discussed about gabapentin and after some discussion he agreed to give a try.  He recalls taking gabapentin in the past but do not remember the details.  We will start gabapentin 100 mg twice a day.  We will also try a different therapist so we can add an earlier appointment.  Patient is trying to cut down his lorazepam and not taking 1.5 mg a day and trazodone 50 mg.  Both medicine prescribed by PCP.  I also recommend he should have another blood work since his creatinine is 1.71.  Recommended to call us back if is any question or any concern.  Follow-up in 6 weeks.  Follow Up Instructions:    I discussed the assessment and treatment plan with the patient. The patient was provided an opportunity to ask questions and all were answered. The patient agreed with the plan and demonstrated an understanding of the instructions.   The patient was advised to call back or seek an in-person evaluation if the symptoms worsen or if the condition fails to improve as anticipated.  I provided 20 minutes of non-face-to-face time during this encounter.   [name], MD

## 2019-12-19 DIAGNOSIS — Z79899 Other long term (current) drug therapy: Secondary | ICD-10-CM | POA: Diagnosis not present

## 2019-12-19 DIAGNOSIS — L309 Dermatitis, unspecified: Secondary | ICD-10-CM | POA: Diagnosis not present

## 2019-12-19 DIAGNOSIS — F411 Generalized anxiety disorder: Secondary | ICD-10-CM | POA: Diagnosis not present

## 2019-12-19 DIAGNOSIS — I10 Essential (primary) hypertension: Secondary | ICD-10-CM | POA: Diagnosis not present

## 2019-12-19 DIAGNOSIS — F410 Panic disorder [episodic paroxysmal anxiety] without agoraphobia: Secondary | ICD-10-CM | POA: Diagnosis not present

## 2019-12-19 DIAGNOSIS — Z1211 Encounter for screening for malignant neoplasm of colon: Secondary | ICD-10-CM | POA: Diagnosis not present

## 2019-12-27 DIAGNOSIS — M79671 Pain in right foot: Secondary | ICD-10-CM | POA: Diagnosis not present

## 2019-12-27 DIAGNOSIS — M5416 Radiculopathy, lumbar region: Secondary | ICD-10-CM | POA: Diagnosis not present

## 2019-12-27 DIAGNOSIS — Z6829 Body mass index (BMI) 29.0-29.9, adult: Secondary | ICD-10-CM | POA: Diagnosis not present

## 2019-12-27 DIAGNOSIS — M4722 Other spondylosis with radiculopathy, cervical region: Secondary | ICD-10-CM | POA: Diagnosis not present

## 2019-12-27 DIAGNOSIS — I10 Essential (primary) hypertension: Secondary | ICD-10-CM | POA: Diagnosis not present

## 2019-12-29 DIAGNOSIS — L821 Other seborrheic keratosis: Secondary | ICD-10-CM | POA: Diagnosis not present

## 2019-12-29 DIAGNOSIS — L239 Allergic contact dermatitis, unspecified cause: Secondary | ICD-10-CM | POA: Diagnosis not present

## 2019-12-29 DIAGNOSIS — L720 Epidermal cyst: Secondary | ICD-10-CM | POA: Diagnosis not present

## 2020-01-05 DIAGNOSIS — N201 Calculus of ureter: Secondary | ICD-10-CM | POA: Diagnosis not present

## 2020-01-07 ENCOUNTER — Ambulatory Visit: Payer: Medicare HMO | Attending: Internal Medicine

## 2020-01-07 DIAGNOSIS — Z23 Encounter for immunization: Secondary | ICD-10-CM

## 2020-01-07 NOTE — Progress Notes (Signed)
   [id number]  Name:  [name] Dashnaw Sr.    MRN: [id number] DOB: 06/27/1961  01/07/2020  [name] [name] was observed post Covid-19 immunization for 15 minutes without incident. He was provided with Vaccine Information Sheet and instruction to access the V-Safe system.   [name] [name] was instructed to call 911 with any severe reactions post vaccine: Marland Kitchen Difficulty breathing  . Swelling of face and throat  . A fast heartbeat  . A bad rash all over body  . Dizziness and weakness   Immunizations Administered    Name Date Dose VIS Date Route   Pfizer COVID-19 Vaccine 01/07/2020  1:19 PM 0.3 mL 10/07/2019 Intramuscular   Manufacturer: [place]   Lot: [id number]   [place]: [id number]

## 2020-01-11 ENCOUNTER — Encounter (HOSPITAL_COMMUNITY): Payer: Self-pay | Admitting: *Deleted

## 2020-01-11 ENCOUNTER — Inpatient Hospital Stay: Admit: 2020-01-11 | Payer: Medicare HMO | Admitting: Neurological Surgery

## 2020-01-11 ENCOUNTER — Other Ambulatory Visit: Payer: Self-pay

## 2020-01-11 ENCOUNTER — Emergency Department (HOSPITAL_COMMUNITY): Payer: Medicare HMO | Admitting: Anesthesiology

## 2020-01-11 ENCOUNTER — Encounter (HOSPITAL_COMMUNITY)
Admission: EM | Disposition: A | Discharge status: Home / Self Care | Payer: Self-pay | Source: Home / Self Care | Attending: Emergency Medicine

## 2020-01-11 ENCOUNTER — Observation Stay (HOSPITAL_COMMUNITY)
Admission: EM | Admit: 2020-01-11 | Discharge: 2020-01-13 | Disposition: A | Discharge status: Home / Self Care | Payer: Medicare HMO | Attending: Neurological Surgery | Admitting: Neurological Surgery

## 2020-01-11 ENCOUNTER — Emergency Department (HOSPITAL_COMMUNITY): Payer: Medicare HMO

## 2020-01-11 DIAGNOSIS — Z880 Allergy status to penicillin: Secondary | ICD-10-CM | POA: Insufficient documentation

## 2020-01-11 DIAGNOSIS — Z981 Arthrodesis status: Secondary | ICD-10-CM | POA: Diagnosis not present

## 2020-01-11 DIAGNOSIS — M5116 Intervertebral disc disorders with radiculopathy, lumbar region: Principal | ICD-10-CM | POA: Insufficient documentation

## 2020-01-11 DIAGNOSIS — Z79899 Other long term (current) drug therapy: Secondary | ICD-10-CM | POA: Insufficient documentation

## 2020-01-11 DIAGNOSIS — Z87891 Personal history of nicotine dependence: Secondary | ICD-10-CM | POA: Insufficient documentation

## 2020-01-11 DIAGNOSIS — Z885 Allergy status to narcotic agent status: Secondary | ICD-10-CM | POA: Diagnosis not present

## 2020-01-11 DIAGNOSIS — M7061 Trochanteric bursitis, right hip: Secondary | ICD-10-CM | POA: Diagnosis not present

## 2020-01-11 DIAGNOSIS — M5126 Other intervertebral disc displacement, lumbar region: Secondary | ICD-10-CM

## 2020-01-11 DIAGNOSIS — Z20822 Contact with and (suspected) exposure to covid-19: Secondary | ICD-10-CM | POA: Insufficient documentation

## 2020-01-11 DIAGNOSIS — F419 Anxiety disorder, unspecified: Secondary | ICD-10-CM | POA: Insufficient documentation

## 2020-01-11 DIAGNOSIS — K219 Gastro-esophageal reflux disease without esophagitis: Secondary | ICD-10-CM | POA: Insufficient documentation

## 2020-01-11 DIAGNOSIS — M79661 Pain in right lower leg: Secondary | ICD-10-CM | POA: Diagnosis not present

## 2020-01-11 DIAGNOSIS — I10 Essential (primary) hypertension: Secondary | ICD-10-CM | POA: Insufficient documentation

## 2020-01-11 DIAGNOSIS — J449 Chronic obstructive pulmonary disease, unspecified: Secondary | ICD-10-CM | POA: Insufficient documentation

## 2020-01-11 DIAGNOSIS — M545 Low back pain: Secondary | ICD-10-CM | POA: Diagnosis not present

## 2020-01-11 DIAGNOSIS — N529 Male erectile dysfunction, unspecified: Secondary | ICD-10-CM | POA: Insufficient documentation

## 2020-01-11 DIAGNOSIS — E876 Hypokalemia: Secondary | ICD-10-CM | POA: Diagnosis not present

## 2020-01-11 DIAGNOSIS — M5416 Radiculopathy, lumbar region: Secondary | ICD-10-CM | POA: Diagnosis present

## 2020-01-11 DIAGNOSIS — Z419 Encounter for procedure for purposes other than remedying health state, unspecified: Secondary | ICD-10-CM

## 2020-01-11 HISTORY — PX: DECOMPRESSIVE LUMBAR LAMINECTOMY LEVEL 1: SHX5791

## 2020-01-11 LAB — CBC WITH DIFFERENTIAL/PLATELET
Abs Immature Granulocytes: 0.04 10*3/uL (ref 0.00–0.07)
Basophils Absolute: 0 10*3/uL (ref 0.0–0.1)
Basophils Relative: 0 %
Eosinophils Absolute: 0.1 10*3/uL (ref 0.0–0.5)
Eosinophils Relative: 1 %
HCT: 38.9 % — ABNORMAL LOW (ref 39.0–52.0)
Hemoglobin: 12.3 g/dL — ABNORMAL LOW (ref 13.0–17.0)
Immature Granulocytes: 1 %
Lymphocytes Relative: 23 %
Lymphs Abs: 1.7 10*3/uL (ref 0.7–4.0)
MCH: 27.8 pg (ref 26.0–34.0)
MCHC: 31.6 g/dL (ref 30.0–36.0)
MCV: 87.8 fL (ref 80.0–100.0)
Monocytes Absolute: 0.2 10*3/uL (ref 0.1–1.0)
Monocytes Relative: 3 %
Neutro Abs: 5.5 10*3/uL (ref 1.7–7.7)
Neutrophils Relative %: 72 %
Platelets: 285 10*3/uL (ref 150–400)
RBC: 4.43 MIL/uL (ref 4.22–5.81)
RDW: 14.3 % (ref 11.5–15.5)
WBC: 7.6 10*3/uL (ref 4.0–10.5)
nRBC: 0 % (ref 0.0–0.2)

## 2020-01-11 LAB — BASIC METABOLIC PANEL
Anion gap: 8 (ref 5–15)
BUN: 13 mg/dL (ref 6–20)
CO2: 21 mmol/L — ABNORMAL LOW (ref 22–32)
Calcium: 8.9 mg/dL (ref 8.9–10.3)
Chloride: 110 mmol/L (ref 98–111)
Creatinine, Ser: 0.94 mg/dL (ref 0.61–1.24)
GFR calc Af Amer: >60 mL/min (ref >60)
GFR calc non Af Amer: >60 mL/min (ref >60)
Glucose, Bld: 98 mg/dL (ref 70–99)
Potassium: 4.5 mmol/L (ref 3.5–5.1)
Sodium: 139 mmol/L (ref 135–145)

## 2020-01-11 LAB — RESPIRATORY PANEL BY RT PCR (FLU A&B, COVID)
Influenza A by PCR: NEGATIVE
Influenza B by PCR: NEGATIVE
SARS Coronavirus 2 by RT PCR: NEGATIVE

## 2020-01-11 SURGERY — DECOMPRESSIVE LUMBAR LAMINECTOMY LEVEL 1
Anesthesia: General | Site: Spine Lumbar | Laterality: Right

## 2020-01-11 MED ORDER — LACTATED RINGERS IV SOLN: INTRAVENOUS | Status: DC

## 2020-01-11 MED ORDER — SODIUM CHLORIDE 0.9% FLUSH: 3.0000 mL | INTRAVENOUS | Status: DC | PRN

## 2020-01-11 MED ORDER — AMLODIPINE BESYLATE 5 MG PO TABS
10.0000 mg | ORAL_TABLET | Freq: Every day | ORAL | Status: DC
  Administered 2020-01-12: 10 mg via ORAL
  Filled 2020-01-11 (×2): qty 2

## 2020-01-11 MED ORDER — HYDROMORPHONE HCL 1 MG/ML IJ SOLN: 1.0000 mg | INTRAMUSCULAR | Status: DC | PRN

## 2020-01-11 MED ORDER — ACETAMINOPHEN 325 MG PO TABS
650.0000 mg | ORAL_TABLET | ORAL | Status: DC | PRN
  Administered 2020-01-11 – 2020-01-12 (×5): 650 mg via ORAL
  Filled 2020-01-11 (×4): qty 2

## 2020-01-11 MED ORDER — DEXAMETHASONE SODIUM PHOSPHATE 10 MG/ML IJ SOLN
INTRAMUSCULAR | Status: AC
  Filled 2020-01-11: qty 1

## 2020-01-11 MED ORDER — FENTANYL CITRATE (PF) 100 MCG/2ML IJ SOLN
INTRAMUSCULAR | Status: AC
  Administered 2020-01-11: 50 ug via INTRAVENOUS
  Filled 2020-01-11: qty 2

## 2020-01-11 MED ORDER — DEXAMETHASONE SODIUM PHOSPHATE 10 MG/ML IJ SOLN
10.0000 mg | Freq: Once | INTRAMUSCULAR | Status: AC
  Administered 2020-01-11: 10 mg via INTRAVENOUS
  Filled 2020-01-11: qty 1

## 2020-01-11 MED ORDER — ACETAMINOPHEN 10 MG/ML IV SOLN: 1000.0000 mg | Freq: Once | INTRAVENOUS | Status: DC | PRN

## 2020-01-11 MED ORDER — CYCLOBENZAPRINE HCL 10 MG PO TABS
10.0000 mg | ORAL_TABLET | Freq: Three times a day (TID) | ORAL | Status: DC | PRN
  Administered 2020-01-12 – 2020-01-13 (×4): 10 mg via ORAL
  Filled 2020-01-11 (×4): qty 1

## 2020-01-11 MED ORDER — PHENYLEPHRINE HCL-NACL 10-0.9 MG/250ML-% IV SOLN
INTRAVENOUS | Status: DC | PRN
  Administered 2020-01-11: 20 ug/min via INTRAVENOUS

## 2020-01-11 MED ORDER — ONDANSETRON HCL 4 MG PO TABS: 4.0000 mg | ORAL_TABLET | Freq: Four times a day (QID) | ORAL | Status: DC | PRN

## 2020-01-11 MED ORDER — HYDROCHLOROTHIAZIDE 10 MG/ML ORAL SUSPENSION
3.1250 mg | Freq: Every day | ORAL | Status: DC
  Administered 2020-01-12: 3.125 mg via ORAL
  Filled 2020-01-11 (×2): qty 0.63

## 2020-01-11 MED ORDER — ACETAMINOPHEN 650 MG RE SUPP: 650.0000 mg | RECTAL | Status: DC | PRN

## 2020-01-11 MED ORDER — CEFAZOLIN SODIUM 1 G IJ SOLR
INTRAMUSCULAR | Status: AC
  Filled 2020-01-11: qty 20

## 2020-01-11 MED ORDER — LIDOCAINE HCL (CARDIAC) PF 100 MG/5ML IV SOSY
PREFILLED_SYRINGE | INTRAVENOUS | Status: DC | PRN
  Administered 2020-01-11: 100 mg via INTRAVENOUS

## 2020-01-11 MED ORDER — LIDOCAINE-EPINEPHRINE 1 %-1:100000 IJ SOLN
INTRAMUSCULAR | Status: DC | PRN
  Administered 2020-01-11: 10 mL

## 2020-01-11 MED ORDER — ONDANSETRON HCL 4 MG/2ML IJ SOLN: 4.0000 mg | Freq: Four times a day (QID) | INTRAMUSCULAR | Status: DC | PRN

## 2020-01-11 MED ORDER — VANCOMYCIN HCL IN DEXTROSE 1-5 GM/200ML-% IV SOLN
INTRAVENOUS | Status: AC
  Filled 2020-01-11: qty 200

## 2020-01-11 MED ORDER — FENTANYL CITRATE (PF) 100 MCG/2ML IJ SOLN
50.0000 ug | Freq: Once | INTRAMUSCULAR | Status: AC
  Administered 2020-01-11: 50 ug via INTRAVENOUS
  Filled 2020-01-11: qty 2

## 2020-01-11 MED ORDER — ONDANSETRON HCL 4 MG/2ML IJ SOLN
INTRAMUSCULAR | Status: DC | PRN
  Administered 2020-01-11: 4 mg via INTRAVENOUS

## 2020-01-11 MED ORDER — KETOROLAC TROMETHAMINE 30 MG/ML IJ SOLN: 30.0000 mg | Freq: Once | INTRAMUSCULAR | Status: DC

## 2020-01-11 MED ORDER — FENTANYL CITRATE (PF) 100 MCG/2ML IJ SOLN
INTRAMUSCULAR | Status: AC
  Filled 2020-01-11: qty 2

## 2020-01-11 MED ORDER — METHYLPREDNISOLONE ACETATE 80 MG/ML IJ SUSP
INTRAMUSCULAR | Status: DC | PRN
  Administered 2020-01-11: 80 mg

## 2020-01-11 MED ORDER — DICLOFENAC EPOLAMINE 1.3 % EX PTCH
1.0000 | MEDICATED_PATCH | Freq: Two times a day (BID) | CUTANEOUS | Status: DC
  Administered 2020-01-11 – 2020-01-13 (×4): 1 via TRANSDERMAL
  Filled 2020-01-11 (×5): qty 1

## 2020-01-11 MED ORDER — DOCUSATE SODIUM 100 MG PO CAPS
100.0000 mg | ORAL_CAPSULE | Freq: Two times a day (BID) | ORAL | Status: DC
  Administered 2020-01-11 – 2020-01-12 (×2): 100 mg via ORAL
  Filled 2020-01-11 (×4): qty 1

## 2020-01-11 MED ORDER — OXYCODONE HCL 5 MG PO TABS
10.0000 mg | ORAL_TABLET | ORAL | Status: DC | PRN
  Administered 2020-01-11 – 2020-01-13 (×7): 10 mg via ORAL
  Filled 2020-01-11 (×8): qty 2

## 2020-01-11 MED ORDER — PHENYLEPHRINE 40 MCG/ML (10ML) SYRINGE FOR IV PUSH (FOR BLOOD PRESSURE SUPPORT)
PREFILLED_SYRINGE | INTRAVENOUS | Status: AC
  Filled 2020-01-11: qty 30

## 2020-01-11 MED ORDER — FENTANYL CITRATE (PF) 100 MCG/2ML IJ SOLN
50.0000 ug | INTRAMUSCULAR | Status: AC | PRN
  Administered 2020-01-11: 50 ug via INTRAVENOUS

## 2020-01-11 MED ORDER — VANCOMYCIN HCL 1000 MG IV SOLR
INTRAVENOUS | Status: DC | PRN
  Administered 2020-01-11: 18:00:00 1000 mg via INTRAVENOUS

## 2020-01-11 MED ORDER — DEXAMETHASONE SODIUM PHOSPHATE 10 MG/ML IJ SOLN
INTRAMUSCULAR | Status: DC | PRN
  Administered 2020-01-11: 4 mg via INTRAVENOUS

## 2020-01-11 MED ORDER — VORTIOXETINE HBR 20 MG PO TABS
20.0000 mg | ORAL_TABLET | Freq: Every day | ORAL | Status: DC
  Administered 2020-01-12 – 2020-01-13 (×2): 20 mg via ORAL
  Filled 2020-01-11 (×2): qty 1

## 2020-01-11 MED ORDER — LIDOCAINE-EPINEPHRINE 1 %-1:100000 IJ SOLN
INTRAMUSCULAR | Status: AC
  Filled 2020-01-11: qty 1

## 2020-01-11 MED ORDER — PHENOL 1.4 % MT LIQD: 1.0000 | OROMUCOSAL | Status: DC | PRN

## 2020-01-11 MED ORDER — MIDAZOLAM HCL 2 MG/2ML IJ SOLN
INTRAMUSCULAR | Status: AC
  Filled 2020-01-11: qty 2

## 2020-01-11 MED ORDER — PROMETHAZINE HCL 25 MG/ML IJ SOLN
6.2500 mg | INTRAMUSCULAR | Status: DC | PRN
  Administered 2020-01-11: 6.25 mg via INTRAVENOUS

## 2020-01-11 MED ORDER — SUCCINYLCHOLINE CHLORIDE 200 MG/10ML IV SOSY
PREFILLED_SYRINGE | INTRAVENOUS | Status: AC
  Filled 2020-01-11: qty 20

## 2020-01-11 MED ORDER — POLYETHYLENE GLYCOL 3350 17 G PO PACK: 17.0000 g | PACK | Freq: Every day | ORAL | Status: DC | PRN

## 2020-01-11 MED ORDER — OXYCODONE HCL 5 MG PO TABS
15.0000 mg | ORAL_TABLET | ORAL | Status: DC | PRN
  Administered 2020-01-12: 15 mg via ORAL
  Filled 2020-01-11: qty 3

## 2020-01-11 MED ORDER — ARTIFICIAL TEARS OPHTHALMIC OINT
TOPICAL_OINTMENT | OPHTHALMIC | Status: AC
  Filled 2020-01-11: qty 14

## 2020-01-11 MED ORDER — METHYLPREDNISOLONE ACETATE 80 MG/ML IJ SUSP
INTRAMUSCULAR | Status: AC
  Filled 2020-01-11: qty 1

## 2020-01-11 MED ORDER — ONDANSETRON HCL 4 MG/2ML IJ SOLN
INTRAMUSCULAR | Status: AC
  Filled 2020-01-11: qty 8

## 2020-01-11 MED ORDER — SODIUM CHLORIDE 0.9 % IV SOLN: INTRAVENOUS | Status: DC | PRN

## 2020-01-11 MED ORDER — SODIUM CHLORIDE 0.9 % IV SOLN: 250.0000 mL | INTRAVENOUS | Status: DC

## 2020-01-11 MED ORDER — EPHEDRINE 5 MG/ML INJ
INTRAVENOUS | Status: AC
  Filled 2020-01-11: qty 30

## 2020-01-11 MED ORDER — LIDOCAINE 2% (20 MG/ML) 5 ML SYRINGE
INTRAMUSCULAR | Status: AC
  Filled 2020-01-11: qty 5

## 2020-01-11 MED ORDER — PROMETHAZINE HCL 25 MG/ML IJ SOLN
INTRAMUSCULAR | Status: AC
  Filled 2020-01-11: qty 1

## 2020-01-11 MED ORDER — ROCURONIUM BROMIDE 100 MG/10ML IV SOLN
INTRAVENOUS | Status: DC | PRN
  Administered 2020-01-11: 50 mg via INTRAVENOUS
  Administered 2020-01-11: 20 mg via INTRAVENOUS
  Administered 2020-01-11: 30 mg via INTRAVENOUS

## 2020-01-11 MED ORDER — MIDAZOLAM HCL 5 MG/5ML IJ SOLN
INTRAMUSCULAR | Status: DC | PRN
  Administered 2020-01-11: 2 mg via INTRAVENOUS

## 2020-01-11 MED ORDER — PROPOFOL 10 MG/ML IV BOLUS
INTRAVENOUS | Status: AC
  Filled 2020-01-11: qty 20

## 2020-01-11 MED ORDER — FENTANYL CITRATE (PF) 250 MCG/5ML IJ SOLN
INTRAMUSCULAR | Status: AC
  Filled 2020-01-11: qty 5

## 2020-01-11 MED ORDER — PROPOFOL 10 MG/ML IV BOLUS
INTRAVENOUS | Status: DC | PRN
  Administered 2020-01-11: 140 mg via INTRAVENOUS
  Administered 2020-01-11: 20 mg via INTRAVENOUS

## 2020-01-11 MED ORDER — ROCURONIUM BROMIDE 10 MG/ML (PF) SYRINGE
PREFILLED_SYRINGE | INTRAVENOUS | Status: AC
  Filled 2020-01-11: qty 20

## 2020-01-11 MED ORDER — 0.9 % SODIUM CHLORIDE (POUR BTL) OPTIME
TOPICAL | Status: DC | PRN
  Administered 2020-01-11: 1000 mL

## 2020-01-11 MED ORDER — DIPHENHYDRAMINE HCL 50 MG/ML IJ SOLN
INTRAMUSCULAR | Status: DC | PRN
  Administered 2020-01-11: 25 mg via INTRAVENOUS

## 2020-01-11 MED ORDER — FAMOTIDINE 20 MG PO TABS
40.0000 mg | ORAL_TABLET | Freq: Every day | ORAL | Status: DC
  Administered 2020-01-12 – 2020-01-13 (×2): 40 mg via ORAL
  Filled 2020-01-11 (×2): qty 2

## 2020-01-11 MED ORDER — HYDROMORPHONE HCL 1 MG/ML IJ SOLN
0.2500 mg | INTRAMUSCULAR | Status: DC | PRN
  Administered 2020-01-11: 0.5 mg via INTRAVENOUS

## 2020-01-11 MED ORDER — KETOROLAC TROMETHAMINE 30 MG/ML IJ SOLN
15.0000 mg | Freq: Once | INTRAMUSCULAR | Status: AC
  Administered 2020-01-11: 15 mg via INTRAVENOUS
  Filled 2020-01-11: qty 1

## 2020-01-11 MED ORDER — KETAMINE HCL 50 MG/5ML IJ SOSY
PREFILLED_SYRINGE | INTRAMUSCULAR | Status: AC
  Filled 2020-01-11: qty 5

## 2020-01-11 MED ORDER — LACTATED RINGERS IV SOLN: INTRAVENOUS | Status: DC | PRN

## 2020-01-11 MED ORDER — IRBESARTAN 300 MG PO TABS
300.0000 mg | ORAL_TABLET | Freq: Every day | ORAL | Status: DC
  Administered 2020-01-12: 300 mg via ORAL
  Filled 2020-01-11 (×2): qty 1

## 2020-01-11 MED ORDER — DIPHENHYDRAMINE HCL 50 MG/ML IJ SOLN
INTRAMUSCULAR | Status: AC
  Filled 2020-01-11: qty 1

## 2020-01-11 MED ORDER — TRAZODONE HCL 150 MG PO TABS
75.0000 mg | ORAL_TABLET | Freq: Every day | ORAL | Status: DC
  Administered 2020-01-11 – 2020-01-12 (×2): 75 mg via ORAL
  Filled 2020-01-11 (×2): qty 1

## 2020-01-11 MED ORDER — HYDROMORPHONE HCL 1 MG/ML IJ SOLN
INTRAMUSCULAR | Status: AC
  Filled 2020-01-11: qty 1

## 2020-01-11 MED ORDER — SODIUM CHLORIDE 0.9% FLUSH: 3.0000 mL | Freq: Two times a day (BID) | INTRAVENOUS | Status: DC

## 2020-01-11 MED ORDER — THROMBIN 5000 UNITS EX SOLR: OROMUCOSAL | Status: DC | PRN

## 2020-01-11 MED ORDER — THROMBIN 5000 UNITS EX SOLR
CUTANEOUS | Status: AC
  Filled 2020-01-11: qty 5000

## 2020-01-11 MED ORDER — ROCURONIUM BROMIDE 10 MG/ML (PF) SYRINGE
PREFILLED_SYRINGE | INTRAVENOUS | Status: AC
  Filled 2020-01-11: qty 10

## 2020-01-11 MED ORDER — SUGAMMADEX SODIUM 200 MG/2ML IV SOLN
INTRAVENOUS | Status: DC | PRN
  Administered 2020-01-11: 200 mg via INTRAVENOUS

## 2020-01-11 MED ORDER — MENTHOL 3 MG MT LOZG: 1.0000 | LOZENGE | OROMUCOSAL | Status: DC | PRN

## 2020-01-11 MED ORDER — ACETAMINOPHEN 10 MG/ML IV SOLN
INTRAVENOUS | Status: AC
  Administered 2020-01-11: 1000 mg via INTRAVENOUS
  Filled 2020-01-11: qty 100

## 2020-01-11 MED ORDER — FENTANYL CITRATE (PF) 100 MCG/2ML IJ SOLN
INTRAMUSCULAR | Status: DC | PRN
  Administered 2020-01-11 (×3): 50 ug via INTRAVENOUS
  Administered 2020-01-11: 100 ug via INTRAVENOUS

## 2020-01-11 MED ORDER — LORAZEPAM 0.5 MG PO TABS: 1.0000 mg | ORAL_TABLET | Freq: Two times a day (BID) | ORAL | Status: DC | PRN

## 2020-01-11 MED ORDER — LORAZEPAM 2 MG/ML IJ SOLN
1.0000 mg | INTRAMUSCULAR | Status: DC | PRN
  Administered 2020-01-11: 1 mg via INTRAVENOUS
  Filled 2020-01-11: qty 1

## 2020-01-11 SURGICAL SUPPLY — 53 items
BAG DECANTER FOR FLEXI CONT (MISCELLANEOUS) ×2 IMPLANT
BLADE CLIPPER SURG (BLADE) IMPLANT
BLADE SURG 11 STRL SS (BLADE) ×2 IMPLANT
BUR MATCHSTICK NEURO 3.0 LAGG (BURR) ×1 IMPLANT
BUR PRECISION FLUTE 5.0 (BURR) ×1 IMPLANT
CANISTER SUCT 3000ML PPV (MISCELLANEOUS) ×2 IMPLANT
COVER WAND RF STERILE (DRAPES) ×2 IMPLANT
DECANTER SPIKE VIAL GLASS SM (MISCELLANEOUS) ×2 IMPLANT
DERMABOND ADVANCED (GAUZE/BANDAGES/DRESSINGS) ×1
DERMABOND ADVANCED .7 DNX12 (GAUZE/BANDAGES/DRESSINGS) ×1 IMPLANT
DRAPE C-ARM 42X72 X-RAY (DRAPES) ×4 IMPLANT
DRAPE LAPAROTOMY 100X72X124 (DRAPES) ×2 IMPLANT
DRAPE MICROSCOPE LEICA (MISCELLANEOUS) ×2 IMPLANT
DRAPE SURG 17X23 STRL (DRAPES) ×2 IMPLANT
DURAPREP 26ML APPLICATOR (WOUND CARE) ×2 IMPLANT
ELECT REM PT RETURN 9FT ADLT (ELECTROSURGICAL) ×2
ELECTRODE REM PT RTRN 9FT ADLT (ELECTROSURGICAL) ×1 IMPLANT
GAUZE 4X4 16PLY RFD (DISPOSABLE) IMPLANT
GAUZE SPONGE 4X4 12PLY STRL (GAUZE/BANDAGES/DRESSINGS) IMPLANT
GLOVE BIO SURGEON STRL SZ 6.5 (GLOVE) ×1 IMPLANT
GLOVE BIO SURGEON STRL SZ7 (GLOVE) ×1 IMPLANT
GLOVE BIO SURGEON STRL SZ7.5 (GLOVE) ×2 IMPLANT
GLOVE BIOGEL PI IND STRL 7.5 (GLOVE) ×1 IMPLANT
GLOVE BIOGEL PI INDICATOR 7.5 (GLOVE) ×1
GLOVE EXAM NITRILE LRG STRL (GLOVE) IMPLANT
GLOVE EXAM NITRILE XL STR (GLOVE) IMPLANT
GLOVE EXAM NITRILE XS STR PU (GLOVE) IMPLANT
GOWN STRL REUS W/ TWL LRG LVL3 (GOWN DISPOSABLE) ×2 IMPLANT
GOWN STRL REUS W/ TWL XL LVL3 (GOWN DISPOSABLE) IMPLANT
GOWN STRL REUS W/TWL 2XL LVL3 (GOWN DISPOSABLE) IMPLANT
GOWN STRL REUS W/TWL LRG LVL3 (GOWN DISPOSABLE) ×4
GOWN STRL REUS W/TWL XL LVL3 (GOWN DISPOSABLE) ×2
HEMOSTAT POWDER KIT SURGIFOAM (HEMOSTASIS) ×2 IMPLANT
KIT BASIN OR (CUSTOM PROCEDURE TRAY) ×2 IMPLANT
KIT TURNOVER KIT B (KITS) ×2 IMPLANT
NDL HYPO 18GX1.5 BLUNT FILL (NEEDLE) IMPLANT
NDL SPNL 18GX3.5 QUINCKE PK (NEEDLE) ×1 IMPLANT
NEEDLE HYPO 18GX1.5 BLUNT FILL (NEEDLE) IMPLANT
NEEDLE HYPO 22GX1.5 SAFETY (NEEDLE) ×2 IMPLANT
NEEDLE SPNL 18GX3.5 QUINCKE PK (NEEDLE) ×2 IMPLANT
NS IRRIG 1000ML POUR BTL (IV SOLUTION) ×2 IMPLANT
PACK LAMINECTOMY NEURO (CUSTOM PROCEDURE TRAY) ×2 IMPLANT
PAD ARMBOARD 7.5X6 YLW CONV (MISCELLANEOUS) ×6 IMPLANT
RUBBERBAND STERILE (MISCELLANEOUS) ×4 IMPLANT
SPONGE LAP 4X18 RFD (DISPOSABLE) IMPLANT
SUT MNCRL AB 3-0 PS2 18 (SUTURE) ×2 IMPLANT
SUT VIC AB 0 CT1 18XCR BRD8 (SUTURE) ×1 IMPLANT
SUT VIC AB 0 CT1 8-18 (SUTURE) ×2
SUT VIC AB 2-0 CT2 18 VCP726D (SUTURE) ×2 IMPLANT
SYR 3ML LL SCALE MARK (SYRINGE) IMPLANT
TOWEL GREEN STERILE (TOWEL DISPOSABLE) ×2 IMPLANT
TOWEL GREEN STERILE FF (TOWEL DISPOSABLE) ×2 IMPLANT
WATER STERILE IRR 1000ML POUR (IV SOLUTION) ×2 IMPLANT

## 2020-01-11 NOTE — Anesthesia Procedure Notes (Signed)
Procedure Name: Intubation Date/Time: 01/11/2020 5:43 PM Performed by: Amadeo Garnet, CRNA Pre-anesthesia Checklist: Patient identified, Emergency Drugs available, Suction available and Patient being monitored Patient Re-evaluated:Patient Re-evaluated prior to induction Oxygen Delivery Method: Circle system utilized Preoxygenation: Pre-oxygenation with 100% oxygen Induction Type: IV induction Ventilation: Mask ventilation without difficulty Laryngoscope Size: Mac and 4 Grade View: Grade I Tube type: Oral Tube size: 7.5 mm Number of attempts: 1 Airway Equipment and Method: Stylet Placement Confirmation: ETT inserted through vocal cords under direct vision,  positive ETCO2 and breath sounds checked- equal and bilateral Secured at: 22 cm Tube secured with: Tape Dental Injury: Teeth and Oropharynx as per pre-operative assessment

## 2020-01-11 NOTE — ED Triage Notes (Signed)
Pt states he bent over to clean commode Monday, later his back "completely went out" felt a pop and has had pain ever since.   Rt leg pain from "butt cheek, all the way down"

## 2020-01-11 NOTE — H&P (Signed)
Neurosurgery H&P  CC: RLE pain & weakness  HPI: This is a 59 y.o. man that presents with back, RLE pain, and RLE weakness. The patient had some mild pain 4d ago but this became much more severe today while bending over. He has been non-ambulatory since that time due to RLE pain and weakness. The pain is sharp in quality, severe in intensity, worse with movement, improved with rest. No recent change in bowel or bladder function. No recent use of anti-platelet or anti-coagulant medications.   ROS: A 14 point ROS was performed and is negative except as noted in the HPI.   PMHx:  Past Medical History:  Diagnosis Date  . Anemia   . Anxiety   . Arthritis    neck, shoulder, left knee  . 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 Erythrocytosis 11/30/2014  . Essential hypertension   . GERD (gastroesophageal reflux disease)   . Headache(784.0)    2 x/week  . History of kidney stones   . Hypothyroidism    hx of   . Leukocytosis, unspecified 11/03/2013  . Limited joint range of motion    cervical fusion 02/2013  . Medial meniscus tear 06/2013   left knee  . 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 Throat and mouth symptom    itchy throat x 3 weeks  . Thyroid nodule   . Tobacco abuse   . Wears partial dentures    upper   FamHx:  Family History  Problem Relation Age of Onset  . Colon cancer Father 51  . Breast cancer Sister 52  . Thyroid disease Neg Hx    SocHx:  reports that he quit smoking about 4 years ago. His smoking use included cigarettes. He has a 25.00 pack-year smoking history. He has never used smokeless tobacco. He reports current alcohol use. He reports that he does not use drugs.  Exam: Vital signs in last 24 hours: Temp:  [98 F (36.7 C)] 98 F (36.7 C) (03/17 0910) Pulse Rate:  [73-88] 88 (03/17 1327) Resp:  [18-19] 18 (03/17 1327) BP: (116-146)/(78-89) 136/89 (03/17 1327) SpO2:  [96 %-99 %] 97 % (03/17 1327) Weight:  [90.7 kg] 90.7  kg (03/17 0908) General: Awake, alert, cooperative, unable to find a comfortable position, looks like he is in pain Head: normocephalic and atruamatic HEENT: neck supple Pulmonary: breathing room air comfortably, no evidence of increased work of breathing Cardiac: RRR Abdomen: S NT ND, numerous abdominal scars Extremities: warm and well perfused x4 Neuro: Strength 5/5 in BUE & LLE, RLE pain limited proximally 4/5, distally 4-/5 in PF, 4-/5 EHL, 4-/5 DF   Assessment and Plan: 59 y.o. man w/ acute RLE pain and RLE weakness. MRI L-spine w/o contrast personally reviewed, which shows right L4-5 foraminal and far lateral disc herniation contacting and compressing the right L4 nerve root.   -discussed the above with the patient. Given his acute onset weakness, I recommend decompression of her nerve root to prevent long-term weakness. This could be accomplished with a right L4-5 MIS far lateral discectomy. We discussed risks, benefits, and alternatives and he would like to proceed. -4NP post-op  Judith Part, MD 01/11/20 1:57 PM [facility]

## 2020-01-11 NOTE — Op Note (Signed)
PATIENT: [name] Bee Sr.  DAY OF SURGERY: 01/11/20   PRE-OPERATIVE DIAGNOSIS:  Herniated nucleus pulposus with lumbar radiculopathy   POST-OPERATIVE DIAGNOSIS:  Herniated nucleus pulposus with lumbar radiculopathy   PROCEDURE:  Minimally invasive right L4-5 far lateral discectomy   SURGEON:  Surgeon(s) and Role:    Judith Part, MD - Primary   ANESTHESIA: ETGA   BRIEF HISTORY: This is a 59 year old man who presented with acute onset right lower extremity and back pain with right leg weakness. An MRI showed a large right L4-5 far lateral / extraforaminal disc herniation that correlated with his symptoms. I therefore recommended a minimally invasive right L4-5 far lateral discectomy. This was discussed with the patient as well as risks, benefits, and alternatives and the patient wished to proceed with surgical treatment.    OPERATIVE DETAIL: The patient was taken to the operating room, anesthesia was induced by the anesthesia team and the patient was placed on the OR table in the prone position. A formal time out was performed with two patient identifiers and confirmed the operative site. The operative site was already marked, hair was clipped with surgical clippers, the area was then prepped and draped in a sterile fashion. Fluoroscopy was used to identify the surgical level.   Using fluoroscopy, an incision was created on the right between the L4 and L5 pedicles longitudinally. The fascia was incised sharply and serial dilators were docked to the intersection of the pars and facet of L4. A final tubular distractor was placed and secured to the table and fluoro again confirmed the correct surgical level. The transverse process, pars interarticularis, and facet were identified for orientation. The pedicle and then foramen were identified to determine the course of the nerve root. A high speed drill was then used to remove a small portion of the pars and lateral L4-5 facet to expose the  root and disc space extraforaminally. The inter-transverse ligament was identified and divided. There was a large disc herniation present in the far lateral segment, the foraminal component was less severe. This was removed until the nerve was palpated without any further distal compression. An annulotomy was created medially and downgoing curettes were used to produce some adherent lateral pieces. The annulotomy was explored and any additional free fragments were removed. The nerve root was palpated throughout the foramen to confirm good decompression.   Hemostasis was obtained, the wound was copiously irrigated, and the tube was removed while using the microscope to confirm hemostasis of the muscle edges. All instrument and sponge counts were correct and the incision was then closed in layers. The patient was then returned to anesthesia for emergence. No apparent complications at the completion of the procedure.    EBL:  26mL   DRAINS: none   SPECIMENS: none   Judith Part, MD 01/11/20 3:17 PM

## 2020-01-11 NOTE — Anesthesia Preprocedure Evaluation (Addendum)
Anesthesia Evaluation  Patient identified by MRN, date of birth, ID band Patient awake    Reviewed: Allergy & Precautions, NPO status , Patient's Chart, lab work & pertinent test results  Airway Mallampati: II  TM Distance: >3 FB Neck ROM: Full    Dental  (+) Missing,    Pulmonary pneumonia, COPD, former smoker,    Pulmonary exam normal breath sounds clear to auscultation       Cardiovascular hypertension, Pt. on medications Normal cardiovascular exam Rhythm:Regular Rate:Normal  ECG: SR, rate 95   Neuro/Psych  Headaches, PSYCHIATRIC DISORDERS Anxiety    GI/Hepatic Neg liver ROS, GERD  Medicated and Controlled,  Endo/Other  Hypothyroidism Hyperthyroidism   Renal/GU negative Renal ROS     Musculoskeletal  (+) Arthritis ,   Abdominal   Peds  Hematology  (+) anemia ,   Anesthesia Other Findings HERNIATED NUCLEUS PROPULSUS  Reproductive/Obstetrics                           Anesthesia Physical Anesthesia Plan  ASA: II and emergent  Anesthesia Plan: General   Post-op Pain Management:    Induction: Intravenous  PONV Risk Score and Plan: 3 and Midazolam, Dexamethasone, Ondansetron and Treatment may vary due to age or medical condition  Airway Management Planned: Oral ETT  Additional Equipment:   Intra-op Plan:   Post-operative Plan: Extubation in OR  Informed Consent: I have reviewed the patients History and Physical, chart, labs and discussed the procedure including the risks, benefits and alternatives for the proposed anesthesia with the patient or authorized representative who has indicated his/her understanding and acceptance.     Dental advisory given  Plan Discussed with: CRNA and Anesthesiologist  Anesthesia Plan Comments:       Anesthesia Quick Evaluation

## 2020-01-11 NOTE — Transfer of Care (Signed)
Immediate Anesthesia Transfer of Care Note  Patient: [name] Debevec Sr.  Procedure(s) Performed: RIGHT LUMBAR FOUR-FIVE MINIMALLY INVASIVE LUMBAR DISCECTOMY (Right Spine Lumbar)  Patient Location: PACU  Anesthesia Type:General  Level of Consciousness: drowsy  Airway & Oxygen Therapy: Patient Spontanous Breathing and Patient connected to face mask oxygen  Post-op Assessment: Report given to RN and Post -op Vital signs reviewed and stable  Post vital signs: Reviewed and stable  Last Vitals:  Vitals Value Taken Time  BP 110/72 01/11/20 2024  Temp    Pulse 92 01/11/20 2026  Resp 14 01/11/20 2026  SpO2 96 % 01/11/20 2026  Vitals shown include unvalidated device data.  Last Pain:  Vitals:   01/11/20 1645  TempSrc:   PainSc: 8       Patients Stated Pain Goal: 3 ([id number])  Complications: No apparent anesthesia complications

## 2020-01-11 NOTE — ED Provider Notes (Addendum)
[facility] DEPT Provider Note   CSN: [id number] Arrival date & time: 01/11/20  0900     History Chief Complaint  Patient presents with  . Back Pain    [name] [name]. is a 59 y.o. male.  HPI     Patient presents with acute onset low back pain with numbness and weakness in his right lower extremity. He has a history of a cervical spine surgery, but no lumbar spine surgery.  He notes that he was in his usual state of health until 4 days ago when he started feeling some discomfort in his lower back.  Today after that while leaning over the patient felt sudden onset severe pain with a tearing sensation in his lower back.  Since that time he has had inability to bear weight on the right leg for more than a brief moment, worsening pain, described as sharp, burning throughout the posterior of the leg originating in the right lower back.  Pain is worse with any attempted motion, ambulation.  Pain is not improved with Percocet.  Past Medical History:  Diagnosis Date  . Anemia   . Anxiety   . Arthritis    neck, shoulder, left knee  . 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 Erythrocytosis 11/30/2014  . Essential hypertension   . GERD (gastroesophageal reflux disease)   . Headache(784.0)    2 x/week  . History of kidney stones   . Hypothyroidism    hx of   . Leukocytosis, unspecified 11/03/2013  . Limited joint range of motion    cervical fusion 02/2013  . Medial meniscus tear 06/2013   left knee  . 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 Throat and mouth symptom    itchy throat x 3 weeks  . Thyroid nodule   . Tobacco abuse   . Wears partial dentures    upper    Patient Active Problem List   Diagnosis Date Noted  . PVC (premature ventricular contraction)   . Anxiety state 08/14/2017  . Hypomagnesemia 06/28/2017  . Hypothyroidism   . 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  Protein-calorie malnutrition, severe ([place]) 06/27/2017  . 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 Pressure injury of skin 05/29/2017  . Ileus ([place])   . Hypokalemia 05/14/2017  . Anastomotic leak of intestine 05/14/2017  . Wound dehiscence, surgical 05/14/2017  . Aspiration pneumonia ([place]) 05/14/2017  . Chronic narcotic use 05/10/2017  . Generalized anxiety disorder 05/10/2017  . Intra-abdominal adhesions s/p SB resection 05/06/2017 05/09/2017  . Throat symptom 03/18/2016  . Multinodular goiter 01/19/2016  . Hyperthyroidism 01/19/2016  . Essential hypertension   . Diarrhea 11/30/2014  . Anemia, iron deficiency 11/01/2014  . Leukocytosis 11/03/2013  . Tobacco abuse 07/26/2013  . COPD (chronic obstructive pulmonary disease) ([place]) 07/26/2013  . Left knee pain 07/26/2013  . Pain in joint, shoulder region 05/03/2013  . GERD 07/24/2008  . 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 ABDOMINAL SURGERY    . ANTERIOR CERVICAL DECOMP/DISCECTOMY FUSION N/A 03/18/2013   Procedure: ANTERIOR CERVICAL DECOMPRESSION/DISCECTOMY FUSION 3 LEVELS;  Surgeon: Erline Levine, MD;  Location: [facility];  Service: Neurosurgery;  Laterality: N/A;  Anterior Cervical Three-Six  Decompression/Diskectomy/Fusion  . APPENDECTOMY    . CIRCUMCISION  05/04/2006  . COLONOSCOPY    . CYSTOSCOPY/URETEROSCOPY/HOLMIUM LASER/STENT PLACEMENT Left 11/24/2018   Procedure: CYSTOSCOPY/URETEROSCOPY/HOLMIUM LASER/STENT  PLACEMENT;  Surgeon: Lucas Mallow, MD;  Location: [facility];  Service: Urology;  Laterality: Left;  . EYE SURGERY     bilateral cataract surgery   . HEMICOLECTOMY Right [id number]   with ileocolic anastomosis  . INGUINAL HERNIA REPAIR    . INSERTION OF MESH N/A 07/31/2017   Procedure: INSERTION OF MESH;  Surgeon: Johnathan Hausen, MD;  Location: [facility];  Service: General;  Laterality: N/A;  . KNEE  ARTHROSCOPY Left 08/01/2013   Procedure: LEFT ARTHROSCOPY KNEE;  Surgeon: Kerin Salen, MD;  Location: [facility];  Service: Orthopedics;  Laterality: Left;  . LAPAROSCOPIC ADRENALECTOMY Right 05/06/2017   Procedure: OPEN RIGHT ADRENALECTOMY WITH SMALL BOWEL RESECTION AND ANASTOMOSIS;  Surgeon: Kieth Brightly, Arta Bruce, MD;  Location: [facility];  Service: General;  Laterality: Right;  . LAPAROSCOPIC LYSIS OF ADHESIONS N/A 07/31/2017   Procedure: LYSIS OF ADHESIONS;  Surgeon: Johnathan Hausen, MD;  Location: [facility];  Service: General;  Laterality: N/A;  . LAPAROSCOPY N/A 07/31/2017   Procedure: LAPAROSCOPY DIAGNOSTIC;  Surgeon: Johnathan Hausen, MD;  Location: [facility];  Service: General;  Laterality: N/A;  . LAPAROTOMY N/A 05/13/2017   Procedure: EXPLORATORY LAPAROTOMY, with wound dehisense, and repair of anastamotic leak;  Surgeon: Mickeal Skinner, MD;  Location: [facility];  Service: General;  Laterality: N/A;  . LAPAROTOMY N/A 05/14/2017   Procedure: EXPLORATORY LAPAROTOMY, LYSIS OF ADHESIONS/ SMALL BOWEL RESECTION/ WASHOUT OF ABDOMEN AND PLACEMENT OF NEGATIVE PRESSURE DRESSING;  Surgeon: Kieth Brightly, Arta Bruce, MD;  Location: [facility];  Service: General;  Laterality: N/A;  . LAPAROTOMY N/A 05/17/2017   Procedure: EXPLORATORY LAPAROTOMY, PLACEMENT OF ILEOSTOMY TUBES, PARTIAL CLOSURE OF FASCIA, WOUND VAC EXCHANGE;  Surgeon: [name], Arta Bruce, MD;  Location: [facility];  Service: General;  Laterality: N/A;  . LAPAROTOMY N/A 05/20/2017   Procedure: ABDOMINAL WOUND [place] OUT, WOUND CLOSURE AND WOUND VAC PLACEMENT;  Surgeon: [name], Arta Bruce, MD;  Location: [facility];  Service: General;  Laterality: N/A;  . LAPAROTOMY N/A 07/31/2017   Procedure: EXPLORATORY LAPAROTOMY WITH CLOSURE OF ENTEROTOMICS;  Surgeon: Johnathan Hausen, MD;  Location: [facility];  Service: General;  Laterality: N/A;  . SHOULDER ARTHROSCOPY W/ ROTATOR CUFF REPAIR Left 07/16/2010  . SMALL INTESTINE SURGERY  04/24/2000   adhesiolysis,  ileocolic anastomosis  . UPPER GASTROINTESTINAL ENDOSCOPY         Family History  Problem Relation Age of Onset  . Colon cancer Father 39  . Breast cancer Sister 37  . Thyroid disease Neg Hx     Social History   Tobacco Use  . Smoking status: Former Smoker    Packs/day: 1.00    Years: 25.00    Pack years: 25.00    Types: Cigarettes    Quit date: 2017    Years since quitting: 4.2  . Smokeless tobacco: Never Used  . Tobacco comment: quit smoking 3 years ago  Substance Use Topics  . Alcohol use: Yes    Comment: occasionally  . Drug use: No    Home Medications Prior to Admission medications   Medication Sig Start Date End Date Taking? Authorizing Provider  amLODipine (NORVASC) 10 MG tablet Take 10 mg by mouth daily.  03/11/19  Yes [provider]  betamethasone dipropionate 0.05 % cream Apply 1 application topically 2 (two) times daily.  01/04/20  Yes [provider]  famotidine (PEPCID) 40 MG tablet Take 40 mg by mouth daily.   Yes [provider]  hydrochlorothiazide (HYDRODIURIL) 12.5 MG  tablet Take 3.125 mg by mouth daily.  09/01/19  Yes [provider]  ibuprofen (ADVIL) 200 MG tablet Take 1,200 mg by mouth every 6 (six) hours as needed for moderate pain.   Yes [provider]  LORazepam (ATIVAN) 2 MG tablet Take 1-2 mg by mouth 2 (two) times daily as needed for anxiety.  08/26/19  Yes [provider]  Magnesium 200 MG TABS Take 200 mg by mouth every other day.    Yes [provider]  Multiple Vitamin (MULTIVITAMIN WITH MINERALS) TABS Take 1 tablet by mouth daily.   Yes [provider]  oxyCODONE-acetaminophen (PERCOCET) 10-325 MG tablet Take 1 tablet by mouth 4 (four) times daily.   Yes [provider]  Probiotic Product (PROBIOTIC PO) Take 1 capsule by mouth daily.   Yes [provider]  sildenafil (VIAGRA) 100 MG tablet Take 100 mg by mouth daily as needed for erectile dysfunction.    Yes [provider]  telmisartan (MICARDIS) 80 MG tablet Take 80 mg by mouth at bedtime.   Yes [provider]  traZODone (DESYREL) 100 MG tablet Take 75 mg by mouth at bedtime.    Yes [provider]  vortioxetine HBr (TRINTELLIX) 20 MG TABS tablet Take 1 tablet (20 mg total) by mouth daily. 11/01/19  Yes Arfeen, Arlyce Harman, MD  cyclobenzaprine (FLEXERIL) 10 MG tablet Take 0.5-1 tablets (5-10 mg total) by mouth 2 (two) times daily as needed for muscle spasms. Patient not taking: Reported on 01/11/2020 06/03/19   [id number], Abigail, PA-C  diclofenac sodium (VOLTAREN) 1 % GEL Apply 4 g 4 (four) times daily topically. Patient not taking: Reported on 01/11/2020 09/04/17   Kalman Drape, PA  fluticasone (FLONASE) 50 MCG/ACT nasal spray Place 1 spray into both nostrils daily. Patient not taking: Reported on 11/12/2019 09/10/19   Jacqualine Mau, NP  gabapentin (NEURONTIN) 100 MG capsule Take 1 capsule (100 mg total) by mouth 2 (two) times daily. Patient not taking: Reported on 01/11/2020 12/12/19 12/11/20  Arfeen, Arlyce Harman, MD  oxymetazoline (AFRIN NASAL SPRAY) 0.05 % nasal spray Place 1 spray into both nostrils 2 (two) times daily. Patient not taking: Reported on 11/12/2019 09/10/19   Jacqualine Mau, NP  tamsulosin (FLOMAX) 0.4 MG CAPS capsule Take 1 capsule (0.4 mg total) by mouth daily after supper. Patient not taking: Reported on 01/11/2020 11/12/19   Delia Heady, PA-C  simethicone (MYLICON) 80 MG chewable tablet Chew 80 mg by mouth every 6 (six) hours as needed for flatulence.  02/01/14  [provider]    Allergies    Chantix [varenicline], Morphine, and Penicillins  Review of Systems   Review of Systems  Constitutional:       Per HPI, otherwise negative  HENT:       Per HPI, otherwise negative  Respiratory:       Per HPI, otherwise negative  Cardiovascular:       Per HPI, otherwise negative  Gastrointestinal: Negative for vomiting.  Endocrine:        Negative aside from HPI  Genitourinary:       Neg aside from HPI   Musculoskeletal:       Per HPI, otherwise negative  Skin: Negative.   Neurological: Negative for syncope.    Physical Exam Updated Vital Signs BP 116/78   Pulse 73   Temp 98 F (36.7 C) (Oral)   Resp 18   Ht 5\' 11"  (1.803 m)   Wt 90.7 kg  SpO2 96%   BMI 27.89 kg/m   Physical Exam Vitals and nursing note reviewed.  Constitutional:      General: He is not in acute distress.    Appearance: He is well-developed.  HENT:     Head: Normocephalic and atraumatic.  Eyes:     Conjunctiva/sclera: Conjunctivae normal.  Cardiovascular:     Rate and Rhythm: Normal rate and regular rhythm.  Pulmonary:     Effort: Pulmonary effort is normal. No respiratory distress.     Breath sounds: No stridor.  Abdominal:     General: There is no distension.  Musculoskeletal:       Arms:  Skin:    General: Skin is warm and dry.  Neurological:     Mental Status: He is alert and oriented to person, place, and time.     Comments: Patient has preserved sensation distally in the leg, but has weakness more appreciable proximally than distally in the right.     ED Results / Procedures / Treatments   Labs (all labs ordered are listed, but only abnormal results are displayed) Labs Reviewed  RESPIRATORY PANEL BY RT PCR (FLU A&B, COVID)    EKG None  Radiology MR Lumbar Spine Wo Contrast  Result Date: 01/11/2020 CLINICAL DATA:  Worsening right lower extremity weakness/pain following acute onset painful event; low back pain, cauda equina syndrome suspected; low back pain, progressive neurologic deficit. Additional history provided by technologist: Patient reports he bent over Monday and later his back "completely went out." Patient reports right leg pain from "butt cheek" all the way down. EXAM: MRI LUMBAR SPINE WITHOUT CONTRAST TECHNIQUE: Multiplanar, multisequence MR imaging of the lumbar spine was performed. No intravenous  contrast was administered. COMPARISON:  CT abdomen/pelvis 11/12/2019, lumbar spine radiographs 06/03/2019 FINDINGS: Segmentation:  5 lumbar vertebrae. Alignment: Straightening of the expected lumbar lordosis. Trace L4-L5 retrolisthesis. Vertebrae: Vertebral body height is maintained. No marrow edema or suspicious osseous lesion. Conus medullaris and cauda equina: Conus extends to the L1-L2 level. No signal abnormality within the visualized distal spinal cord. Thin T1 hyperintensity within the midline dorsal thecal sac at the L1-L2 levels which may reflect a small fibrolipoma of the filum terminale. Paraspinal and other soft tissues: No abnormality identified within included portions of the abdomen/retroperitoneum. Soft tissues within normal limits. Disc levels: Mild multilevel disc degeneration greatest at L4-L5. L1-L2: No disc herniation. No significant canal or foraminal stenosis. L2-L3: Minimal disc bulge. No significant spinal canal or foraminal stenosis. L3-L4: No disc herniation. No significant canal or foraminal stenosis. L4-L5: Disc bulge. Superimposed large right foraminal/extraforaminal disc extrusion. Mild facet arthrosis/ligamentum flavum hypertrophy. Minimal bilateral subarticular narrowing without nerve root impingement. Central canal patent. The disc extrusion contributes to severe right neural foraminal narrowing, encroaching upon the exiting right L4 nerve root. Additionally, the large extraforaminal component of extruded disc likely affects the right L4 nerve root beyond the neural foramen (series 8, images 20 6-27). Sequestered disc material cannot be excluded. Mild left foraminal stenosis. L5-S1: Tiny central disc protrusion eccentric to the right. No significant spinal canal stenosis or neural foraminal narrowing. IMPRESSION: Lumbar spondylosis as outlined and most notably as follows. At L4-L5, there is a disc bulge with superimposed large right foraminal/extraforaminal disc extrusion. Mild  facet arthrosis/ligamentum flavum hypertrophy. The disc extrusion contributes to severe right neural foraminal narrowing encroaching upon the exiting right L4 nerve root. Additionally, there is a large extraforaminal component of extruded disc which likely affects the right L4 nerve root beyond the neural foramen.  Sequestered disc material cannot be excluded. Multifactorial minimal bilateral subarticular narrowing and mild left neural foraminal narrowing also present at this level. No significant spinal canal stenosis or neural foraminal narrowing at the remaining levels. Electronically Signed   By: Kellie Simmering DO   On: 01/11/2020 11:20    Procedures Procedures (including critical care time)  Medications Ordered in ED Medications  LORazepam (ATIVAN) injection 1 mg (1 mg Intravenous Given 01/11/20 1030)  diclofenac (FLECTOR) 1.3 % 1 patch (1 patch Transdermal Patch Applied 01/11/20 1118)  fentaNYL (SUBLIMAZE) injection 50 mcg (has no administration in time range)  ketorolac (TORADOL) 30 MG/ML injection 15 mg (15 mg Intravenous Given 01/11/20 1023)  dexamethasone (DECADRON) injection 10 mg (10 mg Intravenous Given 01/11/20 1023)    ED Course  I have reviewed the triage vital signs and the nursing notes.  Pertinent labs & imaging results that were available during my care of the patient were reviewed by me and considered in my medical decision making (see chart for details).   12:46 PM I discussed patient's case with our neurosurgeon Dr. Zada Finders.  With concern for weakness, pain, disc herniation with impingement on L4 nerve root he will be transferred to our affiliated facility for surgical repair. Subsequently discussed this conversation with the patient and his wife, who is a Marine scientist, via speaker phone.  They are aware of all findings, results, plan.  Patient has new complaints of superior to his lumbar spine, is awake, alert, hemodynamically unremarkable.  However, with weakness, in his lower  extremity, concern for disc herniation, as above, need for repair, patient will be transferred,.  Labs pending, Covid test pending Final Clinical Impression(s) / ED Diagnoses Final diagnoses:  Lumbar disc herniation     Carmin Muskrat, MD 01/11/20 1252    Carmin Muskrat, MD 01/11/20 1253

## 2020-01-11 NOTE — Anesthesia Postprocedure Evaluation (Signed)
Anesthesia Post Note  Patient: Quamir Malis Sr.  Procedure(s) Performed: RIGHT LUMBAR FOUR-FIVE MINIMALLY INVASIVE LUMBAR DISCECTOMY (Right Spine Lumbar)     Patient location during evaluation: PACU Anesthesia Type: General Level of consciousness: awake and alert Pain management: pain level controlled Vital Signs Assessment: post-procedure vital signs reviewed and stable Respiratory status: spontaneous breathing, nonlabored ventilation, respiratory function stable and patient connected to nasal cannula oxygen Cardiovascular status: blood pressure returned to baseline and stable Postop Assessment: no apparent nausea or vomiting Anesthetic complications: no    Last Vitals:  Vitals:   01/11/20 2145 01/11/20 2201  BP: 114/78 125/81  Pulse: 86 87  Resp: 17 18  Temp: 36.5 C 36.6 C  SpO2: 98% 100%    Last Pain:  Vitals:   01/11/20 2330  TempSrc:   PainSc: 4                  Donneisha Beane COKER

## 2020-01-11 NOTE — Brief Op Note (Signed)
01/11/2020  8:04 PM  PATIENT:  Caryn Bee Sr.  59 y.o. male  PRE-OPERATIVE DIAGNOSIS:  HERNIATED NUCLEUS PROPULSUS  POST-OPERATIVE DIAGNOSIS:  HERNIATED NUCLEUS PROPULSUS  PROCEDURE:  Procedure(s): RIGHT LUMBAR FOUR-FIVE MINIMALLY INVASIVE LUMBAR DISCECTOMY (Right)  SURGEON:  Surgeon(s) and Role:    * Kenyada Hy, Joyice Faster, MD - Primary  PHYSICIAN ASSISTANT:   ASSISTANTS: none   ANESTHESIA:   general  EBL:  30cc  BLOOD ADMINISTERED:none  DRAINS: none   LOCAL MEDICATIONS USED:  LIDOCAINE   SPECIMEN:  No Specimen  DISPOSITION OF SPECIMEN:  N/A  COUNTS:  YES  TOURNIQUET:  * No tourniquets in log *  DICTATION: .Note written in EPIC  PLAN OF CARE: Admit for overnight observation  PATIENT DISPOSITION:  PACU - hemodynamically stable.   Delay start of Pharmacological VTE agent (>24hrs) due to surgical blood loss or risk of bleeding: yes

## 2020-01-12 MED ORDER — LORAZEPAM 0.5 MG PO TABS
1.0000 mg | ORAL_TABLET | Freq: Four times a day (QID) | ORAL | Status: DC | PRN
  Administered 2020-01-12 – 2020-01-13 (×3): 1 mg via ORAL
  Filled 2020-01-12 (×2): qty 2
  Filled 2020-01-12: qty 4

## 2020-01-12 MED ORDER — LORAZEPAM 2 MG/ML IJ SOLN: 1.0000 mg | INTRAMUSCULAR | Status: DC | PRN

## 2020-01-12 NOTE — Evaluation (Signed)
Occupational Therapy Evaluation Patient Details Name: [name] Luetkemeyer Sr. MRN: [id number] DOB: 24-Jan-1961 Today's Date: 01/12/2020    History of Present Illness 59 y/o male s/p L4-5 lumbar discectomy. PMH includes multiple abdominal surgeries, arthritis, anxiety, HTN.   Clinical Impression   PTA pt living independently with spouse, did have 2-3 day period of not being able to walk prior to sx due to extremelt debilitating back pain. At time of eval, pt is able to complete bed mobility and sit <> stands at min guard level. Pt demonstrated toilet transfer at min guard level of assist. Pt has comfort height toilets at home. He currently requires min A for LB dressing 2/2 pain, but prefers to have wife assist and determined to keep working on this for independence. Educated pt on doing heel slides to improve ability to reach figure 4 position for LB dressing. Back handout provided and reviewed adls in detail. Pt educated on: avoid sitting for long periods of time, correct bed positioning for sleeping, correct sequence for bed mobility, avoiding lifting more than 5 pounds and never wash directly over incision. All education is complete and patient indicates understanding. No further OT follow up is indicated, OT will sign off. Thank you for this consult.    Follow Up Recommendations  No OT follow up    Equipment Recommendations  None recommended by OT    Recommendations for Other Services       Precautions / Restrictions Precautions Precautions: Back Precaution Booklet Issued: Yes (comment) Precaution Comments: reviewed in context of BADL Required Braces or Orthoses: Spinal Brace Spinal Brace: Lumbar corset Restrictions Weight Bearing Restrictions: No      Mobility Bed Mobility Overal bed mobility: Needs Assistance Bed Mobility: Rolling;Sit to Sidelying Rolling: Min guard       Sit to sidelying: Min assist General bed mobility comments: min guard, good use of log rolling  technique; guarded 2/2 pain  Transfers Overall transfer level: Needs assistance Equipment used: Straight cane Transfers: Sit to/from Stand Sit to Stand: Min guard;From elevated surface         General transfer comment: min guard to rise and steady; guarded 2/2 pain    Balance Overall balance assessment: Mild deficits observed, not formally tested                                         ADL either performed or assessed with clinical judgement   ADL Overall ADL's : Needs assistance/impaired Eating/Feeding: Independent   Grooming: Independent   Upper Body Bathing: Minimal assistance;Sitting   Lower Body Bathing: Minimal assistance;Sit to/from stand;Cueing for back precautions   Upper Body Dressing : Set up;Sitting   Lower Body Dressing: Minimal assistance;Sit to/from stand;Cueing for back precautions   Toilet Transfer: Supervision/safety;Comfort height toilet   Toileting- Clothing Manipulation and Hygiene: Supervision/safety;Cueing for back precautions   Tub/ Shower Transfer: Supervision/safety;Cueing for safety;Ambulation   Functional mobility during ADLs: Supervision/safety;Cueing for safety       Vision Patient Visual Report: No change from baseline       Perception     Praxis      Pertinent Vitals/Pain Pain Assessment: Faces Pain Score: 6  Faces Pain Scale: Hurts even more Pain Location: lumbar incisional site Pain Descriptors / Indicators: Aching;Sore Pain Intervention(s): Limited activity within patient's tolerance;Monitored during session;Repositioned     Hand Dominance Right   Extremity/Trunk Assessment Upper Extremity Assessment Upper Extremity  Assessment: Overall WFL for tasks assessed   Lower Extremity Assessment Lower Extremity Assessment: Defer to PT evaluation   Cervical / Trunk Assessment Cervical / Trunk Assessment: Other exceptions Cervical / Trunk Exceptions: s/p lumbar surgery   Communication  Communication Communication: No difficulties   Cognition Arousal/Alertness: Awake/alert Behavior During Therapy: WFL for tasks assessed/performed Overall Cognitive Status: Within Functional Limits for tasks assessed                                    General Comments       Exercises    Shoulder Instructions      Home Living Family/patient expects to be discharged to:: Private residence Living Arrangements: Spouse/significant other;Children(wife home 24/7; son works full time) Available Help at Discharge: Family;Available 24 hours/day Type of Home: House Home Access: Stairs to enter [employer] of Steps: 8-10 Entrance Stairs-Rails: Right Home Layout: Two level;Able to live on main level with bedroom/bathroom Alternate Level Stairs-Number of Steps: full flight   Bathroom Shower/Tub: Occupational psychologist: Handicapped height     Home Equipment: Shower seat;Grab bars - toilet          Prior Functioning/Environment Level of Independence: Independent with assistive device(s)        Comments: Pt with intermittent use of cane for ambulation PTA, states over the past 3 days PTA ambulation was impossible due to LE and back pain.        OT Problem List: Decreased knowledge of use of DME or AE;Decreased knowledge of precautions;Pain      OT Treatment/Interventions:      OT Goals(Current goals can be found in the care plan section) Acute Rehab OT Goals Patient Stated Goal: go home OT Goal Formulation: With patient Time For Goal Achievement: 01/26/20 Potential to Achieve Goals: Good  OT Frequency:     Barriers to D/C:            Co-evaluation              AM-PAC OT "6 Clicks" Daily Activity     Outcome Measure Help from another person eating meals?: None Help from another person taking care of personal grooming?: None Help from another person toileting, which includes using toliet, bedpan, or urinal?: None Help from  another person bathing (including washing, rinsing, drying)?: A Little Help from another person to put on and taking off regular upper body clothing?: None Help from another person to put on and taking off regular lower body clothing?: A Little 6 Click Score: 22   End of Session Equipment Utilized During Treatment: Gait belt;Back brace Nurse Communication: Mobility status;Precautions  Activity Tolerance: Patient tolerated treatment well Patient left: Other (comment)(walking in hall with PT)  OT Visit Diagnosis: Unsteadiness on feet (R26.81);Other abnormalities of gait and mobility (R26.89);Pain Pain - part of body: (lumbar incisional site)                Time: [id number] OT Time Calculation (min): 25 min Charges:  OT General Charges $OT Visit: 1 Visit OT Evaluation $OT Eval Low Complexity: 1 Low OT Treatments $Self Care/Home Management : 8-22 mins  Zenovia Jarred, MSOT, OTR/L Acute Rehabilitation Services [facility] Office Number: [phone] Pager: [phone]  Zenovia Jarred 01/12/2020, 11:46 AM

## 2020-01-12 NOTE — Progress Notes (Signed)
Neurosurgery Service Progress Note  Subjective: No acute events overnight, pain significantly improved post-op, had some recurrence of his right hip pain over R greater troch while ambulating this morning, weakness resolved   Objective: Vitals:   01/11/20 2201 01/12/20 0000 01/12/20 0325 01/12/20 0726  BP: 125/81 122/68 128/77 111/75  Pulse: 87 98 94 (!) 107  Resp: 18 20 20 18   Temp: 97.8 F (36.6 C) 97.9 F (36.6 C) 97.9 F (36.6 C) 98.6 F (37 C)  TempSrc: Oral Oral Oral Oral  SpO2: 100% 96% 99% 98%  Weight:      Height:       Temp (24hrs), Avg:97.9 F (36.6 C), Min:97.6 F (36.4 C), Max:98.6 F (37 C)  CBC Latest Ref Rng & Units 01/11/2020 11/13/2019 11/12/2019  WBC 4.0 - 10.5 K/uL 7.6 9.6 10.2  Hemoglobin 13.0 - 17.0 g/dL 12.3(L) 11.2(L) 12.1(L)  Hematocrit 39.0 - 52.0 % 38.9(L) 35.2(L) 37.6(L)  Platelets 150 - 400 K/uL 285 258 274   BMP Latest Ref Rng & Units 01/11/2020 11/13/2019 11/12/2019  Glucose 70 - 99 mg/dL 98 97 90  BUN 6 - 20 mg/dL 13 18 13   Creatinine 0.61 - 1.24 mg/dL 0.94 1.71(H) 1.11  Sodium 135 - 145 mmol/L 139 140 140  Potassium 3.5 - 5.1 mmol/L 4.5 4.0 3.5  Chloride 98 - 111 mmol/L 110 108 108  CO2 22 - 32 mmol/L 21(L) 23 22  Calcium 8.9 - 10.3 mg/dL 8.9 8.7(L) 8.8(L)    Intake/Output Summary (Last 24 hours) at 01/12/2020 0833 Last data filed at 01/11/2020 2130 Gross per 24 hour  Intake 1950 ml  Output 300 ml  Net 1650 ml    Current Facility-Administered Medications:  .  0.9 %  sodium chloride infusion, 250 mL, Intravenous, Continuous, Kaylob Wallen A, MD .  acetaminophen (TYLENOL) tablet 650 mg, 650 mg, Oral, Q4H PRN, 650 mg at 01/12/20 0422 **OR** acetaminophen (TYLENOL) suppository 650 mg, 650 mg, Rectal, Q4H PRN, Judith Part, MD .  amLODipine (NORVASC) tablet 10 mg, 10 mg, Oral, Daily, Isabellah Sobocinski A, MD .  cyclobenzaprine (FLEXERIL) tablet 10 mg, 10 mg, Oral, TID PRN, Judith Part, MD, 10 mg at 01/12/20 0316 .   diclofenac (FLECTOR) 1.3 % 1 patch, 1 patch, Transdermal, BID, Judith Part, MD, 1 patch at 01/11/20 1118 .  docusate sodium (COLACE) capsule 100 mg, 100 mg, Oral, BID, Judith Part, MD, 100 mg at 01/11/20 2239 .  famotidine (PEPCID) tablet 40 mg, 40 mg, Oral, Daily, Aryka Coonradt A, MD .  hydrochlorothiazide 10 mg/mL oral suspension 3.125 mg, 3.125 mg, Oral, Daily, Jesua Tamblyn A, MD .  HYDROmorphone (DILAUDID) injection 1 mg, 1 mg, Intravenous, Q3H PRN, Judith Part, MD .  irbesartan (AVAPRO) tablet 300 mg, 300 mg, Oral, Daily, Jadelin Eng A, MD .  LORazepam (ATIVAN) injection 1 mg, 1 mg, Intravenous, Q4H PRN, Judith Part, MD, 1 mg at 01/11/20 1030 .  LORazepam (ATIVAN) tablet 1-2 mg, 1-2 mg, Oral, BID PRN, Judith Part, MD .  menthol-cetylpyridinium (CEPACOL) lozenge 3 mg, 1 lozenge, Oral, PRN **OR** phenol (CHLORASEPTIC) mouth spray 1 spray, 1 spray, Mouth/Throat, PRN, Ayden Apodaca A, MD .  ondansetron (ZOFRAN) tablet 4 mg, 4 mg, Oral, Q6H PRN **OR** ondansetron (ZOFRAN) injection 4 mg, 4 mg, Intravenous, Q6H PRN, Rowen Hur A, MD .  oxyCODONE (Oxy IR/ROXICODONE) immediate release tablet 10 mg, 10 mg, Oral, Q4H PRN, Judith Part, MD, 10 mg at 01/12/20 0313 .  oxyCODONE (Oxy  IR/ROXICODONE) immediate release tablet 15 mg, 15 mg, Oral, Q4H PRN, Judith Part, MD, 15 mg at 01/12/20 0629 .  polyethylene glycol (MIRALAX / GLYCOLAX) packet 17 g, 17 g, Oral, Daily PRN, Mayrani Khamis A, MD .  sodium chloride flush (NS) 0.9 % injection 3 mL, 3 mL, Intravenous, Q12H, Deundra Bard A, MD .  sodium chloride flush (NS) 0.9 % injection 3 mL, 3 mL, Intravenous, PRN, Judith Part, MD .  traZODone (DESYREL) tablet 75 mg, 75 mg, Oral, QHS, Judith Part, MD, 75 mg at 01/11/20 2239 .  vortioxetine HBr (TRINTELLIX) tablet 20 mg, 20 mg, Oral, Daily, Malak Orantes, Joyice Faster, MD   Physical Exam: AOx3, PERRL, EOMI, FS,  Strength 5/5 x4, SILTx4 Incision c/d/i  Assessment & Plan: 59 y.o. man s/p R L4-5 far lateral MIS discectomy, preop weakness resolved post-op, still with some mild residual R hip pain.  -discussed normal post-op course w/ pt, reiterated that he may also have some R hip pathology. Recommend a short course of steroids for the hip to see if some bursitis / intertrochanteric arthritis would respond, he wants to think about it, didn't like taking steroids in the past -cont PT/OT, nervous about ambulating at home w/ stairs -likely discharge home tomorrow  Judith Part  01/12/20 8:33 AM

## 2020-01-12 NOTE — Evaluation (Signed)
Physical Therapy Evaluation Patient Details Name: [name] [name]. MRN: [id number] DOB: May 02, 1961 Today's Date: 01/12/2020   History of Present Illness  59 y/o male s/p L4-5 lumbar discectomy. PMH includes multiple abdominal surgeries, arthritis, anxiety, HTN.  Clinical Impression  Pt presents with decreased knowledge and application of spinal precautions, increased time and effort to mobilize, decreased activity tolerance. Pt to benefit from acute PT to address deficits. Pt ambulated hallway distance with use of cane and min guard for safety and form, PT reinforcing precautions throughout mobility. PT to progress mobility as tolerated, and will continue to follow acutely.     Follow Up Recommendations Follow surgeon's recommendation for DC plan and follow-up therapies;Supervision for mobility/OOB    Equipment Recommendations  None recommended by PT    Recommendations for Other Services       Precautions / Restrictions Precautions Precautions: Back Precaution Booklet Issued: Yes (comment) Precaution Comments: OT administered handout, PT verbally reviewed no bending, lifting, twisting with pt able to teach back 2/3 Required Braces or Orthoses: Spinal Brace Spinal Brace: Lumbar corset Restrictions Weight Bearing Restrictions: No      Mobility  Bed Mobility Overal bed mobility: Needs Assistance Bed Mobility: Rolling;Sit to Sidelying Rolling: Min guard       Sit to sidelying: Min assist General bed mobility comments: Pt up at EOB with OT upon PT arrival. Requires min guard for rolling technique with verbal cuing for log roll, min assist for sit to sidelying for LE lifting and verbal cuing for sequencing.  Transfers Overall transfer level: Needs assistance Equipment used: Straight cane Transfers: Sit to/from Stand Sit to Stand: Min guard;From elevated surface         General transfer comment: min guard for safety to transition from stand to sit, pt with good form  with sitting (bends from hips and knees)  Ambulation/Gait Ambulation/Gait assistance: Min guard Gait Distance (Feet): 300 Feet Assistive device: Straight cane Gait Pattern/deviations: Step-through pattern;Decreased stride length Gait velocity: decr   General Gait Details: Min guard for safety, verbal cuing for turning trunk and LEs simultaneously when changing direction to avoid twisting spine. Additional cuing for upright posture, otherwise good sequencing with cane and form during gait.  Stairs Stairs: Yes Stairs assistance: Min guard Stair Management: One rail Right;With cane;Step to pattern;Forwards Number of Stairs: 10 General stair comments: Min guard for safety, verbal cuing for sequencing steps with cane during ascending. Pt did not use cane for descending with good balance and no unsteadiness noted.  Wheelchair Mobility    Modified Rankin (Stroke Patients Only)       Balance Overall balance assessment: Mild deficits observed, not formally tested                                           Pertinent Vitals/Pain Pain Assessment: 0-10 Pain Score: 6  Faces Pain Scale: Hurts even more Pain Location: lumbar incisional site Pain Descriptors / Indicators: Aching;Sore Pain Intervention(s): Limited activity within patient's tolerance;Monitored during session;Repositioned    Home Living Family/patient expects to be discharged to:: Private residence Living Arrangements: Spouse/significant other;Children(wife home 24/7; son works full time) Available Help at Discharge: Family;Available 24 hours/day Type of Home: House Home Access: Stairs to enter Entrance Stairs-Rails: Right Entrance Stairs-Number of Steps: 8-10 Home Layout: Two level;Able to live on main level with bedroom/bathroom Home Equipment: Shower seat;Grab bars - toilet  Prior Function Level of Independence: Independent with assistive device(s)         Comments: Pt with intermittent use  of cane for ambulation PTA, states over the past 3 days PTA ambulation was impossible due to LE and back pain.     Hand Dominance   Dominant Hand: Right    Extremity/Trunk Assessment   Upper Extremity Assessment Upper Extremity Assessment: Defer to OT evaluation    Lower Extremity Assessment Lower Extremity Assessment: Overall WFL for tasks assessed    Cervical / Trunk Assessment Cervical / Trunk Assessment: Other exceptions Cervical / Trunk Exceptions: s/p lumbar surgery  Communication   Communication: No difficulties  Cognition Arousal/Alertness: Awake/alert Behavior During Therapy: WFL for tasks assessed/performed Overall Cognitive Status: Within Functional Limits for tasks assessed                                 General Comments: pleasant, responds well to verbal cuing      General Comments      Exercises Other Exercises Other Exercises: walking program - up and walking 1x/waking hours for post-op circulation and to minimize poor posture and back discomfort/stiffness. With supervision from wife.   Assessment/Plan    PT Assessment Patient needs continued PT services  PT Problem List Decreased strength;Decreased mobility;Decreased knowledge of precautions;Decreased activity tolerance;Decreased balance;Pain;Decreased knowledge of use of DME       PT Treatment Interventions DME instruction;Therapeutic activities;Gait training;Therapeutic exercise;Patient/family education;Stair training;Balance training;Functional mobility training    PT Goals (Current goals can be found in the Care Plan section)  Acute Rehab PT Goals Patient Stated Goal: go home PT Goal Formulation: With patient Time For Goal Achievement: 01/19/20 Potential to Achieve Goals: Good    Frequency Min 5X/week   Barriers to discharge        Co-evaluation               AM-PAC PT "6 Clicks" Mobility  Outcome Measure Help needed turning from your back to your side while in a  flat bed without using bedrails?: A Little Help needed moving from lying on your back to sitting on the side of a flat bed without using bedrails?: A Little Help needed moving to and from a bed to a chair (including a wheelchair)?: A Little Help needed standing up from a chair using your arms (e.g., wheelchair or bedside chair)?: A Little Help needed to walk in hospital room?: A Little Help needed climbing 3-5 steps with a railing? : A Little 6 Click Score: 18    End of Session   Activity Tolerance: Patient tolerated treatment well Patient left: in bed;with call bell/phone within reach Nurse Communication: Mobility status PT Visit Diagnosis: Other abnormalities of gait and mobility (R26.89)    Time: [id number] PT Time Calculation (min) (ACUTE ONLY): 21 min   Charges:   PT Evaluation $PT Eval Low Complexity: 1 Low        Kanetra Ho E, PT Acute Rehabilitation Services Pager [phone]  Office [phone]  Malon Siddall D Latonya Nelon 01/12/2020, 11:02 AM

## 2020-01-13 ENCOUNTER — Emergency Department (HOSPITAL_BASED_OUTPATIENT_CLINIC_OR_DEPARTMENT_OTHER): Payer: Medicare HMO

## 2020-01-13 ENCOUNTER — Emergency Department (HOSPITAL_COMMUNITY)
Admission: EM | Admit: 2020-01-13 | Discharge: 2020-01-13 | Disposition: A | Discharge status: Home / Self Care | Payer: Medicare HMO | Source: Home / Self Care | Attending: Emergency Medicine | Admitting: Emergency Medicine

## 2020-01-13 ENCOUNTER — Encounter (HOSPITAL_COMMUNITY): Payer: Self-pay

## 2020-01-13 ENCOUNTER — Other Ambulatory Visit: Payer: Self-pay

## 2020-01-13 DIAGNOSIS — R609 Edema, unspecified: Secondary | ICD-10-CM

## 2020-01-13 DIAGNOSIS — M79661 Pain in right lower leg: Secondary | ICD-10-CM | POA: Diagnosis not present

## 2020-01-13 DIAGNOSIS — M5416 Radiculopathy, lumbar region: Secondary | ICD-10-CM | POA: Diagnosis not present

## 2020-01-13 LAB — I-STAT CHEM 8, ED
BUN: 15 mg/dL (ref 6–20)
Calcium, Ion: 1.2 mmol/L (ref 1.15–1.40)
Chloride: 106 mmol/L (ref 98–111)
Creatinine, Ser: 0.9 mg/dL (ref 0.61–1.24)
Glucose, Bld: 94 mg/dL (ref 70–99)
HCT: 34 % — ABNORMAL LOW (ref 39.0–52.0)
Hemoglobin: 11.6 g/dL — ABNORMAL LOW (ref 13.0–17.0)
Potassium: 4.3 mmol/L (ref 3.5–5.1)
Sodium: 141 mmol/L (ref 135–145)
TCO2: 26 mmol/L (ref 22–32)

## 2020-01-13 MED ORDER — OXYCODONE HCL 5 MG PO TABS
10.0000 mg | ORAL_TABLET | Freq: Once | ORAL | Status: AC
  Administered 2020-01-13: 10 mg via ORAL
  Filled 2020-01-13: qty 2

## 2020-01-13 MED ORDER — CYCLOBENZAPRINE HCL 10 MG PO TABS: 5.0000 mg | ORAL_TABLET | Freq: Three times a day (TID) | ORAL | 0 refills | Status: DC | PRN

## 2020-01-13 MED ORDER — OXYCODONE HCL 5 MG PO TABS: 5.0000 mg | ORAL_TABLET | ORAL | 0 refills | Status: AC | PRN

## 2020-01-13 NOTE — ED Triage Notes (Signed)
Pt was sent by PCP to rule out DVT in right leg. Pt had surgery on Wednesday for a herniated disk. Pt reports today he started experiencing pain in his right calf, called PCP and told him to come to ED. Pt is not on blood thinners. Pt is A&Ox4.

## 2020-01-13 NOTE — Progress Notes (Signed)
Right lower extremity venous duplex completed. Refer to "CV Proc" under chart review to view preliminary results.  01/13/2020 7:11 PM Kelby Aline., MHA, RVT, RDCS, RDMS

## 2020-01-13 NOTE — ED Notes (Signed)
Pt verbalizes understanding of DC instructions. Pt belongings returned and is ambulatory out of ED.  

## 2020-01-13 NOTE — ED Notes (Signed)
Vascular US at bedside.

## 2020-01-13 NOTE — ED Provider Notes (Signed)
[facility] DEPT Provider Note   CSN: [id number] Arrival date & time: 01/13/20  1801     History Chief Complaint  Patient presents with  . Leg Pain    [name] [name]. is a 59 y.o. male.  The history is provided by the patient and medical records. No language interpreter was used.  Leg Pain Associated symptoms: no fever      59 year old male with hx of HTN, anemia, anxiety, arthritis, tobacco abuse, sent here by PCP to r/o DVT of R leg.  Pt had back surgery for herniated disc 3 days ago. Surgeon was Dr. Zada Finders.  He was released from the hospital today.  While ambulating pt noticed pain to his R calf.  Described as a soreness sensation 5/10, non radiating, more noticeable when ambulating.  Minimal pain at rest.  No cp or sob.  Some throat discomfort from previous intubating.  Denies fever or chills.  Denies numbness or rash.  No prior hx of DVT.    Past Medical History:  Diagnosis Date  . Anemia   . Anxiety   . Arthritis    neck, shoulder, left knee  . 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 Erythrocytosis 11/30/2014  . Essential hypertension   . GERD (gastroesophageal reflux disease)   . Headache(784.0)    2 x/week  . History of kidney stones   . Hypothyroidism    hx of   . Leukocytosis, unspecified 11/03/2013  . Limited joint range of motion    cervical fusion 02/2013  . Medial meniscus tear 06/2013   left knee  . 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 Throat and mouth symptom    itchy throat x 3 weeks  . Thyroid nodule   . Tobacco abuse   . Wears partial dentures    upper    Patient Active Problem List   Diagnosis Date Noted  . Lumbar radiculopathy 01/11/2020  . PVC (premature ventricular contraction)   . Anxiety state 08/14/2017  . Hypomagnesemia 06/28/2017  . Hypothyroidism   . 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 Protein-calorie malnutrition, severe  ([place]) 06/27/2017  . 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 Pressure injury of skin 05/29/2017  . Ileus ([place])   . Hypokalemia 05/14/2017  . Anastomotic leak of intestine 05/14/2017  . Wound dehiscence, surgical 05/14/2017  . Aspiration pneumonia ([place]) 05/14/2017  . Chronic narcotic use 05/10/2017  . Generalized anxiety disorder 05/10/2017  . Intra-abdominal adhesions s/p SB resection 05/06/2017 05/09/2017  . Throat symptom 03/18/2016  . Multinodular goiter 01/19/2016  . Hyperthyroidism 01/19/2016  . Essential hypertension   . Diarrhea 11/30/2014  . Anemia, iron deficiency 11/01/2014  . Leukocytosis 11/03/2013  . Tobacco abuse 07/26/2013  . COPD (chronic obstructive pulmonary disease) ([place]) 07/26/2013  . Left knee pain 07/26/2013  . Pain in joint, shoulder region 05/03/2013  . GERD 07/24/2008  . 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 ABDOMINAL SURGERY    . ANTERIOR CERVICAL DECOMP/DISCECTOMY FUSION N/A 03/18/2013   Procedure: ANTERIOR CERVICAL DECOMPRESSION/DISCECTOMY FUSION 3 LEVELS;  Surgeon: Erline Levine, MD;  Location: [facility];  Service: Neurosurgery;  Laterality: N/A;  Anterior Cervical Three-Six  Decompression/Diskectomy/Fusion  . APPENDECTOMY    . CIRCUMCISION  05/04/2006  . COLONOSCOPY    . CYSTOSCOPY/URETEROSCOPY/HOLMIUM LASER/STENT PLACEMENT Left 11/24/2018   Procedure: CYSTOSCOPY/URETEROSCOPY/HOLMIUM LASER/STENT PLACEMENT;  Surgeon:  Lucas Mallow, MD;  Location: [facility];  Service: Urology;  Laterality: Left;  . DECOMPRESSIVE LUMBAR LAMINECTOMY LEVEL 1 Right 01/11/2020   Procedure: RIGHT LUMBAR FOUR-FIVE MINIMALLY INVASIVE LUMBAR DISCECTOMY;  Surgeon: Judith Part, MD;  Location: [facility];  Service: Neurosurgery;  Laterality: Right;  . EYE SURGERY     bilateral cataract surgery   . HEMICOLECTOMY Right [id number]   with ileocolic anastomosis    . INGUINAL HERNIA REPAIR    . INSERTION OF MESH N/A 07/31/2017   Procedure: INSERTION OF MESH;  Surgeon: Johnathan Hausen, MD;  Location: [facility];  Service: General;  Laterality: N/A;  . KNEE ARTHROSCOPY Left 08/01/2013   Procedure: LEFT ARTHROSCOPY KNEE;  Surgeon: Kerin Salen, MD;  Location: [facility];  Service: Orthopedics;  Laterality: Left;  . LAPAROSCOPIC ADRENALECTOMY Right 05/06/2017   Procedure: OPEN RIGHT ADRENALECTOMY WITH SMALL BOWEL RESECTION AND ANASTOMOSIS;  Surgeon: Kieth Brightly, Arta Bruce, MD;  Location: [facility];  Service: General;  Laterality: Right;  . LAPAROSCOPIC LYSIS OF ADHESIONS N/A 07/31/2017   Procedure: LYSIS OF ADHESIONS;  Surgeon: Johnathan Hausen, MD;  Location: [facility];  Service: General;  Laterality: N/A;  . LAPAROSCOPY N/A 07/31/2017   Procedure: LAPAROSCOPY DIAGNOSTIC;  Surgeon: Johnathan Hausen, MD;  Location: [facility];  Service: General;  Laterality: N/A;  . LAPAROTOMY N/A 05/13/2017   Procedure: EXPLORATORY LAPAROTOMY, with wound dehisense, and repair of anastamotic leak;  Surgeon: Mickeal Skinner, MD;  Location: [facility];  Service: General;  Laterality: N/A;  . LAPAROTOMY N/A 05/14/2017   Procedure: EXPLORATORY LAPAROTOMY, LYSIS OF ADHESIONS/ SMALL BOWEL RESECTION/ WASHOUT OF ABDOMEN AND PLACEMENT OF NEGATIVE PRESSURE DRESSING;  Surgeon: Kieth Brightly, Arta Bruce, MD;  Location: [facility];  Service: General;  Laterality: N/A;  . LAPAROTOMY N/A 05/17/2017   Procedure: EXPLORATORY LAPAROTOMY, PLACEMENT OF ILEOSTOMY TUBES, PARTIAL CLOSURE OF FASCIA, WOUND VAC EXCHANGE;  Surgeon: [name], Arta Bruce, MD;  Location: [facility];  Service: General;  Laterality: N/A;  . LAPAROTOMY N/A 05/20/2017   Procedure: ABDOMINAL WOUND [place] OUT, WOUND CLOSURE AND WOUND VAC PLACEMENT;  Surgeon: [name], Arta Bruce, MD;  Location: [facility];  Service: General;  Laterality: N/A;  . LAPAROTOMY N/A 07/31/2017   Procedure: EXPLORATORY LAPAROTOMY WITH CLOSURE OF ENTEROTOMICS;  Surgeon:  Johnathan Hausen, MD;  Location: [facility];  Service: General;  Laterality: N/A;  . SHOULDER ARTHROSCOPY W/ ROTATOR CUFF REPAIR Left 07/16/2010  . SMALL INTESTINE SURGERY  04/24/2000   adhesiolysis, ileocolic anastomosis  . UPPER GASTROINTESTINAL ENDOSCOPY         Family History  Problem Relation Age of Onset  . Colon cancer Father 31  . Breast cancer Sister 23  . Thyroid disease Neg Hx     Social History   Tobacco Use  . Smoking status: Former Smoker    Packs/day: 1.00    Years: 25.00    Pack years: 25.00    Types: Cigarettes    Quit date: 2017    Years since quitting: 4.2  . Smokeless tobacco: Never Used  . Tobacco comment: quit smoking 3 years ago  Substance Use Topics  . Alcohol use: Yes    Comment: occasionally  . Drug use: No    Home Medications Prior to Admission medications   Medication Sig Start Date End Date Taking? Authorizing Provider  amLODipine (NORVASC) 10 MG tablet Take 10 mg by mouth daily.  03/11/19   [provider]  betamethasone dipropionate 0.05 % cream Apply 1 application topically 2 (  two) times daily.  01/04/20   [provider]  cyclobenzaprine (FLEXERIL) 10 MG tablet Take 0.5-1 tablets (5-10 mg total) by mouth 3 (three) times daily as needed for muscle spasms. 01/13/20   Judith Part, MD  diclofenac sodium (VOLTAREN) 1 % GEL Apply 4 g 4 (four) times daily topically. Patient not taking: Reported on 01/11/2020 09/04/17   Kalman Drape, PA  famotidine (PEPCID) 40 MG tablet Take 40 mg by mouth daily.    [provider]  fluticasone (FLONASE) 50 MCG/ACT nasal spray Place 1 spray into both nostrils daily. Patient not taking: Reported on 11/12/2019 09/10/19   Jacqualine Mau, NP  gabapentin (NEURONTIN) 100 MG capsule Take 1 capsule (100 mg total) by mouth 2 (two) times daily. Patient not taking: Reported on 01/11/2020 12/12/19 12/11/20  Arfeen, Arlyce Harman, MD  hydrochlorothiazide (HYDRODIURIL) 12.5 MG tablet Take 3.125 mg by  mouth daily.  09/01/19   [provider]  ibuprofen (ADVIL) 200 MG tablet Take 1,200 mg by mouth every 6 (six) hours as needed for moderate pain.    [provider]  LORazepam (ATIVAN) 2 MG tablet Take 1-2 mg by mouth 2 (two) times daily as needed for anxiety.  08/26/19   [provider]  Magnesium 200 MG TABS Take 200 mg by mouth every other day.     [provider]  Multiple Vitamin (MULTIVITAMIN WITH MINERALS) TABS Take 1 tablet by mouth daily.    [provider]  oxyCODONE (ROXICODONE) 5 MG immediate release tablet Take 1 tablet (5 mg total) by mouth every 4 (four) hours as needed for severe pain. Take with your percocet 10-325 as needed for pain 01/13/20 01/12/21  Judith Part, MD  oxyCODONE-acetaminophen (PERCOCET) 10-325 MG tablet Take 1 tablet by mouth 4 (four) times daily.    [provider]  oxymetazoline (AFRIN NASAL SPRAY) 0.05 % nasal spray Place 1 spray into both nostrils 2 (two) times daily. Patient not taking: Reported on 11/12/2019 09/10/19   Jacqualine Mau, NP  Probiotic Product (PROBIOTIC PO) Take 1 capsule by mouth daily.    [provider]  sildenafil (VIAGRA) 100 MG tablet Take 100 mg by mouth daily as needed for erectile dysfunction.    [provider]  tamsulosin (FLOMAX) 0.4 MG CAPS capsule Take 1 capsule (0.4 mg total) by mouth daily after supper. Patient not taking: Reported on 01/11/2020 11/12/19   Delia Heady, PA-C  telmisartan (MICARDIS) 80 MG tablet Take 80 mg by mouth at bedtime.    [provider]  traZODone (DESYREL) 100 MG tablet Take 75 mg by mouth at bedtime.     [provider]  vortioxetine HBr (TRINTELLIX) 20 MG TABS tablet Take 1 tablet (20 mg total) by mouth daily. 11/01/19   Arfeen, Arlyce Harman, MD  simethicone (MYLICON) 80 MG chewable tablet Chew 80 mg by mouth every 6 (six) hours as needed for flatulence.  02/01/14  [provider]    Allergies    Chantix  [varenicline], Morphine, and Penicillins  Review of Systems   Review of Systems  Constitutional: Negative for fever.  Skin: Negative for rash and wound.  Neurological: Negative for numbness.    Physical Exam Updated Vital Signs BP 140/84   Pulse 78   Temp 98.2 F (36.8 C)   Resp 18   Ht 5\' 11"  (1.803 m)   Wt 89.8 kg   SpO2 99%   BMI 27.62 kg/m   Physical Exam Vitals and nursing  note reviewed.  Constitutional:      General: He is not in acute distress.    Appearance: He is well-developed.  HENT:     Head: Atraumatic.  Eyes:     Conjunctiva/sclera: Conjunctivae normal.  Musculoskeletal:        General: Tenderness (RLE: tenderness to calf in palpation with positive Homen sign.  No appreciable edema or warmth noted.  intact DP/PT pulses.) present.     Cervical back: Neck supple.  Skin:    Findings: No rash.  Neurological:     Mental Status: He is alert.     ED Results / Procedures / Treatments   Labs (all labs ordered are listed, but only abnormal results are displayed) Labs Reviewed  I-STAT CHEM 8, ED - Abnormal; Notable for the following components:      Result Value   Hemoglobin 11.6 (*)    HCT 34.0 (*)    All other components within normal limits    EKG None  Radiology VAS [name] LOWER EXTREMITY VENOUS (DVT) (ONLY [facility] & [facility] 7a-7p)  Result Date: 01/13/2020  Lower Venous DVTStudy Indications: Pain, and s/p back surgery x2 days.  Comparison Study: 08/11/2017- negative lower extremity venous duplex Performing Technologist: Maudry Mayhew MHA, RDMS, RVT, RDCS  Examination Guidelines: A complete evaluation includes B-mode imaging, spectral Doppler, color Doppler, and power Doppler as needed of all accessible portions of each vessel. Bilateral testing is considered an integral part of a complete examination. Limited examinations for reoccurring indications may be performed as noted. The reflux portion of the exam is performed with the patient in reverse Trendelenburg.   +---------+---------------+---------+-----------+----------+--------------+ RIGHT    CompressibilityPhasicitySpontaneityPropertiesThrombus Aging +---------+---------------+---------+-----------+----------+--------------+ CFV      Full           Yes      Yes                                 +---------+---------------+---------+-----------+----------+--------------+ SFJ      Full                                                        +---------+---------------+---------+-----------+----------+--------------+ FV Prox  Full                                                        +---------+---------------+---------+-----------+----------+--------------+ FV Mid   Full                                                        +---------+---------------+---------+-----------+----------+--------------+ FV DistalFull                                                        +---------+---------------+---------+-----------+----------+--------------+ PFV      Full                                                        +---------+---------------+---------+-----------+----------+--------------+  POP      Full           Yes      Yes                                 +---------+---------------+---------+-----------+----------+--------------+ PTV      Full                                                        +---------+---------------+---------+-----------+----------+--------------+ PERO     Full                                                        +---------+---------------+---------+-----------+----------+--------------+     Summary: RIGHT: - There is no evidence of deep vein thrombosis in the lower extremity.  - No cystic structure found in the popliteal fossa.   *See table(s) above for measurements and observations.    Preliminary     Procedures Procedures (including critical care time)  Medications Ordered in ED Medications  oxyCODONE (Oxy IR/ROXICODONE)  immediate release tablet 10 mg (10 mg Oral Given 01/13/20 1843)    ED Course  I have reviewed the triage vital signs and the nursing notes.  Pertinent labs & imaging results that were available during my care of the patient were reviewed by me and considered in my medical decision making (see chart for details).    MDM Rules/Calculators/A&P                      BP 140/84   Pulse 78   Temp 98.2 F (36.8 C)   Resp 18   Ht 5\' 11"  (1.803 m)   Wt 89.8 kg   SpO2 99%   BMI 27.62 kg/m   Final Clinical Impression(s) / ED Diagnoses Final diagnoses:  Right calf pain    Rx / DC Orders ED Discharge Orders    None     6:28 PM R calf pain 3 days post back surgery.  Will obtain venous doppler study to r/o DVT.  No evidence of infection, no compartment syndrome, no bony tenderness. No CP or SOB to suggest PE.   8:47 PM Fortunately vascular US of RLE is without evidence of DVT.  Labs are reassuring.  Encourage pt to f/u with PCP for further care.    Domenic Moras, PA-C 01/13/20 2049    Isla Pence, MD 01/13/20 2121

## 2020-01-13 NOTE — Discharge Summary (Signed)
Discharge Summary  Date of Admission: 01/11/2020  Date of Discharge: 01/13/20  Attending Physician: Emelda Brothers, MD  Hospital Course: Patient presented to the ED with RLE weakness and severe pain. An MRI showed a large far lateral disc herniation at L4-5. He was taken to the OR for a minimally invasive far lateral L4-5 microdiscectomy. Post-operatively, his weakness improved and his pain improved. He did have some residual pain in his right hip that was consistent with right hip bursitis versus intertrochanteric arthritis. His hospital course was uncomplicated and the patient was discharged home on 01/13/2020. He will follow up in clinic with me in 2 weeks.  Neurologic exam at discharge:  AOx3, PERRL, EOMI, FS, TM Strength 5/5 x4, SILTx4  Discharge diagnosis: Lumbar radiculopathy  Judith Part, MD 01/13/20 7:40 AM

## 2020-01-13 NOTE — Care Management Obs Status (Signed)
[place] NOTIFICATION   Patient Details  Name: [name] Glymph Sr. MRN: [id number] Date of Birth: 1961/01/16   Medicare Observation Status Notification Given:       Carles Collet, RN 01/13/2020, 8:16 AM

## 2020-01-13 NOTE — Progress Notes (Signed)
Patient is discharged from room 3C02 at this time. Alert and in stable condition. IV site d/c'd and instructions read to patient and spouse with understanding verbalized. Left unit via wheelchair with all belongings at side 

## 2020-01-13 NOTE — Progress Notes (Signed)
Neurosurgery Service Progress Note  Subjective: No acute events overnight, stable improved back pain, hip still bothering him, no distal pain  Objective: Vitals:   01/12/20 1211 01/12/20 1916 01/12/20 2305 01/13/20 0524  BP: 104/68 109/71 115/71 100/68  Pulse: 80 76 91 75  Resp: 18 18 18 20   Temp: 98.4 F (36.9 C) 98.2 F (36.8 C) 98.6 F (37 C) 98.2 F (36.8 C)  TempSrc: Oral Oral Oral Oral  SpO2: 97% 97% 97% 95%  Weight:      Height:       Temp (24hrs), Avg:98.4 F (36.9 C), Min:98.2 F (36.8 C), Max:98.6 F (37 C)  CBC Latest Ref Rng & Units 01/11/2020 11/13/2019 11/12/2019  WBC 4.0 - 10.5 K/uL 7.6 9.6 10.2  Hemoglobin 13.0 - 17.0 g/dL 12.3(L) 11.2(L) 12.1(L)  Hematocrit 39.0 - 52.0 % 38.9(L) 35.2(L) 37.6(L)  Platelets 150 - 400 K/uL 285 258 274   BMP Latest Ref Rng & Units 01/11/2020 11/13/2019 11/12/2019  Glucose 70 - 99 mg/dL 98 97 90  BUN 6 - 20 mg/dL 13 18 13   Creatinine 0.61 - 1.24 mg/dL 0.94 1.71(H) 1.11  Sodium 135 - 145 mmol/L 139 140 140  Potassium 3.5 - 5.1 mmol/L 4.5 4.0 3.5  Chloride 98 - 111 mmol/L 110 108 108  CO2 22 - 32 mmol/L 21(L) 23 22  Calcium 8.9 - 10.3 mg/dL 8.9 8.7(L) 8.8(L)   No intake or output data in the 24 hours ending 01/13/20 0734  Current Facility-Administered Medications:  .  0.9 %  sodium chloride infusion, 250 mL, Intravenous, Continuous, Calena Salem A, MD .  acetaminophen (TYLENOL) tablet 650 mg, 650 mg, Oral, Q4H PRN, 650 mg at 01/12/20 2224 **OR** acetaminophen (TYLENOL) suppository 650 mg, 650 mg, Rectal, Q4H PRN, Judith Part, MD .  amLODipine (NORVASC) tablet 10 mg, 10 mg, Oral, Daily, Judith Part, MD, 10 mg at 01/12/20 [id number] .  cyclobenzaprine (FLEXERIL) tablet 10 mg, 10 mg, Oral, TID PRN, Judith Part, MD, 10 mg at 01/12/20 2224 .  diclofenac (FLECTOR) 1.3 % 1 patch, 1 patch, Transdermal, BID, Judith Part, MD, 1 patch at 01/12/20 2227 .  docusate sodium (COLACE) capsule 100 mg, 100 mg, Oral,  BID, Judith Part, MD, 100 mg at 01/12/20 2224 .  famotidine (PEPCID) tablet 40 mg, 40 mg, Oral, Daily, Judith Part, MD, 40 mg at 01/12/20 0938 .  hydrochlorothiazide 10 mg/mL oral suspension 3.125 mg, 3.125 mg, Oral, Daily, Judith Part, MD, 3.125 mg at 01/12/20 0940 .  HYDROmorphone (DILAUDID) injection 1 mg, 1 mg, Intravenous, Q3H PRN, Judith Part, MD .  irbesartan (AVAPRO) tablet 300 mg, 300 mg, Oral, Daily, Judith Part, MD, 300 mg at 01/12/20 0936 .  LORazepam (ATIVAN) injection 1 mg, 1 mg, Intravenous, Q4H PRN, Judith Part, MD .  LORazepam (ATIVAN) tablet 1-2 mg, 1-2 mg, Oral, Q6H PRN, Judith Part, MD, 1 mg at 01/12/20 1940 .  menthol-cetylpyridinium (CEPACOL) lozenge 3 mg, 1 lozenge, Oral, PRN **OR** phenol (CHLORASEPTIC) mouth spray 1 spray, 1 spray, Mouth/Throat, PRN, Tyrianna Lightle A, MD .  ondansetron (ZOFRAN) tablet 4 mg, 4 mg, Oral, Q6H PRN **OR** ondansetron (ZOFRAN) injection 4 mg, 4 mg, Intravenous, Q6H PRN, Nashaun Hillmer A, MD .  oxyCODONE (Oxy IR/ROXICODONE) immediate release tablet 10 mg, 10 mg, Oral, Q4H PRN, Judith Part, MD, 10 mg at 01/12/20 2224 .  oxyCODONE (Oxy IR/ROXICODONE) immediate release tablet 15 mg, 15 mg, Oral, Q4H PRN, Kailan Laws, Joyice Faster,  MD, 15 mg at 01/12/20 0629 .  polyethylene glycol (MIRALAX / GLYCOLAX) packet 17 g, 17 g, Oral, Daily PRN, Ardra Kuznicki A, MD .  sodium chloride flush (NS) 0.9 % injection 3 mL, 3 mL, Intravenous, Q12H, Chandi Nicklin A, MD .  sodium chloride flush (NS) 0.9 % injection 3 mL, 3 mL, Intravenous, PRN, Judith Part, MD .  traZODone (DESYREL) tablet 75 mg, 75 mg, Oral, QHS, Judith Part, MD, 75 mg at 01/12/20 2224 .  vortioxetine HBr (TRINTELLIX) tablet 20 mg, 20 mg, Oral, Daily, Tadeusz Stahl, Joyice Faster, MD, 20 mg at 01/12/20 [id number]   Physical Exam: AOx3, PERRL, EOMI, FS, Strength 5/5 x4, SILTx4 Incision c/d/i  Assessment & Plan: 59 y.o. man s/p  R L4-5 far lateral MIS discectomy, preop weakness resolved post-op, still with some mild residual R hip pain.  -discharge home today, will let us know if the hip still bothers him after the weekend, will try an empiric bursa injection  Judith Part  01/13/20 7:34 AM

## 2020-01-19 DIAGNOSIS — H5203 Hypermetropia, bilateral: Secondary | ICD-10-CM | POA: Diagnosis not present

## 2020-01-19 DIAGNOSIS — H524 Presbyopia: Secondary | ICD-10-CM | POA: Diagnosis not present

## 2020-01-19 DIAGNOSIS — H52223 Regular astigmatism, bilateral: Secondary | ICD-10-CM | POA: Diagnosis not present

## 2020-01-21 ENCOUNTER — Ambulatory Visit: Payer: Self-pay

## 2020-01-23 ENCOUNTER — Other Ambulatory Visit: Payer: Self-pay

## 2020-01-23 ENCOUNTER — Ambulatory Visit (INDEPENDENT_AMBULATORY_CARE_PROVIDER_SITE_OTHER): Payer: Medicare HMO | Admitting: Psychiatry

## 2020-01-23 ENCOUNTER — Encounter (HOSPITAL_COMMUNITY): Payer: Self-pay | Admitting: Psychiatry

## 2020-01-23 DIAGNOSIS — F410 Panic disorder [episodic paroxysmal anxiety] without agoraphobia: Secondary | ICD-10-CM | POA: Diagnosis not present

## 2020-01-23 DIAGNOSIS — F411 Generalized anxiety disorder: Secondary | ICD-10-CM | POA: Diagnosis not present

## 2020-01-23 NOTE — Progress Notes (Signed)
Virtual Visit via Telephone Note  I connected with [name] Hocker Sr. on 01/23/20 at  9:00 AM EDT by telephone and verified that I am speaking with the correct person using two identifiers.   I discussed the limitations, risks, security and privacy concerns of performing an evaluation and management service by telephone and the availability of in person appointments. I also discussed with the patient that there may be a patient responsible charge related to this service. The patient expressed understanding and agreed to proceed.   History of Present Illness: Patient was evaluated by phone session.  He recently had procedure for his back.  Patient told he had weakness in excruciating pain in his back and found that he had disc herniation.  He had a emergency procedure and now he is feeling somewhat better but is still have numbness, neck pain, headache and back pain.  He is taking pain medication.  We tried him on gabapentin on the last visit but he reported having hot flashes and did not agree with his body.  He admitted not able to pick up the samples and now taking Trintellix only 10 mg.  However he cut down his Ativan and taking only 1 mg twice a day.  His trazodone and Ativan is prescribed by his PCP.  Denies any crying spells or any feeling of hopelessness.  He admitted anxiety and panic attacks because recently due to pain.  We discussed not to change his medication since he is going through physical pain and I recommended to have his pain under control first before we try a different medication.    Past psychiatric history; H/Oanxiety since early 20s. 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 No h/omania, psychosis, suicidal attempt, inpatient treatment. Seen and managed byPCPsince early 54s. Tried Remeron, hydroxyzine, gabapentin, Paxil, Prozac, Zoloft and Lamictal. Stopped due to tremors or sexual side effects.   Recent Results (from the past 2160 hour(s))   Lipase, blood     Status: None   Collection Time: 11/12/19  9:51 AM  Result Value Ref Range   Lipase 20 11 - 51 U/L    Comment: Performed at [facility], [place] [address], [place], [place] [address]  Comprehensive metabolic panel     Status: Abnormal   Collection Time: 11/12/19  9:51 AM  Result Value Ref Range   Sodium 140 135 - 145 mmol/L   Potassium 3.5 3.5 - 5.1 mmol/L   Chloride 108 98 - 111 mmol/L   CO2 22 22 - 32 mmol/L   Glucose, Bld 90 70 - 99 mg/dL   BUN 13 6 - 20 mg/dL   Creatinine, Ser 1.11 0.61 - 1.24 mg/dL   Calcium 8.8 (L) 8.9 - 10.3 mg/dL   Total Protein 6.4 (L) 6.5 - 8.1 g/dL   Albumin 3.7 3.5 - 5.0 g/dL   AST 28 15 - 41 U/L   ALT 29 0 - 44 U/L   Alkaline Phosphatase 72 38 - 126 U/L   Total Bilirubin 0.7 0.3 - 1.2 mg/dL   GFR calc non Af Amer >60 >60 mL/min   GFR calc Af Amer >60 >60 mL/min   Anion gap 10 5 - 15    Comment: Performed at [facility], [place] [address] [place], [place] [address]  CBC with Differential     Status: Abnormal   Collection Time: 11/12/19  9:58 AM  Result Value Ref Range   WBC 10.2 4.0 - 10.5  K/uL   RBC 4.28 4.22 - 5.81 MIL/uL   Hemoglobin 12.1 (L) 13.0 - 17.0 g/dL   HCT 37.6 (L) 39.0 - 52.0 %   MCV 87.9 80.0 - 100.0 fL   MCH 28.3 26.0 - 34.0 pg   MCHC 32.2 30.0 - 36.0 g/dL   RDW 14.6 11.5 - 15.5 %   Platelets 274 150 - 400 K/uL   nRBC 0.0 0.0 - 0.2 %   Neutrophils Relative % 63 %   Neutro Abs 6.3 1.7 - 7.7 K/uL   Lymphocytes Relative 25 %   Lymphs Abs 2.6 0.7 - 4.0 K/uL   Monocytes Relative 10 %   Monocytes Absolute 1.0 0.1 - 1.0 K/uL   Eosinophils Relative 2 %   Eosinophils Absolute 0.2 0.0 - 0.5 K/uL   Basophils Relative 0 %   Basophils Absolute 0.0 0.0 - 0.1 K/uL   Immature Granulocytes 0 %   Abs Immature Granulocytes 0.04 0.00 - 0.07 K/uL    Comment: Performed at [facility], [place] [address] [place], [place] [address]  Urinalysis, Routine w reflex microscopic      Status: Abnormal   Collection Time: 11/12/19 11:19 AM  Result Value Ref Range   Color, Urine YELLOW YELLOW   APPearance CLEAR CLEAR   Specific Gravity, Urine 1.017 1.005 - 1.030   pH 5.0 5.0 - 8.0   Glucose, UA NEGATIVE NEGATIVE mg/dL   Hgb urine dipstick LARGE (A) NEGATIVE   Bilirubin Urine NEGATIVE NEGATIVE   Ketones, ur NEGATIVE NEGATIVE mg/dL   Protein, ur NEGATIVE NEGATIVE mg/dL   Nitrite NEGATIVE NEGATIVE   Leukocytes,Ua NEGATIVE NEGATIVE   RBC / HPF >50 (H) 0 - 5 RBC/hpf   WBC, UA 0-5 0 - 5 WBC/hpf   Bacteria, UA NONE SEEN NONE SEEN   Mucus PRESENT     Comment: Performed at [facility], [place] [address] [place], [place] [address]  Urine culture     Status: None   Collection Time: 11/12/19 11:19 AM   Specimen: Urine, Random  Result Value Ref Range   Specimen Description      URINE, RANDOM Performed at [place] [address], [place], [place] [address]    Special Requests      NONE Performed at [facility], [place] [address] [place], [place] [address]    Culture      NO GROWTH Performed at [facility], [place] [address], [place], [place] [address]    Report Status 11/14/2019 FINAL   Basic metabolic panel     Status: Abnormal   Collection Time: 11/13/19  9:57 AM  Result Value Ref Range   Sodium 140 135 - 145 mmol/L   Potassium 4.0 3.5 - 5.1 mmol/L   Chloride 108 98 - 111 mmol/L   CO2 23 22 - 32 mmol/L   Glucose, Bld 97 70 - 99 mg/dL   BUN 18 6 - 20 mg/dL   Creatinine, Ser 1.71 (H) 0.61 - 1.24 mg/dL   Calcium 8.7 (L) 8.9 - 10.3 mg/dL   GFR calc non Af Amer 43 (L) >60 mL/min   GFR calc Af Amer 50 (L) >60 mL/min   Anion gap 9 5 - 15    Comment: Performed at [place] [address], [place], [place] [address]  CBC with Differential     Status: Abnormal   Collection Time: 11/13/19  9:57 AM  Result Value Ref Range   WBC  9.6 4.0 - 10.5 K/uL   RBC 3.99 (L) 4.22 - 5.81 MIL/uL    Hemoglobin 11.2 (L) 13.0 - 17.0 g/dL   HCT 35.2 (L) 39.0 - 52.0 %   MCV 88.2 80.0 - 100.0 fL   MCH 28.1 26.0 - 34.0 pg   MCHC 31.8 30.0 - 36.0 g/dL   RDW 14.5 11.5 - 15.5 %   Platelets 258 150 - 400 K/uL   nRBC 0.0 0.0 - 0.2 %   Neutrophils Relative % 61 %   Neutro Abs 5.8 1.7 - 7.7 K/uL   Lymphocytes Relative 27 %   Lymphs Abs 2.6 0.7 - 4.0 K/uL   Monocytes Relative 10 %   Monocytes Absolute 1.0 0.1 - 1.0 K/uL   Eosinophils Relative 2 %   Eosinophils Absolute 0.2 0.0 - 0.5 K/uL   Basophils Relative 0 %   Basophils Absolute 0.0 0.0 - 0.1 K/uL   Immature Granulocytes 0 %   Abs Immature Granulocytes 0.03 0.00 - 0.07 K/uL    Comment: Performed at [facility], [place] [address], [place], [place] [address]  Urinalysis, Routine w reflex microscopic     Status: Abnormal   Collection Time: 11/13/19 10:07 AM  Result Value Ref Range   Color, Urine YELLOW YELLOW   APPearance CLEAR CLEAR   Specific Gravity, Urine 1.010 1.005 - 1.030   pH 5.0 5.0 - 8.0   Glucose, UA NEGATIVE NEGATIVE mg/dL   Hgb urine dipstick SMALL (A) NEGATIVE   Bilirubin Urine NEGATIVE NEGATIVE   Ketones, ur NEGATIVE NEGATIVE mg/dL   Protein, ur NEGATIVE NEGATIVE mg/dL   Nitrite NEGATIVE NEGATIVE   Leukocytes,Ua NEGATIVE NEGATIVE   RBC / HPF 0-5 0 - 5 RBC/hpf   WBC, UA 0-5 0 - 5 WBC/hpf   Bacteria, UA NONE SEEN NONE SEEN   Mucus PRESENT     Comment: Performed at [facility], [place] [address], [place], [place] [address]  Respiratory Panel by RT PCR (Flu A&B, Covid) - Nasopharyngeal Swab     Status: None   Collection Time: 01/11/20 12:45 PM   Specimen: Nasopharyngeal Swab  Result Value Ref Range   SARS Coronavirus 2 by RT PCR NEGATIVE NEGATIVE    Comment: (NOTE) SARS-CoV-2 target nucleic acids are NOT DETECTED. The SARS-CoV-2 RNA is generally detectable in upper respiratoy specimens during the acute phase of infection. The lowest concentration of SARS-CoV-2 viral copies this  assay can detect is 131 copies/mL. A negative result does not preclude SARS-Cov-2 infection and should not be used as the sole basis for treatment or other patient management decisions. A negative result may occur with  improper specimen collection/handling, submission of specimen other than nasopharyngeal swab, presence of viral mutation(s) within the areas targeted by this assay, and inadequate number of viral copies (<131 copies/mL). A negative result must be combined with clinical observations, patient history, and epidemiological information. The expected result is Negative. Fact Sheet for Patients:  PinkCheek.be Fact Sheet for Healthcare Providers:  GravelBags.it This test is not yet ap proved or cleared by the [place] FDA and  has been authorized for detection and/or diagnosis of SARS-CoV-2 by FDA under an Emergency Use Authorization (EUA). This EUA will remain  in effect (meaning this test can be used) for the duration of the COVID-19 declaration under Section 564(b)(1) of the Act, 21 U.S.C. section 360bbb-3(b)(1), unless the authorization is terminated or revoked sooner.    Influenza A by PCR NEGATIVE NEGATIVE   Influenza B by  PCR NEGATIVE NEGATIVE    Comment: (NOTE) The Xpert Xpress SARS-CoV-2/FLU/RSV assay is intended as an aid in  the diagnosis of influenza from Nasopharyngeal swab specimens and  should not be used as a sole basis for treatment. Nasal washings and  aspirates are unacceptable for Xpert Xpress SARS-CoV-2/FLU/RSV  testing. Fact Sheet for Patients: PinkCheek.be Fact Sheet for Healthcare Providers: GravelBags.it This test is not yet approved or cleared by the [place] FDA and  has been authorized for detection and/or diagnosis of SARS-CoV-2 by  FDA under an Emergency Use Authorization (EUA). This EUA will remain  in effect (meaning  this test can be used) for the duration of the  Covid-19 declaration under Section 564(b)(1) of the Act, 21  U.S.C. section 360bbb-3(b)(1), unless the authorization is  terminated or revoked. Performed at [facility], [place] [address] [place], [place] [id number]   Basic metabolic panel     Status: Abnormal   Collection Time: 01/11/20 12:52 PM  Result Value Ref Range   Sodium 139 135 - 145 mmol/L   Potassium 4.5 3.5 - 5.1 mmol/L   Chloride 110 98 - 111 mmol/L   CO2 21 (L) 22 - 32 mmol/L   Glucose, Bld 98 70 - 99 mg/dL    Comment: Glucose reference range applies only to samples taken after fasting for at least 8 hours.   BUN 13 6 - 20 mg/dL   Creatinine, Ser 0.94 0.61 - 1.24 mg/dL   Calcium 8.9 8.9 - 10.3 mg/dL   GFR calc non Af Amer >60 >60 mL/min   GFR calc Af Amer >60 >60 mL/min   Anion gap 8 5 - 15    Comment: Performed at [facility], [place] [address], [place], [place] [address]  CBC with Differential     Status: Abnormal   Collection Time: 01/11/20 12:52 PM  Result Value Ref Range   WBC 7.6 4.0 - 10.5 K/uL   RBC 4.43 4.22 - 5.81 MIL/uL   Hemoglobin 12.3 (L) 13.0 - 17.0 g/dL   HCT 38.9 (L) 39.0 - 52.0 %   MCV 87.8 80.0 - 100.0 fL   MCH 27.8 26.0 - 34.0 pg   MCHC 31.6 30.0 - 36.0 g/dL   RDW 14.3 11.5 - 15.5 %   Platelets 285 150 - 400 K/uL   nRBC 0.0 0.0 - 0.2 %   Neutrophils Relative % 72 %   Neutro Abs 5.5 1.7 - 7.7 K/uL   Lymphocytes Relative 23 %   Lymphs Abs 1.7 0.7 - 4.0 K/uL   Monocytes Relative 3 %   Monocytes Absolute 0.2 0.1 - 1.0 K/uL   Eosinophils Relative 1 %   Eosinophils Absolute 0.1 0.0 - 0.5 K/uL   Basophils Relative 0 %   Basophils Absolute 0.0 0.0 - 0.1 K/uL   Immature Granulocytes 1 %   Abs Immature Granulocytes 0.04 0.00 - 0.07 K/uL    Comment: Performed at [facility], [place] [address], [place], [place] [address]  I-stat chem 8, ED (not at [facility] or [facility])     Status: Abnormal   Collection  Time: 01/13/20  6:54 PM  Result Value Ref Range   Sodium 141 135 - 145 mmol/L   Potassium 4.3 3.5 - 5.1 mmol/L   Chloride 106 98 - 111 mmol/L   BUN 15 6 - 20 mg/dL   Creatinine, Ser 0.90 0.61 - 1.24 mg/dL   Glucose, Bld 94 70 - 99 mg/dL  Comment: Glucose reference range applies only to samples taken after fasting for at least 8 hours.   Calcium, Ion 1.20 1.15 - 1.40 mmol/L   TCO2 26 22 - 32 mmol/L   Hemoglobin 11.6 (L) 13.0 - 17.0 g/dL   HCT 34.0 (L) 39.0 - 52.0 %     Psychiatric Specialty Exam: Physical Exam  Review of Systems  Musculoskeletal: Positive for back pain and neck pain.  Neurological: Positive for numbness and headaches.    There were no vitals taken for this visit.There is no height or weight on file to calculate BMI.  General Appearance: NA  Eye Contact:  NA  Speech:  Clear and Coherent  Volume:  Normal  Mood:  Angry and Dysphoric  Affect:  NA  Thought Process:  Goal Directed  Orientation:  Full (Time, Place, and Person)  Thought Content:  WDL  Suicidal Thoughts:  No  Homicidal Thoughts:  No  Memory:  Immediate;   Good Recent;   Good Remote;   Good  Judgement:  Fair  Insight:  Good  Psychomotor Activity:  NA  Concentration:  Concentration: Fair and Attention Span: Fair  Recall:  Good  Fund of Knowledge:  Good  Language:  Good  Akathisia:  No  Handed:  Right  AIMS (if indicated):     Assets:  Communication Skills Desire for Improvement Housing Resilience  ADL's:  Intact  Cognition:  WNL  Sleep:   fair      Assessment and Plan: Generalized anxiety disorder.  Panic attacks.  We will discontinue gabapentin since patient recall having hot flashes and side effects.  Patient recently had procedures for his back pain and still have a lot of pain, headaches and tingling.  We discussed not to change medication since I want him to address his pain management.  He agreed with the plan.  We will discontinue gabapentin.  Encouraged to continue  Trintellix 20 mg daily.  We are giving him samples since he cannot afford.  He does not want to change medication.  He has cut down his lorazepam and now he is taking 1 mg twice a day and trazodone 75 mg at bedtime which is prescribed by his PCP.  I reviewed his blood work.  His creatinine is better and dropped from 1.71 to normal.  Recommended to call us back if he has any question or any concern.  Follow-up in 2 months.  Follow Up Instructions:    I discussed the assessment and treatment plan with the patient. The patient was provided an opportunity to ask questions and all were answered. The patient agreed with the plan and demonstrated an understanding of the instructions.   The patient was advised to call back or seek an in-person evaluation if the symptoms worsen or if the condition fails to improve as anticipated.  I provided 20 minutes of non-face-to-face time during this encounter.   [name], MD

## 2020-01-24 ENCOUNTER — Other Ambulatory Visit (HOSPITAL_COMMUNITY): Payer: Self-pay | Admitting: *Deleted

## 2020-01-24 NOTE — Progress Notes (Signed)
Pt in on 3/30 for samples of Trintellix 20mg . One month supply given.

## 2020-01-27 ENCOUNTER — Other Ambulatory Visit (HOSPITAL_COMMUNITY): Payer: Self-pay | Admitting: *Deleted

## 2020-01-27 DIAGNOSIS — F411 Generalized anxiety disorder: Secondary | ICD-10-CM

## 2020-01-27 DIAGNOSIS — F410 Panic disorder [episodic paroxysmal anxiety] without agoraphobia: Secondary | ICD-10-CM

## 2020-01-27 MED ORDER — VORTIOXETINE HBR 20 MG PO TABS: 20.0000 mg | ORAL_TABLET | Freq: Every day | ORAL | 0 refills | Status: DC

## 2020-01-31 ENCOUNTER — Ambulatory Visit: Payer: Medicare HMO | Attending: Internal Medicine

## 2020-01-31 DIAGNOSIS — Z23 Encounter for immunization: Secondary | ICD-10-CM

## 2020-01-31 NOTE — Progress Notes (Signed)
   [id number]  Name:  [name] Szafran Sr.    MRN: [id number] DOB: 11-02-1960  01/31/2020  [name] [name] was observed post Covid-19 immunization for 15 minutes without incident. He was provided with Vaccine Information Sheet and instruction to access the V-Safe system.   [name] [name] was instructed to call 911 with any severe reactions post vaccine: Marland Kitchen Difficulty breathing  . Swelling of face and throat  . A fast heartbeat  . A bad rash all over body  . Dizziness and weakness   Immunizations Administered    Name Date Dose VIS Date Route   Pfizer COVID-19 Vaccine 01/31/2020 12:54 PM 0.3 mL 10/07/2019 Intramuscular   Manufacturer: [place]   Lot: [id number]   [place]: [id number]

## 2020-02-09 ENCOUNTER — Telehealth (HOSPITAL_COMMUNITY): Payer: Self-pay

## 2020-02-09 DIAGNOSIS — F411 Generalized anxiety disorder: Secondary | ICD-10-CM

## 2020-02-09 DIAGNOSIS — F410 Panic disorder [episodic paroxysmal anxiety] without agoraphobia: Secondary | ICD-10-CM

## 2020-02-09 MED ORDER — VORTIOXETINE HBR 20 MG PO TABS: 20.0000 mg | ORAL_TABLET | Freq: Every day | ORAL | 0 refills | Status: DC

## 2020-02-09 NOTE — Telephone Encounter (Signed)
Pt called requesting more samples of trintellix. States he only has 3 days left. Per records, pt was given 30 day supply on 3/30. Pt was informed of this and states he was provided with 5mg  tabs instead of the normal 10mg  tabs and therefore is out.

## 2020-02-09 NOTE — Telephone Encounter (Signed)
Medication Samples have been provided to the patient.  Drug name: Trintellix 10mg  Qty: 8 boxes (7 pills per box) LOT: [id number] Exp. Date: 03/27/2022  The patient has been instructed regarding the correct time, dose, and frequency of taking this medication, including desired effects and most common side effects.

## 2020-03-19 ENCOUNTER — Other Ambulatory Visit: Payer: Self-pay

## 2020-03-19 ENCOUNTER — Telehealth (INDEPENDENT_AMBULATORY_CARE_PROVIDER_SITE_OTHER): Payer: Medicare HMO | Admitting: Psychiatry

## 2020-03-19 ENCOUNTER — Encounter (HOSPITAL_COMMUNITY): Payer: Self-pay | Admitting: Psychiatry

## 2020-03-19 DIAGNOSIS — F410 Panic disorder [episodic paroxysmal anxiety] without agoraphobia: Secondary | ICD-10-CM

## 2020-03-19 DIAGNOSIS — F411 Generalized anxiety disorder: Secondary | ICD-10-CM

## 2020-03-19 MED ORDER — VORTIOXETINE HBR 20 MG PO TABS: 20.0000 mg | ORAL_TABLET | Freq: Every day | ORAL | 0 refills | Status: DC

## 2020-03-19 NOTE — Progress Notes (Signed)
Virtual Visit via Telephone Note  I connected with [name] Allgire Sr. on 03/19/20 at  9:00 AM EDT by telephone and verified that I am speaking with the correct person using two identifiers.   I discussed the limitations, risks, security and privacy concerns of performing an evaluation and management service by telephone and the availability of in person appointments. I also discussed with the patient that there may be a patient responsible charge related to this service. The patient expressed understanding and agreed to proceed.   History of Present Illness: Patient is evaluated by phone session.  He is taking Trintellix 20 mg samples.  He admitted feeling better and started to cut down his lorazepam.  He is taking 1.25 mg Ativan.  He used to take 4 mg of slowly and gradually he is reducing the dose.  He also cut down his Flexeril and only taking 5 mg.  He does not take narcotic pain medication at night since he sleep better.  He also takes trazodone.  His goal is to wean himself off from the lorazepam but at least on the milligram dose.  He admitted chronic back pain and neck pain.  He is trying to build muscle and start walking to help his chronic pain.  He admitted some time get frustrated but denies any mania, psychosis, hallucination or any suicidal thoughts.  His energy level is slowly improving.  His anxiety is under control.  He denies any major panic attack.  He is no longer taking gabapentin which causes hot flashes.  Past psychiatric history; H/Oanxiety since early 62s. 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 No h/omania, psychosis, suicidal attempt, inpatient treatment. Seen and managed byPCPsince early 74s. TriedRemeron, hydroxyzine, gabapentin, Paxil, Prozac, Zoloft and Lamictal. Stopped due to tremors or sexual side effects.  Psychiatric Specialty Exam: Physical Exam  Review of Systems  There were no vitals taken for this visit.There is no height or  weight on file to calculate BMI.  General Appearance: NA  Eye Contact:  NA  Speech:  Normal Rate  Volume:  Normal  Mood:  Depressed  Affect:  NA  Thought Process:  Goal Directed  Orientation:  Full (Time, Place, and Person)  Thought Content:  Rumination  Suicidal Thoughts:  No  Homicidal Thoughts:  No  Memory:  Immediate;   Good Recent;   Good Remote;   Good  Judgement:  Fair  Insight:  Present  Psychomotor Activity:  NA  Concentration:  Concentration: Fair and Attention Span: Fair  Recall:  Good  Fund of Knowledge:  Good  Language:  Good  Akathisia:  No  Handed:  Right  AIMS (if indicated):     Assets:  Communication Skills Desire for Improvement Housing Resilience  ADL's:  Intact  Cognition:  WNL  Sleep:   much better. 7-8 hrs      Assessment and Plan: Generalized anxiety disorder.  Panic attacks.  Patient is taking Trintellix 20 mg samples since he cannot afford brand name medication.  He has cut down his Ativan and now taking 1.25 mg Ativan and trazodone 75 mg at bedtime prescribed by PCP.  He has chronic back and neck pain and he started walking and trying to build a muscle to help his chronic pain.  I discussed medication side effects.  He does not want to change since Trintellix helping him.  We will continue to get samples from our office.  I recommend to call us back with his  any questions or any concerns.  Follow-up in 3 months.  Follow Up Instructions:    I discussed the assessment and treatment plan with the patient. The patient was provided an opportunity to ask questions and all were answered. The patient agreed with the plan and demonstrated an understanding of the instructions.   The patient was advised to call back or seek an in-person evaluation if the symptoms worsen or if the condition fails to improve as anticipated.  I provided 15 minutes of non-face-to-face time during this encounter.   [name], MD

## 2020-03-22 ENCOUNTER — Encounter: Payer: Medicare HMO | Admitting: Psychology

## 2020-03-27 ENCOUNTER — Other Ambulatory Visit (HOSPITAL_COMMUNITY): Payer: Self-pay | Admitting: *Deleted

## 2020-03-27 DIAGNOSIS — F411 Generalized anxiety disorder: Secondary | ICD-10-CM

## 2020-03-27 DIAGNOSIS — F410 Panic disorder [episodic paroxysmal anxiety] without agoraphobia: Secondary | ICD-10-CM

## 2020-03-27 MED ORDER — VORTIOXETINE HBR 20 MG PO TABS: 20.0000 mg | ORAL_TABLET | Freq: Every day | ORAL | 0 refills | Status: DC

## 2020-04-04 DIAGNOSIS — M4722 Other spondylosis with radiculopathy, cervical region: Secondary | ICD-10-CM | POA: Diagnosis not present

## 2020-04-04 DIAGNOSIS — M791 Myalgia, unspecified site: Secondary | ICD-10-CM | POA: Diagnosis not present

## 2020-04-04 DIAGNOSIS — M545 Low back pain: Secondary | ICD-10-CM | POA: Diagnosis not present

## 2020-04-05 DIAGNOSIS — F411 Generalized anxiety disorder: Secondary | ICD-10-CM | POA: Diagnosis not present

## 2020-04-05 DIAGNOSIS — F410 Panic disorder [episodic paroxysmal anxiety] without agoraphobia: Secondary | ICD-10-CM | POA: Diagnosis not present

## 2020-04-05 DIAGNOSIS — F321 Major depressive disorder, single episode, moderate: Secondary | ICD-10-CM | POA: Diagnosis not present

## 2020-04-05 DIAGNOSIS — I10 Essential (primary) hypertension: Secondary | ICD-10-CM | POA: Diagnosis not present

## 2020-04-05 DIAGNOSIS — F17210 Nicotine dependence, cigarettes, uncomplicated: Secondary | ICD-10-CM | POA: Diagnosis not present

## 2020-04-10 DIAGNOSIS — R293 Abnormal posture: Secondary | ICD-10-CM | POA: Diagnosis not present

## 2020-04-10 DIAGNOSIS — M2569 Stiffness of other specified joint, not elsewhere classified: Secondary | ICD-10-CM | POA: Diagnosis not present

## 2020-04-10 DIAGNOSIS — M545 Low back pain: Secondary | ICD-10-CM | POA: Diagnosis not present

## 2020-04-10 DIAGNOSIS — M6258 Muscle wasting and atrophy, not elsewhere classified, other site: Secondary | ICD-10-CM | POA: Diagnosis not present

## 2020-04-17 DIAGNOSIS — R293 Abnormal posture: Secondary | ICD-10-CM | POA: Diagnosis not present

## 2020-04-17 DIAGNOSIS — M6258 Muscle wasting and atrophy, not elsewhere classified, other site: Secondary | ICD-10-CM | POA: Diagnosis not present

## 2020-04-17 DIAGNOSIS — M2569 Stiffness of other specified joint, not elsewhere classified: Secondary | ICD-10-CM | POA: Diagnosis not present

## 2020-04-17 DIAGNOSIS — M545 Low back pain: Secondary | ICD-10-CM | POA: Diagnosis not present

## 2020-05-01 DIAGNOSIS — M545 Low back pain: Secondary | ICD-10-CM | POA: Diagnosis not present

## 2020-05-01 DIAGNOSIS — M6258 Muscle wasting and atrophy, not elsewhere classified, other site: Secondary | ICD-10-CM | POA: Diagnosis not present

## 2020-05-01 DIAGNOSIS — R293 Abnormal posture: Secondary | ICD-10-CM | POA: Diagnosis not present

## 2020-05-01 DIAGNOSIS — M2569 Stiffness of other specified joint, not elsewhere classified: Secondary | ICD-10-CM | POA: Diagnosis not present

## 2020-05-10 ENCOUNTER — Encounter: Payer: Medicare HMO | Admitting: Psychology

## 2020-05-16 DIAGNOSIS — M2569 Stiffness of other specified joint, not elsewhere classified: Secondary | ICD-10-CM | POA: Diagnosis not present

## 2020-05-16 DIAGNOSIS — M6258 Muscle wasting and atrophy, not elsewhere classified, other site: Secondary | ICD-10-CM | POA: Diagnosis not present

## 2020-05-16 DIAGNOSIS — R293 Abnormal posture: Secondary | ICD-10-CM | POA: Diagnosis not present

## 2020-05-16 DIAGNOSIS — M545 Low back pain: Secondary | ICD-10-CM | POA: Diagnosis not present

## 2020-05-21 ENCOUNTER — Other Ambulatory Visit: Payer: Self-pay

## 2020-05-21 ENCOUNTER — Encounter (HOSPITAL_COMMUNITY): Payer: Self-pay | Admitting: Psychiatry

## 2020-05-21 ENCOUNTER — Telehealth (INDEPENDENT_AMBULATORY_CARE_PROVIDER_SITE_OTHER): Payer: Medicare HMO | Admitting: Psychiatry

## 2020-05-21 DIAGNOSIS — F411 Generalized anxiety disorder: Secondary | ICD-10-CM

## 2020-05-21 DIAGNOSIS — F410 Panic disorder [episodic paroxysmal anxiety] without agoraphobia: Secondary | ICD-10-CM

## 2020-05-21 MED ORDER — VORTIOXETINE HBR 20 MG PO TABS: 20.0000 mg | ORAL_TABLET | Freq: Every day | ORAL | 0 refills | Status: DC

## 2020-05-21 NOTE — Progress Notes (Signed)
Virtual Visit via Telephone Note  I connected with [name] Leopard Sr. on 05/21/20 at  9:00 AM EDT by telephone and verified that I am speaking with the correct person using two identifiers.  Location: Patient: home Provider: home office   I discussed the limitations, risks, security and privacy concerns of performing an evaluation and management service by telephone and the availability of in person appointments. I also discussed with the patient that there may be a patient responsible charge related to this service. The patient expressed understanding and agreed to proceed.   History of Present Illness: Patient is evaluated by phone session.  He is taking Ativan 0.5 mg in the morning and 0.5 mg at bedtime.  He is taking this regimen for past 3 weeks but he noticed that his anxiety is coming back.  He is having headaches and he also checked his blood pressure but it was normal.  He does not know why he is anxious but he feels his depression is a stable.  Does not have any crying spells and he is more motivated.  He admitted not able to walk as much because he is dizzy lately.  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  He is taking pain medication.  He lives with his wife who is supportive.  She denies any mania, psychosis or any hallucination.  Patient denies any major panic attack but reported chronic anxiety and nervousness.  His energy level is okay.  His appetite is okay.  Denies drinking or using any illegal substances.   Past psychiatric history; H/Oanxiety since early 19s. 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 No h/omania, psychosis, suicidal attempt, inpatient treatment. Seen and managed byPCPsince early 31s. TriedRemeron, hydroxyzine, gabapentin, Paxil, Prozac, Zoloft and Lamictal. Stopped due to tremors or sexual side effects.    Psychiatric Specialty Exam: Physical Exam  Review of Systems  Weight 200 lb  (90.7 kg).There is no height or weight on file to calculate BMI.  General Appearance: NA  Eye Contact:  NA  Speech:  Clear and Coherent  Volume:  Normal  Mood:  Anxious  Affect:  NA  Thought Process:  Goal Directed  Orientation:  Full (Time, Place, and Person)  Thought Content:  Logical  Suicidal Thoughts:  No  Homicidal Thoughts:  No  Memory:  Immediate;   Good Recent;   Good Remote;   Good  Judgement:  Intact  Insight:  Good  Psychomotor Activity:  NA  Concentration:  Concentration: Fair and Attention Span: Fair  Recall:  Good  Fund of Knowledge:  Good  Language:  Good  Akathisia:  No  Handed:  Right  AIMS (if indicated):     Assets:  Communication Skills Desire for Improvement Housing Resilience Social Support  ADL's:  Intact  Cognition:  WNL  Sleep:         Assessment and Plan: Generalized anxiety disorder.  Panic attack.  Discussed his current benzodiazepine dose.  Currently he is taking Ativan 0.5 mg twice a day.  He had cut down his lorazepam 4 mg from the past slowly and gradually.  I recommend he can take 0.25 mg if he feel very nervous and anxious but stay on Ativan 0.5 mg twice a day.  His PCP is giving him Ativan.  Discussed polypharmacy and interaction with pain medication.  He feels Trintellix helping his depression.  And he like to keep Trintellix.  He has no tremors, shakes or any EPS.  He is getting samples from our office.  Continue Trintellix 20 mg daily.  He is getting trazodone 75 mg and Ativan from PCP.  Recommended to call us back if he has any question or any concern.  Follow-up in 3 months.  Follow Up Instructions:    I discussed the assessment and treatment plan with the patient. The patient was provided an opportunity to ask questions and all were answered. The patient agreed with the plan and demonstrated an understanding of the instructions.   The patient was advised to call back or seek an in-person evaluation if the symptoms worsen or if  the condition fails to improve as anticipated.  I provided 15 minutes of non-face-to-face time during this encounter.   [name], MD

## 2020-05-28 DIAGNOSIS — M25473 Effusion, unspecified ankle: Secondary | ICD-10-CM | POA: Diagnosis not present

## 2020-05-30 DIAGNOSIS — M25473 Effusion, unspecified ankle: Secondary | ICD-10-CM | POA: Diagnosis not present

## 2020-06-25 ENCOUNTER — Other Ambulatory Visit (HOSPITAL_COMMUNITY): Payer: Self-pay | Admitting: *Deleted

## 2020-06-25 DIAGNOSIS — L249 Irritant contact dermatitis, unspecified cause: Secondary | ICD-10-CM | POA: Diagnosis not present

## 2020-06-25 DIAGNOSIS — F410 Panic disorder [episodic paroxysmal anxiety] without agoraphobia: Secondary | ICD-10-CM

## 2020-06-25 DIAGNOSIS — L738 Other specified follicular disorders: Secondary | ICD-10-CM | POA: Diagnosis not present

## 2020-06-25 DIAGNOSIS — F411 Generalized anxiety disorder: Secondary | ICD-10-CM

## 2020-06-29 ENCOUNTER — Encounter (HOSPITAL_COMMUNITY): Payer: Self-pay

## 2020-07-03 DIAGNOSIS — M5414 Radiculopathy, thoracic region: Secondary | ICD-10-CM | POA: Diagnosis not present

## 2020-07-03 DIAGNOSIS — M545 Low back pain: Secondary | ICD-10-CM | POA: Diagnosis not present

## 2020-07-03 DIAGNOSIS — M5416 Radiculopathy, lumbar region: Secondary | ICD-10-CM | POA: Diagnosis not present

## 2020-07-03 DIAGNOSIS — M791 Myalgia, unspecified site: Secondary | ICD-10-CM | POA: Diagnosis not present

## 2020-08-21 ENCOUNTER — Other Ambulatory Visit (HOSPITAL_COMMUNITY): Payer: Self-pay | Admitting: *Deleted

## 2020-08-21 ENCOUNTER — Encounter (HOSPITAL_COMMUNITY): Payer: Self-pay | Admitting: Psychiatry

## 2020-08-21 ENCOUNTER — Other Ambulatory Visit: Payer: Self-pay

## 2020-08-21 ENCOUNTER — Telehealth (INDEPENDENT_AMBULATORY_CARE_PROVIDER_SITE_OTHER): Payer: Medicare HMO | Admitting: Psychiatry

## 2020-08-21 VITALS — Wt 200.0 lb

## 2020-08-21 DIAGNOSIS — F410 Panic disorder [episodic paroxysmal anxiety] without agoraphobia: Secondary | ICD-10-CM

## 2020-08-21 DIAGNOSIS — F411 Generalized anxiety disorder: Secondary | ICD-10-CM | POA: Diagnosis not present

## 2020-08-21 MED ORDER — VORTIOXETINE HBR 10 MG PO TABS: ORAL_TABLET | ORAL | 0 refills | Status: DC

## 2020-08-21 MED ORDER — LORAZEPAM 0.5 MG PO TABS: 0.5000 mg | ORAL_TABLET | Freq: Two times a day (BID) | ORAL | 2 refills | Status: DC

## 2020-08-21 MED ORDER — VORTIOXETINE HBR 20 MG PO TABS: 20.0000 mg | ORAL_TABLET | Freq: Every day | ORAL | 0 refills | Status: DC

## 2020-08-21 NOTE — Progress Notes (Signed)
Virtual Visit via Telephone Note  I connected with [name] Nannini Sr. on 08/21/20 at  9:00 AM EDT by telephone and verified that I am speaking with the correct person using two identifiers.  Location: Patient: home Provider: home office   I discussed the limitations, risks, security and privacy concerns of performing an evaluation and management service by telephone and the availability of in person appointments. I also discussed with the patient that there may be a patient responsible charge related to this service. The patient expressed understanding and agreed to proceed.   History of Present Illness: Patient is evaluated by phone session. He is using 2 mg of Ativan prescribed by PCP to cutting down in half and then one fourth in the morning and three fourth at bedtime. He is still have some time anxiety and nervousness but overall his panic attacks are less intense and less frequent. He occasionally have dizziness and he believes because of his blood pressure medication. He is sleeping 6 to 7 hours. He denies any crying spells or any feeling of hopelessness or worthlessness. He lives with his wife who is a Marine scientist and very supportive. He is taking Trintellix samples but also trying to get disability and filling up the papers so he can get from patient's assistance program from pharmaceutical. He tried to walk more than before but sometimes he gets dizzy and he is afraid to do that. 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 His appetite is okay. His weight is stable. He has no EPS. He takes trazodone prescribed 100 mg by PCP but he takes 75 mg which helps his sleep.  Past psychiatric history; H/Oanxiety since early 16s. 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 No h/omania, psychosis, suicidal attempt, inpatient treatment. Seen and managed byPCPsince early 53s. TriedRemeron, hydroxyzine, gabapentin, Paxil, Prozac, Zoloft and Lamictal. Stopped due to  tremors or sexual side effects.  Psychiatric Specialty Exam: Physical Exam  Review of Systems  Weight 200 lb (90.7 kg).There is no height or weight on file to calculate BMI.  General Appearance: NA  Eye Contact:  NA  Speech:  Clear and Coherent  Volume:  Normal  Mood:  Anxious  Affect:  NA  Thought Process:  Goal Directed  Orientation:  Full (Time, Place, and Person)  Thought Content:  WDL  Suicidal Thoughts:  No  Homicidal Thoughts:  No  Memory:  Immediate;   Good Recent;   Good Remote;   Good  Judgement:  Intact  Insight:  Present  Psychomotor Activity:  NA  Concentration:  Concentration: Good and Attention Span: Good  Recall:  Good  Fund of Knowledge:  Good  Language:  Good  Akathisia:  No  Handed:  Right  AIMS (if indicated):     Assets:  Communication Skills Desire for Improvement Housing Resilience Social Support  ADL's:  Intact  Cognition:  WNL  Sleep:   ok. 6-7 hrs      Assessment and Plan: Generalized anxiety disorder. Panic attacks.  Recommend not to cut down 2 mg tablet from PCP twice as it may lose the efficacy. We will provide lorazepam 0.5 mg and he can take half to 1 tablet twice a day as needed. He is trying to get Trintellix samples from patient's assistance program and also trying to get disability. He feels the Trintellix is working. He does not want to change. He is getting trazodone from PCP and he takes 75 mg. I  recommend to call us back if is any question or any concern. Follow-up in 3 months. We discussed polypharmacy but he is slowly and gradually cutting down his muscle relaxant and pain medicine.  Follow Up Instructions:    I discussed the assessment and treatment plan with the patient. The patient was provided an opportunity to ask questions and all were answered. The patient agreed with the plan and demonstrated an understanding of the instructions.   The patient was advised to call back or seek an in-person evaluation if the symptoms  worsen or if the condition fails to improve as anticipated.  I provided 19 minutes of non-face-to-face time during this encounter.   [name], MD

## 2020-09-03 DIAGNOSIS — L239 Allergic contact dermatitis, unspecified cause: Secondary | ICD-10-CM | POA: Diagnosis not present

## 2020-09-06 DIAGNOSIS — L235 Allergic contact dermatitis due to other chemical products: Secondary | ICD-10-CM | POA: Diagnosis not present

## 2020-09-13 ENCOUNTER — Telehealth (HOSPITAL_COMMUNITY): Payer: Self-pay | Admitting: *Deleted

## 2020-09-13 NOTE — Telephone Encounter (Signed)
Spoke with pt regarding Ativan 2mg . Pt states that he still has some but definietly does not want to go back to that high dose. Pt agreed to take 1mg  bid but also stated that he was interested in the neuro consult. Pt instructed to call nurse back on Monday to reevaluate symptoms. Of course may call tomorrow if feeling worse.

## 2020-09-13 NOTE — Telephone Encounter (Signed)
Thanks for update

## 2020-09-13 NOTE — Telephone Encounter (Signed)
Writer spoke with pt who called with c/o what sounds like possible akathisia like symptoms. Pt describes feeling "jittery and restless" despite taking Ativan. This of course is making him more anxious. Please review and advise.

## 2020-09-13 NOTE — Telephone Encounter (Signed)
We have not changed his medication.  He was getting Ativan from his PCP 2 mg and cutting down to take 0.5 mg and we provided a new prescription of 0.5.  He can go back to his previous Ativan prescribed by his PCP and if symptoms do not improve then he may need to consider neurology.  His Trintellix and trazodone has been not changed in a while.

## 2020-09-17 ENCOUNTER — Telehealth (HOSPITAL_COMMUNITY): Payer: Self-pay | Admitting: *Deleted

## 2020-09-17 NOTE — Telephone Encounter (Signed)
Thanks

## 2020-09-17 NOTE — Telephone Encounter (Signed)
Pt assistance for Trintellix approved by [employer]. Approval for one year.

## 2020-09-18 ENCOUNTER — Telehealth (HOSPITAL_COMMUNITY): Payer: Self-pay | Admitting: *Deleted

## 2020-09-18 NOTE — Telephone Encounter (Signed)
Thanks

## 2020-09-18 NOTE — Telephone Encounter (Signed)
Pt assistance for Trintellix 20mg  has been approved by [employer]. Approval valid for one year.

## 2020-09-25 ENCOUNTER — Other Ambulatory Visit (HOSPITAL_COMMUNITY): Payer: Self-pay | Admitting: *Deleted

## 2020-09-25 DIAGNOSIS — F411 Generalized anxiety disorder: Secondary | ICD-10-CM

## 2020-09-25 DIAGNOSIS — F410 Panic disorder [episodic paroxysmal anxiety] without agoraphobia: Secondary | ICD-10-CM

## 2020-10-02 DIAGNOSIS — M47816 Spondylosis without myelopathy or radiculopathy, lumbar region: Secondary | ICD-10-CM | POA: Diagnosis not present

## 2020-10-02 DIAGNOSIS — M542 Cervicalgia: Secondary | ICD-10-CM | POA: Diagnosis not present

## 2020-10-02 DIAGNOSIS — M5416 Radiculopathy, lumbar region: Secondary | ICD-10-CM | POA: Diagnosis not present

## 2020-10-17 ENCOUNTER — Other Ambulatory Visit: Payer: Self-pay | Admitting: Family Medicine

## 2020-10-17 DIAGNOSIS — F321 Major depressive disorder, single episode, moderate: Secondary | ICD-10-CM | POA: Diagnosis not present

## 2020-10-17 DIAGNOSIS — I10 Essential (primary) hypertension: Secondary | ICD-10-CM | POA: Diagnosis not present

## 2020-10-17 DIAGNOSIS — E041 Nontoxic single thyroid nodule: Secondary | ICD-10-CM | POA: Diagnosis not present

## 2020-10-17 DIAGNOSIS — F410 Panic disorder [episodic paroxysmal anxiety] without agoraphobia: Secondary | ICD-10-CM | POA: Diagnosis not present

## 2020-10-17 DIAGNOSIS — H9202 Otalgia, left ear: Secondary | ICD-10-CM | POA: Diagnosis not present

## 2020-10-17 DIAGNOSIS — Z125 Encounter for screening for malignant neoplasm of prostate: Secondary | ICD-10-CM | POA: Diagnosis not present

## 2020-10-17 DIAGNOSIS — E7800 Pure hypercholesterolemia, unspecified: Secondary | ICD-10-CM | POA: Diagnosis not present

## 2020-10-17 DIAGNOSIS — Z87891 Personal history of nicotine dependence: Secondary | ICD-10-CM | POA: Diagnosis not present

## 2020-10-17 DIAGNOSIS — F5101 Primary insomnia: Secondary | ICD-10-CM | POA: Diagnosis not present

## 2020-10-17 DIAGNOSIS — Z0001 Encounter for general adult medical examination with abnormal findings: Secondary | ICD-10-CM | POA: Diagnosis not present

## 2020-10-17 DIAGNOSIS — I700 Atherosclerosis of aorta: Secondary | ICD-10-CM | POA: Diagnosis not present

## 2020-10-17 DIAGNOSIS — Z79899 Other long term (current) drug therapy: Secondary | ICD-10-CM | POA: Diagnosis not present

## 2020-11-08 ENCOUNTER — Ambulatory Visit
Admission: RE | Admit: 2020-11-08 | Discharge: 2020-11-08 | Disposition: A | Discharge status: Home / Self Care | Payer: Medicare HMO | Source: Ambulatory Visit | Attending: Family Medicine | Admitting: Family Medicine

## 2020-11-08 DIAGNOSIS — E041 Nontoxic single thyroid nodule: Secondary | ICD-10-CM | POA: Diagnosis not present

## 2020-11-20 ENCOUNTER — Telehealth (HOSPITAL_COMMUNITY): Payer: Self-pay | Admitting: *Deleted

## 2020-11-20 ENCOUNTER — Encounter (HOSPITAL_COMMUNITY): Payer: Self-pay | Admitting: Psychiatry

## 2020-11-20 ENCOUNTER — Telehealth (INDEPENDENT_AMBULATORY_CARE_PROVIDER_SITE_OTHER): Payer: Medicare HMO | Admitting: Psychiatry

## 2020-11-20 ENCOUNTER — Other Ambulatory Visit: Payer: Self-pay | Admitting: Family Medicine

## 2020-11-20 ENCOUNTER — Other Ambulatory Visit: Payer: Self-pay

## 2020-11-20 DIAGNOSIS — F410 Panic disorder [episodic paroxysmal anxiety] without agoraphobia: Secondary | ICD-10-CM | POA: Diagnosis not present

## 2020-11-20 DIAGNOSIS — F411 Generalized anxiety disorder: Secondary | ICD-10-CM | POA: Diagnosis not present

## 2020-11-20 DIAGNOSIS — E041 Nontoxic single thyroid nodule: Secondary | ICD-10-CM

## 2020-11-20 NOTE — Progress Notes (Signed)
Virtual Visit via Telephone Note  I connected with Kameryn Tisdel Sr. on 11/20/20 at  9:00 AM EST by telephone and verified that I am speaking with the correct person using two identifiers.  Location: Patient: Home Provider: Home Office   I discussed the limitations, risks, security and privacy concerns of performing an evaluation and management service by telephone and the availability of in person appointments. I also discussed with the patient that there may be a patient responsible charge related to this service. The patient expressed understanding and agreed to proceed.   History of Present Illness: Patient is evaluated by phone session.  He is on the phone by himself.  He had called our office in November complaining of feeling very emotional, shakes tremors.  At that time he was trying to cut down his Ativan and possibly have withdrawal symptoms.  He is now taking Ativan 2 mg but cut down his tablet and take 0.5 mg twice a day and 0.75 mg at bedtime.  Do not have these symptoms but is still very anxious about his general health.  His Trintellix 20 mg is now approved but now he noticed that he does not feel it is keeping him stable.  He has a lot of questions about his prognosis, how long he has to take the Trintellix.  He feels that he is taking too much Trintellix.  Denies any dizziness but is still have symptoms that he does not leave the house and feels very anxious and nervous.  Recently he had a visit to his PCP Dr. Delon Sacramento at [facility].  He was told that he had a thyroid nodule and need biopsy.  He also concerned about 4 pound weight gain.  He tried to walk every day but not sure why he is gaining weight.  He had a good Christmas.  His living situation is okay and there has been no changes.  He lives with his wife who is a Marine scientist and very supportive.  He takes trazodone which helps his sleep.  He denies any major panic attack but is still feel nervous and anxious.   Past  psychiatric history; H/Oanxiety since early 34s. 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 No h/omania, psychosis, suicidal attempt, inpatient treatment. Seen and managed byPCPsince early 34s. TriedRemeron, hydroxyzine, gabapentin, Paxil, Prozac, Zoloft and Lamictal. Stopped due to tremors or sexual side effects.  Psychiatric Specialty Exam: Physical Exam  Review of Systems  Weight 204 lb (92.5 kg).There is no height or weight on file to calculate BMI.  General Appearance: NA  Eye Contact:  NA  Speech:  Clear and Coherent  Volume:  Normal  Mood:  Anxious  Affect:  NA  Thought Process:  Goal Directed  Orientation:  Full (Time, Place, and Person)  Thought Content:  Rumination  Suicidal Thoughts:  No  Homicidal Thoughts:  No  Memory:  Immediate;   Good Recent;   Good Remote;   Good  Judgement:  Intact  Insight:  Present  Psychomotor Activity:  NA  Concentration:  Concentration: Good and Attention Span: Good  Recall:  Good  Fund of Knowledge:  Good  Language:  Good  Akathisia:  No  Handed:  Right  AIMS (if indicated):     Assets:  Communication Skills Desire for Improvement Housing Transportation  ADL's:  Intact  Cognition:  WNL  Sleep:   6 hrs      Assessment and Plan: Generalized anxiety disorder.  Panic attacks.  Since back on Ativan prescribed by PCP to take more than 1 mg a day his withdrawal symptoms are not as bad.  However he is not sure if he is taking right dose of Trintellix.  He believes he is taking higher dose.  I recommend he can try cutting down Trintellix 10 mg to see if higher dose is causing any side effects.  However if he does not feel any improvement and actually feels worsening of anxiety then he should go back to 20 mg a day.  We also talked about considering antiepileptics if he like to cut down the Ativan but he needs some time to think about it.  His Trintellix is approved from [employer].  We will get the  blood work and progress notes from his PCP.  Recommended to call us back if is any question or any concern.  Follow-up in 3 months.  Follow Up Instructions:    I discussed the assessment and treatment plan with the patient. The patient was provided an opportunity to ask questions and all were answered. The patient agreed with the plan and demonstrated an understanding of the instructions.   The patient was advised to call back or seek an in-person evaluation if the symptoms worsen or if the condition fails to improve as anticipated.  I provided 28 minutes of non-face-to-face time during this encounter.   [name], MD

## 2020-11-20 NOTE — Telephone Encounter (Signed)
Pt advised to come in to office and sign ROI for PCP, Dr. Delon Sacramento with [facility], so they may send this office copy of recent labs and progress notes. Pt verbalizes understanding. Pt has been receiving the Trintellix via PAP.

## 2020-11-30 ENCOUNTER — Other Ambulatory Visit (HOSPITAL_COMMUNITY)
Admission: RE | Admit: 2020-11-30 | Discharge: 2020-11-30 | Disposition: A | Discharge status: Home / Self Care | Payer: Medicare HMO | Source: Ambulatory Visit | Attending: Radiology | Admitting: Radiology

## 2020-11-30 ENCOUNTER — Ambulatory Visit
Admission: RE | Admit: 2020-11-30 | Discharge: 2020-11-30 | Disposition: A | Discharge status: Home / Self Care | Payer: Medicare HMO | Source: Ambulatory Visit | Attending: Family Medicine | Admitting: Family Medicine

## 2020-11-30 DIAGNOSIS — R896 Abnormal cytological findings in specimens from other organs, systems and tissues: Secondary | ICD-10-CM | POA: Diagnosis not present

## 2020-11-30 DIAGNOSIS — E041 Nontoxic single thyroid nodule: Secondary | ICD-10-CM | POA: Diagnosis not present

## 2020-12-04 DIAGNOSIS — D485 Neoplasm of uncertain behavior of skin: Secondary | ICD-10-CM | POA: Diagnosis not present

## 2020-12-04 DIAGNOSIS — L910 Hypertrophic scar: Secondary | ICD-10-CM | POA: Diagnosis not present

## 2020-12-04 DIAGNOSIS — D2262 Melanocytic nevi of left upper limb, including shoulder: Secondary | ICD-10-CM | POA: Diagnosis not present

## 2020-12-04 LAB — CYTOLOGY - NON PAP

## 2020-12-06 DIAGNOSIS — E041 Nontoxic single thyroid nodule: Secondary | ICD-10-CM | POA: Diagnosis not present

## 2020-12-17 ENCOUNTER — Other Ambulatory Visit: Payer: Self-pay | Admitting: *Deleted

## 2020-12-17 DIAGNOSIS — Z87891 Personal history of nicotine dependence: Secondary | ICD-10-CM

## 2020-12-18 ENCOUNTER — Encounter (HOSPITAL_COMMUNITY): Payer: Self-pay

## 2020-12-19 DIAGNOSIS — M542 Cervicalgia: Secondary | ICD-10-CM | POA: Diagnosis not present

## 2020-12-19 DIAGNOSIS — M47816 Spondylosis without myelopathy or radiculopathy, lumbar region: Secondary | ICD-10-CM | POA: Diagnosis not present

## 2021-01-01 DIAGNOSIS — D3500 Benign neoplasm of unspecified adrenal gland: Secondary | ICD-10-CM | POA: Diagnosis not present

## 2021-01-01 DIAGNOSIS — E042 Nontoxic multinodular goiter: Secondary | ICD-10-CM | POA: Diagnosis not present

## 2021-01-01 DIAGNOSIS — E0590 Thyrotoxicosis, unspecified without thyrotoxic crisis or storm: Secondary | ICD-10-CM | POA: Diagnosis not present

## 2021-01-02 ENCOUNTER — Other Ambulatory Visit: Payer: Self-pay | Admitting: Endocrinology

## 2021-01-02 DIAGNOSIS — E042 Nontoxic multinodular goiter: Secondary | ICD-10-CM

## 2021-01-11 ENCOUNTER — Encounter: Payer: Medicare HMO | Admitting: Primary Care

## 2021-01-11 ENCOUNTER — Ambulatory Visit: Payer: Medicare HMO

## 2021-01-21 NOTE — Patient Instructions (Signed)
Thank you for participating in the  Lung Cancer Screening Program. It was our pleasure to meet you today. We will call you with the results of your scan within the next few days. Your scan will be assigned a Lung RADS category score by the physicians reading the scans.  This Lung RADS score determines follow up scanning.  See below for description of categories, and follow up screening recommendations. We will be in touch to schedule your follow up screening annually or based on recommendations of our providers. We will fax a copy of your scan results to your Primary Care Physician, or the physician who referred you to the program, to ensure they have the results. Please call the office if you have any questions or concerns regarding your scanning experience or results.  Our office number is [phone]. Please speak with Denise Phelps, RN. She is our Lung Cancer Screening RN. If she is unavailable when you call, please have the office staff send her a message. She will return your call at her earliest convenience. Remember, if your scan is normal, we will scan you annually as long as you continue to meet the criteria for the program. (Age 55-77, Current smoker or smoker who has quit within the last 15 years). If you are a smoker, remember, quitting is the single most powerful action that you can take to decrease your risk of lung cancer and other pulmonary, breathing related problems. We know quitting is hard, and we are here to help.  Please let us know if there is anything we can do to help you meet your goal of quitting. If you are a former smoker, congratulations. We are proud of you! Remain smoke free! Remember you can refer friends or family members through the number above.  We will screen them to make sure they meet criteria for the program. Thank you for helping us take better care of you by participating in Lung Screening.  Lung RADS Categories:  Lung RADS 1: no nodules  or definitely non-concerning nodules.  Recommendation is for a repeat annual scan in 12 months.  Lung RADS 2:  nodules that are non-concerning in appearance and behavior with a very low likelihood of becoming an active cancer. Recommendation is for a repeat annual scan in 12 months.  Lung RADS 3: nodules that are probably non-concerning , includes nodules with a low likelihood of becoming an active cancer.  Recommendation is for a 6-month repeat screening scan. Often noted after an upper respiratory illness. We will be in touch to make sure you have no questions, and to schedule your 6-month scan.  Lung RADS 4 A: nodules with concerning findings, recommendation is most often for a follow up scan in 3 months or additional testing based on our provider's assessment of the scan. We will be in touch to make sure you have no questions and to schedule the recommended 3 month follow up scan.  Lung RADS 4 B:  indicates findings that are concerning. We will be in touch with you to schedule additional diagnostic testing based on our provider's  assessment of the scan.   

## 2021-01-21 NOTE — Progress Notes (Signed)
Shared Decision Making Visit Lung Cancer Screening Program [phone])   Eligibility:  Age 60 y.o.  Pack Years Smoking History Calculation  (# packs/per year x # years smoked)  Recent History of coughing up blood  no  Unexplained weight loss? no ( >Than 15 pounds within the last 6 months )  Prior History Lung / other cancer no (Diagnosis within the last 5 years already requiring surveillance chest CT Scans).  Smoking Status Former Smoker  Former Smokers: Years since quit: 4 years  Quit Date: 2018  Visit Components:  Discussion included one or more decision making aids. yes  Discussion included risk/benefits of screening. yes  Discussion included potential follow up diagnostic testing for abnormal scans. yes  Discussion included meaning and risk of over diagnosis. yes  Discussion included meaning and risk of False Positives. yes  Discussion included meaning of total radiation exposure. yes  Counseling Included:  Importance of adherence to annual lung cancer LDCT screening. yes  Impact of comorbidities on ability to participate in the program. yes  Ability and willingness to under diagnostic treatment. yes  Smoking Cessation Counseling:  Current Smokers:   Discussed importance of smoking cessation. yes  Information about tobacco cessation classes and interventions provided to patient. yes  Patient provided with "ticket" for LDCT Scan. yes  Symptomatic Patient. no  Counseling(Intermediate counseling: > three minutes) 99406  Diagnosis Code: Tobacco Use Z72.0  Asymptomatic Patient yes  Counseling (Intermediate counseling: > three minutes counseling) B2841  Former Smokers:   Discussed the importance of maintaining cigarette abstinence. yes  Diagnosis Code: Personal History of Nicotine Dependence. L24.401  Information about tobacco cessation classes and interventions provided to patient. Yes  Patient provided with "ticket" for LDCT Scan. yes  Written  Order for Lung Cancer Screening with LDCT placed in Epic. Yes (CT Chest Lung Cancer Screening Low Dose W/O CM) [id number] Z12.2-Screening of respiratory organs Z87.891-Personal history of nicotine dependence  I have spent 25 minutes of face to face time with [name] [name] discussing the risks and benefits of lung cancer screening. We viewed a power point together that explained in detail the above noted topics. We paused at intervals to allow for questions to be asked and answered to ensure understanding.We discussed that the single most powerful action that he can take to decrease his risk of developing lung cancer is to quit smoking. We discussed whether or not he is ready to commit to setting a quit date. We discussed options for tools to aid in quitting smoking including nicotine replacement therapy, non-nicotine medications, support groups, Quit Smart classes, and behavior modification. We discussed that often times setting smaller, more achievable goals, such as eliminating 1 cigarette a day for a week and then 2 cigarettes a day for a week can be helpful in slowly decreasing the number of cigarettes smoked. This allows for a sense of accomplishment as well as providing a clinical benefit. I have also given him [name] [name] card and contact information in the event he needs to contact me. We discussed the time and location of the scan, and that either Doroteo Glassman RN or I will call with the results within 24-48 hours of receiving them. I have offered him a copy of the power point we viewed  as a resource in the event they need reinforcement of the concepts we discussed today in the office. The patient verbalized understanding of all of  the above and had no further questions upon leaving the office. They have my contact information  in the event they have any further questions.  I spent 3 minutes counseling on smoking cessation and the health risks of continued tobacco abuse.  I explained to the patient  that there has been a high incidence of coronary artery disease noted on these exams. I explained that this is a non-gated exam therefore degree or severity cannot be determined. This patient is not on statin therapy. I have asked the patient to follow-up with their PCP regarding any incidental finding of coronary artery disease and management with diet or medication as their PCP  feels is clinically indicated. The patient verbalized understanding of the above and had no further questions upon completion of the visit.   PMH significant for Non neoplastic goiter, thyroid nodule- awaiting bx results, adrenal mass s/p adrenalectomy, kidney stones, adenomatous polyp, right hemicolectomy, small bowel resection, appendix removed   [name] Ehrich, NP

## 2021-01-22 ENCOUNTER — Ambulatory Visit (INDEPENDENT_AMBULATORY_CARE_PROVIDER_SITE_OTHER)
Admission: RE | Admit: 2021-01-22 | Discharge: 2021-01-22 | Disposition: A | Discharge status: Home / Self Care | Payer: Medicare HMO | Source: Ambulatory Visit | Attending: Acute Care | Admitting: Acute Care

## 2021-01-22 ENCOUNTER — Ambulatory Visit (INDEPENDENT_AMBULATORY_CARE_PROVIDER_SITE_OTHER): Payer: Medicare HMO | Admitting: Primary Care

## 2021-01-22 ENCOUNTER — Other Ambulatory Visit: Payer: Self-pay

## 2021-01-22 ENCOUNTER — Encounter: Payer: Self-pay | Admitting: Primary Care

## 2021-01-22 VITALS — BP 126/84 | HR 101 | Temp 97.8°F | Ht 70.0 in | Wt 204.4 lb

## 2021-01-22 DIAGNOSIS — Z87891 Personal history of nicotine dependence: Secondary | ICD-10-CM | POA: Diagnosis not present

## 2021-01-28 ENCOUNTER — Telehealth: Payer: Self-pay | Admitting: Acute Care

## 2021-01-28 DIAGNOSIS — Z87891 Personal history of nicotine dependence: Secondary | ICD-10-CM

## 2021-01-28 NOTE — Telephone Encounter (Signed)
Pt informed of CT results per Sarah Groce, NP.  PT verbalized understanding.  Copy sent to PCP.  Order placed for 1 yr f/u CT.  

## 2021-02-18 ENCOUNTER — Telehealth (INDEPENDENT_AMBULATORY_CARE_PROVIDER_SITE_OTHER): Payer: Medicare HMO | Admitting: Psychiatry

## 2021-02-18 ENCOUNTER — Other Ambulatory Visit: Payer: Self-pay

## 2021-02-18 ENCOUNTER — Encounter (HOSPITAL_COMMUNITY): Payer: Self-pay | Admitting: Psychiatry

## 2021-02-18 DIAGNOSIS — F411 Generalized anxiety disorder: Secondary | ICD-10-CM

## 2021-02-18 DIAGNOSIS — F410 Panic disorder [episodic paroxysmal anxiety] without agoraphobia: Secondary | ICD-10-CM

## 2021-02-18 NOTE — Progress Notes (Signed)
Virtual Visit via Telephone Note  I connected with [name] Mallek Sr. on 02/18/21 at  9:00 AM EDT by telephone and verified that I am speaking with the correct person using two identifiers.  Location: Patient: home Provider: home office   I discussed the limitations, risks, security and privacy concerns of performing an evaluation and management service by telephone and the availability of in person appointments. I also discussed with the patient that there may be a patient responsible charge related to this service. The patient expressed understanding and agreed to proceed.   History of Present Illness: Patient is evaluated by phone session.  He is taking Trintellix 10 mg as 20 mg he cannot tolerate.  He is taking Ativan 1.5 mg and also taking trazodone 50-75 mg from his PCP which is helping his sleep.  He is still struggle with anxiety but denies any major panic attack.  He feels his depression is a stable and he denies any recent crying spells or any feeling of hopelessness or worthlessness.  His appetite is okay.  His weight is stable.  He does 30-minute power walk and that helps him a lot.  He has no tremors, shakes or any EPS.  We are still awaiting records and blood work from his PCP Dr. Delon Sacramento from [place].  He is taking multiple antihypertensive medication.  He is requesting to prescribe medicine for erectile dysfunction however I recommend he should consult his PCP.  Patient is comfortable on the current dose and does not want to change the medication.  He lives with his wife who is a Marine scientist.   Past psychiatric history; H/Oanxiety since early 89s. 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 No h/omania, psychosis, suicidal attempt, inpatient treatment. Seen and managed byPCPsince early 32s. TriedRemeron, hydroxyzine, gabapentin, Paxil, Prozac, Zoloft and Lamictal. Stopped due to tremors or sexual side effects.  Psychiatric Specialty Exam: Physical  Exam  Review of Systems  Weight 200 lb (90.7 kg).There is no height or weight on file to calculate BMI.  General Appearance: NA  Eye Contact:  NA  Speech:  Clear and Coherent and Slow  Volume:  Decreased  Mood:  Anxious  Affect:  NA  Thought Process:  Descriptions of Associations: Intact  Orientation:  Full (Time, Place, and Person)  Thought Content:  Rumination  Suicidal Thoughts:  No  Homicidal Thoughts:  No  Memory:  Immediate;   Good Recent;   Good Remote;   Fair  Judgement:  Intact  Insight:  Present  Psychomotor Activity:  NA  Concentration:  Concentration: Good and Attention Span: Good  Recall:  Good  Fund of Knowledge:  Good  Language:  Good  Akathisia:  No  Handed:  Right  AIMS (if indicated):     Assets:  Communication Skills Desire for Improvement Housing Transportation  ADL's:  Intact  Cognition:  WNL  Sleep:   7 hrs      Assessment and Plan: Generalized anxiety disorder.  Panic attacks.  Patient is taking Trintellix 10 mg from patient assistance program and so far no concerns or side effects.  He is also taking Ativan and trazodone from his PCP.  He is not interested in therapy.  Discussed medication side effects and benefits.  Recommended to call us back if is any question or any concern.  Follow-up in 3 months.  Follow Up Instructions:    I discussed the assessment and treatment plan with the patient. The patient was provided an  opportunity to ask questions and all were answered. The patient agreed with the plan and demonstrated an understanding of the instructions.   The patient was advised to call back or seek an in-person evaluation if the symptoms worsen or if the condition fails to improve as anticipated.  I provided 18 minutes of non-face-to-face time during this encounter.   [name] Nations, MD

## 2021-02-19 NOTE — Progress Notes (Signed)
Please see 01/28/2021 telephone note. Pt. Called results by Doroteo Glassman RN Lung RADS 1, negative study: no nodules or definitely benign nodules. Radiology recommendation is for a repeat LDCT in 12 months.  CAD/ Emphysema Please fax results to PCP and have patient follow up with PCP regarding CAD Place order for 12 month follow up. Thanks so much

## 2021-03-05 DIAGNOSIS — I700 Atherosclerosis of aorta: Secondary | ICD-10-CM | POA: Diagnosis not present

## 2021-03-05 DIAGNOSIS — Z79899 Other long term (current) drug therapy: Secondary | ICD-10-CM | POA: Diagnosis not present

## 2021-03-05 DIAGNOSIS — F411 Generalized anxiety disorder: Secondary | ICD-10-CM | POA: Diagnosis not present

## 2021-03-05 DIAGNOSIS — N529 Male erectile dysfunction, unspecified: Secondary | ICD-10-CM | POA: Diagnosis not present

## 2021-03-05 DIAGNOSIS — E7800 Pure hypercholesterolemia, unspecified: Secondary | ICD-10-CM | POA: Diagnosis not present

## 2021-03-05 DIAGNOSIS — E041 Nontoxic single thyroid nodule: Secondary | ICD-10-CM | POA: Diagnosis not present

## 2021-03-05 DIAGNOSIS — J439 Emphysema, unspecified: Secondary | ICD-10-CM | POA: Diagnosis not present

## 2021-03-05 DIAGNOSIS — R195 Other fecal abnormalities: Secondary | ICD-10-CM | POA: Diagnosis not present

## 2021-03-20 DIAGNOSIS — M5416 Radiculopathy, lumbar region: Secondary | ICD-10-CM | POA: Diagnosis not present

## 2021-03-20 DIAGNOSIS — M542 Cervicalgia: Secondary | ICD-10-CM | POA: Diagnosis not present

## 2021-03-20 DIAGNOSIS — M47816 Spondylosis without myelopathy or radiculopathy, lumbar region: Secondary | ICD-10-CM | POA: Diagnosis not present

## 2021-03-20 DIAGNOSIS — M25551 Pain in right hip: Secondary | ICD-10-CM | POA: Diagnosis not present

## 2021-04-16 ENCOUNTER — Telehealth (HOSPITAL_COMMUNITY): Payer: Self-pay | Admitting: *Deleted

## 2021-04-16 NOTE — Telephone Encounter (Signed)
He is getting Trintellix from [name] and if he is taking he should not be withdrawal from that.  His Ativan is given by his PCP and he can contact his PCP.  Not sure why having tremors because we have not changed or add any medication.  He may need to contact the neurology for tremors.

## 2021-04-16 NOTE — Telephone Encounter (Signed)
Pt called with c/o having tremors, which are getting worse. Pt describes tremors as if "I was withdrawing", and is very upset about this. Pt says the Ativan isn't helping and would you consider adding or changing something to med regime. Next appointment is on 05/21/21. Please review and advise.

## 2021-04-16 NOTE — Telephone Encounter (Signed)
Ok

## 2021-04-16 NOTE — Telephone Encounter (Addendum)
Right. He doesn't know why and wondered if you might be able to give him "something" to help. FYI. Do you want me to make referral to [facility]?

## 2021-04-16 NOTE — Telephone Encounter (Signed)
He need to call us PCP first.  We may refer him to go to neurology if he needs treatment for the tremors.

## 2021-04-18 ENCOUNTER — Other Ambulatory Visit: Payer: Self-pay

## 2021-04-18 ENCOUNTER — Encounter (HOSPITAL_COMMUNITY): Payer: Self-pay | Admitting: Psychiatry

## 2021-04-18 ENCOUNTER — Telehealth (INDEPENDENT_AMBULATORY_CARE_PROVIDER_SITE_OTHER): Payer: Medicare HMO | Admitting: Psychiatry

## 2021-04-18 VITALS — Wt 204.0 lb

## 2021-04-18 DIAGNOSIS — F410 Panic disorder [episodic paroxysmal anxiety] without agoraphobia: Secondary | ICD-10-CM

## 2021-04-18 DIAGNOSIS — F411 Generalized anxiety disorder: Secondary | ICD-10-CM | POA: Diagnosis not present

## 2021-04-18 NOTE — Progress Notes (Signed)
Virtual Visit via Telephone Note  I connected with [name] Westrup Sr. on 04/18/21 at 10:20 AM EDT by telephone and verified that I am speaking with the correct person using two identifiers.  Location: Patient: Home Provider: Home Office   I discussed the limitations, risks, security and privacy concerns of performing an evaluation and management service by telephone and the availability of in person appointments. I also discussed with the patient that there may be a patient responsible charge related to this service. The patient expressed understanding and agreed to proceed.   History of Present Illness: Patient is evaluated by phone session.  He requested an earlier appointment because the past few days he was having severe anxiety, nervous tremors and having panic attacks.  He is not sure what triggered her these symptoms but admitted that he had cut down his Ativan to 1 a day.  He is taking Trintellix 10 mg daily.  He called his PCP who recommended to contact psychiatrist.  I had a long discussion with the patient about withdrawal of benzodiazepine.  Patient like to come off but every time when he tried to cut down he feels very nervous, anxious and having tremors.  We have offered in the past buspirone, gabapentin, hydroxyzine and other SSRIs to help with anxiety/patient not interested because he had tried in the past and did not like the side effects.  He tried Lamictal, SSRIs but again he stopped due to side effects.  Patient denies any suicidal thoughts, paranoia, hallucination, anger, homicidal thoughts or any delusions.  He feels otherwise his anxiety is under control.  He does 30 minutes power walk.  He feels that Trintellix is helping and he is able to function but he is worried about having these episodes whenever he cut down his lorazepam.  His lorazepam is prescribed by his PCP.  He is taking 0.5 mg twice a day but recently he cut down to 1 a day and symptoms back.  Patient denies any  change in his living situation.  He lives with his wife who is a Marine scientist and very supportive.   Past psychiatric history; H/O anxiety since early 39s.  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  No h/o mania, psychosis, suicidal attempt, inpatient treatment.  Seen and managed by PCP since early 32s. Tried Remeron, hydroxyzine, gabapentin, Paxil, Prozac, Zoloft and Lamictal.  Stopped due to tremors or sexual side effects.    Psychiatric Specialty Exam: Physical Exam  Review of Systems  Weight 204 lb (92.5 kg).There is no height or weight on file to calculate BMI.  General Appearance: NA  Eye Contact:  NA  Speech:  Clear and Coherent  Volume:  Decreased  Mood:  Anxious  Affect:  NA  Thought Process:  Goal Directed  Orientation:  Full (Time, Place, and Person)  Thought Content:  Logical  Suicidal Thoughts:  No  Homicidal Thoughts:  No  Memory:  Immediate;   Good Recent;   Good Remote;   Good  Judgement:  Fair  Insight:  Fair  Psychomotor Activity:  NA  Concentration:  Concentration: Good and Attention Span: Good  Recall:  Good  Fund of Knowledge:  Good  Language:  Good  Akathisia:  No  Handed:  Right  AIMS (if indicated):     Assets:  Communication Skills Desire for Improvement Housing Transportation  ADL's:  Intact  Cognition:  WNL  Sleep:   7 hrs  Assessment and Plan: Generalized anxiety disorder.  Panic attacks.  Possible withdrawal from benzodiazepine.  I discussed these episodes that could be due to reducing the Ativan and in the past he had these episodes when he tried to come off quickly.  We talked about benzodiazepine dependence tolerance and withdrawal.  We discussed his body may be dependent on it and he may need to keep it current dose for a while since patient does not want to try any other medication for anxiety.  I also suggested that he should consider neurology consult but patient is not interested.  Patient like to get his Ativan  prescription 0.5 mg twice a day from our office which I agree.  However he explained we will keep the current dose and we will not increase since patient at some point like to come off.  Patient agreed with the plan.  We will keep the Trintellix 10 mg which he is getting from patient's assistance program.  He will call us when he need a new prescription of Ativan.  He is getting trazodone from his PCP.  He is sleeping good.  Patient also not interested in therapy.  Follow-up in 3 months.  Follow Up Instructions:    I discussed the assessment and treatment plan with the patient. The patient was provided an opportunity to ask questions and all were answered. The patient agreed with the plan and demonstrated an understanding of the instructions.   The patient was advised to call back or seek an in-person evaluation if the symptoms worsen or if the condition fails to improve as anticipated.  I provided 16 minutes of non-face-to-face time during this encounter.   [name], MD

## 2021-04-30 ENCOUNTER — Telehealth (HOSPITAL_COMMUNITY): Payer: Self-pay | Admitting: *Deleted

## 2021-04-30 DIAGNOSIS — F410 Panic disorder [episodic paroxysmal anxiety] without agoraphobia: Secondary | ICD-10-CM

## 2021-04-30 MED ORDER — LORAZEPAM 0.5 MG PO TABS: 0.5000 mg | ORAL_TABLET | Freq: Two times a day (BID) | ORAL | 2 refills | Status: DC

## 2021-04-30 NOTE — Telephone Encounter (Signed)
Send to [employer] at [facility].

## 2021-04-30 NOTE — Telephone Encounter (Signed)
Pt called requesting a refill of Ativan 0.5 mg as discussed during his last visit on 04/18/21. Previously managed by PCP. Pt next appointment is scheduled for 07/18/21. Please review.

## 2021-05-20 ENCOUNTER — Telehealth (HOSPITAL_COMMUNITY): Payer: Self-pay | Admitting: *Deleted

## 2021-05-20 NOTE — Telephone Encounter (Signed)
He can keep the current dose. Sopping the medication will worse the anxiety.

## 2021-05-20 NOTE — Telephone Encounter (Signed)
He need to see neurologist because he is complaining of tremors headaches and these concerns should be addressed by neurologist.  We have tried a lot of medication and unfortunately he is very sensitive with psychotropic medication.  I would like to have neurology opinion.

## 2021-05-20 NOTE — Telephone Encounter (Signed)
Pt wants you to know that he has been taking the Lorazepam total dose of 1 mg which he says is "barely holding me together". Writer advised pt that a referral has been made to [place] but that he may need to go to UC for headaches or if anxiety and lability become overwhelming. Pt advised to continue Trintellix 10 mg qd. Pt verbalizes understanding.

## 2021-05-20 NOTE — Telephone Encounter (Signed)
This nurse spoke with pt who stated that he wanted to see you this week but is aware that schedule is booked. Pt has c/o increased lability, depression, irritability, increase in severity of tremors. Pt states that his head is shaking now too as well as having "horrible horrible headaches". Pt denies SI but says that he doesn't want to live like this; he wants help. Pt had decreased Trintellix to 5 mg although has been on the 10 mg for a month now (since last visit) and symptoms have worsened since then. Sleep and appetite decreased as well. Pt next appointment is on 9/22. Pt says he's always compliant with BP meds and bp is well controlled. Please review and advise.

## 2021-05-20 NOTE — Telephone Encounter (Signed)
Already done, but he wants to switch or know what to do with the Trintellix for now.

## 2021-05-21 ENCOUNTER — Telehealth (HOSPITAL_COMMUNITY): Payer: Medicare HMO | Admitting: Psychiatry

## 2021-06-03 ENCOUNTER — Emergency Department (HOSPITAL_COMMUNITY)
Admission: EM | Admit: 2021-06-03 | Discharge: 2021-06-03 | Disposition: A | Discharge status: Other Acute Inpatient Hospital | Payer: Medicare HMO | Attending: Emergency Medicine | Admitting: Emergency Medicine

## 2021-06-03 ENCOUNTER — Emergency Department (HOSPITAL_COMMUNITY): Payer: Medicare HMO

## 2021-06-03 ENCOUNTER — Other Ambulatory Visit (HOSPITAL_COMMUNITY)
Admission: EM | Admit: 2021-06-03 | Discharge: 2021-06-05 | Disposition: A | Discharge status: Home / Self Care | Payer: Medicare HMO | Attending: Psychiatry | Admitting: Psychiatry

## 2021-06-03 ENCOUNTER — Encounter (HOSPITAL_COMMUNITY): Payer: Self-pay | Admitting: *Deleted

## 2021-06-03 ENCOUNTER — Other Ambulatory Visit: Payer: Self-pay

## 2021-06-03 DIAGNOSIS — F410 Panic disorder [episodic paroxysmal anxiety] without agoraphobia: Secondary | ICD-10-CM | POA: Diagnosis not present

## 2021-06-03 DIAGNOSIS — Z79899 Other long term (current) drug therapy: Secondary | ICD-10-CM | POA: Insufficient documentation

## 2021-06-03 DIAGNOSIS — R0602 Shortness of breath: Secondary | ICD-10-CM | POA: Insufficient documentation

## 2021-06-03 DIAGNOSIS — K219 Gastro-esophageal reflux disease without esophagitis: Secondary | ICD-10-CM | POA: Insufficient documentation

## 2021-06-03 DIAGNOSIS — Z87891 Personal history of nicotine dependence: Secondary | ICD-10-CM | POA: Insufficient documentation

## 2021-06-03 DIAGNOSIS — R079 Chest pain, unspecified: Secondary | ICD-10-CM | POA: Insufficient documentation

## 2021-06-03 DIAGNOSIS — Z981 Arthrodesis status: Secondary | ICD-10-CM | POA: Insufficient documentation

## 2021-06-03 DIAGNOSIS — J449 Chronic obstructive pulmonary disease, unspecified: Secondary | ICD-10-CM | POA: Diagnosis not present

## 2021-06-03 DIAGNOSIS — E039 Hypothyroidism, unspecified: Secondary | ICD-10-CM | POA: Diagnosis not present

## 2021-06-03 DIAGNOSIS — F419 Anxiety disorder, unspecified: Secondary | ICD-10-CM | POA: Diagnosis not present

## 2021-06-03 DIAGNOSIS — F411 Generalized anxiety disorder: Secondary | ICD-10-CM | POA: Diagnosis not present

## 2021-06-03 DIAGNOSIS — J439 Emphysema, unspecified: Secondary | ICD-10-CM | POA: Diagnosis not present

## 2021-06-03 DIAGNOSIS — I10 Essential (primary) hypertension: Secondary | ICD-10-CM | POA: Insufficient documentation

## 2021-06-03 DIAGNOSIS — I700 Atherosclerosis of aorta: Secondary | ICD-10-CM | POA: Diagnosis not present

## 2021-06-03 DIAGNOSIS — R4182 Altered mental status, unspecified: Secondary | ICD-10-CM | POA: Diagnosis not present

## 2021-06-03 DIAGNOSIS — R519 Headache, unspecified: Secondary | ICD-10-CM | POA: Diagnosis not present

## 2021-06-03 DIAGNOSIS — Z20822 Contact with and (suspected) exposure to covid-19: Secondary | ICD-10-CM | POA: Insufficient documentation

## 2021-06-03 DIAGNOSIS — R45851 Suicidal ideations: Secondary | ICD-10-CM | POA: Diagnosis not present

## 2021-06-03 LAB — RESP PANEL BY RT-PCR (FLU A&B, COVID) ARPGX2
Influenza A by PCR: NEGATIVE
Influenza B by PCR: NEGATIVE
SARS Coronavirus 2 by RT PCR: NEGATIVE

## 2021-06-03 LAB — RAPID URINE DRUG SCREEN, HOSP PERFORMED
Amphetamines: NOT DETECTED
Barbiturates: NOT DETECTED
Benzodiazepines: NOT DETECTED
Cocaine: NOT DETECTED
Opiates: NOT DETECTED
Tetrahydrocannabinol: NOT DETECTED

## 2021-06-03 LAB — COMPREHENSIVE METABOLIC PANEL
ALT: 30 U/L (ref 0–44)
AST: 26 U/L (ref 15–41)
Albumin: 4.2 g/dL (ref 3.5–5.0)
Alkaline Phosphatase: 66 U/L (ref 38–126)
Anion gap: 9 (ref 5–15)
BUN: 8 mg/dL (ref 6–20)
CO2: 24 mmol/L (ref 22–32)
Calcium: 9.3 mg/dL (ref 8.9–10.3)
Chloride: 106 mmol/L (ref 98–111)
Creatinine, Ser: 0.76 mg/dL (ref 0.61–1.24)
GFR, Estimated: >60 mL/min (ref >60)
Glucose, Bld: 92 mg/dL (ref 70–99)
Potassium: 3.8 mmol/L (ref 3.5–5.1)
Sodium: 139 mmol/L (ref 135–145)
Total Bilirubin: 0.7 mg/dL (ref 0.3–1.2)
Total Protein: 7.1 g/dL (ref 6.5–8.1)

## 2021-06-03 LAB — CBC WITH DIFFERENTIAL/PLATELET
Abs Immature Granulocytes: 0.02 10*3/uL (ref 0.00–0.07)
Basophils Absolute: 0 10*3/uL (ref 0.0–0.1)
Basophils Relative: 0 %
Eosinophils Absolute: 0.1 10*3/uL (ref 0.0–0.5)
Eosinophils Relative: 1 %
HCT: 39.1 % (ref 39.0–52.0)
Hemoglobin: 12.6 g/dL — ABNORMAL LOW (ref 13.0–17.0)
Immature Granulocytes: 0 %
Lymphocytes Relative: 32 %
Lymphs Abs: 2.4 10*3/uL (ref 0.7–4.0)
MCH: 27.6 pg (ref 26.0–34.0)
MCHC: 32.2 g/dL (ref 30.0–36.0)
MCV: 85.7 fL (ref 80.0–100.0)
Monocytes Absolute: 0.5 10*3/uL (ref 0.1–1.0)
Monocytes Relative: 7 %
Neutro Abs: 4.5 10*3/uL (ref 1.7–7.7)
Neutrophils Relative %: 60 %
Platelets: 273 10*3/uL (ref 150–400)
RBC: 4.56 MIL/uL (ref 4.22–5.81)
RDW: 14.3 % (ref 11.5–15.5)
WBC: 7.5 10*3/uL (ref 4.0–10.5)
nRBC: 0 % (ref 0.0–0.2)

## 2021-06-03 LAB — ETHANOL: Alcohol, Ethyl (B): <10 mg/dL (ref <10)

## 2021-06-03 MED ORDER — VORTIOXETINE HBR 5 MG PO TABS
15.0000 mg | ORAL_TABLET | Freq: Every day | ORAL | Status: DC
  Filled 2021-06-03 (×3): qty 1

## 2021-06-03 MED ORDER — DIAZEPAM 5 MG PO TABS
5.0000 mg | ORAL_TABLET | Freq: Once | ORAL | Status: AC
  Administered 2021-06-03: 5 mg via ORAL
  Filled 2021-06-03: qty 1

## 2021-06-03 MED ORDER — METOCLOPRAMIDE HCL 5 MG/ML IJ SOLN
10.0000 mg | Freq: Once | INTRAMUSCULAR | Status: AC
  Administered 2021-06-03: 10 mg via INTRAVENOUS
  Filled 2021-06-03: qty 2

## 2021-06-03 MED ORDER — OXYCODONE-ACETAMINOPHEN 5-325 MG PO TABS
1.0000 | ORAL_TABLET | Freq: Four times a day (QID) | ORAL | Status: DC | PRN
  Administered 2021-06-03 – 2021-06-04 (×2): 1 via ORAL
  Filled 2021-06-03 (×3): qty 1

## 2021-06-03 MED ORDER — AMLODIPINE BESYLATE 10 MG PO TABS
10.0000 mg | ORAL_TABLET | Freq: Every day | ORAL | Status: DC
  Administered 2021-06-04 – 2021-06-05 (×2): 10 mg via ORAL
  Filled 2021-06-03 (×2): qty 1

## 2021-06-03 MED ORDER — TRAZODONE HCL 150 MG PO TABS
75.0000 mg | ORAL_TABLET | Freq: Every day | ORAL | Status: DC
  Administered 2021-06-03 – 2021-06-04 (×2): 75 mg via ORAL
  Filled 2021-06-03 (×3): qty 1

## 2021-06-03 MED ORDER — LORAZEPAM 1 MG PO TABS
1.0000 mg | ORAL_TABLET | Freq: Once | ORAL | Status: AC
  Administered 2021-06-03: 1 mg via ORAL
  Filled 2021-06-03: qty 1

## 2021-06-03 MED ORDER — CYCLOBENZAPRINE HCL 10 MG PO TABS: 5.0000 mg | ORAL_TABLET | Freq: Three times a day (TID) | ORAL | Status: DC | PRN

## 2021-06-03 MED ORDER — ALUM & MAG HYDROXIDE-SIMETH 200-200-20 MG/5ML PO SUSP: 30.0000 mL | ORAL | Status: DC | PRN

## 2021-06-03 MED ORDER — HYDROXYZINE HCL 25 MG PO TABS
50.0000 mg | ORAL_TABLET | Freq: Once | ORAL | Status: AC
  Administered 2021-06-03: 50 mg via ORAL
  Filled 2021-06-03: qty 2

## 2021-06-03 MED ORDER — FAMOTIDINE 20 MG PO TABS
20.0000 mg | ORAL_TABLET | Freq: Two times a day (BID) | ORAL | Status: DC
  Administered 2021-06-03 – 2021-06-05 (×4): 20 mg via ORAL
  Filled 2021-06-03 (×4): qty 1

## 2021-06-03 MED ORDER — HYDROCHLOROTHIAZIDE 10 MG/ML ORAL SUSPENSION: 6.2500 mg | Freq: Every day | ORAL | Status: DC

## 2021-06-03 MED ORDER — HYDROCHLOROTHIAZIDE 12.5 MG PO TABS
6.2500 mg | ORAL_TABLET | Freq: Every day | ORAL | Status: DC
  Filled 2021-06-03 (×2): qty 1

## 2021-06-03 MED ORDER — IRBESARTAN 75 MG PO TABS
300.0000 mg | ORAL_TABLET | Freq: Every day | ORAL | Status: DC
  Filled 2021-06-03: qty 4

## 2021-06-03 MED ORDER — HYDROXYZINE HCL 25 MG PO TABS
25.0000 mg | ORAL_TABLET | Freq: Three times a day (TID) | ORAL | Status: DC | PRN
  Administered 2021-06-04 – 2021-06-05 (×2): 25 mg via ORAL
  Filled 2021-06-03 (×2): qty 1

## 2021-06-03 MED ORDER — OXYCODONE HCL 5 MG PO TABS
5.0000 mg | ORAL_TABLET | Freq: Four times a day (QID) | ORAL | Status: DC | PRN
Start: 2021-06-03 — End: 2021-06-05
  Administered 2021-06-03: 5 mg via ORAL
  Filled 2021-06-03 (×2): qty 1

## 2021-06-03 MED ORDER — OXYCODONE-ACETAMINOPHEN 10-325 MG PO TABS: 1.0000 | ORAL_TABLET | Freq: Four times a day (QID) | ORAL | Status: DC | PRN

## 2021-06-03 MED ORDER — AMLODIPINE BESYLATE 5 MG PO TABS
10.0000 mg | ORAL_TABLET | Freq: Every day | ORAL | Status: DC
  Administered 2021-06-03: 10 mg via ORAL
  Filled 2021-06-03: qty 2

## 2021-06-03 MED ORDER — LORAZEPAM 0.5 MG PO TABS
0.5000 mg | ORAL_TABLET | Freq: Three times a day (TID) | ORAL | Status: DC
  Administered 2021-06-03: 0.5 mg via ORAL
  Filled 2021-06-03 (×2): qty 1

## 2021-06-03 MED ORDER — ACETAMINOPHEN 325 MG PO TABS: 650.0000 mg | ORAL_TABLET | Freq: Four times a day (QID) | ORAL | Status: DC | PRN

## 2021-06-03 MED ORDER — ADULT MULTIVITAMIN W/MINERALS CH
1.0000 | ORAL_TABLET | Freq: Every day | ORAL | Status: DC
  Administered 2021-06-04 – 2021-06-05 (×2): 1 via ORAL
  Filled 2021-06-03 (×2): qty 1

## 2021-06-03 MED ORDER — MAGNESIUM HYDROXIDE 400 MG/5ML PO SUSP: 30.0000 mL | Freq: Every day | ORAL | Status: DC | PRN

## 2021-06-03 NOTE — ED Notes (Signed)
Meal given

## 2021-06-03 NOTE — ED Notes (Addendum)
Message received from [place], [facility] regarding Trintellix stating, "would advise holding this medication tonight if it is unavailable. It won't have any short term affect anyway."

## 2021-06-03 NOTE — ED Provider Notes (Signed)
6:25 PM-reviewing EMR, it appears that there is no stable for medical clearance yet.  At this time patient appears medically cleared.  He has been started on blood pressure medicine and usual medicine.  Vital signs are normal currently.  Reviewed medical clearance evaluation and it is reassuring.  Patient appears medically cleared for treatment by psychiatry.  They have already seen him and plan on observing him overnight with reassessment in the morning.  We will initiate psychiatry holding orders.   Daleen Bo, MD 06/03/21 [phone]

## 2021-06-03 NOTE — ED Triage Notes (Signed)
Pt feels like he is having a nervous breakdown. He has tried ativan w/o relief. He was started on trintellix 4 weeks ago and reports he cannot stop w/o making anxiety attacks worse.

## 2021-06-03 NOTE — ED Notes (Signed)
Pt expresses concerns that Ativan 0.'5mg'$  is not the usual dose he has been taking. Pt states he has been taking Ativan 1.'5mg'$ . Pt states if he is unable to get Ativan 1.'5mg'$  at [facility], he would like to leave. Baldo Ash, RN present during conversation. RNs made Margorie John, [place] aware of pt's concerns. PA on to unit to speak with pt regarding his medications. PA states he will order a one time dose of Ativan '1mg'$ .

## 2021-06-03 NOTE — ED Notes (Signed)
RN verified Oxycodone combination order with Margorie John, PA. PA called [facility] pharmacy to discuss and confirm that Percocet 5-'325mg'$  and Oxy IR '5mg'$  are supposed to be given together.

## 2021-06-03 NOTE — ED Notes (Addendum)
Pt admitted to [facility] due to SI and anxiety. Pt A&O x4, calm and cooperative but reports feeling anxious. Pt tolerated skin assessment and admission process well. Pt oriented to unit and staff. Pt given sandwich, salad, and juice per his request. No signs of acute distress noted. Will continue to monitor for safety.

## 2021-06-03 NOTE — ED Provider Notes (Signed)
[facility] DEPT Provider Note   CSN: [id number] Arrival date & time: 06/03/21  [id number]     History Chief Complaint  Patient presents with   Headache   Anxiety    [name] Mabra. is a 60 y.o. male.  [name] Bee Sr. has a longstanding history of anxiety.  He presents stating that he is in a dark place.  He has been increasingly anxious since starting Trintellix.  He has been on multiple medications for anxiety and is under the care of psychiatry.  He has also had a generalized headache for several days.  He feels that he has been crawling out of his skin.  He has been sweating profusely and feels like he cannot focus.  He also states that he has occasional passive suicidal feelings but will not act on them.  He has never been an inpatient.  He does not currently see a counselor or therapist.  The history is provided by the patient.  Anxiety This is a chronic problem. Episode onset: Anxiety x 30 years but worse since starting Trintillex 6 months ago. The problem occurs constantly. The problem has been gradually worsening. Associated symptoms include chest pain (at times), headaches and shortness of breath (with exertion at times). Pertinent negatives include no abdominal pain. Nothing aggravates the symptoms. Nothing relieves the symptoms. Treatments tried: supposed to be on ativan 0.5 mg but has been taking 1 mg.      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   left   Erythrocytosis 11/30/2014   Essential hypertension    GERD (gastroesophageal reflux disease)    Headache(784.0)    2 x/week   History of kidney stones    Hypothyroidism    hx of    Leukocytosis, unspecified 11/03/2013   Limited joint range of motion    cervical fusion 02/2013   Medial meniscus tear 06/2013   left knee   Pheochromocytoma, right, 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 06/27/2017    Throat and mouth symptom    itchy throat x 3 weeks   Thyroid nodule    Tobacco abuse    Wears partial dentures    upper    Patient Active Problem List   Diagnosis Date Noted   Lumbar radiculopathy 01/11/2020   PVC (premature ventricular contraction)    Anxiety state 08/14/2017   Hypomagnesemia 06/28/2017   Hypothyroidism    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   Protein-calorie malnutrition, severe ([place]) 06/27/2017   Pheochromocytoma, right, 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 06/27/2017   Pressure injury of skin 05/29/2017   Ileus (HCC)    Hypokalemia 05/14/2017   Anastomotic leak of intestine 05/14/2017   Wound dehiscence, surgical 05/14/2017   Aspiration pneumonia ([place]) 05/14/2017   Chronic narcotic use 05/10/2017   Generalized anxiety disorder 05/10/2017   Intra-abdominal adhesions s/p SB resection 05/06/2017 05/09/2017   Throat symptom 03/18/2016   Multinodular goiter 01/19/2016   Hyperthyroidism 01/19/2016   Essential hypertension    Diarrhea 11/30/2014   Anemia, iron deficiency 11/01/2014   Leukocytosis 11/03/2013   Tobacco abuse 07/26/2013   COPD (chronic obstructive pulmonary disease) ([place]) 07/26/2013   Left knee pain 07/26/2013   Pain in joint, shoulder region 05/03/2013   GERD 07/24/2008   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    Past Surgical History:  Procedure Laterality Date  ABDOMINAL SURGERY     ANTERIOR CERVICAL DECOMP/DISCECTOMY FUSION N/A 03/18/2013   Procedure: ANTERIOR CERVICAL DECOMPRESSION/DISCECTOMY FUSION 3 LEVELS;  Surgeon: Erline Levine, MD;  Location: [facility];  Service: Neurosurgery;  Laterality: N/A;  Anterior Cervical Three-Six  Decompression/Diskectomy/Fusion   APPENDECTOMY     CIRCUMCISION  05/04/2006   COLONOSCOPY     CYSTOSCOPY/URETEROSCOPY/HOLMIUM LASER/STENT PLACEMENT Left 11/24/2018   Procedure: CYSTOSCOPY/URETEROSCOPY/HOLMIUM LASER/STENT PLACEMENT;  Surgeon: Lucas Mallow, MD;   Location: [facility];  Service: Urology;  Laterality: Left;   DECOMPRESSIVE LUMBAR LAMINECTOMY LEVEL 1 Right 01/11/2020   Procedure: RIGHT LUMBAR FOUR-FIVE MINIMALLY INVASIVE LUMBAR DISCECTOMY;  Surgeon: Judith Part, MD;  Location: [facility];  Service: Neurosurgery;  Laterality: Right;   EYE SURGERY     bilateral cataract surgery    HEMICOLECTOMY Right [id number]   with ileocolic anastomosis   INGUINAL HERNIA REPAIR     INSERTION OF MESH N/A 07/31/2017   Procedure: INSERTION OF MESH;  Surgeon: Johnathan Hausen, MD;  Location: [facility];  Service: General;  Laterality: N/A;   KNEE ARTHROSCOPY Left 08/01/2013   Procedure: LEFT ARTHROSCOPY KNEE;  Surgeon: Kerin Salen, MD;  Location: [facility];  Service: Orthopedics;  Laterality: Left;   LAPAROSCOPIC ADRENALECTOMY Right 05/06/2017   Procedure: OPEN RIGHT ADRENALECTOMY WITH SMALL BOWEL RESECTION AND ANASTOMOSIS;  Surgeon: Kieth Brightly, Arta Bruce, MD;  Location: [facility];  Service: General;  Laterality: Right;   LAPAROSCOPIC LYSIS OF ADHESIONS N/A 07/31/2017   Procedure: LYSIS OF ADHESIONS;  Surgeon: Johnathan Hausen, MD;  Location[facility];  Service: General;  Laterality: N/A;   LAPAROSCOPY N/A 07/31/2017   Procedure: LAPAROSCOPY DIAGNOSTIC;  Surgeon: Johnathan Hausen, MD;  Location: [facility];  Service: General;  Laterality: N/A;   LAPAROTOMY N/A 05/13/2017   Procedure: EXPLORATORY LAPAROTOMY, with wound dehisense, and repair of anastamotic leak;  Surgeon: [name], Arta Bruce, MD;  Location[facility];  Service: General;  Laterality: N/A;   LAPAROTOMY N/A 05/14/2017   Procedure: EXPLORATORY LAPAROTOMY, LYSIS OF ADHESIONS/ SMALL BOWEL RESECTION/ WASHOUT OF ABDOMEN AND PLACEMENT OF NEGATIVE PRESSURE DRESSING;  Surgeon: [name], Arta Bruce, MD;  Location: [facility];  Service: General;  Laterality: N/A;   LAPAROTOMY N/A 05/17/2017   Procedure: EXPLORATORY LAPAROTOMY, PLACEMENT OF ILEOSTOMY TUBES, PARTIAL CLOSURE OF FASCIA, WOUND VAC  EXCHANGE;  Surgeon: [name], Arta Bruce, MD;  Location: [facility];  Service: General;  Laterality: N/A;   LAPAROTOMY N/A 05/20/2017   Procedure: ABDOMINAL WOUND [place] OUT, WOUND CLOSURE AND WOUND VAC PLACEMENT;  Surgeon: [name], Arta Bruce, MD;  Location[facility];  Service: General;  Laterality: N/A;   LAPAROTOMY N/A 07/31/2017   Procedure: EXPLORATORY LAPAROTOMY WITH CLOSURE OF ENTEROTOMICS;  Surgeon: Johnathan Hausen, MD;  Location[facility];  Service: General;  Laterality: N/A;   SHOULDER ARTHROSCOPY W/ ROTATOR CUFF REPAIR Left 07/16/2010   SMALL INTESTINE SURGERY  04/24/2000   adhesiolysis, ileocolic anastomosis   UPPER GASTROINTESTINAL ENDOSCOPY         Family History  Problem Relation Age of Onset   Colon cancer Father 43   Breast cancer Sister 19   Thyroid disease Neg Hx     Social History   Tobacco Use   Smoking status: Former    Packs/day: 1.00    Years: 25.00    Pack years: 25.00    Types: Cigarettes    Quit date: 2017    Years since quitting: 5.6   Smokeless tobacco: Never   Tobacco comments:    quit  smoking 3 years ago  Vaping Use   Vaping Use: Never used  Substance Use Topics   Alcohol use: Yes    Comment: occasionally   Drug use: No    Home Medications Prior to Admission medications   Medication Sig Start Date End Date Taking? Authorizing Provider  amLODipine (NORVASC) 10 MG tablet Take 10 mg by mouth daily.  03/11/19   [provider]  betamethasone dipropionate 0.05 % cream Apply 1 application topically 2 (two) times daily. 01/04/20   [provider]  cyclobenzaprine (FLEXERIL) 10 MG tablet Take 0.5-1 tablets (5-10 mg total) by mouth 3 (three) times daily as needed for muscle spasms. 01/13/20   Judith Part, MD  famotidine (PEPCID) 40 MG tablet Take 40 mg by mouth daily.    [provider]  hydrochlorothiazide (HYDRODIURIL) 12.5 MG tablet Take 3.125 mg by mouth daily.  09/01/19   [provider]  ibuprofen (ADVIL) 200 MG  tablet Take 1,200 mg by mouth every 6 (six) hours as needed for moderate pain.    [provider]  LORazepam (ATIVAN) 0.5 MG tablet Take 1 tablet (0.5 mg total) by mouth 2 (two) times daily. 04/30/21   Arfeen, Arlyce Harman, MD  Multiple Vitamin (MULTIVITAMIN WITH MINERALS) TABS Take 1 tablet by mouth daily.    [provider]  oxyCODONE-acetaminophen (PERCOCET) 10-325 MG tablet Take 1 tablet by mouth 4 (four) times daily.    [provider]  Probiotic Product (PROBIOTIC PO) Take 1 capsule by mouth daily.    [provider]  sildenafil (VIAGRA) 100 MG tablet Take 100 mg by mouth daily as needed for erectile dysfunction.    [provider]  telmisartan (MICARDIS) 80 MG tablet Take 80 mg by mouth at bedtime.    [provider]  traZODone (DESYREL) 100 MG tablet Take 75 mg by mouth at bedtime.    [provider]  vortioxetine HBr (TRINTELLIX) 10 MG TABS tablet Take 2 10 mg tablets ('20mg'$  total) once daily. 08/21/20   Arfeen, Arlyce Harman, MD  simethicone (MYLICON) 80 MG chewable tablet Chew 80 mg by mouth every 6 (six) hours as needed for flatulence.  02/01/14  [provider]    Allergies    Chantix [varenicline], Morphine, and Penicillins  Review of Systems   Review of Systems  Constitutional:  Negative for chills and fever.  HENT:  Negative for ear pain and sore throat.   Eyes:  Negative for pain and visual disturbance.  Respiratory:  Positive for shortness of breath (with exertion at times). Negative for cough.   Cardiovascular:  Positive for chest pain (at times). Negative for palpitations.  Gastrointestinal:  Negative for abdominal pain and vomiting.  Genitourinary:  Negative for dysuria and hematuria.  Musculoskeletal:  Negative for arthralgias and back pain.  Skin:  Negative for color change and rash.  Neurological:  Positive for headaches. Negative for seizures and syncope.  Psychiatric/Behavioral:  Positive for suicidal ideas (no  plan, passive). The patient is nervous/anxious.   All other systems reviewed and are negative.  Physical Exam Updated Vital Signs BP (!) 208/72   Pulse 70   Temp 98 F (36.7 C) (Oral)   Resp 19   SpO2 93%   Physical Exam Vitals and nursing note reviewed.  Constitutional:      Appearance: Normal appearance.  HENT:     Head: Normocephalic and atraumatic.  Cardiovascular:     Rate and Rhythm: Normal rate and regular rhythm.  Heart sounds: Normal heart sounds.  Pulmonary:     Effort: Pulmonary effort is normal.     Breath sounds: Normal breath sounds.  Abdominal:     General: There is no distension.     Tenderness: There is no abdominal tenderness. There is no guarding.  Musculoskeletal:     Cervical back: Normal range of motion.     Right lower leg: No edema.     Left lower leg: No edema.  Skin:    General: Skin is warm and dry.  Neurological:     General: No focal deficit present.     Mental Status: He is alert and oriented to person, place, and time.     Cranial Nerves: No cranial nerve deficit, dysarthria or facial asymmetry.     Sensory: No sensory deficit.     Motor: No weakness.  Psychiatric:        Mood and Affect: Mood normal.        Behavior: Behavior normal.    ED Results / Procedures / Treatments   Labs (all labs ordered are listed, but only abnormal results are displayed) Labs Reviewed  RESP PANEL BY RT-PCR (FLU A&B, COVID) [id number]  COMPREHENSIVE METABOLIC PANEL  ETHANOL  RAPID URINE DRUG SCREEN, HOSP PERFORMED  CBC WITH DIFFERENTIAL/PLATELET    EKG None  Radiology No results found.  Procedures Procedures   Medications Ordered in ED Medications  diazepam (VALIUM) tablet 5 mg (has no administration in time range)    ED Course  I have reviewed the triage vital signs and the nursing notes.  Pertinent labs & imaging results that were available during my care of the patient were reviewed by me and considered in my medical decision making  (see chart for details).    MDM Rules/Calculators/A&P                           [name] Bee Sr. presents to the ED complaining of acute on chronic anxiety as well as a headache.  He will be evaluated for any underlying medical cause or contributor to the symptoms.  However, given his longstanding history of anxiety, I think these are likely representative of an acute worsening of his longstanding anxiety.  TTS consult is pending once he is medically clear. Final Clinical Impression(s) / ED Diagnoses Final diagnoses:  Anxiety  Nonintractable headache, unspecified chronicity pattern, unspecified headache type    Rx / DC Orders ED Discharge Orders     None        Arnaldo Natal, MD 06/04/21 [phone]

## 2021-06-03 NOTE — ED Notes (Signed)
Pt asleep in bed. Respirations even and unlabored. Will continue to monitor for safety. ?

## 2021-06-03 NOTE — ED Notes (Addendum)
Pt is requesting to take Trintellix tonight. Margorie John, PA aware. Message sent to [place] requesting Trintellix be delivered due to medication not in [facility] pyxis.
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
## 2021-06-03 NOTE — ED Notes (Signed)
Trintellix arrived from [place] via [place]. Medication labeled with pt sticker and put in pt's drawer in med room. Pt states he "wants to try to go without it tonight" because he believes medication is making him anxious and have SI. Margorie John, PA made aware.

## 2021-06-03 NOTE — ED Provider Notes (Addendum)
Behavioral Health Admission H&P [facility] & OBS)  Date: 06/03/21 Patient Name: [name] Adornetto Sr. MRN: [id number] Chief Complaint:  Chief Complaint  Patient presents with   urgent emergent eval      Diagnoses:  Final diagnoses:  GAD (generalized anxiety disorder)  Panic attacks    HPI: [name] Bee Sr. Is a 60 year old male with past medical history significant for GERD and hypertension, as well as past psychiatric history significant for GAD and panic attacks, who presents to [facility] as a voluntary direct admit [place] transfer from [facility] emergency department.  Upon my arrival to [facility], patient was already at the [facility] facility waiting to be seen by a provider.  Per chart review, patient presented to [facility] emergency department on 06/03/21.  Patient was evaluated by TTS on 06/03/2021 while patient was in the emergency department and it is documented in 06/03/21 TTS assessment that overnight observation with reevaluation by psychiatry was recommended by [facility] Provider, Pecolia Ades, NP.  However, there was no documentation that patient was recommended for transfer to [facility] for [place].  The only documentation regarding patient's transfer was documentation by emergency department RN on 06/03/2021 at 1806 stating that safe transport was called.  I spoke with Pecolia Ades, NP via phone, who confirmed that it was recommended for the patient to be transferred from the ED to [facility] for admission to the [facility] unit.  Patient is seen and examined by myself upon my arrival to [facility].  Patient states that he went to the emergency department earlier today for "anxiety, panic attacks, and headaches".  Patient states that the evening of 06/02/2021, he had a panic attack that lasted for about 30 to 40 minutes.  Patient denies any triggers associated with this panic attack.  Patient endorses having panic attacks at least once daily for the past few years now.  He states that his panic attacks are accompanied by the following physical  symptoms of sharp/stabbing chest pain that occasionally radiates down his left arm, diaphoresis of his hands and feet, and tremor.  Patient denies any additional physical symptoms associated with his panic attacks and he denies experiencing any of the noted above physical symptoms currently on exam.  Patient denies history of seizures.  Patient states that he is struggling with anxiety since his 65s.  Patient denies SI currently on exam.  However, it is documented in 06/03/21 TTS evaluation that patient was reporting SI with plan to use a gun and it is also documented in this TTS evaluation and patient stated his Trintellix was causing him to have these suicidal thoughts.  Patient sees Dr. Adele Schilder for psychotropic medication management.  Per chart review, it appears that patient's last visit with Dr. Adele Schilder was on 04/18/2021.  Per chart review of the 04/18/2021 visit, it appears that patient was taking Trintellix 10 mg daily as well as Ativan 0.5 mg twice daily at that time.  Patient is very contradictory and a poor historian regarding his medications.  Patient states that he recently increased his Trintellix to 15 mg daily on his own over the past week, but then states that the Trintellix makes his anxiety and panic attacks worse (it is also documented in 06/03/21 TTS evaluation that patient was reporting SI with plan to use a gun and it is also documented in this TTS evaluation and patient stated his Trintellix was causing him to have these suicidal thoughts).  After stating this, patient then states that he does not want to take Trintellix  anymore, but then states that he does want to continue to take Trintellix because he does not want to withdraw from it and he is fearful that he will have seizures if he does not take his Trintellix.  Patient also states that he has recently increased his Ativan dosage on his own from 0.5 mg twice daily to 0.5 mg 3 times daily over the past few weeks. PDMP review shows patient  recently filled prescription for 60 0.5 mg Ativan tablets on 05/30/2021.  Patient is also currently taking trazodone 75 mg at bedtime for sleep, which is prescribed by his PCP.  Patient states that he does not have another appointment with Dr. Adele Schilder scheduled at this time.  He states that his next appointment with Dr. Adele Schilder is in September 2022.  He states that he has tried to schedule a sooner psychiatry appointment but has been unsuccessful.  Patient does not have a therapist at this time.  He states that he last saw a therapist named Dr. Lutricia Feil through [facility] about 1 year ago but does not provide details regarding why he stopped seeing his therapist.  Patient denies taking any additional psychotropic medications at this time.  Patient denies history of any past suicide attempts.  He denies history of self-injurious behavior via intentionally cutting or burning himself.  He denies HI, AVH, paranoia, or delusions.  Patient reports that he has been sleeping poorly, about 4 to 5 hours per night.  He endorses anhedonia.  When asked about feelings of guilt/hopelessness/worthlessness.  Patient states "not exactly" and does not provide further details.  He endorses fatigue but denies concentration changes.  He reports poor appetite but denies weight changes.  He denies history of being psychiatrically hospitalized in the past.  He reports that he lives in [place] with his wife and 76 year old son.  Patient states that there is one firearm in his home, but he states that this firearm is locked up.  He denies having any thoughts about wanting to use this firearm on himself or others (this contradicts what is documented in 06/03/21 TTS evaluation- see above paragraph for details).  Patient states that he is currently on disability for back and neck issues from when he had neck and back surgery about 8 years ago.  Patient reports having intermittent headaches since he had these procedures done to his neck and  back 8 years ago.  Patient states that he follows up with pain management every 3 months and takes oxycodone every 6 hours as needed for neck and back pain.  PDMP review shows that patient most recently had prescription filled for 135 oxycodone-acetaminophen 10-325 mg tablets on 05/30/2021.  Patient denies alcohol, tobacco, or illicit drug use.  PHQ 2-9:   Flowsheet Row ED from 06/03/2021 in [place] [facility] Video Visit from 04/18/2021 in [facility] Video Visit from 02/18/2021 in [facility]  C-SSRS RISK CATEGORY Low Risk No Risk No Risk        Total Time spent with patient: 30 minutes  Musculoskeletal  Strength & Muscle Tone: within normal limits Gait & Station: normal Patient leans: N/A  Psychiatric Specialty Exam  Presentation General Appearance: Appropriate for Environment; Well Groomed  Eye Contact:Good  Speech:Clear and Coherent; Normal Rate  Speech Volume:Normal  Handedness:No data recorded  Mood and Affect  Mood:Anxious  Affect:Congruent   Thought Process  Thought Processes:Coherent; Goal Directed; Linear  Descriptions of Associations:Intact  Orientation:Full (Time, Place and Person)  Thought Content:Logical; WDL  Diagnosis of Schizophrenia or Schizoaffective disorder in past: No   Hallucinations:Hallucinations: None  Ideas of Reference:None  Suicidal Thoughts:Suicidal Thoughts: No  Homicidal Thoughts:Homicidal Thoughts: No   Sensorium  Memory:Immediate Good; Recent Good; Remote Good  Judgment:Good  Insight:Good   Executive Functions  Concentration:Fair  Attention Span:Fair  [place] of Knowledge:Good  Language:Good   Psychomotor Activity  Psychomotor Activity:Psychomotor Activity: Restlessness   Assets  Assets:Communication Skills; Desire for Improvement; Financial Resources/Insurance; Housing; Leisure Time; Physical  Health   Sleep  Sleep:Sleep: Poor Number of Hours of Sleep: 4   Nutritional Assessment (For OBS and FBC admissions only) Has the patient had a weight loss or gain of 10 pounds or more in the last 3 months?: No Has the patient had a decrease in food intake/or appetite?: Yes Does the patient have dental problems?: No Does the patient have eating habits or behaviors that may be indicators of an eating disorder including binging or inducing vomiting?: No Has the patient recently lost weight without trying?: No Has the patient been eating poorly because of a decreased appetite?: Yes Malnutrition Screening Tool Score: 1    Physical Exam Vitals reviewed.  Constitutional:      General: He is not in acute distress.    Appearance: Normal appearance. He is not ill-appearing, toxic-appearing or diaphoretic.  HENT:     Head: Normocephalic and atraumatic.     Right Ear: External ear normal.     Left Ear: External ear normal.     Nose: Nose normal.  Eyes:     General:        Right eye: No discharge.        Left eye: No discharge.     Conjunctiva/sclera: Conjunctivae normal.  Cardiovascular:     Rate and Rhythm: Tachycardia present.  Pulmonary:     Effort: Pulmonary effort is normal. No respiratory distress.  Musculoskeletal:        General: Normal range of motion.     Cervical back: Normal range of motion.  Neurological:     General: No focal deficit present.     Mental Status: He is alert and oriented to person, place, and time.     Comments: No tremor noted.   Psychiatric:        Attention and Perception: Attention and perception normal. He does not perceive auditory or visual hallucinations.        Mood and Affect: Mood is anxious.        Speech: Speech normal.        Behavior: Behavior is not agitated, slowed, aggressive, withdrawn, hyperactive or combative. Behavior is cooperative.        Thought Content: Thought content is not paranoid or delusional. Thought content does not  include homicidal ideation.     Comments: Affect mood congruent. Patient denies SI currently on exam.  However, it is documented in 06/03/21 TTS evaluation that patient was reporting SI with plan to use a gun and it is also documented in this TTS evaluation and patient stated his Trintellix was causing him to have these suicidal thoughts.  Patient endorses having panic attacks at least once daily for the past few years now.  He states that his panic attacks are accompanied by the following physical symptoms of sharp/stabbing chest pain that occasionally radiates down his left arm, diaphoresis of his hands and feet, and tremor.  Patient denies any additional physical symptoms associated with his panic attacks and he denies experiencing any  of the noted above physical symptoms currently on exam.  Review of Systems  Constitutional:  Positive for malaise/fatigue. Negative for chills, diaphoresis, fever and weight loss.  HENT:  Negative for congestion.   Respiratory:  Negative for cough and shortness of breath.   Cardiovascular:  Negative for chest pain and palpitations.  Gastrointestinal:  Negative for abdominal pain, constipation, diarrhea, nausea and vomiting.  Musculoskeletal:  Positive for back pain, joint pain and neck pain. Negative for myalgias.  Neurological:  Negative for dizziness, tremors, seizures and headaches.  Psychiatric/Behavioral:  Positive for depression. Negative for hallucinations, memory loss and substance abuse. The patient is nervous/anxious and has insomnia.   All other systems reviewed and are negative.  Vitals: Blood pressure (!) 156/92, pulse (!) 113, temperature 98.2 F (36.8 C), temperature source Oral, resp. rate 16, SpO2 97 %. There is no height or weight on file to calculate BMI.  Past Psychiatric History: GAD, Panic Attacks    Is the patient at risk to self? Yes  Has the patient been a risk to self in the past 6 months? No .    Has the patient been a risk to self  within the distant past? No   Is the patient a risk to others? No   Has the patient been a risk to others in the past 6 months? No   Has the patient been a risk to others within the distant past? 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 06/27/2017   Throat and mouth symptom    itchy throat x 3 weeks   Thyroid nodule    Tobacco abuse    Wears partial dentures    upper    Past Surgical History:  Procedure Laterality Date   ABDOMINAL SURGERY     ANTERIOR CERVICAL DECOMP/DISCECTOMY FUSION N/A 03/18/2013   Procedure: ANTERIOR CERVICAL DECOMPRESSION/DISCECTOMY FUSION 3 LEVELS;  Surgeon: Erline Levine, MD;  Location: [facility];  Service: Neurosurgery;  Laterality: N/A;  Anterior Cervical Three-Six  Decompression/Diskectomy/Fusion   APPENDECTOMY     CIRCUMCISION  05/04/2006   COLONOSCOPY     CYSTOSCOPY/URETEROSCOPY/HOLMIUM LASER/STENT PLACEMENT Left 11/24/2018   Procedure: CYSTOSCOPY/URETEROSCOPY/HOLMIUM LASER/STENT PLACEMENT;  Surgeon: Lucas Mallow, MD;  Location: [facility];  Service: Urology;  Laterality: Left;   DECOMPRESSIVE LUMBAR LAMINECTOMY LEVEL 1 Right 01/11/2020   Procedure: RIGHT LUMBAR FOUR-FIVE MINIMALLY INVASIVE LUMBAR DISCECTOMY;  Surgeon: Judith Part, MD;  Location: [facility];  Service: Neurosurgery;  Laterality: Right;   EYE SURGERY     bilateral cataract surgery    HEMICOLECTOMY Right [id number]   with ileocolic anastomosis   INGUINAL  HERNIA REPAIR     INSERTION OF MESH N/A 07/31/2017   Procedure: INSERTION OF MESH;  Surgeon: Johnathan Hausen, MD;  Location: [facility];  Service: General;  Laterality: N/A;   KNEE ARTHROSCOPY Left 08/01/2013  Procedure: LEFT ARTHROSCOPY KNEE;  Surgeon: Kerin Salen, MD;  Location: [facility];  Service: Orthopedics;  Laterality: Left;   LAPAROSCOPIC ADRENALECTOMY Right 05/06/2017   Procedure: OPEN RIGHT ADRENALECTOMY WITH SMALL BOWEL RESECTION AND ANASTOMOSIS;  Surgeon: Kieth Brightly, Arta Bruce, MD;  Location: [facility];  Service: General;  Laterality: Right;   LAPAROSCOPIC LYSIS OF ADHESIONS N/A 07/31/2017   Procedure: LYSIS OF ADHESIONS;  Surgeon: Johnathan Hausen, MD;  Location: [facility];  Service: General;  Laterality: N/A;   LAPAROSCOPY N/A 07/31/2017   Procedure: LAPAROSCOPY DIAGNOSTIC;  Surgeon: Johnathan Hausen, MD;  Location: [facility];  Service: General;  Laterality: N/A;   LAPAROTOMY N/A 05/13/2017   Procedure: EXPLORATORY LAPAROTOMY, with wound dehisense, and repair of anastamotic leak;  Surgeon: [name], Arta Bruce, MD;  Location: [facility];  Service: General;  Laterality: N/A;   LAPAROTOMY N/A 05/14/2017   Procedure: EXPLORATORY LAPAROTOMY, LYSIS OF ADHESIONS/ SMALL BOWEL RESECTION/ WASHOUT OF ABDOMEN AND PLACEMENT OF NEGATIVE PRESSURE DRESSING;  Surgeon: [name], Arta Bruce, MD;  Location: [facility];  Service: General;  Laterality: N/A;   LAPAROTOMY N/A 05/17/2017   Procedure: EXPLORATORY LAPAROTOMY, PLACEMENT OF ILEOSTOMY TUBES, PARTIAL CLOSURE OF FASCIA, WOUND VAC EXCHANGE;  Surgeon: [name], Arta Bruce, MD;  Location: [facility];  Service: General;  Laterality: N/A;   LAPAROTOMY N/A 05/20/2017   Procedure: ABDOMINAL WOUND [place] OUT, WOUND CLOSURE AND WOUND [place];  Surgeon: [name], Arta Bruce, MD;  Location[facility];  Service: General;  Laterality: N/A;   LAPAROTOMY N/A 07/31/2017   Procedure: EXPLORATORY LAPAROTOMY WITH CLOSURE OF ENTEROTOMICS;  Surgeon: Johnathan Hausen, MD;   Location[facility];  Service: General;  Laterality: N/A;   SHOULDER ARTHROSCOPY W/ ROTATOR CUFF REPAIR Left 07/16/2010   SMALL INTESTINE SURGERY  04/24/2000   adhesiolysis, ileocolic anastomosis   UPPER GASTROINTESTINAL ENDOSCOPY      Family History:  Family History  Problem Relation Age of Onset   Colon cancer Father 72   Breast cancer Sister 58   Thyroid disease Neg Hx     Social History:  Social History   Socioeconomic History   Marital status: Married    Spouse name: Not on file   Number of children: Not on file   Years of education: Not on file   Highest education level: Not on file  Occupational History   Not on file  Tobacco Use   Smoking status: Former    Packs/day: 1.00    Years: 25.00    Pack years: 25.00    Types: Cigarettes    Quit date: 2017    Years since quitting: 5.6   Smokeless tobacco: Never   Tobacco comments:    quit smoking 3 years ago  Vaping Use   Vaping Use: Never used  Substance and Sexual Activity   Alcohol use: Yes    Comment: occasionally   Drug use: No   Sexual activity: Not on file  Other Topics Concern   Not on file  Social History Narrative   Lives in [place] with his wife.   Social Determinants of Health   Financial Resource Strain: Not on file  Food Insecurity: Not on file  Transportation Needs: Not on file  Physical Activity: Not on file  Stress: Not on file  Social Connections: Not on file  Intimate Partner Violence: Not on file    SDOH:  SDOH Screenings   Alcohol Screen: Not on file  Depression [id number]): Not on file  Financial Resource Strain: Not on file  Food  Insecurity: Not on file  Housing: Not on file  Physical Activity: Not on file  Social Connections: Not on file  Stress: Not on file  Tobacco Use: Medium Risk   Smoking Tobacco Use: Former   Smokeless Tobacco Use: Never  Transportation Needs: Not on file    Last Labs:  Admission on 06/03/2021, Discharged on 06/03/2021  Component Date Value Ref Range  Status   SARS Coronavirus 2 by RT PCR 06/03/2021 NEGATIVE  NEGATIVE Final   Comment: (NOTE) SARS-CoV-2 target nucleic acids are NOT DETECTED.  The SARS-CoV-2 RNA is generally detectable in upper respiratory specimens during the acute phase of infection. The lowest concentration of SARS-CoV-2 viral copies this assay can detect is 138 copies/mL. A negative result does not preclude SARS-Cov-2 infection and should not be used as the sole basis for treatment or other patient management decisions. A negative result may occur with  improper specimen collection/handling, submission of specimen other than nasopharyngeal swab, presence of viral mutation(s) within the areas targeted by this assay, and inadequate number of viral copies(<138 copies/mL). A negative result must be combined with clinical observations, patient history, and epidemiological information. The expected result is Negative.  Fact Sheet for Patients:  EntrepreneurPulse.com.au  Fact Sheet for Healthcare Providers:  IncredibleEmployment.be  This test is no                          t yet approved or cleared by the [place] FDA and  has been authorized for detection and/or diagnosis of SARS-CoV-2 by FDA under an Emergency Use Authorization (EUA). This EUA will remain  in effect (meaning this test can be used) for the duration of the COVID-19 declaration under Section 564(b)(1) of the Act, 21 U.S.C.section 360bbb-3(b)(1), unless the authorization is terminated  or revoked sooner.       Influenza A by PCR 06/03/2021 NEGATIVE  NEGATIVE Final   Influenza B by PCR 06/03/2021 NEGATIVE  NEGATIVE Final   Comment: (NOTE) The Xpert Xpress SARS-CoV-2/FLU/RSV plus assay is intended as an aid in the diagnosis of influenza from Nasopharyngeal swab specimens and should not be used as a sole basis for treatment. Nasal washings and aspirates are unacceptable for Xpert Xpress  SARS-CoV-2/FLU/RSV testing.  Fact Sheet for Patients: EntrepreneurPulse.com.au  Fact Sheet for Healthcare Providers: IncredibleEmployment.be  This test is not yet approved or cleared by the [place] FDA and has been authorized for detection and/or diagnosis of SARS-CoV-2 by FDA under an Emergency Use Authorization (EUA). This EUA will remain in effect (meaning this test can be used) for the duration of the COVID-19 declaration under Section 564(b)(1) of the Act, 21 U.S.C. section 360bbb-3(b)(1), unless the authorization is terminated or revoked.  Performed at [facility], [place] [address], [place], [place] [address]    Sodium 06/03/2021 139  135 - 145 mmol/L Final   Potassium 06/03/2021 3.8  3.5 - 5.1 mmol/L Final   Chloride 06/03/2021 106  98 - 111 mmol/L Final   CO2 06/03/2021 24  22 - 32 mmol/L Final   Glucose, Bld 06/03/2021 92  70 - 99 mg/dL Final   Glucose reference range applies only to samples taken after fasting for at least 8 hours.   BUN 06/03/2021 8  6 - 20 mg/dL Final   Creatinine, Ser 06/03/2021 0.76  0.61 - 1.24 mg/dL Final   Calcium 06/03/2021 9.3  8.9 - 10.3 mg/dL Final   Total Protein 06/03/2021 7.1  6.5 - 8.1  g/dL Final   Albumin 06/03/2021 4.2  3.5 - 5.0 g/dL Final   AST 06/03/2021 26  15 - 41 U/L Final   ALT 06/03/2021 30  0 - 44 U/L Final   Alkaline Phosphatase 06/03/2021 66  38 - 126 U/L Final   Total Bilirubin 06/03/2021 0.7  0.3 - 1.2 mg/dL Final   GFR, Estimated 06/03/2021 >60  >60 mL/min Final   Comment: (NOTE) Calculated using the CKD-EPI Creatinine Equation (2021)    Anion gap 06/03/2021 9  5 - 15 Final   Performed at [facility], [place] [address], [place], [place] [address]   Alcohol, Ethyl (B) 06/03/2021 <10  <10 mg/dL Final   Comment: (NOTE) Lowest detectable limit for serum alcohol is 10 mg/dL.  For medical purposes only. Performed at [facility], [place] [address] [place], [place] [address]    Opiates 06/03/2021 NONE DETECTED  NONE DETECTED Final   Cocaine 06/03/2021 NONE DETECTED  NONE DETECTED Final   Benzodiazepines 06/03/2021 NONE DETECTED  NONE DETECTED Final   Amphetamines 06/03/2021 NONE DETECTED  NONE DETECTED Final   Tetrahydrocannabinol 06/03/2021 NONE DETECTED  NONE DETECTED Final   Barbiturates 06/03/2021 NONE DETECTED  NONE DETECTED Final   Comment: (NOTE) DRUG SCREEN FOR MEDICAL PURPOSES ONLY.  IF CONFIRMATION IS NEEDED FOR ANY PURPOSE, NOTIFY LAB WITHIN 5 DAYS.  LOWEST DETECTABLE LIMITS FOR URINE DRUG SCREEN Drug Class                     Cutoff (ng/mL) Amphetamine and metabolites    1000 Barbiturate and metabolites    200 Benzodiazepine                 [id number] Tricyclics and metabolites     300 Opiates and metabolites        300 Cocaine and metabolites        300 THC                            50 Performed at [facility], [place] [address] [place], [place] [address]    WBC 06/03/2021 7.5  4.0 - 10.5 K/uL Final   RBC 06/03/2021 4.56  4.22 - 5.81 MIL/uL Final   Hemoglobin 06/03/2021 12.6 (A) 13.0 - 17.0 g/dL Final   HCT 06/03/2021 39.1  39.0 - 52.0 % Final   MCV 06/03/2021 85.7  80.0 - 100.0 fL Final   MCH 06/03/2021 27.6  26.0 - 34.0 pg Final   MCHC 06/03/2021 32.2  30.0 - 36.0 g/dL Final   RDW 06/03/2021 14.3  11.5 - 15.5 % Final   Platelets 06/03/2021 273  150 - 400 K/uL Final   nRBC 06/03/2021 0.0  0.0 - 0.2 % Final   Neutrophils Relative % 06/03/2021 60  % Final   Neutro Abs 06/03/2021 4.5  1.7 - 7.7 K/uL Final   Lymphocytes Relative 06/03/2021 32  % Final   Lymphs Abs 06/03/2021 2.4  0.7 - 4.0 K/uL Final   Monocytes Relative 06/03/2021 7  % Final   Monocytes Absolute 06/03/2021 0.5  0.1 - 1.0 K/uL Final   Eosinophils Relative 06/03/2021 1  % Final   Eosinophils Absolute 06/03/2021 0.1  0.0 - 0.5 K/uL Final   Basophils Relative 06/03/2021 0  % Final   Basophils Absolute  06/03/2021 0.0  0.0 - 0.1 K/uL Final   Immature Granulocytes 06/03/2021 0  % Final   Abs  Immature Granulocytes 06/03/2021 0.02  0.00 - 0.07 K/uL Final   Performed at [place] [address], [place], [place] [address]    Allergies: Chantix [varenicline], Morphine, and Penicillins  PTA Medications: (Not in a hospital admission)   Medical Decision Making  Patient was recommended by outpatient provider for transfer to [facility] from the ED for [facility] admission.  Based on patient's 06/03/21 TTS evaluation, which included documented SI with plan, as well as my personal evaluation of the patient, patient's SI, anxiety, and panic attacks appear to be severely negatively impacting the patient's ability to function and perform his activities of daily living and thus, patient meets criteria for admission to [facility].  We will admit the patient to the [facility] unit for crisis stabilization and treatment.    Recommendations  Based on my evaluation the patient does not appear to have an emergency medical condition. Patient was medically cleared in the emergency department. 06/03/21 ED Labs/Tests reviewed:  -PCR COVID: Negative  -CMP: Within normal limits  -Ethanol: Within normal limits  -CBC with differential: Hemoglobin slightly reduced at 12.6 g/dL, essentially normal.  CBC otherwise unremarkable.  -UDS: Negative for all substances  -EKG: No signs of acute ischemia with ventricular rate of 75 bpm, PR interval 146 ms, QRS 90 ms, QT/QTC 376/420 ms  Patient sees Dr. Adele Schilder for psychotropic medication management.  Per chart review, it appears that patient's last visit with Dr. Adele Schilder was on 04/18/2021.  Per chart review of the 04/18/2021 visit, it appears that patient was taking Trintellix 10 mg daily as well as Ativan 0.5 mg twice daily at that time. Patient states that he has recently increased his Ativan dosage on his own from 0.5 mg twice daily to 0.5 mg 3 times daily over the past few weeks.  PDMP review shows patient recently filled prescription for 60 0.5 mg Ativan tablets on 05/30/2021.   Will continue the following home medications:  -Ativan 0.5 mg p.o. 3 times daily for anxiety (see notes above regarding patient's Ativan usage).  Notified by nursing staff that patient was reporting to them that he had the wrong dose of Ativan ordered here.  After speaking with the patient again, patient states that he is not supposed to be taking 0.5 mg of Ativan.  Upon informing the patient that per my chart review of patient's last psychiatry visit, it was documented that patient was taking 0.5 mg of Ativan and also informing the patient that he had told me earlier that he was taking 0.5 mg 3 times daily, patient then stated that this was wrong and that he is actually supposed to be taking Ativan 1.5 mg 3 times daily.  I informed the patient that I was not comfortable with ordering Ativan 1.5 mg 3 times daily for the patient at this time.  I did tell the patient that I will order a one-time dose of Ativan 1 mg p.o. for the patient to take tonight and that I would leave his scheduled Ativan order for 0.5 mg 3 times daily at this time and patient verbalized understanding of this.  Recommend that dayshift treatment team discussed patient's Ativan dosing with the patient.  Patient scheduled Ativan order remains at 0.5 mg 3 times daily as needed at this time.  -In order to hopefully assist with potential future tapering of patient's Ativan dosing, I will initiate Vistaril 25 mg p.o. 3 times daily as needed for anxiety as well.  -Trintellix 10 mg p.o. daily for depression/anxiety:  Patient is very contradictory and a poor historian regarding his medications.  Patient states that he recently increased his Trintellix to 15 mg daily on his own over the past week, but then states that the Trintellix makes his anxiety and panic attacks worse (it is also documented in 06/03/21 TTS evaluation that patient was reporting SI with  plan to use a gun and it is also documented in this TTS evaluation and patient stated his Trintellix was causing him to have these suicidal thoughts).  After stating this, patient then states that he does not want to take Trintellix anymore, but then states that he does want to continue to take Trintellix because he does not want to withdraw from it and he is fearful that he will have seizures if he does not take his Trintellix.  Per nursing staff, patient refuses Trintellix this evening.  Although patient said he recently increased his Trintellix dose to 15 mg daily on exam, based on patient's contradictory statements regarding his Trintellix, believe his Trintellix ordered as previously documented in patient's last 04/18/2021 psychiatry visit for 10 mg p.o. daily at this time.  Recommend that dayshift treatment team discuss patient's Trintellix/potentially initiating a different psychotropic medications that he Trintellix with the patient if patient ultimately decides he does not want to take Trintellix anymore.  -Amlodipine 10 mg p.o. daily for hypertension  -Flexeril 5 to 10 mg p.o. 3 times daily as needed for muscle spasms  -Pepcid 20 mg p.o. twice daily for GERD  -HCTZ 6.25 mg p.o. daily (formulary alternative the patient's home medication of HCTZ 3.125 mg po daily-spoke with Grabill regarding this medication who stated that the smallest dose that they could provide at [facility] was 6.25 mg po daily.  Spoke with the patient regarding this and patient is agreeable to taking HCTZ 6.25 mg daily while he is here at the behavioral health urgent care.)   -Irbesartan 300 mg p.o. daily at bedtime for hypertension (formulary alternative the patient's home medication of telmisartan 80 mg p.o. at bedtime)  -Multivitamin with minerals tablet: 1 tablet p.o. daily for nutritional supplementation  -Oxycodone-acetaminophen 5-325 mg tablet: 1 tablet p.o. every 6 hours as needed for moderate pain AND oxycodone  immediate release tablet 5 mg p.o. every 6 hours as needed for moderate pain (formulary alternative to patient's home medication of oxycodone-acetaminophen 10-325 mg: 1 tablet p.o. every 6 hours as needed for pain  -Trazodone 75 mg p.o. daily at bedtime for sleep    Prescilla Sours, PA-C 06/03/21  8:05 PM

## 2021-06-03 NOTE — ED Notes (Signed)
Safe transport called 

## 2021-06-04 DIAGNOSIS — Z981 Arthrodesis status: Secondary | ICD-10-CM | POA: Diagnosis not present

## 2021-06-04 DIAGNOSIS — F411 Generalized anxiety disorder: Secondary | ICD-10-CM | POA: Diagnosis not present

## 2021-06-04 DIAGNOSIS — K219 Gastro-esophageal reflux disease without esophagitis: Secondary | ICD-10-CM | POA: Diagnosis not present

## 2021-06-04 DIAGNOSIS — Z87891 Personal history of nicotine dependence: Secondary | ICD-10-CM | POA: Diagnosis not present

## 2021-06-04 DIAGNOSIS — I10 Essential (primary) hypertension: Secondary | ICD-10-CM | POA: Diagnosis not present

## 2021-06-04 DIAGNOSIS — F410 Panic disorder [episodic paroxysmal anxiety] without agoraphobia: Secondary | ICD-10-CM | POA: Diagnosis not present

## 2021-06-04 DIAGNOSIS — Z79899 Other long term (current) drug therapy: Secondary | ICD-10-CM | POA: Diagnosis not present

## 2021-06-04 DIAGNOSIS — R45851 Suicidal ideations: Secondary | ICD-10-CM | POA: Diagnosis not present

## 2021-06-04 MED ORDER — LORAZEPAM 0.5 MG PO TABS
0.5000 mg | ORAL_TABLET | Freq: Two times a day (BID) | ORAL | Status: DC
  Administered 2021-06-04 – 2021-06-05 (×3): 0.5 mg via ORAL
  Filled 2021-06-04 (×3): qty 1

## 2021-06-04 MED ORDER — LORAZEPAM 0.5 MG PO TABS
0.5000 mg | ORAL_TABLET | Freq: Every day | ORAL | Status: DC | PRN
  Administered 2021-06-04: 0.5 mg via ORAL
  Filled 2021-06-04: qty 1

## 2021-06-04 MED ORDER — IRBESARTAN 75 MG PO TABS
300.0000 mg | ORAL_TABLET | Freq: Every day | ORAL | Status: DC
  Administered 2021-06-04: 300 mg via ORAL
  Filled 2021-06-04: qty 4

## 2021-06-04 MED ORDER — HYDROCHLOROTHIAZIDE 10 MG/ML ORAL SUSPENSION
6.2500 mg | Freq: Every day | ORAL | Status: DC
  Administered 2021-06-04 – 2021-06-05 (×2): 6.25 mg via ORAL
  Filled 2021-06-04 (×7): qty 1.25

## 2021-06-04 MED ORDER — DULOXETINE HCL 30 MG PO CPEP
30.0000 mg | ORAL_CAPSULE | Freq: Every day | ORAL | Status: DC
  Administered 2021-06-04 – 2021-06-05 (×2): 30 mg via ORAL
  Filled 2021-06-04 (×2): qty 1

## 2021-06-04 MED ORDER — VORTIOXETINE HBR 10 MG PO TABS: 10.0000 mg | ORAL_TABLET | Freq: Every day | ORAL | Status: DC

## 2021-06-04 MED ORDER — ACETAMINOPHEN 325 MG PO TABS
650.0000 mg | ORAL_TABLET | Freq: Once | ORAL | Status: AC
  Administered 2021-06-04: 650 mg via ORAL
  Filled 2021-06-04: qty 2

## 2021-06-04 MED ORDER — BUSPIRONE HCL 15 MG PO TABS
7.5000 mg | ORAL_TABLET | Freq: Two times a day (BID) | ORAL | Status: DC
  Administered 2021-06-04 – 2021-06-05 (×3): 7.5 mg via ORAL
  Filled 2021-06-04 (×3): qty 1

## 2021-06-04 NOTE — [facility] Group Notes (Signed)
Establishing Healthy Relationships  Establishing Healthy Relationships  Date: 06/04/21  Type of Therapy/Therapeutic Modalities: Motivational Interviewing, Supportive, Processing, CBT, DBT  Participation Level: Active   Objective: The purpose of this group is to discuss with patients the importance of maintaining healthy relationships and how they are cornerstones for overall well-being. Facilitators will guide discussion to explore strengths and weaknesses in maintaining healthy relationships.   Therapeutic Goals:  Patient will identify current health relationships in their life.  Patient will share identified weaknesses and strengths in their current relationships and in previous. Patient will discuss the importance of maintaining healthy relationships in their lives.   Summary of Patient's Progress:  [name] [name] was engaged and participated throughout the group session. [name] [name] shared that he feels as though he has healthy relationships currently. He reports that he believes that he hs porous or "open" boundaries due to his leniency with telling other people "no". He also shared that having "open" boundaries allows him to learn more from other people, being that if he is open, they would become open as well.

## 2021-06-04 NOTE — ED Notes (Signed)
Pt A&O x 4, cooperative and anxious, pacing at intervals, interactive with staff members.  Sitting in [place] room at present.  Pt denies being suicidal at present, stating that he never verbalized being suicidal.  Denies HI or AVH.  Monitoring for safety.  Pt contracts for safety.

## 2021-06-04 NOTE — ED Notes (Signed)
Pt given snack. 

## 2021-06-04 NOTE — BH Assessment (Deleted)
Establishing Healthy Relationships  Establishing Healthy Relationships  Date: 06/04/21  Type of Therapy/Therapeutic Modalities: Motivational Interviewing, Supportive, Processing, CBT, DBT  Participation Level: Active   Objective: The purpose of this group is to discuss with patients the importance of maintaining healthy relationships and how they are cornerstones for overall well-being. Facilitators will guide discussion to explore strengths and weaknesses in maintaining healthy relationships.   Therapeutic Goals:  Patient will identify current health relationships in their life.  Patient will share identified weaknesses and strengths in their current relationships and in previous. Patient will discuss the importance of maintaining healthy relationships in their lives.   Summary of Patient's Progress:  Shaquile was engaged and participated throughout the group session. Cristain shared that he feels as though he has healthy relationships currently. He reports that he believes that he hs porous or "open" boundaries due to his leniency with telling other people "no". He also shared that having "open" boundaries allows him to learn more from other people, being that if he is open, they would become open as well.

## 2021-06-04 NOTE — ED Notes (Signed)
Pt eating breakfast 

## 2021-06-04 NOTE — ED Notes (Signed)
Pt states Cymbalta not effective in managing his depression. MD present and explained that medication don't work in system this quickly. Pt was encouraged to utilize learned non-pharmacological measures to manage increased anxiety and depression (deep breathing exercises and pacing since pt states pacing helps manage his mood). Pt currently sitting in dayroom watching tv. Safety maintained.

## 2021-06-04 NOTE — ED Notes (Signed)
Pt sleeping at present, no distress noted.  Monitoring for safety. 

## 2021-06-04 NOTE — ED Notes (Signed)
Pt requested medication for increased anxiety. Ativan given per MD order.Pt educated on indication and SE of medication prior to administration.

## 2021-06-04 NOTE — ED Notes (Signed)
Pt up to nurses's station requesting medication for anxiety and difficulty sleeping. Margorie John, PA confirmed pt may have PRN Vistaril.

## 2021-06-04 NOTE — ED Notes (Signed)
Patient attended the evening group session and answered all discussion questions prompted from this Probation officer. Patient shared that he attended both groups today and spoke with the provider. Patient stated that the provider took him off one of his medications so he was very pleased with that decision. Patient also stated that he didn't get very good sleep last night so he's hoping that tonight is a better night. Patient rated his day a 4/10 and his affect was appropriate.

## 2021-06-04 NOTE — ED Provider Notes (Signed)
Behavioral Health Progress Note  Date and Time: 06/04/2021 4:20 PM Name: [name] Koyanagi Sr. MRN:  [id number]  Subjective:    [name] Bee Sr., 60 y.o., male patient seen face to face by this provider, chart reviewed and discussed with Dr. Serafina Mitchell on 06/04/21.    On evaluation [name] Mutch Sr. is walking the halls.  Patient walked calmly to his room for the assessment.  He is cooperative and alert/oriented x 4.denies depression at this time.  States his appetite is good but he did not sleep well last night.  Reports that he is anxious through out most of his day.  States he has panic attacks often, but has not had one while has been on the unit. Denies depression and states, " I am a happy person most of time, but that Trintellix was messing with me".  States Trintellix made his anxiety worse and did give him suicidal thoughts. He not appear to be responding to internal/external stimuli.  He denies auditory and visual hallucinations Patient reports he will attend group sessions today. Patient denied homicidal/self-harm ideation and paranoia. He denies suicidal ideations during this evaluation States he is not ready go home, because he would like for a new medication to be started and he does not want to start a new medication at home. State, "I am afraid I will have an adverse reaction".    Patient's current medication recommendation was discussed.  Explained to patient that his Ativan 0.5 mg will be changed to twice daily and there will be one Ativan 0.5 mg PRN DAILY he can take if he needs it for severe anxiety.  Explained he is only prescribed it BID by his outpatient provider.   Explained Ativan's mechanism of action and how it can actually make his anxiety worse.  Explained to patient that he also has hydroxyzine as needed.  States he refused Trintellix yesterday, states he no longer wants to take it. Requested medication be discontinued.  States he is concerned that he will withdrawal from  it.  Explained Trintellix can be stopped without a taper.  Discussed starting Cymbalta 30 mg p.o. daily for pain, anxiety, depression and BuSpar 7.5 mg p.o. twice daily for anxiety.  Patient is in agreement. Reassurance, support, and encouragement provided.   Patient reports he does not use alcohol or illegal substance.  Follows up with Dr. Adele Schilder every 3 months.  Discussed with patient the plan is for him to be discharged in the am. Patient is in agreement.   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   left   Erythrocytosis 11/30/2014   Essential hypertension    GERD (gastroesophageal reflux disease)    Headache(784.0)    2 x/week   History of kidney stones    Hypothyroidism    hx of    Leukocytosis, unspecified 11/03/2013   Limited joint range of motion    cervical fusion 02/2013   Medial meniscus tear 06/2013   left knee   Pheochromocytoma, right, 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 06/27/2017   Throat and mouth symptom    itchy throat x 3 weeks   Thyroid nodule  Tobacco abuse    Wears partial dentures    upper    Past Surgical History:  Procedure Laterality Date   ABDOMINAL SURGERY     ANTERIOR CERVICAL DECOMP/DISCECTOMY FUSION N/A 03/18/2013   Procedure: ANTERIOR CERVICAL DECOMPRESSION/DISCECTOMY FUSION 3 LEVELS;  Surgeon: Erline Levine, MD;  Location: [facility];  Service: Neurosurgery;  Laterality: N/A;  Anterior Cervical Three-Six  Decompression/Diskectomy/Fusion   APPENDECTOMY     CIRCUMCISION  05/04/2006   COLONOSCOPY     CYSTOSCOPY/URETEROSCOPY/HOLMIUM LASER/STENT PLACEMENT Left 11/24/2018   Procedure: CYSTOSCOPY/URETEROSCOPY/HOLMIUM LASER/STENT PLACEMENT;   Surgeon: Lucas Mallow, MD;  Location: [facility];  Service: Urology;  Laterality: Left;   DECOMPRESSIVE LUMBAR LAMINECTOMY LEVEL 1 Right 01/11/2020   Procedure: RIGHT LUMBAR FOUR-FIVE MINIMALLY INVASIVE LUMBAR DISCECTOMY;  Surgeon: Judith Part, MD;  Location: [place];  Service: Neurosurgery;  Laterality: Right;   EYE SURGERY     bilateral cataract surgery    HEMICOLECTOMY Right [id number]   with ileocolic anastomosis   INGUINAL HERNIA REPAIR     INSERTION OF MESH N/A 07/31/2017   Procedure: INSERTION OF MESH;  Surgeon: Johnathan Hausen, MD;  Location: [facility];  Service: General;  Laterality: N/A;   KNEE ARTHROSCOPY Left 08/01/2013   Procedure: LEFT ARTHROSCOPY KNEE;  Surgeon: Kerin Salen, MD;  Location: [place];  Service: Orthopedics;  Laterality: Left;   LAPAROSCOPIC ADRENALECTOMY Right 05/06/2017   Procedure: OPEN RIGHT ADRENALECTOMY WITH SMALL BOWEL RESECTION AND ANASTOMOSIS;  Surgeon: Kieth Brightly, Arta Bruce, MD;  Location: [facility];  Service: General;  Laterality: Right;   LAPAROSCOPIC LYSIS OF ADHESIONS N/A 07/31/2017   Procedure: LYSIS OF ADHESIONS;  Surgeon: Johnathan Hausen, MD;  Location: [facility];  Service: General;  Laterality: N/A;   LAPAROSCOPY N/A 07/31/2017   Procedure: LAPAROSCOPY DIAGNOSTIC;  Surgeon: Johnathan Hausen, MD;  Location: [facility];  Service: General;  Laterality: N/A;   LAPAROTOMY N/A 05/13/2017   Procedure: EXPLORATORY LAPAROTOMY, with wound dehisense, and repair of anastamotic leak;  Surgeon: [name], Arta Bruce, MD;  Location: [facility];  Service: General;  Laterality: N/A;   LAPAROTOMY N/A 05/14/2017   Procedure: EXPLORATORY LAPAROTOMY, LYSIS OF ADHESIONS/ SMALL BOWEL RESECTION/ WASHOUT OF ABDOMEN AND PLACEMENT OF NEGATIVE PRESSURE DRESSING;  Surgeon: [name], Arta Bruce, MD;  Location: [facility];  Service: General;  Laterality: N/A;   LAPAROTOMY N/A 05/17/2017   Procedure: EXPLORATORY LAPAROTOMY, PLACEMENT OF ILEOSTOMY TUBES, PARTIAL  CLOSURE OF FASCIA, WOUND VAC EXCHANGE;  Surgeon: [name], Arta Bruce, MD;  Location[facility];  Service: General;  Laterality: N/A;   LAPAROTOMY N/A 05/20/2017   Procedure: ABDOMINAL WOUND [place] OUT, WOUND CLOSURE AND WOUND VAC PLACEMENT;  Surgeon: [name], Arta Bruce, MD;  Location[facility];  Service: General;  Laterality: N/A;   LAPAROTOMY N/A 07/31/2017   Procedure: EXPLORATORY LAPAROTOMY WITH CLOSURE OF ENTEROTOMICS;  Surgeon: Johnathan Hausen, MD;  Location[facility];  Service: General;  Laterality: N/A;   SHOULDER ARTHROSCOPY W/ ROTATOR CUFF REPAIR Left 07/16/2010   SMALL INTESTINE SURGERY  04/24/2000   adhesiolysis, ileocolic anastomosis   UPPER GASTROINTESTINAL ENDOSCOPY     Family History:  Family History  Problem Relation Age of Onset   Colon cancer Father 23   Breast cancer Sister 2   Thyroid disease Neg Hx    Family Psychiatric  History: unknown Social History:  Social History   Substance and Sexual Activity  Alcohol Use Yes   Comment: occasionally     Social History   Substance and Sexual Activity  Drug Use No    Social History   Socioeconomic History   Marital status: Married    Spouse name: Not on file   Number of children: Not on file   Years of education: Not on file   Highest education level: Not on file  Occupational History   Not on file  Tobacco Use   Smoking status: Former    Packs/day: 1.00    Years: 25.00    Pack years: 25.00    Types: Cigarettes    Quit date: 2017    Years since quitting: 5.6   Smokeless tobacco: Never   Tobacco comments:    quit smoking 3 years ago  Vaping Use   Vaping Use: Never used  Substance and Sexual Activity   Alcohol use: Yes    Comment: occasionally   Drug use: No   Sexual activity: Not on file  Other Topics Concern   Not on file  Social History Narrative   Lives in [place] with his wife.   Social Determinants of Health   Financial Resource Strain: Not on file  Food Insecurity: Not on file  Transportation  Needs: Not on file  Physical Activity: Not on file  Stress: Not on file  Social Connections: Not on file   SDOH:  SDOH Screenings   Alcohol Screen: Not on file  Depression (PHQ2-9): Not on file  Financial Resource Strain: Not on file  Food Insecurity: Not on file  Housing: Not on file  Physical Activity: Not on file  Social Connections: Not on file  Stress: Not on file  Tobacco Use: Medium Risk   Smoking Tobacco Use: Former   Smokeless Tobacco Use: Never  Transportation Needs: Not on file   Additional Social History:         Sleep: Fair  Appetite:  Good  Current Medications:  Current Facility-Administered Medications  Medication Dose Route Frequency Provider Last Rate Last Admin   alum & mag hydroxide-simeth (MAALOX/MYLANTA) 200-200-20 MG/5ML suspension 30 mL  30 mL Oral Q4H PRN Lovena Le, Cody W, PA-C       amLODipine (NORVASC) tablet 10 mg  10 mg Oral Daily Margorie John W, PA-C   10 mg at 06/04/21 0929   busPIRone (BUSPAR) tablet 7.5 mg  7.5 mg Oral BID Revonda Humphrey, NP   7.5 mg at 06/04/21 1112   cyclobenzaprine (FLEXERIL) tablet 5-10 mg  5-10 mg Oral TID PRN Prescilla Sours, PA-C       DULoxetine (CYMBALTA) DR capsule 30 mg  30 mg Oral Daily Revonda Humphrey, NP   30 mg at 06/04/21 1115   famotidine (PEPCID) tablet 20 mg  20 mg Oral BID Margorie John W, PA-C   20 mg at 06/04/21 0930   hydrochlorothiazide 10 mg/mL oral suspension 6.25 mg  6.25 mg Oral Daily [place] Abbot, MD   6.25 mg at 06/04/21 1351   hydrOXYzine (ATARAX/VISTARIL) tablet 25 mg  25 mg Oral TID PRN Prescilla Sours, PA-C   25 mg at 06/04/21 0058   irbesartan (AVAPRO) tablet 300 mg  300 mg Oral QHS Margorie John W, PA-C       LORazepam (ATIVAN) tablet 0.5 mg  0.5 mg Oral BID Thomes Lolling H, NP   0.5 mg at 06/04/21 0930   LORazepam (ATIVAN) tablet 0.5 mg  0.5 mg Oral Daily PRN Revonda Humphrey, NP   0.5 mg at 06/04/21 1503   magnesium hydroxide (MILK OF MAGNESIA) suspension 30 mL  30 mL Oral Daily  PRN Prescilla Sours, PA-C       multivitamin with minerals tablet 1 tablet  1 tablet Oral Daily Prescilla Sours, PA-C   1 tablet at 06/04/21 0930   oxyCODONE-acetaminophen (PERCOCET/ROXICET) 5-325 MG per tablet 1 tablet  1 tablet Oral Q6H PRN [place] Abbot, MD   1 tablet at 06/03/21 2118   And   oxyCODONE (Oxy IR/ROXICODONE) immediate release tablet 5 mg  5 mg Oral Q6H PRN [place] Abbot, MD   5 mg at 06/03/21 2118   traZODone (DESYREL) tablet 75 mg  75 mg Oral QHS Margorie John W, PA-C   75 mg at 06/03/21 2300   Current Outpatient Medications  Medication Sig Dispense Refill   amLODipine (NORVASC) 10 MG tablet Take 10 mg by mouth daily.      betamethasone dipropionate 0.05 % cream Apply 1 application topically 2 (two) times daily as needed (rash).     cyclobenzaprine (FLEXERIL) 10 MG tablet Take 0.5-1 tablets (5-10 mg total) by mouth 3 (three) times daily as needed for muscle spasms. 20 tablet 0   famotidine (PEPCID) 40 MG tablet Take 20 mg by mouth 2 (two) times daily.     hydrochlorothiazide (HYDRODIURIL) 12.5 MG tablet Take 3.125 mg by mouth daily.      ibuprofen (ADVIL) 200 MG tablet Take 600 mg by mouth every 6 (six) hours as needed for moderate pain.     LORazepam (ATIVAN) 0.5 MG tablet Take 1 tablet (0.5 mg total) by mouth 2 (two) times daily. (Patient taking differently: Take 0.5 mg by mouth 3 (three) times daily.) 60 tablet 2   Multiple Vitamin (MULTIVITAMIN WITH MINERALS) TABS Take 1 tablet by mouth daily.     oxyCODONE-acetaminophen (PERCOCET) 10-325 MG tablet Take 1 tablet by mouth every 6 (six) hours as needed for pain.     Probiotic Product (PROBIOTIC PO) Take 1 capsule by mouth daily.     psyllium (METAMUCIL) 58.6 % packet Take 1 packet by mouth every other day.     sildenafil (VIAGRA) 100 MG tablet Take 100 mg by mouth daily as needed for erectile dysfunction.     telmisartan (MICARDIS) 80 MG tablet Take 80 mg by mouth at bedtime.     traZODone (DESYREL) 100 MG tablet Take 75 mg  by mouth at bedtime.     vortioxetine HBr (TRINTELLIX) 10 MG TABS tablet Take 2 10 mg tablets ('20mg'$  total) once daily. (Patient taking differently: Take 15 mg by mouth daily.) 28 tablet 0    Labs  Lab Results:  Admission on 06/03/2021, Discharged on 06/03/2021  Component Date Value Ref Range Status   SARS Coronavirus 2 by RT PCR 06/03/2021 NEGATIVE  NEGATIVE Final   Comment: (NOTE) SARS-CoV-2 target nucleic acids are NOT DETECTED.  The SARS-CoV-2 RNA is generally detectable in upper respiratory specimens during the acute phase of infection. The lowest concentration of SARS-CoV-2 viral copies this assay can detect is 138 copies/mL. A negative result does not preclude SARS-Cov-2 infection and should not be used as the sole basis for treatment or other patient management decisions. A negative result may occur with  improper specimen collection/handling, submission of specimen other than nasopharyngeal swab, presence of viral mutation(s) within the areas targeted by this assay, and inadequate number of viral copies(<138 copies/mL). A negative result must be combined with clinical observations, patient history, and epidemiological information. The expected result is Negative.  Fact Sheet for Patients:  EntrepreneurPulse.com.au  Fact Sheet for Healthcare Providers:  IncredibleEmployment.be  This test is  no                          t yet approved or cleared by the [place] and  has been authorized for detection and/or diagnosis of SARS-CoV-2 by FDA under an Emergency Use Authorization (EUA). This EUA will remain  in effect (meaning this test can be used) for the duration of the COVID-19 declaration under Section 564(b)(1) of the Act, 21 U.S.C.section 360bbb-3(b)(1), unless the authorization is terminated  or revoked sooner.       Influenza A by PCR 06/03/2021 NEGATIVE  NEGATIVE Final   Influenza B by PCR 06/03/2021 NEGATIVE  NEGATIVE  Final   Comment: (NOTE) The Xpert Xpress SARS-CoV-2/FLU/RSV plus assay is intended as an aid in the diagnosis of influenza from Nasopharyngeal swab specimens and should not be used as a sole basis for treatment. Nasal washings and aspirates are unacceptable for Xpert Xpress SARS-CoV-2/FLU/RSV testing.  Fact Sheet for Patients: EntrepreneurPulse.com.au  Fact Sheet for Healthcare Providers: IncredibleEmployment.be  This test is not yet approved or cleared by the [place] FDA and has been authorized for detection and/or diagnosis of SARS-CoV-2 by FDA under an Emergency Use Authorization (EUA). This EUA will remain in effect (meaning this test can be used) for the duration of the COVID-19 declaration under Section 564(b)(1) of the Act, 21 U.S.C. section 360bbb-3(b)(1), unless the authorization is terminated or revoked.  Performed at [facility], [place] [address], [place], [place] [address]    Sodium 06/03/2021 139  135 - 145 mmol/L Final   Potassium 06/03/2021 3.8  3.5 - 5.1 mmol/L Final   Chloride 06/03/2021 106  98 - 111 mmol/L Final   CO2 06/03/2021 24  22 - 32 mmol/L Final   Glucose, Bld 06/03/2021 92  70 - 99 mg/dL Final   Glucose reference range applies only to samples taken after fasting for at least 8 hours.   BUN 06/03/2021 8  6 - 20 mg/dL Final   Creatinine, Ser 06/03/2021 0.76  0.61 - 1.24 mg/dL Final   Calcium 06/03/2021 9.3  8.9 - 10.3 mg/dL Final   Total Protein 06/03/2021 7.1  6.5 - 8.1 g/dL Final   Albumin 06/03/2021 4.2  3.5 - 5.0 g/dL Final   AST 06/03/2021 26  15 - 41 U/L Final   ALT 06/03/2021 30  0 - 44 U/L Final   Alkaline Phosphatase 06/03/2021 66  38 - 126 U/L Final   Total Bilirubin 06/03/2021 0.7  0.3 - 1.2 mg/dL Final   GFR, Estimated 06/03/2021 >60  >60 mL/min Final   Comment: (NOTE) Calculated using the CKD-EPI Creatinine Equation (2021)    Anion gap 06/03/2021 9  5 - 15 Final   Performed  at [name], [place] [address], [place], [place] [address]   Alcohol, Ethyl (B) 06/03/2021 <10  <10 mg/dL Final   Comment: (NOTE) Lowest detectable limit for serum alcohol is 10 mg/dL.  For medical purposes only. Performed at [facility], [place] [address], [place], [place] [address]    Opiates 06/03/2021 NONE DETECTED  NONE DETECTED Final   Cocaine 06/03/2021 NONE DETECTED  NONE DETECTED Final   Benzodiazepines 06/03/2021 NONE DETECTED  NONE DETECTED Final   Amphetamines 06/03/2021 NONE DETECTED  NONE DETECTED Final   Tetrahydrocannabinol 06/03/2021 NONE DETECTED  NONE DETECTED Final   Barbiturates 06/03/2021 NONE DETECTED  NONE DETECTED Final   Comment: (NOTE) DRUG SCREEN FOR MEDICAL PURPOSES ONLY.  IF CONFIRMATION IS NEEDED FOR ANY PURPOSE, NOTIFY LAB WITHIN 5 DAYS.  LOWEST DETECTABLE LIMITS FOR URINE DRUG SCREEN Drug Class                     Cutoff (ng/mL) Amphetamine and metabolites    1000 Barbiturate and metabolites    200 Benzodiazepine                 [id number] Tricyclics and metabolites     300 Opiates and metabolites        300 Cocaine and metabolites        300 THC                            50 Performed at [facility], [place] [address], [place], [place] [address]    WBC 06/03/2021 7.5  4.0 - 10.5 K/uL Final   RBC 06/03/2021 4.56  4.22 - 5.81 MIL/uL Final   Hemoglobin 06/03/2021 12.6 (A) 13.0 - 17.0 g/dL Final   HCT 06/03/2021 39.1  39.0 - 52.0 % Final   MCV 06/03/2021 85.7  80.0 - 100.0 fL Final   MCH 06/03/2021 27.6  26.0 - 34.0 pg Final   MCHC 06/03/2021 32.2  30.0 - 36.0 g/dL Final   RDW 06/03/2021 14.3  11.5 - 15.5 % Final   Platelets 06/03/2021 273  150 - 400 K/uL Final   nRBC 06/03/2021 0.0  0.0 - 0.2 % Final   Neutrophils Relative % 06/03/2021 60  % Final   Neutro Abs 06/03/2021 4.5  1.7 - 7.7 K/uL Final   Lymphocytes Relative 06/03/2021 32  % Final   Lymphs Abs 06/03/2021 2.4  0.7 - 4.0 K/uL Final    Monocytes Relative 06/03/2021 7  % Final   Monocytes Absolute 06/03/2021 0.5  0.1 - 1.0 K/uL Final   Eosinophils Relative 06/03/2021 1  % Final   Eosinophils Absolute 06/03/2021 0.1  0.0 - 0.5 K/uL Final   Basophils Relative 06/03/2021 0  % Final   Basophils Absolute 06/03/2021 0.0  0.0 - 0.1 K/uL Final   Immature Granulocytes 06/03/2021 0  % Final   Abs Immature Granulocytes 06/03/2021 0.02  0.00 - 0.07 K/uL Final   Performed at [facility], [place] [address] [place], [place] [address]    Blood Alcohol level:  Lab Results  Component Value Date   ETH <10 0000000    Metabolic Disorder Labs: Lab Results  Component Value Date   HGBA1C 5.6 06/02/2017   MPG 114 06/02/2017   No results found for: PROLACTIN Lab Results  Component Value Date   CHOL 183 03/06/2015   TRIG 157 (H) 08/24/2017   HDL 34 (L) 03/06/2015   CHOLHDL 5.4 03/06/2015   VLDL 76 (H) 03/06/2015   LDLCALC 73 03/06/2015   LDLCALC 136 (H) 07/22/2013    Therapeutic Lab Levels: No results found for: LITHIUM No results found for: VALPROATE No components found for:  CBMZ  Physical Findings   PHQ2-9    [place] Office Visit from 07/22/2013 in [place]  PHQ-2 Total Score [place] ED from 06/03/2021 in [facility] Most recent reading at 06/03/2021  8:15 PM ED from 06/03/2021 in [place] DEPT Most recent reading at 06/03/2021  9:31 AM Video Visit from 04/18/2021 in [facility] Most recent reading at 04/18/2021  10:31 AM  C-SSRS RISK CATEGORY High Risk Low Risk No Risk        Musculoskeletal  Strength & Muscle Tone: within normal limits Gait & Station: normal Patient leans: N/A  Psychiatric Specialty Exam  Presentation  General Appearance: Casual; Appropriate for Environment  Eye Contact:Good  Speech:Clear and Coherent; Normal Rate  Speech  Volume:Normal  Handedness:Right   Mood and Affect  Mood:Anxious  Affect:Congruent   Thought Process  Thought Processes:Coherent  Descriptions of Associations:Intact  Orientation:Full (Time, Place and Person)  Thought Content:Logical  Diagnosis of Schizophrenia or Schizoaffective disorder in past: No    Hallucinations:Hallucinations: None  Ideas of Reference:None  Suicidal Thoughts:Suicidal Thoughts: No  Homicidal Thoughts:Homicidal Thoughts: No   Sensorium  Memory:Recent Good; Remote Good  Judgment:Good  Insight:Good   Executive Functions  Concentration:Good  Attention Span:Good  [place] of Knowledge:Good  Language:Good   Psychomotor Activity  Psychomotor Activity:Psychomotor Activity: Normal   Assets  Assets:Communication Skills; Desire for Improvement; Financial Resources/Insurance; Housing; Leisure Time; Physical Health; Resilience; Social Support   Sleep  Sleep:Sleep: Poor Number of Hours of Sleep: 4   Nutritional Assessment (For OBS and FBC admissions only) Has the patient had a weight loss or gain of 10 pounds or more in the last 3 months?: No Has the patient had a decrease in food intake/or appetite?: Yes Does the patient have dental problems?: No Does the patient have eating habits or behaviors that may be indicators of an eating disorder including binging or inducing vomiting?: No Has the patient recently lost weight without trying?: No Has the patient been eating poorly because of a decreased appetite?: Yes Malnutrition Screening Tool Score: 1   Physical Exam  Physical Exam Vitals and nursing note reviewed.  Constitutional:      Appearance: Normal appearance. He is well-developed.  HENT:     Head: Normocephalic and atraumatic.  Eyes:     General:        Right eye: No discharge.        Left eye: No discharge.     Conjunctiva/sclera: Conjunctivae normal.  Cardiovascular:     Rate and Rhythm: Normal rate.      Heart sounds: No murmur heard. Pulmonary:     Effort: Pulmonary effort is normal. No respiratory distress.  Abdominal:     Tenderness: There is no abdominal tenderness.  Musculoskeletal:        General: Normal range of motion.     Cervical back: Normal range of motion.  Skin:    General: Skin is warm and dry.     Coloration: Skin is not jaundiced or pale.  Neurological:     Mental Status: He is alert and oriented to person, place, and time.  Psychiatric:        Attention and Perception: Attention and perception normal.        Mood and Affect: Mood is anxious.        Speech: Speech normal.        Behavior: Behavior normal. Behavior is cooperative.        Thought Content: Thought content normal.        Cognition and Memory: Cognition normal.        Judgment: Judgment normal.   Review of Systems  Constitutional: Negative.  Negative for chills and fever.  HENT: Negative.  Negative for hearing loss.   Respiratory: Negative.  Negative for cough.   Cardiovascular: Negative.  Negative for chest pain.  Gastrointestinal: Negative.   Musculoskeletal: Negative.  Skin: Negative.   Neurological: Negative.   Psychiatric/Behavioral:  The patient is nervous/anxious.   Blood pressure 109/68, pulse 91, temperature 97.8 F (36.6 C), temperature source Oral, resp. rate 18, SpO2 97 %. There is no height or weight on file to calculate BMI.  Treatment Plan Summary:  Daily contact with patient to assess and evaluate symptoms and progress in treatment and Medication management  Initiated-Cymbalta 30 mg p.o. daily for pain, anxiety, depression and BuSpar 7.5 mg p.o. twice daily for anxiety.  Labs and EKG reviewed.  Plan is to Discharge patient in the am  Revonda Humphrey, NP 06/04/2021 4:20 PM

## 2021-06-04 NOTE — Group Therapy Note (Signed)
Crisis Management Monday - Crisis Management (Identifying Triggers/Coping Skills)  Date: 06/04/21  Type of Therapy/Therapeutic Modalities: Group, CBT, Motivational Interviewing, Supportive, Psych-Educational  Participation Level: Active  Objectives: To assist patients in identifying triggers and how their effects contribute to their mental health and overall well-being. Facilitators will guide discussions around identifying coping mechanisms that are unique and effective for each group member.   Therapeutic Goals: 1. Patient will identify triggers and the emotions associated with them to evaluate their    behavioral responses.  2. Patient will participate in group discussion discussing why they believe these triggers cause certain responses.  3. Patient will explore healthy coping mechanisms.  4. Patient participate in group discussion sharing how they plan to implement these    coping strategies in their day-to-day life and overall road to recovery.   Summary of Patient's Progress:  [name] [name] was an active listener and participate in the group. Pt reported he suffered from anxiety. Pt shared that he first remember dealing with anxiety during his 20's during a vacation. He stated "I thought I was dying". Pt stated that one of his triggers is over thinking. Pt stated that he think he would feel better if he could find others that felt the same way that he felt. Facilitator recommended finding some group therapies that is in his network of insurance.

## 2021-06-04 NOTE — ED Provider Notes (Signed)
[facility] Admission Suicide Risk Assessment   Nursing information obtained from:   chart Current Mental Status:   denies SI currently although he has been inconsistent with his reports of SI. Demographic Factors:  Male and Access to firearms  Loss Factors: Decline in physical health  Historical Factors: NA  Risk Reduction Factors:   Sense of responsibility to family, Living with another person, especially a relative, Positive social support, and Positive therapeutic relationship   Total Time spent with patient: 20 minutes Principal Problem: <principal problem not specified> Diagnosis:  Active Problems:   GAD (generalized anxiety disorder)  Subjective Data:  Per H&P: Patient is seen and examined by myself upon my arrival to [facility].  Patient states that he went to the emergency department earlier today for "anxiety, panic attacks, and headaches".  Patient states that the evening of 06/02/2021, he had a panic attack that lasted for about 30 to 40 minutes.  Patient denies any triggers associated with this panic attack.  Patient endorses having panic attacks at least once daily for the past few years now.  He states that his panic attacks are accompanied by the following physical symptoms of sharp/stabbing chest pain that occasionally radiates down his left arm, diaphoresis of his hands and feet, and tremor.  Patient denies any additional physical symptoms associated with his panic attacks and he denies experiencing any of the noted above physical symptoms currently on exam.  Patient denies history of seizures.  Patient states that he is struggling with anxiety since his 20s.  Patient denies SI currently on exam.  However, it is documented in 06/03/21 TTS evaluation that patient was reporting SI with plan to use a gun and it is also documented in this TTS evaluation and patient stated his Trintellix was causing him to have these suicidal thoughts.  Patient sees Dr. Adele Schilder for psychotropic medication  management.  Per chart review, it appears that patient's last visit with Dr. Adele Schilder was on 04/18/2021.  Per chart review of the 04/18/2021 visit, it appears that patient was taking Trintellix 10 mg daily as well as Ativan 0.5 mg twice daily at that time.  Patient is very contradictory and a poor historian regarding his medications.  Patient states that he recently increased his Trintellix to 15 mg daily on his own over the past week, but then states that the Trintellix makes his anxiety and panic attacks worse (it is also documented in 06/03/21 TTS evaluation that patient was reporting SI with plan to use a gun and it is also documented in this TTS evaluation and patient stated his Trintellix was causing him to have these suicidal thoughts).  After stating this, patient then states that he does not want to take Trintellix anymore, but then states that he does want to continue to take Trintellix because he does not want to withdraw from it and he is fearful that he will have seizures if he does not take his Trintellix.  Patient also states that he has recently increased his Ativan dosage on his own from 0.5 mg twice daily to 0.5 mg 3 times daily over the past few weeks. PDMP review shows patient recently filled prescription for 60 0.5 mg Ativan tablets on 05/30/2021.  Patient is also currently taking trazodone 75 mg at bedtime for sleep, which is prescribed by his PCP.  Patient states that he does not have another appointment with Dr. Adele Schilder scheduled at this time.  He states that his next appointment with Dr. Adele Schilder is in September 2022.  He states that he has tried to schedule a sooner psychiatry appointment but has been unsuccessful.  Patient does not have a therapist at this time.  He states that he last saw a therapist named Dr. Lutricia Feil through [facility] about 1 year ago but does not provide details regarding why he stopped seeing his therapist.  Patient denies taking any additional psychotropic medications at  this time.  Patient denies history of any past suicide attempts.  He denies history of self-injurious behavior via intentionally cutting or burning himself.  He denies HI, AVH, paranoia, or delusions.  Patient reports that he has been sleeping poorly, about 4 to 5 hours per night.  He endorses anhedonia.  When asked about feelings of guilt/hopelessness/worthlessness.  Patient states "not exactly" and does not provide further details.  He endorses fatigue but denies concentration changes.  He reports poor appetite but denies weight changes.  He denies history of being psychiatrically hospitalized in the past.  He reports that he lives in [place] with his wife and 50 year old son.  Patient states that there is one firearm in his home, but he states that this firearm is locked up.  He denies having any thoughts about wanting to use this firearm on himself or others (this contradicts what is documented in 06/03/21 TTS evaluation- see above paragraph for details).  Patient states that he is currently on disability for back and neck issues from when he had neck and back surgery about 8 years ago.  Patient reports having intermittent headaches since he had these procedures done to his neck and back 8 years ago.  Patient states that he follows up with pain management every 3 months and takes oxycodone every 6 hours as needed for neck and back pain.  PDMP review shows that patient most recently had prescription filled for 135 oxycodone-acetaminophen 10-325 mg tablets on 05/30/2021.  Patient denies alcohol, tobacco, or illicit drug use.  Today, Patient is interviewed in his room. Prior to interview, he was observed pacing back and forth in the hallways. He denies SI/HI/AVH but reports elevated anxiety. Pt expresses that he feels that trintellix was contributing to his anxiety and indicates he would like to adjust his medications. Pt agreeable to cymbalta 30 mg for mood and anxiety as it has properties that can assist with  pain relief and buspar 7.5 mg BID for anxiety. When discussing SI patient indicates that he never reported SI to anyone (this is non consistent with chart review) and that he was requesting assistance with changing medications. Prior to my interview, patient had indicated to NP that he did not feel confident in his ability to keep himself safe (see progress note from earlier today); per H&P patient also has access to firearms which raised concern that a higher level of care may be needed.    Continued Clinical Symptoms:      CLINICAL FACTORS:   Severe Anxiety and/or Agitation Panic Attacks Depression:   Anhedonia Hopelessness Chronic Pain Medical Diagnoses and Treatments/Surgeries   Musculoskeletal: Strength & Muscle Tone: within normal limits Gait & Station: normal Patient leans: N/A  Psychiatric Specialty Exam:  Presentation  General Appearance: Casual; Appropriate for Environment  Eye Contact:Good  Speech:Clear and Coherent; Normal Rate  Speech Volume:Normal  Handedness:Right   Mood and Affect  Mood:Anxious  Affect:Congruent   Thought Process  Thought Processes:Coherent  Descriptions of Associations:Intact  Orientation:Full (Time, Place and Person)  Thought Content:Logical  History of Schizophrenia/Schizoaffective disorder:No  Duration of Psychotic Symptoms:No data recorded Hallucinations:Hallucinations:  None  Ideas of Reference:None  Suicidal Thoughts:Suicidal Thoughts: No  Homicidal Thoughts:Homicidal Thoughts: No   Sensorium  Memory:Recent Good; Remote Good  Judgment:Good  Insight:Good   Executive Functions  Concentration:Good  Attention Span:Good  [place] of Knowledge:Good  Language:Good   Psychomotor Activity  Psychomotor Activity:Psychomotor Activity: Normal   Assets  Assets:Communication Skills; Desire for Improvement; Financial Resources/Insurance; Housing; Leisure Time; Physical Health; Resilience; Social  Support   Sleep  Sleep:Sleep: Poor Number of Hours of Sleep: 4    Physical Exam: Physical Exam Constitutional:      Appearance: Normal appearance. He is normal weight.  HENT:     Head: Normocephalic and atraumatic.  Eyes:     Extraocular Movements: Extraocular movements intact.  Pulmonary:     Effort: Pulmonary effort is normal.  Neurological:     Mental Status: He is alert.   Review of Systems  Constitutional:  Negative for chills and fever.  HENT:  Negative for hearing loss.   Eyes:  Negative for discharge and redness.  Respiratory:  Negative for cough.   Cardiovascular:  Negative for chest pain.  Gastrointestinal:  Negative for abdominal pain.  Musculoskeletal:  Negative for myalgias.  Neurological:  Positive for headaches.  Psychiatric/Behavioral:  Negative for hallucinations. The patient is nervous/anxious.   Blood pressure 109/68, pulse 91, temperature 97.8 F (36.6 C), temperature source Oral, resp. rate 18, SpO2 97 %. There is no height or weight on file to calculate BMI.   COGNITIVE FEATURES THAT CONTRIBUTE TO RISK:  Closed-mindedness    SUICIDE RISK:   Moderate:  Frequent suicidal ideation with limited intensity, and duration, some specificity in terms of plans, no associated intent, good self-control, limited dysphoria/symptomatology, some risk factors present, and identifiable protective factors, including available and accessible social support.  PLAN OF CARE:  Patient seen and chart reviewed. Patient accepted as transfer from the ED for anxiety as well as SI; per documentation patient has access to firearms and reported that he had a plan to use a gun (see TTS assessment). Patient reports ongoing anxiety and reports inconsistent information to myself and NP regarding SI and ability to keep himself safe if he should leave the hospital. Labs reviewed-CMP, CBC unremarkable. UDS neg, etoh neg. Patient started on cymbalta 30 mg daily for mood and anxiety,  discontinued trintellix, and started on buspar 7.5 mg BID. Continued ativan as scheduled BID and PRN dose --ativan was home medication; other home medications continued. Patient admitted to [facility] currently but higher level of care indicated. Will seek higher level of care.   I certify that inpatient services furnished can reasonably be expected to improve the patient's condition.   Ival Bible, MD 06/04/2021, 9:41 AM

## 2021-06-04 NOTE — ED Notes (Signed)
Pt eating dinner

## 2021-06-04 NOTE — ED Notes (Signed)
Pt asleep in bed. Respirations even and unlabored. Will continue to monitor for safety. ?

## 2021-06-04 NOTE — ED Notes (Signed)
D:  Patient A&Ox4. Denies intent to harm self/others when asked. Denies A/VH. Patient denies any physical complaints when asked. Complain of increased anxiety.     A: Support and encouragement provided. Discussed coping mechanisms for increased anxiety (walking, deep breathing exercises). Routine safety checks conducted according to facility protocol. Encouraged patient to notify staff if thoughts of harm toward self or others arise. Patient verbalize understanding and agreement.   R: Patient remains safe and patient verbally contracts for safety at this time. Pt performed return demonstation of deep breathing exercises. Will continue to monitor.

## 2021-06-04 NOTE — Clinical Social Work Psych Note (Signed)
CSW attempted to refer patient to a higher level of care for inpatient treatment, however [facility] is currently unable to accommodate patient's needs at this time per [facility].  CSW referred the patient to the following facilities for review:   [facility]   CSW will continue to follow for possible placement.    Radonna Ricker, MSW, LCSW Clinical Education officer, museum ([place]) [facility]

## 2021-06-05 DIAGNOSIS — Z87891 Personal history of nicotine dependence: Secondary | ICD-10-CM | POA: Diagnosis not present

## 2021-06-05 DIAGNOSIS — K219 Gastro-esophageal reflux disease without esophagitis: Secondary | ICD-10-CM | POA: Diagnosis not present

## 2021-06-05 DIAGNOSIS — F410 Panic disorder [episodic paroxysmal anxiety] without agoraphobia: Secondary | ICD-10-CM | POA: Diagnosis not present

## 2021-06-05 DIAGNOSIS — F411 Generalized anxiety disorder: Secondary | ICD-10-CM | POA: Diagnosis not present

## 2021-06-05 DIAGNOSIS — Z981 Arthrodesis status: Secondary | ICD-10-CM | POA: Diagnosis not present

## 2021-06-05 DIAGNOSIS — Z79899 Other long term (current) drug therapy: Secondary | ICD-10-CM | POA: Diagnosis not present

## 2021-06-05 DIAGNOSIS — R45851 Suicidal ideations: Secondary | ICD-10-CM | POA: Diagnosis not present

## 2021-06-05 DIAGNOSIS — I10 Essential (primary) hypertension: Secondary | ICD-10-CM | POA: Diagnosis not present

## 2021-06-05 MED ORDER — DULOXETINE HCL 30 MG PO CPEP: 30.0000 mg | ORAL_CAPSULE | Freq: Every day | ORAL | 0 refills | Status: DC

## 2021-06-05 MED ORDER — HYDROXYZINE HCL 25 MG PO TABS: 25.0000 mg | ORAL_TABLET | Freq: Three times a day (TID) | ORAL | 0 refills | Status: DC | PRN

## 2021-06-05 MED ORDER — BUSPIRONE HCL 7.5 MG PO TABS: 7.5000 mg | ORAL_TABLET | Freq: Two times a day (BID) | ORAL | 0 refills | Status: DC

## 2021-06-05 MED ORDER — ACETAMINOPHEN 325 MG PO TABS
650.0000 mg | ORAL_TABLET | Freq: Once | ORAL | Status: AC
  Administered 2021-06-05: 650 mg via ORAL
  Filled 2021-06-05: qty 2

## 2021-06-05 NOTE — ED Notes (Signed)
Pt sleeping at present, no distress noted.  Monitoring for safety. 

## 2021-06-05 NOTE — ED Notes (Signed)
Called safe transport service to take pt to [name] parking lot to retrieve his car. Pt made aware.

## 2021-06-05 NOTE — ED Notes (Signed)
Pt sleeping in no acute distress. RR even and unlabored. Safety maintained. 

## 2021-06-05 NOTE — ED Provider Notes (Signed)
FBC/OBS ASAP Discharge Summary  Date and Time: 06/05/2021 8:55 AM  Name: [name] [name] Sr.  MRN:  [id number]   Discharge Diagnoses:  Final diagnoses:  GAD (generalized anxiety disorder)  Panic attacks    Subjective: Patient states "I am ready to go, I think that counselor in the emergency department made more out of this than there is."  He verbalizes readiness to discharge home.  [name] [name] reports he has been diagnosed with anxiety and depression.  He has recently began trending down his lorazepam as instructed by outpatient psychiatrist, Dr. Adele Schilder.  He has mild concern that decrease is happening too rapidly, reports lengthy conversation with outpatient psychiatrist to address concerns. [name] [name] reports he was prescribed lorazepam at a dose of 4 mg/day after a recent surgery states "I think that my body got used to it."  2 to 3 months ago his primary care provider began weaning the lorazepam but met with resistance from [name] [name].  At that time he was transitioned to outpatient psychiatry with Dr. Adele Schilder, who has continued to trend down the lorazepam.  He reports he has followed with outpatient therapy, Dr. Lutricia Feil, has not seen therapist in some time.  He reports plan to reestablish outpatient treatment with therapy upon discharge.  During facility based crisis stay, Trintellix discontinued as [name] [name] reported increased anxiety with this medication.  Initiation of duloxetine and buspirone to address anxiety and depression, hydroxyzine initiated as needed with just continued anxiety.  Patient reports tolerating these medications without concern.  Patient is reassessed face-to-face by nurse practitioner, chart reviewed.  Chart findings discussed with the treatment team.  He is visible on unit, found and common area watching television with peer upon my approach.   Patient is seated in patient room, no acute distress.  He is alert and oriented, pleasant and cooperative during assessment.  He reports anxious  mood with congruent affect.  He attributes anxiety this morning to the fact that he has not yet taken his medication.  He reports depression much improved since arrival to emergency department.  He denies suicidal and homicidal ideations.  He denies any history of suicide attempts, denies any history of self-harm behavior.  He contracts verbally for safety with this Probation officer.  He has normal speech and behavior.  He denies both auditory and visual hallucinations.   Patient is able to converse coherently with goal-directed thoughts and no distractibility or preoccupation. He denies paranoia.  Objectively there is no evidence of psychosis/mania or delusional thinking. Patient is insightful regarding admission and current medication regimen.  Patient offered support and encouragement.  He also is able to articulately discuss diagnosis and recommendations.   He reports average appetite and sleep. He resides in [place] with his wife.  He denies alcohol and substance use.  Patient shared that he did have access to 1 weapon in home, weapon has been removed by his wife, verified by this Probation officer.  Patient offered support and encouragement. [name] [name] gives verbal consent to speak with wife, Retia [phone].  Spoke with patient's wife, Retia who denies concern for patient's safety, she reports she has never experienced concern for the safety of [name] [name] or anyone around.. She will remove safe containing weapon from home at this time.  Stay Summary:  Per H & P from 06/04/2021 at 0848am:   On evaluation [name] [name] Sr. is walking the halls.  Patient walked calmly to his room for the assessment.  He is cooperative and alert/oriented x 4.denies depression at this time.  States  his appetite is good but he did not sleep well last night.  Reports that he is anxious through out most of his day.  States he has panic attacks often, but has not had one while has been on the unit. Denies depression and states, " I am a happy  person most of time, but that Trintellix was messing with me".  States Trintellix made his anxiety worse and did give him suicidal thoughts. He not appear to be responding to internal/external stimuli.  He denies auditory and visual hallucinations Patient reports he will attend group sessions today. Patient denied homicidal/self-harm ideation and paranoia. He denies suicidal ideations during this evaluation States he is not ready go home, because he would like for a new medication to be started and he does not want to start a new medication at home. State, "I am afraid I will have an adverse reaction".     Patient's current medication recommendation was discussed.  Explained to patient that his Ativan 0.5 mg will be changed to twice daily and there will be one Ativan 0.5 mg PRN DAILY he can take if he needs it for severe anxiety.  Explained he is only prescribed it BID by his outpatient provider.   Explained Ativan's mechanism of action and how it can actually make his anxiety worse.  Explained to patient that he also has hydroxyzine as needed.  States he refused Trintellix yesterday, states he no longer wants to take it. Requested medication be discontinued.  States he is concerned that he will withdrawal from it.  Explained Trintellix can be stopped without a taper.  Discussed starting Cymbalta 30 mg p.o. daily for pain, anxiety, depression and BuSpar 7.5 mg p.o. twice daily for anxiety.  Patient is in agreement. Reassurance, support, and encouragement provided.    Patient reports he does not use alcohol or illegal substance.  Follows up with Dr. Adele Schilder every 3 months.   Discussed with patient the plan is for him to be discharged in the am. Patient is in agreement.   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   left   Erythrocytosis 11/30/2014   Essential hypertension    GERD (gastroesophageal reflux disease)    Headache(784.0)    2 x/week   History of kidney stones    Hypothyroidism    hx of    Leukocytosis, unspecified 11/03/2013   Limited joint range of motion    cervical fusion 02/2013   Medial meniscus tear 06/2013   left knee   Pheochromocytoma, right, 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 06/27/2017   Throat and mouth symptom    itchy throat x 3 weeks   Thyroid nodule    Tobacco abuse    Wears partial dentures    upper    Past Surgical History:  Procedure Laterality Date   ABDOMINAL SURGERY     ANTERIOR CERVICAL DECOMP/DISCECTOMY FUSION N/A 03/18/2013   Procedure: ANTERIOR CERVICAL DECOMPRESSION/DISCECTOMY FUSION 3 LEVELS;  Surgeon: Erline Levine, MD;  Location: [facility];  Service: Neurosurgery;  Laterality: N/A;  Anterior Cervical Three-Six  Decompression/Diskectomy/Fusion   APPENDECTOMY     CIRCUMCISION  05/04/2006   COLONOSCOPY     CYSTOSCOPY/URETEROSCOPY/HOLMIUM LASER/STENT PLACEMENT Left 11/24/2018   Procedure: CYSTOSCOPY/URETEROSCOPY/HOLMIUM  LASER/STENT PLACEMENT;  Surgeon: Lucas Mallow, MD;  Location: [facility];  Service: Urology;  Laterality: Left;   DECOMPRESSIVE LUMBAR LAMINECTOMY LEVEL 1 Right 01/11/2020   Procedure: RIGHT LUMBAR FOUR-FIVE MINIMALLY INVASIVE LUMBAR DISCECTOMY;  Surgeon: Judith Part, MD;  Location: [facility];  Service: Neurosurgery;  Laterality: Right;   EYE SURGERY     bilateral cataract surgery    HEMICOLECTOMY Right 0/62/3762   with ileocolic anastomosis   INGUINAL HERNIA REPAIR     INSERTION OF MESH N/A 07/31/2017   Procedure: INSERTION OF MESH;  Surgeon: Johnathan Hausen, MD;  Location: [facility];  Service: General;  Laterality: N/A;   KNEE ARTHROSCOPY Left 08/01/2013   Procedure: LEFT ARTHROSCOPY KNEE;  Surgeon: Kerin Salen, MD;  Location: [facility];  Service: Orthopedics;  Laterality: Left;   LAPAROSCOPIC ADRENALECTOMY Right 05/06/2017   Procedure: OPEN RIGHT ADRENALECTOMY WITH SMALL BOWEL RESECTION AND ANASTOMOSIS;  Surgeon: Kieth Brightly, Arta Bruce, MD;  Location: [facility];  Service: General;  Laterality: Right;   LAPAROSCOPIC LYSIS OF ADHESIONS N/A 07/31/2017   Procedure: LYSIS OF ADHESIONS;  Surgeon: Johnathan Hausen, MD;  Location: [facility];  Service: General;  Laterality: N/A;   LAPAROSCOPY N/A 07/31/2017   Procedure: LAPAROSCOPY DIAGNOSTIC;  Surgeon: Johnathan Hausen, MD;  Location: [facility];  Service: General;  Laterality: N/A;   LAPAROTOMY N/A 05/13/2017   Procedure: EXPLORATORY LAPAROTOMY, with wound dehisense, and repair of anastamotic leak;  Surgeon: [name], Arta Bruce, MD;  Location: [facility];  Service: General;  Laterality: N/A;   LAPAROTOMY N/A 05/14/2017   Procedure: EXPLORATORY LAPAROTOMY, LYSIS OF ADHESIONS/ SMALL BOWEL RESECTION/ WASHOUT OF ABDOMEN AND PLACEMENT OF NEGATIVE PRESSURE DRESSING;  Surgeon: [name], Arta Bruce, MD;  Location: [facility];  Service: General;  Laterality: N/A;   LAPAROTOMY N/A 05/17/2017   Procedure: EXPLORATORY LAPAROTOMY, PLACEMENT OF ILEOSTOMY TUBES, PARTIAL CLOSURE OF FASCIA, WOUND VAC EXCHANGE;  Surgeon: [name], Arta Bruce, MD;  Location: [facility];  Service: General;  Laterality: N/A;   LAPAROTOMY N/A 05/20/2017   Procedure: ABDOMINAL WOUND [place] OUT, WOUND CLOSURE AND WOUND VAC PLACEMENT;  Surgeon: [name], Arta Bruce, MD;  Location: [facility];  Service: General;  Laterality: N/A;   LAPAROTOMY N/A 07/31/2017   Procedure: EXPLORATORY LAPAROTOMY WITH CLOSURE OF ENTEROTOMICS;  Surgeon: Johnathan Hausen, MD;  Location: [facility];  Service: General;  Laterality: N/A;   SHOULDER ARTHROSCOPY W/ ROTATOR CUFF REPAIR Left 07/16/2010   SMALL INTESTINE SURGERY  04/24/2000   adhesiolysis, ileocolic anastomosis   UPPER GASTROINTESTINAL ENDOSCOPY     Family History:  Family History  Problem Relation Age of Onset    Colon cancer Father 61   Breast cancer Sister 70   Thyroid disease Neg Hx    Family Psychiatric History: None reported Social History:  Social History   Substance and Sexual Activity  Alcohol Use Yes   Comment: occasionally     Social History   Substance and Sexual Activity  Drug Use No    Social History   Socioeconomic History   Marital status: Married    Spouse name: Not on file   Number of children: Not on file   Years of education: Not on file   Highest education level: Not on file  Occupational History   Not on file  Tobacco Use   Smoking status: Former    Packs/day: 1.00    Years: 25.00    Pack years: 25.00    Types: Cigarettes    Quit date: 2017  Years since quitting: 5.6   Smokeless tobacco: Never   Tobacco comments:    quit smoking 3 years ago  Vaping Use   Vaping Use: Never used  Substance and Sexual Activity   Alcohol use: Yes    Comment: occasionally   Drug use: No   Sexual activity: Not on file  Other Topics Concern   Not on file  Social History Narrative   Lives in [place] with his wife.   Social Determinants of Health   Financial Resource Strain: Not on file  Food Insecurity: Not on file  Transportation Needs: Not on file  Physical Activity: Not on file  Stress: Not on file  Social Connections: Not on file   SDOH:  SDOH Screenings   Alcohol Screen: Not on file  Depression (PHQ2-9): Not on file  Financial Resource Strain: Not on file  Food Insecurity: Not on file  Housing: Not on file  Physical Activity: Not on file  Social Connections: Not on file  Stress: Not on file  Tobacco Use: Medium Risk   Smoking Tobacco Use: Former   Smokeless Tobacco Use: Never  Transportation Needs: Not on file    Tobacco Cessation:  N/A, patient does not currently use tobacco products  Current Medications:  Current Facility-Administered Medications  Medication Dose Route Frequency Provider Last Rate Last Admin   alum & mag hydroxide-simeth  (MAALOX/MYLANTA) 200-200-20 MG/5ML suspension 30 mL  30 mL Oral Q4H PRN Lovena Le, Cody W, PA-C       amLODipine (NORVASC) tablet 10 mg  10 mg Oral Daily Margorie John W, PA-C   10 mg at 06/04/21 0929   busPIRone (BUSPAR) tablet 7.5 mg  7.5 mg Oral BID Revonda Humphrey, NP   7.5 mg at 06/04/21 2131   cyclobenzaprine (FLEXERIL) tablet 5-10 mg  5-10 mg Oral TID PRN Prescilla Sours, PA-C       DULoxetine (CYMBALTA) DR capsule 30 mg  30 mg Oral Daily Revonda Humphrey, NP   30 mg at 06/04/21 1115   famotidine (PEPCID) tablet 20 mg  20 mg Oral BID Margorie John W, PA-C   20 mg at 06/04/21 2131   hydrochlorothiazide 10 mg/mL oral suspension 6.25 mg  6.25 mg Oral Daily Hampton Abbot, MD   6.25 mg at 06/04/21 1351   hydrOXYzine (ATARAX/VISTARIL) tablet 25 mg  25 mg Oral TID PRN Prescilla Sours, PA-C   25 mg at 06/05/21 0536   irbesartan (AVAPRO) tablet 300 mg  300 mg Oral QHS Margorie John W, PA-C   300 mg at 06/04/21 2132   LORazepam (ATIVAN) tablet 0.5 mg  0.5 mg Oral BID Revonda Humphrey, NP   0.5 mg at 06/04/21 2131   LORazepam (ATIVAN) tablet 0.5 mg  0.5 mg Oral Daily PRN Revonda Humphrey, NP   0.5 mg at 06/04/21 1503   magnesium hydroxide (MILK OF MAGNESIA) suspension 30 mL  30 mL Oral Daily PRN Margorie John W, PA-C       multivitamin with minerals tablet 1 tablet  1 tablet Oral Daily Margorie John W, PA-C   1 tablet at 06/04/21 0930   oxyCODONE-acetaminophen (PERCOCET/ROXICET) 5-325 MG per tablet 1 tablet  1 tablet Oral Q6H PRN Hampton Abbot, MD   1 tablet at 06/04/21 2130   And   oxyCODONE (Oxy IR/ROXICODONE) immediate release tablet 5 mg  5 mg Oral Q6H PRN Hampton Abbot, MD   5 mg at 06/03/21 2118   traZODone (DESYREL) tablet 75 mg  75  mg Oral QHS Prescilla Sours, PA-C   75 mg at 06/04/21 2131   Current Outpatient Medications  Medication Sig Dispense Refill   amLODipine (NORVASC) 10 MG tablet Take 10 mg by mouth daily.      betamethasone dipropionate 0.05 % cream Apply 1 application topically 2  (two) times daily as needed (rash).     cyclobenzaprine (FLEXERIL) 10 MG tablet Take 0.5-1 tablets (5-10 mg total) by mouth 3 (three) times daily as needed for muscle spasms. 20 tablet 0   famotidine (PEPCID) 40 MG tablet Take 20 mg by mouth 2 (two) times daily.     hydrochlorothiazide (HYDRODIURIL) 12.5 MG tablet Take 3.125 mg by mouth daily.      ibuprofen (ADVIL) 200 MG tablet Take 600 mg by mouth every 6 (six) hours as needed for moderate pain.     LORazepam (ATIVAN) 0.5 MG tablet Take 1 tablet (0.5 mg total) by mouth 2 (two) times daily. (Patient taking differently: Take 0.5 mg by mouth 3 (three) times daily.) 60 tablet 2   Multiple Vitamin (MULTIVITAMIN WITH MINERALS) TABS Take 1 tablet by mouth daily.     oxyCODONE-acetaminophen (PERCOCET) 10-325 MG tablet Take 1 tablet by mouth every 6 (six) hours as needed for pain.     Probiotic Product (PROBIOTIC PO) Take 1 capsule by mouth daily.     psyllium (METAMUCIL) 58.6 % packet Take 1 packet by mouth every other day.     sildenafil (VIAGRA) 100 MG tablet Take 100 mg by mouth daily as needed for erectile dysfunction.     telmisartan (MICARDIS) 80 MG tablet Take 80 mg by mouth at bedtime.     traZODone (DESYREL) 100 MG tablet Take 75 mg by mouth at bedtime.     vortioxetine HBr (TRINTELLIX) 10 MG TABS tablet Take 2 10 mg tablets (55m total) once daily. (Patient taking differently: Take 15 mg by mouth daily.) 28 tablet 0    PTA Medications: (Not in a hospital admission)   Musculoskeletal  Strength & Muscle Tone: within normal limits Gait & Station: normal Patient leans: N/A  Psychiatric Specialty Exam  Presentation  General Appearance: Appropriate for Environment; Casual  Eye Contact:Good  Speech:Clear and Coherent; Normal Rate  Speech Volume:Normal  Handedness:Right   Mood and Affect  Mood:Anxious  Affect:Congruent; Appropriate   Thought Process  Thought Processes:Coherent; Goal Directed  Descriptions of  Associations:Intact  Orientation:Full (Time, Place and Person)  Thought Content:Logical; WDL  Diagnosis of Schizophrenia or Schizoaffective disorder in past: No    Hallucinations:Hallucinations: None  Ideas of Reference:None  Suicidal Thoughts:Suicidal Thoughts: No  Homicidal Thoughts:Homicidal Thoughts: No   Sensorium  Memory:Immediate Good; Recent Good; Remote Good  Judgment:Good  Insight:Good   Executive Functions  Concentration:Good  Attention Span:Good  [place] Knowledge:Good  Language:Good   Psychomotor Activity  Psychomotor Activity:Psychomotor Activity: Normal   Assets  Assets:Communication Skills; Desire for Improvement; Financial Resources/Insurance; Housing; Intimacy; Leisure Time; Physical Health; Resilience; Social Support; Talents/Skills; Transportation   Sleep  Sleep:Sleep: Fair   No data recorded  Physical Exam  Physical Exam Vitals and nursing note reviewed.  Constitutional:      Appearance: Normal appearance. He is well-developed and normal weight.  HENT:     Head: Normocephalic and atraumatic.     Nose: Nose normal.  Cardiovascular:     Rate and Rhythm: Normal rate.  Pulmonary:     Effort: Pulmonary effort is normal.  Musculoskeletal:        General: Normal range of  motion.     Cervical back: Normal range of motion.  Skin:    General: Skin is warm and dry.  Neurological:     Mental Status: He is alert and oriented to person, place, and time.  Psychiatric:        Attention and Perception: Attention and perception normal.        Mood and Affect: Affect normal. Mood is anxious.        Speech: Speech normal.        Behavior: Behavior normal. Behavior is cooperative.        Thought Content: Thought content normal.        Cognition and Memory: Cognition and memory normal.        Judgment: Judgment normal.   Review of Systems  Constitutional: Negative.   HENT: Negative.    Eyes: Negative.   Respiratory:  Negative.    Cardiovascular: Negative.   Gastrointestinal: Negative.   Genitourinary: Negative.   Musculoskeletal: Negative.   Skin: Negative.   Neurological: Negative.   Endo/Heme/Allergies: Negative.   Psychiatric/Behavioral:  The patient is nervous/anxious.   Blood pressure 125/86, pulse 83, temperature 98.5 F (36.9 C), temperature source Oral, resp. rate 18, SpO2 100 %. There is no height or weight on file to calculate BMI.  Demographic Factors:  Male  Loss Factors: NA  Historical Factors: NA  Risk Reduction Factors:   Sense of responsibility to family, Living with another person, especially a relative, Positive social support, Positive therapeutic relationship, and Positive coping skills or problem solving skills  Continued Clinical Symptoms:  Previous Psychiatric Diagnoses and Treatments  Cognitive Features That Contribute To Risk:  None    Suicide Risk:  Minimal: No identifiable suicidal ideation.  Patients presenting with no risk factors but with morbid ruminations; may be classified as minimal risk based on the severity of the depressive symptoms  Plan Of Care/Follow-up recommendations:  Patient reviewed with Dr. Serafina Mitchell. Follow-up with established outpatient psychiatry, Dr. Adele Schilder at [facility] outpatient.  Follow-up with outpatient therapy through Dr. Lutricia Feil. Current medications: -Amlodipine 10 mg daily -Betamethasone dipropionate cream 1 application topically twice daily as needed -Buspirone 7.5 mg twice daily/mood -Cyclobenzaprine 5-10 mg 3 times daily as needed/muscle spasms -Duloxetine 30 mg daily/mood -Famotidine 20 mg twice daily -Hydrochlorothiazide 3.125 mg daily -Hydroxyzine 25 mg 3 times daily as needed/anxiety -Ibuprofen 600 mg every 6 hours as needed/moderate pain -Lorazepam 0.5 mg twice daily -Multivitamin with minerals 1 tab daily -Oxycodone-acetaminophen 10-325 1 tablet every 6 as needed/pain -Probiotic 1 capsule by mouth  daily -Psyllium 58.6 percent packet 1 packet by mouth every other day -Sildenafil 100 mg daily as needed/erectile dysfunction -Telmisartan 80 mg nightly -Trazodone 75 mg nightly  Disposition: Discharge  Lucky Rathke, FNP 06/05/2021, 8:55 AM

## 2021-06-05 NOTE — ED Notes (Signed)
Patient alert and oriented X 4, patient denies SI, HI and AVH at time of discharge. Patient received all belongings from [facility] locker. Patient transported to [employer] via [employer] to obtain his personal vehicle.

## 2021-06-05 NOTE — Clinical Social Work Psych Note (Signed)
CSW DISCHARGE NOTE   CSW spoke with the patient regarding his discharge and any follow up services he would be interested in for continuity of care. Patient shared that he was ready for discharge, however was experiencing a slight increase of anxiety. Patient reports he is just anxious about returning home, however he felt as though he could "handle it".   The patient reported that he was returning home with his wife and that his car was parked at [facility]. RN was aware and have arranged for [employer].   Patient reports he follows up with Dr. Adele Schilder for medication management and that he is interested in being scheduled for therapy services as well.   Patient's follow up instructions have been listed in his AVS.   Patient denied having any SI, HI, or AVH at this time. Patient continued to be ready for discharge.    CSW will continue to follow until discharge.     Radonna Ricker, MSW, LCSW Clinical Education officer, museum ([place]) [facility]

## 2021-06-05 NOTE — Discharge Instructions (Addendum)

## 2021-06-05 NOTE — ED Notes (Signed)
D:  Patient A&Ox4. Denies intent to harm self/others when asked. Denies A/VH. Patient denies any physical complaints when asked. No acute distress noted.     A: Support and encouragement provided. Routine safety checks conducted according to facility protocol. Encouraged patient to notify staff if thoughts of harm toward self or others arise. Patient verbalize understanding and agreement.   R: Patient remains safe and patient verbally contracts for safety at this time. Will continue to monitor.

## 2021-06-05 NOTE — ED Provider Notes (Signed)
[facility] Discharge Suicide Risk Assessment   Principal Problem: <principal problem not specified> Discharge Diagnoses: Active Problems:   GAD (generalized anxiety disorder)   Total Time spent with patient: 15 minutes  Musculoskeletal: Strength & Muscle Tone: within normal limits Gait & Station: normal Patient leans: N/A  Psychiatric Specialty Exam  Presentation  General Appearance: Appropriate for Environment; Casual  Eye Contact:Good  Speech:Clear and Coherent; Normal Rate  Speech Volume:Normal  Handedness:Right   Mood and Affect  Mood:Anxious  Duration of Depression Symptoms: Less than two weeks  Affect:Congruent; Appropriate   Thought Process  Thought Processes:Coherent; Goal Directed  Descriptions of Associations:Intact  Orientation:Full (Time, Place and Person)  Thought Content:Logical; WDL  History of Schizophrenia/Schizoaffective disorder:No  Duration of Psychotic Symptoms:No data recorded Hallucinations:Hallucinations: None  Ideas of Reference:None  Suicidal Thoughts:Suicidal Thoughts: No  Homicidal Thoughts:Homicidal Thoughts: No   Sensorium  Memory:Immediate Good; Recent Good; Remote Good  Judgment:Good  Insight:Good   Executive Functions  Concentration:Good  Attention Span:Good  Oval of Knowledge:Good  Language:Good   Psychomotor Activity  Psychomotor Activity:Psychomotor Activity: Normal   Assets  Assets:Communication Skills; Desire for Improvement; Financial Resources/Insurance; Housing; Intimacy; Leisure Time; Physical Health; Resilience; Social Support; Talents/Skills; Transportation   Sleep  Sleep:Sleep: Fair   Physical Exam: Physical Exam Constitutional:      Appearance: Normal appearance. He is normal weight.  HENT:     Head: Normocephalic and atraumatic.  Eyes:     Extraocular Movements: Extraocular movements intact.  Pulmonary:     Effort: Pulmonary effort is normal.  Neurological:      Mental Status: He is alert.   Review of Systems  Constitutional:  Negative for chills and fever.  HENT:  Negative for hearing loss.   Eyes:  Negative for discharge and redness.  Respiratory:  Negative for cough.   Cardiovascular:  Negative for chest pain.  Gastrointestinal:  Negative for abdominal pain.  Musculoskeletal:  Negative for myalgias.  Neurological:  Negative for headaches.  Psychiatric/Behavioral:  Negative for depression and suicidal ideas. The patient is nervous/anxious.   Blood pressure 125/86, pulse 83, temperature 98.5 F (36.9 C), temperature source Oral, resp. rate 18, SpO2 100 %. There is no height or weight on file to calculate BMI.  Mental Status Per Nursing Assessment::   On Admission:     Demographic Factors:  Male  Loss Factors: Decline in physical health  Historical Factors: NA  Risk Reduction Factors:   Sense of responsibility to family, Living with another person, especially a relative, Positive social support, Positive therapeutic relationship, and Positive coping skills or problem solving skills  Continued Clinical Symptoms:  Severe Anxiety and/or Agitation  Cognitive Features That Contribute To Risk:  Thought constriction (tunnel vision)    Suicide Risk:  Minimal: No identifiable suicidal ideation.  Patients presenting with no risk factors but with morbid ruminations; may be classified as minimal risk based on the severity of the depressive symptoms   Follow-up [facility]. Schedule an appointment as soon as possible for a visit .   Specialty: Behavioral Health Why: Your medication management appointment with Dr. Adele Schilder is 06/17/2021 at 8:40am. Receptionist requested for patient to contact main office to establish outpatient therapy services. Please contact main office for any additional questions or concenrs. Contact information: [place] [phone]                Plan Of Care/Follow-up recommendations:  Activity:  as tolerated Diet:  regular Other:     Patient is instructed prior to discharge to: Take all medications as prescribed by his/her mental healthcare provider. Report any adverse effects and or reactions from the medicines to his/her outpatient provider promptly. Patient has been instructed & cautioned: To not engage in alcohol and or illegal drug use while on prescription medicines. In the event of worsening symptoms, patient is instructed to call the crisis hotline, 911 and or go to the nearest ED for appropriate evaluation and treatment of symptoms. To follow-up with his/her primary care provider for your other medical issues, concerns and or health care needs.     Ival Bible, MD 06/05/2021, 10:58 AM

## 2021-06-07 ENCOUNTER — Encounter (HOSPITAL_COMMUNITY): Payer: Self-pay | Admitting: Psychiatry

## 2021-06-07 ENCOUNTER — Other Ambulatory Visit: Payer: Self-pay

## 2021-06-07 ENCOUNTER — Telehealth (INDEPENDENT_AMBULATORY_CARE_PROVIDER_SITE_OTHER): Payer: Medicare HMO | Admitting: Psychiatry

## 2021-06-07 VITALS — Wt 204.0 lb

## 2021-06-07 DIAGNOSIS — F411 Generalized anxiety disorder: Secondary | ICD-10-CM | POA: Diagnosis not present

## 2021-06-07 DIAGNOSIS — F410 Panic disorder [episodic paroxysmal anxiety] without agoraphobia: Secondary | ICD-10-CM

## 2021-06-07 MED ORDER — TRAZODONE HCL 150 MG PO TABS: 150.0000 mg | ORAL_TABLET | Freq: Every day | ORAL | 0 refills | Status: DC

## 2021-06-07 MED ORDER — CLONAZEPAM 0.5 MG PO TABS: 0.5000 mg | ORAL_TABLET | Freq: Two times a day (BID) | ORAL | 0 refills | Status: DC

## 2021-06-07 NOTE — Progress Notes (Signed)
Virtual Visit via Telephone Note  I connected with [name] Itzkowitz Sr. on 06/07/21 at 11:20 AM EDT by telephone and verified that I am speaking with the correct person using two identifiers.  Location: Patient: Home  Provider: Home Office   I discussed the limitations, risks, security and privacy concerns of performing an evaluation and management service by telephone and the availability of in person appointments. I also discussed with the patient that there may be a patient responsible charge related to this service. The patient expressed understanding and agreed to proceed.   History of Present Illness: Patient requested an earlier appointment.  He was in the emergency room last week because of increased anxiety, nervousness and having panic attacks.  Patient described he was having a lot of shakes, sweating and started to have passive suicidal thoughts.  He is not sure what triggered but maybe he felt the Trintellix causing it.  He was taking 10 mg but in the emergency room he did admit that he increased to 15 mg on his own.  Patient also reported some conflicting symptoms as in the beginning he reported Trintellix helped so increase the dose but later he told the Trintellix caused side effects.  He is no longer taking Trintellix and he was discharged on BuSpar, hydroxyzine and Cymbalta however he stopped taking it because it was causing him more emotional, stomach issues, breathing issues and he did not sleep.  His last day of these medication were yesterday.  He is not sleeping well and only getting 3 to 4 hours sleep.  He is taking Ativan 0.5 mg twice a day but he does not feel it is helping him as much.  He still have anxiety but denies any major panic attack, crying spells or any suicidal thoughts.  He had a support from his wife.  Denies any mania or psychosis.  Like to try a different medication to help his anxiety.  He is taking trazodone but only 75 mg which only helps in 3 to 4 hours  sleep.  Patient admitted not seeing a therapist but now willing to see someone on a regular basis.  His previous therapist Dr. Aubery Lapping was difficult to get hold and sometimes he only see once a month.  Past psychiatric history; H/O anxiety since early 12s.  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  No h/o mania, psychosis, suicidal attempt, inpatient treatment.  Seen and managed by PCP since early 48s. Tried Remeron, hydroxyzine, gabapentin, Paxil, Prozac, Zoloft and Lamictal.  Stopped due to tremors or sexual side effects.   Recent Results (from the past 2160 hour(s))  Resp Panel by RT-PCR (Flu A&B, Covid) Nasopharyngeal Swab     Status: None   Collection Time: 06/03/21 11:06 AM   Specimen: Nasopharyngeal Swab; Nasopharyngeal(NP) swabs in vial transport medium  Result Value Ref Range   SARS Coronavirus 2 by RT PCR NEGATIVE NEGATIVE    Comment: (NOTE) SARS-CoV-2 target nucleic acids are NOT DETECTED.  The SARS-CoV-2 RNA is generally detectable in upper respiratory specimens during the acute phase of infection. The lowest concentration of SARS-CoV-2 viral copies this assay can detect is 138 copies/mL. A negative result does not preclude SARS-Cov-2 infection and should not be used as the sole basis for treatment or other patient management decisions. A negative result may occur with  improper specimen collection/handling, submission of specimen other than nasopharyngeal swab, presence of viral mutation(s) within the areas targeted by this assay, and inadequate  number of viral copies(<138 copies/mL). A negative result must be combined with clinical observations, patient history, and epidemiological information. The expected result is Negative.  Fact Sheet for Patients:  EntrepreneurPulse.com.au  Fact Sheet for Healthcare Providers:  IncredibleEmployment.be  This test is no t yet approved or cleared by the [place] FDA and   has been authorized for detection and/or diagnosis of SARS-CoV-2 by FDA under an Emergency Use Authorization (EUA). This EUA will remain  in effect (meaning this test can be used) for the duration of the COVID-19 declaration under Section 564(b)(1) of the Act, 21 U.S.C.section 360bbb-3(b)(1), unless the authorization is terminated  or revoked sooner.       Influenza A by PCR NEGATIVE NEGATIVE   Influenza B by PCR NEGATIVE NEGATIVE    Comment: (NOTE) The Xpert Xpress SARS-CoV-2/FLU/RSV plus assay is intended as an aid in the diagnosis of influenza from Nasopharyngeal swab specimens and should not be used as a sole basis for treatment. Nasal washings and aspirates are unacceptable for Xpert Xpress SARS-CoV-2/FLU/RSV testing.  Fact Sheet for Patients: EntrepreneurPulse.com.au  Fact Sheet for Healthcare Providers: IncredibleEmployment.be  This test is not yet approved or cleared by the [place] FDA and has been authorized for detection and/or diagnosis of SARS-CoV-2 by FDA under an Emergency Use Authorization (EUA). This EUA will remain in effect (meaning this test can be used) for the duration of the COVID-19 declaration under Section 564(b)(1) of the Act, 21 U.S.C. section 360bbb-3(b)(1), unless the authorization is terminated or revoked.  Performed at [facility], [place] [address] [place], [place] [address]   Comprehensive metabolic panel     Status: None   Collection Time: 06/03/21 11:06 AM  Result Value Ref Range   Sodium 139 135 - 145 mmol/L   Potassium 3.8 3.5 - 5.1 mmol/L   Chloride 106 98 - 111 mmol/L   CO2 24 22 - 32 mmol/L   Glucose, Bld 92 70 - 99 mg/dL    Comment: Glucose reference range applies only to samples taken after fasting for at least 8 hours.   BUN 8 6 - 20 mg/dL   Creatinine, Ser 0.76 0.61 - 1.24 mg/dL   Calcium 9.3 8.9 - 10.3 mg/dL   Total Protein 7.1 6.5 - 8.1 g/dL   Albumin 4.2 3.5 -  5.0 g/dL   AST 26 15 - 41 U/L   ALT 30 0 - 44 U/L   Alkaline Phosphatase 66 38 - 126 U/L   Total Bilirubin 0.7 0.3 - 1.2 mg/dL   GFR, Estimated >60 >60 mL/min    Comment: (NOTE) Calculated using the CKD-EPI Creatinine Equation (2021)    Anion gap 9 5 - 15    Comment: Performed at [facility], [place] [address], [place], [place] [address]  Ethanol     Status: None   Collection Time: 06/03/21 11:06 AM  Result Value Ref Range   Alcohol, Ethyl (B) <10 <10 mg/dL    Comment: (NOTE) Lowest detectable limit for serum alcohol is 10 mg/dL.  For medical purposes only. Performed at [facility], [place] [address] [place], [place] [address]   CBC with Diff     Status: Abnormal   Collection Time: 06/03/21 11:06 AM  Result Value Ref Range   WBC 7.5 4.0 - 10.5 K/uL   RBC 4.56 4.22 - 5.81 MIL/uL   Hemoglobin 12.6 (L) 13.0 - 17.0 g/dL   HCT 39.1 39.0 - 52.0 %   MCV 85.7 80.0 -  100.0 fL   MCH 27.6 26.0 - 34.0 pg   MCHC 32.2 30.0 - 36.0 g/dL   RDW 14.3 11.5 - 15.5 %   Platelets 273 150 - 400 K/uL   nRBC 0.0 0.0 - 0.2 %   Neutrophils Relative % 60 %   Neutro Abs 4.5 1.7 - 7.7 K/uL   Lymphocytes Relative 32 %   Lymphs Abs 2.4 0.7 - 4.0 K/uL   Monocytes Relative 7 %   Monocytes Absolute 0.5 0.1 - 1.0 K/uL   Eosinophils Relative 1 %   Eosinophils Absolute 0.1 0.0 - 0.5 K/uL   Basophils Relative 0 %   Basophils Absolute 0.0 0.0 - 0.1 K/uL   Immature Granulocytes 0 %   Abs Immature Granulocytes 0.02 0.00 - 0.07 K/uL    Comment: Performed at [name], [place] [address] [place], [place] [address]  Urine rapid drug screen (hosp performed)     Status: None   Collection Time: 06/03/21 11:32 AM  Result Value Ref Range   Opiates NONE DETECTED NONE DETECTED   Cocaine NONE DETECTED NONE DETECTED   Benzodiazepines NONE DETECTED NONE DETECTED   Amphetamines NONE DETECTED NONE DETECTED   Tetrahydrocannabinol NONE DETECTED NONE DETECTED    Barbiturates NONE DETECTED NONE DETECTED    Comment: (NOTE) DRUG SCREEN FOR MEDICAL PURPOSES ONLY.  IF CONFIRMATION IS NEEDED FOR ANY PURPOSE, NOTIFY LAB WITHIN 5 DAYS.  LOWEST DETECTABLE LIMITS FOR URINE DRUG SCREEN Drug Class                     Cutoff (ng/mL) Amphetamine and metabolites    1000 Barbiturate and metabolites    200 Benzodiazepine                 [id number] Tricyclics and metabolites     300 Opiates and metabolites        300 Cocaine and metabolites        300 THC                            50 Performed at [facility], [place] [address] [place], [place] [address]      Psychiatric Specialty Exam: Physical Exam  Review of Systems  Weight 204 lb (92.5 kg).Body mass index is 29.27 kg/m.  General Appearance: NA  Eye Contact:  NA  Speech:  Slow  Volume:  Decreased  Mood:  Anxious  Affect:  NA  Thought Process:  Descriptions of Associations: Intact  Orientation:  Full (Time, Place, and Person)  Thought Content:  Rumination  Suicidal Thoughts:  No  Homicidal Thoughts:  No  Memory:  Immediate;   Good Recent;   Good Remote;   Good  Judgement:  Fair  Insight:  Shallow  Psychomotor Activity:  NA  Concentration:  Concentration: Fair and Attention Span: Fair  Recall:  Good  Fund of Knowledge:  Good  Language:  Good  Akathisia:  No  Handed:  Right  AIMS (if indicated):     Assets:  Communication Skills Desire for Improvement Housing Social Support  ADL's:  Intact  Cognition:  WNL  Sleep:   3 hrs      Assessment and Plan: Generalized anxiety disorder.  Panic attacks.  I review notes and lab results.  His UDS was negative for benzodiazepines even though he reported that he is taking the Ativan twice a day.  He is no longer taking Trintellix.  He is worried about still having some side effects of withdrawal from Trintellix.  Reassurance given.  He is not taking Cymbalta, BuSpar and hydroxyzine which was given at the discharge from the  hospital.  He talked in length about other medications but patient still nervous and anxious.  Apparently he agreed to switch his Ativan to Klonopin to have a longer half-life.  We will start Klonopin 0.5 mg twice a day and I also recommend to increase trazodone 150 mg at bedtime.  Patient was requested to call us back in a week and keep the appointment in 2 weeks.  I also encouraged him to see a therapist for coping skills.  He was instructed not to take Ativan anymore.  We may need to consider starting him on tricyclics as patient has tried SSRIs, mirtazapine, gabapentin and Lamictal with limited outcome.  Discussed safety concerns and anytime having active suicidal thoughts or homicidal halogen need to call 911 or go to local emergency room.  Follow Up Instructions:    I discussed the assessment and treatment plan with the patient. The patient was provided an opportunity to ask questions and all were answered. The patient agreed with the plan and demonstrated an understanding of the instructions.   The patient was advised to call back or seek an in-person evaluation if the symptoms worsen or if the condition fails to improve as anticipated.  I provided 30 minutes of non-face-to-face time during this encounter.   [name], MD

## 2021-06-11 ENCOUNTER — Telehealth (HOSPITAL_COMMUNITY): Payer: Self-pay | Admitting: *Deleted

## 2021-06-11 DIAGNOSIS — F411 Generalized anxiety disorder: Secondary | ICD-10-CM

## 2021-06-11 DIAGNOSIS — F410 Panic disorder [episodic paroxysmal anxiety] without agoraphobia: Secondary | ICD-10-CM

## 2021-06-11 MED ORDER — LORAZEPAM 0.5 MG PO TABS: 0.5000 mg | ORAL_TABLET | Freq: Three times a day (TID) | ORAL | 0 refills | Status: DC

## 2021-06-11 MED ORDER — DESVENLAFAXINE SUCCINATE ER 25 MG PO TB24: 25.0000 mg | ORAL_TABLET | Freq: Every day | ORAL | 0 refills | Status: DC

## 2021-06-11 NOTE — Telephone Encounter (Signed)
Pt called stating that he took Klonopin and shortly after began experiencing "shaking all over and ahving muscle spasms". Pt says he is ruminating all the time, can't sleep, constantly feels anxious. Pt has an appointment with therapist in office on 06/17/21; Darren. Pt would like to speak to you directly today. Thanks.

## 2021-06-11 NOTE — Telephone Encounter (Signed)
I return patient's phone call.  I also reviewed was [place] results.  He can try amitriptyline, nortriptyline, Pristiq, imipramine, Effexor, Viibryd.  Even though Trintellix is a favorable section he developed side effects.  He did not like the Klonopin because he felt shakes all over the body.  We will discontinue Klonopin and restart Ativan 0.5 mg to take up to 3 times a day and in the meantime we will try Pristiq 25 mg daily.  He understand that benzodiazepine can cause dependency and tolerance.  He used to take Ativan 2 mg up to 2-3 times a day but gradually dose has been cut down.  Patient understand that he will give the medication some time to work.  Encouraged to call us back if he has any question, concern or if he feels worsening of the symptoms.  Prescription called into his pharmacy.

## 2021-06-17 ENCOUNTER — Other Ambulatory Visit: Payer: Self-pay

## 2021-06-17 ENCOUNTER — Ambulatory Visit (INDEPENDENT_AMBULATORY_CARE_PROVIDER_SITE_OTHER): Payer: Medicare HMO | Admitting: Clinical

## 2021-06-17 DIAGNOSIS — F411 Generalized anxiety disorder: Secondary | ICD-10-CM | POA: Diagnosis not present

## 2021-06-17 DIAGNOSIS — F410 Panic disorder [episodic paroxysmal anxiety] without agoraphobia: Secondary | ICD-10-CM | POA: Diagnosis not present
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
## 2021-06-19 ENCOUNTER — Other Ambulatory Visit: Payer: Self-pay

## 2021-06-19 ENCOUNTER — Encounter: Payer: Self-pay | Admitting: Neurology

## 2021-06-19 ENCOUNTER — Ambulatory Visit: Payer: Medicare HMO | Admitting: Neurology

## 2021-06-19 VITALS — BP 124/84 | HR 105 | Ht 70.0 in | Wt 197.0 lb

## 2021-06-19 DIAGNOSIS — R799 Abnormal finding of blood chemistry, unspecified: Secondary | ICD-10-CM | POA: Diagnosis not present

## 2021-06-19 DIAGNOSIS — R251 Tremor, unspecified: Secondary | ICD-10-CM | POA: Diagnosis not present

## 2021-06-19 DIAGNOSIS — E538 Deficiency of other specified B group vitamins: Secondary | ICD-10-CM | POA: Diagnosis not present

## 2021-06-19 DIAGNOSIS — E079 Disorder of thyroid, unspecified: Secondary | ICD-10-CM | POA: Diagnosis not present

## 2021-06-19 DIAGNOSIS — F418 Other specified anxiety disorders: Secondary | ICD-10-CM | POA: Diagnosis not present

## 2021-06-19 DIAGNOSIS — R748 Abnormal levels of other serum enzymes: Secondary | ICD-10-CM | POA: Diagnosis not present

## 2021-06-19 DIAGNOSIS — R7989 Other specified abnormal findings of blood chemistry: Secondary | ICD-10-CM | POA: Diagnosis not present

## 2021-06-19 DIAGNOSIS — R7982 Elevated C-reactive protein (CRP): Secondary | ICD-10-CM | POA: Diagnosis not present

## 2021-06-19 NOTE — Progress Notes (Signed)
Chief Complaint  Patient presents with   New Patient (Initial Visit)    New room, with wife, Internal referral for Tremors, new onset severe headaches, Reports full boday tremors, c/o daily headaches, states he wake up with them       ASSESSMENT AND PLAN  [name] Deluke Sr. is a 60 y.o. male  Constellation of complaints,  Including intermittent body tremor, in the setting of worsening depression anxiety,  No focal symptoms or signs on examinations,  MRI of the brain to rule out structural abnormality  Laboratory evaluation to rule out thyroid malfunction  Continue follow-up with his psychiatrist, and primary care physician, will call him result   DIAGNOSTIC DATA (LABS, IMAGING, TESTING) - I reviewed patient records, labs, notes, testing and imaging myself where available.   MEDICAL HISTORY:  [name] [name]. is a 60 year old male, seen in request by his psychiatrist Dr. For eval Dr. Adele Schilder, [facility] for evaluation of constellation of complaints, body tremor, worsening anxiety, he is accompanied by his primary care physician Dr. Dorthy Cooler, [place], initial evaluation was on June 19, 2021 with his wife   I reviewed and summarized the referring note. PMHx. HTN Depression anxiety, since 2021,  only taking ativan 0.'5mg'$  tid prn. Cervical decompression surgery,  Adrenal gland nodule, intestine surgery, deep depression 2018.  He reported history of depression since 2011, went on disability since then, also due to his previous cervical decompression surgery for cervical radiculopathy,  His depression has worsened since abdominal surgery in 2018, initially for adrenal gland surgery, complicated by intestinal injury per patient, required multiple abdominal surgery later  He was diagnosed with depression anxiety, over the years, he was treated with different medications, most recent one was Trintellix, around April 2022, he noticed intermittent body tremor, worsening anxiety, could not  keep himself calm,  Trintellix was stopped, now he is taking Ativan 0.5 mg 3-4 times daily even more each day, is no longer helpful, he described difficulty keeping calm,   PHYSICAL EXAM:   Vitals:   06/19/21 1040  BP: 124/84  Pulse: (!) 105  Weight: 197 lb (89.4 kg)  Height: '5\' 10"'$  (1.778 m)   Not recorded     Body mass index is 28.27 kg/m.  PHYSICAL EXAMNIATION:  Gen: NAD, conversant, well nourised, well groomed                     Cardiovascular: Regular rate rhythm, no peripheral edema, warm, nontender. Eyes: Conjunctivae clear without exudates or hemorrhage Neck: Supple, no carotid bruits. Pulmonary: Clear to auscultation bilaterally   NEUROLOGICAL EXAM: Depressed looking middle-aged male  MENTAL STATUS: Speech:    Speech is normal; fluent and spontaneous with normal comprehension.  Cognition:     Orientation to time, place and person     Normal recent and remote memory     Normal Attention span and concentration     Normal Language, naming, repeating,spontaneous speech     Fund of knowledge   CRANIAL NERVES: CN II: Visual fields are full to confrontation. Pupils are round equal and briskly reactive to light. CN III, IV, VI: extraocular movement are normal. No ptosis. CN V: Facial sensation is intact to light touch CN VII: Face is symmetric with normal eye closure  CN VIII: Hearing is normal to causal conversation. CN IX, X: Phonation is normal. CN XI: Head turning and shoulder shrug are intact  MOTOR: There is no pronator drift of out-stretched arms. Muscle bulk and tone are normal.  Muscle strength is normal.  REFLEXES: Reflexes are 2+ and symmetric at the biceps, triceps, knees, and ankles. Plantar responses are flexor.  SENSORY: Intact to light touch, pinprick and vibratory sensation are intact in fingers and toes.  COORDINATION: There is no trunk or limb dysmetria noted.  GAIT/STANCE: Posture is normal. Gait is steady with normal steps, base, arm  swing, and turning. Heel and toe walking are normal. Tandem gait is normal.  Romberg is absent.  REVIEW OF SYSTEMS:  Full 14 system review of systems performed and notable only for as above All other review of systems were negative.   ALLERGIES: Allergies  Allergen Reactions   Chantix [Varenicline] Palpitations    Heart race Increased panic attacks   Morphine Itching   Penicillins Rash    Has patient had a PCN reaction causing immediate rash, facial/tongue/throat swelling, SOB or lightheadedness with hypotension: Yes Has patient had a PCN reaction causing severe rash involving mucus membranes or skin necrosis: Unknown Has patient had a PCN reaction that required hospitalization: No Has patient had a PCN reaction occurring within the last 10 years: No If all of the above answers are "NO", then may proceed with Cephalosporin use. Tolerating Zosyn 05/2017     HOME MEDICATIONS: Current Outpatient Medications  Medication Sig Dispense Refill   amLODipine (NORVASC) 10 MG tablet Take 10 mg by mouth daily.      betamethasone dipropionate 0.05 % cream Apply 1 application topically 2 (two) times daily as needed (rash).     cyclobenzaprine (FLEXERIL) 10 MG tablet Take 0.5-1 tablets (5-10 mg total) by mouth 3 (three) times daily as needed for muscle spasms. 20 tablet 0   Desvenlafaxine Succinate ER (PRISTIQ) 25 MG TB24 Take 25 mg by mouth daily. 30 tablet 0   DULoxetine (CYMBALTA) 30 MG capsule Take 1 capsule (30 mg total) by mouth daily. 30 capsule 0   famotidine (PEPCID) 40 MG tablet Take 20 mg by mouth 2 (two) times daily.     hydrochlorothiazide (HYDRODIURIL) 12.5 MG tablet Take 3.125 mg by mouth daily.      hydrOXYzine (ATARAX/VISTARIL) 25 MG tablet Take 1 tablet (25 mg total) by mouth 3 (three) times daily as needed for anxiety. 30 tablet 0   ibuprofen (ADVIL) 200 MG tablet Take 600 mg by mouth every 6 (six) hours as needed for moderate pain.     LORazepam (ATIVAN) 0.5 MG tablet Take 1  tablet (0.5 mg total) by mouth 3 (three) times daily. 90 tablet 0   Multiple Vitamin (MULTIVITAMIN WITH MINERALS) TABS Take 1 tablet by mouth daily.     oxyCODONE-acetaminophen (PERCOCET) 10-325 MG tablet Take 1 tablet by mouth every 6 (six) hours as needed for pain.     Probiotic Product (PROBIOTIC PO) Take 1 capsule by mouth daily.     psyllium (METAMUCIL) 58.6 % packet Take 1 packet by mouth every other day.     sildenafil (VIAGRA) 100 MG tablet Take 100 mg by mouth daily as needed for erectile dysfunction.     telmisartan (MICARDIS) 80 MG tablet Take 80 mg by mouth at bedtime.     traZODone (DESYREL) 150 MG tablet Take 1 tablet (150 mg total) by mouth at bedtime. 30 tablet 0   No current facility-administered medications for this visit.    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   Erythrocytosis 11/30/2014   Essential hypertension    GERD (gastroesophageal reflux disease)    Headache(784.0)    2 x/week   History of kidney stones    Hypothyroidism    hx of    Leukocytosis, unspecified 11/03/2013   Limited joint range of motion    cervical fusion 02/2013   Medial meniscus tear 06/2013   left knee   Pheochromocytoma, right, 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 06/27/2017   Throat and mouth symptom    itchy throat x 3 weeks   Thyroid nodule    Tobacco abuse    Wears partial dentures    upper    PAST SURGICAL HISTORY: Past Surgical History:  Procedure Laterality Date   ABDOMINAL SURGERY     ANTERIOR CERVICAL DECOMP/DISCECTOMY FUSION N/A 03/18/2013   Procedure: ANTERIOR CERVICAL DECOMPRESSION/DISCECTOMY FUSION 3 LEVELS;  Surgeon: Erline Levine, MD;  Location: [facility];  Service: Neurosurgery;  Laterality: N/A;  Anterior Cervical Three-Six  Decompression/Diskectomy/Fusion   APPENDECTOMY     CIRCUMCISION  05/04/2006   COLONOSCOPY      CYSTOSCOPY/URETEROSCOPY/HOLMIUM LASER/STENT PLACEMENT Left 11/24/2018   Procedure: CYSTOSCOPY/URETEROSCOPY/HOLMIUM LASER/STENT PLACEMENT;  Surgeon: Lucas Mallow, MD;  Location: [facility];  Service: Urology;  Laterality: Left;   DECOMPRESSIVE LUMBAR LAMINECTOMY LEVEL 1 Right 01/11/2020   Procedure: RIGHT LUMBAR FOUR-FIVE MINIMALLY INVASIVE LUMBAR DISCECTOMY;  Surgeon: Judith Part, MD;  Location: [place];  Service: Neurosurgery;  Laterality: Right;   EYE SURGERY     bilateral cataract surgery    HEMICOLECTOMY Right [id number]   with ileocolic anastomosis   INGUINAL HERNIA REPAIR     INSERTION OF MESH N/A 07/31/2017   Procedure: INSERTION OF MESH;  Surgeon: Johnathan Hausen, MD;  Location: [facility];  Service: General;  Laterality: N/A;   KNEE ARTHROSCOPY Left 08/01/2013   Procedure: LEFT ARTHROSCOPY KNEE;  Surgeon: Kerin Salen, MD;  Location: [facility];  Service: Orthopedics;  Laterality: Left;   LAPAROSCOPIC ADRENALECTOMY Right 05/06/2017   Procedure: OPEN RIGHT ADRENALECTOMY WITH SMALL BOWEL RESECTION AND ANASTOMOSIS;  Surgeon: Kieth Brightly, Arta Bruce, MD;  Location: [facility];  Service: General;  Laterality: Right;   LAPAROSCOPIC LYSIS OF ADHESIONS N/A 07/31/2017   Procedure: LYSIS OF ADHESIONS;  Surgeon: Johnathan Hausen, MD;  Location: [facility];  Service: General;  Laterality: N/A;   LAPAROSCOPY N/A 07/31/2017   Procedure: LAPAROSCOPY DIAGNOSTIC;  Surgeon: Johnathan Hausen, MD;  Location: [facility];  Service: General;  Laterality: N/A;   LAPAROTOMY N/A 05/13/2017   Procedure: EXPLORATORY LAPAROTOMY, with wound dehisense, and repair of anastamotic leak;  Surgeon: Mickeal Skinner, MD;  Location: [facility];  Service: General;  Laterality: N/A;   LAPAROTOMY N/A 05/14/2017   Procedure: EXPLORATORY LAPAROTOMY, LYSIS OF ADHESIONS/ SMALL BOWEL RESECTION/ WASHOUT OF ABDOMEN AND PLACEMENT OF NEGATIVE PRESSURE DRESSING;  Surgeon: Kieth Brightly, Arta Bruce, MD;  Location: [facility];   Service: General;  Laterality: N/A;   LAPAROTOMY N/A 05/17/2017   Procedure: EXPLORATORY LAPAROTOMY, PLACEMENT OF ILEOSTOMY TUBES, PARTIAL CLOSURE OF FASCIA, WOUND VAC EXCHANGE;  Surgeon: [name], Arta Bruce, MD;  Location: [facility];  Service: General;  Laterality: N/A;   LAPAROTOMY N/A 05/20/2017   Procedure: ABDOMINAL WOUND [place] OUT, WOUND CLOSURE AND WOUND VAC PLACEMENT;  Surgeon: [name], Arta Bruce, MD;  Location: [facility];  Service: General;  Laterality: N/A;   LAPAROTOMY N/A 07/31/2017   Procedure: EXPLORATORY LAPAROTOMY WITH CLOSURE OF ENTEROTOMICS;  Surgeon: Johnathan Hausen, MD;  Location: [facility];  Service:  General;  Laterality: N/A;   SHOULDER ARTHROSCOPY W/ ROTATOR CUFF REPAIR Left 07/16/2010   SMALL INTESTINE SURGERY  04/24/2000   adhesiolysis, ileocolic anastomosis   UPPER GASTROINTESTINAL ENDOSCOPY      FAMILY HISTORY: Family History  Problem Relation Age of Onset   Colon cancer Father 71   Breast cancer Sister 36   Thyroid disease Neg Hx     SOCIAL HISTORY: Social History   Socioeconomic History   Marital status: Married    Spouse name: Not on file   Number of children: Not on file   Years of education: Not on file   Highest education level: Not on file  Occupational History   Not on file  Tobacco Use   Smoking status: Former    Packs/day: 1.00    Years: 25.00    Pack years: 25.00    Types: Cigarettes    Quit date: 2017    Years since quitting: 5.6   Smokeless tobacco: Never   Tobacco comments:    quit smoking 3 years ago  Vaping Use   Vaping Use: Never used  Substance and Sexual Activity   Alcohol use: Yes    Comment: occasionally   Drug use: No   Sexual activity: Not on file  Other Topics Concern   Not on file  Social History Narrative   Lives in [place] with his wife.   Social Determinants of Health   Financial Resource Strain: Not on file  Food Insecurity: Not on file  Transportation Needs: Not on file  Physical Activity: Not on file  Stress: Not  on file  Social Connections: Not on file  Intimate Partner Violence: Not on file      Marcial Pacas, M.D. Ph.D.  [facility] [address] [place], [place] [address] Ph: [phone] Fax: [phone]  CC:  Lujean Amel, MD [place],  [place] [id number]  Lujean Amel, MD

## 2021-06-20 ENCOUNTER — Telehealth: Payer: Self-pay | Admitting: Neurology

## 2021-06-20 LAB — THYROID PANEL WITH TSH
Free Thyroxine Index: 1.5 (ref 1.2–4.9)
T3 Uptake Ratio: 24 % (ref 24–39)
T4, Total: 6.1 ug/dL (ref 4.5–12.0)
TSH: 0.494 u[IU]/mL (ref 0.450–4.500)

## 2021-06-20 LAB — SEDIMENTATION RATE: Sed Rate: 2 mm/hr (ref 0–30)

## 2021-06-20 LAB — C-REACTIVE PROTEIN: CRP: <1 mg/L (ref 0–10)

## 2021-06-20 LAB — CK: Total CK: 95 U/L (ref 41–331)

## 2021-06-20 LAB — VITAMIN B12: Vitamin B-12: 77 pg/mL — ABNORMAL LOW (ref 232–1245)

## 2021-06-20 NOTE — Telephone Encounter (Signed)
[employer] pending uploaded notes on the portal  

## 2021-06-20 NOTE — Telephone Encounter (Addendum)
His B12 was significantly low at 77. Per vo by Dr. Krista Blue, he would benefit from B12 IM injections. She would like him to follow up with his PCP to address treatment and monitoring of this B12 deficiency. No issues with the other labs. He verbalized understanding and will contact Dr. Dorthy Cooler for an appt. His lab results have been forward to that office.

## 2021-06-20 NOTE — Telephone Encounter (Signed)
Mcarthur Rossetti Josem Kaufmann: [id number] (exp. 06/20/21 to 07/20/21) order sent to GI. They will reach out to the patient to schedule.

## 2021-06-24 DIAGNOSIS — E538 Deficiency of other specified B group vitamins: Secondary | ICD-10-CM | POA: Diagnosis not present

## 2021-06-25 DIAGNOSIS — M25551 Pain in right hip: Secondary | ICD-10-CM | POA: Diagnosis not present

## 2021-06-25 DIAGNOSIS — M542 Cervicalgia: Secondary | ICD-10-CM | POA: Diagnosis not present

## 2021-06-25 DIAGNOSIS — I10 Essential (primary) hypertension: Secondary | ICD-10-CM | POA: Diagnosis not present

## 2021-06-25 DIAGNOSIS — M5416 Radiculopathy, lumbar region: Secondary | ICD-10-CM | POA: Diagnosis not present

## 2021-06-27 ENCOUNTER — Ambulatory Visit (INDEPENDENT_AMBULATORY_CARE_PROVIDER_SITE_OTHER): Payer: Medicare HMO | Admitting: Clinical

## 2021-06-27 ENCOUNTER — Other Ambulatory Visit: Payer: Self-pay

## 2021-06-27 DIAGNOSIS — F411 Generalized anxiety disorder: Secondary | ICD-10-CM | POA: Diagnosis not present

## 2021-06-27 DIAGNOSIS — F410 Panic disorder [episodic paroxysmal anxiety] without agoraphobia: Secondary | ICD-10-CM | POA: Diagnosis not present

## 2021-06-27 NOTE — Progress Notes (Signed)
   THERAPIST PROGRESS NOTE  Session Time: 10am  Participation Level: Active  Behavioral Response: NAAlertAnxious  Type of Therapy: Individual Therapy  Treatment Goals addressed: Anxiety  Interventions: CBT Virtual Visit via Telephone Note  I connected with [name] [name] on 06/27/21 at 10:00 AM EDT by telephone and verified that I am speaking with the correct person using two identifiers.  Location: Patient: home Provider: office   I discussed the limitations, risks, security and privacy concerns of performing an evaluation and management service by telephone and the availability of in person appointments. I also discussed with the patient that there may be a patient responsible charge related to this service. The patient expressed understanding and agreed to proceed.   I discussed the assessment and treatment plan with the patient. The patient was provided an opportunity to ask questions and all were answered. The patient agreed with the plan and demonstrated an understanding of the instructions.   The patient was advised to call back or seek an in-person evaluation if the symptoms worsen or if the condition fails to improve as anticipated.  I provided 50 minutes of non-face-to-face time during this encounter.   Summary: [name] Cowher Sr. is a 60 y.o. male who reports a hx of anxiety. Pt discussed having physical health problems that he suspects may be contributing to his increased anxiety. Pt reports recently going to the doctor and learning that his B12 was extremely low. Pt says he began receiving B12 shots on 8/29 and will receive them 1x per week for 4 weeks. Pt endorses shortness of breath, mood swings, lightheaded. Pt explained having "whitecoat syndrome" stating medical officials not being as attentive to his healthcare needs.  Suicidal/Homicidal: Pt denies SI/HI no plan, attempt or intent to harm self or others reported.  Therapist Response: CSW probed for feedback on  anxiety producing triggers.CSW reviewed and provided pt with information on cycle of anxiety and challenging negative thoughts. CSW discussed with pt putting thoughts on trial to challenge irrational beliefs. Homework=practicing putting thoughts on trial, furhter discussed next session.  Plan: Return again in 1 weeks.  Diagnosis: Axis I: Generalized anxiety disorder    Panic attack    Axis II: No diagnosis    Yvette Rack, LCSW 06/27/2021

## 2021-07-02 DIAGNOSIS — E538 Deficiency of other specified B group vitamins: Secondary | ICD-10-CM | POA: Diagnosis not present

## 2021-07-05 ENCOUNTER — Other Ambulatory Visit: Payer: Self-pay

## 2021-07-05 ENCOUNTER — Ambulatory Visit
Admission: RE | Admit: 2021-07-05 | Discharge: 2021-07-05 | Disposition: A | Discharge status: Home / Self Care | Payer: Medicare HMO | Source: Ambulatory Visit | Attending: Neurology | Admitting: Neurology

## 2021-07-05 DIAGNOSIS — F418 Other specified anxiety disorders: Secondary | ICD-10-CM

## 2021-07-05 DIAGNOSIS — R251 Tremor, unspecified: Secondary | ICD-10-CM | POA: Diagnosis not present

## 2021-07-05 DIAGNOSIS — R413 Other amnesia: Secondary | ICD-10-CM | POA: Diagnosis not present

## 2021-07-09 DIAGNOSIS — E538 Deficiency of other specified B group vitamins: Secondary | ICD-10-CM | POA: Diagnosis not present

## 2021-07-11 ENCOUNTER — Ambulatory Visit (INDEPENDENT_AMBULATORY_CARE_PROVIDER_SITE_OTHER): Payer: Medicare HMO | Admitting: Clinical

## 2021-07-11 ENCOUNTER — Other Ambulatory Visit: Payer: Self-pay

## 2021-07-11 DIAGNOSIS — F411 Generalized anxiety disorder: Secondary | ICD-10-CM

## 2021-07-11 DIAGNOSIS — F410 Panic disorder [episodic paroxysmal anxiety] without agoraphobia: Secondary | ICD-10-CM | POA: Diagnosis not present

## 2021-07-11 NOTE — Progress Notes (Signed)
   THERAPIST PROGRESS NOTE  Session Time: 11am  Participation Level: Active  Behavioral Response: NAAlertAnxious  Type of Therapy: Individual Therapy  Treatment Goals addressed: Anxiety and Coping  Interventions: CBT Virtual Visit via Telephone Note  I connected with [name] on 07/11/21 at 11:00 AM EDT by telephone and verified that I am speaking with the correct person using two identifiers.  Location: Patient: home Provider: office   I discussed the limitations, risks, security and privacy concerns of performing an evaluation and management service by telephone and the availability of in person appointments. I also discussed with the patient that there may be a patient responsible charge related to this service. The patient expressed understanding and agreed to proceed.   I discussed the assessment and treatment plan with the patient. The patient was provided an opportunity to ask questions and all were answered. The patient agreed with the plan and demonstrated an understanding of the instructions.   The patient was advised to call back or seek an in-person evaluation if the symptoms worsen or if the condition fails to improve as anticipated.  I provided 45 minutes of non-face-to-face time during this encounter.  Summary: Pt describes his mood as "anxious" Pt says he woke up with the "jitters" Per pt report, he began taking Pristiq on 9/12. Also pt reports he received an MRI last week and no concerns reported. Pt says he had an opportunity to practice putting his thoughts on trial and appears to be ambivalent about the intervention. Pt says he continues to have intrusive thoughts and heightened anxiety. Pt says he will go for a walk, talk to his wife and mother to distract him from what is causing anxiety. Pt says this tactic works sometimes. Pt says anxiety has caused impairment in daily activities and reports he no longer makes plans with others because "I dont know how I'll  feel that day"  Suicidal/Homicidal: Pt denies SI/HI no plan, attempt or intent to harm self or others reported.  Therapist Response: CSW listened as pt discussed recent events that have caused increase in anxiety. CSW processed with pt reframing negative thinking, emphasizing best case scenario as opposed to worst case scenario. CSW reviewed and provided pt with information on grounding techniques(54321 technique) to assist with managing anxiety. Pt encouraged to apply intervention and further discuss next session.  Plan: Return again in 2 weeks.  Diagnosis: Axis I: Generalized anxiety disorder                                     Panic attack    Axis II: No diagnosis    Yvette Rack, LCSW 07/11/2021

## 2021-07-16 DIAGNOSIS — E538 Deficiency of other specified B group vitamins: Secondary | ICD-10-CM | POA: Diagnosis not present

## 2021-07-18 ENCOUNTER — Encounter (HOSPITAL_COMMUNITY): Payer: Self-pay | Admitting: Psychiatry

## 2021-07-18 ENCOUNTER — Telehealth (INDEPENDENT_AMBULATORY_CARE_PROVIDER_SITE_OTHER): Payer: Medicare HMO | Admitting: Psychiatry

## 2021-07-18 ENCOUNTER — Other Ambulatory Visit: Payer: Self-pay

## 2021-07-18 DIAGNOSIS — F410 Panic disorder [episodic paroxysmal anxiety] without agoraphobia: Secondary | ICD-10-CM

## 2021-07-18 DIAGNOSIS — F411 Generalized anxiety disorder: Secondary | ICD-10-CM

## 2021-07-18 MED ORDER — LORAZEPAM 0.5 MG PO TABS: 0.5000 mg | ORAL_TABLET | Freq: Three times a day (TID) | ORAL | 0 refills | Status: DC

## 2021-07-18 MED ORDER — DESVENLAFAXINE SUCCINATE ER 25 MG PO TB24: 25.0000 mg | ORAL_TABLET | Freq: Every day | ORAL | 0 refills | Status: DC

## 2021-07-18 MED ORDER — TRAZODONE HCL 100 MG PO TABS: 100.0000 mg | ORAL_TABLET | Freq: Every day | ORAL | 0 refills | Status: DC

## 2021-07-18 NOTE — Progress Notes (Signed)
Virtual Visit via Telephone Note  I connected with [name] Demarest Sr. on 07/18/21 at  8:40 AM EDT by telephone and verified that I am speaking with the correct person using two identifiers.  Location: Patient: Home Provider: Home Office   I discussed the limitations, risks, security and privacy concerns of performing an evaluation and management service by telephone and the availability of in person appointments. I also discussed with the patient that there may be a patient responsible charge related to this service. The patient expressed understanding and agreed to proceed.   History of Present Illness: Patient is evaluated by phone session.  We started him on Pristiq and Ativan.  He did not like Cymbalta and Klonopin.  He also had a blood work which shows low vitamin B12.  He is now getting B12 injection.  Patient did not start the Pristiq right away because he was hoping that receiving B12 injection helped his symptoms but after 2 injection he did not see much improvement and decided to take half tablet of Pristiq.  In the beginning he mentioned increased tremors, having dreams and sleepiness but after few days he started to notice improvement in his mood.  His tremors are getting better.  His anxiety and depression is also not as intense.  He has occasionally ruminative and negative thoughts but denies any paranoia, hallucination, suicidal thoughts.  He tried trazodone 150 but that did not help and now he is taking 100 mg which is helping his sleep.  He is getting at least 5 to 6 hours.  He has 1 major panic attack but overall he feels better.  He also recalled some days having dizziness and he checks his blood pressure which is low but lately his blood pressure is better.  He walks every day.  Sometimes he feels lonely because his kids back to school.  He lives with his wife who is very supportive.  He promised to give more time to Pristiq and agreed to take the full dose 25 mg soon.   Past  psychiatric history; H/O anxiety since early 21s.  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  No h/o mania, psychosis, suicidal attempt, inpatient treatment.  Seen and managed by PCP since early 4s. Tried Remeron, hydroxyzine, gabapentin, Paxil, Prozac, Zoloft, Cymbalta and Lamictal.  Stopped due to tremors or sexual side effects.   Recent Results (from the past 2160 hour(s))  Resp Panel by RT-PCR (Flu A&B, Covid) Nasopharyngeal Swab     Status: None   Collection Time: 06/03/21 11:06 AM   Specimen: Nasopharyngeal Swab; Nasopharyngeal(NP) swabs in vial transport medium  Result Value Ref Range   SARS Coronavirus 2 by RT PCR NEGATIVE NEGATIVE    Comment: (NOTE) SARS-CoV-2 target nucleic acids are NOT DETECTED.  The SARS-CoV-2 RNA is generally detectable in upper respiratory specimens during the acute phase of infection. The lowest concentration of SARS-CoV-2 viral copies this assay can detect is 138 copies/mL. A negative result does not preclude SARS-Cov-2 infection and should not be used as the sole basis for treatment or other patient management decisions. A negative result may occur with  improper specimen collection/handling, submission of specimen other than nasopharyngeal swab, presence of viral mutation(s) within the areas targeted by this assay, and inadequate number of viral copies(<138 copies/mL). A negative result must be combined with clinical observations, patient history, and epidemiological information. The expected result is Negative.  Fact Sheet for Patients:  EntrepreneurPulse.com.au  Fact Sheet for  Healthcare Providers:  IncredibleEmployment.be  This test is no t yet approved or cleared by the [place] and  has been authorized for detection and/or diagnosis of SARS-CoV-2 by FDA under an Emergency Use Authorization (EUA). This EUA will remain  in effect (meaning this test can be used) for the duration of  the COVID-19 declaration under Section 564(b)(1) of the Act, 21 U.S.C.section 360bbb-3(b)(1), unless the authorization is terminated  or revoked sooner.       Influenza A by PCR NEGATIVE NEGATIVE   Influenza B by PCR NEGATIVE NEGATIVE    Comment: (NOTE) The Xpert Xpress SARS-CoV-2/FLU/RSV plus assay is intended as an aid in the diagnosis of influenza from Nasopharyngeal swab specimens and should not be used as a sole basis for treatment. Nasal washings and aspirates are unacceptable for Xpert Xpress SARS-CoV-2/FLU/RSV testing.  Fact Sheet for Patients: EntrepreneurPulse.com.au  Fact Sheet for Healthcare Providers: IncredibleEmployment.be  This test is not yet approved or cleared by the [place] FDA and has been authorized for detection and/or diagnosis of SARS-CoV-2 by FDA under an Emergency Use Authorization (EUA). This EUA will remain in effect (meaning this test can be used) for the duration of the COVID-19 declaration under Section 564(b)(1) of the Act, 21 U.S.C. section 360bbb-3(b)(1), unless the authorization is terminated or revoked.  Performed at [facility], [place] [address] [place], [place] [address]   Comprehensive metabolic panel     Status: None   Collection Time: 06/03/21 11:06 AM  Result Value Ref Range   Sodium 139 135 - 145 mmol/L   Potassium 3.8 3.5 - 5.1 mmol/L   Chloride 106 98 - 111 mmol/L   CO2 24 22 - 32 mmol/L   Glucose, Bld 92 70 - 99 mg/dL    Comment: Glucose reference range applies only to samples taken after fasting for at least 8 hours.   BUN 8 6 - 20 mg/dL   Creatinine, Ser 0.76 0.61 - 1.24 mg/dL   Calcium 9.3 8.9 - 10.3 mg/dL   Total Protein 7.1 6.5 - 8.1 g/dL   Albumin 4.2 3.5 - 5.0 g/dL   AST 26 15 - 41 U/L   ALT 30 0 - 44 U/L   Alkaline Phosphatase 66 38 - 126 U/L   Total Bilirubin 0.7 0.3 - 1.2 mg/dL   GFR, Estimated >60 >60 mL/min    Comment: (NOTE) Calculated using the  CKD-EPI Creatinine Equation (2021)    Anion gap 9 5 - 15    Comment: Performed at [facility], [place] [address], [place], [place] [address]  Ethanol     Status: None   Collection Time: 06/03/21 11:06 AM  Result Value Ref Range   Alcohol, Ethyl (B) <10 <10 mg/dL    Comment: (NOTE) Lowest detectable limit for serum alcohol is 10 mg/dL.  For medical purposes only. Performed at [facility], [place] [address], [place], [place] [address]   CBC with Diff     Status: Abnormal   Collection Time: 06/03/21 11:06 AM  Result Value Ref Range   WBC 7.5 4.0 - 10.5 K/uL   RBC 4.56 4.22 - 5.81 MIL/uL   Hemoglobin 12.6 (L) 13.0 - 17.0 g/dL   HCT 39.1 39.0 - 52.0 %   MCV 85.7 80.0 - 100.0 fL   MCH 27.6 26.0 - 34.0 pg   MCHC 32.2 30.0 - 36.0 g/dL   RDW 14.3 11.5 - 15.5 %   Platelets 273 150 - 400 K/uL  nRBC 0.0 0.0 - 0.2 %   Neutrophils Relative % 60 %   Neutro Abs 4.5 1.7 - 7.7 K/uL   Lymphocytes Relative 32 %   Lymphs Abs 2.4 0.7 - 4.0 K/uL   Monocytes Relative 7 %   Monocytes Absolute 0.5 0.1 - 1.0 K/uL   Eosinophils Relative 1 %   Eosinophils Absolute 0.1 0.0 - 0.5 K/uL   Basophils Relative 0 %   Basophils Absolute 0.0 0.0 - 0.1 K/uL   Immature Granulocytes 0 %   Abs Immature Granulocytes 0.02 0.00 - 0.07 K/uL    Comment: Performed at [facility], [place] [address], [place], [place] [address]  Urine rapid drug screen (hosp performed)     Status: None   Collection Time: 06/03/21 11:32 AM  Result Value Ref Range   Opiates NONE DETECTED NONE DETECTED   Cocaine NONE DETECTED NONE DETECTED   Benzodiazepines NONE DETECTED NONE DETECTED   Amphetamines NONE DETECTED NONE DETECTED   Tetrahydrocannabinol NONE DETECTED NONE DETECTED   Barbiturates NONE DETECTED NONE DETECTED    Comment: (NOTE) DRUG SCREEN FOR MEDICAL PURPOSES ONLY.  IF CONFIRMATION IS NEEDED FOR ANY PURPOSE, NOTIFY LAB WITHIN 5 DAYS.  LOWEST DETECTABLE LIMITS FOR URINE  DRUG SCREEN Drug Class                     Cutoff (ng/mL) Amphetamine and metabolites    1000 Barbiturate and metabolites    200 Benzodiazepine                 604 Tricyclics and metabolites     300 Opiates and metabolites        300 Cocaine and metabolites        300 THC                            50 Performed at [facility], [place] [address], [place], [place] [address]   Vitamin B12     Status: Abnormal   Collection Time: 06/19/21 11:25 AM  Result Value Ref Range   Vitamin B-12 77 (L) 232 - 1,245 pg/mL  Thyroid Panel With TSH     Status: None   Collection Time: 06/19/21 11:25 AM  Result Value Ref Range   TSH 0.494 0.450 - 4.500 uIU/mL   T4, Total 6.1 4.5 - 12.0 ug/dL   T3 Uptake Ratio 24 24 - 39 %   Free Thyroxine Index 1.5 1.2 - 4.9  CK     Status: None   Collection Time: 06/19/21 11:25 AM  Result Value Ref Range   Total CK 95 41 - 331 U/L  C-reactive protein     Status: None   Collection Time: 06/19/21 11:25 AM  Result Value Ref Range   CRP <1 0 - 10 mg/L  Sedimentation rate     Status: None   Collection Time: 06/19/21 11:25 AM  Result Value Ref Range   Sed Rate 2 0 - 30 mm/hr     Psychiatric Specialty Exam: Physical Exam  Review of Systems  Weight 196 lb (88.9 kg).There is no height or weight on file to calculate BMI.  General Appearance: NA  Eye Contact:  NA  Speech:  Slow  Volume:  Decreased  Mood:  Anxious  Affect:  NA  Thought Process:  Goal Directed  Orientation:  Full (Time, Place, and Person)  Thought Content:  Rumination  Suicidal Thoughts:  No  Homicidal Thoughts:  No  Memory:  Immediate;   Good Recent;   Fair Remote;   Fair  Judgement:  Fair  Insight:  Shallow  Psychomotor Activity:  NA  Concentration:  Concentration: Fair and Attention Span: Fair  Recall:  [employer] of Knowledge:  Good  Language:  Good  Akathisia:   tremors getting better  Handed:  Right  AIMS (if indicated):     Assets:  Communication Skills Desire  for Improvement Housing  ADL's:  Intact  Cognition:  WNL  Sleep:   5 hrs      Assessment and Plan: Generalized anxiety disorder.  Panic attacks.  I reviewed blood work.  His B12 level is low.  Encourage to try Pristiq 25 mg since he noticed some improvement in his mood and anxiety.  He also concerned about his postural hypotension and I recommend to discuss with his PCP since he is taking hydrochlorothiazide, myicardis, losartan and amlodipine.  He like to have a trazodone 100 mg tablet since 150 does not work.  We talk about continue Ativan 0.5 mg 3 times a day but reminded if he had postural hypotension then he should consider skipping the dose of Ativan.  He will discuss these issues with his PCP.  For now continue Ativan 0.5 mg 3 times a day.  Encourage therapy with Mr. Aubery Lapping.  Recommended to call us back if there is any question or any concern.  Patient like to have a follow-up in 4 weeks.  Follow Up Instructions: I discussed the assessment and treatment plan with the patient. The patient was provided an opportunity to ask questions and all were answered. The patient agreed with the plan and demonstrated an understanding of the instructions   The patient was advised to call back or seek an in-person evaluation if the symptoms worsen or if the condition fails to improve as anticipated.  I provided 26 minutes of non-face-to-face time during this encounter.   [name], MD

## 2021-07-23 DIAGNOSIS — Z79899 Other long term (current) drug therapy: Secondary | ICD-10-CM | POA: Diagnosis not present

## 2021-07-23 DIAGNOSIS — E538 Deficiency of other specified B group vitamins: Secondary | ICD-10-CM | POA: Diagnosis not present

## 2021-07-29 ENCOUNTER — Telehealth (HOSPITAL_COMMUNITY): Payer: Self-pay | Admitting: *Deleted

## 2021-07-29 DIAGNOSIS — F410 Panic disorder [episodic paroxysmal anxiety] without agoraphobia: Secondary | ICD-10-CM

## 2021-07-29 MED ORDER — LORAZEPAM 0.5 MG PO TABS: 0.5000 mg | ORAL_TABLET | Freq: Four times a day (QID) | ORAL | 0 refills | Status: DC | PRN

## 2021-07-29 NOTE — Telephone Encounter (Signed)
Done

## 2021-07-29 NOTE — Telephone Encounter (Signed)
Pt called c/o Lorazepam 0.5 mg tid qd is not helping with his anxiety. Collateral from pt therapist, Evangeline Gula, supports this complaint. Pt has an upcoming appointment with you on 08/08/21. Please review and advise.

## 2021-07-31 ENCOUNTER — Ambulatory Visit (INDEPENDENT_AMBULATORY_CARE_PROVIDER_SITE_OTHER): Payer: Medicare HMO | Admitting: Clinical

## 2021-07-31 ENCOUNTER — Other Ambulatory Visit: Payer: Self-pay

## 2021-07-31 DIAGNOSIS — F410 Panic disorder [episodic paroxysmal anxiety] without agoraphobia: Secondary | ICD-10-CM

## 2021-07-31 DIAGNOSIS — F411 Generalized anxiety disorder: Secondary | ICD-10-CM

## 2021-07-31 NOTE — Progress Notes (Signed)
   THERAPIST PROGRESS NOTE  Session Time: 11am  Participation Level: Active  Behavioral Response: NAAlertDysphoric  Type of Therapy: Individual Therapy  Treatment Goals addressed: Coping  Interventions: CBT Virtual Visit via Telephone Note  I connected with [name] [name] on 07/31/21 at 11:00 AM EDT by telephone and verified that I am speaking with the correct person using two identifiers.  Location: Patient: home Provider: office   I discussed the limitations, risks, security and privacy concerns of performing an evaluation and management service by telephone and the availability of in person appointments. I also discussed with the patient that there may be a patient responsible charge related to this service. The patient expressed understanding and agreed to proceed.   I discussed the assessment and treatment plan with the patient. The patient was provided an opportunity to ask questions and all were answered. The patient agreed with the plan and demonstrated an understanding of the instructions.   The patient was advised to call back or seek an in-person evaluation if the symptoms worsen or if the condition fails to improve as anticipated.  I provided 45 minutes of non-face-to-face time during this encounter.   Summary: Pt reports he has been experiencing tearfulness and sadness. Pt states he has not had many good days but the one good day he had consisted of him thrifting, watching tv or talking to family and friends. Pt reports he has a tendency of focusing on past events which leads to increased anxiety. Pt states "when the nerves kick in I get sad" Pt discussed feeling "jittery and having tremors" that he states is a side effect of his medication. Pt says he has been reluctant to take a full dose of his medication due to fearing an increase in tremors. Pt discussed "feeling weak as a man" and describes himself as a "crybaby" Pt says he is accustomed to being strong for others and  says he feels less than relying on others for assistance and having crying spells. Pt endorses a history of anxiety and says he has been taking lorazepam since age 39's.  Suicidal/Homicidal: Pt denies SI/HI no plan, attempt or intent to harm self or others reported.  Therapist Response: CSW continues to process with pt putting thoughts on trial to challenge irrational beliefs. CSW probed for feedback on healthy coping methods he has used in the past that he found beneficial. CSW processed with pt sxs of anxiety and normalizing it as a healthy emotion. CSW discussed with pt practicing self compassion and understanding he is a work in progress.  Plan: Return again in 2 weeks.  Diagnosis: Axis I: Generalized anxiety disorder                                     Panic attack    Axis II: No diagnosis    Yvette Rack, LCSW 07/31/2021

## 2021-08-08 ENCOUNTER — Other Ambulatory Visit: Payer: Self-pay

## 2021-08-08 ENCOUNTER — Encounter (HOSPITAL_COMMUNITY): Payer: Self-pay | Admitting: Psychiatry

## 2021-08-08 ENCOUNTER — Telehealth (HOSPITAL_BASED_OUTPATIENT_CLINIC_OR_DEPARTMENT_OTHER): Payer: Medicare HMO | Admitting: Psychiatry

## 2021-08-08 DIAGNOSIS — F411 Generalized anxiety disorder: Secondary | ICD-10-CM

## 2021-08-08 DIAGNOSIS — F410 Panic disorder [episodic paroxysmal anxiety] without agoraphobia: Secondary | ICD-10-CM | POA: Diagnosis not present

## 2021-08-08 MED ORDER — TRAZODONE HCL 100 MG PO TABS: 100.0000 mg | ORAL_TABLET | Freq: Every day | ORAL | 0 refills | Status: DC

## 2021-08-08 MED ORDER — QUETIAPINE FUMARATE 25 MG PO TABS: 25.0000 mg | ORAL_TABLET | Freq: Three times a day (TID) | ORAL | 0 refills | Status: DC | PRN

## 2021-08-08 MED ORDER — LORAZEPAM 1 MG PO TABS: 1.0000 mg | ORAL_TABLET | Freq: Two times a day (BID) | ORAL | 0 refills | Status: DC | PRN

## 2021-08-08 NOTE — Progress Notes (Signed)
Virtual Visit via Telephone Note  I connected with [name] Burgert Sr. on 08/08/21 at 10:00 AM EDT by telephone and verified that I am speaking with the correct person using two identifiers.  Location: Patient: Home Provider: Home Office   I discussed the limitations, risks, security and privacy concerns of performing an evaluation and management service by telephone and the availability of in person appointments. I also discussed with the patient that there may be a patient responsible charge related to this service. The patient expressed understanding and agreed to proceed.   History of Present Illness: Patient is evaluated by phone session.  We started him on Pristiq 25 mg and he is taking slowly and gradually but after 2 weeks of improvement he started to have tremors and seemed anxiety.  He was not even taking full dose and taking three fourths of the Pristiq 25 mg.  He is taking Ativan 1 mg twice a day.  He is taking trazodone at bedtime.  His biggest concern is anxiety with tremors and some time his tremors are unrelated with anxiety as there are times when he was not taking antidepressant he was still having tremors.  He had seen the neurologist Dr. Krista [name] 3 months ago who told him that his tremors are medication related.  Patient reported these tremors are sometimes so significant that make him sad, depressed, lack of energy, motivation and does not leave the house.  The past few days he noticed not eating and sleeping well.  Denies any suicidal thoughts but admitted a lot of ruminative and negative thoughts.  Lives with his wife who is supportive.  His weight is stable.  He is concerned that if he go outside he will have a panic attack.  Denies any hallucination or any paranoia.     Past psychiatric history; H/O anxiety since early 36s.  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  No h/o mania, psychosis, suicidal attempt, inpatient treatment.  Seen and managed by PCP since  early 3s. Tried Remeron, hydroxyzine, gabapentin, Paxil, Prozac, Zoloft, Cymbalta and Lamictal.  Trintellix helped but in the emergency room it was discontinued as patient complaining of tremors.  Pristiq could not tolerate more than 25 mg.   [facility] Minus psych testing results shows Cymbalta, mirtazapine, Paxil, Luvox, clozapine, olanzapine, thiothixene has significant drug interaction.  Psychiatric Specialty Exam: Physical Exam  Review of Systems  Weight 196 lb (88.9 kg).There is no height or weight on file to calculate BMI.  General Appearance: NA  Eye Contact:  NA  Speech:  Slow  Volume:  Decreased  Mood:  Anxious  Affect:  NA  Thought Process:  Descriptions of Associations: Intact  Orientation:  Full (Time, Place, and Person)  Thought Content:  Rumination  Suicidal Thoughts:  No  Homicidal Thoughts:  No  Memory:  Immediate;   Good Recent;   Fair Remote;   Fair  Judgement:  Fair  Insight:  Shallow  Psychomotor Activity:  NA  Concentration:  Concentration: Fair and Attention Span: Fair  Recall:  Good  Fund of Knowledge:  Good  Language:  Good  Akathisia:  No  Handed:  Right  AIMS (if indicated):     Assets:  Communication Skills Desire for Improvement Housing  ADL's:  Intact  Cognition:  WNL  Sleep:   fair     Assessment and Plan: Generalized anxiety disorder.  Panic attacks.  Tremors.  I reviewed his medication, Gene-site testing results and review with  him.  Patient is not taking full dose of Pristiq 25 mg and decided to have tremors.  He is also very afraid stopping cold [place] Pristiq and afraid to go up on the dose because of the tremors.  We have discussed multiple options and one of the option is to try quetiapine 25 mg which has less incidence to cause tremors.  I encouraged him to slowly and gradually cut down his Pristiq to avoid withdrawals.  For now continue Ativan 1 mg twice a day and trazodone 100 mg at bedtime.  We will follow up in 2 weeks.  If condition do not  improve with the quetiapine then I do believe he needs a second opinion to see neurologist for the tremors.  We also talked about going back on Trintellix which he believes worked well but it was discontinued in the emergency room after he is complaining of tremors.  Encouraged to continue therapy with Sanjuana Kava.  Follow up in 2 weeks.  Follow Up Instructions:    I discussed the assessment and treatment plan with the patient. The patient was provided an opportunity to ask questions and all were answered. The patient agreed with the plan and demonstrated an understanding of the instructions.   The patient was advised to call back or seek an in-person evaluation if the symptoms worsen or if the condition fails to improve as anticipated.  I provided 19 minutes of non-face-to-face time during this encounter.   [name], MD

## 2021-08-15 ENCOUNTER — Telehealth (HOSPITAL_COMMUNITY): Payer: Self-pay | Admitting: *Deleted

## 2021-08-15 DIAGNOSIS — E538 Deficiency of other specified B group vitamins: Secondary | ICD-10-CM | POA: Diagnosis not present

## 2021-08-15 DIAGNOSIS — Z23 Encounter for immunization: Secondary | ICD-10-CM | POA: Diagnosis not present

## 2021-08-15 NOTE — Telephone Encounter (Signed)
Writer spoke with pt who asked to go back on Trintellix (5 mg initially) as he feels the Seroquel is causing pt to be SOB. Pt is also still concerned about coming off the Pristiq. Pt states that he is taking 1/2 of the 25 mg (12.5 mg) Pristiq currently. Pt has an upcoming appointment on 08/22/21. Please review and advise. Thanks.

## 2021-08-15 NOTE — Telephone Encounter (Signed)
Pt has been seen at [facility] so I haven't made referral sooner. I will encourage him to reschedule cx appointment for 08/19/21. Thanks.

## 2021-08-15 NOTE — Telephone Encounter (Signed)
He need to see the neurologist for the tremors.  We can discontinue Seroquel and if he wants to go back to Trintellix 5 mg then he must stop Pristiq.  If you agree then please call Trintellix to his local pharmacy.

## 2021-08-16 ENCOUNTER — Telehealth (HOSPITAL_COMMUNITY): Payer: Self-pay | Admitting: *Deleted

## 2021-08-16 ENCOUNTER — Other Ambulatory Visit (HOSPITAL_COMMUNITY): Payer: Self-pay | Admitting: *Deleted

## 2021-08-16 MED ORDER — VORTIOXETINE HBR 5 MG PO TABS: 5.0000 mg | ORAL_TABLET | Freq: Every day | ORAL | 0 refills | Status: DC

## 2021-08-16 NOTE — Telephone Encounter (Signed)
This nurse spoke with pt regarding stopping the Seroquel and Pristiq and starting Trintellix 5 mg qd. Pt expressed anxiety in relation to discontinuing Pristiq. Pt agreed to d/c Seroquel and Pristiq on Sunday night and pick up Trintellix samples on Monday (08/19/21). Pt has an upcoming appointment on 08/19/21.

## 2021-08-16 NOTE — Telephone Encounter (Signed)
Will do!

## 2021-08-16 NOTE — Telephone Encounter (Signed)
He needs second opinion.  Can you do for him to [facility] neurology.

## 2021-08-19 ENCOUNTER — Ambulatory Visit: Payer: Self-pay | Admitting: Neurology

## 2021-08-22 ENCOUNTER — Encounter (HOSPITAL_COMMUNITY): Payer: Self-pay | Admitting: Psychiatry

## 2021-08-22 ENCOUNTER — Telehealth (HOSPITAL_BASED_OUTPATIENT_CLINIC_OR_DEPARTMENT_OTHER): Payer: Medicare HMO | Admitting: Psychiatry

## 2021-08-22 ENCOUNTER — Other Ambulatory Visit: Payer: Self-pay

## 2021-08-22 VITALS — Wt 196.0 lb

## 2021-08-22 DIAGNOSIS — R251 Tremor, unspecified: Secondary | ICD-10-CM | POA: Diagnosis not present

## 2021-08-22 DIAGNOSIS — F410 Panic disorder [episodic paroxysmal anxiety] without agoraphobia: Secondary | ICD-10-CM | POA: Diagnosis not present

## 2021-08-22 DIAGNOSIS — F411 Generalized anxiety disorder: Secondary | ICD-10-CM | POA: Diagnosis not present

## 2021-08-22 MED ORDER — TRAZODONE HCL 100 MG PO TABS: 100.0000 mg | ORAL_TABLET | Freq: Every day | ORAL | 0 refills | Status: DC

## 2021-08-22 MED ORDER — VORTIOXETINE HBR 10 MG PO TABS: 10.0000 mg | ORAL_TABLET | Freq: Every day | ORAL | 0 refills | Status: DC

## 2021-08-22 MED ORDER — LORAZEPAM 1 MG PO TABS: 1.0000 mg | ORAL_TABLET | Freq: Two times a day (BID) | ORAL | 0 refills | Status: DC | PRN

## 2021-08-22 NOTE — Progress Notes (Signed)
Virtual Visit via Telephone Note  I connected with [name] Rodelo Sr. on 08/22/21 at  9:00 AM EDT by telephone and verified that I am speaking with the correct person using two identifiers.  Location: Patient: Home Provider: Home Office   I discussed the limitations, risks, security and privacy concerns of performing an evaluation and management service by telephone and the availability of in person appointments. I also discussed with the patient that there may be a patient responsible charge related to this service. The patient expressed understanding and agreed to proceed.   History of Present Illness: Patient is evaluated by phone session.  He is now taking Trintellix 5 mg and stopped Pristiq 12.5 mg.  Patient admitted that since he stopped the Pristiq his tremors got better and now he started Trintellix he feels his tremors are coming back.  However he like to give more time to Trintellix because he still feels sad, depressed, anxious.  He is taking trazodone at bedtime and Ativan 1 mg twice a day.  He is in therapy with Sanjuana Kava but he has no upcoming appointment and is hoping to schedule appointment tomorrow for therapy.  He endorsed chronic symptoms of tiredness, lack of motivation, lack of energy and feels anxious and does not leave the house.  However he denies any paranoia, hallucination, suicidal thoughts.  His appetite and weight is okay.  His with his wife is supportive.  Denies any major panic attack.  Past psychiatric history; H/O anxiety since early 54s.  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  No h/o mania, psychosis, suicidal attempt, inpatient treatment.  Seen and managed by PCP since early 88s. Tried Remeron, hydroxyzine, gabapentin, Paxil, Prozac, Zoloft, Cymbalta and Lamictal.  Trintellix helped but in the emergency room it was discontinued as patient complaining of tremors.  Pristiq could not tolerate more than 25 mg.   Gene-Sight shows  Cymbalta, mirtazapine, Paxil, Luvox, clozapine, olanzapine, thiothixene has significant drug interaction.  Psychiatric Specialty Exam: Physical Exam  Review of Systems  Weight 196 lb (88.9 kg).There is no height or weight on file to calculate BMI.  General Appearance: NA  Eye Contact:  NA  Speech:  Slow  Volume:  Decreased  Mood:  Anxious and Dysphoric  Affect:  NA  Thought Process:  Descriptions of Associations: Intact  Orientation:  Full (Time, Place, and Person)  Thought Content:  Rumination  Suicidal Thoughts:  No  Homicidal Thoughts:  No  Memory:  Immediate;   Good Recent;   Good Remote;   Fair  Judgement:  Fair  Insight:  Shallow  Psychomotor Activity:  Tremor  Concentration:  Concentration: Fair and Attention Span: Fair  Recall:  Good  Fund of Knowledge:  Good  Language:  Good  Akathisia:  No  Handed:  Right  AIMS (if indicated):     Assets:  Communication Skills Desire for Improvement Housing Social Support  ADL's:  Intact  Cognition:  WNL  Sleep:   Fair      Assessment and Plan: Generalized anxiety disorder.  Panic attacks.  Tremors.  We talked about chronic tremors that usually fluctuates with the psychotropic medication but recalled there were times when he was not taking any psych medicine and still have tremors.  Reinforced to see neurologist.  Patient has seen neurology at [facility] neurology but no work-up was done.  I recommend he should get a second opinion at [facility] neurology.  I also discussed that he may consider  talking to his PCP to switching his Anti-hypertensive to propranolol which can help his anxiety, tremors.  He promised to contact his PCP.  For now he agreed to give more time to Trintellix and increase dose to 10 mg.  I also recommend he should take the Trintellix in the morning and keep the trazodone at bedtime.  He will continue Ativan 1 mg twice a day.  Encouraged to keep appointment with Sanjuana Kava.  He is no longer taking Pristiq.   Follow-up in 3 to 4 weeks.  Follow Up Instructions:    I discussed the assessment and treatment plan with the patient. The patient was provided an opportunity to ask questions and all were answered. The patient agreed with the plan and demonstrated an understanding of the instructions.   The patient was advised to call back or seek an in-person evaluation if the symptoms worsen or if the condition fails to improve as anticipated.  I provided 20 minutes of non-face-to-face time during this encounter.   [name], MD

## 2021-08-23 ENCOUNTER — Telehealth (HOSPITAL_COMMUNITY): Payer: Self-pay | Admitting: *Deleted

## 2021-08-23 NOTE — Telephone Encounter (Signed)
He need to call his PCP too for propranolol. We discussed on last appointment.

## 2021-08-23 NOTE — Telephone Encounter (Signed)
Pt called this morning with c/o increased anxiety, tremors, insomnia. Pt states that he had to take 200 mg of Trazodone last night and still did not sleep. Pt also c/o "racing thoughts". FYI referral faxed to [facility]. Please review and advise. Thanks.

## 2021-08-27 ENCOUNTER — Telehealth (HOSPITAL_COMMUNITY): Payer: Self-pay | Admitting: *Deleted

## 2021-08-27 NOTE — Telephone Encounter (Signed)
He had tremors with Pristiq. I will recommend to stop Trintellix or reduced to 5 mg only.  Wait for neurology opinion and restart therapy with Darren.

## 2021-08-27 NOTE — Telephone Encounter (Signed)
Writer received a VM from pt asking ot speak with you regarding "Trintellix issues" and not feeling good. I returned call however had to leave a VM for pt. Would you be able to speak to him? Phone # [phone]. Pt next appointment is on 09/17/21. Trintellix increased to 10 mg on last visit on 08/22/21. Please review and advise.Thank you.

## 2021-08-27 NOTE — Telephone Encounter (Signed)
Writer spoke with pt who states that he woke up with "a panic attack" last night and felt like his brain "was trembling". Pt states increasing anxiety since resuming Trintellix, especially since increase back up to 10mg . Pt does not really want to continue Trintellix, is afraid to take tonight. [name] [name] says that he would be willing to go back on the Pristiq if nothing else. Please review. Pt has been contacted by [facility] fyi.

## 2021-08-27 NOTE — Telephone Encounter (Signed)
Ok. Will do. He acknowledged he didn't like the Pristiq in the beginning but feels anxiety was less. With the Trintellix he says he has to use more Ativan just to level off. FYI.

## 2021-09-02 DIAGNOSIS — F411 Generalized anxiety disorder: Secondary | ICD-10-CM | POA: Diagnosis not present

## 2021-09-02 DIAGNOSIS — H6122 Impacted cerumen, left ear: Secondary | ICD-10-CM | POA: Diagnosis not present

## 2021-09-02 DIAGNOSIS — G250 Essential tremor: Secondary | ICD-10-CM | POA: Diagnosis not present

## 2021-09-12 ENCOUNTER — Ambulatory Visit (INDEPENDENT_AMBULATORY_CARE_PROVIDER_SITE_OTHER): Payer: Medicare HMO | Admitting: Clinical

## 2021-09-12 ENCOUNTER — Other Ambulatory Visit: Payer: Self-pay

## 2021-09-12 DIAGNOSIS — F411 Generalized anxiety disorder: Secondary | ICD-10-CM

## 2021-09-12 DIAGNOSIS — F410 Panic disorder [episodic paroxysmal anxiety] without agoraphobia: Secondary | ICD-10-CM

## 2021-09-12 NOTE — Progress Notes (Signed)
   THERAPIST PROGRESS NOTE  Session Time: 11am  Participation Level: Active  Behavioral Response:  unknown appearanceAlertAnxious  Type of Therapy: Individual Therapy  Treatment Goals addressed: Anxiety and Coping  Interventions: CBT Virtual Visit via Telephone Note  I connected with [name] [name] on 09/12/21 at 11:00 AM EST by telephone and verified that I am speaking with the correct person using two identifiers.  Location: Patient: home Provider: office   I discussed the limitations, risks, security and privacy concerns of performing an evaluation and management service by telephone and the availability of in person appointments. I also discussed with the patient that there may be a patient responsible charge related to this service. The patient expressed understanding and agreed to proceed.   I discussed the assessment and treatment plan with the patient. The patient was provided an opportunity to ask questions and all were answered. The patient agreed with the plan and demonstrated an understanding of the instructions.   The patient was advised to call back or seek an in-person evaluation if the symptoms worsen or if the condition fails to improve as anticipated.  I provided 50 minutes of non-face-to-face time during this encounter.  Summary: Pt describes his mood as "nervous, jittery" Pt says he has been prescribed Trintillex but reports noticing an increase in tremors since taking the medication. Pt reports he is scheduled to meet with psychiatrist on next Tuesday where he plans to discuss his concerns. Pt reports having a panic attack this week that he believes was triggered by an expensive car repair. When asked what he did to calm himself, he said "I thought about something else" Pt states  going for walks and listening to music can be soothing.  Suicidal/Homicidal: Pt denies SI/HI no plan, attempt or intent to harm self or others reported. However, pt states if his anxiety  continues to worsens he is concerned he may have to go inpatient.   Therapist Response: CSW assessed for changes in mood, behavior and daily functioning. CSW brainstormed with pt healthy distractions he can use to challenge unwanted emotions. CSW processed with pt catastrophisizing and utilizing cognitive restructuring/ decatastrophisizing to correct irrational thinking.   Plan: Return again in 2 weeks.  Diagnosis: Axis I: Generalized anxiety disorder                                     Panic attack    Axis II: No diagnosis    Yvette Rack, LCSW 09/12/2021

## 2021-09-16 DIAGNOSIS — E538 Deficiency of other specified B group vitamins: Secondary | ICD-10-CM | POA: Diagnosis not present

## 2021-09-17 ENCOUNTER — Telehealth (HOSPITAL_COMMUNITY): Payer: Self-pay | Admitting: *Deleted

## 2021-09-17 ENCOUNTER — Telehealth (HOSPITAL_BASED_OUTPATIENT_CLINIC_OR_DEPARTMENT_OTHER): Payer: Medicare HMO | Admitting: Psychiatry

## 2021-09-17 ENCOUNTER — Other Ambulatory Visit: Payer: Self-pay

## 2021-09-17 DIAGNOSIS — R251 Tremor, unspecified: Secondary | ICD-10-CM

## 2021-09-17 DIAGNOSIS — F411 Generalized anxiety disorder: Secondary | ICD-10-CM

## 2021-09-17 DIAGNOSIS — G894 Chronic pain syndrome: Secondary | ICD-10-CM | POA: Diagnosis not present

## 2021-09-17 DIAGNOSIS — F410 Panic disorder [episodic paroxysmal anxiety] without agoraphobia: Secondary | ICD-10-CM | POA: Diagnosis not present

## 2021-09-17 DIAGNOSIS — Z6828 Body mass index (BMI) 28.0-28.9, adult: Secondary | ICD-10-CM | POA: Diagnosis not present

## 2021-09-17 DIAGNOSIS — I10 Essential (primary) hypertension: Secondary | ICD-10-CM | POA: Diagnosis not present

## 2021-09-17 MED ORDER — LORAZEPAM 1 MG PO TABS: 1.0000 mg | ORAL_TABLET | Freq: Two times a day (BID) | ORAL | 1 refills | Status: DC | PRN

## 2021-09-17 MED ORDER — TRAZODONE HCL 100 MG PO TABS: 100.0000 mg | ORAL_TABLET | Freq: Every day | ORAL | 1 refills | Status: DC

## 2021-09-17 MED ORDER — VORTIOXETINE HBR 5 MG PO TABS: 5.0000 mg | ORAL_TABLET | Freq: Every day | ORAL | 0 refills | Status: DC

## 2021-09-17 NOTE — Telephone Encounter (Signed)
Referral faxed to [facility] per Dr. Adele Schilder for Cortisol level and f/u for h/o adrenalectomy. Referrals have also been sent to [facility] as well as [facility] for ongoing tremors. Pt states that [facility] has contacted him but says that he really didn't know what they wanted and has not spoken with them further he says. Pt to pick up Trintellix 5 mg samples on 09/23/21.

## 2021-09-17 NOTE — Progress Notes (Signed)
Virtual Visit via Telephone Note  I connected with [name] Shaff Sr. on 09/17/21 at  9:20 AM EST by telephone and verified that I am speaking with the correct person using two identifiers.  Location: Patient: Home Provider: Home Office   I discussed the limitations, risks, security and privacy concerns of performing an evaluation and management service by telephone and the availability of in person appointments. I also discussed with the patient that there may be a patient responsible charge related to this service. The patient expressed understanding and agreed to proceed.   History of Present Illness: Patient is evaluated by phone session.  He tried taking a higher dose of Trintellix but started to have tremors and went back to 5 mg only.  We have tried Pristiq but that because worsening of tremors.  We have recommended discussed with the PCP to try propanolol but he had a prescription however not started yet.  Patient has a lot of questions about his physical health.  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  Patient told that he has not seen a specialist to check his cortisol level and he is reading online that may be contributing to his anxiety.  He is taking Norvasc, hydrochlorothiazide, Micardis and now propranolol is given by PCP.  He is very concerned taking multiple antihypertensive medication.  He is also have not heard from neurology to address his tremors.  So far he feels 5 mg of Trintellix keeping him stable enough until mid to find out more about his physical condition.  His birthday coming and he does not want to change any medication during the holidays.  So far he is tolerating medication and reported no major concern.  He started therapy with Sanjuana Kava.  He denies any major panic attack.  He lives with his wife who is supportive.  His appetite is okay and he did not notice any  change in his weight.  He reported Ativan helping his panic attack.  Past psychiatric history; H/O anxiety since early 16s.  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  No h/o mania, psychosis, suicidal attempt, inpatient treatment.  Seen and managed by PCP since early 58s. Tried Remeron, hydroxyzine, gabapentin, Paxil, Prozac, Zoloft, Cymbalta and Lamictal.  Trintellix helped but in the emergency room it was discontinued as patient complaining of tremors.  Pristiq could not tolerate more than 25 mg.   Gene-Sight shows Cymbalta, mirtazapine, Paxil, Luvox, clozapine, olanzapine, thiothixene has significant drug interaction.   Psychiatric Specialty Exam: Physical Exam  Review of Systems  There were no vitals taken for this visit.There is no height or weight on file to calculate BMI.  General Appearance: NA  Eye Contact:  NA  Speech:  Slow  Volume:  Decreased  Mood:  Anxious  Affect:  NA  Thought Process:  Goal Directed  Orientation:  Full (Time, Place, and Person)  Thought Content:  Rumination  Suicidal Thoughts:  No  Homicidal Thoughts:  No  Memory:  Immediate;   Good Recent;   Good Remote;   Good  Judgement:  Fair  Insight:  Shallow  Psychomotor Activity:  NA  Concentration:  Concentration: Fair and Attention Span: Fair  Recall:  [employer] of Knowledge:  Good  Language:  Good  Akathisia:  No  Handed:  Right  AIMS (if indicated):     Assets:  Communication Skills Desire for Improvement Housing Resilience Social Support  ADL's:  Intact  Cognition:  WNL  Sleep:   ok      Assessment and Plan: Generalized anxiety disorder.  Panic attacks.  Tremors.  Patient is taking only Trintellix 5 mg as higher dose caused worsening of tremors.  I explained that he should contact his PCP about his antihypertensive medication.  He is not comfortable as PCP started on propranolol and he has to take other 3 medication for hypertension.  He also not comfortable that his  PCP has never referred to endocrinologist to check on his cortisol level.  I explained fluctuation of cortisol level may cause worsening of anxiety since patient has history of adrenal gland removed.  He recalled his anxiety got worse after the surgery when he was diagnosed with pheochromocytoma.  He decided to keep the Trintellix 5 mg for now along with trazodone 100 mg at bedtime and Ativan 1 mg twice a day.  We talk about side effects as patient has concern about serotonin syndrome but he does not want to change the medication until the holidays are over.  I agree with the plan.  We will follow up in 2 months.  Encouraged to continue therapy with Sanjuana Kava.  We will provide Trintellix 5 mg samples and will contact her PCP as patient may need a referral to see endocrinologist to check on her cortisol levels and he also need clarification about antihypertensive medication.    Follow Up Instructions:    I discussed the assessment and treatment plan with the patient. The patient was provided an opportunity to ask questions and all were answered. The patient agreed with the plan and demonstrated an understanding of the instructions.   The patient was advised to call back or seek an in-person evaluation if the symptoms worsen or if the condition fails to improve as anticipated.  I provided 39 minutes of non-face-to-face time during this encounter.   [name], MD

## 2021-09-26 ENCOUNTER — Ambulatory Visit (HOSPITAL_COMMUNITY): Payer: Medicare HMO | Admitting: Clinical

## 2021-10-01 ENCOUNTER — Telehealth (HOSPITAL_COMMUNITY): Payer: Self-pay | Admitting: *Deleted

## 2021-10-01 NOTE — Telephone Encounter (Signed)
Writer spoke with pt regarding renewing pt assistance, [employer], for Trintellix 5 mg. Pt is aware that he will have to provide income information on application. Pt verbalizes understanding. Kaushik will fill out pt part of application in office on 10/03/21.

## 2021-10-09 ENCOUNTER — Ambulatory Visit (HOSPITAL_COMMUNITY): Payer: Medicare HMO | Admitting: Clinical

## 2021-10-11 DIAGNOSIS — J439 Emphysema, unspecified: Secondary | ICD-10-CM | POA: Diagnosis not present

## 2021-10-11 DIAGNOSIS — I10 Essential (primary) hypertension: Secondary | ICD-10-CM | POA: Diagnosis not present

## 2021-10-11 DIAGNOSIS — F321 Major depressive disorder, single episode, moderate: Secondary | ICD-10-CM | POA: Diagnosis not present

## 2021-10-11 DIAGNOSIS — E785 Hyperlipidemia, unspecified: Secondary | ICD-10-CM | POA: Diagnosis not present

## 2021-10-11 DIAGNOSIS — E7800 Pure hypercholesterolemia, unspecified: Secondary | ICD-10-CM | POA: Diagnosis not present

## 2021-10-16 DIAGNOSIS — E538 Deficiency of other specified B group vitamins: Secondary | ICD-10-CM | POA: Diagnosis not present

## 2021-10-17 ENCOUNTER — Other Ambulatory Visit: Payer: Self-pay

## 2021-10-17 ENCOUNTER — Ambulatory Visit (INDEPENDENT_AMBULATORY_CARE_PROVIDER_SITE_OTHER): Payer: Medicare HMO | Admitting: Clinical

## 2021-10-17 DIAGNOSIS — F410 Panic disorder [episodic paroxysmal anxiety] without agoraphobia: Secondary | ICD-10-CM

## 2021-10-17 DIAGNOSIS — F411 Generalized anxiety disorder: Secondary | ICD-10-CM

## 2021-10-17 NOTE — Progress Notes (Signed)
° °  THERAPIST PROGRESS NOTE  Session Time: 10am  Participation Level: Active  Behavioral Response: Alertpleasant  Type of Therapy: Individual Therapy  Treatment Goals addressed: Anxiety and Coping  Interventions: CBT Virtual Visit via Telephone Note  I connected with [name] [name] on 10/17/21 at 10:00 AM EST by telephone and verified that I am speaking with the correct person using two identifiers.  Location: Patient: home Provider: office   I discussed the limitations, risks, security and privacy concerns of performing an evaluation and management service by telephone and the availability of in person appointments. I also discussed with the patient that there may be a patient responsible charge related to this service. The patient expressed understanding and agreed to proceed.   I discussed the assessment and treatment plan with the patient. The patient was provided an opportunity to ask questions and all were answered. The patient agreed with the plan and demonstrated an understanding of the instructions.   The patient was advised to call back or seek an in-person evaluation if the symptoms worsen or if the condition fails to improve as anticipated.  I provided 40 minutes of non-face-to-face time during this encounter.  Summary: Pt presents in a pleasant manner. Pt reports he has been experiencing panic attacks and sadness. Per pt report, he has friends with terminal illnesses and worries about losing them. Pt says he also thinks about how he will respond if he loses a family member. Pt states decrease in tremors and consistent sleep pattern. According to pt, he is practicing deep breathing exercises when feeling overwhelmed and finds this beneficial.  Suicidal/Homicidal: Pt denies SI/HI no plan intent or attempt to harm self or others reported.  Therapist Response: Assessed for changes in mood, behavior and daily functioning. Modeled for pt how to use deep breathing. Assisted pt with  identifying things that are in and out of his control. Provided examples of grounding techniques to help manage anxiety. Reviewed with pt putting thoughts on trial to challenge unwanted emotion.  Plan: Return again in 3 weeks.  Diagnosis: Axis I: Generalized anxiety disorder    Panic attack    Axis II: No diagnosis    Yvette Rack, LCSW 10/17/2021

## 2021-10-25 DIAGNOSIS — J439 Emphysema, unspecified: Secondary | ICD-10-CM | POA: Diagnosis not present

## 2021-10-25 DIAGNOSIS — E538 Deficiency of other specified B group vitamins: Secondary | ICD-10-CM | POA: Diagnosis not present

## 2021-10-25 DIAGNOSIS — E7800 Pure hypercholesterolemia, unspecified: Secondary | ICD-10-CM | POA: Diagnosis not present

## 2021-10-25 DIAGNOSIS — Z0001 Encounter for general adult medical examination with abnormal findings: Secondary | ICD-10-CM | POA: Diagnosis not present

## 2021-10-25 DIAGNOSIS — Z79899 Other long term (current) drug therapy: Secondary | ICD-10-CM | POA: Diagnosis not present

## 2021-10-25 DIAGNOSIS — G250 Essential tremor: Secondary | ICD-10-CM | POA: Diagnosis not present

## 2021-10-25 DIAGNOSIS — E041 Nontoxic single thyroid nodule: Secondary | ICD-10-CM | POA: Diagnosis not present

## 2021-10-25 DIAGNOSIS — Z125 Encounter for screening for malignant neoplasm of prostate: Secondary | ICD-10-CM | POA: Diagnosis not present

## 2021-10-25 DIAGNOSIS — F411 Generalized anxiety disorder: Secondary | ICD-10-CM | POA: Diagnosis not present

## 2021-10-25 DIAGNOSIS — F321 Major depressive disorder, single episode, moderate: Secondary | ICD-10-CM | POA: Diagnosis not present

## 2021-11-05 ENCOUNTER — Ambulatory Visit (INDEPENDENT_AMBULATORY_CARE_PROVIDER_SITE_OTHER): Payer: Medicare HMO | Admitting: Clinical

## 2021-11-05 ENCOUNTER — Other Ambulatory Visit: Payer: Self-pay

## 2021-11-05 DIAGNOSIS — F411 Generalized anxiety disorder: Secondary | ICD-10-CM | POA: Diagnosis not present

## 2021-11-05 DIAGNOSIS — F410 Panic disorder [episodic paroxysmal anxiety] without agoraphobia: Secondary | ICD-10-CM

## 2021-11-05 NOTE — Progress Notes (Signed)
° °  THERAPIST PROGRESS NOTE  Session Time: 2pm  Participation Level: Active  Behavioral Response: AlertAnxious  Type of Therapy: Individual Therapy  Treatment Goals addressed: Anxiety  Interventions: CBT Virtual Visit via Telephone Note  I connected with [name] [name] on 11/05/21 at  2:00 PM EST by telephone and verified that I am speaking with the correct person using two identifiers.  Location: Patient: home Provider: office   I discussed the limitations, risks, security and privacy concerns of performing an evaluation and management service by telephone and the availability of in person appointments. I also discussed with the patient that there may be a patient responsible charge related to this service. The patient expressed understanding and agreed to proceed.   I discussed the assessment and treatment plan with the patient. The patient was provided an opportunity to ask questions and all were answered. The patient agreed with the plan and demonstrated an understanding of the instructions.   The patient was advised to call back or seek an in-person evaluation if the symptoms worsen or if the condition fails to improve as anticipated.  I provided 45 minutes of non-face-to-face time during this encounter.  Summary:  Pt says he has been experiencing "brain fog" in the early afternoon and  endorses periods of sadness, crying spell, constant worry about most things. Pt Expressed concern about trintillex prescription, plans to speak with Dr Adele Schilder about weaning off medication. Pt says he feels the medication is contributing to depressive state. Additionally, pt Reports a decrease in pressure in between eyes that he believes occurs when he experiences increased anxiety. Pt says he Feels grounding techniques have been beneficial to manage anxiety.  Suicidal/Homicidal: Pt denies SI/HI no plan intent or attempt to harm self or others reported.  Therapist Response: Utilized CBT  decatastophisizing to challenge irrational thinking. Processed with pt how catastrophizing, the importance of a problem is exaggerated, or the worst possible outcome is assumed to be true. Discussed importance of questioning irrational thoughts and putting them on trial to correct cognitive distortions.  Plan: Return again in 1 weeks.  Diagnosis: Axis I: Generalized anxiety disorder    Panic attack    Axis II: No diagnosis    Yvette Rack, LCSW 11/05/2021

## 2021-11-11 ENCOUNTER — Ambulatory Visit (HOSPITAL_COMMUNITY): Payer: Medicare HMO | Admitting: Clinical

## 2021-11-11 ENCOUNTER — Other Ambulatory Visit: Payer: Self-pay

## 2021-11-14 ENCOUNTER — Encounter (HOSPITAL_COMMUNITY): Payer: Self-pay | Admitting: Psychiatry

## 2021-11-14 ENCOUNTER — Other Ambulatory Visit: Payer: Self-pay

## 2021-11-14 ENCOUNTER — Telehealth (HOSPITAL_BASED_OUTPATIENT_CLINIC_OR_DEPARTMENT_OTHER): Payer: Medicare HMO | Admitting: Psychiatry

## 2021-11-14 VITALS — Wt 205.0 lb

## 2021-11-14 DIAGNOSIS — F411 Generalized anxiety disorder: Secondary | ICD-10-CM

## 2021-11-14 DIAGNOSIS — R251 Tremor, unspecified: Secondary | ICD-10-CM

## 2021-11-14 DIAGNOSIS — F410 Panic disorder [episodic paroxysmal anxiety] without agoraphobia: Secondary | ICD-10-CM

## 2021-11-14 MED ORDER — TRAZODONE HCL 100 MG PO TABS: 100.0000 mg | ORAL_TABLET | Freq: Every day | ORAL | 1 refills | Status: DC

## 2021-11-14 MED ORDER — ESCITALOPRAM OXALATE 5 MG PO TABS: 5.0000 mg | ORAL_TABLET | Freq: Every day | ORAL | 1 refills | Status: DC

## 2021-11-14 MED ORDER — LORAZEPAM 1 MG PO TABS: 1.0000 mg | ORAL_TABLET | Freq: Two times a day (BID) | ORAL | 1 refills | Status: DC | PRN

## 2021-11-14 NOTE — Progress Notes (Signed)
Virtual Visit via Telephone Note  I connected with [name] Parenteau Sr. on 11/14/21 at 10:40 AM EST by telephone and verified that I am speaking with the correct person using two identifiers.  Location: Patient: [name] [name] Provider: Home Office   I discussed the limitations, risks, security and privacy concerns of performing an evaluation and management service by telephone and the availability of in person appointments. I also discussed with the patient that there may be a patient responsible charge related to this service. The patient expressed understanding and agreed to proceed.   History of Present Illness: Patient is evaluated by phone session.  He reported his Christmas was okay.  He went to his mother and saw his sister.  Patient told they have COVID but now they are getting better.  Patient continued to endorse chronic anxiety and recalls 1 night he did not took his Ativan and Trintellix and had severe withdrawal symptoms.  He also reported his PCP stopped propranolol because he claimed it caused panic attack.  Patient now trying to cut down his Trintellix and only taking 2.5 mg.  He like to try a new medication to help his anxiety.  When I asked why he is reducing the Trintellix he claimed that it is causing tremors and depression.  We have recommended to see neurologist for the tremors.  Patient told they have to call once and he missed a call and since then they have not called back.  He is in therapy with Sanjuana Kava.  He is still have anxiety, nervousness.  However he denies any suicidal thoughts, hallucination or any paranoia.  His appetite is okay.  His sleep most of the time is good.  His weight is stable.  He lives with his wife.  Patient now like to try Lexapro which he recall has not tried before.  Past psychiatric history; H/O anxiety since early 35s.  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  No h/o mania, psychosis, suicidal attempt, inpatient treatment.   Seen and managed by PCP since early 32s. Tried Remeron, hydroxyzine, gabapentin, Paxil, Prozac, Zoloft, Cymbalta and Lamictal.  Trintellix helped but in the emergency room it was discontinued as patient complaining of tremors.  Pristiq could not tolerate more than 25 mg.   Gene-Sight shows Cymbalta, mirtazapine, Paxil, Luvox, clozapine, olanzapine, thiothixene has significant drug interaction.    Psychiatric Specialty Exam: Physical Exam  Review of Systems  Weight 205 lb (93 kg).There is no height or weight on file to calculate BMI.  General Appearance: NA  Eye Contact:  NA  Speech:  Slow  Volume:  Normal  Mood:  Anxious  Affect:  NA  Thought Process:  Goal Directed  Orientation:  Full (Time, Place, and Person)  Thought Content:  Rumination  Suicidal Thoughts:  No  Homicidal Thoughts:  No  Memory:  Immediate;   Good Recent;   Good Remote;   Good  Judgement:  Fair  Insight:  Shallow  Psychomotor Activity:  NA  Concentration:  Concentration: Good and Attention Span: Good  Recall:  Good  Fund of Knowledge:  Good  Language:  Good  Akathisia:   mild tremors on and off  Handed:  Right  AIMS (if indicated):     Assets:  Communication Skills Desire for Improvement Housing Social Support  ADL's:  Intact  Cognition:  WNL  Sleep:   ok      Assessment and Plan: Generalized anxiety disorder.  Panic attacks.  Tremors.  I reviewed his current medication.  Patient now like to try Lexapro because he believed Trintellix causing increased tremors and depression.  Even though he recalled 1 night he did not to Trintellix and his anxiety got worse.  I explained again about his medication trial, side effects and benefits.  We did genesight testing and most of the antianxiety and antidepressant are favorable.  He also had tried multiple antidepressant.  I explained that his body may be very sensitive with psychotropic medication and trying another SSRI may cause worsening of tremors.  However  patient like to try Lexapro anyway.  He has cut down his Trintellix 2.5 mg and noticed worsening of depression and anxiety.  He also offered he should consider a second opinion for medication management.  The patient preferred to keep me as a provider but agreed to look around for a second opinion.  I also suggested that he need to be more active to call the neurology to get appointment.  He also agreed that he will call back to them.  I also encouraged to see Sanjuana Kava on a regular basis to help his coping skills.  Patient like to keep the trazodone and Ativan.  He also started taking magnesium to help his sleep.  Discussed medication side effects and benefits.  He is checking his blood pressure since he stopped the propranolol and he feels his vitals are stable.  He is a still taking other antihypertensive medication.  Follow up in 2 months.  I recommended to call us back if is any question or any concern.  Follow Up Instructions:    I discussed the assessment and treatment plan with the patient. The patient was provided an opportunity to ask questions and all were answered. The patient agreed with the plan and demonstrated an understanding of the instructions.   The patient was advised to call back or seek an in-person evaluation if the symptoms worsen or if the condition fails to improve as anticipated.  I provided 28 minutes of non-face-to-face time during this encounter.   [name], MD

## 2021-11-18 ENCOUNTER — Telehealth (HOSPITAL_COMMUNITY): Payer: Medicare HMO | Admitting: Psychiatry

## 2021-11-18 DIAGNOSIS — E538 Deficiency of other specified B group vitamins: Secondary | ICD-10-CM | POA: Diagnosis not present

## 2021-11-26 DIAGNOSIS — F1120 Opioid dependence, uncomplicated: Secondary | ICD-10-CM | POA: Diagnosis not present

## 2021-11-26 DIAGNOSIS — G894 Chronic pain syndrome: Secondary | ICD-10-CM | POA: Diagnosis not present

## 2021-12-16 DIAGNOSIS — E538 Deficiency of other specified B group vitamins: Secondary | ICD-10-CM | POA: Diagnosis not present

## 2021-12-17 ENCOUNTER — Other Ambulatory Visit (HOSPITAL_COMMUNITY): Payer: Self-pay | Admitting: Psychiatry

## 2021-12-17 DIAGNOSIS — F411 Generalized anxiety disorder: Secondary | ICD-10-CM

## 2021-12-17 DIAGNOSIS — F410 Panic disorder [episodic paroxysmal anxiety] without agoraphobia: Secondary | ICD-10-CM

## 2021-12-18 DIAGNOSIS — I10 Essential (primary) hypertension: Secondary | ICD-10-CM | POA: Diagnosis not present

## 2021-12-18 DIAGNOSIS — F321 Major depressive disorder, single episode, moderate: Secondary | ICD-10-CM | POA: Diagnosis not present

## 2021-12-18 DIAGNOSIS — E7800 Pure hypercholesterolemia, unspecified: Secondary | ICD-10-CM | POA: Diagnosis not present

## 2021-12-24 ENCOUNTER — Ambulatory Visit
Admission: RE | Admit: 2021-12-24 | Discharge: 2021-12-24 | Disposition: A | Discharge status: Home / Self Care | Payer: Medicare HMO | Source: Ambulatory Visit | Attending: Endocrinology | Admitting: Endocrinology

## 2021-12-24 DIAGNOSIS — E042 Nontoxic multinodular goiter: Secondary | ICD-10-CM | POA: Diagnosis not present

## 2021-12-25 ENCOUNTER — Other Ambulatory Visit: Payer: Medicare HMO

## 2021-12-27 DIAGNOSIS — Z20822 Contact with and (suspected) exposure to covid-19: Secondary | ICD-10-CM | POA: Diagnosis not present

## 2021-12-27 DIAGNOSIS — B349 Viral infection, unspecified: Secondary | ICD-10-CM | POA: Diagnosis not present

## 2022-01-03 DIAGNOSIS — H5200 Hypermetropia, unspecified eye: Secondary | ICD-10-CM | POA: Diagnosis not present

## 2022-01-08 DIAGNOSIS — E0590 Thyrotoxicosis, unspecified without thyrotoxic crisis or storm: Secondary | ICD-10-CM | POA: Diagnosis not present

## 2022-01-08 DIAGNOSIS — E042 Nontoxic multinodular goiter: Secondary | ICD-10-CM | POA: Diagnosis not present

## 2022-01-08 DIAGNOSIS — D3500 Benign neoplasm of unspecified adrenal gland: Secondary | ICD-10-CM | POA: Diagnosis not present

## 2022-01-10 DIAGNOSIS — H52223 Regular astigmatism, bilateral: Secondary | ICD-10-CM | POA: Diagnosis not present

## 2022-01-10 DIAGNOSIS — H524 Presbyopia: Secondary | ICD-10-CM | POA: Diagnosis not present

## 2022-01-10 DIAGNOSIS — Z961 Presence of intraocular lens: Secondary | ICD-10-CM | POA: Diagnosis not present

## 2022-01-13 ENCOUNTER — Telehealth (HOSPITAL_COMMUNITY): Payer: Self-pay | Admitting: *Deleted

## 2022-01-13 ENCOUNTER — Encounter (HOSPITAL_COMMUNITY): Payer: Self-pay | Admitting: Psychiatry

## 2022-01-13 ENCOUNTER — Telehealth (HOSPITAL_BASED_OUTPATIENT_CLINIC_OR_DEPARTMENT_OTHER): Payer: Medicare HMO | Admitting: Psychiatry

## 2022-01-13 ENCOUNTER — Other Ambulatory Visit: Payer: Self-pay

## 2022-01-13 DIAGNOSIS — F411 Generalized anxiety disorder: Secondary | ICD-10-CM

## 2022-01-13 DIAGNOSIS — F410 Panic disorder [episodic paroxysmal anxiety] without agoraphobia: Secondary | ICD-10-CM

## 2022-01-13 MED ORDER — LORAZEPAM 1 MG PO TABS: 1.0000 mg | ORAL_TABLET | Freq: Two times a day (BID) | ORAL | 1 refills | Status: DC | PRN

## 2022-01-13 MED ORDER — TRAZODONE HCL 100 MG PO TABS: 100.0000 mg | ORAL_TABLET | Freq: Every day | ORAL | 1 refills | Status: DC

## 2022-01-13 MED ORDER — ESCITALOPRAM OXALATE 5 MG PO TABS: 7.5000 mg | ORAL_TABLET | Freq: Every day | ORAL | 1 refills | Status: DC

## 2022-01-13 NOTE — Progress Notes (Signed)
Virtual Visit via Telephone Note ? ?I connected with [name] [name]. on 01/13/22 at 10:00 AM EDT by telephone and verified that I am speaking with the correct person using two identifiers. ? ?Location: ?Patient: Home ?Provider: Home office ?  ?I discussed the limitations, risks, security and privacy concerns of performing an evaluation and management service by telephone and the availability of in person appointments. I also discussed with the patient that there may be a patient responsible charge related to this service. The patient expressed understanding and agreed to proceed. ? ? ?History of Present Illness: ?Patient is evaluated by phone session.  He is taking Lexapro 5 mg along with Ativan and trazodone.  He noticed improvement in his anxiety and nervousness.  He occasionally has tremors but overall he feels things are going better.  He has not seen Darren in a while because he feels symptoms are improved.  He admitted in the beginning he was taking Ativan little bit more but now he is finally adjusting to the dose of Lexapro he is back to Ativan 1 mg 2 times a day.  He sleeps good.  He reported having an upcoming appointment to check his cortisol level and his abnormal gland.  Denies any hallucination, paranoia, suicidal thoughts.  His appetite is okay and his weight is stable.  He admitted occasionally sexual side effects of Lexapro but he was also given Viagra from his PCP which he has not used.  He denies any panic attack.  He denies any crying spells or any feeling of hopelessness. ?  ?Past psychiatric history; ?H/O anxiety since early 96s.  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  No h/o mania, psychosis, suicidal attempt, inpatient treatment.  Seen and managed by PCP since early 70s. Tried Remeron, hydroxyzine, gabapentin, Paxil, Prozac, Zoloft, Cymbalta and Lamictal.  Trintellix helped but in the emergency room it was discontinued as patient complaining of tremors.  Pristiq  could not tolerate more than 25 mg.   Gene-Sight shows Cymbalta, mirtazapine, Paxil, Luvox, clozapine, olanzapine, thiothixene has significant drug interaction.  ? ?Psychiatric Specialty Exam: ?Physical Exam  ?Review of Systems  ?Weight 200 lb (90.7 kg).There is no height or weight on file to calculate BMI.  ?General Appearance: NA  ?Eye Contact:  NA  ?Speech:  Slow  ?Volume:  Normal  ?Mood:  Anxious  ?Affect:  NA  ?Thought Process:  Goal Directed  ?Orientation:  Full (Time, Place, and Person)  ?Thought Content:  Rumination  ?Suicidal Thoughts:  No  ?Homicidal Thoughts:  No  ?Memory:  Immediate;   Good ?Recent;   Good ?Remote;   Fair  ?Judgement:  Intact  ?Insight:  Present  ?Psychomotor Activity:  NA  ?Concentration:  Concentration: Good and Attention Span: Good  ?Recall:  Good  ?Fund of Knowledge:  Good  ?Language:  Good  ?Akathisia:  No  ?Handed:  Right  ?AIMS (if indicated):     ?Assets:  Communication Skills ?Desire for Improvement ?Housing ?Social Support  ?ADL's:  Intact  ?Cognition:  WNL  ?Sleep:   7 hrs  ? ? ? ? ?Assessment and Plan: ?Generalized anxiety disorder.  Panic attacks. ? ?Patient is still have some residual anxiety and we talked about gradually increasing the dose of Lexapro.  He agreed to give a try since he feels Lexapro helping but is still have residual symptoms.  Given the history of sensitivity with the medication I recommend to try Lexapro 7.5 mg.  We  will continue trazodone 100 mg at bedtime and Ativan 1 mg twice a day.  We also discussed sexual side effects related to Lexapro but he has given Viagra from his PCP.  I recommend to have his blood work results faxed to [name] as patient going for blood work to check his cortisol level.  He does not feel he need to go back to see Darren at this time since symptoms are slowly and gradually improving.  Discussed medication side effects and benefits.  He is also on 2 antihypertensive medication and his blood pressure is stable.  Recommend to call us  back with any question or any concern.  Follow-up in 2 months. ? ?Follow Up Instructions: ? ?  ?I discussed the assessment and treatment plan with the patient. The patient was provided an opportunity to ask questions and all were answered. The patient agreed with the plan and demonstrated an understanding of the instructions. ?  ?The patient was advised to call back or seek an in-person evaluation if the symptoms worsen or if the condition fails to improve as anticipated. ? ?I provided 17 minutes of non-face-to-face time during this encounter. ? ? ?[name], MD  ?

## 2022-01-13 NOTE — Telephone Encounter (Signed)
Opened in error

## 2022-01-14 ENCOUNTER — Telehealth (HOSPITAL_COMMUNITY): Payer: Self-pay | Admitting: *Deleted

## 2022-01-14 DIAGNOSIS — E538 Deficiency of other specified B group vitamins: Secondary | ICD-10-CM | POA: Diagnosis not present

## 2022-01-14 DIAGNOSIS — E042 Nontoxic multinodular goiter: Secondary | ICD-10-CM | POA: Diagnosis not present

## 2022-01-14 NOTE — Telephone Encounter (Signed)
PA FOR LEXAPRO 5 MG #45 HAS BEEN APPROVED VIA [employer]. ? ?PA CASE # [id number]. ? ?APPROVED FROM 10/27/21 TO 11/26/21. ? ?PT NOTIFIED. ? ? ?

## 2022-01-14 NOTE — Telephone Encounter (Signed)
PA for Lexapro 5 mg (7.5 mg total) submitted via [employer].  ? ?Awaiting determination. Rx reference # [id number]. ?

## 2022-01-24 DIAGNOSIS — F321 Major depressive disorder, single episode, moderate: Secondary | ICD-10-CM | POA: Diagnosis not present

## 2022-01-24 DIAGNOSIS — E785 Hyperlipidemia, unspecified: Secondary | ICD-10-CM | POA: Diagnosis not present

## 2022-01-24 DIAGNOSIS — I10 Essential (primary) hypertension: Secondary | ICD-10-CM | POA: Diagnosis not present

## 2022-02-12 DIAGNOSIS — M5416 Radiculopathy, lumbar region: Secondary | ICD-10-CM | POA: Diagnosis not present

## 2022-02-12 DIAGNOSIS — M542 Cervicalgia: Secondary | ICD-10-CM | POA: Diagnosis not present

## 2022-02-12 DIAGNOSIS — G894 Chronic pain syndrome: Secondary | ICD-10-CM | POA: Diagnosis not present

## 2022-02-12 DIAGNOSIS — F1120 Opioid dependence, uncomplicated: Secondary | ICD-10-CM | POA: Diagnosis not present

## 2022-02-21 DIAGNOSIS — E538 Deficiency of other specified B group vitamins: Secondary | ICD-10-CM | POA: Diagnosis not present

## 2022-03-04 ENCOUNTER — Telehealth: Payer: Self-pay | Admitting: *Deleted

## 2022-03-04 ENCOUNTER — Other Ambulatory Visit: Payer: Self-pay

## 2022-03-04 DIAGNOSIS — Z87891 Personal history of nicotine dependence: Secondary | ICD-10-CM

## 2022-03-04 DIAGNOSIS — Z122 Encounter for screening for malignant neoplasm of respiratory organs: Secondary | ICD-10-CM

## 2022-03-04 NOTE — Telephone Encounter (Signed)
LMTC to schedule Yearly Lung CA CT Scan. 

## 2022-03-17 ENCOUNTER — Other Ambulatory Visit (HOSPITAL_COMMUNITY): Payer: Self-pay | Admitting: *Deleted

## 2022-03-17 DIAGNOSIS — F410 Panic disorder [episodic paroxysmal anxiety] without agoraphobia: Secondary | ICD-10-CM

## 2022-03-17 DIAGNOSIS — F411 Generalized anxiety disorder: Secondary | ICD-10-CM

## 2022-03-17 MED ORDER — LORAZEPAM 1 MG PO TABS: 1.0000 mg | ORAL_TABLET | Freq: Two times a day (BID) | ORAL | 0 refills | Status: DC | PRN

## 2022-03-17 MED ORDER — TRAZODONE HCL 100 MG PO TABS: 100.0000 mg | ORAL_TABLET | Freq: Every day | ORAL | 0 refills | Status: DC

## 2022-03-18 ENCOUNTER — Ambulatory Visit (INDEPENDENT_AMBULATORY_CARE_PROVIDER_SITE_OTHER)
Admission: RE | Admit: 2022-03-18 | Discharge: 2022-03-18 | Disposition: A | Discharge status: Home / Self Care | Payer: Medicare HMO | Source: Ambulatory Visit | Attending: Cardiology | Admitting: Cardiology

## 2022-03-18 DIAGNOSIS — Z87891 Personal history of nicotine dependence: Secondary | ICD-10-CM

## 2022-03-18 DIAGNOSIS — Z122 Encounter for screening for malignant neoplasm of respiratory organs: Secondary | ICD-10-CM

## 2022-03-20 ENCOUNTER — Other Ambulatory Visit: Payer: Self-pay

## 2022-03-20 DIAGNOSIS — Z87891 Personal history of nicotine dependence: Secondary | ICD-10-CM

## 2022-03-20 DIAGNOSIS — Z122 Encounter for screening for malignant neoplasm of respiratory organs: Secondary | ICD-10-CM

## 2022-03-25 DIAGNOSIS — E538 Deficiency of other specified B group vitamins: Secondary | ICD-10-CM | POA: Diagnosis not present

## 2022-03-26 DIAGNOSIS — I10 Essential (primary) hypertension: Secondary | ICD-10-CM | POA: Diagnosis not present

## 2022-03-26 DIAGNOSIS — F321 Major depressive disorder, single episode, moderate: Secondary | ICD-10-CM | POA: Diagnosis not present

## 2022-03-26 DIAGNOSIS — F411 Generalized anxiety disorder: Secondary | ICD-10-CM | POA: Diagnosis not present

## 2022-03-26 DIAGNOSIS — E7800 Pure hypercholesterolemia, unspecified: Secondary | ICD-10-CM | POA: Diagnosis not present

## 2022-03-31 ENCOUNTER — Encounter (HOSPITAL_COMMUNITY): Payer: Self-pay | Admitting: Psychiatry

## 2022-03-31 ENCOUNTER — Telehealth (HOSPITAL_BASED_OUTPATIENT_CLINIC_OR_DEPARTMENT_OTHER): Payer: Medicare HMO | Admitting: Psychiatry

## 2022-03-31 DIAGNOSIS — F410 Panic disorder [episodic paroxysmal anxiety] without agoraphobia: Secondary | ICD-10-CM

## 2022-03-31 DIAGNOSIS — F411 Generalized anxiety disorder: Secondary | ICD-10-CM | POA: Diagnosis not present

## 2022-03-31 MED ORDER — TRAZODONE HCL 100 MG PO TABS: 100.0000 mg | ORAL_TABLET | Freq: Every day | ORAL | 0 refills | Status: DC

## 2022-03-31 MED ORDER — ESCITALOPRAM OXALATE 5 MG PO TABS: 5.0000 mg | ORAL_TABLET | Freq: Every day | ORAL | 0 refills | Status: DC

## 2022-03-31 MED ORDER — LORAZEPAM 1 MG PO TABS: 1.0000 mg | ORAL_TABLET | Freq: Two times a day (BID) | ORAL | 2 refills | Status: DC | PRN

## 2022-03-31 NOTE — Progress Notes (Signed)
Virtual Visit via Telephone Note  I connected with [name] Copes Sr. on 03/31/22 at 10:00 AM EDT by telephone and verified that I am speaking with the correct person using two identifiers.  Location: Patient: Home Provider: Home Office   I discussed the limitations, risks, security and privacy concerns of performing an evaluation and management service by telephone and the availability of in person appointments. I also discussed with the patient that there may be a patient responsible charge related to this service. The patient expressed understanding and agreed to proceed.   History of Present Illness: Patient is evaluated by phone session.  On the last visit we tried Lexapro 7.5 mg however after taking for 1 week he started to have severe headaches and brain fog and went back to only 5 mg.  He is feeling better.  He still has some time brain fog and sexual side effects but he is taking Viagra that helps.  He is taking Ativan 1 mg 2 times a day.  He denies any major panic attack or nervousness.  He sleeps good.  He denies any hallucination, paranoia.  He feels the current medication regimen is working very well and he does not want to change.  He has good energy and he goes every day to drop his son to school.  Denies any worsening of tremors.  He is taking as needed Percocet for back pain.  His appetite is okay and he reported his weight is stable.  He denies drinking or using any illegal substances.  Past psychiatric history; H/O anxiety since early 47s.  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  No h/o mania, psychosis, suicidal attempt, inpatient treatment.  Seen and managed by PCP since early 55s. Tried Remeron, hydroxyzine, gabapentin, Paxil, Prozac, Zoloft, Cymbalta and Lamictal.  Trintellix helped but in the emergency room it was discontinued as patient complaining of tremors.  Pristiq could not tolerate more than 25 mg.   Gene-Sight shows Cymbalta, mirtazapine, Paxil,  Luvox, clozapine, olanzapine, thiothixene has significant drug interaction.    Psychiatric Specialty Exam: Physical Exam  Review of Systems  Weight 200 lb (90.7 kg).There is no height or weight on file to calculate BMI.  General Appearance: NA  Eye Contact:  NA  Speech:  Normal Rate  Volume:  Normal  Mood:  Euthymic  Affect:  NA  Thought Process:  Goal Directed  Orientation:  Full (Time, Place, and Person)  Thought Content:  Logical  Suicidal Thoughts:  No  Homicidal Thoughts:  No  Memory:  Immediate;   Good Recent;   Good Remote;   Good  Judgement:  Intact  Insight:  Present  Psychomotor Activity:  NA  Concentration:  Concentration: Good and Attention Span: Good  Recall:  [place] of Knowledge:  Good  Language:  Good  Akathisia:  No  Handed:  Right  AIMS (if indicated):     Assets:  Communication Skills Desire for Improvement Housing Social Support  ADL's:  Intact  Cognition:  WNL  Sleep:   7-8 hrs      Assessment and Plan: Generalized anxiety disorder.  Panic attacks.  Patient back to Lexapro 5 mg as 7.5 mg could not tolerate.  Like to keep the current regimen which is helping him.  Continue Lexapro 5 mg daily, trazodone 100 mg at bedtime and Ativan 1 mg twice a day.  He is getting Viagra from his PCP that is helping his sexual side effects  however lowering the dose of Lexapro also helps.  He is not interested in therapy since symptoms are stable.  Recommended to call us back if is any question or any concern.  Follow-up in 3 months.  Follow Up Instructions:    I discussed the assessment and treatment plan with the patient. The patient was provided an opportunity to ask questions and all were answered. The patient agreed with the plan and demonstrated an understanding of the instructions.   The patient was advised to call back or seek an in-person evaluation if the symptoms worsen or if the condition fails to improve as anticipated.  Collaboration of Care:  Primary Care Provider AEB notes are available in epic to review.  Patient/Guardian was advised Release of Information must be obtained prior to any record release in order to collaborate their care with an outside provider. Patient/Guardian was advised if they have not already done so to contact the registration department to sign all necessary forms in order for [name] to release information regarding their care.   Consent: Patient/Guardian gives verbal consent for treatment and assignment of benefits for services provided during this visit. Patient/Guardian expressed understanding and agreed to proceed.    I provided 19 minutes of non-face-to-face time during this encounter.   [name], MD

## 2022-04-07 DIAGNOSIS — E785 Hyperlipidemia, unspecified: Secondary | ICD-10-CM | POA: Diagnosis not present

## 2022-04-07 DIAGNOSIS — F321 Major depressive disorder, single episode, moderate: Secondary | ICD-10-CM | POA: Diagnosis not present

## 2022-04-07 DIAGNOSIS — I10 Essential (primary) hypertension: Secondary | ICD-10-CM | POA: Diagnosis not present

## 2022-04-08 DIAGNOSIS — Z79899 Other long term (current) drug therapy: Secondary | ICD-10-CM | POA: Diagnosis not present

## 2022-04-08 DIAGNOSIS — E538 Deficiency of other specified B group vitamins: Secondary | ICD-10-CM | POA: Diagnosis not present

## 2022-04-24 DIAGNOSIS — E538 Deficiency of other specified B group vitamins: Secondary | ICD-10-CM | POA: Diagnosis not present

## 2022-05-15 ENCOUNTER — Encounter (HOSPITAL_COMMUNITY): Payer: Self-pay | Admitting: Psychiatry

## 2022-05-15 ENCOUNTER — Telehealth (HOSPITAL_BASED_OUTPATIENT_CLINIC_OR_DEPARTMENT_OTHER): Payer: Medicare HMO | Admitting: Psychiatry

## 2022-05-15 DIAGNOSIS — F410 Panic disorder [episodic paroxysmal anxiety] without agoraphobia: Secondary | ICD-10-CM | POA: Diagnosis not present

## 2022-05-15 DIAGNOSIS — F411 Generalized anxiety disorder: Secondary | ICD-10-CM | POA: Diagnosis not present

## 2022-05-15 MED ORDER — LORAZEPAM 1 MG PO TABS: 1.0000 mg | ORAL_TABLET | Freq: Two times a day (BID) | ORAL | 2 refills | Status: DC | PRN

## 2022-05-15 MED ORDER — TRAZODONE HCL 100 MG PO TABS: 100.0000 mg | ORAL_TABLET | Freq: Every day | ORAL | 0 refills | Status: DC

## 2022-05-15 NOTE — Progress Notes (Signed)
Virtual Visit via Telephone Note  I connected with [name] [name]. on 05/15/22 at  1:00 PM EDT by telephone and verified that I am speaking with the correct person using two identifiers.  Location: Patient: Home Provider: Home Office   I discussed the limitations, risks, security and privacy concerns of performing an evaluation and management service by telephone and the availability of in person appointments. I also discussed with the patient that there may be a patient responsible charge related to this service. The patient expressed understanding and agreed to proceed.   History of Present Illness: Patient is evaluated by phone session.  He requested an earlier appointment as he reported he reported continued to have brain fog and it alternates week to week.  During the spell he gets very nervous and anxious and admitted took extra Ativan half tablet.  He is taking Lexapro 5 mg along with Ativan and trazodone.  Now he wants to come off from Lexapro because he read the side effects that Lexapro can cause brain fog.  He has tremors which he described gets sometimes worse.  We have referred to neurology and he saw Dr. Krista Blue but he was told it is medication related and at that time he was taking Pristiq.  Since the Pristiq has been discontinued he had still tremors.  Denies any crying spells or any feeling of hopelessness.  Sleep is fair.  He denies agitation, hallucination.   Past psychiatric history; H/O anxiety since early 82s.  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  No h/o mania, psychosis, suicidal attempt, inpatient treatment.  Seen and managed by PCP since early 60s. Tried Remeron, hydroxyzine, gabapentin, Paxil, Prozac, Zoloft, Cymbalta and Lamictal.  Trintellix helped but in the emergency room it was discontinued as patient complaining of tremors.  Pristiq could not tolerate more than 25 mg.   Gene-Sight shows Cymbalta, mirtazapine, Paxil, Luvox, clozapine,  olanzapine, thiothixene has significant drug interaction.   Psychiatric Specialty Exam: Physical Exam  Review of Systems  Weight 205 lb (93 kg).There is no height or weight on file to calculate BMI.  General Appearance: NA  Eye Contact:  NA  Speech:  Normal Rate  Volume:  Normal  Mood:  Anxious  Affect:  NA  Thought Process:  Goal Directed  Orientation:  Full (Time, Place, and Person)  Thought Content:  Rumination  Suicidal Thoughts:  No  Homicidal Thoughts:  No  Memory:  Immediate;   Good Recent;   Good Remote;   Fair  Judgement:  Intact  Insight:  Present  Psychomotor Activity:  NA  Concentration:  Concentration: Fair and Attention Span: Fair  Recall:  Good  Fund of Knowledge:  Good  Language:  Good  Akathisia:  No  Handed:  Right  AIMS (if indicated):     Assets:  Communication Skills Desire for Improvement Housing  ADL's:  Intact  Cognition:  WNL  Sleep:         Assessment and Plan: Panic attacks.  Generalized anxiety disorder.  Discussed continues to have brain fog and anxiety.  Patient now wants to cut down and stop the Lexapro because he read the side effects of the Lexapro and one of the side effect is painful.  Even though he has been taking the Lexapro for a while but wants to try coming off to see if break for gets better.  We have recommended to see a neurologist because of headaches, tremors and brain fog  but after visit with neurology he was not happy as he was told it is medication related.  At that time he was taking Pristiq.  He is no longer taking Pristiq but is still have tremors.  I recommend if he wants to stop the Lexapro then cut down to 2.5 and then to stop and call us back.  If the tremors and brain fog resolved that it is medication related however if the symptoms do not resolve and he started noticing worsening of symptoms or having more anxiety and panic attack then need to go back on Lexapro and try a second neurology opinion.  Patient agreed to  give [name] a call back.  For now we will continue Ativan 1 mg twice a day and trazodone 100 mg at bedtime.  I recommend not to take extra Ativan because that may cause worsening of brain fog.  He had tried numerous medication in the past.  He is excited about upcoming trip to [place] in second week of August.  Follow-up in 3 months.  Follow Up Instructions:    I discussed the assessment and treatment plan with the patient. The patient was provided an opportunity to ask questions and all were answered. The patient agreed with the plan and demonstrated an understanding of the instructions.   The patient was advised to call back or seek an in-person evaluation if the symptoms worsen or if the condition fails to improve as anticipated.  Collaboration of Care: Primary Care Provider AEB notes are available in epic to review.  Patient/Guardian was advised Release of Information must be obtained prior to any record release in order to collaborate their care with an outside provider. Patient/Guardian was advised if they have not already done so to contact the registration department to sign all necessary forms in order for [name] to release information regarding their care.   Consent: Patient/Guardian gives verbal consent for treatment and assignment of benefits for services provided during this visit. Patient/Guardian expressed understanding and agreed to proceed.    I provided 35 minutes of non-face-to-face time during this encounter.   [name], MD

## 2022-05-16 DIAGNOSIS — F1120 Opioid dependence, uncomplicated: Secondary | ICD-10-CM | POA: Diagnosis not present

## 2022-05-16 DIAGNOSIS — G894 Chronic pain syndrome: Secondary | ICD-10-CM | POA: Diagnosis not present

## 2022-05-16 DIAGNOSIS — M5416 Radiculopathy, lumbar region: Secondary | ICD-10-CM | POA: Diagnosis not present

## 2022-05-16 DIAGNOSIS — M542 Cervicalgia: Secondary | ICD-10-CM | POA: Diagnosis not present

## 2022-05-26 DIAGNOSIS — E538 Deficiency of other specified B group vitamins: Secondary | ICD-10-CM | POA: Diagnosis not present

## 2022-05-27 ENCOUNTER — Telehealth (HOSPITAL_COMMUNITY): Payer: Medicare HMO | Admitting: Psychiatry

## 2022-06-18 DIAGNOSIS — I10 Essential (primary) hypertension: Secondary | ICD-10-CM | POA: Diagnosis not present

## 2022-06-18 DIAGNOSIS — F321 Major depressive disorder, single episode, moderate: Secondary | ICD-10-CM | POA: Diagnosis not present

## 2022-06-18 DIAGNOSIS — E785 Hyperlipidemia, unspecified: Secondary | ICD-10-CM | POA: Diagnosis not present

## 2022-06-26 DIAGNOSIS — E538 Deficiency of other specified B group vitamins: Secondary | ICD-10-CM | POA: Diagnosis not present

## 2022-07-01 ENCOUNTER — Encounter (HOSPITAL_COMMUNITY): Payer: Self-pay | Admitting: Psychiatry

## 2022-07-01 ENCOUNTER — Telehealth (HOSPITAL_BASED_OUTPATIENT_CLINIC_OR_DEPARTMENT_OTHER): Payer: Medicare HMO | Admitting: Psychiatry

## 2022-07-01 ENCOUNTER — Telehealth (HOSPITAL_COMMUNITY): Payer: Self-pay | Admitting: *Deleted

## 2022-07-01 VITALS — Wt 200.0 lb

## 2022-07-01 DIAGNOSIS — R251 Tremor, unspecified: Secondary | ICD-10-CM

## 2022-07-01 DIAGNOSIS — F411 Generalized anxiety disorder: Secondary | ICD-10-CM

## 2022-07-01 DIAGNOSIS — F410 Panic disorder [episodic paroxysmal anxiety] without agoraphobia: Secondary | ICD-10-CM | POA: Diagnosis not present

## 2022-07-01 MED ORDER — TRAZODONE HCL 100 MG PO TABS: 100.0000 mg | ORAL_TABLET | Freq: Every day | ORAL | 0 refills | Status: DC

## 2022-07-01 MED ORDER — LORAZEPAM 1 MG PO TABS: 1.0000 mg | ORAL_TABLET | Freq: Two times a day (BID) | ORAL | 2 refills | Status: DC | PRN

## 2022-07-01 NOTE — Telephone Encounter (Signed)
Referral paperwork faxed to [facility] for second opinion on pt tremors and recurring headaches.

## 2022-07-01 NOTE — Progress Notes (Signed)
Virtual Visit via Telephone Note  I connected with [name] [name]. on 07/01/22 at 10:00 AM EDT by telephone and verified that I am speaking with the correct person using two identifiers.  Location: Patient: Home Provider: Home Office   I discussed the limitations, risks, security and privacy concerns of performing an evaluation and management service by telephone and the availability of in person appointments. I also discussed with the patient that there may be a patient responsible charge related to this service. The patient expressed understanding and agreed to proceed.   History of Present Illness: Patient is evaluated by phone session.  He had a beach trip and he did not want to cut down his Lexapro but started 7 days ago he is now taking 2.5 mg Lexapro.  He noticed some withdrawal symptoms as sometimes frequent awakening and bloating with nausea but no major other concerns.  He noticed may helpful for his brain fog but he cannot be sure but is still wants to slowly cut down his Lexapro.  He still has tremors and some time headaches.  He is taking Ativan and trazodone as prescribed.  He denies any major panic attack or any crying spells.  He still have anxiety but symptoms are stable.  He started work out every day and lost another 5 pounds.  His energy level is good.  He had a good beach trip.  We have recommended get second neurology opinion because previous neurologist recommended to stop the Pristiq because of the tremors.  However even stopping the Pristiq tremors did not go away.  Patient like [name] to send for second opinion from neurology.  Past psychiatric history; H/O anxiety since early 37s.  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  No h/o mania, psychosis, suicidal attempt, inpatient treatment.  Seen and managed by PCP since early 69s. Tried Remeron, hydroxyzine, gabapentin, Paxil, Prozac, Zoloft, Cymbalta and Lamictal.  Trintellix helped but in the emergency  room it was discontinued as patient complaining of tremors.  Pristiq could not tolerate more than 25 mg.   Gene-Sight shows Cymbalta, mirtazapine, Paxil, Luvox, clozapine, olanzapine, thiothixene has significant drug interaction.    Psychiatric Specialty Exam: Physical Exam  Review of Systems  Weight 200 lb (90.7 kg).There is no height or weight on file to calculate BMI.  General Appearance: NA  Eye Contact:  NA  Speech:  Normal Rate  Volume:  Normal  Mood:  Anxious  Affect:  NA  Thought Process:  Goal Directed  Orientation:  Full (Time, Place, and Person)  Thought Content:  Rumination  Suicidal Thoughts:  No  Homicidal Thoughts:  No  Memory:  Immediate;   Good Recent;   Good Remote;   Fair  Judgement:  Fair  Insight:  Present  Psychomotor Activity:  NA  Concentration:  Concentration: Fair and Attention Span: Fair  Recall:  Good  Fund of Knowledge:  Good  Language:  Good  Akathisia:   tremors  Handed:  Right  AIMS (if indicated):     Assets:  Communication Skills Desire for Improvement Housing Transportation  ADL's:  Intact  Cognition:  WNL  Sleep:   sometimes awakening      Assessment and Plan: Panic attacks.  Generalized anxiety disorder.  Tremors.  Patient cut down his Lexapro 2.5 mg 1 week ago.  He still has tremors but he believes his brain fog is slowly improving.  He is afraid stopping completely because he is having some  withdrawal symptoms currently on from 5 mg.  We encourage to go very slow as patient is very sensitive with medication.  Continue trazodone 100 mg at bedtime and lorazepam 1 mg twice a day.  We will refer him to [facility] neurology.  Follow-up in 2 months.  Patient will call us back if there is any question or any concern.  Encourage walking, exercise and watching his calorie intake.  His PCP is Dr. Barbaraann Share at [place] and he has no appointment until next year.  He is getting B12 injection.  Follow Up Instructions:    I discussed the  assessment and treatment plan with the patient. The patient was provided an opportunity to ask questions and all were answered. The patient agreed with the plan and demonstrated an understanding of the instructions.   The patient was advised to call back or seek an in-person evaluation if the symptoms worsen or if the condition fails to improve as anticipated.  I provided 15 minutes of non-face-to-face time during this encounter.  Collaboration of Care: Other provider involved in patient's care AEB notes are available in epic to review.  Patient/Guardian was advised Release of Information must be obtained prior to any record release in order to collaborate their care with an outside provider. Patient/Guardian was advised if they have not already done so to contact the registration department to sign all necessary forms in order for [name] to release information regarding their care.   Consent: Patient/Guardian gives verbal consent for treatment and assignment of benefits for services provided during this visit. Patient/Guardian expressed understanding and agreed to proceed.     [name], MD

## 2022-07-03 ENCOUNTER — Telehealth (HOSPITAL_COMMUNITY): Payer: Self-pay | Admitting: *Deleted

## 2022-07-03 ENCOUNTER — Other Ambulatory Visit (HOSPITAL_COMMUNITY): Payer: Self-pay | Admitting: *Deleted

## 2022-07-03 DIAGNOSIS — F411 Generalized anxiety disorder: Secondary | ICD-10-CM

## 2022-07-03 DIAGNOSIS — F410 Panic disorder [episodic paroxysmal anxiety] without agoraphobia: Secondary | ICD-10-CM

## 2022-07-03 MED ORDER — ESCITALOPRAM OXALATE 5 MG PO TABS: ORAL_TABLET | ORAL | 0 refills | Status: DC

## 2022-07-03 NOTE — Telephone Encounter (Signed)
Will reinforce PCP/UC. Script sent.

## 2022-07-03 NOTE — Telephone Encounter (Signed)
Pt called this nurse with c/o diarrhea, extreme gas pains, and bloating. Pt feels this is r/t tapering off the Lexapro 5 mg. He is currently taking 3/4 of a tablet daily. Pt is asking if you would send in a script for Bentyl stating that he has tried Immodium, Pepto Bismal, and probiotics. When writer advised he may want to contact PCP pt says that the GI issues are worse in the evening and he wouldn't be able to see PCP until tomorrow at the earliest. Pt also requests another script for the Lexapro 5 mg so he may taper down to 2.5 mg daily. Please review and advise.

## 2022-07-03 NOTE — Telephone Encounter (Signed)
I cannot write medication for his GI symptoms.  He need to contact his PCP or go to urgent care.  Please send Lexapro 5 mg to take half tablet daily # 30 as patient will slowly taper down his medication.

## 2022-07-04 DIAGNOSIS — R140 Abdominal distension (gaseous): Secondary | ICD-10-CM | POA: Diagnosis not present

## 2022-07-04 DIAGNOSIS — R197 Diarrhea, unspecified: Secondary | ICD-10-CM | POA: Diagnosis not present

## 2022-07-04 DIAGNOSIS — R1084 Generalized abdominal pain: Secondary | ICD-10-CM | POA: Diagnosis not present

## 2022-07-21 ENCOUNTER — Encounter: Payer: Self-pay | Admitting: Physician Assistant

## 2022-07-29 DIAGNOSIS — E538 Deficiency of other specified B group vitamins: Secondary | ICD-10-CM | POA: Diagnosis not present

## 2022-08-18 NOTE — Progress Notes (Deleted)
08/18/2022 [name] Bee Sr. [id number] 1961-04-06  Referring provider: Lujean Amel, MD Primary GI doctor: Dr. Fuller Plan  ASSESSMENT AND PLAN:   There are no diagnoses linked to this encounter.   Patient Care Team: Lujean Amel, MD as PCP - General (Family Medicine) Heath Lark, MD as Consulting Physician (Hematology and Oncology) Johnathan Hausen, MD as Consulting Physician (General Surgery) Frederik Pear, MD as Consulting Physician (Orthopedic Surgery) Erline Levine, MD as Consulting Physician (Neurosurgery) [name], MD as Consulting Physician (Psychiatry)  HISTORY OF PRESENT ILLNESS: 61 y.o. 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   1990s right hemicolectomy for large tubulovillous colon polyp 09/2010 EGD for GERD showed gastritis, duodenitis gastric biopsies benign gastric mucosa no H. pylori. 09/2010 colonoscopy ileitis, small adenomatous polyp, internal hemorrhoids.  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  Was on NSAIDs at that time. 11/16/2014  endoscopy unremarkable, no biopsy 12/14/2014 colonoscopy for IDA and personal history of adenomatous polyps, good prep movie prep, prior ileocolonic surgical anastomosis and segmental colon resection ascending colon, 2 adenomatous polyps sigmoid colon and descending colon, grade 1 internal hemorrhoids recall colonoscopy 5 years  Patient has not had celiac testing that I can see.  Has B12 def.   CBC  06/03/2021  HGB 12.6 MCV 85.7 with normocytic anemia WBC 7.5 Platelets 273 06/19/2021  B12 77 Kidney function  06/03/2021  BUN 8 Cr 0.76  GFR >60  Potassium 3.8   LFTs 06/03/2021  AST 26 ALT 30 Alkphos 66 TBili 0.7 11/12/2019 LIPASE 20 06/19/2021 TSH 0.494   He  reports that he quit smoking about 6 years ago. His smoking use included cigarettes. He has a 25.00 pack-year smoking history. He has never used smokeless tobacco. He reports current alcohol use. He reports that he does not use drugs.  Current Medications:    Current Outpatient Medications (Cardiovascular):    amLODipine (NORVASC) 10 MG tablet, Take 10 mg by mouth daily.    hydrochlorothiazide (HYDRODIURIL) 12.5 MG tablet, Take 3.125 mg by mouth daily.    propranolol (INDERAL) 20 MG tablet, 20 mg in the morning and at bedtime. (Patient not taking: Reported on 11/14/2021)   sildenafil (VIAGRA) 100 MG tablet, Take 100 mg by mouth daily as needed for erectile dysfunction.   telmisartan (MICARDIS) 80 MG tablet, Take 80 mg by mouth at bedtime.   Current Outpatient Medications (Analgesics):    ibuprofen (ADVIL) 200 MG tablet, Take 600 mg by mouth every 6 (six) hours as needed for moderate pain.   oxyCODONE-acetaminophen (PERCOCET) 10-325 MG tablet, Take 1 tablet by mouth every 6 (six) hours as needed for pain.   Current Outpatient Medications (Other):    betamethasone dipropionate 0.05 % cream, Apply 1 application topically 2 (two) times daily as needed (rash).   cyclobenzaprine (FLEXERIL) 10 MG tablet, Take 0.5-1 tablets (5-10 mg total) by mouth 3 (three) times daily as needed for muscle spasms.   escitalopram (LEXAPRO) 5 MG tablet, Take half of a tablet (2.5 mg total dose) by mouth daily.   famotidine (PEPCID) 40 MG tablet, Take 20 mg by mouth 2 (two) times daily.   LORazepam (ATIVAN) 1 MG tablet, Take  1 tablet (1 mg total) by mouth 2 (two) times daily as needed for anxiety.   Multiple Vitamin (MULTIVITAMIN WITH MINERALS) TABS, Take 1 tablet by mouth daily.   Probiotic Product (PROBIOTIC PO), Take 1 capsule by mouth daily.   psyllium  (METAMUCIL) 58.6 % packet, Take 1 packet by mouth every other day.   traZODone (DESYREL) 100 MG tablet, Take 1 tablet (100 mg total) by mouth at bedtime.  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   left   Erythrocytosis 11/30/2014   Essential hypertension    GERD (gastroesophageal reflux disease)    Headache(784.0)    2 x/week   History of kidney stones    Hypothyroidism    hx of    Leukocytosis, unspecified 11/03/2013   Limited joint range of motion    cervical fusion 02/2013   Medial meniscus tear 06/2013   left knee   Pheochromocytoma, right, 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 06/27/2017   Throat and mouth symptom    itchy throat x 3 weeks   Thyroid nodule    Tobacco abuse    Wears partial dentures    upper   Allergies:  Allergies  Allergen Reactions   Chantix [Varenicline] Palpitations    Heart race Increased panic attacks   Morphine Itching   Penicillins Rash    Has patient had a PCN reaction causing immediate rash, facial/tongue/throat swelling, SOB or lightheadedness with hypotension: Yes Has patient had a PCN reaction causing severe rash involving mucus membranes or skin necrosis: Unknown Has patient had a PCN reaction that required hospitalization: No Has patient had a PCN reaction occurring within the last 10 years: No If all of the above answers are "NO", then may proceed with Cephalosporin use. Tolerating Zosyn 05/2017      Surgical History:  He  has a past surgical history that includes Appendectomy; Anterior cervical decomp/discectomy fusion (N/A, 03/18/2013); Hemicolectomy (Right, 04/13/2000); Small intestine surgery (04/24/2000); Circumcision (05/04/2006); Shoulder arthroscopy w/ rotator cuff repair (Left, 07/16/2010); Inguinal hernia repair; Knee arthroscopy (Left, 08/01/2013); Upper gastrointestinal endoscopy; Colonoscopy; Eye surgery; Laparoscopic  Adrenalectomy (Right, 05/06/2017); laparotomy (N/A, 05/13/2017); laparotomy (N/A, 05/14/2017); laparotomy (N/A, 05/17/2017); laparotomy (N/A, 05/20/2017); laparotomy (N/A, 07/31/2017); laparoscopy (N/A, 07/31/2017); Laparoscopic lysis of adhesions (N/A, 07/31/2017); Insertion of mesh (N/A, 07/31/2017); Abdominal surgery; Cystoscopy/ureteroscopy/holmium laser/stent placement (Left, 11/24/2018); and Decompressive lumbar laminectomy level 1 (Right, 01/11/2020). Family History:  His family history includes Breast cancer (age of onset: 62) in his sister; Colon cancer (age of onset: 65) in his father.  REVIEW OF SYSTEMS  : All other systems reviewed and negative except where noted in the History of Present Illness.  PHYSICAL EXAM: There were no vitals taken for this visit. General:   Pleasant, well developed male in no acute distress Head:   Normocephalic and atraumatic. Eyes:  sclerae anicteric,conjunctive pink  Heart:   {HEART EXAM HEM/ONC:21750} Pulm:  Clear anteriorly; no wheezing Abdomen:   {BlankSingle:19197::"Distended","Ridged","Soft"}, {BlankSingle:19197::"Flat","Obese","Non-distended"} AB, {BlankSingle:19197::"Absent","Hyperactive, tinkling","Hypoactive","Sluggish","Active"} bowel sounds. {actendernessAB:27319} tenderness {anatomy; site abdomen:5010}. {BlankMultiple:19196::"Without guarding","With guarding","Without rebound","With rebound"}, No organomegaly appreciated. Rectal: {acrectalexam:27461} Extremities:  {With/Without:304960234} edema. Msk: Symmetrical without gross deformities. Peripheral pulses intact.  Neurologic:  Alert and  oriented x4;  No focal deficits.  Skin:   Dry and intact without significant lesions or rashes. Psychiatric:  Cooperative. Normal mood and affect.    Vladimir Crofts, PA-C 10:29 AM

## 2022-08-19 ENCOUNTER — Ambulatory Visit: Payer: Medicare HMO | Admitting: Physician Assistant

## 2022-08-21 DIAGNOSIS — F1120 Opioid dependence, uncomplicated: Secondary | ICD-10-CM | POA: Diagnosis not present

## 2022-08-21 DIAGNOSIS — G894 Chronic pain syndrome: Secondary | ICD-10-CM | POA: Diagnosis not present

## 2022-08-21 DIAGNOSIS — M5416 Radiculopathy, lumbar region: Secondary | ICD-10-CM | POA: Diagnosis not present

## 2022-08-28 DIAGNOSIS — E538 Deficiency of other specified B group vitamins: Secondary | ICD-10-CM | POA: Diagnosis not present

## 2022-09-01 ENCOUNTER — Encounter (HOSPITAL_COMMUNITY): Payer: Self-pay | Admitting: Psychiatry

## 2022-09-01 ENCOUNTER — Telehealth (HOSPITAL_BASED_OUTPATIENT_CLINIC_OR_DEPARTMENT_OTHER): Payer: Medicare HMO | Admitting: Psychiatry

## 2022-09-01 ENCOUNTER — Telehealth (HOSPITAL_COMMUNITY): Payer: Self-pay | Admitting: *Deleted

## 2022-09-01 DIAGNOSIS — F410 Panic disorder [episodic paroxysmal anxiety] without agoraphobia: Secondary | ICD-10-CM

## 2022-09-01 DIAGNOSIS — F411 Generalized anxiety disorder: Secondary | ICD-10-CM

## 2022-09-01 MED ORDER — TRAZODONE HCL 100 MG PO TABS: 100.0000 mg | ORAL_TABLET | Freq: Every day | ORAL | 0 refills | Status: DC

## 2022-09-01 MED ORDER — LORAZEPAM 1 MG PO TABS: 1.0000 mg | ORAL_TABLET | Freq: Two times a day (BID) | ORAL | 1 refills | Status: DC | PRN

## 2022-09-01 NOTE — Progress Notes (Signed)
Virtual Visit via Telephone Note  I connected with [name] [name]. on 09/01/22 at 10:40 AM EST by telephone and verified that I am speaking with the correct person using two identifiers.  Location: Patient: Home Provider: Home Office   I discussed the limitations, risks, security and privacy concerns of performing an evaluation and management service by telephone and the availability of in person appointments. I also discussed with the patient that there may be a patient responsible charge related to this service. The patient expressed understanding and agreed to proceed.   History of Present Illness: Patient is evaluated by video session.  He is taking one fourth of the Lexapro and since he is cutting down he notices anxiety and tingling coming back.  His GI symptoms are much better but is still have tingling.  However he had decided to stop the Lexapro and of this week.  He is taking Ativan 1 mg 2 times a day.  Now he would like to go back on Pristiq which he felt it did help but stopped after the tremors.  He still have chronic tremors and sometimes he feels tremors coming from inside.  I encouraged should see the neurology before changing the medication.  We have given referral but patient has not heard back from the neurology.  He sleeps 6 hours but noticed since cutting down the Lexapro with increased anxiety his sleep has been erratic.  However he noticed his brain fog is much better.  He has tremors which he reported comes and goes.  Denies any hallucination, paranoia, suicidal thoughts.  His energy level is okay.  Patient does drive and that is his outlet and he feels relaxed.  He is not drinking or using any illegal substances.  He denies any major panic attacks but endorsed general anxiety.  Past psychiatric history; H/O anxiety since early 70s.  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  No h/o mania, psychosis, suicidal attempt, inpatient treatment.  Seen and  managed by PCP since early 60s. Tried Remeron, hydroxyzine, gabapentin, Paxil, Prozac, Zoloft, Cymbalta and Lamictal.  Trintellix helped but in the emergency room it was discontinued as patient complaining of tremors.  Pristiq could not tolerate more than 25 mg.   Gene-Sight shows Cymbalta, mirtazapine, Paxil, Luvox, clozapine, olanzapine, thiothixene has significant drug interaction.    Psychiatric Specialty Exam: Physical Exam  Review of Systems  Weight 200 lb (90.7 kg).There is no height or weight on file to calculate BMI.  General Appearance: Casual  Eye Contact:  Fair  Speech:  Normal Rate  Volume:  Normal  Mood:  Anxious  Affect:  Congruent  Thought Process:  Goal Directed  Orientation:  Full (Time, Place, and Person)  Thought Content:  Rumination  Suicidal Thoughts:  No  Homicidal Thoughts:  No  Memory:  Immediate;   Good Recent;   Good Remote;   Good  Judgement:  Good  Insight:  Present  Psychomotor Activity:  Normal  Concentration:  Concentration: Good and Attention Span: Good  Recall:  [place] of Knowledge:  Good  Language:  Good  Akathisia:  No  Handed:  Right  AIMS (if indicated):     Assets:  Communication Skills Desire for Improvement Housing Resilience Social Support Transportation  ADL's:  Intact  Cognition:  WNL  Sleep:   6 hrs      Assessment and Plan: Panic attacks.  Generalized anxiety disorder.  Tremors.  Patient is still taking  Lexapro quarter of a pill but hoping to come off from the medication of this week.  He would like to go back on Pristiq however I recommend should get a neurology consult first before we try any psychotropic medication.  Patient had tried multiple medication and he had tremors and some of the medication he stopped on his own because of the tremors.  We will contact neurology again to get appointment.  For now continue trazodone 100 mg at bedtime and Ativan 1 mg 2 times a day.  Encourage walking, exercise and breathing  technique when he had anxiety.  We also discussed beta-blocker but patient had tried propranolol that did not help.  I recommend consider Topamax with a neurologist when the schedule for appointment.  Patient is taking pain medication, muscle relaxant, antihypertensive medication.  His GI symptoms are much resolved.  Patient like to have a follow-up in 4 weeks.    Follow Up Instructions:    I discussed the assessment and treatment plan with the patient. The patient was provided an opportunity to ask questions and all were answered. The patient agreed with the plan and demonstrated an understanding of the instructions.   The patient was advised to call back or seek an in-person evaluation if the symptoms worsen or if the condition fails to improve as anticipated.  Collaboration of Care: Other provider involved in patient's care AEB notes are available in epic to review.  Patient/Guardian was advised Release of Information must be obtained prior to any record release in order to collaborate their care with an outside provider. Patient/Guardian was advised if they have not already done so to contact the registration department to sign all necessary forms in order for [name] to release information regarding their care.   Consent: Patient/Guardian gives verbal consent for treatment and assignment of benefits for services provided during this visit. Patient/Guardian expressed understanding and agreed to proceed.    I provided 24 minutes of non-face-to-face time during this encounter.   [name], MD

## 2022-09-01 NOTE — Telephone Encounter (Signed)
Second referral faxed to [facility] for evaluation of tremors.

## 2022-09-24 ENCOUNTER — Encounter: Payer: Self-pay | Admitting: Gastroenterology

## 2022-09-24 ENCOUNTER — Ambulatory Visit: Payer: Medicare HMO | Admitting: Gastroenterology

## 2022-09-24 VITALS — BP 120/90 | HR 98 | Ht 71.0 in | Wt 203.0 lb

## 2022-09-24 DIAGNOSIS — J449 Chronic obstructive pulmonary disease, unspecified: Secondary | ICD-10-CM | POA: Diagnosis not present

## 2022-09-24 DIAGNOSIS — K529 Noninfective gastroenteritis and colitis, unspecified: Secondary | ICD-10-CM | POA: Diagnosis not present

## 2022-09-24 DIAGNOSIS — Z86010 Personal history of colonic polyps: Secondary | ICD-10-CM

## 2022-09-24 DIAGNOSIS — R109 Unspecified abdominal pain: Secondary | ICD-10-CM | POA: Diagnosis not present

## 2022-09-24 DIAGNOSIS — R140 Abdominal distension (gaseous): Secondary | ICD-10-CM | POA: Diagnosis not present

## 2022-09-24 MED ORDER — PLENVU 140 G PO SOLR: 1.0000 | Freq: Once | ORAL | 0 refills | Status: AC

## 2022-09-24 NOTE — Progress Notes (Signed)
09/24/2022 [name] Bee Sr. [id number] 1961-01-12   HISTORY OF PRESENT ILLNESS: This is a 61 year old male is a patient of Dr. Lavina Hamman.  He is here today to talk about and schedule colonoscopy as he is almost 3 years overdue for surveillance.  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  Looks like he had wound dehiscence and was in the hospital for about 4 months.  He says that since then he has had issues with ongoing diarrhea, abdominal bloating, and generalized abdominal pain.  The symptoms tend to be worse in the evenings/late in the day.  He says that his stools smell extremely foul.  He says that every once in a while he will get 2 or 3 days where his symptoms improve, but then they return again.  Colonoscopy 11/2014:  ENDOSCOPIC IMPRESSION: 1. Prior ileocolonic surgical anastomosis and segmental colon resection in the ascending colon 2. Two sessile polyps in the sigmoid colon and descending colon; polypectomies performed with a cold snare-tubular adenomas 5. 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   left   Erythrocytosis 11/30/2014   Essential hypertension    GERD (gastroesophageal reflux disease)    Headache(784.0)    2 x/week   History of kidney stones    Hypothyroidism    hx of    Leukocytosis, unspecified 11/03/2013   Limited joint range of motion    cervical fusion 02/2013   Medial meniscus tear 06/2013   left knee   Pheochromocytoma, right, 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 06/27/2017   Throat and mouth symptom    itchy throat x 3 weeks   Thyroid nodule    Tobacco abuse    Wears partial dentures    upper   Past Surgical History:  Procedure  Laterality Date   ABDOMINAL SURGERY     ANTERIOR CERVICAL DECOMP/DISCECTOMY FUSION N/A 03/18/2013   Procedure: ANTERIOR CERVICAL DECOMPRESSION/DISCECTOMY FUSION 3 LEVELS;  Surgeon: Erline Levine, MD;  Location: [facility];  Service: Neurosurgery;  Laterality: N/A;  Anterior Cervical Three-Six  Decompression/Diskectomy/Fusion   APPENDECTOMY     CIRCUMCISION  05/04/2006   COLONOSCOPY     CYSTOSCOPY/URETEROSCOPY/HOLMIUM LASER/STENT PLACEMENT Left 11/24/2018   Procedure: CYSTOSCOPY/URETEROSCOPY/HOLMIUM LASER/STENT PLACEMENT;  Surgeon: Lucas Mallow, MD;  Location: [facility];  Service: Urology;  Laterality: Left;   DECOMPRESSIVE LUMBAR LAMINECTOMY LEVEL 1 Right 01/11/2020   Procedure: RIGHT LUMBAR FOUR-FIVE MINIMALLY INVASIVE LUMBAR DISCECTOMY;  Surgeon: Judith Part, MD;  Location: [place];  Service: Neurosurgery;  Laterality: Right;   EYE SURGERY     bilateral cataract surgery    HEMICOLECTOMY Right 0/07/2724   with ileocolic anastomosis   INGUINAL HERNIA REPAIR     INSERTION OF MESH N/A 07/31/2017   Procedure: INSERTION OF MESH;  Surgeon: Johnathan Hausen, MD;  Location: [facility];  Service: General;  Laterality: N/A;   KNEE ARTHROSCOPY Left 08/01/2013   Procedure: LEFT ARTHROSCOPY KNEE;  Surgeon: Kerin Salen, MD;  Location: [place];  Service: Orthopedics;  Laterality: Left;   LAPAROSCOPIC ADRENALECTOMY Right 05/06/2017   Procedure: OPEN RIGHT ADRENALECTOMY WITH SMALL BOWEL RESECTION AND ANASTOMOSIS;  Surgeon:  [name], Arta Bruce, MD;  Location: [facility];  Service: General;  Laterality: Right;   LAPAROSCOPIC LYSIS OF ADHESIONS N/A 07/31/2017   Procedure: LYSIS OF ADHESIONS;  Surgeon: Johnathan Hausen, MD;  Location: [facility];  Service: General;  Laterality: N/A;   LAPAROSCOPY N/A 07/31/2017   Procedure: LAPAROSCOPY DIAGNOSTIC;  Surgeon: Johnathan Hausen, MD;  Location: [facility];  Service: General;  Laterality: N/A;   LAPAROTOMY N/A 05/13/2017   Procedure:  EXPLORATORY LAPAROTOMY, with wound dehisense, and repair of anastamotic leak;  Surgeon: [name], Arta Bruce, MD;  Location: [facility];  Service: General;  Laterality: N/A;   LAPAROTOMY N/A 05/14/2017   Procedure: EXPLORATORY LAPAROTOMY, LYSIS OF ADHESIONS/ SMALL BOWEL RESECTION/ WASHOUT OF ABDOMEN AND PLACEMENT OF NEGATIVE PRESSURE DRESSING;  Surgeon: Kieth Brightly, Arta Bruce, MD;  Location: [facility];  Service: General;  Laterality: N/A;   LAPAROTOMY N/A 05/17/2017   Procedure: EXPLORATORY LAPAROTOMY, PLACEMENT OF ILEOSTOMY TUBES, PARTIAL CLOSURE OF FASCIA, WOUND VAC EXCHANGE;  Surgeon: [name], Arta Bruce, MD;  Location: [facility];  Service: General;  Laterality: N/A;   LAPAROTOMY N/A 05/20/2017   Procedure: ABDOMINAL WOUND [place] OUT, WOUND CLOSURE AND WOUND [place];  Surgeon: [name], Arta Bruce, MD;  Location: [facility];  Service: General;  Laterality: N/A;   LAPAROTOMY N/A 07/31/2017   Procedure: EXPLORATORY LAPAROTOMY WITH CLOSURE OF ENTEROTOMICS;  Surgeon: Johnathan Hausen, MD;  Location: [facility];  Service: General;  Laterality: N/A;   SHOULDER ARTHROSCOPY W/ ROTATOR CUFF REPAIR Left 07/16/2010   SMALL INTESTINE SURGERY  04/24/2000   adhesiolysis, ileocolic anastomosis   UPPER GASTROINTESTINAL ENDOSCOPY      reports that he quit smoking about 6 years ago. His smoking use included cigarettes. He has a 25.00 pack-year smoking history. He has never used smokeless tobacco. He reports current alcohol use. He reports that he does not use drugs. family history includes Breast cancer (age of onset: 66) in his sister; Colon cancer (age of onset: 69) in his father. Allergies  Allergen Reactions   Chantix [Varenicline] Palpitations    Heart race Increased panic attacks   Morphine Itching   Penicillins Rash    Has patient had a PCN reaction causing immediate rash, facial/tongue/throat swelling, SOB or lightheadedness with hypotension: Yes Has patient had a PCN reaction causing severe rash involving mucus  membranes or skin necrosis: Unknown Has patient had a PCN reaction that required hospitalization: No Has patient had a PCN reaction occurring within the last 10 years: No If all of the above answers are "NO", then may proceed with Cephalosporin use. Tolerating Zosyn 05/2017       Outpatient Encounter Medications as of 09/24/2022  Medication Sig   amLODipine (NORVASC) 10 MG tablet Take 10 mg by mouth daily.    cyclobenzaprine (FLEXERIL) 10 MG tablet Take 0.5-1 tablets (5-10 mg total) by mouth 3 (three) times daily as needed for muscle spasms.   famotidine (PEPCID) 40 MG tablet Take 20 mg by mouth 2 (two) times daily.   hydrochlorothiazide (HYDRODIURIL) 12.5 MG tablet Take 3.125 mg by mouth daily.    ibuprofen (ADVIL) 200 MG tablet Take 600 mg by mouth every 6 (six) hours as needed for moderate pain.   LORazepam (ATIVAN) 1 MG tablet Take 1 tablet (1 mg total) by mouth 2 (two) times daily as needed for anxiety.   Multiple Vitamin (MULTIVITAMIN WITH MINERALS) TABS Take 1 tablet by mouth daily.   oxyCODONE-acetaminophen (PERCOCET) 10-325 MG tablet Take 1 tablet by mouth every 6 (six) hours as needed for pain.  Probiotic Product (PROBIOTIC PO) Take 1 capsule by mouth daily.   propranolol (INDERAL) 20 MG tablet 20 mg in the morning and at bedtime.   psyllium (METAMUCIL) 58.6 % packet Take 1 packet by mouth every other day.   sildenafil (VIAGRA) 100 MG tablet Take 100 mg by mouth daily as needed for erectile dysfunction.   telmisartan (MICARDIS) 80 MG tablet Take 80 mg by mouth at bedtime.   traZODone (DESYREL) 100 MG tablet Take 1 tablet (100 mg total) by mouth at bedtime.   betamethasone dipropionate 0.05 % cream Apply 1 application topically 2 (two) times daily as needed (rash). (Patient not taking: Reported on 09/24/2022)   escitalopram (LEXAPRO) 5 MG tablet Take half of a tablet (2.5 mg total dose) by mouth daily. (Patient not taking: Reported on 09/24/2022)   [DISCONTINUED] simethicone  (MYLICON) 80 MG chewable tablet Chew 80 mg by mouth every 6 (six) hours as needed for flatulence.   No facility-administered encounter medications on file as of 09/24/2022.     REVIEW OF SYSTEMS  : All other systems reviewed and negative except where noted in the History of Present Illness.   PHYSICAL EXAM: BP (!) 120/90   Pulse 98   Ht '5\' 11"'$  (1.803 m)   Wt 203 lb (92.1 kg)   BMI 28.31 kg/m  General: Well developed male in no acute distress Head: Normocephalic and atraumatic Eyes:  Sclerae anicteric, conjunctiva pink. Ears: Normal auditory acuity Lungs: Clear throughout to auscultation Heart: Regular rate and rhythm Abdomen: Soft, nontender, non distended. No masses or hepatomegaly noted. Normal bowel sounds Rectal:  Will be done at the time of colonoscopy. Musculoskeletal: Symmetrical with no gross deformities  Skin: No lesions on visible extremities Extremities: No edema  Neurological: Alert oriented x 4, grossly non-focal Psychological:  Alert and cooperative. Normal mood and affect  ASSESSMENT AND PLAN: *Personal history of adenomatous colon polyps: Last colonoscopy February 2016 with a repeat recommended a 5-year interval.  We will schedule that with Dr. Fuller Plan.  The risks, benefits, and alternatives to colonoscopy were discussed with the patient and he consents to proceed.  *Chronic diarrhea, abdominal bloating, and generalized abdominal pain that have been present and occur on a daily basis usually more so in the evenings since his multiple small bowel resections and abdominal surgeries and 2018.  Some of his pain may be from scar tissue/adhesions, but some of the bloating and diarrhea I wonder if he could have SIBO from altered anatomy, etc.  Will check a SIBO breath test.  CC:  Koirala, D[place]as, MD

## 2022-09-24 NOTE — Patient Instructions (Addendum)
If you are age 62 or older, your body mass index should be between 23-30. Your Body mass index is 28.31 kg/m. If this is out of the aforementioned range listed, please consider follow up with your Primary Care Provider.  If you are age 72 or younger, your body mass index should be between 19-25. Your Body mass index is 28.31 kg/m. If this is out of the aformentioned range listed, please consider follow up with your Primary Care Provider.   ________________________________________________________   [name] [name] have been scheduled for a colonoscopy. Please follow written instructions given to you at your visit today.  Please pick up your prep supplies at the pharmacy within the next 1-3 days. If you use inhalers (even only as needed), please bring them with you on the day of your procedure.  You have been given a testing kit to check for small intestine bacterial overgrowth (SIBO) which is completed by a company named [facility]. Make sure to return your test in the mail using the return mailing label given to you along with the kit. Your demographic and insurance information have already been sent to the company and they should be in contact with you over the next 1-2 weeks regarding this test. [facility] will collect an upfront charge of $99.74 for commercial insurance plans and $209.74 is you are paying cash. Make sure to discuss with [facility] PRIOR to having the test to see if they have gotten information from your insurance company as to how much your testing will cost out of pocket, if any. Please keep in mind that you will be getting a call from phone number [phone] or a similar number. If you do not hear from them within this time frame, please call our office at [phone] or call [facility] directly at [phone].     Thank you for entrusting me with your care and for choosing [employer], [name] [name], P.A. - C.

## 2022-09-29 DIAGNOSIS — E538 Deficiency of other specified B group vitamins: Secondary | ICD-10-CM | POA: Diagnosis not present

## 2022-09-30 ENCOUNTER — Telehealth (HOSPITAL_BASED_OUTPATIENT_CLINIC_OR_DEPARTMENT_OTHER): Payer: Medicare HMO | Admitting: Psychiatry

## 2022-09-30 ENCOUNTER — Encounter (HOSPITAL_COMMUNITY): Payer: Self-pay | Admitting: Psychiatry

## 2022-09-30 VITALS — Wt 203.0 lb

## 2022-09-30 DIAGNOSIS — R251 Tremor, unspecified: Secondary | ICD-10-CM

## 2022-09-30 DIAGNOSIS — F410 Panic disorder [episodic paroxysmal anxiety] without agoraphobia: Secondary | ICD-10-CM

## 2022-09-30 DIAGNOSIS — F411 Generalized anxiety disorder: Secondary | ICD-10-CM | POA: Diagnosis not present

## 2022-09-30 MED ORDER — TRAZODONE HCL 100 MG PO TABS: 100.0000 mg | ORAL_TABLET | Freq: Every day | ORAL | 0 refills | Status: DC

## 2022-09-30 MED ORDER — LORAZEPAM 1 MG PO TABS: 1.0000 mg | ORAL_TABLET | Freq: Two times a day (BID) | ORAL | 1 refills | Status: DC | PRN

## 2022-09-30 NOTE — Progress Notes (Signed)
Virtual Visit via Video Note  I connected with [name] Kozub Sr. on 09/30/22 at 10:20 AM EST by a video enabled telemedicine application and verified that I am speaking with the correct person using two identifiers.  Location: Patient: Home Provider: Home Office   I discussed the limitations of evaluation and management by telemedicine and the availability of in person appointments. The patient expressed understanding and agreed to proceed.  History of Present Illness: Patient is evaluated by video session.  He continued to complain about tremors, tingling and brain fog.  He had not heard from neurology and we have called few times and some referral.  He appears frustrated because he still feels nervous and anxious and feels no one cares about his chronic symptoms.  He reported after the Thanksgiving he feels more anxious but did not provide the reason.  He is sleeping 5 to 6 hours.  I also recommended should consider PCP to discuss about propranolol.  He had a B12 injection yesterday but did not discuss with PCP about propranolol.  He is taking trazodone and Ativan.  He is not drinking or using any illegal substances.  He denies any suicidal thoughts.  He has chronic complain about tremors, tingling, brain fog which mostly neurological.  Denies any hallucination or any paranoia.  His appetite is okay.  His weight is unchanged from the past.  Past psychiatric history; H/O anxiety since early 27s.  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  No h/o mania, psychosis, suicidal attempt, inpatient treatment.  Seen and managed by PCP since early 60s. Tried Remeron, hydroxyzine, gabapentin, Paxil, Prozac, Zoloft, Cymbalta and Lamictal.  Trintellix helped but in the emergency room it was discontinued as patient complaining of tremors.  Pristiq could not tolerate more than 25 mg.   Gene-Sight shows Cymbalta, mirtazapine, Paxil, Luvox, clozapine, olanzapine, thiothixene has significant drug  interaction.   Psychiatric Specialty Exam: Physical Exam  Review of Systems  Neurological:  Positive for tremors.       Tingling    Weight 203 lb (92.1 kg).There is no height or weight on file to calculate BMI.  General Appearance: Casual  Eye Contact:  Good  Speech:  Slow  Volume:  Normal  Mood:  Anxious  Affect:  Congruent  Thought Process:  Goal Directed  Orientation:  Full (Time, Place, and Person)  Thought Content:  Rumination  Suicidal Thoughts:  No  Homicidal Thoughts:  No  Memory:  Immediate;   Good Recent;   Good Remote;   Good  Judgement:  Intact  Insight:  Present  Psychomotor Activity:  Normal  Concentration:  Concentration: Fair and Attention Span: Fair  Recall:  Good  Fund of Knowledge:  Good  Language:  Good  Akathisia:  No  Handed:  Right  AIMS (if indicated):     Assets:  Communication Skills Desire for Improvement Housing Transportation  ADL's:  Intact  Cognition:  WNL  Sleep:   6 hrs      Assessment and Plan: Panic attacks.  Generalized anxiety disorder.  Tremors.  Patient has chronic symptoms which is more neurological.  Recommend to keep the Ativan 1 mg 2 times a day.  Patient now wondering if trazodone causing tremors and I recommend he can try cutting down the trazodone to see if that helps.  Patient is afraid stopping or cutting down the trazodone may cause worsening of insomnia.  I encourage try to contact PCP and we will also  contact his PCP to expedite the neurology referral.  He still take one fourth Lexapro but like to come off.  He agreed to keep the trazodone 100 mg at bedtime and Ativan 1 mg 2 times a day.  He also promised to contact his PCP to discuss about propranolol to help his anxiety.  Patient has anemia and is getting B12 injection.  We will follow-up in 3 months after he had a neurology visit.  Recommend to call us back if there is any question or worsening of symptoms.    Follow Up Instructions:    I discussed the  assessment and treatment plan with the patient. The patient was provided an opportunity to ask questions and all were answered. The patient agreed with the plan and demonstrated an understanding of the instructions.   The patient was advised to call back or seek an in-person evaluation if the symptoms worsen or if the condition fails to improve as anticipated.  Collaboration of Care: Other provider involved in patient's care AEB notes are available in epic to review.  Patient/Guardian was advised Release of Information must be obtained prior to any record release in order to collaborate their care with an outside provider. Patient/Guardian was advised if they have not already done so to contact the registration department to sign all necessary forms in order for [name] to release information regarding their care.   Consent: Patient/Guardian gives verbal consent for treatment and assignment of benefits for services provided during this visit. Patient/Guardian expressed understanding and agreed to proceed.    I provided 18 minutes of non-face-to-face time during this encounter.   [name], MD

## 2022-10-13 ENCOUNTER — Telehealth: Payer: Self-pay | Admitting: Gastroenterology

## 2022-10-13 NOTE — Telephone Encounter (Signed)
Inbound call from patient stating that Plenvu will not be covered bu his insurance and is requesting a new prep be sent to the pharmacy. Please advise.

## 2022-10-13 NOTE — Telephone Encounter (Signed)
[facility]	[place] and spoke to [place]. Patient had not provided the Medicare coupon codes.  I gave her codes that she ran. Price to patient will be $60.  Nira Conn indicated she will inform the patient.

## 2022-10-16 ENCOUNTER — Other Ambulatory Visit: Payer: Self-pay

## 2022-10-16 ENCOUNTER — Encounter (HOSPITAL_BASED_OUTPATIENT_CLINIC_OR_DEPARTMENT_OTHER): Payer: Self-pay | Admitting: Emergency Medicine

## 2022-10-16 ENCOUNTER — Emergency Department (HOSPITAL_BASED_OUTPATIENT_CLINIC_OR_DEPARTMENT_OTHER): Payer: Medicare HMO

## 2022-10-16 ENCOUNTER — Emergency Department (HOSPITAL_BASED_OUTPATIENT_CLINIC_OR_DEPARTMENT_OTHER)
Admission: EM | Admit: 2022-10-16 | Discharge: 2022-10-16 | Disposition: A | Discharge status: Home / Self Care | Payer: Medicare HMO | Attending: Emergency Medicine | Admitting: Emergency Medicine

## 2022-10-16 DIAGNOSIS — N200 Calculus of kidney: Secondary | ICD-10-CM | POA: Diagnosis not present

## 2022-10-16 DIAGNOSIS — N1330 Unspecified hydronephrosis: Secondary | ICD-10-CM | POA: Diagnosis not present

## 2022-10-16 DIAGNOSIS — K437 Other and unspecified ventral hernia with gangrene: Secondary | ICD-10-CM | POA: Diagnosis not present

## 2022-10-16 DIAGNOSIS — N132 Hydronephrosis with renal and ureteral calculous obstruction: Secondary | ICD-10-CM | POA: Diagnosis not present

## 2022-10-16 DIAGNOSIS — R1032 Left lower quadrant pain: Secondary | ICD-10-CM | POA: Diagnosis present

## 2022-10-16 DIAGNOSIS — N134 Hydroureter: Secondary | ICD-10-CM | POA: Diagnosis not present

## 2022-10-16 DIAGNOSIS — K439 Ventral hernia without obstruction or gangrene: Secondary | ICD-10-CM

## 2022-10-16 LAB — BASIC METABOLIC PANEL
Anion gap: 12 (ref 5–15)
BUN: 8 mg/dL (ref 8–23)
CO2: 23 mmol/L (ref 22–32)
Calcium: 9.2 mg/dL (ref 8.9–10.3)
Chloride: 107 mmol/L (ref 98–111)
Creatinine, Ser: 0.89 mg/dL (ref 0.61–1.24)
GFR, Estimated: >60 mL/min (ref >60)
Glucose, Bld: 97 mg/dL (ref 70–99)
Potassium: 3.7 mmol/L (ref 3.5–5.1)
Sodium: 142 mmol/L (ref 135–145)

## 2022-10-16 LAB — URINALYSIS, ROUTINE W REFLEX MICROSCOPIC
Bacteria, UA: NONE SEEN
Bilirubin Urine: NEGATIVE
Glucose, UA: NEGATIVE mg/dL
Ketones, ur: NEGATIVE mg/dL
Leukocytes,Ua: NEGATIVE
Nitrite: NEGATIVE
Protein, ur: NEGATIVE mg/dL
RBC / HPF: >50 RBC/hpf — ABNORMAL HIGH (ref 0–5)
Specific Gravity, Urine: 1.015 (ref 1.005–1.030)
pH: 5.5 (ref 5.0–8.0)

## 2022-10-16 LAB — CBC WITH DIFFERENTIAL/PLATELET
Abs Immature Granulocytes: 0.01 10*3/uL (ref 0.00–0.07)
Basophils Absolute: 0 10*3/uL (ref 0.0–0.1)
Basophils Relative: 0 %
Eosinophils Absolute: 0.1 10*3/uL (ref 0.0–0.5)
Eosinophils Relative: 2 %
HCT: 41.9 % (ref 39.0–52.0)
Hemoglobin: 13.6 g/dL (ref 13.0–17.0)
Immature Granulocytes: 0 %
Lymphocytes Relative: 46 %
Lymphs Abs: 3.2 10*3/uL (ref 0.7–4.0)
MCH: 28 pg (ref 26.0–34.0)
MCHC: 32.5 g/dL (ref 30.0–36.0)
MCV: 86.4 fL (ref 80.0–100.0)
Monocytes Absolute: 0.6 10*3/uL (ref 0.1–1.0)
Monocytes Relative: 9 %
Neutro Abs: 3.1 10*3/uL (ref 1.7–7.7)
Neutrophils Relative %: 43 %
Platelets: 263 10*3/uL (ref 150–400)
RBC: 4.85 MIL/uL (ref 4.22–5.81)
RDW: 14.7 % (ref 11.5–15.5)
WBC: 7.1 10*3/uL (ref 4.0–10.5)
nRBC: 0 % (ref 0.0–0.2)

## 2022-10-16 MED ORDER — ONDANSETRON HCL 4 MG PO TABS: 4.0000 mg | ORAL_TABLET | Freq: Four times a day (QID) | ORAL | 0 refills | Status: AC

## 2022-10-16 MED ORDER — KETOROLAC TROMETHAMINE 15 MG/ML IJ SOLN
15.0000 mg | Freq: Once | INTRAMUSCULAR | Status: AC
  Administered 2022-10-16: 15 mg via INTRAVENOUS
  Filled 2022-10-16: qty 1

## 2022-10-16 MED ORDER — ONDANSETRON HCL 4 MG/2ML IJ SOLN
4.0000 mg | Freq: Once | INTRAMUSCULAR | Status: AC
  Administered 2022-10-16: 4 mg via INTRAVENOUS
  Filled 2022-10-16: qty 2

## 2022-10-16 MED ORDER — TAMSULOSIN HCL 0.4 MG PO CAPS: 0.4000 mg | ORAL_CAPSULE | Freq: Every day | ORAL | 0 refills | Status: AC

## 2022-10-16 NOTE — ED Triage Notes (Signed)
Pt arrives to ED with c/o left sided flank pain, suprapubic pain, dysuria, and urinary urgency that started a couple of days ago but worsened this morning.

## 2022-10-16 NOTE — ED Notes (Signed)
Pt d/c home per MD order. Discharge summary reviewed, pt verbalizes understanding. No s/s of acute distress noted at discharge.  

## 2022-10-16 NOTE — ED Notes (Signed)
Patient transported to CT 

## 2022-10-16 NOTE — Discharge Instructions (Signed)
Follow-up with the urologist as discussed.  You do have a hernia in your abdominal wall.  Discussed with your primary care physician to monitor this.  Return to the emergency room if you have any worsening symptoms such as worsening pain, fevers, difficulty urinating or other worsening symptoms.

## 2022-10-16 NOTE — ED Provider Notes (Signed)
[facility] EMERGENCY DEPT Provider Note   CSN: [id number] Arrival date & time: 10/16/22  1761     History  Chief Complaint  Patient presents with   Flank Pain   Urinary Urgency    [name] [name]. is a 61 y.o. male.  Patient is a 61 year old male who presents with left-sided flank pain and dysuria.  He said he woke up this morning with pain in his left flank.  It starts in his mid abdomen and goes around to his back and his lower suprapubic area.  He feels like he has a lot of pressure in his suprapubic area and has urinary urgency but has not been producing much urine.  He says his pain waxes and wanes in intensity.  He had a history of a kidney stone 1 time in the past about 4 to 5 years ago which required stone retrieval.  He denies any known fevers.  No burning on urination.       Home Medications Prior to Admission medications   Medication Sig Start Date End Date Taking? Authorizing Provider  ondansetron (ZOFRAN) 4 MG tablet Take 1 tablet (4 mg total) by mouth every 6 (six) hours. 10/16/22  Yes Malvin Johns, MD  tamsulosin (FLOMAX) 0.4 MG CAPS capsule Take 1 capsule (0.4 mg total) by mouth daily. 10/16/22  Yes Malvin Johns, MD  amLODipine (NORVASC) 10 MG tablet Take 10 mg by mouth daily.  03/11/19   [provider]  betamethasone dipropionate 0.05 % cream Apply 1 application topically 2 (two) times daily as needed (rash). Patient not taking: Reported on 09/24/2022 01/04/20   [provider]  cyclobenzaprine (FLEXERIL) 10 MG tablet Take 0.5-1 tablets (5-10 mg total) by mouth 3 (three) times daily as needed for muscle spasms. 01/13/20   Judith Part, MD  escitalopram (LEXAPRO) 5 MG tablet Take half of a tablet (2.5 mg total dose) by mouth daily. Patient not taking: Reported on 09/24/2022 07/03/22   [name], MD  famotidine (PEPCID) 40 MG tablet Take 20 mg by mouth 2 (two) times daily.    [provider]   hydrochlorothiazide (HYDRODIURIL) 12.5 MG tablet Take 3.125 mg by mouth daily.  09/01/19   [provider]  ibuprofen (ADVIL) 200 MG tablet Take 600 mg by mouth every 6 (six) hours as needed for moderate pain.    [provider]  LORazepam (ATIVAN) 1 MG tablet Take 1 tablet (1 mg total) by mouth 2 (two) times daily as needed for anxiety. 09/30/22   Arfeen, Arlyce Harman, MD  Multiple Vitamin (MULTIVITAMIN WITH MINERALS) TABS Take 1 tablet by mouth daily.    [provider]  oxyCODONE-acetaminophen (PERCOCET) 10-325 MG tablet Take 1 tablet by mouth every 6 (six) hours as needed for pain.    [provider]  Probiotic Product (PROBIOTIC PO) Take 1 capsule by mouth daily.    [provider]  propranolol (INDERAL) 20 MG tablet 20 mg in the morning and at bedtime. 09/02/21   Koirala, Dibas, MD  psyllium (METAMUCIL) 58.6 % packet Take 1 packet by mouth every other day.    [provider]  sildenafil (VIAGRA) 100 MG tablet Take 100 mg by mouth daily as needed for erectile dysfunction.    [provider]  telmisartan (MICARDIS) 80 MG tablet Take 80 mg by mouth at bedtime.    [provider]  traZODone (DESYREL) 100 MG tablet Take 1 tablet (100 mg total) by mouth at bedtime. 09/30/22  Arfeen, Arlyce Harman, MD  simethicone (MYLICON) 80 MG chewable tablet Chew 80 mg by mouth every 6 (six) hours as needed for flatulence.  02/01/14  [provider]      Allergies    Chantix [varenicline], Morphine, and Penicillins    Review of Systems   Review of Systems  Constitutional:  Negative for chills, diaphoresis, fatigue and fever.  HENT:  Negative for congestion, rhinorrhea and sneezing.   Eyes: Negative.   Respiratory:  Negative for cough, chest tightness and shortness of breath.   Cardiovascular:  Negative for chest pain and leg swelling.  Gastrointestinal:  Positive for abdominal pain and nausea. Negative for blood in stool, diarrhea and  vomiting.  Genitourinary:  Positive for difficulty urinating, flank pain and urgency. Negative for frequency, hematuria, penile discharge, penile pain, penile swelling, scrotal swelling and testicular pain.  Musculoskeletal:  Negative for arthralgias and back pain.  Skin:  Negative for rash.  Neurological:  Negative for dizziness, speech difficulty, weakness, numbness and headaches.    Physical Exam Updated Vital Signs BP 132/80   Pulse 65   Temp (!) 97.5 F (36.4 C) (Oral)   Resp 16   Ht '5\' 11"'$  (1.803 m)   Wt 89.4 kg   SpO2 100%   BMI 27.48 kg/m  Physical Exam Constitutional:      Appearance: He is well-developed.  HENT:     Head: Normocephalic and atraumatic.  Eyes:     Pupils: Pupils are equal, round, and reactive to light.  Cardiovascular:     Rate and Rhythm: Normal rate and regular rhythm.     Heart sounds: Normal heart sounds.  Pulmonary:     Effort: Pulmonary effort is normal. No respiratory distress.     Breath sounds: Normal breath sounds. No wheezing or rales.  Chest:     Chest wall: No tenderness.  Abdominal:     General: Bowel sounds are normal.     Palpations: Abdomen is soft.     Tenderness: There is abdominal tenderness (Tenderness to the left flank). There is no guarding or rebound.     Comments: Scarring from prior surgery to the mid abdomen with thinning of the mid abdominal wall, no tenderness to this area.    Musculoskeletal:        General: Normal range of motion.     Cervical back: Normal range of motion and neck supple.  Lymphadenopathy:     Cervical: No cervical adenopathy.  Skin:    General: Skin is warm and dry.     Findings: No rash.  Neurological:     Mental Status: He is alert and oriented to person, place, and time.     ED Results / Procedures / Treatments   Labs (all labs ordered are listed, but only abnormal results are displayed) Labs Reviewed  URINALYSIS, ROUTINE W REFLEX MICROSCOPIC - Abnormal; Notable for the following  components:      Result Value   Hgb urine dipstick LARGE (*)    RBC / HPF >50 (*)    All other components within normal limits  URINE CULTURE  BASIC METABOLIC PANEL  CBC WITH DIFFERENTIAL/PLATELET    EKG None  Radiology CT Renal Stone Study  Result Date: 10/16/2022 CLINICAL DATA:  Flank/abdominal pain EXAM: CT ABDOMEN AND PELVIS WITHOUT CONTRAST TECHNIQUE: Multidetector CT imaging of the abdomen and pelvis was performed following the standard protocol without IV contrast. RADIATION DOSE REDUCTION: This exam was performed according to the departmental dose-optimization program which includes  automated exposure control, adjustment of the mA and/or kV according to patient size and/or use of iterative reconstruction technique. COMPARISON:  11/12/2019 FINDINGS: Lower chest: No acute abnormality. Hepatobiliary: No focal liver abnormality is seen. Status post cholecystectomy. No biliary dilatation. Pancreas: Unremarkable. No pancreatic ductal dilatation or surrounding inflammatory changes. Spleen: Normal in size without focal abnormality. Adrenals/Urinary Tract: Right ureteral stone and hydronephrosis present previously has resolved. There is now a left-sided hydronephrosis and hydroureter with a 4 mm stone in the distal left ureter just proximal to the UVJ. No adrenal lesions are identified. Unremarkable urinary bladder. Stomach/Bowel: Stomach is within normal limits. Status post right hemicolectomy. No dilated bowel identified to suggest obstruction. Vascular/Lymphatic: No significant vascular findings are present. No enlarged abdominal or pelvic lymph nodes. Reproductive: Enlarged prostate. Other: Unchanged anterior abdominal wall defect and diastasis recti with herniated loops of bowel. No incarceration or obstruction identified. Musculoskeletal: No acute or significant osseous findings. IMPRESSION: 1. Right ureteral stone has resolved and there is now a left ureteral 4 mm stone with obstruction. 2.  Large anterior abdominal wall defect containing herniated bowel without incarceration or obstruction. 3. Enlarged prostate. Electronically Signed   By: Sammie Bench M.D.   On: 10/16/2022 08:35    Procedures Procedures    Medications Ordered in ED Medications  ketorolac (TORADOL) 15 MG/ML injection 15 mg (15 mg Intravenous Given 10/16/22 0807)  ondansetron (ZOFRAN) injection 4 mg (4 mg Intravenous Given 10/16/22 0807)    ED Course/ Medical Decision Making/ A&P                           Medical Decision Making Amount and/or Complexity of Data Reviewed Labs: ordered. Radiology: ordered.  Risk Prescription drug management.   Patient is a 61 year old male who presents with left flank pain.  He was given some Toradol and his pain significantly improved.  His labs are reviewed and are nonconcerning.  His urinalysis shows hematuria but no evidence of infection.  No evidence of acute renal failure.  He had a CT scan which shows a 4 mm stone in the left distal ureter with some hydronephrosis.  His pain is well-controlled.  He does not have any suggestions of infection.  He does not meet criteria for inpatient treatment.  He was discharged home in good condition.  Discussed following up with a urologist.  He has been seen at [facility] urology in the past.  He declines any need for any opioid pain medication.  He will continue taking Tylenol or ibuprofen at home.  He says that the opioid pain medicine does not seem to help.  He was started on Flomax and given a prescription for Zofran.  Return precautions were given. He was noted to have hernia in his mid abdominal wall which is noted on exam to be scarred and thin from prior surgery.  He does not have any tenderness or evidence of acute obstruction, incarceration.  Discussed with the patient following up with his primary care physician to monitor this.   Final Clinical Impression(s) / ED Diagnoses Final diagnoses:  Kidney stone  Abdominal wall  hernia    Rx / DC Orders ED Discharge Orders          Ordered    tamsulosin (FLOMAX) 0.4 MG CAPS capsule  Daily        10/16/22 0918    ondansetron (ZOFRAN) 4 MG tablet  Every 6 hours        10/16/22  Clovis, Larcenia Holaday, MD 10/16/22 [phone]

## 2022-10-17 LAB — URINE CULTURE: Culture: NO GROWTH

## 2022-10-29 ENCOUNTER — Encounter: Payer: Self-pay | Admitting: Gastroenterology

## 2022-10-29 DIAGNOSIS — E538 Deficiency of other specified B group vitamins: Secondary | ICD-10-CM | POA: Diagnosis not present

## 2022-11-05 DIAGNOSIS — Z1331 Encounter for screening for depression: Secondary | ICD-10-CM | POA: Diagnosis not present

## 2022-11-05 DIAGNOSIS — E041 Nontoxic single thyroid nodule: Secondary | ICD-10-CM | POA: Diagnosis not present

## 2022-11-05 DIAGNOSIS — Z125 Encounter for screening for malignant neoplasm of prostate: Secondary | ICD-10-CM | POA: Diagnosis not present

## 2022-11-05 DIAGNOSIS — M545 Low back pain, unspecified: Secondary | ICD-10-CM | POA: Diagnosis not present

## 2022-11-05 DIAGNOSIS — Z79899 Other long term (current) drug therapy: Secondary | ICD-10-CM | POA: Diagnosis not present

## 2022-11-05 DIAGNOSIS — Z0001 Encounter for general adult medical examination with abnormal findings: Secondary | ICD-10-CM | POA: Diagnosis not present

## 2022-11-05 DIAGNOSIS — E559 Vitamin D deficiency, unspecified: Secondary | ICD-10-CM | POA: Diagnosis not present

## 2022-11-05 DIAGNOSIS — R7309 Other abnormal glucose: Secondary | ICD-10-CM | POA: Diagnosis not present

## 2022-11-05 DIAGNOSIS — E538 Deficiency of other specified B group vitamins: Secondary | ICD-10-CM | POA: Diagnosis not present

## 2022-11-05 DIAGNOSIS — E7800 Pure hypercholesterolemia, unspecified: Secondary | ICD-10-CM | POA: Diagnosis not present

## 2022-11-05 DIAGNOSIS — R7301 Impaired fasting glucose: Secondary | ICD-10-CM | POA: Diagnosis not present

## 2022-11-06 ENCOUNTER — Ambulatory Visit (AMBULATORY_SURGERY_CENTER): Payer: Medicare HMO | Admitting: Gastroenterology

## 2022-11-06 ENCOUNTER — Encounter: Payer: Self-pay | Admitting: Gastroenterology

## 2022-11-06 VITALS — BP 109/74 | HR 60 | Temp 98.2°F | Resp 16 | Ht 71.0 in | Wt 203.0 lb

## 2022-11-06 DIAGNOSIS — R197 Diarrhea, unspecified: Secondary | ICD-10-CM | POA: Diagnosis not present

## 2022-11-06 DIAGNOSIS — K529 Noninfective gastroenteritis and colitis, unspecified: Secondary | ICD-10-CM

## 2022-11-06 DIAGNOSIS — Z09 Encounter for follow-up examination after completed treatment for conditions other than malignant neoplasm: Secondary | ICD-10-CM

## 2022-11-06 DIAGNOSIS — F419 Anxiety disorder, unspecified: Secondary | ICD-10-CM | POA: Diagnosis not present

## 2022-11-06 DIAGNOSIS — J449 Chronic obstructive pulmonary disease, unspecified: Secondary | ICD-10-CM | POA: Diagnosis not present

## 2022-11-06 DIAGNOSIS — D128 Benign neoplasm of rectum: Secondary | ICD-10-CM

## 2022-11-06 DIAGNOSIS — Z86010 Personal history of colonic polyps: Secondary | ICD-10-CM

## 2022-11-06 DIAGNOSIS — I10 Essential (primary) hypertension: Secondary | ICD-10-CM | POA: Diagnosis not present

## 2022-11-06 MED ORDER — SODIUM CHLORIDE 0.9 % IV SOLN: 500.0000 mL | Freq: Once | INTRAVENOUS | Status: DC

## 2022-11-06 NOTE — Progress Notes (Signed)
Sedate, gd SR, tolerated procedure well, VSS, report to RN 

## 2022-11-06 NOTE — Op Note (Signed)
[facility] Patient Name: [name] [name] Procedure Date: 11/06/2022 9:19 AM MRN: [id number] Endoscopist: Ladene Artist , MD, [id number] Age: 62 Referring MD:  Date of Birth: December 10, 1960 Gender: Male Account #: [id number] Procedure:                Colonoscopy Indications:              Surveillance: Personal history of adenomatous                            polyps on last colonoscopy > 5 years ago, chronic                            diarrhea Medicines:                Monitored Anesthesia Care Procedure:                Pre-Anesthesia Assessment:                           - Prior to the procedure, a History and Physical                            was performed, and patient medications and                            allergies were reviewed. The patient's tolerance of                            previous anesthesia was also reviewed. The risks                            and benefits of the procedure and the sedation                            options and risks were discussed with the patient.                            All questions were answered, and informed consent                            was obtained. Prior Anticoagulants: The patient has                            taken no anticoagulant or antiplatelet agents. ASA                            Grade Assessment: II - A patient with mild systemic                            disease. After reviewing the risks and benefits,                            the patient was deemed in satisfactory condition to  undergo the procedure.                           After obtaining informed consent, the colonoscope                            was passed under direct vision. Throughout the                            procedure, the patient's blood pressure, pulse, and                            oxygen saturations were monitored continuously. The                            CF [id number] #[id number] was introduced through the anus                             and advanced to the the ileocolonic anastomosis.                            The terminal ileum and the rectum were                            photographed. The quality of the bowel preparation                            was excellent. The colonoscopy was performed                            without difficulty. The patient tolerated the                            procedure well. Scope In: 9:30:55 AM Scope Out: 9:42:36 AM Scope Withdrawal Time: 0 hours 9 minutes 4 seconds  Total Procedure Duration: 0 hours 11 minutes 41 seconds  Findings:                 The perianal and digital rectal examinations were                            normal.                           There was evidence of a prior end-to-side                            ileo-colonic anastomosis in the ascending colon.                            This was patent and was characterized by healthy                            appearing mucosa. The anastomosis was traversed.  The neo-terminal ileum appeared normal.                           A 7 mm polyp was found in the rectum. The polyp was                            sessile. The polyp was removed with a cold snare.                            Resection and retrieval were complete.                           Internal hemorrhoids were found during                            retroflexion. The hemorrhoids were small and Grade                            I (internal hemorrhoids that do not prolapse).                           The exam was otherwise without abnormality on                            direct and retroflexion views. Random biopsies                            obtained. Complications:            No immediate complications. Estimated blood loss:                            None. Estimated Blood Loss:     Estimated blood loss: none. Impression:               - Patent end-to-side ileo-colonic anastomosis,                             characterized by healthy appearing mucosa.                           - The examined portion of the ileum was normal.                           - One 7 mm polyp in the rectum, removed with a cold                            snare. Resected and retrieved.                           - Internal hemorrhoids.                           - The examination was otherwise normal on direct  and retroflexion views. Biopsied. Recommendation:           - Repeat colonoscopy after studies are complete for                            surveillance based on pathology results.                           - Patient has a contact number available for                            emergencies. The signs and symptoms of potential                            delayed complications were discussed with the                            patient. Return to normal activities tomorrow.                            Written discharge instructions were provided to the                            patient.                           - Resume previous diet.                           - Continue present medications.                           - Await pathology results. Ladene Artist, MD 11/06/2022 9:46:21 AM This report has been signed electronically.

## 2022-11-06 NOTE — Patient Instructions (Signed)
Please read handouts provided. Continue present medications. Await pathology results.   YOU HAD AN ENDOSCOPIC PROCEDURE TODAY AT THE [facility]:   Refer to the procedure report that was given to you for any specific questions about what was found during the examination.  If the procedure report does not answer your questions, please call your gastroenterologist to clarify.  If you requested that your care partner not be given the details of your procedure findings, then the procedure report has been included in a sealed envelope for you to review at your convenience later.  YOU SHOULD EXPECT: Some feelings of bloating in the abdomen. Passage of more gas than usual.  Walking can help get rid of the air that was put into your GI tract during the procedure and reduce the bloating. If you had a lower endoscopy (such as a colonoscopy or flexible sigmoidoscopy) you may notice spotting of blood in your stool or on the toilet paper. If you underwent a bowel prep for your procedure, you may not have a normal bowel movement for a few days.  Please Note:  You might notice some irritation and congestion in your nose or some drainage.  This is from the oxygen used during your procedure.  There is no need for concern and it should clear up in a day or so.  SYMPTOMS TO REPORT IMMEDIATELY:  Following lower endoscopy (colonoscopy or flexible sigmoidoscopy):  Excessive amounts of blood in the stool  Significant tenderness or worsening of abdominal pains  Swelling of the abdomen that is new, acute  Fever of 100F or higher  For urgent or emergent issues, a gastroenterologist can be reached at any hour by calling [phone]. Do not use MyChart messaging for urgent concerns.    DIET:  We do recommend a small meal at first, but then you may proceed to your regular diet.  Drink plenty of fluids but you should avoid alcoholic beverages for 24 hours.  ACTIVITY:  You should plan to take it easy for  the rest of today and you should NOT DRIVE or use heavy machinery until tomorrow (because of the sedation medicines used during the test).    FOLLOW UP: Our staff will call the number listed on your records the next business day following your procedure.  We will call around 7:15- 8:00 am to check on you and address any questions or concerns that you may have regarding the information given to you following your procedure. If we do not reach you, we will leave a message.     If any biopsies were taken you will be contacted by phone or by letter within the next 1-3 weeks.  Please call us at [phone] if you have not heard about the biopsies in 3 weeks.    SIGNATURES/CONFIDENTIALITY: You and/or your care partner have signed paperwork which will be entered into your electronic medical record.  These signatures attest to the fact that that the information above on your After Visit Summary has been reviewed and is understood.  Full responsibility of the confidentiality of this discharge information lies with you and/or your care-partner. 

## 2022-11-06 NOTE — Progress Notes (Signed)
History & Physical  Primary Care Physician:  Lujean Amel, MD Primary Gastroenterologist: Lucio Edward, MD  CHIEF COMPLAINT:  Diarrhea, Personal history of colon polyps   HPI: [name] Korte Sr. is a 62 y.o. male with diarrhea and personal history of adenomatous colon polyps for colonoscopy.   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   left   Erythrocytosis 11/30/2014   Essential hypertension    GERD (gastroesophageal reflux disease)    Headache(784.0)    2 x/week   History of kidney stones    Hypothyroidism    hx of    Kidney stone    Leukocytosis, unspecified 11/03/2013   Limited joint range of motion    cervical fusion 02/2013   Medial meniscus tear 06/2013   left knee   Pheochromocytoma, right, 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 06/27/2017   Throat and mouth symptom    itchy throat x 3 weeks   Thyroid nodule    Tobacco abuse    Wears partial dentures    upper    Past Surgical History:  Procedure Laterality Date   ABDOMINAL SURGERY     ANTERIOR CERVICAL DECOMP/DISCECTOMY FUSION N/A 03/18/2013   Procedure: ANTERIOR CERVICAL DECOMPRESSION/DISCECTOMY FUSION 3 LEVELS;  Surgeon: Erline Levine, MD;  Location: [facility];  Service: Neurosurgery;  Laterality: N/A;  Anterior Cervical Three-Six  Decompression/Diskectomy/Fusion   APPENDECTOMY     CIRCUMCISION  05/04/2006   COLONOSCOPY     CYSTOSCOPY/URETEROSCOPY/HOLMIUM LASER/STENT PLACEMENT Left 11/24/2018   Procedure: CYSTOSCOPY/URETEROSCOPY/HOLMIUM LASER/STENT PLACEMENT;  Surgeon: Lucas Mallow, MD;  Location: [facility];  Service: Urology;  Laterality: Left;   DECOMPRESSIVE LUMBAR LAMINECTOMY LEVEL 1 Right 01/11/2020   Procedure: RIGHT LUMBAR FOUR-FIVE MINIMALLY INVASIVE LUMBAR DISCECTOMY;  Surgeon: Judith Part, MD;  Location: [facility];  Service: Neurosurgery;  Laterality: Right;   EYE SURGERY      bilateral cataract surgery    HEMICOLECTOMY Right 2/64/1583   with ileocolic anastomosis   INGUINAL HERNIA REPAIR     INSERTION OF MESH N/A 07/31/2017   Procedure: INSERTION OF MESH;  Surgeon: Johnathan Hausen, MD;  Location: [facility];  Service: General;  Laterality: N/A;   KNEE ARTHROSCOPY Left 08/01/2013   Procedure: LEFT ARTHROSCOPY KNEE;  Surgeon: Kerin Salen, MD;  Location: [facility];  Service: Orthopedics;  Laterality: Left;   LAPAROSCOPIC ADRENALECTOMY Right 05/06/2017   Procedure: OPEN RIGHT ADRENALECTOMY WITH SMALL BOWEL RESECTION AND ANASTOMOSIS;  Surgeon: Kieth Brightly, Arta Bruce, MD;  Location: [facility];  Service: General;  Laterality: Right;   LAPAROSCOPIC LYSIS OF ADHESIONS N/A 07/31/2017   Procedure: LYSIS OF ADHESIONS;  Surgeon: Johnathan Hausen, MD;  Location: [facility];  Service: General;  Laterality: N/A;   LAPAROSCOPY N/A 07/31/2017   Procedure: LAPAROSCOPY DIAGNOSTIC;  Surgeon: Johnathan Hausen, MD;  Location: [facility];  Service: General;  Laterality: N/A;   LAPAROTOMY N/A 05/13/2017   Procedure: EXPLORATORY LAPAROTOMY, with wound dehisense, and repair of anastamotic leak;  Surgeon: Mickeal Skinner, MD;  Location: [facility];  Service: General;  Laterality: N/A;   LAPAROTOMY N/A 05/14/2017   Procedure: EXPLORATORY LAPAROTOMY, LYSIS OF ADHESIONS/ SMALL BOWEL RESECTION/ WASHOUT OF ABDOMEN AND PLACEMENT OF NEGATIVE PRESSURE DRESSING;  Surgeon: Kieth Brightly Arta Bruce, MD;  Location: [facility];  Service: General;  Laterality: N/A;   LAPAROTOMY N/A 05/17/2017   Procedure: EXPLORATORY LAPAROTOMY, PLACEMENT  OF ILEOSTOMY TUBES, PARTIAL CLOSURE OF FASCIA, WOUND VAC EXCHANGE;  Surgeon: [name], Arta Bruce, MD;  Location: [facility];  Service: General;  Laterality: N/A;   LAPAROTOMY N/A 05/20/2017   Procedure: ABDOMINAL WOUND [place] OUT, WOUND CLOSURE AND WOUND VAC PLACEMENT;  Surgeon: [name], Arta Bruce, MD;  Location: [facility];  Service: General;  Laterality: N/A;   LAPAROTOMY N/A 07/31/2017    Procedure: EXPLORATORY LAPAROTOMY WITH CLOSURE OF ENTEROTOMICS;  Surgeon: Johnathan Hausen, MD;  Location: [facility];  Service: General;  Laterality: N/A;   SHOULDER ARTHROSCOPY W/ ROTATOR CUFF REPAIR Left 07/16/2010   SMALL INTESTINE SURGERY  04/24/2000   adhesiolysis, ileocolic anastomosis   UPPER GASTROINTESTINAL ENDOSCOPY      Prior to Admission medications   Medication Sig Start Date End Date Taking? Authorizing Provider  amLODipine (NORVASC) 10 MG tablet Take 10 mg by mouth daily.  03/11/19  Yes [provider]  cyclobenzaprine (FLEXERIL) 10 MG tablet Take 0.5-1 tablets (5-10 mg total) by mouth 3 (three) times daily as needed for muscle spasms. 01/13/20  Yes Judith Part, MD  famotidine (PEPCID) 40 MG tablet Take 20 mg by mouth 2 (two) times daily.   Yes [provider]  LORazepam (ATIVAN) 1 MG tablet Take 1 tablet (1 mg total) by mouth 2 (two) times daily as needed for anxiety. 09/30/22  Yes Arfeen, Arlyce Harman, MD  Multiple Vitamin (MULTIVITAMIN WITH MINERALS) TABS Take 1 tablet by mouth daily.   Yes [provider]  oxyCODONE-acetaminophen (PERCOCET) 10-325 MG tablet Take 1 tablet by mouth every 6 (six) hours as needed for pain.   Yes [provider]  Probiotic Product (PROBIOTIC PO) Take 1 capsule by mouth daily.   Yes [provider]  propranolol (INDERAL) 20 MG tablet 20 mg in the morning and at bedtime. 09/02/21  Yes Koirala, Dibas, MD  sildenafil (VIAGRA) 100 MG tablet Take 100 mg by mouth daily as needed for erectile dysfunction.   Yes [provider]  telmisartan (MICARDIS) 80 MG tablet Take 80 mg by mouth at bedtime.   Yes [provider]  traZODone (DESYREL) 100 MG tablet Take 1 tablet (100 mg total) by mouth at bedtime. 09/30/22  Yes Arfeen, Arlyce Harman, MD  betamethasone dipropionate 0.05 % cream Apply 1 application topically 2 (two) times daily as needed (rash). Patient not taking: Reported on 09/24/2022 01/04/20   [provider]  escitalopram (LEXAPRO) 5 MG tablet Take half of a tablet (2.5 mg total dose) by mouth daily. Patient not taking: Reported on 09/24/2022 07/03/22   Arfeen, Arlyce Harman, MD  hydrochlorothiazide (HYDRODIURIL) 12.5 MG tablet Take 3.125 mg by mouth daily.  Patient not taking: Reported on 11/06/2022 09/01/19   [provider]  ibuprofen (ADVIL) 200 MG tablet Take 600 mg by mouth every 6 (six) hours as needed for moderate pain.    [provider]  ondansetron (ZOFRAN) 4 MG tablet Take 1 tablet (4 mg total) by mouth every 6 (six) hours. 10/16/22   Malvin Johns, MD  psyllium (METAMUCIL) 58.6 % packet Take 1 packet by mouth every other day.    [provider]  tamsulosin (FLOMAX) 0.4 MG CAPS capsule Take 1 capsule (0.4 mg total) by mouth daily. Patient not taking: Reported on 11/06/2022 10/16/22   Malvin Johns, MD  simethicone (MYLICON) 80 MG chewable tablet Chew 80 mg by mouth every 6 (six) hours as needed for flatulence.  02/01/14  [provider]    Current Outpatient Medications  Medication  Sig Dispense Refill   amLODipine (NORVASC) 10 MG tablet Take 10 mg by mouth daily.      cyclobenzaprine (FLEXERIL) 10 MG tablet Take 0.5-1 tablets (5-10 mg total) by mouth 3 (three) times daily as needed for muscle spasms. 20 tablet 0   famotidine (PEPCID) 40 MG tablet Take 20 mg by mouth 2 (two) times daily.     LORazepam (ATIVAN) 1 MG tablet Take 1 tablet (1 mg total) by mouth 2 (two) times daily as needed for anxiety. 60 tablet 1   Multiple Vitamin (MULTIVITAMIN WITH MINERALS) TABS Take 1 tablet by mouth daily.     oxyCODONE-acetaminophen (PERCOCET) 10-325 MG tablet Take 1 tablet by mouth every 6 (six) hours as needed for pain.     Probiotic Product (PROBIOTIC PO) Take 1 capsule by mouth daily.     propranolol (INDERAL) 20 MG tablet 20 mg in the morning and at bedtime.     sildenafil (VIAGRA) 100 MG tablet Take 100 mg by mouth daily as needed for erectile  dysfunction.     telmisartan (MICARDIS) 80 MG tablet Take 80 mg by mouth at bedtime.     traZODone (DESYREL) 100 MG tablet Take 1 tablet (100 mg total) by mouth at bedtime. 90 tablet 0   betamethasone dipropionate 0.05 % cream Apply 1 application topically 2 (two) times daily as needed (rash). (Patient not taking: Reported on 09/24/2022)     escitalopram (LEXAPRO) 5 MG tablet Take half of a tablet (2.5 mg total dose) by mouth daily. (Patient not taking: Reported on 09/24/2022) 30 tablet 0   hydrochlorothiazide (HYDRODIURIL) 12.5 MG tablet Take 3.125 mg by mouth daily.  (Patient not taking: Reported on 11/06/2022)     ibuprofen (ADVIL) 200 MG tablet Take 600 mg by mouth every 6 (six) hours as needed for moderate pain.     ondansetron (ZOFRAN) 4 MG tablet Take 1 tablet (4 mg total) by mouth every 6 (six) hours. 12 tablet 0   psyllium (METAMUCIL) 58.6 % packet Take 1 packet by mouth every other day.     tamsulosin (FLOMAX) 0.4 MG CAPS capsule Take 1 capsule (0.4 mg total) by mouth daily. (Patient not taking: Reported on 11/06/2022) 10 capsule 0   Current Facility-Administered Medications  Medication Dose Route Frequency Provider Last Rate Last Admin   0.9 %  sodium chloride infusion  500 mL Intravenous Once Ladene Artist, MD        Allergies as of 11/06/2022 - Review Complete 11/06/2022  Allergen Reaction Noted   Chantix [varenicline] Palpitations 08/25/2013   Morphine Itching and Other (See Comments) 07/24/2008   Penicillins Rash and Other (See Comments) 09/25/2010    Family History  Problem Relation Age of Onset   Colon cancer Father 63   Breast cancer Sister 91   Thyroid disease Neg Hx     Social History   Socioeconomic History   Marital status: Married    Spouse name: Not on file   Number of children: Not on file   Years of education: Not on file   Highest education level: Not on file  Occupational History   Not on file  Tobacco Use   Smoking status: Former    Packs/day:  1.00    Years: 25.00    Total pack years: 25.00    Types: Cigarettes    Quit date: 2017    Years since quitting: 7.0   Smokeless tobacco: Never   Tobacco comments:    quit smoking 3 years ago  Vaping Use   Vaping Use: Never used  Substance and Sexual Activity   Alcohol use: Yes    Comment: occasionally   Drug use: No   Sexual activity: Not on file  Other Topics Concern   Not on file  Social History Narrative   Lives in [place] with his wife.   Social Determinants of Health   Financial Resource Strain: Not on file  Food Insecurity: Not on file  Transportation Needs: Not on file  Physical Activity: Not on file  Stress: Not on file  Social Connections: Not on file  Intimate Partner Violence: Not on file    Review of Systems:  All systems reviewed were negative except where noted in HPI.   Physical Exam: General:  Alert, well-developed, in NAD Head:  Normocephalic and atraumatic. Eyes:  Sclera clear, no icterus.   Conjunctiva pink. Ears:  Normal auditory acuity. Mouth:  No deformity or lesions.  Neck:  Supple; no masses . Lungs:  Clear throughout to auscultation.   No wheezes, crackles, or rhonchi. No acute distress. Heart:  Regular rate and rhythm; no murmurs. Abdomen:  Soft, nondistended, nontender. No masses, hepatomegaly. No obvious masses.  Normal bowel .    Rectal:  Deferred   Msk:  Symmetrical without gross deformities.. Pulses:  Normal pulses noted. Extremities:  Without edema. Neurologic:  Alert and  oriented x4;  grossly normal neurologically. Skin:  Intact without significant lesions or rashes. Psych:  Alert and cooperative. Normal mood and affect.  Impression / Plan:   Diarrhea and personal history of adenomatous colon polyps for colonoscopy.  Pricilla Riffle. Fuller Plan  11/06/2022, 9:22 AM See Shea Evans, [facility], to contact our on call provider

## 2022-11-06 NOTE — Progress Notes (Signed)
Called to room to assist during endoscopic procedure.  Patient ID and intended procedure confirmed with present staff. Received instructions for my participation in the procedure from the performing physician.  

## 2022-11-07 ENCOUNTER — Telehealth: Payer: Self-pay

## 2022-11-07 NOTE — Telephone Encounter (Signed)
  Follow up Call-     11/06/2022    8:56 AM  Call back number  Post procedure Call Back phone  # [phone]  Permission to leave phone message Yes     Patient questions:  Do you have a fever, pain , or abdominal swelling? No. Pain Score  0 *  Have you tolerated food without any problems? Yes.    Have you been able to return to your normal activities? Yes.    Do you have any questions about your discharge instructions: Diet   No. Medications  No. Follow up visit  No.  Do you have questions or concerns about your Care? No.  Actions: * If pain score is 4 or above: No action needed, pain <4.

## 2022-11-13 DIAGNOSIS — F1120 Opioid dependence, uncomplicated: Secondary | ICD-10-CM | POA: Diagnosis not present

## 2022-11-13 DIAGNOSIS — G894 Chronic pain syndrome: Secondary | ICD-10-CM | POA: Diagnosis not present

## 2022-11-13 DIAGNOSIS — M5416 Radiculopathy, lumbar region: Secondary | ICD-10-CM | POA: Diagnosis not present

## 2022-11-13 DIAGNOSIS — M542 Cervicalgia: Secondary | ICD-10-CM | POA: Diagnosis not present

## 2022-11-20 ENCOUNTER — Encounter: Payer: Self-pay | Admitting: Gastroenterology

## 2022-11-27 ENCOUNTER — Telehealth (HOSPITAL_COMMUNITY): Payer: Self-pay | Admitting: *Deleted

## 2022-11-27 ENCOUNTER — Other Ambulatory Visit (HOSPITAL_COMMUNITY): Payer: Self-pay | Admitting: *Deleted

## 2022-11-27 DIAGNOSIS — F410 Panic disorder [episodic paroxysmal anxiety] without agoraphobia: Secondary | ICD-10-CM

## 2022-11-27 MED ORDER — LORAZEPAM 1 MG PO TABS: 1.0000 mg | ORAL_TABLET | Freq: Two times a day (BID) | ORAL | 0 refills | Status: DC | PRN

## 2022-11-27 NOTE — Telephone Encounter (Signed)
Yes okay to send to pick up on his due date.

## 2022-11-27 NOTE — Telephone Encounter (Signed)
Done

## 2022-11-27 NOTE — Telephone Encounter (Signed)
Writer spoke with pt who is requesting a second opinion from Neurology. Pt was contacted for an appointment with Dr. Krista Blue @ [facility] however he has already seen this provider and wants a second opinion from another Neurologist. Referral will be sent to [facility] again. Pt also requesting refill of Lorazepam 1 mg BID PRN. Last sent 09/30/22 with one fill. No early refills. Ok to send with start date of 12/01/22? Pt next appointment scheduled for 12/31/22. Please review and advise.

## 2022-12-01 DIAGNOSIS — E538 Deficiency of other specified B group vitamins: Secondary | ICD-10-CM | POA: Diagnosis not present

## 2022-12-02 DIAGNOSIS — H524 Presbyopia: Secondary | ICD-10-CM | POA: Diagnosis not present

## 2022-12-03 ENCOUNTER — Telehealth (HOSPITAL_COMMUNITY): Payer: Self-pay | Admitting: *Deleted

## 2022-12-03 NOTE — Telephone Encounter (Signed)
Writer spoke with pt to advise that referral for a Neurology second opinion has been denied by [facility] by Dr. Carles Collet. This due to pt having previously been seen at [facility] and that he hasn't actually had an appointment there since 06/19/21. Pt encouraged to make an appointment with [facility]. Pt verbalizes understanding.

## 2022-12-10 DIAGNOSIS — S9002XA Contusion of left ankle, initial encounter: Secondary | ICD-10-CM | POA: Diagnosis not present

## 2022-12-29 DIAGNOSIS — E538 Deficiency of other specified B group vitamins: Secondary | ICD-10-CM | POA: Diagnosis not present

## 2022-12-30 ENCOUNTER — Telehealth (HOSPITAL_COMMUNITY): Payer: Medicare HMO | Admitting: Psychiatry

## 2022-12-30 DIAGNOSIS — E559 Vitamin D deficiency, unspecified: Secondary | ICD-10-CM | POA: Diagnosis not present

## 2022-12-31 ENCOUNTER — Encounter (HOSPITAL_COMMUNITY): Payer: Self-pay | Admitting: Psychiatry

## 2022-12-31 ENCOUNTER — Telehealth (HOSPITAL_BASED_OUTPATIENT_CLINIC_OR_DEPARTMENT_OTHER): Payer: Medicare HMO | Admitting: Psychiatry

## 2022-12-31 VITALS — Wt 196.0 lb

## 2022-12-31 DIAGNOSIS — F411 Generalized anxiety disorder: Secondary | ICD-10-CM | POA: Diagnosis not present

## 2022-12-31 DIAGNOSIS — R251 Tremor, unspecified: Secondary | ICD-10-CM | POA: Diagnosis not present

## 2022-12-31 DIAGNOSIS — F410 Panic disorder [episodic paroxysmal anxiety] without agoraphobia: Secondary | ICD-10-CM | POA: Diagnosis not present

## 2022-12-31 MED ORDER — TRAZODONE HCL 50 MG PO TABS: 50.0000 mg | ORAL_TABLET | Freq: Every day | ORAL | 1 refills | Status: DC

## 2022-12-31 MED ORDER — LORAZEPAM 1 MG PO TABS: 1.0000 mg | ORAL_TABLET | Freq: Two times a day (BID) | ORAL | 1 refills | Status: DC | PRN

## 2022-12-31 NOTE — Progress Notes (Signed)
[facility] MD Virtual Progress Note   Patient Location: Home Provider Location: Home Office  I connect with patient by telephone and verified that I am speaking with correct person by using two identifiers. I discussed the limitations of evaluation and management by telemedicine and the availability of in person appointments. I also discussed with the patient that there may be a patient responsible charge related to this service. The patient expressed understanding and agreed to proceed.  [name] Scalzitti Sr. [id number] 62 y.o.  12/31/2022 10:02 AM    History of Present Illness:  Patient is evaluated by phone session.  On the last visit he decided to cut down the trazodone.  He was taking 100 mg and slowly and gradually he stopped taking the trazodone since last week but has not seen significant improvement in his tremors.  He noticed less brain fog but noticed having headaches, increased anxiety and some night struggle with sleep.  His appointment with the neurology to address his chronic neurological issues including tremors, tingling, brain fog and now headaches.  He also started propranolol but after taking 20 mg he feels very dizzy and is stopped.  He like to go back on lower dose 10 mg to see how it affects his anxiety.  He also endorses few family deaths recently in the family and like to reestablish care with therapist.  Denies any hallucination, paranoia or suicidal thoughts but continued to have panic attacks, generalized anxiety and nervousness.  He is taking Ativan 1 mg twice a day as needed.  He also getting B12 injections.  He denies any agitation or any anger.  He is trying to lose weight and lost few pounds since the last visit.  He had a visit in the emergency room for acute flank pain.  Labs reviewed.  Labs are stable.  His appetite is okay and his energy level is okay.    Past psychiatric history; H/O anxiety since early 54s.  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  No h/o mania, psychosis, suicidal attempt, inpatient treatment.  Seen and managed by PCP since early 78s. Tried Remeron, hydroxyzine, gabapentin, Paxil, Prozac, Zoloft, Cymbalta and Lamictal.  Trintellix helped but in the emergency room it was discontinued as patient complaining of tremors.  Pristiq could not tolerate more than 25 mg.   Gene-Sight shows Cymbalta, mirtazapine, Paxil, Luvox, clozapine, olanzapine, thiothixene has significant drug interaction.   Outpatient Encounter Medications as of 12/31/2022  Medication Sig   amLODipine (NORVASC) 10 MG tablet Take 10 mg by mouth daily.    betamethasone dipropionate 0.05 % cream Apply 1 application topically 2 (two) times daily as needed (rash). (Patient not taking: Reported on 09/24/2022)   cyclobenzaprine (FLEXERIL) 10 MG tablet Take 0.5-1 tablets (5-10 mg total) by mouth 3 (three) times daily as needed for muscle spasms.   escitalopram (LEXAPRO) 5 MG tablet Take half of a tablet (2.5 mg total dose) by mouth daily. (Patient not taking: Reported on 09/24/2022)   famotidine (PEPCID) 40 MG tablet Take 20 mg by mouth 2 (two) times daily.   hydrochlorothiazide (HYDRODIURIL) 12.5 MG tablet Take 3.125 mg by mouth daily.  (Patient not taking: Reported on 11/06/2022)   ibuprofen (ADVIL) 200 MG tablet Take 600 mg by mouth every 6 (six) hours as needed for moderate pain.   LORazepam (ATIVAN) 1 MG tablet Take 1 tablet (1 mg total) by mouth 2 (two) times daily as needed for anxiety.   Multiple  Vitamin (MULTIVITAMIN WITH MINERALS) TABS Take 1 tablet by mouth daily.   ondansetron (ZOFRAN) 4 MG tablet Take 1 tablet (4 mg total) by mouth every 6 (six) hours.   oxyCODONE-acetaminophen (PERCOCET) 10-325 MG tablet Take 1 tablet by mouth every 6 (six) hours as needed for pain.   Probiotic Product (PROBIOTIC PO) Take 1 capsule by mouth daily.   propranolol (INDERAL) 20 MG tablet 20 mg in the morning and at bedtime.   psyllium (METAMUCIL) 58.6 %  packet Take 1 packet by mouth every other day.   sildenafil (VIAGRA) 100 MG tablet Take 100 mg by mouth daily as needed for erectile dysfunction.   tamsulosin (FLOMAX) 0.4 MG CAPS capsule Take 1 capsule (0.4 mg total) by mouth daily. (Patient not taking: Reported on 11/06/2022)   telmisartan (MICARDIS) 80 MG tablet Take 80 mg by mouth at bedtime.   traZODone (DESYREL) 100 MG tablet Take 1 tablet (100 mg total) by mouth at bedtime.   [DISCONTINUED] simethicone (MYLICON) 80 MG chewable tablet Chew 80 mg by mouth every 6 (six) hours as needed for flatulence.   No facility-administered encounter medications on file as of 12/31/2022.    Recent Results (from the past 2160 hour(s))  Urinalysis, Routine w reflex microscopic     Status: Abnormal   Collection Time: 10/16/22  8:02 AM  Result Value Ref Range   Color, Urine YELLOW YELLOW   APPearance CLEAR CLEAR   Specific Gravity, Urine 1.015 1.005 - 1.030   pH 5.5 5.0 - 8.0   Glucose, UA NEGATIVE NEGATIVE mg/dL   Hgb urine dipstick LARGE (A) NEGATIVE   Bilirubin Urine NEGATIVE NEGATIVE   Ketones, ur NEGATIVE NEGATIVE mg/dL   Protein, ur NEGATIVE NEGATIVE mg/dL   Nitrite NEGATIVE NEGATIVE   Leukocytes,Ua NEGATIVE NEGATIVE   RBC / HPF >50 (H) 0 - 5 RBC/hpf   WBC, UA 0-5 0 - 5 WBC/hpf   Bacteria, UA NONE SEEN NONE SEEN   Squamous Epithelial / HPF 0-5 0 - 5   Mucus PRESENT    Budding Yeast PRESENT     Comment: Performed at [facility], [address] [place], [place] [address]  Urine Culture     Status: None   Collection Time: 10/16/22  8:02 AM   Specimen: Urine, Clean Catch  Result Value Ref Range   Specimen Description      URINE, CLEAN CATCH Performed at [facility], [address] [place], [place] [address]    Special Requests      NONE Performed at [facility], [address], [place], [place] [address]    Culture      NO GROWTH Performed at [facility],  [place] [address], [place], [place] [address]    Report Status 10/17/2022 FINAL   Basic metabolic panel     Status: None   Collection Time: 10/16/22  8:02 AM  Result Value Ref Range   Sodium 142 135 - 145 mmol/L   Potassium 3.7 3.5 - 5.1 mmol/L   Chloride 107 98 - 111 mmol/L   CO2 23 22 - 32 mmol/L   Glucose, Bld 97 70 - 99 mg/dL    Comment: Glucose reference range applies only to samples taken after fasting for at least 8 hours.   BUN 8 8 - 23 mg/dL   Creatinine, Ser 0.89 0.61 - 1.24 mg/dL   Calcium 9.2 8.9 - 10.3 mg/dL   GFR, Estimated >60 >60 mL/min    Comment: (NOTE) Calculated using  the CKD-EPI Creatinine Equation (2021)    Anion gap 12 5 - 15    Comment: Performed at [facility], [address] [place], [place] [address]  CBC with Differential     Status: None   Collection Time: 10/16/22  8:02 AM  Result Value Ref Range   WBC 7.1 4.0 - 10.5 K/uL   RBC 4.85 4.22 - 5.81 MIL/uL   Hemoglobin 13.6 13.0 - 17.0 g/dL   HCT 41.9 39.0 - 52.0 %   MCV 86.4 80.0 - 100.0 fL   MCH 28.0 26.0 - 34.0 pg   MCHC 32.5 30.0 - 36.0 g/dL   RDW 14.7 11.5 - 15.5 %   Platelets 263 150 - 400 K/uL   nRBC 0.0 0.0 - 0.2 %   Neutrophils Relative % 43 %   Neutro Abs 3.1 1.7 - 7.7 K/uL   Lymphocytes Relative 46 %   Lymphs Abs 3.2 0.7 - 4.0 K/uL   Monocytes Relative 9 %   Monocytes Absolute 0.6 0.1 - 1.0 K/uL   Eosinophils Relative 2 %   Eosinophils Absolute 0.1 0.0 - 0.5 K/uL   Basophils Relative 0 %   Basophils Absolute 0.0 0.0 - 0.1 K/uL   Immature Granulocytes 0 %   Abs Immature Granulocytes 0.01 0.00 - 0.07 K/uL    Comment: Performed at [facility], [address] [place], [place] [address]     Psychiatric Specialty Exam: Physical Exam  Review of Systems  Neurological:  Positive for tremors and headaches.       Tingling    Weight 196 lb (88.9 kg).There is no height or weight on file to calculate BMI.  General Appearance: NA  Eye Contact:  NA   Speech:  Slow  Volume:  Normal  Mood:  Anxious and Dysphoric  Affect:  NA  Thought Process:  Goal Directed  Orientation:  Full (Time, Place, and Person)  Thought Content:  Rumination  Suicidal Thoughts:  No  Homicidal Thoughts:  No  Memory:  Immediate;   Good Recent;   Good Remote;   Good  Judgement:  Intact  Insight:  Present  Psychomotor Activity:  Tremor  Concentration:  Concentration: Fair and Attention Span: Fair  Recall:  Good  Fund of Knowledge:  Good  Language:  Good  Akathisia:  No  Handed:  Right  AIMS (if indicated):     Assets:  Communication Skills Desire for Improvement Housing Transportation  ADL's:  Intact  Cognition:  WNL  Sleep:  4-5 hrs     Assessment/Plan: Generalized anxiety disorder - Plan: traZODone (DESYREL) 50 MG tablet  Panic attack - Plan: LORazepam (ATIVAN) 1 MG tablet  Tremor  Review blood work results and recent visit to the emergency room for flank pain.  Discuss his psychosocial stressors as recent death in the family and his chronic symptoms.  Encouraged to keep appointment with neurology tomorrow.  Patient like to keep the trazodone but only 50 mg since he noticed reducing the dose did not help the tremors and cause increase anxiety.  Continue Ativan 1 mg 2 times a day.  He like to restart propranolol but only 10 mg prescribed by PCP to help with anxiety.  I spent a lot of time about his expectations of medication and previous trial.  I recommend considering therapy again to address his chronic symptoms of anxiety and panic attack.  Patient agreed and we will refer him to see a therapist in our office.  Patient like to  have a follow-up in 6 weeks.  Discussed medication side effects and benefits and recommend to call us back if he feels worsening of the symptoms.  Follow up in 6 weeks.   Follow Up Instructions:     I discussed the assessment and treatment plan with the patient. The patient was provided an opportunity to ask questions and  all were answered. The patient agreed with the plan and demonstrated an understanding of the instructions.   The patient was advised to call back or seek an in-person evaluation if the symptoms worsen or if the condition fails to improve as anticipated.    Collaboration of Care: Other provider involved in patient's care AEB notes are available in epic to review.  Patient/Guardian was advised Release of Information must be obtained prior to any record release in order to collaborate their care with an outside provider. Patient/Guardian was advised if they have not already done so to contact the registration department to sign all necessary forms in order for [name] to release information regarding their care.   Consent: Patient/Guardian gives verbal consent for treatment and assignment of benefits for services provided during this visit. Patient/Guardian expressed understanding and agreed to proceed.     I provided 24 minutes of non face to face time during this encounter.  [name], MD 12/31/2022

## 2023-01-01 ENCOUNTER — Ambulatory Visit: Payer: Medicare HMO | Admitting: Neurology

## 2023-01-01 ENCOUNTER — Encounter: Payer: Self-pay | Admitting: Neurology

## 2023-01-01 VITALS — BP 143/94 | HR 78 | Ht 71.0 in | Wt 196.0 lb

## 2023-01-01 DIAGNOSIS — M542 Cervicalgia: Secondary | ICD-10-CM | POA: Diagnosis not present

## 2023-01-01 DIAGNOSIS — F419 Anxiety disorder, unspecified: Secondary | ICD-10-CM

## 2023-01-01 DIAGNOSIS — R0683 Snoring: Secondary | ICD-10-CM | POA: Diagnosis not present

## 2023-01-01 DIAGNOSIS — G894 Chronic pain syndrome: Secondary | ICD-10-CM | POA: Insufficient documentation

## 2023-01-01 DIAGNOSIS — G4452 New daily persistent headache (NDPH): Secondary | ICD-10-CM | POA: Insufficient documentation

## 2023-01-01 MED ORDER — SUMATRIPTAN SUCCINATE 50 MG PO TABS: 50.0000 mg | ORAL_TABLET | ORAL | 6 refills | Status: AC | PRN

## 2023-01-01 NOTE — Progress Notes (Signed)
Chief Complaint  Patient presents with   New Patient (Initial Visit)    Rm 17, alone Tremors in head, hands and feet along with numbness in tingling, burning in feet, c/o daily headaches       ASSESSMENT AND PLAN  [name] Dechene Sr. is a 62 y.o. male    Constellation of complaints, in the setting of poorly controlled anxiety Frequent headache, neck pain, radiating pain to left shoulder History of cervical decompression surgery, Under pain management for chronic neck, low back pain Excessive daytime fatigue, snoring, narrow oropharyngeal space, at risk for obstructive sleep apnea  MRI of cervical spine to rule out recurrent left cervical radiculopathy  ESR C-reactive protein to rule out temporal arteritis  Referral to sleep study for risk of obstructive sleep apnea  Imitrex as needed   DIAGNOSTIC DATA (LABS, IMAGING, TESTING) - I reviewed patient records, labs, notes, testing and imaging myself where available.   MEDICAL HISTORY:   [name] Kuehnle. is a 62 year old male, seen in request by his psychiatrist Dr. Berniece Andreas for evaluation of constellation of complaints, body tremor, worsening anxiety, he is accompanied by his primary care physician Dr. Dorthy Cooler, [place], initial evaluation was on June 19, 2021 with his wife     I reviewed and summarized the referring note. PMHx. HTN Depression anxiety, since 2021,  only taking ativan 0.'5mg'$  tid prn. Cervical decompression surgery,  Adrenal gland nodule,  Intestine surgery in 2018 Pain management for chronic neck and low back pain  He reported history of depression since 2011, went on disability since then, also due to his previous cervical decompression surgery for cervical radiculopathy,  His depression has worsened since abdominal surgery in 2018, initially for adrenal gland surgery, complicated by intestinal injury per patient, required multiple abdominal surgery later  He was diagnosed with depression anxiety,  over the years, he was treated with different medications, most recent one was Trintellix, around April 2022, he noticed intermittent body tremor, worsening anxiety, could not keep himself calm,  Trintellix was stopped, now he is taking Ativan 0.5 mg 3-4 times daily even more each day, is no longer helpful, he described difficulty keeping calm,   UPDATE January 01 2023: He is alone at today's clinical visit, continues to complains of depression anxiety, constellation of complaints, not sleeping well, feeling anxious,  History of neck surgery for left cervical radiculopathy in 2009, he fell off a truck, landed on his left shoulder, since then, he has frequent headache  Increased frequency of headache, starting from the base of the scalp, spreading forward, sometimes throbbing, lasting for hours to days, denies light noise sensitivity, denies nausea,  He has poor sleep quality, often complains of daytime sleepiness, fatigue  Personally reviewed MRI of the brain September 2022, mild small vessel disease no acute abnormality  Laboratory evaluation from November 05, 2022, A1c 5.1, LDL 78 vitamin D 19, normal TSH 0.86, normal CMP creatinine 0.81 CBC hemoglobin of 13.1, B12 359, within normal range PSA,  PHYSICAL EXAM:   Vitals:   01/01/23 1012  BP: (!) 143/94  Pulse: 78  Weight: 196 lb (88.9 kg)  Height: '5\' 11"'$  (1.803 m)   Body mass index is 27.34 kg/m.  PHYSICAL EXAMNIATION:  Gen: NAD, conversant, well nourised, well groomed                     Cardiovascular: Regular rate rhythm, no peripheral edema, warm, nontender. Eyes: Conjunctivae clear without exudates or hemorrhage Neck: Supple, no  carotid bruits. Pulmonary: Clear to auscultation bilaterally   NEUROLOGICAL EXAM:  MENTAL STATUS: Speech/cognition: Depressed looking middle-age male, awake, alert, oriented to history taking and casual conversation CRANIAL NERVES: CN II: Visual fields are full to confrontation. Pupils are round  equal and briskly reactive to light. CN III, IV, VI: extraocular movement are normal. No ptosis. CN V: Facial sensation is intact to light touch CN VII: Face is symmetric with normal eye closure  CN VIII: Hearing is normal to causal conversation. CN IX, X: Phonation is normal. CN XI: Head turning and shoulder shrug are intact CN XII: Narrowing pharyngeal space  MOTOR: There is no pronator drift of out-stretched arms. Muscle bulk and tone are normal. Muscle strength is normal.  REFLEXES: Reflexes are 2+ and symmetric at the biceps, triceps, knees, and ankles. Plantar responses are flexor.  SENSORY: Intact to light touch, pinprick and vibratory sensation are intact in fingers and toes.  COORDINATION: There is no trunk or limb dysmetria noted.  GAIT/STANCE: Posture is normal. Gait is steady with normal steps, base, arm swing, and turning.   REVIEW OF SYSTEMS:  Full 14 system review of systems performed and notable only for as above All other review of systems were negative.   ALLERGIES: Allergies  Allergen Reactions   Chantix [Varenicline] Palpitations    Heart race Increased panic attacks   Morphine Itching and Other (See Comments)   Penicillins Rash and Other (See Comments)    Has patient had a PCN reaction causing immediate rash, facial/tongue/throat swelling, SOB or lightheadedness with hypotension: Yes  Has patient had a PCN reaction causing severe rash involving mucus membranes or skin necrosis: Unknown  Has patient had a PCN reaction that required hospitalization: No  Has patient had a PCN reaction occurring within the last 10 years: No  If all of the above answers are "NO", then may proceed with Cephalosporin use.  Tolerating Zosyn 05/2017    HOME MEDICATIONS: Current Outpatient Medications  Medication Sig Dispense Refill   amLODipine (NORVASC) 10 MG tablet Take 10 mg by mouth daily.      betamethasone dipropionate 0.05 % cream Apply 1 application topically 2  (two) times daily as needed (rash). (Patient not taking: Reported on 09/24/2022)     cyclobenzaprine (FLEXERIL) 10 MG tablet Take 0.5-1 tablets (5-10 mg total) by mouth 3 (three) times daily as needed for muscle spasms. 20 tablet 0   famotidine (PEPCID) 40 MG tablet Take 20 mg by mouth 2 (two) times daily.     hydrochlorothiazide (HYDRODIURIL) 12.5 MG tablet Take 3.125 mg by mouth daily.  (Patient not taking: Reported on 11/06/2022)     ibuprofen (ADVIL) 200 MG tablet Take 600 mg by mouth every 6 (six) hours as needed for moderate pain.     LORazepam (ATIVAN) 1 MG tablet Take 1 tablet (1 mg total) by mouth 2 (two) times daily as needed for anxiety. 60 tablet 1   Multiple Vitamin (MULTIVITAMIN WITH MINERALS) TABS Take 1 tablet by mouth daily.     ondansetron (ZOFRAN) 4 MG tablet Take 1 tablet (4 mg total) by mouth every 6 (six) hours. 12 tablet 0   oxyCODONE-acetaminophen (PERCOCET) 10-325 MG tablet Take 1 tablet by mouth every 6 (six) hours as needed for pain.     Probiotic Product (PROBIOTIC PO) Take 1 capsule by mouth daily.     propranolol (INDERAL) 20 MG tablet 20 mg in the morning and at bedtime.     psyllium (METAMUCIL) 58.6 % packet Take 1  packet by mouth every other day.     sildenafil (VIAGRA) 100 MG tablet Take 100 mg by mouth daily as needed for erectile dysfunction.     tamsulosin (FLOMAX) 0.4 MG CAPS capsule Take 1 capsule (0.4 mg total) by mouth daily. (Patient not taking: Reported on 11/06/2022) 10 capsule 0   telmisartan (MICARDIS) 80 MG tablet Take 80 mg by mouth at bedtime.     traZODone (DESYREL) 50 MG tablet Take 1 tablet (50 mg total) by mouth at bedtime. 30 tablet 1   No current facility-administered medications for this visit.    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   left   Erythrocytosis 11/30/2014   Essential hypertension    GERD  (gastroesophageal reflux disease)    Headache(784.0)    2 x/week   History of kidney stones    Hypothyroidism    hx of    Kidney stone    Leukocytosis, unspecified 11/03/2013   Limited joint range of motion    cervical fusion 02/2013   Medial meniscus tear 06/2013   left knee   Pheochromocytoma, right, 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 06/27/2017   Throat and mouth symptom    itchy throat x 3 weeks   Thyroid nodule    Tobacco abuse    Wears partial dentures    upper    PAST SURGICAL HISTORY: Past Surgical History:  Procedure Laterality Date   ABDOMINAL SURGERY     ANTERIOR CERVICAL DECOMP/DISCECTOMY FUSION N/A 03/18/2013   Procedure: ANTERIOR CERVICAL DECOMPRESSION/DISCECTOMY FUSION 3 LEVELS;  Surgeon: Erline Levine, MD;  Location: [facility];  Service: Neurosurgery;  Laterality: N/A;  Anterior Cervical Three-Six  Decompression/Diskectomy/Fusion   APPENDECTOMY     CIRCUMCISION  05/04/2006   COLONOSCOPY     CYSTOSCOPY/URETEROSCOPY/HOLMIUM LASER/STENT PLACEMENT Left 11/24/2018   Procedure: CYSTOSCOPY/URETEROSCOPY/HOLMIUM LASER/STENT PLACEMENT;  Surgeon: Lucas Mallow, MD;  Location: [facility];  Service: Urology;  Laterality: Left;   DECOMPRESSIVE LUMBAR LAMINECTOMY LEVEL 1 Right 01/11/2020   Procedure: RIGHT LUMBAR FOUR-FIVE MINIMALLY INVASIVE LUMBAR DISCECTOMY;  Surgeon: Judith Part, MD;  Location: [facility];  Service: Neurosurgery;  Laterality: Right;   EYE SURGERY     bilateral cataract surgery    HEMICOLECTOMY Right [id number]   with ileocolic anastomosis   INGUINAL HERNIA REPAIR     INSERTION OF MESH N/A 07/31/2017   Procedure: INSERTION OF MESH;  Surgeon: Johnathan Hausen, MD;  Location: [facility];  Service: General;  Laterality: N/A;   KNEE ARTHROSCOPY Left 08/01/2013   Procedure: LEFT ARTHROSCOPY KNEE;  Surgeon: Kerin Salen, MD;  Location: [facility];  Service: Orthopedics;  Laterality: Left;   LAPAROSCOPIC ADRENALECTOMY Right  05/06/2017   Procedure: OPEN RIGHT ADRENALECTOMY WITH SMALL BOWEL RESECTION AND ANASTOMOSIS;  Surgeon: Kieth Brightly, Arta Bruce, MD;  Location: [facility];  Service: General;  Laterality: Right;   LAPAROSCOPIC LYSIS OF ADHESIONS N/A 07/31/2017   Procedure: LYSIS OF ADHESIONS;  Surgeon: Johnathan Hausen, MD;  Location: [facility];  Service: General;  Laterality: N/A;   LAPAROSCOPY N/A 07/31/2017   Procedure: LAPAROSCOPY DIAGNOSTIC;  Surgeon: Johnathan Hausen, MD;  Location: [facility];  Service: General;  Laterality: N/A;   LAPAROTOMY N/A 05/13/2017   Procedure: EXPLORATORY LAPAROTOMY, with wound dehisense, and repair of anastamotic leak;  Surgeon: Mickeal Skinner, MD;  Location: [facility];  Service: General;  Laterality: N/A;   LAPAROTOMY N/A 05/14/2017   Procedure: EXPLORATORY LAPAROTOMY, LYSIS OF ADHESIONS/ SMALL BOWEL RESECTION/ WASHOUT OF ABDOMEN AND PLACEMENT OF NEGATIVE PRESSURE DRESSING;  Surgeon: [name], Arta Bruce, MD;  Location: [facility];  Service: General;  Laterality: N/A;   LAPAROTOMY N/A 05/17/2017   Procedure: EXPLORATORY LAPAROTOMY, PLACEMENT OF ILEOSTOMY TUBES, PARTIAL CLOSURE OF FASCIA, WOUND VAC EXCHANGE;  Surgeon: [name], Arta Bruce, MD;  Location: [facility];  Service: General;  Laterality: N/A;   LAPAROTOMY N/A 05/20/2017   Procedure: ABDOMINAL WOUND [place] OUT, WOUND CLOSURE AND WOUND [place];  Surgeon: [name], Arta Bruce, MD;  Location: [facility];  Service: General;  Laterality: N/A;   LAPAROTOMY N/A 07/31/2017   Procedure: EXPLORATORY LAPAROTOMY WITH CLOSURE OF ENTEROTOMICS;  Surgeon: Johnathan Hausen, MD;  Location: [facility];  Service: General;  Laterality: N/A;   SHOULDER ARTHROSCOPY W/ ROTATOR CUFF REPAIR Left 07/16/2010   SMALL INTESTINE SURGERY  04/24/2000   adhesiolysis, ileocolic anastomosis   UPPER GASTROINTESTINAL ENDOSCOPY      FAMILY HISTORY: Family History  Problem Relation Age of Onset   Colon cancer Father 1   Breast cancer Sister 36   Thyroid disease Neg Hx     SOCIAL  HISTORY: Social History   Socioeconomic History   Marital status: Married    Spouse name: Not on file   Number of children: Not on file   Years of education: Not on file   Highest education level: Not on file  Occupational History   Not on file  Tobacco Use   Smoking status: Former    Packs/day: 1.00    Years: 25.00    Total pack years: 25.00    Types: Cigarettes    Quit date: 2017    Years since quitting: 7.1   Smokeless tobacco: Never   Tobacco comments:    quit smoking 3 years ago  Vaping Use   Vaping Use: Never used  Substance and Sexual Activity   Alcohol use: Yes    Comment: occasionally   Drug use: No   Sexual activity: Not on file  Other Topics Concern   Not on file  Social History Narrative   Lives in [place] with his wife.   Social Determinants of Health   Financial Resource Strain: Not on file  Food Insecurity: Not on file  Transportation Needs: Not on file  Physical Activity: Not on file  Stress: Not on file  Social Connections: Not on file  Intimate Partner Violence: Not on file      Marcial Pacas, M.D. Ph.D.  [facility] [address], [place], [place] [address] Ph: [phone] Fax: [phone]  CC:  Lujean Amel, MD [place],  [place] [address]  Lujean Amel, MD

## 2023-01-02 LAB — C-REACTIVE PROTEIN: CRP: 1 mg/L (ref 0–10)

## 2023-01-02 LAB — SEDIMENTATION RATE: Sed Rate: 2 mm/hr (ref 0–30)

## 2023-01-08 ENCOUNTER — Telehealth: Payer: Self-pay | Admitting: Neurology

## 2023-01-08 NOTE — Telephone Encounter (Signed)
[name] [name] Josem Kaufmann: [id number] exp. 01/08/23-02/07/23 sent to GI [phone]

## 2023-01-22 DIAGNOSIS — H52223 Regular astigmatism, bilateral: Secondary | ICD-10-CM | POA: Diagnosis not present

## 2023-01-22 DIAGNOSIS — H524 Presbyopia: Secondary | ICD-10-CM | POA: Diagnosis not present

## 2023-02-02 DIAGNOSIS — E538 Deficiency of other specified B group vitamins: Secondary | ICD-10-CM | POA: Diagnosis not present

## 2023-02-05 ENCOUNTER — Other Ambulatory Visit: Payer: Medicare HMO

## 2023-02-11 ENCOUNTER — Encounter (HOSPITAL_COMMUNITY): Payer: Self-pay | Admitting: Psychiatry

## 2023-02-11 ENCOUNTER — Telehealth (HOSPITAL_BASED_OUTPATIENT_CLINIC_OR_DEPARTMENT_OTHER): Payer: Medicare HMO | Admitting: Psychiatry

## 2023-02-11 DIAGNOSIS — F410 Panic disorder [episodic paroxysmal anxiety] without agoraphobia: Secondary | ICD-10-CM | POA: Diagnosis not present

## 2023-02-11 DIAGNOSIS — F411 Generalized anxiety disorder: Secondary | ICD-10-CM

## 2023-02-11 MED ORDER — TRAZODONE HCL 50 MG PO TABS: 50.0000 mg | ORAL_TABLET | Freq: Every day | ORAL | 2 refills | Status: DC

## 2023-02-11 MED ORDER — LORAZEPAM 1 MG PO TABS: 1.0000 mg | ORAL_TABLET | Freq: Two times a day (BID) | ORAL | 2 refills | Status: DC | PRN

## 2023-02-11 NOTE — Progress Notes (Signed)
[facility] MD Virtual Progress Note   Patient Location: Home Provider Location: Home Office  I connect with patient by telephone and verified that I am speaking with correct person by using two identifiers. I discussed the limitations of evaluation and management by telemedicine and the availability of in person appointments. I also discussed with the patient that there may be a patient responsible charge related to this service. The patient expressed understanding and agreed to proceed.  [name] Villella Sr. [id number] 62 y.o.  02/11/2023 10:10 AM  History of Present Illness:  Patient is evaluated by phone session.  He recently seen the neurology however did not feel any help.  He was hoping to get treatment for his headaches and tremors but no new medication added.  He was recommended CT scan and MRI but he is not interested to pursue further.  He is taking trazodone and Ativan.  He used to take trazodone higher dose but slowly and gradually cut down because of side effects.  He reported continued to have brain zaps, tremors, headaches but does not want to stop the trazodone, because he cannot sleep.  He admitted anxiety and nervousness.  Denies any major panic attack but also taking the Ativan 1 mg 2 times a day.  He also taking propranolol which is prescribed by his PCP for tremors.  He has not seen a significant improvement but also does not want to stop the medication.  He denies any crying spells or any feeling of hopelessness or worthlessness.  He denies any paranoia, hallucination, suicidal thoughts.  Like to try a different medication however given the history that he had tried numerous medication and also he had interaction with multiple medication as per genetic testing I will defer adding new medication.  He understands that the medication may cause side effects and after discussion he agreed to keep on the same medication.  His appetite is okay.  His weight is stable.  We  have recommended therapy but he has not received any phone calls.  Past Psychiatric History: H/O anxiety since early 76s.  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  No h/o mania, psychosis, suicidal attempt, inpatient treatment.  Seen and managed by PCP since early 30s. Tried Remeron, hydroxyzine, gabapentin, Paxil, Prozac, Zoloft, Cymbalta and Lamictal.  Trintellix helped but in the emergency room it was discontinued as patient complaining of tremors.  Pristiq could not tolerate more than 25 mg.   Gene-Sight shows Cymbalta, mirtazapine, Paxil, Luvox, clozapine, olanzapine, thiothixene has significant drug interaction.     Outpatient Encounter Medications as of 02/11/2023  Medication Sig   amLODipine (NORVASC) 10 MG tablet Take 10 mg by mouth daily.    betamethasone dipropionate 0.05 % cream Apply 1 application topically 2 (two) times daily as needed (rash). (Patient not taking: Reported on 09/24/2022)   cyclobenzaprine (FLEXERIL) 10 MG tablet Take 0.5-1 tablets (5-10 mg total) by mouth 3 (three) times daily as needed for muscle spasms.   famotidine (PEPCID) 40 MG tablet Take 20 mg by mouth 2 (two) times daily.   hydrochlorothiazide (HYDRODIURIL) 12.5 MG tablet Take 3.125 mg by mouth daily.  (Patient not taking: Reported on 11/06/2022)   ibuprofen (ADVIL) 200 MG tablet Take 600 mg by mouth every 6 (six) hours as needed for moderate pain.   LORazepam (ATIVAN) 1 MG tablet Take 1 tablet (1 mg total) by mouth 2 (two) times daily as needed for anxiety.   Multiple Vitamin (MULTIVITAMIN  WITH MINERALS) TABS Take 1 tablet by mouth daily.   ondansetron (ZOFRAN) 4 MG tablet Take 1 tablet (4 mg total) by mouth every 6 (six) hours.   oxyCODONE-acetaminophen (PERCOCET) 10-325 MG tablet Take 1 tablet by mouth every 6 (six) hours as needed for pain.   Probiotic Product (PROBIOTIC PO) Take 1 capsule by mouth daily.   propranolol (INDERAL) 20 MG tablet 20 mg in the morning and at bedtime.    psyllium (METAMUCIL) 58.6 % packet Take 1 packet by mouth every other day.   sildenafil (VIAGRA) 100 MG tablet Take 100 mg by mouth daily as needed for erectile dysfunction.   SUMAtriptan (IMITREX) 50 MG tablet Take 1 tablet (50 mg total) by mouth every 2 (two) hours as needed. May repeat in 2 hours if headache persists or recurs.   tamsulosin (FLOMAX) 0.4 MG CAPS capsule Take 1 capsule (0.4 mg total) by mouth daily. (Patient not taking: Reported on 11/06/2022)   telmisartan (MICARDIS) 80 MG tablet Take 80 mg by mouth at bedtime.   traZODone (DESYREL) 50 MG tablet Take 1 tablet (50 mg total) by mouth at bedtime.   [DISCONTINUED] simethicone (MYLICON) 80 MG chewable tablet Chew 80 mg by mouth every 6 (six) hours as needed for flatulence.   No facility-administered encounter medications on file as of 02/11/2023.    Recent Results (from the past 2160 hour(s))  Sedimentation rate     Status: None   Collection Time: 01/01/23 11:16 AM  Result Value Ref Range   Sed Rate 2 0 - 30 mm/hr  C-reactive protein     Status: None   Collection Time: 01/01/23 11:16 AM  Result Value Ref Range   CRP 1 0 - 10 mg/L     Psychiatric Specialty Exam: Physical Exam  Review of Systems  Weight 196 lb (88.9 kg).There is no height or weight on file to calculate BMI.  General Appearance: NA  Eye Contact:  NA  Speech:  Normal Rate  Volume:  Normal  Mood:  Dysphoric  Affect:  NA  Thought Process:  Goal Directed  Orientation:  Full (Time, Place, and Person)  Thought Content:  Rumination  Suicidal Thoughts:  No  Homicidal Thoughts:  No  Memory:  Immediate;   Good Recent;   Good Remote;   Good  Judgement:  Intact  Insight:  Present  Psychomotor Activity:  Tremor  Concentration:  Concentration: Good and Attention Span: Good  Recall:  Good  Fund of Knowledge:  Good  Language:  Good  Akathisia:  No  Handed:  Right  AIMS (if indicated):     Assets:  Communication Skills Desire for  Improvement Housing Transportation  ADL's:  Intact  Cognition:  WNL  Sleep:  4-5 hrs     Assessment/Plan: Panic attack - Plan: LORazepam (ATIVAN) 1 MG tablet  Generalized anxiety disorder - Plan: traZODone (DESYREL) 50 MG tablet  I reviewed notes from neurology.  No new medication added.  He is not interested in neuroimaging studies.  He still had tremors, headaches and sometimes he feels brain zapping.  Discussed chronic symptoms and need therapy.  He is sleeping 4 to 5 hours with the trazodone 50 mg.  Like to slowly cut down to see if he can come off from trazodone if trazodone causing the side effects.  However in the past he had stopped the medication and his anxiety got worse.  We also discussed about not to add more medication due to the prior history.  However I  will strongly encouraged to see a therapist.  Continue trazodone 50 mg at bedtime, Ativan 1 mg 2 times a day as needed.  He is also taking propranolol from primary care doctor.  Discussed medication side effects and benefits.  Recommend to call us back if there is any question or any concern.  Follow-up in 3 months.   Follow Up Instructions:     I discussed the assessment and treatment plan with the patient. The patient was provided an opportunity to ask questions and all were answered. The patient agreed with the plan and demonstrated an understanding of the instructions.   The patient was advised to call back or seek an in-person evaluation if the symptoms worsen or if the condition fails to improve as anticipated.    Collaboration of Care: Other provider involved in patient's care AEB notes are available in epic to review.  Patient/Guardian was advised Release of Information must be obtained prior to any record release in order to collaborate their care with an outside provider. Patient/Guardian was advised if they have not already done so to contact the registration department to sign all necessary forms in order for [name]  to release information regarding their care.   Consent: Patient/Guardian gives verbal consent for treatment and assignment of benefits for services provided during this visit. Patient/Guardian expressed understanding and agreed to proceed.     I provided 28 minutes of non face to face time during this encounter.  Note: This document was prepared by [employer] voice dictation technology and any errors that results from this process are unintentional.    Cleotis Nipper, MD 02/11/2023

## 2023-02-12 DIAGNOSIS — M542 Cervicalgia: Secondary | ICD-10-CM | POA: Diagnosis not present

## 2023-02-12 DIAGNOSIS — F1120 Opioid dependence, uncomplicated: Secondary | ICD-10-CM | POA: Diagnosis not present

## 2023-02-12 DIAGNOSIS — G894 Chronic pain syndrome: Secondary | ICD-10-CM | POA: Diagnosis not present

## 2023-02-12 DIAGNOSIS — M5416 Radiculopathy, lumbar region: Secondary | ICD-10-CM | POA: Diagnosis not present

## 2023-03-03 ENCOUNTER — Other Ambulatory Visit: Payer: Medicare HMO

## 2023-03-05 DIAGNOSIS — E538 Deficiency of other specified B group vitamins: Secondary | ICD-10-CM | POA: Diagnosis not present

## 2023-03-05 DIAGNOSIS — E559 Vitamin D deficiency, unspecified: Secondary | ICD-10-CM | POA: Diagnosis not present

## 2023-03-20 ENCOUNTER — Ambulatory Visit (HOSPITAL_BASED_OUTPATIENT_CLINIC_OR_DEPARTMENT_OTHER)
Admission: RE | Admit: 2023-03-20 | Discharge: 2023-03-20 | Disposition: A | Discharge status: Home / Self Care | Payer: Medicare HMO | Source: Ambulatory Visit | Attending: Family Medicine | Admitting: Family Medicine

## 2023-03-20 DIAGNOSIS — Z122 Encounter for screening for malignant neoplasm of respiratory organs: Secondary | ICD-10-CM | POA: Diagnosis not present

## 2023-03-20 DIAGNOSIS — Z87891 Personal history of nicotine dependence: Secondary | ICD-10-CM | POA: Insufficient documentation

## 2023-03-27 DIAGNOSIS — E538 Deficiency of other specified B group vitamins: Secondary | ICD-10-CM | POA: Diagnosis not present

## 2023-03-30 ENCOUNTER — Other Ambulatory Visit: Payer: Self-pay

## 2023-03-30 DIAGNOSIS — Z122 Encounter for screening for malignant neoplasm of respiratory organs: Secondary | ICD-10-CM

## 2023-03-30 DIAGNOSIS — Z87891 Personal history of nicotine dependence: Secondary | ICD-10-CM

## 2023-03-31 ENCOUNTER — Telehealth (HOSPITAL_BASED_OUTPATIENT_CLINIC_OR_DEPARTMENT_OTHER): Payer: Medicare HMO | Admitting: Psychiatry

## 2023-03-31 DIAGNOSIS — R251 Tremor, unspecified: Secondary | ICD-10-CM

## 2023-03-31 DIAGNOSIS — F410 Panic disorder [episodic paroxysmal anxiety] without agoraphobia: Secondary | ICD-10-CM

## 2023-03-31 DIAGNOSIS — F411 Generalized anxiety disorder: Secondary | ICD-10-CM | POA: Diagnosis not present

## 2023-03-31 NOTE — Progress Notes (Signed)
[facility] MD Virtual Progress Note   Patient Location: Home Provider Location: Home Office  I connect with patient by telephone and verified that I am speaking with correct person by using two identifiers. I discussed the limitations of evaluation and management by telemedicine and the availability of in person appointments. I also discussed with the patient that there may be a patient responsible charge related to this service. The patient expressed understanding and agreed to proceed.  [name] [name] Sr. [id number] 62 y.o.  03/31/2023 10:52 AM  History of Present Illness:  Patient requested an earlier appointment because recently he finds that he has very low B12 level and low vitamin D.  He has been taking vitamin D for past 3 months but has not seen marginal improvement.  He has blood work last week and he has not received the injection B12 this Friday.  He admitted before the blood work he was having a lot of fatigue, depression, poor sleep, anxiety.  He reported he was doing fine until he saw the level which was very low.  We do not have blood work results because his primary care is not part of [facility] and he is also not on access everywhere.  Patient not to try a different medication for sleep and anxiety.  When asked about sleep he is taking trazodone and getting 4 to 5 hours which is his baseline.  I discussed that he had tried numerous medication in the past and we have get genetic testing in which he used the medicine according to genetic testing results the patient either developed tremors, anxiety, worsening and could not handle.  He feels that Ativan not helping because he still have anxiety but does not want to stop because afraid of the withdrawals.  He reported last week was very rough because he believes due to low level of vitamin D and B12.  He denies any hallucination, paranoia, suicidal thoughts.  We have recommended therapy but he has not done or  started yet but now he is willing to try.  Past Psychiatric History: H/O anxiety since early 31s.  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  No h/o mania, psychosis, suicidal attempt, inpatient treatment.  Seen and managed by PCP since early 30s. Tried Remeron, hydroxyzine, gabapentin, Paxil, Prozac, Zoloft, Cymbalta and Lamictal.  Trintellix helped but in the emergency room it was discontinued as patient complaining of tremors.  Pristiq could not tolerate more than 25 mg.   Gene-Sight shows Cymbalta, mirtazapine, Paxil, Luvox, clozapine, olanzapine, thiothixene has significant drug interaction.     Outpatient Encounter Medications as of 03/31/2023  Medication Sig   amLODipine (NORVASC) 10 MG tablet Take 10 mg by mouth daily.    betamethasone dipropionate 0.05 % cream Apply 1 application topically 2 (two) times daily as needed (rash). (Patient not taking: Reported on 09/24/2022)   cyclobenzaprine (FLEXERIL) 10 MG tablet Take 0.5-1 tablets (5-10 mg total) by mouth 3 (three) times daily as needed for muscle spasms.   famotidine (PEPCID) 40 MG tablet Take 20 mg by mouth 2 (two) times daily.   hydrochlorothiazide (HYDRODIURIL) 12.5 MG tablet Take 3.125 mg by mouth daily.  (Patient not taking: Reported on 11/06/2022)   ibuprofen (ADVIL) 200 MG tablet Take 600 mg by mouth every 6 (six) hours as needed for moderate pain.   LORazepam (ATIVAN) 1 MG tablet Take 1 tablet (1 mg total) by mouth 2 (two) times daily as needed for  anxiety.   Multiple Vitamin (MULTIVITAMIN WITH MINERALS) TABS Take 1 tablet by mouth daily.   ondansetron (ZOFRAN) 4 MG tablet Take 1 tablet (4 mg total) by mouth every 6 (six) hours.   oxyCODONE-acetaminophen (PERCOCET) 10-325 MG tablet Take 1 tablet by mouth every 6 (six) hours as needed for pain.   Probiotic Product (PROBIOTIC PO) Take 1 capsule by mouth daily.   propranolol (INDERAL) 20 MG tablet 20 mg in the morning and at bedtime.   psyllium (METAMUCIL) 58.6 %  packet Take 1 packet by mouth every other day.   sildenafil (VIAGRA) 100 MG tablet Take 100 mg by mouth daily as needed for erectile dysfunction.   SUMAtriptan (IMITREX) 50 MG tablet Take 1 tablet (50 mg total) by mouth every 2 (two) hours as needed. May repeat in 2 hours if headache persists or recurs.   tamsulosin (FLOMAX) 0.4 MG CAPS capsule Take 1 capsule (0.4 mg total) by mouth daily. (Patient not taking: Reported on 11/06/2022)   telmisartan (MICARDIS) 80 MG tablet Take 80 mg by mouth at bedtime.   traZODone (DESYREL) 50 MG tablet Take 1 tablet (50 mg total) by mouth at bedtime.   [DISCONTINUED] simethicone (MYLICON) 80 MG chewable tablet Chew 80 mg by mouth every 6 (six) hours as needed for flatulence.   No facility-administered encounter medications on file as of 03/31/2023.    Recent Results (from the past 2160 hour(s))  Sedimentation rate     Status: None   Collection Time: 01/01/23 11:16 AM  Result Value Ref Range   Sed Rate 2 0 - 30 mm/hr  C-reactive protein     Status: None   Collection Time: 01/01/23 11:16 AM  Result Value Ref Range   CRP 1 0 - 10 mg/L     Psychiatric Specialty Exam: Physical Exam  Review of Systems  There were no vitals taken for this visit.There is no height or weight on file to calculate BMI.  General Appearance: NA  Eye Contact:  NA  Speech:  Slow  Volume:  Decreased  Mood:  Anxious  Affect:  NA  Thought Process:  Goal Directed  Orientation:  Full (Time, Place, and Person)  Thought Content:  Rumination  Suicidal Thoughts:  No  Homicidal Thoughts:  No  Memory:  Immediate;   Good Recent;   Good Remote;   Fair  Judgement:  Fair  Insight:  Shallow  Psychomotor Activity:  NA  Concentration:  Concentration: Fair and Attention Span: Fair  Recall:  Good  Fund of Knowledge:  Good  Language:  Good  Akathisia:  No  Handed:  Right  AIMS (if indicated):     Assets:  Communication Skills Desire for Improvement Transportation  ADL's:  Intact   Cognition:  WNL  Sleep: 4-5 hours.     Assessment/Plan: Generalized anxiety disorder  Panic attack  Tremor  Reassurance given that he need to wait until his B12 and folic acid level back to normal.  He is frustrated because levels are not getting better despite taking the injection and supplements.  I encouraged he need to talk to his primary care for further intervention.  Like to try a different medication however given the history I am reluctant to try new medication as patient is very sensitive with the side effects.  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  After he saw the levels are low he  started noticing worsening and correlate his symptoms repeat for the blood work.  Encourage he need to see a therapist to help his coping skills and control his anxiety.  We will refer him again for therapy.  No medicine changes until we have blood work results and he should wait for his level become therapeutic of vitamin B12 and vitamin D.  Follow-up in 6 weeks as patient like to keep the appointment in July.  He admitted since he received the injection he is doing somewhat better but is still wondering if he can change the medication.   Follow Up Instructions:     I discussed the assessment and treatment plan with the patient. The patient was provided an opportunity to ask questions and all were answered. The patient agreed with the plan and demonstrated an understanding of the instructions.   The patient was advised to call back or seek an in-person evaluation if the symptoms worsen or if the condition fails to improve as anticipated.    Collaboration of Care: Other provider involved in patient's care AEB notes are available in epic to review.  Patient/Guardian was advised Release of Information must be obtained prior to any record release in order to collaborate their care with an outside provider.  Patient/Guardian was advised if they have not already done so to contact the registration department to sign all necessary forms in order for [name] to release information regarding their care.   Consent: Patient/Guardian gives verbal consent for treatment and assignment of benefits for services provided during this visit. Patient/Guardian expressed understanding and agreed to proceed.     I provided 15 minutes of non face to face time during this encounter.  Note: This document was prepared by [employer] voice dictation technology and any errors that results from this process are unintentional.    Cleotis Nipper, MD 03/31/2023

## 2023-04-06 DIAGNOSIS — K219 Gastro-esophageal reflux disease without esophagitis: Secondary | ICD-10-CM | POA: Diagnosis not present

## 2023-04-06 DIAGNOSIS — M545 Low back pain, unspecified: Secondary | ICD-10-CM | POA: Diagnosis not present

## 2023-04-22 DIAGNOSIS — E538 Deficiency of other specified B group vitamins: Secondary | ICD-10-CM | POA: Diagnosis not present

## 2023-04-29 DIAGNOSIS — F1120 Opioid dependence, uncomplicated: Secondary | ICD-10-CM | POA: Diagnosis not present

## 2023-04-29 DIAGNOSIS — M542 Cervicalgia: Secondary | ICD-10-CM | POA: Diagnosis not present

## 2023-04-29 DIAGNOSIS — M5416 Radiculopathy, lumbar region: Secondary | ICD-10-CM | POA: Diagnosis not present

## 2023-04-29 DIAGNOSIS — G894 Chronic pain syndrome: Secondary | ICD-10-CM | POA: Diagnosis not present

## 2023-05-11 DIAGNOSIS — I700 Atherosclerosis of aorta: Secondary | ICD-10-CM | POA: Diagnosis not present

## 2023-05-11 DIAGNOSIS — E538 Deficiency of other specified B group vitamins: Secondary | ICD-10-CM | POA: Diagnosis not present

## 2023-05-11 DIAGNOSIS — J439 Emphysema, unspecified: Secondary | ICD-10-CM | POA: Diagnosis not present

## 2023-05-11 DIAGNOSIS — N200 Calculus of kidney: Secondary | ICD-10-CM | POA: Diagnosis not present

## 2023-05-11 DIAGNOSIS — E559 Vitamin D deficiency, unspecified: Secondary | ICD-10-CM | POA: Diagnosis not present

## 2023-05-11 DIAGNOSIS — Z79899 Other long term (current) drug therapy: Secondary | ICD-10-CM | POA: Diagnosis not present

## 2023-05-13 ENCOUNTER — Telehealth (HOSPITAL_COMMUNITY): Payer: Self-pay | Admitting: *Deleted

## 2023-05-13 ENCOUNTER — Other Ambulatory Visit (HOSPITAL_COMMUNITY): Payer: Self-pay | Admitting: *Deleted

## 2023-05-13 ENCOUNTER — Encounter (HOSPITAL_COMMUNITY): Payer: Self-pay | Admitting: Psychiatry

## 2023-05-13 ENCOUNTER — Telehealth (HOSPITAL_BASED_OUTPATIENT_CLINIC_OR_DEPARTMENT_OTHER): Payer: Medicare HMO | Admitting: Psychiatry

## 2023-05-13 VITALS — Wt 202.0 lb

## 2023-05-13 DIAGNOSIS — F410 Panic disorder [episodic paroxysmal anxiety] without agoraphobia: Secondary | ICD-10-CM | POA: Diagnosis not present

## 2023-05-13 DIAGNOSIS — R251 Tremor, unspecified: Secondary | ICD-10-CM | POA: Diagnosis not present

## 2023-05-13 DIAGNOSIS — F411 Generalized anxiety disorder: Secondary | ICD-10-CM

## 2023-05-13 MED ORDER — VILAZODONE HCL 10 MG PO TABS: 10.0000 mg | ORAL_TABLET | Freq: Every day | ORAL | 0 refills | Status: DC

## 2023-05-13 MED ORDER — TRAZODONE HCL 50 MG PO TABS: 50.0000 mg | ORAL_TABLET | Freq: Every day | ORAL | 2 refills | Status: DC

## 2023-05-13 MED ORDER — CLONAZEPAM 1 MG PO TABS: 1.0000 mg | ORAL_TABLET | Freq: Two times a day (BID) | ORAL | 0 refills | Status: DC | PRN

## 2023-05-13 NOTE — Telephone Encounter (Signed)
PA for Vilazodone 10 mg tablet has been Approved by [facility] from 05/13/23 through 10/27/2023.

## 2023-05-13 NOTE — Telephone Encounter (Signed)
LVM for pt advising that we do not have samples of Viibryd due to medication being generic now. Pt advised that if pharmacy contacts him regarding a PA he can contact me directly. Pt also advised that pharmacy/insurance should contact us if PA is required. Pt encouraged to call back with any questions or concerns.

## 2023-05-13 NOTE — Progress Notes (Signed)
[facility] MD Virtual Progress Note   Patient Location: Home Provider Location: Home Office  I connect with patient by telephone and verified that I am speaking with correct person by using two identifiers. I discussed the limitations of evaluation and management by telemedicine and the availability of in person appointments. I also discussed with the patient that there may be a patient responsible charge related to this service. The patient expressed understanding and agreed to proceed.  [name] Scheiber Sr. [id number] 62 y.o.  05/13/2023 10:05 AM  History of Present Illness:  Patient is evaluated by phone session.  He continues to have anxiety and panic attacks.  Recently had a visit with primary care and his vitamin D level improved 27 but he continued to struggle with fatigue, lack of motivation, extreme anxiety and nervousness.  Now he like to try Viibryd which was recommended by his primary care.  He had tried Trintellix but stopped after having tremors even though it did not work.  He does not feel the Ativan working as good as he feel his anxiety comes back.  He sleeps 4 to 5 hours and taking trazodone.  Patient has chronic tremors and most of the time antidepressant can cause worsening of tremors.  We have recommended therapy and patient finally have some referrals that he is working on it.  Denies any suicidal thoughts or homicidal thoughts.  He denies any paranoia, hallucination.  His appetite is okay.  He is taking Tylenol and Ativan.  In the past he had tried multiple medication and also have genetic testing and medicines virtual according to the testing results.  Past Psychiatric History: H/O anxiety since early 52s.  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  No h/o mania, psychosis, suicidal attempt, inpatient treatment.  Seen and managed by PCP since early 30s. Tried Remeron, hydroxyzine, gabapentin, Paxil, Prozac, Zoloft, Cymbalta and Lamictal.   Trintellix helped but in the emergency room it was discontinued as patient complaining of tremors.  Pristiq could not tolerate more than 25 mg.   Gene-Sight shows Cymbalta, mirtazapine, Paxil, Luvox, clozapine, olanzapine, thiothixene has significant drug interaction.     Outpatient Encounter Medications as of 05/13/2023  Medication Sig   amLODipine (NORVASC) 10 MG tablet Take 10 mg by mouth daily.    betamethasone dipropionate 0.05 % cream Apply 1 application topically 2 (two) times daily as needed (rash). (Patient not taking: Reported on 09/24/2022)   cyclobenzaprine (FLEXERIL) 10 MG tablet Take 0.5-1 tablets (5-10 mg total) by mouth 3 (three) times daily as needed for muscle spasms.   famotidine (PEPCID) 40 MG tablet Take 20 mg by mouth 2 (two) times daily.   hydrochlorothiazide (HYDRODIURIL) 12.5 MG tablet Take 3.125 mg by mouth daily.  (Patient not taking: Reported on 11/06/2022)   ibuprofen (ADVIL) 200 MG tablet Take 600 mg by mouth every 6 (six) hours as needed for moderate pain.   LORazepam (ATIVAN) 1 MG tablet Take 1 tablet (1 mg total) by mouth 2 (two) times daily as needed for anxiety.   Multiple Vitamin (MULTIVITAMIN WITH MINERALS) TABS Take 1 tablet by mouth daily.   ondansetron (ZOFRAN) 4 MG tablet Take 1 tablet (4 mg total) by mouth every 6 (six) hours.   oxyCODONE-acetaminophen (PERCOCET) 10-325 MG tablet Take 1 tablet by mouth every 6 (six) hours as needed for pain.   Probiotic Product (PROBIOTIC PO) Take 1 capsule by mouth daily.   propranolol (INDERAL) 20 MG tablet 20 mg  in the morning and at bedtime.   psyllium (METAMUCIL) 58.6 % packet Take 1 packet by mouth every other day.   sildenafil (VIAGRA) 100 MG tablet Take 100 mg by mouth daily as needed for erectile dysfunction.   SUMAtriptan (IMITREX) 50 MG tablet Take 1 tablet (50 mg total) by mouth every 2 (two) hours as needed. May repeat in 2 hours if headache persists or recurs.   tamsulosin (FLOMAX) 0.4 MG CAPS capsule Take 1  capsule (0.4 mg total) by mouth daily. (Patient not taking: Reported on 11/06/2022)   telmisartan (MICARDIS) 80 MG tablet Take 80 mg by mouth at bedtime.   traZODone (DESYREL) 50 MG tablet Take 1 tablet (50 mg total) by mouth at bedtime.   [DISCONTINUED] simethicone (MYLICON) 80 MG chewable tablet Chew 80 mg by mouth every 6 (six) hours as needed for flatulence.   No facility-administered encounter medications on file as of 05/13/2023.    No results found for this or any previous visit (from the past 2160 hour(s)).   Psychiatric Specialty Exam: Physical Exam  Review of Systems  Neurological:  Positive for tremors.  Psychiatric/Behavioral:  Positive for sleep disturbance. The patient is nervous/anxious.     Weight 202 lb (91.6 kg).There is no height or weight on file to calculate BMI.  General Appearance: NA  Eye Contact:  NA  Speech:  Slow  Volume:  Decreased  Mood:  Anxious and Dysphoric  Affect:  NA  Thought Process:  Goal Directed  Orientation:  Full (Time, Place, and Person)  Thought Content:  Rumination  Suicidal Thoughts:  No  Homicidal Thoughts:  No  Memory:  Immediate;   Good Recent;   Good Remote;   Good  Judgement:  Fair  Insight:  Shallow  Psychomotor Activity:  NA  Concentration:  Concentration: Fair and Attention Span: Fair  Recall:  Good  Fund of Knowledge:  Good  Language:  Good  Akathisia:  No  Handed:  Right  AIMS (if indicated):     Assets:  Communication Skills Desire for Improvement Housing Transportation  ADL's:  Intact  Cognition:  WNL  Sleep:  fair     Assessment/Plan: Generalized anxiety disorder - Plan: traZODone (DESYREL) 50 MG tablet, Vilazodone HCl (VIIBRYD) 10 MG TABS, clonazePAM (KLONOPIN) 1 MG tablet  Panic attack - Plan: Vilazodone HCl (VIIBRYD) 10 MG TABS, clonazePAM (KLONOPIN) 1 MG tablet  Tremor - Plan: clonazePAM (KLONOPIN) 1 MG tablet  I reviewed notes from his primary care physician at [facility] physician.  His vitamin D level  improved from the past.  He still struggle with fatigue, anxiety and panic attack.  We will try Viibryd 10 mg however I explained since he had Trintellix in the past that causes tremors there is a chance this medicine may cause tremors but he need to give more time to the medication.  Patient also feels the Ativan is not working anymore and we will try Klonopin 1 mg twice a day as needed to help the anxiety and panic attack.  I explained the difference between Ativan and Klonopin.  I also emphasize and encourage that he need to see a therapist for chronic anxiety and panic attack.  Patient is working on it.  Continue trazodone 50 mg as a higher dose has caused tremors.  Follow-up in 4 weeks.  We will provide samples if available of Viibryd.   Follow Up Instructions:     I discussed the assessment and treatment plan with the patient. The patient was provided  an opportunity to ask questions and all were answered. The patient agreed with the plan and demonstrated an understanding of the instructions.   The patient was advised to call back or seek an in-person evaluation if the symptoms worsen or if the condition fails to improve as anticipated.    Collaboration of Care: Other provider involved in patient's care AEB notes are available in epic to review.  Patient/Guardian was advised Release of Information must be obtained prior to any record release in order to collaborate their care with an outside provider. Patient/Guardian was advised if they have not already done so to contact the registration department to sign all necessary forms in order for [name] to release information regarding their care.   Consent: Patient/Guardian gives verbal consent for treatment and assignment of benefits for services provided during this visit. Patient/Guardian expressed understanding and agreed to proceed.     I provided 25 minutes of non face to face time during this encounter.  Note: This document was prepared by  [employer] voice dictation technology and any errors that results from this process are unintentional.    Cleotis Nipper, MD 05/13/2023

## 2023-05-18 ENCOUNTER — Other Ambulatory Visit (HOSPITAL_COMMUNITY): Payer: Self-pay | Admitting: Psychiatry

## 2023-05-18 ENCOUNTER — Telehealth (HOSPITAL_COMMUNITY): Payer: Self-pay | Admitting: *Deleted

## 2023-05-18 MED ORDER — LORAZEPAM 1 MG PO TABS: 1.0000 mg | ORAL_TABLET | Freq: Two times a day (BID) | ORAL | 0 refills | Status: DC

## 2023-05-18 NOTE — Telephone Encounter (Signed)
Pt of Dr. Sheela Stack who called requesting to return to using Ativan 1 mg BID. Medication was changed during pt last visit, 05/13/23, to Klonopin 1 mg BID. Pt called c/o that he had a panic attack already since trying Klonopin. Pt used previously 2 years ago briefly per EMR. Lorazepam last e-escribed on  02/10/26 for #60 with 2 refills. Pt states he does not have enough left to wait until Dr. Lolly Mustache returns. Please review and advise.

## 2023-05-18 NOTE — Telephone Encounter (Signed)
30 day supply sent

## 2023-05-27 ENCOUNTER — Other Ambulatory Visit (HOSPITAL_COMMUNITY): Payer: Self-pay | Admitting: Psychiatry

## 2023-05-27 DIAGNOSIS — F411 Generalized anxiety disorder: Secondary | ICD-10-CM

## 2023-05-27 DIAGNOSIS — F410 Panic disorder [episodic paroxysmal anxiety] without agoraphobia: Secondary | ICD-10-CM

## 2023-06-02 DIAGNOSIS — E538 Deficiency of other specified B group vitamins: Secondary | ICD-10-CM | POA: Diagnosis not present

## 2023-06-15 ENCOUNTER — Telehealth (HOSPITAL_COMMUNITY): Payer: Self-pay

## 2023-06-15 NOTE — Telephone Encounter (Signed)
Patient called to say that the Viibryd is causing anxiety and panic attacks - he is still taking it, last dose was last night. Please review and advise, thank you

## 2023-06-15 NOTE — Telephone Encounter (Signed)
He can discontinue Viibryd.  We have tried multiple medication and most of them either they had side effects or did not work.  He did need to start think about therapy.  We will not provide any new medication at this time.

## 2023-06-17 ENCOUNTER — Encounter (HOSPITAL_COMMUNITY): Payer: Self-pay | Admitting: Licensed Clinical Social Worker

## 2023-06-17 ENCOUNTER — Ambulatory Visit (INDEPENDENT_AMBULATORY_CARE_PROVIDER_SITE_OTHER): Payer: Medicare HMO | Admitting: Licensed Clinical Social Worker

## 2023-06-17 ENCOUNTER — Encounter (HOSPITAL_COMMUNITY): Payer: Self-pay

## 2023-06-17 DIAGNOSIS — F410 Panic disorder [episodic paroxysmal anxiety] without agoraphobia: Secondary | ICD-10-CM

## 2023-06-17 DIAGNOSIS — F4310 Post-traumatic stress disorder, unspecified: Secondary | ICD-10-CM | POA: Diagnosis not present
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
## 2023-06-19 ENCOUNTER — Encounter (HOSPITAL_COMMUNITY): Payer: Self-pay | Admitting: Psychiatry

## 2023-06-19 ENCOUNTER — Telehealth (HOSPITAL_BASED_OUTPATIENT_CLINIC_OR_DEPARTMENT_OTHER): Payer: Medicare HMO | Admitting: Psychiatry

## 2023-06-19 VITALS — Wt 197.0 lb

## 2023-06-19 DIAGNOSIS — F410 Panic disorder [episodic paroxysmal anxiety] without agoraphobia: Secondary | ICD-10-CM

## 2023-06-19 DIAGNOSIS — F4310 Post-traumatic stress disorder, unspecified: Secondary | ICD-10-CM

## 2023-06-19 DIAGNOSIS — F411 Generalized anxiety disorder: Secondary | ICD-10-CM

## 2023-06-19 MED ORDER — ESCITALOPRAM OXALATE 5 MG PO TABS: 5.0000 mg | ORAL_TABLET | Freq: Every day | ORAL | 1 refills | Status: DC

## 2023-06-19 NOTE — Progress Notes (Signed)
[facility] MD Virtual Progress Note   Patient Location: Home Provider Location: Home Office  I connect with patient by video and verified that I am speaking with correct person by using two identifiers. I discussed the limitations of evaluation and management by telemedicine and the availability of in person appointments. I also discussed with the patient that there may be a patient responsible charge related to this service. The patient expressed understanding and agreed to proceed.  [name] Guman Sr. [id number] 62 y.o.  06/19/2023 9:04 AM  History of Present Illness:  Patient is evaluated by video session.  On the last visit we tried Viibryd as per his own request but after a week he could not tolerate and started to have diarrhea and abdominal pain.  He did not notice improvement in the anxiety.  He had left a message and be recommended to see a therapist.  He had a first appointment with Shanda Bumps and he had planned to continue for therapy.  Patient now like to go back on Lexapro which she took for 4 months but after 4 months he stopped after he did not see any improvement.  Patient noticed he need to be on some medication because he having a lot of anxiety, nervousness, anxiousness.  He had tried multiple medication and either it does not help or caused side effects.  Majority of the medication causes worsening of tremors.  He also tried Klonopin but that did not help and he is back on Ativan.  He is taking trazodone which helps 4 to 5 hours.  He stopped taking propranolol because he was having dizziness and blood pressure fell down.  He denies any suicidal thoughts or homicidal thoughts.  Occasionally has nightmares but his biggest concern is anxiety.  Denies any suicidal thoughts.  Past Psychiatric History:  ast Psychiatric History: H/O anxiety since early 18s.  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  No h/o mania, psychosis, suicidal attempt,  inpatient treatment.  Seen and managed by PCP since early 30s. Tried Remeron, hydroxyzine, gabapentin, Paxil, Prozac, Zoloft, Cymbalta, Klonopin and Lamictal.  Trintellix helped but in the emergency room it was discontinued as patient complaining of tremors.  Pristiq could not tolerate more than 25 mg.  Viibryd cause diarrhea. Gene-Sight shows Cymbalta, mirtazapine, Paxil, Luvox, clozapine, olanzapine, thiothixene has significant drug interaction.  Since he stopped Viibryd his appetite is coming back and he had gained a few pounds.   Outpatient Encounter Medications as of 06/19/2023  Medication Sig   amLODipine (NORVASC) 10 MG tablet Take 10 mg by mouth daily.    betamethasone dipropionate 0.05 % cream Apply 1 application topically 2 (two) times daily as needed (rash). (Patient not taking: Reported on 09/24/2022)   cyclobenzaprine (FLEXERIL) 10 MG tablet Take 0.5-1 tablets (5-10 mg total) by mouth 3 (three) times daily as needed for muscle spasms.   famotidine (PEPCID) 40 MG tablet Take 20 mg by mouth 2 (two) times daily.   hydrochlorothiazide (HYDRODIURIL) 12.5 MG tablet Take 3.125 mg by mouth daily.  (Patient not taking: Reported on 11/06/2022)   ibuprofen (ADVIL) 200 MG tablet Take 600 mg by mouth every 6 (six) hours as needed for moderate pain.   LORazepam (ATIVAN) 1 MG tablet Take 1 tablet by mouth twice daily as needed for anxiety   Multiple Vitamin (MULTIVITAMIN WITH MINERALS) TABS Take 1 tablet by mouth daily.   ondansetron (ZOFRAN) 4 MG tablet Take 1 tablet (4 mg total) by  mouth every 6 (six) hours.   oxyCODONE-acetaminophen (PERCOCET) 10-325 MG tablet Take 1 tablet by mouth every 6 (six) hours as needed for pain.   Probiotic Product (PROBIOTIC PO) Take 1 capsule by mouth daily.   propranolol (INDERAL) 20 MG tablet 20 mg in the morning and at bedtime.   psyllium (METAMUCIL) 58.6 % packet Take 1 packet by mouth every other day.   sildenafil (VIAGRA) 100 MG tablet Take 100 mg by mouth daily as  needed for erectile dysfunction.   SUMAtriptan (IMITREX) 50 MG tablet Take 1 tablet (50 mg total) by mouth every 2 (two) hours as needed. May repeat in 2 hours if headache persists or recurs.   tamsulosin (FLOMAX) 0.4 MG CAPS capsule Take 1 capsule (0.4 mg total) by mouth daily. (Patient not taking: Reported on 11/06/2022)   telmisartan (MICARDIS) 80 MG tablet Take 80 mg by mouth at bedtime.   traZODone (DESYREL) 50 MG tablet Take 1 tablet (50 mg total) by mouth at bedtime.   Vilazodone HCl (VIIBRYD) 10 MG TABS Take 1 tablet (10 mg total) by mouth daily.   [DISCONTINUED] simethicone (MYLICON) 80 MG chewable tablet Chew 80 mg by mouth every 6 (six) hours as needed for flatulence.   No facility-administered encounter medications on file as of 06/19/2023.    No results found for this or any previous visit (from the past 2160 hour(s)).   Psychiatric Specialty Exam: Physical Exam  Review of Systems  Weight 197 lb (89.4 kg).There is no height or weight on file to calculate BMI.  General Appearance: Casual  Eye Contact:  Fair  Speech:  Normal Rate  Volume:  Decreased  Mood:  Anxious  Affect:  Congruent  Thought Process:  Goal Directed  Orientation:  Full (Time, Place, and Person)  Thought Content:  Rumination  Suicidal Thoughts:  No  Homicidal Thoughts:  No  Memory:  Immediate;   Good Recent;   Good Remote;   Good  Judgement:  Intact  Insight:  Fair  Psychomotor Activity:  Decreased and Tremor  Concentration:  Concentration: Fair and Attention Span: Fair  Recall:  Good  Fund of Knowledge:  Good  Language:  Good  Akathisia:  No  Handed:  Right  AIMS (if indicated):     Assets:  Communication Skills Desire for Improvement Transportation  ADL's:  Intact  Cognition:  WNL  Sleep:  fair     Assessment/Plan: Generalized anxiety disorder - Plan: escitalopram (LEXAPRO) 5 MG tablet  Panic attack - Plan: escitalopram (LEXAPRO) 5 MG tablet  PTSD (post-traumatic stress disorder) -  Plan: escitalopram (LEXAPRO) 5 MG tablet  Patient is no longer taking Viibryd and he also stopped the propranolol after feeling dizziness.  Viibryd because diarrhea and he lost weight.  Now he wants to go back to Lexapro which she took for 4 months.  I explained that he had tried multiple medication and he should focus on therapy alone but patient insists that he buy something to help with anxiety.  He is back on Ativan since Klonopin did not help him.  He is all over taking propranolol.  He is taking vitamin D.  I discussed if Lexapro 5 mg did not help or caused tremors and we will not provide any antidepressant and just need to focus on therapy.  Patient agreed.  We will start Lexapro 2.5 mg for 1 week and then he will take the 5 mg.  He used to take 10 mg but it causes side effects.  I  recommend should stay for 5 mg for a while.  Continue Ativan 1 mg twice a day and trazodone 50 mg as needed for sleep.  He has enough refills for trazodone and Ativan.  Encouraged to keep appointment with Shanda Bumps.  Follow-up in 6 weeks.  I discussed not to tried to any antidepressant because every time he tried either it did not work or because worsening of tremors.  We did talk about getting opinion of neurology but patient insurance does not get a second opinion and his previous neurologist suggested that tremors were caused by antidepressant.  However explained since he is not taking antidepressant at this time and is still having tremors he should consider talking to neurology again.  Recommend exercise, deep breathing to help with anxiety and panic attacks.   Follow Up Instructions:     I discussed the assessment and treatment plan with the patient. The patient was provided an opportunity to ask questions and all were answered. The patient agreed with the plan and demonstrated an understanding of the instructions.   The patient was advised to call back or seek an in-person evaluation if the symptoms worsen or if the  condition fails to improve as anticipated.    Collaboration of Care: Other provider involved in patient's care AEB notes are available in epic to review.  Patient/Guardian was advised Release of Information must be obtained prior to any record release in order to collaborate their care with an outside provider. Patient/Guardian was advised if they have not already done so to contact the registration department to sign all necessary forms in order for [name] to release information regarding their care.   Consent: Patient/Guardian gives verbal consent for treatment and assignment of benefits for services provided during this visit. Patient/Guardian expressed understanding and agreed to proceed.     I provided 24 minutes of non face to face time during this encounter.  Note: This document was prepared by [employer] voice dictation technology and any errors that results from this process are unintentional.    Cleotis Nipper, MD 06/19/2023

## 2023-06-23 DIAGNOSIS — E538 Deficiency of other specified B group vitamins: Secondary | ICD-10-CM | POA: Diagnosis not present

## 2023-06-25 ENCOUNTER — Ambulatory Visit (INDEPENDENT_AMBULATORY_CARE_PROVIDER_SITE_OTHER): Payer: Medicare HMO | Admitting: Licensed Clinical Social Worker

## 2023-06-25 ENCOUNTER — Encounter (HOSPITAL_COMMUNITY): Payer: Self-pay | Admitting: Licensed Clinical Social Worker

## 2023-06-25 DIAGNOSIS — F4310 Post-traumatic stress disorder, unspecified: Secondary | ICD-10-CM | POA: Diagnosis not present
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
## 2023-07-02 DIAGNOSIS — R5383 Other fatigue: Secondary | ICD-10-CM | POA: Diagnosis not present

## 2023-07-02 DIAGNOSIS — E559 Vitamin D deficiency, unspecified: Secondary | ICD-10-CM | POA: Diagnosis not present

## 2023-07-02 DIAGNOSIS — R6882 Decreased libido: Secondary | ICD-10-CM | POA: Diagnosis not present

## 2023-07-06 DIAGNOSIS — R6882 Decreased libido: Secondary | ICD-10-CM | POA: Diagnosis not present

## 2023-07-06 DIAGNOSIS — E559 Vitamin D deficiency, unspecified: Secondary | ICD-10-CM | POA: Diagnosis not present

## 2023-07-06 DIAGNOSIS — N5201 Erectile dysfunction due to arterial insufficiency: Secondary | ICD-10-CM | POA: Diagnosis not present

## 2023-07-14 DIAGNOSIS — E538 Deficiency of other specified B group vitamins: Secondary | ICD-10-CM | POA: Diagnosis not present

## 2023-07-16 ENCOUNTER — Encounter (HOSPITAL_COMMUNITY): Payer: Self-pay | Admitting: Licensed Clinical Social Worker

## 2023-07-16 ENCOUNTER — Ambulatory Visit (INDEPENDENT_AMBULATORY_CARE_PROVIDER_SITE_OTHER): Payer: Medicare HMO | Admitting: Licensed Clinical Social Worker

## 2023-07-16 DIAGNOSIS — F4310 Post-traumatic stress disorder, unspecified: Secondary | ICD-10-CM | POA: Diagnosis not present
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
## 2023-07-17 ENCOUNTER — Telehealth (HOSPITAL_COMMUNITY): Payer: Self-pay | Admitting: Psychiatry

## 2023-08-06 DIAGNOSIS — Z6828 Body mass index (BMI) 28.0-28.9, adult: Secondary | ICD-10-CM | POA: Diagnosis not present

## 2023-08-06 DIAGNOSIS — F1120 Opioid dependence, uncomplicated: Secondary | ICD-10-CM | POA: Diagnosis not present

## 2023-08-06 DIAGNOSIS — G894 Chronic pain syndrome: Secondary | ICD-10-CM | POA: Diagnosis not present

## 2023-08-11 ENCOUNTER — Ambulatory Visit (HOSPITAL_COMMUNITY): Payer: Medicare HMO | Admitting: Licensed Clinical Social Worker

## 2023-08-11 DIAGNOSIS — E538 Deficiency of other specified B group vitamins: Secondary | ICD-10-CM | POA: Diagnosis not present

## 2023-08-12 ENCOUNTER — Other Ambulatory Visit (HOSPITAL_COMMUNITY): Payer: Self-pay | Admitting: Psychiatry

## 2023-08-12 DIAGNOSIS — F411 Generalized anxiety disorder: Secondary | ICD-10-CM

## 2023-08-18 ENCOUNTER — Encounter (HOSPITAL_COMMUNITY): Payer: Self-pay | Admitting: Psychiatry

## 2023-08-18 ENCOUNTER — Telehealth (HOSPITAL_BASED_OUTPATIENT_CLINIC_OR_DEPARTMENT_OTHER): Payer: Medicare HMO | Admitting: Psychiatry

## 2023-08-18 VITALS — Wt 200.0 lb

## 2023-08-18 DIAGNOSIS — F411 Generalized anxiety disorder: Secondary | ICD-10-CM | POA: Diagnosis not present

## 2023-08-18 DIAGNOSIS — F4310 Post-traumatic stress disorder, unspecified: Secondary | ICD-10-CM

## 2023-08-18 DIAGNOSIS — F410 Panic disorder [episodic paroxysmal anxiety] without agoraphobia: Secondary | ICD-10-CM | POA: Diagnosis not present

## 2023-08-18 MED ORDER — LORAZEPAM 1 MG PO TABS: 1.0000 mg | ORAL_TABLET | Freq: Two times a day (BID) | ORAL | 2 refills | Status: DC | PRN

## 2023-08-18 MED ORDER — ESCITALOPRAM OXALATE 5 MG PO TABS: 2.5000 mg | ORAL_TABLET | Freq: Every day | ORAL | 1 refills | Status: DC

## 2023-08-18 MED ORDER — TRAZODONE HCL 50 MG PO TABS: 50.0000 mg | ORAL_TABLET | Freq: Every day | ORAL | 2 refills | Status: DC

## 2023-08-18 NOTE — Progress Notes (Signed)
[facility] MD Virtual Progress Note   Patient Location: In Car Provider Location: Home Office  I connect with patient by video and verified that I am speaking with correct person by using two identifiers. I discussed the limitations of evaluation and management by telemedicine and the availability of in person appointments. I also discussed with the patient that there may be a patient responsible charge related to this service. The patient expressed understanding and agreed to proceed.  [name] Luth Sr. [id number] 62 y.o.  08/18/2023 11:00 AM  History of Present Illness:  Patient is evaluated by video session.  He is now taking Lexapro but could not tolerate more than 2.5 mg.  He reported his sleep is good and his appetite is better but he still have anxiety and nervousness.  Even 2.5 mg of Lexapro because some time shakes, tremors and irritability.  His brain fog remains unchanged.  He is not sure if previous medication Viibryd still having the residual side effects.  He is seeing Shanda Bumps for therapy but due to health reason of the therapist he had not seen Shanda Bumps recently.  He liked to restart therapy when therapist is available.  He reported mild tremors irritability and mood swings but overall anxiety is somewhat better.  He denies any hallucination, paranoia or any suicidal thoughts.  He denies any dizziness.  We had tried multiple medication and he had called back leaving messages about the medication side effects.  He is wondering what else he can do at this time.  I emphasized to keep the Lexapro 2.5 mg since higher dose has caused side effects.  He also encouraged to keep appointment with therapist.  He denies any suicidal thoughts or homicidal thoughts.  No nightmares, no panic attack at this time.  Past Psychiatric History: H/O anxiety since early 66s.  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  No h/o mania, psychosis, suicidal attempt, inpatient  treatment.  Seen and managed by PCP since early 30s. Tried Remeron, hydroxyzine, gabapentin, Paxil, Prozac, Zoloft, Cymbalta, Klonopin and Lamictal.  Trintellix helped but in the emergency room it was discontinued as patient complaining of tremors.  Pristiq could not tolerate more than 25 mg.  Viibryd cause diarrhea. Gene-Sight shows Cymbalta, mirtazapine, Paxil, Luvox, clozapine, olanzapine, thiothixene has significant drug interaction.  Since he stopped Viibryd his appetite is coming back and he had gained a few pounds.    Outpatient Encounter Medications as of 08/18/2023  Medication Sig   amLODipine (NORVASC) 10 MG tablet Take 10 mg by mouth daily.    betamethasone dipropionate 0.05 % cream Apply 1 application topically 2 (two) times daily as needed (rash). (Patient not taking: Reported on 09/24/2022)   cyclobenzaprine (FLEXERIL) 10 MG tablet Take 0.5-1 tablets (5-10 mg total) by mouth 3 (three) times daily as needed for muscle spasms.   escitalopram (LEXAPRO) 5 MG tablet Take 1 tablet (5 mg total) by mouth daily.   famotidine (PEPCID) 40 MG tablet Take 20 mg by mouth 2 (two) times daily.   hydrochlorothiazide (HYDRODIURIL) 12.5 MG tablet Take 3.125 mg by mouth daily.  (Patient not taking: Reported on 11/06/2022)   ibuprofen (ADVIL) 200 MG tablet Take 600 mg by mouth every 6 (six) hours as needed for moderate pain.   LORazepam (ATIVAN) 1 MG tablet Take 1 tablet by mouth twice daily as needed for anxiety   Multiple Vitamin (MULTIVITAMIN WITH MINERALS) TABS Take 1 tablet by mouth daily.   ondansetron (ZOFRAN)  4 MG tablet Take 1 tablet (4 mg total) by mouth every 6 (six) hours.   oxyCODONE-acetaminophen (PERCOCET) 10-325 MG tablet Take 1 tablet by mouth every 6 (six) hours as needed for pain.   Probiotic Product (PROBIOTIC PO) Take 1 capsule by mouth daily.   propranolol (INDERAL) 20 MG tablet 20 mg in the morning and at bedtime. (Patient not taking: Reported on 06/19/2023)   psyllium (METAMUCIL)  58.6 % packet Take 1 packet by mouth every other day.   sildenafil (VIAGRA) 100 MG tablet Take 100 mg by mouth daily as needed for erectile dysfunction.   SUMAtriptan (IMITREX) 50 MG tablet Take 1 tablet (50 mg total) by mouth every 2 (two) hours as needed. May repeat in 2 hours if headache persists or recurs.   tamsulosin (FLOMAX) 0.4 MG CAPS capsule Take 1 capsule (0.4 mg total) by mouth daily. (Patient not taking: Reported on 11/06/2022)   telmisartan (MICARDIS) 80 MG tablet Take 80 mg by mouth at bedtime.   traZODone (DESYREL) 50 MG tablet Take 1 tablet (50 mg total) by mouth at bedtime.   [DISCONTINUED] simethicone (MYLICON) 80 MG chewable tablet Chew 80 mg by mouth every 6 (six) hours as needed for flatulence.   No facility-administered encounter medications on file as of 08/18/2023.    No results found for this or any previous visit (from the past 2160 hour(s)).   Psychiatric Specialty Exam: Physical Exam  Review of Systems  Weight 200 lb (90.7 kg).There is no height or weight on file to calculate BMI.  General Appearance: Casual  Eye Contact:  Good  Speech:  Clear and Coherent  Volume:  Normal  Mood:  Anxious  Affect:  Appropriate  Thought Process:  Goal Directed  Orientation:  Full (Time, Place, and Person)  Thought Content:  Rumination  Suicidal Thoughts:  No  Homicidal Thoughts:  No  Memory:  Immediate;   Good Recent;   Good Remote;   Good  Judgement:  Intact  Insight:  Shallow  Psychomotor Activity:  Tremor  Concentration:  Concentration: Good and Attention Span: Good  Recall:  Good  Fund of Knowledge:  Good  Language:  Good  Akathisia:  No  Handed:  Right  AIMS (if indicated):     Assets:  Communication Skills Desire for Improvement Housing Social Support Transportation  ADL's:  Intact  Cognition:  WNL  Sleep:  better     Assessment/Plan: Generalized anxiety disorder - Plan: escitalopram (LEXAPRO) 5 MG tablet, traZODone (DESYREL) 50 MG tablet,  LORazepam (ATIVAN) 1 MG tablet  Panic attack - Plan: escitalopram (LEXAPRO) 5 MG tablet, LORazepam (ATIVAN) 1 MG tablet  PTSD (post-traumatic stress disorder) - Plan: escitalopram (LEXAPRO) 5 MG tablet  Reassurance given.  Recommend to keep the Lexapro 2.5 mg as higher dose because more side effects.  So far his anxiety is somewhat better and he is sleeping better.  His appetite is okay.  He reported he still has irritability brain fog which he believes could be due to previous medication side effects.  He still have shakes and nervous but that is chronic.  Patient agreed with the plan.  Continue Lexapro 2.5 mg for now, Ativan 1 mg twice a day and trazodone 50 mg at bedtime.  Encouraged to keep appointment with therapist when therapist is available.  Recommend to call us back if is any question or any concern.  Follow-up in 3 months   Follow Up Instructions:     I discussed the assessment and treatment plan  with the patient. The patient was provided an opportunity to ask questions and all were answered. The patient agreed with the plan and demonstrated an understanding of the instructions.   The patient was advised to call back or seek an in-person evaluation if the symptoms worsen or if the condition fails to improve as anticipated.    Collaboration of Care: Other provider involved in patient's care AEB notes are available in epic to review  Patient/Guardian was advised Release of Information must be obtained prior to any record release in order to collaborate their care with an outside provider. Patient/Guardian was advised if they have not already done so to contact the registration department to sign all necessary forms in order for [name] to release information regarding their care.   Consent: Patient/Guardian gives verbal consent for treatment and assignment of benefits for services provided during this visit. Patient/Guardian expressed understanding and agreed to proceed.     I provided  22 minutes of non face to face time during this encounter.  Note: This document was prepared by [employer] voice dictation technology and any errors that results from this process are unintentional.    Cleotis Nipper, MD 08/18/2023

## 2023-08-26 ENCOUNTER — Ambulatory Visit (HOSPITAL_COMMUNITY): Payer: Medicare HMO | Admitting: Licensed Clinical Social Worker

## 2023-09-01 ENCOUNTER — Telehealth (HOSPITAL_COMMUNITY): Payer: Medicare HMO | Admitting: Psychiatry

## 2023-09-03 DIAGNOSIS — E538 Deficiency of other specified B group vitamins: Secondary | ICD-10-CM | POA: Diagnosis not present

## 2023-09-22 ENCOUNTER — Ambulatory Visit (INDEPENDENT_AMBULATORY_CARE_PROVIDER_SITE_OTHER): Payer: Medicare HMO | Admitting: Licensed Clinical Social Worker

## 2023-09-22 DIAGNOSIS — F411 Generalized anxiety disorder: Secondary | ICD-10-CM

## 2023-09-23 ENCOUNTER — Encounter (HOSPITAL_COMMUNITY): Payer: Self-pay | Admitting: Licensed Clinical Social Worker

## 2023-09-23 NOTE — Progress Notes (Signed)
This is Mr. Sensing's 3rd late cancellation in a row. Letter sent to discharge from therapist. He will have to attend Dr. Sheela Stack next appointment in order to remain his patient.

## 2023-09-29 DIAGNOSIS — E538 Deficiency of other specified B group vitamins: Secondary | ICD-10-CM | POA: Diagnosis not present

## 2023-09-29 DIAGNOSIS — Z23 Encounter for immunization: Secondary | ICD-10-CM | POA: Diagnosis not present

## 2023-10-27 DIAGNOSIS — E538 Deficiency of other specified B group vitamins: Secondary | ICD-10-CM | POA: Diagnosis not present

## 2023-10-28 ENCOUNTER — Other Ambulatory Visit: Payer: Self-pay

## 2023-10-28 ENCOUNTER — Emergency Department (HOSPITAL_BASED_OUTPATIENT_CLINIC_OR_DEPARTMENT_OTHER): Payer: Medicare HMO | Admitting: Radiology

## 2023-10-28 ENCOUNTER — Emergency Department (HOSPITAL_BASED_OUTPATIENT_CLINIC_OR_DEPARTMENT_OTHER)
Admission: EM | Admit: 2023-10-28 | Discharge: 2023-10-28 | Disposition: A | Discharge status: Home / Self Care | Payer: Medicare HMO | Attending: Emergency Medicine | Admitting: Emergency Medicine

## 2023-10-28 ENCOUNTER — Emergency Department (HOSPITAL_BASED_OUTPATIENT_CLINIC_OR_DEPARTMENT_OTHER): Payer: Medicare HMO

## 2023-10-28 ENCOUNTER — Encounter (HOSPITAL_BASED_OUTPATIENT_CLINIC_OR_DEPARTMENT_OTHER): Payer: Self-pay

## 2023-10-28 DIAGNOSIS — W010XXA Fall on same level from slipping, tripping and stumbling without subsequent striking against object, initial encounter: Secondary | ICD-10-CM | POA: Insufficient documentation

## 2023-10-28 DIAGNOSIS — S3992XA Unspecified injury of lower back, initial encounter: Secondary | ICD-10-CM | POA: Diagnosis not present

## 2023-10-28 DIAGNOSIS — R0789 Other chest pain: Secondary | ICD-10-CM | POA: Diagnosis not present

## 2023-10-28 DIAGNOSIS — M549 Dorsalgia, unspecified: Secondary | ICD-10-CM | POA: Diagnosis not present

## 2023-10-28 DIAGNOSIS — I10 Essential (primary) hypertension: Secondary | ICD-10-CM | POA: Diagnosis not present

## 2023-10-28 DIAGNOSIS — R079 Chest pain, unspecified: Secondary | ICD-10-CM | POA: Diagnosis not present

## 2023-10-28 DIAGNOSIS — S34109A Unspecified injury to unspecified level of lumbar spinal cord, initial encounter: Secondary | ICD-10-CM | POA: Insufficient documentation

## 2023-10-28 DIAGNOSIS — Z79899 Other long term (current) drug therapy: Secondary | ICD-10-CM | POA: Diagnosis not present

## 2023-10-28 DIAGNOSIS — Z981 Arthrodesis status: Secondary | ICD-10-CM | POA: Diagnosis not present

## 2023-10-28 DIAGNOSIS — S39012A Strain of muscle, fascia and tendon of lower back, initial encounter: Secondary | ICD-10-CM

## 2023-10-28 DIAGNOSIS — M47816 Spondylosis without myelopathy or radiculopathy, lumbar region: Secondary | ICD-10-CM | POA: Diagnosis not present

## 2023-10-28 DIAGNOSIS — M545 Low back pain, unspecified: Secondary | ICD-10-CM | POA: Diagnosis not present

## 2023-10-28 LAB — TROPONIN I (HIGH SENSITIVITY): Troponin I (High Sensitivity): 5 ng/L (ref <18)

## 2023-10-28 LAB — CBC
HCT: 39 % (ref 39.0–52.0)
Hemoglobin: 12.7 g/dL — ABNORMAL LOW (ref 13.0–17.0)
MCH: 27.6 pg (ref 26.0–34.0)
MCHC: 32.6 g/dL (ref 30.0–36.0)
MCV: 84.8 fL (ref 80.0–100.0)
Platelets: 251 10*3/uL (ref 150–400)
RBC: 4.6 MIL/uL (ref 4.22–5.81)
RDW: 14.3 % (ref 11.5–15.5)
WBC: 6.7 10*3/uL (ref 4.0–10.5)
nRBC: 0 % (ref 0.0–0.2)

## 2023-10-28 LAB — BASIC METABOLIC PANEL
Anion gap: 10 (ref 5–15)
BUN: 7 mg/dL — ABNORMAL LOW (ref 8–23)
CO2: 22 mmol/L (ref 22–32)
Calcium: 9 mg/dL (ref 8.9–10.3)
Chloride: 108 mmol/L (ref 98–111)
Creatinine, Ser: 0.75 mg/dL (ref 0.61–1.24)
GFR, Estimated: >60 mL/min (ref >60)
Glucose, Bld: 112 mg/dL — ABNORMAL HIGH (ref 70–99)
Potassium: 3.5 mmol/L (ref 3.5–5.1)
Sodium: 140 mmol/L (ref 135–145)

## 2023-10-28 MED ORDER — CELECOXIB 200 MG PO CAPS: 200.0000 mg | ORAL_CAPSULE | Freq: Two times a day (BID) | ORAL | 0 refills | Status: DC

## 2023-10-28 MED ORDER — CYCLOBENZAPRINE HCL 10 MG PO TABS: 5.0000 mg | ORAL_TABLET | Freq: Two times a day (BID) | ORAL | 0 refills | Status: AC | PRN

## 2023-10-28 MED ORDER — KETOROLAC TROMETHAMINE 60 MG/2ML IM SOLN
30.0000 mg | Freq: Once | INTRAMUSCULAR | Status: AC
  Administered 2023-10-28: 30 mg via INTRAMUSCULAR
  Filled 2023-10-28: qty 2

## 2023-10-28 MED ORDER — KETOROLAC TROMETHAMINE 60 MG/2ML IM SOLN: 60.0000 mg | Freq: Once | INTRAMUSCULAR | Status: DC

## 2023-10-28 MED ORDER — DIAZEPAM 2 MG PO TABS
2.0000 mg | ORAL_TABLET | Freq: Once | ORAL | Status: AC
  Administered 2023-10-28: 2 mg via ORAL
  Filled 2023-10-28: qty 1

## 2023-10-28 MED ORDER — OXYCODONE-ACETAMINOPHEN 5-325 MG PO TABS
1.0000 | ORAL_TABLET | Freq: Once | ORAL | Status: AC
Start: 2023-10-28 — End: 2023-10-28
  Administered 2023-10-28: 1 via ORAL
  Filled 2023-10-28: qty 1

## 2023-10-28 NOTE — Discharge Instructions (Signed)
 SEEK IMMEDIATE MEDICAL ATTENTION IF: New numbness, tingling, weakness, or problem with the use of your arms or legs.  Severe back pain not relieved with medications.  Change in bowel or bladder control.  Increasing pain in any areas of the body (such as chest or abdominal pain).  Shortness of breath, dizziness or fainting.  Nausea (feeling sick to your stomach), vomiting, fever, or sweats.

## 2023-10-28 NOTE — ED Notes (Signed)
 Pt refused discharge vital signs

## 2023-10-28 NOTE — ED Triage Notes (Signed)
 BIB [facility], pt was at [facility], pulling tv across floor, pt reports tv started to fall, he tried to catch it, and heard a 'pop' in his back and 'back has been out since'. Pain across mid, lower back, worse with movement.   Pt started experiencing chest pain while in lobby, left sided. Pt in NAD.

## 2023-10-28 NOTE — ED Provider Notes (Signed)
  EMERGENCY DEPARTMENT AT [facility] Provider Note   CSN: [id number] Arrival date & time: 10/28/23  1217     History  Chief Complaint  Patient presents with   Back Pain    [name] [name]. is a 63 y.o. male.  Presents emergency department chief complaint of acute back injury.  Patient was at [facility] today.  He was looking at a television and slipped forward when it suddenly fell.  He grabbed it to keep it from falling and was pulled rapidly toward the floor in a squatting position.  He felt something pop in his back and has had immediate severe pain in his back, near inability to walk due to pain, he has pain radiating into his groin but denies numbness tingling bowel or bladder incontinence.  He cannot find a comfortable position to walk-in.  His pain is worse with any movement or change in position.  He has a history of previous surgery to his lumbar spine with Dr. Gillie.  He denies weakness at the ankle or foot drop.  Patient states he had a little bit of chest pain when he came and   Back Pain      Home Medications Prior to Admission medications   Medication Sig Start Date End Date Taking? Authorizing Provider  amLODipine  (NORVASC ) 10 MG tablet Take 10 mg by mouth daily.  03/11/19   [provider]  betamethasone  dipropionate 0.05 % cream Apply 1 application topically 2 (two) times daily as needed (rash). Patient not taking: Reported on 09/24/2022 01/04/20   [provider]  cyclobenzaprine  (FLEXERIL ) 10 MG tablet Take 0.5-1 tablets (5-10 mg total) by mouth 3 (three) times daily as needed for muscle spasms. 01/13/20   Cheryle Debby LABOR, MD  escitalopram  (LEXAPRO ) 5 MG tablet Take 0.5 tablets (2.5 mg total) by mouth daily. 08/18/23 12/16/23  Arfeen, Leni DASEN, MD  famotidine  (PEPCID ) 40 MG tablet Take 20 mg by mouth 2 (two) times daily.    [provider]  hydrochlorothiazide  (HYDRODIURIL ) 12.5 MG tablet Take 3.125 mg by mouth daily.   Patient not taking: Reported on 11/06/2022 09/01/19   [provider]  ibuprofen  (ADVIL ) 200 MG tablet Take 600 mg by mouth every 6 (six) hours as needed for moderate pain.    [provider]  LORazepam  (ATIVAN ) 1 MG tablet Take 1 tablet (1 mg total) by mouth 2 (two) times daily as needed. for anxiety 08/18/23   Arfeen, Leni DASEN, MD  Multiple Vitamin (MULTIVITAMIN WITH MINERALS) TABS Take 1 tablet by mouth daily.    [provider]  ondansetron  (ZOFRAN ) 4 MG tablet Take 1 tablet (4 mg total) by mouth every 6 (six) hours. 10/16/22   Lenor Hollering, MD  oxyCODONE -acetaminophen  (PERCOCET) 10-325 MG tablet Take 1 tablet by mouth every 6 (six) hours as needed for pain.    [provider]  Probiotic Product (PROBIOTIC PO) Take 1 capsule by mouth daily.    [provider]  propranolol (INDERAL) 20 MG tablet 20 mg in the morning and at bedtime. Patient not taking: Reported on 06/19/2023 09/02/21   Koirala, Dibas, MD  psyllium (METAMUCIL) 58.6 % packet Take 1 packet by mouth every other day.    [provider]  sildenafil (VIAGRA) 100 MG tablet Take 100 mg by mouth daily as needed for erectile dysfunction.    [provider]  SUMAtriptan  (IMITREX ) 50 MG tablet Take 1 tablet (50 mg total) by mouth every 2 (two) hours as needed.  May repeat in 2 hours if headache persists or recurs. 01/01/23   Onita Duos, MD  tamsulosin  (FLOMAX ) 0.4 MG CAPS capsule Take 1 capsule (0.4 mg total) by mouth daily. Patient not taking: Reported on 11/06/2022 10/16/22   Lenor Hollering, MD  telmisartan (MICARDIS) 80 MG tablet Take 80 mg by mouth at bedtime.    [provider]  traZODone  (DESYREL ) 50 MG tablet Take 1 tablet (50 mg total) by mouth at bedtime. 08/18/23   Arfeen, Leni DASEN, MD  simethicone  (MYLICON) 80 MG chewable tablet Chew 80 mg by mouth every 6 (six) hours as needed for flatulence.  02/01/14  [provider]      Allergies    Chantix [varenicline],  Morphine , and Penicillins    Review of Systems   Review of Systems  Musculoskeletal:  Positive for back pain.    Physical Exam Updated Vital Signs BP (!) 141/89   Pulse 89   Temp 98.1 F (36.7 C) (Temporal)   Resp 18   Ht 5' 11 (1.803 m)   Wt 89.8 kg   SpO2 100%   BMI 27.62 kg/m  Physical Exam Vitals and nursing note reviewed.  Constitutional:      General: He is not in acute distress.    Appearance: He is well-developed. He is not diaphoretic.  HENT:     Head: Normocephalic and atraumatic.  Eyes:     General: No scleral icterus.    Conjunctiva/sclera: Conjunctivae normal.  Cardiovascular:     Rate and Rhythm: Normal rate and regular rhythm.     Heart sounds: Normal heart sounds.  Pulmonary:     Effort: Pulmonary effort is normal. No respiratory distress.     Breath sounds: Normal breath sounds.  Abdominal:     Palpations: Abdomen is soft.     Tenderness: There is no abdominal tenderness.  Musculoskeletal:     Cervical back: Normal range of motion and neck supple.     Comments: Appears very uncomfortable.  He is examined in a resting position.  He has tenderness over the sacrum.  He has no palpable spasm.  Normal strength in the bilateral lower extremities with hip flexion extension abduction and adduction.  Normal strength with flexion extension at the knee and dorsi and plantarflexion at the ankle.  Neurovascularly intact with symmetric sensation.  Motion is significantly reduced due to severity of pain at this time.  Skin:    General: Skin is warm and dry.  Neurological:     Mental Status: He is alert.  Psychiatric:        Behavior: Behavior normal.   ED Results / Procedures / Treatments   Labs (all labs ordered are listed, but only abnormal results are displayed) Labs Reviewed  BASIC METABOLIC PANEL - Abnormal; Notable for the following components:      Result Value   Glucose, Bld 112 (*)    BUN 7 (*)    All other components within normal limits  CBC -  Abnormal; Notable for the following components:   Hemoglobin 12.7 (*)    All other components within normal limits  TROPONIN I (HIGH SENSITIVITY)  TROPONIN I (HIGH SENSITIVITY)    EKG None  Radiology DG Chest 2 View Result Date: 10/28/2023 CLINICAL DATA:  Chest pain. EXAM: CHEST - 2 VIEW COMPARISON:  06/03/2021 FINDINGS: Lungs are adequately inflated without focal airspace consolidation or effusion. Cardiomediastinal silhouette is normal. Partially visualized fusion hardware over the cervical spine. Mild degenerative change of the spine. Surgical  clips over the right upper quadrant. IMPRESSION: No active cardiopulmonary disease. Electronically Signed   By: [name] [name] M.D.   On: 10/28/2023 13:54    Procedures Procedures    Medications Ordered in ED Medications  oxyCODONE -acetaminophen  (PERCOCET/ROXICET) 5-325 MG per tablet 1 tablet (1 tablet Oral Given 10/28/23 1608)  diazepam  (VALIUM ) tablet 2 mg (2 mg Oral Given 10/28/23 1608)  ketorolac  (TORADOL ) injection 30 mg (30 mg Intramuscular Given 10/28/23 1609)    ED Course/ Medical Decision Making/ A&P                                 Medical Decision Making Here for lumbar spine injury, patient mention chest pain at the time he was evaluated which was fleeting.  No concern for ACS based on his initial complaint and his findings. Patient also with lumbar spine injury.  No evidence of acute fracture, spondylolisthesis, disc space narrowing.  Patient pain improved here in the emergency department will discharge with Celebrex  and Flexeril .  Discussed outpatient follow-up and return precautions, no radiculopathy or red flag symptoms.  Amount and/or Complexity of Data Reviewed Labs: ordered.    Details: Labs reviewed, no acute findings Radiology: ordered and independent interpretation performed.    Details: [facility] interpreted chest x-ray and CT lumbar spine, no acute findings ECG/medicine tests: independent interpretation performed.     Details: Generalized interpreted EKG, no evidence of acute ischemia.  Risk Prescription drug management.           Final Clinical Impression(s) / ED Diagnoses Final diagnoses:  None    Rx / DC Orders ED Discharge Orders     None         Arloa Chroman, PA-C 10/28/23 1736    Geraldene Hamilton, MD 10/30/23 2328

## 2023-10-28 NOTE — ED Notes (Signed)
 Patient transported to CT

## 2023-11-05 DIAGNOSIS — G894 Chronic pain syndrome: Secondary | ICD-10-CM | POA: Diagnosis not present

## 2023-11-05 DIAGNOSIS — F1120 Opioid dependence, uncomplicated: Secondary | ICD-10-CM | POA: Diagnosis not present

## 2023-11-05 DIAGNOSIS — M5416 Radiculopathy, lumbar region: Secondary | ICD-10-CM | POA: Diagnosis not present

## 2023-11-08 ENCOUNTER — Other Ambulatory Visit (HOSPITAL_COMMUNITY): Payer: Self-pay | Admitting: Psychiatry

## 2023-11-08 DIAGNOSIS — F411 Generalized anxiety disorder: Secondary | ICD-10-CM

## 2023-11-17 ENCOUNTER — Encounter (HOSPITAL_COMMUNITY): Payer: Self-pay | Admitting: Psychiatry

## 2023-11-17 ENCOUNTER — Telehealth (HOSPITAL_BASED_OUTPATIENT_CLINIC_OR_DEPARTMENT_OTHER): Payer: Medicare HMO | Admitting: Psychiatry

## 2023-11-17 VITALS — Wt 198.0 lb

## 2023-11-17 DIAGNOSIS — F410 Panic disorder [episodic paroxysmal anxiety] without agoraphobia: Secondary | ICD-10-CM | POA: Diagnosis not present

## 2023-11-17 DIAGNOSIS — F411 Generalized anxiety disorder: Secondary | ICD-10-CM

## 2023-11-17 DIAGNOSIS — F4310 Post-traumatic stress disorder, unspecified: Secondary | ICD-10-CM | POA: Diagnosis not present

## 2023-11-17 MED ORDER — TRAZODONE HCL 50 MG PO TABS: 25.0000 mg | ORAL_TABLET | Freq: Every day | ORAL | 1 refills | Status: DC

## 2023-11-17 MED ORDER — LORAZEPAM 1 MG PO TABS: 1.0000 mg | ORAL_TABLET | Freq: Two times a day (BID) | ORAL | 2 refills | Status: DC | PRN

## 2023-11-17 MED ORDER — ESCITALOPRAM OXALATE 5 MG PO TABS: 2.5000 mg | ORAL_TABLET | Freq: Every day | ORAL | 1 refills | Status: DC

## 2023-11-17 NOTE — Progress Notes (Signed)
[facility] MD Virtual Progress Note   Patient Location: Home Provider Location: Home Office  I connect with patient by video and verified that I am speaking with correct person by using two identifiers. I discussed the limitations of evaluation and management by telemedicine and the availability of in person appointments. I also discussed with the patient that there may be a patient responsible charge related to this service. The patient expressed understanding and agreed to proceed.  [name] Sabet Sr. [id number] 63 y.o.  11/17/2023 10:30 AM  History of Present Illness:  Patient is evaluated by video session.  He reported his dog died 2 weeks ago and he was very sad but now he is feeling better.  He continues to take Lexapro 2.5 mg, Ativan 1 mg twice a day and trazodone 25 mg at bedtime.  He reported since dosage reduced his tremors are much better.  He realized he need to learn how to handle the chronic anxiety.  He reported his brain fog is much better but is still there but he does not worries as much as he used to.  He enjoyed Christmas with the family.  His sleep is interrupted but able to get 6 to 7 hours every night.  He was seeing therapist but he missed the appointment and he do not have any more appointments.  Denies any paranoia, hallucination, suicidal thoughts.  He denies any dizziness.  We have discussed polypharmacy and medication side effects and he realized that low-dose medicine may work better for him even though he has some anxiety which he can able to control by himself.  His nightmares and flashbacks are not as intense.  He denies any major panic attack.  Recently had blood work and he was seen in the emergency room for back pain.  He is taking narcotics and Flexeril.  He is aware that he cannot mix these medication with psychotropic medication as he taking Ativan.  He denies drinking or using any illegal substances.  His appetite is okay.  His weight is  stable.  Past Psychiatric History: H/O anxiety since early 58s.  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  No h/o mania, psychosis, suicidal attempt, inpatient treatment.  Seen and managed by PCP since early 30s. Tried Remeron, hydroxyzine, gabapentin, Paxil, Prozac, Zoloft, Cymbalta, Klonopin and Lamictal.  Trintellix helped but in the emergency room it was discontinued as patient complaining of tremors.  Pristiq could not tolerate more than 25 mg.  Viibryd cause diarrhea. Gene-Sight shows Cymbalta, mirtazapine, Paxil, Luvox, clozapine, olanzapine, thiothixene has significant drug interaction.  Since he stopped Viibryd his appetite is coming back and he had gained a few pounds.    Outpatient Encounter Medications as of 11/17/2023  Medication Sig   amLODipine (NORVASC) 10 MG tablet Take 10 mg by mouth daily.    betamethasone dipropionate 0.05 % cream Apply 1 application topically 2 (two) times daily as needed (rash). (Patient not taking: Reported on 09/24/2022)   celecoxib (CELEBREX) 200 MG capsule Take 1 capsule (200 mg total) by mouth 2 (two) times daily.   cyclobenzaprine (FLEXERIL) 10 MG tablet Take 0.5-1 tablets (5-10 mg total) by mouth 3 (three) times daily as needed for muscle spasms.   cyclobenzaprine (FLEXERIL) 10 MG tablet Take 0.5-1 tablets (5-10 mg total) by mouth 2 (two) times daily as needed for muscle spasms.   escitalopram (LEXAPRO) 5 MG tablet Take 0.5 tablets (2.5 mg total) by mouth daily.   famotidine (  PEPCID) 40 MG tablet Take 20 mg by mouth 2 (two) times daily.   hydrochlorothiazide (HYDRODIURIL) 12.5 MG tablet Take 3.125 mg by mouth daily.  (Patient not taking: Reported on 11/06/2022)   ibuprofen (ADVIL) 200 MG tablet Take 600 mg by mouth every 6 (six) hours as needed for moderate pain.   LORazepam (ATIVAN) 1 MG tablet Take 1 tablet (1 mg total) by mouth 2 (two) times daily as needed. for anxiety   Multiple Vitamin (MULTIVITAMIN WITH MINERALS) TABS Take 1  tablet by mouth daily.   ondansetron (ZOFRAN) 4 MG tablet Take 1 tablet (4 mg total) by mouth every 6 (six) hours.   oxyCODONE-acetaminophen (PERCOCET) 10-325 MG tablet Take 1 tablet by mouth every 6 (six) hours as needed for pain.   Probiotic Product (PROBIOTIC PO) Take 1 capsule by mouth daily.   propranolol (INDERAL) 20 MG tablet 20 mg in the morning and at bedtime. (Patient not taking: Reported on 06/19/2023)   psyllium (METAMUCIL) 58.6 % packet Take 1 packet by mouth every other day.   sildenafil (VIAGRA) 100 MG tablet Take 100 mg by mouth daily as needed for erectile dysfunction.   SUMAtriptan (IMITREX) 50 MG tablet Take 1 tablet (50 mg total) by mouth every 2 (two) hours as needed. May repeat in 2 hours if headache persists or recurs.   tamsulosin (FLOMAX) 0.4 MG CAPS capsule Take 1 capsule (0.4 mg total) by mouth daily. (Patient not taking: Reported on 11/06/2022)   telmisartan (MICARDIS) 80 MG tablet Take 80 mg by mouth at bedtime.   traZODone (DESYREL) 50 MG tablet Take 1 tablet (50 mg total) by mouth at bedtime.   [DISCONTINUED] simethicone (MYLICON) 80 MG chewable tablet Chew 80 mg by mouth every 6 (six) hours as needed for flatulence.   No facility-administered encounter medications on file as of 11/17/2023.    Recent Results (from the past 2160 hours)  Basic metabolic panel     Status: Abnormal   Collection Time: 10/28/23 12:40 PM  Result Value Ref Range   Sodium 140 135 - 145 mmol/L   Potassium 3.5 3.5 - 5.1 mmol/L   Chloride 108 98 - 111 mmol/L   CO2 22 22 - 32 mmol/L   Glucose, Bld 112 (H) 70 - 99 mg/dL    Comment: Glucose reference range applies only to samples taken after fasting for at least 8 hours.   BUN 7 (L) 8 - 23 mg/dL   Creatinine, Ser 1.61 0.61 - 1.24 mg/dL   Calcium 9.0 8.9 - 09.6 mg/dL   GFR, Estimated >04 >54 mL/min    Comment: (NOTE) Calculated using the CKD-EPI Creatinine Equation (2021)    Anion gap 10 5 - 15    Comment: Performed at [facility], [address] [place], [place] [address]  CBC     Status: Abnormal   Collection Time: 10/28/23 12:40 PM  Result Value Ref Range   WBC 6.7 4.0 - 10.5 K/uL   RBC 4.60 4.22 - 5.81 MIL/uL   Hemoglobin 12.7 (L) 13.0 - 17.0 g/dL   HCT 91.4 78.2 - 95.6 %   MCV 84.8 80.0 - 100.0 fL   MCH 27.6 26.0 - 34.0 pg   MCHC 32.6 30.0 - 36.0 g/dL   RDW 21.3 08.6 - 57.8 %   Platelets 251 150 - 400 K/uL   nRBC 0.0 0.0 - 0.2 %    Comment: Performed at [facility], [address] [place], [place] [address]  Troponin I (High  Sensitivity)     Status: None   Collection Time: 10/28/23 12:40 PM  Result Value Ref Range   Troponin I (High Sensitivity) 5 <18 ng/L    Comment: (NOTE) Elevated high sensitivity troponin I (hsTnI) values and significant  changes across serial measurements may suggest ACS but many other  chronic and acute conditions are known to elevate hsTnI results.  Refer to the "Links" section for chest pain algorithms and additional  guidance. Performed at [facility], [address] [place], [place] [address]      Psychiatric Specialty Exam: Physical Exam  Review of Systems  Weight 198 lb (89.8 kg).There is no height or weight on file to calculate BMI.  General Appearance: Casual  Eye Contact:  Good  Speech:  Clear and Coherent  Volume:  Normal  Mood:  Anxious  Affect:  Appropriate  Thought Process:  Goal Directed  Orientation:  Full (Time, Place, and Person)  Thought Content:  Logical  Suicidal Thoughts:  No  Homicidal Thoughts:  No  Memory:  Immediate;   Good Recent;   Good Remote;   Good  Judgement:  Good  Insight:  Present  Psychomotor Activity:  Normal  Concentration:  Concentration: Good and Attention Span: Good  Recall:  Good  Fund of Knowledge:  Good  Language:  Good  Akathisia:  No  Handed:  Right  AIMS (if indicated):     Assets:  Communication Skills Desire for Improvement Financial  Resources/Insurance Housing Social Support Transportation  ADL's:  Intact  Cognition:  WNL  Sleep:  better     Assessment/Plan: Generalized anxiety disorder - Plan: escitalopram (LEXAPRO) 5 MG tablet, traZODone (DESYREL) 50 MG tablet, LORazepam (ATIVAN) 1 MG tablet  Panic attack - Plan: escitalopram (LEXAPRO) 5 MG tablet, LORazepam (ATIVAN) 1 MG tablet  PTSD (post-traumatic stress disorder) - Plan: escitalopram (LEXAPRO) 5 MG tablet  I reviewed blood work results, notes from the recent emergency room visit for his back pain.  Discussed in length about taking multiple medication which includes benzodiazepine and narcotics.  We had tried in the past cutting down his benzo but his anxiety get worse.  I like to keep the lower doses which I agreed as symptoms are chronic but stable.  I encouraged to reconsider seeing a therapist when he like to get appointment with Shanda Bumps which helped him a lot.  We will continue Lexapro 2.5 mg daily, Ativan 1 mg twice a day and trazodone 25 mg at bedtime.  Encourage using coping skills to help his anxiety and nervousness.  In the past he has taken higher doses and causes tremors and shakes.  We will follow-up in 3 months.   Follow Up Instructions:     I discussed the assessment and treatment plan with the patient. The patient was provided an opportunity to ask questions and all were answered. The patient agreed with the plan and demonstrated an understanding of the instructions.   The patient was advised to call back or seek an in-person evaluation if the symptoms worsen or if the condition fails to improve as anticipated.    Collaboration of Care: Other provider involved in patient's care AEB notes are available in epic to review  Patient/Guardian was advised Release of Information must be obtained prior to any record release in order to collaborate their care with an outside provider. Patient/Guardian was advised if they have not already done so to  contact the registration department to sign all necessary forms in order for [name] to  release information regarding their care.   Consent: Patient/Guardian gives verbal consent for treatment and assignment of benefits for services provided during this visit. Patient/Guardian expressed understanding and agreed to proceed.     I provided 25 minutes of non face to face time during this encounter.  Note: This document was prepared by [employer] voice dictation technology and any errors that results from this process are unintentional.    Cleotis Nipper, MD 11/17/2023

## 2023-11-18 ENCOUNTER — Telehealth (HOSPITAL_COMMUNITY): Payer: Self-pay

## 2023-11-18 NOTE — Telephone Encounter (Signed)
Prior auth submitted on cover my meds today for Lorazepam 1 mg

## 2023-11-25 ENCOUNTER — Encounter (HOSPITAL_COMMUNITY): Payer: Self-pay | Admitting: Licensed Clinical Social Worker

## 2023-11-25 ENCOUNTER — Ambulatory Visit (HOSPITAL_COMMUNITY): Payer: Medicare HMO | Admitting: Licensed Clinical Social Worker

## 2023-11-25 DIAGNOSIS — F4310 Post-traumatic stress disorder, unspecified: Secondary | ICD-10-CM | POA: Diagnosis not present

## 2023-11-25 NOTE — Progress Notes (Signed)
Virtual Visit via Video Note  I connected with [name] [name] Sr. on 11/25/23 at  4:30 PM EST by a video enabled telemedicine application and verified that I am speaking with the correct person using two identifiers.  Location: Patient: home Provider: home office   I discussed the limitations of evaluation and management by telemedicine and the availability of in person appointments. The patient expressed understanding and agreed to proceed.   I discussed the assessment and treatment plan with the patient. The patient was provided an opportunity to ask questions and all were answered. The patient agreed with the plan and demonstrated an understanding of the instructions.   The patient was advised to call back or seek an in-person evaluation if the symptoms worsen or if the condition fails to improve as anticipated.  I provided 55 minutes of non-face-to-face time during this encounter.   [name] Melter, LCSW   THERAPIST PROGRESS NOTE  Session Time: 4:30pm-5:25pm  Participation Level: Active  Behavioral Response: Well GroomedAlertAnxious and Euthymic  Type of Therapy: Individual Therapy  Treatment Goals addressed:       Goal: LTG: [name] [name] will score less than 5 on the Generalized Anxiety Disorder 7 Scale (GAD-7)               Dates: Start:  06/17/23    Expected End:  12/18/23         Disciplines: Interdisciplinary, PROVIDER                                   Goal: STG: [name] [name] will participate in at least 80% of scheduled individual psychotherapy sessions                 Dates: Start:  06/17/23    Expected End:  12/18/23         Disciplines: Interdisciplinary, PROVIDER        ProgressTowards Goals: Progressing  Interventions: CBT  Summary: [name] [name] Sr. is a 63 y.o. male who presents with PTSD.   Suicidal/Homicidal: Nowithout intent/plan  Therapist Response: [name] [name] engaged well in individual virtual session with Facilities manager.  Clinician utilized CBT to  process thoughts feelings and interactions.  Clinician identified recent anxiety episodes that present as intrusive thoughts.  Clinician explored time frame, frequency, and intensity.  Clinician encouraged [name] [name] to document or track moods and anxiety through the weeks in order to gather more information.  Clinician provided psychoeducation about intrusive thoughts as an aspect of generalized anxiety and PTSD.  Clinician discussed having a plan in order to treat and manage anxiety.  This plan includes: 1- "how am I feeling". 2- what do I need to feel better. 3- use coping skill. 4- return to the problem once calm.  [name] [name] shared that he does have a daily routine that helps him maintain stability and gives him a something to do during the day.  [name] [name] shared some intrusive thoughts about friends or family members dying and how he would cope if that were to happen.  Clinician validated these experiences especially due to recent experiences of friends and family members dying.  Clinician discussed the importance of allowing his brain to have peace and to use thought stopping skills as needed.  Plan: Return again in 2 weeks.  Diagnosis: PTSD (post-traumatic stress disorder)  Collaboration of Care: Psychiatrist AEB updated Dr. Lolly [name] that [name] [name] had returned to therapy.  Patient/Guardian was advised Release of Information must be  obtained prior to any record release in order to collaborate their care with an outside provider. Patient/Guardian was advised if they have not already done so to contact the registration department to sign all necessary forms in order for [name] to release information regarding their care.   Consent: Patient/Guardian gives verbal consent for treatment and assignment of benefits for services provided during this visit. Patient/Guardian expressed understanding and agreed to proceed.   [name] Heck [place], LCSW 11/25/2023

## 2023-11-26 DIAGNOSIS — E559 Vitamin D deficiency, unspecified: Secondary | ICD-10-CM | POA: Diagnosis not present

## 2023-11-26 DIAGNOSIS — E538 Deficiency of other specified B group vitamins: Secondary | ICD-10-CM | POA: Diagnosis not present

## 2023-11-26 DIAGNOSIS — R7301 Impaired fasting glucose: Secondary | ICD-10-CM | POA: Diagnosis not present

## 2023-11-26 DIAGNOSIS — Z79899 Other long term (current) drug therapy: Secondary | ICD-10-CM | POA: Diagnosis not present

## 2023-11-26 DIAGNOSIS — J439 Emphysema, unspecified: Secondary | ICD-10-CM | POA: Diagnosis not present

## 2023-11-26 DIAGNOSIS — R200 Anesthesia of skin: Secondary | ICD-10-CM | POA: Diagnosis not present

## 2023-11-26 DIAGNOSIS — R202 Paresthesia of skin: Secondary | ICD-10-CM | POA: Diagnosis not present

## 2023-11-26 DIAGNOSIS — E611 Iron deficiency: Secondary | ICD-10-CM | POA: Diagnosis not present

## 2023-11-26 DIAGNOSIS — K219 Gastro-esophageal reflux disease without esophagitis: Secondary | ICD-10-CM | POA: Diagnosis not present

## 2023-12-10 ENCOUNTER — Ambulatory Visit (HOSPITAL_COMMUNITY): Payer: Medicare HMO | Admitting: Licensed Clinical Social Worker

## 2023-12-10 DIAGNOSIS — F411 Generalized anxiety disorder: Secondary | ICD-10-CM | POA: Diagnosis not present

## 2023-12-10 DIAGNOSIS — F4310 Post-traumatic stress disorder, unspecified: Secondary | ICD-10-CM | POA: Diagnosis not present

## 2023-12-11 ENCOUNTER — Encounter (HOSPITAL_COMMUNITY): Payer: Self-pay | Admitting: Licensed Clinical Social Worker

## 2023-12-11 NOTE — Progress Notes (Signed)
Virtual Visit via Video Note  I connected with [name] Simpson Sr. on 12/10/23 at  2:30 PM EST by a video enabled telemedicine application and verified that I am speaking with the correct person using two identifiers.  Location: Patient: home Provider: home office   I discussed the limitations of evaluation and management by telemedicine and the availability of in person appointments. The patient expressed understanding and agreed to proceed.     I discussed the assessment and treatment plan with the patient. The patient was provided an opportunity to ask questions and all were answered. The patient agreed with the plan and demonstrated an understanding of the instructions.   The patient was advised to call back or seek an in-person evaluation if the symptoms worsen or if the condition fails to improve as anticipated.  I provided 55 minutes of non-face-to-face time during this encounter.   Veneda Melter, LCSW   THERAPIST PROGRESS NOTE  Session Time: 2:30pm=-3:25pm  Participation Level: Active  Behavioral Response: Well GroomedAlertAnxious and Euthymic  Type of Therapy: Individual Therapy  Treatment Goals addressed:           Goal: LTG: [name] [name] will score less than 5 on the Generalized Anxiety Disorder 7 Scale (GAD-7)               Dates: Start:  06/17/23    Expected End:  12/18/23         Disciplines: Interdisciplinary, PROVIDER                                       Goal: STG: [name] [name] will participate in at least 80% of scheduled individual psychotherapy sessions                 Dates: Start:  06/17/23    Expected End:  12/18/23         Disciplines: Interdisciplinary, PROVIDER          ProgressTowards Goals: Progressing  Interventions: CBT and Other: Mindfulness  Summary: [name] Graven Sr. is a 63 y.o. male who presents with PTSD and GAD.   Suicidal/Homicidal: Nowithout intent/plan  Therapist Response: [name] [name] engaged well in individual virtual session with  Facilities manager.  Clinician utilized CBT and mindfulness to process thoughts feelings and interactions.  Clinician explored anxiety levels and noted continual anxiety occurring at least 4 times a week.  Clinician discussed days and times for other triggers that may be present during anxiety symptoms.  Clinician discussed bedtime routine and identified value of progressive muscle relaxation.  Clinician explained how to use progressive muscle relaxation and provided links to videos on YouTube.  Clinician encouraged [name] [name] to practice this before bed and once during the day.  Plan: Return again in 2 weeks.  Diagnosis: PTSD (post-traumatic stress disorder)  Generalized anxiety disorder  Collaboration of Care: Patient refused AEB none required  Patient/Guardian was advised Release of Information must be obtained prior to any record release in order to collaborate their care with an outside provider. Patient/Guardian was advised if they have not already done so to contact the registration department to sign all necessary forms in order for [name] to release information regarding their care.   Consent: Patient/Guardian gives verbal consent for treatment and assignment of benefits for services provided during this visit. Patient/Guardian expressed understanding and agreed to proceed.   Chryl Heck [place], LCSW 12/11/2023

## 2023-12-15 DIAGNOSIS — M25562 Pain in left knee: Secondary | ICD-10-CM | POA: Diagnosis not present

## 2023-12-18 DIAGNOSIS — E538 Deficiency of other specified B group vitamins: Secondary | ICD-10-CM | POA: Diagnosis not present

## 2023-12-23 DIAGNOSIS — H52223 Regular astigmatism, bilateral: Secondary | ICD-10-CM | POA: Diagnosis not present

## 2023-12-23 DIAGNOSIS — H5203 Hypermetropia, bilateral: Secondary | ICD-10-CM | POA: Diagnosis not present

## 2023-12-23 DIAGNOSIS — Z135 Encounter for screening for eye and ear disorders: Secondary | ICD-10-CM | POA: Diagnosis not present

## 2023-12-24 ENCOUNTER — Ambulatory Visit (HOSPITAL_COMMUNITY): Payer: Medicare HMO | Admitting: Licensed Clinical Social Worker

## 2023-12-30 DIAGNOSIS — I10 Essential (primary) hypertension: Secondary | ICD-10-CM | POA: Diagnosis not present

## 2023-12-30 DIAGNOSIS — R42 Dizziness and giddiness: Secondary | ICD-10-CM | POA: Diagnosis not present

## 2023-12-31 ENCOUNTER — Other Ambulatory Visit (HOSPITAL_COMMUNITY): Payer: Self-pay

## 2023-12-31 DIAGNOSIS — F411 Generalized anxiety disorder: Secondary | ICD-10-CM

## 2023-12-31 DIAGNOSIS — F4310 Post-traumatic stress disorder, unspecified: Secondary | ICD-10-CM

## 2023-12-31 DIAGNOSIS — F410 Panic disorder [episodic paroxysmal anxiety] without agoraphobia: Secondary | ICD-10-CM

## 2023-12-31 DIAGNOSIS — R42 Dizziness and giddiness: Secondary | ICD-10-CM | POA: Diagnosis not present

## 2023-12-31 MED ORDER — ESCITALOPRAM OXALATE 5 MG PO TABS: 2.5000 mg | ORAL_TABLET | Freq: Every day | ORAL | 0 refills | Status: DC

## 2023-12-31 NOTE — Telephone Encounter (Signed)
 Please call new prescription of Lexapro and Xanax to his preferred pharmacy.

## 2023-12-31 NOTE — Telephone Encounter (Signed)
 Patient called and he is concerned that he is going to run out of the Lexapro early because he has to cut them in half and that has been difficult for him, he has wasted a lot of pills trying to cut them in half. I spoke with the pharmacy and they can do an early fill on that. He also said that the pharmacy switched the manufacturer on his Xanax so it is not working as well. He found the manufacturer and [facility] [address] has it, so when he is due for a refill he would like that prescription sent to the [facility], the rest of his medication can still go to [facility] on [address]

## 2024-01-04 ENCOUNTER — Encounter (HOSPITAL_COMMUNITY): Payer: Self-pay | Admitting: Psychiatry

## 2024-01-04 ENCOUNTER — Telehealth (HOSPITAL_BASED_OUTPATIENT_CLINIC_OR_DEPARTMENT_OTHER): Admitting: Psychiatry

## 2024-01-04 VITALS — Wt 198.0 lb

## 2024-01-04 DIAGNOSIS — F410 Panic disorder [episodic paroxysmal anxiety] without agoraphobia: Secondary | ICD-10-CM

## 2024-01-04 DIAGNOSIS — F4310 Post-traumatic stress disorder, unspecified: Secondary | ICD-10-CM

## 2024-01-04 DIAGNOSIS — F411 Generalized anxiety disorder: Secondary | ICD-10-CM | POA: Diagnosis not present

## 2024-01-04 MED ORDER — ESCITALOPRAM OXALATE 5 MG PO TABS: 2.5000 mg | ORAL_TABLET | Freq: Every day | ORAL | 1 refills | Status: DC

## 2024-01-04 MED ORDER — TRAZODONE HCL 50 MG PO TABS: 25.0000 mg | ORAL_TABLET | Freq: Every day | ORAL | 1 refills | Status: DC

## 2024-01-04 MED ORDER — LORAZEPAM 1 MG PO TABS: 1.0000 mg | ORAL_TABLET | Freq: Two times a day (BID) | ORAL | 2 refills | Status: DC | PRN

## 2024-01-04 NOTE — Progress Notes (Signed)
 [facility] MD Virtual Progress Note   Patient Location: Home Provider Location: Home Office  I connect with patient by video and verified that I am speaking with correct person by using two identifiers. I discussed the limitations of evaluation and management by telemedicine and the availability of in person appointments. I also discussed with the patient that there may be a patient responsible charge related to this service. The patient expressed understanding and agreed to proceed.  [name] Jiles Sr. [id number] 63 y.o.  01/04/2024 8:44 AM  History of Present Illness:  Patient is evaluated by video session.  Reported things are going okay but he having issues with the pharmacy because he believe lorazepam is not the same and pharmacy recently changed their manufacturing company.  He believe it is not as strong.  I explained it should be the same chemical and strength even pharmacy change the vendors.  He also reported sometimes difficulty cutting down his Lexapro in half and he loses tablets because he cannot cut down exactly half tablet.  He admitted running short on the medication at the end of the month.  Otherwise he feels medicines are working.  He has no dizziness and his brain fog is not as bad.  He has occasionally panic attacks but sleep is good and denies any nightmares, flashbacks.  He is in therapy with Shanda Bumps.  He is taking multiple medication including narcotics for his chronic back pain.  His appetite is okay.  His tremors are not as bad.  He denies any suicidal thoughts or homicidal thoughts.  He denies drinking or using any illegal substances.  Denies any hopelessness or worthlessness.  His appetite is okay and weight is stable.  Past Psychiatric History: H/O anxiety since early 15s.  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  No h/o mania, psychosis, suicidal attempt, inpatient treatment.  Seen and managed by PCP since early 30s. Tried  Remeron, hydroxyzine, gabapentin, Paxil, Prozac, Zoloft, Cymbalta, Klonopin and Lamictal.  Trintellix helped but caused tremors.  Pristiq could not tolerate more than 25 mg.  Viibryd cause diarrhea. Gene-Sight shows Cymbalta, mirtazapine, Paxil, Luvox, clozapine, olanzapine, thiothixene has significant drug interaction.  Viibryd caused weight loss.     Outpatient Encounter Medications as of 01/04/2024  Medication Sig   amLODipine (NORVASC) 10 MG tablet Take 10 mg by mouth daily.    betamethasone dipropionate 0.05 % cream Apply 1 application topically 2 (two) times daily as needed (rash). (Patient not taking: Reported on 09/24/2022)   celecoxib (CELEBREX) 200 MG capsule Take 1 capsule (200 mg total) by mouth 2 (two) times daily.   cyclobenzaprine (FLEXERIL) 10 MG tablet Take 0.5-1 tablets (5-10 mg total) by mouth 3 (three) times daily as needed for muscle spasms.   cyclobenzaprine (FLEXERIL) 10 MG tablet Take 0.5-1 tablets (5-10 mg total) by mouth 2 (two) times daily as needed for muscle spasms.   escitalopram (LEXAPRO) 5 MG tablet Take 0.5 tablets (2.5 mg total) by mouth daily.   famotidine (PEPCID) 40 MG tablet Take 20 mg by mouth 2 (two) times daily.   hydrochlorothiazide (HYDRODIURIL) 12.5 MG tablet Take 3.125 mg by mouth daily.  (Patient not taking: Reported on 11/06/2022)   ibuprofen (ADVIL) 200 MG tablet Take 600 mg by mouth every 6 (six) hours as needed for moderate pain.   LORazepam (ATIVAN) 1 MG tablet Take 1 tablet (1 mg total) by mouth 2 (two) times daily as needed. for anxiety   Multiple  Vitamin (MULTIVITAMIN WITH MINERALS) TABS Take 1 tablet by mouth daily.   ondansetron (ZOFRAN) 4 MG tablet Take 1 tablet (4 mg total) by mouth every 6 (six) hours.   oxyCODONE-acetaminophen (PERCOCET) 10-325 MG tablet Take 1 tablet by mouth every 6 (six) hours as needed for pain.   Probiotic Product (PROBIOTIC PO) Take 1 capsule by mouth daily.   psyllium (METAMUCIL) 58.6 % packet Take 1 packet by mouth  every other day.   sildenafil (VIAGRA) 100 MG tablet Take 100 mg by mouth daily as needed for erectile dysfunction.   SUMAtriptan (IMITREX) 50 MG tablet Take 1 tablet (50 mg total) by mouth every 2 (two) hours as needed. May repeat in 2 hours if headache persists or recurs.   tamsulosin (FLOMAX) 0.4 MG CAPS capsule Take 1 capsule (0.4 mg total) by mouth daily. (Patient not taking: Reported on 11/06/2022)   telmisartan (MICARDIS) 80 MG tablet Take 80 mg by mouth at bedtime.   traZODone (DESYREL) 50 MG tablet Take 0.5-1 tablets (25-50 mg total) by mouth at bedtime.   [DISCONTINUED] simethicone (MYLICON) 80 MG chewable tablet Chew 80 mg by mouth every 6 (six) hours as needed for flatulence.   No facility-administered encounter medications on file as of 01/04/2024.    Recent Results (from the past 2160 hours)  Basic metabolic panel     Status: Abnormal   Collection Time: 10/28/23 12:40 PM  Result Value Ref Range   Sodium 140 135 - 145 mmol/L   Potassium 3.5 3.5 - 5.1 mmol/L   Chloride 108 98 - 111 mmol/L   CO2 22 22 - 32 mmol/L   Glucose, Bld 112 (H) 70 - 99 mg/dL    Comment: Glucose reference range applies only to samples taken after fasting for at least 8 hours.   BUN 7 (L) 8 - 23 mg/dL   Creatinine, Ser 1.61 0.61 - 1.24 mg/dL   Calcium 9.0 8.9 - 09.6 mg/dL   GFR, Estimated >04 >54 mL/min    Comment: (NOTE) Calculated using the CKD-EPI Creatinine Equation (2021)    Anion gap 10 5 - 15    Comment: Performed at [facility], [address] [place], [place] [address]  CBC     Status: Abnormal   Collection Time: 10/28/23 12:40 PM  Result Value Ref Range   WBC 6.7 4.0 - 10.5 K/uL   RBC 4.60 4.22 - 5.81 MIL/uL   Hemoglobin 12.7 (L) 13.0 - 17.0 g/dL   HCT 91.4 78.2 - 95.6 %   MCV 84.8 80.0 - 100.0 fL   MCH 27.6 26.0 - 34.0 pg   MCHC 32.6 30.0 - 36.0 g/dL   RDW 21.3 08.6 - 57.8 %   Platelets 251 150 - 400 K/uL   nRBC 0.0 0.0 - 0.2 %    Comment: Performed at [facility], [address] [place], [place] [address]  Troponin I (High Sensitivity)     Status: None   Collection Time: 10/28/23 12:40 PM  Result Value Ref Range   Troponin I (High Sensitivity) 5 <18 ng/L    Comment: (NOTE) Elevated high sensitivity troponin I (hsTnI) values and significant  changes across serial measurements may suggest ACS but many other  chronic and acute conditions are known to elevate hsTnI results.  Refer to the "Links" section for chest pain algorithms and additional  guidance. Performed at [facility], [address] [place], [place] [address]      Psychiatric Specialty Exam: Physical Exam  Review of Systems  Weight 198 lb (89.8 kg).There is no height or weight on file to calculate BMI.  General Appearance: Casual  Eye Contact:  Good  Speech:  Normal Rate  Volume:  Normal  Mood:  Euthymic  Affect:  Appropriate  Thought Process:  Goal Directed  Orientation:  Full (Time, Place, and Person)  Thought Content:  WDL  Suicidal Thoughts:  No  Homicidal Thoughts:  No  Memory:  Immediate;   Good Recent;   Good Remote;   Good  Judgement:  Intact  Insight:  Present  Psychomotor Activity:  Normal  Concentration:  Concentration: Good and Attention Span: Good  Recall:  Good  Fund of Knowledge:  Good  Language:  Good  Akathisia:  No  Handed:  Right  AIMS (if indicated):     Assets:  Communication Skills Desire for Improvement Housing Social Support Transportation  ADL's:  Intact  Cognition:  WNL  Sleep:  good     Assessment/Plan: Generalized anxiety disorder - Plan: escitalopram (LEXAPRO) 5 MG tablet, traZODone (DESYREL) 50 MG tablet, LORazepam (ATIVAN) 1 MG tablet  Panic attack - Plan: escitalopram (LEXAPRO) 5 MG tablet, LORazepam (ATIVAN) 1 MG tablet  PTSD (post-traumatic stress disorder) - Plan: escitalopram (LEXAPRO) 5 MG tablet  Discussed his pharmacy concerns and I recommend have the pharmacist staff  cut down the Lexapro tablet in half for you so he should not waste the pills.  I also explained that he will pharmacy have different vendors the prescription should be is the same strength and same chemical.  He does not want to change the medication since it is working well and he do not have worsening of symptoms.  I also encouraged to keep appointment with Shanda Bumps for coping skills which she feel going well.  Discussed medication side effects and benefits.  In the past we had tried cut down the benzos but he had significant withdrawals.  Discussed polypharmacy specially taking narcotic medication.  Continue Ativan 1 mg twice a day, trazodone 25 mg at bedtime and Lexapro 2.5 mg daily.  Recommended to call us back with any question or any concern.  Follow-up in 3 months   Follow Up Instructions:     I discussed the assessment and treatment plan with the patient. The patient was provided an opportunity to ask questions and all were answered. The patient agreed with the plan and demonstrated an understanding of the instructions.   The patient was advised to call back or seek an in-person evaluation if the symptoms worsen or if the condition fails to improve as anticipated.    Collaboration of Care: Other provider involved in patient's care AEB notes are available in epic to review  Patient/Guardian was advised Release of Information must be obtained prior to any record release in order to collaborate their care with an outside provider. Patient/Guardian was advised if they have not already done so to contact the registration department to sign all necessary forms in order for [name] to release information regarding their care.   Consent: Patient/Guardian gives verbal consent for treatment and assignment of benefits for services provided during this visit. Patient/Guardian expressed understanding and agreed to proceed.     I provided 18 minutes of non face to face time during this encounter.  Note:  This document was prepared by [employer] voice dictation technology and any errors that results from this process are unintentional.    Cleotis Nipper, MD 01/04/2024

## 2024-01-07 DIAGNOSIS — E538 Deficiency of other specified B group vitamins: Secondary | ICD-10-CM | POA: Diagnosis not present

## 2024-01-13 ENCOUNTER — Other Ambulatory Visit: Payer: Self-pay | Admitting: Endocrinology

## 2024-01-13 DIAGNOSIS — E042 Nontoxic multinodular goiter: Secondary | ICD-10-CM | POA: Diagnosis not present

## 2024-01-19 ENCOUNTER — Encounter: Payer: Self-pay | Admitting: Endocrinology

## 2024-01-20 ENCOUNTER — Other Ambulatory Visit (HOSPITAL_COMMUNITY): Payer: Self-pay | Admitting: Endocrinology

## 2024-01-20 DIAGNOSIS — E0590 Thyrotoxicosis, unspecified without thyrotoxic crisis or storm: Secondary | ICD-10-CM

## 2024-01-21 ENCOUNTER — Ambulatory Visit
Admission: RE | Admit: 2024-01-21 | Discharge: 2024-01-21 | Disposition: A | Discharge status: Home / Self Care | Source: Ambulatory Visit | Attending: Endocrinology | Admitting: Endocrinology

## 2024-01-21 DIAGNOSIS — E042 Nontoxic multinodular goiter: Secondary | ICD-10-CM

## 2024-01-21 DIAGNOSIS — E041 Nontoxic single thyroid nodule: Secondary | ICD-10-CM | POA: Diagnosis not present

## 2024-01-28 DIAGNOSIS — E538 Deficiency of other specified B group vitamins: Secondary | ICD-10-CM | POA: Diagnosis not present

## 2024-02-04 DIAGNOSIS — G894 Chronic pain syndrome: Secondary | ICD-10-CM | POA: Diagnosis not present

## 2024-02-04 DIAGNOSIS — F1120 Opioid dependence, uncomplicated: Secondary | ICD-10-CM | POA: Diagnosis not present

## 2024-02-09 ENCOUNTER — Telehealth (HOSPITAL_COMMUNITY): Payer: Self-pay

## 2024-02-09 NOTE — Telephone Encounter (Signed)
 Patient called about his Lorazepam, it was sent to [facility] and he wanted it to go to [facility] on [address]. I called [facility] and discontinued the prescription there and then called it into [facility]. Patient can pick up when due

## 2024-02-15 ENCOUNTER — Telehealth (HOSPITAL_COMMUNITY): Payer: Medicare HMO | Admitting: Psychiatry

## 2024-02-18 DIAGNOSIS — E538 Deficiency of other specified B group vitamins: Secondary | ICD-10-CM | POA: Diagnosis not present

## 2024-02-22 DIAGNOSIS — R519 Headache, unspecified: Secondary | ICD-10-CM | POA: Diagnosis not present

## 2024-02-23 ENCOUNTER — Other Ambulatory Visit: Payer: Self-pay | Admitting: Family Medicine

## 2024-02-23 DIAGNOSIS — R519 Headache, unspecified: Secondary | ICD-10-CM

## 2024-02-25 ENCOUNTER — Ambulatory Visit
Admission: RE | Admit: 2024-02-25 | Discharge: 2024-02-25 | Disposition: A | Discharge status: Home / Self Care | Source: Ambulatory Visit | Attending: Family Medicine | Admitting: Family Medicine

## 2024-02-25 DIAGNOSIS — R519 Headache, unspecified: Secondary | ICD-10-CM | POA: Diagnosis not present

## 2024-02-25 MED ORDER — GADOPICLENOL 0.5 MMOL/ML IV SOLN
9.0000 mL | Freq: Once | INTRAVENOUS | Status: AC | PRN
Start: 2024-02-25 — End: 2024-02-25
  Administered 2024-02-25: 9 mL via INTRAVENOUS

## 2024-02-26 DIAGNOSIS — M25562 Pain in left knee: Secondary | ICD-10-CM | POA: Diagnosis not present

## 2024-02-26 DIAGNOSIS — G44011 Episodic cluster headache, intractable: Secondary | ICD-10-CM | POA: Diagnosis not present

## 2024-02-28 ENCOUNTER — Other Ambulatory Visit (HOSPITAL_COMMUNITY): Payer: Self-pay | Admitting: Psychiatry

## 2024-02-28 DIAGNOSIS — F411 Generalized anxiety disorder: Secondary | ICD-10-CM

## 2024-02-28 DIAGNOSIS — F4310 Post-traumatic stress disorder, unspecified: Secondary | ICD-10-CM

## 2024-02-28 DIAGNOSIS — F410 Panic disorder [episodic paroxysmal anxiety] without agoraphobia: Secondary | ICD-10-CM

## 2024-02-29 ENCOUNTER — Telehealth: Payer: Self-pay | Admitting: Neurology

## 2024-02-29 NOTE — Telephone Encounter (Signed)
 Returned call to pt and he stated that the sumatriptan  will only be covered for 9 pills of the medication. Needs something more to due daily migraines over right eye especially when he wakes up. Pt stated that he cannot take the imitrex  but once a daily due to only having nine pills but also uses tylenol  1-2 500mg  daily scheduled pt to establish care w/np

## 2024-02-29 NOTE — Telephone Encounter (Signed)
 Patient called stating he needs to see Dr. Gracie Lav ASAP because his primary care prescribes a medicine for him he did not give me the name and they said that he would need to see Dr. Gracie Lav first for his headaches.. Right now he is scheduled for 03/28/2024 but he stated that is too long he needs to be seen sooner than that because he only has about 5 pills left.

## 2024-03-01 DIAGNOSIS — S83242A Other tear of medial meniscus, current injury, left knee, initial encounter: Secondary | ICD-10-CM | POA: Diagnosis not present

## 2024-03-02 ENCOUNTER — Ambulatory Visit: Admitting: Neurology

## 2024-03-02 ENCOUNTER — Encounter: Payer: Self-pay | Admitting: Neurology

## 2024-03-02 VITALS — BP 148/92 | HR 85 | Resp 16 | Ht 70.0 in | Wt 205.6 lb

## 2024-03-02 DIAGNOSIS — R519 Headache, unspecified: Secondary | ICD-10-CM

## 2024-03-02 DIAGNOSIS — F411 Generalized anxiety disorder: Secondary | ICD-10-CM | POA: Diagnosis not present

## 2024-03-02 DIAGNOSIS — G43009 Migraine without aura, not intractable, without status migrainosus: Secondary | ICD-10-CM

## 2024-03-02 DIAGNOSIS — G4452 New daily persistent headache (NDPH): Secondary | ICD-10-CM

## 2024-03-02 MED ORDER — NURTEC 75 MG PO TBDP: 75.0000 mg | ORAL_TABLET | ORAL | 11 refills | Status: DC | PRN

## 2024-03-02 NOTE — Progress Notes (Addendum)
 Chief Complaint  Patient presents with   Migraine    Rm12alone, Migraine: daily for the past 3 weeks, worse at times.triggers: unidentifiable   ASSESSMENT AND PLAN  Cielo Desisto Sr. is a 63 y.o. male with anxiety and right sided headache.  Reports right-sided headache for the last 3 weeks since taking Levitra.  Headaches are typically right-sided, but this has been constant for the last 3 weeks.  MRI of the brain with and without contrast 02/25/24 was unremarkable.  His neuro exam is normal. Is quite anxious.   - Try Nurtec 75 mg tablet as needed for acute headache, headaches with some migraine features.I gave him # 2 boxes today samples.  - Limit Imitrex  use to no more than 2 to 3 days a week, concern for possible rebound headache - Referral for sleep consult, headache present upon morning waking/napping, snoring, daytime fatigue, ESS 18 - Encouraged to reach out via MyChart - Follow-up in 6 months - Next steps: May pursue MRI cervical spine, Dr. Gracie Lav ordered at last visit, the patient did not have completed.  Topamax, propranolol, Depakote. Already on Lexapro , other intolerance to antidepressants.   DIAGNOSTIC DATA (LABS, IMAGING, TESTING) - I reviewed patient records, labs, notes, testing and imaging myself where available.   MEDICAL HISTORY:   Latonya Maleki. is a 63 year old male, seen in request by his psychiatrist Dr. Eduard Grad for evaluation of constellation of complaints, body tremor, worsening anxiety, he is accompanied by his primary care physician Dr. Candance Certain, [place], initial evaluation was on June 19, 2021 with his wife     I reviewed and summarized the referring note. PMHx. HTN Depression anxiety, since 2021,  only taking ativan  0.5mg  tid prn. Cervical decompression surgery,  Adrenal gland nodule,  Intestine surgery in 2018 Pain management for chronic neck and low back pain  He reported history of depression since 2011, went on disability since then, also  due to his previous cervical decompression surgery for cervical radiculopathy,  His depression has worsened since abdominal surgery in 2018, initially for adrenal gland surgery, complicated by intestinal injury per patient, required multiple abdominal surgery later  He was diagnosed with depression anxiety, over the years, he was treated with different medications, most recent one was Trintellix , around April 2022, he noticed intermittent body tremor, worsening anxiety, could not keep himself calm,  Trintellix  was stopped, now he is taking Ativan  0.5 mg 3-4 times daily even more each day, is no longer helpful, he described difficulty keeping calm,   UPDATE January 01 2023: He is alone at today's clinical visit, continues to complains of depression anxiety, constellation of complaints, not sleeping well, feeling anxious,  History of neck surgery for left cervical radiculopathy in 2009, he fell off a truck, landed on his left shoulder, since then, he has frequent headache  Increased frequency of headache, starting from the base of the scalp, spreading forward, sometimes throbbing, lasting for hours to days, denies light noise sensitivity, denies nausea,  He has poor sleep quality, often complains of daytime sleepiness, fatigue  Personally reviewed MRI of the brain September 2022, mild small vessel disease no acute abnormality  Laboratory evaluation from November 05, 2022, A1c 5.1, LDL 78 vitamin D 19, normal TSH 0.86, normal CMP creatinine 0.81 CBC hemoglobin of 13.1, B12 359, within normal range PSA,  Update Mar 02, 2024 SS: Last visit with Dr. Gracie Lav MRI cervical spine ordered, referral for sleep consult, Imitrex  as needed.  ESR, CRP were normal. PCP ordered MRI  brain with and without contrast 02/25/24 that was unremarkable.  Never had sleep consult or MRI cervical spine, didn't want to pursue.   Reports headache in the morning, gets worse as he gets going. Right sided, behind the eye, radiates to right  occipital area. Feels like throbbing, pressure, aching, makes him feel very anxious. Few weeks ago he was given Levitra, the next day he had headache, has only taken twice, last was 3 weeks ago. Will take Imitrex  for headache with good benefit, gets # 9 tablets a month.  In the past with other ED medication caused headaches. Taking tylenol  daily. Has been taking Imitrex  50 mg 1/2-1 tablet daily for the last 2 weeks.   Mentions he snores, gets up once at night to urinate, has anxiety, gets up a 4 AM. Normally good energy. Takes naps in afternoon. He is retired, on disability for neck surgery.   PHYSICAL EXAM:   Vitals:   03/02/24 1011 03/02/24 1015  BP: (!) 144/85 (!) 148/92  Pulse:  85  Resp:  16  SpO2:  95%  Weight:  205 lb 9 oz (93.2 kg)  Height:  5\' 10"  (1.778 m)   Body mass index is 29.49 kg/m.  PHYSICAL EXAMNIATION:  Physical Exam  General: The patient is alert and cooperative at the time of the examination.  Skin: No significant peripheral edema is noted.  Neurologic Exam  Mental status: The patient is alert and oriented x 3 at the time of the examination. The patient has apparent normal recent and remote memory, with an apparently normal attention span and concentration ability.  Cranial nerves: Facial symmetry is present. Speech is normal, no aphasia or dysarthria is noted. Extraocular movements are full. Visual fields are full.  Motor: The patient has good strength in all 4 extremities.  Sensory examination: Soft touch sensation is symmetric on the face, arms, and legs.  Coordination: The patient has good finger-nose-finger and heel-to-shin bilaterally.  Gait and station: The patient has a normal gait.   Reflexes: Deep tendon reflexes are symmetric, except left knee not tested due to knee pain  REVIEW OF SYSTEMS:  Full 14 system review of systems performed and notable only for as above All other review of systems were negative.  ALLERGIES: Allergies  Allergen  Reactions   Citalopram     Other Reaction(s): Headaches, Headaches    Hydrocodone -Acetaminophen      Other Reaction(s): Itching   Hydroxyzine      Other Reaction(s): Headaches   Sertraline     Other Reaction(s): Jittery   Topiramate     Other Reaction(s): Sexual Dysfuntion   Venlafaxine     Other Reaction(s): Tremors   Morphine  Itching and Other (See Comments)    Other Reaction(s): Itching    Penicillins Rash and Other (See Comments)    Has patient had a PCN reaction causing immediate rash, facial/tongue/throat swelling, SOB or lightheadedness with hypotension: Yes  Has patient had a PCN reaction causing severe rash involving mucus membranes or skin necrosis: Unknown  Has patient had a PCN reaction that required hospitalization: No  Has patient had a PCN reaction occurring within the last 10 years: No  If all of the above answers are "NO", then may proceed with Cephalosporin use.  Tolerating Zosyn  05/2017   Varenicline Palpitations    Heart race  Increased panic attacks  Other Reaction(s): Tachycardia    HOME MEDICATIONS: Current Outpatient Medications  Medication Sig Dispense Refill   amLODipine  (NORVASC ) 10 MG tablet Take 10 mg by  mouth daily.      betamethasone  dipropionate 0.05 % cream Apply 1 application  topically 2 (two) times daily as needed (rash).     celecoxib  (CELEBREX ) 200 MG capsule Take 1 capsule (200 mg total) by mouth 2 (two) times daily. 20 capsule 0   cyclobenzaprine  (FLEXERIL ) 10 MG tablet Take 0.5-1 tablets (5-10 mg total) by mouth 3 (three) times daily as needed for muscle spasms. 20 tablet 0   cyclobenzaprine  (FLEXERIL ) 10 MG tablet Take 0.5-1 tablets (5-10 mg total) by mouth 2 (two) times daily as needed for muscle spasms. 20 tablet 0   escitalopram  (LEXAPRO ) 5 MG tablet Take 0.5-1 tablets (2.5-5 mg total) by mouth daily. 30 tablet 1   famotidine  (PEPCID ) 40 MG tablet Take 20 mg by mouth 2 (two) times daily.     ibuprofen  (ADVIL ) 200 MG tablet Take  600 mg by mouth every 6 (six) hours as needed for moderate pain.     LORazepam  (ATIVAN ) 1 MG tablet Take 1 tablet (1 mg total) by mouth 2 (two) times daily as needed. for anxiety 60 tablet 2   Multiple Vitamin (MULTIVITAMIN WITH MINERALS) TABS Take 1 tablet by mouth daily.     ondansetron  (ZOFRAN ) 4 MG tablet Take 1 tablet (4 mg total) by mouth every 6 (six) hours. 12 tablet 0   oxyCODONE -acetaminophen  (PERCOCET) 10-325 MG tablet Take 1 tablet by mouth every 6 (six) hours as needed for pain.     Probiotic Product (PROBIOTIC PO) Take 1 capsule by mouth daily.     psyllium (METAMUCIL) 58.6 % packet Take 1 packet by mouth every other day.     sildenafil (VIAGRA) 100 MG tablet Take 100 mg by mouth daily as needed for erectile dysfunction.     SUMAtriptan  (IMITREX ) 50 MG tablet Take 1 tablet (50 mg total) by mouth every 2 (two) hours as needed. May repeat in 2 hours if headache persists or recurs. 10 tablet 6   tamsulosin  (FLOMAX ) 0.4 MG CAPS capsule Take 1 capsule (0.4 mg total) by mouth daily. 10 capsule 0   telmisartan (MICARDIS) 80 MG tablet Take 80 mg by mouth at bedtime.     traZODone  (DESYREL ) 50 MG tablet Take 0.5-1 tablets (25-50 mg total) by mouth at bedtime. 30 tablet 1   No current facility-administered medications for this visit.    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 06/27/2017   Throat and mouth symptom    itchy throat x 3 weeks   Thyroid  nodule     Tobacco abuse    Wears partial dentures    upper    PAST SURGICAL HISTORY: Past Surgical History:  Procedure Laterality Date   ABDOMINAL SURGERY     ANTERIOR CERVICAL DECOMP/DISCECTOMY FUSION N/A 03/18/2013   Procedure: ANTERIOR CERVICAL DECOMPRESSION/DISCECTOMY FUSION 3 LEVELS;  Surgeon: Manya Sells, MD;  Location: [facility];  Service: Neurosurgery;  Laterality: N/A;  Anterior Cervical Three-Six  Decompression/Diskectomy/Fusion   APPENDECTOMY     CIRCUMCISION  05/04/2006   COLONOSCOPY     CYSTOSCOPY/URETEROSCOPY/HOLMIUM LASER/STENT PLACEMENT Left 11/24/2018   Procedure: CYSTOSCOPY/URETEROSCOPY/HOLMIUM LASER/STENT PLACEMENT;  Surgeon: Samson Croak, MD;  Location: [facility];  Service: Urology;  Laterality: Left;   DECOMPRESSIVE LUMBAR LAMINECTOMY LEVEL 1 Right 01/11/2020   Procedure: RIGHT LUMBAR FOUR-FIVE MINIMALLY INVASIVE LUMBAR DISCECTOMY;  Surgeon: Cannon Champion, MD;  Location: [facility];  Service: Neurosurgery;  Laterality: Right;   EYE SURGERY     bilateral cataract surgery    HEMICOLECTOMY Right 04/13/2000   with ileocolic anastomosis   INGUINAL HERNIA REPAIR     INSERTION OF MESH N/A 07/31/2017   Procedure: INSERTION OF MESH;  Surgeon: Jacolyn Matar, MD;  Location: [facility];  Service: General;  Laterality: N/A;   KNEE ARTHROSCOPY Left 08/01/2013   Procedure: LEFT ARTHROSCOPY KNEE;  Surgeon: Ilean Mall, MD;  Location: [facility];  Service: Orthopedics;  Laterality: Left;   LAPAROSCOPIC ADRENALECTOMY Right 05/06/2017   Procedure: OPEN RIGHT ADRENALECTOMY WITH SMALL BOWEL RESECTION AND ANASTOMOSIS;  Surgeon: Dorrie Gaudier, Alphonso Aschoff, MD;  Location: [facility];  Service: General;  Laterality: Right;   LAPAROSCOPIC LYSIS OF ADHESIONS N/A 07/31/2017   Procedure: LYSIS OF ADHESIONS;  Surgeon: Jacolyn Matar, MD;  Location: [facility];  Service: General;  Laterality: N/A;   LAPAROSCOPY N/A 07/31/2017   Procedure: LAPAROSCOPY DIAGNOSTIC;   Surgeon: Jacolyn Matar, MD;  Location: [facility];  Service: General;  Laterality: N/A;   LAPAROTOMY N/A 05/13/2017   Procedure: EXPLORATORY LAPAROTOMY, with wound dehisense, and repair of anastamotic leak;  Surgeon: [name], Alphonso Aschoff, MD;  Location: [facility];  Service: General;  Laterality: N/A;   LAPAROTOMY N/A 05/14/2017   Procedure: EXPLORATORY LAPAROTOMY, LYSIS OF ADHESIONS/ SMALL BOWEL RESECTION/ WASHOUT OF ABDOMEN AND PLACEMENT OF NEGATIVE PRESSURE DRESSING;  Surgeon: [name], Alphonso Aschoff, MD;  Location: [facility];  Service: General;  Laterality: N/A;   LAPAROTOMY N/A 05/17/2017   Procedure: EXPLORATORY LAPAROTOMY, PLACEMENT OF ILEOSTOMY TUBES, PARTIAL CLOSURE OF FASCIA, WOUND VAC EXCHANGE;  Surgeon: [name], Alphonso Aschoff, MD;  Location: [facility];  Service: General;  Laterality: N/A;   LAPAROTOMY N/A 05/20/2017   Procedure: ABDOMINAL WOUND WASH OUT, WOUND CLOSURE AND WOUND VAC PLACEMENT;  Surgeon: [name], Alphonso Aschoff, MD;  Location: [facility];  Service: General;  Laterality: N/A;   LAPAROTOMY N/A 07/31/2017   Procedure: EXPLORATORY LAPAROTOMY WITH CLOSURE OF ENTEROTOMICS;  Surgeon: Jacolyn Matar, MD;  Location: [facility];  Service: General;  Laterality: N/A;   SHOULDER ARTHROSCOPY W/ ROTATOR CUFF REPAIR Left 07/16/2010   SMALL INTESTINE SURGERY  04/24/2000   adhesiolysis, ileocolic anastomosis   UPPER GASTROINTESTINAL ENDOSCOPY      FAMILY HISTORY: Family History  Problem Relation Age of Onset   Colon cancer Father 1   Breast cancer Sister 59   Thyroid  disease Neg Hx     SOCIAL HISTORY: Social History   Socioeconomic History   Marital status: Married    Spouse name: Not on file   Number of children: Not on file   Years of education: Not on file   Highest education level: Not on file  Occupational History   Not on file  Tobacco Use   Smoking status: Former    Current packs/day: 0.00    Average packs/day: 1 pack/day for 25.0 years (25.0 ttl pk-yrs)    Types: Cigarettes     Start  date: 73    Quit date: 2017    Years since quitting: 8.3   Smokeless tobacco: Never   Tobacco comments:    quit smoking 3 years ago  Vaping Use   Vaping status: Never Used  Substance and Sexual Activity   Alcohol use: Yes    Comment: occasionally   Drug use: No   Sexual activity: Not on file  Other Topics Concern   Not on file  Social History Narrative   Lives in [place] with his wife.   Social Drivers of Corporate investment banker Strain: Not on file  Food Insecurity: Not on file  Transportation Needs: Not on file  Physical Activity: Not on file  Stress: Not on file  Social Connections: Not on file  Intimate Partner Violence: Not on file   Jeanmarie Millet, Maritza Sidles, DNP  [facility] [name] [place], [place] [address] [phone]

## 2024-03-02 NOTE — Patient Instructions (Addendum)
 Try Nurtec at onset of headache, Take 1 tablet at onset of headache, max is 1 tablet in 24 hours. Limit Imitrex  use to no more than 2-3 days a week. Referral to sleep study. Keep me posted.

## 2024-03-02 NOTE — Progress Notes (Signed)
 Chart reviewed, agree above plan ?

## 2024-03-03 ENCOUNTER — Other Ambulatory Visit: Payer: Self-pay | Admitting: Orthopedic Surgery

## 2024-03-03 ENCOUNTER — Telehealth: Payer: Self-pay | Admitting: Neurology

## 2024-03-03 ENCOUNTER — Other Ambulatory Visit (HOSPITAL_COMMUNITY): Payer: Self-pay

## 2024-03-03 ENCOUNTER — Telehealth: Payer: Self-pay

## 2024-03-03 DIAGNOSIS — M25562 Pain in left knee: Secondary | ICD-10-CM

## 2024-03-03 NOTE — Telephone Encounter (Signed)
*  [facility]  Pharmacy Patient Advocate Encounter   Received notification from Pt Calls Messages that prior authorization for Nurtec 75MG  dispersible tablets  is required/requested.   Insurance verification completed.   The patient is insured through [employer] .   Per test claim: PA required; PA submitted to above mentioned insurance via CoverMyMeds Key/confirmation #/EOC [id number] Status is pending

## 2024-03-03 NOTE — Telephone Encounter (Signed)
 Pt called  in Stating that he is not able to receive his Medication  amLODipine  (NORVASC ) 10 MG tablet. Pharmacy informed Pt  need Prior authorization .   [facility] [address], [place] - [address]

## 2024-03-03 NOTE — Telephone Encounter (Signed)
 PA request has been Received. New Encounter has been or will be created for follow up. For additional info see Pharmacy Prior Auth telephone encounter from 05/08.

## 2024-03-03 NOTE — Telephone Encounter (Signed)
 RETURNED CALL TO PT AND CLARIFIED NURTEC NEEDS PA

## 2024-03-04 NOTE — Telephone Encounter (Signed)
 Pharmacy Patient Advocate Encounter  Received notification from [employer] that Prior Authorization for Nurtec 75MG  dispersible tablets  has been DENIED.  Full denial letter will be uploaded to the media tab. See denial reason below.   The drug you asked for is not listed in your preferred drug list (formulary). The preferred drug(s), you may not have tried, are: at least 1 additional triptan (naratriptan tablet OR rizatriptan tablet) Ubrelvy tablet Your provider needs to give us  medical reasons why the preferred drug(s) would not work for you and/or would have bad side effects. Sometimes a preferred drug needs more review for approval. Additionally, some preferred drugs listed may be the same drugs with different strengths or forms. [employer] may only require one strength or form of that drug to be tried. This decision was from [facility] Non-Formulary Exceptions Coverage Policy at [email or web address].

## 2024-03-07 ENCOUNTER — Other Ambulatory Visit (HOSPITAL_COMMUNITY): Payer: Self-pay

## 2024-03-07 DIAGNOSIS — F411 Generalized anxiety disorder: Secondary | ICD-10-CM

## 2024-03-07 DIAGNOSIS — F410 Panic disorder [episodic paroxysmal anxiety] without agoraphobia: Secondary | ICD-10-CM

## 2024-03-07 DIAGNOSIS — F4310 Post-traumatic stress disorder, unspecified: Secondary | ICD-10-CM

## 2024-03-07 MED ORDER — UBRELVY 100 MG PO TABS: 100.0000 mg | ORAL_TABLET | ORAL | 11 refills | Status: DC | PRN

## 2024-03-07 MED ORDER — ESCITALOPRAM OXALATE 5 MG PO TABS: 2.5000 mg | ORAL_TABLET | Freq: Every day | ORAL | 0 refills | Status: DC

## 2024-03-07 NOTE — Addendum Note (Signed)
 Addended by: Wess Hammed on: 03/07/2024 09:20 PM   Modules accepted: Orders

## 2024-03-07 NOTE — Telephone Encounter (Signed)
 We can switch to [place] since preferred by insurance.   Meds ordered this encounter  Medications   Ubrogepant (UBRELVY) 100 MG TABS    Sig: Take 1 tablet (100 mg total) by mouth as needed. Take 1 tablet at onset of headache, may repeat in 2 hours if needed. Max is 200 mg in 24 hours.    Dispense:  12 tablet    Refill:  11

## 2024-03-08 ENCOUNTER — Telehealth: Payer: Self-pay

## 2024-03-08 ENCOUNTER — Other Ambulatory Visit (HOSPITAL_COMMUNITY): Payer: Self-pay

## 2024-03-08 ENCOUNTER — Telehealth: Payer: Self-pay | Admitting: *Deleted

## 2024-03-08 DIAGNOSIS — G43909 Migraine, unspecified, not intractable, without status migrainosus: Secondary | ICD-10-CM | POA: Insufficient documentation

## 2024-03-08 NOTE — Telephone Encounter (Signed)
 [name] [name] is for people diagnosed with Migraine-I did not see a Migraine DX in chart-did you want to add a migraine DX or do you wish for me to try to submit with the current DX code of New Daily Persistent Headache (G44.52) and  Headache (R51.9)? Please advise-Thanks.

## 2024-03-08 NOTE — Telephone Encounter (Signed)
 Pharmacy Patient Advocate Encounter   Received notification from Fax that prior authorization for Ubrelvy 100MG  tablets is required/requested.   Insurance verification completed.   The patient is insured through [employer] .   Per test claim: [id number]

## 2024-03-09 ENCOUNTER — Other Ambulatory Visit (HOSPITAL_COMMUNITY): Payer: Self-pay

## 2024-03-09 NOTE — Telephone Encounter (Signed)
 Pharmacy Patient Advocate Encounter  Received notification from [employer] that Prior Authorization for Ubrelvy 100MG  tablets has been APPROVED from 03/08/2024 to 10/26/2024. Ran test claim, Copay is $12.15. This test claim was processed through [facility]- copay amounts may vary at other pharmacies due to pharmacy/plan contracts, or as the patient moves through the different stages of their insurance plan.   PA #/Case ID/Reference #: PA Case ID #: [id number]

## 2024-03-09 NOTE — Telephone Encounter (Signed)
 Noted.

## 2024-03-12 ENCOUNTER — Ambulatory Visit
Admission: RE | Admit: 2024-03-12 | Discharge: 2024-03-12 | Disposition: A | Discharge status: Home / Self Care | Source: Ambulatory Visit | Attending: Orthopedic Surgery | Admitting: Orthopedic Surgery

## 2024-03-12 DIAGNOSIS — M25562 Pain in left knee: Secondary | ICD-10-CM | POA: Diagnosis not present

## 2024-03-12 DIAGNOSIS — M7122 Synovial cyst of popliteal space [Baker], left knee: Secondary | ICD-10-CM | POA: Diagnosis not present

## 2024-03-12 DIAGNOSIS — M25462 Effusion, left knee: Secondary | ICD-10-CM | POA: Diagnosis not present

## 2024-03-17 DIAGNOSIS — M25562 Pain in left knee: Secondary | ICD-10-CM | POA: Diagnosis not present

## 2024-03-23 ENCOUNTER — Ambulatory Visit (HOSPITAL_BASED_OUTPATIENT_CLINIC_OR_DEPARTMENT_OTHER)
Admission: RE | Admit: 2024-03-23 | Discharge: 2024-03-23 | Disposition: A | Discharge status: Home / Self Care | Source: Ambulatory Visit | Attending: Acute Care | Admitting: Acute Care

## 2024-03-23 DIAGNOSIS — Z87891 Personal history of nicotine dependence: Secondary | ICD-10-CM | POA: Insufficient documentation

## 2024-03-23 DIAGNOSIS — F17210 Nicotine dependence, cigarettes, uncomplicated: Secondary | ICD-10-CM | POA: Diagnosis not present

## 2024-03-23 DIAGNOSIS — Z122 Encounter for screening for malignant neoplasm of respiratory organs: Secondary | ICD-10-CM | POA: Diagnosis not present

## 2024-03-24 DIAGNOSIS — E538 Deficiency of other specified B group vitamins: Secondary | ICD-10-CM | POA: Diagnosis not present

## 2024-03-28 ENCOUNTER — Ambulatory Visit (HOSPITAL_BASED_OUTPATIENT_CLINIC_OR_DEPARTMENT_OTHER)

## 2024-03-28 ENCOUNTER — Ambulatory Visit (INDEPENDENT_AMBULATORY_CARE_PROVIDER_SITE_OTHER): Payer: Self-pay | Admitting: Neurology

## 2024-03-28 DIAGNOSIS — G4452 New daily persistent headache (NDPH): Secondary | ICD-10-CM

## 2024-04-04 DIAGNOSIS — I10 Essential (primary) hypertension: Secondary | ICD-10-CM | POA: Diagnosis not present

## 2024-04-04 DIAGNOSIS — K219 Gastro-esophageal reflux disease without esophagitis: Secondary | ICD-10-CM | POA: Diagnosis not present

## 2024-04-05 ENCOUNTER — Telehealth (HOSPITAL_BASED_OUTPATIENT_CLINIC_OR_DEPARTMENT_OTHER): Admitting: Psychiatry

## 2024-04-05 ENCOUNTER — Encounter (HOSPITAL_COMMUNITY): Payer: Self-pay | Admitting: Psychiatry

## 2024-04-05 DIAGNOSIS — F411 Generalized anxiety disorder: Secondary | ICD-10-CM

## 2024-04-05 DIAGNOSIS — I10 Essential (primary) hypertension: Secondary | ICD-10-CM | POA: Diagnosis not present

## 2024-04-05 DIAGNOSIS — F4310 Post-traumatic stress disorder, unspecified: Secondary | ICD-10-CM

## 2024-04-05 DIAGNOSIS — F410 Panic disorder [episodic paroxysmal anxiety] without agoraphobia: Secondary | ICD-10-CM

## 2024-04-05 MED ORDER — ESCITALOPRAM OXALATE 5 MG PO TABS: 2.5000 mg | ORAL_TABLET | Freq: Every day | ORAL | 1 refills | Status: DC

## 2024-04-05 MED ORDER — TRAZODONE HCL 50 MG PO TABS: 25.0000 mg | ORAL_TABLET | Freq: Every day | ORAL | 1 refills | Status: DC

## 2024-04-05 MED ORDER — LORAZEPAM 1 MG PO TABS
1.0000 mg | ORAL_TABLET | Freq: Two times a day (BID) | ORAL | 2 refills | Status: DC | PRN
Start: 2024-04-05 — End: 2024-07-06

## 2024-04-05 NOTE — Progress Notes (Signed)
 [name] MD Virtual Progress Note   Patient Location: Home Provider Location: Home Office  I connect with patient by video and verified that I am speaking with correct person by using two identifiers. I discussed the limitations of evaluation and management by telemedicine and the availability of in person appointments. I also discussed with the patient that there may be a patient responsible charge related to this service. The patient expressed understanding and agreed to proceed.  [name] [name] Sr. [id number] 63 y.o.  04/05/2024 10:21 AM  History of Present Illness:  Patient is evaluated by video session.  He is taking all his medication as prescribed.  Recently had a visit with his primary care as he was having postural hypotension and dizziness.  His blood pressure medicines were cut down and he is only taking amlodipine  5 mg and telmisartan 40 mg.  He is feeling better.  He had other health issues as lately having headaches, knee pain, back pain.  He is seeing neurology and he is now going to try a new medication for her headaches.  Despite lot of health issues he is handling better than he anticipated.  Sometimes he has brain fog but overall his sleep is okay.  He is taking Lexapro  2.5 mg, trazodone  25 mg and Ativan  1 mg twice a day.  He is in therapy with Camilo Cella.  His tremors are not as bad and he denies any major panic attack, crying spells or any feeling of hopelessness or worthlessness.  Patient told his grandchild who is in high school not interested to go to college and he is disappointed.  Patient does walk every day and keep his routine under control.  His appetite is okay.  His weight is stable.  His PCP is Dr. Arta Bihari at [facility] physician.  Past Psychiatric History: H/O anxiety since early 67s.  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  No h/o mania, psychosis, suicidal attempt, inpatient treatment.  Seen and managed by PCP since early 30s.  Tried Remeron , hydroxyzine , gabapentin , Paxil, Prozac, Zoloft, Cymbalta , Klonopin  and Lamictal .  Trintellix  helped but caused tremors.  Pristiq  could not tolerate more than 25 mg.  Viibryd  cause diarrhea. Gene-Sight shows Cymbalta , mirtazapine , Paxil, Luvox, clozapine, olanzapine , thiothixene has significant drug interaction.  Viibryd  caused weight loss.    Outpatient Encounter Medications as of 04/05/2024  Medication Sig   amLODipine  (NORVASC ) 5 MG tablet Take 5 mg by mouth daily.   telmisartan (MICARDIS) 40 MG tablet Take 40 mg by mouth daily.   betamethasone  dipropionate 0.05 % cream Apply 1 application  topically 2 (two) times daily as needed (rash).   celecoxib  (CELEBREX ) 200 MG capsule Take 1 capsule (200 mg total) by mouth 2 (two) times daily.   cyclobenzaprine  (FLEXERIL ) 10 MG tablet Take 0.5-1 tablets (5-10 mg total) by mouth 2 (two) times daily as needed for muscle spasms.   escitalopram  (LEXAPRO ) 5 MG tablet Take 0.5-1 tablets (2.5-5 mg total) by mouth daily.   famotidine  (PEPCID ) 40 MG tablet Take 20 mg by mouth 2 (two) times daily.   ibuprofen  (ADVIL ) 200 MG tablet Take 600 mg by mouth every 6 (six) hours as needed for moderate pain.   LORazepam  (ATIVAN ) 1 MG tablet Take 1 tablet (1 mg total) by mouth 2 (two) times daily as needed. for anxiety   Multiple Vitamin (MULTIVITAMIN WITH MINERALS) TABS Take 1 tablet by mouth daily.   ondansetron  (ZOFRAN ) 4 MG tablet Take 1 tablet (4  mg total) by mouth every 6 (six) hours.   oxyCODONE -acetaminophen  (PERCOCET) 10-325 MG tablet Take 1 tablet by mouth every 6 (six) hours as needed for pain.   Probiotic Product (PROBIOTIC PO) Take 1 capsule by mouth daily.   psyllium (METAMUCIL) 58.6 % packet Take 1 packet by mouth every other day.   sildenafil (VIAGRA) 100 MG tablet Take 100 mg by mouth daily as needed for erectile dysfunction.   SUMAtriptan  (IMITREX ) 50 MG tablet Take 1 tablet (50 mg total) by mouth every 2 (two) hours as needed. May repeat in  2 hours if headache persists or recurs.   tamsulosin  (FLOMAX ) 0.4 MG CAPS capsule Take 1 capsule (0.4 mg total) by mouth daily.   traZODone  (DESYREL ) 50 MG tablet Take 0.5-1 tablets (25-50 mg total) by mouth at bedtime.   Ubrogepant  (UBRELVY ) 100 MG TABS Take 1 tablet (100 mg total) by mouth as needed. Take 1 tablet at onset of headache, may repeat in 2 hours if needed. Max is 200 mg in 24 hours.   [DISCONTINUED] amLODipine  (NORVASC ) 10 MG tablet Take 10 mg by mouth daily.    [DISCONTINUED] cyclobenzaprine  (FLEXERIL ) 10 MG tablet Take 0.5-1 tablets (5-10 mg total) by mouth 3 (three) times daily as needed for muscle spasms.   [DISCONTINUED] escitalopram  (LEXAPRO ) 5 MG tablet Take 0.5-1 tablets (2.5-5 mg total) by mouth daily.   [DISCONTINUED] LORazepam  (ATIVAN ) 1 MG tablet Take 1 tablet (1 mg total) by mouth 2 (two) times daily as needed. for anxiety   [DISCONTINUED] simethicone  (MYLICON) 80 MG chewable tablet Chew 80 mg by mouth every 6 (six) hours as needed for flatulence.   [DISCONTINUED] telmisartan (MICARDIS) 80 MG tablet Take 80 mg by mouth at bedtime.   [DISCONTINUED] traZODone  (DESYREL ) 50 MG tablet Take 0.5-1 tablets (25-50 mg total) by mouth at bedtime.   No facility-administered encounter medications on file as of 04/05/2024.    No results found for this or any previous visit (from the past 2160 hours).   Psychiatric Specialty Exam: Physical Exam  Review of Systems  Musculoskeletal:  Positive for back pain.       Knee pain  Neurological:  Positive for headaches.    Weight 205 lb (93 kg).Body mass index is 29.41 kg/m.  General Appearance: Casual  Eye Contact:  Good  Speech:  Normal Rate  Volume:  Normal  Mood:  Anxious  Affect:  Appropriate  Thought Process:  Goal Directed  Orientation:  Full (Time, Place, and Person)  Thought Content:  Rumination  Suicidal Thoughts:  No  Homicidal Thoughts:  No  Memory:  Immediate;   Good Recent;   Good Remote;   Good  Judgement:   Good  Insight:  Present  Psychomotor Activity:  Normal  Concentration:  Concentration: Good and Attention Span: Good  Recall:  Good  Fund of Knowledge:  Good  Language:  Good  Akathisia:  No  Handed:  Right  AIMS (if indicated):     Assets:  Communication Skills Desire for Improvement Housing Social Support Transportation  ADL's:  Intact  Cognition:  WNL  Sleep:  ok with Trazodone        06/17/2021   11:51 AM 07/22/2013   10:03 AM  Depression screen PHQ 2/9  Decreased Interest 1 0  Down, Depressed, Hopeless 1 0  PHQ - 2 Score 2 0  Altered sleeping 1   Tired, decreased energy 3   Change in appetite 0   Feeling bad or failure about yourself  0  Trouble concentrating 1   Moving slowly or fidgety/restless 0   Suicidal thoughts 0   PHQ-9 Score 7   Difficult doing work/chores Not difficult at all     Assessment/Plan: Generalized anxiety disorder - Plan: escitalopram  (LEXAPRO ) 5 MG tablet, LORazepam  (ATIVAN ) 1 MG tablet, traZODone  (DESYREL ) 50 MG tablet  Panic attack - Plan: escitalopram  (LEXAPRO ) 5 MG tablet, LORazepam  (ATIVAN ) 1 MG tablet  PTSD (post-traumatic stress disorder) - Plan: escitalopram  (LEXAPRO ) 5 MG tablet  Discussed his current medication and notes from other provider.  Despite a lot of health issues he is handling his anxiety better.  He is sleeping better and does not have any nightmares or flashbacks.  Encouraged to continue therapy with Camilo Cella.  He is taking low-dose Lexapro  2.5 mg and trazodone  25 mg at bedtime.  In the past he had tried higher doses but he could not handle and his tremors get worse.  Continue Ativan  1 mg twice a day.  Patient prefers [facility] where he can get brand-name Ativan .  Discussed benzodiazepine dependence, tolerance, withdrawal and dependency.  Follow-up in 3 months.  Encouraged to call back if there is any question or any concern.  I will review his current medication.  He will contact his PCP to get his blood work  results.   Follow Up Instructions:     I discussed the assessment and treatment plan with the patient. The patient was provided an opportunity to ask questions and all were answered. The patient agreed with the plan and demonstrated an understanding of the instructions.   The patient was advised to call back or seek an in-person evaluation if the symptoms worsen or if the condition fails to improve as anticipated.    Collaboration of Care: Other provider involved in patient's care AEB will contact PCP to get his records including blood work results.  Patient/Guardian was advised Release of Information must be obtained prior to any record release in order to collaborate their care with an outside provider. Patient/Guardian was advised if they have not already done so to contact the registration department to sign all necessary forms in order for us  to release information regarding their care.   Consent: Patient/Guardian gives verbal consent for treatment and assignment of benefits for services provided during this visit. Patient/Guardian expressed understanding and agreed to proceed.     Total encounter time 23 minutes which includes face-to-face time, chart reviewed, care coordination, order entry and documentation during this encounter.   Note: This document was prepared by [employer] voice dictation technology and any errors that results from this process are unintentional.    Arturo Late, MD 04/05/2024

## 2024-04-06 NOTE — Progress Notes (Signed)
 Cancelled appt

## 2024-04-08 ENCOUNTER — Other Ambulatory Visit: Payer: Self-pay

## 2024-04-08 DIAGNOSIS — Z122 Encounter for screening for malignant neoplasm of respiratory organs: Secondary | ICD-10-CM

## 2024-04-08 DIAGNOSIS — Z87891 Personal history of nicotine dependence: Secondary | ICD-10-CM

## 2024-04-19 ENCOUNTER — Ambulatory Visit (INDEPENDENT_AMBULATORY_CARE_PROVIDER_SITE_OTHER): Admitting: Neurology

## 2024-04-19 ENCOUNTER — Encounter: Payer: Self-pay | Admitting: Neurology

## 2024-04-19 VITALS — BP 135/84 | HR 88 | Ht 70.5 in | Wt 206.0 lb

## 2024-04-19 DIAGNOSIS — E663 Overweight: Secondary | ICD-10-CM

## 2024-04-19 DIAGNOSIS — R0683 Snoring: Secondary | ICD-10-CM

## 2024-04-19 DIAGNOSIS — G4719 Other hypersomnia: Secondary | ICD-10-CM | POA: Diagnosis not present

## 2024-04-19 DIAGNOSIS — R351 Nocturia: Secondary | ICD-10-CM | POA: Diagnosis not present

## 2024-04-19 DIAGNOSIS — R519 Headache, unspecified: Secondary | ICD-10-CM

## 2024-04-19 DIAGNOSIS — Z9189 Other specified personal risk factors, not elsewhere classified: Secondary | ICD-10-CM

## 2024-04-19 NOTE — Patient Instructions (Signed)

## 2024-04-19 NOTE — Progress Notes (Signed)
 Subjective:    Patient ID: [name] Desai Sr. is a 63 y.o. male.  HPI    True Mar, MD, PhD [facility] [address], [address] [address] [place], [place] [address]  Dear [name] and [place],  I saw your patient, [name] [name], upon your kind request in my sleep clinic today for initial consultation of his sleep disorder, in particular, concern for underlying obstructive sleep apnea.  The patient is unaccompanied today.  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  His Epworth sleepiness score is 6 out of 24, fatigue severity score is 28 out of 63.  I reviewed your office note from 03/02/2024.  He goes to bed around 10 PM and arises between 7:30 AM and 8 AM.  He denies any witnessed apneas or gasping sensation at night.  He has nocturia about once or twice per average night and has had occasional nocturnal morning headaches.  He lives with his wife and 2 sons, he has 4 kids altogether.  He has been on trazodone  for the past several years, 25 mg at bedtime per psychiatry.  He does not drink caffeine  daily.  He quit smoking some 8 years ago.  He drinks alcohol rarely.  He is medically disabled, he used to be a Naval architect.  His weight has been more or less stable.     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 06/27/2017   Throat and mouth symptom    itchy throat x 3 weeks   Thyroid  nodule    Tobacco abuse    Wears partial dentures    upper    His Past Surgical History Is Significant For: Past Surgical History:  Procedure Laterality Date   ABDOMINAL SURGERY     ANTERIOR CERVICAL DECOMP/DISCECTOMY FUSION N/A 03/18/2013   Procedure: ANTERIOR CERVICAL DECOMPRESSION/DISCECTOMY FUSION 3 LEVELS;  Surgeon: Fairy Levels, MD;  Location: [facility];  Service: Neurosurgery;  Laterality: N/A;  Anterior Cervical Three-Six  Decompression/Diskectomy/Fusion   APPENDECTOMY     CIRCUMCISION  05/04/2006   COLONOSCOPY     CYSTOSCOPY/URETEROSCOPY/HOLMIUM LASER/STENT PLACEMENT Left 11/24/2018   Procedure: CYSTOSCOPY/URETEROSCOPY/HOLMIUM LASER/STENT PLACEMENT;  Surgeon: Carolee Sherwood JONETTA DOUGLAS, MD;  [facility];  Service: Urology;  Laterality: Left;   DECOMPRESSIVE LUMBAR LAMINECTOMY LEVEL 1 Right 01/11/2020   Procedure: RIGHT LUMBAR FOUR-FIVE MINIMALLY INVASIVE LUMBAR DISCECTOMY;  Surgeon: Cheryle Debby LABOR, MD;  Location: [facility];  Service: Neurosurgery;  Laterality: Right;   EYE SURGERY     bilateral cataract surgery    HEMICOLECTOMY  Right 04/13/2000   with ileocolic anastomosis   INGUINAL HERNIA REPAIR     INSERTION OF MESH N/A 07/31/2017   Procedure: INSERTION OF MESH;  Surgeon: Gladis Cough, MD;  Location: [facility];  Service: General;  Laterality: N/A;   KNEE ARTHROSCOPY Left 08/01/2013   Procedure: LEFT ARTHROSCOPY KNEE;  Surgeon: Dempsey JINNY Sensor, MD;  Location: [facility];  Service: Orthopedics;  Laterality: Left;   LAPAROSCOPIC ADRENALECTOMY Right 05/06/2017   Procedure: OPEN RIGHT ADRENALECTOMY WITH SMALL  BOWEL RESECTION AND ANASTOMOSIS;  Surgeon: Stevie, Herlene Righter, MD;  Location: [facility];  Service: General;  Laterality: Right;   LAPAROSCOPIC LYSIS OF ADHESIONS N/A 07/31/2017   Procedure: LYSIS OF ADHESIONS;  Surgeon: Gladis Cough, MD;  Location: [facility];  Service: General;  Laterality: N/A;   LAPAROSCOPY N/A 07/31/2017   Procedure: LAPAROSCOPY DIAGNOSTIC;  Surgeon: Gladis Cough, MD;  Location: [facility];  Service: General;  Laterality: N/A;   LAPAROTOMY N/A 05/13/2017   Procedure: EXPLORATORY LAPAROTOMY, with wound dehisense, and repair of anastamotic leak;  Surgeon: [name], Herlene Righter, MD;  Location: [facility];  Service: General;  Laterality: N/A;   LAPAROTOMY N/A 05/14/2017   Procedure: EXPLORATORY LAPAROTOMY, LYSIS OF ADHESIONS/ SMALL BOWEL RESECTION/ WASHOUT OF ABDOMEN AND PLACEMENT OF NEGATIVE PRESSURE DRESSING;  Surgeon: [name], Herlene Righter, MD;  Location: [facility];  Service: General;  Laterality: N/A;   LAPAROTOMY N/A 05/17/2017   Procedure: EXPLORATORY LAPAROTOMY, PLACEMENT OF ILEOSTOMY TUBES, PARTIAL CLOSURE OF FASCIA, WOUND VAC EXCHANGE;  Surgeon: [name], Herlene Righter, MD;  Location: [facility];  Service: General;  Laterality: N/A;   LAPAROTOMY N/A 05/20/2017   Procedure: ABDOMINAL WOUND WASH OUT, WOUND CLOSURE AND WOUND VAC PLACEMENT;  Surgeon: [name], Herlene Righter, MD;  Location: [facility];  Service: General;  Laterality: N/A;   LAPAROTOMY N/A 07/31/2017   Procedure: EXPLORATORY LAPAROTOMY WITH CLOSURE OF ENTEROTOMICS;  Surgeon: Gladis Cough, MD;  Location: [facility];  Service: General;  Laterality: N/A;   SHOULDER ARTHROSCOPY W/ ROTATOR CUFF REPAIR Left 07/16/2010   SMALL INTESTINE SURGERY  04/24/2000   adhesiolysis, ileocolic anastomosis   UPPER GASTROINTESTINAL ENDOSCOPY      His Family History Is Significant For: Family History  Problem Relation Age of Onset   Colon cancer Father 108   Breast cancer Sister 20   Thyroid  disease Neg Hx    Sleep apnea Neg Hx     His Social  History Is Significant For: Social History   Socioeconomic History   Marital status: Married    Spouse name: Not on file   Number of children: Not on file   Years of education: Not on file   Highest education level: Not on file  Occupational History   Not on file  Tobacco Use   Smoking status: Former    Current packs/day: 0.00    Average packs/day: 1 pack/day for 25.0 years (25.0 ttl pk-yrs)    Types: Cigarettes    Start date: 35    Quit date: 2017    Years since quitting: 8.4   Smokeless tobacco: Never   Tobacco comments:    quit smoking 3 years ago  Vaping Use   Vaping status: Never Used  Substance and Sexual Activity   Alcohol use: Yes    Comment: occasionally   Drug use: No   Sexual activity: Not on file  Other Topics Concern   Not on file  Social History Narrative   Lives in [place] with his wife.   Right handed  Caffeine : none   Social Drivers of Corporate investment banker Strain: Not on file  Food Insecurity: Not on file  Transportation Needs: Not on file  Physical Activity: Not on file  Stress: Not on file  Social Connections: Not on file    His Allergies Are:  Allergies  Allergen Reactions   Citalopram     Other Reaction(s): Headaches, Headaches    Hydrocodone -Acetaminophen      Other Reaction(s): Itching   Hydroxyzine      Other Reaction(s): Headaches   Sertraline     Other Reaction(s): Jittery   Topiramate     Other Reaction(s): Sexual Dysfuntion   Venlafaxine     Other Reaction(s): Tremors   Morphine  Itching and Other (See Comments)    Other Reaction(s): Itching    Penicillins Rash and Other (See Comments)    Has patient had a PCN reaction causing immediate rash, facial/tongue/throat swelling, SOB or lightheadedness with hypotension: Yes  Has patient had a PCN reaction causing severe rash involving mucus membranes or skin necrosis: Unknown  Has patient had a PCN reaction that required hospitalization: No  Has patient had a PCN reaction  occurring within the last 10 years: No  If all of the above answers are NO, then may proceed with Cephalosporin use.  Tolerating Zosyn  05/2017   Varenicline Palpitations    Heart race  Increased panic attacks  Other Reaction(s): Tachycardia  :   His Current Medications Are:  Outpatient Encounter Medications as of 04/19/2024  Medication Sig   betamethasone  dipropionate 0.05 % cream Apply 1 application  topically 2 (two) times daily as needed (rash).   celecoxib  (CELEBREX ) 200 MG capsule Take 1 capsule (200 mg total) by mouth 2 (two) times daily.   cyclobenzaprine  (FLEXERIL ) 10 MG tablet Take 0.5-1 tablets (5-10 mg total) by mouth 2 (two) times daily as needed for muscle spasms.   escitalopram  (LEXAPRO ) 5 MG tablet Take 0.5-1 tablets (2.5-5 mg total) by mouth daily.   famotidine  (PEPCID ) 40 MG tablet Take 20 mg by mouth 2 (two) times daily.   ibuprofen  (ADVIL ) 200 MG tablet Take 600 mg by mouth every 6 (six) hours as needed for moderate pain.   LORazepam  (ATIVAN ) 1 MG tablet Take 1 tablet (1 mg total) by mouth 2 (two) times daily as needed. for anxiety   Multiple Vitamin (MULTIVITAMIN WITH MINERALS) TABS Take 1 tablet by mouth daily.   ondansetron  (ZOFRAN ) 4 MG tablet Take 1 tablet (4 mg total) by mouth every 6 (six) hours.   oxyCODONE -acetaminophen  (PERCOCET) 10-325 MG tablet Take 1 tablet by mouth every 6 (six) hours as needed for pain.   Probiotic Product (PROBIOTIC PO) Take 1 capsule by mouth daily.   psyllium (METAMUCIL) 58.6 % packet Take 1 packet by mouth every other day.   sildenafil (VIAGRA) 100 MG tablet Take 100 mg by mouth daily as needed for erectile dysfunction.   SUMAtriptan  (IMITREX ) 50 MG tablet Take 1 tablet (50 mg total) by mouth every 2 (two) hours as needed. May repeat in 2 hours if headache persists or recurs.   tamsulosin  (FLOMAX ) 0.4 MG CAPS capsule Take 1 capsule (0.4 mg total) by mouth daily.   telmisartan (MICARDIS) 40 MG tablet Take 40 mg by mouth daily.    traZODone  (DESYREL ) 50 MG tablet Take 0.5-1 tablets (25-50 mg total) by mouth at bedtime.   Ubrogepant  (UBRELVY ) 100 MG TABS Take 1 tablet (100 mg total) by mouth as needed. Take 1 tablet at onset of headache, may repeat  in 2 hours if needed. Max is 200 mg in 24 hours.   amLODipine  (NORVASC ) 5 MG tablet Take 5 mg by mouth daily.   [DISCONTINUED] simethicone  (MYLICON) 80 MG chewable tablet Chew 80 mg by mouth every 6 (six) hours as needed for flatulence.   No facility-administered encounter medications on file as of 04/19/2024.  :   Review of Systems:  Out of a complete 14 point review of systems, all are reviewed and negative with the exception of these symptoms as listed below:  Review of Systems  Neurological:        Patient is here alone for sleep consult. He was referred by [name] [name]. He has morning headaches, daytime sleepiness, snoring. He has never had a sleep study. He wakes up with reflux sometimes. Patient states he has no family history of sleep apnea.     Objective:  Neurological Exam  Physical Exam Physical Examination:   Vitals:   04/19/24 1008  BP: 135/84  Pulse: 88    General Examination: The patient is a very pleasant 63 y.o. male in no acute distress. He appears well-developed and well-nourished and well groomed.   HEENT: Normocephalic, atraumatic, pupils are equal, round and reactive to light, extraocular tracking is good without limitation to gaze excursion or nystagmus noted. Hearing is grossly intact. Face is symmetric with normal facial animation. Speech is clear with no dysarthria noted. There is no hypophonia. There is no lip, neck/head, jaw or voice tremor. Neck is supple with full range of passive and active motion. There are no carotid bruits on auscultation. Oropharynx exam reveals: mild mouth dryness, adequate dental hygiene and moderate airway crowding, due to small airway entry, tonsillar size of about 2+ bilaterally, Mallampati class II, neck  circumference 16 1/4 inches.  Mild to moderate overbite.  Tongue protrudes centrally and palate elevates symmetrically.   Chest: Clear to auscultation without wheezing, rhonchi or crackles noted.  Heart: S1+S2+0, regular and normal without murmurs, rubs or gallops noted.   Abdomen: Soft, non-tender and non-distended.  Extremities: There is no pitting edema in the distal lower extremities bilaterally.   Skin: Warm and dry without trophic changes noted.   Musculoskeletal: exam reveals no obvious joint deformities.   Neurologically:  Mental status: The patient is awake, alert and oriented in all 4 spheres. His immediate and remote memory, attention, language skills and fund of knowledge are appropriate. There is no evidence of aphasia, agnosia, apraxia or anomia. Speech is clear with normal prosody and enunciation. Thought process is linear. Mood is normal and affect is normal.  Cranial nerves II - XII are as described above under HEENT exam.  Motor exam: Normal bulk, strength and tone is noted. There is no obvious action or resting tremor.  Fine motor skills and coordination: grossly intact.  Cerebellar testing: No dysmetria or intention tremor. There is no truncal or gait ataxia.  Sensory exam: intact to light touch in the upper and lower extremities.  Gait, station and balance: He stands easily. No veering to one side is noted. No leaning to one side is noted. Posture is age-appropriate and stance is narrow based. Gait shows normal stride length and normal pace. No problems turning are noted.   Assessment and Plan:  In summary, [name] Volkert Sr. 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  While a laboratory attended sleep study is typically considered gold standard for evaluation of sleep disordered breathing, the patient prefers a home sleep test at this time. I had a long chat with the patient about my findings and the diagnosis of sleep apnea, particularly OSA, its prognosis and treatment options. We talked about medical/conservative treatments, surgical interventions and non-pharmacological approaches for symptom control. I explained, in particular, the risks and ramifications of untreated moderate to severe OSA, especially with respect to developing cardiovascular disease down the road, including congestive heart failure (CHF), difficult to treat hypertension, cardiac arrhythmias (particularly A-fib), neurovascular complications including TIA, stroke and dementia. Even type 2 diabetes has, in part, been linked to untreated OSA. Symptoms of untreated OSA may include (but may not be limited to) daytime sleepiness, nocturia (i.e. frequent nighttime urination), memory problems, mood irritability and suboptimally controlled or worsening mood disorder such as depression and/or anxiety, lack of energy, lack of motivation, physical discomfort, as well as recurrent headaches, especially morning or nocturnal headaches. We talked about the importance of maintaining a healthy lifestyle and striving for healthy weight.  I recommended a sleep study at this time. I outlined the differences between a laboratory attended sleep study which is considered more comprehensive and accurate over the option of a home sleep test (HST); the latter may lead to underestimation of sleep disordered breathing in some instances and does not help with diagnosing upper airway resistance syndrome and is not accurate enough to diagnose primary central sleep apnea typically. I outlined  possible surgical and non-surgical treatment options of OSA, including the use of a positive airway pressure (PAP) device (i.e. CPAP, AutoPAP/APAP or BiPAP in certain circumstances), a custom-made dental device (aka oral appliance, which would require a referral to a specialist dentist or orthodontist typically, and is generally speaking not considered for patients with full dentures or edentulous state), upper airway surgical options, such as traditional UPPP (which is not considered a first-line treatment) or the Inspire device (hypoglossal nerve stimulator, which would involve a referral for consultation with an ENT surgeon, after careful selection, following inclusion criteria - also not first-line treatment). I explained the PAP treatment option to the patient in detail, as this is generally considered first-line treatment.  The patient indicated that he would be willing to consider PAP therapy, if the need arises.  We will pick up our discussion about the next steps and treatment options after testing.  We will keep him posted as to the test results by phone call and/or MyChart messaging where possible.  We will plan to follow-up in sleep clinic accordingly as well.  I answered all his questions today and the patient was in agreement.   I encouraged him to call with any interim questions, concerns, problems or updates or email us  through MyChart.  Generally speaking, sleep test authorizations may take up to 2 weeks, sometimes less, sometimes longer, the patient is encouraged to get in touch with us  if they do not hear back from the sleep lab staff directly within the next 2 weeks.  Thank you very much for allowing me to participate in the care of this nice patient. If I can be of any further assistance to you please do not hesitate to talk to me.    Sincerely,   True Mar, MD, PhD

## 2024-04-25 DIAGNOSIS — E538 Deficiency of other specified B group vitamins: Secondary | ICD-10-CM | POA: Diagnosis not present

## 2024-04-28 DIAGNOSIS — F1120 Opioid dependence, uncomplicated: Secondary | ICD-10-CM | POA: Diagnosis not present

## 2024-04-28 DIAGNOSIS — G894 Chronic pain syndrome: Secondary | ICD-10-CM | POA: Diagnosis not present

## 2024-05-06 ENCOUNTER — Ambulatory Visit: Admitting: Neurology

## 2024-05-06 DIAGNOSIS — Z9189 Other specified personal risk factors, not elsewhere classified: Secondary | ICD-10-CM

## 2024-05-06 DIAGNOSIS — G4719 Other hypersomnia: Secondary | ICD-10-CM

## 2024-05-06 DIAGNOSIS — R351 Nocturia: Secondary | ICD-10-CM

## 2024-05-06 DIAGNOSIS — E663 Overweight: Secondary | ICD-10-CM

## 2024-05-06 DIAGNOSIS — G4710 Hypersomnia, unspecified: Secondary | ICD-10-CM | POA: Diagnosis not present

## 2024-05-06 DIAGNOSIS — R0683 Snoring: Secondary | ICD-10-CM

## 2024-05-06 DIAGNOSIS — R519 Headache, unspecified: Secondary | ICD-10-CM

## 2024-05-12 DIAGNOSIS — N401 Enlarged prostate with lower urinary tract symptoms: Secondary | ICD-10-CM | POA: Diagnosis not present

## 2024-05-12 DIAGNOSIS — Z125 Encounter for screening for malignant neoplasm of prostate: Secondary | ICD-10-CM | POA: Diagnosis not present

## 2024-05-12 DIAGNOSIS — N5201 Erectile dysfunction due to arterial insufficiency: Secondary | ICD-10-CM | POA: Diagnosis not present

## 2024-05-12 DIAGNOSIS — R351 Nocturia: Secondary | ICD-10-CM | POA: Diagnosis not present

## 2024-05-12 DIAGNOSIS — R3911 Hesitancy of micturition: Secondary | ICD-10-CM | POA: Diagnosis not present

## 2024-05-17 DIAGNOSIS — R3911 Hesitancy of micturition: Secondary | ICD-10-CM | POA: Diagnosis not present

## 2024-05-17 DIAGNOSIS — R351 Nocturia: Secondary | ICD-10-CM | POA: Diagnosis not present

## 2024-05-17 DIAGNOSIS — N401 Enlarged prostate with lower urinary tract symptoms: Secondary | ICD-10-CM | POA: Diagnosis not present

## 2024-05-17 DIAGNOSIS — R3912 Poor urinary stream: Secondary | ICD-10-CM | POA: Diagnosis not present

## 2024-05-17 DIAGNOSIS — R3914 Feeling of incomplete bladder emptying: Secondary | ICD-10-CM | POA: Diagnosis not present

## 2024-05-19 NOTE — Progress Notes (Unsigned)
   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   PATIENT'S LAST REPORTED EPWORTH SLEEPINESS SCORE (ESS): 6/24.  BMI (at the time of sleep clinic visit and/or test date): 29.1 kg/m  FINDINGS:   Study Protocol:    The SANSA single-point-of-skin-contact chest-worn sensor - an FDA cleared and DOT approved type 4 home sleep test device - measures eight physiological channels,  including blood oxygen saturation (measured via PPG [photoplethysmography]), EKG-derived heart rate, respiratory effort, chest movement (measured via accelerometer), snoring, body position, and actigraphy. The device is designed to be worn for up to 10 hours per study.   Sleep Summary:   Total Recording Time (hours, min): *** hours, *** min  Total Effective Sleep Time (hours, min):  *** hours, *** min  Sleep Efficiency (%):    ***%   Respiratory Indices:   Calculated sAHI (per hour):  ***/hour         Oxygen Saturation Statistics:    Oxygen Saturation (%) Mean: ***%   Minimum oxygen saturation (%):                 ***%   O2 Saturation Range (%): *** - ***%   Time below or at 88% saturation: *** min   Pulse Rate Statistics:   Pulse Mean (bpm):    ***/min    Pulse Range (*** - ***/min)   Snoring: ***   IMPRESSION/DIAGNOSES:   OSA (obstructive sleep apnea), ***  ***Nocturnal Hypoxemia  RECOMMENDATIONS:    ***   INTERPRETING PHYSICIAN:   True Mar, MD, PhD Medical Director, [facility]) Diplomat, ABPN (Neurology and Sleep)   [facility] [address], [address] [place], [place] [address] [phone]

## 2024-05-20 ENCOUNTER — Ambulatory Visit: Payer: Self-pay | Admitting: Neurology

## 2024-05-20 NOTE — Procedures (Signed)
 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   PATIENT'S LAST REPORTED EPWORTH SLEEPINESS SCORE (ESS): 6/24.  BMI (at the time of sleep clinic visit and/or test date): 29.1 kg/m  FINDINGS:   Study Protocol:    The SANSA single-point-of-skin-contact chest-worn sensor - an FDA cleared and DOT approved type 4 home sleep test device - measures eight physiological channels,  including blood oxygen saturation (measured via PPG [photoplethysmography]), EKG-derived heart rate, respiratory effort, chest movement (measured via accelerometer), snoring, body position, and actigraphy. The device is designed to be worn for up to 10 hours per study.   Sleep Summary:   Total Recording Time (hours, min): 7 hours, 1 min  Total Effective Sleep Time (hours, min):  5 hours, 36 min  Sleep Efficiency (%):    80%   Respiratory Indices:   Calculated sAHI (per hour):  1.1/hour         Oxygen Saturation Statistics:    Oxygen Saturation (%) Mean: 94.3%   Minimum oxygen saturation (%):                 80.2%   O2 Saturation Range (%): 80.2-97.4%   Time below or at 88% saturation: 0 min   Pulse Rate Statistics:   Pulse Mean (bpm):    66/min    Pulse Range (56-99/min)   Snoring: Mild to louder  IMPRESSION/DIAGNOSES:    Primary snoring   RECOMMENDATIONS:   This home sleep test does not  demonstrate any significant obstructive or central sleep disordered breathing with a total AHI of less than 5/hour.  His total AHI was 1.1/h, O2 nadir 80.2% without any significant time below 89% saturation for the night, variable snoring was detected, ranging from mild to louder. Treatment with a positive airway pressure device such as AutoPap or CPAP is not indicated based on this test. Snoring may improve with avoidance of the supine sleep position and some degree of weight loss (where clinically appropriate). For disturbing snoring, an oral appliance through dentistry or orthodontics can be considered.  Other causes of the patient's symptoms, including circadian rhythm disturbances, an underlying mood disorder, medication effect and/or an underlying medical problem cannot be ruled out based on this test. Clinical correlation is recommended.  The patient should be cautioned not to drive, work at heights, or operate dangerous or heavy equipment when tired or sleepy. Review and reiteration of good sleep hygiene measures should be pursued with any patient. The patient will be advised to follow up with his referring provider, who will be notified of the test results.   I certify that I have reviewed the raw data recording prior to the issuance of this report in accordance with the standards of the American Academy of Sleep Medicine (AASM).    INTERPRETING PHYSICIAN:   True Mar, MD, PhD Medical Director, [facility]) Diplomat, ABPN (Neurology and Sleep)   [facility] [address], [address] [place], [place] [address] [phone]

## 2024-05-23 ENCOUNTER — Other Ambulatory Visit (HOSPITAL_COMMUNITY): Payer: Self-pay | Admitting: Psychiatry

## 2024-05-23 DIAGNOSIS — F411 Generalized anxiety disorder: Secondary | ICD-10-CM

## 2024-05-23 DIAGNOSIS — F410 Panic disorder [episodic paroxysmal anxiety] without agoraphobia: Secondary | ICD-10-CM

## 2024-05-23 DIAGNOSIS — F4310 Post-traumatic stress disorder, unspecified: Secondary | ICD-10-CM

## 2024-05-25 ENCOUNTER — Other Ambulatory Visit (HOSPITAL_COMMUNITY): Payer: Self-pay

## 2024-05-25 DIAGNOSIS — F411 Generalized anxiety disorder: Secondary | ICD-10-CM

## 2024-05-25 DIAGNOSIS — F410 Panic disorder [episodic paroxysmal anxiety] without agoraphobia: Secondary | ICD-10-CM

## 2024-05-25 DIAGNOSIS — F4310 Post-traumatic stress disorder, unspecified: Secondary | ICD-10-CM

## 2024-05-25 MED ORDER — ESCITALOPRAM OXALATE 5 MG PO TABS: 2.5000 mg | ORAL_TABLET | Freq: Every day | ORAL | 0 refills | Status: DC

## 2024-06-21 DIAGNOSIS — Z0000 Encounter for general adult medical examination without abnormal findings: Secondary | ICD-10-CM | POA: Diagnosis not present

## 2024-06-21 DIAGNOSIS — K219 Gastro-esophageal reflux disease without esophagitis: Secondary | ICD-10-CM | POA: Diagnosis not present

## 2024-06-21 DIAGNOSIS — R7301 Impaired fasting glucose: Secondary | ICD-10-CM | POA: Diagnosis not present

## 2024-06-21 DIAGNOSIS — Z125 Encounter for screening for malignant neoplasm of prostate: Secondary | ICD-10-CM | POA: Diagnosis not present

## 2024-06-21 DIAGNOSIS — J439 Emphysema, unspecified: Secondary | ICD-10-CM | POA: Diagnosis not present

## 2024-06-21 DIAGNOSIS — E538 Deficiency of other specified B group vitamins: Secondary | ICD-10-CM | POA: Diagnosis not present

## 2024-06-21 DIAGNOSIS — Z23 Encounter for immunization: Secondary | ICD-10-CM | POA: Diagnosis not present

## 2024-06-21 DIAGNOSIS — I700 Atherosclerosis of aorta: Secondary | ICD-10-CM | POA: Diagnosis not present

## 2024-06-21 DIAGNOSIS — E042 Nontoxic multinodular goiter: Secondary | ICD-10-CM | POA: Diagnosis not present

## 2024-06-21 DIAGNOSIS — Z79899 Other long term (current) drug therapy: Secondary | ICD-10-CM | POA: Diagnosis not present

## 2024-06-21 DIAGNOSIS — E559 Vitamin D deficiency, unspecified: Secondary | ICD-10-CM | POA: Diagnosis not present

## 2024-06-21 DIAGNOSIS — E7800 Pure hypercholesterolemia, unspecified: Secondary | ICD-10-CM | POA: Diagnosis not present

## 2024-06-24 ENCOUNTER — Other Ambulatory Visit (HOSPITAL_COMMUNITY): Payer: Self-pay | Admitting: Psychiatry

## 2024-06-24 DIAGNOSIS — F410 Panic disorder [episodic paroxysmal anxiety] without agoraphobia: Secondary | ICD-10-CM

## 2024-06-24 DIAGNOSIS — F411 Generalized anxiety disorder: Secondary | ICD-10-CM

## 2024-06-24 DIAGNOSIS — F4310 Post-traumatic stress disorder, unspecified: Secondary | ICD-10-CM

## 2024-07-06 ENCOUNTER — Telehealth (HOSPITAL_BASED_OUTPATIENT_CLINIC_OR_DEPARTMENT_OTHER): Admitting: Psychiatry

## 2024-07-06 ENCOUNTER — Encounter (HOSPITAL_COMMUNITY): Payer: Self-pay | Admitting: Psychiatry

## 2024-07-06 VITALS — Wt 206.0 lb

## 2024-07-06 DIAGNOSIS — F411 Generalized anxiety disorder: Secondary | ICD-10-CM

## 2024-07-06 DIAGNOSIS — F4310 Post-traumatic stress disorder, unspecified: Secondary | ICD-10-CM | POA: Diagnosis not present

## 2024-07-06 DIAGNOSIS — F410 Panic disorder [episodic paroxysmal anxiety] without agoraphobia: Secondary | ICD-10-CM | POA: Diagnosis not present

## 2024-07-06 MED ORDER — ESCITALOPRAM OXALATE 5 MG PO TABS: 2.5000 mg | ORAL_TABLET | Freq: Every day | ORAL | 0 refills | Status: DC

## 2024-07-06 MED ORDER — TRAZODONE HCL 50 MG PO TABS: 25.0000 mg | ORAL_TABLET | Freq: Every day | ORAL | 1 refills | Status: DC

## 2024-07-06 MED ORDER — LORAZEPAM 1 MG PO TABS: 1.0000 mg | ORAL_TABLET | Freq: Two times a day (BID) | ORAL | 2 refills | Status: DC | PRN

## 2024-07-06 NOTE — Progress Notes (Signed)
 [facility] MD Virtual Progress Note   Patient Location: Home Provider Location: Home Office  I connect with patient by video and verified that I am speaking with correct person by using two identifiers. I discussed the limitations of evaluation and management by telemedicine and the availability of in person appointments. I also discussed with the patient that there may be a patient responsible charge related to this service. The patient expressed understanding and agreed to proceed.  [name] Aylward Sr. [id number] 63 y.o.  07/06/2024 10:25 AM  History of Present Illness:  Patient is evaluated by video session.  He reported things are better.  He still have symptoms but they are chronic and manageable.  He is excited as went to beach with the family and grandkids.  Surprisingly he stayed on the 20th floor and in the beginning he was very nervous or anxious but later he was happy because he had up better view.  He also started walking every day which he has not done in a while.  He denies any major panic attack or crying spells but is still have episodes of anxiety and feeling overwhelmed.  He is taking Lexapro  2.5 mg, trazodone  25 mg and Ativan  1 mg twice a day.  He is in therapy with Harlene.  He he has headaches and now he is taking Imitrex  when he had severe headaches.  He also reported chronic back pain and neck pain.  He is in the process of getting further workup for his neck pain and may require physical therapy.  He recently had a visit with his primary care and labs were drawn.  He has low vitamin D and is taking 50,000 vitamin D.  His cholesterol is 174, LDL 90, hemoglobin A1c 5.2, all other labs are normal.  He reported blood pressure still fluctuates and his PCP is trying to adjust the dose.  He reported no longer taking amlodipine  but is still on telmisartan.  Overall he feel his anxiety and depression is not as bad.  He denies any suicidal thoughts or homicidal thoughts.   He admitted few pounds weight gain but his energy level is good.  Denies any hallucination, anger, agitation.  He reported family life is good.  Denies any dizziness or postural hypotension.  He denies any nightmares or flashback.  He takes trazodone  which helps sleep.  Recently had a sleep study but did not have diagnosis of apnea.  He was told he has snoring in the sleep.  Past Psychiatric History: H/O anxiety since early 50s.  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  No h/o mania, psychosis, suicidal attempt, inpatient treatment.  Seen and managed by PCP since early 30s. Tried Remeron , hydroxyzine , gabapentin , Paxil, Prozac, Zoloft, Cymbalta , Klonopin  and Lamictal .  Trintellix  helped but caused tremors.  Pristiq  could not tolerate more than 25 mg.  Viibryd  cause diarrhea. Gene-Sight shows Cymbalta , mirtazapine , Paxil, Luvox, clozapine, olanzapine , thiothixene has significant drug interaction.  Viibryd  caused weight loss.    Outpatient Encounter Medications as of 07/06/2024  Medication Sig   amLODipine  (NORVASC ) 5 MG tablet Take 5 mg by mouth daily.   betamethasone  dipropionate 0.05 % cream Apply 1 application  topically 2 (two) times daily as needed (rash).   celecoxib  (CELEBREX ) 200 MG capsule Take 1 capsule (200 mg total) by mouth 2 (two) times daily.   cyclobenzaprine  (FLEXERIL ) 10 MG tablet Take 0.5-1 tablets (5-10 mg total) by mouth 2 (two) times daily as needed  for muscle spasms.   escitalopram  (LEXAPRO ) 5 MG tablet Take 0.5-1 tablets (2.5-5 mg total) by mouth daily.   famotidine  (PEPCID ) 40 MG tablet Take 20 mg by mouth 2 (two) times daily.   ibuprofen  (ADVIL ) 200 MG tablet Take 600 mg by mouth every 6 (six) hours as needed for moderate pain.   LORazepam  (ATIVAN ) 1 MG tablet Take 1 tablet (1 mg total) by mouth 2 (two) times daily as needed. for anxiety   Multiple Vitamin (MULTIVITAMIN WITH MINERALS) TABS Take 1 tablet by mouth daily.   ondansetron  (ZOFRAN ) 4 MG tablet  Take 1 tablet (4 mg total) by mouth every 6 (six) hours.   oxyCODONE -acetaminophen  (PERCOCET) 10-325 MG tablet Take 1 tablet by mouth every 6 (six) hours as needed for pain.   Probiotic Product (PROBIOTIC PO) Take 1 capsule by mouth daily.   psyllium (METAMUCIL) 58.6 % packet Take 1 packet by mouth every other day.   sildenafil (VIAGRA) 100 MG tablet Take 100 mg by mouth daily as needed for erectile dysfunction.   SUMAtriptan  (IMITREX ) 50 MG tablet Take 1 tablet (50 mg total) by mouth every 2 (two) hours as needed. May repeat in 2 hours if headache persists or recurs.   tamsulosin  (FLOMAX ) 0.4 MG CAPS capsule Take 1 capsule (0.4 mg total) by mouth daily.   telmisartan (MICARDIS) 40 MG tablet Take 40 mg by mouth daily.   traZODone  (DESYREL ) 50 MG tablet Take 0.5-1 tablets (25-50 mg total) by mouth at bedtime.   Ubrogepant  (UBRELVY ) 100 MG TABS Take 1 tablet (100 mg total) by mouth as needed. Take 1 tablet at onset of headache, may repeat in 2 hours if needed. Max is 200 mg in 24 hours.   [DISCONTINUED] simethicone  (MYLICON) 80 MG chewable tablet Chew 80 mg by mouth every 6 (six) hours as needed for flatulence.   No facility-administered encounter medications on file as of 07/06/2024.    No results found for this or any previous visit (from the past 2160 hours).   Psychiatric Specialty Exam: Physical Exam  Review of Systems  Musculoskeletal:  Positive for back pain and neck pain.  Neurological:  Positive for headaches.       Brain fog    Weight 206 lb (93.4 kg).There is no height or weight on file to calculate BMI.  General Appearance: Casual  Eye Contact:  Good  Speech:  Normal Rate  Volume:  Normal  Mood:  Euthymic  Affect:  Appropriate  Thought Process:  Goal Directed  Orientation:  Full (Time, Place, and Person)  Thought Content:  WDL  Suicidal Thoughts:  No  Homicidal Thoughts:  No  Memory:  Immediate;   Good Recent;   Good Remote;   Good  Judgement:  Good  Insight:   Present  Psychomotor Activity:  Normal  Concentration:  Concentration: Good and Attention Span: Good  Recall:  Good  Fund of Knowledge:  Good  Language:  Good  Akathisia:  No  Handed:  Right  AIMS (if indicated):     Assets:  Communication Skills Desire for Improvement Housing Social Support Transportation  ADL's:  Intact  Cognition:  WNL  Sleep:  ok with Trazodone        06/17/2021   11:51 AM 07/22/2013   10:03 AM  Depression screen PHQ 2/9  Decreased Interest 1 0  Down, Depressed, Hopeless 1 0  PHQ - 2 Score 2 0  Altered sleeping 1   Tired, decreased energy 3   Change in appetite 0  Feeling bad or failure about yourself  0   Trouble concentrating 1   Moving slowly or fidgety/restless 0   Suicidal thoughts 0   PHQ-9 Score 7   Difficult doing work/chores Not difficult at all     Assessment/Plan: Generalized anxiety disorder - Plan: escitalopram  (LEXAPRO ) 5 MG tablet, LORazepam  (ATIVAN ) 1 MG tablet, traZODone  (DESYREL ) 50 MG tablet  Panic attack - Plan: escitalopram  (LEXAPRO ) 5 MG tablet, LORazepam  (ATIVAN ) 1 MG tablet  PTSD (post-traumatic stress disorder) - Plan: escitalopram  (LEXAPRO ) 5 MG tablet  Patient is 64 year old man with history of lumbar radiculopathy, chronic back pain, headaches, hypertension, COPD, GERD, tremors, snoring, panic attack, PTSD and generalized anxiety disorder.  Review current medication and collateral information from other provider.  Reviewed blood work results and current medication.  He is on narcotic pain medicine and muscle relaxant..  Labs are stable.  Hemoglobin A1c 5.2.  He is doing better as started walking every day and feel proud going to beach trip and stayed at 20th floor.  Recent sleep study did not find any apnea.  Patient like to keep the current medicine.  Encouraged to continue walking 10 to 15 minutes 3-4 times a day which is helping his symptoms.  Continue Ativan  1 mg twice a day, Lexapro  2.5 mg daily and trazodone  25 mg at  bedtime.  Recommend to call back if is any question or any concern.  Follow-up in 3 months.  Follow Up Instructions:     I discussed the assessment and treatment plan with the patient. The patient was provided an opportunity to ask questions and all were answered. The patient agreed with the plan and demonstrated an understanding of the instructions.   The patient was advised to call back or seek an in-person evaluation if the symptoms worsen or if the condition fails to improve as anticipated.    Collaboration of Care: Other provider involved in patient's care AEB will contact PCP to get his records including blood work results.  Patient/Guardian was advised Release of Information must be obtained prior to any record release in order to collaborate their care with an outside provider. Patient/Guardian was advised if they have not already done so to contact the registration department to sign all necessary forms in order for us  to release information regarding their care.   Consent: Patient/Guardian gives verbal consent for treatment and assignment of benefits for services provided during this visit. Patient/Guardian expressed understanding and agreed to proceed.     Total encounter time 26 minutes which includes face-to-face time, chart reviewed, care coordination, order entry and documentation during this encounter.   Note: This document was prepared by [employer] voice dictation technology and any errors that results from this process are unintentional.    Leni ONEIDA Client, MD 07/06/2024

## 2024-07-07 DIAGNOSIS — G894 Chronic pain syndrome: Secondary | ICD-10-CM | POA: Diagnosis not present

## 2024-07-07 DIAGNOSIS — F1120 Opioid dependence, uncomplicated: Secondary | ICD-10-CM | POA: Diagnosis not present

## 2024-07-19 ENCOUNTER — Encounter: Payer: Self-pay | Admitting: Neurology

## 2024-07-19 ENCOUNTER — Telehealth: Payer: Self-pay | Admitting: Neurology

## 2024-07-19 ENCOUNTER — Ambulatory Visit: Admitting: Neurology

## 2024-07-19 VITALS — BP 171/102 | HR 73 | Ht 70.0 in | Wt 212.0 lb

## 2024-07-19 DIAGNOSIS — G4452 New daily persistent headache (NDPH): Secondary | ICD-10-CM | POA: Diagnosis not present

## 2024-07-19 DIAGNOSIS — G43009 Migraine without aura, not intractable, without status migrainosus: Secondary | ICD-10-CM | POA: Diagnosis not present

## 2024-07-19 DIAGNOSIS — E538 Deficiency of other specified B group vitamins: Secondary | ICD-10-CM | POA: Insufficient documentation

## 2024-07-19 DIAGNOSIS — F419 Anxiety disorder, unspecified: Secondary | ICD-10-CM | POA: Diagnosis not present

## 2024-07-19 MED ORDER — KETOROLAC TROMETHAMINE 60 MG/2ML IM SOLN
60.0000 mg | Freq: Once | INTRAMUSCULAR | Status: AC
  Administered 2024-07-19: 60 mg via INTRAMUSCULAR

## 2024-07-19 MED ORDER — EMGALITY 120 MG/ML ~~LOC~~ SOAJ: 240.0000 mg | SUBCUTANEOUS | 0 refills | Status: DC

## 2024-07-19 MED ORDER — EMGALITY 120 MG/ML ~~LOC~~ SOAJ: 120.0000 mg | SUBCUTANEOUS | 11 refills | Status: DC

## 2024-07-19 NOTE — Patient Instructions (Signed)
 Will give a Toradol  shot  Try Emgality  for migraine prevention  Check MRI cervical spine  Continue Imitrex  for migraine prevention

## 2024-07-19 NOTE — Progress Notes (Signed)
 Chief Complaint  Patient presents with   RM10/HEADACHES    Pt is here Alone. Pt states that his head is killing him. Pt states that he has a headache today that he has had for 2 days. Pt states that he took an Oxycodone  last night, as well as tylenol  and a 1/2 of a tablet this morning as well as tylenol  and nothing has helped.    ASSESSMENT AND PLAN  Eldo Umanzor. is a 63 y.o. male with anxiety and right sided headache consistent with migraine headache.  MRI of the brain with and without contrast 02/25/24 was unremarkable.  History of cervical decompression.   - Start Emgality  with loading dose followed by maintenance dosing for migraine prevention  - Continue Imitrex  50 mg as needed for acute headache - Given 60 mg IM Toradol  injection in office today for 5/10 headache  - Sleep study did not show any sleep apnea - Check MRI cervical spine, history of decompressive surgery, headaches are often right occipital area, cervicogenic - Previously tried and failed: Propranolol (fatigue), Topamax contraindicated due to kidney stones, already on Lexapro  with intolerance to other antidepressants, Nurtec and Ubrelvy  were not helpful - BP was elevated today, provides home readings in the 130's, he is anxious today - Next steps: consider Lamictal  or Depakote for headache prevention, but will run by psychiatrist first, Qulipta - Follow up in December at scheduled appointment   DIAGNOSTIC DATA (LABS, IMAGING, TESTING) - I reviewed patient records, labs, notes, testing and imaging myself where available.   MEDICAL HISTORY:   Adrian Dinovo. is a 62 year old male, seen in request by his psychiatrist Dr. Curry Paterson for evaluation of constellation of complaints, body tremor, worsening anxiety, he is accompanied by his primary care physician Dr. Regino, [place], initial evaluation was on June 19, 2021 with his wife   I reviewed and summarized the referring note. PMHx. HTN Depression  anxiety, since 2021,  only taking ativan  0.5mg  tid prn. Cervical decompression surgery,  Adrenal gland nodule,  Intestine surgery in 2018 Pain management for chronic neck and low back pain  He reported history of depression since 2011, went on disability since then, also due to his previous cervical decompression surgery for cervical radiculopathy,  His depression has worsened since abdominal surgery in 2018, initially for adrenal gland surgery, complicated by intestinal injury per patient, required multiple abdominal surgery later  He was diagnosed with depression anxiety, over the years, he was treated with different medications, most recent one was Trintellix , around April 2022, he noticed intermittent body tremor, worsening anxiety, could not keep himself calm,  Trintellix  was stopped, now he is taking Ativan  0.5 mg 3-4 times daily even more each day, is no longer helpful, he described difficulty keeping calm,   UPDATE January 01 2023: He is alone at today's clinical visit, continues to complains of depression anxiety, constellation of complaints, not sleeping well, feeling anxious,  History of neck surgery for left cervical radiculopathy in 2009, he fell off a truck, landed on his left shoulder, since then, he has frequent headache  Increased frequency of headache, starting from the base of the scalp, spreading forward, sometimes throbbing, lasting for hours to days, denies light noise sensitivity, denies nausea,  He has poor sleep quality, often complains of daytime sleepiness, fatigue  Personally reviewed MRI of the brain September 2022, mild small vessel disease no acute abnormality  Laboratory evaluation from November 05, 2022, A1c 5.1, LDL 78 vitamin D 19, normal TSH  0.86, normal CMP creatinine 0.81 CBC hemoglobin of 13.1, B12 359, within normal range PSA,  Update Mar 02, 2024 SS: Last visit with Dr. Onita MRI cervical spine ordered, referral for sleep consult, Imitrex  as needed.  ESR,  CRP were normal. PCP ordered MRI brain with and without contrast 02/25/24 that was unremarkable.  Never had sleep consult or MRI cervical spine, didn't want to pursue.   Reports headache in the morning, gets worse as he gets going. Right sided, behind the eye, radiates to right occipital area. Feels like throbbing, pressure, aching, makes him feel very anxious. Few weeks ago he was given Levitra, the next day he had headache, has only taken twice, last was 3 weeks ago. Will take Imitrex  for headache with good benefit, gets # 9 tablets a month.  In the past with other ED medication caused headaches. Taking tylenol  daily. Has been taking Imitrex  50 mg 1/2-1 tablet daily for the last 2 weeks.   Mentions he snores, gets up once at night to urinate, has anxiety, gets up a 4 AM. Normally good energy. Takes naps in afternoon. He is retired, on disability for neck surgery.   Update July 19, 2024 SS: Nurtec denied by insurance, Ubrelvy  was approved. Cancelled his appointment with Dr. Onita in June. Dr. Buck recommended sleep study in June, [place] showed no sleep apnea. 2 days ago woke up with right eye pain, turned into migraine, took Imitrex , took the edge off, continues with headache all day. Yesterday continued with dull, moderate headache. Got worse last night, took Tylenol , no benefit. At the moment continues with pain to top of head frontal, radiates back to occipital area. Last night he took, Percocet, Flexeril . This is typical headache pattern for him.  1 migraine a week, can last 3 days. His BP was high today, normal when he is in the doctors office, at home runs in the 130's. Right now 5/10 headache, would like a Toradol  shot today, worries he won't be able to sleep tonight due to headache.  PHYSICAL EXAM:   Vitals:   07/19/24 1350 07/19/24 1440  BP: (!) 163/111 (!) 171/102  Pulse: (!) 111 73  SpO2: 98% 97%  Weight: 212 lb (96.2 kg)   Height: 5' 10 (1.778 m)     Body mass index is 30.42  kg/m.  PHYSICAL EXAMNIATION:  Physical Exam  General: The patient is alert and cooperative at the time of the examination.  Skin: No significant peripheral edema is noted.  Neurologic Exam  Mental status: The patient is alert and oriented x 3 at the time of the examination. The patient has apparent normal recent and remote memory, with an apparently normal attention span and concentration ability.  Cranial nerves: Facial symmetry is present. Speech is normal, no aphasia or dysarthria is noted. Extraocular movements are full. Visual fields are full.  Motor: The patient has good strength in all 4 extremities.  Sensory examination: Soft touch sensation is symmetric on the face, arms, and legs.  Coordination: The patient has good finger-nose-finger and heel-to-shin bilaterally.  Gait and station: The patient has a normal gait.   Reflexes: Deep tendon reflexes are symmetric  REVIEW OF SYSTEMS:  Full 14 system review of systems performed and notable only for as above All other review of systems were negative.  ALLERGIES: Allergies  Allergen Reactions   Citalopram     Other Reaction(s): Headaches, Headaches    Hydrocodone -Acetaminophen      Other Reaction(s): Itching   Hydroxyzine   Other Reaction(s): Headaches   Sertraline     Other Reaction(s): Jittery   Topiramate     Other Reaction(s): Sexual Dysfuntion   Venlafaxine     Other Reaction(s): Tremors   Morphine  Itching and Other (See Comments)    Other Reaction(s): Itching    Penicillins Rash and Other (See Comments)    Has patient had a PCN reaction causing immediate rash, facial/tongue/throat swelling, SOB or lightheadedness with hypotension: Yes  Has patient had a PCN reaction causing severe rash involving mucus membranes or skin necrosis: Unknown  Has patient had a PCN reaction that required hospitalization: No  Has patient had a PCN reaction occurring within the last 10 years: No  If all of the above answers  are NO, then may proceed with Cephalosporin use.  Tolerating Zosyn  05/2017   Varenicline Palpitations    Heart race  Increased panic attacks  Other Reaction(s): Tachycardia    HOME MEDICATIONS: Current Outpatient Medications  Medication Sig Dispense Refill   amLODipine  (NORVASC ) 5 MG tablet Take 5 mg by mouth daily.     betamethasone  dipropionate 0.05 % cream Apply 1 application  topically 2 (two) times daily as needed (rash).     cyclobenzaprine  (FLEXERIL ) 10 MG tablet Take 0.5-1 tablets (5-10 mg total) by mouth 2 (two) times daily as needed for muscle spasms. 20 tablet 0   escitalopram  (LEXAPRO ) 5 MG tablet Take 0.5-1 tablets (2.5-5 mg total) by mouth daily. 90 tablet 0   famotidine  (PEPCID ) 40 MG tablet Take 20 mg by mouth 2 (two) times daily.     Galcanezumab -gnlm (EMGALITY ) 120 MG/ML SOAJ Inject 240 mg into the skin every 30 (thirty) days. 2 mL 0   Galcanezumab -gnlm (EMGALITY ) 120 MG/ML SOAJ Inject 120 mg into the skin every 30 (thirty) days. 1.12 mL 11   ibuprofen  (ADVIL ) 200 MG tablet Take 600 mg by mouth every 6 (six) hours as needed for moderate pain.     LORazepam  (ATIVAN ) 1 MG tablet Take 1 tablet (1 mg total) by mouth 2 (two) times daily as needed. for anxiety 60 tablet 2   Multiple Vitamin (MULTIVITAMIN WITH MINERALS) TABS Take 1 tablet by mouth daily.     ondansetron  (ZOFRAN ) 4 MG tablet Take 1 tablet (4 mg total) by mouth every 6 (six) hours. 12 tablet 0   oxyCODONE -acetaminophen  (PERCOCET) 10-325 MG tablet Take 1 tablet by mouth every 6 (six) hours as needed for pain.     Probiotic Product (PROBIOTIC PO) Take 1 capsule by mouth daily.     psyllium (METAMUCIL) 58.6 % packet Take 1 packet by mouth every other day.     sildenafil (VIAGRA) 100 MG tablet Take 100 mg by mouth daily as needed for erectile dysfunction.     SUMAtriptan  (IMITREX ) 50 MG tablet Take 1 tablet (50 mg total) by mouth every 2 (two) hours as needed. May repeat in 2 hours if headache persists or recurs.  10 tablet 6   telmisartan (MICARDIS) 40 MG tablet Take 40 mg by mouth daily.     traZODone  (DESYREL ) 50 MG tablet Take 0.5-1 tablets (25-50 mg total) by mouth at bedtime. 30 tablet 1   tamsulosin  (FLOMAX ) 0.4 MG CAPS capsule Take 1 capsule (0.4 mg total) by mouth daily. (Patient not taking: Reported on 07/19/2024) 10 capsule 0   No current facility-administered medications for this visit.    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   left   Erythrocytosis 11/30/2014   Essential hypertension    GERD (gastroesophageal reflux disease)    Headache(784.0)    2 x/week   History of kidney stones    Hypothyroidism    hx of    Kidney stone    Leukocytosis, unspecified 11/03/2013   Limited joint range of motion    cervical fusion 02/2013   Medial meniscus tear 06/2013   left knee   Pheochromocytoma, right, 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 06/27/2017   Throat and mouth symptom    itchy throat x 3 weeks   Thyroid  nodule    Tobacco abuse    Wears partial dentures    upper    PAST SURGICAL HISTORY: Past Surgical History:  Procedure Laterality Date   ABDOMINAL SURGERY     ANTERIOR CERVICAL DECOMP/DISCECTOMY FUSION N/A 03/18/2013   Procedure: ANTERIOR CERVICAL DECOMPRESSION/DISCECTOMY FUSION 3 LEVELS;  Surgeon: Fairy Levels, MD;  Location: [facility];  Service: Neurosurgery;  Laterality: N/A;  Anterior Cervical Three-Six  Decompression/Diskectomy/Fusion   APPENDECTOMY     CIRCUMCISION  05/04/2006   COLONOSCOPY     CYSTOSCOPY/URETEROSCOPY/HOLMIUM LASER/STENT PLACEMENT Left 11/24/2018   Procedure: CYSTOSCOPY/URETEROSCOPY/HOLMIUM LASER/STENT PLACEMENT;  Surgeon: Carolee Sherwood JONETTA DOUGLAS, MD;  Location[facility];  Service: Urology;  Laterality: Left;   DECOMPRESSIVE LUMBAR LAMINECTOMY LEVEL 1 Right 01/11/2020   Procedure: RIGHT LUMBAR FOUR-FIVE MINIMALLY  INVASIVE LUMBAR DISCECTOMY;  Surgeon: Cheryle Debby LABOR, MD;  Location: [facility];  Service: Neurosurgery;  Laterality: Right;   EYE SURGERY     bilateral cataract surgery    HEMICOLECTOMY Right 04/13/2000   with ileocolic anastomosis   INGUINAL HERNIA REPAIR     INSERTION OF MESH N/A 07/31/2017   Procedure: INSERTION OF MESH;  Surgeon: Gladis Cough, MD;  Location: [facility];  Service: General;  Laterality: N/A;   KNEE ARTHROSCOPY Left 08/01/2013   Procedure: LEFT ARTHROSCOPY KNEE;  Surgeon: Dempsey JINNY Sensor, MD;  Location: [facility];  Service: Orthopedics;  Laterality: Left;   LAPAROSCOPIC ADRENALECTOMY Right 05/06/2017   Procedure: OPEN RIGHT ADRENALECTOMY WITH SMALL BOWEL RESECTION AND ANASTOMOSIS;  Surgeon: Stevie, Herlene Righter, MD;  Location: [facility];  Service: General;  Laterality: Right;   LAPAROSCOPIC LYSIS OF ADHESIONS N/A 07/31/2017   Procedure: LYSIS OF ADHESIONS;  Surgeon: Gladis Cough, MD;  Location: [facility];  Service: General;  Laterality: N/A;   LAPAROSCOPY N/A 07/31/2017   Procedure: LAPAROSCOPY DIAGNOSTIC;  Surgeon: Gladis Cough, MD;  Location: [facility];  Service: General;  Laterality: N/A;   LAPAROTOMY N/A 05/13/2017   Procedure: EXPLORATORY LAPAROTOMY, with wound dehisense, and repair of anastamotic leak;  Surgeon: Stevie Herlene Righter, MD;  Location: [facility];  Service: General;  Laterality: N/A;   LAPAROTOMY N/A 05/14/2017   Procedure: EXPLORATORY LAPAROTOMY, LYSIS OF ADHESIONS/ SMALL BOWEL RESECTION/ WASHOUT OF ABDOMEN AND PLACEMENT OF NEGATIVE PRESSURE DRESSING;  Surgeon: Stevie, Herlene Righter, MD;  Location: [facility];  Service: General;  Laterality: N/A;   LAPAROTOMY N/A 05/17/2017   Procedure: EXPLORATORY LAPAROTOMY, PLACEMENT OF ILEOSTOMY TUBES, PARTIAL CLOSURE OF FASCIA, WOUND VAC EXCHANGE;  Surgeon: [name], Herlene Righter, MD;  Location: [facility];  Service: General;  Laterality: N/A;   LAPAROTOMY N/A 05/20/2017   Procedure: ABDOMINAL WOUND WASH OUT, WOUND  CLOSURE AND WOUND VAC PLACEMENT;  Surgeon: [name], Herlene Righter, MD;  Location: [facility];  Service: General;  Laterality: N/A;   LAPAROTOMY N/A 07/31/2017   Procedure:  EXPLORATORY LAPAROTOMY WITH CLOSURE OF ENTEROTOMICS;  Surgeon: Gladis Cough, MD;  Location: [facility];  Service: General;  Laterality: N/A;   SHOULDER ARTHROSCOPY W/ ROTATOR CUFF REPAIR Left 07/16/2010   SMALL INTESTINE SURGERY  04/24/2000   adhesiolysis, ileocolic anastomosis   UPPER GASTROINTESTINAL ENDOSCOPY      FAMILY HISTORY: Family History  Problem Relation Age of Onset   Colon cancer Father 37   Breast cancer Sister 39   Thyroid  disease Neg Hx    Sleep apnea Neg Hx     SOCIAL HISTORY: Social History   Socioeconomic History   Marital status: Married    Spouse name: Not on file   Number of children: Not on file   Years of education: Not on file   Highest education level: Not on file  Occupational History   Not on file  Tobacco Use   Smoking status: Former    Current packs/day: 0.00    Average packs/day: 1 pack/day for 25.0 years (25.0 ttl pk-yrs)    Types: Cigarettes    Start date: 32    Quit date: 2017    Years since quitting: 8.7   Smokeless tobacco: Never   Tobacco comments:    quit smoking 3 years ago  Vaping Use   Vaping status: Never Used  Substance and Sexual Activity   Alcohol use: Yes    Comment: occasionally   Drug use: No   Sexual activity: Not on file  Other Topics Concern   Not on file  Social History Narrative   Lives in [place] with his wife.   Right handed   Caffeine : none   Social Drivers of Corporate investment banker Strain: Not on file  Food Insecurity: Not on file  Transportation Needs: Not on file  Physical Activity: Not on file  Stress: Not on file  Social Connections: Not on file  Intimate Partner Violence: Not on file   Lauraine Born, SCHARLENE, DNP  [facility] [address], [address] [place], [place] [address] [phone]

## 2024-07-19 NOTE — Telephone Encounter (Signed)
 Called and spoke to pt and was able to schedule appt for today as verbally requested by aj the rn pt voiced gratitude and understanding

## 2024-07-19 NOTE — Telephone Encounter (Signed)
 Pt called states that he is having a hard time sleeping . Pt states that his head is hurting and his eyes are  in pain which is the reason Pt could not sleep .Pt states at first medication was working  , However now medication seem to not be helping.  Pt state he has not been to sleeping 2 days . Pt would like to discuss this with Nurse .  Pt is taking  SUMAtriptan  (IMITREX ) 50 MG tablet

## 2024-07-20 ENCOUNTER — Telehealth: Payer: Self-pay

## 2024-07-20 ENCOUNTER — Encounter: Payer: Self-pay | Admitting: Neurology

## 2024-07-20 ENCOUNTER — Telehealth: Payer: Self-pay | Admitting: Neurology

## 2024-07-20 ENCOUNTER — Other Ambulatory Visit (HOSPITAL_COMMUNITY): Payer: Self-pay

## 2024-07-20 DIAGNOSIS — E538 Deficiency of other specified B group vitamins: Secondary | ICD-10-CM | POA: Diagnosis not present

## 2024-07-20 NOTE — Telephone Encounter (Signed)
 Pharmacy Patient Advocate Encounter  Received notification from [employer] that Prior Authorization for Emgality  has been APPROVED from 07/20/2024 to 10/26/2024. Ran test claim, Copay is $12.15. This test claim was processed through [facility]- copay amounts may vary at other pharmacies due to pharmacy/plan contracts, or as the patient moves through the different stages of their insurance plan.   PA #/Case ID/Reference #: [id number]  [id number]   I sent the patient a MyChart message to make him aware of the approval.

## 2024-07-20 NOTE — Telephone Encounter (Signed)
 Order sent to [facility] to schedule. [phone]

## 2024-07-25 ENCOUNTER — Encounter: Payer: Self-pay | Admitting: Neurology

## 2024-07-25 ENCOUNTER — Other Ambulatory Visit (HOSPITAL_COMMUNITY): Payer: Self-pay

## 2024-07-28 ENCOUNTER — Ambulatory Visit
Admission: RE | Admit: 2024-07-28 | Discharge: 2024-07-28 | Disposition: A | Discharge status: Home / Self Care | Source: Ambulatory Visit | Attending: Neurology | Admitting: Neurology

## 2024-07-28 DIAGNOSIS — G4452 New daily persistent headache (NDPH): Secondary | ICD-10-CM | POA: Diagnosis not present

## 2024-07-29 DIAGNOSIS — G894 Chronic pain syndrome: Secondary | ICD-10-CM | POA: Diagnosis not present

## 2024-07-29 DIAGNOSIS — M5451 Vertebrogenic low back pain: Secondary | ICD-10-CM | POA: Diagnosis not present

## 2024-07-29 DIAGNOSIS — R293 Abnormal posture: Secondary | ICD-10-CM | POA: Diagnosis not present

## 2024-07-29 DIAGNOSIS — M542 Cervicalgia: Secondary | ICD-10-CM | POA: Diagnosis not present

## 2024-07-29 DIAGNOSIS — M6281 Muscle weakness (generalized): Secondary | ICD-10-CM | POA: Diagnosis not present

## 2024-07-29 DIAGNOSIS — M2569 Stiffness of other specified joint, not elsewhere classified: Secondary | ICD-10-CM | POA: Diagnosis not present

## 2024-08-01 ENCOUNTER — Ambulatory Visit: Payer: Self-pay | Admitting: Neurology

## 2024-08-02 DIAGNOSIS — M2569 Stiffness of other specified joint, not elsewhere classified: Secondary | ICD-10-CM | POA: Diagnosis not present

## 2024-08-02 DIAGNOSIS — G894 Chronic pain syndrome: Secondary | ICD-10-CM | POA: Diagnosis not present

## 2024-08-02 DIAGNOSIS — R293 Abnormal posture: Secondary | ICD-10-CM | POA: Diagnosis not present

## 2024-08-02 DIAGNOSIS — M542 Cervicalgia: Secondary | ICD-10-CM | POA: Diagnosis not present

## 2024-08-02 DIAGNOSIS — M5451 Vertebrogenic low back pain: Secondary | ICD-10-CM | POA: Diagnosis not present

## 2024-08-02 DIAGNOSIS — M6281 Muscle weakness (generalized): Secondary | ICD-10-CM | POA: Diagnosis not present

## 2024-08-09 DIAGNOSIS — M6281 Muscle weakness (generalized): Secondary | ICD-10-CM | POA: Diagnosis not present

## 2024-08-09 DIAGNOSIS — M5451 Vertebrogenic low back pain: Secondary | ICD-10-CM | POA: Diagnosis not present

## 2024-08-09 DIAGNOSIS — M2569 Stiffness of other specified joint, not elsewhere classified: Secondary | ICD-10-CM | POA: Diagnosis not present

## 2024-08-09 DIAGNOSIS — G894 Chronic pain syndrome: Secondary | ICD-10-CM | POA: Diagnosis not present

## 2024-08-09 DIAGNOSIS — R293 Abnormal posture: Secondary | ICD-10-CM | POA: Diagnosis not present

## 2024-08-09 DIAGNOSIS — M542 Cervicalgia: Secondary | ICD-10-CM | POA: Diagnosis not present

## 2024-08-15 DIAGNOSIS — G43909 Migraine, unspecified, not intractable, without status migrainosus: Secondary | ICD-10-CM | POA: Diagnosis not present

## 2024-08-15 DIAGNOSIS — I10 Essential (primary) hypertension: Secondary | ICD-10-CM | POA: Diagnosis not present

## 2024-08-15 DIAGNOSIS — R519 Headache, unspecified: Secondary | ICD-10-CM | POA: Diagnosis not present

## 2024-08-15 DIAGNOSIS — M542 Cervicalgia: Secondary | ICD-10-CM | POA: Diagnosis not present

## 2024-08-16 DIAGNOSIS — M2569 Stiffness of other specified joint, not elsewhere classified: Secondary | ICD-10-CM | POA: Diagnosis not present

## 2024-08-16 DIAGNOSIS — M542 Cervicalgia: Secondary | ICD-10-CM | POA: Diagnosis not present

## 2024-08-16 DIAGNOSIS — M5451 Vertebrogenic low back pain: Secondary | ICD-10-CM | POA: Diagnosis not present

## 2024-08-16 DIAGNOSIS — R293 Abnormal posture: Secondary | ICD-10-CM | POA: Diagnosis not present

## 2024-08-16 DIAGNOSIS — M6281 Muscle weakness (generalized): Secondary | ICD-10-CM | POA: Diagnosis not present

## 2024-08-16 DIAGNOSIS — G894 Chronic pain syndrome: Secondary | ICD-10-CM | POA: Diagnosis not present

## 2024-08-25 DIAGNOSIS — I10 Essential (primary) hypertension: Secondary | ICD-10-CM | POA: Diagnosis not present

## 2024-08-25 DIAGNOSIS — E538 Deficiency of other specified B group vitamins: Secondary | ICD-10-CM | POA: Diagnosis not present

## 2024-09-06 DIAGNOSIS — R293 Abnormal posture: Secondary | ICD-10-CM | POA: Diagnosis not present

## 2024-09-06 DIAGNOSIS — G894 Chronic pain syndrome: Secondary | ICD-10-CM | POA: Diagnosis not present

## 2024-09-06 DIAGNOSIS — M542 Cervicalgia: Secondary | ICD-10-CM | POA: Diagnosis not present

## 2024-09-06 DIAGNOSIS — M2569 Stiffness of other specified joint, not elsewhere classified: Secondary | ICD-10-CM | POA: Diagnosis not present

## 2024-09-06 DIAGNOSIS — M6281 Muscle weakness (generalized): Secondary | ICD-10-CM | POA: Diagnosis not present

## 2024-09-06 DIAGNOSIS — M5451 Vertebrogenic low back pain: Secondary | ICD-10-CM | POA: Diagnosis not present

## 2024-09-15 DIAGNOSIS — E559 Vitamin D deficiency, unspecified: Secondary | ICD-10-CM | POA: Diagnosis not present

## 2024-09-15 DIAGNOSIS — G43909 Migraine, unspecified, not intractable, without status migrainosus: Secondary | ICD-10-CM | POA: Diagnosis not present

## 2024-09-15 DIAGNOSIS — M542 Cervicalgia: Secondary | ICD-10-CM | POA: Diagnosis not present

## 2024-09-15 DIAGNOSIS — I10 Essential (primary) hypertension: Secondary | ICD-10-CM | POA: Diagnosis not present

## 2024-09-15 DIAGNOSIS — Z23 Encounter for immunization: Secondary | ICD-10-CM | POA: Diagnosis not present

## 2024-09-26 DIAGNOSIS — E538 Deficiency of other specified B group vitamins: Secondary | ICD-10-CM | POA: Diagnosis not present

## 2024-10-03 DIAGNOSIS — G894 Chronic pain syndrome: Secondary | ICD-10-CM | POA: Diagnosis not present

## 2024-10-03 DIAGNOSIS — F1120 Opioid dependence, uncomplicated: Secondary | ICD-10-CM | POA: Diagnosis not present

## 2024-10-03 DIAGNOSIS — Z6830 Body mass index (BMI) 30.0-30.9, adult: Secondary | ICD-10-CM | POA: Diagnosis not present

## 2024-10-05 ENCOUNTER — Telehealth (HOSPITAL_COMMUNITY): Admitting: Psychiatry

## 2024-10-07 ENCOUNTER — Telehealth: Payer: Self-pay

## 2024-10-07 NOTE — Telephone Encounter (Signed)
 Hello Prescriber!  We are in the process of submitting a prior authorization for your patient for Emgality . We are reaching out for clinical guidance for the following information to complete the request: Plan requiring documentation of clinical benefit since starting therapy.   Thank you! Pharmacy Team

## 2024-10-11 ENCOUNTER — Other Ambulatory Visit (HOSPITAL_COMMUNITY): Payer: Self-pay | Admitting: Psychiatry

## 2024-10-11 DIAGNOSIS — F4310 Post-traumatic stress disorder, unspecified: Secondary | ICD-10-CM

## 2024-10-11 DIAGNOSIS — F410 Panic disorder [episodic paroxysmal anxiety] without agoraphobia: Secondary | ICD-10-CM

## 2024-10-11 DIAGNOSIS — F411 Generalized anxiety disorder: Secondary | ICD-10-CM

## 2024-10-17 ENCOUNTER — Other Ambulatory Visit (HOSPITAL_COMMUNITY): Payer: Self-pay | Admitting: *Deleted

## 2024-10-17 DIAGNOSIS — F410 Panic disorder [episodic paroxysmal anxiety] without agoraphobia: Secondary | ICD-10-CM

## 2024-10-17 DIAGNOSIS — F411 Generalized anxiety disorder: Secondary | ICD-10-CM

## 2024-10-17 DIAGNOSIS — F4310 Post-traumatic stress disorder, unspecified: Secondary | ICD-10-CM

## 2024-10-17 MED ORDER — ESCITALOPRAM OXALATE 5 MG PO TABS: 2.5000 mg | ORAL_TABLET | Freq: Every day | ORAL | 0 refills | Status: AC

## 2024-10-18 ENCOUNTER — Ambulatory Visit: Admitting: Neurology

## 2024-10-18 ENCOUNTER — Encounter: Payer: Self-pay | Admitting: Neurology

## 2024-10-18 VITALS — BP 164/99 | HR 95 | Ht 70.5 in | Wt 210.0 lb

## 2024-10-18 DIAGNOSIS — F419 Anxiety disorder, unspecified: Secondary | ICD-10-CM

## 2024-10-18 DIAGNOSIS — M542 Cervicalgia: Secondary | ICD-10-CM

## 2024-10-18 DIAGNOSIS — G43009 Migraine without aura, not intractable, without status migrainosus: Secondary | ICD-10-CM

## 2024-10-18 DIAGNOSIS — G8929 Other chronic pain: Secondary | ICD-10-CM

## 2024-10-18 MED ORDER — QULIPTA 60 MG PO TABS: 60.0000 mg | ORAL_TABLET | Freq: Every day | ORAL | 11 refills | Status: AC

## 2024-10-18 NOTE — Patient Instructions (Signed)
 Start Qulipta  60 mg daily for migraine prevention  Follow up with [facility] to see if option for injection

## 2024-10-18 NOTE — Progress Notes (Signed)
 "  Chief Complaint  Patient presents with   Migraine    Rm10, alone, Migraine:daily migraines with varying intensity. Pt wants to make sure np read mri and is aware of bone spurs   ASSESSMENT AND PLAN  [name] Canady Sr. is a 63 y.o. male with anxiety and right sided headache consistent with migraine headache, some features of occipital neuralgia/cervicogenic headache.  MRI of the brain with and without contrast 02/25/24 was unremarkable.  History of cervical decompression. MRI cervical spine showed Post-operative ACDF C3-C6. At C6-7 posterior osteophytic spurring resulting in severe bilateral foraminal stenosis  -Start Qulipta  60 mg daily for migraine prevention, monitor for BP, higher in the office than home - PCP has prescribed Lyrica for headache, may try for acute headache - Feels Imitrex  increased his BP - On chronic Percocet, Flexeril , Ativan   - Follow-up with [facility], already an established pain patient to see if candidate for possible injection for neck pain - Sleep study did not show any sleep apnea - Previously tried and failed: Worried about injection side effect of Emgality , PT, Propranolol (fatigue), gabapentin , Topamax, contraindicated due to kidney stones, already on Lexapro  with intolerance to other antidepressants, Nurtec and Ubrelvy  were not helpful - Next steps: consider Lamictal  or Depakote for headache prevention - Follow up in 6 months   DIAGNOSTIC DATA (LABS, IMAGING, TESTING) - I reviewed patient records, labs, notes, testing and imaging myself where available.   MEDICAL HISTORY:   [name] Raether. is a 63 year old male, seen in request by his psychiatrist Dr. Curry Paterson for evaluation of constellation of complaints, body tremor, worsening anxiety, he is accompanied by his primary care physician Dr. Regino, [place], initial evaluation was on June 19, 2021 with his wife   I reviewed and summarized the referring note. PMHx. HTN Depression  anxiety, since 2021,  only taking ativan  0.5mg  tid prn. Cervical decompression surgery,  Adrenal gland nodule,  Intestine surgery in 2018 Pain management for chronic neck and low back pain  He reported history of depression since 2011, went on disability since then, also due to his previous cervical decompression surgery for cervical radiculopathy,  His depression has worsened since abdominal surgery in 2018, initially for adrenal gland surgery, complicated by intestinal injury per patient, required multiple abdominal surgery later  He was diagnosed with depression anxiety, over the years, he was treated with different medications, most recent one was Trintellix , around April 2022, he noticed intermittent body tremor, worsening anxiety, could not keep himself calm,  Trintellix  was stopped, now he is taking Ativan  0.5 mg 3-4 times daily even more each day, is no longer helpful, he described difficulty keeping calm,   UPDATE January 01 2023: He is alone at today's clinical visit, continues to complains of depression anxiety, constellation of complaints, not sleeping well, feeling anxious,  History of neck surgery for left cervical radiculopathy in 2009, he fell off a truck, landed on his left shoulder, since then, he has frequent headache  Increased frequency of headache, starting from the base of the scalp, spreading forward, sometimes throbbing, lasting for hours to days, denies light noise sensitivity, denies nausea,  He has poor sleep quality, often complains of daytime sleepiness, fatigue  Personally reviewed MRI of the brain September 2022, mild small vessel disease no acute abnormality  Laboratory evaluation from November 05, 2022, A1c 5.1, LDL 78 vitamin D 19, normal TSH 0.86, normal CMP creatinine 0.81 CBC hemoglobin of 13.1, B12 359, within normal range PSA,  Update Mar 02, 2024 SS: Last visit with Dr. Onita MRI cervical spine ordered, referral for sleep consult, Imitrex  as needed.  ESR,  CRP were normal. PCP ordered MRI brain with and without contrast 02/25/24 that was unremarkable.  Never had sleep consult or MRI cervical spine, didn't want to pursue.   Reports headache in the morning, gets worse as he gets going. Right sided, behind the eye, radiates to right occipital area. Feels like throbbing, pressure, aching, makes him feel very anxious. Few weeks ago he was given Levitra, the next day he had headache, has only taken twice, last was 3 weeks ago. Will take Imitrex  for headache with good benefit, gets # 9 tablets a month.  In the past with other ED medication caused headaches. Taking tylenol  daily. Has been taking Imitrex  50 mg 1/2-1 tablet daily for the last 2 weeks.   Mentions he snores, gets up once at night to urinate, has anxiety, gets up a 4 AM. Normally good energy. Takes naps in afternoon. He is retired, on disability for neck surgery.   Update July 19, 2024 SS: Nurtec denied by insurance, Ubrelvy  was approved. Cancelled his appointment with Dr. Onita in June. Dr. Buck recommended sleep study in June, [place] showed no sleep apnea. 2 days ago woke up with right eye pain, turned into migraine, took Imitrex , took the edge off, continues with headache all day. Yesterday continued with dull, moderate headache. Got worse last night, took Tylenol , no benefit. At the moment continues with pain to top of head frontal, radiates back to occipital area. Last night he took, Percocet, Flexeril . This is typical headache pattern for him.  1 migraine a week, can last 3 days. His BP was high today, normal when he is in the doctors office, at home runs in the 130's. Right now 5/10 headache, would like a Toradol  shot today, worries he won't be able to sleep tonight due to headache.  Update 10/18/24 SS: MRI cervical spine Oct 2025,  Post-operative ACDF C3-C6. At C6-7 posterior osteophytic spurring resulting in severe bilateral foraminal stenosis, slightly progressed since 2016. He never started the  Emgality , his anxiety was too high to start it. Went to PT for neck pain, didn't help much, still having headaches. Goes to [facility] for pain management. Headaches are usually left sided, base of skull, neck, radiates forward, pressure. Takes Imitrex  without much benefit. Headache can last 5-6 days. Has been given Lyrica but hasn't taken.   PHYSICAL EXAM:   Vitals:   10/18/24 1044 10/18/24 1049  BP: (!) 167/108 (!) 164/99  Pulse: 95   Weight: 210 lb (95.3 kg)   Height: 5' 10.5 (1.791 m)     Body mass index is 29.71 kg/m.  PHYSICAL EXAMNIATION:  Physical Exam  General: The patient is alert and cooperative at the time of the examination.  Skin: No significant peripheral edema is noted.  Neurologic Exam  Mental status: The patient is alert and oriented x 3 at the time of the examination. The patient has apparent normal recent and remote memory, with an apparently normal attention span and concentration ability.  Cranial nerves: Facial symmetry is present. Speech is normal, no aphasia or dysarthria is noted. Extraocular movements are full. Visual fields are full.  Motor: The patient has good strength in all 4 extremities.  Sensory examination: Soft touch sensation is symmetric on the face, arms, and legs.  Coordination: The patient has good finger-nose-finger and heel-to-shin bilaterally.  Gait and station: The patient has a normal gait.  Reflexes: Deep tendon reflexes are symmetric  REVIEW OF SYSTEMS:  Full 14 system review of systems performed and notable only for as above All other review of systems were negative.  ALLERGIES: Allergies  Allergen Reactions   Citalopram     Other Reaction(s): Headaches, Headaches    Hydrocodone -Acetaminophen      Other Reaction(s): Itching   Hydroxyzine      Other Reaction(s): Headaches   Sertraline     Other Reaction(s): Jittery   Topiramate     Other Reaction(s): Sexual Dysfuntion   Venlafaxine     Other  Reaction(s): Tremors   Morphine  Itching and Other (See Comments)    Other Reaction(s): Itching    Penicillins Rash and Other (See Comments)    Has patient had a PCN reaction causing immediate rash, facial/tongue/throat swelling, SOB or lightheadedness with hypotension: Yes  Has patient had a PCN reaction causing severe rash involving mucus membranes or skin necrosis: Unknown  Has patient had a PCN reaction that required hospitalization: No  Has patient had a PCN reaction occurring within the last 10 years: No  If all of the above answers are NO, then may proceed with Cephalosporin use.  Tolerating Zosyn  05/2017   Varenicline Palpitations    Heart race  Increased panic attacks  Other Reaction(s): Tachycardia    HOME MEDICATIONS: Current Outpatient Medications  Medication Sig Dispense Refill   amLODipine  (NORVASC ) 5 MG tablet Take 5 mg by mouth daily.     Atogepant  (QULIPTA ) 60 MG TABS Take 1 tablet (60 mg total) by mouth daily. 30 tablet 11   betamethasone  dipropionate 0.05 % cream Apply 1 application  topically 2 (two) times daily as needed (rash).     cyclobenzaprine  (FLEXERIL ) 10 MG tablet Take 0.5-1 tablets (5-10 mg total) by mouth 2 (two) times daily as needed for muscle spasms. 20 tablet 0   escitalopram  (LEXAPRO ) 5 MG tablet Take 0.5-1 tablets (2.5-5 mg total) by mouth daily. 30 tablet 0   famotidine  (PEPCID ) 40 MG tablet Take 20 mg by mouth 2 (two) times daily.     ibuprofen  (ADVIL ) 200 MG tablet Take 600 mg by mouth every 6 (six) hours as needed for moderate pain.     LORazepam  (ATIVAN ) 1 MG tablet Take 1 tablet (1 mg total) by mouth 2 (two) times daily as needed. for anxiety 60 tablet 2   Multiple Vitamin (MULTIVITAMIN WITH MINERALS) TABS Take 1 tablet by mouth daily.     ondansetron  (ZOFRAN ) 4 MG tablet Take 1 tablet (4 mg total) by mouth every 6 (six) hours. 12 tablet 0   oxyCODONE -acetaminophen  (PERCOCET) 10-325 MG tablet Take 1 tablet by mouth every 6 (six) hours as  needed for pain.     Probiotic Product (PROBIOTIC PO) Take 1 capsule by mouth daily.     psyllium (METAMUCIL) 58.6 % packet Take 1 packet by mouth every other day.     sildenafil (VIAGRA) 100 MG tablet Take 100 mg by mouth daily as needed for erectile dysfunction.     SUMAtriptan  (IMITREX ) 50 MG tablet Take 1 tablet (50 mg total) by mouth every 2 (two) hours as needed. May repeat in 2 hours if headache persists or recurs. 10 tablet 6   tamsulosin  (FLOMAX ) 0.4 MG CAPS capsule Take 1 capsule (0.4 mg total) by mouth daily. 10 capsule 0   telmisartan (MICARDIS) 40 MG tablet Take 40 mg by mouth daily.     traZODone  (DESYREL ) 50 MG tablet Take 0.5-1 tablets (25-50 mg total) by mouth at  bedtime. 30 tablet 1   No current facility-administered medications for this visit.    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 06/27/2017   Throat and mouth symptom    itchy throat x 3 weeks   Thyroid  nodule    Tobacco abuse    Wears partial dentures    upper    PAST SURGICAL HISTORY: Past Surgical History:  Procedure Laterality Date   ABDOMINAL SURGERY     ANTERIOR CERVICAL DECOMP/DISCECTOMY FUSION N/A 03/18/2013   Procedure: ANTERIOR CERVICAL DECOMPRESSION/DISCECTOMY FUSION 3 LEVELS;  Surgeon: Fairy Levels, MD;  Location: [facility];  Service: Neurosurgery;  Laterality: N/A;  Anterior Cervical Three-Six  Decompression/Diskectomy/Fusion   APPENDECTOMY     CIRCUMCISION   05/04/2006   COLONOSCOPY     CYSTOSCOPY/URETEROSCOPY/HOLMIUM LASER/STENT PLACEMENT Left 11/24/2018   Procedure: CYSTOSCOPY/URETEROSCOPY/HOLMIUM LASER/STENT PLACEMENT;  Surgeon: Carolee Sherwood JONETTA DOUGLAS, MD;  Location: [facility];  Service: Urology;  Laterality: Left;   DECOMPRESSIVE LUMBAR LAMINECTOMY LEVEL 1 Right 01/11/2020   Procedure: RIGHT LUMBAR FOUR-FIVE MINIMALLY INVASIVE LUMBAR DISCECTOMY;  Surgeon: Cheryle Debby LABOR, MD;  Location: [facility];  Service: Neurosurgery;  Laterality: Right;   EYE SURGERY     bilateral cataract surgery    HEMICOLECTOMY Right 04/13/2000   with ileocolic anastomosis   INGUINAL HERNIA REPAIR     INSERTION OF MESH N/A 07/31/2017   Procedure: INSERTION OF MESH;  Surgeon: Gladis Cough, MD;  Location: [facility];  Service: General;  Laterality: N/A;   KNEE ARTHROSCOPY Left 08/01/2013   Procedure: LEFT ARTHROSCOPY KNEE;  Surgeon: Dempsey JINNY Sensor, MD;  Location: [facility];  Service: Orthopedics;  Laterality: Left;   LAPAROSCOPIC ADRENALECTOMY Right 05/06/2017   Procedure: OPEN RIGHT ADRENALECTOMY WITH SMALL BOWEL RESECTION AND ANASTOMOSIS;  Surgeon: Stevie, Herlene Righter, MD;  Location: [facility];  Service: General;  Laterality: Right;   LAPAROSCOPIC LYSIS OF ADHESIONS N/A 07/31/2017   Procedure: LYSIS OF ADHESIONS;  Surgeon: Gladis Cough, MD;  Location: [facility];  Service: General;  Laterality: N/A;   LAPAROSCOPY N/A 07/31/2017   Procedure: LAPAROSCOPY DIAGNOSTIC;  Surgeon: Gladis Cough, MD;  Location: [facility];  Service: General;  Laterality: N/A;   LAPAROTOMY N/A 05/13/2017   Procedure: EXPLORATORY LAPAROTOMY, with wound dehisense, and repair of anastamotic leak;  Surgeon: Stevie Herlene Righter, MD;  Location: [facility];  Service: General;  Laterality: N/A;   LAPAROTOMY N/A 05/14/2017   Procedure: EXPLORATORY LAPAROTOMY, LYSIS OF ADHESIONS/ SMALL BOWEL RESECTION/ WASHOUT OF ABDOMEN AND PLACEMENT OF NEGATIVE PRESSURE DRESSING;  Surgeon:  Stevie, Herlene Righter, MD;  Location: [facility];  Service: General;  Laterality: N/A;   LAPAROTOMY N/A 05/17/2017   Procedure: EXPLORATORY LAPAROTOMY, PLACEMENT OF ILEOSTOMY TUBES, PARTIAL CLOSURE OF FASCIA, WOUND VAC EXCHANGE;  Surgeon: [name], Herlene Righter, MD;  Location: [facility];  Service: General;  Laterality: N/A;   LAPAROTOMY  N/A 05/20/2017   Procedure: ABDOMINAL WOUND WASH OUT, WOUND CLOSURE AND WOUND VAC PLACEMENT;  Surgeon: [name], Herlene Righter, MD;  Location: [facility];  Service: General;  Laterality: N/A;   LAPAROTOMY N/A 07/31/2017   Procedure: EXPLORATORY LAPAROTOMY WITH CLOSURE OF ENTEROTOMICS;  Surgeon: Gladis Cough, MD;  Location: [facility];  Service: General;  Laterality: N/A;   SHOULDER ARTHROSCOPY W/ ROTATOR CUFF REPAIR Left 07/16/2010   SMALL INTESTINE SURGERY  04/24/2000   adhesiolysis, ileocolic anastomosis   UPPER GASTROINTESTINAL ENDOSCOPY      FAMILY HISTORY: Family History  Problem Relation Age of Onset   Colon cancer Father 32   Breast cancer Sister 49   Thyroid  disease Neg Hx    Sleep apnea Neg Hx     SOCIAL HISTORY: Social History   Socioeconomic History   Marital status: Married    Spouse name: Not on file   Number of children: Not on file   Years of education: Not on file   Highest education level: Not on file  Occupational History   Not on file  Tobacco Use   Smoking status: Former    Current packs/day: 0.00    Average packs/day: 1 pack/day for 25.0 years (25.0 ttl pk-yrs)    Types: Cigarettes    Start date: 26    Quit date: 2017    Years since quitting: 8.9   Smokeless tobacco: Never   Tobacco comments:    quit smoking 3 years ago  Vaping Use   Vaping status: Never Used  Substance and Sexual Activity   Alcohol use: Yes    Comment: occasionally   Drug use: No   Sexual activity: Not on file  Other Topics Concern   Not on file  Social History Narrative   Lives in [place] with his wife.   Right handed   Caffeine : none   Social Drivers of  Health   Tobacco Use: Medium Risk (10/18/2024)   Patient History    Smoking Tobacco Use: Former    Smokeless Tobacco Use: Never    Passive Exposure: Not on Actuary Strain: Not on file  Food Insecurity: Not on file  Transportation Needs: Not on file  Physical Activity: Not on file  Stress: Not on file  Social Connections: Not on file  Intimate Partner Violence: Not on file  Depression (EYV7-0): Not on file  Alcohol Screen: Not on file  Housing: Not on file  Utilities: Not on file  Health Literacy: Not on file   Lauraine Born, SCHARLENE, DNP  [facility] [address], [address] [place], [place] [address] [phone]  "

## 2024-11-01 ENCOUNTER — Telehealth (HOSPITAL_COMMUNITY): Admitting: Psychiatry

## 2024-11-01 ENCOUNTER — Telehealth (HOSPITAL_COMMUNITY): Payer: Self-pay | Admitting: *Deleted

## 2024-11-01 ENCOUNTER — Encounter (HOSPITAL_COMMUNITY): Payer: Self-pay | Admitting: Psychiatry

## 2024-11-01 VITALS — Wt 210.0 lb

## 2024-11-01 DIAGNOSIS — F411 Generalized anxiety disorder: Secondary | ICD-10-CM

## 2024-11-01 DIAGNOSIS — F4310 Post-traumatic stress disorder, unspecified: Secondary | ICD-10-CM

## 2024-11-01 DIAGNOSIS — F410 Panic disorder [episodic paroxysmal anxiety] without agoraphobia: Secondary | ICD-10-CM

## 2024-11-01 MED ORDER — ESCITALOPRAM OXALATE 5 MG PO TABS: 2.5000 mg | ORAL_TABLET | Freq: Every day | ORAL | 2 refills | Status: AC

## 2024-11-01 MED ORDER — TRAZODONE HCL 50 MG PO TABS: 25.0000 mg | ORAL_TABLET | Freq: Every day | ORAL | 1 refills | Status: AC

## 2024-11-01 MED ORDER — LORAZEPAM 1 MG PO TABS: 1.0000 mg | ORAL_TABLET | Freq: Two times a day (BID) | ORAL | 2 refills | Status: AC | PRN

## 2024-11-01 MED ORDER — AMITRIPTYLINE HCL 10 MG PO TABS: 10.0000 mg | ORAL_TABLET | Freq: Every day | ORAL | 0 refills | Status: AC

## 2024-11-01 NOTE — Telephone Encounter (Signed)
 PA submitted to [facility] via the Cover My Meds portal for Lorazepam  1 mg tablets. Requested expedited review as pt says he is very close to being out of medication and is getting very anxious.  Awaiting determination.

## 2024-11-01 NOTE — Progress Notes (Signed)
 " [facility] MD Virtual Progress Note   Patient Location: In Car  Provider Location: Home Office  I connect with patient by video and verified that I am speaking with correct person by using two identifiers. I discussed the limitations of evaluation and management by telemedicine and the availability of in person appointments. I also discussed with the patient that there may be a patient responsible charge related to this service. The patient expressed understanding and agreed to proceed.  [name] Prill Sr. [id number] 64 y.o.  11/01/2024 11:45 AM  History of Present Illness:  Patient is evaluated by video session.  He reported things are going okay until started new year feeling sad and not doing very well.  He did not specify any reason but feeling down and isolated and withdrawn.  He reported Christmas was good.  He recently had a visit with neurology and he was given Qulipta  for headaches but he told his blood pressure increases and he is no longer taking it.  He is frustrated with his headache.  He still sometimes struggle with brain fog but that is not as bad.  He is more concerned about his back pain and headaches.  He is taking a very low-dose Lexapro  along with Ativan  and low-dose trazodone .  He is not seeing Harlene because he felt it was not working very well for him.  Patient has multiple neurological issues including headaches, tremors, chronic pain along with hypertension and GERD.  He is also thinking to get nerve block for his chronic pain because sometimes it bothers him a lot and he does not sleep well.  His tremors are not as bad.  He denies any hallucination, paranoia, agitation, suicidal thoughts.  He like to try something to help his headaches.  Patient told his doctor recommend to consider antidepressant for the insomnia and headaches but he would like to consult with this writer first.  He denies any agitation, active or passive suicidal thoughts.  He believe he  gained weight since the last visit but it is unchanged from the past.  Past Psychiatric History: H/O anxiety since early 37s.  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  No h/o mania, psychosis, suicidal attempt, inpatient treatment.  Seen and managed by PCP since early 30s. Tried Remeron , hydroxyzine , gabapentin , Paxil, Prozac, Zoloft, Cymbalta , Klonopin  and Lamictal .  Trintellix  helped but caused tremors.  Pristiq  could not tolerate more than 25 mg.  Viibryd  cause diarrhea. Gene-Sight shows Cymbalta , mirtazapine , Paxil, Luvox, clozapine, olanzapine , thiothixene has significant drug interaction.  Viibryd  caused weight loss.    Outpatient Encounter Medications as of 11/01/2024  Medication Sig   amLODipine  (NORVASC ) 5 MG tablet Take 5 mg by mouth daily.   Atogepant  (QULIPTA ) 60 MG TABS Take 1 tablet (60 mg total) by mouth daily.   betamethasone  dipropionate 0.05 % cream Apply 1 application  topically 2 (two) times daily as needed (rash).   cyclobenzaprine  (FLEXERIL ) 10 MG tablet Take 0.5-1 tablets (5-10 mg total) by mouth 2 (two) times daily as needed for muscle spasms.   escitalopram  (LEXAPRO ) 5 MG tablet Take 0.5-1 tablets (2.5-5 mg total) by mouth daily.   famotidine  (PEPCID ) 40 MG tablet Take 20 mg by mouth 2 (two) times daily.   ibuprofen  (ADVIL ) 200 MG tablet Take 600 mg by mouth every 6 (six) hours as needed for moderate pain.   LORazepam  (ATIVAN ) 1 MG tablet Take 1 tablet (1 mg total) by mouth 2 (two) times  daily as needed. for anxiety   Multiple Vitamin (MULTIVITAMIN WITH MINERALS) TABS Take 1 tablet by mouth daily.   ondansetron  (ZOFRAN ) 4 MG tablet Take 1 tablet (4 mg total) by mouth every 6 (six) hours.   oxyCODONE -acetaminophen  (PERCOCET) 10-325 MG tablet Take 1 tablet by mouth every 6 (six) hours as needed for pain.   Probiotic Product (PROBIOTIC PO) Take 1 capsule by mouth daily.   psyllium (METAMUCIL) 58.6 % packet Take 1 packet by mouth every other day.    sildenafil (VIAGRA) 100 MG tablet Take 100 mg by mouth daily as needed for erectile dysfunction.   SUMAtriptan  (IMITREX ) 50 MG tablet Take 1 tablet (50 mg total) by mouth every 2 (two) hours as needed. May repeat in 2 hours if headache persists or recurs.   tamsulosin  (FLOMAX ) 0.4 MG CAPS capsule Take 1 capsule (0.4 mg total) by mouth daily.   telmisartan (MICARDIS) 40 MG tablet Take 40 mg by mouth daily.   traZODone  (DESYREL ) 50 MG tablet Take 0.5-1 tablets (25-50 mg total) by mouth at bedtime.   [DISCONTINUED] simethicone  (MYLICON) 80 MG chewable tablet Chew 80 mg by mouth every 6 (six) hours as needed for flatulence.   No facility-administered encounter medications on file as of 11/01/2024.    No results found for this or any previous visit (from the past 2160 hours).   Psychiatric Specialty Exam: Physical Exam  Review of Systems  Musculoskeletal:  Positive for back pain and neck pain.  Neurological:  Positive for headaches.    Weight 210 lb (95.3 kg).There is no height or weight on file to calculate BMI.  General Appearance: Casual  Eye Contact:  Good  Speech:  Normal Rate  Volume:  Normal  Mood:  Anxious and Dysphoric  Affect:  Appropriate  Thought Process:  Goal Directed  Orientation:  Full (Time, Place, and Person)  Thought Content:  Rumination  Suicidal Thoughts:  No  Homicidal Thoughts:  No  Memory:  Immediate;   Good Recent;   Good Remote;   Good  Judgement:  Good  Insight:  Present  Psychomotor Activity:  Normal  Concentration:  Concentration: Good and Attention Span: Good  Recall:  Good  Fund of Knowledge:  Good  Language:  Good  Akathisia:  No  Handed:  Right  AIMS (if indicated):     Assets:  Communication Skills Desire for Improvement Housing Social Support Transportation  ADL's:  Intact  Cognition:  WNL  Sleep:  ok with Trazodone        06/17/2021   11:51 AM 07/22/2013   10:03 AM  Depression screen PHQ 2/9  Decreased Interest 1 0  Down, Depressed,  Hopeless 1 0  PHQ - 2 Score 2 0  Altered sleeping 1   Tired, decreased energy 3   Change in appetite 0   Feeling bad or failure about yourself  0   Trouble concentrating 1   Moving slowly or fidgety/restless 0   Suicidal thoughts 0   PHQ-9 Score 7    Difficult doing work/chores Not difficult at all      Data saved with a previous flowsheet row definition    Assessment/Plan: Generalized anxiety disorder - Plan: escitalopram  (LEXAPRO ) 5 MG tablet, LORazepam  (ATIVAN ) 1 MG tablet, traZODone  (DESYREL ) 50 MG tablet, amitriptyline  (ELAVIL ) 10 MG tablet  Panic attack - Plan: escitalopram  (LEXAPRO ) 5 MG tablet, LORazepam  (ATIVAN ) 1 MG tablet, amitriptyline  (ELAVIL ) 10 MG tablet  PTSD (post-traumatic stress disorder) - Plan: escitalopram  (LEXAPRO ) 5 MG tablet, amitriptyline  (ELAVIL )  10 MG tablet  Patient is 64 year old man with history of lumbar radiculopathy, chronic back pain, headaches, hypertension, COPD, GERD, tremors, panic attack, PTSD and generalized anxiety disorder.  Discussed chronic anxiety and recently noticed mood is low and down.  Unclear the reason.  I also reviewed collateral information from other provider including neurology.  He is no longer taking Qulipta  because of the concern of high blood pressure.  He stopped the medicine.  Now he like to try something from our office to help the headaches.  As per neurology, the next step for headache management is either Lamictal  or Depakote however he had taken Lamictal  in the past and that did not work.  After discussing the Depakote side effects specially weight gain patient is not interested because he has back pain and does not want to gain more weight.  Recommended trial low-dose amitriptyline  to help with his insomnia, headaches and it can also help with anxiety.  He is willing to try.  Will start 10 mg however I explained he need to contact his neurology for the management of his headaches if that did not help.  I also recommend therapy  but patient does not want to go back to [place].  I have provided information of [facility] and patient will contact them.  Provided phone number [phone] and address [address].  Continue Ativan  1 mg twice a day, Lexapro  2.5 mg daily, trazodone  25 mg at bedtime.  Discussed medication side effects and benefits.  Recommend to call back if is any question or any concern.  Follow-up in 3 months.  Follow Up Instructions:     I discussed the assessment and treatment plan with the patient. The patient was provided an opportunity to ask questions and all were answered. The patient agreed with the plan and demonstrated an understanding of the instructions.   The patient was advised to call back or seek an in-person evaluation if the symptoms worsen or if the condition fails to improve as anticipated.    Collaboration of Care: Other provider involved in patient's care AEB will contact PCP to get his records including blood work results.  Patient/Guardian was advised Release of Information must be obtained prior to any record release in order to collaborate their care with an outside provider. Patient/Guardian was advised if they have not already done so to contact the registration department to sign all necessary forms in order for us  to release information regarding their care.   Consent: Patient/Guardian gives verbal consent for treatment and assignment of benefits for services provided during this visit. Patient/Guardian expressed understanding and agreed to proceed.     Total encounter time 30 minutes which includes face-to-face time, chart reviewed, care coordination, order entry and documentation during this encounter.   Note: This document was prepared by [employer] voice dictation technology and any errors that results from this process are unintentional.    Leni ONEIDA Client, MD 11/01/2024   "

## 2024-11-02 ENCOUNTER — Telehealth (HOSPITAL_COMMUNITY): Payer: Self-pay | Admitting: *Deleted

## 2024-11-02 NOTE — Telephone Encounter (Signed)
 PA for Lorazepam  1 mg tablets has been APPROVED by [employer]. Authorized from 10/27/24 through 10/26/25. Pt advised.

## 2025-01-30 ENCOUNTER — Telehealth (HOSPITAL_COMMUNITY): Admitting: Psychiatry
